# Patient Record
Sex: Female | Born: 1940 | Race: White | Hispanic: No | Smoking: Never smoker
Health system: Southern US, Community
[De-identification: ages and names within clinical notes are randomized; demographics above are authoritative.]

## PROBLEM LIST (undated history)

## (undated) DIAGNOSIS — K5900 Constipation, unspecified: Secondary | ICD-10-CM

## (undated) DIAGNOSIS — R319 Hematuria, unspecified: Secondary | ICD-10-CM

## (undated) DIAGNOSIS — C801 Malignant (primary) neoplasm, unspecified: Secondary | ICD-10-CM

## (undated) DIAGNOSIS — N390 Urinary tract infection, site not specified: Secondary | ICD-10-CM

## (undated) DIAGNOSIS — K317 Polyp of stomach and duodenum: Secondary | ICD-10-CM

## (undated) DIAGNOSIS — R51 Headache: Secondary | ICD-10-CM

## (undated) DIAGNOSIS — K219 Gastro-esophageal reflux disease without esophagitis: Secondary | ICD-10-CM

## (undated) DIAGNOSIS — I471 Supraventricular tachycardia, unspecified: Secondary | ICD-10-CM

## (undated) DIAGNOSIS — K3184 Gastroparesis: Secondary | ICD-10-CM

## (undated) DIAGNOSIS — E559 Vitamin D deficiency, unspecified: Secondary | ICD-10-CM

## (undated) DIAGNOSIS — F419 Anxiety disorder, unspecified: Secondary | ICD-10-CM

## (undated) DIAGNOSIS — I639 Cerebral infarction, unspecified: Secondary | ICD-10-CM

## (undated) DIAGNOSIS — R109 Unspecified abdominal pain: Secondary | ICD-10-CM

## (undated) DIAGNOSIS — D509 Iron deficiency anemia, unspecified: Secondary | ICD-10-CM

## (undated) DIAGNOSIS — E1169 Type 2 diabetes mellitus with other specified complication: Secondary | ICD-10-CM

## (undated) DIAGNOSIS — C50919 Malignant neoplasm of unspecified site of unspecified female breast: Secondary | ICD-10-CM

## (undated) DIAGNOSIS — I10 Essential (primary) hypertension: Secondary | ICD-10-CM

## (undated) DIAGNOSIS — E7800 Pure hypercholesterolemia, unspecified: Secondary | ICD-10-CM

## (undated) DIAGNOSIS — M26609 Unspecified temporomandibular joint disorder, unspecified side: Secondary | ICD-10-CM

## (undated) DIAGNOSIS — R112 Nausea with vomiting, unspecified: Secondary | ICD-10-CM

## (undated) DIAGNOSIS — R011 Cardiac murmur, unspecified: Secondary | ICD-10-CM

## (undated) DIAGNOSIS — M542 Cervicalgia: Secondary | ICD-10-CM

## (undated) DIAGNOSIS — K909 Intestinal malabsorption, unspecified: Secondary | ICD-10-CM

## (undated) DIAGNOSIS — Z9889 Other specified postprocedural states: Secondary | ICD-10-CM

## (undated) DIAGNOSIS — N189 Chronic kidney disease, unspecified: Secondary | ICD-10-CM

## (undated) DIAGNOSIS — Z0001 Encounter for general adult medical examination with abnormal findings: Secondary | ICD-10-CM

## (undated) DIAGNOSIS — D3A092 Benign carcinoid tumor of the stomach: Secondary | ICD-10-CM

## (undated) DIAGNOSIS — M7989 Other specified soft tissue disorders: Secondary | ICD-10-CM

## (undated) DIAGNOSIS — D649 Anemia, unspecified: Secondary | ICD-10-CM

## (undated) DIAGNOSIS — E119 Type 2 diabetes mellitus without complications: Secondary | ICD-10-CM

## (undated) DIAGNOSIS — G609 Hereditary and idiopathic neuropathy, unspecified: Secondary | ICD-10-CM

## (undated) DIAGNOSIS — E669 Obesity, unspecified: Secondary | ICD-10-CM

## (undated) DIAGNOSIS — E1142 Type 2 diabetes mellitus with diabetic polyneuropathy: Secondary | ICD-10-CM

## (undated) DIAGNOSIS — M1990 Unspecified osteoarthritis, unspecified site: Secondary | ICD-10-CM

## (undated) HISTORY — DX: Type 2 diabetes mellitus without complications: E11.9

## (undated) HISTORY — DX: Headache: R51

## (undated) HISTORY — DX: Encounter for general adult medical examination with abnormal findings: Z00.01

## (undated) HISTORY — DX: Pure hypercholesterolemia, unspecified: E78.00

## (undated) HISTORY — DX: Gastro-esophageal reflux disease without esophagitis: K21.9

## (undated) HISTORY — DX: Polyp of stomach and duodenum: K31.7

## (undated) HISTORY — DX: Vitamin D deficiency, unspecified: E55.9

## (undated) HISTORY — DX: Anxiety disorder, unspecified: F41.9

## (undated) HISTORY — DX: Supraventricular tachycardia, unspecified: I47.10

## (undated) HISTORY — DX: Unspecified temporomandibular joint disorder, unspecified side: M26.609

## (undated) HISTORY — DX: Obesity, unspecified: E66.9

## (undated) HISTORY — DX: Unspecified osteoarthritis, unspecified site: M19.90

## (undated) HISTORY — DX: Anemia, unspecified: D64.9

## (undated) HISTORY — PX: ESOPHAGOGASTRODUODENOSCOPY: SHX1529

## (undated) HISTORY — DX: Iron deficiency anemia, unspecified: D50.9

## (undated) HISTORY — DX: Cardiac murmur, unspecified: R01.1

## (undated) HISTORY — DX: Cervicalgia: M54.2

## (undated) HISTORY — PX: TONSILLECTOMY: SHX5217

## (undated) HISTORY — DX: Benign carcinoid tumor of the stomach: D3A.092

## (undated) HISTORY — DX: Constipation, unspecified: K59.00

## (undated) HISTORY — DX: Essential (primary) hypertension: I10

## (undated) HISTORY — DX: Supraventricular tachycardia: I47.1

## (undated) HISTORY — DX: Cerebral infarction, unspecified: I63.9

## (undated) HISTORY — DX: Hematuria, unspecified: R31.9

## (undated) HISTORY — DX: Gastroparesis: K31.84

## (undated) HISTORY — DX: Malignant neoplasm of unspecified site of unspecified female breast: C50.919

## (undated) HISTORY — PX: EYE SURGERY: SHX253

## (undated) HISTORY — DX: Urinary tract infection, site not specified: N39.0

## (undated) HISTORY — DX: Type 2 diabetes mellitus with diabetic polyneuropathy: E11.42

## (undated) HISTORY — DX: Unspecified abdominal pain: R10.9

## (undated) HISTORY — DX: Type 2 diabetes mellitus with other specified complication: E11.69

## (undated) HISTORY — DX: Hereditary and idiopathic neuropathy, unspecified: G60.9

## (undated) HISTORY — DX: Intestinal malabsorption, unspecified: K90.9

---

## 1991-02-07 HISTORY — PX: CHOLECYSTECTOMY: SHX55

## 2000-08-30 ENCOUNTER — Encounter: Payer: Self-pay | Admitting: Internal Medicine

## 2000-08-30 LAB — HM COLONOSCOPY

## 2000-08-31 ENCOUNTER — Encounter: Payer: Self-pay | Admitting: Internal Medicine

## 2003-09-03 ENCOUNTER — Encounter: Admission: RE | Admit: 2003-09-03 | Discharge: 2003-09-03 | Payer: Self-pay | Admitting: Obstetrics and Gynecology

## 2003-12-14 ENCOUNTER — Ambulatory Visit: Payer: Self-pay | Admitting: Internal Medicine

## 2004-01-28 ENCOUNTER — Ambulatory Visit: Payer: Self-pay | Admitting: Endocrinology

## 2004-02-11 ENCOUNTER — Encounter: Admission: RE | Admit: 2004-02-11 | Discharge: 2004-02-11 | Payer: Self-pay | Admitting: Obstetrics and Gynecology

## 2004-02-18 ENCOUNTER — Other Ambulatory Visit: Admission: RE | Admit: 2004-02-18 | Discharge: 2004-02-18 | Payer: Self-pay | Admitting: Obstetrics and Gynecology

## 2004-03-30 ENCOUNTER — Ambulatory Visit: Payer: Self-pay | Admitting: Internal Medicine

## 2004-04-14 ENCOUNTER — Ambulatory Visit: Payer: Self-pay | Admitting: Internal Medicine

## 2004-06-16 ENCOUNTER — Ambulatory Visit: Payer: Self-pay | Admitting: Endocrinology

## 2004-07-01 ENCOUNTER — Ambulatory Visit: Payer: Self-pay | Admitting: Internal Medicine

## 2004-07-21 ENCOUNTER — Ambulatory Visit: Payer: Self-pay | Admitting: Internal Medicine

## 2005-01-13 ENCOUNTER — Ambulatory Visit: Payer: Self-pay | Admitting: Endocrinology

## 2005-02-16 ENCOUNTER — Encounter: Admission: RE | Admit: 2005-02-16 | Discharge: 2005-02-16 | Payer: Self-pay | Admitting: Internal Medicine

## 2005-02-24 ENCOUNTER — Ambulatory Visit: Payer: Self-pay | Admitting: Endocrinology

## 2005-03-02 ENCOUNTER — Ambulatory Visit: Payer: Self-pay | Admitting: Endocrinology

## 2005-03-23 ENCOUNTER — Ambulatory Visit: Payer: Self-pay

## 2005-04-07 ENCOUNTER — Other Ambulatory Visit: Admission: RE | Admit: 2005-04-07 | Discharge: 2005-04-07 | Payer: Self-pay | Admitting: Obstetrics and Gynecology

## 2005-05-25 ENCOUNTER — Ambulatory Visit: Payer: Self-pay | Admitting: Endocrinology

## 2005-06-01 ENCOUNTER — Ambulatory Visit: Payer: Self-pay | Admitting: Endocrinology

## 2005-06-08 ENCOUNTER — Ambulatory Visit: Payer: Self-pay | Admitting: Internal Medicine

## 2005-10-10 ENCOUNTER — Ambulatory Visit: Payer: Self-pay | Admitting: Endocrinology

## 2005-10-16 ENCOUNTER — Encounter: Admission: RE | Admit: 2005-10-16 | Discharge: 2005-10-16 | Payer: Self-pay | Admitting: Endocrinology

## 2005-11-16 ENCOUNTER — Ambulatory Visit: Payer: Self-pay | Admitting: Endocrinology

## 2005-11-16 LAB — CONVERTED CEMR LAB
Cholesterol: 183 mg/dL (ref 0–200)
LDL Cholesterol: 110 mg/dL — ABNORMAL HIGH (ref 0–99)

## 2005-11-23 ENCOUNTER — Ambulatory Visit: Payer: Self-pay | Admitting: Endocrinology

## 2005-12-05 ENCOUNTER — Ambulatory Visit: Payer: Self-pay

## 2006-02-19 ENCOUNTER — Encounter: Admission: RE | Admit: 2006-02-19 | Discharge: 2006-02-19 | Payer: Self-pay | Admitting: Endocrinology

## 2006-02-23 ENCOUNTER — Ambulatory Visit: Payer: Self-pay | Admitting: Endocrinology

## 2006-02-23 LAB — CONVERTED CEMR LAB
Albumin: 3.6 g/dL (ref 3.5–5.2)
Alkaline Phosphatase: 60 units/L (ref 39–117)
Cholesterol: 180 mg/dL (ref 0–200)
Creatinine, Ser: 0.6 mg/dL (ref 0.4–1.2)
Crystals: NEGATIVE
Eosinophils Relative: 1.7 % (ref 0.0–5.0)
GFR calc non Af Amer: 107 mL/min
Glucose, Bld: 134 mg/dL — ABNORMAL HIGH (ref 70–99)
HCT: 39.3 % (ref 36.0–46.0)
Hemoglobin: 12.7 g/dL (ref 12.0–15.0)
Ketones, ur: NEGATIVE mg/dL
LDL Cholesterol: 98 mg/dL (ref 0–99)
Lymphocytes Relative: 32.2 % (ref 12.0–46.0)
MCV: 83.7 fL (ref 78.0–100.0)
Monocytes Absolute: 0.3 10*3/uL (ref 0.2–0.7)
Mucus, UA: NEGATIVE
Neutrophils Relative %: 61.1 % (ref 43.0–77.0)
Total Protein: 6.6 g/dL (ref 6.0–8.3)
Urine Glucose: NEGATIVE mg/dL
VLDL: 18 mg/dL (ref 0–40)
WBC: 6.7 10*3/uL (ref 4.5–10.5)
pH: 8 (ref 5.0–8.0)

## 2006-03-02 ENCOUNTER — Ambulatory Visit: Payer: Self-pay | Admitting: Endocrinology

## 2006-03-28 ENCOUNTER — Ambulatory Visit: Payer: Self-pay | Admitting: Endocrinology

## 2006-03-30 ENCOUNTER — Encounter
Admission: RE | Admit: 2006-03-30 | Discharge: 2006-06-28 | Payer: Self-pay | Admitting: Physical Medicine & Rehabilitation

## 2006-03-30 ENCOUNTER — Ambulatory Visit: Payer: Self-pay | Admitting: Physical Medicine & Rehabilitation

## 2006-04-10 ENCOUNTER — Encounter
Admission: RE | Admit: 2006-04-10 | Discharge: 2006-06-28 | Payer: Self-pay | Admitting: Physical Medicine & Rehabilitation

## 2006-05-11 ENCOUNTER — Ambulatory Visit: Payer: Self-pay | Admitting: Endocrinology

## 2006-05-11 LAB — CONVERTED CEMR LAB
ALT: 32 units/L (ref 0–40)
AST: 28 units/L (ref 0–37)
Alkaline Phosphatase: 55 units/L (ref 39–117)
Direct LDL: 195.3 mg/dL
Hgb A1c MFr Bld: 6.5 % — ABNORMAL HIGH (ref 4.6–6.0)
Total CHOL/HDL Ratio: 4.4
Total Protein: 6.9 g/dL (ref 6.0–8.3)

## 2006-05-24 ENCOUNTER — Ambulatory Visit: Payer: Self-pay | Admitting: Endocrinology

## 2006-07-18 ENCOUNTER — Encounter: Admission: RE | Admit: 2006-07-18 | Discharge: 2006-07-18 | Payer: Self-pay | Admitting: Orthopedic Surgery

## 2006-08-20 ENCOUNTER — Ambulatory Visit: Payer: Self-pay | Admitting: Endocrinology

## 2006-08-20 LAB — CONVERTED CEMR LAB
Albumin: 3.5 g/dL (ref 3.5–5.2)
Alkaline Phosphatase: 56 units/L (ref 39–117)
Bilirubin, Direct: 0.2 mg/dL (ref 0.0–0.3)
HDL: 63.8 mg/dL (ref >39.0)
Hgb A1c MFr Bld: 6.5 % — ABNORMAL HIGH (ref 4.6–6.0)
Total CHOL/HDL Ratio: 2.9
Total Protein: 6.6 g/dL (ref 6.0–8.3)
VLDL: 21 mg/dL (ref 0–40)

## 2006-08-27 ENCOUNTER — Ambulatory Visit: Payer: Self-pay | Admitting: Endocrinology

## 2006-09-05 ENCOUNTER — Encounter: Payer: Self-pay | Admitting: Endocrinology

## 2006-09-05 DIAGNOSIS — E1169 Type 2 diabetes mellitus with other specified complication: Secondary | ICD-10-CM

## 2006-09-05 DIAGNOSIS — I10 Essential (primary) hypertension: Secondary | ICD-10-CM | POA: Insufficient documentation

## 2006-09-05 DIAGNOSIS — E669 Obesity, unspecified: Secondary | ICD-10-CM | POA: Insufficient documentation

## 2006-09-05 DIAGNOSIS — E119 Type 2 diabetes mellitus without complications: Secondary | ICD-10-CM | POA: Insufficient documentation

## 2006-09-05 HISTORY — DX: Type 2 diabetes mellitus with other specified complication: E66.9

## 2006-09-05 HISTORY — DX: Type 2 diabetes mellitus with other specified complication: E11.69

## 2006-11-13 ENCOUNTER — Ambulatory Visit: Payer: Self-pay | Admitting: Endocrinology

## 2006-11-13 LAB — CONVERTED CEMR LAB: Hgb A1c MFr Bld: 6.5 % — ABNORMAL HIGH (ref 4.6–6.0)

## 2006-11-19 ENCOUNTER — Ambulatory Visit: Payer: Self-pay | Admitting: Endocrinology

## 2006-11-26 ENCOUNTER — Telehealth (INDEPENDENT_AMBULATORY_CARE_PROVIDER_SITE_OTHER): Payer: Self-pay | Admitting: *Deleted

## 2006-12-05 ENCOUNTER — Telehealth (INDEPENDENT_AMBULATORY_CARE_PROVIDER_SITE_OTHER): Payer: Self-pay | Admitting: *Deleted

## 2007-03-04 ENCOUNTER — Ambulatory Visit: Payer: Self-pay | Admitting: Endocrinology

## 2007-03-04 LAB — CONVERTED CEMR LAB
AST: 25 units/L (ref 0–37)
Bilirubin, Direct: 0.2 mg/dL (ref 0.0–0.3)
CO2: 30 meq/L (ref 19–32)
Calcium: 9.6 mg/dL (ref 8.4–10.5)
Chloride: 100 meq/L (ref 96–112)
Creatinine,U: 119.1 mg/dL
Eosinophils Absolute: 0.1 10*3/uL (ref 0.0–0.6)
Eosinophils Relative: 1.3 % (ref 0.0–5.0)
GFR calc Af Amer: 129 mL/min
GFR calc non Af Amer: 107 mL/min
Glucose, Bld: 126 mg/dL — ABNORMAL HIGH (ref 70–99)
HCT: 36.7 % (ref 36.0–46.0)
HDL: 60.3 mg/dL (ref >39.0)
Hemoglobin: 12.2 g/dL (ref 12.0–15.0)
LDL Cholesterol: 122 mg/dL — ABNORMAL HIGH (ref 0–99)
Lymphocytes Relative: 17.7 % (ref 12.0–46.0)
MCV: 80.8 fL (ref 78.0–100.0)
Monocytes Absolute: 0.8 10*3/uL — ABNORMAL HIGH (ref 0.2–0.7)
Monocytes Relative: 7 % (ref 3.0–11.0)
Neutrophils Relative %: 73.7 % (ref 43.0–77.0)
Platelets: 225 10*3/uL (ref 150–400)
RBC: 4.54 M/uL (ref 3.87–5.11)
Sodium: 138 meq/L (ref 135–145)
Total Bilirubin: 1.2 mg/dL (ref 0.3–1.2)
Total Protein: 6.6 g/dL (ref 6.0–8.3)
Triglycerides: 68 mg/dL (ref 0–149)
VLDL: 14 mg/dL (ref 0–40)
WBC: 11.3 10*3/uL — ABNORMAL HIGH (ref 4.5–10.5)

## 2007-03-07 ENCOUNTER — Encounter: Admission: RE | Admit: 2007-03-07 | Discharge: 2007-03-07 | Payer: Self-pay | Admitting: Obstetrics and Gynecology

## 2007-03-13 ENCOUNTER — Encounter: Admission: RE | Admit: 2007-03-13 | Discharge: 2007-03-13 | Payer: Self-pay | Admitting: Obstetrics and Gynecology

## 2007-03-13 ENCOUNTER — Ambulatory Visit: Payer: Self-pay | Admitting: Endocrinology

## 2007-04-01 ENCOUNTER — Encounter: Payer: Self-pay | Admitting: Endocrinology

## 2007-04-16 ENCOUNTER — Ambulatory Visit: Payer: Self-pay | Admitting: Internal Medicine

## 2007-04-24 ENCOUNTER — Encounter: Payer: Self-pay | Admitting: Endocrinology

## 2007-04-24 ENCOUNTER — Telehealth (INDEPENDENT_AMBULATORY_CARE_PROVIDER_SITE_OTHER): Payer: Self-pay | Admitting: *Deleted

## 2007-05-03 ENCOUNTER — Telehealth (INDEPENDENT_AMBULATORY_CARE_PROVIDER_SITE_OTHER): Payer: Self-pay | Admitting: *Deleted

## 2007-06-11 ENCOUNTER — Ambulatory Visit: Payer: Self-pay | Admitting: Endocrinology

## 2007-06-11 DIAGNOSIS — E7800 Pure hypercholesterolemia, unspecified: Secondary | ICD-10-CM

## 2007-06-11 DIAGNOSIS — R519 Headache, unspecified: Secondary | ICD-10-CM | POA: Insufficient documentation

## 2007-06-11 DIAGNOSIS — R51 Headache: Secondary | ICD-10-CM

## 2007-06-12 LAB — CONVERTED CEMR LAB
ALT: 26 units/L (ref 0–35)
AST: 35 units/L (ref 0–37)
Albumin: 3.7 g/dL (ref 3.5–5.2)
Alkaline Phosphatase: 58 units/L (ref 39–117)
Cholesterol: 184 mg/dL (ref 0–200)
Total Bilirubin: 1.1 mg/dL (ref 0.3–1.2)

## 2007-10-28 ENCOUNTER — Telehealth (INDEPENDENT_AMBULATORY_CARE_PROVIDER_SITE_OTHER): Payer: Self-pay | Admitting: *Deleted

## 2007-11-04 ENCOUNTER — Telehealth (INDEPENDENT_AMBULATORY_CARE_PROVIDER_SITE_OTHER): Payer: Self-pay | Admitting: *Deleted

## 2007-11-19 ENCOUNTER — Ambulatory Visit: Payer: Self-pay | Admitting: Endocrinology

## 2007-12-10 ENCOUNTER — Ambulatory Visit: Payer: Self-pay | Admitting: Endocrinology

## 2007-12-10 DIAGNOSIS — R110 Nausea: Secondary | ICD-10-CM | POA: Insufficient documentation

## 2007-12-13 LAB — CONVERTED CEMR LAB
Albumin: 3.9 g/dL (ref 3.5–5.2)
Amylase: 122 units/L (ref 27–131)
Basophils Absolute: 0 10*3/uL (ref 0.0–0.1)
Bilirubin, Direct: 0.2 mg/dL (ref 0.0–0.3)
CO2: 28 meq/L (ref 19–32)
Chloride: 102 meq/L (ref 96–112)
Creatinine, Ser: 0.6 mg/dL (ref 0.4–1.2)
GFR calc Af Amer: 129 mL/min
GFR calc non Af Amer: 106 mL/min
Glucose, Bld: 78 mg/dL (ref 70–99)
Hemoglobin: 11.7 g/dL — ABNORMAL LOW (ref 12.0–15.0)
Lymphocytes Relative: 34.6 % (ref 12.0–46.0)
MCHC: 32.8 g/dL (ref 30.0–36.0)
MCV: 79.7 fL (ref 78.0–100.0)
Monocytes Relative: 7.8 % (ref 3.0–12.0)
Platelets: 242 10*3/uL (ref 150–400)
Potassium: 3.7 meq/L (ref 3.5–5.1)
Sodium: 139 meq/L (ref 135–145)
Total Bilirubin: 1.1 mg/dL (ref 0.3–1.2)
Total Protein: 7.2 g/dL (ref 6.0–8.3)

## 2007-12-16 ENCOUNTER — Ambulatory Visit: Payer: Self-pay | Admitting: Endocrinology

## 2007-12-16 DIAGNOSIS — D649 Anemia, unspecified: Secondary | ICD-10-CM

## 2007-12-16 DIAGNOSIS — M79609 Pain in unspecified limb: Secondary | ICD-10-CM

## 2007-12-16 LAB — CONVERTED CEMR LAB
Eosinophils Relative: 1.6 % (ref 0.0–5.0)
Folate: >20 ng/mL
Hemoglobin: 11.9 g/dL — ABNORMAL LOW (ref 12.0–15.0)
Lymphocytes Relative: 40.4 % (ref 12.0–46.0)
MCV: 77.8 fL — ABNORMAL LOW (ref 78.0–100.0)
Monocytes Absolute: 0.3 10*3/uL (ref 0.1–1.0)
Neutrophils Relative %: 53.5 % (ref 43.0–77.0)
RDW: 13.8 % (ref 11.5–14.6)
Saturation Ratios: 4 % — ABNORMAL LOW (ref 20.0–50.0)
Transferrin: 379 mg/dL — ABNORMAL HIGH (ref >=212.0)

## 2007-12-17 ENCOUNTER — Encounter: Payer: Self-pay | Admitting: Endocrinology

## 2007-12-18 ENCOUNTER — Ambulatory Visit: Payer: Self-pay | Admitting: Internal Medicine

## 2007-12-18 DIAGNOSIS — D509 Iron deficiency anemia, unspecified: Secondary | ICD-10-CM

## 2007-12-18 DIAGNOSIS — K3184 Gastroparesis: Secondary | ICD-10-CM

## 2007-12-19 ENCOUNTER — Telehealth: Payer: Self-pay | Admitting: Internal Medicine

## 2007-12-20 ENCOUNTER — Ambulatory Visit: Payer: Self-pay | Admitting: Internal Medicine

## 2007-12-24 ENCOUNTER — Encounter: Payer: Self-pay | Admitting: Endocrinology

## 2008-03-09 ENCOUNTER — Ambulatory Visit: Payer: Self-pay | Admitting: Endocrinology

## 2008-03-09 LAB — CONVERTED CEMR LAB
Basophils Absolute: 0.1 10*3/uL (ref 0.0–0.1)
Eosinophils Absolute: 0.1 10*3/uL (ref 0.0–0.7)
Hemoglobin: 12.1 g/dL (ref 12.0–15.0)
Hgb A1c MFr Bld: 7 % — ABNORMAL HIGH (ref 4.6–6.0)
Iron: 28 ug/dL — ABNORMAL LOW (ref 42–145)
Lymphocytes Relative: 30.3 % (ref 12.0–46.0)
MCHC: 33.2 g/dL (ref 30.0–36.0)
Neutro Abs: 4.7 10*3/uL (ref 1.4–7.7)
Platelets: 271 10*3/uL (ref 150–400)
RDW: 14.1 % (ref 11.5–14.6)
WBC: 7.9 10*3/uL (ref 4.5–10.5)

## 2008-03-16 ENCOUNTER — Encounter: Admission: RE | Admit: 2008-03-16 | Discharge: 2008-03-16 | Payer: Self-pay | Admitting: Obstetrics and Gynecology

## 2008-05-12 ENCOUNTER — Encounter: Payer: Self-pay | Admitting: Endocrinology

## 2008-05-18 ENCOUNTER — Encounter: Payer: Self-pay | Admitting: Cardiology

## 2008-05-18 ENCOUNTER — Ambulatory Visit: Payer: Self-pay | Admitting: Cardiology

## 2008-05-18 DIAGNOSIS — R079 Chest pain, unspecified: Secondary | ICD-10-CM

## 2008-05-22 ENCOUNTER — Telehealth: Payer: Self-pay | Admitting: Endocrinology

## 2008-06-08 ENCOUNTER — Ambulatory Visit: Payer: Self-pay | Admitting: Endocrinology

## 2008-06-08 DIAGNOSIS — Z780 Asymptomatic menopausal state: Secondary | ICD-10-CM | POA: Insufficient documentation

## 2008-06-08 DIAGNOSIS — M722 Plantar fascial fibromatosis: Secondary | ICD-10-CM | POA: Insufficient documentation

## 2008-06-09 LAB — CONVERTED CEMR LAB: Sed Rate: 19 mm/hr (ref 0–22)

## 2008-06-11 ENCOUNTER — Telehealth (INDEPENDENT_AMBULATORY_CARE_PROVIDER_SITE_OTHER): Payer: Self-pay | Admitting: *Deleted

## 2008-06-14 LAB — CONVERTED CEMR LAB
ALT: 31 units/L (ref 0–35)
Albumin: 3.8 g/dL (ref 3.5–5.2)
BUN: 15 mg/dL (ref 6–23)
Basophils Relative: 1.3 % (ref 0.0–3.0)
Bilirubin Urine: NEGATIVE
Creatinine,U: 27.2 mg/dL
Eosinophils Absolute: 0.1 10*3/uL (ref 0.0–0.7)
Eosinophils Relative: 1.6 % (ref 0.0–5.0)
GFR calc non Af Amer: 105.92 mL/min (ref >60)
HCT: 34.1 % — ABNORMAL LOW (ref 36.0–46.0)
HDL: 64.4 mg/dL (ref >39.00)
Hemoglobin, Urine: NEGATIVE
Hemoglobin: 11.4 g/dL — ABNORMAL LOW (ref 12.0–15.0)
Iron: 26 ug/dL — ABNORMAL LOW (ref 42–145)
Leukocytes, UA: NEGATIVE
Lymphs Abs: 2.6 10*3/uL (ref 0.7–4.0)
MCHC: 33.4 g/dL (ref 30.0–36.0)
Microalb, Ur: 0.2 mg/dL (ref 0.0–1.9)
Monocytes Absolute: 0.7 10*3/uL (ref 0.1–1.0)
Monocytes Relative: 9.9 % (ref 3.0–12.0)
Platelets: 224 10*3/uL (ref 150.0–400.0)
Potassium: 4.4 meq/L (ref 3.5–5.1)
RBC: 4.43 M/uL (ref 3.87–5.11)
RDW: 14.7 % — ABNORMAL HIGH (ref 11.5–14.6)
Specific Gravity, Urine: <=1.005 (ref 1.000–1.030)
TSH: 1.8 microintl units/mL (ref 0.35–5.50)
Total Protein, Urine: NEGATIVE mg/dL
Total Protein: 7.2 g/dL (ref 6.0–8.3)
Transferrin: 382.1 mg/dL — ABNORMAL HIGH (ref 212.0–360.0)
Urine Glucose: NEGATIVE mg/dL
VLDL: 26.8 mg/dL (ref 0.0–40.0)

## 2008-06-17 ENCOUNTER — Telehealth: Payer: Self-pay | Admitting: Endocrinology

## 2008-06-17 ENCOUNTER — Telehealth (INDEPENDENT_AMBULATORY_CARE_PROVIDER_SITE_OTHER): Payer: Self-pay | Admitting: *Deleted

## 2008-06-18 ENCOUNTER — Encounter (INDEPENDENT_AMBULATORY_CARE_PROVIDER_SITE_OTHER): Payer: Self-pay | Admitting: *Deleted

## 2008-09-10 ENCOUNTER — Encounter: Payer: Self-pay | Admitting: Endocrinology

## 2008-09-14 ENCOUNTER — Ambulatory Visit: Payer: Self-pay | Admitting: Endocrinology

## 2008-09-14 DIAGNOSIS — R609 Edema, unspecified: Secondary | ICD-10-CM

## 2008-09-14 LAB — CONVERTED CEMR LAB
Basophils Relative: 7 % — ABNORMAL HIGH (ref 0.0–3.0)
Eosinophils Relative: 2.2 % (ref 0.0–5.0)
Lymphocytes Relative: 33.8 % (ref 12.0–46.0)
MCHC: 33.3 g/dL (ref 30.0–36.0)
Monocytes Relative: 9.5 % (ref 3.0–12.0)
Neutro Abs: 3.8 10*3/uL (ref 1.4–7.7)
Neutrophils Relative %: 47.5 % (ref 43.0–77.0)
Pro B Natriuretic peptide (BNP): 34 pg/mL (ref 0.0–100.0)
RBC: 4.32 M/uL (ref 3.87–5.11)
Transferrin: 340.9 mg/dL (ref 212.0–360.0)

## 2008-10-05 ENCOUNTER — Telehealth (INDEPENDENT_AMBULATORY_CARE_PROVIDER_SITE_OTHER): Payer: Self-pay | Admitting: *Deleted

## 2008-10-07 ENCOUNTER — Encounter: Payer: Self-pay | Admitting: Endocrinology

## 2008-11-04 ENCOUNTER — Ambulatory Visit: Payer: Self-pay | Admitting: Endocrinology

## 2008-12-07 ENCOUNTER — Telehealth: Payer: Self-pay | Admitting: Endocrinology

## 2008-12-09 ENCOUNTER — Telehealth: Payer: Self-pay | Admitting: Endocrinology

## 2008-12-14 ENCOUNTER — Ambulatory Visit: Payer: Self-pay | Admitting: Endocrinology

## 2008-12-14 LAB — CONVERTED CEMR LAB
Eosinophils Absolute: 0.1 10*3/uL (ref 0.0–0.7)
Eosinophils Relative: 1.7 % (ref 0.0–5.0)
Folate: >20 ng/mL
HCT: 37.2 % (ref 36.0–46.0)
Lymphocytes Relative: 35.5 % (ref 12.0–46.0)
Lymphs Abs: 3 10*3/uL (ref 0.7–4.0)
MCHC: 34.2 g/dL (ref 30.0–36.0)
MCV: 82.5 fL (ref 78.0–100.0)
Monocytes Absolute: 0.6 10*3/uL (ref 0.1–1.0)
Monocytes Relative: 7.5 % (ref 3.0–12.0)
Neutro Abs: 4.7 10*3/uL (ref 1.4–7.7)
Neutrophils Relative %: 54.7 % (ref 43.0–77.0)
RBC: 4.5 M/uL (ref 3.87–5.11)
RDW: 13.6 % (ref 11.5–14.6)
Transferrin: 371.5 mg/dL — ABNORMAL HIGH (ref 212.0–360.0)

## 2008-12-25 ENCOUNTER — Telehealth (INDEPENDENT_AMBULATORY_CARE_PROVIDER_SITE_OTHER): Payer: Self-pay | Admitting: *Deleted

## 2009-01-08 ENCOUNTER — Telehealth: Payer: Self-pay | Admitting: Endocrinology

## 2009-01-10 ENCOUNTER — Encounter: Payer: Self-pay | Admitting: Cardiology

## 2009-01-10 ENCOUNTER — Emergency Department (HOSPITAL_COMMUNITY): Admission: EM | Admit: 2009-01-10 | Discharge: 2009-01-11 | Payer: Self-pay | Admitting: Emergency Medicine

## 2009-01-10 ENCOUNTER — Encounter: Payer: Self-pay | Admitting: Endocrinology

## 2009-01-11 ENCOUNTER — Telehealth: Payer: Self-pay | Admitting: Cardiology

## 2009-01-12 ENCOUNTER — Ambulatory Visit: Payer: Self-pay | Admitting: Endocrinology

## 2009-01-12 ENCOUNTER — Telehealth (INDEPENDENT_AMBULATORY_CARE_PROVIDER_SITE_OTHER): Payer: Self-pay | Admitting: *Deleted

## 2009-01-12 DIAGNOSIS — I471 Supraventricular tachycardia: Secondary | ICD-10-CM | POA: Insufficient documentation

## 2009-01-21 ENCOUNTER — Telehealth: Payer: Self-pay | Admitting: Endocrinology

## 2009-02-23 ENCOUNTER — Telehealth (INDEPENDENT_AMBULATORY_CARE_PROVIDER_SITE_OTHER): Payer: Self-pay | Admitting: *Deleted

## 2009-02-25 ENCOUNTER — Encounter (HOSPITAL_COMMUNITY): Admission: RE | Admit: 2009-02-25 | Discharge: 2009-05-14 | Payer: Self-pay | Admitting: Endocrinology

## 2009-02-25 ENCOUNTER — Ambulatory Visit: Payer: Self-pay | Admitting: Cardiovascular Disease

## 2009-02-25 ENCOUNTER — Ambulatory Visit: Payer: Self-pay

## 2009-03-02 ENCOUNTER — Ambulatory Visit: Payer: Self-pay | Admitting: Cardiology

## 2009-03-02 DIAGNOSIS — R011 Cardiac murmur, unspecified: Secondary | ICD-10-CM

## 2009-03-09 ENCOUNTER — Encounter: Payer: Self-pay | Admitting: Endocrinology

## 2009-03-15 ENCOUNTER — Encounter: Payer: Self-pay | Admitting: Endocrinology

## 2009-03-17 ENCOUNTER — Ambulatory Visit: Payer: Self-pay | Admitting: Endocrinology

## 2009-03-17 DIAGNOSIS — M2550 Pain in unspecified joint: Secondary | ICD-10-CM

## 2009-03-17 DIAGNOSIS — M25552 Pain in left hip: Secondary | ICD-10-CM | POA: Insufficient documentation

## 2009-03-17 LAB — CONVERTED CEMR LAB
Hgb A1c MFr Bld: 6.8 % — ABNORMAL HIGH (ref 4.6–6.5)
Rhuematoid fact SerPl-aCnc: <20 intl units/mL (ref 0.0–20.0)
Uric Acid, Serum: 4.4 mg/dL (ref 2.4–7.0)

## 2009-03-18 ENCOUNTER — Ambulatory Visit (HOSPITAL_COMMUNITY): Admission: RE | Admit: 2009-03-18 | Discharge: 2009-03-18 | Payer: Self-pay | Admitting: Cardiology

## 2009-03-18 ENCOUNTER — Ambulatory Visit: Payer: Self-pay | Admitting: Cardiology

## 2009-03-18 ENCOUNTER — Encounter: Payer: Self-pay | Admitting: Cardiology

## 2009-03-18 ENCOUNTER — Ambulatory Visit: Payer: Self-pay

## 2009-03-24 ENCOUNTER — Encounter: Admission: RE | Admit: 2009-03-24 | Discharge: 2009-03-24 | Payer: Self-pay | Admitting: Obstetrics and Gynecology

## 2009-05-20 ENCOUNTER — Encounter: Payer: Self-pay | Admitting: Endocrinology

## 2009-06-28 ENCOUNTER — Ambulatory Visit: Payer: Self-pay | Admitting: Endocrinology

## 2009-06-28 LAB — CONVERTED CEMR LAB
AST: 31 units/L (ref 0–37)
Alkaline Phosphatase: 57 units/L (ref 39–117)
Basophils Absolute: 0 10*3/uL (ref 0.0–0.1)
Basophils Relative: 0.5 % (ref 0.0–3.0)
Chloride: 96 meq/L (ref 96–112)
Cholesterol: 173 mg/dL (ref 0–200)
Creatinine,U: 37.1 mg/dL
Eosinophils Absolute: 0.1 10*3/uL (ref 0.0–0.7)
Eosinophils Relative: 1.1 % (ref 0.0–5.0)
Glucose, Bld: 89 mg/dL (ref 70–99)
Hgb A1c MFr Bld: 6.6 % — ABNORMAL HIGH (ref 4.6–6.5)
Ketones, ur: NEGATIVE mg/dL
Lymphocytes Relative: 34.3 % (ref 12.0–46.0)
MCV: 82.5 fL (ref 78.0–100.0)
Microalb Creat Ratio: 0.8 mg/g (ref 0.0–30.0)
Neutrophils Relative %: 54.9 % (ref 43.0–77.0)
Nitrite: NEGATIVE
Potassium: 3.8 meq/L (ref 3.5–5.1)
RBC: 4.24 M/uL (ref 3.87–5.11)
Saturation Ratios: 9.1 % — ABNORMAL LOW (ref 20.0–50.0)
Specific Gravity, Urine: 1.01 (ref 1.000–1.030)
TSH: 1.13 microintl units/mL (ref 0.35–5.50)
Total Protein, Urine: NEGATIVE mg/dL
Total Protein: 6.8 g/dL (ref 6.0–8.3)
Triglycerides: 98 mg/dL (ref 0.0–149.0)
Urine Glucose: NEGATIVE mg/dL

## 2009-09-01 ENCOUNTER — Encounter: Payer: Self-pay | Admitting: Cardiology

## 2009-09-16 ENCOUNTER — Ambulatory Visit: Payer: Self-pay | Admitting: Endocrinology

## 2009-09-16 LAB — CONVERTED CEMR LAB
ALT: 35 units/L (ref 0–35)
AST: 37 units/L (ref 0–37)
Basophils Absolute: 0 10*3/uL (ref 0.0–0.1)
Calcium: 9.5 mg/dL (ref 8.4–10.5)
Creatinine, Ser: 0.6 mg/dL (ref 0.4–1.2)
Glucose, Urine, Semiquant: NEGATIVE
Ketones, urine, test strip: NEGATIVE
MCV: 83.7 fL (ref 78.0–100.0)
Monocytes Absolute: 0.6 10*3/uL (ref 0.1–1.0)
Monocytes Relative: 8.8 % (ref 3.0–12.0)
Neutrophils Relative %: 57.9 % (ref 43.0–77.0)
Platelets: 223 10*3/uL (ref 150.0–400.0)
Protein, U semiquant: NEGATIVE
Sodium: 135 meq/L (ref 135–145)
Specific Gravity, Urine: 1.005
Total Bilirubin: 0.8 mg/dL (ref 0.3–1.2)
Transferrin: 335.7 mg/dL (ref 212.0–360.0)
WBC: 6.3 10*3/uL (ref 4.5–10.5)

## 2009-09-17 ENCOUNTER — Ambulatory Visit: Payer: Self-pay | Admitting: Cardiology

## 2009-09-22 ENCOUNTER — Encounter: Admission: RE | Admit: 2009-09-22 | Discharge: 2009-09-22 | Payer: Self-pay | Admitting: Endocrinology

## 2009-10-04 ENCOUNTER — Encounter: Payer: Self-pay | Admitting: Endocrinology

## 2009-11-10 ENCOUNTER — Ambulatory Visit: Payer: Self-pay | Admitting: Endocrinology

## 2009-11-25 ENCOUNTER — Telehealth: Payer: Self-pay | Admitting: Endocrinology

## 2009-11-26 ENCOUNTER — Telehealth: Payer: Self-pay | Admitting: Endocrinology

## 2009-11-29 ENCOUNTER — Telehealth: Payer: Self-pay | Admitting: Endocrinology

## 2009-12-09 ENCOUNTER — Encounter: Payer: Self-pay | Admitting: Endocrinology

## 2009-12-09 ENCOUNTER — Telehealth (INDEPENDENT_AMBULATORY_CARE_PROVIDER_SITE_OTHER): Payer: Self-pay | Admitting: *Deleted

## 2009-12-10 ENCOUNTER — Telehealth: Payer: Self-pay | Admitting: Endocrinology

## 2009-12-16 ENCOUNTER — Ambulatory Visit: Payer: Self-pay | Admitting: Endocrinology

## 2009-12-16 LAB — CONVERTED CEMR LAB: Hgb A1c MFr Bld: 6.9 % — ABNORMAL HIGH (ref 4.6–6.5)

## 2009-12-18 ENCOUNTER — Encounter: Payer: Self-pay | Admitting: Endocrinology

## 2010-01-10 ENCOUNTER — Ambulatory Visit: Payer: Self-pay | Admitting: Endocrinology

## 2010-02-14 ENCOUNTER — Encounter (INDEPENDENT_AMBULATORY_CARE_PROVIDER_SITE_OTHER): Payer: Self-pay | Admitting: *Deleted

## 2010-02-26 ENCOUNTER — Other Ambulatory Visit: Payer: Self-pay | Admitting: Obstetrics and Gynecology

## 2010-02-26 DIAGNOSIS — Z1239 Encounter for other screening for malignant neoplasm of breast: Secondary | ICD-10-CM

## 2010-03-02 ENCOUNTER — Encounter: Payer: Self-pay | Admitting: Endocrinology

## 2010-03-08 ENCOUNTER — Other Ambulatory Visit: Payer: Self-pay | Admitting: Endocrinology

## 2010-03-08 ENCOUNTER — Ambulatory Visit
Admission: RE | Admit: 2010-03-08 | Discharge: 2010-03-08 | Payer: Self-pay | Source: Home / Self Care | Attending: Endocrinology | Admitting: Endocrinology

## 2010-03-08 DIAGNOSIS — R1013 Epigastric pain: Secondary | ICD-10-CM

## 2010-03-08 DIAGNOSIS — K3189 Other diseases of stomach and duodenum: Secondary | ICD-10-CM | POA: Insufficient documentation

## 2010-03-08 LAB — HEPATIC FUNCTION PANEL
AST: 27 U/L (ref 0–37)
Alkaline Phosphatase: 62 U/L (ref 39–117)
Bilirubin, Direct: 0.1 mg/dL (ref 0.0–0.3)
Total Bilirubin: 0.6 mg/dL (ref 0.3–1.2)

## 2010-03-08 LAB — LIPID PANEL
Cholesterol: 150 mg/dL (ref 0–200)
HDL: 57.4 mg/dL (ref >39.00)
LDL Cholesterol: 72 mg/dL (ref 0–99)
Total CHOL/HDL Ratio: 3
Triglycerides: 102 mg/dL (ref 0.0–149.0)
VLDL: 20.4 mg/dL (ref 0.0–40.0)

## 2010-03-08 LAB — CBC WITH DIFFERENTIAL/PLATELET
Basophils Absolute: 0 10*3/uL (ref 0.0–0.1)
Eosinophils Relative: 0.7 % (ref 0.0–5.0)
HCT: 34.3 % — ABNORMAL LOW (ref 36.0–46.0)
Hemoglobin: 11.6 g/dL — ABNORMAL LOW (ref 12.0–15.0)
Lymphocytes Relative: 31.1 % (ref 12.0–46.0)
Lymphs Abs: 2.2 10*3/uL (ref 0.7–4.0)
MCV: 81.6 fl (ref 78.0–100.0)
Monocytes Absolute: 0.7 10*3/uL (ref 0.1–1.0)
Neutrophils Relative %: 57.6 % (ref 43.0–77.0)
Platelets: 215 10*3/uL (ref 150.0–400.0)
RBC: 4.21 Mil/uL (ref 3.87–5.11)
RDW: 14.2 % (ref 11.5–14.6)
WBC: 7 10*3/uL (ref 4.5–10.5)

## 2010-03-08 LAB — HEMOGLOBIN A1C: Hgb A1c MFr Bld: 6.7 % — ABNORMAL HIGH (ref 4.6–6.5)

## 2010-03-09 ENCOUNTER — Telehealth: Payer: Self-pay | Admitting: Endocrinology

## 2010-03-09 NOTE — Miscellaneous (Signed)
Clinical Lists Changes  Observations: Added new observation of ECHOINTERP:  Left ventricle: The cavity size was normal. Wall thickness was       normal. Systolic function was normal. The estimated ejection       fraction was in the range of 60% to 65%. Wall motion was normal;       there were no regional wall motion abnormalities. Doppler       parameters are consistent with abnormal left ventricular       relaxation (grade 1 diastolic dysfunction).     - Aortic valve: Sclerosis without stenosis. Mean gradient: 9mm Hg       (S).     - Mitral valve: Trivial regurgitation.     - Left atrium: The atrium was mildly dilated.     - Right ventricle: The cavity size was normal. Systolic function was       normal.     - Pulmonary arteries: PA peak pressure: 34mm Hg (S).     - Inferior vena cava: The vessel was normal in size; the       respirophasic diameter changes were in the normal range (= 50%);       findings are consistent with normal central venous pressure.     Impressions:            - Normal LV size and systolic function, EF 60-65%. Murmur may be due       to aortic sclerosis. No significant valvular abnormality. (03/18/2009 15:54) Added new observation of NUCLEAR NOS: Exercise Capacity: Lexiscan BP Response: Normal blood pressure response. Clinical Symptoms: No chest pain ECG Impression: No significant ST segment change suggestive of ischemia. Overall Impression: Normal stress nuclear study. Overall Impression Comments: normal  (02/25/2009 15:54)      Echocardiogram  Procedure date:  03/18/2009  Findings:       Left ventricle: The cavity size was normal. Wall thickness was       normal. Systolic function was normal. The estimated ejection       fraction was in the range of 60% to 65%. Wall motion was normal;       there were no regional wall motion abnormalities. Doppler       parameters are consistent with abnormal left ventricular       relaxation (grade 1 diastolic  dysfunction).     - Aortic valve: Sclerosis without stenosis. Mean gradient: 9mm Hg       (S).     - Mitral valve: Trivial regurgitation.     - Left atrium: The atrium was mildly dilated.     - Right ventricle: The cavity size was normal. Systolic function was       normal.     - Pulmonary arteries: PA peak pressure: 34mm Hg (S).     - Inferior vena cava: The vessel was normal in size; the       respirophasic diameter changes were in the normal range (= 50%);       findings are consistent with normal central venous pressure.     Impressions:            - Normal LV size and systolic function, EF 60-65%. Murmur may be due       to aortic sclerosis. No significant valvular abnormality.  Nuclear Study  Procedure date:  02/25/2009  Findings:      Exercise Capacity: Lexiscan BP Response: Normal blood pressure response. Clinical Symptoms: No chest pain ECG Impression: No significant ST  segment change suggestive of ischemia. Overall Impression: Normal stress nuclear study. Overall Impression Comments: normal

## 2010-03-09 NOTE — Assessment & Plan Note (Signed)
Summary: per pt 3 mth fu---stc   Vital Signs:  Patient profile:   70 year old female Height:      62 inches (157.48 cm) Weight:      177.25 pounds (80.57 kg) BMI:     32.54 O2 Sat:      97 % on Room air Temp:     98.7 degrees F (37.06 degrees C) oral Pulse rate:   79 / minute BP sitting:   124 / 72  (left arm) Cuff size:   large  Vitals Entered By: Brenton Grills CMA Duncan Dull) (December 16, 2009 1:15 PM)  O2 Flow:  Room air CC: Follow-up visit/pt is no longer taking Alprazolam or Ultram and is not taking Losartan Postassim-HCTZ/aj Is Patient Diabetic? Yes   Primary Provider:  Erick Alley, MD  CC:  Follow-up visit/pt is no longer taking Alprazolam or Ultram and is not taking Losartan Postassim-HCTZ/aj.  History of Present Illness: pt takes and tolerates diovan-hct.  she does not want to take losartan-hct.  i have reviewed the prior auth form with her.  ins will not pay for the diovan-hct.    Current Medications (verified): 1)  Foltx 2.5-25-2 Mg  Tabs (Fa-Pyridoxine-Cyancobalamin) .... Take 1 By Mouth Qd 2)  Multivitamins   Tabs (Multiple Vitamin) .... Take 1 By Mouth Qd 3)  Reglan 5 Mg Tabs (Metoclopramide Hcl) .... Qac and Qhs 4)  Nexium 40 Mg  Cpdr (Esomeprazole Magnesium) .... Qd 5)  Ascensia Contour Test  Strp (Glucose Blood) .... Qid, and Lancets 250.00 6)  Lipitor 80 Mg Tabs (Atorvastatin Calcium) .... 1/2 Qd 7)  Metformin Hcl 500 Mg Xr24h-Tab (Metformin Hcl) .... 2-Bid 8)  Asprin 81mg  .... One Daily 9)  Vitamin E .... Daily 10)  Vision Formula .... Daily 11)  Celebrex 200 Mg Caps (Celecoxib) .Marland Kitchen.. 1 Daily 12)  Alprazolam 0.5 Mg Tabs (Alprazolam) .Marland Kitchen.. 1 Three Times A Day As Needed Anxiety 13)  Ultram 50 Mg Tabs (Tramadol Hcl) .Marland Kitchen.. 1 Q4h As Needed Headache 14)  Losartan Potassium-Hctz 100-25 Mg Tabs (Losartan Potassium-Hctz) .Marland Kitchen.. 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    Physical Exam  General:  normal appearance.   Extremities:  no edema   Impression & Recommendations:  Problem # 1:  HYPERTENSION (ICD-401.9) well-controlled, but ins will not pay for this med.  still, at pt's request, i sent pa form  Medications Added to Medication List This Visit: 1)  Diovan Hct 320-25 Mg Tabs (Valsartan-hydrochlorothiazide) .Marland Kitchen.. 1 tab once daily  Other Orders: TLB-A1C / Hgb A1C (Glycohemoglobin) (83036-A1C) Est. Patient Level III (16109)  Patient Instructions: 1)  here are several months' of samples of diovan-hct.  it goes generic sometime next year.   2)  please schedula a wellness visit.     Orders Added: 1)  TLB-A1C / Hgb A1C (Glycohemoglobin) [83036-A1C] 2)  Est. Patient Level III [60454]

## 2010-03-09 NOTE — Letter (Signed)
Summary: CMN for Glucometer Supplies/[facility]  CMN for Glucometer Supplies/[facility]   Imported By: Sherian Rein 10/08/2009 07:55:50  _____________________________________________________________________  External Attachment:    Type:   Image     Comment:   External Document

## 2010-03-09 NOTE — Letter (Signed)
Summary: Request Cholesterol Results/[facility]  Request Cholesterol Results/[facility]   Imported By: Sherian Rein 03/17/2009 10:16:28  _____________________________________________________________________  External Attachment:    Type:   Image     Comment:   External Document

## 2010-03-09 NOTE — Assessment & Plan Note (Signed)
Summary: 6 mon rov svt, murmur  pfh,rn      Allergies Added: NKDA  Visit Type:  Follow-up Primary Kilynn Fitzsimmons:  Erick Alley, MD  CC:  SVT.  History of Present Illness: The patient presents for followup of known coronary disease. Since I last saw her she has had no new chest discomfort, neck or arm discomfort. She has had no palpitations, presyncope or syncope. Her echocardiogram demonstrated well-preserved ejection fraction with very mild aortic stenosis. She is no longer working because her husband requires total care. She is fatigued because of this. She does all the activities around the house including vacuuming without bringing on any acute symptoms.  Current Medications (verified): 1)  Foltx 2.5-25-2 Mg  Tabs (Fa-Pyridoxine-Cyancobalamin) .... Take 1 By Mouth Qd 2)  Multivitamins   Tabs (Multiple Vitamin) .... Take 1 By Mouth Qd 3)  Reglan 5 Mg Tabs (Metoclopramide Hcl) .... Qac and Qhs 4)  Nexium 40 Mg  Cpdr (Esomeprazole Magnesium) .... Qd 5)  Ascensia Contour Test  Strp (Glucose Blood) .... Qid, and Lancets 250.00 6)  Lipitor 80 Mg Tabs (Atorvastatin Calcium) .... 1/2 Qd 7)  Metformin Hcl 500 Mg Xr24h-Tab (Metformin Hcl) .... 2-Bid 8)  Asprin 81mg  .... One Daily 9)  Vitamin E .... Daily 10)  Vision Formula .... Daily 11)  Celebrex 200 Mg Caps (Celecoxib) .Marland Kitchen.. 1 Daily 12)  Alprazolam 0.5 Mg Tabs (Alprazolam) .Marland Kitchen.. 1 Three Times A Day As Needed Anxiety 13)  Ultram 50 Mg Tabs (Tramadol Hcl) .Marland Kitchen.. 1 Q4h As Needed Headache 14)  Diovan Hct 320-25 Mg Tabs (Valsartan-Hydrochlorothiazide) .Marland Kitchen.. 1 Qd 15)  Augmentin 500-125 Mg Tabs (Amoxicillin-Pot Clavulanate) .Marland Kitchen.. 1 Tab Three Times A Day  Allergies (verified): No Known Drug Allergies  Past History:  Past Medical History: Reviewed history from 05/18/2008 and no changes required. 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 Lower arterial (03/23/2005) DEXA (06/2005) EGD (08/30/2000) Colonoscopy 08/30/00 Cholecystectomy 1993  Review of Systems       As stated in the HPI and negative for all other systems.   Vital Signs:  Patient profile:   70 year old female Height:      62 inches Weight:      175 pounds BMI:     32.12 Pulse rate:   74 / minute Resp:     18 per minute BP sitting:   141 / 76  (right arm)  Vitals Entered By: Marrion Coy, CNA (September 17, 2009 11:50 AM)  Physical Exam  General:  Well developed, well nourished, in no acute distress. Head:  normocephalic and atraumatic Neck:  Neck supple, no JVD. No masses, thyromegaly or abnormal cervical nodes. Chest Wall:  no deformities or breast masses noted Lungs:  Clear bilaterally to auscultation and percussion. Abdomen:  Bowel sounds positive; abdomen soft and non-tender without masses, organomegaly, or hernias noted. No hepatosplenomegaly. Msk:  Back normal, normal gait. Muscle strength and tone normal. Extremities:  No clubbing or cyanosis. Neurologic:  Alert and oriented x 3. Skin:  Intact without lesions or rashes. Cervical Nodes:  no significant adenopathy Psych:  depressed affect.     Detailed Cardiovascular Exam  Neck    Carotids: Carotids full and equal bilaterally without  bruits.      Neck Veins: Normal, no JVD.    Heart    Inspection: no deformities or lifts noted.      Palpation: normal PMI with no thrills palpable.      Auscultation: S1 and S2 within normal limits, no S3, no S4, no clicks, no rubs, 2/6 apical systolic murmur radiating at the aortic outflow tract, no diastolic murmurs.  Vascular    Abdominal Aorta: no  palpable masses, pulsations, or audible bruits.      Femoral Pulses: normal femoral pulses bilaterally.      Pedal Pulses: normal pedal pulses bilaterally.      Radial Pulses: normal radial pulses bilaterally.      Peripheral Circulation: no clubbing, cyanosis, or edema noted with normal capillary refill.     EKG  Procedure date:  09/17/2009  Findings:      Sinus rhythm, rate 74, axis within normal limits, intervals within normal limits, no acute ST-T wave changes  Impression & Recommendations:  Problem # 1:  SYSTOLIC MURMUR (ZOX-096.0) She has some mild aortic sclerosis with minimal gradient. This can be followed clinically.  Problem # 2:  CHEST PAIN (ICD-786.50) She has had no new chest pain. No further cardiovascular testing is suggested.  Problem # 3:  SUPRAVENTRICULAR TACHYCARDIA (ICD-427.89) She has had no recurrence of this. No further therapy is planned. She will let us know if she has any further paroxysms.

## 2010-03-09 NOTE — Assessment & Plan Note (Signed)
Summary: rov    Visit Type:  Follow-up Primary Provider:  Erick Alley, MD  CC:  palpitations.  History of Present Illness: The patient presents for evaluation of tachycardia. She was in the hospital on December 5 in the emergency room after developing a racing heart at work. She felt cold and sweaty. She presented to the emergency room where she had a narrow complex tachycardia at 149. It's a reentrant tachycardia. This broke with adenosine. In retrospect she's had some infrequent tachypalpitations similar to this but very short lived. She never had a sustained episode. She's had none since then. She did have a stress test following this. 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 She says she otherwise is doing relatively well. She works at a [employer]. I don't think she exercises routinely. With her daily activities she does not report chest discomfort, neck or arm discomfort. She doesn't have any significant shortness of breath, PND or orthopnea. She describes some atypical dyspnea at night.  Problems Prior to Update: 1)  Systolic Murmur  (ICD-785.2) 2)  Supraventricular Tachycardia  (ICD-427.89) 3)  Screening For Endocrine, Nutritional, Metabolic Disorder  (ICD-V77.99) 4)  Edema  (ICD-782.3) 5)  Climacteric State, Female  (ICD-V49.81) 6)  Plantar Fasciitis  (ICD-728.71) 7)  Chest Pain  (ICD-786.50) 8)  Gastroparesis  (ICD-536.3) 9)  Anemia, Iron Deficiency  (ICD-280.9) 10)  Leg Pain, Right  (ICD-729.5) 11)  Unspecified Anemia  (ICD-285.9) 12)  Nausea  (ICD-787.02) 13)  Hypercholesterolemia  (ICD-272.0) 14)  Antihyperlipidemic Use, Long Term  (ICD-V58.69) 15)  Headache  (ICD-784.0) 16)  Routine General Medical Exam@[facility]  (ICD-V70.0) 17)  Hypertension  (ICD-401.9) 18)  Gerd  (ICD-530.81) 19)  Diabetes Mellitus, Type II  (ICD-250.00)  Current Medications (verified): 1)  Foltx 2.5-25-2 Mg  Tabs  (Fa-Pyridoxine-Cyancobalamin) .... Take 1 By Mouth Qd 2)  Multivitamins   Tabs (Multiple Vitamin) .... Take 1 By Mouth Qd 3)  Reglan 5 Mg Tabs (Metoclopramide Hcl) .... Qac and Qhs 4)  Nexium 40 Mg  Cpdr (Esomeprazole Magnesium) .... Qd 5)  Ascensia Contour Test  Strp (Glucose Blood) .... Qid, and Lancets 250.00 6)  Lipitor 80 Mg Tabs (Atorvastatin Calcium) .... 1/2 Qd 7)  Metformin Hcl 500 Mg Xr24h-Tab (Metformin Hcl) .... 2-Bid 8)  Asprin 81mg  .... One Daily 9)  Vitamin E .... Daily 10)  Vision Formula .... Daily 11)  Celebrex 200 Mg Caps (Celecoxib) .Marland Kitchen.. 1 Daily 12)  Alprazolam 0.5 Mg Tabs (Alprazolam) .Marland Kitchen.. 1 Three Times A Day As Needed Anxiety 13)  Ultram 50 Mg Tabs (Tramadol Hcl) .Marland Kitchen.. 1 Q4h As Needed Headache 14)  Diovan Hct 320-25 Mg Tabs (Valsartan-Hydrochlorothiazide) .Marland Kitchen.. 1 Qd  Allergies: 1)  ! * Asprin  Past History:  Past Medical History: Reviewed history from 05/18/2008 and no changes required. 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   Vital Signs:  Patient profile:   70 year old female Height:      62 inches Weight:  181 pounds BMI:     33.22 Pulse rate:   75 / minute BP sitting:   146 / 75  (left arm) Cuff size:   large  Vitals Entered By: Oswald Hillock (March 02, 2009 2:39 PM)  Physical Exam  General:  Well developed, well nourished, in no acute distress. Head:  normocephalic and atraumatic Eyes:  PERRLA/EOM  intact; conjunctiva and lids normal. Mouth:  Teeth, gums and palate normal. Oral mucosa normal. Neck:  Neck supple, no JVD. No masses, thyromegaly or abnormal cervical nodes. Chest Wall:  no deformities or breast masses noted Lungs:  Clear bilaterally to auscultation and percussion. Abdomen:  Bowel sounds positive; abdomen soft and non-tender without masses, organomegaly, or hernias noted. No hepatosplenomegaly. Msk:  Back normal, normal gait. Muscle strength and tone normal. Extremities:  No clubbing or cyanosis. Neurologic:  Alert and oriented x 3. Skin:  Intact without lesions or rashes. Cervical Nodes:  no significant adenopathy Axillary Nodes:  no significant adenopathy Inguinal Nodes:  no significant adenopathy Psych:  Normal affect.   Detailed Cardiovascular Exam  Neck    Carotids: Carotids full and equal bilaterally without bruits.      Neck Veins: Normal, no JVD.    Heart    Inspection: no deformities or lifts noted.      Palpation: normal PMI with no thrills palpable.      Auscultation: S1 and S2 within normal limits, no S3, no S4, no clicks, no rubs, 2/6 apical systolic murmur radiating at the aortic outflow tract, no diastolic murmurs.  Vascular    Abdominal Aorta: no palpable masses, pulsations, or audible bruits.      Femoral Pulses: normal femoral pulses bilaterally.      Pedal Pulses: normal pedal pulses bilaterally.      Radial Pulses: normal radial pulses bilaterally.      Peripheral Circulation: no clubbing, cyanosis, or edema noted with normal capillary refill.     EKG  Procedure date:  01/10/2009  Findings:      supraventricular tachycardia, rate 49, subtle RSR prime V1, no acute ST-T wave changes.  Impression & Recommendations:  Problem # 1:  SUPRAVENTRICULAR TACHYCARDIA (ICD-427.89) The the patient has a supraventricular tachycardia. This was her first presentation. At this point there is no indication for ablation. If this recurs I would most  likely change her medications to include an AV nodal blocking agent. She will let me know if she has recurrent up the patient. I did teach her some vagal maneuvers. Orders: Echocardiogram (Echo)  Problem # 2:  SYSTOLIC MURMUR (ZOX-096.2) The patient has a slight systolic murmur radiating at the aortic outflow tract. She does have some occasional dyspnea at night. I will check an echocardiogram. I suspect aortic sclerosis. Her updated medication list for this problem includes:    Diovan Hct 320-25 Mg Tabs (Valsartan-hydrochlorothiazide) .Marland Kitchen... 1 qd  Problem # 3:  HYPERTENSION (ICD-401.9) Her blood pressure is okay. She will continue the meds as listed. Orders: Echocardiogram (Echo)  Patient Instructions: 1)  Your physician recommends that you schedule a follow-up appointment in: 6 months with DR Kimberlyann Hollar 2)  Your physician recommends that you continue on your current medications as directed. Please refer to the Current Medication list given to you today. 3)  Your physician has requested that you have an echocardiogram.  Echocardiography is a painless test that uses sound waves to create images of your heart. It provides your doctor with information about the size and shape of your heart and how  well your heart's chambers and valves are working.  This procedure takes approximately one hour. There are no restrictions for this procedure.

## 2010-03-09 NOTE — Progress Notes (Signed)
Summary: CBG testing strips  Phone Note Call from Patient Call back at Home Phone [phone]   Caller: Patient Summary of Call: pt left vm requesting more test strips than once daily. How often should she be testing CBG's? and what time of day?  If she should be testing more than once daily can we send in new rx for more strips to [facility]? Initial call taken by: Lanier Prude, [facility]),  November 26, 2009 4:34 PM  Follow-up for Phone Call        once daily is plenty for her situation Follow-up by: Minus Breeding MD,  November 26, 2009 5:02 PM  Additional Follow-up for Phone Call Additional follow up Details #1::        pt's husband informed Additional Follow-up by: Lanier Prude, [facility]),  November 26, 2009 5:04 PM

## 2010-03-09 NOTE — Miscellaneous (Signed)
  Medications Added LOSARTAN POTASSIUM-HCTZ 100-25 MG TABS (LOSARTAN POTASSIUM-HCTZ) 1 tab once daily       Clinical Lists Changes  Medications: Removed medication of DIOVAN HCT 320-25 MG TABS (VALSARTAN-HYDROCHLOROTHIAZIDE) 1 qd Added new medication of LOSARTAN POTASSIUM-HCTZ 100-25 MG TABS (LOSARTAN POTASSIUM-HCTZ) 1 tab once daily - Signed Rx of LOSARTAN POTASSIUM-HCTZ 100-25 MG TABS (LOSARTAN POTASSIUM-HCTZ) 1 tab once daily;  #30 x 11;  Signed;  Entered by: Minus Breeding MD;  Authorized by: Minus Breeding MD;  Method used: Electronically to [facility] [phone] [place], [place], [place]  [address], Ph: [phone], Fax: [phone]    Prescriptions: LOSARTAN POTASSIUM-HCTZ 100-25 MG TABS (LOSARTAN POTASSIUM-HCTZ) 1 tab once daily  #30 x 11   Entered and Authorized by:   Minus Breeding MD   Signed by:   Minus Breeding MD on 12/09/2009   Method used:   Electronically to        [facility] [address] (retail)       [phone]       [place], [place]  [address]       Ph: [phone]       Fax: [phone]   RxID:   [id number]

## 2010-03-09 NOTE — Medication Information (Signed)
Summary: Approved/[facility]  Approved/[facility]   Imported By: Lester [place] 12/23/2009 07:25:25  _____________________________________________________________________  External Attachment:    Type:   Image     Comment:   External Document

## 2010-03-09 NOTE — Progress Notes (Signed)
Summary: Test Strips?  Phone Note Call from Patient Call back at [facility] Phone [phone]   Caller: Patient Summary of Call: Pt called stating that she has previously been checking her CBGs qid per Rx. Pt is requesting to have this changed on DM supplier form. Pt also wanted MD to be informed that she does not have Medicare so she can test more than once daily. Please advise. Initial call taken by: Margaret Pyle, CMA,  November 29, 2009 10:08 AM  Follow-up for Phone Call        given that your blood sugar is well-controlled on just metformin, i can't justify to the insurance company any more than once daily Follow-up by: Minus Breeding MD,  November 29, 2009 12:49 PM  Additional Follow-up for Phone Call Additional follow up Details #1::        left message on machine for pt informing her of above and adviing her to call back if she had any further questions or concerns. Additional Follow-up by: Margaret Pyle, CMA,  November 29, 2009 3:59 PM

## 2010-03-09 NOTE — Letter (Signed)
Summary: [facility]   Imported By: Sherian Rein 05/24/2009 11:24:44  _____________________________________________________________________  External Attachment:    Type:   Image     Comment:   External Document

## 2010-03-09 NOTE — Assessment & Plan Note (Signed)
Summary: r/s from 5/19 per cardiology   Vital Signs:  Patient profile:   70 year old female Height:      62 inches (157.48 cm) Weight:      177.50 pounds (80.68 kg) O2 Sat:      98 % on Room air Temp:     98.4 degrees F (36.89 degrees C) oral Pulse rate:   77 / minute BP sitting:   130 / 82  (left arm) Cuff size:   large  Vitals Entered By: Josph Macho RMA (Jun 28, 2009 9:19 AM)  O2 Flow:  Room air CC: Follow-up visit/ CF Is Patient Diabetic? Yes   Primary Provider:  Erick Alley, MD  CC:  Follow-up visit/ CF.  History of Present Illness: fe-deficiency anemia:  she intermittently takes fe tabs.  no brbpr. dm:  no cbg record, but states cbg's are well-controlled. pt states few weeks of a slight "sensation" at the back of the throat, and associated rhinorrhea.  Current Medications (verified): 1)  Foltx 2.5-25-2 Mg  Tabs (Fa-Pyridoxine-Cyancobalamin) .... Take 1 By Mouth Qd 2)  Multivitamins   Tabs (Multiple Vitamin) .... Take 1 By Mouth Qd 3)  Reglan 5 Mg Tabs (Metoclopramide Hcl) .... Qac and Qhs 4)  Nexium 40 Mg  Cpdr (Esomeprazole Magnesium) .... Qd 5)  Ascensia Contour Test  Strp (Glucose Blood) .... Qid, and Lancets 250.00 6)  Lipitor 80 Mg Tabs (Atorvastatin Calcium) .... 1/2 Qd 7)  Metformin Hcl 500 Mg Xr24h-Tab (Metformin Hcl) .... 2-Bid 8)  Asprin 81mg  .... One Daily 9)  Vitamin E .... Daily 10)  Vision Formula .... Daily 11)  Celebrex 200 Mg Caps (Celecoxib) .Marland Kitchen.. 1 Daily 12)  Alprazolam 0.5 Mg Tabs (Alprazolam) .Marland Kitchen.. 1 Three Times A Day As Needed Anxiety 13)  Ultram 50 Mg Tabs (Tramadol Hcl) .Marland Kitchen.. 1 Q4h As Needed Headache 14)  Diovan Hct 320-25 Mg Tabs (Valsartan-Hydrochlorothiazide) .Marland Kitchen.. 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         no earache  Physical Exam  General:  normal appearance.   Head:  head: no deformity eyes: no periorbital swelling, no proptosis external nose and ears are normal mouth: no lesion seen Ears:  TM's intact and clear with normal canals with grossly normal hearing.   Additional Exam:  LDL Cholesterol           95 mg/dL   Hemoglobin           [L]  11.8 g/dL                   16.1-09.6 Hematocrit           [L]  34.9 %    Hemoglobin A1C       [H]  6.6 %     Impression & Recommendations:  Problem # 1:  ANEMIA, IRON DEFICIENCY (ICD-280.9) needs increased rx  Problem # 2:  DIABETES MELLITUS, TYPE II (ICD-250.00) well-controlled  Problem # 3:  neck sxs uncertain etiology  Problem # 4:  HYPERCHOLESTEROLEMIA (ICD-272.0) needs increased rx  Medications Added to Medication List This Visit: 1)  Azithromycin 500 Mg Tabs (Azithromycin) .Marland KitchenMarland KitchenMarland Kitchen 1  once daily  Other Orders: TLB-Lipid Panel (80061-LIPID) TLB-BMP (Basic Metabolic Panel-BMET) (80048-METABOL) TLB-CBC Platelet - w/Differential (85025-CBCD) TLB-Hepatic/Liver Function Pnl (80076-HEPATIC) TLB-TSH (Thyroid Stimulating Hormone) (84443-TSH) TLB-A1C / Hgb A1C (Glycohemoglobin) (83036-A1C) TLB-Microalbumin/Creat Ratio, Urine (82043-MALB) TLB-Udip w/ Micro (81001-URINE) TLB-IBC Pnl (Iron/FE;Transferrin) (83550-IBC) Est. Patient Level IV (03474)  Patient Instructions: 1)  tests are being ordered for you today.  a few days after the test(s), please call [phone] to hear your test results. 2)  pending the test results, please continue the same medications for now. 3)  return soon for a physical. 4)  trial of allegra 180 mg once daily. and azithromycin 500 mg once daily. 5)  (update: i left  message on phone-tree:  options are changing lipitor to crestor, or adding colestid.  also, you should take fe 1 two times a day). Prescriptions: METFORMIN HCL 500 MG XR24H-TAB (METFORMIN HCL) 2-bid  #360 x 1   Entered and Authorized by:   Minus Breeding MD   Signed by:   Minus Breeding MD on 06/28/2009   Method used:   Electronically to        [facility]* (mail-order)       [address] [place], [place]  [address]       Ph: [phone]       Fax: [phone]   RxID:   [id number] AZITHROMYCIN 500 MG TABS (AZITHROMYCIN) 1 once daily  #6 x 0   Entered and Authorized by:   Minus Breeding MD   Signed by:   Minus Breeding MD on 06/28/2009   Method used:   Electronically to        [facility] [address] (retail)       [phone]       [place], [place]  [address]       Ph: [phone]       Fax: [phone]   RxID:   [id number]

## 2010-03-09 NOTE — Assessment & Plan Note (Signed)
Summary: STOMACH PAIN X 2 DYS---STC   Vital Signs:  Patient profile:   70 year old female Height:      62 inches (157.48 cm) Weight:      176 pounds (80 kg) BMI:     32.31 O2 Sat:      95 % on Room air Temp:     98.4 degrees F (36.89 degrees C) oral Pulse rate:   71 / minute BP sitting:   122 / 70  (left arm) Cuff size:   large  Vitals Entered By: Brenton Grills MA (September 16, 2009 10:58 AM)  O2 Flow:  Room air CC: Stomach pain x 4 days/pt is no longer taking Azithromycin/aj Is Patient Diabetic? Yes   Primary Litsy Epting:  Erick Alley, MD  CC:  Stomach pain x 4 days/pt is no longer taking Azithromycin/aj.  History of Present Illness: pt states 3 days of moderate pain at the suprapubic area.  no assoc n/v.  the pain is improved over the past day or so.  she says abx helped in the past--dx was diverticulitis. she takes fe 1/day, and lipitor 40 mg once daily.  Current Medications (verified): 1)  Foltx 2.5-25-2 Mg  Tabs (Fa-Pyridoxine-Cyancobalamin) .... Take 1 By Mouth Qd 2)  Multivitamins   Tabs (Multiple Vitamin) .... Take 1 By Mouth Qd 3)  Reglan 5 Mg Tabs (Metoclopramide Hcl) .... Qac and Qhs 4)  Nexium 40 Mg  Cpdr (Esomeprazole Magnesium) .... Qd 5)  Ascensia Contour Test  Strp (Glucose Blood) .... Qid, and Lancets 250.00 6)  Lipitor 80 Mg Tabs (Atorvastatin Calcium) .... 1/2 Qd 7)  Metformin Hcl 500 Mg Xr24h-Tab (Metformin Hcl) .... 2-Bid 8)  Asprin 81mg  .... One Daily 9)  Vitamin E .... Daily 10)  Vision Formula .... Daily 11)  Celebrex 200 Mg Caps (Celecoxib) .Marland Kitchen.. 1 Daily 12)  Alprazolam 0.5 Mg Tabs (Alprazolam) .Marland Kitchen.. 1 Three Times A Day As Needed Anxiety 13)  Ultram 50 Mg Tabs (Tramadol Hcl) .Marland Kitchen.. 1 Q4h As Needed Headache 14)  Diovan Hct 320-25 Mg Tabs (Valsartan-Hydrochlorothiazide) .Marland Kitchen.. 1 Qd 15)  Azithromycin 500 Mg Tabs (Azithromycin) .Marland Kitchen.. 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    Physical Exam  General:  normal appearance.   Abdomen:  abdomen is soft, nontender.  no hepatosplenomegaly.   not distended.  no hernia  Additional Exam:  Hemoglobin                12.3 g/dL                   11.9-14.7   Hematocrit                36.8 %       Impression & Recommendations:  Problem # 1:  ABDOMINAL PAIN OTHER SPECIFIED SITE (ICD-789.09) uncertain etiology ? diverticulitis  Problem # 2:  HYPERCHOLESTEROLEMIA (ICD-272.0) pt declines changes in cholesterol medication regimen, so not rechecked today  Problem # 3:  ANEMIA, IRON DEFICIENCY (ICD-280.9) Assessment: Improved  Medications Added to Medication List This Visit: 1)  Augmentin 500-125 Mg Tabs (Amoxicillin-pot  clavulanate) .Marland Kitchen.. 1 tab three times a day  Other Orders: Radiology Referral (Radiology) TLB-CBC Platelet - w/Differential (85025-CBCD) TLB-Hepatic/Liver Function Pnl (80076-HEPATIC) TLB-IBC Pnl (Iron/FE;Transferrin) (83550-IBC) TLB-A1C / Hgb A1C (Glycohemoglobin) (83036-A1C) TLB-Amylase (82150-AMYL) TLB-BMP (Basic Metabolic Panel-BMET) (80048-METABOL) Est. Patient Level IV (04540)  Patient Instructions: 1)  blood tests and ultrasound are being ordered for you today.  a few days after the test(s), please call [phone] to hear your test results. 2)  trial of augmentin 500 mg three times a day 3)  call next week if not better, to consider ct 4)  (update: i left message on phone-tree:  rx as we discussed) Prescriptions: AUGMENTIN 500-125 MG TABS  (AMOXICILLIN-POT CLAVULANATE) 1 tab three times a day  #21 x 0   Entered and Authorized by:   Minus Breeding MD   Signed by:   Minus Breeding MD on 09/16/2009   Method used:   Electronically to        [facility] [address] (retail)       [phone]       [place], [place]  [address]       Ph: [phone]       Fax: [phone]   RxID:   [id number]     Laboratory Results   Urine Tests   Date/Time Reported: 09/16/2009 11:33am  Routine Urinalysis   Color: yellow Appearance: Clear Glucose: negative   (Normal Range: Negative) Bilirubin: negative   (Normal Range: Negative) Ketone: negative   (Normal Range: Negative) Spec. Gravity: 1.005   (Normal Range: 1.003-1.035) Blood: trace-intact   (Normal Range: Negative) pH: 6.0   (Normal Range: 5.0-8.0) Protein: negative   (Normal Range: Negative) Urobilinogen: 0.2   (Normal Range: 0-1) Nitrite: negative   (Normal Range: Negative) Leukocyte Esterace: negative   (Normal Range: Negative)

## 2010-03-09 NOTE — Progress Notes (Signed)
Summary: rx refill req  Phone Note Refill Request Message from:  Fax from Pharmacy on December 10, 2009 8:24 AM  Refills Requested: Medication #1:  METFORMIN HCL 500 MG XR24H-TAB 2-bid   Dosage confirmed as above?Dosage Confirmed  Method Requested: Electronic Next Appointment Scheduled: 12/16/2009 Initial call taken by: Brenton Grills CMA Duncan Dull),  December 10, 2009 8:24 AM    Prescriptions: METFORMIN HCL 500 MG XR24H-TAB (METFORMIN HCL) 2-bid  #360 x 1   Entered by:   Brenton Grills CMA (AAMA)   Authorized by:   Minus Breeding MD   Signed by:   Brenton Grills CMA (AAMA) on 12/10/2009   Method used:   Electronically to        [facility]* (mail-order)       [address] [place], [place]  [address]       Ph: [phone]       Fax: [phone]   RxID:   [id number]

## 2010-03-09 NOTE — Assessment & Plan Note (Signed)
Summary: flu shot/cd   Nurse Visit   Allergies: No Known Drug Allergies  Orders Added: 1)  Admin 1st Vaccine [90471] 2)  Flu Vaccine 33yrs + [[address]]       Flu Vaccine Consent Questions     Do you have a history of severe allergic reactions to this vaccine? no    Any prior history of allergic reactions to egg and/or gelatin? no    Do you have a sensitivity to the preservative Thimersol? no    Do you have a past history of Guillan-Barre Syndrome? no    Do you currently have an acute febrile illness? no    Have you ever had a severe reaction to latex? no    Vaccine information given and explained to patient? yes    Are you currently pregnant? no    Lot Number:[id number]   Exp Date:08/06/2010   Site Given  Left Deltoid IM

## 2010-03-09 NOTE — Assessment & Plan Note (Signed)
Summary: YEARLY FU/NWS   Vital Signs:  Patient profile:   70 year old female Height:      62 inches (157.48 cm) Weight:      175.25 pounds (79.66 kg) BMI:     32.17 O2 Sat:      98 % on Room air Temp:     98.0 degrees F (36.67 degrees C) oral Pulse rate:   73 / minute Pulse rhythm:   regular BP sitting:   102 / 60  (left arm) Cuff size:   large  Vitals Entered By: Brenton Grills CMA Duncan Dull) (January 10, 2010 2:47 PM)  O2 Flow:  Room air CC: Yearly F/U/aj Is Patient Diabetic? Yes   Primary Provider:  Erick Alley, MD  CC:  Yearly F/U/aj.  History of Present Illness: here for regular wellness examination.  she's feeling pretty well in general, and does not drink or smoke.  Current Medications (verified): 1)  Foltx 2.5-25-2 Mg  Tabs (Fa-Pyridoxine-Cyancobalamin) .... Take 1 By Mouth Qd 2)  Multivitamins   Tabs (Multiple Vitamin) .... Take 1 By Mouth Qd 3)  Reglan 5 Mg Tabs (Metoclopramide Hcl) .... Qac and Qhs 4)  Nexium 40 Mg  Cpdr (Esomeprazole Magnesium) .... Qd 5)  Ascensia Contour Test  Strp (Glucose Blood) .... Qid, and Lancets 250.00 6)  Lipitor 80 Mg Tabs (Atorvastatin Calcium) .... 1/2 Qd 7)  Metformin Hcl 500 Mg Xr24h-Tab (Metformin Hcl) .... 2-Bid 8)  Asprin 81mg  .... One Daily 9)  Vitamin E .... Daily 10)  Vision Formula .... Daily 11)  Celebrex 200 Mg Caps (Celecoxib) .Marland Kitchen.. 1 Daily 12)  Diovan Hct 320-25 Mg Tabs (Valsartan-Hydrochlorothiazide) .Marland Kitchen.. 1 Tab Once Daily  Allergies (verified): No Known Drug Allergies  Family History: Reviewed history from 05/18/2008 and no changes required. No cancer Family History of Diabetes: Mother Family History of Heart Disease: Brother (died MI age 76) & Sister (died MI age 20) Stroke : Father (died age 72)  Social History: Reviewed history from 05/15/2008 and no changes required. 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  Alcohol Use - no Daily Caffeine Use-1 Illicit Drug Use - no  Review of Systems  The  patient denies fever, weight loss, weight gain, vision loss, decreased hearing, chest pain, dyspnea on exertion, prolonged cough, abdominal pain, melena, hematochezia, severe indigestion/heartburn, hematuria, suspicious skin lesions, and depression.    Physical Exam  General:  normal appearance.   Neck:  Supple without thyroid enlargement or tenderness.  Chest Wall:  sees gyn  Lungs:  Clear to auscultation bilaterally. Normal respiratory effort.  Heart:  Regular rate and rhythm without murmurs or gallops noted. Normal S1,S2.   Abdomen:  abdomen is soft, nontender.  no hepatosplenomegaly.   not distended.  no hernia  Rectal:  sees gyn  Genitalia:  sees gyn  Msk:  muscle bulk and strength are grossly normal.  no obvious joint swelling.  gait is normal and steady  Pulses:  dorsalis pedis intact bilat.  no carotid bruit  Extremities:  no deformity.  no ulcer on the feet.  feet are of normal color and temp.  no edema  Neurologic:  sensation is intact to touch on the feet Skin:  normal texture and temp.  no rash.  not diaphoretic  Cervical Nodes:  No significant adenopathy.  Psych:  Alert and cooperative; normal mood and affect; normal attention span and concentration.   Additional Exam:  SEPARATE EVALUATION FOLLOWS--EACH PROBLEM HERE IS NEW, NOT RESPONDING TO TREATMENT, OR POSES  SIGNIFICANT RISK TO THE PATIENT'S HEALTH: HISTORY OF THE PRESENT ILLNESS: pt states 1 year of pain at the left tmj area, left submandibular area, and the entire left side of the head.  and assoc numbness.  it is worse with sleeping on the area.  chart says i eval pt for these sxs in mid-2009. PAST MEDICAL HISTORY reviewed and up to date today REVIEW OF SYSTEMS: denies syncope PHYSICAL EXAMINATION: head: no deformity eyes: no periorbital swelling, no proptosis external nose and ears are normal mouth: no lesion seen ears: eac's and tm's are normal cn 2-12 grossly intact.   readily moves all 4's.     IMPRESSION: facial pain, uncertain etiology.  persistent PLAN: see instruction sheet    Impression & Recommendations:  Problem # 1:  ROUTINE GENERAL MEDICAL EXAM@HEALTH  CARE FACL (ICD-V70.0)  Medications Added to Medication List This Visit: 1)  Metformin Hcl 500 Mg Xr24h-tab (Metformin hcl) .... 2-bid  Other Orders: TLB-IBC Pnl (Iron/FE;Transferrin) (83550-IBC) TLB-CBC Platelet - w/Differential (85025-CBCD) TLB-Lipid Panel (80061-LIPID) TLB-Hepatic/Liver Function Pnl (80076-HEPATIC) Neurology Referral (Neuro) Est. Patient Level III (27253) Est. Patient 65& > (66440)  Preventive Care Screening  Mammogram:    Date:  04/06/2009    Results:  normal      gyn is dr Edward Jolly   Patient Instructions: 1)  blood tests are being ordered for you today.  please call [phone] to hear your test results. 2)  please consider the addition of januvia, and call if you wish to add it.   3)  refer to a headache specialist.  you will be called with a day and time for an appointment.   4)  please consider these measures for your health:  minimize alcohol.  do not use tobacco products.  have a colonoscopy at least every 10 years from age 63.  keep firearms safely stored.  always use seat belts.  have working smoke alarms in your home.  see an eye doctor and dentist regularly.  never drive under the influence of alcohol or drugs (including prescription drugs).  those with fair skin should take precautions against the sun. 5)  please let me know what your wishes would be, if artificial life support measures should become necessary.  it is critically important to prevent falling down (keep floor areas well-lit, dry, and free of loose objects). 6)  please call when you want to schedule a bone-density test. 7)  (update: we discussed code status.  pt requests full code, but would not want to be started or maintained on artificial life-support measures if there was not a reasonable chance of  recovery)   Orders Added: 1)  TLB-IBC Pnl (Iron/FE;Transferrin) [83550-IBC] 2)  TLB-CBC Platelet - w/Differential [85025-CBCD] 3)  TLB-Lipid Panel [80061-LIPID] 4)  TLB-Hepatic/Liver Function Pnl [80076-HEPATIC] 5)  Neurology Referral [Neuro] 6)  Est. Patient Level III [56387] 7)  Est. Patient 65& > [56433]    Preventive Care Screening  Mammogram:    Date:  04/06/2009    Results:  normal     Preventive Care Screening  Mammogram:    Date:  04/06/2009    Results:  normal      gyn is dr Edward Jolly

## 2010-03-09 NOTE — Assessment & Plan Note (Signed)
Summary: 4 MO ROV /NWS #   PT WANTED TO COME SAME DAY AS HUSBAND/NWS   Vital Signs:  Patient profile:   70 year old female Height:      62 inches (157.48 cm) Weight:      182.13 pounds (82.79 kg) O2 Sat:      97 % on Room air Temp:     97.0 degrees F (36.11 degrees C) oral Pulse rate:   78 / minute BP sitting:   128 / 70  (left arm) Cuff size:   large  Vitals Entered By: Josph Macho CMA (March 17, 2009 10:38 AM)  O2 Flow:  Room air CC: 4 month follow up/ CF Is Patient Diabetic? Yes   Primary Provider:  Erick Alley, MD  CC:  4 month follow up/ CF.  History of Present Illness: no cbg record, but states cbg's are well-controlled.   pt states few mos of moderate diffuse arthralgias, worst at the elbows.  no associated numbnss.  Current Medications (verified): 1)  Foltx 2.5-25-2 Mg  Tabs (Fa-Pyridoxine-Cyancobalamin) .... Take 1 By Mouth Qd 2)  Multivitamins   Tabs (Multiple Vitamin) .... Take 1 By Mouth Qd 3)  Reglan 5 Mg Tabs (Metoclopramide Hcl) .... Qac and Qhs 4)  Nexium 40 Mg  Cpdr (Esomeprazole Magnesium) .... Qd 5)  Ascensia Contour Test  Strp (Glucose Blood) .... Qid, and Lancets 250.00 6)  Lipitor 80 Mg Tabs (Atorvastatin Calcium) .... 1/2 Qd 7)  Metformin Hcl 500 Mg Xr24h-Tab (Metformin Hcl) .... 2-Bid 8)  Asprin 81mg  .... One Daily 9)  Vitamin E .... Daily 10)  Vision Formula .... Daily 11)  Celebrex 200 Mg Caps (Celecoxib) .Marland Kitchen.. 1 Daily 12)  Alprazolam 0.5 Mg Tabs (Alprazolam) .Marland Kitchen.. 1 Three Times A Day As Needed Anxiety 13)  Ultram 50 Mg Tabs (Tramadol Hcl) .Marland Kitchen.. 1 Q4h As Needed Headache 14)  Diovan Hct 320-25 Mg Tabs (Valsartan-Hydrochlorothiazide) .Marland Kitchen.. 1 Qd  Allergies (verified): 1)  ! * Asprin  Review of Systems       The patient complains of weight gain.    Physical Exam  General:  normal appearance.   Msk:  elbows are normal Extremities:  no edema Additional Exam:  Hemoglobin A1C       [H]  6.8 %                       4.6-6.5 Sed Rate                   20 mm/hr                    0-22 Rheumatiod Factor         <20.0 IU/ml                 0.0-20.0 Uric Acid                 4.4 mg/dL     Impression & Recommendations:  Problem # 1:  DIABETES MELLITUS, TYPE II (ICD-250.00) well-controlled  Problem # 2:  ARTHRALGIA (ICD-719.40) uncertain etiology  Other Orders: TLB-A1C / Hgb A1C (Glycohemoglobin) (83036-A1C) TLB-Sedimentation Rate (ESR) (85652-ESR) TLB-Rheumatoid Factor (RA) ([address]-UE) TLB-Uric Acid, Blood (84550-URIC) Est. Patient Level III (45409)  Patient Instructions: 1)  tests are being ordered for you today.  a few days after the test(s), please call [phone] to hear your test results. 2)  pending the test results, please continue the same medications for now. 3)  return 3-4 months for a physical. 4)  (update: i left message on phone-tree:  rx as we discussed) Prescriptions: REGLAN 5 MG TABS (METOCLOPRAMIDE HCL) qac and qhs  #360 x 3   Entered and Authorized by:   Minus Breeding MD   Signed by:   Minus Breeding MD on 03/17/2009   Method used:   Electronically to        [facility]* (mail-order)       [address] [place], [place]  [address]       Ph: [phone]       Fax: [phone]   RxID:   [id number]

## 2010-03-09 NOTE — Letter (Signed)
Summary: Lipid Letter  [facility]  [address] [place], [place] [address]   Phone: [phone]  Fax: [phone]    03/15/2009  [facility] [address] [place], [place]  [address]  Dear [name]:  We have carefully reviewed your last lipid profile from 06/08/2008 and the results are noted below with a summary of recommendations for lipid management.    Cholesterol:       175       HDL "good" Cholesterol:   96.29       LDL "bad" Cholesterol:   84       Triglycerides:       134.0        please continue the same amount of lipitor    TLC Diet (Therapeutic Lifestyle Change): Saturated Fats & Transfatty acids should be kept < 7% of total calories ***Reduce Saturated Fats Polyunstaurated Fat can be up to 10% of total calories Monounsaturated Fat Fat can be up to 20% of total calories Total Fat should be no greater than 25-35% of total calories Carbohydrates should be 50-60% of total calories Protein should be approximately 15% of total calories Fiber should be at least 20-30 grams a day ***Increased fiber may help lower LDL Total Cholesterol should be < 200mg /day Consider adding plant stanol/sterols to diet (example: Benacol spread) ***A higher intake of unsaturated fat may reduce Triglycerides and Increase HDL    Adjunctive Measures (may lower LIPIDS and reduce risk of Heart Attack) include: Aerobic Exercise (20-30 minutes 3-4 times a week) Limit Alcohol Consumption Weight Reduction Aspirin 75-81 mg a day by mouth (if not allergic or contraindicated) Dietary Fiber 20-30 grams a day by mouth     Current Medications: 1)    Foltx 2.5-25-2 Mg  Tabs (Fa-pyridoxine-cyancobalamin) .... Take 1 by mouth qd 2)    Multivitamins   Tabs (Multiple vitamin) .... Take 1 by mouth qd 3)    Reglan 5 Mg Tabs (Metoclopramide hcl) .... Qac and qhs 4)    Nexium 40 Mg  Cpdr (Esomeprazole magnesium) .... Qd 5)    Ascensia Contour Test  Strp (Glucose blood) ....  Qid, and lancets 250.00 6)    Lipitor 80 Mg Tabs (Atorvastatin calcium) .... 1/2 qd 7)    Metformin Hcl 500 Mg Xr24h-tab (Metformin hcl) .... 2-bid 8)    Asprin 81mg   .... One daily 9)    Vitamin E  .... Daily 10)    Vision Formula  .... Daily 11)    Celebrex 200 Mg Caps (Celecoxib) .Marland Kitchen.. 1 daily 12)    Alprazolam 0.5 Mg Tabs (Alprazolam) .Marland Kitchen.. 1 three times a day as needed anxiety 13)    Ultram 50 Mg Tabs (Tramadol hcl) .Marland Kitchen.. 1 q4h as needed headache 14)    Diovan Hct 320-25 Mg Tabs (Valsartan-hydrochlorothiazide) .Marland Kitchen.. 1 qd  If you have any questions, please call. We appreciate being able to work with you.   Sincerely,    [facility] Minus Breeding MD  Appended Document: Lipid Letter Mailed to [name] at fax# [phone].

## 2010-03-09 NOTE — Progress Notes (Signed)
Summary: rx refill req  Phone Note Refill Request Message from:  Fax from Pharmacy on November 25, 2009 3:20 PM  Refills Requested: Medication #1:  LIPITOR 80 MG TABS 1/2 qd   Dosage confirmed as above?Dosage Confirmed  Method Requested: Fax to Mail Away Pharmacy Initial call taken by: Brenton Grills MA,  November 25, 2009 3:21 PM    Prescriptions: LIPITOR 80 MG TABS (ATORVASTATIN CALCIUM) 1/2 qd  #45 x 3   Entered by:   Brenton Grills MA   Authorized by:   Minus Breeding MD   Signed by:   Brenton Grills MA on 11/25/2009   Method used:   Faxed to ...       [facility] ([facility]       [address] [place], [place]  [address]       Ph: [phone]       Fax: [phone]   RxID:   [id number]

## 2010-03-09 NOTE — Progress Notes (Signed)
Summary: Nuclear Pre-Procedure  Phone Note Outgoing Call   Summary of Call: Reviewed information on Myoview Information Sheet (see scanned document for further details).  Spoke with patient, and written instructions were given to patient per P.Edwards RN     Nuclear Med Background Indications for Stress Test: Evaluation for Ischemia   History: Myocardial Perfusion Study  History Comments: 10/07 MPS EF 82% low risk mild inferolateral thinning (-) ischemia HX: SVT  Symptoms: Diaphoresis, Nausea, Palpitations    Nuclear Pre-Procedure Cardiac Risk Factors: Family History - CAD, Hypertension, Lipids, NIDDM Height (in): 62  Nuclear Med Study Referring MD:  S.Ellison

## 2010-03-09 NOTE — Assessment & Plan Note (Signed)
Summary: adenosine/dx:palps/wt:177.5/ins:uhc/dr ellison  Nuclear Med Background Indications for Stress Test: Evaluation for Ischemia   History: Myocardial Perfusion Study  History Comments: 10/07 ZOX:WRUE infero-lateral thinning, no ischemia, EF=82%; h/o SVT  Symptoms: Chest Tightness, Diaphoresis, DOE, Fatigue, Nausea, Palpitations, Rapid HR  Symptoms Comments: Last episode of CP:10 days ago.   Nuclear Pre-Procedure Cardiac Risk Factors: Family History - CAD, Hypertension, Lipids, NIDDM, Obesity Caffeine/Decaff Intake: None NPO After: 10:30 PM Lungs: Clear IV 0.9% NS with Angio Cath: 22g     IV Site: (R) Hand IV Started by: Stanton Kidney EMT-P Chest Size (in) 40     Cup Size D     Height (in): 62 Weight (lb): 179 BMI: 32.86 Tech Comments: 7:00 am CBG=124  Nuclear Med Study 1 or 2 day study:  1 day     Stress Test Type:  Eugenie Birks Reading MD:  Charlton Haws, MD     Referring MD:  Romero Belling, MD Resting Radionuclide:  Technetium 58m Tetrofosmin     Resting Radionuclide Dose:  10.5 mCi  Stress Radionuclide:  Technetium 53m Tetrofosmin     Stress Radionuclide Dose:  33.0 mCi   Stress Protocol   Lexiscan: 0.4 mg   Stress Test Technologist:  Rea [employer] CMA-N     Nuclear Technologist:  Harlow Asa CNMT  Rest Procedure  Myocardial perfusion imaging was performed at rest 45 minutes following the intravenous administration of Myoview Technetium 9m Tetrofosmin.  Stress Procedure  The patient received IV Lexiscan 0.4 mg over 15-seconds.  Myoview injected at 30-seconds.  There were no significant changes with Lexiscan.  She did c/o chest heaviness.  Quantitative spect images were obtained after a 45 minute delay.  QPS Raw Data Images:  Normal; no motion artifact; normal heart/lung ratio. Stress Images:  NI: Uniform and normal uptake of tracer in all myocardial segments. Rest Images:  Normal homogeneous uptake in all areas of the myocardium. Subtraction (SDS):   Normal Transient Ischemic Dilatation:  .90  (Normal <1.22)  Lung/Heart Ratio:  .27  (Normal <0.45)  Quantitative Gated Spect Images QGS EDV:  49 ml QGS ESV:  9 ml QGS EF:  82 % QGS cine images:  normal  Findings Normal nuclear study      Overall Impression  Exercise Capacity: Lexiscan BP Response: Normal blood pressure response. Clinical Symptoms: No chest pain ECG Impression: No significant ST segment change suggestive of ischemia. Overall Impression: Normal stress nuclear study. Overall Impression Comments: normal  Appended Document: adenosine/dx:palps/wt:177.5/ins:uhc/dr ellison please leave message on phone tree--normal  Appended Document: adenosine/dx:palps/wt:177.5/ins:uhc/dr ellison Message left on pt

## 2010-03-09 NOTE — Progress Notes (Signed)
Summary: PA-Diovan  ---- Converted from flag ---- ---- 12/08/2009 1:12 PM, Minus Breeding MD wrote: hyzaar ------------------------------  please call patient.  insurance won't pay for diovan-hct, so i have changed to generic og hyzaar. Phone Note Call from Patient Call back at Home Phone [phone]   Caller: Patient Summary of Call: Pt called stating that she contacted her Insurance Co and was advised that PA needs to be done and her meds will be approved. Pt is requesting PA [phone] Initial call taken by: Margaret Pyle, CMA,  December 09, 2009 10:57 AM  Follow-up for Phone Call        i am happy to do the form, but there is a charge for the form, and it won't be approved unless she has failed generic hyzaar.  even if it was, the copay wouldl be higher.   Follow-up by: Minus Breeding MD,  December 09, 2009 12:08 PM  Additional Follow-up for Phone Call Additional follow up Details #1::        PA-Diovan HCT 320-25mg , Called [facility] @ [phone], awaiting form. Dagoberto Reef  December 14, 2009 3:45 PM  PA faxed to [facility] @ [phone], awaiting approval. Dagoberto Reef  December 17, 2009 3:42 PM  PA Diovan approved 12/18/2009-12/19/2011, pt aware.  Additional Follow-up by: Dagoberto Reef,  December 20, 2009 8:39 AM      pt informed. pt is upset because she is unsure that new medication is going to work for her and doesn't want to switch to an new BP medication. pt would like to discuss when she comes in for her appointment next week.  Brenton Grills CMA Duncan Dull)  December 09, 2009 8:51 AM   we'll discuss then, but please note that pharmacy will not refill diovan-hct any more, so please make sure you have enough to last until your aqppointment.

## 2010-03-10 NOTE — Letter (Signed)
Summary: [name] Consult Scheduled Letter  [facility]  [address] [place], [place] [address]   Phone: [phone]  Fax: [phone]      02/14/2010 MRN: [id number]  [name] [address] [place], [place]  [address]    Dear [name],      We have scheduled an appointment for you.  At the recommendation of Dr.Ellison, we have scheduled you a consult with Dr Marjory Lies on 04/08/10 at 11:30am.  Their phone number is [phone].  If this appointment day and time is not convenient for you, please feel free to call the office of the doctor you are being referred to at the number listed above and reschedule the appointment.     [facility] [address] [place], [place] [address]   Please arrive 30 minutes prior to appointment.    Thank you,  Patient Care Coordinator [facility]

## 2010-03-11 ENCOUNTER — Telehealth: Payer: Self-pay | Admitting: Endocrinology

## 2010-03-14 ENCOUNTER — Encounter (INDEPENDENT_AMBULATORY_CARE_PROVIDER_SITE_OTHER): Payer: Self-pay | Admitting: *Deleted

## 2010-03-15 ENCOUNTER — Telehealth: Payer: Self-pay | Admitting: Internal Medicine

## 2010-03-15 ENCOUNTER — Telehealth (INDEPENDENT_AMBULATORY_CARE_PROVIDER_SITE_OTHER): Payer: Self-pay | Admitting: *Deleted

## 2010-03-16 NOTE — Progress Notes (Signed)
Summary: referral  Phone Note Call from Patient Call back at Home Phone [phone]   Caller: Patient Summary of Call: Pt called requesting referral to GI specialist Initial call taken by: Margaret Pyle, CMA,  March 11, 2010 2:57 PM  Follow-up for Phone Call        done Follow-up by: Minus Breeding MD,  March 11, 2010 3:43 PM  Additional Follow-up for Phone Call Additional follow up Details #1::        Pt informed Additional Follow-up by: Margaret Pyle, CMA,  March 11, 2010 4:45 PM

## 2010-03-16 NOTE — Letter (Signed)
Summary: CMN for Diabetic Supplies / [facility]  CMN for Diabetic Supplies / [facility]   Imported By: Lennie Odor 03/08/2010 14:25:41  _____________________________________________________________________  External Attachment:    Type:   Image     Comment:   External Document

## 2010-03-16 NOTE — Progress Notes (Signed)
  Phone Note Call from Patient Call back at [facility] [name] Phone [phone]   Caller: Patient Summary of Call: Pt called requesting Rx for nausea. Pt also wants to know if MD wanted her start Iron supplement? Initial call taken by: Margaret Pyle, CMA,  March 09, 2010 9:38 AM  Follow-up for Phone Call        i sent rx for ondansetron to [facility], but it is cheapest at [facility]. i strongly advise upper endoscopy. take iron (non-prescrtion), 1 tab two times a day Follow-up by: Minus Breeding MD,  March 09, 2010 9:45 AM  Additional Follow-up for Phone Call Additional follow up Details #1::        Pt advised of above and will call back to have EGD referral done once she can make time and arrage help for her husband Additional Follow-up by: Margaret Pyle, CMA,  March 09, 2010 10:12 AM    New/Updated Medications: ONDANSETRON HCL 4 MG TABS (ONDANSETRON HCL) 1 every 4 hrs as needed for nausea Prescriptions: ONDANSETRON HCL 4 MG TABS (ONDANSETRON HCL) 1 every 4 hrs as needed for nausea  #30 x 1   Entered and Authorized by:   Minus Breeding MD   Signed by:   Minus Breeding MD on 03/09/2010   Method used:   Electronically to        [facility] [address] (retail)       [phone]       [place], [place]  [address]       Ph: [phone]       Fax: [phone]   RxID:   [id number]

## 2010-03-18 ENCOUNTER — Encounter: Payer: Self-pay | Admitting: Internal Medicine

## 2010-03-18 ENCOUNTER — Ambulatory Visit (INDEPENDENT_AMBULATORY_CARE_PROVIDER_SITE_OTHER): Payer: Medicare Other | Admitting: Internal Medicine

## 2010-03-18 DIAGNOSIS — R1013 Epigastric pain: Secondary | ICD-10-CM

## 2010-03-18 DIAGNOSIS — D131 Benign neoplasm of stomach: Secondary | ICD-10-CM | POA: Insufficient documentation

## 2010-03-18 DIAGNOSIS — R634 Abnormal weight loss: Secondary | ICD-10-CM | POA: Insufficient documentation

## 2010-03-18 DIAGNOSIS — R109 Unspecified abdominal pain: Secondary | ICD-10-CM | POA: Insufficient documentation

## 2010-03-18 DIAGNOSIS — R110 Nausea: Secondary | ICD-10-CM

## 2010-03-18 HISTORY — DX: Unspecified abdominal pain: R10.9

## 2010-03-24 NOTE — Assessment & Plan Note (Signed)
Summary: abd pain/cd   Vital Signs:  Patient profile:   70 year old female Height:      62 inches (157.48 cm) Weight:      173 pounds (78.64 kg) BMI:     31.76 O2 Sat:      99 % on Room air Temp:     98.0 degrees F (36.67 degrees C) oral Pulse rate:   80 / minute Pulse rhythm:   regular BP sitting:   104 / 70  (left arm) Cuff size:   large  Vitals Entered By: Brenton Grills CMA (AAMA) (March 08, 2010 2:04 PM)  O2 Flow:  Room air CC: Abdominal Pain, nausea x 1 week/aj Is Patient Diabetic? Yes   Primary Provider:  Erick Alley, MD  CC:  Abdominal Pain and nausea x 1 week/aj.  History of Present Illness: pt states 10 days of mild epigastric pain, and assoc heartburn.  she is feeling better today.   she says she was rx'ed for h pylori approx 10 years ago.  reglan helps.   Current Medications (verified): 1)  Foltx 2.5-25-2 Mg  Tabs (Fa-Pyridoxine-Cyancobalamin) .... Take 1 By Mouth Qd 2)  Multivitamins   Tabs (Multiple Vitamin) .... Take 1 By Mouth Qd 3)  Reglan 5 Mg Tabs (Metoclopramide Hcl) .... Qac and Qhs 4)  Nexium 40 Mg  Cpdr (Esomeprazole Magnesium) .... Qd 5)  Ascensia Contour Test  Strp (Glucose Blood) .... Qid, and Lancets 250.00 6)  Lipitor 80 Mg Tabs (Atorvastatin Calcium) .... 1/2 Qd 7)  Metformin Hcl 500 Mg Xr24h-Tab (Metformin Hcl) .... 2-Bid 8)  Asprin 81mg  .... One Daily 9)  Vitamin E .... Daily 10)  Vision Formula .... Daily 11)  Celebrex 200 Mg Caps (Celecoxib) .Marland Kitchen.. 1 Daily 12)  Diovan Hct 320-25 Mg Tabs (Valsartan-Hydrochlorothiazide) .Marland Kitchen.. 1 Tab Once Daily  Allergies (verified): No Known Drug Allergies  Past History:  Past Medical History: Last updated: 05/18/2008 Hematuria,w/u NEG Spinal OA Diverticulosis GASTROPARESIS (ICD-536.3) ANEMIA, IRON DEFICIENCY (ICD-280.9) LEG PAIN, RIGHT (ICD-729.5) NAUSEA (ICD-787.02) HYPERCHOLESTEROLEMIA (ICD-272.0) ANTIHYPERLIPIDEMIC USE, LONG TERM (ICD-V58.69) x 17 years HEADACHE (ICD-784.0) HYPERTENSION  x  20 years GERD (ICD-530.81) DIABETES MELLITUS, TYPE II (ICD-250.00) x 10 years    Review of Systems  The patient denies chest pain, hematochezia, hematuria, and fever.         denies n/v/d.  Physical Exam  General:  normal appearance.   Abdomen:  abdomen is soft, nontender.  no hepatosplenomegaly.   not distended.  no hernia  Additional Exam:  Hemoglobin           [L]  11.6 g/dL                   16.1-09.6   Hematocrit           [L]  34.3 %                      36.0-46.0   Iron Saturation      [L]  7.0 %                       20.0-50.0   Hemoglobin A1C       [H]  6.7 %                       4.6-6.5   H Pylori                  Positive  Negative   Amylase                   83 U/L       Impression & Recommendations:  Problem # 1:  ABDOMINAL PAIN OTHER SPECIFIED SITE (ICD-789.09) uncertain etiology  Problem # 2:  ANEMIA, IRON DEFICIENCY (ICD-280.9) needs increased rx  Problem # 3:  DIABETES MELLITUS, TYPE II (ICD-250.00) well-controlled  Other Orders: TLB-Hepatic/Liver Function Pnl (80076-HEPATIC) TLB-Lipid Panel (80061-LIPID) TLB-CBC Platelet - w/Differential (85025-CBCD) TLB-IBC Pnl (Iron/FE;Transferrin) (83550-IBC) TLB-A1C / Hgb A1C (Glycohemoglobin) (83036-A1C) TLB-H. Pylori Abs(Helicobacter Pylori) (86677-HELICO) TLB-Amylase (82150-AMYL) Est. Patient Level IV (01027)  Patient Instructions: 1)  blood tests are being ordered for you today.  please call [phone] to hear your test results. 2)  call in a few days if symptoms persist, to consider seeing a specialist. 3)  you should try to minimize reglan. 4)  (update: i left message on phone-tree:  you should have egd.  take fe 1/day)   Orders Added: 1)  TLB-Hepatic/Liver Function Pnl [80076-HEPATIC] 2)  TLB-Lipid Panel [80061-LIPID] 3)  TLB-CBC Platelet - w/Differential [85025-CBCD] 4)  TLB-IBC Pnl (Iron/FE;Transferrin) [83550-IBC] 5)  TLB-A1C / Hgb A1C (Glycohemoglobin) [83036-A1C] 6)  TLB-H.  Pylori Abs(Helicobacter Pylori) [86677-HELICO] 7)  TLB-Amylase [82150-AMYL] 8)  Est. Patient Level IV [03474]

## 2010-03-24 NOTE — Progress Notes (Signed)
Summary: referral  Phone Note Call from Patient Call back at Home Phone [phone]   Caller: Patient Summary of Call: Pt called requesting SAE request an earlier GI appt on her behalf, per pt abdominal pain and discomfort has been increasing. Initial call taken by: Margaret Pyle, CMA,  March 15, 2010 4:26 PM  Follow-up for Phone Call        to pcc Follow-up by: Minus Breeding MD,  March 15, 2010 4:37 PM  Additional Follow-up for Phone Call Additional follow up Details #1::        See phone note dated 03/15/10 from GI dept. Additional Follow-up by: Dagoberto Reef,  March 16, 2010 1:22 PM

## 2010-03-24 NOTE — Letter (Signed)
Summary: [facility] Consult Scheduled Letter  [facility]  [address] [place], [place] [address]   Phone: [phone]  Fax: [phone]      03/14/2010 MRN: [id number]  LATERRIA LASOTA [address] [place], [place]  [address]    Dear Ms. Hoel,      We have scheduled an appointment for you.  At the recommendation of Dr.Ellison, we have scheduled you a consult with Dr Leone Payor on 04/18/10 at 9:45am.  Their phone number is [phone].  If this appointment day and time is not convenient for you, please feel free to call the office of the doctor you are being referred to at the number listed above and reschedule the appointment.    [facility] [address] [place], [place] [address] *Gastroenterology Dept.3rd Floor*    Please give 24hr notice if you need to cancel/reschedule to avoid a $50.00 fee.Also bring insurance card and any co-pay due at time of visit.    Thank you,  Patient Care Coordinator [facility]

## 2010-03-24 NOTE — Letter (Signed)
Summary: EGD Instructions  [facility]  [address] [place], [place] [address]   Phone: [phone]  Fax: [phone]       [name]    04/06/1940    MRN: [id number]       Procedure Day Dorna Bloom: Wednesday 04/20/10     Arrival Time:  12:30 pm     Procedure Time: 1:30 pm     Location of Procedure:                    _x _ [facility] (4th Floor)  PREPARATION FOR ENDOSCOPY   On 04/20/10 THE DAY OF THE PROCEDURE:  1.   No solid foods, milk or milk products are allowed after midnight the night before your procedure.  2.   Do not drink anything colored red or purple.  Avoid juices with pulp.  No orange juice.  3.  You may drink clear liquids until 11:30 am, which is 2 hours before your procedure.                                                                                                CLEAR LIQUIDS INCLUDE: Water Jello Ice Popsicles Tea (sugar ok, no milk/cream) Powdered fruit flavored drinks Coffee (sugar ok, no milk/cream) Gatorade Juice: apple, white grape, white cranberry  Lemonade Clear bullion, consomm, broth Carbonated beverages (any kind) Strained chicken noodle soup Hard Candy   MEDICATION INSTRUCTIONS  Unless otherwise instructed, you should take regular prescription medications with a small sip of water as early as possible the morning of your procedure.  Diabetic patients - see separate instructions.                 OTHER INSTRUCTIONS  You will need a responsible adult at least 69 years of age to accompany you and drive you home.   This person must remain in the waiting room during your procedure.  Wear loose fitting clothing that is easily removed.  Leave jewelry and other valuables at home.  However, you may wish to bring a book to read or an iPod/MP3 player to listen to music as you wait for your procedure to start.  Remove all body piercing jewelry and leave at home.  Total time from sign-in until  discharge is approximately 2-3 hours.  You should go home directly after your procedure and rest.  You can resume normal activities the day after your procedure.  The day of your procedure you should not:   Drive   Make legal decisions   Operate machinery   Drink alcohol   Return to work  You will receive specific instructions about eating, activities and medications before you leave.    The above instructions have been reviewed and explained to me by  Lamona Curl CMA Duncan Dull)  March 18, 2010 9:29 AM     I fully understand and can verbalize these instructions _____________________________ Date 03/18/10

## 2010-03-24 NOTE — Progress Notes (Signed)
Summary: switch triage   Phone Note Call from Patient Call back at Home Phone [phone]   Caller: Patient Call For: Dr Leone Payor Reason for Call: Talk to Nurse Summary of Call: Patient wants to switch from Dr Leone Payor to any other doctor because she did not like her visit the last time she was in and she states that he was "cool about it". but she wants to be seen asap for severe abd pain. Initial call taken by: Tawni Levy,  March 15, 2010 4:23 PM  Follow-up for Phone Call        Left message for patient to call back Darcey Nora RN, [facility]  March 16, 2010 10:40 AM  Patient does not really want to switch she just wants an earlier appointment with any doctor before 04/18/10.  Patient is offered an appointment for 02/15/10 this Friday at 8:30, patient doesn't want to come for an earlier appointment.  She is insistent that I give her an appointment with any doctor earlier than 04/18/10, but not ealry in the am.  i have advised the patient that she can come to the appointment this Friday, or I will be happy to place her on the canceleation for Dr Leone Payor and maybe someone will cancel between now and 04/18/10.  She has decided to take the appointment for Friday am. Follow-up by: Darcey Nora RN, CGRN,  March 16, 2010 12:04 PM

## 2010-03-24 NOTE — Assessment & Plan Note (Signed)
Summary: Abdominal Pain/Sheri    History of Present Illness Visit Type: Follow-up Visit Primary GI MD: Stan Head MD [facility] Primary Provider: Erick Alley, MD Requesting Provider: Romero Belling, MD Chief Complaint: Pt c/o abdominal pain, states she is much better now History of Present Illness:   70 yo woman from [place] ([place]) describing heaviness in epigastric and nausea. Scratchy sensation in her chest and throat area. After a week of symptoms, she went to Dr. Everardo All. (notes reviewed) She stopped eating oranges, tomatoes and drinking coffee and is much better. She had been drinking "strong coffee" 3+ times a day. This began after eating chicken nuggets from a restaurant. She saw Dr. Everardo All 1/31and had + H. pylori test serology. She was treated for that in the past (10 years ago). She stopped metaclopramide but had heaviness in stomach and nausea. She has been eating less since she started with these symptoms. She quit working when husband became very ill - at [employer]. He had had colonic obstruction and cardiac complications after surgery. She is now helping to care for him.   GI Review of Systems    Reports abdominal pain, acid reflux, belching, bloating, and  nausea.     Location of  Abdominal pain: epigastric area.    Denies chest pain, dysphagia with liquids, dysphagia with solids, heartburn, loss of appetite, vomiting, vomiting blood, weight loss, and  weight gain.        Denies anal fissure, black tarry stools, change in bowel habit, constipation, diarrhea, diverticulosis, fecal incontinence, heme positive stool, hemorrhoids, irritable bowel syndrome, jaundice, light color stool, liver problems, rectal bleeding, and  rectal pain.    Current Medications (verified): 1)  Foltx 2.5-25-2 Mg  Tabs (Fa-Pyridoxine-Cyancobalamin) .... Take 1 By Mouth Qd 2)  Multivitamins   Tabs (Multiple Vitamin) .... Take 1 By Mouth Qd 3)  Reglan 5 Mg Tabs (Metoclopramide Hcl) .... Qac  and Qhs 4)  Nexium 40 Mg  Cpdr (Esomeprazole Magnesium) .... Qd 5)  Ascensia Contour Test  Strp (Glucose Blood) .... Qid, and Lancets 250.00 6)  Lipitor 80 Mg Tabs (Atorvastatin Calcium) .... 1/2 Qd 7)  Metformin Hcl 500 Mg Xr24h-Tab (Metformin Hcl) .... 2-Bid 8)  Asprin 81mg  .... One Daily 9)  Vitamin E .... Daily 10)  Vision Formula .... Daily 11)  Celebrex 200 Mg Caps (Celecoxib) .Marland Kitchen.. 1 Daily 12)  Diovan Hct 320-25 Mg Tabs (Valsartan-Hydrochlorothiazide) .Marland Kitchen.. 1 Tab Once Daily 13)  Ondansetron Hcl 4 Mg Tabs (Ondansetron Hcl) .Marland Kitchen.. 1 Every 4 Hrs As Needed For Nausea  Allergies (verified): No Known Drug Allergies  Past History:  Family History: Last updated: 05/18/2008 No cancer Family History of Diabetes: Mother Family History of Heart Disease: Brother (died MI age 44) & Sister (died MI age 20) Stroke : Father (died age 63)  Social History: Last updated: 05/15/2008 Married  Works Engineering geologist Patient has never smoked.  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  All other ROS negative except as per HPI.   Vital Signs:  Patient profile:   70 year old female Height:      62 inches Weight:      174 pounds BMI:     31.94 BSA:     1.80 Pulse rate:   80 / minute Pulse rhythm:   regular BP sitting:   118 / 64  (left arm)  Vitals  Entered By: Merri Ray CMA Duncan Dull) (March 18, 2010 8:48 AM)  Physical Exam  General:  Well developed, well nourished, no acute distress. Eyes:  anicteric Mouth:  No deformity or lesions, dentition normal. Neck:  Supple; no masses or thyromegaly. Lungs:  Clear throughout to auscultation. Heart:  Regular rate and rhythm; no murmurs, rubs,  or bruits. Abdomen:  Soft, nontender and nondistended. No masses, hepatosplenomegaly. Normal bowel sounds. No splash. Extremities:  No clubbing, cyanosis, edema or deformities noted. Neurologic:  Alert and  oriented x4; no signs of tardive dyskinesia Cervical Nodes:  No significant cervical or supraclavicular adenopathy.  Psych:  Alert and cooperative. Normal mood and affect.   Impression & Recommendations:  Problem # 1:  EPIGASTRIC PAIN (ICD-789.06) Assessment Deteriorated  Seems related to her diet and she is better though had been ok for # of years. Suspect combination of gastroparesis flare, GERD. She has had some weight los that may be intentional but seems to have started before she changed eating habits. She is also under stress since Christmastime and husbands poor health, quitting work. Will go ahead with an EGD and reassess the polyps, look for gastritis. If active gastritis would treat for H. pylori though + serology may not mean an active infection. Metaclopramide does seem to help her quite a bit and no side effects seen. we idscussed possibility of domperidone pending EGD.  Risks, benefits,and indications of endoscopic procedure(s) were reviewed with the patient and all questions answered.  Orders: EGD (EGD)  Problem # 2:  WEIGHT LOSS (ICD-783.21) Assessment: New Probably multifactorial also. It is mild but parrt of overall situation - await EGD  Problem # 3:  NAUSEA (ICD-787.02) Assessment: Deteriorated likely multifactorial - GERD/Gastroparesis Orders: EGD (EGD)  Problem # 4:  GASTRIC POLYPS (ICD-211.1) Assessment:  Unchanged 2002 diagnosis of benign fundic gland polyps - will reassess at EGD  Patient Instructions: 1)  You have been scheduled for an endoscopy. Please follow written prep instructions that were given to you today at your visit.  2)  If you need to reschedule your appointment, as of now, we have 03/30/10 @ 10 or 11:30 am opening or Thursday 04/28/10 morning appointments available at this time. 3)  The medication list was reviewed and reconciled.  All changed / newly prescribed medications were explained.  A complete medication list was provided to the patient / caregiver.  Appended Document: Abdominal Pain/Sheri   Impression & Recommendations:  Problem # 1:  ANEMIA, IRON DEFICIENCY (ICD-280.9) Assessment Comment Only We did discuss having a colonoscopy./ She declined at this time, preferring to schedule EGD only.

## 2010-03-24 NOTE — Letter (Signed)
Summary: Diabetic Instructions  [facility]  [address] [place], [place] [address]   Phone: [phone]  Fax: [phone]    KLARISSA MCILVAIN 1940-12-09 MRN: [id number]   _ x _   ORAL DIABETIC MEDICATION INSTRUCTIONS           Metformin  The day before your procedure:   Take your diabetic pill as you do normally  The day of your procedure:   Do not take your diabetic pill    We will check your blood sugar levels during the admission process and again in Recovery before discharging you home  ________________________________________________________________________

## 2010-03-28 ENCOUNTER — Ambulatory Visit
Admission: RE | Admit: 2010-03-28 | Discharge: 2010-03-28 | Disposition: A | Discharge status: Home / Self Care | Payer: Medicare Other | Source: Ambulatory Visit | Attending: Obstetrics and Gynecology | Admitting: Obstetrics and Gynecology

## 2010-03-28 DIAGNOSIS — Z1239 Encounter for other screening for malignant neoplasm of breast: Secondary | ICD-10-CM

## 2010-04-18 ENCOUNTER — Telehealth: Payer: Self-pay | Admitting: Internal Medicine

## 2010-04-18 ENCOUNTER — Ambulatory Visit: Payer: Self-pay | Admitting: Internal Medicine

## 2010-04-18 ENCOUNTER — Telehealth: Payer: Self-pay | Admitting: Cardiology

## 2010-04-20 ENCOUNTER — Other Ambulatory Visit: Payer: Medicare Other | Admitting: Internal Medicine

## 2010-04-26 ENCOUNTER — Telehealth: Payer: Self-pay | Admitting: Endocrinology

## 2010-04-26 NOTE — Progress Notes (Signed)
Summary: Canceled Endo   Phone Note Call from Patient   Caller: Patient Call For: Dr. Leone Payor Summary of Call: Pt. canceled her Endo for 04-20-10 b/c her husband is real sick and does not have a driver and had an episode with her heart yesterday and going to make appt. w/cardiologist. Would you like pt. charged the cancelation fee? Initial call taken by: Karna Christmas,  April 18, 2010 10:03 AM  Follow-up for Phone Call        no Follow-up by: Iva Boop MD, Clementeen Graham,  April 18, 2010 12:45 PM

## 2010-05-05 NOTE — Progress Notes (Signed)
Summary: rapid heart beat    needs appt    lm to cb   Phone Note Call from Patient Call back at Home Phone [phone]   Reason for Call: Talk to Nurse Summary of Call: pt states she having rapid heart beat this weekend. pt would like to talk to a nurse. Initial call taken by: Roe Coombs,  April 18, 2010 10:39 AM  Follow-up for Phone Call        left message for pt to call back 11:29 am 04/18/2010 Avie Arenas, RN  YESTERDAY HAD FAST HEART BEAT - LASTED FOR A WHILE (FOR ABOUT 5) BUT STOPPED ON IT'S OWN.  STARTED WHILE SHE WAS SITTING STILL  BP WAS OK.  NO DIZZINESS BUT GOT VERY TIRED.  Has not happened again.  This is the first re-occurance she has had since 01/2009. Follow-up by: Charolotte Capuchin, RN,  April 18, 2010 11:51 AM  Additional Follow-up for Phone Call Additional follow up Details #1::        Schedule follow up to discuss.  Additional Follow-up by: Rollene Rotunda, MD, [facility],  April 18, 2010 2:58 PM    Additional Follow-up for Phone Call Additional follow up Details #2::    called pt to follow up on how she is feeling.  She is not at home, per husband, at this time and call back later.  Avie Arenas, RN 3:41 pm  Additional Follow-up for Phone Call Additional follow up Details #3:: Details for Additional Follow-up Action Taken: We will schedule a follow up. Additional Follow-up by: Rollene Rotunda, MD, [facility],  April 26, 2010 5:45 PM   Appended Document: rapid heart beat    needs appt    lm to cb pt was offered an appt with Dr Antoine Poche to discuss palps however she stated she couldn't come.  She will call back to reschedule

## 2010-05-05 NOTE — Progress Notes (Signed)
Summary: Pharmacy change  Phone Note Call from Patient Call back at Home Phone [phone]   Caller: Patient Summary of Call: Pt called requesting Rx be sent to [facility] Initial call taken by: Margaret Pyle, CMA,  April 26, 2010 8:38 AM    Prescriptions: DIOVAN HCT 320-25 MG TABS (VALSARTAN-HYDROCHLOROTHIAZIDE) 1 tab once daily  #90 x 1   Entered by:   Margaret Pyle, CMA   Authorized by:   Minus Breeding MD   Signed by:   Margaret Pyle, CMA on 04/26/2010   Method used:   Electronically to        [facility]* (mail-order)       [address]       [place], [place]  [address]       Ph: [phone]       Fax: [phone]   RxID:   [id number] METFORMIN HCL 500 MG XR24H-TAB (METFORMIN HCL) 2-bid  #360 x 1   Entered by:   Margaret Pyle, CMA   Authorized by:   Minus Breeding MD   Signed by:   Margaret Pyle, CMA on 04/26/2010   Method used:   Electronically to        [facility]* (mail-order)       [address] [place]  [address]       Ph: [phone]       Fax: [phone]   RxID:   [id number] LIPITOR 80 MG TABS (ATORVASTATIN CALCIUM) 1/2 qd  #45 x 1   Entered by:   Margaret Pyle, CMA   Authorized by:   Minus Breeding MD   Signed by:   Margaret Pyle, CMA on 04/26/2010   Method used:   Electronically to        [facility]* (mail-order)       [address] [place]  [address]       Ph: [phone]       Fax: [phone]   RxID:   [id number] NEXIUM 40 MG  CPDR (ESOMEPRAZOLE MAGNESIUM) qd  #90 x 1   Entered by:   Margaret Pyle, CMA   Authorized by:   Minus Breeding MD   Signed by:   Margaret Pyle, CMA on 04/26/2010   Method used:   Electronically to        [facility]* (mail-order)       [address]       [place],   [address]       Ph: [phone]     Fax: [phone]   RxID:   [id number] REGLAN 5 MG TABS (METOCLOPRAMIDE HCL) qac and qhs  #180 x 1   Entered by:   Margaret Pyle, CMA   Authorized by:   Minus Breeding MD   Signed by:   Margaret Pyle, CMA on 04/26/2010   Method used:   Electronically to        [facility]* (mail-order)       [address] [place] [address]  [address]       Ph: [phone]       Fax: [phone]   RxID:   [id number]

## 2010-05-10 LAB — BASIC METABOLIC PANEL
BUN: 18 mg/dL (ref 6–23)
CO2: 23 mEq/L (ref 19–32)
Calcium: 9 mg/dL (ref 8.4–10.5)
Creatinine, Ser: 0.81 mg/dL (ref 0.4–1.2)
GFR calc non Af Amer: >60 mL/min (ref >60)
Glucose, Bld: 190 mg/dL — ABNORMAL HIGH (ref 70–99)
Sodium: 136 mEq/L (ref 135–145)

## 2010-05-10 LAB — DIFFERENTIAL
Basophils Absolute: 0.1 10*3/uL (ref 0.0–0.1)
Basophils Relative: 1 % (ref 0–1)
Lymphocytes Relative: 14 % (ref 12–46)
Neutro Abs: 11.9 10*3/uL — ABNORMAL HIGH (ref 1.7–7.7)
Neutrophils Relative %: 80 % — ABNORMAL HIGH (ref 43–77)

## 2010-05-10 LAB — POCT CARDIAC MARKERS
CKMB, poc: 14.5 ng/mL (ref 1.0–8.0)
CKMB, poc: 2.9 ng/mL (ref 1.0–8.0)
CKMB, poc: 3.7 ng/mL (ref 1.0–8.0)
Myoglobin, poc: 77.7 ng/mL (ref 12–200)
Troponin i, poc: <0.05 ng/mL (ref 0.00–0.09)
Troponin i, poc: <0.05 ng/mL (ref 0.00–0.09)

## 2010-05-10 LAB — CBC
MCHC: 33.3 g/dL (ref 30.0–36.0)
Platelets: 226 10*3/uL (ref 150–400)
RDW: 14.3 % (ref 11.5–15.5)

## 2010-05-30 ENCOUNTER — Ambulatory Visit (INDEPENDENT_AMBULATORY_CARE_PROVIDER_SITE_OTHER): Payer: Medicare Other | Admitting: Internal Medicine

## 2010-05-30 ENCOUNTER — Encounter: Payer: Self-pay | Admitting: Internal Medicine

## 2010-05-30 VITALS — BP 120/62 | HR 75 | Temp 98.6°F | Ht 62.0 in | Wt 176.4 lb

## 2010-05-30 DIAGNOSIS — R1032 Left lower quadrant pain: Secondary | ICD-10-CM

## 2010-05-30 MED ORDER — CIPROFLOXACIN HCL 500 MG PO TABS: 500.0000 mg | ORAL_TABLET | Freq: Two times a day (BID) | ORAL | Status: AC

## 2010-05-30 MED ORDER — METRONIDAZOLE 500 MG PO TABS: 500.0000 mg | ORAL_TABLET | Freq: Three times a day (TID) | ORAL | Status: AC

## 2010-05-30 MED ORDER — TRAMADOL HCL 50 MG PO TABS: 50.0000 mg | ORAL_TABLET | Freq: Four times a day (QID) | ORAL | Status: AC | PRN

## 2010-05-30 NOTE — Patient Instructions (Signed)
It was good to see you today. Cipro AND Flagyl antibiotics for your abdominal pain, suspected diverticulitis - also tramadol for pain when/if tylenol ineffective -  Your prescription(s) have been submitted to your pharmacy. Please take as directed and contact our office if you believe you are having problem(s) with the medication(s). Please schedule followup i\\with Dr. Everardo All in next 3-4 weeks, call sooner if problems.

## 2010-05-30 NOTE — Progress Notes (Signed)
  Subjective:     [name] is a 70 y.o. female who presents for evaluation of abdominal pain. Onset was 10 days ago. Symptoms have been gradually worsening. The pain is described as aching, cramping and pressure-like, and is 6/10 in intensity. Pain is located in the LLQ and suprapubic region without radiation.  Aggravating factors: recumbency.  Alleviating factors: acetaminophen. Associated symptoms: myalgias and nausea. The patient denies constipation, diarrhea, dysuria, fever, hematochezia, hematuria and melena.  The patient's history has been marked as reviewed and updated as appropriate.  Review of Systems Respiratory: negative Cardiovascular: negative Genitourinary:negative except for HPI listed above.     Objective:    BP 120/62  Pulse 75  Temp(Src) 98.6 F (37 C) (Oral)  Ht 5\' 2"  (1.575 m)  Wt 176 lb 6.4 oz (80.015 kg)  BMI 32.26 kg/m2  SpO2 97% General appearance: alert and mild distress Lungs: clear to auscultation bilaterally Heart: regular rate and rhythm Abdomen: abnormal findings:  distended, hypoactive bowel sounds and tenderness to paplation in LLQ.  no rebound or gaurding.    Lab Results  Component Value Date   WBC 7.0 03/08/2010   HGB 11.6* 03/08/2010   HCT 34.3* 03/08/2010   PLT 215.0 03/08/2010   CHOL 150 03/08/2010   TRIG 102.0 03/08/2010   HDL 57.40 03/08/2010   LDLDIRECT 195.3 05/11/2006   ALT 23 03/08/2010   AST 27 03/08/2010   NA 135 09/16/2009   K 3.8 09/16/2009   CL 96 09/16/2009   CREATININE 0.6 09/16/2009   BUN 13 09/16/2009   CO2 26 09/16/2009   TSH 1.13 06/28/2009   HGBA1C 6.7* 03/08/2010   MICROALBUR 0.3 06/28/2009    Assessment:    Abdominal pain, likely secondary to diverticulitis (hx same) .    Plan:  LLQ pain, probable diverticulitis - cipro+flagyl, cont tyelnol and tramadol if needed - follow up GU as planned (prior eval for Upper GI discomfort reviewed) Medications and prior labs reviewed today.   The diagnosis was discussed with  the patient and evaluation and treatment plans outlined. Adhere to simple, bland diet. Follow up as needed.

## 2010-06-02 ENCOUNTER — Other Ambulatory Visit: Payer: Self-pay | Admitting: Endocrinology

## 2010-06-02 MED ORDER — ONDANSETRON HCL 4 MG PO TABS: 4.0000 mg | ORAL_TABLET | ORAL | Status: DC | PRN

## 2010-06-02 NOTE — Telephone Encounter (Signed)
Pt is requesting a rx for her nausea sxs be sent to [facility]. (Per pt she is still having sxs since last week's OV with VAL)

## 2010-06-02 NOTE — Telephone Encounter (Signed)
i did prescribe it--ondansetron.  Please have it filled.

## 2010-06-06 ENCOUNTER — Telehealth: Payer: Self-pay

## 2010-06-06 NOTE — Telephone Encounter (Signed)
[facility] Triage Call Report Triage Record Num: [id number] Operator: Migdalia Dk Patient Name: [facility] Call Date & Time: 06/04/2010 9:25:35PM Patient Phone: [phone] PCP: Romero Belling Patient Gender: Female PCP Fax : [phone] Patient DOB: 24-Sep-1940 Practice Name: [facility] Reason for Call: Pt. states seen in office on 05/31/2010, dx'd with Diverticulitis and put on Cipro and Metronidazole. Started with nausea on 06/03/2010, "I called the office and they gave me Zofran, but its not helping." FSBS 117. Homecare advice given per Diabetes:Gastrointestinal Problems Guideline. Protocol(s) Used: Diabetes: Gastrointestinal Problems Protocol(s) Used: Nausea or Vomiting Recommended Outcome per Protocol: See Provider within 24 hours Reason for Outcome: Known diabetic Symptoms began after beginning new prescription or non-prescription medication(s) or therapy prescribed by provider Care Advice: Follow the usual meal plan if possible. Drink extra non-caloric fluids. If unable to eat at all, drink regular soft drinks and juices so that 50 grams of carbohydrates are taken in every 3 to 4 hours. ~ Medication Advice: - Discontinue all nonprescription and alternative medications, especially stimulants, until evaluated by provider. - Take prescribed medications as directed, following label instructions for the medication. - Do not change medications or dosing regimen until provider is consulted. - Know possible side effects of medication and what to do if they occur. - Tell provider all prescription, nonprescription or alternative medications that you take ~ Diarrheal Care: - Drink 2-3 quarts (2-3 liters) per day of low sugar content fluids, including over the counter oral hydration solution, unless directed otherwise by provider. - If accompanied by vomiting, take the fluids in frequent small sips or suck on ice chips. - Eat easily digested foods (such as bananas, rice,  applesauce, toast, cooked cereals, soup, crackers, baked or boiled potato, or baked chicken or [place] without skin). - Do not eat high fiber, high fat, high sugar content foods, or highly seasoned foods. - Do not drink caffeinated or alcoholic beverages. - Avoid milk and milk products while having symptoms. As symptoms improve, gradually add back to diet. - Application of A&D ointment or witch hazel medicated pads may help anal irritation. - Antidiarrheal medications are usually unnecessary. If symptoms are severe, consider nonprescription antidiarrheal and anti-motility drugs as directed by label or a provider. Do not take if have high fever or bloody diarrhea. If pregnant, do not take any medications not approved by your provider. - Consult your provider for advice regarding continuing prescription medication. ~ 06/04/2010 9:35:23PM Page 1 of 1 [email or web address]

## 2010-06-09 ENCOUNTER — Encounter: Payer: Self-pay | Admitting: Endocrinology

## 2010-06-09 ENCOUNTER — Other Ambulatory Visit (INDEPENDENT_AMBULATORY_CARE_PROVIDER_SITE_OTHER): Payer: Medicare Other

## 2010-06-09 ENCOUNTER — Ambulatory Visit (INDEPENDENT_AMBULATORY_CARE_PROVIDER_SITE_OTHER): Payer: Medicare Other | Admitting: Endocrinology

## 2010-06-09 DIAGNOSIS — E119 Type 2 diabetes mellitus without complications: Secondary | ICD-10-CM

## 2010-06-09 DIAGNOSIS — R109 Unspecified abdominal pain: Secondary | ICD-10-CM | POA: Insufficient documentation

## 2010-06-09 LAB — URINALYSIS, ROUTINE W REFLEX MICROSCOPIC
Bilirubin Urine: NEGATIVE
Ketones, ur: NEGATIVE
Total Protein, Urine: NEGATIVE
Urine Glucose: NEGATIVE
pH: 6 (ref 5.0–8.0)

## 2010-06-09 LAB — CBC WITH DIFFERENTIAL/PLATELET
Basophils Absolute: 0 10*3/uL (ref 0.0–0.1)
Eosinophils Relative: 0.6 % (ref 0.0–5.0)
HCT: 34.7 % — ABNORMAL LOW (ref 36.0–46.0)
Lymphs Abs: 2 10*3/uL (ref 0.7–4.0)
MCV: 81.6 fl (ref 78.0–100.0)
Monocytes Absolute: 0.7 10*3/uL (ref 0.1–1.0)
Neutrophils Relative %: 47.1 % (ref 43.0–77.0)
Platelets: 240 10*3/uL (ref 150.0–400.0)
RDW: 13.8 % (ref 11.5–14.6)
WBC: 5.2 10*3/uL (ref 4.5–10.5)

## 2010-06-09 LAB — MICROALBUMIN / CREATININE URINE RATIO
Creatinine,U: 44.1 mg/dL
Microalb, Ur: 0.3 mg/dL (ref 0.0–1.9)

## 2010-06-09 MED ORDER — CILIDINIUM-CHLORDIAZEPOXIDE 2.5-5 MG PO CAPS: 1.0000 | ORAL_CAPSULE | Freq: Three times a day (TID) | ORAL | Status: DC | PRN

## 2010-06-09 NOTE — Progress Notes (Signed)
Subjective:    [name], female    DOB: 02/23/1940, 70 y.o.   MRN: [id number]  HPI Pt states 1 month of moderate pain at the llq abdomen/left pelvis/left hip.  Little improvement with abx.  No assoc brbpr. Past Medical History  Diagnosis Date  . Gastroparesis   . Iron deficiency anemia, unspecified   . Pure hypercholesterolemia   . Encounter for long-term (current) use of other medications   . Headache   . Unspecified essential hypertension   . Esophageal reflux   . Type II or unspecified type diabetes mellitus without mention of complication, not stated as uncontrolled   . Hematuria     w/u negative  . Arthritis     Spinal Osteoarthritis  . Diverticulosis   . Gastric polyp     Fundic Gland    Past Surgical History  Procedure Date  . Cholecystectomy 1993  . Tonsillectomy     History   Social History  . Marital Status: Married    Spouse Name: N/A    Number of Children: N/A  . Years of Education: N/A   Occupational History  . Not on file.   Social History Main Topics  . Smoking status: Never Smoker   . Smokeless tobacco: Not on file   Comment: Daily Caffeine Use -1  . Alcohol Use: No  . Drug Use: No  . Sexually Active:    Other Topics Concern  . Not on file   Social History Narrative  . No narrative on file    Current Outpatient Prescriptions on File Prior to Visit  Medication Sig Dispense Refill  . aspirin 81 MG tablet Take 81 mg by mouth daily.        Marland Kitchen atorvastatin (LIPITOR) 80 MG tablet Take 40 mg by mouth daily.        . celecoxib (CELEBREX) 200 MG capsule Take 200 mg by mouth daily.        Marland Kitchen esomeprazole (NEXIUM) 40 MG capsule Take 40 mg by mouth daily.        . folic acid-pyridoxine-cyancobalamin (FOLTX) 2.5-25-2 MG TABS Take 1 tablet by mouth daily.        . Glucose Blood (ASCENSIA CONTOUR TEST VI) 1 each by In Vitro route 4 (four) times daily.        . metFORMIN (GLUCOPHAGE-XR) 500 MG 24 hr tablet Take 1,000 mg by mouth 2  (two) times daily.        . metoCLOPramide (REGLAN) 5 MG tablet Take 5 mg by mouth 2 (two) times daily. Before breakfast and at bedtime       . Multiple Vitamin (MULTIVITAMIN) tablet Take 1 tablet by mouth daily.        . Multiple Vitamins-Minerals (VISION FORMULA PO) Take by mouth.        . ondansetron (ZOFRAN) 4 MG tablet Take 1 tablet (4 mg total) by mouth every 4 (four) hours as needed.  30 tablet  1  . traMADol (ULTRAM) 50 MG tablet Take 1 tablet (50 mg total) by mouth every 6 (six) hours as needed for pain.  20 tablet  0  . valsartan-hydrochlorothiazide (DIOVAN-HCT) 320-25 MG per tablet Take 1 tablet by mouth daily.        . vitamin E 100 UNIT capsule Take by mouth daily.          No Known Allergies  Family History  Problem Relation Age of Onset  . Diabetes Mother   . Stroke Father   .  Heart disease Sister   . Heart disease Brother     BP 120/68  Pulse 73  Temp(Src) 98.4 F (36.9 C) (Oral)  Ht 5\' 2"  (1.575 m)  Wt 171 lb 9.6 oz (77.837 kg)  BMI 31.39 kg/m2  SpO2 98%    Review of Systems Denies fever and hematuria.    Objective:   Physical Exam GENERAL: no distress ABDOMEN: abdomen is soft, nontender.  no hepatosplenomegaly.   not distended.  no hernia Msk:  Left hip is nontender.     Lab Results  Component Value Date   WBC 5.2 06/09/2010   HGB 11.8* 06/09/2010   HCT 34.7* 06/09/2010   PLT 240.0 06/09/2010   CHOL 150 03/08/2010   TRIG 102.0 03/08/2010   HDL 57.40 03/08/2010   LDLDIRECT 195.3 05/11/2006   ALT 23 03/08/2010   AST 27 03/08/2010   NA 135 09/16/2009   K 3.8 09/16/2009   CL 96 09/16/2009   CREATININE 0.6 09/16/2009   BUN 13 09/16/2009   CO2 26 09/16/2009   TSH 1.13 06/28/2009   HGBA1C 6.6* 06/09/2010   MICROALBUR 0.3 06/09/2010      Assessment & Plan:  Abdominal pain, new.  uncertain etiology Anemia, mild, unchanged Dm, well-controlled

## 2010-06-09 NOTE — Patient Instructions (Addendum)
blood tests are being ordered for you today.  please call [phone] to hear your test results.  You will be prompted to enter the 9-digit "MRN" number that appears at the top left of this page, followed by #.  Then you will hear the message. i've also requested for you a ct scan.  you will be called with a day and time for an appointment.  Take miralax, 2x a day, according to the label.   Here is a prescription for "librax," as needed for your symptoms. (update: i left message on phone-tree:  rx as we discussed)

## 2010-06-10 ENCOUNTER — Telehealth: Payer: Self-pay

## 2010-06-10 DIAGNOSIS — R109 Unspecified abdominal pain: Secondary | ICD-10-CM

## 2010-06-10 NOTE — Telephone Encounter (Signed)
please call patient: It is safe to hold the glucophage for a few days

## 2010-06-10 NOTE — Telephone Encounter (Signed)
Rose from CT also called stating that pt needs to have BUN and Creatinine drawn for CT scheduled Monday. Per the Lab, pt needs orders and they cannot to test with specimens drawn yesterday.

## 2010-06-10 NOTE — Telephone Encounter (Signed)
Pt called stating she is scheduled for CT scan Monday and was advised to HOLD Glucophage. Pt is unsure is she should hold medication prior to scan or for the entire day, please advise.

## 2010-06-10 NOTE — Telephone Encounter (Signed)
Please advise; does pt need to HOLD Glucophage for the entire day of scheduled CT scan or only morning dose prior to scan?

## 2010-06-10 NOTE — Telephone Encounter (Signed)
Per Rose from CT, pt has canceled CT scan because pt is unsure if she should hold Glucophage

## 2010-06-10 NOTE — Telephone Encounter (Signed)
i ordered

## 2010-06-13 ENCOUNTER — Other Ambulatory Visit: Payer: Medicare Other

## 2010-06-13 NOTE — Telephone Encounter (Signed)
Pt decided to cancel CT at this time. Will reschedule after appt with SAE

## 2010-06-20 ENCOUNTER — Ambulatory Visit: Payer: Medicare Other | Admitting: Endocrinology

## 2010-06-21 NOTE — Assessment & Plan Note (Signed)
[facility]                         GASTROENTEROLOGY OFFICE NOTE   Valerie, [name]                    MRN:          [id number]  DATE:04/16/2007                            DOB:          [birth year]    REQUESTING PHYSICIAN:  Gregary Signs A. Everardo All, MD   REASON FOR CONSULTATION:  Epigastric pan, reflux.   ASSESSMENT:  A 70 year old Asian woman who has:  1. Gastroesophageal reflux disease with hiatal hernia found at EGD      August 30, 2000 [facility]).  2. Gastroparesis.  3. Bloating and discomfort consistent with irritable bowel syndrome      versus diabetic gut problems.  4. 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   RECOMMENDATIONS/PLAN:  1. She should avoid greasy foods which tend to precipitate reflux.      She has regurgitation at night at times.  She should try to avoid      eating prior to bed.  She has been taking some Activia at bedtime      and yogurt.  2. Continue current medications to include Nexium and metoclopramide.      She has not suffered any obvious ill effects from metoclopramide.      She was warned about the possibility of neuropathy and abnormal      tongue and lip movements with tardive dyskinesia.  However, based      upon what I hear, it sounds like this has been an effect vie      medicine for her.  In fact, some of regurgitation episodes may be      related to noncompliance with the medication at bedtime.  3. Trial of Align daily for bloating and gaseousness.  4. Screening colonoscopy is indicated to be repeated in July of 2012.      Last one was August 30, 2000 in [place].  She had      diverticulosis, but no polyps.  5. Otherwise, return to see me as needed.   HISTORY:  69 year old Asian woman with problems as described  above.  Once a week or maybe once a month, it varies, she will  regurgitate at night which wakes her up.  Otherwise, she seems to do  well on the Nexium.  IF she eats fatty  foods, she may have some  epigastric discomfort and problems.  She has been on the metoclopramide  for six years after she underwent a gastric emptying study (she  described this perfectly) and said that it was abnormal.  I do not have  those records.  She does have some bloating and gaseousness with certain  foods, particularly fatty foods.  Her gallbladder is already gone.  These symptoms really are chronic and stable, and are not that often as  she is compliant and takes her medicine,so she tells me.  She wanted to  establish with me and see if there is any other change in therapy or any  need for other investigation at this time.   MEDICATIONS:  1. Diovan HCTZ 320/25 daily.  2. Nexium 40 mg daily.  3. Glucophage 500 mg 2  in the morning and 2 at bedtime.  4. Lipitor 40 mg daily.  5. Metoclopramide 5 mg supposed to be before meals and bedtime.  6. Foltx daily.  7. Vitamin E daily.  8. Aspirin 81 mg daily.  9. Citrucel daily.   There are no known dug allergies.   PAST MEDICAL HISTORY:  1. Hypertension.  2. Diabetes mellitus type 2.  3. Gastroesophageal reflux disease.  4. Gastric paresis.  5. Osteoarthritis.  6. Prior cholecystectomy 1993.  7. Prior tonsillectomy.  8. Diverticulosis.   FAMILY HISTORY:  Brothers and sisters have heart disease.  Diabetes in  her mother.  No colon cancer.   SOCIAL HISTORY:  She is married, lives with her spouse.  She is employed  in [employer].  There is no alcohol, tobacco or drugs.   REVIEW OF SYSTEMS:  Eyeglasses, osteoarthritis, excessive thirst, sore  throat a month ago, fibrocystic breast disease with occasional lumps  associated with that, occasional muscle pain.  All other systems are  negative.   PHYSICAL EXAMINATION:  Reveals a well developed, well nourished, obese  Asian woman.  Height 5 feet 2 inches, weight 181.  Blood pressure  130/72, pulse 78.  EYES:  Anicteric.  NECK:  Supple, no mass.  CHEST:  Clear.  HEART:  S1, S2.  No  rubs or gallops.  ABDOMEN:  Soft, nontender without organomegaly or mass.  There is no  percussion splash.  EXTREMITIES:  Free of edema.  SKIN:  No acute rash.  PSYCH:  She is alert and oriented x3.  Appropriate affect.  NEURO:  Cranial nerves II-XII intact.  Grossly nonfocal.   I have reviewed the endoscopy notes she has brought with her.  I have  also reviewed the biopsy reports form her fundic gland polyps.  I  appreciate the opportunity to care for this patient.     Iva Boop, MD,FACG  Electronically Signed    CEG/MedQ  DD: 04/16/2007  DT: 04/16/2007  Job #: [id number]   cc:   Gregary Signs A. Everardo All, MD

## 2010-06-23 ENCOUNTER — Ambulatory Visit (INDEPENDENT_AMBULATORY_CARE_PROVIDER_SITE_OTHER): Payer: Medicare Other | Admitting: Endocrinology

## 2010-06-23 ENCOUNTER — Encounter: Payer: Self-pay | Admitting: Endocrinology

## 2010-06-23 VITALS — BP 118/68 | HR 76 | Temp 98.0°F | Ht 62.0 in | Wt 172.0 lb

## 2010-06-23 DIAGNOSIS — D509 Iron deficiency anemia, unspecified: Secondary | ICD-10-CM

## 2010-06-23 NOTE — Patient Instructions (Addendum)
You can hold-off on the ct scan for now.    Take miralax, 2x a day, according to the label.   continue "librax," as needed for your symptoms. Please make a follow-up appointment in 3 months. (update: i left message on phone-tree:  rx as we discussed)

## 2010-06-23 NOTE — Progress Notes (Signed)
Subjective:    [name], female    DOB: Oct 10, 1940, 70 y.o.   MRN: [id number]  HPI The state of at least three ongoing medical problems is addressed today: abd pain is almost completely resolved. She takes fe 1/day.  Denies brbpr. She c/o chronic constipation. She did not take the miralax. Past Medical History  Diagnosis Date  . Gastroparesis   . Iron deficiency anemia, unspecified   . Pure hypercholesterolemia   . Encounter for long-term (current) use of other medications   . Headache   . Unspecified essential hypertension   . Esophageal reflux   . Type II or unspecified type diabetes mellitus without mention of complication, not stated as uncontrolled   . Hematuria     w/u negative  . Arthritis     Spinal Osteoarthritis  . Diverticulosis   . Gastric polyp     Fundic Gland    Past Surgical History  Procedure Date  . Cholecystectomy 1993  . Tonsillectomy     History   Social History  . Marital Status: Married    Spouse Name: N/A    Number of Children: N/A  . Years of Education: N/A   Occupational History  . Not on file.   Social History Main Topics  . Smoking status: Never Smoker   . Smokeless tobacco: Not on file   Comment: Daily Caffeine Use -1  . Alcohol Use: No  . Drug Use: No  . Sexually Active:    Other Topics Concern  . Not on file   Social History Narrative  . No narrative on file    Current Outpatient Prescriptions on File Prior to Visit  Medication Sig Dispense Refill  . aspirin 81 MG tablet Take 81 mg by mouth daily.        Marland Kitchen atorvastatin (LIPITOR) 80 MG tablet Take 40 mg by mouth daily.        . celecoxib (CELEBREX) 200 MG capsule Take 200 mg by mouth daily.        Marland Kitchen esomeprazole (NEXIUM) 40 MG capsule Take 40 mg by mouth daily.        . folic acid-pyridoxine-cyancobalamin (FOLTX) 2.5-25-2 MG TABS Take 1 tablet by mouth daily.        . Glucose Blood (ASCENSIA CONTOUR TEST VI) 1 each by In Vitro route 4 (four) times  daily.        . metFORMIN (GLUCOPHAGE-XR) 500 MG 24 hr tablet Take 1,000 mg by mouth 2 (two) times daily.        . metoCLOPramide (REGLAN) 5 MG tablet Take 5 mg by mouth 2 (two) times daily. Before breakfast and at bedtime       . Multiple Vitamin (MULTIVITAMIN) tablet Take 1 tablet by mouth daily.        . Multiple Vitamins-Minerals (VISION FORMULA PO) Take by mouth.        . ondansetron (ZOFRAN) 4 MG tablet Take 1 tablet (4 mg total) by mouth every 4 (four) hours as needed.  30 tablet  1  . valsartan-hydrochlorothiazide (DIOVAN-HCT) 320-25 MG per tablet Take 1 tablet by mouth daily.        . vitamin E 100 UNIT capsule Take by mouth daily.        . clidinium-chlordiazePOXIDE (LIBRAX) 2.5-5 MG per capsule Take 1 capsule by mouth 3 (three) times daily as needed. For pain  60 capsule  1  . traMADol (ULTRAM) 50 MG tablet Take 1 tablet (50 mg total) by  mouth every 6 (six) hours as needed for pain.  20 tablet  0    No Known Allergies  Family History  Problem Relation Age of Onset  . Diabetes Mother   . Stroke Father   . Heart disease Sister   . Heart disease Brother     BP 118/68  Pulse 76  Temp(Src) 98 F (36.7 C) (Oral)  Ht 5\' 2"  (1.575 m)  Wt 172 lb (78.019 kg)  BMI 31.46 kg/m2  SpO2 97%    Review of Systems Denies fever and dysuria.    Objective:   Physical Exam GENERAL: no distress ABDOMEN: abdomen is soft, nontender.  no hepatosplenomegaly.   not distended.  no hernia    Lab Results  Component Value Date   WBC 5.2 06/09/2010   HGB 11.8* 06/09/2010   HCT 34.7* 06/09/2010   PLT 240.0 06/09/2010   CHOL 150 03/08/2010   TRIG 102.0 03/08/2010   HDL 57.40 03/08/2010   LDLDIRECT 195.3 05/11/2006   ALT 23 03/08/2010   AST 27 03/08/2010   NA 135 09/16/2009   K 3.8 09/16/2009   CL 96 09/16/2009   CREATININE 0.6 09/16/2009   BUN 13 09/16/2009   CO2 26 09/16/2009   TSH 1.13 06/28/2009   HGBA1C 6.6* 06/09/2010   MICROALBUR 0.3 06/09/2010     Assessment & Plan:  abd pain, uncertain  etiology, improved. fe-deficiency anemia, improved Constipation, needs increased rx

## 2010-07-15 ENCOUNTER — Other Ambulatory Visit: Payer: Self-pay | Admitting: *Deleted

## 2010-07-15 ENCOUNTER — Encounter: Payer: Self-pay | Admitting: Internal Medicine

## 2010-07-15 ENCOUNTER — Ambulatory Visit (INDEPENDENT_AMBULATORY_CARE_PROVIDER_SITE_OTHER): Payer: Medicare Other | Admitting: *Deleted

## 2010-07-15 ENCOUNTER — Telehealth: Payer: Self-pay | Admitting: Family Medicine

## 2010-07-15 ENCOUNTER — Other Ambulatory Visit: Payer: Self-pay | Admitting: Internal Medicine

## 2010-07-15 ENCOUNTER — Ambulatory Visit (INDEPENDENT_AMBULATORY_CARE_PROVIDER_SITE_OTHER): Payer: Medicare Other | Admitting: Internal Medicine

## 2010-07-15 VITALS — BP 130/62 | HR 78 | Temp 98.2°F | Ht 62.0 in | Wt 173.5 lb

## 2010-07-15 DIAGNOSIS — H6592 Unspecified nonsuppurative otitis media, left ear: Secondary | ICD-10-CM | POA: Insufficient documentation

## 2010-07-15 DIAGNOSIS — I10 Essential (primary) hypertension: Secondary | ICD-10-CM

## 2010-07-15 DIAGNOSIS — Z0000 Encounter for general adult medical examination without abnormal findings: Secondary | ICD-10-CM | POA: Insufficient documentation

## 2010-07-15 DIAGNOSIS — M79605 Pain in left leg: Secondary | ICD-10-CM

## 2010-07-15 DIAGNOSIS — M79609 Pain in unspecified limb: Secondary | ICD-10-CM

## 2010-07-15 DIAGNOSIS — H6590 Unspecified nonsuppurative otitis media, unspecified ear: Secondary | ICD-10-CM

## 2010-07-15 MED ORDER — TRIAMCINOLONE ACETONIDE 0.1 % EX CREA: TOPICAL_CREAM | Freq: Two times a day (BID) | CUTANEOUS | Status: DC

## 2010-07-15 NOTE — Assessment & Plan Note (Addendum)
With ? superfic phlebitis post left knee and lateral leg;  Will ask for doppler u/s;  To cont the asa for now;  Has no rash but will also try triam cream for the left lateral thigh area of concern that has so much itching; would also consider vein clinic if eval and tx not helpful

## 2010-07-15 NOTE — Progress Notes (Signed)
Subjective:    [name], female    DOB: Apr 05, 1940, 70 y.o.   MRN: [id number]  HPI  Here with acute onset mild to mod 2-3 days ST, HA, left ear pain, fever, general weakness and malaise, but Pt denies chest pain, increased sob or doe, wheezing, orthopnea, PND, increased LE swelling, palpitations, dizziness or syncope. Pt denies new neurological symptoms such as new headache, or facial or extremity weakness or numbness   Pt denies polydipsia, polyuria.  Also with c/o 1 wk mild to mod left lateral thigh itch, numbness and discomfort for approx 1 wk.  Denies LBP, pelvic or groin pain, radicular pain or other RLE or LLE pain.  No specific rash but has numerous varicosities about the area she indicates, not clear if worse recently.  Takes celebrex prn only and not taken lately so not clear if helping.  Has ultram but not using that lately as well.  No recent med changes or other OTC meds.  No hive like rash, Pt denies chest pain, increased sob or doe, wheezing, orthopnea, PND, increased LE swelling, palpitations, dizziness or syncope.  Pt denies polydipsia, polyuria, or low sugar symptoms such as weakness or confusion improved with po intake.  Pt states overall good compliance with meds, trying to follow lower cholesterol, diabetic diet, wt overall stable but little exercise however.    Past Medical History  Diagnosis Date  . Gastroparesis   . Iron deficiency anemia, unspecified   . Pure hypercholesterolemia   . Encounter for long-term (current) use of other medications   . Headache   . Unspecified essential hypertension   . Esophageal reflux   . Type II or unspecified type diabetes mellitus without mention of complication, not stated as uncontrolled   . Hematuria     w/u negative  . Arthritis     Spinal Osteoarthritis  . Diverticulosis   . Gastric polyp     Fundic Gland   Past Surgical History  Procedure Date  . Cholecystectomy 1993  . Tonsillectomy     reports that she  has never smoked. She does not have any smokeless tobacco history on file. She reports that she does not drink alcohol or use illicit drugs. family history includes Diabetes in her mother; Heart disease in her brother and sister; and Stroke in her father. No Known Allergies Current Outpatient Prescriptions on File Prior to Visit  Medication Sig Dispense Refill  . aspirin 81 MG tablet Take 81 mg by mouth daily.        Marland Kitchen atorvastatin (LIPITOR) 80 MG tablet Take 40 mg by mouth daily.        . celecoxib (CELEBREX) 200 MG capsule Take 200 mg by mouth daily.        . clidinium-chlordiazePOXIDE (LIBRAX) 2.5-5 MG per capsule Take 1 capsule by mouth 3 (three) times daily as needed. For pain  60 capsule  1  . esomeprazole (NEXIUM) 40 MG capsule Take 40 mg by mouth daily.        . folic acid-pyridoxine-cyancobalamin (FOLTX) 2.5-25-2 MG TABS Take 1 tablet by mouth daily.        . Glucose Blood (ASCENSIA CONTOUR TEST VI) 1 each by In Vitro route 4 (four) times daily.        . metFORMIN (GLUCOPHAGE-XR) 500 MG 24 hr tablet Take 1,000 mg by mouth 2 (two) times daily.        . metoCLOPramide (REGLAN) 5 MG tablet Take 5 mg by mouth 2 (two)  times daily. Before breakfast and at bedtime       . Multiple Vitamin (MULTIVITAMIN) tablet Take 1 tablet by mouth daily.        . Multiple Vitamins-Minerals (VISION FORMULA PO) Take by mouth.        . ondansetron (ZOFRAN) 4 MG tablet Take 1 tablet (4 mg total) by mouth every 4 (four) hours as needed.  30 tablet  1  . traMADol (ULTRAM) 50 MG tablet Take 1 tablet (50 mg total) by mouth every 6 (six) hours as needed for pain.  20 tablet  0  . valsartan-hydrochlorothiazide (DIOVAN-HCT) 320-25 MG per tablet Take 1 tablet by mouth daily.        . vitamin E 100 UNIT capsule Take by mouth daily.         Review of Systems All otherwise neg per pt     Objective:   Physical Exam BP 130/62  Pulse 78  Temp(Src) 98.2 F (36.8 C) (Oral)  Ht 5\' 2"  (1.575 m)  Wt 173 lb 8 oz (78.699  kg)  BMI 31.73 kg/m2  SpO2 96% Physical Exam  VS noted, mild ill Constitutional: Pt appears well-developed and well-nourished.  HENT: Head: Normocephalic.  Right Ear: External ear normal.  Left Ear: External ear normal.  Bilat tm's mild erythema, left with mild effusion.  Sinus nontender.  Pharynx mild erythema Eyes: Conjunctivae and EOM are normal. Pupils are equal, round, and reactive to light.  Neck: Normal range of motion. Neck supple.  Cardiovascular: Normal rate and regular rhythm.   Pulmonary/Chest: Effort normal and breath sounds normal.  Abd:  Soft, NT, non-distended, + BS No spine tender Neurological: Pt is alert. No cranial nerve deficit . LE motor/sens intact Skin: Skin is warm. No erythema. No rash, but numerous varicosities to left lateral thigh without swelling, cords, or tender; some tender noted post left knee as well without definite cord, swelling, redness Psychiatric: Pt behavior is normal. Thought content normal.         Assessment & Plan:

## 2010-07-15 NOTE — Patient Instructions (Signed)
Take all new medications as prescribed - the steroid cream for the leg You can also take Mucinex and allegra for the left ear fullness and discomfort You will be contacted regarding the referral for: left leg doppler u/s to see if you have blood clot in the back of the knee and side of the leg Continue all other medications as before, including the aspirin for now

## 2010-07-15 NOTE — Telephone Encounter (Signed)
Vascular lab called--- pt neg for DVT

## 2010-07-17 ENCOUNTER — Encounter: Payer: Self-pay | Admitting: Internal Medicine

## 2010-07-17 NOTE — Assessment & Plan Note (Signed)
Mild, for mucinex otc prn and allegra prn as well

## 2010-07-17 NOTE — Assessment & Plan Note (Signed)
stable overall by hx and exam, most recent data reviewed with pt, and pt to continue medical treatment as before  BP Readings from Last 3 Encounters:  07/15/10 130/62  06/23/10 118/68  06/09/10 120/68

## 2010-07-19 ENCOUNTER — Encounter: Payer: Self-pay | Admitting: Internal Medicine

## 2010-07-19 NOTE — Progress Notes (Signed)
Quick Note:  Voice message left on PhoneTree system - lab is negative, normal or otherwise stable, pt to continue same tx ______ 

## 2010-07-25 ENCOUNTER — Other Ambulatory Visit (INDEPENDENT_AMBULATORY_CARE_PROVIDER_SITE_OTHER): Payer: Medicare Other

## 2010-07-25 ENCOUNTER — Ambulatory Visit (INDEPENDENT_AMBULATORY_CARE_PROVIDER_SITE_OTHER): Payer: Medicare Other | Admitting: Endocrinology

## 2010-07-25 DIAGNOSIS — D509 Iron deficiency anemia, unspecified: Secondary | ICD-10-CM

## 2010-07-25 DIAGNOSIS — R109 Unspecified abdominal pain: Secondary | ICD-10-CM

## 2010-07-25 LAB — IBC PANEL
Iron: 66 ug/dL (ref 42–145)
Transferrin: 375.2 mg/dL — ABNORMAL HIGH (ref 212.0–360.0)

## 2010-07-25 LAB — CBC WITH DIFFERENTIAL/PLATELET
Basophils Absolute: 0.1 10*3/uL (ref 0.0–0.1)
Eosinophils Relative: 0.5 % (ref 0.0–5.0)
HCT: 35.7 % — ABNORMAL LOW (ref 36.0–46.0)
Lymphs Abs: 2.3 10*3/uL (ref 0.7–4.0)
MCV: 81.4 fl (ref 78.0–100.0)
Monocytes Absolute: 0.7 10*3/uL (ref 0.1–1.0)
Monocytes Relative: 9.7 % (ref 3.0–12.0)
Neutrophils Relative %: 56.8 % (ref 43.0–77.0)
Platelets: 232 10*3/uL (ref 150.0–400.0)
RDW: 13.5 % (ref 11.5–14.6)
WBC: 7.2 10*3/uL (ref 4.5–10.5)

## 2010-07-25 LAB — BASIC METABOLIC PANEL
BUN: 13 mg/dL (ref 6–23)
Creatinine, Ser: 0.5 mg/dL (ref 0.4–1.2)
GFR: 146.7 mL/min (ref >60.00)
Glucose, Bld: 92 mg/dL (ref 70–99)

## 2010-07-25 MED ORDER — CILIDINIUM-CHLORDIAZEPOXIDE 2.5-5 MG PO CAPS: 1.0000 | ORAL_CAPSULE | Freq: Three times a day (TID) | ORAL | Status: DC | PRN

## 2010-07-25 MED ORDER — VALSARTAN-HYDROCHLOROTHIAZIDE 320-12.5 MG PO TABS: 1.0000 | ORAL_TABLET | Freq: Every day | ORAL | Status: DC

## 2010-07-25 NOTE — Progress Notes (Signed)
Subjective:    [name], female    DOB: Jan 27, 1941, 70 y.o.   MRN: [id number]  HPI Pt states 1 month of constant moderate pain at the periumbilical area, and at the pelvis.  No assoc n/v. Constipation persists. She takes fe tabs only intermittently. Past Medical History  Diagnosis Date  . Gastroparesis   . Iron deficiency anemia, unspecified   . Pure hypercholesterolemia   . Encounter for long-term (current) use of other medications   . Headache   . Unspecified essential hypertension   . Esophageal reflux   . Type II or unspecified type diabetes mellitus without mention of complication, not stated as uncontrolled   . Hematuria     w/u negative  . Arthritis     Spinal Osteoarthritis  . Diverticulosis   . Gastric polyp     Fundic Gland    Past Surgical History  Procedure Date  . Cholecystectomy 1993  . Tonsillectomy     History   Social History  . Marital Status: Married    Spouse Name: N/A    Number of Children: N/A  . Years of Education: N/A   Occupational History  . Not on file.   Social History Main Topics  . Smoking status: Never Smoker   . Smokeless tobacco: Not on file   Comment: Daily Caffeine Use -1  . Alcohol Use: No  . Drug Use: No  . Sexually Active:    Other Topics Concern  . Not on file   Social History Narrative  . No narrative on file    Current Outpatient Prescriptions on File Prior to Visit  Medication Sig Dispense Refill  . aspirin 81 MG tablet Take 81 mg by mouth daily.        Marland Kitchen atorvastatin (LIPITOR) 80 MG tablet Take 40 mg by mouth daily.        . celecoxib (CELEBREX) 200 MG capsule Take 200 mg by mouth daily.        . clidinium-chlordiazePOXIDE (LIBRAX) 2.5-5 MG per capsule Take 1 capsule by mouth 3 (three) times daily as needed. For pain  60 capsule  1  . esomeprazole (NEXIUM) 40 MG capsule Take 40 mg by mouth daily.        . folic acid-pyridoxine-cyancobalamin (FOLTX) 2.5-25-2 MG TABS Take 1 tablet by  mouth daily.        . Glucose Blood (ASCENSIA CONTOUR TEST VI) 1 each by In Vitro route 4 (four) times daily.        . metFORMIN (GLUCOPHAGE-XR) 500 MG 24 hr tablet Take 1,000 mg by mouth 2 (two) times daily.        . metoCLOPramide (REGLAN) 5 MG tablet Take 5 mg by mouth 2 (two) times daily. Before breakfast and at bedtime       . Multiple Vitamin (MULTIVITAMIN) tablet Take 1 tablet by mouth daily.        . Multiple Vitamins-Minerals (VISION FORMULA PO) Take by mouth.        . ondansetron (ZOFRAN) 4 MG tablet Take 1 tablet (4 mg total) by mouth every 4 (four) hours as needed.  30 tablet  1  . traMADol (ULTRAM) 50 MG tablet Take 1 tablet (50 mg total) by mouth every 6 (six) hours as needed for pain.  20 tablet  0  . triamcinolone (KENALOG) 0.1 % cream Apply topically 2 (two) times daily.  30 g  0  . valsartan-hydrochlorothiazide (DIOVAN-HCT) 320-25 MG per tablet Take 1 tablet by  mouth daily.        . vitamin E 100 UNIT capsule Take by mouth daily.          No Known Allergies  Family History  Problem Relation Age of Onset  . Diabetes Mother   . Stroke Father   . Heart disease Sister   . Heart disease Brother     BP 122/66  Pulse 72  Temp(Src) 97.5 F (36.4 C) (Oral)  Ht 5\' 2"  (1.575 m)  Wt 171 lb 1.9 oz (77.62 kg)  BMI 31.30 kg/m2  SpO2 93% Review of Systems Denies fever and diarrhea.    Objective:   Physical Exam GENERAL: no distress ABDOMEN: soft, nontender.  no hepatosplenomegaly.   not distended.  no hernia    Labs: hyponatremia is noted  Assessment & Plan:  recurrent abd pain, uncertain etiology Constipation, ? Related to pain fe-deficiency anemia, therapy limited by noncompliance.  i'll do the best i can. Hyponatremia, new

## 2010-07-25 NOTE — Patient Instructions (Addendum)
blood tests are being ordered for you today.  please call [phone] to hear your test results.  You will be prompted to enter the 9-digit "MRN" number that appears at the top left of this page, followed by #.  Then you will hear the message. Let's check the ct scan.  you will be called with a day and time for an appointment. Try "miralax" 2x a day, to see if this helps your symptoms. (update: i left message on phone-tree:  Change diovan-hct to 320/12.5, 1/qd.  Ret 30d).

## 2010-07-26 ENCOUNTER — Other Ambulatory Visit: Payer: Self-pay

## 2010-07-26 MED ORDER — VALSARTAN-HYDROCHLOROTHIAZIDE 320-12.5 MG PO TABS: 1.0000 | ORAL_TABLET | Freq: Every day | ORAL | Status: DC

## 2010-07-29 ENCOUNTER — Telehealth: Payer: Self-pay | Admitting: Cardiology

## 2010-07-29 NOTE — Telephone Encounter (Signed)
Pt aware ok to decrease dose.

## 2010-07-29 NOTE — Telephone Encounter (Signed)
Ok to reduce dose ?

## 2010-07-29 NOTE — Telephone Encounter (Signed)
Per pt call Dr. Everardo All wants to change b/p meds and pt wants to talk to Dr. Antoine Poche about if first

## 2010-07-29 NOTE — Telephone Encounter (Signed)
Dr Everardo All decreased Diovan/HCT from 320/25 to 320/12.5 and they want Dr Hochrein's approval before they make the change.  Will discuss with him and call wife back with instructions

## 2010-08-09 ENCOUNTER — Other Ambulatory Visit: Payer: Self-pay | Admitting: Interventional Radiology

## 2010-08-09 DIAGNOSIS — I83819 Varicose veins of unspecified lower extremities with pain: Secondary | ICD-10-CM

## 2010-08-18 ENCOUNTER — Ambulatory Visit (INDEPENDENT_AMBULATORY_CARE_PROVIDER_SITE_OTHER)
Admission: RE | Admit: 2010-08-18 | Discharge: 2010-08-18 | Disposition: A | Discharge status: Home / Self Care | Payer: Medicare Other | Source: Ambulatory Visit | Attending: Endocrinology | Admitting: Endocrinology

## 2010-08-18 DIAGNOSIS — R109 Unspecified abdominal pain: Secondary | ICD-10-CM

## 2010-08-18 MED ORDER — IOHEXOL 300 MG/ML  SOLN
80.0000 mL | Freq: Once | INTRAMUSCULAR | Status: AC | PRN
  Administered 2010-08-18: 80 mL via INTRAVENOUS

## 2010-08-19 ENCOUNTER — Telehealth: Payer: Self-pay | Admitting: Endocrinology

## 2010-08-19 DIAGNOSIS — R634 Abnormal weight loss: Secondary | ICD-10-CM

## 2010-08-19 NOTE — Telephone Encounter (Signed)
i left message on phone tree.  Ref back to dr Leone Payor, for abnormal ct.

## 2010-08-22 ENCOUNTER — Telehealth: Payer: Self-pay | Admitting: *Deleted

## 2010-08-22 ENCOUNTER — Other Ambulatory Visit: Payer: Self-pay | Admitting: Endocrinology

## 2010-08-22 NOTE — Telephone Encounter (Signed)
Please resume metformin

## 2010-08-22 NOTE — Telephone Encounter (Signed)
Pt states that the message concerning her CT was cot off and she did not get the full message & is concerned. Phoned Pt and informed her that note states that she will be seeing Dr Leone Payor in GI and that she has OV appointment tomorrow at 2:30pm that she needs to keep to discuss any concerns w/Dr Everardo All. [medication was another concern/glucophage-paperwork instructs her not to restart until seen by PCP].

## 2010-08-23 ENCOUNTER — Encounter: Payer: Self-pay | Admitting: Endocrinology

## 2010-08-23 ENCOUNTER — Ambulatory Visit (INDEPENDENT_AMBULATORY_CARE_PROVIDER_SITE_OTHER): Payer: Medicare Other | Admitting: Endocrinology

## 2010-08-23 ENCOUNTER — Other Ambulatory Visit (INDEPENDENT_AMBULATORY_CARE_PROVIDER_SITE_OTHER): Payer: Medicare Other

## 2010-08-23 DIAGNOSIS — E871 Hypo-osmolality and hyponatremia: Secondary | ICD-10-CM

## 2010-08-23 DIAGNOSIS — D509 Iron deficiency anemia, unspecified: Secondary | ICD-10-CM

## 2010-08-23 DIAGNOSIS — E119 Type 2 diabetes mellitus without complications: Secondary | ICD-10-CM

## 2010-08-23 LAB — CBC WITH DIFFERENTIAL/PLATELET
Basophils Relative: 0.8 % (ref 0.0–3.0)
Eosinophils Relative: 1.1 % (ref 0.0–5.0)
HCT: 34 % — ABNORMAL LOW (ref 36.0–46.0)
Hemoglobin: 11.5 g/dL — ABNORMAL LOW (ref 12.0–15.0)
Lymphs Abs: 2.4 10*3/uL (ref 0.7–4.0)
MCV: 80.9 fl (ref 78.0–100.0)
Monocytes Absolute: 0.7 10*3/uL (ref 0.1–1.0)
Monocytes Relative: 10.2 % (ref 3.0–12.0)
Neutro Abs: 3.3 10*3/uL (ref 1.4–7.7)
RBC: 4.2 Mil/uL (ref 3.87–5.11)
WBC: 6.5 10*3/uL (ref 4.5–10.5)

## 2010-08-23 LAB — BASIC METABOLIC PANEL
Chloride: 100 mEq/L (ref 96–112)
GFR: 126.94 mL/min (ref >60.00)
Potassium: 3.7 mEq/L (ref 3.5–5.1)
Sodium: 136 mEq/L (ref 135–145)

## 2010-08-23 NOTE — Patient Instructions (Addendum)
blood tests are being ordered for you today.  please call [phone] to hear your test results.  You will be prompted to enter the 9-digit "MRN" number that appears at the top left of this page, followed by #.  Then you will hear the message. pending the test results, please continue the same medications for now Please keep your appointment with dr Leone Payor. Please make a follow-up appointment in 3 months. (update: i left message on phone-tree:  Same rx, including fe tabs.  Hyponatremia is resolved).

## 2010-08-23 NOTE — Telephone Encounter (Signed)
Pt informed of MD's advisement. 

## 2010-08-23 NOTE — Progress Notes (Signed)
Subjective:    [name], female    DOB: 01-Dec-1940, 70 y.o.   MRN: [id number]  HPI Pt has no improvement in her abd pain.  It has been present x 1 month, and is mild.  It is improved recently.  No assoc vomiting, but she has some nausea.  No weight change She takes fe 2/d She now takes the reduced dosage of diovan-hct.  Past Medical History  Diagnosis Date  . Gastroparesis   . Iron deficiency anemia, unspecified   . Pure hypercholesterolemia   . Encounter for long-term (current) use of other medications   . Headache   . Unspecified essential hypertension   . Esophageal reflux   . Type II or unspecified type diabetes mellitus without mention of complication, not stated as uncontrolled   . Hematuria     w/u negative  . Arthritis     Spinal Osteoarthritis  . Diverticulosis   . Gastric polyp     Fundic Gland    Past Surgical History  Procedure Date  . Cholecystectomy 1993  . Tonsillectomy     History   Social History  . Marital Status: Married    Spouse Name: N/A    Number of Children: N/A  . Years of Education: N/A   Occupational History  . Not on file.   Social History Main Topics  . Smoking status: Never Smoker   . Smokeless tobacco: Not on file   Comment: Daily Caffeine Use -1  . Alcohol Use: No  . Drug Use: No  . Sexually Active:    Other Topics Concern  . Not on file   Social History Narrative  . No narrative on file    Current Outpatient Prescriptions on File Prior to Visit  Medication Sig Dispense Refill  . aspirin 81 MG tablet Take 81 mg by mouth daily.        Marland Kitchen atorvastatin (LIPITOR) 80 MG tablet Take 40 mg by mouth daily.        . celecoxib (CELEBREX) 200 MG capsule Take 200 mg by mouth daily.        . clidinium-chlordiazePOXIDE (LIBRAX) 2.5-5 MG per capsule Take 1 capsule by mouth 3 (three) times daily as needed. For pain  60 capsule  1  . esomeprazole (NEXIUM) 40 MG capsule Take 40 mg by mouth daily.        . FOLBIC  2.5-25-2 MG TABS TAKE 1 TABLET BY MOUTH EVERY DAY  90 tablet  2  . Glucose Blood (ASCENSIA CONTOUR TEST VI) 1 each by In Vitro route 4 (four) times daily.        . metFORMIN (GLUCOPHAGE-XR) 500 MG 24 hr tablet Take 1,000 mg by mouth 2 (two) times daily.        . metoCLOPramide (REGLAN) 5 MG tablet Take 5 mg by mouth 2 (two) times daily. Before breakfast and at bedtime       . Multiple Vitamin (MULTIVITAMIN) tablet Take 1 tablet by mouth daily.        . Multiple Vitamins-Minerals (VISION FORMULA PO) Take by mouth.        . triamcinolone (KENALOG) 0.1 % cream Apply topically 2 (two) times daily.  30 g  0  . valsartan-hydrochlorothiazide (DIOVAN-HCT) 320-12.5 MG per tablet Take 1 tablet by mouth daily.  90 tablet  3  . vitamin E 100 UNIT capsule Take by mouth daily.        . ondansetron (ZOFRAN) 4 MG tablet Take 1 tablet (  4 mg total) by mouth every 4 (four) hours as needed.  30 tablet  1  . traMADol (ULTRAM) 50 MG tablet Take 1 tablet (50 mg total) by mouth every 6 (six) hours as needed for pain.  20 tablet  0    No Known Allergies  Family History  Problem Relation Age of Onset  . Diabetes Mother   . Stroke Father   . Heart disease Sister   . Heart disease Brother     BP 138/78  Pulse 75  Temp(Src) 97.3 F (36.3 C) (Oral)  Ht 5\' 2"  (1.575 m)  Wt 174 lb (78.926 kg)  BMI 31.83 kg/m2  SpO2 97%  Review of Systems Denies brbpr and headache.      Objective:   Physical Exam GENERAL: no distress ABDOMEN: abdomen is soft, nontender.  no hepatosplenomegaly.   not distended.  no hernia Ext: trace bilat leg edema    (i reviewed bmet, cbc, and fe)  Assessment & Plan:  Abnormal cr--? Related to abd pain Hyponatremia, resolved htn is well-controlled on lower dosage of diovan-hct

## 2010-08-24 ENCOUNTER — Telehealth: Payer: Self-pay | Admitting: *Deleted

## 2010-08-24 ENCOUNTER — Encounter: Payer: Self-pay | Admitting: Internal Medicine

## 2010-08-24 DIAGNOSIS — R933 Abnormal findings on diagnostic imaging of other parts of digestive tract: Secondary | ICD-10-CM | POA: Insufficient documentation

## 2010-08-24 LAB — IBC PANEL: Iron: 45 ug/dL (ref 42–145)

## 2010-08-24 NOTE — Telephone Encounter (Signed)
Pt requesting Lab results.

## 2010-08-24 NOTE — Telephone Encounter (Signed)
Scheduled patient on 08/29/10 with 2:30 PM arrival for 3:30 PM procedure and pre visit on 08/25/10 at 1:00 PM.

## 2010-08-24 NOTE — Telephone Encounter (Signed)
Results are not available

## 2010-08-24 NOTE — Telephone Encounter (Signed)
Message copied by Daphine Deutscher on Wed Aug 24, 2010  8:20 AM ------      Message from: Stan Head E      Created: Wed Aug 24, 2010  7:37 AM       She needs an EGD ([facility]) this week or next week re weight loss and abnormal CT (see abd/pelvic CT)      Was supposed to do in Spring but  Did not            Thanks                        ----- Message -----         From: Minus Breeding, MD         Sent: 08/23/2010   2:54 PM           To: Iva Boop, MD            Baldo Ash            This pt has f/u appt to see you 09/29/10.  Based on the ct result, would she need another upper endoscopy?  Should she try to get in sooner?            sean

## 2010-08-24 NOTE — Telephone Encounter (Signed)
Spoke with patient and she wants to call me back today and schedule later today.

## 2010-08-25 ENCOUNTER — Ambulatory Visit (AMBULATORY_SURGERY_CENTER): Payer: Medicare Other | Admitting: *Deleted

## 2010-08-25 VITALS — Ht 63.0 in | Wt 174.0 lb

## 2010-08-25 DIAGNOSIS — R634 Abnormal weight loss: Secondary | ICD-10-CM

## 2010-08-25 DIAGNOSIS — R933 Abnormal findings on diagnostic imaging of other parts of digestive tract: Secondary | ICD-10-CM

## 2010-08-26 ENCOUNTER — Encounter: Payer: Self-pay | Admitting: Cardiology

## 2010-08-26 NOTE — Telephone Encounter (Signed)
Left message with spouse for pt to callback office

## 2010-08-26 NOTE — Telephone Encounter (Signed)
Unable to leave message for pt (line busy) per Addendum on 7/18 OV-results available on phone tree

## 2010-08-29 ENCOUNTER — Encounter: Payer: Self-pay | Admitting: Internal Medicine

## 2010-08-29 ENCOUNTER — Ambulatory Visit (AMBULATORY_SURGERY_CENTER): Payer: Medicare Other | Admitting: Internal Medicine

## 2010-08-29 VITALS — BP 160/84 | HR 85 | Temp 99.3°F | Resp 18 | Ht 62.0 in | Wt 172.0 lb

## 2010-08-29 DIAGNOSIS — D131 Benign neoplasm of stomach: Secondary | ICD-10-CM

## 2010-08-29 DIAGNOSIS — R109 Unspecified abdominal pain: Secondary | ICD-10-CM

## 2010-08-29 DIAGNOSIS — D378 Neoplasm of uncertain behavior of other specified digestive organs: Secondary | ICD-10-CM

## 2010-08-29 DIAGNOSIS — D371 Neoplasm of uncertain behavior of stomach: Secondary | ICD-10-CM

## 2010-08-29 DIAGNOSIS — D375 Neoplasm of uncertain behavior of rectum: Secondary | ICD-10-CM

## 2010-08-29 DIAGNOSIS — R634 Abnormal weight loss: Secondary | ICD-10-CM

## 2010-08-29 DIAGNOSIS — R933 Abnormal findings on diagnostic imaging of other parts of digestive tract: Secondary | ICD-10-CM

## 2010-08-29 MED ORDER — SODIUM CHLORIDE 0.9 % IV SOLN: 500.0000 mL | INTRAVENOUS | Status: DC

## 2010-08-29 NOTE — Patient Instructions (Addendum)
The stomach has some thickening of the lining or mucosa in association with polyps. I took biopsies and will let you know the results, likely by next week. We will call you from my office. Iva Boop, MD, [facility]  Please review all papers given to you by the recovery room nurse.  If you have any problems after discharge please call [phone]. One of our nurses will call you in the am to see how you are doing and answer any questions you may have.  Thank you

## 2010-08-30 ENCOUNTER — Telehealth: Payer: Self-pay | Admitting: *Deleted

## 2010-08-30 NOTE — Telephone Encounter (Signed)

## 2010-09-01 ENCOUNTER — Encounter: Payer: Self-pay | Admitting: Endocrinology

## 2010-09-01 ENCOUNTER — Ambulatory Visit (INDEPENDENT_AMBULATORY_CARE_PROVIDER_SITE_OTHER): Payer: Medicare Other | Admitting: Endocrinology

## 2010-09-01 DIAGNOSIS — Z0000 Encounter for general adult medical examination without abnormal findings: Secondary | ICD-10-CM | POA: Insufficient documentation

## 2010-09-01 NOTE — Progress Notes (Signed)
Subjective:    [name], female    DOB: January 19, 1941, 70 y.o.   MRN: [id number]  HPI Subjective:   Patient here for Medicare annual wellness visit and management of other chronic and acute problems.     Risk factors: advanced age    Roster of Physicians Providing Medical Care to Patient: Gi: gessner Opthal: digby Cardiol: hochrein Gyn: silva   Activities of Daily Living: In your present state of health, do you have any difficulty performing the following activities?:  Preparing food and eating?: No  Bathing yourself: No  Getting dressed: No  Using the toilet:No  Moving around from place to place: No  In the past year have you fallen or had a near fall?: No    Home Safety: Has smoke detector and wears seat belts. No firearms. No excess sun exposure.  Diet and Exercise  Current exercise habits:  Pt says good Dietary issues discussed: pt says diet is "ok"   Depression Screen  Q1: Over the past two weeks, have you felt down, depressed or hopeless? no  Q2: Over the past two weeks, have you felt little interest or pleasure in doing things? no   The following portions of the patient's history were reviewed and updated as appropriate: allergies, current medications, past family history, past medical history, past social history, past surgical history and problem list.  Past Medical History  Diagnosis Date  . Gastroparesis   . Iron deficiency anemia, unspecified   . Pure hypercholesterolemia   . Encounter for long-term (current) use of other medications   . Headache   . Unspecified essential hypertension   . Esophageal reflux   . Type II or unspecified type diabetes mellitus without mention of complication, not stated as uncontrolled   . Hematuria     w/u negative  . Arthritis     Spinal Osteoarthritis  . Diverticulosis   . Gastric polyp     Fundic Gland  . Anxiety   . Cataract   . Heart murmur   . Stroke     tia    Past Surgical History    Procedure Date  . Cholecystectomy 1993  . Tonsillectomy   . Colonoscopy 2002  . Esophagogastroduodenoscopy 08/29/2010    History   Social History  . Marital Status: Married    Spouse Name: N/A    Number of Children: N/A  . Years of Education: N/A   Occupational History  . Not on file.   Social History Main Topics  . Smoking status: Never Smoker   . Smokeless tobacco: Never Used   Comment: Daily Caffeine Use -1  . Alcohol Use: No  . Drug Use: No  . Sexually Active: Not on file   Other Topics Concern  . Not on file   Social History Narrative  . No narrative on file    Current Outpatient Prescriptions on File Prior to Visit  Medication Sig Dispense Refill  . aspirin 81 MG tablet Take 81 mg by mouth daily.        Marland Kitchen atorvastatin (LIPITOR) 80 MG tablet Take 40 mg by mouth daily.        . clidinium-chlordiazePOXIDE (LIBRAX) 2.5-5 MG per capsule Take 1 capsule by mouth 3 (three) times daily as needed. For pain  60 capsule  1  . esomeprazole (NEXIUM) 40 MG capsule Take 40 mg by mouth daily.        . Ferrous Sulfate (IRON) 28 MG TABS Take by mouth daily.        Marland Kitchen  FOLBIC 2.5-25-2 MG TABS TAKE 1 TABLET BY MOUTH EVERY DAY  90 tablet  2  . Glucose Blood (ASCENSIA CONTOUR TEST VI) 1 each by In Vitro route 4 (four) times daily.        . metFORMIN (GLUCOPHAGE-XR) 500 MG 24 hr tablet Take 1,000 mg by mouth 2 (two) times daily.        . metoCLOPramide (REGLAN) 5 MG tablet Take 5 mg by mouth 2 (two) times daily. Before breakfast and at bedtime       . Multiple Vitamin (MULTIVITAMIN) tablet Take 1 tablet by mouth daily.        . Multiple Vitamins-Minerals (VISION FORMULA PO) Take by mouth.        . valsartan-hydrochlorothiazide (DIOVAN-HCT) 320-12.5 MG per tablet Take 1 tablet by mouth daily.  90 tablet  3  . vitamin E 100 UNIT capsule Take by mouth daily.        . ondansetron (ZOFRAN) 4 MG tablet Take 1 tablet (4 mg total) by mouth every 4 (four) hours as needed.  30 tablet  1  .  traMADol (ULTRAM) 50 MG tablet Take 1 tablet (50 mg total) by mouth every 6 (six) hours as needed for pain.  20 tablet  0   Current Facility-Administered Medications on File Prior to Visit  Medication Dose Route Frequency Provider Last Rate Last Dose  . 0.9 %  sodium chloride infusion  500 mL Intravenous Continuous Iva Boop, MD      . 0.9 %  sodium chloride infusion  500 mL Intravenous Continuous Iva Boop, MD        No Known Allergies  Family History  Problem Relation Age of Onset  . Diabetes Mother   . Stroke Father   . Heart disease Sister   . Heart disease Brother     BP 118/74  Pulse 75  Temp(Src) 97.8 F (36.6 C) (Oral)  Ht 5\' 2"  (1.575 m)  Wt 172 lb 6.4 oz (78.2 kg)  BMI 31.53 kg/m2  SpO2 96%   Review of Systems  Denies hearing loss, and visual loss Objective:   Vision:  Sees opthalmologist Hearing: grossly normal Body mass index:  See vs page Msk: pt easily and quickly performs "get-up-and-go" from a sitting position Cognitive Impairment Assessment: cognition, memory and judgment appear normal.  remembers 3/3 at 5 minutes.  excellent recall.  can easily read and write a sentence.  alert and oriented x 3   Assessment:   Medicare wellness utd on preventive parameters    Plan:   During the course of the visit the patient was educated and counseled about appropriate screening and preventive services including:        Fall prevention   Screening mammography--pt gets at gyn dr. Alton [place] densitometry screening  Diabetes screening   Nutrition counseling  Vaccines / LABS Zostavax is advised  Patient Instructions (the written plan) was given to the patient.         Review of Systems     Objective:   Physical Exam        Assessment & Plan:

## 2010-09-01 NOTE — Patient Instructions (Addendum)
please consider these measures for your health:  minimize alcohol.  do not use tobacco products.  have a colonoscopy at least every 10 years from age 69.  Women should have an annual mammogram from age 25.  keep firearms safely stored.  always use seat belts.  have working smoke alarms in your home.  see an eye doctor and dentist regularly.  never drive under the influence of alcohol or drugs (including prescription drugs).  those with fair skin should take precautions against the sun.  Let me know if you wish to have a "bone-density" test, to check for osteoporosis. please let me know what your wishes would be, if artificial life support measures should become necessary.  it is critically important to prevent falling down (keep floor areas well-lit, dry, and free of loose objects) You should have a vaccine against shingles (a painful rash which results from the  chickenpox infection which most people had many years ago).  This vaccine reduces, but does not totally eliminate the risk of shingles.  Because this is a medicare part d benefit, there are 3 ways yo can get it:  You can go to a pharmacy and get the injection (I can give you a prescription), or I can give you a prescription to have filled at a pharmacy, and bring back here for [place] to give.  The other option is that you can pay up-front for it, and we'll give you a form to make a claim for reimbursement from your medicare part d carrier. (update; we discussed code status.  pt requests full code, but would not want to be started or maintained on artificial life-support measures if there was not a reasonable chance of recovery)

## 2010-09-02 ENCOUNTER — Emergency Department (HOSPITAL_COMMUNITY)
Admission: EM | Admit: 2010-09-02 | Discharge: 2010-09-02 | Disposition: A | Discharge status: Home / Self Care | Payer: Medicare Other | Attending: Emergency Medicine | Admitting: Emergency Medicine

## 2010-09-02 ENCOUNTER — Emergency Department (HOSPITAL_COMMUNITY): Payer: Medicare Other

## 2010-09-02 ENCOUNTER — Telehealth: Payer: Self-pay | Admitting: Cardiology

## 2010-09-02 DIAGNOSIS — Z79899 Other long term (current) drug therapy: Secondary | ICD-10-CM | POA: Insufficient documentation

## 2010-09-02 DIAGNOSIS — R079 Chest pain, unspecified: Secondary | ICD-10-CM | POA: Insufficient documentation

## 2010-09-02 DIAGNOSIS — R6883 Chills (without fever): Secondary | ICD-10-CM | POA: Insufficient documentation

## 2010-09-02 DIAGNOSIS — K219 Gastro-esophageal reflux disease without esophagitis: Secondary | ICD-10-CM | POA: Insufficient documentation

## 2010-09-02 DIAGNOSIS — R0989 Other specified symptoms and signs involving the circulatory and respiratory systems: Secondary | ICD-10-CM | POA: Insufficient documentation

## 2010-09-02 DIAGNOSIS — R61 Generalized hyperhidrosis: Secondary | ICD-10-CM | POA: Insufficient documentation

## 2010-09-02 DIAGNOSIS — E119 Type 2 diabetes mellitus without complications: Secondary | ICD-10-CM | POA: Insufficient documentation

## 2010-09-02 DIAGNOSIS — I498 Other specified cardiac arrhythmias: Secondary | ICD-10-CM | POA: Insufficient documentation

## 2010-09-02 DIAGNOSIS — R0609 Other forms of dyspnea: Secondary | ICD-10-CM | POA: Insufficient documentation

## 2010-09-02 DIAGNOSIS — I10 Essential (primary) hypertension: Secondary | ICD-10-CM | POA: Insufficient documentation

## 2010-09-02 DIAGNOSIS — Z7982 Long term (current) use of aspirin: Secondary | ICD-10-CM | POA: Insufficient documentation

## 2010-09-02 DIAGNOSIS — E785 Hyperlipidemia, unspecified: Secondary | ICD-10-CM | POA: Insufficient documentation

## 2010-09-02 DIAGNOSIS — R002 Palpitations: Secondary | ICD-10-CM | POA: Insufficient documentation

## 2010-09-02 LAB — DIFFERENTIAL
Basophils Absolute: 0 10*3/uL (ref 0.0–0.1)
Lymphocytes Relative: 24 % (ref 12–46)
Lymphs Abs: 2.1 10*3/uL (ref 0.7–4.0)
Monocytes Absolute: 0.9 10*3/uL (ref 0.1–1.0)
Neutro Abs: 5.7 10*3/uL (ref 1.7–7.7)

## 2010-09-02 LAB — CK TOTAL AND CKMB (NOT AT ARMC)
CK, MB: 5.1 ng/mL — ABNORMAL HIGH (ref 0.3–4.0)
CK, MB: 4.8 ng/mL — ABNORMAL HIGH (ref 0.3–4.0)
Relative Index: 3.1 — ABNORMAL HIGH (ref 0.0–2.5)
Total CK: 148 U/L (ref 7–177)

## 2010-09-02 LAB — CBC
HCT: 33.6 % — ABNORMAL LOW (ref 36.0–46.0)
Hemoglobin: 11.5 g/dL — ABNORMAL LOW (ref 12.0–15.0)
MCV: 77.4 fL — ABNORMAL LOW (ref 78.0–100.0)
WBC: 8.8 10*3/uL (ref 4.0–10.5)

## 2010-09-02 LAB — TROPONIN I: Troponin I: <0.3 ng/mL (ref <0.30)

## 2010-09-02 LAB — BASIC METABOLIC PANEL
Chloride: 95 mEq/L — ABNORMAL LOW (ref 96–112)
GFR calc Af Amer: >60 mL/min (ref >60)
GFR calc non Af Amer: >60 mL/min (ref >60)
Potassium: 3.6 mEq/L (ref 3.5–5.1)
Sodium: 133 mEq/L — ABNORMAL LOW (ref 135–145)

## 2010-09-02 LAB — GLUCOSE, CAPILLARY: Glucose-Capillary: 108 mg/dL — ABNORMAL HIGH (ref 70–99)

## 2010-09-02 NOTE — Telephone Encounter (Signed)
Per pt sister call, pt is having very irregular HR and high BP. Pt is being transported to the emergency room. Pt sister wants to know if Dr. Gala Romney can come see her in ER.

## 2010-09-02 NOTE — Telephone Encounter (Signed)
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

## 2010-09-05 ENCOUNTER — Telehealth: Payer: Self-pay | Admitting: Cardiology

## 2010-09-05 NOTE — Telephone Encounter (Signed)
Pt states she was in the ED and was instructed to see Dr Antoine Poche as soon as possible.  i explained to pt that he is not in the office for the next two weeks.  She was given an appointment for Friday with Lilian Coma.  Pt instructed to call back before if necessary.

## 2010-09-05 NOTE — Telephone Encounter (Signed)
Per pt call, pt was in ER for irregular heart beat last Friday July 27, and pt was instructed to see Dr. Antoine Poche ASAP. Pt has appt with Dr. Antoine Poche on Aug 23rd but wants to see MD sooner following ER visit. Pt was informed that Dr. Antoine Poche didn't have any availability for a sooner appt and offered to schedule pt appt with Tereso Newcomer. Pt insisted on contacting Dr. Jenene Slicker nurse to figure out a time pt can see MD. Please return call to pt to discuss/advise.

## 2010-09-06 ENCOUNTER — Ambulatory Visit
Admission: RE | Admit: 2010-09-06 | Discharge: 2010-09-06 | Disposition: A | Discharge status: Home / Self Care | Payer: Medicare Other | Source: Ambulatory Visit | Attending: Interventional Radiology | Admitting: Interventional Radiology

## 2010-09-06 VITALS — BP 125/67 | HR 78 | Temp 97.9°F | Resp 16 | Ht 62.0 in | Wt 170.0 lb

## 2010-09-06 DIAGNOSIS — I83819 Varicose veins of unspecified lower extremities with pain: Secondary | ICD-10-CM

## 2010-09-07 ENCOUNTER — Telehealth: Payer: Self-pay | Admitting: Internal Medicine

## 2010-09-07 NOTE — Telephone Encounter (Signed)
Called the pathology department  to see why 08/29/10 pathology report hasn't come back yet. It's waiting to be signed off by Dr. Gerre Pebbles. Patient was informed and told that we will let her know when we get results and Dr. Leone Payor review them. Patient question is there anything she need to do different or any new medications because she is still having some discomfort.

## 2010-09-07 NOTE — Progress Notes (Signed)
C/o bilateral leg discomfort, especially Left popliteal.      Bilateral LE telangiectases noted.  No visible varicosities palpated or noted.    Bilateral LE edema, Left greater than Right.    Pt has not been wearing support hose or graduated compression garment.

## 2010-09-09 ENCOUNTER — Encounter: Payer: Self-pay | Admitting: Physician Assistant

## 2010-09-09 ENCOUNTER — Ambulatory Visit (INDEPENDENT_AMBULATORY_CARE_PROVIDER_SITE_OTHER): Payer: Medicare Other | Admitting: Physician Assistant

## 2010-09-09 VITALS — BP 138/70 | HR 76 | Resp 18 | Ht 62.0 in | Wt 171.0 lb

## 2010-09-09 DIAGNOSIS — R011 Cardiac murmur, unspecified: Secondary | ICD-10-CM

## 2010-09-09 DIAGNOSIS — R079 Chest pain, unspecified: Secondary | ICD-10-CM

## 2010-09-09 DIAGNOSIS — E7800 Pure hypercholesterolemia, unspecified: Secondary | ICD-10-CM

## 2010-09-09 DIAGNOSIS — I10 Essential (primary) hypertension: Secondary | ICD-10-CM

## 2010-09-09 DIAGNOSIS — I498 Other specified cardiac arrhythmias: Secondary | ICD-10-CM

## 2010-09-09 DIAGNOSIS — R000 Tachycardia, unspecified: Secondary | ICD-10-CM

## 2010-09-09 MED ORDER — METOPROLOL TARTRATE 25 MG PO TABS: 25.0000 mg | ORAL_TABLET | Freq: Two times a day (BID) | ORAL | Status: DC

## 2010-09-09 NOTE — Assessment & Plan Note (Signed)
She has typical and atypical features.  Proceed with Lexiscan Myoview.  Of note, she cannot walk on a treadmill due to back pain.

## 2010-09-09 NOTE — Progress Notes (Signed)
History of Present Illness: Primary Cardiologist:  Dr. 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 female presents for follow up from the ED.  She has a history of diabetes, hypertension, hyperlipidemia and GERD.  She recently had an EGD with gastric polyps.  She is awaiting biopsy results.  She does have a history of SVT.  She was treated with adenosine in the emergency room in 2011.  She has a history of heart murmur.  Echocardiogram 2/11: EF 60-65%, mild LAE, grade 1 diastolic dysfunction, aortic valve sclerosis, mean gradient 9 mm of mercury, PASP 34.  Myoview 1/11: EF 82%, no ischemia.  She presented to the emergency room via EMS on 7/28.  She developed tachycardia palpitations and an EKG en route demonstrated SVT with heart rates from 130 to 150.  I reviewed the ECG available in her chart.  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  By her description, she received adenosine x1.  This broke her rhythm.  Her EKG in the emergency room demonstrated normal sinus rhythm.  Troponins were negative x 3.  Labs: Na 133, K 3.6, creat 0.57, Hgb 11.5.  CXR was non-acute.  This was her second significant episode of SVT.  She occasionally has palpitations that last just a few seconds.  She has not had to perform Valsalva maneuvers.  She has occasional chest pain.  She is the primary caregiver for her husband who is ill.  She notes occasional chest pressure.  However, she denies exertional chest pain.  She does feel somewhat short of breath at times.  She denies orthopnea, PND or significant edema.  She occasionally gets lightheaded.  She denies syncope.  Past Medical History  Diagnosis Date  . Gastroparesis   . Iron deficiency anemia, unspecified   . Pure hypercholesterolemia   . Headache   . Unspecified essential hypertension   . Esophageal reflux   . Type II or unspecified type diabetes mellitus without mention of complication, not stated as  uncontrolled   . Hematuria     w/u negative  . Arthritis     Spinal Osteoarthritis  . Diverticulosis   . Gastric polyp     Fundic Gland  . Anxiety   . Cataract   . Heart murmur     Echocardiogram 2/11: EF 60-65%, mild LAE, grade 1 diastolic dysfunction, aortic valve sclerosis, mean gradient 9 mm of mercury, PASP 34  . Stroke     tia  . Chest pain     Myoview 1/11: EF 82%, no ischemia.  Marland Kitchen PSVT (paroxysmal supraventricular tachycardia)     Current Outpatient Prescriptions  Medication Sig Dispense Refill  . aspirin 81 MG tablet Take 81 mg by mouth daily.        Marland Kitchen atorvastatin (LIPITOR) 80 MG tablet Take 40 mg by mouth daily.        . clidinium-chlordiazePOXIDE (LIBRAX) 2.5-5 MG per capsule Take 1 capsule by mouth 3 (three) times daily as needed. For pain  60 capsule  1  . esomeprazole (NEXIUM) 40 MG capsule Take 40 mg by mouth daily.        . Ferrous Sulfate (IRON) 28 MG TABS Take by mouth daily.        . FOLBIC 2.5-25-2 MG TABS TAKE 1 TABLET BY MOUTH EVERY DAY  90 tablet  2  . Glucose Blood (ASCENSIA CONTOUR TEST VI) 1 each by In Vitro route 4 (four) times daily.        Marland Kitchen  metFORMIN (GLUCOPHAGE-XR) 500 MG 24 hr tablet Take 1,000 mg by mouth 2 (two) times daily.        . metoCLOPramide (REGLAN) 5 MG tablet Take 5 mg by mouth 2 (two) times daily. Before breakfast and at bedtime       . Multiple Vitamin (MULTIVITAMIN) tablet Take 1 tablet by mouth daily.        . Multiple Vitamins-Minerals (VISION FORMULA PO) Take by mouth.        . ondansetron (ZOFRAN) 4 MG tablet Take 1 tablet (4 mg total) by mouth every 4 (four) hours as needed.  30 tablet  1  . traMADol (ULTRAM) 50 MG tablet Take 1 tablet (50 mg total) by mouth every 6 (six) hours as needed for pain.  20 tablet  0  . valsartan-hydrochlorothiazide (DIOVAN-HCT) 320-12.5 MG per tablet Take 1 tablet by mouth daily.  90 tablet  3  . vitamin E 100 UNIT capsule Take by mouth daily.        . metoprolol tartrate (LOPRESSOR) 25 MG tablet Take  1 tablet (25 mg total) by mouth 2 (two) times daily.  60 tablet  11   Current Facility-Administered Medications  Medication Dose Route Frequency Provider Last Rate Last Dose  . DISCONTD: 0.9 %  sodium chloride infusion  500 mL Intravenous Continuous Iva Boop, MD      . DISCONTD: 0.9 %  sodium chloride infusion  500 mL Intravenous Continuous Iva Boop, MD        Allergies: No Known Allergies  Social history:  Nonsmoker  Family history:  Significant for CAD  ROS:  Please see the history of present illness.  All other systems reviewed and negative.   Vital Signs: BP 138/70  Pulse 76  Resp 18  Ht 5\' 2"  (1.575 m)  Wt 171 lb (77.565 kg)  BMI 31.28 kg/m2  PHYSICAL EXAM: Well nourished, well developed, in no acute distress HEENT: normal Neck: no JVD Vascular: No carotid bruits Endocrine: No thyromegaly Cardiac:  normal S1, S2; RRR; 1-2/6 systolic murmur At the right upper sternal border Lungs:  clear to auscultation bilaterally, no wheezing, rhonchi or rales Abd: soft, nontender, no hepatomegaly Ext: no edema Skin: warm and dry Neuro:  CNs 2-12 intact, no focal abnormalities noted Psych: Normal affect  EKG:  Sinus rhythm, heart rate 76, normal axis, poor R-wave progression, no ischemic changes  ASSESSMENT AND PLAN:

## 2010-09-09 NOTE — Assessment & Plan Note (Signed)
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  Her aortic valve gradient was 9 mm of mercury.  She feels as though she has been able to hear her heartbeat recently.  She does not have carotid bruits on exam.  Repeat echocardiogram.

## 2010-09-09 NOTE — Assessment & Plan Note (Signed)
Controlled.  Continue current therapy.  

## 2010-09-09 NOTE — Patient Instructions (Addendum)
Your physician recommends that you schedule a follow-up appointment in: Keep scheduled appointment with Dr. Antoine Poche.  You have been referred to Electrophysiology.  Your physician has requested that you have a lexiscan myoview. For further information please visit [email or web address] Please follow instruction sheet, as given.  Your physician has requested that you have an echocardiogram. Echocardiography is a painless test that uses sound waves to create images of your heart. It provides your doctor with information about the size and shape of your heart and how well your heart's chambers and valves are working. This procedure takes approximately one hour. There are no restrictions for this procedure.    Your physician has recommended you make the following change in your medication: Start metoprolol tartrate 25 mg by mouth twice daily.

## 2010-09-09 NOTE — Assessment & Plan Note (Signed)
This is her second recurrence requiring a visit to the emergency room and administration of adenosine.  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  She does not want these to recur.  I will place her on metoprolol tartrate 25 mg twice a day.  I will check a TSH.  I will refer her to electrophysiology to discuss the possibility of radiofrequency catheter ablation.  She can keep her followup with Dr. Antoine Poche later this month as previously scheduled.

## 2010-09-09 NOTE — Assessment & Plan Note (Signed)
Managed by PCP

## 2010-09-12 ENCOUNTER — Telehealth: Payer: Self-pay

## 2010-09-12 NOTE — Telephone Encounter (Signed)
Message copied by Michele Mcalpine on Mon Sep 12, 2010  2:41 PM ------      Message from: Stan Head E      Created: Mon Sep 12, 2010  1:42 PM       1) Office - I explained results to her and that she needs a repeat EGD with biopsies - she can do this 0900 8/8 so please arrange for her to do so by calling her today and making the appt      2) [facility] - no recall and no letter

## 2010-09-12 NOTE — Progress Notes (Signed)
Quick Note:  1) Office - I explained results to her and that she needs a repeat EGD with biopsies - she can do this 0900 8/8 so please arrange for her to do so by calling her today and making the appt 2) [facility] - no recall and no letter ______

## 2010-09-12 NOTE — Telephone Encounter (Signed)
Pt scheduled in the [facility] for 09/15/10@9 :30am. Pt aware of appt date and time and knows to arrive at 8:30am. Pt instructed on prep for EGD, pt knows to follow the same instructions as last procedure.

## 2010-09-14 ENCOUNTER — Ambulatory Visit (INDEPENDENT_AMBULATORY_CARE_PROVIDER_SITE_OTHER): Payer: Medicare Other | Admitting: Internal Medicine

## 2010-09-14 ENCOUNTER — Encounter: Payer: Self-pay | Admitting: Internal Medicine

## 2010-09-14 VITALS — BP 112/62 | HR 74 | Temp 98.3°F | Ht 62.0 in | Wt 173.0 lb

## 2010-09-14 DIAGNOSIS — M5431 Sciatica, right side: Secondary | ICD-10-CM

## 2010-09-14 DIAGNOSIS — M5430 Sciatica, unspecified side: Secondary | ICD-10-CM

## 2010-09-14 MED ORDER — METHOCARBAMOL 500 MG PO TABS: 500.0000 mg | ORAL_TABLET | Freq: Three times a day (TID) | ORAL | Status: AC | PRN

## 2010-09-14 MED ORDER — METHYLPREDNISOLONE ACETATE 80 MG/ML IJ SUSP
80.0000 mg | Freq: Once | INTRAMUSCULAR | Status: AC
  Administered 2010-09-14: 80 mg via INTRAMUSCULAR

## 2010-09-14 NOTE — Patient Instructions (Signed)
It was good to see you today. Your leg pain is called "sciatica" and is coming from your back Medrol (steroid) shot given today for the inflammation and pain Also use tramadol or robaxin to help with pain if needed - Your prescription(s) have been submitted to your pharmacy. Please take as directed and contact our office if you believe you are having problem(s) with the medication(s). we'll make referral to physical therapy. Our office will contact you regarding appointment(s) once made.

## 2010-09-14 NOTE — Progress Notes (Signed)
Subjective:    [name], female    DOB: 1940/09/18, 70 y.o.   MRN: [id number]  HPI  complains of RLE pain Radiates from buttock to thigh and into calf Onset pain 3 days ago Sudden onset, unchanged in intensity since that time symptoms worst with sitting or lying on right side - better to stand or walk Denies lifting or precipitating injury Hx same 2007 with "bulging discs" - requests PT as this helped her previously  Past Medical History  Diagnosis Date  . Gastroparesis   . Iron deficiency anemia, unspecified   . Pure hypercholesterolemia   . Headache   . Unspecified essential hypertension   . Esophageal reflux   . Type II or unspecified type diabetes mellitus without mention of complication, not stated as uncontrolled   . Arthritis     Spinal Osteoarthritis  . Diverticulosis   . Gastric polyp     Fundic Gland  . Anxiety   . Cataract   . Heart murmur     Echocardiogram 2/11: EF 60-65%, mild LAE, grade 1 diastolic dysfunction, aortic valve sclerosis, mean gradient 9 mm of mercury, PASP 34  . Stroke     tia  . Chest pain     Myoview 1/11: EF 82%, no ischemia.  Marland Kitchen PSVT (paroxysmal supraventricular tachycardia)     Review of Systems  Constitutional: Negative for fever.  Musculoskeletal: Negative for back pain and joint swelling.       Objective:   Physical Exam BP 112/62  Pulse 74  Temp(Src) 98.3 F (36.8 C) (Oral)  Ht 5\' 2"  (1.575 m)  Wt 173 lb (78.472 kg)  BMI 31.64 kg/m2  SpO2 96%  Constitutional: She appears well-developed and well-nourished. Moderately uncomfortable but no distress. Cardiovascular: Normal rate, regular rhythm and normal heart sounds.  No murmur heard. No BLE edema. Pulmonary/Chest: Effort normal and breath sounds normal. No respiratory distress. She has no wheezes.  Musculoskeletal: Back: full range of motion of thoracic and lumbar spine. Non tender to palpation. Positive R straight leg raise. DTR's are symmetrically  intact. Sensation min diminished in L4 dermatomes of the lRLE. Full strength to manual muscle testing- able to heel toe walk without difficulty and ambulates with a antalgic gait. Neurological: She is alert and oriented to person, place, and time. No cranial nerve deficit. Coordination normal.  Skin: Skin is warm and dry. No rash noted. No erythema.  Psychiatric: She has a normal mood and affect. Her behavior is normal. Judgment and thought content normal.   Lab Results  Component Value Date   WBC 8.8 09/02/2010   HGB 11.5* 09/02/2010   HCT 33.6* 09/02/2010   PLT 225 09/02/2010   CHOL 150 03/08/2010   TRIG 102.0 03/08/2010   HDL 57.40 03/08/2010   LDLDIRECT 195.3 05/11/2006   ALT 23 03/08/2010   AST 27 03/08/2010   NA 133* 09/02/2010   K 3.6 09/02/2010   CL 95* 09/02/2010   CREATININE 0.57 09/02/2010   BUN 15 09/02/2010   CO2 24 09/02/2010   TSH 0.40 09/09/2010   HGBA1C 6.6* 06/09/2010   MICROALBUR 0.3 06/09/2010       Assessment & Plan:  r sciatica, suspect disc bulge vs "rupture" - hx same 2007 (MRI L spine reviewed) -  offered pred pak, pt declines due to EGD plans in AM - therefore IM medrol given for antiinflam relief -  also muscle relaxer and tramadol as needed and refer to PT (helped in 2007) If unimproved  or worse, pt will follow up to consider repeat imaging - but declines due to other plans for ongoing GI and cards procedures at this time

## 2010-09-15 ENCOUNTER — Ambulatory Visit (AMBULATORY_SURGERY_CENTER): Payer: Medicare Other | Admitting: Internal Medicine

## 2010-09-15 ENCOUNTER — Encounter: Payer: Self-pay | Admitting: Internal Medicine

## 2010-09-15 VITALS — BP 173/97 | HR 77 | Temp 98.8°F | Resp 18 | Ht 62.0 in | Wt 170.0 lb

## 2010-09-15 DIAGNOSIS — R933 Abnormal findings on diagnostic imaging of other parts of digestive tract: Secondary | ICD-10-CM

## 2010-09-15 DIAGNOSIS — D3A8 Other benign neuroendocrine tumors: Secondary | ICD-10-CM | POA: Insufficient documentation

## 2010-09-15 DIAGNOSIS — R634 Abnormal weight loss: Secondary | ICD-10-CM

## 2010-09-15 DIAGNOSIS — D131 Benign neoplasm of stomach: Secondary | ICD-10-CM

## 2010-09-15 DIAGNOSIS — D3A00 Benign carcinoid tumor of unspecified site: Secondary | ICD-10-CM

## 2010-09-15 LAB — GLUCOSE, CAPILLARY: Glucose-Capillary: 117 mg/dL — ABNORMAL HIGH (ref 70–99)

## 2010-09-15 MED ORDER — SODIUM CHLORIDE 0.9 % IV SOLN: 500.0000 mL | INTRAVENOUS | Status: DC

## 2010-09-15 NOTE — Patient Instructions (Addendum)
The endoscopy showed multiple polyps again. I am trying to sort out if the neuroendocrine tumor was a small tumor that was removed or more diffuse in the stomach. Will call you when the results return. Should be by early next week (I am out of town next week but will try to check on it and notify you).   YOU WILL RECEIVE A CALL OR LETTER FROM DR. Laurens Matheny AS TO RESULTS OF PATHOLOGY ON BIOPSIES TAKEN TODAY.  FOLLOW DISCHARGE INSTRUCTIONS GIVEN TO YOU (BLUE & GREEN SHEETS)

## 2010-09-16 ENCOUNTER — Ambulatory Visit (HOSPITAL_COMMUNITY): Payer: Medicare Other | Attending: Endocrinology | Admitting: Radiology

## 2010-09-16 ENCOUNTER — Telehealth: Payer: Self-pay

## 2010-09-16 ENCOUNTER — Telehealth: Payer: Self-pay | Admitting: *Deleted

## 2010-09-16 ENCOUNTER — Other Ambulatory Visit (HOSPITAL_COMMUNITY): Payer: Medicare Other | Admitting: Radiology

## 2010-09-16 DIAGNOSIS — I471 Supraventricular tachycardia, unspecified: Secondary | ICD-10-CM | POA: Insufficient documentation

## 2010-09-16 DIAGNOSIS — R011 Cardiac murmur, unspecified: Secondary | ICD-10-CM | POA: Insufficient documentation

## 2010-09-16 DIAGNOSIS — E669 Obesity, unspecified: Secondary | ICD-10-CM | POA: Insufficient documentation

## 2010-09-16 DIAGNOSIS — Z8673 Personal history of transient ischemic attack (TIA), and cerebral infarction without residual deficits: Secondary | ICD-10-CM | POA: Insufficient documentation

## 2010-09-16 DIAGNOSIS — I079 Rheumatic tricuspid valve disease, unspecified: Secondary | ICD-10-CM | POA: Insufficient documentation

## 2010-09-16 DIAGNOSIS — I10 Essential (primary) hypertension: Secondary | ICD-10-CM | POA: Insufficient documentation

## 2010-09-16 DIAGNOSIS — I059 Rheumatic mitral valve disease, unspecified: Secondary | ICD-10-CM | POA: Insufficient documentation

## 2010-09-16 DIAGNOSIS — E119 Type 2 diabetes mellitus without complications: Secondary | ICD-10-CM | POA: Insufficient documentation

## 2010-09-16 DIAGNOSIS — C269 Malignant neoplasm of ill-defined sites within the digestive system: Secondary | ICD-10-CM | POA: Insufficient documentation

## 2010-09-16 DIAGNOSIS — E785 Hyperlipidemia, unspecified: Secondary | ICD-10-CM | POA: Insufficient documentation

## 2010-09-16 DIAGNOSIS — I0989 Other specified rheumatic heart diseases: Secondary | ICD-10-CM | POA: Insufficient documentation

## 2010-09-16 NOTE — Telephone Encounter (Signed)
Left message on machine for pt to return my call  

## 2010-09-16 NOTE — Telephone Encounter (Signed)
i am unaware of any cancer. If sxs persist, we can do mri of the spine

## 2010-09-16 NOTE — Telephone Encounter (Signed)
Pt called stating she has not had any improvement in pain since OV with VAL 08/08, pt also states she has been diagnosed with "some kind of cancer: per GI procedure visit 08/09. Pt is requesting advisement from MD on both issues.

## 2010-09-16 NOTE — Telephone Encounter (Signed)
Called and pt not home. Was told to call back later by her husband.

## 2010-09-16 NOTE — Telephone Encounter (Signed)
Message copied by Michele Mcalpine on Fri Sep 16, 2010  3:53 PM ------      Message from: Iva Boop      Created: Fri Sep 16, 2010  3:31 PM       1) No recall or letter      2) Call her from office and explain no tumor in any of these biopsies - good news -             I need to review this with pathologist when I return from vacation and will then contact her again about any next possible tests or MD visits

## 2010-09-16 NOTE — Progress Notes (Signed)
Quick Note:  1) No recall or letter 2) Call her from office and explain no tumor in any of these biopsies - good news -   I need to review this with pathologist when I return from vacation and will then contact her again about any next possible tests or MD visits ______

## 2010-09-16 NOTE — Telephone Encounter (Signed)

## 2010-09-17 ENCOUNTER — Encounter: Payer: Self-pay | Admitting: Internal Medicine

## 2010-09-17 NOTE — Telephone Encounter (Signed)
This encounter was created in error - please disregard.

## 2010-09-19 ENCOUNTER — Telehealth: Payer: Self-pay | Admitting: Internal Medicine

## 2010-09-19 ENCOUNTER — Telehealth: Payer: Self-pay | Admitting: *Deleted

## 2010-09-19 NOTE — Telephone Encounter (Signed)
Spoke with pt and she is aware.

## 2010-09-19 NOTE — Telephone Encounter (Signed)
Left message on machine for pt to return my call  

## 2010-09-19 NOTE — Telephone Encounter (Signed)
Pt aware of echo results today. Danielle Rankin

## 2010-09-19 NOTE — Telephone Encounter (Signed)
Questions answered about path report she and her husband aware that we will contact her once Dr Leone Payor can further rewview.

## 2010-09-19 NOTE — Telephone Encounter (Signed)
Phone is busy I will continue to try and reach the patient  

## 2010-09-20 ENCOUNTER — Other Ambulatory Visit (HOSPITAL_COMMUNITY): Payer: Medicare Other | Admitting: Radiology

## 2010-09-20 NOTE — Telephone Encounter (Signed)
Spoke with pt and was advised that she was contacted by [facility] and told that tumor found is not cancerous. Pt also advised that PT referral is awaiting approval from Medicare and once received appt will be scheduled.

## 2010-09-22 ENCOUNTER — Telehealth: Payer: Self-pay | Admitting: Internal Medicine

## 2010-09-22 ENCOUNTER — Other Ambulatory Visit (HOSPITAL_COMMUNITY): Payer: Medicare Other | Admitting: Radiology

## 2010-09-22 NOTE — Telephone Encounter (Signed)
I explained to the patient's sister that Dr Leone Payor needs to speak with the pathologist prior to seeing her or calling her.  She is to keep the appt for 09/30/10 unless Dr Leone Payor calls her next week with an alternative plan.

## 2010-09-23 ENCOUNTER — Telehealth: Payer: Self-pay | Admitting: Cardiology

## 2010-09-23 NOTE — Telephone Encounter (Signed)
Pt was given Metoprolol by Lorin Picket and her leg has been hurting ever since.  Muscle pain, numbness.  Could this be a side effect of this medication?  Please call patient.

## 2010-09-23 NOTE — Telephone Encounter (Signed)
Attempted to call pt twice now and the line is busy.  Will continue to try to contact her.

## 2010-09-26 ENCOUNTER — Telehealth: Payer: Self-pay | Admitting: Gastroenterology

## 2010-09-26 ENCOUNTER — Telehealth: Payer: Self-pay | Admitting: Internal Medicine

## 2010-09-26 NOTE — Telephone Encounter (Signed)
Spoke with pt who states she stopped the metoprolol for 2 days but didn't see any improvement of muscle aches. She has since re-started it.  She will follow up with Dr Antoine Poche as scheduled.

## 2010-09-26 NOTE — Telephone Encounter (Signed)
Pt's family called.  I explained biopsy result (no cancer, no carcinoid).  They have questions about plan from here which I could not answer, I will pass this on to Dr. Leone Payor.

## 2010-09-27 NOTE — Telephone Encounter (Signed)
I called her and re-explained that I need to review with pathologists and will discuss more at 8/23 REV

## 2010-09-28 NOTE — Telephone Encounter (Signed)
The patient has an appointment 09/29/10 Dr. Leone Payor will discuss results at that time.

## 2010-09-29 ENCOUNTER — Ambulatory Visit (INDEPENDENT_AMBULATORY_CARE_PROVIDER_SITE_OTHER): Payer: Medicare Other | Admitting: Internal Medicine

## 2010-09-29 ENCOUNTER — Ambulatory Visit (INDEPENDENT_AMBULATORY_CARE_PROVIDER_SITE_OTHER): Payer: Medicare Other | Admitting: Cardiology

## 2010-09-29 ENCOUNTER — Encounter: Payer: Self-pay | Admitting: Cardiology

## 2010-09-29 ENCOUNTER — Encounter: Payer: Self-pay | Admitting: Internal Medicine

## 2010-09-29 ENCOUNTER — Other Ambulatory Visit (INDEPENDENT_AMBULATORY_CARE_PROVIDER_SITE_OTHER): Payer: Medicare Other

## 2010-09-29 DIAGNOSIS — D509 Iron deficiency anemia, unspecified: Secondary | ICD-10-CM

## 2010-09-29 DIAGNOSIS — D3A092 Benign carcinoid tumor of the stomach: Secondary | ICD-10-CM

## 2010-09-29 DIAGNOSIS — Z86012 Personal history of benign carcinoid tumor: Secondary | ICD-10-CM | POA: Insufficient documentation

## 2010-09-29 DIAGNOSIS — K219 Gastro-esophageal reflux disease without esophagitis: Secondary | ICD-10-CM

## 2010-09-29 DIAGNOSIS — I10 Essential (primary) hypertension: Secondary | ICD-10-CM

## 2010-09-29 DIAGNOSIS — E7800 Pure hypercholesterolemia, unspecified: Secondary | ICD-10-CM

## 2010-09-29 DIAGNOSIS — R079 Chest pain, unspecified: Secondary | ICD-10-CM

## 2010-09-29 DIAGNOSIS — I498 Other specified cardiac arrhythmias: Secondary | ICD-10-CM

## 2010-09-29 DIAGNOSIS — D131 Benign neoplasm of stomach: Secondary | ICD-10-CM

## 2010-09-29 DIAGNOSIS — R634 Abnormal weight loss: Secondary | ICD-10-CM

## 2010-09-29 LAB — CBC WITH DIFFERENTIAL/PLATELET
Basophils Relative: 1.5 % (ref 0.0–3.0)
Eosinophils Absolute: 0 10*3/uL (ref 0.0–0.7)
Lymphocytes Relative: 21 % (ref 12.0–46.0)
MCHC: 33.3 g/dL (ref 30.0–36.0)
MCV: 81.8 fl (ref 78.0–100.0)
Monocytes Absolute: 0.7 10*3/uL (ref 0.1–1.0)
Neutrophils Relative %: 69.5 % (ref 43.0–77.0)
Platelets: 225 10*3/uL (ref 150.0–400.0)
RBC: 4.32 Mil/uL (ref 3.87–5.11)
WBC: 9.1 10*3/uL (ref 4.5–10.5)

## 2010-09-29 LAB — FERRITIN: Ferritin: 4.9 ng/mL — ABNORMAL LOW (ref 10.0–291.0)

## 2010-09-29 NOTE — Assessment & Plan Note (Signed)
Stable With gastroparesis and GERD stay on PPI for now

## 2010-09-29 NOTE — Progress Notes (Signed)
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 I spent 15-20 minutes going over always issues including or benign fundic gland polyps and how I thought that there was an isolated small carcinoid tumor less than 1 cm that was removed at her July EGD. She has no specific complaints today, she is wondering about switching to a generic PPI. If she does not take a PPI she will have heartburn problems. She does have GERD and gastroparesis.

## 2010-09-29 NOTE — Assessment & Plan Note (Signed)
I have asked the patient to keep her appointment with Dr. Ladona Ridgel. No change in therapy is indicated. If she remains bradycardic that might be discontinued beta blocker.

## 2010-09-29 NOTE — Assessment & Plan Note (Addendum)
Check ferritin and cbc, question a component of chronic disease anemia  She is due for a repeat screening colonoscopy having last had one in 2002. Would probably still treated as a screening given the chronicity of this anemia.

## 2010-09-29 NOTE — Assessment & Plan Note (Addendum)
Monitor mild weight loss. Question if it's related to some underlying problem but do not think it's related to the carcinoid tumor that I believe is completely removed. The weight repeat endoscopic evaluation as well.

## 2010-09-29 NOTE — Progress Notes (Signed)
HPI The patient presents for evaluation of SVT.  Since she was last seen she has been on the beta blocker.  She has had no SVT.  She does report some slow heart rates and dizziness (no clear association) but no syncope or presyncope.  He has had no new shortness of breath, PND or orthopnea. Her echocardiogram demonstrated some aortic valve calcification but was otherwise unremarkable. Because she was not having any further chest discomfort she canceled a nuclear study. She denies chest pressure, neck or arm discomfort. She is fatigued and under stress caring for her husband.  No Known Allergies  Current Outpatient Prescriptions  Medication Sig Dispense Refill  . aspirin 81 MG tablet Take 81 mg by mouth daily.        Marland Kitchen atorvastatin (LIPITOR) 80 MG tablet Take 40 mg by mouth daily.        . cholecalciferol (VITAMIN D) 1000 UNITS tablet Take 1,000 Units by mouth daily.        . clidinium-chlordiazePOXIDE (LIBRAX) 2.5-5 MG per capsule Take 1 capsule by mouth 3 (three) times daily as needed. For pain  60 capsule  1  . esomeprazole (NEXIUM) 40 MG capsule Take 40 mg by mouth daily.        . Ferrous Sulfate (IRON) 28 MG TABS Take by mouth daily.        . FOLBIC 2.5-25-2 MG TABS TAKE 1 TABLET BY MOUTH EVERY DAY  90 tablet  2  . Glucose Blood (ASCENSIA CONTOUR TEST VI) 1 each by In Vitro route 4 (four) times daily.        . metFORMIN (GLUCOPHAGE-XR) 500 MG 24 hr tablet Take 1,000 mg by mouth 2 (two) times daily.        . metoCLOPramide (REGLAN) 5 MG tablet Take 5 mg by mouth 2 (two) times daily. Before breakfast and at bedtime       . metoprolol tartrate (LOPRESSOR) 25 MG tablet Take 1 tablet (25 mg total) by mouth 2 (two) times daily.  60 tablet  11  . Multiple Vitamin (MULTIVITAMIN) tablet Take 1 tablet by mouth daily.        . Multiple Vitamins-Minerals (VISION FORMULA PO) Take by mouth.        . ondansetron (ZOFRAN) 4 MG tablet Take 1 tablet (4 mg total) by mouth every 4 (four) hours as needed.  30  tablet  1  . traMADol (ULTRAM) 50 MG tablet Take 1 tablet (50 mg total) by mouth every 6 (six) hours as needed for pain.  20 tablet  0  . valsartan-hydrochlorothiazide (DIOVAN-HCT) 320-12.5 MG per tablet Take 1 tablet by mouth daily.  90 tablet  3  . vitamin E 100 UNIT capsule Take by mouth daily.          Past Medical History  Diagnosis Date  . Gastroparesis   . Iron deficiency anemia, unspecified   . Pure hypercholesterolemia   . Headache   . Unspecified essential hypertension   . Esophageal reflux   . Type II or unspecified type diabetes mellitus without mention of complication, not stated as uncontrolled   . Arthritis     Spinal Osteoarthritis  . Diverticulosis 08/30/2000    Colonoscopy   . Gastric polyp     Fundic Gland  . Anxiety   . Cataract   . Heart murmur     Echocardiogram 2/11: EF 60-65%, mild LAE, grade 1 diastolic dysfunction, aortic valve sclerosis, mean gradient 9 mm of mercury, PASP 34  .  Stroke     tia  . Chest pain     Myoview 1/11: EF 82%, no ischemia.  Marland Kitchen PSVT (paroxysmal supraventricular tachycardia)   . Carcinoid tumor of stomach     Past Surgical History  Procedure Date  . Cholecystectomy 1993  . Tonsillectomy   . Colonoscopy 2002  . Esophagogastroduodenoscopy 08/29/2010; 09/15/2010    7/12: Neuroendocrine tumor    ROS: PHYSICAL EXAM BP 130/68  Pulse 60  Resp 16  Ht 5\' 2"  (1.575 m)  Wt 167 lb (75.751 kg)  BMI 30.54 kg/m2 GENERAL:  Well appearing HEENT:  Pupils equal round and reactive, fundi not visualized, oral mucosa unremarkable NECK:  No jugular venous distention, waveform within normal limits, carotid upstroke brisk and symmetric, no bruits, no thyromegaly LYMPHATICS:  No cervical, inguinal adenopathy LUNGS:  Clear to auscultation bilaterally BACK:  No CVA tenderness CHEST:  Unremarkable HEART:  PMI not displaced or sustained,S1 and S2 within normal limits, no S3, no S4, no clicks, no rubs, slight apical systolic early peaking  murmur ABD:  Flat, positive bowel sounds normal in frequency in pitch, no bruits, no rebound, no guarding, no midline pulsatile mass, no hepatomegaly, no splenomegaly EXT:  2 plus pulses throughout, no edema, no cyanosis no clubbing SKIN:  No rashes no nodules NEURO:  Cranial nerves II through XII grossly intact, motor grossly intact throughout PSYCH:  Cognitively intact, oriented to person place and time  ASSESSMENT AND PLAN

## 2010-09-29 NOTE — Patient Instructions (Signed)
Please go to the basement upon leaving today to have your labs done. We will contact you with results, assessments and plans.

## 2010-09-29 NOTE — Assessment & Plan Note (Signed)
I explained I thought this may be completely removed. For completeness we will have her undergo an upper endoscopy with endoscopic ultrasound where Dr. Christella Hartigan will assess the thickening of the gastric wall, as well as biopsy of the stomach again. Anticipate doing this in September.

## 2010-09-29 NOTE — Assessment & Plan Note (Signed)
Explained that these are benign and no ablation or resection indicated

## 2010-09-29 NOTE — Assessment & Plan Note (Signed)
She is no longer having chest pain. She canceled the nuclear study and I don't think he needs to be rescheduled at this point.

## 2010-09-29 NOTE — Patient Instructions (Signed)
Follow up in 6 months with Dr Hochrein.  You will receive a letter in the mail 2 months before you are due.  Please call us when you receive this letter to schedule your follow up appointment.   The current medical regimen is effective;  continue present plan and medications.  

## 2010-09-29 NOTE — Assessment & Plan Note (Signed)
The blood pressure is at target. No change in medications is indicated. We will continue with therapeutic lifestyle changes (TLC).  

## 2010-09-30 ENCOUNTER — Other Ambulatory Visit (INDEPENDENT_AMBULATORY_CARE_PROVIDER_SITE_OTHER): Payer: Medicare Other

## 2010-09-30 ENCOUNTER — Ambulatory Visit (INDEPENDENT_AMBULATORY_CARE_PROVIDER_SITE_OTHER): Payer: Medicare Other | Admitting: Endocrinology

## 2010-09-30 ENCOUNTER — Encounter: Payer: Self-pay | Admitting: Endocrinology

## 2010-09-30 DIAGNOSIS — M791 Myalgia, unspecified site: Secondary | ICD-10-CM | POA: Insufficient documentation

## 2010-09-30 DIAGNOSIS — E119 Type 2 diabetes mellitus without complications: Secondary | ICD-10-CM

## 2010-09-30 DIAGNOSIS — IMO0001 Reserved for inherently not codable concepts without codable children: Secondary | ICD-10-CM

## 2010-09-30 DIAGNOSIS — J309 Allergic rhinitis, unspecified: Secondary | ICD-10-CM

## 2010-09-30 LAB — CK: Total CK: 198 U/L — ABNORMAL HIGH (ref 7–177)

## 2010-09-30 LAB — HEMOGLOBIN A1C: Hgb A1c MFr Bld: 6.6 % — ABNORMAL HIGH (ref 4.6–6.5)

## 2010-09-30 MED ORDER — VALSARTAN-HYDROCHLOROTHIAZIDE 160-12.5 MG PO TABS: 1.0000 | ORAL_TABLET | Freq: Every day | ORAL | Status: DC

## 2010-09-30 NOTE — Progress Notes (Signed)
Subjective:    [name], female    DOB: 1940/08/22, 70 y.o.   MRN: [id number]  HPI Pt states of moderate weakness of the entire lower extremities, and assoc pain.  She says this is different from the sxs for which she saw dr Felicity Coyer a few week ago.  sxs are worse with exertion.   She now takes just 40 mg of lipitor. She has intermittent cbg's as low as 75, and wants to know if she can reduce the metformin Past Medical History  Diagnosis Date  . Gastroparesis   . Iron deficiency anemia, unspecified   . Pure hypercholesterolemia   . Headache   . Unspecified essential hypertension   . Esophageal reflux   . Type II or unspecified type diabetes mellitus without mention of complication, not stated as uncontrolled   . Arthritis     Spinal Osteoarthritis  . Diverticulosis 08/30/2000    Colonoscopy   . Gastric polyp     Fundic Gland  . Anxiety   . Cataract   . Heart murmur     Echocardiogram 2/11: EF 60-65%, mild LAE, grade 1 diastolic dysfunction, aortic valve sclerosis, mean gradient 9 mm of mercury, PASP 34  . Stroke     tia  . Chest pain     Myoview 1/11: EF 82%, no ischemia.  Marland Kitchen PSVT (paroxysmal supraventricular tachycardia)   . Carcinoid tumor of stomach     Past Surgical History  Procedure Date  . Cholecystectomy 1993  . Tonsillectomy   . Colonoscopy 2002  . Esophagogastroduodenoscopy 08/29/2010; 09/15/2010    7/12: Neuroendocrine tumor    History   Social History  . Marital Status: Married    Spouse Name: N/A    Number of Children: N/A  . Years of Education: N/A   Occupational History  . Retail    Social History Main Topics  . Smoking status: Never Smoker   . Smokeless tobacco: Never Used   Comment: Daily Caffeine Use -1  . Alcohol Use: No  . Drug Use: No  . Sexually Active: Not on file   Other Topics Concern  . Not on file   Social History Narrative   One caffeine drink daily     Current Outpatient Prescriptions on File Prior to  Visit  Medication Sig Dispense Refill  . aspirin 81 MG tablet Take 81 mg by mouth daily.        Marland Kitchen atorvastatin (LIPITOR) 80 MG tablet Take 40 mg by mouth daily.        . cholecalciferol (VITAMIN D) 1000 UNITS tablet Take 1,000 Units by mouth daily.        . clidinium-chlordiazePOXIDE (LIBRAX) 2.5-5 MG per capsule Take 1 capsule by mouth 3 (three) times daily as needed. For pain  60 capsule  1  . esomeprazole (NEXIUM) 40 MG capsule Take 40 mg by mouth daily.        . Ferrous Sulfate (IRON) 28 MG TABS Take by mouth daily.        . FOLBIC 2.5-25-2 MG TABS TAKE 1 TABLET BY MOUTH EVERY DAY  90 tablet  2  . Glucose Blood (ASCENSIA CONTOUR TEST VI) 1 each by In Vitro route 4 (four) times daily.        . metFORMIN (GLUCOPHAGE-XR) 500 MG 24 hr tablet Take 1,000 mg by mouth 2 (two) times daily.        . metoCLOPramide (REGLAN) 5 MG tablet Take 5 mg by mouth 2 (two) times  daily. Before breakfast and at bedtime       . metoprolol tartrate (LOPRESSOR) 25 MG tablet Take 1 tablet (25 mg total) by mouth 2 (two) times daily.  60 tablet  11  . Multiple Vitamin (MULTIVITAMIN) tablet Take 1 tablet by mouth daily.        . Multiple Vitamins-Minerals (VISION FORMULA PO) Take by mouth.        . ondansetron (ZOFRAN) 4 MG tablet Take 1 tablet (4 mg total) by mouth every 4 (four) hours as needed.  30 tablet  1  . traMADol (ULTRAM) 50 MG tablet Take 1 tablet (50 mg total) by mouth every 6 (six) hours as needed for pain.  20 tablet  0  . vitamin E 100 UNIT capsule Take by mouth daily.          No Known Allergies  Family History  Problem Relation Age of Onset  . Diabetes Mother   . Stroke Father   . Heart disease Sister   . Heart disease Brother   . Colon cancer Neg Hx     BP 108/52  Pulse 73  Temp(Src) 98 F (36.7 C) (Oral)  Ht 5\' 2"  (1.575 m)  Wt 168 lb 6.4 oz (76.386 kg)  BMI 30.80 kg/m2  SpO2 98%      Review of Systems  Constitutional: Negative for fever.  HENT:       Chronic nasal congestion    Eyes: Positive for pain.  Respiratory: Negative for shortness of breath.   Musculoskeletal: Positive for arthralgias.  Neurological: Negative for syncope.  Psychiatric/Behavioral: Negative for sleep disturbance.   Denies urinary or fecal retention.  Denies rash.      Objective:   Physical Exam GENERAL: no distress. head: no deformity eyes: no periorbital swelling, no proptosis external nose and ears are normal mouth: no lesion seen Both eac's and tm's are normal Thighs:  nontender. Gait: normal and steady. Legs: normal strength. Pulses: dorsalis pedis intact bilat.   Feet: no deformity.  no ulcer on the feet.  feet are of normal color and temp.  no edema. Neuro: sensation is intact to touch on the feet.   (i reviewed pncv and emg from 2009) Lab Results  Component Value Date   CHOL 150 03/08/2010   CHOL 173 06/28/2009   CHOL 175 06/08/2008   Lab Results  Component Value Date   HDL 57.40 03/08/2010   HDL 57.84 06/28/2009   HDL 69.62 06/08/2008   Lab Results  Component Value Date   LDLCALC 72 03/08/2010   LDLCALC 95 06/28/2009   LDLCALC 84 06/08/2008   Lab Results  Component Value Date   TRIG 102.0 03/08/2010   TRIG 98.0 06/28/2009   TRIG 134.0 06/08/2008   Lab Results  Component Value Date   CHOLHDL 3 03/08/2010   CHOLHDL 3 06/28/2009   CHOLHDL 3 06/08/2008   Lab Results  Component Value Date   LDLDIRECT 195.3 05/11/2006      Assessment & Plan:  Dm, well-controlled Leg weakness, uncertain etiology Dyslipidemia, well-controlled on lipitor.  It is unlikely that lipitor is causing her sxs, even if they resolve off of it. Allergic rhinitis, new Htn, slightly overcontrolled.

## 2010-09-30 NOTE — Patient Instructions (Addendum)
Try not taking the lipitor for 1 week.   Reduce diovan-hct to 160/12.5, 1 daily. Please make a follow-up appointment in 1 month. Here are some samples of "nasonex."  Take 2 sprays daily, on a trial basis.  It might take a week to work. blood tests are being requested for you today.  please call [phone] to hear your test results.  You will be prompted to enter the 9-digit "MRN" number that appears at the top left of this page, followed by #.  Then you will hear the message. (update: i left message on phone-tree:  Try changing the time of day you take the metformin, to minimize cbg's in the70's)

## 2010-09-30 NOTE — Progress Notes (Signed)
Quick Note:  Ferritin is low - I think she should do her colonoscopy now - next with me and then will get EUS with Dr. Christella Hartigan depending upon what the colonoscopy shows If she wants to discuss with me will be next week before I can call her ______

## 2010-10-01 DIAGNOSIS — J309 Allergic rhinitis, unspecified: Secondary | ICD-10-CM | POA: Insufficient documentation

## 2010-10-03 ENCOUNTER — Telehealth: Payer: Self-pay | Admitting: Internal Medicine

## 2010-10-03 NOTE — Telephone Encounter (Signed)
Patient is scheduled for colon 11/11/10 8:30 and previsit scheduled for 11/02/10 10:30

## 2010-10-05 ENCOUNTER — Telehealth: Payer: Self-pay | Admitting: Internal Medicine

## 2010-10-05 NOTE — Telephone Encounter (Signed)
Patient wants to have her procedure earlier than 11/11/10, but does not want a pm appt.  I have advised her to call and check for cancellations for morning appts.  She is offered multiple pm appts that she refuses.  She will call back to check for cancellations.

## 2010-10-07 ENCOUNTER — Telehealth: Payer: Self-pay

## 2010-10-07 NOTE — Telephone Encounter (Signed)
Pt called stating she has had some improvement in muscle pain since discontinuing Lipitor but she has also started her physical therapy exercises. Pt is requesting MD advisement, should she re-start Lipitor?

## 2010-10-07 NOTE — Telephone Encounter (Signed)
Please give it another 1-2 weeks off the lipitor

## 2010-10-11 ENCOUNTER — Telehealth: Payer: Self-pay | Admitting: *Deleted

## 2010-10-11 NOTE — Telephone Encounter (Signed)
Pt advised and understood. 

## 2010-10-11 NOTE — Telephone Encounter (Signed)
Pt is requesting callback regarding order for diabetic supplies from [facility], left message for pt to callback office. Called earlier, pt's line was busy.

## 2010-10-12 NOTE — Telephone Encounter (Signed)
Pt states that she no longer wants to use [facility] for her diabetic supplies and that she now wants to use [facility] for her testing supplies. Pt informed order from [facility] was received and faxed to [facility] today.

## 2010-10-13 ENCOUNTER — Telehealth: Payer: Self-pay | Admitting: Internal Medicine

## 2010-10-13 NOTE — Telephone Encounter (Signed)
I have left a message for the patient's sister.  I did also speak with the patient .  She wants to move colon up, I have advised her that right now there are no earlier appts.  She will continue to call and check for cancelations

## 2010-10-14 ENCOUNTER — Encounter: Payer: Self-pay | Admitting: Internal Medicine

## 2010-10-14 ENCOUNTER — Ambulatory Visit (INDEPENDENT_AMBULATORY_CARE_PROVIDER_SITE_OTHER): Payer: Medicare Other | Admitting: Internal Medicine

## 2010-10-14 DIAGNOSIS — I10 Essential (primary) hypertension: Secondary | ICD-10-CM

## 2010-10-14 DIAGNOSIS — I498 Other specified cardiac arrhythmias: Secondary | ICD-10-CM

## 2010-10-14 NOTE — Telephone Encounter (Signed)
Patient called again about moving up her colon.  I have offered her appts next week in the afternoon, and the hospital the week of 9/17.  She declines all of these and wants to keep her appt for 11/11/10

## 2010-10-14 NOTE — Progress Notes (Signed)
HPI [name] is referred today for evaluation of SVT. The patient is a very pleasant 70 year old woman who has a history of tachypalpitations dating back over a year ago. She has had 3 episodes of SVT. 2 have required emergency room visits and treatment with adenosine. These episodes start and stop suddenly. They were associated with chest pressure, shortness of breath, weakness, and dizziness. She has not had frank syncope. The patient has had documented SVT at rates of over 150 beats per minute. Review of her EKG demonstrates a short RP tachycardia. No Known Allergies   Current Outpatient Prescriptions  Medication Sig Dispense Refill  . aspirin 81 MG tablet Take 81 mg by mouth daily.        Marland Kitchen atorvastatin (LIPITOR) 80 MG tablet Take 40 mg by mouth daily.        . cholecalciferol (VITAMIN D) 1000 UNITS tablet Take 1,000 Units by mouth daily.        . clidinium-chlordiazePOXIDE (LIBRAX) 2.5-5 MG per capsule Take 1 capsule by mouth 3 (three) times daily as needed. For pain  60 capsule  1  . esomeprazole (NEXIUM) 40 MG capsule Take 40 mg by mouth daily.        . Ferrous Sulfate (IRON) 28 MG TABS Take by mouth daily.        . FOLBIC 2.5-25-2 MG TABS TAKE 1 TABLET BY MOUTH EVERY DAY  90 tablet  2  . Glucose Blood (ASCENSIA CONTOUR TEST VI) 1 each by In Vitro route 4 (four) times daily.        . metFORMIN (GLUCOPHAGE-XR) 500 MG 24 hr tablet Take 1,000 mg by mouth 2 (two) times daily.        . metoCLOPramide (REGLAN) 5 MG tablet Take 5 mg by mouth 2 (two) times daily. Before breakfast and at bedtime       . metoprolol tartrate (LOPRESSOR) 25 MG tablet Take 1 tablet (25 mg total) by mouth 2 (two) times daily.  60 tablet  11  . Multiple Vitamin (MULTIVITAMIN) tablet Take 1 tablet by mouth daily.        . Multiple Vitamins-Minerals (VISION FORMULA PO) Take by mouth.        . ondansetron (ZOFRAN) 4 MG tablet Take 1 tablet (4 mg total) by mouth every 4 (four) hours as needed.  30 tablet  1  . traMADol  (ULTRAM) 50 MG tablet Take 1 tablet (50 mg total) by mouth every 6 (six) hours as needed for pain.  20 tablet  0  . valsartan-hydrochlorothiazide (DIOVAN-HCT) 320-12.5 MG per tablet Take 1 tablet by mouth daily.        . vitamin E 100 UNIT capsule Take by mouth daily.           Past Medical History  Diagnosis Date  . Gastroparesis   . Iron deficiency anemia, unspecified   . Pure hypercholesterolemia   . Headache   . Unspecified essential hypertension   . Esophageal reflux   . Type II or unspecified type diabetes mellitus without mention of complication, not stated as uncontrolled   . Arthritis     Spinal Osteoarthritis  . Diverticulosis 08/30/2000    Colonoscopy   . Gastric polyp     Fundic Gland  . Anxiety   . Cataract   . Heart murmur     Echocardiogram 2/11: EF 60-65%, mild LAE, grade 1 diastolic dysfunction, aortic valve sclerosis, mean gradient 9 mm of mercury, PASP 34  . Stroke  tia  . Chest pain     Myoview 1/11: EF 82%, no ischemia.  Marland Kitchen PSVT (paroxysmal supraventricular tachycardia)   . Carcinoid tumor of stomach     ROS:   All systems reviewed and negative except as noted in the HPI.   Past Surgical History  Procedure Date  . Cholecystectomy 1993  . Tonsillectomy   . Colonoscopy 2002  . Esophagogastroduodenoscopy 08/29/2010; 09/15/2010    7/12: Neuroendocrine tumor     Family History  Problem Relation Age of Onset  . Diabetes Mother   . Stroke Father   . Heart disease Sister   . Heart disease Brother   . Colon cancer Neg Hx      History   Social History  . Marital Status: Married    Spouse Name: N/A    Number of Children: N/A  . Years of Education: N/A   Occupational History  . Retail    Social History Main Topics  . Smoking status: Never Smoker   . Smokeless tobacco: Never Used   Comment: Daily Caffeine Use -1  . Alcohol Use: No  . Drug Use: No  . Sexually Active: Not on file   Other Topics Concern  . Not on file   Social History  Narrative   One caffeine drink daily      BP 125/68  Pulse 71  Ht 5\' 4"  (1.626 m)  Wt 168 lb (76.204 kg)  BMI 28.84 kg/m2  Physical Exam:  Well appearing NAD HEENT: Unremarkable Neck:  No JVD, no thyromegally Lymphatics:  No adenopathy Back:  No CVA tenderness Lungs:  Clear with no wheezes, rales, or rhonchi. HEART:  Regular rate rhythm, no murmurs, no rubs, no clicks Abd:  soft, positive bowel sounds, no organomegally, no rebound, no guarding Ext:  2 plus pulses, no edema, no cyanosis, no clubbing Skin:  No rashes no nodules Neuro:  CN II through XII intact, motor grossly intact  EKG Normal sinus rhythm. No ventricular preexcitation.  Assess/Plan:

## 2010-10-14 NOTE — Assessment & Plan Note (Signed)
I have discussed with him the options with the patient. The risk, goals, benefits, and expectations of catheter ablation of the patient's SVT have been discussed in detail. The patient is considering her options and will call us if she decides to proceed with catheter ablation.

## 2010-10-14 NOTE — Assessment & Plan Note (Signed)
Her blood pressure is well controlled. She will continue her current medical therapy. She will maintain a low-sodium diet. 

## 2010-10-31 ENCOUNTER — Encounter: Payer: Self-pay | Admitting: Endocrinology

## 2010-10-31 ENCOUNTER — Ambulatory Visit (INDEPENDENT_AMBULATORY_CARE_PROVIDER_SITE_OTHER): Payer: Medicare Other | Admitting: Endocrinology

## 2010-10-31 VITALS — BP 112/68 | HR 61 | Temp 97.7°F | Ht 64.0 in | Wt 171.0 lb

## 2010-10-31 DIAGNOSIS — I10 Essential (primary) hypertension: Secondary | ICD-10-CM

## 2010-10-31 DIAGNOSIS — Z23 Encounter for immunization: Secondary | ICD-10-CM

## 2010-10-31 NOTE — Progress Notes (Signed)
Subjective:    [name], female    DOB: 1940-12-30, 70 y.o.   MRN: [id number]  HPI The state of at least three ongoing medical problems is addressed today: Myalgias: pt says much less since off the lipitor.   Htn: She still takes the diovan-hct 320/12.5.  She says bp at home is sometimes over 140 systolic.  Allergic rhinitis: Pt says the nasonex helps.  Past Medical History  Diagnosis Date  . Gastroparesis   . Iron deficiency anemia, unspecified   . Pure hypercholesterolemia   . Headache   . Unspecified essential hypertension   . Esophageal reflux   . Type II or unspecified type diabetes mellitus without mention of complication, not stated as uncontrolled   . Arthritis     Spinal Osteoarthritis  . Diverticulosis 08/30/2000    Colonoscopy   . Gastric polyp     Fundic Gland  . Anxiety   . Cataract   . Heart murmur     Echocardiogram 2/11: EF 60-65%, mild LAE, grade 1 diastolic dysfunction, aortic valve sclerosis, mean gradient 9 mm of mercury, PASP 34  . Stroke     tia  . Chest pain     Myoview 1/11: EF 82%, no ischemia.  Marland Kitchen PSVT (paroxysmal supraventricular tachycardia)   . Carcinoid tumor of stomach     Past Surgical History  Procedure Date  . Cholecystectomy 1993  . Tonsillectomy   . Colonoscopy 2002  . Esophagogastroduodenoscopy 08/29/2010; 09/15/2010    7/12: Neuroendocrine tumor    History   Social History  . Marital Status: Married    Spouse Name: N/A    Number of Children: N/A  . Years of Education: N/A   Occupational History  . Retail    Social History Main Topics  . Smoking status: Never Smoker   . Smokeless tobacco: Never Used   Comment: Daily Caffeine Use -1  . Alcohol Use: No  . Drug Use: No  . Sexually Active: Not on file   Other Topics Concern  . Not on file   Social History Narrative   One caffeine drink daily     Current Outpatient Prescriptions on File Prior to Visit  Medication Sig Dispense Refill  . aspirin  81 MG tablet Take 81 mg by mouth daily.        . cholecalciferol (VITAMIN D) 1000 UNITS tablet Take 1,000 Units by mouth daily.        . clidinium-chlordiazePOXIDE (LIBRAX) 2.5-5 MG per capsule Take 1 capsule by mouth 3 (three) times daily as needed. For pain  60 capsule  1  . esomeprazole (NEXIUM) 40 MG capsule Take 40 mg by mouth daily.        . Ferrous Sulfate (IRON) 28 MG TABS Take by mouth daily.        . FOLBIC 2.5-25-2 MG TABS TAKE 1 TABLET BY MOUTH EVERY DAY  90 tablet  2  . Glucose Blood (ASCENSIA CONTOUR TEST VI) 1 each by In Vitro route 4 (four) times daily.        . metFORMIN (GLUCOPHAGE-XR) 500 MG 24 hr tablet Take 1,000 mg by mouth 2 (two) times daily.        . metoCLOPramide (REGLAN) 5 MG tablet Take 5 mg by mouth 2 (two) times daily. Before breakfast and at bedtime       . metoprolol tartrate (LOPRESSOR) 25 MG tablet Take 1 tablet (25 mg total) by mouth 2 (two) times daily.  60 tablet  11  . Multiple Vitamin (MULTIVITAMIN) tablet Take 1 tablet by mouth daily.        . Multiple Vitamins-Minerals (VISION FORMULA PO) Take by mouth.        . ondansetron (ZOFRAN) 4 MG tablet Take 1 tablet (4 mg total) by mouth every 4 (four) hours as needed.  30 tablet  1  . traMADol (ULTRAM) 50 MG tablet Take 1 tablet (50 mg total) by mouth every 6 (six) hours as needed for pain.  20 tablet  0  . valsartan-hydrochlorothiazide (DIOVAN-HCT) 320-12.5 MG per tablet Take 1 tablet by mouth daily.        . vitamin E 100 UNIT capsule Take by mouth daily.          No Known Allergies  Family History  Problem Relation Age of Onset  . Diabetes Mother   . Stroke Father   . Heart disease Sister   . Heart disease Brother   . Colon cancer Neg Hx     BP 112/68  Pulse 61  Temp(Src) 97.7 F (36.5 C) (Oral)  Ht 5\' 4"  (1.626 m)  Wt 171 lb (77.565 kg)  BMI 29.35 kg/m2  SpO2 98%    Review of Systems Denies chest pain and sob    Objective:   Physical Exam VITAL SIGNS:  See vs page GENERAL: no  distress LUNGS:  Clear to auscultation HEART: Regular rate and rhythm without murmurs noted. Normal S1,S2.   Pulses: no carotid bruit       Assessment & Plan:  Dyslipidemia, therapy is limited by perceived drug intolerance Allergic rhinitis, well-controlled Htn, overcontrolled

## 2010-10-31 NOTE — Patient Instructions (Addendum)
Please re-try the lipitor at 20 mg daily.   continue diovan-hct to 320/12.5, 1 daily. blood tests are being requested for you today.  please call [phone] to hear your test results.  You will be prompted to enter the 9-digit "MRN" number that appears at the top left of this page, followed by #.  Then you will hear the message. Let me know if yo want me to send a generic nasal spray like the nasonex to the pharmacy for you.   Please make a follow-up appointment in 4 months. If your leg pain cms back, please call, and i would be happy to prescribe you a pain medication.

## 2010-11-02 ENCOUNTER — Ambulatory Visit (AMBULATORY_SURGERY_CENTER): Payer: Medicare Other | Admitting: *Deleted

## 2010-11-02 VITALS — Ht 62.0 in | Wt 171.0 lb

## 2010-11-02 DIAGNOSIS — Z1211 Encounter for screening for malignant neoplasm of colon: Secondary | ICD-10-CM

## 2010-11-02 MED ORDER — PEG-KCL-NACL-NASULF-NA ASC-C 100 G PO SOLR: ORAL | Status: DC

## 2010-11-07 ENCOUNTER — Other Ambulatory Visit: Payer: Self-pay | Admitting: *Deleted

## 2010-11-07 ENCOUNTER — Telehealth: Payer: Self-pay | Admitting: Internal Medicine

## 2010-11-07 DIAGNOSIS — I10 Essential (primary) hypertension: Secondary | ICD-10-CM

## 2010-11-07 MED ORDER — METFORMIN HCL ER 500 MG PO TB24: 1000.0000 mg | ORAL_TABLET | Freq: Two times a day (BID) | ORAL | Status: DC

## 2010-11-07 NOTE — Telephone Encounter (Signed)
Patient's sister is very worried for her.  She reports that she is thinking of canceling the procedure Friday because of potential complications.  I tried to assure the sister that this is a safe routine procedure but could not guarantee no complications.  She thinks it would help if Dr Leone Payor could call the patient to reassure her.

## 2010-11-07 NOTE — Telephone Encounter (Signed)
R'cd fax from [facility] for refill of Metformin  Last OV-10/31/2010  Last filled-07/26/2010

## 2010-11-07 NOTE — Telephone Encounter (Signed)
Patient would like to talk to you about the procedure on Friday.

## 2010-11-08 NOTE — Telephone Encounter (Signed)
I called the patient and we reviewed the procedure and plans to be as safe as possible.

## 2010-11-09 ENCOUNTER — Telehealth: Payer: Self-pay | Admitting: Radiology

## 2010-11-09 ENCOUNTER — Other Ambulatory Visit: Payer: Self-pay | Admitting: *Deleted

## 2010-11-09 DIAGNOSIS — R000 Tachycardia, unspecified: Secondary | ICD-10-CM

## 2010-11-09 MED ORDER — METOPROLOL TARTRATE 25 MG PO TABS: 25.0000 mg | ORAL_TABLET | Freq: Two times a day (BID) | ORAL | Status: DC

## 2010-11-09 NOTE — Telephone Encounter (Signed)
Pt states that she does NOT want to schedule 3 mo f/u Conservative Rx of Varicose Veins at present.  States that she will call as needed for future follow ups.

## 2010-11-10 ENCOUNTER — Other Ambulatory Visit: Payer: Self-pay

## 2010-11-11 ENCOUNTER — Ambulatory Visit (AMBULATORY_SURGERY_CENTER): Payer: Medicare Other | Admitting: Internal Medicine

## 2010-11-11 ENCOUNTER — Encounter: Payer: Self-pay | Admitting: Internal Medicine

## 2010-11-11 VITALS — BP 155/76 | HR 69 | Temp 99.0°F | Resp 17 | Ht 62.0 in | Wt 175.0 lb

## 2010-11-11 DIAGNOSIS — K5730 Diverticulosis of large intestine without perforation or abscess without bleeding: Secondary | ICD-10-CM

## 2010-11-11 DIAGNOSIS — Z1211 Encounter for screening for malignant neoplasm of colon: Secondary | ICD-10-CM

## 2010-11-11 HISTORY — PX: COLONOSCOPY: SHX174

## 2010-11-11 LAB — GLUCOSE, CAPILLARY
Glucose-Capillary: 134 mg/dL — ABNORMAL HIGH (ref 70–99)
Glucose-Capillary: 157 mg/dL — ABNORMAL HIGH (ref 70–99)

## 2010-11-11 MED ORDER — SODIUM CHLORIDE 0.9 % IV SOLN: 500.0000 mL | INTRAVENOUS | Status: DC

## 2010-11-11 NOTE — Patient Instructions (Signed)
The colonoscopy was ok. There is diverticulosis (you have had this a long time) but no polyps or cancer were seen. You still need to have an endoscopic ultrasound of your stomach and we will arrange this. We will call you about this in the next 1-2 weeks. Iva Boop, MD, [facility]

## 2010-11-14 ENCOUNTER — Telehealth: Payer: Self-pay | Admitting: *Deleted

## 2010-11-14 NOTE — Telephone Encounter (Signed)

## 2010-11-21 ENCOUNTER — Telehealth: Payer: Self-pay | Admitting: Internal Medicine

## 2010-11-21 NOTE — Telephone Encounter (Signed)
Patient advised that Dr. Christella Hartigan has her info and is working on scheduling EUS.  She is advised that she will be contacted once it has been scheduled

## 2010-11-22 ENCOUNTER — Telehealth: Payer: Self-pay

## 2010-11-22 DIAGNOSIS — R933 Abnormal findings on diagnostic imaging of other parts of digestive tract: Secondary | ICD-10-CM

## 2010-11-22 DIAGNOSIS — D3A00 Benign carcinoid tumor of unspecified site: Secondary | ICD-10-CM

## 2010-11-22 NOTE — Telephone Encounter (Signed)
Dr Christella Hartigan you have NO propofol spots on Thursday's as far as our schedule goes.  Do you want to schedule on another day of the week or during your hospital week?

## 2010-11-22 NOTE — Telephone Encounter (Signed)
Message copied by Donata Duff on Tue Nov 22, 2010  3:17 PM ------      Message from: Rachael Fee      Created: Mon Nov 21, 2010 12:06 PM      Regarding: RE: needs EUS       Charlynn Salih,      She needs upper EUS, radial +/- linear, + propofol, next available [facility] EUS day for abnormal stomach, h/o carcinoid            thanks                  ----- Message -----         From: Iva Boop, MD         Sent: 11/16/2010   3:51 PM           To: Rob Bunting, MD      Subject: needs EUS                                                This lady still needs an EUS - she is very nervous about a gastric carcinoid that may be removed, also had a thickened stomach wall on CT though EGD unrevealinh.            Colonoscpy ok. We were doing that first.            You will probably remember her but let me know if ?'s

## 2010-11-23 NOTE — Telephone Encounter (Signed)
Lets try her without propofol, next available EUS Thursday at [facility].  thanks

## 2010-11-24 NOTE — Telephone Encounter (Signed)
Pt needs to be instructed and meds reviewed 

## 2010-11-24 NOTE — Telephone Encounter (Signed)
Addended by: Donata Duff on: 11/24/2010 08:58 AM   Modules accepted: Orders

## 2010-11-24 NOTE — Telephone Encounter (Signed)
Pt has been instructed and meds reviewed 

## 2010-11-24 NOTE — Telephone Encounter (Signed)
Pt has been scheduled for EUS 12/15/10

## 2010-11-28 ENCOUNTER — Telehealth: Payer: Self-pay | Admitting: Gastroenterology

## 2010-11-28 NOTE — Telephone Encounter (Signed)
Left message with family member to return call if she still has questions

## 2010-11-30 NOTE — Telephone Encounter (Signed)
Pt had question regarding her BP med I advised her to take her BP med before 10 am she thanked me for helping and will call with further questions  or  concerns

## 2010-12-08 ENCOUNTER — Other Ambulatory Visit: Payer: Self-pay | Admitting: Interventional Radiology

## 2010-12-08 DIAGNOSIS — I83812 Varicose veins of left lower extremities with pain: Secondary | ICD-10-CM

## 2010-12-14 ENCOUNTER — Encounter: Payer: Self-pay | Admitting: Gastroenterology

## 2010-12-15 ENCOUNTER — Ambulatory Visit (HOSPITAL_COMMUNITY)
Admission: RE | Admit: 2010-12-15 | Discharge: 2010-12-15 | Disposition: A | Discharge status: Home / Self Care | Payer: Medicare Other | Source: Ambulatory Visit | Attending: Gastroenterology | Admitting: Gastroenterology

## 2010-12-15 ENCOUNTER — Encounter (HOSPITAL_COMMUNITY)
Admission: RE | Disposition: A | Discharge status: Home / Self Care | Payer: Self-pay | Source: Ambulatory Visit | Attending: Gastroenterology

## 2010-12-15 ENCOUNTER — Encounter (HOSPITAL_COMMUNITY): Payer: Self-pay

## 2010-12-15 DIAGNOSIS — R933 Abnormal findings on diagnostic imaging of other parts of digestive tract: Secondary | ICD-10-CM

## 2010-12-15 DIAGNOSIS — I10 Essential (primary) hypertension: Secondary | ICD-10-CM | POA: Insufficient documentation

## 2010-12-15 DIAGNOSIS — I471 Supraventricular tachycardia, unspecified: Secondary | ICD-10-CM | POA: Insufficient documentation

## 2010-12-15 DIAGNOSIS — D3A092 Benign carcinoid tumor of the stomach: Secondary | ICD-10-CM | POA: Insufficient documentation

## 2010-12-15 DIAGNOSIS — E7800 Pure hypercholesterolemia, unspecified: Secondary | ICD-10-CM | POA: Insufficient documentation

## 2010-12-15 DIAGNOSIS — D131 Benign neoplasm of stomach: Secondary | ICD-10-CM | POA: Insufficient documentation

## 2010-12-15 DIAGNOSIS — E119 Type 2 diabetes mellitus without complications: Secondary | ICD-10-CM | POA: Insufficient documentation

## 2010-12-15 DIAGNOSIS — K219 Gastro-esophageal reflux disease without esophagitis: Secondary | ICD-10-CM | POA: Insufficient documentation

## 2010-12-15 DIAGNOSIS — Z8673 Personal history of transient ischemic attack (TIA), and cerebral infarction without residual deficits: Secondary | ICD-10-CM | POA: Insufficient documentation

## 2010-12-15 DIAGNOSIS — D509 Iron deficiency anemia, unspecified: Secondary | ICD-10-CM | POA: Insufficient documentation

## 2010-12-15 HISTORY — PX: EUS: SHX5427

## 2010-12-15 SURGERY — UPPER ENDOSCOPIC ULTRASOUND (EUS) LINEAR
Anesthesia: Moderate Sedation

## 2010-12-15 MED ORDER — BUTAMBEN-TETRACAINE-BENZOCAINE 2-2-14 % EX AERO
INHALATION_SPRAY | CUTANEOUS | Status: DC | PRN
  Administered 2010-12-15: 2 via TOPICAL

## 2010-12-15 MED ORDER — DIPHENHYDRAMINE HCL 50 MG/ML IJ SOLN
INTRAMUSCULAR | Status: AC
  Filled 2010-12-15: qty 1

## 2010-12-15 MED ORDER — MIDAZOLAM HCL 10 MG/2ML IJ SOLN
INTRAMUSCULAR | Status: AC
  Filled 2010-12-15: qty 4

## 2010-12-15 MED ORDER — FENTANYL CITRATE 0.05 MG/ML IJ SOLN
INTRAMUSCULAR | Status: AC
  Filled 2010-12-15: qty 4

## 2010-12-15 MED ORDER — FENTANYL CITRATE 0.05 MG/ML IJ SOLN
INTRAMUSCULAR | Status: DC | PRN
  Administered 2010-12-15 (×4): 25 ug via INTRAVENOUS

## 2010-12-15 MED ORDER — MIDAZOLAM HCL 10 MG/2ML IJ SOLN
INTRAMUSCULAR | Status: DC | PRN
  Administered 2010-12-15 (×4): 2 mg via INTRAVENOUS

## 2010-12-15 MED ORDER — SODIUM CHLORIDE 0.9 % IV SOLN
Freq: Once | INTRAVENOUS | Status: AC
  Administered 2010-12-15 (×2): via INTRAVENOUS

## 2010-12-15 NOTE — H&P (Signed)
  HPI: This is a woman with abdnormal stomach, history of carcinoid    Past Medical History  Diagnosis Date  . Gastroparesis   . Iron deficiency anemia, unspecified   . Pure hypercholesterolemia   . Headache   . Unspecified essential hypertension   . Esophageal reflux   . Type II or unspecified type diabetes mellitus without mention of complication, not stated as uncontrolled   . Arthritis     Spinal Osteoarthritis  . Diverticulosis 08/30/2000    Colonoscopy   . Gastric polyp     Fundic Gland  . Anxiety   . Cataract   . Heart murmur     Echocardiogram 2/11: EF 60-65%, mild LAE, grade 1 diastolic dysfunction, aortic valve sclerosis, mean gradient 9 mm of mercury, PASP 34  . Stroke     tia  . Chest pain     Myoview 1/11: EF 82%, no ischemia.  Marland Kitchen PSVT (paroxysmal supraventricular tachycardia)   . Carcinoid tumor of stomach     Past Surgical History  Procedure Date  . Cholecystectomy 1993  . Tonsillectomy   . Colonoscopy 2002, 2012    diverticulosis  . Esophagogastroduodenoscopy 08/29/2010; 09/15/2010    7/12: Neuroendocrine tumor    Current Facility-Administered Medications  Medication Dose Route Frequency Provider Last Rate Last Dose  . 0.9 %  sodium chloride infusion   Intravenous Once Rob Bunting, MD 20 mL/hr at 12/15/10 1409      Allergies as of 11/24/2010  . (No Known Allergies)    Family History  Problem Relation Age of Onset  . Diabetes Mother   . Stroke Father   . Heart disease Sister   . Heart disease Brother   . Colon cancer Neg Hx     History   Social History  . Marital Status: Married    Spouse Name: N/A    Number of Children: N/A  . Years of Education: N/A   Occupational History  . Retail    Social History Main Topics  . Smoking status: Never Smoker   . Smokeless tobacco: Never Used   Comment: Daily Caffeine Use -1  . Alcohol Use: No  . Drug Use: No  . Sexually Active: Not on file   Other Topics Concern  . Not on file   Social  History Narrative   One caffeine drink daily       Physical Exam: BP 174/88  Pulse 72  Temp(Src) 98.1 F (36.7 C) (Oral)  Resp 21  Ht 5\' 1"  (1.549 m)  Wt 77.111 kg (170 lb)  BMI 32.12 kg/m2  SpO2 97% Constitutional: generally well-appearing Psychiatric: alert and oriented x3 Abdomen: soft, nontender, nondistended, no obvious ascites, no peritoneal signs, normal bowel sounds     Assessment and plan: 70 y.o. female with abnormal stomach  EGD EUS today

## 2010-12-16 ENCOUNTER — Encounter (HOSPITAL_COMMUNITY): Payer: Self-pay

## 2010-12-19 ENCOUNTER — Telehealth: Payer: Self-pay | Admitting: *Deleted

## 2010-12-19 NOTE — Telephone Encounter (Signed)
Pt states that she received generic Diovan from her mail order company when she renew her last refill. Pt wants to know if the generic Diovan will be as effective as brand name Diovan and if not she wants a new rx sent in for brand name only Diovan.

## 2010-12-19 NOTE — Telephone Encounter (Signed)
Error

## 2010-12-19 NOTE — Telephone Encounter (Signed)
Left message with pt's spouse for pt to callback office

## 2010-12-19 NOTE — Telephone Encounter (Signed)
Yes, generic is fine.

## 2010-12-20 NOTE — Telephone Encounter (Signed)
Pt advised of MD recommendation

## 2010-12-30 ENCOUNTER — Encounter (HOSPITAL_COMMUNITY): Payer: Self-pay | Admitting: Gastroenterology

## 2011-01-06 ENCOUNTER — Encounter: Payer: Self-pay | Admitting: Internal Medicine

## 2011-01-06 ENCOUNTER — Ambulatory Visit (INDEPENDENT_AMBULATORY_CARE_PROVIDER_SITE_OTHER): Payer: Medicare Other | Admitting: Internal Medicine

## 2011-01-06 DIAGNOSIS — K219 Gastro-esophageal reflux disease without esophagitis: Secondary | ICD-10-CM

## 2011-01-06 DIAGNOSIS — K3184 Gastroparesis: Secondary | ICD-10-CM

## 2011-01-06 DIAGNOSIS — D509 Iron deficiency anemia, unspecified: Secondary | ICD-10-CM

## 2011-01-06 DIAGNOSIS — D3A092 Benign carcinoid tumor of the stomach: Secondary | ICD-10-CM

## 2011-01-06 DIAGNOSIS — R933 Abnormal findings on diagnostic imaging of other parts of digestive tract: Secondary | ICD-10-CM

## 2011-01-06 MED ORDER — ESOMEPRAZOLE MAGNESIUM 40 MG PO CPDR: 40.0000 mg | DELAYED_RELEASE_CAPSULE | Freq: Every day | ORAL | Status: DC

## 2011-01-06 NOTE — Patient Instructions (Signed)
You have been given samples of Nexium today. Gastroparesis Diet for delayed stomach emptying given to you to follow, please follow step 3. We have put in a colon recall for April 2013 you will hear from [place] around that time. Return to see Dr. Leone Payor as needed.

## 2011-01-06 NOTE — Assessment & Plan Note (Addendum)
I suspect this was isolated and completely removed. At this point we'll plan for a followup upper endoscopy in April 2013. She was reassured today. There are no systemic symptoms reported. Endoscopic ultrasound in November 2012 was unrevealing as well.

## 2011-01-06 NOTE — Assessment & Plan Note (Addendum)
Step 3 gastroparesis diet will be used and low-dose twice daily metaclopramide - no side effects noticed today. Reviewed risks of neuropathy and tardive dyskinesia.

## 2011-01-06 NOTE — Assessment & Plan Note (Addendum)
Nexium provides relief. Given that she also has gastric parasites, I think it's appropriate to continue this medication for symptom control. Her fundic gland polyps may be related to this. It is possible the small carcinoid was related to acid suppression as well. However, I think the benefits outweigh the risks of this medication at this time. I explained that to her.

## 2011-01-07 ENCOUNTER — Other Ambulatory Visit: Payer: Self-pay | Admitting: Internal Medicine

## 2011-01-07 ENCOUNTER — Encounter: Payer: Self-pay | Admitting: Internal Medicine

## 2011-01-07 NOTE — Assessment & Plan Note (Signed)
I explained to her that we did not find any significant pathology to correlate with this CT finding.

## 2011-01-07 NOTE — Assessment & Plan Note (Signed)
Hemoglobin has been improving on iron. We will call her to have a repeat CBC checked. Will continue iron for now. Presumably she's had a nutritional iron deficiency.

## 2011-01-07 NOTE — Progress Notes (Signed)
Addended by: Iva Boop on: 01/07/2011 10:13 AM   Modules accepted: Orders

## 2011-01-07 NOTE — Progress Notes (Signed)
  Subjective:    [name], female    DOB: 1940-04-13, 70 y.o.   MRN: [id number]  HPI The patient returns to room followup and review findings. She had a very small carcinoid tumor, less than a centimeter removed from her stomach in July. Subsequently she had followup EGD, endoscopic ultrasound. She also had a colonoscopy to evaluate deficiency anemia. We reviewed all that and how I think that the carcinoid tumor was isolated and has been removed.  She still has occasional bloating and discomfort and some heartburn but in general Nexium and metoclopramide as prescribed on her medication list are controlling her symptoms. She does not have symptoms of neuropathy or tardive dyskinesia related to metoclopramide as far as I can tell from talking to her.   Review of Systems She remained somewhat anxious over her husband's frail health.    Objective:   Physical Exam Well-developed elderly middle [place] woman in no acute distress No signs of tardive dyskinesia observed       Assessment & Plan:

## 2011-01-09 ENCOUNTER — Telehealth: Payer: Self-pay | Admitting: Gastroenterology

## 2011-01-09 NOTE — Telephone Encounter (Signed)
Patient informed of Dr. Marvell Fuller orders.

## 2011-01-09 NOTE — Telephone Encounter (Signed)
Message copied by Bernita Buffy on Mon Jan 09, 2011  3:22 PM ------      Message from: Iva Boop      Created: Sat Jan 07, 2011 10:11 AM      Regarding: Needs labs, please call       I placed orders for CBC and ferritin, please n come the week of December 3 to recheck her anemia. I forgot to do this when she was here. We will call her with results.

## 2011-01-13 ENCOUNTER — Other Ambulatory Visit (INDEPENDENT_AMBULATORY_CARE_PROVIDER_SITE_OTHER): Payer: Medicare Other

## 2011-01-13 DIAGNOSIS — D509 Iron deficiency anemia, unspecified: Secondary | ICD-10-CM

## 2011-01-13 LAB — CBC WITH DIFFERENTIAL/PLATELET
Basophils Absolute: 0.1 10*3/uL (ref 0.0–0.1)
Basophils Relative: 0.6 % (ref 0.0–3.0)
Eosinophils Absolute: 0.1 10*3/uL (ref 0.0–0.7)
Lymphocytes Relative: 25.5 % (ref 12.0–46.0)
MCHC: 33.8 g/dL (ref 30.0–36.0)
MCV: 82.8 fl (ref 78.0–100.0)
Monocytes Absolute: 0.7 10*3/uL (ref 0.1–1.0)
Neutrophils Relative %: 65.9 % (ref 43.0–77.0)
Platelets: 250 10*3/uL (ref 150.0–400.0)
RBC: 4.25 Mil/uL (ref 3.87–5.11)
RDW: 13.8 % (ref 11.5–14.6)

## 2011-01-15 NOTE — Progress Notes (Signed)
Quick Note:  Her Hgb is slightly low - almost normal  however iron remains low (ferritin shows this)  She needs to increase her ferrous sulfate to three times a day and follow-up on the anemia through PCP in 2-3 months  Please let her know and cc her PCP with the results and the information above ______

## 2011-01-16 ENCOUNTER — Emergency Department (HOSPITAL_COMMUNITY): Payer: Medicare Other

## 2011-01-16 ENCOUNTER — Telehealth: Payer: Self-pay | Admitting: Internal Medicine

## 2011-01-16 ENCOUNTER — Encounter (HOSPITAL_COMMUNITY): Payer: Self-pay | Admitting: *Deleted

## 2011-01-16 ENCOUNTER — Emergency Department (HOSPITAL_COMMUNITY)
Admission: EM | Admit: 2011-01-16 | Discharge: 2011-01-17 | Disposition: A | Discharge status: Home / Self Care | Payer: Medicare Other | Attending: Emergency Medicine | Admitting: Emergency Medicine

## 2011-01-16 ENCOUNTER — Other Ambulatory Visit: Payer: Self-pay

## 2011-01-16 DIAGNOSIS — M129 Arthropathy, unspecified: Secondary | ICD-10-CM | POA: Insufficient documentation

## 2011-01-16 DIAGNOSIS — R202 Paresthesia of skin: Secondary | ICD-10-CM

## 2011-01-16 DIAGNOSIS — E119 Type 2 diabetes mellitus without complications: Secondary | ICD-10-CM | POA: Insufficient documentation

## 2011-01-16 DIAGNOSIS — Z79899 Other long term (current) drug therapy: Secondary | ICD-10-CM | POA: Insufficient documentation

## 2011-01-16 DIAGNOSIS — R42 Dizziness and giddiness: Secondary | ICD-10-CM | POA: Insufficient documentation

## 2011-01-16 DIAGNOSIS — R209 Unspecified disturbances of skin sensation: Secondary | ICD-10-CM | POA: Insufficient documentation

## 2011-01-16 DIAGNOSIS — Z7982 Long term (current) use of aspirin: Secondary | ICD-10-CM | POA: Insufficient documentation

## 2011-01-16 DIAGNOSIS — I10 Essential (primary) hypertension: Secondary | ICD-10-CM | POA: Insufficient documentation

## 2011-01-16 DIAGNOSIS — E7800 Pure hypercholesterolemia, unspecified: Secondary | ICD-10-CM | POA: Insufficient documentation

## 2011-01-16 DIAGNOSIS — Z8673 Personal history of transient ischemic attack (TIA), and cerebral infarction without residual deficits: Secondary | ICD-10-CM | POA: Insufficient documentation

## 2011-01-16 DIAGNOSIS — G9389 Other specified disorders of brain: Secondary | ICD-10-CM | POA: Insufficient documentation

## 2011-01-16 DIAGNOSIS — K219 Gastro-esophageal reflux disease without esophagitis: Secondary | ICD-10-CM | POA: Insufficient documentation

## 2011-01-16 LAB — CBC
Hemoglobin: 12.3 g/dL (ref 12.0–15.0)
MCH: 27.3 pg (ref 26.0–34.0)
RBC: 4.51 MIL/uL (ref 3.87–5.11)

## 2011-01-16 LAB — COMPREHENSIVE METABOLIC PANEL
Alkaline Phosphatase: 81 U/L (ref 39–117)
BUN: 21 mg/dL (ref 6–23)
CO2: 25 mEq/L (ref 19–32)
Chloride: 95 mEq/L — ABNORMAL LOW (ref 96–112)
GFR calc Af Amer: >90 mL/min (ref >90)
Glucose, Bld: 119 mg/dL — ABNORMAL HIGH (ref 70–99)
Potassium: 3.6 mEq/L (ref 3.5–5.1)
Total Bilirubin: 0.4 mg/dL (ref 0.3–1.2)

## 2011-01-16 LAB — CK TOTAL AND CKMB (NOT AT ARMC): Total CK: 131 U/L (ref 7–177)

## 2011-01-16 LAB — POCT I-STAT, CHEM 8
BUN: 24 mg/dL — ABNORMAL HIGH (ref 6–23)
Calcium, Ion: 1.16 mmol/L (ref 1.12–1.32)
Chloride: 97 mEq/L (ref 96–112)
Creatinine, Ser: 0.7 mg/dL (ref 0.50–1.10)
TCO2: 27 mmol/L (ref 0–100)

## 2011-01-16 LAB — DIFFERENTIAL
Lymphocytes Relative: 43 % (ref 12–46)
Lymphs Abs: 4.8 10*3/uL — ABNORMAL HIGH (ref 0.7–4.0)
Monocytes Relative: 10 % (ref 3–12)
Neutro Abs: 5.1 10*3/uL (ref 1.7–7.7)
Neutrophils Relative %: 45 % (ref 43–77)

## 2011-01-16 LAB — PROTIME-INR: INR: 0.88 (ref 0.00–1.49)

## 2011-01-16 LAB — APTT: aPTT: 27 seconds (ref 24–37)

## 2011-01-16 MED ORDER — SODIUM CHLORIDE 0.9 % IV SOLN
INTRAVENOUS | Status: DC
  Administered 2011-01-16: 23:00:00 via INTRAVENOUS

## 2011-01-16 MED ORDER — LORAZEPAM 2 MG/ML IJ SOLN
1.0000 mg | Freq: Once | INTRAMUSCULAR | Status: AC
  Administered 2011-01-16: 1 mg via INTRAVENOUS
  Filled 2011-01-16: qty 1

## 2011-01-16 MED ORDER — LORAZEPAM 2 MG/ML IJ SOLN
INTRAMUSCULAR | Status: AC
  Filled 2011-01-16: qty 1

## 2011-01-16 MED ORDER — LORAZEPAM 2 MG/ML IJ SOLN
1.0000 mg | Freq: Once | INTRAMUSCULAR | Status: AC
  Administered 2011-01-16: 1 mg via INTRAVENOUS

## 2011-01-16 NOTE — ED Notes (Signed)
CBG by EMS was 121

## 2011-01-16 NOTE — ED Notes (Signed)
Patient back from MRI.

## 2011-01-16 NOTE — Telephone Encounter (Signed)
Patient advised of the results and to increase her iron as ordered.

## 2011-01-16 NOTE — ED Notes (Signed)
Per EMS:  Pt c/o numbness in left side x 2 days, also c/o tingling in left arm and face since 1700 tonight.  No slurred speech, no drift/weakness.  Gait normal, able to walk.

## 2011-01-16 NOTE — ED Provider Notes (Signed)
History     CSN: [id number] Arrival date & time: 01/16/2011  8:48 PM   First MD Initiated Contact with Patient 01/16/11 2103      Chief Complaint  Patient presents with  . Code Stroke    (Consider location/radiation/quality/duration/timing/severity/associated sxs/prior treatment) HPI Pt developed numbness in her left arm.  That has been ongoing for 4 days.  Today she felt a pinching in her left arm and left face.  No trouble with speech, balance or coordination although she did feel a little dizzy.  Pt  Has been walking without trouble.  Patient was initially brought in as a code stroke. The stroke nurse evaluated the patient and discussed the case with Dr. Pearlean Brownie.  The code stroke was canceled by him as her symptoms have been going on for several days.  No chest pain or headache. Pt has not noticed anything that makes it better or worse.   Past Medical History  Diagnosis Date  . Gastroparesis   . Iron deficiency anemia, unspecified   . Pure hypercholesterolemia   . Headache   . Unspecified essential hypertension   . Esophageal reflux   . Type II or unspecified type diabetes mellitus without mention of complication, not stated as uncontrolled   . Arthritis     Spinal Osteoarthritis  . Diverticulosis 08/30/2000    Colonoscopy   . Gastric polyp     Fundic Gland  . Anxiety   . Cataract   . Heart murmur     Echocardiogram 2/11: EF 60-65%, mild LAE, grade 1 diastolic dysfunction, aortic valve sclerosis, mean gradient 9 mm of mercury, PASP 34  . Stroke     tia  . Chest pain     Myoview 1/11: EF 82%, no ischemia.  Marland Kitchen PSVT (paroxysmal supraventricular tachycardia)   . Carcinoid tumor of stomach     Past Surgical History  Procedure Date  . Cholecystectomy 1993  . Tonsillectomy   . Colonoscopy 11/11/2010    diverticulosis  . Esophagogastroduodenoscopy 08/29/2010; 09/15/2010    Carcinoid tumor less than 1 cm in July 2012 not seen in August 2012 , gastritis, fundic gland polyps  .  Eus 12/15/2010    Procedure: UPPER ENDOSCOPIC ULTRASOUND (EUS) LINEAR;  Surgeon: Rob Bunting, MD;  Location: [facility];  Service: Endoscopy;  Laterality: N/A;    Family History  Problem Relation Age of Onset  . Diabetes Mother   . Stroke Father     deceased age 75  . Heart disease Sister     deceased MI age 53  . Heart disease Brother     deceased MI age 55  . Colon cancer Neg Hx     History  Substance Use Topics  . Smoking status: Never Smoker   . Smokeless tobacco: Never Used   Comment: Daily Caffeine Use -1  . Alcohol Use: No    OB History    Grav Para Term Preterm Abortions TAB SAB Ect Mult Living                  Review of Systems  All other systems reviewed and are negative.    Allergies  Review of patient's allergies indicates no known allergies.  Home Medications   Current Outpatient Rx  Name Route Sig Dispense Refill  . ASPIRIN 81 MG PO TABS Oral Take 81 mg by mouth daily.      . ATORVASTATIN CALCIUM 20 MG PO TABS Oral Take 20 mg by mouth daily.      Marland Kitchen  VITAMIN D 1000 UNITS PO TABS Oral Take 1,000 Units by mouth daily.      Marland Kitchen CLINDINIUM-CHLORDIAZEPOXIDE 2.5-5 MG PO CAPS Oral Take 1 capsule by mouth 3 (three) times daily as needed. For pain 60 capsule 1  . ESOMEPRAZOLE MAGNESIUM 40 MG PO CPDR Oral Take 1 capsule (40 mg total) by mouth daily. 15 capsule 0    Samples given to patient    Lot#  [id number]          ...  . IRON 28 MG PO TABS Oral Take by mouth daily.      Marland Kitchen FLUTICASONE PROPIONATE 50 MCG/ACT NA SUSP Nasal Place 2 sprays into the nose daily.      . FOLBIC 2.5-25-2 MG PO TABS  TAKE 1 TABLET BY MOUTH EVERY DAY 90 tablet 2    PATIENT IS OUT  . ASCENSIA CONTOUR TEST VI In Vitro 1 each by In Vitro route 4 (four) times daily.      Marland Kitchen METFORMIN HCL ER 500 MG PO TB24 Oral Take 2 tablets (1,000 mg total) by mouth 2 (two) times daily. 360 tablet 3  . METOCLOPRAMIDE HCL 5 MG PO TABS Oral Take 5 mg by mouth 2 (two) times daily. Before breakfast and at bedtime      . METOPROLOL TARTRATE 25 MG PO TABS Oral Take 1 tablet (25 mg total) by mouth 2 (two) times daily. 180 tablet 2  . ONE-DAILY MULTI VITAMINS PO TABS Oral Take 1 tablet by mouth daily.      Marland Kitchen VISION FORMULA PO Oral Take by mouth.      . ONDANSETRON HCL 4 MG PO TABS Oral Take 1 tablet (4 mg total) by mouth every 4 (four) hours as needed. 30 tablet 1  . TRAMADOL HCL 50 MG PO TABS Oral Take 1 tablet (50 mg total) by mouth every 6 (six) hours as needed for pain. 20 tablet 0  . VALSARTAN-HYDROCHLOROTHIAZIDE 320-12.5 MG PO TABS Oral Take 1 tablet by mouth daily.      Marland Kitchen VITAMIN E 100 UNITS PO CAPS Oral Take by mouth daily.        BP 166/82  Temp 98.6 F (37 C)  Resp 16  SpO2 99%  Physical Exam  Nursing note and vitals reviewed. Constitutional: She is oriented to person, place, and time. She appears well-developed and well-nourished. No distress.  HENT:  Head: Normocephalic and atraumatic.  Right Ear: External ear normal.  Left Ear: External ear normal.  Mouth/Throat: Oropharynx is clear and moist.  Eyes: Conjunctivae are normal. Right eye exhibits no discharge. Left eye exhibits no discharge. No scleral icterus.  Neck: Neck supple. No tracheal deviation present.  Cardiovascular: Normal rate, regular rhythm and intact distal pulses.   Pulmonary/Chest: Effort normal and breath sounds normal. No stridor. No respiratory distress. She has no wheezes. She has no rales.  Abdominal: Soft. Bowel sounds are normal. She exhibits no distension. There is no tenderness. There is no rebound and no guarding.  Musculoskeletal: She exhibits no edema and no tenderness.  Neurological: She is alert and oriented to person, place, and time. She has normal strength. No cranial nerve deficit ( no gross defecits noted) or sensory deficit. She exhibits normal muscle tone. She displays no seizure activity. Coordination normal.       No pronator drift bilateral upper extrem, able to hold both legs off bed for 5  seconds, sensation intact in all extremities, no visual field cuts, no left or right sided neglect  Skin:  Skin is warm and dry. No rash noted.  Psychiatric: She has a normal mood and affect.    ED Course  Procedures (including critical care time)  Date: 01/16/2011  Rate: 79  Rhythm: normal sinus rhythm  QRS Axis: normal  Intervals: normal  ST/T Wave abnormalities: normal  Conduction Disutrbances:none  Narrative Interpretation: Early precordial RS transition, consider anteroseptal infarct  Old EKG Reviewed: unchanged    Labs Reviewed  I-STAT, CHEM 8  PROTIME-INR  APTT  CBC  DIFFERENTIAL  COMPREHENSIVE METABOLIC PANEL  CK TOTAL AND CKMB  TROPONIN I  POCT CBG MONITORING   Ct Head Wo Contrast  01/16/2011  *RADIOLOGY REPORT*  Clinical Data: Code stroke.  Left-sided facial neck and arm numbness.  CT HEAD WITHOUT CONTRAST  Technique:  Contiguous axial images were obtained from the base of the skull through the vertex without contrast.  Comparison: None.  Findings: Diffuse cerebral atrophy.  Normal sized ventricles. Negative for hemorrhage, mass effect, mass lesion, or evidence of acute cortically based infarction.  The visualized paranasal sinuses, mastoid air cells, and middle ears are clear.  Nasal septal deviation to the left.  The skull is intact.  IMPRESSION: 1.  No acute intracranial abnormality identified.   2. Cerebral atrophy.  Original Report Authenticated By: Britta Mccreedy, M.D.     Diagnosis: Paresthesia   MDM  10:24 PM patient was given 1 mg of Ativan to facilitate the MRI as she is claustrophobic.  This was ineffective and patient required another dose.  The preliminary reading of the MRI by the radiologist is not showing any evidence of stroke or vascular stenosis. There is mention of possible pseudotumor cerebri versus normal variant. Patient is not having any difficulty with visual symptoms or headache. I suspect that this is more of a normal variant. The patient's  symptoms have been ongoing for days. This is not consistent with a TIA. As there is no evidence of stroke on MRI, she may have some radiculopathy. At this point the patient is stable for discharge as there does not appear to be any evidence of an acute emergency medical condition. I recommend she followup with her primary care Dr. if the symptoms persist       Celene Kras, MD 01/16/11 2350

## 2011-01-19 ENCOUNTER — Ambulatory Visit (INDEPENDENT_AMBULATORY_CARE_PROVIDER_SITE_OTHER): Payer: Medicare Other | Admitting: Endocrinology

## 2011-01-19 ENCOUNTER — Encounter: Payer: Self-pay | Admitting: Endocrinology

## 2011-01-19 VITALS — BP 136/70 | HR 77 | Temp 98.1°F | Ht 64.0 in | Wt 175.8 lb

## 2011-01-19 DIAGNOSIS — J069 Acute upper respiratory infection, unspecified: Secondary | ICD-10-CM

## 2011-01-19 DIAGNOSIS — R9089 Other abnormal findings on diagnostic imaging of central nervous system: Secondary | ICD-10-CM | POA: Insufficient documentation

## 2011-01-19 MED ORDER — POTASSIUM CHLORIDE CRYS ER 10 MEQ PO TBCR: 10.0000 meq | EXTENDED_RELEASE_TABLET | Freq: Two times a day (BID) | ORAL | Status: DC

## 2011-01-19 MED ORDER — LEVOFLOXACIN 250 MG PO TABS: 250.0000 mg | ORAL_TABLET | Freq: Every day | ORAL | Status: DC

## 2011-01-19 MED ORDER — LEVOFLOXACIN 250 MG PO TABS: 250.0000 mg | ORAL_TABLET | Freq: Every day | ORAL | Status: AC

## 2011-01-19 NOTE — Patient Instructions (Addendum)
Loratadine-d (non-prescription) will help your congestion. i have sent 2 prescriptions to your pharmacy:  1 for an antibiotic, and 1 for a potassium pill.

## 2011-01-19 NOTE — Progress Notes (Signed)
Subjective:    [name], female    DOB: [birth year], 70 y.o.   MRN: [id number]  HPI Pt was seen in er 3 days ago for .  She has few days of left ear "blocked."  She has assoc nasal congestion.   Past Medical History  Diagnosis Date  . Gastroparesis   . Iron deficiency anemia, unspecified   . Pure hypercholesterolemia   . Headache   . Unspecified essential hypertension   . Esophageal reflux   . Type II or unspecified type diabetes mellitus without mention of complication, not stated as uncontrolled   . Arthritis     Spinal Osteoarthritis  . Diverticulosis 08/30/2000    Colonoscopy   . Gastric polyp     Fundic Gland  . Anxiety   . Cataract   . Heart murmur     Echocardiogram 2/11: EF 60-65%, mild LAE, grade 1 diastolic dysfunction, aortic valve sclerosis, mean gradient 9 mm of mercury, PASP 34  . Stroke     tia  . Chest pain     Myoview 1/11: EF 82%, no ischemia.  Marland Kitchen PSVT (paroxysmal supraventricular tachycardia)   . Carcinoid tumor of stomach     Past Surgical History  Procedure Date  . Cholecystectomy 1993  . Tonsillectomy   . Colonoscopy 11/11/2010    diverticulosis  . Esophagogastroduodenoscopy 08/29/2010; 09/15/2010    Carcinoid tumor less than 1 cm in July 2012 not seen in August 2012 , gastritis, fundic gland polyps  . Eus 12/15/2010    Procedure: UPPER ENDOSCOPIC ULTRASOUND (EUS) LINEAR;  Surgeon: Rob Bunting, MD;  Location: [facility];  Service: Endoscopy;  Laterality: N/A;    History   Social History  . Marital Status: Married    Spouse Name: N/A    Number of Children: 0  . Years of Education: N/A   Occupational History  . Retail    Social History Main Topics  . Smoking status: Never Smoker   . Smokeless tobacco: Never Used   Comment: Daily Caffeine Use -1  . Alcohol Use: No  . Drug Use: No  . Sexually Active: Not on file   Other Topics Concern  . Not on file   Social History Narrative   One caffeine drink daily      Current Outpatient Prescriptions on File Prior to Visit  Medication Sig Dispense Refill  . aspirin 81 MG tablet Take 81 mg by mouth daily.        Marland Kitchen atorvastatin (LIPITOR) 20 MG tablet Take 20 mg by mouth at bedtime.       . cholecalciferol (VITAMIN D) 1000 UNITS tablet Take 1,000 Units by mouth daily.        . clidinium-chlordiazePOXIDE (LIBRAX) 2.5-5 MG per capsule Take 1 capsule by mouth 3 (three) times daily as needed. For pain  60 capsule  1  . esomeprazole (NEXIUM) 40 MG capsule Take 1 capsule (40 mg total) by mouth daily.  15 capsule  0  . Ferrous Sulfate (IRON) 28 MG TABS Take by mouth daily.        . fluticasone (FLONASE) 50 MCG/ACT nasal spray Place 2 sprays into the nose daily.        . FOLBIC 2.5-25-2 MG TABS TAKE 1 TABLET BY MOUTH EVERY DAY  90 tablet  2  . metFORMIN (GLUCOPHAGE-XR) 500 MG 24 hr tablet Take 2 tablets (1,000 mg total) by mouth 2 (two) times daily.  360 tablet  3  . metoprolol tartrate (LOPRESSOR)  25 MG tablet Take 1 tablet (25 mg total) by mouth 2 (two) times daily.  180 tablet  2  . Multiple Vitamin (MULTIVITAMIN) tablet Take 1 tablet by mouth daily.        . Multiple Vitamins-Minerals (VISION FORMULA PO) Take by mouth.        . ondansetron (ZOFRAN) 4 MG tablet Take 4 mg by mouth every 4 (four) hours as needed. For nausea        . traMADol (ULTRAM) 50 MG tablet Take 1 tablet (50 mg total) by mouth every 6 (six) hours as needed for pain.  20 tablet  0  . valsartan-hydrochlorothiazide (DIOVAN-HCT) 320-12.5 MG per tablet Take 1 tablet by mouth daily.        . vitamin E 100 UNIT capsule Take by mouth daily.          No Known Allergies  Family History  Problem Relation Age of Onset  . Diabetes Mother   . Stroke Father     deceased age 41  . Heart disease Sister     deceased MI age 86  . Heart disease Brother     deceased MI age 62  . Colon cancer Neg Hx     BP 136/70  Pulse 77  Temp(Src) 98.1 F (36.7 C) (Oral)  Ht 5\' 4"  (1.626 m)  Wt 175 lb 12.8  oz (79.742 kg)  BMI 30.18 kg/m2  SpO2 97%  Review of Systems She still has acral tingling.  No fever.      Objective:   Physical Exam VITAL SIGNS:  See vs page GENERAL: no distress Both tm's have fluid, and slight erythema.    (i reviewed mri result, and labs form er)    Assessment & Plan:  URI, persistent

## 2011-01-20 ENCOUNTER — Encounter: Payer: Self-pay | Admitting: Neurology

## 2011-01-23 ENCOUNTER — Telehealth: Payer: Self-pay

## 2011-01-23 MED ORDER — POTASSIUM CHLORIDE CRYS ER 10 MEQ PO TBCR: 10.0000 meq | EXTENDED_RELEASE_TABLET | Freq: Two times a day (BID) | ORAL | Status: DC

## 2011-01-23 NOTE — Telephone Encounter (Signed)
Pt called requesting Rx refill.

## 2011-02-03 ENCOUNTER — Telehealth: Payer: Self-pay | Admitting: *Deleted

## 2011-02-03 ENCOUNTER — Other Ambulatory Visit (INDEPENDENT_AMBULATORY_CARE_PROVIDER_SITE_OTHER): Payer: Medicare Other

## 2011-02-03 DIAGNOSIS — E119 Type 2 diabetes mellitus without complications: Secondary | ICD-10-CM

## 2011-02-03 DIAGNOSIS — D509 Iron deficiency anemia, unspecified: Secondary | ICD-10-CM

## 2011-02-03 DIAGNOSIS — M531 Cervicobrachial syndrome: Secondary | ICD-10-CM

## 2011-02-03 DIAGNOSIS — I10 Essential (primary) hypertension: Secondary | ICD-10-CM

## 2011-02-03 DIAGNOSIS — G459 Transient cerebral ischemic attack, unspecified: Secondary | ICD-10-CM

## 2011-02-03 DIAGNOSIS — Z79899 Other long term (current) drug therapy: Secondary | ICD-10-CM

## 2011-02-03 DIAGNOSIS — E785 Hyperlipidemia, unspecified: Secondary | ICD-10-CM

## 2011-02-03 DIAGNOSIS — M5481 Occipital neuralgia: Secondary | ICD-10-CM

## 2011-02-03 LAB — LIPID PANEL: VLDL: 20.8 mg/dL (ref 0.0–40.0)

## 2011-02-03 NOTE — Telephone Encounter (Signed)
Pt brought in orders from Dr. Imagene Gurney office from [facility] to have drawn. Lab orders placed into Epic and will be faxed to Dr. Imagene Gurney office once resulted.

## 2011-02-06 ENCOUNTER — Other Ambulatory Visit: Payer: Self-pay | Admitting: *Deleted

## 2011-02-06 DIAGNOSIS — R279 Unspecified lack of coordination: Secondary | ICD-10-CM

## 2011-02-07 ENCOUNTER — Other Ambulatory Visit: Payer: Self-pay | Admitting: Endocrinology

## 2011-02-07 MED ORDER — LEVOTHYROXINE SODIUM 100 MCG PO TABS: 100.0000 ug | ORAL_TABLET | Freq: Every day | ORAL | Status: DC

## 2011-02-08 ENCOUNTER — Other Ambulatory Visit: Payer: Self-pay | Admitting: *Deleted

## 2011-02-08 DIAGNOSIS — R279 Unspecified lack of coordination: Secondary | ICD-10-CM

## 2011-02-10 ENCOUNTER — Ambulatory Visit (INDEPENDENT_AMBULATORY_CARE_PROVIDER_SITE_OTHER): Payer: Medicare Other | Admitting: Endocrinology

## 2011-02-10 ENCOUNTER — Encounter: Payer: Self-pay | Admitting: Endocrinology

## 2011-02-10 VITALS — BP 124/68 | HR 71 | Temp 98.6°F | Ht 62.0 in | Wt 174.0 lb

## 2011-02-10 DIAGNOSIS — G459 Transient cerebral ischemic attack, unspecified: Secondary | ICD-10-CM

## 2011-02-10 DIAGNOSIS — I10 Essential (primary) hypertension: Secondary | ICD-10-CM

## 2011-02-10 DIAGNOSIS — E7800 Pure hypercholesterolemia, unspecified: Secondary | ICD-10-CM | POA: Diagnosis not present

## 2011-02-10 NOTE — Patient Instructions (Addendum)
Here are some samples of "crestor" 10 mg.  Take 1 daily. Go to lab in 1 month to recheck cholesterol.  Then please call [phone] to hear your test results.  You will be prompted to enter the 9-digit "MRN" number that appears at the top left of this page, followed by #.  Then you will hear the message. Please continue the same blood pressure medication.  Have removed "levothyroxine" from your medication list.

## 2011-02-10 NOTE — Progress Notes (Signed)
Subjective:    [name], female    DOB: 1940/11/13, 71 y.o.   MRN: [id number]  HPI Pt takes and tolertates lipitor well.   bp was slightly high at dr love's office.  Pt says she has never had a thyroid problem, but synthroid is on med list.  She says she does not take this.   Past Medical History  Diagnosis Date  . Gastroparesis   . Iron deficiency anemia, unspecified   . Pure hypercholesterolemia   . Headache   . Unspecified essential hypertension   . Esophageal reflux   . Type II or unspecified type diabetes mellitus without mention of complication, not stated as uncontrolled   . Arthritis     Spinal Osteoarthritis  . Diverticulosis 08/30/2000    Colonoscopy   . Gastric polyp     Fundic Gland  . Anxiety   . Cataract   . Heart murmur     Echocardiogram 2/11: EF 60-65%, mild LAE, grade 1 diastolic dysfunction, aortic valve sclerosis, mean gradient 9 mm of mercury, PASP 34  . Stroke     tia  . Chest pain     Myoview 1/11: EF 82%, no ischemia.  Marland Kitchen PSVT (paroxysmal supraventricular tachycardia)   . Carcinoid tumor of stomach     Past Surgical History  Procedure Date  . Cholecystectomy 1993  . Tonsillectomy   . Colonoscopy 11/11/2010    diverticulosis  . Esophagogastroduodenoscopy 08/29/2010; 09/15/2010    Carcinoid tumor less than 1 cm in July 2012 not seen in August 2012 , gastritis, fundic gland polyps  . Eus 12/15/2010    Procedure: UPPER ENDOSCOPIC ULTRASOUND (EUS) LINEAR;  Surgeon: Rob Bunting, MD;  Location: [facility];  Service: Endoscopy;  Laterality: N/A;    History   Social History  . Marital Status: Married    Spouse Name: N/A    Number of Children: 0  . Years of Education: N/A   Occupational History  . Retail    Social History Main Topics  . Smoking status: Never Smoker   . Smokeless tobacco: Never Used   Comment: Daily Caffeine Use -1  . Alcohol Use: No  . Drug Use: No  . Sexually Active: Not on file   Other Topics  Concern  . Not on file   Social History Narrative   One caffeine drink daily     Current Outpatient Prescriptions on File Prior to Visit  Medication Sig Dispense Refill  . aspirin 81 MG tablet Take 81 mg by mouth daily.        . cholecalciferol (VITAMIN D) 1000 UNITS tablet Take 1,000 Units by mouth daily.        . clidinium-chlordiazePOXIDE (LIBRAX) 2.5-5 MG per capsule Take 1 capsule by mouth 3 (three) times daily as needed. For pain  60 capsule  1  . esomeprazole (NEXIUM) 40 MG capsule Take 1 capsule (40 mg total) by mouth daily.  15 capsule  0  . Ferrous Sulfate (IRON) 28 MG TABS Take by mouth daily.        . fluticasone (FLONASE) 50 MCG/ACT nasal spray Place 2 sprays into the nose daily.        . FOLBIC 2.5-25-2 MG TABS TAKE 1 TABLET BY MOUTH EVERY DAY  90 tablet  2  . metFORMIN (GLUCOPHAGE-XR) 500 MG 24 hr tablet Take 2 tablets (1,000 mg total) by mouth 2 (two) times daily.  360 tablet  3  . metoprolol tartrate (LOPRESSOR) 25 MG tablet Take  1 tablet (25 mg total) by mouth 2 (two) times daily.  180 tablet  2  . Multiple Vitamin (MULTIVITAMIN) tablet Take 1 tablet by mouth daily.        . Multiple Vitamins-Minerals (VISION FORMULA PO) Take by mouth.        . ondansetron (ZOFRAN) 4 MG tablet Take 4 mg by mouth every 4 (four) hours as needed. For nausea        . potassium chloride (K-DUR,KLOR-CON) 10 MEQ tablet Take 1 tablet (10 mEq total) by mouth 2 (two) times daily.  180 tablet  3  . traMADol (ULTRAM) 50 MG tablet Take 1 tablet (50 mg total) by mouth every 6 (six) hours as needed for pain.  20 tablet  0  . valsartan-hydrochlorothiazide (DIOVAN-HCT) 320-12.5 MG per tablet Take 1 tablet by mouth daily.        . vitamin E 100 UNIT capsule Take by mouth daily.          No Known Allergies  Family History  Problem Relation Age of Onset  . Diabetes Mother   . Stroke Father     deceased age 67  . Heart disease Sister     deceased MI age 38  . Heart disease Brother     deceased MI  age 71  . Colon cancer Neg Hx    BP 124/68  Pulse 71  Temp(Src) 98.6 F (37 C) (Oral)  Ht 5\' 2"  (1.575 m)  Wt 174 lb (78.926 kg)  BMI 31.83 kg/m2  SpO2 97%  Review of Systems Denies headache    Objective:   Physical Exam VITAL SIGNS:  See vs page GENERAL: no distress PSYCH: Alert and oriented x 3.  Does not appear anxious nor depressed.   Lab Results  Component Value Date   CHOL 185 02/03/2011   HDL 61.50 02/03/2011   LDLCALC 103* 02/03/2011   LDLDIRECT 195.3 05/11/2006   TRIG 104.0 02/03/2011   CHOLHDL 3 02/03/2011       Assessment & Plan:  Dyslipidemia, needs increased rx HTN, well-controlled

## 2011-02-15 ENCOUNTER — Telehealth: Payer: Self-pay

## 2011-02-15 NOTE — Telephone Encounter (Signed)
Pt called stating that at last OV she was told that one of her medications was for thyroid. Pt is upset that she was not inform of a need to take medication for her thyroid. I asked pt the name of medication in question but she was not able to recall but insists that this was discussed at time of last office visit.

## 2011-02-16 DIAGNOSIS — I10 Essential (primary) hypertension: Secondary | ICD-10-CM | POA: Diagnosis not present

## 2011-02-16 DIAGNOSIS — E119 Type 2 diabetes mellitus without complications: Secondary | ICD-10-CM | POA: Diagnosis not present

## 2011-02-16 DIAGNOSIS — G459 Transient cerebral ischemic attack, unspecified: Secondary | ICD-10-CM | POA: Diagnosis not present

## 2011-02-16 DIAGNOSIS — R279 Unspecified lack of coordination: Secondary | ICD-10-CM | POA: Diagnosis not present

## 2011-02-16 DIAGNOSIS — M531 Cervicobrachial syndrome: Secondary | ICD-10-CM | POA: Diagnosis not present

## 2011-02-16 NOTE — Telephone Encounter (Signed)
Pt informed medication removed from med list at OV on Friday.

## 2011-02-16 NOTE — Telephone Encounter (Signed)
i took it off med list when she was here

## 2011-02-21 ENCOUNTER — Other Ambulatory Visit: Payer: Self-pay | Admitting: Obstetrics and Gynecology

## 2011-02-21 DIAGNOSIS — Z1231 Encounter for screening mammogram for malignant neoplasm of breast: Secondary | ICD-10-CM

## 2011-02-27 ENCOUNTER — Telehealth: Payer: Self-pay

## 2011-02-27 NOTE — Telephone Encounter (Signed)
Pt called stating that she is experiencing abd pain that she believes is because of Crestor. Pt is requesting MD advisement on this possible side effect?

## 2011-02-27 NOTE — Telephone Encounter (Signed)
Extremely unlikely to be related, but ok to try off crestor on a trial basis

## 2011-02-27 NOTE — Telephone Encounter (Signed)
Pt advised and will stop medication for 1 week, monitor sxs and report back to MD.  Pt understood and agrees.

## 2011-03-03 ENCOUNTER — Ambulatory Visit: Payer: Medicare Other | Admitting: Neurology

## 2011-03-06 ENCOUNTER — Telehealth: Payer: Self-pay

## 2011-03-06 NOTE — Telephone Encounter (Signed)
Called pt at home #, no answer/number busy.

## 2011-03-06 NOTE — Telephone Encounter (Signed)
Because it is usually a coincidence, you should re-try it

## 2011-03-06 NOTE — Telephone Encounter (Signed)
Pt called stating that since holding Crestor she no longer has abd pain. Pt is requesting advisement from MD.

## 2011-03-07 NOTE — Telephone Encounter (Signed)
Pt informed of MD's advisement. Pt verbalized understanding.  

## 2011-03-14 ENCOUNTER — Ambulatory Visit (INDEPENDENT_AMBULATORY_CARE_PROVIDER_SITE_OTHER): Payer: Medicare Other | Admitting: Cardiology

## 2011-03-14 ENCOUNTER — Encounter: Payer: Self-pay | Admitting: Cardiology

## 2011-03-14 DIAGNOSIS — R079 Chest pain, unspecified: Secondary | ICD-10-CM | POA: Diagnosis not present

## 2011-03-14 DIAGNOSIS — I10 Essential (primary) hypertension: Secondary | ICD-10-CM | POA: Diagnosis not present

## 2011-03-14 MED ORDER — VALSARTAN-HYDROCHLOROTHIAZIDE 320-25 MG PO TABS: 1.0000 | ORAL_TABLET | Freq: Every day | ORAL | Status: DC

## 2011-03-14 NOTE — Assessment & Plan Note (Signed)
This will be followed closely.

## 2011-03-14 NOTE — Progress Notes (Signed)
HPI The patient presents for evaluation of SVT.  Since she was last seen she had an appt with Dr. Ladona Ridgel.  They discussed SVT ablation.  She did not want to have this at this time.  She also was in the emergency room for evaluation of numbness on her left side. There was an extensive workup. She was also referred to Dr. Sandria Manly. There is a question of TIAs or occipital neuralgia. It was noted that her blood pressure has been running somewhat high. She's also had issues with her lipids. She's been intolerant of Lipitor with muscle aches. Her Crestor but had some stomach discomfort. This was held for a while and since restarted. This is being followed by Dr. Everardo All.  There has been some chest discomfort. This is sporadic and midsternal. It is not different than pain she had in 2011 she had her last stress perfusion study. She does have fatigue.  She has anxiety and stress related to caring for her ailing husband.  She does not e axercise. a little   No Known Allergies  Current Outpatient Prescriptions  Medication Sig Dispense Refill  . aspirin 81 MG tablet Take 81 mg by mouth daily.        . cholecalciferol (VITAMIN D) 1000 UNITS tablet Take 1,000 Units by mouth daily.        . clidinium-chlordiazePOXIDE (LIBRAX) 2.5-5 MG per capsule Take 1 capsule by mouth 3 (three) times daily as needed. For pain  60 capsule  1  . esomeprazole (NEXIUM) 40 MG capsule Take 1 capsule (40 mg total) by mouth daily.  15 capsule  0  . Ferrous Sulfate (IRON) 28 MG TABS Take by mouth daily.        . fluticasone (FLONASE) 50 MCG/ACT nasal spray Place 2 sprays into the nose daily.        . FOLBIC 2.5-25-2 MG TABS TAKE 1 TABLET BY MOUTH EVERY DAY  90 tablet  2  . metFORMIN (GLUCOPHAGE-XR) 500 MG 24 hr tablet Take 2 tablets (1,000 mg total) by mouth 2 (two) times daily.  360 tablet  3  . metoprolol tartrate (LOPRESSOR) 25 MG tablet Take 1 tablet (25 mg total) by mouth 2 (two) times daily.  180 tablet  2  . Multiple Vitamin  (MULTIVITAMIN) tablet Take 1 tablet by mouth daily.        . Multiple Vitamins-Minerals (VISION FORMULA PO) Take by mouth.        . ondansetron (ZOFRAN) 4 MG tablet Take 4 mg by mouth every 4 (four) hours as needed. For nausea        . potassium chloride (K-DUR,KLOR-CON) 10 MEQ tablet Take 1 tablet (10 mEq total) by mouth 2 (two) times daily.  180 tablet  3  . rosuvastatin (CRESTOR) 10 MG tablet Take 10 mg by mouth daily.        . traMADol (ULTRAM) 50 MG tablet Take 1 tablet (50 mg total) by mouth every 6 (six) hours as needed for pain.  20 tablet  0  . valsartan-hydrochlorothiazide (DIOVAN-HCT) 320-12.5 MG per tablet Take 1 tablet by mouth daily.        . vitamin E 100 UNIT capsule Take by mouth daily.          Past Medical History  Diagnosis Date  . Gastroparesis   . Iron deficiency anemia, unspecified   . Pure hypercholesterolemia   . Headache   . Unspecified essential hypertension   . Esophageal reflux   . Type II  or unspecified type diabetes mellitus without mention of complication, not stated as uncontrolled   . Arthritis     Spinal Osteoarthritis  . Diverticulosis 08/30/2000    Colonoscopy   . Gastric polyp     Fundic Gland  . Anxiety   . Cataract   . Heart murmur     Echocardiogram 2/11: EF 60-65%, mild LAE, grade 1 diastolic dysfunction, aortic valve sclerosis, mean gradient 9 mm of mercury, PASP 34  . Stroke     tia  . Chest pain     Myoview 1/11: EF 82%, no ischemia.  Marland Kitchen PSVT (paroxysmal supraventricular tachycardia)   . Carcinoid tumor of stomach     Past Surgical History  Procedure Date  . Cholecystectomy 1993  . Tonsillectomy   . Colonoscopy 11/11/2010    diverticulosis  . Esophagogastroduodenoscopy 08/29/2010; 09/15/2010    Carcinoid tumor less than 1 cm in July 2012 not seen in August 2012 , gastritis, fundic gland polyps  . Eus 12/15/2010    Procedure: UPPER ENDOSCOPIC ULTRASOUND (EUS) LINEAR;  Surgeon: Rob Bunting, MD;  Location: [facility];  Service:  Endoscopy;  Laterality: N/A;    ROS:  As stated in the HPI and negative for all other systems.  PHYSICAL EXAM BP 160/70  Pulse 74  Ht 5' (1.524 m)  Wt 175 lb (79.379 kg)  BMI 34.18 kg/m2 GENERAL:  Well appearing HEENT:  Pupils equal round and reactive, fundi not visualized, oral mucosa unremarkable NECK:  No jugular venous distention, waveform within normal limits, carotid upstroke brisk and symmetric, no bruits, no thyromegaly LYMPHATICS:  No cervical, inguinal adenopathy LUNGS:  Clear to auscultation bilaterally BACK:  No CVA tenderness CHEST:  Unremarkable HEART:  PMI not displaced or sustained,S1 and S2 within normal limits, no S3, no S4, no clicks, no rubs, slight apical systolic early peaking murmur ABD:  Flat, positive bowel sounds normal in frequency in pitch, no bruits, no rebound, no guarding, no midline pulsatile mass, no hepatomegaly, no splenomegaly EXT:  2 plus pulses throughout, no edema, no cyanosis no clubbing SKIN:  No rashes no nodules NEURO:  Cranial nerves II through XII grossly intact, motor grossly intact throughout PSYCH:  Cognitively intact, oriented to person place and time  ASSESSMENT AND PLAN

## 2011-03-14 NOTE — Assessment & Plan Note (Signed)
This has been evaluated with an echocardiogram in August of last year. No significant valvular abnormalities are found. It's a mildly elevated pulmonary pressures. No further evaluation is warranted.

## 2011-03-14 NOTE — Assessment & Plan Note (Signed)
Her blood pressure is not at target. I will increase her Diovan HCT to 300/25. I do note that she had problems with hyponatremia in the past is why the dose was reduced to 300/12.5 previously. However, I reviewed labs and her sodium has been fine. I will check a basic metabolic profile one week. He will keep a blood pressure diary. Of note we have discussed an exercise regimen and weight loss for added control her blood pressure.

## 2011-03-14 NOTE — Assessment & Plan Note (Signed)
This is atypical and not significantly changed since 2011 when she had a negative stress test. No further cardiovascular testing is indicated at this point.

## 2011-03-14 NOTE — Patient Instructions (Signed)
Please have blood work in 1 week  Please increase your Diovan to 300/25 mg once daily Continue all other medications as listed  Follow up with Dr Antoine Poche in 3 months

## 2011-03-21 ENCOUNTER — Encounter: Payer: Self-pay | Admitting: Internal Medicine

## 2011-03-21 ENCOUNTER — Other Ambulatory Visit: Payer: Medicare Other

## 2011-03-28 ENCOUNTER — Other Ambulatory Visit (INDEPENDENT_AMBULATORY_CARE_PROVIDER_SITE_OTHER): Payer: Medicare Other

## 2011-03-28 DIAGNOSIS — R079 Chest pain, unspecified: Secondary | ICD-10-CM

## 2011-03-28 DIAGNOSIS — I10 Essential (primary) hypertension: Secondary | ICD-10-CM | POA: Diagnosis not present

## 2011-03-28 LAB — BASIC METABOLIC PANEL
CO2: 28 mEq/L (ref 19–32)
GFR: 94.11 mL/min (ref >60.00)
Glucose, Bld: 157 mg/dL — ABNORMAL HIGH (ref 70–99)
Potassium: 4.2 mEq/L (ref 3.5–5.1)
Sodium: 136 mEq/L (ref 135–145)

## 2011-03-30 ENCOUNTER — Telehealth: Payer: Self-pay | Admitting: Cardiology

## 2011-03-30 ENCOUNTER — Ambulatory Visit
Admission: RE | Admit: 2011-03-30 | Discharge: 2011-03-30 | Disposition: A | Discharge status: Home / Self Care | Payer: Medicare Other | Source: Ambulatory Visit | Attending: Obstetrics and Gynecology | Admitting: Obstetrics and Gynecology

## 2011-03-30 DIAGNOSIS — Z1231 Encounter for screening mammogram for malignant neoplasm of breast: Secondary | ICD-10-CM

## 2011-03-30 NOTE — Telephone Encounter (Signed)
New problem:  Patient calling regarding test results.  

## 2011-03-30 NOTE — Telephone Encounter (Signed)
Patient aware of BMET  lab results, she verbalized understanding.

## 2011-04-10 ENCOUNTER — Telehealth: Payer: Self-pay | Admitting: Cardiology

## 2011-04-10 NOTE — Telephone Encounter (Signed)
Reviewed results with pt again who states understanding

## 2011-04-10 NOTE — Telephone Encounter (Signed)
Pt calling for lab results.

## 2011-04-13 ENCOUNTER — Other Ambulatory Visit (INDEPENDENT_AMBULATORY_CARE_PROVIDER_SITE_OTHER): Payer: Medicare Other

## 2011-04-13 DIAGNOSIS — E7800 Pure hypercholesterolemia, unspecified: Secondary | ICD-10-CM | POA: Diagnosis not present

## 2011-04-13 LAB — LIPID PANEL
Cholesterol: 206 mg/dL — ABNORMAL HIGH (ref 0–200)
HDL: 69.9 mg/dL (ref >39.00)
Triglycerides: 101 mg/dL (ref 0.0–149.0)
VLDL: 20.2 mg/dL (ref 0.0–40.0)

## 2011-04-14 ENCOUNTER — Telehealth: Payer: Self-pay

## 2011-04-14 NOTE — Telephone Encounter (Signed)
Pt informed of MD's advisement. Pt wants Liver Function Test checked in 3 months to see if cholesterol medication is affecting her liver.

## 2011-04-14 NOTE — Telephone Encounter (Signed)
F/u ov august.  We'll recheck lipids and a1c then

## 2011-04-14 NOTE — Telephone Encounter (Signed)
Pt informed. She wants to know when she needs to come back to have Lipids checked again.

## 2011-04-14 NOTE — Telephone Encounter (Signed)
Please continue the same 

## 2011-04-14 NOTE — Telephone Encounter (Signed)
Pt informed of MD's advisement. 

## 2011-04-14 NOTE — Telephone Encounter (Signed)
Pt called stating she has already re-started Crestor at 10 mg for the last 5 weeks. Pt is requesting MD advise on lab results, should she wait a little longer for full effects of medication or does her dosage need to be increased, please advise.

## 2011-04-14 NOTE — Telephone Encounter (Signed)
Good news.  Liver probs are so rare that blood tests are no longer recommended.

## 2011-04-14 NOTE — Telephone Encounter (Signed)
Please try increasing to 10 mg.  i think your symptoms were a coincidence

## 2011-04-14 NOTE — Telephone Encounter (Signed)
Pt has been taking 10mg  of Crestor for 5 weeks-please advise

## 2011-04-24 ENCOUNTER — Other Ambulatory Visit: Payer: Self-pay | Admitting: Internal Medicine

## 2011-04-24 ENCOUNTER — Other Ambulatory Visit: Payer: Self-pay

## 2011-04-24 MED ORDER — ESOMEPRAZOLE MAGNESIUM 40 MG PO CPDR: 40.0000 mg | DELAYED_RELEASE_CAPSULE | Freq: Every day | ORAL | Status: DC

## 2011-04-24 NOTE — Telephone Encounter (Signed)
A user error has taken place: encounter opened in error, closed for administrative reasons.

## 2011-04-24 NOTE — Telephone Encounter (Signed)
Pt's request for 3 month's supply of Nexium sent to her mail order pharmacy.

## 2011-05-01 ENCOUNTER — Ambulatory Visit (AMBULATORY_SURGERY_CENTER): Payer: Medicare Other | Admitting: *Deleted

## 2011-05-01 VITALS — Ht 60.0 in | Wt 173.0 lb

## 2011-05-01 DIAGNOSIS — D3A092 Benign carcinoid tumor of the stomach: Secondary | ICD-10-CM

## 2011-05-01 NOTE — Progress Notes (Signed)
Instructed pt to take her Lopressor the morning of procedure.

## 2011-05-08 DIAGNOSIS — H00029 Hordeolum internum unspecified eye, unspecified eyelid: Secondary | ICD-10-CM | POA: Diagnosis not present

## 2011-05-12 ENCOUNTER — Telehealth: Payer: Self-pay | Admitting: Cardiology

## 2011-05-12 NOTE — Telephone Encounter (Signed)
Spoke with pt, she got scared last night because her bp was 190/90. She reports she got dizzy when she stood up.  This am her bp was 187/77, she was scared again and took 4 baby aspirin. Her bp is now down to 156/80. She does not have dizziness now. She was told she could take an extra metoprolol if her bp was elevated as long as her heart rate was above 60. Pt voiced understanding. She reports her bp is running generally high with readings 177/80, 171/89 and a rare 121/74. She does not check it daily. She will bring her bp cuff to have it checked. Will forward for dr hochrein review

## 2011-05-12 NOTE — Telephone Encounter (Signed)
Pt took four baby asa , due to BP being too high, 180/90 this am, feels dizzy,  pls advise [phone]

## 2011-05-14 NOTE — Telephone Encounter (Signed)
I agree with the PRN beta blocker and keep a BP diary.

## 2011-05-16 ENCOUNTER — Ambulatory Visit (AMBULATORY_SURGERY_CENTER): Payer: Medicare Other | Admitting: Internal Medicine

## 2011-05-16 ENCOUNTER — Encounter: Payer: Self-pay | Admitting: Internal Medicine

## 2011-05-16 VITALS — BP 146/91 | HR 72 | Temp 96.5°F | Resp 18 | Ht 60.0 in | Wt 173.0 lb

## 2011-05-16 DIAGNOSIS — D3A092 Benign carcinoid tumor of the stomach: Secondary | ICD-10-CM | POA: Diagnosis not present

## 2011-05-16 DIAGNOSIS — D131 Benign neoplasm of stomach: Secondary | ICD-10-CM

## 2011-05-16 DIAGNOSIS — K317 Polyp of stomach and duodenum: Secondary | ICD-10-CM

## 2011-05-16 HISTORY — PX: ESOPHAGOGASTRODUODENOSCOPY: SHX1529

## 2011-05-16 LAB — GLUCOSE, CAPILLARY: Glucose-Capillary: 117 mg/dL — ABNORMAL HIGH (ref 70–99)

## 2011-05-16 MED ORDER — SODIUM CHLORIDE 0.9 % IV SOLN: 500.0000 mL | INTRAVENOUS | Status: DC

## 2011-05-16 NOTE — Patient Instructions (Addendum)
There were numerous stomach polyps seen again. They are small and none looked like neuroendocrine tumors or  cancer but I took many biopsies to sample and be sure things are ok. I will let you know the results of biopsies and plans. Valerie Boop, MD, FACG   YOU HAD AN ENDOSCOPIC PROCEDURE TODAY AT THE [facility]: Refer to the procedure report that was given to you for any specific questions about what was found during the examination.  If the procedure report does not answer your questions, please call your gastroenterologist to clarify.  If you requested that your care partner not be given the details of your procedure findings, then the procedure report has been included in a sealed envelope for you to review at your convenience later.  YOU SHOULD EXPECT: Some feelings of bloating in the abdomen. Passage of more gas than usual.  Walking can help get rid of the air that was put into your GI tract during the procedure and reduce the bloating. If you had a lower endoscopy (such as a colonoscopy or flexible sigmoidoscopy) you may notice spotting of blood in your stool or on the toilet paper. If you underwent a bowel prep for your procedure, then you may not have a normal bowel movement for a few days.  DIET: Your first meal following the procedure should be a light meal and then it is ok to progress to your normal diet.  A half-sandwich or bowl of soup is an example of a good first meal.  Heavy or fried foods are harder to digest and may make you feel nauseous or bloated.  Likewise meals heavy in dairy and vegetables can cause extra gas to form and this can also increase the bloating.  Drink plenty of fluids but you should avoid alcoholic beverages for 24 hours.  ACTIVITY: Your care partner should take you home directly after the procedure.  You should plan to take it easy, moving slowly for the rest of the day.  You can resume normal activity the day after the procedure however you should  NOT DRIVE or use heavy machinery for 24 hours (because of the sedation medicines used during the test).    SYMPTOMS TO REPORT IMMEDIATELY: A gastroenterologist can be reached at any hour.  During normal business hours, 8:30 AM to 5:00 PM Monday through Friday, call [phone].  After hours and on weekends, please call the GI answering service at [phone] who will take a message and have the physician on call contact you.   Following lower endoscopy (colonoscopy or flexible sigmoidoscopy):  Excessive amounts of blood in the stool  Significant tenderness or worsening of abdominal pains  Swelling of the abdomen that is new, acute  Fever of 100F or higher  Following upper endoscopy (EGD)  Vomiting of blood or coffee ground material  New chest pain or pain under the shoulder blades  Painful or persistently difficult swallowing  New shortness of breath  Fever of 100F or higher  Black, tarry-looking stools  FOLLOW UP: If any biopsies were taken you will be contacted by phone or by letter within the next 1-3 weeks.  Call your gastroenterologist if you have not heard about the biopsies in 3 weeks.  Our staff will call the home number listed on your records the next business day following your procedure to check on you and address any questions or concerns that you may have at that time regarding the information given to you following your  procedure. This is a courtesy call and so if there is no answer at the home number and we have not heard from you through the emergency physician on call, we will assume that you have returned to your regular daily activities without incident.  SIGNATURES/CONFIDENTIALITY: You and/or your care partner have signed paperwork which will be entered into your electronic medical record.  These signatures attest to the fact that that the information above on your After Visit Summary has been reviewed and is understood.  Full responsibility of the confidentiality  of this discharge information lies with you and/or your care-partner.

## 2011-05-16 NOTE — Progress Notes (Signed)
Patient did not experience any of the following events: a burn prior to discharge; a fall within the facility; wrong site/side/patient/procedure/implant event; or a hospital transfer or hospital admission upon discharge from the facility. (G8907) Patient did not have preoperative order for IV antibiotic SSI prophylaxis. (G8918)  

## 2011-05-16 NOTE — Op Note (Signed)
[facility] [address] [place], [place]  [address]  ENDOSCOPY PROCEDURE REPORT  PATIENT:  [name], [name]  MR#:  [id number] BIRTHDATE:  06/06/1940, 71 yrs. 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  The LB [id number] [id number] endoscope was introduced through the mouth and advanced to the second portion of the duodenum, without limitations.  The instrument was slowly withdrawn as the mucosa was fully examined. <<PROCEDUREIMAGES>>  There were multiple (TNTC) polyps identified. in the body and fundus of the stomach. Numerous subcentimeter fleshy polyps seen. Multiple biopsies taken to sample. All polyps seen look like fundic gland polyps.  Otherwise the examination was normal. Retroflexed views revealed Retroflexion exam demonstrated findings as previously described.    The scope was then withdrawn from the patient and the procedure completed.  COMPLICATIONS:  None  ENDOSCOPIC IMPRESSION: 1) Polyps, multiple in the body and fundus of the stomach - multiple biopsies taken 2) Otherwise normal examination  REPEAT EXAM:  In for EGD, pending biopsy results.  Iva Boop, MD, [facility]  CC:  The Patient  n. eSIGNED:   Iva Boop at 05/16/2011 10:27 AM  [place], [place], [phone]

## 2011-05-17 ENCOUNTER — Telehealth: Payer: Self-pay | Admitting: *Deleted

## 2011-05-17 NOTE — Telephone Encounter (Signed)
  Follow up Call-  Call back number 05/16/2011 11/11/2010 09/15/2010 08/29/2010  Post procedure Call Back phone  # [phone]  Permission to leave phone message Yes - - -     Patient questions:  Do you have a fever, pain , or abdominal swelling? no Pain Score  0 *  Have you tolerated food without any problems? yes  Have you been able to return to your normal activities? yes  Do you have any questions about your discharge instructions: Diet   no Medications  no Follow up visit  no  Do you have questions or concerns about your Care? no  Actions: * If pain score is 4 or above: No action needed, pain <4.

## 2011-05-22 ENCOUNTER — Encounter: Payer: Self-pay | Admitting: Internal Medicine

## 2011-05-22 NOTE — Progress Notes (Signed)
Quick Note:  Fundic gland polyps - no neuroendocrine tumors  Repeat EGD about 05/2012 ______

## 2011-05-23 ENCOUNTER — Telehealth: Payer: Self-pay | Admitting: Internal Medicine

## 2011-05-23 DIAGNOSIS — E119 Type 2 diabetes mellitus without complications: Secondary | ICD-10-CM | POA: Diagnosis not present

## 2011-05-23 NOTE — Telephone Encounter (Signed)
Patient advised of results.  She is advised that a letter will be mailed to her also

## 2011-05-25 ENCOUNTER — Other Ambulatory Visit: Payer: Self-pay | Admitting: Endocrinology

## 2011-05-25 DIAGNOSIS — M722 Plantar fascial fibromatosis: Secondary | ICD-10-CM | POA: Diagnosis not present

## 2011-05-25 DIAGNOSIS — E119 Type 2 diabetes mellitus without complications: Secondary | ICD-10-CM | POA: Diagnosis not present

## 2011-05-26 ENCOUNTER — Encounter: Payer: Self-pay | Admitting: Internal Medicine

## 2011-05-26 ENCOUNTER — Other Ambulatory Visit (INDEPENDENT_AMBULATORY_CARE_PROVIDER_SITE_OTHER): Payer: Medicare Other

## 2011-05-26 ENCOUNTER — Ambulatory Visit (INDEPENDENT_AMBULATORY_CARE_PROVIDER_SITE_OTHER): Payer: Medicare Other | Admitting: Internal Medicine

## 2011-05-26 VITALS — BP 110/66 | HR 68 | Ht 60.0 in | Wt 173.6 lb

## 2011-05-26 DIAGNOSIS — D509 Iron deficiency anemia, unspecified: Secondary | ICD-10-CM

## 2011-05-26 DIAGNOSIS — D131 Benign neoplasm of stomach: Secondary | ICD-10-CM | POA: Diagnosis not present

## 2011-05-26 DIAGNOSIS — D3A092 Benign carcinoid tumor of the stomach: Secondary | ICD-10-CM

## 2011-05-26 DIAGNOSIS — K3184 Gastroparesis: Secondary | ICD-10-CM | POA: Diagnosis not present

## 2011-05-26 LAB — CBC WITH DIFFERENTIAL/PLATELET
Basophils Relative: 0.6 % (ref 0.0–3.0)
Eosinophils Absolute: 0.1 10*3/uL (ref 0.0–0.7)
HCT: 35.7 % — ABNORMAL LOW (ref 36.0–46.0)
Hemoglobin: 11.9 g/dL — ABNORMAL LOW (ref 12.0–15.0)
Lymphocytes Relative: 33.9 % (ref 12.0–46.0)
Lymphs Abs: 2.4 10*3/uL (ref 0.7–4.0)
MCHC: 33.4 g/dL (ref 30.0–36.0)
MCV: 81.9 fl (ref 78.0–100.0)
Neutro Abs: 4 10*3/uL (ref 1.4–7.7)
RBC: 4.36 Mil/uL (ref 3.87–5.11)

## 2011-05-26 NOTE — Progress Notes (Signed)
Quick Note:  Hgb slightly low and iron still low Needs to keep taking her iron and regularly (has been skipping)  Have her get a TTG Ab and IgA level to check for celiac (gluten) problems - i told her did not think she had that but reasonable to test for that as I think about it more ______

## 2011-05-26 NOTE — Patient Instructions (Signed)
Your physician has requested that you go to the basement for the following lab work before leaving today:  CBC, Ferritin  

## 2011-05-26 NOTE — Progress Notes (Signed)
[name], female   DOB: 11-30-1940, 71 y.o.   MRN: [id number]   Patient return to discuss the results of her recent endoscopy. She had a neuroendocrine tumor presumed to be 8 carcinoid tumor found an incidental fashion at endoscopy last year. 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 Her most recent EGD in the past one to 2 weeks demonstrated fundic gland polyps but no other pathology and no neuroendocrine tumor seen.  She has some intermittent abdominal pain at times. She changed to white red after reviewing some gastroparesis diet instructions and said she has felt better in that she has not been using her metoclopramide in the last 2 weeks and has had no significant early satiety or vomiting. From time to time she will be very bloated and feel full in her abdomen. There is occasional constipation.  Medications, allergies, past medical history, past surgical history, family history and social history are reviewed and updated in the EMR.   Assessment and plan:  1. Benign neoplasm of stomach   She has fundic gland polyps but no persistence of neuroendocrine (carcinoid) tumors as best we can tell. I reassured her as best I could and we will plan for a surveillance endoscopy in approximately 1 year.   2. Iron deficiency anemia, unspecified   This needs followup with CBC and ferritin she has remained on iron for the most part, ferrous sulfate may be causing some of her abdominal cramps or discomfort.   3. Gastroparesis   Improved, currently off metoclopramide which she has taken for a number of years without difficulty for documented gastroparesis.    We'll call the lab results and otherwise anticipate followup as needed.

## 2011-05-29 ENCOUNTER — Other Ambulatory Visit: Payer: Self-pay

## 2011-05-29 DIAGNOSIS — E538 Deficiency of other specified B group vitamins: Secondary | ICD-10-CM

## 2011-06-06 ENCOUNTER — Other Ambulatory Visit (INDEPENDENT_AMBULATORY_CARE_PROVIDER_SITE_OTHER): Payer: Medicare Other

## 2011-06-06 DIAGNOSIS — E538 Deficiency of other specified B group vitamins: Secondary | ICD-10-CM | POA: Diagnosis not present

## 2011-06-06 LAB — IGA: IgA: 228 mg/dL (ref 68–378)

## 2011-06-08 ENCOUNTER — Other Ambulatory Visit: Payer: Self-pay

## 2011-06-08 ENCOUNTER — Encounter: Payer: Self-pay | Admitting: Internal Medicine

## 2011-06-08 DIAGNOSIS — D649 Anemia, unspecified: Secondary | ICD-10-CM

## 2011-06-08 NOTE — Progress Notes (Signed)
Quick Note:  Let her know test for celiac (gluten allergy) is negative. Stay on iron supplement every day  Do CBC and ferritin in 6 months re: 280.9 ______

## 2011-06-13 ENCOUNTER — Ambulatory Visit: Payer: Medicare Other | Admitting: Cardiology

## 2011-06-15 ENCOUNTER — Encounter: Payer: Self-pay | Admitting: Cardiology

## 2011-06-15 ENCOUNTER — Ambulatory Visit (INDEPENDENT_AMBULATORY_CARE_PROVIDER_SITE_OTHER): Payer: Medicare Other | Admitting: Cardiology

## 2011-06-15 VITALS — BP 180/80 | HR 76 | Ht 60.0 in | Wt 175.8 lb

## 2011-06-15 DIAGNOSIS — I471 Supraventricular tachycardia: Secondary | ICD-10-CM

## 2011-06-15 DIAGNOSIS — I498 Other specified cardiac arrhythmias: Secondary | ICD-10-CM

## 2011-06-15 NOTE — Progress Notes (Signed)
HPI The patient presents for evaluation of hypertension.  She brings a blood pressure diary.  Her AM blood pressures are typically elevated in the 160 - 180 range.  The PM readings are less than 140.  I reviewed these data today.  She has had no further tachypalpitations which was her previous SVT.  She denies chest pressure, neck or arm discomfort. She's having no new shortness of breath, PND or orthopnea. She's had no weight gain and a mild lower extremity edema. He still has considerable stress caring for her husband.  No Known Allergies  Current Outpatient Prescriptions  Medication Sig Dispense Refill  . aspirin 81 MG tablet Take 81 mg by mouth daily.        . cholecalciferol (VITAMIN D) 1000 UNITS tablet Take 1,000 Units by mouth daily.        Marland Kitchen esomeprazole (NEXIUM) 40 MG capsule Take 1 capsule (40 mg total) by mouth daily.  90 capsule  3  . Ferrous Sulfate (IRON) 28 MG TABS Take by mouth daily.        . fluticasone (FLONASE) 50 MCG/ACT nasal spray Place 2 sprays into the nose daily.        . FOLBIC 2.5-25-2 MG TABS TAKE 1 TABLET BY MOUTH EVERY DAY  90 tablet  3  . metFORMIN (GLUCOPHAGE-XR) 500 MG 24 hr tablet Take 2 tablets (1,000 mg total) by mouth 2 (two) times daily.  360 tablet  3  . metoCLOPramide (REGLAN) 5 MG tablet Take 1 tablet by mouth as needed. Takes at least twice a day      . metoprolol tartrate (LOPRESSOR) 25 MG tablet Take 1 tablet (25 mg total) by mouth 2 (two) times daily.  180 tablet  2  . Multiple Vitamin (MULTIVITAMIN) tablet Take 1 tablet by mouth daily.        . Multiple Vitamins-Minerals (VISION FORMULA PO) Take by mouth.        . ondansetron (ZOFRAN) 4 MG tablet Take 4 mg by mouth every 4 (four) hours as needed. For nausea        . potassium chloride (K-DUR,KLOR-CON) 10 MEQ tablet Take 1 tablet (10 mEq total) by mouth 2 (two) times daily.  180 tablet  3  . rosuvastatin (CRESTOR) 10 MG tablet Take 10 mg by mouth daily.        . valsartan-hydrochlorothiazide  (DIOVAN-HCT) 320-25 MG per tablet Take 1 tablet by mouth daily.  90 tablet  11  . vitamin E 100 UNIT capsule Take by mouth daily.          Past Medical History  Diagnosis Date  . Gastroparesis   . Iron deficiency anemia, unspecified   . Pure hypercholesterolemia   . Headache   . Unspecified essential hypertension   . Esophageal reflux   . Type II or unspecified type diabetes mellitus without mention of complication, not stated as uncontrolled   . Arthritis     Spinal Osteoarthritis  . Diverticulosis 08/30/2000    Colonoscopy   . Gastric polyp     Fundic Gland  . Anxiety   . Cataract   . Heart murmur     Echocardiogram 2/11: EF 60-65%, mild LAE, grade 1 diastolic dysfunction, aortic valve sclerosis, mean gradient 9 mm of mercury, PASP 34  . Stroke     tia  . Chest pain     Myoview 1/11: EF 82%, no ischemia.  Marland Kitchen PSVT (paroxysmal supraventricular tachycardia)   . Carcinoid tumor of stomach  Past Surgical History  Procedure Date  . Cholecystectomy 1993  . Tonsillectomy   . Colonoscopy 11/11/2010    diverticulosis  . Esophagogastroduodenoscopy 08/29/2010; 09/15/2010    Carcinoid tumor less than 1 cm in July 2012 not seen in August 2012 , gastritis, fundic gland polyps  . Eus 12/15/2010    Procedure: UPPER ENDOSCOPIC ULTRASOUND (EUS) LINEAR;  Surgeon: Rob Bunting, MD;  Location: [facility];  Service: Endoscopy;  Laterality: N/A;  . Esophagogastroduodenoscopy 05/16/2011    ROS:  As stated in the HPI and negative for all other systems.  PHYSICAL EXAM BP 180/80  Pulse 76  Ht 5' (1.524 m)  Wt 175 lb 12.8 oz (79.742 kg)  BMI 34.33 kg/m2 GENERAL:  Well appearing HEENT:  Pupils equal round and reactive, fundi not visualized, oral mucosa unremarkable NECK:  No jugular venous distention, waveform within normal limits, carotid upstroke brisk and symmetric, no bruits, no thyromegaly LYMPHATICS:  No cervical, inguinal adenopathy LUNGS:  Clear to auscultation bilaterally BACK:  No  CVA tenderness CHEST:  Unremarkable HEART:  PMI not displaced or sustained,S1 and S2 within normal limits, no S3, no S4, no clicks, no rubs, slight apical systolic early peaking murmur ABD:  Flat, positive bowel sounds normal in frequency in pitch, no bruits, no rebound, no guarding, no midline pulsatile mass, no hepatomegaly, no splenomegaly EXT:  2 plus pulses throughout, no edema, no cyanosis no clubbing  EKG:  Sinus rhythm, rate 76, axis within normal limits, intervals within normal limits, no acute ST-T wave changes.  06/15/2011  ASSESSMENT AND PLAN

## 2011-06-15 NOTE — Patient Instructions (Signed)
Please take Valsartan in the evening. Continue all other medications as listed  Please follow up in 6 weeks to follow up your blood pressure

## 2011-06-15 NOTE — Assessment & Plan Note (Signed)
This is atypical and not significantly changed since 2011 when she had a negative stress test. No further cardiovascular testing is indicated at this point.

## 2011-06-15 NOTE — Assessment & Plan Note (Signed)
This is no longer bothering her. He did not want to consider ablation. She will continue the meds as listed and let me know if these return.

## 2011-06-15 NOTE — Assessment & Plan Note (Signed)
We discussed when necessary dosing of her metoprolol for elevated blood pressures. I would also like to switch her ARB dosing  to the PM since her blood pressures are elevated typically in the a.m.  She will continue to keep her blood pressure diary. I have also asked for 5 pounds of weight loss and increased exercise.

## 2011-06-26 DIAGNOSIS — N760 Acute vaginitis: Secondary | ICD-10-CM | POA: Diagnosis not present

## 2011-06-26 DIAGNOSIS — N952 Postmenopausal atrophic vaginitis: Secondary | ICD-10-CM | POA: Diagnosis not present

## 2011-07-06 ENCOUNTER — Other Ambulatory Visit: Payer: Self-pay

## 2011-07-06 MED ORDER — ROSUVASTATIN CALCIUM 10 MG PO TABS: 10.0000 mg | ORAL_TABLET | Freq: Every day | ORAL | Status: DC

## 2011-07-12 DIAGNOSIS — D235 Other benign neoplasm of skin of trunk: Secondary | ICD-10-CM | POA: Diagnosis not present

## 2011-07-12 DIAGNOSIS — L259 Unspecified contact dermatitis, unspecified cause: Secondary | ICD-10-CM | POA: Diagnosis not present

## 2011-07-12 DIAGNOSIS — I029 Rheumatic chorea without heart involvement: Secondary | ICD-10-CM | POA: Diagnosis not present

## 2011-07-12 DIAGNOSIS — Z85828 Personal history of other malignant neoplasm of skin: Secondary | ICD-10-CM | POA: Diagnosis not present

## 2011-07-12 DIAGNOSIS — L820 Inflamed seborrheic keratosis: Secondary | ICD-10-CM | POA: Diagnosis not present

## 2011-07-18 DIAGNOSIS — N952 Postmenopausal atrophic vaginitis: Secondary | ICD-10-CM | POA: Diagnosis not present

## 2011-07-19 ENCOUNTER — Telehealth: Payer: Self-pay | Admitting: Cardiology

## 2011-07-19 NOTE — Telephone Encounter (Signed)
Pt calling re metoprolol

## 2011-07-19 NOTE — Telephone Encounter (Signed)
Pt callingabout her husbands medication and needing samples because [facility] doesn't have any and she only has enough for one of two days.  Explained to wife that this medication is generic and therefore there are no samples.  Will send a Rx to a local pharmacy until [facility] can get the 90 day supply for pt.  Wife is in agreement.

## 2011-07-21 ENCOUNTER — Ambulatory Visit: Payer: Medicare Other | Admitting: Endocrinology

## 2011-07-21 ENCOUNTER — Other Ambulatory Visit: Payer: Self-pay | Admitting: Obstetrics and Gynecology

## 2011-07-21 DIAGNOSIS — D4959 Neoplasm of unspecified behavior of other genitourinary organ: Secondary | ICD-10-CM | POA: Diagnosis not present

## 2011-07-21 DIAGNOSIS — L819 Disorder of pigmentation, unspecified: Secondary | ICD-10-CM | POA: Diagnosis not present

## 2011-07-21 DIAGNOSIS — N760 Acute vaginitis: Secondary | ICD-10-CM | POA: Diagnosis not present

## 2011-07-27 ENCOUNTER — Encounter: Payer: Self-pay | Admitting: Physician Assistant

## 2011-07-27 ENCOUNTER — Ambulatory Visit (INDEPENDENT_AMBULATORY_CARE_PROVIDER_SITE_OTHER): Payer: Medicare Other | Admitting: Physician Assistant

## 2011-07-27 VITALS — BP 130/68 | HR 72 | Ht 62.0 in | Wt 173.0 lb

## 2011-07-27 DIAGNOSIS — I10 Essential (primary) hypertension: Secondary | ICD-10-CM | POA: Diagnosis not present

## 2011-07-27 MED ORDER — VALSARTAN-HYDROCHLOROTHIAZIDE 160-12.5 MG PO TABS: 1.0000 | ORAL_TABLET | Freq: Two times a day (BID) | ORAL | Status: DC

## 2011-07-27 NOTE — Progress Notes (Signed)
[address] [place], [place]  [address] Phone: [phone] Fax:  [phone]  Date:  07/27/2011   Name:  [name]   DOB:  26-Jul-1940   MRN:  [id number]  PCP:  Romero Belling, MD  Primary Cardiologist:  Dr. Rollene Rotunda  Primary Electrophysiologist:  None    History of Present Illness: [name] is a 71 y.o. female who returns for follow up on HTN.  She has a history of DM2, HTN, HL and GERD, gastric polyps by EGD, chest pain.  She aslo has a history of SVT.  She was treated with adenosine in the emergency room in 2011.  Myoview 1/11: EF 82%, no ischemia.   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  Saw Dr. Lewayne Bunting in 10/2010 and was offered RFCA for her SVT.  She deferred proceeding at that time.  Echo 8/12: EF 60-65%, grade 1 diast dysfxn, PASP 31.    Last seen by Dr. Rollene Rotunda 5/9 for f/u on HTN.  There were fluctuations in her BP readings.  Dr. Antoine Poche asked her to take her Valsartan in the evenings and to follow up today.   She brings in a list of her blood pressures over the last several weeks.  Her morning blood pressure is improved.  Her evening blood pressure, after taking her blood pressure medicine is typically lower.  The highest she has attained is 190/90.  This is basically an outlier.  Otherwise her blood pressures have ranged 110/57 up to 158/77.  She denies chest pain, shortness of breath, syncope.  She denies orthopnea PND.  She denies significant pedal edema.  She does note lightheadedness with standing.  Wt Readings from Last 3 Encounters:  07/27/11 173 lb (78.472 kg)  06/15/11 175 lb 12.8 oz (79.742 kg)  05/26/11 173 lb 9.6 oz (78.744 kg)     Potassium  Date/Time Value Range Status  03/28/2011  2:50 PM 4.2  3.5 - 5.1 mEq/L Final     Creatinine, Ser  Date/Time Value Range Status  03/28/2011  2:50 PM 0.7  0.4 - 1.2 mg/dL Final     ALT    Date/Time Value Range Status  01/16/2011  9:04 PM 26  0 - 35 U/L Final     TSH  Date/Time Value Range Status  09/09/2010 12:28 PM 0.40  0.35 - 5.50 uIU/mL Final     Hemoglobin  Date/Time Value Range Status  05/26/2011 12:15 PM 11.9* 12.0 - 15.0 g/dL Final    Past Medical History  Diagnosis Date  . Gastroparesis   . Iron deficiency anemia, unspecified   . Pure hypercholesterolemia   . Headache   . Uncontrolled hypertension as indication for native nephrectomy   . Esophageal reflux   . Type II or unspecified type diabetes mellitus without mention of complication, not stated as uncontrolled   . Arthritis     Spinal Osteoarthritis  . Diverticulosis 08/30/2000    Colonoscopy   . Gastric polyp     Fundic Gland  . Anxiety   . Cataract   . Heart murmur     Echocardiogram 2/11: EF 60-65%, mild LAE, grade 1 diastolic dysfunction, aortic valve sclerosis, mean gradient 9 mm of mercury, PASP 34  . Stroke     tia  . Chest pain     Myoview 1/11: EF 82%, no ischemia.  Marland Kitchen PSVT (paroxysmal supraventricular tachycardia)   .  Carcinoid tumor of stomach     Current Outpatient Prescriptions  Medication Sig Dispense Refill  . aspirin 81 MG tablet Take 81 mg by mouth daily.        . cholecalciferol (VITAMIN D) 1000 UNITS tablet Take 1,000 Units by mouth daily.        Marland Kitchen esomeprazole (NEXIUM) 40 MG capsule Take 1 capsule (40 mg total) by mouth daily.  90 capsule  3  . Ferrous Sulfate (IRON) 28 MG TABS Take by mouth daily.        . fluticasone (FLONASE) 50 MCG/ACT nasal spray Place 2 sprays into the nose daily.        . FOLBIC 2.5-25-2 MG TABS TAKE 1 TABLET BY MOUTH EVERY DAY  90 tablet  3  . metFORMIN (GLUCOPHAGE-XR) 500 MG 24 hr tablet Take 2 tablets (1,000 mg total) by mouth 2 (two) times daily.  360 tablet  3  . metoCLOPramide (REGLAN) 5 MG tablet Take 1 tablet by mouth as needed. Takes at least twice a day      . metoprolol tartrate (LOPRESSOR) 25 MG tablet Take 1 tablet (25 mg total) by  mouth 2 (two) times daily.  180 tablet  2  . Multiple Vitamin (MULTIVITAMIN) tablet Take 1 tablet by mouth daily.        . Multiple Vitamins-Minerals (VISION FORMULA PO) Take by mouth.        . ondansetron (ZOFRAN) 4 MG tablet Take 4 mg by mouth every 4 (four) hours as needed. For nausea        . potassium chloride (K-DUR,KLOR-CON) 10 MEQ tablet Take 1 tablet (10 mEq total) by mouth 2 (two) times daily.  180 tablet  3  . rosuvastatin (CRESTOR) 10 MG tablet Take 1 tablet (10 mg total) by mouth daily.  90 tablet  1  . valsartan-hydrochlorothiazide (DIOVAN-HCT) 320-25 MG per tablet Take 1 tablet by mouth daily.  90 tablet  11  . vitamin E 100 UNIT capsule Take by mouth daily.          Allergies: No Known Allergies  History  Substance Use Topics  . Smoking status: Never Smoker   . Smokeless tobacco: Never Used   Comment: Daily Caffeine Use -1  . Alcohol Use: No     ROS:  Please see the history of present illness.    All other systems reviewed and negative.   PHYSICAL EXAM: VS:  BP 130/68  Pulse 72  Ht 5\' 2"  (1.575 m)  Wt 173 lb (78.472 kg)  BMI 31.64 kg/m2  Filed Vitals:   07/27/11 1419 07/27/11 1420 07/27/11 1421 07/27/11 1423  BP: 134/61 122/71 122/65 130/68  Pulse: 75 74 74 72  Height:      Weight:         Well nourished, well developed, in no acute distress HEENT: normal Neck: no JVD Cardiac:  normal S1, S2; RRR; 2/6 systolic murmur RUSB Lungs:  clear to auscultation bilaterally, no wheezing, rhonchi or rales Abd: soft, nontender, no hepatomegaly Ext: no edema Skin: warm and dry Neuro:  CNs 2-12 intact, no focal abnormalities noted  EKG:  Sinus rhythm, heart rate 76, normal axis, no acute changes   ASSESSMENT AND PLAN:  1.  Hypertension She has orthostatic blood pressure drop.  Initial blood pressure lying which is not noted above is 154/74.  It drops to 122/71 with standing.  No significant heart rate increase.  She is symptomatic with this.  Long discussion  re: options.  I  think her best option is to wear compression stockings.  I will give her a prescription.  I also think she may have better luck with avoiding fluctuations in her blood pressure by changing her Diovan to 160/12.5 mg q.12 hours.  The basic metabolic panel today.  Follow up with either Dr. Antoine Poche or me in 4-6 weeks.  2.  Supraventricular Tachycardia  No apparent recurrence.  3.  Orthostatic Hypotension Med changes as noted. Could consider Florinef or Pyridostigmine.  But at this point, I don't think that is necessary. Check bmet. Recent Hgb checked and stable. We discussed proper hygiene going from lying to standing.   Signed, Tereso Newcomer, PA-C  2:01 PM 07/27/2011

## 2011-07-27 NOTE — Patient Instructions (Addendum)
Your physician has recommended you make the following change in your medication: DECREASE DIOVAN/HCT TO 160/12.5 MG 1 TABLET EVERY 12 HOURS, NEW PRESCRIPTION WAS SENT TO [facility]   YOU HAVE BEEN ASKED TO GET SOME COMPRESSION STOCKINGS 30-40 mmHg THIGH HIGH  Your physician recommends that you return for lab work in: TODAY BMET

## 2011-07-28 ENCOUNTER — Telehealth: Payer: Self-pay | Admitting: *Deleted

## 2011-07-28 ENCOUNTER — Telehealth: Payer: Self-pay | Admitting: Cardiology

## 2011-07-28 DIAGNOSIS — I10 Essential (primary) hypertension: Secondary | ICD-10-CM

## 2011-07-28 LAB — BASIC METABOLIC PANEL
CO2: 25 mEq/L (ref 19–32)
Chloride: 92 mEq/L — ABNORMAL LOW (ref 96–112)
Creatinine, Ser: 0.7 mg/dL (ref 0.4–1.2)
Glucose, Bld: 133 mg/dL — ABNORMAL HIGH (ref 70–99)

## 2011-07-28 NOTE — Telephone Encounter (Signed)
Pt saw [name], [name] yesterday at 2 pm No results back at this time.

## 2011-07-28 NOTE — Telephone Encounter (Signed)
Message copied by Tarri Fuller on Fri Jul 28, 2011  2:23 PM ------      Message from: [place], [place] T      Created: Fri Jul 28, 2011  1:55 PM       Na is a little low       This is not that bad but would recheck it.      Repeat BMET in one week.      Tereso Newcomer, PA-C  1:54 PM 07/28/2011

## 2011-07-28 NOTE — Telephone Encounter (Signed)
pt notified of lab rsults and will come in 09/03/11 for repeat bmet, no changess were made today, pt gave verbal understanding

## 2011-07-28 NOTE — Telephone Encounter (Signed)
New Problem: ° ° ° °Patient called in wanting to know the results of her latest lab work.  Please call back. °

## 2011-07-28 NOTE — Telephone Encounter (Signed)
Danielle Rankin called pt and will call pt back with instructions

## 2011-07-28 NOTE — Telephone Encounter (Signed)
Fu call °Patient returning your call °

## 2011-07-31 ENCOUNTER — Telehealth: Payer: Self-pay | Admitting: Cardiology

## 2011-07-31 DIAGNOSIS — R000 Tachycardia, unspecified: Secondary | ICD-10-CM

## 2011-07-31 MED ORDER — METOPROLOL TARTRATE 25 MG PO TABS: 25.0000 mg | ORAL_TABLET | Freq: Two times a day (BID) | ORAL | Status: DC

## 2011-07-31 NOTE — Telephone Encounter (Signed)
New Problem:    Patient called in needing a refill of her metoprolol tartrate (LOPRESSOR) 25 MG tablet sent [facility] to [facility] [phone]).

## 2011-07-31 NOTE — Telephone Encounter (Signed)
..   Requested Prescriptions   Signed Prescriptions Disp Refills  . metoprolol tartrate (LOPRESSOR) 25 MG tablet 180 tablet 2    Sig: Take 1 tablet (25 mg total) by mouth 2 (two) times daily.    Authorizing Provider: Rollene Rotunda    Ordering User: Christella Hartigan, Taylan Mayhan Judie Petit

## 2011-08-04 ENCOUNTER — Ambulatory Visit (INDEPENDENT_AMBULATORY_CARE_PROVIDER_SITE_OTHER): Payer: Medicare Other | Admitting: *Deleted

## 2011-08-04 DIAGNOSIS — I10 Essential (primary) hypertension: Secondary | ICD-10-CM

## 2011-08-04 DIAGNOSIS — I498 Other specified cardiac arrhythmias: Secondary | ICD-10-CM

## 2011-08-04 DIAGNOSIS — E7800 Pure hypercholesterolemia, unspecified: Secondary | ICD-10-CM

## 2011-08-05 LAB — BASIC METABOLIC PANEL
BUN: 22 mg/dL (ref 6–23)
Chloride: 92 mEq/L — ABNORMAL LOW (ref 96–112)
Glucose, Bld: 128 mg/dL — ABNORMAL HIGH (ref 70–99)
Potassium: 3.9 mEq/L (ref 3.5–5.3)

## 2011-08-07 ENCOUNTER — Telehealth: Payer: Self-pay | Admitting: Cardiology

## 2011-08-07 DIAGNOSIS — Z79899 Other long term (current) drug therapy: Secondary | ICD-10-CM

## 2011-08-07 DIAGNOSIS — IMO0001 Reserved for inherently not codable concepts without codable children: Secondary | ICD-10-CM

## 2011-08-07 NOTE — Telephone Encounter (Signed)
Message copied by Burnell Blanks on Mon Aug 07, 2011 10:23 AM ------      Message from: Rollene Rotunda      Created: Sun Aug 06, 2011  4:30 PM       Given the low Na.  I would like to change to Diovan only stop the HCTZ.  Repeat BMET in two weeks.

## 2011-08-07 NOTE — Telephone Encounter (Signed)
New problem:  Patient calling regarding test results. C/O B/p on yesterday 94/ 49.  Today  140/65 this am.

## 2011-08-07 NOTE — Telephone Encounter (Signed)
Called patient to advise.  Patient stated she has lots of several different Diovan/HCT medications and was just hoping to cut the 320/12.5 mg to 1/2 twice a day.  Explained to patient her sodium had been low for a while but I would discuss with Bing Neighbors. PA.  Discussed and he feels she needs to be off diuretic altogether for now.  Advised patient. Samples of plain Diovan 160 mg #21 lot #[id number] exp 3/14 gathered for patient.  No new Rx called in, patient will call back to see if we have anymore samples.

## 2011-08-07 NOTE — Telephone Encounter (Signed)
Message copied by Burnell Blanks on Mon Aug 07, 2011 10:24 AM ------      Message from: Rollene Rotunda      Created: Sun Aug 06, 2011  4:30 PM       Given the low Na.  I would like to change to Diovan only stop the HCTZ.  Repeat BMET in two weeks.

## 2011-08-11 ENCOUNTER — Telehealth: Payer: Self-pay | Admitting: Cardiology

## 2011-08-11 NOTE — Telephone Encounter (Signed)
Per call her b/p medication was changed and she doesn't think it is working because her b/p was 176/82 this morning and she doesn't feel good

## 2011-08-11 NOTE — Telephone Encounter (Signed)
Pt calling stating we have changed her BP med and she doesn't think it's working--pt saw s weaver 08/07/11 and he put her on plain diovan--no diuretic--spoke with dr hochrein and he states pt needs to stop worrying about BP--give med a chance to work--also advised pt to stay on present med regime for 2 weeks and if she's still having problems with hypertension to call back--pt agrees

## 2011-08-14 ENCOUNTER — Telehealth: Payer: Self-pay | Admitting: Cardiology

## 2011-08-14 ENCOUNTER — Encounter: Payer: Self-pay | Admitting: Internal Medicine

## 2011-08-14 ENCOUNTER — Ambulatory Visit (INDEPENDENT_AMBULATORY_CARE_PROVIDER_SITE_OTHER): Payer: Medicare Other | Admitting: Internal Medicine

## 2011-08-14 VITALS — BP 134/72 | HR 68 | Ht 62.0 in | Wt 176.0 lb

## 2011-08-14 DIAGNOSIS — K3184 Gastroparesis: Secondary | ICD-10-CM | POA: Diagnosis not present

## 2011-08-14 DIAGNOSIS — K589 Irritable bowel syndrome without diarrhea: Secondary | ICD-10-CM

## 2011-08-14 MED ORDER — DICYCLOMINE HCL 10 MG PO CAPS: 10.0000 mg | ORAL_CAPSULE | Freq: Four times a day (QID) | ORAL | Status: DC | PRN

## 2011-08-14 MED ORDER — AMLODIPINE BESYLATE 5 MG PO TABS: 5.0000 mg | ORAL_TABLET | Freq: Every day | ORAL | Status: DC

## 2011-08-14 NOTE — Progress Notes (Signed)
  Subjective:    [name], female    DOB: Nov 25, 1940, 71 y.o.   MRN: [id number]  HPI The patient is a delightful elderly middle [place] woman who returns for followup of abdominal pain. She had a spell of 2-3 days of upper abdominal pain with pressure and cramps. Similar to the past problems. She wonders why she has this. She has not had nausea vomiting problems. She did not have any oral tongue or lips swelling with this. She is better now. In fact no dominant pain for several days. She thinks that when she gets constipated it may be worse but there is no clear obvious trigger.  Medications, allergies, past medical history, past surgical history, family history and social history are reviewed and updated in the EMR.  Review of Systems She's had some difficulty with blood pressure control her medications are being changed, the diuretic portion was removed from the eye Ativan and she is now having amlodipine.    Objective:   Physical Exam Well Developed well-nourished elderly woman in no acute distress Abdomen is soft and nontender       Assessment & Plan:   1. IBS (irritable bowel syndrome)   2. Gastroparesis    I think her main problem is functional GI disturbance with IBS and functional dyspepsia. Her gastroparesis may have been a part of this. We reviewed the need to avoid too much roughage, I'm going to prescribe dicyclomine 10 mg one to 2 every 6 hours as needed. She is 3, I wonder about potential side effects which she was quite distressed by the abdominal pain so her to try this for symptomatic relief. She relates that many years ago she was able to take Librax with good relief. When I reviewed that that was a benzodiazepine and anti-spasmodic she did not want to try that again, concerned that she may become dependent on a benzodiazepine.  I will see her back as needed. She was reassured as best I could. We reviewed previous CT scan fact look at the images to  explain the anatomy of the abdominal cavity and the organs involved. She has had multiple endoscopies, an endoscopic ultrasound as well as a CT of the abdomen and pelvis in the last year. She had a thickwalled stomach on CT but no clear Baldo Ash it on EGD or endoscopic ultrasound in the past. He also had a colonoscopy as workup for iron deficiency anemia including celiac studies. She is due for followup CBC and ferritin in November.

## 2011-08-14 NOTE — Telephone Encounter (Signed)
Patient called stated diovan/hct was changed to plain diovan on 08/07/11.States ever since her B/P has been elevated 185/85 and has not been less than 160 .States she is worried.States she is tired and dizzy.Spoke to [facility] PA and he advised to continue diovan 160 mg twice daily and start Norvasc 5 mg every morning.Patient advised to keep appointment with Lorin Picket 08/24/11 and call back sooner if needed.

## 2011-08-14 NOTE — Patient Instructions (Addendum)
We have sent the following medications to your pharmacy for you to pick up at your convenience: Generic Bentyl  You have orders in the computer to come for labs in November 2013 for a CBC, Ferritin level.  The lab is open 7:30am - 5:00pm, no appointment is needed.

## 2011-08-14 NOTE — Telephone Encounter (Signed)
Please return call to patient [phone] regarding medication questions.

## 2011-08-17 ENCOUNTER — Telehealth: Payer: Self-pay | Admitting: Cardiology

## 2011-08-17 MED ORDER — VALSARTAN 160 MG PO TABS: 160.0000 mg | ORAL_TABLET | Freq: Two times a day (BID) | ORAL | Status: DC

## 2011-08-17 NOTE — Telephone Encounter (Signed)
N/A at pt number.  LM to call back.

## 2011-08-17 NOTE — Telephone Encounter (Signed)
New msg Pt said her BP is high 183/81 and her BP is not working. She wanted to talk to a nurse

## 2011-08-17 NOTE — Telephone Encounter (Signed)
Pt was notified and agrees. 

## 2011-08-17 NOTE — Telephone Encounter (Signed)
Take 2nd Diovan 160 mg now instead of later tonight. Tomorrow, take Diovan 320 mg in AM and Take the Norvasc 5 mg around 12 pm. Call us back tomorrow with update on BP. If she is having severe headache, mental status changes or neurologic deficits, she needs to go to the ED now. Tereso Newcomer, PA-C  2:11 PM 08/17/2011

## 2011-08-17 NOTE — Telephone Encounter (Signed)
Pt complaining of an elevated bp since her medication was changed.  She states she is currently taking Diovan 160mg  bid, Metoprolol 25mg  bid and Norvasc 5mg  qd.  She states the lowest it has been is 145/70 yesterday.  Today it is 173/75 and 168/75 after taking her medications.  She states her head is "not clear and has a lot of pressure on the side of her head".

## 2011-08-17 NOTE — Telephone Encounter (Signed)
I agree

## 2011-08-18 ENCOUNTER — Telehealth: Payer: Self-pay | Admitting: Cardiology

## 2011-08-18 MED ORDER — VALSARTAN 320 MG PO TABS: 320.0000 mg | ORAL_TABLET | Freq: Every day | ORAL | Status: DC

## 2011-08-18 NOTE — Telephone Encounter (Signed)
Pt updated and told to continue as directed, monitor bp/p and call with further questions, pt agreed to plan.

## 2011-08-18 NOTE — Telephone Encounter (Signed)
Pt took diovan 320 mg this am along with metoprolol 25mg   Then at noon took norvasc 5 mg at noon, she will then take metoprolol 25 mg this evening. Pt states she feels much better. Tereso Newcomer PA  informed and stated to continue to take meds this way,. Pt informed.  Morning BP prior to meds 177/83 p 72 Hour after morn meds 135/72 p 70 Took noon meds bp post 130/65 p 70

## 2011-08-18 NOTE — Telephone Encounter (Signed)
Pt had some b/p issues and they made some changes to her medication and was told to call today so she needs a call back

## 2011-08-24 ENCOUNTER — Encounter: Payer: Self-pay | Admitting: Cardiology

## 2011-08-24 ENCOUNTER — Other Ambulatory Visit: Payer: Medicare Other

## 2011-08-24 ENCOUNTER — Ambulatory Visit: Payer: Medicare Other | Admitting: Physician Assistant

## 2011-08-24 ENCOUNTER — Ambulatory Visit (INDEPENDENT_AMBULATORY_CARE_PROVIDER_SITE_OTHER): Payer: Medicare Other | Admitting: Cardiology

## 2011-08-24 VITALS — BP 171/79 | HR 83 | Ht 60.0 in | Wt 175.0 lb

## 2011-08-24 DIAGNOSIS — R079 Chest pain, unspecified: Secondary | ICD-10-CM

## 2011-08-24 DIAGNOSIS — I10 Essential (primary) hypertension: Secondary | ICD-10-CM | POA: Diagnosis not present

## 2011-08-24 DIAGNOSIS — I498 Other specified cardiac arrhythmias: Secondary | ICD-10-CM

## 2011-08-24 DIAGNOSIS — E7800 Pure hypercholesterolemia, unspecified: Secondary | ICD-10-CM

## 2011-08-24 MED ORDER — AMLODIPINE BESYLATE 2.5 MG PO TABS: 2.5000 mg | ORAL_TABLET | Freq: Every day | ORAL | Status: DC

## 2011-08-24 MED ORDER — VALSARTAN-HYDROCHLOROTHIAZIDE 160-12.5 MG PO TABS: 2.0000 | ORAL_TABLET | Freq: Every day | ORAL | Status: DC

## 2011-08-24 NOTE — Patient Instructions (Addendum)
Please increase your Norvasc by 2.5 mg at bedtime. Continue all other medications as listed  Please return fasting early next week for blood work (Lipid, hepatic and basic metabolic panel)  Follow up in 2 months with Dr Antoine Poche.

## 2011-08-24 NOTE — Progress Notes (Signed)
HPI The patient presents for followup of difficult to control hypertension. Recently her blood pressure has been elevated and she's not been having any of the orthostatic symptoms or low blood pressures that she had previously. She notices a trend where her blood pressure is highest in the morning and comes down slightly by noon it is still elevated through the evening. She's had a little burning chest discomfort occasionally. She denies any new shortness of breath, PND or orthopnea. She denies any weight gain or edema. She's had no further tachypalpitations such as she had in the past with supraventricular tachycardia. Of note she is taking 320 of her Diovan which is double what she was on previously without HCTZ.   No Known Allergies  Current Outpatient Prescriptions  Medication Sig Dispense Refill  . amLODipine (NORVASC) 5 MG tablet Take 1 tablet (5 mg total) by mouth daily.  30 tablet  6  . aspirin 81 MG tablet Take 81 mg by mouth daily.        . cholecalciferol (VITAMIN D) 1000 UNITS tablet Take 1,000 Units by mouth daily.        Marland Kitchen dicyclomine (BENTYL) 10 MG capsule Take 1 capsule (10 mg total) by mouth every 6 (six) hours as needed (abdominal pain- may take 2 capsules if 1 ineffective).  60 capsule  0  . esomeprazole (NEXIUM) 40 MG capsule Take 1 capsule (40 mg total) by mouth daily.  90 capsule  3  . ESTRACE VAGINAL 0.1 MG/GM vaginal cream       . Ferrous Sulfate (IRON) 28 MG TABS Take by mouth daily.        . fluticasone (FLONASE) 50 MCG/ACT nasal spray Place 2 sprays into the nose daily.        . FOLBIC 2.5-25-2 MG TABS TAKE 1 TABLET BY MOUTH EVERY DAY  90 tablet  3  . metFORMIN (GLUCOPHAGE-XR) 500 MG 24 hr tablet Take 2 tablets (1,000 mg total) by mouth 2 (two) times daily.  360 tablet  3  . metoCLOPramide (REGLAN) 5 MG tablet Take 1 tablet by mouth as needed. Takes at least twice a day      . metoprolol tartrate (LOPRESSOR) 25 MG tablet Take 1 tablet (25 mg total) by mouth 2 (two)  times daily.  180 tablet  2  . Multiple Vitamin (MULTIVITAMIN) tablet Take 1 tablet by mouth daily.        . Multiple Vitamins-Minerals (VISION FORMULA PO) Take by mouth.        . ondansetron (ZOFRAN) 4 MG tablet Take 4 mg by mouth every 4 (four) hours as needed. For nausea        . potassium chloride (K-DUR,KLOR-CON) 10 MEQ tablet Take 1 tablet (10 mEq total) by mouth 2 (two) times daily.  180 tablet  3  . rosuvastatin (CRESTOR) 10 MG tablet Take 1 tablet (10 mg total) by mouth daily.  90 tablet  1  . valsartan-hydrochlorothiazide (DIOVAN-HCT) 160-12.5 MG per tablet       . vitamin E 100 UNIT capsule Take by mouth daily.          Past Medical History  Diagnosis Date  . Gastroparesis   . Iron deficiency anemia, unspecified   . Pure hypercholesterolemia   . Headache   . Unspecified essential hypertension   . Esophageal reflux   . Type II or unspecified type diabetes mellitus without mention of complication, not stated as uncontrolled   . Arthritis     Spinal Osteoarthritis  .  Diverticulosis 08/30/2000    Colonoscopy   . Gastric polyp     Fundic Gland  . Anxiety   . Cataract   . Heart murmur     Echocardiogram 2/11: EF 60-65%, mild LAE, grade 1 diastolic dysfunction, aortic valve sclerosis, mean gradient 9 mm of mercury, PASP 34  . Stroke     tia  . Chest pain     Myoview 1/11: EF 82%, no ischemia.  Marland Kitchen PSVT (paroxysmal supraventricular tachycardia)   . Carcinoid tumor of stomach     Past Surgical History  Procedure Date  . Cholecystectomy 1993  . Tonsillectomy   . Colonoscopy 11/11/2010    diverticulosis  . Esophagogastroduodenoscopy 08/29/2010; 09/15/2010    Carcinoid tumor less than 1 cm in July 2012 not seen in August 2012 , gastritis, fundic gland polyps  . Eus 12/15/2010    Procedure: UPPER ENDOSCOPIC ULTRASOUND (EUS) LINEAR;  Surgeon: Rob Bunting, MD;  Location: [facility];  Service: Endoscopy;  Laterality: N/A;  . Esophagogastroduodenoscopy 05/16/2011    ROS:  As  stated in the HPI and negative for all other systems.  PHYSICAL EXAM BP 171/79  Pulse 83  Ht 5' (1.524 m)  Wt 175 lb (79.379 kg)  BMI 34.18 kg/m2  SpO2 97% GENERAL:  Well appearing HEENT:  Pupils equal round and reactive, fundi not visualized, oral mucosa unremarkable NECK:  No jugular venous distention, waveform within normal limits, carotid upstroke brisk and symmetric, no bruits, no thyromegaly LYMPHATICS:  No cervical, inguinal adenopathy LUNGS:  Clear to auscultation bilaterally BACK:  No CVA tenderness CHEST:  Unremarkable HEART:  PMI not displaced or sustained,S1 and S2 within normal limits, no S3, no S4, no clicks, no rubs, no murmurs ABD:  Flat, positive bowel sounds normal in frequency in pitch, no bruits, no rebound, no guarding, no midline pulsatile mass, no hepatomegaly, no splenomegaly EXT:  2 plus pulses throughout, no edema, no cyanosis no clubbing SKIN:  No rashes no nodules NEURO:  Cranial nerves II through XII grossly intact, motor grossly intact throughout PSYCH:  Cognitively intact, oriented to person place and time   ASSESSMENT AND PLAN  Hypertension - At this point I will increase her Norvasc to 7-1/2 mg and have her start taking it at night. She will continue to check her blood pressure diary.  Supraventricular Tachycardia - She's had no symptomatic dysrhythmias. No change in therapy or further evaluation is warranted.  Orthostatic Hypotension - She's no longer orthostatic. Med changes are as above.  Dyslipidemia -  I reviewed her lipids done this spring. She was put on Crestor at that time in response to an elevated LDL. She would like to have this repeated and I will bring her back fasting.  Chest pain -  This is atypical. I reviewed a stress perfusion study 2011. There was no evidence of ischemia or infarct. No further workup is suggested.

## 2011-08-30 ENCOUNTER — Telehealth: Payer: Self-pay | Admitting: Internal Medicine

## 2011-08-30 NOTE — Telephone Encounter (Signed)
Patient will try OTC Prilosec 20 mg 2 tabs daily.

## 2011-08-30 NOTE — Telephone Encounter (Signed)
Patient wants to know if Dr. Leone Payor thinks it would be okay to take Prilosec instead of Nexium because it is cheaper. She wants his opinion before changing.

## 2011-08-30 NOTE — Telephone Encounter (Signed)
She can try that but needs to take 2 Prilosec 20 mg tabs to try to = 1 40 mg Nexium- if OTC  I can prescribe generic omeprazole 40 mg if she wants

## 2011-08-31 ENCOUNTER — Other Ambulatory Visit (INDEPENDENT_AMBULATORY_CARE_PROVIDER_SITE_OTHER): Payer: Medicare Other

## 2011-08-31 DIAGNOSIS — I498 Other specified cardiac arrhythmias: Secondary | ICD-10-CM

## 2011-08-31 DIAGNOSIS — E7800 Pure hypercholesterolemia, unspecified: Secondary | ICD-10-CM

## 2011-08-31 DIAGNOSIS — I10 Essential (primary) hypertension: Secondary | ICD-10-CM

## 2011-08-31 LAB — BASIC METABOLIC PANEL
CO2: 29 mEq/L (ref 19–32)
Calcium: 9.7 mg/dL (ref 8.4–10.5)
Creatinine, Ser: 0.6 mg/dL (ref 0.4–1.2)
GFR: 109.1 mL/min (ref >60.00)
Sodium: 139 mEq/L (ref 135–145)

## 2011-08-31 LAB — LIPID PANEL
HDL: 70.6 mg/dL (ref >39.00)
LDL Cholesterol: 86 mg/dL (ref 0–99)
Total CHOL/HDL Ratio: 2
Triglycerides: 99 mg/dL (ref 0.0–149.0)
VLDL: 19.8 mg/dL (ref 0.0–40.0)

## 2011-08-31 LAB — HEPATIC FUNCTION PANEL
Alkaline Phosphatase: 52 U/L (ref 39–117)
Bilirubin, Direct: 0.1 mg/dL (ref 0.0–0.3)
Total Bilirubin: 0.9 mg/dL (ref 0.3–1.2)
Total Protein: 7.2 g/dL (ref 6.0–8.3)

## 2011-09-01 ENCOUNTER — Telehealth: Payer: Self-pay | Admitting: Cardiology

## 2011-09-01 NOTE — Telephone Encounter (Signed)
Pt was given her lab results 

## 2011-09-01 NOTE — Telephone Encounter (Signed)
New Problem:    Patient called wanting to know the results of her latest blood work.  Please call back.

## 2011-09-05 NOTE — Telephone Encounter (Signed)
F/u   Patient calling for lab results again, also has questions about BP, she can be reached at hm#

## 2011-09-05 NOTE — Telephone Encounter (Signed)
NA X several rings continue to attempt to contact.

## 2011-09-06 ENCOUNTER — Encounter: Payer: Self-pay | Admitting: *Deleted

## 2011-09-06 NOTE — Telephone Encounter (Signed)
Spoke with pt - reviewed her lab results with her again.  She understands that her numbers look great and I don't anticipate any changes in her medications based on these results.  She is aware her NA is back to normal since the diuretic has been stopped.  Reviewed with her when to take her amlodipine as she was concerned that her BP was too low to take it at night.  Twice her BP has been around 111/55.  She has not had any s/s related BP at this level.  She is instructed to take the pm dose as ordered unless she has any dizziness.  She states understanding.

## 2011-09-06 NOTE — Telephone Encounter (Signed)
Pt wants to know what the provider thinks about her blood work because when nurse gave her blood results she was told provider had not looked at it yet and she wants to know what he thinks and she is having an issue with her b/p being low and she wants to discuss this

## 2011-09-12 ENCOUNTER — Telehealth: Payer: Self-pay | Admitting: Cardiology

## 2011-09-12 NOTE — Telephone Encounter (Signed)
Pt aware to continue her current medications as ordered.

## 2011-09-12 NOTE — Telephone Encounter (Signed)
Continue current medications. 

## 2011-09-12 NOTE — Telephone Encounter (Signed)
Pt is concerned that her BP remains elevated in the AM.  She reports that her systolic is 151-155 in the am and is better in the pm.  She would like to know if she should remain on her current dose of amlodipine before she refills it.  She is aware Dr Antoine Poche in working in another office today.  I will send this information to him for review and orders

## 2011-09-12 NOTE — Telephone Encounter (Signed)
Pt taking BP med, pt on amlodipine and norvasc, does she needs to continue according to last weeks blood test, she needs refill if to continue, pls call [phone] ok to leave message, BP in the am is in the 150's

## 2011-10-11 DIAGNOSIS — H01029 Squamous blepharitis unspecified eye, unspecified eyelid: Secondary | ICD-10-CM | POA: Diagnosis not present

## 2011-10-20 ENCOUNTER — Encounter: Payer: Self-pay | Admitting: Endocrinology

## 2011-10-20 ENCOUNTER — Ambulatory Visit (INDEPENDENT_AMBULATORY_CARE_PROVIDER_SITE_OTHER)
Admission: RE | Admit: 2011-10-20 | Discharge: 2011-10-20 | Disposition: A | Discharge status: Home / Self Care | Payer: Medicare Other | Source: Ambulatory Visit | Attending: Endocrinology | Admitting: Endocrinology

## 2011-10-20 ENCOUNTER — Ambulatory Visit (INDEPENDENT_AMBULATORY_CARE_PROVIDER_SITE_OTHER): Payer: Medicare Other | Admitting: Endocrinology

## 2011-10-20 ENCOUNTER — Other Ambulatory Visit (INDEPENDENT_AMBULATORY_CARE_PROVIDER_SITE_OTHER): Payer: Medicare Other

## 2011-10-20 VITALS — BP 118/72 | HR 76 | Temp 98.0°F | Ht 60.0 in | Wt 174.0 lb

## 2011-10-20 DIAGNOSIS — E7800 Pure hypercholesterolemia, unspecified: Secondary | ICD-10-CM

## 2011-10-20 DIAGNOSIS — M2550 Pain in unspecified joint: Secondary | ICD-10-CM

## 2011-10-20 DIAGNOSIS — D509 Iron deficiency anemia, unspecified: Secondary | ICD-10-CM

## 2011-10-20 DIAGNOSIS — M79609 Pain in unspecified limb: Secondary | ICD-10-CM | POA: Diagnosis not present

## 2011-10-20 DIAGNOSIS — E119 Type 2 diabetes mellitus without complications: Secondary | ICD-10-CM | POA: Diagnosis not present

## 2011-10-20 DIAGNOSIS — Z79899 Other long term (current) drug therapy: Secondary | ICD-10-CM

## 2011-10-20 DIAGNOSIS — I10 Essential (primary) hypertension: Secondary | ICD-10-CM | POA: Diagnosis not present

## 2011-10-20 DIAGNOSIS — M79641 Pain in right hand: Secondary | ICD-10-CM

## 2011-10-20 DIAGNOSIS — M79642 Pain in left hand: Secondary | ICD-10-CM | POA: Insufficient documentation

## 2011-10-20 LAB — CBC WITH DIFFERENTIAL/PLATELET
Basophils Absolute: 0 10*3/uL (ref 0.0–0.1)
Eosinophils Relative: 0.4 % (ref 0.0–5.0)
HCT: 37.3 % (ref 36.0–46.0)
Lymphs Abs: 2.1 10*3/uL (ref 0.7–4.0)
MCV: 84.3 fl (ref 78.0–100.0)
Monocytes Absolute: 0.6 10*3/uL (ref 0.1–1.0)
Neutrophils Relative %: 68.7 % (ref 43.0–77.0)
Platelets: 224 10*3/uL (ref 150.0–400.0)
RDW: 14 % (ref 11.5–14.6)
WBC: 8.9 10*3/uL (ref 4.5–10.5)

## 2011-10-20 LAB — BASIC METABOLIC PANEL
CO2: 24 mEq/L (ref 19–32)
Calcium: 9.5 mg/dL (ref 8.4–10.5)
Glucose, Bld: 101 mg/dL — ABNORMAL HIGH (ref 70–99)
Potassium: 4 mEq/L (ref 3.5–5.1)
Sodium: 137 mEq/L (ref 135–145)

## 2011-10-20 LAB — LIPID PANEL
HDL: 61 mg/dL (ref >39.00)
Total CHOL/HDL Ratio: 3
Triglycerides: 138 mg/dL (ref 0.0–149.0)

## 2011-10-20 LAB — URINALYSIS, ROUTINE W REFLEX MICROSCOPIC
Bilirubin Urine: NEGATIVE
Leukocytes, UA: NEGATIVE
Nitrite: NEGATIVE
Total Protein, Urine: NEGATIVE
Urobilinogen, UA: 0.2 (ref 0.0–1.0)

## 2011-10-20 LAB — HEPATIC FUNCTION PANEL
AST: 32 U/L (ref 0–37)
Albumin: 4.2 g/dL (ref 3.5–5.2)
Alkaline Phosphatase: 62 U/L (ref 39–117)
Total Protein: 7.6 g/dL (ref 6.0–8.3)

## 2011-10-20 LAB — TSH: TSH: 0.9 u[IU]/mL (ref 0.35–5.50)

## 2011-10-20 LAB — IBC PANEL
Iron: 43 ug/dL (ref 42–145)
Saturation Ratios: 9.2 % — ABNORMAL LOW (ref 20.0–50.0)

## 2011-10-20 NOTE — Progress Notes (Signed)
Subjective:    [name], female    DOB: 01/07/1941, 71 y.o.   MRN: [id number]  HPI Pt states few weeks of moderate pain at both hands and wrists, and assoc pain at both hips.  She is unable to further localize the pain.   Pt says cbg at lunch is sometimes as low as 65. Past Medical History  Diagnosis Date  . Gastroparesis   . Iron deficiency anemia, unspecified   . Pure hypercholesterolemia   . Headache   . Unspecified essential hypertension   . Esophageal reflux   . Type II or unspecified type diabetes mellitus without mention of complication, not stated as uncontrolled   . Arthritis     Spinal Osteoarthritis  . Diverticulosis 08/30/2000    Colonoscopy   . Gastric polyp     Fundic Gland  . Anxiety   . Cataract   . Heart murmur     Echocardiogram 2/11: EF 60-65%, mild LAE, grade 1 diastolic dysfunction, aortic valve sclerosis, mean gradient 9 mm of mercury, PASP 34  . Stroke     tia  . Chest pain     Myoview 1/11: EF 82%, no ischemia.  Marland Kitchen PSVT (paroxysmal supraventricular tachycardia)   . Carcinoid tumor of stomach     Past Surgical History  Procedure Date  . Cholecystectomy 1993  . Tonsillectomy   . Colonoscopy 11/11/2010    diverticulosis  . Esophagogastroduodenoscopy 08/29/2010; 09/15/2010    Carcinoid tumor less than 1 cm in July 2012 not seen in August 2012 , gastritis, fundic gland polyps  . Eus 12/15/2010    Procedure: UPPER ENDOSCOPIC ULTRASOUND (EUS) LINEAR;  Surgeon: Rob Bunting, MD;  Location: [facility];  Service: Endoscopy;  Laterality: N/A;  . Esophagogastroduodenoscopy 05/16/2011    History   Social History  . Marital Status: Married    Spouse Name: N/A    Number of Children: 0  . Years of Education: N/A   Occupational History  . Retail    Social History Main Topics  . Smoking status: Never Smoker   . Smokeless tobacco: Never Used   Comment: Daily Caffeine Use -1  . Alcohol Use: No  . Drug Use: No  . Sexually Active: Not  on file   Other Topics Concern  . Not on file   Social History Narrative   One caffeine drink daily     Current Outpatient Prescriptions on File Prior to Visit  Medication Sig Dispense Refill  . amLODipine (NORVASC) 2.5 MG tablet Take 1 tablet (2.5 mg total) by mouth at bedtime.  30 tablet  11  . amLODipine (NORVASC) 5 MG tablet Take 1 tablet (5 mg total) by mouth daily.  30 tablet  6  . aspirin 81 MG tablet Take 81 mg by mouth daily.        . cholecalciferol (VITAMIN D) 1000 UNITS tablet Take 1,000 Units by mouth daily.        Marland Kitchen dicyclomine (BENTYL) 10 MG capsule Take 1 capsule (10 mg total) by mouth every 6 (six) hours as needed (abdominal pain- may take 2 capsules if 1 ineffective).  60 capsule  0  . esomeprazole (NEXIUM) 40 MG capsule Take 1 capsule (40 mg total) by mouth daily.  90 capsule  3  . ESTRACE VAGINAL 0.1 MG/GM vaginal cream       . Ferrous Sulfate (IRON) 28 MG TABS Take by mouth daily.        . fluticasone (FLONASE) 50 MCG/ACT nasal  spray Place 2 sprays into the nose daily.        . FOLBIC 2.5-25-2 MG TABS TAKE 1 TABLET BY MOUTH EVERY DAY  90 tablet  3  . metFORMIN (GLUCOPHAGE-XR) 500 MG 24 hr tablet Take 2 tablets (1,000 mg total) by mouth 2 (two) times daily.  360 tablet  3  . metoCLOPramide (REGLAN) 5 MG tablet Take 1 tablet by mouth as needed. Takes at least twice a day      . metoprolol tartrate (LOPRESSOR) 25 MG tablet Take 1 tablet (25 mg total) by mouth 2 (two) times daily.  180 tablet  2  . Multiple Vitamin (MULTIVITAMIN) tablet Take 1 tablet by mouth daily.        . Multiple Vitamins-Minerals (VISION FORMULA PO) Take by mouth.        . ondansetron (ZOFRAN) 4 MG tablet Take 4 mg by mouth every 4 (four) hours as needed. For nausea        . potassium chloride (K-DUR,KLOR-CON) 10 MEQ tablet Take 1 tablet (10 mEq total) by mouth 2 (two) times daily.  180 tablet  3  . rosuvastatin (CRESTOR) 10 MG tablet Take 1 tablet (10 mg total) by mouth daily.  90 tablet  1  .  vitamin E 100 UNIT capsule Take by mouth daily.          No Known Allergies  Family History  Problem Relation Age of Onset  . Diabetes Mother   . Stroke Father     deceased age 53  . Heart disease Sister     deceased MI age 56  . Heart disease Brother     deceased MI age 41  . Colon cancer Neg Hx     BP 118/72  Pulse 76  Temp 98 F (36.7 C) (Oral)  Ht 5' (1.524 m)  Wt 174 lb (78.926 kg)  BMI 33.98 kg/m2  SpO2 97%  Review of Systems Denies rash and fever    Objective:   Physical Exam VITAL SIGNS:  See vs page GENERAL: no distress Hands: normal, except for slight oa changes  Lab Results  Component Value Date   WBC 8.9 10/20/2011   HGB 12.2 10/20/2011   HCT 37.3 10/20/2011   PLT 224.0 10/20/2011   GLUCOSE 101* 10/20/2011   CHOL 180 10/20/2011   TRIG 138.0 10/20/2011   HDL 61.00 10/20/2011   LDLDIRECT 123.7 04/13/2011   LDLCALC 91 10/20/2011   ALT 28 10/20/2011   AST 32 10/20/2011   NA 137 10/20/2011   K 4.0 10/20/2011   CL 102 10/20/2011   CREATININE 0.6 10/20/2011   BUN 20 10/20/2011   CO2 24 10/20/2011   TSH 0.90 10/20/2011   INR 0.88 01/16/2011   HGBA1C 6.4 10/20/2011   MICROALBUR 0.4 10/20/2011  (i also reviewed x-ray results)    Assessment & Plan:  Hand pain, new, uncertain etiology DM, well-controlled HTN, well-controlled

## 2011-10-20 NOTE — Patient Instructions (Signed)
blood tests and x-rays are being requested for you today.  You will receive a letter with results. Refer to a hand specialist.  you will receive a phone call, about a day and time for an appointment.

## 2011-10-21 ENCOUNTER — Encounter: Payer: Self-pay | Admitting: Endocrinology

## 2011-10-21 LAB — RHEUMATOID FACTOR: Rhuematoid fact SerPl-aCnc: <10 IU/mL (ref <=14)

## 2011-10-23 ENCOUNTER — Telehealth: Payer: Self-pay | Admitting: *Deleted

## 2011-10-23 MED ORDER — METFORMIN HCL ER 500 MG PO TB24: 1000.0000 mg | ORAL_TABLET | Freq: Two times a day (BID) | ORAL | Status: DC

## 2011-10-23 NOTE — Telephone Encounter (Signed)
Called pt to inform of lab results, pt informed (letter also mailed to pt). Rx for Metformin also sent to mail order pharmacy per pt's request.

## 2011-10-25 ENCOUNTER — Ambulatory Visit (INDEPENDENT_AMBULATORY_CARE_PROVIDER_SITE_OTHER): Payer: Medicare Other | Admitting: Cardiology

## 2011-10-25 ENCOUNTER — Encounter: Payer: Self-pay | Admitting: Cardiology

## 2011-10-25 VITALS — BP 145/70 | HR 70 | Ht 62.0 in | Wt 172.8 lb

## 2011-10-25 DIAGNOSIS — I10 Essential (primary) hypertension: Secondary | ICD-10-CM | POA: Diagnosis not present

## 2011-10-25 NOTE — Patient Instructions (Addendum)
The current medical regimen is effective;  continue present plan and medications.  Follow up in 6 months with Dr Hochrein.  You will receive a letter in the mail 2 months before you are due.  Please call us when you receive this letter to schedule your follow up appointment.  

## 2011-10-25 NOTE — Progress Notes (Signed)
HPI The patient presents for followup of difficult to control hypertension. At the last visit I increased her Norvasc. She hasn't really been keeping her blood pressure but the few readings she brings today demonstrate that it may be slightly high in the morning, normalizes during the rest of the day and is sometimes slightly lower in the evening. She's not having any presyncope or syncope. She's not having any chest discomfort, neck or arm discomfort. She is unfortunately under great stress taking care of her husband is almost total care. She's not having any shortness of breath, PND or orthopnea.   No Known Allergies  Current Outpatient Prescriptions  Medication Sig Dispense Refill  . amLODipine (NORVASC) 2.5 MG tablet Take 1 tablet (2.5 mg total) by mouth at bedtime.  30 tablet  11  . amLODipine (NORVASC) 5 MG tablet Take 1 tablet (5 mg total) by mouth daily.  30 tablet  6  . aspirin 81 MG tablet Take 81 mg by mouth daily.        . cholecalciferol (VITAMIN D) 1000 UNITS tablet Take 1,000 Units by mouth daily.        Marland Kitchen dicyclomine (BENTYL) 10 MG capsule Take 1 capsule (10 mg total) by mouth every 6 (six) hours as needed (abdominal pain- may take 2 capsules if 1 ineffective).  60 capsule  0  . esomeprazole (NEXIUM) 40 MG capsule Take 1 capsule (40 mg total) by mouth daily.  90 capsule  3  . ESTRACE VAGINAL 0.1 MG/GM vaginal cream       . Ferrous Sulfate (IRON) 28 MG TABS Take by mouth daily.        . FOLBIC 2.5-25-2 MG TABS TAKE 1 TABLET BY MOUTH EVERY DAY  90 tablet  3  . metFORMIN (GLUCOPHAGE-XR) 500 MG 24 hr tablet Take 2 tablets (1,000 mg total) by mouth 2 (two) times daily.  360 tablet  3  . metoCLOPramide (REGLAN) 5 MG tablet Take 1 tablet by mouth as needed. Takes at least twice a day      . metoprolol tartrate (LOPRESSOR) 25 MG tablet Take 1 tablet (25 mg total) by mouth 2 (two) times daily.  180 tablet  2  . Multiple Vitamin (MULTIVITAMIN) tablet Take 1 tablet by mouth daily.          . Multiple Vitamins-Minerals (VISION FORMULA PO) Take by mouth.        . ondansetron (ZOFRAN) 4 MG tablet Take 4 mg by mouth every 4 (four) hours as needed. For nausea        . potassium chloride (K-DUR,KLOR-CON) 10 MEQ tablet Take 1 tablet (10 mEq total) by mouth 2 (two) times daily.  180 tablet  3  . rosuvastatin (CRESTOR) 10 MG tablet Take 1 tablet (10 mg total) by mouth daily.  90 tablet  1  . valsartan (DIOVAN) 320 MG tablet Take 320 mg by mouth daily.      . vitamin E 100 UNIT capsule Take by mouth daily.          Past Medical History  Diagnosis Date  . Gastroparesis   . Iron deficiency anemia, unspecified   . Pure hypercholesterolemia   . Headache   . Unspecified essential hypertension   . Esophageal reflux   . Type II or unspecified type diabetes mellitus without mention of complication, not stated as uncontrolled   . Arthritis     Spinal Osteoarthritis  . Diverticulosis 08/30/2000    Colonoscopy   . Gastric polyp  Fundic Gland  . Anxiety   . Cataract   . Heart murmur     Echocardiogram 2/11: EF 60-65%, mild LAE, grade 1 diastolic dysfunction, aortic valve sclerosis, mean gradient 9 mm of mercury, PASP 34  . Stroke     tia  . Chest pain     Myoview 1/11: EF 82%, no ischemia.  Marland Kitchen PSVT (paroxysmal supraventricular tachycardia)   . Carcinoid tumor of stomach     Past Surgical History  Procedure Date  . Cholecystectomy 1993  . Tonsillectomy   . Colonoscopy 11/11/2010    diverticulosis  . Esophagogastroduodenoscopy 08/29/2010; 09/15/2010    Carcinoid tumor less than 1 cm in July 2012 not seen in August 2012 , gastritis, fundic gland polyps  . Eus 12/15/2010    Procedure: UPPER ENDOSCOPIC ULTRASOUND (EUS) LINEAR;  Surgeon: Rob Bunting, MD;  Location: [facility];  Service: Endoscopy;  Laterality: N/A;  . Esophagogastroduodenoscopy 05/16/2011    ROS:  As stated in the HPI and negative for all other systems.  PHYSICAL EXAM BP 145/70  Pulse 70  Ht 5\' 2"  (1.575 m)   Wt 172 lb 12.8 oz (78.382 kg)  BMI 31.61 kg/m2 GENERAL:  Well appearing HEENT:  Pupils equal round and reactive, fundi not visualized, oral mucosa unremarkable NECK:  No jugular venous distention, waveform within normal limits, carotid upstroke brisk and symmetric, no bruits, no thyromegaly LYMPHATICS:  No cervical, inguinal adenopathy LUNGS:  Clear to auscultation bilaterally BACK:  No CVA tenderness CHEST:  Unremarkable HEART:  PMI not displaced or sustained,S1 and S2 within normal limits, no S3, no S4, no clicks, no rubs, no murmurs ABD:  Flat, positive bowel sounds normal in frequency in pitch, no bruits, no rebound, no guarding, no midline pulsatile mass, no hepatomegaly, no splenomegaly EXT:  2 plus pulses throughout, no edema, no cyanosis no clubbing   ASSESSMENT AND PLAN  Hypertension - She only came with a BP readings today.  It is still somewhat elevated in the AM but low in the evening.  I have elected not to change her BP meds at this time particularly in light of her previous orthostasis.    Supraventricular Tachycardia - She's had no symptomatic dysrhythmias. No change in therapy or further evaluation is warranted.  Dyslipidemia -  She's having trouble for Crestor. She was switched from Lipitor previously because of muscle aches but she's not sure this made a big difference. Her labs were actually slightly better on Lipitor. She would like to switch back and when she runs out of her current samples and the meds she ordered I would be happy to do this. She will [place] when she runs out.  Chest pain -  This is atypical. I reviewed a stress perfusion study 2011. There was no evidence of ischemia or infarct. No further workup is suggested.

## 2011-10-27 ENCOUNTER — Telehealth: Payer: Self-pay | Admitting: Cardiology

## 2011-10-27 MED ORDER — AMLODIPINE BESYLATE 5 MG PO TABS: 5.0000 mg | ORAL_TABLET | Freq: Every day | ORAL | Status: DC

## 2011-10-27 MED ORDER — VALSARTAN 320 MG PO TABS: 320.0000 mg | ORAL_TABLET | Freq: Every day | ORAL | Status: DC

## 2011-10-27 MED ORDER — AMLODIPINE BESYLATE 2.5 MG PO TABS: 2.5000 mg | ORAL_TABLET | Freq: Every day | ORAL | Status: DC

## 2011-10-27 NOTE — Telephone Encounter (Signed)
Pt needed RX sent to [facility] for amlodipine and diovan.  This was completed.

## 2011-10-27 NOTE — Telephone Encounter (Signed)
Pt calling re BP meds, may need refill but needs to talk to nurse first

## 2011-10-30 DIAGNOSIS — M545 Low back pain: Secondary | ICD-10-CM | POA: Diagnosis not present

## 2011-10-30 DIAGNOSIS — M25549 Pain in joints of unspecified hand: Secondary | ICD-10-CM | POA: Diagnosis not present

## 2011-11-14 ENCOUNTER — Telehealth: Payer: Self-pay | Admitting: Endocrinology

## 2011-11-14 NOTE — Telephone Encounter (Signed)
Faxed 3 pages from [facility]. To Dr. Romero Belling for review on 11-14-11 ym

## 2011-11-28 ENCOUNTER — Ambulatory Visit (INDEPENDENT_AMBULATORY_CARE_PROVIDER_SITE_OTHER): Payer: Medicare Other | Admitting: Endocrinology

## 2011-11-28 DIAGNOSIS — Z23 Encounter for immunization: Secondary | ICD-10-CM

## 2011-12-14 ENCOUNTER — Telehealth: Payer: Self-pay

## 2011-12-14 NOTE — Telephone Encounter (Signed)
Message copied by Annett Fabian on Thu Dec 14, 2011 11:36 AM ------      Message from: Stan Head E      Created: Thu Dec 14, 2011 11:21 AM       These are ok - looks like iron is a little low still so stay on iron and follow-up PCP on that      ----- Message -----         From: Rossie Muskrat, RN,CGRN         Sent: 12/13/2011   4:57 PM           To: Iva Boop, MD            Dr. Leone Payor you wanted her to have a CBC and a ferritin around now.  She recently did have labs with Dr. Everardo All from September.  No ferritin, but her Hgb was normal.  Her iron saturation was low.  Will these labs do or should I have her come in?  You ordered this from the labs 06/06/11      ----- Message -----         From: Rossie Muskrat, RN,CGRN         Sent: 12/09/2011           To: Rossie Muskrat, [facility]            Needs labs see note from 06/08/11

## 2011-12-14 NOTE — Telephone Encounter (Signed)
Patient advised that it was not necessary to come for scheduled labs.  She is aware to remain on her iron and follow up with Dr. Everardo All.

## 2012-01-19 ENCOUNTER — Telehealth: Payer: Self-pay | Admitting: Cardiology

## 2012-01-19 NOTE — Telephone Encounter (Signed)
Pt states this AM her BP was 180/? This afternoon it was 140/75  She is concerned that it is related to the PCN she was put on for the infection in her tooth.  Explained the BP could possibly be elevated because of the pain from the infection but not the PCN.  She will continue to monitor her BP.  She is taking her medications as listed.  She will call back if further problems

## 2012-01-19 NOTE — Telephone Encounter (Signed)
New problem:   Has an infection in the tooth. Start medication penicillin . C/O Blood pressure  range 160-180. Up/ down.  Please advise

## 2012-01-22 ENCOUNTER — Encounter: Payer: Self-pay | Admitting: Endocrinology

## 2012-01-22 ENCOUNTER — Ambulatory Visit (INDEPENDENT_AMBULATORY_CARE_PROVIDER_SITE_OTHER): Payer: Medicare Other | Admitting: Endocrinology

## 2012-01-22 VITALS — BP 136/68 | HR 75 | Temp 97.8°F | Wt 173.0 lb

## 2012-01-22 DIAGNOSIS — R51 Headache: Secondary | ICD-10-CM

## 2012-01-22 MED ORDER — HYDROCODONE-ACETAMINOPHEN 10-325 MG PO TABS: ORAL_TABLET | ORAL | Status: DC

## 2012-01-22 NOTE — Patient Instructions (Addendum)
Here is a prescription, for a pain medication.

## 2012-01-22 NOTE — Progress Notes (Signed)
Subjective:    [name], female    DOB: 17-Dec-1940, 71 y.o.   MRN: [id number]  HPI Pt states 1 week of moderate pain at the left temporal area, and assoc numbness.  She saw a dentist, and was rx'ed PCN. The pain is unchanged so far.  It is unaffected by opening and closing the jaw.  No assoc visual loss.  She says she saw dr love for this in the past, and no cause was found.   Past Medical History  Diagnosis Date  . Gastroparesis   . Iron deficiency anemia, unspecified   . Pure hypercholesterolemia   . Headache   . Unspecified essential hypertension   . Esophageal reflux   . Type II or unspecified type diabetes mellitus without mention of complication, not stated as uncontrolled   . Arthritis     Spinal Osteoarthritis  . Diverticulosis 08/30/2000    Colonoscopy   . Gastric polyp     Fundic Gland  . Anxiety   . Cataract   . Heart murmur     Echocardiogram 2/11: EF 60-65%, mild LAE, grade 1 diastolic dysfunction, aortic valve sclerosis, mean gradient 9 mm of mercury, PASP 34  . Stroke     tia  . Chest pain     Myoview 1/11: EF 82%, no ischemia.  Marland Kitchen PSVT (paroxysmal supraventricular tachycardia)   . Carcinoid tumor of stomach     Past Surgical History  Procedure Date  . Cholecystectomy 1993  . Tonsillectomy   . Colonoscopy 11/11/2010    diverticulosis  . Esophagogastroduodenoscopy 08/29/2010; 09/15/2010    Carcinoid tumor less than 1 cm in July 2012 not seen in August 2012 , gastritis, fundic gland polyps  . Eus 12/15/2010    Procedure: UPPER ENDOSCOPIC ULTRASOUND (EUS) LINEAR;  Surgeon: Rob Bunting, MD;  Location: [facility];  Service: Endoscopy;  Laterality: N/A;  . Esophagogastroduodenoscopy 05/16/2011    History   Social History  . Marital Status: Married    Spouse Name: N/A    Number of Children: 0  . Years of Education: N/A   Occupational History  . Retail    Social History Main Topics  . Smoking status: Never Smoker   . Smokeless  tobacco: Never Used     Comment: Daily Caffeine Use -1  . Alcohol Use: No  . Drug Use: No  . Sexually Active: Not on file   Other Topics Concern  . Not on file   Social History Narrative   One caffeine drink daily     Current Outpatient Prescriptions on File Prior to Visit  Medication Sig Dispense Refill  . amLODipine (NORVASC) 2.5 MG tablet Take 1 tablet (2.5 mg total) by mouth at bedtime.  90 tablet  3  . amLODipine (NORVASC) 5 MG tablet Take 1 tablet (5 mg total) by mouth daily.  90 tablet  3  . aspirin 81 MG tablet Take 81 mg by mouth daily.        . cholecalciferol (VITAMIN D) 1000 UNITS tablet Take 1,000 Units by mouth daily.        Marland Kitchen dicyclomine (BENTYL) 10 MG capsule Take 1 capsule (10 mg total) by mouth every 6 (six) hours as needed (abdominal pain- may take 2 capsules if 1 ineffective).  60 capsule  0  . esomeprazole (NEXIUM) 40 MG capsule Take 1 capsule (40 mg total) by mouth daily.  90 capsule  3  . ESTRACE VAGINAL 0.1 MG/GM vaginal cream       .  Ferrous Sulfate (IRON) 28 MG TABS Take by mouth daily.        . FOLBIC 2.5-25-2 MG TABS TAKE 1 TABLET BY MOUTH EVERY DAY  90 tablet  3  . metFORMIN (GLUCOPHAGE-XR) 500 MG 24 hr tablet Take 2 tablets (1,000 mg total) by mouth 2 (two) times daily.  360 tablet  3  . metoCLOPramide (REGLAN) 5 MG tablet Take 1 tablet by mouth as needed. Takes at least twice a day      . metoprolol tartrate (LOPRESSOR) 25 MG tablet Take 1 tablet (25 mg total) by mouth 2 (two) times daily.  180 tablet  2  . Multiple Vitamin (MULTIVITAMIN) tablet Take 1 tablet by mouth daily.        . Multiple Vitamins-Minerals (VISION FORMULA PO) Take by mouth.        . ondansetron (ZOFRAN) 4 MG tablet Take 4 mg by mouth every 4 (four) hours as needed. For nausea        . potassium chloride (K-DUR,KLOR-CON) 10 MEQ tablet Take 1 tablet (10 mEq total) by mouth 2 (two) times daily.  180 tablet  3  . rosuvastatin (CRESTOR) 10 MG tablet Take 1 tablet (10 mg total) by mouth  daily.  90 tablet  1  . valsartan (DIOVAN) 320 MG tablet Take 1 tablet (320 mg total) by mouth daily.  90 tablet  3  . vitamin E 100 UNIT capsule Take by mouth daily.          Allergies  Allergen Reactions  . Tramadol     Dizziness     Family History  Problem Relation Age of Onset  . Diabetes Mother   . Stroke Father     deceased age 96  . Heart disease Sister     deceased MI age 39  . Heart disease Brother     deceased MI age 82  . Colon cancer Neg Hx     BP 136/68  Pulse 75  Temp 97.8 F (36.6 C) (Oral)  Wt 173 lb (78.472 kg)  SpO2 97%    Review of Systems Denies fever, but she has left otalgia.    Objective:   Physical Exam VITAL SIGNS:  See vs page GENERAL: no distress head: no deformity eyes: no periorbital swelling, no proptosis external nose and ears are normal mouth: no lesion seen Both eac's and tm's are normal Neck: supple.   Gait: normal and steady.    CT 1 year ago (for same sxs) was normal    Assessment & Plan:  Headache, recurrent, uncertain etiology

## 2012-01-28 DIAGNOSIS — R519 Headache, unspecified: Secondary | ICD-10-CM | POA: Insufficient documentation

## 2012-01-28 DIAGNOSIS — R51 Headache: Secondary | ICD-10-CM | POA: Insufficient documentation

## 2012-02-26 ENCOUNTER — Telehealth: Payer: Self-pay | Admitting: Cardiology

## 2012-02-26 ENCOUNTER — Other Ambulatory Visit: Payer: Self-pay | Admitting: Endocrinology

## 2012-02-26 DIAGNOSIS — Z1231 Encounter for screening mammogram for malignant neoplasm of breast: Secondary | ICD-10-CM

## 2012-02-26 NOTE — Telephone Encounter (Signed)
Spoke with pt. Pt states on Saturday she had episode of heart rate around 98 and this is fast for her. BP was very high --?198. Pt put her face in cold water and the symptoms resolved. Pt states the last time this happened was over a year ago. Pt states she feels fine now. She is asking if Dr Antoine Poche has any new recommendations as a result of this episode of fast heart rate on Saturday.

## 2012-02-26 NOTE — Telephone Encounter (Signed)
No new suggestions  

## 2012-02-26 NOTE — Telephone Encounter (Signed)
Follow-up:    Patient called in because she still has not hear anything form Pam.  Please call back.

## 2012-02-26 NOTE — Telephone Encounter (Signed)
New Problem:    Patient called in wanting to speak with you because she had some irregular heartbeats.  Please call back.

## 2012-02-28 ENCOUNTER — Ambulatory Visit: Payer: Medicare Other | Admitting: Endocrinology

## 2012-03-06 ENCOUNTER — Ambulatory Visit: Payer: Medicare Other | Admitting: Endocrinology

## 2012-03-11 ENCOUNTER — Encounter: Payer: Self-pay | Admitting: Endocrinology

## 2012-03-11 ENCOUNTER — Ambulatory Visit (INDEPENDENT_AMBULATORY_CARE_PROVIDER_SITE_OTHER): Payer: Medicare Other | Admitting: Endocrinology

## 2012-03-11 VITALS — BP 122/70 | HR 79 | Wt 173.0 lb

## 2012-03-11 DIAGNOSIS — E559 Vitamin D deficiency, unspecified: Secondary | ICD-10-CM | POA: Diagnosis not present

## 2012-03-11 DIAGNOSIS — E119 Type 2 diabetes mellitus without complications: Secondary | ICD-10-CM | POA: Diagnosis not present

## 2012-03-11 DIAGNOSIS — M79642 Pain in left hand: Secondary | ICD-10-CM

## 2012-03-11 DIAGNOSIS — M79609 Pain in unspecified limb: Secondary | ICD-10-CM | POA: Diagnosis not present

## 2012-03-11 LAB — HEMOGLOBIN A1C: Hgb A1c MFr Bld: 6.5 % (ref 4.6–6.5)

## 2012-03-11 NOTE — Progress Notes (Signed)
Subjective:    [name], female    DOB: 11-Mar-1940, 72 y.o.   MRN: [id number]  HPI The state of at least three ongoing medical problems is addressed today, with interval history of each noted here: bilat hand arthralgias persist, but she does not want to take narcotics.   Pt returns for f/u of type 2 DM (dx'ed 2004).  she says cbg in am is sometimes as low as 60. Pt says she has h/o vit-d deficiency.  She denies muscle cramps. Past Medical History  Diagnosis Date  . Gastroparesis   . Iron deficiency anemia, unspecified   . Pure hypercholesterolemia   . Headache   . Unspecified essential hypertension   . Esophageal reflux   . Type II or unspecified type diabetes mellitus without mention of complication, not stated as uncontrolled   . Arthritis     Spinal Osteoarthritis  . Diverticulosis 08/30/2000    Colonoscopy   . Gastric polyp     Fundic Gland  . Anxiety   . Cataract   . Heart murmur     Echocardiogram 2/11: EF 60-65%, mild LAE, grade 1 diastolic dysfunction, aortic valve sclerosis, mean gradient 9 mm of mercury, PASP 34  . Stroke     tia  . Chest pain     Myoview 1/11: EF 82%, no ischemia.  Marland Kitchen PSVT (paroxysmal supraventricular tachycardia)   . Carcinoid tumor of stomach     Past Surgical History  Procedure Date  . Cholecystectomy 1993  . Tonsillectomy   . Colonoscopy 11/11/2010    diverticulosis  . Esophagogastroduodenoscopy 08/29/2010; 09/15/2010    Carcinoid tumor less than 1 cm in July 2012 not seen in August 2012 , gastritis, fundic gland polyps  . Eus 12/15/2010    Procedure: UPPER ENDOSCOPIC ULTRASOUND (EUS) LINEAR;  Surgeon: Rob Bunting, MD;  Location: [facility];  Service: Endoscopy;  Laterality: N/A;  . Esophagogastroduodenoscopy 05/16/2011    History   Social History  . Marital Status: Married    Spouse Name: N/A    Number of Children: 0  . Years of Education: N/A   Occupational History  . Retail    Social History Main Topics   . Smoking status: Never Smoker   . Smokeless tobacco: Never Used     Comment: Daily Caffeine Use -1  . Alcohol Use: No  . Drug Use: No  . Sexually Active: Not on file   Other Topics Concern  . Not on file   Social History Narrative   One caffeine drink daily     Current Outpatient Prescriptions on File Prior to Visit  Medication Sig Dispense Refill  . amLODipine (NORVASC) 2.5 MG tablet Take 1 tablet (2.5 mg total) by mouth at bedtime.  90 tablet  3  . amLODipine (NORVASC) 5 MG tablet Take 1 tablet (5 mg total) by mouth daily.  90 tablet  3  . aspirin 81 MG tablet Take 81 mg by mouth daily.        . cholecalciferol (VITAMIN D) 1000 UNITS tablet Take 1,000 Units by mouth daily.        Marland Kitchen dicyclomine (BENTYL) 10 MG capsule Take 1 capsule (10 mg total) by mouth every 6 (six) hours as needed (abdominal pain- may take 2 capsules if 1 ineffective).  60 capsule  0  . esomeprazole (NEXIUM) 40 MG capsule Take 1 capsule (40 mg total) by mouth daily.  90 capsule  3  . ESTRACE VAGINAL 0.1 MG/GM vaginal cream       .  Ferrous Sulfate (IRON) 28 MG TABS Take by mouth daily.        . FOLBIC 2.5-25-2 MG TABS TAKE 1 TABLET BY MOUTH EVERY DAY  90 tablet  3  . HYDROcodone-acetaminophen (NORCO) 10-325 MG per tablet 1/2 or 1 pill every 4 hours as needed for pain or headache  50 tablet  1  . metFORMIN (GLUCOPHAGE-XR) 500 MG 24 hr tablet Take 1,000 mg by mouth daily with breakfast.      . metoCLOPramide (REGLAN) 5 MG tablet Take 1 tablet by mouth as needed. Takes at least twice a day      . metoprolol tartrate (LOPRESSOR) 25 MG tablet Take 1 tablet (25 mg total) by mouth 2 (two) times daily.  180 tablet  2  . Multiple Vitamin (MULTIVITAMIN) tablet Take 1 tablet by mouth daily.        . Multiple Vitamins-Minerals (VISION FORMULA PO) Take by mouth.        . ondansetron (ZOFRAN) 4 MG tablet Take 4 mg by mouth every 4 (four) hours as needed. For nausea        . potassium chloride (K-DUR,KLOR-CON) 10 MEQ tablet  Take 1 tablet (10 mEq total) by mouth 2 (two) times daily.  180 tablet  3  . rosuvastatin (CRESTOR) 10 MG tablet Take 1 tablet (10 mg total) by mouth daily.  90 tablet  1  . valsartan (DIOVAN) 320 MG tablet Take 1 tablet (320 mg total) by mouth daily.  90 tablet  3  . vitamin E 100 UNIT capsule Take by mouth daily.          Allergies  Allergen Reactions  . Tramadol     Dizziness     Family History  Problem Relation Age of Onset  . Diabetes Mother   . Stroke Father     deceased age 17  . Heart disease Sister     deceased MI age 75  . Heart disease Brother     deceased MI age 76  . Colon cancer Neg Hx     BP 122/70  Pulse 79  Wt 173 lb (78.472 kg)  SpO2 97%  Review of Systems Denies weight change and numbness.      Objective:   Physical Exam Pulses: dorsalis pedis intact bilat.   Feet: no deformity.  no ulcer on the feet.  feet are of normal color and temp.  no edema.   Neuro: sensation is intact to touch on the feet.  Lab Results  Component Value Date   HGBA1C 6.5 03/11/2012      Assessment & Plan:  DM: well-controlled Arthralgias, well-controlled H/o vit-d deficiency, ? persistent

## 2012-03-11 NOTE — Patient Instructions (Addendum)
Decrease metformin to 1000 mg daily. Try putting aspercream on your hands. blood tests are being requested for you today.  We'll contact you with results. Please come back for a "medicare wellness" appointment in 6 months.

## 2012-03-12 LAB — VITAMIN D 25 HYDROXY (VIT D DEFICIENCY, FRACTURES): Vit D, 25-Hydroxy: 57 ng/mL (ref 30–89)

## 2012-03-22 ENCOUNTER — Ambulatory Visit: Payer: Medicare Other

## 2012-03-26 ENCOUNTER — Other Ambulatory Visit: Payer: Self-pay | Admitting: *Deleted

## 2012-03-26 MED ORDER — METOCLOPRAMIDE HCL 5 MG PO TABS: ORAL_TABLET | ORAL | Status: DC

## 2012-04-03 ENCOUNTER — Other Ambulatory Visit: Payer: Self-pay

## 2012-04-03 MED ORDER — METOCLOPRAMIDE HCL 5 MG PO TABS: ORAL_TABLET | ORAL | Status: DC

## 2012-04-09 ENCOUNTER — Ambulatory Visit: Payer: Medicare Other

## 2012-04-10 ENCOUNTER — Ambulatory Visit
Admission: RE | Admit: 2012-04-10 | Discharge: 2012-04-10 | Disposition: A | Discharge status: Home / Self Care | Payer: Medicare Other | Source: Ambulatory Visit | Attending: Endocrinology | Admitting: Endocrinology

## 2012-04-10 DIAGNOSIS — Z1231 Encounter for screening mammogram for malignant neoplasm of breast: Secondary | ICD-10-CM | POA: Diagnosis not present

## 2012-04-11 DIAGNOSIS — H1130 Conjunctival hemorrhage, unspecified eye: Secondary | ICD-10-CM | POA: Diagnosis not present

## 2012-04-12 ENCOUNTER — Ambulatory Visit: Payer: Medicare Other | Admitting: Cardiology

## 2012-04-18 ENCOUNTER — Ambulatory Visit: Payer: Medicare Other

## 2012-04-22 ENCOUNTER — Encounter: Payer: Self-pay | Admitting: Internal Medicine

## 2012-04-24 ENCOUNTER — Other Ambulatory Visit: Payer: Self-pay

## 2012-04-24 DIAGNOSIS — R000 Tachycardia, unspecified: Secondary | ICD-10-CM

## 2012-04-24 MED ORDER — METOPROLOL TARTRATE 25 MG PO TABS: 25.0000 mg | ORAL_TABLET | Freq: Two times a day (BID) | ORAL | Status: DC

## 2012-05-13 ENCOUNTER — Encounter: Payer: Self-pay | Admitting: Cardiology

## 2012-05-13 ENCOUNTER — Ambulatory Visit (INDEPENDENT_AMBULATORY_CARE_PROVIDER_SITE_OTHER): Payer: Medicare Other | Admitting: Cardiology

## 2012-05-13 VITALS — BP 132/70 | HR 80 | Ht 62.0 in | Wt 174.0 lb

## 2012-05-13 DIAGNOSIS — I10 Essential (primary) hypertension: Secondary | ICD-10-CM

## 2012-05-13 DIAGNOSIS — E7800 Pure hypercholesterolemia, unspecified: Secondary | ICD-10-CM | POA: Diagnosis not present

## 2012-05-13 DIAGNOSIS — I498 Other specified cardiac arrhythmias: Secondary | ICD-10-CM

## 2012-05-13 NOTE — Progress Notes (Signed)
HPI The patient presents for followup of difficult to control hypertension. At the last visit I made no change in her meds. Since that time she has done relatively well. She did have one episode of sustained tachypalpitations. She put her face in a bucket of ice water and this result. This was on January 20. She's not having any presyncope or syncope. She's not having any chest discomfort, neck or arm discomfort. She is unfortunately under great stress taking care of her husband is almost total care. She's not having any shortness of breath, PND or orthopnea.  She has had no weight gain. She does have some chronic lower extremity edema.    Allergies  Allergen Reactions  . Tramadol     Dizziness     Current Outpatient Prescriptions  Medication Sig Dispense Refill  . amLODipine (NORVASC) 2.5 MG tablet Take 1 tablet (2.5 mg total) by mouth at bedtime.  90 tablet  3  . amLODipine (NORVASC) 5 MG tablet Take 1 tablet (5 mg total) by mouth daily.  90 tablet  3  . aspirin 81 MG tablet Take 81 mg by mouth daily.        . cholecalciferol (VITAMIN D) 1000 UNITS tablet Take 1,000 Units by mouth daily.        Marland Kitchen dicyclomine (BENTYL) 10 MG capsule Take 1 capsule (10 mg total) by mouth every 6 (six) hours as needed (abdominal pain- may take 2 capsules if 1 ineffective).  60 capsule  0  . esomeprazole (NEXIUM) 40 MG capsule Take 1 capsule (40 mg total) by mouth daily.  90 capsule  3  . ESTRACE VAGINAL 0.1 MG/GM vaginal cream Place vaginally as needed.       . Ferrous Sulfate (IRON) 28 MG TABS Take by mouth daily.        . FOLBIC 2.5-25-2 MG TABS TAKE 1 TABLET BY MOUTH EVERY DAY  90 tablet  3  . metoCLOPramide (REGLAN) 5 MG tablet Take one tablet by mouth before each meal and every night at bedtrime  120 tablet  3  . metoprolol tartrate (LOPRESSOR) 25 MG tablet Take 1 tablet (25 mg total) by mouth 2 (two) times daily.  180 tablet  2  . Multiple Vitamin (MULTIVITAMIN) tablet Take 1 tablet by mouth daily.         . Multiple Vitamins-Minerals (VISION FORMULA PO) Take by mouth.        . ondansetron (ZOFRAN) 4 MG tablet Take 4 mg by mouth every 4 (four) hours as needed. For nausea        . potassium chloride (K-DUR,KLOR-CON) 10 MEQ tablet Take 1 tablet (10 mEq total) by mouth 2 (two) times daily.  180 tablet  3  . rosuvastatin (CRESTOR) 10 MG tablet Take 1 tablet (10 mg total) by mouth daily.  90 tablet  1  . valsartan (DIOVAN) 320 MG tablet Take 1 tablet (320 mg total) by mouth daily.  90 tablet  3  . vitamin E 100 UNIT capsule Take by mouth daily.        . metFORMIN (GLUCOPHAGE-XR) 500 MG 24 hr tablet Take 1,000 mg by mouth 2 (two) times daily.        No current facility-administered medications for this visit.    Past Medical History  Diagnosis Date  . Gastroparesis   . Iron deficiency anemia, unspecified   . Pure hypercholesterolemia   . Headache   . Unspecified essential hypertension   . Esophageal reflux   .  Type II or unspecified type diabetes mellitus without mention of complication, not stated as uncontrolled   . Arthritis     Spinal Osteoarthritis  . Diverticulosis 08/30/2000    Colonoscopy   . Gastric polyp     Fundic Gland  . Anxiety   . Cataract   . Heart murmur     Echocardiogram 2/11: EF 60-65%, mild LAE, grade 1 diastolic dysfunction, aortic valve sclerosis, mean gradient 9 mm of mercury, PASP 34  . Stroke     tia  . Chest pain     Myoview 1/11: EF 82%, no ischemia.  Marland Kitchen PSVT (paroxysmal supraventricular tachycardia)   . Carcinoid tumor of stomach     Past Surgical History  Procedure Laterality Date  . Cholecystectomy  1993  . Tonsillectomy    . Colonoscopy  11/11/2010    diverticulosis  . Esophagogastroduodenoscopy  08/29/2010; 09/15/2010    Carcinoid tumor less than 1 cm in July 2012 not seen in August 2012 , gastritis, fundic gland polyps  . Eus  12/15/2010    Procedure: UPPER ENDOSCOPIC ULTRASOUND (EUS) LINEAR;  Surgeon: Rob Bunting, MD;  Location: [facility];   Service: Endoscopy;  Laterality: N/A;  . Esophagogastroduodenoscopy  05/16/2011    ROS:  As stated in the HPI and negative for all other systems.  PHYSICAL EXAM BP 132/70  Pulse 80  Ht 5\' 2"  (1.575 m)  Wt 174 lb (78.926 kg)  BMI 31.82 kg/m2 GENERAL:  Well appearing HEENT:  Pupils equal round and reactive, fundi not visualized, oral mucosa unremarkable NECK:  No jugular venous distention, waveform within normal limits, carotid upstroke brisk and symmetric, no bruits, no thyromegaly LYMPHATICS:  No cervical, inguinal adenopathy LUNGS:  Clear to auscultation bilaterally BACK:  No CVA tenderness CHEST:  Unremarkable HEART:  PMI not displaced or sustained,S1 and S2 within normal limits, no S3, no S4, no clicks, no rubs, slight systolic murmur peaking and heard best at the right upper sternal border, no diastolicmurmurs ABD:  Flat, positive bowel sounds normal in frequency in pitch, no bruits, no rebound, no guarding, no midline pulsatile mass, no hepatomegaly, no splenomegaly EXT:  2 plus pulses throughout, mild bilateral ankle edema, no cyanosis no clubbing   ASSESSMENT AND PLAN  Hypertension - Her blood pressure is improved. I reviewed her blood pressure diary again today. No change in therapy is indicated.  Supraventricular Tachycardia - She has had only one episode of tachycardia arrhythmias. There is a possibility that this is anxiety as well but I suspect it is her SVT. This broke with a vagal maneuver. No change in therapy is indicated.  Dyslipidemia -  She actually says she's tolerating Crestor which she thought she was having trouble with before. She will continue the meds as listed.  Chest pain -  She has had no new symptoms.  No further testing is indicated.

## 2012-05-13 NOTE — Patient Instructions (Addendum)
The current medical regimen is effective;  continue present plan and medications.  Please return for a fasting lipid profile.  Follow up in 6 months with Dr Antoine Poche.  You will receive a letter in the mail 2 months before you are due.  Please call us when you receive this letter to schedule your follow up appointment.

## 2012-05-14 ENCOUNTER — Telehealth: Payer: Self-pay

## 2012-05-14 ENCOUNTER — Other Ambulatory Visit: Payer: Self-pay | Admitting: Endocrinology

## 2012-05-14 MED ORDER — METFORMIN HCL ER 500 MG PO TB24: 1000.0000 mg | ORAL_TABLET | Freq: Every day | ORAL | Status: DC

## 2012-05-14 NOTE — Telephone Encounter (Signed)
100 mg qd.  i have update med list

## 2012-05-14 NOTE — Telephone Encounter (Signed)
Pt would like to know what is the dose she should be taking of the metformin?

## 2012-05-14 NOTE — Telephone Encounter (Signed)
Left message on vmail.

## 2012-05-21 ENCOUNTER — Other Ambulatory Visit: Payer: Self-pay | Admitting: *Deleted

## 2012-05-21 MED ORDER — FA-PYRIDOXINE-CYANOCOBALAMIN 2.5-25-2 MG PO TABS: 1.0000 | ORAL_TABLET | Freq: Every day | ORAL | Status: DC

## 2012-05-22 ENCOUNTER — Other Ambulatory Visit: Payer: Self-pay | Admitting: *Deleted

## 2012-05-22 MED ORDER — FA-PYRIDOXINE-CYANOCOBALAMIN 2.5-25-2 MG PO TABS: 1.0000 | ORAL_TABLET | Freq: Every day | ORAL | Status: DC

## 2012-05-22 NOTE — Telephone Encounter (Signed)
Rx sent to [name]. Resending to [facility] in [place].

## 2012-05-28 DIAGNOSIS — E119 Type 2 diabetes mellitus without complications: Secondary | ICD-10-CM | POA: Diagnosis not present

## 2012-05-31 ENCOUNTER — Ambulatory Visit (AMBULATORY_SURGERY_CENTER): Payer: Medicare Other

## 2012-05-31 VITALS — Ht 62.0 in | Wt 176.0 lb

## 2012-05-31 DIAGNOSIS — D131 Benign neoplasm of stomach: Secondary | ICD-10-CM

## 2012-06-03 ENCOUNTER — Ambulatory Visit (INDEPENDENT_AMBULATORY_CARE_PROVIDER_SITE_OTHER): Payer: Medicare Other | Admitting: Endocrinology

## 2012-06-03 ENCOUNTER — Ambulatory Visit
Admission: RE | Admit: 2012-06-03 | Discharge: 2012-06-03 | Disposition: A | Discharge status: Home / Self Care | Payer: Medicare Other | Source: Ambulatory Visit | Attending: Endocrinology | Admitting: Endocrinology

## 2012-06-03 ENCOUNTER — Encounter: Payer: Self-pay | Admitting: Endocrinology

## 2012-06-03 VITALS — BP 140/78 | HR 80 | Temp 97.6°F | Wt 175.0 lb

## 2012-06-03 DIAGNOSIS — M545 Low back pain: Secondary | ICD-10-CM

## 2012-06-03 DIAGNOSIS — M47817 Spondylosis without myelopathy or radiculopathy, lumbosacral region: Secondary | ICD-10-CM | POA: Diagnosis not present

## 2012-06-03 DIAGNOSIS — E119 Type 2 diabetes mellitus without complications: Secondary | ICD-10-CM

## 2012-06-03 LAB — URINALYSIS, ROUTINE W REFLEX MICROSCOPIC
Bilirubin Urine: NEGATIVE
Ketones, ur: NEGATIVE
Total Protein, Urine: NEGATIVE
pH: 5.5 (ref 5.0–8.0)

## 2012-06-03 NOTE — Patient Instructions (Addendum)
A urine test and x-rays are being requested for you today.  We'll contact you with results.   I hope you feel better soon.  If you don't feel better by next week, please call back.

## 2012-06-03 NOTE — Progress Notes (Signed)
Subjective:    [name], female    DOB: 12-02-1940, 72 y.o.   MRN: [id number]  HPI Pt states 1 week of moderate pain at the left lumbar paravertebral area, but no assoc hematuria.  Past Medical History  Diagnosis Date  . Gastroparesis   . Iron deficiency anemia, unspecified   . Pure hypercholesterolemia   . Headache   . Unspecified essential hypertension   . Esophageal reflux   . Type II or unspecified type diabetes mellitus without mention of complication, not stated as uncontrolled   . Arthritis     Spinal Osteoarthritis  . Diverticulosis 08/30/2000    Colonoscopy   . Gastric polyp     Fundic Gland  . Anxiety   . Cataract   . Heart murmur     Echocardiogram 2/11: EF 60-65%, mild LAE, grade 1 diastolic dysfunction, aortic valve sclerosis, mean gradient 9 mm of mercury, PASP 34  . Stroke     tia  . Chest pain     Myoview 1/11: EF 82%, no ischemia.  Marland Kitchen PSVT (paroxysmal supraventricular tachycardia)   . Carcinoid tumor of stomach     Past Surgical History  Procedure Laterality Date  . Cholecystectomy  1993  . Tonsillectomy    . Colonoscopy  11/11/2010    diverticulosis  . Esophagogastroduodenoscopy  08/29/2010; 09/15/2010    Carcinoid tumor less than 1 cm in July 2012 not seen in August 2012 , gastritis, fundic gland polyps  . Eus  12/15/2010    Procedure: UPPER ENDOSCOPIC ULTRASOUND (EUS) LINEAR;  Surgeon: Rob Bunting, MD;  Location: [facility];  Service: Endoscopy;  Laterality: N/A;  . Esophagogastroduodenoscopy  05/16/2011    History   Social History  . Marital Status: Married    Spouse Name: N/A    Number of Children: 0  . Years of Education: N/A   Occupational History  . Retail    Social History Main Topics  . Smoking status: Never Smoker   . Smokeless tobacco: Never Used     Comment: Daily Caffeine Use -1  . Alcohol Use: No  . Drug Use: No  . Sexually Active: Not on file   Other Topics Concern  . Not on file   Social History  Narrative   One caffeine drink daily     Current Outpatient Prescriptions on File Prior to Visit  Medication Sig Dispense Refill  . amLODipine (NORVASC) 2.5 MG tablet Take 1 tablet (2.5 mg total) by mouth at bedtime.  90 tablet  3  . amLODipine (NORVASC) 5 MG tablet Take 1 tablet (5 mg total) by mouth daily.  90 tablet  3  . aspirin 81 MG tablet Take 81 mg by mouth daily.        . cholecalciferol (VITAMIN D) 1000 UNITS tablet Take 1,000 Units by mouth daily.        Marland Kitchen dicyclomine (BENTYL) 10 MG capsule Take 1 capsule (10 mg total) by mouth every 6 (six) hours as needed (abdominal pain- may take 2 capsules if 1 ineffective).  60 capsule  0  . esomeprazole (NEXIUM) 40 MG capsule Take 1 capsule (40 mg total) by mouth daily.  90 capsule  3  . ESTRACE VAGINAL 0.1 MG/GM vaginal cream Place vaginally as needed.       . Ferrous Sulfate (IRON) 28 MG TABS Take by mouth daily.        . folic acid-pyridoxine-cyancobalamin (FOLBIC) 2.5-25-2 MG TABS Take 1 tablet by mouth daily.  90  tablet  3  . HYDROcodone-acetaminophen (NORCO) 10-325 MG per tablet       . metFORMIN (GLUCOPHAGE-XR) 500 MG 24 hr tablet Take 2 tablets (1,000 mg total) by mouth daily.  30 tablet  11  . metoCLOPramide (REGLAN) 5 MG tablet Take one tablet by mouth before each meal and every night at bedtrime  120 tablet  3  . metoprolol tartrate (LOPRESSOR) 25 MG tablet Take 1 tablet (25 mg total) by mouth 2 (two) times daily.  180 tablet  2  . Multiple Vitamin (MULTIVITAMIN) tablet Take 1 tablet by mouth daily.        . Multiple Vitamins-Minerals (VISION FORMULA PO) Take by mouth.        . ondansetron (ZOFRAN) 4 MG tablet Take 4 mg by mouth every 4 (four) hours as needed. For nausea        . rosuvastatin (CRESTOR) 10 MG tablet Take 1 tablet (10 mg total) by mouth daily.  90 tablet  1  . valsartan (DIOVAN) 320 MG tablet Take 1 tablet (320 mg total) by mouth daily.  90 tablet  3  . vitamin E 100 UNIT capsule Take by mouth daily.        .  potassium chloride (K-DUR,KLOR-CON) 10 MEQ tablet Take 1 tablet (10 mEq total) by mouth 2 (two) times daily.  180 tablet  3   No current facility-administered medications on file prior to visit.    Allergies  Allergen Reactions  . Tramadol     Dizziness    Family History  Problem Relation Age of Onset  . Diabetes Mother   . Stroke Father     deceased age 28  . Heart disease Sister     deceased MI age 24  . Heart disease Brother     deceased MI age 6  . Colon cancer Neg Hx    BP 140/78  Pulse 80  Temp(Src) 97.6 F (36.4 C) (Oral)  Wt 175 lb (79.379 kg)  BMI 32 kg/m2  SpO2 94%  Review of Systems denies dysuria and urinary retention.      Objective:   Physical Exam VITAL SIGNS:  See vs page GENERAL: no distress Lower back: nontender.      Assessment & Plan:  Flank pain, new, uncertain etiology.  Pt can continue same pain medication.

## 2012-06-07 ENCOUNTER — Other Ambulatory Visit (INDEPENDENT_AMBULATORY_CARE_PROVIDER_SITE_OTHER): Payer: Medicare Other

## 2012-06-07 DIAGNOSIS — Z79899 Other long term (current) drug therapy: Secondary | ICD-10-CM

## 2012-06-07 DIAGNOSIS — Z789 Other specified health status: Secondary | ICD-10-CM

## 2012-06-07 DIAGNOSIS — E7800 Pure hypercholesterolemia, unspecified: Secondary | ICD-10-CM | POA: Diagnosis not present

## 2012-06-07 DIAGNOSIS — IMO0001 Reserved for inherently not codable concepts without codable children: Secondary | ICD-10-CM

## 2012-06-10 LAB — LIPID PANEL
HDL: 67 mg/dL (ref >39.00)
LDL Cholesterol: 98 mg/dL (ref 0–99)
Total CHOL/HDL Ratio: 3
Triglycerides: 147 mg/dL (ref 0.0–149.0)
VLDL: 29.4 mg/dL (ref 0.0–40.0)

## 2012-06-10 LAB — BASIC METABOLIC PANEL
CO2: 27 mEq/L (ref 19–32)
Chloride: 103 mEq/L (ref 96–112)
Glucose, Bld: 135 mg/dL — ABNORMAL HIGH (ref 70–99)
Sodium: 137 mEq/L (ref 135–145)

## 2012-06-11 ENCOUNTER — Telehealth: Payer: Self-pay | Admitting: Cardiology

## 2012-06-11 NOTE — Telephone Encounter (Signed)
New problem   Pt want to know the results of blood work. Please call pt.

## 2012-06-11 NOTE — Telephone Encounter (Signed)
Dr Antoine Poche to review results and I will call pt then

## 2012-06-12 NOTE — Telephone Encounter (Signed)
Reviewed results of lab work with pt in great detail  She is aware that Dr Antoine Poche has not reviewed but that I will call her back if he wants to make any changes.

## 2012-06-14 ENCOUNTER — Encounter: Payer: Self-pay | Admitting: Internal Medicine

## 2012-06-14 ENCOUNTER — Ambulatory Visit (AMBULATORY_SURGERY_CENTER): Payer: Medicare Other | Admitting: Internal Medicine

## 2012-06-14 ENCOUNTER — Other Ambulatory Visit: Payer: Self-pay | Admitting: Internal Medicine

## 2012-06-14 VITALS — BP 141/75 | HR 72 | Temp 98.3°F | Resp 29 | Ht 62.0 in | Wt 176.0 lb

## 2012-06-14 DIAGNOSIS — D131 Benign neoplasm of stomach: Secondary | ICD-10-CM | POA: Diagnosis not present

## 2012-06-14 DIAGNOSIS — F411 Generalized anxiety disorder: Secondary | ICD-10-CM | POA: Diagnosis not present

## 2012-06-14 DIAGNOSIS — D3A092 Benign carcinoid tumor of the stomach: Secondary | ICD-10-CM

## 2012-06-14 DIAGNOSIS — D490 Neoplasm of unspecified behavior of digestive system: Secondary | ICD-10-CM | POA: Diagnosis not present

## 2012-06-14 HISTORY — PX: ESOPHAGOGASTRODUODENOSCOPY: SHX1529

## 2012-06-14 LAB — GLUCOSE, CAPILLARY: Glucose-Capillary: 138 mg/dL — ABNORMAL HIGH (ref 70–99)

## 2012-06-14 MED ORDER — SODIUM CHLORIDE 0.9 % IV SOLN: 500.0000 mL | INTRAVENOUS | Status: DC

## 2012-06-14 NOTE — Progress Notes (Signed)
Patient did not experience any of the following events: a burn prior to discharge; a fall within the facility; wrong site/side/patient/procedure/implant event; or a hospital transfer or hospital admission upon discharge from the facility. (G8907) Patient did not have preoperative order for IV antibiotic SSI prophylaxis. (G8918)  

## 2012-06-14 NOTE — Op Note (Signed)
[facility] [address] [place], [address]   ENDOSCOPY PROCEDURE REPORT  PATIENT: [name], [name]  MR#: [id number] BIRTHDATE: 10/06/1940 , 72  yrs. old GENDER: Female ENDOSCOPIST: Iva Boop, MD, [facility] PROCEDURE DATE:  06/14/2012 PROCEDURE:  EGD w/ biopsy ASA CLASS:     Class II INDICATIONS:  follow-up of neuroendocrine tumor of stomach, gastric polyps (fundic gland). MEDICATIONS: Propofol (Diprivan) 160 mg IV, MAC sedation, administered by CRNA, and These medications were titrated to patient response per physician's verbal order TOPICAL ANESTHETIC: none  DESCRIPTION OF PROCEDURE: After the risks benefits and alternatives of the procedure were thoroughly explained, informed consent was obtained.  The LB [id number] endoscope was introduced through the mouth and advanced to the second portion of the duodenum. Without limitations. Some photos (esophagus, gastric retroflexion, antrum ) were not captured due to image manager issue. The instrument was slowly withdrawn as the mucosa was fully examined.        STOMACH: An innumerable number of polypoid shaped and smooth sessile polyps measuring 2-7 mm in size were found in the cardia, gastric fundus, and gastric body.  Multiple biopsies was performed using cold forceps.  Sample sent for histology.  The remainder of the upper endoscopy exam was otherwise normal. Retroflexed views revealed polyps as already described.     The scope was then withdrawn from the patient and the procedure completed.  COMPLICATIONS: There were no complications. ENDOSCOPIC IMPRESSION: 1.   Innumerable number of sessile polyps measuring 2-7 mm in size were found in the cardia, gastric fundus, and gastric body These look like previous fundic gland polyps, no other abnormalites nnoted 2.   The remainder of the upper endoscopy exam was otherwise normal  RECOMMENDATIONS: Await pathology results will discuss at 5/21  visit    eSigned:  Iva Boop, MD, [facility] 06/14/2012 10:13 AM   CC:The Patient

## 2012-06-14 NOTE — Patient Instructions (Addendum)
I saw benign fundic gland polyps like last year but nothing suspicious. I took multiple biopsies and will review the results with you on 06/26/12 appointment.  I appreciate the opportunity to care for you.  Iva Boop, MD, FACG   YOU HAD AN ENDOSCOPIC PROCEDURE TODAY AT THE [facility]: Refer to the procedure report that was given to you for any specific questions about what was found during the examination.  If the procedure report does not answer your questions, please call your gastroenterologist to clarify.  If you requested that your care partner not be given the details of your procedure findings, then the procedure report has been included in a sealed envelope for you to review at your convenience later.  YOU SHOULD EXPECT: Some feelings of bloating in the abdomen. Passage of more gas than usual.  Walking can help get rid of the air that was put into your GI tract during the procedure and reduce the bloating. If you had a lower endoscopy (such as a colonoscopy or flexible sigmoidoscopy) you may notice spotting of blood in your stool or on the toilet paper. If you underwent a bowel prep for your procedure, then you may not have a normal bowel movement for a few days.  DIET: Your first meal following the procedure should be a light meal and then it is ok to progress to your normal diet.  A half-sandwich or bowl of soup is an example of a good first meal.  Heavy or fried foods are harder to digest and may make you feel nauseous or bloated.  Likewise meals heavy in dairy and vegetables can cause extra gas to form and this can also increase the bloating.  Drink plenty of fluids but you should avoid alcoholic beverages for 24 hours.     ACTIVITY: Your care partner should take you home directly after the procedure.  You should plan to take it easy, moving slowly for the rest of the day.  You can resume normal activity the day after the procedure however you should NOT DRIVE or use  heavy machinery for 24 hours (because of the sedation medicines used during the test).    SYMPTOMS TO REPORT IMMEDIATELY: A gastroenterologist can be reached at any hour.  During normal business hours, 8:30 AM to 5:00 PM Monday through Friday, call [phone].  After hours and on weekends, please call the GI answering service at [phone] who will take a message and have the physician on call contact you.    Following upper endoscopy (EGD)  Vomiting of blood or coffee ground material  New chest pain or pain under the shoulder blades  Painful or persistently difficult swallowing  New shortness of breath  Fever of 100F or higher  Black, tarry-looking stools  FOLLOW UP: If any biopsies were taken you will be contacted by phone or by letter within the next 1-3 weeks.  Call your gastroenterologist if you have not heard about the biopsies in 3 weeks.  Our staff will call the home number listed on your records the next business day following your procedure to check on you and address any questions or concerns that you may have at that time regarding the information given to you following your procedure. This is a courtesy call and so if there is no answer at the home number and we have not heard from you through the emergency physician on call, we will assume that you have returned to your regular daily activities  without incident.    SIGNATURES/CONFIDENTIALITY: You and/or your care partner have signed paperwork which will be entered into your electronic medical record.  These signatures attest to the fact that that the information above on your After Visit Summary has been reviewed and is understood.  Full responsibility of the confidentiality of this discharge information lies with you and/or your care-partner.

## 2012-06-14 NOTE — Progress Notes (Signed)
Called to room to assist during endoscopic procedure.  Patient ID and intended procedure confirmed with present staff. Received instructions for my participation in the procedure from the performing physician. ewm 

## 2012-06-14 NOTE — Progress Notes (Signed)
Pt states, "I think my allergies are acting up.  My throat and nose hurt."  Told pt to mention this to Dr. Leone Payor before procedure

## 2012-06-14 NOTE — Progress Notes (Signed)
A/ox3, pleased with MAC, report to Jane RN 

## 2012-06-17 ENCOUNTER — Telehealth: Payer: Self-pay | Admitting: *Deleted

## 2012-06-17 ENCOUNTER — Telehealth: Payer: Self-pay | Admitting: Cardiology

## 2012-06-17 NOTE — Telephone Encounter (Signed)
Spoke with patient who is concerned about bilateral ankle edema x several weeks.  She states Dr. Antoine Poche saw this at last appointment in April but did not make any medication changes.  Patient wants to know if she needs a diuretic.  I am routing to Dr. Antoine Poche for advice.

## 2012-06-17 NOTE — Telephone Encounter (Signed)
New problem    C/O both foot are swollen.

## 2012-06-17 NOTE — Telephone Encounter (Signed)
  Follow up Call-  Call back number 06/14/2012 05/16/2011 11/11/2010 09/15/2010 08/29/2010  Post procedure Call Back phone  # [phone]  Permission to leave phone message Yes Yes - - -     Patient questions:  Do you have a fever, pain , or abdominal swelling? no Pain Score  0 *  Have you tolerated food without any problems? yes  Have you been able to return to your normal activities? yes  Do you have any questions about your discharge instructions: Diet   no Medications  no Follow up visit  no  Do you have questions or concerns about your Care? no  Actions: * If pain score is 4 or above: No action needed, pain <4.

## 2012-06-18 NOTE — Telephone Encounter (Signed)
Per pt - bilateral edema in feet has been increasingly worse and she is not able to wear shoes by the end of the day.  She is reporting that she keeps her feet elevated as much as possible during the day.  States she was on Diovan/HTCZ but it was stopped because her NA was low.  Aware I will forward information to Dr Antoine Poche for review and recommendations.

## 2012-06-19 NOTE — Telephone Encounter (Signed)
Left pt a message to call back. 

## 2012-06-19 NOTE — Telephone Encounter (Signed)
Follow-up:    Patient called in returning a call.  Please call back.

## 2012-06-19 NOTE — Progress Notes (Signed)
Quick Note:  Please let her know polyps are ok - no cancer We will discuss more at rev ______

## 2012-06-19 NOTE — Telephone Encounter (Signed)
This might be related to the Norvasc at 7.5.  We have to cut it back to 5 and see how the swelling is and how her BP is.

## 2012-06-20 ENCOUNTER — Telehealth: Payer: Self-pay | Admitting: *Deleted

## 2012-06-20 NOTE — Telephone Encounter (Signed)
Pt aware of orders and will decrease Norvasc to 5 mg a day.  She will call back if swelling doesn't improve and if her BP goes up.

## 2012-06-20 NOTE — Telephone Encounter (Signed)
Patient called back and I advised her that her colon polyps were not cancerous and Dr. Leone Payor will discuss more at the follow up office visit. I gave patient her appointment date and time. Patient verbalized understanding.

## 2012-06-26 ENCOUNTER — Encounter: Payer: Self-pay | Admitting: Internal Medicine

## 2012-06-26 ENCOUNTER — Ambulatory Visit (INDEPENDENT_AMBULATORY_CARE_PROVIDER_SITE_OTHER): Payer: Medicare Other | Admitting: Internal Medicine

## 2012-06-26 VITALS — BP 108/72 | HR 60 | Ht 60.0 in | Wt 176.4 lb

## 2012-06-26 DIAGNOSIS — D3A092 Benign carcinoid tumor of the stomach: Secondary | ICD-10-CM

## 2012-06-26 DIAGNOSIS — K3184 Gastroparesis: Secondary | ICD-10-CM | POA: Diagnosis not present

## 2012-06-26 DIAGNOSIS — D131 Benign neoplasm of stomach: Secondary | ICD-10-CM

## 2012-06-26 NOTE — Patient Instructions (Addendum)
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  You do still have benign fundic gland polyps that are not typically pre-cancerous.  We will check on the polyps again in 1 year, with another endoscopy.  Today we are giving you information to read over for the domperidone.  Let us know if you want to use this.  [facility] compounds it and mails it to you.  Their phone # is [phone] if you would like to check prices. Use the generic Reglan for now.  Follow up with [place] in 6 months.  I appreciate the opportunity to care for you.   Dr. Stan Head

## 2012-06-26 NOTE — Progress Notes (Signed)
  Subjective:    [name], female    DOB: 06/12/1940, 72 y.o.   MRN: [id number]  HPI Bloated and upper pain at times - metaclopramide treats this but she is using prn because of fears of side effects Reviewed pathology from EGD - fundic gland polyps only  Medications, allergies, past medical history, past surgical history, family history and social history are reviewed and updated in the EMR.  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 metaclopramide vs domperidone - have suggested she try domperidone - she will think about it and we will see what cost is. 2. Reassurance 3. Copies of EGD and pathology to patient.

## 2012-07-12 ENCOUNTER — Encounter (HOSPITAL_COMMUNITY): Payer: Self-pay | Admitting: *Deleted

## 2012-07-12 ENCOUNTER — Emergency Department (HOSPITAL_COMMUNITY)
Admission: EM | Admit: 2012-07-12 | Discharge: 2012-07-13 | Disposition: A | Discharge status: Home / Self Care | Payer: Medicare Other | Attending: Emergency Medicine | Admitting: Emergency Medicine

## 2012-07-12 DIAGNOSIS — Z87898 Personal history of other specified conditions: Secondary | ICD-10-CM | POA: Diagnosis not present

## 2012-07-12 DIAGNOSIS — Z8719 Personal history of other diseases of the digestive system: Secondary | ICD-10-CM | POA: Diagnosis not present

## 2012-07-12 DIAGNOSIS — Z79899 Other long term (current) drug therapy: Secondary | ICD-10-CM | POA: Diagnosis not present

## 2012-07-12 DIAGNOSIS — R0602 Shortness of breath: Secondary | ICD-10-CM | POA: Insufficient documentation

## 2012-07-12 DIAGNOSIS — Z85028 Personal history of other malignant neoplasm of stomach: Secondary | ICD-10-CM | POA: Insufficient documentation

## 2012-07-12 DIAGNOSIS — I498 Other specified cardiac arrhythmias: Secondary | ICD-10-CM | POA: Diagnosis not present

## 2012-07-12 DIAGNOSIS — E119 Type 2 diabetes mellitus without complications: Secondary | ICD-10-CM | POA: Diagnosis not present

## 2012-07-12 DIAGNOSIS — F430 Acute stress reaction: Secondary | ICD-10-CM | POA: Diagnosis not present

## 2012-07-12 DIAGNOSIS — K219 Gastro-esophageal reflux disease without esophagitis: Secondary | ICD-10-CM | POA: Insufficient documentation

## 2012-07-12 DIAGNOSIS — E860 Dehydration: Secondary | ICD-10-CM | POA: Diagnosis not present

## 2012-07-12 DIAGNOSIS — Z862 Personal history of diseases of the blood and blood-forming organs and certain disorders involving the immune mechanism: Secondary | ICD-10-CM | POA: Diagnosis not present

## 2012-07-12 DIAGNOSIS — Z8679 Personal history of other diseases of the circulatory system: Secondary | ICD-10-CM | POA: Diagnosis not present

## 2012-07-12 DIAGNOSIS — Z8739 Personal history of other diseases of the musculoskeletal system and connective tissue: Secondary | ICD-10-CM | POA: Insufficient documentation

## 2012-07-12 DIAGNOSIS — Z7982 Long term (current) use of aspirin: Secondary | ICD-10-CM | POA: Diagnosis not present

## 2012-07-12 DIAGNOSIS — Z8659 Personal history of other mental and behavioral disorders: Secondary | ICD-10-CM | POA: Insufficient documentation

## 2012-07-12 DIAGNOSIS — I471 Supraventricular tachycardia, unspecified: Secondary | ICD-10-CM | POA: Insufficient documentation

## 2012-07-12 DIAGNOSIS — R000 Tachycardia, unspecified: Secondary | ICD-10-CM | POA: Insufficient documentation

## 2012-07-12 DIAGNOSIS — F439 Reaction to severe stress, unspecified: Secondary | ICD-10-CM

## 2012-07-12 DIAGNOSIS — Z8673 Personal history of transient ischemic attack (TIA), and cerebral infarction without residual deficits: Secondary | ICD-10-CM | POA: Diagnosis not present

## 2012-07-12 DIAGNOSIS — E7800 Pure hypercholesterolemia, unspecified: Secondary | ICD-10-CM | POA: Diagnosis not present

## 2012-07-12 DIAGNOSIS — I499 Cardiac arrhythmia, unspecified: Secondary | ICD-10-CM | POA: Diagnosis not present

## 2012-07-12 NOTE — ED Notes (Signed)
Pt states at 9pm tonight pt took her blood pressure at home with a reading of >200 SBP, HR >140.

## 2012-07-12 NOTE — ED Notes (Addendum)
Per EMS: pt to ED with c/o hypertension and palpatations. Pt took lopressor x3, last dose 2245. Upon EMS arrival pt's HR was sinus tach at 130. EMS gave 400 mL of NS, tried vagal maneuvers without relief. Pt denies shortness of breath, chest pain. A&Ox4, respirations equal and unlabored, skin warm and dry.

## 2012-07-13 ENCOUNTER — Emergency Department (HOSPITAL_COMMUNITY): Payer: Medicare Other

## 2012-07-13 DIAGNOSIS — R000 Tachycardia, unspecified: Secondary | ICD-10-CM | POA: Diagnosis not present

## 2012-07-13 LAB — POCT I-STAT, CHEM 8
BUN: 15 mg/dL (ref 6–23)
Calcium, Ion: 1.19 mmol/L (ref 1.13–1.30)
Chloride: 109 mEq/L (ref 96–112)
Creatinine, Ser: 0.6 mg/dL (ref 0.50–1.10)
Sodium: 141 mEq/L (ref 135–145)
TCO2: 23 mmol/L (ref 0–100)

## 2012-07-13 LAB — POCT I-STAT TROPONIN I

## 2012-07-13 MED ORDER — LACTATED RINGERS IV BOLUS (SEPSIS)
1000.0000 mL | Freq: Once | INTRAVENOUS | Status: AC
  Administered 2012-07-13: 1000 mL via INTRAVENOUS

## 2012-07-13 NOTE — ED Provider Notes (Addendum)
History     CSN: [id number]  Arrival date & time 07/12/12  2340   First MD Initiated Contact with Patient 07/13/12 0002      Chief Complaint  Patient presents with  . Hypertension  . Tachycardia   HPI [name] is a 72 y.o. female has been diagnosed with paroxysmal supraventricular tachycardia and sees Dr. Antoine Poche presents with rapid heartbeat earlier. This rapid heartbeat was associated with some mild shortness of breath, sensation of palpitations, no chest pain, no dizziness, no syncope area the patient has not been ill recently, denies any fevers, chills, productive cough, hemoptysis. No history of venous thromboembolic disease. Patient has been eating and sleeping very poorly, she's not been maintaining good by mouth hydration, her husband is very sick and in the hospital, she has been sleeping in the hospital with him.   Heart rate started going quicker about 9:00 this evening, she took an extra 25 mg metoprolol, and her symptoms began resolving after that. She said her blood pressure was elevated as well as her heart rate, but that has been resolving.  No headaches, blurred vision, double vision, numbness, tingling, dysarthria, or weakness and no chest pain.    Past Medical History  Diagnosis Date  . Gastroparesis   . Iron deficiency anemia, unspecified   . Pure hypercholesterolemia   . Headache(784.0)   . Unspecified essential hypertension   . Esophageal reflux   . Type II or unspecified type diabetes mellitus without mention of complication, not stated as uncontrolled   . Arthritis     Spinal Osteoarthritis  . Diverticulosis 08/30/2000    Colonoscopy   . Gastric polyp     Fundic Gland  . Anxiety   . Cataract   . Heart murmur     Echocardiogram 2/11: EF 60-65%, mild LAE, grade 1 diastolic dysfunction, aortic valve sclerosis, mean gradient 9 mm of mercury, PASP 34  . Stroke     tia  . Chest pain     Myoview 1/11: EF 82%, no ischemia.  Marland Kitchen PSVT (paroxysmal  supraventricular tachycardia)   . Carcinoid tumor of stomach     Past Surgical History  Procedure Laterality Date  . Cholecystectomy  1993  . Tonsillectomy    . Colonoscopy  11/11/2010    diverticulosis  . Esophagogastroduodenoscopy  08/29/2010; 09/15/2010    Carcinoid tumor less than 1 cm in July 2012 not seen in August 2012 , gastritis, fundic gland polyps  . Eus  12/15/2010    Procedure: UPPER ENDOSCOPIC ULTRASOUND (EUS) LINEAR;  Surgeon: Rob Bunting, MD;  Location: [facility];  Service: Endoscopy;  Laterality: N/A;  . Esophagogastroduodenoscopy  05/16/2011  . Esophagogastroduodenoscopy  06/14/2012    Family History  Problem Relation Age of Onset  . Diabetes Mother   . Stroke Father     deceased age 56  . Heart disease Sister     deceased MI age 35  . Heart disease Brother     deceased MI age 84  . Colon cancer Neg Hx   . Esophageal cancer Neg Hx   . Stomach cancer Neg Hx   . Rectal cancer Neg Hx     History  Substance Use Topics  . Smoking status: Never Smoker   . Smokeless tobacco: Never Used     Comment: Daily Caffeine Use -1  . Alcohol Use: No    OB History   Grav Para Term Preterm Abortions TAB SAB Ect Mult Living  Review of Systems At least 10pt or greater review of systems completed and are negative except where specified in the HPI.  Allergies  Tramadol  Home Medications   Current Outpatient Rx  Name  Route  Sig  Dispense  Refill  . amLODipine (NORVASC) 2.5 MG tablet   Oral   Take 1 tablet (2.5 mg total) by mouth at bedtime.   90 tablet   3     Needs total of 7.5 mg every day   . amLODipine (NORVASC) 5 MG tablet   Oral   Take 1 tablet (5 mg total) by mouth daily.   90 tablet   3     Needs a total of 7.5 mg a day   . aspirin 81 MG tablet   Oral   Take 81 mg by mouth daily.           . cholecalciferol (VITAMIN D) 1000 UNITS tablet   Oral   Take 1,000 Units by mouth daily.           Marland Kitchen dicyclomine (BENTYL) 10 MG  capsule   Oral   Take 1 capsule (10 mg total) by mouth every 6 (six) hours as needed (abdominal pain- may take 2 capsules if 1 ineffective).   60 capsule   0   . esomeprazole (NEXIUM) 40 MG capsule   Oral   Take 1 capsule (40 mg total) by mouth daily.   90 capsule   3     Samples given to patient    Lot#  [id number]          ...   . ESTRACE VAGINAL 0.1 MG/GM vaginal cream   Vaginal   Place vaginally as needed.          . Ferrous Sulfate (IRON) 28 MG TABS   Oral   Take by mouth daily.           . folic acid-pyridoxine-cyancobalamin (FOLBIC) 2.5-25-2 MG TABS   Oral   Take 1 tablet by mouth daily.   90 tablet   3   . HYDROcodone-acetaminophen (NORCO) 10-325 MG per tablet               . metFORMIN (GLUCOPHAGE-XR) 500 MG 24 hr tablet   Oral   Take 2 tablets (1,000 mg total) by mouth daily.   30 tablet   11   . metoCLOPramide (REGLAN) 5 MG tablet      Take one tablet by mouth before each meal and every night at bedtrime   120 tablet   3   . metoprolol tartrate (LOPRESSOR) 25 MG tablet   Oral   Take 1 tablet (25 mg total) by mouth 2 (two) times daily.   180 tablet   2   . Multiple Vitamin (MULTIVITAMIN) tablet   Oral   Take 1 tablet by mouth daily.           . Multiple Vitamins-Minerals (VISION FORMULA PO)   Oral   Take by mouth.           . ondansetron (ZOFRAN) 4 MG tablet   Oral   Take 4 mg by mouth every 4 (four) hours as needed. For nausea           . EXPIRED: potassium chloride (K-DUR,KLOR-CON) 10 MEQ tablet   Oral   Take 1 tablet (10 mEq total) by mouth 2 (two) times daily.   180 tablet   3   . rosuvastatin (CRESTOR) 10 MG tablet   Oral  Take 1 tablet (10 mg total) by mouth daily.   90 tablet   1   . valsartan (DIOVAN) 320 MG tablet   Oral   Take 1 tablet (320 mg total) by mouth daily.   90 tablet   3   . vitamin E 100 UNIT capsule   Oral   Take by mouth daily.             BP 145/101  Pulse 112  Temp(Src) 98.4 F  (36.9 C) (Oral)  Resp 22  SpO2 99%  Physical Exam  Nursing notes reviewed.  Electronic medical record reviewed. VITAL SIGNS:   Filed Vitals:   07/13/12 0130 07/13/12 0145 07/13/12 0200 07/13/12 0230  BP: 125/74 128/73 135/79 127/71  Pulse: 72 72 72 70  Temp:      TempSrc:      Resp: 22 18 17 20   SpO2: 98% 98% 96% 96%   CONSTITUTIONAL: Awake, oriented, appears non-toxic HENT: Atraumatic, normocephalic, oral mucosa pink and moist, airway patent. Nares patent without drainage. External ears normal. EYES: Conjunctiva clear, EOMI, PERRLA NECK: Trachea midline, non-tender, supple CARDIOVASCULAR: Tachycardic, Normal rhythm, No murmurs, rubs, gallops PULMONARY/CHEST: Clear to auscultation, no rhonchi, wheezes, or rales. Symmetrical breath sounds. Non-tender. ABDOMINAL: Non-distended, obese, soft, non-tender - no rebound or guarding.  BS normal. NEUROLOGIC: Non-focal, moving all four extremities, no gross sensory or motor deficits. EXTREMITIES: No clubbing, cyanosis, or edema SKIN: Warm, Dry, No erythema, No rash  ED Course  Procedures (including critical care time)  Date: 07/13/2012  Rate: 74  Rhythm: normal sinus rhythm  QRS Axis: normal  Intervals: normal  ST/T Wave abnormalities: normal  Conduction Disutrbances: none  Narrative Interpretation: No significant change from prior EKG June of 2013, nonischemic EKG  Labs Reviewed  POCT I-STAT, CHEM 8 - Abnormal; Notable for the following:    Glucose, Bld 133 (*)    Hemoglobin 11.9 (*)    HCT 35.0 (*)    All other components within normal limits  MAGNESIUM  POCT I-STAT TROPONIN I   Dg Chest Port 1 View  07/13/2012   *RADIOLOGY REPORT*  Clinical Data: Tachycardia.  PORTABLE CHEST - 1 VIEW  Comparison: Chest radiograph performed 09/02/2010  Findings: The lungs are well-aerated and clear.  There is no evidence of focal opacification, pleural effusion or pneumothorax.  The cardiomediastinal silhouette is normal in size; calcification  is noted in the aortic arch.  No acute osseous abnormalities are seen.  IMPRESSION: No acute cardiopulmonary process seen.   Original Report Authenticated By: Tonia Ghent, M.D.     1. PSVT (paroxysmal supraventricular tachycardia)   2. Stress at home   3. Mild dehydration       MDM  Patient arrives with a history of PSVT, sitting heart rate was going in the 140s with an elevated blood pressure earlier this evening, she took the metoprolol which seemed to help her heart rate. On physical exam the patient's heart rate shows a sinus tachycardia about 110-115. Based on her history, I think she is mildly dehydrated likely from stress, and not looking after herself as she is very concerned about her husband who is hospitalized and very ill.  Labs are unremarkable, troponin is negative, magnesium is normal, chest x-ray is within normal limits. EKG is unchanged from one year prior. I do not think this is acute coronary syndrome, and she is likely mildly dehydrated after a liter and a half of intravenous fluids, patient's heart rate-Returned to the normal range, in the  70s and 80s. Blood pressure also normalized. I've encouraged her to maintain her fluid intake, if her heart rate does spike up again, she can still take an extra metoprolol. She'll followup with PCP - I. think most of this is stress reaction, and dehydration  I explained the diagnosis of dehydration and PSVT and have given explicit precautions to return to the ER including sustained rapid heart rate, dizziness, syncope, chest pain or any other new or worsening symptoms. The patient understands and accepts the medical plan as it's been dictated and I have answered their questions. Discharge instructions concerning home care and prescriptions have been given.  The patient is STABLE and is discharged to home in good condition.         Jones Skene, MD 07/13/12 0559  Jones Skene, MD 07/13/12 1610

## 2012-07-25 ENCOUNTER — Other Ambulatory Visit: Payer: Self-pay

## 2012-07-25 MED ORDER — POTASSIUM CHLORIDE CRYS ER 10 MEQ PO TBCR: 10.0000 meq | EXTENDED_RELEASE_TABLET | Freq: Two times a day (BID) | ORAL | Status: DC

## 2012-07-26 ENCOUNTER — Telehealth: Payer: Self-pay | Admitting: Endocrinology

## 2012-07-26 MED ORDER — POTASSIUM CHLORIDE CRYS ER 10 MEQ PO TBCR: 10.0000 meq | EXTENDED_RELEASE_TABLET | Freq: Two times a day (BID) | ORAL | Status: DC

## 2012-07-26 NOTE — Telephone Encounter (Signed)
Patient is checking to see why Potassium Rx wasn't filled. Please contact patient.

## 2012-09-23 ENCOUNTER — Encounter: Payer: Self-pay | Admitting: Endocrinology

## 2012-09-23 ENCOUNTER — Telehealth: Payer: Self-pay | Admitting: Cardiology

## 2012-09-23 ENCOUNTER — Ambulatory Visit (INDEPENDENT_AMBULATORY_CARE_PROVIDER_SITE_OTHER): Payer: Medicare Other | Admitting: Endocrinology

## 2012-09-23 ENCOUNTER — Telehealth: Payer: Self-pay

## 2012-09-23 VITALS — BP 126/76 | HR 75 | Ht 61.0 in | Wt 175.0 lb

## 2012-09-23 DIAGNOSIS — M2550 Pain in unspecified joint: Secondary | ICD-10-CM | POA: Diagnosis not present

## 2012-09-23 DIAGNOSIS — H3400 Transient retinal artery occlusion, unspecified eye: Secondary | ICD-10-CM | POA: Diagnosis not present

## 2012-09-23 DIAGNOSIS — E119 Type 2 diabetes mellitus without complications: Secondary | ICD-10-CM | POA: Diagnosis not present

## 2012-09-23 DIAGNOSIS — G453 Amaurosis fugax: Secondary | ICD-10-CM

## 2012-09-23 LAB — BASIC METABOLIC PANEL
BUN: 19 mg/dL (ref 6–23)
Chloride: 100 mEq/L (ref 96–112)
GFR: 78.43 mL/min (ref >60.00)
Potassium: 4.1 mEq/L (ref 3.5–5.1)
Sodium: 132 mEq/L — ABNORMAL LOW (ref 135–145)

## 2012-09-23 NOTE — Progress Notes (Signed)
Subjective:    [name], female    DOB: 27-Jun-1940, 72 y.o.   MRN: [id number]  HPI 3 days ago, pt had episode of loss of vision from the left eye, x 15 minutes.  She noted associated elevation of her BP.  She also has slight numbness of the left hand.   Pt says she saw dr love 2 years ago for similar sxs.  She says she was told it was "mini-stroke."   Past Medical History  Diagnosis Date  . Gastroparesis   . Iron deficiency anemia, unspecified   . Pure hypercholesterolemia   . Headache(784.0)   . Unspecified essential hypertension   . Esophageal reflux   . Type II or unspecified type diabetes mellitus without mention of complication, not stated as uncontrolled   . Arthritis     Spinal Osteoarthritis  . Diverticulosis 08/30/2000    Colonoscopy   . Gastric polyp     Fundic Gland  . Anxiety   . Cataract   . Heart murmur     Echocardiogram 2/11: EF 60-65%, mild LAE, grade 1 diastolic dysfunction, aortic valve sclerosis, mean gradient 9 mm of mercury, PASP 34  . Stroke     tia  . Chest pain     Myoview 1/11: EF 82%, no ischemia.  Marland Kitchen PSVT (paroxysmal supraventricular tachycardia)   . Carcinoid tumor of stomach     Past Surgical History  Procedure Laterality Date  . Cholecystectomy  1993  . Tonsillectomy    . Colonoscopy  11/11/2010    diverticulosis  . Esophagogastroduodenoscopy  08/29/2010; 09/15/2010    Carcinoid tumor less than 1 cm in July 2012 not seen in August 2012 , gastritis, fundic gland polyps  . Eus  12/15/2010    Procedure: UPPER ENDOSCOPIC ULTRASOUND (EUS) LINEAR;  Surgeon: Rob Bunting, MD;  Location: [facility];  Service: Endoscopy;  Laterality: N/A;  . Esophagogastroduodenoscopy  05/16/2011  . Esophagogastroduodenoscopy  06/14/2012    History   Social History  . Marital Status: Married    Spouse Name: N/A    Number of Children: 0  . Years of Education: N/A   Occupational History  . Retail    Social History Main Topics  . Smoking  status: Never Smoker   . Smokeless tobacco: Never Used     Comment: Daily Caffeine Use -1  . Alcohol Use: No  . Drug Use: No  . Sexual Activity: Not on file   Other Topics Concern  . Not on file   Social History Narrative   One caffeine drink daily     Current Outpatient Prescriptions on File Prior to Visit  Medication Sig Dispense Refill  . amLODipine (NORVASC) 5 MG tablet Take 5 mg by mouth daily.      Marland Kitchen aspirin 81 MG tablet Take 81 mg by mouth daily.        . cholecalciferol (VITAMIN D) 1000 UNITS tablet Take 1,000 Units by mouth daily.        Marland Kitchen esomeprazole (NEXIUM) 40 MG capsule Take 1 capsule (40 mg total) by mouth daily.  90 capsule  3  . ESTRACE VAGINAL 0.1 MG/GM vaginal cream Place vaginally as needed.       . Ferrous Sulfate (IRON) 28 MG TABS Take by mouth daily.        . folic acid-pyridoxine-cyancobalamin (FOLBIC) 2.5-25-2 MG TABS Take 1 tablet by mouth daily.  90 tablet  3  . metoCLOPramide (REGLAN) 5 MG tablet Take 5 mg by  mouth 4 (four) times daily. Take one tablet by mouth before each meal and every night at bedtrime      . metoprolol tartrate (LOPRESSOR) 25 MG tablet Take 25 mg by mouth 2 (two) times daily.      . Multiple Vitamin (MULTIVITAMIN) tablet Take 1 tablet by mouth daily.        . ondansetron (ZOFRAN) 4 MG tablet Take 4 mg by mouth every 4 (four) hours as needed. For nausea        . potassium chloride (K-DUR,KLOR-CON) 10 MEQ tablet Take 1 tablet (10 mEq total) by mouth 2 (two) times daily.  180 tablet  3  . rosuvastatin (CRESTOR) 10 MG tablet Take 10 mg by mouth daily.      . valsartan (DIOVAN) 320 MG tablet Take 1 tablet (320 mg total) by mouth daily.  90 tablet  3  . vitamin E 100 UNIT capsule Take 100 Units by mouth daily.       Marland Kitchen dicyclomine (BENTYL) 10 MG capsule Take 10 mg by mouth every 6 (six) hours as needed (abdominal pain- may take 2 capsules if 1 ineffective).       No current facility-administered medications on file prior to visit.    Allergies  Allergen Reactions  . Tramadol     Dizziness     Family History  Problem Relation Age of Onset  . Diabetes Mother   . Stroke Father     deceased age 27  . Heart disease Sister     deceased MI age 21  . Heart disease Brother     deceased MI age 57  . Colon cancer Neg Hx   . Esophageal cancer Neg Hx   . Stomach cancer Neg Hx   . Rectal cancer Neg Hx    BP 126/76  Pulse 75  Ht 5\' 1"  (1.549 m)  Wt 175 lb (79.379 kg)  BMI 33.08 kg/m2  SpO2 98%  Review of Systems Denies LOC or headache.  She has nausea but no vomiting.      Objective:   Physical Exam VITAL SIGNS:  See vs page.   GENERAL: no distress. head: no deformity eyes: no periorbital swelling, no proptosis external nose and ears are normal mouth: no lesion seen Optic fundi are normal CN 2-12 intact bilaterally LUE: sensation is intact to touch Gait: normal and steady.   Lab Results  Component Value Date   HGBA1C 7.0* 09/23/2012      Assessment & Plan:  DM: Needs increased rx, if it can be done with a regimen that avoids or minimizes hypoglycemia. Numbness, recurrent, uncertain etiology Visual loss, recurrent, ? tia

## 2012-09-23 NOTE — Patient Instructions (Addendum)
blood tests are being requested for you today.  We'll contact you with results.  Refer to a neurology specialist.  you will receive a phone call, about a day and time for an appointment. Let's also check an MRI.  you will receive a phone call, about a day and time for an appointment.

## 2012-09-23 NOTE — Telephone Encounter (Addendum)
Patient states that yesterday she had sx of high BP 170/90, numbness of left hand, weakness overall, unable to see out of left eye for 15 minutes. States symptoms slowly resolved. Hx of TIA. Currently on ASA and Norvasc and Lopressor. Requesting to be seen for sx from yesterday's event. Next scheduled appt with Dr. Antoine Poche is in October.   Provided education regarding above possible stroke/TIA/neurological events and the need for emergent care when she experiences the above sx. States she does not want to go to ED. Encouraged her to call her PCP immediately to get advisement regarding followup care after yesterday's event and continued need to go to ED for emergent assessment/treatment. Patient did agree to call her PCP, Dr. Revonda Standard, immediately.  I will call patient back midday to ascertain the plan of care/treatment/recommendations by Dr. Revonda Standard. Again, provided support and advisement to complete the above plan of action to call her PCP immediately, or go to ED immediately.  Called back at 11:55am on 09/23/12. She is going to be seen by her PCP today at 1:00pm. Encouraged her to please keep her appt with Dr. Revonda Standard and to keep [place] informed of any pertinent information.

## 2012-09-23 NOTE — Telephone Encounter (Signed)
Pt BP high, 170/90 yesterday, couldn't see out of left eye for about 15 min, weak now, numbness in left hand, BP today ok,  wants to be seen today [phone]  husband has appt at [address] today for coumadin wants to come then

## 2012-09-23 NOTE — Telephone Encounter (Signed)
Pt left vociemail stating she would like to have an open mri or get a rx to relax her, she is claustrophobic [phone]

## 2012-09-24 ENCOUNTER — Other Ambulatory Visit: Payer: Self-pay

## 2012-09-24 MED ORDER — TRIAZOLAM 0.125 MG PO TABS: 0.1250 mg | ORAL_TABLET | Freq: Once | ORAL | Status: DC

## 2012-09-24 NOTE — Telephone Encounter (Signed)
i printed rx Take 1 hr prior to procedure

## 2012-09-24 NOTE — Telephone Encounter (Signed)
Pt advised.

## 2012-09-25 ENCOUNTER — Other Ambulatory Visit: Payer: Self-pay | Admitting: *Deleted

## 2012-09-25 MED ORDER — METFORMIN HCL ER 500 MG PO TB24: 1000.0000 mg | ORAL_TABLET | Freq: Two times a day (BID) | ORAL | Status: DC

## 2012-09-30 ENCOUNTER — Ambulatory Visit
Admission: RE | Admit: 2012-09-30 | Discharge: 2012-09-30 | Disposition: A | Discharge status: Home / Self Care | Payer: Medicare Other | Source: Ambulatory Visit | Attending: Endocrinology | Admitting: Endocrinology

## 2012-09-30 DIAGNOSIS — R51 Headache: Secondary | ICD-10-CM | POA: Diagnosis not present

## 2012-09-30 DIAGNOSIS — H53129 Transient visual loss, unspecified eye: Secondary | ICD-10-CM | POA: Diagnosis not present

## 2012-09-30 MED ORDER — GADOBENATE DIMEGLUMINE 529 MG/ML IV SOLN
15.0000 mL | Freq: Once | INTRAVENOUS | Status: AC | PRN
  Administered 2012-09-30: 15 mL via INTRAVENOUS

## 2012-10-09 ENCOUNTER — Other Ambulatory Visit: Payer: Self-pay | Admitting: Cardiology

## 2012-10-16 ENCOUNTER — Ambulatory Visit (INDEPENDENT_AMBULATORY_CARE_PROVIDER_SITE_OTHER): Payer: Medicare Other | Admitting: Diagnostic Neuroimaging

## 2012-10-16 ENCOUNTER — Encounter: Payer: Self-pay | Admitting: Diagnostic Neuroimaging

## 2012-10-16 VITALS — BP 141/70 | HR 68 | Temp 98.6°F | Ht 61.0 in | Wt 174.0 lb

## 2012-10-16 DIAGNOSIS — H3400 Transient retinal artery occlusion, unspecified eye: Secondary | ICD-10-CM

## 2012-10-16 DIAGNOSIS — G459 Transient cerebral ischemic attack, unspecified: Secondary | ICD-10-CM | POA: Diagnosis not present

## 2012-10-16 DIAGNOSIS — G453 Amaurosis fugax: Secondary | ICD-10-CM | POA: Insufficient documentation

## 2012-10-16 MED ORDER — CLOPIDOGREL BISULFATE 75 MG PO TABS: 75.0000 mg | ORAL_TABLET | Freq: Every day | ORAL | Status: DC

## 2012-10-16 NOTE — Progress Notes (Signed)
[facility]  PATIENT: [name] DOB: 1940/04/14  REFERRING CLINICIAN: Everardo All HISTORY FROM: patient REASON FOR VISIT: new consult   HISTORICAL  CHIEF COMPLAINT:  Chief Complaint  Patient presents with  . NP    Amaurosis fugax    rm 6    HISTORY OF PRESENT ILLNESS:   72 year old right-handed female with hypertension, diabetes, hypercholesterolemia, here for valuation of transient visual loss in left eye.  Approximately 2 weeks ago, patient had been watching television when all of a sudden everything went out of focus. She then noticed alternating white and black sensation from her left eye. This lasted for approximately 15 minutes. She says she cannot see any objects out of her left eye when she covered her right eye with her hand. She says that she remembers a "white" vision rather than fully black. She had some dizziness at that time. She checked her blood pressure which was elevated systolic blood pressure greater than 170. She also noted some left eye pain and left arm weakness during this event. Within 15 minutes symptoms had resolved. No headache during this event.  Patient has had prior episodes suspicious for TIA in the past in 2012 and 2013.  Patient sees cardiology for evaluation and management of supraventricular tachycardia episode.  REVIEW OF SYSTEMS: Full 14 system review of systems performed and notable only for fatigue murmur swelling of legs loss of vision diarrhea joint pain aching muscle runny nose numbness weakness dizziness.  ALLERGIES: Allergies  Allergen Reactions  . Tramadol     Dizziness     HOME MEDICATIONS:  Outpatient Prescriptions Prior to Visit  Medication Sig Dispense Refill  . amLODipine (NORVASC) 5 MG tablet Take one tablet by mouth daily.  90 tablet  0  . cholecalciferol (VITAMIN D) 1000 UNITS tablet Take 1,000 Units by mouth daily.        Marland Kitchen esomeprazole (NEXIUM) 40 MG capsule Take 1 capsule (40 mg total) by  mouth daily.  90 capsule  3  . ESTRACE VAGINAL 0.1 MG/GM vaginal cream Place vaginally as needed.       . Ferrous Sulfate (IRON) 28 MG TABS Take by mouth daily.        . folic acid-pyridoxine-cyancobalamin (FOLBIC) 2.5-25-2 MG TABS Take 1 tablet by mouth daily.  90 tablet  3  . metFORMIN (GLUCOPHAGE-XR) 500 MG 24 hr tablet Take 2 tablets (1,000 mg total) by mouth 2 (two) times daily.  180 tablet  1  . metoCLOPramide (REGLAN) 5 MG tablet Take 5 mg by mouth 4 (four) times daily. Take one tablet by mouth before each meal and every night at bedtrime      . metoprolol tartrate (LOPRESSOR) 25 MG tablet Take 25 mg by mouth 2 (two) times daily.      . Multiple Vitamin (MULTIVITAMIN) tablet Take 1 tablet by mouth daily.        . ondansetron (ZOFRAN) 4 MG tablet Take 4 mg by mouth every 4 (four) hours as needed. For nausea        . potassium chloride (K-DUR,KLOR-CON) 10 MEQ tablet Take 1 tablet (10 mEq total) by mouth 2 (two) times daily.  180 tablet  3  . rosuvastatin (CRESTOR) 10 MG tablet Take 10 mg by mouth daily.      . triazolam (HALCION) 0.125 MG tablet Take 1 tablet (0.125 mg total) by mouth once.  1 tablet  0  . valsartan (DIOVAN) 320 MG tablet Take 1 tablet (320 mg total) by mouth  daily.  90 tablet  3  . vitamin E 100 UNIT capsule Take 100 Units by mouth daily.       Marland Kitchen aspirin 81 MG tablet Take 81 mg by mouth daily.        Marland Kitchen dicyclomine (BENTYL) 10 MG capsule Take 10 mg by mouth every 6 (six) hours as needed (abdominal pain- may take 2 capsules if 1 ineffective).       No facility-administered medications prior to visit.    PAST MEDICAL HISTORY: Past Medical History  Diagnosis Date  . Gastroparesis   . Iron deficiency anemia, unspecified   . Pure hypercholesterolemia   . Headache(784.0)   . Unspecified essential hypertension   . Esophageal reflux   . Type II or unspecified type diabetes mellitus without mention of complication, not stated as uncontrolled   . Arthritis     Spinal  Osteoarthritis  . Diverticulosis 08/30/2000    Colonoscopy   . Gastric polyp     Fundic Gland  . Anxiety   . Cataract   . Heart murmur     Echocardiogram 2/11: EF 60-65%, mild LAE, grade 1 diastolic dysfunction, aortic valve sclerosis, mean gradient 9 mm of mercury, PASP 34  . Stroke     tia  . Chest pain     Myoview 1/11: EF 82%, no ischemia.  Marland Kitchen PSVT (paroxysmal supraventricular tachycardia)   . Carcinoid tumor of stomach     PAST SURGICAL HISTORY: Past Surgical History  Procedure Laterality Date  . Cholecystectomy  1993  . Tonsillectomy    . Colonoscopy  11/11/2010    diverticulosis  . Esophagogastroduodenoscopy  08/29/2010; 09/15/2010    Carcinoid tumor less than 1 cm in July 2012 not seen in August 2012 , gastritis, fundic gland polyps  . Eus  12/15/2010    Procedure: UPPER ENDOSCOPIC ULTRASOUND (EUS) LINEAR;  Surgeon: Rob Bunting, MD;  Location: [facility];  Service: Endoscopy;  Laterality: N/A;  . Esophagogastroduodenoscopy  05/16/2011  . Esophagogastroduodenoscopy  06/14/2012    FAMILY HISTORY: Family History  Problem Relation Age of Onset  . Diabetes Mother   . Stroke Father     deceased age 18  . Heart disease Sister     deceased MI age 21  . Heart disease Brother     deceased MI age 77  . Colon cancer Neg Hx   . Esophageal cancer Neg Hx   . Stomach cancer Neg Hx   . Rectal cancer Neg Hx     SOCIAL HISTORY:  History   Social History  . Marital Status: Married    Spouse Name: N/A    Number of Children: 0  . Years of Education: N/A   Occupational History  . Retail    Social History Main Topics  . Smoking status: Never Smoker   . Smokeless tobacco: Never Used     Comment: Daily Caffeine Use -1  . Alcohol Use: No  . Drug Use: No  . Sexual Activity: Not on file   Other Topics Concern  . Not on file   Social History Narrative   One caffeine drink daily      PHYSICAL EXAM  Filed Vitals:   10/16/12 1052  BP: 141/70  Pulse: 68  Temp: 98.6  F (37 C)  TempSrc: Oral  Height: 5\' 1"  (1.549 m)  Weight: 174 lb (78.926 kg)    Not recorded    Body mass index is 32.89 kg/(m^2).  GENERAL EXAM: Patient is in no distress  CARDIOVASCULAR: Regular rate and rhythm, no murmurs, no carotid bruits  NEUROLOGIC: MENTAL STATUS: awake, alert, language fluent, comprehension intact, naming intact CRANIAL NERVE: no papilledema on fundoscopic exam, pupils equal and reactive to light, visual fields full to confrontation, extraocular muscles intact, no nystagmus, facial sensation and strength symmetric, uvula midline, shoulder shrug symmetric, tongue midline. MOTOR: normal bulk and tone, full strength in the BUE, BLE SENSORY: normal and symmetric to light touch, pinprick, temperature, vibration COORDINATION: finger-nose-finger, fine finger movements normal REFLEXES: deep tendon reflexes present and symmetric GAIT/STATION: narrow based gait; able to walk on toes, heels and tandem; romberg is negative   DIAGNOSTIC DATA (LABS, IMAGING, TESTING) - I reviewed patient records, labs, notes, testing and imaging myself where available.  Lab Results  Component Value Date   WBC 8.9 10/20/2011   HGB 11.9* 07/13/2012   HCT 35.0* 07/13/2012   MCV 84.3 10/20/2011   PLT 224.0 10/20/2011      Component Value Date/Time   NA 132* 09/23/2012 1344   K 4.1 09/23/2012 1344   CL 100 09/23/2012 1344   CO2 23 09/23/2012 1344   GLUCOSE 124* 09/23/2012 1344   BUN 19 09/23/2012 1344   CREATININE 0.8 09/23/2012 1344   CREATININE 0.83 08/04/2011 1545   CALCIUM 9.4 09/23/2012 1344   PROT 7.6 10/20/2011 1434   ALBUMIN 4.2 10/20/2011 1434   AST 32 10/20/2011 1434   ALT 28 10/20/2011 1434   ALKPHOS 62 10/20/2011 1434   BILITOT 0.8 10/20/2011 1434   GFRNONAA >90 01/16/2011 2104   GFRAA >90 01/16/2011 2104   Lab Results  Component Value Date   CHOL 194 06/07/2012   HDL 67.00 06/07/2012   LDLCALC 98 06/07/2012   LDLDIRECT 123.7 04/13/2011   TRIG 147.0 06/07/2012   CHOLHDL 3 06/07/2012     Lab Results  Component Value Date   HGBA1C 7.0* 09/23/2012   Lab Results  Component Value Date   VITAMINB12 >1500 pg/mL* 12/14/2008   Lab Results  Component Value Date   TSH 0.90 10/20/2011    09/30/12 MRI brain -  1. No acute intracranial abnormality or significant interval  change.  2. 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 This is likely related to chronic atrophy  or congenital anomaly.   ASSESSMENT AND PLAN  72 y.o. year old female here with transient left eye visual abnormality lasting for 15 minutes. May represent TIA. We'll pursue further workup.   PLAN: - carotid u/s - TTE and cardiac monitoring (to be arranged through Dr. Jenene Slicker office) - change aspirin to plavix 75mg  daily - check esr and crp (eval for temporal arteritis)   Orders Placed This Encounter  Procedures  . US Carotid Duplex Bilateral  . Sedimentation Rate  . C-reactive Protein  . Ambulatory referral to Cardiology  . 2D Echocardiogram with contrast    Meds ordered this encounter  Medications  . clopidogrel (PLAVIX) 75 MG tablet    Sig: Take 1 tablet (75 mg total) by mouth daily.    Dispense:  90 tablet    Refill:  4    Return in about 3 months (around 01/15/2013) for with Heide Guile or Penumalli.    Suanne Marker, MD 10/16/2012, 12:10 PM Certified in Neurology, Neurophysiology and Neuroimaging  [facility] [address], [address] [place], [place] [address] [phone]

## 2012-10-16 NOTE — Patient Instructions (Signed)

## 2012-10-21 DIAGNOSIS — E119 Type 2 diabetes mellitus without complications: Secondary | ICD-10-CM | POA: Diagnosis not present

## 2012-10-21 DIAGNOSIS — H2510 Age-related nuclear cataract, unspecified eye: Secondary | ICD-10-CM | POA: Diagnosis not present

## 2012-10-22 ENCOUNTER — Telehealth: Payer: Self-pay | Admitting: Neurology

## 2012-10-22 NOTE — Telephone Encounter (Signed)
I spoke with Alma Downs, PA at [facility] In regards to patient.  She was there for an eye exam yesterday and he said the patient was confused about the carotid dopplers and lab work and asked that I call the patient to let her know where they are scheduled.  I sent a copy of Dr. Richrd Humbles office visit note to his office and he will fax a copy of their note to [place].  I left message for patient to call about getting labs at our office and carotid doppler.  Spoke to [place] and patient called her to let her know that she would be having the dopplers done at [place] and not at our office.  Left message for patient that she can come any time to have her labs drawn here, because the orders are in  system.                                                                                                                                                                                                                                                                                                                                                                                                                                                                                                                                                                                                                                                                                                                                                                                                                                                                                                                                                                                                                                                                                                                                                                                                                                                                                                                                                                                                                                                                                                                                                                                                                                                                                                                                                                                                                                                                                                                                                                                                                                                                                                                                                                                                                                                                                                                                                                                                                                                                                                                                                                                                                                                                                                                                                                                                                                                                                                                                                                                                                                                                                                                                                                                                                                                                                                                                                                                                                                                                                                                                                                                                                                                                                                                                                                                                                                                                                                                                                                                                                                                                                                                                                                                                                                                                                                                                                                                                                                                                                                                                                                                                                                                                                                                                                                                                                                                                                                                                                                                                                                                                                                                                                                                                                                                                                                                                                                                                                                                                               Marland Kitchen  [id number]                                                                                                                                                                                                                                                                                                                                                                                                                                                                                                                                                                                                                               0000000000000000000000000000000000000000000000000000000000000000000000000000000000000000000000000000000000000000000000000000000000000000000000000000000000000000000000000000000000000000000000000000000000000000000000000000000000000000000000000000000000000000000000000000000000000000000000000000000000000000000000000000000000000000000000000000000000000000000000000000000000000000000000000000000000000000000000000000000000000000[id number]...................................000000000000000000000000000000000000000000000000000000000000000000000000000000000000000000000000000000000000000000000000000000000000000000000000000000000000000000000000000000000000000000000000000000000000000000000000000000000000000000000000000000000000000000000000000000000000000000000000000000000000000000000000000000000000000000000000000000000000000000000000000000000000000000000000000000000000000000000000000000000000000000000000000000000000000000000000000000000000000000000000000000000000000000000000000000000000000000000000000000000000000000000000000000000000000000000000000000000000000000000000000000000000000000000000000000000000000000000000000000000000000000000000000000000000000000000000000000000000000000000000000000000000000000000000000000000000000000000000000000000000000000000000000000000000000000000000000000000000000000000000000000000000000000000000000000000000000000000000000000000000000000000000000000000000000000000000000000000000000000000000000000000000000000000000000000000000000000000000000000000000000000000000000000000000000000000000000000000000000000000000000000000000000000000000000000000000000000000000000000000000000000000000000000000000000000000000000000000000000000000000000000000000000000000000000000000000000000000000000000000000000000000000000000000000000000000000000000000000000000000000000000000000000 00000000000000000000000000000000000000000000000000000000000000000000000000000000000000000000000000000000000000000000000000000000000000000000000000000000000000000000000000000000000000000000000000000000000000000000000000000000000000000000000000000000000000000000000000000000000000000000000000000000000000000000000000000000000000000000000000000000000000000000000000000000000000000000000000000000000000000000000000000000000000000000000000000000000000000000000000000000000000000000000000000000000000000000000000000000000000000000000000000000000000000000000000000000000000000000000000000000000000000000000000000000000000000000000000000000000000000000000000000000000000000000000000000000000000000000000000000000000000000000000000000000000000000000000000000000000000000000000000000000000000000000000000000000000000000000000000000000000000000000000000000000000000000000000000000000000000000000000000000000000000000000000000000000000000000000000000000000000000000000000000000000000000000000000000000000000000000000000000000000000000000000000000000000000000000000000000000000000000000000000000000000000000000000000000000000000000000000000000000000000000000000000000000000000000000000000000000000000000000000000000000000[id number] 

## 2012-10-24 ENCOUNTER — Telehealth: Payer: Self-pay | Admitting: *Deleted

## 2012-10-24 DIAGNOSIS — G459 Transient cerebral ischemic attack, unspecified: Secondary | ICD-10-CM

## 2012-10-24 DIAGNOSIS — H3400 Transient retinal artery occlusion, unspecified eye: Secondary | ICD-10-CM

## 2012-10-24 NOTE — Telephone Encounter (Signed)
Have been unable to contact pt at this point however I will continue to attempt to reach her.  Looks like she needs carotids, an echo and 21 day event for atrial fib She will need a 21 day event monitor. The patients symptoms necessitate an event monitor. The symptoms are too infrequent to be identified on a holter monitor. -----    Message ----- From: Suanne Marker, MD Sent: 10/16/2012 12:11 PM To: Romero Belling, MD, Rollene Rotunda, MD Thank you for referring this interesting and pleasant patient. I have included my office note and please feel free to contact me with any questions or concerns. Sincerely, Suanne Marker, MD 10/16/2012, 12:11 PM Certified in Neurology, Neurophysiology and Neuroimaging [facility] [address] [place], [place] [address] [phone]

## 2012-10-25 ENCOUNTER — Other Ambulatory Visit: Payer: Self-pay

## 2012-10-25 ENCOUNTER — Other Ambulatory Visit: Payer: Self-pay | Admitting: *Deleted

## 2012-10-25 ENCOUNTER — Telehealth: Payer: Self-pay | Admitting: *Deleted

## 2012-10-25 ENCOUNTER — Telehealth: Payer: Self-pay | Admitting: Diagnostic Neuroimaging

## 2012-10-25 ENCOUNTER — Ambulatory Visit (HOSPITAL_COMMUNITY): Payer: Medicare Other | Attending: Diagnostic Neuroimaging

## 2012-10-25 ENCOUNTER — Encounter (INDEPENDENT_AMBULATORY_CARE_PROVIDER_SITE_OTHER): Payer: Medicare Other

## 2012-10-25 DIAGNOSIS — I079 Rheumatic tricuspid valve disease, unspecified: Secondary | ICD-10-CM | POA: Insufficient documentation

## 2012-10-25 DIAGNOSIS — I379 Nonrheumatic pulmonary valve disorder, unspecified: Secondary | ICD-10-CM | POA: Diagnosis not present

## 2012-10-25 DIAGNOSIS — R42 Dizziness and giddiness: Secondary | ICD-10-CM | POA: Diagnosis not present

## 2012-10-25 DIAGNOSIS — I059 Rheumatic mitral valve disease, unspecified: Secondary | ICD-10-CM | POA: Insufficient documentation

## 2012-10-25 DIAGNOSIS — G459 Transient cerebral ischemic attack, unspecified: Secondary | ICD-10-CM

## 2012-10-25 DIAGNOSIS — I359 Nonrheumatic aortic valve disorder, unspecified: Secondary | ICD-10-CM | POA: Diagnosis not present

## 2012-10-25 DIAGNOSIS — H3400 Transient retinal artery occlusion, unspecified eye: Secondary | ICD-10-CM

## 2012-10-25 DIAGNOSIS — G453 Amaurosis fugax: Secondary | ICD-10-CM

## 2012-10-25 NOTE — Telephone Encounter (Signed)
Spoke with pt and informed her that it will not be necessary to fast before her labs.

## 2012-10-25 NOTE — Progress Notes (Signed)
Echocardiogram performed by Matt LeBeau  

## 2012-10-25 NOTE — Telephone Encounter (Signed)
Pt aware and is scheduled for echo today at 11:30 am.  An order will be placed for the carotid and 21 day event monitor.  Pt will stop at check out when she has completed the echo to schedule the other testing.

## 2012-10-25 NOTE — Telephone Encounter (Signed)
I left message with patient to call our office about getting her labs drawn and about carotid doppler.

## 2012-10-25 NOTE — Telephone Encounter (Signed)
21 day event monitor placed on Pt 10/25/12 TK 

## 2012-10-28 ENCOUNTER — Other Ambulatory Visit (INDEPENDENT_AMBULATORY_CARE_PROVIDER_SITE_OTHER): Payer: Self-pay

## 2012-10-28 DIAGNOSIS — G459 Transient cerebral ischemic attack, unspecified: Secondary | ICD-10-CM | POA: Diagnosis not present

## 2012-10-28 DIAGNOSIS — Z0289 Encounter for other administrative examinations: Secondary | ICD-10-CM

## 2012-10-28 DIAGNOSIS — H3400 Transient retinal artery occlusion, unspecified eye: Secondary | ICD-10-CM | POA: Diagnosis not present

## 2012-10-29 ENCOUNTER — Encounter (INDEPENDENT_AMBULATORY_CARE_PROVIDER_SITE_OTHER): Payer: Medicare Other

## 2012-10-29 DIAGNOSIS — G459 Transient cerebral ischemic attack, unspecified: Secondary | ICD-10-CM

## 2012-10-29 DIAGNOSIS — H53129 Transient visual loss, unspecified eye: Secondary | ICD-10-CM

## 2012-10-29 DIAGNOSIS — H3400 Transient retinal artery occlusion, unspecified eye: Secondary | ICD-10-CM

## 2012-10-29 DIAGNOSIS — I6529 Occlusion and stenosis of unspecified carotid artery: Secondary | ICD-10-CM

## 2012-10-29 LAB — SEDIMENTATION RATE: Sed Rate: 31 mm/hr (ref 0–40)

## 2012-10-31 ENCOUNTER — Telehealth: Payer: Self-pay | Admitting: Diagnostic Neuroimaging

## 2012-11-01 DIAGNOSIS — H3400 Transient retinal artery occlusion, unspecified eye: Secondary | ICD-10-CM | POA: Diagnosis not present

## 2012-11-01 DIAGNOSIS — H53129 Transient visual loss, unspecified eye: Secondary | ICD-10-CM | POA: Diagnosis not present

## 2012-11-01 NOTE — Telephone Encounter (Signed)
Patient requesting lab results

## 2012-11-04 ENCOUNTER — Other Ambulatory Visit: Payer: Self-pay | Admitting: Cardiology

## 2012-11-04 ENCOUNTER — Telehealth: Payer: Self-pay | Admitting: *Deleted

## 2012-11-04 NOTE — Telephone Encounter (Signed)
Spoke to patient and relayed normal labs.

## 2012-11-04 NOTE — Telephone Encounter (Signed)
Please call with Lab results.

## 2012-11-07 ENCOUNTER — Ambulatory Visit (INDEPENDENT_AMBULATORY_CARE_PROVIDER_SITE_OTHER): Payer: Medicare Other | Admitting: Cardiology

## 2012-11-07 ENCOUNTER — Encounter: Payer: Self-pay | Admitting: Cardiology

## 2012-11-07 VITALS — BP 116/68 | HR 71 | Ht 61.0 in | Wt 175.0 lb

## 2012-11-07 DIAGNOSIS — G459 Transient cerebral ischemic attack, unspecified: Secondary | ICD-10-CM

## 2012-11-07 DIAGNOSIS — R079 Chest pain, unspecified: Secondary | ICD-10-CM | POA: Diagnosis not present

## 2012-11-07 DIAGNOSIS — I10 Essential (primary) hypertension: Secondary | ICD-10-CM | POA: Diagnosis not present

## 2012-11-07 NOTE — Patient Instructions (Addendum)
The current medical regimen is effective;  continue present plan and medications.  Follow up in 6 months with Dr Hochrein.  You will receive a letter in the mail 2 months before you are due.  Please call us when you receive this letter to schedule your follow up appointment.  

## 2012-11-07 NOTE — Progress Notes (Signed)
HPI The patient presents for followup of difficult to control hypertension. Since I last saw her she has had a complete neurologic workup for an episode of transient visual disturbance in her left eye. I have reviewed these records. MRI demonstrated no acute findings. Echo was essentially unremarkable. She had some minimal carotid plaquing. She's currently wearing an event monitor. She has been switched from aspirin to Plavix. She denies any chest pressure, neck or arm discomfort. She's not noticed any PND or orthopnea. She has some rare palpitations. These are not associated with any other symptoms. She's had no further visual disturbance.she says her blood pressure has actually been well controlled. She's had none of the events that was her previous SVT.   Allergies  Allergen Reactions  . Tramadol     Dizziness     Current Outpatient Prescriptions  Medication Sig Dispense Refill  . amLODipine (NORVASC) 5 MG tablet Take one tablet by mouth daily.  90 tablet  0  . cholecalciferol (VITAMIN D) 1000 UNITS tablet Take 1,000 Units by mouth daily.        . clopidogrel (PLAVIX) 75 MG tablet Take 1 tablet (75 mg total) by mouth daily.  90 tablet  4  . DIOVAN 320 MG tablet Take 1 tablet by mouth  daily  90 tablet  0  . esomeprazole (NEXIUM) 40 MG capsule Take 1 capsule (40 mg total) by mouth daily.  90 capsule  3  . ESTRACE VAGINAL 0.1 MG/GM vaginal cream Place vaginally as needed.       . Ferrous Sulfate (IRON) 28 MG TABS Take by mouth daily.        . folic acid-pyridoxine-cyancobalamin (FOLBIC) 2.5-25-2 MG TABS Take 1 tablet by mouth daily.  90 tablet  3  . metFORMIN (GLUCOPHAGE-XR) 500 MG 24 hr tablet Take 2 tablets (1,000 mg total) by mouth 2 (two) times daily.  180 tablet  1  . metoCLOPramide (REGLAN) 5 MG tablet Take 5 mg by mouth 4 (four) times daily. Take one tablet by mouth before each meal and every night at bedtrime      . metoprolol tartrate (LOPRESSOR) 25 MG tablet Take 25 mg by mouth 2  (two) times daily.      . Multiple Vitamin (MULTIVITAMIN) tablet Take 1 tablet by mouth daily.        . ondansetron (ZOFRAN) 4 MG tablet Take 4 mg by mouth every 4 (four) hours as needed. For nausea        . potassium chloride (K-DUR,KLOR-CON) 10 MEQ tablet Take 1 tablet (10 mEq total) by mouth 2 (two) times daily.  180 tablet  3  . rosuvastatin (CRESTOR) 10 MG tablet Take 10 mg by mouth daily.      . triazolam (HALCION) 0.125 MG tablet Take 1 tablet (0.125 mg total) by mouth once.  1 tablet  0  . vitamin E 100 UNIT capsule Take 100 Units by mouth daily.        No current facility-administered medications for this visit.    Past Medical History  Diagnosis Date  . Gastroparesis   . Iron deficiency anemia, unspecified   . Pure hypercholesterolemia   . Headache(784.0)   . Unspecified essential hypertension   . Esophageal reflux   . Type II or unspecified type diabetes mellitus without mention of complication, not stated as uncontrolled   . Arthritis     Spinal Osteoarthritis  . Diverticulosis 08/30/2000    Colonoscopy   . Gastric polyp  Fundic Gland  . Anxiety   . Cataract   . Heart murmur     Echocardiogram 2/11: EF 60-65%, mild LAE, grade 1 diastolic dysfunction, aortic valve sclerosis, mean gradient 9 mm of mercury, PASP 34  . Stroke     tia  . Chest pain     Myoview 1/11: EF 82%, no ischemia.  Marland Kitchen PSVT (paroxysmal supraventricular tachycardia)   . Carcinoid tumor of stomach     Past Surgical History  Procedure Laterality Date  . Cholecystectomy  1993  . Tonsillectomy    . Colonoscopy  11/11/2010    diverticulosis  . Esophagogastroduodenoscopy  08/29/2010; 09/15/2010    Carcinoid tumor less than 1 cm in July 2012 not seen in August 2012 , gastritis, fundic gland polyps  . Eus  12/15/2010    Procedure: UPPER ENDOSCOPIC ULTRASOUND (EUS) LINEAR;  Surgeon: Rob Bunting, MD;  Location: [facility];  Service: Endoscopy;  Laterality: N/A;  . Esophagogastroduodenoscopy  05/16/2011   . Esophagogastroduodenoscopy  06/14/2012    ROS:  As stated in the HPI and negative for all other systems.  PHYSICAL EXAM BP 116/68  Pulse 71  Ht 5\' 1"  (1.549 m)  Wt 175 lb (79.379 kg)  BMI 33.08 kg/m2 GENERAL:  Well appearing HEENT:  Pupils equal round and reactive, fundi not visualized, oral mucosa unremarkable NECK:  No jugular venous distention, waveform within normal limits, carotid upstroke brisk and symmetric, no bruits, no thyromegaly LYMPHATICS:  No cervical, inguinal adenopathy LUNGS:  Clear to auscultation bilaterally BACK:  No CVA tenderness CHEST:  Unremarkable HEART:  PMI not displaced or sustained,S1 and S2 within normal limits, no S3, no S4, no clicks, no rubs, slight systolic murmur peaking and heard best at the right upper sternal border, no diastolicmurmurs ABD:  Flat, positive bowel sounds normal in frequency in pitch, no bruits, no rebound, no guarding, no midline pulsatile mass, no hepatomegaly, no splenomegaly EXT:  2 plus pulses throughout, mild bilateral ankle edema, no cyanosis no clubbing  EKG:  Sinus rhythm, rate 71, axis within normal limits, intervals within normal limits, no acute ST-T wave changes. 11/07/2012  ASSESSMENT AND PLAN  Hypertension - Her blood pressure is improved. We try a lower dose of Norvasc at one point in time because of some mild edema. However, her blood pressure didn't tolerate this. She will remain on the meds as listed.  Supraventricular Tachycardia - She has had no further events consistent with this and will continue the event monitor for the minor palpitations she is describing.  Dyslipidemia -  She will remain on the meds as listed.  Chest pain -  She has had no new symptoms.  No further testing is indicated.   Carotid stenosis - She has some mild plaque and will be checked again in one year.

## 2012-11-12 ENCOUNTER — Telehealth: Payer: Self-pay | Admitting: Internal Medicine

## 2012-11-12 NOTE — Telephone Encounter (Signed)
Ok - I have no objections

## 2012-11-12 NOTE — Telephone Encounter (Signed)
Patient advised.

## 2012-11-12 NOTE — Telephone Encounter (Signed)
Patient wants you to know that she has been started on plavix for a presumed TIA.  She wanted to make sure that Dr. Leone Payor was aware and he was ok with her taking it.

## 2012-11-20 ENCOUNTER — Telehealth: Payer: Self-pay

## 2012-11-20 NOTE — Telephone Encounter (Signed)
called patient about montior results

## 2012-11-25 ENCOUNTER — Other Ambulatory Visit: Payer: Self-pay

## 2012-11-25 DIAGNOSIS — E7800 Pure hypercholesterolemia, unspecified: Secondary | ICD-10-CM

## 2012-11-25 MED ORDER — ESOMEPRAZOLE MAGNESIUM 40 MG PO CPDR: 40.0000 mg | DELAYED_RELEASE_CAPSULE | Freq: Every day | ORAL | Status: DC

## 2012-11-25 MED ORDER — ROSUVASTATIN CALCIUM 10 MG PO TABS: 10.0000 mg | ORAL_TABLET | Freq: Every day | ORAL | Status: DC

## 2012-11-25 NOTE — Telephone Encounter (Signed)
Refilled Crestor to [facility]

## 2012-11-26 ENCOUNTER — Other Ambulatory Visit: Payer: Self-pay | Admitting: *Deleted

## 2012-11-26 DIAGNOSIS — E7800 Pure hypercholesterolemia, unspecified: Secondary | ICD-10-CM

## 2012-11-26 MED ORDER — ROSUVASTATIN CALCIUM 10 MG PO TABS: 10.0000 mg | ORAL_TABLET | Freq: Every day | ORAL | Status: DC

## 2012-11-27 ENCOUNTER — Telehealth: Payer: Self-pay | Admitting: *Deleted

## 2012-11-27 NOTE — Telephone Encounter (Signed)
Thank you for your assistance.  I will look forward to seeing the patient as soon as possible.

## 2012-11-27 NOTE — Telephone Encounter (Signed)
Call to patient and advised that she see PCP for discomfort until we can get her records for MD review.  Certainly do not want patient to have to wait while she is experiencing bloating and discomfort.  Patient states she signed for records to be sent to [place] yesterday and she is agreeable to call PCP.     If agree, please sign and close encounter. Thank you.

## 2012-11-29 ENCOUNTER — Encounter: Payer: Self-pay | Admitting: Cardiology

## 2012-11-29 ENCOUNTER — Encounter: Payer: Self-pay | Admitting: Endocrinology

## 2012-11-29 ENCOUNTER — Ambulatory Visit (INDEPENDENT_AMBULATORY_CARE_PROVIDER_SITE_OTHER): Payer: Medicare Other | Admitting: Endocrinology

## 2012-11-29 VITALS — BP 140/80 | HR 76 | Temp 97.8°F | Wt 176.0 lb

## 2012-11-29 DIAGNOSIS — E119 Type 2 diabetes mellitus without complications: Secondary | ICD-10-CM

## 2012-11-29 DIAGNOSIS — M79641 Pain in right hand: Secondary | ICD-10-CM

## 2012-11-29 DIAGNOSIS — M79609 Pain in unspecified limb: Secondary | ICD-10-CM | POA: Diagnosis not present

## 2012-11-29 LAB — HEMOGLOBIN A1C: Hgb A1c MFr Bld: 6.8 % — ABNORMAL HIGH (ref 4.6–6.5)

## 2012-11-29 MED ORDER — PANTOPRAZOLE SODIUM 40 MG PO TBEC: 40.0000 mg | DELAYED_RELEASE_TABLET | Freq: Every day | ORAL | Status: DC

## 2012-11-29 NOTE — Progress Notes (Signed)
Subjective:    [name], female    DOB: [birth year], 72 y.o.   MRN: [id number]  HPI The state of at least three ongoing medical problems is addressed today, with interval history of each noted here: Pt returns for f/u of type 2 DM (dx'ed 2004; complicated by TIA).   No further episodes of TIA on plavix.  Pt is concerned about drug-drug interaction between plavix and nexium. Past Medical History  Diagnosis Date  . Gastroparesis   . Iron deficiency anemia, unspecified   . Pure hypercholesterolemia   . Headache(784.0)   . Unspecified essential hypertension   . Esophageal reflux   . Type II or unspecified type diabetes mellitus without mention of complication, not stated as uncontrolled   . Arthritis     Spinal Osteoarthritis  . Diverticulosis 08/30/2000    Colonoscopy   . Gastric polyp     Fundic Gland  . Anxiety   . Cataract   . Heart murmur     Echocardiogram 2/11: EF 60-65%, mild LAE, grade 1 diastolic dysfunction, aortic valve sclerosis, mean gradient 9 mm of mercury, PASP 34  . Stroke     tia  . Chest pain     Myoview 1/11: EF 82%, no ischemia.  Marland Kitchen PSVT (paroxysmal supraventricular tachycardia)   . Carcinoid tumor of stomach     Past Surgical History  Procedure Laterality Date  . Cholecystectomy  1993  . Tonsillectomy    . Colonoscopy  11/11/2010    diverticulosis  . Esophagogastroduodenoscopy  08/29/2010; 09/15/2010    Carcinoid tumor less than 1 cm in July 2012 not seen in August 2012 , gastritis, fundic gland polyps  . Eus  12/15/2010    Procedure: UPPER ENDOSCOPIC ULTRASOUND (EUS) LINEAR;  Surgeon: Rob Bunting, MD;  Location: [facility];  Service: Endoscopy;  Laterality: N/A;  . Esophagogastroduodenoscopy  05/16/2011  . Esophagogastroduodenoscopy  06/14/2012    History   Social History  . Marital Status: Married    Spouse Name: N/A    Number of Children: 0  . Years of Education: N/A   Occupational History  . Retail    Social History  Main Topics  . Smoking status: Never Smoker   . Smokeless tobacco: Never Used     Comment: Daily Caffeine Use -1  . Alcohol Use: No  . Drug Use: No  . Sexual Activity: Not on file   Other Topics Concern  . Not on file   Social History Narrative   One caffeine drink daily     Current Outpatient Prescriptions on File Prior to Visit  Medication Sig Dispense Refill  . amLODipine (NORVASC) 5 MG tablet Take one tablet by mouth daily.  90 tablet  0  . cholecalciferol (VITAMIN D) 1000 UNITS tablet Take 1,000 Units by mouth daily.        . clopidogrel (PLAVIX) 75 MG tablet Take 1 tablet (75 mg total) by mouth daily.  90 tablet  4  . DIOVAN 320 MG tablet Take 1 tablet by mouth  daily  90 tablet  0  . ESTRACE VAGINAL 0.1 MG/GM vaginal cream Place vaginally as needed.       . Ferrous Sulfate (IRON) 28 MG TABS Take by mouth daily.        . folic acid-pyridoxine-cyancobalamin (FOLBIC) 2.5-25-2 MG TABS Take 1 tablet by mouth daily.  90 tablet  3  . metFORMIN (GLUCOPHAGE-XR) 500 MG 24 hr tablet Take 2 tablets (1,000 mg total) by mouth 2 (  two) times daily.  180 tablet  1  . metoCLOPramide (REGLAN) 5 MG tablet Take 5 mg by mouth 4 (four) times daily. Take one tablet by mouth before each meal and every night at bedtrime      . metoprolol tartrate (LOPRESSOR) 25 MG tablet Take 25 mg by mouth 2 (two) times daily.      . Multiple Vitamin (MULTIVITAMIN) tablet Take 1 tablet by mouth daily.        . ondansetron (ZOFRAN) 4 MG tablet Take 4 mg by mouth every 4 (four) hours as needed. For nausea        . potassium chloride (K-DUR,KLOR-CON) 10 MEQ tablet Take 1 tablet (10 mEq total) by mouth 2 (two) times daily.  180 tablet  3  . rosuvastatin (CRESTOR) 10 MG tablet Take 1 tablet (10 mg total) by mouth daily.  30 tablet  11  . triazolam (HALCION) 0.125 MG tablet Take 1 tablet (0.125 mg total) by mouth once.  1 tablet  0  . vitamin E 100 UNIT capsule Take 100 Units by mouth daily.        No current  facility-administered medications on file prior to visit.    Allergies  Allergen Reactions  . Tramadol     Dizziness     Family History  Problem Relation Age of Onset  . Diabetes Mother   . Stroke Father     deceased age 3  . Heart disease Sister     deceased MI age 6  . Heart disease Brother     deceased MI age 60  . Colon cancer Neg Hx   . Esophageal cancer Neg Hx   . Stomach cancer Neg Hx   . Rectal cancer Neg Hx     BP 140/80  Pulse 76  Temp(Src) 97.8 F (36.6 C)  Wt 176 lb (79.833 kg)  BMI 33.27 kg/m2  SpO2 98%  Review of Systems She has slight sore throat since she stopped nexium.  Right middle finger pain persists    Objective:   Physical Exam head: no deformity eyes: no periorbital swelling, no proptosis external nose and ears are normal mouth: no lesion seen Right middle finger: slight swelling.  Lab Results  Component Value Date   HGBA1C 6.8* 11/29/2012      Assessment & Plan:   Persistent finger pain: Refer hand specialist. GERD: possibility of drug-drug interaction between nexium and plavix Sore throat: i advised pt to see if the PPI helps.

## 2012-11-29 NOTE — Patient Instructions (Addendum)
Please come back for a "medicare wellness" appointment in 3 months. i have sent a prescription to your pharmacy, to change the nexium to a different pill.   blood tests are being requested for you today.  We'll contact you with results.

## 2012-12-02 ENCOUNTER — Telehealth: Payer: Self-pay | Admitting: Internal Medicine

## 2012-12-02 NOTE — Telephone Encounter (Signed)
I have spoken to patient. It appears that Dr Everardo All has already given her a prescription for Protonix to take in place of Nexium. I have advised her that it will be fine for her to take the Protonix in place of Nexium. I have also advised that she is due for an office visit in November 2014 (as per Dr Marvell Fuller last office note in 06/2012). Patient has scheduled an appointment with Dr Leone Payor for December 2014.

## 2012-12-02 NOTE — Progress Notes (Signed)
Quick Note:  Called and spoke with pt and pt is aware. ______ 

## 2012-12-03 DIAGNOSIS — H2510 Age-related nuclear cataract, unspecified eye: Secondary | ICD-10-CM | POA: Diagnosis not present

## 2012-12-03 DIAGNOSIS — E119 Type 2 diabetes mellitus without complications: Secondary | ICD-10-CM | POA: Diagnosis not present

## 2012-12-03 NOTE — Telephone Encounter (Signed)
Patient now scheduled for 12-25-12.

## 2012-12-11 DIAGNOSIS — H00029 Hordeolum internum unspecified eye, unspecified eyelid: Secondary | ICD-10-CM | POA: Diagnosis not present

## 2012-12-13 NOTE — Telephone Encounter (Signed)
ESR, CRP normal. -VRP

## 2012-12-17 DIAGNOSIS — H00029 Hordeolum internum unspecified eye, unspecified eyelid: Secondary | ICD-10-CM | POA: Diagnosis not present

## 2012-12-19 NOTE — Telephone Encounter (Signed)
I called patient 3 times. No answer. -VRP

## 2012-12-25 ENCOUNTER — Encounter: Payer: Self-pay | Admitting: Obstetrics and Gynecology

## 2012-12-25 ENCOUNTER — Ambulatory Visit (INDEPENDENT_AMBULATORY_CARE_PROVIDER_SITE_OTHER): Payer: Medicare Other | Admitting: Obstetrics and Gynecology

## 2012-12-25 VITALS — BP 120/72 | HR 70 | Ht 61.5 in | Wt 175.0 lb

## 2012-12-25 DIAGNOSIS — R319 Hematuria, unspecified: Secondary | ICD-10-CM

## 2012-12-25 DIAGNOSIS — R1032 Left lower quadrant pain: Secondary | ICD-10-CM

## 2012-12-25 DIAGNOSIS — N952 Postmenopausal atrophic vaginitis: Secondary | ICD-10-CM

## 2012-12-25 DIAGNOSIS — M899 Disorder of bone, unspecified: Secondary | ICD-10-CM | POA: Diagnosis not present

## 2012-12-25 DIAGNOSIS — R141 Gas pain: Secondary | ICD-10-CM

## 2012-12-25 DIAGNOSIS — Z1239 Encounter for other screening for malignant neoplasm of breast: Secondary | ICD-10-CM

## 2012-12-25 DIAGNOSIS — R140 Abdominal distension (gaseous): Secondary | ICD-10-CM

## 2012-12-25 DIAGNOSIS — M8580 Other specified disorders of bone density and structure, unspecified site: Secondary | ICD-10-CM

## 2012-12-25 LAB — POCT URINALYSIS DIPSTICK
Bilirubin, UA: NEGATIVE
Glucose, UA: NEGATIVE
Ketones, UA: NEGATIVE
Leukocytes, UA: NEGATIVE
Nitrite, UA: NEGATIVE
Protein, UA: NEGATIVE
pH, UA: 5

## 2012-12-25 NOTE — Patient Instructions (Signed)
Try over the counter hydrocortisone cream for external burning and itching.  Try the vaginal vitamin E suppositories for vaginal dryness symptoms.  You can but this on [email or web address].  Please discuss vaginal estrogen therapy with your neurologist to determine if it is safe to resume this treatment.  I would like to have you stop this until that time.

## 2012-12-25 NOTE — Progress Notes (Signed)
[name], female   DOB: Oct 07, 1940, 72 y.o.   MRN: [id number] GYNECOLOGY PROBLEM VISIT  PCP:   Romero Belling, MD  Referring provider:   HPI: 72 y.o.   Married  Caucasian  female   G0P0 with Patient's last menstrual period was 02/07/1992.   here for   Vaginal/vulvar irritation and abdominal bloating.  Uses vaginal estrogen to treat dryness.  The cream causes little bit or burning but it does help with dryness.  No vaginal itching or odor.    Notes abdominal bloating for the last 5 - 6 months.  Also has left inguinal/pelvic pain. Had a CT of abdomen and pelvis two years ago and diverticulosis was noted.  Pelvic ultrasound in 2011 - Uterus with 1.7 cm posterior midbody fibroid and endometrium showing a 3 x 3 x 5 mm cyst (not considered significant.)  Normal ovaries. History of IBS, gastric polyp and carcinoid of stomach, and gastroparesis.  Has diarrhea and constipation.   Patient had a TIA two months ago.  Placed on Plavix but has stopped this.  Taking an aspirin instead. She needs to see her PCP again, Dr. Everardo All.  Had a brain MRI and this showed no acute changes.  Patient treated for diabetes.  Last HgbA1C is 7.0.  Wants to have a bone density. States she has a history of osteopenia. Last bone density was several years.   Urine - trace of RBCs (reports urinary urgency).  GYNECOLOGIC HISTORY: Patient's last menstrual period was 02/07/1992. Sexually active:  no Partner preference: female Contraception:   postmenopausal Menopausal hormone therapy: no DES exposure:  no  Blood transfusions:   no Sexually transmitted diseases:   no GYN Procedures:  Laparoscopy 1993--benign(incidentally had gallbladder removed) Mammogram:    04-10-12 [facility]             Pap:   07-13-10 wnl History of abnormal pap smear:  15-20 years ago had 1 abnormal pap smear.  No colposcopy or treatment.  Repeat pap smear was normal.   OB History   Grav Para Term Preterm  Abortions TAB SAB Ect Mult Living   0                  Family History  Problem Relation Age of Onset  . Diabetes Mother   . Stroke Father     deceased age 48  . Heart disease Sister     deceased MI age 69  . Heart disease Brother     deceased MI age 21  . Colon cancer Neg Hx   . Esophageal cancer Neg Hx   . Stomach cancer Neg Hx   . Rectal cancer Neg Hx     Patient Active Problem List   Diagnosis Date Noted  . Amaurosis fugax of left eye 10/16/2012  . Amaurosis fugax 09/23/2012  . Low back pain 06/03/2012  . Vitamin D deficiency 03/11/2012  . Headache 01/28/2012  . Bilateral hand pain 10/20/2011  . Encounter for long-term (current) use of other medications 10/20/2011  . IBS (irritable bowel syndrome) 08/14/2011  . TIA (transient ischemic attack) 02/10/2011  . Abnormal brain MRI 01/19/2011  . Allergic rhinitis 10/01/2010  . Myalgia 09/30/2010  . Carcinoid tumor of stomach 09/29/2010  . Left leg pain 07/15/2010  . FUNDIC GLAND POLYPS OF STOMACH 03/18/2010  . EPIGASTRIC PAIN 03/18/2010  . ARTHRALGIA 03/17/2009  . SYSTOLIC MURMUR 03/02/2009  . SUPRAVENTRICULAR TACHYCARDIA 01/12/2009  . PLANTAR FASCIITIS 06/08/2008  . CHEST PAIN 05/18/2008  .  ANEMIA, IRON DEFICIENCY 12/18/2007  . Gastroparesis 12/18/2007  . HYPERCHOLESTEROLEMIA 06/11/2007  . DIABETES MELLITUS, TYPE II 09/05/2006  . HYPERTENSION 09/05/2006  . GERD 09/05/2006    Past Medical History  Diagnosis Date  . Gastroparesis   . Iron deficiency anemia, unspecified   . Pure hypercholesterolemia   . Headache(784.0)   . Unspecified essential hypertension   . Esophageal reflux   . Type II or unspecified type diabetes mellitus without mention of complication, not stated as uncontrolled   . Arthritis     Spinal Osteoarthritis  . Diverticulosis 08/30/2000    Colonoscopy   . Gastric polyp     Fundic Gland  . Anxiety   . Cataract   . Heart murmur     Echocardiogram 2/11: EF 60-65%, mild LAE, grade 1  diastolic dysfunction, aortic valve sclerosis, mean gradient 9 mm of mercury, PASP 34  . Stroke     tia  . Chest pain     Myoview 1/11: EF 82%, no ischemia.  Marland Kitchen PSVT (paroxysmal supraventricular tachycardia)   . Carcinoid tumor of stomach     Past Surgical History  Procedure Laterality Date  . Cholecystectomy  1993  . Tonsillectomy    . Colonoscopy  11/11/2010    diverticulosis  . Esophagogastroduodenoscopy  08/29/2010; 09/15/2010    Carcinoid tumor less than 1 cm in July 2012 not seen in August 2012 , gastritis, fundic gland polyps  . Eus  12/15/2010    Procedure: UPPER ENDOSCOPIC ULTRASOUND (EUS) LINEAR;  Surgeon: Rob Bunting, MD;  Location: [facility];  Service: Endoscopy;  Laterality: N/A;  . Esophagogastroduodenoscopy  05/16/2011  . Esophagogastroduodenoscopy  06/14/2012    ALLERGIES: Tramadol  Current Outpatient Prescriptions  Medication Sig Dispense Refill  . amLODipine (NORVASC) 5 MG tablet Take one tablet by mouth daily.  90 tablet  0  . AZASITE 1 % ophthalmic solution Place 1 drop into the right eye daily.      . cholecalciferol (VITAMIN D) 1000 UNITS tablet Take 1,000 Units by mouth daily.        Marland Kitchen DIOVAN 320 MG tablet Take 1 tablet by mouth  daily  90 tablet  0  . ESTRACE VAGINAL 0.1 MG/GM vaginal cream Place vaginally as needed.       . Ferrous Sulfate (IRON) 28 MG TABS Take by mouth daily.        . folic acid-pyridoxine-cyancobalamin (FOLBIC) 2.5-25-2 MG TABS Take 1 tablet by mouth daily.  90 tablet  3  . metFORMIN (GLUCOPHAGE-XR) 500 MG 24 hr tablet Take 2 tablets (1,000 mg total) by mouth 2 (two) times daily.  180 tablet  1  . metoCLOPramide (REGLAN) 5 MG tablet Take 5 mg by mouth 4 (four) times daily. Take one tablet by mouth before each meal and every night at bedtrime      . metoprolol tartrate (LOPRESSOR) 25 MG tablet Take 25 mg by mouth 2 (two) times daily.      . Multiple Vitamin (MULTIVITAMIN) tablet Take 1 tablet by mouth daily.        Marland Kitchen NEXIUM 40 MG capsule  Take 1 capsule by mouth daily.      . ondansetron (ZOFRAN) 4 MG tablet Take 4 mg by mouth every 4 (four) hours as needed. For nausea        . potassium chloride (K-DUR,KLOR-CON) 10 MEQ tablet Take 1 tablet (10 mEq total) by mouth 2 (two) times daily.  180 tablet  3  . rosuvastatin (CRESTOR) 10 MG tablet Take  1 tablet (10 mg total) by mouth daily.  30 tablet  11  . clopidogrel (PLAVIX) 75 MG tablet Take 1 tablet (75 mg total) by mouth daily.  90 tablet  4   No current facility-administered medications for this visit.     ROS:  Pertinent items are noted in HPI.  SOCIAL HISTORY:   Married.  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    PHYSICAL EXAMINATION:    BP 120/72  Pulse 70  Ht 5' 1.5" (1.562 m)  Wt 175 lb (79.379 kg)  BMI 32.53 kg/m2  LMP 02/07/1992   Wt Readings from Last 3 Encounters:  12/25/12 175 lb (79.379 kg)  11/29/12 176 lb (79.833 kg)  11/07/12 175 lb (79.379 kg)     Ht Readings from Last 3 Encounters:  12/25/12 5' 1.5" (1.562 m)  11/07/12 5\' 1"  (1.549 m)  10/16/12 5\' 1"  (1.549 m)    General appearance: alert, cooperative and appears stated age   Abdomen: soft, non-tender; no masses,  no organomegaly Neurologic: Grossly normal  Pelvic: External genitalia:  no lesions.  Pigmentation of the labia minora medially.               Urethra:  normal appearing urethra with no masses, tenderness or lesions              Bartholins and Skenes: normal                 Vagina: normal appearing vagina with normal color and discharge, no lesions              Cervix: normal appearance                  Bimanual Exam:  Uterus:  uterus is normal size, shape, consistency and nontender                                      Adnexa: normal adnexa in size, nontender and no masses                                        Wet prep - ph 5.5.  Negative for clue cells, yeast, and trichomonas.   ASSESSMENT  Abdominal bloating. LLQ pain. Mild microscopic hematuria.  Atrophic  vaginitis. Vulvitis. History of recent TIA.  Osteopenia.   PLAN Return for pelvic ultrasound. I recommend stopping vagina estrogen therapy until neurology has an opportunity to make a recommendation if patient may continue or not. Vaginal vitamin E suppositories for vaginal hydration.  OK to use hydrocortisone cream to vulva to treat irritation symptoms.  Urine culture.  [facility] and [facility].   40 minutes face to face time of which over 50% was spent in counseling.   An After Visit Summary was printed and given to the patient.

## 2012-12-26 ENCOUNTER — Encounter: Payer: Self-pay | Admitting: Obstetrics and Gynecology

## 2012-12-26 ENCOUNTER — Ambulatory Visit (INDEPENDENT_AMBULATORY_CARE_PROVIDER_SITE_OTHER): Payer: Medicare Other | Admitting: Obstetrics and Gynecology

## 2012-12-26 ENCOUNTER — Ambulatory Visit (INDEPENDENT_AMBULATORY_CARE_PROVIDER_SITE_OTHER): Payer: Medicare Other

## 2012-12-26 VITALS — BP 120/70 | HR 70 | Ht 61.5 in | Wt 175.0 lb

## 2012-12-26 DIAGNOSIS — R141 Gas pain: Secondary | ICD-10-CM

## 2012-12-26 DIAGNOSIS — R140 Abdominal distension (gaseous): Secondary | ICD-10-CM

## 2012-12-26 DIAGNOSIS — R9389 Abnormal findings on diagnostic imaging of other specified body structures: Secondary | ICD-10-CM

## 2012-12-26 DIAGNOSIS — R1032 Left lower quadrant pain: Secondary | ICD-10-CM

## 2012-12-26 DIAGNOSIS — N9489 Other specified conditions associated with female genital organs and menstrual cycle: Secondary | ICD-10-CM | POA: Diagnosis not present

## 2012-12-26 LAB — URINE CULTURE
Colony Count: NO GROWTH
Organism ID, Bacteria: NO GROWTH

## 2012-12-26 NOTE — Progress Notes (Signed)
Subjective  Patient is here for pelvic ultrasound for bloating and LLQ pain.  Has multiple GI issues including a diagnosis of gastric carcinoid, gastric polyp, and IBS.   Objective  See ultrasound below.  Thickening of endometrium at fundus 8 x 5 mm. Small intramural fibroid 10 mm.  Normal ovaries.     Assessment  Abdominal bloating.  LLQ apin. I suspect a GI cause. Endometrial thickening.  Possible polyp.  Plan  Return for endometrial biopsy.  I discussed risks and benefits of procedure.  Patient understands that this may result in further procedures such as a hysteroscopy with dilation and curettage for final removal of the specimen.  She understands the need for histologic diagnosis to rule out the possibility of malignancy.  She will follow up with GI regarding her abdominal symptoms.

## 2012-12-26 NOTE — Patient Instructions (Signed)
Please eat a meal and take extra strength Tylenol 30 minutes before your endometrial biopsy.   Endometrial Biopsy Endometrial biopsy is a procedure in which a tissue sample is taken from inside the uterus. The tissue sample is then looked at under a microscope to see if the tissue is normal or abnormal. The endometrium is the lining of the uterus. This procedure helps determine where you are in your menstrual cycle and how hormone levels are affecting the lining of the uterus. This procedure may also be used to evaluate uterine bleeding or to diagnose endometrial cancer, tuberculosis, polyps, or inflammatory conditions.  LET [facility] CARE PROVIDER KNOW ABOUT:  Any allergies you have.  All medicines you are taking, including vitamins, herbs, eye drops, creams, and over-the-counter medicines.  Previous problems you or members of your family have had with the use of anesthetics.  Any blood disorders you have.  Previous surgeries you have had.  Medical conditions you have.  Possibility of pregnancy. RISKS AND COMPLICATIONS Generally, this is a safe procedure. However, as with any procedure, complications can occur. Possible complications include:  Bleeding.  Pelvic infection.  Puncture of the uterine wall with the biopsy device (rare). BEFORE THE PROCEDURE   Keep a record of your menstrual cycles as directed by your health care provider. You may need to schedule your procedure for a specific time in your cycle.  You may want to bring a sanitary pad to wear home after the procedure.  Arrange for someone to drive you home after the procedure if you will be given a medicine to help you relax (sedative). PROCEDURE   You may be given a sedative to relax you.  You will lie on an exam table with your feet and legs supported as in a pelvic exam.  Your health care provider will insert an instrument (speculum) into your vagina to see your cervix.  Your cervix will be cleansed with an  antiseptic solution. A medicine (local anesthetic) will be used to numb the cervix.  A forceps instrument (tenaculum) will be used to hold your cervix steady for the biopsy.  A thin, rodlike instrument (uterine sound) will be inserted through your cervix to determine the length of your uterus and the location where the biopsy sample will be removed.  A thin, flexible tube (catheter) will be inserted through your cervix and into the uterus. The catheter is used to collect the biopsy sample from your endometrial tissue.  The catheter and speculum will then be removed, and the tissue sample will be sent to a lab for examination. AFTER THE PROCEDURE  You will rest in a recovery area until you are ready to go home.  You may have mild cramping and a small amount of vaginal bleeding for a few days after the procedure. This is normal.  Make sure you find out how to get your test results. Document Released: 05/26/2004 Document Revised: 09/25/2012 Document Reviewed: 07/10/2012 [facility] Patient Information 2014 [place], [place].

## 2013-01-06 ENCOUNTER — Telehealth: Payer: Self-pay | Admitting: Obstetrics and Gynecology

## 2013-01-06 NOTE — Telephone Encounter (Signed)
Spoke with pt about scheduling endometrial biopsy 01-08-13 at 11:30 with BS. Pt does not have her calendar with her, but she will call back if it does not work.

## 2013-01-06 NOTE — Telephone Encounter (Signed)
Patient calling to check on status of scheduling a biopsy with Dr. Edward Jolly.

## 2013-01-07 NOTE — Telephone Encounter (Signed)
LMTCB to discuss insurance benefits for Endo bx.

## 2013-01-08 ENCOUNTER — Ambulatory Visit: Payer: Medicare Other | Admitting: Obstetrics and Gynecology

## 2013-01-08 ENCOUNTER — Ambulatory Visit (INDEPENDENT_AMBULATORY_CARE_PROVIDER_SITE_OTHER): Payer: Medicare Other | Admitting: Obstetrics and Gynecology

## 2013-01-08 ENCOUNTER — Encounter: Payer: Self-pay | Admitting: Obstetrics and Gynecology

## 2013-01-08 VITALS — BP 122/70 | HR 72 | Resp 16 | Wt 177.0 lb

## 2013-01-08 DIAGNOSIS — R9389 Abnormal findings on diagnostic imaging of other specified body structures: Secondary | ICD-10-CM

## 2013-01-08 DIAGNOSIS — N9489 Other specified conditions associated with female genital organs and menstrual cycle: Secondary | ICD-10-CM

## 2013-01-08 NOTE — Patient Instructions (Addendum)
We will call you to schedule the hysteroscopy with dilation and curettage as we discussed.   Please review your pamphlets to review these concepts.

## 2013-01-08 NOTE — Progress Notes (Signed)
[name], female   DOB: Jun 29, 1940, 72 y.o.   MRN: [id number]  Subjective  G0P0 Caucasian female Patient presents for an endometrial biopsy for thickened endometrium 8 x 5 mm, noted at uterine fundus with pelvic ultrasound ordered for abdominal bloating and LLQ pain.   Patient feeling anxious waiting in exam room for visit today.  Took Tylenol in preparation for visit.   Objective  Procedure - endometrial biopsy attempt Consent performed. Pederson speculum placed in vagina.  Cervix small and high in vaginal vault.  Patient stated she did not feel well, so speculum removed and procedure abandoned.  BP repeat 152/78   Pulse 80 Cor S1S2 RRR.  No murmur. Patient given cold Diet Coke to drink. Patient states she then felt fine.   Assessment  Endometrial thickening.  I suspect a polyp. Nulliparous cervix. Intolerant of office examination. History of recent TIA.  Stopped Plavix.   Plan  Proceed with hysteroscopic polypectomy, dilation and curettage. I discussed benefits and risks which include but are not limited to bleeding, infection, damage to surrounding organs, uterine perforation, need for further treatment - medical or surgical following surgery, fluid overload causing hyponatremia or pulmonary edema.   She wishes to proceed.  Will proceed with precert and scheduling.  I recommend medical clearance by patient's internist.

## 2013-01-09 ENCOUNTER — Other Ambulatory Visit (INDEPENDENT_AMBULATORY_CARE_PROVIDER_SITE_OTHER): Payer: Medicare Other

## 2013-01-09 ENCOUNTER — Ambulatory Visit (INDEPENDENT_AMBULATORY_CARE_PROVIDER_SITE_OTHER): Payer: Medicare Other | Admitting: Internal Medicine

## 2013-01-09 ENCOUNTER — Encounter: Payer: Self-pay | Admitting: Internal Medicine

## 2013-01-09 VITALS — BP 120/68 | HR 68 | Ht 61.0 in | Wt 176.6 lb

## 2013-01-09 DIAGNOSIS — Z23 Encounter for immunization: Secondary | ICD-10-CM

## 2013-01-09 DIAGNOSIS — K3184 Gastroparesis: Secondary | ICD-10-CM | POA: Diagnosis not present

## 2013-01-09 DIAGNOSIS — K589 Irritable bowel syndrome without diarrhea: Secondary | ICD-10-CM | POA: Diagnosis not present

## 2013-01-09 DIAGNOSIS — E871 Hypo-osmolality and hyponatremia: Secondary | ICD-10-CM | POA: Diagnosis not present

## 2013-01-09 DIAGNOSIS — K219 Gastro-esophageal reflux disease without esophagitis: Secondary | ICD-10-CM | POA: Diagnosis not present

## 2013-01-09 LAB — COMPREHENSIVE METABOLIC PANEL
ALT: 33 U/L (ref 0–35)
AST: 29 U/L (ref 0–37)
Albumin: 4 g/dL (ref 3.5–5.2)
Alkaline Phosphatase: 67 U/L (ref 39–117)
BUN: 16 mg/dL (ref 6–23)
Creatinine, Ser: 0.7 mg/dL (ref 0.4–1.2)
GFR: 93.63 mL/min (ref >60.00)
Potassium: 4.2 mEq/L (ref 3.5–5.1)
Sodium: 137 mEq/L (ref 135–145)
Total Protein: 7.6 g/dL (ref 6.0–8.3)

## 2013-01-09 MED ORDER — DICYCLOMINE HCL 10 MG PO CAPS: 10.0000 mg | ORAL_CAPSULE | Freq: Four times a day (QID) | ORAL | Status: DC | PRN

## 2013-01-09 MED ORDER — ESOMEPRAZOLE MAGNESIUM 40 MG PO CPDR: 40.0000 mg | DELAYED_RELEASE_CAPSULE | Freq: Every day | ORAL | Status: DC

## 2013-01-09 NOTE — Assessment & Plan Note (Addendum)
Continue PPI. The possibility of interaction between clopidogrel and esomeprazole is noted but it's not conclusive and she does better on esomeprazole so we will continue that. She is not currently taking her clopidogrel anyway and is changed back to the aspirin a day.

## 2013-01-09 NOTE — Patient Instructions (Addendum)
Today you have been given written rx for Nexium to use when needed.  We will meet again in spring 2015 when your due for your recall.  Today you have been given a flu vaccine.  Your physician has requested that you go to the basement for the following lab work before leaving today: CMET   I appreciate the opportunity to care for you.

## 2013-01-09 NOTE — Assessment & Plan Note (Signed)
Sodium was 132 in August. We'll recheck that as part of a metabolic panel today.

## 2013-01-09 NOTE — Assessment & Plan Note (Signed)
I think she's doing fairly well. She uses metoclopramide 5 mg twice a day she generally okay. Though there are still risks of side effects I reassured her in that I thought a low dose side and she is doing well without any signs or tardive dyskinesia or neuropathy. She will continue that. I had previously recommended a trial of domperidone but she does not want to do that.

## 2013-01-09 NOTE — Assessment & Plan Note (Signed)
This seems stable overall. Dicyclomine does provide relief when she gets crampy abdominal pain. She uses that rarely intermittently and will continue.

## 2013-01-09 NOTE — Progress Notes (Signed)
         Subjective:    [name], female    DOB: [birth year], 72 y.o.   MRN: [id number]  HPI The patient is here for followup of gastroparesis, GERD and IBS. 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 She is complaining of intermittent abdominal bloating and pain. She also has loose stools. Since I last saw her she was told to stop taking Nexium because of potential interaction with clopidogrel. She started pantoprazole but that really did not relieve her heartburn symptoms. She went back on Nexium, and is taking to over-the-counter Nexium daily with generally good relief. She notices that metoclopramide does help her with her bloating and gastroparesis symptoms but she remains concerned about the possibility of side effects including tardive dyskinesia, though she has not had any of those. He continues to worry about possible side effects of medications. She is concerned about the development of cancer still. Wt Readings from Last 3 Encounters:  01/09/13 176 lb 9.6 oz (80.105 kg)  01/08/13 177 lb (80.287 kg)  12/26/12 175 lb (79.379 kg)  Medications, allergies, past medical history, past surgical history, family history and social history are reviewed and updated in the EMR. Review of Systems She had a possible TIA in the late summer as well and was started on clopidogrel. She said it made her mouth feel funny etc. so she stopped taking the culprit ago she has neurology followup soon. Her husband has been getting daily antibiotic injections. He remains chronically ill.    Objective:   Physical Exam Well-developed elderly woman in no acute distress Abdomen is soft, bowel sounds are present she is mildly tender in the epigastrium and she tends to be, there is no succussion splash.    Assessment & Plan:   1. IBS (irritable bowel syndrome)   2. Gastroparesis   3. GERD (gastroesophageal reflux disease)   4. Hyponatremia   5. Need for  prophylactic vaccination and inoculation against influenza

## 2013-01-10 NOTE — Progress Notes (Signed)
Quick Note:  Let her know labs are fine - no liver problems and sodium back to normal ______

## 2013-01-13 ENCOUNTER — Other Ambulatory Visit: Payer: Self-pay

## 2013-01-13 MED ORDER — AMLODIPINE BESYLATE 5 MG PO TABS: ORAL_TABLET | ORAL | Status: DC

## 2013-01-15 ENCOUNTER — Encounter: Payer: Self-pay | Admitting: Nurse Practitioner

## 2013-01-15 ENCOUNTER — Ambulatory Visit (INDEPENDENT_AMBULATORY_CARE_PROVIDER_SITE_OTHER): Payer: Medicare Other | Admitting: Nurse Practitioner

## 2013-01-15 VITALS — BP 118/68 | HR 63 | Ht 60.0 in | Wt 176.0 lb

## 2013-01-15 DIAGNOSIS — H3400 Transient retinal artery occlusion, unspecified eye: Secondary | ICD-10-CM

## 2013-01-15 DIAGNOSIS — G459 Transient cerebral ischemic attack, unspecified: Secondary | ICD-10-CM

## 2013-01-15 DIAGNOSIS — G453 Amaurosis fugax: Secondary | ICD-10-CM

## 2013-01-15 NOTE — Progress Notes (Signed)
PATIENT: [name] DOB: 01-20-1941   REASON FOR VISIT: follow up for Amaurosis fugax HISTORY FROM: patient  HISTORY OF PRESENT ILLNESS: 72 year old right-handed female with hypertension, diabetes, hypercholesterolemia, here for valuation of transient visual loss in left eye.  10/16/12 (VP):  Approximately 2 weeks ago, patient had been watching television when all of a sudden everything went out of focus. She then noticed alternating white and black sensation from her left eye. This lasted for approximately 15 minutes. She says she cannot see any objects out of her left eye when she covered her right eye with her hand. She says that she remembers a "white" vision rather than fully black. She had some dizziness at that time. She checked her blood pressure which was elevated systolic blood pressure greater than 170. She also noted some left eye pain and left arm weakness during this event. Within 15 minutes symptoms had resolved. No headache during this event. Patient has had prior episodes suspicious for TIA in the past in 2012 and 2013. Patient sees cardiology for evaluation and management of supraventricular tachycardia episode.   UPDATE 01/15/13 (LL):  Since last visit, patient has seen cardiology; cardiac event monitor did not show any arrhythmias.  Her carotid doppler was negative for significant stenosis. Her CRP and ESR were normal.  MRI brain shows no acute intracranial abnormality or significant interval change.  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 This is likely related to chronic atrophy or congenital anomaly. She has had no further vision problems or stroke/TIA symptoms.  She was switched to Plavix at her last visit here but has since switched back to aspirin on her own due to a sore feeling in her throat while taking plavix.  She states her tongue felt thick and the neck (points to cervical lymph nodes) hurt.  She has history of gerd  and carcinoid tumor of the stomach.   REVIEW OF SYSTEMS: Full 14 system review of systems performed and notable only for murmur swelling of legs eye pain, light sensitivity, eye discharge, flushing, ringing in ears, swollen abdomen, joint pain, numbness, headache, dizziness.  ALLERGIES: Allergies  Allergen Reactions  . Tramadol     Dizziness     HOME MEDICATIONS: Outpatient Prescriptions Prior to Visit  Medication Sig Dispense Refill  . amLODipine (NORVASC) 5 MG tablet Take one tablet by mouth daily.  90 tablet  2  . AZASITE 1 % ophthalmic solution Place 1 drop into the right eye daily.      . cholecalciferol (VITAMIN D) 1000 UNITS tablet Take 1,000 Units by mouth daily.        . clopidogrel (PLAVIX) 75 MG tablet Take 1 tablet (75 mg total) by mouth daily.  90 tablet  4  . dicyclomine (BENTYL) 10 MG capsule Take 1 capsule (10 mg total) by mouth every 6 (six) hours as needed (abdominal pain- may take 2 capsules if 1 ineffective).  60 capsule  0  . DIOVAN 320 MG tablet Take 1 tablet by mouth  daily  90 tablet  0  . esomeprazole (NEXIUM) 40 MG capsule Take 1 capsule (40 mg total) by mouth daily before breakfast.  90 capsule  3  . ESTRACE VAGINAL 0.1 MG/GM vaginal cream Place vaginally as needed.       . Ferrous Sulfate (IRON) 28 MG TABS Take by mouth daily.        . folic acid-pyridoxine-cyancobalamin (FOLBIC) 2.5-25-2 MG TABS Take 1 tablet by mouth daily.  90 tablet  3  . metFORMIN (GLUCOPHAGE-XR) 500 MG 24 hr tablet Take 2 tablets (1,000 mg total) by mouth 2 (two) times daily.  180 tablet  1  . metoCLOPramide (REGLAN) 5 MG tablet Take 5 mg by mouth 4 (four) times daily. Take one tablet by mouth before each meal and every night at bedtrime      . metoprolol tartrate (LOPRESSOR) 25 MG tablet Take 25 mg by mouth 2 (two) times daily.      . Multiple Vitamin (MULTIVITAMIN) tablet Take 1 tablet by mouth daily.        . ondansetron (ZOFRAN) 4 MG tablet Take 4 mg by mouth every 4 (four) hours as  needed. For nausea        . potassium chloride (K-DUR,KLOR-CON) 10 MEQ tablet Take 1 tablet (10 mEq total) by mouth 2 (two) times daily.  180 tablet  3  . rosuvastatin (CRESTOR) 10 MG tablet Take 1 tablet (10 mg total) by mouth daily.  30 tablet  11   No facility-administered medications prior to visit.    PAST MEDICAL HISTORY: Past Medical History  Diagnosis Date  . Gastroparesis   . Iron deficiency anemia, unspecified   . Pure hypercholesterolemia   . Headache(784.0)   . Unspecified essential hypertension   . Esophageal reflux   . Type II or unspecified type diabetes mellitus without mention of complication, not stated as uncontrolled   . Arthritis     Spinal Osteoarthritis  . Diverticulosis 08/30/2000    Colonoscopy   . Gastric polyp     Fundic Gland  . Anxiety   . Cataract   . Heart murmur     Echocardiogram 2/11: EF 60-65%, mild LAE, grade 1 diastolic dysfunction, aortic valve sclerosis, mean gradient 9 mm of mercury, PASP 34  . Stroke     tia  . Chest pain     Myoview 1/11: EF 82%, no ischemia.  Marland Kitchen PSVT (paroxysmal supraventricular tachycardia)   . Carcinoid tumor of stomach     PAST SURGICAL HISTORY: Past Surgical History  Procedure Laterality Date  . Cholecystectomy  1993  . Tonsillectomy    . Colonoscopy  11/11/2010    diverticulosis  . Esophagogastroduodenoscopy  08/29/2010; 09/15/2010    Carcinoid tumor less than 1 cm in July 2012 not seen in August 2012 , gastritis, fundic gland polyps  . Eus  12/15/2010    Procedure: UPPER ENDOSCOPIC ULTRASOUND (EUS) LINEAR;  Surgeon: Rob Bunting, MD;  Location: [facility];  Service: Endoscopy;  Laterality: N/A;  . Esophagogastroduodenoscopy  05/16/2011  . Esophagogastroduodenoscopy  06/14/2012    FAMILY HISTORY: Family History  Problem Relation Age of Onset  . Diabetes Mother   . Stroke Father     deceased age 72  . Heart disease Sister     deceased MI age 16  . Heart disease Brother     deceased MI age 43  . Colon  cancer Neg Hx   . Esophageal cancer Neg Hx   . Stomach cancer Neg Hx   . Rectal cancer Neg Hx     SOCIAL HISTORY: History   Social History  . Marital Status: Married    Spouse Name: N/A    Number of Children: 0  . Years of Education: N/A   Occupational History  . Retail    Social History Main Topics  . Smoking status: Never Smoker   . Smokeless tobacco: Never Used     Comment: Daily Caffeine Use -1  .  Alcohol Use: No  . Drug Use: No  . Sexual Activity: No   Other Topics Concern  . Not on file   Social History Narrative   One caffeine drink daily      PHYSICAL EXAM  There were no vitals filed for this visit. There is no weight on file to calculate BMI.  Generalized: Well developed, in no acute distress  Head: normocephalic and atraumatic. Oropharynx benign  Neck: Supple, no carotid bruits  Cardiac: Regular rate rhythm, grade 2/6 systolic murmur  Musculoskeletal: No deformity   Neurological examination  Mentation: Alert oriented to time, place, history taking. Follows all commands speech and language fluent Cranial nerve II-XII: .Pupils were equal round reactive to light extraocular movements were full, visual field were full on confrontational test. Facial sensation and strength were normal. hearing was intact to finger rubbing bilaterally. Uvula tongue midline. head turning and shoulder shrug and were normal and symmetric.Tongue protrusion into cheek strength was normal. Motor: normal bulk and tone, full strength in the BUE, BLE, fine finger movements normal, no pronator drift. No focal weakness Sensory: normal and symmetric to light touch Coordination: finger-nose-finger, heel-to-shin bilaterally, no dysmetria Reflexes:  Deep tendon reflexes in the upper and lower extremities are present and symmetric.  Gait and Station: Rising up from seated position without assistance, normal stance, without trunk ataxia, moderate stride, good arm swing, smooth turning, able to  perform tiptoe, and heel walking without difficulty.   DIAGNOSTIC DATA (LABS, IMAGING, TESTING) - I reviewed patient records, labs, notes, testing and imaging myself where available.  Lab Results  Component Value Date   WBC 8.9 10/20/2011   HGB 11.9* 07/13/2012   HCT 35.0* 07/13/2012   MCV 84.3 10/20/2011   PLT 224.0 10/20/2011      Component Value Date/Time   NA 137 01/09/2013 1249   K 4.2 01/09/2013 1249   CL 102 01/09/2013 1249   CO2 26 01/09/2013 1249   GLUCOSE 88 01/09/2013 1249   BUN 16 01/09/2013 1249   CREATININE 0.7 01/09/2013 1249   CREATININE 0.83 08/04/2011 1545   CALCIUM 9.6 01/09/2013 1249   PROT 7.6 01/09/2013 1249   ALBUMIN 4.0 01/09/2013 1249   AST 29 01/09/2013 1249   ALT 33 01/09/2013 1249   ALKPHOS 67 01/09/2013 1249   BILITOT 0.7 01/09/2013 1249   GFRNONAA >90 01/16/2011 2104   GFRAA >90 01/16/2011 2104   Lab Results  Component Value Date   CHOL 194 06/07/2012   HDL 67.00 06/07/2012   LDLCALC 98 06/07/2012   LDLDIRECT 123.7 04/13/2011   TRIG 147.0 06/07/2012   CHOLHDL 3 06/07/2012   Lab Results  Component Value Date   HGBA1C 6.8* 11/29/2012   Lab Results  Component Value Date   VITAMINB12 >1500 pg/mL* 12/14/2008   Lab Results  Component Value Date   TSH 0.90 10/20/2011   Lab Results  Component Value Date   ESRSEDRATE 31 10/28/2012   ASSESSMENT AND PLAN 73 y.o. year old female here with transient left eye visual abnormality lasting for 15 minutes. May represent TIA. She has vascular risk factors of family history of heart disease and stroke; hypertension; hyperlipidemia; and diabetes.  She had questionable side effects to Plavix, recommended re-trial.    PLAN: Re-try Plavix 75 mg orally every day  for secondary stroke prevention.  Instructed patient as with any medication, if you have severe allergic reaction and have trouble breathing, call 911. If you cannot tolerate the Plavix, take aspirin 325 mg daily (coated) instead (  if ok with Dr. Leone Payor); otherwise baby aspirin  is next best option. Maintain strict control of hypertension with blood pressure goal below 130/90, diabetes with hemoglobin A1c goal below 6.5% and lipids with LDL cholesterol goal below 70 mg/dL.  Followup in the future in 6 month. Visit time 35 min. With over 50% time spent educating patient on TIA, stroke, risk factors, and prevention.  Ronal Fear, MSN, NP-C 01/15/2013, 11:11 AM [facility] [address] [place], [place] [address] [phone]  Note: This document was prepared with digital dictation and possible smart phrase technology. Any transcriptional errors that result from this process are unintentional.

## 2013-01-20 NOTE — Telephone Encounter (Signed)
Patient was wondering what they were doing to do about procedure in hospital said she hasnt heard anything from anything about it.

## 2013-01-21 DIAGNOSIS — H01029 Squamous blepharitis unspecified eye, unspecified eyelid: Secondary | ICD-10-CM | POA: Diagnosis not present

## 2013-01-21 DIAGNOSIS — H2510 Age-related nuclear cataract, unspecified eye: Secondary | ICD-10-CM | POA: Diagnosis not present

## 2013-01-21 NOTE — Telephone Encounter (Signed)
Call to patient. Advised of surgery date 02-25-13 at 1030 at [facility]. Surgery instructions reviewed and mailed. Pre-op appointment scheduled for 02-13-13. Despite that she states she has several questions, she declined earlier appt for preop.  She asked if this procedure was "necessary" and I advised that with mass in the uterus and lining being thick, we need to remove this tissue to make sure there is no abnormal cells, including cancer.    Patient is diabetic on oral meds. Reports a history of hypoglycemia when NPO for endoscopy. Advised will check with anesthesia about this.  She reports she is has been given  Plavix but is not taking it.  Advised not to start it now since would have to discontinue it prior to surgery and usually preferred not to start and stop this medication without direct mgmt from PCP.  Any additional instruction?

## 2013-01-28 NOTE — Telephone Encounter (Signed)
Call to patient, female states she is not available. LM to call at her convenience.

## 2013-01-28 NOTE — Telephone Encounter (Signed)
Ms. [name] needs medical clearance for her surgery due to her multiple medical issues.  I do not have a report to date that this was done yet.  She is the patient I wanted to have stop the Plavix prior to her procedure.  (Her neurologist prescribed this to her.)

## 2013-02-03 ENCOUNTER — Telehealth: Payer: Self-pay | Admitting: *Deleted

## 2013-02-03 NOTE — Telephone Encounter (Signed)
Patient returned my call.  Advised that Dr Edward Jolly will need letter for PCP clearing her for surgery. Patient states PCP does not know "all about her" states another Dr takes care of her heart and blood pressure. Advised this is fine to have ?cardiology give clearance but we will need medical clearance prior to surgery on 02-25-13.  I confirmed with patient she is still NOT taking Plavix. Instructed her to call her preferred physician and see if they can provide letter and she may need to be seen by them before they will give this to her.  Routing to provider for final review. Patient agreeable to disposition. Will close encounter

## 2013-02-04 ENCOUNTER — Telehealth: Payer: Self-pay | Admitting: Cardiology

## 2013-02-04 NOTE — Telephone Encounter (Signed)
Left message for pt to return the call to discuss her needs

## 2013-02-04 NOTE — Telephone Encounter (Signed)
Pt has question about meds,and also pt is getting procedure done and was told to stop some medicines for two weeks please advise.

## 2013-02-04 NOTE — Telephone Encounter (Signed)
New message     Returned Pam's call

## 2013-02-04 NOTE — Telephone Encounter (Signed)
Per pt - she was asked not to take ASA 2 weeks prior to her procedure to remove polyps from her uterus.  This is scheduled for 1/20 and she is to stop taking ASA 02/11/2013.  Needs to know if this is OK.  Of note - pt states she has not taken Plavix for "some time" because it was making her sick to her stomach.    Also the insurance company will no longer cover Diovan and she needs to know what medication to take in place of it.  She can not afford to pay for Diovan as it is over $300/month.  She is aware I will forward this information to Dr Antoine Poche and call her back with recommendations.

## 2013-02-04 NOTE — Telephone Encounter (Signed)
See next phone note.

## 2013-02-06 ENCOUNTER — Other Ambulatory Visit: Payer: Self-pay | Admitting: Endocrinology

## 2013-02-06 ENCOUNTER — Other Ambulatory Visit: Payer: Self-pay | Admitting: Cardiology

## 2013-02-07 ENCOUNTER — Other Ambulatory Visit: Payer: Self-pay | Admitting: *Deleted

## 2013-02-07 MED ORDER — METFORMIN HCL ER 500 MG PO TB24: 1000.0000 mg | ORAL_TABLET | Freq: Two times a day (BID) | ORAL | Status: DC

## 2013-02-10 NOTE — Telephone Encounter (Signed)
She can hold the ASA and we can stop the diovan and start Losartan 50 mg bid po.  Disp number 60 with 11 refills.

## 2013-02-11 ENCOUNTER — Telehealth: Payer: Self-pay | Admitting: Cardiology

## 2013-02-11 NOTE — Telephone Encounter (Signed)
New message      Pt need to stop diovan 320 / aspirin for 2 weeks for a procedure. Will this be ok

## 2013-02-11 NOTE — Telephone Encounter (Signed)
Pt needs 2 things: 1) Pt needs PA for Diovan, insurance does not want to pay for Diovan but per pt this was the only med found to date to decrease her bp. Please forward to refills if you would like the PA process started. 2) Pt also needs the ok to hold asa for 2 weeks for removal of uterine polyps.  Please advise. Pt aware dr/nurse back tomorrow.

## 2013-02-12 NOTE — Telephone Encounter (Signed)
Pt needs PA for Diovan.   She has tried many other medications in the past to control her HTN but had a lot of trouble with them all.  She is aware we will try to get it approved.  She is also aware OK to hold ASA as needed for surgery.

## 2013-02-12 NOTE — Telephone Encounter (Signed)
OK to try to get her Diovan since she does not want to try losartan.

## 2013-02-13 ENCOUNTER — Other Ambulatory Visit (HOSPITAL_COMMUNITY): Payer: Medicare Other

## 2013-02-13 ENCOUNTER — Encounter: Payer: Self-pay | Admitting: Obstetrics and Gynecology

## 2013-02-13 ENCOUNTER — Ambulatory Visit (INDEPENDENT_AMBULATORY_CARE_PROVIDER_SITE_OTHER): Payer: Medicare Other | Admitting: Obstetrics and Gynecology

## 2013-02-13 VITALS — BP 122/60 | HR 64 | Ht 60.0 in | Wt 178.0 lb

## 2013-02-13 DIAGNOSIS — R9389 Abnormal findings on diagnostic imaging of other specified body structures: Secondary | ICD-10-CM

## 2013-02-13 NOTE — Telephone Encounter (Signed)
PA sent to [employer] for diovan

## 2013-02-13 NOTE — Progress Notes (Signed)
[name], female   DOB: 1940/04/03, 73 y.o.   MRN: [id number] GYNECOLOGY PROBLEM VISIT  PCP:    Renato Shin, MD  Referring provider:   HPI: 73 y.o.   Married  Caucasian  female   G0P0 with Patient's last menstrual period was 02/07/1992.   here for  Discuss surgery.  Thickened endometrium 5 x 8 mm on pelvic ultrasound ordered for abdominal bloating and left lower quadrant pain.  1 cm intramural fibroid noted and ovaries normal. Unable to do office endometrial biopsy due to patient intolerance of procedure and nulliparous cervix.  Patient has a history of an endometrial biopsy in [place] over 10 years ago prior to starting on HRT.  Not currently on HRT.   Not taking Plavix.   GYNECOLOGIC HISTORY: Patient's last menstrual period was 02/07/1992. Sexually active:  no Partner preference: female Contraception:   postmenopausal Menopausal hormone therapy: no DES exposure:   no Blood transfusions: no   Sexually transmitted diseases:   no GYN Procedures:  Laparoscopy 1993--(incidentally had gallbladder removed) Mammogram:   04-10-12 wnl:[facility]            Pap:   07-13-10 wnl History of abnormal pap smear:  15-20 years ago had 1 abnormal pap.  No colposcopy and no treatment.  Repeat pap normal.   OB History   Grav Para Term Preterm Abortions TAB SAB Ect Mult Living   0                  Family History  Problem Relation Age of Onset  . Diabetes Mother   . Stroke Father     deceased age 60  . Heart disease Sister     deceased MI age 6  . Heart disease Brother     deceased MI age 45  . Colon cancer Neg Hx   . Esophageal cancer Neg Hx   . Stomach cancer Neg Hx   . Rectal cancer Neg Hx     Patient Active Problem List   Diagnosis Date Noted  . Hyponatremia 01/09/2013  . GERD (gastroesophageal reflux disease) 01/09/2013  . Amaurosis fugax of left eye 10/16/2012  . Amaurosis fugax 09/23/2012  . Low back pain 06/03/2012  . Vitamin D deficiency  03/11/2012  . Bilateral hand pain 10/20/2011  . Encounter for long-term (current) use of other medications 10/20/2011  . IBS (irritable bowel syndrome) 08/14/2011  . TIA (transient ischemic attack) 02/10/2011  . Abnormal brain MRI 01/19/2011  . Allergic rhinitis 10/01/2010  . Myalgia 09/30/2010  . 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 FUNDIC GLAND POLYPS OF STOMACH 03/18/2010  . EPIGASTRIC PAIN chronic 03/18/2010  . ARTHRALGIA 03/17/2009  . SYSTOLIC MURMUR 50/56/9794  . SUPRAVENTRICULAR TACHYCARDIA 01/12/2009  . PLANTAR FASCIITIS 06/08/2008  . CHEST PAIN 05/18/2008  . Gastroparesis 12/18/2007  . HYPERCHOLESTEROLEMIA 06/11/2007  . DIABETES MELLITUS, TYPE II 09/05/2006  . HYPERTENSION 09/05/2006    Past Medical History  Diagnosis Date  . Gastroparesis   . Iron deficiency anemia, unspecified   . Pure hypercholesterolemia   . Headache(784.0)   . Unspecified essential hypertension   . Esophageal reflux   . Type II or unspecified type diabetes mellitus without mention of complication, not stated as uncontrolled   . Arthritis     Spinal Osteoarthritis  . Diverticulosis 08/30/2000    Colonoscopy   . Gastric polyp     Fundic Gland  . Anxiety   . Cataract   . Heart  murmur     Echocardiogram 2/11: EF 60-65%, mild LAE, grade 1 diastolic dysfunction, aortic valve sclerosis, mean gradient 9 mm of mercury, PASP 34  . Stroke     tia  . Chest pain     Myoview 1/11: EF 82%, no ischemia.  Marland Kitchen PSVT (paroxysmal supraventricular tachycardia)   . Carcinoid tumor of stomach     Past Surgical History  Procedure Laterality Date  . Cholecystectomy  1993  . Tonsillectomy    . Colonoscopy  11/11/2010    diverticulosis  . Esophagogastroduodenoscopy  08/29/2010; 09/15/2010    Carcinoid tumor less than 1 cm in July 2012 not seen in August 2012 , gastritis, fundic gland polyps  . Eus  12/15/2010    Procedure: UPPER ENDOSCOPIC ULTRASOUND (EUS) LINEAR;  Surgeon: Owens Loffler, MD;   Location: [facility];  Service: Endoscopy;  Laterality: N/A;  . Esophagogastroduodenoscopy  05/16/2011  . Esophagogastroduodenoscopy  06/14/2012    ALLERGIES: Tramadol  Current Outpatient Prescriptions  Medication Sig Dispense Refill  . amLODipine (NORVASC) 5 MG tablet Take one tablet by mouth daily.  90 tablet  2  . AZASITE 1 % ophthalmic solution Place 1 drop into the right eye daily.      . cholecalciferol (VITAMIN D) 1000 UNITS tablet Take 1,000 Units by mouth daily.        Marland Kitchen dicyclomine (BENTYL) 10 MG capsule Take 1 capsule (10 mg total) by mouth every 6 (six) hours as needed (abdominal pain- may take 2 capsules if 1 ineffective).  60 capsule  0  . DIOVAN 320 MG tablet Take 1 tablet by mouth  daily  90 tablet  0  . esomeprazole (NEXIUM) 40 MG capsule Take 1 capsule (40 mg total) by mouth daily before breakfast.  90 capsule  3  . folic acid-pyridoxine-cyancobalamin (FOLBIC) 2.5-25-2 MG TABS Take 1 tablet by mouth daily.  90 tablet  3  . metFORMIN (GLUCOPHAGE-XR) 500 MG 24 hr tablet Take 2 tablets (1,000 mg total) by mouth 2 (two) times daily.  180 tablet  1  . metoCLOPramide (REGLAN) 5 MG tablet Take 5 mg by mouth 4 (four) times daily. Take one tablet by mouth before each meal and every night at bedtrime      . metoprolol tartrate (LOPRESSOR) 25 MG tablet Take 25 mg by mouth 2 (two) times daily.      . Multiple Vitamin (MULTIVITAMIN) tablet Take 1 tablet by mouth daily.        . ondansetron (ZOFRAN) 4 MG tablet Take 4 mg by mouth every 4 (four) hours as needed. For nausea        . potassium chloride (K-DUR,KLOR-CON) 10 MEQ tablet Take 1 tablet (10 mEq total) by mouth 2 (two) times daily.  180 tablet  3  . rosuvastatin (CRESTOR) 10 MG tablet Take 1 tablet (10 mg total) by mouth daily.  30 tablet  11  . clopidogrel (PLAVIX) 75 MG tablet Take 1 tablet (75 mg total) by mouth daily.  90 tablet  4  . Ferrous Sulfate (IRON) 28 MG TABS Take by mouth daily.        . metoprolol tartrate (LOPRESSOR)  25 MG tablet Take 1 tablet (25 mg total) by mouth 2 (two) times  daily.  180 tablet  0   No current facility-administered medications for this visit.     ROS:  Pertinent items are noted in HPI.  SOCIAL HISTORY:  Married.  Cares for her husband.   PHYSICAL EXAMINATION:    BP  122/60  Pulse 64  Ht 5' (1.524 m)  Wt 178 lb (80.74 kg)  BMI 34.76 kg/m2  LMP 02/07/1992   Wt Readings from Last 3 Encounters:  02/13/13 178 lb (80.74 kg)  01/15/13 176 lb (79.833 kg)  01/09/13 176 lb 9.6 oz (80.105 kg)     Ht Readings from Last 3 Encounters:  02/13/13 5' (1.524 m)  01/15/13 5' (1.524 m)  01/09/13 5\' 1"  (1.549 m)    General appearance: alert, cooperative and appears stated age Head: Normocephalic, without obvious abnormality, atraumatic Neck: no adenopathy, supple, symmetrical, trachea midline and thyroid not enlarged, symmetric, no tenderness/mass/nodules Lungs: clear to auscultation bilaterally Breasts: Inspection negative, No nipple retraction or dimpling, No nipple discharge or bleeding, No axillary or supraclavicular adenopathy, Normal to palpation without dominant masses Heart: regular rate and rhythm Abdomen: soft, non-tender; no masses,  no organomegaly Extremities: extremities normal, atraumatic, no cyanosis or edema Skin: Skin color, texture, turgor normal. No rashes or lesions Lymph nodes: Cervical, supraclavicular, and axillary nodes normal. No abnormal inguinal nodes palpated Neurologic: Grossly normal  Pelvic: External genitalia:  no lesions              Urethra:  normal appearing urethra with no masses, tenderness or lesions              Bartholins and Skenes: normal                 Vagina: normal appearing vagina with normal color and discharge, no lesions              Cervix: normal appearance                 Bimanual Exam:  Uterus:  uterus is normal size, shape, consistency and nontender                                      Adnexa: normal adnexa in size, nontender  and no masses                                      Rectovaginal: Confirms                                      Anus:  normal sphincter tone, no lesions  ASSESSMENT  Thickened endometrium. Unable to do office sampling.  PLAN  Proceed with hysteroscopy with anticipated polypectomy and dilation and curettage.  Risks, benefits, and alternatives discussed with the patient who wishes to proceed. No NSAIDs, or ASA for 10 days prior to procedure.  Our office will contact Dr. Percival Spanish to see if patient needs cardiac clearance appointment prior to her surgery. Patient will contact the hospital to schedule her preop visit there with anesthesia and nurse.  50 minutes face to face time of which over 50% was spent in counseling.    An After Visit Summary was printed and given to the patient.

## 2013-02-13 NOTE — Patient Instructions (Signed)
I will ask the office staff to contact you about billing issues related to the surgery. I will have our clinical nurse supervisor contact Dr. Rosezella Florida office to understand if he needs to do a cardiac clearance appointment prior to your surgery.

## 2013-02-13 NOTE — Telephone Encounter (Signed)
Pt aware OK to hold ASA Sent phone note to [place] Sox for PA on Diovan.

## 2013-02-14 ENCOUNTER — Telehealth: Payer: Self-pay | Admitting: Cardiology

## 2013-02-14 ENCOUNTER — Encounter (HOSPITAL_COMMUNITY): Payer: Self-pay

## 2013-02-14 ENCOUNTER — Telehealth: Payer: Self-pay | Admitting: *Deleted

## 2013-02-14 NOTE — Telephone Encounter (Signed)
Thank you for contacting Dr. Rosezella Florida office.

## 2013-02-14 NOTE — Telephone Encounter (Signed)
Call to Dr Hochrein's office requesting info on surgical clearance for patient.  Per Alyse Low, patient was just seen in December and she will send message to MD to see if patient is cleared without office visit. Info on procedure, date and our office fax number given.     Just an update.

## 2013-02-14 NOTE — Telephone Encounter (Signed)
Message copied by Jaymes Graff on Fri Feb 14, 2013  4:17 PM ------      Message from: [place], [place]: Thu Feb 13, 2013  5:30 PM      Regarding: Please call Dr. Vickki Muff office to see if patient needs a preop        Gay Filler,            Patient has not seen her PCP or Cardiology in preparation for surgery.  Will you please contact Dr. Rosezella [place] office to see if he needs to see her for cardiac clearance for surgery for her hysteroscopy with polypectomy and dilation and curettage.            Thanks.       ------

## 2013-02-14 NOTE — Telephone Encounter (Signed)
New message  Dr. Josefa Half would like surgical clearance on patient for Hysteroscopy and DNC. Please fax approval [phone] Attention Gay Filler.

## 2013-02-19 ENCOUNTER — Telehealth: Payer: Self-pay | Admitting: Cardiology

## 2013-02-19 NOTE — Telephone Encounter (Signed)
Returned call to Gay Filler at [employer].She stated patient is scheduled for a [facility] Tuesday 02/25/13.Stated patient never started plavix, needs surgical clearance from Dr.Hochrein.Message sent to [place].

## 2013-02-19 NOTE — Telephone Encounter (Signed)
The patient has no high risk findings.   She has a high functional level.  Therefore, based on ACC/AHA guidelines, the patient would be at acceptable risk for the planned procedure without further cardiovascular testing.

## 2013-02-19 NOTE — Telephone Encounter (Signed)
Malachy Mood called back to confirm info on surgery case and date scheduled. Advised her that patient told me she had never started the Plavix.  Malachy Mood will send message to Dr Percival Spanish and let us know when she receives response.

## 2013-02-19 NOTE — Telephone Encounter (Signed)
New message  Patient is having a DNC and the provider needs medical clearance [phone]. Patient states she is no longer taking blood thinner.

## 2013-02-19 NOTE — Telephone Encounter (Signed)
No response received. Call back to [facility]  At Dr Hochrein's office.  MD is out of the office this entire week, she will forward message to the nurse.

## 2013-02-20 ENCOUNTER — Other Ambulatory Visit: Payer: Self-pay

## 2013-02-20 MED ORDER — VALSARTAN 320 MG PO TABS: ORAL_TABLET | ORAL | Status: DC

## 2013-02-20 NOTE — Telephone Encounter (Signed)
Surgical release received via fax from Dr Percival Spanish. Sent to your office.   Anything else needed before surgery?

## 2013-02-20 NOTE — Telephone Encounter (Signed)
Last office note & Dr. Percival Spanish message of surgical clearance faxed to Dr. Quincy Simmonds office. Horton Chin RN

## 2013-02-20 NOTE — Telephone Encounter (Signed)
Clearance faxed to [facility] as requested

## 2013-02-20 NOTE — Telephone Encounter (Signed)
PAT appointment is scheduled for 02-21-13 per appointment not in EPIC.  Encounter closed.

## 2013-02-20 NOTE — Telephone Encounter (Signed)
Patient just needs her preop with anesthesia before the day of surgery if this is not already scheduled or done.

## 2013-02-21 ENCOUNTER — Encounter (HOSPITAL_COMMUNITY)
Admission: RE | Admit: 2013-02-21 | Discharge: 2013-02-21 | Disposition: A | Discharge status: Home / Self Care | Payer: Medicare Other | Source: Ambulatory Visit | Attending: Obstetrics and Gynecology | Admitting: Obstetrics and Gynecology

## 2013-02-21 ENCOUNTER — Encounter (HOSPITAL_COMMUNITY): Payer: Self-pay

## 2013-02-21 DIAGNOSIS — Z01812 Encounter for preprocedural laboratory examination: Secondary | ICD-10-CM | POA: Insufficient documentation

## 2013-02-21 DIAGNOSIS — Z01811 Encounter for preprocedural respiratory examination: Secondary | ICD-10-CM | POA: Insufficient documentation

## 2013-02-21 HISTORY — DX: Other specified soft tissue disorders: M79.89

## 2013-02-21 HISTORY — DX: Nausea with vomiting, unspecified: Z98.890

## 2013-02-21 HISTORY — DX: Chronic kidney disease, unspecified: N18.9

## 2013-02-21 HISTORY — DX: Malignant (primary) neoplasm, unspecified: C80.1

## 2013-02-21 HISTORY — DX: Nausea with vomiting, unspecified: R11.2

## 2013-02-21 HISTORY — DX: Other specified postprocedural states: R11.2

## 2013-02-21 LAB — CBC
HCT: 35.3 % — ABNORMAL LOW (ref 36.0–46.0)
Hemoglobin: 11.5 g/dL — ABNORMAL LOW (ref 12.0–15.0)
MCH: 25.6 pg — ABNORMAL LOW (ref 26.0–34.0)
MCHC: 32.6 g/dL (ref 30.0–36.0)
MCV: 78.6 fL (ref 78.0–100.0)
PLATELETS: 226 10*3/uL (ref 150–400)
RBC: 4.49 MIL/uL (ref 3.87–5.11)
RDW: 13.7 % (ref 11.5–15.5)
WBC: 6.7 10*3/uL (ref 4.0–10.5)

## 2013-02-21 LAB — BASIC METABOLIC PANEL
BUN: 17 mg/dL (ref 6–23)
CO2: 25 mEq/L (ref 19–32)
Calcium: 10 mg/dL (ref 8.4–10.5)
Chloride: 101 mEq/L (ref 96–112)
Creatinine, Ser: 0.61 mg/dL (ref 0.50–1.10)
GFR, EST NON AFRICAN AMERICAN: 89 mL/min — AB (ref >90)
Glucose, Bld: 105 mg/dL — ABNORMAL HIGH (ref 70–99)
Potassium: 4.4 mEq/L (ref 3.7–5.3)
Sodium: 139 mEq/L (ref 137–147)

## 2013-02-21 NOTE — Patient Instructions (Addendum)
Your procedure is scheduled on: Tuesday, February 25, 2013  Enter through the [employer] [facility] at: [place] up the phone at the desk and dial [phone].  Call this number if you have problems the morning of surgery: [phone].  Remember: Do NOT eat food: AFTER MIDNIGHT MONDAY Do NOT drink clear liquids after: AFTER MIDNIGHT MONDAY Take these medicines the morning of surgery with a SIP OF WATER:  NEXIUM, METOPROLOL, DIOVAN *DO NOT TAKE METFORMIN AFTER SUNDAY NIGHT  Do NOT wear jewelry (body piercing), make-up, or nail polish. Do NOT wear lotions, powders, or perfumes.  You may wear deoderant. Do NOT shave for 48 hours prior to surgery. Do NOT bring valuables to the hospital. Contacts, dentures, or bridgework may not be worn into surgery.  Have a responsible adult drive you home and stay with you for 24 hours after your procedure

## 2013-02-24 NOTE — H&P (Signed)
Brook E Amundson de Berton Lan, MD at 02/13/2013  3:56 PM      Status: Signed            [name], female   DOB: March 08, 1940, 73 y.o.   MRN: [id number] GYNECOLOGY PROBLEM VISIT  PCP:    Renato Shin, MD  Referring provider:   HPI: 73 y.o.   Married  Caucasian  female    G0P0 with Patient's last menstrual period was 02/07/1992.    here for  Discuss surgery.   Thickened endometrium 5 x 8 mm on pelvic ultrasound ordered for abdominal bloating and left lower quadrant pain.   1 cm intramural fibroid noted and ovaries normal. Unable to do office endometrial biopsy due to patient intolerance of procedure and nulliparous cervix.  Patient has a history of an endometrial biopsy in [place] over 10 years ago prior to starting on HRT.   Not currently on HRT.   Not taking Plavix.   GYNECOLOGIC HISTORY: Patient's last menstrual period was 02/07/1992. Sexually active:  no Partner preference: female Contraception:   postmenopausal Menopausal hormone therapy: no DES exposure:   no Blood transfusions: no    Sexually transmitted diseases:   no GYN Procedures:  Laparoscopy 1993--(incidentally had gallbladder removed) Mammogram:   04-10-12 wnl:[facility]             Pap:   07-13-10 wnl History of abnormal pap smear:  15-20 years ago had 1 abnormal pap.  No colposcopy and no treatment.  Repeat pap normal.    OB History     Grav  Para  Term  Preterm  Abortions  TAB  SAB  Ect  Mult  Living     0                               Family History   Problem  Relation  Age of Onset   .  Diabetes  Mother     .  Stroke  Father         deceased age 57   .  Heart disease  Sister         deceased MI age 4   .  Heart disease  Brother         deceased MI age 67   .  Colon cancer  Neg Hx     .  Esophageal cancer  Neg Hx     .  Stomach cancer  Neg Hx     .  Rectal cancer  Neg Hx         Patient Active Problem List     Diagnosis  Date Noted   .  Hyponatremia  01/09/2013   .   GERD (gastroesophageal reflux disease)  01/09/2013   .  Amaurosis fugax of left eye  10/16/2012   .  Amaurosis fugax  09/23/2012   .  Low back pain  06/03/2012   .  Vitamin D deficiency  03/11/2012   .  Bilateral hand pain  10/20/2011   .  Encounter for long-term (current) use of other medications  10/20/2011   .  IBS (irritable bowel syndrome)  08/14/2011   .  TIA (transient ischemic attack)  02/10/2011   .  Abnormal brain MRI  01/19/2011   .  Allergic rhinitis  10/01/2010   .  Myalgia  09/30/2010   .  Carcinoid tumor of stomach- history  of  09/29/2010   .  FUNDIC GLAND POLYPS OF STOMACH  03/18/2010   .  EPIGASTRIC PAIN chronic  03/18/2010   .  ARTHRALGIA  03/17/2009   .  SYSTOLIC MURMUR  [id number]   .  SUPRAVENTRICULAR TACHYCARDIA  01/12/2009   .  PLANTAR FASCIITIS  06/08/2008   .  CHEST PAIN  05/18/2008   .  Gastroparesis  12/18/2007   .  HYPERCHOLESTEROLEMIA  06/11/2007   .  DIABETES MELLITUS, TYPE II  09/05/2006   .  HYPERTENSION  09/05/2006       Past Medical History   Diagnosis  Date   .  Gastroparesis     .  Iron deficiency anemia, unspecified     .  Pure hypercholesterolemia     .  Headache(784.0)     .  Unspecified essential hypertension     .  Esophageal reflux     .  Type II or unspecified type diabetes mellitus without mention of complication, not stated as uncontrolled     .  Arthritis         Spinal Osteoarthritis   .  Diverticulosis  08/30/2000       Colonoscopy    .  Gastric polyp         Fundic Gland   .  Anxiety     .  Cataract     .  Heart murmur         Echocardiogram 2/11: EF 60-65%, mild LAE, grade 1 diastolic dysfunction, aortic valve sclerosis, mean gradient 9 mm of mercury, PASP 34   .  Stroke         tia   .  Chest pain         Myoview 1/11: EF 82%, no ischemia.   Marland Kitchen  PSVT (paroxysmal supraventricular tachycardia)     .  Carcinoid tumor of stomach         Past Surgical History   Procedure  Laterality  Date   .  Cholecystectomy    1993    .  Tonsillectomy       .  Colonoscopy    11/11/2010       diverticulosis   .  Esophagogastroduodenoscopy    08/29/2010; 09/15/2010       Carcinoid tumor less than 1 cm in July 2012 not seen in August 2012 , gastritis, fundic gland polyps   .  Eus    12/15/2010       Procedure: UPPER ENDOSCOPIC ULTRASOUND (EUS) LINEAR;  Surgeon: Owens Loffler, MD;  Location: [facility];  Service: Endoscopy;  Laterality: N/A;   .  Esophagogastroduodenoscopy    05/16/2011   .  Esophagogastroduodenoscopy    06/14/2012     ALLERGIES: Tramadol    Current Outpatient Prescriptions   Medication  Sig  Dispense  Refill   .  amLODipine (NORVASC) 5 MG tablet  Take one tablet by mouth daily.   90 tablet   2   .  AZASITE 1 % ophthalmic solution  Place 1 drop into the right eye daily.         .  cholecalciferol (VITAMIN D) 1000 UNITS tablet  Take 1,000 Units by mouth daily.           Marland Kitchen  dicyclomine (BENTYL) 10 MG capsule  Take 1 capsule (10 mg total) by mouth every 6 (six) hours as needed (abdominal pain- may take 2 capsules if 1 ineffective).   60 capsule   0   .  DIOVAN 320 MG tablet  Take 1 tablet by mouth  daily   90 tablet   0   .  esomeprazole (NEXIUM) 40 MG capsule  Take 1 capsule (40 mg total) by mouth daily before breakfast.   90 capsule   3   .  folic acid-pyridoxine-cyancobalamin (FOLBIC) 2.5-25-2 MG TABS  Take 1 tablet by mouth daily.   90 tablet   3   .  metFORMIN (GLUCOPHAGE-XR) 500 MG 24 hr tablet  Take 2 tablets (1,000 mg total) by mouth 2 (two) times daily.   180 tablet   1   .  metoCLOPramide (REGLAN) 5 MG tablet  Take 5 mg by mouth 4 (four) times daily. Take one tablet by mouth before each meal and every night at bedtrime         .  metoprolol tartrate (LOPRESSOR) 25 MG tablet  Take 25 mg by mouth 2 (two) times daily.         .  Multiple Vitamin (MULTIVITAMIN) tablet  Take 1 tablet by mouth daily.           .  ondansetron (ZOFRAN) 4 MG tablet  Take 4 mg by mouth every 4 (four) hours as needed. For nausea            .  potassium chloride (K-DUR,KLOR-CON) 10 MEQ tablet  Take 1 tablet (10 mEq total) by mouth 2 (two) times daily.   180 tablet   3   .  rosuvastatin (CRESTOR) 10 MG tablet  Take 1 tablet (10 mg total) by mouth daily.   30 tablet   11   .  clopidogrel (PLAVIX) 75 MG tablet  Take 1 tablet (75 mg total) by mouth daily.   90 tablet   4   .  Ferrous Sulfate (IRON) 28 MG TABS  Take by mouth daily.           .  metoprolol tartrate (LOPRESSOR) 25 MG tablet  Take 1 tablet (25 mg total) by mouth 2 (two) times  daily.   180 tablet   0      No current facility-administered medications for this visit.      ROS:  Pertinent items are noted in HPI.  SOCIAL HISTORY:  Married.  Cares for her husband.   PHYSICAL EXAMINATION:    BP 122/60  Pulse 64  Ht 5' (1.524 m)  Wt 178 lb (80.74 kg)  BMI 34.76 kg/m2  LMP 02/07/1992    Wt Readings from Last 3 Encounters:   02/13/13  178 lb (80.74 kg)   01/15/13  176 lb (79.833 kg)   01/09/13  176 lb 9.6 oz (80.105 kg)       Ht Readings from Last 3 Encounters:   02/13/13  5' (1.524 m)   01/15/13  5' (1.524 m)   01/09/13  5\' 1"  (1.549 m)     General appearance: alert, cooperative and appears stated age Head: Normocephalic, without obvious abnormality, atraumatic Neck: no adenopathy, supple, symmetrical, trachea midline and thyroid not enlarged, symmetric, no tenderness/mass/nodules Lungs: clear to auscultation bilaterally Breasts: Inspection negative, No nipple retraction or dimpling, No nipple discharge or bleeding, No axillary or supraclavicular adenopathy, Normal to palpation without dominant masses Heart: regular rate and rhythm Abdomen: soft, non-tender; no masses,  no organomegaly Extremities: extremities normal, atraumatic, no cyanosis or edema Skin: Skin color, texture, turgor normal. No rashes or lesions Lymph nodes: Cervical, supraclavicular, and axillary nodes normal. No abnormal inguinal nodes palpated Neurologic: Grossly normal  Pelvic:  External genitalia:  no lesions              Urethra:  normal appearing urethra with no masses, tenderness or lesions              Bartholins and Skenes: normal                  Vagina: normal appearing vagina with normal color and discharge, no lesions              Cervix: normal appearance                  Bimanual Exam:  Uterus:  uterus is normal size, shape, consistency and nontender                                      Adnexa: normal adnexa in size, nontender and no masses                                      Rectovaginal: Confirms                                      Anus:  normal sphincter tone, no lesions  ASSESSMENT  Thickened endometrium. Unable to do office sampling.  PLAN  Proceed with hysteroscopy with anticipated polypectomy and dilation and curettage.  Risks, benefits, and alternatives discussed with the patient who wishes to proceed. No NSAIDs, or ASA for 10 days prior to procedure.   Our office will contact Dr. Percival Spanish to see if patient needs cardiac clearance appointment prior to her surgery. Patient will contact the hospital to schedule her preop visit there with anesthesia and nurse.  50 minutes face to face time of which over 50% was spent in counseling.      An After Visit Summary was printed and given to the patient.

## 2013-02-25 ENCOUNTER — Ambulatory Visit (HOSPITAL_COMMUNITY): Payer: Medicare Other | Admitting: Anesthesiology

## 2013-02-25 ENCOUNTER — Encounter (HOSPITAL_COMMUNITY): Payer: Medicare Other | Admitting: Anesthesiology

## 2013-02-25 ENCOUNTER — Ambulatory Visit (HOSPITAL_COMMUNITY)
Admission: RE | Admit: 2013-02-25 | Discharge: 2013-02-26 | Disposition: A | Discharge status: Home / Self Care | Payer: Medicare Other | Source: Ambulatory Visit | Attending: Obstetrics and Gynecology | Admitting: Obstetrics and Gynecology

## 2013-02-25 ENCOUNTER — Encounter (HOSPITAL_COMMUNITY)
Admission: RE | Disposition: A | Discharge status: Home / Self Care | Payer: Self-pay | Source: Ambulatory Visit | Attending: Obstetrics and Gynecology

## 2013-02-25 ENCOUNTER — Encounter (HOSPITAL_COMMUNITY): Payer: Self-pay | Admitting: Anesthesiology

## 2013-02-25 DIAGNOSIS — R011 Cardiac murmur, unspecified: Secondary | ICD-10-CM | POA: Insufficient documentation

## 2013-02-25 DIAGNOSIS — R0789 Other chest pain: Secondary | ICD-10-CM | POA: Insufficient documentation

## 2013-02-25 DIAGNOSIS — N8500 Endometrial hyperplasia, unspecified: Secondary | ICD-10-CM | POA: Diagnosis not present

## 2013-02-25 DIAGNOSIS — K219 Gastro-esophageal reflux disease without esophagitis: Secondary | ICD-10-CM | POA: Diagnosis not present

## 2013-02-25 DIAGNOSIS — D649 Anemia, unspecified: Secondary | ICD-10-CM | POA: Insufficient documentation

## 2013-02-25 DIAGNOSIS — IMO0002 Reserved for concepts with insufficient information to code with codable children: Secondary | ICD-10-CM | POA: Insufficient documentation

## 2013-02-25 DIAGNOSIS — D251 Intramural leiomyoma of uterus: Secondary | ICD-10-CM | POA: Diagnosis not present

## 2013-02-25 DIAGNOSIS — I472 Ventricular tachycardia, unspecified: Secondary | ICD-10-CM | POA: Insufficient documentation

## 2013-02-25 DIAGNOSIS — Y9210 Unspecified residential institution as the place of occurrence of the external cause: Secondary | ICD-10-CM | POA: Insufficient documentation

## 2013-02-25 DIAGNOSIS — I10 Essential (primary) hypertension: Secondary | ICD-10-CM | POA: Diagnosis not present

## 2013-02-25 DIAGNOSIS — N859 Noninflammatory disorder of uterus, unspecified: Secondary | ICD-10-CM | POA: Diagnosis not present

## 2013-02-25 DIAGNOSIS — E119 Type 2 diabetes mellitus without complications: Secondary | ICD-10-CM | POA: Diagnosis not present

## 2013-02-25 DIAGNOSIS — D509 Iron deficiency anemia, unspecified: Secondary | ICD-10-CM | POA: Diagnosis not present

## 2013-02-25 DIAGNOSIS — S3760XA Unspecified injury of uterus, initial encounter: Secondary | ICD-10-CM | POA: Insufficient documentation

## 2013-02-25 DIAGNOSIS — N189 Chronic kidney disease, unspecified: Secondary | ICD-10-CM | POA: Diagnosis not present

## 2013-02-25 DIAGNOSIS — I4729 Other ventricular tachycardia: Secondary | ICD-10-CM | POA: Diagnosis not present

## 2013-02-25 DIAGNOSIS — E669 Obesity, unspecified: Secondary | ICD-10-CM | POA: Insufficient documentation

## 2013-02-25 DIAGNOSIS — N289 Disorder of kidney and ureter, unspecified: Secondary | ICD-10-CM | POA: Diagnosis not present

## 2013-02-25 DIAGNOSIS — N840 Polyp of corpus uteri: Secondary | ICD-10-CM | POA: Insufficient documentation

## 2013-02-25 DIAGNOSIS — N9489 Other specified conditions associated with female genital organs and menstrual cycle: Secondary | ICD-10-CM

## 2013-02-25 DIAGNOSIS — J9819 Other pulmonary collapse: Secondary | ICD-10-CM | POA: Diagnosis not present

## 2013-02-25 DIAGNOSIS — R9389 Abnormal findings on diagnostic imaging of other specified body structures: Secondary | ICD-10-CM | POA: Diagnosis not present

## 2013-02-25 DIAGNOSIS — Z9889 Other specified postprocedural states: Secondary | ICD-10-CM

## 2013-02-25 HISTORY — PX: LAPAROSCOPY: SHX197

## 2013-02-25 HISTORY — PX: DILATATION & CURRETTAGE/HYSTEROSCOPY WITH RESECTOCOPE: SHX5572

## 2013-02-25 LAB — BASIC METABOLIC PANEL
BUN: 15 mg/dL (ref 6–23)
CALCIUM: 9.2 mg/dL (ref 8.4–10.5)
CO2: 27 meq/L (ref 19–32)
CREATININE: 0.64 mg/dL (ref 0.50–1.10)
Chloride: 99 mEq/L (ref 96–112)
GFR calc Af Amer: >90 mL/min (ref >90)
GFR calc non Af Amer: 88 mL/min — ABNORMAL LOW (ref >90)
GLUCOSE: 185 mg/dL — AB (ref 70–99)
Potassium: 4.3 mEq/L (ref 3.7–5.3)
Sodium: 138 mEq/L (ref 137–147)

## 2013-02-25 LAB — GLUCOSE, CAPILLARY
Glucose-Capillary: 146 mg/dL — ABNORMAL HIGH (ref 70–99)
Glucose-Capillary: 131 mg/dL — ABNORMAL HIGH (ref 70–99)

## 2013-02-25 LAB — PREPARE RBC (CROSSMATCH)

## 2013-02-25 LAB — CBC
HEMATOCRIT: 32.7 % — AB (ref 36.0–46.0)
Hemoglobin: 10.5 g/dL — ABNORMAL LOW (ref 12.0–15.0)
MCH: 25.3 pg — ABNORMAL LOW (ref 26.0–34.0)
MCHC: 32.1 g/dL (ref 30.0–36.0)
MCV: 78.8 fL (ref 78.0–100.0)
Platelets: 190 10*3/uL (ref 150–400)
RBC: 4.15 MIL/uL (ref 3.87–5.11)
RDW: 13.8 % (ref 11.5–15.5)
WBC: 9.3 10*3/uL (ref 4.0–10.5)

## 2013-02-25 LAB — ABO/RH: ABO/RH(D): B POS

## 2013-02-25 SURGERY — DILATATION & CURETTAGE/HYSTEROSCOPY WITH RESECTOCOPE
Anesthesia: Monitor Anesthesia Care | Site: Uterus

## 2013-02-25 MED ORDER — MENTHOL 3 MG MT LOZG: 1.0000 | LOZENGE | OROMUCOSAL | Status: DC | PRN

## 2013-02-25 MED ORDER — HEPARIN SODIUM (PORCINE) 5000 UNIT/ML IJ SOLN
INTRAMUSCULAR | Status: AC
  Filled 2013-02-25: qty 1

## 2013-02-25 MED ORDER — LIDOCAINE HCL (CARDIAC) 20 MG/ML IV SOLN
INTRAVENOUS | Status: AC
  Filled 2013-02-25: qty 5

## 2013-02-25 MED ORDER — DIPHENHYDRAMINE HCL 50 MG/ML IJ SOLN
INTRAMUSCULAR | Status: DC | PRN
  Administered 2013-02-25 (×2): 12.5 mg via INTRAVENOUS

## 2013-02-25 MED ORDER — GLYCINE 1.5 % IR SOLN
Status: DC | PRN
  Administered 2013-02-25: 3000 mL

## 2013-02-25 MED ORDER — METOPROLOL TARTRATE 25 MG PO TABS
25.0000 mg | ORAL_TABLET | Freq: Two times a day (BID) | ORAL | Status: DC
  Administered 2013-02-25: 25 mg via ORAL
  Filled 2013-02-25 (×2): qty 1

## 2013-02-25 MED ORDER — METOCLOPRAMIDE HCL 5 MG/ML IJ SOLN
10.0000 mg | Freq: Once | INTRAMUSCULAR | Status: DC | PRN
Start: 2013-02-25 — End: 2013-02-25

## 2013-02-25 MED ORDER — KETOROLAC TROMETHAMINE 30 MG/ML IJ SOLN
INTRAMUSCULAR | Status: AC
  Filled 2013-02-25: qty 1

## 2013-02-25 MED ORDER — METOCLOPRAMIDE HCL 5 MG PO TABS
5.0000 mg | ORAL_TABLET | Freq: Three times a day (TID) | ORAL | Status: DC
  Administered 2013-02-26: 10 mg via ORAL
  Filled 2013-02-25 (×4): qty 1

## 2013-02-25 MED ORDER — FENTANYL CITRATE 0.05 MG/ML IJ SOLN
INTRAMUSCULAR | Status: DC | PRN
  Administered 2013-02-25 (×4): 25 ug via INTRAVENOUS

## 2013-02-25 MED ORDER — FENTANYL CITRATE 0.05 MG/ML IJ SOLN
INTRAMUSCULAR | Status: AC
  Administered 2013-02-25: 25 ug via INTRAVENOUS
  Filled 2013-02-25: qty 2

## 2013-02-25 MED ORDER — NEOSTIGMINE METHYLSULFATE 1 MG/ML IJ SOLN
INTRAMUSCULAR | Status: AC
  Filled 2013-02-25: qty 1

## 2013-02-25 MED ORDER — FA-PYRIDOXINE-CYANOCOBALAMIN 2.5-25-2 MG PO TABS
1.0000 | ORAL_TABLET | Freq: Every day | ORAL | Status: DC
  Filled 2013-02-25 (×2): qty 1

## 2013-02-25 MED ORDER — DEXTROSE 5 % IV SOLN
2.0000 g | Freq: Two times a day (BID) | INTRAVENOUS | Status: DC
  Administered 2013-02-25: 2 g via INTRAVENOUS
  Filled 2013-02-25: qty 2

## 2013-02-25 MED ORDER — LACTATED RINGERS IV SOLN: INTRAVENOUS | Status: DC

## 2013-02-25 MED ORDER — ONDANSETRON HCL 4 MG/2ML IJ SOLN
INTRAMUSCULAR | Status: AC
  Filled 2013-02-25: qty 2

## 2013-02-25 MED ORDER — PROPOFOL 10 MG/ML IV EMUL
INTRAVENOUS | Status: AC
  Filled 2013-02-25: qty 20

## 2013-02-25 MED ORDER — PHENYLEPHRINE 40 MCG/ML (10ML) SYRINGE FOR IV PUSH (FOR BLOOD PRESSURE SUPPORT)
PREFILLED_SYRINGE | INTRAVENOUS | Status: AC
  Filled 2013-02-25: qty 5

## 2013-02-25 MED ORDER — PROPOFOL 10 MG/ML IV EMUL
INTRAVENOUS | Status: DC | PRN
  Administered 2013-02-25: 30 mg via INTRAVENOUS
  Administered 2013-02-25: 5 mg via INTRAVENOUS
  Administered 2013-02-25: 30 mg via INTRAVENOUS
  Administered 2013-02-25: 5 mg via INTRAVENOUS
  Administered 2013-02-25: 150 mg via INTRAVENOUS
  Administered 2013-02-25 (×2): 10 mg via INTRAVENOUS
  Administered 2013-02-25: 110 mg via INTRAVENOUS
  Administered 2013-02-25: 30 mg via INTRAVENOUS
  Administered 2013-02-25: 20 mg via INTRAVENOUS

## 2013-02-25 MED ORDER — NEOSTIGMINE METHYLSULFATE 1 MG/ML IJ SOLN
INTRAMUSCULAR | Status: DC | PRN
  Administered 2013-02-25: 4 mg via INTRAVENOUS

## 2013-02-25 MED ORDER — ONDANSETRON HCL 4 MG/2ML IJ SOLN: 4.0000 mg | Freq: Four times a day (QID) | INTRAMUSCULAR | Status: DC | PRN

## 2013-02-25 MED ORDER — LACTATED RINGERS IV SOLN
INTRAVENOUS | Status: DC | PRN
  Administered 2013-02-25: 11:00:00 via INTRAVENOUS

## 2013-02-25 MED ORDER — LIDOCAINE HCL (CARDIAC) 20 MG/ML IV SOLN
INTRAVENOUS | Status: DC | PRN
  Administered 2013-02-25: 20 mg via INTRAVENOUS
  Administered 2013-02-25: 30 mg via INTRAVENOUS

## 2013-02-25 MED ORDER — DEXTROSE 5 % IV SOLN
2.0000 g | Freq: Two times a day (BID) | INTRAVENOUS | Status: AC
  Administered 2013-02-26: 2 g via INTRAVENOUS
  Filled 2013-02-25: qty 2

## 2013-02-25 MED ORDER — PANTOPRAZOLE SODIUM 40 MG PO TBEC
40.0000 mg | DELAYED_RELEASE_TABLET | Freq: Every day | ORAL | Status: DC
  Filled 2013-02-25: qty 1

## 2013-02-25 MED ORDER — HYDROMORPHONE HCL PF 1 MG/ML IJ SOLN: 0.5000 mg | INTRAMUSCULAR | Status: DC | PRN

## 2013-02-25 MED ORDER — FENTANYL CITRATE 0.05 MG/ML IJ SOLN
INTRAMUSCULAR | Status: AC
  Filled 2013-02-25: qty 2

## 2013-02-25 MED ORDER — GLYCOPYRROLATE 0.2 MG/ML IJ SOLN
INTRAMUSCULAR | Status: DC | PRN
  Administered 2013-02-25: 0.6 mg via INTRAVENOUS

## 2013-02-25 MED ORDER — ACETAMINOPHEN 500 MG PO TABS
1000.0000 mg | ORAL_TABLET | Freq: Four times a day (QID) | ORAL | Status: DC | PRN
  Administered 2013-02-25: 1000 mg via ORAL
  Filled 2013-02-25: qty 2

## 2013-02-25 MED ORDER — PHENYLEPHRINE HCL 10 MG/ML IJ SOLN
INTRAMUSCULAR | Status: DC | PRN
  Administered 2013-02-25: 40 ug via INTRAVENOUS
  Administered 2013-02-25: 30 ug via INTRAVENOUS
  Administered 2013-02-25 (×2): 40 ug via INTRAVENOUS
  Administered 2013-02-25: 50 ug via INTRAVENOUS

## 2013-02-25 MED ORDER — FENTANYL CITRATE 0.05 MG/ML IJ SOLN
25.0000 ug | INTRAMUSCULAR | Status: DC | PRN
  Administered 2013-02-25 (×5): 25 ug via INTRAVENOUS

## 2013-02-25 MED ORDER — ONDANSETRON HCL 4 MG/2ML IJ SOLN
INTRAMUSCULAR | Status: DC | PRN
  Administered 2013-02-25: 4 mg via INTRAVENOUS

## 2013-02-25 MED ORDER — MIDAZOLAM HCL 2 MG/2ML IJ SOLN
INTRAMUSCULAR | Status: AC
  Filled 2013-02-25: qty 2

## 2013-02-25 MED ORDER — TEMAZEPAM 15 MG PO CAPS: 15.0000 mg | ORAL_CAPSULE | Freq: Every evening | ORAL | Status: DC | PRN

## 2013-02-25 MED ORDER — ONDANSETRON HCL 4 MG PO TABS: 4.0000 mg | ORAL_TABLET | ORAL | Status: DC | PRN

## 2013-02-25 MED ORDER — GLYCOPYRROLATE 0.2 MG/ML IJ SOLN
INTRAMUSCULAR | Status: AC
  Filled 2013-02-25: qty 3

## 2013-02-25 MED ORDER — LACTATED RINGERS IV SOLN
INTRAVENOUS | Status: DC
  Administered 2013-02-25 (×2): via INTRAVENOUS

## 2013-02-25 MED ORDER — BUPIVACAINE HCL (PF) 0.25 % IJ SOLN
INTRAMUSCULAR | Status: AC
  Filled 2013-02-25: qty 30

## 2013-02-25 MED ORDER — MEPERIDINE HCL 25 MG/ML IJ SOLN: 6.2500 mg | INTRAMUSCULAR | Status: DC | PRN

## 2013-02-25 MED ORDER — AMLODIPINE BESYLATE 5 MG PO TABS
5.0000 mg | ORAL_TABLET | Freq: Every day | ORAL | Status: DC
  Filled 2013-02-25 (×2): qty 1

## 2013-02-25 MED ORDER — ROCURONIUM BROMIDE 100 MG/10ML IV SOLN
INTRAVENOUS | Status: DC | PRN
  Administered 2013-02-25: 10 mg via INTRAVENOUS
  Administered 2013-02-25: 30 mg via INTRAVENOUS

## 2013-02-25 MED ORDER — PHENYLEPHRINE 40 MCG/ML (10ML) SYRINGE FOR IV PUSH (FOR BLOOD PRESSURE SUPPORT)
PREFILLED_SYRINGE | INTRAVENOUS | Status: AC
Start: 2013-02-25 — End: 2013-02-25
  Filled 2013-02-25: qty 5

## 2013-02-25 MED ORDER — LIDOCAINE HCL 1 % IJ SOLN
INTRAMUSCULAR | Status: DC | PRN
  Administered 2013-02-25: 10 mL

## 2013-02-25 MED ORDER — ROCURONIUM BROMIDE 100 MG/10ML IV SOLN
INTRAVENOUS | Status: AC
  Filled 2013-02-25: qty 1

## 2013-02-25 MED ORDER — LACTATED RINGERS IV SOLN
INTRAVENOUS | Status: DC
  Administered 2013-02-25 – 2013-02-26 (×3): via INTRAVENOUS

## 2013-02-25 MED ORDER — LIDOCAINE HCL 1 % IJ SOLN
INTRAMUSCULAR | Status: AC
  Filled 2013-02-25: qty 20

## 2013-02-25 MED ORDER — IBUPROFEN 600 MG PO TABS: 600.0000 mg | ORAL_TABLET | Freq: Four times a day (QID) | ORAL | Status: DC | PRN

## 2013-02-25 MED ORDER — ONDANSETRON HCL 4 MG PO TABS: 4.0000 mg | ORAL_TABLET | Freq: Four times a day (QID) | ORAL | Status: DC | PRN

## 2013-02-25 SURGICAL SUPPLY — 44 items
ADH SKN CLS APL DERMABOND .7 (GAUZE/BANDAGES/DRESSINGS)
APL SKNCLS STERI-STRIP NONHPOA (GAUZE/BANDAGES/DRESSINGS)
BENZOIN TINCTURE PRP APPL 2/3 (GAUZE/BANDAGES/DRESSINGS) IMPLANT
CABLE HIGH FREQUENCY MONO STRZ (ELECTRODE) IMPLANT
CANISTER SUCT 3000ML (MISCELLANEOUS) ×8 IMPLANT
CATH ROBINSON RED A/P 16FR (CATHETERS) ×4 IMPLANT
CLOSURE WOUND 1/4 X3 (GAUZE/BANDAGES/DRESSINGS)
CLOTH BEACON ORANGE TIMEOUT ST (SAFETY) ×8 IMPLANT
CONTAINER PREFILL 10% NBF 60ML (FORM) ×8 IMPLANT
DERMABOND ADVANCED (GAUZE/BANDAGES/DRESSINGS)
DERMABOND ADVANCED .7 DNX12 (GAUZE/BANDAGES/DRESSINGS) IMPLANT
DRSG TELFA 3X8 NADH (GAUZE/BANDAGES/DRESSINGS) ×4 IMPLANT
ELECT REM PT RETURN 9FT ADLT (ELECTROSURGICAL) ×4
ELECTRODE REM PT RTRN 9FT ADLT (ELECTROSURGICAL) IMPLANT
GLOVE BIO SURGEON STRL SZ 6.5 (GLOVE) ×9 IMPLANT
GLOVE BIO SURGEONS STRL SZ 6.5 (GLOVE) ×3
GLOVE BIOGEL PI IND STRL 7.0 (GLOVE) ×2 IMPLANT
GLOVE BIOGEL PI INDICATOR 7.0 (GLOVE) ×2
GOWN PREVENTION PLUS LG XLONG (DISPOSABLE) ×8 IMPLANT
GOWN STRL REIN XL XLG (GOWN DISPOSABLE) ×8 IMPLANT
LOOP ANGLED CUTTING 22FR (CUTTING LOOP) ×2 IMPLANT
NDL SPNL 20GX3.5 QUINCKE YW (NEEDLE) IMPLANT
NEEDLE SPNL 20GX3.5 QUINCKE YW (NEEDLE) ×4 IMPLANT
NS IRRIG 1000ML POUR BTL (IV SOLUTION) ×4 IMPLANT
PACK HYSTEROSCOPY LF (CUSTOM PROCEDURE TRAY) ×4 IMPLANT
PACK LAPAROSCOPY BASIN (CUSTOM PROCEDURE TRAY) ×4 IMPLANT
PAD DRESSING TELFA 3X8 NADH (GAUZE/BANDAGES/DRESSINGS) ×2 IMPLANT
PAD OB MATERNITY 4.3X12.25 (PERSONAL CARE ITEMS) ×4 IMPLANT
PROTECTOR NERVE ULNAR (MISCELLANEOUS) ×4 IMPLANT
SCISSORS LAP 5X35 DISP (ENDOMECHANICALS) IMPLANT
SEALER TISSUE G2 CVD JAW 35 (ENDOMECHANICALS) IMPLANT
SEALER TISSUE G2 CVD JAW 45CM (ENDOMECHANICALS)
SET IRRIG TUBING LAPAROSCOPIC (IRRIGATION / IRRIGATOR) IMPLANT
STRIP CLOSURE SKIN 1/4X3 (GAUZE/BANDAGES/DRESSINGS) IMPLANT
SUT PLAIN 3 0 FS 2 27 (SUTURE) ×4 IMPLANT
SUT VICRYL 0 UR6 27IN ABS (SUTURE) ×2 IMPLANT
SUT VICRYL 4-0 PS2 18IN ABS (SUTURE) ×2 IMPLANT
TOWEL OR 17X24 6PK STRL BLUE (TOWEL DISPOSABLE) ×16 IMPLANT
TRAY FOLEY CATH 14FR (SET/KITS/TRAYS/PACK) ×4 IMPLANT
TROCAR XCEL DIL TIP R 11M (ENDOMECHANICALS) IMPLANT
TROCAR XCEL NON-BLD 5MMX100MML (ENDOMECHANICALS) IMPLANT
TROCAR XCEL OPT SLVE 5M 100M (ENDOMECHANICALS) IMPLANT
WARMER LAPAROSCOPE (MISCELLANEOUS) ×4 IMPLANT
WATER STERILE IRR 1000ML POUR (IV SOLUTION) ×6 IMPLANT

## 2013-02-25 NOTE — Anesthesia Preprocedure Evaluation (Signed)
Anesthesia Evaluation  Patient identified by MRN, date of birth, ID band Patient awake    Reviewed: Allergy & Precautions, H&P , NPO status , Patient's Chart, lab work & pertinent test results  History of Anesthesia Complications (+) PONV and history of anesthetic complications  Airway Mallampati: III TM Distance: >3 FB Neck ROM: Full    Dental  (+) Partial Upper   Pulmonary  breath sounds clear to auscultation  Pulmonary exam normal       Cardiovascular hypertension, Pt. on medications and Pt. on home beta blockers + dysrhythmias Supra Ventricular Tachycardia + Valvular Problems/Murmurs Rhythm:Regular Rate:Normal     Neuro/Psych  Headaches, Anxiety TIACVA, No Residual Symptoms    GI/Hepatic Neg liver ROS, GERD-  Medicated and Controlled,Hx/o Carcinoid tumor of Stomach   Endo/Other  diabetes, Well Controlled, Type 2, Oral Hypoglycemic Agents  Renal/GU Renal disease  negative genitourinary   Musculoskeletal  (+) Arthritis -,   Abdominal (+) + obese,   Peds  Hematology  (+) anemia ,   Anesthesia Other Findings   Reproductive/Obstetrics Endometrial polyp Thickened endometrium                           Anesthesia Physical Anesthesia Plan  ASA: III  Anesthesia Plan: MAC   Post-op Pain Management:    Induction: Intravenous  Airway Management Planned: Natural Airway and Simple Face Mask  Additional Equipment:   Intra-op Plan:   Post-operative Plan:   Informed Consent: I have reviewed the patients History and Physical, chart, labs and discussed the procedure including the risks, benefits and alternatives for the proposed anesthesia with the patient or authorized representative who has indicated his/her understanding and acceptance.     Plan Discussed with: Anesthesiologist, CRNA and Surgeon  Anesthesia Plan Comments:         Anesthesia Quick Evaluation

## 2013-02-25 NOTE — Transfer of Care (Signed)
Immediate Anesthesia Transfer of Care Note  Patient: [name]  Procedure(s) Performed: Procedure(s): Attempted hysteroscopy with uterine perforation (N/A) Cystoscopy and laparoscopy with fulguration of uterine serosa (N/A)  Patient Location: PACU  Anesthesia Type:General  Level of Consciousness: awake, sedated, patient cooperative and lethargic  Airway & Oxygen Therapy: Patient Spontanous Breathing and Patient connected to nasal cannula oxygen  Post-op Assessment: Report given to PACU RN  Post vital signs: Reviewed and stable  Complications: No apparent anesthesia complications

## 2013-02-25 NOTE — Anesthesia Postprocedure Evaluation (Signed)
  Anesthesia Post-op Note  Patient: [name]  Procedure(s) Performed: Procedure(s): Attempted hysteroscopy with uterine perforation (N/A) Cystoscopy and laparoscopy with fulguration of uterine serosa (N/A) Patient is awake and responsive. Pain and nausea are reasonably well controlled. Vital signs are stable and clinically acceptable. Repeat labs reported to be fine. Oxygen saturation is clinically acceptable. There are no apparent anesthetic complications at this time. Patient is ready for discharge.

## 2013-02-25 NOTE — Brief Op Note (Signed)
02/25/2013  11:50 AM  PATIENT:  [name]  73 y.o. female  PRE-OPERATIVE DIAGNOSIS:  1.  Endometrial mass/endometrial thickening  POST-OPERATIVE DIAGNOSIS:   1.  Endometrial mass/endometrial thickening 2.  Uterine fundal perforation.   PROCEDURE:  Procedure(s): Attempted hysteroscopy with uterine perforation (N/A) Cystoscopy and laparoscopy with fulguration of uterine serosa (N/A)  SURGEON:  Surgeon(s) and Role:    * [place], MD - Primary  PHYSICIAN ASSISTANT:   ASSISTANTS:     ANESTHESIA:   local, general and  LMA  EBL:  Total I/O In: 1800 [I.V.:1800] Out: 180 [Urine:130; Blood:50]  BLOOD ADMINISTERED:none  DRAINS: Urinary Catheter (Foley)   LOCAL MEDICATIONS USED:  LIDOCAINE   SPECIMEN:  No Specimen  DISPOSITION OF SPECIMEN:  N/A  COUNTS:  YES  TOURNIQUET:  * No tourniquets in log *  DICTATION: .Other Dictation: Dictation Number    PLAN OF CARE: Admit for overnight observation  PATIENT DISPOSITION:  PACU - hemodynamically stable.   Delay start of Pharmacological VTE agent (>24hrs) due to surgical blood loss or risk of bleeding: not applicable

## 2013-02-25 NOTE — Anesthesia Postprocedure Evaluation (Signed)
Anesthesia Post Note  Patient: [name]  Procedure(s) Performed: Procedure(s): Attempted hysteroscopy with uterine perforation (N/A) Cystoscopy and laparoscopy with fulguration of uterine serosa (N/A)  Anesthesia type: General  Patient location: [facility]  Post pain: Pain level controlled  Post assessment: Post-op Vital signs reviewed  Last Vitals: BP 127/55  Pulse 78  Temp(Src) 36.9 C (Oral)  Resp 16  Ht 5' (1.524 m)  Wt 176 lb (79.833 kg)  BMI 34.37 kg/m2  SpO2 99%  LMP 02/07/1992  Post vital signs: Reviewed  Level of consciousness: awake  Complications: No apparent anesthesia complications

## 2013-02-25 NOTE — Progress Notes (Signed)
Patient was approved for brand name diovan 320  mg until 12.31.15  ID# [id number]    NDC# [id number]

## 2013-02-25 NOTE — OR Nursing (Signed)
Called to waiting room and updated sister-in-law that all is well and that Dr. Quincy Simmonds would be going out to speak with her shortly

## 2013-02-25 NOTE — Progress Notes (Signed)
Update note  Patient OK'd from Dr. Percival Spanish for surgery. Capillary glucose today 146.  OK to proceed.

## 2013-02-25 NOTE — Progress Notes (Signed)
Day of Surgery Procedure(s) (LRB): Attempted hysteroscopy with uterine perforation (N/A) Cystoscopy and laparoscopy with fulguration of uterine serosa (N/A)  Subjective: Patient reports not hungry. Taking liquids by mouth. No ambulation yet.   Objective: I have reviewed patient's vital signs, intake and output, labs and  FSBG.. T 97.1   BP  140/63    P 81    RR  16 I/O - 2075/1355 cc - clear yellow urine.  FSBG now - 130s Serum labs in PACU -WBC 9.3, Hgb 10.5, Na 138, K 4.3, BUN 15, Cr 0.64, glucose 185.  General: alert and cooperative Resp: clear to auscultation bilaterally Cardio: regular rate and rhythm, S1, S2 normal, no murmur, click, rub or gallop GI: soft, non-tender; bowel sounds normal; no masses,  no organomegaly and  Incisions - intact with dermabond.  Right lower quadrant skin with erythema and skin abrasion. Extremities:  PAS on.  DPs 2+ bilaterally.  Vaginal Bleeding: none  Assessment: s/p Procedure(s): Attempted hysteroscopy with uterine perforation (N/A) Cystoscopy and laparoscopy with fulguration of uterine serosa (N/A): stable I suspect a tape reaction to the skin from the surgical LAVH drape.   Plan: Advance diet Encourage ambulation Continue foley due to  post op monitoring. Tylenol or Dilaudid tonight for pain depending on the patient's level of discomfort.  Cefotetan 2 gm IV due tonight as prophylaxis. FSBG q 6 hours.  No Metformin unless taking po well. CBC in am.  I discussed in detail the surgical findings, the uterine perforation, and the inability to get inside the uterine cavity to perform the endometrial biopsy/removal of the suspected polyp, and the need for the laparoscopy with fulguration of the uterine serosa.  I have invited and answered all questions. Patient will continue in hospital monitoring tonight with planned discharge tomorrow morning.  LOS: 0 days    Amundson de Berton Lan 02/25/2013, 6:51 PM

## 2013-02-26 ENCOUNTER — Encounter (HOSPITAL_COMMUNITY): Payer: Self-pay | Admitting: Obstetrics and Gynecology

## 2013-02-26 ENCOUNTER — Telehealth: Payer: Self-pay | Admitting: Obstetrics and Gynecology

## 2013-02-26 LAB — CBC
HCT: 29.1 % — ABNORMAL LOW (ref 36.0–46.0)
HEMOGLOBIN: 9.5 g/dL — AB (ref 12.0–15.0)
MCH: 25.6 pg — ABNORMAL LOW (ref 26.0–34.0)
MCHC: 32.6 g/dL (ref 30.0–36.0)
MCV: 78.4 fL (ref 78.0–100.0)
Platelets: 183 10*3/uL (ref 150–400)
RBC: 3.71 MIL/uL — ABNORMAL LOW (ref 3.87–5.11)
RDW: 13.9 % (ref 11.5–15.5)
WBC: 7.8 10*3/uL (ref 4.0–10.5)

## 2013-02-26 LAB — GLUCOSE, CAPILLARY
GLUCOSE-CAPILLARY: 196 mg/dL — AB (ref 70–99)
Glucose-Capillary: 115 mg/dL — ABNORMAL HIGH (ref 70–99)

## 2013-02-26 MED ORDER — METFORMIN HCL ER 500 MG PO TB24
1000.0000 mg | ORAL_TABLET | Freq: Two times a day (BID) | ORAL | Status: DC
  Administered 2013-02-26: 1000 mg via ORAL
  Filled 2013-02-26: qty 2

## 2013-02-26 MED ORDER — CLOPIDOGREL BISULFATE 75 MG PO TABS: 75.0000 mg | ORAL_TABLET | Freq: Every day | ORAL | Status: DC

## 2013-02-26 MED ORDER — ACETAMINOPHEN 500 MG PO TABS: 1000.0000 mg | ORAL_TABLET | Freq: Four times a day (QID) | ORAL | Status: DC | PRN

## 2013-02-26 MED ORDER — MENTHOL 3 MG MT LOZG: 1.0000 | LOZENGE | OROMUCOSAL | Status: DC | PRN

## 2013-02-26 NOTE — Progress Notes (Signed)
Pt is discharged in the care of [facility] Denies any pain or discomfort. Discharged instructions with Rx were given to pt with good comprensions Questions were asked and answered.Pt is aware of appointment on Friday.

## 2013-02-26 NOTE — Progress Notes (Signed)
1 Day Post-Op Procedure(s) (LRB): Attempted hysteroscopy with uterine perforation (N/A) Cystoscopy and laparoscopy with fulguration of uterine serosa (N/A)  Subjective: Patient reports tolerating PO, + flatus and no problems voiding.   Some chest discomfort with deep inspiration.  Gas pain.  Objective: I have reviewed patient's vital signs, intake and output and labs.  T 98.2   BP  135/61   P 66   RR 16 I/O - 3980/2755 cc Hgb 9.5  General: alert and cooperative Resp:  rales in bases bilaterally. Cardio: regular rate and rhythm, S1, S2 normal, no murmur, click, rub or gallop GI: soft, non-tender; bowel sounds normal; no masses,  no organomegaly, incision: clean, dry and intact and  Erythema like bruises x above pubic hair line. Extremities:  PAS on.  DP 2+ bilaterally.  Vaginal Bleeding: none  Assessment: s/p Procedure(s): Attempted hysteroscopy with uterine perforation (N/A) Cystoscopy and laparoscopy with fulguration of uterine serosa (N/A): progressing well Atelectasis on examination. Anemia likely due to fluid shifting and IVF.  No signs of intraperitoneal bleeding clinically.   Plan: Encourage ambulation Discharge home  Encourage incentive spirometry. I have again reviewed the perforation of the uterus and the inability to do the endometrial resection of the mass/suspected polyp. I have reviewed precautions and instructions with patient.  Tylenol for pain.  Declines other Rx. Resume usual medications. Will follow up in two days for office visit and Hgb recheck, sooner as needed.   LOS: 1 day    Amundson de Berton Lan 02/26/2013, 9:49 AM

## 2013-02-26 NOTE — Telephone Encounter (Signed)
Pt says she was in the hospital and Dr Quincy Simmonds would like to see her Friday.

## 2013-02-26 NOTE — Op Note (Signed)
[name], [name] NO.:  [id number]  MEDICAL RECORD NO.:  [id number]  LOCATION:  0626                          FACILITY:  [facility]  PHYSICIAN:  Lenard Galloway, M.D.   DATE OF BIRTH:  24-Nov-1940  DATE OF PROCEDURE:  02/25/2013 DATE OF DISCHARGE:                              OPERATIVE REPORT   PREOPERATIVE DIAGNOSIS:  Endometrial thickening, suspected endometrial polyp.  POSTOPERATIVE DIAGNOSES: 1. Endometrial thickening, suspected endometrial polyp. 2. Uterine fundal perforation.  PROCEDURE:  Examination under anesthesia, attempted hysteroscopy with fundal uterine perforation, cystoscopy, laparoscopy with fulguration of the uterine fundal serosa.  SURGEON:  Lenard Galloway, M.D.  ANESTHESIA:  Paracervical block with 1% lidocaine, IV sedation, which transitioned into LMA and then ultimately general endotracheal anesthesia.  IV FLUIDS:  1800 mL Ringer's lactate.  GLYCINE DEFICIT:  500 ml.  EBL:  50 mL.  URINE OUTPUT:  130 mL.  COMPLICATIONS:  Uterine perforation.  INDICATIONS FOR THE PROCEDURE:  The patient is a 73 year old, gravida 0, para 80 female who presented with left lower quadrant pain.  The patient had an office ultrasound documenting a thickened endometrium with an area of the uterine fundus which was suspicious for an endometrial polyp.  The patient was also noted to have an 1 cm intramural fibroid. Her ovaries were unremarkable.  An attempt was made to perform an office endometrial biopsy for diagnosis of the endometrial mass, however, this was poorly tolerated and the procedure was abandoned in the office. Recommendation was made to proceed with a hysteroscopy with dilation and curettage and expected polypectomy.  Risks, benefits, and alternatives were reviewed with the patient who wished to proceed.  FINDINGS:  Examination under anesthesia revealed a small mid position mobile uterus.  No adnexal masses were appreciated.  The cervix  was noted to be nulliparous.  After the uterine cervix was dilated and the small diagnostic hysteroscope was placed, no recognizable uterine landmarks were appreciated.  The glycine deficit was 500 cc, and the uterine perforation was suspected.  As the patient underwent laparoscopy, a uterine serosal perforation on the patient's left-hand side was confirmed.  This was not bleeding.  The patient did have a pelvis consistent with adhesive disease around the right adnexal region.  She had areas over the anterior cul-de-sac and peritoneum which looked like potential old endometriosis.  The right ovary and fallopian tube were adherent to the right pelvic sidewall. The left fallopian tube and ovary were noted to be free.  In the upper abdomen, the liver appeared to be unremarkable.  The bowel over the rectal region appeared to be also unremarkable.  Cystoscopy demonstrated the bladder and urethra to be intact.  The bladder was visualized throughout 360 degrees including the bladder dome and trigone.  The ureters were noted to be patent bilaterally.  SPECIMENS:  None.  DESCRIPTION OF PROCEDURE:  The patient was reidentified in the preoperative hold area.  She was brought to the operating room after receiving PAS stockings for DVT prophylaxis.  The patient was placed in the supine position on the operating room table.  IV sedation was performed and the patient was placed in the dorsal lithotomy position.  The patient proceeded to have a  LMA Anesthetic.  The vagina and perineum were sterilely prepped and the bladder was catheterized of urine.  She was sterilely draped.  The speculum was placed inside the vagina and a single-tooth tenaculum was placed over the anterior cervical lip.  A paracervical block was performed with a total of 10 mL, 1% lidocaine plain.  The cervix was noted to be nulliparous and stenotic and the small Hegar dilators were used to begin dilation of the uterus.  After the  uterus was dilated to a #21 Pratt dilator, the uterus was sounded at this time.  The sounding length measured 7 cm. The hysteroscope was then inserted into the uterine cavity under the continuous infusion of glycine solution.  The uterine perforation was confirmed at this time.  It was not possible with the diagnostic hysteroscopy to obtain entry into the cervical canal which was noted to be to the right of the uterine perforation.  There was no active bleeding noted.  The glycine deficit at this time was 500 mL.  The vaginal instruments were removed.  Cystoscopy was performed at this time to rule out a bladder perforation and the findings are as noted above.  The bladder and urethra appeared to be normal.  The anesthesia team at this time noted that the patient was becoming hypotensive and it was unclear if this was due to anesthetic or if it was due to potential intraperitoneal bleeding. Her hypotension was treated by with the anesthesia team.  The patient was otherwise noted to be stable at this time.  The anesthesiologist was present, and I asked if she was stable so that I could briefly inform the family of the uterine perforation and plan to proceed with a laparoscopy.  I was told yes.  I stepped out of the OR briefly while the team converted to laparoscopy.   The patient was re-prepped and redraped at this time.  A sponge on a ring forceps was placed inside the vagina.  An 1 cm umbilical incision was created with a scalpel.  A Veress needle was inserted into the peritoneal cavity and the saline drop test was performed.  The fluid flowed freely.  A pneumoperitoneum was then achieved with CO2 gas.  A Veress needle was removed and a 10-mm trocar was placed inside the peritoneal cavity without difficulty.  The laparoscope confirmed proper placement.  The patient was placed in Trendelenburg position at this time.  The glycine fluid was noted.  A 5 mm left lower quadrant incision  was created with a scalpel and a 5-mm trocar was placed under visualization of the laparoscope.  The glycine fluid was suctioned out with the Nezhat.  The uterine fundus was visualized and the perforation was noted.  There was no significant bleeding from this area although there was a very small hematoma noted in the serosa and myometrium immediately around the perforation site.  A complete inspection of the pelvic and abdominal organs was performed and the findings are as noted above.  An additional 5 mm right lower quadrant incision was created with a scalpel and a 5-mm trocar was placed under visualization of the laparoscope.  The bowel was held carefully out of the way of the uterine fundus and the serosa was then cauterized with Kleppinger forceps.  The patient was taken out of Trendelenburg and placed into reverse Trendelenburg in order to have access to the remaining glycine solution. This was suctioned from inside the peritoneal cavity.  At this point, the patient was placed back  in Trendelenburg.  The uterine perforation site was hemostatic, and the lower abdominal trocars were removed under visualization of the laparoscope.  There was hemostasis along the abdominal wall.  The laparoscope was then removed and the remaining pneumoperitoneum was released.  The patient received manual breaths.  The umbilical trocar was then removed.  The umbilical incision was closed along the fascia with a figure-of- eight suture of 0 Vicryl.  All of the skin incisions were closed with subcuticular suture of 4-0 Vicryl.  The incisions were then covered with Dermabond.  This concluded the patient's procedure.  All needle, instrument, and sponge counts were correct.  The patient is escorted to the recovery room in stable and awake condition.  Lenard Galloway, M.D.     BES/MEDQ  D:  02/25/2013  T:  02/26/2013  Job:  [id number]

## 2013-02-26 NOTE — Telephone Encounter (Signed)
Spoke with pt's sister who is with pt taking care of her. Sister reports pt saw Dr. Quincy Simmonds at the hospital and she wanted to see pt here in the office on Friday. Scheduled OV 02-28-13 at 11 am.

## 2013-02-27 LAB — TYPE AND SCREEN
ABO/RH(D): B POS
ANTIBODY SCREEN: NEGATIVE
UNIT DIVISION: 0
Unit division: 0
Unit division: 0
Unit division: 0

## 2013-02-27 NOTE — Telephone Encounter (Signed)
Routing to Dr. Quincy Simmonds,  Patient has follow up appointment on 02/28/13.  Okay to restart taking asa or discuss at appointment?

## 2013-02-27 NOTE — Telephone Encounter (Signed)
Pt notified that it was fine to restart aspirin. Verbalized understanding.

## 2013-02-27 NOTE — Telephone Encounter (Signed)
OK to restart aspirin.

## 2013-02-27 NOTE — Telephone Encounter (Signed)
Patient is asking if she can start taking Asprin again?

## 2013-02-27 NOTE — Discharge Summary (Signed)
Physician Discharge Summary  Patient ID: PAYSEN GOZA MRN: [id number] DOB/AGE: [birth year] 73 y.o.  Admit date: 02/25/2013 Discharge date: 02/26/13  Admission Diagnoses:  Endometrial thickening, suspected polyp  Discharge Diagnoses:  1. Endometrial thickening, suspected polyp 2. Status post attempted hysteroscopy with uterine perforation, cystoscopy, and laparoscopic fulguration of uterine serosal perforation site.   Active Problems:   Status post laparoscopy   Discharged Condition: good  Hospital Course:  The patient was admitted to the hospital on 02/25/13 for a hysteroscopy with anticipated polypectomy and dilation and curettage due to an endometrial mass noted on office ultrasound.  The patient presented with LLQ pain and the ultrasound documented a mass and suspected polyp.  Patient was intolerant of attempted endometrial biopsy in the office and plan made to proceed in the operating room.  In the operating room, cervix was noted to be nulliparous and appeared to dilate initially.  The hysteroscopy revealed suspected perforation.  The correct endometrial canal could not be navigated even under hysteroscopic direct visualization.  There was no active bleeding, but the anesthesia team reported hypotension that needed to be treated.  A decision was made to proceed with a diagnostic laparoscopy with possible coagulation of the uterine perforation site.  The patient was prepared preop for this possible complication of a perforation and the need for laparoscopy.  Cystoscopy was first performed which was normal.  Laparoscopy was also performed which confirmed the perforation.  There was a small hematoma on the surface of the uterus at the perforation site and very minimal bleeding.  The site was fulgurated.  The patient tolerated the procedure well.  She was admitted post op for monitoring overnight.  She did well and remained clinically stable during this time.  Her vital signs were stable  and she had excellent urine output.  She demonstrated little to no vaginal bleeding.   Her hemoglobin at discharge was noted to be 9.5, and this was attributed to hemodilution.  She had atelectasis on exam and required instruction in use and encouragement for ambulation.  She had no sign of hyponatremia. Her diet was advanced to normal.    The patient was educated in detail about the surgical complication and the need for the cystoscopy and laparoscopy, and the final plan for further care will be outlined in an office visit.   Consults: None  Significant Diagnostic Studies: labs and surgery as above.  Treatments: surgery:  Status post attempted hysteroscopy with uterine perforation, cystoscopy, and laparoscopic fulguration of uterine serosal perforation site.   Discharge Exam: Blood pressure 127/50, pulse 79, temperature 98.1 F (36.7 C), temperature source Oral, resp. rate 18, height 5' (1.524 m), weight 176 lb (79.833 kg), last menstrual period 02/07/1992, SpO2 96.00%. General appearance: alert Resp:  Rales in bases.  Poor use of incentive spirometer. Cardio:  S1S2 RRR. GI: soft, non-tender; bowel sounds normal; no masses,  no organomegaly Incision/Wound:  Incisions clean, dry, intact.   Disposition: 01-Home or Self Care Instructions and precautions reviewed with the patient.  Will come to the office on 02/28/13 for a recheck and Hgb, sooner as needed.   Discharge Orders   Future Appointments Provider Department Dept Phone   02/28/2013 11:00 AM [place], MD [place] [phone]   03/07/2013 2:15 PM Renato Shin, MD [facility] [phone]   03/10/2013 11:00 AM [place], MD [place] [phone]   04/11/2013 1:30 PM [facility] - [id number] [place] [phone]   Please arrive 15 minutes prior to your appointment time. Any medications can be taken  as usual.   04/11/2013 2:00 PM [facility] [phone]   Please wear two piece clothing and wear no powder or deodorant. Please arrive 15 minutes early prior to your appointment time.   07/16/2013 2:30 PM Penni Bombard, MD [facility] [phone]   Future Orders Complete By Expires   Call MD for:  difficulty breathing, headache or visual disturbances  As directed    Call MD for:  extreme fatigue  As directed    Call MD for:  hives  As directed    Call MD for:  persistant dizziness or light-headedness  As directed    Call MD for:  persistant nausea and vomiting  As directed    Call MD for:  redness, tenderness, or signs of infection (pain, swelling, redness, odor or green/yellow discharge around incision site)  As directed    Call MD for:  severe uncontrolled pain  As directed    Call MD for:  temperature >100.4  As directed    Diet - low sodium heart healthy  As directed    Diet Carb Modified  As directed    Discharge wound care:  As directed    Comments:     Use antibacterial soap and water.   Increase activity slowly  As directed    No dressing needed  As directed        Medication List         acetaminophen 500 MG tablet  Commonly known as:  TYLENOL  Take 2 tablets (1,000 mg total) by mouth every 6 (six) hours as needed for moderate pain.     amLODipine 5 MG tablet  Commonly known as:  NORVASC  Take one tablet by mouth daily.     cholecalciferol 1000 UNITS tablet  Commonly known as:  VITAMIN D  Take 1,000 Units by mouth daily.     clopidogrel 75 MG tablet  Commonly known as:  PLAVIX  Take 1 tablet (75 mg total) by mouth daily. Begin in one week.     dicyclomine 10 MG capsule  Commonly known as:  BENTYL  Take 1 capsule (10 mg total) by mouth every 6 (six) hours as needed (abdominal pain- may take 2 capsules if 1 ineffective).     esomeprazole 40 MG capsule  Commonly known as:  NEXIUM  Take 1 capsule (40 mg  total) by mouth daily before breakfast.     folic acid-pyridoxine-cyancobalamin 2.5-25-2 MG Tabs  Commonly known as:  FOLBIC  Take 1 tablet by mouth daily.     Iron 28 MG Tabs  Take 1 tablet by mouth daily as needed (doesn't take everyday due to constipation).     menthol-cetylpyridinium 3 MG lozenge  Commonly known as:  CEPACOL  Take 1 lozenge (3 mg total) by mouth every 2 (two) hours as needed for sore throat.     metFORMIN 500 MG 24 hr tablet  Commonly known as:  GLUCOPHAGE-XR  Take 2 tablets (1,000 mg total) by mouth 2 (two) times daily.     metoCLOPramide 5 MG tablet  Commonly known as:  REGLAN  Take 5 mg by mouth 4 (four) times daily. Take one tablet by mouth before each meal and every night at bedtrime     metoprolol tartrate 25 MG tablet  Commonly known as:  LOPRESSOR  Take  25 mg by mouth 2 (two) times daily.     multivitamin tablet  Take 1 tablet by mouth daily.     ondansetron 4 MG tablet  Commonly known as:  ZOFRAN  - Take 4 mg by mouth every 4 (four) hours as needed. For nausea  -      potassium chloride 10 MEQ tablet  Commonly known as:  K-DUR,KLOR-CON  Take 1 tablet (10 mEq total) by mouth 2 (two) times daily.     rosuvastatin 10 MG tablet  Commonly known as:  CRESTOR  Take 1 tablet (10 mg total) by mouth daily.     SYSTANE BALANCE OP  Apply 1 drop to eye 2 (two) times daily.     valsartan 320 MG tablet  Commonly known as:  DIOVAN  Take 1 tablet by mouth  daily         Signed: Tacy Learn 02/27/2013, 8:02 AM

## 2013-02-28 ENCOUNTER — Encounter: Payer: Self-pay | Admitting: Obstetrics and Gynecology

## 2013-02-28 ENCOUNTER — Ambulatory Visit (INDEPENDENT_AMBULATORY_CARE_PROVIDER_SITE_OTHER): Payer: Medicare Other | Admitting: Obstetrics and Gynecology

## 2013-02-28 VITALS — BP 147/82 | HR 64 | Resp 14 | Wt 179.0 lb

## 2013-02-28 DIAGNOSIS — D649 Anemia, unspecified: Secondary | ICD-10-CM

## 2013-02-28 DIAGNOSIS — N9489 Other specified conditions associated with female genital organs and menstrual cycle: Secondary | ICD-10-CM

## 2013-02-28 LAB — HEMOGLOBIN, FINGERSTICK: Hemoglobin, fingerstick: 10.1 g/dL — ABNORMAL LOW (ref 12.0–16.0)

## 2013-02-28 MED ORDER — CIPROFLOXACIN HCL 500 MG PO TABS: 500.0000 mg | ORAL_TABLET | Freq: Two times a day (BID) | ORAL | Status: DC

## 2013-02-28 NOTE — Progress Notes (Signed)
[name], female   DOB: Sep 01, 1940, 73 y.o.   MRN: [id number]  Subjective  Patient is here for a recheck following surgery. Went to OR for hysteroscopy with anticipated polypectomy, dilation and curettage for an endometrial mass noted on ultrasound ordered for LLQ pa Had an attempted hysteroscopy with uterine perforation, cystoscopy, and diagnostic laparoscopy with fulguration of uterine serosa at [place] on 02/25/13. Patient stayed overnight for observation.  Received prophylactic antibiotics - Cefotetan 2 gm IV q 12 hours x 2 doses.  Discharge hemoglobin was 9.5.  Patient feeling disappointed about the surgery and the inability to make a diagnosis and removal of the endometrial mass. Some abdominal incisional pain. Taking tylenol.  Does not need anything stronger. Denies fever. No vaginal bleeding.  Notes bruising.  Takes ASA 81 mg daily.   Wants to avoid further procedures or surgeries.   Objective  T 98.8  Gen - NAD. Abdomen - ecchymoses at incision sites and along lower abdomen when LAVH drape had contact with skin.  10 x 7 cm patch of erythema starting 3 cm below the umbilical incision, warm to the touch.  Nontender. Pelvic exam - Normal external genitalia and urethra. Pediatric speculum used and mild amount of old blood noted in vault. No lesions of the cervix.  Tenaculum site noted. Uterus small and nontender. No adnexal masses or tenderness.   Hgb - 10.1 today  Assessment  Stable post op. Hgb improved. Reaction to adhesive of surgery drape. Bruising.  On ASA. Possible early cellulitis of abdomen.  Plan  Start ciprofloxacin 500 mg po bid for 7 days. See Epic.  (Patient declines Keflex.  States it causes GI problems.) I recommend patient stop the ASA for the next 2  - 3 days to allow her platelets to function while she in in the early post op phase.  Follow up in about 10 days. Call for fever, increasing redness of the  skin, or any other concern.  I will refer patient to Dr. Fermin Schwab and his team due to my inability to provide the patient with a pathologic diagnosis and her desire to avoid hysterectomy or further surgery at this time.   After visit summary to patient.

## 2013-02-28 NOTE — Patient Instructions (Signed)
Please take the ciprofloxacin twice a day for 7 days.

## 2013-03-03 ENCOUNTER — Telehealth: Payer: Self-pay | Admitting: Obstetrics and Gynecology

## 2013-03-03 NOTE — Telephone Encounter (Signed)
Advised patient of appointment w/ Dr Alycia Rossetti 02.04.2015 @ 0945//ssf

## 2013-03-07 ENCOUNTER — Ambulatory Visit: Payer: Medicare Other | Admitting: Endocrinology

## 2013-03-10 ENCOUNTER — Encounter: Payer: Self-pay | Admitting: Obstetrics and Gynecology

## 2013-03-10 ENCOUNTER — Telehealth: Payer: Self-pay | Admitting: Cardiology

## 2013-03-10 ENCOUNTER — Ambulatory Visit (INDEPENDENT_AMBULATORY_CARE_PROVIDER_SITE_OTHER): Payer: Medicare Other | Admitting: Obstetrics and Gynecology

## 2013-03-10 VITALS — BP 139/74 | HR 73 | Resp 16 | Wt 177.0 lb

## 2013-03-10 DIAGNOSIS — Z9889 Other specified postprocedural states: Secondary | ICD-10-CM

## 2013-03-10 NOTE — Telephone Encounter (Deleted)
Error

## 2013-03-10 NOTE — Patient Instructions (Signed)
Please keep your appointment with Dr Alycia Rossetti for March 12, 2013 at the [place] attached to [facility].

## 2013-03-10 NOTE — Progress Notes (Signed)
[name], female   DOB: Jul 02, 1940, 73 y.o.   MRN: [id number]  Subjective  Patient is here for a recheck following an attempted hysteroscopy with dilation and curettage, abandoned due to uterine perforation and inability to negotiate a nulliparous and stenotic endometrial canal. Patient had a laparoscopy with coagulation of the uterine serosa.  Patient did not tolerate a speculum examination in the office on the day of ultrasound that showed an endometrial mass, which is what lead to the recommendation for the hysteroscopy surgery.   Patient is disappointed with inability to have tissue diagnosis and remove endometrial mass.  Wants to avoid future surgery.  Took Ciprofloxacin antibiotics for post op cellulitis. Declined Keflex.  She has resumed her daily aspirin.   Objective  Abdomen - incisions intact.  No erythema of skin.  Minimal ecchymoses.  Assessment  Status post uterine perforation during dilation for attempted hysteroscopic procedure, resulting in laparoscopy with coagulation of uterine serosa.   Inability to obtain tissue diagnosis for endometrial mass.   Nulliparous and stenotic cervix. 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 Cellulitis resolved.  Plan  Patient has a consultation with Dr. Alycia Rossetti of [facility] on 03/12/13 regarding inability to sample mass and patient's declining future surgical care.  I have again reviewed the patient's clinical course which lead to surgery.  I have reviewed the surgical complication and the need for laparoscopy.  I have reviewed that I was unable to provide a diagnosis for the endometrial thickening/mass.     Patient understands the importance of keeping her appointment with Dr. Alycia Rossetti.  She will proceed with this.

## 2013-03-11 ENCOUNTER — Telehealth: Payer: Self-pay | Admitting: *Deleted

## 2013-03-11 NOTE — Telephone Encounter (Signed)
Patient called for a refill of amlodipine 2.5mg  to take along with the 5mg . It looks like her dose was decreased to 5mg  daily last year. She wanted me to check with you to be sure that this was correct as she doesn't remember being told this and she has been taking 7.5mg  daily continuously. Please advise on the correct dose for the patient. Thanks, MI

## 2013-03-12 ENCOUNTER — Encounter: Payer: Self-pay | Admitting: Gynecologic Oncology

## 2013-03-12 ENCOUNTER — Ambulatory Visit: Payer: Medicare Other | Attending: Gynecologic Oncology | Admitting: Gynecologic Oncology

## 2013-03-12 VITALS — BP 152/59 | HR 70 | Temp 98.7°F | Resp 16 | Ht 60.39 in | Wt 174.0 lb

## 2013-03-12 DIAGNOSIS — Z8673 Personal history of transient ischemic attack (TIA), and cerebral infarction without residual deficits: Secondary | ICD-10-CM | POA: Insufficient documentation

## 2013-03-12 DIAGNOSIS — Z79899 Other long term (current) drug therapy: Secondary | ICD-10-CM | POA: Insufficient documentation

## 2013-03-12 DIAGNOSIS — N952 Postmenopausal atrophic vaginitis: Secondary | ICD-10-CM | POA: Insufficient documentation

## 2013-03-12 DIAGNOSIS — Z7902 Long term (current) use of antithrombotics/antiplatelets: Secondary | ICD-10-CM | POA: Insufficient documentation

## 2013-03-12 DIAGNOSIS — D251 Intramural leiomyoma of uterus: Secondary | ICD-10-CM | POA: Diagnosis not present

## 2013-03-12 DIAGNOSIS — R1900 Intra-abdominal and pelvic swelling, mass and lump, unspecified site: Secondary | ICD-10-CM | POA: Diagnosis not present

## 2013-03-12 DIAGNOSIS — I10 Essential (primary) hypertension: Secondary | ICD-10-CM | POA: Diagnosis not present

## 2013-03-12 DIAGNOSIS — K219 Gastro-esophageal reflux disease without esophagitis: Secondary | ICD-10-CM | POA: Diagnosis not present

## 2013-03-12 DIAGNOSIS — E7800 Pure hypercholesterolemia, unspecified: Secondary | ICD-10-CM | POA: Diagnosis not present

## 2013-03-12 DIAGNOSIS — N9489 Other specified conditions associated with female genital organs and menstrual cycle: Secondary | ICD-10-CM | POA: Insufficient documentation

## 2013-03-12 NOTE — Patient Instructions (Signed)
We would recommend a repeat ultrasound with Dr. Quincy Simmonds in 6 months. We will be happy to review is at that time if indicated. Please contact us or Dr. Quincy Simmonds if you have any changes in your status including pain, bleeding, vaginal discharge.

## 2013-03-12 NOTE — Telephone Encounter (Signed)
She should be on 5mg daily 

## 2013-03-12 NOTE — Progress Notes (Signed)
Consult Note: Gyn-Onc  [name] 73 y.o. female  CC:  Chief Complaint  Patient presents with  . Endometrial Mass    New Consult     HPI: Patient is seen today in consultation at the request of Dr. Quincy Simmonds for an endometrial mass.  Patient is a 73 year old gravida 0 who's been menopausal since about the age of 71. She was on hormone replacement therapy for about 3 years. She never experienced any bleeding or discharge. She went to Dr. Quincy Simmonds complaining of some heaviness in the pelvis. She also has a history of diverticulosis and irritable bowel syndrome with pain. On ultrasound was performed 12/26/2012. Should a thickening of the endometrium at the fundus measuring 8 x 5 mm. There is a small intramural fibroid measured at 1 cm. The ovaries were unremarkable. An office endometrial biopsy was attempted but was failed second the patient tolerance. After discussion in consultation she was consented for a hysteroscopy D&C. The patient underwent an examination anesthesia, attempted hysteroscopy with perforation, cystoscopy and laparoscopy with fulguration of the uterine fundal serosa on February 25, 2013.  Dr. Quincy Simmonds was not able to enter the endometrial cavity to evaluate with a polyp. The procedure was aborted at the time of the perforation. She is referred to [place] regarding management recommendations for the small endometrial mass.   The patient states that the symptoms she had been to see Dr. Quincy Simmonds for are not gone. They are intermittent and she feels that they're related to her bowels. She has been using estrogen in the vagina and she states that it "does not feel good". She states that she had ultrasound approximately 2 years ago that showed a fibroid, but did not show the small endometrial mass and she was concerned that the vaginal estrogen she was using caused the mass. She's no used longer using the vaginal estrogen as she had a "mini stroke".  Review of Systems  Constitutional:  Denies  fever. Skin: No rash, sores, jaundice, itching, or dryness.  Cardiovascular: No chest pain, shortness of breath, or edema  Pulmonary: No cough or wheeze.  Gastro Intestinal: Reporting intermittent lower abdominal soreness.  +nausea she states is due to her acid reflux and her diabetes. She takes Reglan for this when necessary which helps. She denies vomiting, constipation, or diarrhea at this time. No bright red blood per rectum or change in bowel movement.  Genitourinary: No frequency, urgency, or dysuria.  Denies vaginal bleeding and discharge.  Psychology: No changes  Current Meds:  Outpatient Encounter Prescriptions as of 03/12/2013  Medication Sig  . acetaminophen (TYLENOL) 500 MG tablet Take 2 tablets (1,000 mg total) by mouth every 6 (six) hours as needed for moderate pain.  Marland Kitchen amLODipine (NORVASC) 5 MG tablet Take one tablet by mouth daily.  . cholecalciferol (VITAMIN D) 1000 UNITS tablet Take 1,000 Units by mouth daily.    . clopidogrel (PLAVIX) 75 MG tablet Take 1 tablet (75 mg total) by mouth daily. Begin in one week.  . dicyclomine (BENTYL) 10 MG capsule Take 1 capsule (10 mg total) by mouth every 6 (six) hours as needed (abdominal pain- may take 2 capsules if 1 ineffective).  Marland Kitchen esomeprazole (NEXIUM) 40 MG capsule Take 1 capsule (40 mg total) by mouth daily before breakfast.  . Ferrous Sulfate (IRON) 28 MG TABS Take 1 tablet by mouth daily as needed (doesn't take everyday due to constipation).   . folic acid-pyridoxine-cyancobalamin (FOLBIC) 2.5-25-2 MG TABS Take 1 tablet by mouth daily.  Marland Kitchen menthol-cetylpyridinium (  CEPACOL) 3 MG lozenge Take 1 lozenge (3 mg total) by mouth every 2 (two) hours as needed for sore throat.  . metFORMIN (GLUCOPHAGE-XR) 500 MG 24 hr tablet Take 2 tablets (1,000 mg total) by mouth 2 (two) times daily.  . metoCLOPramide (REGLAN) 5 MG tablet Take 5 mg by mouth 4 (four) times daily. Take one tablet by mouth before each meal and every night at bedtrime  .  metoprolol tartrate (LOPRESSOR) 25 MG tablet Take 25 mg by mouth 2 (two) times daily.  . ondansetron (ZOFRAN) 4 MG tablet Take 4 mg by mouth every 4 (four) hours as needed. For nausea    . pantoprazole (PROTONIX) 40 MG tablet   . potassium chloride (K-DUR,KLOR-CON) 10 MEQ tablet Take 1 tablet (10 mEq total) by mouth 2 (two) times daily.  Marland Kitchen Propylene Glycol (SYSTANE BALANCE OP) Apply 1 drop to eye 2 (two) times daily.  . rosuvastatin (CRESTOR) 10 MG tablet Take 1 tablet (10 mg total) by mouth daily.  . valsartan (DIOVAN) 320 MG tablet Take 1 tablet by mouth  daily  . AZASITE 1 % ophthalmic solution   . Multiple Vitamin (MULTIVITAMIN) tablet Take 1 tablet by mouth daily.      Allergy:  Allergies  Allergen Reactions  . Tramadol     Dizziness     Social Hx:   History   Social History  . Marital Status: Married    Spouse Name: N/A    Number of Children: 0  . Years of Education: college   Occupational History  . Retail    Social History Main Topics  . Smoking status: Never Smoker   . Smokeless tobacco: Never Used     Comment: Daily Caffeine Use -1  . Alcohol Use: No  . Drug Use: No  . Sexual Activity: No   Other Topics Concern  . Not on file   Social History Narrative   Patient is married ([name]) and lives with her husband.   Patient does not have any children.   Patient is right-handed.   Patient has a BA degree.   One caffeine drink daily     Past Surgical Hx:  Past Surgical History  Procedure Laterality Date  . Cholecystectomy  1993  . Tonsillectomy    . Colonoscopy  11/11/2010    diverticulosis  . Esophagogastroduodenoscopy  08/29/2010; 09/15/2010    Carcinoid tumor less than 1 cm in July 2012 not seen in August 2012 , gastritis, fundic gland polyps  . Eus  12/15/2010    Procedure: UPPER ENDOSCOPIC ULTRASOUND (EUS) LINEAR;  Surgeon: Owens Loffler, MD;  Location: [facility];  Service: Endoscopy;  Laterality: N/A;  . Esophagogastroduodenoscopy  05/16/2011  .  Esophagogastroduodenoscopy  06/14/2012  . Dilatation & currettage/hysteroscopy with resectocope N/A 02/25/2013    Procedure: Attempted hysteroscopy with uterine perforation;  Surgeon: Jamey Reas de Berton Lan, MD;  Location: [facility];  Service: Gynecology;  Laterality: N/A;  . Laparoscopy N/A 02/25/2013    Procedure: Cystoscopy and laparoscopy with fulguration of uterine serosa;  Surgeon: Jamey Reas de Berton Lan, MD;  Location: [facility];  Service: Gynecology;  Laterality: N/A;    Past Medical Hx:  Past Medical History  Diagnosis Date  . Gastroparesis   . Iron deficiency anemia, unspecified   . Pure hypercholesterolemia   . Headache(784.0)   . Unspecified essential hypertension   . Esophageal reflux   . Type II or unspecified type diabetes mellitus without mention of complication, not stated  as uncontrolled   . Arthritis     Spinal Osteoarthritis  . Diverticulosis 08/30/2000    Colonoscopy   . Gastric polyp     Fundic Gland  . Anxiety   . Cataract   . Heart murmur     Echocardiogram 2/11: EF 60-65%, mild LAE, grade 1 diastolic dysfunction, aortic valve sclerosis, mean gradient 9 mm of mercury, PASP 34  . Chest pain     Myoview 1/11: EF 82%, no ischemia.  . Carcinoid tumor of stomach   . PSVT (paroxysmal supraventricular tachycardia)   . Stroke     tia, 2014  . Leg swelling     bilateral  . Chronic kidney disease     Left kidney smaller than right kidney  . Cancer   . PONV (postoperative nausea and vomiting)     pt states only needs small amount of anesthesia    Oncology Hx:   No history exists.    Family Hx:  Family History  Problem Relation Age of Onset  . Diabetes Mother   . Stroke Father     deceased age 105  . Heart disease Sister     deceased MI age 26  . Heart disease Brother     deceased MI age 30  . Colon cancer Neg Hx   . Esophageal cancer Neg Hx   . Stomach cancer Neg Hx   . Rectal cancer Neg Hx     Vitals:  Blood pressure 152/59,  pulse 70, temperature 98.7 F (37.1 C), temperature source Oral, resp. rate 16, height 5' 0.39" (1.534 m), weight 174 lb (78.926 kg), last menstrual period 02/07/1992.  Physical Exam:  Well-nourished well-developed female in no acute distress.  Neck: Supple, no lymphadenopathy, no thyromegaly.  Lungs: Clear to auscultation bilaterally.  Cardiovascular: Regular rate rhythm.  Abdomen: Soft, nontender, nondistended. There are no palpable masses or hepatosplenomegaly. There are well-healed laparoscopic incisions with some healing ecchymoses.  Groins: No lymphadenopathy.  Extremities: 1+ nonpitting edema equal bilaterally.  Pelvic: Normal external female genitalia. Vagina is atrophic the cervix is visualized. There's no visible lesions. Bimanual exam is limited by patient's voluntary guarding but the uterus appears to be of normal size shape and consistency.  Assessment/Plan: 73 year old with a by 5 mm endometrial mass that may be an endometrial polyp. The patient is asymptomatic.An attempt was made for removal with hysteroscopy D&C but this was not successful secondary to a uterine perforation. In the absence of any family history for gynecologic cancer and no symptoms I believe that this can be followed conservatively. The patient states that she is relieved as she was not going to undergo any other procedures at this time. I would recommend repeat ultrasound in 6 months. Precautions were given to the patient if she experiences any vaginal bleeding, discharge or increased pain, she is to notify us or Dr. Quincy Simmonds as soon as possible. She knows that we will be happy to review the ultrasound should Dr. Quincy Simmonds wish [place] to do so.  Karrington Studnicka A., MD 03/12/2013, 10:37 AM

## 2013-03-13 ENCOUNTER — Telehealth: Payer: Self-pay | Admitting: Obstetrics and Gynecology

## 2013-03-13 DIAGNOSIS — N9489 Other specified conditions associated with female genital organs and menstrual cycle: Secondary | ICD-10-CM

## 2013-03-13 NOTE — Telephone Encounter (Signed)
Phone call to patient in follow up to [place] consultation.  Subjective  Some lower abdomen discomfort. None yesterday. No dysuria. Last bowel movement.   Objective  Consult letter from Dr. Alycia Rossetti recommending patient have an ultrasound in 6 months for re-evaluation of endometrial mass.  Assessment  Lower abdominal discomfort after pelvic exam yesterday.  Plan  Try Tylenol for pain and return if increases. Pelvic ultrasound in 6 months in the office here.  Our office will call her to schedule.  Call for postmenopausal bleeding.  Patient satisfied with the plan and with her [place] consultation.

## 2013-03-13 NOTE — Telephone Encounter (Signed)
Patient aware.

## 2013-03-13 NOTE — Telephone Encounter (Signed)
Telephoned patient/ advised of $0 patient liability for 6 month f/u PUS/ scheduled PUS/ advised patient of cancellation policy and cancellation fee//ssf

## 2013-03-18 ENCOUNTER — Ambulatory Visit (INDEPENDENT_AMBULATORY_CARE_PROVIDER_SITE_OTHER): Payer: Medicare Other | Admitting: Endocrinology

## 2013-03-18 ENCOUNTER — Encounter: Payer: Self-pay | Admitting: Endocrinology

## 2013-03-18 VITALS — BP 118/60 | HR 69 | Temp 98.2°F | Ht 60.0 in | Wt 173.0 lb

## 2013-03-18 DIAGNOSIS — Z1239 Encounter for other screening for malignant neoplasm of breast: Secondary | ICD-10-CM

## 2013-03-18 DIAGNOSIS — E119 Type 2 diabetes mellitus without complications: Secondary | ICD-10-CM

## 2013-03-18 DIAGNOSIS — I10 Essential (primary) hypertension: Secondary | ICD-10-CM

## 2013-03-18 DIAGNOSIS — E7800 Pure hypercholesterolemia, unspecified: Secondary | ICD-10-CM | POA: Diagnosis not present

## 2013-03-18 DIAGNOSIS — E559 Vitamin D deficiency, unspecified: Secondary | ICD-10-CM | POA: Diagnosis not present

## 2013-03-18 DIAGNOSIS — M8580 Other specified disorders of bone density and structure, unspecified site: Secondary | ICD-10-CM

## 2013-03-18 DIAGNOSIS — M949 Disorder of cartilage, unspecified: Secondary | ICD-10-CM

## 2013-03-18 DIAGNOSIS — Z79899 Other long term (current) drug therapy: Secondary | ICD-10-CM

## 2013-03-18 DIAGNOSIS — R42 Dizziness and giddiness: Secondary | ICD-10-CM

## 2013-03-18 DIAGNOSIS — M899 Disorder of bone, unspecified: Secondary | ICD-10-CM

## 2013-03-18 LAB — CBC WITH DIFFERENTIAL/PLATELET
BASOS ABS: 0 10*3/uL (ref 0.0–0.1)
Basophils Relative: 0 % (ref 0–1)
EOS ABS: 0.1 10*3/uL (ref 0.0–0.7)
Eosinophils Relative: 1 % (ref 0–5)
HCT: 34 % — ABNORMAL LOW (ref 36.0–46.0)
Hemoglobin: 11.1 g/dL — ABNORMAL LOW (ref 12.0–15.0)
Lymphocytes Relative: 42 % (ref 12–46)
Lymphs Abs: 3 10*3/uL (ref 0.7–4.0)
MCH: 25.2 pg — AB (ref 26.0–34.0)
MCHC: 32.6 g/dL (ref 30.0–36.0)
MCV: 77.1 fL — ABNORMAL LOW (ref 78.0–100.0)
Monocytes Absolute: 0.7 10*3/uL (ref 0.1–1.0)
Monocytes Relative: 9 % (ref 3–12)
Neutro Abs: 3.4 10*3/uL (ref 1.7–7.7)
Neutrophils Relative %: 48 % (ref 43–77)
Platelets: 250 10*3/uL (ref 150–400)
RBC: 4.41 MIL/uL (ref 3.87–5.11)
RDW: 14.5 % (ref 11.5–15.5)
WBC: 7.2 10*3/uL (ref 4.0–10.5)

## 2013-03-18 LAB — BASIC METABOLIC PANEL
BUN: 17 mg/dL (ref 6–23)
CO2: 27 mEq/L (ref 19–32)
Calcium: 10.2 mg/dL (ref 8.4–10.5)
Chloride: 102 mEq/L (ref 96–112)
Creat: 0.67 mg/dL (ref 0.50–1.10)
GLUCOSE: 83 mg/dL (ref 70–99)
POTASSIUM: 4.5 meq/L (ref 3.5–5.3)
Sodium: 138 mEq/L (ref 135–145)

## 2013-03-18 LAB — LIPID PANEL
CHOL/HDL RATIO: 2.6 ratio
Cholesterol: 184 mg/dL (ref 0–200)
HDL: 72 mg/dL (ref >39)
LDL Cholesterol: 90 mg/dL (ref 0–99)
Triglycerides: 111 mg/dL (ref <150)
VLDL: 22 mg/dL (ref 0–40)

## 2013-03-18 LAB — HEPATIC FUNCTION PANEL
ALT: 22 U/L (ref 0–35)
AST: 24 U/L (ref 0–37)
Albumin: 4.3 g/dL (ref 3.5–5.2)
Alkaline Phosphatase: 68 U/L (ref 39–117)
BILIRUBIN INDIRECT: 0.4 mg/dL (ref 0.2–1.2)
Bilirubin, Direct: 0.1 mg/dL (ref 0.0–0.3)
Total Bilirubin: 0.5 mg/dL (ref 0.2–1.2)
Total Protein: 7.2 g/dL (ref 6.0–8.3)

## 2013-03-18 LAB — HEMOGLOBIN A1C
HEMOGLOBIN A1C: 6.7 % — AB (ref <5.7)
Mean Plasma Glucose: 146 mg/dL — ABNORMAL HIGH (ref <117)

## 2013-03-18 NOTE — Progress Notes (Signed)
Subjective:    [name], female    DOB: 11-27-1940, 73 y.o.   MRN: [id number]  HPI Pt returns for f/u of type 2 DM (dx'ed 2004; she has mild if any neuropathy of the lower extremities, but she has assoc TIA; she has never been on insulin; she takes metformin only).  pt states she feels well in general.  Denies chest pain and sob. Past Medical History  Diagnosis Date  . Gastroparesis   . Iron deficiency anemia, unspecified   . Pure hypercholesterolemia   . Headache(784.0)   . Unspecified essential hypertension   . Esophageal reflux   . Type II or unspecified type diabetes mellitus without mention of complication, not stated as uncontrolled   . Arthritis     Spinal Osteoarthritis  . Diverticulosis 08/30/2000    Colonoscopy   . Gastric polyp     Fundic Gland  . Anxiety   . Cataract   . Heart murmur     Echocardiogram 2/11: EF 60-65%, mild LAE, grade 1 diastolic dysfunction, aortic valve sclerosis, mean gradient 9 mm of mercury, PASP 34  . Chest pain     Myoview 1/11: EF 82%, no ischemia.  . Carcinoid tumor of stomach   . PSVT (paroxysmal supraventricular tachycardia)   . Stroke     tia, 2014  . Leg swelling     bilateral  . Chronic kidney disease     Left kidney smaller than right kidney  . Cancer   . PONV (postoperative nausea and vomiting)     pt states only needs small amount of anesthesia    Past Surgical History  Procedure Laterality Date  . Cholecystectomy  1993  . Tonsillectomy    . Colonoscopy  11/11/2010    diverticulosis  . Esophagogastroduodenoscopy  08/29/2010; 09/15/2010    Carcinoid tumor less than 1 cm in July 2012 not seen in August 2012 , gastritis, fundic gland polyps  . Eus  12/15/2010    Procedure: UPPER ENDOSCOPIC ULTRASOUND (EUS) LINEAR;  Surgeon: Owens Loffler, MD;  Location: [facility];  Service: Endoscopy;  Laterality: N/A;  . Esophagogastroduodenoscopy  05/16/2011  . Esophagogastroduodenoscopy  06/14/2012  . Dilatation &  currettage/hysteroscopy with resectocope N/A 02/25/2013    Procedure: Attempted hysteroscopy with uterine perforation;  Surgeon: Jamey Reas de Berton Lan, MD;  Location: [facility];  Service: Gynecology;  Laterality: N/A;  . Laparoscopy N/A 02/25/2013    Procedure: Cystoscopy and laparoscopy with fulguration of uterine serosa;  Surgeon: Jamey Reas de Berton Lan, MD;  Location: [facility];  Service: Gynecology;  Laterality: N/A;    History   Social History  . Marital Status: Married    Spouse Name: N/A    Number of Children: 0  . Years of Education: college   Occupational History  . Retail    Social History Main Topics  . Smoking status: Never Smoker   . Smokeless tobacco: Never Used     Comment: Daily Caffeine Use -1  . Alcohol Use: No  . Drug Use: No  . Sexual Activity: No   Other Topics Concern  . Not on file   Social History Narrative   Patient is married ([name]) and lives with her husband.   Patient does not have any children.   Patient is right-handed.   Patient has a BA degree.   One caffeine drink daily     Current Outpatient Prescriptions on File Prior to Visit  Medication Sig Dispense  Refill  . acetaminophen (TYLENOL) 500 MG tablet Take 2 tablets (1,000 mg total) by mouth every 6 (six) hours as needed for moderate pain.  30 tablet  0  . amLODipine (NORVASC) 5 MG tablet Take one tablet by mouth daily.  90 tablet  2  . AZASITE 1 % ophthalmic solution       . cholecalciferol (VITAMIN D) 1000 UNITS tablet Take 1,000 Units by mouth daily.        . clopidogrel (PLAVIX) 75 MG tablet Take 1 tablet (75 mg total) by mouth daily. Begin in one week.  90 tablet  4  . dicyclomine (BENTYL) 10 MG capsule Take 1 capsule (10 mg total) by mouth every 6 (six) hours as needed (abdominal pain- may take 2 capsules if 1 ineffective).  60 capsule  0  . esomeprazole (NEXIUM) 40 MG capsule Take 1 capsule (40 mg total) by mouth daily before breakfast.  90 capsule  3  . Ferrous  Sulfate (IRON) 28 MG TABS Take 1 tablet by mouth daily as needed (doesn't take everyday due to constipation).       . folic acid-pyridoxine-cyancobalamin (FOLBIC) 2.5-25-2 MG TABS Take 1 tablet by mouth daily.  90 tablet  3  . menthol-cetylpyridinium (CEPACOL) 3 MG lozenge Take 1 lozenge (3 mg total) by mouth every 2 (two) hours as needed for sore throat.  100 tablet  12  . metFORMIN (GLUCOPHAGE-XR) 500 MG 24 hr tablet Take 2 tablets (1,000 mg total) by mouth 2 (two) times daily.  180 tablet  1  . metoCLOPramide (REGLAN) 5 MG tablet Take 5 mg by mouth 4 (four) times daily. Take one tablet by mouth before each meal and every night at bedtrime      . metoprolol tartrate (LOPRESSOR) 25 MG tablet Take 25 mg by mouth 2 (two) times daily.      . Multiple Vitamin (MULTIVITAMIN) tablet Take 1 tablet by mouth daily.        . ondansetron (ZOFRAN) 4 MG tablet Take 4 mg by mouth every 4 (four) hours as needed. For nausea        . pantoprazole (PROTONIX) 40 MG tablet       . potassium chloride (K-DUR,KLOR-CON) 10 MEQ tablet Take 1 tablet (10 mEq total) by mouth 2 (two) times daily.  180 tablet  3  . Propylene Glycol (SYSTANE BALANCE OP) Apply 1 drop to eye 2 (two) times daily.      . rosuvastatin (CRESTOR) 10 MG tablet Take 1 tablet (10 mg total) by mouth daily.  30 tablet  11  . valsartan (DIOVAN) 320 MG tablet Take 1 tablet by mouth  daily  90 tablet  3   No current facility-administered medications on file prior to visit.    Allergies  Allergen Reactions  . Tramadol     Dizziness     Family History  Problem Relation Age of Onset  . Diabetes Mother   . Stroke Father     deceased age 61  . Heart disease Sister     deceased MI age 64  . Heart disease Brother     deceased MI age 34  . Colon cancer Neg Hx   . Esophageal cancer Neg Hx   . Stomach cancer Neg Hx   . Rectal cancer Neg Hx    BP 118/60  Pulse 69  Temp(Src) 98.2 F (36.8 C) (Oral)  Ht 5' (1.524 m)  Wt 173 lb (78.472 kg)  BMI  33.79 kg/m2  SpO2  98%  LMP 02/07/1992  Review of Systems Denies weight change and n/v    Objective:   Physical Exam VITAL SIGNS:  See vs page GENERAL: no distress  Lab Results  Component Value Date   WBC 7.2 03/18/2013   HGB 11.1* 03/18/2013   HCT 34.0* 03/18/2013   MCV 77.1* 03/18/2013   PLT 250 03/18/2013   Lab Results  Component Value Date   HGBA1C 6.7* 03/18/2013      Assessment & Plan:  Postoperative anemia: improved DM: well-controlled Dyslipidemia: well-controlled

## 2013-03-18 NOTE — Patient Instructions (Signed)
blood tests are being requested for you today.  We'll contact you with results. Please come back for a "medicare wellness" appointment in 3 months. check your blood sugar once a day.  vary the time of day when you check, between before the 3 meals, and at bedtime.  also check if you have symptoms of your blood sugar being too high or too low.  please keep a record of the readings and bring it to your next appointment here.  You can write it on any piece of paper.  please call us sooner if your blood sugar goes below 70, or if you have a lot of readings over 200.

## 2013-03-19 LAB — URINALYSIS, ROUTINE W REFLEX MICROSCOPIC
Bilirubin Urine: NEGATIVE
Glucose, UA: NEGATIVE mg/dL
Ketones, ur: NEGATIVE mg/dL
Nitrite: NEGATIVE
Protein, ur: NEGATIVE mg/dL
Specific Gravity, Urine: 1.011 (ref 1.005–1.030)
Urobilinogen, UA: 0.2 mg/dL (ref 0.0–1.0)
pH: 5 (ref 5.0–8.0)

## 2013-03-19 LAB — MICROALBUMIN / CREATININE URINE RATIO
Creatinine, Urine: 42.4 mg/dL
MICROALB UR: 1.01 mg/dL (ref 0.00–1.89)
Microalb Creat Ratio: 23.8 mg/g (ref 0.0–30.0)

## 2013-03-19 LAB — URINALYSIS, MICROSCOPIC ONLY
BACTERIA UA: NONE SEEN
CASTS: NONE SEEN
Crystals: NONE SEEN
SQUAMOUS EPITHELIAL / LPF: NONE SEEN

## 2013-03-19 LAB — TSH: TSH: 1.05 u[IU]/mL (ref 0.350–4.500)

## 2013-04-11 ENCOUNTER — Ambulatory Visit
Admission: RE | Admit: 2013-04-11 | Discharge: 2013-04-11 | Disposition: A | Discharge status: Home / Self Care | Payer: Medicare Other | Source: Ambulatory Visit | Attending: Obstetrics and Gynecology | Admitting: Obstetrics and Gynecology

## 2013-04-11 DIAGNOSIS — Z1239 Encounter for other screening for malignant neoplasm of breast: Secondary | ICD-10-CM

## 2013-04-11 DIAGNOSIS — M8580 Other specified disorders of bone density and structure, unspecified site: Secondary | ICD-10-CM

## 2013-04-11 DIAGNOSIS — Z780 Asymptomatic menopausal state: Secondary | ICD-10-CM | POA: Diagnosis not present

## 2013-04-11 DIAGNOSIS — Z1231 Encounter for screening mammogram for malignant neoplasm of breast: Secondary | ICD-10-CM | POA: Diagnosis not present

## 2013-04-15 ENCOUNTER — Encounter: Payer: Self-pay | Admitting: Internal Medicine

## 2013-04-15 ENCOUNTER — Ambulatory Visit: Payer: Medicare Other | Admitting: Endocrinology

## 2013-04-27 ENCOUNTER — Other Ambulatory Visit: Payer: Self-pay | Admitting: Cardiology

## 2013-05-16 ENCOUNTER — Other Ambulatory Visit: Payer: Self-pay | Admitting: *Deleted

## 2013-05-16 MED ORDER — FA-PYRIDOXINE-CYANOCOBALAMIN 2.5-25-2 MG PO TABS: 1.0000 | ORAL_TABLET | Freq: Every day | ORAL | Status: DC

## 2013-05-19 DIAGNOSIS — I10 Essential (primary) hypertension: Secondary | ICD-10-CM | POA: Diagnosis not present

## 2013-05-19 DIAGNOSIS — M79609 Pain in unspecified limb: Secondary | ICD-10-CM | POA: Diagnosis not present

## 2013-05-19 DIAGNOSIS — H6690 Otitis media, unspecified, unspecified ear: Secondary | ICD-10-CM | POA: Diagnosis not present

## 2013-05-19 DIAGNOSIS — Z8673 Personal history of transient ischemic attack (TIA), and cerebral infarction without residual deficits: Secondary | ICD-10-CM | POA: Diagnosis not present

## 2013-05-19 DIAGNOSIS — E669 Obesity, unspecified: Secondary | ICD-10-CM | POA: Diagnosis not present

## 2013-05-19 DIAGNOSIS — M1990 Unspecified osteoarthritis, unspecified site: Secondary | ICD-10-CM | POA: Diagnosis not present

## 2013-05-19 DIAGNOSIS — E119 Type 2 diabetes mellitus without complications: Secondary | ICD-10-CM | POA: Diagnosis not present

## 2013-05-19 DIAGNOSIS — E785 Hyperlipidemia, unspecified: Secondary | ICD-10-CM | POA: Diagnosis not present

## 2013-05-22 ENCOUNTER — Encounter: Payer: Self-pay | Admitting: Cardiology

## 2013-05-22 ENCOUNTER — Ambulatory Visit (INDEPENDENT_AMBULATORY_CARE_PROVIDER_SITE_OTHER): Payer: Medicare Other | Admitting: Cardiology

## 2013-05-22 VITALS — BP 130/70 | HR 76 | Ht 60.0 in | Wt 178.0 lb

## 2013-05-22 DIAGNOSIS — G459 Transient cerebral ischemic attack, unspecified: Secondary | ICD-10-CM

## 2013-05-22 DIAGNOSIS — I498 Other specified cardiac arrhythmias: Secondary | ICD-10-CM | POA: Diagnosis not present

## 2013-05-22 DIAGNOSIS — R011 Cardiac murmur, unspecified: Secondary | ICD-10-CM

## 2013-05-22 DIAGNOSIS — H3400 Transient retinal artery occlusion, unspecified eye: Secondary | ICD-10-CM

## 2013-05-22 DIAGNOSIS — I10 Essential (primary) hypertension: Secondary | ICD-10-CM | POA: Diagnosis not present

## 2013-05-22 DIAGNOSIS — G453 Amaurosis fugax: Secondary | ICD-10-CM

## 2013-05-22 MED ORDER — LOSARTAN POTASSIUM 50 MG PO TABS: 50.0000 mg | ORAL_TABLET | Freq: Two times a day (BID) | ORAL | Status: DC

## 2013-05-22 NOTE — Patient Instructions (Signed)
Please start Losartan 50 mg one twice a day once you run out of your Diovan Continue all other medications as listed.  Follow up in 6 months with Dr Percival Spanish.  You will receive a letter in the mail 2 months before you are due.  Please call us when you receive this letter to schedule your follow up appointment.

## 2013-05-22 NOTE — Progress Notes (Signed)
HPI The patient presents for followup of difficult to control hypertension.  At the last visit I reduced her Norvasc because of leg edema. Her blood pressure has been well controlled. Her lower externally swelling is improved. She's had no further visual disturbances for which she had a significant workup recently. She's had no further palpitations consistent with SVT. She has had no chest pressure, neck or arm discomfort. She's had no weight gain. She's had no new shortness of breath, PND or orthopnea. She denies any chest discomfort.   Allergies  Allergen Reactions  . Tramadol     Dizziness     Current Outpatient Prescriptions  Medication Sig Dispense Refill  . acetaminophen (TYLENOL) 500 MG tablet Take 2 tablets (1,000 mg total) by mouth every 6 (six) hours as needed for moderate pain.  30 tablet  0  . amLODipine (NORVASC) 5 MG tablet Take one tablet by mouth daily.  90 tablet  2  . aspirin 81 MG tablet Take 81 mg by mouth daily.      Marland Kitchen azithromycin (ZITHROMAX) 250 MG tablet For 5 days      . cholecalciferol (VITAMIN D) 1000 UNITS tablet Take 1,000 Units by mouth daily.        Marland Kitchen dicyclomine (BENTYL) 10 MG capsule Take 1 capsule (10 mg total) by mouth every 6 (six) hours as needed (abdominal pain- may take 2 capsules if 1 ineffective).  60 capsule  0  . esomeprazole (NEXIUM) 40 MG capsule Take 1 capsule (40 mg total) by mouth daily before breakfast.  90 capsule  3  . Ferrous Sulfate (IRON) 28 MG TABS Take 1 tablet by mouth daily as needed (doesn't take everyday due to constipation).       . folic acid-pyridoxine-cyancobalamin (FOLBIC) 2.5-25-2 MG TABS Take 1 tablet by mouth daily.  90 tablet  1  . metFORMIN (GLUCOPHAGE-XR) 500 MG 24 hr tablet Take 2 tablets (1,000 mg total) by mouth 2 (two) times daily.  180 tablet  1  . metoCLOPramide (REGLAN) 5 MG tablet Take 5 mg by mouth 4 (four) times daily. Take one tablet by mouth before each meal and every night at bedtrime      . metoprolol  tartrate (LOPRESSOR) 25 MG tablet Take 1 tablet (25 mg total) by mouth 2 (two) times   daily.  60 tablet  6  . Multiple Vitamin (MULTIVITAMIN) tablet Take 1 tablet by mouth daily.        . ondansetron (ZOFRAN) 4 MG tablet Take 4 mg by mouth every 4 (four) hours as needed. For nausea        . potassium chloride (K-DUR,KLOR-CON) 10 MEQ tablet Take 1 tablet (10 mEq total) by mouth 2 (two) times daily.  180 tablet  3  . Propylene Glycol (SYSTANE BALANCE OP) Apply 1 drop to eye 2 (two) times daily.      . rosuvastatin (CRESTOR) 10 MG tablet Take 1 tablet (10 mg total) by mouth daily.  30 tablet  11  . valsartan (DIOVAN) 320 MG tablet Take 1 tablet by mouth  daily  90 tablet  3   No current facility-administered medications for this visit.    Past Medical History  Diagnosis Date  . Gastroparesis   . Iron deficiency anemia, unspecified   . Pure hypercholesterolemia   . Headache(784.0)   . Unspecified essential hypertension   . Esophageal reflux   . Type II or unspecified type diabetes mellitus without mention of complication, not stated as uncontrolled   .  Arthritis     Spinal Osteoarthritis  . Diverticulosis 08/30/2000    Colonoscopy   . Gastric polyp     Fundic Gland  . Anxiety   . Cataract   . Heart murmur     Echocardiogram 2/11: EF 60-65%, mild LAE, grade 1 diastolic dysfunction, aortic valve sclerosis, mean gradient 9 mm of mercury, PASP 34  . Chest pain     Myoview 1/11: EF 82%, no ischemia.  . Carcinoid tumor of stomach   . PSVT (paroxysmal supraventricular tachycardia)   . Stroke     tia, 2014  . Leg swelling     bilateral  . Chronic kidney disease     Left kidney smaller than right kidney  . Cancer   . PONV (postoperative nausea and vomiting)     pt states only needs small amount of anesthesia    Past Surgical History  Procedure Laterality Date  . Cholecystectomy  1993  . Tonsillectomy    . Colonoscopy  11/11/2010    diverticulosis  . Esophagogastroduodenoscopy   08/29/2010; 09/15/2010    Carcinoid tumor less than 1 cm in July 2012 not seen in August 2012 , gastritis, fundic gland polyps  . Eus  12/15/2010    Procedure: UPPER ENDOSCOPIC ULTRASOUND (EUS) LINEAR;  Surgeon: Owens Loffler, MD;  Location: [facility];  Service: Endoscopy;  Laterality: N/A;  . Esophagogastroduodenoscopy  05/16/2011  . Esophagogastroduodenoscopy  06/14/2012  . Dilatation & currettage/hysteroscopy with resectocope N/A 02/25/2013    Procedure: Attempted hysteroscopy with uterine perforation;  Surgeon: Jamey Reas de Berton Lan, MD;  Location: [facility];  Service: Gynecology;  Laterality: N/A;  . Laparoscopy N/A 02/25/2013    Procedure: Cystoscopy and laparoscopy with fulguration of uterine serosa;  Surgeon: Jamey Reas de Berton Lan, MD;  Location: [facility];  Service: Gynecology;  Laterality: N/A;    ROS:  As stated in the HPI and negative for all other systems.  PHYSICAL EXAM BP 130/70  Pulse 76  Ht 5' (1.524 m)  Wt 178 lb (80.74 kg)  BMI 34.76 kg/m2  LMP 02/07/1992 GENERAL:  Well appearing HEENT:  Pupils equal round and reactive, fundi not visualized, oral mucosa unremarkable NECK:  No jugular venous distention, waveform within normal limits, carotid upstroke brisk and symmetric, no bruits, no thyromegaly LYMPHATICS:  No cervical, inguinal adenopathy LUNGS:  Clear to auscultation bilaterally BACK:  No CVA tenderness CHEST:  Unremarkable HEART:  PMI not displaced or sustained,S1 and S2 within normal limits, no S3, no S4, no clicks, no rubs, slight systolic murmur peaking and heard best at the right upper sternal border, no diastolicmurmurs ABD:  Flat, positive bowel sounds normal in frequency in pitch, no bruits, no rebound, no guarding, no midline pulsatile mass, no hepatomegaly, no splenomegaly EXT:  2 plus pulses throughout, mild bilateral ankle edema, no cyanosis no clubbing  EKG:  Sinus rhythm, rate 76, axis within normal limits, intervals within normal  limits, no acute ST-T wave changes.  Low voltage precordial lead. 05/22/2013  ASSESSMENT AND PLAN  Hypertension - She cannot afford the Diovan anymore and would like to switch and so I will switch her to Cozaar 50 mg twice a day. She will start this when she runs out of her current Diovan. She will keep an eye on her blood pressure and let us know if this is making her target.  Supraventricular Tachycardia - This is not particularly bothersome at this point. No further workup is suggested.  Dyslipidemia -  She will remain on the meds as listed.  Chest pain -  She has had no new symptoms.  No further testing is indicated.   Carotid stenosis - She has some mild plaque and will be checked again in September  Murmur - This is mild aortic stenosis. We will follow this clinically.

## 2013-05-27 DIAGNOSIS — E119 Type 2 diabetes mellitus without complications: Secondary | ICD-10-CM | POA: Diagnosis not present

## 2013-05-28 ENCOUNTER — Ambulatory Visit (INDEPENDENT_AMBULATORY_CARE_PROVIDER_SITE_OTHER): Payer: Medicare Other | Admitting: Nurse Practitioner

## 2013-05-28 ENCOUNTER — Ambulatory Visit (AMBULATORY_SURGERY_CENTER): Payer: Self-pay | Admitting: *Deleted

## 2013-05-28 ENCOUNTER — Encounter: Payer: Self-pay | Admitting: Nurse Practitioner

## 2013-05-28 VITALS — BP 120/60 | HR 78 | Ht 61.0 in | Wt 177.0 lb

## 2013-05-28 VITALS — Ht 61.0 in | Wt 177.0 lb

## 2013-05-28 DIAGNOSIS — D3A092 Benign carcinoid tumor of the stomach: Secondary | ICD-10-CM

## 2013-05-28 DIAGNOSIS — R109 Unspecified abdominal pain: Secondary | ICD-10-CM | POA: Diagnosis not present

## 2013-05-28 MED ORDER — DICYCLOMINE HCL 10 MG PO CAPS: 10.0000 mg | ORAL_CAPSULE | Freq: Four times a day (QID) | ORAL | Status: DC | PRN

## 2013-05-28 NOTE — Progress Notes (Signed)
     History of Present Illness:   This is a 73 year old female known to Dr. Carlean Purl.  She has a history of GERD and IBS. In 2012 and EGD revealed multiple gastric polyps, one of which was a carcinoid. EUS was negative. Subsequent EGDs have shown multiple gastric polyps (path c/w fundic gland polyps). Patient is due for surveillance EGD and was actually in our Pre-Visit Nurse's office today making preparations for the procedure when she complained of abdominal pain and was sent down to be seen in clinic  Patient describes chronic, intermittent, non-radiating mid upper abdominal pain and fullness. She feels like everything is "pushed up" into upper abdomen. No significant nausea. Pain Two days ago pain was constant, yesterday and today more intermittent. Patient is out of Dicylomine. She isn't taking Reglan on regular basis, scared of potential side effects. BMs are at baseline ( loose).   Current Medications, Allergies, Past Medical History, Past Surgical History, Family History and Social History were reviewed in [employer] record.  Physical Exam: General: Pleasant, well developed , female in no acute distress Head: Normocephalic and atraumatic Eyes:  sclerae anicteric, conjunctiva pink  Ears: Normal auditory acuity Lungs: Clear throughout to auscultation Heart: Regular rate and rhythm Abdomen: Soft, non distended, non-tender. No masses, no hepatomegaly. Normal bowel sounds Musculoskeletal: Symmetrical with no gross deformities  Extremities: No edema  Neurological: Alert oriented x 4, grossly nonfocal Psychological:  Alert and cooperative. Normal mood and affect  Assessment and Recommendations:  44.  73 year old female with chronic intermittent mid upper abdominal pain  / fullness felt to be related to IBS. Bentyl and defecation help the pain but she has been out of Bentyl. Patient has gastroparesis but not taking Reglan on a regular basis.  Patient is well known to  Dr. Carlean Purl who described these same symptoms at previous visits.  Since Bentyl helps I will refill it for her. We discussed Reglan. Rather than skipping doses here and there she will take it twice daily before heaviest meal.   2. History of gastric carcinoid tumor 2012. Subsequent EUS in 2012 was normal. She is already scheduled for surveillance EGD.

## 2013-05-28 NOTE — Progress Notes (Signed)
No egg or soy allergy. No anesthesia problems.  No home O2.  No diet meds.  

## 2013-05-28 NOTE — Patient Instructions (Signed)
We have sent the following medications to your pharmacy for you to pick up at your convenience: Bentyl Reglan 5 mg, please take twice daily before your heaviest meals

## 2013-05-29 NOTE — Progress Notes (Signed)
Agree with Ms. Guenther's assessment and plan. Giliana Vantil E. Unita Detamore, MD, FACG   

## 2013-06-06 NOTE — Addendum Note (Signed)
Addended by: Lowry Ram on: 06/06/2013 03:23 PM   Modules accepted: Level of Service

## 2013-06-11 ENCOUNTER — Telehealth: Payer: Self-pay | Admitting: *Deleted

## 2013-06-11 ENCOUNTER — Encounter: Payer: Medicare Other | Admitting: Internal Medicine

## 2013-06-11 MED ORDER — ALPRAZOLAM 0.25 MG PO TABS: 0.2500 mg | ORAL_TABLET | Freq: Three times a day (TID) | ORAL | Status: DC | PRN

## 2013-06-11 NOTE — Telephone Encounter (Signed)
[facility], [place] [address] Triage Cc: Ellwood Dense, RN            This patients husband is actively dying. Can we call her in some Xanax. 0.25 mg tid prn. Disp number 30 no refills. PO. Thanks.     Left message for pt that I called [facility] where she usually gets medications filled.  Called RX as ordered.  Requested they call back with any questions or concerns.

## 2013-06-12 ENCOUNTER — Telehealth: Payer: Self-pay

## 2013-06-12 NOTE — Telephone Encounter (Signed)
Message copied by [place], Jaylenn Baiza E on Thu Jun 12, 2013 12:07 PM ------      Message from: Silvano Rusk E      Created: Thu Jun 12, 2013  9:30 AM      Regarding: appointment ?       She cancelled 5/6 EGD      Has appointment to follow-up after procedure - next week      ? If she still needs that appt or needs to schedule both            Please check with her ------

## 2013-06-12 NOTE — Telephone Encounter (Signed)
Left message for patient to call me back to discuss appointments.

## 2013-06-13 NOTE — Telephone Encounter (Signed)
Left message on her mobile # to call me on Monday to discuss r/s 'ing EGD and f/u appointments.

## 2013-06-15 ENCOUNTER — Other Ambulatory Visit: Payer: Self-pay | Admitting: Cardiology

## 2013-06-15 DIAGNOSIS — I10 Essential (primary) hypertension: Secondary | ICD-10-CM

## 2013-06-15 MED ORDER — VALSARTAN 320 MG PO TABS: 320.0000 mg | ORAL_TABLET | Freq: Every day | ORAL | Status: DC

## 2013-06-15 NOTE — Progress Notes (Signed)
Ms. Abeyta called answering service to report she "was out of her BP meds." Dr. Percival Spanish recently changed Diovan to Cozaar due to cost. However, Ms. Chovanec never started Cozaar and now has decided she wants to remain on Diovan even though it is more expensive. I discontinued Cozaar and sent new prescription for Diovan 320 mg [facility] [place].

## 2013-06-16 NOTE — Telephone Encounter (Signed)
Patient called in and cancelled upcoming appointment with the schedulers, didn't r/s EGD.

## 2013-06-18 ENCOUNTER — Ambulatory Visit: Payer: Medicare Other | Admitting: Internal Medicine

## 2013-06-24 ENCOUNTER — Telehealth: Payer: Self-pay | Admitting: Internal Medicine

## 2013-06-24 NOTE — Telephone Encounter (Signed)
Patient is rescheduled for 08/13/13. She is concerned about how far out the procedure is scheduled.  She does not want an pm appt and "recently buried my husband" she feels the 5/29 opening is too close.  She is placed on the cancellation list.

## 2013-07-01 DIAGNOSIS — E119 Type 2 diabetes mellitus without complications: Secondary | ICD-10-CM | POA: Diagnosis not present

## 2013-07-10 DIAGNOSIS — H9209 Otalgia, unspecified ear: Secondary | ICD-10-CM | POA: Diagnosis not present

## 2013-07-14 ENCOUNTER — Telehealth: Payer: Self-pay | Admitting: Internal Medicine

## 2013-07-14 NOTE — Telephone Encounter (Signed)
Patient wants to set up a follow up visit after her procedure .  She is set up for 09/10/13

## 2013-07-16 ENCOUNTER — Ambulatory Visit (INDEPENDENT_AMBULATORY_CARE_PROVIDER_SITE_OTHER): Payer: Medicare Other | Admitting: Diagnostic Neuroimaging

## 2013-07-16 ENCOUNTER — Encounter (INDEPENDENT_AMBULATORY_CARE_PROVIDER_SITE_OTHER): Payer: Self-pay

## 2013-07-16 ENCOUNTER — Encounter: Payer: Self-pay | Admitting: Diagnostic Neuroimaging

## 2013-07-16 VITALS — BP 124/69 | HR 68 | Temp 98.2°F | Ht 60.0 in | Wt 172.0 lb

## 2013-07-16 DIAGNOSIS — R51 Headache: Secondary | ICD-10-CM

## 2013-07-16 DIAGNOSIS — M542 Cervicalgia: Secondary | ICD-10-CM

## 2013-07-16 NOTE — Patient Instructions (Signed)
Try physical therapy

## 2013-07-16 NOTE — Progress Notes (Signed)
PATIENT: [name] DOB: 1940-04-15   REASON FOR VISIT: follow up HISTORY FROM: patient  HISTORY OF PRESENT ILLNESS:  UPDATE 07/16/13 (VRP): Since last visit, no further visual events. Unfortunately, her husband passed away 1 month ago, and she is continuing to grieve and struggle with stress. In last 2-3 weeks, she has had left neck pain, left ear pain, left temple pain, left arm pain/numbness, that is intermittent. Trouble laying on her left side.   UPDATE 01/15/13 (LL):  Since last visit, patient has seen cardiology; cardiac event monitor did not show any arrhythmias.  Her carotid doppler was negative for significant stenosis. Her CRP and ESR were normal.  MRI brain shows no acute intracranial abnormality or significant interval change.  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 This is likely related to chronic atrophy or congenital anomaly. She has had no further vision problems or stroke/TIA symptoms.  She was switched to Plavix at her last visit here but has since switched back to aspirin on her own due to a sore feeling in her throat while taking plavix.  She states her tongue felt thick and the neck (points to cervical lymph nodes) hurt.  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  10/16/12 (VP):  Approximately 2 weeks ago, patient had been watching television when all of a sudden everything went out of focus. She then noticed alternating white and black sensation from her left eye. This lasted for approximately 15 minutes. She says she cannot see any objects out of her left eye when she covered her right eye with her hand. She says that she remembers a "white" vision rather than fully black. She had some dizziness at that time. She checked her blood pressure which was elevated systolic blood pressure greater than 170. She also noted some left eye pain and left arm weakness during this event. Within 15 minutes symptoms had  resolved. No headache during this event. Patient has had prior episodes suspicious for TIA in the past in 2012 and 2013. Patient sees cardiology for evaluation and management of supraventricular tachycardia episode.   REVIEW OF SYSTEMS: Full 14 system review of systems performed and notable only for murmur swelling of legs eye pain, light sensitivity, eye discharge, flushing, ringing in ears, joint pain, numbness, headache, dizziness.  ALLERGIES: Allergies  Allergen Reactions  . Tramadol     Dizziness     HOME MEDICATIONS: Outpatient Prescriptions Prior to Visit  Medication Sig Dispense Refill  . acetaminophen (TYLENOL) 500 MG tablet Take 2 tablets (1,000 mg total) by mouth every 6 (six) hours as needed for moderate pain.  30 tablet  0  . ALPRAZolam (XANAX) 0.25 MG tablet Take 1 tablet (0.25 mg total) by mouth 3 (three) times daily as needed for anxiety.  30 tablet  0  . amLODipine (NORVASC) 5 MG tablet Take one tablet by mouth daily.  90 tablet  2  . aspirin 81 MG tablet Take 81 mg by mouth daily.      . cholecalciferol (VITAMIN D) 1000 UNITS tablet Take 1,000 Units by mouth daily.        Marland Kitchen dicyclomine (BENTYL) 10 MG capsule Take 1 capsule (10 mg total) by mouth every 6 (six) hours as needed (abdominal pain- may take 2 capsules if 1 ineffective).  60 capsule  1  . esomeprazole (NEXIUM) 40 MG capsule Take 1 capsule (40 mg total) by mouth daily before breakfast.  90 capsule  3  .  Ferrous Sulfate (IRON) 28 MG TABS Take 1 tablet by mouth daily as needed (doesn't take everyday due to constipation).       . folic acid-pyridoxine-cyancobalamin (FOLBIC) 2.5-25-2 MG TABS Take 1 tablet by mouth daily.  90 tablet  1  . metFORMIN (GLUCOPHAGE-XR) 500 MG 24 hr tablet Take 2 tablets (1,000 mg total) by mouth 2 (two) times daily.  180 tablet  1  . metoprolol tartrate (LOPRESSOR) 25 MG tablet Take 1 tablet (25 mg total) by mouth 2 (two) times   daily.  60 tablet  6  . Multiple Vitamin (MULTIVITAMIN) tablet  Take 1 tablet by mouth daily.        . ondansetron (ZOFRAN) 4 MG tablet Take 4 mg by mouth every 4 (four) hours as needed. For nausea        . potassium chloride (K-DUR,KLOR-CON) 10 MEQ tablet Take 1 tablet (10 mEq total) by mouth 2 (two) times daily.  180 tablet  3  . Propylene Glycol (SYSTANE BALANCE OP) Apply 1 drop to eye 2 (two) times daily.      . rosuvastatin (CRESTOR) 10 MG tablet Take 1 tablet (10 mg total) by mouth daily.  30 tablet  11  . valsartan (DIOVAN) 320 MG tablet Take 1 tablet (320 mg total) by mouth daily.  30 tablet  4   No facility-administered medications prior to visit.    PAST MEDICAL HISTORY: Past Medical History  Diagnosis Date  . Gastroparesis   . Iron deficiency anemia, unspecified   . Pure hypercholesterolemia   . Headache(784.0)   . Unspecified essential hypertension   . Esophageal reflux   . Type II or unspecified type diabetes mellitus without mention of complication, not stated as uncontrolled   . Arthritis     Spinal Osteoarthritis  . Diverticulosis 08/30/2000    Colonoscopy   . Gastric polyp     Fundic Gland  . Anxiety   . Cataract   . Heart murmur     Echocardiogram 2/11: EF 60-65%, mild LAE, grade 1 diastolic dysfunction, aortic valve sclerosis, mean gradient 9 mm of mercury, PASP 34  . Chest pain     Myoview 1/11: EF 82%, no ischemia.  . Carcinoid tumor of stomach   . PSVT (paroxysmal supraventricular tachycardia)   . Leg swelling     bilateral  . Chronic kidney disease     Left kidney smaller than right kidney  . Cancer   . PONV (postoperative nausea and vomiting)     pt states only needs small amount of anesthesia  . Stroke     tia, 2014    PAST SURGICAL HISTORY: Past Surgical History  Procedure Laterality Date  . Cholecystectomy  1993  . Tonsillectomy    . Colonoscopy  11/11/2010    diverticulosis  . Esophagogastroduodenoscopy  08/29/2010; 09/15/2010    Carcinoid tumor less than 1 cm in July 2012 not seen in August 2012 ,  gastritis, fundic gland polyps  . Eus  12/15/2010    Procedure: UPPER ENDOSCOPIC ULTRASOUND (EUS) LINEAR;  Surgeon: Owens Loffler, MD;  Location: [facility];  Service: Endoscopy;  Laterality: N/A;  . Esophagogastroduodenoscopy  05/16/2011  . Esophagogastroduodenoscopy  06/14/2012  . Dilatation & currettage/hysteroscopy with resectocope N/A 02/25/2013    Procedure: Attempted hysteroscopy with uterine perforation;  Surgeon: Jamey Reas de Berton Lan, MD;  Location: [facility];  Service: Gynecology;  Laterality: N/A;  . Laparoscopy N/A 02/25/2013    Procedure: Cystoscopy and laparoscopy with fulguration of  uterine serosa;  Surgeon: Jamey Reas de Berton Lan, MD;  Location: [facility];  Service: Gynecology;  Laterality: N/A;    FAMILY HISTORY: Family History  Problem Relation Age of Onset  . Diabetes Mother   . Stroke Father     deceased age 39  . Heart disease Sister     deceased MI age 15  . Heart disease Brother     deceased MI age 52  . Colon cancer Neg Hx   . Esophageal cancer Neg Hx   . Stomach cancer Neg Hx   . Rectal cancer Neg Hx     SOCIAL HISTORY: History   Social History  . Marital Status: Married    Spouse Name: N/A    Number of Children: 0  . Years of Education: college   Occupational History  . Retail    Social History Main Topics  . Smoking status: Never Smoker   . Smokeless tobacco: Never Used     Comment: Daily Caffeine Use -1  . Alcohol Use: No  . Drug Use: No  . Sexual Activity: No   Other Topics Concern  . Not on file   Social History Narrative   Patient is married ([name]) and lives with her husband.   Patient does not have any children.   Patient is right-handed.   Patient has a BA degree.   One caffeine drink daily      PHYSICAL EXAM  Filed Vitals:   07/16/13 1447  BP: 124/69  Pulse: 68  Temp: 98.2 F (36.8 C)  TempSrc: Oral  Height: 5' (1.524 m)  Weight: 172 lb (78.019 kg)   Body mass index is 33.59  kg/(m^2).  GENERAL EXAM: Patient is in no distress; well developed, nourished and groomed; neck is supple  CARDIOVASCULAR: Regular rate and rhythm, MILD SYSTOLIC MURMUR, no carotid bruits  NEUROLOGIC: MENTAL STATUS: awake, alert, language fluent, comprehension intact, naming intact, fund of knowledge appropriate; SOFT SPOKEN.  CRANIAL NERVE: no papilledema on fundoscopic exam, pupils equal and reactive to light, visual fields full to confrontation, extraocular muscles intact, no nystagmus, facial sensation and strength symmetric, hearing intact, palate elevates symmetrically, uvula midline, shoulder shrug symmetric, tongue midline. MOTOR: normal bulk and tone, full strength in the BUE, BLE SENSORY: normal and symmetric to light touch, temperature, vibration  COORDINATION: finger-nose-finger, fine finger movements normal REFLEXES: deep tendon reflexes present and symmetric GAIT/STATION: narrow based gait; able to walk on toes, heels and tandem; romberg is negative   DIAGNOSTIC DATA (LABS, IMAGING, TESTING) - I reviewed patient records, labs, notes, testing and imaging myself where available.  Lab Results  Component Value Date   WBC 7.2 03/18/2013   HGB 11.1* 03/18/2013   HCT 34.0* 03/18/2013   MCV 77.1* 03/18/2013   PLT 250 03/18/2013      Component Value Date/Time   NA 138 03/18/2013 1539   K 4.5 03/18/2013 1539   CL 102 03/18/2013 1539   CO2 27 03/18/2013 1539   GLUCOSE 83 03/18/2013 1539   BUN 17 03/18/2013 1539   CREATININE 0.67 03/18/2013 1539   CREATININE 0.64 02/25/2013 1230   CALCIUM 10.2 03/18/2013 1539   PROT 7.2 03/18/2013 1539   ALBUMIN 4.3 03/18/2013 1539   AST 24 03/18/2013 1539   ALT 22 03/18/2013 1539   ALKPHOS 68 03/18/2013 1539   BILITOT 0.5 03/18/2013 1539   GFRNONAA 88* 02/25/2013 1230   GFRAA >90 02/25/2013 1230   Lab Results  Component Value Date   CHOL  184 03/18/2013   HDL 72 03/18/2013   LDLCALC 90 03/18/2013   LDLDIRECT 123.7 04/13/2011   TRIG 111 03/18/2013    CHOLHDL 2.6 03/18/2013   Lab Results  Component Value Date   HGBA1C 6.7* 03/18/2013   Lab Results  Component Value Date   VITAMINB12 >1500 pg/mL* 12/14/2008   Lab Results  Component Value Date   TSH 1.050 03/18/2013   Lab Results  Component Value Date   ESRSEDRATE 31 10/28/2012    10/01/12 MRI brain 1. No acute intracranial abnormality or significant interval change.  2. 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 This is likely related to chronic atrophy or congenital anomaly.  10/31/12 carotid u/s - normal  10/25/12 TTE - 55-60%; no source of emboli    ASSESSMENT AND PLAN  73 y.o. year old female here with left face, neck, arm pain. May be migraine phenomenon vs cervical radiculopathy. Will try conservative therapy.   PLAN: - PT evaluation for neck pain; consider massage / relaxation techniques as well - tylenol prn neck pain   Return if symptoms worsen or fail to improve.   Penni Bombard, MD 0/93/2671, 2:45 PM Certified in Neurology, Neurophysiology and Neuroimaging  [facility] [address], [place], [place] [address] [phone]

## 2013-08-07 ENCOUNTER — Ambulatory Visit (INDEPENDENT_AMBULATORY_CARE_PROVIDER_SITE_OTHER): Payer: Medicare Other | Admitting: Obstetrics and Gynecology

## 2013-08-07 ENCOUNTER — Ambulatory Visit (INDEPENDENT_AMBULATORY_CARE_PROVIDER_SITE_OTHER): Payer: Medicare Other

## 2013-08-07 ENCOUNTER — Encounter: Payer: Self-pay | Admitting: Obstetrics and Gynecology

## 2013-08-07 VITALS — BP 138/82 | HR 66 | Resp 16 | Ht 60.0 in | Wt 173.0 lb

## 2013-08-07 DIAGNOSIS — D259 Leiomyoma of uterus, unspecified: Secondary | ICD-10-CM

## 2013-08-07 DIAGNOSIS — R9389 Abnormal findings on diagnostic imaging of other specified body structures: Secondary | ICD-10-CM | POA: Diagnosis not present

## 2013-08-07 DIAGNOSIS — G459 Transient cerebral ischemic attack, unspecified: Secondary | ICD-10-CM

## 2013-08-07 DIAGNOSIS — N9489 Other specified conditions associated with female genital organs and menstrual cycle: Secondary | ICD-10-CM

## 2013-08-07 DIAGNOSIS — Z634 Disappearance and death of family member: Secondary | ICD-10-CM | POA: Diagnosis not present

## 2013-08-07 NOTE — Progress Notes (Signed)
GYNECOLOGY  VISIT   HPI: 73 y.o.   Married  Caucasian  female   G0P0 with Patient's last menstrual period was 02/07/1992.   here for recheck of endometrium.  Her ultrasound in the office on 12/26/12 showed thickening of endometrium at fundus 8 x 5 mm. Small intramural fibroid 10 mm. Normal ovaries. No free fluid.   Had attempted hysteroscopy with dilation and curettage for removal of expected polyp but had uterine perforation and inability to do endometrial sampling. Stenotic nulliparous cervix.  Had laparoscopy with cautery of uterine serosa at that time.  Had GYN Onc consultation who recommended repeating U/S in 6 months.   Patient had not had PM bleeding.  Not taking any hormonal therapy.   Husband passed away 2 months ago.   Patient states she is very alone.  Feels very sad.   Not really able to concentrate much.   GYNECOLOGIC HISTORY: Patient's last menstrual period was 02/07/1992.          OB History   Grav Para Term Preterm Abortions TAB SAB Ect Mult Living   0                  Patient Active Problem List   Diagnosis Date Noted  . Abdominal pain, unspecified site 05/28/2013  . Status post laparoscopy 02/25/2013  . Hyponatremia 01/09/2013  . GERD (gastroesophageal reflux disease) 01/09/2013  . Amaurosis fugax of left eye 10/16/2012  . Amaurosis fugax 09/23/2012  . Low back pain 06/03/2012  . Vitamin D deficiency 03/11/2012  . Bilateral hand pain 10/20/2011  . Encounter for long-term (current) use of other medications 10/20/2011  . IBS (irritable bowel syndrome) 08/14/2011  . TIA (transient ischemic attack) 02/10/2011  . Abnormal brain MRI 01/19/2011  . Allergic rhinitis 10/01/2010  . Myalgia 09/30/2010  . 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 FUNDIC GLAND POLYPS OF STOMACH 03/18/2010  . EPIGASTRIC PAIN chronic 03/18/2010  . ARTHRALGIA 03/17/2009  . SYSTOLIC MURMUR 87/86/7672  . SUPRAVENTRICULAR TACHYCARDIA 01/12/2009  . PLANTAR FASCIITIS  06/08/2008  . CHEST PAIN 05/18/2008  . Gastroparesis 12/18/2007  . HYPERCHOLESTEROLEMIA 06/11/2007  . DIABETES MELLITUS, TYPE II 09/05/2006  . HYPERTENSION 09/05/2006    Past Medical History  Diagnosis Date  . Gastroparesis   . Iron deficiency anemia, unspecified   . Pure hypercholesterolemia   . Headache(784.0)   . Unspecified essential hypertension   . Esophageal reflux   . Type II or unspecified type diabetes mellitus without mention of complication, not stated as uncontrolled   . Arthritis     Spinal Osteoarthritis  . Diverticulosis 08/30/2000    Colonoscopy   . Gastric polyp     Fundic Gland  . Anxiety   . Cataract   . Heart murmur     Echocardiogram 2/11: EF 60-65%, mild LAE, grade 1 diastolic dysfunction, aortic valve sclerosis, mean gradient 9 mm of mercury, PASP 34  . Chest pain     Myoview 1/11: EF 82%, no ischemia.  . Carcinoid tumor of stomach   . PSVT (paroxysmal supraventricular tachycardia)   . Leg swelling     bilateral  . Chronic kidney disease     Left kidney smaller than right kidney  . Cancer   . PONV (postoperative nausea and vomiting)     pt states only needs small amount of anesthesia  . Stroke     tia, 2014    Past Surgical History  Procedure Laterality Date  . Cholecystectomy  1993  .  Tonsillectomy    . Colonoscopy  11/11/2010    diverticulosis  . Esophagogastroduodenoscopy  08/29/2010; 09/15/2010    Carcinoid tumor less than 1 cm in July 2012 not seen in August 2012 , gastritis, fundic gland polyps  . Eus  12/15/2010    Procedure: UPPER ENDOSCOPIC ULTRASOUND (EUS) LINEAR;  Surgeon: Owens Loffler, MD;  Location: [facility];  Service: Endoscopy;  Laterality: N/A;  . Esophagogastroduodenoscopy  05/16/2011  . Esophagogastroduodenoscopy  06/14/2012  . Dilatation & currettage/hysteroscopy with resectocope N/A 02/25/2013    Procedure: Attempted hysteroscopy with uterine perforation;  Surgeon: Jamey Reas de Berton Lan, MD;  Location: [facility];   Service: Gynecology;  Laterality: N/A;  . Laparoscopy N/A 02/25/2013    Procedure: Cystoscopy and laparoscopy with fulguration of uterine serosa;  Surgeon: Jamey Reas de Berton Lan, MD;  Location: [facility];  Service: Gynecology;  Laterality: N/A;    Current Outpatient Prescriptions  Medication Sig Dispense Refill  . acetaminophen (TYLENOL) 500 MG tablet Take 2 tablets (1,000 mg total) by mouth every 6 (six) hours as needed for moderate pain.  30 tablet  0  . ALPRAZolam (XANAX) 0.25 MG tablet Take 1 tablet (0.25 mg total) by mouth 3 (three) times daily as needed for anxiety.  30 tablet  0  . amLODipine (NORVASC) 5 MG tablet Take one tablet by mouth daily.  90 tablet  2  . aspirin 81 MG tablet Take 81 mg by mouth daily.      . cholecalciferol (VITAMIN D) 1000 UNITS tablet Take 1,000 Units by mouth daily.        Marland Kitchen dicyclomine (BENTYL) 10 MG capsule Take 1 capsule (10 mg total) by mouth every 6 (six) hours as needed (abdominal pain- may take 2 capsules if 1 ineffective).  60 capsule  1  . esomeprazole (NEXIUM) 40 MG capsule Take 1 capsule (40 mg total) by mouth daily before breakfast.  90 capsule  3  . folic acid-pyridoxine-cyancobalamin (FOLBIC) 2.5-25-2 MG TABS Take 1 tablet by mouth daily.  90 tablet  1  . metFORMIN (GLUCOPHAGE-XR) 500 MG 24 hr tablet Take 2 tablets (1,000 mg total) by mouth 2 (two) times daily.  180 tablet  1  . metoCLOPramide (REGLAN) 5 MG tablet Take 5 mg by mouth 2 (two) times daily.      . metoprolol tartrate (LOPRESSOR) 25 MG tablet Take 1 tablet (25 mg total) by mouth 2 (two) times   daily.  60 tablet  6  . Multiple Vitamin (MULTIVITAMIN) tablet Take 1 tablet by mouth daily.        . ondansetron (ZOFRAN) 4 MG tablet Take 4 mg by mouth every 4 (four) hours as needed. For nausea        . potassium chloride (K-DUR,KLOR-CON) 10 MEQ tablet Take 1 tablet (10 mEq total) by mouth 2 (two) times daily.  180 tablet  3  . Propylene Glycol (SYSTANE BALANCE OP) Apply 1 drop  to eye 2 (two) times daily.      . rosuvastatin (CRESTOR) 10 MG tablet Take 1 tablet (10 mg total) by mouth daily.  30 tablet  11  . valsartan (DIOVAN) 320 MG tablet Take 1 tablet (320 mg total) by mouth daily.  30 tablet  4   No current facility-administered medications for this visit.     ALLERGIES: Tramadol  Family History  Problem Relation Age of Onset  . Diabetes Mother   . Stroke Father     deceased age 16  .  Heart disease Sister     deceased MI age 53  . Heart disease Brother     deceased MI age 46  . Colon cancer Neg Hx   . Esophageal cancer Neg Hx   . Stomach cancer Neg Hx   . Rectal cancer Neg Hx     History   Social History  . Marital Status: Married    Spouse Name: N/A    Number of Children: 0  . Years of Education: college   Occupational History  . Retail    Social History Main Topics  . Smoking status: Never Smoker   . Smokeless tobacco: Never Used     Comment: Daily Caffeine Use -1  . Alcohol Use: No  . Drug Use: No  . Sexual Activity: No   Other Topics Concern  . Not on file   Social History Narrative   Patient is married ([name]) and lives with her husband.   Patient does not have any children.   Patient is right-handed.   Patient has a BA degree.   One caffeine drink daily     ROS:  Pertinent items are noted in HPI.  PHYSICAL EXAMINATION:    BP 138/82  Pulse 66  Resp 16  Ht 5' (1.524 m)  Wt 173 lb (78.472 kg)  BMI 33.79 kg/m2  LMP 02/07/1992     General appearance: alert, cooperative and appears stated age.  Appears sad and is tearful.   Ultrasound -   Uterus with fibroid 1.4 cm and EMS 7.68 mm.  Normal ovaries.  No free fluid.    ASSESSMENT  Thickened endometrium 7.68 mm. Essentially unchanged.  I believe that there is a polyp present in the endometrial cavity.  This has not been successfully biopsied in office or hospital setting.  Uterine fibroid 1.4 cm. Very slightly larger.  PLAN  Repeat pelvic ultrasound in 6  months again.  Will sent note to Dr. Alycia Rossetti for review.  Bereavement support given.  I recommend bereavement counseling at the [facility].  Patient took their phone number and will consider calling.  I also discussed some reading material for when she is ready.    An After Visit Summary was printed and given to the patient.  __25____ minutes face to face time of which over 50% was spent in counseling.

## 2013-08-13 ENCOUNTER — Encounter: Payer: Medicare Other | Admitting: Internal Medicine

## 2013-08-13 ENCOUNTER — Encounter: Payer: Self-pay | Admitting: Internal Medicine

## 2013-08-13 ENCOUNTER — Ambulatory Visit (AMBULATORY_SURGERY_CENTER): Payer: Medicare Other | Admitting: Internal Medicine

## 2013-08-13 VITALS — BP 146/78 | HR 70 | Temp 97.6°F | Resp 19 | Ht 61.0 in | Wt 177.0 lb

## 2013-08-13 DIAGNOSIS — E119 Type 2 diabetes mellitus without complications: Secondary | ICD-10-CM | POA: Diagnosis not present

## 2013-08-13 DIAGNOSIS — D3A092 Benign carcinoid tumor of the stomach: Secondary | ICD-10-CM | POA: Diagnosis not present

## 2013-08-13 DIAGNOSIS — D131 Benign neoplasm of stomach: Secondary | ICD-10-CM

## 2013-08-13 DIAGNOSIS — C169 Malignant neoplasm of stomach, unspecified: Secondary | ICD-10-CM | POA: Diagnosis not present

## 2013-08-13 LAB — GLUCOSE, CAPILLARY
GLUCOSE-CAPILLARY: 119 mg/dL — AB (ref 70–99)
Glucose-Capillary: 111 mg/dL — ABNORMAL HIGH (ref 70–99)

## 2013-08-13 MED ORDER — SODIUM CHLORIDE 0.9 % IV SOLN: 500.0000 mL | INTRAVENOUS | Status: DC

## 2013-08-13 NOTE — Progress Notes (Signed)
A/ox3, pleased with MAC, report to RN 

## 2013-08-13 NOTE — Patient Instructions (Addendum)
The stomach still has polyps - I took biopsies but did not see anything concerning. I will let you know and see you August 5.  I appreciate the opportunity to care for you. Gatha Mayer, MD, FACG  YOU HAD AN ENDOSCOPIC PROCEDURE TODAY AT [facility]: Refer to the procedure report that was given to you for any specific questions about what was found during the examination.  If the procedure report does not answer your questions, please call your gastroenterologist to clarify.  If you requested that your care partner not be given the details of your procedure findings, then the procedure report has been included in a sealed envelope for you to review at your convenience later.  YOU SHOULD EXPECT: Some feelings of bloating in the abdomen. Passage of more gas than usual.  Walking can help get rid of the air that was put into your GI tract during the procedure and reduce the bloating. If you had a lower endoscopy (such as a colonoscopy or flexible sigmoidoscopy) you may notice spotting of blood in your stool or on the toilet paper. If you underwent a bowel prep for your procedure, then you may not have a normal bowel movement for a few days.  DIET: Your first meal following the procedure should be a light meal and then it is ok to progress to your normal diet.  A half-sandwich or bowl of soup is an example of a good first meal.  Heavy or fried foods are harder to digest and may make you feel nauseous or bloated.  Likewise meals heavy in dairy and vegetables can cause extra gas to form and this can also increase the bloating.  Drink plenty of fluids but you should avoid alcoholic beverages for 24 hours.  ACTIVITY: Your care partner should take you home directly after the procedure.  You should plan to take it easy, moving slowly for the rest of the day.  You can resume normal activity the day after the procedure however you should NOT DRIVE or use heavy machinery for 24 hours (because of  the sedation medicines used during the test).    SYMPTOMS TO REPORT IMMEDIATELY: A gastroenterologist can be reached at any hour.  During normal business hours, 8:30 AM to 5:00 PM Monday through Friday, call [phone].  After hours and on weekends, please call the GI answering service at [phone] who will take a message and have the physician on call contact you.   Following lower endoscopy (colonoscopy or flexible sigmoidoscopy):  Excessive amounts of blood in the stool  Significant tenderness or worsening of abdominal pains  Swelling of the abdomen that is new, acute  Fever of 100F or higher  Following upper endoscopy (EGD)  Vomiting of blood or coffee ground material  New chest pain or pain under the shoulder blades  Painful or persistently difficult swallowing  New shortness of breath  Fever of 100F or higher  Black, tarry-looking stools  FOLLOW UP: If any biopsies were taken you will be contacted by phone or by letter within the next 1-3 weeks.  Call your gastroenterologist if you have not heard about the biopsies in 3 weeks.  Our staff will call the home number listed on your records the next business day following your procedure to check on you and address any questions or concerns that you may have at that time regarding the information given to you following your procedure. This is a courtesy call and so if there  is no answer at the home number and we have not heard from you through the emergency physician on call, we will assume that you have returned to your regular daily activities without incident.  SIGNATURES/CONFIDENTIALITY: You and/or your care partner have signed paperwork which will be entered into your electronic medical record.  These signatures attest to the fact that that the information above on your After Visit Summary has been reviewed and is understood.  Full responsibility of the confidentiality of this discharge information lies with you and/or your  care-partner.

## 2013-08-13 NOTE — Op Note (Signed)
[facility]  [address] [place], [id number]   ENDOSCOPY PROCEDURE REPORT  PATIENT: [name], [name]  MR#: [id number] BIRTHDATE: [birth year] , 73  yrs. old GENDER: Female ENDOSCOPIST: Gatha Mayer, MD, [facility] PROCEDURE DATE:  08/13/2013 PROCEDURE:  EGD w/ biopsy ASA CLASS:     Class III INDICATIONS:  Surveillance.  after removal of gastric carcinoid va endoscopy MEDICATIONS: Propofol (Diprivan) 160 mg IV, MAC sedation, administered by CRNA, and These medications were titrated to patient response per physician's verbal order TOPICAL ANESTHETIC: none  DESCRIPTION OF PROCEDURE: After the risks benefits and alternatives of the procedure were thoroughly explained, informed consent was obtained.  The [facility] [id number] endoscope was introduced through the mouth and advanced to the second portion of the duodenum. Without limitations.  The instrument was slowly withdrawn as the mucosa was fully examined.    STOMACH: An innumerable number of polypoid shaped and smooth sessile polyps measuring 2-8 mm in size were found in the cardia, gastric fundus, and gastric body.  Multiple random biopsies was performed using cold forceps.  Sample sent for histology.  The remainder of the upper endoscopy exam was otherwise normal. Retroflexed views revealed as above.     The scope was then withdrawn from the patient and the procedure completed.  COMPLICATIONS: There were no complications. ENDOSCOPIC IMPRESSION: 1.   Innumerable number of sessile polyps measuring 2-8 mm in size were found in the cardia, gastric fundus, and gastric body - all look like konwn fundic gland polyps 2.   The remainder of the upper endoscopy exam was otherwise normal  RECOMMENDATIONS: 1.  Await biopsy results 2.  Will notify and review in person at office visit 09/10/2013   eSigned:  Gatha Mayer, MD, [facility] 08/13/2013 10:53 AM   CC:The Patient  and Rodena Medin, MD

## 2013-08-13 NOTE — Progress Notes (Signed)
Please inform patient that Dr. Alycia Rossetti has reviewed the patient ultrasound and agrees that a repeat ultrasound in 6 months is acceptable.  If patient would like to have intervention for the probable polyp, meaning biopsy or removal, I will send her back to Dr. Alycia Rossetti for an attempted biopsy or hysteroscopy.

## 2013-08-13 NOTE — Progress Notes (Signed)
Called to room to assist during endoscopic procedure.  Patient ID and intended procedure confirmed with present staff. Received instructions for my participation in the procedure from the performing physician.  

## 2013-08-14 ENCOUNTER — Telehealth: Payer: Self-pay

## 2013-08-14 ENCOUNTER — Telehealth: Payer: Self-pay | Admitting: *Deleted

## 2013-08-14 NOTE — Telephone Encounter (Signed)
  Follow up Call-  Call back number 08/13/2013 06/14/2012 05/16/2011  Post procedure Call Back phone  # [phone]  Permission to leave phone message Yes Yes Yes     Patient questions:  Message left to call us if necessary.

## 2013-08-14 NOTE — Telephone Encounter (Signed)
Message copied by Jasmine Awe on Thu Aug 14, 2013  8:48 AM ------      Message from: [place], BROOK E      Created: Wed Aug 13, 2013 10:22 PM                   ----- Message -----         From: Lucita Lora. Alycia Rossetti, MD         Sent: 08/11/2013   3:44 PM           To: Brook E Amundson de Berton Lan, MD            Dear Dietrich Pates      It sounds like things are unchanged and she has not had any further bleeding. If her feelings about not wanting intervention have not changed, I would recommend continued follow up as you are doing and only intervene if increased bleeding, worsening ultrasound, etc.            Thanks      Imagene Gurney      ----- Message -----         From: Jamey Reas de Berton Lan, MD         Sent: 08/07/2013   5:21 PM           To: Lucita Lora. Alycia Rossetti, MD            Please review ultrasound and make any recommendations you may have.             Thanks,            Josefa Half, MD       ------

## 2013-08-14 NOTE — Telephone Encounter (Signed)
Message copied by [place], Augustine Leverette E on Thu Aug 14, 2013  8:50 AM ------      Message from: Silvano Rusk E      Created: Wed Aug 13, 2013  1:50 PM      Regarding: FW: Burtis Junes  - accept a patient       Let her know that Dr. Frederik Pear office will call her at some point to set up a visit      ----- Message -----         From: [name], MD         Sent: 08/13/2013  11:46 AM           To: Gatha Mayer, MD      Subject: RE: Burtis Junes  - accept a patient                            I would be happy to see her. I am heading to [place] this weekend but will have my crew reach out to her to figure out when she needs to be seen unless you think she would prefer to reach out herself.            Erline Levine      ----- Message -----         From: Gatha Mayer, MD         Sent: 08/13/2013  11:01 AM           To: [name], MD      Subject: Burtis Junes  - accept a patient                                Would you be able to add this lady to your panel?            She is a very nice lady - previosuly saw Loanne Drilling - went to [facility] but wants to come back to [facility].            Lives in [place].            Thanks for considering.                  Glendell Docker             ------

## 2013-08-14 NOTE — Telephone Encounter (Signed)
FYI

## 2013-08-14 NOTE — Telephone Encounter (Signed)
Left message to call [place] at [phone].   Brook E Amundson de Berton Lan, MD at 08/13/2013 10:20 PM     Status: Signed        Please inform patient that Dr. Alycia Rossetti has reviewed the patient ultrasound and agrees that a repeat ultrasound in 6 months is acceptable.  If patient would like to have intervention for the probable polyp, meaning biopsy or removal, I will send her back to Dr. Alycia Rossetti for an attempted biopsy or hysteroscopy.

## 2013-08-14 NOTE — Telephone Encounter (Signed)
Spoke with patient. Advised of message as seen below from [place]. Patient would like to have repeat ultrasound in six months unless symptoms worsen. Ultrasound scheduled for 02/12/14 at 11:30am with a 12:00pm consult with Dr.Silva. Patient agreeable to date and time.  Routing to provider for final review. Patient agreeable to disposition. Will close encounter

## 2013-08-14 NOTE — Telephone Encounter (Signed)
Appointment scheduled for 09/11/13 at 7:15am.

## 2013-08-14 NOTE — Telephone Encounter (Signed)
Message copied by Varney Daily on Thu Aug 14, 2013  8:44 AM ------      Message from: Penni Homans A      Created: Wed Aug 13, 2013 11:52 AM      Regarding: FW: Burtis Junes  - accept a patient       This patient needs a PMD, I have agreed to see her. Unless I tell you otherwise please reach out to her in the next couple of weeks and offer her an appt, probably should let the gals up front know as well. Am willing to come in early one day when I get back if that is helpful. Just not my first day back obviously. Thanks.       ----- Message -----         From: Gatha Mayer, MD         Sent: 08/13/2013  11:01 AM           To: [name], MD      Subject: Burtis Junes  - accept a patient                                Would you be able to add this lady to your panel?            She is a very nice lady - previosuly saw Loanne Drilling - went to [facility] but wants to come back to [facility].            Lives in [place].            Thanks for considering.                  Glendell Docker       ------

## 2013-08-14 NOTE — Telephone Encounter (Signed)
Spoke with patient and informed her to expect a call from Dr Frederik Pear office.

## 2013-08-18 ENCOUNTER — Encounter: Payer: Self-pay | Admitting: Internal Medicine

## 2013-08-18 NOTE — Progress Notes (Signed)
Quick Note:  Fundic gland polyps Consider repeat EGD 2016 ______

## 2013-09-01 ENCOUNTER — Telehealth: Payer: Self-pay | Admitting: Cardiology

## 2013-09-01 NOTE — Telephone Encounter (Signed)
New message  Pt called states that the Crestor and Diovan is to expensive. Requests an alternative. Please assist

## 2013-09-02 NOTE — Telephone Encounter (Signed)
Pt called again today

## 2013-09-02 NOTE — Telephone Encounter (Signed)
Forward to DR HOCHREIN/ [facility]  WILL DEFER

## 2013-09-10 ENCOUNTER — Ambulatory Visit (INDEPENDENT_AMBULATORY_CARE_PROVIDER_SITE_OTHER): Payer: Medicare Other | Admitting: Internal Medicine

## 2013-09-10 ENCOUNTER — Encounter: Payer: Self-pay | Admitting: Internal Medicine

## 2013-09-10 VITALS — BP 140/80 | Ht 61.0 in | Wt 174.4 lb

## 2013-09-10 DIAGNOSIS — K589 Irritable bowel syndrome without diarrhea: Secondary | ICD-10-CM | POA: Diagnosis not present

## 2013-09-10 DIAGNOSIS — D3A092 Benign carcinoid tumor of the stomach: Secondary | ICD-10-CM | POA: Diagnosis not present

## 2013-09-10 DIAGNOSIS — G459 Transient cerebral ischemic attack, unspecified: Secondary | ICD-10-CM

## 2013-09-10 DIAGNOSIS — D131 Benign neoplasm of stomach: Secondary | ICD-10-CM | POA: Diagnosis not present

## 2013-09-10 DIAGNOSIS — K3184 Gastroparesis: Secondary | ICD-10-CM

## 2013-09-10 MED ORDER — DICYCLOMINE HCL 10 MG PO CAPS: 10.0000 mg | ORAL_CAPSULE | Freq: Four times a day (QID) | ORAL | Status: DC | PRN

## 2013-09-10 NOTE — Progress Notes (Signed)
   Subjective:    [name], female    DOB: [birth year], 73 y.o.   MRN: [id number]  HPI  Having AM upper abdominal pain relieved by defecation. Gas and loose stools - on metformin 1000 mg bid Wants to use omeprazole - has some at home, cheaper than Nexium and in donut hole  Medications, allergies, past medical history, past surgical history, family history and social history are reviewed and updated in the EMR.   Review of Systems srtill grieving after husband's death    Objective:   Physical Exam WDWN NAD abd soft and non-tender, BS + no splash     Assessment & Plan:  IBS (irritable bowel syndrome) Refill dicyclomine and take every night and prn   Gastroparesis Stable No signs tardive dyskinesia  FUNDIC GLAND POLYPS OF STOMACH Persist No neuroendocrine tumors seen/found Consider repeat EGD 1 year  Carcinoid tumor of stomach- history of Consider EGD 1 year   She will try omeprazole 40 mg (has some of husbands) will call for rx prn  RTC 6 months

## 2013-09-10 NOTE — Assessment & Plan Note (Signed)
Stable No signs tardive dyskinesia

## 2013-09-10 NOTE — Assessment & Plan Note (Signed)
Persist No neuroendocrine tumors seen/found Consider repeat EGD 1 year

## 2013-09-10 NOTE — Assessment & Plan Note (Signed)
Consider EGD 1 year

## 2013-09-10 NOTE — Assessment & Plan Note (Signed)
Refill dicyclomine and take every night and prn

## 2013-09-10 NOTE — Patient Instructions (Addendum)
Today you have been given a printed rx for dicyclomine to take to the pharmacy.  TAKE ONE AT BEDTIME DAILY AND OTHER WISE AS NEEDED.   Let us know if the omeprazole is helping and if you need an rx.  Today you have been given a gastroparesis diet handout to read and follow step 3.   Follow up with [place] in 6 months.  We will send you a reminder.   I appreciate the opportunity to care for you.

## 2013-09-11 ENCOUNTER — Ambulatory Visit (INDEPENDENT_AMBULATORY_CARE_PROVIDER_SITE_OTHER): Payer: Medicare Other | Admitting: Family Medicine

## 2013-09-11 ENCOUNTER — Encounter: Payer: Self-pay | Admitting: Family Medicine

## 2013-09-11 VITALS — BP 126/60 | HR 63 | Temp 98.7°F | Ht 61.0 in | Wt 174.1 lb

## 2013-09-11 DIAGNOSIS — G459 Transient cerebral ischemic attack, unspecified: Secondary | ICD-10-CM | POA: Diagnosis not present

## 2013-09-11 DIAGNOSIS — E119 Type 2 diabetes mellitus without complications: Secondary | ICD-10-CM

## 2013-09-11 DIAGNOSIS — E7800 Pure hypercholesterolemia, unspecified: Secondary | ICD-10-CM | POA: Diagnosis not present

## 2013-09-11 DIAGNOSIS — Z23 Encounter for immunization: Secondary | ICD-10-CM

## 2013-09-11 DIAGNOSIS — D131 Benign neoplasm of stomach: Secondary | ICD-10-CM | POA: Diagnosis not present

## 2013-09-11 DIAGNOSIS — I10 Essential (primary) hypertension: Secondary | ICD-10-CM

## 2013-09-11 DIAGNOSIS — E876 Hypokalemia: Secondary | ICD-10-CM

## 2013-09-11 DIAGNOSIS — I498 Other specified cardiac arrhythmias: Secondary | ICD-10-CM

## 2013-09-11 DIAGNOSIS — M545 Low back pain, unspecified: Secondary | ICD-10-CM

## 2013-09-11 DIAGNOSIS — M722 Plantar fascial fibromatosis: Secondary | ICD-10-CM

## 2013-09-11 DIAGNOSIS — R6889 Other general symptoms and signs: Secondary | ICD-10-CM

## 2013-09-11 DIAGNOSIS — Z0000 Encounter for general adult medical examination without abnormal findings: Secondary | ICD-10-CM

## 2013-09-11 DIAGNOSIS — D509 Iron deficiency anemia, unspecified: Secondary | ICD-10-CM | POA: Insufficient documentation

## 2013-09-11 DIAGNOSIS — Z0001 Encounter for general adult medical examination with abnormal findings: Secondary | ICD-10-CM

## 2013-09-11 DIAGNOSIS — K219 Gastro-esophageal reflux disease without esophagitis: Secondary | ICD-10-CM

## 2013-09-11 MED ORDER — POTASSIUM CHLORIDE CRYS ER 10 MEQ PO TBCR: 10.0000 meq | EXTENDED_RELEASE_TABLET | Freq: Every day | ORAL | Status: DC

## 2013-09-11 NOTE — Assessment & Plan Note (Signed)
Tolerating statin, encouraged heart healthy diet, avoid trans fats, minimize simple carbs and saturated fats. Increase exercise as tolerated 

## 2013-09-11 NOTE — Assessment & Plan Note (Addendum)
minimize simple carbs. Increase exercise as tolerated. Continue current meds. Request records from previous endocrinologist. Repeat hgba1c prior to next visit

## 2013-09-11 NOTE — Assessment & Plan Note (Signed)
Follows with Dr Carlean Purl Excised, followed with endoscopy every 6 months

## 2013-09-11 NOTE — Progress Notes (Signed)
Pre visit review using our clinic review tool, if applicable. No additional management support is needed unless otherwise documented below in the visit note. 

## 2013-09-11 NOTE — Assessment & Plan Note (Signed)
Left foot worse  Dr Doreen Beam podiatry, doing well at present

## 2013-09-11 NOTE — Patient Instructions (Addendum)
Start a probiotic such as Digestive Advantage or [place] Try Sal[place] patches for back pain Try Zyrtec/Cetirizine 10 mg daily for allergies    Basic Carbohydrate Counting for Diabetes Mellitus Carbohydrate counting is a method for keeping track of the amount of carbohydrates you eat. Eating carbohydrates naturally increases the level of sugar (glucose) in your blood, so it is important for you to know the amount that is okay for you to have in every meal. Carbohydrate counting helps keep the level of glucose in your blood within normal limits. The amount of carbohydrates allowed is different for every person. A dietitian can help you calculate the amount that is right for you. Once you know the amount of carbohydrates you can have, you can count the carbohydrates in the foods you want to eat. Carbohydrates are found in the following foods:  Grains, such as breads and cereals.  Dried beans and soy products.  Starchy vegetables, such as potatoes, peas, and corn.  Fruit and fruit juices.  Milk and yogurt.  Sweets and snack foods, such as cake, cookies, candy, chips, soft drinks, and fruit drinks. CARBOHYDRATE COUNTING There are two ways to count the carbohydrates in your food. You can use either of the methods or a combination of both. Reading the "Nutrition Facts" on [place] The "Nutrition Facts" is an area that is included on the labels of almost all packaged food and beverages in the [place]. It includes the serving size of that food or beverage and information about the nutrients in each serving of the food, including the grams (g) of carbohydrate per serving.  Decide the number of servings of this food or beverage that you will be able to eat or drink. Multiply that number of servings by the number of grams of carbohydrate that is listed on the label for that serving. The total will be the amount of carbohydrates you will be having when you eat or drink this food  or beverage. Learning Standard Serving Sizes of Food When you eat food that is not packaged or does not include "Nutrition Facts" on the label, you need to measure the servings in order to count the amount of carbohydrates.A serving of most carbohydrate-rich foods contains about 15 g of carbohydrates. The following list includes serving sizes of carbohydrate-rich foods that provide 15 g ofcarbohydrate per serving:   1 slice of bread (1 oz) or 1 six-inch tortilla.    of a hamburger bun or English muffin.  4-6 crackers.   cup unsweetened dry cereal.    cup hot cereal.   cup rice or pasta.    cup mashed potatoes or  of a large baked potato.  1 cup fresh fruit or one small piece of fruit.    cup canned or frozen fruit or fruit juice.  1 cup milk.   cup plain fat-free yogurt or yogurt sweetened with artificial sweeteners.   cup cooked dried beans or starchy vegetable, such as peas, corn, or potatoes.  Decide the number of standard-size servings that you will eat. Multiply that number of servings by 15 (the grams of carbohydrates in that serving). For example, if you eat 2 cups of strawberries, you will have eaten 2 servings and 30 g of carbohydrates (2 servings x 15 g = 30 g). For foods such as soups and casseroles, in which more than one food is mixed in, you will need to count the carbohydrates in each food that is included. EXAMPLE OF CARBOHYDRATE COUNTING  Sample Dinner  3 oz chicken breast.   cup of brown rice.   cup of corn.  1 cup milk.   1 cup strawberries with sugar-free whipped topping.  Carbohydrate Calculation Step 1: Identify the foods that contain carbohydrates:   Rice.   Corn.   Milk.   Strawberries. Step 2:Calculate the number of servings eaten of each:   2 servings of rice.   1 serving of corn.   1 serving of milk.   1 serving of strawberries. Step 3: Multiply each of those number of servings by 15 g:   2 servings of  rice x 15 g = 30 g.   1 serving of corn x 15 g = 15 g.   1 serving of milk x 15 g = 15 g.   1 serving of strawberries x 15 g = 15 g. Step 4: Add together all of the amounts to find the total grams of carbohydrates eaten: 30 g + 15 g + 15 g + 15 g = 75 g. Document Released: 01/23/2005 Document Revised: 06/09/2013 Document Reviewed: 12/20/2012 [facility] Patient Information 2015 [place], [place]. This information is not intended to replace advice given to you by your health care provider. Make sure you discuss any questions you have with your health care provider.

## 2013-09-13 NOTE — Telephone Encounter (Signed)
Can you take a look at this pt. And suggest something in place of the Diovan and Crestor thanks

## 2013-09-14 ENCOUNTER — Encounter: Payer: Self-pay | Admitting: Family Medicine

## 2013-09-14 DIAGNOSIS — Z0001 Encounter for general adult medical examination with abnormal findings: Secondary | ICD-10-CM | POA: Insufficient documentation

## 2013-09-14 HISTORY — DX: Encounter for general adult medical examination with abnormal findings: Z00.01

## 2013-09-14 NOTE — Assessment & Plan Note (Signed)
RRR today 

## 2013-09-14 NOTE — Progress Notes (Signed)
[name], female   DOB: 12-21-1940, 73 y.o.   MRN: [id number] [name] [id number] 03-Aug-1940 09/14/2013      Progress Note-Follow Up  Subjective  Chief Complaint  Chief Complaint  Patient presents with  . Establish Care    new patient  . Injections    prevnar    HPI  Patient is a 73 year old female in today for routine medical care. She needs to establish care. Her husband recently died and she is struggling with grief. She is eating and functioning but is very sadness. No suicidal ideation but is struggling with anhedonia and agitation at times. Denies CP/palp/SOB/HA/congestion/fevers/GI or GU c/o. Taking meds as prescribed  Past Medical History  Diagnosis Date  . Gastroparesis   . Pure hypercholesterolemia   . Headache(784.0)   . Unspecified essential hypertension   . Esophageal reflux   . Type II or unspecified type diabetes mellitus without mention of complication, not stated as uncontrolled   . Arthritis     Spinal Osteoarthritis  . Diverticulosis 08/30/2000    Colonoscopy   . Gastric polyp     Fundic Gland  . Anxiety   . Cataract   . Heart murmur     Echocardiogram 2/11: EF 60-65%, mild LAE, grade 1 diastolic dysfunction, aortic valve sclerosis, mean gradient 9 mm of mercury, PASP 34  . Chest pain     Myoview 1/11: EF 82%, no ischemia.  . Carcinoid tumor of stomach   . PSVT (paroxysmal supraventricular tachycardia)   . Leg swelling     bilateral  . Chronic kidney disease     Left kidney smaller than right kidney  . Cancer   . PONV (postoperative nausea and vomiting)     pt states only needs small amount of anesthesia  . Stroke     tia, 2014  . Iron deficiency anemia, unspecified   . Encounter for preventative adult health care exam with abnormal findings 09/14/2013    Past Surgical History  Procedure Laterality Date  . Cholecystectomy  1993  . Tonsillectomy    . Colonoscopy  11/11/2010    diverticulosis  .  Esophagogastroduodenoscopy  08/29/2010; 09/15/2010    Carcinoid tumor less than 1 cm in July 2012 not seen in August 2012 , gastritis, fundic gland polyps  . Eus  12/15/2010    Procedure: UPPER ENDOSCOPIC ULTRASOUND (EUS) LINEAR;  Surgeon: Owens Loffler, MD;  Location: [facility];  Service: Endoscopy;  Laterality: N/A;  . Esophagogastroduodenoscopy  05/16/2011  . Esophagogastroduodenoscopy  06/14/2012  . Dilatation & currettage/hysteroscopy with resectocope N/A 02/25/2013    Procedure: Attempted hysteroscopy with uterine perforation;  Surgeon: Jamey Reas de Berton Lan, MD;  Location: [facility];  Service: Gynecology;  Laterality: N/A;  . Laparoscopy N/A 02/25/2013    Procedure: Cystoscopy and laparoscopy with fulguration of uterine serosa;  Surgeon: Jamey Reas de Berton Lan, MD;  Location: [facility];  Service: Gynecology;  Laterality: N/A;    Family History  Problem Relation Age of Onset  . Diabetes Mother   . Stroke Father     deceased age 67  . Heart disease Sister     deceased MI age 49  . Heart disease Brother     deceased MI age 86  . Colon cancer Neg Hx   . Esophageal cancer Neg Hx   . Stomach cancer Neg Hx   . Rectal cancer Neg Hx   . Diabetes Maternal Grandmother   .  Hypertension Paternal Grandmother   . Diabetes Sister   . Heart disease Sister   . Hypertension Sister   . Hyperlipidemia Sister   . Diabetes Sister   . Heart disease Sister   . Hypertension Sister   . Hyperlipidemia Sister   . Diabetes Brother   . Heart disease Brother   . Hypertension Brother   . Hyperlipidemia Brother     History   Social History  . Marital Status: Married    Spouse Name: N/A    Number of Children: 0  . Years of Education: college   Occupational History  . Retail    Social History Main Topics  . Smoking status: Never Smoker   . Smokeless tobacco: Never Used     Comment: Daily Caffeine Use -1  . Alcohol Use: No  . Drug Use: No  . Sexual Activity: No      Comment: lives alone, no dietary restrictions except avoid fresh veg, fruit, whole grains   Other Topics Concern  . Not on file   Social History Narrative   Patient is married ([name]) and lives with her husband.   Patient does not have any children.   Patient is right-handed.   Patient has a BA degree.   One caffeine drink daily     Current Outpatient Prescriptions on File Prior to Visit  Medication Sig Dispense Refill  . acetaminophen (TYLENOL) 500 MG tablet Take 2 tablets (1,000 mg total) by mouth every 6 (six) hours as needed for moderate pain.  30 tablet  0  . ALPRAZolam (XANAX) 0.25 MG tablet Take 1 tablet (0.25 mg total) by mouth 3 (three) times daily as needed for anxiety.  30 tablet  0  . amLODipine (NORVASC) 5 MG tablet Take one tablet by mouth daily.  90 tablet  2  . aspirin 81 MG tablet Take 81 mg by mouth daily.      . cholecalciferol (VITAMIN D) 1000 UNITS tablet Take 1,000 Units by mouth daily.        Marland Kitchen dicyclomine (BENTYL) 10 MG capsule Take 1 capsule (10 mg total) by mouth every 6 (six) hours as needed (abdominal pain- may take 2 capsules if 1 ineffective).  90 capsule  5  . esomeprazole (NEXIUM) 40 MG capsule Take 1 capsule (40 mg total) by mouth daily before breakfast.  90 capsule  3  . folic acid-pyridoxine-cyancobalamin (FOLBIC) 2.5-25-2 MG TABS Take 1 tablet by mouth daily.  90 tablet  1  . metFORMIN (GLUCOPHAGE-XR) 500 MG 24 hr tablet Take 2 tablets (1,000 mg total) by mouth 2 (two) times daily.  180 tablet  1  . metoCLOPramide (REGLAN) 5 MG tablet Take 5 mg by mouth 2 (two) times daily.      . metoprolol tartrate (LOPRESSOR) 25 MG tablet Take 1 tablet (25 mg total) by mouth 2 (two) times   daily.  60 tablet  6  . Multiple Vitamin (MULTIVITAMIN) tablet Take 1 tablet by mouth daily.        . ondansetron (ZOFRAN) 4 MG tablet Take 4 mg by mouth every 4 (four) hours as needed. For nausea        . Propylene Glycol (SYSTANE BALANCE OP) Apply 1 drop to eye 2 (two) times  daily.      . rosuvastatin (CRESTOR) 10 MG tablet Take 1 tablet (10 mg total) by mouth daily.  30 tablet  11  . valsartan (DIOVAN) 320 MG tablet Take 1 tablet (320 mg total) by mouth daily.  30 tablet  4   No current facility-administered medications on file prior to visit.    Allergies  Allergen Reactions  . Tramadol     Dizziness     Review of Systems  Review of Systems  Constitutional: Negative for fever and malaise/fatigue.  HENT: Negative for congestion.   Eyes: Negative for discharge.  Respiratory: Negative for shortness of breath.   Cardiovascular: Negative for chest pain, palpitations and leg swelling.  Gastrointestinal: Negative for nausea, abdominal pain and diarrhea.  Genitourinary: Negative for dysuria.  Musculoskeletal: Negative for falls.  Skin: Negative for rash.  Neurological: Negative for loss of consciousness and headaches.  Endo/Heme/Allergies: Negative for polydipsia.  Psychiatric/Behavioral: Positive for depression. Negative for suicidal ideas. The patient is nervous/anxious. The patient does not have insomnia.   All other systems reviewed and are negative.   Objective  BP 126/60  Pulse 63  Temp(Src) 98.7 F (37.1 C) (Oral)  Ht 5\' 1"  (1.549 m)  Wt 174 lb 1.9 oz (78.98 kg)  BMI 32.92 kg/m2  SpO2 98%  LMP 02/07/1992  Physical Exam  Physical Exam  Constitutional: She is oriented to person, place, and time and well-developed, well-nourished, and in no distress. No distress.  HENT:  Head: Normocephalic and atraumatic.  Eyes: Conjunctivae are normal.  Neck: Neck supple. No thyromegaly present.  Cardiovascular: Normal rate, regular rhythm and normal heart sounds.   No murmur heard. Pulmonary/Chest: Effort normal and breath sounds normal. She has no wheezes.  Abdominal: She exhibits no distension and no mass.  Musculoskeletal: She exhibits no edema.  Lymphadenopathy:    She has no cervical adenopathy.  Neurological: She is alert and oriented to  person, place, and time.  Skin: Skin is warm and dry. No rash noted. She is not diaphoretic.  Psychiatric: Memory, affect and judgment normal.    Lab Results  Component Value Date   TSH 1.050 03/18/2013   Lab Results  Component Value Date   WBC 7.2 03/18/2013   HGB 11.1* 03/18/2013   HCT 34.0* 03/18/2013   MCV 77.1* 03/18/2013   PLT 250 03/18/2013   Lab Results  Component Value Date   CREATININE 0.67 03/18/2013   BUN 17 03/18/2013   NA 138 03/18/2013   K 4.5 03/18/2013   CL 102 03/18/2013   CO2 27 03/18/2013   Lab Results  Component Value Date   ALT 22 03/18/2013   AST 24 03/18/2013   ALKPHOS 68 03/18/2013   BILITOT 0.5 03/18/2013   Lab Results  Component Value Date   CHOL 184 03/18/2013   Lab Results  Component Value Date   HDL 72 03/18/2013   Lab Results  Component Value Date   LDLCALC 90 03/18/2013   Lab Results  Component Value Date   TRIG 111 03/18/2013   Lab Results  Component Value Date   CHOLHDL 2.6 03/18/2013     Assessment & Plan  DIABETES MELLITUS, TYPE II minimize simple carbs. Increase exercise as tolerated. Continue current meds. Request records from previous endocrinologist. Repeat hgba1c prior to next visit  HYPERCHOLESTEROLEMIA Tolerating statin, encouraged heart healthy diet, avoid trans fats, minimize simple carbs and saturated fats. Increase exercise as tolerated  FUNDIC GLAND POLYPS OF STOMACH Follows with Dr Carlean Purl Excised, followed with endoscopy every 6 months  PLANTAR FASCIITIS Left foot worse  Dr Doreen Beam podiatry, doing well at present  HYPERTENSION Well controlled, no changes to meds. Encouraged heart healthy diet such as the DASH diet and exercise as tolerated.   GERD (  gastroesophageal reflux disease) Avoid offending foods, start probiotics. Do not eat large meals in late evening and consider raising head of bed.   SUPRAVENTRICULAR TACHYCARDIA RRR today  Encounter for preventative adult health care exam with abnormal  findings Given Prevnar

## 2013-09-14 NOTE — Assessment & Plan Note (Signed)
Given Prevnar

## 2013-09-14 NOTE — Assessment & Plan Note (Signed)
Well controlled, no changes to meds. Encouraged heart healthy diet such as the DASH diet and exercise as tolerated.  °

## 2013-09-14 NOTE — Assessment & Plan Note (Signed)
Avoid offending foods, start probiotics. Do not eat large meals in late evening and consider raising head of bed.  

## 2013-09-15 NOTE — Telephone Encounter (Signed)
Pt. Stated she already started taking generic diovan and now wants to stay on crestor

## 2013-09-15 NOTE — Telephone Encounter (Signed)
[name] - in his last office note Dr. Percival Spanish suggested losartan 50mg  bid to replace Diovan.  Can try atorvastatin 20 to replace Crestor 10.

## 2013-09-25 ENCOUNTER — Encounter: Payer: Self-pay | Admitting: Physician Assistant

## 2013-09-25 ENCOUNTER — Ambulatory Visit (HOSPITAL_BASED_OUTPATIENT_CLINIC_OR_DEPARTMENT_OTHER)
Admission: RE | Admit: 2013-09-25 | Discharge: 2013-09-25 | Disposition: A | Discharge status: Home / Self Care | Payer: Medicare Other | Source: Ambulatory Visit | Attending: Physician Assistant | Admitting: Physician Assistant

## 2013-09-25 ENCOUNTER — Ambulatory Visit (INDEPENDENT_AMBULATORY_CARE_PROVIDER_SITE_OTHER): Payer: Medicare Other | Admitting: Physician Assistant

## 2013-09-25 VITALS — BP 128/76 | HR 65 | Temp 97.9°F | Resp 16 | Ht 61.0 in | Wt 176.8 lb

## 2013-09-25 DIAGNOSIS — M546 Pain in thoracic spine: Secondary | ICD-10-CM

## 2013-09-25 DIAGNOSIS — R079 Chest pain, unspecified: Secondary | ICD-10-CM | POA: Diagnosis not present

## 2013-09-25 DIAGNOSIS — IMO0002 Reserved for concepts with insufficient information to code with codable children: Secondary | ICD-10-CM | POA: Diagnosis not present

## 2013-09-25 DIAGNOSIS — S298XXA Other specified injuries of thorax, initial encounter: Secondary | ICD-10-CM | POA: Diagnosis not present

## 2013-09-25 NOTE — Patient Instructions (Signed)
Please go downstairs for imaging.  I will call you with your results.  Please take Tylenol every 6 hours for pain.  Apply topical Aspercreme to area of pain.  Avoid heavy lifting. Please call or return to clinic if symptoms are not improving.

## 2013-09-25 NOTE — Progress Notes (Signed)
Pre visit review using our clinic review tool, if applicable. No additional management support is needed unless otherwise documented below in the visit note/SLS  

## 2013-10-06 DIAGNOSIS — M546 Pain in thoracic spine: Secondary | ICD-10-CM | POA: Insufficient documentation

## 2013-10-06 NOTE — Assessment & Plan Note (Signed)
Will obtain x-ray of thoracic spine as well as Left-sided ribs.  Alternate tylenol and ibuprofen. Heating pad.  Topical Aspercreme.  If fracture present will refer to Orthopedic Surgery.

## 2013-10-06 NOTE — Progress Notes (Signed)
Patient presents to clinic today c/o cervical and thoracic back pain and tenderness after a shelf fell on her back on 09/11/13.  Endorses initial bruising that has since resolved.  Denies radiation of pain elsewhere.  Denies decreased ROM.  Past Medical History  Diagnosis Date  . Gastroparesis   . Pure hypercholesterolemia   . Headache(784.0)   . Unspecified essential hypertension   . Esophageal reflux   . Type II or unspecified type diabetes mellitus without mention of complication, not stated as uncontrolled   . Arthritis     Spinal Osteoarthritis  . Diverticulosis 08/30/2000    Colonoscopy   . Gastric polyp     Fundic Gland  . Anxiety   . Cataract   . Heart murmur     Echocardiogram 2/11: EF 60-65%, mild LAE, grade 1 diastolic dysfunction, aortic valve sclerosis, mean gradient 9 mm of mercury, PASP 34  . Chest pain     Myoview 1/11: EF 82%, no ischemia.  . Carcinoid tumor of stomach   . PSVT (paroxysmal supraventricular tachycardia)   . Leg swelling     bilateral  . Chronic kidney disease     Left kidney smaller than right kidney  . Cancer   . PONV (postoperative nausea and vomiting)     pt states only needs small amount of anesthesia  . Stroke     tia, 2014  . Iron deficiency anemia, unspecified   . Encounter for preventative adult health care exam with abnormal findings 09/14/2013    Current Outpatient Prescriptions on File Prior to Visit  Medication Sig Dispense Refill  . acetaminophen (TYLENOL) 500 MG tablet Take 2 tablets (1,000 mg total) by mouth every 6 (six) hours as needed for moderate pain.  30 tablet  0  . ALPRAZolam (XANAX) 0.25 MG tablet Take 1 tablet (0.25 mg total) by mouth 3 (three) times daily as needed for anxiety.  30 tablet  0  . amLODipine (NORVASC) 5 MG tablet Take one tablet by mouth daily.  90 tablet  2  . aspirin 81 MG tablet Take 81 mg by mouth daily.      . cholecalciferol (VITAMIN D) 1000 UNITS tablet Take 1,000 Units by mouth daily.        Marland Kitchen  dicyclomine (BENTYL) 10 MG capsule Take 1 capsule (10 mg total) by mouth every 6 (six) hours as needed (abdominal pain- may take 2 capsules if 1 ineffective).  90 capsule  5  . esomeprazole (NEXIUM) 40 MG capsule Take 1 capsule (40 mg total) by mouth daily before breakfast.  90 capsule  3  . folic acid-pyridoxine-cyancobalamin (FOLBIC) 2.5-25-2 MG TABS Take 1 tablet by mouth daily.  90 tablet  1  . metFORMIN (GLUCOPHAGE-XR) 500 MG 24 hr tablet Take 2 tablets (1,000 mg total) by mouth 2 (two) times daily.  180 tablet  1  . metoCLOPramide (REGLAN) 5 MG tablet Take 5 mg by mouth 2 (two) times daily.      . metoprolol tartrate (LOPRESSOR) 25 MG tablet Take 1 tablet (25 mg total) by mouth 2 (two) times   daily.  60 tablet  6  . Multiple Vitamin (MULTIVITAMIN) tablet Take 1 tablet by mouth daily.        . ondansetron (ZOFRAN) 4 MG tablet Take 4 mg by mouth every 4 (four) hours as needed. For nausea        . potassium chloride (K-DUR,KLOR-CON) 10 MEQ tablet Take 1 tablet (10 mEq total) by mouth daily.  90 tablet  3  . Propylene Glycol (SYSTANE BALANCE OP) Apply 1 drop to eye 2 (two) times daily.      . rosuvastatin (CRESTOR) 10 MG tablet Take 1 tablet (10 mg total) by mouth daily.  30 tablet  11  . valsartan (DIOVAN) 320 MG tablet Take 1 tablet (320 mg total) by mouth daily.  30 tablet  4   No current facility-administered medications on file prior to visit.    Allergies  Allergen Reactions  . Tramadol     Dizziness     Family History  Problem Relation Age of Onset  . Diabetes Mother   . Stroke Father     deceased age 18  . Heart disease Sister     deceased MI age 97  . Heart disease Brother     deceased MI age 36  . Colon cancer Neg Hx   . Esophageal cancer Neg Hx   . Stomach cancer Neg Hx   . Rectal cancer Neg Hx   . Diabetes Maternal Grandmother   . Hypertension Paternal Grandmother   . Diabetes Sister   . Heart disease Sister   . Hypertension Sister   . Hyperlipidemia Sister    . Diabetes Sister   . Heart disease Sister   . Hypertension Sister   . Hyperlipidemia Sister   . Diabetes Brother   . Heart disease Brother   . Hypertension Brother   . Hyperlipidemia Brother     History   Social History  . Marital Status: Married    Spouse Name: N/A    Number of Children: 0  . Years of Education: college   Occupational History  . Retail    Social History Main Topics  . Smoking status: Never Smoker   . Smokeless tobacco: Never Used     Comment: Daily Caffeine Use -1  . Alcohol Use: No  . Drug Use: No  . Sexual Activity: No     Comment: lives alone, no dietary restrictions except avoid fresh veg, fruit, whole grains   Other Topics Concern  . None   Social History Narrative   Patient is married Tourist information centre manager) and lives with her husband.   Patient does not have any children.   Patient is right-handed.   Patient has a BA degree.   One caffeine drink daily     Review of Systems - See HPI.  All other ROS are negative.  BP 128/76  Pulse 65  Temp(Src) 97.9 F (36.6 C) (Oral)  Resp 16  Ht 5\' 1"  (1.549 m)  Wt 176 lb 12 oz (80.173 kg)  BMI 33.41 kg/m2  SpO2 99%  LMP 02/07/1992  Physical Exam  Vitals reviewed. Constitutional: She is oriented to person, place, and time and well-developed, well-nourished, and in no distress.  HENT:  Head: Normocephalic and atraumatic.  Eyes: Conjunctivae are normal.  Cardiovascular: Normal rate, regular rhythm, normal heart sounds and intact distal pulses.   Pulmonary/Chest: Effort normal and breath sounds normal. No respiratory distress. She has no wheezes. She has no rales.  Musculoskeletal:       Cervical back: She exhibits tenderness, bony tenderness and pain. She exhibits normal range of motion, no swelling, no edema and no spasm.       Thoracic back: She exhibits tenderness and pain. She exhibits normal range of motion, no bony tenderness and no swelling.       Lumbar back: Normal.  Neurological: She is alert and  oriented to person, place, and time.  Skin: Skin  is warm and dry. No rash noted.  Psychiatric: Affect normal.   Recent Results (from the past 2160 hour(s))  GLUCOSE, CAPILLARY     Status: Abnormal   Collection Time    08/13/13  9:51 AM      Result Value Ref Range   Glucose-Capillary 111 (*) 70 - 99 mg/dL  GLUCOSE, CAPILLARY     Status: Abnormal   Collection Time    08/13/13 10:53 AM      Result Value Ref Range   Glucose-Capillary 119 (*) 70 - 99 mg/dL    Assessment/Plan: Left-sided thoracic back pain Will obtain x-ray of thoracic spine as well as Left-sided ribs.  Alternate tylenol and ibuprofen. Heating pad.  Topical Aspercreme.  If fracture present will refer to Orthopedic Surgery.

## 2013-10-17 ENCOUNTER — Other Ambulatory Visit (INDEPENDENT_AMBULATORY_CARE_PROVIDER_SITE_OTHER): Payer: Medicare Other

## 2013-10-17 DIAGNOSIS — I10 Essential (primary) hypertension: Secondary | ICD-10-CM | POA: Diagnosis not present

## 2013-10-17 DIAGNOSIS — E1110 Type 2 diabetes mellitus with ketoacidosis without coma: Secondary | ICD-10-CM

## 2013-10-17 DIAGNOSIS — Z79899 Other long term (current) drug therapy: Secondary | ICD-10-CM | POA: Diagnosis not present

## 2013-10-17 DIAGNOSIS — E1310 Other specified diabetes mellitus with ketoacidosis without coma: Secondary | ICD-10-CM | POA: Diagnosis not present

## 2013-10-17 LAB — HEPATIC FUNCTION PANEL
ALBUMIN: 3.9 g/dL (ref 3.5–5.2)
ALT: 27 U/L (ref 0–35)
AST: 32 U/L (ref 0–37)
Alkaline Phosphatase: 61 U/L (ref 39–117)
Bilirubin, Direct: 0 mg/dL (ref 0.0–0.3)
Total Bilirubin: 0.5 mg/dL (ref 0.2–1.2)
Total Protein: 7.6 g/dL (ref 6.0–8.3)

## 2013-10-17 LAB — CBC WITH DIFFERENTIAL/PLATELET
Basophils Absolute: 0 10*3/uL (ref 0.0–0.1)
Basophils Relative: 0.6 % (ref 0.0–3.0)
EOS ABS: 0.1 10*3/uL (ref 0.0–0.7)
Eosinophils Relative: 1.3 % (ref 0.0–5.0)
HCT: 34.6 % — ABNORMAL LOW (ref 36.0–46.0)
Hemoglobin: 11.3 g/dL — ABNORMAL LOW (ref 12.0–15.0)
Lymphocytes Relative: 36.2 % (ref 12.0–46.0)
Lymphs Abs: 2.5 10*3/uL (ref 0.7–4.0)
MCHC: 32.6 g/dL (ref 30.0–36.0)
MCV: 76.9 fl — ABNORMAL LOW (ref 78.0–100.0)
MONO ABS: 0.5 10*3/uL (ref 0.1–1.0)
Monocytes Relative: 7.3 % (ref 3.0–12.0)
NEUTROS PCT: 54.6 % (ref 43.0–77.0)
Neutro Abs: 3.7 10*3/uL (ref 1.4–7.7)
PLATELETS: 262 10*3/uL (ref 150.0–400.0)
RBC: 4.5 Mil/uL (ref 3.87–5.11)
RDW: 15.6 % — ABNORMAL HIGH (ref 11.5–15.5)
WBC: 6.8 10*3/uL (ref 4.0–10.5)

## 2013-10-17 LAB — LIPID PANEL
CHOLESTEROL: 195 mg/dL (ref 0–200)
HDL: 74.1 mg/dL (ref >39.00)
LDL Cholesterol: 95 mg/dL (ref 0–99)
NonHDL: 120.9
Total CHOL/HDL Ratio: 3
Triglycerides: 129 mg/dL (ref 0.0–149.0)
VLDL: 25.8 mg/dL (ref 0.0–40.0)

## 2013-10-17 LAB — RENAL FUNCTION PANEL
Albumin: 3.9 g/dL (ref 3.5–5.2)
BUN: 14 mg/dL (ref 6–23)
CALCIUM: 9.4 mg/dL (ref 8.4–10.5)
CO2: 25 mEq/L (ref 19–32)
CREATININE: 0.7 mg/dL (ref 0.4–1.2)
Chloride: 106 mEq/L (ref 96–112)
GFR: 87.29 mL/min (ref >60.00)
Glucose, Bld: 118 mg/dL — ABNORMAL HIGH (ref 70–99)
Phosphorus: 3.2 mg/dL (ref 2.3–4.6)
Potassium: 4.1 mEq/L (ref 3.5–5.1)
Sodium: 140 mEq/L (ref 135–145)

## 2013-10-17 LAB — HEMOGLOBIN A1C: HEMOGLOBIN A1C: 6.9 % — AB (ref 4.6–6.5)

## 2013-10-17 LAB — TSH: TSH: 0.79 u[IU]/mL (ref 0.35–4.50)

## 2013-10-20 ENCOUNTER — Ambulatory Visit (INDEPENDENT_AMBULATORY_CARE_PROVIDER_SITE_OTHER): Payer: Medicare Other | Admitting: Family Medicine

## 2013-10-20 ENCOUNTER — Ambulatory Visit (HOSPITAL_BASED_OUTPATIENT_CLINIC_OR_DEPARTMENT_OTHER)
Admission: RE | Admit: 2013-10-20 | Discharge: 2013-10-20 | Disposition: A | Discharge status: Home / Self Care | Payer: Medicare Other | Source: Ambulatory Visit | Attending: Family Medicine | Admitting: Family Medicine

## 2013-10-20 ENCOUNTER — Encounter: Payer: Self-pay | Admitting: Family Medicine

## 2013-10-20 VITALS — BP 124/64 | HR 71 | Temp 97.9°F | Ht 61.0 in | Wt 176.4 lb

## 2013-10-20 DIAGNOSIS — M545 Low back pain, unspecified: Secondary | ICD-10-CM

## 2013-10-20 DIAGNOSIS — I498 Other specified cardiac arrhythmias: Secondary | ICD-10-CM

## 2013-10-20 DIAGNOSIS — D509 Iron deficiency anemia, unspecified: Secondary | ICD-10-CM

## 2013-10-20 DIAGNOSIS — E119 Type 2 diabetes mellitus without complications: Secondary | ICD-10-CM

## 2013-10-20 DIAGNOSIS — K589 Irritable bowel syndrome without diarrhea: Secondary | ICD-10-CM

## 2013-10-20 DIAGNOSIS — I6529 Occlusion and stenosis of unspecified carotid artery: Secondary | ICD-10-CM

## 2013-10-20 DIAGNOSIS — I10 Essential (primary) hypertension: Secondary | ICD-10-CM

## 2013-10-20 DIAGNOSIS — M542 Cervicalgia: Secondary | ICD-10-CM | POA: Insufficient documentation

## 2013-10-20 DIAGNOSIS — G609 Hereditary and idiopathic neuropathy, unspecified: Secondary | ICD-10-CM

## 2013-10-20 DIAGNOSIS — Z23 Encounter for immunization: Secondary | ICD-10-CM

## 2013-10-20 DIAGNOSIS — M79609 Pain in unspecified limb: Secondary | ICD-10-CM | POA: Diagnosis not present

## 2013-10-20 NOTE — Assessment & Plan Note (Signed)
RRR today 

## 2013-10-20 NOTE — Progress Notes (Signed)
Pre visit review using our clinic review tool, if applicable. No additional management support is needed unless otherwise documented below in the visit note. 

## 2013-10-20 NOTE — Assessment & Plan Note (Signed)
hgba1c acceptable, minimize simple carbs. Increase exercise as tolerated. Continue current meds 

## 2013-10-20 NOTE — Assessment & Plan Note (Signed)
Encouraged increased iron in diet and continue to monitor

## 2013-10-20 NOTE — Assessment & Plan Note (Signed)
Improved on recheck, Well controlled, no changes to meds. Encouraged heart healthy diet such as the DASH diet and exercise as tolerated.  

## 2013-10-20 NOTE — Patient Instructions (Signed)
[place] patches for back  Back Pain, Adult Low back pain is very common. About 1 in 5 people have back pain.The cause of low back pain is rarely dangerous. The pain often gets better over time.About half of people with a sudden onset of back pain feel better in just 2 weeks. About 8 in 10 people feel better by 6 weeks.  CAUSES Some common causes of back pain include:  Strain of the muscles or ligaments supporting the spine.  Wear and tear (degeneration) of the spinal discs.  Arthritis.  Direct injury to the back. DIAGNOSIS Most of the time, the direct cause of low back pain is not known.However, back pain can be treated effectively even when the exact cause of the pain is unknown.Answering your caregiver's questions about your overall health and symptoms is one of the most accurate ways to make sure the cause of your pain is not dangerous. If your caregiver needs more information, he or she may order lab work or imaging tests (X-rays or MRIs).However, even if imaging tests show changes in your back, this usually does not require surgery. HOME CARE INSTRUCTIONS For many people, back pain returns.Since low back pain is rarely dangerous, it is often a condition that people can learn to [facility] their own.   Remain active. It is stressful on the back to sit or stand in one place. Do not sit, drive, or stand in one place for more than 30 minutes at a time. Take short walks on level surfaces as soon as pain allows.Try to increase the length of time you walk each day.  Do not stay in bed.Resting more than 1 or 2 days can delay your recovery.  Do not avoid exercise or work.Your body is made to move.It is not dangerous to be active, even though your back may hurt.Your back will likely heal faster if you return to being active before your pain is gone.  Pay attention to your body when you bend and lift. Many people have less discomfortwhen lifting if they bend their knees, keep the  load close to their bodies,and avoid twisting. Often, the most comfortable positions are those that put less stress on your recovering back.  Find a comfortable position to sleep. Use a firm mattress and lie on your side with your knees slightly bent. If you lie on your back, put a pillow under your knees.  Only take over-the-counter or prescription medicines as directed by your caregiver. Over-the-counter medicines to reduce pain and inflammation are often the most helpful.Your caregiver may prescribe muscle relaxant drugs.These medicines help dull your pain so you can more quickly return to your normal activities and healthy exercise.  Put ice on the injured area.  Put ice in a plastic bag.  Place a towel between your skin and the bag.  Leave the ice on for 15-20 minutes, 03-04 times a day for the first 2 to 3 days. After that, ice and heat may be alternated to reduce pain and spasms.  Ask your caregiver about trying back exercises and gentle massage. This may be of some benefit.  Avoid feeling anxious or stressed.Stress increases muscle tension and can worsen back pain.It is important to recognize when you are anxious or stressed and learn ways to manage it.Exercise is a great option. SEEK MEDICAL CARE IF:  You have pain that is not relieved with rest or medicine.  You have pain that does not improve in 1 week.  You have new symptoms.  You  are generally not feeling well. SEEK IMMEDIATE MEDICAL CARE IF:   You have pain that radiates from your back into your legs.  You develop new bowel or bladder control problems.  You have unusual weakness or numbness in your arms or legs.  You develop nausea or vomiting.  You develop abdominal pain.  You feel faint. Document Released: 01/23/2005 Document Revised: 07/25/2011 Document Reviewed: 05/27/2013 [facility] Patient Information 2015 [place], [place]. This information is not intended to replace advice given to you by your health  care provider. Make sure you discuss any questions you have with your health care provider.

## 2013-10-23 ENCOUNTER — Telehealth: Payer: Self-pay | Admitting: *Deleted

## 2013-10-23 NOTE — Telephone Encounter (Signed)
Received [employer] paperwork via fax from [facility].   LMOM on (10/22/13) @ (5:00pm @ [phone]) asking the pt to RTC regarding form.//AB/CMA

## 2013-10-23 NOTE — Telephone Encounter (Signed)
Pt called back.  [phone].

## 2013-10-24 ENCOUNTER — Other Ambulatory Visit (HOSPITAL_COMMUNITY): Payer: Self-pay | Admitting: *Deleted

## 2013-10-24 DIAGNOSIS — I6529 Occlusion and stenosis of unspecified carotid artery: Secondary | ICD-10-CM

## 2013-10-24 NOTE — Telephone Encounter (Signed)
Patient returning call [phone]

## 2013-10-28 DIAGNOSIS — E119 Type 2 diabetes mellitus without complications: Secondary | ICD-10-CM | POA: Diagnosis not present

## 2013-10-28 DIAGNOSIS — G609 Hereditary and idiopathic neuropathy, unspecified: Secondary | ICD-10-CM | POA: Diagnosis not present

## 2013-10-28 DIAGNOSIS — M7989 Other specified soft tissue disorders: Secondary | ICD-10-CM | POA: Diagnosis not present

## 2013-10-29 ENCOUNTER — Ambulatory Visit: Payer: Medicare Other | Admitting: Physical Therapy

## 2013-10-29 ENCOUNTER — Encounter: Payer: Self-pay | Admitting: Family Medicine

## 2013-10-29 DIAGNOSIS — G609 Hereditary and idiopathic neuropathy, unspecified: Secondary | ICD-10-CM

## 2013-10-29 DIAGNOSIS — E1142 Type 2 diabetes mellitus with diabetic polyneuropathy: Secondary | ICD-10-CM

## 2013-10-29 DIAGNOSIS — M542 Cervicalgia: Secondary | ICD-10-CM | POA: Insufficient documentation

## 2013-10-29 HISTORY — DX: Type 2 diabetes mellitus with diabetic polyneuropathy: E11.42

## 2013-10-29 HISTORY — DX: Hereditary and idiopathic neuropathy, unspecified: G60.9

## 2013-10-29 NOTE — Assessment & Plan Note (Signed)
Has been diabetic roughly 15 years and notes increased sensation of needles in legs, discussed options including meds but she declines at this time.

## 2013-10-29 NOTE — Progress Notes (Signed)
[name], female   DOB: 1940-12-15, 73 y.o.   MRN: [id number] DISHA COTTAM [id number] February 09, 1940 10/29/2013      Progress Note-Follow Up  Subjective  Chief Complaint  Chief Complaint  Patient presents with  . Follow-up    6 week  . Injections    flu    HPI  Patient is a 73 year old female in today for routine medical care. She is generally doing well she does knowledge increased tingling and needle sensation in her legs. No recent injury or fall she does continue to struggle with low back pain as well as thoracic back pain. Has more pain on the left than the right. No recent illness. No polyuria or polydipsia. Denies CP/palp/SOB/HA/congestion/fevers/GI or GU c/o. Taking meds as prescribed  Past Medical History  Diagnosis Date  . Gastroparesis   . Pure hypercholesterolemia   . Headache(784.0)   . Unspecified essential hypertension   . Esophageal reflux   . Type II or unspecified type diabetes mellitus without mention of complication, not stated as uncontrolled   . Arthritis     Spinal Osteoarthritis  . Diverticulosis 08/30/2000    Colonoscopy   . Gastric polyp     Fundic Gland  . Anxiety   . Cataract   . Heart murmur     Echocardiogram 2/11: EF 60-65%, mild LAE, grade 1 diastolic dysfunction, aortic valve sclerosis, mean gradient 9 mm of mercury, PASP 34  . Chest pain     Myoview 1/11: EF 82%, no ischemia.  . Carcinoid tumor of stomach   . PSVT (paroxysmal supraventricular tachycardia)   . Leg swelling     bilateral  . Chronic kidney disease     Left kidney smaller than right kidney  . Cancer   . PONV (postoperative nausea and vomiting)     pt states only needs small amount of anesthesia  . Stroke     tia, 2014  . Iron deficiency anemia, unspecified   . Encounter for preventative adult health care exam with abnormal findings 09/14/2013  . Unspecified hereditary and idiopathic peripheral neuropathy 10/29/2013  . Diabetic peripheral  neuropathy 10/29/2013    Past Surgical History  Procedure Laterality Date  . Cholecystectomy  1993  . Tonsillectomy    . Colonoscopy  11/11/2010    diverticulosis  . Esophagogastroduodenoscopy  08/29/2010; 09/15/2010    Carcinoid tumor less than 1 cm in July 2012 not seen in August 2012 , gastritis, fundic gland polyps  . Eus  12/15/2010    Procedure: UPPER ENDOSCOPIC ULTRASOUND (EUS) LINEAR;  Surgeon: Owens Loffler, MD;  Location: [facility];  Service: Endoscopy;  Laterality: N/A;  . Esophagogastroduodenoscopy  05/16/2011  . Esophagogastroduodenoscopy  06/14/2012  . Dilatation & currettage/hysteroscopy with resectocope N/A 02/25/2013    Procedure: Attempted hysteroscopy with uterine perforation;  Surgeon: Jamey Reas de Berton Lan, MD;  Location: [facility];  Service: Gynecology;  Laterality: N/A;  . Laparoscopy N/A 02/25/2013    Procedure: Cystoscopy and laparoscopy with fulguration of uterine serosa;  Surgeon: Jamey Reas de Berton Lan, MD;  Location: [facility];  Service: Gynecology;  Laterality: N/A;    Family History  Problem Relation Age of Onset  . Diabetes Mother   . Stroke Father     deceased age 2  . Heart disease Sister     deceased MI age 9  . Heart disease Brother     deceased MI age 58  . Colon cancer Neg Hx   .  Esophageal cancer Neg Hx   . Stomach cancer Neg Hx   . Rectal cancer Neg Hx   . Diabetes Maternal Grandmother   . Hypertension Paternal Grandmother   . Diabetes Sister   . Heart disease Sister   . Hypertension Sister   . Hyperlipidemia Sister   . Diabetes Sister   . Heart disease Sister   . Hypertension Sister   . Hyperlipidemia Sister   . Diabetes Brother   . Heart disease Brother   . Hypertension Brother   . Hyperlipidemia Brother     History   Social History  . Marital Status: Married    Spouse Name: N/A    Number of Children: 0  . Years of Education: college   Occupational History  . Retail    Social History Main Topics  .  Smoking status: Never Smoker   . Smokeless tobacco: Never Used     Comment: Daily Caffeine Use -1  . Alcohol Use: No  . Drug Use: No  . Sexual Activity: No     Comment: lives alone, no dietary restrictions except avoid fresh veg, fruit, whole grains   Other Topics Concern  . Not on file   Social History Narrative   Patient is married ([name]) and lives with her husband.   Patient does not have any children.   Patient is right-handed.   Patient has a BA degree.   One caffeine drink daily     Current Outpatient Prescriptions on File Prior to Visit  Medication Sig Dispense Refill  . acetaminophen (TYLENOL) 500 MG tablet Take 2 tablets (1,000 mg total) by mouth every 6 (six) hours as needed for moderate pain.  30 tablet  0  . ALPRAZolam (XANAX) 0.25 MG tablet Take 1 tablet (0.25 mg total) by mouth 3 (three) times daily as needed for anxiety.  30 tablet  0  . amLODipine (NORVASC) 5 MG tablet Take one tablet by mouth daily.  90 tablet  2  . aspirin 81 MG tablet Take 81 mg by mouth daily.      . cholecalciferol (VITAMIN D) 1000 UNITS tablet Take 1,000 Units by mouth daily.        Marland Kitchen dicyclomine (BENTYL) 10 MG capsule Take 1 capsule (10 mg total) by mouth every 6 (six) hours as needed (abdominal pain- may take 2 capsules if 1 ineffective).  90 capsule  5  . esomeprazole (NEXIUM) 40 MG capsule Take 1 capsule (40 mg total) by mouth daily before breakfast.  90 capsule  3  . folic acid-pyridoxine-cyancobalamin (FOLBIC) 2.5-25-2 MG TABS Take 1 tablet by mouth daily.  90 tablet  1  . metFORMIN (GLUCOPHAGE-XR) 500 MG 24 hr tablet Take 2 tablets (1,000 mg total) by mouth 2 (two) times daily.  180 tablet  1  . metoCLOPramide (REGLAN) 5 MG tablet Take 5 mg by mouth 2 (two) times daily.      . metoprolol tartrate (LOPRESSOR) 25 MG tablet Take 1 tablet (25 mg total) by mouth 2 (two) times   daily.  60 tablet  6  . Multiple Vitamin (MULTIVITAMIN) tablet Take 1 tablet by mouth daily.        . ondansetron  (ZOFRAN) 4 MG tablet Take 4 mg by mouth every 4 (four) hours as needed. For nausea        . potassium chloride (K-DUR,KLOR-CON) 10 MEQ tablet Take 1 tablet (10 mEq total) by mouth daily.  90 tablet  3  . Propylene Glycol (SYSTANE BALANCE OP) Apply  1 drop to eye 2 (two) times daily.      . rosuvastatin (CRESTOR) 10 MG tablet Take 1 tablet (10 mg total) by mouth daily.  30 tablet  11  . valsartan (DIOVAN) 320 MG tablet Take 1 tablet (320 mg total) by mouth daily.  30 tablet  4   No current facility-administered medications on file prior to visit.    Allergies  Allergen Reactions  . Tramadol     Dizziness     Review of Systems  Review of Systems  Constitutional: Negative for fever and malaise/fatigue.  HENT: Negative for congestion.   Eyes: Negative for discharge.  Respiratory: Negative for shortness of breath.   Cardiovascular: Negative for chest pain, palpitations and leg swelling.  Gastrointestinal: Positive for diarrhea and constipation. Negative for nausea and abdominal pain.  Genitourinary: Negative for dysuria.  Musculoskeletal: Negative for falls.  Skin: Negative for rash.  Neurological: Positive for tingling. Negative for loss of consciousness and headaches.  Endo/Heme/Allergies: Negative for polydipsia.  Psychiatric/Behavioral: Negative for depression and suicidal ideas. The patient is not nervous/anxious and does not have insomnia.     Objective  BP 124/64  Pulse 71  Temp(Src) 97.9 F (36.6 C) (Oral)  Ht 5\' 1"  (1.549 m)  Wt 176 lb 6.4 oz (80.015 kg)  BMI 33.35 kg/m2  SpO2 100%  LMP 02/07/1992  Physical Exam  Physical Exam  Constitutional: She is oriented to person, place, and time and well-developed, well-nourished, and in no distress. No distress.  HENT:  Head: Normocephalic and atraumatic.  Eyes: Conjunctivae are normal.  Neck: Neck supple. No thyromegaly present.  Cardiovascular: Normal rate, regular rhythm and normal heart sounds.   No murmur  heard. Pulmonary/Chest: Effort normal and breath sounds normal. She has no wheezes.  Abdominal: She exhibits no distension and no mass.  Musculoskeletal: She exhibits no edema.  Lymphadenopathy:    She has no cervical adenopathy.  Neurological: She is alert and oriented to person, place, and time.  Skin: Skin is warm and dry. No rash noted. She is not diaphoretic.  Psychiatric: Memory, affect and judgment normal.    Lab Results  Component Value Date   TSH 0.79 10/17/2013   Lab Results  Component Value Date   WBC 6.8 10/17/2013   HGB 11.3* 10/17/2013   HCT 34.6* 10/17/2013   MCV 76.9* 10/17/2013   PLT 262.0 10/17/2013   Lab Results  Component Value Date   CREATININE 0.7 10/17/2013   BUN 14 10/17/2013   NA 140 10/17/2013   K 4.1 10/17/2013   CL 106 10/17/2013   CO2 25 10/17/2013   Lab Results  Component Value Date   ALT 27 10/17/2013   AST 32 10/17/2013   ALKPHOS 61 10/17/2013   BILITOT 0.5 10/17/2013   Lab Results  Component Value Date   CHOL 195 10/17/2013   Lab Results  Component Value Date   HDL 74.10 10/17/2013   Lab Results  Component Value Date   LDLCALC 95 10/17/2013   Lab Results  Component Value Date   TRIG 129.0 10/17/2013   Lab Results  Component Value Date   CHOLHDL 3 10/17/2013     Assessment & Plan  DIABETES MELLITUS, TYPE II hgba1c acceptable, minimize simple carbs. Increase exercise as tolerated. Continue current meds  HYPERTENSION Improved on recheck, Well controlled, no changes to meds. Encouraged heart healthy diet such as the DASH diet and exercise as tolerated.   Iron deficiency anemia, unspecified Encouraged increased iron in diet and continue  to monitor  SUPRAVENTRICULAR TACHYCARDIA RRR today  IBS (irritable bowel syndrome) Restart Metamucil and increase hydration.  Diabetic peripheral neuropathy Has been diabetic roughly 15 years and notes increased sensation of needles in legs, discussed options including meds but she declines at this  time.  Low back pain Referred for physical therapy which has helped her in the past. May try moist heat and topical treatments prn

## 2013-10-29 NOTE — Assessment & Plan Note (Signed)
Referred for physical therapy which has helped her in the past. May try moist heat and topical treatments prn

## 2013-10-29 NOTE — Assessment & Plan Note (Signed)
Restart Metamucil and increase hydration.

## 2013-10-30 ENCOUNTER — Other Ambulatory Visit: Payer: Self-pay | Admitting: *Deleted

## 2013-10-30 ENCOUNTER — Telehealth: Payer: Self-pay | Admitting: *Deleted

## 2013-10-30 MED ORDER — VALSARTAN 320 MG PO TABS: 320.0000 mg | ORAL_TABLET | Freq: Every day | ORAL | Status: DC

## 2013-10-30 NOTE — Telephone Encounter (Signed)
Refill

## 2013-11-03 ENCOUNTER — Ambulatory Visit (HOSPITAL_COMMUNITY): Payer: Medicare Other | Attending: Internal Medicine | Admitting: Cardiology

## 2013-11-03 DIAGNOSIS — E785 Hyperlipidemia, unspecified: Secondary | ICD-10-CM | POA: Diagnosis not present

## 2013-11-03 DIAGNOSIS — Z8673 Personal history of transient ischemic attack (TIA), and cerebral infarction without residual deficits: Secondary | ICD-10-CM | POA: Insufficient documentation

## 2013-11-03 DIAGNOSIS — E119 Type 2 diabetes mellitus without complications: Secondary | ICD-10-CM | POA: Diagnosis not present

## 2013-11-03 DIAGNOSIS — I6529 Occlusion and stenosis of unspecified carotid artery: Secondary | ICD-10-CM | POA: Insufficient documentation

## 2013-11-03 DIAGNOSIS — I10 Essential (primary) hypertension: Secondary | ICD-10-CM | POA: Insufficient documentation

## 2013-11-03 NOTE — Progress Notes (Signed)
Carotid duplex performed 

## 2013-11-04 ENCOUNTER — Ambulatory Visit: Payer: Medicare Other | Attending: Family Medicine | Admitting: Physical Therapy

## 2013-11-04 DIAGNOSIS — M545 Low back pain, unspecified: Secondary | ICD-10-CM | POA: Diagnosis not present

## 2013-11-04 DIAGNOSIS — M542 Cervicalgia: Secondary | ICD-10-CM | POA: Diagnosis not present

## 2013-11-04 DIAGNOSIS — IMO0001 Reserved for inherently not codable concepts without codable children: Secondary | ICD-10-CM | POA: Insufficient documentation

## 2013-11-05 ENCOUNTER — Other Ambulatory Visit: Payer: Self-pay | Admitting: Cardiology

## 2013-11-06 ENCOUNTER — Other Ambulatory Visit: Payer: Self-pay | Admitting: *Deleted

## 2013-11-06 MED ORDER — METOPROLOL TARTRATE 25 MG PO TABS: ORAL_TABLET | ORAL | Status: DC

## 2013-11-06 NOTE — Telephone Encounter (Signed)
Spoke with the pt and informed her that we received laboratory/information request paperwork from [facility] stating that she was participating in [place].  They were requesting her health results.  Pt stated that she was not aware of this and she's refusing it.  Informed the pt that I will discard the forms.  Pt agreed.//AB/CMA

## 2013-11-10 ENCOUNTER — Other Ambulatory Visit: Payer: Self-pay | Admitting: *Deleted

## 2013-11-10 DIAGNOSIS — R0989 Other specified symptoms and signs involving the circulatory and respiratory systems: Secondary | ICD-10-CM

## 2013-11-11 ENCOUNTER — Ambulatory Visit: Payer: Medicare Other | Attending: Family Medicine | Admitting: Rehabilitation

## 2013-11-11 DIAGNOSIS — Z5189 Encounter for other specified aftercare: Secondary | ICD-10-CM | POA: Diagnosis not present

## 2013-11-11 DIAGNOSIS — M542 Cervicalgia: Secondary | ICD-10-CM | POA: Diagnosis not present

## 2013-11-11 DIAGNOSIS — M545 Low back pain: Secondary | ICD-10-CM | POA: Diagnosis not present

## 2013-11-12 ENCOUNTER — Encounter (HOSPITAL_COMMUNITY): Payer: Self-pay | Admitting: Emergency Medicine

## 2013-11-12 ENCOUNTER — Ambulatory Visit: Payer: Medicare Other | Admitting: Physical Therapy

## 2013-11-12 ENCOUNTER — Encounter: Payer: Self-pay | Admitting: Family Medicine

## 2013-11-12 ENCOUNTER — Emergency Department (HOSPITAL_COMMUNITY): Payer: Medicare Other

## 2013-11-12 ENCOUNTER — Observation Stay (HOSPITAL_COMMUNITY)
Admission: EM | Admit: 2013-11-12 | Discharge: 2013-11-13 | Disposition: A | Discharge status: Home / Self Care | Payer: Medicare Other | Attending: Internal Medicine | Admitting: Internal Medicine

## 2013-11-12 DIAGNOSIS — I499 Cardiac arrhythmia, unspecified: Secondary | ICD-10-CM | POA: Diagnosis not present

## 2013-11-12 DIAGNOSIS — I471 Supraventricular tachycardia, unspecified: Secondary | ICD-10-CM

## 2013-11-12 DIAGNOSIS — G629 Polyneuropathy, unspecified: Secondary | ICD-10-CM | POA: Diagnosis not present

## 2013-11-12 DIAGNOSIS — K219 Gastro-esophageal reflux disease without esophagitis: Secondary | ICD-10-CM | POA: Insufficient documentation

## 2013-11-12 DIAGNOSIS — K317 Polyp of stomach and duodenum: Secondary | ICD-10-CM | POA: Insufficient documentation

## 2013-11-12 DIAGNOSIS — R748 Abnormal levels of other serum enzymes: Secondary | ICD-10-CM | POA: Diagnosis not present

## 2013-11-12 DIAGNOSIS — N189 Chronic kidney disease, unspecified: Secondary | ICD-10-CM | POA: Insufficient documentation

## 2013-11-12 DIAGNOSIS — R079 Chest pain, unspecified: Secondary | ICD-10-CM | POA: Diagnosis not present

## 2013-11-12 DIAGNOSIS — R011 Cardiac murmur, unspecified: Secondary | ICD-10-CM | POA: Diagnosis not present

## 2013-11-12 DIAGNOSIS — R109 Unspecified abdominal pain: Secondary | ICD-10-CM | POA: Diagnosis not present

## 2013-11-12 DIAGNOSIS — K2100 Gastro-esophageal reflux disease with esophagitis, without bleeding: Secondary | ICD-10-CM

## 2013-11-12 DIAGNOSIS — Z8673 Personal history of transient ischemic attack (TIA), and cerebral infarction without residual deficits: Secondary | ICD-10-CM | POA: Diagnosis not present

## 2013-11-12 DIAGNOSIS — K3184 Gastroparesis: Secondary | ICD-10-CM | POA: Diagnosis not present

## 2013-11-12 DIAGNOSIS — E1169 Type 2 diabetes mellitus with other specified complication: Secondary | ICD-10-CM

## 2013-11-12 DIAGNOSIS — E1121 Type 2 diabetes mellitus with diabetic nephropathy: Secondary | ICD-10-CM

## 2013-11-12 DIAGNOSIS — G458 Other transient cerebral ischemic attacks and related syndromes: Secondary | ICD-10-CM

## 2013-11-12 DIAGNOSIS — R001 Bradycardia, unspecified: Secondary | ICD-10-CM | POA: Diagnosis not present

## 2013-11-12 DIAGNOSIS — R7989 Other specified abnormal findings of blood chemistry: Secondary | ICD-10-CM | POA: Diagnosis not present

## 2013-11-12 DIAGNOSIS — K5790 Diverticulosis of intestine, part unspecified, without perforation or abscess without bleeding: Secondary | ICD-10-CM | POA: Diagnosis not present

## 2013-11-12 DIAGNOSIS — R202 Paresthesia of skin: Secondary | ICD-10-CM | POA: Diagnosis not present

## 2013-11-12 DIAGNOSIS — I129 Hypertensive chronic kidney disease with stage 1 through stage 4 chronic kidney disease, or unspecified chronic kidney disease: Secondary | ICD-10-CM | POA: Diagnosis not present

## 2013-11-12 DIAGNOSIS — R002 Palpitations: Principal | ICD-10-CM

## 2013-11-12 DIAGNOSIS — Z79899 Other long term (current) drug therapy: Secondary | ICD-10-CM | POA: Insufficient documentation

## 2013-11-12 DIAGNOSIS — G609 Hereditary and idiopathic neuropathy, unspecified: Secondary | ICD-10-CM | POA: Insufficient documentation

## 2013-11-12 DIAGNOSIS — I10 Essential (primary) hypertension: Secondary | ICD-10-CM | POA: Diagnosis present

## 2013-11-12 DIAGNOSIS — E1342 Other specified diabetes mellitus with diabetic polyneuropathy: Secondary | ICD-10-CM | POA: Insufficient documentation

## 2013-11-12 DIAGNOSIS — E119 Type 2 diabetes mellitus without complications: Secondary | ICD-10-CM

## 2013-11-12 DIAGNOSIS — R51 Headache: Secondary | ICD-10-CM | POA: Insufficient documentation

## 2013-11-12 DIAGNOSIS — E780 Pure hypercholesterolemia: Secondary | ICD-10-CM | POA: Diagnosis not present

## 2013-11-12 DIAGNOSIS — F419 Anxiety disorder, unspecified: Secondary | ICD-10-CM | POA: Diagnosis not present

## 2013-11-12 DIAGNOSIS — R000 Tachycardia, unspecified: Secondary | ICD-10-CM | POA: Diagnosis not present

## 2013-11-12 DIAGNOSIS — Z5189 Encounter for other specified aftercare: Secondary | ICD-10-CM | POA: Diagnosis not present

## 2013-11-12 DIAGNOSIS — H269 Unspecified cataract: Secondary | ICD-10-CM | POA: Insufficient documentation

## 2013-11-12 DIAGNOSIS — M479 Spondylosis, unspecified: Secondary | ICD-10-CM | POA: Insufficient documentation

## 2013-11-12 DIAGNOSIS — E669 Obesity, unspecified: Secondary | ICD-10-CM

## 2013-11-12 DIAGNOSIS — R778 Other specified abnormalities of plasma proteins: Secondary | ICD-10-CM

## 2013-11-12 DIAGNOSIS — Z7982 Long term (current) use of aspirin: Secondary | ICD-10-CM | POA: Diagnosis not present

## 2013-11-12 DIAGNOSIS — D509 Iron deficiency anemia, unspecified: Secondary | ICD-10-CM | POA: Diagnosis not present

## 2013-11-12 DIAGNOSIS — R0602 Shortness of breath: Secondary | ICD-10-CM | POA: Diagnosis not present

## 2013-11-12 LAB — CBC WITH DIFFERENTIAL/PLATELET
BASOS ABS: 0.1 10*3/uL (ref 0.0–0.1)
BASOS PCT: 1 % (ref 0–1)
Eosinophils Absolute: 0.1 10*3/uL (ref 0.0–0.7)
Eosinophils Relative: 1 % (ref 0–5)
HCT: 33 % — ABNORMAL LOW (ref 36.0–46.0)
HEMOGLOBIN: 10.9 g/dL — AB (ref 12.0–15.0)
Lymphocytes Relative: 34 % (ref 12–46)
Lymphs Abs: 2.6 10*3/uL (ref 0.7–4.0)
MCH: 25.2 pg — ABNORMAL LOW (ref 26.0–34.0)
MCHC: 33 g/dL (ref 30.0–36.0)
MCV: 76.2 fL — ABNORMAL LOW (ref 78.0–100.0)
Monocytes Absolute: 0.8 10*3/uL (ref 0.1–1.0)
Monocytes Relative: 11 % (ref 3–12)
NEUTROS ABS: 4.1 10*3/uL (ref 1.7–7.7)
Neutrophils Relative %: 53 % (ref 43–77)
PLATELETS: 232 10*3/uL (ref 150–400)
RBC: 4.33 MIL/uL (ref 3.87–5.11)
RDW: 14.9 % (ref 11.5–15.5)
WBC: 7.6 10*3/uL (ref 4.0–10.5)

## 2013-11-12 LAB — URINALYSIS, ROUTINE W REFLEX MICROSCOPIC
Bilirubin Urine: NEGATIVE
GLUCOSE, UA: NEGATIVE mg/dL
Ketones, ur: NEGATIVE mg/dL
Nitrite: NEGATIVE
Protein, ur: NEGATIVE mg/dL
SPECIFIC GRAVITY, URINE: 1.004 — AB (ref 1.005–1.030)
UROBILINOGEN UA: 0.2 mg/dL (ref 0.0–1.0)
pH: 6.5 (ref 5.0–8.0)

## 2013-11-12 LAB — URINE MICROSCOPIC-ADD ON

## 2013-11-12 LAB — COMPREHENSIVE METABOLIC PANEL
ALBUMIN: 3.5 g/dL (ref 3.5–5.2)
ALK PHOS: 73 U/L (ref 39–117)
ALT: 23 U/L (ref 0–35)
AST: 25 U/L (ref 0–37)
Anion gap: 16 — ABNORMAL HIGH (ref 5–15)
BUN: 15 mg/dL (ref 6–23)
CHLORIDE: 99 meq/L (ref 96–112)
CO2: 24 mEq/L (ref 19–32)
Calcium: 9.6 mg/dL (ref 8.4–10.5)
Creatinine, Ser: 0.57 mg/dL (ref 0.50–1.10)
GFR calc Af Amer: >90 mL/min (ref >90)
GFR calc non Af Amer: >90 mL/min (ref >90)
Glucose, Bld: 138 mg/dL — ABNORMAL HIGH (ref 70–99)
POTASSIUM: 4 meq/L (ref 3.7–5.3)
SODIUM: 139 meq/L (ref 137–147)
Total Bilirubin: 0.2 mg/dL — ABNORMAL LOW (ref 0.3–1.2)
Total Protein: 7.6 g/dL (ref 6.0–8.3)

## 2013-11-12 LAB — MAGNESIUM: Magnesium: 1.8 mg/dL (ref 1.5–2.5)

## 2013-11-12 LAB — LIPASE, BLOOD: Lipase: 43 U/L (ref 11–59)

## 2013-11-12 LAB — TROPONIN I: Troponin I: <0.3 ng/mL (ref <0.30)

## 2013-11-12 MED ORDER — FENTANYL CITRATE 0.05 MG/ML IJ SOLN
50.0000 ug | Freq: Once | INTRAMUSCULAR | Status: DC
  Filled 2013-11-12: qty 2

## 2013-11-12 MED ORDER — ACETAMINOPHEN 325 MG PO TABS
650.0000 mg | ORAL_TABLET | Freq: Once | ORAL | Status: AC
  Administered 2013-11-12: 650 mg via ORAL
  Filled 2013-11-12: qty 2

## 2013-11-12 MED ORDER — SODIUM CHLORIDE 0.9 % IV BOLUS (SEPSIS)
1000.0000 mL | Freq: Once | INTRAVENOUS | Status: AC
  Administered 2013-11-12: 1000 mL via INTRAVENOUS

## 2013-11-12 NOTE — ED Notes (Signed)
Pt stated she used the bathroom while she was getting scans done and doesn't have to go right now. RN notified

## 2013-11-12 NOTE — ED Provider Notes (Signed)
CSN: [id number]     Arrival date & time 11/12/13  2027 History   First MD Initiated Contact with Patient 11/12/13 2033     Chief Complaint  Patient presents with  . Tachycardia  . Abdominal Pain     (Consider location/radiation/quality/duration/timing/severity/associated sxs/prior Treatment) HPI Comments: Patient is a 73 yo F PMHx significant for SVT, DM, HTN, HLD, CKD presenting to the ED for an episode of tachycardia that occurred earlier this evening. Patient states that she felt her heart recently extremely fast, that felt like her SVT. She states she attempted multiple vagal maneuvers and took her metoprolol, which resolved her symptoms by the time EMS arrived. Patient is complaining of some mild chest discomfort and a generalized headache. She reports that these are getting better with time. Patient had recent carotid artery studies done last month with 1-39% bilateral ICA stenosis, was seen by Dr. Percival Spanish and her cardiologist within the last 6 months, has a followup appointment this week with him. Her last echocardiogram was 1 year ago. Patient also states that she awoke yesterday morning with left-sided arm and leg numbness that has resolved. Patient has a history of a TIA in the past.  Patient is a 73 y.o. female presenting with abdominal pain.  Abdominal Pain Associated symptoms: chest pain     Past Medical History  Diagnosis Date  . Gastroparesis   . Pure hypercholesterolemia   . Headache(784.0)   . Unspecified essential hypertension   . Esophageal reflux   . Type II or unspecified type diabetes mellitus without mention of complication, not stated as uncontrolled   . Arthritis     Spinal Osteoarthritis  . Diverticulosis 08/30/2000    Colonoscopy   . Gastric polyp     Fundic Gland  . Anxiety   . Cataract   . Heart murmur     Echocardiogram 2/11: EF 60-65%, mild LAE, grade 1 diastolic dysfunction, aortic valve sclerosis, mean gradient 9 mm of mercury, PASP 34  . Chest  pain     Myoview 1/11: EF 82%, no ischemia.  . Carcinoid tumor of stomach   . PSVT (paroxysmal supraventricular tachycardia)   . Leg swelling     bilateral  . Chronic kidney disease     Left kidney smaller than right kidney  . Cancer   . PONV (postoperative nausea and vomiting)     pt states only needs small amount of anesthesia  . Stroke     tia, 2014  . Iron deficiency anemia, unspecified   . Encounter for preventative adult health care exam with abnormal findings 09/14/2013  . Unspecified hereditary and idiopathic peripheral neuropathy 10/29/2013  . Diabetic peripheral neuropathy 10/29/2013   Past Surgical History  Procedure Laterality Date  . Cholecystectomy  1993  . Tonsillectomy    . Colonoscopy  11/11/2010    diverticulosis  . Esophagogastroduodenoscopy  08/29/2010; 09/15/2010    Carcinoid tumor less than 1 cm in July 2012 not seen in August 2012 , gastritis, fundic gland polyps  . Eus  12/15/2010    Procedure: UPPER ENDOSCOPIC ULTRASOUND (EUS) LINEAR;  Surgeon: Owens Loffler, MD;  Location: [facility];  Service: Endoscopy;  Laterality: N/A;  . Esophagogastroduodenoscopy  05/16/2011  . Esophagogastroduodenoscopy  06/14/2012  . Dilatation & currettage/hysteroscopy with resectocope N/A 02/25/2013    Procedure: Attempted hysteroscopy with uterine perforation;  Surgeon: Jamey Reas de Berton Lan, MD;  Location: [facility];  Service: Gynecology;  Laterality: N/A;  . Laparoscopy N/A 02/25/2013  Procedure: Cystoscopy and laparoscopy with fulguration of uterine serosa;  Surgeon: Jamey Reas de Berton Lan, MD;  Location: [facility];  Service: Gynecology;  Laterality: N/A;   Family History  Problem Relation Age of Onset  . Diabetes Mother   . Stroke Father     deceased age 5  . Heart disease Sister     deceased MI age 47  . Heart disease Brother     deceased MI age 35  . Colon cancer Neg Hx   . Esophageal cancer Neg Hx   . Stomach cancer Neg Hx   . Rectal cancer Neg Hx    . Diabetes Maternal Grandmother   . Hypertension Paternal Grandmother   . Diabetes Sister   . Heart disease Sister   . Hypertension Sister   . Hyperlipidemia Sister   . Diabetes Sister   . Heart disease Sister   . Hypertension Sister   . Hyperlipidemia Sister   . Diabetes Brother   . Heart disease Brother   . Hypertension Brother   . Hyperlipidemia Brother    History  Substance Use Topics  . Smoking status: Never Smoker   . Smokeless tobacco: Never Used     Comment: Daily Caffeine Use -1  . Alcohol Use: No   OB History   Grav Para Term Preterm Abortions TAB SAB Ect Mult Living   0              Review of Systems  Eyes: Negative for visual disturbance.  Cardiovascular: Positive for chest pain and palpitations.  Gastrointestinal: Positive for abdominal pain.  Neurological: Positive for headaches. Negative for syncope.  All other systems reviewed and are negative.     Allergies  Tramadol  Home Medications   Prior to Admission medications   Medication Sig Start Date End Date Taking? Authorizing Provider  acetaminophen (TYLENOL) 500 MG tablet Take 2 tablets (1,000 mg total) by mouth every 6 (six) hours as needed for moderate pain. 02/26/13  Yes [place] Berton Lan, MD  ALPRAZolam Duanne Moron) 0.25 MG tablet Take 1 tablet (0.25 mg total) by mouth 3 (three) times daily as needed for anxiety. 06/11/13  Yes Minus Breeding, MD  amLODipine (NORVASC) 5 MG tablet Take 5 mg by mouth at bedtime.   Yes Historical Provider, MD  aspirin 81 MG tablet Take 81 mg by mouth daily.   Yes Historical Provider, MD  cholecalciferol (VITAMIN D) 1000 UNITS tablet Take 1,000 Units by mouth daily.     Yes Historical Provider, MD  dicyclomine (BENTYL) 10 MG capsule Take 1 capsule (10 mg total) by mouth every 6 (six) hours as needed (abdominal pain- may take 2 capsules if 1 ineffective). 09/10/13 09/10/14 Yes Gatha Mayer, MD  esomeprazole (NEXIUM) 40 MG capsule Take 1 capsule (40 mg total)  by mouth daily before breakfast. 01/09/13  Yes Gatha Mayer, MD  folic acid-pyridoxine-cyancobalamin (FOLBIC) 2.5-25-2 MG TABS Take 1 tablet by mouth daily. 05/16/13  Yes Renato Shin, MD  metFORMIN (GLUCOPHAGE-XR) 500 MG 24 hr tablet Take 2 tablets (1,000 mg total) by mouth 2 (two) times daily. 02/07/13  Yes Renato Shin, MD  metoCLOPramide (REGLAN) 5 MG tablet Take 5 mg by mouth 2 (two) times daily. 05/22/13  Yes Historical Provider, MD  metoprolol tartrate (LOPRESSOR) 25 MG tablet Take 1 tablet (25 mg total) by mouth 2 (two) times   daily. 11/06/13  Yes Minus Breeding, MD  Multiple Vitamin (MULTIVITAMIN) tablet Take 1 tablet by mouth  daily.     Yes Historical Provider, MD  ondansetron (ZOFRAN) 4 MG tablet Take 4 mg by mouth every 4 (four) hours as needed. For nausea   06/02/10  Yes Renato Shin, MD  potassium chloride (K-DUR,KLOR-CON) 10 MEQ tablet Take 1 tablet (10 mEq total) by mouth daily. 09/11/13 12/31/14 Yes [name], MD  Propylene Glycol (SYSTANE BALANCE OP) Apply 1 drop to eye 2 (two) times daily.   Yes Historical Provider, MD  rosuvastatin (CRESTOR) 10 MG tablet Take 1 tablet (10 mg total) by mouth daily. 11/26/12  Yes Elayne Snare, MD  valsartan (DIOVAN) 320 MG tablet Take 1 tablet (320 mg total) by mouth daily. 10/30/13  Yes Minus Breeding, MD   BP 128/57  Pulse 71  Temp(Src) 98.4 F (36.9 C) (Oral)  Resp 18  SpO2 99%  LMP 02/07/1992 Physical Exam  Nursing note and vitals reviewed. Constitutional: She is oriented to person, place, and time. She appears well-developed and well-nourished. No distress.  HENT:  Head: Normocephalic and atraumatic.  Right Ear: External ear normal.  Left Ear: External ear normal.  Nose: Nose normal.  Mouth/Throat: Oropharynx is clear and moist. No oropharyngeal exudate.  Eyes: Conjunctivae and EOM are normal. Pupils are equal, round, and reactive to light.  Neck: Normal range of motion. Neck supple.  Cardiovascular: Normal rate, regular rhythm,  normal heart sounds and intact distal pulses.   Pulmonary/Chest: Effort normal and breath sounds normal. No respiratory distress.  Abdominal: Soft. Bowel sounds are normal. There is no tenderness.  Musculoskeletal: Normal range of motion. She exhibits no edema.  Neurological: She is alert and oriented to person, place, and time. She has normal strength. No cranial nerve deficit. Gait normal. GCS eye subscore is 4. GCS verbal subscore is 5. GCS motor subscore is 6.  Sensation grossly intact.  No pronator drift.  Bilateral heel-knee-shin intact.  Skin: Skin is warm and dry. She is not diaphoretic.    ED Course  Procedures (including critical care time) Medications  sodium chloride 0.9 % bolus 1,000 mL (1,000 mLs Intravenous New Bag/Given 11/12/13 2201)  acetaminophen (TYLENOL) tablet 650 mg (650 mg Oral Given 11/12/13 2252)    Labs Review Labs Reviewed  CBC WITH DIFFERENTIAL - Abnormal; Notable for the following:    Hemoglobin 10.9 (*)    HCT 33.0 (*)    MCV 76.2 (*)    MCH 25.2 (*)    All other components within normal limits  COMPREHENSIVE METABOLIC PANEL - Abnormal; Notable for the following:    Glucose, Bld 138 (*)    Total Bilirubin 0.2 (*)    Anion gap 16 (*)    All other components within normal limits  URINALYSIS, ROUTINE W REFLEX MICROSCOPIC - Abnormal; Notable for the following:    Color, Urine STRAW (*)    Specific Gravity, Urine 1.004 (*)    Hgb urine dipstick TRACE (*)    Leukocytes, UA TRACE (*)    All other components within normal limits  URINE CULTURE  LIPASE, BLOOD  TROPONIN I  MAGNESIUM  URINE MICROSCOPIC-ADD ON  TROPONIN I    Imaging Review Dg Chest 2 View  11/12/2013   CLINICAL DATA:  Acute onset of diffuse chest pain and shortness breath. Diffuse left-sided numbness. Current history of hypertension and diabetes.  EXAM: CHEST  2 VIEW  COMPARISON:  Chest radiograph performed 07/13/2012  FINDINGS: The lungs are well-aerated and clear. There is no evidence  of focal opacification, pleural effusion or pneumothorax.  The heart  is normal in size; the mediastinal contour is within normal limits. No acute osseous abnormalities are seen. Clips are noted within the right upper quadrant, reflecting prior cholecystectomy.  IMPRESSION: No acute cardiopulmonary process seen.   Electronically Signed   By: Garald Balding M.D.   On: 11/12/2013 22:41   Ct Head Wo Contrast  11/12/2013   CLINICAL DATA:  Acute onset of paresthesias. Supraventricular tachycardia earlier tonight. Acute onset of headache. Initial encounter.  EXAM: CT HEAD WITHOUT CONTRAST  TECHNIQUE: Contiguous axial images were obtained from the base of the skull through the vertex without intravenous contrast.  COMPARISON:  CT of the head, and MRI of the brain performed 01/16/2011  FINDINGS: There is no evidence of acute infarction, mass lesion, or intra- or extra-axial hemorrhage on CT.  Prominence of the sulci reflects mild cortical volume loss. 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  The posterior fossa, including the cerebellum, brainstem and fourth ventricle, is within normal limits. The third and lateral ventricles, and basal ganglia are unremarkable in appearance. The cerebral hemispheres are symmetric in appearance, with normal gray-white differentiation. No mass effect or midline shift is seen.  There is no evidence of fracture; visualized osseous structures are unremarkable in appearance. The orbits are within normal limits. The paranasal sinuses and mastoid air cells are well-aerated. No significant soft tissue abnormalities are seen.  IMPRESSION: 1. No acute intracranial pathology seen on CT. 2. Mild cortical volume loss noted.   Electronically Signed   By: Garald Balding M.D.   On: 11/12/2013 22:23     EKG Interpretation   Date/Time:  Wednesday November 12 2013 20:35:03 EDT Ventricular Rate:  82 PR Interval:  203 QRS Duration: 69 QT Interval:  354 QTC Calculation: 413 R Axis:   24 Text  Interpretation:  Sinus rhythm Low voltage, precordial leads Abnormal  R-wave progression, early transition No significant change was found  Confirmed by Wyvonnia Dusky  MD, STEPHEN [phone]) on 11/12/2013 8:50:55 PM       Discussed patient with Dr. Armida Sans, given negative CT scan, resolved symptoms with no neurofocal deficits on examination, with negative carotid duplex w/in last month recommends patient can be re-evaluated outpatient for any medication changes.   MDM   Final diagnoses:  None    Filed Vitals:   11/13/13 0003  BP: 128/57  Pulse: 71  Temp:   Resp: 18   Afebrile, NAD, non-toxic appearing, AAOx4.   I have reviewed nursing notes, vital signs, and all appropriate lab and imaging results for this patient.  No neurofocal deficits. Lungs clear to auscultation bilaterally. Regular rate and rhythm intact distal pulses exam. No SVT on EKG. Patient maintained normal sinus rhythm throughout emergency department stay. Headache and chest pain resolved with Tylenol. Will have patient followup as outpatient with her PCP and cardiologist regarding her possible TIA as advised by Dr. Armida Sans. Patient is agreeable to outpatient follow up. CP is likely d/t SVT episode. Plan to delta trop patient with planned discharge home. Dr. Wyvonnia Dusky will follow second troponin.  Patient d/w with Dr. Wyvonnia Dusky, agrees with plan.     [place], PA-C 11/13/13 0038

## 2013-11-12 NOTE — ED Notes (Signed)
ED PA at bedside

## 2013-11-12 NOTE — ED Notes (Signed)
Pt presents from home via [facility] with c/o tachycardia (120-130bpm), EMS noted patient to be in SVT and had patient perform vagal maneuvers. Pt converted to 83bpm and NSR at that time. the patient reports epigastric/upper abdominal discomfort and headache.  Pt is A&Ox4 and in NAD at this time.

## 2013-11-13 DIAGNOSIS — R778 Other specified abnormalities of plasma proteins: Secondary | ICD-10-CM | POA: Diagnosis present

## 2013-11-13 DIAGNOSIS — G458 Other transient cerebral ischemic attacks and related syndromes: Secondary | ICD-10-CM

## 2013-11-13 DIAGNOSIS — I10 Essential (primary) hypertension: Secondary | ICD-10-CM | POA: Diagnosis not present

## 2013-11-13 DIAGNOSIS — I471 Supraventricular tachycardia: Secondary | ICD-10-CM | POA: Diagnosis not present

## 2013-11-13 DIAGNOSIS — R002 Palpitations: Principal | ICD-10-CM

## 2013-11-13 DIAGNOSIS — K3184 Gastroparesis: Secondary | ICD-10-CM | POA: Diagnosis not present

## 2013-11-13 DIAGNOSIS — E1121 Type 2 diabetes mellitus with diabetic nephropathy: Secondary | ICD-10-CM

## 2013-11-13 DIAGNOSIS — R079 Chest pain, unspecified: Secondary | ICD-10-CM

## 2013-11-13 DIAGNOSIS — K210 Gastro-esophageal reflux disease with esophagitis: Secondary | ICD-10-CM | POA: Diagnosis not present

## 2013-11-13 DIAGNOSIS — R7989 Other specified abnormal findings of blood chemistry: Secondary | ICD-10-CM | POA: Diagnosis present

## 2013-11-13 LAB — BASIC METABOLIC PANEL
ANION GAP: 16 — AB (ref 5–15)
BUN: 12 mg/dL (ref 6–23)
CHLORIDE: 103 meq/L (ref 96–112)
CO2: 21 meq/L (ref 19–32)
Calcium: 9.2 mg/dL (ref 8.4–10.5)
Creatinine, Ser: 0.62 mg/dL (ref 0.50–1.10)
GFR calc non Af Amer: 88 mL/min — ABNORMAL LOW (ref >90)
Glucose, Bld: 162 mg/dL — ABNORMAL HIGH (ref 70–99)
POTASSIUM: 3.9 meq/L (ref 3.7–5.3)
Sodium: 140 mEq/L (ref 137–147)

## 2013-11-13 LAB — CBG MONITORING, ED: Glucose-Capillary: 98 mg/dL (ref 70–99)

## 2013-11-13 LAB — TROPONIN I
TROPONIN I: 0.39 ng/mL — AB (ref <0.30)
Troponin I: <0.3 ng/mL

## 2013-11-13 LAB — GLUCOSE, CAPILLARY
Glucose-Capillary: 161 mg/dL — ABNORMAL HIGH (ref 70–99)
Glucose-Capillary: 108 mg/dL — ABNORMAL HIGH (ref 70–99)
Glucose-Capillary: 152 mg/dL — ABNORMAL HIGH (ref 70–99)

## 2013-11-13 MED ORDER — POTASSIUM CHLORIDE CRYS ER 10 MEQ PO TBCR
10.0000 meq | EXTENDED_RELEASE_TABLET | Freq: Every day | ORAL | Status: DC
  Administered 2013-11-13: 10 meq via ORAL
  Filled 2013-11-13: qty 1

## 2013-11-13 MED ORDER — POLYVINYL ALCOHOL 1.4 % OP SOLN
1.0000 [drp] | Freq: Every day | OPHTHALMIC | Status: DC
  Filled 2013-11-13: qty 15

## 2013-11-13 MED ORDER — ROSUVASTATIN CALCIUM 10 MG PO TABS
10.0000 mg | ORAL_TABLET | Freq: Every day | ORAL | Status: DC
  Filled 2013-11-13: qty 1

## 2013-11-13 MED ORDER — METOCLOPRAMIDE HCL 5 MG PO TABS
5.0000 mg | ORAL_TABLET | Freq: Two times a day (BID) | ORAL | Status: DC
  Filled 2013-11-13 (×3): qty 1

## 2013-11-13 MED ORDER — METFORMIN HCL ER 500 MG PO TB24
1000.0000 mg | ORAL_TABLET | Freq: Two times a day (BID) | ORAL | Status: DC
  Administered 2013-11-13: 1000 mg via ORAL
  Filled 2013-11-13 (×3): qty 2

## 2013-11-13 MED ORDER — DICYCLOMINE HCL 10 MG PO CAPS
10.0000 mg | ORAL_CAPSULE | Freq: Four times a day (QID) | ORAL | Status: DC | PRN
  Filled 2013-11-13: qty 2

## 2013-11-13 MED ORDER — PANTOPRAZOLE SODIUM 40 MG PO TBEC
40.0000 mg | DELAYED_RELEASE_TABLET | Freq: Every day | ORAL | Status: DC
Start: 2013-11-13 — End: 2013-11-13
  Administered 2013-11-13: 40 mg via ORAL
  Filled 2013-11-13: qty 1

## 2013-11-13 MED ORDER — SODIUM CHLORIDE 0.9 % IJ SOLN
3.0000 mL | Freq: Two times a day (BID) | INTRAMUSCULAR | Status: DC
  Administered 2013-11-13 (×2): 3 mL via INTRAVENOUS

## 2013-11-13 MED ORDER — ALPRAZOLAM 0.25 MG PO TABS: 0.2500 mg | ORAL_TABLET | Freq: Three times a day (TID) | ORAL | Status: DC | PRN

## 2013-11-13 MED ORDER — METOPROLOL TARTRATE 25 MG PO TABS
25.0000 mg | ORAL_TABLET | Freq: Two times a day (BID) | ORAL | Status: DC
  Filled 2013-11-13 (×2): qty 1

## 2013-11-13 MED ORDER — ASPIRIN EC 81 MG PO TBEC
81.0000 mg | DELAYED_RELEASE_TABLET | Freq: Every day | ORAL | Status: DC
  Administered 2013-11-13: 81 mg via ORAL
  Filled 2013-11-13: qty 1

## 2013-11-13 MED ORDER — IRBESARTAN 300 MG PO TABS
300.0000 mg | ORAL_TABLET | Freq: Every day | ORAL | Status: DC
Start: 2013-11-13 — End: 2013-11-13
  Administered 2013-11-13: 300 mg via ORAL
  Filled 2013-11-13: qty 1

## 2013-11-13 MED ORDER — VITAMIN D3 25 MCG (1000 UNIT) PO TABS
1000.0000 [IU] | ORAL_TABLET | Freq: Every day | ORAL | Status: DC
  Administered 2013-11-13: 1000 [IU] via ORAL
  Filled 2013-11-13: qty 1

## 2013-11-13 MED ORDER — METOPROLOL TARTRATE 25 MG PO TABS: ORAL_TABLET | ORAL | Status: DC

## 2013-11-13 MED ORDER — FA-PYRIDOXINE-CYANOCOBALAMIN 2.5-25-2 MG PO TABS
1.0000 | ORAL_TABLET | Freq: Every day | ORAL | Status: DC
  Administered 2013-11-13: 1 via ORAL
  Filled 2013-11-13: qty 1

## 2013-11-13 MED ORDER — METOPROLOL TARTRATE 50 MG PO TABS
50.0000 mg | ORAL_TABLET | Freq: Two times a day (BID) | ORAL | Status: DC
  Administered 2013-11-13: 50 mg via ORAL
  Filled 2013-11-13 (×2): qty 1

## 2013-11-13 MED ORDER — ACETAMINOPHEN 325 MG PO TABS: 650.0000 mg | ORAL_TABLET | Freq: Four times a day (QID) | ORAL | Status: DC | PRN

## 2013-11-13 MED ORDER — ASPIRIN 81 MG PO CHEW
324.0000 mg | CHEWABLE_TABLET | Freq: Once | ORAL | Status: DC
  Filled 2013-11-13: qty 4

## 2013-11-13 MED ORDER — AMLODIPINE BESYLATE 5 MG PO TABS: 5.0000 mg | ORAL_TABLET | Freq: Once | ORAL | Status: DC

## 2013-11-13 MED ORDER — HEPARIN SODIUM (PORCINE) 5000 UNIT/ML IJ SOLN
5000.0000 [IU] | Freq: Three times a day (TID) | INTRAMUSCULAR | Status: DC
  Filled 2013-11-13 (×3): qty 1

## 2013-11-13 MED ORDER — ADULT MULTIVITAMIN W/MINERALS CH
1.0000 | ORAL_TABLET | Freq: Every day | ORAL | Status: DC
  Administered 2013-11-13: 1 via ORAL
  Filled 2013-11-13: qty 1

## 2013-11-13 MED ORDER — AMLODIPINE BESYLATE 5 MG PO TABS
5.0000 mg | ORAL_TABLET | Freq: Every day | ORAL | Status: DC
  Filled 2013-11-13: qty 1

## 2013-11-13 NOTE — Discharge Instructions (Addendum)
Palpitations Follow up with your heart doctor as scheduled. Return to the ED if you develop new or worsening symptoms. A palpitation is the feeling that your heartbeat is irregular or is faster than normal. It may feel like your heart is fluttering or skipping a beat. Palpitations are usually not a serious problem. However, in some cases, you may need further medical evaluation. CAUSES  Palpitations can be caused by:  Smoking.  Caffeine or other stimulants, such as diet pills or energy drinks.  Alcohol.  Stress and anxiety.  Strenuous physical activity.  Fatigue.  Certain medicines.  Heart disease, especially if you have a history of irregular heart rhythms (arrhythmias), such as atrial fibrillation, atrial flutter, or supraventricular tachycardia.  An improperly working pacemaker or defibrillator. DIAGNOSIS  To find the cause of your palpitations, your health care provider will take your medical history and perform a physical exam. Your health care provider may also have you take a test called an ambulatory electrocardiogram (ECG). An ECG records your heartbeat patterns over a 24-hour period. You may also have other tests, such as:  Transthoracic echocardiogram (TTE). During echocardiography, sound waves are used to evaluate how blood flows through your heart.  Transesophageal echocardiogram (TEE).  Cardiac monitoring. This allows your health care provider to monitor your heart rate and rhythm in real time.  Holter monitor. This is a portable device that records your heartbeat and can help diagnose heart arrhythmias. It allows your health care provider to track your heart activity for several days, if needed.  Stress tests by exercise or by giving medicine that makes the heart beat faster. TREATMENT  Treatment of palpitations depends on the cause of your symptoms and can vary greatly. Most cases of palpitations do not require any treatment other than time, relaxation, and  monitoring your symptoms. Other causes, such as atrial fibrillation, atrial flutter, or supraventricular tachycardia, usually require further treatment. HOME CARE INSTRUCTIONS   Avoid:  Caffeinated coffee, tea, soft drinks, diet pills, and energy drinks.  Chocolate.  Alcohol.  Stop smoking if you smoke.  Reduce your stress and anxiety. Things that can help you relax include:  A method of controlling things in your body, such as your heartbeats, with your mind (biofeedback).  Yoga.  Meditation.  Physical activity such as swimming, jogging, or walking.  Get plenty of rest and sleep. SEEK MEDICAL CARE IF:   You continue to have a fast or irregular heartbeat beyond 24 hours.  Your palpitations occur more often. SEEK IMMEDIATE MEDICAL CARE IF:  You have chest pain or shortness of breath.  You have a severe headache.  You feel dizzy or you faint. MAKE SURE YOU:  Understand these instructions.  Will watch your condition.  Will get help right away if you are not doing well or get worse. Document Released: 01/21/2000 Document Revised: 01/28/2013 Document Reviewed: 03/24/2011 [facility] Patient Information 2015 [place], [place]. This information is not intended to replace advice given to you by your health care provider. Make sure you discuss any questions you have with your health care provider.  Diabetes and Exercise Exercising regularly is important. It is not just about losing weight. It has many health benefits, such as:  Improving your overall fitness, flexibility, and endurance.  Increasing your bone density.  Helping with weight control.  Decreasing your body fat.  Increasing your muscle strength.  Reducing stress and tension.  Improving your overall health. People with diabetes who exercise gain additional benefits because exercise:  Reduces appetite.  Improves  the body's use of blood sugar (glucose).  Helps lower or control blood glucose.  Decreases  blood pressure.  Helps control blood lipids (such as cholesterol and triglycerides).  Improves the body's use of the hormone insulin by:  Increasing the body's insulin sensitivity.  Reducing the body's insulin needs.  Decreases the risk for heart disease because exercising:  Lowers cholesterol and triglycerides levels.  Increases the levels of good cholesterol (such as high-density lipoproteins [HDL]) in the body.  Lowers blood glucose levels. YOUR ACTIVITY PLAN  Choose an activity that you enjoy and set realistic goals. Your health care provider or diabetes educator can help you make an activity plan that works for you. Exercise regularly as directed by your health care provider. This includes:  Performing resistance training twice a week such as push-ups, sit-ups, lifting weights, or using resistance bands.  Performing 150 minutes of cardio exercises each week such as walking, running, or playing sports.  Staying active and spending no more than 90 minutes at one time being inactive. Even short bursts of exercise are good for you. Three 10-minute sessions spread throughout the day are just as beneficial as a single 30-minute session. Some exercise ideas include:  Taking the dog for a walk.  Taking the stairs instead of the elevator.  Dancing to your favorite song.  Doing an exercise video.  Doing your favorite exercise with a friend. RECOMMENDATIONS FOR EXERCISING WITH TYPE 1 OR TYPE 2 DIABETES   Check your blood glucose before exercising. If blood glucose levels are greater than 240 mg/dL, check for urine ketones. Do not exercise if ketones are present.  Avoid injecting insulin into areas of the body that are going to be exercised. For example, avoid injecting insulin into:  The arms when playing tennis.  The legs when jogging.  Keep a record of:  Food intake before and after you exercise.  Expected peak times of insulin action.  Blood glucose levels before and  after you exercise.  The type and amount of exercise you have done.  Review your records with your health care provider. Your health care provider will help you to develop guidelines for adjusting food intake and insulin amounts before and after exercising.  If you take insulin or oral hypoglycemic agents, watch for signs and symptoms of hypoglycemia. They include:  Dizziness.  Shaking.  Sweating.  Chills.  Confusion.  Drink plenty of water while you exercise to prevent dehydration or heat stroke. Body water is lost during exercise and must be replaced.  Talk to your health care provider before starting an exercise program to make sure it is safe for you. Remember, almost any type of activity is better than none. Document Released: 04/15/2003 Document Revised: 06/09/2013 Document Reviewed: 07/02/2012 [facility] Patient Information 2015 [place], [place]. This information is not intended to replace advice given to you by your health care provider. Make sure you discuss any questions you have with your health care provider.

## 2013-11-13 NOTE — ED Notes (Signed)
Attempted report X1

## 2013-11-13 NOTE — ED Notes (Signed)
Attempted report X2 

## 2013-11-13 NOTE — H&P (Signed)
[facility] History and Physical  [name] WNI:[id number] DOB: [birth year] DOA: 11/12/2013  Referring physician: ED physician PCP: Penni Homans, MD  Specialists:   Chief Complaint: Palpitation, and epigastric and chest pain  HPI: [name] is a 73 y.o. female with PMHx significant for SVT, DM-II, HTN, HLD, TIA, gastroparesis, GERD, who presents with palpitation, and epigastric and chest pain.  Patient states that she started having palpitation at about 7:00 PM.  She felt her heart beats extremely fast, which was like her previous SVT. She called EMS, and at the same time, she attempted vagal maneuvers by putting ice on head and face, and took her ASA and metoprolol, which resolved her symptoms by the time EMS arrived. Patient is also complaining of mild chest pain and epigastric pain. She does not have fever, chills, cough, shortness of breath. No nausea, vomiting or diarrhea.  Patient also reports that she had numbness over her left arm and leg two days ago, which resolved spontaneously in the next day. She did not have weakness in her extremities. She had dizziness yesterday. Of note, patient had recent carotid artery studies done last month with 1-39% bilateral ICA stenosis.  The patient was found to have a first degree AV block on EKG, and elevated troponin 0.39. She is admitted to inpatient for further evaluation and treatment.   Review of Systems: As presented in the history of presenting illness, rest negative.  Where does patient live? Lives alone at home  Can patient participate in ADLs? Yes  Allergy:  Allergies  Allergen Reactions  . Tramadol     Dizziness     Past Medical History  Diagnosis Date  . Gastroparesis   . Pure hypercholesterolemia   . Headache(784.0)   . Unspecified essential hypertension   . Esophageal reflux   . Type II or unspecified type diabetes mellitus without mention of complication, not stated as uncontrolled   .  Arthritis     Spinal Osteoarthritis  . Diverticulosis 08/30/2000    Colonoscopy   . Gastric polyp     Fundic Gland  . Anxiety   . Cataract   . Heart murmur     Echocardiogram 2/11: EF 60-65%, mild LAE, grade 1 diastolic dysfunction, aortic valve sclerosis, mean gradient 9 mm of mercury, PASP 34  . Chest pain     Myoview 1/11: EF 82%, no ischemia.  . Carcinoid tumor of stomach   . PSVT (paroxysmal supraventricular tachycardia)   . Leg swelling     bilateral  . Chronic kidney disease     Left kidney smaller than right kidney  . Cancer   . PONV (postoperative nausea and vomiting)     pt states only needs small amount of anesthesia  . Stroke     tia, 2014  . Iron deficiency anemia, unspecified   . Encounter for preventative adult health care exam with abnormal findings 09/14/2013  . Unspecified hereditary and idiopathic peripheral neuropathy 10/29/2013  . Diabetic peripheral neuropathy 10/29/2013    Past Surgical History  Procedure Laterality Date  . Cholecystectomy  1993  . Tonsillectomy    . Colonoscopy  11/11/2010    diverticulosis  . Esophagogastroduodenoscopy  08/29/2010; 09/15/2010    Carcinoid tumor less than 1 cm in July 2012 not seen in August 2012 , gastritis, fundic gland polyps  . Eus  12/15/2010    Procedure: UPPER ENDOSCOPIC ULTRASOUND (EUS) LINEAR;  Surgeon: Owens Loffler, MD;  Location: [facility];  Service: Endoscopy;  Laterality: N/A;  .  Esophagogastroduodenoscopy  05/16/2011  . Esophagogastroduodenoscopy  06/14/2012  . Dilatation & currettage/hysteroscopy with resectocope N/A 02/25/2013    Procedure: Attempted hysteroscopy with uterine perforation;  Surgeon: Jamey Reas de Berton Lan, MD;  Location: [facility];  Service: Gynecology;  Laterality: N/A;  . Laparoscopy N/A 02/25/2013    Procedure: Cystoscopy and laparoscopy with fulguration of uterine serosa;  Surgeon: Jamey Reas de Berton Lan, MD;  Location: [facility];  Service: Gynecology;  Laterality: N/A;     Social History:  reports that she has never smoked. She has never used smokeless tobacco. She reports that she does not drink alcohol or use illicit drugs.  Family History:  Family History  Problem Relation Age of Onset  . Diabetes Mother   . Stroke Father     deceased age 17  . Heart disease Sister     deceased MI age 58  . Heart disease Brother     deceased MI age 77  . Colon cancer Neg Hx   . Esophageal cancer Neg Hx   . Stomach cancer Neg Hx   . Rectal cancer Neg Hx   . Diabetes Maternal Grandmother   . Hypertension Paternal Grandmother   . Diabetes Sister   . Heart disease Sister   . Hypertension Sister   . Hyperlipidemia Sister   . Diabetes Sister   . Heart disease Sister   . Hypertension Sister   . Hyperlipidemia Sister   . Diabetes Brother   . Heart disease Brother   . Hypertension Brother   . Hyperlipidemia Brother      Prior to Admission medications   Medication Sig Start Date End Date Taking? Authorizing Provider  acetaminophen (TYLENOL) 500 MG tablet Take 2 tablets (1,000 mg total) by mouth every 6 (six) hours as needed for moderate pain. 02/26/13  Yes Vinton Berton Lan, MD  ALPRAZolam Duanne Moron) 0.25 MG tablet Take 1 tablet (0.25 mg total) by mouth 3 (three) times daily as needed for anxiety. 06/11/13  Yes Minus Breeding, MD  amLODipine (NORVASC) 5 MG tablet Take 5 mg by mouth at bedtime.   Yes Historical Provider, MD  aspirin 81 MG tablet Take 81 mg by mouth daily.   Yes Historical Provider, MD  cholecalciferol (VITAMIN D) 1000 UNITS tablet Take 1,000 Units by mouth daily.     Yes Historical Provider, MD  dicyclomine (BENTYL) 10 MG capsule Take 1 capsule (10 mg total) by mouth every 6 (six) hours as needed (abdominal pain- may take 2 capsules if 1 ineffective). 09/10/13 09/10/14 Yes Gatha Mayer, MD  esomeprazole (NEXIUM) 40 MG capsule Take 1 capsule (40 mg total) by mouth daily before breakfast. 01/09/13  Yes Gatha Mayer, MD  folic  acid-pyridoxine-cyancobalamin (FOLBIC) 2.5-25-2 MG TABS Take 1 tablet by mouth daily. 05/16/13  Yes Renato Shin, MD  metFORMIN (GLUCOPHAGE-XR) 500 MG 24 hr tablet Take 2 tablets (1,000 mg total) by mouth 2 (two) times daily. 02/07/13  Yes Renato Shin, MD  metoCLOPramide (REGLAN) 5 MG tablet Take 5 mg by mouth 2 (two) times daily. 05/22/13  Yes Historical Provider, MD  metoprolol tartrate (LOPRESSOR) 25 MG tablet Take 1 tablet (25 mg total) by mouth 2 (two) times   daily. 11/06/13  Yes Minus Breeding, MD  Multiple Vitamin (MULTIVITAMIN) tablet Take 1 tablet by mouth daily.     Yes Historical Provider, MD  ondansetron (ZOFRAN) 4 MG tablet Take 4 mg by mouth every 4 (four) hours as needed. For  nausea   06/02/10  Yes Renato Shin, MD  potassium chloride (K-DUR,KLOR-CON) 10 MEQ tablet Take 1 tablet (10 mEq total) by mouth daily. 09/11/13 12/31/14 Yes [name], MD  Propylene Glycol (SYSTANE BALANCE OP) Apply 1 drop to eye 2 (two) times daily.   Yes Historical Provider, MD  rosuvastatin (CRESTOR) 10 MG tablet Take 1 tablet (10 mg total) by mouth daily. 11/26/12  Yes Elayne Snare, MD  valsartan (DIOVAN) 320 MG tablet Take 1 tablet (320 mg total) by mouth daily. 10/30/13  Yes Minus Breeding, MD    Physical Exam: Filed Vitals:   11/13/13 0003 11/13/13 0100 11/13/13 0144 11/13/13 0200  BP: 128/57 113/55 111/61 126/76  Pulse: 71 68 72 70  Temp:      TempSrc:      Resp: 18  20 18   SpO2: 99% 97% 100% 99%   General: Not in acute distress HEENT:       Eyes: PERRL, EOMI, no scleral icterus       ENT: No discharge from the ears and nose, no pharynx injection, no tonsillar enlargement.        Neck: No JVD, no bruit, no mass felt. Cardiac: S1/S2, RRR, No murmurs, gallops or rubs Pulm: Good air movement bilaterally. Clear to auscultation bilaterally. No rales, wheezing, rhonchi or rubs. Abd: Soft, nondistended, nontender, no rebound pain, no organomegaly, BS present Ext: No edema. 2+DP/PT pulse  bilaterally Musculoskeletal: No joint deformities, erythema, or stiffness, ROM full Skin: No rashes.  Neuro: Alert and oriented X3, cranial nerves II-XII grossly intact, muscle strength 5/5 in all extremeties, sensation to light touch intact. Brachial reflex 2+ bilaterally. Knee reflex 1+ bilaterally. Negative Babinski's sign. Normal finger to nose test. Psych: Patient is not psychotic, no suicidal or hemocidal ideation.  Labs on Admission:  Basic Metabolic Panel:  Recent Labs Lab 11/12/13 2150  NA 139  K 4.0  CL 99  CO2 24  GLUCOSE 138*  BUN 15  CREATININE 0.57  CALCIUM 9.6  MG 1.8   Liver Function Tests:  Recent Labs Lab 11/12/13 2150  AST 25  ALT 23  ALKPHOS 73  BILITOT 0.2*  PROT 7.6  ALBUMIN 3.5    Recent Labs Lab 11/12/13 2150  LIPASE 43   No results found for this basename: AMMONIA,  in the last 168 hours CBC:  Recent Labs Lab 11/12/13 2150  WBC 7.6  NEUTROABS 4.1  HGB 10.9*  HCT 33.0*  MCV 76.2*  PLT 232   Cardiac Enzymes:  Recent Labs Lab 11/12/13 2150 11/13/13  TROPONINI <0.30 0.39*    BNP (last 3 results) No results found for this basename: PROBNP,  in the last 8760 hours CBG:  Recent Labs Lab 11/13/13 0150  GLUCAP 98    Radiological Exams on Admission: Dg Chest 2 View  11/12/2013   CLINICAL DATA:  Acute onset of diffuse chest pain and shortness breath. Diffuse left-sided numbness. Current history of hypertension and diabetes.  EXAM: CHEST  2 VIEW  COMPARISON:  Chest radiograph performed 07/13/2012  FINDINGS: The lungs are well-aerated and clear. There is no evidence of focal opacification, pleural effusion or pneumothorax.  The heart is normal in size; the mediastinal contour is within normal limits. No acute osseous abnormalities are seen. Clips are noted within the right upper quadrant, reflecting prior cholecystectomy.  IMPRESSION: No acute cardiopulmonary process seen.   Electronically Signed   By: Garald Balding M.D.   On:  11/12/2013 22:41   Ct Head Wo Contrast  11/12/2013   CLINICAL DATA:  Acute onset of paresthesias. Supraventricular tachycardia earlier tonight. Acute onset of headache. Initial encounter.  EXAM: CT HEAD WITHOUT CONTRAST  TECHNIQUE: Contiguous axial images were obtained from the base of the skull through the vertex without intravenous contrast.  COMPARISON:  CT of the head, and MRI of the brain performed 01/16/2011  FINDINGS: There is no evidence of acute infarction, mass lesion, or intra- or extra-axial hemorrhage on CT.  Prominence of the sulci reflects mild cortical volume loss. 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  The posterior fossa, including the cerebellum, brainstem and fourth ventricle, is within normal limits. The third and lateral ventricles, and basal ganglia are unremarkable in appearance. The cerebral hemispheres are symmetric in appearance, with normal gray-white differentiation. No mass effect or midline shift is seen.  There is no evidence of fracture; visualized osseous structures are unremarkable in appearance. The orbits are within normal limits. The paranasal sinuses and mastoid air cells are well-aerated. No significant soft tissue abnormalities are seen.  IMPRESSION: 1. No acute intracranial pathology seen on CT. 2. Mild cortical volume loss noted.   Electronically Signed   By: Garald Balding M.D.   On: 11/12/2013 22:23    EKG: Independently reviewed. First   Assessment/Plan Principal Problem:   Chest pain Active Problems:   DM (diabetes mellitus), type 2   Essential hypertension   Gastroparesis   EPIGASTRIC PAIN chronic   TIA (transient ischemic attack)   Elevated troponin   SVT (supraventricular tachycardia)   1. Tachycardia: patient has hx of SVT which is likely reoccurred. The triggering factor is not clear, but it is important to rule out ACS given her atypical chest pain. She has elevated trop 0.39. ED discussed with Dr.McLean who thought it was most likely due  to demanding ischemia secondary to tachycardia, and suggested to continue to cycle trop. - will admit to tele bed - cycle CE q6 x3 and repeat her EKG in the am  - Nitroglycerin, aspirin, Statin, BB - repeat 2D echo  2. Hx of TIA:  patient could have another TIA episode 2 days ago. ED discussed with the Dr. Aram Beecham, who felt that there was no need to do further workup, since patient was already on statin and aspirin - will continue ASA and statin  3. HTN: bp is well controlled. - Continue home medications: Amlodipine, metoprolol, Irbesartan  4. DM-II: last A1c was 6.9 on 10/17/13 - will continue home metformin  5. GERD: continue PPI   DVT ppx: SQ Heparin   Code Status: Full code Family Communication: None at bed side.   Disposition Plan: Admit to inpatient  Ivor Costa [facility] Pager [phone]  If 7PM-7AM, please contact night-coverage [email or web address] Password [email or web address] 11/13/2013, 2:27 AM

## 2013-11-13 NOTE — ED Provider Notes (Signed)
Medical screening examination/treatment/procedure(s) were conducted as a shared visit with non-physician practitioner(s) and myself.  I personally evaluated the patient during the encounter.  Episode of palpitations associated with epigastric pain and nausea. Last one hour. Now resolved. History of SVT in the past. Improved with taking metoprolol and putting ice on face. EMS rhythm strip does not shows junctional tachycardia with ST depressions anteriorly. Feels back to baseline now. Normal sinus rhythm. Second troponin 0.39. Likely leak from tachyarrhythmia. Discussed with Dr. Aundra Dubin. He feels the patient's risk factors she should be admitted for further trending of her troponin especially in setting of paresthesias that she had yesterday. ASA given.  No heparin at this time ACS seems less likely.    EKG Interpretation   Date/Time:  Wednesday November 12 2013 20:35:03 EDT Ventricular Rate:  82 PR Interval:  203 QRS Duration: 69 QT Interval:  354 QTC Calculation: 413 R Axis:   24 Text Interpretation:  Sinus rhythm Low voltage, precordial leads Abnormal  R-wave progression, early transition No significant change was found  Confirmed by Wyvonnia Dusky  MD, Maecy Podgurski [phone]) on 11/12/2013 8:50:55 PM        Ezequiel Essex, MD 11/13/13 0947

## 2013-11-13 NOTE — Progress Notes (Signed)
Pt ready for discharge. Pt and family member present for instructions. New Prescription given, and questions answered. Pt Stable. All belongings with pt.  Etta Quill, RN

## 2013-11-13 NOTE — Discharge Summary (Signed)
Physician Discharge Summary  [name] XTK:[id number] DOB: January 06, 1941 DOA: 11/12/2013  PCP: Penni Homans, MD  Admit date: 11/12/2013 Discharge date: 11/13/2013  Time spent: 25 minutes  Recommendations for Outpatient Follow-up:  Home with outpt follow up with cardiologist on 11/18/2013  Discharge Diagnoses:  Principal Problem:   SVT (supraventricular tachycardia)   Active Problems:   DM (diabetes mellitus), type 2   Essential hypertension   Gastroparesis   Elevated troponin   Chest pain   Discharge Condition: fair  Diet recommendation: cardiac  Filed Weights   11/13/13 0303 11/13/13 9924  Weight: 79.878 kg (176 lb 1.6 oz) 79.334 kg (174 lb 14.4 oz)    History of present illness:  73 y.o. female with PMHx significant for SVT, DM-II, HTN, HLD, TIA, gastroparesis, GERD, who presents with palpitations,  epigastric and chest pain.  Patient states that she started having palpitation at about 7:00 PM. She felt her symptoms to be like her previous SVTs. She called EMS, and at the same time, she attempted vagal maneuvers by putting ice on her head and face, and took her ASA and metoprolol, which resolved her symptoms by the time EMS arrived. Patient also complained of mild chest pain and epigastric pain. She does not have fever, chills, cough, shortness of breath. No nausea, vomiting or diarrhea.  Patient also reports that she had numbness over her left arm and leg two days ago, which resolved spontaneously  the next day.  Of note, patient had recent carotid artery studies done last month with 1-39% bilateral ICA stenosis.  The patient was found to have a first degree AV block on EKG, and elevated troponin 0.39. She was admitted to Telemetry on observation   Hospital Course:  Chest pain with tachycardia Likely symptoms of her underlying SVTs. No clear triggering factor. Patient noted to have 1 set of elevated troponin of 0.39. Seen by cardiology and thinks this is demand  ischemia. Subsequent troponin and EKG have been unremarkable. Patient has been stable on telemetry without further symptoms. -Cardiology recommends increasing her metoprolol dose to 50 mg twice daily. Do not recommend further Cardiac intervention. Patient had a normal 2-D echo 1 year back and does not need to be repeated at this time. -She has a followup appointment with her cardiologist Dr Darral Dash in 5 days.  History of TIA Patient reportedly had another TIA episode 2 days back. ED physician discussed with the neurohospitalist Dr Aram Beecham Who recommended no further workup him to continue aspirin and statin  Hypertension Blood pressure well controlled. Continue amlodipine, metoprolol and irbesartan  Type 2 diabetes mellitus Continue metformin.  GERD Continue PPI  Procedures:  None  Consultations:  Cardiology  Discharge Exam: Filed Vitals:   11/13/13 0954  BP: 128/70  Pulse: 70  Temp:   Resp:     General: Elderly female in no acute distress HEENT: Moist oral mucosa Chest: Clear to auscultation bilaterally CVS: Normal S1 and S2, no murmurs rub or gallop Abdomen: Soft, nontender, non distended,  Extremities: Warm, no edema and  Discharge Instructions You were cared for by a hospitalist during your hospital stay. If you have any questions about your discharge medications or the care you received while you were in the hospital after you are discharged, you can call the unit and asked to speak with the hospitalist on call if the hospitalist that took care of you is not available. Once you are discharged, your primary care physician will handle any further medical issues. Please note that  NO REFILLS for any discharge medications will be authorized once you are discharged, as it is imperative that you return to your primary care physician (or establish a relationship with a primary care physician if you do not have one) for your aftercare needs so that they can reassess your need  for medications and monitor your lab values.   Current Discharge Medication List    CONTINUE these medications which have CHANGED   Details  metoprolol tartrate (LOPRESSOR) 25 MG tablet Take 2 tablet (50 mg total) by mouth 2 (two) times   daily. Qty: 180 tablet, Refills: 0      CONTINUE these medications which have NOT CHANGED   Details  acetaminophen (TYLENOL) 500 MG tablet Take 2 tablets (1,000 mg total) by mouth every 6 (six) hours as needed for moderate pain. Qty: 30 tablet, Refills: 0    ALPRAZolam (XANAX) 0.25 MG tablet Take 1 tablet (0.25 mg total) by mouth 3 (three) times daily as needed for anxiety. Qty: 30 tablet, Refills: 0    amLODipine (NORVASC) 5 MG tablet Take 5 mg by mouth at bedtime.    aspirin 81 MG tablet Take 81 mg by mouth daily.    cholecalciferol (VITAMIN D) 1000 UNITS tablet Take 1,000 Units by mouth daily.      dicyclomine (BENTYL) 10 MG capsule Take 1 capsule (10 mg total) by mouth every 6 (six) hours as needed (abdominal pain- may take 2 capsules if 1 ineffective). Qty: 90 capsule, Refills: 5    esomeprazole (NEXIUM) 40 MG capsule Take 1 capsule (40 mg total) by mouth daily before breakfast. Qty: 90 capsule, Refills: 3    folic acid-pyridoxine-cyancobalamin (FOLBIC) 2.5-25-2 MG TABS Take 1 tablet by mouth daily. Qty: 90 tablet, Refills: 1    metFORMIN (GLUCOPHAGE-XR) 500 MG 24 hr tablet Take 2 tablets (1,000 mg total) by mouth 2 (two) times daily. Qty: 180 tablet, Refills: 1    metoCLOPramide (REGLAN) 5 MG tablet Take 5 mg by mouth 2 (two) times daily.    Multiple Vitamin (MULTIVITAMIN) tablet Take 1 tablet by mouth daily.      ondansetron (ZOFRAN) 4 MG tablet Take 4 mg by mouth every 4 (four) hours as needed. For nausea      potassium chloride (K-DUR,KLOR-CON) 10 MEQ tablet Take 1 tablet (10 mEq total) by mouth daily. Qty: 90 tablet, Refills: 3   Associated Diagnoses: Hypokalemia    Propylene Glycol (SYSTANE BALANCE OP) Apply 1 drop to eye 2  (two) times daily.    rosuvastatin (CRESTOR) 10 MG tablet Take 1 tablet (10 mg total) by mouth daily. Qty: 30 tablet, Refills: 11   Associated Diagnoses: Pure hypercholesterolemia    valsartan (DIOVAN) 320 MG tablet Take 1 tablet (320 mg total) by mouth daily. Qty: 30 tablet, Refills: 0       Allergies  Allergen Reactions  . Tramadol     Dizziness    Follow-up Information   Follow up with Penni Homans, MD. Schedule an appointment as soon as possible for a visit in 2 days. (As needed)    Specialty:  Family Medicine   Contact information:   [address] [place] [address] [phone]       Follow up with Minus Breeding, MD On 11/18/2013. (1:45 pm)    Specialty:  Cardiology   Contact information:   [address] [place] [address] [phone]        The results of significant diagnostics from this hospitalization (  including imaging, microbiology, ancillary and laboratory) are listed below for reference.    Significant Diagnostic Studies: Dg Chest 2 View  11/12/2013   CLINICAL DATA:  Acute onset of diffuse chest pain and shortness breath. Diffuse left-sided numbness. Current history of hypertension and diabetes.  EXAM: CHEST  2 VIEW  COMPARISON:  Chest radiograph performed 07/13/2012  FINDINGS: The lungs are well-aerated and clear. There is no evidence of focal opacification, pleural effusion or pneumothorax.  The heart is normal in size; the mediastinal contour is within normal limits. No acute osseous abnormalities are seen. Clips are noted within the right upper quadrant, reflecting prior cholecystectomy.  IMPRESSION: No acute cardiopulmonary process seen.   Electronically Signed   By: Garald Balding M.D.   On: 11/12/2013 22:41   Dg Cervical Spine Complete  10/20/2013   CLINICAL DATA:  Left neck pain radiating down the left arm.  EXAM: CERVICAL SPINE  4+ VIEWS  COMPARISON:  None.  FINDINGS: No fracture. 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 Remaining disc spaces are well preserved. No spondylolisthesis.  Facet and uncovertebral spurring on the left causes moderate to severe neural foraminal narrowing at C3-C4-C4-C5. Remaining neural foramina are well preserved.  Soft tissues are unremarkable.  IMPRESSION: 1. No fracture or acute finding. 2. There is significant neural foraminal narrowing on the left at C3-C4 and C4-C5, moderate to severe. This may lead to impingement of the exiting C3 or C4 or both nerve roots.   Electronically Signed   By: Lajean Manes M.D.   On: 10/20/2013 15:48   Ct Head Wo Contrast  11/12/2013   CLINICAL DATA:  Acute onset of paresthesias. Supraventricular tachycardia earlier tonight. Acute onset of headache. Initial encounter.  EXAM: CT HEAD WITHOUT CONTRAST  TECHNIQUE: Contiguous axial images were obtained from the base of the skull through the vertex without intravenous contrast.  COMPARISON:  CT of the head, and MRI of the brain performed 01/16/2011  FINDINGS: There is no evidence of acute infarction, mass lesion, or intra- or extra-axial hemorrhage on CT.  Prominence of the sulci reflects mild cortical volume loss. 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  The posterior fossa, including the cerebellum, brainstem and fourth ventricle, is within normal limits. The third and lateral ventricles, and basal ganglia are unremarkable in appearance. The cerebral hemispheres are symmetric in appearance, with normal gray-white differentiation. No mass effect or midline shift is seen.  There is no evidence of fracture; visualized osseous structures are unremarkable in appearance. The orbits are within normal limits. The paranasal sinuses and mastoid air cells are well-aerated. No significant soft tissue abnormalities are seen.  IMPRESSION: 1. No acute intracranial pathology seen on CT. 2. Mild cortical volume loss noted.   Electronically Signed   By: Garald Balding M.D.   On: 11/12/2013 22:23     Microbiology: No results found for this or any previous visit (from the past 240 hour(s)).   Labs: Basic Metabolic Panel:  Recent Labs Lab 11/12/13 2150 11/13/13 0250  NA 139 140  K 4.0 3.9  CL 99 103  CO2 24 21  GLUCOSE 138* 162*  BUN 15 12  CREATININE 0.57 0.62  CALCIUM 9.6 9.2  MG 1.8  --    Liver Function Tests:  Recent Labs Lab 11/12/13 2150  AST 25  ALT 23  ALKPHOS 73  BILITOT 0.2*  PROT 7.6  ALBUMIN 3.5    Recent Labs Lab 11/12/13 2150  LIPASE 43  No results found for this basename: AMMONIA,  in the last 168 hours CBC:  Recent Labs Lab 11/12/13 2150  WBC 7.6  NEUTROABS 4.1  HGB 10.9*  HCT 33.0*  MCV 76.2*  PLT 232   Cardiac Enzymes:  Recent Labs Lab 11/12/13 2150 11/13/13 11/13/13 0250 11/13/13 1050  TROPONINI <0.30 0.39* <0.30 <0.30   BNP: BNP (last 3 results) No results found for this basename: PROBNP,  in the last 8760 hours CBG:  Recent Labs Lab 11/13/13 0150 11/13/13 0329 11/13/13 0812 11/13/13 1130  GLUCAP 98 161* 108* 152*       Signed:  Najwa Spillane  [facility] 11/13/2013, 1:10 PM

## 2013-11-13 NOTE — Progress Notes (Signed)
&  50vyear old female received from [facility] ED with dx of chest pain . Pt denied chest pain or any c/o upon arrival to unit via stretcher. Assisted to bed and cardiac monitor applied per order. Initial assessment ,done orders noted . Call light within pt reach to call for assist as needed. Will continue to monitor.

## 2013-11-13 NOTE — Progress Notes (Signed)
UR completed 

## 2013-11-13 NOTE — Consult Note (Signed)
CONSULT NOTE  Date: 11/13/2013               Patient Name:  [name] MRN: [id number]  DOB: 10/12/1940 Age / Sex: 73 y.o., female        PCP: Penni Homans Primary Cardiologist: Hochrein            Referring Physician: Dhungel              Reason for Consult: CP, SVT            History of Present Illness: Patient is a 73 y.o. female with a PMHx of supraventricular tachycardia, type 2 diabetes mellitus, hypertension, hyperlipidemia, TIA, gastroesophageal reflux disease, who was admitted to [facility] on 11/12/2013 for evaluation of increased palpitations and chest pain.  The patient started having palpitations around 7 PM last night. She has a history of supraventricular tachycardia. EMS was called..   The patient apparently converted to sinus rhythm after vagal maneuvers.  She did not require adenosine.  Her initial troponin was 0.39. Subsequent troponin is less than 0.30 .  She feels much better today. She d enied any recurrent episodes of chest discomfort.   she has already eaten breakfast.   Medications: Outpatient medications: Prescriptions prior to admission  Medication Sig Dispense Refill  . acetaminophen (TYLENOL) 500 MG tablet Take 2 tablets (1,000 mg total) by mouth every 6 (six) hours as needed for moderate pain.  30 tablet  0  . ALPRAZolam (XANAX) 0.25 MG tablet Take 1 tablet (0.25 mg total) by mouth 3 (three) times daily as needed for anxiety.  30 tablet  0  . amLODipine (NORVASC) 5 MG tablet Take 5 mg by mouth at bedtime.      Marland Kitchen aspirin 81 MG tablet Take 81 mg by mouth daily.      . cholecalciferol (VITAMIN D) 1000 UNITS tablet Take 1,000 Units by mouth daily.        Marland Kitchen dicyclomine (BENTYL) 10 MG capsule Take 1 capsule (10 mg total) by mouth every 6 (six) hours as needed (abdominal pain- may take 2 capsules if 1 ineffective).  90 capsule  5  . esomeprazole (NEXIUM) 40 MG capsule Take 1 capsule (40 mg total) by mouth daily before breakfast.  90 capsule  3    . folic acid-pyridoxine-cyancobalamin (FOLBIC) 2.5-25-2 MG TABS Take 1 tablet by mouth daily.  90 tablet  1  . metFORMIN (GLUCOPHAGE-XR) 500 MG 24 hr tablet Take 2 tablets (1,000 mg total) by mouth 2 (two) times daily.  180 tablet  1  . metoCLOPramide (REGLAN) 5 MG tablet Take 5 mg by mouth 2 (two) times daily.      . metoprolol tartrate (LOPRESSOR) 25 MG tablet Take 1 tablet (25 mg total) by mouth 2 (two) times   daily.  180 tablet  0  . Multiple Vitamin (MULTIVITAMIN) tablet Take 1 tablet by mouth daily.        . ondansetron (ZOFRAN) 4 MG tablet Take 4 mg by mouth every 4 (four) hours as needed. For nausea        . potassium chloride (K-DUR,KLOR-CON) 10 MEQ tablet Take 1 tablet (10 mEq total) by mouth daily.  90 tablet  3  . Propylene Glycol (SYSTANE BALANCE OP) Apply 1 drop to eye 2 (two) times daily.      . rosuvastatin (CRESTOR) 10 MG tablet Take 1 tablet (10 mg total) by mouth daily.  30 tablet  11  . valsartan (DIOVAN) 320  MG tablet Take 1 tablet (320 mg total) by mouth daily.  30 tablet  0    Current medications: Current Facility-Administered Medications  Medication Dose Route Frequency Provider Last Rate Last Dose  . acetaminophen (TYLENOL) tablet 650 mg  650 mg Oral Q6H PRN Ivor Costa, MD      . ALPRAZolam Duanne Moron) tablet 0.25 mg  0.25 mg Oral TID PRN Ivor Costa, MD      . amLODipine (NORVASC) tablet 5 mg  5 mg Oral QHS Ivor Costa, MD      . aspirin EC tablet 81 mg  81 mg Oral Daily Ivor Costa, MD      . cholecalciferol (VITAMIN D) tablet 1,000 Units  1,000 Units Oral Daily Ivor Costa, MD      . dicyclomine (BENTYL) capsule 10-20 mg  10-20 mg Oral Q6H PRN Ivor Costa, MD      . folic acid-pyridoxine-cyancobalamin (FOLTX) 2.5-25-2 MG per tablet 1 tablet  1 tablet Oral Daily Ivor Costa, MD      . heparin injection 5,000 Units  5,000 Units Subcutaneous 3 times per day Ivor Costa, MD      . irbesartan (AVAPRO) tablet 300 mg  300 mg Oral Daily Ivor Costa, MD      . metFORMIN (GLUCOPHAGE-XR) 24  hr tablet 1,000 mg  1,000 mg Oral BID WC Ivor Costa, MD      . metoCLOPramide (REGLAN) tablet 5 mg  5 mg Oral BID WC Ivor Costa, MD      . metoprolol tartrate (LOPRESSOR) tablet 25 mg  25 mg Oral BID Ivor Costa, MD      . multivitamin with minerals tablet 1 tablet  1 tablet Oral Daily Ivor Costa, MD      . pantoprazole (PROTONIX) EC tablet 40 mg  40 mg Oral Daily Ivor Costa, MD      . polyvinyl alcohol (LIQUIFILM TEARS) 1.4 % ophthalmic solution 1 drop  1 drop Both Eyes Q breakfast Ivor Costa, MD      . potassium chloride (K-DUR,KLOR-CON) CR tablet 10 mEq  10 mEq Oral Daily Ivor Costa, MD      . rosuvastatin (CRESTOR) tablet 10 mg  10 mg Oral Daily Ivor Costa, MD      . sodium chloride 0.9 % injection 3 mL  3 mL Intravenous Q12H Ivor Costa, MD   3 mL at 11/13/13 0263     Allergies  Allergen Reactions  . Tramadol     Dizziness      Past Medical History  Diagnosis Date  . Gastroparesis   . Pure hypercholesterolemia   . Headache(784.0)   . Unspecified essential hypertension   . Esophageal reflux   . Type II or unspecified type diabetes mellitus without mention of complication, not stated as uncontrolled   . Arthritis     Spinal Osteoarthritis  . Diverticulosis 08/30/2000    Colonoscopy   . Gastric polyp     Fundic Gland  . Anxiety   . Cataract   . Heart murmur     Echocardiogram 2/11: EF 60-65%, mild LAE, grade 1 diastolic dysfunction, aortic valve sclerosis, mean gradient 9 mm of mercury, PASP 34  . Chest pain     Myoview 1/11: EF 82%, no ischemia.  . Carcinoid tumor of stomach   . PSVT (paroxysmal supraventricular tachycardia)   . Leg swelling     bilateral  . Chronic kidney disease     Left kidney smaller than right kidney  . Cancer   .  PONV (postoperative nausea and vomiting)     pt states only needs small amount of anesthesia  . Stroke     tia, 2014  . Iron deficiency anemia, unspecified   . Encounter for preventative adult health care exam with abnormal findings 09/14/2013   . Unspecified hereditary and idiopathic peripheral neuropathy 10/29/2013  . Diabetic peripheral neuropathy 10/29/2013    Past Surgical History  Procedure Laterality Date  . Cholecystectomy  1993  . Tonsillectomy    . Colonoscopy  11/11/2010    diverticulosis  . Esophagogastroduodenoscopy  08/29/2010; 09/15/2010    Carcinoid tumor less than 1 cm in July 2012 not seen in August 2012 , gastritis, fundic gland polyps  . Eus  12/15/2010    Procedure: UPPER ENDOSCOPIC ULTRASOUND (EUS) LINEAR;  Surgeon: Owens Loffler, MD;  Location: [facility];  Service: Endoscopy;  Laterality: N/A;  . Esophagogastroduodenoscopy  05/16/2011  . Esophagogastroduodenoscopy  06/14/2012  . Dilatation & currettage/hysteroscopy with resectocope N/A 02/25/2013    Procedure: Attempted hysteroscopy with uterine perforation;  Surgeon: Jamey Reas de Berton Lan, MD;  Location: [facility];  Service: Gynecology;  Laterality: N/A;  . Laparoscopy N/A 02/25/2013    Procedure: Cystoscopy and laparoscopy with fulguration of uterine serosa;  Surgeon: Jamey Reas de Berton Lan, MD;  Location: [facility];  Service: Gynecology;  Laterality: N/A;    Family History  Problem Relation Age of Onset  . Diabetes Mother   . Stroke Father     deceased age 73  . Heart disease Sister     deceased MI age 47  . Heart disease Brother     deceased MI age 59  . Colon cancer Neg Hx   . Esophageal cancer Neg Hx   . Stomach cancer Neg Hx   . Rectal cancer Neg Hx   . Diabetes Maternal Grandmother   . Hypertension Paternal Grandmother   . Diabetes Sister   . Heart disease Sister   . Hypertension Sister   . Hyperlipidemia Sister   . Diabetes Sister   . Heart disease Sister   . Hypertension Sister   . Hyperlipidemia Sister   . Diabetes Brother   . Heart disease Brother   . Hypertension Brother   . Hyperlipidemia Brother     Social History:  reports that she has never smoked. She has never used smokeless tobacco. She reports that  she does not drink alcohol or use illicit drugs.   Review of Systems: Constitutional:  denies fever, chills, diaphoresis, appetite change and fatigue.  HEENT: denies photophobia, eye pain, redness, hearing loss, ear pain, congestion, sore throat, rhinorrhea, sneezing, neck pain, neck stiffness and tinnitus.  Respiratory: denies SOB, DOE, cough, chest tightness, and wheezing.  Cardiovascular: admits to chest pain and palpitations / tachycardia  - denies leg swelling.  Gastrointestinal: denies nausea, vomiting, abdominal pain, diarrhea, constipation, blood in stool.  Genitourinary: denies dysuria, urgency, frequency, hematuria, flank pain and difficulty urinating.  Musculoskeletal: denies  myalgias, back pain, joint swelling, arthralgias and gait problem.   Skin: denies pallor, rash and wound.  Neurological: denies dizziness, seizures, syncope, weakness, light-headedness, numbness and headaches.   Hematological: denies adenopathy, easy bruising, personal or family bleeding history.  Psychiatric/ Behavioral: denies suicidal ideation, mood changes, confusion, nervousness, sleep disturbance and agitation.    Physical Exam: BP 138/63  Pulse 69  Temp(Src) 98.1 F (36.7 C) (Oral)  Resp 18  Ht 5\' 1"  (1.549 m)  Wt 174 lb 14.4 oz (79.334 kg)  BMI  33.06 kg/m2  SpO2 97%  LMP 02/07/1992  Wt Readings from Last 3 Encounters:  11/13/13 174 lb 14.4 oz (79.334 kg)  10/20/13 176 lb 6.4 oz (80.015 kg)  09/25/13 176 lb 12 oz (80.173 kg)    General: Vital signs reviewed and noted. Well-developed, well-nourished, in no acute distress; alert,   Head: Normocephalic, atraumatic, sclera anicteric,   Neck: Supple. Negative for carotid bruits. No JVD   Lungs:  Clear bilaterally, no  wheezes, rales, or rhonchi. Breathing is normal   Heart: RRR with S1 S2. No murmurs, rubs, or gallops   Abdomen:  Soft, non-tender, non-distended with normoactive bowel sounds. No hepatomegaly. No rebound/guarding. No obvious  abdominal masses   MSK: Strength and the appear normal for age.   Extremities: No clubbing or cyanosis. No edema.  Distal pedal pulses are 2+ and equal  Neurologic: Alert and oriented X 3. Moves all extremities spontaneously.  Psych: Responds to questions appropriately with a normal affect.     Lab results: Basic Metabolic Panel:  Recent Labs Lab 11/12/13 2150 11/13/13 0250  NA 139 140  K 4.0 3.9  CL 99 103  CO2 24 21  GLUCOSE 138* 162*  BUN 15 12  CREATININE 0.57 0.62  CALCIUM 9.6 9.2  MG 1.8  --     Liver Function Tests:  Recent Labs Lab 11/12/13 2150  AST 25  ALT 23  ALKPHOS 73  BILITOT 0.2*  PROT 7.6  ALBUMIN 3.5    Recent Labs Lab 11/12/13 2150  LIPASE 43   No results found for this basename: AMMONIA,  in the last 168 hours  CBC:  Recent Labs Lab 11/12/13 2150  WBC 7.6  NEUTROABS 4.1  HGB 10.9*  HCT 33.0*  MCV 76.2*  PLT 232    Cardiac Enzymes:  Recent Labs Lab 11/12/13 2150 11/13/13 11/13/13 0250  TROPONINI <0.30 0.39* <0.30    BNP: No components found with this basename: POCBNP,   CBG:  Recent Labs Lab 11/13/13 0150 11/13/13 0329 11/13/13 0812  GLUCAP 98 161* 108*    Coagulation Studies: No results found for this basename: LABPROT, INR,  in the last 72 hours   Other results: EKG normal sinus rhythm. She has no ST or T wave changes.  Imaging: Dg Chest 2 View  11/12/2013   CLINICAL DATA:  Acute onset of diffuse chest pain and shortness breath. Diffuse left-sided numbness. Current history of hypertension and diabetes.  EXAM: CHEST  2 VIEW  COMPARISON:  Chest radiograph performed 07/13/2012  FINDINGS: The lungs are well-aerated and clear. There is no evidence of focal opacification, pleural effusion or pneumothorax.  The heart is normal in size; the mediastinal contour is within normal limits. No acute osseous abnormalities are seen. Clips are noted within the right upper quadrant, reflecting prior cholecystectomy.   IMPRESSION: No acute cardiopulmonary process seen.   Electronically Signed   By: Garald Balding M.D.   On: 11/12/2013 22:41   Ct Head Wo Contrast  11/12/2013   CLINICAL DATA:  Acute onset of paresthesias. Supraventricular tachycardia earlier tonight. Acute onset of headache. Initial encounter.  EXAM: CT HEAD WITHOUT CONTRAST  TECHNIQUE: Contiguous axial images were obtained from the base of the skull through the vertex without intravenous contrast.  COMPARISON:  CT of the head, and MRI of the brain performed 01/16/2011  FINDINGS: There is no evidence of acute infarction, mass lesion, or intra- or extra-axial hemorrhage on CT.  Prominence of the sulci reflects mild cortical volume loss. A  large empty sella is incidentally noted.  The posterior fossa, including the cerebellum, brainstem and fourth ventricle, is within normal limits. The third and lateral ventricles, and basal ganglia are unremarkable in appearance. The cerebral hemispheres are symmetric in appearance, with normal gray-white differentiation. No mass effect or midline shift is seen.  There is no evidence of fracture; visualized osseous structures are unremarkable in appearance. The orbits are within normal limits. The paranasal sinuses and mastoid air cells are well-aerated. No significant soft tissue abnormalities are seen.  IMPRESSION: 1. No acute intracranial pathology seen on CT. 2. Mild cortical volume loss noted.   Electronically Signed   By: Garald Balding M.D.   On: 11/12/2013 22:23       Assessment & Plan:  1. Chest pain: The patient presents with chest pain that occurred during an episode of supraventricular tachycardia. She's had similar episodes in the past. She's reportedly had stress Myoview studies in the past which did not show any ischemia.  I was unable to find the reports in the computer.   The nature of her chest pain sounds like this was due to demand ischemia. The fact that her to other levels have decreased suggests  that this was not an acute plaque rupture.  She already has an appointment with Dr. Percival Spanish next Tuesday. We can consider a myoivew as OP if she has any recurrent CP.  I've advised her to walk around the unit today. If she is able to ambulate without significant difficulties or tachycardia then she should be be discharged either later today or perhaps tomorrow. We will increase her metoprolol to 50 mg twice a day.   She had an echo last year that revealed normal left ventricular systolic function with an ejection fraction of 55-60%. She is trivial aortic insufficiency, mild mitral regurgitation. I do not think that she needs another echocardiogram today. I have canceled the study that was ordered.  2 . History of TIA: Patient has had a fairly thorough workup. Her carotid arteries have mild plaque. Her echo was unremarkable.    Thayer Headings, Brooke Bonito., MD, [facility] 11/13/2013, 8:46 AM Office - [phone] Pager [phone]

## 2013-11-14 ENCOUNTER — Telehealth: Payer: Self-pay

## 2013-11-14 NOTE — Telephone Encounter (Signed)
Admit date: 11/12/2013  Discharge date: 11/13/2013  Reason for admission:  SVT (supraventricular tachycardia)  Recommendations for Outpatient Follow-up:  Home with outpt follow up with cardiologist on 11/18/2013  Transition Care Management Follow-up Telephone Call  How have you been since you were released from the hospital?  Pt states she feels tired and has numbness in her left hand. Symptoms are not severe. Denies chest pain or palpitations.     Do you understand why you were in the hospital? yes   Do you understand the discharge instructions? yes  Items Reviewed:  Medications reviewed: yes  Allergies reviewed: yes  Dietary changes reviewed: yes, cardiac diet  Referrals reviewed: yes, cardiologist   Functional Questionnaire:   Activities of Daily Living (ADLs):   She states they are independent in the following: ambulation, bathing and hygiene, feeding, continence, grooming, toileting and dressing States they require assistance with the following: none   Any transportation issues/concerns?:  no   Any patient concerns? no   Confirmed importance and date/time of follow-up visits scheduled: yes   Confirmed with patient if condition begins to worsen call PCP or go to the ER.  Patient was given the Call-a-Nurse line [phone]: yes  Hospital follow up with Elyn Aquas,  PA-C on 11/19/13 @ 11:30 am.

## 2013-11-15 LAB — URINE CULTURE

## 2013-11-18 ENCOUNTER — Ambulatory Visit: Payer: Medicare Other | Admitting: Rehabilitation

## 2013-11-18 ENCOUNTER — Encounter: Payer: Self-pay | Admitting: Cardiology

## 2013-11-18 ENCOUNTER — Ambulatory Visit (INDEPENDENT_AMBULATORY_CARE_PROVIDER_SITE_OTHER): Payer: Medicare Other | Admitting: Cardiology

## 2013-11-18 VITALS — BP 140/80 | HR 64 | Ht 61.0 in | Wt 177.0 lb

## 2013-11-18 DIAGNOSIS — I471 Supraventricular tachycardia: Secondary | ICD-10-CM | POA: Diagnosis not present

## 2013-11-18 DIAGNOSIS — G459 Transient cerebral ischemic attack, unspecified: Secondary | ICD-10-CM

## 2013-11-18 DIAGNOSIS — I6529 Occlusion and stenosis of unspecified carotid artery: Secondary | ICD-10-CM | POA: Diagnosis not present

## 2013-11-18 DIAGNOSIS — R079 Chest pain, unspecified: Secondary | ICD-10-CM | POA: Diagnosis not present

## 2013-11-18 DIAGNOSIS — I10 Essential (primary) hypertension: Secondary | ICD-10-CM

## 2013-11-18 NOTE — Progress Notes (Signed)
HPI The patient presents for followup of after a hospitalization recently for SVT.   Of note she did have a mildly elevated troponin. However, it was suggested that she did not need further cardiac evaluation. She did have her dose of beta blocker increased.  I have reviewed the hospital records.  Since going home she has had no further episodes.  The patient denies any new symptoms such as chest discomfort, neck or arm discomfort. There has been no new shortness of breath, PND or orthopnea. There have been no reported palpitations, presyncope or syncope.  She is very distraught over the death of her husband earlier this year.   Allergies  Allergen Reactions  . Tramadol     Dizziness     Current Outpatient Prescriptions  Medication Sig Dispense Refill  . acetaminophen (TYLENOL) 500 MG tablet Take 2 tablets (1,000 mg total) by mouth every 6 (six) hours as needed for moderate pain.  30 tablet  0  . ALPRAZolam (XANAX) 0.25 MG tablet Take 1 tablet (0.25 mg total) by mouth 3 (three) times daily as needed for anxiety.  30 tablet  0  . amLODipine (NORVASC) 5 MG tablet Take 5 mg by mouth at bedtime.      Marland Kitchen aspirin 81 MG tablet Take 81 mg by mouth daily.      . calcium-vitamin D (OSCAL WITH D) 500-200 MG-UNIT per tablet Take 1 tablet by mouth.      . cholecalciferol (VITAMIN D) 1000 UNITS tablet Take 1,000 Units by mouth daily.        Marland Kitchen dicyclomine (BENTYL) 10 MG capsule Take 1 capsule (10 mg total) by mouth every 6 (six) hours as needed (abdominal pain- may take 2 capsules if 1 ineffective).  90 capsule  5  . esomeprazole (NEXIUM) 40 MG capsule Take 1 capsule (40 mg total) by mouth daily before breakfast.  90 capsule  3  . folic acid-pyridoxine-cyancobalamin (FOLBIC) 2.5-25-2 MG TABS Take 1 tablet by mouth daily.  90 tablet  1  . metFORMIN (GLUCOPHAGE-XR) 500 MG 24 hr tablet Take 2 tablets (1,000 mg total) by mouth 2 (two) times daily.  180 tablet  1  . metoCLOPramide (REGLAN) 5 MG tablet Take 5 mg  by mouth 2 (two) times daily.      . metoprolol tartrate (LOPRESSOR) 25 MG tablet Take 2 tablet (50 mg total) by mouth 2 (two) times   daily.  180 tablet  0  . Multiple Vitamin (MULTIVITAMIN) tablet Take 1 tablet by mouth daily.        . ondansetron (ZOFRAN) 4 MG tablet Take 4 mg by mouth every 4 (four) hours as needed. For nausea        . potassium chloride (K-DUR,KLOR-CON) 10 MEQ tablet Take 1 tablet (10 mEq total) by mouth daily.  90 tablet  3  . Propylene Glycol (SYSTANE BALANCE OP) Apply 1 drop to eye 2 (two) times daily.      . rosuvastatin (CRESTOR) 10 MG tablet Take 1 tablet (10 mg total) by mouth daily.  30 tablet  11  . valsartan (DIOVAN) 320 MG tablet Take 1 tablet (320 mg total) by mouth daily.  30 tablet  0   No current facility-administered medications for this visit.    Past Medical History  Diagnosis Date  . Gastroparesis   . Pure hypercholesterolemia   . Headache(784.0)   . Unspecified essential hypertension   . Esophageal reflux   . Type II or unspecified type diabetes mellitus without  mention of complication, not stated as uncontrolled   . Arthritis     Spinal Osteoarthritis  . Diverticulosis 08/30/2000    Colonoscopy   . Gastric polyp     Fundic Gland  . Anxiety   . Cataract   . Heart murmur     Echocardiogram 2/11: EF 60-65%, mild LAE, grade 1 diastolic dysfunction, aortic valve sclerosis, mean gradient 9 mm of mercury, PASP 34  . Chest pain     Myoview 1/11: EF 82%, no ischemia.  . Carcinoid tumor of stomach   . PSVT (paroxysmal supraventricular tachycardia)   . Leg swelling     bilateral  . Chronic kidney disease     Left kidney smaller than right kidney  . Cancer   . PONV (postoperative nausea and vomiting)     pt states only needs small amount of anesthesia  . Stroke     tia, 2014  . Iron deficiency anemia, unspecified   . Encounter for preventative adult health care exam with abnormal findings 09/14/2013  . Unspecified hereditary and idiopathic  peripheral neuropathy 10/29/2013  . Diabetic peripheral neuropathy 10/29/2013    Past Surgical History  Procedure Laterality Date  . Cholecystectomy  1993  . Tonsillectomy    . Colonoscopy  11/11/2010    diverticulosis  . Esophagogastroduodenoscopy  08/29/2010; 09/15/2010    Carcinoid tumor less than 1 cm in July 2012 not seen in August 2012 , gastritis, fundic gland polyps  . Eus  12/15/2010    Procedure: UPPER ENDOSCOPIC ULTRASOUND (EUS) LINEAR;  Surgeon: Owens Loffler, MD;  Location: [facility];  Service: Endoscopy;  Laterality: N/A;  . Esophagogastroduodenoscopy  05/16/2011  . Esophagogastroduodenoscopy  06/14/2012  . Dilatation & currettage/hysteroscopy with resectocope N/A 02/25/2013    Procedure: Attempted hysteroscopy with uterine perforation;  Surgeon: Jamey Reas de Berton Lan, MD;  Location: [facility];  Service: Gynecology;  Laterality: N/A;  . Laparoscopy N/A 02/25/2013    Procedure: Cystoscopy and laparoscopy with fulguration of uterine serosa;  Surgeon: Jamey Reas de Berton Lan, MD;  Location: [facility];  Service: Gynecology;  Laterality: N/A;    ROS:  As stated in the HPI and negative for all other systems.  PHYSICAL EXAM BP 140/80  Pulse 64  Ht 5\' 1"  (1.549 m)  Wt 177 lb (80.287 kg)  BMI 33.46 kg/m2  LMP 02/07/1992 GENERAL:  Well appearing NECK:  No jugular venous distention, waveform within normal limits, carotid upstroke brisk and symmetric, no bruits, no thyromegaly LUNGS:  Clear to auscultation bilaterally BACK:  No CVA tenderness CHEST:  Unremarkable HEART:  PMI not displaced or sustained,S1 and S2 within normal limits, no S3, no S4, no clicks, no rubs, slight systolic murmur peaking and heard best at the right upper sternal border, no diastolicmurmurs ABD:  Flat, positive bowel sounds normal in frequency in pitch, no bruits, no rebound, no guarding, no midline pulsatile mass, no hepatomegaly, no splenomegaly EXT:  2 plus pulses throughout, mild  bilateral ankle edema, no cyanosis no clubbing PSYCH:  Tearful  EKG:  Sinus rhythm, rate 64, axis within normal limits, intervals within normal limits, no acute ST-T wave changes. 11/18/2013   ASSESSMENT AND PLAN  Supraventricular Tachycardia - She will continue the higher dose of beta blocker. If she has any recurrent dysrhythmias she understands that she needs to come back to the emergency room at which point we might consider ablation.  Elevated troponin - This was felt to be secondary to demand ischemia.  However, since he had chest discomfort I will go ahead and order a stress test. She said she would not be able to walk on a treadmill. Therefore, she will have a [employer]    Hypertension - The blood pressure is at target. No change in medications is indicated. We will continue with therapeutic lifestyle changes (TLC).  Dyslipidemia -  She will remain on the meds as listed.   Carotid stenosis - She has some mild plaque and will be checked again in September of 2016.  Murmur - This is mild aortic stenosis. We will follow this clinically.

## 2013-11-18 NOTE — Patient Instructions (Signed)
Your physician recommends that you schedule a follow-up appointment in: 6 months with Dr. Hochrein  We are ordering a stress test  

## 2013-11-18 NOTE — Telephone Encounter (Signed)
error 

## 2013-11-19 ENCOUNTER — Ambulatory Visit (INDEPENDENT_AMBULATORY_CARE_PROVIDER_SITE_OTHER): Payer: Medicare Other | Admitting: Physician Assistant

## 2013-11-19 ENCOUNTER — Encounter: Payer: Self-pay | Admitting: Physician Assistant

## 2013-11-19 VITALS — BP 125/68 | HR 59 | Temp 98.1°F | Resp 16 | Ht 61.0 in | Wt 174.5 lb

## 2013-11-19 DIAGNOSIS — G2581 Restless legs syndrome: Secondary | ICD-10-CM

## 2013-11-19 DIAGNOSIS — I471 Supraventricular tachycardia: Secondary | ICD-10-CM

## 2013-11-19 DIAGNOSIS — R5383 Other fatigue: Secondary | ICD-10-CM | POA: Diagnosis not present

## 2013-11-19 DIAGNOSIS — N3000 Acute cystitis without hematuria: Secondary | ICD-10-CM | POA: Diagnosis not present

## 2013-11-19 LAB — VITAMIN D 25 HYDROXY (VIT D DEFICIENCY, FRACTURES): VITD: 61.52 ng/mL (ref 30.00–100.00)

## 2013-11-19 LAB — FERRITIN: Ferritin: 4.7 ng/mL — ABNORMAL LOW (ref 10.0–291.0)

## 2013-11-19 MED ORDER — AMPICILLIN 250 MG PO CAPS: 250.0000 mg | ORAL_CAPSULE | Freq: Four times a day (QID) | ORAL | Status: DC

## 2013-11-19 NOTE — Patient Instructions (Signed)
Please take Ampicillin 4 times daily as directed with food.  Increase fluid intake.  Take a daily probiotic and AZO.  Rest.  Follow-up in 1 week.  Call if you develop worsening symptoms, fever, nausea or vomiting.  Please go to the lab for blood work to check your Vitamin D and Iron levels.  I will call you with your results.  Please follow-up with Cardiology as directed.  I hope you feel better!

## 2013-11-19 NOTE — Progress Notes (Signed)
Pre visit review using our clinic review tool, if applicable. No additional management support is needed unless otherwise documented below in the visit note/SLS  

## 2013-11-19 NOTE — Progress Notes (Signed)
Patient presents to clinic today for hospital follow-up of SVT.  Patient has already had follow-up with Cardiology who has increased her Metoprolol to 50 mg BID and set patient up for a Lexiscan Myoview. Patient denies recurrence of symptoms.  Is taking medications as directed. BP normotensive in clinic.  Patient with urine culture obtained in hospital that recently resulted + for UTI resistant to multiple antibiotics.  PCP attempted to reach patient for treatment without success.  Patient endorses suprapubic pressure and pain associated with urinary urgency and frequency.  Denies fever, chills, nausea/vomiting or flank pain.  Patient endorses crawling and uncomfortable sensation in legs bilaterally that is worse at night.  Denies history of known RLS. Does endorse prior history of iron deficiency.  Past Medical History  Diagnosis Date  . Gastroparesis   . Pure hypercholesterolemia   . Headache(784.0)   . Unspecified essential hypertension   . Esophageal reflux   . Type II or unspecified type diabetes mellitus without mention of complication, not stated as uncontrolled   . Arthritis     Spinal Osteoarthritis  . Diverticulosis 08/30/2000    Colonoscopy   . Gastric polyp     Fundic Gland  . Anxiety   . Cataract   . Heart murmur     Echocardiogram 2/11: EF 60-65%, mild LAE, grade 1 diastolic dysfunction, aortic valve sclerosis, mean gradient 9 mm of mercury, PASP 34  . Chest pain     Myoview 1/11: EF 82%, no ischemia.  . Carcinoid tumor of stomach   . PSVT (paroxysmal supraventricular tachycardia)   . Leg swelling     bilateral  . Chronic kidney disease     Left kidney smaller than right kidney  . Cancer   . PONV (postoperative nausea and vomiting)     pt states only needs small amount of anesthesia  . Stroke     tia, 2014  . Iron deficiency anemia, unspecified   . Encounter for preventative adult health care exam with abnormal findings 09/14/2013  . Unspecified hereditary and  idiopathic peripheral neuropathy 10/29/2013  . Diabetic peripheral neuropathy 10/29/2013    Current Outpatient Prescriptions on File Prior to Visit  Medication Sig Dispense Refill  . acetaminophen (TYLENOL) 500 MG tablet Take 2 tablets (1,000 mg total) by mouth every 6 (six) hours as needed for moderate pain.  30 tablet  0  . ALPRAZolam (XANAX) 0.25 MG tablet Take 1 tablet (0.25 mg total) by mouth 3 (three) times daily as needed for anxiety.  30 tablet  0  . amLODipine (NORVASC) 5 MG tablet Take 5 mg by mouth at bedtime.      Marland Kitchen aspirin 81 MG tablet Take 81 mg by mouth daily.      . calcium-vitamin D (OSCAL WITH D) 500-200 MG-UNIT per tablet Take 1 tablet by mouth.      . cholecalciferol (VITAMIN D) 1000 UNITS tablet Take 1,000 Units by mouth daily.        Marland Kitchen dicyclomine (BENTYL) 10 MG capsule Take 1 capsule (10 mg total) by mouth every 6 (six) hours as needed (abdominal pain- may take 2 capsules if 1 ineffective).  90 capsule  5  . esomeprazole (NEXIUM) 40 MG capsule Take 1 capsule (40 mg total) by mouth daily before breakfast.  90 capsule  3  . folic acid-pyridoxine-cyancobalamin (FOLBIC) 2.5-25-2 MG TABS Take 1 tablet by mouth daily.  90 tablet  1  . metFORMIN (GLUCOPHAGE-XR) 500 MG 24 hr tablet Take 2 tablets (1,000  mg total) by mouth 2 (two) times daily.  180 tablet  1  . metoCLOPramide (REGLAN) 5 MG tablet Take 5 mg by mouth 2 (two) times daily.      . metoprolol tartrate (LOPRESSOR) 25 MG tablet Take 2 tablet (50 mg total) by mouth 2 (two) times   daily.  180 tablet  0  . Multiple Vitamin (MULTIVITAMIN) tablet Take 1 tablet by mouth daily.        . ondansetron (ZOFRAN) 4 MG tablet Take 4 mg by mouth every 4 (four) hours as needed. For nausea        . potassium chloride (K-DUR,KLOR-CON) 10 MEQ tablet Take 1 tablet (10 mEq total) by mouth daily.  90 tablet  3  . Propylene Glycol (SYSTANE BALANCE OP) Apply 1 drop to eye 2 (two) times daily.      . rosuvastatin (CRESTOR) 10 MG tablet Take 1  tablet (10 mg total) by mouth daily.  30 tablet  11  . valsartan (DIOVAN) 320 MG tablet Take 1 tablet (320 mg total) by mouth daily.  30 tablet  0   No current facility-administered medications on file prior to visit.    Allergies  Allergen Reactions  . Tramadol     Dizziness     Family History  Problem Relation Age of Onset  . Diabetes Mother   . Stroke Father     deceased age 51  . Heart disease Sister     deceased MI age 82  . Heart disease Brother     deceased MI age 37  . Colon cancer Neg Hx   . Esophageal cancer Neg Hx   . Stomach cancer Neg Hx   . Rectal cancer Neg Hx   . Diabetes Maternal Grandmother   . Hypertension Paternal Grandmother   . Diabetes Sister   . Heart disease Sister   . Hypertension Sister   . Hyperlipidemia Sister   . Diabetes Sister   . Heart disease Sister   . Hypertension Sister   . Hyperlipidemia Sister   . Diabetes Brother   . Heart disease Brother   . Hypertension Brother   . Hyperlipidemia Brother     History   Social History  . Marital Status: Married    Spouse Name: N/A    Number of Children: 0  . Years of Education: college   Occupational History  . Retail    Social History Main Topics  . Smoking status: Never Smoker   . Smokeless tobacco: Never Used     Comment: Daily Caffeine Use -1  . Alcohol Use: No  . Drug Use: No  . Sexual Activity: No     Comment: lives alone, no dietary restrictions except avoid fresh veg, fruit, whole grains   Other Topics Concern  . None   Social History Narrative   Patient is married Tourist information centre manager) and lives with her husband.   Patient does not have any children.   Patient is right-handed.   Patient has a BA degree.   One caffeine drink daily     Review of Systems - See HPI.  All other ROS are negative.  BP 125/68  Pulse 59  Temp(Src) 98.1 F (36.7 C) (Oral)  Resp 16  Ht _0  (1.549 m)  Wt 174 lb 8 oz (79.153 kg)  BMI 32.99 kg/m2  SpO2 99%  LMP 02/07/1992  Physical Exam    Vitals reviewed. Constitutional: She is oriented to person, place, and time and well-developed, well-nourished, and in  no distress.  HENT:  Head: Normocephalic and atraumatic.  Eyes: Conjunctivae are normal.  Neck: Neck supple. No thyromegaly present.  Cardiovascular: Normal rate, regular rhythm, normal heart sounds and intact distal pulses.   Pulmonary/Chest: Effort normal and breath sounds normal. No respiratory distress. She has no wheezes. She has no rales. She exhibits no tenderness.  Abdominal: Soft. Bowel sounds are normal. She exhibits no distension and no mass. There is no rebound and no guarding.  + Suprapubic tenderness with palpation.  Negative CVA tenderness.  Lymphadenopathy:    She has no cervical adenopathy.  Neurological: She is alert and oriented to person, place, and time.  Skin: Skin is warm and dry. No rash noted.  Psychiatric: Affect normal.    Recent Results (from the past 2160 hour(s))  LIPID PANEL     Status: None   Collection Time    10/17/13 11:34 AM      Result Value Ref Range   Cholesterol 195  0 - 200 mg/dL   Comment: ATP III Classification       Desirable:  < 200 mg/dL               Borderline High:  200 - 239 mg/dL          High:  > = 240 mg/dL   Triglycerides 129.0  0.0 - 149.0 mg/dL   Comment: Normal:  <150 mg/dLBorderline High:  150 - 199 mg/dL   HDL 74.10  >39.00 mg/dL   VLDL 25.8  0.0 - 40.0 mg/dL   LDL Cholesterol 95  0 - 99 mg/dL   Total CHOL/HDL Ratio 3     Comment:                Men          Women1/2 Average Risk     3.4          3.3Average Risk          5.0          4.42X Average Risk          9.6          7.13X Average Risk          15.0          11.0                       NonHDL 120.90     Comment: NOTE:  Non-HDL goal should be 30 mg/dL higher than patient's LDL goal (i.e. LDL goal of < 70 mg/dL, would have non-HDL goal of < 100 mg/dL)  RENAL FUNCTION PANEL     Status: Abnormal   Collection Time    10/17/13 11:34 AM      Result Value  Ref Range   Sodium 140  135 - 145 mEq/L   Potassium 4.1  3.5 - 5.1 mEq/L   Chloride 106  96 - 112 mEq/L   CO2 25  19 - 32 mEq/L   Calcium 9.4  8.4 - 10.5 mg/dL   Albumin 3.9  3.5 - 5.2 g/dL   BUN 14  6 - 23 mg/dL   Creatinine, Ser 0.7  0.4 - 1.2 mg/dL   Glucose, Bld 118 (*) 70 - 99 mg/dL   Phosphorus 3.2  2.3 - 4.6 mg/dL   GFR 87.29  >60.00 mL/min  CBC WITH DIFFERENTIAL     Status: Abnormal   Collection Time    10/17/13 11:34 AM  Result Value Ref Range   WBC 6.8  4.0 - 10.5 K/uL   RBC 4.50  3.87 - 5.11 Mil/uL   Hemoglobin 11.3 (*) 12.0 - 15.0 g/dL   HCT 34.6 (*) 36.0 - 46.0 %   MCV 76.9 (*) 78.0 - 100.0 fl   MCHC 32.6  30.0 - 36.0 g/dL   RDW 15.6 (*) 11.5 - 15.5 %   Platelets 262.0  150.0 - 400.0 K/uL   Neutrophils Relative % 54.6  43.0 - 77.0 %   Lymphocytes Relative 36.2  12.0 - 46.0 %   Monocytes Relative 7.3  3.0 - 12.0 %   Eosinophils Relative 1.3  0.0 - 5.0 %   Basophils Relative 0.6  0.0 - 3.0 %   Neutro Abs 3.7  1.4 - 7.7 K/uL   Lymphs Abs 2.5  0.7 - 4.0 K/uL   Monocytes Absolute 0.5  0.1 - 1.0 K/uL   Eosinophils Absolute 0.1  0.0 - 0.7 K/uL   Basophils Absolute 0.0  0.0 - 0.1 K/uL  TSH     Status: None   Collection Time    10/17/13 11:34 AM      Result Value Ref Range   TSH 0.79  0.35 - 4.50 uIU/mL  HEPATIC FUNCTION PANEL     Status: None   Collection Time    10/17/13 11:34 AM      Result Value Ref Range   Total Bilirubin 0.5  0.2 - 1.2 mg/dL   Bilirubin, Direct 0.0  0.0 - 0.3 mg/dL   Alkaline Phosphatase 61  39 - 117 U/L   AST 32  0 - 37 U/L   ALT 27  0 - 35 U/L   Total Protein 7.6  6.0 - 8.3 g/dL   Albumin 3.9  3.5 - 5.2 g/dL  HEMOGLOBIN A1C     Status: Abnormal   Collection Time    10/17/13 11:34 AM      Result Value Ref Range   Hemoglobin A1C 6.9 (*) 4.6 - 6.5 %   Comment: Glycemic Control Guidelines for People with Diabetes:Non Diabetic:  <6%Goal of Therapy: <7%Additional Action Suggested:  >8%   CBC WITH DIFFERENTIAL     Status: Abnormal    Collection Time    11/12/13  9:50 PM      Result Value Ref Range   WBC 7.6  4.0 - 10.5 K/uL   RBC 4.33  3.87 - 5.11 MIL/uL   Hemoglobin 10.9 (*) 12.0 - 15.0 g/dL   HCT 33.0 (*) 36.0 - 46.0 %   MCV 76.2 (*) 78.0 - 100.0 fL   MCH 25.2 (*) 26.0 - 34.0 pg   MCHC 33.0  30.0 - 36.0 g/dL   RDW 14.9  11.5 - 15.5 %   Platelets 232  150 - 400 K/uL   Neutrophils Relative % 53  43 - 77 %   Neutro Abs 4.1  1.7 - 7.7 K/uL   Lymphocytes Relative 34  12 - 46 %   Lymphs Abs 2.6  0.7 - 4.0 K/uL   Monocytes Relative 11  3 - 12 %   Monocytes Absolute 0.8  0.1 - 1.0 K/uL   Eosinophils Relative 1  0 - 5 %   Eosinophils Absolute 0.1  0.0 - 0.7 K/uL   Basophils Relative 1  0 - 1 %   Basophils Absolute 0.1  0.0 - 0.1 K/uL  COMPREHENSIVE METABOLIC PANEL     Status: Abnormal   Collection Time    11/12/13  9:50 PM      Result Value Ref Range   Sodium 139  137 - 147 mEq/L   Potassium 4.0  3.7 - 5.3 mEq/L   Chloride 99  96 - 112 mEq/L   CO2 24  19 - 32 mEq/L   Glucose, Bld 138 (*) 70 - 99 mg/dL   BUN 15  6 - 23 mg/dL   Creatinine, Ser 0.57  0.50 - 1.10 mg/dL   Calcium 9.6  8.4 - 10.5 mg/dL   Total Protein 7.6  6.0 - 8.3 g/dL   Albumin 3.5  3.5 - 5.2 g/dL   AST 25  0 - 37 U/L   ALT 23  0 - 35 U/L   Alkaline Phosphatase 73  39 - 117 U/L   Total Bilirubin 0.2 (*) 0.3 - 1.2 mg/dL   GFR calc non Af Amer >90  >90 mL/min   GFR calc Af Amer >90  >90 mL/min   Comment: (NOTE)     The eGFR has been calculated using the CKD EPI equation.     This calculation has not been validated in all clinical situations.     eGFR's persistently <90 mL/min signify possible Chronic Kidney     Disease.   Anion gap 16 (*) 5 - 15  LIPASE, BLOOD     Status: None   Collection Time    11/12/13  9:50 PM      Result Value Ref Range   Lipase 43  11 - 59 U/L  TROPONIN I     Status: None   Collection Time    11/12/13  9:50 PM      Result Value Ref Range   Troponin I <0.30  <0.30 ng/mL   Comment:            Due to the release  kinetics of cTnI,     a negative result within the first hours     of the onset of symptoms does not rule out     myocardial infarction with certainty.     If myocardial infarction is still suspected,     repeat the test at appropriate intervals.  MAGNESIUM     Status: None   Collection Time    11/12/13  9:50 PM      Result Value Ref Range   Magnesium 1.8  1.5 - 2.5 mg/dL  URINALYSIS, ROUTINE W REFLEX MICROSCOPIC     Status: Abnormal   Collection Time    11/12/13 11:33 PM      Result Value Ref Range   Color, Urine STRAW (*) YELLOW   APPearance CLEAR  CLEAR   Specific Gravity, Urine 1.004 (*) 1.005 - 1.030   pH 6.5  5.0 - 8.0   Glucose, UA NEGATIVE  NEGATIVE mg/dL   Hgb urine dipstick TRACE (*) NEGATIVE   Bilirubin Urine NEGATIVE  NEGATIVE   Ketones, ur NEGATIVE  NEGATIVE mg/dL   Protein, ur NEGATIVE  NEGATIVE mg/dL   Urobilinogen, UA 0.2  0.0 - 1.0 mg/dL   Nitrite NEGATIVE  NEGATIVE   Leukocytes, UA TRACE (*) NEGATIVE  URINE CULTURE     Status: None   Collection Time    11/12/13 11:33 PM      Result Value Ref Range   Specimen Description URINE, RANDOM     Special Requests NONE     Culture  Setup Time       Value: 11/13/2013 04:08     Performed at [facility]  Colony Count       Value: 20,OOO COLONIES/ML     Performed at [facility]   Culture       Value: PROTEUS MIRABILIS     Performed at [facility]   Report Status 11/15/2013 FINAL     Organism ID, Bacteria PROTEUS MIRABILIS    URINE MICROSCOPIC-ADD ON     Status: None   Collection Time    11/12/13 11:33 PM      Result Value Ref Range   WBC, UA 3-6  <3 WBC/hpf   RBC / HPF 0-2  <3 RBC/hpf   Bacteria, UA RARE  RARE  TROPONIN I     Status: Abnormal   Collection Time    11/13/13 12:00 AM      Result Value Ref Range   Troponin I 0.39 (*) <0.30 ng/mL   Comment:            Due to the release kinetics of cTnI,     a negative result within the first hours     of the onset of symptoms does  not rule out     myocardial infarction with certainty.     If myocardial infarction is still suspected,     repeat the test at appropriate intervals.     CRITICAL RESULT CALLED TO, READ BACK BY AND VERIFIED WITH:     Jack Quarto [id number] WILDERK  CBG MONITORING, ED     Status: None   Collection Time    11/13/13  1:50 AM      Result Value Ref Range   Glucose-Capillary 98  70 - 99 mg/dL  TROPONIN I     Status: None   Collection Time    11/13/13  2:50 AM      Result Value Ref Range   Troponin I <0.30  <0.30 ng/mL   Comment:            Due to the release kinetics of cTnI,     a negative result within the first hours     of the onset of symptoms does not rule out     myocardial infarction with certainty.     If myocardial infarction is still suspected,     repeat the test at appropriate intervals.  BASIC METABOLIC PANEL     Status: Abnormal   Collection Time    11/13/13  2:50 AM      Result Value Ref Range   Sodium 140  137 - 147 mEq/L   Potassium 3.9  3.7 - 5.3 mEq/L   Chloride 103  96 - 112 mEq/L   CO2 21  19 - 32 mEq/L   Glucose, Bld 162 (*) 70 - 99 mg/dL   BUN 12  6 - 23 mg/dL   Creatinine, Ser 0.62  0.50 - 1.10 mg/dL   Calcium 9.2  8.4 - 10.5 mg/dL   GFR calc non Af Amer 88 (*) >90 mL/min   GFR calc Af Amer >90  >90 mL/min   Comment: (NOTE)     The eGFR has been calculated using the CKD EPI equation.     This calculation has not been validated in all clinical situations.     eGFR's persistently <90 mL/min signify possible Chronic Kidney     Disease.   Anion gap 16 (*) 5 - 15  GLUCOSE, CAPILLARY     Status: Abnormal   Collection Time    11/13/13  3:29 AM  Result Value Ref Range   Glucose-Capillary 161 (*) 70 - 99 mg/dL  GLUCOSE, CAPILLARY     Status: Abnormal   Collection Time    11/13/13  8:12 AM      Result Value Ref Range   Glucose-Capillary 108 (*) 70 - 99 mg/dL   Comment 1 Documented in Chart     Comment 2 Notify RN    TROPONIN I     Status: None    Collection Time    11/13/13 10:50 AM      Result Value Ref Range   Troponin I <0.30  <0.30 ng/mL   Comment:            Due to the release kinetics of cTnI,     a negative result within the first hours     of the onset of symptoms does not rule out     myocardial infarction with certainty.     If myocardial infarction is still suspected,     repeat the test at appropriate intervals.  GLUCOSE, CAPILLARY     Status: Abnormal   Collection Time    11/13/13 11:30 AM      Result Value Ref Range   Glucose-Capillary 152 (*) 70 - 99 mg/dL   Comment 1 Documented in Chart     Comment 2 Notify RN    VITAMIN D 25 HYDROXY     Status: None   Collection Time    11/19/13 12:23 PM      Result Value Ref Range   VITD 61.52  30.00 - 100.00 ng/mL  FERRITIN     Status: Abnormal   Collection Time    11/19/13 12:23 PM      Result Value Ref Range   Ferritin 4.7 (*) 10.0 - 291.0 ng/mL    Assessment/Plan: Paroxysmal supraventricular tachycardia Patient with recent Cardiology evaluation.  Continue current dose of Metoprolol.  Reiterated to patient that she has upcoming [employer].  Acute cystitis without hematuria Resistant to several antibiotic regimens.  250 mg IM Ceftriaxone injection given by nursing staff.  Patient started on oral course of Ampicillin.  Increase fluid intake.  Rest.  Probiotic and Cranberry supplement.  Alarm signs/symptoms discussed with patient.  Follow-up next week with myself or PCP for re-evaluation and repeat UA/Culture.  RLS (restless legs syndrome) Will check iron level to assess for deficiency as cause of symptoms. Medication regimen will be selected based on results.

## 2013-11-20 ENCOUNTER — Telehealth: Payer: Self-pay | Admitting: Physician Assistant

## 2013-11-20 DIAGNOSIS — G2581 Restless legs syndrome: Secondary | ICD-10-CM

## 2013-11-20 DIAGNOSIS — E611 Iron deficiency: Secondary | ICD-10-CM

## 2013-11-20 MED ORDER — FERROUS SULFATE 325 (65 FE) MG PO TABS: 325.0000 mg | ORAL_TABLET | Freq: Two times a day (BID) | ORAL | Status: DC

## 2013-11-20 NOTE — Telephone Encounter (Signed)
Labs reveal iron deficiency which could very well be cause of her leg symptoms.  I think she has mild restless leg syndrome due to the iron deficiency.  I have called in an iron supplement to her pharmacy to take as directed to help with this.  Most common side effect is constipation -- stay well hydrated and take a fiber supplement to prevent this.  I will see her next week at follow-up.

## 2013-11-21 MED ORDER — LIDOCAINE HCL 1 % IJ SOLN
250.0000 mg | Freq: Once | INTRAMUSCULAR | Status: AC
  Administered 2013-11-19: 250 mg via INTRAMUSCULAR

## 2013-11-21 NOTE — Telephone Encounter (Signed)
Patient requesting lab results. Patient states that she is still in pain and the medicine is not working. Best # [phone]

## 2013-11-21 NOTE — Telephone Encounter (Signed)
Patient informed, understood & agreed/SLS  

## 2013-11-23 DIAGNOSIS — G2581 Restless legs syndrome: Secondary | ICD-10-CM | POA: Insufficient documentation

## 2013-11-23 DIAGNOSIS — N3000 Acute cystitis without hematuria: Secondary | ICD-10-CM | POA: Insufficient documentation

## 2013-11-23 NOTE — Assessment & Plan Note (Signed)
Will check iron level to assess for deficiency as cause of symptoms. Medication regimen will be selected based on results.

## 2013-11-23 NOTE — Assessment & Plan Note (Signed)
Patient with recent Cardiology evaluation.  Continue current dose of Metoprolol.  Reiterated to patient that she has upcoming [employer].

## 2013-11-23 NOTE — Assessment & Plan Note (Signed)
Resistant to several antibiotic regimens.  250 mg IM Ceftriaxone injection given by nursing staff.  Patient started on oral course of Ampicillin.  Increase fluid intake.  Rest.  Probiotic and Cranberry supplement.  Alarm signs/symptoms discussed with patient.  Follow-up next week with myself or PCP for re-evaluation and repeat UA/Culture.

## 2013-11-24 ENCOUNTER — Ambulatory Visit: Payer: Medicare Other | Admitting: Rehabilitation

## 2013-11-25 DIAGNOSIS — H2513 Age-related nuclear cataract, bilateral: Secondary | ICD-10-CM | POA: Diagnosis not present

## 2013-11-26 ENCOUNTER — Encounter: Payer: Self-pay | Admitting: Physician Assistant

## 2013-11-26 ENCOUNTER — Ambulatory Visit (INDEPENDENT_AMBULATORY_CARE_PROVIDER_SITE_OTHER): Payer: Medicare Other | Admitting: Physician Assistant

## 2013-11-26 ENCOUNTER — Telehealth (HOSPITAL_COMMUNITY): Payer: Self-pay

## 2013-11-26 VITALS — BP 130/65 | HR 59 | Temp 98.3°F | Resp 16 | Ht 61.0 in | Wt 176.5 lb

## 2013-11-26 DIAGNOSIS — I6529 Occlusion and stenosis of unspecified carotid artery: Secondary | ICD-10-CM

## 2013-11-26 DIAGNOSIS — N3090 Cystitis, unspecified without hematuria: Secondary | ICD-10-CM

## 2013-11-26 LAB — POCT URINALYSIS DIPSTICK
Bilirubin, UA: NEGATIVE
Glucose, UA: NEGATIVE
Ketones, UA: NEGATIVE
LEUKOCYTES UA: NEGATIVE
Nitrite, UA: NEGATIVE
PH UA: 5
PROTEIN UA: NEGATIVE
Spec Grav, UA: <=1.005
Urobilinogen, UA: 0.2

## 2013-11-26 LAB — URINALYSIS, MICROSCOPIC ONLY

## 2013-11-26 NOTE — Patient Instructions (Addendum)
I am sending your urine for further testing to ensure resolution of urine infection.  I am glad you are feeling better. Please take your iron supplement -- try once daily -- to help with iron deficiency and fatigue.  The dizziness you noted seems to be stemming from fluid behind your left ear.  Use Flonase nasal spray daily to help open your eustachian tubes and drain fluid. Follow-up will be based on your results.

## 2013-11-26 NOTE — Telephone Encounter (Signed)
Encounter complete. 

## 2013-11-26 NOTE — Addendum Note (Signed)
Addended by: Peggyann Shoals on: 11/26/2013 01:31 PM   Modules accepted: Orders

## 2013-11-26 NOTE — Assessment & Plan Note (Signed)
Urine dip with trace blood.  Will send for micro and culture. Complete antibiotic course.  Follow-up with Dr. Charlett Blake as scheduled.

## 2013-11-26 NOTE — Progress Notes (Signed)
Pre visit review using our clinic review tool, if applicable. No additional management support is needed unless otherwise documented below in the visit note/SLS  

## 2013-11-26 NOTE — Progress Notes (Signed)
Patient presents to clinic today for 1 week follow-up of UTI. Patient states urinary urgency, frequency, LBP and suprapubic pain have all resolved with antibiotic.  Denies new symptoms. Still having some mild fatigue.  Was diagnosed with iron deficiency at last visit.  Has not started supplementation yet.   Past Medical History  Diagnosis Date  . Gastroparesis   . Pure hypercholesterolemia   . Headache(784.0)   . Unspecified essential hypertension   . Esophageal reflux   . Type II or unspecified type diabetes mellitus without mention of complication, not stated as uncontrolled   . Arthritis     Spinal Osteoarthritis  . Diverticulosis 08/30/2000    Colonoscopy   . Gastric polyp     Fundic Gland  . Anxiety   . Cataract   . Heart murmur     Echocardiogram 2/11: EF 60-65%, mild LAE, grade 1 diastolic dysfunction, aortic valve sclerosis, mean gradient 9 mm of mercury, PASP 34  . Chest pain     Myoview 1/11: EF 82%, no ischemia.  . Carcinoid tumor of stomach   . PSVT (paroxysmal supraventricular tachycardia)   . Leg swelling     bilateral  . Chronic kidney disease     Left kidney smaller than right kidney  . Cancer   . PONV (postoperative nausea and vomiting)     pt states only needs small amount of anesthesia  . Stroke     tia, 2014  . Iron deficiency anemia, unspecified   . Encounter for preventative adult health care exam with abnormal findings 09/14/2013  . Unspecified hereditary and idiopathic peripheral neuropathy 10/29/2013  . Diabetic peripheral neuropathy 10/29/2013    Current Outpatient Prescriptions on File Prior to Visit  Medication Sig Dispense Refill  . acetaminophen (TYLENOL) 500 MG tablet Take 2 tablets (1,000 mg total) by mouth every 6 (six) hours as needed for moderate pain.  30 tablet  0  . ALPRAZolam (XANAX) 0.25 MG tablet Take 1 tablet (0.25 mg total) by mouth 3 (three) times daily as needed for anxiety.  30 tablet  0  . amLODipine (NORVASC) 5 MG tablet Take 5  mg by mouth at bedtime.      Marland Kitchen aspirin 81 MG tablet Take 81 mg by mouth daily.      . calcium-vitamin D (OSCAL WITH D) 500-200 MG-UNIT per tablet Take 1 tablet by mouth.      . cholecalciferol (VITAMIN D) 1000 UNITS tablet Take 1,000 Units by mouth daily.        Marland Kitchen dicyclomine (BENTYL) 10 MG capsule Take 1 capsule (10 mg total) by mouth every 6 (six) hours as needed (abdominal pain- may take 2 capsules if 1 ineffective).  90 capsule  5  . esomeprazole (NEXIUM) 40 MG capsule Take 1 capsule (40 mg total) by mouth daily before breakfast.  90 capsule  3  . ferrous sulfate 325 (65 FE) MG tablet Take 1 tablet (325 mg total) by mouth 2 (two) times daily with a meal.  60 tablet  3  . folic acid-pyridoxine-cyancobalamin (FOLBIC) 2.5-25-2 MG TABS Take 1 tablet by mouth daily.  90 tablet  1  . metFORMIN (GLUCOPHAGE-XR) 500 MG 24 hr tablet Take 2 tablets (1,000 mg total) by mouth 2 (two) times daily.  180 tablet  1  . metoCLOPramide (REGLAN) 5 MG tablet Take 5 mg by mouth 2 (two) times daily.      . metoprolol tartrate (LOPRESSOR) 25 MG tablet Take 2 tablet (50 mg total) by mouth  2 (two) times   daily.  180 tablet  0  . Multiple Vitamin (MULTIVITAMIN) tablet Take 1 tablet by mouth daily.        . ondansetron (ZOFRAN) 4 MG tablet Take 4 mg by mouth every 4 (four) hours as needed. For nausea        . potassium chloride (K-DUR,KLOR-CON) 10 MEQ tablet Take 1 tablet (10 mEq total) by mouth daily.  90 tablet  3  . Propylene Glycol (SYSTANE BALANCE OP) Apply 1 drop to eye 2 (two) times daily.      . rosuvastatin (CRESTOR) 10 MG tablet Take 1 tablet (10 mg total) by mouth daily.  30 tablet  11  . valsartan (DIOVAN) 320 MG tablet Take 1 tablet (320 mg total) by mouth daily.  30 tablet  0   No current facility-administered medications on file prior to visit.    Allergies  Allergen Reactions  . Tramadol     Dizziness     Family History  Problem Relation Age of Onset  . Diabetes Mother   . Stroke Father      deceased age 80  . Heart disease Sister     deceased MI age 88  . Heart disease Brother     deceased MI age 92  . Colon cancer Neg Hx   . Esophageal cancer Neg Hx   . Stomach cancer Neg Hx   . Rectal cancer Neg Hx   . Diabetes Maternal Grandmother   . Hypertension Paternal Grandmother   . Diabetes Sister   . Heart disease Sister   . Hypertension Sister   . Hyperlipidemia Sister   . Diabetes Sister   . Heart disease Sister   . Hypertension Sister   . Hyperlipidemia Sister   . Diabetes Brother   . Heart disease Brother   . Hypertension Brother   . Hyperlipidemia Brother     History   Social History  . Marital Status: Married    Spouse Name: N/A    Number of Children: 0  . Years of Education: college   Occupational History  . Retail    Social History Main Topics  . Smoking status: Never Smoker   . Smokeless tobacco: Never Used     Comment: Daily Caffeine Use -1  . Alcohol Use: No  . Drug Use: No  . Sexual Activity: No     Comment: lives alone, no dietary restrictions except avoid fresh veg, fruit, whole grains   Other Topics Concern  . None   Social History Narrative   Patient is married Tourist information centre manager) and lives with her husband.   Patient does not have any children.   Patient is right-handed.   Patient has a BA degree.   One caffeine drink daily    Review of Systems - See HPI.  All other ROS are negative.  BP 130/65  Pulse 59  Temp(Src) 98.3 F (36.8 C) (Oral)  Resp 16  Ht $R'5\' 1"'Ji$  (1.549 m)  Wt 176 lb 8 oz (80.06 kg)  BMI 33.37 kg/m2  SpO2 100%  LMP 02/07/1992  Physical Exam  Vitals reviewed. Constitutional: She is oriented to person, place, and time and well-developed, well-nourished, and in no distress.  HENT:  Head: Normocephalic and atraumatic.  Eyes: Conjunctivae are normal. Pupils are equal, round, and reactive to light.  Neck: Neck supple.  Cardiovascular: Normal rate, regular rhythm, normal heart sounds and intact distal pulses.     Pulmonary/Chest: Effort normal and breath sounds normal. No respiratory  distress. She has no wheezes. She has no rales. She exhibits no tenderness.  Abdominal: Soft. Bowel sounds are normal. She exhibits no distension. There is no tenderness.  Negative CVA tenderness.  Lymphadenopathy:    She has no cervical adenopathy.  Neurological: She is alert and oriented to person, place, and time.  Skin: Skin is warm and dry. No rash noted.  Psychiatric: Affect normal.    Recent Results (from the past 2160 hour(s))  LIPID PANEL     Status: None   Collection Time    10/17/13 11:34 AM      Result Value Ref Range   Cholesterol 195  0 - 200 mg/dL   Comment: ATP III Classification       Desirable:  < 200 mg/dL               Borderline High:  200 - 239 mg/dL          High:  > = 240 mg/dL   Triglycerides 129.0  0.0 - 149.0 mg/dL   Comment: Normal:  <150 mg/dLBorderline High:  150 - 199 mg/dL   HDL 74.10  >39.00 mg/dL   VLDL 25.8  0.0 - 40.0 mg/dL   LDL Cholesterol 95  0 - 99 mg/dL   Total CHOL/HDL Ratio 3     Comment:                Men          Women1/2 Average Risk     3.4          3.3Average Risk          5.0          4.42X Average Risk          9.6          7.13X Average Risk          15.0          11.0                       NonHDL 120.90     Comment: NOTE:  Non-HDL goal should be 30 mg/dL higher than patient's LDL goal (i.e. LDL goal of < 70 mg/dL, would have non-HDL goal of < 100 mg/dL)  RENAL FUNCTION PANEL     Status: Abnormal   Collection Time    10/17/13 11:34 AM      Result Value Ref Range   Sodium 140  135 - 145 mEq/L   Potassium 4.1  3.5 - 5.1 mEq/L   Chloride 106  96 - 112 mEq/L   CO2 25  19 - 32 mEq/L   Calcium 9.4  8.4 - 10.5 mg/dL   Albumin 3.9  3.5 - 5.2 g/dL   BUN 14  6 - 23 mg/dL   Creatinine, Ser 0.7  0.4 - 1.2 mg/dL   Glucose, Bld 118 (*) 70 - 99 mg/dL   Phosphorus 3.2  2.3 - 4.6 mg/dL   GFR 87.29  >60.00 mL/min  CBC WITH DIFFERENTIAL     Status: Abnormal   Collection  Time    10/17/13 11:34 AM      Result Value Ref Range   WBC 6.8  4.0 - 10.5 K/uL   RBC 4.50  3.87 - 5.11 Mil/uL   Hemoglobin 11.3 (*) 12.0 - 15.0 g/dL   HCT 34.6 (*) 36.0 - 46.0 %   MCV 76.9 (*) 78.0 - 100.0 fl   MCHC 32.6  30.0 - 36.0 g/dL   RDW 15.6 (*) 11.5 - 15.5 %   Platelets 262.0  150.0 - 400.0 K/uL   Neutrophils Relative % 54.6  43.0 - 77.0 %   Lymphocytes Relative 36.2  12.0 - 46.0 %   Monocytes Relative 7.3  3.0 - 12.0 %   Eosinophils Relative 1.3  0.0 - 5.0 %   Basophils Relative 0.6  0.0 - 3.0 %   Neutro Abs 3.7  1.4 - 7.7 K/uL   Lymphs Abs 2.5  0.7 - 4.0 K/uL   Monocytes Absolute 0.5  0.1 - 1.0 K/uL   Eosinophils Absolute 0.1  0.0 - 0.7 K/uL   Basophils Absolute 0.0  0.0 - 0.1 K/uL  TSH     Status: None   Collection Time    10/17/13 11:34 AM      Result Value Ref Range   TSH 0.79  0.35 - 4.50 uIU/mL  HEPATIC FUNCTION PANEL     Status: None   Collection Time    10/17/13 11:34 AM      Result Value Ref Range   Total Bilirubin 0.5  0.2 - 1.2 mg/dL   Bilirubin, Direct 0.0  0.0 - 0.3 mg/dL   Alkaline Phosphatase 61  39 - 117 U/L   AST 32  0 - 37 U/L   ALT 27  0 - 35 U/L   Total Protein 7.6  6.0 - 8.3 g/dL   Albumin 3.9  3.5 - 5.2 g/dL  HEMOGLOBIN A1C     Status: Abnormal   Collection Time    10/17/13 11:34 AM      Result Value Ref Range   Hemoglobin A1C 6.9 (*) 4.6 - 6.5 %   Comment: Glycemic Control Guidelines for People with Diabetes:Non Diabetic:  <6%Goal of Therapy: <7%Additional Action Suggested:  >8%   CBC WITH DIFFERENTIAL     Status: Abnormal   Collection Time    11/12/13  9:50 PM      Result Value Ref Range   WBC 7.6  4.0 - 10.5 K/uL   RBC 4.33  3.87 - 5.11 MIL/uL   Hemoglobin 10.9 (*) 12.0 - 15.0 g/dL   HCT 33.0 (*) 36.0 - 46.0 %   MCV 76.2 (*) 78.0 - 100.0 fL   MCH 25.2 (*) 26.0 - 34.0 pg   MCHC 33.0  30.0 - 36.0 g/dL   RDW 14.9  11.5 - 15.5 %   Platelets 232  150 - 400 K/uL   Neutrophils Relative % 53  43 - 77 %   Neutro Abs 4.1  1.7 - 7.7  K/uL   Lymphocytes Relative 34  12 - 46 %   Lymphs Abs 2.6  0.7 - 4.0 K/uL   Monocytes Relative 11  3 - 12 %   Monocytes Absolute 0.8  0.1 - 1.0 K/uL   Eosinophils Relative 1  0 - 5 %   Eosinophils Absolute 0.1  0.0 - 0.7 K/uL   Basophils Relative 1  0 - 1 %   Basophils Absolute 0.1  0.0 - 0.1 K/uL  COMPREHENSIVE METABOLIC PANEL     Status: Abnormal   Collection Time    11/12/13  9:50 PM      Result Value Ref Range   Sodium 139  137 - 147 mEq/L   Potassium 4.0  3.7 - 5.3 mEq/L   Chloride 99  96 - 112 mEq/L   CO2 24  19 - 32 mEq/L   Glucose, Bld 138 (*) 70 -  99 mg/dL   BUN 15  6 - 23 mg/dL   Creatinine, Ser 0.57  0.50 - 1.10 mg/dL   Calcium 9.6  8.4 - 10.5 mg/dL   Total Protein 7.6  6.0 - 8.3 g/dL   Albumin 3.5  3.5 - 5.2 g/dL   AST 25  0 - 37 U/L   ALT 23  0 - 35 U/L   Alkaline Phosphatase 73  39 - 117 U/L   Total Bilirubin 0.2 (*) 0.3 - 1.2 mg/dL   GFR calc non Af Amer >90  >90 mL/min   GFR calc Af Amer >90  >90 mL/min   Comment: (NOTE)     The eGFR has been calculated using the CKD EPI equation.     This calculation has not been validated in all clinical situations.     eGFR's persistently <90 mL/min signify possible Chronic Kidney     Disease.   Anion gap 16 (*) 5 - 15  LIPASE, BLOOD     Status: None   Collection Time    11/12/13  9:50 PM      Result Value Ref Range   Lipase 43  11 - 59 U/L  TROPONIN I     Status: None   Collection Time    11/12/13  9:50 PM      Result Value Ref Range   Troponin I <0.30  <0.30 ng/mL   Comment:            Due to the release kinetics of cTnI,     a negative result within the first hours     of the onset of symptoms does not rule out     myocardial infarction with certainty.     If myocardial infarction is still suspected,     repeat the test at appropriate intervals.  MAGNESIUM     Status: None   Collection Time    11/12/13  9:50 PM      Result Value Ref Range   Magnesium 1.8  1.5 - 2.5 mg/dL  URINALYSIS, ROUTINE W REFLEX  MICROSCOPIC     Status: Abnormal   Collection Time    11/12/13 11:33 PM      Result Value Ref Range   Color, Urine STRAW (*) YELLOW   APPearance CLEAR  CLEAR   Specific Gravity, Urine 1.004 (*) 1.005 - 1.030   pH 6.5  5.0 - 8.0   Glucose, UA NEGATIVE  NEGATIVE mg/dL   Hgb urine dipstick TRACE (*) NEGATIVE   Bilirubin Urine NEGATIVE  NEGATIVE   Ketones, ur NEGATIVE  NEGATIVE mg/dL   Protein, ur NEGATIVE  NEGATIVE mg/dL   Urobilinogen, UA 0.2  0.0 - 1.0 mg/dL   Nitrite NEGATIVE  NEGATIVE   Leukocytes, UA TRACE (*) NEGATIVE  URINE CULTURE     Status: None   Collection Time    11/12/13 11:33 PM      Result Value Ref Range   Specimen Description URINE, RANDOM     Special Requests NONE     Culture  Setup Time       Value: 11/13/2013 04:08     Performed at [facility]       Value: 20,OOO COLONIES/ML     Performed at [facility]   Culture       Value: PROTEUS MIRABILIS     Performed at [facility]   Report Status 11/15/2013 FINAL     Organism ID, Bacteria PROTEUS  MIRABILIS    URINE MICROSCOPIC-ADD ON     Status: None   Collection Time    11/12/13 11:33 PM      Result Value Ref Range   WBC, UA 3-6  <3 WBC/hpf   RBC / HPF 0-2  <3 RBC/hpf   Bacteria, UA RARE  RARE  TROPONIN I     Status: Abnormal   Collection Time    11/13/13 12:00 AM      Result Value Ref Range   Troponin I 0.39 (*) <0.30 ng/mL   Comment:            Due to the release kinetics of cTnI,     a negative result within the first hours     of the onset of symptoms does not rule out     myocardial infarction with certainty.     If myocardial infarction is still suspected,     repeat the test at appropriate intervals.     CRITICAL RESULT CALLED TO, READ BACK BY AND VERIFIED WITH:     Jack Quarto [phone] WILDERK  CBG MONITORING, ED     Status: None   Collection Time    11/13/13  1:50 AM      Result Value Ref Range   Glucose-Capillary 98  70 - 99 mg/dL  TROPONIN I      Status: None   Collection Time    11/13/13  2:50 AM      Result Value Ref Range   Troponin I <0.30  <0.30 ng/mL   Comment:            Due to the release kinetics of cTnI,     a negative result within the first hours     of the onset of symptoms does not rule out     myocardial infarction with certainty.     If myocardial infarction is still suspected,     repeat the test at appropriate intervals.  BASIC METABOLIC PANEL     Status: Abnormal   Collection Time    11/13/13  2:50 AM      Result Value Ref Range   Sodium 140  137 - 147 mEq/L   Potassium 3.9  3.7 - 5.3 mEq/L   Chloride 103  96 - 112 mEq/L   CO2 21  19 - 32 mEq/L   Glucose, Bld 162 (*) 70 - 99 mg/dL   BUN 12  6 - 23 mg/dL   Creatinine, Ser 0.62  0.50 - 1.10 mg/dL   Calcium 9.2  8.4 - 10.5 mg/dL   GFR calc non Af Amer 88 (*) >90 mL/min   GFR calc Af Amer >90  >90 mL/min   Comment: (NOTE)     The eGFR has been calculated using the CKD EPI equation.     This calculation has not been validated in all clinical situations.     eGFR's persistently <90 mL/min signify possible Chronic Kidney     Disease.   Anion gap 16 (*) 5 - 15  GLUCOSE, CAPILLARY     Status: Abnormal   Collection Time    11/13/13  3:29 AM      Result Value Ref Range   Glucose-Capillary 161 (*) 70 - 99 mg/dL  GLUCOSE, CAPILLARY     Status: Abnormal   Collection Time    11/13/13  8:12 AM      Result Value Ref Range   Glucose-Capillary 108 (*) 70 - 99 mg/dL   Comment  1 Documented in Chart     Comment 2 Notify RN    TROPONIN I     Status: None   Collection Time    11/13/13 10:50 AM      Result Value Ref Range   Troponin I <0.30  <0.30 ng/mL   Comment:            Due to the release kinetics of cTnI,     a negative result within the first hours     of the onset of symptoms does not rule out     myocardial infarction with certainty.     If myocardial infarction is still suspected,     repeat the test at appropriate intervals.  GLUCOSE, CAPILLARY      Status: Abnormal   Collection Time    11/13/13 11:30 AM      Result Value Ref Range   Glucose-Capillary 152 (*) 70 - 99 mg/dL   Comment 1 Documented in Chart     Comment 2 Notify RN    VITAMIN D 25 HYDROXY     Status: None   Collection Time    11/19/13 12:23 PM      Result Value Ref Range   VITD 61.52  30.00 - 100.00 ng/mL  FERRITIN     Status: Abnormal   Collection Time    11/19/13 12:23 PM      Result Value Ref Range   Ferritin 4.7 (*) 10.0 - 291.0 ng/mL   Assessment/Plan: Cystitis Urine dip with trace blood.  Will send for micro and culture. Complete antibiotic course.  Follow-up with Dr. Charlett Blake as scheduled.

## 2013-11-27 ENCOUNTER — Other Ambulatory Visit: Payer: Self-pay | Admitting: Cardiology

## 2013-11-27 ENCOUNTER — Ambulatory Visit: Payer: Medicare Other

## 2013-11-27 LAB — CULTURE, URINE COMPREHENSIVE
Colony Count: NO GROWTH
Organism ID, Bacteria: NO GROWTH

## 2013-12-01 ENCOUNTER — Ambulatory Visit: Payer: Medicare Other | Admitting: Rehabilitation

## 2013-12-01 DIAGNOSIS — Z5189 Encounter for other specified aftercare: Secondary | ICD-10-CM | POA: Diagnosis not present

## 2013-12-02 ENCOUNTER — Encounter (HOSPITAL_COMMUNITY): Payer: Medicare Other

## 2013-12-03 LAB — HM DIABETES EYE EXAM

## 2013-12-04 ENCOUNTER — Ambulatory Visit: Payer: Medicare Other

## 2013-12-04 DIAGNOSIS — Z5189 Encounter for other specified aftercare: Secondary | ICD-10-CM | POA: Diagnosis not present

## 2013-12-08 ENCOUNTER — Ambulatory Visit: Payer: Medicare Other | Attending: Family Medicine | Admitting: Rehabilitation

## 2013-12-08 DIAGNOSIS — Z5189 Encounter for other specified aftercare: Secondary | ICD-10-CM | POA: Insufficient documentation

## 2013-12-08 DIAGNOSIS — M545 Low back pain: Secondary | ICD-10-CM | POA: Insufficient documentation

## 2013-12-08 DIAGNOSIS — M542 Cervicalgia: Secondary | ICD-10-CM | POA: Diagnosis not present

## 2013-12-10 ENCOUNTER — Ambulatory Visit: Payer: Medicare Other | Admitting: Rehabilitation

## 2013-12-11 ENCOUNTER — Encounter: Payer: Self-pay | Admitting: Internal Medicine

## 2013-12-11 ENCOUNTER — Encounter (HOSPITAL_COMMUNITY): Payer: Medicare Other

## 2013-12-11 ENCOUNTER — Ambulatory Visit (INDEPENDENT_AMBULATORY_CARE_PROVIDER_SITE_OTHER): Payer: Medicare Other | Admitting: Internal Medicine

## 2013-12-11 ENCOUNTER — Ambulatory Visit: Payer: Medicare Other

## 2013-12-11 VITALS — BP 126/60 | HR 68 | Ht 61.0 in | Wt 175.1 lb

## 2013-12-11 DIAGNOSIS — K589 Irritable bowel syndrome without diarrhea: Secondary | ICD-10-CM

## 2013-12-11 DIAGNOSIS — K219 Gastro-esophageal reflux disease without esophagitis: Secondary | ICD-10-CM

## 2013-12-11 DIAGNOSIS — I6529 Occlusion and stenosis of unspecified carotid artery: Secondary | ICD-10-CM | POA: Diagnosis not present

## 2013-12-11 MED ORDER — RIFAXIMIN 550 MG PO TABS: 550.0000 mg | ORAL_TABLET | Freq: Three times a day (TID) | ORAL | Status: DC

## 2013-12-11 NOTE — Assessment & Plan Note (Signed)
I believe this is the cause of her symptoms. She is to continue to use the dicyclomine. 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 She will follow-up as needed.  15 minutes time spent with patient over half of which was in counseling and coordination of care.

## 2013-12-11 NOTE — Assessment & Plan Note (Signed)
Overall stable. Continue current care.

## 2013-12-11 NOTE — Progress Notes (Signed)
   Subjective:    [name], female    DOB: 1940-03-11, 73 y.o.   MRN: [id number]  HPI The patient is here for follow-up. 1-2 days ago she developed fairly intense suprapubic pain that radiated upward into the periumbilical area. She used her dicyclomine with relief and feels much better today. No bleeding. No fever. No nausea or vomiting. She was treated for a UTI couple of weeks ago and says that is cleared and the symptoms are not like that. She denies urinary symptoms now.  Bowel habits are the same as they've been though she has diarrhea about 3 days out of the week and says it lasts until noon and keeps her in the house. She does not have constipation or missed days of stool except rarely.  Medications, allergies, past medical history, past surgical history, family history and social history are reviewed and updated in the EMR.  Review of Systems As above, remains anxious    Objective:   Physical Exam She is a well-developed well-nourished woman in no acute distress. Eyes are anicteric Abdomen is soft bowel sounds present minimally tender in the suprapubic area no organomegaly or mass Appropriate mood and affect    Assessment & Plan:  IBS (irritable bowel syndrome) I believe this is the cause of her symptoms. She is to continue to use the dicyclomine. 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 She will follow-up as needed.  15 minutes time spent with patient over half of which was in counseling and coordination of care.  GERD (gastroesophageal reflux disease) Overall stable. Continue current care.

## 2013-12-11 NOTE — Patient Instructions (Signed)
Today you have been given samples of xifaxan.  Take one tablet three times a day for 2 weeks.   Follow up with [place] as needed.    I appreciate the opportunity to care for you.

## 2013-12-12 DIAGNOSIS — I471 Supraventricular tachycardia: Secondary | ICD-10-CM | POA: Diagnosis not present

## 2013-12-12 DIAGNOSIS — R5383 Other fatigue: Secondary | ICD-10-CM | POA: Diagnosis not present

## 2013-12-12 DIAGNOSIS — G2581 Restless legs syndrome: Secondary | ICD-10-CM

## 2013-12-12 DIAGNOSIS — N3000 Acute cystitis without hematuria: Secondary | ICD-10-CM | POA: Diagnosis not present

## 2013-12-15 ENCOUNTER — Ambulatory Visit: Payer: Medicare Other | Admitting: Rehabilitation

## 2013-12-15 ENCOUNTER — Other Ambulatory Visit: Payer: Self-pay

## 2013-12-15 MED ORDER — VALSARTAN 320 MG PO TABS: ORAL_TABLET | ORAL | Status: DC

## 2013-12-15 MED ORDER — METOPROLOL TARTRATE 50 MG PO TABS: ORAL_TABLET | ORAL | Status: DC

## 2013-12-18 ENCOUNTER — Ambulatory Visit: Payer: Medicare Other

## 2013-12-22 ENCOUNTER — Other Ambulatory Visit: Payer: Self-pay | Admitting: *Deleted

## 2013-12-22 MED ORDER — METOPROLOL TARTRATE 50 MG PO TABS: ORAL_TABLET | ORAL | Status: DC

## 2013-12-23 ENCOUNTER — Other Ambulatory Visit: Payer: Self-pay | Admitting: *Deleted

## 2013-12-23 ENCOUNTER — Other Ambulatory Visit: Payer: Self-pay

## 2013-12-23 MED ORDER — METOPROLOL TARTRATE 50 MG PO TABS: ORAL_TABLET | ORAL | Status: DC

## 2013-12-31 ENCOUNTER — Ambulatory Visit (INDEPENDENT_AMBULATORY_CARE_PROVIDER_SITE_OTHER): Payer: Medicare Other | Admitting: Medical

## 2013-12-31 ENCOUNTER — Encounter: Payer: Self-pay | Admitting: Medical

## 2013-12-31 ENCOUNTER — Ambulatory Visit: Payer: Medicare Other | Admitting: Physician Assistant

## 2013-12-31 VITALS — BP 146/77 | HR 68 | Temp 98.3°F | Ht 61.0 in | Wt 177.0 lb

## 2013-12-31 DIAGNOSIS — R82998 Other abnormal findings in urine: Secondary | ICD-10-CM

## 2013-12-31 DIAGNOSIS — I6529 Occlusion and stenosis of unspecified carotid artery: Secondary | ICD-10-CM | POA: Diagnosis not present

## 2013-12-31 DIAGNOSIS — R1030 Lower abdominal pain, unspecified: Secondary | ICD-10-CM

## 2013-12-31 DIAGNOSIS — N3001 Acute cystitis with hematuria: Secondary | ICD-10-CM

## 2013-12-31 DIAGNOSIS — N390 Urinary tract infection, site not specified: Secondary | ICD-10-CM | POA: Diagnosis not present

## 2013-12-31 LAB — POCT URINALYSIS DIPSTICK
Bilirubin, UA: NEGATIVE
GLUCOSE UA: NEGATIVE
Ketones, UA: NEGATIVE
NITRITE UA: NEGATIVE
Protein, UA: NEGATIVE
Spec Grav, UA: <=1.005
UROBILINOGEN UA: 0.2
pH, UA: 6

## 2013-12-31 MED ORDER — CIPROFLOXACIN HCL 250 MG PO TABS: 250.0000 mg | ORAL_TABLET | Freq: Two times a day (BID) | ORAL | Status: DC

## 2013-12-31 MED ORDER — PHENAZOPYRIDINE HCL 200 MG PO TABS: 200.0000 mg | ORAL_TABLET | Freq: Three times a day (TID) | ORAL | Status: DC | PRN

## 2013-12-31 NOTE — Progress Notes (Signed)
Pre visit review using our clinic review tool, if applicable. No additional management support is needed unless otherwise documented below in the visit note. 

## 2013-12-31 NOTE — Progress Notes (Signed)
Subjective:    [name], female    DOB: August 23, 1940, 73 y.o.   MRN: [id number]  HPI   Pt in with some pain over her suprapbic region. Pain present for 2 days. Pt stats some pain when she urinated. Described a little burning sensation.  Pt does state at October had uti. Low count bacteria. 20,000. Pt took antibiotic and she felt better.  Pt does not get infection frequently. Only symptoms last couple of month.   No fever, no nausea and no vomiting. Past Medical History  Diagnosis Date  . Gastroparesis   . Pure hypercholesterolemia   . Headache(784.0)   . Unspecified essential hypertension   . Esophageal reflux   . Type II or unspecified type diabetes mellitus without mention of complication, not stated as uncontrolled   . Arthritis     Spinal Osteoarthritis  . Diverticulosis 08/30/2000    Colonoscopy   . Gastric polyp     Fundic Gland  . Anxiety   . Cataract   . Heart murmur     Echocardiogram 2/11: EF 60-65%, mild LAE, grade 1 diastolic dysfunction, aortic valve sclerosis, mean gradient 9 mm of mercury, PASP 34  . Chest pain     Myoview 1/11: EF 82%, no ischemia.  . Carcinoid tumor of stomach   . PSVT (paroxysmal supraventricular tachycardia)   . Leg swelling     bilateral  . Chronic kidney disease     Left kidney smaller than right kidney  . Cancer   . PONV (postoperative nausea and vomiting)     pt states only needs small amount of anesthesia  . Stroke     tia, 2014  . Iron deficiency anemia, unspecified   . Encounter for preventative adult health care exam with abnormal findings 09/14/2013  . Unspecified hereditary and idiopathic peripheral neuropathy 10/29/2013  . Diabetic peripheral neuropathy 10/29/2013    History   Social History  . Marital Status: Married    Spouse Name: N/A    Number of Children: 0  . Years of Education: college   Occupational History  . Retail    Social History Main Topics  . Smoking status: Never Smoker   .  Smokeless tobacco: Never Used     Comment: Daily Caffeine Use -1  . Alcohol Use: No  . Drug Use: No  . Sexual Activity: No     Comment: lives alone, no dietary restrictions except avoid fresh veg, fruit, whole grains   Other Topics Concern  . Not on file   Social History Narrative   Patient is married ([name]) and lives with her husband.   Patient does not have any children.   Patient is right-handed.   Patient has a BA degree.   One caffeine drink daily     Past Surgical History  Procedure Laterality Date  . Cholecystectomy  1993  . Tonsillectomy    . Colonoscopy  11/11/2010    diverticulosis  . Esophagogastroduodenoscopy  08/29/2010; 09/15/2010    Carcinoid tumor less than 1 cm in July 2012 not seen in August 2012 , gastritis, fundic gland polyps  . Eus  12/15/2010    Procedure: UPPER ENDOSCOPIC ULTRASOUND (EUS) LINEAR;  Surgeon: Owens Loffler, MD;  Location: [facility];  Service: Endoscopy;  Laterality: N/A;  . Esophagogastroduodenoscopy  05/16/2011  . Esophagogastroduodenoscopy  06/14/2012  . Dilatation & currettage/hysteroscopy with resectocope N/A 02/25/2013    Procedure: Attempted hysteroscopy with uterine perforation;  Surgeon: Jamey Reas de Abigail Butts  Ebbie Latus, MD;  Location: [facility];  Service: Gynecology;  Laterality: N/A;  . Laparoscopy N/A 02/25/2013    Procedure: Cystoscopy and laparoscopy with fulguration of uterine serosa;  Surgeon: Jamey Reas de Berton Lan, MD;  Location: [facility];  Service: Gynecology;  Laterality: N/A;    Family History  Problem Relation Age of Onset  . Diabetes Mother   . Stroke Father     deceased age 55  . Heart disease Sister     deceased MI age 35  . Heart disease Brother     deceased MI age 2  . Colon cancer Neg Hx   . Esophageal cancer Neg Hx   . Stomach cancer Neg Hx   . Rectal cancer Neg Hx   . Diabetes Maternal Grandmother   . Hypertension Paternal Grandmother   . Diabetes Sister   . Heart disease Sister   .  Hypertension Sister   . Hyperlipidemia Sister   . Diabetes Sister   . Heart disease Sister   . Hypertension Sister   . Hyperlipidemia Sister   . Diabetes Brother   . Heart disease Brother   . Hypertension Brother   . Hyperlipidemia Brother     Allergies  Allergen Reactions  . Tramadol     Dizziness     Current Outpatient Prescriptions on File Prior to Visit  Medication Sig Dispense Refill  . acetaminophen (TYLENOL) 500 MG tablet Take 2 tablets (1,000 mg total) by mouth every 6 (six) hours as needed for moderate pain. 30 tablet 0  . ALPRAZolam (XANAX) 0.25 MG tablet Take 1 tablet (0.25 mg total) by mouth 3 (three) times daily as needed for anxiety. 30 tablet 0  . amLODipine (NORVASC) 5 MG tablet Take 5 mg by mouth at bedtime.    Marland Kitchen aspirin 81 MG tablet Take 81 mg by mouth daily.    . calcium-vitamin D (OSCAL WITH D) 500-200 MG-UNIT per tablet Take 1 tablet by mouth.    . cholecalciferol (VITAMIN D) 1000 UNITS tablet Take 1,000 Units by mouth daily.      Marland Kitchen dicyclomine (BENTYL) 10 MG capsule Take 1 capsule (10 mg total) by mouth every 6 (six) hours as needed (abdominal pain- may take 2 capsules if 1 ineffective). 90 capsule 5  . esomeprazole (NEXIUM) 40 MG capsule Take 1 capsule (40 mg total) by mouth daily before breakfast. 90 capsule 3  . ferrous sulfate 325 (65 FE) MG tablet Take 1 tablet (325 mg total) by mouth 2 (two) times daily with a meal. 60 tablet 3  . folic acid-pyridoxine-cyancobalamin (FOLBIC) 2.5-25-2 MG TABS Take 1 tablet by mouth daily. 90 tablet 1  . metFORMIN (GLUCOPHAGE-XR) 500 MG 24 hr tablet Take 2 tablets (1,000 mg total) by mouth 2 (two) times daily. 180 tablet 1  . metoCLOPramide (REGLAN) 5 MG tablet Take 5 mg by mouth 2 (two) times daily.    . metoprolol (LOPRESSOR) 50 MG tablet Take 1 tablet (50 mg total) by mouth 2 (two) times   daily. 180 tablet 3  . Multiple Vitamin (MULTIVITAMIN) tablet Take 1 tablet by mouth daily.      . ondansetron (ZOFRAN) 4 MG tablet  Take 4 mg by mouth every 4 (four) hours as needed. For nausea      . potassium chloride (K-DUR,KLOR-CON) 10 MEQ tablet Take 1 tablet (10 mEq total) by mouth daily. 90 tablet 3  . Propylene Glycol (SYSTANE BALANCE OP) Apply 1 drop to eye 2 (two) times daily.    Marland Kitchen  rifaximin (XIFAXAN) 550 MG TABS tablet Take 1 tablet (550 mg total) by mouth 3 (three) times daily. 42 tablet 0  . rosuvastatin (CRESTOR) 10 MG tablet Take 1 tablet (10 mg total) by mouth daily. 30 tablet 11  . valsartan (DIOVAN) 320 MG tablet TAKE 1 TABLET (320 MG TOTAL) BY MOUTH DAILY. 90 tablet 1   No current facility-administered medications on file prior to visit.    BP 146/77 mmHg  Pulse 68  Temp(Src) 98.3 F (36.8 C) (Oral)  Ht 5\' 1"  (1.549 m)  Wt 177 lb (80.287 kg)  BMI 33.46 kg/m2  SpO2 100%  LMP 02/07/1992        Review of Systems  Constitutional: Negative for fever, chills and fatigue.  HENT: Negative for congestion, ear discharge, ear pain, nosebleeds, postnasal drip, rhinorrhea, sinus pressure, sore throat and trouble swallowing.   Respiratory: Negative for cough, chest tightness, shortness of breath and wheezing.   Cardiovascular: Negative for chest pain and palpitations.  Gastrointestinal: Negative for nausea, vomiting, abdominal pain, diarrhea and constipation.  Genitourinary: Positive for dysuria and frequency. Negative for flank pain.       Suprapubic pressure.  Musculoskeletal: Negative for back pain.  Neurological: Negative for dizziness, tremors, seizures, syncope, weakness, light-headedness, numbness and headaches.  Hematological: Negative for adenopathy. Does not bruise/bleed easily.  Psychiatric/Behavioral: Negative for suicidal ideas, behavioral problems and dysphoric mood. The patient is not nervous/anxious.        Objective:   Physical Exam   General  Mental Status- Alert. Orientation- Orientation x 4.   Skin General:- Normal. Moisture- Dry. Temperature- Warm.  HEENT Head-  normal.  Neck Neck- Supple.  Heart Ausculation-RRR  Lungs Ausculation- Clear, even, unlabored bilaterlly.    Abdomen Palpation/Percussion: Palpation and Percussion of the abdomen reveal- only faint minimal mid suprapubic Tendenessr, No Rebound tenderness, No Rigidity(guarding), No Palpable abdominal masses and No jar tenderness. No suprapubic tenderness. Liver:-Normal. Spleen:- Normal. Other Characteristics- No Costovertebral angle tenderness- Left or Costovertebral angle tenderness- Right.  Auscultation: Auscultation of the abdomen reveals- Bowel Sounds normal.         Assessment & Plan:

## 2013-12-31 NOTE — Assessment & Plan Note (Signed)
Patient does  appear to have likely uti. I am going prescribe cipro antibiotic and give your pyridium for the bladder pain.  We will send out urine for culture.   If your symptoms worsen or change the UC or ED evaluation.

## 2013-12-31 NOTE — Patient Instructions (Signed)
You do appear to have likely uti. I am going prescribe cipro antibiotic and give your pyridium for the bladder pain.  We will send out urine for culture.   If your symptoms worsen or change the UC or ED evaluation.  Follow up in 7 days or as needed.

## 2014-01-02 LAB — URINE CULTURE

## 2014-01-03 ENCOUNTER — Telehealth: Payer: Self-pay | Admitting: Medical

## 2014-01-03 DIAGNOSIS — R300 Dysuria: Secondary | ICD-10-CM

## 2014-01-06 NOTE — Telephone Encounter (Signed)
Pt wants referral asap to [facility]. I put that referral in. Will notify referra staff.

## 2014-01-07 ENCOUNTER — Encounter: Payer: Self-pay | Admitting: Medical

## 2014-01-07 ENCOUNTER — Ambulatory Visit (INDEPENDENT_AMBULATORY_CARE_PROVIDER_SITE_OTHER): Payer: Medicare Other | Admitting: Medical

## 2014-01-07 VITALS — BP 147/79 | HR 65 | Temp 98.6°F | Ht 61.0 in | Wt 178.6 lb

## 2014-01-07 DIAGNOSIS — R1030 Lower abdominal pain, unspecified: Secondary | ICD-10-CM | POA: Diagnosis not present

## 2014-01-07 DIAGNOSIS — R300 Dysuria: Secondary | ICD-10-CM

## 2014-01-07 DIAGNOSIS — J029 Acute pharyngitis, unspecified: Secondary | ICD-10-CM | POA: Diagnosis not present

## 2014-01-07 DIAGNOSIS — I6529 Occlusion and stenosis of unspecified carotid artery: Secondary | ICD-10-CM | POA: Diagnosis not present

## 2014-01-07 DIAGNOSIS — R109 Unspecified abdominal pain: Secondary | ICD-10-CM | POA: Diagnosis not present

## 2014-01-07 LAB — POCT RAPID STREP A (OFFICE): Rapid Strep A Screen: NEGATIVE

## 2014-01-07 LAB — POCT URINALYSIS DIPSTICK
Bilirubin, UA: NEGATIVE
Glucose, UA: NEGATIVE
Ketones, UA: NEGATIVE
Nitrite, UA: NEGATIVE
PH UA: 5
Spec Grav, UA: <=1.005
UROBILINOGEN UA: 0.2

## 2014-01-07 MED ORDER — AMOXICILLIN-POT CLAVULANATE 875-125 MG PO TABS: 1.0000 | ORAL_TABLET | Freq: Two times a day (BID) | ORAL | Status: DC

## 2014-01-07 NOTE — Progress Notes (Signed)
Subjective:    [name], female    DOB: [birth year], 73 y.o.   MRN: [id number]  HPI  Pt in today reporting urinary symptoms. Pt states she feel little bit better with the antibiotic I gave her. I only gave her 3 days of antibiotic cipro. She grew out multiple morphocytes types.  Pt states she does not want any more antibiotics. But symptoms below.  Dysuria- some still and after she urinates.  Frequent urination-2-3 times at night. Hesitancy-some Suprapubic pressure-still suprapubic pressure. Fever-no. chills-no Nausea-no. Vomiting-no CVA pain-no History of UTI-yes.  Gross hematuria-no.  Also two days mild st and mild cough. No  Other uri symptoms.  Past Medical History  Diagnosis Date  . Gastroparesis   . Pure hypercholesterolemia   . Headache(784.0)   . Unspecified essential hypertension   . Esophageal reflux   . Type II or unspecified type diabetes mellitus without mention of complication, not stated as uncontrolled   . Arthritis     Spinal Osteoarthritis  . Diverticulosis 08/30/2000    Colonoscopy   . Gastric polyp     Fundic Gland  . Anxiety   . Cataract   . Heart murmur     Echocardiogram 2/11: EF 60-65%, mild LAE, grade 1 diastolic dysfunction, aortic valve sclerosis, mean gradient 9 mm of mercury, PASP 34  . Chest pain     Myoview 1/11: EF 82%, no ischemia.  . Carcinoid tumor of stomach   . PSVT (paroxysmal supraventricular tachycardia)   . Leg swelling     bilateral  . Chronic kidney disease     Left kidney smaller than right kidney  . Cancer   . PONV (postoperative nausea and vomiting)     pt states only needs small amount of anesthesia  . Stroke     tia, 2014  . Iron deficiency anemia, unspecified   . Encounter for preventative adult health care exam with abnormal findings 09/14/2013  . Unspecified hereditary and idiopathic peripheral neuropathy 10/29/2013  . Diabetic peripheral neuropathy 10/29/2013    History   Social  History  . Marital Status: Married    Spouse Name: N/A    Number of Children: 0  . Years of Education: college   Occupational History  . Retail    Social History Main Topics  . Smoking status: Never Smoker   . Smokeless tobacco: Never Used     Comment: Daily Caffeine Use -1  . Alcohol Use: No  . Drug Use: No  . Sexual Activity: No     Comment: lives alone, no dietary restrictions except avoid fresh veg, fruit, whole grains   Other Topics Concern  . Not on file   Social History Narrative   Patient is married ([name]) and lives with her husband.   Patient does not have any children.   Patient is right-handed.   Patient has a BA degree.   One caffeine drink daily     Past Surgical History  Procedure Laterality Date  . Cholecystectomy  1993  . Tonsillectomy    . Colonoscopy  11/11/2010    diverticulosis  . Esophagogastroduodenoscopy  08/29/2010; 09/15/2010    Carcinoid tumor less than 1 cm in July 2012 not seen in August 2012 , gastritis, fundic gland polyps  . Eus  12/15/2010    Procedure: UPPER ENDOSCOPIC ULTRASOUND (EUS) LINEAR;  Surgeon: Owens Loffler, MD;  Location: [facility];  Service: Endoscopy;  Laterality: N/A;  . Esophagogastroduodenoscopy  05/16/2011  . Esophagogastroduodenoscopy  06/14/2012  .  Dilatation & currettage/hysteroscopy with resectocope N/A 02/25/2013    Procedure: Attempted hysteroscopy with uterine perforation;  Surgeon: Jamey Reas de Berton Lan, MD;  Location: [facility];  Service: Gynecology;  Laterality: N/A;  . Laparoscopy N/A 02/25/2013    Procedure: Cystoscopy and laparoscopy with fulguration of uterine serosa;  Surgeon: Jamey Reas de Berton Lan, MD;  Location: [facility];  Service: Gynecology;  Laterality: N/A;    Family History  Problem Relation Age of Onset  . Diabetes Mother   . Stroke Father     deceased age 50  . Heart disease Sister     deceased MI age 54  . Heart disease Brother     deceased MI age 53  . Colon cancer Neg  Hx   . Esophageal cancer Neg Hx   . Stomach cancer Neg Hx   . Rectal cancer Neg Hx   . Diabetes Maternal Grandmother   . Hypertension Paternal Grandmother   . Diabetes Sister   . Heart disease Sister   . Hypertension Sister   . Hyperlipidemia Sister   . Diabetes Sister   . Heart disease Sister   . Hypertension Sister   . Hyperlipidemia Sister   . Diabetes Brother   . Heart disease Brother   . Hypertension Brother   . Hyperlipidemia Brother     Allergies  Allergen Reactions  . Tramadol     Dizziness     Current Outpatient Prescriptions on File Prior to Visit  Medication Sig Dispense Refill  . acetaminophen (TYLENOL) 500 MG tablet Take 2 tablets (1,000 mg total) by mouth every 6 (six) hours as needed for moderate pain. 30 tablet 0  . ALPRAZolam (XANAX) 0.25 MG tablet Take 1 tablet (0.25 mg total) by mouth 3 (three) times daily as needed for anxiety. 30 tablet 0  . amLODipine (NORVASC) 5 MG tablet Take 5 mg by mouth at bedtime.    Marland Kitchen aspirin 81 MG tablet Take 81 mg by mouth daily.    . calcium-vitamin D (OSCAL WITH D) 500-200 MG-UNIT per tablet Take 1 tablet by mouth.    . cholecalciferol (VITAMIN D) 1000 UNITS tablet Take 1,000 Units by mouth daily.      . ciprofloxacin (CIPRO) 250 MG tablet Take 1 tablet (250 mg total) by mouth 2 (two) times daily. 6 tablet 0  . dicyclomine (BENTYL) 10 MG capsule Take 1 capsule (10 mg total) by mouth every 6 (six) hours as needed (abdominal pain- may take 2 capsules if 1 ineffective). 90 capsule 5  . esomeprazole (NEXIUM) 40 MG capsule Take 1 capsule (40 mg total) by mouth daily before breakfast. 90 capsule 3  . ferrous sulfate 325 (65 FE) MG tablet Take 1 tablet (325 mg total) by mouth 2 (two) times daily with a meal. 60 tablet 3  . folic acid-pyridoxine-cyancobalamin (FOLBIC) 2.5-25-2 MG TABS Take 1 tablet by mouth daily. 90 tablet 1  . metFORMIN (GLUCOPHAGE-XR) 500 MG 24 hr tablet Take 2 tablets (1,000 mg total) by mouth 2 (two) times daily.  180 tablet 1  . metoCLOPramide (REGLAN) 5 MG tablet Take 5 mg by mouth 2 (two) times daily.    . metoprolol (LOPRESSOR) 50 MG tablet Take 1 tablet (50 mg total) by mouth 2 (two) times   daily. 180 tablet 3  . Multiple Vitamin (MULTIVITAMIN) tablet Take 1 tablet by mouth daily.      . ondansetron (ZOFRAN) 4 MG tablet Take 4 mg by mouth every 4 (four) hours  as needed. For nausea      . phenazopyridine (PYRIDIUM) 200 MG tablet Take 1 tablet (200 mg total) by mouth 3 (three) times daily as needed for pain. 6 tablet 0  . potassium chloride (K-DUR,KLOR-CON) 10 MEQ tablet Take 1 tablet (10 mEq total) by mouth daily. 90 tablet 3  . Propylene Glycol (SYSTANE BALANCE OP) Apply 1 drop to eye 2 (two) times daily.    . rifaximin (XIFAXAN) 550 MG TABS tablet Take 1 tablet (550 mg total) by mouth 3 (three) times daily. 42 tablet 0  . rosuvastatin (CRESTOR) 10 MG tablet Take 1 tablet (10 mg total) by mouth daily. 30 tablet 11  . valsartan (DIOVAN) 320 MG tablet TAKE 1 TABLET (320 MG TOTAL) BY MOUTH DAILY. 90 tablet 1   No current facility-administered medications on file prior to visit.    BP 147/79 mmHg  Pulse 65  Temp(Src) 98.6 F (37 C) (Oral)  Ht 5\' 1"  (1.549 m)  Wt 178 lb 9.6 oz (81.012 kg)  BMI 33.76 kg/m2  SpO2 100%  LMP 02/07/1992    Review of Systems  Constitutional: Negative for fever, chills and fatigue.  HENT: Positive for sore throat.   Respiratory: Positive for cough. Negative for choking and wheezing.   Cardiovascular: Negative for chest pain and palpitations.  Gastrointestinal: Negative.   Genitourinary: Positive for dysuria and frequency. Negative for urgency, hematuria, flank pain, vaginal pain and pelvic pain.  Musculoskeletal: Negative for back pain.  Hematological: Negative for adenopathy. Does not bruise/bleed easily.       Objective:   Physical Exam   General  Mental Status- Alert. Orientation- Orientation x 4.   Skin General:- Normal. Moisture- Dry. Temperature-  Warm.  HEENT Head- normal. Nose-normal Ears- normal Throat- mild red. No tonsillar hypertrophy. Nose- no sinus pressure.  Neck Neck- Supple.No lymphadenopathy.   Heart Ausculation-RRR  Lungs Ausculation- Clear, even, unlabored bilaterlly.    Abdomen Palpation/Percussion: Palpation and Percussion of the abdomen reveal- Tender, No Rebound tenderness, No Rigidity(guarding), No Palpable abdominal masses and No jar tenderness. Faint  suprapubic tenderness. Liver:-Normal. Spleen:- Normal. Other Characteristics- very faint Costovertebral angle tenderness- Left or Costovertebral angle tenderness- Right.  Auscultation: Auscultation of the abdomen reveals- Bowel Sounds normal.        Assessment & Plan:

## 2014-01-07 NOTE — Progress Notes (Signed)
Pre visit review using our clinic review tool, if applicable. No additional management support is needed unless otherwise documented below in the visit note. 

## 2014-01-07 NOTE — Assessment & Plan Note (Signed)
Possible  urinary tract infection despite treatment last week. Will send out culture again and hopefully done clean catch.  I am prescribing antibiotic for the probable infection. Hydrate well. During the interim if your signs and symptoms worsen rather than improving please notify us. We will notify your when the culture results are back.  Will try to expedite referral to urologist. Send message to referral staff.

## 2014-01-07 NOTE — Patient Instructions (Addendum)
Your appear to have possible  urinary tract infection despite treatment last week. Will send out culture again and hopefully done clean catch.  I am prescribing antibiotic for the probable infection. Hydrate well. During the interim if your signs and symptoms worsen rather than improving please notify us. We will notify your when the culture results are back.   Follow up in 7 days or as needed.   The antibiotic I wrote you today is one that can cover throat infections.  I will put in referral to urologist also since by history you may have interstitial cystitis.

## 2014-01-07 NOTE — Assessment & Plan Note (Signed)
Rapid strep negative. Likely viral.

## 2014-01-08 ENCOUNTER — Encounter (HOSPITAL_COMMUNITY): Payer: Medicare Other

## 2014-01-09 ENCOUNTER — Telehealth: Payer: Self-pay | Admitting: Family Medicine

## 2014-01-09 LAB — URINE CULTURE
Colony Count: NO GROWTH
Organism ID, Bacteria: NO GROWTH

## 2014-01-09 NOTE — Telephone Encounter (Signed)
Caller name: [name] Relation to pt: self Call back number:  [phone]  Pharmacy:  Reason for call:   Patient requesting culture results. [name] for this

## 2014-01-12 ENCOUNTER — Encounter (HOSPITAL_COMMUNITY): Payer: Self-pay | Admitting: Obstetrics and Gynecology

## 2014-01-12 ENCOUNTER — Other Ambulatory Visit: Payer: Self-pay

## 2014-01-12 MED ORDER — ESOMEPRAZOLE MAGNESIUM 40 MG PO CPDR: 40.0000 mg | DELAYED_RELEASE_CAPSULE | Freq: Every day | ORAL | Status: DC

## 2014-01-13 NOTE — Telephone Encounter (Signed)
Spoke with patient regarding results and referral to urology.

## 2014-01-14 ENCOUNTER — Other Ambulatory Visit: Payer: Self-pay | Admitting: Cardiology

## 2014-01-14 DIAGNOSIS — E7800 Pure hypercholesterolemia, unspecified: Secondary | ICD-10-CM

## 2014-01-14 MED ORDER — ROSUVASTATIN CALCIUM 10 MG PO TABS: 10.0000 mg | ORAL_TABLET | Freq: Every day | ORAL | Status: DC

## 2014-01-14 NOTE — Telephone Encounter (Signed)
Pt need a new prescription for her Crestor. Please call or send to [facility].

## 2014-01-16 ENCOUNTER — Telehealth (HOSPITAL_COMMUNITY): Payer: Self-pay

## 2014-01-16 ENCOUNTER — Other Ambulatory Visit: Payer: Self-pay | Admitting: *Deleted

## 2014-01-16 DIAGNOSIS — E7800 Pure hypercholesterolemia, unspecified: Secondary | ICD-10-CM

## 2014-01-16 MED ORDER — ROSUVASTATIN CALCIUM 10 MG PO TABS: 10.0000 mg | ORAL_TABLET | Freq: Every day | ORAL | Status: DC

## 2014-01-16 NOTE — Telephone Encounter (Signed)
Encounter complete. 

## 2014-01-19 ENCOUNTER — Telehealth: Payer: Self-pay | Admitting: Obstetrics and Gynecology

## 2014-01-19 ENCOUNTER — Ambulatory Visit (INDEPENDENT_AMBULATORY_CARE_PROVIDER_SITE_OTHER): Payer: Medicare Other | Admitting: Obstetrics and Gynecology

## 2014-01-19 ENCOUNTER — Encounter: Payer: Self-pay | Admitting: Obstetrics and Gynecology

## 2014-01-19 ENCOUNTER — Telehealth: Payer: Self-pay | Admitting: Cardiology

## 2014-01-19 VITALS — BP 124/70 | HR 70 | Ht 60.0 in | Wt 176.0 lb

## 2014-01-19 DIAGNOSIS — N644 Mastodynia: Secondary | ICD-10-CM

## 2014-01-19 DIAGNOSIS — I6529 Occlusion and stenosis of unspecified carotid artery: Secondary | ICD-10-CM

## 2014-01-19 DIAGNOSIS — E7800 Pure hypercholesterolemia, unspecified: Secondary | ICD-10-CM

## 2014-01-19 DIAGNOSIS — R319 Hematuria, unspecified: Secondary | ICD-10-CM

## 2014-01-19 DIAGNOSIS — N390 Urinary tract infection, site not specified: Secondary | ICD-10-CM

## 2014-01-19 LAB — POCT URINALYSIS DIPSTICK
Bilirubin, UA: NEGATIVE
GLUCOSE UA: NEGATIVE
KETONES UA: NEGATIVE
Leukocytes, UA: NEGATIVE
Nitrite, UA: NEGATIVE
Protein, UA: NEGATIVE
UROBILINOGEN UA: NEGATIVE
pH, UA: 5

## 2014-01-19 MED ORDER — ROSUVASTATIN CALCIUM 10 MG PO TABS: 10.0000 mg | ORAL_TABLET | Freq: Every day | ORAL | Status: DC

## 2014-01-19 NOTE — Telephone Encounter (Signed)
Samples provided and left at front desk. Patient aware.

## 2014-01-19 NOTE — Progress Notes (Signed)
[name], female   DOB: 01/06/1941, 73 y.o.   MRN: [id number] GYNECOLOGY VISIT  PCP:  Vivien Rossetti, MD  Referring provider:   HPI: 73 y.o.   Married  Caucasian  female   G0P0 with Patient's last menstrual period was 02/07/1992.   here for right breast pain x3 days.   Painful to the touch.  No rash on skin.  No breast trauma.  3D mammogram normal in March 2015.  BI-RADS1.  History of calcifications.   Took Rifaximin for IBS. Had a bladder infection and was treated with ampillin and infection cleared.  Had continue discomfort and was treated with a second antibiotic. Had another culture again and the final result was negative on 01/07/14.   Has a stress test scheduled this week.  Had an episode of SVT.   In January is due for pelvic ultrasound to follow thickening of endometrium which is being followed conservatively.  Had a uterine perforation during an attempted dilation and curettage. Procedure was aborted due to inability to get inside the endometrial canal.  Patient has had GYN ONC consultation who is recommending periodic ultrasound and also re-evaluation if any postmenopausal bleeding were to occur.   Urine Dip: 1+ RBC's  GYNECOLOGIC HISTORY: Patient's last menstrual period was 02/07/1992. Sexually active:  no Partner preference: female Contraception:postmenopausal   Menopausal hormone therapy: no DES exposure:   no Blood transfusions: no   Sexually transmitted diseases:   no GYN procedures and prior surgeries: Laparoscopy 1993 (incidentally had gallbladder removed) Last mammogram: 04-11-13 heterogeneously dense/nl:[facility]                Last pap and high risk HPV testing:    History of abnormal pap smear:  07-13-10 wnl   OB History    Gravida Para Term Preterm AB TAB SAB Ectopic Multiple Living   0                LIFESTYLE: Exercise:               Tobacco: no Alcohol:   no Drug use:  no  Patient Active Problem List   Diagnosis  Date Noted  . Dysuria 01/07/2014  . Acute pharyngitis 01/07/2014  . UTI (urinary tract infection) 12/31/2013  . Cystitis 11/26/2013  . Acute cystitis without hematuria 11/23/2013  . RLS (restless legs syndrome) 11/23/2013  . Elevated troponin 11/13/2013  . Chest pain 11/13/2013  . Diabetic peripheral neuropathy 10/29/2013  . Neck pain on left side 10/29/2013  . Left-sided thoracic back pain 10/06/2013  . Encounter for preventative adult health care exam with abnormal findings 09/14/2013  . Iron deficiency anemia, unspecified   . Abdominal pain, unspecified site 05/28/2013  . Status post laparoscopy 02/25/2013  . GERD (gastroesophageal reflux disease) 01/09/2013  . Amaurosis fugax of left eye 10/16/2012  . Low back pain 06/03/2012  . Vitamin D deficiency 03/11/2012  . Bilateral hand pain 10/20/2011  . Encounter for long-term (current) use of other medications 10/20/2011  . IBS (irritable bowel syndrome) 08/14/2011  . TIA (transient ischemic attack) 02/10/2011  . Abnormal brain MRI 01/19/2011  . Allergic rhinitis 10/01/2010  . Myalgia 09/30/2010  . 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 FUNDIC GLAND POLYPS OF STOMACH 03/18/2010  . EPIGASTRIC PAIN chronic 03/18/2010  . ARTHRALGIA 03/17/2009  . SYSTOLIC MURMUR 44/31/5400  . Paroxysmal supraventricular tachycardia 01/12/2009  . PLANTAR FASCIITIS 06/08/2008  . CHEST PAIN 05/18/2008  . Gastroparesis 12/18/2007  . HYPERCHOLESTEROLEMIA  06/11/2007  . DM (diabetes mellitus), type 2 09/05/2006  . Essential hypertension 09/05/2006    Past Medical History  Diagnosis Date  . Gastroparesis   . Pure hypercholesterolemia   . Headache(784.0)   . Unspecified essential hypertension   . Esophageal reflux   . Type II or unspecified type diabetes mellitus without mention of complication, not stated as uncontrolled   . Arthritis     Spinal Osteoarthritis  . Diverticulosis 08/30/2000    Colonoscopy   . Gastric polyp      Fundic Gland  . Anxiety   . Cataract   . Heart murmur     Echocardiogram 2/11: EF 60-65%, mild LAE, grade 1 diastolic dysfunction, aortic valve sclerosis, mean gradient 9 mm of mercury, PASP 34  . Chest pain     Myoview 1/11: EF 82%, no ischemia.  . Carcinoid tumor of stomach   . PSVT (paroxysmal supraventricular tachycardia)   . Leg swelling     bilateral  . Chronic kidney disease     Left kidney smaller than right kidney  . Cancer   . PONV (postoperative nausea and vomiting)     pt states only needs small amount of anesthesia  . Stroke     tia, 2014  . Iron deficiency anemia, unspecified   . Encounter for preventative adult health care exam with abnormal findings 09/14/2013  . Unspecified hereditary and idiopathic peripheral neuropathy 10/29/2013  . Diabetic peripheral neuropathy 10/29/2013    Past Surgical History  Procedure Laterality Date  . Cholecystectomy  1993  . Tonsillectomy    . Colonoscopy  11/11/2010    diverticulosis  . Esophagogastroduodenoscopy  08/29/2010; 09/15/2010    Carcinoid tumor less than 1 cm in July 2012 not seen in August 2012 , gastritis, fundic gland polyps  . Eus  12/15/2010    Procedure: UPPER ENDOSCOPIC ULTRASOUND (EUS) LINEAR;  Surgeon: Owens Loffler, MD;  Location: [facility];  Service: Endoscopy;  Laterality: N/A;  . Esophagogastroduodenoscopy  05/16/2011  . Esophagogastroduodenoscopy  06/14/2012  . Dilatation & currettage/hysteroscopy with resectocope N/A 02/25/2013    Procedure: Attempted hysteroscopy with uterine perforation;  Surgeon: Jamey Reas de Berton Lan, MD;  Location: [facility];  Service: Gynecology;  Laterality: N/A;  . Laparoscopy N/A 02/25/2013    Procedure: Cystoscopy and laparoscopy with fulguration of uterine serosa;  Surgeon: Jamey Reas de Berton Lan, MD;  Location: [facility];  Service: Gynecology;  Laterality: N/A;    Current Outpatient Prescriptions  Medication Sig Dispense Refill  . acetaminophen (TYLENOL) 500 MG  tablet Take 2 tablets (1,000 mg total) by mouth every 6 (six) hours as needed for moderate pain. 30 tablet 0  . ALPRAZolam (XANAX) 0.25 MG tablet Take 1 tablet (0.25 mg total) by mouth 3 (three) times daily as needed for anxiety. 30 tablet 0  . amLODipine (NORVASC) 5 MG tablet Take 5 mg by mouth at bedtime.    Marland Kitchen aspirin 81 MG tablet Take 81 mg by mouth daily.    . calcium-vitamin D (OSCAL WITH D) 500-200 MG-UNIT per tablet Take 1 tablet by mouth.    . cholecalciferol (VITAMIN D) 1000 UNITS tablet Take 1,000 Units by mouth daily.      Marland Kitchen dicyclomine (BENTYL) 10 MG capsule Take 1 capsule (10 mg total) by mouth every 6 (six) hours as needed (abdominal pain- may take 2 capsules if 1 ineffective). 90 capsule 5  . esomeprazole (NEXIUM) 40 MG capsule Take 1 capsule (40 mg total) by mouth  daily before breakfast. 90 capsule 3  . ferrous sulfate 325 (65 FE) MG tablet Take 1 tablet (325 mg total) by mouth 2 (two) times daily with a meal. 60 tablet 3  . folic acid-pyridoxine-cyancobalamin (FOLBIC) 2.5-25-2 MG TABS Take 1 tablet by mouth daily. 90 tablet 1  . metFORMIN (GLUCOPHAGE-XR) 500 MG 24 hr tablet Take 2 tablets (1,000 mg total) by mouth 2 (two) times daily. 180 tablet 1  . metoCLOPramide (REGLAN) 5 MG tablet Take 5 mg by mouth 2 (two) times daily.    . metoprolol (LOPRESSOR) 50 MG tablet Take 1 tablet (50 mg total) by mouth 2 (two) times   daily. 180 tablet 3  . metoprolol (LOPRESSOR) 50 MG tablet Take 50 mg by mouth 2 (two) times daily.    . Multiple Vitamin (MULTIVITAMIN) tablet Take 1 tablet by mouth daily.      . ondansetron (ZOFRAN) 4 MG tablet Take 4 mg by mouth every 4 (four) hours as needed. For nausea      . potassium chloride (K-DUR,KLOR-CON) 10 MEQ tablet Take 1 tablet (10 mEq total) by mouth daily. 90 tablet 3  . rosuvastatin (CRESTOR) 10 MG tablet Take 1 tablet (10 mg total) by mouth daily. 28 tablet 0  . valsartan (DIOVAN) 320 MG tablet TAKE 1 TABLET (320 MG TOTAL) BY MOUTH DAILY. 90  tablet 1   No current facility-administered medications for this visit.     ALLERGIES: Tramadol  Family History  Problem Relation Age of Onset  . Diabetes Mother   . Stroke Father     deceased age 34  . Heart disease Sister     deceased MI age 11  . Heart disease Brother     deceased MI age 27  . Colon cancer Neg Hx   . Esophageal cancer Neg Hx   . Stomach cancer Neg Hx   . Rectal cancer Neg Hx   . Diabetes Maternal Grandmother   . Hypertension Paternal Grandmother   . Diabetes Sister   . Heart disease Sister   . Hypertension Sister   . Hyperlipidemia Sister   . Diabetes Sister   . Heart disease Sister   . Hypertension Sister   . Hyperlipidemia Sister   . Diabetes Brother   . Heart disease Brother   . Hypertension Brother   . Hyperlipidemia Brother     History   Social History  . Marital Status: Married    Spouse Name: N/A    Number of Children: 0  . Years of Education: college   Occupational History  . Retail    Social History Main Topics  . Smoking status: Never Smoker   . Smokeless tobacco: Never Used     Comment: Daily Caffeine Use -1  . Alcohol Use: No  . Drug Use: No  . Sexual Activity: No     Comment: lives alone, no dietary restrictions except avoid fresh veg, fruit, whole grains   Other Topics Concern  . Not on file   Social History Narrative   Patient is married ([name]) and lives with her husband.   Patient does not have any children.   Patient is right-handed.   Patient has a BA degree.   One caffeine drink daily     ROS:  Pertinent items are noted in HPI.  PHYSICAL EXAMINATION:    BP 124/70 mmHg  Pulse 70  Ht 5' (1.524 m)  Wt 176 lb (79.833 kg)  BMI 34.37 kg/m2  LMP 02/07/1992   Wt Readings  from Last 3 Encounters:  01/19/14 176 lb (79.833 kg)  01/07/14 178 lb 9.6 oz (81.012 kg)  12/31/13 177 lb (80.287 kg)     Ht Readings from Last 3 Encounters:  01/19/14 5' (1.524 m)  01/07/14 5\' 1"  (1.549 m)  12/31/13 5\' 1"  (1.549 m)     General appearance: alert, cooperative and appears stated age   Breasts: Inspection negative, No nipple retraction or dimpling, No nipple discharge or bleeding, No axillary or supraclavicular adenopathy, Normal to palpation without dominant masses   ASSESSMENT  Bilateral mastalgia.  Hematuria.  History of thickened endometrium and inability to do EMB or dilation and curettage.   PLAN  Proceed with bilateral diagnostic mammogram and breast ultrasound.  Urine micro and culture.  Due for repeat office pelvic ultrasound in January.   An After Visit Summary was printed and given to the patient.  25 minutes face to face time of which over 50% was spent in counseling.

## 2014-01-19 NOTE — Progress Notes (Signed)
Scheduled patient for bilateral diagnostic mammogram and ultrasound at the [facility] while patient in office. Appointment scheduled for tomorrow at 12:45pm. Patient is agreeable to date and time.

## 2014-01-19 NOTE — Telephone Encounter (Signed)
Patient is having pain and burning in breast. Patient would like an appointment today. Patient last seen 03/10/13.

## 2014-01-19 NOTE — Telephone Encounter (Signed)
Spoke with patient. Patient states that she is having "burning and pain in breast." States burning and pain started in both breasts but is now predominately in the right breast. Denies any swelling or redness in breast. "It is just really painful. Just painful to touch." Advised patient will need to be seen for evaluation. Offered patient appointment today with Milford Cage, FNP but patient declines. "Can't you just talk to [place]. I am sure she will see me." Advised patient Dr.Silva is performing surgery the first part of the morning today. Advised will speak with Lamont Snowball, Nurse manager and return call with appointment options with Dr.Silva. "How long will it be?" Advised I will go speak with her right after phone call and return call as soon as possible.

## 2014-01-19 NOTE — Telephone Encounter (Signed)
Pt called in wanting to know if she could get some samples of Crestor 10mg  when she comes in for her stress test on 12/16. Please call  Thanks

## 2014-01-19 NOTE — Telephone Encounter (Signed)
Spoke with patient. Advised patient can work her in today at [facility] with Dr.Silva (time per Gay Filler). Patient is agreeable to date and time. Appointment scheduled.  Routing to provider for final review. Patient agreeable to disposition. Will close encounter

## 2014-01-20 ENCOUNTER — Ambulatory Visit
Admission: RE | Admit: 2014-01-20 | Discharge: 2014-01-20 | Disposition: A | Discharge status: Home / Self Care | Payer: Medicare Other | Source: Ambulatory Visit | Attending: Obstetrics and Gynecology | Admitting: Obstetrics and Gynecology

## 2014-01-20 DIAGNOSIS — N644 Mastodynia: Secondary | ICD-10-CM

## 2014-01-20 LAB — URINALYSIS, MICROSCOPIC ONLY
Bacteria, UA: NONE SEEN
CRYSTALS: NONE SEEN
Casts: NONE SEEN
Squamous Epithelial / LPF: NONE SEEN

## 2014-01-21 ENCOUNTER — Ambulatory Visit (HOSPITAL_COMMUNITY)
Admission: RE | Admit: 2014-01-21 | Discharge: 2014-01-21 | Disposition: A | Discharge status: Home / Self Care | Payer: Medicare Other | Source: Ambulatory Visit | Attending: Internal Medicine | Admitting: Internal Medicine

## 2014-01-21 DIAGNOSIS — R42 Dizziness and giddiness: Secondary | ICD-10-CM | POA: Insufficient documentation

## 2014-01-21 DIAGNOSIS — I10 Essential (primary) hypertension: Secondary | ICD-10-CM | POA: Diagnosis not present

## 2014-01-21 DIAGNOSIS — I471 Supraventricular tachycardia: Secondary | ICD-10-CM | POA: Insufficient documentation

## 2014-01-21 DIAGNOSIS — Z6833 Body mass index (BMI) 33.0-33.9, adult: Secondary | ICD-10-CM | POA: Insufficient documentation

## 2014-01-21 DIAGNOSIS — R079 Chest pain, unspecified: Secondary | ICD-10-CM | POA: Diagnosis not present

## 2014-01-21 DIAGNOSIS — E119 Type 2 diabetes mellitus without complications: Secondary | ICD-10-CM | POA: Insufficient documentation

## 2014-01-21 DIAGNOSIS — R5383 Other fatigue: Secondary | ICD-10-CM | POA: Diagnosis not present

## 2014-01-21 DIAGNOSIS — Z8249 Family history of ischemic heart disease and other diseases of the circulatory system: Secondary | ICD-10-CM | POA: Insufficient documentation

## 2014-01-21 DIAGNOSIS — G459 Transient cerebral ischemic attack, unspecified: Secondary | ICD-10-CM | POA: Insufficient documentation

## 2014-01-21 DIAGNOSIS — Z8673 Personal history of transient ischemic attack (TIA), and cerebral infarction without residual deficits: Secondary | ICD-10-CM | POA: Insufficient documentation

## 2014-01-21 DIAGNOSIS — R002 Palpitations: Secondary | ICD-10-CM | POA: Insufficient documentation

## 2014-01-21 LAB — URINE CULTURE

## 2014-01-21 MED ORDER — TECHNETIUM TC 99M SESTAMIBI GENERIC - CARDIOLITE
30.6000 | Freq: Once | INTRAVENOUS | Status: AC | PRN
  Administered 2014-01-21: 31 via INTRAVENOUS

## 2014-01-21 MED ORDER — REGADENOSON 0.4 MG/5ML IV SOLN
0.4000 mg | Freq: Once | INTRAVENOUS | Status: AC
  Administered 2014-01-21: 0.4 mg via INTRAVENOUS

## 2014-01-21 MED ORDER — AMINOPHYLLINE 25 MG/ML IV SOLN
150.0000 mg | Freq: Once | INTRAVENOUS | Status: AC
  Administered 2014-01-21: 150 mg via INTRAVENOUS

## 2014-01-21 MED ORDER — TECHNETIUM TC 99M SESTAMIBI GENERIC - CARDIOLITE
10.9000 | Freq: Once | INTRAVENOUS | Status: AC | PRN
  Administered 2014-01-21: 10.9 via INTRAVENOUS

## 2014-01-21 NOTE — Procedures (Addendum)
[place] [address] [place] [address] [phone]  Cardiology Nuclear Med Study  [name] is a 73 y.o. female     MRN : [id number]     DOB: [birth year]  Procedure Date: 01/21/2014  Nuclear Med Background Indication for Stress Test:  [facility] History:  PSVT;Last NUC MPI on 02/2009-nonischemic;EF=82%;ECHO on 10/15/2012-LVEF=60% Cardiac Risk Factors: Family History - CAD, Hypertension, Lipids, NIDDM and TIA  Symptoms:  Chest Pain, Dizziness, Fatigue, Light-Headedness and Palpitations   Nuclear Pre-Procedure Caffeine/Decaff Intake:  9:00pm NPO After: 7:00am   IV Site: R Forearm  IV 0.9% NS with Angio Cath:  22g  Chest Size (in):  n/a IV Started by: Rolene Course, RN  Height: 5\' 1"  (1.549 m)  Cup Size: D  BMI:  Body mass index is 33.46 kg/(m^2). Weight:  177 lb (80.287 kg)   Tech Comments:  n/a    Nuclear Med Study 1 or 2 day study: 1 day  Stress Test Type:  [place] Provider:  Minus Breeding, MD   Resting Radionuclide: Technetium 34m Sestamibi  Resting Radionuclide Dose: 10.9 mCi   Stress Radionuclide:  Technetium 12m Sestamibi  Stress Radionuclide Dose: 30.6 mCi           Stress Protocol Rest HR: 57 Stress HR:92  Rest BP: 142/73 Stress BP: 167/72  Exercise Time (min): n/a METS: n/a          Dose of Adenosine (mg):  n/a Dose of Lexiscan: 0.4 mg  Dose of Atropine (mg): n/a Dose of Dobutamine: n/a mcg/kg/min (at max HR)  Stress Test Technologist: Mellody Memos, CCT Nuclear Technologist: Imagene Riches, CNMT   Rest Procedure:  Myocardial perfusion imaging was performed at rest 45 minutes following the intravenous administration of Technetium 64m Sestamibi. Stress Procedure:  The patient received IV Lexiscan 0.4 mg over 15-seconds.  Technetium 53m Sestamibi injected at 30-seconds.  Patient experienced shortness of breath, shoulder pain, chest tightness, stomach pain and a  burning sensation in both ears. She was administered 150 mg of Aminophylline IV. There were no significant changes with Lexiscan.  Quantitative spect images were obtained after a 45 minute delay.  Transient Ischemic Dilatation (Normal <1.22):  N/A  QGS EDV:  64 ml QGS ESV:  15 ml LV Ejection Fraction: 77%  Rest ECG: NSR - Normal EKG  Stress ECG: No significant change from baseline ECG  QPS Raw Data Images:  significant bowel attenuation artifact Stress Images:  There is decreased uptake in the lateral wall. Rest Images:  There is decreased uptake in the lateral wall. Subtraction (SDS):  No evidence of ischemia.  Impression Exercise Capacity:  Lexiscan with no exercise. BP Response:  Normal blood pressure response. Clinical Symptoms:  Mild chest pain/dyspnea. ECG Impression:  No significant ECG changes with Lexiscan. Comparison with Prior Nuclear Study: No significant change from previous study  Overall Impression:  Low risk stress nuclear study with significant inferloateral bowel attenuation artifact. No reversible ischemia - the inferior wall is obscured by high-signal bowel in the rest images, but the stress images show normal perfusion..  LV Wall Motion:  NL LV Function; NL Wall Motion; EF 77%  Pixie Casino, MD, [facility] Board Certified in Nuclear Cardiology Attending Cardiologist [place] C, MD  01/21/2014 1:13 PM

## 2014-01-26 ENCOUNTER — Telehealth: Payer: Self-pay | Admitting: Emergency Medicine

## 2014-01-26 ENCOUNTER — Other Ambulatory Visit (INDEPENDENT_AMBULATORY_CARE_PROVIDER_SITE_OTHER): Payer: Medicare Other

## 2014-01-26 DIAGNOSIS — E559 Vitamin D deficiency, unspecified: Secondary | ICD-10-CM | POA: Diagnosis not present

## 2014-01-26 DIAGNOSIS — E785 Hyperlipidemia, unspecified: Secondary | ICD-10-CM

## 2014-01-26 DIAGNOSIS — Z0000 Encounter for general adult medical examination without abnormal findings: Secondary | ICD-10-CM

## 2014-01-26 DIAGNOSIS — E119 Type 2 diabetes mellitus without complications: Secondary | ICD-10-CM

## 2014-01-26 DIAGNOSIS — E780 Pure hypercholesterolemia: Secondary | ICD-10-CM

## 2014-01-26 LAB — LIPID PANEL
CHOL/HDL RATIO: 3
Cholesterol: 204 mg/dL — ABNORMAL HIGH (ref 0–200)
HDL: 68.9 mg/dL (ref >39.00)
LDL CALC: 114 mg/dL — AB (ref 0–99)
NONHDL: 135.1
Triglycerides: 107 mg/dL (ref 0.0–149.0)
VLDL: 21.4 mg/dL (ref 0.0–40.0)

## 2014-01-26 LAB — CBC
HCT: 35.2 % — ABNORMAL LOW (ref 36.0–46.0)
Hemoglobin: 11.2 g/dL — ABNORMAL LOW (ref 12.0–15.0)
MCHC: 31.7 g/dL (ref 30.0–36.0)
MCV: 77.1 fl — ABNORMAL LOW (ref 78.0–100.0)
Platelets: 267 10*3/uL (ref 150.0–400.0)
RBC: 4.57 Mil/uL (ref 3.87–5.11)
RDW: 14.8 % (ref 11.5–15.5)
WBC: 6.7 10*3/uL (ref 4.0–10.5)

## 2014-01-26 LAB — RENAL FUNCTION PANEL
Albumin: 4.3 g/dL (ref 3.5–5.2)
BUN: 15 mg/dL (ref 6–23)
CO2: 23 mEq/L (ref 19–32)
CREATININE: 0.6 mg/dL (ref 0.4–1.2)
Calcium: 9.8 mg/dL (ref 8.4–10.5)
Chloride: 99 mEq/L (ref 96–112)
GFR: 106.25 mL/min (ref >60.00)
GLUCOSE: 122 mg/dL — AB (ref 70–99)
Phosphorus: 3.5 mg/dL (ref 2.3–4.6)
Potassium: 4 mEq/L (ref 3.5–5.1)
SODIUM: 132 meq/L — AB (ref 135–145)

## 2014-01-26 LAB — HEMOGLOBIN A1C: Hgb A1c MFr Bld: 7.1 % — ABNORMAL HIGH (ref 4.6–6.5)

## 2014-01-26 LAB — HEPATIC FUNCTION PANEL
ALBUMIN: 4.3 g/dL (ref 3.5–5.2)
ALT: 28 U/L (ref 0–35)
AST: 31 U/L (ref 0–37)
Alkaline Phosphatase: 61 U/L (ref 39–117)
BILIRUBIN DIRECT: 0.1 mg/dL (ref 0.0–0.3)
Total Bilirubin: 0.7 mg/dL (ref 0.2–1.2)
Total Protein: 7.7 g/dL (ref 6.0–8.3)

## 2014-01-26 NOTE — Telephone Encounter (Signed)
-----   Message from [place], MD sent at 01/22/2014  6:10 AM EST ----- Regarding: RE: Mammogram hold  Ok to remove from mammogram hold.   Thank you.  ----- Message -----    From: Karen Chafe, RN    Sent: 01/21/2014   3:21 PM      To: Brook E Amundson de Berton Lan, MD Subject: Mammogram hold                                 Dr. Quincy Simmonds,  Okay to remove from hold?

## 2014-01-27 ENCOUNTER — Telehealth: Payer: Self-pay | Admitting: Family Medicine

## 2014-01-27 LAB — VITAMIN D 25 HYDROXY (VIT D DEFICIENCY, FRACTURES): VITD: 89.77 ng/mL (ref 30.00–100.00)

## 2014-01-27 LAB — TSH: TSH: 1.63 u[IU]/mL (ref 0.35–4.50)

## 2014-01-27 NOTE — Telephone Encounter (Signed)
Caller name: Jilene, Spohr N Relation to pt: self  Call back number: [phone]   Reason for call:  Pt inquiring about lab results taken 01/26/14

## 2014-01-27 NOTE — Telephone Encounter (Signed)
Advised patient no results yet.

## 2014-02-02 ENCOUNTER — Encounter: Payer: Self-pay | Admitting: Family Medicine

## 2014-02-02 ENCOUNTER — Ambulatory Visit (INDEPENDENT_AMBULATORY_CARE_PROVIDER_SITE_OTHER): Payer: Medicare Other | Admitting: Family Medicine

## 2014-02-02 VITALS — BP 122/72 | HR 65 | Temp 97.4°F | Ht 61.0 in | Wt 175.4 lb

## 2014-02-02 DIAGNOSIS — E119 Type 2 diabetes mellitus without complications: Secondary | ICD-10-CM

## 2014-02-02 DIAGNOSIS — G2581 Restless legs syndrome: Secondary | ICD-10-CM | POA: Diagnosis not present

## 2014-02-02 DIAGNOSIS — I471 Supraventricular tachycardia, unspecified: Secondary | ICD-10-CM

## 2014-02-02 DIAGNOSIS — K589 Irritable bowel syndrome without diarrhea: Secondary | ICD-10-CM

## 2014-02-02 DIAGNOSIS — I6529 Occlusion and stenosis of unspecified carotid artery: Secondary | ICD-10-CM

## 2014-02-02 DIAGNOSIS — E669 Obesity, unspecified: Secondary | ICD-10-CM

## 2014-02-02 DIAGNOSIS — K219 Gastro-esophageal reflux disease without esophagitis: Secondary | ICD-10-CM | POA: Diagnosis not present

## 2014-02-02 DIAGNOSIS — I10 Essential (primary) hypertension: Secondary | ICD-10-CM | POA: Diagnosis not present

## 2014-02-02 DIAGNOSIS — E1169 Type 2 diabetes mellitus with other specified complication: Secondary | ICD-10-CM

## 2014-02-02 DIAGNOSIS — E871 Hypo-osmolality and hyponatremia: Secondary | ICD-10-CM

## 2014-02-02 NOTE — Patient Instructions (Signed)
Restart a fiber supplement such as [employer]age or [facility] Colon Health   Irritable Bowel Syndrome Irritable bowel syndrome (IBS) is caused by a disturbance of normal bowel function and is a common digestive disorder. You may also hear this condition called spastic colon, mucous colitis, and irritable colon. There is no cure for IBS. However, symptoms often gradually improve or disappear with a good diet, stress management, and medicine. This condition usually appears in late adolescence or early adulthood. Women develop it twice as often as men. CAUSES  After food has been digested and absorbed in the small intestine, waste material is moved into the large intestine, or colon. In the colon, water and salts are absorbed from the undigested products coming from the small intestine. The remaining residue, or fecal material, is held for elimination. Under normal circumstances, gentle, rhythmic contractions of the bowel walls push the fecal material along the colon toward the rectum. In IBS, however, these contractions are irregular and poorly coordinated. The fecal material is either retained too long, resulting in constipation, or expelled too soon, producing diarrhea. SIGNS AND SYMPTOMS  The most common symptom of IBS is abdominal pain. It is often in the lower left side of the abdomen, but it may occur anywhere in the abdomen. The pain comes from spasms of the bowel muscles happening too much and from the buildup of gas and fecal material in the colon. This pain:  Can range from sharp abdominal cramps to a dull, continuous ache.  Often worsens soon after eating.  Is often relieved by having a bowel movement or passing gas. Abdominal pain is usually accompanied by constipation, but it may also produce diarrhea. The diarrhea often occurs right after a meal or upon waking up in the morning. The stools are often soft, watery, and flecked with mucus. Other symptoms of IBS  include:  Bloating.  Loss of appetite.  Heartburn.  Backache.  Dull pain in the arms or shoulders.  Nausea.  Burping.  Vomiting.  Gas. IBS may also cause symptoms that are unrelated to the digestive system, such as:  Fatigue.  Headaches.  Anxiety.  Shortness of breath.  Trouble concentrating.  Dizziness. These symptoms tend to come and go. DIAGNOSIS  The symptoms of IBS may seem like symptoms of other, more serious digestive disorders. Your health care provider may want to perform tests to exclude these disorders.  TREATMENT Many medicines are available to help correct bowel function or relieve bowel spasms and abdominal pain. Among the medicines available are:  Laxatives for severe constipation and to help restore normal bowel habits.  Specific antidiarrheal medicines to treat severe or lasting diarrhea.  Antispasmodic agents to relieve intestinal cramps. Your health care provider may also decide to treat you with a mild tranquilizer or sedative during unusually stressful periods in your life. Your health care provider may also prescribe antidepressant medicine. The use of this medicine has been shown to reduce pain and other symptoms of IBS. Remember that if any medicine is prescribed for you, you should take it exactly as directed. Make sure your health care provider knows how well it worked for you. HOME CARE INSTRUCTIONS   Take all medicines as directed by your health care provider.  Avoid foods that are high in fat or oils, such as heavy cream, butter, frankfurters, sausage, and other fatty meats.  Avoid foods that make you go to the bathroom, such as fruit, fruit juice, and dairy products.  Cut out carbonated drinks, chewing gum, and "  gassy" foods such as beans and cabbage. This may help relieve bloating and burping.  Eat foods with bran, and drink plenty of liquids with the bran foods. This helps relieve constipation.  Keep track of what foods seem to  bring on your symptoms.  Avoid emotionally charged situations or circumstances that produce anxiety.  Start or continue exercising.  Get plenty of rest and sleep. Document Released: 01/23/2005 Document Revised: 01/28/2013 Document Reviewed: 09/13/2007 [facility] Patient Information 2015 [place], [place]. This information is not intended to replace advice given to you by your health care provider. Make sure you discuss any questions you have with your health care provider.

## 2014-02-02 NOTE — Assessment & Plan Note (Signed)
Well controlled, no changes to meds. Encouraged heart healthy diet such as the DASH diet and exercise as tolerated.  °

## 2014-02-02 NOTE — Assessment & Plan Note (Signed)
Poor sleep recently due to grief. Encouraged to try Melatonin and try a support group for her grief

## 2014-02-02 NOTE — Assessment & Plan Note (Signed)
hgba1c acceptable, minimize simple carbs. Increase exercise as tolerated. Continue current meds 

## 2014-02-02 NOTE — Progress Notes (Signed)
Pre visit review using our clinic review tool, if applicable. No additional management support is needed unless otherwise documented below in the visit note. 

## 2014-02-02 NOTE — Progress Notes (Signed)
[name]  [id number] 1940-10-30 02/02/2014      Progress Note-Follow Up  Subjective  Chief Complaint  Chief Complaint  Patient presents with  . Follow-up    3 mos    HPI  Patient is a 73 y.o. female in today for routine medical care. Patient in today with persistent numerous complaints. She continues to struggle with lower abdominal and intermittent right lower quadrant pain. She has an ultrasound scheduled on January 7 if her OB/GYN. Her bowels are normally and she recently had a urinalysis at her GYN that was negative. She denies dysuria. No fevers or chills, anorexia or nausea. Recent stress testing was unremarkable. No other recent illness but she does continue to struggle with severe grief reaction status post the death of her husband. Declines medications for depression and overall feels she is getting through but acknowledges anhedonia. No suicidal ideation. Denies palp/SOB/HA/congestion/fevers or GU c/o. Taking meds as prescribed  Past Medical History  Diagnosis Date  . Gastroparesis   . Pure hypercholesterolemia   . Headache(784.0)   . Unspecified essential hypertension   . Esophageal reflux   . Type II or unspecified type diabetes mellitus without mention of complication, not stated as uncontrolled   . Arthritis     Spinal Osteoarthritis  . Diverticulosis 08/30/2000    Colonoscopy   . Gastric polyp     Fundic Gland  . Anxiety   . Cataract   . Heart murmur     Echocardiogram 2/11: EF 60-65%, mild LAE, grade 1 diastolic dysfunction, aortic valve sclerosis, mean gradient 9 mm of mercury, PASP 34  . Chest pain     Myoview 1/11: EF 82%, no ischemia.  . Carcinoid tumor of stomach   . PSVT (paroxysmal supraventricular tachycardia)   . Leg swelling     bilateral  . Chronic kidney disease     Left kidney smaller than right kidney  . Cancer   . PONV (postoperative nausea and vomiting)     pt states only needs small amount of anesthesia  . Stroke     tia,  2014  . Iron deficiency anemia, unspecified   . Encounter for preventative adult health care exam with abnormal findings 09/14/2013  . Unspecified hereditary and idiopathic peripheral neuropathy 10/29/2013  . Diabetic peripheral neuropathy 10/29/2013  . Diabetes mellitus type 2 in obese 09/05/2006    Qualifier: Diagnosis of  By: Larose Kells      Past Surgical History  Procedure Laterality Date  . Cholecystectomy  1993  . Tonsillectomy    . Colonoscopy  11/11/2010    diverticulosis  . Esophagogastroduodenoscopy  08/29/2010; 09/15/2010    Carcinoid tumor less than 1 cm in July 2012 not seen in August 2012 , gastritis, fundic gland polyps  . Eus  12/15/2010    Procedure: UPPER ENDOSCOPIC ULTRASOUND (EUS) LINEAR;  Surgeon: Owens Loffler, MD;  Location: [facility];  Service: Endoscopy;  Laterality: N/A;  . Esophagogastroduodenoscopy  05/16/2011  . Esophagogastroduodenoscopy  06/14/2012  . Dilatation & currettage/hysteroscopy with resectocope N/A 02/25/2013    Procedure: Attempted hysteroscopy with uterine perforation;  Surgeon: Jamey Reas de Berton Lan, MD;  Location: [facility];  Service: Gynecology;  Laterality: N/A;  . Laparoscopy N/A 02/25/2013    Procedure: Cystoscopy and laparoscopy with fulguration of uterine serosa;  Surgeon: Jamey Reas de Berton Lan, MD;  Location: [facility];  Service: Gynecology;  Laterality: N/A;    Family History  Problem Relation Age of Onset  .  Diabetes Mother   . Stroke Father     deceased age 86  . Heart disease Sister     deceased MI age 46  . Heart disease Brother     deceased MI age 68  . Colon cancer Neg Hx   . Esophageal cancer Neg Hx   . Stomach cancer Neg Hx   . Rectal cancer Neg Hx   . Diabetes Maternal Grandmother   . Hypertension Paternal Grandmother   . Diabetes Sister   . Heart disease Sister   . Hypertension Sister   . Hyperlipidemia Sister   . Diabetes Sister   . Heart disease Sister   . Hypertension Sister   .  Hyperlipidemia Sister   . Diabetes Brother   . Heart disease Brother   . Hypertension Brother   . Hyperlipidemia Brother     History   Social History  . Marital Status: Married    Spouse Name: N/A    Number of Children: 0  . Years of Education: college   Occupational History  . Retail    Social History Main Topics  . Smoking status: Never Smoker   . Smokeless tobacco: Never Used     Comment: Daily Caffeine Use -1  . Alcohol Use: No  . Drug Use: No  . Sexual Activity: No     Comment: lives alone, no dietary restrictions except avoid fresh veg, fruit, whole grains   Other Topics Concern  . Not on file   Social History Narrative   Patient is married ([name]) and lives with her husband.   Patient does not have any children.   Patient is right-handed.   Patient has a BA degree.   One caffeine drink daily     Current Outpatient Prescriptions on File Prior to Visit  Medication Sig Dispense Refill  . acetaminophen (TYLENOL) 500 MG tablet Take 2 tablets (1,000 mg total) by mouth every 6 (six) hours as needed for moderate pain. 30 tablet 0  . ALPRAZolam (XANAX) 0.25 MG tablet Take 1 tablet (0.25 mg total) by mouth 3 (three) times daily as needed for anxiety. 30 tablet 0  . amLODipine (NORVASC) 5 MG tablet Take 5 mg by mouth at bedtime.    Marland Kitchen aspirin 81 MG tablet Take 81 mg by mouth daily.    . calcium-vitamin D (OSCAL WITH D) 500-200 MG-UNIT per tablet Take 1 tablet by mouth.    . cholecalciferol (VITAMIN D) 1000 UNITS tablet Take 1,000 Units by mouth daily.      Marland Kitchen dicyclomine (BENTYL) 10 MG capsule Take 1 capsule (10 mg total) by mouth every 6 (six) hours as needed (abdominal pain- may take 2 capsules if 1 ineffective). 90 capsule 5  . esomeprazole (NEXIUM) 40 MG capsule Take 1 capsule (40 mg total) by mouth daily before breakfast. 90 capsule 3  . folic acid-pyridoxine-cyancobalamin (FOLBIC) 2.5-25-2 MG TABS Take 1 tablet by mouth daily. 90 tablet 1  . metFORMIN (GLUCOPHAGE-XR)  500 MG 24 hr tablet Take 2 tablets (1,000 mg total) by mouth 2 (two) times daily. 180 tablet 1  . metoCLOPramide (REGLAN) 5 MG tablet Take 5 mg by mouth 2 (two) times daily.    . metoprolol (LOPRESSOR) 50 MG tablet Take 1 tablet (50 mg total) by mouth 2 (two) times   daily. 180 tablet 3  . Multiple Vitamin (MULTIVITAMIN) tablet Take 1 tablet by mouth daily.      . ondansetron (ZOFRAN) 4 MG tablet Take 4 mg by mouth every 4 (  four) hours as needed. For nausea      . potassium chloride (K-DUR,KLOR-CON) 10 MEQ tablet Take 1 tablet (10 mEq total) by mouth daily. 90 tablet 3  . rosuvastatin (CRESTOR) 10 MG tablet Take 1 tablet (10 mg total) by mouth daily. 28 tablet 0  . valsartan (DIOVAN) 320 MG tablet TAKE 1 TABLET (320 MG TOTAL) BY MOUTH DAILY. 90 tablet 1  . ferrous sulfate 325 (65 FE) MG tablet Take 1 tablet (325 mg total) by mouth 2 (two) times daily with a meal. (Patient not taking: Reported on 02/02/2014) 60 tablet 3   No current facility-administered medications on file prior to visit.    Allergies  Allergen Reactions  . Tramadol     Dizziness     Review of Systems  Review of Systems  Constitutional: Positive for malaise/fatigue. Negative for fever.  HENT: Negative for congestion.   Eyes: Negative for discharge.  Respiratory: Negative for shortness of breath.   Cardiovascular: Positive for leg swelling. Negative for chest pain and palpitations.  Gastrointestinal: Positive for abdominal pain. Negative for nausea and diarrhea.  Genitourinary: Negative for dysuria.  Musculoskeletal: Negative for falls.  Skin: Negative for rash.  Neurological: Negative for loss of consciousness and headaches.  Endo/Heme/Allergies: Negative for polydipsia.  Psychiatric/Behavioral: Positive for depression. Negative for suicidal ideas. The patient has insomnia. The patient is not nervous/anxious.     Objective  BP 122/72 mmHg  Pulse 65  Temp(Src) 97.4 F (36.3 C) (Oral)  Ht 5\' 1"  (1.549 m)   Wt 175 lb 6.4 oz (79.561 kg)  BMI 33.16 kg/m2  SpO2 100%  LMP 02/07/1992  Physical Exam  Physical Exam  Constitutional: She is oriented to person, place, and time and well-developed, well-nourished, and in no distress. No distress.  HENT:  Head: Normocephalic and atraumatic.  Eyes: Conjunctivae are normal.  Neck: Neck supple. No thyromegaly present.  Cardiovascular: Normal rate, regular rhythm and normal heart sounds.   No murmur heard. Pulmonary/Chest: Effort normal and breath sounds normal. She has no wheezes.  Abdominal: She exhibits no distension and no mass.  Musculoskeletal: She exhibits no edema.  Lymphadenopathy:    She has no cervical adenopathy.  Neurological: She is alert and oriented to person, place, and time.  Skin: Skin is warm and dry. No rash noted. She is not diaphoretic.  Psychiatric: Memory, affect and judgment normal.    Lab Results  Component Value Date   TSH 1.63 01/26/2014   Lab Results  Component Value Date   WBC 6.7 01/26/2014   HGB 11.2* 01/26/2014   HCT 35.2* 01/26/2014   MCV 77.1* 01/26/2014   PLT 267.0 01/26/2014   Lab Results  Component Value Date   CREATININE 0.6 01/26/2014   BUN 15 01/26/2014   NA 132* 01/26/2014   K 4.0 01/26/2014   CL 99 01/26/2014   CO2 23 01/26/2014   Lab Results  Component Value Date   ALT 28 01/26/2014   AST 31 01/26/2014   ALKPHOS 61 01/26/2014   BILITOT 0.7 01/26/2014   Lab Results  Component Value Date   CHOL 204* 01/26/2014   Lab Results  Component Value Date   HDL 68.90 01/26/2014   Lab Results  Component Value Date   LDLCALC 114* 01/26/2014   Lab Results  Component Value Date   TRIG 107.0 01/26/2014   Lab Results  Component Value Date   CHOLHDL 3 01/26/2014     Assessment & Plan    Essential hypertension Well controlled, no  changes to meds. Encouraged heart healthy diet such as the DASH diet and exercise as tolerated.   Paroxysmal supraventricular tachycardia rrr  today  GERD (gastroesophageal reflux disease) Avoid offending foods, start probiotics. Do not eat large meals in late evening and consider raising head of bed.   Diabetes mellitus type 2 in obese [id number] acceptable, minimize simple carbs. Increase exercise as tolerated. Continue current meds  RLS (restless legs syndrome) Poor sleep recently due to grief. Encouraged to try Melatonin and try a support group for her grief  IBS (irritable bowel syndrome) C/o intermittent lower abdominal pain and right sided pain. Bentyl gives some relief. Discussed need for probiotics and fiber supplements. Has Ultrasound scheduled with GYN soon if pain persists and no cuase found she will let us know and we will consider CT scan

## 2014-02-02 NOTE — Assessment & Plan Note (Signed)
Avoid offending foods, start probiotics. Do not eat large meals in late evening and consider raising head of bed.  

## 2014-02-02 NOTE — Assessment & Plan Note (Signed)
rrr today 

## 2014-02-03 LAB — RENAL FUNCTION PANEL
ALBUMIN: 4.1 g/dL (ref 3.5–5.2)
BUN: 15 mg/dL (ref 6–23)
CO2: 24 meq/L (ref 19–32)
Calcium: 9.5 mg/dL (ref 8.4–10.5)
Chloride: 97 mEq/L (ref 96–112)
Creatinine, Ser: 0.7 mg/dL (ref 0.4–1.2)
GFR: 88.68 mL/min (ref >60.00)
Glucose, Bld: 106 mg/dL — ABNORMAL HIGH (ref 70–99)
PHOSPHORUS: 3.4 mg/dL (ref 2.3–4.6)
POTASSIUM: 4.9 meq/L (ref 3.5–5.1)
Sodium: 130 mEq/L — ABNORMAL LOW (ref 135–145)

## 2014-02-03 NOTE — Assessment & Plan Note (Signed)
C/o intermittent lower abdominal pain and right sided pain. Bentyl gives some relief. Discussed need for probiotics and fiber supplements. Has Ultrasound scheduled with GYN soon if pain persists and no cuase found she will let us know and we will consider CT scan

## 2014-02-05 ENCOUNTER — Telehealth: Payer: Self-pay | Admitting: Family Medicine

## 2014-02-05 NOTE — Telephone Encounter (Signed)
Caller name: Kirley, Jakyrah N Relation to pt: self  Call back number:[phone] Pharmacy:[facility] [address] [place], [place] [address] [phone]    Reason for call:  Pt checking the status of lab results advise pt as of 01/27/14 results were not in yet. Pt requesting a refill valsartan (DIOVAN) 320 MG tablet after the new year.

## 2014-02-09 ENCOUNTER — Other Ambulatory Visit: Payer: Self-pay | Admitting: Cardiology

## 2014-02-10 NOTE — Telephone Encounter (Signed)
LMOM @ 6:58pm @ [phone]) asking the pt to RTC regarding note below.//AB/CMA

## 2014-02-12 ENCOUNTER — Encounter: Payer: Self-pay | Admitting: Obstetrics and Gynecology

## 2014-02-12 ENCOUNTER — Ambulatory Visit (INDEPENDENT_AMBULATORY_CARE_PROVIDER_SITE_OTHER): Payer: Medicare Other | Admitting: Obstetrics and Gynecology

## 2014-02-12 ENCOUNTER — Ambulatory Visit (INDEPENDENT_AMBULATORY_CARE_PROVIDER_SITE_OTHER): Payer: Medicare Other

## 2014-02-12 VITALS — BP 130/70 | Resp 18 | Ht 61.0 in | Wt 176.0 lb

## 2014-02-12 DIAGNOSIS — D259 Leiomyoma of uterus, unspecified: Secondary | ICD-10-CM

## 2014-02-12 DIAGNOSIS — R938 Abnormal findings on diagnostic imaging of other specified body structures: Secondary | ICD-10-CM

## 2014-02-12 DIAGNOSIS — R9389 Abnormal findings on diagnostic imaging of other specified body structures: Secondary | ICD-10-CM

## 2014-02-12 NOTE — Progress Notes (Signed)
Subjective  Patient here for follow up ultrasound recommended by GYN ON[facility] following failed dilation and curettage with uterine serosal perforation on 02/25/13.  Present today with suprapubic discomfort.  History of diverticulosis, gastropathy, carcinoid.  Denies dysuria.  Not sexually active.  Recently widowed.  Denies vaginal bleeding or spotting.   Objective  Pelvic ultrasound - images and report reviewed with patient.   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  EMS 6.34 mm.  Cannot rule out polyp.  (No change from previous showing EMS 7.68 on 08/20/13 ultrasound.) Ovaries not seen.   Patient tender with transvaginal scan and exam ws technically difficult due to this.  Increased bowel pattern noted.      Assessment  Thickening of endometrium unchanged. Probably polyp.  Unable to sample in office due to intolerance of exams. Status post uterine perforation in the OR with laparoscopy to coagulate the uterine serosa.  No postmenopausal bleeding.  Suprapubic discomfort.   Plan  Return for annual exam in 11 months.  Will plan to do pelvic ultrasound yearly.  Patient will call for any bleeding.  If suprapubic pain persists or increases, will need evaluation.  I would suggest with her PCP or GI.  25 minutes face to face time of which over 50% was spent in counseling.   After visit summary to patient.

## 2014-02-16 NOTE — Telephone Encounter (Signed)
LMOM @ 3:53pm @ [phone]) asking the pt to RTC regarding note below.//AB/CMA

## 2014-02-18 NOTE — Telephone Encounter (Signed)
Patient called stating frustration that her blood pressure medications (Valsartan) had taken so long to be refilled.  She stated that the request began before the new year, but was told to wait until 2016 and when she tried back the office was closed.  She stated she "had to fight for her medication" and get the pharmacy to address it "by begging."  She stated that she had still not heard the results of her labs and inquired about her sodium level.  Patient stated that she wanted to file a complaint so that "this doesn't happen again."

## 2014-02-18 NOTE — Telephone Encounter (Signed)
Patient states that she was already helped.

## 2014-02-26 ENCOUNTER — Telehealth: Payer: Self-pay | Admitting: *Deleted

## 2014-02-26 DIAGNOSIS — E871 Hypo-osmolality and hyponatremia: Secondary | ICD-10-CM

## 2014-02-26 NOTE — Telephone Encounter (Signed)
-----   Message from [name], MD sent at 02/03/2014 12:43 PM EST ----- She needs a renal panel and appt for this in 1 month for hyponatremia, I have already sent her a mychart message. Dr b

## 2014-02-26 NOTE — Telephone Encounter (Signed)
Called and spoke with the pt and informed her of the note below.  Pt verbalized understanding.  Lab appt scheduled for (Thurs.03-05-14 @ 8:30am), and future lab ordered and sent.//AB/CMA

## 2014-03-02 ENCOUNTER — Telehealth: Payer: Self-pay | Admitting: Cardiology

## 2014-03-02 MED ORDER — VALSARTAN 320 MG PO TABS: ORAL_TABLET | ORAL | Status: DC

## 2014-03-02 NOTE — Telephone Encounter (Signed)
Refill submitted to patient's preferred pharmacy. Informed patient. Pt voiced understanding, no other stated concerns at this time.  

## 2014-03-02 NOTE — Telephone Encounter (Signed)
°  1. Which medications need to be refilled? Valsartan 325  2. Which pharmacy is medication to be sent to?she now wants it sent to [facility]  3. Do they need a 30 day or 90 day supply? 90  4. Would they like a call back once the medication has been sent to the pharmacy? yes

## 2014-03-05 ENCOUNTER — Other Ambulatory Visit (INDEPENDENT_AMBULATORY_CARE_PROVIDER_SITE_OTHER): Payer: Medicare Other

## 2014-03-05 DIAGNOSIS — E871 Hypo-osmolality and hyponatremia: Secondary | ICD-10-CM

## 2014-03-05 LAB — RENAL FUNCTION PANEL
ALBUMIN: 4.2 g/dL (ref 3.5–5.2)
BUN: 18 mg/dL (ref 6–23)
CHLORIDE: 100 meq/L (ref 96–112)
CO2: 27 mEq/L (ref 19–32)
CREATININE: 0.71 mg/dL (ref 0.40–1.20)
Calcium: 9.8 mg/dL (ref 8.4–10.5)
GFR: 85.78 mL/min (ref >60.00)
GLUCOSE: 123 mg/dL — AB (ref 70–99)
POTASSIUM: 4.4 meq/L (ref 3.5–5.1)
Phosphorus: 3.5 mg/dL (ref 2.3–4.6)
Sodium: 136 mEq/L (ref 135–145)

## 2014-03-10 ENCOUNTER — Ambulatory Visit (INDEPENDENT_AMBULATORY_CARE_PROVIDER_SITE_OTHER): Payer: Medicare Other | Admitting: Physician Assistant

## 2014-03-10 ENCOUNTER — Encounter: Payer: Self-pay | Admitting: Physician Assistant

## 2014-03-10 VITALS — BP 123/69 | HR 64 | Temp 97.9°F | Resp 16 | Ht 61.0 in | Wt 175.0 lb

## 2014-03-10 DIAGNOSIS — M545 Low back pain: Secondary | ICD-10-CM | POA: Diagnosis not present

## 2014-03-10 LAB — POCT URINALYSIS DIPSTICK
Bilirubin, UA: NEGATIVE
Glucose, UA: NEGATIVE
KETONES UA: NEGATIVE
Leukocytes, UA: NEGATIVE
Nitrite, UA: NEGATIVE
PH UA: 6
Protein, UA: NEGATIVE
RBC UA: NEGATIVE
Spec Grav, UA: 1.02
Urobilinogen, UA: 0.2

## 2014-03-10 NOTE — Progress Notes (Signed)
Pre visit review using our clinic review tool, if applicable. No additional management support is needed unless otherwise documented below in the visit note/SLS  

## 2014-03-10 NOTE — Assessment & Plan Note (Signed)
MSK in nature. Topical Aspercreme.  Encouraged 2 500 mg Tylenol every 8 hours over the next few days.  Avoid heavy lifting or overexertion. Urine dip unremarkable.  Follow-up if symptoms do not continue to improve.

## 2014-03-10 NOTE — Patient Instructions (Signed)
Your symptoms seem muscular in nature. Please take 2 tylenol every 6 hours over the next couple of days.  Apply topical Aspercreme to the affected area.  Avoid heavy lifting or overexertion.  Please call or return to clinic if symptoms are not improving.

## 2014-03-10 NOTE — Progress Notes (Signed)
Patient presents to clinic today c/o left-sided low back pain over the past week that is worse with ROM.  Denies trauma or heavy lifting.  States symptoms are improved today. Is concerned due to history of UTI. Denies urinary urgency, frequency, dysuria or hematuria.  Denies flank pain, fever, chills, nausea or vomiting.  Recent BMP reveals good renal function.  Past Medical History  Diagnosis Date  . Gastroparesis   . Pure hypercholesterolemia   . Headache(784.0)   . Unspecified essential hypertension   . Esophageal reflux   . Type II or unspecified type diabetes mellitus without mention of complication, not stated as uncontrolled   . Arthritis     Spinal Osteoarthritis  . Diverticulosis 08/30/2000    Colonoscopy   . Gastric polyp     Fundic Gland  . Anxiety   . Cataract   . Heart murmur     Echocardiogram 2/11: EF 60-65%, mild LAE, grade 1 diastolic dysfunction, aortic valve sclerosis, mean gradient 9 mm of mercury, PASP 34  . Chest pain     Myoview 1/11: EF 82%, no ischemia.  . Carcinoid tumor of stomach   . PSVT (paroxysmal supraventricular tachycardia)   . Leg swelling     bilateral  . Chronic kidney disease     Left kidney smaller than right kidney  . Cancer   . PONV (postoperative nausea and vomiting)     pt states only needs small amount of anesthesia  . Stroke     tia, 2014  . Iron deficiency anemia, unspecified   . Encounter for preventative adult health care exam with abnormal findings 09/14/2013  . Unspecified hereditary and idiopathic peripheral neuropathy 10/29/2013  . Diabetic peripheral neuropathy 10/29/2013  . Diabetes mellitus type 2 in obese 09/05/2006    Qualifier: Diagnosis of  By: Larose Kells      Current Outpatient Prescriptions on File Prior to Visit  Medication Sig Dispense Refill  . acetaminophen (TYLENOL) 500 MG tablet Take 2 tablets (1,000 mg total) by mouth every 6 (six) hours as needed for moderate pain. 30 tablet 0  . amLODipine (NORVASC) 5  MG tablet Take 1 tablet by mouth  daily 90 tablet 0  . aspirin 81 MG tablet Take 81 mg by mouth daily.    . calcium-vitamin D (OSCAL WITH D) 500-200 MG-UNIT per tablet Take 1 tablet by mouth.    . cholecalciferol (VITAMIN D) 1000 UNITS tablet Take 1,000 Units by mouth daily.      Marland Kitchen dicyclomine (BENTYL) 10 MG capsule Take 1 capsule (10 mg total) by mouth every 6 (six) hours as needed (abdominal pain- may take 2 capsules if 1 ineffective). 90 capsule 5  . esomeprazole (NEXIUM) 40 MG capsule Take 1 capsule (40 mg total) by mouth daily before breakfast. 90 capsule 3  . ferrous sulfate 325 (65 FE) MG tablet Take 1 tablet (325 mg total) by mouth 2 (two) times daily with a meal. 60 tablet 3  . folic acid-pyridoxine-cyancobalamin (FOLBIC) 2.5-25-2 MG TABS Take 1 tablet by mouth daily. 90 tablet 1  . metFORMIN (GLUCOPHAGE-XR) 500 MG 24 hr tablet Take 2 tablets (1,000 mg total) by mouth 2 (two) times daily. 180 tablet 1  . metoCLOPramide (REGLAN) 5 MG tablet Take 5 mg by mouth 2 (two) times daily.    . metoprolol (LOPRESSOR) 50 MG tablet Take 1 tablet (50 mg total) by mouth 2 (two) times   daily. 180 tablet 3  . Multiple Vitamin (MULTIVITAMIN) tablet Take 1  tablet by mouth daily.      . ondansetron (ZOFRAN) 4 MG tablet Take 4 mg by mouth every 4 (four) hours as needed. For nausea      . potassium chloride (K-DUR,KLOR-CON) 10 MEQ tablet Take 1 tablet (10 mEq total) by mouth daily. 90 tablet 3  . rosuvastatin (CRESTOR) 10 MG tablet Take 1 tablet (10 mg total) by mouth daily. 28 tablet 0  . valsartan (DIOVAN) 320 MG tablet TAKE 1 TABLET (320 MG TOTAL) BY MOUTH DAILY. 90 tablet 1   No current facility-administered medications on file prior to visit.    Allergies  Allergen Reactions  . Tramadol     Dizziness     Family History  Problem Relation Age of Onset  . Diabetes Mother   . Stroke Father     deceased age 28  . Heart disease Sister     deceased MI age 10  . Heart disease Brother      deceased MI age 55  . Colon cancer Neg Hx   . Esophageal cancer Neg Hx   . Stomach cancer Neg Hx   . Rectal cancer Neg Hx   . Diabetes Maternal Grandmother   . Hypertension Paternal Grandmother   . Diabetes Sister   . Heart disease Sister   . Hypertension Sister   . Hyperlipidemia Sister   . Diabetes Sister   . Heart disease Sister   . Hypertension Sister   . Hyperlipidemia Sister   . Diabetes Brother   . Heart disease Brother   . Hypertension Brother   . Hyperlipidemia Brother     History   Social History  . Marital Status: Married    Spouse Name: N/A    Number of Children: 0  . Years of Education: college   Occupational History  . Retail    Social History Main Topics  . Smoking status: Never Smoker   . Smokeless tobacco: Never Used     Comment: Daily Caffeine Use -1  . Alcohol Use: No  . Drug Use: No  . Sexual Activity: No     Comment: lives alone, no dietary restrictions except avoid fresh veg, fruit, whole grains   Other Topics Concern  . None   Social History Narrative   Patient is married Tourist information centre manager) and lives with her husband.   Patient does not have any children.   Patient is right-handed.   Patient has a BA degree.   One caffeine drink daily    Review of Systems - See HPI.  All other ROS are negative.  BP 123/69 mmHg  Pulse 64  Temp(Src) 97.9 F (36.6 C) (Oral)  Resp 16  Ht 5\' 1"  (1.549 m)  Wt 175 lb (79.379 kg)  BMI 33.08 kg/m2  SpO2 100%  LMP 02/07/1992  Physical Exam  Constitutional: She is oriented to person, place, and time and well-developed, well-nourished, and in no distress.  HENT:  Head: Normocephalic and atraumatic.  Eyes: Conjunctivae are normal.  Cardiovascular: Normal rate, regular rhythm, normal heart sounds and intact distal pulses.   Pulmonary/Chest: Effort normal and breath sounds normal. No respiratory distress. She has no wheezes. She has no rales. She exhibits no tenderness.  Abdominal: Soft. Bowel sounds are normal. She  exhibits no distension and no mass. There is no tenderness. There is no rebound and no guarding.  Musculoskeletal:       Back:  Neurological: She is alert and oriented to person, place, and time.  Skin: Skin is warm and  dry. No rash noted.  Psychiatric: Affect normal.  Vitals reviewed.   Recent Results (from the past 2160 hour(s))  Urine Culture     Status: None   Collection Time: 12/31/13  4:55 PM  Result Value Ref Range   Colony Count 50,000 COLONIES/ML    Organism ID, Bacteria Multiple bacterial morphotypes present, none    Organism ID, Bacteria predominant. Suggest appropriate recollection if     Organism ID, Bacteria clinically indicated.   POCT Urinalysis Dipstick     Status: Abnormal   Collection Time: 12/31/13  5:00 PM  Result Value Ref Range   Color, UA Pale yellow    Clarity, UA Clear    Glucose, UA Neg    Bilirubin, UA Neg    Ketones, UA Neg    Spec Grav, UA <=1.005    Blood, UA Small    pH, UA 6.0    Protein, UA Neg    Urobilinogen, UA 0.2    Nitrite, UA Neg    Leukocytes, UA moderate (2+)   POCT Urinalysis Dipstick     Status: Abnormal   Collection Time: 01/07/14  3:35 PM  Result Value Ref Range   Color, UA Pale yellow    Clarity, UA Clear    Glucose, UA Neg    Bilirubin, UA Neg    Ketones, UA Neg    Spec Grav, UA <=1.005    Blood, UA Small    pH, UA 5.0    Protein, UA Trace    Urobilinogen, UA 0.2    Nitrite, UA Neg    Leukocytes, UA Trace   POCT rapid strep A     Status: Normal   Collection Time: 01/07/14  3:35 PM  Result Value Ref Range   Rapid Strep A Screen Negative Negative  Urine Culture     Status: None   Collection Time: 01/07/14  3:45 PM  Result Value Ref Range   Colony Count NO GROWTH    Organism ID, Bacteria NO GROWTH   POCT Urinalysis Dipstick     Status: Abnormal   Collection Time: 01/19/14  4:35 PM  Result Value Ref Range   Color, UA yellow    Clarity, UA cloudy    Glucose, UA n    Bilirubin, UA n    Ketones, UA n    Spec  Grav, UA     Blood, UA 1+    pH, UA 5.0    Protein, UA n    Urobilinogen, UA negative    Nitrite, UA n    Leukocytes, UA Negative   Urine Culture     Status: None   Collection Time: 01/19/14  4:38 PM  Result Value Ref Range   Colony Count 9,000 COLONIES/ML    Organism ID, Bacteria Insignificant Growth   Urine Microscopic Only     Status: None   Collection Time: 01/19/14  4:38 PM  Result Value Ref Range   Squamous Epithelial / LPF NONE SEEN RARE   Crystals NONE SEEN NONE SEEN   Casts NONE SEEN NONE SEEN   WBC, UA 0-2 <3 WBC/hpf   RBC / HPF 0-2 <3 RBC/hpf   Bacteria, UA NONE SEEN RARE  Lipid panel     Status: Abnormal   Collection Time: 01/26/14  8:23 AM  Result Value Ref Range   Cholesterol 204 (H) 0 - 200 mg/dL    Comment: ATP III Classification       Desirable:  < 200 mg/dL  Borderline High:  200 - 239 mg/dL          High:  > = 240 mg/dL   Triglycerides 107.0 0.0 - 149.0 mg/dL    Comment: Normal:  <150 mg/dLBorderline High:  150 - 199 mg/dL   HDL 68.90 >39.00 mg/dL   VLDL 21.4 0.0 - 40.0 mg/dL   LDL Cholesterol 114 (H) 0 - 99 mg/dL   Total CHOL/HDL Ratio 3     Comment:                Men          Women1/2 Average Risk     3.4          3.3Average Risk          5.0          4.42X Average Risk          9.6          7.13X Average Risk          15.0          11.0                       NonHDL 135.10     Comment: NOTE:  Non-HDL goal should be 30 mg/dL higher than patient's LDL goal (i.e. LDL goal of < 70 mg/dL, would have non-HDL goal of < 100 mg/dL)  Renal Function Panel     Status: Abnormal   Collection Time: 01/26/14  8:23 AM  Result Value Ref Range   Sodium 132 (L) 135 - 145 mEq/L   Potassium 4.0 3.5 - 5.1 mEq/L   Chloride 99 96 - 112 mEq/L   CO2 23 19 - 32 mEq/L   Calcium 9.8 8.4 - 10.5 mg/dL   Albumin 4.3 3.5 - 5.2 g/dL   BUN 15 6 - 23 mg/dL   Creatinine, Ser 0.6 0.4 - 1.2 mg/dL   Glucose, Bld 122 (H) 70 - 99 mg/dL   Phosphorus 3.5 2.3 - 4.6 mg/dL   GFR  106.25 >60.00 mL/min  CBC     Status: Abnormal   Collection Time: 01/26/14  8:23 AM  Result Value Ref Range   WBC 6.7 4.0 - 10.5 K/uL   RBC 4.57 3.87 - 5.11 Mil/uL   Platelets 267.0 150.0 - 400.0 K/uL   Hemoglobin 11.2 (L) 12.0 - 15.0 g/dL   HCT 35.2 (L) 36.0 - 46.0 %   MCV 77.1 (L) 78.0 - 100.0 fl   MCHC 31.7 30.0 - 36.0 g/dL   RDW 14.8 11.5 - 15.5 %  TSH     Status: None   Collection Time: 01/26/14  8:23 AM  Result Value Ref Range   TSH 1.63 0.35 - 4.50 uIU/mL  Hepatic function panel     Status: None   Collection Time: 01/26/14  8:23 AM  Result Value Ref Range   Total Bilirubin 0.7 0.2 - 1.2 mg/dL   Bilirubin, Direct 0.1 0.0 - 0.3 mg/dL   Alkaline Phosphatase 61 39 - 117 U/L   AST 31 0 - 37 U/L   ALT 28 0 - 35 U/L   Total Protein 7.7 6.0 - 8.3 g/dL   Albumin 4.3 3.5 - 5.2 g/dL  Hemoglobin A1c     Status: Abnormal   Collection Time: 01/26/14  8:23 AM  Result Value Ref Range   Hgb A1c MFr Bld 7.1 (H) 4.6 - 6.5 %    Comment: Glycemic Control Guidelines for  People with Diabetes:Non Diabetic:  <6%Goal of Therapy: <7%Additional Action Suggested:  >8%   Vitamin D (25 hydroxy)     Status: None   Collection Time: 01/26/14  8:23 AM  Result Value Ref Range   VITD 89.77 30.00 - 100.00 ng/mL  Renal function panel     Status: Abnormal   Collection Time: 02/02/14  2:49 PM  Result Value Ref Range   Sodium 130 (L) 135 - 145 mEq/L   Potassium 4.9 3.5 - 5.1 mEq/L   Chloride 97 96 - 112 mEq/L   CO2 24 19 - 32 mEq/L   Calcium 9.5 8.4 - 10.5 mg/dL   Albumin 4.1 3.5 - 5.2 g/dL   BUN 15 6 - 23 mg/dL   Creatinine, Ser 0.7 0.4 - 1.2 mg/dL   Glucose, Bld 106 (H) 70 - 99 mg/dL   Phosphorus 3.4 2.3 - 4.6 mg/dL   GFR 88.68 >60.00 mL/min  Renal Function Panel     Status: Abnormal   Collection Time: 03/05/14  8:36 AM  Result Value Ref Range   Sodium 136 135 - 145 mEq/L   Potassium 4.4 3.5 - 5.1 mEq/L   Chloride 100 96 - 112 mEq/L   CO2 27 19 - 32 mEq/L   Calcium 9.8 8.4 - 10.5 mg/dL    Albumin 4.2 3.5 - 5.2 g/dL   BUN 18 6 - 23 mg/dL   Creatinine, Ser 0.71 0.40 - 1.20 mg/dL   Glucose, Bld 123 (H) 70 - 99 mg/dL   Phosphorus 3.5 2.3 - 4.6 mg/dL   GFR 85.78 >60.00 mL/min  POCT urinalysis dipstick     Status: Normal   Collection Time: 03/10/14 12:07 PM  Result Value Ref Range   Color, UA yellow    Clarity, UA clear    Glucose, UA neg    Bilirubin, UA neg    Ketones, UA neg    Spec Grav, UA 1.020    Blood, UA neg    pH, UA 6.0    Protein, UA neg    Urobilinogen, UA 0.2    Nitrite, UA neg    Leukocytes, UA Negative    Assessment/Plan: Low back pain MSK in nature. Topical Aspercreme.  Encouraged 2 500 mg Tylenol every 8 hours over the next few days.  Avoid heavy lifting or overexertion. Urine dip unremarkable.  Follow-up if symptoms do not continue to improve.

## 2014-03-11 ENCOUNTER — Telehealth: Payer: Self-pay | Admitting: Internal Medicine

## 2014-03-11 NOTE — Telephone Encounter (Signed)
I have left a message for the patient with Dr. Celesta Aver recommendations.  She is asked to try it for a few weeks to a month if it does not help then we can send in the name brand

## 2014-03-11 NOTE — Telephone Encounter (Signed)
I recommend she try it

## 2014-03-11 NOTE — Telephone Encounter (Signed)
Yours thoughts Sir?

## 2014-04-02 ENCOUNTER — Ambulatory Visit (INDEPENDENT_AMBULATORY_CARE_PROVIDER_SITE_OTHER): Payer: Medicare Other | Admitting: Internal Medicine

## 2014-04-02 ENCOUNTER — Encounter: Payer: Self-pay | Admitting: Internal Medicine

## 2014-04-02 VITALS — BP 110/60 | HR 68 | Ht 60.5 in | Wt 175.4 lb

## 2014-04-02 DIAGNOSIS — K3184 Gastroparesis: Secondary | ICD-10-CM | POA: Diagnosis not present

## 2014-04-02 DIAGNOSIS — R1013 Epigastric pain: Secondary | ICD-10-CM

## 2014-04-02 DIAGNOSIS — K589 Irritable bowel syndrome without diarrhea: Secondary | ICD-10-CM

## 2014-04-02 MED ORDER — RIFAXIMIN 550 MG PO TABS
550.0000 mg | ORAL_TABLET | Freq: Three times a day (TID) | ORAL | Status: DC
Start: 2014-04-02 — End: 2014-05-11

## 2014-04-02 NOTE — Assessment & Plan Note (Addendum)
Xifaxan - repeat dose

## 2014-04-02 NOTE — Patient Instructions (Addendum)
   Take Reglan (metaclopramide) before breakfast and supper No salads Follow the gastroparesis diet Take the Xifaxan, rx sent to pharmacy.  Will see you in about 6 weeks, appointment April 14th at 10:45Am   I appreciate the opportunity to care for you. Silvano Rusk, M.D., [facility]

## 2014-04-02 NOTE — Assessment & Plan Note (Signed)
Go back on metaclopramide Step 3 diet

## 2014-04-02 NOTE — Assessment & Plan Note (Signed)
Reglan ? Duloxetine

## 2014-04-04 ENCOUNTER — Encounter: Payer: Self-pay | Admitting: Internal Medicine

## 2014-04-04 NOTE — Progress Notes (Signed)
   Subjective:    [name], female    DOB: 27-Sep-1940, 74 y.o.   MRN: [id number] Cc: abdominal pain, bloating HPI The patient is c/o of increasing upper abdominal pain and bloating. Some nausea. Quite bothersome and often post-prandial. Similar to problems she has had x years. Not using metaclopramide daily because of concerns about side effects. She has been on generic Nexium x 1 week and thinks it may not suppress acid as well.   She was treated with Xifaxan in past and had 1 month of relief (diarrhea, bloating).  Post-prandial diarrhea is also back.   Medications, allergies, past medical history, past surgical history, family history and social history are reviewed and updated in the EMR. Review of Systems As above, + anxiety    Objective:   Physical Exam BP 110/60 mmHg  Pulse 68  Ht 5' 0.5" (1.537 m)  Wt 175 lb 6 oz (79.55 kg)  BMI 33.67 kg/m2  LMP 02/07/1992 Elderly NAD No signs of tardive dyskinesia    Assessment & Plan:  Gastroparesis Go back on metaclopramide Step 3 diet   EPIGASTRIC PAIN chronic Reglan ? Duloxetine    IBS (irritable bowel syndrome) Xifaxan - repeat dose     15 minutes time spent with patient > half in counseling coordination of care

## 2014-04-06 ENCOUNTER — Telehealth: Payer: Self-pay | Admitting: Internal Medicine

## 2014-04-06 NOTE — Telephone Encounter (Signed)
xifaxan is $1100 for rx.  She is advised that Dr. Carlean Purl may not answer until next week when he returns.  She states "I am ok, not having any pain".  Dr. Carlean Purl please advise an alternative

## 2014-04-07 NOTE — Telephone Encounter (Signed)
This was for diarrhea - let's watch her and no Rx and regroup at scheduled REV

## 2014-04-07 NOTE — Telephone Encounter (Signed)
I left a message for the patient with the response.  She is asked to call back for worsening symptoms or concerns prior to her office visit on 05/21/14

## 2014-04-15 DIAGNOSIS — H2513 Age-related nuclear cataract, bilateral: Secondary | ICD-10-CM | POA: Diagnosis not present

## 2014-04-30 DIAGNOSIS — H259 Unspecified age-related cataract: Secondary | ICD-10-CM | POA: Diagnosis not present

## 2014-05-01 ENCOUNTER — Other Ambulatory Visit: Payer: Self-pay | Admitting: Cardiology

## 2014-05-04 ENCOUNTER — Other Ambulatory Visit (INDEPENDENT_AMBULATORY_CARE_PROVIDER_SITE_OTHER): Payer: Medicare Other

## 2014-05-04 ENCOUNTER — Other Ambulatory Visit: Payer: Self-pay

## 2014-05-04 DIAGNOSIS — I10 Essential (primary) hypertension: Secondary | ICD-10-CM | POA: Diagnosis not present

## 2014-05-04 DIAGNOSIS — E780 Pure hypercholesterolemia: Secondary | ICD-10-CM | POA: Diagnosis not present

## 2014-05-04 DIAGNOSIS — G2581 Restless legs syndrome: Secondary | ICD-10-CM | POA: Diagnosis not present

## 2014-05-04 DIAGNOSIS — K219 Gastro-esophageal reflux disease without esophagitis: Secondary | ICD-10-CM | POA: Diagnosis not present

## 2014-05-04 DIAGNOSIS — Z79899 Other long term (current) drug therapy: Secondary | ICD-10-CM | POA: Diagnosis not present

## 2014-05-04 DIAGNOSIS — E871 Hypo-osmolality and hyponatremia: Secondary | ICD-10-CM

## 2014-05-04 DIAGNOSIS — E1169 Type 2 diabetes mellitus with other specified complication: Secondary | ICD-10-CM

## 2014-05-04 DIAGNOSIS — E669 Obesity, unspecified: Secondary | ICD-10-CM

## 2014-05-04 DIAGNOSIS — I471 Supraventricular tachycardia: Secondary | ICD-10-CM | POA: Diagnosis not present

## 2014-05-04 LAB — COMPLETE METABOLIC PANEL WITH GFR
ALK PHOS: 54 U/L (ref 39–117)
ALT: 20 U/L (ref 0–35)
AST: 22 U/L (ref 0–37)
Albumin: 4.2 g/dL (ref 3.5–5.2)
BUN: 16 mg/dL (ref 6–23)
CHLORIDE: 102 meq/L (ref 96–112)
CO2: 24 mEq/L (ref 19–32)
CREATININE: 0.71 mg/dL (ref 0.50–1.10)
Calcium: 9.3 mg/dL (ref 8.4–10.5)
GFR, EST NON AFRICAN AMERICAN: 85 mL/min
GFR, Est African American: >89 mL/min
Glucose, Bld: 96 mg/dL (ref 70–99)
POTASSIUM: 4 meq/L (ref 3.5–5.3)
Sodium: 138 mEq/L (ref 135–145)
Total Bilirubin: 0.6 mg/dL (ref 0.2–1.2)
Total Protein: 7 g/dL (ref 6.0–8.3)

## 2014-05-04 LAB — CBC
HCT: 32.2 % — ABNORMAL LOW (ref 36.0–46.0)
Hemoglobin: 10.6 g/dL — ABNORMAL LOW (ref 12.0–15.0)
MCHC: 32.8 g/dL (ref 30.0–36.0)
MCV: 75.9 fl — AB (ref 78.0–100.0)
Platelets: 248 10*3/uL (ref 150.0–400.0)
RBC: 4.24 Mil/uL (ref 3.87–5.11)
RDW: 16.1 % — AB (ref 11.5–15.5)
WBC: 7.1 10*3/uL (ref 4.0–10.5)

## 2014-05-04 LAB — LIPID PANEL
CHOL/HDL RATIO: 3
Cholesterol: 199 mg/dL (ref 0–200)
HDL: 69 mg/dL (ref >39.00)
LDL CALC: 110 mg/dL — AB (ref 0–99)
NonHDL: 130
Triglycerides: 100 mg/dL (ref 0.0–149.0)
VLDL: 20 mg/dL (ref 0.0–40.0)

## 2014-05-04 LAB — TSH: TSH: 1.87 u[IU]/mL (ref 0.35–4.50)

## 2014-05-04 LAB — HEMOGLOBIN A1C: HEMOGLOBIN A1C: 6.7 % — AB (ref 4.6–6.5)

## 2014-05-04 MED ORDER — METOCLOPRAMIDE HCL 5 MG PO TABS: 5.0000 mg | ORAL_TABLET | Freq: Two times a day (BID) | ORAL | Status: DC

## 2014-05-07 ENCOUNTER — Other Ambulatory Visit: Payer: Self-pay | Admitting: Family Medicine

## 2014-05-07 NOTE — Telephone Encounter (Signed)
This pt no longer seeing Dr. Loanne Drilling.

## 2014-05-07 NOTE — Telephone Encounter (Signed)
Are you ok with prescribing this medication for this patient.  Previously had been filled by Dr. Renato Shin and I sent to his LPN to fill, but she states she no longer see Dr. Loanne Drilling.  Advise on Refill.

## 2014-05-11 ENCOUNTER — Ambulatory Visit (INDEPENDENT_AMBULATORY_CARE_PROVIDER_SITE_OTHER): Payer: Medicare Other | Admitting: Family Medicine

## 2014-05-11 ENCOUNTER — Encounter: Payer: Self-pay | Admitting: Family Medicine

## 2014-05-11 VITALS — BP 136/74 | HR 67 | Temp 97.7°F | Resp 16 | Ht 60.0 in | Wt 174.0 lb

## 2014-05-11 DIAGNOSIS — E119 Type 2 diabetes mellitus without complications: Secondary | ICD-10-CM | POA: Diagnosis not present

## 2014-05-11 DIAGNOSIS — E782 Mixed hyperlipidemia: Secondary | ICD-10-CM

## 2014-05-11 DIAGNOSIS — D649 Anemia, unspecified: Secondary | ICD-10-CM

## 2014-05-11 DIAGNOSIS — E7800 Pure hypercholesterolemia, unspecified: Secondary | ICD-10-CM

## 2014-05-11 DIAGNOSIS — E669 Obesity, unspecified: Secondary | ICD-10-CM

## 2014-05-11 DIAGNOSIS — I10 Essential (primary) hypertension: Secondary | ICD-10-CM | POA: Diagnosis not present

## 2014-05-11 DIAGNOSIS — E1169 Type 2 diabetes mellitus with other specified complication: Secondary | ICD-10-CM

## 2014-05-11 DIAGNOSIS — G453 Amaurosis fugax: Secondary | ICD-10-CM

## 2014-05-11 DIAGNOSIS — K219 Gastro-esophageal reflux disease without esophagitis: Secondary | ICD-10-CM

## 2014-05-11 NOTE — Patient Instructions (Signed)
Drop the Crestor to 1/2 tab 3 x  A week multivitamin with iron    Cholesterol Cholesterol is a white, waxy, fat-like substance needed by your body in small amounts. The liver makes all the cholesterol you need. Cholesterol is carried from the liver by the blood through the blood vessels. Deposits of cholesterol (plaque) may build up on blood vessel walls. These make the arteries narrower and stiffer. Cholesterol plaques increase the risk for heart attack and stroke.  You cannot feel your cholesterol level even if it is very high. The only way to know it is high is with a blood test. Once you know your cholesterol levels, you should keep a record of the test results. Work with your health care provider to keep your levels in the desired range.  WHAT DO THE RESULTS MEAN?  Total cholesterol is a rough measure of all the cholesterol in your blood.   LDL is the so-called bad cholesterol. This is the type that deposits cholesterol in the walls of the arteries. You want this level to be low.   HDL is the good cholesterol because it cleans the arteries and carries the LDL away. You want this level to be high.  Triglycerides are fat that the body can either burn for energy or store. High levels are closely linked to heart disease.  WHAT ARE THE DESIRED LEVELS OF CHOLESTEROL?  Total cholesterol below 200.   LDL below 100 for people at risk, below 70 for those at very high risk.   HDL above 50 is good, above 60 is best.   Triglycerides below 150.  HOW CAN I LOWER MY CHOLESTEROL?  Diet. Follow your diet programs as directed by your health care provider.   Choose fish or white meat chicken and [place], roasted or baked. Limit fatty cuts of red meat, fried foods, and processed meats, such as sausage and lunch meats.   Eat lots of fresh fruits and vegetables.  Choose whole grains, beans, pasta, potatoes, and cereals.   Use only small amounts of olive, corn, or canola oils.   Avoid  butter, mayonnaise, shortening, or palm kernel oils.  Avoid foods with trans fats.   Drink skim or nonfat milk and eat low-fat or nonfat yogurt and cheeses. Avoid whole milk, cream, ice cream, egg yolks, and full-fat cheeses.   Healthy desserts include angel food cake, ginger snaps, animal crackers, hard candy, popsicles, and low-fat or nonfat frozen yogurt. Avoid pastries, cakes, pies, and cookies.   Exercise. Follow your exercise programs as directed by your health care provider.   A regular program helps decrease LDL and raise HDL.   A regular program helps with weight control.   Do things that increase your activity level like gardening, walking, or taking the stairs. Ask your health care provider about how you can be more active in your daily life.   Medicine. Take medicine only as directed by your health care provider.   Medicine may be prescribed by your health care provider to help lower cholesterol and decrease the risk for heart disease.   If you have several risk factors, you may need medicine even if your levels are normal. Document Released: 10/18/2000 Document Revised: 06/09/2013 Document Reviewed: 11/06/2012 [facility] Patient Information 2015 [place], [place]. This information is not intended to replace advice given to you by your health care provider. Make sure you discuss any questions you have with your health care provider.

## 2014-05-13 DIAGNOSIS — H2511 Age-related nuclear cataract, right eye: Secondary | ICD-10-CM | POA: Diagnosis not present

## 2014-05-13 DIAGNOSIS — H25811 Combined forms of age-related cataract, right eye: Secondary | ICD-10-CM | POA: Diagnosis not present

## 2014-05-17 ENCOUNTER — Encounter: Payer: Self-pay | Admitting: Family Medicine

## 2014-05-17 NOTE — Progress Notes (Signed)
[name]  [id number] 12/31/1940 05/17/2014      Progress Note-Follow Up  Subjective  Chief Complaint  Chief Complaint  Patient presents with  . Follow-up    Also fatigue    HPI  Patient is a 74 y.o. female in today for routine medical care. Patient is in today for follow-up. Frustrated with her persistent fatigue. No recent illness. Is following with gastroenterology and has recently had medication started for gastroparesis without significant improvement. Continues to have nausea but no real vomiting. Bowels are moving forward. No bloody or tarry stool. Is following closely with opthamology for her visual concerns. Denies CP/palp/SOB/HA/congestion/fevers/GI or GU c/o. Taking meds as prescribed  Past Medical History  Diagnosis Date  . Gastroparesis   . Pure hypercholesterolemia   . Headache(784.0)   . Unspecified essential hypertension   . Esophageal reflux   . Type II or unspecified type diabetes mellitus without mention of complication, not stated as uncontrolled   . Arthritis     Spinal Osteoarthritis  . Diverticulosis 08/30/2000    Colonoscopy   . Gastric polyp     Fundic Gland  . Anxiety   . Cataract   . Heart murmur     Echocardiogram 2/11: EF 60-65%, mild LAE, grade 1 diastolic dysfunction, aortic valve sclerosis, mean gradient 9 mm of mercury, PASP 34  . Chest pain     Myoview 1/11: EF 82%, no ischemia.  . Carcinoid tumor of stomach   . PSVT (paroxysmal supraventricular tachycardia)   . Leg swelling     bilateral  . Chronic kidney disease     Left kidney smaller than right kidney  . Cancer   . PONV (postoperative nausea and vomiting)     pt states only needs small amount of anesthesia  . Stroke     tia, 2014  . Iron deficiency anemia, unspecified   . Encounter for preventative adult health care exam with abnormal findings 09/14/2013  . Unspecified hereditary and idiopathic peripheral neuropathy 10/29/2013  . Diabetic peripheral neuropathy  10/29/2013  . Diabetes mellitus type 2 in obese 09/05/2006    Qualifier: Diagnosis of  By: Larose Kells      Past Surgical History  Procedure Laterality Date  . Cholecystectomy  1993  . Tonsillectomy    . Colonoscopy  11/11/2010    diverticulosis  . Esophagogastroduodenoscopy  08/29/2010; 09/15/2010    Carcinoid tumor less than 1 cm in July 2012 not seen in August 2012 , gastritis, fundic gland polyps  . Eus  12/15/2010    Procedure: UPPER ENDOSCOPIC ULTRASOUND (EUS) LINEAR;  Surgeon: Owens Loffler, MD;  Location: [facility];  Service: Endoscopy;  Laterality: N/A;  . Esophagogastroduodenoscopy  05/16/2011  . Esophagogastroduodenoscopy  06/14/2012  . Dilatation & currettage/hysteroscopy with resectocope N/A 02/25/2013    Procedure: Attempted hysteroscopy with uterine perforation;  Surgeon: Jamey Reas de Berton Lan, MD;  Location: [facility];  Service: Gynecology;  Laterality: N/A;  . Laparoscopy N/A 02/25/2013    Procedure: Cystoscopy and laparoscopy with fulguration of uterine serosa;  Surgeon: Jamey Reas de Berton Lan, MD;  Location: [facility];  Service: Gynecology;  Laterality: N/A;    Family History  Problem Relation Age of Onset  . Diabetes Mother   . Stroke Father     deceased age 73  . Heart disease Sister     deceased MI age 104  . Heart disease Brother     deceased MI age 64  . Colon  cancer Neg Hx   . Esophageal cancer Neg Hx   . Stomach cancer Neg Hx   . Rectal cancer Neg Hx   . Diabetes Maternal Grandmother   . Hypertension Paternal Grandmother   . Diabetes Sister   . Heart disease Sister   . Hypertension Sister   . Hyperlipidemia Sister   . Diabetes Sister   . Heart disease Sister   . Hypertension Sister   . Hyperlipidemia Sister   . Diabetes Brother   . Heart disease Brother   . Hypertension Brother   . Hyperlipidemia Brother     History   Social History  . Marital Status: Married    Spouse Name: N/A  . Number of Children: 0  . Years of  Education: college   Occupational History  . Retail    Social History Main Topics  . Smoking status: Never Smoker   . Smokeless tobacco: Never Used     Comment: Daily Caffeine Use -1  . Alcohol Use: No  . Drug Use: No  . Sexual Activity: No     Comment: lives alone, no dietary restrictions except avoid fresh veg, fruit, whole grains   Other Topics Concern  . Not on file   Social History Narrative   Patient is married ([name]) and lives with her husband.   Patient does not have any children.   Patient is right-handed.   Patient has a BA degree.   One caffeine drink daily     Current Outpatient Prescriptions on File Prior to Visit  Medication Sig Dispense Refill  . acetaminophen (TYLENOL) 500 MG tablet Take 2 tablets (1,000 mg total) by mouth every 6 (six) hours as needed for moderate pain. 30 tablet 0  . amLODipine (NORVASC) 5 MG tablet Take 1 tablet by mouth  daily 90 tablet 0  . aspirin 81 MG tablet Take 81 mg by mouth daily.    . calcium-vitamin D (OSCAL WITH D) 500-200 MG-UNIT per tablet Take 1 tablet by mouth.    . cholecalciferol (VITAMIN D) 1000 UNITS tablet Take 1,000 Units by mouth daily.      Marland Kitchen dicyclomine (BENTYL) 10 MG capsule Take 1 capsule (10 mg total) by mouth every 6 (six) hours as needed (abdominal pain- may take 2 capsules if 1 ineffective). 90 capsule 5  . esomeprazole (NEXIUM) 40 MG capsule Take 1 capsule (40 mg total) by mouth daily before breakfast. 90 capsule 3  . FOLBIC 2.5-25-2 MG TABS TAKE ONE TABLET BY MOUTH EVERY DAY 90 tablet 3  . metFORMIN (GLUCOPHAGE-XR) 500 MG 24 hr tablet Take 2 tablets (1,000 mg total) by mouth 2 (two) times daily. 180 tablet 1  . metoCLOPramide (REGLAN) 5 MG tablet Take 1 tablet (5 mg total) by mouth 2 (two) times daily. 180 tablet 1  . metoprolol (LOPRESSOR) 50 MG tablet Take 1 tablet (50 mg total) by mouth 2 (two) times   daily. 180 tablet 3  . Multiple Vitamin (MULTIVITAMIN) tablet Take 1 tablet by mouth daily.      .  ondansetron (ZOFRAN) 4 MG tablet Take 4 mg by mouth every 4 (four) hours as needed. For nausea      . potassium chloride (K-DUR,KLOR-CON) 10 MEQ tablet Take 1 tablet (10 mEq total) by mouth daily. 90 tablet 3  . rosuvastatin (CRESTOR) 10 MG tablet Take 1 tablet (10 mg total) by mouth daily. 28 tablet 0  . valsartan (DIOVAN) 320 MG tablet TAKE 1 TABLET (320 MG TOTAL) BY MOUTH DAILY. [place]  tablet 1   No current facility-administered medications on file prior to visit.    Allergies  Allergen Reactions  . Tramadol     Dizziness     Review of Systems  Review of Systems  Constitutional: Positive for malaise/fatigue. Negative for fever.  HENT: Negative for congestion.   Eyes: Negative for discharge.  Respiratory: Negative for shortness of breath.   Cardiovascular: Negative for chest pain, palpitations and leg swelling.  Gastrointestinal: Positive for heartburn, nausea and abdominal pain. Negative for diarrhea.  Genitourinary: Negative for dysuria.  Musculoskeletal: Negative for falls.  Skin: Negative for rash.  Neurological: Negative for loss of consciousness and headaches.  Endo/Heme/Allergies: Negative for polydipsia.  Psychiatric/Behavioral: Negative for depression and suicidal ideas. The patient is not nervous/anxious and does not have insomnia.     Objective  BP 136/74 mmHg  Pulse 67  Temp(Src) 97.7 F (36.5 C) (Oral)  Resp 16  Ht 5' (1.524 m)  Wt 174 lb (78.926 kg)  BMI 33.98 kg/m2  SpO2 98%  LMP 02/07/1992  Physical Exam  Physical Exam  Constitutional: She is oriented to person, place, and time and well-developed, well-nourished, and in no distress. No distress.  HENT:  Head: Normocephalic and atraumatic.  Eyes: Conjunctivae are normal.  Neck: Neck supple. No thyromegaly present.  Cardiovascular: Normal rate, regular rhythm and normal heart sounds.   No murmur heard. Pulmonary/Chest: Effort normal and breath sounds normal. She has no wheezes.  Abdominal: She  exhibits no distension and no mass.  Musculoskeletal: She exhibits no edema.  Lymphadenopathy:    She has no cervical adenopathy.  Neurological: She is alert and oriented to person, place, and time.  Skin: Skin is warm and dry. No rash noted. She is not diaphoretic.  Psychiatric: Memory, affect and judgment normal.    Lab Results  Component Value Date   TSH 1.87 05/04/2014   Lab Results  Component Value Date   WBC 7.1 05/04/2014   HGB 10.6* 05/04/2014   HCT 32.2* 05/04/2014   MCV 75.9* 05/04/2014   PLT 248.0 05/04/2014   Lab Results  Component Value Date   CREATININE 0.71 05/04/2014   BUN 16 05/04/2014   NA 138 05/04/2014   K 4.0 05/04/2014   CL 102 05/04/2014   CO2 24 05/04/2014   Lab Results  Component Value Date   ALT 20 05/04/2014   AST 22 05/04/2014   ALKPHOS 54 05/04/2014   BILITOT 0.6 05/04/2014   Lab Results  Component Value Date   CHOL 199 05/04/2014   Lab Results  Component Value Date   HDL 69.00 05/04/2014   Lab Results  Component Value Date   LDLCALC 110* 05/04/2014   Lab Results  Component Value Date   TRIG 100.0 05/04/2014   Lab Results  Component Value Date   CHOLHDL 3 05/04/2014     Assessment & Plan  Essential hypertension Well controlled, no changes to meds. Encouraged heart healthy diet such as the DASH diet and exercise as tolerated.    GERD (gastroesophageal reflux disease) Avoid offending foods, start probiotics. Do not eat large meals in late evening and consider raising head of bed. Gastroparesis continues to contribute. Encouraged small, frequent, bland meals and followu p with gastroenterology.   HYPERCHOLESTEROLEMIA Tolerating statin, encouraged heart healthy diet, avoid trans fats, minimize simple carbs and saturated fats. Increase exercise as tolerated   Diabetes mellitus type 2 in obese [id number] acceptable, minimize simple carbs. Increase exercise as tolerated. Continue current meds   Amaurosis fugax  of left  eye Is following with opthamology has appt later this month, doing better.

## 2014-05-17 NOTE — Assessment & Plan Note (Signed)
Is following with opthamology has appt later this month, doing better.

## 2014-05-17 NOTE — Assessment & Plan Note (Signed)
Tolerating statin, encouraged heart healthy diet, avoid trans fats, minimize simple carbs and saturated fats. Increase exercise as tolerated 

## 2014-05-17 NOTE — Assessment & Plan Note (Signed)
Well controlled, no changes to meds. Encouraged heart healthy diet such as the DASH diet and exercise as tolerated.  °

## 2014-05-17 NOTE — Assessment & Plan Note (Signed)
hgba1c acceptable, minimize simple carbs. Increase exercise as tolerated. Continue current meds 

## 2014-05-17 NOTE — Assessment & Plan Note (Signed)
Avoid offending foods, start probiotics. Do not eat large meals in late evening and consider raising head of bed. Gastroparesis continues to contribute. Encouraged small, frequent, bland meals and followu p with gastroenterology.

## 2014-05-18 ENCOUNTER — Other Ambulatory Visit: Payer: Self-pay | Admitting: Family Medicine

## 2014-05-18 MED ORDER — METFORMIN HCL ER 500 MG PO TB24: 1000.0000 mg | ORAL_TABLET | Freq: Two times a day (BID) | ORAL | Status: DC

## 2014-05-20 DIAGNOSIS — H2512 Age-related nuclear cataract, left eye: Secondary | ICD-10-CM | POA: Diagnosis not present

## 2014-05-21 ENCOUNTER — Other Ambulatory Visit: Payer: Self-pay | Admitting: Family Medicine

## 2014-05-21 ENCOUNTER — Encounter: Payer: Self-pay | Admitting: Internal Medicine

## 2014-05-21 ENCOUNTER — Ambulatory Visit (INDEPENDENT_AMBULATORY_CARE_PROVIDER_SITE_OTHER): Payer: Medicare Other | Admitting: Internal Medicine

## 2014-05-21 VITALS — BP 134/66 | HR 68 | Ht 60.5 in | Wt 173.0 lb

## 2014-05-21 DIAGNOSIS — D509 Iron deficiency anemia, unspecified: Secondary | ICD-10-CM | POA: Diagnosis not present

## 2014-05-21 DIAGNOSIS — D649 Anemia, unspecified: Secondary | ICD-10-CM

## 2014-05-21 DIAGNOSIS — R1013 Epigastric pain: Secondary | ICD-10-CM | POA: Diagnosis not present

## 2014-05-21 DIAGNOSIS — K3184 Gastroparesis: Secondary | ICD-10-CM | POA: Diagnosis not present

## 2014-05-21 DIAGNOSIS — K219 Gastro-esophageal reflux disease without esophagitis: Secondary | ICD-10-CM | POA: Diagnosis not present

## 2014-05-21 NOTE — Progress Notes (Signed)
   Subjective:    [name], female    DOB: [birth year], 74 y.o.   MRN: [id number] Cc: epigastric pain, gastroparesis HPI The patient reports she is doing very well without pain and only occasional heartburn. She has been following a step 3 gastroparesis diet and went back on metoclopramide 5 mg twice a day. She does think that generic Nexium is not as effective as the name brand but cannot afford the name brand so she just has occasional heartburn for which she has taken some over-the-counter omeprazole. She is anemic, her iron was low last fall, she denies any rectal bleeding. She is intolerant of oral iron she says. She says Dr. Charlett Blake think she might need iron shots. She has a fecal occult blood test she is supposed to do.  Review of Systems As above, she is not doing any vigorous exercising but says she is stretching    Objective:   Physical Exam BP 134/66 mmHg  Pulse 68  Ht 5' 0.5" (1.537 m)  Wt 173 lb (78.472 kg)  BMI 33.22 kg/m2  LMP 02/07/1992 Elderly middle [place] woman in no acute distress     Assessment & Plan:  Gastroparesis Symptoms are significantly improved on metoclopramide 5 mg twice a day and a step 3 gastroparesis diet. This is as good as I've seen her in years I think. She may try to add in some other foods that she has not eaten in small amounts to see if she can improve quality of life without causing gastrointestinal upset. Since she is on I diet rigorously now she may be able to reduce the amount of metoclopramide and she will try. I will see her back in 6 months routinely. I have not noticed any signs of tardive dyskinesia from her metoclopramide.   GERD (gastroesophageal reflux disease) She will use when necessary antacids since the generic Nexium doesn't work as well as name brand.   Iron deficiency anemia She is not having any overt GI bleeding, I agree with an iFOBT test. Last colonoscopy 2012 was negative and multiple EGDs have not  shown any reason for blood loss. Her ferritin was low last fall. She could need another colonoscopy if her Hemoccult test is positive. I agree with what sounds like has been suggested that she may need IV ferriheme or other iron injection, perhaps seeing hematology to do that. I tried to allay her fears about that. She has not been able to tolerate oral iron due to abdominal discomfort and she may not absorb well on chronic acid suppression.     EPIGASTRIC PAIN chronic Much better on gastroparesis diet. As good is she's been in a long time.    I appreciate the opportunity to care for this patient. CC: Penni Homans, MD

## 2014-05-21 NOTE — Assessment & Plan Note (Signed)
She will use when necessary antacids since the generic Nexium doesn't work as well as name brand.

## 2014-05-21 NOTE — Patient Instructions (Signed)
   1) You may try to introduce other foods in small amounts to see if you can tolerate them but in general stay on the special gastroparesis diet. 2) I think you will need the iron shots - I will communicate with Dr. Charlett Blake 3) Call in August to get an appointment to see me in October 4) You may see if you are ok with less metaclopramide 5) Antacids as needed are ok for heartburn  I appreciate the opportunity to care for you. Gatha Mayer, MD, [facility]

## 2014-05-21 NOTE — Assessment & Plan Note (Signed)
She is not having any overt GI bleeding, I agree with an iFOBT test. Last colonoscopy 2012 was negative and multiple EGDs have not shown any reason for blood loss. Her ferritin was low last fall. She could need another colonoscopy if her Hemoccult test is positive. I agree with what sounds like has been suggested that she may need IV ferriheme or other iron injection, perhaps seeing hematology to do that. I tried to allay her fears about that. She has not been able to tolerate oral iron due to abdominal discomfort and she may not absorb well on chronic acid suppression.

## 2014-05-21 NOTE — Assessment & Plan Note (Signed)
Much better on gastroparesis diet. As good is she's been in a long time.

## 2014-05-21 NOTE — Assessment & Plan Note (Signed)
Symptoms are significantly improved on metoclopramide 5 mg twice a day and a step 3 gastroparesis diet. This is as good as I've seen her in years I think. She may try to add in some other foods that she has not eaten in small amounts to see if she can improve quality of life without causing gastrointestinal upset. Since she is on I diet rigorously now she may be able to reduce the amount of metoclopramide and she will try. I will see her back in 6 months routinely. I have not noticed any signs of tardive dyskinesia from her metoclopramide.

## 2014-05-22 ENCOUNTER — Encounter: Payer: Self-pay | Admitting: Cardiology

## 2014-05-22 ENCOUNTER — Ambulatory Visit (INDEPENDENT_AMBULATORY_CARE_PROVIDER_SITE_OTHER): Payer: Medicare Other | Admitting: Cardiology

## 2014-05-22 VITALS — BP 126/66 | HR 67 | Ht 61.0 in | Wt 171.8 lb

## 2014-05-22 DIAGNOSIS — I471 Supraventricular tachycardia: Secondary | ICD-10-CM | POA: Diagnosis not present

## 2014-05-22 NOTE — Patient Instructions (Signed)
Your physician wants you to follow-up in: 1 Year. You will receive a reminder letter in the mail two months in advance. If you don't receive a letter, please call our office to schedule the follow-up appointment.  

## 2014-05-22 NOTE — Progress Notes (Signed)
HPI The patient presents for followup of SVT.   Since I last saw her she has had rare palpitations but none in the rectum problem brought her to the hospital.  She is not having any overt cardiovascular symptoms. The patient denies any new symptoms such as chest discomfort, neck or arm discomfort. There has been no new shortness of breath, PND or orthopnea. There have been no reported palpitations, presyncope or syncope.  She does still have sadness and isn't exercising as much probably because of this is related to the death of her husband last year.  Allergies  Allergen Reactions  . Tramadol     Dizziness     Current Outpatient Prescriptions  Medication Sig Dispense Refill  . acetaminophen (TYLENOL) 500 MG tablet Take 2 tablets (1,000 mg total) by mouth every 6 (six) hours as needed for moderate pain. 30 tablet 0  . amLODipine (NORVASC) 5 MG tablet Take 1 tablet by mouth  daily 90 tablet 0  . aspirin 81 MG tablet Take 81 mg by mouth daily.    . calcium-vitamin D (OSCAL WITH D) 500-200 MG-UNIT per tablet Take 1 tablet by mouth.    . cholecalciferol (VITAMIN D) 1000 UNITS tablet Take 1,000 Units by mouth daily.      Marland Kitchen dicyclomine (BENTYL) 10 MG capsule Take 1 capsule (10 mg total) by mouth every 6 (six) hours as needed (abdominal pain- may take 2 capsules if 1 ineffective). 90 capsule 5  . DUREZOL 0.05 % EMUL Place 1 drop into the right eye daily.    Marland Kitchen esomeprazole (NEXIUM) 40 MG capsule Take 1 capsule (40 mg total) by mouth daily before breakfast. 90 capsule 3  . FOLBIC 2.5-25-2 MG TABS TAKE ONE TABLET BY MOUTH EVERY DAY 90 tablet 3  . metFORMIN (GLUCOPHAGE-XR) 500 MG 24 hr tablet Take 2 tablets (1,000 mg total) by mouth 2 (two) times daily. 180 tablet 1  . metoCLOPramide (REGLAN) 5 MG tablet Take 1 tablet (5 mg total) by mouth 2 (two) times daily. 180 tablet 1  . metoprolol (LOPRESSOR) 50 MG tablet Take 1 tablet (50 mg total) by mouth 2 (two) times   daily. 180 tablet 3  . Multiple  Vitamin (MULTIVITAMIN) tablet Take 1 tablet by mouth daily.      . ondansetron (ZOFRAN) 4 MG tablet Take 4 mg by mouth every 4 (four) hours as needed. For nausea      . potassium chloride (K-DUR,KLOR-CON) 10 MEQ tablet Take 1 tablet (10 mEq total) by mouth daily. 90 tablet 3  . rosuvastatin (CRESTOR) 10 MG tablet Take 1 tablet (10 mg total) by mouth daily. 28 tablet 0  . valsartan (DIOVAN) 320 MG tablet TAKE 1 TABLET (320 MG TOTAL) BY MOUTH DAILY. 90 tablet 1   No current facility-administered medications for this visit.    Past Medical History  Diagnosis Date  . Gastroparesis   . Pure hypercholesterolemia   . Headache(784.0)   . Unspecified essential hypertension   . Esophageal reflux   . Type II or unspecified type diabetes mellitus without mention of complication, not stated as uncontrolled   . Arthritis     Spinal Osteoarthritis  . Diverticulosis 08/30/2000    Colonoscopy   . Gastric polyp     Fundic Gland  . Anxiety   . Cataract   . Heart murmur     Echocardiogram 2/11: EF 60-65%, mild LAE, grade 1 diastolic dysfunction, aortic valve sclerosis, mean gradient 9 mm of mercury, PASP 34  .  Chest pain     Myoview 1/11: EF 82%, no ischemia.  . Carcinoid tumor of stomach   . PSVT (paroxysmal supraventricular tachycardia)   . Leg swelling     bilateral  . Chronic kidney disease     Left kidney smaller than right kidney  . Cancer   . PONV (postoperative nausea and vomiting)     pt states only needs small amount of anesthesia  . Stroke     tia, 2014  . Iron deficiency anemia, unspecified   . Encounter for preventative adult health care exam with abnormal findings 09/14/2013  . Unspecified hereditary and idiopathic peripheral neuropathy 10/29/2013  . Diabetic peripheral neuropathy 10/29/2013  . Diabetes mellitus type 2 in obese 09/05/2006    Qualifier: Diagnosis of  By: Larose Kells      Past Surgical History  Procedure Laterality Date  . Cholecystectomy  1993  .  Tonsillectomy    . Colonoscopy  11/11/2010    diverticulosis  . Esophagogastroduodenoscopy  08/29/2010; 09/15/2010    Carcinoid tumor less than 1 cm in July 2012 not seen in August 2012 , gastritis, fundic gland polyps  . Eus  12/15/2010    Procedure: UPPER ENDOSCOPIC ULTRASOUND (EUS) LINEAR;  Surgeon: Owens Loffler, MD;  Location: [facility];  Service: Endoscopy;  Laterality: N/A;  . Esophagogastroduodenoscopy  05/16/2011  . Esophagogastroduodenoscopy  06/14/2012  . Dilatation & currettage/hysteroscopy with resectocope N/A 02/25/2013    Procedure: Attempted hysteroscopy with uterine perforation;  Surgeon: Jamey Reas de Berton Lan, MD;  Location: [facility];  Service: Gynecology;  Laterality: N/A;  . Laparoscopy N/A 02/25/2013    Procedure: Cystoscopy and laparoscopy with fulguration of uterine serosa;  Surgeon: Jamey Reas de Berton Lan, MD;  Location: [facility];  Service: Gynecology;  Laterality: N/A;    ROS:  As stated in the HPI and negative for all other systems.  PHYSICAL EXAM BP 126/66 mmHg  Pulse 67  Ht 5\' 1"  (1.549 m)  Wt 171 lb 12.8 oz (77.928 kg)  BMI 32.48 kg/m2  LMP 02/07/1992 GENERAL:  Well appearing NECK:  No jugular venous distention, waveform within normal limits, carotid upstroke brisk and symmetric, no bruits, no thyromegaly LUNGS:  Clear to auscultation bilaterally BACK:  No CVA tenderness CHEST:  Unremarkable HEART:  PMI not displaced or sustained,S1 and S2 within normal limits, no S3, no S4, no clicks, no rubs, slight systolic murmur peaking and heard best at the right upper sternal border, no diastolicmurmurs ABD:  Flat, positive bowel sounds normal in frequency in pitch, no bruits, no rebound, no guarding, no midline pulsatile mass, no hepatomegaly, no splenomegaly EXT:  2 plus pulses throughout, mild bilateral ankle edema, no cyanosis no clubbing PSYCH:  Tearful  EKG:  Sinus rhythm, rate 67, axis within normal limits, intervals within normal limits,  no acute ST-T wave changes. 05/22/2014   ASSESSMENT AND PLAN  Supraventricular Tachycardia - She's had no further sustained dysrhythmias. No change in therapy is indicated.  Elevated troponin - She had a negative Lexiscan Myoview at the end of last year and no symptoms. No further evaluation is warranted.    Hypertension - The blood pressure is at target. No change in medications is indicated. We will continue with therapeutic lifestyle changes (TLC).  Dyslipidemia -  She will remain on the meds as listed.  Her LDL was 110 and HDL 69 in March.  Carotid stenosis - She has some mild plaque and will be checked again in  September of 2016.  Murmur - This is mild aortic stenosis. We will follow this clinically.

## 2014-05-26 ENCOUNTER — Other Ambulatory Visit: Payer: Medicare Other

## 2014-05-27 ENCOUNTER — Other Ambulatory Visit: Payer: Medicare Other

## 2014-05-27 DIAGNOSIS — H25812 Combined forms of age-related cataract, left eye: Secondary | ICD-10-CM | POA: Diagnosis not present

## 2014-05-27 DIAGNOSIS — H2512 Age-related nuclear cataract, left eye: Secondary | ICD-10-CM | POA: Diagnosis not present

## 2014-05-27 LAB — FECAL OCCULT BLOOD, IMMUNOCHEMICAL: FECAL OCCULT BLD: NEGATIVE

## 2014-05-27 NOTE — Addendum Note (Signed)
Addended by: Modena Morrow D on: 05/27/2014 10:43 AM   Modules accepted: Orders

## 2014-06-01 ENCOUNTER — Ambulatory Visit: Payer: Medicare Other | Admitting: Cardiology

## 2014-06-05 ENCOUNTER — Telehealth: Payer: Self-pay | Admitting: Hematology & Oncology

## 2014-06-05 NOTE — Telephone Encounter (Addendum)
I spoke w NEW PATIENT today to remind them of their appointment with Dr. Marin Olp. Also, advised them to bring all medication bottles and insurance card information.  However, don't think she is bringing med bottles.

## 2014-06-08 ENCOUNTER — Ambulatory Visit (HOSPITAL_BASED_OUTPATIENT_CLINIC_OR_DEPARTMENT_OTHER): Payer: Medicare Other | Admitting: Hematology & Oncology

## 2014-06-08 ENCOUNTER — Ambulatory Visit: Payer: Medicare Other

## 2014-06-08 ENCOUNTER — Other Ambulatory Visit: Payer: Self-pay | Admitting: Family

## 2014-06-08 ENCOUNTER — Other Ambulatory Visit (HOSPITAL_BASED_OUTPATIENT_CLINIC_OR_DEPARTMENT_OTHER): Payer: Medicare Other

## 2014-06-08 ENCOUNTER — Encounter: Payer: Self-pay | Admitting: Hematology & Oncology

## 2014-06-08 VITALS — BP 135/61 | HR 67 | Temp 98.2°F | Resp 14 | Ht 67.0 in | Wt 174.0 lb

## 2014-06-08 DIAGNOSIS — D649 Anemia, unspecified: Secondary | ICD-10-CM

## 2014-06-08 DIAGNOSIS — D538 Other specified nutritional anemias: Secondary | ICD-10-CM

## 2014-06-08 DIAGNOSIS — K909 Intestinal malabsorption, unspecified: Secondary | ICD-10-CM

## 2014-06-08 HISTORY — DX: Anemia, unspecified: D64.9

## 2014-06-08 LAB — CBC WITH DIFFERENTIAL (CANCER CENTER ONLY)
BASO#: 0.1 10*3/uL (ref 0.0–0.2)
BASO%: 0.7 % (ref 0.0–2.0)
EOS%: 1.2 % (ref 0.0–7.0)
Eosinophils Absolute: 0.1 10*3/uL (ref 0.0–0.5)
HCT: 30.6 % — ABNORMAL LOW (ref 34.8–46.6)
HGB: 9.8 g/dL — ABNORMAL LOW (ref 11.6–15.9)
LYMPH#: 3.3 10*3/uL (ref 0.9–3.3)
LYMPH%: 40.4 % (ref 14.0–48.0)
MCH: 24.9 pg — AB (ref 26.0–34.0)
MCHC: 32 g/dL (ref 32.0–36.0)
MCV: 78 fL — AB (ref 81–101)
MONO#: 0.7 10*3/uL (ref 0.1–0.9)
MONO%: 8.3 % (ref 0.0–13.0)
NEUT%: 49.4 % (ref 39.6–80.0)
NEUTROS ABS: 4 10*3/uL (ref 1.5–6.5)
Platelets: 273 10*3/uL (ref 145–400)
RBC: 3.93 10*6/uL (ref 3.70–5.32)
RDW: 14.4 % (ref 11.1–15.7)
WBC: 8.2 10*3/uL (ref 3.9–10.0)

## 2014-06-08 LAB — CHCC SATELLITE - SMEAR

## 2014-06-08 NOTE — Progress Notes (Signed)
Referral MD  Reason for Referral: Normochromic and normocytic anemia   Chief Complaint  Patient presents with  . NEW PATIENT  : My blood count is low.  HPI: [name] is a very nice 74 year old [place] female. She was born in [place]. She is Nurse, learning disability. She has been in the [place] for 40 years.  She is followed by Dr. Charlett Blake. She has noticed that Ms. Breth has been anemic. She has done a fairly extensive workup.  She has noted that her anemia has been mild. She has had a slight microcytic index. She does not have any obvious renal insufficiency. Her protein has been okay.  She has been tested for GI bleeding. She has had no blood in the stool.  She apparently has been having issues with gastro-paresis. Her diet has been changed.  She said that she had a endoscopy a few years ago. She's been getting her mammograms routinely.  She has had no weight loss or weight gain. She does not smoke. She does not drink. There is no obvious occupational exposures.  About 7 months ago, she was found have a hemoglobin of 11.3 and hematocrit 34.6. Her MCV was 77.  In April, her white 6, 7.1. Hemoglobin 10.6. Hematocrit 32.2. Platelet count was not done. MCV was 76.  Again, there his been no bleeding. She is not a vegetarian  There is no history of anemia in the family.    Past Medical History  Diagnosis Date  . Gastroparesis   . Pure hypercholesterolemia   . Headache(784.0)   . Unspecified essential hypertension   . Esophageal reflux   . Type II or unspecified type diabetes mellitus without mention of complication, not stated as uncontrolled   . Arthritis     Spinal Osteoarthritis  . Diverticulosis 08/30/2000    Colonoscopy   . Gastric polyp     Fundic Gland  . Anxiety   . Cataract   . Heart murmur     Echocardiogram 2/11: EF 60-65%, mild LAE, grade 1 diastolic dysfunction, aortic valve sclerosis, mean gradient 9 mm of mercury, PASP 34  . Chest pain     Myoview 12/15 no  ischemia.  . Carcinoid tumor of stomach   . PSVT (paroxysmal supraventricular tachycardia)   . Leg swelling     bilateral  . Chronic kidney disease     Left kidney smaller than right kidney  . Cancer   . PONV (postoperative nausea and vomiting)     pt states only needs small amount of anesthesia  . Stroke     tia, 2014  . Iron deficiency anemia, unspecified   . Encounter for preventative adult health care exam with abnormal findings 09/14/2013  . Unspecified hereditary and idiopathic peripheral neuropathy 10/29/2013  . Diabetic peripheral neuropathy 10/29/2013  . Diabetes mellitus type 2 in obese 09/05/2006    Qualifier: Diagnosis of  By: Reatha Armour, Lucy    :  Past Surgical History  Procedure Laterality Date  . Cholecystectomy  1993  . Tonsillectomy    . Colonoscopy  11/11/2010    diverticulosis  . Esophagogastroduodenoscopy  08/29/2010; 09/15/2010    Carcinoid tumor less than 1 cm in July 2012 not seen in August 2012 , gastritis, fundic gland polyps  . Eus  12/15/2010    Procedure: UPPER ENDOSCOPIC ULTRASOUND (EUS) LINEAR;  Surgeon: Owens Loffler, MD;  Location: [facility];  Service: Endoscopy;  Laterality: N/A;  . Esophagogastroduodenoscopy  05/16/2011  . Esophagogastroduodenoscopy  06/14/2012  . Dilatation &  currettage/hysteroscopy with resectocope N/A 02/25/2013    Procedure: Attempted hysteroscopy with uterine perforation;  Surgeon: Jamey Reas de Berton Lan, MD;  Location: [facility];  Service: Gynecology;  Laterality: N/A;  . Laparoscopy N/A 02/25/2013    Procedure: Cystoscopy and laparoscopy with fulguration of uterine serosa;  Surgeon: Jamey Reas de Berton Lan, MD;  Location: [facility];  Service: Gynecology;  Laterality: N/A;  :   Current outpatient prescriptions:  .  acetaminophen (TYLENOL) 500 MG tablet, Take 2 tablets (1,000 mg total) by mouth every 6 (six) hours as needed for moderate pain., Disp: 30 tablet, Rfl: 0 .  amLODipine (NORVASC) 5 MG tablet, Take 1  tablet by mouth  daily, Disp: 90 tablet, Rfl: 0 .  aspirin 81 MG tablet, Take 81 mg by mouth daily., Disp: , Rfl:  .  calcium-vitamin D (OSCAL WITH D) 500-200 MG-UNIT per tablet, Take 1 tablet by mouth., Disp: , Rfl:  .  cholecalciferol (VITAMIN D) 1000 UNITS tablet, Take 1,000 Units by mouth daily.  , Disp: , Rfl:  .  dicyclomine (BENTYL) 10 MG capsule, Take 1 capsule (10 mg total) by mouth every 6 (six) hours as needed (abdominal pain- may take 2 capsules if 1 ineffective)., Disp: 90 capsule, Rfl: 5 .  DUREZOL 0.05 % EMUL, Place 1 drop into the right eye daily., Disp: , Rfl:  .  esomeprazole (NEXIUM) 40 MG capsule, Take 1 capsule (40 mg total) by mouth daily before breakfast., Disp: 90 capsule, Rfl: 3 .  FOLBIC 2.5-25-2 MG TABS, TAKE ONE TABLET BY MOUTH EVERY DAY, Disp: 90 tablet, Rfl: 3 .  metFORMIN (GLUCOPHAGE-XR) 500 MG 24 hr tablet, Take 2 tablets (1,000 mg total) by mouth 2 (two) times daily., Disp: 180 tablet, Rfl: 1 .  metoCLOPramide (REGLAN) 5 MG tablet, Take 1 tablet (5 mg total) by mouth 2 (two) times daily., Disp: 180 tablet, Rfl: 1 .  metoprolol (LOPRESSOR) 50 MG tablet, Take 1 tablet (50 mg total) by mouth 2 (two) times   daily., Disp: 180 tablet, Rfl: 3 .  Multiple Vitamin (MULTIVITAMIN) tablet, Take 1 tablet by mouth daily.  , Disp: , Rfl:  .  ondansetron (ZOFRAN) 4 MG tablet, Take 4 mg by mouth every 4 (four) hours as needed. For nausea  , Disp: , Rfl:  .  potassium chloride (K-DUR,KLOR-CON) 10 MEQ tablet, Take 1 tablet (10 mEq total) by mouth daily., Disp: 90 tablet, Rfl: 3 .  rosuvastatin (CRESTOR) 10 MG tablet, Take 1 tablet (10 mg total) by mouth daily., Disp: 28 tablet, Rfl: 0 .  valsartan (DIOVAN) 320 MG tablet, TAKE 1 TABLET (320 MG TOTAL) BY MOUTH DAILY., Disp: 90 tablet, Rfl: 1:  :  Allergies  Allergen Reactions  . Tramadol     Dizziness   :  Family History  Problem Relation Age of Onset  . Diabetes Mother   . Stroke Father     deceased age 39  . Heart  disease Sister     deceased MI age 36  . Heart disease Brother     deceased MI age 43  . Colon cancer Neg Hx   . Esophageal cancer Neg Hx   . Stomach cancer Neg Hx   . Rectal cancer Neg Hx   . Diabetes Maternal Grandmother   . Hypertension Paternal Grandmother   . Diabetes Sister   . Heart disease Sister   . Hypertension Sister   . Hyperlipidemia Sister   . Diabetes Sister   .  Heart disease Sister   . Hypertension Sister   . Hyperlipidemia Sister   . Diabetes Brother   . Heart disease Brother   . Hypertension Brother   . Hyperlipidemia Brother   :  History   Social History  . Marital Status: Married    Spouse Name: N/A  . Number of Children: 0  . Years of Education: college   Occupational History  . Retail    Social History Main Topics  . Smoking status: Never Smoker   . Smokeless tobacco: Never Used     Comment: Never used tobacco  . Alcohol Use: No  . Drug Use: No  . Sexual Activity: No     Comment: lives alone, no dietary restrictions except avoid fresh veg, fruit, whole grains   Other Topics Concern  . Not on file   Social History Narrative   Patient is married ([name]) and lives with her husband.   Patient does not have any children.   Patient is right-handed.   Patient has a BA degree.   One caffeine drink daily   :  Pertinent items are noted in HPI.  Exam: @IPVITALS @  well-developed and well-nourished female in no obvious distress. Vital signs are temperature of 98.2. Pulse 60. Blood pressure 135 or 61. Weight is 174 pounds. Head and neck exam shows no ocular or oral lesions. There are no palpable cervical or supraclavicular lymph nodes. Lungs are clear bilaterally. Cardiac exam regular rate and rhythm with no murmurs, rubs or bruits. Abdomen is soft. She has good bowel sounds. There is no fluid wave. There is no palpable liver or spleen tip. Back exam shows no kyphosis or osteoporotic changes. There is no tenderness over the spine, ribs or hips.  Extremities shows no clubbing, cyanosis or edema. Skin exam shows no rashes, ecchymoses or petechia. Neurological exam is nonfocal.  Recent Labs  06/08/14 1506  WBC 8.2  HGB 9.8*  HCT 30.6*  PLT 273   No results for input(s): NA, K, CL, CO2, GLUCOSE, BUN, CREATININE, CALCIUM in the last 72 hours.  Blood smear review:  slightly microcytic red blood cells. I see no target cells. There are no nucleated red blood cells. There are no schistocytes or spherocytes. I see no rouleau formation. She has no inclusion bodies. White cells are normal in morphology maturation. There is no immature myeloid or lymphoid forms. She has no atypical lymphocytes. She has no hypersegmented polys. Platelets appeared adequate in number and size.  PaNone    Assessment and Plan:  74 year old female with mild anemia.  I would have to suspect that she is either iron deficient or erythropoietin deficient area did even though she does not have renal insufficiency, I would have to believe that she has a low erythropoietin level. She did tell me that one of her kidneys is small in size and does not work as well.  I do not suspect any hematologic malignancy. I don't suspect myelodysplasia although at 74 years old, this is a possibility.  I think a bone marrow test would be a last resort option.  I don't think we need any x-rays or scans.  I spent about an hour with her. I will get back with her and try to take care of a lot of this over the phone.  Answered all of her questions. I tried to reassure her.

## 2014-06-09 LAB — FERRITIN CHCC: Ferritin: 5 ng/ml — ABNORMAL LOW (ref 9–269)

## 2014-06-09 LAB — IRON AND TIBC CHCC
%SAT: 4 % — AB (ref 21–57)
IRON: 17 ug/dL — AB (ref 41–142)
TIBC: 493 ug/dL — ABNORMAL HIGH (ref 236–444)
UIBC: 475 ug/dL — AB (ref 120–384)

## 2014-06-10 ENCOUNTER — Other Ambulatory Visit: Payer: Medicare Other

## 2014-06-10 ENCOUNTER — Encounter: Payer: Self-pay | Admitting: Hematology & Oncology

## 2014-06-10 DIAGNOSIS — K909 Intestinal malabsorption, unspecified: Secondary | ICD-10-CM

## 2014-06-10 HISTORY — DX: Intestinal malabsorption, unspecified: K90.9

## 2014-06-10 LAB — HEMOGLOBINOPATHY EVALUATION
HEMOGLOBIN OTHER: 0 %
HGB A: 97.9 % — AB (ref 96.8–97.8)
HGB F QUANT: 0 % (ref 0.0–2.0)
HGB S QUANTITAION: 0 %
Hgb A2 Quant: 2.1 % — ABNORMAL LOW (ref 2.2–3.2)

## 2014-06-10 LAB — ERYTHROPOIETIN: Erythropoietin: 37.9 m[IU]/mL — ABNORMAL HIGH (ref 2.6–18.5)

## 2014-06-10 NOTE — Addendum Note (Signed)
Addended by: Burney Gauze R on: 06/10/2014 05:32 PM   Modules accepted: Orders

## 2014-06-17 ENCOUNTER — Ambulatory Visit (HOSPITAL_BASED_OUTPATIENT_CLINIC_OR_DEPARTMENT_OTHER): Payer: Medicare Other

## 2014-06-17 VITALS — BP 138/57 | HR 60 | Temp 98.1°F | Resp 18 | Wt 176.0 lb

## 2014-06-17 DIAGNOSIS — K909 Intestinal malabsorption, unspecified: Secondary | ICD-10-CM

## 2014-06-17 DIAGNOSIS — D649 Anemia, unspecified: Secondary | ICD-10-CM | POA: Diagnosis present

## 2014-06-17 DIAGNOSIS — D538 Other specified nutritional anemias: Secondary | ICD-10-CM

## 2014-06-17 MED ORDER — SODIUM CHLORIDE 0.9 % IV SOLN
INTRAVENOUS | Status: DC
  Administered 2014-06-17: 15:00:00 via INTRAVENOUS

## 2014-06-17 MED ORDER — FERUMOXYTOL INJECTION 510 MG/17 ML
510.0000 mg | Freq: Once | INTRAVENOUS | Status: AC
  Administered 2014-06-17: 510 mg via INTRAVENOUS
  Filled 2014-06-17: qty 17

## 2014-06-17 NOTE — Patient Instructions (Signed)

## 2014-06-25 ENCOUNTER — Ambulatory Visit (HOSPITAL_BASED_OUTPATIENT_CLINIC_OR_DEPARTMENT_OTHER): Payer: Medicare Other

## 2014-06-25 VITALS — BP 140/57 | HR 65 | Temp 98.2°F | Resp 18

## 2014-06-25 DIAGNOSIS — D509 Iron deficiency anemia, unspecified: Secondary | ICD-10-CM

## 2014-06-25 DIAGNOSIS — K909 Intestinal malabsorption, unspecified: Secondary | ICD-10-CM

## 2014-06-25 MED ORDER — SODIUM CHLORIDE 0.9 % IV SOLN
Freq: Once | INTRAVENOUS | Status: AC
  Administered 2014-06-25: 14:00:00 via INTRAVENOUS

## 2014-06-25 MED ORDER — SODIUM CHLORIDE 0.9 % IV SOLN
510.0000 mg | Freq: Once | INTRAVENOUS | Status: AC
  Administered 2014-06-25: 510 mg via INTRAVENOUS
  Filled 2014-06-25: qty 17

## 2014-06-25 NOTE — Patient Instructions (Signed)

## 2014-07-19 ENCOUNTER — Other Ambulatory Visit: Payer: Self-pay | Admitting: Cardiology

## 2014-07-23 ENCOUNTER — Telehealth: Payer: Self-pay | Admitting: *Deleted

## 2014-07-23 ENCOUNTER — Encounter: Payer: Self-pay | Admitting: Hematology & Oncology

## 2014-07-23 ENCOUNTER — Ambulatory Visit (HOSPITAL_BASED_OUTPATIENT_CLINIC_OR_DEPARTMENT_OTHER): Payer: Medicare Other | Admitting: Hematology & Oncology

## 2014-07-23 ENCOUNTER — Other Ambulatory Visit (HOSPITAL_BASED_OUTPATIENT_CLINIC_OR_DEPARTMENT_OTHER): Payer: Medicare Other

## 2014-07-23 VITALS — BP 130/53 | HR 64 | Temp 97.7°F | Resp 14 | Ht 67.0 in | Wt 174.0 lb

## 2014-07-23 DIAGNOSIS — D509 Iron deficiency anemia, unspecified: Secondary | ICD-10-CM | POA: Diagnosis not present

## 2014-07-23 DIAGNOSIS — D538 Other specified nutritional anemias: Secondary | ICD-10-CM

## 2014-07-23 DIAGNOSIS — K909 Intestinal malabsorption, unspecified: Secondary | ICD-10-CM

## 2014-07-23 LAB — CBC WITH DIFFERENTIAL (CANCER CENTER ONLY)
BASO#: 0 10*3/uL (ref 0.0–0.2)
BASO%: 0.4 % (ref 0.0–2.0)
EOS ABS: 0.1 10*3/uL (ref 0.0–0.5)
EOS%: 1.1 % (ref 0.0–7.0)
HEMATOCRIT: 36.6 % (ref 34.8–46.6)
HGB: 12.3 g/dL (ref 11.6–15.9)
LYMPH#: 2.3 10*3/uL (ref 0.9–3.3)
LYMPH%: 30.2 % (ref 14.0–48.0)
MCH: 27.6 pg (ref 26.0–34.0)
MCHC: 33.6 g/dL (ref 32.0–36.0)
MCV: 82 fL (ref 81–101)
MONO#: 0.7 10*3/uL (ref 0.1–0.9)
MONO%: 9.5 % (ref 0.0–13.0)
NEUT#: 4.5 10*3/uL (ref 1.5–6.5)
NEUT%: 58.8 % (ref 39.6–80.0)
Platelets: 235 10*3/uL (ref 145–400)
RBC: 4.45 10*6/uL (ref 3.70–5.32)
RDW: 19.2 % — AB (ref 11.1–15.7)
WBC: 7.6 10*3/uL (ref 3.9–10.0)

## 2014-07-23 LAB — RETICULOCYTES (CHCC)
ABS RETIC: 63 10*3/uL (ref 19.0–186.0)
RBC.: 4.5 MIL/uL (ref 3.87–5.11)
Retic Ct Pct: 1.4 % (ref 0.4–2.3)

## 2014-07-23 LAB — IRON AND TIBC CHCC
%SAT: 27 % (ref 21–57)
IRON: 86 ug/dL (ref 41–142)
TIBC: 318 ug/dL (ref 236–444)
UIBC: 231 ug/dL (ref 120–384)

## 2014-07-23 LAB — CHCC SATELLITE - SMEAR

## 2014-07-23 LAB — FERRITIN CHCC: FERRITIN: 250 ng/mL (ref 9–269)

## 2014-07-23 NOTE — Progress Notes (Signed)
Hematology and Oncology Follow Up Visit  [name] [id number] 1940/08/08 74 y.o. 07/23/2014   Principle Diagnosis:   Iron deficiency anemia secondary to malabsorption  Current Therapy:    IV iron-patient received 2 doses back in May 2016     Interim History:  [name] is back for follow-up. We first saw her back in early May. At that point time, she was clearly iron deficient. Her ferritin was only 5 and her iron saturation was 4%. We gave her 2 doses of Feraheme. This worked incredibly well. She feels a whole lot better. She has more energy. She does not have muscle cramps.  More first saw her, we did a hemoglobin electrophoresis on her. She has a normal hemoglobin pattern.  We also did an erythropoietin level on her. This showed an erythropoietin level of 38.  She's not noticed any bleeding.  Her appetite has been good. Past she's not noted any problems with fever, sweats or chills.  Overall, her performance status is ECOG 1.  Medications:  Current outpatient prescriptions:  .  acetaminophen (TYLENOL) 500 MG tablet, Take 2 tablets (1,000 mg total) by mouth every 6 (six) hours as needed for moderate pain., Disp: 30 tablet, Rfl: 0 .  amLODipine (NORVASC) 5 MG tablet, Take 1 tablet by mouth  daily, Disp: 90 tablet, Rfl: 0 .  aspirin 81 MG tablet, Take 81 mg by mouth daily., Disp: , Rfl:  .  calcium-vitamin D (OSCAL WITH D) 500-200 MG-UNIT per tablet, Take 1 tablet by mouth., Disp: , Rfl:  .  cholecalciferol (VITAMIN D) 1000 UNITS tablet, Take 1,000 Units by mouth daily.  , Disp: , Rfl:  .  dicyclomine (BENTYL) 10 MG capsule, Take 1 capsule (10 mg total) by mouth every 6 (six) hours as needed (abdominal pain- may take 2 capsules if 1 ineffective)., Disp: 90 capsule, Rfl: 5 .  FOLBIC 2.5-25-2 MG TABS, TAKE ONE TABLET BY MOUTH EVERY DAY, Disp: 90 tablet, Rfl: 3 .  metFORMIN (GLUCOPHAGE-XR) 500 MG 24 hr tablet, Take 2 tablets (1,000 mg total) by mouth 2 (two) times  daily., Disp: 180 tablet, Rfl: 1 .  metoCLOPramide (REGLAN) 5 MG tablet, Take 1 tablet (5 mg total) by mouth 2 (two) times daily., Disp: 180 tablet, Rfl: 1 .  metoprolol (LOPRESSOR) 50 MG tablet, Take 1 tablet (50 mg total) by mouth 2 (two) times   daily., Disp: 180 tablet, Rfl: 3 .  Multiple Vitamin (MULTIVITAMIN) tablet, Take 1 tablet by mouth daily.  , Disp: , Rfl:  .  ondansetron (ZOFRAN) 4 MG tablet, Take 4 mg by mouth every 4 (four) hours as needed. For nausea  , Disp: , Rfl:  .  potassium chloride (K-DUR,KLOR-CON) 10 MEQ tablet, Take 1 tablet (10 mEq total) by mouth daily., Disp: 90 tablet, Rfl: 3 .  rosuvastatin (CRESTOR) 10 MG tablet, Take 1 tablet (10 mg total) by mouth daily., Disp: 28 tablet, Rfl: 0 .  valsartan (DIOVAN) 320 MG tablet, TAKE 1 TABLET (320 MG TOTAL) BY MOUTH DAILY., Disp: 90 tablet, Rfl: 1 .  esomeprazole (NEXIUM) 40 MG capsule, Take 1 capsule (40 mg total) by mouth daily before breakfast., Disp: 90 capsule, Rfl: 3  Allergies:  Allergies  Allergen Reactions  . Tramadol     Dizziness     Past Medical History, Surgical history, Social history, and Family History were reviewed and updated.  Review of Systems: As above  Physical Exam:  height is 5\' 7"  (1.702 m) and weight is 174  lb (78.926 kg). Her oral temperature is 97.7 F (36.5 C). Her blood pressure is 130/53 and her pulse is 64. Her respiration is 14.   Wt Readings from Last 3 Encounters:  07/23/14 174 lb (78.926 kg)  06/17/14 176 lb (79.833 kg)  06/08/14 174 lb (78.926 kg)     Well-developed well-nourished white female in no obvious distress. Head and neck exam shows no ocular or oral lesions. There are no palpable cervical or supraclavicular lymph nodes. Lungs are clear. Cardiac exam regular rate and rhythm with no murmurs, rubs or bruits. Abdomen is soft. She has good bowel sounds. There is no fluid wave. There is no palpable liver or spleen tip. Back exam shows no tenderness over the spine, ribs or  hips. Extremities shows no clubbing, cyanosis or edema. Skin exam shows no rashes, ecchymosis or petechia. Neurological exam shows no focal neurological deficits.  Lab Results  Component Value Date   WBC 7.6 07/23/2014   HGB 12.3 07/23/2014   HCT 36.6 07/23/2014   MCV 82 07/23/2014   PLT 235 07/23/2014     Chemistry      Component Value Date/Time   NA 138 05/04/2014 0829   K 4.0 05/04/2014 0829   CL 102 05/04/2014 0829   CO2 24 05/04/2014 0829   BUN 16 05/04/2014 0829   CREATININE 0.71 05/04/2014 0829   CREATININE 0.71 03/05/2014 0836      Component Value Date/Time   CALCIUM 9.3 05/04/2014 0829   ALKPHOS 54 05/04/2014 0829   AST 22 05/04/2014 0829   ALT 20 05/04/2014 0829   BILITOT 0.6 05/04/2014 0829         Impression and Plan: [name] is 74 year old white female. She has iron deficiency. I suspect that this is malabsorption. She's had stools tested which were negative for blood.  I think at this point, we can get her back in 3 months. It is possible that she may need iron in the future. She tolerated the IV iron well so if we had to, we can certainly do another dose for her.   Volanda Napoleon, MD 6/16/201611:37 AM

## 2014-07-23 NOTE — Telephone Encounter (Addendum)
Message left on home phone answering machine  ----- Message from Volanda Napoleon, MD sent at 07/23/2014  2:03 PM EDT ----- Please call and tell her that the iron level is much better. She does not need any extra iron right now. thanks

## 2014-08-03 ENCOUNTER — Other Ambulatory Visit: Payer: Self-pay

## 2014-08-11 ENCOUNTER — Other Ambulatory Visit (INDEPENDENT_AMBULATORY_CARE_PROVIDER_SITE_OTHER): Payer: Medicare Other

## 2014-08-11 DIAGNOSIS — I10 Essential (primary) hypertension: Secondary | ICD-10-CM

## 2014-08-11 DIAGNOSIS — E669 Obesity, unspecified: Secondary | ICD-10-CM

## 2014-08-11 DIAGNOSIS — E1169 Type 2 diabetes mellitus with other specified complication: Secondary | ICD-10-CM

## 2014-08-11 DIAGNOSIS — E782 Mixed hyperlipidemia: Secondary | ICD-10-CM

## 2014-08-11 DIAGNOSIS — D649 Anemia, unspecified: Secondary | ICD-10-CM

## 2014-08-11 DIAGNOSIS — E119 Type 2 diabetes mellitus without complications: Secondary | ICD-10-CM | POA: Diagnosis not present

## 2014-08-11 LAB — COMPREHENSIVE METABOLIC PANEL
ALBUMIN: 4 g/dL (ref 3.5–5.2)
ALK PHOS: 52 U/L (ref 39–117)
ALT: 28 U/L (ref 0–35)
AST: 22 U/L (ref 0–37)
BUN: 12 mg/dL (ref 6–23)
CALCIUM: 9.5 mg/dL (ref 8.4–10.5)
CHLORIDE: 100 meq/L (ref 96–112)
CO2: 25 mEq/L (ref 19–32)
Creatinine, Ser: 0.63 mg/dL (ref 0.40–1.20)
GFR: 98.35 mL/min (ref >60.00)
GLUCOSE: 105 mg/dL — AB (ref 70–99)
Potassium: 4 mEq/L (ref 3.5–5.1)
Sodium: 135 mEq/L (ref 135–145)
Total Bilirubin: 0.6 mg/dL (ref 0.2–1.2)
Total Protein: 7.1 g/dL (ref 6.0–8.3)

## 2014-08-11 LAB — CBC
HCT: 38.2 % (ref 36.0–46.0)
HEMOGLOBIN: 12.6 g/dL (ref 12.0–15.0)
MCHC: 33 g/dL (ref 30.0–36.0)
MCV: 83.7 fl (ref 78.0–100.0)
PLATELETS: 200 10*3/uL (ref 150.0–400.0)
RBC: 4.57 Mil/uL (ref 3.87–5.11)
RDW: 20.8 % — ABNORMAL HIGH (ref 11.5–15.5)
WBC: 6.4 10*3/uL (ref 4.0–10.5)

## 2014-08-11 LAB — LIPID PANEL
Cholesterol: 191 mg/dL (ref 0–200)
HDL: 59.5 mg/dL (ref >39.00)
LDL CALC: 109 mg/dL — AB (ref 0–99)
NONHDL: 131.5
Total CHOL/HDL Ratio: 3
Triglycerides: 115 mg/dL (ref 0.0–149.0)
VLDL: 23 mg/dL (ref 0.0–40.0)

## 2014-08-11 LAB — TSH: TSH: 1.54 u[IU]/mL (ref 0.35–4.50)

## 2014-08-11 LAB — HEMOGLOBIN A1C: HEMOGLOBIN A1C: 5.8 % (ref 4.6–6.5)

## 2014-08-11 NOTE — Addendum Note (Signed)
Addended by: Peggyann Shoals on: 08/11/2014 07:59 AM   Modules accepted: Orders

## 2014-08-14 ENCOUNTER — Encounter: Payer: Self-pay | Admitting: Family Medicine

## 2014-08-14 ENCOUNTER — Ambulatory Visit (INDEPENDENT_AMBULATORY_CARE_PROVIDER_SITE_OTHER): Payer: Medicare Other | Admitting: Family Medicine

## 2014-08-14 VITALS — BP 120/70 | HR 63 | Temp 98.1°F | Ht 61.0 in | Wt 175.2 lb

## 2014-08-14 DIAGNOSIS — E7800 Pure hypercholesterolemia, unspecified: Secondary | ICD-10-CM

## 2014-08-14 DIAGNOSIS — I471 Supraventricular tachycardia: Secondary | ICD-10-CM

## 2014-08-14 DIAGNOSIS — K219 Gastro-esophageal reflux disease without esophagitis: Secondary | ICD-10-CM | POA: Diagnosis not present

## 2014-08-14 DIAGNOSIS — E119 Type 2 diabetes mellitus without complications: Secondary | ICD-10-CM

## 2014-08-14 DIAGNOSIS — M26609 Unspecified temporomandibular joint disorder, unspecified side: Secondary | ICD-10-CM

## 2014-08-14 DIAGNOSIS — M2660 Temporomandibular joint disorder, unspecified: Secondary | ICD-10-CM

## 2014-08-14 DIAGNOSIS — G459 Transient cerebral ischemic attack, unspecified: Secondary | ICD-10-CM

## 2014-08-14 DIAGNOSIS — E1169 Type 2 diabetes mellitus with other specified complication: Secondary | ICD-10-CM

## 2014-08-14 DIAGNOSIS — E559 Vitamin D deficiency, unspecified: Secondary | ICD-10-CM

## 2014-08-14 DIAGNOSIS — R202 Paresthesia of skin: Secondary | ICD-10-CM | POA: Diagnosis not present

## 2014-08-14 DIAGNOSIS — R200 Anesthesia of skin: Secondary | ICD-10-CM

## 2014-08-14 DIAGNOSIS — I10 Essential (primary) hypertension: Secondary | ICD-10-CM | POA: Diagnosis not present

## 2014-08-14 DIAGNOSIS — E669 Obesity, unspecified: Secondary | ICD-10-CM

## 2014-08-14 DIAGNOSIS — K3184 Gastroparesis: Secondary | ICD-10-CM

## 2014-08-14 LAB — SEDIMENTATION RATE: Sed Rate: 18 mm/hr (ref 0–22)

## 2014-08-14 MED ORDER — ROSUVASTATIN CALCIUM 10 MG PO TABS: 5.0000 mg | ORAL_TABLET | Freq: Every day | ORAL | Status: DC

## 2014-08-14 NOTE — Assessment & Plan Note (Signed)
Will check level with next blood draw 

## 2014-08-14 NOTE — Assessment & Plan Note (Addendum)
Unclear history, notes her first episode occurred in her 55s with some weakness/numbness in left hand/arm and left eye blindness. This resolved spontaneously since then she has had a couple of recurrences but they had also resolved although with further discussion she notes numbness and weakness intermittently in left arm and leg. She notes this has worsened recently and she is noting worsening burning, pressure in left forehead and left cheek. Xray showed significant cervical spinal stenosis but she declines MRI today. CT of head negative for intracranial pathology in October of 2015. Suspect symptoms due to Trigeminal Neuralgia and spinal stenosis but will refer to neurology for further consideration at this time.

## 2014-08-14 NOTE — Patient Instructions (Signed)

## 2014-08-14 NOTE — Progress Notes (Signed)
[name]  [id number] Jan 08, 1941 08/14/2014      Progress Note-Follow Up  Subjective  Chief Complaint  Chief Complaint  Patient presents with  . Follow-up    HPI  Patient is a 74 y.o. female in today for routine medical care. Patient is in today for follow-up and offers persistent complaints. She notes symptoms are similar to today stating back to age 44. She speaks for a long time about intermittent left-sided weakness and left-sided facial pain and neck pain. There is no pain that is different. She does note some numbness in her ear hurts. 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 She reports at times the pain is bad enough to keep her from sleeping. Back when she was 51 she did have the first episode and it did include some transient blindness but that has not recurred. She's also complaining of some neck pain. Denies any traumas or falls. No polyuria or polydipsia. Denies CP/palp/SOB/HA/congestion/fevers/GI or GU c/o. Taking meds as prescribed  Past Medical History  Diagnosis Date  . Gastroparesis   . Pure hypercholesterolemia   . Headache(784.0)   . Unspecified essential hypertension   . Esophageal reflux   . Type II or unspecified type diabetes mellitus without mention of complication, not stated as uncontrolled   . Arthritis     Spinal Osteoarthritis  . Diverticulosis 08/30/2000    Colonoscopy   . Gastric polyp     Fundic Gland  . Anxiety   . Cataract   . Heart murmur     Echocardiogram 2/11: EF 60-65%, mild LAE, grade 1 diastolic dysfunction, aortic valve sclerosis, mean gradient 9 mm of mercury, PASP 34  . Chest pain     Myoview 12/15 no ischemia.  . Carcinoid tumor of stomach   . PSVT (paroxysmal supraventricular tachycardia)   . Leg swelling     bilateral  . Chronic kidney disease     Left kidney smaller than right kidney  . Cancer   . PONV (postoperative nausea and vomiting)     pt states only needs small amount of anesthesia  . Stroke     tia, 2014  . Iron  deficiency anemia, unspecified   . Encounter for preventative adult health care exam with abnormal findings 09/14/2013  . Unspecified hereditary and idiopathic peripheral neuropathy 10/29/2013  . Diabetic peripheral neuropathy 10/29/2013  . Diabetes mellitus type 2 in obese 09/05/2006    Qualifier: Diagnosis of  By: Marca Ancona RMA, Lucy    . Anemia 06/08/2014  . 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 Cholecystectomy  1993  . Tonsillectomy    . Colonoscopy  11/11/2010    diverticulosis  . Esophagogastroduodenoscopy  08/29/2010; 09/15/2010    Carcinoid tumor less than 1 cm in July 2012 not seen in August 2012 , gastritis, fundic gland polyps  . Eus  12/15/2010    Procedure: UPPER ENDOSCOPIC ULTRASOUND (EUS) LINEAR;  Surgeon: Owens Loffler, MD;  Location: [facility];  Service: Endoscopy;  Laterality: N/A;  . Esophagogastroduodenoscopy  05/16/2011  . Esophagogastroduodenoscopy  06/14/2012  . Dilatation & currettage/hysteroscopy with resectocope N/A 02/25/2013    Procedure: Attempted hysteroscopy with uterine perforation;  Surgeon: Jamey Reas de Berton Lan, MD;  Location: [facility];  Service: Gynecology;  Laterality: N/A;  . Laparoscopy N/A 02/25/2013    Procedure: Cystoscopy and laparoscopy with fulguration of uterine serosa;  Surgeon: Jamey Reas de Berton Lan, MD;  Location: [facility];  Service: Gynecology;  Laterality: N/A;    Family History  Problem Relation Age of Onset  . Diabetes Mother   . Stroke Father     deceased age 70  . Heart disease Sister     deceased MI age 70  . Heart disease Brother     deceased MI age 73  . Colon cancer Neg Hx   . Esophageal cancer Neg Hx   . Stomach cancer Neg Hx   . Rectal cancer Neg Hx   . Diabetes Maternal Grandmother   . Hypertension Paternal Grandmother   . Diabetes Sister   . Heart disease Sister   . Hypertension Sister   . Hyperlipidemia Sister   . Diabetes Sister   . Heart disease Sister    . Hypertension Sister   . Hyperlipidemia Sister   . Diabetes Brother   . Heart disease Brother   . Hypertension Brother   . Hyperlipidemia Brother     History   Social History  . Marital Status: Married    Spouse Name: N/A  . Number of Children: 0  . Years of Education: college   Occupational History  . Retail    Social History Main Topics  . Smoking status: Never Smoker   . Smokeless tobacco: Never Used     Comment: Never used tobacco  . Alcohol Use: No  . Drug Use: No  . Sexual Activity: No     Comment: lives alone, no dietary restrictions except avoid fresh veg, fruit, whole grains   Other Topics Concern  . Not on file   Social History Narrative   Patient is married ([name]) and lives with her husband.   Patient does not have any children.   Patient is right-handed.   Patient has a BA degree.   One caffeine drink daily     Current Outpatient Prescriptions on File Prior to Visit  Medication Sig Dispense Refill  . acetaminophen (TYLENOL) 500 MG tablet Take 2 tablets (1,000 mg total) by mouth every 6 (six) hours as needed for moderate pain. 30 tablet 0  . amLODipine (NORVASC) 5 MG tablet Take 1 tablet by mouth  daily 90 tablet 0  . aspirin 81 MG tablet Take 81 mg by mouth daily.    . calcium-vitamin D (OSCAL WITH D) 500-200 MG-UNIT per tablet Take 1 tablet by mouth.    . cholecalciferol (VITAMIN D) 1000 UNITS tablet Take 1,000 Units by mouth daily.      Marland Kitchen dicyclomine (BENTYL) 10 MG capsule Take 1 capsule (10 mg total) by mouth every 6 (six) hours as needed (abdominal pain- may take 2 capsules if 1 ineffective). 90 capsule 5  . esomeprazole (NEXIUM) 40 MG capsule Take 1 capsule (40 mg total) by mouth daily before breakfast. 90 capsule 3  . FOLBIC 2.5-25-2 MG TABS TAKE ONE TABLET BY MOUTH EVERY DAY 90 tablet 3  . metFORMIN (GLUCOPHAGE-XR) 500 MG 24 hr tablet Take 2 tablets (1,000 mg total) by mouth 2 (two) times daily. 180 tablet 1  . metoCLOPramide (REGLAN) 5 MG  tablet Take 1 tablet (5 mg total) by mouth 2 (two) times daily. 180 tablet 1  . metoprolol (LOPRESSOR) 50 MG tablet Take 1 tablet (50 mg total) by mouth 2 (two) times   daily. 180 tablet 3  . Multiple Vitamin (MULTIVITAMIN) tablet Take 1 tablet by mouth daily.      . ondansetron (ZOFRAN) 4 MG tablet Take 4 mg by mouth every 4 (four) hours as needed. For nausea      .  potassium chloride (K-DUR,KLOR-CON) 10 MEQ tablet Take 1 tablet (10 mEq total) by mouth daily. 90 tablet 3  . rosuvastatin (CRESTOR) 10 MG tablet Take 1 tablet (10 mg total) by mouth daily. 28 tablet 0  . valsartan (DIOVAN) 320 MG tablet TAKE 1 TABLET (320 MG TOTAL) BY MOUTH DAILY. 90 tablet 1   No current facility-administered medications on file prior to visit.    Allergies  Allergen Reactions  . Tramadol     Dizziness     Review of Systems  Review of Systems  Constitutional: Positive for malaise/fatigue. Negative for fever.  HENT: Negative for congestion.   Eyes: Negative for discharge.  Respiratory: Negative for shortness of breath.   Cardiovascular: Negative for chest pain, palpitations and leg swelling.  Gastrointestinal: Negative for nausea, abdominal pain and diarrhea.  Genitourinary: Negative for dysuria.  Musculoskeletal: Positive for joint pain and neck pain. Negative for falls.  Skin: Negative for rash.  Neurological: Positive for tingling, sensory change and headaches. Negative for loss of consciousness.  Endo/Heme/Allergies: Negative for polydipsia.  Psychiatric/Behavioral: Negative for depression and suicidal ideas. The patient is not nervous/anxious and does not have insomnia.     Objective  BP 120/70 mmHg  Pulse 63  Temp(Src) 98.1 F (36.7 C) (Oral)  Ht 5\' 1"  (1.549 m)  Wt 175 lb 4 oz (79.493 kg)  BMI 33.13 kg/m2  SpO2 97%  LMP 02/07/1992  Physical Exam  Physical Exam  Constitutional: She is oriented to person, place, and time and well-developed, well-nourished, and in no distress. No  distress.  HENT:  Head: Normocephalic and atraumatic.  Eyes: Conjunctivae are normal.  Neck: Neck supple. No thyromegaly present.  Cardiovascular: Normal rate, regular rhythm and normal heart sounds.   Pulmonary/Chest: Effort normal and breath sounds normal. She has no wheezes.  Abdominal: She exhibits no distension and no mass.  Musculoskeletal: She exhibits no edema.  Lymphadenopathy:    She has no cervical adenopathy.  Neurological: She is alert and oriented to person, place, and time.  Skin: Skin is warm and dry. No rash noted. She is not diaphoretic.  Psychiatric: Memory, affect and judgment normal.    Lab Results  Component Value Date   TSH 1.54 08/11/2014   Lab Results  Component Value Date   WBC 6.4 08/11/2014   HGB 12.6 08/11/2014   HCT 38.2 08/11/2014   MCV 83.7 08/11/2014   PLT 200.0 08/11/2014   Lab Results  Component Value Date   CREATININE 0.63 08/11/2014   BUN 12 08/11/2014   NA 135 08/11/2014   K 4.0 08/11/2014   CL 100 08/11/2014   CO2 25 08/11/2014   Lab Results  Component Value Date   ALT 28 08/11/2014   AST 22 08/11/2014   ALKPHOS 52 08/11/2014   BILITOT 0.6 08/11/2014   Lab Results  Component Value Date   CHOL 191 08/11/2014   Lab Results  Component Value Date   HDL 59.50 08/11/2014   Lab Results  Component Value Date   LDLCALC 109* 08/11/2014   Lab Results  Component Value Date   TRIG 115.0 08/11/2014   Lab Results  Component Value Date   CHOLHDL 3 08/11/2014     Assessment & Plan   Essential hypertension Well controlled, no changes to meds. Encouraged heart healthy diet such as the DASH diet and exercise as tolerated.   GERD (gastroesophageal reflux disease) Avoid offending foods, take probiotics. Do not eat large meals in late evening and consider raising head of bed.  Continue Nexium as directed  HYPERCHOLESTEROLEMIA Tolerating statin, encouraged heart healthy diet, avoid trans fats, minimize simple carbs and  saturated fats. Increase exercise as tolerated  Diabetes mellitus type 2 in obese [id number] acceptable, minimize simple carbs. Increase exercise as tolerated. Continue current meds  Gastroparesis Patient describes some intermittent LLQ pain that is relieved with BM and partially relieved with Bentyl use, can consider Hyoscyamine in future if symptoms worsen.  TIA (transient ischemic attack) Unclear history, notes her first episode occurred in her 30s with some weakness/numbness in left hand/arm and left eye blindness. This resolved spontaneously since then she has had a couple of recurrences but they had also resolved although with further discussion she notes numbness and weakness intermittently in left arm and leg. She notes this has worsened recently and she is noting worsening burning, pressure in left forehead and left cheek. Xray showed significant cervical spinal stenosis but she declines MRI today. CT of head negative for intracranial pathology in October of 2015. Suspect symptoms due to Trigeminal Neuralgia and spinal stenosis but will refer to neurology for further consideration at this time.  Vitamin D deficiency Will check level with next blood draw  Paroxysmal supraventricular tachycardia RRR today

## 2014-08-14 NOTE — Assessment & Plan Note (Signed)
Well controlled, no changes to meds. Encouraged heart healthy diet such as the DASH diet and exercise as tolerated.  °

## 2014-08-14 NOTE — Progress Notes (Signed)
Pre visit review using our clinic review tool, if applicable. No additional management support is needed unless otherwise documented below in the visit note. 

## 2014-08-14 NOTE — Assessment & Plan Note (Addendum)
Avoid offending foods, take probiotics. Do not eat large meals in late evening and consider raising head of bed. Continue Nexium as directed

## 2014-08-14 NOTE — Assessment & Plan Note (Signed)
Tolerating statin, encouraged heart healthy diet, avoid trans fats, minimize simple carbs and saturated fats. Increase exercise as tolerated 

## 2014-08-14 NOTE — Assessment & Plan Note (Signed)
hgba1c acceptable, minimize simple carbs. Increase exercise as tolerated. Continue current meds 

## 2014-08-14 NOTE — Assessment & Plan Note (Signed)
Patient describes some intermittent LLQ pain that is relieved with BM and partially relieved with Bentyl use, can consider Hyoscyamine in future if symptoms worsen.

## 2014-08-18 ENCOUNTER — Telehealth: Payer: Self-pay | Admitting: Family Medicine

## 2014-08-18 DIAGNOSIS — H04123 Dry eye syndrome of bilateral lacrimal glands: Secondary | ICD-10-CM | POA: Diagnosis not present

## 2014-08-18 NOTE — Telephone Encounter (Signed)
I will try [facility]

## 2014-08-18 NOTE — Telephone Encounter (Signed)
Caller name: Puchalski, Krithika N  Relation to pt: self  Call back number: [phone]    Reason for call:  Pt states she can not wait until 10/06/14 for neurology appointment due to pain. Pt states is there another doctor office that can see her sooner. Please advise

## 2014-08-19 ENCOUNTER — Other Ambulatory Visit: Payer: Self-pay | Admitting: Cardiology

## 2014-08-23 ENCOUNTER — Encounter: Payer: Self-pay | Admitting: Family Medicine

## 2014-08-23 DIAGNOSIS — M26609 Unspecified temporomandibular joint disorder, unspecified side: Secondary | ICD-10-CM

## 2014-08-23 HISTORY — DX: Unspecified temporomandibular joint disorder, unspecified side: M26.609

## 2014-08-23 NOTE — Assessment & Plan Note (Signed)
RRR today 

## 2014-08-24 ENCOUNTER — Other Ambulatory Visit: Payer: Self-pay | Admitting: Family Medicine

## 2014-08-24 ENCOUNTER — Telehealth: Payer: Self-pay | Admitting: Family Medicine

## 2014-08-24 NOTE — Telephone Encounter (Signed)
Patient Name: [name] DOB: [birth year] Initial Comment Caller states she has left side numbness, has been referred to neurology but unable to get appt, xfer from office for triage Nurse Assessment Nurse: Ronnald Ramp, RN, Miranda Date/Time (Eastern Time): 08/24/2014 11:31:58 AM Confirm and document reason for call. If symptomatic, describe symptoms. ---Caller states she is having pain in the left side of her neck, head, upper back, face, and arm. Has the patient traveled out of the country within the last 30 days? ---Not Applicable Does the patient require triage? ---Yes Related visit to physician within the last 2 weeks? ---No Does the PT have any chronic conditions? (i.e. diabetes, asthma, etc.) ---Yes List chronic conditions. ---High Cholesterol, Diabetes, GI, HTN, Guidelines Guideline Title Affirmed Question Affirmed Notes Neck Pain or Stiffness Numbness in an arm or hand (i.e., loss of sensation) Final Disposition User See Physician within 24 Hours Ronnald Ramp, RN, Miranda Comments No appt available with Dr. Charlett Blake within 24 hours. Appt scheduled for tomorrow 7/19 at 1030 with Elyn Aquas. Caller states she was seen by [facility] in Dec 2015, suggested she contact them to see if they can get her in sooner than [facility] since she is already a current pt. Referrals REFERRED TO PCP OFFICE Disagree/Comply: Comply

## 2014-08-24 NOTE — Telephone Encounter (Signed)
FYI please see below-  (Patient reported these symptoms on 08/14/14 in office visit.)

## 2014-08-25 ENCOUNTER — Ambulatory Visit: Payer: Medicare Other | Admitting: Physician Assistant

## 2014-08-25 NOTE — Telephone Encounter (Signed)
Pt has appt with Dr Leta Baptist on 09/02/14

## 2014-09-02 ENCOUNTER — Encounter: Payer: Self-pay | Admitting: Diagnostic Neuroimaging

## 2014-09-02 ENCOUNTER — Ambulatory Visit (INDEPENDENT_AMBULATORY_CARE_PROVIDER_SITE_OTHER): Payer: Medicare Other | Admitting: Diagnostic Neuroimaging

## 2014-09-02 VITALS — BP 144/72 | HR 65 | Ht 61.0 in | Wt 176.0 lb

## 2014-09-02 DIAGNOSIS — M5481 Occipital neuralgia: Secondary | ICD-10-CM

## 2014-09-02 DIAGNOSIS — M5010 Cervical disc disorder with radiculopathy, unspecified cervical region: Secondary | ICD-10-CM

## 2014-09-02 DIAGNOSIS — M542 Cervicalgia: Secondary | ICD-10-CM

## 2014-09-02 NOTE — Progress Notes (Signed)
PATIENT: [name] DOB: February 26, 1940   REASON FOR VISIT: follow up HISTORY FROM: patient  HISTORY OF PRESENT ILLNESS:  UPDATE 09/02/14: Since last visit, was stable, until last few months with increasing left sided symptoms. Stress/emotional issues are better, but still present. Left face, arm and leg are affected.   UPDATE 07/16/13 (VRP): Since last visit, no further visual events. Unfortunately, her husband passed away 1 month ago, and she is continuing to grieve and struggle with stress. In last 2-3 weeks, she has had left neck pain, left ear pain, left temple pain, left arm pain/numbness, that is intermittent. Trouble laying on her left side.   UPDATE 01/15/13 (LL):  Since last visit, patient has seen cardiology; cardiac event monitor did not show any arrhythmias.  Her carotid doppler was negative for significant stenosis. Her CRP and ESR were normal.  MRI brain shows no acute intracranial abnormality or significant interval change.  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 This is likely related to chronic atrophy or congenital anomaly. She has had no further vision problems or stroke/TIA symptoms.  She was switched to Plavix at her last visit here but has since switched back to aspirin on her own due to a sore feeling in her throat while taking plavix.  She states her tongue felt thick and the neck (points to cervical lymph nodes) hurt.  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  10/16/12 (VP):  Approximately 2 weeks ago, patient had been watching television when all of a sudden everything went out of focus. She then noticed alternating white and black sensation from her left eye. This lasted for approximately 15 minutes. She says she cannot see any objects out of her left eye when she covered her right eye with her hand. She says that she remembers a "white" vision rather than fully black. She had some dizziness at that  time. She checked her blood pressure which was elevated systolic blood pressure greater than 170. She also noted some left eye pain and left arm weakness during this event. Within 15 minutes symptoms had resolved. No headache during this event. Patient has had prior episodes suspicious for TIA in the past in 2012 and 2013. Patient sees cardiology for evaluation and management of supraventricular tachycardia episode.   REVIEW OF SYSTEMS: Full 14 system review of systems performed and notable only for ear pain runny nose abd pain diarrhea leg swelling palpitations murmur back pain aching muscles cramps neck pain neck stiffness dizziness numbness weakness.  ALLERGIES: Allergies  Allergen Reactions  . Tramadol     Dizziness     HOME MEDICATIONS: Outpatient Prescriptions Prior to Visit  Medication Sig Dispense Refill  . acetaminophen (TYLENOL) 500 MG tablet Take 2 tablets (1,000 mg total) by mouth every 6 (six) hours as needed for moderate pain. 30 tablet 0  . amLODipine (NORVASC) 5 MG tablet Take 1 tablet by mouth  daily 90 tablet 0  . aspirin 81 MG tablet Take 81 mg by mouth daily.    . calcium-vitamin D (OSCAL WITH D) 500-200 MG-UNIT per tablet Take 1 tablet by mouth.    . cholecalciferol (VITAMIN D) 1000 UNITS tablet Take 1,000 Units by mouth daily.      Marland Kitchen dicyclomine (BENTYL) 10 MG capsule Take 1 capsule (10 mg total) by mouth every 6 (six) hours as needed (abdominal pain- may take 2 capsules if 1 ineffective). 90 capsule 5  . esomeprazole (NEXIUM) 40 MG  capsule Take 1 capsule (40 mg total) by mouth daily before breakfast. 90 capsule 3  . FOLBIC 2.5-25-2 MG TABS TAKE ONE TABLET BY MOUTH EVERY DAY 90 tablet 3  . metFORMIN (GLUCOPHAGE-XR) 500 MG 24 hr tablet Take 2 tablets (1,000 mg total) by mouth 2 (two) times daily. 180 tablet 1  . metoCLOPramide (REGLAN) 5 MG tablet Take 1 tablet (5 mg total) by mouth 2 (two) times daily. 180 tablet 1  . metoprolol (LOPRESSOR) 50 MG tablet Take 1 tablet  (50 mg total) by mouth 2 (two) times   daily. 180 tablet 3  . Multiple Vitamin (MULTIVITAMIN) tablet Take 1 tablet by mouth daily.      . ondansetron (ZOFRAN) 4 MG tablet Take 4 mg by mouth every 4 (four) hours as needed. For nausea      . potassium chloride (K-DUR,KLOR-CON) 10 MEQ tablet Take 1 tablet by mouth  daily 60 tablet 2  . rosuvastatin (CRESTOR) 10 MG tablet Take 0.5-1 tablets (5-10 mg total) by mouth daily. 1 WHOLE TABLET 4 DAYS A WEEK AND I/2 TABLET 3 DAYS A WEEK 45 tablet 0  . valsartan (DIOVAN) 320 MG tablet Take 1 tablet by mouth  daily 90 tablet 2   No facility-administered medications prior to visit.    PAST MEDICAL HISTORY: Past Medical History  Diagnosis Date  . Gastroparesis   . Pure hypercholesterolemia   . Headache(784.0)   . Unspecified essential hypertension   . Esophageal reflux   . Type II or unspecified type diabetes mellitus without mention of complication, not stated as uncontrolled   . Arthritis     Spinal Osteoarthritis  . Diverticulosis 08/30/2000    Colonoscopy   . Gastric polyp     Fundic Gland  . Anxiety   . Cataract   . Heart murmur     Echocardiogram 2/11: EF 60-65%, mild LAE, grade 1 diastolic dysfunction, aortic valve sclerosis, mean gradient 9 mm of mercury, PASP 34  . Chest pain     Myoview 12/15 no ischemia.  . Carcinoid tumor of stomach   . PSVT (paroxysmal supraventricular tachycardia)   . Leg swelling     bilateral  . Chronic kidney disease     Left kidney smaller than right kidney  . Cancer   . PONV (postoperative nausea and vomiting)     pt states only needs small amount of anesthesia  . Stroke     tia, 2014  . Iron deficiency anemia, unspecified   . Encounter for preventative adult health care exam with abnormal findings 09/14/2013  . Unspecified hereditary and idiopathic peripheral neuropathy 10/29/2013  . Diabetic peripheral neuropathy 10/29/2013  . Diabetes mellitus type 2 in obese 09/05/2006    Qualifier: Diagnosis of  By:  Marca Ancona RMA, Lucy    . Anemia 06/08/2014  . 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 Cholecystectomy  1993  . Tonsillectomy    . Colonoscopy  11/11/2010    diverticulosis  . Esophagogastroduodenoscopy  08/29/2010; 09/15/2010    Carcinoid tumor less than 1 cm in July 2012 not seen in August 2012 , gastritis, fundic gland polyps  . Eus  12/15/2010    Procedure: UPPER ENDOSCOPIC ULTRASOUND (EUS) LINEAR;  Surgeon: Owens Loffler, MD;  Location: [facility];  Service: Endoscopy;  Laterality: N/A;  . Esophagogastroduodenoscopy  05/16/2011  . Esophagogastroduodenoscopy  06/14/2012  . Dilatation & currettage/hysteroscopy with resectocope N/A 02/25/2013  Procedure: Attempted hysteroscopy with uterine perforation;  Surgeon: Jamey Reas de Berton Lan, MD;  Location: [facility];  Service: Gynecology;  Laterality: N/A;  . Laparoscopy N/A 02/25/2013    Procedure: Cystoscopy and laparoscopy with fulguration of uterine serosa;  Surgeon: Jamey Reas de Berton Lan, MD;  Location: [facility];  Service: Gynecology;  Laterality: N/A;    FAMILY HISTORY: Family History  Problem Relation Age of Onset  . Diabetes Mother   . Stroke Father     deceased age 4  . Heart disease Sister     deceased MI age 98  . Heart disease Brother     deceased MI age 56  . Colon cancer Neg Hx   . Esophageal cancer Neg Hx   . Stomach cancer Neg Hx   . Rectal cancer Neg Hx   . Diabetes Maternal Grandmother   . Hypertension Paternal Grandmother   . Diabetes Sister   . Heart disease Sister   . Hypertension Sister   . Hyperlipidemia Sister   . Diabetes Sister   . Heart disease Sister   . Hypertension Sister   . Hyperlipidemia Sister   . Diabetes Brother   . Heart disease Brother   . Hypertension Brother   . Hyperlipidemia Brother     SOCIAL HISTORY: History   Social History  . Marital Status: Married    Spouse Name:  N/A  . Number of Children: 0  . Years of Education: college   Occupational History  . Retail    Social History Main Topics  . Smoking status: Never Smoker   . Smokeless tobacco: Never Used     Comment: Never used tobacco  . Alcohol Use: No  . Drug Use: No  . Sexual Activity: No     Comment: lives alone, no dietary restrictions except avoid fresh veg, fruit, whole grains   Other Topics Concern  . Not on file   Social History Narrative   Patient is married ([name]) and lives with her husband.   Patient does not have any children.   Patient is right-handed.   Patient has a BA degree.   One caffeine drink daily      PHYSICAL EXAM  Filed Vitals:   09/02/14 1311  BP: 144/72  Pulse: 65  Height: 5' 1"  (1.549 m)  Weight: 176 lb (79.833 kg)   Body mass index is 33.27 kg/(m^2).  GENERAL EXAM: Patient is in no distress; well developed, nourished and groomed; neck is supple  CARDIOVASCULAR: Regular rate and rhythm, MILD SYSTOLIC MURMUR, no carotid bruits  NEUROLOGIC: MENTAL STATUS: awake, alert, language fluent, comprehension intact, naming intact, fund of knowledge appropriate; SOFT SPOKEN.  CRANIAL NERVE: pupils equal and reactive to light, visual fields full to confrontation, extraocular muscles intact, no nystagmus, facial sensation and strength symmetric, hearing intact, palate elevates symmetrically, uvula midline, shoulder shrug symmetric, tongue midline. MOTOR: normal bulk and tone, full strength in the BUE, BLE SENSORY: normal and symmetric to light touch  COORDINATION: finger-nose-finger, fine finger movements normal REFLEXES: deep tendon reflexes present and symmetric GAIT/STATION: narrow based gait   DIAGNOSTIC DATA (LABS, IMAGING, TESTING) - I reviewed patient records, labs, notes, testing and imaging myself where available.  Lab Results  Component Value Date   WBC 6.4 08/11/2014   HGB 12.6 08/11/2014   HCT 38.2 08/11/2014   MCV 83.7 08/11/2014   PLT  200.0 08/11/2014      Component Value Date/Time   NA 135 08/11/2014 0758  K 4.0 08/11/2014 0758   CL 100 08/11/2014 0758   CO2 25 08/11/2014 0758   GLUCOSE 105* 08/11/2014 0758   BUN 12 08/11/2014 0758   CREATININE 0.63 08/11/2014 0758   CREATININE 0.71 05/04/2014 0829   CALCIUM 9.5 08/11/2014 0758   PROT 7.1 08/11/2014 0758   ALBUMIN 4.0 08/11/2014 0758   AST 22 08/11/2014 0758   ALT 28 08/11/2014 0758   ALKPHOS 52 08/11/2014 0758   BILITOT 0.6 08/11/2014 0758   GFRNONAA 85 05/04/2014 0829   GFRNONAA 88* 11/13/2013 0250   GFRAA >89 05/04/2014 0829   GFRAA >90 11/13/2013 0250   Lab Results  Component Value Date   CHOL 191 08/11/2014   HDL 59.50 08/11/2014   LDLCALC 109* 08/11/2014   LDLDIRECT 123.7 04/13/2011   TRIG 115.0 08/11/2014   CHOLHDL 3 08/11/2014   Lab Results  Component Value Date   HGBA1C 5.8 08/11/2014   Lab Results  Component Value Date   VITAMINB12 >1500 pg/mL* 12/14/2008   Lab Results  Component Value Date   TSH 1.54 08/11/2014   Lab Results  Component Value Date   ESRSEDRATE 18 08/14/2014    10/01/12 MRI brain 1. No acute intracranial abnormality or significant interval change.  2. 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 This is likely related to chronic atrophy or congenital anomaly.  10/31/12 carotid u/s - normal  10/25/12 TTE - 55-60%; no source of emboli  11/12/13 CT head  1. No acute intracranial pathology seen on CT. 2. Mild cortical volume loss noted.   ASSESSMENT AND PLAN  74 y.o. year old female here with left face, neck, arm pain since 2015. May be migraine phenomenon vs cervical radiculopathy. Will try conservative therapy.   PLAN: - PT evaluation for neck pain --> patient requests [facility] location - consider massage / relaxation techniques as well - tylenol prn neck pain - consider stress mgmt techniques (psychology, exercise, new activities, volunteer work) - may  consider amitriptyline 47m qhs or gabapentin 3062mqhs in future if not improved  Orders Placed This Encounter  Procedures  . Ambulatory referral to Physical Therapy   Return in about 3 months (around 12/03/2014).    VIPenni BombardMD 08/09/08/30132:1:43M Certified in Neurology, Neurophysiology and Neuroimaging  [facility] [address]Su[place]svillerMay Creek[place] [phone]

## 2014-09-02 NOTE — Patient Instructions (Signed)
Try physical therapy

## 2014-09-08 ENCOUNTER — Telehealth: Payer: Self-pay | Admitting: Diagnostic Neuroimaging

## 2014-09-08 ENCOUNTER — Telehealth: Payer: Self-pay | Admitting: *Deleted

## 2014-09-08 ENCOUNTER — Encounter: Payer: Self-pay | Admitting: Internal Medicine

## 2014-09-08 NOTE — Telephone Encounter (Signed)
Referral redirected to [facility] in [facility]. Confirmed with staff at that facility. She stated she would call patient to set up PT.

## 2014-09-08 NOTE — Telephone Encounter (Signed)
Pt called and would like to know if her PT order could be sent to the [facility] facility in [facility]. Please call and advise  [phone]

## 2014-09-08 NOTE — Telephone Encounter (Signed)
Patient cancelled new patient appointment for 8/30 referring provider notified

## 2014-09-14 ENCOUNTER — Ambulatory Visit: Payer: Medicare Other | Attending: Diagnostic Neuroimaging | Admitting: Physical Therapy

## 2014-09-14 DIAGNOSIS — G8929 Other chronic pain: Secondary | ICD-10-CM | POA: Diagnosis not present

## 2014-09-14 DIAGNOSIS — M5010 Cervical disc disorder with radiculopathy, unspecified cervical region: Secondary | ICD-10-CM | POA: Insufficient documentation

## 2014-09-14 DIAGNOSIS — M542 Cervicalgia: Secondary | ICD-10-CM | POA: Diagnosis not present

## 2014-09-14 NOTE — Therapy (Signed)
[facility] [address]  [place], [place], [address] Phone: [phone]   Fax:  [phone]  Physical Therapy Evaluation  Patient Details  Name: [name] MRN: [id number] Date of Birth: [birth year] Referring Provider:  Penni Bombard, MD  Encounter Date: 09/14/2014      PT End of Session - 09/14/14 [place]    Visit Number 1   Number of Visits 16   Date for PT Re-Evaluation 11/09/14   PT Start Time 1404   PT Stop Time 1448   PT Time Calculation (min) 44 min   Activity Tolerance Patient limited by pain   Behavior During Therapy [facility] for tasks assessed/performed      Past Medical History  Diagnosis Date  . Gastroparesis   . Pure hypercholesterolemia   . Headache(784.0)   . Unspecified essential hypertension   . Esophageal reflux   . Type II or unspecified type diabetes mellitus without mention of complication, not stated as uncontrolled   . Arthritis     Spinal Osteoarthritis  . Diverticulosis 08/30/2000    Colonoscopy   . Gastric polyp     Fundic Gland  . Anxiety   . Cataract   . Heart murmur     Echocardiogram 2/11: EF 60-65%, mild LAE, grade 1 diastolic dysfunction, aortic valve sclerosis, mean gradient 9 mm of mercury, PASP 34  . Chest pain     Myoview 12/15 no ischemia.  . Carcinoid tumor of stomach   . PSVT (paroxysmal supraventricular tachycardia)   . Leg swelling     bilateral  . Chronic kidney disease     Left kidney smaller than right kidney  . Cancer   . PONV (postoperative nausea and vomiting)     pt states only needs small amount of anesthesia  . Stroke     tia, 2014  . Iron deficiency anemia, unspecified   . Encounter for preventative adult health care exam with abnormal findings 09/14/2013  . Unspecified hereditary and idiopathic peripheral neuropathy 10/29/2013  . Diabetic peripheral neuropathy 10/29/2013  . Diabetes mellitus type 2 in obese 09/05/2006    Qualifier:  Diagnosis of  By: Marca Ancona RMA, Lucy    . Anemia 06/08/2014  . 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 Cholecystectomy  1993  . Tonsillectomy    . Colonoscopy  11/11/2010    diverticulosis  . Esophagogastroduodenoscopy  08/29/2010; 09/15/2010    Carcinoid tumor less than 1 cm in July 2012 not seen in August 2012 , gastritis, fundic gland polyps  . Eus  12/15/2010    Procedure: UPPER ENDOSCOPIC ULTRASOUND (EUS) LINEAR;  Surgeon: Owens Loffler, MD;  Location: [facility];  Service: Endoscopy;  Laterality: N/A;  . Esophagogastroduodenoscopy  05/16/2011  . Esophagogastroduodenoscopy  06/14/2012  . Dilatation & currettage/hysteroscopy with resectocope N/A 02/25/2013    Procedure: Attempted hysteroscopy with uterine perforation;  Surgeon: Jamey Reas de Berton Lan, MD;  Location: [facility];  Service: Gynecology;  Laterality: N/A;  . Laparoscopy N/A 02/25/2013    Procedure: Cystoscopy and laparoscopy with fulguration of uterine serosa;  Surgeon: Jamey Reas de Berton Lan, MD;  Location: [facility];  Service: Gynecology;  Laterality: N/A;    There were no vitals filed for this visit.  Visit Diagnosis:  Neck pain of over 3 months duration  Neck pain on left side  Cervical disc disorder with radiculopathy of  cervical region      Subjective Assessment - 09/14/14 1407    Subjective Patient states she went to the doctor with pain in her head, neck and left arm and he referred her to PT. Pain is pretty much constant with occasional radicular nubness and tingling down left arm. Pain prevents her from sleeping on either side (sleeps in adjustable bed with HOB elevated and 1 pillow), looking and reaching overhead. Also c/o dizziness with changing positions too quickly, especially from lying to sitting and sitting to standing.   Limitations Sitting   How long can you sit comfortably? 15 minutes   Diagnostic tests Cervical x-ray  (10/20/13) - significant neural foraminal narrowing on the left at C3-C4 and C4-C5, moderate to severe. This may lead to impingement of the exiting C3 or C4 or both nerve roots.   Patient Stated Goals "To be pain free"   Currently in Pain? Yes   Pain Score 7    Pain Location Neck   Pain Orientation Left;Lateral   Pain Descriptors / Indicators Stabbing;Sharp   Pain Radiating Towards Radiation with numbness and tingling all the way to left hand   Pain Onset More than a month ago  >1 year   Pain Frequency Constant   Aggravating Factors  Sitting for a long time, cervical flexion & extension, sitting at computer   Pain Relieving Factors "Standing is better than sitting", Tylenol (minimal relief), warm shower, rest/sleep   Effect of Pain on Daily Activities Pain limits ability to complete housekeeping, use computer, watch TV   Multiple Pain Sites No            [facility] PT Assessment - 09/14/14 1420    Assessment   Medical Diagnosis Neck pain, cervical disc disorder with radiculopathy   Onset Date/Surgical Date --  > 1 yr ago   Prior Therapy none   Prior Function   Level of Independence Independent   Observation/Other Assessments   Focus on Therapeutic Outcomes (FOTO)  38% (62% limited); predicted 56% (44% limited)   Posture/Postural Control   Posture/Postural Control Postural limitations   Postural Limitations Rounded Shoulders;Forward head;Increased thoracic kyphosis   Posture Comments unable toto tolerate cervical retraction, passively or actively   ROM / Strength   AROM / PROM / Strength AROM;Strength   AROM   Overall AROM Comments Bilateral shoulder ROM symmetrical with flexion and abduction limited to ~130 dg; pain limiting all cervical ROM   AROM Assessment Site Cervical   Cervical Flexion 28   Cervical Extension 4   Cervical - Right Side Bend 18   Cervical - Left Side Bend 21   Cervical - Right Rotation 40   Cervical - Left Rotation 38   Strength   Overall Strength  Comments Pain with all resisted movements on left   Strength Assessment Site Shoulder   Right/Left Shoulder Right;Left   Right Shoulder Flexion 4/5   Right Shoulder ABduction 4/5   Right Shoulder Internal Rotation 4/5   Right Shoulder External Rotation 4/5   Left Shoulder Flexion 4-/5   Left Shoulder ABduction 4-/5   Left Shoulder Internal Rotation 4-/5   Left Shoulder External Rotation 3+/5   Flexibility   Soft Tissue Assessment /Muscle Length --   Palpation   Palpation comment ttp in bilateral suboccipitals, left lateral c-spine, upper trap                    PT Short Term Goals - 09/14/14 1855    PT SHORT  TERM GOAL #1   Title Independent with initial HEP (10/12/14)   Time 4   Period Weeks   Status New           PT Long Term Goals - October 12, 2014 1855    PT LONG TERM GOAL #1   Title Independent with advanced HEP as indicated (11/09/14)   Time 4   Period Weeks   Status New   PT LONG TERM GOAL #2   Title Patient will demonstrate increased cervical extension ROM by 20 degrees, and remaining movements by 5 degrees to allow for functional ROM of neck (11/09/14)   Baseline Ext 4 dg; Flex 28 dg; SB R 18 dg, L21 dg; Rot R 40, L 38   Time 8   Period Weeks   Status New   PT LONG TERM GOAL #3   Title Patient will tolerate sitting for 30 minutes or greater without increased pain while watching TV or working on computer (11/09/14)   Time 8   Period Weeks   Status New   PT LONG TERM GOAL #4   Title Patient will demonstrate or at least verbalize awareness of neutral neck and shoulder posture (11/09/14)   Time 8   Period Weeks   Status New               Plan - 10/12/14 1832    Clinical Impression Statement Patient presents to [facility] with greater than 1 year h/o neck pain with intermittent radicular numbness and tingling down left UE. Pain is nearly constant and significantly limits cervical ROM in all directions. Patient demonstrates forward head posture with rounded  shoulders and some thoraric kyphosis from which patient is unable to achieve neutral spinal posture. Bilateral shoulder ROM is symmetrical but limted to ~130 of elevation in flexion and abduction. Left shoulder is mildly weaker than right, primarily secondary to guarding from pain in neck. Pain prevents patient from sitting comfortably to work on computer or watch TV and looking and/or reaching overhead. Patient does report some dizziness at times with rapid change in postition, with presentation at this time appearing to be orthostatic in nature, but will monitor for signs of BPPV.   Pt will benefit from skilled therapeutic intervention in order to improve on the following deficits Pain;Postural dysfunction;Impaired flexibility;Decreased range of motion;Impaired sensation;Increased muscle spasms;Impaired UE functional use;Impaired perceived functional ability;Decreased activity tolerance;Hypomobility;Dizziness   Rehab Potential Fair   PT Frequency 2x / week   PT Duration 8 weeks   PT Treatment/Interventions Therapeutic exercise;Therapeutic activities;Neuromuscular re-education;Moist Heat;Ultrasound;Electrical Stimulation;Manual techniques;Traction;Passive range of motion;Vestibular;Canalith Repostioning;Patient/family education   PT Next Visit Plan HEP instruction, manual therapy including STM as tolerated, postural training, cervical & UE exercises, ?traction, modalities PRN   Consulted and Agree with Plan of Care Patient          G-Codes - 10-12-2014 1914    Functional Assessment Tool Used FOTO = 38% (62% limitation)   Functional Limitation Changing and maintaining body position   Changing and Maintaining Body Position Current Status (P3825) At least 60 percent but less than 80 percent impaired, limited or restricted   Changing and Maintaining Body Position Goal Status (K5397) At least 40 percent but less than 60 percent impaired, limited or restricted  predicted FOTO = 56% (44% limitation)        Problem List Patient Active Problem List   Diagnosis Date Noted  . 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 Anemia 06/08/2014  . Dysuria 01/07/2014  . RLS (  restless legs syndrome) 11/23/2013  . Elevated troponin 11/13/2013  . Chest pain 11/13/2013  . Diabetic peripheral neuropathy 10/29/2013  . Left-sided thoracic back pain 10/06/2013  . Encounter for preventative adult health care exam with abnormal findings 09/14/2013  . Iron deficiency anemia   . Status post laparoscopy 02/25/2013  . GERD (gastroesophageal reflux disease) 01/09/2013  . Amaurosis fugax of left eye 10/16/2012  . Low back pain 06/03/2012  . Vitamin D deficiency 03/11/2012  . Bilateral hand pain 10/20/2011  . Encounter for long-term (current) use of other medications 10/20/2011  . IBS (irritable bowel syndrome) 08/14/2011  . TIA (transient ischemic attack) 02/10/2011  . Abnormal brain MRI 01/19/2011  . Allergic rhinitis 10/01/2010  . 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 FUNDIC GLAND POLYPS OF STOMACH 03/18/2010  . EPIGASTRIC PAIN chronic 03/18/2010  . ARTHRALGIA 03/17/2009  . SYSTOLIC MURMUR 79/72/8206  . Paroxysmal supraventricular tachycardia 01/12/2009  . PLANTAR FASCIITIS 06/08/2008  . CHEST PAIN 05/18/2008  . Gastroparesis 12/18/2007  . HYPERCHOLESTEROLEMIA 06/11/2007  . Diabetes mellitus type 2 in obese 09/05/2006  . Essential hypertension 09/05/2006    Percival Spanish, PT, MPT 09/14/2014, 7:22 PM  [facility] [address]  [place], [place], [address] Phone: [phone]   Fax:  [phone]

## 2014-09-16 ENCOUNTER — Ambulatory Visit: Payer: Medicare Other | Admitting: Physical Therapy

## 2014-09-16 DIAGNOSIS — M5010 Cervical disc disorder with radiculopathy, unspecified cervical region: Secondary | ICD-10-CM

## 2014-09-16 DIAGNOSIS — G8929 Other chronic pain: Secondary | ICD-10-CM | POA: Diagnosis not present

## 2014-09-16 DIAGNOSIS — M542 Cervicalgia: Secondary | ICD-10-CM | POA: Diagnosis not present

## 2014-09-16 NOTE — Therapy (Signed)
[facility] [address]  [place], [place], [address] Phone: [phone]   Fax:  [phone]  Physical Therapy Treatment  Patient Details  Name: [name] MRN: [id number] Date of Birth: Apr 08, 1940 Referring Provider:  Penni Bombard, MD  Encounter Date: 09/16/2014      PT End of Session - 09/16/14 1143    Visit Number 2   Number of Visits 16   Date for PT Re-Evaluation 11/09/14   PT Start Time 1106   PT Stop Time 1145   PT Time Calculation (min) 39 min   Activity Tolerance Patient tolerated treatment well;Patient limited by pain   Behavior During Therapy [facility] for tasks assessed/performed      Past Medical History  Diagnosis Date  . Gastroparesis   . Pure hypercholesterolemia   . Headache(784.0)   . Unspecified essential hypertension   . Esophageal reflux   . Type II or unspecified type diabetes mellitus without mention of complication, not stated as uncontrolled   . Arthritis     Spinal Osteoarthritis  . Diverticulosis 08/30/2000    Colonoscopy   . Gastric polyp     Fundic Gland  . Anxiety   . Cataract   . Heart murmur     Echocardiogram 2/11: EF 60-65%, mild LAE, grade 1 diastolic dysfunction, aortic valve sclerosis, mean gradient 9 mm of mercury, PASP 34  . Chest pain     Myoview 12/15 no ischemia.  . Carcinoid tumor of stomach   . PSVT (paroxysmal supraventricular tachycardia)   . Leg swelling     bilateral  . Chronic kidney disease     Left kidney smaller than right kidney  . Cancer   . PONV (postoperative nausea and vomiting)     pt states only needs small amount of anesthesia  . Stroke     tia, 2014  . Iron deficiency anemia, unspecified   . Encounter for preventative adult health care exam with abnormal findings 09/14/2013  . Unspecified hereditary and idiopathic peripheral neuropathy 10/29/2013  . Diabetic peripheral neuropathy 10/29/2013  . Diabetes mellitus type 2 in obese  09/05/2006    Qualifier: Diagnosis of  By: Marca Ancona RMA, Lucy    . Anemia 06/08/2014  . 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 Cholecystectomy  1993  . Tonsillectomy    . Colonoscopy  11/11/2010    diverticulosis  . Esophagogastroduodenoscopy  08/29/2010; 09/15/2010    Carcinoid tumor less than 1 cm in July 2012 not seen in August 2012 , gastritis, fundic gland polyps  . Eus  12/15/2010    Procedure: UPPER ENDOSCOPIC ULTRASOUND (EUS) LINEAR;  Surgeon: Owens Loffler, MD;  Location: [facility];  Service: Endoscopy;  Laterality: N/A;  . Esophagogastroduodenoscopy  05/16/2011  . Esophagogastroduodenoscopy  06/14/2012  . Dilatation & currettage/hysteroscopy with resectocope N/A 02/25/2013    Procedure: Attempted hysteroscopy with uterine perforation;  Surgeon: Jamey Reas de Berton Lan, MD;  Location: [facility];  Service: Gynecology;  Laterality: N/A;  . Laparoscopy N/A 02/25/2013    Procedure: Cystoscopy and laparoscopy with fulguration of uterine serosa;  Surgeon: Jamey Reas de Berton Lan, MD;  Location: [facility];  Service: Gynecology;  Laterality: N/A;    There were no vitals filed for this visit.  Visit Diagnosis:  Neck pain of over 3 months duration  Neck pain on left side  Cervical disc disorder  with radiculopathy of cervical region      Subjective Assessment - 09/16/14 1113    Subjective Patient reports increased pain/soreness after initial assessment. Pain about the same as normal today but with numbness down left arm to fingertips. Reports doing a lot of reading with book in lap casuing increased neck flexion.   Currently in Pain? Yes   Pain Score 7    Pain Location Neck   Pain Orientation Left;Lateral   Pain Descriptors / Indicators Sharp   Pain Radiating Towards Numbness radiating to fingertips of left hand                   [facility] Adult PT Treatment/Exercise - 09/16/14 1125     Exercises   Exercises Neck   Neck Exercises: Seated   Shoulder Rolls Backwards;10 reps   Other Seated Exercise Seated row/scapular retraction x10   Neck Exercises: Supine   Neck Retraction 10 reps;3 secs   Shoulder Flexion Both;10 reps   Shoulder Flexion Weights (lbs) 0   Shoulder Flexion Limitations pullover without weights   Shoulder ABduction Both;10 reps   Shoulder Abduction Weights (lbs) 0   Shoulder Abduction Limitations Horizonal Abduction   Other Supine Exercise Bilateral shoulder ER x10   Modalities   Modalities Moist Heat   Moist Heat Therapy   Number Minutes Moist Heat 10 Minutes   Moist Heat Location Cervical   Manual Therapy   Manual Therapy Soft tissue mobilization   Soft tissue mobilization Gentle SOC and upper trap   Neck Exercises: Stretches   Chest Stretch 60 seconds;2 reps   Chest Stretch Limitations supine                PT Education - 09/16/14 1123    Education provided Yes   Education Details Improved posture and positioning of book for reading   Person(s) Educated Patient   Methods Explanation;Demonstration   Comprehension Verbalized understanding          PT Short Term Goals - 09/14/14 1855    PT SHORT TERM GOAL #1   Title Independent with initial HEP (10/12/14)   Time 4   Period Weeks   Status New           PT Long Term Goals - 09/14/14 1855    PT LONG TERM GOAL #1   Title Independent with advanced HEP as indicated (11/09/14)   Time 4   Period Weeks   Status New   PT LONG TERM GOAL #2   Title Patient will demonstrate increased cervical extension ROM by 20 degrees, and remaining movements by 5 degrees to allow for functional ROM of neck (11/09/14)   Baseline Ext 4 dg; Flex 28 dg; SB R 18 dg, L21 dg; Rot R 40, L 38   Time 8   Period Weeks   Status New   PT LONG TERM GOAL #3   Title Patient will tolerate sitting for 30 minutes or greater without increased pain while watching TV or working on computer (11/09/14)   Time 8    Period Weeks   Status New   PT LONG TERM GOAL #4   Title Patient will demonstrate or at least verbalize awareness of neutral neck and shoulder posture (11/09/14)   Time 8   Period Weeks   Status New               Plan - 09/16/14 1144    Clinical Impression Statement Patient presents with head held foward in flexion with  significant guarding and c/o numbness radiating to left fingertips. Admits to spending a lot of time reading with book in lap contributing to forward flexed posture. Treatment focused on postural education and encouraging neck and shoulder retraction for improved posture. By end of visit patient able to hold up more upright with decreased radicular symptoms. HEP initiation deferred pending evaluation of delayed reaction to today's treatment, as patient c/o increased pain after initial assessment without treatment.Will plan to initiate HEP at next visit, pending patient reponse.   PT Next Visit Plan HEP instruction, manual therapy including STM as tolerated, postural training, cervical & UE exercises, ?traction, modalities PRN   Consulted and Agree with Plan of Care Patient        Problem List Patient Active Problem List   Diagnosis Date Noted  . 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 Anemia 06/08/2014  . Dysuria 01/07/2014  . RLS (restless legs syndrome) 11/23/2013  . Elevated troponin 11/13/2013  . Chest pain 11/13/2013  . Diabetic peripheral neuropathy 10/29/2013  . Left-sided thoracic back pain 10/06/2013  . Encounter for preventative adult health care exam with abnormal findings 09/14/2013  . Iron deficiency anemia   . Status post laparoscopy 02/25/2013  . GERD (gastroesophageal reflux disease) 01/09/2013  . Amaurosis fugax of left eye 10/16/2012  . Low back pain 06/03/2012  . Vitamin D deficiency 03/11/2012  . Bilateral hand pain 10/20/2011  . Encounter for long-term (current) use of other medications 10/20/2011  . IBS (irritable  bowel syndrome) 08/14/2011  . TIA (transient ischemic attack) 02/10/2011  . Abnormal brain MRI 01/19/2011  . Allergic rhinitis 10/01/2010  . 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 FUNDIC GLAND POLYPS OF STOMACH 03/18/2010  . EPIGASTRIC PAIN chronic 03/18/2010  . ARTHRALGIA 03/17/2009  . SYSTOLIC MURMUR 11/27/1171  . Paroxysmal supraventricular tachycardia 01/12/2009  . PLANTAR FASCIITIS 06/08/2008  . CHEST PAIN 05/18/2008  . Gastroparesis 12/18/2007  . HYPERCHOLESTEROLEMIA 06/11/2007  . Diabetes mellitus type 2 in obese 09/05/2006  . Essential hypertension 09/05/2006    Percival Spanish, PT, MPT 09/16/2014, 12:44 PM  [facility] [address]  [place], [place], [address] Phone: [phone]   Fax:  [phone]

## 2014-09-22 ENCOUNTER — Ambulatory Visit: Payer: Medicare Other | Admitting: Physical Therapy

## 2014-09-22 ENCOUNTER — Telehealth: Payer: Self-pay | Admitting: Family Medicine

## 2014-09-22 DIAGNOSIS — G8929 Other chronic pain: Secondary | ICD-10-CM | POA: Diagnosis not present

## 2014-09-22 DIAGNOSIS — M5010 Cervical disc disorder with radiculopathy, unspecified cervical region: Secondary | ICD-10-CM

## 2014-09-22 DIAGNOSIS — M542 Cervicalgia: Secondary | ICD-10-CM

## 2014-09-22 MED ORDER — GLUCOSE BLOOD VI STRP: ORAL_STRIP | Status: DC

## 2014-09-22 NOTE — Telephone Encounter (Signed)
Patient called back and requested test strips be sent to [facility]

## 2014-09-22 NOTE — Telephone Encounter (Signed)
Caller name: [name]. Relation to pt: self Call back number: [phone] Pharmacy:  Reason for call: Pt came in office since she was at Physical therapy and pt states is needing Test strip for diabetic machine. Pt states does not remember the name of brand, that she will call to give information of brand and who to sent it too or if we have any questions to please call her. Please advise.

## 2014-09-22 NOTE — Therapy (Signed)
[facility] [address]  [place], [place], [address] Phone: [phone]   Fax:  [phone]  Physical Therapy Treatment  Patient Details  Name: [name] MRN: [id number] Date of Birth: 1940/11/20 Referring Provider:  Penni Bombard, MD  Encounter Date: 09/22/2014      PT End of Session - 09/22/14 1414    Visit Number 3   Number of Visits 16   Date for PT Re-Evaluation 11/09/14   PT Start Time 7782   PT Stop Time 1401   PT Time Calculation (min) 46 min   Activity Tolerance Patient tolerated treatment well;Patient limited by pain   Behavior During Therapy [facility] for tasks assessed/performed      Past Medical History  Diagnosis Date  . Gastroparesis   . Pure hypercholesterolemia   . Headache(784.0)   . Unspecified essential hypertension   . Esophageal reflux   . Type II or unspecified type diabetes mellitus without mention of complication, not stated as uncontrolled   . Arthritis     Spinal Osteoarthritis  . Diverticulosis 08/30/2000    Colonoscopy   . Gastric polyp     Fundic Gland  . Anxiety   . Cataract   . Heart murmur     Echocardiogram 2/11: EF 60-65%, mild LAE, grade 1 diastolic dysfunction, aortic valve sclerosis, mean gradient 9 mm of mercury, PASP 34  . Chest pain     Myoview 12/15 no ischemia.  . Carcinoid tumor of stomach   . PSVT (paroxysmal supraventricular tachycardia)   . Leg swelling     bilateral  . Chronic kidney disease     Left kidney smaller than right kidney  . Cancer   . PONV (postoperative nausea and vomiting)     pt states only needs small amount of anesthesia  . Stroke     tia, 2014  . Iron deficiency anemia, unspecified   . Encounter for preventative adult health care exam with abnormal findings 09/14/2013  . Unspecified hereditary and idiopathic peripheral neuropathy 10/29/2013  . Diabetic peripheral neuropathy 10/29/2013  . Diabetes mellitus type 2 in obese  09/05/2006    Qualifier: Diagnosis of  By: Marca Ancona RMA, Lucy    . Anemia 06/08/2014  . 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 Cholecystectomy  1993  . Tonsillectomy    . Colonoscopy  11/11/2010    diverticulosis  . Esophagogastroduodenoscopy  08/29/2010; 09/15/2010    Carcinoid tumor less than 1 cm in July 2012 not seen in August 2012 , gastritis, fundic gland polyps  . Eus  12/15/2010    Procedure: UPPER ENDOSCOPIC ULTRASOUND (EUS) LINEAR;  Surgeon: Owens Loffler, MD;  Location: [facility];  Service: Endoscopy;  Laterality: N/A;  . Esophagogastroduodenoscopy  05/16/2011  . Esophagogastroduodenoscopy  06/14/2012  . Dilatation & currettage/hysteroscopy with resectocope N/A 02/25/2013    Procedure: Attempted hysteroscopy with uterine perforation;  Surgeon: Jamey Reas de Berton Lan, MD;  Location: [facility];  Service: Gynecology;  Laterality: N/A;  . Laparoscopy N/A 02/25/2013    Procedure: Cystoscopy and laparoscopy with fulguration of uterine serosa;  Surgeon: Jamey Reas de Berton Lan, MD;  Location: [facility];  Service: Gynecology;  Laterality: N/A;    There were no vitals filed for this visit.  Visit Diagnosis:  Neck pain of over 3 months duration  Neck pain on left side  Cervical disc disorder  with radiculopathy of cervical region      Subjective Assessment - 09/22/14 1320    Subjective Patient reports sore for the first day after last visit, but then fine. Patient denies radicular numbness/tingling today.   Currently in Pain? Yes   Pain Score 7   avg 5-7/10   Pain Location Neck   Pain Orientation Mid   Pain Descriptors / Indicators Aching                 [facility] Adult PT Treatment/Exercise - 09/22/14 1315    Exercises   Exercises Neck   Neck Exercises: Machines for Strengthening   UBE (Upper Arm Bike) lvl 1 fwd/back 1' each   Neck Exercises: Theraband   Rows 10 reps  yellow TB    Rows Limitations Standing   Shoulder External Rotation 10 reps  yellow TB   Shoulder External Rotation Limitations Hooklying on towel roll   Horizontal ABduction 10 reps  yellow TB   Horizontal ABduction Limitations Hooklying on towel roll   Neck Exercises: Seated   Neck Retraction 10 reps;3 secs   Shoulder Rolls Forwards;Backwards;10 reps   Shoulder Rolls Limitations increased pain with backward roll   Other Seated Exercise Seated row/scapular retraction x10   Other Seated Exercise --   Neck Exercises: Supine   Shoulder Flexion Both;10 reps;Weights   Shoulder Flexion Weights (lbs) 2   Shoulder Flexion Limitations Hooklying on towel pullover   Shoulder ABduction --   Shoulder Abduction Limitations --   Other Supine Exercise --   Other Supine Exercise Chest stretch with towel roll along spine 2x1'   Neck Exercises: Stretches   Upper Trapezius Stretch 20 seconds;2 reps   Upper Trapezius Stretch Limitations seated without added resistance (no pull with hand)   Chest Stretch 60 seconds;2 reps   Chest Stretch Limitations supine on towel roll                PT Education - 09/22/14 1414    Education Details Initial HEP   Person(s) Educated Patient   Methods Explanation;Demonstration;Handout   Comprehension Verbalized understanding;Returned demonstration;Need further instruction          PT Short Term Goals - 09/22/14 1423    PT SHORT TERM GOAL #1   Title Independent with initial HEP (10/12/14)   Status On-going           PT Long Term Goals - 09/22/14 1423    PT LONG TERM GOAL #1   Title Independent with advanced HEP as indicated (11/09/14)   Status On-going   PT LONG TERM GOAL #2   Title Patient will demonstrate increased cervical extension ROM by 20 degrees, and remaining movements by 5 degrees to allow for functional ROM of neck (11/09/14)   Status On-going   PT LONG TERM GOAL #3   Title Patient will tolerate sitting for 30 minutes or greater without increased  pain while watching TV or working on computer (11/09/14)   Status On-going   PT LONG TERM GOAL #4   Title Patient will demonstrate or at least verbalize awareness of neutral neck and shoulder posture (11/09/14)   Status On-going               Plan - 09/22/14 1415    Clinical Impression Statement Improved head/neck posture noted today with decreased flexion although still somewhat forward. Pain intensity essentially unchanged but no c/o radicular symptoms today. Attempted positional stretching with hooklying on 1/2 foam roll but patient unable to tolerate therefore  downgraded to towel roll along spine. Patient tolerating addition of slight resistance/weights with exercises today and able to create an initial HEP.   PT Next Visit Plan HEP review, manual therapy including STM as tolerated, postural training, cervical & UE stretches/exercises, ?traction, modalities PRN   Consulted and Agree with Plan of Care Patient        Problem List Patient Active Problem List   Diagnosis Date Noted  . 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 Anemia 06/08/2014  . Dysuria 01/07/2014  . RLS (restless legs syndrome) 11/23/2013  . Elevated troponin 11/13/2013  . Chest pain 11/13/2013  . Diabetic peripheral neuropathy 10/29/2013  . Left-sided thoracic back pain 10/06/2013  . Encounter for preventative adult health care exam with abnormal findings 09/14/2013  . Iron deficiency anemia   . Status post laparoscopy 02/25/2013  . GERD (gastroesophageal reflux disease) 01/09/2013  . Amaurosis fugax of left eye 10/16/2012  . Low back pain 06/03/2012  . Vitamin D deficiency 03/11/2012  . Bilateral hand pain 10/20/2011  . Encounter for long-term (current) use of other medications 10/20/2011  . IBS (irritable bowel syndrome) 08/14/2011  . TIA (transient ischemic attack) 02/10/2011  . Abnormal brain MRI 01/19/2011  . Allergic rhinitis 10/01/2010  . Carcinoid tumor of stomach- history of  09/29/2010  . FUNDIC GLAND POLYPS OF STOMACH 03/18/2010  . EPIGASTRIC PAIN chronic 03/18/2010  . ARTHRALGIA 03/17/2009  . SYSTOLIC MURMUR 00/86/7619  . Paroxysmal supraventricular tachycardia 01/12/2009  . PLANTAR FASCIITIS 06/08/2008  . CHEST PAIN 05/18/2008  . Gastroparesis 12/18/2007  . HYPERCHOLESTEROLEMIA 06/11/2007  . Diabetes mellitus type 2 in obese 09/05/2006  . Essential hypertension 09/05/2006    Percival Spanish, PT, MPT 09/22/2014, 2:27 PM  [facility] [address]  [place], [place], [address] Phone: [phone]   Fax:  [phone]

## 2014-09-22 NOTE — Telephone Encounter (Signed)
Called left msg. To call back 

## 2014-09-24 ENCOUNTER — Ambulatory Visit: Payer: Medicare Other | Admitting: Physical Therapy

## 2014-09-24 DIAGNOSIS — G8929 Other chronic pain: Principal | ICD-10-CM

## 2014-09-24 DIAGNOSIS — M542 Cervicalgia: Secondary | ICD-10-CM | POA: Diagnosis not present

## 2014-09-24 DIAGNOSIS — M5010 Cervical disc disorder with radiculopathy, unspecified cervical region: Secondary | ICD-10-CM

## 2014-09-24 NOTE — Therapy (Signed)
[facility] [address]  [place], [place], [address] Phone: [phone]   Fax:  [phone]  Physical Therapy Treatment  Patient Details  Name: [name] MRN: [id number] Date of Birth: [birth year] Referring Provider:  Penni Bombard, MD  Encounter Date: 09/24/2014      PT End of Session - 09/24/14 1141    Visit Number 4   Number of Visits 16   Date for PT Re-Evaluation 11/09/14   PT Start Time 1107  Patient arrived late   PT Stop Time 1140   PT Time Calculation (min) 33 min   Activity Tolerance Patient limited by pain   Behavior During Therapy [facility] for tasks assessed/performed      Past Medical History  Diagnosis Date  . Gastroparesis   . Pure hypercholesterolemia   . Headache(784.0)   . Unspecified essential hypertension   . Esophageal reflux   . Type II or unspecified type diabetes mellitus without mention of complication, not stated as uncontrolled   . Arthritis     Spinal Osteoarthritis  . Diverticulosis 08/30/2000    Colonoscopy   . Gastric polyp     Fundic Gland  . Anxiety   . Cataract   . Heart murmur     Echocardiogram 2/11: EF 60-65%, mild LAE, grade 1 diastolic dysfunction, aortic valve sclerosis, mean gradient 9 mm of mercury, PASP 34  . Chest pain     Myoview 12/15 no ischemia.  . Carcinoid tumor of stomach   . PSVT (paroxysmal supraventricular tachycardia)   . Leg swelling     bilateral  . Chronic kidney disease     Left kidney smaller than right kidney  . Cancer   . PONV (postoperative nausea and vomiting)     pt states only needs small amount of anesthesia  . Stroke     tia, 2014  . Iron deficiency anemia, unspecified   . Encounter for preventative adult health care exam with abnormal findings 09/14/2013  . Unspecified hereditary and idiopathic peripheral neuropathy 10/29/2013  . Diabetic peripheral neuropathy 10/29/2013  . Diabetes mellitus type 2 in obese 09/05/2006     Qualifier: Diagnosis of  By: Marca Ancona RMA, Lucy    . Anemia 06/08/2014  . 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 Cholecystectomy  1993  . Tonsillectomy    . Colonoscopy  11/11/2010    diverticulosis  . Esophagogastroduodenoscopy  08/29/2010; 09/15/2010    Carcinoid tumor less than 1 cm in July 2012 not seen in August 2012 , gastritis, fundic gland polyps  . Eus  12/15/2010    Procedure: UPPER ENDOSCOPIC ULTRASOUND (EUS) LINEAR;  Surgeon: Owens Loffler, MD;  Location: [facility];  Service: Endoscopy;  Laterality: N/A;  . Esophagogastroduodenoscopy  05/16/2011  . Esophagogastroduodenoscopy  06/14/2012  . Dilatation & currettage/hysteroscopy with resectocope N/A 02/25/2013    Procedure: Attempted hysteroscopy with uterine perforation;  Surgeon: Jamey Reas de Berton Lan, MD;  Location: [facility];  Service: Gynecology;  Laterality: N/A;  . Laparoscopy N/A 02/25/2013    Procedure: Cystoscopy and laparoscopy with fulguration of uterine serosa;  Surgeon: Jamey Reas de Berton Lan, MD;  Location: [facility];  Service: Gynecology;  Laterality: N/A;    There were no vitals filed for this visit.  Visit Diagnosis:  Neck pain of over 3 months duration  Neck pain on left side  Cervical disc  disorder with radiculopathy of cervical region      Subjective Assessment - 09/24/14 1110    Subjective Patient reports very sore at base of neck after last treatment visit, but better yesterday. Reporting intermittent numbness/tingling down left arm to fingertips. States has not attempted HEP yet.   Currently in Pain? Yes   Pain Score 6    Pain Location Neck   Pain Orientation Mid   Pain Descriptors / Indicators Stabbing   Pain Radiating Towards Intermittent numbess/tingling down left UE to fingertips                   [facility] Adult PT Treatment/Exercise - 09/24/14 1106    Exercises   Exercises Neck   Neck  Exercises: Theraband   Rows 10 reps  yellow TB   Rows Limitations Standing   Neck Exercises: Seated   Neck Retraction 10 reps;3 secs   Shoulder Rolls Forwards;Backwards;10 reps   Other Seated Exercise Seated row/scapular retraction x10   Neck Exercises: Supine   Shoulder Flexion Both;10 reps;Weights   Shoulder Flexion Weights (lbs) 2   Shoulder Flexion Limitations Hooklying on towel pullover   Shoulder ABduction Both;10 reps   Shoulder Abduction Weights (lbs) 0   Shoulder Abduction Limitations Horizontal Abduction - performed w/o TB secondary to increased pain afrer last visit   Other Supine Exercise Bilateral shoulder ER x10 - performed w/o TB secondary to increased pain after last visit   Other Supine Exercise --   Manual Therapy   Manual Therapy Soft tissue mobilization;Manual Traction   Soft tissue mobilization Gentle SOC and upper trap   Manual Traction Gentle cervical distraction   Neck Exercises: Stretches   Corner Stretch 10 seconds;3 reps   Chest Stretch 60 seconds;2 reps   Chest Stretch Limitations supine on towel roll                  PT Short Term Goals - 09/22/14 1423    PT SHORT TERM GOAL #1   Title Independent with initial HEP (10/12/14)   Status On-going           PT Long Term Goals - 09/22/14 1423    PT LONG TERM GOAL #1   Title Independent with advanced HEP as indicated (11/09/14)   Status On-going   PT LONG TERM GOAL #2   Title Patient will demonstrate increased cervical extension ROM by 20 degrees, and remaining movements by 5 degrees to allow for functional ROM of neck (11/09/14)   Status On-going   PT LONG TERM GOAL #3   Title Patient will tolerate sitting for 30 minutes or greater without increased pain while watching TV or working on computer (11/09/14)   Status On-going   PT LONG TERM GOAL #4   Title Patient will demonstrate or at least verbalize awareness of neutral neck and shoulder posture (11/09/14)   Status On-going                Plan - 09/24/14 1149    Clinical Impression Statement Patent with increased forward flexed head posture again today and more guarded during exercises and manual therapy despite reports of less pain. No pregression of exercises today and reduced therapy intensity accordingly today, foregoing TB with supine horiz abduction and ER.   PT Next Visit Plan HEP review, manual therapy including STM as tolerated, postural training, cervical & UE stretches/exercises, ?traction (manual and progress to mechanical if able), modalities PRN   Consulted and Agree with Plan of Care Patient  Problem List Patient Active Problem List   Diagnosis Date Noted  . 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 Anemia 06/08/2014  . Dysuria 01/07/2014  . RLS (restless legs syndrome) 11/23/2013  . Elevated troponin 11/13/2013  . Chest pain 11/13/2013  . Diabetic peripheral neuropathy 10/29/2013  . Left-sided thoracic back pain 10/06/2013  . Encounter for preventative adult health care exam with abnormal findings 09/14/2013  . Iron deficiency anemia   . Status post laparoscopy 02/25/2013  . GERD (gastroesophageal reflux disease) 01/09/2013  . Amaurosis fugax of left eye 10/16/2012  . Low back pain 06/03/2012  . Vitamin D deficiency 03/11/2012  . Bilateral hand pain 10/20/2011  . Encounter for long-term (current) use of other medications 10/20/2011  . IBS (irritable bowel syndrome) 08/14/2011  . TIA (transient ischemic attack) 02/10/2011  . Abnormal brain MRI 01/19/2011  . Allergic rhinitis 10/01/2010  . 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 FUNDIC GLAND POLYPS OF STOMACH 03/18/2010  . EPIGASTRIC PAIN chronic 03/18/2010  . ARTHRALGIA 03/17/2009  . SYSTOLIC MURMUR 38/32/9191  . Paroxysmal supraventricular tachycardia 01/12/2009  . PLANTAR FASCIITIS 06/08/2008  . CHEST PAIN 05/18/2008  . Gastroparesis 12/18/2007  . HYPERCHOLESTEROLEMIA 06/11/2007  .  Diabetes mellitus type 2 in obese 09/05/2006  . Essential hypertension 09/05/2006    Percival Spanish, PT, MPT 09/24/2014, 11:55 AM  [facility] [address]  [place], [place], [address] Phone: [phone]   Fax:  [phone]

## 2014-09-25 NOTE — Telephone Encounter (Signed)
Pt states the correct fax number is [phone] and their phone number is [phone]

## 2014-09-25 NOTE — Telephone Encounter (Signed)
Pt upset because [facility] told her they have not received RX for test strips. Please resend asap. Pt is out of strips!!!

## 2014-09-28 ENCOUNTER — Other Ambulatory Visit: Payer: Self-pay | Admitting: Cardiology

## 2014-09-28 ENCOUNTER — Ambulatory Visit: Payer: Medicare Other | Admitting: Physical Therapy

## 2014-09-28 DIAGNOSIS — M542 Cervicalgia: Secondary | ICD-10-CM | POA: Diagnosis not present

## 2014-09-28 DIAGNOSIS — I6523 Occlusion and stenosis of bilateral carotid arteries: Secondary | ICD-10-CM

## 2014-09-28 DIAGNOSIS — G8929 Other chronic pain: Secondary | ICD-10-CM | POA: Diagnosis not present

## 2014-09-28 DIAGNOSIS — M5010 Cervical disc disorder with radiculopathy, unspecified cervical region: Secondary | ICD-10-CM

## 2014-09-28 MED ORDER — GLUCOSE BLOOD VI STRP: ORAL_STRIP | Status: DC

## 2014-09-28 NOTE — Therapy (Signed)
[facility] [address]  [place], [place], [address] Phone: [phone]   Fax:  [phone]  Physical Therapy Treatment  Patient Details  Name: [name] MRN: [id number] Date of Birth: 1940-04-12 Referring Provider:  Penni Bombard, MD  Encounter Date: 09/28/2014      PT End of Session - 09/28/14 1201    Visit Number 5   Number of Visits 16   Date for PT Re-Evaluation 11/09/14   PT Start Time 1104   PT Stop Time 1144   PT Time Calculation (min) 40 min   Activity Tolerance Patient tolerated treatment well   Behavior During Therapy [facility] for tasks assessed/performed      Past Medical History  Diagnosis Date  . Gastroparesis   . Pure hypercholesterolemia   . Headache(784.0)   . Unspecified essential hypertension   . Esophageal reflux   . Type II or unspecified type diabetes mellitus without mention of complication, not stated as uncontrolled   . Arthritis     Spinal Osteoarthritis  . Diverticulosis 08/30/2000    Colonoscopy   . Gastric polyp     Fundic Gland  . Anxiety   . Cataract   . Heart murmur     Echocardiogram 2/11: EF 60-65%, mild LAE, grade 1 diastolic dysfunction, aortic valve sclerosis, mean gradient 9 mm of mercury, PASP 34  . Chest pain     Myoview 12/15 no ischemia.  . Carcinoid tumor of stomach   . PSVT (paroxysmal supraventricular tachycardia)   . Leg swelling     bilateral  . Chronic kidney disease     Left kidney smaller than right kidney  . Cancer   . PONV (postoperative nausea and vomiting)     pt states only needs small amount of anesthesia  . Stroke     tia, 2014  . Iron deficiency anemia, unspecified   . Encounter for preventative adult health care exam with abnormal findings 09/14/2013  . Unspecified hereditary and idiopathic peripheral neuropathy 10/29/2013  . Diabetic peripheral neuropathy 10/29/2013  . Diabetes mellitus type 2 in obese 09/05/2006    Qualifier:  Diagnosis of  By: Marca Ancona RMA, Lucy    . Anemia 06/08/2014  . 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 Cholecystectomy  1993  . Tonsillectomy    . Colonoscopy  11/11/2010    diverticulosis  . Esophagogastroduodenoscopy  08/29/2010; 09/15/2010    Carcinoid tumor less than 1 cm in July 2012 not seen in August 2012 , gastritis, fundic gland polyps  . Eus  12/15/2010    Procedure: UPPER ENDOSCOPIC ULTRASOUND (EUS) LINEAR;  Surgeon: Owens Loffler, MD;  Location: [facility];  Service: Endoscopy;  Laterality: N/A;  . Esophagogastroduodenoscopy  05/16/2011  . Esophagogastroduodenoscopy  06/14/2012  . Dilatation & currettage/hysteroscopy with resectocope N/A 02/25/2013    Procedure: Attempted hysteroscopy with uterine perforation;  Surgeon: Jamey Reas de Berton Lan, MD;  Location: [facility];  Service: Gynecology;  Laterality: N/A;  . Laparoscopy N/A 02/25/2013    Procedure: Cystoscopy and laparoscopy with fulguration of uterine serosa;  Surgeon: Jamey Reas de Berton Lan, MD;  Location: [facility];  Service: Gynecology;  Laterality: N/A;    There were no vitals filed for this visit.  Visit Diagnosis:  Neck pain of over 3 months duration  Neck pain on left side  Cervical disc disorder with radiculopathy of  cervical region      Subjective Assessment - 09/28/14 1107    Subjective Patient reports pain has been less over past few days.   Pain Score 6   Avg 6/10   Pain Location Neck   Pain Orientation Lower;Medial   Pain Descriptors / Indicators Stabbing            [facility] PT Assessment - 09/28/14 1104    Assessment   Next MD Visit ~3 mo   ROM / Strength   AROM / PROM / Strength AROM   AROM   AROM Assessment Site Cervical   Cervical Flexion 41   Cervical Extension 28   Cervical - Right Side Bend 28   Cervical - Left Side Bend 28   Cervical - Right Rotation 39   Cervical - Left Rotation 36                  [facility] Adult PT Treatment/Exercise - 09/28/14 1104    Exercises   Exercises Neck   Neck Exercises: Machines for Strengthening   UBE (Upper Arm Bike) lvl 1 fwd/back 1.5' each   Neck Exercises: Theraband   Shoulder Extension 15 reps  Yellow TB   Shoulder Extension Limitations Standing   Rows 10 reps  yellow TB   Rows Limitations Standing   Shoulder External Rotation 10 reps  yellow TB   Shoulder External Rotation Limitations Hooklying on towel roll   Horizontal ABduction 10 reps  yellow TB   Horizontal ABduction Limitations Hooklying on towel roll   Neck Exercises: Seated   Neck Retraction 10 reps;3 secs   Neck Exercises: Supine   X to V 10 reps   X to V Limitations Hooklying on towel roll   Shoulder Flexion Both;10 reps;Weights   Shoulder Flexion Weights (lbs) 2   Shoulder Flexion Limitations Hooklying on towel; pullover   Shoulder ABduction Both;10 reps   Shoulder Abduction Weights (lbs) 1   Shoulder Abduction Limitations Hooklying on towel roll   Other Supine Exercise --   Manual Therapy   Manual Therapy Soft tissue mobilization;Manual Traction;Joint mobilization   Joint Mobilization Caudal glide 1st rib   Soft tissue mobilization Gentle SOC, levator, upper trap & SCM   Manual Traction Gentle cervical distraction   Neck Exercises: Stretches   Upper Trapezius Stretch 20 seconds;3 reps   Upper Trapezius Stretch Limitations manually   Levator Stretch 20 seconds;3 reps   Levator Stretch Limitations manually   Chest Stretch 60 seconds;2 reps   Chest Stretch Limitations supine on towel roll                  PT Short Term Goals - 09/28/14 1205    PT SHORT TERM GOAL #1   Title Independent with initial HEP (10/12/14)   Status On-going           PT Long Term Goals - 09/28/14 1205    PT LONG TERM GOAL #1   Title Independent with advanced HEP as indicated (11/09/14)   Status On-going   PT LONG TERM GOAL #2   Title Patient will  demonstrate increased cervical extension ROM by 20 degrees, and remaining movements by 5 degrees to allow for functional ROM of neck (11/09/14)   Status On-going   PT LONG TERM GOAL #3   Title Patient will tolerate sitting for 30 minutes or greater without increased pain while watching TV or working on computer (11/09/14)   Status On-going   PT LONG TERM GOAL #4  Title Patient will demonstrate or at least verbalize awareness of neutral neck and shoulder posture (11/09/14)   Status On-going               Plan - 09/28/14 1201    Clinical Impression Statement Patient reporting less pain and demonstrating less pain behaviors, however no change in pain rating on pain scale. Cervical ROM improving in all planes except limited change in R/L rotation. Improving posture with increasing tolerance for extension in supine with decreasing need for pillows. Resumed resistance with exercises with slight progression of exercises with good patient tolerance.   PT Next Visit Plan manual therapy including STM as tolerated, postural training, cervical & UE stretches/exercises, ?traction (manual and progress to mechanical if able), modalities PRN   Consulted and Agree with Plan of Care Patient        Problem List Patient Active Problem List   Diagnosis Date Noted  . 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 Anemia 06/08/2014  . Dysuria 01/07/2014  . RLS (restless legs syndrome) 11/23/2013  . Elevated troponin 11/13/2013  . Chest pain 11/13/2013  . Diabetic peripheral neuropathy 10/29/2013  . Left-sided thoracic back pain 10/06/2013  . Encounter for preventative adult health care exam with abnormal findings 09/14/2013  . Iron deficiency anemia   . Status post laparoscopy 02/25/2013  . GERD (gastroesophageal reflux disease) 01/09/2013  . Amaurosis fugax of left eye 10/16/2012  . Low back pain 06/03/2012  . Vitamin D deficiency 03/11/2012  . Bilateral hand pain 10/20/2011  .  Encounter for long-term (current) use of other medications 10/20/2011  . IBS (irritable bowel syndrome) 08/14/2011  . TIA (transient ischemic attack) 02/10/2011  . Abnormal brain MRI 01/19/2011  . Allergic rhinitis 10/01/2010  . 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 FUNDIC GLAND POLYPS OF STOMACH 03/18/2010  . EPIGASTRIC PAIN chronic 03/18/2010  . ARTHRALGIA 03/17/2009  . SYSTOLIC MURMUR 88/28/0034  . Paroxysmal supraventricular tachycardia 01/12/2009  . PLANTAR FASCIITIS 06/08/2008  . CHEST PAIN 05/18/2008  . Gastroparesis 12/18/2007  . HYPERCHOLESTEROLEMIA 06/11/2007  . Diabetes mellitus type 2 in obese 09/05/2006  . Essential hypertension 09/05/2006    Percival Spanish, PT, MPT 09/28/2014, 12:09 PM  [facility] [address]  [place], [place], [address] Phone: [phone]   Fax:  [phone]

## 2014-09-28 NOTE — Telephone Encounter (Signed)
Printed prescription and is on counter for signature by PCP.  Once signed will fax to number below.  Called the patient to inform will send in to fax number given.

## 2014-09-28 NOTE — Addendum Note (Signed)
Addended by: Sharon Seller B on: 09/28/2014 08:28 AM   Modules accepted: Orders

## 2014-09-29 NOTE — Telephone Encounter (Signed)
Patient called this morning stating that [facility] did not receive the refill faxed in yesterday.  I did inform her I would call [facility] and verbally give refill. I then called [facility] at [phone] and they cannot take orders/refills over the phone, must be faxed or electronically sent.  They also informed it takes 24 hours to process and had not received the fax in their system at that time. I did refax hardcopy to number customer service representative gave me [phone] and also refaxed at other number [phone].  Have faxed prescription a total of 3 times and received confirmation faxes that fax received all 3 times.  I then called the patient back to inform her of total process and that it does take a total of 24 hours after fax was sent from start to finish before they see the order in their system and to call them later today and that hopefully the order will be there if not to call us back.    Fax from 09/28/14 received on their end at 9:09am, 09/29/14 fax received on their end at 10:31AM, 09/29/14 fax (to alternative fax number) received on their end at 10:37am.

## 2014-09-30 NOTE — Telephone Encounter (Signed)
Please resend orders to Ed at [facility] to [phone] to a paper fax machine.

## 2014-10-01 ENCOUNTER — Ambulatory Visit: Payer: Medicare Other | Admitting: Physical Therapy

## 2014-10-01 DIAGNOSIS — G8929 Other chronic pain: Secondary | ICD-10-CM | POA: Diagnosis not present

## 2014-10-01 DIAGNOSIS — M542 Cervicalgia: Secondary | ICD-10-CM

## 2014-10-01 DIAGNOSIS — M5010 Cervical disc disorder with radiculopathy, unspecified cervical region: Secondary | ICD-10-CM | POA: Diagnosis not present

## 2014-10-01 NOTE — Therapy (Signed)
[facility] [address]  [place], [place], [address] Phone: [phone]   Fax:  [phone]  Physical Therapy Treatment  Patient Details  Name: [name] MRN: [id number] Date of Birth: 08-15-1940 Referring Provider:  Penni Bombard, MD  Encounter Date: 10/01/2014      PT End of Session - 10/01/14 1152    Visit Number 6   Number of Visits 16   Date for PT Re-Evaluation 11/09/14   PT Start Time 1105   PT Stop Time 1145   PT Time Calculation (min) 40 min   Activity Tolerance Patient tolerated treatment well;Patient limited by pain   Behavior During Therapy [facility] for tasks assessed/performed      Past Medical History  Diagnosis Date  . Gastroparesis   . Pure hypercholesterolemia   . Headache(784.0)   . Unspecified essential hypertension   . Esophageal reflux   . Type II or unspecified type diabetes mellitus without mention of complication, not stated as uncontrolled   . Arthritis     Spinal Osteoarthritis  . Diverticulosis 08/30/2000    Colonoscopy   . Gastric polyp     Fundic Gland  . Anxiety   . Cataract   . Heart murmur     Echocardiogram 2/11: EF 60-65%, mild LAE, grade 1 diastolic dysfunction, aortic valve sclerosis, mean gradient 9 mm of mercury, PASP 34  . Chest pain     Myoview 12/15 no ischemia.  . Carcinoid tumor of stomach   . PSVT (paroxysmal supraventricular tachycardia)   . Leg swelling     bilateral  . Chronic kidney disease     Left kidney smaller than right kidney  . Cancer   . PONV (postoperative nausea and vomiting)     pt states only needs small amount of anesthesia  . Stroke     tia, 2014  . Iron deficiency anemia, unspecified   . Encounter for preventative adult health care exam with abnormal findings 09/14/2013  . Unspecified hereditary and idiopathic peripheral neuropathy 10/29/2013  . Diabetic peripheral neuropathy 10/29/2013  . Diabetes mellitus type 2 in obese  09/05/2006    Qualifier: Diagnosis of  By: Marca Ancona RMA, Lucy    . Anemia 06/08/2014  . 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 Cholecystectomy  1993  . Tonsillectomy    . Colonoscopy  11/11/2010    diverticulosis  . Esophagogastroduodenoscopy  08/29/2010; 09/15/2010    Carcinoid tumor less than 1 cm in July 2012 not seen in August 2012 , gastritis, fundic gland polyps  . Eus  12/15/2010    Procedure: UPPER ENDOSCOPIC ULTRASOUND (EUS) LINEAR;  Surgeon: Owens Loffler, MD;  Location: [facility];  Service: Endoscopy;  Laterality: N/A;  . Esophagogastroduodenoscopy  05/16/2011  . Esophagogastroduodenoscopy  06/14/2012  . Dilatation & currettage/hysteroscopy with resectocope N/A 02/25/2013    Procedure: Attempted hysteroscopy with uterine perforation;  Surgeon: Jamey Reas de Berton Lan, MD;  Location: [name];  Service: Gynecology;  Laterality: N/A;  . Laparoscopy N/A 02/25/2013    Procedure: Cystoscopy and laparoscopy with fulguration of uterine serosa;  Surgeon: Jamey Reas de Berton Lan, MD;  Location: [facility];  Service: Gynecology;  Laterality: N/A;    There were no vitals filed for this visit.  Visit Diagnosis:  Neck pain of over 3 months duration  Neck pain on left side  Cervical disc disorder  with radiculopathy of cervical region      Subjective Assessment - 10/01/14 1124    Subjective Patient reporting increased pain without known trigger today. Does not think increased pain related to last therapy visit. Reports less numbess/tingling in arm recently.   Currently in Pain? Yes   Pain Score 8    Pain Location Neck  and thoracic spine   Pain Orientation Medial                [facility] Adult PT Treatment/Exercise - 10/01/14 1105    Exercises   Exercises Neck   Neck Exercises: Theraband   Shoulder External Rotation 10 reps  yellow TB   Shoulder External Rotation Limitations Hooklying on towel  roll   Horizontal ABduction 10 reps  yellow TB   Horizontal ABduction Limitations Hooklying on towel roll   Neck Exercises: Supine   Shoulder Flexion Both;10 reps;Weights   Shoulder Flexion Weights (lbs) 2   Shoulder Flexion Limitations Hooklying on towel; pullover   Shoulder ABduction Both;10 reps   Shoulder Abduction Weights (lbs) 1   Shoulder Abduction Limitations Alternating, hooklying on towel roll   Manual Therapy   Manual Therapy Soft tissue mobilization   Soft tissue mobilization Gentle SOC, levator, upper trap & SCM in supine; upper/mid trap and paraspinals in sitting leaning on hi/low table   Manual Traction Gentle cervical distraction   Neck Exercises: Stretches   Upper Trapezius Stretch 20 seconds;3 reps   Upper Trapezius Stretch Limitations manually   Levator Stretch 20 seconds;3 reps   Levator Stretch Limitations manually   Corner Stretch 20 seconds;3 reps   Chest Stretch 60 seconds;2 reps   Chest Stretch Limitations supine on towel roll                  PT Short Term Goals - 09/28/14 1205    PT SHORT TERM GOAL #1   Title Independent with initial HEP (10/12/14)   Status On-going           PT Long Term Goals - 09/28/14 1205    PT LONG TERM GOAL #1   Title Independent with advanced HEP as indicated (11/09/14)   Status On-going   PT LONG TERM GOAL #2   Title Patient will demonstrate increased cervical extension ROM by 20 degrees, and remaining movements by 5 degrees to allow for functional ROM of neck (11/09/14)   Status On-going   PT LONG TERM GOAL #3   Title Patient will tolerate sitting for 30 minutes or greater without increased pain while watching TV or working on computer (11/09/14)   Status On-going   PT LONG TERM GOAL #4   Title Patient will demonstrate or at least verbalize awareness of neutral neck and shoulder posture (11/09/14)   Status On-going               Plan - 10/01/14 1153    Clinical Impression Statement Pain intensity  increased today with complaints of pain al the way down her spine without known trigger, but patient does not feel it was related to last treatment visit. Increased emphasis on STM today secondary to increased pain with patient reporting decreased pain afterward, although noted muscle tension not terribly increased. Improvement noted with decrease in frequency and intensity of radicular numbness and tingling.   PT Next Visit Plan manual therapy including STM as tolerated, postural training, cervical & UE stretches/exercises with addition of isometrics, ?traction (manual and progress to mechanical if able), modalities PRN   Consulted and  Agree with Plan of Care Patient        Problem List Patient Active Problem List   Diagnosis Date Noted  . 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 Anemia 06/08/2014  . Dysuria 01/07/2014  . RLS (restless legs syndrome) 11/23/2013  . Elevated troponin 11/13/2013  . Chest pain 11/13/2013  . Diabetic peripheral neuropathy 10/29/2013  . Left-sided thoracic back pain 10/06/2013  . Encounter for preventative adult health care exam with abnormal findings 09/14/2013  . Iron deficiency anemia   . Status post laparoscopy 02/25/2013  . GERD (gastroesophageal reflux disease) 01/09/2013  . Amaurosis fugax of left eye 10/16/2012  . Low back pain 06/03/2012  . Vitamin D deficiency 03/11/2012  . Bilateral hand pain 10/20/2011  . Encounter for long-term (current) use of other medications 10/20/2011  . IBS (irritable bowel syndrome) 08/14/2011  . TIA (transient ischemic attack) 02/10/2011  . Abnormal brain MRI 01/19/2011  . Allergic rhinitis 10/01/2010  . 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 FUNDIC GLAND POLYPS OF STOMACH 03/18/2010  . EPIGASTRIC PAIN chronic 03/18/2010  . ARTHRALGIA 03/17/2009  . SYSTOLIC MURMUR 18/59/0931  . Paroxysmal supraventricular tachycardia 01/12/2009  . PLANTAR FASCIITIS 06/08/2008  . CHEST PAIN  05/18/2008  . Gastroparesis 12/18/2007  . HYPERCHOLESTEROLEMIA 06/11/2007  . Diabetes mellitus type 2 in obese 09/05/2006  . Essential hypertension 09/05/2006    Percival Spanish, PT, MPT 10/01/2014, 12:14 PM  [facility] [address]  [place], [place], [address] Phone: [phone]   Fax:  [phone]

## 2014-10-01 NOTE — Telephone Encounter (Signed)
Faxed prescription for test strips to [facility] at [phone] at 7:29 AM on 10/01/14.  Received confirmation fax received.

## 2014-10-02 ENCOUNTER — Ambulatory Visit (HOSPITAL_COMMUNITY)
Admission: RE | Admit: 2014-10-02 | Discharge: 2014-10-02 | Disposition: A | Discharge status: Home / Self Care | Payer: Medicare Other | Source: Ambulatory Visit | Attending: Cardiology | Admitting: Cardiology

## 2014-10-02 DIAGNOSIS — I6523 Occlusion and stenosis of bilateral carotid arteries: Secondary | ICD-10-CM

## 2014-10-06 ENCOUNTER — Telehealth: Payer: Self-pay | Admitting: *Deleted

## 2014-10-06 ENCOUNTER — Ambulatory Visit: Payer: Medicare Other | Admitting: Neurology

## 2014-10-06 NOTE — Telephone Encounter (Signed)
Dr Carlean Purl: pt is scheduled for EGD Monday 10/26/14.  Last OV 05/21/14 says that you will see her back in office in 6 months and consider need for EGD.  Do not see current recall assessment for EGD.  Does pt need OV instead of EGD or okay for direct EGD now?  Thanks, Juliann Pulse

## 2014-10-06 NOTE — Telephone Encounter (Signed)
OK to do it  Recall is in my stack and not signed off yet

## 2014-10-06 NOTE — Telephone Encounter (Signed)
noted 

## 2014-10-07 ENCOUNTER — Ambulatory Visit: Payer: Medicare Other | Admitting: Physical Therapy

## 2014-10-07 DIAGNOSIS — G8929 Other chronic pain: Principal | ICD-10-CM

## 2014-10-07 DIAGNOSIS — M542 Cervicalgia: Secondary | ICD-10-CM

## 2014-10-07 DIAGNOSIS — M5010 Cervical disc disorder with radiculopathy, unspecified cervical region: Secondary | ICD-10-CM | POA: Diagnosis not present

## 2014-10-07 NOTE — Therapy (Signed)
[facility] [address]  [place], [place], [address] Phone: [phone]   Fax:  [phone]  Physical Therapy Treatment  Patient Details  Name: [name] MRN: [id number] Date of Birth: 1940-02-26 Referring Provider:  Penni Bombard, MD  Encounter Date: 10/07/2014      PT End of Session - 10/07/14 1157    Visit Number 7   Number of Visits 16   Date for PT Re-Evaluation 11/09/14   PT Start Time 1150   PT Stop Time 1229   PT Time Calculation (min) 39 min   Activity Tolerance Patient tolerated treatment well   Behavior During Therapy [facility] for tasks assessed/performed      Past Medical History  Diagnosis Date  . Gastroparesis   . Pure hypercholesterolemia   . Headache(784.0)   . Unspecified essential hypertension   . Esophageal reflux   . Type II or unspecified type diabetes mellitus without mention of complication, not stated as uncontrolled   . Arthritis     Spinal Osteoarthritis  . Diverticulosis 08/30/2000    Colonoscopy   . Gastric polyp     Fundic Gland  . Anxiety   . Cataract   . Heart murmur     Echocardiogram 2/11: EF 60-65%, mild LAE, grade 1 diastolic dysfunction, aortic valve sclerosis, mean gradient 9 mm of mercury, PASP 34  . Chest pain     Myoview 12/15 no ischemia.  . Carcinoid tumor of stomach   . PSVT (paroxysmal supraventricular tachycardia)   . Leg swelling     bilateral  . Chronic kidney disease     Left kidney smaller than right kidney  . Cancer   . PONV (postoperative nausea and vomiting)     pt states only needs small amount of anesthesia  . Stroke     tia, 2014  . Iron deficiency anemia, unspecified   . Encounter for preventative adult health care exam with abnormal findings 09/14/2013  . Unspecified hereditary and idiopathic peripheral neuropathy 10/29/2013  . Diabetic peripheral neuropathy 10/29/2013  . Diabetes mellitus type 2 in obese 09/05/2006    Qualifier:  Diagnosis of  By: Marca Ancona RMA, Lucy    . Anemia 06/08/2014  . 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 Cholecystectomy  1993  . Tonsillectomy    . Colonoscopy  11/11/2010    diverticulosis  . Esophagogastroduodenoscopy  08/29/2010; 09/15/2010    Carcinoid tumor less than 1 cm in July 2012 not seen in August 2012 , gastritis, fundic gland polyps  . Eus  12/15/2010    Procedure: UPPER ENDOSCOPIC ULTRASOUND (EUS) LINEAR;  Surgeon: Owens Loffler, MD;  Location: [facility];  Service: Endoscopy;  Laterality: N/A;  . Esophagogastroduodenoscopy  05/16/2011  . Esophagogastroduodenoscopy  06/14/2012  . Dilatation & currettage/hysteroscopy with resectocope N/A 02/25/2013    Procedure: Attempted hysteroscopy with uterine perforation;  Surgeon: Jamey Reas de Berton Lan, MD;  Location: [facility];  Service: Gynecology;  Laterality: N/A;  . Laparoscopy N/A 02/25/2013    Procedure: Cystoscopy and laparoscopy with fulguration of uterine serosa;  Surgeon: Jamey Reas de Berton Lan, MD;  Location: [facility];  Service: Gynecology;  Laterality: N/A;    There were no vitals filed for this visit.  Visit Diagnosis:  Neck pain of over 3 months duration  Neck pain on left side  Cervical disc disorder with radiculopathy of  cervical region      Subjective Assessment - 10/07/14 1152    Subjective Patient reports pain was very bad yesterday (9/10) most likely secondary to positioning necessary to complete carotid doppler study, but better today.   Currently in Pain? Yes   Pain Score 5    Pain Location Neck                 [facility] Adult PT Treatment/Exercise - 10/07/14 1150    Exercises   Exercises Neck   Neck Exercises: Machines for Strengthening   Cybex Row Low Row 10# x10   Neck Exercises: Theraband   Shoulder Extension 10 reps;Red   Shoulder Extension Limitations Standing   Rows 10 reps;Red   Rows Limitations Standing    Shoulder External Rotation 10 reps;Red  yellow TB   Shoulder External Rotation Limitations Hooklying on 1/2 foam roll padded with folded towel   Horizontal ABduction 10 reps;Red   Horizontal ABduction Limitations Hooklying on 1/2 foam roll padded with folded towel   Neck Exercises: Supine   Neck Retraction 10 reps;3 secs   X to V 10 reps   X to V Limitations Hooklying on 1/2 foam roll padded with folded towel   Shoulder Flexion Both;10 reps;Weights  2 sets - 2# x10, 3# x10   Shoulder Flexion Weights (lbs) 2,3   Shoulder Flexion Limitations Hooklying on 1/2 foam roll padded with folded towel; pullover   Shoulder ABduction Both;10 reps   Shoulder Abduction Weights (lbs) 1   Shoulder Abduction Limitations Alternating, hooklying on 1/2 foam roll padded with folded towel   Other Supine Exercise Cervical isometrics - L/R SB & L/R Rot, 5x5" each   Manual Therapy   Manual Therapy Soft tissue mobilization;Manual Traction   Soft tissue mobilization Gentle SOC, levator, upper trap & SCM in supine; upper/mid trap and paraspinals in sitting leaning on hi/low table   Manual Traction Gentle cervical distraction   Neck Exercises: Stretches   Upper Trapezius Stretch 20 seconds;3 reps   Upper Trapezius Stretch Limitations manually   Levator Stretch 20 seconds;3 reps   Levator Stretch Limitations manually   Corner Stretch 20 seconds;3 reps   Chest Stretch 60 seconds   Chest Stretch Limitations supine on 1/2 foam roll padded with folded towel                  PT Short Term Goals - 10/07/14 1234    PT SHORT TERM GOAL #1   Title Independent with initial HEP (10/12/14)   Status Achieved           PT Long Term Goals - 10/07/14 1235    PT LONG TERM GOAL #1   Title Independent with advanced HEP as indicated (11/09/14)   Status On-going   PT LONG TERM GOAL #2   Title Patient will demonstrate increased cervical extension ROM by 20 degrees, and remaining movements by 5 degrees to allow  for functional ROM of neck (11/09/14)   Status On-going   PT LONG TERM GOAL #3   Title Patient will tolerate sitting for 30 minutes or greater without increased pain while watching TV or working on computer (11/09/14)   Status On-going   PT LONG TERM GOAL #4   Title Patient will demonstrate or at least verbalize awareness of neutral neck and shoulder posture (11/09/14)   Status On-going               Plan - 10/07/14 1230    Clinical Impression Statement Patient's  pain reports overall improving excluding isolated incidences such as yesterday after positioning during carotid dopplers. Able to progress to hooklying on 1/2 foam roll (with folded towel for padding) during supine exercises. Also tolerating progression of resistance (TB & weights) during exercises along with addition of cerivcal isometrics. Patient c/o increased pain in left levator during PROM and stretching, but otherwise no c/o pain during exercises or manual therapy.        Problem List Patient Active Problem List   Diagnosis Date Noted  . 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 Anemia 06/08/2014  . Dysuria 01/07/2014  . RLS (restless legs syndrome) 11/23/2013  . Elevated troponin 11/13/2013  . Chest pain 11/13/2013  . Diabetic peripheral neuropathy 10/29/2013  . Left-sided thoracic back pain 10/06/2013  . Encounter for preventative adult health care exam with abnormal findings 09/14/2013  . Iron deficiency anemia   . Status post laparoscopy 02/25/2013  . GERD (gastroesophageal reflux disease) 01/09/2013  . Amaurosis fugax of left eye 10/16/2012  . Low back pain 06/03/2012  . Vitamin D deficiency 03/11/2012  . Bilateral hand pain 10/20/2011  . Encounter for long-term (current) use of other medications 10/20/2011  . IBS (irritable bowel syndrome) 08/14/2011  . TIA (transient ischemic attack) 02/10/2011  . Abnormal brain MRI 01/19/2011  . Allergic rhinitis 10/01/2010  . Carcinoid tumor of  stomach- history of 09/29/2010  . FUNDIC GLAND POLYPS OF STOMACH 03/18/2010  . EPIGASTRIC PAIN chronic 03/18/2010  . ARTHRALGIA 03/17/2009  . SYSTOLIC MURMUR 89/38/1017  . Paroxysmal supraventricular tachycardia 01/12/2009  . PLANTAR FASCIITIS 06/08/2008  . CHEST PAIN 05/18/2008  . Gastroparesis 12/18/2007  . HYPERCHOLESTEROLEMIA 06/11/2007  . Diabetes mellitus type 2 in obese 09/05/2006  . Essential hypertension 09/05/2006    Percival Spanish, PT, MPT 10/07/2014, 12:37 PM  [facility] [address]  [place], [place], [address] Phone: [phone]   Fax:  [phone]

## 2014-10-08 ENCOUNTER — Other Ambulatory Visit: Payer: Self-pay | Admitting: Family Medicine

## 2014-10-08 ENCOUNTER — Ambulatory Visit (AMBULATORY_SURGERY_CENTER): Payer: Self-pay | Admitting: *Deleted

## 2014-10-08 VITALS — Ht 61.0 in | Wt 176.0 lb

## 2014-10-08 DIAGNOSIS — D3A092 Benign carcinoid tumor of the stomach: Secondary | ICD-10-CM

## 2014-10-08 DIAGNOSIS — D131 Benign neoplasm of stomach: Secondary | ICD-10-CM

## 2014-10-08 MED ORDER — LANCETS MISC: Status: DC

## 2014-10-08 NOTE — Progress Notes (Signed)
Denies allergies to eggs or soy products. Denies complications with sedation or anesthesia. Denies O2 use. Denies use of diet or weight loss medications.    

## 2014-10-08 NOTE — Telephone Encounter (Signed)
Printed and faxed lancets to [phone] as requested by patient.

## 2014-10-09 ENCOUNTER — Ambulatory Visit: Payer: Medicare Other | Attending: Diagnostic Neuroimaging | Admitting: Physical Therapy

## 2014-10-09 DIAGNOSIS — M5010 Cervical disc disorder with radiculopathy, unspecified cervical region: Secondary | ICD-10-CM | POA: Diagnosis not present

## 2014-10-09 DIAGNOSIS — G8929 Other chronic pain: Secondary | ICD-10-CM | POA: Insufficient documentation

## 2014-10-09 DIAGNOSIS — M542 Cervicalgia: Secondary | ICD-10-CM

## 2014-10-09 NOTE — Therapy (Signed)
[facility] [address]  [place], [place], [address] Phone: [phone]   Fax:  [phone]  Physical Therapy Treatment  Patient Details  Name: [name] MRN: [id number] Date of Birth: 1940/11/28 Referring Provider:  Penni Bombard, MD  Encounter Date: 10/09/2014      PT End of Session - 10/09/14 1108    Visit Number 8   Number of Visits 16   Date for PT Re-Evaluation 11/09/14   PT Start Time 1102   PT Stop Time 1142   PT Time Calculation (min) 40 min   Activity Tolerance Patient tolerated treatment well   Behavior During Therapy [facility] for tasks assessed/performed      Past Medical History  Diagnosis Date  . Gastroparesis   . Pure hypercholesterolemia   . Headache(784.0)   . Unspecified essential hypertension   . Esophageal reflux   . Type II or unspecified type diabetes mellitus without mention of complication, not stated as uncontrolled   . Arthritis     Spinal Osteoarthritis  . Diverticulosis 08/30/2000    Colonoscopy   . Gastric polyp     Fundic Gland  . Anxiety   . Cataract   . Heart murmur     Echocardiogram 2/11: EF 60-65%, mild LAE, grade 1 diastolic dysfunction, aortic valve sclerosis, mean gradient 9 mm of mercury, PASP 34  . Chest pain     Myoview 12/15 no ischemia.  . Carcinoid tumor of stomach   . PSVT (paroxysmal supraventricular tachycardia)   . Leg swelling     bilateral  . Chronic kidney disease     Left kidney smaller than right kidney  . Cancer   . PONV (postoperative nausea and vomiting)     pt states only needs small amount of anesthesia  . Stroke     tia, 2014  . Iron deficiency anemia, unspecified   . Encounter for preventative adult health care exam with abnormal findings 09/14/2013  . Unspecified hereditary and idiopathic peripheral neuropathy 10/29/2013  . Diabetic peripheral neuropathy 10/29/2013  . Diabetes mellitus type 2 in obese 09/05/2006    Qualifier:  Diagnosis of  By: Marca Ancona RMA, Lucy    . Anemia 06/08/2014  . 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 Cholecystectomy  1993  . Tonsillectomy    . Colonoscopy  11/11/2010    diverticulosis  . Esophagogastroduodenoscopy  08/29/2010; 09/15/2010    Carcinoid tumor less than 1 cm in July 2012 not seen in August 2012 , gastritis, fundic gland polyps  . Eus  12/15/2010    Procedure: UPPER ENDOSCOPIC ULTRASOUND (EUS) LINEAR;  Surgeon: Owens Loffler, MD;  Location: [facility];  Service: Endoscopy;  Laterality: N/A;  . Esophagogastroduodenoscopy  05/16/2011  . Esophagogastroduodenoscopy  06/14/2012  . Dilatation & currettage/hysteroscopy with resectocope N/A 02/25/2013    Procedure: Attempted hysteroscopy with uterine perforation;  Surgeon: Jamey Reas de Berton Lan, MD;  Location: [facility];  Service: Gynecology;  Laterality: N/A;  . Laparoscopy N/A 02/25/2013    Procedure: Cystoscopy and laparoscopy with fulguration of uterine serosa;  Surgeon: Jamey Reas de Berton Lan, MD;  Location: [facility];  Service: Gynecology;  Laterality: N/A;    There were no vitals filed for this visit.  Visit Diagnosis:  Neck pain of over 3 months duration  Neck pain on left side  Cervical disc disorder with radiculopathy of  cervical region      Subjective Assessment - 10/09/14 1106    Subjective Patient presents looking more relaxed, less tense with decreased pain reports. Patient states she slept better last night.   Currently in Pain? Yes   Pain Score 4    Pain Location Neck   Pain Orientation Medial            [facility] PT Assessment - 10/09/14 1102    ROM / Strength   AROM / PROM / Strength AROM   AROM   Overall AROM Comments Bilateral shoulder ROM symmetrical with flexion and abduction limited to ~140 dg; pain limiting all cervical ROM in left SB and left rotation   AROM Assessment Site Cervical   Cervical Flexion 46    Cervical Extension 40   Cervical - Right Side Bend 32   Cervical - Left Side Bend 24   Cervical - Right Rotation 61   Cervical - Left Rotation 55                     [facility] Adult PT Treatment/Exercise - 10/09/14 1102    Exercises   Exercises Neck   Neck Exercises: Theraband   Shoulder External Rotation 10 reps;Red   Shoulder External Rotation Limitations Hooklying on 1/2 foam roll padded with folded towel   Horizontal ABduction 10 reps;Red   Horizontal ABduction Limitations Hooklying on 1/2 foam roll padded with folded towel   Neck Exercises: Supine   Neck Retraction 10 reps;3 secs  2 sets   X to V 10 reps   X to V Limitations Hooklying on 1/2 foam roll padded with folded towel   Shoulder Flexion Both;10 reps;Weights  2 sets   Shoulder Flexion Weights (lbs) 3   Shoulder Flexion Limitations Hooklying on 1/2 foam roll padded with folded towel; pullover   Shoulder ABduction Both;10 reps   Shoulder Abduction Weights (lbs) 2   Shoulder Abduction Limitations Alternating, hooklying on 1/2 foam roll padded with folded towel   Other Supine Exercise Cervical isometrics - L/R SB & L/R Rot, 5x5" each   Manual Therapy   Manual Therapy Soft tissue mobilization;Passive ROM;Manual Traction   Soft tissue mobilization Gentle SOC, levator, upper trap & SCM in supine; upper/mid trap and paraspinals in sitting leaning on hi/low table   Passive ROM Cervical ROM in all planes to patient tolerance   Manual Traction Gentle cervical distraction   Neck Exercises: Stretches   Upper Trapezius Stretch 20 seconds;3 reps  bilateral   Upper Trapezius Stretch Limitations manually   Levator Stretch 20 seconds;3 reps  bilateral   Levator Stretch Limitations manually   Chest Stretch --  2 min   Chest Stretch Limitations supine on 1/2 foam roll padded with folded towel                  PT Short Term Goals - 10/09/14 1153    PT SHORT TERM GOAL #1   Title Independent with initial HEP  (10/12/14)   Status Achieved           PT Long Term Goals - 10/09/14 1153    PT LONG TERM GOAL #1   Title Independent with advanced HEP as indicated (11/09/14)   Status On-going   PT LONG TERM GOAL #2   Title Patient will demonstrate increased cervical extension ROM by 20 degrees, and remaining movements by 5 degrees to allow for functional ROM of neck (11/09/14)   Status On-going   PT LONG TERM  GOAL #3   Title Patient will tolerate sitting for 30 minutes or greater without increased pain while watching TV or working on computer (11/09/14)   Status On-going   PT LONG TERM GOAL #4   Title Patient will demonstrate or at least verbalize awareness of neutral neck and shoulder posture (11/09/14)   Status On-going               Plan - 10/09/14 1153    Clinical Impression Statement Patient presents with decreased pain behaviors, improved posture and cheerier disposition. Patient reporting decreasing pain level and improved sleep. Cervical ROM continues to improve in all planes but more restricted on left with pain more localized to left levator area. Patient responding well to additional of positional stretching on 1/2 foam roll and manual therapy.   PT Next Visit Plan manual therapy including STM as tolerated, postural training, cervical & UE stretches/exercises with addition of isometrics, ?traction (manual and progress to mechanical if able), modalities PRN   Consulted and Agree with Plan of Care Patient        Problem List Patient Active Problem List   Diagnosis Date Noted  . 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 Anemia 06/08/2014  . Dysuria 01/07/2014  . RLS (restless legs syndrome) 11/23/2013  . Elevated troponin 11/13/2013  . Chest pain 11/13/2013  . Diabetic peripheral neuropathy 10/29/2013  . Left-sided thoracic back pain 10/06/2013  . Encounter for preventative adult health care exam with abnormal findings 09/14/2013  . Iron deficiency anemia    . Status post laparoscopy 02/25/2013  . GERD (gastroesophageal reflux disease) 01/09/2013  . Amaurosis fugax of left eye 10/16/2012  . Low back pain 06/03/2012  . Vitamin D deficiency 03/11/2012  . Bilateral hand pain 10/20/2011  . Encounter for long-term (current) use of other medications 10/20/2011  . IBS (irritable bowel syndrome) 08/14/2011  . TIA (transient ischemic attack) 02/10/2011  . Abnormal brain MRI 01/19/2011  . Allergic rhinitis 10/01/2010  . 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 FUNDIC GLAND POLYPS OF STOMACH 03/18/2010  . EPIGASTRIC PAIN chronic 03/18/2010  . ARTHRALGIA 03/17/2009  . SYSTOLIC MURMUR 09/81/1914  . Paroxysmal supraventricular tachycardia 01/12/2009  . PLANTAR FASCIITIS 06/08/2008  . CHEST PAIN 05/18/2008  . Gastroparesis 12/18/2007  . HYPERCHOLESTEROLEMIA 06/11/2007  . Diabetes mellitus type 2 in obese 09/05/2006  . Essential hypertension 09/05/2006    Percival Spanish, PT, MPT 10/09/2014, 11:58 AM  [facility] [address]  [place], [place], [address] Phone: [phone]   Fax:  [phone]

## 2014-10-14 ENCOUNTER — Ambulatory Visit: Payer: Medicare Other | Admitting: Physical Therapy

## 2014-10-14 DIAGNOSIS — G8929 Other chronic pain: Principal | ICD-10-CM

## 2014-10-14 DIAGNOSIS — M5010 Cervical disc disorder with radiculopathy, unspecified cervical region: Secondary | ICD-10-CM | POA: Diagnosis not present

## 2014-10-14 DIAGNOSIS — M542 Cervicalgia: Secondary | ICD-10-CM

## 2014-10-14 NOTE — Therapy (Signed)
[facility] [address]  [place], [place], [address] Phone: [phone]   Fax:  [phone]  Physical Therapy Treatment  Patient Details  Name: [name] MRN: [id number] Date of Birth: [birth year] Referring Provider:  Penni Bombard, MD  Encounter Date: 10/14/2014      PT End of Session - 10/14/14 1200    Visit Number 9   Number of Visits 16   Date for PT Re-Evaluation 11/09/14   PT Start Time 1151  Pt arrived late   PT Stop Time 1240   PT Time Calculation (min) 49 min   Activity Tolerance Patient tolerated treatment well   Behavior During Therapy [facility] for tasks assessed/performed      Past Medical History  Diagnosis Date  . Gastroparesis   . Pure hypercholesterolemia   . Headache(784.0)   . Unspecified essential hypertension   . Esophageal reflux   . Type II or unspecified type diabetes mellitus without mention of complication, not stated as uncontrolled   . Arthritis     Spinal Osteoarthritis  . Diverticulosis 08/30/2000    Colonoscopy   . Gastric polyp     Fundic Gland  . Anxiety   . Cataract   . Heart murmur     Echocardiogram 2/11: EF 60-65%, mild LAE, grade 1 diastolic dysfunction, aortic valve sclerosis, mean gradient 9 mm of mercury, PASP 34  . Chest pain     Myoview 12/15 no ischemia.  . Carcinoid tumor of stomach   . PSVT (paroxysmal supraventricular tachycardia)   . Leg swelling     bilateral  . Chronic kidney disease     Left kidney smaller than right kidney  . Cancer   . PONV (postoperative nausea and vomiting)     pt states only needs small amount of anesthesia  . Stroke     tia, 2014  . Iron deficiency anemia, unspecified   . Encounter for preventative adult health care exam with abnormal findings 09/14/2013  . Unspecified hereditary and idiopathic peripheral neuropathy 10/29/2013  . Diabetic peripheral neuropathy 10/29/2013  . Diabetes mellitus type 2 in obese  09/05/2006    Qualifier: Diagnosis of  By: Marca Ancona RMA, Lucy    . Anemia 06/08/2014  . 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 Cholecystectomy  1993  . Tonsillectomy    . Colonoscopy  11/11/2010    diverticulosis  . Esophagogastroduodenoscopy  08/29/2010; 09/15/2010    Carcinoid tumor less than 1 cm in July 2012 not seen in August 2012 , gastritis, fundic gland polyps  . Eus  12/15/2010    Procedure: UPPER ENDOSCOPIC ULTRASOUND (EUS) LINEAR;  Surgeon: Owens Loffler, MD;  Location: [facility];  Service: Endoscopy;  Laterality: N/A;  . Esophagogastroduodenoscopy  05/16/2011  . Esophagogastroduodenoscopy  06/14/2012  . Dilatation & currettage/hysteroscopy with resectocope N/A 02/25/2013    Procedure: Attempted hysteroscopy with uterine perforation;  Surgeon: Jamey Reas de Berton Lan, MD;  Location: [facility];  Service: Gynecology;  Laterality: N/A;  . Laparoscopy N/A 02/25/2013    Procedure: Cystoscopy and laparoscopy with fulguration of uterine serosa;  Surgeon: Jamey Reas de Berton Lan, MD;  Location: [facility];  Service: Gynecology;  Laterality: N/A;    There were no vitals filed for this visit.  Visit Diagnosis:  Neck pain of over 3 months duration  Neck pain on left side  Cervical disc  disorder with radiculopathy of cervical region      Subjective Assessment - 10/14/14 1154    Subjective Patient stating "not so good today". Reports pain increased while watching TV last night but unable to identify preciptating event.   Pain Score 6    Pain Location Neck   Pain Orientation Lower;Mid                         [facility] Adult PT Treatment/Exercise - 10/14/14 1151    Exercises   Exercises Neck   Neck Exercises: Theraband   Shoulder External Rotation 10 reps;Red   Shoulder External Rotation Limitations Hooklying on 1/2 foam roll padded with folded towel   Horizontal ABduction 10 reps;Red    Horizontal ABduction Limitations Hooklying on 1/2 foam roll padded with folded towel   Neck Exercises: Supine   Cervical Isometrics Right lateral flexion;Left lateral flexion;Right rotation;Left rotation;5 secs;5 reps   Neck Retraction 10 reps;3 secs  2 sets   X to V 10 reps;Weight   X to V Weights (lbs) 1   X to V Limitations Hooklying on 1/2 foam roll padded with folded towel   Shoulder Flexion Both;10 reps;Weights  2 sets   Shoulder Flexion Weights (lbs) 3   Shoulder Flexion Limitations Hooklying on 1/2 foam roll padded with folded towel; pullover   Shoulder ABduction Both;10 reps   Shoulder Abduction Weights (lbs) 2   Shoulder Abduction Limitations Alternating, hooklying on 1/2 foam roll padded with folded towel   Manual Therapy   Manual Therapy Soft tissue mobilization;Passive ROM;Manual Traction   Soft tissue mobilization Gentle SOC, levator, upper trap & SCM in supine; upper/mid trap and paraspinals in sitting leaning on hi/low table   Passive ROM Cervical ROM in all planes to patient tolerance   Manual Traction Gentle cervical distraction   Neck Exercises: Stretches   Upper Trapezius Stretch 20 seconds;3 reps  bilateral   Upper Trapezius Stretch Limitations manually + instructions for HEP stretch   Levator Stretch 20 seconds;3 reps  bilateral   Levator Stretch Limitations manually + instructions for HEP stretch   Chest Stretch --  2 min   Chest Stretch Limitations hooklying on 1/2 foam roll padded with folded towel                PT Education - 10/14/14 1244    Education Details Seated upper trap and levator stretches   Person(s) Educated Patient   Methods Explanation;Demonstration   Comprehension Verbalized understanding;Returned demonstration;Need further instruction          PT Short Term Goals - 10/09/14 1153    PT SHORT TERM GOAL #1   Title Independent with initial HEP (10/12/14)   Status Achieved           PT Long Term Goals - 10/09/14 1153     PT LONG TERM GOAL #1   Title Independent with advanced HEP as indicated (11/09/14)   Status On-going   PT LONG TERM GOAL #2   Title Patient will demonstrate increased cervical extension ROM by 20 degrees, and remaining movements by 5 degrees to allow for functional ROM of neck (11/09/14)   Status On-going   PT LONG TERM GOAL #3   Title Patient will tolerate sitting for 30 minutes or greater without increased pain while watching TV or working on computer (11/09/14)   Status On-going   PT LONG TERM GOAL #4   Title Patient will demonstrate or at least verbalize awareness of  neutral neck and shoulder posture (11/09/14)   Status On-going               Plan - 10/14/14 1245    Clinical Impression Statement Patient with improving tolerance for positional stretching lying on 1/2 foam roll, with patient noting improved spinal posture after stretch. Provided instruction in upper trap and levator stretches for HEP to promote increased ROM and release tension for decreased pain. Given continued fluctuation in pain levels, discussed estim/TENS as option for pain management with patient stating her husband had a TENS unit at home that she could use. She will look for the TENS unit at home and bring it with to next therapy session for trial/training.   PT Next Visit Plan manual therapy including STM as tolerated, postural training, cervical & UE stretches/exercises, cervical isometrics/progression of strengthening as tolerated, trial of estim/TENS, modalities PRN   Consulted and Agree with Plan of Care Patient        Problem List Patient Active Problem List   Diagnosis Date Noted  . 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 Anemia 06/08/2014  . Dysuria 01/07/2014  . RLS (restless legs syndrome) 11/23/2013  . Elevated troponin 11/13/2013  . Chest pain 11/13/2013  . Diabetic peripheral neuropathy 10/29/2013  . Left-sided thoracic back pain 10/06/2013  . Encounter for  preventative adult health care exam with abnormal findings 09/14/2013  . Iron deficiency anemia   . Status post laparoscopy 02/25/2013  . GERD (gastroesophageal reflux disease) 01/09/2013  . Amaurosis fugax of left eye 10/16/2012  . Low back pain 06/03/2012  . Vitamin D deficiency 03/11/2012  . Bilateral hand pain 10/20/2011  . Encounter for long-term (current) use of other medications 10/20/2011  . IBS (irritable bowel syndrome) 08/14/2011  . TIA (transient ischemic attack) 02/10/2011  . Abnormal brain MRI 01/19/2011  . Allergic rhinitis 10/01/2010  . 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 FUNDIC GLAND POLYPS OF STOMACH 03/18/2010  . EPIGASTRIC PAIN chronic 03/18/2010  . ARTHRALGIA 03/17/2009  . SYSTOLIC MURMUR 74/01/8785  . Paroxysmal supraventricular tachycardia 01/12/2009  . PLANTAR FASCIITIS 06/08/2008  . CHEST PAIN 05/18/2008  . Gastroparesis 12/18/2007  . HYPERCHOLESTEROLEMIA 06/11/2007  . Diabetes mellitus type 2 in obese 09/05/2006  . Essential hypertension 09/05/2006    Percival Spanish, PT, MPT 10/14/2014, 12:53 PM  [facility] [address]  [place], [place], [address] Phone: [phone]   Fax:  [phone]

## 2014-10-16 ENCOUNTER — Ambulatory Visit: Payer: Medicare Other | Admitting: Physical Therapy

## 2014-10-16 DIAGNOSIS — M5010 Cervical disc disorder with radiculopathy, unspecified cervical region: Secondary | ICD-10-CM

## 2014-10-16 DIAGNOSIS — M542 Cervicalgia: Secondary | ICD-10-CM | POA: Diagnosis not present

## 2014-10-16 DIAGNOSIS — G8929 Other chronic pain: Principal | ICD-10-CM

## 2014-10-16 NOTE — Therapy (Signed)
[facility] [address]  [place], [place], [address] Phone: [phone]   Fax:  [phone]  Physical Therapy Treatment  Patient Details  Name: [name] MRN: [id number] Date of Birth: [birth year] Referring Provider:  Penni Bombard, MD  Encounter Date: 10/16/2014      PT End of Session - 10/16/14 1114    Visit Number 10   Number of Visits 16   Date for PT Re-Evaluation 11/09/14   PT Start Time 1106  Pt arrived late   PT Stop Time 1208   PT Time Calculation (min) 62 min   Activity Tolerance Patient tolerated treatment well   Behavior During Therapy [facility] for tasks assessed/performed      Past Medical History  Diagnosis Date  . Gastroparesis   . Pure hypercholesterolemia   . Headache(784.0)   . Unspecified essential hypertension   . Esophageal reflux   . Type II or unspecified type diabetes mellitus without mention of complication, not stated as uncontrolled   . Arthritis     Spinal Osteoarthritis  . Diverticulosis 08/30/2000    Colonoscopy   . Gastric polyp     Fundic Gland  . Anxiety   . Cataract   . Heart murmur     Echocardiogram 2/11: EF 60-65%, mild LAE, grade 1 diastolic dysfunction, aortic valve sclerosis, mean gradient 9 mm of mercury, PASP 34  . Chest pain     Myoview 12/15 no ischemia.  . Carcinoid tumor of stomach   . PSVT (paroxysmal supraventricular tachycardia)   . Leg swelling     bilateral  . Chronic kidney disease     Left kidney smaller than right kidney  . Cancer   . PONV (postoperative nausea and vomiting)     pt states only needs small amount of anesthesia  . Stroke     tia, 2014  . Iron deficiency anemia, unspecified   . Encounter for preventative adult health care exam with abnormal findings 09/14/2013  . Unspecified hereditary and idiopathic peripheral neuropathy 10/29/2013  . Diabetic peripheral neuropathy 10/29/2013  . Diabetes mellitus type 2 in obese  09/05/2006    Qualifier: Diagnosis of  By: Marca Ancona RMA, Lucy    . Anemia 06/08/2014  . 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 Cholecystectomy  1993  . Tonsillectomy    . Colonoscopy  11/11/2010    diverticulosis  . Esophagogastroduodenoscopy  08/29/2010; 09/15/2010    Carcinoid tumor less than 1 cm in July 2012 not seen in August 2012 , gastritis, fundic gland polyps  . Eus  12/15/2010    Procedure: UPPER ENDOSCOPIC ULTRASOUND (EUS) LINEAR;  Surgeon: Owens Loffler, MD;  Location: [facility];  Service: Endoscopy;  Laterality: N/A;  . Esophagogastroduodenoscopy  05/16/2011  . Esophagogastroduodenoscopy  06/14/2012  . Dilatation & currettage/hysteroscopy with resectocope N/A 02/25/2013    Procedure: Attempted hysteroscopy with uterine perforation;  Surgeon: Jamey Reas de Berton Lan, MD;  Location: [facility];  Service: Gynecology;  Laterality: N/A;  . Laparoscopy N/A 02/25/2013    Procedure: Cystoscopy and laparoscopy with fulguration of uterine serosa;  Surgeon: Jamey Reas de Berton Lan, MD;  Location: [facility];  Service: Gynecology;  Laterality: N/A;    There were no vitals filed for this visit.  Visit Diagnosis:  Neck pain of over 3 months duration  Neck pain on left side  Cervical disc  disorder with radiculopathy of cervical region      Subjective Assessment - 10/16/14 1113    Subjective Patient hurting again today. States tried new stretches/exercises yesterday but still has pain.   Currently in Pain? Yes   Pain Score 6    Pain Location Neck   Pain Orientation Lower;Mid            [facility] PT Assessment - 10/16/14 0001    Observation/Other Assessments   Focus on Therapeutic Outcomes (FOTO)  55% (45% limitation)   ROM / Strength   AROM / PROM / Strength AROM   AROM   AROM Assessment Site Cervical   Cervical Flexion 50   Cervical Extension 38   Cervical - Right Side Bend 30   Cervical - Left  Side Bend 28   Cervical - Right Rotation 64   Cervical - Left Rotation 62                     [facility] Adult PT Treatment/Exercise - 10/16/14 1106    Exercises   Exercises Neck   Neck Exercises: Theraband   Shoulder External Rotation 15 reps;Red   Shoulder External Rotation Limitations Hooklying on 1/2 foam roll   Horizontal ABduction 15 reps;Red   Horizontal ABduction Limitations Hooklying on 1/2 foam roll   Neck Exercises: Supine   Cervical Isometrics Right lateral flexion;Left lateral flexion;Right rotation;Left rotation;5 secs;5 reps   Neck Retraction 10 reps;3 secs  2 sets   Shoulder Flexion Both;10 reps;Weights  2 sets   Shoulder Flexion Weights (lbs) 3   Shoulder Flexion Limitations Hooklying on 1/2 foam roll; pullover   Shoulder ABduction Both;10 reps   Shoulder Abduction Weights (lbs) 2   Shoulder Abduction Limitations Alternating, hooklying on 1/2 foam roll   Other Supine Exercise Serratus punch 1# x10, bilateral    Modalities   Modalities Electrical Stimulation   Electrical Stimulation   Electrical Stimulation Location Lower cervical/upper trap   Electrical Stimulation Action TENS   Electrical Stimulation Parameters Protocol #7 - Modulated   Electrical Stimulation Goals Pain   Manual Therapy   Manual Therapy Soft tissue mobilization;Passive ROM;Manual Traction   Soft tissue mobilization Gentle SOC, levator, upper trap & SCM in supine; upper/mid trap and paraspinals in sitting leaning on hi/low table   Neck Exercises: Stretches   Upper Trapezius Stretch 20 seconds;3 reps  bilateral   Upper Trapezius Stretch Limitations manually   Levator Stretch 20 seconds;3 reps  bilateral   Levator Stretch Limitations manually   Chest Stretch --  2 min   Chest Stretch Limitations hooklying on 1/2 foam roll padded with folded towel                PT Education - 10/16/14 1230    Education Details TENS unit - precautions, instructions for use   Person(s)  Educated Patient   Methods Explanation;Demonstration   Comprehension Verbalized understanding;Returned demonstration          PT Short Term Goals - 10/09/14 1153    PT SHORT TERM GOAL #1   Title Independent with initial HEP (10/12/14)   Status Achieved           PT Long Term Goals - 10/16/14 1232    PT LONG TERM GOAL #1   Title Independent with advanced HEP as indicated (11/09/14)   Status On-going   PT LONG TERM GOAL #2   Title Patient will demonstrate increased cervical extension ROM by 20 degrees, and remaining movements by 5  degrees to allow for functional ROM of neck (11/09/14)   Status On-going   PT LONG TERM GOAL #3   Title Patient will tolerate sitting for 30 minutes or greater without increased pain while watching TV or working on computer (11/09/14)   Status On-going   PT LONG TERM GOAL #4   Title Patient will demonstrate or at least verbalize awareness of neutral neck and shoulder posture (11/09/14)   Status On-going               Plan - 11/09/14 1221    Clinical Impression Statement Patient demonstrating significant improvement in cervical ROM since evaluation with improving function as reported by improvement in the FOTO score from 62% limitation to 45% limitation. Pain overall improved but with slight increase this past week, therefore discussed option of use of TENS unit for pain control. Patient brought husband's TENS unit to therapy today but neglected to charge the batteries, therefore limited opportunity to trial TENS unit during therapy. After charging battery during therapy session, provided instruction to patient in operation of Empi TENS unit, including electrode appliccation, powering unit on, and ajustment of intensity; with PT setting treatment protocol. Patient also instructed in precautions associated with TENS use. Patient to trial TENS for short periods (~20 min intervals) and bring unit with her to next therapy visit for adjustment and further review  as needed.   PT Next Visit Plan Review of TENS use, manual therapy including STM as tolerated, postural training, cervical & UE stretches/exercises, cervical isometrics/progression of strengthening as tolerated, modalities PRN   Consulted and Agree with Plan of Care Patient          G-Codes - 11/09/2014 1233    Functional Assessment Tool Used FOTO = 55% (45% limitation)   Functional Limitation Changing and maintaining body position   Changing and Maintaining Body Position Current Status (W2585) At least 40 percent but less than 60 percent impaired, limited or restricted   Changing and Maintaining Body Position Goal Status (I7782) At least 40 percent but less than 60 percent impaired, limited or restricted      Problem List Patient Active Problem List   Diagnosis Date Noted  . 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 Anemia 06/08/2014  . Dysuria 01/07/2014  . RLS (restless legs syndrome) 11/23/2013  . Elevated troponin 11/13/2013  . Chest pain 11/13/2013  . Diabetic peripheral neuropathy 10/29/2013  . Left-sided thoracic back pain 10/06/2013  . Encounter for preventative adult health care exam with abnormal findings 09/14/2013  . Iron deficiency anemia   . Status post laparoscopy 02/25/2013  . GERD (gastroesophageal reflux disease) 01/09/2013  . Amaurosis fugax of left eye 10/16/2012  . Low back pain 06/03/2012  . Vitamin D deficiency 03/11/2012  . Bilateral hand pain 10/20/2011  . Encounter for long-term (current) use of other medications 10/20/2011  . IBS (irritable bowel syndrome) 08/14/2011  . TIA (transient ischemic attack) 02/10/2011  . Abnormal brain MRI 01/19/2011  . Allergic rhinitis 10/01/2010  . 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 FUNDIC GLAND POLYPS OF STOMACH 03/18/2010  . EPIGASTRIC PAIN chronic 03/18/2010  . ARTHRALGIA 03/17/2009  . SYSTOLIC MURMUR 42/35/3614  . Paroxysmal supraventricular tachycardia 01/12/2009  . PLANTAR  FASCIITIS 06/08/2008  . CHEST PAIN 05/18/2008  . Gastroparesis 12/18/2007  . HYPERCHOLESTEROLEMIA 06/11/2007  . Diabetes mellitus type 2 in obese 09/05/2006  . Essential hypertension 09/05/2006    Percival Spanish, PT, MPT November 09, 2014, 12:41 PM  [facility] [address]  [place], [place], [address] Phone: [phone]   Fax:  [phone]

## 2014-10-21 ENCOUNTER — Ambulatory Visit: Payer: Medicare Other | Admitting: Physical Therapy

## 2014-10-22 ENCOUNTER — Encounter: Payer: Self-pay | Admitting: Hematology & Oncology

## 2014-10-22 ENCOUNTER — Ambulatory Visit (HOSPITAL_BASED_OUTPATIENT_CLINIC_OR_DEPARTMENT_OTHER): Payer: Medicare Other | Admitting: Hematology & Oncology

## 2014-10-22 ENCOUNTER — Other Ambulatory Visit (HOSPITAL_BASED_OUTPATIENT_CLINIC_OR_DEPARTMENT_OTHER): Payer: Medicare Other

## 2014-10-22 VITALS — BP 149/67 | HR 63 | Temp 97.9°F | Resp 16 | Ht 61.0 in | Wt 176.0 lb

## 2014-10-22 DIAGNOSIS — K909 Intestinal malabsorption, unspecified: Secondary | ICD-10-CM

## 2014-10-22 DIAGNOSIS — D509 Iron deficiency anemia, unspecified: Secondary | ICD-10-CM

## 2014-10-22 DIAGNOSIS — K904 Malabsorption due to intolerance, not elsewhere classified: Secondary | ICD-10-CM | POA: Diagnosis not present

## 2014-10-22 LAB — CBC WITH DIFFERENTIAL (CANCER CENTER ONLY)
BASO#: 0 10*3/uL (ref 0.0–0.2)
BASO%: 0.5 % (ref 0.0–2.0)
EOS%: 0.9 % (ref 0.0–7.0)
Eosinophils Absolute: 0.1 10*3/uL (ref 0.0–0.5)
HEMATOCRIT: 37.6 % (ref 34.8–46.6)
HEMOGLOBIN: 12.8 g/dL (ref 11.6–15.9)
LYMPH#: 2.9 10*3/uL (ref 0.9–3.3)
LYMPH%: 34.4 % (ref 14.0–48.0)
MCH: 29.8 pg (ref 26.0–34.0)
MCHC: 34 g/dL (ref 32.0–36.0)
MCV: 87 fL (ref 81–101)
MONO#: 0.8 10*3/uL (ref 0.1–0.9)
MONO%: 8.8 % (ref 0.0–13.0)
NEUT%: 55.4 % (ref 39.6–80.0)
NEUTROS ABS: 4.7 10*3/uL (ref 1.5–6.5)
Platelets: 222 10*3/uL (ref 145–400)
RBC: 4.3 10*6/uL (ref 3.70–5.32)
RDW: 12 % (ref 11.1–15.7)
WBC: 8.5 10*3/uL (ref 3.9–10.0)

## 2014-10-22 LAB — RETICULOCYTES (CHCC)
ABS RETIC: 56.3 10*3/uL (ref 19.0–186.0)
RBC.: 4.33 MIL/uL (ref 3.87–5.11)
RETIC CT PCT: 1.3 % (ref 0.4–2.3)

## 2014-10-22 LAB — CHCC SATELLITE - SMEAR

## 2014-10-22 NOTE — Progress Notes (Signed)
Patient states PCP will give flu vaccine

## 2014-10-22 NOTE — Progress Notes (Signed)
Hematology and Oncology Follow Up Visit  [name] [id number] Dec 02, 1940 74 y.o. 10/22/2014   Principle Diagnosis:   Iron deficiency anemia secondary to malabsorption  Current Therapy:    IV iron-patient received 2 doses back in May 2016     Interim History:  [name] is back for follow-up. She feels better. Back in June we last saw her, her ferritin was up to 250 with an iron saturation of 27%.  She does take quite a few medications. She is diabetic.  She does take Nexium. I told her to make sure she stops taking oral iron.  She's had no melena or bright red blood per rectum.  She's had no cough or shortness of breath.  She's had no rashes.  She's had no change in bowel or bladder habits.  She'll be going over to [place] in the spring. She is not sure exactly when. I want to make sure that we continue to follow her along so that her blood will be as it is possible for she has make the long journey back to [place].   Overall, her performance status is ECOG 1.  Medications:  Current outpatient prescriptions:  .  acetaminophen (TYLENOL) 500 MG tablet, Take 2 tablets (1,000 mg total) by mouth every 6 (six) hours as needed for moderate pain., Disp: 30 tablet, Rfl: 0 .  amLODipine (NORVASC) 5 MG tablet, Take 1 tablet by mouth  daily, Disp: 90 tablet, Rfl: 0 .  aspirin 81 MG tablet, Take 81 mg by mouth daily., Disp: , Rfl:  .  calcium-vitamin D (OSCAL WITH D) 500-200 MG-UNIT per tablet, Take 1 tablet by mouth., Disp: , Rfl:  .  cholecalciferol (VITAMIN D) 1000 UNITS tablet, Take 1,000 Units by mouth daily.  , Disp: , Rfl:  .  dicyclomine (BENTYL) 10 MG capsule, Take 10 mg by mouth 4 (four) times daily -  before meals and at bedtime., Disp: , Rfl:  .  esomeprazole (NEXIUM) 40 MG capsule, Take 1 capsule (40 mg total) by mouth daily before breakfast., Disp: 90 capsule, Rfl: 3 .  FOLBIC 2.5-25-2 MG TABS, TAKE ONE TABLET BY MOUTH EVERY DAY, Disp: 90 tablet, Rfl: 3 .   glucose blood test strip, Use as directed once daily to check blood sugar.  DX E11.9, Disp: 100 each, Rfl: 3 .  Lancets MISC, Use as instructed once daily to check blood sugar. DX E11.9, Disp: 200 each, Rfl: 2 .  metFORMIN (GLUCOPHAGE-XR) 500 MG 24 hr tablet, Take 2 tablets (1,000 mg total) by mouth 2 (two) times daily., Disp: 180 tablet, Rfl: 1 .  metoCLOPramide (REGLAN) 5 MG tablet, Take 1 tablet (5 mg total) by mouth 2 (two) times daily., Disp: 180 tablet, Rfl: 1 .  metoprolol (LOPRESSOR) 50 MG tablet, Take 1 tablet (50 mg total) by mouth 2 (two) times   daily., Disp: 180 tablet, Rfl: 3 .  Multiple Vitamin (MULTIVITAMIN) tablet, Take 1 tablet by mouth daily.  , Disp: , Rfl:  .  ondansetron (ZOFRAN) 4 MG tablet, Take 4 mg by mouth every 4 (four) hours as needed. For nausea  , Disp: , Rfl:  .  potassium chloride (K-DUR,KLOR-CON) 10 MEQ tablet, Take 1 tablet by mouth  daily, Disp: 60 tablet, Rfl: 2 .  rosuvastatin (CRESTOR) 10 MG tablet, Take 0.5-1 tablets (5-10 mg total) by mouth daily. 1 WHOLE TABLET 4 DAYS A WEEK AND I/2 TABLET 3 DAYS A WEEK, Disp: 45 tablet, Rfl: 0 .  valsartan (DIOVAN) 320 MG  tablet, Take 1 tablet by mouth  daily, Disp: 90 tablet, Rfl: 2 .  vitamin E 400 UNIT capsule, Take 400 Units by mouth daily., Disp: , Rfl:   Allergies:  Allergies  Allergen Reactions  . Tramadol     Dizziness     Past Medical History, Surgical history, Social history, and Family History were reviewed and updated.  Review of Systems: As above  Physical Exam:  height is 5\' 1"  (1.549 m) and weight is 176 lb (79.833 kg). Her oral temperature is 97.9 F (36.6 C). Her blood pressure is 149/67 and her pulse is 63. Her respiration is 16.   Wt Readings from Last 3 Encounters:  10/22/14 176 lb (79.833 kg)  10/08/14 176 lb (79.833 kg)  09/02/14 176 lb (79.833 kg)     Well-developed well-nourished white female in no obvious distress. Head and neck exam shows no ocular or oral lesions. There are no  palpable cervical or supraclavicular lymph nodes. Lungs are clear. Cardiac exam regular rate and rhythm with no murmurs, rubs or bruits. Abdomen is soft. She has good bowel sounds. There is no fluid wave. There is no palpable liver or spleen tip. Back exam shows no tenderness over the spine, ribs or hips. Extremities shows no clubbing, cyanosis or edema. Skin exam shows no rashes, ecchymosis or petechia. Neurological exam shows no focal neurological deficits.  Lab Results  Component Value Date   WBC 8.5 10/22/2014   HGB 12.8 10/22/2014   HCT 37.6 10/22/2014   MCV 87 10/22/2014   PLT 222 10/22/2014     Chemistry      Component Value Date/Time   NA 135 08/11/2014 0758   K 4.0 08/11/2014 0758   CL 100 08/11/2014 0758   CO2 25 08/11/2014 0758   BUN 12 08/11/2014 0758   CREATININE 0.63 08/11/2014 0758   CREATININE 0.71 05/04/2014 0829      Component Value Date/Time   CALCIUM 9.5 08/11/2014 0758   ALKPHOS 52 08/11/2014 0758   AST 22 08/11/2014 0758   ALT 28 08/11/2014 0758   BILITOT 0.6 08/11/2014 0758         Impression and Plan: [name] is 74 year old white female. She has iron deficiency. I suspect that this is malabsorption. She's had stools tested which were negative for blood.  I think at this point, we can get her back in 3 months. It is possible that she may need iron in the future.   She tolerated the IV iron well so if we had to, we can certainly do another dose for her.   Volanda Napoleon, MD 9/15/20162:26 PM

## 2014-10-23 LAB — IRON AND TIBC CHCC
%SAT: 29 % (ref 21–57)
Iron: 87 ug/dL (ref 41–142)
TIBC: 303 ug/dL (ref 236–444)
UIBC: 216 ug/dL (ref 120–384)

## 2014-10-23 LAB — FERRITIN CHCC: FERRITIN: 123 ng/mL (ref 9–269)

## 2014-10-26 ENCOUNTER — Encounter: Payer: Self-pay | Admitting: Internal Medicine

## 2014-10-26 ENCOUNTER — Ambulatory Visit (AMBULATORY_SURGERY_CENTER): Payer: Medicare Other | Admitting: Internal Medicine

## 2014-10-26 VITALS — BP 135/80 | HR 65 | Temp 99.1°F | Resp 20 | Ht 61.0 in | Wt 176.0 lb

## 2014-10-26 DIAGNOSIS — K2970 Gastritis, unspecified, without bleeding: Secondary | ICD-10-CM | POA: Diagnosis not present

## 2014-10-26 DIAGNOSIS — E119 Type 2 diabetes mellitus without complications: Secondary | ICD-10-CM | POA: Diagnosis not present

## 2014-10-26 DIAGNOSIS — K3184 Gastroparesis: Secondary | ICD-10-CM | POA: Diagnosis not present

## 2014-10-26 DIAGNOSIS — D131 Benign neoplasm of stomach: Secondary | ICD-10-CM

## 2014-10-26 DIAGNOSIS — K2950 Unspecified chronic gastritis without bleeding: Secondary | ICD-10-CM | POA: Diagnosis not present

## 2014-10-26 DIAGNOSIS — I10 Essential (primary) hypertension: Secondary | ICD-10-CM | POA: Diagnosis not present

## 2014-10-26 DIAGNOSIS — K317 Polyp of stomach and duodenum: Secondary | ICD-10-CM | POA: Diagnosis not present

## 2014-10-26 LAB — GLUCOSE, CAPILLARY
GLUCOSE-CAPILLARY: 115 mg/dL — AB (ref 65–99)
Glucose-Capillary: 94 mg/dL (ref 65–99)

## 2014-10-26 MED ORDER — SODIUM CHLORIDE 0.9 % IV SOLN: 500.0000 mL | INTRAVENOUS | Status: DC

## 2014-10-26 NOTE — Patient Instructions (Addendum)
   I took biopsies of the polyps again and will let you know.  I appreciate the opportunity to care for you. Gatha Mayer, MD, FACG  YOU HAD AN ENDOSCOPIC PROCEDURE TODAY AT [facility]:   Refer to the procedure report that was given to you for any specific questions about what was found during the examination.  If the procedure report does not answer your questions, please call your gastroenterologist to clarify.  If you requested that your care partner not be given the details of your procedure findings, then the procedure report has been included in a sealed envelope for you to review at your convenience later.  YOU SHOULD EXPECT: Some feelings of bloating in the abdomen. Passage of more gas than usual.  Walking can help get rid of the air that was put into your GI tract during the procedure and reduce the bloating.  Please Note:  You might notice some irritation and congestion in your nose or some drainage.  This is from the oxygen used during your procedure.  There is no need for concern and it should clear up in a day or so.  SYMPTOMS TO REPORT IMMEDIATELY:   Following upper endoscopy (EGD)  Vomiting of blood or coffee ground material  New chest pain or pain under the shoulder blades  Painful or persistently difficult swallowing  New shortness of breath  Fever of 100F or higher  Black, tarry-looking stools  For urgent or emergent issues, a gastroenterologist can be reached at any hour by calling [phone].   DIET: Your first meal following the procedure should be a small meal and then it is ok to progress to your normal diet. Heavy or fried foods are harder to digest and may make you feel nauseous or bloated.  Likewise, meals heavy in dairy and vegetables can increase bloating.  Drink plenty of fluids but you should avoid alcoholic beverages for 24 hours.  ACTIVITY:  You should plan to take it easy for the rest of today and you should NOT DRIVE or use  heavy machinery until tomorrow (because of the sedation medicines used during the test).    FOLLOW UP: Our staff will call the number listed on your records the next business day following your procedure to check on you and address any questions or concerns that you may have regarding the information given to you following your procedure. If we do not reach you, we will leave a message.  However, if you are feeling well and you are not experiencing any problems, there is no need to return our call.  We will assume that you have returned to your regular daily activities without incident.  If any biopsies were taken you will be contacted by phone or by letter within the next 1-3 weeks.  Please call us at [phone] if you have not heard about the biopsies in 3 weeks.    SIGNATURES/CONFIDENTIALITY: You and/or your care partner have signed paperwork which will be entered into your electronic medical record.  These signatures attest to the fact that that the information above on your After Visit Summary has been reviewed and is understood.  Full responsibility of the confidentiality of this discharge information lies with you and/or your care-partner.

## 2014-10-26 NOTE — Progress Notes (Signed)
Called to room to assist during endoscopic procedure.  Patient ID and intended procedure confirmed with present staff. Received instructions for my participation in the procedure from the performing physician.  

## 2014-10-26 NOTE — Progress Notes (Signed)
Transferred to recovery room. A/O x3, pleased with MAC.  VSS.  Report to Suzanne, RN. 

## 2014-10-26 NOTE — Op Note (Signed)
[facility]  [address] [place], [id number]   ENDOSCOPY PROCEDURE REPORT  PATIENT: Valerie, [name]  MR#: [id number] BIRTHDATE: [birth year] , 74  yrs. old GENDER: female ENDOSCOPIST: Gatha Mayer, MD, [facility] PROCEDURE DATE:  10/26/2014 PROCEDURE:  EGD w/ biopsy ASA CLASS:     Class II INDICATIONS:  surveillance and gastric carcinoid in past and fundic gland polyps. MEDICATIONS: Propofol 200 mg IV and Monitored anesthesia care TOPICAL ANESTHETIC: none  DESCRIPTION OF PROCEDURE: After the risks benefits and alternatives of the procedure were thoroughly explained, informed consent was obtained.  The LB [id number] endoscope was introduced through the mouth and advanced to the second portion of the duodenum , Without limitations.  The instrument was slowly withdrawn as the mucosa was fully examined.    1) Numerous diminutive proximal gastric polyps as before.  Nothing suspicious.  representative biopsies taken and biopsies of largest polyp seen (5 mm) placed in container 2 2) Non-erosive antral gastriti as before 3) Otherwise normal.  Retroflexed views revealed as previously described.     The scope was then withdrawn from the patient and the procedure completed.  COMPLICATIONS: There were no immediate complications.  ENDOSCOPIC IMPRESSION: 1) Numerous diminutive proximal gastric polyps as before.  Nothing suspicious.  representative biopsies taken and biopsies of largest polyp seen (5 mm) placed in container 2 2) Non-erosive antral gastriti as before 3) Otherwise normal  RECOMMENDATIONS: Office will call with results  and plans   eSigned:  Gatha Mayer, MD, [facility] 10/26/2014 10:38 AM    CC:The Patient

## 2014-10-27 ENCOUNTER — Telehealth: Payer: Self-pay | Admitting: *Deleted

## 2014-10-27 ENCOUNTER — Ambulatory Visit: Payer: Medicare Other | Admitting: Physical Therapy

## 2014-10-27 DIAGNOSIS — M542 Cervicalgia: Secondary | ICD-10-CM | POA: Diagnosis not present

## 2014-10-27 DIAGNOSIS — M5010 Cervical disc disorder with radiculopathy, unspecified cervical region: Secondary | ICD-10-CM | POA: Diagnosis not present

## 2014-10-27 DIAGNOSIS — G8929 Other chronic pain: Secondary | ICD-10-CM | POA: Diagnosis not present

## 2014-10-27 NOTE — Therapy (Signed)
[facility] [address]  [place], [place], [address] Phone: [phone]   Fax:  [phone]  Physical Therapy Treatment  Patient Details  Name: [name] MRN: [id number] Date of Birth: [birth year] Referring Krystyna Cleckley:  Penni Bombard, MD  Encounter Date: 10/27/2014      PT End of Session - 10/27/14 1322    Visit Number 11   Number of Visits 16   Date for PT Re-Evaluation 11/09/14   PT Start Time 1316   PT Stop Time 1402   PT Time Calculation (min) 46 min   Activity Tolerance Patient tolerated treatment well   Behavior During Therapy [facility] for tasks assessed/performed      Past Medical History  Diagnosis Date  . Gastroparesis   . Pure hypercholesterolemia   . Headache(784.0)   . Unspecified essential hypertension   . Esophageal reflux   . Type II or unspecified type diabetes mellitus without mention of complication, not stated as uncontrolled   . Arthritis     Spinal Osteoarthritis  . Diverticulosis 08/30/2000    Colonoscopy   . Gastric polyp     Fundic Gland  . Anxiety   . Cataract   . Heart murmur     Echocardiogram 2/11: EF 60-65%, mild LAE, grade 1 diastolic dysfunction, aortic valve sclerosis, mean gradient 9 mm of mercury, PASP 34  . Chest pain     Myoview 12/15 no ischemia.  . Carcinoid tumor of stomach   . PSVT (paroxysmal supraventricular tachycardia)   . Leg swelling     bilateral  . Chronic kidney disease     Left kidney smaller than right kidney  . Cancer   . PONV (postoperative nausea and vomiting)     pt states only needs small amount of anesthesia  . Stroke     tia, 2014  . Iron deficiency anemia, unspecified   . Encounter for preventative adult health care exam with abnormal findings 09/14/2013  . Unspecified hereditary and idiopathic peripheral neuropathy 10/29/2013  . Diabetic peripheral neuropathy 10/29/2013  . Diabetes mellitus type 2 in obese 09/05/2006   Qualifier: Diagnosis of  By: Marca Ancona RMA, Lucy    . Anemia 06/08/2014  . 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 Cholecystectomy  1993  . Tonsillectomy    . Colonoscopy  11/11/2010    diverticulosis  . Esophagogastroduodenoscopy  08/29/2010; 09/15/2010    Carcinoid tumor less than 1 cm in July 2012 not seen in August 2012 , gastritis, fundic gland polyps  . Eus  12/15/2010    Procedure: UPPER ENDOSCOPIC ULTRASOUND (EUS) LINEAR;  Surgeon: Owens Loffler, MD;  Location: [facility];  Service: Endoscopy;  Laterality: N/A;  . Esophagogastroduodenoscopy  05/16/2011  . Esophagogastroduodenoscopy  06/14/2012  . Dilatation & currettage/hysteroscopy with resectocope N/A 02/25/2013    Procedure: Attempted hysteroscopy with uterine perforation;  Surgeon: Jamey Reas de Berton Lan, MD;  Location: [facility];  Service: Gynecology;  Laterality: N/A;  . Laparoscopy N/A 02/25/2013    Procedure: Cystoscopy and laparoscopy with fulguration of uterine serosa;  Surgeon: Jamey Reas de Berton Lan, MD;  Location: [facility];  Service: Gynecology;  Laterality: N/A;    There were no vitals filed for this visit.  Visit Diagnosis:  Neck pain of over 3 months duration  Neck pain on left side  Cervical disc disorder with radiculopathy of cervical region  Subjective Assessment - 10/27/14 1320    Subjective Patient reporting tired from GI procedure yesterday. Has not used TENS since last visit stating it made her very sore that night. States pain today is much better than before.   Currently in Pain? Yes   Pain Score 6    Pain Location Neck                         [facility] Adult PT Treatment/Exercise - 10/27/14 1316    Exercises   Exercises Neck   Neck Exercises: Theraband   Shoulder External Rotation 15 reps;Green   Shoulder External Rotation Limitations Hooklying on 1/2 foam roll   Horizontal ABduction 15  reps;Green   Horizontal ABduction Limitations Hooklying on 1/2 foam roll   Neck Exercises: Supine   Cervical Isometrics Right lateral flexion;Left lateral flexion;Right rotation;Left rotation;5 secs;5 reps   Neck Retraction 15 reps;3 secs  2 sets   Shoulder Flexion Both;10 reps;Weights  2 sets   Shoulder Flexion Weights (lbs) 4   Shoulder Flexion Limitations Hooklying on 1/2 foam roll; pullover   Shoulder ABduction Both;10 reps   Shoulder Abduction Weights (lbs) 2   Shoulder Abduction Limitations Alternating, hookyling on 1/2 foam roll   Other Supine Exercise Serratus punch 2# x10, bilateral    Manual Therapy   Manual Therapy Soft tissue mobilization;Passive ROM;Manual Traction   Soft tissue mobilization Gentle SOC, levator, upper trap & SCM in supine; upper/mid trap and paraspinals in sitting leaning on hi/low table   Passive ROM Cervical ROM in all planes to patient tolerance   Neck Exercises: Stretches   Upper Trapezius Stretch 20 seconds;3 reps  bilateral   Upper Trapezius Stretch Limitations manually   Levator Stretch 20 seconds;3 reps  bilateral   Levator Stretch Limitations manually   Chest Stretch --  3 min   Chest Stretch Limitations hooklying on 1/2 foam roll                  PT Short Term Goals - 10/09/14 1153    PT SHORT TERM GOAL #1   Title Independent with initial HEP (10/12/14)   Status Achieved           PT Long Term Goals - 10/27/14 1416    PT LONG TERM GOAL #1   Title Independent with advanced HEP as indicated (11/09/14)   Status On-going   PT LONG TERM GOAL #2   Title Patient will demonstrate increased cervical extension ROM by 20 degrees, and remaining movements by 5 degrees to allow for functional ROM of neck (11/09/14)   Status On-going   PT LONG TERM GOAL #3   Title Patient will tolerate sitting for 30 minutes or greater without increased pain while watching TV or working on computer (11/09/14)   Status On-going   PT LONG TERM GOAL #4    Title Patient will demonstrate or at least verbalize awareness of neutral neck and shoulder posture (11/09/14)   Status On-going               Plan - 10/27/14 1411    Clinical Impression Statement Patient did not like TENS and has not used it since last PT visit. Patient slower to accomodate to positional stretch on 1/2 foam roll today but eventually able to tolerate position with only slim pillow under head. Reinforced neutral spine posture with shoulders back and ears over shoulders with need for carryover into daily activities such as reading watching TV. Will  attempt to progress to exercises in prone/POE at next visit.   PT Next Visit Plan Manual therapy including STM as tolerated, postural training, cervical & UE stretches/exercises, cervical isometrics/progression of strengthening as tolerated (attempt prone/POE), modalities PRN   Consulted and Agree with Plan of Care Patient        Problem List Patient Active Problem List   Diagnosis Date Noted  . 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 Anemia 06/08/2014  . Dysuria 01/07/2014  . RLS (restless legs syndrome) 11/23/2013  . Elevated troponin 11/13/2013  . Chest pain 11/13/2013  . Diabetic peripheral neuropathy 10/29/2013  . Left-sided thoracic back pain 10/06/2013  . Encounter for preventative adult health care exam with abnormal findings 09/14/2013  . Iron deficiency anemia   . Status post laparoscopy 02/25/2013  . GERD (gastroesophageal reflux disease) 01/09/2013  . Amaurosis fugax of left eye 10/16/2012  . Low back pain 06/03/2012  . Vitamin D deficiency 03/11/2012  . Bilateral hand pain 10/20/2011  . Encounter for long-term (current) use of other medications 10/20/2011  . IBS (irritable bowel syndrome) 08/14/2011  . TIA (transient ischemic attack) 02/10/2011  . Abnormal brain MRI 01/19/2011  . Allergic rhinitis 10/01/2010  . 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 FUNDIC GLAND  POLYPS OF STOMACH 03/18/2010  . EPIGASTRIC PAIN chronic 03/18/2010  . ARTHRALGIA 03/17/2009  . SYSTOLIC MURMUR 16/02/930  . Paroxysmal supraventricular tachycardia 01/12/2009  . PLANTAR FASCIITIS 06/08/2008  . CHEST PAIN 05/18/2008  . Gastroparesis 12/18/2007  . HYPERCHOLESTEROLEMIA 06/11/2007  . Diabetes mellitus type 2 in obese 09/05/2006  . Essential hypertension 09/05/2006    Percival Spanish, PT, MPT 10/27/2014, 2:20 PM  [facility] [address]  [place], [place], [address] Phone: [phone]   Fax:  [phone]

## 2014-10-27 NOTE — Telephone Encounter (Signed)
  Follow up Call-  Call back number 10/26/2014 08/13/2013 06/14/2012  Post procedure Call Back phone  # [phone]  Permission to leave phone message Yes Yes Yes     Patient questions:  Do you have a fever, pain , or abdominal swelling? No. Pain Score  0 *  Have you tolerated food without any problems? Yes.    Have you been able to return to your normal activities? No.  Do you have any questions about your discharge instructions: Diet   No. Medications  No. Follow up visit  No.  Do you have questions or concerns about your Care? No.  Actions: * If pain score is 4 or above: No action needed, pain <4.

## 2014-10-28 ENCOUNTER — Other Ambulatory Visit: Payer: Self-pay | Admitting: Cardiology

## 2014-10-28 ENCOUNTER — Other Ambulatory Visit: Payer: Self-pay | Admitting: Family Medicine

## 2014-10-29 ENCOUNTER — Telehealth: Payer: Self-pay | Admitting: Internal Medicine

## 2014-10-29 NOTE — Telephone Encounter (Signed)
Patient notified that the pathology was negative for h pylori and that I will call her once Dr. Carlean Purl has reviewed and has a plan

## 2014-10-30 ENCOUNTER — Ambulatory Visit: Payer: Medicare Other | Admitting: Physical Therapy

## 2014-10-30 DIAGNOSIS — G8929 Other chronic pain: Secondary | ICD-10-CM | POA: Diagnosis not present

## 2014-10-30 DIAGNOSIS — M5010 Cervical disc disorder with radiculopathy, unspecified cervical region: Secondary | ICD-10-CM | POA: Diagnosis not present

## 2014-10-30 DIAGNOSIS — M542 Cervicalgia: Secondary | ICD-10-CM

## 2014-10-30 NOTE — Therapy (Signed)
[facility] [address]  [place], [place], [address] Phone: [phone]   Fax:  [phone]  Physical Therapy Treatment  Patient Details  Name: [name] MRN: [id number] Date of Birth: 04-11-1940 Referring Provider:  Penni Bombard, MD  Encounter Date: 10/30/2014      PT End of Session - 10/30/14 1032    Visit Number 12   Number of Visits 16   Date for PT Re-Evaluation 11/09/14   PT Start Time 1019   PT Stop Time 1101   PT Time Calculation (min) 42 min   Activity Tolerance Patient tolerated treatment well   Behavior During Therapy [facility] for tasks assessed/performed      Past Medical History  Diagnosis Date  . Gastroparesis   . Pure hypercholesterolemia   . Headache(784.0)   . Unspecified essential hypertension   . Esophageal reflux   . Type II or unspecified type diabetes mellitus without mention of complication, not stated as uncontrolled   . Arthritis     Spinal Osteoarthritis  . Diverticulosis 08/30/2000    Colonoscopy   . Gastric polyp     Fundic Gland  . Anxiety   . Cataract   . Heart murmur     Echocardiogram 2/11: EF 60-65%, mild LAE, grade 1 diastolic dysfunction, aortic valve sclerosis, mean gradient 9 mm of mercury, PASP 34  . Chest pain     Myoview 12/15 no ischemia.  . Carcinoid tumor of stomach   . PSVT (paroxysmal supraventricular tachycardia)   . Leg swelling     bilateral  . Chronic kidney disease     Left kidney smaller than right kidney  . Cancer   . PONV (postoperative nausea and vomiting)     pt states only needs small amount of anesthesia  . Stroke     tia, 2014  . Iron deficiency anemia, unspecified   . Encounter for preventative adult health care exam with abnormal findings 09/14/2013  . Unspecified hereditary and idiopathic peripheral neuropathy 10/29/2013  . Diabetic peripheral neuropathy 10/29/2013  . Diabetes mellitus type 2 in obese 09/05/2006   Qualifier: Diagnosis of  By: Marca Ancona RMA, Lucy    . Anemia 06/08/2014  . 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 Cholecystectomy  1993  . Tonsillectomy    . Colonoscopy  11/11/2010    diverticulosis  . Esophagogastroduodenoscopy  08/29/2010; 09/15/2010    Carcinoid tumor less than 1 cm in July 2012 not seen in August 2012 , gastritis, fundic gland polyps  . Eus  12/15/2010    Procedure: UPPER ENDOSCOPIC ULTRASOUND (EUS) LINEAR;  Surgeon: Owens Loffler, MD;  Location: [facility];  Service: Endoscopy;  Laterality: N/A;  . Esophagogastroduodenoscopy  05/16/2011  . Esophagogastroduodenoscopy  06/14/2012  . Dilatation & currettage/hysteroscopy with resectocope N/A 02/25/2013    Procedure: Attempted hysteroscopy with uterine perforation;  Surgeon: Jamey Reas de Berton Lan, MD;  Location: [facility];  Service: Gynecology;  Laterality: N/A;  . Laparoscopy N/A 02/25/2013    Procedure: Cystoscopy and laparoscopy with fulguration of uterine serosa;  Surgeon: Jamey Reas de Berton Lan, MD;  Location: [facility];  Service: Gynecology;  Laterality: N/A;    There were no vitals filed for this visit.  Visit Diagnosis:  Neck pain of over 3 months duration  Neck pain on left side  Cervical disc disorder with radiculopathy of cervical region  Subjective Assessment - 10/30/14 1023    Subjective Patient complaining of increased abdominal discomfort today and asked to therapy without laying down today. States neck pain is less constant and more intermittent these days.   Currently in Pain? Yes   Pain Score 5    Pain Location Neck            [facility] PT Assessment - 10/30/14 1019    ROM / Strength   AROM / PROM / Strength AROM   AROM   Overall AROM Comments Bilateral shoulder ROM symmetrical and WFL with flexion limited to ~145 dg; pain on right side of neck with cervical ROM   AROM Assessment Site Cervical   Cervical  Flexion 52   Cervical Extension 38   Cervical - Right Side Bend 32   Cervical - Left Side Bend 33   Cervical - Right Rotation 62   Cervical - Left Rotation 63   Strength   Overall Strength Comments No pain with MMT   Strength Assessment Site Shoulder   Right/Left Shoulder Right;Left   Right Shoulder Flexion 4/5   Right Shoulder ABduction 4/5   Right Shoulder Internal Rotation 4/5   Right Shoulder External Rotation 4/5   Left Shoulder Flexion 4/5   Left Shoulder ABduction 4/5   Left Shoulder Internal Rotation 4/5   Left Shoulder External Rotation 4/5                     [facility] Adult PT Treatment/Exercise - 10/30/14 1019    Exercises   Exercises Neck   Neck Exercises: Theraband   Rows 10 reps;Red   Rows Limitations Seated   Shoulder External Rotation 10 reps;Green   Shoulder External Rotation Limitations Standing against 1/2 foam roll on wall   Horizontal ABduction 15 reps;Green   Horizontal ABduction Limitations Standing against 1/2 foam roll on wall   Neck Exercises: Standing   Wall Push Ups 10 reps   Upper Extremity D2 Flexion;10 reps;Theraband  bilateral   Theraband Level (UE D2) Level 1 (Yellow)   Other Standing Exercises Pball flexion roll up wall with eyes following ball for increased cervical extension x10   Neck Exercises: Seated   Neck Retraction 10 reps;3 secs   Cervical Rotation Both;10 reps   Other Seated Exercise Bilateral diagnonal x10   Other Seated Exercise Single UE crossbody row with green TB x10, bilateral                   PT Short Term Goals - 10/09/14 1153    PT SHORT TERM GOAL #1   Title Independent with initial HEP (10/12/14)   Status Achieved           PT Long Term Goals - 10/30/14 1109    PT LONG TERM GOAL #1   Title Independent with advanced HEP as indicated (11/09/14)   Status On-going   PT LONG TERM GOAL #2   Title Patient will demonstrate increased cervical extension ROM by 20 degrees, and remaining movements  by 5 degrees to allow for functional ROM of neck (11/09/14)   Status Achieved   PT LONG TERM GOAL #3   Title Patient will tolerate sitting for 30 minutes or greater without increased pain while watching TV or working on computer (11/09/14)   Status On-going   PT LONG TERM GOAL #4   Title Patient will demonstrate or at least verbalize awareness of neutral neck and shoulder posture (11/09/14)   Status On-going  Plan - 10/30/14 1205    Clinical Impression Statement Supine exercises deferred today at patient's request secondary to c/o abdominal discomfort, therefore exercises progressed to seated and standing exercises with good patient tolerance although repeated cues necessary to maintain good upright neck and shoulder posture. Cervical ROM essentially unchanged today but improved UE ROM without pain and left shoulder strength now symmetrical to right without pain on application of resistance.   PT Next Visit Plan Manual therapy including STM as tolerated, postural training, cervical & UE stretches/exercises, cervical isometrics/progression of strengthening as tolerated (continue upright exercises and attempt prone/POE), modalities PRN   Consulted and Agree with Plan of Care Patient        Problem List Patient Active Problem List   Diagnosis Date Noted  . 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 Anemia 06/08/2014  . Dysuria 01/07/2014  . RLS (restless legs syndrome) 11/23/2013  . Elevated troponin 11/13/2013  . Chest pain 11/13/2013  . Diabetic peripheral neuropathy 10/29/2013  . Left-sided thoracic back pain 10/06/2013  . Encounter for preventative adult health care exam with abnormal findings 09/14/2013  . Iron deficiency anemia   . Status post laparoscopy 02/25/2013  . GERD (gastroesophageal reflux disease) 01/09/2013  . Amaurosis fugax of left eye 10/16/2012  . Low back pain 06/03/2012  . Vitamin D deficiency 03/11/2012  . Bilateral hand  pain 10/20/2011  . Encounter for long-term (current) use of other medications 10/20/2011  . IBS (irritable bowel syndrome) 08/14/2011  . TIA (transient ischemic attack) 02/10/2011  . Abnormal brain MRI 01/19/2011  . Allergic rhinitis 10/01/2010  . 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 FUNDIC GLAND POLYPS OF STOMACH 03/18/2010  . EPIGASTRIC PAIN chronic 03/18/2010  . ARTHRALGIA 03/17/2009  . SYSTOLIC MURMUR 62/95/2841  . Paroxysmal supraventricular tachycardia 01/12/2009  . PLANTAR FASCIITIS 06/08/2008  . CHEST PAIN 05/18/2008  . Gastroparesis 12/18/2007  . HYPERCHOLESTEROLEMIA 06/11/2007  . Diabetes mellitus type 2 in obese 09/05/2006  . Essential hypertension 09/05/2006    Percival Spanish, PT, MPT 10/30/2014, 12:12 PM  [facility] [address]  [place], [place], [address] Phone: [phone]   Fax:  [phone]

## 2014-11-03 ENCOUNTER — Encounter: Payer: Self-pay | Admitting: Internal Medicine

## 2014-11-03 ENCOUNTER — Ambulatory Visit: Payer: Medicare Other | Admitting: Physical Therapy

## 2014-11-03 DIAGNOSIS — M542 Cervicalgia: Secondary | ICD-10-CM

## 2014-11-03 DIAGNOSIS — M5010 Cervical disc disorder with radiculopathy, unspecified cervical region: Secondary | ICD-10-CM

## 2014-11-03 DIAGNOSIS — G8929 Other chronic pain: Secondary | ICD-10-CM | POA: Diagnosis not present

## 2014-11-03 NOTE — Therapy (Signed)
[facility] [address]  [place], [place], [address] Phone: [phone]   Fax:  [phone]  Physical Therapy Treatment  Patient Details  Name: [name] MRN: [id number] Date of Birth: Jul 19, 1940 Referring Provider:  Penni Bombard, MD  Encounter Date: 11/03/2014      PT End of Session - 11/03/14 1358    Visit Number 13   Number of Visits 16   Date for PT Re-Evaluation 11/09/14   PT Start Time 1700   PT Stop Time 1445   PT Time Calculation (min) 51 min   Activity Tolerance Patient tolerated treatment well   Behavior During Therapy [facility] for tasks assessed/performed      Past Medical History  Diagnosis Date  . Gastroparesis   . Pure hypercholesterolemia   . Headache(784.0)   . Unspecified essential hypertension   . Esophageal reflux   . Type II or unspecified type diabetes mellitus without mention of complication, not stated as uncontrolled   . Arthritis     Spinal Osteoarthritis  . Diverticulosis 08/30/2000    Colonoscopy   . Gastric polyp     Fundic Gland  . Anxiety   . Cataract   . Heart murmur     Echocardiogram 2/11: EF 60-65%, mild LAE, grade 1 diastolic dysfunction, aortic valve sclerosis, mean gradient 9 mm of mercury, PASP 34  . Chest pain     Myoview 12/15 no ischemia.  . Carcinoid tumor of stomach   . PSVT (paroxysmal supraventricular tachycardia)   . Leg swelling     bilateral  . Chronic kidney disease     Left kidney smaller than right kidney  . Cancer   . PONV (postoperative nausea and vomiting)     pt states only needs small amount of anesthesia  . Stroke     tia, 2014  . Iron deficiency anemia, unspecified   . Encounter for preventative adult health care exam with abnormal findings 09/14/2013  . Unspecified hereditary and idiopathic peripheral neuropathy 10/29/2013  . Diabetic peripheral neuropathy 10/29/2013  . Diabetes mellitus type 2 in obese 09/05/2006   Qualifier: Diagnosis of  By: Marca Ancona RMA, Lucy    . Anemia 06/08/2014  . 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 Cholecystectomy  1993  . Tonsillectomy    . Colonoscopy  11/11/2010    diverticulosis  . Esophagogastroduodenoscopy  08/29/2010; 09/15/2010    Carcinoid tumor less than 1 cm in July 2012 not seen in August 2012 , gastritis, fundic gland polyps  . Eus  12/15/2010    Procedure: UPPER ENDOSCOPIC ULTRASOUND (EUS) LINEAR;  Surgeon: Owens Loffler, MD;  Location: [facility];  Service: Endoscopy;  Laterality: N/A;  . Esophagogastroduodenoscopy  05/16/2011  . Esophagogastroduodenoscopy  06/14/2012  . Dilatation & currettage/hysteroscopy with resectocope N/A 02/25/2013    Procedure: Attempted hysteroscopy with uterine perforation;  Surgeon: Jamey Reas de Berton Lan, MD;  Location: [facility];  Service: Gynecology;  Laterality: N/A;  . Laparoscopy N/A 02/25/2013    Procedure: Cystoscopy and laparoscopy with fulguration of uterine serosa;  Surgeon: Jamey Reas de Berton Lan, MD;  Location: [facility];  Service: Gynecology;  Laterality: N/A;    There were no vitals filed for this visit.  Visit Diagnosis:  Neck pain of over 3 months duration  Neck pain on left side  Cervical disc disorder with radiculopathy of cervical region  Subjective Assessment - 11/03/14 1356    Subjective Patient reporting no problems after transition to more upright exercises at last visit, with pain decreased today.   Currently in Pain? Yes   Pain Score 4    Pain Location Neck                         [facility] Adult PT Treatment/Exercise - 11/03/14 1354    Exercises   Exercises Neck   Neck Exercises: Machines for Strengthening   UBE (Upper Arm Bike) lvl 1 fwd/back 1' each   Cybex Row Low Row 15# x10   Neck Exercises: Theraband   Shoulder Extension 15 reps;Green   Shoulder Extension Limitations Standing   Rows  15 reps;Green   Rows Limitations Standing   Shoulder External Rotation 15 reps;Green   Shoulder External Rotation Limitations Standing against 1/2 foam roll on wall   Horizontal ABduction 15 reps;Green   Horizontal ABduction Limitations Standing against 1/2 foam roll on wall   Neck Exercises: Standing   Wall Push Ups 10 reps   Upper Extremity D2 Flexion;10 reps;Theraband  bilateral   Theraband Level (UE D2) Level 1 (Yellow)   Other Standing Exercises Pball flexion roll up wall with eyes following ball for increased cervical extension x10   Neck Exercises: Seated   Other Seated Exercise Single UE crossbody row in standing with green TB x10, bilateral    Neck Exercises: Prone   Other Prone Exercise POE nek diagonals x10 bilateral   Other Prone Exercise Fwd lean on red Pball - UE "T" x10   Neck Exercises: Stretches   Warehouse manager 20 seconds;3 reps                PT Education - 11/03/14 1506    Education provided Yes   Education Details Updated HEP - Standing TB exercises   Person(s) Educated Patient   Methods Explanation;Demonstration;Handout   Comprehension Verbalized understanding;Returned demonstration;Need further instruction          PT Short Term Goals - 10/09/14 1153    PT SHORT TERM GOAL #1   Title Independent with initial HEP (10/12/14)   Status Achieved           PT Long Term Goals - 10/30/14 1109    PT LONG TERM GOAL #1   Title Independent with advanced HEP as indicated (11/09/14)   Status On-going   PT LONG TERM GOAL #2   Title Patient will demonstrate increased cervical extension ROM by 20 degrees, and remaining movements by 5 degrees to allow for functional ROM of neck (11/09/14)   Status Achieved   PT LONG TERM GOAL #3   Title Patient will tolerate sitting for 30 minutes or greater without increased pain while watching TV or working on computer (11/09/14)   Status On-going   PT LONG TERM GOAL #4   Title Patient will demonstrate or at least  verbalize awareness of neutral neck and shoulder posture (11/09/14)   Status On-going               Plan - 11/03/14 1507    Clinical Impression Statement Patient tolerating progression to standing/upright therapy program last visit and today with no new c/o or increase in pain. Updated HEP to reflect exercise progression. Patient with improved awareness of posture when performing exercises, but still reverts to forward flexed posture between exercises. Nearing end of initial cert period, therefore discussed readiness for discharge vs need for recert with patient,  with final decision to be made at next visit.   PT Next Visit Plan recert vs discharge with HEP   Consulted and Agree with Plan of Care Patient        Problem List Patient Active Problem List   Diagnosis Date Noted  . 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 Anemia 06/08/2014  . Dysuria 01/07/2014  . RLS (restless legs syndrome) 11/23/2013  . Elevated troponin 11/13/2013  . Chest pain 11/13/2013  . Diabetic peripheral neuropathy 10/29/2013  . Left-sided thoracic back pain 10/06/2013  . Encounter for preventative adult health care exam with abnormal findings 09/14/2013  . Iron deficiency anemia   . Status post laparoscopy 02/25/2013  . GERD (gastroesophageal reflux disease) 01/09/2013  . Amaurosis fugax of left eye 10/16/2012  . Low back pain 06/03/2012  . Vitamin D deficiency 03/11/2012  . Bilateral hand pain 10/20/2011  . Encounter for long-term (current) use of other medications 10/20/2011  . IBS (irritable bowel syndrome) 08/14/2011  . TIA (transient ischemic attack) 02/10/2011  . Abnormal brain MRI 01/19/2011  . Allergic rhinitis 10/01/2010  . 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 FUNDIC GLAND POLYPS OF STOMACH 03/18/2010  . EPIGASTRIC PAIN chronic 03/18/2010  . ARTHRALGIA 03/17/2009  . SYSTOLIC MURMUR 79/15/0569  . Paroxysmal supraventricular tachycardia 01/12/2009  .  PLANTAR FASCIITIS 06/08/2008  . CHEST PAIN 05/18/2008  . Gastroparesis 12/18/2007  . HYPERCHOLESTEROLEMIA 06/11/2007  . Diabetes mellitus type 2 in obese 09/05/2006  . Essential hypertension 09/05/2006    Percival Spanish, PT, MPT 11/03/2014, 3:12 PM  [facility] [address]  [place], [place], [address] Phone: [phone]   Fax:  [phone]

## 2014-11-03 NOTE — Progress Notes (Signed)
Quick Note:  Hyperplastic gastric polyp Benign mucosa Recall egd 2017 ______

## 2014-11-05 ENCOUNTER — Ambulatory Visit: Payer: Medicare Other | Admitting: Physical Therapy

## 2014-11-05 DIAGNOSIS — M542 Cervicalgia: Secondary | ICD-10-CM | POA: Diagnosis not present

## 2014-11-05 DIAGNOSIS — G8929 Other chronic pain: Principal | ICD-10-CM

## 2014-11-05 DIAGNOSIS — M5010 Cervical disc disorder with radiculopathy, unspecified cervical region: Secondary | ICD-10-CM | POA: Diagnosis not present

## 2014-11-05 NOTE — Therapy (Addendum)
[facility] [address]  [place], [place], [address] Phone: [phone]   Fax:  [phone]  Physical Therapy Treatment  Patient Details  Name: [name] MRN: [id number] Date of Birth: September 03, 1940 Referring Provider:  Penni Bombard, MD  Encounter Date: 11/05/2014      PT End of Session - 11/05/14 1324    Visit Number 14   Number of Visits 16   Date for PT Re-Evaluation 11/09/14   PT Start Time 1319   PT Stop Time 1358   PT Time Calculation (min) 39 min   Activity Tolerance Patient tolerated treatment well   Behavior During Therapy [facility] for tasks assessed/performed      Past Medical History  Diagnosis Date  . Gastroparesis   . Pure hypercholesterolemia   . Headache(784.0)   . Unspecified essential hypertension   . Esophageal reflux   . Type II or unspecified type diabetes mellitus without mention of complication, not stated as uncontrolled   . Arthritis     Spinal Osteoarthritis  . Diverticulosis 08/30/2000    Colonoscopy   . Gastric polyp     Fundic Gland  . Anxiety   . Cataract   . Heart murmur     Echocardiogram 2/11: EF 60-65%, mild LAE, grade 1 diastolic dysfunction, aortic valve sclerosis, mean gradient 9 mm of mercury, PASP 34  . Chest pain     Myoview 12/15 no ischemia.  . Carcinoid tumor of stomach   . PSVT (paroxysmal supraventricular tachycardia)   . Leg swelling     bilateral  . Chronic kidney disease     Left kidney smaller than right kidney  . Cancer   . PONV (postoperative nausea and vomiting)     pt states only needs small amount of anesthesia  . Stroke     tia, 2014  . Iron deficiency anemia, unspecified   . Encounter for preventative adult health care exam with abnormal findings 09/14/2013  . Unspecified hereditary and idiopathic peripheral neuropathy 10/29/2013  . Diabetic peripheral neuropathy 10/29/2013  . Diabetes mellitus type 2 in obese 09/05/2006   Qualifier: Diagnosis of  By: Marca Ancona RMA, Lucy    . Anemia 06/08/2014  . 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 Cholecystectomy  1993  . Tonsillectomy    . Colonoscopy  11/11/2010    diverticulosis  . Esophagogastroduodenoscopy  08/29/2010; 09/15/2010    Carcinoid tumor less than 1 cm in July 2012 not seen in August 2012 , gastritis, fundic gland polyps  . Eus  12/15/2010    Procedure: UPPER ENDOSCOPIC ULTRASOUND (EUS) LINEAR;  Surgeon: Owens Loffler, MD;  Location: [facility];  Service: Endoscopy;  Laterality: N/A;  . Esophagogastroduodenoscopy  05/16/2011  . Esophagogastroduodenoscopy  06/14/2012  . Dilatation & currettage/hysteroscopy with resectocope N/A 02/25/2013    Procedure: Attempted hysteroscopy with uterine perforation;  Surgeon: Jamey Reas de Berton Lan, MD;  Location: [facility];  Service: Gynecology;  Laterality: N/A;  . Laparoscopy N/A 02/25/2013    Procedure: Cystoscopy and laparoscopy with fulguration of uterine serosa;  Surgeon: Jamey Reas de Berton Lan, MD;  Location: [facility];  Service: Gynecology;  Laterality: N/A;    There were no vitals filed for this visit.  Visit Diagnosis:  Neck pain of over 3 months duration  Neck pain on left side      Subjective Assessment - 11/05/14  1322    Subjective Patient reports pain "very low" today. Patient reports doing new HEP yesterday without problems.   Currently in Pain? Yes   Pain Score 4    Pain Location Neck            [facility] PT Assessment - 11/05/14 1319    Observation/Other Assessments   Focus on Therapeutic Outcomes (FOTO)  63% (37% limitation)   ROM / Strength   AROM / PROM / Strength AROM;Strength   AROM   AROM Assessment Site Cervical   Cervical Flexion 52   Cervical Extension 39   Cervical - Right Side Bend 34   Cervical - Left Side Bend 34   Cervical - Right Rotation 70   Cervical - Left Rotation 67   Strength    Overall Strength Comments No pain with MMT   Strength Assessment Site Shoulder   Right/Left Shoulder Right;Left   Right Shoulder Flexion 4/5   Right Shoulder ABduction 4/5   Right Shoulder Internal Rotation 4/5   Right Shoulder External Rotation 4/5   Left Shoulder Flexion 4/5   Left Shoulder ABduction 4/5   Left Shoulder Internal Rotation 4/5   Left Shoulder External Rotation 4/5                     [facility] Adult PT Treatment/Exercise - 11/05/14 1319    Exercises   Exercises Neck   Neck Exercises: Machines for Strengthening   UBE (Upper Arm Bike) lvl 1 fwd/back 2' each   Neck Exercises: Theraband   Shoulder Extension 15 reps;Green   Shoulder Extension Limitations Standing   Rows 15 reps;Green   Rows Limitations Standing   Shoulder External Rotation 15 reps;Green   Shoulder External Rotation Limitations Standing against 1/2 foam roll on wall   Horizontal ABduction 15 reps;Green   Horizontal ABduction Limitations Seated   Neck Exercises: Standing   Upper Extremity D2 Flexion;10 reps;Theraband  bilateral   Theraband Level (UE D2) Level 1 (Yellow)                  PT Short Term Goals - 10/09/14 1153    PT SHORT TERM GOAL #1   Title Independent with initial HEP (10/12/14)   Status Achieved           PT Long Term Goals - 11/05/14 1327    PT LONG TERM GOAL #1   Title Independent with advanced HEP as indicated (11/09/14)   Status Achieved   PT LONG TERM GOAL #2   Title Patient will demonstrate increased cervical extension ROM by 20 degrees, and remaining movements by 5 degrees to allow for functional ROM of neck (11/09/14)   Status Achieved   PT LONG TERM GOAL #3   Title Patient will tolerate sitting for 30 minutes or greater without increased pain while watching TV or working on computer (11/09/14)   Status Achieved   PT LONG TERM GOAL #4   Title Patient will demonstrate or at least verbalize awareness of neutral neck and shoulder posture (11/09/14)    Status Achieved               Plan - 11/05/14 1643    Clinical Impression Statement Patient has demonstrated good progress with PT. Cervical ROM essentially WFL with remaining deficits appearing to be fixed limitations due to fixed forward flexed cervical posture. Bilateral UE ROM WFL and strength grossly 4/5 with no pain during movement or when resistance applied. Patient pleased with progress with therapy  with patient reporting recent pain has been "very low" despite average pain scale ratings of 4/10 with minimal pain during normal daily activities such as watching TV or driving. Patient demonstrating good awareness of neutral neck and shoulder posture within available range, but reports still having to correct self frequently throughout the day. Patient is independent with HEP and aware of need to continue postural exercises and stretches to prevent recurrence of symptoms. Patient concerned about relapse without regular PT visits, therefore will place patient on hold x 30 days, with patient to call for appt if problems arise. If no need to return after 30 days, will proceed with discharge from PT.   PT Next Visit Plan Hold x 30 days   Consulted and Agree with Plan of Care Patient          G-Codes - 11/19/2014 1658    Functional Assessment Tool Used FOTO = 63% (37% limitation)   Functional Limitation Changing and maintaining body position   Changing and Maintaining Body Position Current Status (Q2229) At least 20 percent but less than 40 percent impaired, limited or restricted   Changing and Maintaining Body Position Goal Status (N9892) At least 40 percent but less than 60 percent impaired, limited or restricted   Changing and Maintaining Body Position Discharge Status (J1941) At least 20 percent but less than 40 percent impaired, limited or restricted      Problem List Patient Active Problem List   Diagnosis Date Noted  . 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  Anemia 06/08/2014  . Dysuria 01/07/2014  . RLS (restless legs syndrome) 11/23/2013  . Elevated troponin 11/13/2013  . Chest pain 11/13/2013  . Diabetic peripheral neuropathy 10/29/2013  . Left-sided thoracic back pain 10/06/2013  . Encounter for preventative adult health care exam with abnormal findings 09/14/2013  . Iron deficiency anemia   . Status post laparoscopy 02/25/2013  . GERD (gastroesophageal reflux disease) 01/09/2013  . Amaurosis fugax of left eye 10/16/2012  . Low back pain 06/03/2012  . Vitamin D deficiency 03/11/2012  . Bilateral hand pain 10/20/2011  . Encounter for long-term (current) use of other medications 10/20/2011  . IBS (irritable bowel syndrome) 08/14/2011  . TIA (transient ischemic attack) 02/10/2011  . Abnormal brain MRI 01/19/2011  . Allergic rhinitis 10/01/2010  . 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 FUNDIC GLAND POLYPS OF STOMACH 03/18/2010  . EPIGASTRIC PAIN chronic 03/18/2010  . ARTHRALGIA 03/17/2009  . SYSTOLIC MURMUR 74/09/1446  . Paroxysmal supraventricular tachycardia 01/12/2009  . PLANTAR FASCIITIS 06/08/2008  . CHEST PAIN 05/18/2008  . Gastroparesis 12/18/2007  . HYPERCHOLESTEROLEMIA 06/11/2007  . Diabetes mellitus type 2 in obese 09/05/2006  . Essential hypertension 09/05/2006    Percival Spanish, PT, MPT 11-19-2014, 5:01 PM  [facility] [address]  [place], [place], [address] Phone: [phone]   Fax:  [phone]     PHYSICAL THERAPY DISCHARGE SUMMARY  Visits from Start of Care: 14  Current functional level related to goals / functional outcomes:  As of last visit, patient had demonstrated good progress with PT, with all PT goals met. Cervical ROM was essentially [facility] with remaining deficits appearing to be fixed limitations due to fixed forward flexed cervical posture. Bilateral UE ROM WFL and strength grossly 4/5 with no pain during movement or  when resistance applied. Patient was pleased with progress with therapy with patient reporting recent pain had been "very low", despite average  pain scale ratings of 4/10, with minimal pain during normal daily activities such as watching TV or driving. Patient demonstrating good awareness of neutral neck and shoulder posture within available range, but reports still having to correct self frequently throughout the day. Patient was independent with HEP and aware of need to continue postural exercises and stretches to prevent recurrence of symptoms. Patient was concerned about relapse without regular PT visits, therefore was placed on hold x 30 days, with patient to call for appt if problems arise. No further appointments scheduled within the last 30 days, will proceed with discharge from PT for this episode.   Remaining deficits:  Fixed postural deformity, mild pain in neck   Education / Equipment:  HEP Plan: Patient agrees to discharge.  Patient goals were met. Patient is being discharged due to meeting the stated rehab goals.  ?????       Percival Spanish, PT, MPT 12/07/2014, 2:40 PM  [facility] [address]  [place], [place], [address] Phone: [phone]   Fax:  [phone]

## 2014-11-06 ENCOUNTER — Other Ambulatory Visit (INDEPENDENT_AMBULATORY_CARE_PROVIDER_SITE_OTHER): Payer: Medicare Other

## 2014-11-06 DIAGNOSIS — E559 Vitamin D deficiency, unspecified: Secondary | ICD-10-CM

## 2014-11-06 DIAGNOSIS — E119 Type 2 diabetes mellitus without complications: Secondary | ICD-10-CM

## 2014-11-06 DIAGNOSIS — E7800 Pure hypercholesterolemia, unspecified: Secondary | ICD-10-CM

## 2014-11-06 DIAGNOSIS — I10 Essential (primary) hypertension: Secondary | ICD-10-CM | POA: Diagnosis not present

## 2014-11-06 LAB — LIPID PANEL
CHOL/HDL RATIO: 3
CHOLESTEROL: 218 mg/dL — AB (ref 0–200)
HDL: 66.8 mg/dL (ref >39.00)
LDL CALC: 119 mg/dL — AB (ref 0–99)
NonHDL: 150.87
Triglycerides: 157 mg/dL — ABNORMAL HIGH (ref 0.0–149.0)
VLDL: 31.4 mg/dL (ref 0.0–40.0)

## 2014-11-06 LAB — CBC
HCT: 41.4 % (ref 36.0–46.0)
Hemoglobin: 13.8 g/dL (ref 12.0–15.0)
MCHC: 33.3 g/dL (ref 30.0–36.0)
MCV: 87.6 fl (ref 78.0–100.0)
Platelets: 216 10*3/uL (ref 150.0–400.0)
RBC: 4.72 Mil/uL (ref 3.87–5.11)
RDW: 12.4 % (ref 11.5–15.5)
WBC: 7.2 10*3/uL (ref 4.0–10.5)

## 2014-11-06 LAB — HEMOGLOBIN A1C: Hgb A1c MFr Bld: 6.2 % (ref 4.6–6.5)

## 2014-11-06 LAB — COMPREHENSIVE METABOLIC PANEL
ALBUMIN: 4.5 g/dL (ref 3.5–5.2)
ALT: 25 U/L (ref 0–35)
AST: 23 U/L (ref 0–37)
Alkaline Phosphatase: 55 U/L (ref 39–117)
BUN: 12 mg/dL (ref 6–23)
CHLORIDE: 99 meq/L (ref 96–112)
CO2: 28 meq/L (ref 19–32)
CREATININE: 0.71 mg/dL (ref 0.40–1.20)
Calcium: 10.1 mg/dL (ref 8.4–10.5)
GFR: 85.62 mL/min (ref >60.00)
Glucose, Bld: 126 mg/dL — ABNORMAL HIGH (ref 70–99)
Potassium: 3.9 mEq/L (ref 3.5–5.1)
SODIUM: 139 meq/L (ref 135–145)
Total Bilirubin: 0.7 mg/dL (ref 0.2–1.2)
Total Protein: 7.8 g/dL (ref 6.0–8.3)

## 2014-11-06 LAB — TSH: TSH: 1.34 u[IU]/mL (ref 0.35–4.50)

## 2014-11-06 LAB — VITAMIN D 25 HYDROXY (VIT D DEFICIENCY, FRACTURES): VITD: 58.12 ng/mL (ref 30.00–100.00)

## 2014-11-09 ENCOUNTER — Telehealth: Payer: Self-pay | Admitting: Family Medicine

## 2014-11-09 NOTE — Telephone Encounter (Signed)
Pt is waiting for lab results and said they aren't on my chart yet. She is asking for a call with results. Best # [phone].

## 2014-11-09 NOTE — Telephone Encounter (Signed)
Patient informed of results.  

## 2014-11-13 ENCOUNTER — Encounter: Payer: Self-pay | Admitting: Family Medicine

## 2014-11-13 ENCOUNTER — Ambulatory Visit (INDEPENDENT_AMBULATORY_CARE_PROVIDER_SITE_OTHER): Payer: Medicare Other | Admitting: Family Medicine

## 2014-11-13 VITALS — BP 128/72 | HR 66 | Temp 98.5°F | Ht 61.0 in | Wt 177.0 lb

## 2014-11-13 DIAGNOSIS — E669 Obesity, unspecified: Secondary | ICD-10-CM

## 2014-11-13 DIAGNOSIS — R8299 Other abnormal findings in urine: Secondary | ICD-10-CM | POA: Diagnosis not present

## 2014-11-13 DIAGNOSIS — E119 Type 2 diabetes mellitus without complications: Secondary | ICD-10-CM

## 2014-11-13 DIAGNOSIS — I6523 Occlusion and stenosis of bilateral carotid arteries: Secondary | ICD-10-CM

## 2014-11-13 DIAGNOSIS — E1169 Type 2 diabetes mellitus with other specified complication: Secondary | ICD-10-CM

## 2014-11-13 DIAGNOSIS — R8290 Unspecified abnormal findings in urine: Secondary | ICD-10-CM

## 2014-11-13 DIAGNOSIS — E559 Vitamin D deficiency, unspecified: Secondary | ICD-10-CM

## 2014-11-13 DIAGNOSIS — E7800 Pure hypercholesterolemia, unspecified: Secondary | ICD-10-CM

## 2014-11-13 DIAGNOSIS — I10 Essential (primary) hypertension: Secondary | ICD-10-CM | POA: Diagnosis not present

## 2014-11-13 DIAGNOSIS — K219 Gastro-esophageal reflux disease without esophagitis: Secondary | ICD-10-CM

## 2014-11-13 DIAGNOSIS — Z23 Encounter for immunization: Secondary | ICD-10-CM | POA: Diagnosis not present

## 2014-11-13 DIAGNOSIS — E785 Hyperlipidemia, unspecified: Secondary | ICD-10-CM

## 2014-11-13 DIAGNOSIS — D509 Iron deficiency anemia, unspecified: Secondary | ICD-10-CM | POA: Diagnosis not present

## 2014-11-13 LAB — MICROALBUMIN / CREATININE URINE RATIO
CREATININE, U: 55.2 mg/dL
MICROALB/CREAT RATIO: 2.9 mg/g (ref 0.0–30.0)
Microalb, Ur: 1.6 mg/dL (ref 0.0–1.9)

## 2014-11-13 LAB — POCT URINALYSIS DIPSTICK
BILIRUBIN UA: NEGATIVE
GLUCOSE UA: NEGATIVE
Ketones, UA: NEGATIVE
Leukocytes, UA: NEGATIVE
Nitrite, UA: NEGATIVE
PH UA: 6
Protein, UA: NEGATIVE
SPEC GRAV UA: 1.02
Urobilinogen, UA: 0.2

## 2014-11-13 LAB — GLUCOSE, POCT (MANUAL RESULT ENTRY): POC Glucose: 138 mg/dl — AB (ref 70–99)

## 2014-11-13 MED ORDER — ROSUVASTATIN CALCIUM 10 MG PO TABS: 10.0000 mg | ORAL_TABLET | Freq: Every day | ORAL | Status: DC

## 2014-11-13 MED ORDER — METFORMIN HCL ER 500 MG PO TB24: 500.0000 mg | ORAL_TABLET | Freq: Three times a day (TID) | ORAL | Status: DC

## 2014-11-13 MED ORDER — ZOSTER VACCINE LIVE 19400 UNT/0.65ML ~~LOC~~ SOLR: 0.6500 mL | Freq: Once | SUBCUTANEOUS | Status: DC

## 2014-11-13 NOTE — Progress Notes (Signed)
[name], female   DOB: 1940-02-23, 74 y.o.   MRN: [id number]   Subjective:    [name], female    DOB: Jan 20, 1941, 74 y.o.   MRN: [id number]  Chief Complaint  Patient presents with  . Follow-up    HPI Patient is in today for follow-up. She is generally doing well but she is noting some trouble with nasal congestion and pressure and left ear over the last few weeks. No fevers or chills. No sore throat or headache. Continues to struggle with some mild constipation and slow transit. NO bloody or tarry stool. Denies CP/palp/SOB/HA/fevers or GU c/o. Taking meds as prescribed  Past Medical History  Diagnosis Date  . Gastroparesis   . Pure hypercholesterolemia   . Headache(784.0)   . Unspecified essential hypertension   . Esophageal reflux   . Type II or unspecified type diabetes mellitus without mention of complication, not stated as uncontrolled   . Arthritis     Spinal Osteoarthritis  . Diverticulosis 08/30/2000    Colonoscopy   . Gastric polyp     Fundic Gland  . Anxiety   . Cataract   . Heart murmur     Echocardiogram 2/11: EF 60-65%, mild LAE, grade 1 diastolic dysfunction, aortic valve sclerosis, mean gradient 9 mm of mercury, PASP 34  . Chest pain     Myoview 12/15 no ischemia.  . Carcinoid tumor of stomach   . PSVT (paroxysmal supraventricular tachycardia) ([place])   . Leg swelling     bilateral  . Chronic kidney disease     Left kidney smaller than right kidney  . Cancer ([place])   . PONV (postoperative nausea and vomiting)     pt states only needs small amount of anesthesia  . Stroke ([place])     tia, 2014  . Iron deficiency anemia, unspecified   . Encounter for preventative adult health care exam with abnormal findings 09/14/2013  . Unspecified hereditary and idiopathic peripheral neuropathy 10/29/2013  . Diabetic peripheral neuropathy ([place]) 10/29/2013  . Diabetes mellitus type 2 in obese ([place]) 09/05/2006    Qualifier: Diagnosis of   By: Marca Ancona RMA, Lucy    . Anemia 06/08/2014  . 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 Cholecystectomy  1993  . Tonsillectomy    . Colonoscopy  11/11/2010    diverticulosis  . Esophagogastroduodenoscopy  08/29/2010; 09/15/2010    Carcinoid tumor less than 1 cm in July 2012 not seen in August 2012 , gastritis, fundic gland polyps  . Eus  12/15/2010    Procedure: UPPER ENDOSCOPIC ULTRASOUND (EUS) LINEAR;  Surgeon: Owens Loffler, MD;  Location: [facility];  Service: Endoscopy;  Laterality: N/A;  . Esophagogastroduodenoscopy  05/16/2011  . Esophagogastroduodenoscopy  06/14/2012  . Dilatation & currettage/hysteroscopy with resectocope N/A 02/25/2013    Procedure: Attempted hysteroscopy with uterine perforation;  Surgeon: Jamey Reas de Berton Lan, MD;  Location: [facility];  Service: Gynecology;  Laterality: N/A;  . Laparoscopy N/A 02/25/2013    Procedure: Cystoscopy and laparoscopy with fulguration of uterine serosa;  Surgeon: Jamey Reas de Berton Lan, MD;  Location: [facility];  Service: Gynecology;  Laterality: N/A;    Family History  Problem Relation Age of Onset  . Diabetes Mother   . Stroke Father     deceased age 61  . Heart disease Sister     deceased MI age 63  .  Heart disease Brother     deceased MI age 20  . Colon cancer Neg Hx   . Esophageal cancer Neg Hx   . Stomach cancer Neg Hx   . Rectal cancer Neg Hx   . Diabetes Maternal Grandmother   . Hypertension Paternal Grandmother   . Diabetes Sister   . Heart disease Sister   . Hypertension Sister   . Hyperlipidemia Sister   . Diabetes Sister   . Heart disease Sister   . Hypertension Sister   . Hyperlipidemia Sister   . Diabetes Brother   . Heart disease Brother   . Hypertension Brother   . Hyperlipidemia Brother     Social History   Social History  . Marital Status: Married    Spouse Name: N/A  . Number of Children: 0  . Years of  Education: college   Occupational History  . Retail    Social History Main Topics  . Smoking status: Never Smoker   . Smokeless tobacco: Never Used     Comment: Never used tobacco  . Alcohol Use: No  . Drug Use: No  . Sexual Activity: No     Comment: lives alone, no dietary restrictions except avoid fresh veg, fruit, whole grains   Other Topics Concern  . Not on file   Social History Narrative   Patient was married ([name]) - widow   Patient does not have any children.   Patient is right-handed.   Patient has a BA degree.   One caffeine drink daily     Outpatient Prescriptions Prior to Visit  Medication Sig Dispense Refill  . acetaminophen (TYLENOL) 500 MG tablet Take 2 tablets (1,000 mg total) by mouth every 6 (six) hours as needed for moderate pain. 30 tablet 0  . amLODipine (NORVASC) 5 MG tablet Take 1 tablet by mouth  daily 90 tablet 1  . aspirin 81 MG tablet Take 81 mg by mouth daily.    . calcium-vitamin D (OSCAL WITH D) 500-200 MG-UNIT per tablet Take 1 tablet by mouth.    . cholecalciferol (VITAMIN D) 1000 UNITS tablet Take 1,000 Units by mouth daily.      Marland Kitchen dicyclomine (BENTYL) 10 MG capsule Take 10 mg by mouth 4 (four) times daily -  before meals and at bedtime.    Marland Kitchen esomeprazole (NEXIUM) 40 MG capsule Take 1 capsule (40 mg total) by mouth daily before breakfast. 90 capsule 3  . FOLBIC 2.5-25-2 MG TABS TAKE ONE TABLET BY MOUTH EVERY DAY 90 tablet 3  . glucose blood test strip Use as directed once daily to check blood sugar.  DX E11.9 100 each 3  . Lancets MISC Use as instructed once daily to check blood sugar. DX E11.9 200 each 2  . metFORMIN (GLUCOPHAGE-XR) 500 MG 24 hr tablet Take 2 tablets by mouth two times daily 360 tablet 1  . metoCLOPramide (REGLAN) 5 MG tablet Take 1 tablet (5 mg total) by mouth 2 (two) times daily. 180 tablet 1  . metoprolol (LOPRESSOR) 50 MG tablet Take 1 tablet (50 mg total) by mouth 2 (two) times   daily. 180 tablet 3  . Multiple Vitamin  (MULTIVITAMIN) tablet Take 1 tablet by mouth daily.      . ondansetron (ZOFRAN) 4 MG tablet Take 4 mg by mouth every 4 (four) hours as needed. For nausea      . potassium chloride (K-DUR,KLOR-CON) 10 MEQ tablet Take 1 tablet by mouth  daily 60 tablet 2  . rosuvastatin (  CRESTOR) 10 MG tablet Take 0.5-1 tablets (5-10 mg total) by mouth daily. 1 WHOLE TABLET 4 DAYS A WEEK AND I/2 TABLET 3 DAYS A WEEK 45 tablet 0  . valsartan (DIOVAN) 320 MG tablet Take 1 tablet by mouth  daily 90 tablet 2  . vitamin E 400 UNIT capsule Take 400 Units by mouth daily.    . metFORMIN (GLUCOPHAGE-XR) 500 MG 24 hr tablet Take 2 tablets (1,000 mg total) by mouth 2 (two) times daily. 180 tablet 1  . potassium chloride (K-DUR,KLOR-CON) 10 MEQ tablet Take 1 tablet (10 mEq total) by mouth daily. 90 tablet 3   No facility-administered medications prior to visit.    Allergies  Allergen Reactions  . Tramadol     Dizziness     Review of Systems  Constitutional: Negative for fever and malaise/fatigue.  HENT: Positive for congestion.   Eyes: Negative for discharge.  Respiratory: Negative for shortness of breath.   Cardiovascular: Negative for chest pain, palpitations and leg swelling.  Gastrointestinal: Negative for nausea and abdominal pain.  Genitourinary: Negative for dysuria.  Musculoskeletal: Negative for falls.  Skin: Negative for rash.  Neurological: Negative for loss of consciousness and headaches.  Endo/Heme/Allergies: Negative for environmental allergies.  Psychiatric/Behavioral: Negative for depression. The patient is not nervous/anxious.        Objective:    Physical Exam  Constitutional: She is oriented to person, place, and time. She appears well-developed and well-nourished. No distress.  HENT:  Head: Normocephalic and atraumatic.  Nose: Nose normal.  Eyes: Right eye exhibits no discharge. Left eye exhibits no discharge.  Neck: Normal range of motion. Neck supple.  Cardiovascular: Normal rate  and regular rhythm.   No murmur heard. Pulmonary/Chest: Effort normal and breath sounds normal.  Abdominal: Soft. Bowel sounds are normal. There is no tenderness.  Musculoskeletal: She exhibits no edema.  Neurological: She is alert and oriented to person, place, and time.  Skin: Skin is warm and dry.  Psychiatric: She has a normal mood and affect.  Nursing note and vitals reviewed.   BP 128/72 mmHg  Pulse 66  Temp(Src) 98.5 F (36.9 C) (Oral)  Ht 5\' 1"  (1.549 m)  Wt 177 lb (80.287 kg)  BMI 33.46 kg/m2  SpO2 95%  LMP 02/07/1992 Wt Readings from Last 3 Encounters:  11/13/14 177 lb (80.287 kg)  10/26/14 176 lb (79.833 kg)  10/22/14 176 lb (79.833 kg)     Lab Results  Component Value Date   WBC 7.2 11/06/2014   HGB 13.8 11/06/2014   HCT 41.4 11/06/2014   PLT 216.0 11/06/2014   GLUCOSE 126* 11/06/2014   CHOL 218* 11/06/2014   TRIG 157.0* 11/06/2014   HDL 66.80 11/06/2014   LDLDIRECT 123.7 04/13/2011   LDLCALC 119* 11/06/2014   ALT 25 11/06/2014   AST 23 11/06/2014   NA 139 11/06/2014   K 3.9 11/06/2014   CL 99 11/06/2014   CREATININE 0.71 11/06/2014   BUN 12 11/06/2014   CO2 28 11/06/2014   TSH 1.34 11/06/2014   INR 0.88 01/16/2011   HGBA1C 6.2 11/06/2014   MICROALBUR 1.01 03/18/2013    Lab Results  Component Value Date   TSH 1.34 11/06/2014   Lab Results  Component Value Date   WBC 7.2 11/06/2014   HGB 13.8 11/06/2014   HCT 41.4 11/06/2014   MCV 87.6 11/06/2014   PLT 216.0 11/06/2014   Lab Results  Component Value Date   NA 139 11/06/2014   K 3.9 11/06/2014  CO2 28 11/06/2014   GLUCOSE 126* 11/06/2014   BUN 12 11/06/2014   CREATININE 0.71 11/06/2014   BILITOT 0.7 11/06/2014   ALKPHOS 55 11/06/2014   AST 23 11/06/2014   ALT 25 11/06/2014   PROT 7.8 11/06/2014   ALBUMIN 4.5 11/06/2014   CALCIUM 10.1 11/06/2014   ANIONGAP 16* 11/13/2013   GFR 85.62 11/06/2014   Lab Results  Component Value Date   CHOL 218* 11/06/2014   Lab Results    Component Value Date   HDL 66.80 11/06/2014   Lab Results  Component Value Date   LDLCALC 119* 11/06/2014   Lab Results  Component Value Date   TRIG 157.0* 11/06/2014   Lab Results  Component Value Date   CHOLHDL 3 11/06/2014   Lab Results  Component Value Date   HGBA1C 6.2 11/06/2014       Assessment & Plan:   Problem List Items Addressed This Visit    None    Visit Diagnoses    Encounter for immunization    -  Primary       I am having Ms. Bouyer maintain her multivitamin, cholecalciferol, ondansetron, acetaminophen, aspirin, calcium-vitamin D, metoprolol, esomeprazole, metoCLOPramide, FOLBIC, rosuvastatin, valsartan, potassium chloride, glucose blood, vitamin E, dicyclomine, Lancets, amLODipine, and metFORMIN.  No orders of the defined types were placed in this encounter.     Elizabeth Sauer, LPN

## 2014-11-13 NOTE — Patient Instructions (Addendum)
Flonase (fluticasone) 2 sprays each side of the nose in the morning and normal saline rinse at night.  JOBST compression stockings found in stores  Basic Carbohydrate Counting for Diabetes Mellitus Carbohydrate counting is a method for keeping track of the amount of carbohydrates you eat. Eating carbohydrates naturally increases the level of sugar (glucose) in your blood, so it is important for you to know the amount that is okay for you to have in every meal. Carbohydrate counting helps keep the level of glucose in your blood within normal limits. The amount of carbohydrates allowed is different for every person. A dietitian can help you calculate the amount that is right for you. Once you know the amount of carbohydrates you can have, you can count the carbohydrates in the foods you want to eat. Carbohydrates are found in the following foods:  Grains, such as breads and cereals.  Dried beans and soy products.  Starchy vegetables, such as potatoes, peas, and corn.  Fruit and fruit juices.  Milk and yogurt.  Sweets and snack foods, such as cake, cookies, candy, chips, soft drinks, and fruit drinks. CARBOHYDRATE COUNTING There are two ways to count the carbohydrates in your food. You can use either of the methods or a combination of both. Reading the "Nutrition Facts" on [place] The "Nutrition Facts" is an area that is included on the labels of almost all packaged food and beverages in the [place]. It includes the serving size of that food or beverage and information about the nutrients in each serving of the food, including the grams (g) of carbohydrate per serving.  Decide the number of servings of this food or beverage that you will be able to eat or drink. Multiply that number of servings by the number of grams of carbohydrate that is listed on the label for that serving. The total will be the amount of carbohydrates you will be having when you eat or drink this food or  beverage. Learning Standard Serving Sizes of Food When you eat food that is not packaged or does not include "Nutrition Facts" on the label, you need to measure the servings in order to count the amount of carbohydrates.A serving of most carbohydrate-rich foods contains about 15 g of carbohydrates. The following list includes serving sizes of carbohydrate-rich foods that provide 15 g ofcarbohydrate per serving:   1 slice of bread (1 oz) or 1 six-inch tortilla.    of a hamburger bun or English muffin.  4-6 crackers.   cup unsweetened dry cereal.    cup hot cereal.   cup rice or pasta.    cup mashed potatoes or  of a large baked potato.  1 cup fresh fruit or one small piece of fruit.    cup canned or frozen fruit or fruit juice.  1 cup milk.   cup plain fat-free yogurt or yogurt sweetened with artificial sweeteners.   cup cooked dried beans or starchy vegetable, such as peas, corn, or potatoes.  Decide the number of standard-size servings that you will eat. Multiply that number of servings by 15 (the grams of carbohydrates in that serving). For example, if you eat 2 cups of strawberries, you will have eaten 2 servings and 30 g of carbohydrates (2 servings x 15 g = 30 g). For foods such as soups and casseroles, in which more than one food is mixed in, you will need to count the carbohydrates in each food that is included. EXAMPLE OF CARBOHYDRATE COUNTING Sample Dinner  3 oz chicken breast.   cup of brown rice.   cup of corn.  1 cup milk.   1 cup strawberries with sugar-free whipped topping.  Carbohydrate Calculation Step 1: Identify the foods that contain carbohydrates:   Rice.   Corn.   Milk.   Strawberries. Step 2:Calculate the number of servings eaten of each:   2 servings of rice.   1 serving of corn.   1 serving of milk.   1 serving of strawberries. Step 3: Multiply each of those number of servings by 15 g:   2 servings of  rice x 15 g = 30 g.   1 serving of corn x 15 g = 15 g.   1 serving of milk x 15 g = 15 g.   1 serving of strawberries x 15 g = 15 g. Step 4: Add together all of the amounts to find the total grams of carbohydrates eaten: 30 g + 15 g + 15 g + 15 g = 75 g.   This information is not intended to replace advice given to you by your health care provider. Make sure you discuss any questions you have with your health care provider.   Document Released: 01/23/2005 Document Revised: 02/13/2014 Document Reviewed: 12/20/2012 Elsevier Interactive Patient Education [employer].

## 2014-11-13 NOTE — Progress Notes (Signed)
Pre visit review using our clinic review tool, if applicable. No additional management support is needed unless otherwise documented below in the visit note. 

## 2014-11-14 LAB — URINE CULTURE: Colony Count: 5000

## 2014-11-21 DIAGNOSIS — R8290 Unspecified abnormal findings in urine: Secondary | ICD-10-CM | POA: Insufficient documentation

## 2014-11-21 NOTE — Assessment & Plan Note (Signed)
Well controlled, no changes to meds. Encouraged heart healthy diet such as the DASH diet and exercise as tolerated.  °

## 2014-11-21 NOTE — Assessment & Plan Note (Signed)
Well treated. No change in supplements

## 2014-11-21 NOTE — Assessment & Plan Note (Signed)
Encouraged heart healthy diet, increase exercise, avoid trans fats, consider a krill oil cap daily 

## 2014-11-21 NOTE — Assessment & Plan Note (Signed)
Avoid offending foods, start probiotics. Do not eat large meals in late evening and consider raising head of bed.  

## 2014-11-21 NOTE — Assessment & Plan Note (Signed)
hgba1c acceptable, minimize simple carbs. Increase exercise as tolerated. Continue current meds 

## 2014-12-07 ENCOUNTER — Ambulatory Visit: Payer: Medicare Other | Admitting: Diagnostic Neuroimaging

## 2014-12-10 ENCOUNTER — Telehealth: Payer: Self-pay | Admitting: Internal Medicine

## 2014-12-10 ENCOUNTER — Ambulatory Visit (INDEPENDENT_AMBULATORY_CARE_PROVIDER_SITE_OTHER): Payer: Medicare Other | Admitting: Physician Assistant

## 2014-12-10 ENCOUNTER — Encounter: Payer: Self-pay | Admitting: Physician Assistant

## 2014-12-10 ENCOUNTER — Other Ambulatory Visit: Payer: Self-pay | Admitting: Cardiology

## 2014-12-10 VITALS — BP 114/60 | HR 60 | Wt 173.4 lb

## 2014-12-10 DIAGNOSIS — K529 Noninfective gastroenteritis and colitis, unspecified: Secondary | ICD-10-CM | POA: Diagnosis not present

## 2014-12-10 DIAGNOSIS — K5732 Diverticulitis of large intestine without perforation or abscess without bleeding: Secondary | ICD-10-CM

## 2014-12-10 DIAGNOSIS — I6523 Occlusion and stenosis of bilateral carotid arteries: Secondary | ICD-10-CM | POA: Diagnosis not present

## 2014-12-10 DIAGNOSIS — K589 Irritable bowel syndrome without diarrhea: Secondary | ICD-10-CM

## 2014-12-10 MED ORDER — DICYCLOMINE HCL 10 MG PO CAPS: 10.0000 mg | ORAL_CAPSULE | Freq: Four times a day (QID) | ORAL | Status: DC | PRN

## 2014-12-10 MED ORDER — CIPROFLOXACIN HCL 500 MG PO TABS: ORAL_TABLET | ORAL | Status: DC

## 2014-12-10 MED ORDER — ESOMEPRAZOLE MAGNESIUM 40 MG PO CPDR: 40.0000 mg | DELAYED_RELEASE_CAPSULE | Freq: Every day | ORAL | Status: DC

## 2014-12-10 NOTE — Telephone Encounter (Signed)
Patient reports LLQ pain for 2 days.  Pain is now radiating toward her umbilicus.  She does have a history of diverticulosis.  She will come in today and see Amy Esterwood PA at 3:00

## 2014-12-10 NOTE — Patient Instructions (Signed)
We sent prescriptions to [facility], [employer]. 1. Cipro Antibiotic 2. Bentyl ( dicyclomine )

## 2014-12-10 NOTE — Progress Notes (Signed)
[name], female   DOB: [birth year], 74 y.o.   MRN: [id number]   Subjective:    [name], female    DOB: 11-Aug-1940, 74 y.o.   MRN: [id number]  HPI  [name] is a pleasant 74 year old female known to Dr. Carlean Purl who comes in today with complaints of 3-4 day history of left lower quadrant pain. She last had colonoscopy in October 2012 showing severe pandiverticulosis, no polyps. Was done in September 2016 with finding of multiple diminutive gastric polyps and antral gastritis. Biopsy shows hyperplastic gastric polyps and mild chronic active gastritis. Patient says she has problems with diarrhea almost every morning after she takes her medications. She says this usually stops by lunchtime and she hasn't bothered the rest of the day. She had a bad episode of diarrhea 3-4 days ago and then started hurting in her left lower quadrant. This is becoming rather constant in the left lower quadrant radiating across her lower abdomen. She has not noticed any change with by mouth intake or bowel movements no increase with movement. She has no urinary symptoms. Denies any fever or chills no nausea or vomiting no says she's afraid to eat. She has not had a bowel movement today. She is chronically on dicyclomine 3-4 times daily as needed and also takes metoclopramide twice daily for history of gastroparesis. She is on Glucophage for her diabetes 1500 mg a day.  Review of Systems Pertinent positive and negative review of systems were noted in the above HPI section.  All other review of systems was otherwise negative.  Outpatient Encounter Prescriptions as of 12/10/2014  Medication Sig  . acetaminophen (TYLENOL) 500 MG tablet Take 2 tablets (1,000 mg total) by mouth every 6 (six) hours as needed for moderate pain.  Marland Kitchen amLODipine (NORVASC) 5 MG tablet Take 1 tablet by mouth  daily  . aspirin 81 MG tablet Take 81 mg by mouth daily.  . calcium-vitamin D (OSCAL WITH D) 500-200  MG-UNIT per tablet Take 1 tablet by mouth.  . cholecalciferol (VITAMIN D) 1000 UNITS tablet Take 1,000 Units by mouth daily.    Marland Kitchen dicyclomine (BENTYL) 10 MG capsule Take 1 capsule (10 mg total) by mouth 4 (four) times daily as needed.  Marland Kitchen esomeprazole (NEXIUM) 40 MG capsule Take 1 capsule (40 mg total) by mouth daily before breakfast.  . FOLBIC 2.5-25-2 MG TABS TAKE ONE TABLET BY MOUTH EVERY DAY  . glucose blood test strip Use as directed once daily to check blood sugar.  DX E11.9  . Lancets MISC Use as instructed once daily to check blood sugar. DX E11.9  . metFORMIN (GLUCOPHAGE-XR) 500 MG 24 hr tablet Take 1 tablet (500 mg total) by mouth 3 (three) times daily with meals.  . metoCLOPramide (REGLAN) 5 MG tablet Take 1 tablet (5 mg total) by mouth 2 (two) times daily.  . metoprolol (LOPRESSOR) 50 MG tablet Take 1 tablet by mouth two  times daily  . Multiple Vitamin (MULTIVITAMIN) tablet Take 1 tablet by mouth daily.    . ondansetron (ZOFRAN) 4 MG tablet Take 4 mg by mouth every 4 (four) hours as needed. For nausea    . potassium chloride (K-DUR,KLOR-CON) 10 MEQ tablet Take 1 tablet by mouth  daily  . rosuvastatin (CRESTOR) 10 MG tablet Take 1 tablet (10 mg total) by mouth daily.  . valsartan (DIOVAN) 320 MG tablet Take 1 tablet by mouth  daily  . vitamin E 400 UNIT capsule Take 400 Units by  mouth daily.  Marland Kitchen zoster vaccine live, PF, (ZOSTAVAX) 87681 UNT/0.65ML injection Inject 19,400 Units into the skin once.  . [DISCONTINUED] dicyclomine (BENTYL) 10 MG capsule Take 10 mg by mouth 4 (four) times daily as needed.  . [DISCONTINUED] dicyclomine (BENTYL) 10 MG capsule Take 1 capsule (10 mg total) by mouth 4 (four) times daily as needed.  . [DISCONTINUED] esomeprazole (NEXIUM) 40 MG capsule Take 1 capsule (40 mg total) by mouth daily before breakfast.  . ciprofloxacin (CIPRO) 500 MG tablet Take 1 tab twice daily with food.  . [DISCONTINUED] ciprofloxacin (CIPRO) 500 MG tablet Take 1 tab twice daily  with food.  . [DISCONTINUED] metoprolol (LOPRESSOR) 50 MG tablet Take 1 tablet (50 mg total) by mouth 2 (two) times   daily.   No facility-administered encounter medications on file as of 12/10/2014.   Allergies  Allergen Reactions  . Tramadol     Dizziness    Patient Active Problem List   Diagnosis Date Noted  . Abnormal urine 11/21/2014  . 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 Anemia 06/08/2014  . Dysuria 01/07/2014  . RLS (restless legs syndrome) 11/23/2013  . Elevated troponin 11/13/2013  . Chest pain 11/13/2013  . Diabetic peripheral neuropathy ([place]) 10/29/2013  . Left-sided thoracic back pain 10/06/2013  . Encounter for preventative adult health care exam with abnormal findings 09/14/2013  . Iron deficiency anemia   . Status post laparoscopy 02/25/2013  . GERD (gastroesophageal reflux disease) 01/09/2013  . Amaurosis fugax of left eye 10/16/2012  . Low back pain 06/03/2012  . Vitamin D deficiency 03/11/2012  . Bilateral hand pain 10/20/2011  . Encounter for long-term (current) use of other medications 10/20/2011  . IBS (irritable bowel syndrome) 08/14/2011  . TIA (transient ischemic attack) 02/10/2011  . Abnormal brain MRI 01/19/2011  . Allergic rhinitis 10/01/2010  . 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 FUNDIC GLAND POLYPS OF STOMACH 03/18/2010  . EPIGASTRIC PAIN chronic 03/18/2010  . ARTHRALGIA 03/17/2009  . SYSTOLIC MURMUR 15/72/6203  . Paroxysmal supraventricular tachycardia ([place]) 01/12/2009  . PLANTAR FASCIITIS 06/08/2008  . CHEST PAIN 05/18/2008  . Gastroparesis 12/18/2007  . HYPERCHOLESTEROLEMIA 06/11/2007  . Diabetes mellitus type 2 in obese ([place]) 09/05/2006  . Essential hypertension 09/05/2006   Social History   Social History  . Marital Status: Married    Spouse Name: N/A  . Number of Children: 0  . Years of Education: college   Occupational History  . Retail    Social History Main Topics  . Smoking  status: Never Smoker   . Smokeless tobacco: Never Used     Comment: Never used tobacco  . Alcohol Use: No  . Drug Use: No  . Sexual Activity: No     Comment: lives alone, no dietary restrictions except avoid fresh veg, fruit, whole grains   Other Topics Concern  . Not on file   Social History Narrative   Patient was married ([name]) - widow   Patient does not have any children.   Patient is right-handed.   Patient has a BA degree.   One caffeine drink daily     Ms. Abernethy's family history includes Diabetes in her brother, maternal grandmother, mother, sister, and sister; Heart disease in her brother, brother, sister, sister, and sister; Hyperlipidemia in her brother, sister, and sister; Hypertension in her brother, paternal grandmother, sister, and sister; Stroke in her father. There is no history of Colon cancer, Esophageal cancer, Stomach cancer, or Rectal cancer.  Objective:    Filed Vitals:   12/10/14 1507  BP: 114/60  Pulse: 60    Physical Exam  well-developed older female in no acute distress, pleasant blood pressure 114/60 pulse 60 weight 173. HEENT nontraumatic normocephalic EOMI PERRLA sclera anicteric, Cardiovascular ;regular rate and rhythm with S1-S2 no murmur or gallop, Pulmonary; clear bilaterally, Abdomen; soft, bowel sounds are present, she is tender in the left lower quadrant and suprapubic area there is no guarding or rebound no palpable mass or hepatosplenomegaly up rectal exam not done, Extremities ;no clubbing cyanosis or edema skin warm and dry, Neuropsych; mood and affect appropriate       Assessment & Plan:   #1 74 yo female with 4 day hx of LLQ pain in pt with known severe pan diverticulosis- suspect acute diverticulitis.  #2 IBS-chronic diarrhea- suspect metformin contributing #3 hx gastroparesis #4 gastric polyps- hyperplastic #5 GERD #6 hx of gastric carcinoid  Plan; refill bentyl 10 mg -take one every am then up to 3 x daily prn  Start  Cipro 500 mg po BID x 10 days with food  Soft diet,gradually advance   Pt has upcoming follow up with Dr Carlean Purl  next week   Amy Genia Harold PA-C 12/10/2014   Cc: [name], MD

## 2014-12-14 ENCOUNTER — Encounter: Payer: Self-pay | Admitting: Internal Medicine

## 2014-12-14 ENCOUNTER — Ambulatory Visit (INDEPENDENT_AMBULATORY_CARE_PROVIDER_SITE_OTHER): Payer: Medicare Other | Admitting: Internal Medicine

## 2014-12-14 VITALS — BP 119/62 | HR 68 | Ht 61.0 in | Wt 174.6 lb

## 2014-12-14 DIAGNOSIS — I6523 Occlusion and stenosis of bilateral carotid arteries: Secondary | ICD-10-CM | POA: Diagnosis not present

## 2014-12-14 DIAGNOSIS — K3184 Gastroparesis: Secondary | ICD-10-CM | POA: Diagnosis not present

## 2014-12-14 DIAGNOSIS — K5732 Diverticulitis of large intestine without perforation or abscess without bleeding: Secondary | ICD-10-CM

## 2014-12-14 NOTE — Patient Instructions (Signed)
We are glad your doing better.  Dr Carlean Purl would like to see you in April 2017, call us in February for that appointment.   Stay on your current diet.     I appreciate the opportunity to care for you. Silvano Rusk, MD, [facility]

## 2014-12-14 NOTE — Progress Notes (Signed)
   Subjective:    [name], female    DOB: August 31, 1940, 74 y.o.   MRN: [id number] Cc: f/u diverticulitis  HPI  Seen last week w/ LLA and suprapubic pain and thought to have diverticulitis. Tx cipro and metronidazole and is improving - much less tender and painful. Still using metaclopramide w/o problems. Recent EGD - fundic gland polyps but no carcinoid (had in past) Medications, allergies, past medical history, past surgical history, family history and social history are reviewed and updated in the EMR.  Review of Systems Some fatigue    Objective:   Physical Exam BP 119/62 mmHg  Pulse 68  Ht 5\' 1"  (1.549 m)  Wt 174 lb 9.6 oz (79.198 kg)  BMI 33.01 kg/m2  LMP 02/07/1992 Abd is soft, nontender BS+ No tardive dyskinesia      Assessment & Plan:  Gastroparesis  Diverticulitis of colon   Stay on gastroparesis diet Finish abx See me 6 month routine - call back sooner prn  15 minutes time spent with patient > half in counseling coordination of care

## 2014-12-15 ENCOUNTER — Encounter: Payer: Self-pay | Admitting: Internal Medicine

## 2014-12-15 ENCOUNTER — Telehealth: Payer: Self-pay | Admitting: Internal Medicine

## 2014-12-15 NOTE — Telephone Encounter (Signed)
Patient has congestion and a sore throat since being at the office yesterday.  She states that the pharmacy hand out given to her about the cipro lists sore throat as a side effect.  She wants to know if she should continue the cipro?

## 2014-12-15 NOTE — Telephone Encounter (Signed)
Patient notified She will call back for any GI concerns

## 2014-12-15 NOTE — Telephone Encounter (Signed)
I do not think the cipro is causing that - a lot of viral things going on Finish the cipro call back if other ?'s or changes but see PCP if URI sx help needed further

## 2014-12-16 NOTE — Progress Notes (Signed)
Agree with Ms. Esterwood's assessment and plan. Carl E. Gessner, MD, FACG   

## 2014-12-17 ENCOUNTER — Telehealth: Payer: Self-pay | Admitting: Family Medicine

## 2014-12-17 MED ORDER — BENZONATATE 100 MG PO CAPS: 100.0000 mg | ORAL_CAPSULE | Freq: Three times a day (TID) | ORAL | Status: DC | PRN

## 2014-12-17 NOTE — Telephone Encounter (Signed)
Notified pt of below and that Shirlean Mylar will f/u tomorrow morning.

## 2014-12-17 NOTE — Telephone Encounter (Signed)
Would not add another antibiotic, this might be a viral illness and will just take time. She should take plain Mucinex bid, increase fluid intake, rest and start some zinc like Coldeeze. She can have some Tessalon perles, 100 mg caps 1 cap po tid prn cough, disp #30

## 2014-12-17 NOTE — Telephone Encounter (Signed)
Patient was seen by Dr. Carlean Purl last week and still on antibiotic. Patient is Coughing, sore throat and congestion in her throat/head.  She is concerned it may go to her chest.  She is on Cipro from Dr. Carlean Purl and does not know if another antibiotic can be added onto what she is already taking. She has no fever.  Advise please if OV needed or suggestions for the cough/rx ??

## 2014-12-17 NOTE — Telephone Encounter (Signed)
Called the patient on both cell/home number left message to call back. Did go ahead and send in the tessalon perles to her local [facility].

## 2014-12-18 NOTE — Telephone Encounter (Signed)
Called the patient to confirm message was received from yesterday of PCP instructions.  The patient stated she did receive and would agree to all instructions.

## 2014-12-22 ENCOUNTER — Other Ambulatory Visit: Payer: Self-pay

## 2014-12-22 DIAGNOSIS — Z1231 Encounter for screening mammogram for malignant neoplasm of breast: Secondary | ICD-10-CM

## 2014-12-23 ENCOUNTER — Telehealth: Payer: Self-pay | Admitting: Obstetrics and Gynecology

## 2014-12-23 DIAGNOSIS — R9389 Abnormal findings on diagnostic imaging of other specified body structures: Secondary | ICD-10-CM

## 2014-12-23 NOTE — Telephone Encounter (Signed)
Spoke with patient. Patient states that she is due for a follow up ultrasound in January. "It hurts a lot when have it and a lot after. I am not having any bloating or pain. I want to know if I need this or if I can do something else. It hurts so much." Patient had last PUS on 02/12/2014. Recommended yearly ultrasound for thickened endometrium. Advised of importance of follow up yearly through PUS. Advised I will speak with Dr.Silva and return call with further recommendations. Patient is agreeable.

## 2014-12-23 NOTE — Telephone Encounter (Signed)
Spoke with patient. Advised of message as seen below from [place]. Patient is agreeable and verbalizes understanding. Patient would like to know the cost for the ultrasound appointment. Advised I will have our billing department check her benefits and return call with this information. Orders placed. Patient is agreeable and verbalizes understanding.  Cc: Theresia Lo  Routing to [employer] as Juluis Rainier.

## 2014-12-23 NOTE — Telephone Encounter (Signed)
Thank you for the update!

## 2014-12-23 NOTE — Telephone Encounter (Signed)
Patient calling has a question about her ultrasound. She would not tell me what kind of question it was, she wanted to speak with Dr. Elza Rafter nurse. Best # to reach: [phone]

## 2014-12-23 NOTE — Telephone Encounter (Signed)
I would recommend she have the ultrasound.  We can start with the abdominal ultrasound and then proceed to the vaginal ultrasound at the same time, but only if it is not possible to see the lining of the uterus well with the abdominal imaging.  If this ultrasound is stable, we can decide if we need to continue them.  I do recommend at least yearly well woman visits, however.

## 2014-12-28 NOTE — Telephone Encounter (Signed)
Call to patient to review benefit precert process. Patients insurance will renew in January 2017 which will alter benefits. Left voicemail to return call to discuss.

## 2014-12-29 NOTE — Telephone Encounter (Signed)
Patient is returning a call to Becky. °

## 2014-12-29 NOTE — Telephone Encounter (Signed)
Spoke with patient regarding benefits for ultrasound. Patient understands we will contact insurance in January for most up to date benefits for ultrasound. Patient became confused thinking her ultrasound appointment was scheduled 01/20/15. Reviewed appointment scheduled for 01/20/15 is her annual exam with Dr Quincy Simmonds. Patient disagreed stating Dr Quincy Simmonds is doing both. Reviewed that we generally do not perform both during the same visit and our office does not perform ultrasounds on Wednesdays (her appointment date is a Wednesday). Patient still disagreeable about this information stating Dr Quincy Simmonds has done it before. Re-stated that we do not have an ultrasound technician in our office on the day she is scheduled and the recommendation within previous messages notes ultrasound in January 2017. Offered return call from nurse to discuss. Patient agreeable.

## 2014-12-30 NOTE — Telephone Encounter (Signed)
Spoke with patient. Advised patient she has an annual exam appointment scheduled for 01/20/2015 with Dr.Silva. Will need to keep this appointment. Is due to have PUS in January of 2017. This will need to be performed in a separate appointment to ensure enough time for both appointments. Patient is agreeable. Will keep aex appointment as scheduled. Declines to schedule ultrasound at this time. Will schedule at her aex.  Routing to provider for final review. Patient agreeable to disposition. Will close encounter.

## 2015-01-12 DIAGNOSIS — H35353 Cystoid macular degeneration, bilateral: Secondary | ICD-10-CM | POA: Diagnosis not present

## 2015-01-12 DIAGNOSIS — H524 Presbyopia: Secondary | ICD-10-CM | POA: Diagnosis not present

## 2015-01-12 DIAGNOSIS — H26493 Other secondary cataract, bilateral: Secondary | ICD-10-CM | POA: Diagnosis not present

## 2015-01-12 DIAGNOSIS — H04123 Dry eye syndrome of bilateral lacrimal glands: Secondary | ICD-10-CM | POA: Diagnosis not present

## 2015-01-15 ENCOUNTER — Other Ambulatory Visit: Payer: Self-pay | Admitting: Cardiology

## 2015-01-19 DIAGNOSIS — H26491 Other secondary cataract, right eye: Secondary | ICD-10-CM | POA: Diagnosis not present

## 2015-01-20 ENCOUNTER — Ambulatory Visit: Payer: Medicare Other | Admitting: Obstetrics and Gynecology

## 2015-01-22 ENCOUNTER — Other Ambulatory Visit (HOSPITAL_BASED_OUTPATIENT_CLINIC_OR_DEPARTMENT_OTHER): Payer: Medicare Other

## 2015-01-22 ENCOUNTER — Ambulatory Visit (HOSPITAL_BASED_OUTPATIENT_CLINIC_OR_DEPARTMENT_OTHER): Payer: Medicare Other | Admitting: Hematology & Oncology

## 2015-01-22 ENCOUNTER — Telehealth: Payer: Self-pay | Admitting: Emergency Medicine

## 2015-01-22 ENCOUNTER — Telehealth: Payer: Self-pay | Admitting: Obstetrics and Gynecology

## 2015-01-22 ENCOUNTER — Encounter: Payer: Self-pay | Admitting: Hematology & Oncology

## 2015-01-22 VITALS — BP 159/69 | HR 63 | Temp 97.4°F | Wt 173.8 lb

## 2015-01-22 DIAGNOSIS — D508 Other iron deficiency anemias: Secondary | ICD-10-CM | POA: Diagnosis not present

## 2015-01-22 DIAGNOSIS — K909 Intestinal malabsorption, unspecified: Secondary | ICD-10-CM

## 2015-01-22 DIAGNOSIS — D509 Iron deficiency anemia, unspecified: Secondary | ICD-10-CM

## 2015-01-22 DIAGNOSIS — K9089 Other intestinal malabsorption: Secondary | ICD-10-CM | POA: Diagnosis not present

## 2015-01-22 LAB — RETICULOCYTES
ABS Retic: 39.8 10*3/uL (ref 19.0–186.0)
RBC.: 4.42 MIL/uL (ref 3.87–5.11)
Retic Ct Pct: 0.9 % (ref 0.4–2.3)

## 2015-01-22 LAB — CBC WITH DIFFERENTIAL (CANCER CENTER ONLY)
BASO#: 0 10*3/uL (ref 0.0–0.2)
BASO%: 0.4 % (ref 0.0–2.0)
EOS ABS: 0.1 10*3/uL (ref 0.0–0.5)
EOS%: 0.9 % (ref 0.0–7.0)
HCT: 39 % (ref 34.8–46.6)
HGB: 13.3 g/dL (ref 11.6–15.9)
LYMPH#: 2.8 10*3/uL (ref 0.9–3.3)
LYMPH%: 30.1 % (ref 14.0–48.0)
MCH: 29.3 pg (ref 26.0–34.0)
MCHC: 34.1 g/dL (ref 32.0–36.0)
MCV: 86 fL (ref 81–101)
MONO#: 0.6 10*3/uL (ref 0.1–0.9)
MONO%: 6.7 % (ref 0.0–13.0)
NEUT#: 5.8 10*3/uL (ref 1.5–6.5)
NEUT%: 61.9 % (ref 39.6–80.0)
PLATELETS: 232 10*3/uL (ref 145–400)
RBC: 4.54 10*6/uL (ref 3.70–5.32)
RDW: 12.2 % (ref 11.1–15.7)
WBC: 9.4 10*3/uL (ref 3.9–10.0)

## 2015-01-22 NOTE — Telephone Encounter (Signed)
Incoming call from patient. Spoke with front office supervisor, Leeroy Bock, she requested consult regarding her endoscopy results.  Advised if patient would like consult, okay to schedule.  Patient then declined to schedule a consult. Stated she had a personal problem she wanted to speak with Dr. Quincy Simmonds about and no one else, then declined any appointment offered.   Update to Dr. Quincy Simmonds.  Will close encounter.

## 2015-01-22 NOTE — Progress Notes (Signed)
Hematology and Oncology Follow Up Visit  [name] Redfield:[id number] 1940-03-10 74 y.o. 01/22/2015   Principle Diagnosis:   Iron deficiency anemia secondary to malabsorption  Current Therapy:    IV iron-patient received 2 doses back in May 2016     Interim History:  [name] is back for follow-up. She feels better. She has not had any iron now for about 7 months. She is responded very well to the iron that she got back in May.  Her blood sugars are doing okay. She said her last hemoglobin A1c was 6.2.   She's had no obvious bleeding issues. Is no change in bowel or bladder habits.   Her appetite has been good. She's had no dysphagia or odynophagia.   . In any process with leg swelling. She's had no joint issues.   Overall, her performance status is ECOG 1.  Medications:  Current outpatient prescriptions:  .  acetaminophen (TYLENOL) 500 MG tablet, Take 2 tablets (1,000 mg total) by mouth every 6 (six) hours as needed for moderate pain., Disp: 30 tablet, Rfl: 0 .  amLODipine (NORVASC) 5 MG tablet, Take 1 tablet by mouth  daily, Disp: 90 tablet, Rfl: 1 .  aspirin 81 MG tablet, Take 81 mg by mouth daily., Disp: , Rfl:  .  calcium-vitamin D (OSCAL WITH D) 500-200 MG-UNIT per tablet, Take 1 tablet by mouth., Disp: , Rfl:  .  cholecalciferol (VITAMIN D) 1000 UNITS tablet, Take 1,000 Units by mouth daily.  , Disp: , Rfl:  .  dicyclomine (BENTYL) 10 MG capsule, Take 1 capsule (10 mg total) by mouth 4 (four) times daily as needed., Disp: 120 capsule, Rfl: 6 .  esomeprazole (NEXIUM) 40 MG capsule, Take 1 capsule (40 mg total) by mouth daily before breakfast., Disp: 30 capsule, Rfl: 11 .  FOLBIC 2.5-25-2 MG TABS, TAKE ONE TABLET BY MOUTH EVERY DAY, Disp: 90 tablet, Rfl: 3 .  glucose blood test strip, Use as directed once daily to check blood sugar.  DX E11.9, Disp: 100 each, Rfl: 3 .  Lancets MISC, Use as instructed once daily to check blood sugar. DX E11.9, Disp: 200 each, Rfl: 2 .   metFORMIN (GLUCOPHAGE-XR) 500 MG 24 hr tablet, Take 1 tablet (500 mg total) by mouth 3 (three) times daily with meals., Disp: 270 tablet, Rfl: 1 .  metoCLOPramide (REGLAN) 5 MG tablet, Take 1 tablet (5 mg total) by mouth 2 (two) times daily., Disp: 180 tablet, Rfl: 1 .  metoprolol (LOPRESSOR) 50 MG tablet, Take 1 tablet by mouth two  times daily, Disp: 180 tablet, Rfl: 1 .  Multiple Vitamin (MULTIVITAMIN) tablet, Take 1 tablet by mouth daily.  , Disp: , Rfl:  .  ondansetron (ZOFRAN) 4 MG tablet, Take 4 mg by mouth every 4 (four) hours as needed. For nausea  , Disp: , Rfl:  .  potassium chloride (K-DUR,KLOR-CON) 10 MEQ tablet, Take 1 tablet by mouth  daily, Disp: 60 tablet, Rfl: 2 .  rosuvastatin (CRESTOR) 10 MG tablet, Take 1 tablet (10 mg total) by mouth daily., Disp: 90 tablet, Rfl: 1 .  valsartan (DIOVAN) 320 MG tablet, Take 1 tablet by mouth  daily, Disp: 90 tablet, Rfl: 2 .  vitamin E 400 UNIT capsule, Take 400 Units by mouth daily., Disp: , Rfl:  .  zoster vaccine live, PF, (ZOSTAVAX) 29562 UNT/0.65ML injection, Inject 19,400 Units into the skin once., Disp: 1 each, Rfl: 0  Allergies:  Allergies  Allergen Reactions  . Tramadol  Dizziness     Past Medical History, Surgical history, Social history, and Family History were reviewed and updated.  Review of Systems: As above  Physical Exam:  weight is 173 lb 12.8 oz (78.835 kg). Her oral temperature is 97.4 F (36.3 C). Her blood pressure is 159/69 and her pulse is 63.   Wt Readings from Last 3 Encounters:  01/22/15 173 lb 12.8 oz (78.835 kg)  12/14/14 174 lb 9.6 oz (79.198 kg)  12/10/14 173 lb 6 oz (78.642 kg)     Well-developed well-nourished white female in no obvious distress. Head and neck exam shows no ocular or oral lesions. There are no palpable cervical or supraclavicular lymph nodes. Lungs are clear. Cardiac exam regular rate and rhythm with no murmurs, rubs or bruits. Abdomen is soft. She has good bowel sounds.  There is no fluid wave. There is no palpable liver or spleen tip. Back exam shows no tenderness over the spine, ribs or hips. Extremities shows no clubbing, cyanosis or edema. Skin exam shows no rashes, ecchymosis or petechia. Neurological exam shows no focal neurological deficits.  Lab Results  Component Value Date   WBC 9.4 01/22/2015   HGB 13.3 01/22/2015   HCT 39.0 01/22/2015   MCV 86 01/22/2015   PLT 232 01/22/2015     Chemistry      Component Value Date/Time   NA 139 11/06/2014 0847   K 3.9 11/06/2014 0847   CL 99 11/06/2014 0847   CO2 28 11/06/2014 0847   BUN 12 11/06/2014 0847   CREATININE 0.71 11/06/2014 0847   CREATININE 0.71 05/04/2014 0829      Component Value Date/Time   CALCIUM 10.1 11/06/2014 0847   ALKPHOS 55 11/06/2014 0847   AST 23 11/06/2014 0847   ALT 25 11/06/2014 0847   BILITOT 0.7 11/06/2014 0847         Impression and Plan: [name] is 74 year old white female. She has iron deficiency. I suspect that this is malabsorption. She's had stools tested which were negative for blood.  I think at this point, we can have her come back as needed. Her blood cues getting better. We gave her the iron. This really has helped.  I told her that she can always call us to let us know if she is have her blood checked. If so, we can definitely get her back.  She is getting excellent care from Dr. Randel Pigg, so I do still think that we would be adding much to her medical care right now.  As all his, we had good fellowship. She has a very strong faith.Volanda Napoleon, MD 12/16/20162:23 PM. He is also for

## 2015-01-22 NOTE — Telephone Encounter (Signed)
Phone call returned to patient per her request.  I called her cell phone which identified her on her voice mail.   I left a message that I called and asked her to return the call when the office reopens on Monday morning.

## 2015-01-25 ENCOUNTER — Telehealth: Payer: Self-pay | Admitting: *Deleted

## 2015-01-25 ENCOUNTER — Other Ambulatory Visit: Payer: Self-pay | Admitting: *Deleted

## 2015-01-25 ENCOUNTER — Telehealth: Payer: Self-pay | Admitting: Family Medicine

## 2015-01-25 DIAGNOSIS — D538 Other specified nutritional anemias: Secondary | ICD-10-CM

## 2015-01-25 LAB — IRON AND TIBC
%SAT: 17 % — ABNORMAL LOW (ref 21–57)
IRON: 56 ug/dL (ref 41–142)
TIBC: 330 ug/dL (ref 236–444)
UIBC: 274 ug/dL (ref 120–384)

## 2015-01-25 LAB — FERRITIN: FERRITIN: 142 ng/mL (ref 9–269)

## 2015-01-25 NOTE — Telephone Encounter (Signed)
Phone call returned to the patient.   States she was in the office for an appointment but she cancelled. Insurance issues.  Asking questions about well women visit versus problem visits.   Has mammogram tomorrow.   I told patient that she may stop pap smears if she chooses.   I recommended she have breast and pelvic exam at least every 2 years, Medicare guideline.  I recommended she have her follow up pelvic ultrasound and be seen for her yearly evaluation of the endometrium which was not possible to sample in the OR due to cervical stenosis.  Also needs evaluation if develops any postmenopausal bleeding.  These were recommended by GYN ONC.  Patient states she will consider the information she had received today.

## 2015-01-25 NOTE — Telephone Encounter (Signed)
Filled out as much as possible and forwarded to Dr. Charlett Blake. JG//CMA

## 2015-01-25 NOTE — Telephone Encounter (Addendum)
Patient aware of results. Appointment made.  ----- Message from Volanda Napoleon, MD sent at 01/25/2015 12:33 PM EST ----- Call - iron level is actually lower.  She needs 1 dose of feraheme!!!  Please set up!  pete

## 2015-01-25 NOTE — Telephone Encounter (Signed)
Caller name: Wilburn Cornelia with [employer] Can be reached: did not want to give me phone # or fax #  Reason for call: states she is checking if we've received RX for "additional supplies" in beyond test strips and lancets for pt. I advised I see nothing noted. She will refax today to [phone].

## 2015-01-26 ENCOUNTER — Ambulatory Visit
Admission: RE | Admit: 2015-01-26 | Discharge: 2015-01-26 | Disposition: A | Discharge status: Home / Self Care | Payer: Medicare Other | Source: Ambulatory Visit

## 2015-01-26 DIAGNOSIS — Z1231 Encounter for screening mammogram for malignant neoplasm of breast: Secondary | ICD-10-CM | POA: Diagnosis not present

## 2015-02-11 ENCOUNTER — Other Ambulatory Visit (INDEPENDENT_AMBULATORY_CARE_PROVIDER_SITE_OTHER): Payer: Medicare Other

## 2015-02-11 ENCOUNTER — Ambulatory Visit (HOSPITAL_BASED_OUTPATIENT_CLINIC_OR_DEPARTMENT_OTHER): Payer: Medicare Other

## 2015-02-11 VITALS — BP 149/63 | HR 65 | Temp 97.6°F | Resp 18

## 2015-02-11 DIAGNOSIS — D509 Iron deficiency anemia, unspecified: Secondary | ICD-10-CM

## 2015-02-11 DIAGNOSIS — K9089 Other intestinal malabsorption: Secondary | ICD-10-CM

## 2015-02-11 DIAGNOSIS — K909 Intestinal malabsorption, unspecified: Secondary | ICD-10-CM

## 2015-02-11 DIAGNOSIS — E785 Hyperlipidemia, unspecified: Secondary | ICD-10-CM | POA: Diagnosis not present

## 2015-02-11 DIAGNOSIS — I10 Essential (primary) hypertension: Secondary | ICD-10-CM

## 2015-02-11 DIAGNOSIS — E119 Type 2 diabetes mellitus without complications: Secondary | ICD-10-CM

## 2015-02-11 DIAGNOSIS — E7800 Pure hypercholesterolemia, unspecified: Secondary | ICD-10-CM

## 2015-02-11 DIAGNOSIS — E669 Obesity, unspecified: Secondary | ICD-10-CM

## 2015-02-11 DIAGNOSIS — D508 Other iron deficiency anemias: Secondary | ICD-10-CM

## 2015-02-11 DIAGNOSIS — E1169 Type 2 diabetes mellitus with other specified complication: Secondary | ICD-10-CM

## 2015-02-11 LAB — CBC
HEMATOCRIT: 39.5 % (ref 36.0–46.0)
HEMOGLOBIN: 12.9 g/dL (ref 12.0–15.0)
MCHC: 32.8 g/dL (ref 30.0–36.0)
MCV: 86.9 fl (ref 78.0–100.0)
Platelets: 235 10*3/uL (ref 150.0–400.0)
RBC: 4.54 Mil/uL (ref 3.87–5.11)
RDW: 12.5 % (ref 11.5–15.5)
WBC: 7.3 10*3/uL (ref 4.0–10.5)

## 2015-02-11 LAB — LIPID PANEL
CHOLESTEROL: 242 mg/dL — AB (ref 0–200)
HDL: 69.1 mg/dL (ref >39.00)
LDL CALC: 141 mg/dL — AB (ref 0–99)
NonHDL: 172.79
TRIGLYCERIDES: 159 mg/dL — AB (ref 0.0–149.0)
Total CHOL/HDL Ratio: 4
VLDL: 31.8 mg/dL (ref 0.0–40.0)

## 2015-02-11 LAB — COMPREHENSIVE METABOLIC PANEL
ALBUMIN: 4.1 g/dL (ref 3.5–5.2)
ALT: 37 U/L — ABNORMAL HIGH (ref 0–35)
AST: 29 U/L (ref 0–37)
Alkaline Phosphatase: 55 U/L (ref 39–117)
BUN: 13 mg/dL (ref 6–23)
CHLORIDE: 102 meq/L (ref 96–112)
CO2: 26 mEq/L (ref 19–32)
Calcium: 9.7 mg/dL (ref 8.4–10.5)
Creatinine, Ser: 0.61 mg/dL (ref 0.40–1.20)
GFR: 101.94 mL/min (ref >60.00)
Glucose, Bld: 133 mg/dL — ABNORMAL HIGH (ref 70–99)
POTASSIUM: 3.8 meq/L (ref 3.5–5.1)
SODIUM: 138 meq/L (ref 135–145)
Total Bilirubin: 0.6 mg/dL (ref 0.2–1.2)
Total Protein: 7.1 g/dL (ref 6.0–8.3)

## 2015-02-11 LAB — TSH: TSH: 2.32 u[IU]/mL (ref 0.35–4.50)

## 2015-02-11 LAB — HEMOGLOBIN A1C: Hgb A1c MFr Bld: 6.3 % (ref 4.6–6.5)

## 2015-02-11 MED ORDER — FERUMOXYTOL INJECTION 510 MG/17 ML
510.0000 mg | Freq: Once | INTRAVENOUS | Status: AC
  Administered 2015-02-11: 510 mg via INTRAVENOUS
  Filled 2015-02-11: qty 17

## 2015-02-11 MED ORDER — SODIUM CHLORIDE 0.9 % IV SOLN
Freq: Once | INTRAVENOUS | Status: AC
  Administered 2015-02-11: 10:00:00 via INTRAVENOUS

## 2015-02-11 NOTE — Patient Instructions (Signed)

## 2015-02-18 ENCOUNTER — Encounter: Payer: Self-pay | Admitting: Family Medicine

## 2015-02-18 ENCOUNTER — Ambulatory Visit: Payer: Medicare Other | Admitting: Obstetrics and Gynecology

## 2015-02-18 ENCOUNTER — Ambulatory Visit (INDEPENDENT_AMBULATORY_CARE_PROVIDER_SITE_OTHER): Payer: Medicare Other | Admitting: Family Medicine

## 2015-02-18 VITALS — BP 120/72 | HR 69 | Temp 98.2°F | Ht 61.0 in | Wt 174.5 lb

## 2015-02-18 DIAGNOSIS — E669 Obesity, unspecified: Secondary | ICD-10-CM

## 2015-02-18 DIAGNOSIS — E7800 Pure hypercholesterolemia, unspecified: Secondary | ICD-10-CM

## 2015-02-18 DIAGNOSIS — H9202 Otalgia, left ear: Secondary | ICD-10-CM | POA: Diagnosis not present

## 2015-02-18 DIAGNOSIS — K219 Gastro-esophageal reflux disease without esophagitis: Secondary | ICD-10-CM

## 2015-02-18 DIAGNOSIS — E559 Vitamin D deficiency, unspecified: Secondary | ICD-10-CM

## 2015-02-18 DIAGNOSIS — I10 Essential (primary) hypertension: Secondary | ICD-10-CM

## 2015-02-18 DIAGNOSIS — E119 Type 2 diabetes mellitus without complications: Secondary | ICD-10-CM

## 2015-02-18 DIAGNOSIS — E1169 Type 2 diabetes mellitus with other specified complication: Secondary | ICD-10-CM

## 2015-02-18 MED ORDER — GLUCOSE BLOOD VI STRP: ORAL_STRIP | Status: DC

## 2015-02-18 NOTE — Progress Notes (Signed)
[name], female   DOB: 09/27/1940, 75 y.o.   MRN: [id number]   Subjective:    [name], female    DOB: 01-05-1941, 75 y.o.   MRN: [id number]  Chief Complaint  Patient presents with  . Follow-up    HPI Patient is in today for follow-up. She had been doing fairly well but recently has developed some intermittent left ear pain and some tinnitus. No fevers or chills. No congestion or chest pain. No other new or acute complaints.  Denies CP/palp/SOB/HA/congestion/fevers/GI or GU c/o. Taking meds as prescribed  Past Medical History  Diagnosis Date  . Gastroparesis   . Pure hypercholesterolemia   . Headache(784.0)   . Unspecified essential hypertension   . Esophageal reflux   . Type II or unspecified type diabetes mellitus without mention of complication, not stated as uncontrolled   . Arthritis     Spinal Osteoarthritis  . Diverticulosis 08/30/2000    Colonoscopy   . Gastric polyp     Fundic Gland  . Anxiety   . Cataract   . Heart murmur     Echocardiogram 2/11: EF 60-65%, mild LAE, grade 1 diastolic dysfunction, aortic valve sclerosis, mean gradient 9 mm of mercury, PASP 34  . Chest pain     Myoview 12/15 no ischemia.  . Carcinoid tumor of stomach   . PSVT (paroxysmal supraventricular tachycardia) ([place] [address])   . Leg swelling     bilateral  . Chronic kidney disease     Left kidney smaller than right kidney  . Cancer ([place])   . PONV (postoperative nausea and vomiting)     pt states only needs small amount of anesthesia  . Stroke ([place])     tia, 2014  . Iron deficiency anemia, unspecified   . Encounter for preventative adult health care exam with abnormal findings 09/14/2013  . Unspecified hereditary and idiopathic peripheral neuropathy 10/29/2013  . Diabetic peripheral neuropathy ([place]) 10/29/2013  . Diabetes mellitus type 2 in obese ([place]) 09/05/2006    Qualifier: Diagnosis of  By: Marca Ancona RMA, Lucy    . Anemia 06/08/2014  . 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 Cholecystectomy  1993  . Tonsillectomy    . Colonoscopy  11/11/2010    diverticulosis  . Esophagogastroduodenoscopy  08/29/2010; 09/15/2010    Carcinoid tumor less than 1 cm in July 2012 not seen in August 2012 , gastritis, fundic gland polyps  . Eus  12/15/2010    Procedure: UPPER ENDOSCOPIC ULTRASOUND (EUS) LINEAR;  Surgeon: Owens Loffler, MD;  Location: [facility];  Service: Endoscopy;  Laterality: N/A;  . Esophagogastroduodenoscopy  05/16/2011  . Esophagogastroduodenoscopy  06/14/2012  . Dilatation & currettage/hysteroscopy with resectocope N/A 02/25/2013    Procedure: Attempted hysteroscopy with uterine perforation;  Surgeon: Jamey Reas de Berton Lan, MD;  Location: [facility];  Service: Gynecology;  Laterality: N/A;  . Laparoscopy N/A 02/25/2013    Procedure: Cystoscopy and laparoscopy with fulguration of uterine serosa;  Surgeon: Jamey Reas de Berton Lan, MD;  Location: [facility];  Service: Gynecology;  Laterality: N/A;  . Eye surgery Bilateral     Bi lateral cateracts and bi lateral laser    Family History  Problem Relation Age of Onset  . Diabetes Mother   . Stroke Father     deceased age 22  . Heart disease Sister     deceased MI  age 105  . Heart disease Brother     deceased MI age 56  . Colon cancer Neg Hx   . Esophageal cancer Neg Hx   . Stomach cancer Neg Hx   . Rectal cancer Neg Hx   . Diabetes Maternal Grandmother   . Hypertension Paternal Grandmother   . Diabetes Sister   . Heart disease Sister   . Hypertension Sister   . Hyperlipidemia Sister   . Diabetes Sister   . Heart disease Sister   . Hypertension Sister   . Hyperlipidemia Sister   . Diabetes Brother   . Heart disease Brother   . Hypertension Brother   . Hyperlipidemia Brother     Social History   Social History  . Marital Status: Married    Spouse Name: N/A  . Number of Children: 0  . Years  of Education: college   Occupational History  . Retail    Social History Main Topics  . Smoking status: Never Smoker   . Smokeless tobacco: Never Used     Comment: Never used tobacco  . Alcohol Use: No  . Drug Use: No  . Sexual Activity: No     Comment: lives alone, no dietary restrictions except avoid fresh veg, fruit, whole grains   Other Topics Concern  . Not on file   Social History Narrative   Patient was married ([name]) - widow   Patient does not have any children.   Patient is right-handed.   Patient has a BA degree.   One caffeine drink daily     Outpatient Prescriptions Prior to Visit  Medication Sig Dispense Refill  . acetaminophen (TYLENOL) 500 MG tablet Take 2 tablets (1,000 mg total) by mouth every 6 (six) hours as needed for moderate pain. 30 tablet 0  . amLODipine (NORVASC) 5 MG tablet Take 1 tablet by mouth  daily 90 tablet 1  . aspirin 81 MG tablet Take 81 mg by mouth daily.    . calcium-vitamin D (OSCAL WITH D) 500-200 MG-UNIT per tablet Take 1 tablet by mouth.    . cholecalciferol (VITAMIN D) 1000 UNITS tablet Take 1,000 Units by mouth daily.      Marland Kitchen dicyclomine (BENTYL) 10 MG capsule Take 1 capsule (10 mg total) by mouth 4 (four) times daily as needed. 120 capsule 6  . esomeprazole (NEXIUM) 40 MG capsule Take 1 capsule (40 mg total) by mouth daily before breakfast. 30 capsule 11  . FOLBIC 2.5-25-2 MG TABS TAKE ONE TABLET BY MOUTH EVERY DAY 90 tablet 3  . Lancets MISC Use as instructed once daily to check blood sugar. DX E11.9 (Patient taking differently: Use as instructed twice daily to check blood sugar. DX E11.9) 200 each 2  . metFORMIN (GLUCOPHAGE-XR) 500 MG 24 hr tablet Take 1 tablet (500 mg total) by mouth 3 (three) times daily with meals. 270 tablet 1  . Multiple Vitamin (MULTIVITAMIN) tablet Take 1 tablet by mouth daily.      . ondansetron (ZOFRAN) 4 MG tablet Take 4 mg by mouth every 4 (four) hours as needed. For nausea      . potassium chloride  (K-DUR,KLOR-CON) 10 MEQ tablet Take 1 tablet by mouth  daily 60 tablet 2  . rosuvastatin (CRESTOR) 10 MG tablet Take 1 tablet (10 mg total) by mouth daily. 90 tablet 1  . vitamin E 400 UNIT capsule Take 400 Units by mouth daily.    Marland Kitchen zoster vaccine live, PF, (ZOSTAVAX) 82956 UNT/0.65ML injection Inject 19,400 Units  into the skin once. 1 each 0  . glucose blood test strip Use as directed once daily to check blood sugar.  DX E11.9 (Patient taking differently: as needed (Take once daily and as needed). Use as directed twice daily to check blood sugar.  DX E11.9) 100 each 3  . metoCLOPramide (REGLAN) 5 MG tablet Take 1 tablet (5 mg total) by mouth 2 (two) times daily. 180 tablet 1  . metoprolol (LOPRESSOR) 50 MG tablet Take 1 tablet by mouth two  times daily 180 tablet 1  . valsartan (DIOVAN) 320 MG tablet Take 1 tablet by mouth  daily 90 tablet 2   No facility-administered medications prior to visit.    Allergies  Allergen Reactions  . Tramadol     Dizziness     Review of Systems  Constitutional: Negative for fever and malaise/fatigue.  HENT: Positive for ear pain and tinnitus. Negative for congestion.   Eyes: Negative for discharge.  Respiratory: Negative for shortness of breath.   Cardiovascular: Negative for chest pain, palpitations and leg swelling.  Gastrointestinal: Negative for nausea and abdominal pain.  Genitourinary: Negative for dysuria.  Musculoskeletal: Negative for falls.  Skin: Negative for rash.  Neurological: Negative for loss of consciousness and headaches.  Endo/Heme/Allergies: Negative for environmental allergies.  Psychiatric/Behavioral: Negative for depression. The patient is not nervous/anxious.        Objective:    Physical Exam  Constitutional: She is oriented to person, place, and time. She appears well-developed and well-nourished. No distress.  HENT:  Head: Normocephalic and atraumatic.  Nose: Nose normal.  Eyes: Right eye exhibits no discharge. Left  eye exhibits no discharge.  Neck: Normal range of motion. Neck supple.  Cardiovascular: Normal rate and regular rhythm.   No murmur heard. Pulmonary/Chest: Effort normal and breath sounds normal.  Abdominal: Soft. Bowel sounds are normal. There is no tenderness.  Musculoskeletal: She exhibits no edema.  Neurological: She is alert and oriented to person, place, and time.  Skin: Skin is warm and dry.  Psychiatric: She has a normal mood and affect.  Nursing note and vitals reviewed.   BP 120/72 mmHg  Pulse 69  Temp(Src) 98.2 F (36.8 C) (Oral)  Ht 5\' 1"  (1.549 m)  Wt 174 lb 8 oz (79.153 kg)  BMI 32.99 kg/m2  SpO2 96%  LMP 02/07/1992 Wt Readings from Last 3 Encounters:  02/18/15 174 lb 8 oz (79.153 kg)  01/22/15 173 lb 12.8 oz (78.835 kg)  12/14/14 174 lb 9.6 oz (79.198 kg)     Lab Results  Component Value Date   WBC 7.3 02/11/2015   HGB 12.9 02/11/2015   HCT 39.5 02/11/2015   PLT 235.0 02/11/2015   GLUCOSE 133* 02/11/2015   CHOL 242* 02/11/2015   TRIG 159.0* 02/11/2015   HDL 69.10 02/11/2015   LDLDIRECT 123.7 04/13/2011   LDLCALC 141* 02/11/2015   ALT 37* 02/11/2015   AST 29 02/11/2015   NA 138 02/11/2015   K 3.8 02/11/2015   CL 102 02/11/2015   CREATININE 0.61 02/11/2015   BUN 13 02/11/2015   CO2 26 02/11/2015   TSH 2.32 02/11/2015   INR 0.88 01/16/2011   HGBA1C 6.3 02/11/2015   MICROALBUR 1.6 11/13/2014    Lab Results  Component Value Date   TSH 2.32 02/11/2015   Lab Results  Component Value Date   WBC 7.3 02/11/2015   HGB 12.9 02/11/2015   HCT 39.5 02/11/2015   MCV 86.9 02/11/2015   PLT 235.0 02/11/2015   Lab  Results  Component Value Date   NA 138 02/11/2015   K 3.8 02/11/2015   CO2 26 02/11/2015   GLUCOSE 133* 02/11/2015   BUN 13 02/11/2015   CREATININE 0.61 02/11/2015   BILITOT 0.6 02/11/2015   ALKPHOS 55 02/11/2015   AST 29 02/11/2015   ALT 37* 02/11/2015   PROT 7.1 02/11/2015   ALBUMIN 4.1 02/11/2015   CALCIUM 9.7 02/11/2015    ANIONGAP 16* 11/13/2013   GFR 101.94 02/11/2015   Lab Results  Component Value Date   CHOL 242* 02/11/2015   Lab Results  Component Value Date   HDL 69.10 02/11/2015   Lab Results  Component Value Date   LDLCALC 141* 02/11/2015   Lab Results  Component Value Date   TRIG 159.0* 02/11/2015   Lab Results  Component Value Date   CHOLHDL 4 02/11/2015   Lab Results  Component Value Date   HGBA1C 6.3 02/11/2015       Assessment & Plan:   Problem List Items Addressed This Visit    Diabetes mellitus type 2 in obese ([place])    hgba1c acceptable, minimize simple carbs. Increase exercise as tolerated. Continue current meds      Relevant Orders   Hemoglobin A1c   Ear pain    Left with some tinnitus. No sign of infection, patient requests referral to ENT for evalluaiton this is placed.      Relevant Orders   Ambulatory referral to ENT   Essential hypertension    Well controlled, no changes to meds. Encouraged heart healthy diet such as the DASH diet and exercise as tolerated.       Relevant Orders   Lipid panel   CBC   Comprehensive metabolic panel   VITAMIN D 25 Hydroxy (Vit-D Deficiency, Fractures)   TSH   GERD (gastroesophageal reflux disease)    Avoid offending foods, start probiotics. Do not eat large meals in late evening and consider raising head of bed.       HYPERCHOLESTEROLEMIA    Tolerating statin, encouraged heart healthy diet, avoid trans fats, minimize simple carbs and saturated fats. Increase exercise as tolerated      Relevant Orders   Lipid panel   Vitamin D deficiency - Primary    Continue supplements.daily      Relevant Orders   VITAMIN D 25 Hydroxy (Vit-D Deficiency, Fractures)      I have changed Ms. Whitcher's glucose blood. I am also having her maintain her multivitamin, cholecalciferol, ondansetron, acetaminophen, aspirin, calcium-vitamin D, FOLBIC, potassium chloride, vitamin E, Lancets, metFORMIN, rosuvastatin, zoster vaccine live  (PF), esomeprazole, dicyclomine, and amLODipine.  Meds ordered this encounter  Medications  . glucose blood test strip    Sig: Take once daily and as needed.  DX E11.9    Dispense:  100 each    Refill:  3     Willette Alma, MD

## 2015-02-18 NOTE — Progress Notes (Signed)
Pre visit review using our clinic review tool, if applicable. No additional management support is needed unless otherwise documented below in the visit note. 

## 2015-02-18 NOTE — Assessment & Plan Note (Signed)
Well controlled, no changes to meds. Encouraged heart healthy diet such as the DASH diet and exercise as tolerated.  °

## 2015-02-18 NOTE — Patient Instructions (Signed)
Tinnitus  Tinnitus refers to hearing a sound when there is no actual source for that sound. This is often described as ringing in the ears. However, people with this condition may hear a variety of noises. A person may hear the sound in one ear or in both ears.   The sounds of tinnitus can be soft, loud, or somewhere in between. Tinnitus can last for a few seconds or can be constant for days. It may go away without treatment and come back at various times. When tinnitus is constant or happens often, it can lead to other problems, such as trouble sleeping and trouble concentrating.  Almost everyone experiences tinnitus at some point. Tinnitus that is long-lasting (chronic) or comes back often is a problem that may require medical attention.   CAUSES   The cause of tinnitus is often not known. In some cases, it can result from other problems or conditions, including:   · Exposure to loud noises from machinery, music, or other sources.  · Hearing loss.  · Ear or sinus infections.  · Earwax buildup.  · A foreign object in the ear.  · Use of certain medicines.  · Use of alcohol and caffeine.  · High blood pressure.  · Heart diseases.  · Anemia.  · Allergies.  · Meniere disease.  · Thyroid problems.  · Tumors.  · An enlarged part of a weakened blood vessel (aneurysm).  SYMPTOMS  The main symptom of tinnitus is hearing a sound when there is no source for that sound. It may sound like:   · Buzzing.  · Roaring.  · Ringing.  · Blowing air, similar to the sound heard when you listen to a seashell.  · Hissing.  · Whistling.  · Sizzling.  · Humming.  · Running water.  · A sustained musical note.  DIAGNOSIS   Tinnitus is diagnosed based on your symptoms. Your health care provider will do a physical exam. A comprehensive hearing exam (audiologic exam) will be done if your tinnitus:   · Affects only one ear (unilateral).  · Causes hearing difficulties.  · Lasts 6 months or longer.  You may also need to see a health care provider  who specializes in hearing disorders (audiologist). You may be asked to complete a questionnaire to determine the severity of your tinnitus. Tests may be done to help determine the cause and to rule out other conditions. These can include:  · Imaging studies of your head and brain, such as:    A CT scan.    An MRI.  · An imaging study of your blood vessels (angiogram).  TREATMENT   Treating an underlying medical condition can sometimes make tinnitus go away. If your tinnitus continues, other treatments may include:  · Medicines, such as certain antidepressants or sleeping aids.  · Sound generators to mask the tinnitus. These include:  ¨ Tabletop sound machines that play relaxing sounds to help you fall asleep.  ¨ Wearable devices that fit in your ear and play sounds or music.  ¨ A small device that uses headphones to deliver a signal embedded in music (acoustic neural stimulation). In time, this may change the pathways of your brain and make you less sensitive to tinnitus. This device is used for very severe cases when no other treatment is working.  · Therapy and counseling to help you manage the stress of living with tinnitus.  · Using hearing aids or cochlear implants, if your tinnitus is related to hearing   loss.  HOME CARE INSTRUCTIONS  · When possible, avoid being in loud places and being exposed to loud sounds.  · Wear hearing protection, such as earplugs, when you are exposed to loud noises.  · Do not take stimulants, such as nicotine, alcohol, or caffeine.  · Practice techniques for reducing stress, such as meditation, yoga, or deep breathing.  · Use a white noise machine, a humidifier, or other devices to mask the sound of tinnitus.  · Sleep with your head slightly raised. This may reduce the impact of tinnitus.  · Try to get plenty of rest each night.  SEEK MEDICAL CARE IF:  · You have tinnitus in just one ear.  · Your tinnitus continues for 3 weeks or longer without stopping.  · Home care measures are not  helping.  · You have tinnitus after a head injury.  · You have tinnitus along with any of the following:    Dizziness.    Loss of balance.    Nausea and vomiting.     This information is not intended to replace advice given to you by your health care provider. Make sure you discuss any questions you have with your health care provider.     Document Released: 01/23/2005 Document Revised: 02/13/2014 Document Reviewed: 06/25/2013  Elsevier Interactive Patient Education ©2016 [employer]

## 2015-02-18 NOTE — Assessment & Plan Note (Signed)
Continue supplements.daily

## 2015-02-20 ENCOUNTER — Other Ambulatory Visit: Payer: Self-pay | Admitting: Cardiology

## 2015-02-20 ENCOUNTER — Other Ambulatory Visit: Payer: Self-pay | Admitting: Internal Medicine

## 2015-02-22 NOTE — Telephone Encounter (Signed)
Refill x6

## 2015-02-22 NOTE — Telephone Encounter (Signed)
Ok to refill Sir, up to date on her appointments.

## 2015-02-22 NOTE — Telephone Encounter (Signed)
Rx request sent to pharmacy.  

## 2015-02-26 ENCOUNTER — Telehealth: Payer: Self-pay | Admitting: Obstetrics and Gynecology

## 2015-02-26 NOTE — Telephone Encounter (Signed)
Called patient to discuss benefits for a procedure. Left Voicemail requesting a call. °

## 2015-02-28 DIAGNOSIS — H9209 Otalgia, unspecified ear: Secondary | ICD-10-CM | POA: Insufficient documentation

## 2015-02-28 NOTE — Assessment & Plan Note (Signed)
hgba1c acceptable, minimize simple carbs. Increase exercise as tolerated. Continue current meds 

## 2015-02-28 NOTE — Assessment & Plan Note (Signed)
Avoid offending foods, start probiotics. Do not eat large meals in late evening and consider raising head of bed.  

## 2015-02-28 NOTE — Assessment & Plan Note (Signed)
Tolerating statin, encouraged heart healthy diet, avoid trans fats, minimize simple carbs and saturated fats. Increase exercise as tolerated 

## 2015-02-28 NOTE — Assessment & Plan Note (Signed)
Left with some tinnitus. No sign of infection, patient requests referral to ENT for evalluaiton this is placed.

## 2015-03-01 NOTE — Telephone Encounter (Signed)
I spoke with patient regarding scheduling an ultrasound. Patient wants to know if there is another test that can be done to evaluate her condition, instead of an ultrasound.

## 2015-03-02 DIAGNOSIS — M26602 Left temporomandibular joint disorder, unspecified: Secondary | ICD-10-CM | POA: Diagnosis not present

## 2015-03-02 DIAGNOSIS — H903 Sensorineural hearing loss, bilateral: Secondary | ICD-10-CM | POA: Diagnosis not present

## 2015-03-02 DIAGNOSIS — H9202 Otalgia, left ear: Secondary | ICD-10-CM | POA: Diagnosis not present

## 2015-03-02 DIAGNOSIS — M542 Cervicalgia: Secondary | ICD-10-CM | POA: Diagnosis not present

## 2015-03-02 NOTE — Telephone Encounter (Signed)
Message left to return call to Jillian Pianka at [phone].    

## 2015-03-03 NOTE — Telephone Encounter (Signed)
Patient returning call.

## 2015-03-04 NOTE — Telephone Encounter (Signed)
The ultrasound can be attempted abdominally.   We may not be able to achieve the same information from this approach as with the vaginal approach. Other imaging tests like CT scan will not be able to evaluate the lining of the uterus well.  The other important way to follow her condition is to report any vaginal bleeding to me.

## 2015-03-04 NOTE — Telephone Encounter (Signed)
Spoke with patient regarding ultrasound.  She is advised that plan for ultrasound this year was to attempt abdominal until first and attempt transvaginal ultrasound only if necessary.  Patient states that she experienced abdominal pain for one week last year after transvaginal ultrasound and does not want to plan for that again. She is willing to try abdominal ultrasound only.  Patient requests to know if there is any other way to plan procedure. Advised will review with Dr. Quincy Simmonds and return call. Patient agreeable.

## 2015-03-08 NOTE — Telephone Encounter (Signed)
Call to patient.  She is given message from Dr. Quincy Simmonds.  She is agreeable to schedule ultrasound for abdominal ultrasound.  Patient is scheduled for 03/11/15 at 1100 for ultrasound then consult with Dr. Quincy Simmonds.

## 2015-03-08 NOTE — Telephone Encounter (Signed)
Call to patient. Busy signal only.

## 2015-03-09 ENCOUNTER — Other Ambulatory Visit: Payer: Self-pay

## 2015-03-09 DIAGNOSIS — D538 Other specified nutritional anemias: Secondary | ICD-10-CM

## 2015-03-10 ENCOUNTER — Other Ambulatory Visit: Payer: Medicare Other

## 2015-03-10 ENCOUNTER — Encounter: Payer: Self-pay | Admitting: Hematology & Oncology

## 2015-03-10 ENCOUNTER — Ambulatory Visit (HOSPITAL_BASED_OUTPATIENT_CLINIC_OR_DEPARTMENT_OTHER): Payer: Medicare Other | Admitting: Hematology & Oncology

## 2015-03-10 ENCOUNTER — Telehealth: Payer: Self-pay | Admitting: *Deleted

## 2015-03-10 ENCOUNTER — Ambulatory Visit: Payer: Medicare Other | Admitting: Family

## 2015-03-10 ENCOUNTER — Other Ambulatory Visit (HOSPITAL_BASED_OUTPATIENT_CLINIC_OR_DEPARTMENT_OTHER): Payer: Medicare Other

## 2015-03-10 VITALS — BP 120/57 | HR 63 | Temp 98.5°F | Resp 14 | Ht 61.0 in | Wt 175.0 lb

## 2015-03-10 DIAGNOSIS — D509 Iron deficiency anemia, unspecified: Secondary | ICD-10-CM

## 2015-03-10 DIAGNOSIS — D538 Other specified nutritional anemias: Secondary | ICD-10-CM

## 2015-03-10 DIAGNOSIS — K909 Intestinal malabsorption, unspecified: Secondary | ICD-10-CM

## 2015-03-10 LAB — CBC WITH DIFFERENTIAL (CANCER CENTER ONLY)
BASO#: 0 10*3/uL (ref 0.0–0.2)
BASO%: 0.5 % (ref 0.0–2.0)
EOS%: 1.6 % (ref 0.0–7.0)
Eosinophils Absolute: 0.1 10*3/uL (ref 0.0–0.5)
HCT: 38.1 % (ref 34.8–46.6)
HGB: 13 g/dL (ref 11.6–15.9)
LYMPH#: 2.5 10*3/uL (ref 0.9–3.3)
LYMPH%: 32.4 % (ref 14.0–48.0)
MCH: 29.3 pg (ref 26.0–34.0)
MCHC: 34.1 g/dL (ref 32.0–36.0)
MCV: 86 fL (ref 81–101)
MONO#: 0.7 10*3/uL (ref 0.1–0.9)
MONO%: 8.7 % (ref 0.0–13.0)
NEUT#: 4.3 10*3/uL (ref 1.5–6.5)
NEUT%: 56.8 % (ref 39.6–80.0)
PLATELETS: 197 10*3/uL (ref 145–400)
RBC: 4.44 10*6/uL (ref 3.70–5.32)
RDW: 12.4 % (ref 11.1–15.7)
WBC: 7.6 10*3/uL (ref 3.9–10.0)

## 2015-03-10 LAB — IRON AND TIBC
%SAT: 33 % (ref 21–57)
IRON: 94 ug/dL (ref 41–142)
TIBC: 284 ug/dL (ref 236–444)
UIBC: 190 ug/dL (ref 120–384)

## 2015-03-10 LAB — FERRITIN: FERRITIN: 342 ng/mL — AB (ref 9–269)

## 2015-03-10 NOTE — Progress Notes (Signed)
Hematology and Oncology Follow Up Visit  DEEDEE COVIN [id number] 1940-03-27 75 y.o. 03/10/2015   Principle Diagnosis:   Iron deficiency anemia secondary to malabsorption  Current Therapy:    IV iron-patient received 1 dose back in January 2017     Interim History:  Ms. Latterell is back for follow-up. She is somewhat worried over the fact that she needs iron every few months. I told her that because of her Nexium and because her diabetes, she just does not absorb iron well. As such, she will need iron on occasion.  We last gave her iron in early January. Her ferritin was 140 but her iron saturation is only 17%. She has some symptoms of iron deficiency. She has some mouth soreness. She had decreased taste for food.  She's had no problems sleeping. She's had no arthralgias or myalgias.    Overall, her performance status is ECOG 1.  Medications:  Current outpatient prescriptions:  .  acetaminophen (TYLENOL) 500 MG tablet, Take 2 tablets (1,000 mg total) by mouth every 6 (six) hours as needed for moderate pain., Disp: 30 tablet, Rfl: 0 .  amLODipine (NORVASC) 5 MG tablet, Take 1 tablet by mouth  daily, Disp: 90 tablet, Rfl: 1 .  aspirin 81 MG tablet, Take 81 mg by mouth daily., Disp: , Rfl:  .  calcium-vitamin D (OSCAL WITH D) 500-200 MG-UNIT per tablet, Take 1 tablet by mouth., Disp: , Rfl:  .  cholecalciferol (VITAMIN D) 1000 UNITS tablet, Take 1,000 Units by mouth daily.  , Disp: , Rfl:  .  dicyclomine (BENTYL) 10 MG capsule, Take 1 capsule (10 mg total) by mouth 4 (four) times daily as needed., Disp: 120 capsule, Rfl: 6 .  DIOVAN 320 MG tablet, Take 1 tablet by mouth  daily, Disp: 90 tablet, Rfl: 1 .  esomeprazole (NEXIUM) 40 MG capsule, Take 1 capsule (40 mg total) by mouth daily before breakfast., Disp: 30 capsule, Rfl: 11 .  FOLBIC 2.5-25-2 MG TABS, TAKE ONE TABLET BY MOUTH EVERY DAY, Disp: 90 tablet, Rfl: 3 .  glucose blood test strip, Take once daily and as needed.  DX  E11.9, Disp: 100 each, Rfl: 3 .  Lancets MISC, Use as instructed once daily to check blood sugar. DX E11.9 (Patient taking differently: Use as instructed twice daily to check blood sugar. DX E11.9), Disp: 200 each, Rfl: 2 .  metFORMIN (GLUCOPHAGE-XR) 500 MG 24 hr tablet, Take 1 tablet (500 mg total) by mouth 3 (three) times daily with meals., Disp: 270 tablet, Rfl: 1 .  metoCLOPramide (REGLAN) 5 MG tablet, Take 1 tablet by mouth two  times daily, Disp: 180 tablet, Rfl: 1 .  metoprolol (LOPRESSOR) 50 MG tablet, Take 1 tablet by mouth two  times daily, Disp: 180 tablet, Rfl: 1 .  Multiple Vitamin (MULTIVITAMIN) tablet, Take 1 tablet by mouth daily.  , Disp: , Rfl:  .  ondansetron (ZOFRAN) 4 MG tablet, Take 4 mg by mouth every 4 (four) hours as needed. For nausea  , Disp: , Rfl:  .  potassium chloride (K-DUR,KLOR-CON) 10 MEQ tablet, Take 1 tablet by mouth  daily, Disp: 60 tablet, Rfl: 2 .  rosuvastatin (CRESTOR) 10 MG tablet, Take 1 tablet (10 mg total) by mouth daily., Disp: 90 tablet, Rfl: 1 .  vitamin E 400 UNIT capsule, Take 400 Units by mouth daily., Disp: , Rfl:  .  zoster vaccine live, PF, (ZOSTAVAX) 60454 UNT/0.65ML injection, Inject 19,400 Units into the skin once., Disp: 1 each, Rfl:  0  Allergies:  Allergies  Allergen Reactions  . Tramadol     Dizziness     Past Medical History, Surgical history, Social history, and Family History were reviewed and updated.  Review of Systems: As above  Physical Exam:  height is 5\' 1"  (1.549 m) and weight is 175 lb (79.379 kg). Her oral temperature is 98.5 F (36.9 C). Her blood pressure is 120/57 and her pulse is 63. Her respiration is 14.   Wt Readings from Last 3 Encounters:  03/10/15 175 lb (79.379 kg)  02/18/15 174 lb 8 oz (79.153 kg)  01/22/15 173 lb 12.8 oz (78.835 kg)     Well-developed well-nourished white female in no obvious distress. Head and neck exam shows no ocular or oral lesions. There are no palpable cervical or  supraclavicular lymph nodes. Lungs are clear. Cardiac exam regular rate and rhythm with no murmurs, rubs or bruits. Abdomen is soft. She has good bowel sounds. There is no fluid wave. There is no palpable liver or spleen tip. Back exam shows no tenderness over the spine, ribs or hips. Extremities shows no clubbing, cyanosis or edema. Skin exam shows no rashes, ecchymosis or petechia. Neurological exam shows no focal neurological deficits.  Lab Results  Component Value Date   WBC 7.6 03/10/2015   HGB 13.0 03/10/2015   HCT 38.1 03/10/2015   MCV 86 03/10/2015   PLT 197 03/10/2015     Chemistry      Component Value Date/Time   NA 138 02/11/2015 0911   K 3.8 02/11/2015 0911   CL 102 02/11/2015 0911   CO2 26 02/11/2015 0911   BUN 13 02/11/2015 0911   CREATININE 0.61 02/11/2015 0911   CREATININE 0.71 05/04/2014 0829      Component Value Date/Time   CALCIUM 9.7 02/11/2015 0911   ALKPHOS 55 02/11/2015 0911   AST 29 02/11/2015 0911   ALT 37* 02/11/2015 0911   BILITOT 0.6 02/11/2015 0911         Impression and Plan: Ms. Rosero is 75 year old white female. She has iron deficiency. I suspect that this is malabsorption. She's had stools tested which were negative for blood.  I think at this point, we can have her come back in 4 months. Other she wants to make sure that we see her and check her iron stores before they get low. I'm sure that because of the diabetes and also because of the Nexium that she takes, her iron this is not can be absorbed through her intestines.  She has a very strong faith. I am very much impressed by this.    Volanda Napoleon, MD 2/1/201711:36 AM.

## 2015-03-10 NOTE — Telephone Encounter (Addendum)
Detailed message left on home voice mail  ----- Message from Volanda Napoleon, MD sent at 03/10/2015  1:19 PM EST ----- Call - Iron levels are excellent!!!  [name]

## 2015-03-11 ENCOUNTER — Ambulatory Visit (INDEPENDENT_AMBULATORY_CARE_PROVIDER_SITE_OTHER): Payer: Medicare Other | Admitting: Obstetrics and Gynecology

## 2015-03-11 ENCOUNTER — Encounter: Payer: Self-pay | Admitting: Obstetrics and Gynecology

## 2015-03-11 ENCOUNTER — Ambulatory Visit (INDEPENDENT_AMBULATORY_CARE_PROVIDER_SITE_OTHER): Payer: Medicare Other

## 2015-03-11 VITALS — BP 138/62 | HR 66 | Resp 14 | Ht 61.0 in | Wt 176.0 lb

## 2015-03-11 DIAGNOSIS — R938 Abnormal findings on diagnostic imaging of other specified body structures: Secondary | ICD-10-CM | POA: Diagnosis not present

## 2015-03-11 DIAGNOSIS — R9389 Abnormal findings on diagnostic imaging of other specified body structures: Secondary | ICD-10-CM

## 2015-03-11 DIAGNOSIS — D259 Leiomyoma of uterus, unspecified: Secondary | ICD-10-CM

## 2015-03-11 LAB — RETICULOCYTES: RETICULOCYTE COUNT: 1.4 % (ref 0.6–2.6)

## 2015-03-11 NOTE — Progress Notes (Signed)
Subjective  75 y.o. G0P0 female here for pelvic ultrasound for  Evaluation of the uterine lining showing mild thickening at the fundal region with possible cystic change.  No prior endometrial sampling.  Had uterine perforation with surgical attempted to do dilation and curettage on 02/25/13. Patient had laparoscopy with coagulation of perforation site. Entry into the endometrial cavity for sampling was not possible.  Had follow up with GYN ONC following this and observational management with a follow up ultrasound 6 months later was recommended along with vigilance to report any postmenopausal bleeding. Patient states no bleeding or spotting ever.   Pelvic ultrasound 02/12/14: Uterus with 13 x 12 mm fibroid.  EMS 6.34 mm. Cannot rule out polyp. (No change from previous showing EMS 7.68 on 08/20/13 ultrasound.) Ovaries not seen.  Patient tender with transvaginal scan and exam ws technically difficult due to this.  Increased bowel pattern noted.   Has had ultrasounds dating to 09/22/09 ordered by PCP for pain which have documented this small focus of the endometrium.  No real change over time.   Objective  Pelvic ultrasound images and report reviewed with patient.  Uterus - 13 x 10 mm fibroid. EMS -  4.87 mm. Ovaries - normal. Free fluid - no     Assessment  Endometrial thickening. Stable.  Asymptomatic.  Probably has a small polyp.  Stable uterine fibroid.  Plan  Discussion of polyps and fibroids.  Discussion of the importance of evaluating any postmenopausal bleeding or spotting.  She indicates understanding.  At this point, yearly ultrasound is not really necessary unless the patient develops pain or bleeding.  I do recommend yearly well women visits either at this office or with the patient's PCP.   _25______ minutes face to face time of which over 50% was spent in counseling.   After visit summary to patient.

## 2015-03-12 IMAGING — CR DG CHEST 2V
2 series · 2 of 2 positions shown · non-contrast
Comparison: Chest radiograph performed 07/13/2012

CLINICAL DATA: Acute onset of diffuse chest pain and shortness
breath. Diffuse left-sided numbness. Current history of hypertension
and diabetes.

EXAM:
CHEST  2 VIEW

[w chest pa]
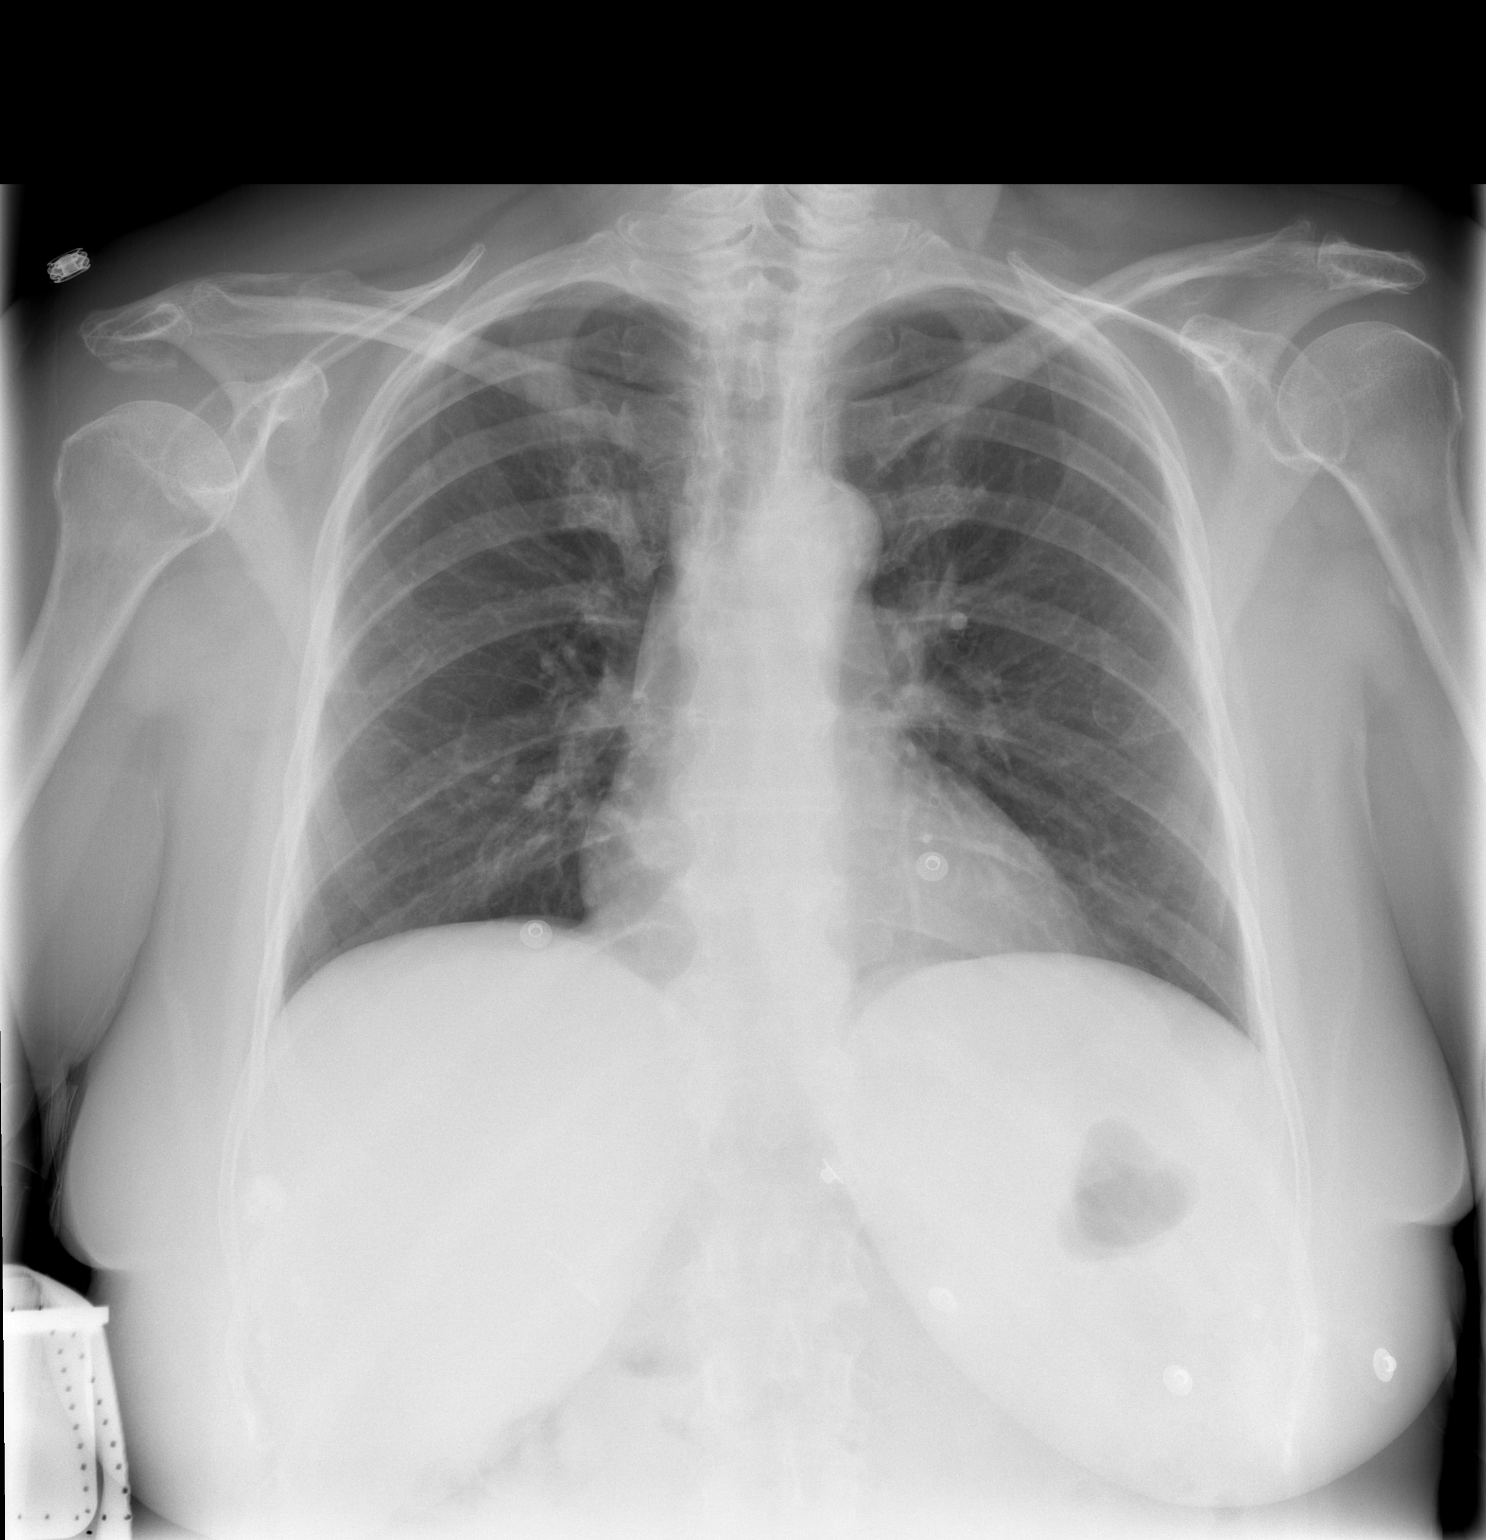

[w chest lat]
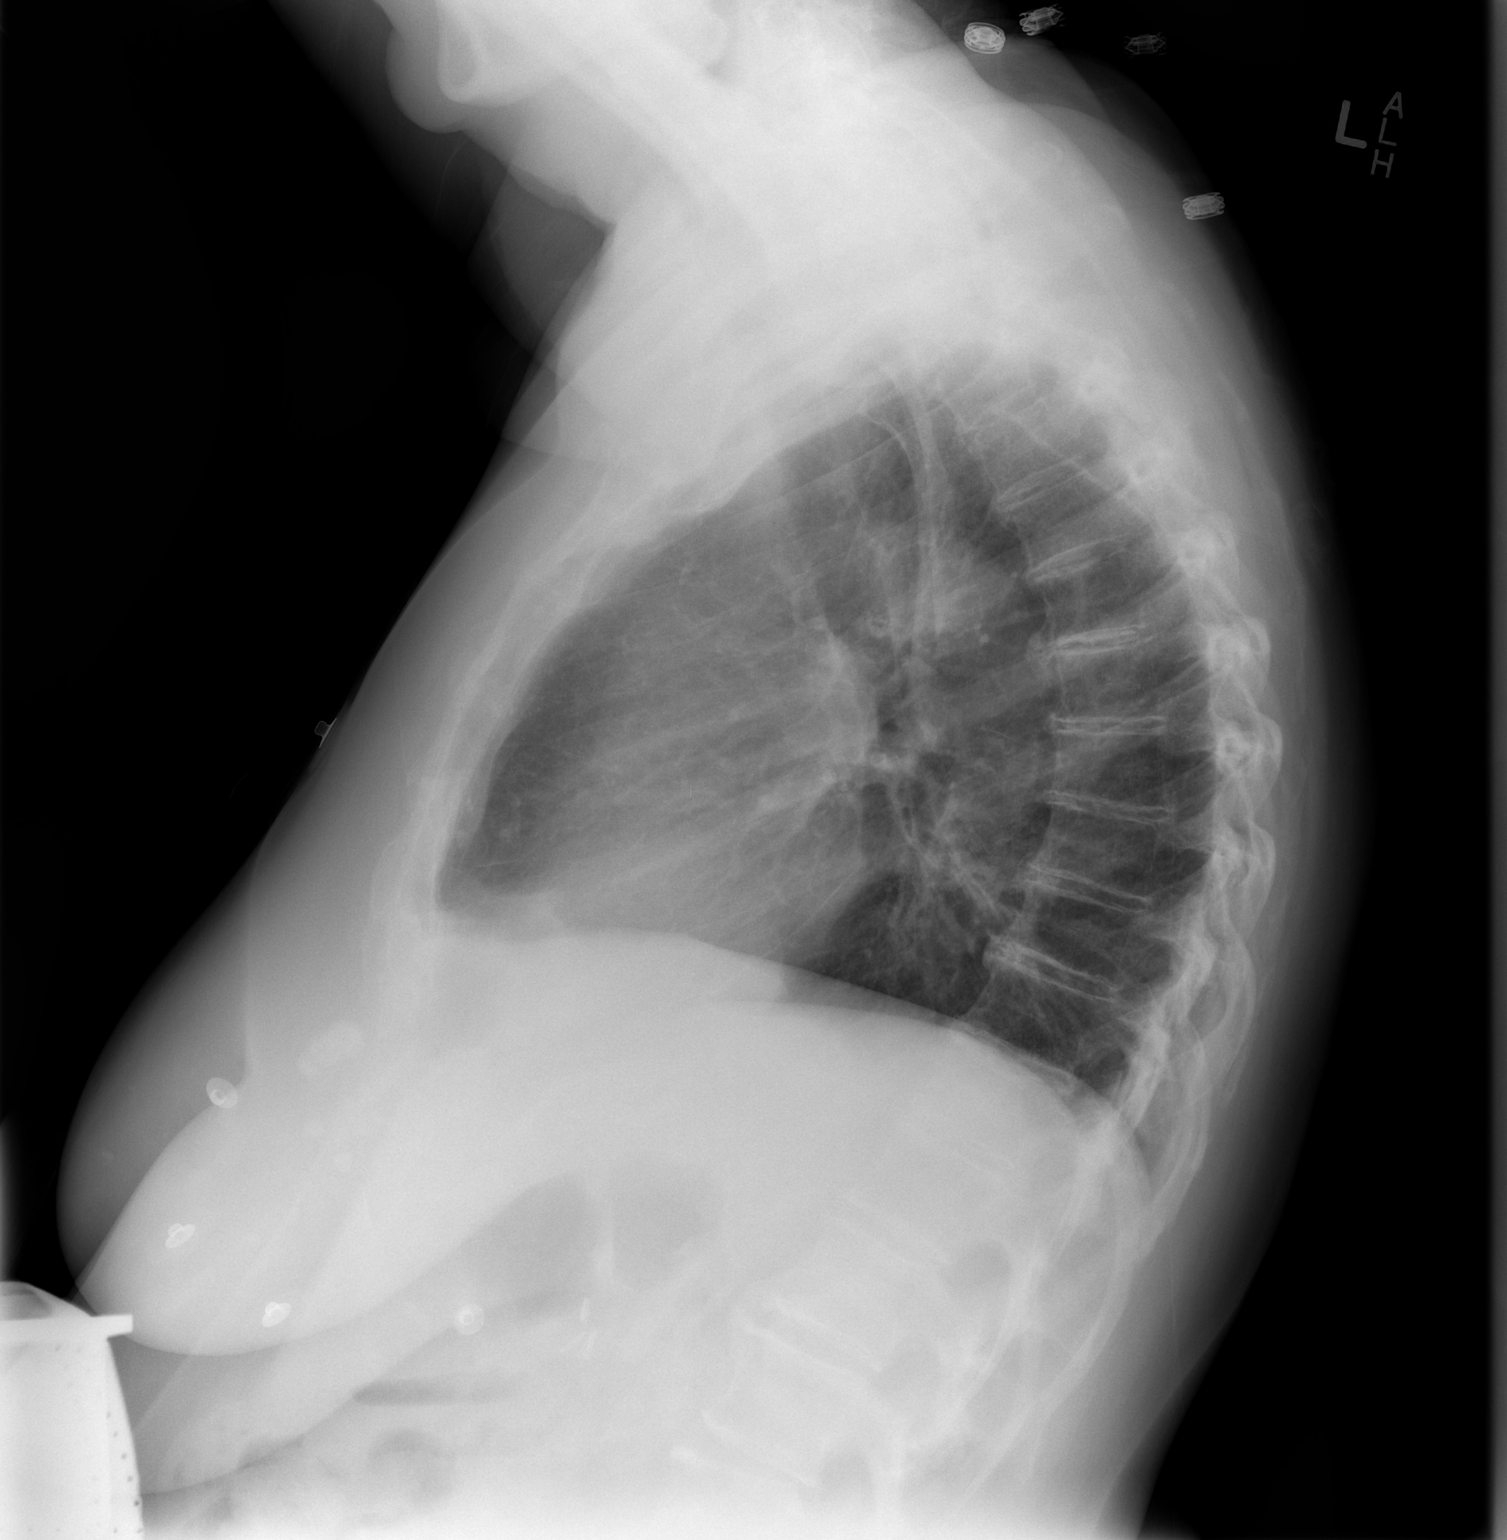

[2 of 2 positions shown; findings below may reference images not displayed]

FINDINGS: The lungs are well-aerated and clear. There is no evidence of focal
opacification, pleural effusion or pneumothorax.

The heart is normal in size; the mediastinal contour is within
normal limits. No acute osseous abnormalities are seen. Clips are
noted within the right upper quadrant, reflecting prior
cholecystectomy.
IMPRESSION: No acute cardiopulmonary process seen.

## 2015-03-12 IMAGING — CT CT HEAD W/O CM
1 series · 15 of 30 positions shown, 19 images · non-contrast
Comparison: CT of the head, and MRI of the brain performed
01/16/2011

CLINICAL DATA: Acute onset of paresthesias. Supraventricular
tachycardia earlier tonight. Acute onset of headache. Initial
encounter.

EXAM:
CT HEAD WITHOUT CONTRAST
TECHNIQUE: Contiguous axial images were obtained from the base of the skull
through the vertex without intravenous contrast.

[Series 2: head 5.0 h30s · axial · 0.40mm/px · z∈[-147,+3]mm · 15 of 34 slices shown, 19 images]
[im 2/34  brain]
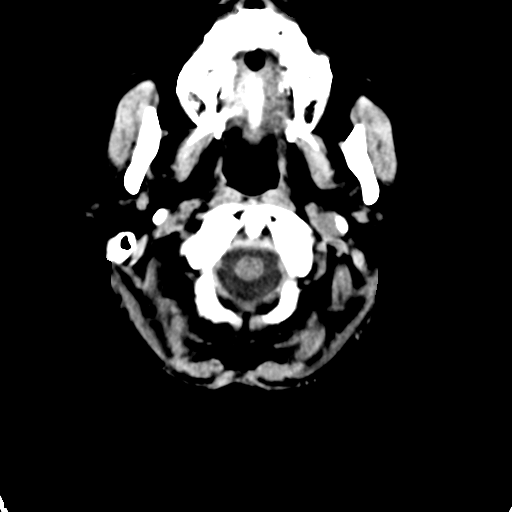
[im 2/34  bone]
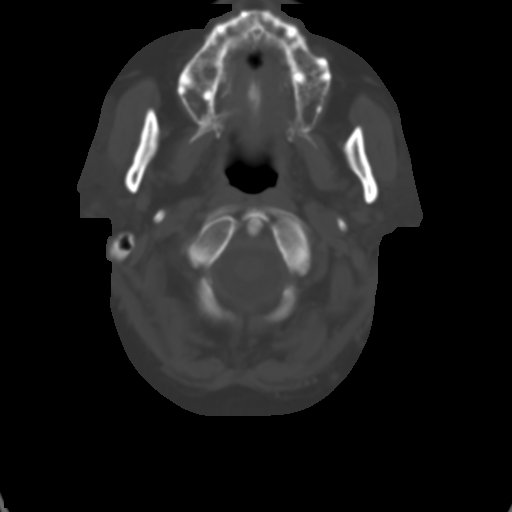
[im 4/34  brain]
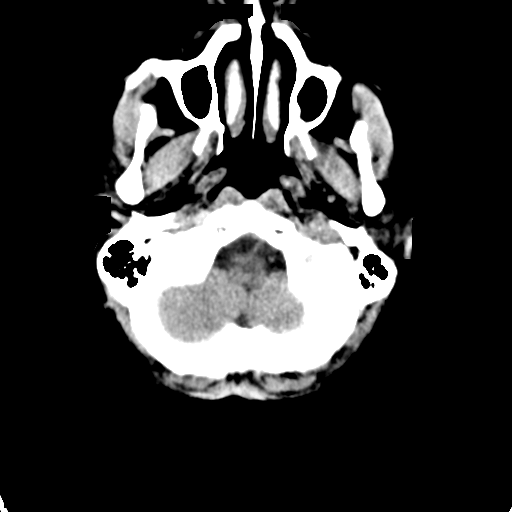
[im 6/34  brain]
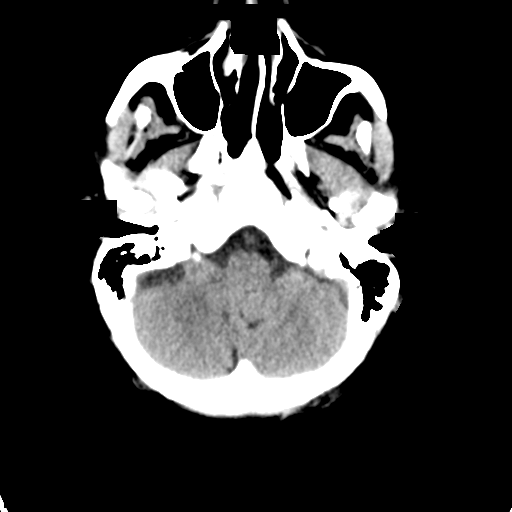
[im 8/34  brain]
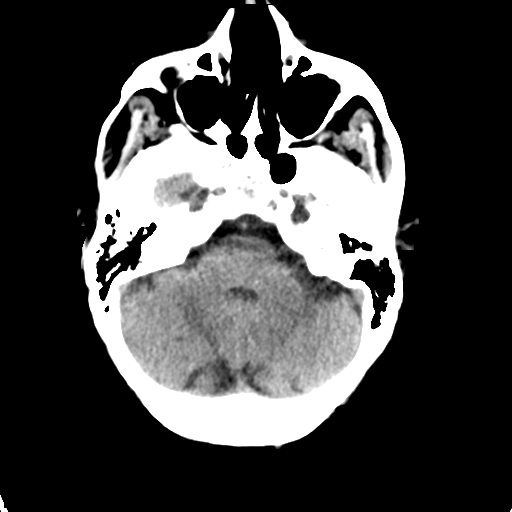
[im 11/34  brain]
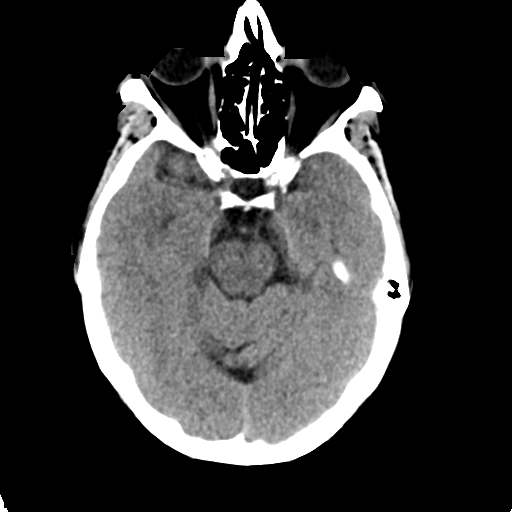
[im 11/34  bone]
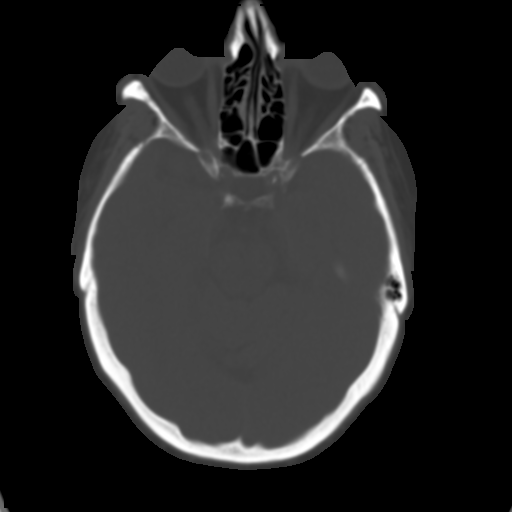
[im 13/34  brain]
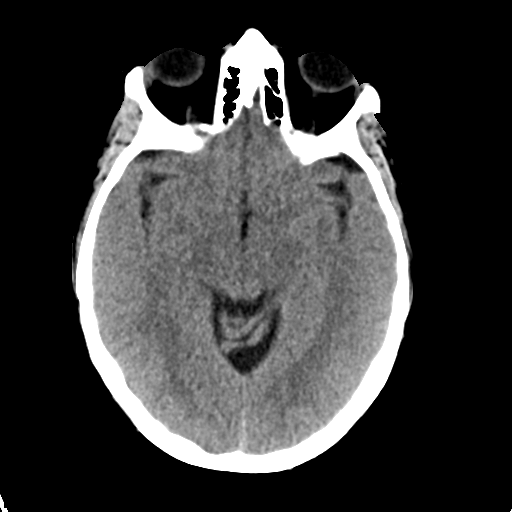
[im 15/34  brain]
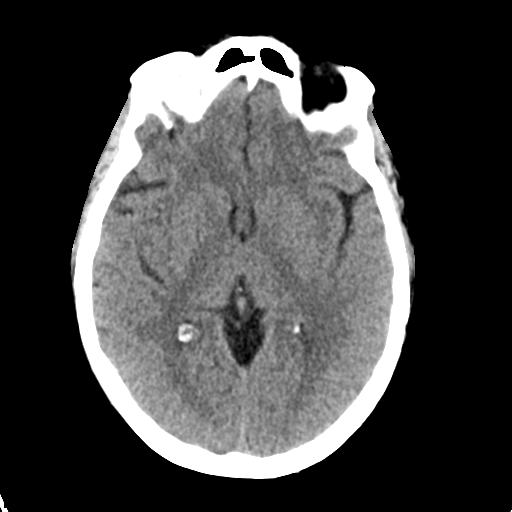
[im 18/34  brain]
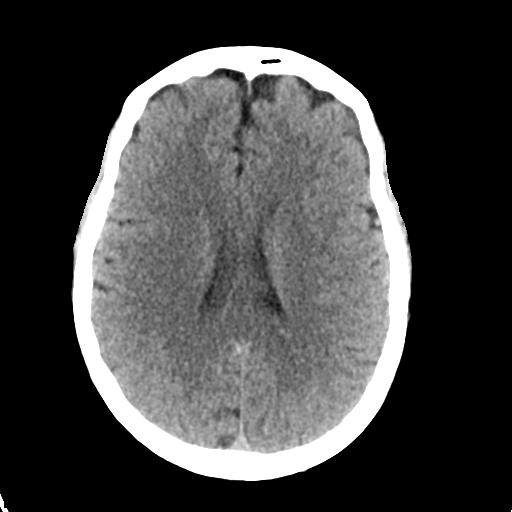
[im 19/34  brain]
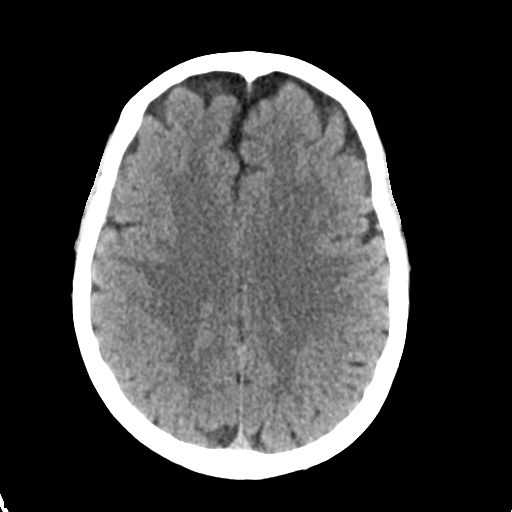
[im 19/34  bone]
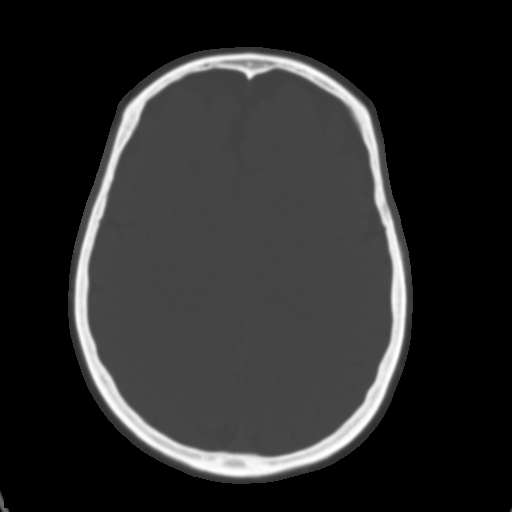
[im 21/34  brain]
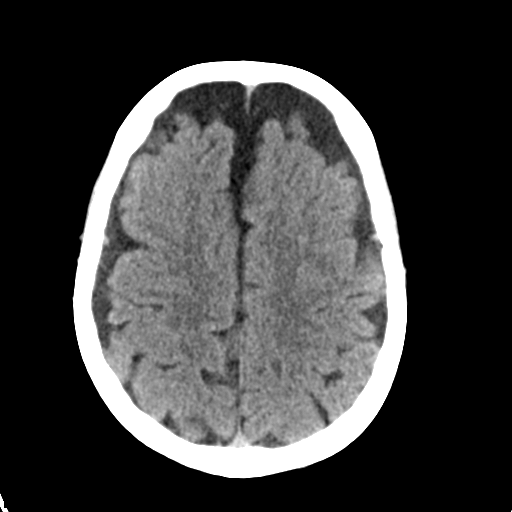
[im 23/34  brain]
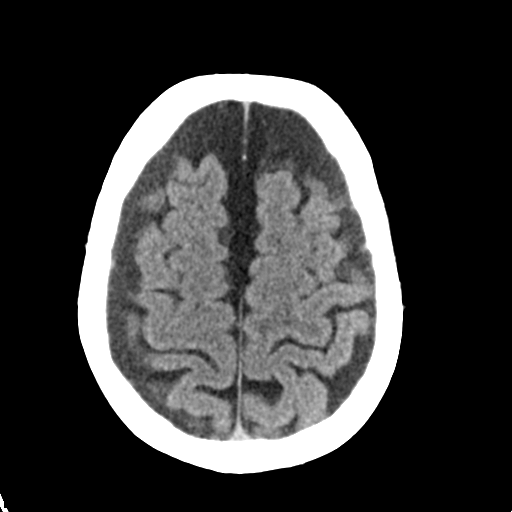
[im 26/34  brain]
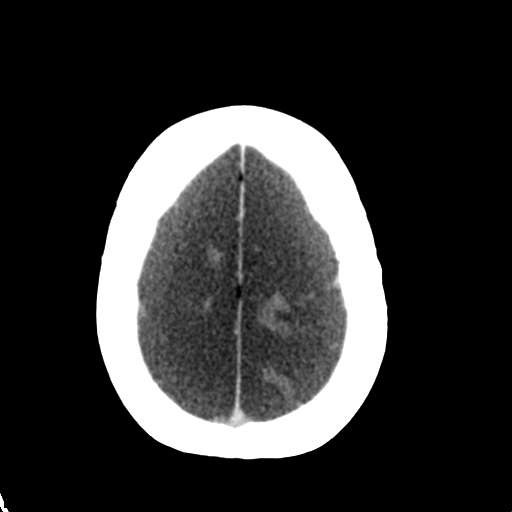
[im 28/34  brain]
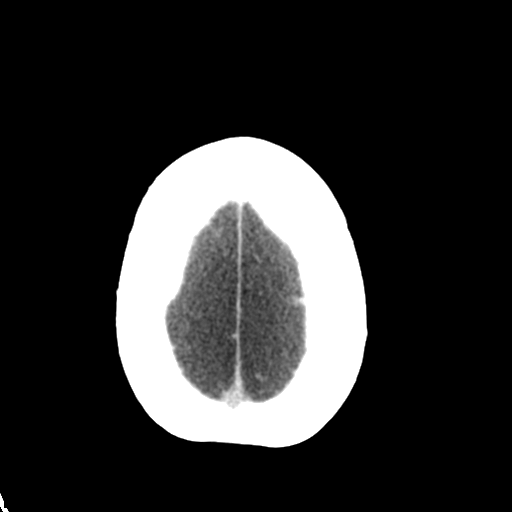
[im 28/34  bone]
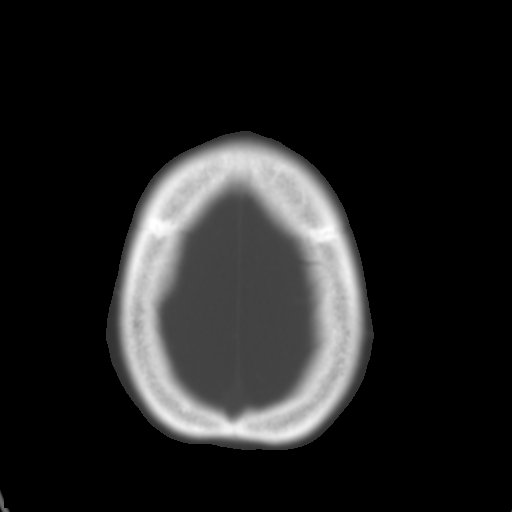
[im 30/34  brain]
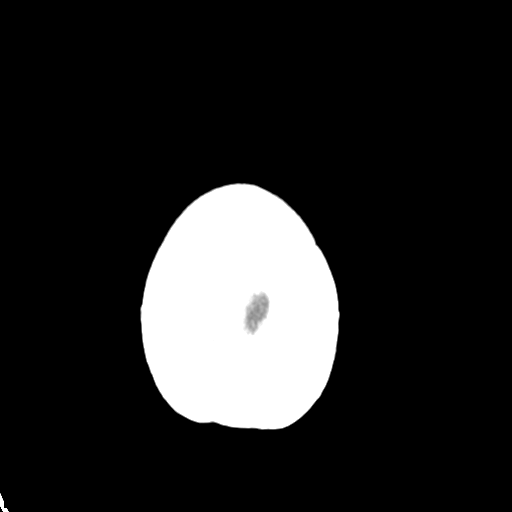
[im 32/34  brain]
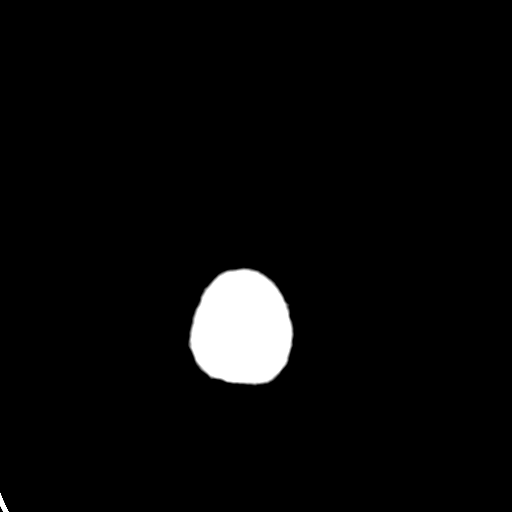

[15 of 30 positions shown; findings below may reference images not displayed]

FINDINGS: There is no evidence of acute infarction, mass lesion, or intra- or
extra-axial hemorrhage on CT.

Prominence of the sulci reflects mild cortical volume loss. A large
empty sella is incidentally noted.

The posterior fossa, including the cerebellum, brainstem and fourth
ventricle, is within normal limits. The third and lateral
ventricles, and basal ganglia are unremarkable in appearance. The
cerebral hemispheres are symmetric in appearance, with normal
gray-white differentiation. No mass effect or midline shift is seen.

There is no evidence of fracture; visualized osseous structures are
unremarkable in appearance. The orbits are within normal limits. The
paranasal sinuses and mastoid air cells are well-aerated. No
significant soft tissue abnormalities are seen.
IMPRESSION: 1. No acute intracranial pathology seen on CT.
2. Mild cortical volume loss noted.

## 2015-03-16 ENCOUNTER — Telehealth: Payer: Self-pay | Admitting: Family Medicine

## 2015-03-16 NOTE — Telephone Encounter (Signed)
Patient [facility] medicare wellness 03/30/2015 at 1pm

## 2015-03-16 NOTE — Telephone Encounter (Signed)
Patient scheduled medicare wellness Monday 03/22/15 at 1pm

## 2015-03-22 ENCOUNTER — Ambulatory Visit: Payer: Medicare Other

## 2015-03-30 ENCOUNTER — Ambulatory Visit: Payer: Medicare Other

## 2015-03-30 ENCOUNTER — Telehealth: Payer: Self-pay | Admitting: *Deleted

## 2015-03-30 NOTE — Telephone Encounter (Signed)
Received diabetic testing form from [facility]; forwarded to provider/SLS 02/21

## 2015-04-05 ENCOUNTER — Ambulatory Visit (INDEPENDENT_AMBULATORY_CARE_PROVIDER_SITE_OTHER): Payer: Medicare Other | Admitting: Physician Assistant

## 2015-04-05 ENCOUNTER — Encounter: Payer: Self-pay | Admitting: Physician Assistant

## 2015-04-05 VITALS — BP 133/60 | HR 66 | Temp 98.5°F | Ht 61.0 in | Wt 174.2 lb

## 2015-04-05 DIAGNOSIS — R109 Unspecified abdominal pain: Secondary | ICD-10-CM

## 2015-04-05 NOTE — Progress Notes (Signed)
Pre visit review using our clinic review tool, if applicable. No additional management support is needed unless otherwise documented below in the visit note. 

## 2015-04-05 NOTE — Assessment & Plan Note (Signed)
Suspect strain of oblique muscle groups. Reproduced with ROM and palpation. No trauma or bony tenderness noted so do not feel imaging is warranted. Patient allergic to Tramadol and cannot tolerate oral NSAID. Recommend ES Tylenol, topical Aspercreme. Rest and heat to the area. Follow-up if not resolving.

## 2015-04-05 NOTE — Patient Instructions (Signed)
Please take Tylenol as directed -- no more than 2 tablets every 6-8 hours. Apply topical Aspercreme to the area of concern. Apply heating pad for 10 minutes -- 2-3 times per day. Avoid heavy lifting. Follow-up if symptoms are not improving within 48 hours.  Please keep your phone on. I will set you back up with your Dermatologist, Dr. Melina Copa.

## 2015-04-05 NOTE — Progress Notes (Signed)
Patient presents to clinic today c/o 4-5 days of R side pain, radiating into R upper back. Pain is worse with with laying on affected side of ROM of R shoulder. Denies trauma or injury.  Endorses a pulling pain. Denies nausea or vomiting. Denies fever, chills.  Past Medical History  Diagnosis Date  . Gastroparesis   . Pure hypercholesterolemia   . Headache(784.0)   . Unspecified essential hypertension   . Esophageal reflux   . Type II or unspecified type diabetes mellitus without mention of complication, not stated as uncontrolled   . Arthritis     Spinal Osteoarthritis  . Diverticulosis 08/30/2000    Colonoscopy   . Gastric polyp     Fundic Gland  . Anxiety   . Cataract   . Heart murmur     Echocardiogram 2/11: EF 60-65%, mild LAE, grade 1 diastolic dysfunction, aortic valve sclerosis, mean gradient 9 mm of mercury, PASP 34  . Chest pain     Myoview 12/15 no ischemia.  . Carcinoid tumor of stomach   . PSVT (paroxysmal supraventricular tachycardia) ([place])   . Leg swelling     bilateral  . Chronic kidney disease     Left kidney smaller than right kidney  . Cancer ([place])   . PONV (postoperative nausea and vomiting)     pt states only needs small amount of anesthesia  . Stroke ([place])     tia, 2014  . Iron deficiency anemia, unspecified   . Encounter for preventative adult health care exam with abnormal findings 09/14/2013  . Unspecified hereditary and idiopathic peripheral neuropathy 10/29/2013  . Diabetic peripheral neuropathy ([place]) 10/29/2013  . Diabetes mellitus type 2 in obese ([place]) 09/05/2006    Qualifier: Diagnosis of  By: Marca Ancona RMA, Lucy    . Anemia 06/08/2014  . 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 acetaminophen (TYLENOL) 500 MG tablet Take 2 tablets (1,000 mg total) by mouth every 6 (six) hours as needed for moderate pain. 30 tablet 0  . amLODipine (NORVASC) 5 MG  tablet Take 1 tablet by mouth  daily 90 tablet 1  . aspirin 81 MG tablet Take 81 mg by mouth daily.    . calcium-vitamin D (OSCAL WITH D) 500-200 MG-UNIT per tablet Take 1 tablet by mouth.    . cholecalciferol (VITAMIN D) 1000 UNITS tablet Take 1,000 Units by mouth daily.      Marland Kitchen dicyclomine (BENTYL) 10 MG capsule Take 1 capsule (10 mg total) by mouth 4 (four) times daily as needed. 120 capsule 6  . DIOVAN 320 MG tablet Take 1 tablet by mouth  daily 90 tablet 1  . esomeprazole (NEXIUM) 40 MG capsule Take 1 capsule (40 mg total) by mouth daily before breakfast. 30 capsule 11  . FOLBIC 2.5-25-2 MG TABS TAKE ONE TABLET BY MOUTH EVERY DAY 90 tablet 3  . glucose blood test strip Take once daily and as needed.  DX E11.9 100 each 3  . Lancets MISC Use as instructed once daily to check blood sugar. DX E11.9 (Patient taking differently: Use as instructed twice daily to check blood sugar. DX E11.9) 200 each 2  . metFORMIN (GLUCOPHAGE-XR) 500 MG 24 hr tablet Take 1 tablet (500 mg total) by mouth 3 (three) times daily with meals. 270 tablet 1  . metoCLOPramide (REGLAN) 5 MG tablet Take 1 tablet by mouth two  times daily  180 tablet 1  . metoprolol (LOPRESSOR) 50 MG tablet Take 1 tablet by mouth two  times daily 180 tablet 1  . Multiple Vitamin (MULTIVITAMIN) tablet Take 1 tablet by mouth daily.      . ondansetron (ZOFRAN) 4 MG tablet Take 4 mg by mouth every 4 (four) hours as needed. For nausea      . potassium chloride (K-DUR,KLOR-CON) 10 MEQ tablet Take 1 tablet by mouth  daily 60 tablet 2  . rosuvastatin (CRESTOR) 10 MG tablet Take 1 tablet (10 mg total) by mouth daily. 90 tablet 1  . vitamin E 400 UNIT capsule Take 400 Units by mouth daily.    Marland Kitchen zoster vaccine live, PF, (ZOSTAVAX) 16109 UNT/0.65ML injection Inject 19,400 Units into the skin once. 1 each 0   No current facility-administered medications on file prior to visit.    Allergies  Allergen Reactions  . Tramadol     Dizziness     Family  History  Problem Relation Age of Onset  . Diabetes Mother   . Stroke Father     deceased age 38  . Heart disease Sister     deceased MI age 45  . Heart disease Brother     deceased MI age 49  . Colon cancer Neg Hx   . Esophageal cancer Neg Hx   . Stomach cancer Neg Hx   . Rectal cancer Neg Hx   . Diabetes Maternal Grandmother   . Hypertension Paternal Grandmother   . Diabetes Sister   . Heart disease Sister   . Hypertension Sister   . Hyperlipidemia Sister   . Diabetes Sister   . Heart disease Sister   . Hypertension Sister   . Hyperlipidemia Sister   . Diabetes Brother   . Heart disease Brother   . Hypertension Brother   . Hyperlipidemia Brother     Social History   Social History  . Marital Status: Married    Spouse Name: N/A  . Number of Children: 0  . Years of Education: college   Occupational History  . Retail    Social History Main Topics  . Smoking status: Never Smoker   . Smokeless tobacco: Never Used     Comment: Never used tobacco  . Alcohol Use: No  . Drug Use: No  . Sexual Activity: No     Comment: lives alone, no dietary restrictions except avoid fresh veg, fruit, whole grains   Other Topics Concern  . 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   Patient is right-handed.   Patient has a BA degree.   One caffeine drink daily    Review of Systems - See HPI.  All other ROS are negative.  BP 133/60 mmHg  Pulse 66  Temp(Src) 98.5 F (36.9 C) (Oral)  Ht 5\' 1"  (1.549 m)  Wt 174 lb 3.2 oz (79.017 kg)  BMI 32.93 kg/m2  SpO2 100%  LMP 02/07/1992  Physical Exam  Constitutional: She is oriented to person, place, and time and well-developed, well-nourished, and in no distress.  HENT:  Head: Normocephalic and atraumatic.  Eyes: Conjunctivae are normal.  Cardiovascular: Normal rate, regular rhythm, normal heart sounds and intact distal pulses.   Pulmonary/Chest: Effort normal  and breath sounds normal. No respiratory distress. She has no wheezes. She has no rales. She exhibits no tenderness.    Neurological: She is alert and oriented to person, place, and  time.  Vitals reviewed.   Recent Results (from the past 2160 hour(s))  CBC with Differential [facility])     Status: None   Collection Time: 01/22/15  1:38 PM  Result Value Ref Range   WBC 9.4 3.9 - 10.0 10e3/uL   RBC 4.54 3.70 - 5.32 10e6/uL   HGB 13.3 11.6 - 15.9 g/dL   HCT 39.0 34.8 - 46.6 %   MCV 86 81 - 101 fL   MCH 29.3 26.0 - 34.0 pg   MCHC 34.1 32.0 - 36.0 g/dL   RDW 12.2 11.1 - 15.7 %   Platelets 232 145 - 400 10e3/uL   NEUT# 5.8 1.5 - 6.5 10e3/uL   LYMPH# 2.8 0.9 - 3.3 10e3/uL   MONO# 0.6 0.1 - 0.9 10e3/uL   Eosinophils Absolute 0.1 0.0 - 0.5 10e3/uL   BASO# 0.0 0.0 - 0.2 10e3/uL   NEUT% 61.9 39.6 - 80.0 %   LYMPH% 30.1 14.0 - 48.0 %   MONO% 6.7 0.0 - 13.0 %   EOS% 0.9 0.0 - 7.0 %   BASO% 0.4 0.0 - 2.0 %  Reticulocytes     Status: None   Collection Time: 01/22/15  1:39 PM  Result Value Ref Range   Retic Ct Pct 0.9 0.4 - 2.3 %   RBC. 4.42 3.87 - 5.11 MIL/uL   ABS Retic 39.8 19.0 - 186.0 K/uL  Ferritin     Status: None   Collection Time: 01/22/15  1:39 PM  Result Value Ref Range   Ferritin 142 9 - 269 ng/ml  Iron and TIBC     Status: Abnormal   Collection Time: 01/22/15  1:39 PM  Result Value Ref Range   Iron 56 41 - 142 ug/dL   TIBC 330 236 - 444 ug/dL   UIBC 274 120 - 384 ug/dL   %SAT 17 (L) 21 - 57 %  CBC     Status: None   Collection Time: 02/11/15  9:11 AM  Result Value Ref Range   WBC 7.3 4.0 - 10.5 K/uL   RBC 4.54 3.87 - 5.11 Mil/uL   Platelets 235.0 150.0 - 400.0 K/uL   Hemoglobin 12.9 12.0 - 15.0 g/dL   HCT 39.5 36.0 - 46.0 %   MCV 86.9 78.0 - 100.0 fl   MCHC 32.8 30.0 - 36.0 g/dL   RDW 12.5 11.5 - 15.5 %  Comprehensive metabolic panel     Status: Abnormal   Collection Time: 02/11/15  9:11 AM  Result Value Ref Range   Sodium 138 135 - 145 mEq/L    Potassium 3.8 3.5 - 5.1 mEq/L   Chloride 102 96 - 112 mEq/L   CO2 26 19 - 32 mEq/L   Glucose, Bld 133 (H) 70 - 99 mg/dL   BUN 13 6 - 23 mg/dL   Creatinine, Ser 0.61 0.40 - 1.20 mg/dL   Total Bilirubin 0.6 0.2 - 1.2 mg/dL   Alkaline Phosphatase 55 39 - 117 U/L   AST 29 0 - 37 U/L   ALT 37 (H) 0 - 35 U/L   Total Protein 7.1 6.0 - 8.3 g/dL   Albumin 4.1 3.5 - 5.2 g/dL   Calcium 9.7 8.4 - 10.5 mg/dL   GFR 101.94 >60.00 mL/min  Lipid panel     Status: Abnormal   Collection Time: 02/11/15  9:11 AM  Result Value Ref Range   Cholesterol 242 (H) 0 - 200 mg/dL    Comment: ATP III Classification  Desirable:  < 200 mg/dL               Borderline High:  200 - 239 mg/dL          High:  > = 240 mg/dL   Triglycerides 159.0 (H) 0.0 - 149.0 mg/dL    Comment: Normal:  <150 mg/dLBorderline High:  150 - 199 mg/dL   HDL 69.10 >39.00 mg/dL   VLDL 31.8 0.0 - 40.0 mg/dL   LDL Cholesterol 141 (H) 0 - 99 mg/dL   Total CHOL/HDL Ratio 4     Comment:                Men          Women1/2 Average Risk     3.4          3.3Average Risk          5.0          4.42X Average Risk          9.6          7.13X Average Risk          15.0          11.0                       NonHDL 172.79     Comment: NOTE:  Non-HDL goal should be 30 mg/dL higher than patient's LDL goal (i.e. LDL goal of < 70 mg/dL, would have non-HDL goal of < 100 mg/dL)  TSH     Status: None   Collection Time: 02/11/15  9:11 AM  Result Value Ref Range   TSH 2.32 0.35 - 4.50 uIU/mL  Hemoglobin A1c     Status: None   Collection Time: 02/11/15  9:11 AM  Result Value Ref Range   Hgb A1c MFr Bld 6.3 4.6 - 6.5 %    Comment: Glycemic Control Guidelines for People with Diabetes:Non Diabetic:  <6%Goal of Therapy: <7%Additional Action Suggested:  >8%   Reticulocytes     Status: None   Collection Time: 03/10/15 10:32 AM  Result Value Ref Range   Reticulocyte Count 1.4 0.6 - 2.6 %  Ferritin     Status: Abnormal   Collection Time: 03/10/15 10:32 AM  Result  Value Ref Range   Ferritin 342 (H) 9 - 269 ng/ml  Iron and TIBC     Status: None   Collection Time: 03/10/15 10:32 AM  Result Value Ref Range   Iron 94 41 - 142 ug/dL   TIBC 284 236 - 444 ug/dL   UIBC 190 120 - 384 ug/dL   %SAT 33 21 - 57 %  CBC with Differential     Status: None   Collection Time: 03/10/15 10:32 AM  Result Value Ref Range   WBC 7.6 3.9 - 10.0 10e3/uL   RBC 4.44 3.70 - 5.32 10e6/uL   HGB 13.0 11.6 - 15.9 g/dL   HCT 38.1 34.8 - 46.6 %   MCV 86 81 - 101 fL   MCH 29.3 26.0 - 34.0 pg   MCHC 34.1 32.0 - 36.0 g/dL   RDW 12.4 11.1 - 15.7 %   Platelets 197 145 - 400 10e3/uL   NEUT# 4.3 1.5 - 6.5 10e3/uL   LYMPH# 2.5 0.9 - 3.3 10e3/uL   MONO# 0.7 0.1 - 0.9 10e3/uL   Eosinophils Absolute 0.1 0.0 - 0.5 10e3/uL   BASO# 0.0 0.0 - 0.2 10e3/uL   NEUT% 56.8  39.6 - 80.0 %   LYMPH% 32.4 14.0 - 48.0 %   MONO% 8.7 0.0 - 13.0 %   EOS% 1.6 0.0 - 7.0 %   BASO% 0.5 0.0 - 2.0 %    Assessment/Plan: Side pain Suspect strain of oblique muscle groups. Reproduced with ROM and palpation. No trauma or bony tenderness noted so do not feel imaging is warranted. Patient allergic to Tramadol and cannot tolerate oral NSAID. Recommend ES Tylenol, topical Aspercreme. Rest and heat to the area. Follow-up if not resolving.

## 2015-04-21 ENCOUNTER — Telehealth: Payer: Self-pay | Admitting: Family Medicine

## 2015-04-21 NOTE — Telephone Encounter (Signed)
Called and spoke with the [name] and informed her per Dr. Birdie Riddle that she will need to come back in to be reassessed.  Informed the [name] that Einar Pheasant is out of the office and will not return until Monday.  [name] verbalized understanding and agreed to schedule an appt to be rechecked.  Also informed the [name] that when she comes back in we can place the order for the referral to Dermatology.//AB/CMA

## 2015-04-21 NOTE — Telephone Encounter (Signed)
Pt called in stating that she was told to call if her side pain continued and an xray or other testing could be ordered. Pt is still having same issues as 04/05/15. Pt also states that a dermatology referral was supposed to be put in for her. I do not see one in the system so she has requested one.

## 2015-04-22 ENCOUNTER — Ambulatory Visit (INDEPENDENT_AMBULATORY_CARE_PROVIDER_SITE_OTHER): Payer: Medicare Other | Admitting: Family Medicine

## 2015-04-22 ENCOUNTER — Encounter: Payer: Self-pay | Admitting: Family Medicine

## 2015-04-22 ENCOUNTER — Ambulatory Visit (HOSPITAL_BASED_OUTPATIENT_CLINIC_OR_DEPARTMENT_OTHER)
Admission: RE | Admit: 2015-04-22 | Discharge: 2015-04-22 | Disposition: A | Discharge status: Home / Self Care | Payer: Medicare Other | Source: Ambulatory Visit | Attending: Family Medicine | Admitting: Family Medicine

## 2015-04-22 VITALS — BP 138/82 | HR 65 | Temp 98.0°F | Ht 61.0 in | Wt 174.1 lb

## 2015-04-22 DIAGNOSIS — E559 Vitamin D deficiency, unspecified: Secondary | ICD-10-CM

## 2015-04-22 DIAGNOSIS — I10 Essential (primary) hypertension: Secondary | ICD-10-CM

## 2015-04-22 DIAGNOSIS — K219 Gastro-esophageal reflux disease without esophagitis: Secondary | ICD-10-CM | POA: Diagnosis not present

## 2015-04-22 DIAGNOSIS — K589 Irritable bowel syndrome without diarrhea: Secondary | ICD-10-CM

## 2015-04-22 DIAGNOSIS — K909 Intestinal malabsorption, unspecified: Secondary | ICD-10-CM | POA: Diagnosis not present

## 2015-04-22 DIAGNOSIS — R109 Unspecified abdominal pain: Secondary | ICD-10-CM

## 2015-04-22 DIAGNOSIS — M47892 Other spondylosis, cervical region: Secondary | ICD-10-CM | POA: Diagnosis not present

## 2015-04-22 DIAGNOSIS — M546 Pain in thoracic spine: Secondary | ICD-10-CM | POA: Diagnosis not present

## 2015-04-22 DIAGNOSIS — K3184 Gastroparesis: Secondary | ICD-10-CM | POA: Diagnosis not present

## 2015-04-22 DIAGNOSIS — M545 Low back pain: Secondary | ICD-10-CM | POA: Diagnosis not present

## 2015-04-22 DIAGNOSIS — M542 Cervicalgia: Secondary | ICD-10-CM

## 2015-04-22 DIAGNOSIS — M47812 Spondylosis without myelopathy or radiculopathy, cervical region: Secondary | ICD-10-CM | POA: Diagnosis not present

## 2015-04-22 DIAGNOSIS — E7800 Pure hypercholesterolemia, unspecified: Secondary | ICD-10-CM

## 2015-04-22 HISTORY — DX: Cervicalgia: M54.2

## 2015-04-22 LAB — COMPREHENSIVE METABOLIC PANEL
ALK PHOS: 57 U/L (ref 39–117)
ALT: 27 U/L (ref 0–35)
AST: 24 U/L (ref 0–37)
Albumin: 4.3 g/dL (ref 3.5–5.2)
BILIRUBIN TOTAL: 0.6 mg/dL (ref 0.2–1.2)
BUN: 18 mg/dL (ref 6–23)
CO2: 26 mEq/L (ref 19–32)
CREATININE: 0.74 mg/dL (ref 0.40–1.20)
Calcium: 10 mg/dL (ref 8.4–10.5)
Chloride: 100 mEq/L (ref 96–112)
GFR: 81.53 mL/min (ref >60.00)
GLUCOSE: 182 mg/dL — AB (ref 70–99)
Potassium: 3.5 mEq/L (ref 3.5–5.1)
SODIUM: 135 meq/L (ref 135–145)
TOTAL PROTEIN: 7.4 g/dL (ref 6.0–8.3)

## 2015-04-22 NOTE — Assessment & Plan Note (Signed)
Well controlled, no changes to meds. Encouraged heart healthy diet such as the DASH diet and exercise as tolerated.  °

## 2015-04-22 NOTE — Assessment & Plan Note (Signed)
Avoid offending foods, start probiotics. Do not eat large meals in late evening and consider raising head of bed. Does note some nausea

## 2015-04-22 NOTE — Assessment & Plan Note (Signed)
With history of gastroparesis, reflux, gastric cancer will setup Ct to rule out an internal cause and treat MS with PT referral and topical care.

## 2015-04-22 NOTE — Progress Notes (Signed)
Pre visit review using our clinic review tool, if applicable. No additional management support is needed unless otherwise documented below in the visit note. 

## 2015-04-22 NOTE — Progress Notes (Signed)
Subjective:    [name], female    DOB: Mar 02, 1940, 75 y.o.   MRN: [id number]  Chief Complaint  Patient presents with  . Flank Pain  . Back Pain    HPI Patient is in today for radiating back pain. She denies any recent fall or injury but is complaining of right sided chest wall pain radiating from thoracic spine around to sternum worse with raising arms. Trying to sleep on that side and with certain movements. No rash. No SOB or other acute concern. No change in quality with eating. 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 No fevers, chills, anorexia. No change in bowel habits or bloody stool. Also notes ongoing ear pain, neck pain and low back pain.   Past Medical History  Diagnosis Date  . Gastroparesis   . Pure hypercholesterolemia   . Headache(784.0)   . Unspecified essential hypertension   . Esophageal reflux   . Type II or unspecified type diabetes mellitus without mention of complication, not stated as uncontrolled   . Arthritis     Spinal Osteoarthritis  . Diverticulosis 08/30/2000    Colonoscopy   . Gastric polyp     Fundic Gland  . Anxiety   . Cataract   . Heart murmur     Echocardiogram 2/11: EF 60-65%, mild LAE, grade 1 diastolic dysfunction, aortic valve sclerosis, mean gradient 9 mm of mercury, PASP 34  . Chest pain     Myoview 12/15 no ischemia.  . Carcinoid tumor of stomach   . PSVT (paroxysmal supraventricular tachycardia) ([place])   . Leg swelling     bilateral  . Chronic kidney disease     Left kidney smaller than right kidney  . Cancer ([place])   . PONV (postoperative nausea and vomiting)     pt states only needs small amount of anesthesia  . Stroke ([place])     tia, 2014  . Iron deficiency anemia, unspecified   . Encounter for preventative adult health care exam with abnormal findings 09/14/2013  . Unspecified hereditary and idiopathic peripheral neuropathy 10/29/2013  . Diabetic  peripheral neuropathy ([place]) 10/29/2013  . Diabetes mellitus type 2 in obese ([place]) 09/05/2006    Qualifier: Diagnosis of  By: Marca Ancona RMA, Lucy    . Anemia 06/08/2014  . 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 Neck pain 04/22/2015    Past Surgical History  Procedure Laterality Date  . Cholecystectomy  1993  . Tonsillectomy    . Colonoscopy  11/11/2010    diverticulosis  . Esophagogastroduodenoscopy  08/29/2010; 09/15/2010    Carcinoid tumor less than 1 cm in July 2012 not seen in August 2012 , gastritis, fundic gland polyps  . Eus  12/15/2010    Procedure: UPPER ENDOSCOPIC ULTRASOUND (EUS) LINEAR;  Surgeon: Owens Loffler, MD;  Location: [facility];  Service: Endoscopy;  Laterality: N/A;  . Esophagogastroduodenoscopy  05/16/2011  . Esophagogastroduodenoscopy  06/14/2012  . Dilatation & currettage/hysteroscopy with resectocope N/A 02/25/2013    Procedure: Attempted hysteroscopy with uterine perforation;  Surgeon: Jamey Reas de Berton Lan, MD;  Location: [facility];  Service: Gynecology;  Laterality: N/A;  . Laparoscopy N/A 02/25/2013    Procedure: Cystoscopy and laparoscopy with fulguration of uterine serosa;  Surgeon: Jamey Reas de Berton Lan, MD;  Location: [facility];  Service: Gynecology;  Laterality: N/A;  . Eye surgery Bilateral     Bi  lateral cateracts and bi lateral laser    Family History  Problem Relation Age of Onset  . Diabetes Mother   . Stroke Father     deceased age 53  . Heart disease Sister     deceased MI age 58  . Heart disease Brother     deceased MI age 39  . Colon cancer Neg Hx   . Esophageal cancer Neg Hx   . Stomach cancer Neg Hx   . Rectal cancer Neg Hx   . Diabetes Maternal Grandmother   . Hypertension Paternal Grandmother   . Diabetes Sister   . Heart disease Sister   . Hypertension Sister   . Hyperlipidemia Sister   . Diabetes Sister   . Heart disease Sister   . Hypertension Sister   . Hyperlipidemia Sister   . Diabetes  Brother   . Heart disease Brother   . Hypertension Brother   . Hyperlipidemia Brother     Social History   Social History  . Marital Status: Married    Spouse Name: N/A  . Number of Children: 0  . Years of Education: college   Occupational History  . Retail    Social History Main Topics  . Smoking status: Never Smoker   . Smokeless tobacco: Never Used     Comment: Never used tobacco  . Alcohol Use: No  . Drug Use: No  . Sexual Activity: No     Comment: lives alone, no dietary restrictions except avoid fresh veg, fruit, whole grains   Other Topics Concern  . Not on file   Social History Narrative   Patient was married ([name]) - widow   Patient does not have any children.   Patient is right-handed.   Patient has a BA degree.   One caffeine drink daily     Outpatient Prescriptions Prior to Visit  Medication Sig Dispense Refill  . acetaminophen (TYLENOL) 500 MG tablet Take 2 tablets (1,000 mg total) by mouth every 6 (six) hours as needed for moderate pain. 30 tablet 0  . amLODipine (NORVASC) 5 MG tablet Take 1 tablet by mouth  daily 90 tablet 1  . aspirin 81 MG tablet Take 81 mg by mouth daily.    . calcium-vitamin D (OSCAL WITH D) 500-200 MG-UNIT per tablet Take 1 tablet by mouth.    . cholecalciferol (VITAMIN D) 1000 UNITS tablet Take 1,000 Units by mouth daily.      Marland Kitchen dicyclomine (BENTYL) 10 MG capsule Take 1 capsule (10 mg total) by mouth 4 (four) times daily as needed. 120 capsule 6  . DIOVAN 320 MG tablet Take 1 tablet by mouth  daily 90 tablet 1  . esomeprazole (NEXIUM) 40 MG capsule Take 1 capsule (40 mg total) by mouth daily before breakfast. 30 capsule 11  . FOLBIC 2.5-25-2 MG TABS TAKE ONE TABLET BY MOUTH EVERY DAY 90 tablet 3  . glucose blood test strip Take once daily and as needed.  DX E11.9 100 each 3  . Lancets MISC Use as instructed once daily to check blood sugar. DX E11.9 (Patient taking differently: Use as instructed twice daily to check blood sugar.  DX E11.9) 200 each 2  . metFORMIN (GLUCOPHAGE-XR) 500 MG 24 hr tablet Take 1 tablet (500 mg total) by mouth 3 (three) times daily with meals. 270 tablet 1  . metoCLOPramide (REGLAN) 5 MG tablet Take 1 tablet by mouth two  times daily 180 tablet 1  . metoprolol (LOPRESSOR) 50 MG tablet Take  1 tablet by mouth two  times daily 180 tablet 1  . Multiple Vitamin (MULTIVITAMIN) tablet Take 1 tablet by mouth daily.      . ondansetron (ZOFRAN) 4 MG tablet Take 4 mg by mouth every 4 (four) hours as needed. For nausea      . potassium chloride (K-DUR,KLOR-CON) 10 MEQ tablet Take 1 tablet by mouth  daily 60 tablet 2  . rosuvastatin (CRESTOR) 10 MG tablet Take 1 tablet (10 mg total) by mouth daily. 90 tablet 1  . vitamin E 400 UNIT capsule Take 400 Units by mouth daily.    Marland Kitchen zoster vaccine live, PF, (ZOSTAVAX) 60454 UNT/0.65ML injection Inject 19,400 Units into the skin once. 1 each 0   No facility-administered medications prior to visit.    Allergies  Allergen Reactions  . Tramadol     Dizziness     Review of Systems  Constitutional: Positive for malaise/fatigue. Negative for fever.  HENT: Negative for congestion.   Eyes: Negative for blurred vision.  Respiratory: Negative for shortness of breath.   Cardiovascular: Negative for chest pain, palpitations and leg swelling.  Gastrointestinal: Positive for heartburn, nausea and abdominal pain. Negative for vomiting, diarrhea, constipation, blood in stool and melena.  Genitourinary: Negative for dysuria and frequency.  Musculoskeletal: Positive for myalgias, back pain and neck pain. Negative for falls.  Skin: Negative for rash.  Neurological: Negative for dizziness, loss of consciousness and headaches.  Endo/Heme/Allergies: Negative for environmental allergies.  Psychiatric/Behavioral: Negative for depression. The patient is nervous/anxious.        Objective:    Physical Exam  Constitutional: She is oriented to person, place, and time. She  appears well-developed and well-nourished. No distress.  HENT:  Head: Normocephalic and atraumatic.  Nose: Nose normal.  Eyes: Right eye exhibits no discharge. Left eye exhibits no discharge.  Neck: Normal range of motion. Neck supple.  Cardiovascular: Normal rate and regular rhythm.   No murmur heard. Pulmonary/Chest: Effort normal and breath sounds normal.  Abdominal: Soft. Bowel sounds are normal. There is no tenderness.  Musculoskeletal: She exhibits tenderness. She exhibits no edema.  With palpation, around right flank from spine, lower ribs.  Neurological: She is alert and oriented to person, place, and time.  Skin: Skin is warm and dry.  Psychiatric: She has a normal mood and affect.  Nursing note and vitals reviewed.   BP 138/82 mmHg  Pulse 65  Temp(Src) 98 F (36.7 C) (Oral)  Ht 5\' 1"  (1.549 m)  Wt 174 lb 2 oz (78.983 kg)  BMI 32.92 kg/m2  SpO2 100%  LMP 02/07/1992 Wt Readings from Last 3 Encounters:  04/22/15 174 lb 2 oz (78.983 kg)  04/05/15 174 lb 3.2 oz (79.017 kg)  03/11/15 176 lb (79.833 kg)     Lab Results  Component Value Date   WBC 7.6 03/10/2015   HGB 13.0 03/10/2015   HCT 38.1 03/10/2015   PLT 197 03/10/2015   GLUCOSE 182* 04/22/2015   CHOL 242* 02/11/2015   TRIG 159.0* 02/11/2015   HDL 69.10 02/11/2015   LDLDIRECT 123.7 04/13/2011   LDLCALC 141* 02/11/2015   ALT 27 04/22/2015   AST 24 04/22/2015   NA 135 04/22/2015   K 3.5 04/22/2015   CL 100 04/22/2015   CREATININE 0.74 04/22/2015   BUN 18 04/22/2015   CO2 26 04/22/2015   TSH 2.32 02/11/2015   INR 0.88 01/16/2011   HGBA1C 6.3 02/11/2015   MICROALBUR 1.6 11/13/2014    Lab Results  Component Value  Date   TSH 2.32 02/11/2015   Lab Results  Component Value Date   WBC 7.6 03/10/2015   HGB 13.0 03/10/2015   HCT 38.1 03/10/2015   MCV 86 03/10/2015   PLT 197 03/10/2015   Lab Results  Component Value Date   NA 135 04/22/2015   K 3.5 04/22/2015   CO2 26 04/22/2015   GLUCOSE 182*  04/22/2015   BUN 18 04/22/2015   CREATININE 0.74 04/22/2015   BILITOT 0.6 04/22/2015   ALKPHOS 57 04/22/2015   AST 24 04/22/2015   ALT 27 04/22/2015   PROT 7.4 04/22/2015   ALBUMIN 4.3 04/22/2015   CALCIUM 10.0 04/22/2015   ANIONGAP 16* 11/13/2013   GFR 81.53 04/22/2015   Lab Results  Component Value Date   CHOL 242* 02/11/2015   Lab Results  Component Value Date   HDL 69.10 02/11/2015   Lab Results  Component Value Date   LDLCALC 141* 02/11/2015   Lab Results  Component Value Date   TRIG 159.0* 02/11/2015   Lab Results  Component Value Date   CHOLHDL 4 02/11/2015   Lab Results  Component Value Date   HGBA1C 6.3 02/11/2015       Assessment & Plan:   Problem List Items Addressed This Visit    Essential hypertension    Well controlled, no changes to meds. Encouraged heart healthy diet such as the DASH diet and exercise as tolerated.       Relevant Orders   CT Abdomen Pelvis W Contrast   Comprehensive metabolic panel (Completed)   Ambulatory referral to Physical Therapy   DG Cervical Spine 2 or 3 views (Completed)   DG Thoracic Spine 2 View (Completed)   Gastroparesis   Relevant Orders   CT Abdomen Pelvis W Contrast   Comprehensive metabolic panel (Completed)   Ambulatory referral to Physical Therapy   DG Cervical Spine 2 or 3 views (Completed)   DG Thoracic Spine 2 View (Completed)   GERD (gastroesophageal reflux disease)    Avoid offending foods, start probiotics. Do not eat large meals in late evening and consider raising head of bed. Does note some nausea      Relevant Orders   CT Abdomen Pelvis W Contrast   Comprehensive metabolic panel (Completed)   Ambulatory referral to Physical Therapy   DG Cervical Spine 2 or 3 views (Completed)   DG Thoracic Spine 2 View (Completed)   HYPERCHOLESTEROLEMIA    Encouraged heart healthy diet, increase exercise, avoid trans fats, consider a krill oil cap daily      IBS (irritable bowel syndrome)   Relevant  Orders   CT Abdomen Pelvis W Contrast   Comprehensive metabolic panel (Completed)   Ambulatory referral to Physical Therapy   DG Cervical Spine 2 or 3 views (Completed)   DG Thoracic Spine 2 View (Completed)   Iron malabsorption (Chronic)   Relevant Orders   CT Abdomen Pelvis W Contrast   Comprehensive metabolic panel (Completed)   Ambulatory referral to Physical Therapy   DG Cervical Spine 2 or 3 views (Completed)   DG Thoracic Spine 2 View (Completed)   Left-sided thoracic back pain   Relevant Orders   CT Abdomen Pelvis W Contrast   Comprehensive metabolic panel (Completed)   Ambulatory referral to Physical Therapy   DG Cervical Spine 2 or 3 views (Completed)   DG Thoracic Spine 2 View (Completed)   Low back pain   Relevant Orders   CT Abdomen Pelvis W Contrast   Comprehensive  metabolic panel (Completed)   Ambulatory referral to Physical Therapy   DG Cervical Spine 2 or 3 views (Completed)   DG Thoracic Spine 2 View (Completed)   Neck pain    Xray. Encouraged moist heat and gentle stretching as tolerated. May try NSAIDs and prescription meds as directed and report if symptoms worsen or seek immediate care, lidocaine patches      Side pain    With history of gastroparesis, reflux, gastric cancer will setup Ct to rule out an internal cause and treat MS with PT referral and topical care.      Vitamin D deficiency - Primary   Relevant Orders   CT Abdomen Pelvis W Contrast   Comprehensive metabolic panel (Completed)   Ambulatory referral to Physical Therapy   DG Cervical Spine 2 or 3 views (Completed)   DG Thoracic Spine 2 View (Completed)      I am having Ms. Schaner maintain her multivitamin, cholecalciferol, ondansetron, acetaminophen, aspirin, calcium-vitamin D, FOLBIC, potassium chloride, vitamin E, Lancets, metFORMIN, rosuvastatin, zoster vaccine live (PF), esomeprazole, dicyclomine, amLODipine, glucose blood, metoprolol, DIOVAN, and metoCLOPramide.  No orders of  the defined types were placed in this encounter.     Penni Homans, MD

## 2015-04-22 NOTE — Assessment & Plan Note (Signed)
Encouraged heart healthy diet, increase exercise, avoid trans fats, consider a krill oil cap daily 

## 2015-04-22 NOTE — Assessment & Plan Note (Signed)
Xray. Encouraged moist heat and gentle stretching as tolerated. May try NSAIDs and prescription meds as directed and report if symptoms worsen or seek immediate care, lidocaine patches

## 2015-04-22 NOTE — Patient Instructions (Addendum)
Salonpas with lidocaine and Aspercreme will help with back and muscle pain.  Apply wet wash cloth the apply a heating pad for about 15 mins.   Back Pain, Adult Back pain is very common in adults.The cause of back pain is rarely dangerous and the pain often gets better over time.The cause of your back pain may not be known. Some common causes of back pain include:  Strain of the muscles or ligaments supporting the spine.  Wear and tear (degeneration) of the spinal disks.  Arthritis.  Direct injury to the back. For many people, back pain may return. Since back pain is rarely dangerous, most people can learn to manage this condition on their own. HOME CARE INSTRUCTIONS Watch your back pain for any changes. The following actions may help to lessen any discomfort you are feeling:  Remain active. It is stressful on your back to sit or stand in one place for long periods of time. Do not sit, drive, or stand in one place for more than 30 minutes at a time. Take short walks on even surfaces as soon as you are able.Try to increase the length of time you walk each day.  Exercise regularly as directed by your health care provider. Exercise helps your back heal faster. It also helps avoid future injury by keeping your muscles strong and flexible.  Do not stay in bed.Resting more than 1-2 days can delay your recovery.  Pay attention to your body when you bend and lift. The most comfortable positions are those that put less stress on your recovering back. Always use proper lifting techniques, including:  Bending your knees.  Keeping the load close to your body.  Avoiding twisting.  Find a comfortable position to sleep. Use a firm mattress and lie on your side with your knees slightly bent. If you lie on your back, put a pillow under your knees.  Avoid feeling anxious or stressed.Stress increases muscle tension and can worsen back pain.It is important to recognize when you are anxious or  stressed and learn ways to manage it, such as with exercise.  Take medicines only as directed by your health care provider. Over-the-counter medicines to reduce pain and inflammation are often the most helpful.Your health care provider may prescribe muscle relaxant drugs.These medicines help dull your pain so you can more quickly return to your normal activities and healthy exercise.  Apply ice to the injured area:  Put ice in a plastic bag.  Place a towel between your skin and the bag.  Leave the ice on for 20 minutes, 2-3 times a day for the first 2-3 days. After that, ice and heat may be alternated to reduce pain and spasms.  Maintain a healthy weight. Excess weight puts extra stress on your back and makes it difficult to maintain good posture. SEEK MEDICAL CARE IF:  You have pain that is not relieved with rest or medicine.  You have increasing pain going down into the legs or buttocks.  You have pain that does not improve in one week.  You have night pain.  You lose weight.  You have a fever or chills. SEEK IMMEDIATE MEDICAL CARE IF:   You develop new bowel or bladder control problems.  You have unusual weakness or numbness in your arms or legs.  You develop nausea or vomiting.  You develop abdominal pain.  You feel faint.   This information is not intended to replace advice given to you by your health care provider. Make sure  you discuss any questions you have with your health care provider.   Document Released: 01/23/2005 Document Revised: 02/13/2014 Document Reviewed: 05/27/2013 Elsevier Interactive Patient Education [employer].

## 2015-04-23 ENCOUNTER — Other Ambulatory Visit: Payer: Self-pay | Admitting: Family Medicine

## 2015-04-23 ENCOUNTER — Ambulatory Visit (HOSPITAL_BASED_OUTPATIENT_CLINIC_OR_DEPARTMENT_OTHER)
Admission: RE | Admit: 2015-04-23 | Discharge: 2015-04-23 | Disposition: A | Discharge status: Home / Self Care | Payer: Medicare Other | Source: Ambulatory Visit | Attending: Family Medicine | Admitting: Family Medicine

## 2015-04-23 ENCOUNTER — Telehealth: Payer: Self-pay | Admitting: Family Medicine

## 2015-04-23 ENCOUNTER — Encounter (HOSPITAL_BASED_OUTPATIENT_CLINIC_OR_DEPARTMENT_OTHER): Payer: Self-pay

## 2015-04-23 DIAGNOSIS — M546 Pain in thoracic spine: Secondary | ICD-10-CM

## 2015-04-23 DIAGNOSIS — I10 Essential (primary) hypertension: Secondary | ICD-10-CM | POA: Diagnosis not present

## 2015-04-23 DIAGNOSIS — K909 Intestinal malabsorption, unspecified: Secondary | ICD-10-CM

## 2015-04-23 DIAGNOSIS — K219 Gastro-esophageal reflux disease without esophagitis: Secondary | ICD-10-CM | POA: Diagnosis not present

## 2015-04-23 DIAGNOSIS — K5730 Diverticulosis of large intestine without perforation or abscess without bleeding: Secondary | ICD-10-CM | POA: Insufficient documentation

## 2015-04-23 DIAGNOSIS — M545 Low back pain: Secondary | ICD-10-CM | POA: Diagnosis not present

## 2015-04-23 DIAGNOSIS — M542 Cervicalgia: Secondary | ICD-10-CM

## 2015-04-23 DIAGNOSIS — K589 Irritable bowel syndrome without diarrhea: Secondary | ICD-10-CM

## 2015-04-23 DIAGNOSIS — N289 Disorder of kidney and ureter, unspecified: Secondary | ICD-10-CM | POA: Insufficient documentation

## 2015-04-23 DIAGNOSIS — K3184 Gastroparesis: Secondary | ICD-10-CM

## 2015-04-23 DIAGNOSIS — E559 Vitamin D deficiency, unspecified: Secondary | ICD-10-CM

## 2015-04-23 MED ORDER — IOHEXOL 300 MG/ML  SOLN
100.0000 mL | Freq: Once | INTRAMUSCULAR | Status: AC | PRN
  Administered 2015-04-23: 100 mL via INTRAVENOUS

## 2015-04-23 NOTE — Telephone Encounter (Signed)
The patient had a CT scan today and wanted to know if the contrast she drank could cause diarrhea?

## 2015-04-25 NOTE — Telephone Encounter (Signed)
Yes the contrast can cause diarrhea temporarily

## 2015-04-26 ENCOUNTER — Telehealth: Payer: Self-pay | Admitting: Family Medicine

## 2015-04-26 ENCOUNTER — Telehealth: Payer: Self-pay | Admitting: Internal Medicine

## 2015-04-26 NOTE — Telephone Encounter (Signed)
Patient informed. 

## 2015-04-26 NOTE — Telephone Encounter (Signed)
Spoke to the patient and she is scheduled to see Dr. Charlett Blake on 05/25/2015.  That was all she wanted was to confirm she had scheduled an appointment, but not until next month as nothing available until then

## 2015-04-26 NOTE — Telephone Encounter (Signed)
Received call from [facility] to schedule pt an appt to discuss results. Pt couldn't decide on time to come in based on PCP scheduled. Pt says that she need a call back from [place] again.

## 2015-04-26 NOTE — Telephone Encounter (Signed)
Patient had a CT for abdominal pain with Dr. Charlett Blake. The stomach was abnormal on CT.  Dr. Charlett Blake recommended a referral back to GI for EGD.  Patient would like Dr. Carlean Purl to please review the CT scan and advise if referral is necessary.  She is aware that Dr. Carlean Purl is out of the office this week and that we will get back to her next week.

## 2015-04-27 ENCOUNTER — Ambulatory Visit: Payer: Medicare Other | Admitting: Family Medicine

## 2015-04-27 NOTE — Telephone Encounter (Signed)
Patient notified. Recall placed 

## 2015-04-27 NOTE — Telephone Encounter (Signed)
I have reviewed things and do not think she needs an EGD now - changes are chronic. Let's place an EGD recall for September (1 year after last)

## 2015-04-28 ENCOUNTER — Other Ambulatory Visit: Payer: Self-pay | Admitting: Family Medicine

## 2015-04-29 ENCOUNTER — Telehealth: Payer: Self-pay | Admitting: Cardiology

## 2015-04-29 NOTE — Telephone Encounter (Signed)
Pt called in stating she had a CT done for her PCP Vivien Rossetti and her doctor told her that there was some calcification of the aorta. She felt this is something Dr. Percival Spanish needed to be aware of   Thanks

## 2015-04-29 NOTE — Telephone Encounter (Signed)
We can discuss this at her next appt.  Please call her to let her know.

## 2015-04-29 NOTE — Telephone Encounter (Signed)
Will make dr hochrein aware. 

## 2015-04-29 NOTE — Telephone Encounter (Signed)
Spoke with pt, aware of dr hochrein's recommendations. ?

## 2015-05-02 ENCOUNTER — Other Ambulatory Visit: Payer: Self-pay | Admitting: Family Medicine

## 2015-05-04 ENCOUNTER — Ambulatory Visit: Payer: Medicare Other | Admitting: Physical Therapy

## 2015-05-04 ENCOUNTER — Telehealth: Payer: Self-pay | Admitting: Family Medicine

## 2015-05-04 NOTE — Telephone Encounter (Signed)
Caller name: Self  Can be reached: [phone]   Reason for call: Please call patient about appointments that have been scheduled for her and she does not understand why

## 2015-05-04 NOTE — Telephone Encounter (Signed)
Answered the patients question regarding 2 PT referral were entered.  The patient was scheduled at 2 locations.  She needed explained why it was done twice.  I informed per [facility] 2 orders were placed by mistake.  Patient verbally understood and agreed to go to PT at [facility].

## 2015-05-12 ENCOUNTER — Ambulatory Visit: Payer: Medicare Other | Attending: Family Medicine | Admitting: Physical Therapy

## 2015-05-12 DIAGNOSIS — R262 Difficulty in walking, not elsewhere classified: Secondary | ICD-10-CM | POA: Diagnosis not present

## 2015-05-12 DIAGNOSIS — M5441 Lumbago with sciatica, right side: Secondary | ICD-10-CM

## 2015-05-12 DIAGNOSIS — M542 Cervicalgia: Secondary | ICD-10-CM | POA: Insufficient documentation

## 2015-05-12 NOTE — Therapy (Addendum)
[facility] [address]  [place], [place], [address] Phone: [phone]   Fax:  [phone]  Physical Therapy Evaluation  Patient Details  Name: [name] MRN: [id number] Date of Birth: October 21, 1949 Referring Provider: [name], MD  Encounter Date: 05/12/2015      PT End of Session - 05/12/15 1316    Visit Number 1   Number of Visits 16   Date for PT Re-Evaluation 07/07/15   PT Start Time 1021   PT Stop Time 1112   PT Time Calculation (min) 51 min   Activity Tolerance Patient limited by pain   Behavior During Therapy [facility] for tasks assessed/performed      Past Medical History  Diagnosis Date  . Gastroparesis   . Pure hypercholesterolemia   . Headache(784.0)   . Unspecified essential hypertension   . Esophageal reflux   . Type II or unspecified type diabetes mellitus without mention of complication, not stated as uncontrolled   . Arthritis     Spinal Osteoarthritis  . Diverticulosis 08/30/2000    Colonoscopy   . Gastric polyp     Fundic Gland  . Anxiety   . Cataract   . Heart murmur     Echocardiogram 2/11: EF 60-65%, mild LAE, grade 1 diastolic dysfunction, aortic valve sclerosis, mean gradient 9 mm of mercury, PASP 34  . Chest pain     Myoview 12/15 no ischemia.  . Carcinoid tumor of stomach   . PSVT (paroxysmal supraventricular tachycardia) ([place])   . Leg swelling     bilateral  . Chronic kidney disease     Left kidney smaller than right kidney  . Cancer ([place])   . PONV (postoperative nausea and vomiting)     pt states only needs small amount of anesthesia  . Stroke ([place])     tia, 2014  . Iron deficiency anemia, unspecified   . Encounter for preventative adult health care exam with abnormal findings 09/14/2013  . Unspecified hereditary and idiopathic peripheral neuropathy 10/29/2013  . Diabetic peripheral neuropathy ([place]) 10/29/2013  . Diabetes mellitus type 2 in obese ([place]) 09/05/2006     Qualifier: Diagnosis of  By: Marca Ancona RMA, Lucy    . Anemia 06/08/2014  . 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 Neck pain 04/22/2015    Past Surgical History  Procedure Laterality Date  . Cholecystectomy  1993  . Tonsillectomy    . Colonoscopy  11/11/2010    diverticulosis  . Esophagogastroduodenoscopy  08/29/2010; 09/15/2010    Carcinoid tumor less than 1 cm in July 2012 not seen in August 2012 , gastritis, fundic gland polyps  . Eus  12/15/2010    Procedure: UPPER ENDOSCOPIC ULTRASOUND (EUS) LINEAR;  Surgeon: Owens Loffler, MD;  Location: [facility];  Service: Endoscopy;  Laterality: N/A;  . Esophagogastroduodenoscopy  05/16/2011  . Esophagogastroduodenoscopy  06/14/2012  . Dilatation & currettage/hysteroscopy with resectocope N/A 02/25/2013    Procedure: Attempted hysteroscopy with uterine perforation;  Surgeon: Jamey Reas de Berton Lan, MD;  Location: [facility];  Service: Gynecology;  Laterality: N/A;  . Laparoscopy N/A 02/25/2013    Procedure: Cystoscopy and laparoscopy with fulguration of uterine serosa;  Surgeon: Jamey Reas de Berton Lan, MD;  Location: [facility];  Service: Gynecology;  Laterality: N/A;  . Eye surgery Bilateral     Bi lateral cateracts and bi lateral laser    There were no vitals filed for this  visit.  Visit Diagnosis:  Bilateral low back pain with right-sided sciatica  Difficulty in walking, not elsewhere classified      Subjective Assessment - 05/12/15 1025    Subjective Pt reporting new onset of LBP a few weeks ago with pain originating in R side and then settling into low back, primarily on R side with radicular pain/numbness/burning down R leg to foot. Pain prevents pt from sleeping on her R side as she normally would. Pain worst in the morning upon rising from bed.    Pertinent History h/o LBP in 2014 but pt with poor recall of details; PT for neck pain 09/14/2014-11/05/2014   Limitations Sitting;Standing;Walking   How long  can you sit comfortably? 20 minutes   How long can you stand comfortably? pt uncertain (states standing better than sitting, but lying down is best)   How long can you walk comfortably? pt uncertain   Diagnostic tests No recent lumbar studies but X-ray from 06/03/12: Alignment is anatomic.  Vertebral body height is maintained.  Mild endplate degenerative changes are seen in the lower thoracic spine and throughout the lumbar spine, with exception of L2-3.  Mild loss of disc space height at L1-2 and L3-4.  Facet hypertrophy L5-S1.  No definite pars defects. Mild multilevel spondylosis.   Patient Stated Goals "Make the pain better"   Currently in Pain? Yes   Pain Score 10-Worst pain ever  Least 5/10, Avg 8/10, Worst 10/10   Pain Location Back   Pain Orientation Mid;Lower;Right   Pain Descriptors / Indicators Burning   Pain Type Acute pain   Pain Radiating Towards numbness/burning down R leg to foot   Pain Onset 1 to 4 weeks ago   Pain Frequency Constant   Aggravating Factors  Sleeping on R side   Pain Relieving Factors heating pad, Tylenol   Effect of Pain on Daily Activities Unable to bathe/shower at times due to pain, Unable to bend to pick up objects from floor, Unable to perform household chores such as cleaning or vacuuming; Pain prevents pt from sleeping on R side as she would normally do so            [facility] PT Assessment - 05/12/15 1021    Assessment   Medical Diagnosis Low back pain with sciatica   Referring Provider [name], MD   Onset Date/Surgical Date 04/22/15   Next MD Visit 05/25/15   Prior Therapy PT for neck pain 09/14/2014-11/05/2014   Balance Screen   Has the patient fallen in the past 6 months No   Has the patient had a decrease in activity level because of a fear of falling?  Yes   Is the patient reluctant to leave their home because of a fear of falling?  No   Home Environment   Living Environment Private residence   Living Arrangements Alone   Type of [place] Access Level entry   Home Layout One level   Prior Function   Level of Independence Independent   Vocation Retired   Catering manager, reading, traveling   Observation/Other Assessments   Focus on Therapeutic Outcomes (FOTO)  Lumbar Spine - 45% (55% limitation); Predicted 61% (39% limitation)   Posture/Postural Control   Posture/Postural Control Postural limitations   Postural Limitations Forward head;Rounded Shoulders;Increased thoracic kyphosis;Decreased lumbar lordosis   ROM / Strength   AROM / PROM / Strength AROM;Strength   AROM   Overall AROM Comments pain with all lumbar ROM  AROM Assessment Site Lumbar   Lumbar Flexion 25%   Lumbar Extension 10%   Lumbar - Right Side Bend 20%   Lumbar - Left Side Bend 20%   Lumbar - Right Rotation 20%   Lumbar - Left Rotation 30%   Flexibility   Soft Tissue Assessment /Muscle Length yes  assessment limited by pain and pt guarding   Hamstrings moderately tight bilaterally, R>L, with pain on R   Piriformis moderately tight bilaterally, R>L, with pain on R   Palpation   Palpation comment ttp over R low back and anterior/lateral/posterior R hip   Special Tests    Special Tests Lumbar   Lumbar Tests FABER test;Straight Leg Raise   FABER test   findings Positive   Side Right   Straight Leg Raise   Findings Positive   Side  Right                             PT Short Term Goals - 05/12/15 1410    PT SHORT TERM GOAL #1   Title Independent with initial HEP by 06/09/15   Status New           PT Long Term Goals - 05/12/15 1411    PT LONG TERM GOAL #1   Title Independent with advanced HEP as indicated by 07/07/15   Status New   PT LONG TERM GOAL #2   Title Pt will demonstrate lumbar ROM 50% of normal or greater w/o increased pain by 07/07/15   Status New   PT LONG TERM GOAL #3   Title Pt will tolerate sitting for 30 minutes or greater without increased low back or leg pain while watching TV or  working on computer by 07/07/15   Status New   PT LONG TERM GOAL #4   Title Pt will report ability to perform normal ADL's and light household chores without increased low back or leg pain by 07/07/15   Status New               Plan - 05/12/15 1348    Clinical Impression Statement Pt is a 75 y/o female who presents to OP PT with chief complaint of bilateral (R>L) low back pain with R-side radicular pain/numbness/burning down R LE to foot. Pt states pain currently 10/10 but demonstrates no signs of acute distress due to pain. Pain does limit pt's tolerance for assessment with lumbar ROM severaly limited in all planes to 10-30% of normal motion, moderate to signficant tghtness in hamstrings and piriformis bilaterally R>L with guarding noted with all muscle length assessment. Unable to test LE strength due to pain. Pt reports pain interferes with all aspects of daily life including sleeping; sitting,standing and walking tolerance; ADL's including bathing and dressing; and household chores. Due to high pain report and limited tolerance for assessment, attempted to address pain with estim and moist heat but pt only wanting to try moist heat therefore moist heat applied to low back and R hip in L sidelying x 10 minutes.                        Pt will benefit from skilled therapeutic intervention in order to improve on the following deficits Pain;Impaired flexibility;Decreased range of motion;Increased muscle spasms;Decreased strength;Difficulty walking;Decreased activity tolerance;Postural dysfunction;Impaired perceived functional ability;Improper body mechanics   Rehab Potential Good   Clinical Impairments Affecting Rehab Potential Age, medically complex PMH, chronic pain  PT Frequency 2x / week   PT Duration 8 weeks   PT Treatment/Interventions Patient/family education;Manual techniques;Passive range of motion;Taping;Therapeutic exercise;Therapeutic activities;Functional mobility training;Gait  training;Neuromuscular re-education;Moist Heat;Electrical Stimulation;Cryotherapy;Traction   PT Next Visit Plan Manual therapy and modalities PRN to manage pain, Initiate basic lumbar flexibility and stabilization exercises as tolerance and create HEP as appropriate   Consulted and Agree with Plan of Care Patient        G-Codes - 2015-06-03 1115   Functional Assessment Tool Used Lumbar Spine FOTO = 45% (55% limitation)   Functional Limitation Changing and maintaining body position   Changing and Maintaining Body Position Current Status [id number]) At least 40 percent but less than 60 percent impaired, limited or restricted   Changing and Maintaining Body Position Goal Status [id number]) At least 20 percent but less than 40 percent impaired, limited or restricted            Problem List Patient Active Problem List   Diagnosis Date Noted  . Neck pain 04/22/2015  . Side pain 04/05/2015  . Ear pain 02/28/2015  . Abnormal urine 11/21/2014  . 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 Anemia 06/08/2014  . Dysuria 01/07/2014  . RLS (restless legs syndrome) 11/23/2013  . Elevated troponin 11/13/2013  . Chest pain 11/13/2013  . Diabetic peripheral neuropathy ([place]) 10/29/2013  . Left-sided thoracic back pain 10/06/2013  . Encounter for preventative adult health care exam with abnormal findings 09/14/2013  . Iron deficiency anemia   . Status post laparoscopy 02/25/2013  . GERD (gastroesophageal reflux disease) 01/09/2013  . Amaurosis fugax of left eye 10/16/2012  . Low back pain 06/03/2012  . Vitamin D deficiency 03/11/2012  . Bilateral hand pain 10/20/2011  . Encounter for long-term (current) use of other medications 10/20/2011  . IBS (irritable bowel syndrome) 08/14/2011  . TIA (transient ischemic attack) 02/10/2011  . Abnormal brain MRI 01/19/2011  . Allergic rhinitis 10/01/2010  . 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 FUNDIC GLAND POLYPS OF  STOMACH 03/18/2010  . EPIGASTRIC PAIN chronic 03/18/2010  . ARTHRALGIA 03/17/2009  . SYSTOLIC MURMUR [id number]  . Paroxysmal supraventricular tachycardia ([place]) 01/12/2009  . PLANTAR FASCIITIS 06/08/2008  . CHEST PAIN 05/18/2008  . Gastroparesis 12/18/2007  . HYPERCHOLESTEROLEMIA 06/11/2007  . Diabetes mellitus type 2 in obese ([place]) 09/05/2006  . Essential hypertension 09/05/2006    Percival Spanish, PT, MPT Jun 03, 2015, 2:22 PM  [facility] [address]  [place], [place], [address] Phone: [phone]   Fax:  [phone]  Name: JACALYN SOULSBY MRN: [id number] Date of Birth: 07-08-1940   Percival Spanish, PT, MPT 05/28/2015, 1:39 PM  [facility] [address]  [place], [place], [address] Phone: [phone]   Fax:  [phone]

## 2015-05-14 ENCOUNTER — Ambulatory Visit (INDEPENDENT_AMBULATORY_CARE_PROVIDER_SITE_OTHER): Payer: Medicare Other | Admitting: Cardiology

## 2015-05-14 ENCOUNTER — Encounter: Payer: Self-pay | Admitting: Cardiology

## 2015-05-14 VITALS — BP 152/88 | HR 70 | Ht 61.0 in | Wt 173.0 lb

## 2015-05-14 DIAGNOSIS — R079 Chest pain, unspecified: Secondary | ICD-10-CM | POA: Diagnosis not present

## 2015-05-14 NOTE — Patient Instructions (Signed)
Your physician wants you to follow-up in: 6 Months You will receive a reminder letter in the mail two months in advance. If you don't receive a letter, please call our office to schedule the follow-up appointment.  

## 2015-05-14 NOTE — Progress Notes (Signed)
HPI The patient presents for followup of SVT.   Since I last saw her she has had rare palpitations but none of the problems that  brought her to the hospital.   The patient denies any new symptoms such as chest discomfort, neck or arm discomfort. There has been no new shortness of breath, PND or orthopnea. There have been no reported palpitations, presyncope or syncope.  She is still not exercising.   Of note she did have a CT recently ans was noted to have some aortic calcification.     Allergies  Allergen Reactions  . Tramadol     Dizziness     Current Outpatient Prescriptions  Medication Sig Dispense Refill  . acetaminophen (TYLENOL) 500 MG tablet Take 2 tablets (1,000 mg total) by mouth every 6 (six) hours as needed for moderate pain. 30 tablet 0  . amLODipine (NORVASC) 5 MG tablet Take 1 tablet by mouth  daily 90 tablet 1  . aspirin 81 MG tablet Take 81 mg by mouth daily.    . calcium-vitamin D (OSCAL WITH D) 500-200 MG-UNIT per tablet Take 1 tablet by mouth.    . cholecalciferol (VITAMIN D) 1000 UNITS tablet Take 1,000 Units by mouth daily.      Marland Kitchen dicyclomine (BENTYL) 10 MG capsule Take 1 capsule (10 mg total) by mouth 4 (four) times daily as needed. 120 capsule 6  . DIOVAN 320 MG tablet Take 1 tablet by mouth  daily 90 tablet 1  . esomeprazole (NEXIUM) 40 MG capsule Take 1 capsule (40 mg total) by mouth daily before breakfast. 30 capsule 11  . FOLBIC 2.5-25-2 MG TABS tablet TAKE 1 TABLET BY MOUTH EVERY DAY 90 tablet 3  . glucose blood test strip Take once daily and as needed.  DX E11.9 100 each 3  . Lancets MISC Use as instructed once daily to check blood sugar. DX E11.9 (Patient taking differently: Use as instructed twice daily to check blood sugar. DX E11.9) 200 each 2  . metFORMIN (GLUCOPHAGE-XR) 500 MG 24 hr tablet Take 1 tablet by mouth 3  times daily with meals 270 tablet 1  . metoCLOPramide (REGLAN) 5 MG tablet Take 1 tablet by mouth two  times daily 180 tablet 1  .  metoprolol (LOPRESSOR) 50 MG tablet Take 1 tablet by mouth two  times daily 180 tablet 1  . Multiple Vitamin (MULTIVITAMIN) tablet Take 1 tablet by mouth daily.      . ondansetron (ZOFRAN) 4 MG tablet Take 4 mg by mouth every 4 (four) hours as needed. For nausea      . potassium chloride (K-DUR,KLOR-CON) 10 MEQ tablet Take 1 tablet by mouth  daily 60 tablet 2  . rosuvastatin (CRESTOR) 10 MG tablet Take 1 tablet (10 mg total) by mouth daily. 90 tablet 1  . vitamin E 400 UNIT capsule Take 400 Units by mouth daily.    Marland Kitchen zoster vaccine live, PF, (ZOSTAVAX) 09811 UNT/0.65ML injection Inject 19,400 Units into the skin once. 1 each 0   No current facility-administered medications for this visit.    Past Medical History  Diagnosis Date  . Gastroparesis   . Pure hypercholesterolemia   . Headache(784.0)   . Unspecified essential hypertension   . Esophageal reflux   . Type II or unspecified type diabetes mellitus without mention of complication, not stated as uncontrolled   . Arthritis     Spinal Osteoarthritis  . Diverticulosis 08/30/2000    Colonoscopy   . Gastric polyp  Fundic Gland  . Anxiety   . Cataract   . Heart murmur     Echocardiogram 2/11: EF 60-65%, mild LAE, grade 1 diastolic dysfunction, aortic valve sclerosis, mean gradient 9 mm of mercury, PASP 34  . Chest pain     Myoview 12/15 no ischemia.  . Carcinoid tumor of stomach   . PSVT (paroxysmal supraventricular tachycardia) ([place])   . Leg swelling     bilateral  . Chronic kidney disease     Left kidney smaller than right kidney  . Cancer ([place])   . PONV (postoperative nausea and vomiting)     pt states only needs small amount of anesthesia  . Stroke ([place])     tia, 2014  . Iron deficiency anemia, unspecified   . Encounter for preventative adult health care exam with abnormal findings 09/14/2013  . Unspecified hereditary and idiopathic peripheral neuropathy 10/29/2013  . Diabetic peripheral neuropathy ([place]) 10/29/2013  .  Diabetes mellitus type 2 in obese ([place]) 09/05/2006    Qualifier: Diagnosis of  By: Marca Ancona RMA, Lucy    . Anemia 06/08/2014  . 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 Neck pain 04/22/2015    Past Surgical History  Procedure Laterality Date  . Cholecystectomy  1993  . Tonsillectomy    . Colonoscopy  11/11/2010    diverticulosis  . Esophagogastroduodenoscopy  08/29/2010; 09/15/2010    Carcinoid tumor less than 1 cm in July 2012 not seen in August 2012 , gastritis, fundic gland polyps  . Eus  12/15/2010    Procedure: UPPER ENDOSCOPIC ULTRASOUND (EUS) LINEAR;  Surgeon: Owens Loffler, MD;  Location: [facility];  Service: Endoscopy;  Laterality: N/A;  . Esophagogastroduodenoscopy  05/16/2011  . Esophagogastroduodenoscopy  06/14/2012  . Dilatation & currettage/hysteroscopy with resectocope N/A 02/25/2013    Procedure: Attempted hysteroscopy with uterine perforation;  Surgeon: Jamey Reas de Berton Lan, MD;  Location: [facility];  Service: Gynecology;  Laterality: N/A;  . Laparoscopy N/A 02/25/2013    Procedure: Cystoscopy and laparoscopy with fulguration of uterine serosa;  Surgeon: Jamey Reas de Berton Lan, MD;  Location: [facility];  Service: Gynecology;  Laterality: N/A;  . Eye surgery Bilateral     Bi lateral cateracts and bi lateral laser    ROS:  As stated in the HPI and negative for all other systems.  PHYSICAL EXAM BP 152/88 mmHg  Pulse 70  Ht 5\' 1"  (1.549 m)  Wt 173 lb (78.472 kg)  BMI 32.70 kg/m2  SpO2 97%  LMP 02/07/1992 GENERAL:  Well appearing NECK:  No jugular venous distention, waveform within normal limits, carotid upstroke brisk and symmetric, no bruits, no thyromegaly LUNGS:  Clear to auscultation bilaterally BACK:  No CVA tenderness CHEST:  Unremarkable HEART:  PMI not displaced or sustained,S1 and S2 within normal limits, no S3, no S4, no clicks, no rubs, slight systolic murmur peaking and heard best at the right upper sternal border, no  diastolicmurmurs ABD:  Flat, positive bowel sounds normal in frequency in pitch, no bruits, no rebound, no guarding, no midline pulsatile mass, no hepatomegaly, no splenomegaly EXT:  2 plus pulses throughout, mild bilateral ankle edema, no cyanosis no clubbing PSYCH:  Tearful  EKG:  Sinus rhythm, rate 70, axis within normal limits, intervals within normal limits, no acute ST-T wave changes. 05/14/2015   ASSESSMENT AND PLAN  Supraventricular Tachycardia - She's had no further sustained dysrhythmias. No change in therapy is indicated.  Hypertension - The blood pressure  is not at target.  However, she does not want to take more medications.  I told her the only way she would stay off of more medications if she exercised and she promises me she will try to do this.  Dyslipidemia -  She does not want to take more medications.    We had a long discussion about diet and gave her specific instructions on how to change this and she can have a repeat lipid later this year. Hopefully she will comply. Lab Results  Component Value Date   CHOL 242* 02/11/2015   TRIG 159.0* 02/11/2015   HDL 69.10 02/11/2015   LDLCALC 141* 02/11/2015   LDLDIRECT 123.7 04/13/2011    Carotid stenosis - She has some mild plaque in September of 2016.  No follow up is indicated.   Murmur - This is mild aortic stenosis. We will follow this clinically.

## 2015-05-18 ENCOUNTER — Ambulatory Visit: Payer: Medicare Other | Admitting: Physical Therapy

## 2015-05-18 DIAGNOSIS — M5441 Lumbago with sciatica, right side: Secondary | ICD-10-CM | POA: Diagnosis not present

## 2015-05-18 DIAGNOSIS — M542 Cervicalgia: Secondary | ICD-10-CM | POA: Diagnosis not present

## 2015-05-18 DIAGNOSIS — R262 Difficulty in walking, not elsewhere classified: Secondary | ICD-10-CM | POA: Diagnosis not present

## 2015-05-18 NOTE — Therapy (Addendum)
[facility] [address]  [place], [place], [address] Phone: [phone]   Fax:  [phone]  Physical Therapy Treatment  Patient Details  Name: [name] MRN: [id number] Date of Birth: 05/18/1940 Referring Provider: [name], MD  Encounter Date: 05/18/2015      PT End of Session - 05/18/15 1023    Visit Number 2   Number of Visits 16   Date for PT Re-Evaluation 07/07/15   PT Start Time 1019   PT Stop Time 1111   PT Time Calculation (min) 52 min   Activity Tolerance Patient tolerated treatment well;Patient limited by pain   Behavior During Therapy [facility] for tasks assessed/performed      Past Medical History  Diagnosis Date  . Gastroparesis   . Pure hypercholesterolemia   . Headache(784.0)   . Unspecified essential hypertension   . Esophageal reflux   . Type II or unspecified type diabetes mellitus without mention of complication, not stated as uncontrolled   . Arthritis     Spinal Osteoarthritis  . Diverticulosis 08/30/2000    Colonoscopy   . Gastric polyp     Fundic Gland  . Anxiety   . Cataract   . Heart murmur     Echocardiogram 2/11: EF 60-65%, mild LAE, grade 1 diastolic dysfunction, aortic valve sclerosis, mean gradient 9 mm of mercury, PASP 34  . Chest pain     Myoview 12/15 no ischemia.  . Carcinoid tumor of stomach   . PSVT (paroxysmal supraventricular tachycardia) ([place])   . Leg swelling     bilateral  . Chronic kidney disease     Left kidney smaller than right kidney  . Cancer ([place])   . PONV (postoperative nausea and vomiting)     pt states only needs small amount of anesthesia  . Stroke ([place])     tia, 2014  . Iron deficiency anemia, unspecified   . Encounter for preventative adult health care exam with abnormal findings 09/14/2013  . Unspecified hereditary and idiopathic peripheral neuropathy 10/29/2013  . Diabetic peripheral neuropathy ([place]) 10/29/2013  . Diabetes  mellitus type 2 in obese ([place]) 09/05/2006    Qualifier: Diagnosis of  By: Marca Ancona RMA, Lucy    . Anemia 06/08/2014  . 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 Neck pain 04/22/2015    Past Surgical History  Procedure Laterality Date  . Cholecystectomy  1993  . Tonsillectomy    . Colonoscopy  11/11/2010    diverticulosis  . Esophagogastroduodenoscopy  08/29/2010; 09/15/2010    Carcinoid tumor less than 1 cm in July 2012 not seen in August 2012 , gastritis, fundic gland polyps  . Eus  12/15/2010    Procedure: UPPER ENDOSCOPIC ULTRASOUND (EUS) LINEAR;  Surgeon: Owens Loffler, MD;  Location: [facility];  Service: Endoscopy;  Laterality: N/A;  . Esophagogastroduodenoscopy  05/16/2011  . Esophagogastroduodenoscopy  06/14/2012  . Dilatation & currettage/hysteroscopy with resectocope N/A 02/25/2013    Procedure: Attempted hysteroscopy with uterine perforation;  Surgeon: Jamey Reas de Berton Lan, MD;  Location: [facility];  Service: Gynecology;  Laterality: N/A;  . Laparoscopy N/A 02/25/2013    Procedure: Cystoscopy and laparoscopy with fulguration of uterine serosa;  Surgeon: Jamey Reas de Berton Lan, MD;  Location: [facility];  Service: Gynecology;  Laterality: N/A;  . Eye surgery Bilateral     Bi lateral cateracts and bi lateral laser    There were no vitals  filed for this visit.      Subjective Assessment - 05/18/15 1020    Currently in Pain? Yes   Pain Score 9    Pain Location Back   Pain Orientation Lower   Pain Descriptors / Indicators Pins and needles   Pain Radiating Towards into both hips          Today's Treatment  TherEx NuStep - lvl 3 x 3' Hooklying Abdominal bracing 10x3" Hooklying Pelvic tilt 10x3" DKTC with feet on peanut ball 10x3" SKTC stretch 2x20" LTR 5x5" Manual B Hamstring stretch 2x30" Bridge 2x5x3" TrA + Hooklying Hip ABD/ER fall-out x5 TrA + Hooklying March x5   Manual  Manual traction 4x30"  Modalities Moist heat pack to  R Low back & Hip in L sidelying x10'         PT Education - 05/18/15 1100    Education provided Yes   Education Details Initial HEP   Person(s) Educated Patient   Methods Explanation;Demonstration;Handout;Tactile cues;Verbal cues   Comprehension Verbalized understanding;Returned demonstration;Verbal cues required;Tactile cues required;Need further instruction          PT Short Term Goals - 05/18/15 1214    PT SHORT TERM GOAL #1   Title Independent with initial HEP by 06/09/15   Status On-going           PT Long Term Goals - 05/18/15 1214    PT LONG TERM GOAL #1   Title Independent with advanced HEP as indicated by 07/07/15   Status On-going   PT LONG TERM GOAL #2   Title Pt will demonstrate lumbar ROM 50% of normal or greater w/o increased pain by 07/07/15   Status On-going   PT LONG TERM GOAL #3   Title Pt will tolerate sitting for 30 minutes or greater without increased low back or leg pain while watching TV or working on computer by 07/07/15   Status On-going   PT LONG TERM GOAL #4   Title Pt will report ability to perform normal ADL's and light household chores without increased low back or leg pain by 07/07/15   Status On-going               Plan - 05/18/15 1207    Clinical Impression Statement Pt continues to report 9-10/10 pain but no signs of acute distress due to pain. Initated basic lumbar flexibility and stabilization exercises with flexion bias and created initial HEP. Pt able to complete all exercises without increased pain reported. Provided gentle manual traction with patient unable to tell if pain/symptoms lessened or worsened with attempt. Will consider trial of static lumbar traction in hooklying with neutral pull at low load on next visit if pt willing. Pt noting benefit from moist heat at end of last session and requested to end session with heat today, but remians reluctant to try estim.   PT Treatment/Interventions Patient/family education;Manual  techniques;Passive range of motion;Taping;Therapeutic exercise;Therapeutic activities;Functional mobility training;Gait training;Neuromuscular re-education;Moist Heat;Electrical Stimulation;Cryotherapy;Traction   PT Next Visit Plan Assess response to HEP and review as needed, Manual therapy and modalities PRN to manage pain, Progress lumbar flexibility and stabilization exercises as tolerated and update HEP as appropriate, ?Mechanical traction - Static L-spine, Hooklying supine with neutral pull, Low load (30-40#)   Consulted and Agree with Plan of Care Patient      Patient will benefit from skilled therapeutic intervention in order to improve the following deficits and impairments:  Pain, Impaired flexibility, Decreased range of motion, Increased muscle spasms, Decreased strength, Difficulty  walking, Decreased activity tolerance, Postural dysfunction, Impaired perceived functional ability, Improper body mechanics  Visit Diagnosis: Bilateral low back pain with right-sided sciatica  Difficulty in walking, not elsewhere classified     Problem List Patient Active Problem List   Diagnosis Date Noted  . Neck pain 04/22/2015  . Side pain 04/05/2015  . Ear pain 02/28/2015  . Abnormal urine 11/21/2014  . 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 Anemia 06/08/2014  . Dysuria 01/07/2014  . RLS (restless legs syndrome) 11/23/2013  . Elevated troponin 11/13/2013  . Chest pain 11/13/2013  . Diabetic peripheral neuropathy ([place]) 10/29/2013  . Left-sided thoracic back pain 10/06/2013  . Encounter for preventative adult health care exam with abnormal findings 09/14/2013  . Iron deficiency anemia   . Status post laparoscopy 02/25/2013  . GERD (gastroesophageal reflux disease) 01/09/2013  . Amaurosis fugax of left eye 10/16/2012  . Low back pain 06/03/2012  . Vitamin D deficiency 03/11/2012  . Bilateral hand pain 10/20/2011  . Encounter for long-term (current) use of other  medications 10/20/2011  . IBS (irritable bowel syndrome) 08/14/2011  . TIA (transient ischemic attack) 02/10/2011  . Abnormal brain MRI 01/19/2011  . Allergic rhinitis 10/01/2010  . 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 FUNDIC GLAND POLYPS OF STOMACH 03/18/2010  . EPIGASTRIC PAIN chronic 03/18/2010  . ARTHRALGIA 03/17/2009  . SYSTOLIC MURMUR [id number]  . Paroxysmal supraventricular tachycardia ([place]) 01/12/2009  . PLANTAR FASCIITIS 06/08/2008  . CHEST PAIN 05/18/2008  . Gastroparesis 12/18/2007  . HYPERCHOLESTEROLEMIA 06/11/2007  . Diabetes mellitus type 2 in obese ([place]) 09/05/2006  . Essential hypertension 09/05/2006    Percival Spanish, PT, MPT 05/18/2015, 8:48 PM  [facility] [address]  [place], [place], [address] Phone: [phone]   Fax:  [phone]  Name: VYSHNAVI CIFELLI MRN: [place]:[id number] Date of Birth: October 21, 1940

## 2015-05-19 ENCOUNTER — Other Ambulatory Visit (INDEPENDENT_AMBULATORY_CARE_PROVIDER_SITE_OTHER): Payer: Medicare Other

## 2015-05-19 ENCOUNTER — Ambulatory Visit: Payer: Medicare Other

## 2015-05-19 DIAGNOSIS — I10 Essential (primary) hypertension: Secondary | ICD-10-CM | POA: Diagnosis not present

## 2015-05-19 DIAGNOSIS — E7800 Pure hypercholesterolemia, unspecified: Secondary | ICD-10-CM

## 2015-05-19 DIAGNOSIS — E1169 Type 2 diabetes mellitus with other specified complication: Secondary | ICD-10-CM

## 2015-05-19 DIAGNOSIS — E119 Type 2 diabetes mellitus without complications: Secondary | ICD-10-CM

## 2015-05-19 DIAGNOSIS — E559 Vitamin D deficiency, unspecified: Secondary | ICD-10-CM | POA: Diagnosis not present

## 2015-05-19 DIAGNOSIS — E669 Obesity, unspecified: Secondary | ICD-10-CM | POA: Diagnosis not present

## 2015-05-19 LAB — HEMOGLOBIN A1C: Hgb A1c MFr Bld: 6.4 % (ref 4.6–6.5)

## 2015-05-19 LAB — CBC
HCT: 37.8 % (ref 36.0–46.0)
Hemoglobin: 12.9 g/dL (ref 12.0–15.0)
MCHC: 34.3 g/dL (ref 30.0–36.0)
MCV: 86.3 fl (ref 78.0–100.0)
PLATELETS: 209 10*3/uL (ref 150.0–400.0)
RBC: 4.38 Mil/uL (ref 3.87–5.11)
RDW: 12.7 % (ref 11.5–15.5)
WBC: 7.1 10*3/uL (ref 4.0–10.5)

## 2015-05-19 LAB — LIPID PANEL
CHOL/HDL RATIO: 3
Cholesterol: 203 mg/dL — ABNORMAL HIGH (ref 0–200)
HDL: 64.5 mg/dL (ref >39.00)
LDL CALC: 109 mg/dL — AB (ref 0–99)
NonHDL: 138.01
Triglycerides: 143 mg/dL (ref 0.0–149.0)
VLDL: 28.6 mg/dL (ref 0.0–40.0)

## 2015-05-19 LAB — COMPREHENSIVE METABOLIC PANEL
ALT: 27 U/L (ref 0–35)
AST: 21 U/L (ref 0–37)
Albumin: 4.2 g/dL (ref 3.5–5.2)
Alkaline Phosphatase: 53 U/L (ref 39–117)
BILIRUBIN TOTAL: 0.7 mg/dL (ref 0.2–1.2)
BUN: 12 mg/dL (ref 6–23)
CALCIUM: 10 mg/dL (ref 8.4–10.5)
CHLORIDE: 100 meq/L (ref 96–112)
CO2: 28 meq/L (ref 19–32)
CREATININE: 0.65 mg/dL (ref 0.40–1.20)
GFR: 94.67 mL/min (ref >60.00)
Glucose, Bld: 121 mg/dL — ABNORMAL HIGH (ref 70–99)
Potassium: 3.9 mEq/L (ref 3.5–5.1)
Sodium: 136 mEq/L (ref 135–145)
Total Protein: 7.2 g/dL (ref 6.0–8.3)

## 2015-05-19 LAB — TSH: TSH: 1.24 u[IU]/mL (ref 0.35–4.50)

## 2015-05-19 LAB — VITAMIN D 25 HYDROXY (VIT D DEFICIENCY, FRACTURES): VITD: 56.22 ng/mL (ref 30.00–100.00)

## 2015-05-25 ENCOUNTER — Ambulatory Visit (INDEPENDENT_AMBULATORY_CARE_PROVIDER_SITE_OTHER): Payer: Medicare Other | Admitting: Family Medicine

## 2015-05-25 ENCOUNTER — Other Ambulatory Visit: Payer: Self-pay | Admitting: Physician Assistant

## 2015-05-25 ENCOUNTER — Encounter: Payer: Self-pay | Admitting: Family Medicine

## 2015-05-25 VITALS — BP 124/72 | HR 69 | Temp 98.3°F | Ht 61.0 in | Wt 177.0 lb

## 2015-05-25 DIAGNOSIS — E669 Obesity, unspecified: Secondary | ICD-10-CM

## 2015-05-25 DIAGNOSIS — M542 Cervicalgia: Secondary | ICD-10-CM

## 2015-05-25 DIAGNOSIS — I700 Atherosclerosis of aorta: Secondary | ICD-10-CM

## 2015-05-25 DIAGNOSIS — R1010 Upper abdominal pain, unspecified: Secondary | ICD-10-CM | POA: Diagnosis not present

## 2015-05-25 DIAGNOSIS — I471 Supraventricular tachycardia, unspecified: Secondary | ICD-10-CM

## 2015-05-25 DIAGNOSIS — E119 Type 2 diabetes mellitus without complications: Secondary | ICD-10-CM

## 2015-05-25 DIAGNOSIS — E559 Vitamin D deficiency, unspecified: Secondary | ICD-10-CM

## 2015-05-25 DIAGNOSIS — E1169 Type 2 diabetes mellitus with other specified complication: Secondary | ICD-10-CM

## 2015-05-25 DIAGNOSIS — E782 Mixed hyperlipidemia: Secondary | ICD-10-CM

## 2015-05-25 DIAGNOSIS — R1013 Epigastric pain: Secondary | ICD-10-CM

## 2015-05-25 DIAGNOSIS — D509 Iron deficiency anemia, unspecified: Secondary | ICD-10-CM

## 2015-05-25 DIAGNOSIS — K219 Gastro-esophageal reflux disease without esophagitis: Secondary | ICD-10-CM

## 2015-05-25 DIAGNOSIS — I10 Essential (primary) hypertension: Secondary | ICD-10-CM

## 2015-05-25 DIAGNOSIS — D229 Melanocytic nevi, unspecified: Secondary | ICD-10-CM

## 2015-05-25 DIAGNOSIS — E1142 Type 2 diabetes mellitus with diabetic polyneuropathy: Secondary | ICD-10-CM

## 2015-05-25 DIAGNOSIS — E7800 Pure hypercholesterolemia, unspecified: Secondary | ICD-10-CM

## 2015-05-25 MED ORDER — ROSUVASTATIN CALCIUM 10 MG PO TABS: 5.0000 mg | ORAL_TABLET | Freq: Every day | ORAL | Status: DC

## 2015-05-25 MED ORDER — ALPRAZOLAM 0.25 MG PO TABS: 0.2500 mg | ORAL_TABLET | Freq: Two times a day (BID) | ORAL | Status: DC | PRN

## 2015-05-25 NOTE — Progress Notes (Signed)
Subjective:    [name], female    DOB: 1940-04-03, 75 y.o.   MRN: [id number]  Chief Complaint  Patient presents with  . Follow-up    HPI Patient is in today for follow up. She is noting persistent abdominal pain most notably in epigastrium. No nausea, vomiting, fevers, chills. She struggles with anhedonia but denies suicidal ideation. Has also noted b/l hip pain and leg pain. No falls or trauma. Denies CP/palp/SOB/HA/congestion/fevers/GI or GU c/o. Taking meds as prescribed  Past Medical History  Diagnosis Date  . Gastroparesis   . Pure hypercholesterolemia   . Headache(784.0)   . Unspecified essential hypertension   . Esophageal reflux   . Type II or unspecified type diabetes mellitus without mention of complication, not stated as uncontrolled   . Arthritis     Spinal Osteoarthritis  . Diverticulosis 08/30/2000    Colonoscopy   . Gastric polyp     Fundic Gland  . Anxiety   . Cataract   . Heart murmur     Echocardiogram 2/11: EF 60-65%, mild LAE, grade 1 diastolic dysfunction, aortic valve sclerosis, mean gradient 9 mm of mercury, PASP 34  . Chest pain     Myoview 12/15 no ischemia.  . Carcinoid tumor of stomach   . PSVT (paroxysmal supraventricular tachycardia) ([name])   . Leg swelling     bilateral  . Chronic kidney disease     Left kidney smaller than right kidney  . Cancer ([name])   . PONV (postoperative nausea and vomiting)     pt states only needs small amount of anesthesia  . Stroke ([name])     tia, 2014  . Iron deficiency anemia, unspecified   . Encounter for preventative adult health care exam with abnormal findings 09/14/2013  . Unspecified hereditary and idiopathic peripheral neuropathy 10/29/2013  . Diabetic peripheral neuropathy ([name]) 10/29/2013  . Diabetes mellitus type 2 in obese ([name]) 09/05/2006    Qualifier: Diagnosis of  By: Marca Ancona RMA, Lucy    . Anemia 06/08/2014  . 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 Neck pain  04/22/2015    Past Surgical History  Procedure Laterality Date  . Cholecystectomy  1993  . Tonsillectomy    . Colonoscopy  11/11/2010    diverticulosis  . Esophagogastroduodenoscopy  08/29/2010; 09/15/2010    Carcinoid tumor less than 1 cm in July 2012 not seen in August 2012 , gastritis, fundic gland polyps  . Eus  12/15/2010    Procedure: UPPER ENDOSCOPIC ULTRASOUND (EUS) LINEAR;  Surgeon: Owens Loffler, MD;  [facility];  Service: Endoscopy;  Laterality: N/A;  . Esophagogastroduodenoscopy  05/16/2011  . Esophagogastroduodenoscopy  06/14/2012  . Dilatation & currettage/hysteroscopy with resectocope N/A 02/25/2013    Procedure: Attempted hysteroscopy with uterine perforation;  Surgeon: Jamey Reas de Berton Lan, MD;  Location: [facility];  Service: Gynecology;  Laterality: N/A;  . Laparoscopy N/A 02/25/2013    Procedure: Cystoscopy and laparoscopy with fulguration of uterine serosa;  Surgeon: Jamey Reas de Berton Lan, MD;  Location: [facility];  Service: Gynecology;  Laterality: N/A;  . Eye surgery Bilateral     Bi lateral cateracts and bi lateral laser    Family History  Problem Relation Age of Onset  . Diabetes Mother   . Stroke Father     deceased age 80  . Heart disease Sister     deceased MI age 66  . Heart disease Brother  deceased MI age 53  . Colon cancer Neg Hx   . Esophageal cancer Neg Hx   . Stomach cancer Neg Hx   . Rectal cancer Neg Hx   . Diabetes Maternal Grandmother   . Hypertension Paternal Grandmother   . Diabetes Sister   . Heart disease Sister   . Hypertension Sister   . Hyperlipidemia Sister   . Diabetes Sister   . Heart disease Sister   . Hypertension Sister   . Hyperlipidemia Sister   . Diabetes Brother   . Heart disease Brother   . Hypertension Brother   . Hyperlipidemia Brother     Social History   Social History  . Marital Status: Married    Spouse Name: N/A  . Number of Children: 0  . Years of Education: college    Occupational History  . Retail    Social History Main Topics  . Smoking status: Never Smoker   . Smokeless tobacco: Never Used     Comment: Never used tobacco  . Alcohol Use: No  . Drug Use: No  . Sexual Activity: No     Comment: lives alone, no dietary restrictions except avoid fresh veg, fruit, whole grains   Other Topics Concern  . Not on file   Social History Narrative   Patient was married ([name]) - widow   Patient does not have any children.   Patient is right-handed.   Patient has a BA degree.   One caffeine drink daily     Outpatient Prescriptions Prior to Visit  Medication Sig Dispense Refill  . acetaminophen (TYLENOL) 500 MG tablet Take 2 tablets (1,000 mg total) by mouth every 6 (six) hours as needed for moderate pain. 30 tablet 0  . amLODipine (NORVASC) 5 MG tablet Take 1 tablet by mouth  daily 90 tablet 1  . aspirin 81 MG tablet Take 81 mg by mouth daily.    . calcium-vitamin D (OSCAL WITH D) 500-200 MG-UNIT per tablet Take 1 tablet by mouth.    . cholecalciferol (VITAMIN D) 1000 UNITS tablet Take 1,000 Units by mouth daily.      Marland Kitchen dicyclomine (BENTYL) 10 MG capsule Take 1 capsule (10 mg total) by mouth 4 (four) times daily as needed. 120 capsule 6  . DIOVAN 320 MG tablet Take 1 tablet by mouth  daily 90 tablet 1  . esomeprazole (NEXIUM) 40 MG capsule Take 1 capsule (40 mg total) by mouth daily before breakfast. 30 capsule 11  . FOLBIC 2.5-25-2 MG TABS tablet TAKE 1 TABLET BY MOUTH EVERY DAY 90 tablet 3  . glucose blood test strip Take once daily and as needed.  DX E11.9 100 each 3  . Lancets MISC Use as instructed once daily to check blood sugar. DX E11.9 (Patient taking differently: Use as instructed twice daily to check blood sugar. DX E11.9) 200 each 2  . metFORMIN (GLUCOPHAGE-XR) 500 MG 24 hr tablet Take 1 tablet by mouth 3  times daily with meals 270 tablet 1  . metoCLOPramide (REGLAN) 5 MG tablet Take 1 tablet by mouth two  times daily 180 tablet 1  .  metoprolol (LOPRESSOR) 50 MG tablet Take 1 tablet by mouth two  times daily 180 tablet 1  . Multiple Vitamin (MULTIVITAMIN) tablet Take 1 tablet by mouth daily.      . ondansetron (ZOFRAN) 4 MG tablet Take 4 mg by mouth every 4 (four) hours as needed. For nausea      . potassium chloride (K-DUR,KLOR-CON) 10  MEQ tablet Take 1 tablet by mouth  daily 60 tablet 2  . vitamin E 400 UNIT capsule Take 400 Units by mouth daily.    Marland Kitchen zoster vaccine live, PF, (ZOSTAVAX) 60454 UNT/0.65ML injection Inject 19,400 Units into the skin once. 1 each 0  . rosuvastatin (CRESTOR) 10 MG tablet Take 1 tablet (10 mg total) by mouth daily. 90 tablet 1   No facility-administered medications prior to visit.    Allergies  Allergen Reactions  . Tramadol     Dizziness     Review of Systems  Constitutional: Negative for fever and malaise/fatigue.  HENT: Negative for congestion.   Eyes: Negative for blurred vision.  Respiratory: Negative for shortness of breath.   Cardiovascular: Negative for chest pain, palpitations and leg swelling.  Gastrointestinal: Positive for abdominal pain. Negative for nausea and blood in stool.  Genitourinary: Negative for dysuria and frequency.  Musculoskeletal: Positive for back pain and joint pain. Negative for falls.  Skin: Negative for rash.  Neurological: Negative for dizziness, loss of consciousness and headaches.  Endo/Heme/Allergies: Negative for environmental allergies.  Psychiatric/Behavioral: Positive for depression. The patient is nervous/anxious.        Objective:    Physical Exam  Constitutional: She is oriented to person, place, and time. She appears well-developed and well-nourished. No distress.  HENT:  Head: Normocephalic and atraumatic.  Nose: Nose normal.  Eyes: Right eye exhibits no discharge. Left eye exhibits no discharge.  Neck: Normal range of motion. Neck supple.  Cardiovascular: Normal rate and regular rhythm.   Pulmonary/Chest: Effort normal and  breath sounds normal.  Abdominal: Soft. Bowel sounds are normal. There is no tenderness.  Musculoskeletal: She exhibits no edema.  Neurological: She is alert and oriented to person, place, and time.  Skin: Skin is warm and dry.  Psychiatric: She has a normal mood and affect.  Nursing note and vitals reviewed.   BP 124/72 mmHg  Pulse 69  Temp(Src) 98.3 F (36.8 C) (Oral)  Ht 5\' 1"  (1.549 m)  Wt 177 lb (80.287 kg)  BMI 33.46 kg/m2  SpO2 96%  LMP 02/07/1992 Wt Readings from Last 3 Encounters:  05/25/15 177 lb (80.287 kg)  05/14/15 173 lb (78.472 kg)  04/22/15 174 lb 2 oz (78.983 kg)     Lab Results  Component Value Date   WBC 7.1 05/19/2015   HGB 12.9 05/19/2015   HCT 37.8 05/19/2015   PLT 209.0 05/19/2015   GLUCOSE 121* 05/19/2015   CHOL 203* 05/19/2015   TRIG 143.0 05/19/2015   HDL 64.50 05/19/2015   LDLDIRECT 123.7 04/13/2011   LDLCALC 109* 05/19/2015   ALT 27 05/19/2015   AST 21 05/19/2015   NA 136 05/19/2015   K 3.9 05/19/2015   CL 100 05/19/2015   CREATININE 0.65 05/19/2015   BUN 12 05/19/2015   CO2 28 05/19/2015   TSH 1.24 05/19/2015   INR 0.88 01/16/2011   HGBA1C 6.4 05/19/2015   MICROALBUR 1.6 11/13/2014    Lab Results  Component Value Date   TSH 1.24 05/19/2015   Lab Results  Component Value Date   WBC 7.1 05/19/2015   HGB 12.9 05/19/2015   HCT 37.8 05/19/2015   MCV 86.3 05/19/2015   PLT 209.0 05/19/2015   Lab Results  Component Value Date   NA 136 05/19/2015   K 3.9 05/19/2015   CO2 28 05/19/2015   GLUCOSE 121* 05/19/2015   BUN 12 05/19/2015   CREATININE 0.65 05/19/2015   BILITOT 0.7 05/19/2015   ALKPHOS  53 05/19/2015   AST 21 05/19/2015   ALT 27 05/19/2015   PROT 7.2 05/19/2015   ALBUMIN 4.2 05/19/2015   CALCIUM 10.0 05/19/2015   ANIONGAP 16* 11/13/2013   GFR 94.67 05/19/2015   Lab Results  Component Value Date   CHOL 203* 05/19/2015   Lab Results  Component Value Date   HDL 64.50 05/19/2015   Lab Results  Component  Value Date   LDLCALC 109* 05/19/2015   Lab Results  Component Value Date   TRIG 143.0 05/19/2015   Lab Results  Component Value Date   CHOLHDL 3 05/19/2015   Lab Results  Component Value Date   HGBA1C 6.4 05/19/2015       Assessment & Plan:   Problem List Items Addressed This Visit    Diabetes mellitus type 2 in obese ([place])    hgba1c acceptable, minimize simple carbs. Increase exercise as tolerated. Continue current meds      Relevant Medications   rosuvastatin (CRESTOR) 10 MG tablet   Other Relevant Orders   US Aorta (Completed)   CBC   Comprehensive metabolic panel   TSH   Lipid panel   Vitamin D 1,25 dihydroxy   Hemoglobin A1c   Diabetic peripheral neuropathy (HCC)   Relevant Medications   rosuvastatin (CRESTOR) 10 MG tablet   ALPRAZolam (XANAX) 0.25 MG tablet   Other Relevant Orders   US Aorta (Completed)   CBC   Comprehensive metabolic panel   TSH   Lipid panel   Vitamin D 1,25 dihydroxy   Hemoglobin A1c   EPIGASTRIC PAIN chronic   Relevant Orders   US Aorta (Completed)   CBC   Comprehensive metabolic panel   TSH   Lipid panel   Vitamin D 1,25 dihydroxy   Hemoglobin A1c   Essential hypertension    Some ankle edema will have her try to change Amlodipine 5 mg to 1/2 tab po bid and maintain other meds, BP well controlled today      Relevant Medications   rosuvastatin (CRESTOR) 10 MG tablet   Other Relevant Orders   US Aorta (Completed)   CBC   Comprehensive metabolic panel   TSH   Lipid panel   Vitamin D 1,25 dihydroxy   Hemoglobin A1c   GERD (gastroesophageal reflux disease)    Avoid offending foods, start probiotics. Do not eat large meals in late evening and consider raising head of bed.       Relevant Orders   US Aorta (Completed)   CBC   Comprehensive metabolic panel   TSH   Lipid panel   Vitamin D 1,25 dihydroxy   Hemoglobin A1c   HYPERCHOLESTEROLEMIA   Relevant Medications   rosuvastatin (CRESTOR) 10 MG tablet   Other  Relevant Orders   CBC   Comprehensive metabolic panel   TSH   Lipid panel   Vitamin D 1,25 dihydroxy   Hemoglobin A1c   Iron deficiency anemia   Relevant Orders   US Aorta (Completed)   CBC   Comprehensive metabolic panel   TSH   Lipid panel   Vitamin D 1,25 dihydroxy   Hemoglobin A1c   Neck pain - Primary   Relevant Orders   Ambulatory referral to Physical Therapy   US Aorta (Completed)   CBC   Comprehensive metabolic panel   TSH   Lipid panel   Vitamin D 1,25 dihydroxy   Hemoglobin A1c   Pain of upper abdomen    Aortic Ultrasound only shows atherosclerosis. Patient to report  any further or worsening symptoms. Avoid offending foods      Relevant Orders   US Aorta (Completed)   CBC   Comprehensive metabolic panel   TSH   Lipid panel   Vitamin D 1,25 dihydroxy   Hemoglobin A1c   Paroxysmal supraventricular tachycardia (HCC)   Relevant Medications   rosuvastatin (CRESTOR) 10 MG tablet   Other Relevant Orders   US Aorta (Completed)   CBC   Comprehensive metabolic panel   TSH   Lipid panel   Vitamin D 1,25 dihydroxy   Hemoglobin A1c   Vitamin D deficiency    Continue supplementation      Relevant Orders   CBC   Comprehensive metabolic panel   TSH   Lipid panel   Vitamin D 1,25 dihydroxy   Hemoglobin A1c    Other Visit Diagnoses    Aortic atherosclerosis (HCC)        Relevant Medications    rosuvastatin (CRESTOR) 10 MG tablet    Other Relevant Orders    US Aorta (Completed)    CBC    Comprehensive metabolic panel    TSH    Lipid panel    Vitamin D 1,25 dihydroxy    Hemoglobin A1c    Hyperlipidemia, mixed        Relevant Medications    rosuvastatin (CRESTOR) 10 MG tablet    Other Relevant Orders    US Aorta (Completed)    CBC    Comprehensive metabolic panel    TSH    Lipid panel    Vitamin D 1,25 dihydroxy    Hemoglobin A1c       I have changed Ms. Arca's rosuvastatin. I am also having her start on ALPRAZolam. Additionally, I am  having her maintain her multivitamin, cholecalciferol, ondansetron, acetaminophen, aspirin, calcium-vitamin D, potassium chloride, vitamin E, Lancets, zoster vaccine live (PF), esomeprazole, dicyclomine, amLODipine, glucose blood, metoprolol, DIOVAN, metoCLOPramide, FOLBIC, and metFORMIN.  Meds ordered this encounter  Medications  . rosuvastatin (CRESTOR) 10 MG tablet    Sig: Take 0.5 tablets (5 mg total) by mouth daily.    Dispense:  45 tablet    Refill:  1    [id number] Exp: 09/2016  . ALPRAZolam (XANAX) 0.25 MG tablet    Sig: Take 1 tablet (0.25 mg total) by mouth 2 (two) times daily as needed for anxiety.    Dispense:  30 tablet    Refill:  1     Penni Homans, MD

## 2015-05-25 NOTE — Patient Instructions (Addendum)
Drop Crestor to 1/2 tab po daily with Co Q 10 caps daily NOW Probiotic Vitamin. Available at [employer].    Cholesterol Cholesterol is a white, waxy, fat-like substance needed by your body in small amounts. The liver makes all the cholesterol you need. Cholesterol is carried from the liver by the blood through the blood vessels. Deposits of cholesterol (plaque) may build up on blood vessel walls. These make the arteries narrower and stiffer. Cholesterol plaques increase the risk for heart attack and stroke.  You cannot feel your cholesterol level even if it is very high. The only way to know it is high is with a blood test. Once you know your cholesterol levels, you should keep a record of the test results. Work with your health care provider to keep your levels in the desired range.  WHAT DO THE RESULTS MEAN?  Total cholesterol is a rough measure of all the cholesterol in your blood.   LDL is the so-called bad cholesterol. This is the type that deposits cholesterol in the walls of the arteries. You want this level to be low.   HDL is the good cholesterol because it cleans the arteries and carries the LDL away. You want this level to be high.  Triglycerides are fat that the body can either burn for energy or store. High levels are closely linked to heart disease.  WHAT ARE THE DESIRED LEVELS OF CHOLESTEROL?  Total cholesterol below 200.   LDL below 100 for people at risk, below 70 for those at very high risk.   HDL above 50 is good, above 60 is best.   Triglycerides below 150.  HOW CAN I LOWER MY CHOLESTEROL?  Diet. Follow your diet programs as directed by your health care provider.   Choose fish or white meat chicken and [place], roasted or baked. Limit fatty cuts of red meat, fried foods, and processed meats, such as sausage and lunch meats.   Eat lots of fresh fruits and vegetables.  Choose whole grains, beans, pasta, potatoes, and cereals.   Use only small  amounts of olive, corn, or canola oils.   Avoid butter, mayonnaise, shortening, or palm kernel oils.  Avoid foods with trans fats.   Drink skim or nonfat milk and eat low-fat or nonfat yogurt and cheeses. Avoid whole milk, cream, ice cream, egg yolks, and full-fat cheeses.   Healthy desserts include angel food cake, ginger snaps, animal crackers, hard candy, popsicles, and low-fat or nonfat frozen yogurt. Avoid pastries, cakes, pies, and cookies.   Exercise. Follow your exercise programs as directed by your health care provider.   A regular program helps decrease LDL and raise HDL.   A regular program helps with weight control.   Do things that increase your activity level like gardening, walking, or taking the stairs. Ask your health care provider about how you can be more active in your daily life.   Medicine. Take medicine only as directed by your health care provider.   Medicine may be prescribed by your health care provider to help lower cholesterol and decrease the risk for heart disease.   If you have several risk factors, you may need medicine even if your levels are normal.   This information is not intended to replace advice given to you by your health care provider. Make sure you discuss any questions you have with your health care provider.   Document Released: 10/18/2000 Document Revised: 02/13/2014 Document Reviewed: 11/06/2012 Elsevier Interactive Patient Education [employer].

## 2015-05-25 NOTE — Assessment & Plan Note (Signed)
Some ankle edema will have her try to change Amlodipine 5 mg to 1/2 tab po bid and maintain other meds, BP well controlled today

## 2015-05-25 NOTE — Progress Notes (Signed)
Pre visit review using our clinic review tool, if applicable. No additional management support is needed unless otherwise documented below in the visit note. 

## 2015-05-26 ENCOUNTER — Ambulatory Visit: Payer: Medicare Other | Admitting: Physical Therapy

## 2015-05-26 DIAGNOSIS — R262 Difficulty in walking, not elsewhere classified: Secondary | ICD-10-CM | POA: Diagnosis not present

## 2015-05-26 DIAGNOSIS — M542 Cervicalgia: Secondary | ICD-10-CM | POA: Diagnosis not present

## 2015-05-26 DIAGNOSIS — M5441 Lumbago with sciatica, right side: Secondary | ICD-10-CM | POA: Diagnosis not present

## 2015-05-26 NOTE — Therapy (Signed)
[facility] [address]  [place], [place], [address] Phone: [phone]   Fax:  [phone]  Physical Therapy Treatment  Patient Details  Name: [name] MRN: [id number] Date of Birth: 01/31/1941 Referring Provider: [name], MD  Encounter Date: 05/26/2015      PT End of Session - 05/26/15 1120    Visit Number 3   Number of Visits 16   Date for PT Re-Evaluation 07/07/15   PT Start Time 1108  Pt arrived late   PT Stop Time 1157   PT Time Calculation (min) 49 min   Activity Tolerance Patient tolerated treatment well;Patient limited by pain   Behavior During Therapy [facility] for tasks assessed/performed      Past Medical History  Diagnosis Date  . Gastroparesis   . Pure hypercholesterolemia   . Headache(784.0)   . Unspecified essential hypertension   . Esophageal reflux   . Type II or unspecified type diabetes mellitus without mention of complication, not stated as uncontrolled   . Arthritis     Spinal Osteoarthritis  . Diverticulosis 08/30/2000    Colonoscopy   . Gastric polyp     Fundic Gland  . Anxiety   . Cataract   . Heart murmur     Echocardiogram 2/11: EF 60-65%, mild LAE, grade 1 diastolic dysfunction, aortic valve sclerosis, mean gradient 9 mm of mercury, PASP 34  . Chest pain     Myoview 12/15 no ischemia.  . Carcinoid tumor of stomach   . PSVT (paroxysmal supraventricular tachycardia) ([place])   . Leg swelling     bilateral  . Chronic kidney disease     Left kidney smaller than right kidney  . Cancer ([place])   . PONV (postoperative nausea and vomiting)     pt states only needs small amount of anesthesia  . Stroke ([place])     tia, 2014  . Iron deficiency anemia, unspecified   . Encounter for preventative adult health care exam with abnormal findings 09/14/2013  . Unspecified hereditary and idiopathic peripheral neuropathy 10/29/2013  . Diabetic peripheral neuropathy ([place]) 10/29/2013   . Diabetes mellitus type 2 in obese ([place]) 09/05/2006    Qualifier: Diagnosis of  By: Marca Ancona RMA, Lucy    . Anemia 06/08/2014  . 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 Neck pain 04/22/2015    Past Surgical History  Procedure Laterality Date  . Cholecystectomy  1993  . Tonsillectomy    . Colonoscopy  11/11/2010    diverticulosis  . Esophagogastroduodenoscopy  08/29/2010; 09/15/2010    Carcinoid tumor less than 1 cm in July 2012 not seen in August 2012 , gastritis, fundic gland polyps  . Eus  12/15/2010    Procedure: UPPER ENDOSCOPIC ULTRASOUND (EUS) LINEAR;  Surgeon: Owens Loffler, MD;  Location: [facility];  Service: Endoscopy;  Laterality: N/A;  . Esophagogastroduodenoscopy  05/16/2011  . Esophagogastroduodenoscopy  06/14/2012  . Dilatation & currettage/hysteroscopy with resectocope N/A 02/25/2013    Procedure: Attempted hysteroscopy with uterine perforation;  Surgeon: Jamey Reas de Berton Lan, MD;  Location: [facility];  Service: Gynecology;  Laterality: N/A;  . Laparoscopy N/A 02/25/2013    Procedure: Cystoscopy and laparoscopy with fulguration of uterine serosa;  Surgeon: Jamey Reas de Berton Lan, MD;  Location: [facility];  Service: Gynecology;  Laterality: N/A;  . Eye surgery Bilateral     Bi lateral cateracts and bi lateral laser  There were no vitals filed for this visit.      Subjective Assessment - 05/26/15 1113    Subjective Pt states she told her PCP MD about her neck bothering her again and referral received to add neck to current POC. Reports increased pain in hips after last visit making walking difficult for 2 days - pt attributes this to manual traction.   Currently in Pain? Yes   Pain Score 8    Pain Location Back   Pain Orientation Lower   Pain Descriptors / Indicators Sharp   Multiple Pain Sites Yes   Pain Score 8  Least 0/10, Avg 8/10, Worst 10/10   Pain Location Neck   Pain Orientation Mid;Lateral;Left   Pain Descriptors /  Indicators Discomfort   Pain Type Acute pain;Chronic pain  Acute on chronic   Pain Onset More than a month ago  3-4 mos ago   Pain Frequency Intermittent   Aggravating Factors  Reading   Pain Relieving Factors Previous HEP exercises   Effect of Pain on Daily Activities Limits ability to sleep           Today's Treatment  Manual  B Hamstring, SKTC & piriformis stretches 2x30"  TherEx Hooklying Abdominal bracing 10x3" Hooklying Pelvic tilt 10x3" TrA + Hooklying Hip ABD/ER fall-out x10 TrA + Hooklying March x10 LTR 10x5" DKTC with feet on peanut ball 2x10x3" Bridge with feet on peanut ball 10x3"  Modalities Moist heat pack to R Low back & Hip in L sidelying x10'           PT Short Term Goals - 05/26/15 1355    PT SHORT TERM GOAL #1   Title Independent with initial HEP by 06/09/15   Status On-going           PT Long Term Goals - 05/26/15 1355    PT LONG TERM GOAL #1   Title Independent with advanced HEP as indicated by 07/07/15   Status On-going   PT LONG TERM GOAL #2   Title Pt will demonstrate lumbar ROM 50% of normal or greater w/o increased pain by 07/07/15   Status On-going   PT LONG TERM GOAL #3   Title Pt will tolerate sitting for 30 minutes or greater without increased low back or leg pain while watching TV or working on computer by 07/07/15   Status On-going   PT LONG TERM GOAL #4   Title Pt will report ability to perform normal ADL's and light household chores without increased low back or leg pain by 07/07/15   Status On-going               Plan - 05/26/15 1207    Clinical Impression Statement Pt noting increased pain in hips after last session which she attributes to manual traction attempted at last visit, therefore traction (manual or mechanical) not attemtped today. Pt only tolerating limited progression of reps with exercises today secondary to limited compliance with HEP (ony attempted 1-2 times since last PT visit a week ago).  Discussed new referral for neck and reviewed postural education from last therapy episode as first part of reducing neck pain. Pt reports she is still completing neck HEP but question compliance with frequency given admission of limited compliance with LBP HEP. DIscussed addition of neck focus to POC and potential overlap of some exercises/activites in benefitting both areas.   PT Treatment/Interventions Patient/family education;Manual techniques;Passive range of motion;Taping;Therapeutic exercise;Therapeutic activities;Functional mobility training;Gait training;Neuromuscular re-education;Moist Heat;Electrical Stimulation;Cryotherapy;Traction   PT Next  Visit Plan Assess neck baseline and establish goals as indicated; Manual therapy and modalities PRN to manage pain, Progress lumbar flexibility and stabilization exercises as tolerated and update HEP as appropriate, ?Mechanical traction - Static L-spine, Hooklying supine with neutral pull, Low load (30-40#)   Consulted and Agree with Plan of Care Patient      Patient will benefit from skilled therapeutic intervention in order to improve the following deficits and impairments:  Pain, Impaired flexibility, Decreased range of motion, Increased muscle spasms, Decreased strength, Difficulty walking, Decreased activity tolerance, Postural dysfunction, Impaired perceived functional ability, Improper body mechanics  Visit Diagnosis: Bilateral low back pain with right-sided sciatica  Difficulty in walking, not elsewhere classified     Problem List Patient Active Problem List   Diagnosis Date Noted  . Neck pain 04/22/2015  . Side pain 04/05/2015  . Ear pain 02/28/2015  . Abnormal urine 11/21/2014  . 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 Anemia 06/08/2014  . RLS (restless legs syndrome) 11/23/2013  . Elevated troponin 11/13/2013  . Diabetic peripheral neuropathy ([place]) 10/29/2013  . Left-sided thoracic back pain 10/06/2013  .  Encounter for preventative adult health care exam with abnormal findings 09/14/2013  . Iron deficiency anemia   . Status post laparoscopy 02/25/2013  . GERD (gastroesophageal reflux disease) 01/09/2013  . Amaurosis fugax of left eye 10/16/2012  . Low back pain 06/03/2012  . Vitamin D deficiency 03/11/2012  . Bilateral hand pain 10/20/2011  . Encounter for long-term (current) use of other medications 10/20/2011  . IBS (irritable bowel syndrome) 08/14/2011  . TIA (transient ischemic attack) 02/10/2011  . Abnormal brain MRI 01/19/2011  . Allergic rhinitis 10/01/2010  . 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 FUNDIC GLAND POLYPS OF STOMACH 03/18/2010  . EPIGASTRIC PAIN chronic 03/18/2010  . ARTHRALGIA 03/17/2009  . SYSTOLIC MURMUR [id number]  . Paroxysmal supraventricular tachycardia ([place]) 01/12/2009  . PLANTAR FASCIITIS 06/08/2008  . CHEST PAIN 05/18/2008  . Gastroparesis 12/18/2007  . HYPERCHOLESTEROLEMIA 06/11/2007  . Diabetes mellitus type 2 in obese ([place]) 09/05/2006  . Essential hypertension 09/05/2006    Percival Spanish, PT, MPT 05/26/2015, 1:57 PM  [facility] [address]  [place], [place], [address] Phone: [phone]   Fax:  [phone]  Name: TYEASHA KUENY MRN: [id number] Date of Birth: 05-16-1940

## 2015-05-28 ENCOUNTER — Ambulatory Visit: Payer: Medicare Other | Admitting: Physical Therapy

## 2015-05-28 DIAGNOSIS — M542 Cervicalgia: Secondary | ICD-10-CM

## 2015-05-28 DIAGNOSIS — M5441 Lumbago with sciatica, right side: Secondary | ICD-10-CM

## 2015-05-28 DIAGNOSIS — R262 Difficulty in walking, not elsewhere classified: Secondary | ICD-10-CM | POA: Diagnosis not present

## 2015-05-28 NOTE — Therapy (Addendum)
[facility] [address]  [place], [place], [address] Phone: [phone]   Fax:  [phone]  Physical Therapy Treatment  Patient Details  Name: [name] MRN: [id number] Date of Birth: 19-Apr-1940 Referring Provider: [name], MD  Encounter Date: 05/28/2015      PT End of Session - 05/28/15 1103    Visit Number 4   Number of Visits 16   Date for PT Re-Evaluation 07/07/15   PT Start Time [id number]   PT Stop Time 1103   PT Time Calculation (min) 45 min   Activity Tolerance Patient tolerated treatment well   Behavior During Therapy [facility] for tasks assessed/performed      Past Medical History  Diagnosis Date  . Gastroparesis   . Pure hypercholesterolemia   . Headache(784.0)   . Unspecified essential hypertension   . Esophageal reflux   . Type II or unspecified type diabetes mellitus without mention of complication, not stated as uncontrolled   . Arthritis     Spinal Osteoarthritis  . Diverticulosis 08/30/2000    Colonoscopy   . Gastric polyp     Fundic Gland  . Anxiety   . Cataract   . Heart murmur     Echocardiogram 2/11: EF 60-65%, mild LAE, grade 1 diastolic dysfunction, aortic valve sclerosis, mean gradient 9 mm of mercury, PASP 34  . Chest pain     Myoview 12/15 no ischemia.  . Carcinoid tumor of stomach   . PSVT (paroxysmal supraventricular tachycardia) ([place])   . Leg swelling     bilateral  . Chronic kidney disease     Left kidney smaller than right kidney  . Cancer ([place])   . PONV (postoperative nausea and vomiting)     pt states only needs small amount of anesthesia  . Stroke ([place])     tia, 2014  . Iron deficiency anemia, unspecified   . Encounter for preventative adult health care exam with abnormal findings 09/14/2013  . Unspecified hereditary and idiopathic peripheral neuropathy 10/29/2013  . Diabetic peripheral neuropathy ([place]) 10/29/2013  . Diabetes mellitus type 2 in obese ([place])  09/05/2006    Qualifier: Diagnosis of  By: Marca Ancona RMA, Lucy    . Anemia 06/08/2014  . 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 Neck pain 04/22/2015    Past Surgical History  Procedure Laterality Date  . Cholecystectomy  1993  . Tonsillectomy    . Colonoscopy  11/11/2010    diverticulosis  . Esophagogastroduodenoscopy  08/29/2010; 09/15/2010    Carcinoid tumor less than 1 cm in July 2012 not seen in August 2012 , gastritis, fundic gland polyps  . Eus  12/15/2010    Procedure: UPPER ENDOSCOPIC ULTRASOUND (EUS) LINEAR;  Surgeon: Owens Loffler, MD;  [facility]: [facility];  Service: Endoscopy;  Laterality: N/A;  . Esophagogastroduodenoscopy  05/16/2011  . Esophagogastroduodenoscopy  06/14/2012  . Dilatation & currettage/hysteroscopy with resectocope N/A 02/25/2013    Procedure: Attempted hysteroscopy with uterine perforation;  Surgeon: Jamey Reas de Berton Lan, MD;  Location: [facility];  Service: Gynecology;  Laterality: N/A;  . Laparoscopy N/A 02/25/2013    Procedure: Cystoscopy and laparoscopy with fulguration of uterine serosa;  Surgeon: Jamey Reas de Berton Lan, MD;  Location: [facility];  Service: Gynecology;  Laterality: N/A;  . Eye surgery Bilateral     Bi lateral cateracts and bi lateral laser    There were no vitals filed for this  visit.      Subjective Assessment - 05/28/15 1056    Subjective Pt reporting neck pain more bothersome than LBP today. States she is trying to be more aware of her posture but still finds herself slumping forward when not thinking about it. Admits to limited compliance with HEP - "I'm lazy."   Currently in Pain? Yes   Pain Score 7    Pain Location Back   Pain Orientation Lower   Pain Descriptors / Indicators Sharp   Pain Score 8   Pain Location Neck   Pain Orientation Mid;Lateral;Left;Lower   Pain Descriptors / Indicators Sharp   Pain Type Acute pain;Chronic pain  Acute exacerbation of chronic pain   Pain Onset More than a  month ago  3-4 mos ago   Pain Frequency Intermittent   Aggravating Factors  Reading   Pain Relieving Factors Previous HEP exercises (but admits to limited compliance)   Effect of Pain on Daily Activities Limits ability to sleep            [facility] PT Assessment - 05/28/15 1018    Assessment   Medical Diagnosis Low back pain with sciatica; Neck pain   Onset Date/Surgical Date 04/22/15   Next MD Visit 08/26/15   Prior Therapy PT for neck pain 09/14/2014-11/05/2014   Prior Function   Level of Independence Independent   Vocation Retired   Catering manager, reading, traveling   ROM / Strength   AROM / PROM / Strength AROM;Strength   AROM   Overall AROM Comments B shoulder ROM WFL except slightly limited in flexion   AROM Assessment Site Cervical;Shoulder   Right/Left Shoulder Right;Left   Right Shoulder Flexion 124 Degrees   Left Shoulder Extension 127 Degrees   Cervical Flexion 34   Cervical Extension 33   Cervical - Right Side Bend 20   Cervical - Left Side Bend 18   Cervical - Right Rotation 52   Cervical - Left Rotation 39   Strength   Overall Strength Comments B Shoulder grossly 4/5         Today's Treatment  Cervical spine assessment (Re-Eval)  Postural education  TherEx Seated chin tuck x10 B Seated UT stretch with hand grip on edge of seat 2x20" Doorway Pec stretch 2x20" B Shoulder Rows with red TB 10x3" B Scapular Retraction + Shoulder Extension to neutral with red TB 10x3" Hooklying:   Chin tuck with thin folded pillow under head x10   B Shoulder Horiz ABD + Scap retratction with red TB 10x3"   B Shoulder ER with red TB 10x3"           PT Education - 05/28/15 1241    Education Details Cervical HEP; Pt instructed to complete cervical & lumbar HEP's on alternating days   Person(s) Educated Patient   Methods Explanation;Demonstration;Handout;Tactile cues;Verbal cues   Comprehension Verbalized understanding;Returned demonstration;Verbal cues  required;Tactile cues required;Need further instruction          PT Short Term Goals - 05/26/15 1355    PT SHORT TERM GOAL #1   Title Independent with initial HEP by 06/09/15   Status On-going           PT Long Term Goals - 05/28/15 1306    PT LONG TERM GOAL #1   Title Independent with advanced HEP as indicated by 07/07/15   Status On-going   PT LONG TERM GOAL #2   Title Pt will demonstrate lumbar ROM 50% of normal or greater w/o increased pain by  07/07/15   Status On-going   PT LONG TERM GOAL #3   Title Pt will tolerate sitting for 30 minutes or greater without increased neck, low back or leg pain while watching TV or working on computer by 07/07/15   Status Revised   PT LONG TERM GOAL #4   Title Pt will report ability to perform normal ADL's and light household chores without increased low back or leg pain by 07/07/15   Status On-going   PT LONG TERM GOAL #5   Title Pt will demonstrate increased cervical ROM in all planes by at least 10 degrees (5 degrees for extension) to allow for functional ROM of neck by 07/07/15   Status New   Additional Long Term Goals   Additional Long Term Goals Yes   PT LONG TERM GOAL #6   Title Pt will consistently demonstrate awareness of neutral neck and shoulder posture by 07/07/15    Status New               Plan - 05/28/15 1242    Clinical Impression Statement Cervical assessment completed with pt demonstrating decreased ROM in all planes as compared to D/C from PT last year (10/2014): Cervical Flexion 34 (was 52), Extension 33 (39), Side Bend R 20 (34) / L 18 (34), Rotation R 52 (70) / L 39 (67). B shoulder ROM grossly WFL other slight limitation in flexion (R 124, L 127) and strength grossly 4/5 (equivalent to status at discharge from PT last year). Pt reports neck pain mostly in mid/lower left side of neck with increased ttp and tightness in L upper trap. Provided pt with updated version of prior cervical HEP and instructed pt to alternate  days between cervical & lumbar HEP's. Also emphasized need for good compliance with HEP's as double treatment focus for neck and low back will limit time during therapy visits for each problem.   PT Treatment/Interventions Patient/family education;Manual techniques;Passive range of motion;Taping;Therapeutic exercise;Therapeutic activities;Functional mobility training;Gait training;Neuromuscular re-education;Moist Heat;Electrical Stimulation;Cryotherapy;Traction;ADLs/Self Care Home Management   PT Next Visit Plan Review new cervical HEP; Manual therapy and modalities PRN to manage pain; Postural training; Progress cervical & lumbar flexibility and stabilization exercises as tolerated and update HEPs as appropriate   Consulted and Agree with Plan of Care Patient      Patient will benefit from skilled therapeutic intervention in order to improve the following deficits and impairments:  Pain, Impaired flexibility, Decreased range of motion, Increased muscle spasms, Decreased strength, Difficulty walking, Decreased activity tolerance, Postural dysfunction, Impaired perceived functional ability, Improper body mechanics  Visit Diagnosis: Bilateral low back pain with right-sided sciatica  Difficulty in walking, not elsewhere classified  Cervicalgia     Problem List Patient Active Problem List   Diagnosis Date Noted  . Neck pain 04/22/2015  . Side pain 04/05/2015  . Ear pain 02/28/2015  . Abnormal urine 11/21/2014  . 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 Anemia 06/08/2014  . RLS (restless legs syndrome) 11/23/2013  . Elevated troponin 11/13/2013  . Diabetic peripheral neuropathy ([place]) 10/29/2013  . Left-sided thoracic back pain 10/06/2013  . Encounter for preventative adult health care exam with abnormal findings 09/14/2013  . Iron deficiency anemia   . Status post laparoscopy 02/25/2013  . GERD (gastroesophageal reflux disease) 01/09/2013  . Amaurosis fugax of left  eye 10/16/2012  . Low back pain 06/03/2012  . Vitamin D deficiency 03/11/2012  . Bilateral hand pain 10/20/2011  . Encounter for long-term (current) use of  other medications 10/20/2011  . IBS (irritable bowel syndrome) 08/14/2011  . TIA (transient ischemic attack) 02/10/2011  . Abnormal brain MRI 01/19/2011  . Allergic rhinitis 10/01/2010  . 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 FUNDIC GLAND POLYPS OF STOMACH 03/18/2010  . EPIGASTRIC PAIN chronic 03/18/2010  . ARTHRALGIA 03/17/2009  . SYSTOLIC MURMUR [id number]  . Paroxysmal supraventricular tachycardia ([place]) 01/12/2009  . PLANTAR FASCIITIS 06/08/2008  . CHEST PAIN 05/18/2008  . Gastroparesis 12/18/2007  . HYPERCHOLESTEROLEMIA 06/11/2007  . Diabetes mellitus type 2 in obese ([place]) 09/05/2006  . Essential hypertension 09/05/2006    Percival Spanish, PT, MPT Jun 23, 2015, 1:30 PM  [facility] [address]  [place], [place], [address] Phone: [phone]   Fax:  [phone]  Name: ALLY HEMPHILL MRN: [id number] Date of Birth: 07-18-1940      G-Codes - Jun 23, 2015 1241   Functional Assessment Tool Used Clinical judgement for addition of cervicalgia diagnosis to POC (LBP will remain primary diagnosis)   Functional Limitation Other Primary PT   Other Primary PT Current Status [phone]) At least 40 percent but less than 60 percent impaired, limited or restricted   Other Primary PT Goal Status [phone]) At least 40 percent but less than 60 percent impaired, limited or restricted   Other Primary PT Discharge Status [phone]) At least 40 percent but less than 60 percent impaired, limited or restricted               Percival Spanish, PT, MPT 07/09/2015, 10:13 AM  [facility] [address]  [place], [place], [address] Phone: [phone]   Fax:  [phone]

## 2015-05-31 ENCOUNTER — Ambulatory Visit: Payer: Medicare Other | Admitting: Physical Therapy

## 2015-05-31 DIAGNOSIS — R262 Difficulty in walking, not elsewhere classified: Secondary | ICD-10-CM | POA: Diagnosis not present

## 2015-05-31 DIAGNOSIS — M542 Cervicalgia: Secondary | ICD-10-CM

## 2015-05-31 DIAGNOSIS — M5441 Lumbago with sciatica, right side: Secondary | ICD-10-CM

## 2015-05-31 NOTE — Therapy (Signed)
[facility] [address]  [place], [place], [address] Phone: [phone]   Fax:  [phone]  Physical Therapy Treatment  Patient Details  Name: [name] MRN: [id number] Date of Birth: 1940-11-29 Referring Provider: [name], MD  Encounter Date: 05/31/2015      PT End of Session - 05/31/15 1325    Visit Number 5   Number of Visits 16   Date for PT Re-Evaluation 07/07/15   PT Start Time [id number]   PT Stop Time 1409   PT Time Calculation (min) 54 min   Activity Tolerance Patient tolerated treatment well   Behavior During Therapy [facility] for tasks assessed/performed      Past Medical History  Diagnosis Date  . Gastroparesis   . Pure hypercholesterolemia   . Headache(784.0)   . Unspecified essential hypertension   . Esophageal reflux   . Type II or unspecified type diabetes mellitus without mention of complication, not stated as uncontrolled   . Arthritis     Spinal Osteoarthritis  . Diverticulosis 08/30/2000    Colonoscopy   . Gastric polyp     Fundic Gland  . Anxiety   . Cataract   . Heart murmur     Echocardiogram 2/11: EF 60-65%, mild LAE, grade 1 diastolic dysfunction, aortic valve sclerosis, mean gradient 9 mm of mercury, PASP 34  . Chest pain     Myoview 12/15 no ischemia.  . Carcinoid tumor of stomach   . PSVT (paroxysmal supraventricular tachycardia) ([place])   . Leg swelling     bilateral  . Chronic kidney disease     Left kidney smaller than right kidney  . Cancer ([place])   . PONV (postoperative nausea and vomiting)     pt states only needs small amount of anesthesia  . Stroke ([place])     tia, 2014  . Iron deficiency anemia, unspecified   . Encounter for preventative adult health care exam with abnormal findings 09/14/2013  . Unspecified hereditary and idiopathic peripheral neuropathy 10/29/2013  . Diabetic peripheral neuropathy ([place]) 10/29/2013  . Diabetes mellitus type 2 in obese ([place])  09/05/2006    Qualifier: Diagnosis of  By: Marca Ancona RMA, Lucy    . Anemia 06/08/2014  . 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 Neck pain 04/22/2015    Past Surgical History  Procedure Laterality Date  . Cholecystectomy  1993  . Tonsillectomy    . Colonoscopy  11/11/2010    diverticulosis  . Esophagogastroduodenoscopy  08/29/2010; 09/15/2010    Carcinoid tumor less than 1 cm in July 2012 not seen in August 2012 , gastritis, fundic gland polyps  . Eus  12/15/2010    Procedure: UPPER ENDOSCOPIC ULTRASOUND (EUS) LINEAR;  Surgeon: Owens Loffler, MD;  Location: [facility];  Service: Endoscopy;  Laterality: N/A;  . Esophagogastroduodenoscopy  05/16/2011  . Esophagogastroduodenoscopy  06/14/2012  . Dilatation & currettage/hysteroscopy with resectocope N/A 02/25/2013    Procedure: Attempted hysteroscopy with uterine perforation;  Surgeon: Jamey Reas de Berton Lan, MD;  Location: [facility];  Service: Gynecology;  Laterality: N/A;  . Laparoscopy N/A 02/25/2013    Procedure: Cystoscopy and laparoscopy with fulguration of uterine serosa;  Surgeon: Jamey Reas de Berton Lan, MD;  Location: [facility];  Service: Gynecology;  Laterality: N/A;  . Eye surgery Bilateral     Bi lateral cateracts and bi lateral laser    There were no vitals filed for this  visit.      Subjective Assessment - 05/31/15 1322    Subjective Pt states low back feels lousy today but neck only "so-so". "Everything (knees, shoulders, back and neck) because of the (rainy) weather".   Currently in Pain? Yes   Pain Score 9    Pain Location Back   Pain Orientation Lower   Pain Descriptors / Indicators Sharp   Pain Score 8   Pain Location Neck   Pain Orientation Left;Lower;Lateral   Pain Descriptors / Indicators Sharp          Today's Treatment  TherEx NuStep - lvl 4 x 5'  Manual  B Hamstring, SKTC, and Figure 4 & KTOS piriformis stretches 2x30"  TherEx LTR 10x5" DKTC with feet on peanut ball  2x10x3" Bridge with feet on peanut ball 10x3" Hooklying Abdominal bracing + Pelvic tilt 10x3" TrA + Dead bug x10 TrA + Hooklying Alternating Hip ABD/ER with green TB x10 TrA + Hooklying March with green TB x10 BATCA Low Row (w/o anterior chest pad) 15# 2x10  Modalities Moist heat pack to R Low back & Hip in L sidelying x10'          PT Short Term Goals - 05/31/15 1401    PT SHORT TERM GOAL #1   Title Independent with initial HEP by 06/09/15   Status On-going           PT Long Term Goals - 05/31/15 1401    PT LONG TERM GOAL #1   Title Independent with advanced HEP as indicated by 07/07/15   Status On-going   PT LONG TERM GOAL #2   Title Pt will demonstrate lumbar ROM 50% of normal or greater w/o increased pain by 07/07/15   Status On-going   PT LONG TERM GOAL #3   Title Pt will tolerate sitting for 30 minutes or greater without increased neck, low back or leg pain while watching TV or working on computer by 07/07/15   Status Revised   PT LONG TERM GOAL #4   Title Pt will report ability to perform normal ADL's and light household chores without increased low back or leg pain by 07/07/15   Status On-going   PT LONG TERM GOAL #5   Title Pt will demonstrate increased cervical ROM in all planes by at least 10 degrees (5 degrees for extension) to allow for functional ROM of neck by 07/07/15   Status On-going   PT LONG TERM GOAL #6   Title Pt will consistently demonstrate awareness of neutral neck and shoulder posture by 07/07/15    Status On-going               Plan - 05/31/15 1339    Clinical Impression Statement Pt reporting low back pain worse than neck pain today, therefore concentrated focus on low back program today. Able to progress complexity and resistance with lumbar stabilization exercises without increased pain.   PT Treatment/Interventions Patient/family education;Manual techniques;Passive range of motion;Taping;Therapeutic exercise;Therapeutic  activities;Functional mobility training;Gait training;Neuromuscular re-education;Moist Heat;Electrical Stimulation;Cryotherapy;Traction;ADLs/Self Care Home Management   PT Next Visit Plan Review new cervical HEP; Manual therapy and modalities PRN to manage pain; Postural training; Progress cervical & lumbar flexibility and stabilization exercises as tolerated and update HEPs as appropriate   Consulted and Agree with Plan of Care Patient      Patient will benefit from skilled therapeutic intervention in order to improve the following deficits and impairments:  Pain, Impaired flexibility, Decreased range of motion, Increased muscle spasms, Decreased strength,  Difficulty walking, Decreased activity tolerance, Postural dysfunction, Impaired perceived functional ability, Improper body mechanics  Visit Diagnosis: Bilateral low back pain with right-sided sciatica  Difficulty in walking, not elsewhere classified  Cervicalgia     Problem List Patient Active Problem List   Diagnosis Date Noted  . Neck pain 04/22/2015  . Side pain 04/05/2015  . Ear pain 02/28/2015  . Abnormal urine 11/21/2014  . 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 Anemia 06/08/2014  . RLS (restless legs syndrome) 11/23/2013  . Elevated troponin 11/13/2013  . Diabetic peripheral neuropathy ([place]) 10/29/2013  . Left-sided thoracic back pain 10/06/2013  . Encounter for preventative adult health care exam with abnormal findings 09/14/2013  . Iron deficiency anemia   . Status post laparoscopy 02/25/2013  . GERD (gastroesophageal reflux disease) 01/09/2013  . Amaurosis fugax of left eye 10/16/2012  . Low back pain 06/03/2012  . Vitamin D deficiency 03/11/2012  . Bilateral hand pain 10/20/2011  . Encounter for long-term (current) use of other medications 10/20/2011  . IBS (irritable bowel syndrome) 08/14/2011  . TIA (transient ischemic attack) 02/10/2011  . Abnormal brain MRI 01/19/2011  . Allergic  rhinitis 10/01/2010  . 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 FUNDIC GLAND POLYPS OF STOMACH 03/18/2010  . EPIGASTRIC PAIN chronic 03/18/2010  . ARTHRALGIA 03/17/2009  . SYSTOLIC MURMUR [id number]  . Paroxysmal supraventricular tachycardia ([place]) 01/12/2009  . PLANTAR FASCIITIS 06/08/2008  . CHEST PAIN 05/18/2008  . Gastroparesis 12/18/2007  . HYPERCHOLESTEROLEMIA 06/11/2007  . Diabetes mellitus type 2 in obese ([place]) 09/05/2006  . Essential hypertension 09/05/2006    Percival Spanish, PT, MPT 05/31/2015, 2:03 PM  [facility] [address]  [place], [place], [address] Phone: [phone]   Fax:  [phone]  Name: FLORINDA JULSON MRN: [id number] Date of Birth: 25-Aug-1940

## 2015-06-03 ENCOUNTER — Ambulatory Visit (HOSPITAL_BASED_OUTPATIENT_CLINIC_OR_DEPARTMENT_OTHER)
Admission: RE | Admit: 2015-06-03 | Discharge: 2015-06-03 | Disposition: A | Discharge status: Home / Self Care | Payer: Medicare Other | Source: Ambulatory Visit | Attending: Family Medicine | Admitting: Family Medicine

## 2015-06-03 ENCOUNTER — Ambulatory Visit: Payer: Medicare Other | Admitting: Physical Therapy

## 2015-06-03 DIAGNOSIS — M542 Cervicalgia: Secondary | ICD-10-CM | POA: Diagnosis not present

## 2015-06-03 DIAGNOSIS — I471 Supraventricular tachycardia, unspecified: Secondary | ICD-10-CM

## 2015-06-03 DIAGNOSIS — R1013 Epigastric pain: Secondary | ICD-10-CM

## 2015-06-03 DIAGNOSIS — R262 Difficulty in walking, not elsewhere classified: Secondary | ICD-10-CM

## 2015-06-03 DIAGNOSIS — E1169 Type 2 diabetes mellitus with other specified complication: Secondary | ICD-10-CM

## 2015-06-03 DIAGNOSIS — M5441 Lumbago with sciatica, right side: Secondary | ICD-10-CM

## 2015-06-03 DIAGNOSIS — R1010 Upper abdominal pain, unspecified: Secondary | ICD-10-CM

## 2015-06-03 DIAGNOSIS — D509 Iron deficiency anemia, unspecified: Secondary | ICD-10-CM

## 2015-06-03 DIAGNOSIS — I7090 Unspecified atherosclerosis: Secondary | ICD-10-CM | POA: Insufficient documentation

## 2015-06-03 DIAGNOSIS — E669 Obesity, unspecified: Secondary | ICD-10-CM | POA: Diagnosis not present

## 2015-06-03 DIAGNOSIS — I2510 Atherosclerotic heart disease of native coronary artery without angina pectoris: Secondary | ICD-10-CM | POA: Diagnosis not present

## 2015-06-03 DIAGNOSIS — K219 Gastro-esophageal reflux disease without esophagitis: Secondary | ICD-10-CM | POA: Diagnosis not present

## 2015-06-03 DIAGNOSIS — I10 Essential (primary) hypertension: Secondary | ICD-10-CM | POA: Diagnosis not present

## 2015-06-03 DIAGNOSIS — E782 Mixed hyperlipidemia: Secondary | ICD-10-CM | POA: Diagnosis not present

## 2015-06-03 DIAGNOSIS — E1142 Type 2 diabetes mellitus with diabetic polyneuropathy: Secondary | ICD-10-CM | POA: Diagnosis not present

## 2015-06-03 DIAGNOSIS — I700 Atherosclerosis of aorta: Secondary | ICD-10-CM

## 2015-06-03 NOTE — Therapy (Signed)
[facility] [address]  [place], [place], [address] Phone: [phone]   Fax:  [phone]  Physical Therapy Treatment  Patient Details  Name: BRYAN MOLDENHAUER MRN: [id number] Date of Birth: 1940/08/20 Referring Provider: [name], MD  Encounter Date: 06/03/2015      PT End of Session - 06/03/15 1322    Visit Number 6   Number of Visits 16   Date for PT Re-Evaluation 07/07/15   PT Start Time [id number]   PT Stop Time 1400   PT Time Calculation (min) 45 min   Activity Tolerance Patient tolerated treatment well   Behavior During Therapy [facility] for tasks assessed/performed      Past Medical History  Diagnosis Date  . Gastroparesis   . Pure hypercholesterolemia   . Headache(784.0)   . Unspecified essential hypertension   . Esophageal reflux   . Type II or unspecified type diabetes mellitus without mention of complication, not stated as uncontrolled   . Arthritis     Spinal Osteoarthritis  . Diverticulosis 08/30/2000    Colonoscopy   . Gastric polyp     Fundic Gland  . Anxiety   . Cataract   . Heart murmur     Echocardiogram 2/11: EF 60-65%, mild LAE, grade 1 diastolic dysfunction, aortic valve sclerosis, mean gradient 9 mm of mercury, PASP 34  . Chest pain     Myoview 12/15 no ischemia.  . Carcinoid tumor of stomach   . PSVT (paroxysmal supraventricular tachycardia) ([place])   . Leg swelling     bilateral  . Chronic kidney disease     Left kidney smaller than right kidney  . Cancer ([place])   . PONV (postoperative nausea and vomiting)     pt states only needs small amount of anesthesia  . Stroke ([place])     tia, 2014  . Iron deficiency anemia, unspecified   . Encounter for preventative adult health care exam with abnormal findings 09/14/2013  . Unspecified hereditary and idiopathic peripheral neuropathy 10/29/2013  . Diabetic peripheral neuropathy ([place]) 10/29/2013  . Diabetes mellitus type 2 in obese ([place])  09/05/2006    Qualifier: Diagnosis of  By: Marca Ancona RMA, Lucy    . Anemia 06/08/2014  . 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 Neck pain 04/22/2015    Past Surgical History  Procedure Laterality Date  . Cholecystectomy  1993  . Tonsillectomy    . Colonoscopy  11/11/2010    diverticulosis  . Esophagogastroduodenoscopy  08/29/2010; 09/15/2010    Carcinoid tumor less than 1 cm in July 2012 not seen in August 2012 , gastritis, fundic gland polyps  . Eus  12/15/2010    Procedure: UPPER ENDOSCOPIC ULTRASOUND (EUS) LINEAR;  Surgeon: Owens Loffler, MD;  Location: [facility];  Service: Endoscopy;  Laterality: N/A;  . Esophagogastroduodenoscopy  05/16/2011  . Esophagogastroduodenoscopy  06/14/2012  . Dilatation & currettage/hysteroscopy with resectocope N/A 02/25/2013    Procedure: Attempted hysteroscopy with uterine perforation;  Surgeon: Jamey Reas de Berton Lan, MD;  Location: [facility];  Service: Gynecology;  Laterality: N/A;  . Laparoscopy N/A 02/25/2013    Procedure: Cystoscopy and laparoscopy with fulguration of uterine serosa;  Surgeon: Jamey Reas de Berton Lan, MD;  Location: [facility];  Service: Gynecology;  Laterality: N/A;  . Eye surgery Bilateral     Bi lateral cateracts and bi lateral laser    There were no vitals filed for this  visit.      Subjective Assessment - 06/03/15 1319    Subjective Pt states pain in her low back was bad yesterday, mostly on the right with right sided sciatica "all the way down my leg".   Currently in Pain? Yes   Pain Score 7    Pain Location Back   Pain Orientation Lower   Pain Score 8   Pain Location Neck   Pain Orientation Left;Lower;Posterior;Lateral          Today's Treatment  TherEx NuStep - lvl 4 x 5'  Manual  B Hamstring, SKTC, and Figure 4 & KTOS piriformis stretches 2x30"  TherEx DKTC with feet on peanut ball 10x3" Bridge with feet on peanut ball 10x3" LTR 10x5" Hooklying Abdominal bracing + Pelvic  tilt 10x3" TrA + Hooklying Shoulder flexion pullover with 3# 10x3" TrA + Dead bug x10 TrA + Hooklying Alternating Hip ABD/ER with green TB x10 TrA + Hooklying B Shoulder Horizontal ABD with green TB x10 TrA + Hooklying March with green TB x10 TrA + Hooklying Alternating Shoulder Flexion & Extension with red TB x10 BATCA Low Row (w/o anterior chest pad) 15# 2x10          PT Education - 06/03/15 1611    Education provided Yes   Education Details Green TB provided for lumbar HEP; pt to continue with red TB for cervical exercises   Person(s) Educated Patient   Methods Explanation;Demonstration   Comprehension Verbalized understanding;Returned demonstration          PT Short Term Goals - 05/31/15 1401    PT SHORT TERM GOAL #1   Title Independent with initial HEP by 06/09/15   Status On-going           PT Long Term Goals - 05/31/15 1401    PT LONG TERM GOAL #1   Title Independent with advanced HEP as indicated by 07/07/15   Status On-going   PT LONG TERM GOAL #2   Title Pt will demonstrate lumbar ROM 50% of normal or greater w/o increased pain by 07/07/15   Status On-going   PT LONG TERM GOAL #3   Title Pt will tolerate sitting for 30 minutes or greater without increased neck, low back or leg pain while watching TV or working on computer by 07/07/15   Status Revised   PT LONG TERM GOAL #4   Title Pt will report ability to perform normal ADL's and light household chores without increased low back or leg pain by 07/07/15   Status On-going   PT LONG TERM GOAL #5   Title Pt will demonstrate increased cervical ROM in all planes by at least 10 degrees (5 degrees for extension) to allow for functional ROM of neck by 07/07/15   Status On-going   PT LONG TERM GOAL #6   Title Pt will consistently demonstrate awareness of neutral neck and shoulder posture by 07/07/15    Status On-going               Plan - 06/03/15 1607    Clinical Impression Statement Pt reporting worsening  of her low back pain with R sided radiculopathy but noted some improvement after completing her HEP. Continues to report high levels of pain in bothe neck and low back, although pain behavior doe not often correlate with ratings. Attempted to address both neck and low back pain with therapeutic exercises today, with pt able to tolerate some progression of exercises and resistance today.   PT Treatment/Interventions Patient/family  education;Manual techniques;Passive range of motion;Taping;Therapeutic exercise;Therapeutic activities;Functional mobility training;Gait training;Neuromuscular re-education;Moist Heat;Electrical Stimulation;Cryotherapy;Traction;ADLs/Self Care Home Management   PT Next Visit Plan Manual therapy and modalities PRN to manage pain; Postural training; Progress cervical & lumbar flexibility and stabilization exercises as tolerated and update HEPs as appropriate   Consulted and Agree with Plan of Care Patient      Patient will benefit from skilled therapeutic intervention in order to improve the following deficits and impairments:  Pain, Impaired flexibility, Decreased range of motion, Increased muscle spasms, Decreased strength, Difficulty walking, Decreased activity tolerance, Postural dysfunction, Impaired perceived functional ability, Improper body mechanics  Visit Diagnosis: Bilateral low back pain with right-sided sciatica  Difficulty in walking, not elsewhere classified  Cervicalgia     Problem List Patient Active Problem List   Diagnosis Date Noted  . Neck pain 04/22/2015  . Side pain 04/05/2015  . Ear pain 02/28/2015  . Abnormal urine 11/21/2014  . 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 Anemia 06/08/2014  . RLS (restless legs syndrome) 11/23/2013  . Elevated troponin 11/13/2013  . Diabetic peripheral neuropathy ([place]) 10/29/2013  . Left-sided thoracic back pain 10/06/2013  . Encounter for preventative adult health care exam with abnormal  findings 09/14/2013  . Iron deficiency anemia   . Status post laparoscopy 02/25/2013  . GERD (gastroesophageal reflux disease) 01/09/2013  . Amaurosis fugax of left eye 10/16/2012  . Low back pain 06/03/2012  . Vitamin D deficiency 03/11/2012  . Bilateral hand pain 10/20/2011  . Encounter for long-term (current) use of other medications 10/20/2011  . IBS (irritable bowel syndrome) 08/14/2011  . TIA (transient ischemic attack) 02/10/2011  . Abnormal brain MRI 01/19/2011  . Allergic rhinitis 10/01/2010  . 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 FUNDIC GLAND POLYPS OF STOMACH 03/18/2010  . EPIGASTRIC PAIN chronic 03/18/2010  . ARTHRALGIA 03/17/2009  . SYSTOLIC MURMUR [id number]  . Paroxysmal supraventricular tachycardia ([place]) 01/12/2009  . PLANTAR FASCIITIS 06/08/2008  . CHEST PAIN 05/18/2008  . Gastroparesis 12/18/2007  . HYPERCHOLESTEROLEMIA 06/11/2007  . Diabetes mellitus type 2 in obese ([place]) 09/05/2006  . Essential hypertension 09/05/2006    Percival Spanish, PT, MPT 06/03/2015, 4:14 PM  [facility] [address]  [place], [place], [address] Phone: [phone]   Fax:  [phone]  Name: ANGELUS CONCA MRN: [id number] Date of Birth: November 15, 1940

## 2015-06-06 DIAGNOSIS — R1010 Upper abdominal pain, unspecified: Secondary | ICD-10-CM | POA: Insufficient documentation

## 2015-06-06 NOTE — Assessment & Plan Note (Signed)
hgba1c acceptable, minimize simple carbs. Increase exercise as tolerated. Continue current meds 

## 2015-06-06 NOTE — Assessment & Plan Note (Signed)
Aortic Ultrasound only shows atherosclerosis. Patient to report any further or worsening symptoms. Avoid offending foods

## 2015-06-06 NOTE — Assessment & Plan Note (Signed)
Avoid offending foods, start probiotics. Do not eat large meals in late evening and consider raising head of bed.  

## 2015-06-06 NOTE — Assessment & Plan Note (Signed)
Continue supplementation  ?

## 2015-06-07 ENCOUNTER — Ambulatory Visit: Payer: Medicare Other | Attending: Family Medicine | Admitting: Physical Therapy

## 2015-06-07 DIAGNOSIS — M542 Cervicalgia: Secondary | ICD-10-CM | POA: Insufficient documentation

## 2015-06-07 DIAGNOSIS — M5441 Lumbago with sciatica, right side: Secondary | ICD-10-CM | POA: Insufficient documentation

## 2015-06-07 DIAGNOSIS — R262 Difficulty in walking, not elsewhere classified: Secondary | ICD-10-CM | POA: Insufficient documentation

## 2015-06-07 NOTE — Therapy (Signed)
[facility] [address]  [place], [place], [address] Phone: [phone]   Fax:  [phone]  Physical Therapy Treatment  Patient Details  Name: [name] MRN: [id number] Date of Birth: 09/20/1940 Referring Provider: [name], MD  Encounter Date: 06/07/2015      PT End of Session - 06/07/15 1321    Visit Number 7   Number of Visits 16   Date for PT Re-Evaluation 07/07/15   PT Start Time [id number]   PT Stop Time 1401   PT Time Calculation (min) 46 min   Activity Tolerance Patient tolerated treatment well   Behavior During Therapy [facility] for tasks assessed/performed      Past Medical History  Diagnosis Date  . Gastroparesis   . Pure hypercholesterolemia   . Headache(784.0)   . Unspecified essential hypertension   . Esophageal reflux   . Type II or unspecified type diabetes mellitus without mention of complication, not stated as uncontrolled   . Arthritis     Spinal Osteoarthritis  . Diverticulosis 08/30/2000    Colonoscopy   . Gastric polyp     Fundic Gland  . Anxiety   . Cataract   . Heart murmur     Echocardiogram 2/11: EF 60-65%, mild LAE, grade 1 diastolic dysfunction, aortic valve sclerosis, mean gradient 9 mm of mercury, PASP 34  . Chest pain     Myoview 12/15 no ischemia.  . Carcinoid tumor of stomach   . PSVT (paroxysmal supraventricular tachycardia) ([place])   . Leg swelling     bilateral  . Chronic kidney disease     Left kidney smaller than right kidney  . Cancer ([place])   . PONV (postoperative nausea and vomiting)     pt states only needs small amount of anesthesia  . Stroke ([place])     tia, 2014  . Iron deficiency anemia, unspecified   . Encounter for preventative adult health care exam with abnormal findings 09/14/2013  . Unspecified hereditary and idiopathic peripheral neuropathy 10/29/2013  . Diabetic peripheral neuropathy ([place]) 10/29/2013  . Diabetes mellitus type 2 in obese ([place])  09/05/2006    Qualifier: Diagnosis of  By: Marca Ancona RMA, Lucy    . Anemia 06/08/2014  . 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 Neck pain 04/22/2015    Past Surgical History  Procedure Laterality Date  . Cholecystectomy  1993  . Tonsillectomy    . Colonoscopy  11/11/2010    diverticulosis  . Esophagogastroduodenoscopy  08/29/2010; 09/15/2010    Carcinoid tumor less than 1 cm in July 2012 not seen in August 2012 , gastritis, fundic gland polyps  . Eus  12/15/2010    Procedure: UPPER ENDOSCOPIC ULTRASOUND (EUS) LINEAR;  Surgeon: Owens Loffler, MD;  Location: [facility];  Service: Endoscopy;  Laterality: N/A;  . Esophagogastroduodenoscopy  05/16/2011  . Esophagogastroduodenoscopy  06/14/2012  . Dilatation & currettage/hysteroscopy with resectocope N/A 02/25/2013    Procedure: Attempted hysteroscopy with uterine perforation;  Surgeon: Jamey Reas de Berton Lan, MD;  Location: [facility];  Service: Gynecology;  Laterality: N/A;  . Laparoscopy N/A 02/25/2013    Procedure: Cystoscopy and laparoscopy with fulguration of uterine serosa;  Surgeon: Jamey Reas de Berton Lan, MD;  Location: [facility];  Service: Gynecology;  Laterality: N/A;  . Eye surgery Bilateral     Bi lateral cateracts and bi lateral laser    There were no vitals filed for this  visit.      Subjective Assessment - 06/07/15 1321    Subjective Pt noting increased soreness, especially in buttocks, after last visit. Today, neck pain is worse.   Patient Stated Goals "Make the pain better"   Currently in Pain? Yes   Pain Score 7    Pain Location Back   Pain Orientation Lower;Right   Pain Descriptors / Indicators Sharp   Pain Score 8   Pain Location Neck   Pain Orientation Left;Lateral   Pain Descriptors / Indicators Sharp         Today's Treatment  TherEx NuStep - lvl 4 x 5'  Manual  B UT & levator stretches SOC STM to B cervical paraspinals, UT & levator  TherEx Hooklying on pink pool  noodle:   Chest stretch x1'   Chin tuck with thin folded pillow under head x10   B Shoulder Horiz ABD + Scap retratction with red TB 15x3"  B Shoulder ER with red TB 15x3"   B Shoulder Flexion pullover with 4# x10   B Alternating Shoulder Flexion & Extension with red TB x10   B Shoulder protraction/Serratus punch 2# x10 each B Shoulder Rows with red TB 15x3" B Scapular Retraction + Shoulder Extension to neutral with red TB 15x3" B Seated UT stretch with hand grip on edge of seat 3x20" Doorway Pec stretch 3x20"          PT Short Term Goals - 06/07/15 1406    PT SHORT TERM GOAL #1   Title Independent with initial HEP by 06/09/15   Status Achieved           PT Long Term Goals - 06/07/15 1405    PT LONG TERM GOAL #1   Title Independent with advanced HEP as indicated by 07/07/15   Status On-going   PT LONG TERM GOAL #2   Title Pt will demonstrate lumbar ROM 50% of normal or greater w/o increased pain by 07/07/15   Status On-going   PT LONG TERM GOAL #3   Title Pt will tolerate sitting for 30 minutes or greater without increased neck, low back or leg pain while watching TV or working on computer by 07/07/15   Status On-going   PT LONG TERM GOAL #4   Title Pt will report ability to perform normal ADL's and light household chores without increased low back or leg pain by 07/07/15   Status On-going   PT LONG TERM GOAL #5   Title Pt will demonstrate increased cervical ROM in all planes by at least 10 degrees (5 degrees for extension) to allow for functional ROM of neck by 07/07/15   Status On-going   PT LONG TERM GOAL #6   Title Pt will consistently demonstrate awareness of neutral neck and shoulder posture by 07/07/15    Status On-going               Plan - 06/07/15 1403    Clinical Impression Statement Pt c/o increased neck pain today, therefore focused on cervical spine today with STM, stretching and therapeutic exercises. Pt with limited tolerance for positioning on pool  noodle but did demonstrate improved posture after positional chest stretch over noodle.   PT Treatment/Interventions Patient/family education;Manual techniques;Passive range of motion;Taping;Therapeutic exercise;Therapeutic activities;Functional mobility training;Gait training;Neuromuscular re-education;Moist Heat;Electrical Stimulation;Cryotherapy;Traction;ADLs/Self Care Home Management   PT Next Visit Plan Manual therapy and modalities PRN to manage pain; Postural training; Progress cervical & lumbar flexibility and stabilization exercises as tolerated and update HEPs as appropriate  Consulted and Agree with Plan of Care Patient      Patient will benefit from skilled therapeutic intervention in order to improve the following deficits and impairments:  Pain, Impaired flexibility, Decreased range of motion, Increased muscle spasms, Decreased strength, Difficulty walking, Decreased activity tolerance, Postural dysfunction, Impaired perceived functional ability, Improper body mechanics  Visit Diagnosis: Bilateral low back pain with right-sided sciatica  Difficulty in walking, not elsewhere classified  Cervicalgia     Problem List Patient Active Problem List   Diagnosis Date Noted  . Pain of upper abdomen 06/06/2015  . Neck pain 04/22/2015  . Side pain 04/05/2015  . Ear pain 02/28/2015  . Abnormal urine 11/21/2014  . 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 Anemia 06/08/2014  . RLS (restless legs syndrome) 11/23/2013  . Diabetic peripheral neuropathy ([place]) 10/29/2013  . Left-sided thoracic back pain 10/06/2013  . Encounter for preventative adult health care exam with abnormal findings 09/14/2013  . Iron deficiency anemia   . Status post laparoscopy 02/25/2013  . GERD (gastroesophageal reflux disease) 01/09/2013  . Amaurosis fugax of left eye 10/16/2012  . Low back pain 06/03/2012  . Vitamin D deficiency 03/11/2012  . Bilateral hand pain 10/20/2011  . Encounter  for long-term (current) use of other medications 10/20/2011  . IBS (irritable bowel syndrome) 08/14/2011  . TIA (transient ischemic attack) 02/10/2011  . Abnormal brain MRI 01/19/2011  . Allergic rhinitis 10/01/2010  . 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 FUNDIC GLAND POLYPS OF STOMACH 03/18/2010  . EPIGASTRIC PAIN chronic 03/18/2010  . ARTHRALGIA 03/17/2009  . SYSTOLIC MURMUR [id number]  . Paroxysmal supraventricular tachycardia ([place]) 01/12/2009  . PLANTAR FASCIITIS 06/08/2008  . CHEST PAIN 05/18/2008  . Gastroparesis 12/18/2007  . HYPERCHOLESTEROLEMIA 06/11/2007  . Diabetes mellitus type 2 in obese ([place]) 09/05/2006  . Essential hypertension 09/05/2006    Percival Spanish, PT, MPT 06/07/2015, 2:13 PM  [facility] [address]  [place], [place], [address] Phone: [phone]   Fax:  [phone]  Name: CARLYLE SIMEON MRN: [id number] Date of Birth: 01-19-1941

## 2015-06-10 ENCOUNTER — Ambulatory Visit: Payer: Medicare Other | Admitting: Physical Therapy

## 2015-06-10 DIAGNOSIS — M542 Cervicalgia: Secondary | ICD-10-CM

## 2015-06-10 DIAGNOSIS — R262 Difficulty in walking, not elsewhere classified: Secondary | ICD-10-CM

## 2015-06-10 DIAGNOSIS — M5441 Lumbago with sciatica, right side: Secondary | ICD-10-CM

## 2015-06-10 NOTE — Therapy (Signed)
[facility] [address]  [place], [place], [address] Phone: [phone]   Fax:  [phone]  Physical Therapy Treatment  Patient Details  Name: [name] MRN: [id number] Date of Birth: December 04, 1940 Referring Provider: [name], MD  Encounter Date: 06/10/2015      PT End of Session - 06/10/15 1340    Visit Number 8   Number of Visits 16   Date for PT Re-Evaluation 07/07/15   PT Start Time 1320   PT Stop Time 1411   PT Time Calculation (min) 51 min   Activity Tolerance Patient tolerated treatment well   Behavior During Therapy [facility] for tasks assessed/performed      Past Medical History  Diagnosis Date  . Gastroparesis   . Pure hypercholesterolemia   . Headache(784.0)   . Unspecified essential hypertension   . Esophageal reflux   . Type II or unspecified type diabetes mellitus without mention of complication, not stated as uncontrolled   . Arthritis     Spinal Osteoarthritis  . Diverticulosis 08/30/2000    Colonoscopy   . Gastric polyp     Fundic Gland  . Anxiety   . Cataract   . Heart murmur     Echocardiogram 2/11: EF 60-65%, mild LAE, grade 1 diastolic dysfunction, aortic valve sclerosis, mean gradient 9 mm of mercury, PASP 34  . Chest pain     Myoview 12/15 no ischemia.  . Carcinoid tumor of stomach   . PSVT (paroxysmal supraventricular tachycardia) ([place])   . Leg swelling     bilateral  . Chronic kidney disease     Left kidney smaller than right kidney  . Cancer ([place])   . PONV (postoperative nausea and vomiting)     pt states only needs small amount of anesthesia  . Stroke ([place])     tia, 2014  . Iron deficiency anemia, unspecified   . Encounter for preventative adult health care exam with abnormal findings 09/14/2013  . Unspecified hereditary and idiopathic peripheral neuropathy 10/29/2013  . Diabetic peripheral neuropathy ([place]) 10/29/2013  . Diabetes mellitus type 2 in obese ([place])  09/05/2006    Qualifier: Diagnosis of  By: Marca Ancona RMA, Lucy    . Anemia 06/08/2014  . 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 Neck pain 04/22/2015    Past Surgical History  Procedure Laterality Date  . Cholecystectomy  1993  . Tonsillectomy    . Colonoscopy  11/11/2010    diverticulosis  . Esophagogastroduodenoscopy  08/29/2010; 09/15/2010    Carcinoid tumor less than 1 cm in July 2012 not seen in August 2012 , gastritis, fundic gland polyps  . Eus  12/15/2010    Procedure: UPPER ENDOSCOPIC ULTRASOUND (EUS) LINEAR;  Surgeon: Owens Loffler, MD;  Location: [facility];  Service: Endoscopy;  Laterality: N/A;  . Esophagogastroduodenoscopy  05/16/2011  . Esophagogastroduodenoscopy  06/14/2012  . Dilatation & currettage/hysteroscopy with resectocope N/A 02/25/2013    Procedure: Attempted hysteroscopy with uterine perforation;  Surgeon: Jamey Reas de Berton Lan, MD;  Location: [facility];  Service: Gynecology;  Laterality: N/A;  . Laparoscopy N/A 02/25/2013    Procedure: Cystoscopy and laparoscopy with fulguration of uterine serosa;  Surgeon: Jamey Reas de Berton Lan, MD;  Location: [facility];  Service: Gynecology;  Laterality: N/A;  . Eye surgery Bilateral     Bi lateral cateracts and bi lateral laser    There were no vitals filed for this  visit.      Subjective Assessment - 06/10/15 1326    Subjective Pt reports she woke today with stabbing pain in R low back with sciatic pain and numbess down R LE to ~knee. States back pan is so bad that se doesn't even notice the neck today. Pt has not tried sleeping with pillow between legs when on her side as recommended.   Currently in Pain? Yes   Pain Score 10-Worst pain ever   Pain Location Back   Pain Orientation Lower;Right   Pain Descriptors / Indicators Stabbing   Pain Radiating Towards R lateral leg to knee           Today's Treatment  TherEx UBE lvl 1.0 Fwd/back x1' each Seated prayer stretch with green (65  cm) Pball - L/C/R 2x20"  Manual  B Hamstring, SKTC, and Figure 4 & KTOS piriformis stretches 2x30" STM/DTM to pt tolerance to R pirifomis and glut in L sidelying  Instruction in piriformis stretches for HEP in supine & sitting, along with self piriformis release using tennis ball  Modalities Moist heat pack to R Low back & Hip in L sidelying x10'          PT Education - 06/10/15 1408    Education provided Yes   Education Details Piriformis stretches   Person(s) Educated Patient   Methods Explanation;Demonstration;Handout   Comprehension Verbalized understanding;Returned demonstration          PT Short Term Goals - 06/07/15 1406    PT SHORT TERM GOAL #1   Title Independent with initial HEP by 06/09/15   Status Achieved           PT Long Term Goals - 06/07/15 1405    PT LONG TERM GOAL #1   Title Independent with advanced HEP as indicated by 07/07/15   Status On-going   PT LONG TERM GOAL #2   Title Pt will demonstrate lumbar ROM 50% of normal or greater w/o increased pain by 07/07/15   Status On-going   PT LONG TERM GOAL #3   Title Pt will tolerate sitting for 30 minutes or greater without increased neck, low back or leg pain while watching TV or working on computer by 07/07/15   Status On-going   PT LONG TERM GOAL #4   Title Pt will report ability to perform normal ADL's and light household chores without increased low back or leg pain by 07/07/15   Status On-going   PT LONG TERM GOAL #5   Title Pt will demonstrate increased cervical ROM in all planes by at least 10 degrees (5 degrees for extension) to allow for functional ROM of neck by 07/07/15   Status On-going   PT LONG TERM GOAL #6   Title Pt will consistently demonstrate awareness of neutral neck and shoulder posture by 07/07/15    Status On-going               Plan - 06/10/15 1409    Clinical Impression Statement Pt presents to PT with c/o 10/10 R sided stabbing LBP with worsening of R sciatica.  Assessment revealed significant ttp and increased muscle tension in R piriformis, therefore targeted STM and stretching to piriformis and gluteals followed by moist heat to promote continued muscle relaxation. PT wanted to try estim in conjunction with moist heat to assist with pain control but pt remains reluctant to allow PT to try estim due previous lack of response to estim when receiving therapy for her neck during another therapy  episode. Taping also deferred as pt reports sensitive skin with h/o adverse reaction to medical adhesives from cardiac electrodes. Pt reports pain much better after treatment today, but may consider [place] if pain flares up again.   PT Treatment/Interventions Patient/family education;Manual techniques;Passive range of motion;Taping;Therapeutic exercise;Therapeutic activities;Functional mobility training;Gait training;Neuromuscular re-education;Moist Heat;Electrical Stimulation;Cryotherapy;Traction;ADLs/Self Care Home Management   PT Next Visit Plan Manual therapy and modalities PRN to manage pain; Postural training; Progress cervical & lumbar flexibility and stabilization exercises as tolerated and update HEPs as appropriate   Consulted and Agree with Plan of Care Patient      Patient will benefit from skilled therapeutic intervention in order to improve the following deficits and impairments:  Pain, Impaired flexibility, Decreased range of motion, Increased muscle spasms, Decreased strength, Difficulty walking, Decreased activity tolerance, Postural dysfunction, Impaired perceived functional ability, Improper body mechanics  Visit Diagnosis: Bilateral low back pain with right-sided sciatica  Difficulty in walking, not elsewhere classified  Cervicalgia     Problem List Patient Active Problem List   Diagnosis Date Noted  . Pain of upper abdomen 06/06/2015  . Neck pain 04/22/2015  . Side pain 04/05/2015  . Ear pain 02/28/2015  . Abnormal urine 11/21/2014  . 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 Anemia 06/08/2014  . RLS (restless legs syndrome) 11/23/2013  . Diabetic peripheral neuropathy ([place]) 10/29/2013  . Left-sided thoracic back pain 10/06/2013  . Encounter for preventative adult health care exam with abnormal findings 09/14/2013  . Iron deficiency anemia   . Status post laparoscopy 02/25/2013  . GERD (gastroesophageal reflux disease) 01/09/2013  . Amaurosis fugax of left eye 10/16/2012  . Low back pain 06/03/2012  . Vitamin D deficiency 03/11/2012  . Bilateral hand pain 10/20/2011  . Encounter for long-term (current) use of other medications 10/20/2011  . IBS (irritable bowel syndrome) 08/14/2011  . TIA (transient ischemic attack) 02/10/2011  . Abnormal brain MRI 01/19/2011  . Allergic rhinitis 10/01/2010  . 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 FUNDIC GLAND POLYPS OF STOMACH 03/18/2010  . EPIGASTRIC PAIN chronic 03/18/2010  . ARTHRALGIA 03/17/2009  . SYSTOLIC MURMUR [id number]  . Paroxysmal supraventricular tachycardia ([place]) 01/12/2009  . PLANTAR FASCIITIS 06/08/2008  . CHEST PAIN 05/18/2008  . Gastroparesis 12/18/2007  . HYPERCHOLESTEROLEMIA 06/11/2007  . Diabetes mellitus type 2 in obese ([place]) 09/05/2006  . Essential hypertension 09/05/2006    Percival Spanish, PT, MPT 06/10/2015, 5:33 PM  [facility] [address]  [place], [place], [address] Phone: [phone]   Fax:  [phone]  Name: TREMAINE DAMA MRN: [id number] Date of Birth: November 14, 1940

## 2015-06-15 ENCOUNTER — Ambulatory Visit: Payer: Medicare Other | Admitting: Physical Therapy

## 2015-06-15 DIAGNOSIS — R262 Difficulty in walking, not elsewhere classified: Secondary | ICD-10-CM | POA: Diagnosis not present

## 2015-06-15 DIAGNOSIS — M5441 Lumbago with sciatica, right side: Secondary | ICD-10-CM

## 2015-06-15 DIAGNOSIS — M542 Cervicalgia: Secondary | ICD-10-CM | POA: Diagnosis not present

## 2015-06-15 NOTE — Therapy (Signed)
[facility] [address]  [place], [place], [address] Phone: [phone]   Fax:  [phone]  Physical Therapy Treatment  Patient Details  Name: [name] MRN: [id number] Date of Birth: 30-Apr-1940 Referring Provider: [name], MD  Encounter Date: 06/15/2015      PT End of Session - 06/15/15 1318    Visit Number 9   Number of Visits 16   Date for PT Re-Evaluation 07/07/15   PT Start Time [id number]   PT Stop Time 1358   PT Time Calculation (min) 43 min   Activity Tolerance Patient tolerated treatment well   Behavior During Therapy [facility] for tasks assessed/performed      Past Medical History  Diagnosis Date  . Gastroparesis   . Pure hypercholesterolemia   . Headache(784.0)   . Unspecified essential hypertension   . Esophageal reflux   . Type II or unspecified type diabetes mellitus without mention of complication, not stated as uncontrolled   . Arthritis     Spinal Osteoarthritis  . Diverticulosis 08/30/2000    Colonoscopy   . Gastric polyp     Fundic Gland  . Anxiety   . Cataract   . Heart murmur     Echocardiogram 2/11: EF 60-65%, mild LAE, grade 1 diastolic dysfunction, aortic valve sclerosis, mean gradient 9 mm of mercury, PASP 34  . Chest pain     Myoview 12/15 no ischemia.  . Carcinoid tumor of stomach   . PSVT (paroxysmal supraventricular tachycardia) ([place])   . Leg swelling     bilateral  . Chronic kidney disease     Left kidney smaller than right kidney  . Cancer ([place])   . PONV (postoperative nausea and vomiting)     pt states only needs small amount of anesthesia  . Stroke ([place])     tia, 2014  . Iron deficiency anemia, unspecified   . Encounter for preventative adult health care exam with abnormal findings 09/14/2013  . Unspecified hereditary and idiopathic peripheral neuropathy 10/29/2013  . Diabetic peripheral neuropathy ([place]) 10/29/2013  . Diabetes mellitus type 2 in obese ([place])  09/05/2006    Qualifier: Diagnosis of  By: Marca Ancona RMA, Lucy    . Anemia 06/08/2014  . 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 Neck pain 04/22/2015    Past Surgical History  Procedure Laterality Date  . Cholecystectomy  1993  . Tonsillectomy    . Colonoscopy  11/11/2010    diverticulosis  . Esophagogastroduodenoscopy  08/29/2010; 09/15/2010    Carcinoid tumor less than 1 cm in July 2012 not seen in August 2012 , gastritis, fundic gland polyps  . Eus  12/15/2010    Procedure: UPPER ENDOSCOPIC ULTRASOUND (EUS) LINEAR;  Surgeon: Owens Loffler, MD;  Location: [facility];  Service: Endoscopy;  Laterality: N/A;  . Esophagogastroduodenoscopy  05/16/2011  . Esophagogastroduodenoscopy  06/14/2012  . Dilatation & currettage/hysteroscopy with resectocope N/A 02/25/2013    Procedure: Attempted hysteroscopy with uterine perforation;  Surgeon: Jamey Reas de Berton Lan, MD;  Location: [facility];  Service: Gynecology;  Laterality: N/A;  . Laparoscopy N/A 02/25/2013    Procedure: Cystoscopy and laparoscopy with fulguration of uterine serosa;  Surgeon: Jamey Reas de Berton Lan, MD;  Location: [facility];  Service: Gynecology;  Laterality: N/A;  . Eye surgery Bilateral     Bi lateral cateracts and bi lateral laser    There were no vitals filed for this  visit.      Subjective Assessment - 06/15/15 1319    Subjective Pt reports back pain "comes and goes" but the neck pain seems to be there all the time. States pain better today than last visit, in part she thinks because she has been doing the exercises "a lot"; completing some of both neck and back each day, especially the stretches. Has not purchased a ball for the self-piriformis release but states she has been to use a rolled ball of yarn with some success.   Patient Stated Goals "Make the pain better"   Currently in Pain? Yes   Pain Score 8    Pain Location Back   Pain Orientation Lower;Right   Pain Score 8   Pain Location  Neck   Pain Orientation Left;Lateral          Today's Treatment  TherEx NuStep - lvl 4 x 5'  Manual  B Hamstring, SKTC, and Figure 4 & KTOS piriformis stretches 2x30"  TherEx DKTC with feet on peanut ball 15x3" Bridge with feet on peanut ball 10x3" LTR 12x5" Hooklying Abdominal bracing + Pelvic tilt 15x3" TrA + Hooklying Alternating Hip ABD/ER with blue TB x12 TrA + Hooklying March with blue TB x12 B Sidelying Hip ABD/ER clam with green TB x10 TrA + Dead bug x10          PT Short Term Goals - 06/07/15 1406    PT SHORT TERM GOAL #1   Title Independent with initial HEP by 06/09/15   Status Achieved           PT Long Term Goals - 06/07/15 1405    PT LONG TERM GOAL #1   Title Independent with advanced HEP as indicated by 07/07/15   Status On-going   PT LONG TERM GOAL #2   Title Pt will demonstrate lumbar ROM 50% of normal or greater w/o increased pain by 07/07/15   Status On-going   PT LONG TERM GOAL #3   Title Pt will tolerate sitting for 30 minutes or greater without increased neck, low back or leg pain while watching TV or working on computer by 07/07/15   Status On-going   PT LONG TERM GOAL #4   Title Pt will report ability to perform normal ADL's and light household chores without increased low back or leg pain by 07/07/15   Status On-going   PT LONG TERM GOAL #5   Title Pt will demonstrate increased cervical ROM in all planes by at least 10 degrees (5 degrees for extension) to allow for functional ROM of neck by 07/07/15   Status On-going   PT LONG TERM GOAL #6   Title Pt will consistently demonstrate awareness of neutral neck and shoulder posture by 07/07/15    Status On-going               Plan - 06/15/15 1335    Clinical Impression Statement Pt reporting subjective improvment in overall pain (depsite pain ratings typically averaging 8/10 or greater), stating back pain now only intermittent with neck pain remaining more constant; although still  wanting to focus on low back pain over neck today. Able to tolerate progression of resistance and reps with most exercises. Given pt scheduled to return again tomorrow for 10th visit, will plan to reassess cervical and lumbar flexibility and then focus remainder of session on cervical region unless symptoms indicate otherwise.   PT Treatment/Interventions Patient/family education;Manual techniques;Passive range of motion;Taping;Therapeutic exercise;Therapeutic activities;Functional mobility training;Gait training;Neuromuscular re-education;Moist Heat;Electrical Stimulation;Cryotherapy;Traction;ADLs/Self  Care Home Management   PT Next Visit Plan 10th visit FOTO & G-code; Assess cervical & lumbar flexibility; Manual therapy and modalities PRN to manage pain; Postural training; Progress cervical & lumbar flexibility and stabilization exercises as tolerated and update HEPs as appropriate   Consulted and Agree with Plan of Care Patient      Patient will benefit from skilled therapeutic intervention in order to improve the following deficits and impairments:  Pain, Impaired flexibility, Decreased range of motion, Increased muscle spasms, Decreased strength, Difficulty walking, Decreased activity tolerance, Postural dysfunction, Impaired perceived functional ability, Improper body mechanics  Visit Diagnosis: Bilateral low back pain with right-sided sciatica  Difficulty in walking, not elsewhere classified  Cervicalgia     Problem List Patient Active Problem List   Diagnosis Date Noted  . Pain of upper abdomen 06/06/2015  . Neck pain 04/22/2015  . Side pain 04/05/2015  . Ear pain 02/28/2015  . Abnormal urine 11/21/2014  . 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 Anemia 06/08/2014  . RLS (restless legs syndrome) 11/23/2013  . Diabetic peripheral neuropathy ([place]) 10/29/2013  . Left-sided thoracic back pain 10/06/2013  . Encounter for preventative adult health care exam with  abnormal findings 09/14/2013  . Iron deficiency anemia   . Status post laparoscopy 02/25/2013  . GERD (gastroesophageal reflux disease) 01/09/2013  . Amaurosis fugax of left eye 10/16/2012  . Low back pain 06/03/2012  . Vitamin D deficiency 03/11/2012  . Bilateral hand pain 10/20/2011  . Encounter for long-term (current) use of other medications 10/20/2011  . IBS (irritable bowel syndrome) 08/14/2011  . TIA (transient ischemic attack) 02/10/2011  . Abnormal brain MRI 01/19/2011  . Allergic rhinitis 10/01/2010  . 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 FUNDIC GLAND POLYPS OF STOMACH 03/18/2010  . EPIGASTRIC PAIN chronic 03/18/2010  . ARTHRALGIA 03/17/2009  . SYSTOLIC MURMUR [id number]  . Paroxysmal supraventricular tachycardia ([place]) 01/12/2009  . PLANTAR FASCIITIS 06/08/2008  . CHEST PAIN 05/18/2008  . Gastroparesis 12/18/2007  . HYPERCHOLESTEROLEMIA 06/11/2007  . Diabetes mellitus type 2 in obese ([place]) 09/05/2006  . Essential hypertension 09/05/2006    Percival Spanish, PT, MPT 06/15/2015, 1:59 PM  [facility] [address]  [place], [place], [address] Phone: [phone]   Fax:  [phone]  Name: MAYCI KAMINSKI MRN: [id number] Date of Birth: May 13, 1940

## 2015-06-16 ENCOUNTER — Ambulatory Visit: Payer: Medicare Other | Admitting: Physical Therapy

## 2015-06-16 DIAGNOSIS — M542 Cervicalgia: Secondary | ICD-10-CM | POA: Diagnosis not present

## 2015-06-16 DIAGNOSIS — R262 Difficulty in walking, not elsewhere classified: Secondary | ICD-10-CM | POA: Diagnosis not present

## 2015-06-16 DIAGNOSIS — M5441 Lumbago with sciatica, right side: Secondary | ICD-10-CM | POA: Diagnosis not present

## 2015-06-16 NOTE — Therapy (Signed)
[facility] [address]  [place], [place], [address] Phone: [phone]   Fax:  [phone]  Physical Therapy Treatment  Patient Details  Name: [name] MRN: [id number] Date of Birth: Jan 08, 1941 Referring Provider: [name], MD  Encounter Date: 06/16/2015      PT End of Session - 06/16/15 1155    Visit Number 10   Number of Visits 16   Date for PT Re-Evaluation 07/07/15   PT Start Time 1150   PT Stop Time 1237   PT Time Calculation (min) 47 min   Activity Tolerance Patient tolerated treatment well   Behavior During Therapy [facility] for tasks assessed/performed      Past Medical History  Diagnosis Date  . Gastroparesis   . Pure hypercholesterolemia   . Headache(784.0)   . Unspecified essential hypertension   . Esophageal reflux   . Type II or unspecified type diabetes mellitus without mention of complication, not stated as uncontrolled   . Arthritis     Spinal Osteoarthritis  . Diverticulosis 08/30/2000    Colonoscopy   . Gastric polyp     Fundic Gland  . Anxiety   . Cataract   . Heart murmur     Echocardiogram 2/11: EF 60-65%, mild LAE, grade 1 diastolic dysfunction, aortic valve sclerosis, mean gradient 9 mm of mercury, PASP 34  . Chest pain     Myoview 12/15 no ischemia.  . Carcinoid tumor of stomach   . PSVT (paroxysmal supraventricular tachycardia) ([place])   . Leg swelling     bilateral  . Chronic kidney disease     Left kidney smaller than right kidney  . Cancer ([place])   . PONV (postoperative nausea and vomiting)     pt states only needs small amount of anesthesia  . Stroke ([place])     tia, 2014  . Iron deficiency anemia, unspecified   . Encounter for preventative adult health care exam with abnormal findings 09/14/2013  . Unspecified hereditary and idiopathic peripheral neuropathy 10/29/2013  . Diabetic peripheral neuropathy ([place]) 10/29/2013  . Diabetes mellitus type 2 in obese ([place])  09/05/2006    Qualifier: Diagnosis of  By: Marca Ancona RMA, Lucy    . Anemia 06/08/2014  . 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 Neck pain 04/22/2015    Past Surgical History  Procedure Laterality Date  . Cholecystectomy  1993  . Tonsillectomy    . Colonoscopy  11/11/2010    diverticulosis  . Esophagogastroduodenoscopy  08/29/2010; 09/15/2010    Carcinoid tumor less than 1 cm in July 2012 not seen in August 2012 , gastritis, fundic gland polyps  . Eus  12/15/2010    Procedure: UPPER ENDOSCOPIC ULTRASOUND (EUS) LINEAR;  Surgeon: Owens Loffler, MD;  [facility]: [facility];  Service: Endoscopy;  Laterality: N/A;  . Esophagogastroduodenoscopy  05/16/2011  . Esophagogastroduodenoscopy  06/14/2012  . Dilatation & currettage/hysteroscopy with resectocope N/A 02/25/2013    Procedure: Attempted hysteroscopy with uterine perforation;  Surgeon: Jamey Reas de Berton Lan, MD;  Location: [facility];  Service: Gynecology;  Laterality: N/A;  . Laparoscopy N/A 02/25/2013    Procedure: Cystoscopy and laparoscopy with fulguration of uterine serosa;  Surgeon: Jamey Reas de Berton Lan, MD;  Location: [facility];  Service: Gynecology;  Laterality: N/A;  . Eye surgery Bilateral     Bi lateral cateracts and bi lateral laser    There were no vitals filed for this  visit.      Subjective Assessment - 06/16/15 1152    Subjective Pt states low back pain is better than yesterday but neck is unchanged.   How long can you sit comfortably? 1 hr   Patient Stated Goals "Make the pain better"   Currently in Pain? Yes   Pain Score 7    Pain Location Back   Pain Orientation Lower   Pain Descriptors / Indicators Stabbing   Pain Frequency Intermittent   Pain Score 8   Pain Location Neck   Pain Orientation Left;Lateral   Pain Descriptors / Indicators Sharp   Pain Frequency Constant            [facility] PT Assessment - 06/16/15 1150    Assessment   Medical Diagnosis Low back pain with sciatica;  Neck pain   Onset Date/Surgical Date 04/22/15   Next MD Visit 08/26/15   Observation/Other Assessments   Focus on Therapeutic Outcomes (FOTO)  Lumbar Spine - 52% (48% limitation)   ROM / Strength   AROM / PROM / Strength AROM   AROM   AROM Assessment Site Cervical;Shoulder;Lumbar   Right Shoulder Flexion 141 Degrees   Left Shoulder Extension 129 Degrees   Cervical Flexion 50   Cervical Extension 32   Cervical - Right Side Bend 31   Cervical - Left Side Bend 30   Cervical - Right Rotation 61   Cervical - Left Rotation 58   Lumbar Flexion 50% (hands to top of knees)   Lumbar Extension 40%   Lumbar - Right Side Bend 50% (hand to 2" above knee)   Lumbar - Left Side Bend 50% (hand to 2" above)   Lumbar - Right Rotation 50%   Lumbar - Left Rotation 75%         Today's Treatment  TherEx NuStep - lvl 4 x 7'  ROM check - Cervical, shoulder & lumbar Goal Assessment  Review of postural education  Manual  B UT & levator stretches, emphasis on L SOC STM to B cervical paraspinals, L UT & levator L 1st & 2nd posterior rib mobs  TherEx Seated L Levator scapulae stretch 2x30" Seated B Rhomboid/Middle trap stretch 2x30" B Shoulder Rows with red TB 15x3" B Scapular Retraction + Shoulder Extension to neutral with red TB 15x3"           PT Education - 06/16/15 1244    Education provided Yes   Education Details Review progress to date; Levator scapulae & rhomboid/MT stretches   Person(s) Educated Patient   Methods Explanation;Demonstration;Handout   Comprehension Verbalized understanding;Returned demonstration;Need further instruction          PT Short Term Goals - 06/07/15 1406    PT SHORT TERM GOAL #1   Title Independent with initial HEP by 06/09/15   Status Achieved           PT Long Term Goals - 06/16/15 1209    PT LONG TERM GOAL #1   Title Independent with advanced HEP as indicated by 07/07/15   Status Partially Met  Met for current HEP. Additonal  cervical/upper back stretches added today.   PT LONG TERM GOAL #2   Title Pt will demonstrate lumbar ROM 50% of normal or greater w/o increased pain by 07/07/15   Status Partially Met  Met except extension only 40%   PT LONG TERM GOAL #3   Title Pt will tolerate sitting for 30 minutes or greater without increased neck, low back or leg pain while  watching TV or working on computer by 07/07/15   Status Achieved   PT LONG TERM GOAL #4   Title Pt will report ability to perform normal ADL's and light household chores without increased low back or leg pain by 07/07/15   Status Partially Met  Able to complete ADL's w/o increased pain but still limited with household chores   PT LONG TERM GOAL #5   Title Pt will demonstrate increased cervical ROM in all planes by at least 10 degrees (5 degrees for extension) to allow for functional ROM of neck by 07/07/15   Status Partially Met  Met except extension & R rotation (w/in 1 dg of goal)   PT LONG TERM GOAL #6   Title Pt will consistently demonstrate awareness of neutral neck and shoulder posture by 07/07/15    Status Partially Met  Reports increasing awareness but still requires frequent self-correction               Plan - 07/08/15 1247    Clinical Impression Statement Pt demonstrating good functional progress with PT with improving flexibility and ROM for both cervical and lumbar spine. Pt continues to consistenty report pain levels >/= 7/10 although states low back pain now more intermittent than constant and reporting decreased pain interference in daily activities such as ADL's and light household chores. L lateral neck pain still prevents pt from sleeping on L side and limits heavy household chores such as vacuuming. Overall pt progressing well with POC with all goals at least partially met at present. Given focus on low back yesterday and higher pain reports for neck, therapy focus today concentrated on cervical spine.   PT  Treatment/Interventions Patient/family education;Manual techniques;Passive range of motion;Taping;Therapeutic exercise;Therapeutic activities;Functional mobility training;Gait training;Neuromuscular re-education;Moist Heat;Electrical Stimulation;Cryotherapy;Traction;ADLs/Self Care Home Management   PT Next Visit Plan Manual therapy and modalities PRN to manage pain; Postural training; Progress cervical & lumbar flexibility and stabilization exercises as tolerated and update HEPs as appropriate   Consulted and Agree with Plan of Care Patient      Patient will benefit from skilled therapeutic intervention in order to improve the following deficits and impairments:  Pain, Impaired flexibility, Decreased range of motion, Increased muscle spasms, Decreased strength, Difficulty walking, Decreased activity tolerance, Postural dysfunction, Impaired perceived functional ability, Improper body mechanics  Visit Diagnosis: Bilateral low back pain with right-sided sciatica  Difficulty in walking, not elsewhere classified  Cervicalgia       G-Codes - July 08, 2015 1247    Functional Assessment Tool Used Lumbar Spine FOTO = 52% (48% limitation)   Functional Limitation Changing and maintaining body position   Changing and Maintaining Body Position Current Status (Z6109) At least 40 percent but less than 60 percent impaired, limited or restricted   Changing and Maintaining Body Position Goal Status (U0454) At least 20 percent but less than 40 percent impaired, limited or restricted      Problem List Patient Active Problem List   Diagnosis Date Noted  . Pain of upper abdomen 06/06/2015  . Neck pain 04/22/2015  . Side pain 04/05/2015  . Ear pain 02/28/2015  . Abnormal urine 11/21/2014  . 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 Anemia 06/08/2014  . RLS (restless legs syndrome) 11/23/2013  . Diabetic peripheral neuropathy ([place]) 10/29/2013  . Left-sided thoracic back pain 10/06/2013   . Encounter for preventative adult health care exam with abnormal findings 09/14/2013  . Iron deficiency anemia   . Status post laparoscopy 02/25/2013  . GERD (  gastroesophageal reflux disease) 01/09/2013  . Amaurosis fugax of left eye 10/16/2012  . Low back pain 06/03/2012  . Vitamin D deficiency 03/11/2012  . Bilateral hand pain 10/20/2011  . Encounter for long-term (current) use of other medications 10/20/2011  . IBS (irritable bowel syndrome) 08/14/2011  . TIA (transient ischemic attack) 02/10/2011  . Abnormal brain MRI 01/19/2011  . Allergic rhinitis 10/01/2010  . 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 FUNDIC GLAND POLYPS OF STOMACH 03/18/2010  . EPIGASTRIC PAIN chronic 03/18/2010  . ARTHRALGIA 03/17/2009  . SYSTOLIC MURMUR 29/47/6546  . Paroxysmal supraventricular tachycardia ([place]) 01/12/2009  . PLANTAR FASCIITIS 06/08/2008  . CHEST PAIN 05/18/2008  . Gastroparesis 12/18/2007  . HYPERCHOLESTEROLEMIA 06/11/2007  . Diabetes mellitus type 2 in obese ([place]) 09/05/2006  . Essential hypertension 09/05/2006    Percival Spanish, PT, MPT 06/16/2015, 12:59 PM  [facility] [address]  [place], [place], [address] Phone: [phone]   Fax:  [phone]  Name: [name] MRN: [id number] Date of Birth: May 18, 1940

## 2015-06-17 ENCOUNTER — Ambulatory Visit: Payer: Medicare Other | Admitting: Physical Therapy

## 2015-06-21 ENCOUNTER — Ambulatory Visit: Payer: Medicare Other | Admitting: Physical Therapy

## 2015-06-21 DIAGNOSIS — M542 Cervicalgia: Secondary | ICD-10-CM

## 2015-06-21 DIAGNOSIS — M5441 Lumbago with sciatica, right side: Secondary | ICD-10-CM | POA: Diagnosis not present

## 2015-06-21 DIAGNOSIS — R262 Difficulty in walking, not elsewhere classified: Secondary | ICD-10-CM

## 2015-06-21 NOTE — Therapy (Signed)
[facility] [address]  [place], [place], [address] Phone: [phone]   Fax:  [phone]  Physical Therapy Treatment  Patient Details  Name: [name] MRN: [id number] Date of Birth: 1940/03/18 Referring Provider: [name], MD  Encounter Date: 06/21/2015      PT End of Session - 06/21/15 1320    Visit Number 11   Number of Visits 16   Date for PT Re-Evaluation 07/07/15   PT Start Time 1443   PT Stop Time 1401   PT Time Calculation (min) 46 min   Activity Tolerance Patient tolerated treatment well   Behavior During Therapy [facility] for tasks assessed/performed      Past Medical History  Diagnosis Date  . Gastroparesis   . Pure hypercholesterolemia   . Headache(784.0)   . Unspecified essential hypertension   . Esophageal reflux   . Type II or unspecified type diabetes mellitus without mention of complication, not stated as uncontrolled   . Arthritis     Spinal Osteoarthritis  . Diverticulosis 08/30/2000    Colonoscopy   . Gastric polyp     Fundic Gland  . Anxiety   . Cataract   . Heart murmur     Echocardiogram 2/11: EF 60-65%, mild LAE, grade 1 diastolic dysfunction, aortic valve sclerosis, mean gradient 9 mm of mercury, PASP 34  . Chest pain     Myoview 12/15 no ischemia.  . Carcinoid tumor of stomach   . PSVT (paroxysmal supraventricular tachycardia) ([place])   . Leg swelling     bilateral  . Chronic kidney disease     Left kidney smaller than right kidney  . Cancer ([place])   . PONV (postoperative nausea and vomiting)     pt states only needs small amount of anesthesia  . Stroke ([place])     tia, 2014  . Iron deficiency anemia, unspecified   . Encounter for preventative adult health care exam with abnormal findings 09/14/2013  . Unspecified hereditary and idiopathic peripheral neuropathy 10/29/2013  . Diabetic peripheral neuropathy ([place]) 10/29/2013  . Diabetes mellitus type 2 in obese ([place])  09/05/2006    Qualifier: Diagnosis of  By: Marca Ancona RMA, Lucy    . Anemia 06/08/2014  . 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 Neck pain 04/22/2015    Past Surgical History  Procedure Laterality Date  . Cholecystectomy  1993  . Tonsillectomy    . Colonoscopy  11/11/2010    diverticulosis  . Esophagogastroduodenoscopy  08/29/2010; 09/15/2010    Carcinoid tumor less than 1 cm in July 2012 not seen in August 2012 , gastritis, fundic gland polyps  . Eus  12/15/2010    Procedure: UPPER ENDOSCOPIC ULTRASOUND (EUS) LINEAR;  Surgeon: Owens Loffler, MD;  Location: [facility];  Service: Endoscopy;  Laterality: N/A;  . Esophagogastroduodenoscopy  05/16/2011  . Esophagogastroduodenoscopy  06/14/2012  . Dilatation & currettage/hysteroscopy with resectocope N/A 02/25/2013    Procedure: Attempted hysteroscopy with uterine perforation;  Surgeon: Jamey Reas de Berton Lan, MD;  Location: [facility];  Service: Gynecology;  Laterality: N/A;  . Laparoscopy N/A 02/25/2013    Procedure: Cystoscopy and laparoscopy with fulguration of uterine serosa;  Surgeon: Jamey Reas de Berton Lan, MD;  Location: [facility];  Service: Gynecology;  Laterality: N/A;  . Eye surgery Bilateral     Bi lateral cateracts and bi lateral laser    There were no vitals filed for this  visit.      Subjective Assessment - 06/21/15 1319    Pain Score 7    Pain Location Back   Pain Orientation Lower;Right   Pain Score 7   Pain Location Neck   Pain Orientation Left;Lateral            Today's Treatment  TherEx UBE - lvl 1.5 Fwd/Back x90" each Seated Prayer stretch with green (65 cm) Pball L/C/R 2x20" each  Manual  B Hamstring, SKTC, and Figure 4 & KTOS piriformis stretches 2x30"  TherEx DKTC with feet on peanut ball 15x3" Bridge with feet on peanut ball 15x3" Hooklying Abdominal bracing + Pelvic tilt 15x3" TrA + LTR with lower legs on orange (55 cm) Pball 10x5" TrA + Dead bug x12 TrA +  Hooklying Alternating Hip ABD/ER with blue TB x12 TrA + Hooklying March with blue TB x12 B Sidelying Hip ABD/ER clam with blue TB x10  Manual  STM/R piriformis release in L sidelying            PT Short Term Goals - 06/07/15 1406    PT SHORT TERM GOAL #1   Title Independent with initial HEP by 06/09/15   Status Achieved           PT Long Term Goals - 06/16/15 1209    PT LONG TERM GOAL #1   Title Independent with advanced HEP as indicated by 07/07/15   Status Partially Met  Met for current HEP. Additonal cervical/upper back stretches added today.   PT LONG TERM GOAL #2   Title Pt will demonstrate lumbar ROM 50% of normal or greater w/o increased pain by 07/07/15   Status Partially Met  Met except extension only 40%   PT LONG TERM GOAL #3   Title Pt will tolerate sitting for 30 minutes or greater without increased neck, low back or leg pain while watching TV or working on computer by 07/07/15   Status Achieved   PT LONG TERM GOAL #4   Title Pt will report ability to perform normal ADL's and light household chores without increased low back or leg pain by 07/07/15   Status Partially Met  Able to complete ADL's w/o increased pain but still limited with household chores   PT LONG TERM GOAL #5   Title Pt will demonstrate increased cervical ROM in all planes by at least 10 degrees (5 degrees for extension) to allow for functional ROM of neck by 07/07/15   Status Partially Met  Met except extension & R rotation (w/in 1 dg of goal)   PT LONG TERM GOAL #6   Title Pt will consistently demonstrate awareness of neutral neck and shoulder posture by 07/07/15    Status Partially Met  Reports increasing awareness but still requires frequent self-correction               Plan - 06/21/15 1349    Clinical Impression Statement Therapeutic exercises targeted low back today at pt's request with progression of reps/resistance/complexity as able. Pt reports LBP remains localized to R low  back/SIJ/piriformis and reposnds somewhat to [facility] but pt remains resistant to use of [place] or [place] modalities.   PT Treatment/Interventions Patient/family education;Manual techniques;Passive range of motion;Taping;Therapeutic exercise;Therapeutic activities;Functional mobility training;Gait training;Neuromuscular re-education;Moist Heat;Electrical Stimulation;Cryotherapy;Traction;ADLs/Self Care Home Management   PT Next Visit Plan Manual therapy and modalities PRN to manage pain; Postural training; Progress cervical & lumbar flexibility and stabilization exercises as tolerated and update HEPs as appropriate   Consulted and Agree with Plan  of Care Patient      Patient will benefit from skilled therapeutic intervention in order to improve the following deficits and impairments:  Pain, Impaired flexibility, Decreased range of motion, Increased muscle spasms, Decreased strength, Difficulty walking, Decreased activity tolerance, Postural dysfunction, Impaired perceived functional ability, Improper body mechanics  Visit Diagnosis: Bilateral low back pain with right-sided sciatica  Difficulty in walking, not elsewhere classified  Cervicalgia     Problem List Patient Active Problem List   Diagnosis Date Noted  . Pain of upper abdomen 06/06/2015  . Neck pain 04/22/2015  . Side pain 04/05/2015  . Ear pain 02/28/2015  . Abnormal urine 11/21/2014  . 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 Anemia 06/08/2014  . RLS (restless legs syndrome) 11/23/2013  . Diabetic peripheral neuropathy ([place]) 10/29/2013  . Left-sided thoracic back pain 10/06/2013  . Encounter for preventative adult health care exam with abnormal findings 09/14/2013  . Iron deficiency anemia   . Status post laparoscopy 02/25/2013  . GERD (gastroesophageal reflux disease) 01/09/2013  . Amaurosis fugax of left eye 10/16/2012  . Low back pain 06/03/2012  . Vitamin D deficiency 03/11/2012  . Bilateral hand pain  10/20/2011  . Encounter for long-term (current) use of other medications 10/20/2011  . IBS (irritable bowel syndrome) 08/14/2011  . TIA (transient ischemic attack) 02/10/2011  . Abnormal brain MRI 01/19/2011  . Allergic rhinitis 10/01/2010  . 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 FUNDIC GLAND POLYPS OF STOMACH 03/18/2010  . EPIGASTRIC PAIN chronic 03/18/2010  . ARTHRALGIA 03/17/2009  . SYSTOLIC MURMUR 07/86/7544  . Paroxysmal supraventricular tachycardia ([place]) 01/12/2009  . PLANTAR FASCIITIS 06/08/2008  . CHEST PAIN 05/18/2008  . Gastroparesis 12/18/2007  . HYPERCHOLESTEROLEMIA 06/11/2007  . Diabetes mellitus type 2 in obese ([place]) 09/05/2006  . Essential hypertension 09/05/2006    Percival Spanish, PT, MPT 06/21/2015, 2:06 PM  [facility] [address]  [place], [place], [address] Phone: [phone]   Fax:  [phone]  Name: [name] MRN: [id number] Date of Birth: 1941-01-24

## 2015-06-24 ENCOUNTER — Ambulatory Visit: Payer: Medicare Other | Admitting: Physical Therapy

## 2015-06-29 ENCOUNTER — Ambulatory Visit: Payer: Medicare Other | Admitting: Physical Therapy

## 2015-07-01 ENCOUNTER — Ambulatory Visit: Payer: Medicare Other | Admitting: Physical Therapy

## 2015-07-01 DIAGNOSIS — R262 Difficulty in walking, not elsewhere classified: Secondary | ICD-10-CM

## 2015-07-01 DIAGNOSIS — M5441 Lumbago with sciatica, right side: Secondary | ICD-10-CM | POA: Diagnosis not present

## 2015-07-01 DIAGNOSIS — M542 Cervicalgia: Secondary | ICD-10-CM | POA: Diagnosis not present

## 2015-07-01 NOTE — Therapy (Signed)
[facility] [address]  [place], [place], [address] Phone: [phone]   Fax:  [phone]  Physical Therapy Treatment  Patient Details  Name: [name] MRN: [id number] Date of Birth: [birth year] Referring Provider: [name], MD  Encounter Date: 07/01/2015      PT End of Session - 07/01/15 1333    Visit Number 12   Number of Visits 16   Date for PT Re-Evaluation 07/07/15   PT Start Time 8101   PT Stop Time 1420   PT Time Calculation (min) 62 min   Activity Tolerance Patient tolerated treatment well   Behavior During Therapy [facility] for tasks assessed/performed      Past Medical History  Diagnosis Date  . Gastroparesis   . Pure hypercholesterolemia   . Headache(784.0)   . Unspecified essential hypertension   . Esophageal reflux   . Type II or unspecified type diabetes mellitus without mention of complication, not stated as uncontrolled   . Arthritis     Spinal Osteoarthritis  . Diverticulosis 08/30/2000    Colonoscopy   . Gastric polyp     Fundic Gland  . Anxiety   . Cataract   . Heart murmur     Echocardiogram 2/11: EF 60-65%, mild LAE, grade 1 diastolic dysfunction, aortic valve sclerosis, mean gradient 9 mm of mercury, PASP 34  . Chest pain     Myoview 12/15 no ischemia.  . Carcinoid tumor of stomach   . PSVT (paroxysmal supraventricular tachycardia) ([place])   . Leg swelling     bilateral  . Chronic kidney disease     Left kidney smaller than right kidney  . Cancer ([place])   . PONV (postoperative nausea and vomiting)     pt states only needs small amount of anesthesia  . Stroke ([place])     tia, 2014  . Iron deficiency anemia, unspecified   . Encounter for preventative adult health care exam with abnormal findings 09/14/2013  . Unspecified hereditary and idiopathic peripheral neuropathy 10/29/2013  . Diabetic peripheral neuropathy ([place]) 10/29/2013  . Diabetes mellitus type 2 in obese ([place])  09/05/2006    Qualifier: Diagnosis of  By: Marca Ancona RMA, Lucy    . Anemia 06/08/2014  . 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 Neck pain 04/22/2015    Past Surgical History  Procedure Laterality Date  . Cholecystectomy  1993  . Tonsillectomy    . Colonoscopy  11/11/2010    diverticulosis  . Esophagogastroduodenoscopy  08/29/2010; 09/15/2010    Carcinoid tumor less than 1 cm in July 2012 not seen in August 2012 , gastritis, fundic gland polyps  . Eus  12/15/2010    Procedure: UPPER ENDOSCOPIC ULTRASOUND (EUS) LINEAR;  Surgeon: Owens Loffler, MD;  [facility]: [facility];  Service: Endoscopy;  Laterality: N/A;  . Esophagogastroduodenoscopy  05/16/2011  . Esophagogastroduodenoscopy  06/14/2012  . Dilatation & currettage/hysteroscopy with resectocope N/A 02/25/2013    Procedure: Attempted hysteroscopy with uterine perforation;  Surgeon: Jamey Reas de Berton Lan, MD;  Location: [facility];  Service: Gynecology;  Laterality: N/A;  . Laparoscopy N/A 02/25/2013    Procedure: Cystoscopy and laparoscopy with fulguration of uterine serosa;  Surgeon: Jamey Reas de Berton Lan, MD;  Location: [facility];  Service: Gynecology;  Laterality: N/A;  . Eye surgery Bilateral     Bi lateral cateracts and bi lateral laser    There were no vitals filed for this  visit.      Subjective Assessment - 07/01/15 1323    Subjective Pt reports neck pain less today and feels like she is doing well with her HEP. Back pain is worse, concentrated over tailbone and with R-sided sciatica all the way down to foot.   Currently in Pain? Yes   Pain Score 9    Pain Score 6          Today's Treatment  TherEx UBE - lvl 1.5 Fwd/Back x90" each  Manual  B Hamstring, SKTC, and Figure 4 & KTOS piriformis stretches 2x30" B mod thomas hip flexor stretch x1' STM/stretching to B iliopsoas   TherEx DKTC with feet on peanut ball 15x3" Hooklying Abdominal bracing + Pelvic tilt 15x3" TrA + Hooklying  March with blue TB x12 TrA + Hooklying Alternating Hip ABD/ER with blue TB x12  Mechanical Traction Hooklying L-spine with neutral pull - 50#/25#, 60"/20", 8'          PT Short Term Goals - 06/07/15 1406    PT SHORT TERM GOAL #1   Title Independent with initial HEP by 06/09/15   Status Achieved           PT Long Term Goals - 07/01/15 1936    PT LONG TERM GOAL #1   Title Independent with advanced HEP as indicated by 07/07/15   Status Partially Met  Met for current HEP. Additonal cervical/upper back stretches added today.   PT LONG TERM GOAL #2   Title Pt will demonstrate lumbar ROM 50% of normal or greater w/o increased pain by 07/07/15   Status Partially Met  Met except extension only 40%   PT LONG TERM GOAL #3   Title Pt will tolerate sitting for 30 minutes or greater without increased neck, low back or leg pain while watching TV or working on computer by 07/07/15   Status Achieved   PT LONG TERM GOAL #4   Title Pt will report ability to perform normal ADL's and light household chores without increased low back or leg pain by 07/07/15   Status Partially Met  Able to complete ADL's w/o increased pain but still limited with household chores   PT LONG TERM GOAL #5   Title Pt will demonstrate increased cervical ROM in all planes by at least 10 degrees (5 degrees for extension) to allow for functional ROM of neck by 07/07/15   Status Partially Met  Met except extension & R rotation (w/in 1 dg of goal)   PT LONG TERM GOAL #6   Title Pt will consistently demonstrate awareness of neutral neck and shoulder posture by 07/07/15    Status Partially Met  Reports increasing awareness but still requires frequent self-correction               Plan - 07/01/15 1924    Clinical Impression Statement Pt missed last 2 visits due to illness and emergency home repair and returns to therapy today with 9/10 LBP with R sided sciatica down to her foot. Pt also noting anterior groin/pelvic pain  bilaterally with significant tenderness/tightness noted in B iliopsoas in addition to continued overall tightness in HS, glutes and piriformis, therefore focused on stretching and manual therapy/STM to improve flexibilty and reduce pain with pt reporting benefit. Pt remains resistant to trial of modalities for pain management but able to encourage pt to allow trial of mechanical traction with light pull in effort to attempt to reduce some of sciatic symptoms. Pt reporting neck pain less today  and feels that HEP is helping.   PT Treatment/Interventions Patient/family education;Manual techniques;Passive range of motion;Taping;Therapeutic exercise;Therapeutic activities;Functional mobility training;Gait training;Neuromuscular re-education;Moist Heat;Electrical Stimulation;Cryotherapy;Traction;ADLs/Self Care Home Management   PT Next Visit Plan Assess response to mechanical traction; Manual therapy and modalities PRN +/- mechanical traction if benefit noted to manage pain; Postural training; Progress cervical & lumbar flexibility and stabilization exercises as tolerated and update HEPs as appropriate   Consulted and Agree with Plan of Care Patient      Patient will benefit from skilled therapeutic intervention in order to improve the following deficits and impairments:  Pain, Impaired flexibility, Decreased range of motion, Increased muscle spasms, Decreased strength, Difficulty walking, Decreased activity tolerance, Postural dysfunction, Impaired perceived functional ability, Improper body mechanics  Visit Diagnosis: Bilateral low back pain with right-sided sciatica  Difficulty in walking, not elsewhere classified  Cervicalgia     Problem List Patient Active Problem List   Diagnosis Date Noted  . Pain of upper abdomen 06/06/2015  . Neck pain 04/22/2015  . Side pain 04/05/2015  . Ear pain 02/28/2015  . Abnormal urine 11/21/2014  . 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  Anemia 06/08/2014  . RLS (restless legs syndrome) 11/23/2013  . Diabetic peripheral neuropathy ([place]) 10/29/2013  . Left-sided thoracic back pain 10/06/2013  . Encounter for preventative adult health care exam with abnormal findings 09/14/2013  . Iron deficiency anemia   . Status post laparoscopy 02/25/2013  . GERD (gastroesophageal reflux disease) 01/09/2013  . Amaurosis fugax of left eye 10/16/2012  . Low back pain 06/03/2012  . Vitamin D deficiency 03/11/2012  . Bilateral hand pain 10/20/2011  . Encounter for long-term (current) use of other medications 10/20/2011  . IBS (irritable bowel syndrome) 08/14/2011  . TIA (transient ischemic attack) 02/10/2011  . Abnormal brain MRI 01/19/2011  . Allergic rhinitis 10/01/2010  . 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 FUNDIC GLAND POLYPS OF STOMACH 03/18/2010  . EPIGASTRIC PAIN chronic 03/18/2010  . ARTHRALGIA 03/17/2009  . SYSTOLIC MURMUR 19/47/1252  . Paroxysmal supraventricular tachycardia ([place]) 01/12/2009  . PLANTAR FASCIITIS 06/08/2008  . CHEST PAIN 05/18/2008  . Gastroparesis 12/18/2007  . HYPERCHOLESTEROLEMIA 06/11/2007  . Diabetes mellitus type 2 in obese ([place]) 09/05/2006  . Essential hypertension 09/05/2006    Percival Spanish, PT, MPT 07/01/2015, 7:39 PM  [facility] [address]  [place], [place], [address] Phone: [phone]   Fax:  [phone]  Name: ERNESHA RAMONE MRN: [id number] Date of Birth: [birth year]

## 2015-07-06 ENCOUNTER — Ambulatory Visit: Payer: Medicare Other | Admitting: Physical Therapy

## 2015-07-06 DIAGNOSIS — M5441 Lumbago with sciatica, right side: Secondary | ICD-10-CM | POA: Diagnosis not present

## 2015-07-06 DIAGNOSIS — M542 Cervicalgia: Secondary | ICD-10-CM

## 2015-07-06 DIAGNOSIS — R262 Difficulty in walking, not elsewhere classified: Secondary | ICD-10-CM

## 2015-07-06 NOTE — Therapy (Signed)
[facility] [address]  [place], [place], [address] Phone: [phone]   Fax:  [phone]  Physical Therapy Treatment  Patient Details  Name: [name] MRN: [id number] Date of Birth: [birth year] Referring Provider: [name], MD  Encounter Date: 07/06/2015      PT End of Session - 07/06/15 1320    Visit Number 13   Number of Visits 16   Date for PT Re-Evaluation 07/07/15   PT Start Time 5498   PT Stop Time 1401   PT Time Calculation (min) 44 min   Activity Tolerance Patient tolerated treatment well;Other (comment)  Unable to tolerate mechanical traction today due to anxiety   Behavior During Therapy [facility] for tasks assessed/performed;Anxious      Past Medical History  Diagnosis Date  . Gastroparesis   . Pure hypercholesterolemia   . Headache(784.0)   . Unspecified essential hypertension   . Esophageal reflux   . Type II or unspecified type diabetes mellitus without mention of complication, not stated as uncontrolled   . Arthritis     Spinal Osteoarthritis  . Diverticulosis 08/30/2000    Colonoscopy   . Gastric polyp     Fundic Gland  . Anxiety   . Cataract   . Heart murmur     Echocardiogram 2/11: EF 60-65%, mild LAE, grade 1 diastolic dysfunction, aortic valve sclerosis, mean gradient 9 mm of mercury, PASP 34  . Chest pain     Myoview 12/15 no ischemia.  . Carcinoid tumor of stomach   . PSVT (paroxysmal supraventricular tachycardia) ([place])   . Leg swelling     bilateral  . Chronic kidney disease     Left kidney smaller than right kidney  . Cancer ([place])   . PONV (postoperative nausea and vomiting)     pt states only needs small amount of anesthesia  . Stroke ([place])     tia, 2014  . Iron deficiency anemia, unspecified   . Encounter for preventative adult health care exam with abnormal findings 09/14/2013  . Unspecified hereditary and idiopathic peripheral neuropathy 10/29/2013  .  Diabetic peripheral neuropathy ([place]) 10/29/2013  . Diabetes mellitus type 2 in obese ([place]) 09/05/2006    Qualifier: Diagnosis of  By: Marca Ancona RMA, Lucy    . Anemia 06/08/2014  . 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 Neck pain 04/22/2015    Past Surgical History  Procedure Laterality Date  . Cholecystectomy  1993  . Tonsillectomy    . Colonoscopy  11/11/2010    diverticulosis  . Esophagogastroduodenoscopy  08/29/2010; 09/15/2010    Carcinoid tumor less than 1 cm in July 2012 not seen in August 2012 , gastritis, fundic gland polyps  . Eus  12/15/2010    Procedure: UPPER ENDOSCOPIC ULTRASOUND (EUS) LINEAR;  Surgeon: Owens Loffler, MD;  Location: [facility];  Service: Endoscopy;  Laterality: N/A;  . Esophagogastroduodenoscopy  05/16/2011  . Esophagogastroduodenoscopy  06/14/2012  . Dilatation & currettage/hysteroscopy with resectocope N/A 02/25/2013    Procedure: Attempted hysteroscopy with uterine perforation;  Surgeon: Jamey Reas de Berton Lan, MD;  Location: [facility];  Service: Gynecology;  Laterality: N/A;  . Laparoscopy N/A 02/25/2013    Procedure: Cystoscopy and laparoscopy with fulguration of uterine serosa;  Surgeon: Jamey Reas de Berton Lan, MD;  Location: [facility];  Service: Gynecology;  Laterality: N/A;  . Eye surgery Bilateral     Bi lateral cateracts and bi lateral  laser    There were no vitals filed for this visit.      Subjective Assessment - 07/06/15 1322    Subjective Pt reports pain is better overall today, both in the neck and back. Feels like (lumbar) traction from last visit helped. Pt would like to make up 2 missed visits.   Patient Stated Goals "Make the pain better"   Currently in Pain? Yes   Pain Score --  7-8/10   Pain Location Back   Pain Orientation Lower;Right   Pain Descriptors / Indicators Aching   Pain Radiating Towards R buttock   Pain Score --  7-8/10   Pain Location Neck   Pain Orientation Left;Lateral;Lower   Pain  Descriptors / Indicators Throbbing           Today's Treatment  TherEx NuStep - lvl 4 x 5'  Manual  B Hamstring, SKTC, and Figure 4 & KTOS piriformis stretches 2x30" B sidelying hip flexor and RF stretches x1' STM/stretching to B iliopsoas   TherEx LTR 10x5" DKTC with feet on peanut ball 15x3" Hooklying Abdominal bracing + Pelvic tilt 15x3" TrA + Dead bug x12 TrA + Hooklying 05/24/2022 with blue TB x12 TrA + Hooklying Alternating Hip ABD/ER with blue TB x12  Mechanical Traction Hooklying L-spine with neutral pull - Pt set up for 50#/25#, 60"/20", 10'; but pt asked not to start traction due to anxiety.            PT Short Term Goals - 06/07/15 1406    PT SHORT TERM GOAL #1   Title Independent with initial HEP by 06/09/15   Status Achieved           PT Long Term Goals - 07/01/15 1936    PT LONG TERM GOAL #1   Title Independent with advanced HEP as indicated by 07/07/15   Status Partially Met  Met for current HEP. Additonal cervical/upper back stretches added today.   PT LONG TERM GOAL #2   Title Pt will demonstrate lumbar ROM 50% of normal or greater w/o increased pain by 07/07/15   Status Partially Met  Met except extension only 40%   PT LONG TERM GOAL #3   Title Pt will tolerate sitting for 30 minutes or greater without increased neck, low back or leg pain while watching TV or working on computer by 07/07/15   Status Achieved   PT LONG TERM GOAL #4   Title Pt will report ability to perform normal ADL's and light household chores without increased low back or leg pain by 07/07/15   Status Partially Met  Able to complete ADL's w/o increased pain but still limited with household chores   PT LONG TERM GOAL #5   Title Pt will demonstrate increased cervical ROM in all planes by at least 10 degrees (5 degrees for extension) to allow for functional ROM of neck by 07/07/15   Status Partially Met  Met except extension & R rotation (w/in 1 dg of goal)   PT LONG TERM GOAL  #6   Title Pt will consistently demonstrate awareness of neutral neck and shoulder posture by 07/07/15    Status Partially Met  Reports increasing awareness but still requires frequent self-correction               Plan - 07/06/15 1405    Clinical Impression Statement Pt reporting pain "much better" after traction last visit but still rating both neck and low back pain at 7-8/10. Planned to try traction again  today and pt in agreement, but she became too anxious once set-up in traction harness and unable to proceed with treatment. Pt wanting to make up for 2 missed visits but uncertain if she wants to continue beyond that, and will let PT know at recert tomorrow.   PT Treatment/Interventions Patient/family education;Manual techniques;Passive range of motion;Taping;Therapeutic exercise;Therapeutic activities;Functional mobility training;Gait training;Neuromuscular re-education;Moist Heat;Electrical Stimulation;Cryotherapy;Traction;ADLs/Self Care Home Management   PT Next Visit Plan Manual therapy and modalities PRN +/- mechanical traction if benefit noted to manage pain; Postural training; Progress cervical & lumbar flexibility and stabilization exercises as tolerated and update HEPs as appropriate   Consulted and Agree with Plan of Care Patient      Patient will benefit from skilled therapeutic intervention in order to improve the following deficits and impairments:  Pain, Impaired flexibility, Decreased range of motion, Increased muscle spasms, Decreased strength, Difficulty walking, Decreased activity tolerance, Postural dysfunction, Impaired perceived functional ability, Improper body mechanics  Visit Diagnosis: Bilateral low back pain with right-sided sciatica  Difficulty in walking, not elsewhere classified  Cervicalgia     Problem List Patient Active Problem List   Diagnosis Date Noted  . Pain of upper abdomen 06/06/2015  . Neck pain 04/22/2015  . Side pain 04/05/2015  .  Ear pain 02/28/2015  . Abnormal urine 11/21/2014  . 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 Anemia 06/08/2014  . RLS (restless legs syndrome) 11/23/2013  . Diabetic peripheral neuropathy ([place]) 10/29/2013  . Left-sided thoracic back pain 10/06/2013  . Encounter for preventative adult health care exam with abnormal findings 09/14/2013  . Iron deficiency anemia   . Status post laparoscopy 02/25/2013  . GERD (gastroesophageal reflux disease) 01/09/2013  . Amaurosis fugax of left eye 10/16/2012  . Low back pain 06/03/2012  . Vitamin D deficiency 03/11/2012  . Bilateral hand pain 10/20/2011  . Encounter for long-term (current) use of other medications 10/20/2011  . IBS (irritable bowel syndrome) 08/14/2011  . TIA (transient ischemic attack) 02/10/2011  . Abnormal brain MRI 01/19/2011  . Allergic rhinitis 10/01/2010  . 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 FUNDIC GLAND POLYPS OF STOMACH 03/18/2010  . EPIGASTRIC PAIN chronic 03/18/2010  . ARTHRALGIA 03/17/2009  . SYSTOLIC MURMUR 01/29/8249  . Paroxysmal supraventricular tachycardia ([place]) 01/12/2009  . PLANTAR FASCIITIS 06/08/2008  . CHEST PAIN 05/18/2008  . Gastroparesis 12/18/2007  . HYPERCHOLESTEROLEMIA 06/11/2007  . Diabetes mellitus type 2 in obese ([place]) 09/05/2006  . Essential hypertension 09/05/2006    Percival Spanish, PT, MPT 07/06/2015, 2:10 PM  [facility] [address]  [place], [place], [address] Phone: [phone]   Fax:  [phone]  Name: DEMETRI KERMAN MRN: [id number] Date of Birth: [birth year]

## 2015-07-07 ENCOUNTER — Ambulatory Visit: Payer: Medicare Other | Admitting: Physical Therapy

## 2015-07-07 DIAGNOSIS — R262 Difficulty in walking, not elsewhere classified: Secondary | ICD-10-CM

## 2015-07-07 DIAGNOSIS — M5441 Lumbago with sciatica, right side: Secondary | ICD-10-CM

## 2015-07-07 DIAGNOSIS — M542 Cervicalgia: Secondary | ICD-10-CM

## 2015-07-07 NOTE — Therapy (Signed)
[facility] [address]  [place], [place], [address] Phone: [phone]   Fax:  [phone]  Physical Therapy Treatment  Patient Details  Name: [name] MRN: [id number] Date of Birth: 11-23-1940 Referring Provider: [name], MD  Encounter Date: 07/07/2015      PT End of Session - 07/07/15 1200    Visit Number 14   Number of Visits 22   Date for PT Re-Evaluation 09/03/15   PT Start Time 1151   PT Stop Time 1230   PT Time Calculation (min) 39 min   Activity Tolerance Patient tolerated treatment well   Behavior During Therapy [facility] for tasks assessed/performed      Past Medical History  Diagnosis Date  . Gastroparesis   . Pure hypercholesterolemia   . Headache(784.0)   . Unspecified essential hypertension   . Esophageal reflux   . Type II or unspecified type diabetes mellitus without mention of complication, not stated as uncontrolled   . Arthritis     Spinal Osteoarthritis  . Diverticulosis 08/30/2000    Colonoscopy   . Gastric polyp     Fundic Gland  . Anxiety   . Cataract   . Heart murmur     Echocardiogram 2/11: EF 60-65%, mild LAE, grade 1 diastolic dysfunction, aortic valve sclerosis, mean gradient 9 mm of mercury, PASP 34  . Chest pain     Myoview 12/15 no ischemia.  . Carcinoid tumor of stomach   . PSVT (paroxysmal supraventricular tachycardia) ([place])   . Leg swelling     bilateral  . Chronic kidney disease     Left kidney smaller than right kidney  . Cancer ([place])   . PONV (postoperative nausea and vomiting)     pt states only needs small amount of anesthesia  . Stroke ([place])     tia, 2014  . Iron deficiency anemia, unspecified   . Encounter for preventative adult health care exam with abnormal findings 09/14/2013  . Unspecified hereditary and idiopathic peripheral neuropathy 10/29/2013  . Diabetic peripheral neuropathy ([place]) 10/29/2013  . Diabetes mellitus type 2 in obese ([place])  09/05/2006    Qualifier: Diagnosis of  By: Marca Ancona RMA, Lucy    . Anemia 06/08/2014  . 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 Neck pain 04/22/2015    Past Surgical History  Procedure Laterality Date  . Cholecystectomy  1993  . Tonsillectomy    . Colonoscopy  11/11/2010    diverticulosis  . Esophagogastroduodenoscopy  08/29/2010; 09/15/2010    Carcinoid tumor less than 1 cm in July 2012 not seen in August 2012 , gastritis, fundic gland polyps  . Eus  12/15/2010    Procedure: UPPER ENDOSCOPIC ULTRASOUND (EUS) LINEAR;  Surgeon: Owens Loffler, MD;  [facility]: [facility];  Service: Endoscopy;  Laterality: N/A;  . Esophagogastroduodenoscopy  05/16/2011  . Esophagogastroduodenoscopy  06/14/2012  . Dilatation & currettage/hysteroscopy with resectocope N/A 02/25/2013    Procedure: Attempted hysteroscopy with uterine perforation;  Surgeon: Jamey Reas de Berton Lan, MD;  Location: [facility];  Service: Gynecology;  Laterality: N/A;  . Laparoscopy N/A 02/25/2013    Procedure: Cystoscopy and laparoscopy with fulguration of uterine serosa;  Surgeon: Jamey Reas de Berton Lan, MD;  Location: [facility];  Service: Gynecology;  Laterality: N/A;  . Eye surgery Bilateral     Bi lateral cateracts and bi lateral laser    There were no vitals filed for this  visit.      Subjective Assessment - 07/07/15 1158    Currently in Pain? Yes   Pain Score 8    Pain Location Back   Pain Orientation Lower;Right   Pain Descriptors / Indicators Aching   Pain Radiating Towards R buttock   Pain Score 8   Pain Location Neck   Pain Orientation Left;Lateral;Lower   Pain Descriptors / Indicators Discomfort            [facility] PT Assessment - 07/07/15 1151    Assessment   Medical Diagnosis Low back pain with sciatica; Neck pain   Referring Provider [name], MD   Onset Date/Surgical Date 04/22/15   Next MD Visit 08/26/15   ROM / Strength   AROM / PROM / Strength AROM;Strength   AROM    AROM Assessment Site Cervical;Shoulder;Lumbar   Right Shoulder Flexion 141 Degrees   Left Shoulder Flexion 142 Degrees   Cervical Flexion 52   Cervical Extension 40   Cervical - Right Side Bend 30   Cervical - Left Side Bend 30   Cervical - Right Rotation 62   Cervical - Left Rotation 60   Lumbar Flexion 75% (hands to mid shins)    Lumbar Extension 50%   Lumbar - Right Side Bend 50% (hand to 1" above knee)   Lumbar - Left Side Bend 50% (hand to 1" above knee)   Lumbar - Right Rotation 60%   Lumbar - Left Rotation 80%   Strength   Overall Strength Comments B Shoulder grossly 4+/5 except ER 4/5 (no pain)   Strength Assessment Site Shoulder;Hip   Right/Left Hip Right;Left   Right Hip Flexion 4-/5   Right Hip Extension 4-/5   Right Hip ABduction 4+/5   Right Hip ADduction 4/5   Left Hip Flexion 4-/5   Left Hip Extension 4-/5   Left Hip ABduction 4+/5   Left Hip ADduction 4/5           Today's Treatment  TherEx NuStep - lvl 4 x 5'  ROM & MMT assessment as above  Goal assessment  Recert  Discussion with pt regarding plan for remaining visits and transition to HEP with probable 30 day hold           PT Short Term Goals - 06/07/15 1406    PT SHORT TERM GOAL #1   Title Independent with initial HEP by 06/09/15   Status Achieved           PT Long Term Goals - 07/07/15 1219    PT LONG TERM GOAL #1   Title Independent with advanced HEP as indicated by 08/13/15   Status Partially Met  Met for current HEP. Final review/update pending.   PT LONG TERM GOAL #2   Title Pt will demonstrate lumbar ROM 50% of normal or greater w/o increased pain by 07/07/15   Status Achieved   PT LONG TERM GOAL #3   Title Pt will tolerate sitting for 30 minutes or greater without increased neck, low back or leg pain while watching TV or working on computer by 07/07/15   Status Achieved   PT LONG TERM GOAL #4   Title Pt will report ability to perform normal ADL's and light household  chores without increased low back or leg pain by 08/13/15   Status Partially Met  Able to complete ADL's w/o increased pain but still limited with household chores such as vacuuming & dusting low surfaces   PT LONG TERM GOAL #  5   Title Pt will demonstrate increased cervical ROM in all planes by at least 10 degrees (5 degrees for extension) to allow for functional ROM of neck by 07/07/15   Status Achieved   PT LONG TERM GOAL #6   Title Pt will consistently demonstrate awareness of neutral neck and shoulder posture by 08/13/15    Status Partially Met  Reports increasing awareness but still requires self-correction 20% of time               Plan - 07/07/15 1243    Clinical Impression Statement Pt feeling "much improved" since start of therapy but continues to rate pain typically 7/10 or higher (8/10 in both neck and low back today). Pt has demonstrated significant improvement in cervical, lumbar and B shoulder ROM with most motions currently [facility] and no pain reported during ROM assessment. Pt with improving postural awareness and routinely presents to PT with impored posture but reports continued need for self-correction ~20% of time at home, especially when sitting at computer or watching TV. Pt reporting no pain interference with ADL's and most household chores other than vacuuming and dusting. LTG's for cervical & lumbar ROM along with sitting tolernace; while remining goals at least partially met. Pt would like to make up for 2 missed visits to allow for review/update of neck and low back HEP's and then would like to try going on hold for PT for 30 days to see how she fares on her own with a home program, therefore recommend recert to cover missed visits and possible extension of POC pending response to transition to HEP. Pt again deferring mechancial lumbar traction today due to anxiety despite reported positive response to initial treatment.   PT Frequency --  1-2x/wk per pt preference   PT  Duration --  up to 4 wks   PT Treatment/Interventions Patient/family education;Manual techniques;Passive range of motion;Taping;Therapeutic exercise;Therapeutic activities;Functional mobility training;Gait training;Neuromuscular re-education;Moist Heat;Electrical Stimulation;Cryotherapy;Traction;ADLs/Self Care Home Management   PT Next Visit Plan Review postural training/awareness; Review/update cervical & lumbar flexibility and stabilization HEPs as appropriate; Manual therapy and modalities PRN +/- mechanical traction if benefit noted to manage pain   Consulted and Agree with Plan of Care Patient      Patient will benefit from skilled therapeutic intervention in order to improve the following deficits and impairments:  Pain, Impaired flexibility, Decreased range of motion, Increased muscle spasms, Decreased strength, Difficulty walking, Decreased activity tolerance, Postural dysfunction, Impaired perceived functional ability, Improper body mechanics  Visit Diagnosis: Bilateral low back pain with right-sided sciatica  Difficulty in walking, not elsewhere classified  Cervicalgia     Problem List Patient Active Problem List   Diagnosis Date Noted  . Pain of upper abdomen 06/06/2015  . Neck pain 04/22/2015  . Side pain 04/05/2015  . Ear pain 02/28/2015  . Abnormal urine 11/21/2014  . 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 Anemia 06/08/2014  . RLS (restless legs syndrome) 11/23/2013  . Diabetic peripheral neuropathy ([place]) 10/29/2013  . Left-sided thoracic back pain 10/06/2013  . Encounter for preventative adult health care exam with abnormal findings 09/14/2013  . Iron deficiency anemia   . Status post laparoscopy 02/25/2013  . GERD (gastroesophageal reflux disease) 01/09/2013  . Amaurosis fugax of left eye 10/16/2012  . Low back pain 06/03/2012  . Vitamin D deficiency 03/11/2012  . Bilateral hand pain 10/20/2011  . Encounter for long-term (current) use of  other medications 10/20/2011  . IBS (irritable bowel syndrome) 08/14/2011  .  TIA (transient ischemic attack) 02/10/2011  . Abnormal brain MRI 01/19/2011  . Allergic rhinitis 10/01/2010  . 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 FUNDIC GLAND POLYPS OF STOMACH 03/18/2010  . EPIGASTRIC PAIN chronic 03/18/2010  . ARTHRALGIA 03/17/2009  . SYSTOLIC MURMUR 41/96/2229  . Paroxysmal supraventricular tachycardia ([place]) 01/12/2009  . PLANTAR FASCIITIS 06/08/2008  . CHEST PAIN 05/18/2008  . Gastroparesis 12/18/2007  . HYPERCHOLESTEROLEMIA 06/11/2007  . Diabetes mellitus type 2 in obese ([place]) 09/05/2006  . Essential hypertension 09/05/2006    Percival Spanish, PT, MPT  07/07/2015, 1:11 PM  [facility] [address]  [place], [place], [address] Phone: [phone]   Fax:  [phone]  Name: [name] MRN: [id number] Date of Birth: [birth year]

## 2015-07-08 ENCOUNTER — Other Ambulatory Visit (HOSPITAL_BASED_OUTPATIENT_CLINIC_OR_DEPARTMENT_OTHER): Payer: Medicare Other

## 2015-07-08 ENCOUNTER — Encounter: Payer: Self-pay | Admitting: Hematology & Oncology

## 2015-07-08 ENCOUNTER — Ambulatory Visit (HOSPITAL_BASED_OUTPATIENT_CLINIC_OR_DEPARTMENT_OTHER): Payer: Medicare Other | Admitting: Hematology & Oncology

## 2015-07-08 VITALS — BP 131/63 | HR 65 | Temp 97.8°F | Resp 16 | Ht 61.0 in | Wt 175.0 lb

## 2015-07-08 DIAGNOSIS — K909 Intestinal malabsorption, unspecified: Secondary | ICD-10-CM

## 2015-07-08 DIAGNOSIS — D509 Iron deficiency anemia, unspecified: Secondary | ICD-10-CM

## 2015-07-08 LAB — CBC WITH DIFFERENTIAL (CANCER CENTER ONLY)
BASO#: 0 10*3/uL (ref 0.0–0.2)
BASO%: 0.4 % (ref 0.0–2.0)
EOS ABS: 0.1 10*3/uL (ref 0.0–0.5)
EOS%: 1.3 % (ref 0.0–7.0)
HEMATOCRIT: 37.1 % (ref 34.8–46.6)
HGB: 13 g/dL (ref 11.6–15.9)
LYMPH#: 2.4 10*3/uL (ref 0.9–3.3)
LYMPH%: 33.1 % (ref 14.0–48.0)
MCH: 30 pg (ref 26.0–34.0)
MCHC: 35 g/dL (ref 32.0–36.0)
MCV: 86 fL (ref 81–101)
MONO#: 0.6 10*3/uL (ref 0.1–0.9)
MONO%: 8 % (ref 0.0–13.0)
NEUT#: 4.1 10*3/uL (ref 1.5–6.5)
NEUT%: 57.2 % (ref 39.6–80.0)
Platelets: 234 10*3/uL (ref 145–400)
RBC: 4.33 10*6/uL (ref 3.70–5.32)
RDW: 11.6 % (ref 11.1–15.7)
WBC: 7.1 10*3/uL (ref 3.9–10.0)

## 2015-07-08 LAB — CHCC SATELLITE - SMEAR

## 2015-07-08 LAB — COMPREHENSIVE METABOLIC PANEL
ALT: 30 U/L (ref 0–55)
AST: 24 U/L (ref 5–34)
Albumin: 3.9 g/dL (ref 3.5–5.0)
Alkaline Phosphatase: 62 U/L (ref 40–150)
Anion Gap: 8 mEq/L (ref 3–11)
BUN: 15.5 mg/dL (ref 7.0–26.0)
CO2: 25 mEq/L (ref 22–29)
Calcium: 10 mg/dL (ref 8.4–10.4)
Chloride: 101 mEq/L (ref 98–109)
Creatinine: 0.8 mg/dL (ref 0.6–1.1)
EGFR: 75 mL/min/{1.73_m2} — ABNORMAL LOW (ref >90)
Glucose: 120 mg/dl (ref 70–140)
Potassium: 4.4 mEq/L (ref 3.5–5.1)
Sodium: 134 mEq/L — ABNORMAL LOW (ref 136–145)
Total Bilirubin: 0.64 mg/dL (ref 0.20–1.20)
Total Protein: 7.4 g/dL (ref 6.4–8.3)

## 2015-07-08 NOTE — Progress Notes (Signed)
Hematology and Oncology Follow Up Visit  [name] Sunshine:[id number] 1940-08-12 75 y.o. 07/08/2015   Principle Diagnosis:   Iron deficiency anemia secondary to malabsorption  Current Therapy:    IV iron-patient received 1 dose back in January 2017     Interim History:  [name] is back for follow-up. She is  looking fairly good. She has had no problems since we last saw her. She's had no increase in fatigue or weakness. Her blood sugars have been doing pretty well. She's had her last hemoglobin A1c was 6.4.   She's had no obvious bleeding or bruising.   She's had no rashes. She's had no leg swelling. There's not been any change in medications.   She has had no weight loss or weight gain.   We saw her back in her work, her ferritin was 342 and iron saturation of 33%. For her, that is excellent. .    Overall, her performance status is ECOG 1.  Medications:  Current outpatient prescriptions:  .  acetaminophen (TYLENOL) 500 MG tablet, Take 2 tablets (1,000 mg total) by mouth every 6 (six) hours as needed for moderate pain., Disp: 30 tablet, Rfl: 0 .  ALPRAZolam (XANAX) 0.25 MG tablet, Take 1 tablet (0.25 mg total) by mouth 2 (two) times daily as needed for anxiety., Disp: 30 tablet, Rfl: 1 .  amLODipine (NORVASC) 5 MG tablet, Take 1 tablet by mouth  daily, Disp: 90 tablet, Rfl: 1 .  aspirin 81 MG tablet, Take 81 mg by mouth daily., Disp: , Rfl:  .  calcium-vitamin D (OSCAL WITH D) 500-200 MG-UNIT per tablet, Take 1 tablet by mouth., Disp: , Rfl:  .  cholecalciferol (VITAMIN D) 1000 UNITS tablet, Take 1,000 Units by mouth daily.  , Disp: , Rfl:  .  dicyclomine (BENTYL) 10 MG capsule, Take 1 capsule (10 mg total) by mouth 4 (four) times daily as needed., Disp: 120 capsule, Rfl: 6 .  DIOVAN 320 MG tablet, Take 1 tablet by mouth  daily, Disp: 90 tablet, Rfl: 1 .  esomeprazole (NEXIUM) 40 MG capsule, Take 1 capsule (40 mg total) by mouth daily before breakfast., Disp: 30 capsule,  Rfl: 11 .  FOLBIC 2.5-25-2 MG TABS tablet, TAKE 1 TABLET BY MOUTH EVERY DAY, Disp: 90 tablet, Rfl: 3 .  glucose blood test strip, Take once daily and as needed.  DX E11.9, Disp: 100 each, Rfl: 3 .  Lancets MISC, Use as instructed once daily to check blood sugar. DX E11.9 (Patient taking differently: Use as instructed twice daily to check blood sugar. DX E11.9), Disp: 200 each, Rfl: 2 .  metFORMIN (GLUCOPHAGE-XR) 500 MG 24 hr tablet, Take 1 tablet by mouth 3  times daily with meals, Disp: 270 tablet, Rfl: 1 .  metoCLOPramide (REGLAN) 5 MG tablet, Take 1 tablet by mouth two  times daily, Disp: 180 tablet, Rfl: 1 .  metoprolol (LOPRESSOR) 50 MG tablet, Take 1 tablet by mouth two  times daily, Disp: 180 tablet, Rfl: 1 .  Multiple Vitamin (MULTIVITAMIN) tablet, Take 1 tablet by mouth daily.  , Disp: , Rfl:  .  ondansetron (ZOFRAN) 4 MG tablet, Take 4 mg by mouth every 4 (four) hours as needed. For nausea  , Disp: , Rfl:  .  potassium chloride (K-DUR,KLOR-CON) 10 MEQ tablet, Take 1 tablet by mouth  daily, Disp: 60 tablet, Rfl: 2 .  rosuvastatin (CRESTOR) 10 MG tablet, Take 0.5 tablets (5 mg total) by mouth daily., Disp: 45 tablet, Rfl: 1 .  vitamin E 400 UNIT capsule, Take 400 Units by mouth daily., Disp: , Rfl:  .  zoster vaccine live, PF, (ZOSTAVAX) 29562 UNT/0.65ML injection, Inject 19,400 Units into the skin once., Disp: 1 each, Rfl: 0  Allergies:  Allergies  Allergen Reactions  . Tramadol     Dizziness     Past Medical History, Surgical history, Social history, and Family History were reviewed and updated.  Review of Systems: As above  Physical Exam:  height is 5\' 1"  (1.549 m) and weight is 175 lb (79.379 kg). Her oral temperature is 97.8 F (36.6 C). Her blood pressure is 131/63 and her pulse is 65. Her respiration is 16.   Wt Readings from Last 3 Encounters:  07/08/15 175 lb (79.379 kg)  05/25/15 177 lb (80.287 kg)  05/14/15 173 lb (78.472 kg)     Well-developed well-nourished  white female in no obvious distress. Head and neck exam shows no ocular or oral lesions. There are no palpable cervical or supraclavicular lymph nodes. Lungs are clear. Cardiac exam regular rate and rhythm with no murmurs, rubs or bruits. Abdomen is soft. She has good bowel sounds. There is no fluid wave. There is no palpable liver or spleen tip. Back exam shows no tenderness over the spine, ribs or hips. Extremities shows no clubbing, cyanosis or edema. Skin exam shows no rashes, ecchymosis or petechia. Neurological exam shows no focal neurological deficits.  Lab Results  Component Value Date   WBC 7.1 07/08/2015   HGB 13.0 07/08/2015   HCT 37.1 07/08/2015   MCV 86 07/08/2015   PLT 234 07/08/2015     Chemistry      Component Value Date/Time   NA 136 05/19/2015 0903   K 3.9 05/19/2015 0903   CL 100 05/19/2015 0903   CO2 28 05/19/2015 0903   BUN 12 05/19/2015 0903   CREATININE 0.65 05/19/2015 0903   CREATININE 0.71 05/04/2014 0829      Component Value Date/Time   CALCIUM 10.0 05/19/2015 0903   ALKPHOS 53 05/19/2015 0903   AST 21 05/19/2015 0903   ALT 27 05/19/2015 0903   BILITOT 0.7 05/19/2015 0903         Impression and Plan: [name] is 75 year old white female. She has iron deficiency. I suspect that this is secondary to malabsorption. She's had stools tested which were negative for blood. I think because of her diabetes, she pod is not absorb all that well.  I looked at her blood and the microscope. It looked pretty good. I do not see much in way of hypochromic or microcytic red blood cells. Her MCV was stable. We will plan to get her back in another 4 months.   She has a very strong faith. I am very much impressed by this.    Volanda Napoleon, MD 6/1/20171:07 PM.

## 2015-07-09 LAB — FERRITIN: FERRITIN: 228 ng/mL (ref 9–269)

## 2015-07-09 LAB — RETICULOCYTES: Reticulocyte Count: 1.2 % (ref 0.6–2.6)

## 2015-07-12 ENCOUNTER — Telehealth: Payer: Self-pay | Admitting: *Deleted

## 2015-07-12 NOTE — Telephone Encounter (Addendum)
Patient aware of results and next appointment date/time.  ----- Message from Volanda Napoleon, MD sent at 07/09/2015  5:05 PM EDT ----- Call - iron level is still good!!!  pete

## 2015-07-13 ENCOUNTER — Ambulatory Visit: Payer: Medicare Other | Attending: Family Medicine | Admitting: Physical Therapy

## 2015-07-13 DIAGNOSIS — M5441 Lumbago with sciatica, right side: Secondary | ICD-10-CM | POA: Insufficient documentation

## 2015-07-13 DIAGNOSIS — R262 Difficulty in walking, not elsewhere classified: Secondary | ICD-10-CM | POA: Diagnosis not present

## 2015-07-13 DIAGNOSIS — M542 Cervicalgia: Secondary | ICD-10-CM | POA: Insufficient documentation

## 2015-07-13 NOTE — Therapy (Signed)
[facility] [address]  [place], [place], [address] Phone: [phone]   Fax:  [phone]  Physical Therapy Treatment  Patient Details  Name: [name] MRN: [id number] Date of Birth: 02/07/1940 Referring Provider: [name], MD  Encounter Date: 07/13/2015      PT End of Session - 07/13/15 1547    Visit Number 15   Number of Visits 22   Date for PT Re-Evaluation 09/03/15   PT Start Time 85  Pt arrived late   PT Stop Time 1616   PT Time Calculation (min) 36 min   Activity Tolerance Patient tolerated treatment well   Behavior During Therapy [facility] for tasks assessed/performed      Past Medical History  Diagnosis Date  . Gastroparesis   . Pure hypercholesterolemia   . Headache(784.0)   . Unspecified essential hypertension   . Esophageal reflux   . Type II or unspecified type diabetes mellitus without mention of complication, not stated as uncontrolled   . Arthritis     Spinal Osteoarthritis  . Diverticulosis 08/30/2000    Colonoscopy   . Gastric polyp     Fundic Gland  . Anxiety   . Cataract   . Heart murmur     Echocardiogram 2/11: EF 60-65%, mild LAE, grade 1 diastolic dysfunction, aortic valve sclerosis, mean gradient 9 mm of mercury, PASP 34  . Chest pain     Myoview 12/15 no ischemia.  . Carcinoid tumor of stomach   . PSVT (paroxysmal supraventricular tachycardia) ([place])   . Leg swelling     bilateral  . Chronic kidney disease     Left kidney smaller than right kidney  . Cancer ([place])   . PONV (postoperative nausea and vomiting)     pt states only needs small amount of anesthesia  . Stroke ([place])     tia, 2014  . Iron deficiency anemia, unspecified   . Encounter for preventative adult health care exam with abnormal findings 09/14/2013  . Unspecified hereditary and idiopathic peripheral neuropathy 10/29/2013  . Diabetic peripheral neuropathy ([place]) 10/29/2013  . Diabetes mellitus type  2 in obese ([place]) 09/05/2006    Qualifier: Diagnosis of  By: Marca Ancona RMA, Lucy    . Anemia 06/08/2014  . 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 Neck pain 04/22/2015    Past Surgical History  Procedure Laterality Date  . Cholecystectomy  1993  . Tonsillectomy    . Colonoscopy  11/11/2010    diverticulosis  . Esophagogastroduodenoscopy  08/29/2010; 09/15/2010    Carcinoid tumor less than 1 cm in July 2012 not seen in August 2012 , gastritis, fundic gland polyps  . Eus  12/15/2010    Procedure: UPPER ENDOSCOPIC ULTRASOUND (EUS) LINEAR;  Surgeon: Owens Loffler, MD;  Location: [facility];  Service: Endoscopy;  Laterality: N/A;  . Esophagogastroduodenoscopy  05/16/2011  . Esophagogastroduodenoscopy  06/14/2012  . Dilatation & currettage/hysteroscopy with resectocope N/A 02/25/2013    Procedure: Attempted hysteroscopy with uterine perforation;  Surgeon: Jamey Reas de Berton Lan, MD;  Location: [facility];  Service: Gynecology;  Laterality: N/A;  . Laparoscopy N/A 02/25/2013    Procedure: Cystoscopy and laparoscopy with fulguration of uterine serosa;  Surgeon: Jamey Reas de Berton Lan, MD;  Location: [facility];  Service: Gynecology;  Laterality: N/A;  . Eye surgery Bilateral     Bi lateral cateracts and bi lateral laser    There were no  vitals filed for this visit.      Subjective Assessment - 07/13/15 1542    Subjective Pt states "pain is much better today, only about a 6/10 ... that's good isn't it?"   Currently in Pain? Yes   Pain Score 6    Pain Location Back   Pain Score 6   Pain Location Neck   Pain Orientation Left;Lateral;Lower           Today's Treatment  TherEx Seated chin tuck x10 B Seated UT stretch with hand grip on edge of seat 2x30" B Seated L Levator scapulae stretch 2x30" B Seated B Rhomboid/Middle trap stretch 2x30" Doorway Pec stretch 2x20" B Shoulder Rows with green TB 10x3" B Scapular Retraction + Shoulder Extension to neutral  with green TB 10x3" Hooklying on pink pool noodle:  Chest/pec stretch x2'   Chin tuck with thin folded pillow under head x10  B Shoulder Horiz ABD + Scap retratction with green TB 10x3"  B Shoulder ER with green TB 10x3"           PT Education - 07/13/15 1608    Education provided Yes   Education Details Green TB provided for cervical HEP   Person(s) Educated Patient   Methods Explanation;Demonstration   Comprehension Verbalized understanding;Returned demonstration          PT Short Term Goals - 06/07/15 1406    PT SHORT TERM GOAL #1   Title Independent with initial HEP by 06/09/15   Status Achieved           PT Long Term Goals - 07/07/15 1219    PT LONG TERM GOAL #1   Title Independent with advanced HEP as indicated by 08/13/15   Status Partially Met  Met for current HEP. Final review/update pending.   PT LONG TERM GOAL #2   Title Pt will demonstrate lumbar ROM 50% of normal or greater w/o increased pain by 07/07/15   Status Achieved   PT LONG TERM GOAL #3   Title Pt will tolerate sitting for 30 minutes or greater without increased neck, low back or leg pain while watching TV or working on computer by 07/07/15   Status Achieved   PT LONG TERM GOAL #4   Title Pt will report ability to perform normal ADL's and light household chores without increased low back or leg pain by 08/13/15   Status Partially Met  Able to complete ADL's w/o increased pain but still limited with household chores such as vacuuming & dusting low surfaces   PT LONG TERM GOAL #5   Title Pt will demonstrate increased cervical ROM in all planes by at least 10 degrees (5 degrees for extension) to allow for functional ROM of neck by 07/07/15   Status Achieved   PT LONG TERM GOAL #6   Title Pt will consistently demonstrate awareness of neutral neck and shoulder posture by 08/13/15    Status Partially Met  Reports increasing awareness but still requires self-correction 20% of time                Plan - 07/13/15 1609    Clinical Impression Statement Pt presents to therapy in good spirits noting good improvement in both her neck and low back pain, although continues to rate both at 6/10. Pt demonstrating improved neck and shoulder posture and reports more consistent awareness to the point that she is coaching her sister to correct her posture. Reviewed cervical HEP with pt able to demonstrate all exercises appropriately  and able to tolerate progression of TB resistance to green. Wil plan to review lumbar HEP at next visit tomorrow and if no concerns identified, will proceed with 30 day hold as previously discussed with pt.   PT Treatment/Interventions Patient/family education;Manual techniques;Passive range of motion;Taping;Therapeutic exercise;Therapeutic activities;Functional mobility training;Gait training;Neuromuscular re-education;Moist Heat;Electrical Stimulation;Cryotherapy;Traction;ADLs/Self Care Home Management   PT Next Visit Plan Review/update lumbar flexibility and stabilization HEP as appropriate; Manual therapy and modalities PRN +/- mechanical traction if benefit noted to manage pain   Consulted and Agree with Plan of Care Patient      Patient will benefit from skilled therapeutic intervention in order to improve the following deficits and impairments:  Pain, Impaired flexibility, Decreased range of motion, Increased muscle spasms, Decreased strength, Difficulty walking, Decreased activity tolerance, Postural dysfunction, Impaired perceived functional ability, Improper body mechanics  Visit Diagnosis: Bilateral low back pain with right-sided sciatica  Difficulty in walking, not elsewhere classified  Cervicalgia     Problem List Patient Active Problem List   Diagnosis Date Noted  . Pain of upper abdomen 06/06/2015  . Neck pain 04/22/2015  . Side pain 04/05/2015  . Ear pain 02/28/2015  . Abnormal urine 11/21/2014  . 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 Anemia 06/08/2014  . RLS (restless legs syndrome) 11/23/2013  . Diabetic peripheral neuropathy ([place]) 10/29/2013  . Left-sided thoracic back pain 10/06/2013  . Encounter for preventative adult health care exam with abnormal findings 09/14/2013  . Iron deficiency anemia   . Status post laparoscopy 02/25/2013  . GERD (gastroesophageal reflux disease) 01/09/2013  . Amaurosis fugax of left eye 10/16/2012  . Low back pain 06/03/2012  . Vitamin D deficiency 03/11/2012  . Bilateral hand pain 10/20/2011  . Encounter for long-term (current) use of other medications 10/20/2011  . IBS (irritable bowel syndrome) 08/14/2011  . TIA (transient ischemic attack) 02/10/2011  . Abnormal brain MRI 01/19/2011  . Allergic rhinitis 10/01/2010  . 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 FUNDIC GLAND POLYPS OF STOMACH 03/18/2010  . EPIGASTRIC PAIN chronic 03/18/2010  . ARTHRALGIA 03/17/2009  . SYSTOLIC MURMUR 68/37/2902  . Paroxysmal supraventricular tachycardia ([place]) 01/12/2009  . PLANTAR FASCIITIS 06/08/2008  . CHEST PAIN 05/18/2008  . Gastroparesis 12/18/2007  . HYPERCHOLESTEROLEMIA 06/11/2007  . Diabetes mellitus type 2 in obese ([place]) 09/05/2006  . Essential hypertension 09/05/2006    Percival Spanish, PT, MPT 07/13/2015, 6:43 PM  [facility] [address]  [place], [place], [address] Phone: [phone]   Fax:  [phone]  Name: [name] MRN: [id number] Date of Birth: January 04, 1941

## 2015-07-14 ENCOUNTER — Ambulatory Visit: Payer: Medicare Other | Admitting: Physical Therapy

## 2015-07-14 DIAGNOSIS — M542 Cervicalgia: Secondary | ICD-10-CM

## 2015-07-14 DIAGNOSIS — R262 Difficulty in walking, not elsewhere classified: Secondary | ICD-10-CM

## 2015-07-14 DIAGNOSIS — M5441 Lumbago with sciatica, right side: Secondary | ICD-10-CM

## 2015-07-14 NOTE — Therapy (Addendum)
[facility] [address]  [place], [place], [address] Phone: [phone]   Fax:  [phone]  Physical Therapy Treatment  Patient Details  Name: [name] MRN: [id number] Date of Birth: 17-May-1940 Referring Provider: [name], MD  Encounter Date: 07/14/2015      PT End of Session - 07/14/15 1111    Visit Number 16   Number of Visits 22   Date for PT Re-Evaluation 09/03/15   PT Start Time 1101   PT Stop Time 1145   PT Time Calculation (min) 44 min   Activity Tolerance Patient tolerated treatment well   Behavior During Therapy [facility] for tasks assessed/performed      Past Medical History  Diagnosis Date  . Gastroparesis   . Pure hypercholesterolemia   . Headache(784.0)   . Unspecified essential hypertension   . Esophageal reflux   . Type II or unspecified type diabetes mellitus without mention of complication, not stated as uncontrolled   . Arthritis     Spinal Osteoarthritis  . Diverticulosis 08/30/2000    Colonoscopy   . Gastric polyp     Fundic Gland  . Anxiety   . Cataract   . Heart murmur     Echocardiogram 2/11: EF 60-65%, mild LAE, grade 1 diastolic dysfunction, aortic valve sclerosis, mean gradient 9 mm of mercury, PASP 34  . Chest pain     Myoview 12/15 no ischemia.  . Carcinoid tumor of stomach   . PSVT (paroxysmal supraventricular tachycardia) ([place])   . Leg swelling     bilateral  . Chronic kidney disease     Left kidney smaller than right kidney  . Cancer ([place])   . PONV (postoperative nausea and vomiting)     pt states only needs small amount of anesthesia  . Stroke ([place])     tia, 2014  . Iron deficiency anemia, unspecified   . Encounter for preventative adult health care exam with abnormal findings 09/14/2013  . Unspecified hereditary and idiopathic peripheral neuropathy 10/29/2013  . Diabetic peripheral neuropathy ([place]) 10/29/2013  . Diabetes mellitus type 2 in obese ([place])  09/05/2006    Qualifier: Diagnosis of  By: Marca Ancona RMA, Lucy    . Anemia 06/08/2014  . 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 Neck pain 04/22/2015    Past Surgical History  Procedure Laterality Date  . Cholecystectomy  1993  . Tonsillectomy    . Colonoscopy  11/11/2010    diverticulosis  . Esophagogastroduodenoscopy  08/29/2010; 09/15/2010    Carcinoid tumor less than 1 cm in July 2012 not seen in August 2012 , gastritis, fundic gland polyps  . Eus  12/15/2010    Procedure: UPPER ENDOSCOPIC ULTRASOUND (EUS) LINEAR;  Surgeon: Owens Loffler, MD;  Location: [facility];  Service: Endoscopy;  Laterality: N/A;  . Esophagogastroduodenoscopy  05/16/2011  . Esophagogastroduodenoscopy  06/14/2012  . Dilatation & currettage/hysteroscopy with resectocope N/A 02/25/2013    Procedure: Attempted hysteroscopy with uterine perforation;  Surgeon: Jamey Reas de Berton Lan, MD;  Location: [facility];  Service: Gynecology;  Laterality: N/A;  . Laparoscopy N/A 02/25/2013    Procedure: Cystoscopy and laparoscopy with fulguration of uterine serosa;  Surgeon: Jamey Reas de Berton Lan, MD;  Location: [facility];  Service: Gynecology;  Laterality: N/A;  . Eye surgery Bilateral     Bi lateral cateracts and bi lateral laser    There were no vitals filed for this  visit.      Subjective Assessment - 07/14/15 1101    Subjective Pt states "pain is much better today, only about a 6/10 ... that's good isn't it?"   Currently in Pain? Yes   Pain Score 6    Pain Location Back   Pain Score 6   Pain Location Neck            [facility] PT Assessment - 07/14/15 1101    Assessment   Medical Diagnosis Low back pain with sciatica; Neck pain   Referring Provider [name], MD   Onset Date/Surgical Date 04/22/15   Next MD Visit 08/26/15   Observation/Other Assessments   Focus on Therapeutic Outcomes (FOTO)  Lumbar Spine - 57% (43% limitation)   AROM   AROM Assessment Site  Cervical;Shoulder;Lumbar   Right Shoulder Flexion 141 Degrees   Left Shoulder Flexion 142 Degrees   Cervical Flexion 53   Cervical Extension 42   Cervical - Right Side Bend 32   Cervical - Left Side Bend 31   Cervical - Right Rotation 62   Cervical - Left Rotation 60   Lumbar Flexion 80% (hands to lower shins)    Lumbar Extension 50%   Lumbar - Right Side Bend 75%   Lumbar - Left Side Bend 75%   Lumbar - Right Rotation 70%   Lumbar - Left Rotation 80%   Strength   Overall Strength Comments B Shoulder grossly 4+/5 except ER 4/5 (no pain)   Strength Assessment Site Hip   Right Hip Flexion 4/5   Right Hip Extension 4-/5   Right Hip ABduction 4+/5   Right Hip ADduction 4/5   Left Hip Flexion 4/5   Left Hip Extension 4-/5   Left Hip ABduction 4+/5   Left Hip ADduction 4/5         Today's Treatment  TherEx NuStep - lvl 4 x 5' B SKTC stretch 3x20" B Hamstring stretch with strap 3x20" B KTOS piriformis stretch 3x20" LTR 10x5"  Bridge 10x3" TrA + Dead bug x12 TrA + Hooklying Alternating Hip ABD/ER with blue TB x12 TrA + Hooklying March with blue TB x12 DKTC 10x3"          PT Education - 07/14/15 1154    Education provided Yes   Education Details Reviewed and updated lumbar HEP with blue TB provided for home use   Person(s) Educated Patient   Methods Explanation;Demonstration;Handout   Comprehension Verbalized understanding;Returned demonstration          PT Short Term Goals - 06/07/15 1406    PT SHORT TERM GOAL #1   Title Independent with initial HEP by 06/09/15   Status Achieved           PT Long Term Goals - 07/14/15 1211    PT LONG TERM GOAL #1   Title Independent with advanced HEP as indicated by 08/13/15   Status Achieved   PT LONG TERM GOAL #2   Title Pt will demonstrate lumbar ROM 50% of normal or greater w/o increased pain by 07/07/15   Status Achieved   PT LONG TERM GOAL #3   Title Pt will tolerate sitting for 30 minutes or greater without  increased neck, low back or leg pain while watching TV or working on computer by 07/07/15   Status Achieved   PT LONG TERM GOAL #4   Title Pt will report ability to perform normal ADL's and light household chores without increased low back or leg pain by 08/13/15  Status Achieved   PT LONG TERM GOAL #5   Title Pt will demonstrate increased cervical ROM in all planes by at least 10 degrees (5 degrees for extension) to allow for functional ROM of neck by 07/07/15   Status Achieved   PT LONG TERM GOAL #6   Title Pt will consistently demonstrate awareness of neutral neck and shoulder posture by 08/13/15    Status Achieved               Plan - 07-31-2015 1203    Clinical Impression Statement Pt very pleased with progress and notes overall improvement in both neck and low back pain despite continued 6/10 pain ratings for both (pain ratings have often not seemed to correlate with pain behavior and/or pt functional reports). Pt able to demonstrate functional ROM for both cervical and lumbar spine without pain reported during ROM assessment. Pt denies any concerns for yesterday's review of cervical HEP, therefore proceeded with review and update of lumbar HEP today, with pt able to perform all exercises appropriately. All goals currently met and pt feels ready to attempt transition to HEP, but would like to remain on hold for 30 days in the vent of issues with HEP and need to return for further training. If no need to return within 30 days, will proceed with discharge.   PT Treatment/Interventions Patient/family education;Manual techniques;Passive range of motion;Taping;Therapeutic exercise;Therapeutic activities;Functional mobility training;Gait training;Neuromuscular re-education;Moist Heat;Electrical Stimulation;Cryotherapy;Traction;ADLs/Self Care Home Management   PT Next Visit Plan 30 day hold   Consulted and Agree with Plan of Care Patient      Patient will benefit from skilled therapeutic  intervention in order to improve the following deficits and impairments:  Pain, Impaired flexibility, Decreased range of motion, Increased muscle spasms, Decreased strength, Difficulty walking, Decreased activity tolerance, Postural dysfunction, Impaired perceived functional ability, Improper body mechanics  Visit Diagnosis: Bilateral low back pain with right-sided sciatica  Difficulty in walking, not elsewhere classified  Cervicalgia       G-Codes - July 31, 2015 1213    Functional Assessment Tool Used Lumbar Spine FOTO = 57% (43% limitation)   Functional Limitation Changing and maintaining body position   Changing and Maintaining Body Position Goal Status (G1829) At least 20 percent but less than 40 percent impaired, limited or restricted   Changing and Maintaining Body Position Discharge Status (H3716) At least 40 percent but less than 60 percent impaired, limited or restricted      Problem List Patient Active Problem List   Diagnosis Date Noted  . Pain of upper abdomen 06/06/2015  . Neck pain 04/22/2015  . Side pain 04/05/2015  . Ear pain 02/28/2015  . Abnormal urine 11/21/2014  . 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 Anemia 06/08/2014  . RLS (restless legs syndrome) 11/23/2013  . Diabetic peripheral neuropathy ([place]) 10/29/2013  . Left-sided thoracic back pain 10/06/2013  . Encounter for preventative adult health care exam with abnormal findings 09/14/2013  . Iron deficiency anemia   . Status post laparoscopy 02/25/2013  . GERD (gastroesophageal reflux disease) 01/09/2013  . Amaurosis fugax of left eye 10/16/2012  . Low back pain 06/03/2012  . Vitamin D deficiency 03/11/2012  . Bilateral hand pain 10/20/2011  . Encounter for long-term (current) use of other medications 10/20/2011  . IBS (irritable bowel syndrome) 08/14/2011  . TIA (transient ischemic attack) 02/10/2011  . Abnormal brain MRI 01/19/2011  . Allergic rhinitis 10/01/2010  . Carcinoid  tumor of stomach- history of 09/29/2010  . FUNDIC  GLAND POLYPS OF STOMACH 03/18/2010  . EPIGASTRIC PAIN chronic 03/18/2010  . ARTHRALGIA 03/17/2009  . SYSTOLIC MURMUR 17/51/0258  . Paroxysmal supraventricular tachycardia ([place]) 01/12/2009  . PLANTAR FASCIITIS 06/08/2008  . CHEST PAIN 05/18/2008  . Gastroparesis 12/18/2007  . HYPERCHOLESTEROLEMIA 06/11/2007  . Diabetes mellitus type 2 in obese ([place]) 09/05/2006  . Essential hypertension 09/05/2006    Percival Spanish, PT, MPT 07/14/2015, 12:18 PM  [facility] [address]  [place], [place], [address] Phone: [phone]   Fax:  [phone]  Name: LORILYNN LEHR MRN: [id number] Date of Birth: [birth year]   PHYSICAL THERAPY DISCHARGE SUMMARY  Visits from Start of Care: 16  Current functional level related to goals / functional outcomes: Some improvement - see above note for discharge status and progress toward goals   Remaining deficits: Continued pain as noted above    Education / Equipment: HEP   Plan: Patient agrees to discharge.  Patient goals were partially met. Patient is being discharged due to not returning since the last visit.  ?????     Celyn P. Helene Kelp PT, MPH 08/12/2015 11:43 AM

## 2015-07-16 DIAGNOSIS — E119 Type 2 diabetes mellitus without complications: Secondary | ICD-10-CM | POA: Diagnosis not present

## 2015-07-16 DIAGNOSIS — M25571 Pain in right ankle and joints of right foot: Secondary | ICD-10-CM | POA: Diagnosis not present

## 2015-07-22 ENCOUNTER — Other Ambulatory Visit: Payer: Self-pay | Admitting: Cardiology

## 2015-07-22 ENCOUNTER — Other Ambulatory Visit: Payer: Self-pay | Admitting: Family Medicine

## 2015-07-23 NOTE — Telephone Encounter (Signed)
Rx(s) sent to pharmacy electronically.  

## 2015-07-30 ENCOUNTER — Telehealth: Payer: Self-pay

## 2015-07-30 NOTE — Telephone Encounter (Signed)
Called patient regarding billing concern Patient has received EOB from medicare stating they will not cover her Ultra Sound from 06-28-15 due to it not being coded correctly. I told patient that nothing is pending in the system to wait until she received a medical bill from [facility] because if the insurance tell billing it is coded incorrectly they will resubmit it with another code. Patient voiced understanding and I give her my direct dial.

## 2015-08-16 ENCOUNTER — Telehealth: Payer: Self-pay | Admitting: Cardiology

## 2015-08-16 ENCOUNTER — Telehealth: Payer: Self-pay | Admitting: *Deleted

## 2015-08-16 NOTE — Telephone Encounter (Signed)
Returned call to patient. She said she will wait for her appt with PCP next week and will call if she has any other problems. She is aware to go to ER if this happens again.

## 2015-08-16 NOTE — Telephone Encounter (Signed)
She would need to come into see a APP.  If she can be worked in to see Lurena Joiner or Suanne Marker before her PCP appt we can do this.  Otherwise she should follow up with scheduled PCP appt.  She was advised to go to the ED but refused.

## 2015-08-16 NOTE — Telephone Encounter (Signed)
[employer] note received via fax  Call:   Date: 08/14/15 Time: 1026   Caller: Self Return number: [phone]  Nurse: Haroldine Laws, RN  Chief Complaint: Heart palpitations or irregular heartbeat  Reason for call: Caller states this morning her heart started beating really fast..blood pressure is 194/119 and pulse is 147, took 4 baby aspirin. Requesting to speak to the doctor. She re-checked BP and pulse while on the phone with nurse at 1010 am and BP is 156/86 with pulse of 64.   Related visit to physician within the last 2 weeks: No   Guideline: High Blood Pressure; Systolic BP >= OR Diastolic >= [id number] AND cardiac or neurologic symptoms (e.g., chest pain, difficulty breathing, unsteady gait, blurred vision)  Disposition: Go to ED Now  --  Called pt for follow-up. She did not go to ED as advised. States she has had episodes like this in the past and when she goes to ED she keep her overnight for testing, then tell her she's fine and send her home to rest. Pt does have h/o /paroxysmal SVT. She was told in the past she could take an extra tablet of metoprolol for rapid HR/palpitations, so she did that on Saturday. She has no chest pain, SOB, dizziness, weakness/numbness of extremities. Her BP at time of call is 173/79, P 60. She reports her BP tends to run high like this in the morning, and she does sometimes experience HA when it is high. She has taken metoprolol and valsartan this morning, takes amlodipine in the evenings. All BP medications prescribed by Dr. Percival Spanish. She has f/u scheduled w/ Dr. Charlett Blake 08/26/15, no sooner appts available. She called cards/Dr. Hochrein's office this morning and has not received return call. She agrees to ED eval if symptoms worsen or alarm symptoms. Will forward to PCP and Dr. Percival Spanish for [facility].  BP Readings from Last 3 Encounters:  07/08/15 131/63  05/25/15 124/72  05/14/15 152/88

## 2015-08-16 NOTE — Telephone Encounter (Signed)
[name] is calling because her heart rate is up to 137 .Marland Kitchen Please call   Thanks

## 2015-08-16 NOTE — Telephone Encounter (Signed)
Returned call to patient. She said she had an incident on Saturday where her heartrate went up to 137. She felt like her heart was pounding, felt dizzy and tired. Her BP during that time was 198/130. This occurred for about 30 minutes. She put her head in ice, bared down and took an extra dose of metoprolol. She said after that she felt better.  This has not occurred since then. Today her BP is 161/79, HR 59. She says she is feeling fine today. She says sometimes she feels palpitations but they only last a few seconds. Advised patient to continue taking medications as prescribed and will forward to Dr Percival Spanish for further advice if needed. Patient wanted to let Dr Percival Spanish know. Patient aware that if this happens and her heartrate does not go down to call 911.

## 2015-08-19 ENCOUNTER — Ambulatory Visit: Payer: Medicare Other | Admitting: Family Medicine

## 2015-08-24 ENCOUNTER — Other Ambulatory Visit (INDEPENDENT_AMBULATORY_CARE_PROVIDER_SITE_OTHER): Payer: Medicare Other

## 2015-08-24 DIAGNOSIS — I471 Supraventricular tachycardia: Secondary | ICD-10-CM

## 2015-08-24 DIAGNOSIS — K219 Gastro-esophageal reflux disease without esophagitis: Secondary | ICD-10-CM

## 2015-08-24 DIAGNOSIS — E1142 Type 2 diabetes mellitus with diabetic polyneuropathy: Secondary | ICD-10-CM | POA: Diagnosis not present

## 2015-08-24 DIAGNOSIS — D509 Iron deficiency anemia, unspecified: Secondary | ICD-10-CM

## 2015-08-24 DIAGNOSIS — E119 Type 2 diabetes mellitus without complications: Secondary | ICD-10-CM | POA: Diagnosis not present

## 2015-08-24 DIAGNOSIS — E782 Mixed hyperlipidemia: Secondary | ICD-10-CM | POA: Diagnosis not present

## 2015-08-24 DIAGNOSIS — I10 Essential (primary) hypertension: Secondary | ICD-10-CM | POA: Diagnosis not present

## 2015-08-24 DIAGNOSIS — E559 Vitamin D deficiency, unspecified: Secondary | ICD-10-CM

## 2015-08-24 DIAGNOSIS — E1169 Type 2 diabetes mellitus with other specified complication: Secondary | ICD-10-CM

## 2015-08-24 DIAGNOSIS — R1013 Epigastric pain: Secondary | ICD-10-CM

## 2015-08-24 DIAGNOSIS — R1010 Upper abdominal pain, unspecified: Secondary | ICD-10-CM

## 2015-08-24 DIAGNOSIS — E669 Obesity, unspecified: Secondary | ICD-10-CM | POA: Diagnosis not present

## 2015-08-24 DIAGNOSIS — E7800 Pure hypercholesterolemia, unspecified: Secondary | ICD-10-CM

## 2015-08-24 DIAGNOSIS — I700 Atherosclerosis of aorta: Secondary | ICD-10-CM | POA: Diagnosis not present

## 2015-08-24 DIAGNOSIS — M542 Cervicalgia: Secondary | ICD-10-CM | POA: Diagnosis not present

## 2015-08-24 LAB — HEMOGLOBIN A1C: HEMOGLOBIN A1C: 6.3 % (ref 4.6–6.5)

## 2015-08-24 LAB — COMPREHENSIVE METABOLIC PANEL
ALK PHOS: 53 U/L (ref 39–117)
ALT: 23 U/L (ref 0–35)
AST: 21 U/L (ref 0–37)
Albumin: 4.1 g/dL (ref 3.5–5.2)
BILIRUBIN TOTAL: 0.7 mg/dL (ref 0.2–1.2)
BUN: 13 mg/dL (ref 6–23)
CO2: 27 meq/L (ref 19–32)
CREATININE: 0.65 mg/dL (ref 0.40–1.20)
Calcium: 9.3 mg/dL (ref 8.4–10.5)
Chloride: 100 mEq/L (ref 96–112)
GFR: 94.6 mL/min (ref >60.00)
GLUCOSE: 115 mg/dL — AB (ref 70–99)
Potassium: 4 mEq/L (ref 3.5–5.1)
Sodium: 135 mEq/L (ref 135–145)
TOTAL PROTEIN: 7.1 g/dL (ref 6.0–8.3)

## 2015-08-24 LAB — LIPID PANEL
CHOL/HDL RATIO: 3
Cholesterol: 193 mg/dL (ref 0–200)
HDL: 61.5 mg/dL (ref >39.00)
LDL CALC: 108 mg/dL — AB (ref 0–99)
NONHDL: 131.77
Triglycerides: 117 mg/dL (ref 0.0–149.0)
VLDL: 23.4 mg/dL (ref 0.0–40.0)

## 2015-08-24 LAB — CBC
HCT: 37.5 % (ref 36.0–46.0)
HEMOGLOBIN: 12.8 g/dL (ref 12.0–15.0)
MCHC: 34.1 g/dL (ref 30.0–36.0)
MCV: 85.2 fl (ref 78.0–100.0)
Platelets: 215 10*3/uL (ref 150.0–400.0)
RBC: 4.4 Mil/uL (ref 3.87–5.11)
RDW: 12.2 % (ref 11.5–15.5)
WBC: 6.6 10*3/uL (ref 4.0–10.5)

## 2015-08-24 LAB — TSH: TSH: 1.97 u[IU]/mL (ref 0.35–4.50)

## 2015-08-26 ENCOUNTER — Ambulatory Visit (INDEPENDENT_AMBULATORY_CARE_PROVIDER_SITE_OTHER): Payer: Medicare Other | Admitting: Family Medicine

## 2015-08-26 ENCOUNTER — Encounter: Payer: Self-pay | Admitting: Family Medicine

## 2015-08-26 DIAGNOSIS — E7800 Pure hypercholesterolemia, unspecified: Secondary | ICD-10-CM

## 2015-08-26 DIAGNOSIS — E119 Type 2 diabetes mellitus without complications: Secondary | ICD-10-CM

## 2015-08-26 DIAGNOSIS — E1169 Type 2 diabetes mellitus with other specified complication: Secondary | ICD-10-CM

## 2015-08-26 DIAGNOSIS — I10 Essential (primary) hypertension: Secondary | ICD-10-CM

## 2015-08-26 DIAGNOSIS — H9202 Otalgia, left ear: Secondary | ICD-10-CM | POA: Diagnosis not present

## 2015-08-26 DIAGNOSIS — E669 Obesity, unspecified: Secondary | ICD-10-CM

## 2015-08-26 MED ORDER — NEOMYCIN-POLYMYXIN-HC 3.5-10000-1 OT SOLN: 3.0000 [drp] | Freq: Three times a day (TID) | OTIC | Status: DC

## 2015-08-26 MED ORDER — RANITIDINE HCL 300 MG PO TABS: 300.0000 mg | ORAL_TABLET | Freq: Every day | ORAL | Status: DC

## 2015-08-26 NOTE — Patient Instructions (Addendum)
Try moist heat and aspercreme to neck twice daily Can move appointment out 6 more weeks if you are feeling well  Otitis Externa Otitis externa is a bacterial or fungal infection of the outer ear canal. This is the area from the eardrum to the outside of the ear. Otitis externa is sometimes called "swimmer's ear." CAUSES  Possible causes of infection include:  Swimming in dirty water.  Moisture remaining in the ear after swimming or bathing.  Mild injury (trauma) to the ear.  Objects stuck in the ear (foreign body).  Cuts or scrapes (abrasions) on the outside of the ear. SIGNS AND SYMPTOMS  The first symptom of infection is often itching in the ear canal. Later signs and symptoms may include swelling and redness of the ear canal, ear pain, and yellowish-white fluid (pus) coming from the ear. The ear pain may be worse when pulling on the earlobe. DIAGNOSIS  Your health care provider will perform a physical exam. A sample of fluid may be taken from the ear and examined for bacteria or fungi. TREATMENT  Antibiotic ear drops are often given for 10 to 14 days. Treatment may also include pain medicine or corticosteroids to reduce itching and swelling. HOME CARE INSTRUCTIONS   Apply antibiotic ear drops to the ear canal as prescribed by your health care provider.  Take medicines only as directed by your health care provider.  If you have diabetes, follow any additional treatment instructions from your health care provider.  Keep all follow-up visits as directed by your health care provider. PREVENTION   Keep your ear dry. Use the corner of a towel to absorb water out of the ear canal after swimming or bathing.  Avoid scratching or putting objects inside your ear. This can damage the ear canal or remove the protective wax that lines the canal. This makes it easier for bacteria and fungi to grow.  Avoid swimming in lakes, polluted water, or poorly chlorinated pools.  You may use ear  drops made of rubbing alcohol and vinegar after swimming. Combine equal parts of white vinegar and alcohol in a bottle. Put 3 or 4 drops into each ear after swimming. SEEK MEDICAL CARE IF:   You have a fever.  Your ear is still red, swollen, painful, or draining pus after 3 days.  Your redness, swelling, or pain gets worse.  You have a severe headache.  You have redness, swelling, pain, or tenderness in the area behind your ear. MAKE SURE YOU:   Understand these instructions.  Will watch your condition.  Will get help right away if you are not doing well or get worse.   This information is not intended to replace advice given to you by your health care provider. Make sure you discuss any questions you have with your health care provider.   Document Released: 01/23/2005 Document Revised: 02/13/2014 Document Reviewed: 02/09/2011 Elsevier Interactive Patient Education [employer].

## 2015-08-27 LAB — VITAMIN D 1,25 DIHYDROXY
Vitamin D 1, 25 (OH)2 Total: 37 pg/mL (ref 18–72)
Vitamin D2 1, 25 (OH)2: <8 pg/mL
Vitamin D3 1, 25 (OH)2: 37 pg/mL

## 2015-08-31 ENCOUNTER — Encounter: Payer: Self-pay | Admitting: Internal Medicine

## 2015-09-15 NOTE — Progress Notes (Signed)
[name], female   DOB: 06-Dec-1940, 75 y.o.   MRN: [id number]   Subjective:    [name], female    DOB: 11/02/1940, 75 y.o.   MRN: [id number]  Chief Complaint  Patient presents with  . Ear Pain    HPI Patient is in today for follow up and complains of left ear pain. Is also noting stiffness and discomfort in left neck. No falls or injury. Notes blood sugar has been up and down. No polyuria or polydipsia. Has also noted some episodes of feeling lightheaded and even sslight spinning sensations which have resolved spontaneously and then recurred over past week or so. Denies CP/palp/SOB/HA/congestion/fevers/GI or GU c/o. Taking meds as prescribed  Past Medical History:  Diagnosis Date  . Anemia 06/08/2014  . Anxiety   . Arthritis    Spinal Osteoarthritis  . Cancer ([place])   . Carcinoid tumor of stomach   . Cataract   . Chest pain    Myoview 12/15 no ischemia.  . Chronic kidney disease    Left kidney smaller than right kidney  . Diabetes mellitus type 2 in obese ([place]) 09/05/2006   Qualifier: Diagnosis of  By: Marca Ancona RMA, Lucy    . Diabetic peripheral neuropathy ([place]) 10/29/2013  . Diverticulosis 08/30/2000   Colonoscopy   . Encounter for preventative adult health care exam with abnormal findings 09/14/2013  . Esophageal reflux   . Gastric polyp    Fundic Gland  . Gastroparesis   . Headache(784.0)   . Heart murmur    Echocardiogram 2/11: EF 60-65%, mild LAE, grade 1 diastolic dysfunction, aortic valve sclerosis, mean gradient 9 mm of mercury, PASP 34  . Iron deficiency anemia, unspecified   . 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 Leg swelling    bilateral  . Neck pain 04/22/2015  . PONV (postoperative nausea and vomiting)    pt states only needs small amount of anesthesia  . PSVT (paroxysmal supraventricular tachycardia) ([place])   . Pure hypercholesterolemia   . Stroke ([place])    tia, 2014  . 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 Type II or unspecified type  diabetes mellitus without mention of complication, not stated as uncontrolled   . Unspecified essential hypertension   . Unspecified hereditary and idiopathic peripheral neuropathy 10/29/2013    Past Surgical History:  Procedure Laterality Date  . CHOLECYSTECTOMY  1993  . COLONOSCOPY  11/11/2010   diverticulosis  . DILATATION & CURRETTAGE/HYSTEROSCOPY WITH RESECTOCOPE N/A 02/25/2013   Procedure: Attempted hysteroscopy with uterine perforation;  Surgeon: Jamey Reas de Berton Lan, MD;  Location: [facility];  Service: Gynecology;  Laterality: N/A;  . ESOPHAGOGASTRODUODENOSCOPY  08/29/2010; 09/15/2010   Carcinoid tumor less than 1 cm in July 2012 not seen in August 2012 , gastritis, fundic gland polyps  . ESOPHAGOGASTRODUODENOSCOPY  05/16/2011  . ESOPHAGOGASTRODUODENOSCOPY  06/14/2012  . EUS  12/15/2010   Procedure: UPPER ENDOSCOPIC ULTRASOUND (EUS) LINEAR;  Surgeon: Owens Loffler, MD;  Location: [facility];  Service: Endoscopy;  Laterality: N/A;  . EYE SURGERY Bilateral    Bi lateral cateracts and bi lateral laser  . LAPAROSCOPY N/A 02/25/2013   Procedure: Cystoscopy and laparoscopy with fulguration of uterine serosa;  Surgeon: Jamey Reas de Berton Lan, MD;  Location: [facility];  Service: Gynecology;  Laterality: N/A;  . TONSILLECTOMY      Family History  Problem Relation Age of Onset  . Diabetes Mother   . Stroke Father     deceased  age 13  . Heart disease Sister     deceased MI age 18  . Heart disease Brother     deceased MI age 79  . Colon cancer Neg Hx   . Esophageal cancer Neg Hx   . Stomach cancer Neg Hx   . Rectal cancer Neg Hx   . Diabetes Maternal Grandmother   . Hypertension Paternal Grandmother   . Diabetes Sister   . Heart disease Sister   . Hypertension Sister   . Hyperlipidemia Sister   . Diabetes Sister   . Heart disease Sister   . Hypertension Sister   . Hyperlipidemia Sister   . Diabetes Brother   . Heart disease Brother   . Hypertension Brother    . Hyperlipidemia Brother     Social History   Social History  . Marital status: Married    Spouse name: N/A  . Number of children: 0  . Years of education: college   Occupational History  . Retail    Social History Main Topics  . Smoking status: Never Smoker  . Smokeless tobacco: Never Used     Comment: Never used tobacco  . Alcohol use No  . Drug use: No  . Sexual activity: No     Comment: lives alone, no dietary restrictions except avoid fresh veg, fruit, whole grains   Other Topics Concern  . Not on file   Social History Narrative   Patient was married ([name]) - widow   Patient does not have any children.   Patient is right-handed.   Patient has a BA degree.   One caffeine drink daily     Outpatient Medications Prior to Visit  Medication Sig Dispense Refill  . acetaminophen (TYLENOL) 500 MG tablet Take 2 tablets (1,000 mg total) by mouth every 6 (six) hours as needed for moderate pain. 30 tablet 0  . ALPRAZolam (XANAX) 0.25 MG tablet Take 1 tablet (0.25 mg total) by mouth 2 (two) times daily as needed for anxiety. 30 tablet 1  . amLODipine (NORVASC) 5 MG tablet Take 1 tablet by mouth  daily 90 tablet 3  . aspirin 81 MG tablet Take 81 mg by mouth daily.    . calcium-vitamin D (OSCAL WITH D) 500-200 MG-UNIT per tablet Take 1 tablet by mouth.    . cholecalciferol (VITAMIN D) 1000 UNITS tablet Take 1,000 Units by mouth daily.      Marland Kitchen dicyclomine (BENTYL) 10 MG capsule Take 1 capsule (10 mg total) by mouth 4 (four) times daily as needed. 120 capsule 6  . esomeprazole (NEXIUM) 40 MG capsule Take 1 capsule (40 mg total) by mouth daily before breakfast. 30 capsule 11  . FOLBIC 2.5-25-2 MG TABS tablet TAKE 1 TABLET BY MOUTH EVERY DAY 90 tablet 3  . glucose blood test strip Take once daily and as needed.  DX E11.9 100 each 3  . Lancets MISC Use as instructed once daily to check blood sugar. DX E11.9 (Patient taking differently: Use as instructed twice daily to check blood  sugar. DX E11.9) 200 each 2  . metFORMIN (GLUCOPHAGE-XR) 500 MG 24 hr tablet Take 1 tablet by mouth 3  times daily with meals 270 tablet 1  . metoCLOPramide (REGLAN) 5 MG tablet Take 1 tablet by mouth two  times daily 180 tablet 1  . metoprolol (LOPRESSOR) 50 MG tablet Take 1 tablet by mouth two  times daily 180 tablet 3  . Multiple Vitamin (MULTIVITAMIN) tablet Take 1 tablet by mouth daily.      Marland Kitchen  ondansetron (ZOFRAN) 4 MG tablet Take 4 mg by mouth every 4 (four) hours as needed. For nausea      . potassium chloride (K-DUR,KLOR-CON) 10 MEQ tablet Take 1 tablet by mouth  daily 90 tablet 0  . rosuvastatin (CRESTOR) 10 MG tablet Take 0.5 tablets (5 mg total) by mouth daily. 45 tablet 1  . valsartan (DIOVAN) 320 MG tablet Take 1 tablet by mouth  daily 90 tablet 3  . vitamin E 400 UNIT capsule Take 400 Units by mouth daily.    Marland Kitchen zoster vaccine live, PF, (ZOSTAVAX) 97026 UNT/0.65ML injection Inject 19,400 Units into the skin once. 1 each 0   No facility-administered medications prior to visit.     Allergies  Allergen Reactions  . Tramadol     Dizziness     Review of Systems  Constitutional: Negative for fever and malaise/fatigue.  HENT: Positive for ear pain. Negative for congestion and ear discharge.   Eyes: Negative for blurred vision.  Respiratory: Negative for shortness of breath.   Cardiovascular: Negative for chest pain, palpitations and leg swelling.  Gastrointestinal: Negative for abdominal pain, blood in stool and nausea.  Genitourinary: Negative for dysuria and frequency.  Musculoskeletal: Positive for neck pain. Negative for falls.  Skin: Negative for rash.  Neurological: Positive for dizziness. Negative for loss of consciousness and headaches.  Endo/Heme/Allergies: Negative for environmental allergies.  Psychiatric/Behavioral: Negative for depression. The patient is not nervous/anxious.        Objective:    Physical Exam  Constitutional: She is oriented to person,  place, and time. She appears well-developed and well-nourished. No distress.  HENT:  Head: Normocephalic and atraumatic.  Nose: Nose normal.  Left external canal and TM mildly erythematous. No skin break down  Eyes: Right eye exhibits no discharge. Left eye exhibits no discharge.  Neck: Normal range of motion. Neck supple.  Cardiovascular: Normal rate and regular rhythm.   No murmur heard. Pulmonary/Chest: Effort normal and breath sounds normal.  Abdominal: Soft. Bowel sounds are normal. There is no tenderness.  Musculoskeletal: She exhibits no edema.  Neurological: She is alert and oriented to person, place, and time.  Skin: Skin is warm and dry.  Psychiatric: She has a normal mood and affect.  Nursing note and vitals reviewed.   BP 122/82   Pulse 79   Temp 98.3 F (36.8 C) (Oral)   Ht _0  (1.549 m)   Wt 172 lb 4 oz (78.1 kg)   LMP 02/07/1992   SpO2 96%   BMI 32.55 kg/m  Wt Readings from Last 3 Encounters:  08/26/15 172 lb 4 oz (78.1 kg)  07/08/15 175 lb (79.4 kg)  05/25/15 177 lb (80.3 kg)     Lab Results  Component Value Date   WBC 6.6 08/24/2015   HGB 12.8 08/24/2015   HCT 37.5 08/24/2015   PLT 215.0 08/24/2015   GLUCOSE 115 (H) 08/24/2015   CHOL 193 08/24/2015   TRIG 117.0 08/24/2015   HDL 61.50 08/24/2015   LDLDIRECT 123.7 04/13/2011   LDLCALC 108 (H) 08/24/2015   ALT 23 08/24/2015   AST 21 08/24/2015   NA 135 08/24/2015   K 4.0 08/24/2015   CL 100 08/24/2015   CREATININE 0.65 08/24/2015   BUN 13 08/24/2015   CO2 27 08/24/2015   TSH 1.97 08/24/2015   INR 0.88 01/16/2011   HGBA1C 6.3 08/24/2015   MICROALBUR 1.6 11/13/2014    Lab Results  Component Value Date   TSH 1.97 08/24/2015  Lab Results  Component Value Date   WBC 6.6 08/24/2015   HGB 12.8 08/24/2015   HCT 37.5 08/24/2015   MCV 85.2 08/24/2015   PLT 215.0 08/24/2015   Lab Results  Component Value Date   NA 135 08/24/2015   K 4.0 08/24/2015   CHLORIDE 101 07/08/2015   CO2 27  08/24/2015   GLUCOSE 115 (H) 08/24/2015   BUN 13 08/24/2015   CREATININE 0.65 08/24/2015   BILITOT 0.7 08/24/2015   ALKPHOS 53 08/24/2015   AST 21 08/24/2015   ALT 23 08/24/2015   PROT 7.1 08/24/2015   ALBUMIN 4.1 08/24/2015   CALCIUM 9.3 08/24/2015   ANIONGAP 8 07/08/2015   EGFR 75 (L) 07/08/2015   GFR 94.60 08/24/2015   Lab Results  Component Value Date   CHOL 193 08/24/2015   Lab Results  Component Value Date   HDL 61.50 08/24/2015   Lab Results  Component Value Date   LDLCALC 108 (H) 08/24/2015   Lab Results  Component Value Date   TRIG 117.0 08/24/2015   Lab Results  Component Value Date   CHOLHDL 3 08/24/2015   Lab Results  Component Value Date   HGBA1C 6.3 08/24/2015       Assessment & Plan:   Problem List Items Addressed This Visit    Diabetes mellitus type 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 Increase exercise as tolerated. Continue current meds      HYPERCHOLESTEROLEMIA    Encouraged heart healthy diet, increase exercise, avoid trans fats, consider a krill oil cap daily      Essential hypertension    Well controlled, no changes to meds. Encouraged heart healthy diet such as the DASH diet and exercise as tolerated.       Ear pain    Left otitis externa. Started on ear drops and let us know if no improvement       Other Visit Diagnoses   None.     I am having Ms. Amadi start on ranitidine and neomycin-polymyxin-hydrocortisone. I am also having her maintain her multivitamin, cholecalciferol, ondansetron, acetaminophen, aspirin, calcium-vitamin D, vitamin E, Lancets, zoster vaccine live (PF), esomeprazole, dicyclomine, glucose blood, metoCLOPramide, FOLBIC, metFORMIN, rosuvastatin, ALPRAZolam, valsartan, potassium chloride, metoprolol, and amLODipine.  Meds ordered this encounter  Medications  . ranitidine (ZANTAC) 300 MG tablet    Sig: Take 1 tablet (300 mg total) by mouth at bedtime.    Dispense:  30 tablet     Refill:  5  . neomycin-polymyxin-hydrocortisone (CORTISPORIN) otic solution    Sig: Place 3 drops into both ears 3 (three) times daily.    Dispense:  10 mL    Refill:  0     Penni Homans, MD

## 2015-09-15 NOTE — Assessment & Plan Note (Signed)
Encouraged heart healthy diet, increase exercise, avoid trans fats, consider a krill oil cap daily 

## 2015-09-15 NOTE — Assessment & Plan Note (Signed)
Left otitis externa. Started on ear drops and let us know if no improvement

## 2015-09-15 NOTE — Assessment & Plan Note (Signed)
hgba1c acceptable, minimize simple carbs. Increase exercise as tolerated. Continue current meds 

## 2015-09-15 NOTE — Assessment & Plan Note (Signed)
Well controlled, no changes to meds. Encouraged heart healthy diet such as the DASH diet and exercise as tolerated.  °

## 2015-10-08 ENCOUNTER — Ambulatory Visit: Payer: Medicare Other | Admitting: Family Medicine

## 2015-10-08 ENCOUNTER — Telehealth: Payer: Self-pay | Admitting: Family Medicine

## 2015-10-08 NOTE — Telephone Encounter (Signed)
Still too early for her 3 month surveillance labs ie vit D etc. Last drawn on 7/18 so cannot draw til 10/18. See if there is something new she wants checked

## 2015-10-08 NOTE — Telephone Encounter (Signed)
Called left msg. To call back 

## 2015-10-08 NOTE — Telephone Encounter (Signed)
Relation to [id number] Call back number: [phone]   Reason for call:  Patient [facility] 10/08/15 to 11/11/2015 requesting pre visit labs. Please advise

## 2015-10-12 NOTE — Telephone Encounter (Signed)
Called left message to call back 

## 2015-10-13 NOTE — Telephone Encounter (Signed)
Called the patient left message, unable to speak to the patient.

## 2015-11-08 ENCOUNTER — Encounter: Payer: Self-pay | Admitting: Hematology & Oncology

## 2015-11-08 ENCOUNTER — Ambulatory Visit (HOSPITAL_BASED_OUTPATIENT_CLINIC_OR_DEPARTMENT_OTHER): Payer: Medicare Other | Admitting: Hematology & Oncology

## 2015-11-08 ENCOUNTER — Other Ambulatory Visit (HOSPITAL_BASED_OUTPATIENT_CLINIC_OR_DEPARTMENT_OTHER): Payer: Medicare Other

## 2015-11-08 VITALS — BP 132/58 | HR 67 | Temp 98.0°F | Resp 16 | Ht 61.0 in | Wt 173.8 lb

## 2015-11-08 DIAGNOSIS — D509 Iron deficiency anemia, unspecified: Secondary | ICD-10-CM

## 2015-11-08 DIAGNOSIS — K909 Intestinal malabsorption, unspecified: Secondary | ICD-10-CM

## 2015-11-08 DIAGNOSIS — E119 Type 2 diabetes mellitus without complications: Secondary | ICD-10-CM | POA: Diagnosis not present

## 2015-11-08 DIAGNOSIS — D508 Other iron deficiency anemias: Secondary | ICD-10-CM

## 2015-11-08 LAB — CBC WITH DIFFERENTIAL (CANCER CENTER ONLY)
BASO#: 0 10*3/uL (ref 0.0–0.2)
BASO%: 0.5 % (ref 0.0–2.0)
EOS ABS: 0.1 10*3/uL (ref 0.0–0.5)
EOS%: 0.8 % (ref 0.0–7.0)
HCT: 36.9 % (ref 34.8–46.6)
HEMOGLOBIN: 12.8 g/dL (ref 11.6–15.9)
LYMPH#: 2.4 10*3/uL (ref 0.9–3.3)
LYMPH%: 28 % (ref 14.0–48.0)
MCH: 29.6 pg (ref 26.0–34.0)
MCHC: 34.7 g/dL (ref 32.0–36.0)
MCV: 85 fL (ref 81–101)
MONO#: 0.7 10*3/uL (ref 0.1–0.9)
MONO%: 7.9 % (ref 0.0–13.0)
NEUT#: 5.4 10*3/uL (ref 1.5–6.5)
NEUT%: 62.8 % (ref 39.6–80.0)
PLATELETS: 228 10*3/uL (ref 145–400)
RBC: 4.33 10*6/uL (ref 3.70–5.32)
RDW: 12.2 % (ref 11.1–15.7)
WBC: 8.6 10*3/uL (ref 3.9–10.0)

## 2015-11-08 LAB — CMP (CANCER CENTER ONLY)
ALBUMIN: 3.9 g/dL (ref 3.3–5.5)
ALT(SGPT): 30 U/L (ref 10–47)
AST: 26 U/L (ref 11–38)
Alkaline Phosphatase: 63 U/L (ref 26–84)
BUN, Bld: 17 mg/dL (ref 7–22)
CHLORIDE: 100 meq/L (ref 98–108)
CO2: 23 mEq/L (ref 18–33)
Calcium: 9.8 mg/dL (ref 8.0–10.3)
Creat: 0.8 mg/dl (ref 0.6–1.2)
Glucose, Bld: 153 mg/dL — ABNORMAL HIGH (ref 73–118)
POTASSIUM: 4.3 meq/L (ref 3.3–4.7)
Sodium: 133 mEq/L (ref 128–145)
TOTAL PROTEIN: 7.4 g/dL (ref 6.4–8.1)
Total Bilirubin: 1 mg/dl (ref 0.20–1.60)

## 2015-11-08 NOTE — Progress Notes (Signed)
Hematology and Oncology Follow Up Visit  [name] [id number] [birth year] 75 y.o. 11/08/2015   Principle Diagnosis:   Iron deficiency anemia secondary to malabsorption  Current Therapy:    IV iron-patient received 1 dose back in January 2017     Interim History:  [name] is back for follow-up. She is  looking fairly good. She has had no problems since we last saw her. She's had no increase in fatigue or weakness. Her blood sugars have been doing pretty well. She's had her last hemoglobin A1c was 6.4.   She's had no obvious bleeding or bruising.   She's had no rashes. She's had no leg swelling. There's not been any change in medications.   She has had no weight loss or weight gain.   We last saw her in June, her ferritin was 228.  Overall, her performance status is ECOG 1.  Medications:  Current Outpatient Prescriptions:  .  acetaminophen (TYLENOL) 500 MG tablet, Take 2 tablets (1,000 mg total) by mouth every 6 (six) hours as needed for moderate pain., Disp: 30 tablet, Rfl: 0 .  ALPRAZolam (XANAX) 0.25 MG tablet, Take 1 tablet (0.25 mg total) by mouth 2 (two) times daily as needed for anxiety., Disp: 30 tablet, Rfl: 1 .  amLODipine (NORVASC) 5 MG tablet, Take 1 tablet by mouth  daily, Disp: 90 tablet, Rfl: 3 .  aspirin 81 MG tablet, Take 81 mg by mouth daily., Disp: , Rfl:  .  calcium-vitamin D (OSCAL WITH D) 500-200 MG-UNIT per tablet, Take 1 tablet by mouth., Disp: , Rfl:  .  cholecalciferol (VITAMIN D) 1000 UNITS tablet, Take 1,000 Units by mouth daily.  , Disp: , Rfl:  .  dicyclomine (BENTYL) 10 MG capsule, Take 1 capsule (10 mg total) by mouth 4 (four) times daily as needed., Disp: 120 capsule, Rfl: 6 .  esomeprazole (NEXIUM) 40 MG capsule, Take 1 capsule (40 mg total) by mouth daily before breakfast., Disp: 30 capsule, Rfl: 11 .  FOLBIC 2.5-25-2 MG TABS tablet, TAKE 1 TABLET BY MOUTH EVERY DAY, Disp: 90 tablet, Rfl: 3 .  glucose blood test strip, Take once  daily and as needed.  DX E11.9, Disp: 100 each, Rfl: 3 .  Lancets MISC, Use as instructed once daily to check blood sugar. DX E11.9 (Patient taking differently: Use as instructed twice daily to check blood sugar. DX E11.9), Disp: 200 each, Rfl: 2 .  metFORMIN (GLUCOPHAGE-XR) 500 MG 24 hr tablet, Take 1 tablet by mouth 3  times daily with meals, Disp: 270 tablet, Rfl: 1 .  metoCLOPramide (REGLAN) 5 MG tablet, Take 1 tablet by mouth two  times daily, Disp: 180 tablet, Rfl: 1 .  metoprolol (LOPRESSOR) 50 MG tablet, Take 1 tablet by mouth two  times daily, Disp: 180 tablet, Rfl: 3 .  Multiple Vitamin (MULTIVITAMIN) tablet, Take 1 tablet by mouth daily.  , Disp: , Rfl:  .  neomycin-polymyxin-hydrocortisone (CORTISPORIN) otic solution, Place 3 drops into both ears 3 (three) times daily., Disp: 10 mL, Rfl: 0 .  ondansetron (ZOFRAN) 4 MG tablet, Take 4 mg by mouth every 4 (four) hours as needed. For nausea  , Disp: , Rfl:  .  potassium chloride (K-DUR,KLOR-CON) 10 MEQ tablet, Take 1 tablet by mouth  daily, Disp: 90 tablet, Rfl: 0 .  ranitidine (ZANTAC) 300 MG tablet, Take 1 tablet (300 mg total) by mouth at bedtime., Disp: 30 tablet, Rfl: 5 .  rosuvastatin (CRESTOR) 10 MG tablet, Take 0.5 tablets (5  mg total) by mouth daily., Disp: 45 tablet, Rfl: 1 .  valsartan (DIOVAN) 320 MG tablet, Take 1 tablet by mouth  daily, Disp: 90 tablet, Rfl: 3 .  vitamin E 400 UNIT capsule, Take 400 Units by mouth daily., Disp: , Rfl:  .  zoster vaccine live, PF, (ZOSTAVAX) 16109 UNT/0.65ML injection, Inject 19,400 Units into the skin once., Disp: 1 each, Rfl: 0  Allergies:  Allergies  Allergen Reactions  . Tramadol     Dizziness     Past Medical History, Surgical history, Social history, and Family History were reviewed and updated.  Review of Systems: As above  Physical Exam:  height is 5\' 1"  (1.549 m) and weight is 173 lb 12.8 oz (78.8 kg). Her oral temperature is 98 F (36.7 C). Her blood pressure is 132/58  (abnormal) and her pulse is 67. Her respiration is 16.   Wt Readings from Last 3 Encounters:  11/08/15 173 lb 12.8 oz (78.8 kg)  08/26/15 172 lb 4 oz (78.1 kg)  07/08/15 175 lb (79.4 kg)     Well-developed well-nourished white female in no obvious distress. Head and neck exam shows no ocular or oral lesions. There are no palpable cervical or supraclavicular lymph nodes. Lungs are clear. Cardiac exam regular rate and rhythm with no murmurs, rubs or bruits. Abdomen is soft. She has good bowel sounds. There is no fluid wave. There is no palpable liver or spleen tip. Back exam shows no tenderness over the spine, ribs or hips. Extremities shows no clubbing, cyanosis or edema. Skin exam shows no rashes, ecchymosis or petechia. Neurological exam shows no focal neurological deficits.  Lab Results  Component Value Date   WBC 8.6 11/08/2015   HGB 12.8 11/08/2015   HCT 36.9 11/08/2015   MCV 85 11/08/2015   PLT 228 11/08/2015     Chemistry      Component Value Date/Time   NA 133 11/08/2015 1409   NA 134 (L) 07/08/2015 1149   K 4.3 11/08/2015 1409   K 4.4 07/08/2015 1149   CL 100 11/08/2015 1409   CO2 23 11/08/2015 1409   CO2 25 07/08/2015 1149   BUN 17 11/08/2015 1409   BUN 15.5 07/08/2015 1149   CREATININE 0.8 11/08/2015 1409   CREATININE 0.8 07/08/2015 1149      Component Value Date/Time   CALCIUM 9.8 11/08/2015 1409   CALCIUM 10.0 07/08/2015 1149   ALKPHOS 63 11/08/2015 1409   ALKPHOS 62 07/08/2015 1149   AST 26 11/08/2015 1409   AST 24 07/08/2015 1149   ALT 30 11/08/2015 1409   ALT 30 07/08/2015 1149   BILITOT 1.00 11/08/2015 1409   BILITOT 0.64 07/08/2015 1149         Impression and Plan: [name] is 75 year old white female. She has iron deficiency. I suspect that this is secondary to malabsorption. She's had stools tested which were negative for blood. I think because of her diabetes, she Probably is not absorbing all that well.  I looked at her blood and the  microscope. It looked pretty good. I do not see much in way of hypochromic or microcytic red blood cells. Her MCV was stable.  We will plan to get her back in another 6 months.   She has a very strong faith. I am very much impressed by this.    Volanda Napoleon, MD 10/2/20173:44 PM.

## 2015-11-09 LAB — FERRITIN: Ferritin: 195 ng/ml (ref 9–269)

## 2015-11-09 LAB — IRON AND TIBC
%SAT: 19 % — AB (ref 21–57)
Iron: 63 ug/dL (ref 41–142)
TIBC: 338 ug/dL (ref 236–444)
UIBC: 275 ug/dL (ref 120–384)

## 2015-11-09 LAB — RETICULOCYTES: Reticulocyte Count: 1.3 % (ref 0.6–2.6)

## 2015-11-10 ENCOUNTER — Other Ambulatory Visit: Payer: Self-pay | Admitting: *Deleted

## 2015-11-10 ENCOUNTER — Telehealth: Payer: Self-pay | Admitting: *Deleted

## 2015-11-10 ENCOUNTER — Inpatient Hospital Stay (HOSPITAL_COMMUNITY): Admission: RE | Admit: 2015-11-10 | Payer: Medicare Other | Source: Ambulatory Visit

## 2015-11-10 DIAGNOSIS — K909 Intestinal malabsorption, unspecified: Secondary | ICD-10-CM

## 2015-11-10 NOTE — Telephone Encounter (Signed)
-----   Message from Volanda Napoleon, MD sent at 11/09/2015  1:33 PM EDT ----- Call - your iron is dropping!!  One dose of Feraheme will help for the next 4 months.  pete

## 2015-11-11 ENCOUNTER — Ambulatory Visit (INDEPENDENT_AMBULATORY_CARE_PROVIDER_SITE_OTHER): Payer: Medicare Other | Admitting: Family Medicine

## 2015-11-11 ENCOUNTER — Encounter: Payer: Self-pay | Admitting: Family Medicine

## 2015-11-11 VITALS — BP 132/68 | HR 66 | Temp 97.6°F | Wt 174.8 lb

## 2015-11-11 DIAGNOSIS — R109 Unspecified abdominal pain: Secondary | ICD-10-CM

## 2015-11-11 DIAGNOSIS — D508 Other iron deficiency anemias: Secondary | ICD-10-CM

## 2015-11-11 DIAGNOSIS — Z23 Encounter for immunization: Secondary | ICD-10-CM

## 2015-11-11 DIAGNOSIS — E559 Vitamin D deficiency, unspecified: Secondary | ICD-10-CM | POA: Diagnosis not present

## 2015-11-11 DIAGNOSIS — K219 Gastro-esophageal reflux disease without esophagitis: Secondary | ICD-10-CM

## 2015-11-11 DIAGNOSIS — E1169 Type 2 diabetes mellitus with other specified complication: Secondary | ICD-10-CM

## 2015-11-11 DIAGNOSIS — E669 Obesity, unspecified: Secondary | ICD-10-CM

## 2015-11-11 DIAGNOSIS — E7800 Pure hypercholesterolemia, unspecified: Secondary | ICD-10-CM

## 2015-11-11 DIAGNOSIS — I10 Essential (primary) hypertension: Secondary | ICD-10-CM

## 2015-11-11 NOTE — Patient Instructions (Signed)
Encouraged moist heat and gentle stretching as tolerated. May try NSAIDs and prescription meds as directed and report if symptoms worsen or seek immediate care Try pain patches such as Salon Pas, ASpercreme and [employer] they have new ones with Lidocaine.   Back Pain, Adult Back pain is very common in adults.The cause of back pain is rarely dangerous and the pain often gets better over time.The cause of your back pain may not be known. Some common causes of back pain include:  Strain of the muscles or ligaments supporting the spine.  Wear and tear (degeneration) of the spinal disks.  Arthritis.  Direct injury to the back. For many people, back pain may return. Since back pain is rarely dangerous, most people can learn to manage this condition on their own. HOME CARE INSTRUCTIONS Watch your back pain for any changes. The following actions may help to lessen any discomfort you are feeling:  Remain active. It is stressful on your back to sit or stand in one place for long periods of time. Do not sit, drive, or stand in one place for more than 30 minutes at a time. Take short walks on even surfaces as soon as you are able.Try to increase the length of time you walk each day.  Exercise regularly as directed by your health care provider. Exercise helps your back heal faster. It also helps avoid future injury by keeping your muscles strong and flexible.  Do not stay in bed.Resting more than 1-2 days can delay your recovery.  Pay attention to your body when you bend and lift. The most comfortable positions are those that put less stress on your recovering back. Always use proper lifting techniques, including:  Bending your knees.  Keeping the load close to your body.  Avoiding twisting.  Find a comfortable position to sleep. Use a firm mattress and lie on your side with your knees slightly bent. If you lie on your back, put a pillow under your knees.  Avoid feeling anxious or  stressed.Stress increases muscle tension and can worsen back pain.It is important to recognize when you are anxious or stressed and learn ways to manage it, such as with exercise.  Take medicines only as directed by your health care provider. Over-the-counter medicines to reduce pain and inflammation are often the most helpful.Your health care provider may prescribe muscle relaxant drugs.These medicines help dull your pain so you can more quickly return to your normal activities and healthy exercise.  Apply ice to the injured area:  Put ice in a plastic bag.  Place a towel between your skin and the bag.  Leave the ice on for 20 minutes, 2-3 times a day for the first 2-3 days. After that, ice and heat may be alternated to reduce pain and spasms.  Maintain a healthy weight. Excess weight puts extra stress on your back and makes it difficult to maintain good posture. SEEK MEDICAL CARE IF:  You have pain that is not relieved with rest or medicine.  You have increasing pain going down into the legs or buttocks.  You have pain that does not improve in one week.  You have night pain.  You lose weight.  You have a fever or chills. SEEK IMMEDIATE MEDICAL CARE IF:   You develop new bowel or bladder control problems.  You have unusual weakness or numbness in your arms or legs.  You develop nausea or vomiting.  You develop abdominal pain.  You feel faint.   This information  is not intended to replace advice given to you by your health care provider. Make sure you discuss any questions you have with your health care provider.   Document Released: 01/23/2005 Document Revised: 02/13/2014 Document Reviewed: 05/27/2013 Elsevier Interactive Patient Education 2016 [employer].

## 2015-11-11 NOTE — Progress Notes (Signed)
Pre visit review using our clinic review tool, if applicable. No additional management support is needed unless otherwise documented below in the visit note. 

## 2015-11-12 LAB — URINALYSIS, ROUTINE W REFLEX MICROSCOPIC
BILIRUBIN URINE: NEGATIVE
Ketones, ur: NEGATIVE
LEUKOCYTES UA: NEGATIVE
NITRITE: NEGATIVE
Specific Gravity, Urine: <=1.005 — AB (ref 1.000–1.030)
TOTAL PROTEIN, URINE-UPE24: NEGATIVE
Urine Glucose: NEGATIVE
Urobilinogen, UA: 0.2 (ref 0.0–1.0)
WBC UA: NONE SEEN (ref 0–?)
pH: 5.5 (ref 5.0–8.0)

## 2015-11-12 LAB — VITAMIN D 25 HYDROXY (VIT D DEFICIENCY, FRACTURES): VITD: 55.82 ng/mL (ref 30.00–100.00)

## 2015-11-13 LAB — URINE CULTURE

## 2015-11-13 NOTE — Assessment & Plan Note (Signed)
Well controlled, no changes to meds. Encouraged heart healthy diet such as the DASH diet and exercise as tolerated.  °

## 2015-11-13 NOTE — Assessment & Plan Note (Signed)
hgba1c acceptable, minimize simple carbs. Increase exercise as tolerated. Continue current meds 

## 2015-11-13 NOTE — Assessment & Plan Note (Signed)
Avoid offending foods, start probiotics. Do not eat large meals in late evening and consider raising head of bed.  

## 2015-11-13 NOTE — Assessment & Plan Note (Signed)
Following with hematology.

## 2015-11-13 NOTE — Progress Notes (Signed)
[name], female   DOB: [birth year], 75 y.o.   MRN: [id number]   Subjective:    [name], female    DOB: March 17, 1940, 75 y.o.   MRN: [id number]  Chief Complaint  Patient presents with  . Follow-up    HPI Patient is in today for follow up. She is feeling better today. No recent illness or hospitalization. Notes an increase in blood sugar into the 200s. Denies dietary changes. No polydipsia. Does endorse some urinary frequency. Denies CP/palp/SOB/HA/congestion/fevers/GI c/o. Taking meds as prescribed  Past Medical History:  Diagnosis Date  . Anemia 06/08/2014  . Anxiety   . Arthritis    Spinal Osteoarthritis  . Cancer ([place])   . Carcinoid tumor of stomach   . Cataract   . Chest pain    Myoview 12/15 no ischemia.  . Chronic kidney disease    Left kidney smaller than right kidney  . Diabetes mellitus type 2 in obese ([place]) 09/05/2006   Qualifier: Diagnosis of  By: Marca Ancona RMA, Lucy    . Diabetic peripheral neuropathy ([place]) 10/29/2013  . Diverticulosis 08/30/2000   Colonoscopy   . Encounter for preventative adult health care exam with abnormal findings 09/14/2013  . Esophageal reflux   . Gastric polyp    Fundic Gland  . Gastroparesis   . Headache(784.0)   . Heart murmur    Echocardiogram 2/11: EF 60-65%, mild LAE, grade 1 diastolic dysfunction, aortic valve sclerosis, mean gradient 9 mm of mercury, PASP 34  . Iron deficiency anemia, unspecified   . 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 Leg swelling    bilateral  . Neck pain 04/22/2015  . PONV (postoperative nausea and vomiting)    pt states only needs small amount of anesthesia  . PSVT (paroxysmal supraventricular tachycardia) ([place])   . Pure hypercholesterolemia   . Stroke ([place])    tia, 2014  . 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 Type II or unspecified type diabetes mellitus without mention of complication, not stated as uncontrolled   . Unspecified essential hypertension   . Unspecified hereditary and  idiopathic peripheral neuropathy 10/29/2013    Past Surgical History:  Procedure Laterality Date  . CHOLECYSTECTOMY  1993  . COLONOSCOPY  11/11/2010   diverticulosis  . DILATATION & CURRETTAGE/HYSTEROSCOPY WITH RESECTOCOPE N/A 02/25/2013   Procedure: Attempted hysteroscopy with uterine perforation;  Surgeon: Jamey Reas de Berton Lan, MD;  Location: [facility];  Service: Gynecology;  Laterality: N/A;  . ESOPHAGOGASTRODUODENOSCOPY  08/29/2010; 09/15/2010   Carcinoid tumor less than 1 cm in July 2012 not seen in August 2012 , gastritis, fundic gland polyps  . ESOPHAGOGASTRODUODENOSCOPY  05/16/2011  . ESOPHAGOGASTRODUODENOSCOPY  06/14/2012  . EUS  12/15/2010   Procedure: UPPER ENDOSCOPIC ULTRASOUND (EUS) LINEAR;  Surgeon: Owens Loffler, MD;  Location: [facility];  Service: Endoscopy;  Laterality: N/A;  . EYE SURGERY Bilateral    Bi lateral cateracts and bi lateral laser  . LAPAROSCOPY N/A 02/25/2013   Procedure: Cystoscopy and laparoscopy with fulguration of uterine serosa;  Surgeon: Jamey Reas de Berton Lan, MD;  Location: [facility];  Service: Gynecology;  Laterality: N/A;  . TONSILLECTOMY      Family History  Problem Relation Age of Onset  . Diabetes Mother   . Stroke Father     deceased age 68  . Heart disease Sister     deceased MI age 39  . Heart disease Brother     deceased MI age 1  .  Diabetes Maternal Grandmother   . Hypertension Paternal Grandmother   . Diabetes Sister   . Heart disease Sister   . Hypertension Sister   . Hyperlipidemia Sister   . Diabetes Sister   . Heart disease Sister   . Hypertension Sister   . Hyperlipidemia Sister   . Diabetes Brother   . Heart disease Brother   . Hypertension Brother   . Hyperlipidemia Brother   . Colon cancer Neg Hx   . Esophageal cancer Neg Hx   . Stomach cancer Neg Hx   . Rectal cancer Neg Hx     Social History   Social History  . Marital status: Married    Spouse name: N/A  . Number of children: 0  .  Years of education: college   Occupational History  . Retail    Social History Main Topics  . Smoking status: Never Smoker  . Smokeless tobacco: Never Used     Comment: Never used tobacco  . Alcohol use No  . Drug use: No  . Sexual activity: No     Comment: lives alone, no dietary restrictions except avoid fresh veg, fruit, whole grains   Other Topics Concern  . Not on file   Social History Narrative   Patient was married ([name]) - widow   Patient does not have any children.   Patient is right-handed.   Patient has a BA degree.   One caffeine drink daily     Outpatient Medications Prior to Visit  Medication Sig Dispense Refill  . acetaminophen (TYLENOL) 500 MG tablet Take 2 tablets (1,000 mg total) by mouth every 6 (six) hours as needed for moderate pain. 30 tablet 0  . ALPRAZolam (XANAX) 0.25 MG tablet Take 1 tablet (0.25 mg total) by mouth 2 (two) times daily as needed for anxiety. 30 tablet 1  . amLODipine (NORVASC) 5 MG tablet Take 1 tablet by mouth  daily 90 tablet 3  . aspirin 81 MG tablet Take 81 mg by mouth daily.    . calcium-vitamin D (OSCAL WITH D) 500-200 MG-UNIT per tablet Take 1 tablet by mouth.    . cholecalciferol (VITAMIN D) 1000 UNITS tablet Take 1,000 Units by mouth daily.      Marland Kitchen dicyclomine (BENTYL) 10 MG capsule Take 1 capsule (10 mg total) by mouth 4 (four) times daily as needed. 120 capsule 6  . esomeprazole (NEXIUM) 40 MG capsule Take 1 capsule (40 mg total) by mouth daily before breakfast. 30 capsule 11  . FOLBIC 2.5-25-2 MG TABS tablet TAKE 1 TABLET BY MOUTH EVERY DAY 90 tablet 3  . glucose blood test strip Take once daily and as needed.  DX E11.9 100 each 3  . Lancets MISC Use as instructed once daily to check blood sugar. DX E11.9 (Patient taking differently: Use as instructed twice daily to check blood sugar. DX E11.9) 200 each 2  . metFORMIN (GLUCOPHAGE-XR) 500 MG 24 hr tablet Take 1 tablet by mouth 3  times daily with meals 270 tablet 1  .  metoCLOPramide (REGLAN) 5 MG tablet Take 1 tablet by mouth two  times daily 180 tablet 1  . metoprolol (LOPRESSOR) 50 MG tablet Take 1 tablet by mouth two  times daily 180 tablet 3  . Multiple Vitamin (MULTIVITAMIN) tablet Take 1 tablet by mouth daily.      Marland Kitchen neomycin-polymyxin-hydrocortisone (CORTISPORIN) otic solution Place 3 drops into both ears 3 (three) times daily. 10 mL 0  . ondansetron (ZOFRAN) 4 MG tablet Take  4 mg by mouth every 4 (four) hours as needed. For nausea      . potassium chloride (K-DUR,KLOR-CON) 10 MEQ tablet Take 1 tablet by mouth  daily 90 tablet 0  . ranitidine (ZANTAC) 300 MG tablet Take 1 tablet (300 mg total) by mouth at bedtime. 30 tablet 5  . rosuvastatin (CRESTOR) 10 MG tablet Take 0.5 tablets (5 mg total) by mouth daily. 45 tablet 1  . valsartan (DIOVAN) 320 MG tablet Take 1 tablet by mouth  daily 90 tablet 3  . vitamin E 400 UNIT capsule Take 400 Units by mouth daily.    Marland Kitchen zoster vaccine live, PF, (ZOSTAVAX) 17001 UNT/0.65ML injection Inject 19,400 Units into the skin once. 1 each 0   No facility-administered medications prior to visit.     Allergies  Allergen Reactions  . Tramadol     Dizziness     Review of Systems  Constitutional: Negative for fever and malaise/fatigue.  HENT: Negative for congestion.   Eyes: Negative for blurred vision.  Respiratory: Negative for shortness of breath.   Cardiovascular: Negative for chest pain, palpitations and leg swelling.  Gastrointestinal: Negative for abdominal pain, blood in stool and nausea.  Genitourinary: Negative for dysuria and frequency.  Musculoskeletal: Negative for falls.  Skin: Negative for rash.  Neurological: Negative for dizziness, loss of consciousness and headaches.  Endo/Heme/Allergies: Negative for environmental allergies.  Psychiatric/Behavioral: Negative for depression. The patient is not nervous/anxious.        Objective:    Physical Exam  Constitutional: She is oriented to person,  place, and time. She appears well-developed and well-nourished. No distress.  HENT:  Head: Normocephalic and atraumatic.  Nose: Nose normal.  Eyes: Right eye exhibits no discharge. Left eye exhibits no discharge.  Neck: Normal range of motion. Neck supple.  Cardiovascular: Normal rate and regular rhythm.   No murmur heard. Pulmonary/Chest: Effort normal and breath sounds normal.  Abdominal: Soft. Bowel sounds are normal. There is no tenderness.  Musculoskeletal: She exhibits no edema.  Neurological: She is alert and oriented to person, place, and time.  Skin: Skin is warm and dry.  Psychiatric: She has a normal mood and affect.  Nursing note and vitals reviewed.   BP 132/68 (BP Location: Left Arm, Patient Position: Sitting, Cuff Size: Normal)   Pulse 66   Temp 97.6 F (36.4 C) (Oral)   Wt 174 lb 12.8 oz (79.3 kg)   LMP 02/07/1992   SpO2 98%   BMI 33.03 kg/m  Wt Readings from Last 3 Encounters:  11/11/15 174 lb 12.8 oz (79.3 kg)  11/08/15 173 lb 12.8 oz (78.8 kg)  08/26/15 172 lb 4 oz (78.1 kg)     Lab Results  Component Value Date   WBC 8.6 11/08/2015   HGB 12.8 11/08/2015   HCT 36.9 11/08/2015   PLT 228 11/08/2015   GLUCOSE 153 (H) 11/08/2015   CHOL 193 08/24/2015   TRIG 117.0 08/24/2015   HDL 61.50 08/24/2015   LDLDIRECT 123.7 04/13/2011   LDLCALC 108 (H) 08/24/2015   ALT 30 11/08/2015   AST 26 11/08/2015   NA 133 11/08/2015   K 4.3 11/08/2015   CL 100 11/08/2015   CREATININE 0.8 11/08/2015   BUN 17 11/08/2015   CO2 23 11/08/2015   TSH 1.97 08/24/2015   INR 0.88 01/16/2011   HGBA1C 6.3 08/24/2015   MICROALBUR 1.6 11/13/2014    Lab Results  Component Value Date   TSH 1.97 08/24/2015   Lab Results  Component  Value Date   WBC 8.6 11/08/2015   HGB 12.8 11/08/2015   HCT 36.9 11/08/2015   MCV 85 11/08/2015   PLT 228 11/08/2015   Lab Results  Component Value Date   NA 133 11/08/2015   K 4.3 11/08/2015   CHLORIDE 101 07/08/2015   CO2 23 11/08/2015    GLUCOSE 153 (H) 11/08/2015   BUN 17 11/08/2015   CREATININE 0.8 11/08/2015   BILITOT 1.00 11/08/2015   ALKPHOS 63 11/08/2015   AST 26 11/08/2015   ALT 30 11/08/2015   PROT 7.4 11/08/2015   ALBUMIN 3.9 11/08/2015   CALCIUM 9.8 11/08/2015   ANIONGAP 8 07/08/2015   EGFR 75 (L) 07/08/2015   GFR 94.60 08/24/2015   Lab Results  Component Value Date   CHOL 193 08/24/2015   Lab Results  Component Value Date   HDL 61.50 08/24/2015   Lab Results  Component Value Date   LDLCALC 108 (H) 08/24/2015   Lab Results  Component Value Date   TRIG 117.0 08/24/2015   Lab Results  Component Value Date   CHOLHDL 3 08/24/2015   Lab Results  Component Value Date   HGBA1C 6.3 08/24/2015       Assessment & Plan:   Problem List Items Addressed This Visit    Diabetes mellitus type 2 in obese ([place])    hgba1c acceptable, minimize simple carbs. Increase exercise as tolerated. Continue current meds      Relevant Orders   Hemoglobin A1c   Comprehensive metabolic panel   HYPERCHOLESTEROLEMIA   Relevant Orders   Lipid panel   Essential hypertension    Well controlled, no changes to meds. Encouraged heart healthy diet such as the DASH diet and exercise as tolerated.       Relevant Orders   TSH   Comprehensive metabolic panel   Vitamin D deficiency   Relevant Orders   Vitamin D (25 hydroxy) (Completed)   GERD (gastroesophageal reflux disease)    Avoid offending foods, start probiotics. Do not eat large meals in late evening and consider raising head of bed.       Iron deficiency anemia    Following with hematology      Relevant Orders   CBC    Other Visit Diagnoses    Abdominal pain, unspecified abdominal location    -  Primary   Relevant Orders   Urinalysis   Urine culture (Completed)   Encounter for immunization       Relevant Orders   Flu vaccine HIGH DOSE PF (Completed)   Urinalysis   Urine culture (Completed)      I am having Ms. Michalski maintain her  multivitamin, cholecalciferol, ondansetron, acetaminophen, aspirin, calcium-vitamin D, vitamin E, Lancets, zoster vaccine live (PF), esomeprazole, dicyclomine, glucose blood, metoCLOPramide, FOLBIC, metFORMIN, rosuvastatin, ALPRAZolam, valsartan, potassium chloride, metoprolol, amLODipine, ranitidine, and neomycin-polymyxin-hydrocortisone.  No orders of the defined types were placed in this encounter.    Penni Homans, MD

## 2015-11-15 ENCOUNTER — Ambulatory Visit (AMBULATORY_SURGERY_CENTER): Payer: Self-pay

## 2015-11-15 VITALS — Ht 61.0 in | Wt 174.8 lb

## 2015-11-15 DIAGNOSIS — K317 Polyp of stomach and duodenum: Secondary | ICD-10-CM

## 2015-11-15 NOTE — Progress Notes (Signed)
No allergies to eggs or soy No past problems with anesthesia No diet meds No home oxygen  Declined emmi; internet" not working now, something wrong with my computer"

## 2015-11-16 ENCOUNTER — Ambulatory Visit (HOSPITAL_BASED_OUTPATIENT_CLINIC_OR_DEPARTMENT_OTHER): Payer: Medicare Other

## 2015-11-16 DIAGNOSIS — K909 Intestinal malabsorption, unspecified: Secondary | ICD-10-CM

## 2015-11-16 DIAGNOSIS — D509 Iron deficiency anemia, unspecified: Secondary | ICD-10-CM

## 2015-11-16 MED ORDER — SODIUM CHLORIDE 0.9 % IV SOLN
Freq: Once | INTRAVENOUS | Status: AC
  Administered 2015-11-16: 14:00:00 via INTRAVENOUS

## 2015-11-16 MED ORDER — SODIUM CHLORIDE 0.9 % IV SOLN
510.0000 mg | Freq: Once | INTRAVENOUS | Status: AC
  Administered 2015-11-16: 510 mg via INTRAVENOUS
  Filled 2015-11-16: qty 17

## 2015-11-16 NOTE — Patient Instructions (Signed)

## 2015-11-22 NOTE — Progress Notes (Signed)
HPI The patient presents for followup of SVT.   Since I last saw her she has had had one episode of tachycardia palpitations and July. She called our office. Reported the rate was in the 150s and her blood pressure was markedly elevated. She did do some vagal maneuvers and things slowly resolved. She was instructed to go to the emergency room but she didn't because she was having no further symptoms. She's had none of these since then. She does say she feels her heart beating hard in her chest but she's not describing it as fast or irregular. She's not had any presyncope or syncope. She's had no chest pressure, neck or arm discomfort. Unfortunately she still seems to be quite sad since the death of her husband. She is doing only a little walking.     Allergies  Allergen Reactions  . Tramadol     Dizziness     Current Outpatient Prescriptions  Medication Sig Dispense Refill  . acetaminophen (TYLENOL) 500 MG tablet Take 2 tablets (1,000 mg total) by mouth every 6 (six) hours as needed for moderate pain. 30 tablet 0  . ALPRAZolam (XANAX) 0.25 MG tablet Take 1 tablet (0.25 mg total) by mouth 2 (two) times daily as needed for anxiety. 30 tablet 1  . amLODipine (NORVASC) 5 MG tablet Take 1 tablet by mouth  daily 90 tablet 3  . aspirin 81 MG tablet Take 81 mg by mouth daily.    . calcium-vitamin D (OSCAL WITH D) 500-200 MG-UNIT per tablet Take 1 tablet by mouth.    . cholecalciferol (VITAMIN D) 1000 UNITS tablet Take 1,000 Units by mouth daily.      Marland Kitchen dicyclomine (BENTYL) 10 MG capsule Take 1 capsule (10 mg total) by mouth 4 (four) times daily as needed. 120 capsule 6  . esomeprazole (NEXIUM) 40 MG capsule Take 1 capsule (40 mg total) by mouth daily before breakfast. 30 capsule 11  . FOLBIC 2.5-25-2 MG TABS tablet TAKE 1 TABLET BY MOUTH EVERY DAY 90 tablet 3  . glucose blood test strip Take once daily and as needed.  DX E11.9 100 each 3  . Lancets MISC Use as instructed once daily to check blood  sugar. DX E11.9 (Patient taking differently: Use as instructed twice daily to check blood sugar. DX E11.9) 200 each 2  . metFORMIN (GLUCOPHAGE-XR) 500 MG 24 hr tablet Take 1 tablet by mouth 3  times daily with meals 270 tablet 1  . metoCLOPramide (REGLAN) 5 MG tablet Take 1 tablet by mouth two  times daily 180 tablet 1  . metoprolol (LOPRESSOR) 50 MG tablet Take 75 mg in the morning and 50 mg in the evening 225 tablet 3  . Multiple Vitamin (MULTIVITAMIN) tablet Take 1 tablet by mouth daily.      . ondansetron (ZOFRAN) 4 MG tablet Take 4 mg by mouth every 4 (four) hours as needed. For nausea      . potassium chloride (K-DUR,KLOR-CON) 10 MEQ tablet Take 1 tablet by mouth  daily 90 tablet 0  . rosuvastatin (CRESTOR) 10 MG tablet Take 0.5 tablets (5 mg total) by mouth daily. 45 tablet 1  . valsartan (DIOVAN) 320 MG tablet Take 1 tablet by mouth  daily 90 tablet 3  . vitamin E 400 UNIT capsule Take 400 Units by mouth daily.    Marland Kitchen zoster vaccine live, PF, (ZOSTAVAX) 16109 UNT/0.65ML injection Inject 19,400 Units into the skin once. 1 each 0   No current facility-administered medications for  this visit.     Past Medical History:  Diagnosis Date  . Anemia 06/08/2014  . Anxiety   . Arthritis    Spinal Osteoarthritis  . Cancer ([place])   . Carcinoid tumor of stomach   . Cataract   . Chest pain    Myoview 12/15 no ischemia.  . Chronic kidney disease    Left kidney smaller than right kidney  . Diabetes mellitus type 2 in obese ([place]) 09/05/2006   Qualifier: Diagnosis of  By: Marca Ancona RMA, Lucy    . Diabetic peripheral neuropathy ([place]) 10/29/2013  . Diverticulosis 08/30/2000   Colonoscopy   . Encounter for preventative adult health care exam with abnormal findings 09/14/2013  . Esophageal reflux   . Gastric polyp    Fundic Gland  . Gastroparesis   . Headache(784.0)   . Heart murmur    Echocardiogram 2/11: EF 60-65%, mild LAE, grade 1 diastolic dysfunction, aortic valve sclerosis, mean gradient 9 mm of  mercury, PASP 34  . Iron deficiency anemia, unspecified   . 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 Leg swelling    bilateral  . Neck pain 04/22/2015  . PONV (postoperative nausea and vomiting)    pt states only needs small amount of anesthesia  . PSVT (paroxysmal supraventricular tachycardia) ([place])   . Pure hypercholesterolemia   . Stroke ([place])    tia, 2014  . 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 Type II or unspecified type diabetes mellitus without mention of complication, not stated as uncontrolled   . Unspecified essential hypertension   . Unspecified hereditary and idiopathic peripheral neuropathy 10/29/2013    Past Surgical History:  Procedure Laterality Date  . CHOLECYSTECTOMY  1993  . COLONOSCOPY  11/11/2010   diverticulosis  . DILATATION & CURRETTAGE/HYSTEROSCOPY WITH RESECTOCOPE N/A 02/25/2013   Procedure: Attempted hysteroscopy with uterine perforation;  Surgeon: Jamey Reas de Berton Lan, MD;  Location: [facility];  Service: Gynecology;  Laterality: N/A;  . ESOPHAGOGASTRODUODENOSCOPY  08/29/2010; 09/15/2010   Carcinoid tumor less than 1 cm in July 2012 not seen in August 2012 , gastritis, fundic gland polyps  . ESOPHAGOGASTRODUODENOSCOPY  05/16/2011  . ESOPHAGOGASTRODUODENOSCOPY  06/14/2012  . EUS  12/15/2010   Procedure: UPPER ENDOSCOPIC ULTRASOUND (EUS) LINEAR;  Surgeon: Owens Loffler, MD;  Location: [facility];  Service: Endoscopy;  Laterality: N/A;  . EYE SURGERY Bilateral    Bi lateral cateracts and bi lateral laser  . LAPAROSCOPY N/A 02/25/2013   Procedure: Cystoscopy and laparoscopy with fulguration of uterine serosa;  Surgeon: Jamey Reas de Berton Lan, MD;  Location: [facility];  Service: Gynecology;  Laterality: N/A;  . TONSILLECTOMY      ROS:  As stated in the HPI and negative for all other systems.  PHYSICAL EXAM BP 136/74 (BP Location: Left Arm, Patient Position: Sitting, Cuff Size: Normal)   Pulse 70   Ht 5\' 1"  (1.549 m)   Wt 174 lb 4 oz (79 kg)   LMP 02/07/1992    SpO2 98%   BMI 32.92 kg/m  GENERAL:  Well appearing NECK:  No jugular venous distention, waveform within normal limits, carotid upstroke brisk and symmetric, no bruits, no thyromegaly LUNGS:  Clear to auscultation bilaterally BACK:  No CVA tenderness CHEST:  Unremarkable HEART:  PMI not displaced or sustained,S1 and S2 within normal limits, no S3, no S4, no clicks, no rubs, slight systolic murmur peaking and heard best at the right upper sternal border, no diastolicmurmurs ABD:  Flat, positive bowel sounds normal in  frequency in pitch, no bruits, no rebound, no guarding, no midline pulsatile mass, no hepatomegaly, no splenomegaly EXT:  2 plus pulses throughout, mild bilateral ankle edema, no cyanosis no clubbing   EKG:  Sinus rhythm, rate 69, axis within normal limits, intervals within normal limits, no acute ST-T wave changes. 11/24/2015   ASSESSMENT AND PLAN  Supraventricular Tachycardia - She's had one episode that did break with maneuvers. I am going to increase her metoprolol by adding another half tablets to her morning dose. She'll let me know she has any increasing symptoms.  Hypertension - The blood pressure is at target. No change in medications is indicated. We will continue with therapeutic lifestyle changes (TLC).  Dyslipidemia -  She's had her dose of Crestor reduced and has a good cholesterol profile. She does have some coronary calcium so I think that less than 100 is at target. We Lab Results  Component Value Date   CHOL 193 08/24/2015   TRIG 117.0 08/24/2015   HDL 61.50 08/24/2015   LDLCALC 108 (H) 08/24/2015   LDLDIRECT 123.7 04/13/2011    Carotid stenosis - She has some mild plaque in September of 2016.  No follow up is indicated.   Murmur - This is mild aortic stenosis. We will follow this clinically.

## 2015-11-23 ENCOUNTER — Encounter: Payer: Self-pay | Admitting: Cardiology

## 2015-11-23 ENCOUNTER — Ambulatory Visit (INDEPENDENT_AMBULATORY_CARE_PROVIDER_SITE_OTHER): Payer: Medicare Other | Admitting: Cardiology

## 2015-11-23 VITALS — BP 136/74 | HR 70 | Ht 61.0 in | Wt 174.2 lb

## 2015-11-23 DIAGNOSIS — I471 Supraventricular tachycardia: Secondary | ICD-10-CM | POA: Diagnosis not present

## 2015-11-23 MED ORDER — METOPROLOL TARTRATE 50 MG PO TABS: ORAL_TABLET | ORAL | 3 refills | Status: DC

## 2015-11-23 NOTE — Patient Instructions (Signed)
Medication Instructions:  INCREASE- Metoprolol 1 1/2 in the morning and 1 tablets in the evening  Labwork: None Ordered  Testing/Procedures: None Ordered  Follow-Up: Your physician wants you to follow-up in: 6 Months. You will receive a reminder letter in the mail two months in advance. If you don't receive a letter, please call our office to schedule the follow-up appointment.   Any Other Special Instructions Will Be Listed Below (If Applicable).   If you need a refill on your cardiac medications before your next appointment, please call your pharmacy.

## 2015-11-24 ENCOUNTER — Encounter: Payer: Self-pay | Admitting: Cardiology

## 2015-11-25 ENCOUNTER — Other Ambulatory Visit (INDEPENDENT_AMBULATORY_CARE_PROVIDER_SITE_OTHER): Payer: Medicare Other

## 2015-11-25 DIAGNOSIS — D508 Other iron deficiency anemias: Secondary | ICD-10-CM

## 2015-11-25 DIAGNOSIS — E7800 Pure hypercholesterolemia, unspecified: Secondary | ICD-10-CM | POA: Diagnosis not present

## 2015-11-25 DIAGNOSIS — I10 Essential (primary) hypertension: Secondary | ICD-10-CM

## 2015-11-25 DIAGNOSIS — E1169 Type 2 diabetes mellitus with other specified complication: Secondary | ICD-10-CM | POA: Diagnosis not present

## 2015-11-25 DIAGNOSIS — E669 Obesity, unspecified: Secondary | ICD-10-CM | POA: Diagnosis not present

## 2015-11-25 LAB — LIPID PANEL
CHOL/HDL RATIO: 3
CHOLESTEROL: 191 mg/dL (ref 0–200)
HDL: 63.2 mg/dL (ref >39.00)
LDL CALC: 101 mg/dL — AB (ref 0–99)
NONHDL: 128.17
Triglycerides: 134 mg/dL (ref 0.0–149.0)
VLDL: 26.8 mg/dL (ref 0.0–40.0)

## 2015-11-25 LAB — CBC
HEMATOCRIT: 38.1 % (ref 36.0–46.0)
HEMOGLOBIN: 13 g/dL (ref 12.0–15.0)
MCHC: 34.2 g/dL (ref 30.0–36.0)
MCV: 84.8 fl (ref 78.0–100.0)
PLATELETS: 224 10*3/uL (ref 150.0–400.0)
RBC: 4.49 Mil/uL (ref 3.87–5.11)
RDW: 12.9 % (ref 11.5–15.5)
WBC: 8 10*3/uL (ref 4.0–10.5)

## 2015-11-25 LAB — COMPREHENSIVE METABOLIC PANEL
ALBUMIN: 4.4 g/dL (ref 3.5–5.2)
ALK PHOS: 62 U/L (ref 39–117)
ALT: 34 U/L (ref 0–35)
AST: 28 U/L (ref 0–37)
BUN: 23 mg/dL (ref 6–23)
CALCIUM: 10 mg/dL (ref 8.4–10.5)
CHLORIDE: 100 meq/L (ref 96–112)
CO2: 28 mEq/L (ref 19–32)
Creatinine, Ser: 0.7 mg/dL (ref 0.40–1.20)
GFR: 86.79 mL/min (ref >60.00)
Glucose, Bld: 120 mg/dL — ABNORMAL HIGH (ref 70–99)
POTASSIUM: 4.2 meq/L (ref 3.5–5.1)
SODIUM: 135 meq/L (ref 135–145)
Total Bilirubin: 0.4 mg/dL (ref 0.2–1.2)
Total Protein: 7.5 g/dL (ref 6.0–8.3)

## 2015-11-25 LAB — HEMOGLOBIN A1C: Hgb A1c MFr Bld: 6.1 % (ref 4.6–6.5)

## 2015-11-25 LAB — TSH: TSH: 1.9 u[IU]/mL (ref 0.35–4.50)

## 2015-11-29 ENCOUNTER — Ambulatory Visit (AMBULATORY_SURGERY_CENTER): Payer: Medicare Other | Admitting: Internal Medicine

## 2015-11-29 ENCOUNTER — Encounter: Payer: Self-pay | Admitting: Internal Medicine

## 2015-11-29 VITALS — BP 144/79 | HR 68 | Temp 98.2°F | Resp 23 | Ht 61.0 in | Wt 174.0 lb

## 2015-11-29 DIAGNOSIS — K317 Polyp of stomach and duodenum: Secondary | ICD-10-CM | POA: Diagnosis not present

## 2015-11-29 LAB — GLUCOSE, CAPILLARY
GLUCOSE-CAPILLARY: 136 mg/dL — AB (ref 65–99)
Glucose-Capillary: 134 mg/dL — ABNORMAL HIGH (ref 65–99)

## 2015-11-29 MED ORDER — SODIUM CHLORIDE 0.9 % IV SOLN: 500.0000 mL | INTRAVENOUS | Status: DC

## 2015-11-29 NOTE — Progress Notes (Addendum)
Pt continues to feel better in recover, she has not burped or passes gas, pt rates pain 3/10 now,pt has hx of unknown origin of chest pain, has had cardiac workup with no dx, pt is in sinus rhythm, no changes in ekg,pt discharged home, pt verbalized understanding of s/s of complications-adm

## 2015-11-29 NOTE — Patient Instructions (Addendum)
   I removed one polyp today - looks benign. I will let you know pathology.  I appreciate the opportunity to care for you. Gatha Mayer, MD, FACG  YOU HAD AN ENDOSCOPIC PROCEDURE TODAY AT [facility]:   Refer to the procedure report that was given to you for any specific questions about what was found during the examination.  If the procedure report does not answer your questions, please call your gastroenterologist to clarify.  If you requested that your care partner not be given the details of your procedure findings, then the procedure report has been included in a sealed envelope for you to review at your convenience later.  YOU SHOULD EXPECT: Some feelings of bloating in the abdomen. Passage of more gas than usual.  Walking can help get rid of the air that was put into your GI tract during the procedure and reduce the bloating. If you had a lower endoscopy (such as a colonoscopy or flexible sigmoidoscopy) you may notice spotting of blood in your stool or on the toilet paper. If you underwent a bowel prep for your procedure, you may not have a normal bowel movement for a few days.  Please Note:  You might notice some irritation and congestion in your nose or some drainage.  This is from the oxygen used during your procedure.  There is no need for concern and it should clear up in a day or so.  SYMPTOMS TO REPORT IMMEDIATELY:    Following upper endoscopy (EGD)  Vomiting of blood or coffee ground material  New chest pain or pain under the shoulder blades  Painful or persistently difficult swallowing  New shortness of breath  Fever of 100F or higher  Black, tarry-looking stools  For urgent or emergent issues, a gastroenterologist can be reached at any hour by calling [phone].   DIET:  We do recommend a small meal at first, but then you may proceed to your regular diet.  Drink plenty of fluids but you should avoid alcoholic beverages for 24  hours.  ACTIVITY:  You should plan to take it easy for the rest of today and you should NOT DRIVE or use heavy machinery until tomorrow (because of the sedation medicines used during the test).    FOLLOW UP: Our staff will call the number listed on your records the next business day following your procedure to check on you and address any questions or concerns that you may have regarding the information given to you following your procedure. If we do not reach you, we will leave a message.  However, if you are feeling well and you are not experiencing any problems, there is no need to return our call.  We will assume that you have returned to your regular daily activities without incident.  If any biopsies were taken you will be contacted by phone or by letter within the next 1-3 weeks.  Please call us at [phone] if you have not heard about the biopsies in 3 weeks.    SIGNATURES/CONFIDENTIALITY: You and/or your care partner have signed paperwork which will be entered into your electronic medical record.  These signatures attest to the fact that that the information above on your After Visit Summary has been reviewed and is understood.  Full responsibility of the confidentiality of this discharge information lies with you and/or your care-partner.  Gastric polyp removed.   Wait pathology.

## 2015-11-29 NOTE — Op Note (Signed)
[place] Patient Name: [name] Procedure Date: 11/29/2015 10:09 AM MRN: [id number] Endoscopist: Gatha Mayer , MD Age: 75 Referring MD:  Date of Birth: August 03, 1940 Gender: Female Account #: [id number] Procedure:                Upper GI endoscopy Indications:              Gastric polyps, Follow-up of gastric polyps Medicines:                Propofol per Anesthesia, Monitored Anesthesia Care Procedure:                Pre-Anesthesia Assessment:                           - Prior to the procedure, a History and Physical                            was performed, and patient medications and                            allergies were reviewed. The patient's tolerance of                            previous anesthesia was also reviewed. The risks                            and benefits of the procedure and the sedation                            options and risks were discussed with the patient.                            All questions were answered, and informed consent                            was obtained. Prior Anticoagulants: The patient                            last took aspirin 1 day prior to the procedure. ASA                            Grade Assessment: II - A patient with mild systemic                            disease. After reviewing the risks and benefits,                            the patient was deemed in satisfactory condition to                            undergo the procedure.                           After obtaining informed consent, the endoscope was  passed under direct vision. Throughout the                            procedure, the patient's blood pressure, pulse, and                            oxygen saturations were monitored continuously. The                            Model GIF-[id number]) scope was introduced                            through the mouth, and advanced to the second part           of duodenum. The upper GI endoscopy was                            accomplished without difficulty. The patient                            tolerated the procedure well. Scope In: Scope Out: Findings:                 A single 6 mm semi-sessile polyp was found in the                            gastric body. The polyp was removed with a cold                            biopsy forceps. Resection and retrieval were                            complete. Verification of patient identification                            for the specimen was done. Estimated blood loss was                            minimal.                           Multiple diminutive sessile polyps were found in                            the gastric fundus and in the gastric body.                           Diffuse moderately erythematous mucosa without                            bleeding was found in the gastric antrum.                           The exam was otherwise without abnormality.  No other finidngs on retroflexion. Complications:            No immediate complications. Estimated Blood Loss:     Estimated blood loss was minimal. Impression:               - A single gastric polyp. Resected and retrieved.                           - Multiple gastric polyps.                           - Erythematous mucosa in the antrum.                           - The examination was otherwise normal.                           -                           NUMEROUS DIMINUTIVE POLYPS THAT ARE CONSISTENT WITH                            KNOWN FUNDIC GLAND POLYPS.                           ONE POLYP THAT WAS REMOVED SUSPECT HYPERPLASTIC OR                            OTHER Recommendation:           - Patient has a contact number available for                            emergencies. The signs and symptoms of potential                            delayed complications were discussed with the                             patient. Return to normal activities tomorrow.                            Written discharge instructions were provided to the                            patient.                           - Resume previous diet.                           - Continue present medications.                           - Await pathology results. Gatha Mayer, MD 11/29/2015 10:35:07 AM This report has been signed electronically.

## 2015-11-29 NOTE — Progress Notes (Signed)
Called to room to assist during endoscopic procedure.  Patient ID and intended procedure confirmed with present staff. Received instructions for my participation in the procedure from the performing physician.  

## 2015-11-29 NOTE — Progress Notes (Signed)
A/ox3 pleased with MAC, report to April RN 

## 2015-11-29 NOTE — Progress Notes (Addendum)
Pt c/o of chest pain and heaviness when getting to recovery pt rates pain 7/10, pt is in sinus rhythm, printed rhythm strip for chart,Dr notified, sat pt up and pt states pain is a little better 5/10 now, Dr assessed pt thinks it maybe air in stomach, Dr Jeannetta Nap pt is feeling better, no new orders-adm

## 2015-11-29 NOTE — Progress Notes (Signed)
Dental advisory given to patient 

## 2015-11-30 ENCOUNTER — Telehealth: Payer: Self-pay

## 2015-11-30 NOTE — Telephone Encounter (Signed)
  Follow up Call-  Call back number 11/29/2015 10/26/2014 08/13/2013  Post procedure Call Back phone  # [phone]  Permission to leave phone message Yes Yes Yes  Some recent data might be hidden     Patient questions:  Do you have a fever, pain , or abdominal swelling? No. Pain Score  0 *  Have you tolerated food without any problems? Yes.    Have you been able to return to your normal activities? Yes.    Do you have any questions about your discharge instructions: Diet   No. Medications  No. Follow up visit  No.  Do you have questions or concerns about your Care? No.  Actions: * If pain score is 4 or above: No action needed, pain <4.

## 2015-11-30 NOTE — Addendum Note (Signed)
Addended by: Leland Johns A on: 11/30/2015 04:50 PM   Modules accepted: Orders

## 2015-12-02 NOTE — Progress Notes (Signed)
Benign hyperplastic gastric polyp not a problem and it was removed Recall EGD 1 year  She is always anxious about the symptoms so please call from office and let her know. Follow-up in office as needed  [place] no letter

## 2015-12-16 ENCOUNTER — Telehealth: Payer: Self-pay | Admitting: Family Medicine

## 2015-12-16 NOTE — Telephone Encounter (Signed)
Her cholesterol is not bad but if she wants to increase to Crestor 10 mg tab, 1 tab po daily disp #30 with 5 rf or #90 with 1 rf

## 2015-12-16 NOTE — Telephone Encounter (Signed)
Relation to [id number] Call back [place]: [place], [place] [phone] (Phone) [phone] (Fax)     Reason for call:  Patient requesting a 90 day supply of rosuvastatin (CRESTOR) 10 MG tablet, patient states 5 MG is not working, please advise

## 2015-12-17 MED ORDER — ROSUVASTATIN CALCIUM 10 MG PO TABS: 10.0000 mg | ORAL_TABLET | Freq: Every day | ORAL | 1 refills | Status: DC

## 2015-12-23 ENCOUNTER — Other Ambulatory Visit: Payer: Self-pay | Admitting: Physician Assistant

## 2015-12-24 ENCOUNTER — Other Ambulatory Visit: Payer: Self-pay | Admitting: Family Medicine

## 2015-12-24 DIAGNOSIS — Z1231 Encounter for screening mammogram for malignant neoplasm of breast: Secondary | ICD-10-CM

## 2016-01-11 ENCOUNTER — Ambulatory Visit (INDEPENDENT_AMBULATORY_CARE_PROVIDER_SITE_OTHER): Payer: Medicare Other | Admitting: Family Medicine

## 2016-01-11 ENCOUNTER — Encounter: Payer: Self-pay | Admitting: Family Medicine

## 2016-01-11 VITALS — BP 120/78 | HR 65 | Temp 98.2°F | Ht 61.0 in | Wt 176.0 lb

## 2016-01-11 DIAGNOSIS — E1169 Type 2 diabetes mellitus with other specified complication: Secondary | ICD-10-CM

## 2016-01-11 DIAGNOSIS — E559 Vitamin D deficiency, unspecified: Secondary | ICD-10-CM | POA: Diagnosis not present

## 2016-01-11 DIAGNOSIS — I10 Essential (primary) hypertension: Secondary | ICD-10-CM | POA: Diagnosis not present

## 2016-01-11 DIAGNOSIS — M5441 Lumbago with sciatica, right side: Secondary | ICD-10-CM

## 2016-01-11 DIAGNOSIS — K219 Gastro-esophageal reflux disease without esophagitis: Secondary | ICD-10-CM | POA: Diagnosis not present

## 2016-01-11 DIAGNOSIS — N390 Urinary tract infection, site not specified: Secondary | ICD-10-CM | POA: Diagnosis not present

## 2016-01-11 DIAGNOSIS — K582 Mixed irritable bowel syndrome: Secondary | ICD-10-CM

## 2016-01-11 DIAGNOSIS — I471 Supraventricular tachycardia, unspecified: Secondary | ICD-10-CM

## 2016-01-11 DIAGNOSIS — E7800 Pure hypercholesterolemia, unspecified: Secondary | ICD-10-CM

## 2016-01-11 DIAGNOSIS — Z0000 Encounter for general adult medical examination without abnormal findings: Secondary | ICD-10-CM | POA: Diagnosis not present

## 2016-01-11 DIAGNOSIS — E669 Obesity, unspecified: Secondary | ICD-10-CM

## 2016-01-11 HISTORY — DX: Urinary tract infection, site not specified: N39.0

## 2016-01-11 MED ORDER — METFORMIN HCL ER 500 MG PO TB24: 500.0000 mg | ORAL_TABLET | Freq: Two times a day (BID) | ORAL | 1 refills | Status: DC

## 2016-01-11 NOTE — Assessment & Plan Note (Signed)
Taking Vitamin D supplements

## 2016-01-11 NOTE — Assessment & Plan Note (Signed)
Recheck urinalysis due to some abdominal and increased low back pain

## 2016-01-11 NOTE — Assessment & Plan Note (Signed)
RRR today, was having palpitations prior to increase of Metoprolol to 75 mg in am and still takes 50 mg in pm

## 2016-01-11 NOTE — Progress Notes (Signed)
Subjective:   [name] is a 75 y.o. female who presents for an Initial Medicare Annual Wellness Visit.  Review of Systems    No ROS.  Medicare Wellness Visit.   Cardiac Risk Factors include: advanced age (>57men, >60 women);diabetes mellitus;sedentary lifestyle;hypertension;obesity (BMI >30kg/m2) Sleep patterns: Sleeps about 7 hrs per night. Wakes 1 or 2 times per night to urinate. Usually feels rested when she wakes.  Home Safety/Smoke Alarms: Pt has security system and smoke detectors in home.   Living environment; residence and Firearm Safety: Pt lives alone in home. No firearms. States she feels safe. Seat Belt Safety/Bike Helmet: Wears seatbelt.   Counseling:   Eye Exam- Not on file. Spoke with Dr.Digby's office--pt has scheduled for 02-26-16. Dental- Follows with Dr.Jamal Odeh only as needed.  Female:   Pap-  Pt states last pap was a few yrs ago with Dr.Sylva Brook and she was told she no longer needs Paps.  Mammo-  Last 01/28/15. BI-RADS CATEGORY  1: Negative. Active order for MM screening. Pt states she has mammogram scheduled at the end of this month. Dexa scan- Last on 04/11/13: normal. Routine 2 yr screening recommended per report. Pt states she would like to discuss with Dr.Blyth on next f/u prior to scheduling. CCS- Last 08/30/2000 : Diverticulosis.      Objec.tive:    Today's Vitals   01/11/16 1310  BP: 120/78  Pulse: 65  Temp: 98.2 F (36.8 C)  TempSrc: Oral  SpO2: 97%  Weight: 176 lb (79.8 kg)  Height: 5\' 1"  (1.549 m)   Body mass index is 33.25 kg/m.   Current Medications (verified) Outpatient Encounter Prescriptions as of 01/11/2016  Medication Sig  . acetaminophen (TYLENOL) 500 MG tablet Take 2 tablets (1,000 mg total) by mouth every 6 (six) hours as needed for moderate pain.  Marland Kitchen ALPRAZolam (XANAX) 0.25 MG tablet Take 1 tablet (0.25 mg total) by mouth 2 (two) times daily as needed for anxiety.  Marland Kitchen amLODipine (NORVASC) 5 MG tablet Take 1 tablet  by mouth  daily  . aspirin 81 MG tablet Take 81 mg by mouth daily.  . calcium-vitamin D (OSCAL WITH D) 500-200 MG-UNIT per tablet Take 1 tablet by mouth.  . cholecalciferol (VITAMIN D) 1000 UNITS tablet Take 1,000 Units by mouth daily.    Marland Kitchen dicyclomine (BENTYL) 10 MG capsule Take 1 capsule (10 mg total) by mouth 4 (four) times daily as needed.  Marland Kitchen esomeprazole (NEXIUM) 40 MG capsule Take 1 capsule (40 mg total) by mouth daily before breakfast.  . FOLBIC 2.5-25-2 MG TABS tablet TAKE 1 TABLET BY MOUTH EVERY DAY  . glucose blood test strip Take once daily and as needed.  DX E11.9  . Lancets MISC Use as instructed once daily to check blood sugar. DX E11.9 (Patient taking differently: Use as instructed twice daily to check blood sugar. DX E11.9)  . metFORMIN (GLUCOPHAGE-XR) 500 MG 24 hr tablet Take 1 tablet (500 mg total) by mouth 2 (two) times daily.  . metoCLOPramide (REGLAN) 5 MG tablet Take 1 tablet by mouth two  times daily  . metoprolol (LOPRESSOR) 50 MG tablet Take 75 mg in the morning and 50 mg in the evening  . Multiple Vitamin (MULTIVITAMIN) tablet Take 1 tablet by mouth daily.    . ondansetron (ZOFRAN) 4 MG tablet Take 4 mg by mouth every 4 (four) hours as needed. For nausea    . potassium chloride (K-DUR,KLOR-CON) 10 MEQ tablet TAKE 1 TABLET BY MOUTH  DAILY  .  rosuvastatin (CRESTOR) 10 MG tablet Take 1 tablet (10 mg total) by mouth daily.  . valsartan (DIOVAN) 320 MG tablet Take 1 tablet by mouth  daily  . vitamin E 400 UNIT capsule Take 400 Units by mouth daily.  Marland Kitchen zoster vaccine live, PF, (ZOSTAVAX) 60454 UNT/0.65ML injection Inject 19,400 Units into the skin once.  . [DISCONTINUED] metFORMIN (GLUCOPHAGE-XR) 500 MG 24 hr tablet Take 1 tablet by mouth 3  times daily with meals   Facility-Administered Encounter Medications as of 01/11/2016  Medication  . 0.9 %  sodium chloride infusion    Allergies (verified) Tramadol   History: Past Medical History:  Diagnosis Date  . Anemia  06/08/2014  . Anxiety   . Arthritis    Spinal Osteoarthritis  . Cancer ([place])   . Carcinoid tumor of stomach   . Cataract   . Chest pain    Myoview 12/15 no ischemia.  . Chronic kidney disease    Left kidney smaller than right kidney  . Diabetes mellitus type 2 in obese ([place]) 09/05/2006   Qualifier: Diagnosis of  By: Marca Ancona RMA, Lucy    . Diabetic peripheral neuropathy ([place]) 10/29/2013  . Diverticulosis 08/30/2000   Colonoscopy   . Encounter for preventative adult health care exam with abnormal findings 09/14/2013  . Esophageal reflux   . Gastric polyp    Fundic Gland  . Gastroparesis   . Headache(784.0)   . Heart murmur    Echocardiogram 2/11: EF 60-65%, mild LAE, grade 1 diastolic dysfunction, aortic valve sclerosis, mean gradient 9 mm of mercury, PASP 34  . Iron deficiency anemia, unspecified   . 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 Leg swelling    bilateral  . Neck pain 04/22/2015  . PONV (postoperative nausea and vomiting)    pt states only needs small amount of anesthesia  . PSVT (paroxysmal supraventricular tachycardia) ([place])   . Pure hypercholesterolemia   . 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 Stroke ([place])    tia, 2014  . 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 Type II or unspecified type diabetes mellitus without mention of complication, not stated as uncontrolled   . Unspecified essential hypertension   . Unspecified hereditary and idiopathic peripheral neuropathy 10/29/2013   Past Surgical History:  Procedure Laterality Date  . CHOLECYSTECTOMY  1993  . COLONOSCOPY  11/11/2010   diverticulosis  . DILATATION & CURRETTAGE/HYSTEROSCOPY WITH RESECTOCOPE N/A 02/25/2013   Procedure: Attempted hysteroscopy with uterine perforation;  Surgeon: Jamey Reas de Berton Lan, MD;  Location: [facility];  Service: Gynecology;  Laterality: N/A;  . ESOPHAGOGASTRODUODENOSCOPY  08/29/2010; 09/15/2010   Carcinoid tumor less than 1 cm in July 2012 not seen in August 2012 , gastritis, fundic gland polyps  .  ESOPHAGOGASTRODUODENOSCOPY  05/16/2011  . ESOPHAGOGASTRODUODENOSCOPY  06/14/2012  . EUS  12/15/2010   Procedure: UPPER ENDOSCOPIC ULTRASOUND (EUS) LINEAR;  Surgeon: Owens Loffler, MD;  Location: [facility];  Service: Endoscopy;  Laterality: N/A;  . EYE SURGERY Bilateral    Bi lateral cateracts and bi lateral laser  . LAPAROSCOPY N/A 02/25/2013   Procedure: Cystoscopy and laparoscopy with fulguration of uterine serosa;  Surgeon: Jamey Reas de Berton Lan, MD;  Location: [facility];  Service: Gynecology;  Laterality: N/A;  . TONSILLECTOMY     Family History  Problem Relation Age of Onset  . Diabetes Mother   . Stroke Father     deceased age 38  . Heart disease Sister     deceased MI age 85  .  Heart disease Brother     deceased MI age 64  . Diabetes Maternal Grandmother   . Hypertension Paternal Grandmother   . Diabetes Sister   . Heart disease Sister   . Hypertension Sister   . Hyperlipidemia Sister   . Diabetes Sister   . Heart disease Sister   . Hypertension Sister   . Hyperlipidemia Sister   . Diabetes Brother   . Heart disease Brother   . Hypertension Brother   . Hyperlipidemia Brother   . Colon cancer Neg Hx   . Esophageal cancer Neg Hx   . Stomach cancer Neg Hx   . Rectal cancer Neg Hx    Social History   Occupational History  . Retail    Social History Main Topics  . Smoking status: Never Smoker  . Smokeless tobacco: Never Used     Comment: Never used tobacco  . Alcohol use No  . Drug use: No  . Sexual activity: No     Comment: lives alone, no dietary restrictions except avoid fresh veg, fruit, whole grains    Tobacco Counseling Counseling given: Yes   Activities of Daily Living In your present state of health, do you have any difficulty performing the following activities: 01/11/2016 04/05/2015  Hearing? N N  Vision? N N  Difficulty concentrating or making decisions? N N  Walking or climbing stairs? Y Y  Dressing or bathing? N N  Doing errands,  shopping? N N  Preparing Food and eating ? N -  Using the Toilet? N -  In the past six months, have you accidently leaked urine? N -  Do you have problems with loss of bowel control? N -  Managing your Medications? N -  Managing your Finances? N -  Housekeeping or managing your Housekeeping? N -  Some recent data might be hidden    Immunizations and Health Maintenance Immunization History  Administered Date(s) Administered  . Influenza Split 10/31/2010, 11/28/2011  . Influenza Whole 11/19/2007, 11/04/2008, 11/10/2009  . Influenza, High Dose Seasonal PF 11/11/2015  . Influenza,inj,Quad PF,36+ Mos 01/09/2013, 10/20/2013, 11/13/2014  . Pneumococcal Conjugate-13 09/11/2013  . Pneumococcal Polysaccharide-23 03/13/2007  . Td 03/02/2005   Health Maintenance Due  Topic Date Due  . ZOSTAVAX  03/31/2001  . OPHTHALMOLOGY EXAM  12/04/2014  . TETANUS/TDAP  03/03/2015  . FOOT EXAM  11/13/2015    Patient Care Team: [name], MD as PCP - General (Family Medicine) Gatha Mayer, MD as Consulting Physician (Gastroenterology) Lennon Alstrom, MD (Neurology) Minus Breeding, MD as Attending Physician (Cardiology)  Indicate any recent Medical Services you may have received from other than [facility] providers in the past year (date may be approximate).     Assessment:   This is a routine wellness examination for [name]. Physical assessment deferred to PCP.   Hearing/Vision screen  Hearing Screening   125Hz  250Hz  500Hz  1000Hz  2000Hz  3000Hz  4000Hz  6000Hz  8000Hz   Right ear:   Pass Pass Pass  Pass    Left ear:   Pass Pass Pass  Pass    Comments: Able to hear conversational tones w/o difficulty. No issues reported.     Visual Acuity Screening   Right eye Left eye Both eyes  Without correction: 20/20 20/20 20/20   With correction:       Dietary issues and exercise activities discussed:Diet (meal preparation, eat out, water intake, caffeinated beverages, dairy products, fruits and  vegetables):  24 HOUR RECALL Breakfast: Yogurt, egg white, toast coffee. Lunch: Left overs  Dinner: Meat and vegetables and bread. Pt states she drinks approx 7 glasses of water per day.  Current Exercise Habits: Home exercise routine, Type of exercise: stretching, Time (Minutes): 10, Frequency (Times/Week): 2, Weekly Exercise (Minutes/Week): 20, Intensity: Mild  Goals    . increase physical activity as tolerated      Depression Screen PHQ 2/9 Scores 01/11/2016 04/05/2015 02/18/2015 02/02/2014  PHQ - 2 Score 0 0 0 0  Exception Documentation - Patient refusal - -    Fall Risk Fall Risk  01/11/2016 11/08/2015 07/08/2015 04/05/2015 02/18/2015  Falls in the past year? No No No No No    Cognitive Function: MMSE - Mini Mental State Exam 01/11/2016  Orientation to time 5  Orientation to Place 5  Registration 3  Attention/ Calculation 5  Recall 0  Language- name 2 objects 2  Language- repeat 1  Language- follow 3 step command 3  Language- read & follow direction 1  Write a sentence 1  Copy design 1  Total score 27        Screening Tests Health Maintenance  Topic Date Due  . ZOSTAVAX  03/31/2001  . OPHTHALMOLOGY EXAM  12/04/2014  . TETANUS/TDAP  03/03/2015  . FOOT EXAM  11/13/2015  . HEMOGLOBIN A1C  05/25/2016  . MAMMOGRAM  01/25/2017  . COLONOSCOPY  11/10/2020  . INFLUENZA VACCINE  Completed  . DEXA SCAN  Completed  . PNA vac Low Risk Adult  Completed      Plan:  Follow up with Dr.Blyth in 2 months as directed. Review Advance Directive info provided. Notify us with any questions. Bring a copy of your advance directives to your next office visit. Continue to eat heart healthy diet (full of fruits, vegetables, whole grains, lean protein, water--limit salt, fat, and sugar intake) and increase physical activity as tolerated.   During the course of the visit, [name] was educated and counseled about the following appropriate screening and preventive services:   Vaccines to  include Pneumoccal, Influenza, Hepatitis B, Td, Zostavax, HCV  Cardiovascular disease screening  Colorectal cancer screening  Bone density screening  Diabetes screening  Glaucoma screening  Mammography/PAP  Nutrition counseling   Patient Instructions (the written plan) were given to the patient.    Shela Nevin, [place]   01/11/2016

## 2016-01-11 NOTE — Assessment & Plan Note (Signed)
hgba1c acceptable, minimize simple carbs. Increase exercise as tolerated. Continue current meds, tolerating Esomeprazole

## 2016-01-11 NOTE — Assessment & Plan Note (Signed)
Tolerating statin, encouraged heart healthy diet, avoid trans fats, minimize simple carbs and saturated fats. Increase exercise as tolerated 

## 2016-01-11 NOTE — Assessment & Plan Note (Signed)
Ongoing but worsening low back pain

## 2016-01-11 NOTE — Assessment & Plan Note (Signed)
Last hgba1c 6.1 so we will drop her Metformin dose to bid to see if it helps her diarrhea. She has very labile blood sugars down to 60s fasting and up to 260s if she eats too much. Increase blood sugar checks to tid and prn

## 2016-01-11 NOTE — Patient Instructions (Addendum)
Imodium as needed for diarrhea  Bland Diet Introduction A bland diet consists of foods that do not have a lot of fat or fiber. Foods without fat or fiber are easier for the body to digest. They are also less likely to irritate your mouth, throat, stomach, and other parts of your gastrointestinal tract. A bland diet is sometimes called a BRAT diet. What is my plan? Your health care provider or dietitian may recommend specific changes to your diet to prevent and treat your symptoms, such as:  Eating small meals often.  Cooking food until it is soft enough to chew easily.  Chewing your food well.  Drinking fluids slowly.  Not eating foods that are very spicy, sour, or fatty.  Not eating citrus fruits, such as oranges and grapefruit. What do I need to know about this diet?  Eat a variety of foods from the bland diet food list.  Do not follow a bland diet longer than you have to.  Ask your health care provider whether you should take vitamins. What foods can I eat? Grains  Hot cereals, such as cream of wheat. Bread, crackers, or tortillas made from refined white flour. Rice. Vegetables  Canned or cooked vegetables. Mashed or boiled potatoes. Fruits  Bananas. Applesauce. Other types of cooked or canned fruit with the skin and seeds removed, such as canned peaches or pears. Meats and Other Protein Sources  Scrambled eggs. Creamy peanut butter or other nut butters. Lean, well-cooked meats, such as chicken or fish. Tofu. Soups or broths. Dairy  Low-fat dairy products, such as milk, cottage cheese, or yogurt. Beverages  Water. Herbal tea. Apple juice. Sweets and Desserts  Pudding. Custard. Fruit gelatin. Ice cream. Fats and Oils  Mild salad dressings. Canola or olive oil. The items listed above may not be a complete list of allowed foods or beverages. Contact your dietitian for more options.  What foods are not recommended? Foods and ingredients that are often not recommended  include:  Spicy foods, such as hot sauce or salsa.  Fried foods.  Sour foods, such as pickled or fermented foods.  Raw vegetables or fruits, especially citrus or berries.  Caffeinated drinks.  Alcohol.  Strongly flavored seasonings or condiments. The items listed above may not be a complete list of foods and beverages that are not allowed. Contact your dietitian for more information.  This information is not intended to replace advice given to you by your health care provider. Make sure you discuss any questions you have with your health care provider. Document Released: 05/17/2015 Document Revised: 07/01/2015 Document Reviewed: 02/04/2014  2017 Elsevier Food Choices to Help Relieve Diarrhea, Adult When you have diarrhea, the foods you eat and your eating habits are very important. Choosing the right foods and drinks can help relieve diarrhea. Also, because diarrhea can last up to 7 days, you need to replace lost fluids and electrolytes (such as sodium, potassium, and chloride) in order to help prevent dehydration. What general guidelines do I need to follow?  Slowly drink 1 cup (8 oz) of fluid for each episode of diarrhea. If you are getting enough fluid, your urine will be clear or pale yellow.  Eat starchy foods. Some good choices include white rice, white toast, pasta, low-fiber cereal, baked potatoes (without the skin), saltine crackers, and bagels.  Avoid large servings of any cooked vegetables.  Limit fruit to two servings per day. A serving is  cup or 1 small piece.  Choose foods with less than 2  g of fiber per serving.  Limit fats to less than 8 tsp (38 g) per day.  Avoid fried foods.  Eat foods that have probiotics in them. Probiotics can be found in certain dairy products.  Avoid foods and beverages that may increase the speed at which food moves through the stomach and intestines (gastrointestinal tract). Things to avoid include:  High-fiber foods, such as dried  fruit, raw fruits and vegetables, nuts, seeds, and whole grain foods.  Spicy foods and high-fat foods.  Foods and beverages sweetened with high-fructose corn syrup, honey, or sugar alcohols such as xylitol, sorbitol, and mannitol. What foods are recommended? Grains  White rice. White, [place], or pita breads (fresh or toasted), including plain rolls, buns, or bagels. White pasta. Saltine, soda, or graham crackers. Pretzels. Low-fiber cereal. Cooked cereals made with water (such as cornmeal, farina, or cream cereals). Plain muffins. Matzo. Melba toast. Zwieback. Vegetables  Potatoes (without the skin). Strained tomato and vegetable juices. Most well-cooked and canned vegetables without seeds. Tender lettuce. Fruits  Cooked or canned applesauce, apricots, cherries, fruit cocktail, grapefruit, peaches, pears, or plums. Fresh bananas, apples without skin, cherries, grapes, cantaloupe, grapefruit, peaches, oranges, or plums. Meat and Other Protein Products  Baked or boiled chicken. Eggs. Tofu. Fish. Seafood. Smooth peanut butter. Ground or well-cooked tender beef, ham, veal, lamb, pork, or poultry. Dairy  Plain yogurt, kefir, and unsweetened liquid yogurt. Lactose-free milk, buttermilk, or soy milk. Plain hard cheese. Beverages  Sport drinks. Clear broths. Diluted fruit juices (except prune). Regular, caffeine-free sodas such as ginger ale. Water. Decaffeinated teas. Oral rehydration solutions. Sugar-free beverages not sweetened with sugar alcohols. Other  Bouillon, broth, or soups made from recommended foods. The items listed above may not be a complete list of recommended foods or beverages. Contact your dietitian for more options.  What foods are not recommended? Grains  Whole grain, whole wheat, bran, or rye breads, rolls, pastas, crackers, and cereals. Wild or brown rice. Cereals that contain more than 2 g of fiber per serving. Corn tortillas or taco shells. Cooked or dry oatmeal. Granola.  Popcorn. Vegetables  Raw vegetables. Cabbage, broccoli, Brussels sprouts, artichokes, baked beans, beet greens, corn, kale, legumes, peas, sweet potatoes, and yams. Potato skins. Cooked spinach and cabbage. Fruits  Dried fruit, including raisins and dates. Raw fruits. Stewed or dried prunes. Fresh apples with skin, apricots, mangoes, pears, raspberries, and strawberries. Meat and Other Protein Products  Chunky peanut butter. Nuts and seeds. Beans and lentils. Berniece Salines. Dairy  High-fat cheeses. Milk, chocolate milk, and beverages made with milk, such as milk shakes. Cream. Ice cream. Sweets and Desserts  Sweet rolls, doughnuts, and sweet breads. Pancakes and waffles. Fats and Oils  Butter. Cream sauces. Margarine. Salad oils. Plain salad dressings. Olives. Avocados. Beverages  Caffeinated beverages (such as coffee, tea, soda, or energy drinks). Alcoholic beverages. Fruit juices with pulp. Prune juice. Soft drinks sweetened with high-fructose corn syrup or sugar alcohols. Other  Coconut. Hot sauce. Chili powder. Mayonnaise. Gravy. Cream-based or milk-based soups. The items listed above may not be a complete list of foods and beverages to avoid. Contact your dietitian for more information.  What should I do if I become dehydrated? Diarrhea can sometimes lead to dehydration. Signs of dehydration include dark urine and dry mouth and skin. If you think you are dehydrated, you should rehydrate with an oral rehydration solution. These solutions can be purchased at pharmacies, retail stores, or online. Drink -1 cup (120-240 mL) of oral rehydration solution each time  you have an episode of diarrhea. If drinking this amount makes your diarrhea worse, try drinking smaller amounts more often. For example, drink 1-3 tsp (5-15 mL) every 5-10 minutes. A general rule for staying hydrated is to drink 1-2 L of fluid per day. Talk to your health care provider about the specific amount you should be drinking each  day. Drink enough fluids to keep your urine clear or pale yellow. This information is not intended to replace advice given to you by your health care provider. Make sure you discuss any questions you have with your health care provider. Document Released: 04/15/2003 Document Revised: 07/01/2015 Document Reviewed: 12/16/2012 Elsevier Interactive Patient Education  2017 [employer].

## 2016-01-11 NOTE — Progress Notes (Signed)
Pre visit review using our clinic review tool, if applicable. No additional management support is needed unless otherwise documented below in the visit note. 

## 2016-01-11 NOTE — Progress Notes (Signed)
---[name], female   DOB: [birth year], 75 y.o.   MRN: [id number]   Subjective:    [name], female    DOB: 24-Mar-1940, 75 y.o.   MRN: [id number]  Chief Complaint  Patient presents with  . Follow-up    HPI Patient is in today for follow up. She continues to struggle with back and neck pain most days but denies any recent falls or traumas. No radicular symptoms. Continues to struggle with IBS symptoms with intermittent abdominal cramping and diarrhea. No bloody or tarry stool. Denies CP/palp/SOB/HA/congestion/fevers or GU c/o. Taking meds as prescribed  Past Medical History:  Diagnosis Date  . Anemia 06/08/2014  . Anxiety   . Arthritis    Spinal Osteoarthritis  . Cancer ([place])   . Carcinoid tumor of stomach   . Cataract   . Chest pain    Myoview 12/15 no ischemia.  . Chronic kidney disease    Left kidney smaller than right kidney  . Diabetes mellitus type 2 in obese ([place]) 09/05/2006   Qualifier: Diagnosis of  By: Marca Ancona RMA, Lucy    . Diabetic peripheral neuropathy ([place]) 10/29/2013  . Diverticulosis 08/30/2000   Colonoscopy   . Encounter for preventative adult health care exam with abnormal findings 09/14/2013  . Esophageal reflux   . Gastric polyp    Fundic Gland  . Gastroparesis   . Headache(784.0)   . Heart murmur    Echocardiogram 2/11: EF 60-65%, mild LAE, grade 1 diastolic dysfunction, aortic valve sclerosis, mean gradient 9 mm of mercury, PASP 34  . Iron deficiency anemia, unspecified   . 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 Leg swelling    bilateral  . Neck pain 04/22/2015  . PONV (postoperative nausea and vomiting)    pt states only needs small amount of anesthesia  . PSVT (paroxysmal supraventricular tachycardia) ([place])   . Pure hypercholesterolemia   . 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 Stroke ([place])    tia, 2014  . 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 Type II or unspecified type diabetes mellitus without mention of complication, not stated as  uncontrolled   . Unspecified essential hypertension   . Unspecified hereditary and idiopathic peripheral neuropathy 10/29/2013    Past Surgical History:  Procedure Laterality Date  . CHOLECYSTECTOMY  1993  . COLONOSCOPY  11/11/2010   diverticulosis  . DILATATION & CURRETTAGE/HYSTEROSCOPY WITH RESECTOCOPE N/A 02/25/2013   Procedure: Attempted hysteroscopy with uterine perforation;  Surgeon: Jamey Reas de Berton Lan, MD;  Location: [facility];  Service: Gynecology;  Laterality: N/A;  . ESOPHAGOGASTRODUODENOSCOPY  08/29/2010; 09/15/2010   Carcinoid tumor less than 1 cm in July 2012 not seen in August 2012 , gastritis, fundic gland polyps  . ESOPHAGOGASTRODUODENOSCOPY  05/16/2011  . ESOPHAGOGASTRODUODENOSCOPY  06/14/2012  . EUS  12/15/2010   Procedure: UPPER ENDOSCOPIC ULTRASOUND (EUS) LINEAR;  Surgeon: Owens Loffler, MD;  Location: [facility];  Service: Endoscopy;  Laterality: N/A;  . EYE SURGERY Bilateral    Bi lateral cateracts and bi lateral laser  . LAPAROSCOPY N/A 02/25/2013   Procedure: Cystoscopy and laparoscopy with fulguration of uterine serosa;  Surgeon: Jamey Reas de Berton Lan, MD;  Location: [facility];  Service: Gynecology;  Laterality: N/A;  . TONSILLECTOMY      Family History  Problem Relation Age of Onset  . Diabetes Mother   . Stroke Father     deceased age 13  . Heart disease Sister     deceased MI age  82  . Heart disease Brother     deceased MI age 57  . Diabetes Maternal Grandmother   . Hypertension Paternal Grandmother   . Diabetes Sister   . Heart disease Sister   . Hypertension Sister   . Hyperlipidemia Sister   . Diabetes Sister   . Heart disease Sister   . Hypertension Sister   . Hyperlipidemia Sister   . Diabetes Brother   . Heart disease Brother   . Hypertension Brother   . Hyperlipidemia Brother   . Colon cancer Neg Hx   . Esophageal cancer Neg Hx   . Stomach cancer Neg Hx   . Rectal cancer Neg Hx     Social History   Social  History  . Marital status: Married    Spouse name: N/A  . Number of children: 0  . Years of education: college   Occupational History  . Retail    Social History Main Topics  . Smoking status: Never Smoker  . Smokeless tobacco: Never Used     Comment: Never used tobacco  . Alcohol use No  . Drug use: No  . Sexual activity: No     Comment: lives alone, no dietary restrictions except avoid fresh veg, fruit, whole grains   Other Topics Concern  . Not on file   Social History Narrative   Patient was married ([name]) - widow   Patient does not have any children.   Patient is right-handed.   Patient has a BA degree.   One caffeine drink daily     Outpatient Medications Prior to Visit  Medication Sig Dispense Refill  . acetaminophen (TYLENOL) 500 MG tablet Take 2 tablets (1,000 mg total) by mouth every 6 (six) hours as needed for moderate pain. 30 tablet 0  . ALPRAZolam (XANAX) 0.25 MG tablet Take 1 tablet (0.25 mg total) by mouth 2 (two) times daily as needed for anxiety. 30 tablet 1  . amLODipine (NORVASC) 5 MG tablet Take 1 tablet by mouth  daily 90 tablet 3  . aspirin 81 MG tablet Take 81 mg by mouth daily.    . calcium-vitamin D (OSCAL WITH D) 500-200 MG-UNIT per tablet Take 1 tablet by mouth.    . cholecalciferol (VITAMIN D) 1000 UNITS tablet Take 1,000 Units by mouth daily.      Marland Kitchen dicyclomine (BENTYL) 10 MG capsule Take 1 capsule (10 mg total) by mouth 4 (four) times daily as needed. 120 capsule 6  . esomeprazole (NEXIUM) 40 MG capsule Take 1 capsule (40 mg total) by mouth daily before breakfast. 30 capsule 11  . FOLBIC 2.5-25-2 MG TABS tablet TAKE 1 TABLET BY MOUTH EVERY DAY 90 tablet 3  . glucose blood test strip Take once daily and as needed.  DX E11.9 100 each 3  . Lancets MISC Use as instructed once daily to check blood sugar. DX E11.9 (Patient taking differently: Use as instructed twice daily to check blood sugar. DX E11.9) 200 each 2  . metoCLOPramide (REGLAN) 5 MG  tablet Take 1 tablet by mouth two  times daily 180 tablet 1  . metoprolol (LOPRESSOR) 50 MG tablet Take 75 mg in the morning and 50 mg in the evening 225 tablet 3  . Multiple Vitamin (MULTIVITAMIN) tablet Take 1 tablet by mouth daily.      . ondansetron (ZOFRAN) 4 MG tablet Take 4 mg by mouth every 4 (four) hours as needed. For nausea      . potassium chloride (K-DUR,KLOR-CON) 10 MEQ  tablet TAKE 1 TABLET BY MOUTH  DAILY 90 tablet 1  . rosuvastatin (CRESTOR) 10 MG tablet Take 1 tablet (10 mg total) by mouth daily. 90 tablet 1  . valsartan (DIOVAN) 320 MG tablet Take 1 tablet by mouth  daily 90 tablet 3  . vitamin E 400 UNIT capsule Take 400 Units by mouth daily.    Marland Kitchen zoster vaccine live, PF, (ZOSTAVAX) 30076 UNT/0.65ML injection Inject 19,400 Units into the skin once. 1 each 0  . metFORMIN (GLUCOPHAGE-XR) 500 MG 24 hr tablet Take 1 tablet by mouth 3  times daily with meals 270 tablet 1   Facility-Administered Medications Prior to Visit  Medication Dose Route Frequency Provider Last Rate Last Dose  . 0.9 %  sodium chloride infusion  500 mL Intravenous Continuous Gatha Mayer, MD        Allergies  Allergen Reactions  . Tramadol     Dizziness     Review of Systems  Constitutional: Positive for malaise/fatigue. Negative for fever.  HENT: Negative for congestion.   Eyes: Negative for blurred vision.  Respiratory: Negative for shortness of breath.   Cardiovascular: Negative for chest pain, palpitations and leg swelling.  Gastrointestinal: Positive for abdominal pain. Negative for blood in stool and nausea.  Genitourinary: Positive for frequency. Negative for dysuria.  Musculoskeletal: Positive for back pain and joint pain. Negative for falls.  Skin: Negative for rash.  Neurological: Negative for dizziness, loss of consciousness and headaches.  Endo/Heme/Allergies: Negative for environmental allergies.  Psychiatric/Behavioral: Positive for depression. The patient is not nervous/anxious.         Objective:    Physical Exam  Constitutional: She is oriented to person, place, and time. She appears well-developed and well-nourished. No distress.  HENT:  Head: Normocephalic and atraumatic.  Eyes: Conjunctivae are normal.  Neck: Neck supple. No thyromegaly present.  Cardiovascular: Normal rate, regular rhythm and normal heart sounds.   No murmur heard. Pulmonary/Chest: Effort normal and breath sounds normal. No respiratory distress.  Abdominal: Soft. Bowel sounds are normal. She exhibits no distension and no mass. There is no tenderness.  Musculoskeletal: She exhibits edema.  1+ b/l feet  Lymphadenopathy:    She has no cervical adenopathy.  Neurological: She is alert and oriented to person, place, and time.  Skin: Skin is warm and dry.  Psychiatric: She has a normal mood and affect. Her behavior is normal.    BP 120/78 (BP Location: Right Arm, Patient Position: Sitting, Cuff Size: Normal)   Pulse 65   Temp 98.2 F (36.8 C) (Oral)   Ht 5' 1"  (1.549 m)   Wt 176 lb (79.8 kg)   LMP 02/07/1992   SpO2 97%   BMI 33.25 kg/m  Wt Readings from Last 3 Encounters:  01/11/16 176 lb (79.8 kg)  11/29/15 174 lb (78.9 kg)  11/23/15 174 lb 4 oz (79 kg)     Lab Results  Component Value Date   WBC 8.0 11/25/2015   HGB 13.0 11/25/2015   HCT 38.1 11/25/2015   PLT 224.0 11/25/2015   GLUCOSE 120 (H) 11/25/2015   CHOL 191 11/25/2015   TRIG 134.0 11/25/2015   HDL 63.20 11/25/2015   LDLDIRECT 123.7 04/13/2011   LDLCALC 101 (H) 11/25/2015   ALT 34 11/25/2015   AST 28 11/25/2015   NA 135 11/25/2015   K 4.2 11/25/2015   CL 100 11/25/2015   CREATININE 0.70 11/25/2015   BUN 23 11/25/2015   CO2 28 11/25/2015   TSH 1.90 11/25/2015  INR 0.88 01/16/2011   HGBA1C 6.1 11/25/2015   MICROALBUR 1.6 11/13/2014    Lab Results  Component Value Date   TSH 1.90 11/25/2015   Lab Results  Component Value Date   WBC 8.0 11/25/2015   HGB 13.0 11/25/2015   HCT 38.1 11/25/2015    MCV 84.8 11/25/2015   PLT 224.0 11/25/2015   Lab Results  Component Value Date   NA 135 11/25/2015   K 4.2 11/25/2015   CHLORIDE 101 07/08/2015   CO2 28 11/25/2015   GLUCOSE 120 (H) 11/25/2015   BUN 23 11/25/2015   CREATININE 0.70 11/25/2015   BILITOT 0.4 11/25/2015   ALKPHOS 62 11/25/2015   AST 28 11/25/2015   ALT 34 11/25/2015   PROT 7.5 11/25/2015   ALBUMIN 4.4 11/25/2015   CALCIUM 10.0 11/25/2015   ANIONGAP 8 07/08/2015   EGFR 75 (L) 07/08/2015   GFR 86.79 11/25/2015   Lab Results  Component Value Date   CHOL 191 11/25/2015   Lab Results  Component Value Date   HDL 63.20 11/25/2015   Lab Results  Component Value Date   LDLCALC 101 (H) 11/25/2015   Lab Results  Component Value Date   TRIG 134.0 11/25/2015   Lab Results  Component Value Date   CHOLHDL 3 11/25/2015   Lab Results  Component Value Date   HGBA1C 6.1 11/25/2015       Assessment & Plan:   Problem List Items Addressed This Visit    Diabetes mellitus type 2 in obese ([place])    Last hgba1c 6.1 so we will drop her Metformin dose to bid to see if it helps her diarrhea. She has very labile blood sugars down to 60s fasting and up to 260s if she eats too much. Increase blood sugar checks to tid and prn      Relevant Medications   metFORMIN (GLUCOPHAGE-XR) 500 MG 24 hr tablet   Other Relevant Orders   Hemoglobin A1c   HYPERCHOLESTEROLEMIA    Tolerating statin, encouraged heart healthy diet, avoid trans fats, minimize simple carbs and saturated fats. Increase exercise as tolerated      Relevant Orders   Lipid panel   Essential hypertension    Well controlled, no changes to meds. Encouraged heart healthy diet such as the DASH diet and exercise as tolerated.       Relevant Orders   CBC   Comprehensive metabolic panel   TSH   Paroxysmal supraventricular tachycardia (H[facility])    RRR today, was having palpitations prior to increase of Metoprolol to 75 mg in am and still takes 50 mg in pm      IBS  (irritable bowel syndrome)    Has been seen by GI, she notes red meat, hi fiber, fatty foods, acidic foods causes flare. Encouraged to take a daily probiotic and avoid offending foods. May use Imodium prn with diarrhea when she has to go out      Vitamin D deficiency    Taking Vitamin D supplements      Relevant Orders   Vitamin D (25 hydroxy)   Low back pain    Ongoing but worsening low back pain      GERD (gastroesophageal reflux disease)    hgba1c acceptable, minimize simple carbs. Increase exercise as tolerated. Continue current meds, tolerating Esomeprazole      Recurrent UTI    Recheck urinalysis due to some abdominal and increased low back pain      Relevant Orders   Urinalysis  Urine culture      I have changed Ms. Crookshanks's metFORMIN. I am also having her maintain her multivitamin, cholecalciferol, ondansetron, acetaminophen, aspirin, calcium-vitamin D, vitamin E, Lancets, zoster vaccine live (PF), esomeprazole, dicyclomine, glucose blood, metoCLOPramide, FOLBIC, ALPRAZolam, valsartan, amLODipine, metoprolol, rosuvastatin, and potassium chloride. We will continue to administer sodium chloride.  Meds ordered this encounter  Medications  . metFORMIN (GLUCOPHAGE-XR) 500 MG 24 hr tablet    Sig: Take 1 tablet (500 mg total) by mouth 2 (two) times daily.    Dispense:  270 tablet    Refill:  1     Penni Homans, MD

## 2016-01-11 NOTE — Assessment & Plan Note (Signed)
Has been seen by GI, she notes red meat, hi fiber, fatty foods, acidic foods causes flare. Encouraged to take a daily probiotic and avoid offending foods. May use Imodium prn with diarrhea when she has to go out

## 2016-01-11 NOTE — Assessment & Plan Note (Signed)
Well controlled, no changes to meds. Encouraged heart healthy diet such as the DASH diet and exercise as tolerated.  °

## 2016-01-12 LAB — URINALYSIS, ROUTINE W REFLEX MICROSCOPIC
BILIRUBIN URINE: NEGATIVE
KETONES UR: NEGATIVE
Leukocytes, UA: NEGATIVE
NITRITE: NEGATIVE
PH: 5.5 (ref 5.0–8.0)
Specific Gravity, Urine: 1.01 (ref 1.000–1.030)
Total Protein, Urine: NEGATIVE
URINE GLUCOSE: NEGATIVE
UROBILINOGEN UA: 0.2 (ref 0.0–1.0)
WBC UA: NONE SEEN (ref 0–?)

## 2016-01-12 LAB — URINE CULTURE: Organism ID, Bacteria: NO GROWTH

## 2016-01-13 ENCOUNTER — Other Ambulatory Visit: Payer: Self-pay | Admitting: Internal Medicine

## 2016-01-13 ENCOUNTER — Telehealth: Payer: Self-pay | Admitting: Internal Medicine

## 2016-01-13 MED ORDER — DICYCLOMINE HCL 10 MG PO CAPS: 10.0000 mg | ORAL_CAPSULE | Freq: Four times a day (QID) | ORAL | 6 refills | Status: DC | PRN

## 2016-01-13 NOTE — Telephone Encounter (Signed)
Agree - pain not responding, fever, severe bowel habit changes, vomiting call back or go to ED

## 2016-01-13 NOTE — Telephone Encounter (Signed)
Refill x 6 mos 

## 2016-01-13 NOTE — Telephone Encounter (Signed)
May I refill Sir, thank you. 

## 2016-01-13 NOTE — Telephone Encounter (Signed)
Patient notified

## 2016-01-13 NOTE — Telephone Encounter (Signed)
Patient reports several days.  She has not been taking her dicyclomine, but she reports that it does help.  She is asked to continue her dicyclomine and that she can take it qid.  She is also advised that she should start on a low fiber bland diet.  Dr. Carlean Purl please advise if she needs any additional orders.

## 2016-01-27 ENCOUNTER — Other Ambulatory Visit: Payer: Self-pay | Admitting: Family Medicine

## 2016-02-03 ENCOUNTER — Ambulatory Visit: Payer: Medicare Other

## 2016-02-11 ENCOUNTER — Ambulatory Visit: Payer: Medicare Other

## 2016-02-15 ENCOUNTER — Other Ambulatory Visit: Payer: Self-pay | Admitting: Family Medicine

## 2016-02-15 DIAGNOSIS — Z1231 Encounter for screening mammogram for malignant neoplasm of breast: Secondary | ICD-10-CM

## 2016-02-17 ENCOUNTER — Other Ambulatory Visit: Payer: Self-pay | Admitting: Family Medicine

## 2016-02-17 ENCOUNTER — Ambulatory Visit (HOSPITAL_BASED_OUTPATIENT_CLINIC_OR_DEPARTMENT_OTHER)
Admission: RE | Admit: 2016-02-17 | Discharge: 2016-02-17 | Disposition: A | Discharge status: Home / Self Care | Payer: Medicare Other | Source: Ambulatory Visit | Attending: Family Medicine | Admitting: Family Medicine

## 2016-02-17 DIAGNOSIS — Z1231 Encounter for screening mammogram for malignant neoplasm of breast: Secondary | ICD-10-CM | POA: Diagnosis not present

## 2016-02-22 DIAGNOSIS — E119 Type 2 diabetes mellitus without complications: Secondary | ICD-10-CM | POA: Diagnosis not present

## 2016-02-22 DIAGNOSIS — H04123 Dry eye syndrome of bilateral lacrimal glands: Secondary | ICD-10-CM | POA: Diagnosis not present

## 2016-02-22 DIAGNOSIS — H524 Presbyopia: Secondary | ICD-10-CM | POA: Diagnosis not present

## 2016-03-03 ENCOUNTER — Telehealth: Payer: Self-pay | Admitting: Family Medicine

## 2016-03-03 NOTE — Telephone Encounter (Signed)
Called the pharmacy [facility] at [phone] to phone refill for strips in. They stated the strips will be sent in on the next business day. Patient is aware

## 2016-03-03 NOTE — Telephone Encounter (Signed)
Called the patient on both numbers left message to call back

## 2016-03-03 NOTE — Telephone Encounter (Signed)
Caller name: Joann N  Relation to pt: Self  Call back number: [phone] [facility]: [facility] [phone]  Reason for call: Pt called stating that Pharmacy mentioned they sent threw fax a request of glucose blood test strip (pt mentioned that pharmacy has request by fax twice). Pt is requesting refill for the test strip ASAP since pt is completely out of strips. Any question to call pt. Please advise ASAP.

## 2016-03-09 ENCOUNTER — Other Ambulatory Visit: Payer: Medicare Other

## 2016-03-09 NOTE — Telephone Encounter (Signed)
Patient called requesting to speak with [name] regarding her test strips. Please advise  Phone:[phone]

## 2016-03-10 ENCOUNTER — Other Ambulatory Visit (INDEPENDENT_AMBULATORY_CARE_PROVIDER_SITE_OTHER): Payer: Medicare Other

## 2016-03-10 DIAGNOSIS — E7800 Pure hypercholesterolemia, unspecified: Secondary | ICD-10-CM

## 2016-03-10 DIAGNOSIS — E1169 Type 2 diabetes mellitus with other specified complication: Secondary | ICD-10-CM

## 2016-03-10 DIAGNOSIS — E669 Obesity, unspecified: Secondary | ICD-10-CM

## 2016-03-10 DIAGNOSIS — E559 Vitamin D deficiency, unspecified: Secondary | ICD-10-CM | POA: Diagnosis not present

## 2016-03-10 DIAGNOSIS — I10 Essential (primary) hypertension: Secondary | ICD-10-CM | POA: Diagnosis not present

## 2016-03-10 LAB — COMPREHENSIVE METABOLIC PANEL
ALK PHOS: 54 U/L (ref 39–117)
ALT: 23 U/L (ref 0–35)
AST: 24 U/L (ref 0–37)
Albumin: 4.5 g/dL (ref 3.5–5.2)
BILIRUBIN TOTAL: 0.6 mg/dL (ref 0.2–1.2)
BUN: 20 mg/dL (ref 6–23)
CALCIUM: 10.1 mg/dL (ref 8.4–10.5)
CO2: 26 mEq/L (ref 19–32)
Chloride: 99 mEq/L (ref 96–112)
Creatinine, Ser: 0.69 mg/dL (ref 0.40–1.20)
GFR: 88.17 mL/min (ref >60.00)
Glucose, Bld: 141 mg/dL — ABNORMAL HIGH (ref 70–99)
POTASSIUM: 3.8 meq/L (ref 3.5–5.1)
Sodium: 135 mEq/L (ref 135–145)
TOTAL PROTEIN: 7.4 g/dL (ref 6.0–8.3)

## 2016-03-10 LAB — CBC
HEMATOCRIT: 38.9 % (ref 36.0–46.0)
HEMOGLOBIN: 13.4 g/dL (ref 12.0–15.0)
MCHC: 34.4 g/dL (ref 30.0–36.0)
MCV: 87.1 fl (ref 78.0–100.0)
PLATELETS: 210 10*3/uL (ref 150.0–400.0)
RBC: 4.46 Mil/uL (ref 3.87–5.11)
RDW: 12.5 % (ref 11.5–15.5)
WBC: 7.1 10*3/uL (ref 4.0–10.5)

## 2016-03-10 LAB — LIPID PANEL
Cholesterol: 203 mg/dL — ABNORMAL HIGH (ref 0–200)
HDL: 68.8 mg/dL (ref >39.00)
LDL Cholesterol: 114 mg/dL — ABNORMAL HIGH (ref 0–99)
NONHDL: 134.21
Total CHOL/HDL Ratio: 3
Triglycerides: 103 mg/dL (ref 0.0–149.0)
VLDL: 20.6 mg/dL (ref 0.0–40.0)

## 2016-03-10 LAB — TSH: TSH: 1.27 u[IU]/mL (ref 0.35–4.50)

## 2016-03-10 LAB — HEMOGLOBIN A1C: Hgb A1c MFr Bld: 6.2 % (ref 4.6–6.5)

## 2016-03-10 LAB — VITAMIN D 25 HYDROXY (VIT D DEFICIENCY, FRACTURES): VITD: 50.01 ng/mL (ref 30.00–100.00)

## 2016-03-10 NOTE — Telephone Encounter (Signed)
Called the patient and she stated [facility] sent her strips to test once a day and she actually test twice daily. I called [facility] to correct the amount she test, they corrected it and will call in within 24 hours and get more strips shipped to has.

## 2016-03-16 ENCOUNTER — Ambulatory Visit (INDEPENDENT_AMBULATORY_CARE_PROVIDER_SITE_OTHER): Payer: Medicare Other | Admitting: Family Medicine

## 2016-03-16 ENCOUNTER — Encounter: Payer: Self-pay | Admitting: Family Medicine

## 2016-03-16 DIAGNOSIS — I10 Essential (primary) hypertension: Secondary | ICD-10-CM | POA: Diagnosis not present

## 2016-03-16 DIAGNOSIS — R319 Hematuria, unspecified: Secondary | ICD-10-CM | POA: Diagnosis not present

## 2016-03-16 DIAGNOSIS — E559 Vitamin D deficiency, unspecified: Secondary | ICD-10-CM | POA: Diagnosis not present

## 2016-03-16 DIAGNOSIS — E1169 Type 2 diabetes mellitus with other specified complication: Secondary | ICD-10-CM

## 2016-03-16 DIAGNOSIS — E7800 Pure hypercholesterolemia, unspecified: Secondary | ICD-10-CM

## 2016-03-16 DIAGNOSIS — K582 Mixed irritable bowel syndrome: Secondary | ICD-10-CM

## 2016-03-16 DIAGNOSIS — E669 Obesity, unspecified: Secondary | ICD-10-CM

## 2016-03-16 HISTORY — DX: Hematuria, unspecified: R31.9

## 2016-03-16 MED ORDER — GLUCOSE BLOOD VI STRP: ORAL_STRIP | 3 refills | Status: DC

## 2016-03-16 MED ORDER — LANCETS MISC: 2 refills | Status: DC

## 2016-03-16 NOTE — Assessment & Plan Note (Signed)
Long standing and previous work up unremarkable. She agrees to try and find the records from her previous urologist

## 2016-03-16 NOTE — Patient Instructions (Addendum)
Over the counter CoQ-10 for cholesterol daily   Preventive Care 65 Years and Older, Female Preventive care refers to lifestyle choices and visits with your health care provider that can promote health and wellness. What does preventive care include?  A yearly physical exam. This is also called an annual well check.  Dental exams once or twice a year.  Routine eye exams. Ask your health care provider how often you should have your eyes checked.  Personal lifestyle choices, including:  Daily care of your teeth and gums.  Regular physical activity.  Eating a healthy diet.  Avoiding tobacco and drug use.  Limiting alcohol use.  Practicing safe sex.  Taking low-dose aspirin every day.  Taking vitamin and mineral supplements as recommended by your health care provider. What happens during an annual well check? The services and screenings done by your health care provider during your annual well check will depend on your age, overall health, lifestyle risk factors, and family history of disease. Counseling  Your health care provider may ask you questions about your:  Alcohol use.  Tobacco use.  Drug use.  Emotional well-being.  Home and relationship well-being.  Sexual activity.  Eating habits.  History of falls.  Memory and ability to understand (cognition).  Work and work Statistician.  Reproductive health. Screening  You may have the following tests or measurements:  Height, weight, and BMI.  Blood pressure.  Lipid and cholesterol levels. These may be checked every 5 years, or more frequently if you are over 28 years old.  Skin check.  Lung cancer screening. You may have this screening every year starting at age 33 if you have a 30-pack-year history of smoking and currently smoke or have quit within the past 15 years.  Fecal occult blood test (FOBT) of the stool. You may have this test every year starting at age 8.  Flexible sigmoidoscopy or  colonoscopy. You may have a sigmoidoscopy every 5 years or a colonoscopy every 10 years starting at age 44.  Hepatitis C blood test.  Hepatitis B blood test.  Sexually transmitted disease (STD) testing.  Diabetes screening. This is done by checking your blood sugar (glucose) after you have not eaten for a while (fasting). You may have this done every 1-3 years.  Bone density scan. This is done to screen for osteoporosis. You may have this done starting at age 15.  Mammogram. This may be done every 1-2 years. Talk to your health care provider about how often you should have regular mammograms. Talk with your health care provider about your test results, treatment options, and if necessary, the need for more tests. Vaccines  Your health care provider may recommend certain vaccines, such as:  Influenza vaccine. This is recommended every year.  Tetanus, diphtheria, and acellular pertussis (Tdap, Td) vaccine. You may need a Td booster every 10 years.  Varicella vaccine. You may need this if you have not been vaccinated.  Zoster vaccine. You may need this after age 17.  Measles, mumps, and rubella (MMR) vaccine. You may need at least one dose of MMR if you were born in 1957 or later. You may also need a second dose.  Pneumococcal 13-valent conjugate (PCV13) vaccine. One dose is recommended after age 56.  Pneumococcal polysaccharide (PPSV23) vaccine. One dose is recommended after age 79.  Meningococcal vaccine. You may need this if you have certain conditions.  Hepatitis A vaccine. You may need this if you have certain conditions or if you travel or  work in places where you may be exposed to hepatitis A.  Hepatitis B vaccine. You may need this if you have certain conditions or if you travel or work in places where you may be exposed to hepatitis B.  Haemophilus influenzae type b (Hib) vaccine. You may need this if you have certain conditions. Talk to your health care provider about  which screenings and vaccines you need and how often you need them. This information is not intended to replace advice given to you by your health care provider. Make sure you discuss any questions you have with your health care provider. Document Released: 02/19/2015 Document Revised: 10/13/2015 Document Reviewed: 11/24/2014 Elsevier Interactive Patient Education  2017 [employer].

## 2016-03-16 NOTE — Assessment & Plan Note (Signed)
hgba1c acceptable, minimize simple carbs. Increase exercise as tolerated. Continue current meds 

## 2016-03-16 NOTE — Assessment & Plan Note (Signed)
Encouraged heart healthy diet, increase   exercise, avoid trans fats, consider a krill oil cap daily. Will increase Crestor back up to 10 mg and add CoQ 10 daily

## 2016-03-16 NOTE — Assessment & Plan Note (Signed)
Continue daily supplement and recheck with next visit.

## 2016-03-16 NOTE — Assessment & Plan Note (Signed)
Well controlled, no changes to meds. Encouraged heart healthy diet such as the DASH diet and exercise as tolerated.  °

## 2016-03-16 NOTE — Progress Notes (Signed)
[name], female   DOB: 02/15/1940, 76 y.o.   MRN: [id number]   Subjective:    [name], female    DOB: 07/21/1940, 76 y.o.   MRN: [id number]  Chief Complaint  Patient presents with  . Follow-up  . Diabetes  . Hypertension  . Hyperlipidemia  I acted as a Education administrator for Dr. Charlett Blake. [name], RMA   Diabetes  She presents for her follow-up diabetic visit. She has type 2 diabetes mellitus. Her disease course has been stable. Pertinent negatives for hypoglycemia include no headaches. Pertinent negatives for diabetes include no blurred vision, no chest pain, no fatigue and no weight loss. Risk factors for coronary artery disease include hypertension. She is compliant with treatment all of the time. Her weight is stable.  Hypertension  This is a chronic problem. The current episode started more than 1 year ago. The problem is unchanged. Associated symptoms include malaise/fatigue. Pertinent negatives include no blurred vision, chest pain, headaches, palpitations or shortness of breath. Risk factors for coronary artery disease include diabetes mellitus.  Hyperlipidemia  This is a chronic problem. The problem is controlled. Recent lipid tests were reviewed and are variable. Associated symptoms include myalgias. Pertinent negatives include no chest pain or shortness of breath.    Patient is in today for 6 month follow up for hypertension, diabetes type 2, hyperlipidemia and other chronic medical concerns. She denies any acute febrile illness or recent hospitalizations. Notes some occasional diarrhea but no bloody or tarry stool. She does note a sense of burning at her mid abdomen. Denies CP/palp/SOB/HA/congestion/fevers/GI or GU c/o. Taking meds as prescribed  Past Medical History:  Diagnosis Date  . Anemia 06/08/2014  . Anxiety   . Arthritis    Spinal Osteoarthritis  . Cancer ([place])   . Carcinoid tumor of stomach   . Cataract   . Chest pain    Myoview  12/15 no ischemia.  . Chronic kidney disease    Left kidney smaller than right kidney  . Diabetes mellitus type 2 in obese ([place]) 09/05/2006   Qualifier: Diagnosis of  By: Marca Ancona RMA, Lucy    . Diabetic peripheral neuropathy ([place]) 10/29/2013  . Diverticulosis 08/30/2000   Colonoscopy   . Encounter for preventative adult health care exam with abnormal findings 09/14/2013  . Esophageal reflux   . Gastric polyp    Fundic Gland  . Gastroparesis   . Headache(784.0)   . Heart murmur    Echocardiogram 2/11: EF 60-65%, mild LAE, grade 1 diastolic dysfunction, aortic valve sclerosis, mean gradient 9 mm of mercury, PASP 34  . Hematuria 03/16/2016  . Iron deficiency anemia, unspecified   . 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 Leg swelling    bilateral  . Neck pain 04/22/2015  . PONV (postoperative nausea and vomiting)    pt states only needs small amount of anesthesia  . PSVT (paroxysmal supraventricular tachycardia) ([place])   . Pure hypercholesterolemia   . 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 Stroke ([place])    tia, 2014  . 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 Type II or unspecified type diabetes mellitus without mention of complication, not stated as uncontrolled   . Unspecified essential hypertension   . Unspecified hereditary and idiopathic peripheral neuropathy 10/29/2013    Past Surgical History:  Procedure Laterality Date  . CHOLECYSTECTOMY  1993  . COLONOSCOPY  11/11/2010   diverticulosis  . DILATATION & CURRETTAGE/HYSTEROSCOPY WITH RESECTOCOPE N/A 02/25/2013   Procedure: Attempted hysteroscopy with uterine perforation;  Surgeon:  Brook E Amundson de Berton Lan, MD;  Location: [facility];  Service: Gynecology;  Laterality: N/A;  . ESOPHAGOGASTRODUODENOSCOPY  08/29/2010; 09/15/2010   Carcinoid tumor less than 1 cm in July 2012 not seen in August 2012 , gastritis, fundic gland polyps  . ESOPHAGOGASTRODUODENOSCOPY  05/16/2011  . ESOPHAGOGASTRODUODENOSCOPY  06/14/2012  . EUS  12/15/2010   Procedure: UPPER ENDOSCOPIC ULTRASOUND  (EUS) LINEAR;  Surgeon: Owens Loffler, MD;  Location: [facility];  Service: Endoscopy;  Laterality: N/A;  . EYE SURGERY Bilateral    Bi lateral cateracts and bi lateral laser  . LAPAROSCOPY N/A 02/25/2013   Procedure: Cystoscopy and laparoscopy with fulguration of uterine serosa;  Surgeon: Jamey Reas de Berton Lan, MD;  Location: [facility];  Service: Gynecology;  Laterality: N/A;  . TONSILLECTOMY      Family History  Problem Relation Age of Onset  . Diabetes Mother   . Stroke Father     deceased age 54  . Heart disease Sister     deceased MI age 38  . Heart disease Brother     deceased MI age 58  . Diabetes Maternal Grandmother   . Hypertension Paternal Grandmother   . Diabetes Sister   . Heart disease Sister   . Hypertension Sister   . Hyperlipidemia Sister   . Diabetes Sister   . Heart disease Sister   . Hypertension Sister   . Hyperlipidemia Sister   . Diabetes Brother   . Heart disease Brother   . Hypertension Brother   . Hyperlipidemia Brother   . Colon cancer Neg Hx   . Esophageal cancer Neg Hx   . Stomach cancer Neg Hx   . Rectal cancer Neg Hx     Social History   Social History  . Marital status: Married    Spouse name: N/A  . Number of children: 0  . Years of education: college   Occupational History  . Retail    Social History Main Topics  . Smoking status: Never Smoker  . Smokeless tobacco: Never Used     Comment: Never used tobacco  . Alcohol use No  . Drug use: No  . Sexual activity: No     Comment: lives alone, no dietary restrictions except avoid fresh veg, fruit, whole grains   Other Topics Concern  . Not on file   Social History Narrative   Patient was married ([name]) - widow   Patient does not have any children.   Patient is right-handed.   Patient has a BA degree.   One caffeine drink daily     Outpatient Medications Prior to Visit  Medication Sig Dispense Refill  . acetaminophen (TYLENOL) 500 MG tablet Take 2 tablets  (1,000 mg total) by mouth every 6 (six) hours as needed for moderate pain. 30 tablet 0  . ALPRAZolam (XANAX) 0.25 MG tablet Take 1 tablet (0.25 mg total) by mouth 2 (two) times daily as needed for anxiety. 30 tablet 1  . amLODipine (NORVASC) 5 MG tablet Take 1 tablet by mouth  daily 90 tablet 3  . aspirin 81 MG tablet Take 81 mg by mouth daily.    . calcium-vitamin D (OSCAL WITH D) 500-200 MG-UNIT per tablet Take 1 tablet by mouth.    . cholecalciferol (VITAMIN D) 1000 UNITS tablet Take 1,000 Units by mouth daily.      Marland Kitchen dicyclomine (BENTYL) 10 MG capsule Take 1 capsule (10 mg total) by mouth 4 (four) times daily as needed. [place]  capsule 6  . esomeprazole (NEXIUM) 40 MG capsule Take 1 capsule (40 mg total) by mouth daily before breakfast. 30 capsule 11  . FOLBIC 2.5-25-2 MG TABS tablet TAKE 1 TABLET BY MOUTH EVERY DAY 90 tablet 3  . metFORMIN (GLUCOPHAGE-XR) 500 MG 24 hr tablet TAKE 1 TABLET BY MOUTH 3  TIMES DAILY WITH MEALS 270 tablet 1  . metoCLOPramide (REGLAN) 5 MG tablet TAKE 1 TABLET BY MOUTH TWO  TIMES DAILY 180 tablet 1  . metoprolol (LOPRESSOR) 50 MG tablet Take 75 mg in the morning and 50 mg in the evening 225 tablet 3  . Multiple Vitamin (MULTIVITAMIN) tablet Take 1 tablet by mouth daily.      . ondansetron (ZOFRAN) 4 MG tablet Take 4 mg by mouth every 4 (four) hours as needed. For nausea      . potassium chloride (K-DUR,KLOR-CON) 10 MEQ tablet TAKE 1 TABLET BY MOUTH  DAILY 90 tablet 1  . rosuvastatin (CRESTOR) 10 MG tablet Take 1 tablet (10 mg total) by mouth daily. 90 tablet 1  . valsartan (DIOVAN) 320 MG tablet Take 1 tablet by mouth  daily 90 tablet 3  . vitamin E 400 UNIT capsule Take 400 Units by mouth daily.    Marland Kitchen zoster vaccine live, PF, (ZOSTAVAX) 87564 UNT/0.65ML injection Inject 19,400 Units into the skin once. 1 each 0  . glucose blood test strip Take once daily and as needed.  DX E11.9 (Patient taking differently: Take twice daily and as needed.  DX E11.9) 100 each 3  .  Lancets MISC Use as instructed once daily to check blood sugar. DX E11.9 (Patient taking differently: Use as instructed twice daily to check blood sugar. DX E11.9) 200 each 2   Facility-Administered Medications Prior to Visit  Medication Dose Route Frequency Provider Last Rate Last Dose  . 0.9 %  sodium chloride infusion  500 mL Intravenous Continuous Gatha Mayer, MD        Allergies  Allergen Reactions  . Tramadol     Dizziness     Review of Systems  Constitutional: Positive for malaise/fatigue. Negative for fatigue, fever and weight loss.  HENT: Negative for congestion.   Eyes: Negative for blurred vision.  Respiratory: Negative for cough and shortness of breath.   Cardiovascular: Negative for chest pain, palpitations and leg swelling.  Gastrointestinal: Positive for abdominal pain and diarrhea. Negative for vomiting.  Genitourinary: Negative for frequency.  Musculoskeletal: Positive for myalgias. Negative for back pain.  Skin: Negative for rash.  Neurological: Negative for loss of consciousness and headaches.       Objective:    Physical Exam  Constitutional: She is oriented to person, place, and time. She appears well-developed and well-nourished. No distress.  HENT:  Head: Normocephalic and atraumatic.  Eyes: Conjunctivae are normal.  Neck: Normal range of motion. No thyromegaly present.  Cardiovascular: Normal rate and regular rhythm.   Pulmonary/Chest: Effort normal and breath sounds normal. She has no wheezes.  Abdominal: Soft. Bowel sounds are normal. There is no tenderness.  Musculoskeletal: Normal range of motion. She exhibits no edema or deformity.  Neurological: She is alert and oriented to person, place, and time.  Skin: Skin is warm and dry. She is not diaphoretic.  Psychiatric: She has a normal mood and affect.    BP 136/68 (BP Location: Left Arm, Patient Position: Sitting, Cuff Size: Normal)   Pulse 66   Temp 98.3 F (36.8 C) (Oral)   Wt 172 lb 6.4  oz (78.2  kg)   LMP 02/07/1992   SpO2 98%   BMI 32.57 kg/m  Wt Readings from Last 3 Encounters:  03/16/16 172 lb 6.4 oz (78.2 kg)  01/11/16 176 lb (79.8 kg)  11/29/15 174 lb (78.9 kg)     Lab Results  Component Value Date   WBC 7.1 03/10/2016   HGB 13.4 03/10/2016   HCT 38.9 03/10/2016   PLT 210.0 03/10/2016   GLUCOSE 141 (H) 03/10/2016   CHOL 203 (H) 03/10/2016   TRIG 103.0 03/10/2016   HDL 68.80 03/10/2016   LDLDIRECT 123.7 04/13/2011   LDLCALC 114 (H) 03/10/2016   ALT 23 03/10/2016   AST 24 03/10/2016   NA 135 03/10/2016   K 3.8 03/10/2016   CL 99 03/10/2016   CREATININE 0.69 03/10/2016   BUN 20 03/10/2016   CO2 26 03/10/2016   TSH 1.27 03/10/2016   INR 0.88 01/16/2011   HGBA1C 6.2 03/10/2016   MICROALBUR 1.6 11/13/2014    Lab Results  Component Value Date   TSH 1.27 03/10/2016   Lab Results  Component Value Date   WBC 7.1 03/10/2016   HGB 13.4 03/10/2016   HCT 38.9 03/10/2016   MCV 87.1 03/10/2016   PLT 210.0 03/10/2016   Lab Results  Component Value Date   NA 135 03/10/2016   K 3.8 03/10/2016   CHLORIDE 101 07/08/2015   CO2 26 03/10/2016   GLUCOSE 141 (H) 03/10/2016   BUN 20 03/10/2016   CREATININE 0.69 03/10/2016   BILITOT 0.6 03/10/2016   ALKPHOS 54 03/10/2016   AST 24 03/10/2016   ALT 23 03/10/2016   PROT 7.4 03/10/2016   ALBUMIN 4.5 03/10/2016   CALCIUM 10.1 03/10/2016   ANIONGAP 8 07/08/2015   EGFR 75 (L) 07/08/2015   GFR 88.17 03/10/2016   Lab Results  Component Value Date   CHOL 203 (H) 03/10/2016   Lab Results  Component Value Date   HDL 68.80 03/10/2016   Lab Results  Component Value Date   LDLCALC 114 (H) 03/10/2016   Lab Results  Component Value Date   TRIG 103.0 03/10/2016   Lab Results  Component Value Date   CHOLHDL 3 03/10/2016   Lab Results  Component Value Date   HGBA1C 6.2 03/10/2016       Assessment & Plan:   Problem List Items Addressed This Visit    Diabetes mellitus type 2 in obese ([place])     hgba1c acceptable, minimize simple carbs. Increase exercise as tolerated. Continue current meds      HYPERCHOLESTEROLEMIA    Encouraged heart healthy diet, increase   exercise, avoid trans fats, consider a krill oil cap daily. Will increase Crestor back up to 10 mg and add CoQ 10 daily      Relevant Orders   Lipid panel   Essential hypertension    Well controlled, no changes to meds. Encouraged heart healthy diet such as the DASH diet and exercise as tolerated.       Relevant Orders   CBC   Comprehensive metabolic panel   TSH   IBS (irritable bowel syndrome)    Well controlled most days, does still note some burning at umbilicus at times but no bloody or tarry stool encouraged to try to apply topical Vitamin E and Lidocaine gel prn      Vitamin D deficiency    Continue daily supplement and recheck with next visit.       Relevant Orders   VITAMIN D 25 Hydroxy (Vit-D Deficiency, Fractures)  Hematuria    Long standing and previous work up unremarkable. She agrees to try and find the records from her previous urologist          I have changed Ms. Bonczek's Lancets and glucose blood. I am also having her maintain her multivitamin, cholecalciferol, ondansetron, acetaminophen, aspirin, calcium-vitamin D, vitamin E, zoster vaccine live (PF), esomeprazole, FOLBIC, ALPRAZolam, valsartan, amLODipine, metoprolol, rosuvastatin, potassium chloride, metoCLOPramide, dicyclomine, and metFORMIN. We will continue to administer sodium chloride.  Meds ordered this encounter  Medications  . Lancets MISC    Sig: Use twice daily to check blood sugar. DX E11.9    Dispense:  200 each    Refill:  2  . glucose blood test strip    Sig: Check twice daily and as needed.  DX E11.9    Dispense:  100 each    Refill:  3    CMA served as scribe during this visit. History, Physical and Plan performed by medical provider. Documentation and orders reviewed and attested to.  Penni Homans, MD

## 2016-03-16 NOTE — Progress Notes (Signed)
Pre visit review using our clinic review tool, if applicable. No additional management support is needed unless otherwise documented below in the visit note. 

## 2016-03-16 NOTE — Assessment & Plan Note (Signed)
Well controlled most days, does still note some burning at umbilicus at times but no bloody or tarry stool encouraged to try to apply topical Vitamin E and Lidocaine gel prn

## 2016-03-21 NOTE — Telephone Encounter (Signed)
[place] [facility] called and a verbal order was put in for patient to test twice daily.  PC

## 2016-03-23 NOTE — Telephone Encounter (Signed)
Patient called stating that her test strips were sent to [facility] instead of [facility]. She states the prescription need to go to [facility]. Please advise  Phone: [phone]

## 2016-03-23 NOTE — Telephone Encounter (Signed)
Patient informed of all information regarding strips.

## 2016-03-23 NOTE — Telephone Encounter (Signed)
All future strips must be called into [facility].

## 2016-03-23 NOTE — Telephone Encounter (Signed)
I spoke to [facility] and they sent out the strips she should have received them on 03/09/2016. But the problem is that she then changed to testing twice daily and insurance just paid for once daily #90 days and they will not pay for twice daily.  She needs to contact [facility] herself around April 1 and request a refill and [facility] will then honor the refill for strips testing twice daily. Nothing else we can do at this time. Called the patient left a message to call back.

## 2016-04-20 ENCOUNTER — Other Ambulatory Visit: Payer: Self-pay | Admitting: Family Medicine

## 2016-04-24 ENCOUNTER — Other Ambulatory Visit: Payer: Self-pay | Admitting: Family Medicine

## 2016-04-24 ENCOUNTER — Telehealth: Payer: Self-pay | Admitting: Family Medicine

## 2016-04-24 DIAGNOSIS — M545 Low back pain: Secondary | ICD-10-CM

## 2016-04-24 DIAGNOSIS — M79606 Pain in leg, unspecified: Secondary | ICD-10-CM

## 2016-04-24 NOTE — Telephone Encounter (Signed)
Caller name: Relationship to patient: Self Can be reached: [phone]  Pharmacy:  Reason for call: Request referral for PT for her back and leg pain. States provider knows why she needs it

## 2016-04-25 ENCOUNTER — Ambulatory Visit (INDEPENDENT_AMBULATORY_CARE_PROVIDER_SITE_OTHER): Payer: Medicare Other | Admitting: Medical

## 2016-04-25 ENCOUNTER — Encounter: Payer: Self-pay | Admitting: Medical

## 2016-04-25 ENCOUNTER — Ambulatory Visit (HOSPITAL_BASED_OUTPATIENT_CLINIC_OR_DEPARTMENT_OTHER)
Admission: RE | Admit: 2016-04-25 | Discharge: 2016-04-25 | Disposition: A | Discharge status: Home / Self Care | Payer: Medicare Other | Source: Ambulatory Visit | Attending: Medical | Admitting: Medical

## 2016-04-25 VITALS — BP 143/66 | HR 64 | Temp 97.8°F | Resp 16 | Ht 61.0 in | Wt 173.4 lb

## 2016-04-25 DIAGNOSIS — M25551 Pain in right hip: Secondary | ICD-10-CM

## 2016-04-25 DIAGNOSIS — M5441 Lumbago with sciatica, right side: Secondary | ICD-10-CM | POA: Insufficient documentation

## 2016-04-25 DIAGNOSIS — M48061 Spinal stenosis, lumbar region without neurogenic claudication: Secondary | ICD-10-CM | POA: Insufficient documentation

## 2016-04-25 DIAGNOSIS — M47897 Other spondylosis, lumbosacral region: Secondary | ICD-10-CM | POA: Insufficient documentation

## 2016-04-25 DIAGNOSIS — M545 Low back pain: Secondary | ICD-10-CM | POA: Diagnosis not present

## 2016-04-25 DIAGNOSIS — I700 Atherosclerosis of aorta: Secondary | ICD-10-CM | POA: Diagnosis not present

## 2016-04-25 DIAGNOSIS — M47896 Other spondylosis, lumbar region: Secondary | ICD-10-CM | POA: Diagnosis not present

## 2016-04-25 MED ORDER — KETOROLAC TROMETHAMINE 60 MG/2ML IM SOLN
60.0000 mg | Freq: Once | INTRAMUSCULAR | Status: AC
  Administered 2016-04-25: 60 mg via INTRAMUSCULAR

## 2016-04-25 MED ORDER — TIZANIDINE HCL 4 MG PO CAPS: ORAL_CAPSULE | ORAL | 0 refills | Status: DC

## 2016-04-25 MED FILL — tiZANidine HCL 4 MG TABS: 4 | 7 days supply | Qty: 7 | Fill #0

## 2016-04-25 NOTE — Progress Notes (Signed)
Pre visit review using our clinic review tool, if applicable. No additional management support is needed unless otherwise documented below in the visit note/SLS  

## 2016-04-25 NOTE — Patient Instructions (Signed)
For your back pain, sciatica and rt hip pain we gave toradol 60 mg IM. Will avoid oral nsaids due to stomach history. Also avoid pain meds due to your concern on side effects. Will provide low dose xanaflex but only at night use(rx advisement).  Will get xray of lumbar spine and xray of rt hip.  Will refer to sports medicine and they will likely get you back in with PT  Follow up as regularly scheduled with pcp or with [place] as needed before or after sports med referral.

## 2016-04-25 NOTE — Progress Notes (Signed)
Subjective:    [name], female    DOB: 11/07/1940, 76 y.o.   MRN: [id number]  HPI  Pt in with some back pain. She has 2 weeks of some lower back pain with some pain radiating to rt buttox and down her legs. Pt has history of this pain in summer of 2017. She reports in past PT has helped.   Pt has history of multilevel spondylosis.   Pt dexascan in 2015 was normal. In 2007 pt states by mri she had bulging disc(l5-s1 level) and some l4-l5 narrowing of rt foramen. One specialist told her she might need surgery. She decided not to get.  Pt has been using topical topical asper cream and heating pad. Pt states pain will come down but even mid activity will increase pain.  Pt has good kidney function. Pt told she was advised to avoid or nsaids in the past.  No incontinence, no saddle anesthesia and no leg weakness.    Review of Systems  Constitutional: Negative for chills, fatigue and fever.  Respiratory: Negative for cough, chest tightness, shortness of breath and wheezing.   Cardiovascular: Negative for chest pain and palpitations.  Gastrointestinal: Negative for abdominal distention, abdominal pain, blood in stool, constipation, diarrhea, nausea and vomiting.  Genitourinary: Negative for dysuria, flank pain and frequency.  Musculoskeletal: Positive for back pain. Negative for joint swelling.       Hip pain.  Skin: Negative for rash.  Neurological: Negative for dizziness and headaches.       Some radiating features.  Hematological: Negative for adenopathy. Does not bruise/bleed easily.  Psychiatric/Behavioral: Negative for behavioral problems and confusion.   Past Medical History:  Diagnosis Date  . Anemia 06/08/2014  . Anxiety   . Arthritis    Spinal Osteoarthritis  . Cancer ([place])   . Carcinoid tumor of stomach   . Cataract   . Chest pain    Myoview 12/15 no ischemia.  . Chronic kidney disease    Left kidney smaller than right kidney  . Diabetes  mellitus type 2 in obese ([place]) 09/05/2006   Qualifier: Diagnosis of  By: Marca Ancona RMA, Lucy    . Diabetic peripheral neuropathy ([place]) 10/29/2013  . Diverticulosis 08/30/2000   Colonoscopy   . Encounter for preventative adult health care exam with abnormal findings 09/14/2013  . Esophageal reflux   . Gastric polyp    Fundic Gland  . Gastroparesis   . Headache(784.0)   . Heart murmur    Echocardiogram 2/11: EF 60-65%, mild LAE, grade 1 diastolic dysfunction, aortic valve sclerosis, mean gradient 9 mm of mercury, PASP 34  . Hematuria 03/16/2016  . Iron deficiency anemia, unspecified   . 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 Leg swelling    bilateral  . Neck pain 04/22/2015  . PONV (postoperative nausea and vomiting)    pt states only needs small amount of anesthesia  . PSVT (paroxysmal supraventricular tachycardia) ([place])   . Pure hypercholesterolemia   . 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 Stroke ([place])    tia, 2014  . 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 Type II or unspecified type diabetes mellitus without mention of complication, not stated as uncontrolled   . Unspecified essential hypertension   . Unspecified hereditary and idiopathic peripheral neuropathy 10/29/2013     Social History   Social History  . Marital status: Married    Spouse name: N/A  . Number of children: 0  . Years of education: college   Occupational History  .  Retail    Social History Main Topics  . Smoking status: Never Smoker  . Smokeless tobacco: Never Used     Comment: Never used tobacco  . Alcohol use No  . Drug use: No  . Sexual activity: No     Comment: lives alone, no dietary restrictions except avoid fresh veg, fruit, whole grains   Other Topics Concern  . Not on file   Social History Narrative   Patient was married ([name]) - widow   Patient does not have any children.   Patient is right-handed.   Patient has a BA degree.   One caffeine drink daily     Past Surgical History:  Procedure Laterality Date  .  CHOLECYSTECTOMY  1993  . COLONOSCOPY  11/11/2010   diverticulosis  . DILATATION & CURRETTAGE/HYSTEROSCOPY WITH RESECTOCOPE N/A 02/25/2013   Procedure: Attempted hysteroscopy with uterine perforation;  Surgeon: Jamey Reas de Berton Lan, MD;  Location: [facility];  Service: Gynecology;  Laterality: N/A;  . ESOPHAGOGASTRODUODENOSCOPY  08/29/2010; 09/15/2010   Carcinoid tumor less than 1 cm in July 2012 not seen in August 2012 , gastritis, fundic gland polyps  . ESOPHAGOGASTRODUODENOSCOPY  05/16/2011  . ESOPHAGOGASTRODUODENOSCOPY  06/14/2012  . EUS  12/15/2010   Procedure: UPPER ENDOSCOPIC ULTRASOUND (EUS) LINEAR;  Surgeon: Owens Loffler, MD;  Location: [facility];  Service: Endoscopy;  Laterality: N/A;  . EYE SURGERY Bilateral    Bi lateral cateracts and bi lateral laser  . LAPAROSCOPY N/A 02/25/2013   Procedure: Cystoscopy and laparoscopy with fulguration of uterine serosa;  Surgeon: Jamey Reas de Berton Lan, MD;  Location: [facility];  Service: Gynecology;  Laterality: N/A;  . TONSILLECTOMY      Family History  Problem Relation Age of Onset  . Diabetes Mother   . Stroke Father     deceased age 32  . Heart disease Sister     deceased MI age 60  . Heart disease Brother     deceased MI age 17  . Diabetes Maternal Grandmother   . Hypertension Paternal Grandmother   . Diabetes Sister   . Heart disease Sister   . Hypertension Sister   . Hyperlipidemia Sister   . Diabetes Sister   . Heart disease Sister   . Hypertension Sister   . Hyperlipidemia Sister   . Diabetes Brother   . Heart disease Brother   . Hypertension Brother   . Hyperlipidemia Brother   . Colon cancer Neg Hx   . Esophageal cancer Neg Hx   . Stomach cancer Neg Hx   . Rectal cancer Neg Hx     Allergies  Allergen Reactions  . Tramadol     Dizziness     Current Outpatient Prescriptions on File Prior to Visit  Medication Sig Dispense Refill  . acetaminophen (TYLENOL) 500 MG tablet Take 2 tablets (1,000  mg total) by mouth every 6 (six) hours as needed for moderate pain. 30 tablet 0  . ALPRAZolam (XANAX) 0.25 MG tablet Take 1 tablet (0.25 mg total) by mouth 2 (two) times daily as needed for anxiety. 30 tablet 1  . amLODipine (NORVASC) 5 MG tablet Take 1 tablet by mouth  daily 90 tablet 3  . aspirin 81 MG tablet Take 81 mg by mouth daily.    . cholecalciferol (VITAMIN D) 1000 UNITS tablet Take 1,000 Units by mouth daily.      Marland Kitchen dicyclomine (BENTYL) 10 MG capsule Take 1 capsule (10 mg total) by mouth 4 (four)  times daily as needed. 120 capsule 6  . esomeprazole (NEXIUM) 40 MG capsule Take 1 capsule (40 mg total) by mouth daily before breakfast. 30 capsule 11  . FOLBIC 2.5-25-2 MG TABS tablet TAKE 1 TABLET BY MOUTH EVERY DAY 90 tablet 3  . glucose blood test strip Check twice daily and as needed.  DX E11.9 100 each 3  . Lancets MISC Use twice daily to check blood sugar. DX E11.9 200 each 2  . metFORMIN (GLUCOPHAGE-XR) 500 MG 24 hr tablet TAKE 1 TABLET BY MOUTH 3  TIMES DAILY WITH MEALS 270 tablet 1  . metoCLOPramide (REGLAN) 5 MG tablet TAKE 1 TABLET BY MOUTH TWO  TIMES DAILY 180 tablet 1  . metoprolol (LOPRESSOR) 50 MG tablet Take 75 mg in the morning and 50 mg in the evening 225 tablet 3  . Multiple Vitamin (MULTIVITAMIN) tablet Take 1 tablet by mouth daily.      . ondansetron (ZOFRAN) 4 MG tablet Take 4 mg by mouth every 4 (four) hours as needed. For nausea      . potassium chloride (K-DUR,KLOR-CON) 10 MEQ tablet TAKE 1 TABLET BY MOUTH  DAILY 90 tablet 1  . rosuvastatin (CRESTOR) 10 MG tablet Take 1 tablet (10 mg total) by mouth daily. 90 tablet 1  . valsartan (DIOVAN) 320 MG tablet Take 1 tablet by mouth  daily 90 tablet 3  . vitamin E 400 UNIT capsule Take 400 Units by mouth daily.    Marland Kitchen zoster vaccine live, PF, (ZOSTAVAX) 79024 UNT/0.65ML injection Inject 19,400 Units into the skin once. 1 each 0   Current Facility-Administered Medications on File Prior to Visit  Medication Dose Route  Frequency Provider Last Rate Last Dose  . 0.9 %  sodium chloride infusion  500 mL Intravenous Continuous Gatha Mayer, MD        BP (!) 143/66 (BP Location: Left Arm, Patient Position: Sitting, Cuff Size: Large)   Pulse 64   Temp 97.8 F (36.6 C) (Oral)   Resp 16   Ht 5\' 1"  (1.549 m)   Wt 173 lb 6 oz (78.6 kg)   LMP 02/07/1992   SpO2 98%   BMI 32.76 kg/m       Objective:   Physical Exam   General Appearance- Not in acute distress.    Chest and Lung Exam Auscultation: Breath sounds:-Normal. Clear even and unlabored. Adventitious sounds:- No Adventitious sounds.  Cardiovascular Auscultation:Rythm - Regular, rate and rythm. Heart Sounds -Normal heart sounds.  Abdomen Inspection:-Inspection Normal.  Palpation/Perucssion: Palpation and Percussion of the abdomen reveal- Non Tender, No Rebound tenderness, No rigidity(Guarding) and No Palpable abdominal masses.  Liver:-Normal.  Spleen:- Normal.   Back Mid lumbar spine tenderness to palpation. RT si area pain on palpation. Pain on straight leg lift. Pain on lateral movements and flexion/extension of the spine.  Lower ext neurologic  L5-S1 sensation intact bilaterally. Normal patellar reflexes bilaterally. No foot drop bilaterally.   Rt hip- pain on range of motion and palpation.    Assessment & Plan:  For your back pain, sciatica and rt hip pain we gave toradol 60 mg IM. Will avoid oral nsaids due to stomach history. Also avoid pain meds due to your concern on side effects. Will provide low dose xanaflex but only at night use(rx advisement).  Will get xray of lumbar spine and xray of rt hip.  Will refer to sports medicine and they will likely get you back in with PT  Follow up as regularly scheduled with  pcp or with [place] as needed before or after sports med referral.  [name], PA-C

## 2016-04-25 NOTE — Telephone Encounter (Signed)
Will you get pt in with sports med tomorrow or thursday

## 2016-04-25 NOTE — Telephone Encounter (Signed)
Patient is scheduled for 04/27/16

## 2016-04-27 ENCOUNTER — Encounter: Payer: Self-pay | Admitting: Family Medicine

## 2016-04-27 ENCOUNTER — Ambulatory Visit (INDEPENDENT_AMBULATORY_CARE_PROVIDER_SITE_OTHER): Payer: Medicare Other | Admitting: Family Medicine

## 2016-04-27 DIAGNOSIS — M5441 Lumbago with sciatica, right side: Secondary | ICD-10-CM | POA: Diagnosis not present

## 2016-04-27 NOTE — Patient Instructions (Signed)
You have lumbar radiculopathy (a pinched nerve in your low back). Take tylenol for baseline pain relief (1-2 extra strength tabs 3x/day) A prednisone dose pack is the best option for immediate relief and may be prescribed - let me know if you're struggling and want to try this. Avoid ibuprofen and aleve. Stay as active as possible. Physical therapy has been shown to be helpful as well - start this. Strengthening of low back muscles, abdominal musculature are key for long term pain relief. If not improving, will consider further imaging (MRI). Follow up with me in 1 month.

## 2016-05-01 NOTE — Assessment & Plan Note (Signed)
She will start with physical therapy which helped previously when she had similar symptoms.  Tylenol if needed.  Reviewed topical medications.  Has zanaflex but making her sleepy so not using now.  Consider prednisone dose pack if struggling, MRI as well.  F/u in 1 month.

## 2016-05-01 NOTE — Progress Notes (Signed)
PCP and consultation requested by: Penni Homans, MD  Subjective:   HPI: Patient is a 76 y.o. female here for low back pain.  Patient denies known injury or trauma. She reports about 2 weeks ago she started to develop pain in low back radiating down right leg to the foot. Cannot sleep on right side. Associated numbness in same distribution. Pain level 8/10 and sharp. Worse in the morning. Tried aspercreme, heating pad. Toradol injection helped, zanaflex made her too sleepy. No skin changes. No bowel/bladder dysfunction.  Past Medical History:  Diagnosis Date  . Anemia 06/08/2014  . Anxiety   . Arthritis    Spinal Osteoarthritis  . Cancer ([place])   . Carcinoid tumor of stomach   . Cataract   . Chest pain    Myoview 12/15 no ischemia.  . Chronic kidney disease    Left kidney smaller than right kidney  . Diabetes mellitus type 2 in obese ([place]) 09/05/2006   Qualifier: Diagnosis of  By: Marca Ancona RMA, Lucy    . Diabetic peripheral neuropathy ([place]) 10/29/2013  . Diverticulosis 08/30/2000   Colonoscopy   . Encounter for preventative adult health care exam with abnormal findings 09/14/2013  . Esophageal reflux   . Gastric polyp    Fundic Gland  . Gastroparesis   . Headache(784.0)   . Heart murmur    Echocardiogram 2/11: EF 60-65%, mild LAE, grade 1 diastolic dysfunction, aortic valve sclerosis, mean gradient 9 mm of mercury, PASP 34  . Hematuria 03/16/2016  . Iron deficiency anemia, unspecified   . 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 Leg swelling    bilateral  . Neck pain 04/22/2015  . PONV (postoperative nausea and vomiting)    pt states only needs small amount of anesthesia  . PSVT (paroxysmal supraventricular tachycardia) ([place])   . Pure hypercholesterolemia   . 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 Stroke ([place])    tia, 2014  . 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 Type II or unspecified type diabetes mellitus without mention of complication, not stated as uncontrolled   . Unspecified essential hypertension    . Unspecified hereditary and idiopathic peripheral neuropathy 10/29/2013    Current Outpatient Prescriptions on File Prior to Visit  Medication Sig Dispense Refill  . acetaminophen (TYLENOL) 500 MG tablet Take 2 tablets (1,000 mg total) by mouth every 6 (six) hours as needed for moderate pain. 30 tablet 0  . ALPRAZolam (XANAX) 0.25 MG tablet Take 1 tablet (0.25 mg total) by mouth 2 (two) times daily as needed for anxiety. 30 tablet 1  . amLODipine (NORVASC) 5 MG tablet Take 1 tablet by mouth  daily 90 tablet 3  . aspirin 81 MG tablet Take 81 mg by mouth daily.    . cholecalciferol (VITAMIN D) 1000 UNITS tablet Take 1,000 Units by mouth daily.      Marland Kitchen dicyclomine (BENTYL) 10 MG capsule Take 1 capsule (10 mg total) by mouth 4 (four) times daily as needed. 120 capsule 6  . esomeprazole (NEXIUM) 40 MG capsule Take 1 capsule (40 mg total) by mouth daily before breakfast. 30 capsule 11  . FOLBIC 2.5-25-2 MG TABS tablet TAKE 1 TABLET BY MOUTH EVERY DAY 90 tablet 3  . glucose blood test strip Check twice daily and as needed.  DX E11.9 100 each 3  . Lancets MISC Use twice daily to check blood sugar. DX E11.9 200 each 2  . metFORMIN (GLUCOPHAGE-XR) 500 MG 24 hr tablet TAKE 1 TABLET BY MOUTH 3  TIMES DAILY WITH MEALS  270 tablet 1  . metoCLOPramide (REGLAN) 5 MG tablet TAKE 1 TABLET BY MOUTH TWO  TIMES DAILY 180 tablet 1  . metoprolol (LOPRESSOR) 50 MG tablet Take 75 mg in the morning and 50 mg in the evening 225 tablet 3  . Multiple Vitamin (MULTIVITAMIN) tablet Take 1 tablet by mouth daily.      . Multiple Vitamins-Calcium (VIACTIV MULTI-VITAMIN) CHEW Chew 2 each by mouth daily.    . ondansetron (ZOFRAN) 4 MG tablet Take 4 mg by mouth every 4 (four) hours as needed. For nausea      . potassium chloride (K-DUR,KLOR-CON) 10 MEQ tablet TAKE 1 TABLET BY MOUTH  DAILY 90 tablet 1  . rosuvastatin (CRESTOR) 10 MG tablet Take 1 tablet (10 mg total) by mouth daily. 90 tablet 1  . valsartan (DIOVAN) 320 MG tablet  Take 1 tablet by mouth  daily 90 tablet 3  . vitamin E 400 UNIT capsule Take 400 Units by mouth daily.    Marland Kitchen zoster vaccine live, PF, (ZOSTAVAX) 32992 UNT/0.65ML injection Inject 19,400 Units into the skin once. 1 each 0   Current Facility-Administered Medications on File Prior to Visit  Medication Dose Route Frequency Provider Last Rate Last Dose  . 0.9 %  sodium chloride infusion  500 mL Intravenous Continuous Gatha Mayer, MD        Past Surgical History:  Procedure Laterality Date  . CHOLECYSTECTOMY  1993  . COLONOSCOPY  11/11/2010   diverticulosis  . DILATATION & CURRETTAGE/HYSTEROSCOPY WITH RESECTOCOPE N/A 02/25/2013   Procedure: Attempted hysteroscopy with uterine perforation;  Surgeon: Jamey Reas de Berton Lan, MD;  Location: [facility];  Service: Gynecology;  Laterality: N/A;  . ESOPHAGOGASTRODUODENOSCOPY  08/29/2010; 09/15/2010   Carcinoid tumor less than 1 cm in July 2012 not seen in August 2012 , gastritis, fundic gland polyps  . ESOPHAGOGASTRODUODENOSCOPY  05/16/2011  . ESOPHAGOGASTRODUODENOSCOPY  06/14/2012  . EUS  12/15/2010   Procedure: UPPER ENDOSCOPIC ULTRASOUND (EUS) LINEAR;  Surgeon: Owens Loffler, MD;  Location: [facility];  Service: Endoscopy;  Laterality: N/A;  . EYE SURGERY Bilateral    Bi lateral cateracts and bi lateral laser  . LAPAROSCOPY N/A 02/25/2013   Procedure: Cystoscopy and laparoscopy with fulguration of uterine serosa;  Surgeon: Jamey Reas de Berton Lan, MD;  Location: [facility];  Service: Gynecology;  Laterality: N/A;  . TONSILLECTOMY      Allergies  Allergen Reactions  . Tramadol     Dizziness     Social History   Social History  . Marital status: Married    Spouse name: N/A  . Number of children: 0  . Years of education: college   Occupational History  . Retail    Social History Main Topics  . Smoking status: Never Smoker  . Smokeless tobacco: Never Used     Comment: Never used tobacco  . Alcohol use No  . Drug use:  No  . Sexual activity: No     Comment: lives alone, no dietary restrictions except avoid fresh veg, fruit, whole grains   Other Topics Concern  . Not on file   Social History Narrative   Patient was married ([name]) - widow   Patient does not have any children.   Patient is right-handed.   Patient has a BA degree.   One caffeine drink daily     Family History  Problem Relation Age of Onset  . Diabetes Mother   . Stroke Father  deceased age 70  . Heart disease Sister     deceased MI age 6  . Heart disease Brother     deceased MI age 71  . Diabetes Maternal Grandmother   . Hypertension Paternal Grandmother   . Diabetes Sister   . Heart disease Sister   . Hypertension Sister   . Hyperlipidemia Sister   . Diabetes Sister   . Heart disease Sister   . Hypertension Sister   . Hyperlipidemia Sister   . Diabetes Brother   . Heart disease Brother   . Hypertension Brother   . Hyperlipidemia Brother   . Colon cancer Neg Hx   . Esophageal cancer Neg Hx   . Stomach cancer Neg Hx   . Rectal cancer Neg Hx     BP (!) 143/74   Pulse 60   Ht 5\' 1"  (1.549 m)   Wt 172 lb (78 kg)   LMP 02/07/1992   BMI 32.50 kg/m   Review of Systems: See HPI above.     Objective:  Physical Exam:  Gen: NAD, comfortable in exam room  Back: No gross deformity, scoliosis. TTP mildly right lumbar paraspinal region.  No midline or bony TTP. FROM. Strength LEs 5/5 all muscle groups.   2+ MSRs in patellar and achilles tendons, equal bilaterally. Negative SLRs. Sensation intact to light touch bilaterally. Negative logroll bilateral hips Negative fabers and piriformis stretches..   Assessment & Plan:  1. Lumbar radiculopathy - She will start with physical therapy which helped previously when she had similar symptoms.  Tylenol if needed.  Reviewed topical medications.  Has zanaflex but making her sleepy so not using now.  Consider prednisone dose pack if struggling, MRI as well.  F/u in 1  month.

## 2016-05-03 ENCOUNTER — Ambulatory Visit: Payer: Medicare Other | Attending: Family Medicine | Admitting: Physical Therapy

## 2016-05-03 DIAGNOSIS — R262 Difficulty in walking, not elsewhere classified: Secondary | ICD-10-CM | POA: Insufficient documentation

## 2016-05-03 DIAGNOSIS — R293 Abnormal posture: Secondary | ICD-10-CM

## 2016-05-03 DIAGNOSIS — M5441 Lumbago with sciatica, right side: Secondary | ICD-10-CM | POA: Diagnosis not present

## 2016-05-03 NOTE — Patient Instructions (Signed)

## 2016-05-03 NOTE — Therapy (Addendum)
[facility] [address]  [place], [place], [address] Phone: [phone]   Fax:  [phone]  Physical Therapy Evaluation  Patient Details  Name: [name] MRN: [id number] Date of Birth: [birth year] Referring Provider: Penni Homans, MD  Encounter Date: 05/03/2016      PT End of Session - 05/03/16 0932    Visit Number 1   Number of Visits 12   Date for PT Re-Evaluation 06/16/16   Authorization Type AARP - VL: [place]   PT Start Time 0932   PT Stop Time 1022   PT Time Calculation (min) 50 min   Activity Tolerance Patient tolerated treatment well   Behavior During Therapy [facility] for tasks assessed/performed      Past Medical History:  Diagnosis Date  . Anemia 06/08/2014  . Anxiety   . Arthritis    Spinal Osteoarthritis  . Cancer ([place])   . Carcinoid tumor of stomach   . Cataract   . Chest pain    Myoview 12/15 no ischemia.  . Chronic kidney disease    Left kidney smaller than right kidney  . Diabetes mellitus type 2 in obese ([place]) 09/05/2006   Qualifier: Diagnosis of  By: Marca Ancona RMA, Lucy    . Diabetic peripheral neuropathy ([place]) 10/29/2013  . Diverticulosis 08/30/2000   Colonoscopy   . Encounter for preventative adult health care exam with abnormal findings 09/14/2013  . Esophageal reflux   . Gastric polyp    Fundic Gland  . Gastroparesis   . Headache(784.0)   . Heart murmur    Echocardiogram 2/11: EF 60-65%, mild LAE, grade 1 diastolic dysfunction, aortic valve sclerosis, mean gradient 9 mm of mercury, PASP 34  . Hematuria 03/16/2016  . Iron deficiency anemia, unspecified   . 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 Leg swelling    bilateral  . Neck pain 04/22/2015  . PONV (postoperative nausea and vomiting)    pt states only needs small amount of anesthesia  . PSVT (paroxysmal supraventricular tachycardia) ([place])   . Pure hypercholesterolemia   . 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 Stroke ([place])    tia, 2014  . 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 Type II or unspecified type diabetes mellitus without mention of complication, not stated as uncontrolled   . Unspecified essential hypertension   . Unspecified hereditary and idiopathic peripheral neuropathy 10/29/2013    Past Surgical History:  Procedure Laterality Date  . CHOLECYSTECTOMY  1993  . COLONOSCOPY  11/11/2010   diverticulosis  . DILATATION & CURRETTAGE/HYSTEROSCOPY WITH RESECTOCOPE N/A 02/25/2013   Procedure: Attempted hysteroscopy with uterine perforation;  Surgeon: Jamey Reas de Berton Lan, MD;  Location: [facility];  Service: Gynecology;  Laterality: N/A;  . ESOPHAGOGASTRODUODENOSCOPY  08/29/2010; 09/15/2010   Carcinoid tumor less than 1 cm in July 2012 not seen in August 2012 , gastritis, fundic gland polyps  . ESOPHAGOGASTRODUODENOSCOPY  05/16/2011  . ESOPHAGOGASTRODUODENOSCOPY  06/14/2012  . EUS  12/15/2010   Procedure: UPPER ENDOSCOPIC ULTRASOUND (EUS) LINEAR;  Surgeon: Owens Loffler, MD;  Location: [facility];  Service: Endoscopy;  Laterality: N/A;  . EYE SURGERY Bilateral    Bi lateral cateracts and bi lateral laser  . LAPAROSCOPY N/A 02/25/2013   Procedure: Cystoscopy and laparoscopy with fulguration of uterine serosa;  Surgeon: Jamey Reas de Berton Lan, MD;  Location: [facility];  Service: Gynecology;  Laterality: N/A;  . TONSILLECTOMY      There were no vitals filed for this  visit.       Subjective Assessment - 05/03/16 0938    Subjective Pt reports her back started hurting about 3 weeks ago, but now by the time she has been able to come to PT, the pain is lessening. Has been doing exercises from prior bout of PT at least 2x/wk since discharged from PT, but no sure why the pain has come back. States she had been doing well since last PT episode w/o pain.   Limitations Sitting;Standing;Walking   How long can you sit comfortably? 30 minutes   How long can you walk comfortably? 5-10 minutes, cannot walk longer than 15 minutes    Diagnostic tests 04/25/16 - Lumbar x-ray: L2-3, L3-4 and L4-5 minimal disc space narrowing; L4-5 and L5-S1 facet degenerative changes; Aortic atherosclerosis. Hip x-ray: No acute abnormality.   Patient Stated Goals To be able travel w/o limitation d/t pain. (planning trip to [facility])   Currently in Pain? Yes   Pain Score 8    Pain Location Back   Pain Orientation Lower;Right   Pain Descriptors / Indicators Burning;Aching   Pain Type Acute pain;Chronic pain   Pain Radiating Towards through R buttock and down posteriolateral R leg to calf   Pain Onset 1 to 4 weeks ago  ~3 weeks   Aggravating Factors  cold temp, worse in the morning, prolonged sitting, walking   Pain Relieving Factors Aspercreme   Effect of Pain on Daily Activities limits ability to clean, had to cut shopping trip short, limited walking tolerance            [facility] PT Assessment - 05/03/16 0932      Assessment   Medical Diagnosis Low back pain + R sciatica   Referring Provider Penni Homans, MD   Onset Date/Surgical Date --  ~ 3 weeks   Next MD Visit 06/15/16; 05/29/16 with Karlton Lemon, MD   Prior Therapy PT for neck & low back in 2017 & 2016     Balance Screen   Has the patient fallen in the past 6 months No   Has the patient had a decrease in activity level because of a fear of falling?  No   Is the patient reluctant to leave their home because of a fear of falling?  No     Home Environment   Living Environment Private residence   Living Arrangements Alone   Type of [place] Access Level entry   Home Layout One level     Prior Function   Level of [place] Retired   Leisure reading Dentist; HEP from prior PT episodes 2x/wk     Observation/Other Assessments   Focus on Therapeutic Outcomes (FOTO)  Lumbar spine - 30% (70% limitation); predicted 52% (48% limitation)     Posture/Postural Control   Posture/Postural Control Postural limitations   Postural  Limitations Forward head;Rounded Shoulders;Increased thoracic kyphosis;Decreased lumbar lordosis     ROM / Strength   AROM / PROM / Strength AROM;Strength     AROM   Overall AROM Comments pain with all motions   AROM Assessment Site Lumbar   Lumbar Flexion hands to distal knees   Lumbar Extension 25%   Lumbar - Right Side Bend hand to lateral hip   Lumbar - Left Side Bend hand to lateral hip   Lumbar - Right Rotation 25%   Lumbar - Left Rotation 50%     Strength   Strength Assessment Site Hip  Right/Left Hip Right;Left   Right Hip Flexion 4-/5   Right Hip Extension 3-/5   Right Hip External Rotation  3+/5   Right Hip Internal Rotation 4-/5   Right Hip ABduction 4-/5   Right Hip ADduction 3/5   Left Hip Flexion 4-/5   Left Hip Extension 3-/5   Left Hip External Rotation 3+/5   Left Hip Internal Rotation 4-/5   Left Hip ABduction 4-/5   Left Hip ADduction 3/5     Flexibility   Soft Tissue Assessment /Muscle Length yes  tight heelcord B   Hamstrings moderately tight B   Quadriceps moderately tight B   ITB mildly tight B   Piriformis mod/severe tight B     Palpation   Palpation comment ttp over B SIJ & piriformis R>L                   [facility] Adult PT Treatment/Exercise - 05/03/16 0932      Self-Care   Self-Care Posture   Posture Pt provided posture and body mechanics handout with empahsis on sleeping positions and bed mobility today. Pt to review remainder of handout at home with further education to be provided at next visit.     Modalities   Modalities Moist Heat  pt declined estim     Moist Heat Therapy   Number Minutes Moist Heat 10 Minutes   Moist Heat Location Lumbar Spine                PT Education - 05/03/16 1018    Education provided Yes   Education Details PT eval findings & POC; Posture & body mechanics - edcuation provided on proper sleeping positions and bed mobility today. Pt to review remainder of handout at home with further  education to be provided at next visit.   Person(s) Educated Patient   Methods Explanation;Demonstration;Handout   Comprehension Verbalized understanding;Need further instruction          PT Short Term Goals - 05/03/16 1400      PT SHORT TERM GOAL #1   Title Independent with initial HEP by 05/19/16   Status New           PT Long Term Goals - 05/03/16 1401      PT LONG TERM GOAL #1   Title Independent with advanced HEP as indicated by 06/16/16   Status New     PT LONG TERM GOAL #2   Title Pt will demonstrate lumbar ROM 50% of normal or greater w/o increased pain by 06/16/16   Status New     PT LONG TERM GOAL #3   Title Pt will report ability to perform normal ADL's and light household chores without increased low back or leg pain by 06/16/16   Status New     PT LONG TERM GOAL #4   Title Pt will be able to walk long enough to complete normal shopping trip w/o limitation due to LBP or radicular pain by 06/16/16   Status New               Plan - 05/03/16 1015    Clinical Impression Statement [name] is a 76 y/o female who presents to OP PT with new acute on chronic right sided LBP with new R LE radiculopathy originating ~3 weeks ago. Pt was previously seen by PT for LBP from 05/2015 - 07/2015 with good relief of symptoms and reports she has continued with HEP 2x/wk since discharge. Pt unable to identify triggering  event for current exacerbation of LBP, but has noted some improvement since onset of symptoms although currently rating pain at 8/10.  Pt leads mostly sedentary lifestyle spending most of her time reading or sitting at her computer. Assessment revealed limited lumbar ROM in all directions with pain during all motions. Pt demonstrates forward head and rounded shoulder posture with increased thoracic kyphosis and decreased lumbar lordosis. Proximal LE flexibility significantly restricted R>L with greatest restriction in hamstrings and piriformis. Positive SLR test  bilaterally, but more symptomatic on R. MMT reveals moderate LE weakness with greatest limitation in B hip extension. Pt will benefit from skilled PT to focus on postural and body mechanics education to reduce lumbar strain with daily activities, increasing LE flexibility, core/lumbar stabilization and LE strengthening, along with manual therapy and modalities PRN for pain management. May consider mechanical traction if radicular symptoms persist and/or dry needling for pain/increased muscle tension.   Rehab Potential Good   Clinical Impairments Affecting Rehab Potential extensive PMH including chronic back and neck pain, DM, HTN, h/o TIA, peripheral neuropathy (refer to problem list & history for full PMH)   PT Frequency 2x / week   PT Duration 6 weeks   PT Treatment/Interventions Patient/family education;Neuromuscular re-education;Therapeutic exercise;Therapeutic activities;Functional mobility training;Manual techniques;Dry needling;Taping;Electrical Stimulation;Moist Heat;Cryotherapy;Traction;Ultrasound;Iontophoresis 4mg /ml Dexamethasone;ADLs/Self Care Home Management   PT Next Visit Plan Review posture & body mechanics handout; Create initial HEP; Manual therapy & modalities PRN for pain   Consulted and Agree with Plan of Care Patient      Patient will benefit from skilled therapeutic intervention in order to improve the following deficits and impairments:  Pain, Increased muscle spasms, Impaired flexibility, Decreased range of motion, Decreased strength, Postural dysfunction, Improper body mechanics, Difficulty walking, Decreased activity tolerance  Visit Diagnosis: Bilateral low back pain with right-sided sciatica, unspecified chronicity - Plan: PT plan of care cert/re-cert  Difficulty in walking, not elsewhere classified - Plan: PT plan of care cert/re-cert  Abnormal posture - Plan: PT plan of care cert/re-cert      G-Codes - 56/38/75 1414    Functional Assessment Tool Used (Outpatient  Only) Lumbar spine FOTO = 30% (70% limitation)   Functional Limitation Mobility: Walking and moving around   Mobility: Walking and Moving Around Current Status (I4332) At least 60 percent but less than 80 percent impaired, limited or restricted   Mobility: Walking and Moving Around Goal Status (R5188) At least 40 percent but less than 60 percent impaired, limited or restricted       Problem List Patient Active Problem List   Diagnosis Date Noted  . Hematuria 03/16/2016  . 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 Pain of upper abdomen 06/06/2015  . Neck pain 04/22/2015  . Ear pain 02/28/2015  . Abnormal urine 11/21/2014  . 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 Anemia 06/08/2014  . RLS (restless legs syndrome) 11/23/2013  . Diabetic peripheral neuropathy ([place]) 10/29/2013  . Left-sided thoracic back pain 10/06/2013  . Encounter for preventative adult health care exam with abnormal findings 09/14/2013  . Iron deficiency anemia   . Status post laparoscopy 02/25/2013  . GERD (gastroesophageal reflux disease) 01/09/2013  . Amaurosis fugax of left eye 10/16/2012  . Low back pain 06/03/2012  . Vitamin D deficiency 03/11/2012  . Bilateral hand pain 10/20/2011  . Encounter for long-term (current) use of other medications 10/20/2011  . IBS (irritable bowel syndrome) 08/14/2011  . TIA (transient ischemic attack) 02/10/2011  . Abnormal brain MRI 01/19/2011  . Allergic rhinitis  10/01/2010  . 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 FUNDIC GLAND POLYPS OF STOMACH 03/18/2010  . EPIGASTRIC PAIN chronic 03/18/2010  . ARTHRALGIA 03/17/2009  . SYSTOLIC MURMUR 16/11/9602  . Paroxysmal supraventricular tachycardia ([place]) 01/12/2009  . PLANTAR FASCIITIS 06/08/2008  . CHEST PAIN 05/18/2008  . Gastroparesis 12/18/2007  . HYPERCHOLESTEROLEMIA 06/11/2007  . Diabetes mellitus type 2 in obese ([place]) 09/05/2006  . Essential hypertension 09/05/2006    Percival Spanish, PT,  MPT 05/03/2016, 2:22 PM  [facility] [address]  [place], [place], [address] Phone: [phone]   Fax:  [phone]  Name: [name] MRN: [id number] Date of Birth: 02-16-1940

## 2016-05-05 ENCOUNTER — Ambulatory Visit: Payer: Medicare Other | Admitting: Physical Therapy

## 2016-05-05 DIAGNOSIS — M5441 Lumbago with sciatica, right side: Secondary | ICD-10-CM | POA: Diagnosis not present

## 2016-05-05 DIAGNOSIS — R262 Difficulty in walking, not elsewhere classified: Secondary | ICD-10-CM

## 2016-05-05 DIAGNOSIS — R293 Abnormal posture: Secondary | ICD-10-CM

## 2016-05-05 NOTE — Therapy (Signed)
[facility] [address]  [place], [place], [address] Phone: [phone]   Fax:  [phone]  Physical Therapy Treatment  Patient Details  Name: [name] MRN: [id number] Date of Birth: [birth year] Referring Provider: Penni Homans, MD  Encounter Date: 05/05/2016      PT End of Session - 05/05/16 0932    Visit Number 2   Number of Visits 12   Date for PT Re-Evaluation 06/16/16   Authorization Type AARP - VL: [place]   PT Start Time 0932   PT Stop Time 1026   PT Time Calculation (min) 54 min   Activity Tolerance Patient tolerated treatment well   Behavior During Therapy [facility] for tasks assessed/performed      Past Medical History:  Diagnosis Date  . Anemia 06/08/2014  . Anxiety   . Arthritis    Spinal Osteoarthritis  . Cancer ([place])   . Carcinoid tumor of stomach   . Cataract   . Chest pain    Myoview 12/15 no ischemia.  . Chronic kidney disease    Left kidney smaller than right kidney  . Diabetes mellitus type 2 in obese ([place]) 09/05/2006   Qualifier: Diagnosis of  By: Marca Ancona RMA, Lucy    . Diabetic peripheral neuropathy ([place]) 10/29/2013  . Diverticulosis 08/30/2000   Colonoscopy   . Encounter for preventative adult health care exam with abnormal findings 09/14/2013  . Esophageal reflux   . Gastric polyp    Fundic Gland  . Gastroparesis   . Headache(784.0)   . Heart murmur    Echocardiogram 2/11: EF 60-65%, mild LAE, grade 1 diastolic dysfunction, aortic valve sclerosis, mean gradient 9 mm of mercury, PASP 34  . Hematuria 03/16/2016  . Iron deficiency anemia, unspecified   . 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 Leg swelling    bilateral  . Neck pain 04/22/2015  . PONV (postoperative nausea and vomiting)    pt states only needs small amount of anesthesia  . PSVT (paroxysmal supraventricular tachycardia) ([place])   . Pure hypercholesterolemia   . 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 Stroke ([place])    tia, 2014  . 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 Type II or unspecified type diabetes mellitus without mention of complication, not stated as uncontrolled   . Unspecified essential hypertension   . Unspecified hereditary and idiopathic peripheral neuropathy 10/29/2013    Past Surgical History:  Procedure Laterality Date  . CHOLECYSTECTOMY  1993  . COLONOSCOPY  11/11/2010   diverticulosis  . DILATATION & CURRETTAGE/HYSTEROSCOPY WITH RESECTOCOPE N/A 02/25/2013   Procedure: Attempted hysteroscopy with uterine perforation;  Surgeon: Jamey Reas de Berton Lan, MD;  Location: [facility];  Service: Gynecology;  Laterality: N/A;  . ESOPHAGOGASTRODUODENOSCOPY  08/29/2010; 09/15/2010   Carcinoid tumor less than 1 cm in July 2012 not seen in August 2012 , gastritis, fundic gland polyps  . ESOPHAGOGASTRODUODENOSCOPY  05/16/2011  . ESOPHAGOGASTRODUODENOSCOPY  06/14/2012  . EUS  12/15/2010   Procedure: UPPER ENDOSCOPIC ULTRASOUND (EUS) LINEAR;  Surgeon: Owens Loffler, MD;  Location: [facility];  Service: Endoscopy;  Laterality: N/A;  . EYE SURGERY Bilateral    Bi lateral cateracts and bi lateral laser  . LAPAROSCOPY N/A 02/25/2013   Procedure: Cystoscopy and laparoscopy with fulguration of uterine serosa;  Surgeon: Jamey Reas de Berton Lan, MD;  Location: [facility];  Service: Gynecology;  Laterality: N/A;  . TONSILLECTOMY      There were no vitals filed for this  visit.      Subjective Assessment - 05/05/16 0938    Patient Stated Goals To be able travel w/o limitation d/t pain. (planning trip to [facility])   Currently in Pain? Yes   Pain Score 8    Pain Location Back   Pain Orientation Lower;Medial                         [facility] Adult PT Treatment/Exercise - 05/05/16 0001      Exercises   Exercises Lumbar     Lumbar Exercises: Stretches   Passive Hamstring Stretch 30 seconds;2 reps   Passive Hamstring Stretch Limitations supine with strap   Single Knee to Chest Stretch 30  seconds;2 reps   Lower Trunk Rotation 5 reps;10 seconds   Pelvic Tilt Limitations 10x5"   Piriformis Stretch 30 seconds;4 reps   Piriformis Stretch Limitations 2 reps each - figure 4 & KTOS     Lumbar Exercises: Aerobic   Stationary Bike NuStep - lvl 3 x 6'     Lumbar Exercises: Supine   Ab Set 10 reps;5 seconds   AB Set Limitations + alt bent knee fall out   Bridge 10 reps;3 seconds   Bridge Limitations pt c/o increased discomfort after this exercise     Modalities   Modalities Moist Heat  pt declined estim     Moist Heat Therapy   Number Minutes Moist Heat 10 Minutes   Moist Heat Location Lumbar Spine  R sidelying with pillow btw knees d/t uncomfortable supine                PT Education - 05/05/16 1015    Education provided Yes   Education Details Initial HEP   Person(s) Educated Patient   Methods Explanation;Demonstration;Handout   Comprehension Verbalized understanding;Returned demonstration;Need further instruction          PT Short Term Goals - 05/05/16 0939      PT SHORT TERM GOAL #1   Title Independent with initial HEP by 05/19/16   Status On-going           PT Long Term Goals - 05/05/16 0939      PT LONG TERM GOAL #1   Title Independent with advanced HEP as indicated by 06/16/16   Status On-going     PT LONG TERM GOAL #2   Title Pt will demonstrate lumbar ROM 50% of normal or greater w/o increased pain by 06/16/16   Status On-going     PT LONG TERM GOAL #3   Title Pt will report ability to perform normal ADL's and light household chores without increased low back or leg pain by 06/16/16   Status On-going     PT LONG TERM GOAL #4   Title Pt will be able to walk long enough to complete normal shopping trip w/o limitation due to LBP or radicular pain by 06/16/16   Status On-going               Plan - 05/05/16 0940    Clinical Impression Statement Pt reporting no questions after reading posture and body mechanics education handout,  but will continue to incorporate postural awareness and body mechanics education into therapy sessions as indicated. Training provided in intial HEP focusing on stretches to improve flexibility and core activation in preparation for progression to strnegthening exercises with pt able to demonstrate all exercises appropriately, but will most likely require continued training to ensure consistent technique. Pt noting  mild increased pain/soreness by end of session but declined TENS/estim again today, requesting only moist heat pad.   Rehab Potential Good   Clinical Impairments Affecting Rehab Potential extensive PMH including chronic back and neck pain, DM, HTN, h/o TIA, peripheral neuropathy (refer to problem list & history for full PMH)   PT Treatment/Interventions Patient/family education;Neuromuscular re-education;Therapeutic exercise;Therapeutic activities;Functional mobility training;Manual techniques;Dry needling;Taping;Electrical Stimulation;Moist Heat;Cryotherapy;Traction;Ultrasound;Iontophoresis 4mg /ml Dexamethasone;ADLs/Self Care Home Management   PT Next Visit Plan Review initial HEP; Incorporate posture & body mechanics education into daily treatment sessions as indicated; Lumbar/proximal LE flexibility; Core strengthening;  Manual therapy & modalities PRN for pain   Consulted and Agree with Plan of Care Patient      Patient will benefit from skilled therapeutic intervention in order to improve the following deficits and impairments:  Pain, Increased muscle spasms, Impaired flexibility, Decreased range of motion, Decreased strength, Postural dysfunction, Improper body mechanics, Difficulty walking, Decreased activity tolerance  Visit Diagnosis: Bilateral low back pain with right-sided sciatica, unspecified chronicity  Difficulty in walking, not elsewhere classified  Abnormal posture     Problem List Patient Active Problem List   Diagnosis Date Noted  . Hematuria 03/16/2016  .  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 Pain of upper abdomen 06/06/2015  . Neck pain 04/22/2015  . Ear pain 02/28/2015  . Abnormal urine 11/21/2014  . 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 Anemia 06/08/2014  . RLS (restless legs syndrome) 11/23/2013  . Diabetic peripheral neuropathy ([place]) 10/29/2013  . Left-sided thoracic back pain 10/06/2013  . Encounter for preventative adult health care exam with abnormal findings 09/14/2013  . Iron deficiency anemia   . Status post laparoscopy 02/25/2013  . GERD (gastroesophageal reflux disease) 01/09/2013  . Amaurosis fugax of left eye 10/16/2012  . Low back pain 06/03/2012  . Vitamin D deficiency 03/11/2012  . Bilateral hand pain 10/20/2011  . Encounter for long-term (current) use of other medications 10/20/2011  . IBS (irritable bowel syndrome) 08/14/2011  . TIA (transient ischemic attack) 02/10/2011  . Abnormal brain MRI 01/19/2011  . Allergic rhinitis 10/01/2010  . 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 FUNDIC GLAND POLYPS OF STOMACH 03/18/2010  . EPIGASTRIC PAIN chronic 03/18/2010  . ARTHRALGIA 03/17/2009  . SYSTOLIC MURMUR 38/18/2993  . Paroxysmal supraventricular tachycardia ([place]) 01/12/2009  . PLANTAR FASCIITIS 06/08/2008  . CHEST PAIN 05/18/2008  . Gastroparesis 12/18/2007  . HYPERCHOLESTEROLEMIA 06/11/2007  . Diabetes mellitus type 2 in obese ([place]) 09/05/2006  . Essential hypertension 09/05/2006    Percival Spanish, PT, MPT 05/05/2016, 11:31 AM  [facility] [address]  [place], [place], [address] Phone: [phone]   Fax:  [phone]  Name: [name] MRN: [id number] Date of Birth: 03-13-1940

## 2016-05-08 ENCOUNTER — Ambulatory Visit (HOSPITAL_BASED_OUTPATIENT_CLINIC_OR_DEPARTMENT_OTHER): Payer: Medicare Other | Admitting: Hematology & Oncology

## 2016-05-08 ENCOUNTER — Other Ambulatory Visit (HOSPITAL_BASED_OUTPATIENT_CLINIC_OR_DEPARTMENT_OTHER): Payer: Medicare Other

## 2016-05-08 VITALS — BP 127/61 | HR 68 | Temp 98.2°F | Resp 18 | Wt 172.1 lb

## 2016-05-08 DIAGNOSIS — D508 Other iron deficiency anemias: Secondary | ICD-10-CM | POA: Diagnosis not present

## 2016-05-08 DIAGNOSIS — D500 Iron deficiency anemia secondary to blood loss (chronic): Secondary | ICD-10-CM

## 2016-05-08 DIAGNOSIS — D509 Iron deficiency anemia, unspecified: Secondary | ICD-10-CM

## 2016-05-08 DIAGNOSIS — K909 Intestinal malabsorption, unspecified: Secondary | ICD-10-CM

## 2016-05-08 LAB — CMP (CANCER CENTER ONLY)
ALK PHOS: 56 U/L (ref 26–84)
ALT: 29 U/L (ref 10–47)
AST: 28 U/L (ref 11–38)
Albumin: 3.7 g/dL (ref 3.3–5.5)
BUN: 16 mg/dL (ref 7–22)
CO2: 26 mEq/L (ref 18–33)
CREATININE: 0.9 mg/dL (ref 0.6–1.2)
Calcium: 9.4 mg/dL (ref 8.0–10.3)
Chloride: 102 mEq/L (ref 98–108)
GLUCOSE: 143 mg/dL — AB (ref 73–118)
Potassium: 4.2 mEq/L (ref 3.3–4.7)
SODIUM: 136 meq/L (ref 128–145)
Total Bilirubin: 0.9 mg/dl (ref 0.20–1.60)
Total Protein: 7.2 g/dL (ref 6.4–8.1)

## 2016-05-08 LAB — CBC WITH DIFFERENTIAL (CANCER CENTER ONLY)
BASO#: 0 10*3/uL (ref 0.0–0.2)
BASO%: 0.5 % (ref 0.0–2.0)
EOS%: 1.1 % (ref 0.0–7.0)
Eosinophils Absolute: 0.1 10*3/uL (ref 0.0–0.5)
HCT: 37.8 % (ref 34.8–46.6)
HEMOGLOBIN: 13 g/dL (ref 11.6–15.9)
LYMPH#: 2.2 10*3/uL (ref 0.9–3.3)
LYMPH%: 28.5 % (ref 14.0–48.0)
MCH: 29.9 pg (ref 26.0–34.0)
MCHC: 34.4 g/dL (ref 32.0–36.0)
MCV: 87 fL (ref 81–101)
MONO#: 0.8 10*3/uL (ref 0.1–0.9)
MONO%: 10.4 % (ref 0.0–13.0)
NEUT%: 59.5 % (ref 39.6–80.0)
NEUTROS ABS: 4.5 10*3/uL (ref 1.5–6.5)
Platelets: 227 10*3/uL (ref 145–400)
RBC: 4.35 10*6/uL (ref 3.70–5.32)
RDW: 12.1 % (ref 11.1–15.7)
WBC: 7.6 10*3/uL (ref 3.9–10.0)

## 2016-05-08 LAB — RETICULOCYTES: RETICULOCYTE COUNT: 1 % (ref 0.6–2.6)

## 2016-05-08 LAB — CHCC SATELLITE - SMEAR

## 2016-05-08 NOTE — Progress Notes (Signed)
Hematology and Oncology Follow Up Visit  [name] [id number] 1940/03/25 76 y.o. 05/08/2016   Principle Diagnosis:   Iron deficiency anemia secondary to malabsorption  Current Therapy:    IV iron-patient received a dose back in October 2017     Interim History:  [name] is back for follow-up. She is  looking fairly good. She has had no problems since we last saw her. We did go ahead and give her dose of IV iron back in October. Her iron studies at that time showed her ferritin V1 95 but an iron saturation low at 19%. Her serum iron was 63. We did go ahead and give her a dose.  She feels well. She will be going to [place] the end of May. She is not sure long she will be staying. Is benign years since she last went. Apparently, she has a sister who lives in [place].   She's had no increase in fatigue or weakness. Her blood sugars have been doing pretty well. She's had her last hemoglobin A1c was 6.4.   She's had no obvious bleeding or bruising.   She's had no rashes. She's had no leg swelling. There's not been any change in medications.   She has had no weight loss or weight gain.   Overall, her performance status is ECOG 1.  Medications:  Current Outpatient Prescriptions:  .  acetaminophen (TYLENOL) 500 MG tablet, Take 2 tablets (1,000 mg total) by mouth every 6 (six) hours as needed for moderate pain., Disp: 30 tablet, Rfl: 0 .  ALPRAZolam (XANAX) 0.25 MG tablet, Take 1 tablet (0.25 mg total) by mouth 2 (two) times daily as needed for anxiety., Disp: 30 tablet, Rfl: 1 .  amLODipine (NORVASC) 5 MG tablet, Take 1 tablet by mouth  daily, Disp: 90 tablet, Rfl: 3 .  aspirin 81 MG tablet, Take 81 mg by mouth daily., Disp: , Rfl:  .  cholecalciferol (VITAMIN D) 1000 UNITS tablet, Take 1,000 Units by mouth daily.  , Disp: , Rfl:  .  dicyclomine (BENTYL) 10 MG capsule, Take 1 capsule (10 mg total) by mouth 4 (four) times daily as needed., Disp: 120 capsule, Rfl: 6 .   esomeprazole (NEXIUM) 40 MG capsule, Take 1 capsule (40 mg total) by mouth daily before breakfast., Disp: 30 capsule, Rfl: 11 .  FOLBIC 2.5-25-2 MG TABS tablet, TAKE 1 TABLET BY MOUTH EVERY DAY, Disp: 90 tablet, Rfl: 3 .  glucose blood test strip, Check twice daily and as needed.  DX E11.9, Disp: 100 each, Rfl: 3 .  Lancets MISC, Use twice daily to check blood sugar. DX E11.9, Disp: 200 each, Rfl: 2 .  metFORMIN (GLUCOPHAGE-XR) 500 MG 24 hr tablet, TAKE 1 TABLET BY MOUTH 3  TIMES DAILY WITH MEALS, Disp: 270 tablet, Rfl: 1 .  metoCLOPramide (REGLAN) 5 MG tablet, TAKE 1 TABLET BY MOUTH TWO  TIMES DAILY, Disp: 180 tablet, Rfl: 1 .  metoprolol (LOPRESSOR) 50 MG tablet, Take 75 mg in the morning and 50 mg in the evening, Disp: 225 tablet, Rfl: 3 .  Multiple Vitamin (MULTIVITAMIN) tablet, Take 1 tablet by mouth daily.  , Disp: , Rfl:  .  Multiple Vitamins-Calcium (VIACTIV MULTI-VITAMIN) CHEW, Chew 2 each by mouth daily., Disp: , Rfl:  .  ondansetron (ZOFRAN) 4 MG tablet, Take 4 mg by mouth every 4 (four) hours as needed. For nausea  , Disp: , Rfl:  .  potassium chloride (K-DUR,KLOR-CON) 10 MEQ tablet, TAKE 1 TABLET BY MOUTH  DAILY, Disp: 90 tablet, Rfl: 1 .  rosuvastatin (CRESTOR) 10 MG tablet, Take 1 tablet (10 mg total) by mouth daily., Disp: 90 tablet, Rfl: 1 .  tiZANidine (ZANAFLEX) 4 MG tablet, , Disp: , Rfl: 0 .  valsartan (DIOVAN) 320 MG tablet, Take 1 tablet by mouth  daily, Disp: 90 tablet, Rfl: 3 .  vitamin E 400 UNIT capsule, Take 400 Units by mouth daily., Disp: , Rfl:  .  zoster vaccine live, PF, (ZOSTAVAX) 61950 UNT/0.65ML injection, Inject 19,400 Units into the skin once., Disp: 1 each, Rfl: 0  Current Facility-Administered Medications:  .  0.9 %  sodium chloride infusion, 500 mL, Intravenous, Continuous, Gatha Mayer, MD  Allergies:  Allergies  Allergen Reactions  . Tramadol     Dizziness     Past Medical History, Surgical history, Social history, and Family History were  reviewed and updated.  Review of Systems: As above  Physical Exam:  weight is 172 lb 1.9 oz (78.1 kg). Her oral temperature is 98.2 F (36.8 C). Her blood pressure is 127/61 and her pulse is 68. Her respiration is 18 and oxygen saturation is 98%.   Wt Readings from Last 3 Encounters:  05/08/16 172 lb 1.9 oz (78.1 kg)  04/27/16 172 lb (78 kg)  04/25/16 173 lb 6 oz (78.6 kg)     Well-developed well-nourished white female in no obvious distress. Head and neck exam shows no ocular or oral lesions. There are no palpable cervical or supraclavicular lymph nodes. Lungs are clear. Cardiac exam regular rate and rhythm with no murmurs, rubs or bruits. Abdomen is soft. She has good bowel sounds. There is no fluid wave. There is no palpable liver or spleen tip. Back exam shows no tenderness over the spine, ribs or hips. Extremities shows no clubbing, cyanosis or edema. Skin exam shows no rashes, ecchymosis or petechia. Neurological exam shows no focal neurological deficits.  Lab Results  Component Value Date   WBC 7.6 05/08/2016   HGB 13.0 05/08/2016   HCT 37.8 05/08/2016   MCV 87 05/08/2016   PLT 227 05/08/2016     Chemistry      Component Value Date/Time   NA 136 05/08/2016 1305   NA 134 (L) 07/08/2015 1149   K 4.2 05/08/2016 1305   K 4.4 07/08/2015 1149   CL 102 05/08/2016 1305   CO2 26 05/08/2016 1305   CO2 25 07/08/2015 1149   BUN 16 05/08/2016 1305   BUN 15.5 07/08/2015 1149   CREATININE 0.9 05/08/2016 1305   CREATININE 0.8 07/08/2015 1149      Component Value Date/Time   CALCIUM 9.4 05/08/2016 1305   CALCIUM 10.0 07/08/2015 1149   ALKPHOS 56 05/08/2016 1305   ALKPHOS 62 07/08/2015 1149   AST 28 05/08/2016 1305   AST 24 07/08/2015 1149   ALT 29 05/08/2016 1305   ALT 30 07/08/2015 1149   BILITOT 0.90 05/08/2016 1305   BILITOT 0.64 07/08/2015 1149         Impression and Plan: [name] is 76 year old white female. She has iron deficiency. I suspect that this is  secondary to malabsorption. She's had stools tested which were negative for blood. I think because of her diabetes, she Probably is not absorbing all that well.  I looked at her blood and the microscope. It looked pretty good. I do not see much in way of hypochromic or microcytic red blood cells. Her MCV was stable.  We will plan to get her back in  another 6 months.   I'm sure that she will have a great time overran [place]. She'll will be going the end of May.  She has a very strong faith. I am very much impressed by this.    Volanda Napoleon, MD 4/2/20182:13 PM.

## 2016-05-09 LAB — FERRITIN: Ferritin: 287 ng/ml — ABNORMAL HIGH (ref 9–269)

## 2016-05-09 LAB — IRON AND TIBC
%SAT: 27 % (ref 21–57)
Iron: 81 ug/dL (ref 41–142)
TIBC: 296 ug/dL (ref 236–444)
UIBC: 215 ug/dL (ref 120–384)

## 2016-05-10 ENCOUNTER — Ambulatory Visit: Payer: Medicare Other | Attending: Family Medicine | Admitting: Physical Therapy

## 2016-05-10 ENCOUNTER — Other Ambulatory Visit: Payer: Self-pay | Admitting: Family Medicine

## 2016-05-10 DIAGNOSIS — R293 Abnormal posture: Secondary | ICD-10-CM | POA: Diagnosis not present

## 2016-05-10 DIAGNOSIS — M5441 Lumbago with sciatica, right side: Secondary | ICD-10-CM | POA: Insufficient documentation

## 2016-05-10 DIAGNOSIS — R262 Difficulty in walking, not elsewhere classified: Secondary | ICD-10-CM | POA: Diagnosis not present

## 2016-05-10 NOTE — Therapy (Signed)
[facility] [address]  [place], [place], [address] Phone: [phone]   Fax:  [phone]  Physical Therapy Treatment  Patient Details  Name: [name] MRN: [id number] Date of Birth: [birth year] Referring Provider: Penni Homans, MD  Encounter Date: 05/10/2016      PT End of Session - 05/10/16 1402    Visit Number 3   Number of Visits 12   Date for PT Re-Evaluation 06/16/16   Authorization Type AARP - VL: [place]   PT Start Time 4193   PT Stop Time 1511   PT Time Calculation (min) 69 min   Activity Tolerance Patient tolerated treatment well   Behavior During Therapy [facility] for tasks assessed/performed      Past Medical History:  Diagnosis Date  . Anemia 06/08/2014  . Anxiety   . Arthritis    Spinal Osteoarthritis  . Cancer ([place])   . Carcinoid tumor of stomach   . Cataract   . Chest pain    Myoview 12/15 no ischemia.  . Chronic kidney disease    Left kidney smaller than right kidney  . Diabetes mellitus type 2 in obese ([place]) 09/05/2006   Qualifier: Diagnosis of  By: Marca Ancona RMA, Lucy    . Diabetic peripheral neuropathy ([place]) 10/29/2013  . Diverticulosis 08/30/2000   Colonoscopy   . Encounter for preventative adult health care exam with abnormal findings 09/14/2013  . Esophageal reflux   . Gastric polyp    Fundic Gland  . Gastroparesis   . Headache(784.0)   . Heart murmur    Echocardiogram 2/11: EF 60-65%, mild LAE, grade 1 diastolic dysfunction, aortic valve sclerosis, mean gradient 9 mm of mercury, PASP 34  . Hematuria 03/16/2016  . Iron deficiency anemia, unspecified   . 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 Leg swelling    bilateral  . Neck pain 04/22/2015  . PONV (postoperative nausea and vomiting)    pt states only needs small amount of anesthesia  . PSVT (paroxysmal supraventricular tachycardia) ([place])   . Pure hypercholesterolemia   . 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 Stroke ([place])    tia, 2014  . 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 Type II or unspecified type diabetes mellitus without mention of complication, not stated as uncontrolled   . Unspecified essential hypertension   . Unspecified hereditary and idiopathic peripheral neuropathy 10/29/2013    Past Surgical History:  Procedure Laterality Date  . CHOLECYSTECTOMY  1993  . COLONOSCOPY  11/11/2010   diverticulosis  . DILATATION & CURRETTAGE/HYSTEROSCOPY WITH RESECTOCOPE N/A 02/25/2013   Procedure: Attempted hysteroscopy with uterine perforation;  Surgeon: Jamey Reas de Berton Lan, MD;  Location: [facility];  Service: Gynecology;  Laterality: N/A;  . ESOPHAGOGASTRODUODENOSCOPY  08/29/2010; 09/15/2010   Carcinoid tumor less than 1 cm in July 2012 not seen in August 2012 , gastritis, fundic gland polyps  . ESOPHAGOGASTRODUODENOSCOPY  05/16/2011  . ESOPHAGOGASTRODUODENOSCOPY  06/14/2012  . EUS  12/15/2010   Procedure: UPPER ENDOSCOPIC ULTRASOUND (EUS) LINEAR;  Surgeon: Owens Loffler, MD;  Location: [facility];  Service: Endoscopy;  Laterality: N/A;  . EYE SURGERY Bilateral    Bi lateral cateracts and bi lateral laser  . LAPAROSCOPY N/A 02/25/2013   Procedure: Cystoscopy and laparoscopy with fulguration of uterine serosa;  Surgeon: Jamey Reas de Berton Lan, MD;  Location: [facility];  Service: Gynecology;  Laterality: N/A;  . TONSILLECTOMY      There were no vitals filed for this  visit.      Subjective Assessment - 05/10/16 1406    Patient Stated Goals To be able travel w/o limitation d/t pain. (planning trip to [facility])   Currently in Pain? Yes   Pain Score 7    Pain Location Back   Pain Orientation Lower;Medial   Pain Descriptors / Indicators Aching   Pain Type Acute pain;Chronic pain   Pain Radiating Towards not currently   Pain Frequency Constant                         [facility] Adult PT Treatment/Exercise - 05/10/16 1402      Self-Care   Self-Care Heat/Ice Application;Other Self-Care Comments    Other Self-Care Comments  Role of estim & use of home TENS unit     Lumbar Exercises: Stretches   Passive Hamstring Stretch 30 seconds;2 reps   Passive Hamstring Stretch Limitations supine with strap   Single Knee to Chest Stretch 30 seconds;2 reps   Double Knee to Chest Stretch 5 reps;10 seconds   Double Knee to Chest Stretch Limitations heels on orange Pball   Lower Trunk Rotation Limitations 10x5" with lower legs on orange Pball   Pelvic Tilt Limitations 10x5"   Piriformis Stretch 30 seconds;4 reps   Piriformis Stretch Limitations 2 reps each - figure 4 & KTOS     Lumbar Exercises: Aerobic   Stationary Bike NuStep - lvl 4 x 6'     Lumbar Exercises: Supine   Ab Set 10 reps;5 seconds   AB Set Limitations + alt bent knee fall out   Clam 10 reps;3 seconds   Clam Limitations hooklying alt unilateral hip ABD/ER with red TB   Bridge 5 reps;3 seconds   Bridge Limitations + hip ABD isometric; stopped d/t c/o lower throracic pain     Lumbar Exercises: Sidelying   Clam 5 reps;10 reps;3 seconds   Clam Limitations L - 5 reps (stopped d/t      Modalities   Modalities Electrical Stimulation;Moist Heat     Moist Heat Therapy   Number Minutes Moist Heat 12 Minutes   Moist Heat Location Lumbar Spine     Electrical Stimulation   Electrical Stimulation Location R SIJ/lumbar spine   Electrical Stimulation Action IFC   Electrical Stimulation Parameters 80-150 Hz, intensity to pt tol x12'   Electrical Stimulation Goals Pain                  PT Short Term Goals - 05/05/16 0939      PT SHORT TERM GOAL #1   Title Independent with initial HEP by 05/19/16   Status On-going           PT Long Term Goals - 05/05/16 0939      PT LONG TERM GOAL #1   Title Independent with advanced HEP as indicated by 06/16/16   Status On-going     PT LONG TERM GOAL #2   Title Pt will demonstrate lumbar ROM 50% of normal or greater w/o increased pain by 06/16/16   Status On-going     PT  LONG TERM GOAL #3   Title Pt will report ability to perform normal ADL's and light household chores without increased low back or leg pain by 06/16/16   Status On-going     PT LONG TERM GOAL #4   Title Pt will be able to walk long enough to complete normal shopping trip w/o limitation due to LBP or radicular  pain by 06/16/16   Status On-going               Plan - 05/10/16 1409    Clinical Impression Statement Pt reporting only completing HEP once since last visit 5 days ago. Upon HEP review pt able to demonstrate all exercises with only minimal cueing necessary. Pt tolerating progression of exercises but did c/o anterior hip pain with L sidelying clam and lower thoracic pain with bridges + hip ABD isometric therefore these exercises discontinued. Pt interested in trying estim today, therefore utilized IFC with moist heat with pt noting benefit afterward. Pt has home TENS unit that belonged to her husband - encouraged pt to bring unit with to next visit for instruction in home use.   Rehab Potential Good   Clinical Impairments Affecting Rehab Potential extensive PMH including chronic back and neck pain, DM, HTN, h/o TIA, peripheral neuropathy (refer to problem list & history for full PMH)   PT Treatment/Interventions Patient/family education;Neuromuscular re-education;Therapeutic exercise;Therapeutic activities;Functional mobility training;Manual techniques;Dry needling;Taping;Electrical Stimulation;Moist Heat;Cryotherapy;Traction;Ultrasound;Iontophoresis 4mg /ml Dexamethasone;ADLs/Self Care Home Management   PT Next Visit Plan Incorporate posture & body mechanics education into daily treatment sessions as indicated; Lumbar/proximal LE flexibility; Core strengthening;  Manual therapy & modalities PRN for pain; Instruction in home TENS unit per pt request   Consulted and Agree with Plan of Care Patient      Patient will benefit from skilled therapeutic intervention in order to improve the  following deficits and impairments:  Pain, Increased muscle spasms, Impaired flexibility, Decreased range of motion, Decreased strength, Postural dysfunction, Improper body mechanics, Difficulty walking, Decreased activity tolerance  Visit Diagnosis: Bilateral low back pain with right-sided sciatica, unspecified chronicity  Difficulty in walking, not elsewhere classified  Abnormal posture     Problem List Patient Active Problem List   Diagnosis Date Noted  . Hematuria 03/16/2016  . 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 Pain of upper abdomen 06/06/2015  . Neck pain 04/22/2015  . Ear pain 02/28/2015  . Abnormal urine 11/21/2014  . 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 Anemia 06/08/2014  . RLS (restless legs syndrome) 11/23/2013  . Diabetic peripheral neuropathy ([place]) 10/29/2013  . Left-sided thoracic back pain 10/06/2013  . Encounter for preventative adult health care exam with abnormal findings 09/14/2013  . Iron deficiency anemia   . Status post laparoscopy 02/25/2013  . GERD (gastroesophageal reflux disease) 01/09/2013  . Amaurosis fugax of left eye 10/16/2012  . Low back pain 06/03/2012  . Vitamin D deficiency 03/11/2012  . Bilateral hand pain 10/20/2011  . Encounter for long-term (current) use of other medications 10/20/2011  . IBS (irritable bowel syndrome) 08/14/2011  . TIA (transient ischemic attack) 02/10/2011  . Abnormal brain MRI 01/19/2011  . Allergic rhinitis 10/01/2010  . 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 FUNDIC GLAND POLYPS OF STOMACH 03/18/2010  . EPIGASTRIC PAIN chronic 03/18/2010  . ARTHRALGIA 03/17/2009  . SYSTOLIC MURMUR 25/85/2778  . Paroxysmal supraventricular tachycardia ([place]) 01/12/2009  . PLANTAR FASCIITIS 06/08/2008  . CHEST PAIN 05/18/2008  . Gastroparesis 12/18/2007  . HYPERCHOLESTEROLEMIA 06/11/2007  . Diabetes mellitus type 2 in obese ([place]) 09/05/2006  . Essential hypertension 09/05/2006    Percival Spanish,  PT, MPT 05/10/2016, 3:26 PM  [facility] [address]  [place], [place], [address] Phone: [phone]   Fax:  [phone]  Name: [name] MRN: [id number] Date of Birth: 29-Sep-1940

## 2016-05-11 ENCOUNTER — Ambulatory Visit: Payer: Medicare Other | Admitting: Physical Therapy

## 2016-05-11 DIAGNOSIS — M5441 Lumbago with sciatica, right side: Secondary | ICD-10-CM | POA: Diagnosis not present

## 2016-05-11 DIAGNOSIS — R293 Abnormal posture: Secondary | ICD-10-CM

## 2016-05-11 DIAGNOSIS — R262 Difficulty in walking, not elsewhere classified: Secondary | ICD-10-CM | POA: Diagnosis not present

## 2016-05-11 NOTE — Patient Instructions (Signed)
TENS stands for Transcutaneous Electrical Nerve Stimulation. In other words, electrical impulses are allowed to pass through the skin in order to excite a nerve.   Purpose and Use of TENS:  TENS is a method used to manage acute and chronic pain without the use of drugs. It has been effective in managing pain associated with surgery, sprains, strains, trauma, rheumatoid arthritis, and neuralgias. It is a non-addictive, low risk, and non-invasive technique used to control pain. It is not, by any means, a curative form of treatment.   How TENS Works:  Most TENS units are a small pocket-sized unit powered by one 9 volt battery. Attached to the outside of the unit are two lead wires where two pins and/or snaps connect on each wire. All units come with a set of four reusable pads or electrodes. These are placed on the skin surrounding the area involved. By inserting the leads into  the pads, the electricity can pass from the unit making the circuit complete.  As the intensity is turned up slowly, the electrical current enters the body from the electrodes through the skin to the surrounding nerve fibers. This triggers the release of hormones from within the body. These hormones contain pain relievers. By increasing the circulation of these hormones, the person's pain may be lessened. It is also believed that the electrical stimulation itself helps to block the pain messages being sent to the brain, thus also decreasing the body's perception of pain.   Hazards:  TENS units are NOT to be used by patients with PACEMAKERS, DEFIBRILLATORS, DIABETIC PUMPS, PREGNANT WOMEN, and patients with SEIZURE DISORDERS.  TENS units are NOT to be used over the heart, throat, brain, or spinal cord.  One of the major side effects from the TENS unit may be skin irritation. Some people may develop a rash if they are sensitive to the materials used in the electrodes or the connecting wires.   Wear the unit for up to 30 minutes at a  time.   Avoid overuse due the body getting used to the stem making it not as effective over time.    

## 2016-05-11 NOTE — Therapy (Signed)
[facility] [address]  [place], [place], [address] Phone: [phone]   Fax:  [phone]  Physical Therapy Treatment  Patient Details  Name: [name] MRN: [id number] Date of Birth: 1941-01-10 Referring Provider: Penni Homans, MD  Encounter Date: 05/11/2016      PT End of Session - 05/11/16 0927    Visit Number 4   Number of Visits 12   Date for PT Re-Evaluation 06/16/16   Authorization Type AARP - VL: [place]   PT Start Time 0927   PT Stop Time 1038   PT Time Calculation (min) 71 min   Activity Tolerance Patient tolerated treatment well   Behavior During Therapy [facility] for tasks assessed/performed      Past Medical History:  Diagnosis Date  . Anemia 06/08/2014  . Anxiety   . Arthritis    Spinal Osteoarthritis  . Cancer ([place])   . Carcinoid tumor of stomach   . Cataract   . Chest pain    Myoview 12/15 no ischemia.  . Chronic kidney disease    Left kidney smaller than right kidney  . Diabetes mellitus type 2 in obese ([place]) 09/05/2006   Qualifier: Diagnosis of  By: Marca Ancona RMA, Lucy    . Diabetic peripheral neuropathy ([place]) 10/29/2013  . Diverticulosis 08/30/2000   Colonoscopy   . Encounter for preventative adult health care exam with abnormal findings 09/14/2013  . Esophageal reflux   . Gastric polyp    Fundic Gland  . Gastroparesis   . Headache(784.0)   . Heart murmur    Echocardiogram 2/11: EF 60-65%, mild LAE, grade 1 diastolic dysfunction, aortic valve sclerosis, mean gradient 9 mm of mercury, PASP 34  . Hematuria 03/16/2016  . Iron deficiency anemia, unspecified   . 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 Leg swelling    bilateral  . Neck pain 04/22/2015  . PONV (postoperative nausea and vomiting)    pt states only needs small amount of anesthesia  . PSVT (paroxysmal supraventricular tachycardia) ([place])   . Pure hypercholesterolemia   . 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 Stroke ([place])    tia, 2014  . 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 Type II or unspecified type diabetes mellitus without mention of complication, not stated as uncontrolled   . Unspecified essential hypertension   . Unspecified hereditary and idiopathic peripheral neuropathy 10/29/2013    Past Surgical History:  Procedure Laterality Date  . CHOLECYSTECTOMY  1993  . COLONOSCOPY  11/11/2010   diverticulosis  . DILATATION & CURRETTAGE/HYSTEROSCOPY WITH RESECTOCOPE N/A 02/25/2013   Procedure: Attempted hysteroscopy with uterine perforation;  Surgeon: Jamey Reas de Berton Lan, MD;  Location: [facility];  Service: Gynecology;  Laterality: N/A;  . ESOPHAGOGASTRODUODENOSCOPY  08/29/2010; 09/15/2010   Carcinoid tumor less than 1 cm in July 2012 not seen in August 2012 , gastritis, fundic gland polyps  . ESOPHAGOGASTRODUODENOSCOPY  05/16/2011  . ESOPHAGOGASTRODUODENOSCOPY  06/14/2012  . EUS  12/15/2010   Procedure: UPPER ENDOSCOPIC ULTRASOUND (EUS) LINEAR;  Surgeon: Owens Loffler, MD;  Location: [facility];  Service: Endoscopy;  Laterality: N/A;  . EYE SURGERY Bilateral    Bi lateral cateracts and bi lateral laser  . LAPAROSCOPY N/A 02/25/2013   Procedure: Cystoscopy and laparoscopy with fulguration of uterine serosa;  Surgeon: Jamey Reas de Berton Lan, MD;  Location: [facility];  Service: Gynecology;  Laterality: N/A;  . TONSILLECTOMY      There were no vitals filed for this  visit.      Subjective Assessment - 05/11/16 0931    Subjective Pt reporting she felt better after yesterday's therapy session and liked the estim well enough that she brought her husband's old TENS unit with her to PT today to learn how to use it. Noting increased buttock soreness today.   Patient Stated Goals To be able travel w/o limitation d/t pain. (planning trip to [facility])   Currently in Pain? Yes   Pain Score 8    Pain Location Back  & buttock   Pain Orientation Right;Lower   Pain Descriptors / Indicators Aching                          [facility] Adult PT Treatment/Exercise - 05/11/16 0927      Self-Care   Other Self-Care Comments  Instructed pt in self-release for piriformis with ~4" ball in sitting     Lumbar Exercises: Stretches   Piriformis Stretch 30 seconds;4 reps   Piriformis Stretch Limitations 2 reps each - figure 4 & KTOS (manual after STM)     Lumbar Exercises: Aerobic   Stationary Bike NuStep - lvl 4 x 6'     Lumbar Exercises: Supine   Clam Limitations hooklying alt unilateral hip ABD/ER with red TB   Dead Bug 10 reps;3 seconds   Dead Bug Limitations + pelvic tilt     Modalities   Modalities Electrical Stimulation;Moist Heat     Moist Heat Therapy   Number Minutes Moist Heat 15 Minutes   Moist Heat Location Lumbar Spine     Electrical Stimulation   Electrical Stimulation Location R SIJ/piriformis   Electrical Stimulation Action IFC   Electrical Stimulation Parameters 80-150 Hz, intensity to pt tol x15'   Electrical Stimulation Goals Pain     Manual Therapy   Manual Therapy Soft tissue mobilization;Myofascial release   Manual therapy comments pt in L sidelying with R lower leg support on bolster   Soft tissue mobilization STM & DTM to R piriformis; STM/strumming to R ITB   Myofascial Release IASTM/ MFR to R piriformis with red med ball                PT Education - 05/11/16 1038    Education provided Yes   Education Details TENS info; self-release for R piriformis with ~4" ball   Person(s) Educated Patient   Methods Explanation;Demonstration;Handout   Comprehension Verbalized understanding          PT Short Term Goals - 05/05/16 0939      PT SHORT TERM GOAL #1   Title Independent with initial HEP by 05/19/16   Status On-going           PT Long Term Goals - 05/05/16 0939      PT LONG TERM GOAL #1   Title Independent with advanced HEP as indicated by 06/16/16   Status On-going     PT LONG TERM GOAL #2   Title Pt will demonstrate  lumbar ROM 50% of normal or greater w/o increased pain by 06/16/16   Status On-going     PT LONG TERM GOAL #3   Title Pt will report ability to perform normal ADL's and light household chores without increased low back or leg pain by 06/16/16   Status On-going     PT LONG TERM GOAL #4   Title Pt will be able to walk long enough to complete normal shopping trip w/o limitation due  to LBP or radicular pain by 06/16/16   Status On-going               Plan - 05/11/16 0935    Clinical Impression Statement Pt brought home TENS unit with her to PT today but failed to charge battery and unit not compatable with standard 9V battery therefore unable to provide instruction in use of unit today - pt to charge battery and bring unit with her to next visit. Pt reporting pain more localized to R buttock today with increased tension and ttp noted in R piriformis, which seemed to respond well to STM/MFR. Treatment concluded with estim and moist heat to same area to promote furhter muscle relaxation. Pt will continue to benefit from further skilled PT to decreased pain and muscle tightness while strengthening core and proximal LEs to reduce risk of recurrence.   Rehab Potential Good   Clinical Impairments Affecting Rehab Potential extensive PMH including chronic back and neck pain, DM, HTN, h/o TIA, peripheral neuropathy (refer to problem list & history for full PMH)   PT Treatment/Interventions Patient/family education;Neuromuscular re-education;Therapeutic exercise;Therapeutic activities;Functional mobility training;Manual techniques;Dry needling;Taping;Electrical Stimulation;Moist Heat;Cryotherapy;Traction;Ultrasound;Iontophoresis 4mg /ml Dexamethasone;ADLs/Self Care Home Management   PT Next Visit Plan Incorporate posture & body mechanics education into daily treatment sessions as indicated; Lumbar/proximal LE flexibility; Core strengthening;  Manual therapy & modalities PRN for pain; Instruction in home TENS  unit per pt request   Consulted and Agree with Plan of Care Patient      Patient will benefit from skilled therapeutic intervention in order to improve the following deficits and impairments:  Pain, Increased muscle spasms, Impaired flexibility, Decreased range of motion, Decreased strength, Postural dysfunction, Improper body mechanics, Difficulty walking, Decreased activity tolerance  Visit Diagnosis: Bilateral low back pain with right-sided sciatica, unspecified chronicity  Difficulty in walking, not elsewhere classified  Abnormal posture     Problem List Patient Active Problem List   Diagnosis Date Noted  . Hematuria 03/16/2016  . 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 Pain of upper abdomen 06/06/2015  . Neck pain 04/22/2015  . Ear pain 02/28/2015  . Abnormal urine 11/21/2014  . 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 Anemia 06/08/2014  . RLS (restless legs syndrome) 11/23/2013  . Diabetic peripheral neuropathy ([place]) 10/29/2013  . Left-sided thoracic back pain 10/06/2013  . Encounter for preventative adult health care exam with abnormal findings 09/14/2013  . Iron deficiency anemia   . Status post laparoscopy 02/25/2013  . GERD (gastroesophageal reflux disease) 01/09/2013  . Amaurosis fugax of left eye 10/16/2012  . Low back pain 06/03/2012  . Vitamin D deficiency 03/11/2012  . Bilateral hand pain 10/20/2011  . Encounter for long-term (current) use of other medications 10/20/2011  . IBS (irritable bowel syndrome) 08/14/2011  . TIA (transient ischemic attack) 02/10/2011  . Abnormal brain MRI 01/19/2011  . Allergic rhinitis 10/01/2010  . 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 FUNDIC GLAND POLYPS OF STOMACH 03/18/2010  . EPIGASTRIC PAIN chronic 03/18/2010  . ARTHRALGIA 03/17/2009  . SYSTOLIC MURMUR 30/16/0109  . Paroxysmal supraventricular tachycardia ([place]) 01/12/2009  . PLANTAR FASCIITIS 06/08/2008  . CHEST PAIN 05/18/2008  . Gastroparesis  12/18/2007  . HYPERCHOLESTEROLEMIA 06/11/2007  . Diabetes mellitus type 2 in obese ([place]) 09/05/2006  . Essential hypertension 09/05/2006    Percival Spanish, PT, MPT 05/11/2016, 10:50 AM  [facility] [place], [place], [address] Phone: [phone]  Fax:  [phone]  Name: [name] MRN: [id number] Date of Birth: [birth year]

## 2016-05-15 ENCOUNTER — Ambulatory Visit: Payer: Medicare Other | Admitting: Physical Therapy

## 2016-05-15 DIAGNOSIS — M5441 Lumbago with sciatica, right side: Secondary | ICD-10-CM

## 2016-05-15 DIAGNOSIS — R262 Difficulty in walking, not elsewhere classified: Secondary | ICD-10-CM | POA: Diagnosis not present

## 2016-05-15 DIAGNOSIS — R293 Abnormal posture: Secondary | ICD-10-CM | POA: Diagnosis not present

## 2016-05-15 NOTE — Therapy (Signed)
[facility] [address]  [place], [place], [address] Phone: [phone]   Fax:  [phone]  Physical Therapy Treatment  Patient Details  Name: [name] MRN: [id number] Date of Birth: 06/08/1940 Referring Provider: Penni Homans, MD  Encounter Date: 05/15/2016      PT End of Session - 05/15/16 1344    Visit Number 5   Number of Visits 12   Date for PT Re-Evaluation 06/16/16   Authorization Type AARP - VL: [place]   PT Start Time 1344   PT Stop Time 1454   PT Time Calculation (min) 70 min   Activity Tolerance Patient tolerated treatment well   Behavior During Therapy [facility] for tasks assessed/performed      Past Medical History:  Diagnosis Date  . Anemia 06/08/2014  . Anxiety   . Arthritis    Spinal Osteoarthritis  . Cancer ([place])   . Carcinoid tumor of stomach   . Cataract   . Chest pain    Myoview 12/15 no ischemia.  . Chronic kidney disease    Left kidney smaller than right kidney  . Diabetes mellitus type 2 in obese ([place]) 09/05/2006   Qualifier: Diagnosis of  By: Marca Ancona RMA, Lucy    . Diabetic peripheral neuropathy ([place]) 10/29/2013  . Diverticulosis 08/30/2000   Colonoscopy   . Encounter for preventative adult health care exam with abnormal findings 09/14/2013  . Esophageal reflux   . Gastric polyp    Fundic Gland  . Gastroparesis   . Headache(784.0)   . Heart murmur    Echocardiogram 2/11: EF 60-65%, mild LAE, grade 1 diastolic dysfunction, aortic valve sclerosis, mean gradient 9 mm of mercury, PASP 34  . Hematuria 03/16/2016  . Iron deficiency anemia, unspecified   . 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 Leg swelling    bilateral  . Neck pain 04/22/2015  . PONV (postoperative nausea and vomiting)    pt states only needs small amount of anesthesia  . PSVT (paroxysmal supraventricular tachycardia) ([place])   . Pure hypercholesterolemia   . 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 Stroke ([place])    tia, 2014  . 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 Type II or unspecified type diabetes mellitus without mention of complication, not stated as uncontrolled   . Unspecified essential hypertension   . Unspecified hereditary and idiopathic peripheral neuropathy 10/29/2013    Past Surgical History:  Procedure Laterality Date  . CHOLECYSTECTOMY  1993  . COLONOSCOPY  11/11/2010   diverticulosis  . DILATATION & CURRETTAGE/HYSTEROSCOPY WITH RESECTOCOPE N/A 02/25/2013   Procedure: Attempted hysteroscopy with uterine perforation;  Surgeon: Jamey Reas de Berton Lan, MD;  Location: [facility];  Service: Gynecology;  Laterality: N/A;  . ESOPHAGOGASTRODUODENOSCOPY  08/29/2010; 09/15/2010   Carcinoid tumor less than 1 cm in July 2012 not seen in August 2012 , gastritis, fundic gland polyps  . ESOPHAGOGASTRODUODENOSCOPY  05/16/2011  . ESOPHAGOGASTRODUODENOSCOPY  06/14/2012  . EUS  12/15/2010   Procedure: UPPER ENDOSCOPIC ULTRASOUND (EUS) LINEAR;  Surgeon: Owens Loffler, MD;  Location: [facility];  Service: Endoscopy;  Laterality: N/A;  . EYE SURGERY Bilateral    Bi lateral cateracts and bi lateral laser  . LAPAROSCOPY N/A 02/25/2013   Procedure: Cystoscopy and laparoscopy with fulguration of uterine serosa;  Surgeon: Jamey Reas de Berton Lan, MD;  Location: [facility];  Service: Gynecology;  Laterality: N/A;  . TONSILLECTOMY      There were no vitals filed for this  visit.      Subjective Assessment - 05/15/16 1441    Subjective Pt reports pain has been bad since Friday, hurting over her tailbone.   Patient Stated Goals To be able travel w/o limitation d/t pain. (planning trip to [facility])   Currently in Pain? Yes   Pain Score 10-Worst pain ever   Pain Location Sacrum   Pain Orientation Lower   Pain Descriptors / Indicators Sharp   Pain Type Acute pain;Chronic pain   Pain Frequency Constant   Aggravating Factors  sitting   Pain Relieving Factors nothing   Effect of Pain on Daily Activities unable to  sit comfortably or sleep on side                         [facility] Adult PT Treatment/Exercise - 05/15/16 1344      Self-Care   Self-Care Other Self-Care Comments;Heat/Ice Application   Other Self-Care Comments  Provided instruction in use of home TENS unit including electrode placement, unit adjustment and wearing schedule.     Modalities   Modalities Electrical Stimulation;Moist Heat     Moist Heat Therapy   Number Minutes Moist Heat 15 Minutes   Moist Heat Location Lumbar Spine  L sidelying with R LE supported on bolster     Electrical Stimulation   Electrical Stimulation Location B lumbosacral paraspinals & piriformii   Electrical Stimulation Action TENS   Electrical Stimulation Parameters Random modulation, intensity to pt tolerance x15'   Electrical Stimulation Goals Pain     Manual Therapy   Manual Therapy Joint mobilization;Soft tissue mobilization;Myofascial release;Muscle Energy Technique   Manual therapy comments pt in prone & L sidelying   Joint Mobilization sacral A/P mobs, gapping & compression   Soft tissue mobilization STM & DTM to B piriformis; STM/strumming to R ITB   Myofascial Release IASTM/ MFR to R piriformis with 4" ball   Muscle Energy Technique MET for apparent R posterior innominate rotation of scarum on pelvis, followed by pelvic shotgun                  PT Short Term Goals - 05/05/16 0939      PT SHORT TERM GOAL #1   Title Independent with initial HEP by 05/19/16   Status On-going           PT Long Term Goals - 05/05/16 0939      PT LONG TERM GOAL #1   Title Independent with advanced HEP as indicated by 06/16/16   Status On-going     PT LONG TERM GOAL #2   Title Pt will demonstrate lumbar ROM 50% of normal or greater w/o increased pain by 06/16/16   Status On-going     PT LONG TERM GOAL #3   Title Pt will report ability to perform normal ADL's and light household chores without increased low back or leg pain by  06/16/16   Status On-going     PT LONG TERM GOAL #4   Title Pt will be able to walk long enough to complete normal shopping trip w/o limitation due to LBP or radicular pain by 06/16/16   Status On-going               Plan - 05/15/16 1444    Clinical Impression Statement Pt arrived to therapy c/o increased pain up to 10/10 since Friday w/o known precipitating event. States pain is centralized over scarum/coccyx and makes sitting very uncomfortable. Assessment revealed  apparent R posterior innominate rotation of pelvis on sacrum, which was able to partially correct with MET. Remainder of treatement focused on manual therapy to promote improved sacral mobility and relieve increased muscle tension and pain, followed by training in use of home TENS unit. Pt reporting pain improved by end of session.   Rehab Potential Good   Clinical Impairments Affecting Rehab Potential extensive PMH including chronic back and neck pain, DM, HTN, h/o TIA, peripheral neuropathy (refer to problem list & history for full PMH)   PT Treatment/Interventions Patient/family education;Neuromuscular re-education;Therapeutic exercise;Therapeutic activities;Functional mobility training;Manual techniques;Dry needling;Taping;Electrical Stimulation;Moist Heat;Cryotherapy;Traction;Ultrasound;Iontophoresis 75m/ml Dexamethasone;ADLs/Self Care Home Management   PT Next Visit Plan Incorporate posture & body mechanics education into daily treatment sessions as indicated; Lumbar/proximal LE flexibility; Core strengthening;  Manual therapy & modalities PRN for pain; Instruction in home TENS unit per pt request   Consulted and Agree with Plan of Care Patient      Patient will benefit from skilled therapeutic intervention in order to improve the following deficits and impairments:  Pain, Increased muscle spasms, Impaired flexibility, Decreased range of motion, Decreased strength, Postural dysfunction, Improper body mechanics, Difficulty  walking, Decreased activity tolerance  Visit Diagnosis: Bilateral low back pain with right-sided sciatica, unspecified chronicity  Difficulty in walking, not elsewhere classified  Abnormal posture     Problem List Patient Active Problem List   Diagnosis Date Noted  . Hematuria 03/16/2016  . 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 Pain of upper abdomen 06/06/2015  . Neck pain 04/22/2015  . Ear pain 02/28/2015  . Abnormal urine 11/21/2014  . 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 Anemia 06/08/2014  . RLS (restless legs syndrome) 11/23/2013  . Diabetic peripheral neuropathy ([place] 10/29/2013  . Left-sided thoracic back pain 10/06/2013  . Encounter for preventative adult health care exam with abnormal findings 09/14/2013  . Iron deficiency anemia   . Status post laparoscopy 02/25/2013  . GERD (gastroesophageal reflux disease) 01/09/2013  . Amaurosis fugax of left eye 10/16/2012  . Low back pain 06/03/2012  . Vitamin D deficiency 03/11/2012  . Bilateral hand pain 10/20/2011  . Encounter for long-term (current) use of other medications 10/20/2011  . IBS (irritable bowel syndrome) 08/14/2011  . TIA (transient ischemic attack) 02/10/2011  . Abnormal brain MRI 01/19/2011  . Allergic rhinitis 10/01/2010  . 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 FUNDIC GLAND POLYPS OF STOMACH 03/18/2010  . EPIGASTRIC PAIN chronic 03/18/2010  . ARTHRALGIA 03/17/2009  . SYSTOLIC MURMUR 053/64/6803 . Paroxysmal supraventricular tachycardia ([place] 01/12/2009  . PLANTAR FASCIITIS 06/08/2008  . CHEST PAIN 05/18/2008  . Gastroparesis 12/18/2007  . HYPERCHOLESTEROLEMIA 06/11/2007  . Diabetes mellitus type 2 in obese ([place] 09/05/2006  . Essential hypertension 09/05/2006    JPercival Spanish PT, MPT 05/15/2016, 4:59 PM  [facility] [place] [address]Phone: [phone]  Fax:   [phone] Name: VDIMITRA WOODSTOCKMRN: [id number] of Birth: 223-Mar-1942

## 2016-05-17 ENCOUNTER — Ambulatory Visit: Payer: Medicare Other | Admitting: Physical Therapy

## 2016-05-17 VITALS — BP 142/78 | HR 64

## 2016-05-17 DIAGNOSIS — R293 Abnormal posture: Secondary | ICD-10-CM

## 2016-05-17 DIAGNOSIS — M5441 Lumbago with sciatica, right side: Secondary | ICD-10-CM

## 2016-05-17 DIAGNOSIS — R262 Difficulty in walking, not elsewhere classified: Secondary | ICD-10-CM

## 2016-05-17 NOTE — Therapy (Signed)
[facility] [address]  [place], [place], [address] Phone: [phone]   Fax:  [phone]  Physical Therapy Treatment  Patient Details  Name: [name] MRN: [id number] Date of Birth: 1940/11/28 Referring Provider: Penni Homans, MD  Encounter Date: 05/17/2016      PT End of Session - 05/17/16 1015    Visit Number 6   Number of Visits 12   Date for PT Re-Evaluation 06/16/16   Authorization Type AARP - VL: [place]   PT Start Time 1015   PT Stop Time 1120   PT Time Calculation (min) 65 min   Activity Tolerance Patient tolerated treatment well   Behavior During Therapy [facility] for tasks assessed/performed      Past Medical History:  Diagnosis Date  . Anemia 06/08/2014  . Anxiety   . Arthritis    Spinal Osteoarthritis  . Cancer ([place])   . Carcinoid tumor of stomach   . Cataract   . Chest pain    Myoview 12/15 no ischemia.  . Chronic kidney disease    Left kidney smaller than right kidney  . Diabetes mellitus type 2 in obese ([place]) 09/05/2006   Qualifier: Diagnosis of  By: Marca Ancona RMA, Lucy    . Diabetic peripheral neuropathy ([place]) 10/29/2013  . Diverticulosis 08/30/2000   Colonoscopy   . Encounter for preventative adult health care exam with abnormal findings 09/14/2013  . Esophageal reflux   . Gastric polyp    Fundic Gland  . Gastroparesis   . Headache(784.0)   . Heart murmur    Echocardiogram 2/11: EF 60-65%, mild LAE, grade 1 diastolic dysfunction, aortic valve sclerosis, mean gradient 9 mm of mercury, PASP 34  . Hematuria 03/16/2016  . Iron deficiency anemia, unspecified   . 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 Leg swelling    bilateral  . Neck pain 04/22/2015  . PONV (postoperative nausea and vomiting)    pt states only needs small amount of anesthesia  . PSVT (paroxysmal supraventricular tachycardia) ([place])   . Pure hypercholesterolemia   . 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 Stroke ([place])    tia, 2014  . 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 Type II or unspecified type diabetes mellitus without mention of complication, not stated as uncontrolled   . Unspecified essential hypertension   . Unspecified hereditary and idiopathic peripheral neuropathy 10/29/2013    Past Surgical History:  Procedure Laterality Date  . CHOLECYSTECTOMY  1993  . COLONOSCOPY  11/11/2010   diverticulosis  . DILATATION & CURRETTAGE/HYSTEROSCOPY WITH RESECTOCOPE N/A 02/25/2013   Procedure: Attempted hysteroscopy with uterine perforation;  Surgeon: Jamey Reas de Berton Lan, MD;  Location: [facility];  Service: Gynecology;  Laterality: N/A;  . ESOPHAGOGASTRODUODENOSCOPY  08/29/2010; 09/15/2010   Carcinoid tumor less than 1 cm in July 2012 not seen in August 2012 , gastritis, fundic gland polyps  . ESOPHAGOGASTRODUODENOSCOPY  05/16/2011  . ESOPHAGOGASTRODUODENOSCOPY  06/14/2012  . EUS  12/15/2010   Procedure: UPPER ENDOSCOPIC ULTRASOUND (EUS) LINEAR;  Surgeon: Owens Loffler, MD;  Location: [facility];  Service: Endoscopy;  Laterality: N/A;  . EYE SURGERY Bilateral    Bi lateral cateracts and bi lateral laser  . LAPAROSCOPY N/A 02/25/2013   Procedure: Cystoscopy and laparoscopy with fulguration of uterine serosa;  Surgeon: Jamey Reas de Berton Lan, MD;  Location: [facility];  Service: Gynecology;  Laterality: N/A;  . TONSILLECTOMY      Vitals:   05/17/16 1018  BP: Marland Kitchen)  142/78  Pulse: 64  SpO2: 97%        Subjective Assessment - 05/17/16 1018    Subjective Pt reports pain still bad over tailbone, but not as bad as last visit. States unable to use TENS unit at home as she could not figure out how to open it to turn it on.   Patient Stated Goals To be able travel w/o limitation d/t pain. (planning trip to [facility])   Currently in Pain? Yes   Pain Score --  8-9/10   Pain Location Sacrum   Pain Orientation Lower   Pain Descriptors / Indicators Sharp   Pain Type Acute pain;Chronic pain                          [facility] Adult PT Treatment/Exercise - 05/17/16 1015      Self-Care   Other Self-Care Comments  Reviewed instruction in use of home TENS unit, with PT verbally guiding pt in electrode placement, opening and powering on unit, intensity adjustment and wearing schedule.     Lumbar Exercises: Stretches   Pelvic Tilt Limitations 10x5"   Piriformis Stretch 30 seconds;4 reps   Piriformis Stretch Limitations 2 reps each - figure 4 & KTOS     Lumbar Exercises: Aerobic   Stationary Bike NuStep - lvl 4 x 6'     Lumbar Exercises: Supine   Clam 10 reps;3 seconds   Clam Limitations bilateral    Other Supine Lumbar Exercises B hip adduction isometric ball squeeze 10x3"     Modalities   Modalities Electrical Stimulation;Moist Heat     Moist Heat Therapy   Number Minutes Moist Heat 10 Minutes   Moist Heat Location Lumbar Spine  L sidelying with pillow btw knees     Electrical Stimulation   Electrical Stimulation Location B lumbosacral paraspinals & piriformii   Electrical Stimulation Action TENS   Electrical Stimulation Parameters Random modulation, intensity to pt tolerance x10'   Electrical Stimulation Goals Pain     Manual Therapy   Manual Therapy Soft tissue mobilization;Myofascial release;Joint mobilization   Manual therapy comments pt in prone   Joint Mobilization sacral A/P mobs, gapping & compression   Soft tissue mobilization STM & DTM to B piriformis; STM/strumming to R ITB                  PT Short Term Goals - 05/05/16 8413      PT SHORT TERM GOAL #1   Title Independent with initial HEP by 05/19/16   Status On-going           PT Long Term Goals - 05/05/16 0939      PT LONG TERM GOAL #1   Title Independent with advanced HEP as indicated by 06/16/16   Status On-going     PT LONG TERM GOAL #2   Title Pt will demonstrate lumbar ROM 50% of normal or greater w/o increased pain by 06/16/16   Status On-going     PT LONG TERM  GOAL #3   Title Pt will report ability to perform normal ADL's and light household chores without increased low back or leg pain by 06/16/16   Status On-going     PT LONG TERM GOAL #4   Title Pt will be able to walk long enough to complete normal shopping trip w/o limitation due to LBP or radicular pain by 06/16/16   Status On-going  Plan - 05/17/16 1120    Clinical Impression Statement Pt reporting pain better than last visit with less discomfort when she sits down but still rating pain at 8-9/10. Reassessment of SIJ revealed apparent neutral alignment but continued increased tension and very ttp over B piriformis, R>L, therefore continued manual therapy focus to this area. Reviewed instruction in TENS appilication and set-up with PT verbally guiding pt through electrode placement, opening and turning on unit and adjusting intensity. Increased pain intensity over past 2 visits has limited progression of exercises and postural training.   Rehab Potential Good   Clinical Impairments Affecting Rehab Potential extensive PMH including chronic back and neck pain, DM, HTN, h/o TIA, peripheral neuropathy (refer to problem list & history for full PMH)   PT Treatment/Interventions Patient/family education;Neuromuscular re-education;Therapeutic exercise;Therapeutic activities;Functional mobility training;Manual techniques;Dry needling;Taping;Electrical Stimulation;Moist Heat;Cryotherapy;Traction;Ultrasound;Iontophoresis 4mg /ml Dexamethasone;ADLs/Self Care Home Management   PT Next Visit Plan Incorporate posture & body mechanics education into daily treatment sessions as indicated; Lumbar/proximal LE flexibility; Core strengthening;  Manual therapy & modalities PRN for pain; Instruction in home TENS unit per pt request   Consulted and Agree with Plan of Care Patient      Patient will benefit from skilled therapeutic intervention in order to improve the following deficits and impairments:   Pain, Increased muscle spasms, Impaired flexibility, Decreased range of motion, Decreased strength, Postural dysfunction, Improper body mechanics, Difficulty walking, Decreased activity tolerance  Visit Diagnosis: Bilateral low back pain with right-sided sciatica, unspecified chronicity  Difficulty in walking, not elsewhere classified  Abnormal posture     Problem List Patient Active Problem List   Diagnosis Date Noted  . Hematuria 03/16/2016  . 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 Pain of upper abdomen 06/06/2015  . Neck pain 04/22/2015  . Ear pain 02/28/2015  . Abnormal urine 11/21/2014  . 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 Anemia 06/08/2014  . RLS (restless legs syndrome) 11/23/2013  . Diabetic peripheral neuropathy ([place]) 10/29/2013  . Left-sided thoracic back pain 10/06/2013  . Encounter for preventative adult health care exam with abnormal findings 09/14/2013  . Iron deficiency anemia   . Status post laparoscopy 02/25/2013  . GERD (gastroesophageal reflux disease) 01/09/2013  . Amaurosis fugax of left eye 10/16/2012  . Low back pain 06/03/2012  . Vitamin D deficiency 03/11/2012  . Bilateral hand pain 10/20/2011  . Encounter for long-term (current) use of other medications 10/20/2011  . IBS (irritable bowel syndrome) 08/14/2011  . TIA (transient ischemic attack) 02/10/2011  . Abnormal brain MRI 01/19/2011  . Allergic rhinitis 10/01/2010  . 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 FUNDIC GLAND POLYPS OF STOMACH 03/18/2010  . EPIGASTRIC PAIN chronic 03/18/2010  . ARTHRALGIA 03/17/2009  . SYSTOLIC MURMUR 84/13/2440  . Paroxysmal supraventricular tachycardia ([place]) 01/12/2009  . PLANTAR FASCIITIS 06/08/2008  . CHEST PAIN 05/18/2008  . Gastroparesis 12/18/2007  . HYPERCHOLESTEROLEMIA 06/11/2007  . Diabetes mellitus type 2 in obese ([place]) 09/05/2006  . Essential hypertension 09/05/2006    Percival Spanish, PT, MPT 05/17/2016, 12:36 PM  [facility] [address]  [place], [place], [address] Phone: [phone]   Fax:  [phone]  Name: BLAIRE PALOMINO MRN: [id number] Date of Birth: 05-21-1940

## 2016-05-21 NOTE — Progress Notes (Signed)
HPI The patient presents for followup of SVT.     Since I last saw her she has had some isolated palpitations but no sustained episodes.  She is limited by back pain and his having physical therapy for this. She's not had any presyncope or syncope. She's had no chest pressure, neck or arm discomfort. Unfortunately she limited in her activity because of the back.  She seems finally to be improving since the death of her husband a couple of years ago.     Allergies  Allergen Reactions  . Tramadol     Dizziness     Current Outpatient Prescriptions  Medication Sig Dispense Refill  . acetaminophen (TYLENOL) 500 MG tablet Take 2 tablets (1,000 mg total) by mouth every 6 (six) hours as needed for moderate pain. 30 tablet 0  . ALPRAZolam (XANAX) 0.25 MG tablet Take 1 tablet (0.25 mg total) by mouth 2 (two) times daily as needed for anxiety. 30 tablet 1  . amLODipine (NORVASC) 5 MG tablet Take 1 tablet by mouth  daily 90 tablet 3  . aspirin 81 MG tablet Take 81 mg by mouth daily.    . cholecalciferol (VITAMIN D) 1000 UNITS tablet Take 1,000 Units by mouth daily.      Marland Kitchen dicyclomine (BENTYL) 10 MG capsule Take 1 capsule (10 mg total) by mouth 4 (four) times daily as needed. 120 capsule 6  . esomeprazole (NEXIUM) 40 MG capsule Take 1 capsule (40 mg total) by mouth daily before breakfast. 30 capsule 11  . FOLBIC 2.5-25-2 MG TABS tablet TAKE 1 TABLET BY MOUTH EVERY DAY 90 tablet 3  . glucose blood test strip Check twice daily and as needed.  DX E11.9 100 each 3  . Lancets MISC Use twice daily to check blood sugar. DX E11.9 200 each 2  . metFORMIN (GLUCOPHAGE-XR) 500 MG 24 hr tablet TAKE 1 TABLET BY MOUTH 3  TIMES DAILY WITH MEALS 270 tablet 1  . metoCLOPramide (REGLAN) 5 MG tablet TAKE 1 TABLET BY MOUTH TWO  TIMES DAILY 180 tablet 1  . metoprolol (LOPRESSOR) 50 MG tablet Take 75 mg in the morning and 50 mg in the evening 225 tablet 3  . Multiple Vitamin (MULTIVITAMIN) tablet Take 1 tablet by mouth  daily.      . Multiple Vitamins-Calcium (VIACTIV MULTI-VITAMIN) CHEW Chew 2 each by mouth daily.    . ondansetron (ZOFRAN) 4 MG tablet Take 4 mg by mouth every 4 (four) hours as needed. For nausea      . potassium chloride (K-DUR,KLOR-CON) 10 MEQ tablet TAKE 1 TABLET BY MOUTH  DAILY 90 tablet 2  . rosuvastatin (CRESTOR) 10 MG tablet TAKE 1 TABLET BY MOUTH  DAILY 90 tablet 2  . tiZANidine (ZANAFLEX) 4 MG tablet   0  . valsartan (DIOVAN) 320 MG tablet Take 1 tablet by mouth  daily 90 tablet 3  . vitamin E 400 UNIT capsule Take 400 Units by mouth daily.    Marland Kitchen zoster vaccine live, PF, (ZOSTAVAX) 51884 UNT/0.65ML injection Inject 19,400 Units into the skin once. 1 each 0   Current Facility-Administered Medications  Medication Dose Route Frequency Provider Last Rate Last Dose  . 0.9 %  sodium chloride infusion  500 mL Intravenous Continuous Gatha Mayer, MD        Past Medical History:  Diagnosis Date  . Anemia 06/08/2014  . Anxiety   . Arthritis    Spinal Osteoarthritis  . Cancer ([place])   . Carcinoid tumor of  stomach   . Cataract   . Chest pain    Myoview 12/15 no ischemia.  . Chronic kidney disease    Left kidney smaller than right kidney  . Diabetes mellitus type 2 in obese ([place]) 09/05/2006   Qualifier: Diagnosis of  By: Marca Ancona RMA, Lucy    . Diabetic peripheral neuropathy ([place]) 10/29/2013  . Diverticulosis 08/30/2000   Colonoscopy   . Encounter for preventative adult health care exam with abnormal findings 09/14/2013  . Esophageal reflux   . Gastric polyp    Fundic Gland  . Gastroparesis   . Headache(784.0)   . Heart murmur    Echocardiogram 2/11: EF 60-65%, mild LAE, grade 1 diastolic dysfunction, aortic valve sclerosis, mean gradient 9 mm of mercury, PASP 34  . Hematuria 03/16/2016  . Iron deficiency anemia, unspecified   . 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 Leg swelling    bilateral  . Neck pain 04/22/2015  . PONV (postoperative nausea and vomiting)    pt states only needs small  amount of anesthesia  . PSVT (paroxysmal supraventricular tachycardia) ([place])   . Pure hypercholesterolemia   . 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 Stroke ([place])    tia, 2014  . 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 Type II or unspecified type diabetes mellitus without mention of complication, not stated as uncontrolled   . Unspecified essential hypertension   . Unspecified hereditary and idiopathic peripheral neuropathy 10/29/2013    Past Surgical History:  Procedure Laterality Date  . CHOLECYSTECTOMY  1993  . COLONOSCOPY  11/11/2010   diverticulosis  . DILATATION & CURRETTAGE/HYSTEROSCOPY WITH RESECTOCOPE N/A 02/25/2013   Procedure: Attempted hysteroscopy with uterine perforation;  Surgeon: Jamey Reas de Berton Lan, MD;  Location: [facility];  Service: Gynecology;  Laterality: N/A;  . ESOPHAGOGASTRODUODENOSCOPY  08/29/2010; 09/15/2010   Carcinoid tumor less than 1 cm in July 2012 not seen in August 2012 , gastritis, fundic gland polyps  . ESOPHAGOGASTRODUODENOSCOPY  05/16/2011  . ESOPHAGOGASTRODUODENOSCOPY  06/14/2012  . EUS  12/15/2010   Procedure: UPPER ENDOSCOPIC ULTRASOUND (EUS) LINEAR;  Surgeon: Owens Loffler, MD;  Location: [facility];  Service: Endoscopy;  Laterality: N/A;  . EYE SURGERY Bilateral    Bi lateral cateracts and bi lateral laser  . LAPAROSCOPY N/A 02/25/2013   Procedure: Cystoscopy and laparoscopy with fulguration of uterine serosa;  Surgeon: Jamey Reas de Berton Lan, MD;  Location: [facility];  Service: Gynecology;  Laterality: N/A;  . TONSILLECTOMY      ROS:   As stated in the HPI and negative for all other systems.  PHYSICAL EXAM BP (!) 172/78   Pulse 68   Ht 5\' 1"  (1.549 m)   Wt 169 lb (76.7 kg)   LMP 02/07/1992   BMI 31.93 kg/m  GENERAL:  Well appearing, and in no distress.  NECK:  No jugular venous distention, waveform within normal limits, carotid upstroke brisk and symmetric, no bruits, no thyromegaly LUNGS:  Clear to auscultation bilaterally BACK:  No CVA  tenderness CHEST:  Unremarkable HEART:  PMI not displaced or sustained,S1 and S2 within normal limits, no S3, no S4, no clicks, no rubs, slight systolic murmur peaking and heard best at the right upper sternal border, no diastolic murmurs.  Unchanged from previous exam.   ABD:  Flat, positive bowel sounds normal in frequency in pitch, no bruits, no rebound, no guarding, no midline pulsatile mass, no hepatomegaly, no splenomegaly EXT:  2 plus pulses throughout, mild bilateral ankle edema, no cyanosis no  clubbing    Lab Results  Component Value Date   HGBA1C 6.2 03/10/2016     ASSESSMENT AND PLAN  Supraventricular Tachycardia - She has some palpitations but no SVT.  No change in therapy is planned.   Hypertension -  The blood pressure is elevated in the office but she says that it is not elevated at home. No change in medications is indicated. We will continue with therapeutic lifestyle changes (TLC).  Dyslipidemia -  She was not at a target LDL and recently had the dose increased.  I will defer to Penni Homans, MD   Lab Results  Component Value Date   CHOL 203 (H) 03/10/2016   TRIG 103.0 03/10/2016   HDL 68.80 03/10/2016   LDLCALC 114 (H) 03/10/2016   LDLDIRECT 123.7 04/13/2011    Carotid stenosis - She has some mild plaque in September of 2016.  No follow up is indicated.   Murmur - This is mild aortic stenosis. We will follow this clinically.  Aortic atherosclerosis - I will pursue aggressive risk reduction.

## 2016-05-22 ENCOUNTER — Ambulatory Visit (INDEPENDENT_AMBULATORY_CARE_PROVIDER_SITE_OTHER): Payer: Medicare Other | Admitting: Cardiology

## 2016-05-22 VITALS — BP 172/78 | HR 68 | Ht 61.0 in | Wt 169.0 lb

## 2016-05-22 DIAGNOSIS — I700 Atherosclerosis of aorta: Secondary | ICD-10-CM

## 2016-05-22 DIAGNOSIS — I471 Supraventricular tachycardia: Secondary | ICD-10-CM

## 2016-05-22 DIAGNOSIS — I350 Nonrheumatic aortic (valve) stenosis: Secondary | ICD-10-CM

## 2016-05-22 NOTE — Patient Instructions (Signed)
Medication Instructions:  Continue current medications  Labwork: None Ordered  Testing/Procedures: None Ordered  Follow-Up: Your physician wants you to follow-up in: 6 Months. You will receive a reminder letter in the mail two months in advance. If you don't receive a letter, please call our office to schedule the follow-up appointment.   Any Other Special Instructions Will Be Listed Below (If Applicable).   If you need a refill on your cardiac medications before your next appointment, please call your pharmacy.   

## 2016-05-23 ENCOUNTER — Ambulatory Visit: Payer: Medicare Other | Admitting: Physical Therapy

## 2016-05-23 ENCOUNTER — Encounter: Payer: Self-pay | Admitting: Cardiology

## 2016-05-23 DIAGNOSIS — R262 Difficulty in walking, not elsewhere classified: Secondary | ICD-10-CM

## 2016-05-23 DIAGNOSIS — M5441 Lumbago with sciatica, right side: Secondary | ICD-10-CM

## 2016-05-23 DIAGNOSIS — I350 Nonrheumatic aortic (valve) stenosis: Secondary | ICD-10-CM | POA: Insufficient documentation

## 2016-05-23 DIAGNOSIS — R293 Abnormal posture: Secondary | ICD-10-CM | POA: Diagnosis not present

## 2016-05-23 DIAGNOSIS — I700 Atherosclerosis of aorta: Secondary | ICD-10-CM | POA: Insufficient documentation

## 2016-05-23 NOTE — Therapy (Signed)
[facility] [address]  [place], [place], [address] Phone: [phone]   Fax:  [phone]  Physical Therapy Treatment  Patient Details  Name: [name] MRN: [id number] Date of Birth: 06-07-1940 Referring Provider: Penni Homans, MD  Encounter Date: 05/23/2016      PT End of Session - 05/23/16 1408    Visit Number 7   Number of Visits 12   Date for PT Re-Evaluation 06/16/16   Authorization Type AARP - VL: [place]   PT Start Time 1408   PT Stop Time 1508   PT Time Calculation (min) 60 min   Activity Tolerance Patient tolerated treatment well   Behavior During Therapy [facility] for tasks assessed/performed      Past Medical History:  Diagnosis Date  . Anemia 06/08/2014  . Anxiety   . Arthritis    Spinal Osteoarthritis  . Cancer ([place])   . Carcinoid tumor of stomach   . Cataract   . Chest pain    Myoview 12/15 no ischemia.  . Chronic kidney disease    Left kidney smaller than right kidney  . Diabetes mellitus type 2 in obese ([place]) 09/05/2006   Qualifier: Diagnosis of  By: Marca Ancona RMA, Lucy    . Diabetic peripheral neuropathy ([place]) 10/29/2013  . Diverticulosis 08/30/2000   Colonoscopy   . Encounter for preventative adult health care exam with abnormal findings 09/14/2013  . Esophageal reflux   . Gastric polyp    Fundic Gland  . Gastroparesis   . Headache(784.0)   . Heart murmur    Echocardiogram 2/11: EF 60-65%, mild LAE, grade 1 diastolic dysfunction, aortic valve sclerosis, mean gradient 9 mm of mercury, PASP 34  . Hematuria 03/16/2016  . Iron deficiency anemia, unspecified   . 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 Leg swelling    bilateral  . Neck pain 04/22/2015  . PONV (postoperative nausea and vomiting)    pt states only needs small amount of anesthesia  . PSVT (paroxysmal supraventricular tachycardia) (Uncertain)   . Pure hypercholesterolemia   . 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 Stroke ([place])    tia, 2014  . 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 Type II or unspecified type diabetes mellitus without mention of complication, not stated as uncontrolled   . Unspecified essential hypertension   . Unspecified hereditary and idiopathic peripheral neuropathy 10/29/2013    Past Surgical History:  Procedure Laterality Date  . CHOLECYSTECTOMY  1993  . COLONOSCOPY  11/11/2010   diverticulosis  . DILATATION & CURRETTAGE/HYSTEROSCOPY WITH RESECTOCOPE N/A 02/25/2013   Procedure: Attempted hysteroscopy with uterine perforation;  Surgeon: Jamey Reas de Berton Lan, MD;  Location: [facility];  Service: Gynecology;  Laterality: N/A;  . ESOPHAGOGASTRODUODENOSCOPY  08/29/2010; 09/15/2010   Carcinoid tumor less than 1 cm in July 2012 not seen in August 2012 , gastritis, fundic gland polyps  . ESOPHAGOGASTRODUODENOSCOPY  05/16/2011  . ESOPHAGOGASTRODUODENOSCOPY  06/14/2012  . EUS  12/15/2010   Procedure: UPPER ENDOSCOPIC ULTRASOUND (EUS) LINEAR;  Surgeon: Owens Loffler, MD;  Location: [facility];  Service: Endoscopy;  Laterality: N/A;  . EYE SURGERY Bilateral    Bi lateral cateracts and bi lateral laser  . LAPAROSCOPY N/A 02/25/2013   Procedure: Cystoscopy and laparoscopy with fulguration of uterine serosa;  Surgeon: Jamey Reas de Berton Lan, MD;  Location: [facility];  Service: Gynecology;  Laterality: N/A;  . TONSILLECTOMY      There were no vitals filed for this  visit.      Subjective Assessment - 05/23/16 1411    Subjective Pt reports no further pain "in the middle" but still pain in buttock intermittently. States she has been able to better relieve her pain with her HEP stretches and heat/estim.   Patient Stated Goals To be able travel w/o limitation d/t pain. (planning trip to [facility])   Currently in Pain? Yes   Pain Score 7    Pain Location Buttocks   Pain Orientation Right   Pain Descriptors / Indicators Aching   Pain Type Acute pain;Chronic pain   Pain Frequency Intermittent                          [facility] Adult PT Treatment/Exercise - 05/23/16 1408      Lumbar Exercises: Aerobic   Stationary Bike NuStep - lvl 5 x 6'     Lumbar Exercises: Supine   Ab Set 10 reps;5 seconds   Clam 15 reps;3 seconds   Clam Limitations TrA + alt hip ABD/ER with red TB     Lumbar Exercises: Sidelying   Clam 10 reps;3 seconds  bilateral    Clam Limitations red TB     Knee/Hip Exercises: Standing   Hip Flexion Both;10 reps;Knee bent   Hip Abduction Both;10 reps;Knee straight   Hip Extension Both;10 reps;Knee straight     Modalities   Modalities Electrical Stimulation;Moist Heat     Moist Heat Therapy   Number Minutes Moist Heat 15 Minutes   Moist Heat Location Lumbar Spine  R buttock - L sidelying with R LE supported on bolster     Acupuncturist Stimulation Location R piriformis   Electrical Stimulation Action IFC   Electrical Stimulation Parameters 80-150 Hz, intensity to pt tol x15'   Electrical Stimulation Goals Pain     Manual Therapy   Manual Therapy Soft tissue mobilization;Myofascial release   Manual therapy comments pt in L sidelying with R lower leg support on bolster   Soft tissue mobilization STM & DTM to B piriformis; STM/strumming to R ITB   Myofascial Release IASTM/ MFR to R piriformis with 4" ball                  PT Short Term Goals - 05/05/16 0939      PT SHORT TERM GOAL #1   Title Independent with initial HEP by 05/19/16   Status On-going           PT Long Term Goals - 05/05/16 0939      PT LONG TERM GOAL #1   Title Independent with advanced HEP as indicated by 06/16/16   Status On-going     PT LONG TERM GOAL #2   Title Pt will demonstrate lumbar ROM 50% of normal or greater w/o increased pain by 06/16/16   Status On-going     PT LONG TERM GOAL #3   Title Pt will report ability to perform normal ADL's and light household chores without increased low back or leg pain by 06/16/16   Status  On-going     PT LONG TERM GOAL #4   Title Pt will be able to walk long enough to complete normal shopping trip w/o limitation due to LBP or radicular pain by 06/16/16   Status On-going               Plan - 05/23/16 1508    Clinical Impression Statement Pt reporting pain  lessening with sacral pain resolved and pain not more localized to R piriformis. Able to get some relief with self-release techniques with small ball, stretches, moist heat & TENS at home; but continued increased muscle tension noted here with some limited tolerance for R hip ER and/or ABD strengthening, although pt was able to tolerate progression to standing hip strengthening.   Rehab Potential Good   Clinical Impairments Affecting Rehab Potential extensive PMH including chronic back and neck pain, DM, HTN, h/o TIA, peripheral neuropathy (refer to problem list & history for full PMH)   PT Treatment/Interventions Patient/family education;Neuromuscular re-education;Therapeutic exercise;Therapeutic activities;Functional mobility training;Manual techniques;Dry needling;Taping;Electrical Stimulation;Moist Heat;Cryotherapy;Traction;Ultrasound;Iontophoresis 4mg /ml Dexamethasone;ADLs/Self Care Home Management   PT Next Visit Plan Incorporate posture & body mechanics education into daily treatment sessions as indicated; Lumbar/proximal LE flexibility; Core strengthening;  Manual therapy & modalities PRN for pain; Instruction in home TENS unit per pt request   Consulted and Agree with Plan of Care Patient      Patient will benefit from skilled therapeutic intervention in order to improve the following deficits and impairments:  Pain, Increased muscle spasms, Impaired flexibility, Decreased range of motion, Decreased strength, Postural dysfunction, Improper body mechanics, Difficulty walking, Decreased activity tolerance  Visit Diagnosis: Bilateral low back pain with right-sided sciatica, unspecified chronicity  Difficulty in  walking, not elsewhere classified  Abnormal posture     Problem List Patient Active Problem List   Diagnosis Date Noted  . Nonrheumatic aortic valve stenosis 05/23/2016  . Aortic atherosclerosis ([place]) 05/23/2016  . Hematuria 03/16/2016  . 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 Pain of upper abdomen 06/06/2015  . Neck pain 04/22/2015  . Ear pain 02/28/2015  . Abnormal urine 11/21/2014  . 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 Anemia 06/08/2014  . RLS (restless legs syndrome) 11/23/2013  . Diabetic peripheral neuropathy ([place]) 10/29/2013  . Left-sided thoracic back pain 10/06/2013  . Encounter for preventative adult health care exam with abnormal findings 09/14/2013  . Iron deficiency anemia   . Status post laparoscopy 02/25/2013  . GERD (gastroesophageal reflux disease) 01/09/2013  . Amaurosis fugax of left eye 10/16/2012  . Low back pain 06/03/2012  . Vitamin D deficiency 03/11/2012  . Bilateral hand pain 10/20/2011  . Encounter for long-term (current) use of other medications 10/20/2011  . IBS (irritable bowel syndrome) 08/14/2011  . TIA (transient ischemic attack) 02/10/2011  . Abnormal brain MRI 01/19/2011  . Allergic rhinitis 10/01/2010  . 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 FUNDIC GLAND POLYPS OF STOMACH 03/18/2010  . EPIGASTRIC PAIN chronic 03/18/2010  . ARTHRALGIA 03/17/2009  . SYSTOLIC MURMUR 73/53/2992  . Paroxysmal supraventricular tachycardia ([place]) 01/12/2009  . PLANTAR FASCIITIS 06/08/2008  . CHEST PAIN 05/18/2008  . Gastroparesis 12/18/2007  . HYPERCHOLESTEROLEMIA 06/11/2007  . Diabetes mellitus type 2 in obese ([place]) 09/05/2006  . Essential hypertension 09/05/2006    Percival Spanish, PT, MPT 05/23/2016, 4:46 PM  [facility] [address]  [place], [place], [address] Phone: [phone]   Fax:  [phone]  Name: [name] MRN: [id number] Date of  Birth: [birth year]

## 2016-05-25 ENCOUNTER — Ambulatory Visit: Payer: Medicare Other | Admitting: Physical Therapy

## 2016-05-25 DIAGNOSIS — R262 Difficulty in walking, not elsewhere classified: Secondary | ICD-10-CM | POA: Diagnosis not present

## 2016-05-25 DIAGNOSIS — R293 Abnormal posture: Secondary | ICD-10-CM

## 2016-05-25 DIAGNOSIS — M5441 Lumbago with sciatica, right side: Secondary | ICD-10-CM

## 2016-05-25 NOTE — Therapy (Signed)
[facility] [address]  [place], [place], [address] Phone: [phone]   Fax:  [phone]  Physical Therapy Treatment  Patient Details  Name: [name] MRN: [id number] Date of Birth: 1940/12/25 Referring Provider: Penni Homans, MD  Encounter Date: 05/25/2016      PT End of Session - 05/25/16 1315    Visit Number 8   Number of Visits 12   Date for PT Re-Evaluation 06/16/16   Authorization Type AARP - VL: [place]   PT Start Time 1315   PT Stop Time 1409   PT Time Calculation (min) 54 min   Activity Tolerance Patient tolerated treatment well   Behavior During Therapy [facility] for tasks assessed/performed      Past Medical History:  Diagnosis Date  . Anemia 06/08/2014  . Anxiety   . Arthritis    Spinal Osteoarthritis  . Cancer ([place])   . Carcinoid tumor of stomach   . Cataract   . Chest pain    Myoview 12/15 no ischemia.  . Chronic kidney disease    Left kidney smaller than right kidney  . Diabetes mellitus type 2 in obese ([place]) 09/05/2006   Qualifier: Diagnosis of  By: Marca Ancona RMA, Lucy    . Diabetic peripheral neuropathy ([place]) 10/29/2013  . Diverticulosis 08/30/2000   Colonoscopy   . Encounter for preventative adult health care exam with abnormal findings 09/14/2013  . Esophageal reflux   . Gastric polyp    Fundic Gland  . Gastroparesis   . Headache(784.0)   . Heart murmur    Echocardiogram 2/11: EF 60-65%, mild LAE, grade 1 diastolic dysfunction, aortic valve sclerosis, mean gradient 9 mm of mercury, PASP 34  . Hematuria 03/16/2016  . Iron deficiency anemia, unspecified   . 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 Leg swelling    bilateral  . Neck pain 04/22/2015  . PONV (postoperative nausea and vomiting)    pt states only needs small amount of anesthesia  . PSVT (paroxysmal supraventricular tachycardia) ([place])   . Pure hypercholesterolemia   . 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 Stroke ([place])    tia, 2014  . 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 Type II or unspecified type diabetes mellitus without mention of complication, not stated as uncontrolled   . Unspecified essential hypertension   . Unspecified hereditary and idiopathic peripheral neuropathy 10/29/2013    Past Surgical History:  Procedure Laterality Date  . CHOLECYSTECTOMY  1993  . COLONOSCOPY  11/11/2010   diverticulosis  . DILATATION & CURRETTAGE/HYSTEROSCOPY WITH RESECTOCOPE N/A 02/25/2013   Procedure: Attempted hysteroscopy with uterine perforation;  Surgeon: Jamey Reas de Berton Lan, MD;  Location: [facility];  Service: Gynecology;  Laterality: N/A;  . ESOPHAGOGASTRODUODENOSCOPY  08/29/2010; 09/15/2010   Carcinoid tumor less than 1 cm in July 2012 not seen in August 2012 , gastritis, fundic gland polyps  . ESOPHAGOGASTRODUODENOSCOPY  05/16/2011  . ESOPHAGOGASTRODUODENOSCOPY  06/14/2012  . EUS  12/15/2010   Procedure: UPPER ENDOSCOPIC ULTRASOUND (EUS) LINEAR;  Surgeon: Owens Loffler, MD;  Location: [facility];  Service: Endoscopy;  Laterality: N/A;  . EYE SURGERY Bilateral    Bi lateral cateracts and bi lateral laser  . LAPAROSCOPY N/A 02/25/2013   Procedure: Cystoscopy and laparoscopy with fulguration of uterine serosa;  Surgeon: Jamey Reas de Berton Lan, MD;  Location: [facility];  Service: Gynecology;  Laterality: N/A;  . TONSILLECTOMY      There were no vitals filed for this  visit.      Subjective Assessment - 05/25/16 1323    Subjective Pt states she is "feeling good today" and was better yesterday as well.   Patient Stated Goals To be able travel w/o limitation d/t pain. (planning trip to [facility])   Currently in Pain? Yes   Pain Score 5    Pain Location Buttocks   Pain Orientation Right   Pain Descriptors / Indicators Aching            [facility] PT Assessment - 05/25/16 1315      Assessment   Medical Diagnosis Low back pain + R sciatica                     [facility] Adult PT Treatment/Exercise  - 05/25/16 1315      Lumbar Exercises: Aerobic   Stationary Bike NuStep - lvl 5 x 7'     Lumbar Exercises: Standing   Heel Raises 10 reps   Heel Raises Limitations counter for support   Functional Squats 15 reps;3 seconds   Functional Squats Limitations counter squat   Row Both;15 reps;Theraband   Theraband Level (Row) Level 2 (Red)   Other Standing Lumbar Exercises B pallof press with red TB 10x3"     Knee/Hip Exercises: Standing   Hip Flexion Both;15 reps;Knee bent   Hip Flexion Limitations 1#, counter for support   Hip Abduction Both;15 reps;Knee straight   Abduction Limitations 1#, counter for support   Hip Extension Both;15 reps   Extension Limitations 1#, counter for support     Modalities   Modalities --  pt declined need for modalities today     Manual Therapy   Manual Therapy Soft tissue mobilization   Manual therapy comments pt sidelying   Soft tissue mobilization STM & DTM to B piriformis; STM/strumming to B ITB                PT Education - 05/25/16 1405    Education provided Yes   Education Details HEP update - standing hip & core strengthening   Person(s) Educated Patient   Methods Explanation;Demonstration;Handout   Comprehension Verbalized understanding;Returned demonstration;Need further instruction          PT Short Term Goals - 05/25/16 1405      PT SHORT TERM GOAL #1   Title Independent with initial HEP by 05/19/16   Status Achieved           PT Long Term Goals - 05/05/16 0939      PT LONG TERM GOAL #1   Title Independent with advanced HEP as indicated by 06/16/16   Status On-going     PT LONG TERM GOAL #2   Title Pt will demonstrate lumbar ROM 50% of normal or greater w/o increased pain by 06/16/16   Status On-going     PT LONG TERM GOAL #3   Title Pt will report ability to perform normal ADL's and light household chores without increased low back or leg pain by 06/16/16   Status On-going     PT LONG TERM GOAL #4   Title  Pt will be able to walk long enough to complete normal shopping trip w/o limitation due to LBP or radicular pain by 06/16/16   Status On-going               Plan - 05/25/16 1414    Clinical Impression Statement Pt noting benefit from PT with progressively lessening pain over last few visits  with pt reporting 2 good days in a row since last PT visit. Decreased pain allowing for shift from manual & modality focus to strengthening program. Pt continues to note some pain/tightness in B piriformis although intensity much less than a few visits ago and notes improvement in LE radiculopathy as well. HEP updated to include standing hip and core strengthening exercises.   Rehab Potential Good   Clinical Impairments Affecting Rehab Potential extensive PMH including chronic back and neck pain, DM, HTN, h/o TIA, peripheral neuropathy (refer to problem list & history for full PMH)   PT Treatment/Interventions Patient/family education;Neuromuscular re-education;Therapeutic exercise;Therapeutic activities;Functional mobility training;Manual techniques;Dry needling;Taping;Electrical Stimulation;Moist Heat;Cryotherapy;Traction;Ultrasound;Iontophoresis 4mg /ml Dexamethasone;ADLs/Self Care Home Management   PT Next Visit Plan Incorporate posture & body mechanics education into daily treatment sessions as indicated; Lumbar/proximal LE flexibility; Core strengthening;  Manual therapy & modalities PRN for pain; Instruction in home TENS unit per pt request   Consulted and Agree with Plan of Care Patient      Patient will benefit from skilled therapeutic intervention in order to improve the following deficits and impairments:  Pain, Increased muscle spasms, Impaired flexibility, Decreased range of motion, Decreased strength, Postural dysfunction, Improper body mechanics, Difficulty walking, Decreased activity tolerance  Visit Diagnosis: Bilateral low back pain with right-sided sciatica, unspecified  chronicity  Difficulty in walking, not elsewhere classified  Abnormal posture     Problem List Patient Active Problem List   Diagnosis Date Noted  . Nonrheumatic aortic valve stenosis 05/23/2016  . Aortic atherosclerosis ([place]) 05/23/2016  . Hematuria 03/16/2016  . 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 Pain of upper abdomen 06/06/2015  . Neck pain 04/22/2015  . Ear pain 02/28/2015  . Abnormal urine 11/21/2014  . 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 Anemia 06/08/2014  . RLS (restless legs syndrome) 11/23/2013  . Diabetic peripheral neuropathy ([place]) 10/29/2013  . Left-sided thoracic back pain 10/06/2013  . Encounter for preventative adult health care exam with abnormal findings 09/14/2013  . Iron deficiency anemia   . Status post laparoscopy 02/25/2013  . GERD (gastroesophageal reflux disease) 01/09/2013  . Amaurosis fugax of left eye 10/16/2012  . Low back pain 06/03/2012  . Vitamin D deficiency 03/11/2012  . Bilateral hand pain 10/20/2011  . Encounter for long-term (current) use of other medications 10/20/2011  . IBS (irritable bowel syndrome) 08/14/2011  . TIA (transient ischemic attack) 02/10/2011  . Abnormal brain MRI 01/19/2011  . Allergic rhinitis 10/01/2010  . 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 FUNDIC GLAND POLYPS OF STOMACH 03/18/2010  . EPIGASTRIC PAIN chronic 03/18/2010  . ARTHRALGIA 03/17/2009  . SYSTOLIC MURMUR 24/82/5003  . Paroxysmal supraventricular tachycardia ([place]) 01/12/2009  . PLANTAR FASCIITIS 06/08/2008  . CHEST PAIN 05/18/2008  . Gastroparesis 12/18/2007  . HYPERCHOLESTEROLEMIA 06/11/2007  . Diabetes mellitus type 2 in obese ([place]) 09/05/2006  . Essential hypertension 09/05/2006    Percival Spanish, PT, MPT 05/25/2016, 2:25 PM  [facility] [address]  [place], [place], [address] Phone: [phone]   Fax:  [phone]  Name: JAYDON SOROKA MRN: [id number] Date of Birth: 1940/12/04

## 2016-05-29 ENCOUNTER — Encounter: Payer: Self-pay | Admitting: Family Medicine

## 2016-05-29 ENCOUNTER — Ambulatory Visit (INDEPENDENT_AMBULATORY_CARE_PROVIDER_SITE_OTHER): Payer: Medicare Other | Admitting: Family Medicine

## 2016-05-29 DIAGNOSIS — M5441 Lumbago with sciatica, right side: Secondary | ICD-10-CM

## 2016-05-29 NOTE — Assessment & Plan Note (Signed)
Much improved with physical therapy, home exercises.  No longer taking medications except rare tylenol.  Elizebeth Koller out last couple visits of therapy, continue home exercises at least 6 more weeks.  Tylenol if needed.  f/u prn.

## 2016-05-29 NOTE — Patient Instructions (Signed)
Finish the next couple visits of therapy. Do home exercises 3 times a week at least though it is a good idea to do them daily. I'd wait another 6 weeks before taking the trip to [place]. Call me if you have any problems otherwise follow up as needed.

## 2016-05-29 NOTE — Progress Notes (Signed)
PCP and consultation requested by: Penni Homans, MD  Subjective:   HPI: Patient is a 76 y.o. female here for low back pain.  3/22: Patient denies known injury or trauma. She reports about 2 weeks ago she started to develop pain in low back radiating down right leg to the foot. Cannot sleep on right side. Associated numbness in same distribution. Pain level 8/10 and sharp. Worse in the morning. Tried aspercreme, heating pad. Toradol injection helped, zanaflex made her too sleepy. No skin changes. No bowel/bladder dysfunction.  4/23: Patient reports she's doing well. Pain level is down to 2/10 at most, dull right side. Has a couple more visits of PT left. Is doing home exercises. Rarely taking tylenol. Does use heating pad. Using a home stim unit as well. No skin changes, numbness. No bowel/bladder dysfunction.  Past Medical History:  Diagnosis Date  . Anemia 06/08/2014  . Anxiety   . Arthritis    Spinal Osteoarthritis  . Cancer ([place])   . Carcinoid tumor of stomach   . Cataract   . Chest pain    Myoview 12/15 no ischemia.  . Chronic kidney disease    Left kidney smaller than right kidney  . Diabetes mellitus type 2 in obese ([place]) 09/05/2006   Qualifier: Diagnosis of  By: Marca Ancona RMA, Lucy    . Diabetic peripheral neuropathy ([place]) 10/29/2013  . Diverticulosis 08/30/2000   Colonoscopy   . Encounter for preventative adult health care exam with abnormal findings 09/14/2013  . Esophageal reflux   . Gastric polyp    Fundic Gland  . Gastroparesis   . Headache(784.0)   . Heart murmur    Echocardiogram 2/11: EF 60-65%, mild LAE, grade 1 diastolic dysfunction, aortic valve sclerosis, mean gradient 9 mm of mercury, PASP 34  . Hematuria 03/16/2016  . Iron deficiency anemia, unspecified   . 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 Leg swelling    bilateral  . Neck pain 04/22/2015  . PONV (postoperative nausea and vomiting)    pt states only needs small amount of anesthesia  . PSVT  (paroxysmal supraventricular tachycardia) ([place])   . Pure hypercholesterolemia   . 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 Stroke ([place])    tia, 2014  . 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 Type II or unspecified type diabetes mellitus without mention of complication, not stated as uncontrolled   . Unspecified essential hypertension   . Unspecified hereditary and idiopathic peripheral neuropathy 10/29/2013    Current Outpatient Prescriptions on File Prior to Visit  Medication Sig Dispense Refill  . acetaminophen (TYLENOL) 500 MG tablet Take 2 tablets (1,000 mg total) by mouth every 6 (six) hours as needed for moderate pain. 30 tablet 0  . ALPRAZolam (XANAX) 0.25 MG tablet Take 1 tablet (0.25 mg total) by mouth 2 (two) times daily as needed for anxiety. 30 tablet 1  . amLODipine (NORVASC) 5 MG tablet Take 1 tablet by mouth  daily 90 tablet 3  . aspirin 81 MG tablet Take 81 mg by mouth daily.    . cholecalciferol (VITAMIN D) 1000 UNITS tablet Take 1,000 Units by mouth daily.      Marland Kitchen dicyclomine (BENTYL) 10 MG capsule Take 1 capsule (10 mg total) by mouth 4 (four) times daily as needed. 120 capsule 6  . esomeprazole (NEXIUM) 40 MG capsule Take 1 capsule (40 mg total) by mouth daily before breakfast. 30 capsule 11  . FOLBIC 2.5-25-2 MG TABS tablet TAKE 1 TABLET BY MOUTH EVERY DAY 90 tablet 3  .  glucose blood test strip Check twice daily and as needed.  DX E11.9 100 each 3  . Lancets MISC Use twice daily to check blood sugar. DX E11.9 200 each 2  . metFORMIN (GLUCOPHAGE-XR) 500 MG 24 hr tablet TAKE 1 TABLET BY MOUTH 3  TIMES DAILY WITH MEALS 270 tablet 1  . metoCLOPramide (REGLAN) 5 MG tablet TAKE 1 TABLET BY MOUTH TWO  TIMES DAILY 180 tablet 1  . metoprolol (LOPRESSOR) 50 MG tablet Take 75 mg in the morning and 50 mg in the evening 225 tablet 3  . Multiple Vitamin (MULTIVITAMIN) tablet Take 1 tablet by mouth daily.      . Multiple Vitamins-Calcium (VIACTIV MULTI-VITAMIN) CHEW Chew 2 each by mouth daily.    . ondansetron  (ZOFRAN) 4 MG tablet Take 4 mg by mouth every 4 (four) hours as needed. For nausea      . potassium chloride (K-DUR,KLOR-CON) 10 MEQ tablet TAKE 1 TABLET BY MOUTH  DAILY 90 tablet 2  . rosuvastatin (CRESTOR) 10 MG tablet TAKE 1 TABLET BY MOUTH  DAILY 90 tablet 2  . tiZANidine (ZANAFLEX) 4 MG tablet   0  . valsartan (DIOVAN) 320 MG tablet Take 1 tablet by mouth  daily 90 tablet 3  . vitamin E 400 UNIT capsule Take 400 Units by mouth daily.    Marland Kitchen zoster vaccine live, PF, (ZOSTAVAX) 93570 UNT/0.65ML injection Inject 19,400 Units into the skin once. 1 each 0   Current Facility-Administered Medications on File Prior to Visit  Medication Dose Route Frequency Provider Last Rate Last Dose  . 0.9 %  sodium chloride infusion  500 mL Intravenous Continuous Gatha Mayer, MD        Past Surgical History:  Procedure Laterality Date  . CHOLECYSTECTOMY  1993  . COLONOSCOPY  11/11/2010   diverticulosis  . DILATATION & CURRETTAGE/HYSTEROSCOPY WITH RESECTOCOPE N/A 02/25/2013   Procedure: Attempted hysteroscopy with uterine perforation;  Surgeon: Jamey Reas de Berton Lan, MD;  Location: [facility];  Service: Gynecology;  Laterality: N/A;  . ESOPHAGOGASTRODUODENOSCOPY  08/29/2010; 09/15/2010   Carcinoid tumor less than 1 cm in July 2012 not seen in August 2012 , gastritis, fundic gland polyps  . ESOPHAGOGASTRODUODENOSCOPY  05/16/2011  . ESOPHAGOGASTRODUODENOSCOPY  06/14/2012  . EUS  12/15/2010   Procedure: UPPER ENDOSCOPIC ULTRASOUND (EUS) LINEAR;  Surgeon: Owens Loffler, MD;  Location: [facility];  Service: Endoscopy;  Laterality: N/A;  . EYE SURGERY Bilateral    Bi lateral cateracts and bi lateral laser  . LAPAROSCOPY N/A 02/25/2013   Procedure: Cystoscopy and laparoscopy with fulguration of uterine serosa;  Surgeon: Jamey Reas de Berton Lan, MD;  Location: [facility];  Service: Gynecology;  Laterality: N/A;  . TONSILLECTOMY      Allergies  Allergen Reactions  . Tramadol     Dizziness      Social History   Social History  . Marital status: Married    Spouse name: N/A  . Number of children: 0  . Years of education: college   Occupational History  . Retail    Social History Main Topics  . Smoking status: Never Smoker  . Smokeless tobacco: Never Used     Comment: Never used tobacco  . Alcohol use No  . Drug use: No  . Sexual activity: No     Comment: lives alone, no dietary restrictions except avoid fresh veg, fruit, whole grains   Other Topics Concern  . Not on file   Social History  Narrative   Patient was married ([name]) - widow   Patient does not have any children.   Patient is right-handed.   Patient has a BA degree.   One caffeine drink daily     Family History  Problem Relation Age of Onset  . Diabetes Mother   . Stroke Father     deceased age 32  . Heart disease Sister     deceased MI age 60  . Heart disease Brother     deceased MI age 48  . Diabetes Maternal Grandmother   . Hypertension Paternal Grandmother   . Diabetes Sister   . Heart disease Sister   . Hypertension Sister   . Hyperlipidemia Sister   . Diabetes Sister   . Heart disease Sister   . Hypertension Sister   . Hyperlipidemia Sister   . Diabetes Brother   . Heart disease Brother   . Hypertension Brother   . Hyperlipidemia Brother   . Colon cancer Neg Hx   . Esophageal cancer Neg Hx   . Stomach cancer Neg Hx   . Rectal cancer Neg Hx     BP 140/76   Ht 5\' 1"  (1.549 m)   Wt 170 lb (77.1 kg)   LMP 02/07/1992   BMI 32.12 kg/m   Review of Systems: See HPI above.     Objective:  Physical Exam:  Gen: NAD, comfortable in exam room  Back: No gross deformity, scoliosis. TTP mildly right lumbar paraspinal region.  No midline or bony TTP. FROM. Strength LEs 5/5 all muscle groups.   2+ MSRs in patellar and achilles tendons, equal bilaterally. Negative SLRs. Sensation intact to light touch bilaterally. Negative logroll bilateral hips   Assessment & Plan:  1.  Lumbar radiculopathy - Much improved with physical therapy, home exercises.  No longer taking medications except rare tylenol.  Elizebeth Koller out last couple visits of therapy, continue home exercises at least 6 more weeks.  Tylenol if needed.  f/u prn.

## 2016-05-30 ENCOUNTER — Ambulatory Visit: Payer: Medicare Other | Admitting: Physical Therapy

## 2016-05-30 DIAGNOSIS — R262 Difficulty in walking, not elsewhere classified: Secondary | ICD-10-CM

## 2016-05-30 DIAGNOSIS — R293 Abnormal posture: Secondary | ICD-10-CM | POA: Diagnosis not present

## 2016-05-30 DIAGNOSIS — M5441 Lumbago with sciatica, right side: Secondary | ICD-10-CM

## 2016-05-31 NOTE — Therapy (Signed)
[facility] [address]  [place], [place], [address] Phone: [phone]   Fax:  [phone]  Physical Therapy Treatment  Patient Details  Name: [name] MRN: [id number] Date of Birth: [birth year] Referring Provider: Penni Homans, MD  Encounter Date: 05/30/2016      PT End of Session - 05/30/16 1354    Visit Number 9   Number of Visits 12   Date for PT Re-Evaluation 06/16/16   Authorization Type AARP - VL: [place]   PT Start Time 8099   PT Stop Time 1510   PT Time Calculation (min) 76 min   Activity Tolerance Patient tolerated treatment well   Behavior During Therapy [facility] for tasks assessed/performed      Past Medical History:  Diagnosis Date  . Anemia 06/08/2014  . Anxiety   . Arthritis    Spinal Osteoarthritis  . Cancer ([place])   . Carcinoid tumor of stomach   . Cataract   . Chest pain    Myoview 12/15 no ischemia.  . Chronic kidney disease    Left kidney smaller than right kidney  . Diabetes mellitus type 2 in obese ([place]) 09/05/2006   Qualifier: Diagnosis of  By: Marca Ancona RMA, Lucy    . Diabetic peripheral neuropathy ([place]) 10/29/2013  . Diverticulosis 08/30/2000   Colonoscopy   . Encounter for preventative adult health care exam with abnormal findings 09/14/2013  . Esophageal reflux   . Gastric polyp    Fundic Gland  . Gastroparesis   . Headache(784.0)   . Heart murmur    Echocardiogram 2/11: EF 60-65%, mild LAE, grade 1 diastolic dysfunction, aortic valve sclerosis, mean gradient 9 mm of mercury, PASP 34  . Hematuria 03/16/2016  . Iron deficiency anemia, unspecified   . 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 Leg swelling    bilateral  . Neck pain 04/22/2015  . PONV (postoperative nausea and vomiting)    pt states only needs small amount of anesthesia  . PSVT (paroxysmal supraventricular tachycardia) ([place])   . Pure hypercholesterolemia   . 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 Stroke ([place])    tia, 2014  . 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 Type II or unspecified type diabetes mellitus without mention of complication, not stated as uncontrolled   . Unspecified essential hypertension   . Unspecified hereditary and idiopathic peripheral neuropathy 10/29/2013    Past Surgical History:  Procedure Laterality Date  . CHOLECYSTECTOMY  1993  . COLONOSCOPY  11/11/2010   diverticulosis  . DILATATION & CURRETTAGE/HYSTEROSCOPY WITH RESECTOCOPE N/A 02/25/2013   Procedure: Attempted hysteroscopy with uterine perforation;  Surgeon: Jamey Reas de Berton Lan, MD;  Location: [facility];  Service: Gynecology;  Laterality: N/A;  . ESOPHAGOGASTRODUODENOSCOPY  08/29/2010; 09/15/2010   Carcinoid tumor less than 1 cm in July 2012 not seen in August 2012 , gastritis, fundic gland polyps  . ESOPHAGOGASTRODUODENOSCOPY  05/16/2011  . ESOPHAGOGASTRODUODENOSCOPY  06/14/2012  . EUS  12/15/2010   Procedure: UPPER ENDOSCOPIC ULTRASOUND (EUS) LINEAR;  Surgeon: Owens Loffler, MD;  Location: [facility];  Service: Endoscopy;  Laterality: N/A;  . EYE SURGERY Bilateral    Bi lateral cateracts and bi lateral laser  . LAPAROSCOPY N/A 02/25/2013   Procedure: Cystoscopy and laparoscopy with fulguration of uterine serosa;  Surgeon: Jamey Reas de Berton Lan, MD;  Location: [facility];  Service: Gynecology;  Laterality: N/A;  . TONSILLECTOMY      There were no vitals filed for this  visit.      Subjective Assessment - 05/30/16 1358    Subjective Pt states she was without pain yesterday, but noting mild pain in R lower back/upper buttock hip today.   Patient Stated Goals To be able travel w/o limitation d/t pain. (planning trip to [facility])   Currently in Pain? Yes   Pain Score 2    Pain Location Back   Pain Orientation Right;Lower   Pain Descriptors / Indicators Aching                         [facility] Adult PT Treatment/Exercise - 05/30/16 1354      Lumbar Exercises: Stretches   Single Knee to Chest  Stretch 30 seconds;2 reps   Single Knee to Chest Stretch Limitations pt noting better stretch with KTOS   Piriformis Stretch 30 seconds;4 reps   Piriformis Stretch Limitations 2 reps each - figure 4 & KTOS     Lumbar Exercises: Aerobic   Stationary Bike NuStep - lvl 5 x 6'     Lumbar Exercises: Standing   Functional Squats 15 reps;3 seconds   Functional Squats Limitations counter squat   Row Both;10 reps;Theraband  2 sets   Theraband Level (Row) Level 2 (Red)   Row Limitations staggered stance   Shoulder Extension Both;10 reps;Theraband   Theraband Level (Shoulder Extension) Level 2 (Red)   Shoulder Extension Limitations staggered stance   Other Standing Lumbar Exercises B pallof press with red TB 10x3"     Lumbar Exercises: Supine   Clam 15 reps;3 seconds   Clam Limitations TrA + alt hip ABD/ER with blue TB (blue utilized as this is what pt reported using at home)   Bridge 10 reps;3 seconds   Bridge Limitations +hip abduction isometric with blue TB     Lumbar Exercises: Sidelying   Clam 10 reps;3 seconds  bilateral    Clam Limitations green TB     Knee/Hip Exercises: Standing   Hip Flexion Both;15 reps;Knee straight;Stengthening   Hip Flexion Limitations looped red TB   Hip Abduction Both;15 reps;Knee straight;Stengthening   Abduction Limitations looped red TB   Hip Extension Both;15 reps;Knee straight;Stengthening   Extension Limitations looped red TB     Modalities   Modalities Electrical Stimulation;Moist Heat     Moist Heat Therapy   Number Minutes Moist Heat 15 Minutes   Moist Heat Location Lumbar Spine     Electrical Stimulation   Electrical Stimulation Location R piriformis   Electrical Stimulation Action IFC   Electrical Stimulation Parameters 80-150 Hz, intensity to pt tol x15'   Electrical Stimulation Goals Pain     Manual Therapy   Manual Therapy Soft tissue mobilization   Manual therapy comments pt sidelying   Soft tissue mobilization STM & DTM to R  piriformis                  PT Short Term Goals - 05/25/16 1405      PT SHORT TERM GOAL #1   Title Independent with initial HEP by 05/19/16   Status Achieved           PT Long Term Goals - 05/05/16 0939      PT LONG TERM GOAL #1   Title Independent with advanced HEP as indicated by 06/16/16   Status On-going     PT LONG TERM GOAL #2   Title Pt will demonstrate lumbar ROM 50% of normal or greater w/o increased pain  by 06/16/16   Status On-going     PT LONG TERM GOAL #3   Title Pt will report ability to perform normal ADL's and light household chores without increased low back or leg pain by 06/16/16   Status On-going     PT LONG TERM GOAL #4   Title Pt will be able to walk long enough to complete normal shopping trip w/o limitation due to LBP or radicular pain by 06/16/16   Status On-going               Plan - 05/30/16 1402    Clinical Impression Statement Pt reports MD told her she should be able to transition to HEP after remaining visits in current POC, therefore HEPs from initial to current reviewed with pt requiring minor clarifications for more recent additions, but otherwise demonstrating good recall of HEP. Pt was painfree yesterday but noting mild R low back buttock pain today. SIJ checked and found to be in neutral alignment, therefore continued manual therapy to R piriformis and concluded treatment with estim & moist heat. Anticipate discharge at next visit if no further issues identified.   Rehab Potential Good   Clinical Impairments Affecting Rehab Potential extensive PMH including chronic back and neck pain, DM, HTN, h/o TIA, peripheral neuropathy (refer to problem list & history for full PMH)   PT Treatment/Interventions Patient/family education;Neuromuscular re-education;Therapeutic exercise;Therapeutic activities;Functional mobility training;Manual techniques;Dry needling;Taping;Electrical Stimulation;Moist  Heat;Cryotherapy;Traction;Ultrasound;Iontophoresis 4mg /ml Dexamethasone;ADLs/Self Care Home Management   PT Next Visit Plan 10th visit FOTO & G-code, probable D/C   Consulted and Agree with Plan of Care Patient      Patient will benefit from skilled therapeutic intervention in order to improve the following deficits and impairments:  Pain, Increased muscle spasms, Impaired flexibility, Decreased range of motion, Decreased strength, Postural dysfunction, Improper body mechanics, Difficulty walking, Decreased activity tolerance  Visit Diagnosis: Bilateral low back pain with right-sided sciatica, unspecified chronicity  Difficulty in walking, not elsewhere classified  Abnormal posture     Problem List Patient Active Problem List   Diagnosis Date Noted  . Nonrheumatic aortic valve stenosis 05/23/2016  . Aortic atherosclerosis ([place]) 05/23/2016  . Hematuria 03/16/2016  . 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 Pain of upper abdomen 06/06/2015  . Neck pain 04/22/2015  . Ear pain 02/28/2015  . Abnormal urine 11/21/2014  . 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 Anemia 06/08/2014  . RLS (restless legs syndrome) 11/23/2013  . Diabetic peripheral neuropathy ([place]) 10/29/2013  . Left-sided thoracic back pain 10/06/2013  . Encounter for preventative adult health care exam with abnormal findings 09/14/2013  . Iron deficiency anemia   . Status post laparoscopy 02/25/2013  . GERD (gastroesophageal reflux disease) 01/09/2013  . Amaurosis fugax of left eye 10/16/2012  . Low back pain 06/03/2012  . Vitamin D deficiency 03/11/2012  . Bilateral hand pain 10/20/2011  . Encounter for long-term (current) use of other medications 10/20/2011  . IBS (irritable bowel syndrome) 08/14/2011  . TIA (transient ischemic attack) 02/10/2011  . Abnormal brain MRI 01/19/2011  . Allergic rhinitis 10/01/2010  . 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 FUNDIC GLAND POLYPS OF STOMACH  03/18/2010  . EPIGASTRIC PAIN chronic 03/18/2010  . ARTHRALGIA 03/17/2009  . SYSTOLIC MURMUR 23/55/7322  . Paroxysmal supraventricular tachycardia ([place]) 01/12/2009  . PLANTAR FASCIITIS 06/08/2008  . CHEST PAIN 05/18/2008  . Gastroparesis 12/18/2007  . HYPERCHOLESTEROLEMIA 06/11/2007  . Diabetes mellitus type 2 in obese ([place]) 09/05/2006  . Essential hypertension  09/05/2006    Percival Spanish, PT, MPT 05/31/2016, 9:12 AM  [facility] [address]  [place], [place], [address] Phone: [phone]   Fax:  [phone]  Name: IDALI LAFEVER MRN: [id number] Date of Birth: [birth year]

## 2016-06-01 ENCOUNTER — Ambulatory Visit: Payer: Medicare Other | Admitting: Physical Therapy

## 2016-06-01 DIAGNOSIS — R293 Abnormal posture: Secondary | ICD-10-CM

## 2016-06-01 DIAGNOSIS — R262 Difficulty in walking, not elsewhere classified: Secondary | ICD-10-CM

## 2016-06-01 DIAGNOSIS — M5441 Lumbago with sciatica, right side: Secondary | ICD-10-CM | POA: Diagnosis not present

## 2016-06-01 NOTE — Therapy (Addendum)
[facility] [address]  [place], [place], [address] Phone: [phone]   Fax:  [phone]  Physical Therapy Treatment  Patient Details  Name: [name] MRN: [id number] Date of Birth: September 13, 1940 Referring Provider: Penni Homans, MD  Encounter Date: 06/01/2016      PT End of Session - 06/01/16 1315    Visit Number 10   Number of Visits 12   Date for PT Re-Evaluation 06/16/16   Authorization Type AARP - VL: [place]   PT Start Time 1315   PT Stop Time 1354   PT Time Calculation (min) 39 min   Activity Tolerance Patient tolerated treatment well   Behavior During Therapy [facility] for tasks assessed/performed      Past Medical History:  Diagnosis Date  . Anemia 06/08/2014  . Anxiety   . Arthritis    Spinal Osteoarthritis  . Cancer ([place])   . Carcinoid tumor of stomach   . Cataract   . Chest pain    Myoview 12/15 no ischemia.  . Chronic kidney disease    Left kidney smaller than right kidney  . Diabetes mellitus type 2 in obese ([place]) 09/05/2006   Qualifier: Diagnosis of  By: Marca Ancona RMA, Lucy    . Diabetic peripheral neuropathy ([place]) 10/29/2013  . Diverticulosis 08/30/2000   Colonoscopy   . Encounter for preventative adult health care exam with abnormal findings 09/14/2013  . Esophageal reflux   . Gastric polyp    Fundic Gland  . Gastroparesis   . Headache(784.0)   . Heart murmur    Echocardiogram 2/11: EF 60-65%, mild LAE, grade 1 diastolic dysfunction, aortic valve sclerosis, mean gradient 9 mm of mercury, PASP 34  . Hematuria 03/16/2016  . Iron deficiency anemia, unspecified   . 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 Leg swelling    bilateral  . Neck pain 04/22/2015  . PONV (postoperative nausea and vomiting)    pt states only needs small amount of anesthesia  . PSVT (paroxysmal supraventricular tachycardia) ([place])   . Pure hypercholesterolemia   . 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 Stroke ([place])    tia, 2014  . 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 Type II or unspecified type diabetes mellitus without mention of complication, not stated as uncontrolled   . Unspecified essential hypertension   . Unspecified hereditary and idiopathic peripheral neuropathy 10/29/2013    Past Surgical History:  Procedure Laterality Date  . CHOLECYSTECTOMY  1993  . COLONOSCOPY  11/11/2010   diverticulosis  . DILATATION & CURRETTAGE/HYSTEROSCOPY WITH RESECTOCOPE N/A 02/25/2013   Procedure: Attempted hysteroscopy with uterine perforation;  Surgeon: Jamey Reas de Berton Lan, MD;  Location: [facility];  Service: Gynecology;  Laterality: N/A;  . ESOPHAGOGASTRODUODENOSCOPY  08/29/2010; 09/15/2010   Carcinoid tumor less than 1 cm in July 2012 not seen in August 2012 , gastritis, fundic gland polyps  . ESOPHAGOGASTRODUODENOSCOPY  05/16/2011  . ESOPHAGOGASTRODUODENOSCOPY  06/14/2012  . EUS  12/15/2010   Procedure: UPPER ENDOSCOPIC ULTRASOUND (EUS) LINEAR;  Surgeon: Owens Loffler, MD;  Location: [facility];  Service: Endoscopy;  Laterality: N/A;  . EYE SURGERY Bilateral    Bi lateral cateracts and bi lateral laser  . LAPAROSCOPY N/A 02/25/2013   Procedure: Cystoscopy and laparoscopy with fulguration of uterine serosa;  Surgeon: Jamey Reas de Berton Lan, MD;  Location: [facility];  Service: Gynecology;  Laterality: N/A;  . TONSILLECTOMY      There were no vitals filed for this  visit.      Subjective Assessment - 06/01/16 1320    Subjective Pt reports she tried cleaning and vacuuming for the first time since starting PT, and did note some discomfort while performing vacuuming but states this resolved when she finished. Otherwise has been able to do chores such as changing bed linens and washing laundry w/o increased pain.   Patient Stated Goals To be able travel w/o limitation d/t pain. (planning trip to [facility])   Currently in Pain? Yes   Pain Score --  1-2/10   Pain Location Back   Pain Orientation Lower;Right;Left    Pain Descriptors / Indicators Aching   Pain Type Acute pain;Chronic pain   Pain Radiating Towards none recently   Pain Frequency Intermittent   Effect of Pain on Daily Activities limited tolerance for household chores            [facility] PT Assessment - 06/01/16 1315      Assessment   Medical Diagnosis Low back pain + R sciatica   Referring Provider Penni Homans, MD   Next MD Visit 06/15/16     Observation/Other Assessments   Focus on Therapeutic Outcomes (FOTO)  Lumbar spine - 65% (35% limitation)     AROM   Lumbar Flexion hands to mid shin   Lumbar Extension 50%   Lumbar - Right Side Bend hand to 2" above knee   Lumbar - Left Side Bend hand to 3" above knee   Lumbar - Right Rotation 70%   Lumbar - Left Rotation 75%     Strength   Right Hip Flexion 4/5   Right Hip Extension 4-/5   Right Hip External Rotation  4/5   Right Hip Internal Rotation 4+/5   Right Hip ABduction 4+/5   Right Hip ADduction 4/5   Left Hip Flexion 4+/5   Left Hip Extension 4/5   Left Hip External Rotation 4-/5   Left Hip Internal Rotation 4+/5   Left Hip ABduction 4+/5   Left Hip ADduction 4/5                     [facility] Adult PT Treatment/Exercise - 06/01/16 0001      Lumbar Exercises: Standing   Row Both;10 reps;Theraband  2 sets   Theraband Level (Row) Level 2 (Red)   Row Limitations staggered stance   Shoulder Extension Both;10 reps;Theraband   Theraband Level (Shoulder Extension) Level 2 (Red)   Shoulder Extension Limitations staggered stance   Other Standing Lumbar Exercises B pallof press with red TB 10x3"                  PT Short Term Goals - 05/25/16 1405      PT SHORT TERM GOAL #1   Title Independent with initial HEP by 05/19/16   Status Achieved           PT Long Term Goals - 06/01/16 1324      PT LONG TERM GOAL #1   Title Independent with advanced HEP as indicated by 06/16/16   Status Achieved     PT LONG TERM GOAL #2   Title Pt will  demonstrate lumbar ROM 50% of normal or greater w/o increased pain by 06/16/16   Status Achieved     PT LONG TERM GOAL #3   Title Pt will report ability to perform normal ADL's and light household chores without increased low back or leg pain by 06/16/16   Status Achieved  only  noting increased pain when vacuuming     PT LONG TERM GOAL #4   Title Pt will be able to walk long enough to complete normal shopping trip w/o limitation due to LBP or radicular pain by 06/16/16   Status Achieved               Plan - 2016-06-21 1324    Clinical Impression Statement Pt has demonstrated excellent progress with PT with significant reduction in pain reports along with improved lumbar ROM and LE strength allowing for resumption of normal ADL's and light household chores. Pt is still noting some slight discomfort with heavy chores such as vacuuming, but able to verbalize good understanding of proper body mechanics for household chores. HEP and posture/body mechanics reviewed with pt demonstrating good understanding/recall. Pt ready to transition to HEP at this time. Will place pt on hold for 30 days in the event that further issues arise.   Rehab Potential Good   Clinical Impairments Affecting Rehab Potential extensive PMH including chronic back and neck pain, DM, HTN, h/o TIA, peripheral neuropathy (refer to problem list & history for full PMH)   PT Treatment/Interventions Patient/family education;Neuromuscular re-education;Therapeutic exercise;Therapeutic activities;Functional mobility training;Manual techniques;Dry needling;Taping;Electrical Stimulation;Moist Heat;Cryotherapy;Traction;Ultrasound;Iontophoresis 80m/ml Dexamethasone;ADLs/Self Care Home Management   PT Next Visit Plan 30 day hold   Consulted and Agree with Plan of Care Patient      Patient will benefit from skilled therapeutic intervention in order to improve the following deficits and impairments:  Pain, Increased muscle spasms, Impaired  flexibility, Decreased range of motion, Decreased strength, Postural dysfunction, Improper body mechanics, Difficulty walking, Decreased activity tolerance  Visit Diagnosis: Bilateral low back pain with right-sided sciatica, unspecified chronicity  Difficulty in walking, not elsewhere classified  Abnormal posture       G-Codes - 005/16/20181413    Functional Assessment Tool Used (Outpatient Only) Lumbar spine FOTO = 65% (35% limitation)   Functional Limitation Mobility: Walking and moving around   Mobility: Walking and Moving Around Goal Status [phone] At least 40 percent but less than 60 percent impaired, limited or restricted   Mobility: Walking and Moving Around Discharge Status [phone] At least 20 percent but less than 40 percent impaired, limited or restricted      Problem List Patient Active Problem List   Diagnosis Date Noted  . Nonrheumatic aortic valve stenosis 05/23/2016  . Aortic atherosclerosis ([place] 05/23/2016  . Hematuria 03/16/2016  . 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 Pain of upper abdomen 06/06/2015  . Neck pain 04/22/2015  . Ear pain 02/28/2015  . Abnormal urine 11/21/2014  . 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 Anemia 06/08/2014  . RLS (restless legs syndrome) 11/23/2013  . Diabetic peripheral neuropathy ([place] 10/29/2013  . Left-sided thoracic back pain 10/06/2013  . Encounter for preventative adult health care exam with abnormal findings 09/14/2013  . Iron deficiency anemia   . Status post laparoscopy 02/25/2013  . GERD (gastroesophageal reflux disease) 01/09/2013  . Amaurosis fugax of left eye 10/16/2012  . Low back pain 06/03/2012  . Vitamin D deficiency 03/11/2012  . Bilateral hand pain 10/20/2011  . Encounter for long-term (current) use of other medications 10/20/2011  . IBS (irritable bowel syndrome) 08/14/2011  . TIA (transient ischemic attack) 02/10/2011  . Abnormal brain MRI 01/19/2011  . Allergic rhinitis 10/01/2010  .  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 FUNDIC GLAND POLYPS OF STOMACH 03/18/2010  . EPIGASTRIC PAIN chronic 03/18/2010  . ARTHRALGIA 03/17/2009  .  SYSTOLIC MURMUR 52/77/8242  . Paroxysmal supraventricular tachycardia ([place]) 01/12/2009  . PLANTAR FASCIITIS 06/08/2008  . CHEST PAIN 05/18/2008  . Gastroparesis 12/18/2007  . HYPERCHOLESTEROLEMIA 06/11/2007  . Diabetes mellitus type 2 in obese ([place]) 09/05/2006  . Essential hypertension 09/05/2006    Percival Spanish, PT, MPT 06/01/2016, 2:16 PM  [facility] [address]  [place], [place], [address] Phone: [phone]   Fax:  [phone]  Name: [name] MRN: [id number] Date of Birth: [birth year]  PHYSICAL THERAPY DISCHARGE SUMMARY  Visits from Start of Care: 10  Current functional level related to goals / functional outcomes:   Refer to above clinical impression. Pt has not needed to return in >30 days, therefore will proceed with discharge from PT for this episode.   Remaining deficits:    As above.   Education / Equipment:   HEP, Training and development officer education  Plan: Patient agrees to discharge.  Patient goals were met. Patient is being discharged due to meeting the stated rehab goals.  ?????     Percival Spanish, PT, MPT 07/11/16, 8:18 AM  [facility] [address]  [place], [place], [address] Phone: [phone]   Fax:  [phone]

## 2016-06-08 ENCOUNTER — Telehealth: Payer: Self-pay | Admitting: Family Medicine

## 2016-06-08 ENCOUNTER — Other Ambulatory Visit: Payer: Self-pay | Admitting: Family Medicine

## 2016-06-08 NOTE — Telephone Encounter (Signed)
Ok to refill but make sure she has an appt in next 4-6 weeks and that she gets a Teacher, English as a foreign language

## 2016-06-08 NOTE — Telephone Encounter (Signed)
[place] callled to see why rx for   ALPRAZolam Duanne Moron) 0.25 MG tablet   Was denied   Call back  [phone]

## 2016-06-08 NOTE — Telephone Encounter (Signed)
Requesting:  Alprazolam Contract   none UDS   none Last OV     01/11/16 Last Refill     #30 with 1 refills on 05/25/2015  Please Advise

## 2016-06-09 ENCOUNTER — Other Ambulatory Visit (INDEPENDENT_AMBULATORY_CARE_PROVIDER_SITE_OTHER): Payer: Medicare Other

## 2016-06-09 DIAGNOSIS — E7800 Pure hypercholesterolemia, unspecified: Secondary | ICD-10-CM | POA: Diagnosis not present

## 2016-06-09 DIAGNOSIS — E559 Vitamin D deficiency, unspecified: Secondary | ICD-10-CM | POA: Diagnosis not present

## 2016-06-09 DIAGNOSIS — I10 Essential (primary) hypertension: Secondary | ICD-10-CM

## 2016-06-09 LAB — COMPREHENSIVE METABOLIC PANEL
ALT: 24 U/L (ref 0–35)
AST: 20 U/L (ref 0–37)
Albumin: 4.4 g/dL (ref 3.5–5.2)
Alkaline Phosphatase: 46 U/L (ref 39–117)
BILIRUBIN TOTAL: 0.6 mg/dL (ref 0.2–1.2)
BUN: 21 mg/dL (ref 6–23)
CHLORIDE: 102 meq/L (ref 96–112)
CO2: 25 meq/L (ref 19–32)
Calcium: 10 mg/dL (ref 8.4–10.5)
Creatinine, Ser: 0.7 mg/dL (ref 0.40–1.20)
GFR: 86.66 mL/min (ref >60.00)
Glucose, Bld: 126 mg/dL — ABNORMAL HIGH (ref 70–99)
Potassium: 4 mEq/L (ref 3.5–5.1)
Sodium: 136 mEq/L (ref 135–145)
Total Protein: 7.1 g/dL (ref 6.0–8.3)

## 2016-06-09 LAB — CBC
HCT: 39 % (ref 36.0–46.0)
HEMOGLOBIN: 13.5 g/dL (ref 12.0–15.0)
MCHC: 34.5 g/dL (ref 30.0–36.0)
MCV: 86.1 fl (ref 78.0–100.0)
Platelets: 221 10*3/uL (ref 150.0–400.0)
RBC: 4.53 Mil/uL (ref 3.87–5.11)
RDW: 12.4 % (ref 11.5–15.5)
WBC: 8.6 10*3/uL (ref 4.0–10.5)

## 2016-06-09 LAB — LIPID PANEL
CHOL/HDL RATIO: 2
CHOLESTEROL: 181 mg/dL (ref 0–200)
HDL: 72.6 mg/dL (ref >39.00)
LDL Cholesterol: 85 mg/dL (ref 0–99)
NonHDL: 108.6
TRIGLYCERIDES: 119 mg/dL (ref 0.0–149.0)
VLDL: 23.8 mg/dL (ref 0.0–40.0)

## 2016-06-09 LAB — TSH: TSH: 1.73 u[IU]/mL (ref 0.35–4.50)

## 2016-06-09 LAB — VITAMIN D 25 HYDROXY (VIT D DEFICIENCY, FRACTURES): VITD: 52.66 ng/mL (ref 30.00–100.00)

## 2016-06-09 MED ORDER — ALPRAZOLAM 0.25 MG PO TABS: 0.2500 mg | ORAL_TABLET | Freq: Two times a day (BID) | ORAL | 1 refills | Status: DC | PRN

## 2016-06-09 NOTE — Telephone Encounter (Signed)
Once PCP signed will fax hardcopy to [facility] [address] [place] 

## 2016-06-15 ENCOUNTER — Encounter: Payer: Self-pay | Admitting: Family Medicine

## 2016-06-15 ENCOUNTER — Ambulatory Visit (INDEPENDENT_AMBULATORY_CARE_PROVIDER_SITE_OTHER): Payer: Medicare Other | Admitting: Family Medicine

## 2016-06-15 VITALS — BP 125/77 | HR 68 | Temp 98.3°F | Resp 18 | Wt 170.8 lb

## 2016-06-15 DIAGNOSIS — E559 Vitamin D deficiency, unspecified: Secondary | ICD-10-CM | POA: Diagnosis not present

## 2016-06-15 DIAGNOSIS — D538 Other specified nutritional anemias: Secondary | ICD-10-CM

## 2016-06-15 DIAGNOSIS — E1169 Type 2 diabetes mellitus with other specified complication: Secondary | ICD-10-CM

## 2016-06-15 DIAGNOSIS — E782 Mixed hyperlipidemia: Secondary | ICD-10-CM | POA: Diagnosis not present

## 2016-06-15 DIAGNOSIS — E669 Obesity, unspecified: Secondary | ICD-10-CM | POA: Diagnosis not present

## 2016-06-15 DIAGNOSIS — I10 Essential (primary) hypertension: Secondary | ICD-10-CM | POA: Diagnosis not present

## 2016-06-15 DIAGNOSIS — E7800 Pure hypercholesterolemia, unspecified: Secondary | ICD-10-CM

## 2016-06-15 DIAGNOSIS — D500 Iron deficiency anemia secondary to blood loss (chronic): Secondary | ICD-10-CM

## 2016-06-15 NOTE — Progress Notes (Signed)
Subjective:  I acted as a Education administrator for Dr. Charlett Blake. [name], [place]   [name], female    DOB: May 01, 1940, 76 y.o.   MRN: [id number]  Chief Complaint  Patient presents with  . Follow-up    HPI  Patient is in today for 3 month follow up. Following up on DM, HTN, and other medical concerns. She is feeling well. No recent febrile illness or hospitalizations. Denies CP/palp/SOB/HA/congestion/fevers/GI or GU c/o. Taking meds as prescribed  Patient Care Team: [name], MD as PCP - General (Family Medicine) Gatha Mayer, MD as Consulting Physician (Gastroenterology) Love, Alyson Locket, MD (Neurology) Minus Breeding, MD as Attending Physician (Cardiology)   Past Medical History:  Diagnosis Date  . Anemia 06/08/2014  . Anxiety   . Arthritis    Spinal Osteoarthritis  . Cancer ([place])   . Carcinoid tumor of stomach   . Cataract   . Chest pain    Myoview 12/15 no ischemia.  . Chronic kidney disease    Left kidney smaller than right kidney  . Diabetes mellitus type 2 in obese ([place]) 09/05/2006   Qualifier: Diagnosis of  By: Marca Ancona RMA, Lucy    . Diabetic peripheral neuropathy ([place]) 10/29/2013  . Diverticulosis 08/30/2000   Colonoscopy   . Encounter for preventative adult health care exam with abnormal findings 09/14/2013  . Esophageal reflux   . Gastric polyp    Fundic Gland  . Gastroparesis   . Headache(784.0)   . Heart murmur    Echocardiogram 2/11: EF 60-65%, mild LAE, grade 1 diastolic dysfunction, aortic valve sclerosis, mean gradient 9 mm of mercury, PASP 34  . Hematuria 03/16/2016  . Iron deficiency anemia, unspecified   . 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 Leg swelling    bilateral  . Neck pain 04/22/2015  . PONV (postoperative nausea and vomiting)    pt states only needs small amount of anesthesia  . PSVT (paroxysmal supraventricular tachycardia) ([place])   . Pure hypercholesterolemia   . 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 Stroke ([place])    tia, 2014  . 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 Type II or unspecified type diabetes mellitus without mention of complication, not stated as uncontrolled   . Unspecified essential hypertension   . Unspecified hereditary and idiopathic peripheral neuropathy 10/29/2013    Past Surgical History:  Procedure Laterality Date  . CHOLECYSTECTOMY  1993  . COLONOSCOPY  11/11/2010   diverticulosis  . DILATATION & CURRETTAGE/HYSTEROSCOPY WITH RESECTOCOPE N/A 02/25/2013   Procedure: Attempted hysteroscopy with uterine perforation;  Surgeon: Jamey Reas de Berton Lan, MD;  Location: [facility];  Service: Gynecology;  Laterality: N/A;  . ESOPHAGOGASTRODUODENOSCOPY  08/29/2010; 09/15/2010   Carcinoid tumor less than 1 cm in July 2012 not seen in August 2012 , gastritis, fundic gland polyps  . ESOPHAGOGASTRODUODENOSCOPY  05/16/2011  . ESOPHAGOGASTRODUODENOSCOPY  06/14/2012  . EUS  12/15/2010   Procedure: UPPER ENDOSCOPIC ULTRASOUND (EUS) LINEAR;  Surgeon: Owens Loffler, MD;  Location: [facility];  Service: Endoscopy;  Laterality: N/A;  . EYE SURGERY Bilateral    Bi lateral cateracts and bi lateral laser  . LAPAROSCOPY N/A 02/25/2013   Procedure: Cystoscopy and laparoscopy with fulguration of uterine serosa;  Surgeon: Jamey Reas de Berton Lan, MD;  Location: [facility];  Service: Gynecology;  Laterality: N/A;  . TONSILLECTOMY      Family History  Problem Relation Age of Onset  . Diabetes Mother   . Stroke Father  deceased age 73  . Heart disease Sister        deceased MI age 66  . Heart disease Brother        deceased MI age 57  . Diabetes Maternal Grandmother   . Hypertension Paternal Grandmother   . Diabetes Sister   . Heart disease Sister   . Hypertension Sister   . Hyperlipidemia Sister   . Diabetes Sister   . Heart disease Sister   . Hypertension Sister   . Hyperlipidemia Sister   . Diabetes Brother   . Heart disease Brother   . Hypertension Brother   . Hyperlipidemia Brother   . Colon cancer Neg Hx   .  Esophageal cancer Neg Hx   . Stomach cancer Neg Hx   . Rectal cancer Neg Hx     Social History   Social History  . Marital status: Married    Spouse name: N/A  . Number of children: 0  . Years of education: college   Occupational History  . Retail    Social History Main Topics  . Smoking status: Never Smoker  . Smokeless tobacco: Never Used     Comment: Never used tobacco  . Alcohol use No  . Drug use: No  . Sexual activity: No     Comment: lives alone, no dietary restrictions except avoid fresh veg, fruit, whole grains   Other Topics Concern  . Not on file   Social History Narrative   Patient was married ([name]) - widow   Patient does not have any children.   Patient is right-handed.   Patient has a BA degree.   One caffeine drink daily     Outpatient Medications Prior to Visit  Medication Sig Dispense Refill  . acetaminophen (TYLENOL) 500 MG tablet Take 2 tablets (1,000 mg total) by mouth every 6 (six) hours as needed for moderate pain. 30 tablet 0  . ALPRAZolam (XANAX) 0.25 MG tablet Take 1 tablet (0.25 mg total) by mouth 2 (two) times daily as needed for anxiety. 30 tablet 1  . amLODipine (NORVASC) 5 MG tablet Take 1 tablet by mouth  daily 90 tablet 3  . aspirin 81 MG tablet Take 81 mg by mouth daily.    . cholecalciferol (VITAMIN D) 1000 UNITS tablet Take 1,000 Units by mouth daily.      Marland Kitchen dicyclomine (BENTYL) 10 MG capsule Take 1 capsule (10 mg total) by mouth 4 (four) times daily as needed. 120 capsule 6  . esomeprazole (NEXIUM) 40 MG capsule Take 1 capsule (40 mg total) by mouth daily before breakfast. 30 capsule 11  . FOLBIC 2.5-25-2 MG TABS tablet TAKE 1 TABLET BY MOUTH EVERY DAY 90 tablet 3  . glucose blood test strip Check twice daily and as needed.  DX E11.9 100 each 3  . Lancets MISC Use twice daily to check blood sugar. DX E11.9 200 each 2  . metFORMIN (GLUCOPHAGE-XR) 500 MG 24 hr tablet TAKE 1 TABLET BY MOUTH 3  TIMES DAILY WITH MEALS 270 tablet 1  .  metoCLOPramide (REGLAN) 5 MG tablet TAKE 1 TABLET BY MOUTH TWO  TIMES DAILY 180 tablet 1  . metoprolol (LOPRESSOR) 50 MG tablet Take 75 mg in the morning and 50 mg in the evening 225 tablet 3  . Multiple Vitamin (MULTIVITAMIN) tablet Take 1 tablet by mouth daily.      . Multiple Vitamins-Calcium (VIACTIV MULTI-VITAMIN) CHEW Chew 2 each by mouth daily.    . ondansetron (ZOFRAN) 4 MG  tablet Take 4 mg by mouth every 4 (four) hours as needed. For nausea      . potassium chloride (K-DUR,KLOR-CON) 10 MEQ tablet TAKE 1 TABLET BY MOUTH  DAILY 90 tablet 2  . rosuvastatin (CRESTOR) 10 MG tablet TAKE 1 TABLET BY MOUTH  DAILY 90 tablet 2  . valsartan (DIOVAN) 320 MG tablet Take 1 tablet by mouth  daily 90 tablet 3  . tiZANidine (ZANAFLEX) 4 MG tablet   0  . vitamin E 400 UNIT capsule Take 400 Units by mouth daily.    Marland Kitchen zoster vaccine live, PF, (ZOSTAVAX) 04888 UNT/0.65ML injection Inject 19,400 Units into the skin once. 1 each 0  . 0.9 %  sodium chloride infusion      No facility-administered medications prior to visit.     Allergies  Allergen Reactions  . Tramadol     Dizziness     Review of Systems  Constitutional: Negative for fever and malaise/fatigue.  HENT: Negative for congestion.   Eyes: Negative for blurred vision.  Respiratory: Negative for shortness of breath.   Cardiovascular: Negative for chest pain, palpitations and leg swelling.  Gastrointestinal: Negative for abdominal pain, blood in stool and nausea.  Genitourinary: Negative for dysuria and frequency.  Musculoskeletal: Negative for falls.  Skin: Negative for rash.  Neurological: Negative for dizziness, loss of consciousness and headaches.  Endo/Heme/Allergies: Negative for environmental allergies.  Psychiatric/Behavioral: Negative for depression and memory loss. The patient is nervous/anxious.        Objective:    Physical Exam  BP 125/77 (BP Location: Left Arm, Patient Position: Sitting, Cuff Size: Normal)   Pulse  68   Temp 98.3 F (36.8 C) (Oral)   Resp 18   Wt 170 lb 12.8 oz (77.5 kg)   LMP 02/07/1992   SpO2 99%   BMI 32.27 kg/m  Wt Readings from Last 3 Encounters:  06/15/16 170 lb 12.8 oz (77.5 kg)  05/29/16 170 lb (77.1 kg)  05/22/16 169 lb (76.7 kg)   BP Readings from Last 3 Encounters:  06/15/16 125/77  05/29/16 140/76  05/22/16 (!) 172/78     Immunization History  Administered Date(s) Administered  . Influenza Split 10/31/2010, 11/28/2011  . Influenza Whole 11/19/2007, 11/04/2008, 11/10/2009  . Influenza, High Dose Seasonal PF 11/11/2015  . Influenza,inj,Quad PF,36+ Mos 01/09/2013, 10/20/2013, 11/13/2014  . Pneumococcal Conjugate-13 09/11/2013  . Pneumococcal Polysaccharide-23 03/13/2007  . Td 03/02/2005    Health Maintenance  Topic Date Due  . TETANUS/TDAP  03/03/2015  . FOOT EXAM  11/13/2015  . INFLUENZA VACCINE  09/06/2016  . HEMOGLOBIN A1C  12/16/2016  . OPHTHALMOLOGY EXAM  02/06/2017  . COLONOSCOPY  11/10/2020  . DEXA SCAN  Completed  . PNA vac Low Risk Adult  Completed    Lab Results  Component Value Date   WBC 8.6 06/09/2016   HGB 13.5 06/09/2016   HCT 39.0 06/09/2016   PLT 221.0 06/09/2016   GLUCOSE 126 (H) 06/09/2016   CHOL 181 06/09/2016   TRIG 119.0 06/09/2016   HDL 72.60 06/09/2016   LDLDIRECT 123.7 04/13/2011   LDLCALC 85 06/09/2016   ALT 24 06/09/2016   AST 20 06/09/2016   NA 136 06/09/2016   K 4.0 06/09/2016   CL 102 06/09/2016   CREATININE 0.70 06/09/2016   BUN 21 06/09/2016   CO2 25 06/09/2016   TSH 1.73 06/09/2016   INR 0.88 01/16/2011   HGBA1C 6.4 06/15/2016   MICROALBUR 1.6 11/13/2014    Lab Results  Component Value Date  TSH 1.73 06/09/2016   Lab Results  Component Value Date   WBC 8.6 06/09/2016   HGB 13.5 06/09/2016   HCT 39.0 06/09/2016   MCV 86.1 06/09/2016   PLT 221.0 06/09/2016   Lab Results  Component Value Date   NA 136 06/09/2016   K 4.0 06/09/2016   CHLORIDE 101 07/08/2015   CO2 25 06/09/2016   GLUCOSE  126 (H) 06/09/2016   BUN 21 06/09/2016   CREATININE 0.70 06/09/2016   BILITOT 0.6 06/09/2016   ALKPHOS 46 06/09/2016   AST 20 06/09/2016   ALT 24 06/09/2016   PROT 7.1 06/09/2016   ALBUMIN 4.4 06/09/2016   CALCIUM 10.0 06/09/2016   ANIONGAP 8 07/08/2015   EGFR 75 (L) 07/08/2015   GFR 86.66 06/09/2016   Lab Results  Component Value Date   CHOL 181 06/09/2016   Lab Results  Component Value Date   HDL 72.60 06/09/2016   Lab Results  Component Value Date   LDLCALC 85 06/09/2016   Lab Results  Component Value Date   TRIG 119.0 06/09/2016   Lab Results  Component Value Date   CHOLHDL 2 06/09/2016   Lab Results  Component Value Date   HGBA1C 6.4 06/15/2016         Assessment & Plan:   Problem List Items Addressed This Visit    Anemia (Chronic)   Diabetes mellitus type 2 in obese (HCC)    hgba1c acceptable, minimize simple carbs. Increase exercise as tolerated. Continue current meds      Relevant Orders   Hemoglobin A1c (Completed)   Hemoglobin A1c   HYPERCHOLESTEROLEMIA    Tolerating statin, encouraged heart healthy diet, avoid trans fats, minimize simple carbs and saturated fats. Increase exercise as tolerated      Essential hypertension - Primary    Well controlled, no changes to meds. Encouraged heart healthy diet such as the DASH diet and exercise as tolerated.       Relevant Orders   CBC   Comprehensive metabolic panel   TSH   Vitamin D deficiency    Encouraged to take daily supplements and monitor      Relevant Orders   Vitamin D (25 hydroxy)   Iron deficiency anemia    Other Visit Diagnoses    Mixed hyperlipidemia       Relevant Orders   Lipid panel      I have discontinued Ms. Sedler's vitamin E, zoster vaccine live (PF), and tiZANidine. I am also having her maintain her multivitamin, cholecalciferol, ondansetron, acetaminophen, aspirin, esomeprazole, valsartan, amLODipine, metoprolol, metoCLOPramide, dicyclomine, metFORMIN, Lancets,  glucose blood, FOLBIC, VIACTIV MULTI-VITAMIN, potassium chloride, rosuvastatin, and ALPRAZolam. We will stop administering sodium chloride.  No orders of the defined types were placed in this encounter.   CMA served as Education administrator during this visit. History, Physical and Plan performed by medical provider. Documentation and orders reviewed and attested to.  Penni Homans, MD

## 2016-06-15 NOTE — Patient Instructions (Signed)
Ti?nh l???ng carbohydrate trong b?nh ti?u ???ng, Ng??i l?n Carbohydrate Counting for Diabetes Mellitus, Adult Ti?nh l???ng carbohydrate l m?t ph??ng php theo di l??ng carbohydrate m quy? vi? ?n. ?n carbohydrate theo cch t? nhin lm t?ng l??ng ???ng (glucose) trong mu. Tnh l??ng carbohydrate qu v? ?n gip duy tr l??ng glucose trong mu trong gi?i h?n bnh th??ng, qua ? gip qu v? ki?m sot b?nh ti?u ???ng (b?nh ti?u ???ng) c?a mnh. Bi?t ???c l??ng carbohydrate m qu v? c th? ?n m?t cch an ton trong m?i b?a ?n c vai tr quan tr?ng. L??ng carbohydrate ny khc nhau ? m?i ng??i. Chuyn gia v? dinh d??ng v ch? ?? ?n (chuyn gia dinh d??ng c gi?y php hnh ngh?) c th? gip qu v? l?p k? ho?ch cho b?a ?n v tnh l??ng carbohydrate qu v? nn ?n vo m?i b?a ?n chnh v b?a ?n nh?. Carbohydrate co? trong cc lo?i th?c ph?m sau ?y:  Ng? c?c, nh? bnh m v ha?t ng? c?c.  ??u kh v cc s?n ph?m ??u nnh.  Marlou Starks c tinh b?t nh? khoai ty, ??u H Lan v ng.  Tri cy v n??c tri cy.  S?a v s?a chua.  K?o v ?? ?n nhe?, ch?ng h?n nh? bnh ng?t, bnh quy, k?o, khoai ty chin v n??c ng?t. Ti tnh l??ng carbohydrate nh? th? no? C hai cch tnh l???ng carbohydrate trong th?c ?n. Quy? vi? c th? s? d?ng m?t trong hai ph??ng php ho?c k?t h?p c? hai. ??c "Thng tin dinh d???ng" trn th?c ph?m ?ng gi  B?ng "Thng tin dinh d??ng" c trn nhn c?a h?u h?t t?t c? ca?c loa?i th?c ph?m va? ?? u?ng ?o?ng go?i t?i [place]?, bao g?m:  Kh?u ph?n ?n.  Thng tin v? cc ch?t dinh d??ng trong m?i kh?u ph?n, g?m c? s? gam (g) carbohydrate trong m?i kh?u ph?n. ?? s? d?ng "Thng tin dinh d??ng":  Quy?t ??nh s? kh?u ph?n qu v? s? ?n.  Nhn s? kh?u ph?n v??i l??ng carbohydrate trong kh?u ph?n ?.  K?t qu? l t?ng l??ng carbohydrate m qu v? s? ?n. Ti?m hi?u v? kh?u ph?n ?n tiu chu?n c?a cc th?c ph?m khc  Khi ?n th?c ?n c carbohydrate khng ?ng gi ho?c khng co? "Thng tin  dinh d???ng" trn nhn, quy? vi? c?n ?o cc kh?u ph?n ?? ti?nh l??ng carbohydrate:  ?o l??ng th?c ?n qu v? s? ?n b?ng cn th?c ph?m ho?c c?c ?ong, n?u c?n.  Quy?t ??nh s? kh?u ph?n theo kch th??c tiu chu?n qu v? s? ?n.  Nhn s? kh?u ph?n ny v?i 15. H?u h?t cc th?c ph?m giu carbohydrate ??u ch?a kho?ng 15 g carbohydrate trong m?i kh?u ph?n.  V d?, n?u quy? vi? ?n 8 Aox? (170 g) du ty, quy? vi? s? ph?i ?n 2 kh?u ph?n v 30 g carbohydrate (2 kh?u ph?n x 15 g = 30 g).  ??i v?i nh??ng th?c ?n tr?n nhi?u lo?i th?c ph?m nh? sp v th?t h?m, quy? vi? s? ph?i tnh l???ng carbohydrate trong m?i lo?i th?c ph?m c trong ?o?Marland Kitchen D??i ?y l danh sch kh?u ph?n ?n tiu chu?n c?a nh?ng th?c ph?m ph? bi?n giu carbohydrate. M?i kh?u ph?n trong s? ny ??u c kho?ng 15 g carbohydrate:   bnh hamburger ho?c  bnh muffin cu?a Anh.   Aox? (15 mL) xi-r.   Aox? (14 g) th?ch.  1 lt bnh m.  1 bnh m ng (bnh tortilla) c kch th??c 6 inch.  3 Aox? (85 g) c?m ho?c m ?ng.  4 Aox? (113 g) ??u kh n?u chn.  4 Aox? (113 g) rau c nhi?u tinh b?t, ch?ng h?n nh? ??u H Lan, ng, ho?c khoai ty.  4 Aox? (113 g) ng? c?c nng.  4 Aox? (113 g) khoai ty nghi?n ho?c  c? khoai ty l?n n??ng.  4 Aox? (113 g) tri cy ?ng h?p ho?c ?ng l?nh.  4 Aox? (120 mL) n??c p tri cy.  4-6 bnh quy gin.  6 mi?ng th?t g.  6 Aox? (170 g) ng? c?c kh khng ???ng.  6 Aox? (170 g) s?a chua khng bo nguyn ch?t ho?c s?a chua co? thm ch?t lm ng?t nhn t?o.  8 Aox? (240 mL) s?a.  8 Aox? (170 g) tri cy t??i ho?c m?t mi?ng tri cy nh?.  24 Aox? (680 g) b?ng ng. V d? v? cch tnh carbohydrate B?a ?n m?u   3 Aox? (85 g) l???n g.  6 Aox? (170 g) g?o l?c.  4 Aox? (113 g) ng.  8 Aox? (240 mL) s?a.  8 Aox? (170 g) du ty ph?t kem t??i khng ???ng. Ti?nh l???ng carbohydrate  1. Xc ??nh cc th?c ph?m ch?a carbohydrate:  C?m.  Ng.  S?a.  Du ty. 2. Tnh s? kh?u ph?n ?n v?i  m?i th?c ph?m qu v? ?n:  2 kh?u ph?n c?m.  1 kh?u ph?n ng.  1 kh?u ph?n s?a.  1 kh?u ph?n du ty. 3. Nhn m?i s? kh?u ph?n ?o? v??i 15 g:  2 kh?u ph?n c?m x 15 g = 30 g.  1 kh?u ph?n ng x 15 g = 15 g.  1 kh?u ph?n s?a x 15 g = 15 g.  1 kh?u ph?n du ty x 15 g = 15 g. 4. C?ng t?t c? cc s? ? ?? tm ra t?ng s? gam carbohydrate ?n va?o:  30 g + 15 g + 15 g + 15 g = 75 g t?ng l??ng carbohydrate. Thng tin ny khng nh?m m?c ?ch thay th? cho l?i khuyn m chuyn gia ch?m [employer] s?c kh?e ni v?i qu v?. Hy b?o ??m qu v? ph?i th?o lu?n b?t k? v?n ?? g m qu v? c v?i chuyn gia ch?m [place] s?c kh?e c?a qu v?. Document Released: 05/17/2015 Document Revised: 01/11/2016 Document Reviewed: 07/07/2015 Elsevier Interactive Patient Education  2017 [employer].

## 2016-06-16 LAB — HEMOGLOBIN A1C: Hgb A1c MFr Bld: 6.4 % (ref 4.6–6.5)

## 2016-06-18 NOTE — Assessment & Plan Note (Signed)
Tolerating statin, encouraged heart healthy diet, avoid trans fats, minimize simple carbs and saturated fats. Increase exercise as tolerated 

## 2016-06-18 NOTE — Assessment & Plan Note (Signed)
hgba1c acceptable, minimize simple carbs. Increase exercise as tolerated. Continue current meds 

## 2016-06-18 NOTE — Assessment & Plan Note (Signed)
Encouraged to take daily supplements and monitor

## 2016-06-18 NOTE — Assessment & Plan Note (Signed)
Well controlled, no changes to meds. Encouraged heart healthy diet such as the DASH diet and exercise as tolerated.  °

## 2016-08-14 ENCOUNTER — Other Ambulatory Visit: Payer: Self-pay | Admitting: Cardiology

## 2016-08-14 ENCOUNTER — Other Ambulatory Visit: Payer: Self-pay | Admitting: Family Medicine

## 2016-09-11 ENCOUNTER — Telehealth: Payer: Self-pay | Admitting: Cardiology

## 2016-09-11 ENCOUNTER — Other Ambulatory Visit: Payer: Self-pay | Admitting: *Deleted

## 2016-09-11 MED ORDER — IRBESARTAN 300 MG PO TABS: 300.0000 mg | ORAL_TABLET | Freq: Every day | ORAL | 3 refills | Status: DC

## 2016-09-11 NOTE — Telephone Encounter (Signed)
Pt c/o medication issue:  1. Name of Medication: Valsartan   2. How are you currently taking this medication (dosage and times per day)?  320 mg/ Once a day (morning)   3. Are you having a reaction (difficulty breathing--STAT)? no  4. What is your medication issue? Being recalled

## 2016-09-11 NOTE — Telephone Encounter (Signed)
irbesartan (AVAPRO) 300 MG tablet 90 tablet 3 09/11/2016    Sig - Route: Take 1 tablet (300 mg total) by mouth daily. - Oral   Sent to pharmacy as: irbesartan (AVAPRO) 300 MG tablet   E-Prescribing Status: Receipt confirmed by pharmacy (09/11/2016 1:48 PM EDT)    Patient notified valsartan 320mg  QD changed to irbesartan 300mg  QD Advised to check BP every 2-3 days for about 2 weeks to ensure BP well controlled

## 2016-09-14 ENCOUNTER — Other Ambulatory Visit: Payer: Self-pay | Admitting: Pharmacist Clinician (PhC)/ Clinical Pharmacy Specialist

## 2016-09-14 MED ORDER — IRBESARTAN 300 MG PO TABS: 300.0000 mg | ORAL_TABLET | Freq: Every day | ORAL | 1 refills | Status: DC

## 2016-09-18 ENCOUNTER — Other Ambulatory Visit (INDEPENDENT_AMBULATORY_CARE_PROVIDER_SITE_OTHER): Payer: Medicare Other

## 2016-09-18 DIAGNOSIS — E1169 Type 2 diabetes mellitus with other specified complication: Secondary | ICD-10-CM | POA: Diagnosis not present

## 2016-09-18 DIAGNOSIS — E782 Mixed hyperlipidemia: Secondary | ICD-10-CM | POA: Diagnosis not present

## 2016-09-18 DIAGNOSIS — E669 Obesity, unspecified: Secondary | ICD-10-CM

## 2016-09-18 DIAGNOSIS — E559 Vitamin D deficiency, unspecified: Secondary | ICD-10-CM | POA: Diagnosis not present

## 2016-09-18 DIAGNOSIS — I10 Essential (primary) hypertension: Secondary | ICD-10-CM | POA: Diagnosis not present

## 2016-09-18 LAB — LIPID PANEL
CHOLESTEROL: 158 mg/dL (ref 0–200)
HDL: 56 mg/dL (ref >39.00)
LDL Cholesterol: 79 mg/dL (ref 0–99)
NonHDL: 101.57
TRIGLYCERIDES: 111 mg/dL (ref 0.0–149.0)
Total CHOL/HDL Ratio: 3
VLDL: 22.2 mg/dL (ref 0.0–40.0)

## 2016-09-18 LAB — COMPREHENSIVE METABOLIC PANEL
ALBUMIN: 4 g/dL (ref 3.5–5.2)
ALK PHOS: 53 U/L (ref 39–117)
ALT: 22 U/L (ref 0–35)
AST: 21 U/L (ref 0–37)
BUN: 16 mg/dL (ref 6–23)
CALCIUM: 9.9 mg/dL (ref 8.4–10.5)
CO2: 27 mEq/L (ref 19–32)
Chloride: 97 mEq/L (ref 96–112)
Creatinine, Ser: 0.73 mg/dL (ref 0.40–1.20)
GFR: 82.5 mL/min (ref >60.00)
Glucose, Bld: 118 mg/dL — ABNORMAL HIGH (ref 70–99)
POTASSIUM: 4 meq/L (ref 3.5–5.1)
SODIUM: 134 meq/L — AB (ref 135–145)
TOTAL PROTEIN: 6.5 g/dL (ref 6.0–8.3)
Total Bilirubin: 0.7 mg/dL (ref 0.2–1.2)

## 2016-09-18 LAB — CBC
HCT: 39.6 % (ref 36.0–46.0)
Hemoglobin: 13.2 g/dL (ref 12.0–15.0)
MCHC: 33.3 g/dL (ref 30.0–36.0)
MCV: 86.4 fl (ref 78.0–100.0)
Platelets: 223 10*3/uL (ref 150.0–400.0)
RBC: 4.58 Mil/uL (ref 3.87–5.11)
RDW: 12.8 % (ref 11.5–15.5)
WBC: 7.9 10*3/uL (ref 4.0–10.5)

## 2016-09-18 LAB — VITAMIN D 25 HYDROXY (VIT D DEFICIENCY, FRACTURES): VITD: 38.15 ng/mL (ref 30.00–100.00)

## 2016-09-18 LAB — TSH: TSH: 2.19 u[IU]/mL (ref 0.35–4.50)

## 2016-09-18 LAB — HEMOGLOBIN A1C: HEMOGLOBIN A1C: 6.4 % (ref 4.6–6.5)

## 2016-09-25 ENCOUNTER — Ambulatory Visit (INDEPENDENT_AMBULATORY_CARE_PROVIDER_SITE_OTHER): Payer: Medicare Other | Admitting: Family Medicine

## 2016-09-25 ENCOUNTER — Encounter: Payer: Self-pay | Admitting: Family Medicine

## 2016-09-25 VITALS — BP 154/61 | HR 66 | Temp 97.8°F | Ht 61.0 in | Wt 169.0 lb

## 2016-09-25 DIAGNOSIS — E7800 Pure hypercholesterolemia, unspecified: Secondary | ICD-10-CM | POA: Diagnosis not present

## 2016-09-25 DIAGNOSIS — E669 Obesity, unspecified: Secondary | ICD-10-CM

## 2016-09-25 DIAGNOSIS — E1169 Type 2 diabetes mellitus with other specified complication: Secondary | ICD-10-CM

## 2016-09-25 DIAGNOSIS — E559 Vitamin D deficiency, unspecified: Secondary | ICD-10-CM

## 2016-09-25 DIAGNOSIS — K219 Gastro-esophageal reflux disease without esophagitis: Secondary | ICD-10-CM

## 2016-09-25 DIAGNOSIS — I10 Essential (primary) hypertension: Secondary | ICD-10-CM | POA: Diagnosis not present

## 2016-09-25 DIAGNOSIS — D500 Iron deficiency anemia secondary to blood loss (chronic): Secondary | ICD-10-CM

## 2016-09-25 NOTE — Progress Notes (Signed)
Subjective:  I acted as a Education administrator for Dr. Charlett Blake. [name], [place]  [name], female    DOB: 15-Mar-1940, 76 y.o.   MRN: [id number]  Chief Complaint  Patient presents with  . Follow-up    Patient is here today for a 3 month F/U.  She states that she never started the BP med because she read the SE and is fearful and wanted to discuss it with you.    HPI  Patient is in today for a 3 month follow up. She feels well today. She hs returned home from a long overdue trip to [place] to visit family. She notes she had increased trouble with peripheral edema b/l lower extremities. Better now. No recent febrile illness. Denies CP/palp/SOB/HA/congestion/fevers/GI or GU c/o. Taking meds as prescribed  Patient Care Team: [name], MD as PCP - General (Family Medicine) Gatha Mayer, MD as Consulting Physician (Gastroenterology) Love, Alyson Locket, MD (Neurology) Minus Breeding, MD as Attending Physician (Cardiology)   Past Medical History:  Diagnosis Date  . Anemia 06/08/2014  . Anxiety   . Arthritis    Spinal Osteoarthritis  . Cancer ([place])   . Carcinoid tumor of stomach   . Cataract   . Chest pain    Myoview 12/15 no ischemia.  . Chronic kidney disease    Left kidney smaller than right kidney  . Diabetes mellitus type 2 in obese ([place]) 09/05/2006   Qualifier: Diagnosis of  By: Marca Ancona RMA, Lucy    . Diabetic peripheral neuropathy ([place]) 10/29/2013  . Diverticulosis 08/30/2000   Colonoscopy   . Encounter for preventative adult health care exam with abnormal findings 09/14/2013  . Esophageal reflux   . Gastric polyp    Fundic Gland  . Gastroparesis   . Headache(784.0)   . Heart murmur    Echocardiogram 2/11: EF 60-65%, mild LAE, grade 1 diastolic dysfunction, aortic valve sclerosis, mean gradient 9 mm of mercury, PASP 34  . Hematuria 03/16/2016  . Iron deficiency anemia, unspecified   . 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 Leg swelling    bilateral  . Neck pain 04/22/2015    . PONV (postoperative nausea and vomiting)    pt states only needs small amount of anesthesia  . PSVT (paroxysmal supraventricular tachycardia) ([place])   . Pure hypercholesterolemia   . 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 Stroke ([place])    tia, 2014  . 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 Type II or unspecified type diabetes mellitus without mention of complication, not stated as uncontrolled   . Unspecified essential hypertension   . Unspecified hereditary and idiopathic peripheral neuropathy 10/29/2013    Past Surgical History:  Procedure Laterality Date  . CHOLECYSTECTOMY  1993  . COLONOSCOPY  11/11/2010   diverticulosis  . DILATATION & CURRETTAGE/HYSTEROSCOPY WITH RESECTOCOPE N/A 02/25/2013   Procedure: Attempted hysteroscopy with uterine perforation;  Surgeon: Jamey Reas de Berton Lan, MD;  Location: [facility];  Service: Gynecology;  Laterality: N/A;  . ESOPHAGOGASTRODUODENOSCOPY  08/29/2010; 09/15/2010   Carcinoid tumor less than 1 cm in July 2012 not seen in August 2012 , gastritis, fundic gland polyps  . ESOPHAGOGASTRODUODENOSCOPY  05/16/2011  . ESOPHAGOGASTRODUODENOSCOPY  06/14/2012  . EUS  12/15/2010   Procedure: UPPER ENDOSCOPIC ULTRASOUND (EUS) LINEAR;  Surgeon: Owens Loffler, MD;  Location: [facility];  Service: Endoscopy;  Laterality: N/A;  . EYE SURGERY Bilateral    Bi lateral cateracts and bi lateral laser  . LAPAROSCOPY N/A 02/25/2013   Procedure: Cystoscopy  and laparoscopy with fulguration of uterine serosa;  Surgeon: Jamey Reas de Berton Lan, MD;  Location: [facility];  Service: Gynecology;  Laterality: N/A;  . TONSILLECTOMY      Family History  Problem Relation Age of Onset  . Diabetes Mother   . Stroke Father        deceased age 53  . Heart disease Sister        deceased MI age 74  . Heart disease Brother        deceased MI age 73  . Diabetes Maternal Grandmother   . Hypertension Paternal Grandmother   . Diabetes Sister   . Heart disease Sister   . Hypertension  Sister   . Hyperlipidemia Sister   . Diabetes Sister   . Heart disease Sister   . Hypertension Sister   . Hyperlipidemia Sister   . Diabetes Brother   . Heart disease Brother   . Hypertension Brother   . Hyperlipidemia Brother   . Colon cancer Neg Hx   . Esophageal cancer Neg Hx   . Stomach cancer Neg Hx   . Rectal cancer Neg Hx     Social History   Social History  . Marital status: Married    Spouse name: N/A  . Number of children: 0  . Years of education: college   Occupational History  . Retail    Social History Main Topics  . Smoking status: Never Smoker  . Smokeless tobacco: Never Used     Comment: Never used tobacco  . Alcohol use No  . Drug use: No  . Sexual activity: No     Comment: lives alone, no dietary restrictions except avoid fresh veg, fruit, whole grains   Other Topics Concern  . Not on file   Social History Narrative   Patient was married ([name]) - widow   Patient does not have any children.   Patient is right-handed.   Patient has a BA degree.   One caffeine drink daily     Outpatient Medications Prior to Visit  Medication Sig Dispense Refill  . acetaminophen (TYLENOL) 500 MG tablet Take 2 tablets (1,000 mg total) by mouth every 6 (six) hours as needed for moderate pain. 30 tablet 0  . ALPRAZolam (XANAX) 0.25 MG tablet Take 1 tablet (0.25 mg total) by mouth 2 (two) times daily as needed for anxiety. 30 tablet 1  . amLODipine (NORVASC) 5 MG tablet TAKE 1 TABLET BY MOUTH  DAILY 90 tablet 3  . aspirin 81 MG tablet Take 81 mg by mouth daily.    . cholecalciferol (VITAMIN D) 1000 UNITS tablet Take 1,000 Units by mouth daily.      Marland Kitchen dicyclomine (BENTYL) 10 MG capsule Take 1 capsule (10 mg total) by mouth 4 (four) times daily as needed. 120 capsule 6  . esomeprazole (NEXIUM) 40 MG capsule Take 1 capsule (40 mg total) by mouth daily before breakfast. 30 capsule 11  . FOLBIC 2.5-25-2 MG TABS tablet TAKE 1 TABLET BY MOUTH EVERY DAY 90 tablet 3  .  glucose blood test strip Check twice daily and as needed.  DX E11.9 100 each 3  . Lancets MISC Use twice daily to check blood sugar. DX E11.9 200 each 2  . metFORMIN (GLUCOPHAGE-XR) 500 MG 24 hr tablet TAKE 1 TABLET BY MOUTH 3  TIMES DAILY WITH MEALS 270 tablet 0  . metoCLOPramide (REGLAN) 5 MG tablet TAKE 1 TABLET BY MOUTH TWO  TIMES DAILY 180 tablet 1  .  metoprolol (LOPRESSOR) 50 MG tablet Take 75 mg in the morning and 50 mg in the evening 225 tablet 3  . Multiple Vitamin (MULTIVITAMIN) tablet Take 1 tablet by mouth daily.      . Multiple Vitamins-Calcium (VIACTIV MULTI-VITAMIN) CHEW Chew 2 each by mouth daily.    . ondansetron (ZOFRAN) 4 MG tablet Take 4 mg by mouth every 4 (four) hours as needed. For nausea      . potassium chloride (K-DUR,KLOR-CON) 10 MEQ tablet TAKE 1 TABLET BY MOUTH  DAILY 90 tablet 2  . rosuvastatin (CRESTOR) 10 MG tablet TAKE 1 TABLET BY MOUTH  DAILY 90 tablet 2  . irbesartan (AVAPRO) 300 MG tablet Take 1 tablet (300 mg total) by mouth daily. (Patient not taking: Reported on 09/25/2016) 90 tablet 3  . irbesartan (AVAPRO) 300 MG tablet Take 1 tablet (300 mg total) by mouth daily. 90 tablet 1   No facility-administered medications prior to visit.     Allergies  Allergen Reactions  . Tramadol     Dizziness     Review of Systems  Constitutional: Negative for fever and malaise/fatigue.  HENT: Negative for congestion.   Eyes: Negative for blurred vision.  Respiratory: Negative for cough and shortness of breath.   Cardiovascular: Negative for chest pain, palpitations and leg swelling.  Gastrointestinal: Negative for vomiting.  Musculoskeletal: Negative for back pain.  Skin: Negative for rash.  Neurological: Negative for loss of consciousness and headaches.       Objective:    Physical Exam  Constitutional: She is oriented to person, place, and time. She appears well-developed and well-nourished. No distress.  HENT:  Head: Normocephalic and atraumatic.    Eyes: Conjunctivae are normal.  Neck: Normal range of motion. No thyromegaly present.  Cardiovascular: Normal rate and regular rhythm.   Pulmonary/Chest: Effort normal and breath sounds normal. She has no wheezes.  Abdominal: Soft. Bowel sounds are normal. There is no tenderness.  Musculoskeletal: Normal range of motion. She exhibits no edema or deformity.  Neurological: She is alert and oriented to person, place, and time.  Skin: Skin is warm and dry. She is not diaphoretic.  Psychiatric: She has a normal mood and affect.    BP (!) 154/61 (BP Location: Left Arm, Patient Position: Sitting, Cuff Size: Normal)   Pulse 66   Temp 97.8 F (36.6 C) (Oral)   Ht 5' 1"  (1.549 m)   Wt 169 lb (76.7 kg)   LMP 02/07/1992   SpO2 99%   BMI 31.93 kg/m  Wt Readings from Last 3 Encounters:  09/25/16 169 lb (76.7 kg)  06/15/16 170 lb 12.8 oz (77.5 kg)  05/29/16 170 lb (77.1 kg)   BP Readings from Last 3 Encounters:  09/25/16 (!) 154/61  06/15/16 125/77  05/29/16 140/76     Immunization History  Administered Date(s) Administered  . Influenza Split 10/31/2010, 11/28/2011  . Influenza Whole 11/19/2007, 11/04/2008, 11/10/2009  . Influenza, High Dose Seasonal PF 11/11/2015  . Influenza,inj,Quad PF,6+ Mos 01/09/2013, 10/20/2013, 11/13/2014  . Pneumococcal Conjugate-13 09/11/2013  . Pneumococcal Polysaccharide-23 03/13/2007  . Td 03/02/2005    Health Maintenance  Topic Date Due  . TETANUS/TDAP  03/03/2015  . FOOT EXAM  11/13/2015  . INFLUENZA VACCINE  09/06/2016  . OPHTHALMOLOGY EXAM  02/06/2017  . HEMOGLOBIN A1C  03/21/2017  . COLONOSCOPY  11/10/2020  . DEXA SCAN  Completed  . PNA vac Low Risk Adult  Completed    Lab Results  Component Value Date   WBC 7.9  09/18/2016   HGB 13.2 09/18/2016   HCT 39.6 09/18/2016   PLT 223.0 09/18/2016   GLUCOSE 114 (H) 09/25/2016   CHOL 158 09/18/2016   TRIG 111.0 09/18/2016   HDL 56.00 09/18/2016   LDLDIRECT 123.7 04/13/2011   LDLCALC 79  09/18/2016   ALT 19 09/25/2016   AST 19 09/25/2016   NA 134 (L) 09/25/2016   K 4.2 09/25/2016   CL 99 09/25/2016   CREATININE 0.73 09/25/2016   BUN 17 09/25/2016   CO2 28 09/25/2016   TSH 2.19 09/18/2016   INR 0.88 01/16/2011   HGBA1C 6.4 09/18/2016   MICROALBUR 1.6 11/13/2014    Lab Results  Component Value Date   TSH 2.19 09/18/2016   Lab Results  Component Value Date   WBC 7.9 09/18/2016   HGB 13.2 09/18/2016   HCT 39.6 09/18/2016   MCV 86.4 09/18/2016   PLT 223.0 09/18/2016   Lab Results  Component Value Date   NA 134 (L) 09/25/2016   K 4.2 09/25/2016   CHLORIDE 101 07/08/2015   CO2 28 09/25/2016   GLUCOSE 114 (H) 09/25/2016   BUN 17 09/25/2016   CREATININE 0.73 09/25/2016   BILITOT 0.6 09/25/2016   ALKPHOS 55 09/25/2016   AST 19 09/25/2016   ALT 19 09/25/2016   PROT 7.3 09/25/2016   ALBUMIN 3.9 09/25/2016   CALCIUM 9.2 09/25/2016   ANIONGAP 8 07/08/2015   EGFR 75 (L) 07/08/2015   GFR 82.50 09/25/2016   Lab Results  Component Value Date   CHOL 158 09/18/2016   Lab Results  Component Value Date   HDL 56.00 09/18/2016   Lab Results  Component Value Date   LDLCALC 79 09/18/2016   Lab Results  Component Value Date   TRIG 111.0 09/18/2016   Lab Results  Component Value Date   CHOLHDL 3 09/18/2016   Lab Results  Component Value Date   HGBA1C 6.4 09/18/2016         Assessment & Plan:   Problem List Items Addressed This Visit    Diabetes mellitus type 2 in obese ([place])    hgba1c acceptable, minimize simple carbs. Increase exercise as tolerated.      HYPERCHOLESTEROLEMIA    Tolerating statin, encouraged heart healthy diet, avoid trans fats, minimize simple carbs and saturated fats. Increase exercise as tolerated      Vitamin D deficiency   Relevant Orders   VITAMIN D 25 Hydroxy (Vit-D Deficiency, Fractures) (Completed)   GERD (gastroesophageal reflux disease)    Avoid offending foods, start probiotics. Do not eat large meals in late  evening and consider raising head of bed.       Iron deficiency anemia    Increase leafy greens, consider increased lean red meat and using cast iron cookware. Continue to monitor, report any concerns       Other Visit Diagnoses    Hypertension, unspecified type    -  Primary   Relevant Orders   Comprehensive metabolic panel (Completed)   Magnesium (Completed)      I am having Ms. Mifsud maintain her multivitamin, cholecalciferol, ondansetron, acetaminophen, aspirin, esomeprazole, metoprolol tartrate, metoCLOPramide, dicyclomine, Lancets, glucose blood, FOLBIC, VIACTIV MULTI-VITAMIN, potassium chloride, rosuvastatin, ALPRAZolam, amLODipine, metFORMIN, and irbesartan.  No orders of the defined types were placed in this encounter.   CMA served as Education administrator during this visit. History, Physical and Plan performed by medical provider. Documentation and orders reviewed and attested to.  Penni Homans, MD

## 2016-09-25 NOTE — Patient Instructions (Addendum)
Can add Miralax and or Benefiber once or twice  Put feet above head for 15 minutes Minimize sodium Try the compression stockings.    Hypertension Hypertension, commonly called high blood pressure, is when the force of blood pumping through the arteries is too strong. The arteries are the blood vessels that carry blood from the heart throughout the body. Hypertension forces the heart to work harder to pump blood and may cause arteries to become narrow or stiff. Having untreated or uncontrolled hypertension can cause heart attacks, strokes, kidney disease, and other problems. A blood pressure reading consists of a higher number over a lower number. Ideally, your blood pressure should be below 120/80. The first ("top") number is called the systolic pressure. It is a measure of the pressure in your arteries as your heart beats. The second ("bottom") number is called the diastolic pressure. It is a measure of the pressure in your arteries as the heart relaxes. What are the causes? The cause of this condition is not known. What increases the risk? Some risk factors for high blood pressure are under your control. Others are not. Factors you can change  Smoking.  Having type 2 diabetes mellitus, high cholesterol, or both.  Not getting enough exercise or physical activity.  Being overweight.  Having too much fat, sugar, calories, or salt (sodium) in your diet.  Drinking too much alcohol. Factors that are difficult or impossible to change  Having chronic kidney disease.  Having a family history of high blood pressure.  Age. Risk increases with age.  Race. You may be at higher risk if you are African-American.  Gender. Men are at higher risk than women before age 61. After age 31, women are at higher risk than men.  Having obstructive sleep apnea.  Stress. What are the signs or symptoms? Extremely high blood pressure (hypertensive crisis) may  cause:  Headache.  Anxiety.  Shortness of breath.  Nosebleed.  Nausea and vomiting.  Severe chest pain.  Jerky movements you cannot control (seizures).  How is this diagnosed? This condition is diagnosed by measuring your blood pressure while you are seated, with your arm resting on a surface. The cuff of the blood pressure monitor will be placed directly against the skin of your upper arm at the level of your heart. It should be measured at least twice using the same arm. Certain conditions can cause a difference in blood pressure between your right and left arms. Certain factors can cause blood pressure readings to be lower or higher than normal (elevated) for a short period of time:  When your blood pressure is higher when you are in a health care provider's office than when you are at home, this is called white coat hypertension. Most people with this condition do not need medicines.  When your blood pressure is higher at home than when you are in a health care provider's office, this is called masked hypertension. Most people with this condition may need medicines to control blood pressure.  If you have a high blood pressure reading during one visit or you have normal blood pressure with other risk factors:  You may be asked to return on a different day to have your blood pressure checked again.  You may be asked to monitor your blood pressure at home for 1 week or longer.  If you are diagnosed with hypertension, you may have other blood or imaging tests to help your health care provider understand your overall risk for other conditions.  How is this treated? This condition is treated by making healthy lifestyle changes, such as eating healthy foods, exercising more, and reducing your alcohol intake. Your health care provider may prescribe medicine if lifestyle changes are not enough to get your blood pressure under control, and if:  Your systolic blood pressure is above  130.  Your diastolic blood pressure is above 80.  Your personal target blood pressure may vary depending on your medical conditions, your age, and other factors. Follow these instructions at home: Eating and drinking  Eat a diet that is high in fiber and potassium, and low in sodium, added sugar, and fat. An example eating plan is called the DASH (Dietary Approaches to Stop Hypertension) diet. To eat this way: ? Eat plenty of fresh fruits and vegetables. Try to fill half of your plate at each meal with fruits and vegetables. ? Eat whole grains, such as whole wheat pasta, brown rice, or whole grain bread. Fill about one quarter of your plate with whole grains. ? Eat or drink low-fat dairy products, such as skim milk or low-fat yogurt. ? Avoid fatty cuts of meat, processed or cured meats, and poultry with skin. Fill about one quarter of your plate with lean proteins, such as fish, chicken without skin, beans, eggs, and tofu. ? Avoid premade and processed foods. These tend to be higher in sodium, added sugar, and fat.  Reduce your daily sodium intake. Most people with hypertension should eat less than 1,500 mg of sodium a day.  Limit alcohol intake to no more than 1 drink a day for nonpregnant women and 2 drinks a day for men. One drink equals 12 oz of beer, 5 oz of wine, or 1 oz of hard liquor. Lifestyle  Work with your health care provider to maintain a healthy body weight or to lose weight. Ask what an ideal weight is for you.  Get at least 30 minutes of exercise that causes your heart to beat faster (aerobic exercise) most days of the week. Activities may include walking, swimming, or biking.  Include exercise to strengthen your muscles (resistance exercise), such as pilates or lifting weights, as part of your weekly exercise routine. Try to do these types of exercises for 30 minutes at least 3 days a week.  Do not use any products that contain nicotine or tobacco, such as cigarettes and  e-cigarettes. If you need help quitting, ask your health care provider.  Monitor your blood pressure at home as told by your health care provider.  Keep all follow-up visits as told by your health care provider. This is important. Medicines  Take over-the-counter and prescription medicines only as told by your health care provider. Follow directions carefully. Blood pressure medicines must be taken as prescribed.  Do not skip doses of blood pressure medicine. Doing this puts you at risk for problems and can make the medicine less effective.  Ask your health care provider about side effects or reactions to medicines that you should watch for. Contact a health care provider if:  You think you are having a reaction to a medicine you are taking.  You have headaches that keep coming back (recurring).  You feel dizzy.  You have swelling in your ankles.  You have trouble with your vision. Get help right away if:  You develop a severe headache or confusion.  You have unusual weakness or numbness.  You feel faint.  You have severe pain in your chest or abdomen.  You vomit repeatedly.  You have trouble breathing. Summary  Hypertension is when the force of blood pumping through your arteries is too strong. If this condition is not controlled, it may put you at risk for serious complications.  Your personal target blood pressure may vary depending on your medical conditions, your age, and other factors. For most people, a normal blood pressure is less than 120/80.  Hypertension is treated with lifestyle changes, medicines, or a combination of both. Lifestyle changes include weight loss, eating a healthy, low-sodium diet, exercising more, and limiting alcohol. This information is not intended to replace advice given to you by your health care provider. Make sure you discuss any questions you have with your health care provider. Document Released: 01/23/2005 Document Revised: 12/22/2015  Document Reviewed: 12/22/2015 Elsevier Interactive Patient Education  [employer].

## 2016-09-26 LAB — VITAMIN D 25 HYDROXY (VIT D DEFICIENCY, FRACTURES): VITD: 44.05 ng/mL (ref 30.00–100.00)

## 2016-09-26 LAB — COMPREHENSIVE METABOLIC PANEL
ALT: 19 U/L (ref 0–35)
AST: 19 U/L (ref 0–37)
Albumin: 3.9 g/dL (ref 3.5–5.2)
Alkaline Phosphatase: 55 U/L (ref 39–117)
BILIRUBIN TOTAL: 0.6 mg/dL (ref 0.2–1.2)
BUN: 17 mg/dL (ref 6–23)
CALCIUM: 9.2 mg/dL (ref 8.4–10.5)
CO2: 28 meq/L (ref 19–32)
CREATININE: 0.73 mg/dL (ref 0.40–1.20)
Chloride: 99 mEq/L (ref 96–112)
GFR: 82.5 mL/min (ref >60.00)
Glucose, Bld: 114 mg/dL — ABNORMAL HIGH (ref 70–99)
Potassium: 4.2 mEq/L (ref 3.5–5.1)
SODIUM: 134 meq/L — AB (ref 135–145)
Total Protein: 7.3 g/dL (ref 6.0–8.3)

## 2016-09-26 LAB — MAGNESIUM: Magnesium: 1.7 mg/dL (ref 1.5–2.5)

## 2016-09-27 NOTE — Assessment & Plan Note (Signed)
Increase leafy greens, consider increased lean red meat and using cast iron cookware. Continue to monitor, report any concerns 

## 2016-09-27 NOTE — Assessment & Plan Note (Signed)
Tolerating statin, encouraged heart healthy diet, avoid trans fats, minimize simple carbs and saturated fats. Increase exercise as tolerated 

## 2016-09-27 NOTE — Assessment & Plan Note (Signed)
hgba1c acceptable, minimize simple carbs. Increase exercise as tolerated.  

## 2016-09-27 NOTE — Assessment & Plan Note (Signed)
Avoid offending foods, start probiotics. Do not eat large meals in late evening and consider raising head of bed.  

## 2016-10-24 ENCOUNTER — Ambulatory Visit (INDEPENDENT_AMBULATORY_CARE_PROVIDER_SITE_OTHER): Payer: Medicare Other | Admitting: Family Medicine

## 2016-10-24 ENCOUNTER — Other Ambulatory Visit (INDEPENDENT_AMBULATORY_CARE_PROVIDER_SITE_OTHER): Payer: Medicare Other

## 2016-10-24 DIAGNOSIS — I10 Essential (primary) hypertension: Secondary | ICD-10-CM

## 2016-10-24 LAB — COMPREHENSIVE METABOLIC PANEL
ALT: 23 U/L (ref 0–35)
AST: 18 U/L (ref 0–37)
Albumin: 4.3 g/dL (ref 3.5–5.2)
Alkaline Phosphatase: 55 U/L (ref 39–117)
BUN: 19 mg/dL (ref 6–23)
CO2: 25 meq/L (ref 19–32)
Calcium: 10 mg/dL (ref 8.4–10.5)
Chloride: 97 mEq/L (ref 96–112)
Creatinine, Ser: 0.66 mg/dL (ref 0.40–1.20)
GFR: 92.65 mL/min (ref >60.00)
GLUCOSE: 122 mg/dL — AB (ref 70–99)
Potassium: 4 mEq/L (ref 3.5–5.1)
Sodium: 131 mEq/L — ABNORMAL LOW (ref 135–145)
TOTAL PROTEIN: 7.3 g/dL (ref 6.0–8.3)
Total Bilirubin: 0.8 mg/dL (ref 0.2–1.2)

## 2016-10-24 MED ORDER — IRBESARTAN 150 MG PO TABS: 150.0000 mg | ORAL_TABLET | Freq: Every day | ORAL | 0 refills | Status: DC

## 2016-10-24 MED FILL — IRBESARTAN 150 MG TABLET: 150 | 30 days supply | Qty: 30 | Fill #0

## 2016-10-24 NOTE — Progress Notes (Signed)
Pre visit review using our clinic tool,if applicable. No additional management support is needed unless otherwise documented below in the visit note.  Patient in today for BP check per order from Dr. Charlett Blake dated 09/25/16.  Patient states she feels a little lightheaded today with headaches since she started new BP medication. Patient states she would like to know if she needs labwork and an appointment to see provider sooner that she has scheduled now which is in October.    BP today 128/54 P=60  Per Dr. Charlett Blake patient to have labwork collected today as ordered. Decrease Ibersartan 300 mg to 150 mg daily. Increase fluid intake to stay well hydtrated Will see patient next motnh for follow up BP check.   Medication ordered from local pharmacy 30 day supply per patient.  Nurse blood pressure check note reviewed. Agree with documention and plan.

## 2016-11-13 ENCOUNTER — Ambulatory Visit (HOSPITAL_BASED_OUTPATIENT_CLINIC_OR_DEPARTMENT_OTHER): Payer: Medicare Other | Admitting: Hematology & Oncology

## 2016-11-13 ENCOUNTER — Other Ambulatory Visit (HOSPITAL_BASED_OUTPATIENT_CLINIC_OR_DEPARTMENT_OTHER): Payer: Medicare Other

## 2016-11-13 VITALS — BP 129/59 | HR 65 | Temp 97.8°F | Wt 168.4 lb

## 2016-11-13 DIAGNOSIS — D509 Iron deficiency anemia, unspecified: Secondary | ICD-10-CM | POA: Diagnosis not present

## 2016-11-13 DIAGNOSIS — K909 Intestinal malabsorption, unspecified: Secondary | ICD-10-CM

## 2016-11-13 DIAGNOSIS — D508 Other iron deficiency anemias: Secondary | ICD-10-CM

## 2016-11-13 DIAGNOSIS — D500 Iron deficiency anemia secondary to blood loss (chronic): Secondary | ICD-10-CM

## 2016-11-13 LAB — CBC WITH DIFFERENTIAL (CANCER CENTER ONLY)
BASO#: 0 10*3/uL (ref 0.0–0.2)
BASO%: 0.4 % (ref 0.0–2.0)
EOS ABS: 0.1 10*3/uL (ref 0.0–0.5)
EOS%: 0.9 % (ref 0.0–7.0)
HEMATOCRIT: 37.8 % (ref 34.8–46.6)
HEMOGLOBIN: 13 g/dL (ref 11.6–15.9)
LYMPH#: 2.1 10*3/uL (ref 0.9–3.3)
LYMPH%: 26.3 % (ref 14.0–48.0)
MCH: 29.3 pg (ref 26.0–34.0)
MCHC: 34.4 g/dL (ref 32.0–36.0)
MCV: 85 fL (ref 81–101)
MONO#: 0.8 10*3/uL (ref 0.1–0.9)
MONO%: 9.8 % (ref 0.0–13.0)
NEUT%: 62.6 % (ref 39.6–80.0)
NEUTROS ABS: 5 10*3/uL (ref 1.5–6.5)
Platelets: 215 10*3/uL (ref 145–400)
RBC: 4.43 10*6/uL (ref 3.70–5.32)
RDW: 12.5 % (ref 11.1–15.7)
WBC: 8 10*3/uL (ref 3.9–10.0)

## 2016-11-13 NOTE — Progress Notes (Signed)
Hematology and Oncology Follow Up Visit  [name] [id number] Sep 09, 1940 76 y.o. 11/13/2016   Principle Diagnosis:   Iron deficiency anemia secondary to malabsorption  Current Therapy:    IV iron-patient received a dose back in October 2017     Interim History:  [name] is back for follow-up.  She is doing quite well. She does feel a little bit tired. Patient a wonderful time in [place]. She and her husband went back in June. They were there for 45 days. She has family and he has family who live over there.  She was incredibly kind and probably back some stamps from [place], [place], and [place]. She also gave me a little refrigerator magnet made from [place] from [place].   Actually last saw her in April, her ferritin was 287 with an iron saturation of 27%.  She's had no bleeding. There's been no nausea or vomiting. She's had no rashes. She's had no cough. There's been no weight loss or weight gain.  Overall, her performance status is ECOG 1.  Medications:  Current Outpatient Prescriptions:  .  acetaminophen (TYLENOL) 500 MG tablet, Take 2 tablets (1,000 mg total) by mouth every 6 (six) hours as needed for moderate pain., Disp: 30 tablet, Rfl: 0 .  ALPRAZolam (XANAX) 0.25 MG tablet, Take 1 tablet (0.25 mg total) by mouth 2 (two) times daily as needed for anxiety., Disp: 30 tablet, Rfl: 1 .  amLODipine (NORVASC) 5 MG tablet, TAKE 1 TABLET BY MOUTH  DAILY, Disp: 90 tablet, Rfl: 3 .  aspirin 81 MG tablet, Take 81 mg by mouth daily., Disp: , Rfl:  .  cholecalciferol (VITAMIN D) 1000 UNITS tablet, Take 1,000 Units by mouth daily.  , Disp: , Rfl:  .  dicyclomine (BENTYL) 10 MG capsule, Take 1 capsule (10 mg total) by mouth 4 (four) times daily as needed., Disp: 120 capsule, Rfl: 6 .  esomeprazole (NEXIUM) 40 MG capsule, Take 1 capsule (40 mg total) by mouth daily before breakfast., Disp: 30 capsule, Rfl: 11 .  FOLBIC 2.5-25-2 MG TABS tablet, TAKE 1 TABLET BY MOUTH  EVERY DAY, Disp: 90 tablet, Rfl: 3 .  glucose blood test strip, Check twice daily and as needed.  DX E11.9, Disp: 100 each, Rfl: 3 .  irbesartan (AVAPRO) 150 MG tablet, Take 1 tablet (150 mg total) by mouth daily., Disp: 30 tablet, Rfl: 0 .  irbesartan (AVAPRO) 300 MG tablet, Take 1 tablet (300 mg total) by mouth daily., Disp: 90 tablet, Rfl: 3 .  Lancets MISC, Use twice daily to check blood sugar. DX E11.9, Disp: 200 each, Rfl: 2 .  metFORMIN (GLUCOPHAGE-XR) 500 MG 24 hr tablet, TAKE 1 TABLET BY MOUTH 3  TIMES DAILY WITH MEALS, Disp: 270 tablet, Rfl: 0 .  metoCLOPramide (REGLAN) 5 MG tablet, TAKE 1 TABLET BY MOUTH TWO  TIMES DAILY, Disp: 180 tablet, Rfl: 1 .  metoprolol (LOPRESSOR) 50 MG tablet, Take 75 mg in the morning and 50 mg in the evening, Disp: 225 tablet, Rfl: 3 .  Multiple Vitamin (MULTIVITAMIN) tablet, Take 1 tablet by mouth daily.  , Disp: , Rfl:  .  Multiple Vitamins-Calcium (VIACTIV MULTI-VITAMIN) CHEW, Chew 2 each by mouth daily., Disp: , Rfl:  .  ondansetron (ZOFRAN) 4 MG tablet, Take 4 mg by mouth every 4 (four) hours as needed. For nausea  , Disp: , Rfl:  .  potassium chloride (K-DUR,KLOR-CON) 10 MEQ tablet, TAKE 1 TABLET BY MOUTH  DAILY, Disp: 90 tablet, Rfl: 2 .  rosuvastatin (CRESTOR) 10 MG tablet, TAKE 1 TABLET BY MOUTH  DAILY, Disp: 90 tablet, Rfl: 2  Allergies:  Allergies  Allergen Reactions  . Tramadol     Dizziness     Past Medical History, Surgical history, Social history, and Family History were reviewed and updated.  Review of Systems:  As stated in the interim history  Physical Exam:  weight is 168 lb 6.4 oz (76.4 kg). Her oral temperature is 97.8 F (36.6 C). Her blood pressure is 129/59 (abnormal) and her pulse is 65.   Wt Readings from Last 3 Encounters:  11/13/16 168 lb 6.4 oz (76.4 kg)  09/25/16 169 lb (76.7 kg)  06/15/16 170 lb 12.8 oz (77.5 kg)      well-developed and well-nourished white female. Head and neck exam shows no ocular or oral  lesions. There are no palpable cervical or supraclavicular lymph nodes. Lungs are clear bilaterally. Cardiac exam regular rate and rhythm with no murmurs, rubs or bruits. Abdomen is soft. She has good bowel sounds. There is no fluid wave. There is no palpable liver or spleen tip. Back exam shows no tenderness over the spine, ribs or hips. Extremities shows no clubbing, cyanosis or edema. Skin exam shows no rashes, ecchymoses or petechia. Neurological exam shows no focal neurological deficits.  Lab Results  Component Value Date   WBC 8.0 11/13/2016   HGB 13.0 11/13/2016   HCT 37.8 11/13/2016   MCV 85 11/13/2016   PLT 215 11/13/2016     Chemistry      Component Value Date/Time   NA 131 (L) 10/24/2016 1051   NA 136 05/08/2016 1305   NA 134 (L) 07/08/2015 1149   K 4.0 10/24/2016 1051   K 4.2 05/08/2016 1305   K 4.4 07/08/2015 1149   CL 97 10/24/2016 1051   CL 102 05/08/2016 1305   CO2 25 10/24/2016 1051   CO2 26 05/08/2016 1305   CO2 25 07/08/2015 1149   BUN 19 10/24/2016 1051   BUN 16 05/08/2016 1305   BUN 15.5 07/08/2015 1149   CREATININE 0.66 10/24/2016 1051   CREATININE 0.9 05/08/2016 1305   CREATININE 0.8 07/08/2015 1149      Component Value Date/Time   CALCIUM 10.0 10/24/2016 1051   CALCIUM 9.4 05/08/2016 1305   CALCIUM 10.0 07/08/2015 1149   ALKPHOS 55 10/24/2016 1051   ALKPHOS 56 05/08/2016 1305   ALKPHOS 62 07/08/2015 1149   AST 18 10/24/2016 1051   AST 28 05/08/2016 1305   AST 24 07/08/2015 1149   ALT 23 10/24/2016 1051   ALT 29 05/08/2016 1305   ALT 30 07/08/2015 1149   BILITOT 0.8 10/24/2016 1051   BILITOT 0.90 05/08/2016 1305   BILITOT 0.64 07/08/2015 1149         Impression and Plan: [name] is 76 year old white female. She has iron deficiency. I suspect that this is secondary to malabsorption. She's had stools tested which were negative for blood. I think because of her diabetes, she probably is not absorbing all that well.   We will see what  her iron levels show. It is possible that they might be low. Again, with her diabetes, she probably does not absorb all that well.  As such, we will see her back in another 6 months. We will see what her iron studies show.    Volanda Napoleon, MD 10/8/20185:51 PM.

## 2016-11-14 ENCOUNTER — Telehealth: Payer: Self-pay | Admitting: *Deleted

## 2016-11-14 LAB — IRON AND TIBC
%SAT: 26 % (ref 21–57)
Iron: 81 ug/dL (ref 41–142)
TIBC: 310 ug/dL (ref 236–444)
UIBC: 229 ug/dL (ref 120–384)

## 2016-11-14 LAB — FERRITIN: Ferritin: 287 ng/ml — ABNORMAL HIGH (ref 9–269)

## 2016-11-14 NOTE — Telephone Encounter (Addendum)
Patient is aware of results  ----- Message from Volanda Napoleon, MD sent at 11/14/2016 11:05 AM EDT ----- Call - iron results are great!!  [name]

## 2016-11-17 ENCOUNTER — Other Ambulatory Visit: Payer: Self-pay | Admitting: Cardiology

## 2016-11-17 ENCOUNTER — Other Ambulatory Visit: Payer: Self-pay | Admitting: Family Medicine

## 2016-11-17 NOTE — Telephone Encounter (Signed)
REFILL 

## 2016-11-21 ENCOUNTER — Ambulatory Visit (INDEPENDENT_AMBULATORY_CARE_PROVIDER_SITE_OTHER): Payer: Medicare Other | Admitting: Family Medicine

## 2016-11-21 ENCOUNTER — Encounter: Payer: Self-pay | Admitting: Family Medicine

## 2016-11-21 VITALS — BP 132/60 | HR 64 | Temp 98.3°F | Resp 18 | Wt 171.0 lb

## 2016-11-21 DIAGNOSIS — R319 Hematuria, unspecified: Secondary | ICD-10-CM | POA: Diagnosis not present

## 2016-11-21 DIAGNOSIS — E7800 Pure hypercholesterolemia, unspecified: Secondary | ICD-10-CM | POA: Diagnosis not present

## 2016-11-21 DIAGNOSIS — K5900 Constipation, unspecified: Secondary | ICD-10-CM | POA: Diagnosis not present

## 2016-11-21 DIAGNOSIS — I10 Essential (primary) hypertension: Secondary | ICD-10-CM

## 2016-11-21 DIAGNOSIS — E669 Obesity, unspecified: Secondary | ICD-10-CM | POA: Diagnosis not present

## 2016-11-21 DIAGNOSIS — Z23 Encounter for immunization: Secondary | ICD-10-CM

## 2016-11-21 DIAGNOSIS — R109 Unspecified abdominal pain: Secondary | ICD-10-CM

## 2016-11-21 DIAGNOSIS — E559 Vitamin D deficiency, unspecified: Secondary | ICD-10-CM | POA: Diagnosis not present

## 2016-11-21 DIAGNOSIS — E1169 Type 2 diabetes mellitus with other specified complication: Secondary | ICD-10-CM | POA: Diagnosis not present

## 2016-11-21 DIAGNOSIS — E871 Hypo-osmolality and hyponatremia: Secondary | ICD-10-CM | POA: Diagnosis not present

## 2016-11-21 DIAGNOSIS — R42 Dizziness and giddiness: Secondary | ICD-10-CM

## 2016-11-21 HISTORY — DX: Constipation, unspecified: K59.00

## 2016-11-21 LAB — URINALYSIS
BILIRUBIN URINE: NEGATIVE
KETONES UR: NEGATIVE
LEUKOCYTES UA: NEGATIVE
Nitrite: NEGATIVE
Specific Gravity, Urine: <=1.005 — AB (ref 1.000–1.030)
Total Protein, Urine: NEGATIVE
UROBILINOGEN UA: 0.2 (ref 0.0–1.0)
Urine Glucose: NEGATIVE
pH: 5.5 (ref 5.0–8.0)

## 2016-11-21 LAB — CBC WITH DIFFERENTIAL/PLATELET
BASOS PCT: 0.9 % (ref 0.0–3.0)
Basophils Absolute: 0.1 10*3/uL (ref 0.0–0.1)
EOS ABS: 0.1 10*3/uL (ref 0.0–0.7)
Eosinophils Relative: 0.9 % (ref 0.0–5.0)
HEMATOCRIT: 39.3 % (ref 36.0–46.0)
Hemoglobin: 13.1 g/dL (ref 12.0–15.0)
LYMPHS PCT: 27.2 % (ref 12.0–46.0)
Lymphs Abs: 2 10*3/uL (ref 0.7–4.0)
MCHC: 33.3 g/dL (ref 30.0–36.0)
MCV: 86.9 fl (ref 78.0–100.0)
MONO ABS: 0.6 10*3/uL (ref 0.1–1.0)
Monocytes Relative: 8.8 % (ref 3.0–12.0)
NEUTROS ABS: 4.5 10*3/uL (ref 1.4–7.7)
Neutrophils Relative %: 62.2 % (ref 43.0–77.0)
PLATELETS: 206 10*3/uL (ref 150.0–400.0)
RBC: 4.53 Mil/uL (ref 3.87–5.11)
RDW: 13.4 % (ref 11.5–15.5)
WBC: 7.3 10*3/uL (ref 4.0–10.5)

## 2016-11-21 LAB — COMPREHENSIVE METABOLIC PANEL
ALT: 26 U/L (ref 0–35)
AST: 21 U/L (ref 0–37)
Albumin: 4.2 g/dL (ref 3.5–5.2)
Alkaline Phosphatase: 53 U/L (ref 39–117)
BUN: 19 mg/dL (ref 6–23)
CHLORIDE: 100 meq/L (ref 96–112)
CO2: 26 meq/L (ref 19–32)
CREATININE: 0.66 mg/dL (ref 0.40–1.20)
Calcium: 9.8 mg/dL (ref 8.4–10.5)
GFR: 92.63 mL/min (ref >60.00)
GLUCOSE: 106 mg/dL — AB (ref 70–99)
Potassium: 4 mEq/L (ref 3.5–5.1)
SODIUM: 135 meq/L (ref 135–145)
Total Bilirubin: 0.5 mg/dL (ref 0.2–1.2)
Total Protein: 7.2 g/dL (ref 6.0–8.3)

## 2016-11-21 MED ORDER — IRBESARTAN 150 MG PO TABS: 150.0000 mg | ORAL_TABLET | Freq: Every day | ORAL | 2 refills | Status: DC

## 2016-11-21 MED FILL — IRBESARTAN 150 MG TABLET: 150 | 30 days supply | Qty: 30 | Fill #0

## 2016-11-21 NOTE — Assessment & Plan Note (Signed)
Patient complaining of intermittent light headedness and dizziness for roughly 2 months. She denies it correlates with any med changes. Just an overall sense of malaise. Recheck cmp today and if worsens will need further work up

## 2016-11-21 NOTE — Assessment & Plan Note (Signed)
Check cmp today 

## 2016-11-21 NOTE — Patient Instructions (Signed)
Encouraged increased hydration and fiber in diet. Daily probiotics. If bowels not moving can use MOM 2 tbls po in 4 oz of warm prune juice by mouth every 2-3 days. If no results then repeat in 4 hours with  Dulcolax suppository pr, may repeat again in 4 more hours as needed. Seek care if symptoms worsen. Consider daily Miralax and/or Dulcolax if symptoms persist.   Once you get cleared out cleaned  Miralax and benefiber mixed together once or twice a day to see if that helps your constipation and abdominal pain and if it does not let me know so we can consider imaging

## 2016-11-21 NOTE — Assessment & Plan Note (Signed)
Noted, patient notes was worked up in [place] by a urologist. 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

## 2016-11-21 NOTE — Assessment & Plan Note (Signed)
Recent increase in RUQ abdominal intemrittent over past week. declines AXR but will call if worsens. Will work on getting bowels moving with Encouraged increased hydration and fiber in diet. Daily probiotics. If bowels not moving can use MOM 2 tbls po in 4 oz of warm prune juice by mouth every 2-3 days. If no results then repeat in 4 hours with  Dulcolax suppository pr, may repeat again in 4 more hours as needed. Seek care if symptoms worsen. Consider daily Miralax and/or Dulcolax if symptoms persist. Miralax and benefiber once and twice.

## 2016-11-21 NOTE — Assessment & Plan Note (Signed)
Check vitamin D with next blood.

## 2016-11-21 NOTE — Progress Notes (Signed)
Subjective:  I acted as a Education administrator for Dr. Charlett Blake. [name], [place]  [name], female    DOB: March 29, 1940, 76 y.o.   MRN: [id number]  No chief complaint on file.   HPI  Patient is in today for a 4 week follow up. She continues to note a general sense of malaise and weakness. Has a sense of headache and dizziness upon awakening most days. Has episodes of feeling light headed during most days. Headaches are most notable in am but she denies snoring or waking up choking. No recent fevers or chills. Had an episode or significant RUQ pain last week, has been having more rouble with constipation lately. Moving bowels every few days with small hard bowel movements. No bloody or tarry stool. No nausea or vomiting. Reglan helps the pain but then it returns. Denies CP/palp/SOB/HA/congestion/fevers or GU c/o. Taking meds as prescribed  Patient Care Team: [name], MD as PCP - General (Family Medicine) Gatha Mayer, MD as Consulting Physician (Gastroenterology) Love, Alyson Locket, MD (Neurology) Minus Breeding, MD as Attending Physician (Cardiology)   Past Medical History:  Diagnosis Date  . Abdominal pain in female 03/18/2010   Qualifier: Diagnosis of  By: Carlean Purl MD, Dimas Millin Anemia 06/08/2014  . Anxiety   . Arthritis    Spinal Osteoarthritis  . Cancer ([place])   . Carcinoid tumor of stomach   . Cataract   . Chest pain    Myoview 12/15 no ischemia.  . Chronic kidney disease    Left kidney smaller than right kidney  . Diabetes mellitus type 2 in obese ([place]) 09/05/2006   Qualifier: Diagnosis of  By: Marca Ancona RMA, Lucy    . Diabetic peripheral neuropathy ([place]) 10/29/2013  . Diverticulosis 08/30/2000   Colonoscopy   . Encounter for preventative adult health care exam with abnormal findings 09/14/2013  . Esophageal reflux   . Gastric polyp    Fundic Gland  . Gastroparesis   . Headache(784.0)   . Heart murmur    Echocardiogram 2/11: EF 60-65%, mild LAE, grade 1 diastolic  dysfunction, aortic valve sclerosis, mean gradient 9 mm of mercury, PASP 34  . Hematuria 03/16/2016  . Iron deficiency anemia, unspecified   . 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 Leg swelling    bilateral  . Neck pain 04/22/2015  . PONV (postoperative nausea and vomiting)    pt states only needs small amount of anesthesia  . PSVT (paroxysmal supraventricular tachycardia) ([place])   . Pure hypercholesterolemia   . 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 Stroke ([place])    tia, 2014  . 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 Type II or unspecified type diabetes mellitus without mention of complication, not stated as uncontrolled   . Unspecified essential hypertension   . Unspecified hereditary and idiopathic peripheral neuropathy 10/29/2013    Past Surgical History:  Procedure Laterality Date  . CHOLECYSTECTOMY  1993  . COLONOSCOPY  11/11/2010   diverticulosis  . DILATATION & CURRETTAGE/HYSTEROSCOPY WITH RESECTOCOPE N/A 02/25/2013   Procedure: Attempted hysteroscopy with uterine perforation;  Surgeon: Jamey Reas de Berton Lan, MD;  Location: [facility];  Service: Gynecology;  Laterality: N/A;  . ESOPHAGOGASTRODUODENOSCOPY  08/29/2010; 09/15/2010   Carcinoid tumor less than 1 cm in July 2012 not seen in August 2012 , gastritis, fundic gland polyps  . ESOPHAGOGASTRODUODENOSCOPY  05/16/2011  . ESOPHAGOGASTRODUODENOSCOPY  06/14/2012  . EUS  12/15/2010   Procedure: UPPER ENDOSCOPIC ULTRASOUND (EUS) LINEAR;  Surgeon: Owens Loffler,  MD;  Location: [facility];  Service: Endoscopy;  Laterality: N/A;  . EYE SURGERY Bilateral    Bi lateral cateracts and bi lateral laser  . LAPAROSCOPY N/A 02/25/2013   Procedure: Cystoscopy and laparoscopy with fulguration of uterine serosa;  Surgeon: Jamey Reas de Berton Lan, MD;  Location: [facility];  Service: Gynecology;  Laterality: N/A;  . TONSILLECTOMY      Family History  Problem Relation Age of Onset  . Diabetes Mother   . Stroke Father        deceased age 41  . Heart disease  Sister        deceased MI age 73  . Heart disease Brother        deceased MI age 21  . Diabetes Maternal Grandmother   . Hypertension Paternal Grandmother   . Diabetes Sister   . Heart disease Sister   . Hypertension Sister   . Hyperlipidemia Sister   . Diabetes Sister   . Heart disease Sister   . Hypertension Sister   . Hyperlipidemia Sister   . Diabetes Brother   . Heart disease Brother   . Hypertension Brother   . Hyperlipidemia Brother   . Colon cancer Neg Hx   . Esophageal cancer Neg Hx   . Stomach cancer Neg Hx   . Rectal cancer Neg Hx     Social History   Social History  . Marital status: Married    Spouse name: N/A  . Number of children: 0  . Years of education: college   Occupational History  . Retail    Social History Main Topics  . Smoking status: Never Smoker  . Smokeless tobacco: Never Used     Comment: Never used tobacco  . Alcohol use No  . Drug use: No  . Sexual activity: No     Comment: lives alone, no dietary restrictions except avoid fresh veg, fruit, whole grains   Other Topics Concern  . Not on file   Social History Narrative   Patient was married ([name]) - widow   Patient does not have any children.   Patient is right-handed.   Patient has a BA degree.   One caffeine drink daily     Outpatient Medications Prior to Visit  Medication Sig Dispense Refill  . acetaminophen (TYLENOL) 500 MG tablet Take 2 tablets (1,000 mg total) by mouth every 6 (six) hours as needed for moderate pain. 30 tablet 0  . ALPRAZolam (XANAX) 0.25 MG tablet Take 1 tablet (0.25 mg total) by mouth 2 (two) times daily as needed for anxiety. 30 tablet 1  . amLODipine (NORVASC) 5 MG tablet TAKE 1 TABLET BY MOUTH  DAILY 90 tablet 3  . aspirin 81 MG tablet Take 81 mg by mouth daily.    . cholecalciferol (VITAMIN D) 1000 UNITS tablet Take 1,000 Units by mouth daily.      Marland Kitchen dicyclomine (BENTYL) 10 MG capsule Take 1 capsule (10 mg total) by mouth 4 (four) times daily as  needed. 120 capsule 6  . esomeprazole (NEXIUM) 40 MG capsule Take 1 capsule (40 mg total) by mouth daily before breakfast. 30 capsule 11  . FOLBIC 2.5-25-2 MG TABS tablet TAKE 1 TABLET BY MOUTH EVERY DAY 90 tablet 3  . glucose blood test strip Check twice daily and as needed.  DX E11.9 100 each 3  . Lancets MISC Use twice daily to check blood sugar. DX E11.9 200 each 2  . metFORMIN (GLUCOPHAGE-XR) 500 MG 24  hr tablet TAKE 1 TABLET BY MOUTH 3  TIMES DAILY WITH MEALS 270 tablet 0  . metoCLOPramide (REGLAN) 5 MG tablet TAKE 1 TABLET BY MOUTH TWO  TIMES DAILY 180 tablet 1  . Multiple Vitamin (MULTIVITAMIN) tablet Take 1 tablet by mouth daily.      . Multiple Vitamins-Calcium (VIACTIV MULTI-VITAMIN) CHEW Chew 2 each by mouth daily.    . ondansetron (ZOFRAN) 4 MG tablet Take 4 mg by mouth every 4 (four) hours as needed. For nausea      . potassium chloride (K-DUR,KLOR-CON) 10 MEQ tablet TAKE 1 TABLET BY MOUTH  DAILY 90 tablet 2  . rosuvastatin (CRESTOR) 10 MG tablet TAKE 1 TABLET BY MOUTH  DAILY 90 tablet 2  . irbesartan (AVAPRO) 150 MG tablet Take 1 tablet (150 mg total) by mouth daily. 30 tablet 0  . irbesartan (AVAPRO) 300 MG tablet Take 1 tablet (300 mg total) by mouth daily. 90 tablet 3  . metoprolol tartrate (LOPRESSOR) 50 MG tablet TAKE 1 AND 1/2 TABLETS BY  MOUTH IN THE MORNING AND 1  TABLET BY MOUTH IN THE  EVENING 225 tablet 1   No facility-administered medications prior to visit.     Allergies  Allergen Reactions  . Tramadol     Dizziness     Review of Systems  Constitutional: Negative for fever and malaise/fatigue.  HENT: Negative for congestion.   Eyes: Negative for blurred vision.  Respiratory: Negative for cough and shortness of breath.   Cardiovascular: Negative for chest pain, palpitations and leg swelling.  Gastrointestinal: Positive for constipation. Negative for vomiting.  Musculoskeletal: Negative for back pain.  Skin: Negative for rash.  Neurological: Positive for  dizziness and headaches. Negative for loss of consciousness.       Objective:    Physical Exam  Constitutional: She is oriented to person, place, and time. She appears well-developed and well-nourished. No distress.  HENT:  Head: Normocephalic and atraumatic.  Eyes: Conjunctivae are normal.  Neck: Normal range of motion. No thyromegaly present.  Cardiovascular: Normal rate and regular rhythm.   Pulmonary/Chest: Effort normal and breath sounds normal. She has no wheezes.  Abdominal: Soft. Bowel sounds are normal. There is no tenderness.  Musculoskeletal: Normal range of motion. She exhibits no edema or deformity.  Neurological: She is alert and oriented to person, place, and time.  Skin: Skin is warm and dry. She is not diaphoretic.  Psychiatric: She has a normal mood and affect.    BP 132/60 (BP Location: Left Arm, Patient Position: Sitting, Cuff Size: Normal)   Pulse 64   Temp 98.3 F (36.8 C) (Oral)   Resp 18   Wt 171 lb (77.6 kg)   LMP 02/07/1992   SpO2 98%   BMI 32.31 kg/m  Wt Readings from Last 3 Encounters:  11/21/16 171 lb (77.6 kg)  11/13/16 168 lb 6.4 oz (76.4 kg)  09/25/16 169 lb (76.7 kg)   BP Readings from Last 3 Encounters:  11/21/16 132/60  11/13/16 (!) 129/59  09/25/16 (!) 154/61     Immunization History  Administered Date(s) Administered  . Influenza Split 10/31/2010, 11/28/2011  . Influenza Whole 11/19/2007, 11/04/2008, 11/10/2009  . Influenza, High Dose Seasonal PF 11/11/2015, 11/21/2016  . Influenza,inj,Quad PF,6+ Mos 01/09/2013, 10/20/2013, 11/13/2014  . Pneumococcal Conjugate-13 09/11/2013  . Pneumococcal Polysaccharide-23 03/13/2007  . Td 03/02/2005    Health Maintenance  Topic Date Due  . TETANUS/TDAP  03/03/2015  . FOOT EXAM  11/13/2015  . INFLUENZA VACCINE  09/06/2016  .  OPHTHALMOLOGY EXAM  02/06/2017  . HEMOGLOBIN A1C  03/21/2017  . COLONOSCOPY  11/10/2020  . DEXA SCAN  Completed  . PNA vac Low Risk Adult  Completed    Lab  Results  Component Value Date   WBC 8.0 11/13/2016   HGB 13.0 11/13/2016   HCT 37.8 11/13/2016   PLT 215 11/13/2016   GLUCOSE 122 (H) 10/24/2016   CHOL 158 09/18/2016   TRIG 111.0 09/18/2016   HDL 56.00 09/18/2016   LDLDIRECT 123.7 04/13/2011   LDLCALC 79 09/18/2016   ALT 23 10/24/2016   AST 18 10/24/2016   NA 131 (L) 10/24/2016   K 4.0 10/24/2016   CL 97 10/24/2016   CREATININE 0.66 10/24/2016   BUN 19 10/24/2016   CO2 25 10/24/2016   TSH 2.19 09/18/2016   INR 0.88 01/16/2011   HGBA1C 6.4 09/18/2016   MICROALBUR 1.6 11/13/2014    Lab Results  Component Value Date   TSH 2.19 09/18/2016   Lab Results  Component Value Date   WBC 8.0 11/13/2016   HGB 13.0 11/13/2016   HCT 37.8 11/13/2016   MCV 85 11/13/2016   PLT 215 11/13/2016   Lab Results  Component Value Date   NA 131 (L) 10/24/2016   K 4.0 10/24/2016   CHLORIDE 101 07/08/2015   CO2 25 10/24/2016   GLUCOSE 122 (H) 10/24/2016   BUN 19 10/24/2016   CREATININE 0.66 10/24/2016   BILITOT 0.8 10/24/2016   ALKPHOS 55 10/24/2016   AST 18 10/24/2016   ALT 23 10/24/2016   PROT 7.3 10/24/2016   ALBUMIN 4.3 10/24/2016   CALCIUM 10.0 10/24/2016   ANIONGAP 8 07/08/2015   EGFR 75 (L) 07/08/2015   GFR 92.65 10/24/2016   Lab Results  Component Value Date   CHOL 158 09/18/2016   Lab Results  Component Value Date   HDL 56.00 09/18/2016   Lab Results  Component Value Date   LDLCALC 79 09/18/2016   Lab Results  Component Value Date   TRIG 111.0 09/18/2016   Lab Results  Component Value Date   CHOLHDL 3 09/18/2016   Lab Results  Component Value Date   HGBA1C 6.4 09/18/2016         Assessment & Plan:   Problem List Items Addressed This Visit    Diabetes mellitus type 2 in obese (HCC)    hgba1c acceptable, minimize simple carbs. Increase exercise as tolerated. Continue current meds, sugar 105 today      Relevant Medications   irbesartan (AVAPRO) 150 MG tablet   Other Relevant Orders    Hemoglobin A1c   HYPERCHOLESTEROLEMIA    Encouraged heart healthy diet, increase exercise, avoid trans fats, consider a krill oil cap daily      Relevant Medications   metoprolol tartrate (LOPRESSOR) 50 MG tablet   irbesartan (AVAPRO) 150 MG tablet   Other Relevant Orders   Lipid panel   Essential hypertension    Improved on recheck but patient notes elevated in am upon arising to 150-160s over 90s. Will increase Metoprolol to 1.5 tabs twice daily for the week til she sees cardiology. Keep the Irbesatran to 150 mg daily and the Amlodipine 5 mg daily      Relevant Medications   metoprolol tartrate (LOPRESSOR) 50 MG tablet   irbesartan (AVAPRO) 150 MG tablet   Other Relevant Orders   Comprehensive metabolic panel   TSH   Abdominal pain in female    Recent increase in RUQ abdominal intemrittent over past week. declines  AXR but will call if worsens. Will work on getting bowels moving with Encouraged increased hydration and fiber in diet. Daily probiotics. If bowels not moving can use MOM 2 tbls po in 4 oz of warm prune juice by mouth every 2-3 days. If no results then repeat in 4 hours with  Dulcolax suppository pr, may repeat again in 4 more hours as needed. Seek care if symptoms worsen. Consider daily Miralax and/or Dulcolax if symptoms persist. Miralax and benefiber once and twice.       Relevant Orders   CBC with Differential/Platelet   CBC with Differential/Platelet   Vitamin D deficiency    Check vitamin D with next blood.      Relevant Orders   VITAMIN D 25 Hydroxy (Vit-D Deficiency, Fractures)   Hyponatremia    Check cmp today      Relevant Orders   Comprehensive metabolic panel   Hematuria    Noted, patient notes was worked up in [place] by a urologist. She reports being told her left kidney was small and not functioning fully and they decided that caused the hematuria      Relevant Orders   Urinalysis   Urine Culture   Dizzy spells    Patient complaining of  intermittent light headedness and dizziness for roughly 2 months. She denies it correlates with any med changes. Just an overall sense of malaise. Recheck cmp today and if worsens will need further work up       Other Visit Diagnoses    Needs flu shot    -  Primary   Relevant Orders   Flu vaccine HIGH DOSE PF (Fluzone High dose) (Completed)      I have changed Ms. Carbon's metoprolol tartrate. I am also having her maintain her multivitamin, cholecalciferol, ondansetron, acetaminophen, aspirin, esomeprazole, metoCLOPramide, dicyclomine, Lancets, glucose blood, FOLBIC, VIACTIV MULTI-VITAMIN, potassium chloride, rosuvastatin, ALPRAZolam, amLODipine, metFORMIN, and irbesartan.  Meds ordered this encounter  Medications  . metoprolol tartrate (LOPRESSOR) 50 MG tablet    Sig: Take 1.5 tablets (75 mg total) by mouth 2 (two) times daily.    Dispense:  225 tablet    Refill:  1  . irbesartan (AVAPRO) 150 MG tablet    Sig: Take 1 tablet (150 mg total) by mouth daily.    Dispense:  30 tablet    Refill:  2    CMA served as scribe during this visit. History, Physical and Plan performed by medical provider. Documentation and orders reviewed and attested to.  Penni Homans, MD

## 2016-11-21 NOTE — Assessment & Plan Note (Signed)
hgba1c acceptable, minimize simple carbs. Increase exercise as tolerated. Continue current meds, sugar 105 today

## 2016-11-21 NOTE — Assessment & Plan Note (Signed)
Encouraged heart healthy diet, increase exercise, avoid trans fats, consider a krill oil cap daily 

## 2016-11-21 NOTE — Assessment & Plan Note (Signed)
Encouraged increased hydration and fiber in diet. Daily probiotics. If bowels not moving can use MOM 2 tbls po in 4 oz of warm prune juice by mouth every 2-3 days. If no results then repeat in 4 hours with  Dulcolax suppository pr, may repeat again in 4 more hours as needed. Seek care if symptoms worsen. Consider daily Miralax and/or Dulcolax if symptoms persist. Try Miralax mixed with Benefiber together once or twice daily

## 2016-11-21 NOTE — Assessment & Plan Note (Signed)
Improved on recheck but patient notes elevated in am upon arising to 150-160s over 90s. Will increase Metoprolol to 1.5 tabs twice daily for the week til she sees cardiology. Keep the Irbesatran to 150 mg daily and the Amlodipine 5 mg daily

## 2016-11-22 LAB — URINE CULTURE
MICRO NUMBER:: 81152776
SPECIMEN QUALITY: ADEQUATE

## 2016-11-29 NOTE — Progress Notes (Deleted)
HPI The patient presents for followup of SVT.     Since I last saw her ***     she has had some isolated palpitations but no sustained episodes.  She is limited by back pain and his having physical therapy for this. She's not had any presyncope or syncope. She's had no chest pressure, neck or arm discomfort. Unfortunately she limited in her activity because of the back.  She seems finally to be improving since the death of her husband a couple of years ago.     Allergies  Allergen Reactions  . Tramadol     Dizziness     Current Outpatient Prescriptions  Medication Sig Dispense Refill  . acetaminophen (TYLENOL) 500 MG tablet Take 2 tablets (1,000 mg total) by mouth every 6 (six) hours as needed for moderate pain. 30 tablet 0  . ALPRAZolam (XANAX) 0.25 MG tablet Take 1 tablet (0.25 mg total) by mouth 2 (two) times daily as needed for anxiety. 30 tablet 1  . amLODipine (NORVASC) 5 MG tablet TAKE 1 TABLET BY MOUTH  DAILY 90 tablet 3  . aspirin 81 MG tablet Take 81 mg by mouth daily.    . cholecalciferol (VITAMIN D) 1000 UNITS tablet Take 1,000 Units by mouth daily.      Marland Kitchen dicyclomine (BENTYL) 10 MG capsule Take 1 capsule (10 mg total) by mouth 4 (four) times daily as needed. 120 capsule 6  . esomeprazole (NEXIUM) 40 MG capsule Take 1 capsule (40 mg total) by mouth daily before breakfast. 30 capsule 11  . FOLBIC 2.5-25-2 MG TABS tablet TAKE 1 TABLET BY MOUTH EVERY DAY 90 tablet 3  . glucose blood test strip Check twice daily and as needed.  DX E11.9 100 each 3  . irbesartan (AVAPRO) 150 MG tablet Take 1 tablet (150 mg total) by mouth daily. 30 tablet 2  . Lancets MISC Use twice daily to check blood sugar. DX E11.9 200 each 2  . metFORMIN (GLUCOPHAGE-XR) 500 MG 24 hr tablet TAKE 1 TABLET BY MOUTH 3  TIMES DAILY WITH MEALS 270 tablet 0  . metoCLOPramide (REGLAN) 5 MG tablet TAKE 1 TABLET BY MOUTH TWO  TIMES DAILY 180 tablet 1  . metoprolol tartrate (LOPRESSOR) 50 MG tablet Take 1.5 tablets  (75 mg total) by mouth 2 (two) times daily. 225 tablet 1  . Multiple Vitamin (MULTIVITAMIN) tablet Take 1 tablet by mouth daily.      . Multiple Vitamins-Calcium (VIACTIV MULTI-VITAMIN) CHEW Chew 2 each by mouth daily.    . ondansetron (ZOFRAN) 4 MG tablet Take 4 mg by mouth every 4 (four) hours as needed. For nausea      . potassium chloride (K-DUR,KLOR-CON) 10 MEQ tablet TAKE 1 TABLET BY MOUTH  DAILY 90 tablet 2  . rosuvastatin (CRESTOR) 10 MG tablet TAKE 1 TABLET BY MOUTH  DAILY 90 tablet 2   No current facility-administered medications for this visit.     Past Medical History:  Diagnosis Date  . Abdominal pain in female 03/18/2010   Qualifier: Diagnosis of  By: Carlean Purl MD, Dimas Millin Anemia 06/08/2014  . Anxiety   . Arthritis    Spinal Osteoarthritis  . Cancer ([place])   . Carcinoid tumor of stomach   . Cataract   . Chest pain    Myoview 12/15 no ischemia.  . Chronic kidney disease    Left kidney smaller than right kidney  . Constipation 11/21/2016  . Diabetes mellitus type 2 in obese (  [place]) 09/05/2006   Qualifier: Diagnosis of  By: Reatha Armour, Lucy    . Diabetic peripheral neuropathy ([place]) 10/29/2013  . Diverticulosis 08/30/2000   Colonoscopy   . Encounter for preventative adult health care exam with abnormal findings 09/14/2013  . Esophageal reflux   . Gastric polyp    Fundic Gland  . Gastroparesis   . Headache(784.0)   . Heart murmur    Echocardiogram 2/11: EF 60-65%, mild LAE, grade 1 diastolic dysfunction, aortic valve sclerosis, mean gradient 9 mm of mercury, PASP 34  . Hematuria 03/16/2016  . Iron deficiency anemia, unspecified   . 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 Leg swelling    bilateral  . Neck pain 04/22/2015  . PONV (postoperative nausea and vomiting)    pt states only needs small amount of anesthesia  . PSVT (paroxysmal supraventricular tachycardia) ([place])   . Pure hypercholesterolemia   . 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 Stroke ([place])    tia, 2014  . 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 Type II or unspecified type diabetes mellitus without mention of complication, not stated as uncontrolled   . Unspecified essential hypertension   . Unspecified hereditary and idiopathic peripheral neuropathy 10/29/2013    Past Surgical History:  Procedure Laterality Date  . CHOLECYSTECTOMY  1993  . COLONOSCOPY  11/11/2010   diverticulosis  . DILATATION & CURRETTAGE/HYSTEROSCOPY WITH RESECTOCOPE N/A 02/25/2013   Procedure: Attempted hysteroscopy with uterine perforation;  Surgeon: Jamey Reas de Berton Lan, MD;  Location: [facility];  Service: Gynecology;  Laterality: N/A;  . ESOPHAGOGASTRODUODENOSCOPY  08/29/2010; 09/15/2010   Carcinoid tumor less than 1 cm in July 2012 not seen in August 2012 , gastritis, fundic gland polyps  . ESOPHAGOGASTRODUODENOSCOPY  05/16/2011  . ESOPHAGOGASTRODUODENOSCOPY  06/14/2012  . EUS  12/15/2010   Procedure: UPPER ENDOSCOPIC ULTRASOUND (EUS) LINEAR;  Surgeon: Owens Loffler, MD;  Location: [facility];  Service: Endoscopy;  Laterality: N/A;  . EYE SURGERY Bilateral    Bi lateral cateracts and bi lateral laser  . LAPAROSCOPY N/A 02/25/2013   Procedure: Cystoscopy and laparoscopy with fulguration of uterine serosa;  Surgeon: Jamey Reas de Berton Lan, MD;  Location: [facility];  Service: Gynecology;  Laterality: N/A;  . TONSILLECTOMY      ROS:   ***  PHYSICAL EXAM LMP 02/07/1992   GENERAL:  Well appearing NECK:  No jugular venous distention, waveform within normal limits, carotid upstroke brisk and symmetric, no bruits, no thyromegaly LUNGS:  Clear to auscultation bilaterally CHEST:  Unremarkable HEART:  PMI not displaced or sustained,S1 and S2 within normal limits, no S3, no S4, no clicks, no rubs, *** murmurs ABD:  Flat, positive bowel sounds normal in frequency in pitch, no bruits, no rebound, no guarding, no midline pulsatile mass, no hepatomegaly, no splenomegaly EXT:  2 plus pulses throughout, no edema, no cyanosis no clubbing    GENERAL:  Well appearing, and in no distress.  NECK:  No jugular venous distention, waveform within normal limits, carotid upstroke brisk and symmetric, no bruits, no thyromegaly LUNGS:  Clear to auscultation bilaterally BACK:  No CVA tenderness CHEST:  Unremarkable HEART:  PMI not displaced or sustained,S1 and S2 within normal limits, no S3, no S4, no clicks, no rubs, slight systolic murmur peaking and heard best at the right upper sternal border, no diastolic murmurs.  Unchanged from previous exam.   ABD:  Flat, positive bowel sounds normal in frequency in pitch, no bruits, no rebound, no guarding, no midline pulsatile mass,  no hepatomegaly, no splenomegaly EXT:  2 plus pulses throughout, mild bilateral ankle edema, no cyanosis no clubbing  Lab Results  Component Value Date   CHOL 158 09/18/2016   TRIG 111.0 09/18/2016   HDL 56.00 09/18/2016   LDLCALC 79 09/18/2016   LDLDIRECT 123.7 04/13/2011     Lab Results  Component Value Date   HGBA1C 6.4 09/18/2016     ASSESSMENT AND PLAN  Supraventricular Tachycardia - ***  She has some palpitations but no SVT.  No change in therapy is planned.   Hypertension - ***  The blood pressure is elevated in the office but she says that it is not elevated at home. No change in medications is indicated. We will continue with therapeutic lifestyle changes (TLC).  Dyslipidemia -  ***  She was not at a target LDL and recently had the dose increased.  I will defer to [name], MD    Carotid stenosis - ***  She has some mild plaque in September of 2016.  No follow up is indicated.   Murmur - This is mild aortic stenosis. ***  We will follow this clinically.  Aortic atherosclerosis - ***  I will pursue aggressive risk reduction.

## 2016-11-30 ENCOUNTER — Ambulatory Visit: Payer: Medicare Other | Admitting: Cardiology

## 2016-11-30 ENCOUNTER — Encounter: Payer: Self-pay | Admitting: Cardiology

## 2016-11-30 ENCOUNTER — Encounter: Payer: Self-pay | Admitting: *Deleted

## 2016-11-30 ENCOUNTER — Ambulatory Visit (INDEPENDENT_AMBULATORY_CARE_PROVIDER_SITE_OTHER): Payer: Medicare Other | Admitting: Cardiology

## 2016-11-30 VITALS — BP 162/78 | HR 68 | Ht 61.0 in | Wt 169.4 lb

## 2016-11-30 DIAGNOSIS — I10 Essential (primary) hypertension: Secondary | ICD-10-CM

## 2016-11-30 DIAGNOSIS — I700 Atherosclerosis of aorta: Secondary | ICD-10-CM

## 2016-11-30 DIAGNOSIS — I471 Supraventricular tachycardia: Secondary | ICD-10-CM | POA: Diagnosis not present

## 2016-11-30 DIAGNOSIS — I350 Nonrheumatic aortic (valve) stenosis: Secondary | ICD-10-CM

## 2016-11-30 MED ORDER — METOPROLOL TARTRATE 50 MG PO TABS: 75.0000 mg | ORAL_TABLET | Freq: Two times a day (BID) | ORAL | 2 refills | Status: DC

## 2016-11-30 NOTE — Progress Notes (Signed)
HPI The patient presents for followup of SVT.     Since I last saw her she has done relatively well. Her blood pressures were running low and she was dizzy so her Avapro was reduced to 150 mg daily. Now her blood pressures are running higher with systolics in the morning typically in the 150s to 160s. It's a little bit lower in the afternoon. She gets occasionally dizzy but it's not necessarily related to her blood pressure. She has some rare palpitations that seem to go away with cough but she's had nothing sustained. She's not had any presyncope or syncope. She denies any chest pressure, neck or arm discomfort. She has some back problems and thus stretching but otherwise doesn't do any exercise. She's not having weight gain or edema.   Allergies  Allergen Reactions  . Tramadol     Dizziness     Current Outpatient Prescriptions  Medication Sig Dispense Refill  . acetaminophen (TYLENOL) 500 MG tablet Take 2 tablets (1,000 mg total) by mouth every 6 (six) hours as needed for moderate pain. 30 tablet 0  . ALPRAZolam (XANAX) 0.25 MG tablet Take 1 tablet (0.25 mg total) by mouth 2 (two) times daily as needed for anxiety. 30 tablet 1  . amLODipine (NORVASC) 5 MG tablet TAKE 1 TABLET BY MOUTH  DAILY 90 tablet 3  . aspirin 81 MG tablet Take 81 mg by mouth daily.    . cholecalciferol (VITAMIN D) 1000 UNITS tablet Take 1,000 Units by mouth daily.      Marland Kitchen dicyclomine (BENTYL) 10 MG capsule Take 1 capsule (10 mg total) by mouth 4 (four) times daily as needed. 120 capsule 6  . esomeprazole (NEXIUM) 40 MG capsule Take 1 capsule (40 mg total) by mouth daily before breakfast. 30 capsule 11  . FOLBIC 2.5-25-2 MG TABS tablet TAKE 1 TABLET BY MOUTH EVERY DAY 90 tablet 3  . glucose blood test strip Check twice daily and as needed.  DX E11.9 100 each 3  . irbesartan (AVAPRO) 150 MG tablet Take 1 tablet (150 mg total) by mouth daily. 30 tablet 2  . Lancets MISC Use twice daily to check blood sugar. DX E11.9  200 each 2  . metFORMIN (GLUCOPHAGE-XR) 500 MG 24 hr tablet TAKE 1 TABLET BY MOUTH 3  TIMES DAILY WITH MEALS 270 tablet 0  . metoCLOPramide (REGLAN) 5 MG tablet TAKE 1 TABLET BY MOUTH TWO  TIMES DAILY 180 tablet 1  . metoprolol tartrate (LOPRESSOR) 50 MG tablet Take 1.5 tablets (75 mg total) by mouth 2 (two) times daily. 225 tablet 2  . Multiple Vitamin (MULTIVITAMIN) tablet Take 1 tablet by mouth daily.      . Multiple Vitamins-Calcium (VIACTIV MULTI-VITAMIN) CHEW Chew 2 each by mouth daily.    . ondansetron (ZOFRAN) 4 MG tablet Take 4 mg by mouth every 4 (four) hours as needed. For nausea      . potassium chloride (K-DUR,KLOR-CON) 10 MEQ tablet TAKE 1 TABLET BY MOUTH  DAILY 90 tablet 2  . rosuvastatin (CRESTOR) 10 MG tablet TAKE 1 TABLET BY MOUTH  DAILY 90 tablet 2   No current facility-administered medications for this visit.     Past Medical History:  Diagnosis Date  . Abdominal pain in female 03/18/2010   Qualifier: Diagnosis of  By: Carlean Purl MD, Dimas Millin Anemia 06/08/2014  . Anxiety   . Arthritis    Spinal Osteoarthritis  . Cancer ([place])   . Carcinoid tumor  of stomach   . Cataract   . Chest pain    Myoview 12/15 no ischemia.  . Chronic kidney disease    Left kidney smaller than right kidney  . Constipation 11/21/2016  . Diabetes mellitus type 2 in obese ([place]) 09/05/2006   Qualifier: Diagnosis of  By: Marca Ancona RMA, Lucy    . Diabetic peripheral neuropathy ([place]) 10/29/2013  . Diverticulosis 08/30/2000   Colonoscopy   . Encounter for preventative adult health care exam with abnormal findings 09/14/2013  . Esophageal reflux   . Gastric polyp    Fundic Gland  . Gastroparesis   . Headache(784.0)   . Heart murmur    Echocardiogram 2/11: EF 60-65%, mild LAE, grade 1 diastolic dysfunction, aortic valve sclerosis, mean gradient 9 mm of mercury, PASP 34  . Hematuria 03/16/2016  . Iron deficiency anemia, unspecified   . 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 Leg swelling    bilateral  .  Neck pain 04/22/2015  . PONV (postoperative nausea and vomiting)    pt states only needs small amount of anesthesia  . PSVT (paroxysmal supraventricular tachycardia) ([place])   . Pure hypercholesterolemia   . 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 Stroke ([place])    tia, 2014  . 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 Type II or unspecified type diabetes mellitus without mention of complication, not stated as uncontrolled   . Unspecified essential hypertension   . Unspecified hereditary and idiopathic peripheral neuropathy 10/29/2013    Past Surgical History:  Procedure Laterality Date  . CHOLECYSTECTOMY  1993  . COLONOSCOPY  11/11/2010   diverticulosis  . DILATATION & CURRETTAGE/HYSTEROSCOPY WITH RESECTOCOPE N/A 02/25/2013   Procedure: Attempted hysteroscopy with uterine perforation;  Surgeon: Jamey Reas de Berton Lan, MD;  Location: [facility];  Service: Gynecology;  Laterality: N/A;  . ESOPHAGOGASTRODUODENOSCOPY  08/29/2010; 09/15/2010   Carcinoid tumor less than 1 cm in July 2012 not seen in August 2012 , gastritis, fundic gland polyps  . ESOPHAGOGASTRODUODENOSCOPY  05/16/2011  . ESOPHAGOGASTRODUODENOSCOPY  06/14/2012  . EUS  12/15/2010   Procedure: UPPER ENDOSCOPIC ULTRASOUND (EUS) LINEAR;  Surgeon: Owens Loffler, MD;  Location: [facility];  Service: Endoscopy;  Laterality: N/A;  . EYE SURGERY Bilateral    Bi lateral cateracts and bi lateral laser  . LAPAROSCOPY N/A 02/25/2013   Procedure: Cystoscopy and laparoscopy with fulguration of uterine serosa;  Surgeon: Jamey Reas de Berton Lan, MD;  Location: [facility];  Service: Gynecology;  Laterality: N/A;  . TONSILLECTOMY      ROS:   As stated in the HPI and negative for all other systems.  PHYSICAL EXAM BP (!) 162/78   Pulse 68   Ht 5\' 1"  (1.549 m)   Wt 169 lb 6.4 oz (76.8 kg)   LMP 02/07/1992   BMI 32.01 kg/m   GENERAL:  Well appearing NECK:  No jugular venous distention, waveform within normal limits, carotid upstroke brisk and symmetric, no  bruits, no thyromegaly LUNGS:  Clear to auscultation bilaterally CHEST:  Unremarkable HEART:  PMI not displaced or sustained,S1 and S2 within normal limits, no S3, no S4, no clicks, no rubs,  Brief apical systolic murmur nonradiating, no diastolic murmurs ABD:  Flat, positive bowel sounds normal in frequency in pitch, no bruits, no rebound, no guarding, no midline pulsatile mass, no hepatomegaly, no splenomegaly EXT:  2 plus pulses throughout, no edema, no cyanosis no clubbing   EKG:  Sinus rhythm, rate 65, axis within normal limits, intervals within normal limits, no  acute ST-T wave changes.   Lab Results  Component Value Date   CHOL 158 09/18/2016   TRIG 111.0 09/18/2016   HDL 56.00 09/18/2016   LDLCALC 79 09/18/2016   LDLDIRECT 123.7 04/13/2011     Lab Results  Component Value Date   HGBA1C 6.4 09/18/2016     ASSESSMENT AND PLAN  Supraventricular Tachycardia - She's not having any significant symptomatic runs of this. No change in therapy specifically for this is suggested.  Hypertension - Her blood pressure seems to be running high particularly in the morning. She'll increase her metoprolol by taking an extra one half tablet in the evening.  Dyslipidemia -  Her LDL was excellent as above. No change in therapy is suggested.  Carotid stenosis - She had mild plaque in the past.  No follow up studies are indicated at this time.   Murmur - This is mild aortic stenosis. I will follow this clinically.   We will follow this clinically.  Aortic atherosclerosis - She will continue with risk reduction.

## 2016-11-30 NOTE — Patient Instructions (Signed)
Medication Instructions:  INCREASE_ Metoprolol Tart 1 1/2 tablets(75 mg) twice a day  If you need a refill on your cardiac medications before your next appointment, please call your pharmacy.  Labwork: None Ordered   Testing/Procedures: None Ordered  Follow-Up: Your physician wants you to follow-up in: 6 Months. You should receive a reminder letter in the mail two months in advance. If you do not receive a letter, please call our office [phone].   Thank you for choosing [facility] at [facility]!!

## 2016-12-04 ENCOUNTER — Ambulatory Visit (INDEPENDENT_AMBULATORY_CARE_PROVIDER_SITE_OTHER): Payer: Medicare Other | Admitting: Internal Medicine

## 2016-12-04 ENCOUNTER — Encounter: Payer: Self-pay | Admitting: Internal Medicine

## 2016-12-04 VITALS — BP 140/68 | HR 72 | Ht 61.0 in | Wt 169.4 lb

## 2016-12-04 DIAGNOSIS — K3184 Gastroparesis: Secondary | ICD-10-CM

## 2016-12-04 DIAGNOSIS — K317 Polyp of stomach and duodenum: Secondary | ICD-10-CM | POA: Diagnosis not present

## 2016-12-04 DIAGNOSIS — R0781 Pleurodynia: Secondary | ICD-10-CM

## 2016-12-04 DIAGNOSIS — M4004 Postural kyphosis, thoracic region: Secondary | ICD-10-CM

## 2016-12-04 NOTE — Progress Notes (Signed)
[name] 76 y.o. 06-Jun-1940 [id number]  Assessment & Plan:   Encounter Diagnoses  Name Primary?  . Rib pain on right side Yes  . Postural kyphosis of thoracic region   . Gastric polyps   . Gastroparesis     Aspercreme for this rib pain  think her kyphosis is part of the problem and pushing her lower ribs down into her abdomen See Dr. Timothy Lasso again EGD -of numerous polyps and prior history of small carcinoid he will schedule at her convenience  I appreciate the opportunity to care for this patient. CC: [name], MD    Subjective:   Chief Complaint: Right upper quadrant pain  HPI The patient is complaining of pain in the right upper quadrant really the lower rib area.  Tender to touch bending, she can feel something poking her in her upper right abdomen when she bends.  No real change with eating.  No significant heartburn.  Bowel habits are not dissimilar.  She did have some vomiting issues in the last month or so when she uses her intermittent Reglan with relief with her known chronic recurrent gastroparesis. Allergies  Allergen Reactions  . Tramadol     Dizziness    Current Meds  Medication Sig  . acetaminophen (TYLENOL) 500 MG tablet Take 2 tablets (1,000 mg total) by mouth every 6 (six) hours as needed for moderate pain.  Marland Kitchen ALPRAZolam (XANAX) 0.25 MG tablet Take 1 tablet (0.25 mg total) by mouth 2 (two) times daily as needed for anxiety.  Marland Kitchen amLODipine (NORVASC) 5 MG tablet TAKE 1 TABLET BY MOUTH  DAILY  . aspirin 81 MG tablet Take 81 mg by mouth daily.  . cholecalciferol (VITAMIN D) 1000 UNITS tablet Take 1,000 Units by mouth daily.    Marland Kitchen dicyclomine (BENTYL) 10 MG capsule Take 1 capsule (10 mg total) by mouth 4 (four) times daily as needed.  Marland Kitchen esomeprazole (NEXIUM) 40 MG capsule Take 1 capsule (40 mg total) by mouth daily before breakfast.  . FOLBIC 2.5-25-2 MG TABS tablet TAKE 1 TABLET BY MOUTH EVERY DAY  . glucose blood test strip Check twice  daily and as needed.  DX E11.9  . irbesartan (AVAPRO) 150 MG tablet Take 1 tablet (150 mg total) by mouth daily.  . Lancets MISC Use twice daily to check blood sugar. DX E11.9  . metFORMIN (GLUCOPHAGE-XR) 500 MG 24 hr tablet TAKE 1 TABLET BY MOUTH 3  TIMES DAILY WITH MEALS  . metoCLOPramide (REGLAN) 5 MG tablet TAKE 1 TABLET BY MOUTH TWO  TIMES DAILY  . metoprolol tartrate (LOPRESSOR) 50 MG tablet Take 1.5 tablets (75 mg total) by mouth 2 (two) times daily.  . Multiple Vitamin (MULTIVITAMIN) tablet Take 1 tablet by mouth daily.    . Multiple Vitamins-Calcium (VIACTIV MULTI-VITAMIN) CHEW Chew 2 each by mouth daily.  . ondansetron (ZOFRAN) 4 MG tablet Take 4 mg by mouth every 4 (four) hours as needed. For nausea    . potassium chloride (K-DUR,KLOR-CON) 10 MEQ tablet TAKE 1 TABLET BY MOUTH  DAILY  . rosuvastatin (CRESTOR) 10 MG tablet TAKE 1 TABLET BY MOUTH  DAILY   Past Medical History:  Diagnosis Date  . Abdominal pain in female 03/18/2010   Qualifier: Diagnosis of  By: Carlean Purl MD, Dimas Millin Anemia 06/08/2014  . Anxiety   . Arthritis    Spinal Osteoarthritis  . Cancer ([place])   . Carcinoid tumor of stomach   . Cataract   .  Chest pain    Myoview 12/15 no ischemia.  . Chronic kidney disease    Left kidney smaller than right kidney  . Constipation 11/21/2016  . Diabetes mellitus type 2 in obese ([place]) 09/05/2006   Qualifier: Diagnosis of  By: Marca Ancona RMA, Lucy    . Diabetic peripheral neuropathy ([place]) 10/29/2013  . Diverticulosis 08/30/2000   Colonoscopy   . Encounter for preventative adult health care exam with abnormal findings 09/14/2013  . Esophageal reflux   . Gastric polyp    Fundic Gland  . Gastroparesis   . Headache(784.0)   . Heart murmur    Echocardiogram 2/11: EF 60-65%, mild LAE, grade 1 diastolic dysfunction, aortic valve sclerosis, mean gradient 9 mm of mercury, PASP 34  . Hematuria 03/16/2016  . Iron deficiency anemia, unspecified   . 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 Leg  swelling    bilateral  . Neck pain 04/22/2015  . PONV (postoperative nausea and vomiting)    pt states only needs small amount of anesthesia  . PSVT (paroxysmal supraventricular tachycardia) ([place])   . Pure hypercholesterolemia   . 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 Stroke ([place])    tia, 2014  . 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 Type II or unspecified type diabetes mellitus without mention of complication, not stated as uncontrolled   . Unspecified essential hypertension   . Unspecified hereditary and idiopathic peripheral neuropathy 10/29/2013   Past Surgical History:  Procedure Laterality Date  . CHOLECYSTECTOMY  1993  . COLONOSCOPY  11/11/2010   diverticulosis  . DILATATION & CURRETTAGE/HYSTEROSCOPY WITH RESECTOCOPE N/A 02/25/2013   Procedure: Attempted hysteroscopy with uterine perforation;  Surgeon: Jamey Reas de Berton Lan, MD;  Location: [facility];  Service: Gynecology;  Laterality: N/A;  . ESOPHAGOGASTRODUODENOSCOPY  08/29/2010; 09/15/2010   Carcinoid tumor less than 1 cm in July 2012 not seen in August 2012 , gastritis, fundic gland polyps  . ESOPHAGOGASTRODUODENOSCOPY  05/16/2011  . ESOPHAGOGASTRODUODENOSCOPY  06/14/2012  . EUS  12/15/2010   Procedure: UPPER ENDOSCOPIC ULTRASOUND (EUS) LINEAR;  Surgeon: Owens Loffler, MD;  Location: [facility];  Service: Endoscopy;  Laterality: N/A;  . EYE SURGERY Bilateral    Bi lateral cateracts and bi lateral laser  . LAPAROSCOPY N/A 02/25/2013   Procedure: Cystoscopy and laparoscopy with fulguration of uterine serosa;  Surgeon: Jamey Reas de Berton Lan, MD;  Location: [facility];  Service: Gynecology;  Laterality: N/A;  . TONSILLECTOMY     Social History   Social History  . Marital status: Married    Spouse name: N/A  . Number of children: 0  . Years of education: college   Occupational History  . Retail    Social History Main Topics  . Smoking status: Never Smoker  . Smokeless tobacco: Never Used     Comment: Never used tobacco  .  Alcohol use No  . Drug use: No  . Sexual activity: No     Comment: lives alone, no dietary restrictions except avoid fresh veg, fruit, whole grains   Other Topics Concern  . Not on file   Social History Narrative   Patient was married ([name]) - widow   Patient does not have any children.   Patient is right-handed.   Patient has a BA degree.   One caffeine drink daily    family history includes Diabetes in her brother, maternal grandmother, mother, sister, and sister; Heart disease in her brother, brother, sister, sister, and sister; Hyperlipidemia in her brother, sister, and sister;  Hypertension in her brother, paternal grandmother, sister, and sister; Stroke in her father.   Review of Systems   Objective:   Physical Exam BP 140/68   Pulse 72   Ht 5\' 1"  (1.549 m)   Wt 169 lb 6.4 oz (76.8 kg)   LMP 02/07/1992   BMI 32.01 kg/m  No acute distress Anicteric Is tender on right ant ribs Kyphotic xrays reviewed  No HSM, mass  is alert and oriented x3 .  Mood and affect

## 2016-12-04 NOTE — Patient Instructions (Signed)
It has been recommended to you by your physician that you have a(n) EGD completed. Per your request, we did not schedule the procedure(s) today. Please contact our office at [phone] should you decide to have the procedure completed.   Please follow up with Dr Barbaraann Barthel regarding your rib pain.   Continue using your Aspercreme.   I appreciate the opportunity to care for you. Silvano Rusk, MD, [facility]

## 2016-12-12 ENCOUNTER — Telehealth: Payer: Self-pay | Admitting: Family Medicine

## 2016-12-12 NOTE — Telephone Encounter (Signed)
Requesting refill on BP med irbesartan 150mg , [facility].

## 2016-12-12 NOTE — Telephone Encounter (Signed)
Also would like Dr Charlett Blake to look over Dr Carlean Purl notes and see what she thinks, call back [phone]

## 2016-12-14 ENCOUNTER — Telehealth: Payer: Self-pay | Admitting: Cardiology

## 2016-12-14 NOTE — Telephone Encounter (Signed)
Spoke with pt, suddenly this morning her heart rate elevated. She took 300 mg of avapro, extra 50 mg of metoprolol and chewed 4 baby aspirin. Currently she feels better but is very tired. Her bp now is 153/96 with pulse 62 bpm. This is the first time she has had this in one year. She is concerned and wanted to let dr hochrein know. Will forward for his review and advise.

## 2016-12-14 NOTE — Telephone Encounter (Signed)
New message  Pt c/o BP issue: STAT if pt c/o blurred vision, one-sided weakness or slurred speech  1. What are your last 5 BP readings? Per pt states he bp was high 194/113 hr 127  2. Are you having any other symptoms (ex. Dizziness, headache, blurred vision, passed out)?   3. What is your BP issue? pe rpt states she feels much better now. But would like to speak with RN to see if she would need to f/u. Please call back to discuss

## 2016-12-15 ENCOUNTER — Telehealth: Payer: Self-pay | Admitting: Cardiology

## 2016-12-15 MED ORDER — LOSARTAN POTASSIUM 50 MG PO TABS: 50.0000 mg | ORAL_TABLET | Freq: Two times a day (BID) | ORAL | 0 refills | Status: DC

## 2016-12-15 MED ORDER — IRBESARTAN 300 MG PO TABS: 300.0000 mg | ORAL_TABLET | Freq: Every day | ORAL | Status: DC

## 2016-12-15 MED FILL — LOSARTAN POTASSIUM 50 MG TA: 50 | 30 days supply | Qty: 60 | Fill #0

## 2016-12-15 NOTE — Telephone Encounter (Signed)
Follow up    Patient calling back regarding medication adjustment. Please call   Pt c/o BP issue: STAT if pt c/o blurred vision, one-sided weakness or slurred speech  1. What are your last 5 BP readings? 160/91  2. Are you having any other symptoms (ex. Dizziness, headache, blurred vision, passed out)? headache  3. What is your BP issue? Requesting medication adjustment clarification

## 2016-12-15 NOTE — Telephone Encounter (Signed)
Mrs. Roehrich is calling because her Primary care doctor told her that Iberstatin was being removed off the shelf and is suggesting Losartan . Want to know what does Dr.  Percival Spanish thinks and will he approved the losartan . Please call

## 2016-12-15 NOTE — Telephone Encounter (Signed)
Spoke with patient advised her on the recall of the medication and explained the switch to her, She stated she did not want to switch unless her cardiologist changed the medication.       Spoke with RN from the patients cardiologist she stated she will take care of the rx and the patient for [place].

## 2016-12-15 NOTE — Telephone Encounter (Signed)
Returned the call to the patient. She stated that she would like to switch to Losartan 50 mg bid due to the recent recall of Irbesartan. She has been instructed to keep track of her blood pressure as she had been doing and to call us if she continues to have a problem with her pressures being high. She verbalized her understanding.

## 2016-12-15 NOTE — Telephone Encounter (Signed)
Call returned to the patient. The patient has been having a hard time controlling her blood pressure (please see epic note). Dr. Percival Spanish had increased her irbesartan to 300 mg today.  The patient has called back concerned about the recall. She stated that her PCP wants her to switch to Losartan 100 mg. Will route to pharmd for their input.

## 2016-12-15 NOTE — Telephone Encounter (Signed)
Returned the call to the patient. She stated that her blood pressure has been increasing and she has started getting mild headaches.  This morning it was 160/91 and heart rate was 59. After the medications it was 175/80 and heart rate was 57. She currently takes Metoprolol 75 mg in the morning and evening. Irbesartan 150 mg in the morning and Amlodipine 5 mg in the evening.  She would like to increase her Irbesartan to 300 mg daily, as it was before. Per Dr. Percival Spanish, he advised that this was okay. The patient has been made aware and verbalized her understanding. She has been instructed to call back if her blood pressure does not decrease or if she has any further needs.

## 2016-12-15 NOTE — Addendum Note (Signed)
Addended by: Ricci Barker on: 12/15/2016 03:49 PM   Modules accepted: Orders

## 2016-12-15 NOTE — Telephone Encounter (Signed)
Losartan 50 mg daily. TY.

## 2016-12-15 NOTE — Telephone Encounter (Signed)
Only 2 manufacturers affceted by Irbesartan recall (so far) are: [employer] and [employer]  1. Okay to continue Irbesartan 300mg  for now if current supply not affected.  2. May change to olmesartan 40mg  daily or losartan 50mg  twice daily if unable to continue Irbesartan.  3. Patient will need BP monitoring during medication adjustments

## 2016-12-15 NOTE — Telephone Encounter (Signed)
DOD: [name]   Please advise on new BP medication due to recall

## 2017-01-01 ENCOUNTER — Ambulatory Visit (AMBULATORY_SURGERY_CENTER): Payer: Self-pay | Admitting: *Deleted

## 2017-01-01 ENCOUNTER — Other Ambulatory Visit: Payer: Self-pay

## 2017-01-01 VITALS — Ht 60.0 in | Wt 169.0 lb

## 2017-01-01 DIAGNOSIS — K317 Polyp of stomach and duodenum: Secondary | ICD-10-CM

## 2017-01-01 DIAGNOSIS — R0781 Pleurodynia: Secondary | ICD-10-CM

## 2017-01-01 NOTE — Progress Notes (Signed)
Patient denies any allergies to eggs or soy. Patient denies any problems with anesthesia/sedation. Patient denies any oxygen use at home. Patient denies taking any diet/weight loss medications or blood thinners. EMMI education assisgned to patient on EGD, this was explained and instructions given to patient. Pt states no medical or surgical changes since office visit.

## 2017-01-02 ENCOUNTER — Encounter: Payer: Self-pay | Admitting: Internal Medicine

## 2017-01-04 ENCOUNTER — Other Ambulatory Visit (INDEPENDENT_AMBULATORY_CARE_PROVIDER_SITE_OTHER): Payer: Medicare Other

## 2017-01-04 DIAGNOSIS — I10 Essential (primary) hypertension: Secondary | ICD-10-CM

## 2017-01-04 DIAGNOSIS — R109 Unspecified abdominal pain: Secondary | ICD-10-CM | POA: Diagnosis not present

## 2017-01-04 DIAGNOSIS — E669 Obesity, unspecified: Secondary | ICD-10-CM | POA: Diagnosis not present

## 2017-01-04 DIAGNOSIS — E7800 Pure hypercholesterolemia, unspecified: Secondary | ICD-10-CM | POA: Diagnosis not present

## 2017-01-04 DIAGNOSIS — E559 Vitamin D deficiency, unspecified: Secondary | ICD-10-CM | POA: Diagnosis not present

## 2017-01-04 DIAGNOSIS — E1169 Type 2 diabetes mellitus with other specified complication: Secondary | ICD-10-CM | POA: Diagnosis not present

## 2017-01-04 LAB — COMPREHENSIVE METABOLIC PANEL
ALT: 24 U/L (ref 0–35)
AST: 22 U/L (ref 0–37)
Albumin: 4.1 g/dL (ref 3.5–5.2)
Alkaline Phosphatase: 55 U/L (ref 39–117)
BILIRUBIN TOTAL: 0.9 mg/dL (ref 0.2–1.2)
BUN: 16 mg/dL (ref 6–23)
CO2: 26 meq/L (ref 19–32)
CREATININE: 0.7 mg/dL (ref 0.40–1.20)
Calcium: 9.8 mg/dL (ref 8.4–10.5)
Chloride: 101 mEq/L (ref 96–112)
GFR: 86.53 mL/min (ref >60.00)
GLUCOSE: 121 mg/dL — AB (ref 70–99)
Potassium: 4 mEq/L (ref 3.5–5.1)
SODIUM: 137 meq/L (ref 135–145)
Total Protein: 7.1 g/dL (ref 6.0–8.3)

## 2017-01-04 LAB — CBC WITH DIFFERENTIAL/PLATELET
BASOS ABS: 0.1 10*3/uL (ref 0.0–0.1)
Basophils Relative: 0.9 % (ref 0.0–3.0)
EOS ABS: 0.1 10*3/uL (ref 0.0–0.7)
Eosinophils Relative: 1.6 % (ref 0.0–5.0)
HCT: 39 % (ref 36.0–46.0)
Hemoglobin: 13 g/dL (ref 12.0–15.0)
LYMPHS ABS: 2.3 10*3/uL (ref 0.7–4.0)
Lymphocytes Relative: 33.4 % (ref 12.0–46.0)
MCHC: 33.4 g/dL (ref 30.0–36.0)
MCV: 86.5 fl (ref 78.0–100.0)
MONOS PCT: 8.7 % (ref 3.0–12.0)
Monocytes Absolute: 0.6 10*3/uL (ref 0.1–1.0)
NEUTROS ABS: 3.8 10*3/uL (ref 1.4–7.7)
NEUTROS PCT: 55.4 % (ref 43.0–77.0)
PLATELETS: 220 10*3/uL (ref 150.0–400.0)
RBC: 4.51 Mil/uL (ref 3.87–5.11)
RDW: 12.9 % (ref 11.5–15.5)
WBC: 6.8 10*3/uL (ref 4.0–10.5)

## 2017-01-04 LAB — LIPID PANEL
CHOL/HDL RATIO: 3
Cholesterol: 207 mg/dL — ABNORMAL HIGH (ref 0–200)
HDL: 59.7 mg/dL (ref >39.00)
LDL Cholesterol: 118 mg/dL — ABNORMAL HIGH (ref 0–99)
NONHDL: 147.12
Triglycerides: 146 mg/dL (ref 0.0–149.0)
VLDL: 29.2 mg/dL (ref 0.0–40.0)

## 2017-01-04 LAB — HEMOGLOBIN A1C: Hgb A1c MFr Bld: 6.3 % (ref 4.6–6.5)

## 2017-01-04 LAB — TSH: TSH: 2.47 u[IU]/mL (ref 0.35–4.50)

## 2017-01-04 LAB — VITAMIN D 25 HYDROXY (VIT D DEFICIENCY, FRACTURES): VITD: 41.55 ng/mL (ref 30.00–100.00)

## 2017-01-08 ENCOUNTER — Ambulatory Visit (INDEPENDENT_AMBULATORY_CARE_PROVIDER_SITE_OTHER): Payer: Medicare Other | Admitting: Family Medicine

## 2017-01-08 ENCOUNTER — Encounter: Payer: Self-pay | Admitting: Family Medicine

## 2017-01-08 ENCOUNTER — Other Ambulatory Visit: Payer: Self-pay | Admitting: Family Medicine

## 2017-01-08 DIAGNOSIS — R1010 Upper abdominal pain, unspecified: Secondary | ICD-10-CM

## 2017-01-08 DIAGNOSIS — Z1231 Encounter for screening mammogram for malignant neoplasm of breast: Secondary | ICD-10-CM

## 2017-01-08 DIAGNOSIS — E669 Obesity, unspecified: Secondary | ICD-10-CM

## 2017-01-08 DIAGNOSIS — I10 Essential (primary) hypertension: Secondary | ICD-10-CM | POA: Diagnosis not present

## 2017-01-08 DIAGNOSIS — E1169 Type 2 diabetes mellitus with other specified complication: Secondary | ICD-10-CM | POA: Diagnosis not present

## 2017-01-08 DIAGNOSIS — K5900 Constipation, unspecified: Secondary | ICD-10-CM | POA: Diagnosis not present

## 2017-01-08 DIAGNOSIS — E7800 Pure hypercholesterolemia, unspecified: Secondary | ICD-10-CM | POA: Diagnosis not present

## 2017-01-08 DIAGNOSIS — E559 Vitamin D deficiency, unspecified: Secondary | ICD-10-CM | POA: Diagnosis not present

## 2017-01-08 MED ORDER — LOSARTAN POTASSIUM 50 MG PO TABS: 50.0000 mg | ORAL_TABLET | Freq: Two times a day (BID) | ORAL | 0 refills | Status: DC

## 2017-01-08 MED FILL — LOSARTAN POTASSIUM 50 MG TA: 50 | 30 days supply | Qty: 60 | Fill #0

## 2017-01-08 NOTE — Progress Notes (Signed)
Subjective:  I acted as a Education administrator for Dr. Charlett Blake. [name], [place]  [name], female    DOB: 1940-10-28, 76 y.o.   MRN: [id number]  No chief complaint on file.   HPI  Patient is in today for a 3 month follow up and she continues to struggle with abdominal pain especially in the RUQ. Worse when she sits down. Better when she stands up. No correlation to eating or moving her bowels. No recent febrile illness or acute hospitalizaitons. Denies CP/palp/SOB/HA/congestion/fevers/GI or GU c/o. Taking meds as prescribed  Patient Care Team: Valerie Lukes, MD as PCP - General (Family Medicine) Valerie Mayer, MD as Consulting Physician (Gastroenterology) [name], Valerie Locket, MD (Neurology) Valerie Breeding, MD as Attending Physician (Cardiology)   Past Medical History:  Diagnosis Date  . Abdominal pain in female 03/18/2010   Qualifier: Diagnosis of  By: Valerie Purl MD, [name] [name] 06/08/2014  . Anxiety   . Arthritis    Spinal Osteoarthritis  . Cancer ([place])   . Carcinoid tumor of stomach   . Cataract   . Chest pain    Myoview 12/15 no ischemia.  . Chronic kidney disease    Left kidney smaller than right kidney  . Constipation 11/21/2016  . Diabetes mellitus type 2 in obese ([place]) 09/05/2006   Qualifier: Diagnosis of  By: [name], [name]    . Diabetic peripheral neuropathy ([place]) 10/29/2013  . Diverticulosis 08/30/2000   Colonoscopy   . Encounter for preventative adult health care exam with abnormal findings 09/14/2013  . Esophageal reflux   . Gastric polyp    Fundic Gland  . Gastroparesis   . Headache(784.0)   . Heart murmur    Echocardiogram 2/11: EF 60-65%, mild LAE, grade 1 diastolic dysfunction, aortic valve sclerosis, mean gradient 9 mm of mercury, PASP 34  . Hematuria 03/16/2016  . Iron deficiency [name], unspecified   . 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 Leg swelling    bilateral  . Neck pain 04/22/2015  . PONV (postoperative nausea and vomiting)    pt states  only needs small amount of anesthesia  . PSVT (paroxysmal supraventricular tachycardia) ([place])   . Pure hypercholesterolemia   . 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 Stroke ([place])    tia, 2014  . 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 Type II or unspecified type diabetes mellitus without mention of complication, not stated as uncontrolled   . Unspecified essential hypertension   . Unspecified hereditary and idiopathic peripheral neuropathy 10/29/2013    Past Surgical History:  Procedure Laterality Date  . CHOLECYSTECTOMY  1993  . COLONOSCOPY  11/11/2010   diverticulosis  . DILATATION & CURRETTAGE/HYSTEROSCOPY WITH RESECTOCOPE N/A 02/25/2013   Procedure: Attempted hysteroscopy with uterine perforation;  Surgeon: Valerie Reas de Berton Lan, MD;  Location: [facility];  Service: Gynecology;  Laterality: N/A;  . ESOPHAGOGASTRODUODENOSCOPY  08/29/2010; 09/15/2010   Carcinoid tumor less than 1 cm in July 2012 not seen in August 2012 , gastritis, fundic gland polyps  . ESOPHAGOGASTRODUODENOSCOPY  05/16/2011  . ESOPHAGOGASTRODUODENOSCOPY  06/14/2012  . EUS  12/15/2010   Procedure: UPPER ENDOSCOPIC ULTRASOUND (EUS) LINEAR;  Surgeon: Valerie Loffler, MD;  Location: [facility];  Service: Endoscopy;  Laterality: N/A;  . EYE SURGERY Bilateral    Bi lateral cateracts and bi lateral laser  . LAPAROSCOPY N/A 02/25/2013   Procedure: Cystoscopy and laparoscopy with fulguration of uterine serosa;  Surgeon: Valerie Reas de Berton Lan, MD;  Location: [facility];  Service: Gynecology;  Laterality: N/A;  . TONSILLECTOMY      Family History  Problem Relation Age of Onset  . Diabetes Mother   . Stroke Father        deceased age 75  . Heart disease Sister        deceased MI age 37  . Heart disease Brother        deceased MI age 27  . Diabetes Maternal Grandmother   . Hypertension Paternal Grandmother   . Diabetes Sister   . Heart disease Sister   . Hypertension Sister   . Hyperlipidemia Sister   . Diabetes Sister   .  Heart disease Sister   . Hypertension Sister   . Hyperlipidemia Sister   . Diabetes Brother   . Heart disease Brother   . Hypertension Brother   . Hyperlipidemia Brother   . Colon cancer Neg Hx   . Esophageal cancer Neg Hx   . Stomach cancer Neg Hx   . Rectal cancer Neg Hx     Social History   Socioeconomic History  . Marital status: Married    Spouse name: Not on file  . Number of children: 0  . Years of education: college  . Highest education level: Not on file  Social Needs  . Financial resource strain: Not on file  . Food insecurity - worry: Not on file  . Food insecurity - inability: Not on file  . Transportation needs - medical: Not on file  . Transportation needs - non-medical: Not on file  Occupational History  . Occupation: Retail  Tobacco Use  . Smoking status: Never Smoker  . Smokeless tobacco: Never Used  . Tobacco comment: Never used tobacco  Substance and Sexual Activity  . Alcohol use: No    Alcohol/week: 0.0 oz  . Drug use: No  . Sexual activity: No    Partners: Male    Birth control/protection: Post-menopausal    Comment: lives alone, no dietary restrictions except avoid fresh veg, fruit, whole grains  Other Topics Concern  . Not on file  Social History Narrative   Patient was married ([name]) - widow   Patient does not have any children.   Patient is right-handed.   Patient has a BA degree.   One caffeine drink daily     Outpatient Medications Prior to Visit  Medication Sig Dispense Refill  . acetaminophen (TYLENOL) 500 MG tablet Take 2 tablets (1,000 mg total) by mouth every 6 (six) hours as needed for moderate pain. 30 tablet 0  . ALPRAZolam (XANAX) 0.25 MG tablet Take 1 tablet (0.25 mg total) by mouth 2 (two) times daily as needed for anxiety. 30 tablet 1  . amLODipine (NORVASC) 5 MG tablet TAKE 1 TABLET BY MOUTH  DAILY 90 tablet 3  . aspirin 81 MG tablet Take 81 mg by mouth daily.    . cholecalciferol (VITAMIN D) 1000 UNITS tablet Take  1,000 Units by mouth daily.      Marland Kitchen dicyclomine (BENTYL) 10 MG capsule Take 1 capsule (10 mg total) by mouth 4 (four) times daily as needed. 120 capsule 6  . esomeprazole (NEXIUM) 40 MG capsule Take 1 capsule (40 mg total) by mouth daily before breakfast. 30 capsule 11  . FOLBIC 2.5-25-2 MG TABS tablet TAKE 1 TABLET BY MOUTH EVERY DAY 90 tablet 3  . glucose blood test strip Check twice daily and as needed.  DX E11.9 100 each 3  . Lancets MISC Use  twice daily to check blood sugar. DX E11.9 200 each 2  . metFORMIN (GLUCOPHAGE-XR) 500 MG 24 hr tablet TAKE 1 TABLET BY MOUTH 3  TIMES DAILY WITH MEALS 270 tablet 0  . metoCLOPramide (REGLAN) 5 MG tablet TAKE 1 TABLET BY MOUTH TWO  TIMES DAILY 180 tablet 1  . metoprolol tartrate (LOPRESSOR) 50 MG tablet Take 1.5 tablets (75 mg total) by mouth 2 (two) times daily. 225 tablet 2  . Multiple Vitamin (MULTIVITAMIN) tablet Take 1 tablet by mouth daily.      . Multiple Vitamins-Calcium (VIACTIV MULTI-VITAMIN) CHEW Chew 2 each by mouth daily.    . ondansetron (ZOFRAN) 4 MG tablet Take 4 mg by mouth every 4 (four) hours as needed. For nausea      . potassium chloride (K-DUR,KLOR-CON) 10 MEQ tablet TAKE 1 TABLET BY MOUTH  DAILY 90 tablet 2  . rosuvastatin (CRESTOR) 10 MG tablet TAKE 1 TABLET BY MOUTH  DAILY 90 tablet 2  . losartan (COZAAR) 50 MG tablet Take 1 tablet (50 mg total) 2 (two) times daily by mouth. 60 tablet 0   No facility-administered medications prior to visit.     Allergies  Allergen Reactions  . Tramadol Other (See Comments)    Dizziness     Review of Systems  Constitutional: Positive for malaise/fatigue. Negative for fever.  HENT: Negative for congestion.   Eyes: Negative for blurred vision.  Respiratory: Negative for shortness of breath.   Cardiovascular: Negative for chest pain, palpitations and leg swelling.  Gastrointestinal: Positive for abdominal pain and constipation. Negative for blood in stool, diarrhea and nausea.    Genitourinary: Negative for dysuria and frequency.  Musculoskeletal: Negative for falls.  Skin: Negative for rash.  Neurological: Negative for dizziness, loss of consciousness and headaches.  Endo/Heme/Allergies: Negative for environmental allergies.  Psychiatric/Behavioral: Negative for depression. The patient is not nervous/anxious.        Objective:    Physical Exam  Constitutional: She is oriented to person, place, and time. She appears well-developed and well-nourished. No distress.  HENT:  Head: Normocephalic and atraumatic.  Nose: Nose normal.  Eyes: Right eye exhibits no discharge. Left eye exhibits no discharge.  Neck: Normal range of motion. Neck supple.  Cardiovascular: Normal rate and regular rhythm.  No murmur heard. Pulmonary/Chest: Effort normal and breath sounds normal.  Abdominal: Soft. Bowel sounds are normal. There is no tenderness.  Musculoskeletal: She exhibits no edema.  Neurological: She is alert and oriented to person, place, and time.  Skin: Skin is warm and dry.  Psychiatric: She has a normal mood and affect.  Nursing note and vitals reviewed.   BP (!) 118/58 (BP Location: Left Arm, Patient Position: Sitting, Cuff Size: Normal)   Pulse 64   Temp 97.7 F (36.5 C) (Oral)   Resp 18   Wt 171 lb (77.6 kg)   LMP 02/07/1992   SpO2 98%   BMI 33.40 kg/m  Wt Readings from Last 3 Encounters:  01/08/17 171 lb (77.6 kg)  01/01/17 169 lb (76.7 kg)  12/04/16 169 lb 6.4 oz (76.8 kg)   BP Readings from Last 3 Encounters:  01/08/17 (!) 118/58  12/04/16 140/68  11/30/16 (!) 162/78     Immunization History  Administered Date(s) Administered  . Influenza Split 10/31/2010, 11/28/2011  . Influenza Whole 11/19/2007, 11/04/2008, 11/10/2009  . Influenza, High Dose Seasonal PF 11/11/2015, 11/21/2016  . Influenza,inj,Quad PF,6+ Mos 01/09/2013, 10/20/2013, 11/13/2014  . Pneumococcal Conjugate-13 09/11/2013  . Pneumococcal Polysaccharide-23 03/13/2007  . Td  03/02/2005    Health Maintenance  Topic Date Due  . Samul Dada  03/03/2015  . FOOT EXAM  11/13/2015  . OPHTHALMOLOGY EXAM  02/06/2017  . HEMOGLOBIN A1C  07/04/2017  . COLONOSCOPY  11/10/2020  . INFLUENZA VACCINE  Completed  . DEXA SCAN  Completed  . PNA vac Low Risk Adult  Completed    Lab Results  Component Value Date   WBC 6.8 01/04/2017   HGB 13.0 01/04/2017   HCT 39.0 01/04/2017   PLT 220.0 01/04/2017   GLUCOSE 121 (H) 01/04/2017   CHOL 207 (H) 01/04/2017   TRIG 146.0 01/04/2017   HDL 59.70 01/04/2017   LDLDIRECT 123.7 04/13/2011   LDLCALC 118 (H) 01/04/2017   ALT 24 01/04/2017   AST 22 01/04/2017   NA 137 01/04/2017   K 4.0 01/04/2017   CL 101 01/04/2017   CREATININE 0.70 01/04/2017   BUN 16 01/04/2017   CO2 26 01/04/2017   TSH 2.47 01/04/2017   INR 0.88 01/16/2011   HGBA1C 6.3 01/04/2017   MICROALBUR 1.6 11/13/2014    Lab Results  Component Value Date   TSH 2.47 01/04/2017   Lab Results  Component Value Date   WBC 6.8 01/04/2017   HGB 13.0 01/04/2017   HCT 39.0 01/04/2017   MCV 86.5 01/04/2017   PLT 220.0 01/04/2017   Lab Results  Component Value Date   NA 137 01/04/2017   K 4.0 01/04/2017   CHLORIDE 101 07/08/2015   CO2 26 01/04/2017   GLUCOSE 121 (H) 01/04/2017   BUN 16 01/04/2017   CREATININE 0.70 01/04/2017   BILITOT 0.9 01/04/2017   ALKPHOS 55 01/04/2017   AST 22 01/04/2017   ALT 24 01/04/2017   PROT 7.1 01/04/2017   ALBUMIN 4.1 01/04/2017   CALCIUM 9.8 01/04/2017   ANIONGAP 8 07/08/2015   EGFR 75 (L) 07/08/2015   GFR 86.53 01/04/2017   Lab Results  Component Value Date   CHOL 207 (H) 01/04/2017   Lab Results  Component Value Date   HDL 59.70 01/04/2017   Lab Results  Component Value Date   LDLCALC 118 (H) 01/04/2017   Lab Results  Component Value Date   TRIG 146.0 01/04/2017   Lab Results  Component Value Date   CHOLHDL 3 01/04/2017   Lab Results  Component Value Date   HGBA1C 6.3 01/04/2017          Assessment & Plan:   Problem List Items Addressed This Visit    Diabetes mellitus type 2 in obese ([place])    hgba1c acceptable, minimize simple carbs. Increase exercise as tolerated. Continue current meds      Relevant Medications   losartan (COZAAR) 50 MG tablet   Other Relevant Orders   Hemoglobin A1c   TSH   HYPERCHOLESTEROLEMIA    Tolerating statin, encouraged heart healthy diet, avoid trans fats, minimize simple carbs and saturated fats. Increase exercise as tolerated      Relevant Medications   losartan (COZAAR) 50 MG tablet   Other Relevant Orders   Lipid panel   Essential hypertension    Well controlled, no changes to meds. Encouraged heart healthy diet such as the DASH diet and exercise as tolerated.       Relevant Medications   losartan (COZAAR) 50 MG tablet   Other Relevant Orders   CBC   Comprehensive metabolic panel   TSH   Vitamin D deficiency    Encouraged daily supplements.       Relevant Orders  VITAMIN D 25 Hydroxy (Vit-D Deficiency, Fractures)   Pain of upper abdomen    RUQ patient has an upper endoscopy later this week. Pain is worse when sitting, better when standing. No change in bowels.       Constipation    Bowels moving better, encouraged to try Miralax and Benefiber together         I have changed Monick N. Graffius's losartan. I am also having her maintain her multivitamin, cholecalciferol, ondansetron, acetaminophen, aspirin, esomeprazole, metoCLOPramide, dicyclomine, Lancets, glucose blood, FOLBIC, VIACTIV MULTI-VITAMIN, potassium chloride, rosuvastatin, ALPRAZolam, amLODipine, metFORMIN, and metoprolol tartrate.  Meds ordered this encounter  Medications  . losartan (COZAAR) 50 MG tablet    Sig: Take 1 tablet (50 mg total) by mouth 2 (two) times daily.    Dispense:  60 tablet    Refill:  0    CMA served as scribe during this visit. History, Physical and Plan performed by medical provider. Documentation and orders reviewed  and attested to.  Penni Homans, MD

## 2017-01-08 NOTE — Assessment & Plan Note (Signed)
Bowels moving better, encouraged to try Miralax and Benefiber together

## 2017-01-08 NOTE — Patient Instructions (Signed)
Shingrix is the new shingles shot 2 shots over 2-6 months. Can get at pharmacy Hypertension Hypertension is another name for high blood pressure. High blood pressure forces your heart to work harder to pump blood. This can cause problems over time. There are two numbers in a blood pressure reading. There is a top number (systolic) over a bottom number (diastolic). It is best to have a blood pressure below 120/80. Healthy choices can help lower your blood pressure. You may need medicine to help lower your blood pressure if:  Your blood pressure cannot be lowered with healthy choices.  Your blood pressure is higher than 130/80.  Follow these instructions at home: Eating and drinking  If directed, follow the DASH eating plan. This diet includes: ? Filling half of your plate at each meal with fruits and vegetables. ? Filling one quarter of your plate at each meal with whole grains. Whole grains include whole wheat pasta, brown rice, and whole grain bread. ? Eating or drinking low-fat dairy products, such as skim milk or low-fat yogurt. ? Filling one quarter of your plate at each meal with low-fat (lean) proteins. Low-fat proteins include fish, skinless chicken, eggs, beans, and tofu. ? Avoiding fatty meat, cured and processed meat, or chicken with skin. ? Avoiding premade or processed food.  Eat less than 1,500 mg of salt (sodium) a day.  Limit alcohol use to no more than 1 drink a day for nonpregnant women and 2 drinks a day for men. One drink equals 12 oz of beer, 5 oz of wine, or 1 oz of hard liquor. Lifestyle  Work with your doctor to stay at a healthy weight or to lose weight. Ask your doctor what the best weight is for you.  Get at least 30 minutes of exercise that causes your heart to beat faster (aerobic exercise) most days of the week. This may include walking, swimming, or biking.  Get at least 30 minutes of exercise that strengthens your muscles (resistance exercise) at least 3  days a week. This may include lifting weights or pilates.  Do not use any products that contain nicotine or tobacco. This includes cigarettes and e-cigarettes. If you need help quitting, ask your doctor.  Check your blood pressure at home as told by your doctor.  Keep all follow-up visits as told by your doctor. This is important. Medicines  Take over-the-counter and prescription medicines only as told by your doctor. Follow directions carefully.  Do not skip doses of blood pressure medicine. The medicine does not work as well if you skip doses. Skipping doses also puts you at risk for problems.  Ask your doctor about side effects or reactions to medicines that you should watch for. Contact a doctor if:  You think you are having a reaction to the medicine you are taking.  You have headaches that keep coming back (recurring).  You feel dizzy.  You have swelling in your ankles.  You have trouble with your vision. Get help right away if:  You get a very bad headache.  You start to feel confused.  You feel weak or numb.  You feel faint.  You get very bad pain in your: ? Chest. ? Belly (abdomen).  You throw up (vomit) more than once.  You have trouble breathing. Summary  Hypertension is another name for high blood pressure.  Making healthy choices can help lower blood pressure. If your blood pressure cannot be controlled with healthy choices, you may need to take medicine.  This information is not intended to replace advice given to you by your health care provider. Make sure you discuss any questions you have with your health care provider. Document Released: 07/12/2007 Document Revised: 12/22/2015 Document Reviewed: 12/22/2015 Elsevier Interactive Patient Education  [employer].

## 2017-01-08 NOTE — Assessment & Plan Note (Signed)
RUQ patient has an upper endoscopy later this week. Pain is worse when sitting, better when standing. No change in bowels.

## 2017-01-08 NOTE — Assessment & Plan Note (Signed)
Well controlled, no changes to meds. Encouraged heart healthy diet such as the DASH diet and exercise as tolerated.  °

## 2017-01-08 NOTE — Assessment & Plan Note (Signed)
Tolerating statin, encouraged heart healthy diet, avoid trans fats, minimize simple carbs and saturated fats. Increase exercise as tolerated 

## 2017-01-08 NOTE — Assessment & Plan Note (Signed)
hgba1c acceptable, minimize simple carbs. Increase exercise as tolerated. Continue current meds 

## 2017-01-08 NOTE — Assessment & Plan Note (Signed)
Encouraged daily supplements 

## 2017-01-11 ENCOUNTER — Ambulatory Visit (AMBULATORY_SURGERY_CENTER): Payer: Medicare Other | Admitting: Internal Medicine

## 2017-01-11 ENCOUNTER — Other Ambulatory Visit: Payer: Self-pay

## 2017-01-11 ENCOUNTER — Encounter: Payer: Self-pay | Admitting: Internal Medicine

## 2017-01-11 VITALS — BP 146/64 | HR 63 | Temp 98.0°F | Resp 16 | Ht 60.0 in | Wt 169.0 lb

## 2017-01-11 DIAGNOSIS — K317 Polyp of stomach and duodenum: Secondary | ICD-10-CM

## 2017-01-11 DIAGNOSIS — Z8719 Personal history of other diseases of the digestive system: Secondary | ICD-10-CM | POA: Diagnosis present

## 2017-01-11 DIAGNOSIS — R1310 Dysphagia, unspecified: Secondary | ICD-10-CM | POA: Diagnosis not present

## 2017-01-11 MED ORDER — SODIUM CHLORIDE 0.9 % IV SOLN: 500.0000 mL | INTRAVENOUS | Status: DC

## 2017-01-11 NOTE — Progress Notes (Signed)
To PACU, VSS. Report to [email or web address] 

## 2017-01-11 NOTE — Op Note (Signed)
[facility] Patient Name: [name] Procedure Date: 01/11/2017 9:54 AM MRN: [id number] Endoscopist: Gatha Mayer , MD Age: 76 Referring MD:  Date of Birth: 1941-01-09 Gender: Female Account #: [id number] Procedure:                Upper GI endoscopy Indications:              Follow-up of gastric polyps and prior carcinoid Medicines:                Propofol per Anesthesia, Monitored Anesthesia Care Procedure:                Pre-Anesthesia Assessment:                           - Prior to the procedure, a History and Physical                            was performed, and patient medications and                            allergies were reviewed. The patient's tolerance of                            previous anesthesia was also reviewed. The risks                            and benefits of the procedure and the sedation                            options and risks were discussed with the patient.                            All questions were answered, and informed consent                            was obtained. Prior Anticoagulants: The patient has                            taken no previous anticoagulant or antiplatelet                            agents. ASA Grade Assessment: III - A patient with                            severe systemic disease. After reviewing the risks                            and benefits, the patient was deemed in                            satisfactory condition to undergo the procedure.                           After obtaining informed consent, the endoscope was  passed under direct vision. Throughout the                            procedure, the patient's blood pressure, pulse, and                            oxygen saturations were monitored continuously. The                            Endoscope was introduced through the mouth, and                            advanced to the second part of duodenum. The upper                          GI endoscopy was accomplished without difficulty.                            The patient tolerated the procedure well. Scope In: Scope Out: Findings:                 Multiple diminutive semi-sessile polyps were found                            in the gastric fundus and in the gastric body.                           The exam was otherwise without abnormality. (Patchy                            erythema in antrum)                           The cardia and gastric fundus were otherwisenormal                            on retroflexion. Complications:            No immediate complications. Estimated Blood Loss:     Estimated blood loss: none. Impression:               - Multiple gastric polyps. Consistent with known                            fundic gland polyps. Nothing suspicious seen today.                           - The examination was otherwise normal.                           - No specimens collected. Recommendation:           - Patient has a contact number available for                            emergencies. The signs and symptoms of potential  delayed complications were discussed with the                            patient. Return to normal activities tomorrow.                            Written discharge instructions were provided to the                            patient.                           - Resume previous diet.                           - Continue present medications.                           - Consider repeat EGD in 1 year Gatha Mayer, MD 01/11/2017 10:17:12 AM This report has been signed electronically.

## 2017-01-11 NOTE — Progress Notes (Signed)
Pt's states no medical or surgical changes since previsit or office visit. 

## 2017-01-11 NOTE — Patient Instructions (Addendum)
Nothing suspicious seen today.  We can talk about things again in 1 year.  I appreciate the opportunity to care for you. Gatha Mayer, MD, FACG  YOU HAD AN ENDOSCOPIC PROCEDURE TODAY AT [facility]:   Refer to the procedure report that was given to you for any specific questions about what was found during the examination.  If the procedure report does not answer your questions, please call your gastroenterologist to clarify.  If you requested that your care partner not be given the details of your procedure findings, then the procedure report has been included in a sealed envelope for you to review at your convenience later.  YOU SHOULD EXPECT: Some feelings of bloating in the abdomen. Passage of more gas than usual.  Walking can help get rid of the air that was put into your GI tract during the procedure and reduce the bloating. If you had a lower endoscopy (such as a colonoscopy or flexible sigmoidoscopy) you may notice spotting of blood in your stool or on the toilet paper. If you underwent a bowel prep for your procedure, you may not have a normal bowel movement for a few days.  Please Note:  You might notice some irritation and congestion in your nose or some drainage.  This is from the oxygen used during your procedure.  There is no need for concern and it should clear up in a day or so.  SYMPTOMS TO REPORT IMMEDIATELY:   Following lower endoscopy (colonoscopy or flexible sigmoidoscopy):  Excessive amounts of blood in the stool  Significant tenderness or worsening of abdominal pains  Swelling of the abdomen that is new, acute  Fever of 100F or higher   Following upper endoscopy (EGD)  Vomiting of blood or coffee ground material  New chest pain or pain under the shoulder blades  Painful or persistently difficult swallowing  New shortness of breath  Fever of 100F or higher  Black, tarry-looking stools  For urgent or emergent issues, a  gastroenterologist can be reached at any hour by calling [phone].   DIET:  We do recommend a small meal at first, but then you may proceed to your regular diet.  Drink plenty of fluids but you should avoid alcoholic beverages for 24 hours.  ACTIVITY:  You should plan to take it easy for the rest of today and you should NOT DRIVE or use heavy machinery until tomorrow (because of the sedation medicines used during the test).    FOLLOW UP: Our staff will call the number listed on your records the next business day following your procedure to check on you and address any questions or concerns that you may have regarding the information given to you following your procedure. If we do not reach you, we will leave a message.  However, if you are feeling well and you are not experiencing any problems, there is no need to return our call.  We will assume that you have returned to your regular daily activities without incident.  If any biopsies were taken you will be contacted by phone or by letter within the next 1-3 weeks.  Please call us at [phone] if you have not heard about the biopsies in 3 weeks.    SIGNATURES/CONFIDENTIALITY: You and/or your care partner have signed paperwork which will be entered into your electronic medical record.  These signatures attest to the fact that that the information above on your After Visit Summary has been reviewed and  is understood.  Full responsibility of the confidentiality of this discharge information lies with you and/or your care-partner.  Consider recall one year-2019

## 2017-01-12 ENCOUNTER — Telehealth: Payer: Self-pay | Admitting: *Deleted

## 2017-01-12 ENCOUNTER — Telehealth: Payer: Self-pay | Admitting: Family Medicine

## 2017-01-12 ENCOUNTER — Telehealth: Payer: Self-pay

## 2017-01-12 NOTE — Telephone Encounter (Signed)
  Follow up Call-  Call [name] number 01/11/2017 11/29/2015 10/26/2014  Post procedure Call [name] phone  # [phone]  Permission to leave phone message Yes Yes Yes  Some recent data might be hidden     Patient questions:  Do you have a fever, pain , or abdominal swelling? No. Pain Score  0 *  Have you tolerated food without any problems? Yes.    Have you been able to return to your normal activities? Yes.    Do you have any questions about your discharge instructions: Diet   No. Medications  No. Follow up visit  No.  Do you have questions or concerns about your Care? No.  Actions: * If pain score is 4 or above: No action needed, pain <4.

## 2017-01-12 NOTE — Telephone Encounter (Signed)
  Follow up Call-  Call back number 01/11/2017 11/29/2015 10/26/2014  Post procedure Call Back phone  # [phone]  Permission to leave phone message Yes Yes Yes  Some recent data might be hidden     Patient questions:  Message left to call us if necessary.

## 2017-01-12 NOTE — Telephone Encounter (Signed)
Copied from [place]. Topic: Quick Communication - Rx Refill/Question >> Jan 12, 2017 10:38 AM Arletha Grippe wrote: Has the patient contacted their pharmacy? Yes.     (Agent: If no, request that the patient contact the pharmacy for the refill.)   Preferred Pharmacy (with phone number or street name): [place], [place] [phone] (Phone) [phone] (Fax) pt needs renewal rx for test strips, pt is out. Pharmacy told pt to call. Pt number is [phone] (     Agent: Please be advised that RX refills may take up to 3 business days. We ask that you follow-up with your pharmacy.

## 2017-01-17 MED ORDER — GLUCOSE BLOOD VI STRP
ORAL_STRIP | 3 refills | Status: DC
Start: 2017-01-17 — End: 2017-01-18

## 2017-01-17 NOTE — Telephone Encounter (Signed)
Refill request for blood glucose test strips. / LOV 01/08/17 with Dr. Charlett Blake /  Disp Refills Start End   glucose blood test strip 100 each 3 01/17/2017    Sig: Check twice daily and as needed. DX E11.9   Sent to pharmacy as: glucose blood test strip   E-Prescribing Status: Receipt confirmed by pharmacy (01/17/2017 11:17 AM EST)

## 2017-01-18 ENCOUNTER — Other Ambulatory Visit: Payer: Self-pay | Admitting: Hematology

## 2017-01-18 MED ORDER — GLUCOSE BLOOD VI STRP: ORAL_STRIP | 3 refills | Status: DC

## 2017-01-18 NOTE — Addendum Note (Signed)
Addended by: Kendrick Ranch on: 01/18/2017 01:57 PM   Modules accepted: Orders

## 2017-01-18 NOTE — Telephone Encounter (Signed)
While trying to reorder glucose strips to new pharmacy, accidentally discontinued order.  Reordered and sent to correct pharmacy per patients request.

## 2017-01-18 NOTE — Telephone Encounter (Addendum)
Relation to pt: self Call back number: [phone] Pharmacy:  [facility] phone #[phone] fax #   Reason for call:  Patient would like test strips sent to, patient states she's completely out and would like Rx sent in today, please advise   *[facility] did confirm will take verbal order

## 2017-01-18 NOTE — Telephone Encounter (Signed)
Rx for glucose strips filled on yesterday and [facility] / Patient wanted prescriptions sent to [facility].  Agent transferred call to me with an representative from [facility] on the line. / Verbal order was left for the glucose test strips with this pharmacy   Disp Refills Start End   glucose blood test strip 100 each 3 01/17/2017      /

## 2017-02-09 ENCOUNTER — Telehealth: Payer: Self-pay | Admitting: Family Medicine

## 2017-02-09 NOTE — Telephone Encounter (Signed)
Copied from [place]. Topic: General - Other >> Feb 09, 2017 12:19 PM Darl Householder, RMA wrote: Reason for CRM: [facility] is calling requesting a prescription for Diabetic supplies, meter, lancets, and test strips, to be sent to [facility]

## 2017-02-12 ENCOUNTER — Other Ambulatory Visit: Payer: Self-pay | Admitting: Family Medicine

## 2017-02-13 ENCOUNTER — Telehealth: Payer: Self-pay | Admitting: Family Medicine

## 2017-02-13 DIAGNOSIS — E119 Type 2 diabetes mellitus without complications: Secondary | ICD-10-CM | POA: Diagnosis not present

## 2017-02-13 DIAGNOSIS — H04123 Dry eye syndrome of bilateral lacrimal glands: Secondary | ICD-10-CM | POA: Diagnosis not present

## 2017-02-13 NOTE — Telephone Encounter (Signed)
Please see request for refill on Losartan; Dr. Charlett Blake changed the dose at [facility] on 01/08/17, but only ordered enough for one month.  I am sending this to the office to determine if this should be refilled, or if pt. would need a recheck to determine if change in dose was effective.

## 2017-02-13 NOTE — Telephone Encounter (Signed)
Copied from [place] [phone]. Topic: Quick Communication - Rx Refill/Question >> Feb 13, 2017 10:27 AM Malena Catholic I, NT wrote: Has the patient contacted their pharmacy yes    (Agent: If no, request that the patient contact the pharmacy for the refill Cozaar 50 Mg   Preferred Pharmacy (with phone number or street name [facility] [phone]  Agent: Please be advised that RX refills may take up to 3 business days. We ask that you follow-up with your pharmacy.

## 2017-02-13 NOTE — Telephone Encounter (Signed)
Per Dr. lab notes she will recheck her in 3 months continue the medication ok to refill.

## 2017-02-14 MED FILL — LOSARTAN POTASSIUM 50 MG TA: 50 | 90 days supply | Qty: 180 | Fill #0

## 2017-02-16 NOTE — Telephone Encounter (Signed)
Medication sent into pharmacy  

## 2017-02-19 ENCOUNTER — Ambulatory Visit (HOSPITAL_BASED_OUTPATIENT_CLINIC_OR_DEPARTMENT_OTHER): Payer: Medicare Other

## 2017-02-21 ENCOUNTER — Ambulatory Visit (HOSPITAL_BASED_OUTPATIENT_CLINIC_OR_DEPARTMENT_OTHER)
Admission: RE | Admit: 2017-02-21 | Discharge: 2017-02-21 | Disposition: A | Discharge status: Home / Self Care | Payer: Medicare Other | Source: Ambulatory Visit | Attending: Family Medicine | Admitting: Family Medicine

## 2017-02-21 DIAGNOSIS — Z1231 Encounter for screening mammogram for malignant neoplasm of breast: Secondary | ICD-10-CM | POA: Diagnosis not present

## 2017-03-02 ENCOUNTER — Other Ambulatory Visit: Payer: Self-pay | Admitting: Family Medicine

## 2017-04-06 ENCOUNTER — Other Ambulatory Visit (INDEPENDENT_AMBULATORY_CARE_PROVIDER_SITE_OTHER): Payer: Medicare Other

## 2017-04-06 DIAGNOSIS — E559 Vitamin D deficiency, unspecified: Secondary | ICD-10-CM

## 2017-04-06 DIAGNOSIS — E7800 Pure hypercholesterolemia, unspecified: Secondary | ICD-10-CM | POA: Diagnosis not present

## 2017-04-06 DIAGNOSIS — I10 Essential (primary) hypertension: Secondary | ICD-10-CM | POA: Diagnosis not present

## 2017-04-06 DIAGNOSIS — E1169 Type 2 diabetes mellitus with other specified complication: Secondary | ICD-10-CM | POA: Diagnosis not present

## 2017-04-06 DIAGNOSIS — E669 Obesity, unspecified: Secondary | ICD-10-CM | POA: Diagnosis not present

## 2017-04-06 LAB — LIPID PANEL
CHOL/HDL RATIO: 3
Cholesterol: 186 mg/dL (ref 0–200)
HDL: 63.7 mg/dL (ref >39.00)
LDL CALC: 100 mg/dL — AB (ref 0–99)
NonHDL: 122.42
Triglycerides: 112 mg/dL (ref 0.0–149.0)
VLDL: 22.4 mg/dL (ref 0.0–40.0)

## 2017-04-06 LAB — HEMOGLOBIN A1C: Hgb A1c MFr Bld: 6.4 % (ref 4.6–6.5)

## 2017-04-06 LAB — COMPREHENSIVE METABOLIC PANEL
ALT: 25 U/L (ref 0–35)
AST: 22 U/L (ref 0–37)
Albumin: 3.9 g/dL (ref 3.5–5.2)
Alkaline Phosphatase: 51 U/L (ref 39–117)
BUN: 16 mg/dL (ref 6–23)
CHLORIDE: 98 meq/L (ref 96–112)
CO2: 26 meq/L (ref 19–32)
CREATININE: 0.65 mg/dL (ref 0.40–1.20)
Calcium: 10.1 mg/dL (ref 8.4–10.5)
GFR: 94.19 mL/min (ref >60.00)
Glucose, Bld: 120 mg/dL — ABNORMAL HIGH (ref 70–99)
POTASSIUM: 4 meq/L (ref 3.5–5.1)
SODIUM: 134 meq/L — AB (ref 135–145)
Total Bilirubin: 0.7 mg/dL (ref 0.2–1.2)
Total Protein: 7.2 g/dL (ref 6.0–8.3)

## 2017-04-06 LAB — CBC
HEMATOCRIT: 38.5 % (ref 36.0–46.0)
Hemoglobin: 13.1 g/dL (ref 12.0–15.0)
MCHC: 34.1 g/dL (ref 30.0–36.0)
MCV: 85 fl (ref 78.0–100.0)
Platelets: 223 10*3/uL (ref 150.0–400.0)
RBC: 4.53 Mil/uL (ref 3.87–5.11)
RDW: 12.5 % (ref 11.5–15.5)
WBC: 7.7 10*3/uL (ref 4.0–10.5)

## 2017-04-06 LAB — TSH: TSH: 1.98 u[IU]/mL (ref 0.35–4.50)

## 2017-04-06 LAB — VITAMIN D 25 HYDROXY (VIT D DEFICIENCY, FRACTURES): VITD: 38.71 ng/mL (ref 30.00–100.00)

## 2017-04-09 ENCOUNTER — Ambulatory Visit (INDEPENDENT_AMBULATORY_CARE_PROVIDER_SITE_OTHER): Payer: Medicare Other | Admitting: Family Medicine

## 2017-04-09 DIAGNOSIS — G459 Transient cerebral ischemic attack, unspecified: Secondary | ICD-10-CM

## 2017-04-09 DIAGNOSIS — N390 Urinary tract infection, site not specified: Secondary | ICD-10-CM | POA: Diagnosis not present

## 2017-04-09 DIAGNOSIS — E871 Hypo-osmolality and hyponatremia: Secondary | ICD-10-CM

## 2017-04-09 DIAGNOSIS — E1169 Type 2 diabetes mellitus with other specified complication: Secondary | ICD-10-CM

## 2017-04-09 DIAGNOSIS — I10 Essential (primary) hypertension: Secondary | ICD-10-CM | POA: Diagnosis not present

## 2017-04-09 DIAGNOSIS — E559 Vitamin D deficiency, unspecified: Secondary | ICD-10-CM | POA: Diagnosis not present

## 2017-04-09 DIAGNOSIS — E669 Obesity, unspecified: Secondary | ICD-10-CM

## 2017-04-09 DIAGNOSIS — E7800 Pure hypercholesterolemia, unspecified: Secondary | ICD-10-CM

## 2017-04-09 MED ORDER — GLUCOSE BLOOD VI STRP: ORAL_STRIP | 3 refills | Status: DC

## 2017-04-09 MED ORDER — LANCETS MISC: 2 refills | Status: DC

## 2017-04-09 NOTE — Assessment & Plan Note (Signed)
Recurrent urinary symptoms check urinalysis

## 2017-04-09 NOTE — Assessment & Plan Note (Signed)
Normal level but normal. Continue daily

## 2017-04-09 NOTE — Assessment & Plan Note (Signed)
10  Minutes of confusion and getting lost a about a month ago she declines referral at his time to neurology. She will let us know if she wants the appt.

## 2017-04-09 NOTE — Assessment & Plan Note (Signed)
hgba1c acceptable, minimize simple carbs. Increase exercise as tolerated. Continue current meds 

## 2017-04-09 NOTE — Progress Notes (Signed)
Subjective:  I acted as a Education administrator for Dr. Charlett Blake. [name], [place]  [name], female    DOB: [birth year], 77 y.o.   MRN: [id number]  No chief complaint on file.   HPI  Patient is in today for a 3 month follow up and she feels well today. She describes one episode of becoming disoriented while driving home from her sisters house and having trouble finding her way home. She denies any other symptoms she had not skipped a meal. She does acknowledge increased stress secondary to her brother in law's diagnosis of cancer. No headache or other neurologic complaints. Denies CP/palp/SOB/HA/congestion/fevers/GI or GU c/o. Taking meds as prescribed  Patient Care Team: [name], MD as PCP - General (Family Medicine) Gatha Mayer, MD as Consulting Physician (Gastroenterology) Love, Alyson Locket, MD (Neurology) Minus Breeding, MD as Attending Physician (Cardiology)   Past Medical History:  Diagnosis Date  . Abdominal pain in female 03/18/2010   Qualifier: Diagnosis of  By: Carlean Purl MD, Dimas Millin Anemia 06/08/2014  . Anxiety   . Arthritis    Spinal Osteoarthritis  . Cancer ([place])   . Carcinoid tumor of stomach   . Cataract   . Chest pain    Myoview 12/15 no ischemia.  . Chronic kidney disease    Left kidney smaller than right kidney  . Constipation 11/21/2016  . Diabetes mellitus type 2 in obese ([place]) 09/05/2006   Qualifier: Diagnosis of  By: Marca Ancona RMA, Lucy    . Diabetic peripheral neuropathy ([place]) 10/29/2013  . Diverticulosis 08/30/2000   Colonoscopy   . Encounter for preventative adult health care exam with abnormal findings 09/14/2013  . Esophageal reflux   . Gastric polyp    Fundic Gland  . Gastroparesis   . Headache(784.0)   . Heart murmur    Echocardiogram 2/11: EF 60-65%, mild LAE, grade 1 diastolic dysfunction, aortic valve sclerosis, mean gradient 9 mm of mercury, PASP 34  . Hematuria 03/16/2016  . Iron deficiency anemia, unspecified   . 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 Leg swelling    bilateral  . Neck pain 04/22/2015  . PONV (postoperative nausea and vomiting)    pt states only needs small amount of anesthesia  . PSVT (paroxysmal supraventricular tachycardia) ([place])   . Pure hypercholesterolemia   . 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 Stroke ([place])    tia, 2014  . 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 Type II or unspecified type diabetes mellitus without mention of complication, not stated as uncontrolled   . Unspecified essential hypertension   . Unspecified hereditary and idiopathic peripheral neuropathy 10/29/2013    Past Surgical History:  Procedure Laterality Date  . CHOLECYSTECTOMY  1993  . COLONOSCOPY  11/11/2010   diverticulosis  . DILATATION & CURRETTAGE/HYSTEROSCOPY WITH RESECTOCOPE N/A 02/25/2013   Procedure: Attempted hysteroscopy with uterine perforation;  Surgeon: Jamey Reas de Berton Lan, MD;  Location: [facility];  Service: Gynecology;  Laterality: N/A;  . ESOPHAGOGASTRODUODENOSCOPY  08/29/2010; 09/15/2010   Carcinoid tumor less than 1 cm in July 2012 not seen in August 2012 , gastritis, fundic gland polyps  . ESOPHAGOGASTRODUODENOSCOPY  05/16/2011  . ESOPHAGOGASTRODUODENOSCOPY  06/14/2012  . EUS  12/15/2010   Procedure: UPPER ENDOSCOPIC ULTRASOUND (EUS) LINEAR;  Surgeon: Owens Loffler, MD;  Location: [facility];  Service: Endoscopy;  Laterality: N/A;  . EYE SURGERY Bilateral    Bi lateral cateracts and bi lateral laser  . LAPAROSCOPY N/A 02/25/2013  Procedure: Cystoscopy and laparoscopy with fulguration of uterine serosa;  Surgeon: Jamey Reas de Berton Lan, MD;  Location: [facility];  Service: Gynecology;  Laterality: N/A;  . TONSILLECTOMY      Family History  Problem Relation Age of Onset  . Diabetes Mother   . Stroke Father        deceased age 10  . Heart disease Sister        deceased MI age 61  . Heart disease Brother        deceased MI age 35  . Diabetes Maternal Grandmother   . Hypertension Paternal  Grandmother   . Diabetes Sister   . Heart disease Sister   . Hypertension Sister   . Hyperlipidemia Sister   . Diabetes Sister   . Heart disease Sister   . Hypertension Sister   . Hyperlipidemia Sister   . Diabetes Brother   . Heart disease Brother   . Hypertension Brother   . Hyperlipidemia Brother   . Colon cancer Neg Hx   . Esophageal cancer Neg Hx   . Stomach cancer Neg Hx   . Rectal cancer Neg Hx     Social History   Socioeconomic History  . Marital status: Married    Spouse name: Not on file  . Number of children: 0  . Years of education: college  . Highest education level: Not on file  Social Needs  . Financial resource strain: Not on file  . Food insecurity - worry: Not on file  . Food insecurity - inability: Not on file  . Transportation needs - medical: Not on file  . Transportation needs - non-medical: Not on file  Occupational History  . Occupation: Retail  Tobacco Use  . Smoking status: Never Smoker  . Smokeless tobacco: Never Used  . Tobacco comment: Never used tobacco  Substance and Sexual Activity  . Alcohol use: No    Alcohol/week: 0.0 oz  . Drug use: No  . Sexual activity: No    Partners: Male    Birth control/protection: Post-menopausal    Comment: lives alone, no dietary restrictions except avoid fresh veg, fruit, whole grains  Other Topics Concern  . Not on file  Social History Narrative   Patient was married ([name]) - widow   Patient does not have any children.   Patient is right-handed.   Patient has a BA degree.   One caffeine drink daily     Outpatient Medications Prior to Visit  Medication Sig Dispense Refill  . ALPRAZolam (XANAX) 0.25 MG tablet Take 1 tablet (0.25 mg total) by mouth 2 (two) times daily as needed for anxiety. 30 tablet 1  . amLODipine (NORVASC) 5 MG tablet TAKE 1 TABLET BY MOUTH  DAILY 90 tablet 3  . aspirin 81 MG tablet Take 81 mg by mouth daily.    . cholecalciferol (VITAMIN D) 1000 UNITS tablet Take 1,000  Units by mouth daily.      Marland Kitchen dicyclomine (BENTYL) 10 MG capsule Take 1 capsule (10 mg total) by mouth 4 (four) times daily as needed. 120 capsule 6  . esomeprazole (NEXIUM) 40 MG capsule Take 1 capsule (40 mg total) by mouth daily before breakfast. 30 capsule 11  . FOLBIC 2.5-25-2 MG TABS tablet TAKE 1 TABLET BY MOUTH EVERY DAY 90 tablet 3  . losartan (COZAAR) 50 MG tablet Take 1 tablet (50 mg total) by mouth 2 (two) times daily. 180 tablet 0  . metFORMIN (GLUCOPHAGE-XR) 500 MG 24 hr  tablet TAKE 1 TABLET BY MOUTH 3  TIMES DAILY WITH MEALS 270 tablet 0  . metoCLOPramide (REGLAN) 5 MG tablet TAKE 1 TABLET BY MOUTH TWO  TIMES DAILY 180 tablet 1  . metoprolol tartrate (LOPRESSOR) 50 MG tablet Take 1.5 tablets (75 mg total) by mouth 2 (two) times daily. 225 tablet 2  . Multiple Vitamin (MULTIVITAMIN) tablet Take 1 tablet by mouth daily.      . Multiple Vitamins-Calcium (VIACTIV MULTI-VITAMIN) CHEW Chew 2 each by mouth daily.    . ondansetron (ZOFRAN) 4 MG tablet Take 4 mg by mouth every 4 (four) hours as needed. For nausea      . potassium chloride (K-DUR,KLOR-CON) 10 MEQ tablet TAKE 1 TABLET BY MOUTH  DAILY 90 tablet 2  . rosuvastatin (CRESTOR) 10 MG tablet TAKE 1 TABLET BY MOUTH  DAILY 90 tablet 2  . acetaminophen (TYLENOL) 500 MG tablet Take 2 tablets (1,000 mg total) by mouth every 6 (six) hours as needed for moderate pain. 30 tablet 0  . Lancets MISC Use twice daily to check blood sugar. DX E11.9 200 each 2  . glucose blood test strip Use as directed once daily to check blood sugar.  DX E11.9 100 each 3  . 0.9 %  sodium chloride infusion      No facility-administered medications prior to visit.     Allergies  Allergen Reactions  . Tramadol Other (See Comments)    Dizziness     Review of Systems  Constitutional: Negative for fever and malaise/fatigue.  HENT: Negative for congestion.   Eyes: Negative for blurred vision.  Respiratory: Negative for shortness of breath.   Cardiovascular:  Negative for chest pain, palpitations and leg swelling.  Gastrointestinal: Negative for abdominal pain, blood in stool and nausea.  Genitourinary: Negative for dysuria and frequency.  Musculoskeletal: Negative for falls.  Skin: Negative for rash.  Neurological: Negative for dizziness, loss of consciousness and headaches.  Endo/Heme/Allergies: Negative for environmental allergies.  Psychiatric/Behavioral: Positive for memory loss. Negative for depression. The patient is not nervous/anxious.        Objective:    Physical Exam  Constitutional: She is oriented to person, place, and time. She appears well-developed and well-nourished. No distress.  HENT:  Head: Normocephalic and atraumatic.  Nose: Nose normal.  Eyes: Right eye exhibits no discharge. Left eye exhibits no discharge.  Neck: Normal range of motion. Neck supple.  Cardiovascular: Normal rate and regular rhythm.  No murmur heard. Pulmonary/Chest: Effort normal and breath sounds normal.  Abdominal: Soft. Bowel sounds are normal. There is no tenderness.  Musculoskeletal: She exhibits no edema.  Neurological: She is alert and oriented to person, place, and time.  Skin: Skin is warm and dry.  Psychiatric: She has a normal mood and affect.  Nursing note and vitals reviewed.   BP 118/60 (BP Location: Left Arm, Patient Position: Sitting, Cuff Size: Normal)   Pulse 69   Temp 97.7 F (36.5 C) (Oral)   Resp 18   Wt 172 lb 6.4 oz (78.2 kg)   LMP 02/07/1992   SpO2 97%   BMI 33.67 kg/m  Wt Readings from Last 3 Encounters:  04/09/17 172 lb 6.4 oz (78.2 kg)  01/11/17 169 lb (76.7 kg)  01/08/17 171 lb (77.6 kg)   BP Readings from Last 3 Encounters:  04/09/17 118/60  01/11/17 (!) 146/64  01/08/17 (!) 118/58     Immunization History  Administered Date(s) Administered  . Influenza Split 10/31/2010, 11/28/2011  . Influenza Whole 11/19/2007,  11/04/2008, 11/10/2009  . Influenza, High Dose Seasonal PF 11/11/2015, 11/21/2016  .  Influenza,inj,Quad PF,6+ Mos 01/09/2013, 10/20/2013, 11/13/2014  . Pneumococcal Conjugate-13 09/11/2013  . Pneumococcal Polysaccharide-23 03/13/2007  . Td 03/02/2005    Health Maintenance  Topic Date Due  . TETANUS/TDAP  03/03/2015  . FOOT EXAM  11/13/2015  . OPHTHALMOLOGY EXAM  02/06/2017  . HEMOGLOBIN A1C  10/07/2017  . INFLUENZA VACCINE  Completed  . DEXA SCAN  Completed  . PNA vac Low Risk Adult  Completed    Lab Results  Component Value Date   WBC 7.7 04/06/2017   HGB 13.1 04/06/2017   HCT 38.5 04/06/2017   PLT 223.0 04/06/2017   GLUCOSE 120 (H) 04/06/2017   CHOL 186 04/06/2017   TRIG 112.0 04/06/2017   HDL 63.70 04/06/2017   LDLDIRECT 123.7 04/13/2011   LDLCALC 100 (H) 04/06/2017   ALT 25 04/06/2017   AST 22 04/06/2017   NA 134 (L) 04/06/2017   K 4.0 04/06/2017   CL 98 04/06/2017   CREATININE 0.65 04/06/2017   BUN 16 04/06/2017   CO2 26 04/06/2017   TSH 1.98 04/06/2017   INR 0.88 01/16/2011   HGBA1C 6.4 04/06/2017   MICROALBUR 1.6 11/13/2014    Lab Results  Component Value Date   TSH 1.98 04/06/2017   Lab Results  Component Value Date   WBC 7.7 04/06/2017   HGB 13.1 04/06/2017   HCT 38.5 04/06/2017   MCV 85.0 04/06/2017   PLT 223.0 04/06/2017   Lab Results  Component Value Date   NA 134 (L) 04/06/2017   K 4.0 04/06/2017   CHLORIDE 101 07/08/2015   CO2 26 04/06/2017   GLUCOSE 120 (H) 04/06/2017   BUN 16 04/06/2017   CREATININE 0.65 04/06/2017   BILITOT 0.7 04/06/2017   ALKPHOS 51 04/06/2017   AST 22 04/06/2017   ALT 25 04/06/2017   PROT 7.2 04/06/2017   ALBUMIN 3.9 04/06/2017   CALCIUM 10.1 04/06/2017   ANIONGAP 8 07/08/2015   EGFR 75 (L) 07/08/2015   GFR 94.19 04/06/2017   Lab Results  Component Value Date   CHOL 186 04/06/2017   Lab Results  Component Value Date   HDL 63.70 04/06/2017   Lab Results  Component Value Date   LDLCALC 100 (H) 04/06/2017   Lab Results  Component Value Date   TRIG 112.0 04/06/2017   Lab Results    Component Value Date   CHOLHDL 3 04/06/2017   Lab Results  Component Value Date   HGBA1C 6.4 04/06/2017         Assessment & Plan:   Problem List Items Addressed This Visit    Diabetes mellitus type 2 in obese ([place])    hgba1c acceptable, minimize simple carbs. Increase exercise as tolerated. Continue current meds      HYPERCHOLESTEROLEMIA    Tolerating statin, encouraged heart healthy diet, avoid trans fats, minimize simple carbs and saturated fats. Increase exercise as tolerated      Essential hypertension    Well controlled, no changes to meds. Encouraged heart healthy diet such as the DASH diet and exercise as tolerated.       TIA (transient ischemic attack)    10  Minutes of confusion and getting lost a about a month ago she declines referral at his time to neurology. She will let us know if she wants the appt.      Vitamin D deficiency    Normal level but normal. Continue daily  Hyponatremia    Very mild, asymptomatic continue to monitor fluid intake, carbs, lab work      Relevant Orders   Comprehensive metabolic panel   Recurrent UTI    Recurrent urinary symptoms check urinalysis         I have discontinued Raeli N. Ledyard's acetaminophen. I am also having her maintain her multivitamin, cholecalciferol, ondansetron, aspirin, esomeprazole, metoCLOPramide, dicyclomine, FOLBIC, VIACTIV MULTI-VITAMIN, ALPRAZolam, amLODipine, metoprolol tartrate, losartan, potassium chloride, metFORMIN, rosuvastatin, glucose blood, and Lancets. We will stop administering sodium chloride.  Meds ordered this encounter  Medications  . glucose blood test strip    Sig: Use as directed once daily to check blood sugar.  DX E11.9    Dispense:  100 each    Refill:  3  . Lancets MISC    Sig: Use twice daily to check blood sugar. DX E11.9    Dispense:  200 each    Refill:  2    CMA served as scribe during this visit. History, Physical and Plan performed by medical  provider. Documentation and orders reviewed and attested to.  Penni Homans, MD

## 2017-04-09 NOTE — Assessment & Plan Note (Signed)
Well controlled, no changes to meds. Encouraged heart healthy diet such as the DASH diet and exercise as tolerated.  °

## 2017-04-09 NOTE — Assessment & Plan Note (Signed)
Tolerating statin, encouraged heart healthy diet, avoid trans fats, minimize simple carbs and saturated fats. Increase exercise as tolerated 

## 2017-04-09 NOTE — Assessment & Plan Note (Signed)
Very mild, asymptomatic continue to monitor fluid intake, carbs, lab work

## 2017-04-09 NOTE — Patient Instructions (Signed)
Transient Ischemic Attack A transient ischemic attack (TIA) is a "warning stroke" that causes stroke-like symptoms that then go away quickly. The symptoms of a TIA come on suddenly, and they last less than 24 hours. Unlike a stroke, a TIA does not cause permanent damage to the brain. It is important to know the symptoms of a TIA and what to do. Seek medical care right away, even if your symptoms go away. Having a TIA is a sign that you are at higher risk for a permanent stroke. Lifestyle changes and medical treatments can help prevent a stroke. What are the causes? This condition is caused by a temporary blockage in an artery in the head or neck. The blockage does not allow the brain to get the blood supply it needs and can cause various symptoms. The blockage can be caused by:  Fatty buildup in an artery in the head or neck (atherosclerosis).  A blood clot.  Tearing of an artery (dissection).  Inflammation of an artery (vasculitis).  Sometimes the cause is not known. What increases the risk? Certain factors may make you more likely to develop this condition. Some of these factors are things that you can change, such as:  Obesity.  Using products that contain nicotine or tobacco, such as cigarettes and e-cigarettes.  Taking oral birth control, especially if you also use tobacco.  Lack of physical inactivity.  Excessive use of alcohol.  Use of drugs, especially cocaine and methamphetamine.  Other risk factors include:  High blood pressure (hypertension).  High cholesterol.  Diabetes mellitus.  Heart disease (coronary artery disease).  Atrial fibrillation.  Being African American or Hispanic.  Being over the age of 60.  Being female.  Family history of stroke.  Previous history of blood clots, stroke, TIA, or heart attack.  Sickle cell disease.  Being a woman with a history of preeclampsia.  Migraine headache.  Sleep apnea.  Chronic inflammatory diseases, such  as rheumatoid arthritis or lupus.  Blood clotting disorders (hypercoagulable state).  What are the signs or symptoms? Symptoms of this condition are the same as those of a stroke, but they are temporary. The symptoms develop suddenly, and they go away quickly, usually within minutes to hours. Symptoms may include sudden:  Weakness or numbness in your face, arm, or leg, especially on one side of your body.  Trouble walking or difficulty moving your arms or legs.  Trouble speaking, understanding speech, or both (aphasia).  Vision changes in one or both eyes. These include double vision, blurred vision, or loss of vision.  Dizziness.  Confusion.  Loss of balance or coordination.  Nausea and vomiting.  Severe headache with no known cause.  If possible, make note of the exact time that you last felt like your normal self and what time your symptoms started. Tell your health care provider. How is this diagnosed? This condition may be diagnosed based on:  Your symptoms and medical history.  A physical exam.  Imaging tests, usually a CT or MRI scan of the brain.  Blood tests.  You may also have other tests, including:  Electrocardiogram (ECG).  Echocardiogram.  Carotid ultrasound.  A scan of the brain circulation (CT angiogram or MRI angiogram).  Continuous heart monitoring.  How is this treated? The goal of treatment is to reduce the risk for a subsequent stroke. Treatment may include stroke prevention therapies such as:  Changes to diet or lifestyle to decrease your risk. Lifestyle changes may include exercising and stopping smoking.    Medicines to thin the blood (antiplatelets or anticoagulants).  Blood pressure medicines.  Medicines to reduce cholesterol.  Treating other health conditions, such as diabetes or atrial fibrillation.  If testing shows that you have narrowing in the arteries to your brain, your health care provider may recommend a procedure, such  as:  Carotid endarterectomy. This is a surgery to remove the blockage from your artery.  Carotid angioplasty and stenting. This is a procedure to open or widen an artery in the neck using a metal mesh tube (stent). The stent helps keep the artery open by supporting the artery walls.  Follow these instructions at home: Medicines  Take over-the-counter and prescription medicines only as told by your health care provider.  If you were told to take a medicine to thin your blood, such as aspirin or an anticoagulant, take it exactly as told by your health care provider. ? Taking too much blood-thinning medicine can cause bleeding. ? If you do not take enough blood-thinning medicine, you will not have the protection that you need against a stroke and other problems. Eating and drinking  Eat 5 or more servings of fruits and vegetables each day.  Follow instructions from your health care provider about diet. You may need to follow a certain nutrition plan to help manage risk factors for stroke, such as high blood pressure, high cholesterol, diabetes, or obesity. This may include: ? Eating a low-fat, low-salt diet. ? Including a lot of fiber in your diet. ? Limiting the amount of carbohydrates and sugar in your diet.  Limit alcohol intake to no more than 1 drink a day for nonpregnant women and 2 drinks a day for men. One drink equals 12 oz of beer, 5 oz of wine, or 1 oz of hard liquor. General instructions  Maintain a healthy weight.  Stay physically active. Try to get at least 30 minutes of exercise on most or all days.  Find out if you have sleep apnea, and seek treatment if needed.  Do not use any products that contain nicotine or tobacco, such as cigarettes and e-cigarettes. If you need help quitting, ask your health care provider.  Do not abuse drugs.  Keep all follow-up visits as told by your health care provider. This is important. Where to find more information:  American Stroke  Association: www.strokeassociation.org  National Stroke Association: www.stroke.org Get help right away if:  You have chest pain or an irregular heartbeat.  You have any symptoms of stroke. The acronym BEFAST is an easy way to remember the main warning signs of stroke. ? B = Balance problems. Signs include dizziness, sudden trouble walking, or loss of balance. ? E = Eye problems. This includes trouble seeing or a sudden change in vision. ? F = Face changes. This includes sudden weakness or numbness of the face, or the face or eyelid drooping to one side. ? A = Arm weakness or numbness. This happens suddenly and usually on one side of the body. ? S = Speech problems. This includes trouble speaking or trouble understanding speech. ? T = Time. Time to call 911 or seek emergency care. Do not wait to see if symptoms will go away. Make note of the time your symptoms started.  Other signs of stroke may include: ? A sudden, severe headache with no known cause. ? Nausea or vomiting. ? Seizure. These symptoms may represent a serious problem that is an emergency. Do not wait to see if the symptoms will go away. Get   medical help right away. Call your local emergency services (911 in the [place] Do not drive yourself to the hospital. Summary  A TIA happens when an artery in the head or neck is blocked, leading to stroke-like symptoms that then go away quickly. The blockage clears before there is any permanent brain damage. A TIA is a medical emergency and requires immediate medical attention.  Symptoms of this condition are the same as those of a stroke, but they are temporary. The symptoms usually develop suddenly, and they go away quickly, usually within minutes to hours.  Having a TIA means that you are at high risk of a stroke in the near future.  Treatment may include medicines to thin the blood as well as medicines, diet changes, and lifestyle changes to manage conditions that increase the risk  of another TIA or a stroke. This information is not intended to replace advice given to you by your health care provider. Make sure you discuss any questions you have with your health care provider. Document Released: 11/02/2004 Document Revised: 03/07/2016 Document Reviewed: 03/07/2016 Elsevier Interactive Patient Education  2018 Elsevier Inc.  

## 2017-04-10 LAB — COMPREHENSIVE METABOLIC PANEL
ALBUMIN: 4.1 g/dL (ref 3.5–5.2)
ALT: 24 U/L (ref 0–35)
AST: 26 U/L (ref 0–37)
Alkaline Phosphatase: 57 U/L (ref 39–117)
BUN: 26 mg/dL — ABNORMAL HIGH (ref 6–23)
CHLORIDE: 100 meq/L (ref 96–112)
CO2: 23 mEq/L (ref 19–32)
Calcium: 9.8 mg/dL (ref 8.4–10.5)
Creatinine, Ser: 0.76 mg/dL (ref 0.40–1.20)
GFR: 78.64 mL/min (ref >60.00)
Glucose, Bld: 132 mg/dL — ABNORMAL HIGH (ref 70–99)
Potassium: 3.8 mEq/L (ref 3.5–5.1)
Sodium: 132 mEq/L — ABNORMAL LOW (ref 135–145)
Total Bilirubin: 0.6 mg/dL (ref 0.2–1.2)
Total Protein: 7.1 g/dL (ref 6.0–8.3)

## 2017-04-11 ENCOUNTER — Other Ambulatory Visit: Payer: Self-pay | Admitting: Internal Medicine

## 2017-04-12 ENCOUNTER — Telehealth: Payer: Self-pay | Admitting: Family Medicine

## 2017-04-12 ENCOUNTER — Other Ambulatory Visit: Payer: Self-pay | Admitting: Family Medicine

## 2017-04-12 NOTE — Telephone Encounter (Signed)
Copied from [place]. Topic: Quick Communication - Rx Refill/Question >> Apr 12, 2017  9:57 AM Robina Ade, Helene Kelp D wrote: Medication: glucose blood test strip and Lancets MISC Has the patient contacted their pharmacy? Yes but pharmacy has questions on how many times patient is testing herself.  (Agent: If no, request that the patient contact the pharmacy for the refill.) Preferred Pharmacy (with phone number or street name): [facility]: Please be advised that RX refills may take up to 3 business days. We ask that you follow-up with your pharmacy.

## 2017-04-12 NOTE — Telephone Encounter (Signed)
Patient called, left VM that glucose test strips and lancets were ordered on 04/09/17 and sent to [facility].

## 2017-04-17 ENCOUNTER — Other Ambulatory Visit: Payer: Self-pay | Admitting: Family Medicine

## 2017-04-24 ENCOUNTER — Encounter: Payer: Self-pay | Admitting: *Deleted

## 2017-04-24 ENCOUNTER — Ambulatory Visit (INDEPENDENT_AMBULATORY_CARE_PROVIDER_SITE_OTHER): Payer: Medicare Other | Admitting: *Deleted

## 2017-04-24 VITALS — BP 120/58 | HR 62 | Ht 60.0 in | Wt 173.4 lb

## 2017-04-24 DIAGNOSIS — Z0000 Encounter for general adult medical examination without abnormal findings: Secondary | ICD-10-CM

## 2017-04-24 MED ORDER — LOSARTAN POTASSIUM 50 MG PO TABS: 50.0000 mg | ORAL_TABLET | Freq: Two times a day (BID) | ORAL | 0 refills | Status: DC

## 2017-04-24 MED FILL — LOSARTAN POTASSIUM 50 MG TA: 50 | 90 days supply | Qty: 180 | Fill #0

## 2017-04-24 NOTE — Progress Notes (Signed)
Subjective:   [name] is a 77 y.o. female who presents for Medicare Annual (Subsequent) preventive examination.  Review of Systems: No ROS.  Medicare Wellness Visit. Additional risk factors are reflected in the social history.  Cardiac Risk Factors include: advanced age (>83men, >52 women);diabetes mellitus;hypertension;obesity (BMI >30kg/m2);sedentary lifestyle Sleep patterns: Sleeps well per pt and naps as needed.  Home Safety/Smoke Alarms: Feels safe in home. Smoke alarms in place.  Living environment; residence and Firearm Safety: no stairs. Lives alone. Seat Belt Safety/Bike Helmet: Wears seat belt.   Female:   Pap- n/a   Mammo- last 02/21/17. BI-RADS CATEGORY  1: Negative.      Dexa scan- last 04/14/13 normal. Pt declines today        CCS- last 11/11/10. Recall 10 yrs  Objective:     Vitals: BP (!) 120/58 (BP Location: Left Arm, Patient Position: Sitting, Cuff Size: Normal)   Pulse 62   Ht 5' (1.524 m)   Wt 173 lb 6.4 oz (78.7 kg)   LMP 02/07/1992   SpO2 97%   BMI 33.86 kg/m   Body mass index is 33.86 kg/m.  Advanced Directives 04/24/2017 11/13/2016 05/08/2016 05/03/2016 01/11/2016 11/15/2015 11/08/2015  Does Patient Have a Medical Advance Directive? No No No No No No No  Would patient like information on creating a medical advance directive? No - Patient declined No - Patient declined - No - Patient declined Yes (MAU/Ambulatory/Procedural Areas - Information given) No - patient declined information No - patient declined information  Pre-existing out of facility DNR order (yellow form or pink MOST form) - - - - - - -    Tobacco Social History   Tobacco Use  Smoking Status Never Smoker  Smokeless Tobacco Never Used  Tobacco Comment   Never used tobacco     Counseling given: Not Answered Comment: Never used tobacco   Clinical Intake:  Pain : No/denies pain   Past Medical History:  Diagnosis Date  . Abdominal pain in female 03/18/2010   Qualifier:  Diagnosis of  By: Carlean Purl MD, Dimas Millin Anemia 06/08/2014  . Anxiety   . Arthritis    Spinal Osteoarthritis  . Cancer ([place])   . Carcinoid tumor of stomach   . Cataract   . Chest pain    Myoview 12/15 no ischemia.  . Chronic kidney disease    Left kidney smaller than right kidney  . Constipation 11/21/2016  . Diabetes mellitus type 2 in obese ([place]) 09/05/2006   Qualifier: Diagnosis of  By: Marca Ancona RMA, Lucy    . Diabetic peripheral neuropathy ([place]) 10/29/2013  . Diverticulosis 08/30/2000   Colonoscopy   . Encounter for preventative adult health care exam with abnormal findings 09/14/2013  . Esophageal reflux   . Gastric polyp    Fundic Gland  . Gastroparesis   . Headache(784.0)   . Heart murmur    Echocardiogram 2/11: EF 60-65%, mild LAE, grade 1 diastolic dysfunction, aortic valve sclerosis, mean gradient 9 mm of mercury, PASP 34  . Hematuria 03/16/2016  . Iron deficiency anemia, unspecified   . 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 Leg swelling    bilateral  . Neck pain 04/22/2015  . PONV (postoperative nausea and vomiting)    pt states only needs small amount of anesthesia  . PSVT (paroxysmal supraventricular tachycardia) ([place])   . Pure hypercholesterolemia   . 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 Stroke ([place])    tia, 2014  . 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  Type II or unspecified type diabetes mellitus without mention of complication, not stated as uncontrolled   . Unspecified essential hypertension   . Unspecified hereditary and idiopathic peripheral neuropathy 10/29/2013   Past Surgical History:  Procedure Laterality Date  . CHOLECYSTECTOMY  1993  . COLONOSCOPY  11/11/2010   diverticulosis  . DILATATION & CURRETTAGE/HYSTEROSCOPY WITH RESECTOCOPE N/A 02/25/2013   Procedure: Attempted hysteroscopy with uterine perforation;  Surgeon: Jamey Reas de Berton Lan, MD;  Location: [facility];  Service: Gynecology;  Laterality: N/A;  . ESOPHAGOGASTRODUODENOSCOPY  08/29/2010; 09/15/2010   Carcinoid  tumor less than 1 cm in July 2012 not seen in August 2012 , gastritis, fundic gland polyps  . ESOPHAGOGASTRODUODENOSCOPY  05/16/2011  . ESOPHAGOGASTRODUODENOSCOPY  06/14/2012  . EUS  12/15/2010   Procedure: UPPER ENDOSCOPIC ULTRASOUND (EUS) LINEAR;  Surgeon: Owens Loffler, MD;  Location: [facility];  Service: Endoscopy;  Laterality: N/A;  . EYE SURGERY Bilateral    Bi lateral cateracts and bi lateral laser  . LAPAROSCOPY N/A 02/25/2013   Procedure: Cystoscopy and laparoscopy with fulguration of uterine serosa;  Surgeon: Jamey Reas de Berton Lan, MD;  Location: [facility];  Service: Gynecology;  Laterality: N/A;  . TONSILLECTOMY     Family History  Problem Relation Age of Onset  . Diabetes Mother   . Stroke Father        deceased age 91  . Heart disease Sister        deceased MI age 37  . Heart disease Brother        deceased MI age 43  . Diabetes Maternal Grandmother   . Hypertension Paternal Grandmother   . Diabetes Sister   . Heart disease Sister   . Hypertension Sister   . Hyperlipidemia Sister   . Diabetes Sister   . Heart disease Sister   . Hypertension Sister   . Hyperlipidemia Sister   . Diabetes Brother   . Heart disease Brother   . Hypertension Brother   . Hyperlipidemia Brother   . Colon cancer Neg Hx   . Esophageal cancer Neg Hx   . Stomach cancer Neg Hx   . Rectal cancer Neg Hx    Social History   Socioeconomic History  . Marital status: Married    Spouse name: None  . Number of children: 0  . Years of education: college  . Highest education level: None  Social Needs  . Financial resource strain: None  . Food insecurity - worry: None  . Food insecurity - inability: None  . Transportation needs - medical: None  . Transportation needs - non-medical: None  Occupational History  . Occupation: Retail  Tobacco Use  . Smoking status: Never Smoker  . Smokeless tobacco: Never Used  . Tobacco comment: Never used tobacco  Substance and Sexual Activity    . Alcohol use: No    Alcohol/week: 0.0 oz  . Drug use: No  . Sexual activity: No    Partners: Male    Birth control/protection: Post-menopausal    Comment: lives alone, no dietary restrictions except avoid fresh veg, fruit, whole grains  Other Topics Concern  . 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   Patient is right-handed.   Patient has a BA degree.   One caffeine drink daily     Outpatient Encounter Medications as of 04/24/2017  Medication Sig  . amLODipine (NORVASC) 5 MG tablet TAKE  1 TABLET BY MOUTH  DAILY  . aspirin 81 MG tablet Take 81 mg by mouth daily.  . cholecalciferol (VITAMIN D) 1000 UNITS tablet Take 1,000 Units by mouth daily.    Marland Kitchen dicyclomine (BENTYL) 10 MG capsule Take 1 capsule (10 mg total) by mouth 4 (four) times daily as needed.  Marland Kitchen esomeprazole (NEXIUM) 40 MG capsule Take 1 capsule (40 mg total) by mouth daily before breakfast.  . FOLBIC 2.5-25-2 MG TABS tablet TAKE 1 TABLET BY MOUTH EVERY DAY  . glucose blood (ONE TOUCH ULTRA TEST) test strip USE 2 TIMES DAILY FOR DIABETIC TESTING  . losartan (COZAAR) 50 MG tablet Take 1 tablet (50 mg total) by mouth 2 (two) times daily.  . metFORMIN (GLUCOPHAGE-XR) 500 MG 24 hr tablet TAKE 1 TABLET BY MOUTH 3  TIMES DAILY WITH MEALS  . metoprolol tartrate (LOPRESSOR) 50 MG tablet Take 1.5 tablets (75 mg total) by mouth 2 (two) times daily.  Marland Kitchen PHARMACIST CHOICE LANCETS MISC USE 2 TIMES DAILY FOR DIABETIC TESTING  . potassium chloride (K-DUR,KLOR-CON) 10 MEQ tablet TAKE 1 TABLET BY MOUTH  DAILY  . rosuvastatin (CRESTOR) 10 MG tablet TAKE 1 TABLET BY MOUTH  DAILY  . ALPRAZolam (XANAX) 0.25 MG tablet Take 1 tablet (0.25 mg total) by mouth 2 (two) times daily as needed for anxiety. (Patient not taking: Reported on 04/24/2017)  . metoCLOPramide (REGLAN) 5 MG tablet TAKE 1 TABLET BY MOUTH TWO  TIMES DAILY (Patient not taking: Reported on 04/24/2017)  . Multiple  Vitamin (MULTIVITAMIN) tablet Take 1 tablet by mouth daily.    . Multiple Vitamins-Calcium (VIACTIV MULTI-VITAMIN) CHEW Chew 2 each by mouth daily.  . ondansetron (ZOFRAN) 4 MG tablet Take 4 mg by mouth every 4 (four) hours as needed. For nausea     No facility-administered encounter medications on file as of 04/24/2017.     Activities of Daily Living In your present state of health, do you have any difficulty performing the following activities: 04/24/2017  Hearing? N  Vision? N  Comment wears glasses for reading. Dr.Digby yealy.  Difficulty concentrating or making decisions? N  Walking or climbing stairs? Y  Dressing or bathing? N  Doing errands, shopping? N  Preparing Food and eating ? N  Using the Toilet? N  In the past six months, have you accidently leaked urine? N  Do you have problems with loss of bowel control? N  Managing your Medications? N  Managing your Finances? N  Housekeeping or managing your Housekeeping? N  Some recent data might be hidden    Patient Care Team: [name], MD as PCP - General (Family Medicine) Gatha Mayer, MD as Consulting Physician (Gastroenterology) Love, Alyson Locket, MD (Neurology) Minus Breeding, MD as Attending Physician (Cardiology)    Assessment:   This is a routine wellness examination for Carolan. Physical assessment deferred to PCP.   Exercise Activities and Dietary recommendations Current Exercise Habits: The patient does not participate in regular exercise at present, Exercise limited by: None identified Diet (meal preparation, eat out, water intake, caffeinated beverages, dairy products, fruits and vegetables): in general, a "healthy" diet  , well balanced, on average, 3 meals per day   Goals    . Weight (lb) < 160 lb (72.6 kg)     Continue eating healthy and begin exercising.        Fall Risk Fall Risk  04/24/2017 04/09/2017 01/11/2016 11/08/2015 07/08/2015  Falls in the past year? No No No No No  Depression  Screen PHQ 2/9 Scores 04/24/2017 04/09/2017 01/11/2016 04/05/2015  PHQ - 2 Score 0 0 0 0  Exception Documentation - - - Patient refusal     Cognitive Function MMSE - Mini Mental State Exam 04/24/2017 01/11/2016  Orientation to time 5 5  Orientation to Place 5 5  Registration 3 3  Attention/ Calculation 5 5  Recall 3 0  Language- name 2 objects 2 2  Language- repeat 1 1  Language- follow 3 step command 3 3  Language- read & follow direction 1 1  Write a sentence 1 1  Copy design 1 1  Total score 30 27        Immunization History  Administered Date(s) Administered  . Influenza Split 10/31/2010, 11/28/2011  . Influenza Whole 11/19/2007, 11/04/2008, 11/10/2009  . Influenza, High Dose Seasonal PF 11/11/2015, 11/21/2016  . Influenza,inj,Quad PF,6+ Mos 01/09/2013, 10/20/2013, 11/13/2014  . Pneumococcal Conjugate-13 09/11/2013  . Pneumococcal Polysaccharide-23 03/13/2007  . Td 03/02/2005    Screening Tests Health Maintenance  Topic Date Due  . TETANUS/TDAP  03/03/2015  . FOOT EXAM  11/13/2015  . OPHTHALMOLOGY EXAM  02/06/2017  . HEMOGLOBIN A1C  10/07/2017  . INFLUENZA VACCINE  Completed  . DEXA SCAN  Completed  . PNA vac Low Risk Adult  Completed      Plan:   Follow up with Dr.Copland as scheduled 07/12/17.   Continue to eat heart healthy diet (full of fruits, vegetables, whole grains, lean protein, water--limit salt, fat, and sugar intake) and increase physical activity as tolerated.  Continue doing brain stimulating activities (puzzles, reading, adult coloring books, staying active) to keep memory sharp.    I have personally reviewed and noted the following in the patient's chart:   . Medical and social history . Use of alcohol, tobacco or illicit drugs  . Current medications and supplements . Functional ability and status . Nutritional status . Physical activity . Advanced directives . List of other physicians . Hospitalizations, surgeries, and ER visits in previous  12 months . Vitals . Screenings to include cognitive, depression, and falls . Referrals and appointments  In addition, I have reviewed and discussed with patient certain preventive protocols, quality metrics, and best practice recommendations. A written personalized care plan for preventive services as well as general preventive health recommendations were provided to patient.     Shela Nevin, [place]  04/24/2017

## 2017-04-24 NOTE — Patient Instructions (Signed)
Follow up with Dr.Copland as scheduled 07/12/17.   Continue to eat heart healthy diet (full of fruits, vegetables, whole grains, lean protein, water--limit salt, fat, and sugar intake) and increase physical activity as tolerated.  Continue doing brain stimulating activities (puzzles, reading, adult coloring books, staying active) to keep memory sharp.    [name] , Thank you for taking time to come for your Medicare Wellness Visit. I appreciate your ongoing commitment to your health goals. Please review the following plan we discussed and let me know if I can assist you in the future.   These are the goals we discussed: Goals    . Weight (lb) < 160 lb (72.6 kg)     Continue eating healthy and begin exercising.        This is a list of the screening recommended for you and due dates:  Health Maintenance  Topic Date Due  . Tetanus Vaccine  03/03/2015  . Complete foot exam   11/13/2015  . Eye exam for diabetics  02/06/2017  . Hemoglobin A1C  10/07/2017  . Flu Shot  Completed  . DEXA scan (bone density measurement)  Completed  . Pneumonia vaccines  Completed     Health Maintenance for Postmenopausal Women Menopause is a normal process in which your reproductive ability comes to an end. This process happens gradually over a span of months to years, usually between the ages of 56 and 41. Menopause is complete when you have missed 12 consecutive menstrual periods. It is important to talk with your health care provider about some of the most common conditions that affect postmenopausal women, such as heart disease, cancer, and bone loss (osteoporosis). Adopting a healthy lifestyle and getting preventive care can help to promote your health and wellness. Those actions can also lower your chances of developing some of these common conditions. What should I know about menopause? During menopause, you may experience a number of symptoms, such as:  Moderate-to-severe hot flashes.  Night  sweats.  Decrease in sex drive.  Mood swings.  Headaches.  Tiredness.  Irritability.  Memory problems.  Insomnia.  Choosing to treat or not to treat menopausal changes is an individual decision that you make with your health care provider. What should I know about hormone replacement therapy and supplements? Hormone therapy products are effective for treating symptoms that are associated with menopause, such as hot flashes and night sweats. Hormone replacement carries certain risks, especially as you become older. If you are thinking about using estrogen or estrogen with progestin treatments, discuss the benefits and risks with your health care provider. What should I know about heart disease and stroke? Heart disease, heart attack, and stroke become more likely as you age. This may be due, in part, to the hormonal changes that your body experiences during menopause. These can affect how your body processes dietary fats, triglycerides, and cholesterol. Heart attack and stroke are both medical emergencies. There are many things that you can do to help prevent heart disease and stroke:  Have your blood pressure checked at least every 1-2 years. High blood pressure causes heart disease and increases the risk of stroke.  If you are 93-5 years old, ask your health care provider if you should take aspirin to prevent a heart attack or a stroke.  Do not use any tobacco products, including cigarettes, chewing tobacco, or electronic cigarettes. If you need help quitting, ask your health care provider.  It is important to eat a healthy diet and maintain  a healthy weight. ? Be sure to include plenty of vegetables, fruits, low-fat dairy products, and lean protein. ? Avoid eating foods that are high in solid fats, added sugars, or salt (sodium).  Get regular exercise. This is one of the most important things that you can do for your health. ? Try to exercise for at least 150 minutes each week.  The type of exercise that you do should increase your heart rate and make you sweat. This is known as moderate-intensity exercise. ? Try to do strengthening exercises at least twice each week. Do these in addition to the moderate-intensity exercise.  Know your numbers.Ask your health care provider to check your cholesterol and your blood glucose. Continue to have your blood tested as directed by your health care provider.  What should I know about cancer screening? There are several types of cancer. Take the following steps to reduce your risk and to catch any cancer development as early as possible. Breast Cancer  Practice breast self-awareness. ? This means understanding how your breasts normally appear and feel. ? It also means doing regular breast self-exams. Let your health care provider know about any changes, no matter how small.  If you are 87 or older, have a clinician do a breast exam (clinical breast exam or CBE) every year. Depending on your age, family history, and medical history, it may be recommended that you also have a yearly breast X-ray (mammogram).  If you have a family history of breast cancer, talk with your health care provider about genetic screening.  If you are at high risk for breast cancer, talk with your health care provider about having an MRI and a mammogram every year.  Breast cancer (BRCA) gene test is recommended for women who have family members with BRCA-related cancers. Results of the assessment will determine the need for genetic counseling and BRCA1 and for BRCA2 testing. BRCA-related cancers include these types: ? Breast. This occurs in males or females. ? Ovarian. ? Tubal. This may also be called fallopian tube cancer. ? Cancer of the abdominal or pelvic lining (peritoneal cancer). ? Prostate. ? Pancreatic.  Cervical, Uterine, and Ovarian Cancer Your health care provider may recommend that you be screened regularly for cancer of the pelvic  organs. These include your ovaries, uterus, and vagina. This screening involves a pelvic exam, which includes checking for microscopic changes to the surface of your cervix (Pap test).  For women ages 21-65, health care providers may recommend a pelvic exam and a Pap test every three years. For women ages 21-65, they may recommend the Pap test and pelvic exam, combined with testing for human papilloma virus (HPV), every five years. Some types of HPV increase your risk of cervical cancer. Testing for HPV may also be done on women of any age who have unclear Pap test results.  Other health care providers may not recommend any screening for nonpregnant women who are considered low risk for pelvic cancer and have no symptoms. Ask your health care provider if a screening pelvic exam is right for you.  If you have had past treatment for cervical cancer or a condition that could lead to cancer, you need Pap tests and screening for cancer for at least 20 years after your treatment. If Pap tests have been discontinued for you, your risk factors (such as having a new sexual partner) need to be reassessed to determine if you should start having screenings again. Some women have medical problems that increase the chance  of getting cervical cancer. In these cases, your health care provider may recommend that you have screening and Pap tests more often.  If you have a family history of uterine cancer or ovarian cancer, talk with your health care provider about genetic screening.  If you have vaginal bleeding after reaching menopause, tell your health care provider.  There are currently no reliable tests available to screen for ovarian cancer.  Lung Cancer Lung cancer screening is recommended for adults 76-76 years old who are at high risk for lung cancer because of a history of smoking. A yearly low-dose CT scan of the lungs is recommended if you:  Currently smoke.  Have a history of at least 30 pack-years of  smoking and you currently smoke or have quit within the past 15 years. A pack-year is smoking an average of one pack of cigarettes per day for one year.  Yearly screening should:  Continue until it has been 15 years since you quit.  Stop if you develop a health problem that would prevent you from having lung cancer treatment.  Colorectal Cancer  This type of cancer can be detected and can often be prevented.  Routine colorectal cancer screening usually begins at age 38 and continues through age 8.  If you have risk factors for colon cancer, your health care provider may recommend that you be screened at an earlier age.  If you have a family history of colorectal cancer, talk with your health care provider about genetic screening.  Your health care provider may also recommend using home test kits to check for hidden blood in your stool.  A small camera at the end of a tube can be used to examine your colon directly (sigmoidoscopy or colonoscopy). This is done to check for the earliest forms of colorectal cancer.  Direct examination of the colon should be repeated every 5-10 years until age 28. However, if early forms of precancerous polyps or small growths are found or if you have a family history or genetic risk for colorectal cancer, you may need to be screened more often.  Skin Cancer  Check your skin from head to toe regularly.  Monitor any moles. Be sure to tell your health care provider: ? About any new moles or changes in moles, especially if there is a change in a mole's shape or color. ? If you have a mole that is larger than the size of a pencil eraser.  If any of your family members has a history of skin cancer, especially at a young age, talk with your health care provider about genetic screening.  Always use sunscreen. Apply sunscreen liberally and repeatedly throughout the day.  Whenever you are outside, protect yourself by wearing long sleeves, pants, a wide-brimmed  hat, and sunglasses.  What should I know about osteoporosis? Osteoporosis is a condition in which bone destruction happens more quickly than new bone creation. After menopause, you may be at an increased risk for osteoporosis. To help prevent osteoporosis or the bone fractures that can happen because of osteoporosis, the following is recommended:  If you are 54-81 years old, get at least 1,000 mg of calcium and at least 600 mg of vitamin D per day.  If you are older than age 87 but younger than age 60, get at least 1,200 mg of calcium and at least 600 mg of vitamin D per day.  If you are older than age 52, get at least 1,200 mg of calcium and at least  800 mg of vitamin D per day.  Smoking and excessive alcohol intake increase the risk of osteoporosis. Eat foods that are rich in calcium and vitamin D, and do weight-bearing exercises several times each week as directed by your health care provider. What should I know about how menopause affects my mental health? Depression may occur at any age, but it is more common as you become older. Common symptoms of depression include:  Low or sad mood.  Changes in sleep patterns.  Changes in appetite or eating patterns.  Feeling an overall lack of motivation or enjoyment of activities that you previously enjoyed.  Frequent crying spells.  Talk with your health care provider if you think that you are experiencing depression. What should I know about immunizations? It is important that you get and maintain your immunizations. These include:  Tetanus, diphtheria, and pertussis (Tdap) booster vaccine.  Influenza every year before the flu season begins.  Pneumonia vaccine.  Shingles vaccine.  Your health care provider may also recommend other immunizations. This information is not intended to replace advice given to you by your health care provider. Make sure you discuss any questions you have with your health care provider. Document Released:  03/17/2005 Document Revised: 08/13/2015 Document Reviewed: 10/27/2014 Elsevier Interactive Patient Education  2018 [employer].

## 2017-05-14 ENCOUNTER — Other Ambulatory Visit: Payer: Self-pay

## 2017-05-14 ENCOUNTER — Inpatient Hospital Stay: Payer: Medicare Other | Attending: Hematology & Oncology | Admitting: Hematology & Oncology

## 2017-05-14 ENCOUNTER — Encounter: Payer: Self-pay | Admitting: Hematology & Oncology

## 2017-05-14 ENCOUNTER — Inpatient Hospital Stay: Payer: Medicare Other

## 2017-05-14 VITALS — BP 134/90 | HR 62 | Temp 98.3°F | Resp 17 | Wt 172.0 lb

## 2017-05-14 DIAGNOSIS — Z79899 Other long term (current) drug therapy: Secondary | ICD-10-CM | POA: Diagnosis not present

## 2017-05-14 DIAGNOSIS — K909 Intestinal malabsorption, unspecified: Secondary | ICD-10-CM | POA: Insufficient documentation

## 2017-05-14 DIAGNOSIS — E119 Type 2 diabetes mellitus without complications: Secondary | ICD-10-CM | POA: Diagnosis not present

## 2017-05-14 DIAGNOSIS — R5381 Other malaise: Secondary | ICD-10-CM | POA: Diagnosis not present

## 2017-05-14 DIAGNOSIS — D508 Other iron deficiency anemias: Secondary | ICD-10-CM

## 2017-05-14 DIAGNOSIS — R5383 Other fatigue: Secondary | ICD-10-CM | POA: Diagnosis not present

## 2017-05-14 DIAGNOSIS — R600 Localized edema: Secondary | ICD-10-CM | POA: Insufficient documentation

## 2017-05-14 DIAGNOSIS — E669 Obesity, unspecified: Secondary | ICD-10-CM

## 2017-05-14 DIAGNOSIS — E871 Hypo-osmolality and hyponatremia: Secondary | ICD-10-CM

## 2017-05-14 DIAGNOSIS — Z7982 Long term (current) use of aspirin: Secondary | ICD-10-CM | POA: Insufficient documentation

## 2017-05-14 DIAGNOSIS — E1169 Type 2 diabetes mellitus with other specified complication: Secondary | ICD-10-CM

## 2017-05-14 DIAGNOSIS — Z7984 Long term (current) use of oral hypoglycemic drugs: Secondary | ICD-10-CM | POA: Diagnosis not present

## 2017-05-14 DIAGNOSIS — D500 Iron deficiency anemia secondary to blood loss (chronic): Secondary | ICD-10-CM

## 2017-05-14 LAB — CMP (CANCER CENTER ONLY)
ALBUMIN: 3.9 g/dL (ref 3.5–5.0)
ALK PHOS: 62 U/L (ref 40–150)
ALT: 28 U/L (ref 0–55)
ANION GAP: 10 (ref 3–11)
AST: 25 U/L (ref 5–34)
BUN: 18 mg/dL (ref 7–26)
CALCIUM: 9.8 mg/dL (ref 8.4–10.4)
CO2: 24 mmol/L (ref 22–29)
Chloride: 100 mmol/L (ref 98–109)
Creatinine: 0.82 mg/dL (ref 0.60–1.10)
GFR, Est AFR Am: >60 mL/min (ref >60)
GFR, Estimated: >60 mL/min (ref >60)
GLUCOSE: 133 mg/dL (ref 70–140)
POTASSIUM: 4.6 mmol/L (ref 3.5–5.1)
SODIUM: 134 mmol/L — AB (ref 136–145)
Total Bilirubin: 0.7 mg/dL (ref 0.2–1.2)
Total Protein: 7.3 g/dL (ref 6.4–8.3)

## 2017-05-14 LAB — CBC WITH DIFFERENTIAL (CANCER CENTER ONLY)
BASOS ABS: 0 10*3/uL (ref 0.0–0.1)
Basophils Relative: 0 %
EOS ABS: 0.1 10*3/uL (ref 0.0–0.5)
EOS PCT: 1 %
HCT: 36.8 % (ref 34.8–46.6)
HEMOGLOBIN: 12.7 g/dL (ref 11.6–15.9)
LYMPHS ABS: 2.7 10*3/uL (ref 0.9–3.3)
Lymphocytes Relative: 33 %
MCH: 29.5 pg (ref 26.0–34.0)
MCHC: 34.5 g/dL (ref 32.0–36.0)
MCV: 85.6 fL (ref 81.0–101.0)
Monocytes Absolute: 0.8 10*3/uL (ref 0.1–0.9)
Monocytes Relative: 10 %
NEUTROS PCT: 56 %
Neutro Abs: 4.6 10*3/uL (ref 1.5–6.5)
PLATELETS: 215 10*3/uL (ref 145–400)
RBC: 4.3 MIL/uL (ref 3.70–5.32)
RDW: 12.3 % (ref 11.1–15.7)
WBC: 8.2 10*3/uL (ref 3.9–10.0)

## 2017-05-14 NOTE — Progress Notes (Signed)
Hematology and Oncology Follow Up Visit  [name] [id number] [birth year] 77 y.o. 05/14/2017   Principle Diagnosis:   Iron deficiency anemia secondary to malabsorption  Current Therapy:    IV iron-patient received a dose back in October 2017     Interim History:  [name] is back for follow-up.  She is feeling tired.  She does not have as much energy.  Is been a year and a half since we gave her any iron.  When we last saw her 6 months ago, her iron studies look pretty good.  Her ferritin was 287.  Her iron saturation was 26%.  She does have diabetes.  She says her last hemoglobin A1c was 6.4.  There is been no bleeding.  She is had no change in bowel or bladder habits.  6 weeks ago, she had her thyroid tested.  Her TSH was 2.  She has had no rashes.  She has had some chronic leg swelling.  Overall, her performance status is ECOG 1.   Medications:  Current Outpatient Medications:  .  ALPRAZolam (XANAX) 0.25 MG tablet, Take 1 tablet (0.25 mg total) by mouth 2 (two) times daily as needed for anxiety. (Patient not taking: Reported on 04/24/2017), Disp: 30 tablet, Rfl: 1 .  amLODipine (NORVASC) 5 MG tablet, TAKE 1 TABLET BY MOUTH  DAILY, Disp: 90 tablet, Rfl: 3 .  aspirin 81 MG tablet, Take 81 mg by mouth daily., Disp: , Rfl:  .  cholecalciferol (VITAMIN D) 1000 UNITS tablet, Take 1,000 Units by mouth daily.  , Disp: , Rfl:  .  dicyclomine (BENTYL) 10 MG capsule, Take 1 capsule (10 mg total) by mouth 4 (four) times daily as needed., Disp: 120 capsule, Rfl: 6 .  esomeprazole (NEXIUM) 40 MG capsule, Take 1 capsule (40 mg total) by mouth daily before breakfast., Disp: 30 capsule, Rfl: 11 .  FOLBIC 2.5-25-2 MG TABS tablet, TAKE 1 TABLET BY MOUTH EVERY DAY, Disp: 90 tablet, Rfl: 1 .  glucose blood (ONE TOUCH ULTRA TEST) test strip, USE 2 TIMES DAILY FOR DIABETIC TESTING, Disp: 100 each, Rfl: 2 .  losartan (COZAAR) 50 MG tablet, Take 1 tablet (50 mg total) by mouth 2 (two)  times daily., Disp: 180 tablet, Rfl: 0 .  metFORMIN (GLUCOPHAGE-XR) 500 MG 24 hr tablet, TAKE 1 TABLET BY MOUTH 3  TIMES DAILY WITH MEALS, Disp: 270 tablet, Rfl: 0 .  metoCLOPramide (REGLAN) 5 MG tablet, TAKE 1 TABLET BY MOUTH TWO  TIMES DAILY (Patient not taking: Reported on 04/24/2017), Disp: 180 tablet, Rfl: 1 .  metoprolol tartrate (LOPRESSOR) 50 MG tablet, Take 1.5 tablets (75 mg total) by mouth 2 (two) times daily., Disp: 225 tablet, Rfl: 2 .  Multiple Vitamin (MULTIVITAMIN) tablet, Take 1 tablet by mouth daily.  , Disp: , Rfl:  .  Multiple Vitamins-Calcium (VIACTIV MULTI-VITAMIN) CHEW, Chew 2 each by mouth daily., Disp: , Rfl:  .  ondansetron (ZOFRAN) 4 MG tablet, Take 4 mg by mouth every 4 (four) hours as needed. For nausea  , Disp: , Rfl:  .  PHARMACIST CHOICE LANCETS MISC, USE 2 TIMES DAILY FOR DIABETIC TESTING, Disp: 100 each, Rfl: 1 .  potassium chloride (K-DUR,KLOR-CON) 10 MEQ tablet, TAKE 1 TABLET BY MOUTH  DAILY, Disp: 90 tablet, Rfl: 2 .  rosuvastatin (CRESTOR) 10 MG tablet, TAKE 1 TABLET BY MOUTH  DAILY, Disp: 90 tablet, Rfl: 2  Allergies:  Allergies  Allergen Reactions  . Tramadol Other (See Comments)    Dizziness  Past Medical History, Surgical history, Social history, and Family History were reviewed and updated.  Review of Systems: Review of Systems  Constitutional: Positive for malaise/fatigue.  HENT: Negative.   Eyes: Negative.   Respiratory: Negative.   Cardiovascular: Negative.   Gastrointestinal: Negative.   Genitourinary: Negative.   Musculoskeletal: Negative.   Skin: Negative.   Neurological: Negative.   Endo/Heme/Allergies: Negative.   Psychiatric/Behavioral: Negative.      Physical Exam:  weight is 172 lb (78 kg). Her oral temperature is 98.3 F (36.8 C). Her blood pressure is 134/90 and her pulse is 62. Her respiration is 17 and oxygen saturation is 97%.   Wt Readings from Last 3 Encounters:  05/14/17 172 lb (78 kg)  04/24/17 173 lb 6.4 oz  (78.7 kg)  04/09/17 172 lb 6.4 oz (78.2 kg)      Physical Exam  Constitutional: She is oriented to person, place, and time.  HENT:  Head: Normocephalic and atraumatic.  Mouth/Throat: Oropharynx is clear and moist.  Eyes: Pupils are equal, round, and reactive to light. EOM are normal.  Neck: Normal range of motion.  Cardiovascular: Normal rate, regular rhythm and normal heart sounds.  Pulmonary/Chest: Effort normal and breath sounds normal.  Abdominal: Soft. Bowel sounds are normal.  Musculoskeletal: Normal range of motion. She exhibits no edema, tenderness or deformity.  Lymphadenopathy:    She has no cervical adenopathy.  Neurological: She is alert and oriented to person, place, and time.  Skin: Skin is warm and dry. No rash noted. No erythema.  Psychiatric: She has a normal mood and affect. Her behavior is normal. Judgment and thought content normal.  Vitals reviewed.    Lab Results  Component Value Date   WBC 8.2 05/14/2017   HGB 13.1 04/06/2017   HCT 36.8 05/14/2017   MCV 85.6 05/14/2017   PLT 215 05/14/2017     Chemistry      Component Value Date/Time   NA 132 (L) 04/09/2017 1439   NA 136 05/08/2016 1305   NA 134 (L) 07/08/2015 1149   K 3.8 04/09/2017 1439   K 4.2 05/08/2016 1305   K 4.4 07/08/2015 1149   CL 100 04/09/2017 1439   CL 102 05/08/2016 1305   CO2 23 04/09/2017 1439   CO2 26 05/08/2016 1305   CO2 25 07/08/2015 1149   BUN 26 (H) 04/09/2017 1439   BUN 16 05/08/2016 1305   BUN 15.5 07/08/2015 1149   CREATININE 0.76 04/09/2017 1439   CREATININE 0.9 05/08/2016 1305   CREATININE 0.8 07/08/2015 1149      Component Value Date/Time   CALCIUM 9.8 04/09/2017 1439   CALCIUM 9.4 05/08/2016 1305   CALCIUM 10.0 07/08/2015 1149   ALKPHOS 57 04/09/2017 1439   ALKPHOS 56 05/08/2016 1305   ALKPHOS 62 07/08/2015 1149   AST 26 04/09/2017 1439   AST 28 05/08/2016 1305   AST 24 07/08/2015 1149   ALT 24 04/09/2017 1439   ALT 29 05/08/2016 1305   ALT 30  07/08/2015 1149   BILITOT 0.6 04/09/2017 1439   BILITOT 0.90 05/08/2016 1305   BILITOT 0.64 07/08/2015 1149         Impression and Plan: [name] is 77 year old white female. She has iron deficiency. I suspect that this is secondary to malabsorption.   We will see what her iron levels are.  It is possible that her iron might be on the low side.  I will still plan to see her back in 6 months.  Volanda Napoleon, MD 4/8/20192:06 PM.

## 2017-05-15 LAB — IRON AND TIBC
Iron: 79 ug/dL (ref 41–142)
SATURATION RATIOS: 27 % (ref 21–57)
TIBC: 290 ug/dL (ref 236–444)
UIBC: 211 ug/dL

## 2017-05-15 LAB — FERRITIN: FERRITIN: 212 ng/mL (ref 9–269)

## 2017-05-16 ENCOUNTER — Encounter: Payer: Self-pay | Admitting: *Deleted

## 2017-05-23 ENCOUNTER — Ambulatory Visit (HOSPITAL_BASED_OUTPATIENT_CLINIC_OR_DEPARTMENT_OTHER)
Admission: RE | Admit: 2017-05-23 | Discharge: 2017-05-23 | Disposition: A | Discharge status: Home / Self Care | Payer: Medicare Other | Source: Ambulatory Visit | Attending: Medical | Admitting: Medical

## 2017-05-23 ENCOUNTER — Ambulatory Visit (INDEPENDENT_AMBULATORY_CARE_PROVIDER_SITE_OTHER): Payer: Medicare Other | Admitting: Medical

## 2017-05-23 ENCOUNTER — Encounter: Payer: Self-pay | Admitting: Medical

## 2017-05-23 ENCOUNTER — Telehealth: Payer: Self-pay | Admitting: Internal Medicine

## 2017-05-23 ENCOUNTER — Telehealth: Payer: Self-pay | Admitting: Medical

## 2017-05-23 VITALS — BP 140/54 | HR 57 | Temp 97.9°F | Resp 16 | Wt 172.4 lb

## 2017-05-23 DIAGNOSIS — R1032 Left lower quadrant pain: Secondary | ICD-10-CM | POA: Insufficient documentation

## 2017-05-23 DIAGNOSIS — R102 Pelvic and perineal pain: Secondary | ICD-10-CM | POA: Diagnosis not present

## 2017-05-23 DIAGNOSIS — K5900 Constipation, unspecified: Secondary | ICD-10-CM

## 2017-05-23 LAB — CBC WITH DIFFERENTIAL/PLATELET
BASOS PCT: 0.7 % (ref 0.0–3.0)
Basophils Absolute: 0 10*3/uL (ref 0.0–0.1)
Eosinophils Absolute: 0.1 10*3/uL (ref 0.0–0.7)
Eosinophils Relative: 0.9 % (ref 0.0–5.0)
HCT: 38.8 % (ref 36.0–46.0)
HEMOGLOBIN: 13.2 g/dL (ref 12.0–15.0)
Lymphocytes Relative: 30.1 % (ref 12.0–46.0)
Lymphs Abs: 1.8 10*3/uL (ref 0.7–4.0)
MCHC: 34 g/dL (ref 30.0–36.0)
MCV: 85.1 fl (ref 78.0–100.0)
MONO ABS: 0.5 10*3/uL (ref 0.1–1.0)
Monocytes Relative: 8.5 % (ref 3.0–12.0)
NEUTROS ABS: 3.6 10*3/uL (ref 1.4–7.7)
Neutrophils Relative %: 59.8 % (ref 43.0–77.0)
PLATELETS: 217 10*3/uL (ref 150.0–400.0)
RBC: 4.55 Mil/uL (ref 3.87–5.11)
RDW: 12.6 % (ref 11.5–15.5)
WBC: 6.1 10*3/uL (ref 4.0–10.5)

## 2017-05-23 LAB — COMPREHENSIVE METABOLIC PANEL
ALT: 33 U/L (ref 0–35)
AST: 24 U/L (ref 0–37)
Albumin: 4.3 g/dL (ref 3.5–5.2)
Alkaline Phosphatase: 58 U/L (ref 39–117)
BUN: 13 mg/dL (ref 6–23)
CHLORIDE: 97 meq/L (ref 96–112)
CO2: 25 meq/L (ref 19–32)
CREATININE: 0.71 mg/dL (ref 0.40–1.20)
Calcium: 9.9 mg/dL (ref 8.4–10.5)
GFR: 85.03 mL/min (ref >60.00)
GLUCOSE: 154 mg/dL — AB (ref 70–99)
Potassium: 4 mEq/L (ref 3.5–5.1)
SODIUM: 131 meq/L — AB (ref 135–145)
Total Bilirubin: 0.6 mg/dL (ref 0.2–1.2)
Total Protein: 7.2 g/dL (ref 6.0–8.3)

## 2017-05-23 LAB — POC URINALSYSI DIPSTICK (AUTOMATED)
BILIRUBIN UA: NEGATIVE
GLUCOSE UA: NEGATIVE
Ketones, UA: NEGATIVE
LEUKOCYTES UA: NEGATIVE
NITRITE UA: NEGATIVE
Protein, UA: NEGATIVE
RBC UA: NEGATIVE
Spec Grav, UA: 1.01 (ref 1.010–1.025)
Urobilinogen, UA: NEGATIVE E.U./dL — AB
pH, UA: 6 (ref 5.0–8.0)

## 2017-05-23 LAB — AMYLASE: Amylase: 58 U/L (ref 27–131)

## 2017-05-23 LAB — LIPASE: Lipase: 24 U/L (ref 11.0–59.0)

## 2017-05-23 MED ORDER — ONDANSETRON 8 MG PO TBDP: 8.0000 mg | ORAL_TABLET | Freq: Three times a day (TID) | ORAL | 0 refills | Status: DC | PRN

## 2017-05-23 MED FILL — ONDANSETRON ODT 8 MG TABLET: 8 | 6 days supply | Qty: 20 | Fill #0

## 2017-05-23 NOTE — Progress Notes (Signed)
Subjective:    [name], female    DOB: 12-29-1940, 77 y.o.   MRN: [id number]  HPI  Pt in for some lower abdomen region pain. Pt had some pain for about a week.  Pt states has known history of diverticulosis. Mild pain over past week. This morning when she woke up has nausea and she has some left lower abdomen pain.  Pt states last bowel movement yesterday. Pt does also have some ibs. She states she had hard stool yesterday and loose stool yesterday.  Pt had one event of diverticultis in the past. Only colonosocopy I see in epic is in 2002. Pt states last one done 10 years ago and was not told to repeat.  Pt has no fever and no vomiting.  Pt states she took bentyl yesterday but not today. She does not take this regularly.  She took nexium this morning and reglan. She does not take reglan every day.    Review of Systems  Constitutional: Negative for chills, fatigue and fever.  Respiratory: Negative for cough, chest tightness, wheezing and stridor.   Cardiovascular: Negative for chest pain and palpitations.  Gastrointestinal: Positive for abdominal pain and nausea. Negative for abdominal distention, blood in stool and rectal pain.       Describes both hard stool and diarrhea yesterday.  Musculoskeletal: Negative for back pain.  Skin: Negative for rash.  Neurological: Negative for dizziness, syncope, weakness and headaches.  Hematological: Negative for adenopathy. Does not bruise/bleed easily.  Psychiatric/Behavioral: Negative for behavioral problems and confusion.    Past Medical History:  Diagnosis Date  . Abdominal pain in female 03/18/2010   Qualifier: Diagnosis of  By: Carlean Purl MD, Dimas Millin Anemia 06/08/2014  . Anxiety   . Arthritis    Spinal Osteoarthritis  . Cancer ([place])   . Carcinoid tumor of stomach   . Cataract   . Chest pain    Myoview 12/15 no ischemia.  . Chronic kidney disease    Left kidney smaller than right kidney  .  Constipation 11/21/2016  . Diabetes mellitus type 2 in obese ([place]) 09/05/2006   Qualifier: Diagnosis of  By: Marca Ancona RMA, Lucy    . Diabetic peripheral neuropathy ([place]) 10/29/2013  . Diverticulosis 08/30/2000   Colonoscopy   . Encounter for preventative adult health care exam with abnormal findings 09/14/2013  . Esophageal reflux   . Gastric polyp    Fundic Gland  . Gastroparesis   . Headache(784.0)   . Heart murmur    Echocardiogram 2/11: EF 60-65%, mild LAE, grade 1 diastolic dysfunction, aortic valve sclerosis, mean gradient 9 mm of mercury, PASP 34  . Hematuria 03/16/2016  . Iron deficiency anemia, unspecified   . 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 Leg swelling    bilateral  . Neck pain 04/22/2015  . PONV (postoperative nausea and vomiting)    pt states only needs small amount of anesthesia  . PSVT (paroxysmal supraventricular tachycardia) ([place])   . Pure hypercholesterolemia   . 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 Stroke ([place])    tia, 2014  . 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 Type II or unspecified type diabetes mellitus without mention of complication, not stated as uncontrolled   . Unspecified essential hypertension   . Unspecified hereditary and idiopathic peripheral neuropathy 10/29/2013     Social History   Socioeconomic History  . Marital status: Married    Spouse name: Not on file  . Number of children: 0  .  Years of education: college  . Highest education level: Not on file  Occupational History  . Occupation: Lexicographer  . Financial resource strain: Not on file  . Food insecurity:    Worry: Not on file    Inability: Not on file  . Transportation needs:    Medical: Not on file    Non-medical: Not on file  Tobacco Use  . Smoking status: Never Smoker  . Smokeless tobacco: Never Used  . Tobacco comment: Never used tobacco  Substance and Sexual Activity  . Alcohol use: No    Alcohol/week: 0.0 oz  . Drug use: No  . Sexual activity: Never    Partners: Male    Birth  control/protection: Post-menopausal    Comment: lives alone, no dietary restrictions except avoid fresh veg, fruit, whole grains  Lifestyle  . Physical activity:    Days per week: Not on file    Minutes per session: Not on file  . Stress: Not on file  Relationships  . Social connections:    Talks on phone: Not on file    Gets together: Not on file    Attends religious service: Not on file    Active member of club or organization: Not on file    Attends meetings of clubs or organizations: Not on file    Relationship status: Not on file  . Intimate partner violence:    Fear of current or ex partner: Not on file    Emotionally abused: Not on file    Physically abused: Not on file    Forced sexual activity: Not on file  Other Topics Concern  . Not on file  Social History Narrative   Patient was married ([name]) - widow   Patient does not have any children.   Patient is right-handed.   Patient has a BA degree.   One caffeine drink daily     Past Surgical History:  Procedure Laterality Date  . CHOLECYSTECTOMY  1993  . COLONOSCOPY  11/11/2010   diverticulosis  . DILATATION & CURRETTAGE/HYSTEROSCOPY WITH RESECTOCOPE N/A 02/25/2013   Procedure: Attempted hysteroscopy with uterine perforation;  Surgeon: Jamey Reas de Berton Lan, MD;  Location: [facility];  Service: Gynecology;  Laterality: N/A;  . ESOPHAGOGASTRODUODENOSCOPY  08/29/2010; 09/15/2010   Carcinoid tumor less than 1 cm in July 2012 not seen in August 2012 , gastritis, fundic gland polyps  . ESOPHAGOGASTRODUODENOSCOPY  05/16/2011  . ESOPHAGOGASTRODUODENOSCOPY  06/14/2012  . EUS  12/15/2010   Procedure: UPPER ENDOSCOPIC ULTRASOUND (EUS) LINEAR;  Surgeon: Owens Loffler, MD;  Location: [facility];  Service: Endoscopy;  Laterality: N/A;  . EYE SURGERY Bilateral    Bi lateral cateracts and bi lateral laser  . LAPAROSCOPY N/A 02/25/2013   Procedure: Cystoscopy and laparoscopy with fulguration of uterine serosa;  Surgeon: Jamey Reas de Berton Lan, MD;  Location: [facility];  Service: Gynecology;  Laterality: N/A;  . TONSILLECTOMY      Family History  Problem Relation Age of Onset  . Diabetes Mother   . Stroke Father        deceased age 66  . Heart disease Sister        deceased MI age 82  . Heart disease Brother        deceased MI age 56  . Diabetes Maternal Grandmother   . Hypertension Paternal Grandmother   . Diabetes Sister   . Heart disease Sister   . Hypertension Sister   .  Hyperlipidemia Sister   . Diabetes Sister   . Heart disease Sister   . Hypertension Sister   . Hyperlipidemia Sister   . Diabetes Brother   . Heart disease Brother   . Hypertension Brother   . Hyperlipidemia Brother   . Colon cancer Neg Hx   . Esophageal cancer Neg Hx   . Stomach cancer Neg Hx   . Rectal cancer Neg Hx     Allergies  Allergen Reactions  . Tramadol Other (See Comments)    Dizziness     Current Outpatient Medications on File Prior to Visit  Medication Sig Dispense Refill  . ALPRAZolam (XANAX) 0.25 MG tablet Take 1 tablet (0.25 mg total) by mouth 2 (two) times daily as needed for anxiety. 30 tablet 1  . amLODipine (NORVASC) 5 MG tablet TAKE 1 TABLET BY MOUTH  DAILY 90 tablet 3  . aspirin 81 MG tablet Take 81 mg by mouth daily.    . cholecalciferol (VITAMIN D) 1000 UNITS tablet Take 1,000 Units by mouth daily.      Marland Kitchen dicyclomine (BENTYL) 10 MG capsule Take 1 capsule (10 mg total) by mouth 4 (four) times daily as needed. 120 capsule 6  . esomeprazole (NEXIUM) 40 MG capsule Take 1 capsule (40 mg total) by mouth daily before breakfast. 30 capsule 11  . FOLBIC 2.5-25-2 MG TABS tablet TAKE 1 TABLET BY MOUTH EVERY DAY 90 tablet 1  . glucose blood (ONE TOUCH ULTRA TEST) test strip USE 2 TIMES DAILY FOR DIABETIC TESTING 100 each 2  . losartan (COZAAR) 50 MG tablet Take 1 tablet (50 mg total) by mouth 2 (two) times daily. 180 tablet 0  . metFORMIN (GLUCOPHAGE-XR) 500 MG 24 hr tablet TAKE 1 TABLET BY MOUTH  3  TIMES DAILY WITH MEALS 270 tablet 0  . metoCLOPramide (REGLAN) 5 MG tablet TAKE 1 TABLET BY MOUTH TWO  TIMES DAILY 180 tablet 1  . metoprolol tartrate (LOPRESSOR) 50 MG tablet Take 1.5 tablets (75 mg total) by mouth 2 (two) times daily. 225 tablet 2  . Multiple Vitamin (MULTIVITAMIN) tablet Take 1 tablet by mouth daily.      . Multiple Vitamins-Calcium (VIACTIV MULTI-VITAMIN) CHEW Chew 2 each by mouth daily.    . ondansetron (ZOFRAN) 4 MG tablet Take 4 mg by mouth every 4 (four) hours as needed. For nausea      . PHARMACIST CHOICE LANCETS MISC USE 2 TIMES DAILY FOR DIABETIC TESTING 100 each 1  . potassium chloride (K-DUR,KLOR-CON) 10 MEQ tablet TAKE 1 TABLET BY MOUTH  DAILY 90 tablet 2  . rosuvastatin (CRESTOR) 10 MG tablet TAKE 1 TABLET BY MOUTH  DAILY 90 tablet 2   No current facility-administered medications on file prior to visit.     BP (!) 140/54   Pulse (!) 57   Temp 97.9 F (36.6 C) (Oral)   Resp 16   Wt 172 lb 6.4 oz (78.2 kg)   LMP 02/07/1992   SpO2 99%   BMI 33.67 kg/m       Objective:   Physical Exam   General Appearance- Not in acute distress.  HEENT Eyes- Scleraeral/Conjuntiva-bilat- Not Yellow. Mouth & Throat- Normal.  Chest and Lung Exam Auscultation: Breath sounds:-Normal. Adventitious sounds:- No Adventitious sounds.  Cardiovascular Auscultation:Rythm - Regular. Heart Sounds -Normal heart sounds.  Abdomen Inspection:-Inspection Normal.  Palpation/Perucssion: Palpation and Percussion of the abdomen reveal- mild mid suprapubic tenderness to palpation and mild left lower quadrant  Tenderness, No Rebound tenderness, No rigidity(Guarding) and No  Palpable abdominal masses.  Liver:-Normal.  Spleen:- Normal.   Back- no cva tenderness    Assessment & Plan:  For your recent moderate abdomen pain, I want to get CBC, CMP, amylase, lipase and x-ray of abdomen.  You have a history of IBS, occasional diverticulitis and abnormal bowel pattern  yesterday.(Description of hard stool with some loose stools).  I want to evaluate blood work to see if you have elevated infection fighting cells, elevated pancreas enzymes or electrolyte abnormality.  Will follow x-ray of abdomen to assess amount of stool present.  Your urine was clear.  I recommend that you continue with your medications prescribed by GI.  Continue Benadryl, Reglan and Nexium.  After labs and imaging tests reviewed, my decide to add on antibiotics for diverticulitis.  If significant amount of stool present in colon might recommend other medication as well.  If severe pain after hours despite above measures then recommend ED evaluation.  Also over the long holiday weekend recommend ED evaluation if symptoms worsen.  Will see how you do by tomorrow.  Might consider doing CT abdomen pelvis before the weekend as well.  Follow-up in 7 days or as needed  [employer], [employer]

## 2017-05-23 NOTE — Telephone Encounter (Signed)
See xray result note on abdomen xray done yesterday. Will you call and notify pt.

## 2017-05-23 NOTE — Patient Instructions (Addendum)
For your recent moderate abdomen pain, I want to get CBC, CMP, amylase, lipase and x-ray of abdomen.  You have a history of IBS, occasional diverticulitis and abnormal bowel pattern yesterday.(Description of hard stool with some loose stools).  I want to evaluate blood work to see if you have elevated infection fighting cells, elevated pancreas enzymes or electrolyte abnormality.  Will follow x-ray of abdomen to assess amount of stool present.  Your urine was clear.  I recommend that you continue with your medications prescribed by GI.  Continue Benadryl, Reglan and Nexium. rx zofran fiven for nausea.  After labs and imaging tests reviewed, my decide to add on antibiotics for diverticulitis.  If significant amount of stool present in colon might recommend other medication as well.  If severe pain after hours despite above measures then recommend ED evaluation.  Also over the long holiday weekend recommend ED evaluation if symptoms worsen.  Will see how you do by tomorrow.  Might consider doing CT abdomen pelvis before the weekend as well.  Follow-up in 7 days or as needed

## 2017-05-23 NOTE — Telephone Encounter (Signed)
Pt experiencing abdominal pain, thinks that it is her diverticulitis acting up. She wants to know what she can do.

## 2017-05-23 NOTE — Telephone Encounter (Signed)
Left message for patient to call back  

## 2017-05-23 NOTE — Addendum Note (Signed)
Addended by: Hinton Dyer on: 05/23/2017 01:05 PM   Modules accepted: Orders

## 2017-05-24 NOTE — Telephone Encounter (Signed)
I don't see my chart message in my box??

## 2017-05-24 NOTE — Telephone Encounter (Signed)
Pt reviewed results on mychart. 

## 2017-05-28 ENCOUNTER — Telehealth: Payer: Self-pay | Admitting: Family Medicine

## 2017-05-28 NOTE — Telephone Encounter (Signed)
Copied from [place] [id number]. Topic: Quick Communication - Rx Refill/Question >> May 28, 2017  1:46 PM Margot Ables wrote: Medication: [facility] has RX for lancets and test strips that differ. Please call or resend both with the same frequency of testing. Is it once per day or twice per day? Preferred Pharmacy (with phone number or street name): [facility]

## 2017-05-29 NOTE — Telephone Encounter (Signed)
No return call.  I will await a return call from her

## 2017-05-29 NOTE — Telephone Encounter (Signed)
Call placed to [place].  Spoke to [employer]; was advised they received clarification previously that the Test Strips are to be used bid.

## 2017-05-29 NOTE — Telephone Encounter (Signed)
[facility] called to get confirmation on how many times are the test stips are used, 1x / 2x daily, call [phone] to advise

## 2017-05-30 ENCOUNTER — Other Ambulatory Visit: Payer: Self-pay | Admitting: Cardiology

## 2017-05-30 ENCOUNTER — Other Ambulatory Visit: Payer: Self-pay | Admitting: Family Medicine

## 2017-06-14 NOTE — Progress Notes (Signed)
HPI The patient presents for followup of SVT.     Since I last saw her she called because she wanted to change from  Avapro to Cozaar because of ARB recalls.  She is very concerned because she had an episode of forgetting where she was 1 day when she was driving.  She did not have palpitations, presyncope or syncope.  She has not had any chest pressure.  She does get short of breath with activities.  She is not doing any exercising except for some occasional stretching.  She has some mild dizziness which she describes.  She has had some mild lower extremity swelling.  She is very anxious about her cardiac condition because her brother was diagnosed with heart disease    Allergies  Allergen Reactions  . Tramadol Other (See Comments)    Dizziness     Current Outpatient Medications  Medication Sig Dispense Refill  . ALPRAZolam (XANAX) 0.25 MG tablet Take 1 tablet (0.25 mg total) by mouth 2 (two) times daily as needed for anxiety. 30 tablet 1  . amLODipine (NORVASC) 5 MG tablet TAKE 1 TABLET BY MOUTH  DAILY 90 tablet 3  . aspirin 81 MG tablet Take 81 mg by mouth daily.    . cholecalciferol (VITAMIN D) 1000 UNITS tablet Take 1,000 Units by mouth daily.      Marland Kitchen dicyclomine (BENTYL) 10 MG capsule Take 1 capsule (10 mg total) by mouth 4 (four) times daily as needed. 120 capsule 6  . esomeprazole (NEXIUM) 40 MG capsule Take 1 capsule (40 mg total) by mouth daily before breakfast. 30 capsule 11  . FOLBIC 2.5-25-2 MG TABS tablet TAKE 1 TABLET BY MOUTH EVERY DAY 90 tablet 1  . glucose blood (ONE TOUCH ULTRA TEST) test strip USE 2 TIMES DAILY FOR DIABETIC TESTING 100 each 2  . losartan (COZAAR) 50 MG tablet Take 1 tablet (50 mg total) by mouth 2 (two) times daily. 180 tablet 0  . metFORMIN (GLUCOPHAGE-XR) 500 MG 24 hr tablet TAKE 1 TABLET BY MOUTH 3  TIMES DAILY WITH MEALS 270 tablet 0  . metoCLOPramide (REGLAN) 5 MG tablet TAKE 1 TABLET BY MOUTH TWO  TIMES DAILY 180 tablet 1  . metoprolol tartrate  (LOPRESSOR) 50 MG tablet TAKE 1 AND 1/2 TABLETS BY  MOUTH TWICE DAILY 270 tablet 0  . Multiple Vitamin (MULTIVITAMIN) tablet Take 1 tablet by mouth daily.      . Multiple Vitamins-Calcium (VIACTIV MULTI-VITAMIN) CHEW Chew 2 each by mouth daily.    . ondansetron (ZOFRAN ODT) 8 MG disintegrating tablet Take 1 tablet (8 mg total) by mouth every 8 (eight) hours as needed for nausea or vomiting. 20 tablet 0  . ondansetron (ZOFRAN) 4 MG tablet Take 4 mg by mouth every 4 (four) hours as needed. For nausea      . PHARMACIST CHOICE LANCETS MISC USE 2 TIMES DAILY FOR DIABETIC TESTING 100 each 1  . potassium chloride (K-DUR,KLOR-CON) 10 MEQ tablet TAKE 1 TABLET BY MOUTH  DAILY 90 tablet 2  . rosuvastatin (CRESTOR) 10 MG tablet TAKE 1 TABLET BY MOUTH  DAILY 90 tablet 2   No current facility-administered medications for this visit.     Past Medical History:  Diagnosis Date  . Abdominal pain in female 03/18/2010   Qualifier: Diagnosis of  By: Carlean Purl MD, Dimas Millin Anemia 06/08/2014  . Anxiety   . Arthritis    Spinal Osteoarthritis  . Cancer ([place])   .  Carcinoid tumor of stomach   . Cataract   . Chest pain    Myoview 12/15 no ischemia.  . Chronic kidney disease    Left kidney smaller than right kidney  . Constipation 11/21/2016  . Diabetes mellitus type 2 in obese ([place]) 09/05/2006   Qualifier: Diagnosis of  By: Marca Ancona RMA, Lucy    . Diabetic peripheral neuropathy ([place]) 10/29/2013  . Diverticulosis 08/30/2000   Colonoscopy   . Encounter for preventative adult health care exam with abnormal findings 09/14/2013  . Esophageal reflux   . Gastric polyp    Fundic Gland  . Gastroparesis   . Headache(784.0)   . Heart murmur    Echocardiogram 2/11: EF 60-65%, mild LAE, grade 1 diastolic dysfunction, aortic valve sclerosis, mean gradient 9 mm of mercury, PASP 34  . Hematuria 03/16/2016  . Iron deficiency anemia, unspecified   . 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 Leg swelling    bilateral  . Neck pain  04/22/2015  . PONV (postoperative nausea and vomiting)    pt states only needs small amount of anesthesia  . PSVT (paroxysmal supraventricular tachycardia) ([place])   . Pure hypercholesterolemia   . 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 Stroke ([place])    tia, 2014  . 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 Type II or unspecified type diabetes mellitus without mention of complication, not stated as uncontrolled   . Unspecified essential hypertension   . Unspecified hereditary and idiopathic peripheral neuropathy 10/29/2013    Past Surgical History:  Procedure Laterality Date  . CHOLECYSTECTOMY  1993  . COLONOSCOPY  11/11/2010   diverticulosis  . DILATATION & CURRETTAGE/HYSTEROSCOPY WITH RESECTOCOPE N/A 02/25/2013   Procedure: Attempted hysteroscopy with uterine perforation;  Surgeon: Jamey Reas de Berton Lan, MD;  Location: [facility];  Service: Gynecology;  Laterality: N/A;  . ESOPHAGOGASTRODUODENOSCOPY  08/29/2010; 09/15/2010   Carcinoid tumor less than 1 cm in July 2012 not seen in August 2012 , gastritis, fundic gland polyps  . ESOPHAGOGASTRODUODENOSCOPY  05/16/2011  . ESOPHAGOGASTRODUODENOSCOPY  06/14/2012  . EUS  12/15/2010   Procedure: UPPER ENDOSCOPIC ULTRASOUND (EUS) LINEAR;  Surgeon: Owens Loffler, MD;  Location: [facility];  Service: Endoscopy;  Laterality: N/A;  . EYE SURGERY Bilateral    Bi lateral cateracts and bi lateral laser  . LAPAROSCOPY N/A 02/25/2013   Procedure: Cystoscopy and laparoscopy with fulguration of uterine serosa;  Surgeon: Jamey Reas de Berton Lan, MD;  Location: [facility];  Service: Gynecology;  Laterality: N/A;  . TONSILLECTOMY      ROS:   As stated in the HPI and negative for all other systems.   PHYSICAL EXAM BP (!) 169/79   Pulse 64   Wt 173 lb 12.8 oz (78.8 kg)   LMP 02/07/1992   SpO2 98%   BMI 33.94 kg/m   GENERAL:  Well appearing NECK:  No jugular venous distention, waveform within normal limits, carotid upstroke brisk and symmetric, no bruits, no  thyromegaly LUNGS:  Clear to auscultation bilaterally CHEST:  Unremarkable HEART:  PMI not displaced or sustained,S1 and S2 within normal limits, no S3, no S4, no clicks, no rubs, no murmurs ABD:  Flat, positive bowel sounds normal in frequency in pitch, no bruits, no rebound, no guarding, no midline pulsatile mass, no hepatomegaly, no splenomegaly EXT:  2 plus pulses throughout, no edema, no cyanosis no clubbing   EKG:  Sinus rhythm, rate 64, axis within normal limits, intervals within normal limits, no acute ST-T wave changes.   Lab  Results  Component Value Date   CHOL 186 04/06/2017   TRIG 112.0 04/06/2017   HDL 63.70 04/06/2017   LDLCALC 100 (H) 04/06/2017   LDLDIRECT 123.7 04/13/2011     Lab Results  Component Value Date   HGBA1C 6.4 04/06/2017     ASSESSMENT AND PLAN  Supraventricular Tachycardia - She is had no further symptomatic supraventricular tachycardia.  No further testing is indicated.  Hypertension - At the last visit I increased her beta blocker. Her blood pressure runs OK at home by her report.  I will not change her meds at this time.    Dyslipidemia -  Her LDL was OK was as above.  No change in therapy.  Carotid stenosis - She had mild plaque in 2016.  We can repeat this study.    Murmur - This is mild aortic stenosis. I will follow this clinically with a repeat echo.   Aortic atherosclerosis - She will continue with risk reduction.

## 2017-06-15 ENCOUNTER — Encounter: Payer: Self-pay | Admitting: Cardiology

## 2017-06-15 ENCOUNTER — Ambulatory Visit: Payer: Medicare Other | Admitting: Cardiology

## 2017-06-15 VITALS — BP 169/79 | HR 64 | Wt 173.8 lb

## 2017-06-15 DIAGNOSIS — I10 Essential (primary) hypertension: Secondary | ICD-10-CM | POA: Diagnosis not present

## 2017-06-15 DIAGNOSIS — I350 Nonrheumatic aortic (valve) stenosis: Secondary | ICD-10-CM | POA: Diagnosis not present

## 2017-06-15 DIAGNOSIS — I6523 Occlusion and stenosis of bilateral carotid arteries: Secondary | ICD-10-CM | POA: Diagnosis not present

## 2017-06-15 NOTE — Patient Instructions (Signed)
Medication Instructions:  Continue current medications  If you need a refill on your cardiac medications before your next appointment, please call your pharmacy.  Labwork: None Ordered  Testing/Procedures: Your physician has requested that you have an echocardiogram. Echocardiography is a painless test that uses sound waves to create images of your heart. It provides your doctor with information about the size and shape of your heart and how well your heart's chambers and valves are working. This procedure takes approximately one hour. There are no restrictions for this procedure.  Your physician has requested that you have a carotid duplex. This test is an ultrasound of the carotid arteries in your neck. It looks at blood flow through these arteries that supply the brain with blood. Allow one hour for this exam. There are no restrictions or special instructions.    Follow-Up: Your physician wants you to follow-up in: 6 Months. You should receive a reminder letter in the mail two months in advance. If you do not receive a letter, please call our office [phone].      Thank you for choosing [facility]!!       

## 2017-07-05 ENCOUNTER — Other Ambulatory Visit: Payer: Self-pay | Admitting: Emergency Medicine

## 2017-07-05 DIAGNOSIS — I10 Essential (primary) hypertension: Secondary | ICD-10-CM

## 2017-07-05 DIAGNOSIS — E1169 Type 2 diabetes mellitus with other specified complication: Secondary | ICD-10-CM

## 2017-07-05 DIAGNOSIS — E559 Vitamin D deficiency, unspecified: Secondary | ICD-10-CM

## 2017-07-05 DIAGNOSIS — E7800 Pure hypercholesterolemia, unspecified: Secondary | ICD-10-CM

## 2017-07-05 DIAGNOSIS — E669 Obesity, unspecified: Principal | ICD-10-CM

## 2017-07-06 ENCOUNTER — Other Ambulatory Visit (INDEPENDENT_AMBULATORY_CARE_PROVIDER_SITE_OTHER): Payer: Medicare Other

## 2017-07-06 DIAGNOSIS — I10 Essential (primary) hypertension: Secondary | ICD-10-CM

## 2017-07-06 DIAGNOSIS — E1169 Type 2 diabetes mellitus with other specified complication: Secondary | ICD-10-CM

## 2017-07-06 DIAGNOSIS — E669 Obesity, unspecified: Secondary | ICD-10-CM | POA: Diagnosis not present

## 2017-07-06 DIAGNOSIS — E559 Vitamin D deficiency, unspecified: Secondary | ICD-10-CM

## 2017-07-06 DIAGNOSIS — E7800 Pure hypercholesterolemia, unspecified: Secondary | ICD-10-CM | POA: Diagnosis not present

## 2017-07-06 LAB — CBC
HEMATOCRIT: 38.7 % (ref 36.0–46.0)
Hemoglobin: 13.1 g/dL (ref 12.0–15.0)
MCHC: 33.9 g/dL (ref 30.0–36.0)
MCV: 85.2 fl (ref 78.0–100.0)
Platelets: 211 10*3/uL (ref 150.0–400.0)
RBC: 4.54 Mil/uL (ref 3.87–5.11)
RDW: 12.7 % (ref 11.5–15.5)
WBC: 6.9 10*3/uL (ref 4.0–10.5)

## 2017-07-06 LAB — COMPREHENSIVE METABOLIC PANEL
ALK PHOS: 56 U/L (ref 39–117)
ALT: 32 U/L (ref 0–35)
AST: 24 U/L (ref 0–37)
Albumin: 4.4 g/dL (ref 3.5–5.2)
BILIRUBIN TOTAL: 0.7 mg/dL (ref 0.2–1.2)
BUN: 15 mg/dL (ref 6–23)
CO2: 28 mEq/L (ref 19–32)
Calcium: 9.7 mg/dL (ref 8.4–10.5)
Chloride: 95 mEq/L — ABNORMAL LOW (ref 96–112)
Creatinine, Ser: 0.71 mg/dL (ref 0.40–1.20)
GFR: 85.01 mL/min (ref >60.00)
GLUCOSE: 128 mg/dL — AB (ref 70–99)
POTASSIUM: 4.2 meq/L (ref 3.5–5.1)
SODIUM: 132 meq/L — AB (ref 135–145)
TOTAL PROTEIN: 7.1 g/dL (ref 6.0–8.3)

## 2017-07-06 LAB — LIPID PANEL
CHOLESTEROL: 189 mg/dL (ref 0–200)
HDL: 63.4 mg/dL (ref >39.00)
LDL Cholesterol: 102 mg/dL — ABNORMAL HIGH (ref 0–99)
NonHDL: 125.92
Total CHOL/HDL Ratio: 3
Triglycerides: 119 mg/dL (ref 0.0–149.0)
VLDL: 23.8 mg/dL (ref 0.0–40.0)

## 2017-07-06 LAB — TSH: TSH: 1.72 u[IU]/mL (ref 0.35–4.50)

## 2017-07-06 LAB — VITAMIN D 25 HYDROXY (VIT D DEFICIENCY, FRACTURES): VITD: 46.9 ng/mL (ref 30.00–100.00)

## 2017-07-06 LAB — HEMOGLOBIN A1C: HEMOGLOBIN A1C: 6.3 % (ref 4.6–6.5)

## 2017-07-10 ENCOUNTER — Ambulatory Visit (HOSPITAL_BASED_OUTPATIENT_CLINIC_OR_DEPARTMENT_OTHER)
Admission: RE | Admit: 2017-07-10 | Discharge: 2017-07-10 | Disposition: A | Discharge status: Home / Self Care | Payer: Medicare Other | Source: Ambulatory Visit | Attending: Cardiology | Admitting: Cardiology

## 2017-07-10 DIAGNOSIS — G459 Transient cerebral ischemic attack, unspecified: Secondary | ICD-10-CM | POA: Insufficient documentation

## 2017-07-10 DIAGNOSIS — E119 Type 2 diabetes mellitus without complications: Secondary | ICD-10-CM | POA: Diagnosis not present

## 2017-07-10 DIAGNOSIS — R000 Tachycardia, unspecified: Secondary | ICD-10-CM | POA: Insufficient documentation

## 2017-07-10 DIAGNOSIS — I119 Hypertensive heart disease without heart failure: Secondary | ICD-10-CM | POA: Diagnosis not present

## 2017-07-10 DIAGNOSIS — E785 Hyperlipidemia, unspecified: Secondary | ICD-10-CM | POA: Insufficient documentation

## 2017-07-10 DIAGNOSIS — I10 Essential (primary) hypertension: Secondary | ICD-10-CM | POA: Diagnosis not present

## 2017-07-10 DIAGNOSIS — I350 Nonrheumatic aortic (valve) stenosis: Secondary | ICD-10-CM

## 2017-07-10 NOTE — Progress Notes (Signed)
  Echocardiogram 2D Echocardiogram has been performed.  [name] 07/10/2017, 1:37 PM

## 2017-07-12 ENCOUNTER — Encounter: Payer: Self-pay | Admitting: Family Medicine

## 2017-07-12 ENCOUNTER — Telehealth: Payer: Self-pay | Admitting: Cardiology

## 2017-07-12 ENCOUNTER — Ambulatory Visit (INDEPENDENT_AMBULATORY_CARE_PROVIDER_SITE_OTHER): Payer: Medicare Other | Admitting: Family Medicine

## 2017-07-12 DIAGNOSIS — R51 Headache: Secondary | ICD-10-CM

## 2017-07-12 DIAGNOSIS — M26609 Unspecified temporomandibular joint disorder, unspecified side: Secondary | ICD-10-CM

## 2017-07-12 DIAGNOSIS — R519 Headache, unspecified: Secondary | ICD-10-CM

## 2017-07-12 DIAGNOSIS — E559 Vitamin D deficiency, unspecified: Secondary | ICD-10-CM | POA: Diagnosis not present

## 2017-07-12 DIAGNOSIS — E7800 Pure hypercholesterolemia, unspecified: Secondary | ICD-10-CM

## 2017-07-12 DIAGNOSIS — E1142 Type 2 diabetes mellitus with diabetic polyneuropathy: Secondary | ICD-10-CM

## 2017-07-12 DIAGNOSIS — E669 Obesity, unspecified: Secondary | ICD-10-CM | POA: Diagnosis not present

## 2017-07-12 DIAGNOSIS — K582 Mixed irritable bowel syndrome: Secondary | ICD-10-CM

## 2017-07-12 DIAGNOSIS — E1169 Type 2 diabetes mellitus with other specified complication: Secondary | ICD-10-CM

## 2017-07-12 DIAGNOSIS — I10 Essential (primary) hypertension: Secondary | ICD-10-CM

## 2017-07-12 MED ORDER — GLUCOSE BLOOD VI STRP: ORAL_STRIP | 2 refills | Status: DC

## 2017-07-12 MED ORDER — LOSARTAN POTASSIUM 50 MG PO TABS: 50.0000 mg | ORAL_TABLET | Freq: Two times a day (BID) | ORAL | 0 refills | Status: DC

## 2017-07-12 MED FILL — LOSARTAN POTASSIUM 50 MG TA: 50 | 90 days supply | Qty: 180 | Fill #0

## 2017-07-12 NOTE — Assessment & Plan Note (Signed)
Well controlled, no changes to meds. Encouraged heart healthy diet such as the DASH diet and exercise as tolerated.  °

## 2017-07-12 NOTE — Assessment & Plan Note (Signed)
With some dizziness at times. Left sided headache off and on over several months. Has a hsitory of DDD in nect and TMJ and ear pressure. No tinnitis. No change in vision, no change in hearing. Check labs.

## 2017-07-12 NOTE — Assessment & Plan Note (Signed)
hgba1c acceptable, minimize simple carbs. Increase exercise as tolerated. Continue current meds. symptomas adequately managed.

## 2017-07-12 NOTE — Assessment & Plan Note (Signed)
Given and reviewed copy of ACP documents from [place] Secretary of State and encouraged to complete and return 

## 2017-07-12 NOTE — Assessment & Plan Note (Signed)
Encouraged heart healthy diet, increase exercise, avoid trans fats, consider a krill oil cap daily 

## 2017-07-12 NOTE — Telephone Encounter (Signed)
Notes recorded by Minus Breeding, MD on 07/11/2017 at 4:31 PM EDT No significant abnormalities. No change in therapy. Call Ms. Wrubel with the results and send results to [name], MD   Patient aware and verbalized understanding. Results sent to PCP

## 2017-07-12 NOTE — Patient Instructions (Signed)
Try Lidocaine gel by Aspercreme, Icy Hot or Salon Pas  Temporomandibular Joint Syndrome Temporomandibular joint (TMJ) syndrome is a condition that affects the joints between your jaw and your skull. The TMJs are located near your ears and allow your jaw to open and close. These joints and the nearby muscles are involved in all movements of the jaw. People with TMJ syndrome have pain in the area of these joints and muscles. Chewing, biting, or other movements of the jaw can be difficult or painful. TMJ syndrome can be caused by various things. In many cases, the condition is mild and goes away within a few weeks. For some people, the condition can become a long-term problem. What are the causes? Possible causes of TMJ syndrome include:  Grinding your teeth or clenching your jaw. Some people do this when they are under stress.  Arthritis.  Injury to the jaw.  Head or neck injury.  Teeth or dentures that are not aligned well.  In some cases, the cause of TMJ syndrome may not be known. What are the signs or symptoms? The most common symptom is an aching pain on the side of the head in the area of the TMJ. Other symptoms may include:  Pain when moving your jaw, such as when chewing or biting.  Being unable to open your jaw all the way.  Making a clicking sound when you open your mouth.  Headache.  Earache.  Neck or shoulder pain.  How is this diagnosed? Diagnosis can usually be made based on your symptoms, your medical history, and a physical exam. Your health care provider may check the range of motion of your jaw. Imaging tests, such as X-rays or an MRI, are sometimes done. You may need to see your dentist to determine if your teeth and jaw are lined up correctly. How is this treated? TMJ syndrome often goes away on its own. If treatment is needed, the options may include:  Eating soft foods and applying ice or heat.  Medicines to relieve pain or inflammation.  Medicines to  relax the muscles.  A splint, bite plate, or mouthpiece to prevent teeth grinding or jaw clenching.  Relaxation techniques or counseling to help reduce stress.  Transcutaneous electrical nerve stimulation (TENS). This helps to relieve pain by applying an electrical current through the skin.  Acupuncture. This is sometimes helpful to relieve pain.  Jaw surgery. This is rarely needed.  Follow these instructions at home:  Take medicines only as directed by your health care provider.  Eat a soft diet if you are having trouble chewing.  Apply ice to the painful area. ? Put ice in a plastic bag. ? Place a towel between your skin and the bag. ? Leave the ice on for 20 minutes, 2-3 times a day.  Apply a warm compress to the painful area as directed.  Massage your jaw area and perform any jaw stretching exercises as recommended by your health care provider.  If you were given a mouthpiece or bite plate, wear it as directed.  Avoid foods that require a lot of chewing. Do not chew gum.  Keep all follow-up visits as directed by your health care provider. This is important. Contact a health care provider if:  You are having trouble eating.  You have new or worsening symptoms. Get help right away if:  Your jaw locks open or closed. This information is not intended to replace advice given to you by your health care provider. Make sure you  discuss any questions you have with your health care provider. Document Released: 10/18/2000 Document Revised: 09/23/2015 Document Reviewed: 08/28/2013 Elsevier Interactive Patient Education  [employer].

## 2017-07-12 NOTE — Telephone Encounter (Signed)
New Message   Patient is calling in reference to her echocardiogram test results. Please call.

## 2017-07-12 NOTE — Progress Notes (Signed)
Subjective:  I acted as a Education administrator for Dr. Charlett [name]. [name], [place]  [name], female    DOB: 1940/05/30, 77 y.o.   MRN: [id number]  Chief Complaint  Patient presents with  . Follow-up    HPI  Patient is in today for a 3 month follow up and she is noting pain at her left TMJ joint. She has had a guard made by her dentist in the past but she feels she needs referral for a new one. No recent febrile illness or recent hospitalizations. No polyuria or polydipsia. Denies CP/palp/SOB/HA/congestion/fevers/GI or GU c/o. Taking meds as prescribed  Patient Care Team: Valerie Lukes, MD as PCP - General (Family Medicine) Valerie Mayer, MD as Consulting Physician (Gastroenterology) [name], Valerie Locket, MD (Neurology) Valerie Breeding, MD as Attending Physician (Cardiology)   Past Medical History:  Diagnosis Date  . Abdominal pain in female 03/18/2010   Qualifier: Diagnosis of  By: Valerie Purl MD, [name] Anemia 06/08/2014  . Anxiety   . Arthritis    Spinal Osteoarthritis  . Cancer ([place])   . Carcinoid tumor of stomach   . Cataract   . Chest pain    Myoview 12/15 no ischemia.  . Chronic kidney disease    Left kidney smaller than right kidney  . Constipation 11/21/2016  . Diabetes mellitus type 2 in obese ([place]) 09/05/2006   Qualifier: Diagnosis of  By: [name] RMA, [name]    . Diabetic peripheral neuropathy ([place]) 10/29/2013  . Diverticulosis 08/30/2000   Colonoscopy   . Encounter for preventative adult health care exam with abnormal findings 09/14/2013  . Esophageal reflux   . Gastric polyp    Fundic Gland  . Gastroparesis   . Headache(784.0)   . Heart murmur    Echocardiogram 2/11: EF 60-65%, mild LAE, grade 1 diastolic dysfunction, aortic valve sclerosis, mean gradient 9 mm of mercury, PASP 34  . Hematuria 03/16/2016  . Iron deficiency anemia, unspecified   . 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 Leg swelling    bilateral  . Neck pain 04/22/2015  . PONV (postoperative nausea  and vomiting)    pt states only needs small amount of anesthesia  . PSVT (paroxysmal supraventricular tachycardia) ([place])   . Pure hypercholesterolemia   . 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 Stroke ([place])    tia, 2014  . 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 Type II or unspecified type diabetes mellitus without mention of complication, not stated as uncontrolled   . Unspecified essential hypertension   . Unspecified hereditary and idiopathic peripheral neuropathy 10/29/2013    Past Surgical History:  Procedure Laterality Date  . CHOLECYSTECTOMY  1993  . COLONOSCOPY  11/11/2010   diverticulosis  . DILATATION & CURRETTAGE/HYSTEROSCOPY WITH RESECTOCOPE N/A 02/25/2013   Procedure: Attempted hysteroscopy with uterine perforation;  Surgeon: Valerie Reas de Berton Lan, MD;  Location: [facility];  Service: Gynecology;  Laterality: N/A;  . ESOPHAGOGASTRODUODENOSCOPY  08/29/2010; 09/15/2010   Carcinoid tumor less than 1 cm in July 2012 not seen in August 2012 , gastritis, fundic gland polyps  . ESOPHAGOGASTRODUODENOSCOPY  05/16/2011  . ESOPHAGOGASTRODUODENOSCOPY  06/14/2012  . EUS  12/15/2010   Procedure: UPPER ENDOSCOPIC ULTRASOUND (EUS) LINEAR;  Surgeon: Valerie Loffler, MD;  Location: [facility];  Service: Endoscopy;  Laterality: N/A;  . EYE SURGERY Bilateral    Bi lateral cateracts and bi lateral laser  . LAPAROSCOPY N/A 02/25/2013   Procedure: Cystoscopy and laparoscopy with fulguration of uterine serosa;  Surgeon: Valerie Reas de Berton Lan, MD;  Location: [facility];  Service: Gynecology;  Laterality: N/A;  . TONSILLECTOMY      Family History  Problem Relation Age of Onset  . Diabetes Mother   . Stroke Father        deceased age 73  . Heart disease Sister        deceased MI age 76  . Heart disease Brother        deceased MI age 71  . Diabetes Maternal Grandmother   . Hypertension Paternal Grandmother   . Diabetes Sister   . Heart disease Sister   . Hypertension Sister   . Hyperlipidemia Sister     . Diabetes Sister   . Heart disease Sister   . Hypertension Sister   . Hyperlipidemia Sister   . Diabetes Brother   . Heart disease Brother   . Hypertension Brother   . Hyperlipidemia Brother   . Colon cancer Neg Hx   . Esophageal cancer Neg Hx   . Stomach cancer Neg Hx   . Rectal cancer Neg Hx     Social History   Socioeconomic History  . Marital status: Married    Spouse name: Not on file  . Number of children: 0  . Years of education: college  . Highest education level: Not on file  Occupational History  . Occupation: Lexicographer  . Financial resource strain: Not on file  . Food insecurity:    Worry: Not on file    Inability: Not on file  . Transportation needs:    Medical: Not on file    Non-medical: Not on file  Tobacco Use  . Smoking status: Never Smoker  . Smokeless tobacco: Never Used  . Tobacco comment: Never used tobacco  Substance and Sexual Activity  . Alcohol use: No    Alcohol/week: 0.0 oz  . Drug use: No  . Sexual activity: Never    Partners: Male    Birth control/protection: Post-menopausal    Comment: lives alone, no dietary restrictions except avoid fresh veg, fruit, whole grains  Lifestyle  . Physical activity:    Days per week: Not on file    Minutes per session: Not on file  . Stress: Not on file  Relationships  . Social connections:    Talks on phone: Not on file    Gets together: Not on file    Attends religious service: Not on file    Active member of club or organization: Not on file    Attends meetings of clubs or organizations: Not on file    Relationship status: Not on file  . Intimate partner violence:    Fear of current or ex partner: Not on file    Emotionally abused: Not on file    Physically abused: Not on file    Forced sexual activity: Not on file  Other Topics Concern  . Not on file  Social History Narrative   Patient was married ([name]) - widow   Patient does not have any children.   Patient is  right-handed.   Patient has a BA degree.   One caffeine drink daily     Outpatient Medications Prior to Visit  Medication Sig Dispense Refill  . ALPRAZolam (XANAX) 0.25 MG tablet Take 1 tablet (0.25 mg total) by mouth 2 (two) times daily as needed for anxiety. 30 tablet 1  . amLODipine (NORVASC) 5 MG tablet TAKE 1 TABLET BY MOUTH  DAILY  90 tablet 3  . aspirin 81 MG tablet Take 81 mg by mouth daily.    . cholecalciferol (VITAMIN D) 1000 UNITS tablet Take 1,000 Units by mouth daily.      Marland Kitchen dicyclomine (BENTYL) 10 MG capsule Take 1 capsule (10 mg total) by mouth 4 (four) times daily as needed. 120 capsule 6  . esomeprazole (NEXIUM) 40 MG capsule Take 1 capsule (40 mg total) by mouth daily before breakfast. 30 capsule 11  . FOLBIC 2.5-25-2 MG TABS tablet TAKE 1 TABLET BY MOUTH EVERY DAY 90 tablet 1  . metFORMIN (GLUCOPHAGE-XR) 500 MG 24 hr tablet TAKE 1 TABLET BY MOUTH 3  TIMES DAILY WITH MEALS 270 tablet 0  . metoCLOPramide (REGLAN) 5 MG tablet TAKE 1 TABLET BY MOUTH TWO  TIMES DAILY 180 tablet 1  . metoprolol tartrate (LOPRESSOR) 50 MG tablet TAKE 1 AND 1/2 TABLETS BY  MOUTH TWICE DAILY 270 tablet 0  . Multiple Vitamin (MULTIVITAMIN) tablet Take 1 tablet by mouth daily.      . Multiple Vitamins-Calcium (VIACTIV MULTI-VITAMIN) CHEW Chew 2 each by mouth daily.    . ondansetron (ZOFRAN ODT) 8 MG disintegrating tablet Take 1 tablet (8 mg total) by mouth every 8 (eight) hours as needed for nausea or vomiting. 20 tablet 0  . ondansetron (ZOFRAN) 4 MG tablet Take 4 mg by mouth every 4 (four) hours as needed. For nausea      . PHARMACIST CHOICE LANCETS MISC USE 2 TIMES DAILY FOR DIABETIC TESTING 100 each 1  . potassium chloride (K-DUR,KLOR-CON) 10 MEQ tablet TAKE 1 TABLET BY MOUTH  DAILY 90 tablet 2  . rosuvastatin (CRESTOR) 10 MG tablet TAKE 1 TABLET BY MOUTH  DAILY 90 tablet 2  . glucose blood (ONE TOUCH ULTRA TEST) test strip USE 2 TIMES DAILY FOR DIABETIC TESTING 100 each 2  . losartan (COZAAR)  50 MG tablet Take 1 tablet (50 mg total) by mouth 2 (two) times daily. 180 tablet 0   No facility-administered medications prior to visit.     Allergies  Allergen Reactions  . Tramadol Other (See Comments)    Dizziness     Review of Systems  Constitutional: Negative for fever and malaise/fatigue.  HENT: Negative for congestion.   Eyes: Negative for blurred vision.  Respiratory: Negative for shortness of breath.   Cardiovascular: Negative for chest pain, palpitations and leg swelling.  Gastrointestinal: Negative for abdominal pain, blood in stool and nausea.  Genitourinary: Negative for dysuria and frequency.  Musculoskeletal: Negative for falls.  Skin: Negative for rash.  Neurological: Positive for headaches. Negative for dizziness, tingling, speech change, focal weakness and loss of consciousness.  Endo/Heme/Allergies: Negative for environmental allergies.  Psychiatric/Behavioral: Negative for depression. The patient is not nervous/anxious.        Objective:    Physical Exam  Constitutional: She is oriented to person, place, and time. She appears well-developed and well-nourished. No distress.  HENT:  Head: Normocephalic and atraumatic.  Nose: Nose normal.  Eyes: Right eye exhibits no discharge. Left eye exhibits no discharge.  Neck: Normal range of motion. Neck supple.  Cardiovascular: Normal rate and regular rhythm.  No murmur heard. Pulmonary/Chest: Effort normal and breath sounds normal.  Abdominal: Soft. Bowel sounds are normal. There is no tenderness.  Musculoskeletal: She exhibits no edema.  Neurological: She is alert and oriented to person, place, and time.  Skin: Skin is warm and dry.  Psychiatric: She has a normal mood and affect.  Nursing note and vitals reviewed.  BP (!) 118/52 (BP Location: Left Arm, Patient Position: Sitting, Cuff Size: Normal)   Pulse 62   Temp 98.1 F (36.7 C) (Oral)   Resp 18   Wt 173 lb 3.2 oz (78.6 kg)   LMP 02/07/1992    SpO2 97%   BMI 33.83 kg/m  Wt Readings from Last 3 Encounters:  07/12/17 173 lb 3.2 oz (78.6 kg)  06/15/17 173 lb 12.8 oz (78.8 kg)  05/23/17 172 lb 6.4 oz (78.2 kg)   BP Readings from Last 3 Encounters:  07/12/17 (!) 118/52  06/15/17 (!) 169/79  05/23/17 (!) 140/54     Immunization History  Administered Date(s) Administered  . Influenza Split 10/31/2010, 11/28/2011  . Influenza Whole 11/19/2007, 11/04/2008, 11/10/2009  . Influenza, High Dose Seasonal PF 11/11/2015, 11/21/2016  . Influenza,inj,Quad PF,6+ Mos 01/09/2013, 10/20/2013, 11/13/2014  . Pneumococcal Conjugate-13 09/11/2013  . Pneumococcal Polysaccharide-23 03/13/2007  . Td 03/02/2005    Health Maintenance  Topic Date Due  . TETANUS/TDAP  03/03/2015  . FOOT EXAM  11/13/2015  . OPHTHALMOLOGY EXAM  02/06/2017  . INFLUENZA VACCINE  09/06/2017  . HEMOGLOBIN A1C  01/05/2018  . DEXA SCAN  Completed  . PNA vac Low Risk Adult  Completed    Lab Results  Component Value Date   WBC 6.9 07/06/2017   HGB 13.1 07/06/2017   HCT 38.7 07/06/2017   PLT 211.0 07/06/2017   GLUCOSE 128 (H) 07/06/2017   CHOL 189 07/06/2017   TRIG 119.0 07/06/2017   HDL 63.40 07/06/2017   LDLDIRECT 123.7 04/13/2011   LDLCALC 102 (H) 07/06/2017   ALT 32 07/06/2017   AST 24 07/06/2017   NA 132 (L) 07/06/2017   K 4.2 07/06/2017   CL 95 (L) 07/06/2017   CREATININE 0.71 07/06/2017   BUN 15 07/06/2017   CO2 28 07/06/2017   TSH 1.72 07/06/2017   INR 0.88 01/16/2011   HGBA1C 6.3 07/06/2017   MICROALBUR 1.6 11/13/2014    Lab Results  Component Value Date   TSH 1.72 07/06/2017   Lab Results  Component Value Date   WBC 6.9 07/06/2017   HGB 13.1 07/06/2017   HCT 38.7 07/06/2017   MCV 85.2 07/06/2017   PLT 211.0 07/06/2017   Lab Results  Component Value Date   NA 132 (L) 07/06/2017   K 4.2 07/06/2017   CHLORIDE 101 07/08/2015   CO2 28 07/06/2017   GLUCOSE 128 (H) 07/06/2017   BUN 15 07/06/2017   CREATININE 0.71 07/06/2017    BILITOT 0.7 07/06/2017   ALKPHOS 56 07/06/2017   AST 24 07/06/2017   ALT 32 07/06/2017   PROT 7.1 07/06/2017   ALBUMIN 4.4 07/06/2017   CALCIUM 9.7 07/06/2017   ANIONGAP 10 05/14/2017   EGFR 75 (L) 07/08/2015   GFR 85.01 07/06/2017   Lab Results  Component Value Date   CHOL 189 07/06/2017   Lab Results  Component Value Date   HDL 63.40 07/06/2017   Lab Results  Component Value Date   LDLCALC 102 (H) 07/06/2017   Lab Results  Component Value Date   TRIG 119.0 07/06/2017   Lab Results  Component Value Date   CHOLHDL 3 07/06/2017   Lab Results  Component Value Date   HGBA1C 6.3 07/06/2017         Assessment & Plan:   Problem List Items Addressed This Visit    Diabetes mellitus type 2 in obese ([place]) (Chronic)    Given and reviewed copy of ACP documents from [employer] of  State and encouraged to complete and return      Relevant Medications   losartan (COZAAR) 50 MG tablet   Essential hypertension (Chronic)    Well controlled, no changes to meds. Encouraged heart healthy diet such as the DASH diet and exercise as tolerated.       Relevant Medications   losartan (COZAAR) 50 MG tablet   HYPERCHOLESTEROLEMIA    Encouraged heart healthy diet, increase exercise, avoid trans fats, consider a krill oil cap daily      Relevant Medications   losartan (COZAAR) 50 MG tablet   IBS (irritable bowel syndrome)    Continue probiotic and high fiber. iproved      Vitamin D deficiency    Taking a daily supplement      Diabetic peripheral neuropathy ([facility])    hgba1c acceptable, minimize simple carbs. Increase exercise as tolerated. Continue current meds. symptomas adequately managed.       Relevant Medications   losartan (COZAAR) 50 MG tablet   TMJ disease    Flared on left, painful to palpation. Use lidocaine gel bid and discuss new mouth guard with dentist. Check Sed rate and CRP       Headache    With some dizziness at times. Left sided headache off and on  over several months. Has a hsitory of DDD in nect and TMJ and ear pressure. No tinnitis. No change in vision, no change in hearing. Check labs.       Relevant Orders   Sedimentation rate   C-reactive protein      I am having Maryem N. Stallworth maintain her multivitamin, cholecalciferol, ondansetron, aspirin, esomeprazole, dicyclomine, VIACTIV MULTI-VITAMIN, ALPRAZolam, amLODipine, potassium chloride, rosuvastatin, metoCLOPramide, PHARMACIST CHOICE LANCETS, FOLBIC, ondansetron, metFORMIN, metoprolol tartrate, losartan, and glucose blood.  Meds ordered this encounter  Medications  . losartan (COZAAR) 50 MG tablet    Sig: Take 1 tablet (50 mg total) by mouth 2 (two) times daily.    Dispense:  180 tablet    Refill:  0  . glucose blood (ONE TOUCH ULTRA TEST) test strip    Sig: USE 2 TIMES DAILY FOR DIABETIC TESTING    Dispense:  700 each    Refill:  2    Directions for test strips and lancets are different    CMA served as scribe during this visit. History, Physical and Plan performed by medical provider. Documentation and orders reviewed and attested to.  Penni Homans, MD

## 2017-07-12 NOTE — Assessment & Plan Note (Signed)
Taking a daily supplement

## 2017-07-12 NOTE — Assessment & Plan Note (Signed)
Continue probiotic and high fiber. iproved

## 2017-07-12 NOTE — Assessment & Plan Note (Signed)
Flared on left, painful to palpation. Use lidocaine gel bid and discuss new mouth guard with dentist. Check Sed rate and CRP

## 2017-07-13 LAB — SEDIMENTATION RATE: Sed Rate: 24 mm/hr (ref 0–30)

## 2017-07-13 LAB — C-REACTIVE PROTEIN: CRP: 0.1 mg/dL — ABNORMAL LOW (ref 0.5–20.0)

## 2017-07-17 ENCOUNTER — Ambulatory Visit (HOSPITAL_BASED_OUTPATIENT_CLINIC_OR_DEPARTMENT_OTHER)
Admission: RE | Admit: 2017-07-17 | Discharge: 2017-07-17 | Disposition: A | Discharge status: Home / Self Care | Payer: Medicare Other | Source: Ambulatory Visit | Attending: Cardiology | Admitting: Cardiology

## 2017-07-17 DIAGNOSIS — I6523 Occlusion and stenosis of bilateral carotid arteries: Secondary | ICD-10-CM

## 2017-07-17 NOTE — Progress Notes (Signed)
Echocardiogram Complete carotid duplex has been performed.  [name] 07/17/2017, 1:56 PM

## 2017-07-18 ENCOUNTER — Other Ambulatory Visit: Payer: Self-pay | Admitting: Internal Medicine

## 2017-07-19 ENCOUNTER — Telehealth: Payer: Self-pay | Admitting: Cardiology

## 2017-07-19 NOTE — Telephone Encounter (Signed)
Patient made aware of results and verbalized understanding.  Notes recorded by Minus Breeding, MD on 07/18/2017 at 9:41 AM EDT No significant plaque. No need for follow up imaging or change in therapy at this point. Call Ms. Heggs with the results and send results to [name], MD

## 2017-07-19 NOTE — Telephone Encounter (Signed)
New message    Pt is calling asking for the results of her last test she had done.

## 2017-08-03 ENCOUNTER — Encounter: Payer: Self-pay | Admitting: Gastroenterology

## 2017-08-03 ENCOUNTER — Ambulatory Visit: Payer: Medicare Other | Admitting: Gastroenterology

## 2017-08-03 VITALS — BP 130/66 | HR 60 | Ht 60.0 in | Wt 173.4 lb

## 2017-08-03 DIAGNOSIS — R1032 Left lower quadrant pain: Secondary | ICD-10-CM | POA: Diagnosis not present

## 2017-08-03 MED ORDER — CIPROFLOXACIN HCL 500 MG PO TABS: 500.0000 mg | ORAL_TABLET | Freq: Two times a day (BID) | ORAL | 0 refills | Status: DC

## 2017-08-03 MED FILL — CIPROFLOXACIN HCL 500 MG TA: 500 | 10 days supply | Qty: 20 | Fill #0

## 2017-08-03 NOTE — Progress Notes (Addendum)
08/03/2017 [name] [id number] 1940-08-09   HISTORY OF PRESENT ILLNESS:  This is a 77 year old female who is a patient of Dr. Celesta Aver.  She presents to our office today with complaints of LLQ abdominal pain.  Has diverticulosis and has been treated for diverticulitis in the past (only treated with cipro last time although I am not sure why).  Anyway, pain has been present for several weeks.  Very sore and radiates into right pelvis/groin.  X-ray in April showed stool in the colon so she drank a bottle of mag citrate, which helped her move her bowels, but pain still there.  Gets a little better after BM but always still present.  No nausea, vomiting, fever.  Bowels typically alternate from loose stools to constipation.  Has dicyclomine, which she has been taking and it helps somewhat for a while.     Past Medical History:  Diagnosis Date  . Abdominal pain in female 03/18/2010   Qualifier: Diagnosis of  By: Carlean Purl MD, Dimas Millin Anemia 06/08/2014  . Anxiety   . Arthritis    Spinal Osteoarthritis  . Cancer ([place])   . Carcinoid tumor of stomach   . Cataract   . Chest pain    Myoview 12/15 no ischemia.  . Chronic kidney disease    Left kidney smaller than right kidney  . Constipation 11/21/2016  . Diabetes mellitus type 2 in obese ([place]) 09/05/2006   Qualifier: Diagnosis of  By: Marca Ancona RMA, Lucy    . Diabetic peripheral neuropathy ([place]) 10/29/2013  . Diverticulosis 08/30/2000   Colonoscopy   . Encounter for preventative adult health care exam with abnormal findings 09/14/2013  . Esophageal reflux   . Gastric polyp    Fundic Gland  . Gastroparesis   . Headache(784.0)   . Heart murmur    Echocardiogram 2/11: EF 60-65%, mild LAE, grade 1 diastolic dysfunction, aortic valve sclerosis, mean gradient 9 mm of mercury, PASP 34  . Hematuria 03/16/2016  . Iron deficiency anemia, unspecified   . 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 Leg swelling    bilateral  . Neck pain 04/22/2015  .  PONV (postoperative nausea and vomiting)    pt states only needs small amount of anesthesia  . PSVT (paroxysmal supraventricular tachycardia) ([place])   . Pure hypercholesterolemia   . 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 Stroke ([place])    tia, 2014  . 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 Type II or unspecified type diabetes mellitus without mention of complication, not stated as uncontrolled   . Unspecified essential hypertension   . Unspecified hereditary and idiopathic peripheral neuropathy 10/29/2013   Past Surgical History:  Procedure Laterality Date  . CHOLECYSTECTOMY  1993  . COLONOSCOPY  11/11/2010   diverticulosis  . DILATATION & CURRETTAGE/HYSTEROSCOPY WITH RESECTOCOPE N/A 02/25/2013   Procedure: Attempted hysteroscopy with uterine perforation;  Surgeon: Jamey Reas de Berton Lan, MD;  Location: [facility];  Service: Gynecology;  Laterality: N/A;  . ESOPHAGOGASTRODUODENOSCOPY  08/29/2010; 09/15/2010   Carcinoid tumor less than 1 cm in July 2012 not seen in August 2012 , gastritis, fundic gland polyps  . ESOPHAGOGASTRODUODENOSCOPY  05/16/2011  . ESOPHAGOGASTRODUODENOSCOPY  06/14/2012  . EUS  12/15/2010   Procedure: UPPER ENDOSCOPIC ULTRASOUND (EUS) LINEAR;  Surgeon: Owens Loffler, MD;  Location: [facility];  Service: Endoscopy;  Laterality: N/A;  . EYE SURGERY Bilateral    Bi lateral cateracts and bi lateral laser  . LAPAROSCOPY N/A 02/25/2013  Procedure: Cystoscopy and laparoscopy with fulguration of uterine serosa;  Surgeon: Jamey Reas de Berton Lan, MD;  Location: [facility];  Service: Gynecology;  Laterality: N/A;  . TONSILLECTOMY      reports that she has never smoked. She has never used smokeless tobacco. She reports that she does not drink alcohol or use drugs. family history includes Diabetes in her brother, maternal grandmother, mother, sister, and sister; Heart disease in her brother, brother, sister, sister, and sister; Hyperlipidemia in her brother, sister, and sister; Hypertension in  her brother, paternal grandmother, sister, and sister; Stroke in her father. Allergies  Allergen Reactions  . Tramadol Other (See Comments)    Dizziness       Outpatient Encounter Medications as of 08/03/2017  Medication Sig  . ALPRAZolam (XANAX) 0.25 MG tablet Take 1 tablet (0.25 mg total) by mouth 2 (two) times daily as needed for anxiety.  Marland Kitchen amLODipine (NORVASC) 5 MG tablet TAKE 1 TABLET BY MOUTH  DAILY  . aspirin 81 MG tablet Take 81 mg by mouth daily.  . cholecalciferol (VITAMIN D) 1000 UNITS tablet Take 1,000 Units by mouth daily.    Marland Kitchen dicyclomine (BENTYL) 10 MG capsule TAKE ONE CAPSULE BY MOUTH 4 TIMES DAILY AS NEEDED  . esomeprazole (NEXIUM) 40 MG capsule Take 1 capsule (40 mg total) by mouth daily before breakfast.  . FOLBIC 2.5-25-2 MG TABS tablet TAKE 1 TABLET BY MOUTH EVERY DAY  . glucose blood (ONE TOUCH ULTRA TEST) test strip USE 2 TIMES DAILY FOR DIABETIC TESTING  . losartan (COZAAR) 50 MG tablet Take 1 tablet (50 mg total) by mouth 2 (two) times daily.  . metFORMIN (GLUCOPHAGE-XR) 500 MG 24 hr tablet TAKE 1 TABLET BY MOUTH 3  TIMES DAILY WITH MEALS  . metoCLOPramide (REGLAN) 5 MG tablet TAKE 1 TABLET BY MOUTH TWO  TIMES DAILY  . metoprolol tartrate (LOPRESSOR) 50 MG tablet TAKE 1 AND 1/2 TABLETS BY  MOUTH TWICE DAILY  . Multiple Vitamin (MULTIVITAMIN) tablet Take 1 tablet by mouth daily.    . Multiple Vitamins-Calcium (VIACTIV MULTI-VITAMIN) CHEW Chew 2 each by mouth daily.  . ondansetron (ZOFRAN) 4 MG tablet Take 4 mg by mouth every 4 (four) hours as needed. For nausea    . PHARMACIST CHOICE LANCETS MISC USE 2 TIMES DAILY FOR DIABETIC TESTING  . potassium chloride (K-DUR,KLOR-CON) 10 MEQ tablet TAKE 1 TABLET BY MOUTH  DAILY  . rosuvastatin (CRESTOR) 10 MG tablet TAKE 1 TABLET BY MOUTH  DAILY  . [DISCONTINUED] ondansetron (ZOFRAN ODT) 8 MG disintegrating tablet Take 1 tablet (8 mg total) by mouth every 8 (eight) hours as needed for nausea or vomiting.   No  facility-administered encounter medications on file as of 08/03/2017.      REVIEW OF SYSTEMS  : All other systems reviewed and negative except where noted in the History of Present Illness.   PHYSICAL EXAM: BP 130/66   Pulse 60   Ht 5' (1.524 m)   Wt 173 lb 6 oz (78.6 kg)   LMP 02/07/1992   BMI 33.86 kg/m  General: Well developed female in no acute distress Head: Normocephalic and atraumatic Eyes:  Sclerae anicteric, conjunctiva pink. Ears: Normal auditory acuity Lungs: Clear throughout to auscultation; no increased WOB. Heart: Regular rate and rhythm; no M/R/G. Abdomen: Soft, non-distended.  BS present.  LLQ TTP. Musculoskeletal: Symmetrical with no gross deformities  Skin: No lesions on visible extremities Extremities: No edema  Neurological: Alert oriented x 4, grossly non-focal Psychological:  Alert and cooperative. Normal mood and affect  ASSESSMENT AND PLAN: *LLQ abdominal pain for several weeks:  Has diverticulosis and was treated for diverticulitis on at least one other occasion in the past.  Will treat with cipro 500 mg BID for 10 days.  She was not treated with flagyl last time (not sure why) so we will hold off with using that again this time as well.  Soft, low fiber diet while on medication and then following treatment and recovery needs high fiber diet.  Will start Benefiber powder twice daily (only uses it on occasion currently).  May need addition of Miralax in the future as well.  She is going to call back after completing treatment (sooner if symptoms worsen) to give an update.  If no better then needs CT scan abdomen and pelvis with contrast.   CC:  [name], MD  Agree with Ms. Alphia Kava management.  Gatha Mayer, MD, Marval Regal

## 2017-08-03 NOTE — Patient Instructions (Signed)
We have sent the following medications to your pharmacy for you to pick up at your convenience:  Cipro 500 mg twice a day for 10 days   Stay on a low fiber diet while on antibiotics.   Once done with antibiotics start a high fiber diet and Benefiber once daily  Please call the office with an update of your symptoms in the next one week and ask for Koren Shiver, Therapist, sports, BSN.

## 2017-08-06 ENCOUNTER — Other Ambulatory Visit: Payer: Self-pay | Admitting: Family Medicine

## 2017-08-06 ENCOUNTER — Ambulatory Visit: Payer: Medicare Other | Admitting: Medical

## 2017-08-06 NOTE — Telephone Encounter (Signed)
Copied from [place] [id number]. Topic: Quick Communication - Rx Refill/Question >> Aug 06, 2017  3:56 PM Synthia Innocent wrote: Medication: glucose blood (ONE TOUCH ULTRA TEST) test strip, & kit  Has the patient contacted their pharmacy? Yes.   (Agent: If no, request that the patient contact the pharmacy for the refill.) (Agent: If yes, when and what did the pharmacy advise?)  Preferred Pharmacy (with phone number or street name): [facility]  Agent: Please be advised that RX refills may take up to 3 business days. We ask that you follow-up with your pharmacy. Ref # [id number]

## 2017-08-07 ENCOUNTER — Other Ambulatory Visit: Payer: Self-pay

## 2017-08-07 ENCOUNTER — Encounter: Payer: Self-pay | Admitting: Family

## 2017-08-07 ENCOUNTER — Ambulatory Visit (INDEPENDENT_AMBULATORY_CARE_PROVIDER_SITE_OTHER): Payer: Medicare Other | Admitting: Family

## 2017-08-07 VITALS — BP 138/71 | HR 58 | Temp 98.4°F | Resp 16 | Ht 60.0 in | Wt 174.4 lb

## 2017-08-07 DIAGNOSIS — L989 Disorder of the skin and subcutaneous tissue, unspecified: Secondary | ICD-10-CM | POA: Diagnosis not present

## 2017-08-07 MED ORDER — BETAMETHASONE DIPROPIONATE 0.05 % EX CREA: TOPICAL_CREAM | Freq: Two times a day (BID) | CUTANEOUS | 0 refills | Status: DC

## 2017-08-07 MED ORDER — FREESTYLE SYSTEM KIT: 1.0000 | PACK | 0 refills | Status: DC | PRN

## 2017-08-07 MED FILL — BETAMETHASONE DP 0.05% CRM: 0.05 | 7 days supply | Qty: 15 | Fill #0

## 2017-08-07 NOTE — Progress Notes (Signed)
Subjective:    [name], female    DOB: April 06, 1940, 77 y.o.   MRN: [id number]  HPI  Patient is a 77 yr old female who presents today with chief complaint of irritated mole on her back. Reports that 4-5 days ago it began itching.    Review of Systems See HPI  Past Medical History:  Diagnosis Date  . Abdominal pain in female 03/18/2010   Qualifier: Diagnosis of  By: Carlean Purl MD, Dimas Millin Anemia 06/08/2014  . Anxiety   . Arthritis    Spinal Osteoarthritis  . Cancer ([place])   . Carcinoid tumor of stomach   . Cataract   . Chest pain    Myoview 12/15 no ischemia.  . Chronic kidney disease    Left kidney smaller than right kidney  . Constipation 11/21/2016  . Diabetes mellitus type 2 in obese ([place]) 09/05/2006   Qualifier: Diagnosis of  By: Marca Ancona RMA, Lucy    . Diabetic peripheral neuropathy ([place]) 10/29/2013  . Diverticulosis 08/30/2000   Colonoscopy   . Encounter for preventative adult health care exam with abnormal findings 09/14/2013  . Esophageal reflux   . Gastric polyp    Fundic Gland  . Gastroparesis   . Headache(784.0)   . Heart murmur    Echocardiogram 2/11: EF 60-65%, mild LAE, grade 1 diastolic dysfunction, aortic valve sclerosis, mean gradient 9 mm of mercury, PASP 34  . Hematuria 03/16/2016  . Iron deficiency anemia, unspecified   . 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 Leg swelling    bilateral  . Neck pain 04/22/2015  . PONV (postoperative nausea and vomiting)    pt states only needs small amount of anesthesia  . PSVT (paroxysmal supraventricular tachycardia) ([place])   . Pure hypercholesterolemia   . 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 Stroke ([place])    tia, 2014  . 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 Type II or unspecified type diabetes mellitus without mention of complication, not stated as uncontrolled   . Unspecified essential hypertension   . Unspecified hereditary and idiopathic peripheral neuropathy 10/29/2013     Social History   Socioeconomic History    . Marital status: Widowed    Spouse name: Not on file  . Number of children: 0  . Years of education: college  . Highest education level: Not on file  Occupational History  . Occupation: retired  Scientific laboratory technician  . Financial resource strain: Not on file  . Food insecurity:    Worry: Not on file    Inability: Not on file  . Transportation needs:    Medical: Not on file    Non-medical: Not on file  Tobacco Use  . Smoking status: Never Smoker  . Smokeless tobacco: Never Used  . Tobacco comment: Never used tobacco  Substance and Sexual Activity  . Alcohol use: No    Alcohol/week: 0.0 oz  . Drug use: No  . Sexual activity: Never    Partners: Male    Birth control/protection: Post-menopausal    Comment: lives alone, no dietary restrictions except avoid fresh veg, fruit, whole grains  Lifestyle  . Physical activity:    Days per week: Not on file    Minutes per session: Not on file  . Stress: Not on file  Relationships  . Social connections:    Talks on phone: Not on file    Gets together: Not on file    Attends religious service: Not on file    Active  member of club or organization: Not on file    Attends meetings of clubs or organizations: Not on file    Relationship status: Not on file  . Intimate partner violence:    Fear of current or ex partner: Not on file    Emotionally abused: Not on file    Physically abused: Not on file    Forced sexual activity: Not on file  Other Topics Concern  . Not on file  Social History Narrative   Patient was married ([name]) - widow   Patient does not have any children.   Patient is right-handed.   Patient has a BA degree.   One caffeine drink daily     Past Surgical History:  Procedure Laterality Date  . CHOLECYSTECTOMY  1993  . COLONOSCOPY  11/11/2010   diverticulosis  . DILATATION & CURRETTAGE/HYSTEROSCOPY WITH RESECTOCOPE N/A 02/25/2013   Procedure: Attempted hysteroscopy with uterine perforation;  Surgeon: Jamey Reas de  Berton Lan, MD;  Location: [facility];  Service: Gynecology;  Laterality: N/A;  . ESOPHAGOGASTRODUODENOSCOPY  08/29/2010; 09/15/2010   Carcinoid tumor less than 1 cm in July 2012 not seen in August 2012 , gastritis, fundic gland polyps  . ESOPHAGOGASTRODUODENOSCOPY  05/16/2011  . ESOPHAGOGASTRODUODENOSCOPY  06/14/2012  . EUS  12/15/2010   Procedure: UPPER ENDOSCOPIC ULTRASOUND (EUS) LINEAR;  Surgeon: Owens Loffler, MD;  Location: [facility];  Service: Endoscopy;  Laterality: N/A;  . EYE SURGERY Bilateral    Bi lateral cateracts and bi lateral laser  . LAPAROSCOPY N/A 02/25/2013   Procedure: Cystoscopy and laparoscopy with fulguration of uterine serosa;  Surgeon: Jamey Reas de Berton Lan, MD;  Location: [facility];  Service: Gynecology;  Laterality: N/A;  . TONSILLECTOMY      Family History  Problem Relation Age of Onset  . Diabetes Mother   . Stroke Father        deceased age 44  . Heart disease Sister        deceased MI age 26  . Heart disease Brother        deceased MI age 58  . Diabetes Maternal Grandmother   . Hypertension Paternal Grandmother   . Diabetes Sister   . Heart disease Sister   . Hypertension Sister   . Hyperlipidemia Sister   . Diabetes Sister   . Heart disease Sister   . Hypertension Sister   . Hyperlipidemia Sister   . Diabetes Brother   . Heart disease Brother   . Hypertension Brother   . Hyperlipidemia Brother   . Colon cancer Neg Hx   . Esophageal cancer Neg Hx   . Stomach cancer Neg Hx   . Rectal cancer Neg Hx     Allergies  Allergen Reactions  . Tramadol Other (See Comments)    Dizziness     Current Outpatient Medications on File Prior to Visit  Medication Sig Dispense Refill  . ALPRAZolam (XANAX) 0.25 MG tablet Take 1 tablet (0.25 mg total) by mouth 2 (two) times daily as needed for anxiety. 30 tablet 1  . amLODipine (NORVASC) 5 MG tablet TAKE 1 TABLET BY MOUTH  DAILY 90 tablet 3  . aspirin 81 MG tablet Take 81 mg by mouth daily.    .  cholecalciferol (VITAMIN D) 1000 UNITS tablet Take 1,000 Units by mouth daily.      . ciprofloxacin (CIPRO) 500 MG tablet Take 1 tablet (500 mg total) by mouth 2 (two) times daily. 20 tablet 0  . dicyclomine (  BENTYL) 10 MG capsule TAKE ONE CAPSULE BY MOUTH 4 TIMES DAILY AS NEEDED 120 capsule 1  . esomeprazole (NEXIUM) 40 MG capsule Take 1 capsule (40 mg total) by mouth daily before breakfast. 30 capsule 11  . FOLBIC 2.5-25-2 MG TABS tablet TAKE 1 TABLET BY MOUTH EVERY DAY 90 tablet 1  . glucose blood (ONE TOUCH ULTRA TEST) test strip USE 2 TIMES DAILY FOR DIABETIC TESTING 700 each 2  . losartan (COZAAR) 50 MG tablet Take 1 tablet (50 mg total) by mouth 2 (two) times daily. 180 tablet 0  . metFORMIN (GLUCOPHAGE-XR) 500 MG 24 hr tablet TAKE 1 TABLET BY MOUTH 3  TIMES DAILY WITH MEALS 270 tablet 0  . metoCLOPramide (REGLAN) 5 MG tablet TAKE 1 TABLET BY MOUTH TWO  TIMES DAILY 180 tablet 1  . metoprolol tartrate (LOPRESSOR) 50 MG tablet TAKE 1 AND 1/2 TABLETS BY  MOUTH TWICE DAILY 270 tablet 0  . Multiple Vitamin (MULTIVITAMIN) tablet Take 1 tablet by mouth daily.      . Multiple Vitamins-Calcium (VIACTIV MULTI-VITAMIN) CHEW Chew 2 each by mouth daily.    . ondansetron (ZOFRAN) 4 MG tablet Take 4 mg by mouth every 4 (four) hours as needed. For nausea      . PHARMACIST CHOICE LANCETS MISC USE 2 TIMES DAILY FOR DIABETIC TESTING 100 each 1  . potassium chloride (K-DUR,KLOR-CON) 10 MEQ tablet TAKE 1 TABLET BY MOUTH  DAILY 90 tablet 2  . rosuvastatin (CRESTOR) 10 MG tablet TAKE 1 TABLET BY MOUTH  DAILY 90 tablet 2   No current facility-administered medications on file prior to visit.     BP 138/71 (BP Location: Left Arm, Patient Position: Sitting, Cuff Size: Small)   Pulse (!) 58   Temp 98.4 F (36.9 C) (Oral)   Resp 16   Ht 5' (1.524 m)   Wt 174 lb 6.4 oz (79.1 kg)   LMP 02/07/1992   SpO2 100%   BMI 34.06 kg/m       Objective:   Physical Exam  Constitutional: She appears well-developed  and well-nourished. No distress.  Skin: Skin is warm and dry.  Raised benign appearing lesion noted on upper back.            Assessment & Plan:  Skin lesion- advises pt to apply betamethasone bid as needed for itching and to keep her upcoming appointment with dermatology for further evaluation.

## 2017-08-07 NOTE — Patient Instructions (Signed)
Apply betamethasone cream twice daily as needed for itching. Keep your upcoming appointment with dermatology.

## 2017-08-07 NOTE — Telephone Encounter (Signed)
LOV  08/07/17 Dr. Charlett Blake Test strips and kit

## 2017-08-07 NOTE — Telephone Encounter (Addendum)
Sent order for Glucometer and test strips to [facility].

## 2017-08-20 DIAGNOSIS — L820 Inflamed seborrheic keratosis: Secondary | ICD-10-CM | POA: Diagnosis not present

## 2017-08-20 DIAGNOSIS — L821 Other seborrheic keratosis: Secondary | ICD-10-CM | POA: Diagnosis not present

## 2017-08-30 ENCOUNTER — Other Ambulatory Visit: Payer: Self-pay | Admitting: Family Medicine

## 2017-08-30 ENCOUNTER — Other Ambulatory Visit: Payer: Self-pay | Admitting: Cardiology

## 2017-08-30 NOTE — Telephone Encounter (Signed)
Rx request sent to pharmacy.  

## 2017-09-11 ENCOUNTER — Telehealth: Payer: Self-pay | Admitting: Family Medicine

## 2017-09-11 MED ORDER — GLUCOSE BLOOD VI STRP: ORAL_STRIP | 2 refills | Status: DC

## 2017-09-11 MED ORDER — PHARMACIST CHOICE LANCETS MISC: 1 refills | Status: DC

## 2017-09-11 MED FILL — ONE TOUCH DELICA 33G LANCET: 50 days supply | Qty: 100 | Fill #0

## 2017-09-11 MED FILL — ONE TOUCH ULTRA TEST STRIPS: 75 days supply | Qty: 150 | Fill #0

## 2017-09-11 NOTE — Telephone Encounter (Signed)
Copied from CRM #[id number]. Topic: Quick Communication - Rx Refill/Question >> Sep 11, 2017  4:10 PM Harris, Brenda J wrote: Medication:  glucose blood (ONE TOUCH ULTRA TEST) test strip   glucose monitoring kit (FREESTYLE) monitoring kit  PHARMACIST CHOICE LANCETS MISC    Patient called to request a refill for the above medications.  CB# [phone]  Preferred Pharmacy (with phone number or street name): [facility] - [place], [place] - [address] [phone] (Phone) [phone] (Fax)      

## 2017-09-11 NOTE — Telephone Encounter (Signed)
Incoming call from patient requesting refill for Test strips and Lancets    LOV 6/ 6/19    Last refill: 07/12/17 on test strips                 : 04/13/17 on lancets  Pharmacy:  [facility]     PCP Dr. Willette Alma

## 2017-10-04 ENCOUNTER — Other Ambulatory Visit: Payer: Self-pay | Admitting: Emergency Medicine

## 2017-10-04 ENCOUNTER — Other Ambulatory Visit (INDEPENDENT_AMBULATORY_CARE_PROVIDER_SITE_OTHER): Payer: Medicare Other

## 2017-10-04 DIAGNOSIS — I10 Essential (primary) hypertension: Secondary | ICD-10-CM

## 2017-10-04 DIAGNOSIS — E669 Obesity, unspecified: Secondary | ICD-10-CM

## 2017-10-04 DIAGNOSIS — E559 Vitamin D deficiency, unspecified: Secondary | ICD-10-CM | POA: Diagnosis not present

## 2017-10-04 DIAGNOSIS — E7800 Pure hypercholesterolemia, unspecified: Secondary | ICD-10-CM | POA: Diagnosis not present

## 2017-10-04 DIAGNOSIS — E1169 Type 2 diabetes mellitus with other specified complication: Secondary | ICD-10-CM

## 2017-10-04 LAB — COMPREHENSIVE METABOLIC PANEL
ALT: 35 U/L (ref 0–35)
AST: 24 U/L (ref 0–37)
Albumin: 4.3 g/dL (ref 3.5–5.2)
Alkaline Phosphatase: 59 U/L (ref 39–117)
BUN: 19 mg/dL (ref 6–23)
CHLORIDE: 97 meq/L (ref 96–112)
CO2: 27 mEq/L (ref 19–32)
Calcium: 9.7 mg/dL (ref 8.4–10.5)
Creatinine, Ser: 0.7 mg/dL (ref 0.40–1.20)
GFR: 86.35 mL/min (ref >60.00)
GLUCOSE: 119 mg/dL — AB (ref 70–99)
POTASSIUM: 4.1 meq/L (ref 3.5–5.1)
SODIUM: 132 meq/L — AB (ref 135–145)
Total Bilirubin: 0.8 mg/dL (ref 0.2–1.2)
Total Protein: 7 g/dL (ref 6.0–8.3)

## 2017-10-04 LAB — HEMOGLOBIN A1C: Hgb A1c MFr Bld: 6.5 % (ref 4.6–6.5)

## 2017-10-04 LAB — LIPID PANEL
CHOL/HDL RATIO: 3
Cholesterol: 182 mg/dL (ref 0–200)
HDL: 67.8 mg/dL (ref >39.00)
LDL CALC: 93 mg/dL (ref 0–99)
NONHDL: 114.04
TRIGLYCERIDES: 104 mg/dL (ref 0.0–149.0)
VLDL: 20.8 mg/dL (ref 0.0–40.0)

## 2017-10-04 LAB — CBC
HEMATOCRIT: 37.7 % (ref 36.0–46.0)
Hemoglobin: 12.7 g/dL (ref 12.0–15.0)
MCHC: 33.7 g/dL (ref 30.0–36.0)
MCV: 85.3 fl (ref 78.0–100.0)
Platelets: 216 10*3/uL (ref 150.0–400.0)
RBC: 4.42 Mil/uL (ref 3.87–5.11)
RDW: 12.6 % (ref 11.5–15.5)
WBC: 6.8 10*3/uL (ref 4.0–10.5)

## 2017-10-04 LAB — VITAMIN D 25 HYDROXY (VIT D DEFICIENCY, FRACTURES): VITD: 48.69 ng/mL (ref 30.00–100.00)

## 2017-10-04 LAB — TSH: TSH: 2.28 u[IU]/mL (ref 0.35–4.50)

## 2017-10-04 NOTE — Progress Notes (Signed)
vitd

## 2017-10-11 ENCOUNTER — Encounter: Payer: Self-pay | Admitting: Family Medicine

## 2017-10-11 ENCOUNTER — Ambulatory Visit (INDEPENDENT_AMBULATORY_CARE_PROVIDER_SITE_OTHER): Payer: Medicare Other | Admitting: Family Medicine

## 2017-10-11 ENCOUNTER — Ambulatory Visit (HOSPITAL_BASED_OUTPATIENT_CLINIC_OR_DEPARTMENT_OTHER)
Admission: RE | Admit: 2017-10-11 | Discharge: 2017-10-11 | Disposition: A | Discharge status: Home / Self Care | Payer: Medicare Other | Source: Ambulatory Visit | Attending: Family Medicine | Admitting: Family Medicine

## 2017-10-11 VITALS — BP 140/70 | HR 61 | Temp 97.5°F | Resp 18 | Ht 60.0 in | Wt 173.6 lb

## 2017-10-11 DIAGNOSIS — K582 Mixed irritable bowel syndrome: Secondary | ICD-10-CM | POA: Diagnosis not present

## 2017-10-11 DIAGNOSIS — M542 Cervicalgia: Secondary | ICD-10-CM

## 2017-10-11 DIAGNOSIS — M5030 Other cervical disc degeneration, unspecified cervical region: Secondary | ICD-10-CM | POA: Diagnosis not present

## 2017-10-11 DIAGNOSIS — E559 Vitamin D deficiency, unspecified: Secondary | ICD-10-CM | POA: Diagnosis not present

## 2017-10-11 DIAGNOSIS — J329 Chronic sinusitis, unspecified: Secondary | ICD-10-CM | POA: Diagnosis not present

## 2017-10-11 DIAGNOSIS — M47812 Spondylosis without myelopathy or radiculopathy, cervical region: Secondary | ICD-10-CM | POA: Diagnosis not present

## 2017-10-11 MED ORDER — CEFDINIR 300 MG PO CAPS: 300.0000 mg | ORAL_CAPSULE | Freq: Two times a day (BID) | ORAL | 0 refills | Status: AC

## 2017-10-11 MED ORDER — LOSARTAN POTASSIUM 50 MG PO TABS: 50.0000 mg | ORAL_TABLET | Freq: Two times a day (BID) | ORAL | 1 refills | Status: DC

## 2017-10-11 MED ORDER — METHYLPREDNISOLONE 4 MG PO TABS: ORAL_TABLET | ORAL | 0 refills | Status: DC

## 2017-10-11 MED ORDER — ESOMEPRAZOLE MAGNESIUM 40 MG PO CPDR: 40.0000 mg | DELAYED_RELEASE_CAPSULE | Freq: Every day | ORAL | 6 refills | Status: DC

## 2017-10-11 MED FILL — METHYLPREDNISOLONE 4 MG TAB: 4 | 5 days supply | Qty: 15 | Fill #0

## 2017-10-11 MED FILL — CEFDINIR 300 MG CAPS: 300 | 10 days supply | Qty: 20 | Fill #0

## 2017-10-11 NOTE — Patient Instructions (Signed)
Check with insurance regarding whom in Dermatology they will let you see and let us know Carbohydrate Counting for Diabetes Mellitus, Adult Carbohydrate counting is a method for keeping track of how many carbohydrates you eat. Eating carbohydrates naturally increases the amount of sugar (glucose) in the blood. Counting how many carbohydrates you eat helps keep your blood glucose within normal limits, which helps you manage your diabetes (diabetes mellitus). It is important to know how many carbohydrates you can safely have in each meal. This is different for every person. A diet and nutrition specialist (registered dietitian) can help you make a meal plan and calculate how many carbohydrates you should have at each meal and snack. Carbohydrates are found in the following foods:  Grains, such as breads and cereals.  Dried beans and soy products.  Starchy vegetables, such as potatoes, peas, and corn.  Fruit and fruit juices.  Milk and yogurt.  Sweets and snack foods, such as cake, cookies, candy, chips, and soft drinks.  How do I count carbohydrates? There are two ways to count carbohydrates in food. You can use either of the methods or a combination of both. Reading "Nutrition Facts" on packaged food The "Nutrition Facts" list is included on the labels of almost all packaged foods and beverages in the [place] It includes:  The serving size.  Information about nutrients in each serving, including the grams (g) of carbohydrate per serving.  To use the "Nutrition Facts":  Decide how many servings you will have.  Multiply the number of servings by the number of carbohydrates per serving.  The resulting number is the total amount of carbohydrates that you will be having.  Learning standard serving sizes of other foods When you eat foods containing carbohydrates that are not packaged or do not include "Nutrition Facts" on the label, you need to measure the servings in order to count the  amount of carbohydrates:  Measure the foods that you will eat with a food scale or measuring cup, if needed.  Decide how many standard-size servings you will eat.  Multiply the number of servings by 15. Most carbohydrate-rich foods have about 15 g of carbohydrates per serving. ? For example, if you eat 8 oz (170 g) of strawberries, you will have eaten 2 servings and 30 g of carbohydrates (2 servings x 15 g = 30 g).  For foods that have more than one food mixed, such as soups and casseroles, you must count the carbohydrates in each food that is included.  The following list contains standard serving sizes of common carbohydrate-rich foods. Each of these servings has about 15 g of carbohydrates:   hamburger bun or  English muffin.   oz (15 mL) syrup.   oz (14 g) jelly.  1 slice of bread.  1 six-inch tortilla.  3 oz (85 g) cooked rice or pasta.  4 oz (113 g) cooked dried beans.  4 oz (113 g) starchy vegetable, such as peas, corn, or potatoes.  4 oz (113 g) hot cereal.  4 oz (113 g) mashed potatoes or  of a large baked potato.  4 oz (113 g) canned or frozen fruit.  4 oz (120 mL) fruit juice.  4-6 crackers.  6 chicken nuggets.  6 oz (170 g) unsweetened dry cereal.  6 oz (170 g) plain fat-free yogurt or yogurt sweetened with artificial sweeteners.  8 oz (240 mL) milk.  8 oz (170 g) fresh fruit or one small piece of fruit.  24 oz (680 g)  popped popcorn.  Example of carbohydrate counting Sample meal  3 oz (85 g) chicken breast.  6 oz (170 g) brown rice.  4 oz (113 g) corn.  8 oz (240 mL) milk.  8 oz (170 g) strawberries with sugar-free whipped topping. Carbohydrate calculation 1. Identify the foods that contain carbohydrates: ? Rice. ? Corn. ? Milk. ? Strawberries. 2. Calculate how many servings you have of each food: ? 2 servings rice. ? 1 serving corn. ? 1 serving milk. ? 1 serving strawberries. 3. Multiply each number of servings by 15 g: ? 2  servings rice x 15 g = 30 g. ? 1 serving corn x 15 g = 15 g. ? 1 serving milk x 15 g = 15 g. ? 1 serving strawberries x 15 g = 15 g. 4. Add together all of the amounts to find the total grams of carbohydrates eaten: ? 30 g + 15 g + 15 g + 15 g = 75 g of carbohydrates total. This information is not intended to replace advice given to you by your health care provider. Make sure you discuss any questions you have with your health care provider. Document Released: 01/23/2005 Document Revised: 08/13/2015 Document Reviewed: 07/07/2015 Elsevier Interactive Patient Education  [employer].

## 2017-10-11 NOTE — Progress Notes (Signed)
Subjective:  I acted as a Education administrator for Dr. Charlett Blake. [name], [place]  [name], female    DOB: Nov 18, 1940, 77 y.o.   MRN: [id number]  No chief complaint on file.   HPI  Patient is in today for 3 month follow up and she is not feeling well. He right 4th finger is painful. She stated having pain and swelling in                           Vfbo. No recently edlll. Increase leafy greens, consider increased lean red meat and using cast iron cookware. Continue to monitor, report any concerns She is sleepingmore but still tired. She denies any fall or injury.. Patient Care Team: [name], MD as PCP - General (Family Medicine) Gatha Mayer, MD as Consulting Physician (Gastroenterology) Love, Alyson Locket, MD (Neurology) Minus Breeding, MD as Attending Physician (Cardiology)   Past Medical History:  Diagnosis Date  . Abdominal pain in female 03/18/2010   Qualifier: Diagnosis of  By: Carlean Purl MD, Dimas Millin Anemia 06/08/2014  . Anxiety   . Arthritis    Spinal Osteoarthritis  . Cancer ([place])   . Carcinoid tumor of stomach   . Cataract   . Chest pain    Myoview 12/15 no ischemia.  . Chronic kidney disease    Left kidney smaller than right kidney  . Constipation 11/21/2016  . Diabetes mellitus type 2 in obese ([place]) 09/05/2006   Qualifier: Diagnosis of  By: Marca Ancona RMA, Lucy    . Diabetic peripheral neuropathy ([place]) 10/29/2013  . Diverticulosis 08/30/2000   Colonoscopy   . Encounter for preventative adult health care exam with abnormal findings 09/14/2013  . Esophageal reflux   . Gastric polyp    Fundic Gland  . Gastroparesis   . Headache(784.0)   . Heart murmur    Echocardiogram 2/11: EF 60-65%, mild LAE, grade 1 diastolic dysfunction, aortic valve sclerosis, mean gradient 9 mm of mercury, PASP 34  . Hematuria 03/16/2016  . Iron deficiency anemia, unspecified   . 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 Leg swelling    bilateral  . Neck pain 04/22/2015  . PONV (postoperative  nausea and vomiting)    pt states only needs small amount of anesthesia  . PSVT (paroxysmal supraventricular tachycardia) ([place])   . Pure hypercholesterolemia   . 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 Stroke ([place])    tia, 2014  . 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 Type II or unspecified type diabetes mellitus without mention of complication, not stated as uncontrolled   . Unspecified essential hypertension   . Unspecified hereditary and idiopathic peripheral neuropathy 10/29/2013    Past Surgical History:  Procedure Laterality Date  . CHOLECYSTECTOMY  1993  . COLONOSCOPY  11/11/2010   diverticulosis  . DILATATION & CURRETTAGE/HYSTEROSCOPY WITH RESECTOCOPE N/A 02/25/2013   Procedure: Attempted hysteroscopy with uterine perforation;  Surgeon: Jamey Reas de Berton Lan, MD;  Location: [facility];  Service: Gynecology;  Laterality: N/A;  . ESOPHAGOGASTRODUODENOSCOPY  08/29/2010; 09/15/2010   Carcinoid tumor less than 1 cm in July 2012 not seen in August 2012 , gastritis, fundic gland polyps  . ESOPHAGOGASTRODUODENOSCOPY  05/16/2011  . ESOPHAGOGASTRODUODENOSCOPY  06/14/2012  . EUS  12/15/2010   Procedure: UPPER ENDOSCOPIC ULTRASOUND (EUS) LINEAR;  Surgeon: Owens Loffler, MD;  Location: [facility];  Service: Endoscopy;  Laterality: N/A;  . EYE SURGERY Bilateral    Bi lateral  cateracts and bi lateral laser  . LAPAROSCOPY N/A 02/25/2013   Procedure: Cystoscopy and laparoscopy with fulguration of uterine serosa;  Surgeon: Jamey Reas de Berton Lan, MD;  Location: [facility];  Service: Gynecology;  Laterality: N/A;  . TONSILLECTOMY      Family History  Problem Relation Age of Onset  . Diabetes Mother   . Stroke Father        deceased age 8  . Heart disease Sister        deceased MI age 35  . Heart disease Brother        deceased MI age 69  . Diabetes Maternal Grandmother   . Hypertension Paternal Grandmother   . Diabetes Sister   . Heart disease Sister   . Hypertension Sister   . Hyperlipidemia  Sister   . Diabetes Sister   . Heart disease Sister   . Hypertension Sister   . Hyperlipidemia Sister   . Diabetes Brother   . Heart disease Brother   . Hypertension Brother   . Hyperlipidemia Brother   . Colon cancer Neg Hx   . Esophageal cancer Neg Hx   . Stomach cancer Neg Hx   . Rectal cancer Neg Hx     Social History   Socioeconomic History  . Marital status: Widowed    Spouse name: Not on file  . Number of children: 0  . Years of education: college  . Highest education level: Not on file  Occupational History  . Occupation: retired  Scientific laboratory technician  . Financial resource strain: Not on file  . Food insecurity:    Worry: Not on file    Inability: Not on file  . Transportation needs:    Medical: Not on file    Non-medical: Not on file  Tobacco Use  . Smoking status: Never Smoker  . Smokeless tobacco: Never Used  . Tobacco comment: Never used tobacco  Substance and Sexual Activity  . Alcohol use: No    Alcohol/week: 0.0 standard drinks  . Drug use: No  . Sexual activity: Never    Partners: Male    Birth control/protection: Post-menopausal    Comment: lives alone, no dietary restrictions except avoid fresh veg, fruit, whole grains  Lifestyle  . Physical activity:    Days per week: Not on file    Minutes per session: Not on file  . Stress: Not on file  Relationships  . Social connections:    Talks on phone: Not on file    Gets together: Not on file    Attends religious service: Not on file    Active member of club or organization: Not on file    Attends meetings of clubs or organizations: Not on file    Relationship status: Not on file  . Intimate partner violence:    Fear of current or ex partner: Not on file    Emotionally abused: Not on file    Physically abused: Not on file    Forced sexual activity: Not on file  Other Topics Concern  . Not on file  Social History Narrative   Patient was married ([name]) - widow   Patient does not have any children.     Patient is right-handed.   Patient has a BA degree.   One caffeine drink daily     Outpatient Medications Prior to Visit  Medication Sig Dispense Refill  . ALPRAZolam (XANAX) 0.25 MG tablet Take 1 tablet (0.25 mg total) by mouth 2 (two)  times daily as needed for anxiety. 30 tablet 1  . amLODipine (NORVASC) 5 MG tablet TAKE 1 TABLET BY MOUTH  DAILY 90 tablet 3  . aspirin 81 MG tablet Take 81 mg by mouth daily.    . betamethasone dipropionate (DIPROLENE) 0.05 % cream Apply topically 2 (two) times daily. 30 g 0  . cholecalciferol (VITAMIN D) 1000 UNITS tablet Take 1,000 Units by mouth daily.      . ciprofloxacin (CIPRO) 500 MG tablet Take 1 tablet (500 mg total) by mouth 2 (two) times daily. 20 tablet 0  . dicyclomine (BENTYL) 10 MG capsule TAKE ONE CAPSULE BY MOUTH 4 TIMES DAILY AS NEEDED 120 capsule 1  . FOLBIC 2.5-25-2 MG TABS tablet TAKE 1 TABLET BY MOUTH EVERY DAY 90 tablet 1  . glucose blood (ONE TOUCH ULTRA TEST) test strip USE 2 TIMES DAILY FOR DIABETIC TESTING 700 each 2  . glucose monitoring kit (FREESTYLE) monitoring kit 1 each by Does not apply route as needed for other. 1 each 0  . metFORMIN (GLUCOPHAGE-XR) 500 MG 24 hr tablet Take 1 tablet (500 mg total) by mouth 3 (three) times daily with meals. 270 tablet 1  . metoCLOPramide (REGLAN) 5 MG tablet TAKE 1 TABLET BY MOUTH TWO  TIMES DAILY 180 tablet 1  . metoprolol tartrate (LOPRESSOR) 50 MG tablet TAKE 1 AND 1/2 TABLETS BY  MOUTH TWICE DAILY 270 tablet 0  . Multiple Vitamins-Calcium (VIACTIV MULTI-VITAMIN) CHEW Chew 2 each by mouth daily.    . ondansetron (ZOFRAN) 4 MG tablet Take 4 mg by mouth every 4 (four) hours as needed. For nausea      . PHARMACIST CHOICE LANCETS MISC USE 2 TIMES DAILY FOR DIABETIC TESTING 100 each 1  . potassium chloride (K-DUR,KLOR-CON) 10 MEQ tablet TAKE 1 TABLET BY MOUTH  DAILY 90 tablet 2  . rosuvastatin (CRESTOR) 10 MG tablet TAKE 1 TABLET BY MOUTH  DAILY 90 tablet 2  . esomeprazole (NEXIUM) 40 MG  capsule Take 1 capsule (40 mg total) by mouth daily before breakfast. 30 capsule 11  . losartan (COZAAR) 50 MG tablet Take 1 tablet (50 mg total) by mouth 2 (two) times daily. 180 tablet 0  . Multiple Vitamin (MULTIVITAMIN) tablet Take 1 tablet by mouth daily.       No facility-administered medications prior to visit.     Allergies  Allergen Reactions  . Tramadol Other (See Comments)    Dizziness     Review of Systems  Constitutional: Negative for fever and malaise/fatigue.  HENT: Positive for congestion and ear pain. Negative for hearing loss and tinnitus.   Eyes: Negative for blurred vision.  Respiratory: Negative for shortness of breath.   Cardiovascular: Negative for chest pain, palpitations and leg swelling.  Gastrointestinal: Negative for abdominal pain, blood in stool and nausea.  Genitourinary: Negative for dysuria and frequency.  Musculoskeletal: Positive for neck pain. Negative for falls and joint pain.  Skin: Negative for rash.  Neurological: Positive for focal weakness. Negative for dizziness, loss of consciousness and headaches.  Endo/Heme/Allergies: Negative for environmental allergies.  Psychiatric/Behavioral: Negative for depression. The patient is not nervous/anxious.        Objective:    Physical Exam  Constitutional: She is oriented to person, place, and time. She appears well-developed and well-nourished. No distress.  HENT:  Head: Normocephalic and atraumatic.  Nose: Nose normal.  Eyes: Right eye exhibits no discharge. Left eye exhibits no discharge.  Neck: Normal range of motion. Neck supple.  Spasm and  pain over left SCM muscle  Cardiovascular: Normal rate and regular rhythm.  No murmur heard. Pulmonary/Chest: Effort normal and breath sounds normal.  Abdominal: Soft. Bowel sounds are normal. She exhibits no mass. There is no tenderness.  Musculoskeletal: She exhibits no edema.  Neurological: She is alert and oriented to person, place, and time.    Skin: Skin is warm and dry.  Psychiatric: She has a normal mood and affect.  Nursing note and vitals reviewed.   BP 140/70 (BP Location: Left Arm, Patient Position: Sitting, Cuff Size: Normal)   Pulse 61   Temp (!) 97.5 F (36.4 C) (Oral)   Resp 18   Ht 5' (1.524 m)   Wt 173 lb 9.6 oz (78.7 kg)   LMP 02/07/1992   SpO2 98%   BMI 33.90 kg/m  Wt Readings from Last 3 Encounters:  10/11/17 173 lb 9.6 oz (78.7 kg)  08/07/17 174 lb 6.4 oz (79.1 kg)  08/03/17 173 lb 6 oz (78.6 kg)   BP Readings from Last 3 Encounters:  10/11/17 140/70  08/07/17 138/71  08/03/17 130/66     Immunization History  Administered Date(s) Administered  . Influenza Split 10/31/2010, 11/28/2011  . Influenza Whole 11/19/2007, 11/04/2008, 11/10/2009  . Influenza, High Dose Seasonal PF 11/11/2015, 11/21/2016  . Influenza,inj,Quad PF,6+ Mos 01/09/2013, 10/20/2013, 11/13/2014  . Pneumococcal Conjugate-13 09/11/2013  . Pneumococcal Polysaccharide-23 03/13/2007  . Td 03/02/2005    Health Maintenance  Topic Date Due  . TETANUS/TDAP  03/03/2015  . FOOT EXAM  11/13/2015  . OPHTHALMOLOGY EXAM  02/06/2017  . INFLUENZA VACCINE  09/06/2017  . HEMOGLOBIN A1C  04/06/2018  . DEXA SCAN  Completed  . PNA vac Low Risk Adult  Completed    Lab Results  Component Value Date   WBC 6.8 10/04/2017   HGB 12.7 10/04/2017   HCT 37.7 10/04/2017   PLT 216.0 10/04/2017   GLUCOSE 119 (H) 10/04/2017   CHOL 182 10/04/2017   TRIG 104.0 10/04/2017   HDL 67.80 10/04/2017   LDLDIRECT 123.7 04/13/2011   LDLCALC 93 10/04/2017   ALT 35 10/04/2017   AST 24 10/04/2017   NA 132 (L) 10/04/2017   K 4.1 10/04/2017   CL 97 10/04/2017   CREATININE 0.70 10/04/2017   BUN 19 10/04/2017   CO2 27 10/04/2017   TSH 2.28 10/04/2017   INR 0.88 01/16/2011   HGBA1C 6.5 10/04/2017   MICROALBUR 1.6 11/13/2014    Lab Results  Component Value Date   TSH 2.28 10/04/2017   Lab Results  Component Value Date   WBC 6.8 10/04/2017   HGB  12.7 10/04/2017   HCT 37.7 10/04/2017   MCV 85.3 10/04/2017   PLT 216.0 10/04/2017   Lab Results  Component Value Date   NA 132 (L) 10/04/2017   K 4.1 10/04/2017   CHLORIDE 101 07/08/2015   CO2 27 10/04/2017   GLUCOSE 119 (H) 10/04/2017   BUN 19 10/04/2017   CREATININE 0.70 10/04/2017   BILITOT 0.8 10/04/2017   ALKPHOS 59 10/04/2017   AST 24 10/04/2017   ALT 35 10/04/2017   PROT 7.0 10/04/2017   ALBUMIN 4.3 10/04/2017   CALCIUM 9.7 10/04/2017   ANIONGAP 10 05/14/2017   EGFR 75 (L) 07/08/2015   GFR 86.35 10/04/2017   Lab Results  Component Value Date   CHOL 182 10/04/2017   Lab Results  Component Value Date   HDL 67.80 10/04/2017   Lab Results  Component Value Date   LDLCALC 93 10/04/2017  Lab Results  Component Value Date   TRIG 104.0 10/04/2017   Lab Results  Component Value Date   CHOLHDL 3 10/04/2017   Lab Results  Component Value Date   HGBA1C 6.5 10/04/2017         Assessment & Plan:   Problem List Items Addressed This Visit    IBS (irritable bowel syndrome)    Encouraged increased hydration and fiber in diet. Daily probiotics. If bowels not moving can use MOM 2 tbls po in 4 oz of warm prune juice by mouth every 2-3 days. If no results then repeat in 4 hours with  Dulcolax suppository pr, may repeat again in 4 more hours as needed. Seek care if symptoms worsen. Consider daily Miralax and/or Dulcolax if symptoms persist.       Relevant Medications   esomeprazole (NEXIUM) 40 MG capsule   Vitamin D deficiency    Supplement and monitor      Neck pain - Primary    Left side with paesthesias at times. Apply moist heat and apply topical treatments.       Relevant Orders   DG Cervical Spine Complete   Sinusitis    Encouraged increased rest and hydration, add probiotics, zinc such as Coldeze or Xicam. Treat fevers as needed. Antibiotic and Mucinex and steroids      Relevant Medications   cefdinir (OMNICEF) 300 MG capsule   methylPREDNISolone  (MEDROL) 4 MG tablet      I have discontinued Camielle N. Estorga's multivitamin. I am also having her start on cefdinir and methylPREDNISolone. Additionally, I am having her maintain her cholecalciferol, ondansetron, aspirin, VIACTIV MULTI-VITAMIN, ALPRAZolam, potassium chloride, rosuvastatin, metoCLOPramide, FOLBIC, dicyclomine, ciprofloxacin, betamethasone dipropionate, glucose monitoring kit, metFORMIN, metoprolol tartrate, amLODipine, glucose blood, PHARMACIST CHOICE LANCETS, esomeprazole, and losartan.  Meds ordered this encounter  Medications  . esomeprazole (NEXIUM) 40 MG capsule    Sig: Take 1 capsule (40 mg total) by mouth daily before breakfast.    Dispense:  90 capsule    Refill:  6  . cefdinir (OMNICEF) 300 MG capsule    Sig: Take 1 capsule (300 mg total) by mouth 2 (two) times daily for 10 days.    Dispense:  20 capsule    Refill:  0  . methylPREDNISolone (MEDROL) 4 MG tablet    Sig: 5 tab po qd X 1d then 4 tab po qd X 1d then 3 tab po qd X 1d then 2 tab po qd then 1 tab po qd    Dispense:  15 tablet    Refill:  0  . losartan (COZAAR) 50 MG tablet    Sig: Take 1 tablet (50 mg total) by mouth 2 (two) times daily.    Dispense:  180 tablet    Refill:  1    CMA served as scribe during this visit. History, Physical and Plan performed by medical provider. Documentation and orders reviewed and attested to.  Penni Homans, MD

## 2017-10-11 NOTE — Assessment & Plan Note (Signed)
Supplement and monitor 

## 2017-10-11 NOTE — Assessment & Plan Note (Signed)
Encouraged increased rest and hydration, add probiotics, zinc such as Coldeze or Xicam. Treat fevers as needed. Antibiotic and Mucinex and steroids

## 2017-10-11 NOTE — Assessment & Plan Note (Signed)
Encouraged increased hydration and fiber in diet. Daily probiotics. If bowels not moving can use MOM 2 tbls po in 4 oz of warm prune juice by mouth every 2-3 days. If no results then repeat in 4 hours with  Dulcolax suppository pr, may repeat again in 4 more hours as needed. Seek care if symptoms worsen. Consider daily Miralax and/or Dulcolax if symptoms persist.  

## 2017-10-11 NOTE — Assessment & Plan Note (Signed)
Left side with paesthesias at times. Apply moist heat and apply topical treatments.

## 2017-10-13 ENCOUNTER — Other Ambulatory Visit: Payer: Self-pay | Admitting: Family Medicine

## 2017-11-14 ENCOUNTER — Inpatient Hospital Stay: Payer: Medicare Other | Attending: Hematology & Oncology | Admitting: Hematology & Oncology

## 2017-11-14 ENCOUNTER — Inpatient Hospital Stay: Payer: Medicare Other

## 2017-11-14 ENCOUNTER — Other Ambulatory Visit: Payer: Self-pay

## 2017-11-14 ENCOUNTER — Encounter: Payer: Self-pay | Admitting: Hematology & Oncology

## 2017-11-14 VITALS — BP 136/48 | HR 59 | Temp 98.0°F | Resp 20 | Wt 172.4 lb

## 2017-11-14 DIAGNOSIS — K909 Intestinal malabsorption, unspecified: Secondary | ICD-10-CM | POA: Diagnosis not present

## 2017-11-14 DIAGNOSIS — Z7982 Long term (current) use of aspirin: Secondary | ICD-10-CM

## 2017-11-14 DIAGNOSIS — E119 Type 2 diabetes mellitus without complications: Secondary | ICD-10-CM

## 2017-11-14 DIAGNOSIS — R5381 Other malaise: Secondary | ICD-10-CM | POA: Diagnosis not present

## 2017-11-14 DIAGNOSIS — D500 Iron deficiency anemia secondary to blood loss (chronic): Secondary | ICD-10-CM

## 2017-11-14 DIAGNOSIS — R1013 Epigastric pain: Secondary | ICD-10-CM | POA: Diagnosis not present

## 2017-11-14 DIAGNOSIS — E1169 Type 2 diabetes mellitus with other specified complication: Secondary | ICD-10-CM

## 2017-11-14 DIAGNOSIS — Z7984 Long term (current) use of oral hypoglycemic drugs: Secondary | ICD-10-CM | POA: Insufficient documentation

## 2017-11-14 DIAGNOSIS — R5383 Other fatigue: Secondary | ICD-10-CM

## 2017-11-14 DIAGNOSIS — E871 Hypo-osmolality and hyponatremia: Secondary | ICD-10-CM

## 2017-11-14 DIAGNOSIS — Z79899 Other long term (current) drug therapy: Secondary | ICD-10-CM | POA: Diagnosis not present

## 2017-11-14 DIAGNOSIS — E669 Obesity, unspecified: Secondary | ICD-10-CM

## 2017-11-14 DIAGNOSIS — D508 Other iron deficiency anemias: Secondary | ICD-10-CM | POA: Diagnosis not present

## 2017-11-14 LAB — CBC WITH DIFFERENTIAL (CANCER CENTER ONLY)
ABS IMMATURE GRANULOCYTES: 0.02 10*3/uL (ref 0.00–0.07)
BASOS ABS: 0.1 10*3/uL (ref 0.0–0.1)
BASOS PCT: 1 %
Eosinophils Absolute: 0.1 10*3/uL (ref 0.0–0.5)
Eosinophils Relative: 1 %
HEMATOCRIT: 36.3 % (ref 36.0–46.0)
Hemoglobin: 11.8 g/dL — ABNORMAL LOW (ref 12.0–15.0)
IMMATURE GRANULOCYTES: 0 %
LYMPHS ABS: 2.4 10*3/uL (ref 0.7–4.0)
Lymphocytes Relative: 34 %
MCH: 28.1 pg (ref 26.0–34.0)
MCHC: 32.5 g/dL (ref 30.0–36.0)
MCV: 86.4 fL (ref 80.0–100.0)
MONOS PCT: 9 %
Monocytes Absolute: 0.6 10*3/uL (ref 0.1–1.0)
NEUTROS ABS: 3.8 10*3/uL (ref 1.7–7.7)
NEUTROS PCT: 55 %
NRBC: 0 % (ref 0.0–0.2)
PLATELETS: 239 10*3/uL (ref 150–400)
RBC: 4.2 MIL/uL (ref 3.87–5.11)
RDW: 12.1 % (ref 11.5–15.5)
WBC Count: 7 10*3/uL (ref 4.0–10.5)

## 2017-11-14 LAB — CMP (CANCER CENTER ONLY)
ALBUMIN: 4 g/dL (ref 3.5–5.0)
ALT: 32 U/L (ref 0–44)
ANION GAP: 11 (ref 5–15)
AST: 27 U/L (ref 15–41)
Alkaline Phosphatase: 61 U/L (ref 38–126)
BILIRUBIN TOTAL: 0.7 mg/dL (ref 0.3–1.2)
BUN: 15 mg/dL (ref 8–23)
CHLORIDE: 100 mmol/L (ref 98–111)
CO2: 25 mmol/L (ref 22–32)
Calcium: 9.8 mg/dL (ref 8.9–10.3)
Creatinine: 0.79 mg/dL (ref 0.44–1.00)
GFR, Est AFR Am: >60 mL/min (ref >60)
Glucose, Bld: 133 mg/dL — ABNORMAL HIGH (ref 70–99)
POTASSIUM: 4.4 mmol/L (ref 3.5–5.1)
Sodium: 136 mmol/L (ref 135–145)
TOTAL PROTEIN: 7.4 g/dL (ref 6.5–8.1)

## 2017-11-14 LAB — RETICULOCYTES
IMMATURE RETIC FRACT: 3.5 % (ref 2.3–15.9)
RBC.: 4.2 MIL/uL (ref 3.87–5.11)
RETIC COUNT ABSOLUTE: 64.7 10*3/uL (ref 19.0–186.0)
RETIC CT PCT: 1.5 % (ref 0.4–3.1)

## 2017-11-14 NOTE — Progress Notes (Signed)
Hematology and Oncology Follow Up Visit  [name] [id number] [birth year] 77 y.o. 11/14/2017   Principle Diagnosis:   Iron deficiency anemia secondary to malabsorption  Current Therapy:    IV iron-patient received a dose back in October 2017     Interim History:  [name] is back for follow-up.  She is feeling tired.  She does not have as much energy.  So far, her iron has not been much of an issue.  She has other health issues.  She has diabetes.  We will see what her iron levels show.  She does have some epigastric discomfort.  She is not sure when she has to see the gastroenterologist.  She has a history of I think gastric carcinoid.  She has had no fever.  There is been no melena or bright red blood per rectum.    Overall, her performance status is ECOG 1.   Medications:  Current Outpatient Medications:  .  ALPRAZolam (XANAX) 0.25 MG tablet, Take 1 tablet (0.25 mg total) by mouth 2 (two) times daily as needed for anxiety., Disp: 30 tablet, Rfl: 1 .  amLODipine (NORVASC) 5 MG tablet, TAKE 1 TABLET BY MOUTH  DAILY, Disp: 90 tablet, Rfl: 3 .  aspirin 81 MG tablet, Take 81 mg by mouth daily., Disp: , Rfl:  .  cholecalciferol (VITAMIN D) 1000 UNITS tablet, Take 1,000 Units by mouth daily.  , Disp: , Rfl:  .  dicyclomine (BENTYL) 10 MG capsule, TAKE ONE CAPSULE BY MOUTH 4 TIMES DAILY AS NEEDED, Disp: 120 capsule, Rfl: 1 .  esomeprazole (NEXIUM) 40 MG capsule, Take 1 capsule (40 mg total) by mouth daily before breakfast., Disp: 90 capsule, Rfl: 6 .  FOLBIC 2.5-25-2 MG TABS tablet, TAKE 1 TABLET BY MOUTH EVERY DAY, Disp: 90 tablet, Rfl: 1 .  glucose blood (ONE TOUCH ULTRA TEST) test strip, USE 2 TIMES DAILY FOR DIABETIC TESTING, Disp: 700 each, Rfl: 2 .  glucose monitoring kit (FREESTYLE) monitoring kit, 1 each by Does not apply route as needed for other., Disp: 1 each, Rfl: 0 .  losartan (COZAAR) 50 MG tablet, Take 1 tablet (50 mg total) by mouth 2 (two) times daily.,  Disp: 180 tablet, Rfl: 1 .  metFORMIN (GLUCOPHAGE-XR) 500 MG 24 hr tablet, Take 1 tablet (500 mg total) by mouth 3 (three) times daily with meals., Disp: 270 tablet, Rfl: 1 .  metoCLOPramide (REGLAN) 5 MG tablet, TAKE 1 TABLET BY MOUTH TWO  TIMES DAILY, Disp: 180 tablet, Rfl: 1 .  metoprolol tartrate (LOPRESSOR) 50 MG tablet, TAKE 1 AND 1/2 TABLETS BY  MOUTH TWICE DAILY, Disp: 270 tablet, Rfl: 0 .  Multiple Vitamins-Calcium (VIACTIV MULTI-VITAMIN) CHEW, Chew 2 each by mouth daily., Disp: , Rfl:  .  ondansetron (ZOFRAN) 4 MG tablet, Take 4 mg by mouth every 4 (four) hours as needed. For nausea  , Disp: , Rfl:  .  PHARMACIST CHOICE LANCETS MISC, USE 2 TIMES DAILY FOR DIABETIC TESTING, Disp: 100 each, Rfl: 1 .  potassium chloride (K-DUR,KLOR-CON) 10 MEQ tablet, TAKE 1 TABLET BY MOUTH  DAILY, Disp: 90 tablet, Rfl: 2 .  rosuvastatin (CRESTOR) 10 MG tablet, TAKE 1 TABLET BY MOUTH  DAILY, Disp: 90 tablet, Rfl: 2 .  betamethasone dipropionate (DIPROLENE) 0.05 % cream, Apply topically 2 (two) times daily., Disp: 30 g, Rfl: 0 .  ciprofloxacin (CIPRO) 500 MG tablet, Take 1 tablet (500 mg total) by mouth 2 (two) times daily., Disp: 20 tablet, Rfl: 0 .  methylPREDNISolone (  MEDROL) 4 MG tablet, 5 tab po qd X 1d then 4 tab po qd X 1d then 3 tab po qd X 1d then 2 tab po qd then 1 tab po qd, Disp: 15 tablet, Rfl: 0  Allergies:  Allergies  Allergen Reactions  . Tramadol Other (See Comments)    Dizziness     Past Medical History, Surgical history, Social history, and Family History were reviewed and updated.  Review of Systems: Review of Systems  Constitutional: Positive for malaise/fatigue.  HENT: Negative.   Eyes: Negative.   Respiratory: Negative.   Cardiovascular: Negative.   Gastrointestinal: Negative.   Genitourinary: Negative.   Musculoskeletal: Negative.   Skin: Negative.   Neurological: Negative.   Endo/Heme/Allergies: Negative.   Psychiatric/Behavioral: Negative.      Physical Exam:   weight is 172 lb 6.4 oz (78.2 kg). Her oral temperature is 98 F (36.7 C). Her blood pressure is 136/48 (abnormal) and her pulse is 59 (abnormal). Her respiration is 20 and oxygen saturation is 100%.   Wt Readings from Last 3 Encounters:  11/14/17 172 lb 6.4 oz (78.2 kg)  10/11/17 173 lb 9.6 oz (78.7 kg)  08/07/17 174 lb 6.4 oz (79.1 kg)      Physical Exam  Constitutional: She is oriented to person, place, and time.  HENT:  Head: Normocephalic and atraumatic.  Mouth/Throat: Oropharynx is clear and moist.  Eyes: Pupils are equal, round, and reactive to light. EOM are normal.  Neck: Normal range of motion.  Cardiovascular: Normal rate, regular rhythm and normal heart sounds.  Pulmonary/Chest: Effort normal and breath sounds normal.  Abdominal: Soft. Bowel sounds are normal.  Musculoskeletal: Normal range of motion. She exhibits no edema, tenderness or deformity.  Lymphadenopathy:    She has no cervical adenopathy.  Neurological: She is alert and oriented to person, place, and time.  Skin: Skin is warm and dry. No rash noted. No erythema.  Psychiatric: She has a normal mood and affect. Her behavior is normal. Judgment and thought content normal.  Vitals reviewed.    Lab Results  Component Value Date   WBC 7.0 11/14/2017   HGB 11.8 (L) 11/14/2017   HCT 36.3 11/14/2017   MCV 86.4 11/14/2017   PLT 239 11/14/2017     Chemistry      Component Value Date/Time   NA 132 (L) 10/04/2017 0853   NA 136 05/08/2016 1305   NA 134 (L) 07/08/2015 1149   K 4.1 10/04/2017 0853   K 4.2 05/08/2016 1305   K 4.4 07/08/2015 1149   CL 97 10/04/2017 0853   CL 102 05/08/2016 1305   CO2 27 10/04/2017 0853   CO2 26 05/08/2016 1305   CO2 25 07/08/2015 1149   BUN 19 10/04/2017 0853   BUN 16 05/08/2016 1305   BUN 15.5 07/08/2015 1149   CREATININE 0.70 10/04/2017 0853   CREATININE 0.82 05/14/2017 1325   CREATININE 0.9 05/08/2016 1305   CREATININE 0.8 07/08/2015 1149      Component Value  Date/Time   CALCIUM 9.7 10/04/2017 0853   CALCIUM 9.4 05/08/2016 1305   CALCIUM 10.0 07/08/2015 1149   ALKPHOS 59 10/04/2017 0853   ALKPHOS 56 05/08/2016 1305   ALKPHOS 62 07/08/2015 1149   AST 24 10/04/2017 0853   AST 25 05/14/2017 1325   AST 24 07/08/2015 1149   ALT 35 10/04/2017 0853   ALT 28 05/14/2017 1325   ALT 29 05/08/2016 1305   ALT 30 07/08/2015 1149   BILITOT 0.8 10/04/2017  0853   BILITOT 0.7 05/14/2017 1325   BILITOT 0.64 07/08/2015 1149         Impression and Plan: Ms. Sison is 77 year old white female. She has iron deficiency. I suspect that this is secondary to malabsorption.   We will see what her iron levels are.  It is possible that her iron might be on the low side.  I will still plan to see her back in 6 weeks.  I want to make sure that everything is doing okay before the holiday season.      Volanda Napoleon, MD 10/9/20192:47 PM.

## 2017-11-15 ENCOUNTER — Telehealth: Payer: Self-pay | Admitting: *Deleted

## 2017-11-15 LAB — FERRITIN: FERRITIN: 208 ng/mL (ref 11–307)

## 2017-11-15 LAB — IRON AND TIBC
Iron: 58 ug/dL (ref 41–142)
Saturation Ratios: 19 % — ABNORMAL LOW (ref 21–57)
TIBC: 304 ug/dL (ref 236–444)
UIBC: 245 ug/dL

## 2017-11-15 LAB — TSH: TSH: 1.764 u[IU]/mL (ref 0.308–3.960)

## 2017-11-15 NOTE — Addendum Note (Signed)
Addended by: Burney Gauze R on: 11/15/2017 10:25 AM   Modules accepted: Orders

## 2017-11-15 NOTE — Telephone Encounter (Addendum)
Patient is aware of results. Appointment made  ----- Message from Valerie Napoleon, MD sent at 11/15/2017 10:23 AM EDT ----- Call - iron is low!!!  Needs 1 dose of feraheme!!  Please set up for next week!!  Laurey Arrow

## 2017-11-21 ENCOUNTER — Inpatient Hospital Stay: Payer: Medicare Other

## 2017-11-21 VITALS — BP 172/71 | HR 59 | Temp 98.6°F | Resp 16

## 2017-11-21 DIAGNOSIS — R1013 Epigastric pain: Secondary | ICD-10-CM | POA: Diagnosis not present

## 2017-11-21 DIAGNOSIS — D500 Iron deficiency anemia secondary to blood loss (chronic): Secondary | ICD-10-CM

## 2017-11-21 DIAGNOSIS — D508 Other iron deficiency anemias: Secondary | ICD-10-CM | POA: Diagnosis not present

## 2017-11-21 DIAGNOSIS — Z7982 Long term (current) use of aspirin: Secondary | ICD-10-CM | POA: Diagnosis not present

## 2017-11-21 DIAGNOSIS — K909 Intestinal malabsorption, unspecified: Secondary | ICD-10-CM

## 2017-11-21 DIAGNOSIS — E119 Type 2 diabetes mellitus without complications: Secondary | ICD-10-CM | POA: Diagnosis not present

## 2017-11-21 DIAGNOSIS — R5381 Other malaise: Secondary | ICD-10-CM | POA: Diagnosis not present

## 2017-11-21 DIAGNOSIS — R5383 Other fatigue: Secondary | ICD-10-CM | POA: Diagnosis not present

## 2017-11-21 DIAGNOSIS — Z79899 Other long term (current) drug therapy: Secondary | ICD-10-CM | POA: Diagnosis not present

## 2017-11-21 DIAGNOSIS — Z7984 Long term (current) use of oral hypoglycemic drugs: Secondary | ICD-10-CM | POA: Diagnosis not present

## 2017-11-21 MED ORDER — FAMOTIDINE 20 MG PO TABS: 40.0000 mg | ORAL_TABLET | Freq: Every day | ORAL | Status: DC

## 2017-11-21 MED ORDER — SODIUM CHLORIDE 0.9 % IV SOLN
40.0000 mg | Freq: Once | INTRAVENOUS | Status: AC
  Administered 2017-11-21: 40 mg via INTRAVENOUS
  Filled 2017-11-21: qty 4

## 2017-11-21 MED ORDER — FAMOTIDINE IN NACL 20-0.9 MG/50ML-% IV SOLN
INTRAVENOUS | Status: AC
  Filled 2017-11-21: qty 100

## 2017-11-21 MED ORDER — SODIUM CHLORIDE 0.9 % IV SOLN
Freq: Once | INTRAVENOUS | Status: AC
  Administered 2017-11-21: 13:00:00 via INTRAVENOUS
  Filled 2017-11-21: qty 250

## 2017-11-21 MED ORDER — SODIUM CHLORIDE 0.9 % IV SOLN
510.0000 mg | Freq: Once | INTRAVENOUS | Status: AC
  Administered 2017-11-21: 510 mg via INTRAVENOUS
  Filled 2017-11-21: qty 17

## 2017-11-21 MED FILL — ONE TOUCH DELICA 33G LANCET: 50 days supply | Qty: 100 | Fill #1

## 2017-11-21 MED FILL — ONE TOUCH ULTRA TEST STRIPS: 75 days supply | Qty: 150 | Fill #1

## 2017-11-21 NOTE — Patient Instructions (Signed)

## 2017-11-27 ENCOUNTER — Ambulatory Visit (INDEPENDENT_AMBULATORY_CARE_PROVIDER_SITE_OTHER): Payer: Medicare Other | Admitting: Family Medicine

## 2017-11-27 VITALS — BP 130/60 | HR 60 | Temp 98.0°F | Resp 18 | Wt 171.6 lb

## 2017-11-27 DIAGNOSIS — R51 Headache: Secondary | ICD-10-CM | POA: Diagnosis not present

## 2017-11-27 DIAGNOSIS — E782 Mixed hyperlipidemia: Secondary | ICD-10-CM

## 2017-11-27 DIAGNOSIS — R519 Headache, unspecified: Secondary | ICD-10-CM

## 2017-11-27 DIAGNOSIS — I10 Essential (primary) hypertension: Secondary | ICD-10-CM | POA: Diagnosis not present

## 2017-11-27 DIAGNOSIS — E1169 Type 2 diabetes mellitus with other specified complication: Secondary | ICD-10-CM | POA: Diagnosis not present

## 2017-11-27 DIAGNOSIS — E871 Hypo-osmolality and hyponatremia: Secondary | ICD-10-CM

## 2017-11-27 DIAGNOSIS — E559 Vitamin D deficiency, unspecified: Secondary | ICD-10-CM | POA: Diagnosis not present

## 2017-11-27 DIAGNOSIS — E669 Obesity, unspecified: Secondary | ICD-10-CM

## 2017-11-27 DIAGNOSIS — E7800 Pure hypercholesterolemia, unspecified: Secondary | ICD-10-CM

## 2017-11-27 MED ORDER — HYDRALAZINE HCL 10 MG PO TABS: 10.0000 mg | ORAL_TABLET | Freq: Two times a day (BID) | ORAL | 3 refills | Status: DC

## 2017-11-27 MED FILL — hydrALAZINE HCL 10 MG TABS: 10 | 30 days supply | Qty: 60 | Fill #0

## 2017-11-27 NOTE — Patient Instructions (Addendum)

## 2017-11-29 ENCOUNTER — Other Ambulatory Visit: Payer: Self-pay | Admitting: Family Medicine

## 2017-11-29 ENCOUNTER — Ambulatory Visit (HOSPITAL_BASED_OUTPATIENT_CLINIC_OR_DEPARTMENT_OTHER)
Admission: RE | Admit: 2017-11-29 | Discharge: 2017-11-29 | Disposition: A | Discharge status: Home / Self Care | Payer: Medicare Other | Source: Ambulatory Visit | Attending: Family Medicine | Admitting: Family Medicine

## 2017-11-29 DIAGNOSIS — R51 Headache: Secondary | ICD-10-CM | POA: Diagnosis not present

## 2017-11-29 DIAGNOSIS — R200 Anesthesia of skin: Secondary | ICD-10-CM | POA: Diagnosis not present

## 2017-11-29 DIAGNOSIS — Z1231 Encounter for screening mammogram for malignant neoplasm of breast: Secondary | ICD-10-CM

## 2017-11-29 DIAGNOSIS — G319 Degenerative disease of nervous system, unspecified: Secondary | ICD-10-CM | POA: Diagnosis not present

## 2017-11-29 DIAGNOSIS — R519 Headache, unspecified: Secondary | ICD-10-CM

## 2017-12-02 ENCOUNTER — Other Ambulatory Visit: Payer: Self-pay | Admitting: Cardiology

## 2017-12-02 ENCOUNTER — Other Ambulatory Visit: Payer: Self-pay | Admitting: Family Medicine

## 2017-12-02 NOTE — Assessment & Plan Note (Signed)
Encouraged heart healthy diet, increase exercise, avoid trans fats, consider a krill oil cap daily 

## 2017-12-02 NOTE — Assessment & Plan Note (Signed)
Resolved on recheck 

## 2017-12-02 NOTE — Assessment & Plan Note (Signed)
hgba1c acceptable, minimize simple carbs. Increase exercise as tolerated. Continue current meds 

## 2017-12-02 NOTE — Progress Notes (Signed)
Subjective:    [name], female    DOB: April 20, 1940, 77 y.o.   MRN: [id number]  No chief complaint on file.   HPI Patient is in today for follow up and she is noting with her antibiotics her congestion and facial pressure did improve but not fully. She is concerned about her persistence dizziness and headache.  Has some pressure in her face still no other neurologic complaints no recent falls or injury.  No fevers.  She is very anxious about the disequilibrium however. Denies CP/palp/SOB/congestion/fevers/GI or GU c/o. Taking meds as prescribed  Past Medical History:  Diagnosis Date  . Abdominal pain in female 03/18/2010   Qualifier: Diagnosis of  By: Carlean Purl MD, Dimas Millin Anemia 06/08/2014  . Anxiety   . Arthritis    Spinal Osteoarthritis  . Cancer ([place])   . Carcinoid tumor of stomach   . Cataract   . Chest pain    Myoview 12/15 no ischemia.  . Chronic kidney disease    Left kidney smaller than right kidney  . Constipation 11/21/2016  . Diabetes mellitus type 2 in obese ([place]) 09/05/2006   Qualifier: Diagnosis of  By: Marca Ancona RMA, Lucy    . Diabetic peripheral neuropathy ([place]) 10/29/2013  . Diverticulosis 08/30/2000   Colonoscopy   . Encounter for preventative adult health care exam with abnormal findings 09/14/2013  . Esophageal reflux   . Gastric polyp    Fundic Gland  . Gastroparesis   . Headache(784.0)   . Heart murmur    Echocardiogram 2/11: EF 60-65%, mild LAE, grade 1 diastolic dysfunction, aortic valve sclerosis, mean gradient 9 mm of mercury, PASP 34  . Hematuria 03/16/2016  . Iron deficiency anemia, unspecified   . 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 Leg swelling    bilateral  . Neck pain 04/22/2015  . PONV (postoperative nausea and vomiting)    pt states only needs small amount of anesthesia  . PSVT (paroxysmal supraventricular tachycardia) ([place])   . Pure hypercholesterolemia   . 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 Stroke ([place])    tia, 2014  . 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 Type II or unspecified type diabetes mellitus without mention of complication, not stated as uncontrolled   . Unspecified essential hypertension   . Unspecified hereditary and idiopathic peripheral neuropathy 10/29/2013    Past Surgical History:  Procedure Laterality Date  . CHOLECYSTECTOMY  1993  . COLONOSCOPY  11/11/2010   diverticulosis  . DILATATION & CURRETTAGE/HYSTEROSCOPY WITH RESECTOCOPE N/A 02/25/2013   Procedure: Attempted hysteroscopy with uterine perforation;  Surgeon: Jamey Reas de Berton Lan, MD;  Location: [facility];  Service: Gynecology;  Laterality: N/A;  . ESOPHAGOGASTRODUODENOSCOPY  08/29/2010; 09/15/2010   Carcinoid tumor less than 1 cm in July 2012 not seen in August 2012 , gastritis, fundic gland polyps  . ESOPHAGOGASTRODUODENOSCOPY  05/16/2011  . ESOPHAGOGASTRODUODENOSCOPY  06/14/2012  . EUS  12/15/2010   Procedure: UPPER ENDOSCOPIC ULTRASOUND (EUS) LINEAR;  Surgeon: Owens Loffler, MD;  Location: [facility];  Service: Endoscopy;  Laterality: N/A;  . EYE SURGERY Bilateral    Bi lateral cateracts and bi lateral laser  . LAPAROSCOPY N/A 02/25/2013   Procedure: Cystoscopy and laparoscopy with fulguration of uterine serosa;  Surgeon: Jamey Reas de Berton Lan, MD;  Location: [facility];  Service: Gynecology;  Laterality: N/A;  . TONSILLECTOMY      Family History  Problem Relation Age of Onset  . Diabetes Mother   .  Stroke Father        deceased age 71  . Heart disease Sister        deceased MI age 66  . Heart disease Brother        deceased MI age 66  . Diabetes Maternal Grandmother   . Hypertension Paternal Grandmother   . Diabetes Sister   . Heart disease Sister   . Hypertension Sister   . Hyperlipidemia Sister   . Diabetes Sister   . Heart disease Sister   . Hypertension Sister   . Hyperlipidemia Sister   . Diabetes Brother   . Heart disease Brother   . Hypertension Brother   . Hyperlipidemia Brother   . Colon cancer Neg  Hx   . Esophageal cancer Neg Hx   . Stomach cancer Neg Hx   . Rectal cancer Neg Hx     Social History   Socioeconomic History  . Marital status: Widowed    Spouse name: Not on file  . Number of children: 0  . Years of education: college  . Highest education level: Not on file  Occupational History  . Occupation: retired  Scientific laboratory technician  . Financial resource strain: Not on file  . Food insecurity:    Worry: Not on file    Inability: Not on file  . Transportation needs:    Medical: Not on file    Non-medical: Not on file  Tobacco Use  . Smoking status: Never Smoker  . Smokeless tobacco: Never Used  . Tobacco comment: Never used tobacco  Substance and Sexual Activity  . Alcohol use: No    Alcohol/week: 0.0 standard drinks  . Drug use: No  . Sexual activity: Never    Partners: Male    Birth control/protection: Post-menopausal    Comment: lives alone, no dietary restrictions except avoid fresh veg, fruit, whole grains  Lifestyle  . Physical activity:    Days per week: Not on file    Minutes per session: Not on file  . Stress: Not on file  Relationships  . Social connections:    Talks on phone: Not on file    Gets together: Not on file    Attends religious service: Not on file    Active member of club or organization: Not on file    Attends meetings of clubs or organizations: Not on file    Relationship status: Not on file  . Intimate partner violence:    Fear of current or ex partner: Not on file    Emotionally abused: Not on file    Physically abused: Not on file    Forced sexual activity: Not on file  Other Topics Concern  . Not on file  Social History Narrative   Patient was married ([name]) - widow   Patient does not have any children.   Patient is right-handed.   Patient has a BA degree.   One caffeine drink daily     Outpatient Medications Prior to Visit  Medication Sig Dispense Refill  . ALPRAZolam (XANAX) 0.25 MG tablet Take 1 tablet (0.25 mg total)  by mouth 2 (two) times daily as needed for anxiety. 30 tablet 1  . amLODipine (NORVASC) 5 MG tablet TAKE 1 TABLET BY MOUTH  DAILY 90 tablet 3  . aspirin 81 MG tablet Take 81 mg by mouth daily.    . cholecalciferol (VITAMIN D) 1000 UNITS tablet Take 1,000 Units by mouth daily.      . ciprofloxacin (CIPRO) 500 MG tablet  Take 1 tablet (500 mg total) by mouth 2 (two) times daily. 20 tablet 0  . dicyclomine (BENTYL) 10 MG capsule TAKE ONE CAPSULE BY MOUTH 4 TIMES DAILY AS NEEDED 120 capsule 1  . esomeprazole (NEXIUM) 40 MG capsule Take 1 capsule (40 mg total) by mouth daily before breakfast. 90 capsule 6  . FOLBIC 2.5-25-2 MG TABS tablet TAKE 1 TABLET BY MOUTH EVERY DAY 90 tablet 1  . glucose blood (ONE TOUCH ULTRA TEST) test strip USE 2 TIMES DAILY FOR DIABETIC TESTING 700 each 2  . glucose monitoring kit (FREESTYLE) monitoring kit 1 each by Does not apply route as needed for other. 1 each 0  . losartan (COZAAR) 50 MG tablet Take 1 tablet (50 mg total) by mouth 2 (two) times daily. 180 tablet 1  . metFORMIN (GLUCOPHAGE-XR) 500 MG 24 hr tablet Take 1 tablet (500 mg total) by mouth 3 (three) times daily with meals. 270 tablet 1  . metoCLOPramide (REGLAN) 5 MG tablet TAKE 1 TABLET BY MOUTH TWO  TIMES DAILY 180 tablet 1  . metoprolol tartrate (LOPRESSOR) 50 MG tablet TAKE 1 AND 1/2 TABLETS BY  MOUTH TWICE DAILY 270 tablet 0  . Multiple Vitamins-Calcium (VIACTIV MULTI-VITAMIN) CHEW Chew 2 each by mouth daily.    . ondansetron (ZOFRAN) 4 MG tablet Take 4 mg by mouth every 4 (four) hours as needed. For nausea      . PHARMACIST CHOICE LANCETS MISC USE 2 TIMES DAILY FOR DIABETIC TESTING 100 each 1  . potassium chloride (K-DUR,KLOR-CON) 10 MEQ tablet TAKE 1 TABLET BY MOUTH  DAILY 90 tablet 2  . rosuvastatin (CRESTOR) 10 MG tablet TAKE 1 TABLET BY MOUTH  DAILY 90 tablet 2  . betamethasone dipropionate (DIPROLENE) 0.05 % cream Apply topically 2 (two) times daily. 30 g 0  . methylPREDNISolone (MEDROL) 4 MG  tablet 5 tab po qd X 1d then 4 tab po qd X 1d then 3 tab po qd X 1d then 2 tab po qd then 1 tab po qd 15 tablet 0   No facility-administered medications prior to visit.     Allergies  Allergen Reactions  . Tramadol Other (See Comments)    Dizziness     Review of Systems  Constitutional: Positive for malaise/fatigue. Negative for fever.  HENT: Positive for sinus pain. Negative for congestion, ear pain, hearing loss and sore throat.   Eyes: Negative for blurred vision.  Respiratory: Negative for shortness of breath.   Cardiovascular: Negative for chest pain, palpitations and leg swelling.  Gastrointestinal: Negative for abdominal pain, blood in stool and nausea.  Genitourinary: Negative for dysuria and frequency.  Musculoskeletal: Negative for falls.  Skin: Negative for rash.  Neurological: Positive for dizziness and headaches. Negative for loss of consciousness.  Endo/Heme/Allergies: Negative for environmental allergies.  Psychiatric/Behavioral: Negative for depression. The patient is nervous/anxious.        Objective:    Physical Exam  Constitutional: She is oriented to person, place, and time. She appears well-developed and well-nourished. No distress.  HENT:  Head: Normocephalic and atraumatic.  Nose: Nose normal.  Eyes: Right eye exhibits no discharge. Left eye exhibits no discharge.  Neck: Normal range of motion. Neck supple.  Cardiovascular: Normal rate and regular rhythm.  No murmur heard. Pulmonary/Chest: Effort normal and breath sounds normal.  Abdominal: Soft. Bowel sounds are normal. There is no tenderness.  Musculoskeletal: She exhibits no edema.  Neurological: She is alert and oriented to person, place, and time.  Skin: Skin is  warm and dry.  Psychiatric: She has a normal mood and affect.  Nursing note and vitals reviewed.   BP 130/60 (BP Location: Left Arm, Patient Position: Sitting, Cuff Size: Normal)   Pulse 60   Temp 98 F (36.7 C) (Oral)   Resp 18    Wt 171 lb 9.6 oz (77.8 kg)   LMP 02/07/1992   SpO2 98%   BMI 33.51 kg/m  Wt Readings from Last 3 Encounters:  11/27/17 171 lb 9.6 oz (77.8 kg)  11/14/17 172 lb 6.4 oz (78.2 kg)  10/11/17 173 lb 9.6 oz (78.7 kg)     Lab Results  Component Value Date   WBC 7.0 11/14/2017   HGB 11.8 (L) 11/14/2017   HCT 36.3 11/14/2017   PLT 239 11/14/2017   GLUCOSE 133 (H) 11/14/2017   CHOL 182 10/04/2017   TRIG 104.0 10/04/2017   HDL 67.80 10/04/2017   LDLDIRECT 123.7 04/13/2011   LDLCALC 93 10/04/2017   ALT 32 11/14/2017   AST 27 11/14/2017   NA 136 11/14/2017   K 4.4 11/14/2017   CL 100 11/14/2017   CREATININE 0.79 11/14/2017   BUN 15 11/14/2017   CO2 25 11/14/2017   TSH 1.764 11/14/2017   INR 0.88 01/16/2011   HGBA1C 6.5 10/04/2017   MICROALBUR 1.6 11/13/2014    Lab Results  Component Value Date   TSH 1.764 11/14/2017   Lab Results  Component Value Date   WBC 7.0 11/14/2017   HGB 11.8 (L) 11/14/2017   HCT 36.3 11/14/2017   MCV 86.4 11/14/2017   PLT 239 11/14/2017   Lab Results  Component Value Date   NA 136 11/14/2017   K 4.4 11/14/2017   CHLORIDE 101 07/08/2015   CO2 25 11/14/2017   GLUCOSE 133 (H) 11/14/2017   BUN 15 11/14/2017   CREATININE 0.79 11/14/2017   BILITOT 0.7 11/14/2017   ALKPHOS 61 11/14/2017   AST 27 11/14/2017   ALT 32 11/14/2017   PROT 7.4 11/14/2017   ALBUMIN 4.0 11/14/2017   CALCIUM 9.8 11/14/2017   ANIONGAP 11 11/14/2017   EGFR 75 (L) 07/08/2015   GFR 86.35 10/04/2017   Lab Results  Component Value Date   CHOL 182 10/04/2017   Lab Results  Component Value Date   HDL 67.80 10/04/2017   Lab Results  Component Value Date   LDLCALC 93 10/04/2017   Lab Results  Component Value Date   TRIG 104.0 10/04/2017   Lab Results  Component Value Date   CHOLHDL 3 10/04/2017   Lab Results  Component Value Date   HGBA1C 6.5 10/04/2017       Assessment & Plan:   Problem List Items Addressed This Visit    Diabetes mellitus type 2  in obese ([place]) - Primary (Chronic)    hgba1c acceptable, minimize simple carbs. Increase exercise as tolerated. Continue current meds      Relevant Orders   CBC   Comprehensive metabolic panel   TSH   Hemoglobin A1c   Essential hypertension (Chronic)    Well controlled, no changes to meds. Encouraged heart healthy diet such as the DASH diet and exercise as tolerated.       Relevant Medications   hydrALAZINE (APRESOLINE) 10 MG tablet   HYPERCHOLESTEROLEMIA    Encouraged heart healthy diet, increase exercise, avoid trans fats, consider a krill oil cap daily      Relevant Medications   hydrALAZINE (APRESOLINE) 10 MG tablet   Vitamin D deficiency    Supplement and  monitor      Relevant Orders   VITAMIN D 25 Hydroxy (Vit-D Deficiency, Fractures)   Hyponatremia    Resolved on recheck.       Headache    With intermittent dizziness and discomfort in face. CT scan of head is negative for any acute concerns      Relevant Orders   CT Head Wo Contrast (Completed)    Other Visit Diagnoses    Mixed hyperlipidemia       Relevant Medications   hydrALAZINE (APRESOLINE) 10 MG tablet   Other Relevant Orders   Lipid panel      I have discontinued Jacaria N. Canino's betamethasone dipropionate and methylPREDNISolone. I am also having her start on hydrALAZINE. Additionally, I am having her maintain her cholecalciferol, ondansetron, aspirin, VIACTIV MULTI-VITAMIN, ALPRAZolam, potassium chloride, rosuvastatin, metoCLOPramide, dicyclomine, ciprofloxacin, glucose monitoring kit, metFORMIN, metoprolol tartrate, amLODipine, glucose blood, PHARMACIST CHOICE LANCETS, esomeprazole, losartan, and FOLBIC.  Meds ordered this encounter  Medications  . hydrALAZINE (APRESOLINE) 10 MG tablet    Sig: Take 1 tablet (10 mg total) by mouth 2 (two) times daily.    Dispense:  60 tablet    Refill:  3     Penni Homans, MD

## 2017-12-02 NOTE — Assessment & Plan Note (Signed)
Supplement and monitor 

## 2017-12-02 NOTE — Assessment & Plan Note (Signed)
With intermittent dizziness and discomfort in face. CT scan of head is negative for any acute concerns

## 2017-12-02 NOTE — Assessment & Plan Note (Signed)
Well controlled, no changes to meds. Encouraged heart healthy diet such as the DASH diet and exercise as tolerated.  °

## 2017-12-07 ENCOUNTER — Encounter: Payer: Self-pay | Admitting: Family Medicine

## 2017-12-07 ENCOUNTER — Ambulatory Visit (INDEPENDENT_AMBULATORY_CARE_PROVIDER_SITE_OTHER): Payer: Medicare Other | Admitting: Family Medicine

## 2017-12-07 VITALS — BP 138/70 | HR 64 | Temp 98.2°F | Resp 16 | Ht 60.0 in | Wt 172.6 lb

## 2017-12-07 DIAGNOSIS — B370 Candidal stomatitis: Secondary | ICD-10-CM | POA: Diagnosis not present

## 2017-12-07 DIAGNOSIS — N760 Acute vaginitis: Secondary | ICD-10-CM | POA: Diagnosis not present

## 2017-12-07 MED ORDER — FLUCONAZOLE 150 MG PO TABS: ORAL_TABLET | ORAL | 0 refills | Status: DC

## 2017-12-07 MED FILL — FLUCONAZOLE 150 MG TABS: 150 | 4 days supply | Qty: 2 | Fill #0

## 2017-12-07 NOTE — Progress Notes (Signed)
[name], female    DOB: 1941-01-09  Age: 77 y.o. MRN: [id number]    Subjective:  Subjective  HPI [name] presents for possible thrush after taking pred and abx a few weeks ago.   She also has vaginal itching--- no d/c .  Her dentist gave her a mouthwash--- she does not know what it was .    It does seem to be helping  Review of Systems  Constitutional: Negative for appetite change, diaphoresis, fatigue and unexpected weight change.  HENT: Positive for mouth sores and sore throat.   Eyes: Negative for pain, redness and visual disturbance.  Respiratory: Negative for cough, chest tightness, shortness of breath and wheezing.   Cardiovascular: Negative for chest pain, palpitations and leg swelling.  Endocrine: Negative for cold intolerance, heat intolerance, polydipsia, polyphagia and polyuria.  Genitourinary: Negative for difficulty urinating, dysuria and frequency.  Neurological: Negative for dizziness, light-headedness, numbness and headaches.    History Past Medical History:  Diagnosis Date  . Abdominal pain in female 03/18/2010   Qualifier: Diagnosis of  By: Carlean Purl MD, Dimas Millin Anemia 06/08/2014  . Anxiety   . Arthritis    Spinal Osteoarthritis  . Cancer ([place])   . Carcinoid tumor of stomach   . Cataract   . Chest pain    Myoview 12/15 no ischemia.  . Chronic kidney disease    Left kidney smaller than right kidney  . Constipation 11/21/2016  . Diabetes mellitus type 2 in obese ([place]) 09/05/2006   Qualifier: Diagnosis of  By: Marca Ancona RMA, Lucy    . Diabetic peripheral neuropathy ([place]) 10/29/2013  . Diverticulosis 08/30/2000   Colonoscopy   . Encounter for preventative adult health care exam with abnormal findings 09/14/2013  . Esophageal reflux   . Gastric polyp    Fundic Gland  . Gastroparesis   . Headache(784.0)   . Heart murmur    Echocardiogram 2/11: EF 60-65%, mild LAE, grade 1 diastolic dysfunction, aortic valve sclerosis, mean  gradient 9 mm of mercury, PASP 34  . Hematuria 03/16/2016  . Iron deficiency anemia, unspecified   . 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 Leg swelling    bilateral  . Neck pain 04/22/2015  . PONV (postoperative nausea and vomiting)    pt states only needs small amount of anesthesia  . PSVT (paroxysmal supraventricular tachycardia) ([place])   . Pure hypercholesterolemia   . 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 Stroke ([place])    tia, 2014  . 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 Type II or unspecified type diabetes mellitus without mention of complication, not stated as uncontrolled   . Unspecified essential hypertension   . Unspecified hereditary and idiopathic peripheral neuropathy 10/29/2013    She has a past surgical history that includes Cholecystectomy (1993); Tonsillectomy; Colonoscopy (11/11/2010); Esophagogastroduodenoscopy (08/29/2010; 09/15/2010); EUS (12/15/2010); Esophagogastroduodenoscopy (05/16/2011); Esophagogastroduodenoscopy (06/14/2012); Dilatation & currettage/hysteroscopy with resectoscope (N/A, 02/25/2013); laparoscopy (N/A, 02/25/2013); and Eye surgery (Bilateral).   Her family history includes Diabetes in her brother, maternal grandmother, mother, sister, and sister; Heart disease in her brother, brother, sister, sister, and sister; Hyperlipidemia in her brother, sister, and sister; Hypertension in her brother, paternal grandmother, sister, and sister; Stroke in her father.She reports that she has never smoked. She has never used smokeless tobacco. She reports that she does not drink alcohol or use drugs.  Current Outpatient Medications on File Prior to Visit  Medication Sig Dispense Refill  . ALPRAZolam (XANAX) 0.25 MG tablet Take 1 tablet (0.25  mg total) by mouth 2 (two) times daily as needed for anxiety. 30 tablet 1  . amLODipine (NORVASC) 5 MG tablet TAKE 1 TABLET BY MOUTH  DAILY 90 tablet 3  . aspirin 81 MG tablet Take 81 mg by mouth daily.    . cholecalciferol (VITAMIN D) 1000 UNITS tablet Take 1,000 Units  by mouth daily.      . ciprofloxacin (CIPRO) 500 MG tablet Take 1 tablet (500 mg total) by mouth 2 (two) times daily. 20 tablet 0  . dicyclomine (BENTYL) 10 MG capsule TAKE ONE CAPSULE BY MOUTH 4 TIMES DAILY AS NEEDED 120 capsule 1  . esomeprazole (NEXIUM) 40 MG capsule Take 1 capsule (40 mg total) by mouth daily before breakfast. 90 capsule 6  . FOLBIC 2.5-25-2 MG TABS tablet TAKE 1 TABLET BY MOUTH EVERY DAY 90 tablet 1  . glucose blood (ONE TOUCH ULTRA TEST) test strip USE 2 TIMES DAILY FOR DIABETIC TESTING 700 each 2  . glucose monitoring kit (FREESTYLE) monitoring kit 1 each by Does not apply route as needed for other. 1 each 0  . hydrALAZINE (APRESOLINE) 10 MG tablet Take 1 tablet (10 mg total) by mouth 2 (two) times daily. 60 tablet 3  . losartan (COZAAR) 50 MG tablet Take 1 tablet (50 mg total) by mouth 2 (two) times daily. 180 tablet 1  . metFORMIN (GLUCOPHAGE-XR) 500 MG 24 hr tablet Take 1 tablet (500 mg total) by mouth 3 (three) times daily with meals. 270 tablet 1  . metoCLOPramide (REGLAN) 5 MG tablet TAKE 1 TABLET BY MOUTH TWO  TIMES DAILY 180 tablet 1  . metoprolol tartrate (LOPRESSOR) 50 MG tablet TAKE 1 AND 1/2 TABLETS BY  MOUTH TWICE DAILY 270 tablet 1  . Multiple Vitamins-Calcium (VIACTIV MULTI-VITAMIN) CHEW Chew 2 each by mouth daily.    . ondansetron (ZOFRAN) 4 MG tablet Take 4 mg by mouth every 4 (four) hours as needed. For nausea      . PHARMACIST CHOICE LANCETS MISC USE 2 TIMES DAILY FOR DIABETIC TESTING 100 each 1  . potassium chloride (K-DUR,KLOR-CON) 10 MEQ tablet TAKE 1 TABLET BY MOUTH  DAILY 90 tablet 2  . rosuvastatin (CRESTOR) 10 MG tablet TAKE 1 TABLET BY MOUTH  DAILY 90 tablet 2   No current facility-administered medications on file prior to visit.      Objective:  Objective  Physical Exam  HENT:  Mouth/Throat: Oral lesions present. Posterior oropharyngeal erythema present.    Nursing note and vitals reviewed.  BP 138/70   Pulse 64   Temp 98.2 F (36.8  C) (Oral)   Resp 16   Ht 5' (1.524 m)   Wt 172 lb 9.6 oz (78.3 kg)   LMP 02/07/1992   SpO2 97%   BMI 33.71 kg/m  Wt Readings from Last 3 Encounters:  12/07/17 172 lb 9.6 oz (78.3 kg)  11/27/17 171 lb 9.6 oz (77.8 kg)  11/14/17 172 lb 6.4 oz (78.2 kg)     Lab Results  Component Value Date   WBC 7.0 11/14/2017   HGB 11.8 (L) 11/14/2017   HCT 36.3 11/14/2017   PLT 239 11/14/2017   GLUCOSE 133 (H) 11/14/2017   CHOL 182 10/04/2017   TRIG 104.0 10/04/2017   HDL 67.80 10/04/2017   LDLDIRECT 123.7 04/13/2011   LDLCALC 93 10/04/2017   ALT 32 11/14/2017   AST 27 11/14/2017   NA 136 11/14/2017   K 4.4 11/14/2017   CL 100 11/14/2017   CREATININE 0.79 11/14/2017  BUN 15 11/14/2017   CO2 25 11/14/2017   TSH 1.764 11/14/2017   INR 0.88 01/16/2011   HGBA1C 6.5 10/04/2017   MICROALBUR 1.6 11/13/2014    Ct Head Wo Contrast  Result Date: 11/30/2017 CLINICAL DATA:  Left-sided facial and head numbness EXAM: CT HEAD WITHOUT CONTRAST TECHNIQUE: Contiguous axial images were obtained from the base of the skull through the vertex without intravenous contrast. COMPARISON:  11/12/2013 FINDINGS: Brain: No evidence of acute infarction, hemorrhage, extra-axial collection, ventriculomegaly, or mass effect. Prominence of CSF along the superior convexity similar in appearance to the prior exams. Generalized cerebral atrophy. Vascular: Cerebrovascular atherosclerotic calcifications are noted. Skull: Negative for fracture or focal lesion. Sinuses/Orbits: Visualized portions of the orbits are unremarkable. Visualized portions of the paranasal sinuses are unremarkable. Visualized portions of the mastoid air cells are unremarkable. Other: None. IMPRESSION: 1. No acute intracranial pathology. 2. Generalized cerebral atrophy. Electronically Signed   By: Kathreen Devoid   On: 11/30/2017 08:24     Assessment & Plan:  Plan  I am having [name] start on fluconazole. I am also having her maintain her  cholecalciferol, ondansetron, aspirin, VIACTIV MULTI-VITAMIN, ALPRAZolam, metoCLOPramide, dicyclomine, ciprofloxacin, glucose monitoring kit, metFORMIN, amLODipine, glucose blood, PHARMACIST CHOICE LANCETS, esomeprazole, losartan, FOLBIC, hydrALAZINE, rosuvastatin, metoprolol tartrate, and potassium chloride.  Meds ordered this encounter  Medications  . fluconazole (DIFLUCAN) 150 MG tablet    Sig: 1 po x1, may repeat in 3 days prn    Dispense:  2 tablet    Refill:  0    Problem List Items Addressed This Visit      Unprioritized   Oral thrush - Primary    Called [facility] -- pt was given magic mouthwash con't to use Diflucan sent as well rto prn       Relevant Medications   fluconazole (DIFLUCAN) 150 MG tablet    Other Visit Diagnoses    Acute vaginitis       Relevant Medications   fluconazole (DIFLUCAN) 150 MG tablet      Follow-up: Return if symptoms worsen or fail to improve.  Ann Held, DO

## 2017-12-07 NOTE — Assessment & Plan Note (Signed)
Called [facility] -- pt was given magic mouthwash con't to use Diflucan sent as well rto prn

## 2017-12-07 NOTE — Patient Instructions (Signed)
Oral Thrush, Adult Oral thrush, also called oral candidiasis, is a fungal infection that develops in the mouth and throat and on the tongue. It causes white patches to form on the mouth and tongue. Thrush is most common in older adults, but it can occur at any age. Many cases of thrush are mild, but this infection can also be serious. Thrush can be a repeated (recurrent) problem for certain people who have a weak body defense system (immune system). The weakness can be caused by chronic illnesses, or by taking medicines that limit the body's ability to fight infection. If a person has difficulty fighting infection, the fungus that causes thrush can spread through the body. This can cause life-threatening blood or organ infections. What are the causes? This condition is caused by a fungus (yeast) called Candida albicans.  This fungus is normally present in small amounts in the mouth and on other mucous membranes. It usually causes no harm.  If conditions are present that allow the fungus to grow without control, it invades surrounding tissues and becomes an infection.  Other Candida species can also lead to thrush (rare).  What increases the risk? This condition is more likely to develop in:  People with a weakened immune system.  Older adults.  People with HIV (human immunodeficiency virus).  People with diabetes.  People with dry mouth (xerostomia).  Pregnant women.  People with poor dental care, especially people who have false teeth.  People who use antibiotic medicines.  What are the signs or symptoms? Symptoms of this condition can vary from mild and moderate to severe and persistent. Symptoms may include:  A burning feeling in the mouth and throat. This can occur at the start of a thrush infection.  White patches that stick to the mouth and tongue. The tissue around the patches may be red, raw, and painful. If rubbed (during tooth brushing, for example), the patches and the  tissue of the mouth may bleed easily.  A bad taste in the mouth or difficulty tasting foods.  A cottony feeling in the mouth.  Pain during eating and swallowing.  Poor appetite.  Cracking at the corners of the mouth.  How is this diagnosed? This condition is diagnosed based on:  Physical exam. Your health care provider will look in your mouth.  Health history. Your health care provider will ask you questions about your health.  How is this treated? This condition is treated with medicines called antifungals, which prevent the growth of fungi. These medicines are either applied directly to the affected area (topical) or swallowed (oral). The treatment will depend on the severity of the condition. Mild thrush Mild cases of thrush may clear up with the use of an antifungal mouth rinse or lozenges. Treatment usually lasts about 14 days. Moderate to severe thrush  More severe thrush infections that have spread to the esophagus are treated with an oral antifungal medicine. A topical antifungal medicine may also be used.  For some severe infections, treatment may need to continue for more than 14 days.  Oral antifungal medicines are rarely used during pregnancy because they may be harmful to the unborn child. If you are pregnant, talk with your health care provider about options for treatment. Persistent or recurrent thrush For cases of thrush that do not go away or keep coming back:  Treatment may be needed twice as long as the symptoms last.  Treatment will include both oral and topical antifungal medicines.  People with a weakened immune   system can take an antifungal medicine on a continuous basis to prevent thrush infections.  It is important to treat conditions that make a person more likely to get thrush, such as diabetes or HIV. Follow these instructions at home: Medicines  Take over-the-counter and prescription medicines only as told by your health care provider.  Talk  with your health care provider about an over-the-counter medicine called gentian violet, which kills bacteria and fungi. Relieving soreness and discomfort To help reduce the discomfort of thrush:  Drink cold liquids such as water or iced tea.  Try flavored ice treats or frozen juices.  Eat foods that are easy to swallow, such as gelatin, ice cream, or custard.  Try drinking from a straw if the patches in your mouth are painful.  General instructions  Eat plain, unflavored yogurt as directed by your health care provider. Check the label to make sure the yogurt contains live cultures. This yogurt can help healthy bacteria to grow in the mouth and can stop the growth of the fungus that causes thrush.  If you wear dentures, remove the dentures before going to bed, brush them vigorously, and soak them in a cleaning solution as directed by your health care provider.  Rinse your mouth with a warm salt-water mixture several times a day. To make a salt-water mixture, completely dissolve 1/2-1 tsp of salt in 1 cup of warm water. Contact a health care provider if:  Your symptoms are getting worse or are not improving within 7 days of starting treatment.  You have symptoms of a spreading infection, such as white patches on the skin outside of the mouth. This information is not intended to replace advice given to you by your health care provider. Make sure you discuss any questions you have with your health care provider. Document Released: 10/19/2003 Document Revised: 10/18/2015 Document Reviewed: 10/18/2015 Elsevier Interactive Patient Education  2017 Elsevier Inc.  

## 2017-12-10 ENCOUNTER — Telehealth: Payer: Self-pay | Admitting: Family Medicine

## 2017-12-10 NOTE — Progress Notes (Signed)
HPI The patient presents for followup of SVT.   Since I last saw her since I last saw her she had no new complaints from a cardiac standpoint.  She had one episode probably a couple of months ago.  She has chronic fatigue.  She had an ear infection with thrush which she says interferes with her exercising.  She does not do any exercising but does some activities of daily living.  She is lonely.   Allergies  Allergen Reactions  . Tramadol Other (See Comments)    Dizziness     Current Outpatient Medications  Medication Sig Dispense Refill  . ALPRAZolam (XANAX) 0.25 MG tablet Take 1 tablet (0.25 mg total) by mouth 2 (two) times daily as needed for anxiety. 30 tablet 1  . amLODipine (NORVASC) 5 MG tablet TAKE 1 TABLET BY MOUTH  DAILY 90 tablet 3  . aspirin 81 MG tablet Take 81 mg by mouth daily.    . cholecalciferol (VITAMIN D) 1000 UNITS tablet Take 1,000 Units by mouth daily.      . ciprofloxacin (CIPRO) 500 MG tablet Take 1 tablet (500 mg total) by mouth 2 (two) times daily. 20 tablet 0  . dicyclomine (BENTYL) 10 MG capsule TAKE ONE CAPSULE BY MOUTH 4 TIMES DAILY AS NEEDED 120 capsule 1  . esomeprazole (NEXIUM) 40 MG capsule Take 1 capsule (40 mg total) by mouth daily before breakfast. 90 capsule 6  . fluconazole (DIFLUCAN) 150 MG tablet 1 po x1, may repeat in 3 days prn 2 tablet 0  . FOLBIC 2.5-25-2 MG TABS tablet TAKE 1 TABLET BY MOUTH EVERY DAY 90 tablet 1  . glucose blood (ONE TOUCH ULTRA TEST) test strip USE 2 TIMES DAILY FOR DIABETIC TESTING 700 each 2  . glucose monitoring kit (FREESTYLE) monitoring kit 1 each by Does not apply route as needed for other. 1 each 0  . hydrALAZINE (APRESOLINE) 10 MG tablet Take 1 tablet (10 mg total) by mouth 2 (two) times daily. 60 tablet 3  . losartan (COZAAR) 50 MG tablet Take 1 tablet (50 mg total) by mouth 2 (two) times daily. 180 tablet 1  . metFORMIN (GLUCOPHAGE-XR) 500 MG 24 hr tablet Take 1 tablet (500 mg total) by mouth 3 (three) times  daily with meals. 270 tablet 1  . metoCLOPramide (REGLAN) 5 MG tablet TAKE 1 TABLET BY MOUTH TWO  TIMES DAILY 180 tablet 1  . metoprolol tartrate (LOPRESSOR) 50 MG tablet TAKE 1 AND 1/2 TABLETS BY  MOUTH TWICE DAILY 270 tablet 1  . Multiple Vitamins-Calcium (VIACTIV MULTI-VITAMIN) CHEW Chew 2 each by mouth daily.    . ondansetron (ZOFRAN) 4 MG tablet Take 4 mg by mouth every 4 (four) hours as needed. For nausea      . PHARMACIST CHOICE LANCETS MISC USE 2 TIMES DAILY FOR DIABETIC TESTING 100 each 1  . potassium chloride (K-DUR,KLOR-CON) 10 MEQ tablet TAKE 1 TABLET BY MOUTH  DAILY 90 tablet 2  . rosuvastatin (CRESTOR) 10 MG tablet TAKE 1 TABLET BY MOUTH  DAILY 90 tablet 2   No current facility-administered medications for this visit.     Past Medical History:  Diagnosis Date  . Abdominal pain in female 03/18/2010   Qualifier: Diagnosis of  By: Carlean Purl MD, Dimas Millin Anemia 06/08/2014  . Anxiety   . Arthritis    Spinal Osteoarthritis  . Cancer ([place])   . Carcinoid tumor of stomach   . Cataract   . Chest  pain    Myoview 12/15 no ischemia.  . Chronic kidney disease    Left kidney smaller than right kidney  . Constipation 11/21/2016  . Diabetes mellitus type 2 in obese ([place]) 09/05/2006   Qualifier: Diagnosis of  By: Marca Ancona RMA, Lucy    . Diabetic peripheral neuropathy ([place]) 10/29/2013  . Diverticulosis 08/30/2000   Colonoscopy   . Encounter for preventative adult health care exam with abnormal findings 09/14/2013  . Esophageal reflux   . Gastric polyp    Fundic Gland  . Gastroparesis   . Headache(784.0)   . Heart murmur    Echocardiogram 2/11: EF 60-65%, mild LAE, grade 1 diastolic dysfunction, aortic valve sclerosis, mean gradient 9 mm of mercury, PASP 34  . Hematuria 03/16/2016  . Iron deficiency anemia, unspecified   . 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 Leg swelling    bilateral  . Neck pain 04/22/2015  . PONV (postoperative nausea and vomiting)    pt states only needs small amount  of anesthesia  . PSVT (paroxysmal supraventricular tachycardia) ([place])   . Pure hypercholesterolemia   . 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 Stroke ([place])    tia, 2014  . 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 Type II or unspecified type diabetes mellitus without mention of complication, not stated as uncontrolled   . Unspecified essential hypertension   . Unspecified hereditary and idiopathic peripheral neuropathy 10/29/2013    Past Surgical History:  Procedure Laterality Date  . CHOLECYSTECTOMY  1993  . COLONOSCOPY  11/11/2010   diverticulosis  . DILATATION & CURRETTAGE/HYSTEROSCOPY WITH RESECTOCOPE N/A 02/25/2013   Procedure: Attempted hysteroscopy with uterine perforation;  Surgeon: Jamey Reas de Berton Lan, MD;  Location: [facility];  Service: Gynecology;  Laterality: N/A;  . ESOPHAGOGASTRODUODENOSCOPY  08/29/2010; 09/15/2010   Carcinoid tumor less than 1 cm in July 2012 not seen in August 2012 , gastritis, fundic gland polyps  . ESOPHAGOGASTRODUODENOSCOPY  05/16/2011  . ESOPHAGOGASTRODUODENOSCOPY  06/14/2012  . EUS  12/15/2010   Procedure: UPPER ENDOSCOPIC ULTRASOUND (EUS) LINEAR;  Surgeon: Owens Loffler, MD;  Location: [facility];  Service: Endoscopy;  Laterality: N/A;  . EYE SURGERY Bilateral    Bi lateral cateracts and bi lateral laser  . LAPAROSCOPY N/A 02/25/2013   Procedure: Cystoscopy and laparoscopy with fulguration of uterine serosa;  Surgeon: Jamey Reas de Berton Lan, MD;  Location: [facility];  Service: Gynecology;  Laterality: N/A;  . TONSILLECTOMY      ROS:   As stated in the HPI and negative for all other systems.   PHYSICAL EXAM BP 138/86   Pulse 66   Ht 5' (1.524 m)   Wt 172 lb 3.2 oz (78.1 kg)   LMP 02/07/1992   SpO2 97%   BMI 33.63 kg/m   GENERAL:  Well appearing NECK:  No jugular venous distention, waveform within normal limits, carotid upstroke brisk and symmetric, no bruits, no thyromegaly LUNGS:  Clear to auscultation bilaterally CHEST:  Unremarkable HEART:   PMI not displaced or sustained,S1 and S2 within normal limits, no S3, no S4, no clicks, no rubs, 2 out of 6 apical soft systolic non-radiating short murmur, no diastolic murmurs ABD:  Flat, positive bowel sounds normal in frequency in pitch, no bruits, no rebound, no guarding, no midline pulsatile mass, no hepatomegaly, no splenomegaly EXT:  2 plus pulses throughout, no edema, no cyanosis no clubbing   EKG:  NA  Lab Results  Component Value Date   CHOL 182 10/04/2017   TRIG  104.0 10/04/2017   HDL 67.80 10/04/2017   LDLCALC 93 10/04/2017   LDLDIRECT 123.7 04/13/2011     Lab Results  Component Value Date   HGBA1C 6.5 10/04/2017     ASSESSMENT AND PLAN  Supraventricular Tachycardia - She is had no symptomatic recurrence of this recently.  No change in therapy.   Hypertension - Her blood pressure is mildly elevated but somewhat fluctuating.  She will keep a 2-week blood pressure diary and I will consider making other changes.  Dyslipidemia -  LDL was 93.  No change in therapy.  Carotid stenosis - She had mild plaque earlier this year.  No further imaging.  Murmur - This is mild aortic stenosis. I will follow this clinically.    Aortic atherosclerosis - She will continue with risk reduction.

## 2017-12-10 NOTE — Telephone Encounter (Signed)
Copied from [place] [id number]. Topic: General - Other >> Dec 10, 2017 11:04 AM Lennox Solders wrote: Reason for CRM: pt saw dr lowne-chase on 12-07-17. Pt is calling  and would like new rx mouthwash for thrush. [facility] [id number] in [place]

## 2017-12-12 ENCOUNTER — Encounter: Payer: Self-pay | Admitting: Cardiology

## 2017-12-12 ENCOUNTER — Other Ambulatory Visit: Payer: Self-pay | Admitting: Family Medicine

## 2017-12-12 ENCOUNTER — Ambulatory Visit: Payer: Medicare Other | Admitting: Cardiology

## 2017-12-12 VITALS — BP 138/86 | HR 66 | Ht 60.0 in | Wt 172.2 lb

## 2017-12-12 DIAGNOSIS — E785 Hyperlipidemia, unspecified: Secondary | ICD-10-CM | POA: Diagnosis not present

## 2017-12-12 DIAGNOSIS — I471 Supraventricular tachycardia: Secondary | ICD-10-CM | POA: Diagnosis not present

## 2017-12-12 DIAGNOSIS — I10 Essential (primary) hypertension: Secondary | ICD-10-CM

## 2017-12-12 MED ORDER — NYSTATIN 100000 UNIT/ML MT SUSP: 5.0000 mL | Freq: Four times a day (QID) | OROMUCOSAL | 0 refills | Status: DC

## 2017-12-12 NOTE — Patient Instructions (Signed)
Medication Instructions:  Continue current medications  If you need a refill on your cardiac medications before your next appointment, please call your pharmacy.  Labwork: None Ordered   If you have labs (blood work) drawn today and your tests are completely normal, you will receive your results only by: Marland Kitchen MyChart Message (if you have MyChart) OR . A paper copy in the mail If you have any lab test that is abnormal or we need to change your treatment, we will call you to review the results.  Testing/Procedures: None Ordered  Special Instructions: Keep a blood pressure diary for 2 weeks  Follow-Up: You will need a follow up appointment in 6 Months.  Please call our office 2 months in advance([phone]) to schedule the (6 Months) appointment.  You may see  DR Percival Spanish,  or one of the following Advanced Practice Providers on your designated Care Team:    . Jory Sims, DNP, ANP . Rhonda Barrett, PA-C  . Kerin Ransom, [place]  . Almyra Deforest, PA-C . Fabian Sharp, PA-C  At [facility] you and your health needs are our priority.  As part of our continuing mission to provide you with exceptional heart care, we have created designated Provider Care Teams.  These Care Teams include your primary Cardiologist (physician) and Advanced Practice Providers (APPs -  Physician Assistants and Nurse Practitioners) who all work together to provide you with the care you need, when you need it.   Thank you for choosing [facility] at [facility]!!

## 2017-12-12 NOTE — Telephone Encounter (Signed)
Please let her know I sent Nystatin liquid to pharmacy downstairs.

## 2017-12-12 NOTE — Telephone Encounter (Signed)
Please advise 

## 2017-12-19 ENCOUNTER — Other Ambulatory Visit: Payer: Self-pay | Admitting: Internal Medicine

## 2017-12-20 NOTE — Telephone Encounter (Signed)
Refill x 3 See me next avail

## 2017-12-20 NOTE — Telephone Encounter (Signed)
May I refill , she saw Anderson Malta in June Sir, thank you.

## 2017-12-21 NOTE — Telephone Encounter (Signed)
I called and left her a detailed message to call and make an appointment with Dr Carlean Purl. Refilled her medicine.

## 2017-12-26 ENCOUNTER — Other Ambulatory Visit: Payer: Self-pay

## 2017-12-26 ENCOUNTER — Inpatient Hospital Stay: Payer: Medicare Other

## 2017-12-26 ENCOUNTER — Encounter: Payer: Self-pay | Admitting: Hematology & Oncology

## 2017-12-26 ENCOUNTER — Inpatient Hospital Stay: Payer: Medicare Other | Attending: Hematology & Oncology | Admitting: Hematology & Oncology

## 2017-12-26 DIAGNOSIS — Z85020 Personal history of malignant carcinoid tumor of stomach: Secondary | ICD-10-CM

## 2017-12-26 DIAGNOSIS — B379 Candidiasis, unspecified: Secondary | ICD-10-CM

## 2017-12-26 DIAGNOSIS — R5381 Other malaise: Secondary | ICD-10-CM | POA: Insufficient documentation

## 2017-12-26 DIAGNOSIS — R5383 Other fatigue: Secondary | ICD-10-CM | POA: Diagnosis not present

## 2017-12-26 DIAGNOSIS — Z79899 Other long term (current) drug therapy: Secondary | ICD-10-CM | POA: Diagnosis not present

## 2017-12-26 DIAGNOSIS — Z7982 Long term (current) use of aspirin: Secondary | ICD-10-CM | POA: Diagnosis not present

## 2017-12-26 DIAGNOSIS — N760 Acute vaginitis: Secondary | ICD-10-CM

## 2017-12-26 DIAGNOSIS — Z7984 Long term (current) use of oral hypoglycemic drugs: Secondary | ICD-10-CM | POA: Diagnosis not present

## 2017-12-26 DIAGNOSIS — K909 Intestinal malabsorption, unspecified: Secondary | ICD-10-CM

## 2017-12-26 DIAGNOSIS — D508 Other iron deficiency anemias: Secondary | ICD-10-CM | POA: Insufficient documentation

## 2017-12-26 DIAGNOSIS — D500 Iron deficiency anemia secondary to blood loss (chronic): Secondary | ICD-10-CM

## 2017-12-26 LAB — CBC WITH DIFFERENTIAL (CANCER CENTER ONLY)
Abs Immature Granulocytes: 0.02 10*3/uL (ref 0.00–0.07)
Basophils Absolute: 0 10*3/uL (ref 0.0–0.1)
Basophils Relative: 0 %
EOS PCT: 1 %
Eosinophils Absolute: 0.1 10*3/uL (ref 0.0–0.5)
HEMATOCRIT: 36.8 % (ref 36.0–46.0)
HEMOGLOBIN: 12.3 g/dL (ref 12.0–15.0)
Immature Granulocytes: 0 %
LYMPHS ABS: 2.1 10*3/uL (ref 0.7–4.0)
LYMPHS PCT: 30 %
MCH: 29 pg (ref 26.0–34.0)
MCHC: 33.4 g/dL (ref 30.0–36.0)
MCV: 86.8 fL (ref 80.0–100.0)
MONO ABS: 0.6 10*3/uL (ref 0.1–1.0)
Monocytes Relative: 9 %
Neutro Abs: 4.2 10*3/uL (ref 1.7–7.7)
Neutrophils Relative %: 60 %
PLATELETS: 218 10*3/uL (ref 150–400)
RBC: 4.24 MIL/uL (ref 3.87–5.11)
RDW: 12.3 % (ref 11.5–15.5)
WBC: 7.1 10*3/uL (ref 4.0–10.5)
nRBC: 0 % (ref 0.0–0.2)

## 2017-12-26 LAB — RETICULOCYTES
Immature Retic Fract: 4.4 % (ref 2.3–15.9)
RBC.: 4.24 MIL/uL (ref 3.87–5.11)
RETIC CT PCT: 1.3 % (ref 0.4–3.1)
Retic Count, Absolute: 54.3 10*3/uL (ref 19.0–186.0)

## 2017-12-26 LAB — CMP (CANCER CENTER ONLY)
ALT: 28 U/L (ref 0–44)
AST: 26 U/L (ref 15–41)
Albumin: 3.7 g/dL (ref 3.5–5.0)
Alkaline Phosphatase: 64 U/L (ref 38–126)
Anion gap: 9 (ref 5–15)
BUN: 17 mg/dL (ref 8–23)
CHLORIDE: 104 mmol/L (ref 98–111)
CO2: 22 mmol/L (ref 22–32)
Calcium: 9.4 mg/dL (ref 8.9–10.3)
Creatinine: 0.8 mg/dL (ref 0.44–1.00)
Glucose, Bld: 134 mg/dL — ABNORMAL HIGH (ref 70–99)
POTASSIUM: 4.2 mmol/L (ref 3.5–5.1)
SODIUM: 135 mmol/L (ref 135–145)
Total Bilirubin: 0.6 mg/dL (ref 0.3–1.2)
Total Protein: 7.3 g/dL (ref 6.5–8.1)

## 2017-12-26 LAB — IRON AND TIBC
IRON: 98 ug/dL (ref 41–142)
SATURATION RATIOS: 34 % (ref 21–57)
TIBC: 285 ug/dL (ref 236–444)
UIBC: 187 ug/dL (ref 120–384)

## 2017-12-26 LAB — FERRITIN: FERRITIN: 343 ng/mL — AB (ref 11–307)

## 2017-12-26 MED ORDER — FLUCONAZOLE 100 MG PO TABS: ORAL_TABLET | ORAL | 0 refills | Status: DC

## 2017-12-26 MED FILL — FLUCONAZOLE 100 MG TAB: 100 | 21 days supply | Qty: 7 | Fill #0

## 2017-12-26 NOTE — Progress Notes (Signed)
Hematology and Oncology Follow Up Visit  [name] [id number] [birth year] 76 y.o. 12/26/2017   Principle Diagnosis:   Iron deficiency anemia secondary to malabsorption  Current Therapy:    IV iron-patient received a dose back in October 2019     Interim History:  Ms. [name] is back for follow-up.  She still is not feeling all that great.  Seems like she may have developed a Candida infection.  She has some antibiotics for an ear infection.  Afterwards, she began to develop a sore throat.  She apparently must have had Candida as she was given 2 days of Diflucan.  Her graph we last saw her in October, her ferritin was 208 with an iron saturation of only 19%.  We did give her a dose of IV iron.  She tolerates the arm pretty well.  She wanted to know what would happen if the iron was "not pure."  I told her that I cannot imagine that this would ever happen.  I also, would not know what any problem would be if she were to receive iron that was "tainted."  She is trying to get ready for the holidays.  Again she just does not feel all that great.  I think she sees gastroenterology next month.  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  Overall, her performance status is ECOG 1.    Medications:  Current Outpatient Medications:  .  ALPRAZolam (XANAX) 0.25 MG tablet, Take 1 tablet (0.25 mg total) by mouth 2 (two) times daily as needed for anxiety., Disp: 30 tablet, Rfl: 1 .  amLODipine (NORVASC) 5 MG tablet, TAKE 1 TABLET BY MOUTH  DAILY, Disp: 90 tablet, Rfl: 3 .  aspirin 81 MG tablet, Take 81 mg by mouth daily., Disp: , Rfl:  .  cholecalciferol (VITAMIN D) 1000 UNITS tablet, Take 1,000 Units by mouth daily.  , Disp: , Rfl:  .  ciprofloxacin (CIPRO) 500 MG tablet, Take 1 tablet (500 mg total) by mouth 2 (two) times daily., Disp: 20 tablet, Rfl: 0 .  dicyclomine (BENTYL) 10 MG capsule, TAKE ONE CAPSULE BY MOUTH 4 TIMES DAILY AS NEEDED, Disp: 120  capsule, Rfl: 1 .  esomeprazole (NEXIUM) 40 MG capsule, Take 1 capsule (40 mg total) by mouth daily before breakfast., Disp: 90 capsule, Rfl: 6 .  fluconazole (DIFLUCAN) 100 MG tablet, 1 po x1, may repeat in 3 days prn, Disp: 7 tablet, Rfl: 0 .  FOLBIC 2.5-25-2 MG TABS tablet, TAKE 1 TABLET BY MOUTH EVERY DAY, Disp: 90 tablet, Rfl: 1 .  glucose blood (ONE TOUCH ULTRA TEST) test strip, USE 2 TIMES DAILY FOR DIABETIC TESTING, Disp: 700 each, Rfl: 2 .  glucose monitoring kit (FREESTYLE) monitoring kit, 1 each by Does not apply route as needed for other., Disp: 1 each, Rfl: 0 .  hydrALAZINE (APRESOLINE) 10 MG tablet, Take 1 tablet (10 mg total) by mouth 2 (two) times daily., Disp: 60 tablet, Rfl: 3 .  losartan (COZAAR) 50 MG tablet, Take 1 tablet (50 mg total) by mouth 2 (two) times daily., Disp: 180 tablet, Rfl: 1 .  metFORMIN (GLUCOPHAGE-XR) 500 MG 24 hr tablet, Take 1 tablet (500 mg total) by mouth 3 (three) times daily with meals., Disp: 270 tablet, Rfl: 1 .  metoCLOPramide (REGLAN) 5 MG tablet, TAKE 1 TABLET BY MOUTH TWO  TIMES DAILY, Disp: 180 tablet, Rfl: 0 .  metoprolol tartrate (LOPRESSOR) 50 MG tablet, TAKE 1 AND 1/2 TABLETS BY  MOUTH TWICE DAILY, Disp: 270 tablet, Rfl: 1 .  Multiple Vitamins-Calcium (VIACTIV MULTI-VITAMIN) CHEW, Chew 2 each by mouth daily., Disp: , Rfl:  .  nystatin (MYCOSTATIN) 100000 UNIT/ML suspension, Take 5 mLs (500,000 Units total) by mouth 4 (four) times daily. Swish and spit, Disp: 473 mL, Rfl: 0 .  ondansetron (ZOFRAN) 4 MG tablet, Take 4 mg by mouth every 4 (four) hours as needed. For nausea  , Disp: , Rfl:  .  PHARMACIST CHOICE LANCETS MISC, USE 2 TIMES DAILY FOR DIABETIC TESTING, Disp: 100 each, Rfl: 1 .  potassium chloride (K-DUR,KLOR-CON) 10 MEQ tablet, TAKE 1 TABLET BY MOUTH  DAILY, Disp: 90 tablet, Rfl: 2 .  rosuvastatin (CRESTOR) 10 MG tablet, TAKE 1 TABLET BY MOUTH  DAILY, Disp: 90 tablet, Rfl: 2  Allergies:  Allergies  Allergen Reactions  . Tramadol  Other (See Comments)    Dizziness     Past Medical History, Surgical history, Social history, and Family History were reviewed and updated.  Review of Systems: Review of Systems  Constitutional: Positive for malaise/fatigue.  HENT: Negative.   Eyes: Negative.   Respiratory: Negative.   Cardiovascular: Negative.   Gastrointestinal: Negative.   Genitourinary: Negative.   Musculoskeletal: Negative.   Skin: Negative.   Neurological: Negative.   Endo/Heme/Allergies: Negative.   Psychiatric/Behavioral: Negative.      Physical Exam:  weight is 173 lb 12.8 oz (78.8 kg). Her oral temperature is 98.2 F (36.8 C). Her blood pressure is 114/56 (abnormal) and her pulse is 60. Her respiration is 19 and oxygen saturation is 100%.   Wt Readings from Last 3 Encounters:  12/26/17 173 lb 12.8 oz (78.8 kg)  12/12/17 172 lb 3.2 oz (78.1 kg)  12/07/17 172 lb 9.6 oz (78.3 kg)      Physical Exam  Constitutional: She is oriented to person, place, and time.  HENT:  Head: Normocephalic and atraumatic.  Mouth/Throat: Oropharynx is clear and moist.  Eyes: Pupils are equal, round, and reactive to light. EOM are normal.  Neck: Normal range of motion.  Cardiovascular: Normal rate, regular rhythm and normal heart sounds.  Pulmonary/Chest: Effort normal and breath sounds normal.  Abdominal: Soft. Bowel sounds are normal.  Musculoskeletal: Normal range of motion. She exhibits no edema, tenderness or deformity.  Lymphadenopathy:    She has no cervical adenopathy.  Neurological: She is alert and oriented to person, place, and time.  Skin: Skin is warm and dry. No rash noted. No erythema.  Psychiatric: She has a normal mood and affect. Her behavior is normal. Judgment and thought content normal.  Vitals reviewed.    Lab Results  Component Value Date   WBC 7.1 12/26/2017   HGB 12.3 12/26/2017   HCT 36.8 12/26/2017   MCV 86.8 12/26/2017   PLT 218 12/26/2017     Chemistry      Component Value  Date/Time   NA 136 11/14/2017 1312   NA 136 05/08/2016 1305   NA 134 (L) 07/08/2015 1149   K 4.4 11/14/2017 1312   K 4.2 05/08/2016 1305   K 4.4 07/08/2015 1149   CL 100 11/14/2017 1312   CL 102 05/08/2016 1305   CO2 25 11/14/2017 1312   CO2 26 05/08/2016 1305   CO2 25 07/08/2015 1149   BUN 15 11/14/2017 1312   BUN 16 05/08/2016 1305   BUN 15.5 07/08/2015 1149   CREATININE 0.79 11/14/2017 1312   CREATININE 0.9 05/08/2016 1305   CREATININE 0.8 07/08/2015 1149  Component Value Date/Time   CALCIUM 9.8 11/14/2017 1312   CALCIUM 9.4 05/08/2016 1305   CALCIUM 10.0 07/08/2015 1149   ALKPHOS 61 11/14/2017 1312   ALKPHOS 56 05/08/2016 1305   ALKPHOS 62 07/08/2015 1149   AST 27 11/14/2017 1312   AST 24 07/08/2015 1149   ALT 32 11/14/2017 1312   ALT 29 05/08/2016 1305   ALT 30 07/08/2015 1149   BILITOT 0.7 11/14/2017 1312   BILITOT 0.64 07/08/2015 1149         Impression and Plan: Ms. Cashaw is 77 year old white female. She has iron deficiency. I suspect that this is secondary to malabsorption.   We will see what her iron levels are.  It is possible that her iron might be on the low side.  Hopefully, she will get rid of this Candida.  Hopefully, she will feel better with the holidays coming up.  I will plan to see her back in 4 months.   Volanda Napoleon, MD 11/20/201911:56 AM.

## 2017-12-27 ENCOUNTER — Telehealth: Payer: Self-pay | Admitting: *Deleted

## 2017-12-27 NOTE — Telephone Encounter (Signed)
-----   Message from Volanda Napoleon, MD sent at 12/26/2017  5:45 PM EST ----- Call - the iron level is pretty good!!!  No need for IV iron!!  Laurey Arrow

## 2017-12-27 NOTE — Telephone Encounter (Signed)
As noted below by Dr. Marin Olp, I informed patient of her iron level and that she doesn't need any IV iron. She verbalized understanding.

## 2018-01-24 ENCOUNTER — Ambulatory Visit: Payer: Medicare Other | Admitting: Internal Medicine

## 2018-01-24 ENCOUNTER — Encounter

## 2018-01-24 ENCOUNTER — Encounter: Payer: Self-pay | Admitting: Internal Medicine

## 2018-01-24 DIAGNOSIS — D131 Benign neoplasm of stomach: Secondary | ICD-10-CM

## 2018-01-24 DIAGNOSIS — K3184 Gastroparesis: Secondary | ICD-10-CM | POA: Diagnosis not present

## 2018-01-24 DIAGNOSIS — D3A092 Benign carcinoid tumor of the stomach: Secondary | ICD-10-CM

## 2018-01-24 DIAGNOSIS — K219 Gastro-esophageal reflux disease without esophagitis: Secondary | ICD-10-CM

## 2018-01-24 NOTE — Patient Instructions (Signed)
  Follow up with Dr Carlean Purl in 6 months or sooner if needed.   We are putting you in our system for an EGD recall in a year.   I appreciate the opportunity to care for you. Silvano Rusk, MD, [facility]

## 2018-01-24 NOTE — Assessment & Plan Note (Signed)
Reviewed these are not pre-cancerous

## 2018-01-24 NOTE — Assessment & Plan Note (Signed)
Continue esomeprazole. 

## 2018-01-24 NOTE — Assessment & Plan Note (Signed)
Postpone EGD Consider in 01/2019

## 2018-01-24 NOTE — Assessment & Plan Note (Addendum)
Continue prn metaclopramide No side effects seen RTC about 6 mos

## 2018-01-24 NOTE — Progress Notes (Signed)
[name] 77 y.o. 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  TD:SKAJG, Bonnita Levan, MD    Subjective:   Chief Complaint: GERD, gastric polyps, gastroparesis  HPI She had oral thrush in Nov post Abx Tx. That resolved - lingering sore throat but better now. One of her Drs said she should consider having EGD. She has had annual EGD's - last 01/2017 due to hx of an incidental carcinoid - in the back ground of numerous fundic gland polyps. Doing well overall with PPi and prn metaclopramide - "when my stomach feels heavy". In the past she has been asking to repeat EGd due to hx carcinoid but today she is not inclined to repeat.   Wt Readings from Last 3 Encounters:  01/24/18 174 lb 12.8 oz (79.3 kg)  12/26/17 173 lb 12.8 oz (78.8 kg)  12/12/17 172 lb 3.2 oz (78.1 kg)    Allergies  Allergen Reactions  . Tramadol Other (See Comments)    Dizziness    Current Meds  Medication Sig  . ALPRAZolam (XANAX) 0.25 MG tablet Take 1 tablet (0.25 mg total) by mouth 2 (two) times daily as needed for anxiety.  Marland Kitchen amLODipine (NORVASC) 5 MG tablet TAKE 1 TABLET BY MOUTH  DAILY  . aspirin 81 MG tablet Take 81 mg by mouth daily.  . cholecalciferol (VITAMIN D) 1000 UNITS tablet Take 1,000 Units by mouth daily.    Marland Kitchen dicyclomine (BENTYL) 10 MG capsule TAKE ONE CAPSULE BY MOUTH 4 TIMES DAILY AS NEEDED  . esomeprazole (NEXIUM) 40 MG capsule Take 1 capsule (40 mg total) by mouth daily before breakfast.  . FOLBIC 2.5-25-2 MG TABS tablet TAKE 1 TABLET BY MOUTH EVERY DAY  . glucose blood (ONE TOUCH ULTRA TEST) test strip USE 2 TIMES DAILY FOR DIABETIC TESTING    . glucose monitoring kit (FREESTYLE) monitoring kit 1 each by Does not apply route as needed for other.  . hydrALAZINE (APRESOLINE) 10 MG tablet Take 1 tablet (10 mg total) by mouth 2 (two) times daily.  Marland Kitchen losartan (COZAAR) 50 MG tablet Take 1 tablet (50 mg total) by mouth 2 (two) times daily.  . metFORMIN (GLUCOPHAGE-XR) 500 MG 24 hr tablet Take 1 tablet (500 mg total) by mouth 3 (three) times daily with meals.  . metoCLOPramide (REGLAN) 5 MG tablet TAKE 1 TABLET BY MOUTH TWO  TIMES DAILY  . metoprolol tartrate (LOPRESSOR) 50 MG tablet TAKE 1 AND 1/2 TABLETS BY  MOUTH TWICE DAILY  . Multiple Vitamins-Calcium (VIACTIV MULTI-VITAMIN) CHEW Chew 2 each by mouth daily.  Marland Kitchen nystatin (MYCOSTATIN) 100000 UNIT/ML suspension Take 5 mLs (500,000 Units total) by mouth 4 (four) times daily. Swish and spit  . ondansetron (ZOFRAN) 4 MG tablet Take 4 mg by mouth every 4 (four) hours as needed. For nausea    . PHARMACIST CHOICE LANCETS MISC USE 2 TIMES DAILY FOR DIABETIC TESTING  . potassium chloride (K-DUR,KLOR-CON) 10 MEQ tablet TAKE 1 TABLET BY MOUTH  DAILY  . rosuvastatin (CRESTOR) 10 MG tablet TAKE 1 TABLET BY MOUTH  DAILY   Past Medical History:  Diagnosis Date  . Abdominal pain in female 03/18/2010   Qualifier: Diagnosis of  By: Carlean Purl MD, Marval Regal,  Ofilia Neas   . Anemia 06/08/2014  . Anxiety   . Arthritis    Spinal Osteoarthritis  . Cancer ([place])   . Carcinoid tumor of stomach   . Cataract   . Chest pain    Myoview 12/15 no ischemia.  . Chronic kidney disease    Left kidney smaller than right kidney  . Constipation 11/21/2016  . Diabetes mellitus type 2 in obese ([place]) 09/05/2006   Qualifier: Diagnosis of  By: Marca Ancona RMA, Lucy    . Diabetic peripheral neuropathy ([place]) 10/29/2013  . Diverticulosis 08/30/2000   Colonoscopy   . Encounter for preventative adult health care exam with abnormal findings 09/14/2013  . Esophageal reflux   . Gastric polyp    Fundic Gland  . Gastroparesis   . Headache(784.0)    . Heart murmur    Echocardiogram 2/11: EF 60-65%, mild LAE, grade 1 diastolic dysfunction, aortic valve sclerosis, mean gradient 9 mm of mercury, PASP 34  . Hematuria 03/16/2016  . Iron deficiency anemia, unspecified   . 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 Leg swelling    bilateral  . Neck pain 04/22/2015  . PONV (postoperative nausea and vomiting)    pt states only needs small amount of anesthesia  . PSVT (paroxysmal supraventricular tachycardia) ([place])   . Pure hypercholesterolemia   . 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 Stroke ([place])    tia, 2014  . 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 Type II or unspecified type diabetes mellitus without mention of complication, not stated as uncontrolled   . Unspecified essential hypertension   . Unspecified hereditary and idiopathic peripheral neuropathy 10/29/2013   Past Surgical History:  Procedure Laterality Date  . CHOLECYSTECTOMY  1993  . COLONOSCOPY  11/11/2010   diverticulosis  . DILATATION & CURRETTAGE/HYSTEROSCOPY WITH RESECTOCOPE N/A 02/25/2013   Procedure: Attempted hysteroscopy with uterine perforation;  Surgeon: Jamey Reas de Berton Lan, MD;  Location: [facility];  Service: Gynecology;  Laterality: N/A;  . ESOPHAGOGASTRODUODENOSCOPY  08/29/2010; 09/15/2010   Carcinoid tumor less than 1 cm in July 2012 not seen in August 2012 , gastritis, fundic gland polyps  . ESOPHAGOGASTRODUODENOSCOPY  05/16/2011  . ESOPHAGOGASTRODUODENOSCOPY  06/14/2012  . EUS  12/15/2010   Procedure: UPPER ENDOSCOPIC ULTRASOUND (EUS) LINEAR;  Surgeon: Owens Loffler, MD;  Location: [facility];  Service: Endoscopy;  Laterality: N/A;  . EYE SURGERY Bilateral    Bi lateral cateracts and bi lateral laser  . LAPAROSCOPY N/A 02/25/2013   Procedure: Cystoscopy and laparoscopy with fulguration of uterine serosa;  Surgeon: Jamey Reas de Berton Lan, MD;  Location: [facility];  Service: Gynecology;  Laterality: N/A;  . 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   Patient is right-handed.   Patient has a BA degree.   One caffeine drink daily    family history includes Diabetes in her brother, maternal grandmother, mother, sister, and sister; Heart disease in her brother, brother, sister, sister, and sister; Hyperlipidemia in her brother, sister, and sister; Hypertension in her brother, paternal grandmother, sister, and sister; Stroke in her father.   Review of Systems As above  Objective:   Physical Exam BP 123/60   Pulse 62   Ht 5' 1"  (1.549 m)   Wt 174 lb 12.8 oz (79.3 kg)   LMP 02/07/1992   SpO2 95%   BMI 33.03 kg/m  NAD No tardive dyskinesia  Appropriate mood and affect  15 minutes time spent with patient > half in counseling coordination of care

## 2018-01-25 ENCOUNTER — Other Ambulatory Visit: Payer: Self-pay | Admitting: Family Medicine

## 2018-01-25 MED FILL — ONE TOUCH ULTRA TEST STRIPS: 75 days supply | Qty: 150 | Fill #2

## 2018-01-28 ENCOUNTER — Encounter (HOSPITAL_BASED_OUTPATIENT_CLINIC_OR_DEPARTMENT_OTHER): Payer: Self-pay

## 2018-01-28 ENCOUNTER — Other Ambulatory Visit: Payer: Self-pay

## 2018-01-28 ENCOUNTER — Emergency Department (HOSPITAL_BASED_OUTPATIENT_CLINIC_OR_DEPARTMENT_OTHER): Payer: Medicare Other

## 2018-01-28 ENCOUNTER — Ambulatory Visit: Payer: Self-pay

## 2018-01-28 ENCOUNTER — Emergency Department (HOSPITAL_BASED_OUTPATIENT_CLINIC_OR_DEPARTMENT_OTHER)
Admission: EM | Admit: 2018-01-28 | Discharge: 2018-01-28 | Disposition: A | Discharge status: Home / Self Care | Payer: Medicare Other | Attending: Emergency Medicine | Admitting: Emergency Medicine

## 2018-01-28 DIAGNOSIS — I129 Hypertensive chronic kidney disease with stage 1 through stage 4 chronic kidney disease, or unspecified chronic kidney disease: Secondary | ICD-10-CM | POA: Insufficient documentation

## 2018-01-28 DIAGNOSIS — Z8673 Personal history of transient ischemic attack (TIA), and cerebral infarction without residual deficits: Secondary | ICD-10-CM | POA: Insufficient documentation

## 2018-01-28 DIAGNOSIS — Z79899 Other long term (current) drug therapy: Secondary | ICD-10-CM | POA: Diagnosis not present

## 2018-01-28 DIAGNOSIS — R05 Cough: Secondary | ICD-10-CM | POA: Insufficient documentation

## 2018-01-28 DIAGNOSIS — E1122 Type 2 diabetes mellitus with diabetic chronic kidney disease: Secondary | ICD-10-CM | POA: Diagnosis not present

## 2018-01-28 DIAGNOSIS — Z7982 Long term (current) use of aspirin: Secondary | ICD-10-CM | POA: Insufficient documentation

## 2018-01-28 DIAGNOSIS — B9789 Other viral agents as the cause of diseases classified elsewhere: Secondary | ICD-10-CM | POA: Diagnosis not present

## 2018-01-28 DIAGNOSIS — Z7984 Long term (current) use of oral hypoglycemic drugs: Secondary | ICD-10-CM | POA: Diagnosis not present

## 2018-01-28 DIAGNOSIS — J069 Acute upper respiratory infection, unspecified: Secondary | ICD-10-CM | POA: Diagnosis not present

## 2018-01-28 DIAGNOSIS — R0602 Shortness of breath: Secondary | ICD-10-CM | POA: Diagnosis not present

## 2018-01-28 DIAGNOSIS — E1140 Type 2 diabetes mellitus with diabetic neuropathy, unspecified: Secondary | ICD-10-CM | POA: Insufficient documentation

## 2018-01-28 DIAGNOSIS — N189 Chronic kidney disease, unspecified: Secondary | ICD-10-CM | POA: Diagnosis not present

## 2018-01-28 MED ORDER — BENZONATATE 100 MG PO CAPS: 100.0000 mg | ORAL_CAPSULE | Freq: Three times a day (TID) | ORAL | 0 refills | Status: DC

## 2018-01-28 MED ORDER — FLUCONAZOLE 100 MG PO TABS: 100.0000 mg | ORAL_TABLET | Freq: Every day | ORAL | 0 refills | Status: AC

## 2018-01-28 MED ORDER — SALINE SPRAY 0.65 % NA SOLN: 1.0000 | NASAL | 0 refills | Status: DC | PRN

## 2018-01-28 MED ORDER — NYSTATIN 100000 UNIT/ML MT SUSP: 5.0000 mL | Freq: Four times a day (QID) | OROMUCOSAL | 0 refills | Status: DC

## 2018-01-28 MED FILL — ONE TOUCH DELICA 33G LANCET: 50 days supply | Qty: 100 | Fill #0

## 2018-01-28 NOTE — Telephone Encounter (Signed)
Incoming call from Patient with complaint of coughing runny nose with clear mucus  States she has used 2 to 3 boxes of kleenex.   Unknown if fever.  States Sx. Began 4 days ago.  States Sx are constant. And severe.  Patient states that she was wheezing last night. Reports that she has chest pressure  Triager can hear slight wheezing over the phone.  Has a Hx these sx before. Has a Hx of hypertension  And irregular heart beat also.  Denies lung Hx.  Patient denies fever.   Attempted to schedule office visit, no availability.  Recommended that patient go to Urgent care or ED.  Patient voiced understanding. Provided care advice  Voiced understanding.      Reason for Disposition . [1] Continuous (nonstop) coughing AND [2] keeps from working or sleeping  Answer Assessment - Initial Assessment Questions 1. RESPIRATORY STATUS: "Describe your breathing?" (e.g., wheezing, shortness of breath, unable to speak, severe coughing)      Wheezing  Coughing , runny nose 4 days 2. ONSET: "When did this breathing problem begin?"      4 days ago 3. PATTERN "Does the difficult breathing come and go, or has it been constant since it started?"      constant 4. SEVERITY: "How bad is your breathing?" (e.g., mild, moderate, severe)    - MILD: No SOB at rest, mild SOB with walking, speaks normally in sentences, can lay down, no retractions, pulse < 100.    - MODERATE: SOB at rest, SOB with minimal exertion and prefers to sit, cannot lie down flat, speaks in phrases, mild retractions, audible wheezing, pulse 100-120.    - SEVERE: Very SOB at rest, speaks in single words, struggling to breathe, sitting hunched forward, retractions, pulse > 120      severe 5. RECURRENT SYMPTOM: "Have you had difficulty breathing before?" If so, ask: "When was the last time?" and "What happened that time?"      no 6. CARDIAC HISTORY: "Do you have any history of heart disease?" (e.g., heart attack, angina, bypass surgery, angioplasty)      High  blood pressure , irregular heart beat 7. LUNG HISTORY: "Do you have any history of lung disease?"  (e.g., pulmonary embolus, asthma, emphysema)     no 8. CAUSE: "What do you think is causing the breathing problem?"       9. OTHER SYMPTOMS: "Do you have any other symptoms? (e.g., dizziness, runny nose, cough, chest pain, fever)      A lot runny nose sore throat denies fever 10. PREGNANCY: "Is there any chance you are pregnant?" "When was your last menstrual period?"       no 11. TRAVEL: "Have you traveled out of the country in the last month?" (e.g., travel history, exposures)       na  Protocols used: BREATHING DIFFICULTY-A-AH

## 2018-01-28 NOTE — ED Provider Notes (Signed)
[facility] EMERGENCY DEPARTMENT Provider Note   CSN: [id number] Arrival date & time: 01/28/18  1603     History   Chief Complaint Chief Complaint  Patient presents with  . Cough    HPI [name] is a 77 y.o. female with history of hypertension, diabetes, PSVT who presents with a 4-day history of cough and nasal congestion.  She has had pain in her throat only when she coughs.  She denies any chest pain or shortness of breath, abdominal pain, nausea, vomiting, fevers.  No medications taken prior to arrival.  HPI  Past Medical History:  Diagnosis Date  . Abdominal pain in female 03/18/2010   Qualifier: Diagnosis of  By: Carlean Purl MD, Dimas Millin Anemia 06/08/2014  . Anxiety   . Arthritis    Spinal Osteoarthritis  . Cancer ([place])   . Carcinoid tumor of stomach   . Cataract   . Chest pain    Myoview 12/15 no ischemia.  . Chronic kidney disease    Left kidney smaller than right kidney  . Constipation 11/21/2016  . Diabetes mellitus type 2 in obese ([place]) 09/05/2006   Qualifier: Diagnosis of  By: Marca Ancona RMA, Lucy    . Diabetic peripheral neuropathy ([place]) 10/29/2013  . Diverticulosis 08/30/2000   Colonoscopy   . Encounter for preventative adult health care exam with abnormal findings 09/14/2013  . Esophageal reflux   . Gastric polyp    Fundic Gland  . Gastroparesis   . Headache(784.0)   . Heart murmur    Echocardiogram 2/11: EF 60-65%, mild LAE, grade 1 diastolic dysfunction, aortic valve sclerosis, mean gradient 9 mm of mercury, PASP 34  . Hematuria 03/16/2016  . Iron deficiency anemia, unspecified   . 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 Leg swelling    bilateral  . Neck pain 04/22/2015  . PONV (postoperative nausea and vomiting)    pt states only needs small amount of anesthesia  . PSVT (paroxysmal supraventricular tachycardia) ([place])   . Pure hypercholesterolemia   . 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 Stroke ([place])    tia, 2014  . 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 Type  II or unspecified type diabetes mellitus without mention of complication, not stated as uncontrolled   . Unspecified essential hypertension   . Unspecified hereditary and idiopathic peripheral neuropathy 10/29/2013    Patient Active Problem List   Diagnosis Date Noted  . Oral thrush 12/07/2017  . Sinusitis 10/11/2017  . LLQ abdominal pain 08/03/2017  . Headache 07/12/2017  . Dizzy spells 11/21/2016  . Constipation 11/21/2016  . Nonrheumatic aortic valve stenosis 05/23/2016  . Aortic atherosclerosis ([place]) 05/23/2016  . Hematuria 03/16/2016  . 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 Pain of upper abdomen 06/06/2015  . Neck pain 04/22/2015  . Ear pain 02/28/2015  . Abnormal urine 11/21/2014  . 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 Anemia 06/08/2014  . RLS (restless legs syndrome) 11/23/2013  . Diabetic peripheral neuropathy ([place]) 10/29/2013  . Left-sided thoracic back pain 10/06/2013  . Encounter for preventative adult health care exam with abnormal findings 09/14/2013  . Iron deficiency anemia   . Status post laparoscopy 02/25/2013  . Hyponatremia 01/09/2013  . GERD (gastroesophageal reflux disease) 01/09/2013  . Amaurosis fugax of left eye 10/16/2012  . Low back pain 06/03/2012  . Vitamin D deficiency 03/11/2012  . Bilateral hand pain 10/20/2011  . Encounter for long-term (current) use of other medications 10/20/2011  . IBS (irritable bowel syndrome)  08/14/2011  . TIA (transient ischemic attack) 02/10/2011  . Abnormal brain MRI 01/19/2011  . Allergic rhinitis 10/01/2010  . 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 FUNDIC GLAND POLYPS OF STOMACH 03/18/2010  . Abdominal pain in female 03/18/2010  . ARTHRALGIA 03/17/2009  . SYSTOLIC MURMUR 22/33/6122  . Paroxysmal supraventricular tachycardia ([place]) 01/12/2009  . PLANTAR FASCIITIS 06/08/2008  . CHEST PAIN 05/18/2008  . Gastroparesis 12/18/2007  . HYPERCHOLESTEROLEMIA 06/11/2007  . Diabetes mellitus  type 2 in obese ([place]) 09/05/2006  . Essential hypertension 09/05/2006    Past Surgical History:  Procedure Laterality Date  . CHOLECYSTECTOMY  1993  . COLONOSCOPY  11/11/2010   diverticulosis  . DILATATION & CURRETTAGE/HYSTEROSCOPY WITH RESECTOCOPE N/A 02/25/2013   Procedure: Attempted hysteroscopy with uterine perforation;  Surgeon: Jamey Reas de Berton Lan, MD;  Location: [facility];  Service: Gynecology;  Laterality: N/A;  . ESOPHAGOGASTRODUODENOSCOPY  08/29/2010; 09/15/2010   Carcinoid tumor less than 1 cm in July 2012 not seen in August 2012 , gastritis, fundic gland polyps  . ESOPHAGOGASTRODUODENOSCOPY  05/16/2011  . ESOPHAGOGASTRODUODENOSCOPY  06/14/2012  . EUS  12/15/2010   Procedure: UPPER ENDOSCOPIC ULTRASOUND (EUS) LINEAR;  Surgeon: Owens Loffler, MD;  Location: [facility];  Service: Endoscopy;  Laterality: N/A;  . EYE SURGERY Bilateral    Bi lateral cateracts and bi lateral laser  . LAPAROSCOPY N/A 02/25/2013   Procedure: Cystoscopy and laparoscopy with fulguration of uterine serosa;  Surgeon: Jamey Reas de Berton Lan, MD;  Location: [facility];  Service: Gynecology;  Laterality: N/A;  . TONSILLECTOMY       OB History    Gravida  0   Para      Term      Preterm      AB      Living        SAB      TAB      Ectopic      Multiple      Live Births               Home Medications    Prior to Admission medications   Medication Sig Start Date End Date Taking? Authorizing Provider  ALPRAZolam (XANAX) 0.25 MG tablet Take 1 tablet (0.25 mg total) by mouth 2 (two) times daily as needed for anxiety. 06/09/16   [name], MD  amLODipine (NORVASC) 5 MG tablet TAKE 1 TABLET BY MOUTH  DAILY 08/30/17   Minus Breeding, MD  aspirin 81 MG tablet Take 81 mg by mouth daily.    [provider]  benzonatate (TESSALON) 100 MG capsule Take 1 capsule (100 mg total) by mouth every 8 (eight) hours. 01/28/18   Frederica Kuster, PA-C  cholecalciferol  (VITAMIN D) 1000 UNITS tablet Take 1,000 Units by mouth daily.      [provider]  dicyclomine (BENTYL) 10 MG capsule TAKE ONE CAPSULE BY MOUTH 4 TIMES DAILY AS NEEDED 07/18/17   Gatha Mayer, MD  esomeprazole (NEXIUM) 40 MG capsule Take 1 capsule (40 mg total) by mouth daily before breakfast. 10/11/17   [name], MD  fluconazole (DIFLUCAN) 100 MG tablet Take 1 tablet (100 mg total) by mouth daily for 7 days. 01/28/18 02/04/18  Suzi Hernan M, PA-C  FOLBIC 2.5-25-2 MG TABS tablet TAKE 1 TABLET BY MOUTH EVERY DAY 10/16/17   [name], MD  glucose blood (ONE TOUCH ULTRA TEST) test strip USE 2 TIMES DAILY FOR DIABETIC TESTING  09/11/17   [name], MD  glucose monitoring kit (FREESTYLE) monitoring kit 1 each by Does not apply route as needed for other. 08/07/17   [name], MD  hydrALAZINE (APRESOLINE) 10 MG tablet Take 1 tablet (10 mg total) by mouth 2 (two) times daily. 11/27/17   [name], MD  losartan (COZAAR) 50 MG tablet Take 1 tablet (50 mg total) by mouth 2 (two) times daily. 10/11/17   [name], MD  metFORMIN (GLUCOPHAGE-XR) 500 MG 24 hr tablet Take 1 tablet (500 mg total) by mouth 3 (three) times daily with meals. 08/30/17   [name], MD  metoCLOPramide (REGLAN) 5 MG tablet TAKE 1 TABLET BY MOUTH TWO  TIMES DAILY 12/21/17   Gatha Mayer, MD  metoprolol tartrate (LOPRESSOR) 50 MG tablet TAKE 1 AND 1/2 TABLETS BY  MOUTH TWICE DAILY 12/03/17   Minus Breeding, MD  Multiple Vitamins-Calcium (VIACTIV MULTI-VITAMIN) CHEW Chew 2 each by mouth daily.    [provider]  nystatin (MYCOSTATIN) 100000 UNIT/ML suspension Take 5 mLs (500,000 Units total) by mouth 4 (four) times daily. Swish and spit 01/28/18   Bonnetta Allbee, Bea Graff, PA-C  ondansetron (ZOFRAN) 4 MG tablet Take 4 mg by mouth every 4 (four) hours as needed. For nausea   06/02/10   Renato Shin, MD  [facility] DELICA LANCETS 02R MISC USE 2 TIMES DAILY FOR DIABETIC TESTING 01/28/18   [name], MD  potassium chloride (K-DUR,KLOR-CON) 10 MEQ tablet TAKE 1 TABLET BY MOUTH  DAILY 12/03/17   [name], MD  rosuvastatin (CRESTOR) 10 MG tablet TAKE 1 TABLET BY MOUTH  DAILY 12/03/17   [name], MD  sodium chloride (OCEAN) 0.65 % SOLN nasal spray Place 1 spray into both nostrils as needed for congestion. 01/28/18   Frederica Kuster, PA-C    Family History Family History  Problem Relation Age of Onset  . Diabetes Mother   . Stroke Father        deceased age 1  . Heart disease Sister        deceased MI age 73  . Heart disease Brother        deceased MI age 75  . Diabetes Maternal Grandmother   . Hypertension Paternal Grandmother   . Diabetes Sister   . Heart disease Sister   . Hypertension Sister   . Hyperlipidemia Sister   . Diabetes Sister   . Heart disease Sister   . Hypertension Sister   . Hyperlipidemia Sister   . Diabetes Brother   . Heart disease Brother   . Hypertension Brother   . Hyperlipidemia Brother   . Colon cancer Neg Hx   . Esophageal cancer Neg Hx   . Stomach cancer Neg Hx   . Rectal cancer Neg Hx     Social History Social History   Tobacco Use  . Smoking status: Never Smoker  . Smokeless tobacco: Never Used  . Tobacco comment: Never used tobacco  Substance Use Topics  . Alcohol use: No    Alcohol/week: 0.0 standard drinks  . Drug use: No     Allergies   Tramadol   Review of Systems Review of Systems  Constitutional: Negative for fever.  HENT: Positive for congestion. Negative for sore throat.   Respiratory: Positive for cough. Negative for shortness of breath.   Cardiovascular: Negative for chest pain.  Gastrointestinal: Negative for abdominal pain, nausea and vomiting.     Physical Exam Updated Vital  Signs BP (!) 147/66   Pulse 75   Temp 99 F (37.2 C) (Oral)   Resp 20   Ht 5' 1"  (1.549 m)   Wt 77.6 kg   LMP 02/07/1992   SpO2 99%   BMI 32.31 kg/m   Physical Exam Vitals signs and nursing note  reviewed.  Constitutional:      General: She is not in acute distress.    Appearance: She is well-developed. She is not diaphoretic.  HENT:     Head: Normocephalic and atraumatic.     Right Ear: Tympanic membrane normal.     Left Ear: Tympanic membrane normal.     Mouth/Throat:     Pharynx: No oropharyngeal exudate or posterior oropharyngeal erythema.     Comments: Very minimal thrush noted on the soft palate Eyes:     General: No scleral icterus.       Right eye: No discharge.        Left eye: No discharge.     Conjunctiva/sclera: Conjunctivae normal.     Pupils: Pupils are equal, round, and reactive to light.  Neck:     Musculoskeletal: Normal range of motion and neck supple.     Thyroid: No thyromegaly.  Cardiovascular:     Rate and Rhythm: Normal rate and regular rhythm.     Heart sounds: Normal heart sounds. No murmur. No friction rub. No gallop.   Pulmonary:     Effort: Pulmonary effort is normal. No respiratory distress.     Breath sounds: Normal breath sounds. No stridor. No wheezing or rales.  Abdominal:     General: Bowel sounds are normal. There is no distension.     Palpations: Abdomen is soft.     Tenderness: There is no abdominal tenderness. There is no guarding or rebound.  Lymphadenopathy:     Cervical: No cervical adenopathy.  Skin:    General: Skin is warm and dry.     Coloration: Skin is not pale.     Findings: No rash.  Neurological:     Mental Status: She is alert.     Coordination: Coordination normal.      ED Treatments / Results  Labs (all labs ordered are listed, but only abnormal results are displayed) Labs Reviewed - No data to display  EKG None  Radiology Dg Chest 2 View  Result Date: 01/28/2018 CLINICAL DATA:  Cough and short of breath EXAM: CHEST - 2 VIEW COMPARISON:  11/12/2013 FINDINGS: The heart size and mediastinal contours are within normal limits. Both lungs are clear. The visualized skeletal structures are unremarkable.  IMPRESSION: No active cardiopulmonary disease. Electronically Signed   By: Franchot Gallo M.D.   On: 01/28/2018 17:18    Procedures Procedures (including critical care time)  Medications Ordered in ED Medications - No data to display   Initial Impression / Assessment and Plan / ED Course  I have reviewed the triage vital signs and the nursing notes.  Pertinent labs & imaging results that were available during my care of the patient were reviewed by me and considered in my medical decision making (see chart for details).     Pt symptoms consistent with URI. CXR negative for acute infiltrate. Pt will be discharged with symptomatic treatment, including Tessalon and Ocean nasal spray.  Patient also requesting repeat treatment for thrush that she required after antibiotics couple weeks ago.  There does seem to be very little residual thrush on the soft palate.  Diflucan and nystatin initiated, per patient  request.  Discussed return precautions.  Pt is hemodynamically stable & in NAD prior to discharge.  Patient also evaluated by my attending, Dr. Maryan Rued, who guided the patient's management and agrees with plan.   Final Clinical Impressions(s) / ED Diagnoses   Final diagnoses:  Viral URI with cough    ED Discharge Orders         Ordered    benzonatate (TESSALON) 100 MG capsule  Every 8 hours     01/28/18 1752    sodium chloride (OCEAN) 0.65 % SOLN nasal spray  As needed     01/28/18 1752    fluconazole (DIFLUCAN) 100 MG tablet  Daily     01/28/18 1757    nystatin (MYCOSTATIN) 100000 UNIT/ML suspension  4 times daily     01/28/18 1757           Frederica Kuster, PA-C 01/29/18 Roseanne Reno, MD 01/30/18 [phone]

## 2018-01-28 NOTE — ED Triage Notes (Signed)
Pt with flu like sx x 4 days-NAD-steady gait

## 2018-01-28 NOTE — ED Notes (Signed)
NAD at this time. Pt is stable and going home.  

## 2018-01-28 NOTE — Discharge Instructions (Addendum)
Take Tessalon every 8 hours as needed for cough.  Use nasal saline as needed for nasal congestion.  Resume taking nystatin and Diflucan as prescribed for your thrush.  Please follow-up with your doctor in 3 to 4 days for recheck.  Please return to the emergency department if you develop any new or worsening symptoms.

## 2018-01-29 MED FILL — BENZONATATE 100 MG CAP: 100 | 7 days supply | Qty: 21 | Fill #0

## 2018-01-29 MED FILL — DEEP SEA 0.65% NOSE SPRAY: 0.65 | 10 days supply | Qty: 44 | Fill #0

## 2018-01-29 MED FILL — NYSTATIN 100,000 UNITS/ML S: 100000 | 24 days supply | Qty: 473 | Fill #0

## 2018-01-29 MED FILL — FLUCONAZOLE 100 MG TAB: 100 | 7 days supply | Qty: 7 | Fill #0

## 2018-02-01 ENCOUNTER — Encounter (HOSPITAL_BASED_OUTPATIENT_CLINIC_OR_DEPARTMENT_OTHER): Payer: Self-pay | Admitting: Emergency Medicine

## 2018-02-01 ENCOUNTER — Emergency Department (HOSPITAL_BASED_OUTPATIENT_CLINIC_OR_DEPARTMENT_OTHER)
Admission: EM | Admit: 2018-02-01 | Discharge: 2018-02-01 | Disposition: A | Discharge status: Home / Self Care | Payer: Medicare Other | Attending: Emergency Medicine | Admitting: Emergency Medicine

## 2018-02-01 ENCOUNTER — Other Ambulatory Visit: Payer: Self-pay

## 2018-02-01 DIAGNOSIS — R062 Wheezing: Secondary | ICD-10-CM | POA: Diagnosis present

## 2018-02-01 DIAGNOSIS — Z79899 Other long term (current) drug therapy: Secondary | ICD-10-CM | POA: Diagnosis not present

## 2018-02-01 DIAGNOSIS — E119 Type 2 diabetes mellitus without complications: Secondary | ICD-10-CM | POA: Diagnosis not present

## 2018-02-01 DIAGNOSIS — J069 Acute upper respiratory infection, unspecified: Secondary | ICD-10-CM | POA: Insufficient documentation

## 2018-02-01 DIAGNOSIS — R05 Cough: Secondary | ICD-10-CM | POA: Diagnosis not present

## 2018-02-01 DIAGNOSIS — R059 Cough, unspecified: Secondary | ICD-10-CM

## 2018-02-01 NOTE — ED Provider Notes (Signed)
[facility] EMERGENCY DEPARTMENT Provider Note  CSN: [id number] Arrival date & time: 02/01/18 0458  Chief Complaint(s) URI  HPI [name] is a 77 y.o. female who was recently seen in the emergency department and diagnosed with a viral respiratory infection presents to the emergency department with continued symptoms.  Patient reports that she is been hearing wheezing sounds while she lies down.  This been ongoing for couple days but worse today.  She reports that since she was seen here 3 days ago her symptoms have improved except for the wheezing.  She denies any associated fevers or chills.  Denies any nausea or vomiting.  No chest pain.  Denies any other physical complaints.  HPI  Past Medical History Past Medical History:  Diagnosis Date  . Abdominal pain in female 03/18/2010   Qualifier: Diagnosis of  By: Carlean Purl MD, Dimas Millin Anemia 06/08/2014  . Anxiety   . Arthritis    Spinal Osteoarthritis  . Cancer ([place])   . Carcinoid tumor of stomach   . Cataract   . Chest pain    Myoview 12/15 no ischemia.  . Chronic kidney disease    Left kidney smaller than right kidney  . Constipation 11/21/2016  . Diabetes mellitus type 2 in obese ([place]) 09/05/2006   Qualifier: Diagnosis of  By: Marca Ancona RMA, Lucy    . Diabetic peripheral neuropathy ([place]) 10/29/2013  . Diverticulosis 08/30/2000   Colonoscopy   . Encounter for preventative adult health care exam with abnormal findings 09/14/2013  . Esophageal reflux   . Gastric polyp    Fundic Gland  . Gastroparesis   . Headache(784.0)   . Heart murmur    Echocardiogram 2/11: EF 60-65%, mild LAE, grade 1 diastolic dysfunction, aortic valve sclerosis, mean gradient 9 mm of mercury, PASP 34  . Hematuria 03/16/2016  . Iron deficiency anemia, unspecified   . 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 Leg swelling    bilateral  . Neck pain 04/22/2015  . PONV (postoperative nausea and vomiting)    pt states only needs small amount of  anesthesia  . PSVT (paroxysmal supraventricular tachycardia) ([place])   . Pure hypercholesterolemia   . 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 Stroke ([place])    tia, 2014  . 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 Type II or unspecified type diabetes mellitus without mention of complication, not stated as uncontrolled   . Unspecified essential hypertension   . Unspecified hereditary and idiopathic peripheral neuropathy 10/29/2013   Patient Active Problem List   Diagnosis Date Noted  . Oral thrush 12/07/2017  . Sinusitis 10/11/2017  . LLQ abdominal pain 08/03/2017  . Headache 07/12/2017  . Dizzy spells 11/21/2016  . Constipation 11/21/2016  . Nonrheumatic aortic valve stenosis 05/23/2016  . Aortic atherosclerosis ([place]) 05/23/2016  . Hematuria 03/16/2016  . 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 Pain of upper abdomen 06/06/2015  . Neck pain 04/22/2015  . Ear pain 02/28/2015  . Abnormal urine 11/21/2014  . 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 Anemia 06/08/2014  . RLS (restless legs syndrome) 11/23/2013  . Diabetic peripheral neuropathy ([place]) 10/29/2013  . Left-sided thoracic back pain 10/06/2013  . Encounter for preventative adult health care exam with abnormal findings 09/14/2013  . Iron deficiency anemia   . Status post laparoscopy 02/25/2013  . Hyponatremia 01/09/2013  . GERD (gastroesophageal reflux disease) 01/09/2013  . Amaurosis fugax of left eye 10/16/2012  . Low back pain 06/03/2012  . Vitamin D deficiency  03/11/2012  . Bilateral hand pain 10/20/2011  . Encounter for long-term (current) use of other medications 10/20/2011  . IBS (irritable bowel syndrome) 08/14/2011  . TIA (transient ischemic attack) 02/10/2011  . Abnormal brain MRI 01/19/2011  . Allergic rhinitis 10/01/2010  . 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 FUNDIC GLAND POLYPS OF STOMACH 03/18/2010  . Abdominal pain in female 03/18/2010  . ARTHRALGIA 03/17/2009  . SYSTOLIC MURMUR 80/04/4915  . Paroxysmal  supraventricular tachycardia ([place]) 01/12/2009  . PLANTAR FASCIITIS 06/08/2008  . CHEST PAIN 05/18/2008  . Gastroparesis 12/18/2007  . HYPERCHOLESTEROLEMIA 06/11/2007  . Diabetes mellitus type 2 in obese ([place]) 09/05/2006  . Essential hypertension 09/05/2006   Home Medication(s) Prior to Admission medications   Medication Sig Start Date End Date Taking? Authorizing Provider  ALPRAZolam (XANAX) 0.25 MG tablet Take 1 tablet (0.25 mg total) by mouth 2 (two) times daily as needed for anxiety. 06/09/16   [name], MD  amLODipine (NORVASC) 5 MG tablet TAKE 1 TABLET BY MOUTH  DAILY 08/30/17   Minus Breeding, MD  aspirin 81 MG tablet Take 81 mg by mouth daily.    [provider]  benzonatate (TESSALON) 100 MG capsule Take 1 capsule (100 mg total) by mouth every 8 (eight) hours. 01/28/18   Frederica Kuster, PA-C  cholecalciferol (VITAMIN D) 1000 UNITS tablet Take 1,000 Units by mouth daily.      [provider]  dicyclomine (BENTYL) 10 MG capsule TAKE ONE CAPSULE BY MOUTH 4 TIMES DAILY AS NEEDED 07/18/17   Gatha Mayer, MD  esomeprazole (NEXIUM) 40 MG capsule Take 1 capsule (40 mg total) by mouth daily before breakfast. 10/11/17   [name], MD  fluconazole (DIFLUCAN) 100 MG tablet Take 1 tablet (100 mg total) by mouth daily for 7 days. 01/28/18 02/04/18  Law, Bea Graff, PA-C  FOLBIC 2.5-25-2 MG TABS tablet TAKE 1 TABLET BY MOUTH EVERY DAY 10/16/17   [name], MD  glucose blood (ONE TOUCH ULTRA TEST) test strip USE 2 TIMES DAILY FOR DIABETIC TESTING 09/11/17   [name], MD  glucose monitoring kit (FREESTYLE) monitoring kit 1 each by Does not apply route as needed for other. 08/07/17   [name], MD  hydrALAZINE (APRESOLINE) 10 MG tablet Take 1 tablet (10 mg total) by mouth 2 (two) times daily. 11/27/17   [name], MD  losartan (COZAAR) 50 MG tablet Take 1 tablet (50 mg total) by mouth 2 (two) times daily. 10/11/17   [name], MD  metFORMIN  (GLUCOPHAGE-XR) 500 MG 24 hr tablet Take 1 tablet (500 mg total) by mouth 3 (three) times daily with meals. 08/30/17   [name], MD  metoCLOPramide (REGLAN) 5 MG tablet TAKE 1 TABLET BY MOUTH TWO  TIMES DAILY 12/21/17   Gatha Mayer, MD  metoprolol tartrate (LOPRESSOR) 50 MG tablet TAKE 1 AND 1/2 TABLETS BY  MOUTH TWICE DAILY 12/03/17   Minus Breeding, MD  Multiple Vitamins-Calcium (VIACTIV MULTI-VITAMIN) CHEW Chew 2 each by mouth daily.    [provider]  nystatin (MYCOSTATIN) 100000 UNIT/ML suspension Take 5 mLs (500,000 Units total) by mouth 4 (four) times daily. Swish and spit 01/28/18   Law, Bea Graff, PA-C  ondansetron (ZOFRAN) 4 MG tablet Take 4 mg by mouth every 4 (four) hours as needed. For nausea   06/02/10   Renato Shin, MD  [facility] DELICA LANCETS 91T MISC USE 2 TIMES DAILY FOR DIABETIC TESTING 01/28/18  [name], MD  potassium chloride (K-DUR,KLOR-CON) 10 MEQ tablet TAKE 1 TABLET BY MOUTH  DAILY 12/03/17   [name], MD  rosuvastatin (CRESTOR) 10 MG tablet TAKE 1 TABLET BY MOUTH  DAILY 12/03/17   [name], MD  sodium chloride (OCEAN) 0.65 % SOLN nasal spray Place 1 spray into both nostrils as needed for congestion. 01/28/18   Frederica Kuster, PA-C                                                                                                                                    Past Surgical History Past Surgical History:  Procedure Laterality Date  . CHOLECYSTECTOMY  1993  . COLONOSCOPY  11/11/2010   diverticulosis  . DILATATION & CURRETTAGE/HYSTEROSCOPY WITH RESECTOCOPE N/A 02/25/2013   Procedure: Attempted hysteroscopy with uterine perforation;  Surgeon: Jamey Reas de Berton Lan, MD;  Location: [facility];  Service: Gynecology;  Laterality: N/A;  . ESOPHAGOGASTRODUODENOSCOPY  08/29/2010; 09/15/2010   Carcinoid tumor less than 1 cm in July 2012 not seen in August 2012 , gastritis, fundic gland polyps  . ESOPHAGOGASTRODUODENOSCOPY   05/16/2011  . ESOPHAGOGASTRODUODENOSCOPY  06/14/2012  . EUS  12/15/2010   Procedure: UPPER ENDOSCOPIC ULTRASOUND (EUS) LINEAR;  Surgeon: Owens Loffler, MD;  Location: [facility];  Service: Endoscopy;  Laterality: N/A;  . EYE SURGERY Bilateral    Bi lateral cateracts and bi lateral laser  . LAPAROSCOPY N/A 02/25/2013   Procedure: Cystoscopy and laparoscopy with fulguration of uterine serosa;  Surgeon: Jamey Reas de Berton Lan, MD;  Location: [facility];  Service: Gynecology;  Laterality: N/A;  . TONSILLECTOMY     Family History Family History  Problem Relation Age of Onset  . Diabetes Mother   . Stroke Father        deceased age 28  . Heart disease Sister        deceased MI age 16  . Heart disease Brother        deceased MI age 61  . Diabetes Maternal Grandmother   . Hypertension Paternal Grandmother   . Diabetes Sister   . Heart disease Sister   . Hypertension Sister   . Hyperlipidemia Sister   . Diabetes Sister   . Heart disease Sister   . Hypertension Sister   . Hyperlipidemia Sister   . Diabetes Brother   . Heart disease Brother   . Hypertension Brother   . Hyperlipidemia Brother   . Colon cancer Neg Hx   . Esophageal cancer Neg Hx   . Stomach cancer Neg Hx   . Rectal cancer Neg Hx     Social History Social History   Tobacco Use  . Smoking status: Never Smoker  . Smokeless tobacco: Never Used  . Tobacco comment: Never used tobacco  Substance Use Topics  . Alcohol use: No    Alcohol/week: 0.0 standard drinks  . Drug use:  No   Allergies Tramadol  Review of Systems Review of Systems All other systems are reviewed and are negative for acute change except as noted in the HPI  Physical Exam Vital Signs  I have reviewed the triage vital signs BP (!) 172/72 (BP Location: Left Arm)   Pulse 75   Temp 97.8 F (36.6 C) (Oral)   Resp 18   Ht 5' 1"  (1.549 m)   Wt 77.5 kg   LMP 02/07/1992   SpO2 100%   BMI 32.28 kg/m   Physical Exam Vitals signs  reviewed.  Constitutional:      General: She is not in acute distress.    Appearance: She is well-developed. She is not diaphoretic.  HENT:     Head: Normocephalic and atraumatic.     Nose: Nose normal.  Eyes:     General: No scleral icterus.       Right eye: No discharge.        Left eye: No discharge.     Conjunctiva/sclera: Conjunctivae normal.     Pupils: Pupils are equal, round, and reactive to light.  Neck:     Musculoskeletal: Normal range of motion and neck supple.  Cardiovascular:     Rate and Rhythm: Normal rate and regular rhythm.     Heart sounds: No murmur. No friction rub. No gallop.   Pulmonary:     Effort: Pulmonary effort is normal. No respiratory distress.     Breath sounds: No stridor. Examination of the right-middle field reveals rhonchi. Examination of the left-middle field reveals rhonchi. Rhonchi (which cleared with coughing) present. No decreased breath sounds, wheezing or rales.  Abdominal:     General: There is no distension.     Palpations: Abdomen is soft.     Tenderness: There is no abdominal tenderness.  Musculoskeletal:        General: No tenderness.  Skin:    General: Skin is warm and dry.     Findings: No erythema or rash.  Neurological:     Mental Status: She is alert and oriented to person, place, and time.     ED Results and Treatments Labs (all labs ordered are listed, but only abnormal results are displayed) Labs Reviewed - No data to display                                                                                                                       EKG  EKG Interpretation  Date/Time:    Ventricular Rate:    PR Interval:    QRS Duration:   QT Interval:    QTC Calculation:   R Axis:     Text Interpretation:        Radiology No results found. Pertinent labs & imaging results that were available during my care of the patient were reviewed by me and considered in my medical decision making (see chart for  details).  Medications Ordered in ED Medications - No data to display  Procedures Procedures  (including critical care time)  Medical Decision Making / ED Course I have reviewed the nursing notes for this encounter and the patient's prior records (if available in EHR or on provided paperwork).    Patient presents with continued cough in the setting of viral respiratory infection.  On exam I noted mild deep rhonchi in bilateral fields which cleared with coughing.  No focal findings on exam concerning for pneumonia.  Recommended continued symptomatic management and PCP follow-up as needed.  The patient appears reasonably screened and/or stabilized for discharge and I doubt any other medical condition or other [facility] requiring further screening, evaluation, or treatment in the ED at this time prior to discharge.  The patient is safe for discharge with strict return precautions.   Final Clinical Impression(s) / ED Diagnoses Final diagnoses:  Cough    Disposition: Discharge  Condition: Good  I have discussed the results, Dx and Tx plan with the patient who expressed understanding and agree(s) with the plan. Discharge instructions discussed at great length. The patient was given strict return precautions who verbalized understanding of the instructions. No further questions at time of discharge.    ED Discharge Orders    None       Follow Up: [name], MD [place] [address] [phone]   in 5-7 days, If symptoms do not improve or  worsen     This chart was dictated using voice recognition software.  Despite best efforts to proofread,  errors can occur which can change the documentation meaning.   Fatima Blank, MD 02/01/18 [phone]

## 2018-02-01 NOTE — ED Triage Notes (Signed)
Pt c/o cough and congestion. Pt states she has been wheezing all night. Pt was seen here mon for same.

## 2018-02-01 NOTE — ED Notes (Signed)
Patient left at this time with all belongings. 

## 2018-02-19 ENCOUNTER — Other Ambulatory Visit: Payer: Self-pay | Admitting: Family Medicine

## 2018-02-19 DIAGNOSIS — H04123 Dry eye syndrome of bilateral lacrimal glands: Secondary | ICD-10-CM | POA: Diagnosis not present

## 2018-02-19 DIAGNOSIS — E119 Type 2 diabetes mellitus without complications: Secondary | ICD-10-CM | POA: Diagnosis not present

## 2018-02-21 ENCOUNTER — Ambulatory Visit (HOSPITAL_BASED_OUTPATIENT_CLINIC_OR_DEPARTMENT_OTHER)
Admission: RE | Admit: 2018-02-21 | Discharge: 2018-02-21 | Disposition: A | Discharge status: Home / Self Care | Payer: Medicare Other | Source: Ambulatory Visit | Attending: Family Medicine | Admitting: Family Medicine

## 2018-02-21 DIAGNOSIS — Z1231 Encounter for screening mammogram for malignant neoplasm of breast: Secondary | ICD-10-CM | POA: Insufficient documentation

## 2018-02-25 ENCOUNTER — Other Ambulatory Visit (INDEPENDENT_AMBULATORY_CARE_PROVIDER_SITE_OTHER): Payer: Medicare Other

## 2018-02-25 DIAGNOSIS — E782 Mixed hyperlipidemia: Secondary | ICD-10-CM | POA: Diagnosis not present

## 2018-02-25 DIAGNOSIS — E559 Vitamin D deficiency, unspecified: Secondary | ICD-10-CM | POA: Diagnosis not present

## 2018-02-25 DIAGNOSIS — E669 Obesity, unspecified: Secondary | ICD-10-CM

## 2018-02-25 DIAGNOSIS — E1169 Type 2 diabetes mellitus with other specified complication: Secondary | ICD-10-CM | POA: Diagnosis not present

## 2018-02-25 LAB — COMPREHENSIVE METABOLIC PANEL
ALBUMIN: 4.3 g/dL (ref 3.5–5.2)
ALT: 22 U/L (ref 0–35)
AST: 24 U/L (ref 0–37)
Alkaline Phosphatase: 54 U/L (ref 39–117)
BUN: 15 mg/dL (ref 6–23)
CALCIUM: 10.1 mg/dL (ref 8.4–10.5)
CHLORIDE: 101 meq/L (ref 96–112)
CO2: 27 meq/L (ref 19–32)
CREATININE: 0.7 mg/dL (ref 0.40–1.20)
GFR: 81.16 mL/min (ref >60.00)
Glucose, Bld: 124 mg/dL — ABNORMAL HIGH (ref 70–99)
POTASSIUM: 3.8 meq/L (ref 3.5–5.1)
SODIUM: 138 meq/L (ref 135–145)
Total Bilirubin: 0.7 mg/dL (ref 0.2–1.2)
Total Protein: 6.9 g/dL (ref 6.0–8.3)

## 2018-02-25 LAB — LIPID PANEL
CHOL/HDL RATIO: 3
CHOLESTEROL: 199 mg/dL (ref 0–200)
HDL: 67.7 mg/dL (ref >39.00)
LDL CALC: 111 mg/dL — AB (ref 0–99)
NonHDL: 131.43
TRIGLYCERIDES: 102 mg/dL (ref 0.0–149.0)
VLDL: 20.4 mg/dL (ref 0.0–40.0)

## 2018-02-25 LAB — CBC
HEMATOCRIT: 37.8 % (ref 36.0–46.0)
Hemoglobin: 12.9 g/dL (ref 12.0–15.0)
MCHC: 34.1 g/dL (ref 30.0–36.0)
MCV: 86.7 fl (ref 78.0–100.0)
Platelets: 216 10*3/uL (ref 150.0–400.0)
RBC: 4.36 Mil/uL (ref 3.87–5.11)
RDW: 12.9 % (ref 11.5–15.5)
WBC: 6.9 10*3/uL (ref 4.0–10.5)

## 2018-02-25 LAB — HEMOGLOBIN A1C: HEMOGLOBIN A1C: 6.4 % (ref 4.6–6.5)

## 2018-02-25 LAB — VITAMIN D 25 HYDROXY (VIT D DEFICIENCY, FRACTURES): VITD: 52.32 ng/mL (ref 30.00–100.00)

## 2018-02-25 LAB — TSH: TSH: 2.55 u[IU]/mL (ref 0.35–4.50)

## 2018-02-28 ENCOUNTER — Ambulatory Visit (INDEPENDENT_AMBULATORY_CARE_PROVIDER_SITE_OTHER): Payer: Medicare Other | Admitting: Family Medicine

## 2018-02-28 VITALS — BP 132/60 | HR 65 | Temp 98.2°F | Resp 18 | Wt 172.2 lb

## 2018-02-28 DIAGNOSIS — E559 Vitamin D deficiency, unspecified: Secondary | ICD-10-CM | POA: Diagnosis not present

## 2018-02-28 DIAGNOSIS — R51 Headache: Secondary | ICD-10-CM

## 2018-02-28 DIAGNOSIS — K3184 Gastroparesis: Secondary | ICD-10-CM

## 2018-02-28 DIAGNOSIS — E669 Obesity, unspecified: Secondary | ICD-10-CM

## 2018-02-28 DIAGNOSIS — R220 Localized swelling, mass and lump, head: Secondary | ICD-10-CM | POA: Diagnosis not present

## 2018-02-28 DIAGNOSIS — K1370 Unspecified lesions of oral mucosa: Secondary | ICD-10-CM

## 2018-02-28 DIAGNOSIS — E1169 Type 2 diabetes mellitus with other specified complication: Secondary | ICD-10-CM

## 2018-02-28 DIAGNOSIS — H9202 Otalgia, left ear: Secondary | ICD-10-CM | POA: Diagnosis not present

## 2018-02-28 DIAGNOSIS — Z79899 Other long term (current) drug therapy: Secondary | ICD-10-CM | POA: Diagnosis not present

## 2018-02-28 DIAGNOSIS — E7800 Pure hypercholesterolemia, unspecified: Secondary | ICD-10-CM

## 2018-02-28 DIAGNOSIS — R519 Headache, unspecified: Secondary | ICD-10-CM

## 2018-02-28 DIAGNOSIS — B370 Candidal stomatitis: Secondary | ICD-10-CM

## 2018-02-28 NOTE — Patient Instructions (Addendum)
Increase Benefiber to twice daily and add a daily probiotic such as Phillip's colon health or Culturelle dailythrush  Oral Thrush, Adult  Oral thrush, also called oral candidiasis, is a fungal infection that develops in the mouth and throat and on the tongue. It causes white patches to form on the mouth and tongue. [name] is most common in older adults, but it can occur at any age. Many cases of thrush are mild, but this infection can also be serious. [name] can be a repeated (recurrent) problem for certain people who have a weak body defense system (immune system). The weakness can be caused by chronic illnesses, or by taking medicines that limit the body's ability to fight infection. If a person has difficulty fighting infection, the fungus that causes thrush can spread through the body. This can cause life-threatening blood or organ infections. What are the causes? This condition is caused by a fungus (yeast) called Candida albicans.  This fungus is normally present in small amounts in the mouth and on other mucous membranes. It usually causes no harm.  If conditions are present that allow the fungus to grow without control, it invades surrounding tissues and becomes an infection.  Other Candida species can also lead to thrush (rare). What increases the risk? This condition is more likely to develop in:  People with a weakened immune system.  Older adults.  People with HIV (human immunodeficiency virus).  People with diabetes.  People with dry mouth (xerostomia).  Pregnant women.  People with poor dental care, especially people who have false teeth.  People who use antibiotic medicines. What are the signs or symptoms? Symptoms of this condition can vary from mild and moderate to severe and persistent. Symptoms may include:  A burning feeling in the mouth and throat. This can occur at the start of a thrush infection.  White patches that stick to the mouth and tongue. The tissue  around the patches may be red, raw, and painful. If rubbed (during tooth brushing, for example), the patches and the tissue of the mouth may bleed easily.  A bad taste in the mouth or difficulty tasting foods.  A cottony feeling in the mouth.  Pain during eating and swallowing.  Poor appetite.  Cracking at the corners of the mouth. How is this diagnosed? This condition is diagnosed based on:  Physical exam. Your health care provider will look in your mouth.  Health history. Your health care provider will ask you questions about your health. How is this treated? This condition is treated with medicines called antifungals, which prevent the growth of fungi. These medicines are either applied directly to the affected area (topical) or swallowed (oral). The treatment will depend on the severity of the condition. Mild thrush Mild cases of thrush may clear up with the use of an antifungal mouth rinse or lozenges. Treatment usually lasts about 14 days. Moderate to severe thrush  More severe thrush infections that have spread to the esophagus are treated with an oral antifungal medicine. A topical antifungal medicine may also be used.  For some severe infections, treatment may need to continue for more than 14 days.  Oral antifungal medicines are rarely used during pregnancy because they may be harmful to the unborn child. If you are pregnant, talk with your health care provider about options for treatment. Persistent or recurrent thrush For cases of thrush that do not go away or keep coming back:  Treatment may be needed twice as long as the symptoms last.  Treatment will include both oral and topical antifungal medicines.  People with a weakened immune system can take an antifungal medicine on a continuous basis to prevent thrush infections. It is important to treat conditions that make a person more likely to get thrush, such as diabetes or HIV. Follow these instructions at  home: Medicines  Take over-the-counter and prescription medicines only as told by your health care provider.  Talk with your health care provider about an over-the-counter medicine called gentian violet, which kills bacteria and fungi. Relieving soreness and discomfort To help reduce the discomfort of thrush:  Drink cold liquids such as water or iced tea.  Try flavored ice treats or frozen juices.  Eat foods that are easy to swallow, such as gelatin, ice cream, or custard.  Try drinking from a straw if the patches in your mouth are painful.  General instructions  Eat plain, unflavored yogurt as directed by your health care provider. Check the label to make sure the yogurt contains live cultures. This yogurt can help healthy bacteria to grow in the mouth and can stop the growth of the fungus that causes thrush.  If you wear dentures, remove the dentures before going to bed, brush them vigorously, and soak them in a cleaning solution as directed by your health care provider.  Rinse your mouth with a warm salt-water mixture several times a day. To make a salt-water mixture, completely dissolve 1/2-1 tsp of salt in 1 cup of warm water. Contact a health care provider if:  Your symptoms are getting worse or are not improving within 7 days of starting treatment.  You have symptoms of a spreading infection, such as white patches on the skin outside of the mouth. This information is not intended to replace advice given to you by your health care provider. Make sure you discuss any questions you have with your health care provider. Document Released: 10/19/2003 Document Revised: 10/18/2015 Document Reviewed: 10/18/2015 Elsevier Interactive Patient Education  [employer].

## 2018-03-03 NOTE — Assessment & Plan Note (Signed)
She has recently been treated for an URI but her left ear pain and congestion and dry, sore throat persist. On exam she has swelling anterior to the left tonsillar pillar. Will proceed with CT scan to further investigate.

## 2018-03-03 NOTE — Assessment & Plan Note (Signed)
Following with [facility] and better since starting Reglan

## 2018-03-03 NOTE — Assessment & Plan Note (Signed)
hgba1c acceptable, minimize simple carbs. Increase exercise as tolerated. Continue current meds 

## 2018-03-03 NOTE — Assessment & Plan Note (Signed)
Supplement and monitor 

## 2018-03-03 NOTE — Assessment & Plan Note (Signed)
Recently treated and feeling better.

## 2018-03-03 NOTE — Progress Notes (Signed)
Subjective:    [name], female    DOB: 01-01-1941, 78 y.o.   MRN: [id number]  No chief complaint on file.   HPI Patient is in today for follow-up.  She has been ill recently with respiratory symptoms, head congestion, malaise, sore throat.  She was treated and felt her respiratory symptoms were improving but then she developed thrush.  Her sore throat got worse headache got worse.  She was treated for that and she improved but now is noting right ear pain dry throat sore neck malaise and anxiety.  No recent hospitalization. Denies CP/palp/SOB/fevers/GI or GU c/o. Taking meds as prescribed  Past Medical History:  Diagnosis Date  . Abdominal pain in female 03/18/2010   Qualifier: Diagnosis of  By: Carlean Purl MD, Dimas Millin Anemia 06/08/2014  . Anxiety   . Arthritis    Spinal Osteoarthritis  . Cancer ([place])   . Carcinoid tumor of stomach   . Cataract   . Chest pain    Myoview 12/15 no ischemia.  . Chronic kidney disease    Left kidney smaller than right kidney  . Constipation 11/21/2016  . Diabetes mellitus type 2 in obese ([place]) 09/05/2006   Qualifier: Diagnosis of  By: Marca Ancona RMA, Lucy    . Diabetic peripheral neuropathy ([place]) 10/29/2013  . Diverticulosis 08/30/2000   Colonoscopy   . Encounter for preventative adult health care exam with abnormal findings 09/14/2013  . Esophageal reflux   . Gastric polyp    Fundic Gland  . Gastroparesis   . Headache(784.0)   . Heart murmur    Echocardiogram 2/11: EF 60-65%, mild LAE, grade 1 diastolic dysfunction, aortic valve sclerosis, mean gradient 9 mm of mercury, PASP 34  . Hematuria 03/16/2016  . Iron deficiency anemia, unspecified   . 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 Leg swelling    bilateral  . Neck pain 04/22/2015  . PONV (postoperative nausea and vomiting)    pt states only needs small amount of anesthesia  . PSVT (paroxysmal supraventricular tachycardia) ([place])   . Pure hypercholesterolemia   . Recurrent UTI  01/11/2016  . Stroke ([place])    tia, 2014  . 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 Type II or unspecified type diabetes mellitus without mention of complication, not stated as uncontrolled   . Unspecified essential hypertension   . Unspecified hereditary and idiopathic peripheral neuropathy 10/29/2013    Past Surgical History:  Procedure Laterality Date  . CHOLECYSTECTOMY  1993  . COLONOSCOPY  11/11/2010   diverticulosis  . DILATATION & CURRETTAGE/HYSTEROSCOPY WITH RESECTOCOPE N/A 02/25/2013   Procedure: Attempted hysteroscopy with uterine perforation;  Surgeon: Jamey Reas de Berton Lan, MD;  Location: [facility];  Service: Gynecology;  Laterality: N/A;  . ESOPHAGOGASTRODUODENOSCOPY  08/29/2010; 09/15/2010   Carcinoid tumor less than 1 cm in July 2012 not seen in August 2012 , gastritis, fundic gland polyps  . ESOPHAGOGASTRODUODENOSCOPY  05/16/2011  . ESOPHAGOGASTRODUODENOSCOPY  06/14/2012  . EUS  12/15/2010   Procedure: UPPER ENDOSCOPIC ULTRASOUND (EUS) LINEAR;  Surgeon: Owens Loffler, MD;  Location: [facility];  Service: Endoscopy;  Laterality: N/A;  . EYE SURGERY Bilateral    Bi lateral cateracts and bi lateral laser  . LAPAROSCOPY N/A 02/25/2013   Procedure: Cystoscopy and laparoscopy with fulguration of uterine serosa;  Surgeon: Jamey Reas de Berton Lan, MD;  Location: [facility];  Service: Gynecology;  Laterality: N/A;  . TONSILLECTOMY      Family History  Problem Relation Age of Onset  . Diabetes Mother   . Stroke Father        deceased age 78  . Heart disease Sister        deceased MI age 13  . Heart disease Brother        deceased MI age 51  . Diabetes Maternal Grandmother   . Hypertension Paternal Grandmother   . Diabetes Sister   . Heart disease Sister   . Hypertension Sister   . Hyperlipidemia Sister   . Diabetes Sister   . Heart disease Sister   . Hypertension Sister   . Hyperlipidemia Sister   . Diabetes Brother   . Heart disease Brother   . Hypertension Brother    . Hyperlipidemia Brother   . Colon cancer Neg Hx   . Esophageal cancer Neg Hx   . Stomach cancer Neg Hx   . Rectal cancer Neg Hx     Social History   Socioeconomic History  . Marital status: Widowed    Spouse name: Not on file  . Number of children: 0  . Years of education: college  . Highest education level: Not on file  Occupational History  . Occupation: retired  Scientific laboratory technician  . Financial resource strain: Not on file  . Food insecurity:    Worry: Not on file    Inability: Not on file  . Transportation needs:    Medical: Not on file    Non-medical: Not on file  Tobacco Use  . Smoking status: Never Smoker  . Smokeless tobacco: Never Used  . Tobacco comment: Never used tobacco  Substance and Sexual Activity  . Alcohol use: No    Alcohol/week: 0.0 standard drinks  . Drug use: No  . Sexual activity: Never    Partners: Male    Birth control/protection: Post-menopausal    Comment: lives alone, no dietary restrictions except avoid fresh veg, fruit, whole grains  Lifestyle  . Physical activity:    Days per week: Not on file    Minutes per session: Not on file  . Stress: Not on file  Relationships  . Social connections:    Talks on phone: Not on file    Gets together: Not on file    Attends religious service: Not on file    Active member of club or organization: Not on file    Attends meetings of clubs or organizations: Not on file    Relationship status: Not on file  . Intimate partner violence:    Fear of current or ex partner: Not on file    Emotionally abused: Not on file    Physically abused: Not on file    Forced sexual activity: Not on file  Other Topics Concern  . Not on file  Social History Narrative   Patient was married ([name]) - widow   Patient does not have any children.   Patient is right-handed.   Patient has a BA degree.   One caffeine drink daily     Outpatient Medications Prior to Visit  Medication Sig Dispense Refill  . amLODipine  (NORVASC) 5 MG tablet TAKE 1 TABLET BY MOUTH  DAILY 90 tablet 3  . aspirin 81 MG tablet Take 81 mg by mouth daily.    . cholecalciferol (VITAMIN D) 1000 UNITS tablet Take 1,000 Units by mouth daily.      Marland Kitchen dicyclomine (BENTYL) 10 MG capsule TAKE ONE CAPSULE BY MOUTH 4 TIMES DAILY AS NEEDED 120 capsule 1  .  esomeprazole (NEXIUM) 40 MG capsule Take 1 capsule (40 mg total) by mouth daily before breakfast. 90 capsule 6  . FOLBIC 2.5-25-2 MG TABS tablet TAKE 1 TABLET BY MOUTH EVERY DAY 90 tablet 1  . glucose blood (ONE TOUCH ULTRA TEST) test strip USE 2 TIMES DAILY FOR DIABETIC TESTING 700 each 2  . hydrALAZINE (APRESOLINE) 10 MG tablet Take 1 tablet (10 mg total) by mouth 2 (two) times daily. 60 tablet 3  . losartan (COZAAR) 50 MG tablet TAKE 1 TABLET BY MOUTH TWO  TIMES DAILY 180 tablet 1  . metFORMIN (GLUCOPHAGE-XR) 500 MG 24 hr tablet Take 1 tablet (500 mg total) by mouth 3 (three) times daily with meals. 270 tablet 1  . metoCLOPramide (REGLAN) 5 MG tablet TAKE 1 TABLET BY MOUTH TWO  TIMES DAILY 180 tablet 0  . metoprolol tartrate (LOPRESSOR) 50 MG tablet TAKE 1 AND 1/2 TABLETS BY  MOUTH TWICE DAILY 270 tablet 1  . Multiple Vitamins-Calcium (VIACTIV MULTI-VITAMIN) CHEW Chew 2 each by mouth daily.    . ondansetron (ZOFRAN) 4 MG tablet Take 4 mg by mouth every 4 (four) hours as needed. For nausea      . ONETOUCH DELICA LANCETS 66Z MISC USE 2 TIMES DAILY FOR DIABETIC TESTING 100 each 1  . potassium chloride (K-DUR,KLOR-CON) 10 MEQ tablet TAKE 1 TABLET BY MOUTH  DAILY 90 tablet 2  . rosuvastatin (CRESTOR) 10 MG tablet TAKE 1 TABLET BY MOUTH  DAILY 90 tablet 2  . sodium chloride (OCEAN) 0.65 % SOLN nasal spray Place 1 spray into both nostrils as needed for congestion. 15 mL 0  . ALPRAZolam (XANAX) 0.25 MG tablet Take 1 tablet (0.25 mg total) by mouth 2 (two) times daily as needed for anxiety. 30 tablet 1  . nystatin (MYCOSTATIN) 100000 UNIT/ML suspension Take 5 mLs (500,000 Units total) by mouth 4  (four) times daily. Swish and spit 473 mL 0  . benzonatate (TESSALON) 100 MG capsule Take 1 capsule (100 mg total) by mouth every 8 (eight) hours. 21 capsule 0  . glucose monitoring kit (FREESTYLE) monitoring kit 1 each by Does not apply route as needed for other. 1 each 0   No facility-administered medications prior to visit.     Allergies  Allergen Reactions  . Tramadol Other (See Comments)    Dizziness     Review of Systems  Constitutional: Positive for malaise/fatigue. Negative for fever.  HENT: Positive for congestion, ear pain, sinus pain and sore throat.   Eyes: Negative for blurred vision.  Respiratory: Negative for shortness of breath.   Cardiovascular: Negative for chest pain, palpitations and leg swelling.  Gastrointestinal: Negative for abdominal pain, blood in stool and nausea.  Genitourinary: Negative for dysuria and frequency.  Musculoskeletal: Negative for falls.  Skin: Negative for rash.  Neurological: Negative for dizziness, loss of consciousness and headaches.  Endo/Heme/Allergies: Negative for environmental allergies.  Psychiatric/Behavioral: Negative for depression. The patient is nervous/anxious.        Objective:    Physical Exam Vitals signs and nursing note reviewed.  Constitutional:      General: She is not in acute distress.    Appearance: She is well-developed.  HENT:     Head: Normocephalic and atraumatic.     Comments: L TM dull and mildly symptomatic. Swelling anterior to left tonsillar pillar.     Right Ear: Tympanic membrane and ear canal normal.     Left Ear: Ear canal normal.     Nose: Nose normal.  Eyes:     General:        Right eye: No discharge.        Left eye: No discharge.  Neck:     Musculoskeletal: Normal range of motion and neck supple.  Cardiovascular:     Rate and Rhythm: Normal rate and regular rhythm.     Heart sounds: No murmur.  Pulmonary:     Effort: Pulmonary effort is normal.     Breath sounds: Normal breath  sounds.  Abdominal:     General: Bowel sounds are normal.     Palpations: Abdomen is soft.     Tenderness: There is no abdominal tenderness.  Skin:    General: Skin is warm and dry.  Neurological:     Mental Status: She is alert and oriented to person, place, and time.     BP 132/60 (BP Location: Left Arm, Patient Position: Sitting, Cuff Size: Normal)   Pulse 65   Temp 98.2 F (36.8 C) (Oral)   Resp 18   Wt 172 lb 3.2 oz (78.1 kg)   LMP 02/07/1992   SpO2 98%   BMI 32.54 kg/m  Wt Readings from Last 3 Encounters:  02/28/18 172 lb 3.2 oz (78.1 kg)  02/01/18 170 lb 13.7 oz (77.5 kg)  01/28/18 171 lb (77.6 kg)     Lab Results  Component Value Date   WBC 6.9 02/25/2018   HGB 12.9 02/25/2018   HCT 37.8 02/25/2018   PLT 216.0 02/25/2018   GLUCOSE 124 (H) 02/25/2018   CHOL 199 02/25/2018   TRIG 102.0 02/25/2018   HDL 67.70 02/25/2018   LDLDIRECT 123.7 04/13/2011   LDLCALC 111 (H) 02/25/2018   ALT 22 02/25/2018   AST 24 02/25/2018   NA 138 02/25/2018   K 3.8 02/25/2018   CL 101 02/25/2018   CREATININE 0.70 02/25/2018   BUN 15 02/25/2018   CO2 27 02/25/2018   TSH 2.55 02/25/2018   INR 0.88 01/16/2011   HGBA1C 6.4 02/25/2018   MICROALBUR 1.6 11/13/2014    Lab Results  Component Value Date   TSH 2.55 02/25/2018   Lab Results  Component Value Date   WBC 6.9 02/25/2018   HGB 12.9 02/25/2018   HCT 37.8 02/25/2018   MCV 86.7 02/25/2018   PLT 216.0 02/25/2018   Lab Results  Component Value Date   NA 138 02/25/2018   K 3.8 02/25/2018   CHLORIDE 101 07/08/2015   CO2 27 02/25/2018   GLUCOSE 124 (H) 02/25/2018   BUN 15 02/25/2018   CREATININE 0.70 02/25/2018   BILITOT 0.7 02/25/2018   ALKPHOS 54 02/25/2018   AST 24 02/25/2018   ALT 22 02/25/2018   PROT 6.9 02/25/2018   ALBUMIN 4.3 02/25/2018   CALCIUM 10.1 02/25/2018   ANIONGAP 9 12/26/2017   EGFR 75 (L) 07/08/2015   GFR 81.16 02/25/2018   Lab Results  Component Value Date   CHOL 199 02/25/2018    Lab Results  Component Value Date   HDL 67.70 02/25/2018   Lab Results  Component Value Date   LDLCALC 111 (H) 02/25/2018   Lab Results  Component Value Date   TRIG 102.0 02/25/2018   Lab Results  Component Value Date   CHOLHDL 3 02/25/2018   Lab Results  Component Value Date   HGBA1C 6.4 02/25/2018       Assessment & Plan:   Problem List Items Addressed This Visit    Diabetes mellitus type 2 in obese ([place]) (Chronic)  hgba1c acceptable, minimize simple carbs. Increase exercise as tolerated. Continue current meds      HYPERCHOLESTEROLEMIA    Tolerating statin, encouraged heart healthy diet, avoid trans fats, minimize simple carbs and saturated fats. Increase exercise as tolerated      Gastroparesis    Following with [facility] and better since starting Reglan      Vitamin D deficiency    Supplement and monitor      Headache    She has recently been treated for an URI but her left ear pain and congestion and dry, sore throat persist. On exam she has swelling anterior to the left tonsillar pillar. Will proceed with CT scan to further investigate.       Oral thrush    Recently treated and feeling better.        Other Visit Diagnoses    High risk medication use    -  Primary   Localized swelling, mass, and lump of head       Relevant Orders   CT Maxillofacial W/Cm   Oral mucosal lesion       Relevant Orders   CT Maxillofacial W/Cm   Ear pain, left       Relevant Orders   CT Maxillofacial W/Cm      I have discontinued Lurae N. Hopes's ALPRAZolam, glucose monitoring kit, benzonatate, and nystatin. I am also having her maintain her cholecalciferol, ondansetron, aspirin, VIACTIV MULTI-VITAMIN, dicyclomine, metFORMIN, amLODipine, glucose blood, esomeprazole, FOLBIC, hydrALAZINE, rosuvastatin, metoprolol tartrate, potassium chloride, metoCLOPramide, ONETOUCH DELICA LANCETS 10Y, sodium chloride, and losartan.  No orders of the defined types were placed in  this encounter.    Penni Homans, MD

## 2018-03-03 NOTE — Assessment & Plan Note (Signed)
Tolerating statin, encouraged heart healthy diet, avoid trans fats, minimize simple carbs and saturated fats. Increase exercise as tolerated 

## 2018-03-13 ENCOUNTER — Ambulatory Visit (HOSPITAL_BASED_OUTPATIENT_CLINIC_OR_DEPARTMENT_OTHER): Admission: RE | Admit: 2018-03-13 | Payer: Medicare Other | Source: Ambulatory Visit

## 2018-03-20 ENCOUNTER — Encounter: Payer: Self-pay | Admitting: Medical

## 2018-03-20 ENCOUNTER — Ambulatory Visit (INDEPENDENT_AMBULATORY_CARE_PROVIDER_SITE_OTHER): Payer: Medicare Other | Admitting: Medical

## 2018-03-20 VITALS — BP 149/64 | HR 62 | Temp 98.2°F | Resp 16 | Ht 61.0 in | Wt 173.8 lb

## 2018-03-20 DIAGNOSIS — H9202 Otalgia, left ear: Secondary | ICD-10-CM

## 2018-03-20 DIAGNOSIS — R5383 Other fatigue: Secondary | ICD-10-CM

## 2018-03-20 DIAGNOSIS — K146 Glossodynia: Secondary | ICD-10-CM

## 2018-03-20 DIAGNOSIS — J3489 Other specified disorders of nose and nasal sinuses: Secondary | ICD-10-CM | POA: Diagnosis not present

## 2018-03-20 DIAGNOSIS — R591 Generalized enlarged lymph nodes: Secondary | ICD-10-CM

## 2018-03-20 LAB — FOLATE: Folate: >24 ng/mL (ref >5.9)

## 2018-03-20 LAB — CBC WITH DIFFERENTIAL/PLATELET
BASOS PCT: 0.7 % (ref 0.0–3.0)
Basophils Absolute: 0.1 10*3/uL (ref 0.0–0.1)
EOS PCT: 1.3 % (ref 0.0–5.0)
Eosinophils Absolute: 0.1 10*3/uL (ref 0.0–0.7)
HCT: 39.2 % (ref 36.0–46.0)
HEMOGLOBIN: 13.2 g/dL (ref 12.0–15.0)
LYMPHS ABS: 2.7 10*3/uL (ref 0.7–4.0)
Lymphocytes Relative: 27.7 % (ref 12.0–46.0)
MCHC: 33.7 g/dL (ref 30.0–36.0)
MCV: 87.6 fl (ref 78.0–100.0)
MONOS PCT: 8.9 % (ref 3.0–12.0)
Monocytes Absolute: 0.9 10*3/uL (ref 0.1–1.0)
Neutro Abs: 6 10*3/uL (ref 1.4–7.7)
Neutrophils Relative %: 61.4 % (ref 43.0–77.0)
Platelets: 216 10*3/uL (ref 150.0–400.0)
RBC: 4.47 Mil/uL (ref 3.87–5.11)
RDW: 13 % (ref 11.5–15.5)
WBC: 9.7 10*3/uL (ref 4.0–10.5)

## 2018-03-20 LAB — VITAMIN B12: Vitamin B-12: >1525 pg/mL — ABNORMAL HIGH (ref 211–911)

## 2018-03-20 MED ORDER — MAGIC MOUTHWASH: ORAL | 0 refills | Status: DC

## 2018-03-20 MED ORDER — FLUCONAZOLE 150 MG PO TABS: 150.0000 mg | ORAL_TABLET | Freq: Once | ORAL | 0 refills | Status: AC

## 2018-03-20 MED FILL — FLUCONAZOLE 150 MG TABS: 150 | 1 days supply | Qty: 1 | Fill #0

## 2018-03-20 MED FILL — CMPD MMW LID/MAALOX/DIPHEN: 10 days supply | Qty: 200 | Fill #0

## 2018-03-20 NOTE — Patient Instructions (Addendum)
You do have history of left ear region pain, burning tongue sensation, history of thrush and some sinus region pain.  Treatment with antibiotic and prednisone did not relieve symptoms but may have caused thrush.  By discussion with you and review of your chart it appears that your PCP had ordered a CT of your sinuses to determine if your symptoms are associated with potential sinus disease/infection.  I would recommend that you go downstairs today and see if you can get that scheduled.  Faint whitish coloration to left side bucca mucosa near her molars.  With your history of thrush in the past, I did prescribe you Diflucan 1 tablet for 1 day.  With your history of burning tongue sensation, I do want to expand work-up and get B12, B1 and folate lab.  Also prescribe you Magic mouthwash.  Rx advisement given.  Also with your described tenderness to left submandibular lymph node, I did place order to get a CBC.  Will send this note to your PCP to review and get opinion if she thinks ENT referral indicated as he did question possible ENT referral.  Follow-up 7 to 10 days or as needed.

## 2018-03-20 NOTE — Progress Notes (Signed)
Subjective:    [name], female    DOB: 09-05-1940, 78 y.o.   MRN: [id number]  HPI  Pt in for some left submandibular area mild tenderness. Pt states pain comes and goes.She gets some occasional ear pain in past. Pt in past was given antibiotic and steroid then she got thrush. Pt states maybe sept or October when she took antibiotic and steroid. Then she got treated thrush medication diflucan and nystatin.  She also mentions history of on an burning sensation to tongue intermittent since tx for thrush. But symptoms of tongue not as severe.  Pt had CT of sinus ordered by her pcp.   Pt does have some sharp pain that shoot down her face from her ears at times. Her left eye sometimes gets watery easily. Pt does not report any obvious sharp pain to her face with cold air. Then later she states does have sharp pain at time to side of face when exposed to cold air.     Review of Systems  Constitutional: Negative for chills, fatigue and fever.  HENT: Positive for ear pain, sinus pressure and sinus pain. Negative for congestion.        Tongue pain  Respiratory: Negative for cough, chest tightness, shortness of breath and wheezing.   Cardiovascular: Negative for chest pain and palpitations.  Gastrointestinal: Negative for abdominal pain.  Musculoskeletal: Negative for back pain.  Skin: Negative for wound.  Neurological: Negative for dizziness, speech difficulty and headaches.  Hematological: Negative for adenopathy. Does not bruise/bleed easily.  Psychiatric/Behavioral: Negative for sleep disturbance. The patient is not nervous/anxious.    Past Medical History:  Diagnosis Date  . Abdominal pain in female 03/18/2010   Qualifier: Diagnosis of  By: Carlean Purl MD, Dimas Millin Anemia 06/08/2014  . Anxiety   . Arthritis    Spinal Osteoarthritis  . Cancer ([place])   . Carcinoid tumor of stomach   . Cataract   . Chest pain    Myoview 12/15 no ischemia.  . Chronic kidney  disease    Left kidney smaller than right kidney  . Constipation 11/21/2016  . Diabetes mellitus type 2 in obese ([place]) 09/05/2006   Qualifier: Diagnosis of  By: Marca Ancona RMA, Lucy    . Diabetic peripheral neuropathy ([place]) 10/29/2013  . Diverticulosis 08/30/2000   Colonoscopy   . Encounter for preventative adult health care exam with abnormal findings 09/14/2013  . Esophageal reflux   . Gastric polyp    Fundic Gland  . Gastroparesis   . Headache(784.0)   . Heart murmur    Echocardiogram 2/11: EF 60-65%, mild LAE, grade 1 diastolic dysfunction, aortic valve sclerosis, mean gradient 9 mm of mercury, PASP 34  . Hematuria 03/16/2016  . Iron deficiency anemia, unspecified   . 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 Leg swelling    bilateral  . Neck pain 04/22/2015  . PONV (postoperative nausea and vomiting)    pt states only needs small amount of anesthesia  . PSVT (paroxysmal supraventricular tachycardia) ([place])   . Pure hypercholesterolemia   . 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 Stroke ([place])    tia, 2014  . 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 Type II or unspecified type diabetes mellitus without mention of complication, not stated as uncontrolled   . Unspecified essential hypertension   . Unspecified hereditary and idiopathic peripheral neuropathy 10/29/2013     Social History   Socioeconomic History  . Marital status: Widowed    Spouse name:  Not on file  . Number of children: 0  . Years of education: college  . Highest education level: Not on file  Occupational History  . Occupation: retired  Scientific laboratory technician  . Financial resource strain: Not on file  . Food insecurity:    Worry: Not on file    Inability: Not on file  . Transportation needs:    Medical: Not on file    Non-medical: Not on file  Tobacco Use  . Smoking status: Never Smoker  . Smokeless tobacco: Never Used  . Tobacco comment: Never used tobacco  Substance and Sexual Activity  . Alcohol use: No    Alcohol/week: 0.0 standard drinks  . Drug  use: No  . Sexual activity: Never    Partners: Male    Birth control/protection: Post-menopausal    Comment: lives alone, no dietary restrictions except avoid fresh veg, fruit, whole grains  Lifestyle  . Physical activity:    Days per week: Not on file    Minutes per session: Not on file  . Stress: Not on file  Relationships  . Social connections:    Talks on phone: Not on file    Gets together: Not on file    Attends religious service: Not on file    Active member of club or organization: Not on file    Attends meetings of clubs or organizations: Not on file    Relationship status: Not on file  . Intimate partner violence:    Fear of current or ex partner: Not on file    Emotionally abused: Not on file    Physically abused: Not on file    Forced sexual activity: Not on file  Other Topics Concern  . Not on file  Social History Narrative   Patient was married ([name]) - widow   Patient does not have any children.   Patient is right-handed.   Patient has a BA degree.   One caffeine drink daily     Past Surgical History:  Procedure Laterality Date  . CHOLECYSTECTOMY  1993  . COLONOSCOPY  11/11/2010   diverticulosis  . DILATATION & CURRETTAGE/HYSTEROSCOPY WITH RESECTOCOPE N/A 02/25/2013   Procedure: Attempted hysteroscopy with uterine perforation;  Surgeon: Jamey Reas de Berton Lan, MD;  Location: [facility];  Service: Gynecology;  Laterality: N/A;  . ESOPHAGOGASTRODUODENOSCOPY  08/29/2010; 09/15/2010   Carcinoid tumor less than 1 cm in July 2012 not seen in August 2012 , gastritis, fundic gland polyps  . ESOPHAGOGASTRODUODENOSCOPY  05/16/2011  . ESOPHAGOGASTRODUODENOSCOPY  06/14/2012  . EUS  12/15/2010   Procedure: UPPER ENDOSCOPIC ULTRASOUND (EUS) LINEAR;  Surgeon: Owens Loffler, MD;  Location: [facility];  Service: Endoscopy;  Laterality: N/A;  . EYE SURGERY Bilateral    Bi lateral cateracts and bi lateral laser  . LAPAROSCOPY N/A 02/25/2013   Procedure: Cystoscopy and  laparoscopy with fulguration of uterine serosa;  Surgeon: Jamey Reas de Berton Lan, MD;  Location: [facility];  Service: Gynecology;  Laterality: N/A;  . TONSILLECTOMY      Family History  Problem Relation Age of Onset  . Diabetes Mother   . Stroke Father        deceased age 49  . Heart disease Sister        deceased MI age 54  . Heart disease Brother        deceased MI age 71  . Diabetes Maternal Grandmother   . Hypertension Paternal Grandmother   . Diabetes Sister   .  Heart disease Sister   . Hypertension Sister   . Hyperlipidemia Sister   . Diabetes Sister   . Heart disease Sister   . Hypertension Sister   . Hyperlipidemia Sister   . Diabetes Brother   . Heart disease Brother   . Hypertension Brother   . Hyperlipidemia Brother   . Colon cancer Neg Hx   . Esophageal cancer Neg Hx   . Stomach cancer Neg Hx   . Rectal cancer Neg Hx     Allergies  Allergen Reactions  . Tramadol Other (See Comments)    Dizziness     Current Outpatient Medications on File Prior to Visit  Medication Sig Dispense Refill  . amLODipine (NORVASC) 5 MG tablet TAKE 1 TABLET BY MOUTH  DAILY 90 tablet 3  . aspirin 81 MG tablet Take 81 mg by mouth daily.    . cholecalciferol (VITAMIN D) 1000 UNITS tablet Take 1,000 Units by mouth daily.      Marland Kitchen dicyclomine (BENTYL) 10 MG capsule TAKE ONE CAPSULE BY MOUTH 4 TIMES DAILY AS NEEDED 120 capsule 1  . esomeprazole (NEXIUM) 40 MG capsule Take 1 capsule (40 mg total) by mouth daily before breakfast. 90 capsule 6  . FOLBIC 2.5-25-2 MG TABS tablet TAKE 1 TABLET BY MOUTH EVERY DAY 90 tablet 1  . glucose blood (ONE TOUCH ULTRA TEST) test strip USE 2 TIMES DAILY FOR DIABETIC TESTING 700 each 2  . hydrALAZINE (APRESOLINE) 10 MG tablet Take 1 tablet (10 mg total) by mouth 2 (two) times daily. 60 tablet 3  . losartan (COZAAR) 50 MG tablet TAKE 1 TABLET BY MOUTH TWO  TIMES DAILY 180 tablet 1  . metFORMIN (GLUCOPHAGE-XR) 500 MG 24 hr tablet Take 1 tablet  (500 mg total) by mouth 3 (three) times daily with meals. 270 tablet 1  . metoCLOPramide (REGLAN) 5 MG tablet TAKE 1 TABLET BY MOUTH TWO  TIMES DAILY 180 tablet 0  . metoprolol tartrate (LOPRESSOR) 50 MG tablet TAKE 1 AND 1/2 TABLETS BY  MOUTH TWICE DAILY 270 tablet 1  . Multiple Vitamins-Calcium (VIACTIV MULTI-VITAMIN) CHEW Chew 2 each by mouth daily.    . ondansetron (ZOFRAN) 4 MG tablet Take 4 mg by mouth every 4 (four) hours as needed. For nausea      . ONETOUCH DELICA LANCETS 82U MISC USE 2 TIMES DAILY FOR DIABETIC TESTING 100 each 1  . potassium chloride (K-DUR,KLOR-CON) 10 MEQ tablet TAKE 1 TABLET BY MOUTH  DAILY 90 tablet 2  . rosuvastatin (CRESTOR) 10 MG tablet TAKE 1 TABLET BY MOUTH  DAILY 90 tablet 2  . sodium chloride (OCEAN) 0.65 % SOLN nasal spray Place 1 spray into both nostrils as needed for congestion. 15 mL 0   No current facility-administered medications on file prior to visit.     BP (!) 149/64   Pulse 62   Temp 98.2 F (36.8 C) (Oral)   Resp 16   Ht 5\' 1"  (1.549 m)   Wt 173 lb 12.8 oz (78.8 kg)   LMP 02/07/1992   SpO2 100%   BMI 32.84 kg/m       Objective:   Physical Exam  General  Mental Status - Alert. General Appearance - Well groomed. Not in acute distress.  Skin Rashes- No Rashes.  HEENT Head- Normal. Ear Auditory Canal - Left- Normal. Right - Normal.Tympanic Membrane- Left- Normal. Right- Normal. Eye Sclera/Conjunctiva- Left- Normal. Right- Normal. Nose & Sinuses Nasal Mucosa- Left-  Boggy and Congested. Right-  Boggy and  Congested.Bilateral faint  maxillary and faint  frontal sinus pressure. Mouth & Throat Lips: Upper Lip- Normal: no dryness, cracking, pallor, cyanosis, or vesicular eruption. Lower Lip-Normal: no dryness, cracking, pallor, cyanosis or vesicular eruption. Buccal Mucosa- Bilateral- No Aphthous ulcers. Oropharynx- No Discharge or Erythema. Tonsils: Characteristics- Bilateral- No Erythema or Congestion. Size/Enlargement-  Bilateral- No enlargement. Discharge- bilateral-None. Slight faint white patch left side buccal mucos near back molar. No breakdown or erosion.   Neck Neck- Supple. No Masses.   Chest and Lung Exam Auscultation: Breath Sounds:-Clear even and unlabored.  Cardiovascular Auscultation:Rythm- Regular, rate and rhythm. Murmurs & Other Heart Sounds:Ausculatation of the heart reveal- No Murmurs.  Lymphatic Head & Neck General Head & Neck Lymphatics: Bilateral: Description- slight submandibular node tenderness.       Assessment & Plan:  You do have history of left ear region pain, burning tongue sensation, history of thrush and some sinus region pain.  Treatment with antibiotic and prednisone did not relieve symptoms but may have caused thrush  By discussion with you and review of your chart it appears that your PCP had ordered a CT of your sinuses to determine if your symptoms are associated with potential sinus disease/infection.  I would recommend that you go downstairs today and see if you  can get that scheduled.  Faint whitish coloration to left side bucca mucosa near her molars.  With your history of thrush in the past, I did prescribe you Diflucan 1 tablet for 1 day.  With your history of burning tongue sensation, I do want to expand work-up and get B12, B1 and folate lab.  Also prescribe you Magic mouthwash.  Rx advisement given.  Also with your described tenderness to left submandibular lymph node, I did place order to get a CBC.  Will send this note to your PCP to review and get opinion if she thinks ENT referral indicated as he did question possible ENT referral.  Follow-up 7 to 10 days or as needed.  40 minutes spent with patient today.  50% of time spent with patient counseling on potential differential diagnosis of her symptoms in September.  Towards the end additional time was also spent regarding potential trigeminal neuralgia as she did mention at the very end of the  exam that sometimes she does think cold air hurts the left side of her face.  So we will follow the above work-up and explained to patient might be giving a medication for nerve pain after review imaging and labs.  [name], PA-C

## 2018-03-22 ENCOUNTER — Ambulatory Visit (HOSPITAL_BASED_OUTPATIENT_CLINIC_OR_DEPARTMENT_OTHER)
Admission: RE | Admit: 2018-03-22 | Discharge: 2018-03-22 | Disposition: A | Discharge status: Home / Self Care | Payer: Medicare Other | Source: Ambulatory Visit | Attending: Family Medicine | Admitting: Family Medicine

## 2018-03-22 DIAGNOSIS — R220 Localized swelling, mass and lump, head: Secondary | ICD-10-CM | POA: Insufficient documentation

## 2018-03-22 DIAGNOSIS — H9202 Otalgia, left ear: Secondary | ICD-10-CM | POA: Insufficient documentation

## 2018-03-22 DIAGNOSIS — K1370 Unspecified lesions of oral mucosa: Secondary | ICD-10-CM | POA: Insufficient documentation

## 2018-03-22 MED ORDER — IOPAMIDOL (ISOVUE-300) INJECTION 61%
100.0000 mL | Freq: Once | INTRAVENOUS | Status: AC | PRN
  Administered 2018-03-22: 75 mL via INTRAVENOUS

## 2018-03-23 LAB — VITAMIN B1: Vitamin B1 (Thiamine): 10 nmol/L (ref 8–30)

## 2018-04-01 ENCOUNTER — Encounter: Payer: Self-pay | Admitting: Medical

## 2018-04-01 ENCOUNTER — Ambulatory Visit (INDEPENDENT_AMBULATORY_CARE_PROVIDER_SITE_OTHER): Payer: Medicare Other | Admitting: Medical

## 2018-04-01 VITALS — BP 134/48 | HR 61 | Temp 98.3°F | Resp 16 | Ht 61.0 in | Wt 172.0 lb

## 2018-04-01 DIAGNOSIS — R1032 Left lower quadrant pain: Secondary | ICD-10-CM | POA: Diagnosis not present

## 2018-04-01 DIAGNOSIS — R591 Generalized enlarged lymph nodes: Secondary | ICD-10-CM | POA: Diagnosis not present

## 2018-04-01 DIAGNOSIS — K5790 Diverticulosis of intestine, part unspecified, without perforation or abscess without bleeding: Secondary | ICD-10-CM | POA: Diagnosis not present

## 2018-04-01 DIAGNOSIS — R6884 Jaw pain: Secondary | ICD-10-CM

## 2018-04-01 DIAGNOSIS — G8929 Other chronic pain: Secondary | ICD-10-CM

## 2018-04-01 LAB — CBC WITH DIFFERENTIAL/PLATELET
BASOS ABS: 0.1 10*3/uL (ref 0.0–0.1)
Basophils Relative: 0.8 % (ref 0.0–3.0)
EOS PCT: 0.9 % (ref 0.0–5.0)
Eosinophils Absolute: 0.1 10*3/uL (ref 0.0–0.7)
HEMATOCRIT: 38.3 % (ref 36.0–46.0)
HEMOGLOBIN: 13 g/dL (ref 12.0–15.0)
LYMPHS ABS: 2.3 10*3/uL (ref 0.7–4.0)
LYMPHS PCT: 29.3 % (ref 12.0–46.0)
MCHC: 34.1 g/dL (ref 30.0–36.0)
MCV: 86.7 fl (ref 78.0–100.0)
MONOS PCT: 8.6 % (ref 3.0–12.0)
Monocytes Absolute: 0.7 10*3/uL (ref 0.1–1.0)
NEUTROS PCT: 60.4 % (ref 43.0–77.0)
Neutro Abs: 4.8 10*3/uL (ref 1.4–7.7)
Platelets: 221 10*3/uL (ref 150.0–400.0)
RBC: 4.41 Mil/uL (ref 3.87–5.11)
RDW: 12.4 % (ref 11.5–15.5)
WBC: 7.9 10*3/uL (ref 4.0–10.5)

## 2018-04-01 LAB — POC URINALSYSI DIPSTICK (AUTOMATED)
Bilirubin, UA: NEGATIVE
Blood, UA: NEGATIVE
Glucose, UA: NEGATIVE
Ketones, UA: NEGATIVE
Leukocytes, UA: NEGATIVE
Nitrite, UA: NEGATIVE
Protein, UA: NEGATIVE
Spec Grav, UA: 1.01 (ref 1.010–1.025)
Urobilinogen, UA: NEGATIVE E.U./dL — AB
pH, UA: 6 (ref 5.0–8.0)

## 2018-04-01 MED ORDER — FLUCONAZOLE 150 MG PO TABS: ORAL_TABLET | ORAL | 0 refills | Status: DC

## 2018-04-01 MED ORDER — CIPROFLOXACIN HCL 500 MG PO TABS: 500.0000 mg | ORAL_TABLET | Freq: Two times a day (BID) | ORAL | 0 refills | Status: DC

## 2018-04-01 MED ORDER — METRONIDAZOLE 500 MG PO TABS: 500.0000 mg | ORAL_TABLET | Freq: Three times a day (TID) | ORAL | 0 refills | Status: DC

## 2018-04-01 MED FILL — metroNIDAZOLE 500 MG TABS: 500 | 10 days supply | Qty: 30 | Fill #0

## 2018-04-01 MED FILL — CIPROFLOXACIN HCL 500 MG TA: 500 | 10 days supply | Qty: 20 | Fill #0

## 2018-04-01 MED FILL — FLUCONAZOLE 150 MG TABS: 150 | 10 days supply | Qty: 2 | Fill #0

## 2018-04-01 NOTE — Progress Notes (Signed)
Subjective:    [name], female    DOB: 02-20-1940, 78 y.o.   MRN: [id number]  HPI Pt in for recent left lower quadrant pain over past 3-4 days. Pain moderate recently. Pt has history of diverticulitis in the past. Last ct showed diverticulosis. Pain level was moderate to severe on weekend.  Pt has no fever or sweats this weekend. She felt chills this weekend.   Review of Systems  Constitutional: Positive for chills. Negative for fatigue and fever.  Respiratory: Negative for cough, chest tightness, wheezing and stridor.   Cardiovascular: Negative for chest pain and palpitations.  Gastrointestinal: Positive for abdominal pain. Negative for diarrhea, nausea and vomiting.  Musculoskeletal: Negative for back pain.  Skin: Negative for rash.  Neurological: Negative for dizziness, syncope, weakness, light-headedness and headaches.  Hematological: Negative for adenopathy. Does not bruise/bleed easily.  Psychiatric/Behavioral: Negative for behavioral problems and confusion.    Past Medical History:  Diagnosis Date  . Abdominal pain in female 03/18/2010   Qualifier: Diagnosis of  By: Carlean Purl MD, Dimas Millin Anemia 06/08/2014  . Anxiety   . Arthritis    Spinal Osteoarthritis  . Cancer ([place])   . Carcinoid tumor of stomach   . Cataract   . Chest pain    Myoview 12/15 no ischemia.  . Chronic kidney disease    Left kidney smaller than right kidney  . Constipation 11/21/2016  . Diabetes mellitus type 2 in obese ([place]) 09/05/2006   Qualifier: Diagnosis of  By: Marca Ancona RMA, Lucy    . Diabetic peripheral neuropathy ([place]) 10/29/2013  . Diverticulosis 08/30/2000   Colonoscopy   . Encounter for preventative adult health care exam with abnormal findings 09/14/2013  . Esophageal reflux   . Gastric polyp    Fundic Gland  . Gastroparesis   . Headache(784.0)   . Heart murmur    Echocardiogram 2/11: EF 60-65%, mild LAE, grade 1 diastolic dysfunction, aortic valve sclerosis,  mean gradient 9 mm of mercury, PASP 34  . Hematuria 03/16/2016  . Iron deficiency anemia, unspecified   . 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 Leg swelling    bilateral  . Neck pain 04/22/2015  . PONV (postoperative nausea and vomiting)    pt states only needs small amount of anesthesia  . PSVT (paroxysmal supraventricular tachycardia) ([place])   . Pure hypercholesterolemia   . 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 Stroke ([place])    tia, 2014  . 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 Type II or unspecified type diabetes mellitus without mention of complication, not stated as uncontrolled   . Unspecified essential hypertension   . Unspecified hereditary and idiopathic peripheral neuropathy 10/29/2013     Social History   Socioeconomic History  . Marital status: Widowed    Spouse name: Not on file  . Number of children: 0  . Years of education: college  . Highest education level: Not on file  Occupational History  . Occupation: retired  Scientific laboratory technician  . Financial resource strain: Not on file  . Food insecurity:    Worry: Not on file    Inability: Not on file  . Transportation needs:    Medical: Not on file    Non-medical: Not on file  Tobacco Use  . Smoking status: Never Smoker  . Smokeless tobacco: Never Used  . Tobacco comment: Never used tobacco  Substance and Sexual Activity  . Alcohol use: No    Alcohol/week: 0.0 standard drinks  . Drug use:  No  . Sexual activity: Never    Partners: Male    Birth control/protection: Post-menopausal    Comment: lives alone, no dietary restrictions except avoid fresh veg, fruit, whole grains  Lifestyle  . Physical activity:    Days per week: Not on file    Minutes per session: Not on file  . Stress: Not on file  Relationships  . Social connections:    Talks on phone: Not on file    Gets together: Not on file    Attends religious service: Not on file    Active member of club or organization: Not on file    Attends meetings of clubs or organizations: Not on file      Relationship status: Not on file  . Intimate partner violence:    Fear of current or ex partner: Not on file    Emotionally abused: Not on file    Physically abused: Not on file    Forced sexual activity: Not on file  Other Topics Concern  . Not on file  Social History Narrative   Patient was married ([name]) - widow   Patient does not have any children.   Patient is right-handed.   Patient has a BA degree.   One caffeine drink daily     Past Surgical History:  Procedure Laterality Date  . CHOLECYSTECTOMY  1993  . COLONOSCOPY  11/11/2010   diverticulosis  . DILATATION & CURRETTAGE/HYSTEROSCOPY WITH RESECTOCOPE N/A 02/25/2013   Procedure: Attempted hysteroscopy with uterine perforation;  Surgeon: Jamey Reas de Berton Lan, MD;  Location: [facility];  Service: Gynecology;  Laterality: N/A;  . ESOPHAGOGASTRODUODENOSCOPY  08/29/2010; 09/15/2010   Carcinoid tumor less than 1 cm in July 2012 not seen in August 2012 , gastritis, fundic gland polyps  . ESOPHAGOGASTRODUODENOSCOPY  05/16/2011  . ESOPHAGOGASTRODUODENOSCOPY  06/14/2012  . EUS  12/15/2010   Procedure: UPPER ENDOSCOPIC ULTRASOUND (EUS) LINEAR;  Surgeon: Owens Loffler, MD;  Location: [facility];  Service: Endoscopy;  Laterality: N/A;  . EYE SURGERY Bilateral    Bi lateral cateracts and bi lateral laser  . LAPAROSCOPY N/A 02/25/2013   Procedure: Cystoscopy and laparoscopy with fulguration of uterine serosa;  Surgeon: Jamey Reas de Berton Lan, MD;  Location: [facility];  Service: Gynecology;  Laterality: N/A;  . TONSILLECTOMY      Family History  Problem Relation Age of Onset  . Diabetes Mother   . Stroke Father        deceased age 29  . Heart disease Sister        deceased MI age 5  . Heart disease Brother        deceased MI age 101  . Diabetes Maternal Grandmother   . Hypertension Paternal Grandmother   . Diabetes Sister   . Heart disease Sister   . Hypertension Sister   . Hyperlipidemia Sister   .  Diabetes Sister   . Heart disease Sister   . Hypertension Sister   . Hyperlipidemia Sister   . Diabetes Brother   . Heart disease Brother   . Hypertension Brother   . Hyperlipidemia Brother   . Colon cancer Neg Hx   . Esophageal cancer Neg Hx   . Stomach cancer Neg Hx   . Rectal cancer Neg Hx     Allergies  Allergen Reactions  . Tramadol Other (See Comments)    Dizziness     Current Outpatient Medications on File Prior to Visit  Medication Sig Dispense Refill  .  amLODipine (NORVASC) 5 MG tablet TAKE 1 TABLET BY MOUTH  DAILY 90 tablet 3  . aspirin 81 MG tablet Take 81 mg by mouth daily.    . cholecalciferol (VITAMIN D) 1000 UNITS tablet Take 1,000 Units by mouth daily.      Marland Kitchen dicyclomine (BENTYL) 10 MG capsule TAKE ONE CAPSULE BY MOUTH 4 TIMES DAILY AS NEEDED 120 capsule 1  . esomeprazole (NEXIUM) 40 MG capsule Take 1 capsule (40 mg total) by mouth daily before breakfast. 90 capsule 6  . FOLBIC 2.5-25-2 MG TABS tablet TAKE 1 TABLET BY MOUTH EVERY DAY 90 tablet 1  . glucose blood (ONE TOUCH ULTRA TEST) test strip USE 2 TIMES DAILY FOR DIABETIC TESTING 700 each 2  . hydrALAZINE (APRESOLINE) 10 MG tablet Take 1 tablet (10 mg total) by mouth 2 (two) times daily. 60 tablet 3  . losartan (COZAAR) 50 MG tablet TAKE 1 TABLET BY MOUTH TWO  TIMES DAILY 180 tablet 1  . magic mouthwash SOLN 5 ml po qid swish and spit.(equal volumes of liquid benadryl, viscous lidocaine and maalox) 200 mL 0  . metFORMIN (GLUCOPHAGE-XR) 500 MG 24 hr tablet Take 1 tablet (500 mg total) by mouth 3 (three) times daily with meals. 270 tablet 1  . metoCLOPramide (REGLAN) 5 MG tablet TAKE 1 TABLET BY MOUTH TWO  TIMES DAILY 180 tablet 0  . metoprolol tartrate (LOPRESSOR) 50 MG tablet TAKE 1 AND 1/2 TABLETS BY  MOUTH TWICE DAILY 270 tablet 1  . Multiple Vitamins-Calcium (VIACTIV MULTI-VITAMIN) CHEW Chew 2 each by mouth daily.    . ondansetron (ZOFRAN) 4 MG tablet Take 4 mg by mouth every 4 (four) hours as needed. For  nausea      . ONETOUCH DELICA LANCETS 01U MISC USE 2 TIMES DAILY FOR DIABETIC TESTING 100 each 1  . potassium chloride (K-DUR,KLOR-CON) 10 MEQ tablet TAKE 1 TABLET BY MOUTH  DAILY 90 tablet 2  . rosuvastatin (CRESTOR) 10 MG tablet TAKE 1 TABLET BY MOUTH  DAILY 90 tablet 2  . sodium chloride (OCEAN) 0.65 % SOLN nasal spray Place 1 spray into both nostrils as needed for congestion. 15 mL 0   No current facility-administered medications on file prior to visit.     BP (!) 134/48   Pulse 61   Temp 98.3 F (36.8 C) (Oral)   Resp 16   Ht 5\' 1"  (1.549 m)   Wt 172 lb (78 kg)   LMP 02/07/1992   SpO2 100%   BMI 32.50 kg/m       Objective:   Physical Exam  General Appearance- Not in acute distress.  HEENT Eyes- Scleraeral/Conjuntiva-bilat- Not Yellow. Mouth & Throat- Normal.  Chest and Lung Exam Auscultation: Breath sounds:-Normal. Adventitious sounds:- No Adventitious sounds.  Cardiovascular Auscultation:Rythm - Regular. Heart Sounds -Normal heart sounds.  Abdomen Inspection:-Inspection Normal.  Palpation/Perucssion: Palpation and Percussion of the abdomen reveal- left lower quadrant Tenderness, No Rebound tenderness, No rigidity(Guarding) and No Palpable abdominal masses.  Liver:-Normal.  Spleen:- Normal.   Back- faint left cva tenderness.  Heent- similar as before. Submandibular node swollen. Mild tender.       Assessment & Plan:  With your recent 3 to 4 days of moderate left lower quadrant, I do think you might have recurrent of diverticulitis flare.  I am prescribing Cipro and Flagyl antibiotic.  Will get CBC today.  If your pain is not gradually improving then will need to get CT abdomen pelvis.  Give [place] an update by this Thursday.  If your pain worsens/changes dramatically then recommend ED evaluation.  I did go ahead and make referral to gastroenterologist as it appears you are likely due for repeat colonoscopy.  With your history of possible thrush and having  to be on antibiotics again, I did go ahead and make Diflucan tablets available.  Will take Diflucan 1 tablet on day 5 and second tablet on day 10.  With your history of left side general region pain and slightly swollen left submandibular node, I did go ahead and make referral to ENT.  I did specify on referral to send you to a different one that you saw in the past.   Follow-up in 10 to 14 days or as needed.  [name], PA-C

## 2018-04-01 NOTE — Patient Instructions (Addendum)
With your recent 3 to 4 days of moderate left lower quadrant, I do think you might have recurrent of diverticulitis flare.  I am prescribing Cipro and Flagyl antibiotic.  Will get CBC today.  If your pain is not gradually improving then will need to get CT abdomen pelvis.  Give [place] an update by this Thursday.  If your pain worsens/changes dramatically then recommend ED evaluation.  I did go ahead and make referral to gastroenterologist as it appears you are likely due for repeat colonoscopy.  With your history of possible thrush and having to be on antibiotics again, I did go ahead and make Diflucan tablets available.  Will take Diflucan 1 tablet on day 5 and second tablet on day 10.  With your history of left side general region pain and slightly swollen left submandibular node, I did go ahead and make referral to ENT.  I did specify on referral to send you to a different one that you saw in the past.   Follow-up in 10 to 14 days or as needed.

## 2018-04-04 ENCOUNTER — Telehealth: Payer: Self-pay | Admitting: Internal Medicine

## 2018-04-04 MED ORDER — AMOXICILLIN-POT CLAVULANATE 875-125 MG PO TABS: 1.0000 | ORAL_TABLET | Freq: Two times a day (BID) | ORAL | 0 refills | Status: DC

## 2018-04-04 NOTE — Telephone Encounter (Signed)
Please advise Sir? 

## 2018-04-04 NOTE — Telephone Encounter (Signed)
Pt was prescribed cipro and metronidazole from [facility], [place].  Pt stated that she is afraid to take these meds and that Dr. Carlean Purl "used to prescribe her something else that is one pill for infection."

## 2018-04-04 NOTE — Telephone Encounter (Signed)
Patient informed and confirmed pharmacy.

## 2018-04-04 NOTE — Telephone Encounter (Signed)
I reviewed her notes  Can use Augmentin 875 mg bid x 10 days no refill

## 2018-04-08 ENCOUNTER — Other Ambulatory Visit: Payer: Self-pay | Admitting: Family Medicine

## 2018-04-08 MED FILL — ONE TOUCH ULTRA TEST STRIPS: 75 days supply | Qty: 150 | Fill #3

## 2018-04-10 MED FILL — ONETOUCH DELICA PLUS LANCET: 50 days supply | Qty: 100 | Fill #1

## 2018-04-12 ENCOUNTER — Encounter: Payer: Self-pay | Admitting: Internal Medicine

## 2018-04-12 ENCOUNTER — Ambulatory Visit: Payer: Medicare Other | Admitting: Internal Medicine

## 2018-04-12 VITALS — BP 120/60 | HR 68 | Ht 60.5 in | Wt 172.0 lb

## 2018-04-12 DIAGNOSIS — K5732 Diverticulitis of large intestine without perforation or abscess without bleeding: Secondary | ICD-10-CM | POA: Diagnosis not present

## 2018-04-12 NOTE — Patient Instructions (Signed)
  We are giving you a low fiber diet to read and follow.   If your not any better in a week or so call us back.    I appreciate the opportunity to care for you. Silvano Rusk, MD, [facility]

## 2018-04-12 NOTE — Progress Notes (Signed)
[name] 78 y.o. 07/24/1940 [id number]  Assessment & Plan:   Encounter Diagnosis  Name Primary?  . Diverticulitis of colon Yes   Clinical scenario of signs/sxs and response to Tx consistent with improving acute diverticulitis.  She will complete Abx and if not all better 1-2 weeks let me know - consider CT vs additional Tx or both  Low fiber diet for a while  F/U PRN  [id number], Bonnita Levan, MD     Subjective:   Chief Complaint: diverticulitis  HPI Patient is here after PCP dx w/ diverticulitis - she called me after asking to take single antibiotic  like I had rxed in past rather than cipro and metronidazole as rxed by PCP. Swithch made. She is 75% better after starting tx 2/27. Bowels moving better. Still has some pain - some days worse some days better but trend towards better. Moves bowels qd now. Had some flank and even buttock or leg pain - no urinary sxs.  She has been treated for diverticulitis in past - at least a few times, last was 2019 by [place] - [name] - cipro monotx.  Colonoscopy 2012 sigmoid diverticulosis.  CT scans 2012 and 2017 did not show diverticulitis Allergies  Allergen Reactions  . Tramadol Other (See Comments)    Dizziness    Current Meds  Medication Sig  . amLODipine (NORVASC) 5 MG tablet TAKE 1 TABLET BY MOUTH  DAILY  . amoxicillin-clavulanate (AUGMENTIN) 875-125 MG tablet Take 1 tablet by mouth 2 (two) times daily.  Marland Kitchen aspirin 81 MG tablet Take 81 mg by mouth daily.  . cholecalciferol (VITAMIN D) 1000 UNITS tablet Take 1,000 Units by mouth daily.    Marland Kitchen dicyclomine (BENTYL) 10 MG capsule TAKE ONE CAPSULE BY MOUTH 4 TIMES DAILY AS NEEDED  . esomeprazole (NEXIUM) 40 MG capsule Take 1 capsule (40 mg total) by mouth daily before breakfast.  . fluconazole (DIFLUCAN) 150 MG tablet 1 tablet in 5 days and second tablet in 10 days  . FOLBIC 2.5-25-2 MG TABS tablet TAKE 1 TABLET BY MOUTH EVERY DAY  . glucose blood (ONE TOUCH ULTRA TEST)  test strip USE 2 TIMES DAILY FOR DIABETIC TESTING  . hydrALAZINE (APRESOLINE) 10 MG tablet Take 1 tablet (10 mg total) by mouth 2 (two) times daily.  Marland Kitchen losartan (COZAAR) 50 MG tablet TAKE 1 TABLET BY MOUTH TWO  TIMES DAILY  . magic mouthwash SOLN 5 ml po qid swish and spit.(equal volumes of liquid benadryl, viscous lidocaine and maalox)  . metFORMIN (GLUCOPHAGE-XR) 500 MG 24 hr tablet Take 1 tablet (500 mg total) by mouth 3 (three) times daily with meals.  . metoCLOPramide (REGLAN) 5 MG tablet TAKE 1 TABLET BY MOUTH TWO  TIMES DAILY  . metoprolol tartrate (LOPRESSOR) 50 MG tablet TAKE 1 AND 1/2 TABLETS BY  MOUTH TWICE DAILY  . Multiple Vitamins-Calcium (VIACTIV MULTI-VITAMIN) CHEW Chew 2 each by mouth daily.  . ondansetron (ZOFRAN) 4 MG tablet Take 4 mg by mouth every 4 (four) hours as needed. For nausea    . ONETOUCH DELICA LANCETS 23R MISC USE 2 TIMES DAILY FOR DIABETIC TESTING  . potassium chloride (K-DUR,KLOR-CON) 10 MEQ tablet TAKE 1 TABLET BY MOUTH  DAILY  . rosuvastatin (CRESTOR) 10 MG tablet TAKE 1 TABLET BY MOUTH  DAILY  . sodium chloride (OCEAN) 0.65 % SOLN nasal spray Place 1 spray into both nostrils as needed for congestion.   Past Medical History:  Diagnosis Date  . Abdominal pain in female 03/18/2010  Qualifier: Diagnosis of  By: Carlean Purl MD, Dimas Millin Anemia 06/08/2014  . Anxiety   . Arthritis    Spinal Osteoarthritis  . Cancer ([place])   . Carcinoid tumor of stomach   . Cataract   . Chest pain    Myoview 12/15 no ischemia.  . Chronic kidney disease    Left kidney smaller than right kidney  . Constipation 11/21/2016  . Diabetes mellitus type 2 in obese ([place]) 09/05/2006   Qualifier: Diagnosis of  By: Marca Ancona RMA, Lucy    . Diabetic peripheral neuropathy ([place]) 10/29/2013  . Diverticulosis 08/30/2000   Colonoscopy   . Encounter for preventative adult health care exam with abnormal findings 09/14/2013  . Esophageal reflux   . Gastric polyp    Fundic Gland  . Gastroparesis    . Headache(784.0)   . Heart murmur    Echocardiogram 2/11: EF 60-65%, mild LAE, grade 1 diastolic dysfunction, aortic valve sclerosis, mean gradient 9 mm of mercury, PASP 34  . Hematuria 03/16/2016  . Iron deficiency anemia, unspecified   . 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 Leg swelling    bilateral  . Neck pain 04/22/2015  . PONV (postoperative nausea and vomiting)    pt states only needs small amount of anesthesia  . PSVT (paroxysmal supraventricular tachycardia) ([place])   . Pure hypercholesterolemia   . 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 Stroke ([place])    tia, 2014  . 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 Type II or unspecified type diabetes mellitus without mention of complication, not stated as uncontrolled   . Unspecified essential hypertension   . Unspecified hereditary and idiopathic peripheral neuropathy 10/29/2013   Past Surgical History:  Procedure Laterality Date  . CHOLECYSTECTOMY  1993  . COLONOSCOPY  11/11/2010   diverticulosis  . DILATATION & CURRETTAGE/HYSTEROSCOPY WITH RESECTOCOPE N/A 02/25/2013   Procedure: Attempted hysteroscopy with uterine perforation;  Surgeon: Jamey Reas de Berton Lan, MD;  Location: [facility];  Service: Gynecology;  Laterality: N/A;  . ESOPHAGOGASTRODUODENOSCOPY  08/29/2010; 09/15/2010   Carcinoid tumor less than 1 cm in July 2012 not seen in August 2012 , gastritis, fundic gland polyps  . ESOPHAGOGASTRODUODENOSCOPY  05/16/2011  . ESOPHAGOGASTRODUODENOSCOPY  06/14/2012  . EUS  12/15/2010   Procedure: UPPER ENDOSCOPIC ULTRASOUND (EUS) LINEAR;  Surgeon: Owens Loffler, MD;  Location: [facility];  Service: Endoscopy;  Laterality: N/A;  . EYE SURGERY Bilateral    Bi lateral cateracts and bi lateral laser  . LAPAROSCOPY N/A 02/25/2013   Procedure: Cystoscopy and laparoscopy with fulguration of uterine serosa;  Surgeon: Jamey Reas de Berton Lan, MD;  Location: [facility];  Service: Gynecology;  Laterality: N/A;  . 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   Patient is right-handed.   Patient has a BA degree.   One caffeine drink daily    family history includes Diabetes in her brother, maternal grandmother, mother, sister, and sister; Heart disease in her brother, brother, sister, sister, and sister; Hyperlipidemia in her brother, sister, and sister; Hypertension in her brother, paternal grandmother, sister, and sister; Stroke in her father.   Review of Systems As above  Objective:   Physical Exam BP 120/60 (BP Location: Left Arm, Patient Position: Sitting, Cuff Size: Normal)   Pulse 68   Ht 5' 0.5" (1.537 m)   Wt 172 lb (78 kg)  LMP 02/07/1992   BMI 33.04 kg/m  NAD Abdomen - soft, mildly tender LLQ w/o mass or hernia and no rebound  15 minutes time spent with patient > half in counseling coordination of care

## 2018-04-23 DIAGNOSIS — R0989 Other specified symptoms and signs involving the circulatory and respiratory systems: Secondary | ICD-10-CM | POA: Insufficient documentation

## 2018-04-23 DIAGNOSIS — H9202 Otalgia, left ear: Secondary | ICD-10-CM | POA: Insufficient documentation

## 2018-04-23 DIAGNOSIS — R6889 Other general symptoms and signs: Secondary | ICD-10-CM | POA: Diagnosis not present

## 2018-04-24 ENCOUNTER — Ambulatory Visit: Payer: Medicare Other | Admitting: Family

## 2018-04-24 ENCOUNTER — Other Ambulatory Visit: Payer: Medicare Other

## 2018-04-29 ENCOUNTER — Telehealth: Payer: Self-pay | Admitting: Family Medicine

## 2018-04-29 NOTE — Telephone Encounter (Signed)
Copied from [facility] [id number]. Topic: General - Inquiry >> Apr 29, 2018 12:32 PM Gustavus Messing wrote: Reason for CRM: Patient called wanting to speak to Dr. Charlett Blake or her nurse because she had her B12 tested last time she was there and she would like the advice of the Dr. On what to do about it.

## 2018-04-30 ENCOUNTER — Ambulatory Visit: Payer: Medicare Other | Admitting: Family Medicine

## 2018-05-03 ENCOUNTER — Ambulatory Visit: Payer: Self-pay

## 2018-05-03 ENCOUNTER — Telehealth: Payer: Self-pay | Admitting: Family Medicine

## 2018-05-03 DIAGNOSIS — E559 Vitamin D deficiency, unspecified: Secondary | ICD-10-CM

## 2018-05-03 DIAGNOSIS — E669 Obesity, unspecified: Secondary | ICD-10-CM

## 2018-05-03 DIAGNOSIS — I10 Essential (primary) hypertension: Secondary | ICD-10-CM

## 2018-05-03 DIAGNOSIS — R748 Abnormal levels of other serum enzymes: Secondary | ICD-10-CM

## 2018-05-03 DIAGNOSIS — E1169 Type 2 diabetes mellitus with other specified complication: Secondary | ICD-10-CM

## 2018-05-03 DIAGNOSIS — E782 Mixed hyperlipidemia: Secondary | ICD-10-CM

## 2018-05-03 NOTE — Telephone Encounter (Signed)
Patient called and says the last time her vitamin B12 was tested, it was very high. She says she takes it every other day or every 2-3 days since February. She is asking what does Dr. Charlett Blake want her to do about the level being high and says that if she doesn't take it, he iron level with get low again. I advised I will send her concerns to Dr. Charlett Blake and someone will call back with her recommendation, patient verbalized understanding.

## 2018-05-03 NOTE — Telephone Encounter (Signed)
This encounter was created in error - please disregard.

## 2018-05-03 NOTE — Telephone Encounter (Signed)
I offered a virtual visit she declined she said she will wait to see you

## 2018-05-03 NOTE — Addendum Note (Signed)
Addended by: Matilde Sprang on: 05/03/2018 11:15 AM   Modules accepted: Level of Service, SmartSet

## 2018-05-03 NOTE — Telephone Encounter (Signed)
She has labs ordered. That is the only way I can give her advise. Have her come in and get that done then set her up for a virtual visit next week once it is done.

## 2018-05-03 NOTE — Telephone Encounter (Signed)
Labs placed she stated she wants to wait until may

## 2018-05-03 NOTE — Telephone Encounter (Signed)
Returned call to patient. Left VM to return call to office. 

## 2018-05-03 NOTE — Telephone Encounter (Signed)
Please advise 

## 2018-05-03 NOTE — Telephone Encounter (Signed)
Left message for patient to call the office back   Nurse triage may handle  

## 2018-05-03 NOTE — Addendum Note (Signed)
Addended by: Magdalene Molly A on: 05/03/2018 11:39 AM   Modules accepted: Orders

## 2018-05-07 ENCOUNTER — Ambulatory Visit: Payer: Medicare Other | Admitting: Family Medicine

## 2018-05-23 ENCOUNTER — Telehealth: Payer: Self-pay

## 2018-05-23 NOTE — Telephone Encounter (Signed)
Copied from [facility] [id number]. Topic: General - Inquiry >> May 23, 2018  2:17 PM Rainey Pines A wrote: Reason for CRM: Patient would like a callback from Dr. Charlett Blake or nurse in regards to lab appointment scheduled for 06/26/2018. Patient wants to know why there was an order for her to have labs done.

## 2018-05-23 NOTE — Telephone Encounter (Signed)
Returned patients call. Advised she had some labs that were abnormal and her provider wanted to have them re-drawn.

## 2018-05-24 ENCOUNTER — Other Ambulatory Visit: Payer: Self-pay | Admitting: Family Medicine

## 2018-05-30 ENCOUNTER — Other Ambulatory Visit: Payer: Self-pay | Admitting: Internal Medicine

## 2018-05-30 ENCOUNTER — Other Ambulatory Visit: Payer: Self-pay | Admitting: Cardiology

## 2018-05-30 NOTE — Telephone Encounter (Signed)
Lopressor 50 mg refilled. 

## 2018-06-07 ENCOUNTER — Telehealth: Payer: Self-pay

## 2018-06-07 NOTE — Telephone Encounter (Signed)
Virtual Visit Pre-Appointment Phone Call [facility] PHONE "(Name), I am calling you today to discuss your upcoming appointment. We are currently trying to limit exposure to the virus that causes COVID-19 by seeing patients at home rather than in the office."  1. "What is the BEST phone number to call the day of the visit?" - include this in appointment notes  2. "Do you have or have access to (through a family member/friend) a smartphone with video capability that we can use for your visit?" a. If yes - list this number in appt notes as "cell" (if different from BEST phone #) and list the appointment type as a VIDEO visit in appointment notes b. If no - list the appointment type as a PHONE visit in appointment notes  3. Confirm consent - "In the setting of the current Covid19 crisis, you are scheduled for a (phone or video) visit with your provider on (date) at (time).  Just as we do with many in-office visits, in order for you to participate in this visit, we must obtain consent.  If you'd like, I can send this to your mychart (if signed up) or email for you to review.  Otherwise, I can obtain your verbal consent now.  All virtual visits are billed to your insurance company just like a normal visit would be.  By agreeing to a virtual visit, we'd like you to understand that the technology does not allow for your provider to perform an examination, and thus may limit your provider's ability to fully assess your condition. If your provider identifies any concerns that need to be evaluated in person, we will make arrangements to do so.  Finally, though the technology is pretty good, we cannot assure that it will always work on either your or our end, and in the setting of a video visit, we may have to convert it to a phone-only visit.  In either situation, we cannot ensure that we have a secure connection.  Are you willing to proceed?" STAFF: Did the patient verbally acknowledge consent to  telehealth visit? Document YES/NO here:   4. Advise patient to be prepared - "Two hours prior to your appointment, go ahead and check your blood pressure, pulse, oxygen saturation, and your weight (if you have the equipment to check those) and write them all down. When your visit starts, your provider will ask you for this information. If you have an Apple Watch or Kardia device, please plan to have heart rate information ready on the day of your appointment. Please have a pen and paper handy nearby the day of the visit as well."  5. Give patient instructions for MyChart download to smartphone OR Doximity/Doxy.me as below if video visit (depending on what platform provider is using)  6. Inform patient they will receive a phone call 15 minutes prior to their appointment time (may be from unknown caller ID) so they should be prepared to answer    TELEPHONE CALL NOTE  [name] has been deemed a candidate for a follow-up tele-health visit to limit community exposure during the Covid-19 pandemic. I spoke with the patient via phone to ensure availability of phone/video source, confirm preferred email & phone number, and discuss instructions and expectations.  I reminded [name] to be prepared with any vital sign and/or heart rhythm information that could potentially be obtained via home monitoring, at the time of her visit. I reminded [name] to expect a phone  call prior to her visit.  Zebedee Iba, CMA 06/07/2018 4:10 PM   INSTRUCTIONS FOR DOWNLOADING THE MYCHART APP TO SMARTPHONE  - The patient must first make sure to have activated MyChart and know their login information - If Apple, go to [employer] and type in MyChart in the search bar and download the app. If Android, ask patient to go to [employer] and type in Ryan in the search bar and download the app. The app is free but as with any other app downloads, their phone may require them to verify saved  payment information or Apple/Android password.  - The patient will need to then log into the app with their MyChart username and password, and select [facility] as their healthcare provider to link the account. When it is time for your visit, go to the MyChart app, find appointments, and click Begin Video Visit. Be sure to Select Allow for your device to access the Microphone and Camera for your visit. You will then be connected, and your provider will be with you shortly.  **If they have any issues connecting, or need assistance please contact [facility] [phone])  **If using a computer, in order to ensure the best quality for their visit they will need to use either of the following Internet Browsers: [employer], or Google Chrome  IF USING DOXIMITY or DOXY.ME - The patient will receive a link just prior to their visit by text.     FULL LENGTH CONSENT FOR TELE-HEALTH VISIT   I hereby voluntarily request, consent and authorize [facility] and its employed or contracted physicians, physician assistants, nurse practitioners or other licensed health care professionals (the Practitioner), to provide me with telemedicine health care services (the "Services") as deemed necessary by the treating Practitioner. I acknowledge and consent to receive the Services by the Practitioner via telemedicine. I understand that the telemedicine visit will involve communicating with the Practitioner through live audiovisual communication technology and the disclosure of certain medical information by electronic transmission. I acknowledge that I have been given the opportunity to request an in-person assessment or other available alternative prior to the telemedicine visit and am voluntarily participating in the telemedicine visit.  I understand that I have the right to withhold or withdraw my consent to the use of telemedicine in the course of my care at any time, without affecting my  right to future care or treatment, and that the Practitioner or I may terminate the telemedicine visit at any time. I understand that I have the right to inspect all information obtained and/or recorded in the course of the telemedicine visit and may receive copies of available information for a reasonable fee.  I understand that some of the potential risks of receiving the Services via telemedicine include:  Marland Kitchen Delay or interruption in medical evaluation due to technological equipment failure or disruption; . Information transmitted may not be sufficient (e.g. poor resolution of images) to allow for appropriate medical decision making by the Practitioner; and/or  . In rare instances, security protocols could fail, causing a breach of personal health information.  Furthermore, I acknowledge that it is my responsibility to provide information about my medical history, conditions and care that is complete and accurate to the best of my ability. I acknowledge that Practitioner's advice, recommendations, and/or decision may be based on factors not within their control, such as incomplete or inaccurate data provided by me or distortions of diagnostic images or specimens that may result  from electronic transmissions. I understand that the practice of medicine is not an exact science and that Practitioner makes no warranties or guarantees regarding treatment outcomes. I acknowledge that I will receive a copy of this consent concurrently upon execution via email to the email address I last provided but may also request a printed copy by calling the office of [facility].    I understand that my insurance will be billed for this visit.   I have read or had this consent read to me. . I understand the contents of this consent, which adequately explains the benefits and risks of the Services being provided via telemedicine.  . I have been provided ample opportunity to ask questions regarding this consent and the  Services and have had my questions answered to my satisfaction. . I give my informed consent for the services to be provided through the use of telemedicine in my medical care  By participating in this telemedicine visit I agree to the above.

## 2018-06-10 ENCOUNTER — Telehealth: Payer: Self-pay | Admitting: Cardiology

## 2018-06-10 NOTE — Progress Notes (Signed)
Virtual Visit via Telephone Note   This visit type was conducted due to national recommendations for restrictions regarding the COVID-19 Pandemic (e.g. social distancing) in an effort to limit this patient's exposure and mitigate transmission in our community.  Due to her co-morbid illnesses, this patient is at least at moderate risk for complications without adequate follow up.  This format is felt to be most appropriate for this patient at this time.  The patient did not have access to video technology/had technical difficulties with video requiring transitioning to audio format only (telephone).  All issues noted in this document were discussed and addressed.  No physical exam could be performed with this format.  Please refer to the patient's chart for her  consent to telehealth for [facility].   Date:  06/11/2018   ID:  [name], DOB March 29, 1940, MRN [id number]  Patient Location: Home Provider Location: Home  PCP:  [name], MD  Cardiologist:  Minus Breeding, MD  Electrophysiologist:  None   Evaluation Performed:  Follow-Up Visit  Chief Complaint:  HTN   History of Present Illness:    [name] is a 78 y.o. female who presents for followup of SVT.   Since I last saw her since I last saw her she has done OK.  She has not had any palpitations suggestive of her previous SVT.  Her blood pressure runs high in the morning but it comes back down after she takes her medications. The patient denies any new symptoms such as chest discomfort, neck or arm discomfort. There has been no new shortness of breath, PND or orthopnea. There have been no reported palpitations, presyncope or syncope.  The patient does not have symptoms concerning for COVID-19 infection (fever, chills, cough, or new shortness of breath).    Past Medical History:  Diagnosis Date  . Abdominal pain in female 03/18/2010   Qualifier: Diagnosis of  By: Carlean Purl MD, Dimas Millin Anemia  06/08/2014  . Anxiety   . Arthritis    Spinal Osteoarthritis  . Cancer ([place])   . Carcinoid tumor of stomach   . Cataract   . Chest pain    Myoview 12/15 no ischemia.  . Chronic kidney disease    Left kidney smaller than right kidney  . Constipation 11/21/2016  . Diabetes mellitus type 2 in obese ([place]) 09/05/2006   Qualifier: Diagnosis of  By: Marca Ancona RMA, Lucy    . Diabetic peripheral neuropathy ([place]) 10/29/2013  . Encounter for preventative adult health care exam with abnormal findings 09/14/2013  . Esophageal reflux   . Gastric polyp    Fundic Gland  . Gastroparesis   . Headache(784.0)   . Heart murmur    Echocardiogram 2/11: EF 60-65%, mild LAE, grade 1 diastolic dysfunction, aortic valve sclerosis, mean gradient 9 mm of mercury, PASP 34  . Hematuria 03/16/2016  . Iron deficiency anemia, unspecified   . 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 Leg swelling    bilateral  . Neck pain 04/22/2015  . PONV (postoperative nausea and vomiting)    pt states only needs small amount of anesthesia  . PSVT (paroxysmal supraventricular tachycardia) ([place])   . Pure hypercholesterolemia   . 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 Stroke ([place])    tia, 2014  . 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 Type II or unspecified type diabetes mellitus without mention of complication, not stated as uncontrolled   . Unspecified essential hypertension   . Unspecified hereditary and idiopathic  peripheral neuropathy 10/29/2013   Past Surgical History:  Procedure Laterality Date  . CHOLECYSTECTOMY  1993  . COLONOSCOPY  11/11/2010   diverticulosis  . DILATATION & CURRETTAGE/HYSTEROSCOPY WITH RESECTOCOPE N/A 02/25/2013   Procedure: Attempted hysteroscopy with uterine perforation;  Surgeon: Jamey Reas de Berton Lan, MD;  Location: [facility];  Service: Gynecology;  Laterality: N/A;  . ESOPHAGOGASTRODUODENOSCOPY  08/29/2010; 09/15/2010   Carcinoid tumor less than 1 cm in July 2012 not seen in August 2012 , gastritis, fundic gland polyps  .  ESOPHAGOGASTRODUODENOSCOPY  05/16/2011  . ESOPHAGOGASTRODUODENOSCOPY  06/14/2012  . EUS  12/15/2010   Procedure: UPPER ENDOSCOPIC ULTRASOUND (EUS) LINEAR;  Surgeon: Owens Loffler, MD;  Location: [facility];  Service: Endoscopy;  Laterality: N/A;  . EYE SURGERY Bilateral    Bi lateral cateracts and bi lateral laser  . LAPAROSCOPY N/A 02/25/2013   Procedure: Cystoscopy and laparoscopy with fulguration of uterine serosa;  Surgeon: Jamey Reas de Berton Lan, MD;  Location: [facility];  Service: Gynecology;  Laterality: N/A;  . TONSILLECTOMY       Current Meds  Medication Sig  . amLODipine (NORVASC) 5 MG tablet TAKE 1 TABLET BY MOUTH  DAILY  . aspirin 81 MG tablet Take 81 mg by mouth daily.  . cholecalciferol (VITAMIN D) 1000 UNITS tablet Take 1,000 Units by mouth daily.    Marland Kitchen dicyclomine (BENTYL) 10 MG capsule TAKE ONE CAPSULE BY MOUTH 4 TIMES DAILY AS NEEDED  . esomeprazole (NEXIUM) 40 MG capsule Take 1 capsule (40 mg total) by mouth daily before breakfast.  . FOLBIC 2.5-25-2 MG TABS tablet TAKE 1 TABLET BY MOUTH EVERY DAY  . glucose blood (ONE TOUCH ULTRA TEST) test strip USE 2 TIMES DAILY FOR DIABETIC TESTING  . hydrALAZINE (APRESOLINE) 10 MG tablet Take 1 tablet (10 mg total) by mouth 2 (two) times daily.  . Lancets (ONETOUCH DELICA PLUS [id number]) MISC USE 2 TIMES DAILY FOR DIABETIC TESTING  . losartan (COZAAR) 50 MG tablet TAKE 1 TABLET BY MOUTH TWO  TIMES DAILY  . metFORMIN (GLUCOPHAGE-XR) 500 MG 24 hr tablet Take 1 tablet (500 mg total) by mouth 3 (three) times daily with meals.  . metoCLOPramide (REGLAN) 5 MG tablet TAKE 1 TABLET BY MOUTH TWO  TIMES DAILY  . metoprolol tartrate (LOPRESSOR) 50 MG tablet TAKE 1 AND 1/2 TABLETS BY  MOUTH TWICE DAILY  . ondansetron (ZOFRAN) 4 MG tablet Take 4 mg by mouth every 4 (four) hours as needed. For nausea    . potassium chloride (K-DUR,KLOR-CON) 10 MEQ tablet TAKE 1 TABLET BY MOUTH  DAILY  . rosuvastatin (CRESTOR) 10 MG tablet TAKE 1 TABLET BY  MOUTH  DAILY  . sodium chloride (OCEAN) 0.65 % SOLN nasal spray Place 1 spray into both nostrils as needed for congestion.  . [DISCONTINUED] amoxicillin-clavulanate (AUGMENTIN) 875-125 MG tablet Take 1 tablet by mouth 2 (two) times daily.  . [DISCONTINUED] Multiple Vitamins-Calcium (VIACTIV MULTI-VITAMIN) CHEW Chew 2 each by mouth daily.     Allergies:   Tramadol   Social History   Tobacco Use  . Smoking status: Never Smoker  . Smokeless tobacco: Never Used  . Tobacco comment: Never used tobacco  Substance Use Topics  . Alcohol use: No    Alcohol/week: 0.0 standard drinks  . Drug use: No     Family Hx: The patient's family history includes Diabetes in her brother, maternal grandmother, mother, sister, and sister; Heart disease in her brother, brother, sister, sister, and sister; Hyperlipidemia in  her brother, sister, and sister; Hypertension in her brother, paternal grandmother, sister, and sister; Stroke in her father. There is no history of Colon cancer, Esophageal cancer, Stomach cancer, or Rectal cancer.  ROS:   Please see the history of present illness.    As stated in the HPI and negative for all other systems.   Prior CV studies:   The following studies were reviewed today:  None  Labs/Other Tests and Data Reviewed:    EKG:  No ECG reviewed.  Recent Labs: 02/25/2018: ALT 22; BUN 15; Creatinine, Ser 0.70; Potassium 3.8; Sodium 138; TSH 2.55 04/01/2018: Hemoglobin 13.0; Platelets 221.0   Recent Lipid Panel Lab Results  Component Value Date/Time   CHOL 199 02/25/2018 09:04 AM   TRIG 102.0 02/25/2018 09:04 AM   TRIG 94 11/16/2005 09:05 AM   HDL 67.70 02/25/2018 09:04 AM   CHOLHDL 3 02/25/2018 09:04 AM   LDLCALC 111 (H) 02/25/2018 09:04 AM   LDLDIRECT 123.7 04/13/2011 08:48 AM    Wt Readings from Last 3 Encounters:  06/11/18 169 lb (76.7 kg)  04/12/18 172 lb (78 kg)  04/01/18 172 lb (78 kg)     Objective:    Vital Signs:  BP (!) 149/71   Pulse (!) 58    Ht 5\' 4"  (1.626 m)   Wt 169 lb (76.7 kg)   LMP 02/07/1992   BMI 29.01 kg/m    VITAL SIGNS:  reviewed  ASSESSMENT & PLAN:    SVT:    She has no recurrent symptoms.  No change in therapy is indicated.   Hypertension - Her blood pressure runs a little high in the morning but is well controlled rest of the day.  No change in therapy.  Dyslipidemia -  LDL was 111 most recently.  She does not want more aggressive therapy however.  Carotid stenosis - She has had only mild plaque.  No further testing.  Murmur - This is mild aortic stenosis and I will follow this clinically.   Aortic atherosclerosis - She will continue with risk reduction.  COVID-19 Education: The signs and symptoms of COVID-19 were discussed with the patient and how to seek care for testing (follow up with PCP or arrange E-visit).  The importance of social distancing was discussed today.  Time:   Today, I have spent 16 minutes with the patient with telehealth technology discussing the above problems.     Medication Adjustments/Labs and Tests Ordered: Current medicines are reviewed at length with the patient today.  Concerns regarding medicines are outlined above.   Tests Ordered: No orders of the defined types were placed in this encounter.   Medication Changes: No orders of the defined types were placed in this encounter.   Disposition:  Follow up follow up with me in one year  Signed, Minus Breeding, MD  06/11/2018 11:52 AM    [place]

## 2018-06-11 ENCOUNTER — Telehealth (INDEPENDENT_AMBULATORY_CARE_PROVIDER_SITE_OTHER): Payer: Medicare Other | Admitting: Cardiology

## 2018-06-11 ENCOUNTER — Encounter: Payer: Self-pay | Admitting: Cardiology

## 2018-06-11 VITALS — BP 149/71 | HR 58 | Ht 64.0 in | Wt 169.0 lb

## 2018-06-11 DIAGNOSIS — Z7189 Other specified counseling: Secondary | ICD-10-CM

## 2018-06-11 DIAGNOSIS — I471 Supraventricular tachycardia: Secondary | ICD-10-CM | POA: Diagnosis not present

## 2018-06-11 DIAGNOSIS — I10 Essential (primary) hypertension: Secondary | ICD-10-CM

## 2018-06-11 NOTE — Patient Instructions (Signed)
Medication Instructions:  Continue c If you need a refill on your cardiac medications before your next appointment, please call your pharmacy.  Labwork: None Ordered   Testing/Procedures: None Ordered  Follow-Up: You will need a follow up appointment in 1 Year.  Please call our office 2 months in advance to schedule this appointment.  You may see Minus Breeding, MD or one of the following Advanced Practice Providers on your designated Care Team:   Rosaria Ferries, PA-C . Jory Sims, DNP, ANP      At [facility], you and your health needs are our priority.  As part of our continuing mission to provide you with exceptional heart care, we have created designated Provider Care Teams.  These Care Teams include your primary Cardiologist (physician) and Advanced Practice Providers (APPs -  Physician Assistants and Nurse Practitioners) who all work together to provide you with the care you need, when you need it.  Thank you for choosing [facility] at [facility]!!

## 2018-06-12 ENCOUNTER — Telehealth: Payer: Medicare Other | Admitting: Cardiology

## 2018-06-13 ENCOUNTER — Ambulatory Visit: Payer: Medicare Other | Admitting: Cardiology

## 2018-06-25 MED FILL — ONE TOUCH ULTRA TEST STRIPS: 75 days supply | Qty: 150 | Fill #4

## 2018-06-26 ENCOUNTER — Other Ambulatory Visit: Payer: Self-pay

## 2018-06-26 ENCOUNTER — Other Ambulatory Visit (INDEPENDENT_AMBULATORY_CARE_PROVIDER_SITE_OTHER): Payer: Medicare Other

## 2018-06-26 DIAGNOSIS — E1169 Type 2 diabetes mellitus with other specified complication: Secondary | ICD-10-CM | POA: Diagnosis not present

## 2018-06-26 DIAGNOSIS — E669 Obesity, unspecified: Secondary | ICD-10-CM

## 2018-06-26 DIAGNOSIS — E782 Mixed hyperlipidemia: Secondary | ICD-10-CM

## 2018-06-26 DIAGNOSIS — I10 Essential (primary) hypertension: Secondary | ICD-10-CM

## 2018-06-26 DIAGNOSIS — R748 Abnormal levels of other serum enzymes: Secondary | ICD-10-CM | POA: Diagnosis not present

## 2018-06-26 LAB — LIPID PANEL
Cholesterol: 279 mg/dL — ABNORMAL HIGH (ref 0–200)
HDL: 75.8 mg/dL (ref >39.00)
LDL Cholesterol: 175 mg/dL — ABNORMAL HIGH (ref 0–99)
NonHDL: 203.37
Total CHOL/HDL Ratio: 4
Triglycerides: 144 mg/dL (ref 0.0–149.0)
VLDL: 28.8 mg/dL (ref 0.0–40.0)

## 2018-06-26 LAB — COMPREHENSIVE METABOLIC PANEL
ALT: 21 U/L (ref 0–35)
AST: 22 U/L (ref 0–37)
Albumin: 4.5 g/dL (ref 3.5–5.2)
Alkaline Phosphatase: 54 U/L (ref 39–117)
BUN: 16 mg/dL (ref 6–23)
CO2: 28 mEq/L (ref 19–32)
Calcium: 9.6 mg/dL (ref 8.4–10.5)
Chloride: 97 mEq/L (ref 96–112)
Creatinine, Ser: 0.67 mg/dL (ref 0.40–1.20)
GFR: 85.29 mL/min (ref >60.00)
Glucose, Bld: 109 mg/dL — ABNORMAL HIGH (ref 70–99)
Potassium: 3.8 mEq/L (ref 3.5–5.1)
Sodium: 136 mEq/L (ref 135–145)
Total Bilirubin: 0.9 mg/dL (ref 0.2–1.2)
Total Protein: 7.2 g/dL (ref 6.0–8.3)

## 2018-06-26 LAB — CBC
HCT: 40.3 % (ref 36.0–46.0)
Hemoglobin: 13.8 g/dL (ref 12.0–15.0)
MCHC: 34.3 g/dL (ref 30.0–36.0)
MCV: 87.1 fl (ref 78.0–100.0)
Platelets: 211 10*3/uL (ref 150.0–400.0)
RBC: 4.63 Mil/uL (ref 3.87–5.11)
RDW: 12.7 % (ref 11.5–15.5)
WBC: 8.3 10*3/uL (ref 4.0–10.5)

## 2018-06-26 LAB — VITAMIN B12: Vitamin B-12: 1314 pg/mL — ABNORMAL HIGH (ref 211–911)

## 2018-06-26 LAB — HEMOGLOBIN A1C: Hgb A1c MFr Bld: 6.5 % (ref 4.6–6.5)

## 2018-06-26 LAB — TSH: TSH: 2.17 u[IU]/mL (ref 0.35–4.50)

## 2018-07-08 ENCOUNTER — Ambulatory Visit (INDEPENDENT_AMBULATORY_CARE_PROVIDER_SITE_OTHER): Payer: Medicare Other | Admitting: Family Medicine

## 2018-07-08 ENCOUNTER — Other Ambulatory Visit: Payer: Self-pay

## 2018-07-08 VITALS — BP 145/68 | HR 58

## 2018-07-08 DIAGNOSIS — R748 Abnormal levels of other serum enzymes: Secondary | ICD-10-CM | POA: Diagnosis not present

## 2018-07-08 DIAGNOSIS — N390 Urinary tract infection, site not specified: Secondary | ICD-10-CM

## 2018-07-08 DIAGNOSIS — R109 Unspecified abdominal pain: Secondary | ICD-10-CM

## 2018-07-08 DIAGNOSIS — E559 Vitamin D deficiency, unspecified: Secondary | ICD-10-CM | POA: Diagnosis not present

## 2018-07-08 DIAGNOSIS — E1169 Type 2 diabetes mellitus with other specified complication: Secondary | ICD-10-CM | POA: Diagnosis not present

## 2018-07-08 DIAGNOSIS — E782 Mixed hyperlipidemia: Secondary | ICD-10-CM

## 2018-07-08 DIAGNOSIS — I10 Essential (primary) hypertension: Secondary | ICD-10-CM

## 2018-07-08 DIAGNOSIS — E7800 Pure hypercholesterolemia, unspecified: Secondary | ICD-10-CM | POA: Diagnosis not present

## 2018-07-08 DIAGNOSIS — R519 Headache, unspecified: Secondary | ICD-10-CM

## 2018-07-08 DIAGNOSIS — R51 Headache: Secondary | ICD-10-CM

## 2018-07-08 DIAGNOSIS — E669 Obesity, unspecified: Secondary | ICD-10-CM

## 2018-07-08 NOTE — Assessment & Plan Note (Addendum)
Supplement and monitor 

## 2018-07-08 NOTE — Progress Notes (Signed)
Virtual Visit via Telephone Note  I connected with [name] on 07/08/18 at  8:40 AM EDT by a phone enabled telemedicine application and verified that I am speaking with the correct person using two identifiers.  Location: Patient: home Provider: office   I discussed the limitations of evaluation and management by telemedicine and the availability of in person appointments. The patient expressed understanding and agreed to proceed. Magdalene Molly, CMA was able to get patient set up on phone visit after failing to get video set up    Subjective:    [name], female    DOB: 29-Oct-1940, 78 y.o.   MRN: [id number]  No chief complaint on file.   HPI Patient is in today for follow up on chronic medical concerns including hypertension, diabetes, reflux and more. She is managing to stay in during quarantine and her family is bringing her groceries. She admits to feeling some anxiety and loneliness but it is not overwhelming. No recent febrile illness or hospitalizations. She continues to note discomfort when she lies down at night on her right side. Her GI work up was negative but her pain persisted when she lied down. Pain is uppper abdomen/lower chest wall. Her bowels are moving well. No bloody or tarry stool. Appetite is good. Change of position resolves the pain. Denies palp/SOB/HA/congestion/fevers or GU c/o. Taking meds as prescribed  Past Medical History:  Diagnosis Date  . Abdominal pain in female 03/18/2010   Qualifier: Diagnosis of  By: Carlean Purl MD, Dimas Millin Anemia 06/08/2014  . Anxiety   . Arthritis    Spinal Osteoarthritis  . Cancer ([place])   . Carcinoid tumor of stomach   . Cataract   . Chest pain    Myoview 12/15 no ischemia.  . Chronic kidney disease    Left kidney smaller than right kidney  . Constipation 11/21/2016  . 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 Diabetic  peripheral neuropathy ([place]) 10/29/2013  . Encounter for preventative adult health care exam with abnormal findings 09/14/2013  . Esophageal reflux   . Gastric polyp    Fundic Gland  . Gastroparesis   . Headache(784.0)   . Heart murmur    Echocardiogram 2/11: EF 60-65%, mild LAE, grade 1 diastolic dysfunction, aortic valve sclerosis, mean gradient 9 mm of mercury, PASP 34  . Hematuria 03/16/2016  . Iron deficiency anemia, unspecified   . 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 Leg swelling    bilateral  . Neck pain 04/22/2015  . PONV (postoperative nausea and vomiting)    pt states only needs small amount of anesthesia  . PSVT (paroxysmal supraventricular tachycardia) ([place])   . Pure hypercholesterolemia   . 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 Stroke ([place])    tia, 2014  . 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 Type II or unspecified type diabetes mellitus without mention of complication, not stated as uncontrolled   . Unspecified essential hypertension   . Unspecified hereditary and idiopathic peripheral neuropathy 10/29/2013    Past Surgical History:  Procedure Laterality Date  . CHOLECYSTECTOMY  1993  . COLONOSCOPY  11/11/2010   diverticulosis  . DILATATION & CURRETTAGE/HYSTEROSCOPY WITH RESECTOCOPE N/A 02/25/2013   Procedure: Attempted hysteroscopy with uterine perforation;  Surgeon: Jamey Reas de Berton Lan, MD;  Location: [facility];  Service: Gynecology;  Laterality: N/A;  . ESOPHAGOGASTRODUODENOSCOPY  08/29/2010; 09/15/2010   Carcinoid  tumor less than 1 cm in July 2012 not seen in August 2012 , gastritis, fundic gland polyps  . ESOPHAGOGASTRODUODENOSCOPY  05/16/2011  . ESOPHAGOGASTRODUODENOSCOPY  06/14/2012  . EUS  12/15/2010   Procedure: UPPER ENDOSCOPIC ULTRASOUND (EUS) LINEAR;  Surgeon: Owens Loffler, MD;  Location: [facility];  Service: Endoscopy;  Laterality: N/A;  . EYE SURGERY Bilateral    Bi lateral cateracts and bi lateral laser  . LAPAROSCOPY N/A 02/25/2013   Procedure: Cystoscopy and laparoscopy  with fulguration of uterine serosa;  Surgeon: Jamey Reas de Berton Lan, MD;  Location: [facility];  Service: Gynecology;  Laterality: N/A;  . TONSILLECTOMY      Family History  Problem Relation Age of Onset  . Diabetes Mother   . Stroke Father        deceased age 77  . Heart disease Sister        deceased MI age 64  . Heart disease Brother        deceased MI age 31  . Diabetes Maternal Grandmother   . Hypertension Paternal Grandmother   . Diabetes Sister   . Heart disease Sister   . Hypertension Sister   . Hyperlipidemia Sister   . Diabetes Sister   . Heart disease Sister   . Hypertension Sister   . Hyperlipidemia Sister   . Diabetes Brother   . Heart disease Brother   . Hypertension Brother   . Hyperlipidemia Brother   . Colon cancer Neg Hx   . Esophageal cancer Neg Hx   . Stomach cancer Neg Hx   . Rectal cancer Neg Hx     Social History   Socioeconomic History  . Marital status: Widowed    Spouse name: Not on file  . Number of children: 0  . Years of education: college  . Highest education level: Not on file  Occupational History  . Occupation: retired  Scientific laboratory technician  . Financial resource strain: Not on file  . Food insecurity:    Worry: Not on file    Inability: Not on file  . Transportation needs:    Medical: Not on file    Non-medical: Not on file  Tobacco Use  . Smoking status: Never Smoker  . Smokeless tobacco: Never Used  . Tobacco comment: Never used tobacco  Substance and Sexual Activity  . Alcohol use: No    Alcohol/week: 0.0 standard drinks  . Drug use: No  . Sexual activity: Never    Partners: Male    Birth control/protection: Post-menopausal    Comment: lives alone, no dietary restrictions except avoid fresh veg, fruit, whole grains  Lifestyle  . Physical activity:    Days per week: Not on file    Minutes per session: Not on file  . Stress: Not on file  Relationships  . Social connections:    Talks on phone: Not on file     Gets together: Not on file    Attends religious service: Not on file    Active member of club or organization: Not on file    Attends meetings of clubs or organizations: Not on file    Relationship status: Not on file  . Intimate partner violence:    Fear of current or ex partner: Not on file    Emotionally abused: Not on file    Physically abused: Not on file    Forced sexual activity: Not on file  Other Topics Concern  . 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   Patient is right-handed.   Patient has a BA degree.   One caffeine drink daily     Outpatient Medications Prior to Visit  Medication Sig Dispense Refill  . amLODipine (NORVASC) 5 MG tablet TAKE 1 TABLET BY MOUTH  DAILY 90 tablet 3  . aspirin 81 MG tablet Take 81 mg by mouth daily.    . cholecalciferol (VITAMIN D) 1000 UNITS tablet Take 1,000 Units by mouth daily.      Marland Kitchen dicyclomine (BENTYL) 10 MG capsule TAKE ONE CAPSULE BY MOUTH 4 TIMES DAILY AS NEEDED 120 capsule 1  . esomeprazole (NEXIUM) 40 MG capsule Take 1 capsule (40 mg total) by mouth daily before breakfast. 90 capsule 6  . FOLBIC 2.5-25-2 MG TABS tablet TAKE 1 TABLET BY MOUTH EVERY DAY 90 tablet 1  . glucose blood (ONE TOUCH ULTRA TEST) test strip USE 2 TIMES DAILY FOR DIABETIC TESTING 700 each 2  . hydrALAZINE (APRESOLINE) 10 MG tablet Take 1 tablet (10 mg total) by mouth 2 (two) times daily. 60 tablet 3  . Lancets (ONETOUCH DELICA PLUS [id number]) MISC USE 2 TIMES DAILY FOR DIABETIC TESTING 100 each 1  . losartan (COZAAR) 50 MG tablet TAKE 1 TABLET BY MOUTH TWO  TIMES DAILY 180 tablet 1  . metFORMIN (GLUCOPHAGE-XR) 500 MG 24 hr tablet Take 1 tablet (500 mg total) by mouth 3 (three) times daily with meals. 270 tablet 1  . metoCLOPramide (REGLAN) 5 MG tablet TAKE 1 TABLET BY MOUTH TWO  TIMES DAILY 180 tablet 0  . metoprolol tartrate (LOPRESSOR) 50 MG tablet TAKE 1 AND 1/2 TABLETS BY  MOUTH  TWICE DAILY 270 tablet 1  . ondansetron (ZOFRAN) 4 MG tablet Take 4 mg by mouth every 4 (four) hours as needed. For nausea      . potassium chloride (K-DUR,KLOR-CON) 10 MEQ tablet TAKE 1 TABLET BY MOUTH  DAILY 90 tablet 2  . rosuvastatin (CRESTOR) 10 MG tablet TAKE 1 TABLET BY MOUTH  DAILY 90 tablet 2  . sodium chloride (OCEAN) 0.65 % SOLN nasal spray Place 1 spray into both nostrils as needed for congestion. 15 mL 0   No facility-administered medications prior to visit.     Allergies  Allergen Reactions  . Tramadol Other (See Comments)    Dizziness         Objective:    Physical Exam Unable to obtain via telephone  BP (!) 145/68   Pulse (!) 58   LMP 02/07/1992  Wt Readings from Last 3 Encounters:  06/11/18 169 lb (76.7 kg)  04/12/18 172 lb (78 kg)  04/01/18 172 lb (78 kg)    Diabetic Foot Exam - Simple   No data filed     Lab Results  Component Value Date   WBC 8.3 06/26/2018   HGB 13.8 06/26/2018   HCT 40.3 06/26/2018   PLT 211.0 06/26/2018   GLUCOSE 109 (H) 06/26/2018   CHOL 279 (H) 06/26/2018   TRIG 144.0 06/26/2018   HDL 75.80 06/26/2018   LDLDIRECT 123.7 04/13/2011   LDLCALC 175 (H) 06/26/2018   ALT 21 06/26/2018   AST 22 06/26/2018   NA 136 06/26/2018   K 3.8 06/26/2018   CL 97 06/26/2018   CREATININE 0.67 06/26/2018   BUN 16 06/26/2018   CO2 28 06/26/2018   TSH 2.17 06/26/2018   INR 0.88 01/16/2011   HGBA1C 6.5 06/26/2018   MICROALBUR 1.6 11/13/2014  Lab Results  Component Value Date   TSH 2.17 06/26/2018   Lab Results  Component Value Date   WBC 8.3 06/26/2018   HGB 13.8 06/26/2018   HCT 40.3 06/26/2018   MCV 87.1 06/26/2018   PLT 211.0 06/26/2018   Lab Results  Component Value Date   NA 136 06/26/2018   K 3.8 06/26/2018   CHLORIDE 101 07/08/2015   CO2 28 06/26/2018   GLUCOSE 109 (H) 06/26/2018   BUN 16 06/26/2018   CREATININE 0.67 06/26/2018   BILITOT 0.9 06/26/2018   ALKPHOS 54 06/26/2018   AST 22 06/26/2018   ALT 21  06/26/2018   PROT 7.2 06/26/2018   ALBUMIN 4.5 06/26/2018   CALCIUM 9.6 06/26/2018   ANIONGAP 9 12/26/2017   EGFR 75 (L) 07/08/2015   GFR 85.29 06/26/2018   Lab Results  Component Value Date   CHOL 279 (H) 06/26/2018   Lab Results  Component Value Date   HDL 75.80 06/26/2018   Lab Results  Component Value Date   LDLCALC 175 (H) 06/26/2018   Lab Results  Component Value Date   TRIG 144.0 06/26/2018   Lab Results  Component Value Date   CHOLHDL 4 06/26/2018   Lab Results  Component Value Date   HGBA1C 6.5 06/26/2018       Assessment & Plan:   Problem List Items Addressed This Visit    Diabetes mellitus type 2 in obese (H[facility]) (Chronic)    hgba1c acceptable, minimize simple carbs. Increase exercise as tolerated. Continue current meds      Relevant Orders   Hemoglobin A1c   Essential hypertension (Chronic)    no changes to meds. Encouraged heart healthy diet such as the DASH diet and exercise as tolerated. Encouraged to check vitals weekly and record for review      Relevant Orders   CBC   Comprehensive metabolic panel   TSH   HYPERCHOLESTEROLEMIA    Encouraged heart healthy diet, increase exercise, avoid trans fats, consider a krill oil cap daily      Abdominal pain in female    Worse when lying on right side at night OK most of the day, bowels moving well and no urinary symptoms. Appetite is good. Offered an ultrasound of abdomen but declined for now she will let us know fi she is willing to proceed.       Vitamin D deficiency    Supplement and monitor      Relevant Orders   VITAMIN D 25 Hydroxy (Vit-D Deficiency, Fractures)   Recurrent UTI    No recent symptoms      Headache    Gets a headache about weekly but resolves with out any intervention. Neck pain seems to contribute. Also unable to sleep on right side without getting uncomfortable       Other Visit Diagnoses    Elevated vitamin B12 level    -  Primary   Relevant Orders   Vitamin B12    Mixed hyperlipidemia       Relevant Orders   Lipid panel      I am having Alonda N. Jons maintain her cholecalciferol, ondansetron, aspirin, dicyclomine, metFORMIN, amLODipine, glucose blood, esomeprazole, hydrALAZINE, rosuvastatin, potassium chloride, sodium chloride, losartan, Folbic, OneTouch Delica Plus [id number], metoCLOPramide, and metoprolol tartrate.  No orders of the defined types were placed in this encounter.    I discussed the assessment and treatment plan with the patient. The patient was provided an opportunity to ask questions and all were  answered. The patient agreed with the plan and demonstrated an understanding of the instructions.   The patient was advised to call back or seek an in-person evaluation if the symptoms worsen or if the condition fails to improve as anticipated.  I provided 25 minutes of non-face-to-face time during this encounter.   Penni Homans, MD

## 2018-07-08 NOTE — Assessment & Plan Note (Signed)
Encouraged heart healthy diet, increase exercise, avoid trans fats, consider a krill oil cap daily 

## 2018-07-08 NOTE — Assessment & Plan Note (Signed)
No recent symptoms

## 2018-07-08 NOTE — Assessment & Plan Note (Addendum)
Worse when lying on right side at night OK most of the day, bowels moving well and no urinary symptoms. Appetite is good. Offered an ultrasound of abdomen but declined for now she will let us know fi she is willing to proceed.

## 2018-07-08 NOTE — Assessment & Plan Note (Signed)
hgba1c acceptable, minimize simple carbs. Increase exercise as tolerated. Continue current meds 

## 2018-07-08 NOTE — Assessment & Plan Note (Signed)
Gets a headache about weekly but resolves with out any intervention. Neck pain seems to contribute. Also unable to sleep on right side without getting uncomfortable

## 2018-07-08 NOTE — Assessment & Plan Note (Addendum)
no changes to meds. Encouraged heart healthy diet such as the DASH diet and exercise as tolerated. Encouraged to check vitals weekly and record for review

## 2018-07-31 ENCOUNTER — Other Ambulatory Visit: Payer: Self-pay | Admitting: Cardiology

## 2018-07-31 ENCOUNTER — Other Ambulatory Visit: Payer: Self-pay | Admitting: Family Medicine

## 2018-08-05 NOTE — Telephone Encounter (Signed)
Opened in error

## 2018-08-27 ENCOUNTER — Telehealth: Payer: Self-pay | Admitting: Family Medicine

## 2018-08-27 NOTE — Telephone Encounter (Signed)
OK to change her sig on her test strips to tid prn for labile blood sugars. Confirm if she wants 90 day supply with 1 rf or 30 day supply with 5

## 2018-08-27 NOTE — Telephone Encounter (Signed)
Pt would like a refill on her glucose blood (ONE TOUCH ULTRA TEST) test strip  But she would like to know if Dr. Charlett Blake can change Rx to 3x's daily instead of 2x's daily/ please advise and send to   [place], [place] - [address] [phone] (Phone) [phone] (Fax)

## 2018-08-27 NOTE — Telephone Encounter (Signed)
Please advise 

## 2018-08-28 MED ORDER — GLUCOSE BLOOD VI STRP: ORAL_STRIP | 2 refills | Status: DC

## 2018-08-28 MED FILL — ONE TOUCH ULTRA TEST STRIPS: 30 days supply | Qty: 100 | Fill #0

## 2018-08-28 NOTE — Telephone Encounter (Signed)
Sent medications in.

## 2018-08-30 MED FILL — ONETOUCH DELICA PLUS LANCET: 50 days supply | Qty: 100 | Fill #0

## 2018-09-04 ENCOUNTER — Telehealth: Payer: Self-pay

## 2018-09-04 MED ORDER — GLUCOSE BLOOD VI STRP: ORAL_STRIP | 12 refills | Status: DC

## 2018-09-04 MED ORDER — BLOOD GLUCOSE METER KIT: PACK | 0 refills | Status: DC

## 2018-09-04 MED ORDER — ONETOUCH ULTRASOFT LANCETS MISC: 12 refills | Status: DC

## 2018-09-04 NOTE — Telephone Encounter (Signed)
Copied from [place]. Topic: General - Other >> Aug 30, 2018 12:34 PM [name] wrote: Reason for CRM: Patient called to say that she is having [name] hard time getting the lancets to go with the devise that she have. She states that the pharmacy receives the Rx from Dr Charlett Blake but the lancets are wrong. She is asking for [name] new everything Glucometer and everything would like the One touch Delica. Or [name] lancing device that is one touch delicia. Please advise patient is almost out of lancets.   Updated patients meds and sent in new rx

## 2018-09-16 ENCOUNTER — Other Ambulatory Visit: Payer: Self-pay

## 2018-09-16 ENCOUNTER — Telehealth: Payer: Self-pay | Admitting: Cardiology

## 2018-09-16 NOTE — Telephone Encounter (Signed)
You can schedule her for follow up in a couple of months.  Agree with suggestions.

## 2018-09-16 NOTE — Telephone Encounter (Signed)
Spoke with pt, her bp yesterday was 220/97 but she reports not taking the hydralazine because she was told to take it if she needed it. Today her bp is 141/80, 142/80 and 147/71. Encouraged the patient to take her hydralazine 10 mg twice daily as prescribed. Patient voiced understanding, she wants dr hochrein to know about her bp and she is wondering why she is not seeing him for a year. Will forward to dr hochrein to review and advise

## 2018-09-16 NOTE — Telephone Encounter (Signed)
New Message   Pt c/o BP issue:  1. What are your last 5 BP readings? 220/97 2. Are you having any other symptoms (ex. Dizziness, headache, blurred vision, passed out) headache  3. What is your medication issue? No

## 2018-09-17 ENCOUNTER — Encounter: Payer: Self-pay | Admitting: Medical

## 2018-09-17 ENCOUNTER — Other Ambulatory Visit (INDEPENDENT_AMBULATORY_CARE_PROVIDER_SITE_OTHER): Payer: Medicare Other

## 2018-09-17 ENCOUNTER — Ambulatory Visit (INDEPENDENT_AMBULATORY_CARE_PROVIDER_SITE_OTHER): Payer: Medicare Other | Admitting: Medical

## 2018-09-17 VITALS — BP 139/62 | HR 68 | Temp 97.9°F | Resp 16 | Ht 64.0 in | Wt 170.4 lb

## 2018-09-17 DIAGNOSIS — R519 Headache, unspecified: Secondary | ICD-10-CM

## 2018-09-17 DIAGNOSIS — I10 Essential (primary) hypertension: Secondary | ICD-10-CM | POA: Diagnosis not present

## 2018-09-17 DIAGNOSIS — E782 Mixed hyperlipidemia: Secondary | ICD-10-CM

## 2018-09-17 DIAGNOSIS — H9202 Otalgia, left ear: Secondary | ICD-10-CM | POA: Diagnosis not present

## 2018-09-17 DIAGNOSIS — M792 Neuralgia and neuritis, unspecified: Secondary | ICD-10-CM

## 2018-09-17 DIAGNOSIS — E1169 Type 2 diabetes mellitus with other specified complication: Secondary | ICD-10-CM

## 2018-09-17 DIAGNOSIS — R748 Abnormal levels of other serum enzymes: Secondary | ICD-10-CM

## 2018-09-17 DIAGNOSIS — R51 Headache: Secondary | ICD-10-CM | POA: Diagnosis not present

## 2018-09-17 DIAGNOSIS — R591 Generalized enlarged lymph nodes: Secondary | ICD-10-CM | POA: Diagnosis not present

## 2018-09-17 DIAGNOSIS — E559 Vitamin D deficiency, unspecified: Secondary | ICD-10-CM

## 2018-09-17 DIAGNOSIS — E669 Obesity, unspecified: Secondary | ICD-10-CM | POA: Diagnosis not present

## 2018-09-17 LAB — CBC
HCT: 39.9 % (ref 36.0–46.0)
Hemoglobin: 13.5 g/dL (ref 12.0–15.0)
MCHC: 33.9 g/dL (ref 30.0–36.0)
MCV: 86.8 fl (ref 78.0–100.0)
Platelets: 226 10*3/uL (ref 150.0–400.0)
RBC: 4.6 Mil/uL (ref 3.87–5.11)
RDW: 12.4 % (ref 11.5–15.5)
WBC: 7.4 10*3/uL (ref 4.0–10.5)

## 2018-09-17 LAB — HEMOGLOBIN A1C: Hgb A1c MFr Bld: 6.4 % (ref 4.6–6.5)

## 2018-09-17 LAB — SEDIMENTATION RATE: Sed Rate: 23 mm/hr (ref 0–30)

## 2018-09-17 MED ORDER — ONETOUCH ULTRALINK W/DEVICE KIT: PACK | 0 refills | Status: DC

## 2018-09-17 MED ORDER — HYDRALAZINE HCL 10 MG PO TABS: 10.0000 mg | ORAL_TABLET | Freq: Two times a day (BID) | ORAL | 3 refills | Status: DC

## 2018-09-17 MED ORDER — GABAPENTIN 100 MG PO CAPS: 100.0000 mg | ORAL_CAPSULE | Freq: Three times a day (TID) | ORAL | 0 refills | Status: DC

## 2018-09-17 MED FILL — ONETOUCH ULTRA 2 W/DEVICE K: W/DEVICE | 30 days supply | Qty: 1 | Fill #0

## 2018-09-17 MED FILL — GABAPENTIN 100 MG CAPSULE: 100 | 30 days supply | Qty: 90 | Fill #0

## 2018-09-17 NOTE — Patient Instructions (Addendum)
For your left submandibular area discomfort will get [place] of your neck/lymph node area. Please go downstairs to get scheduled.  For possible trigeminal neuralgia will rx gabapentin. Rx states three times a day. But start out just one tab at night for one week. Then advance to twice a day 2nd week. Then 3rd week take three times a day(1 every 8 hours).  Will get sed rate due to occasional temporal area pain.  Please schedule with Dr. Charlett Blake for 2 week follow up.  Please get future labs your pcp placed with exception of lipid panel since not fasting.

## 2018-09-17 NOTE — Progress Notes (Signed)
Subjective:    [name], female    DOB: [birth year], 78 y.o.   MRN: [id number]  HPI  Pt has recent past 2 days of left side ear discomfort, left submandibular region irritation and some intermittent pain left side of face.  I have seen patient for this in the past before. Pt has seen by specialist in the past. Pt states ENT and throat MD told her nothing was wrong per pt.   Specialist note A & P.  Assessment and Plan Problem List   Other  Chronic throat clearing  Current Assessment & Plan  Concern over cervical adenopathy. She has chronic left ear discomfort associated with chronic throat awareness with throat clearing and sensation of swelling in her throat. She was treated with antibiotics and apparently got oral thrush after that. She was treated with that but continues with ear pain and throat symptoms. I've seen her in the past for suspected referred ear pain. She had a CT of her neck done relatively recently that is reviewed and shows no cervical adenopathy or masses. EXAMINATION shows normal external canals and tympanic membranes. Oral cavity oropharynx shows no concerning lesions of the mucosa. Indirect laryngoscopy with a good look shows no concerning lesions the hypopharynx or larynx. Neck shows no palpable adenopathy or masses. PLAN: Reassured her ear and throat are okay. Ear pain seems to be referred from musculoskeletal discomfort. We discussed a do ear self physical therapy website for her to refer to. Also discussed occult reflux contributing to her throat awareness symptoms. Currently she is using Nexium 20 mg a day. Will increase to 40 in the morning for the next month. She will give [place] a follow-up phone regarding her response to this therapy.  Pt does not think symptoms reflux related. She states took nexium and did not help when increaed to 40 mg.   Pt states she still has sharp pain and shooting pain at time around her face. Sensitive to cold  air. Example cold from refrigerator and sensitive to cold air from [facility] vents. Pt also mentions hx of temporal area pain as well.(Also see last note from visit with me)   Review of Systems  Constitutional: Negative for chills, fatigue and fever.  HENT: Positive for ear pain. Negative for congestion, hearing loss, nosebleeds, postnasal drip, rhinorrhea, sinus pressure, sinus pain, sneezing and sore throat.        Hx of thrush but don't see today on exam.  Respiratory: Negative for chest tightness, shortness of breath, wheezing and stridor.   Cardiovascular: Negative for chest pain and palpitations.  Gastrointestinal: Negative for abdominal pain.  Genitourinary: Negative for dysuria.  Musculoskeletal: Negative for back pain and neck pain.  Neurological: Negative for facial asymmetry.       Left side face pain. Sensitive to cold at times.   Hematological: Negative for adenopathy. Does not bruise/bleed easily.       Pt always feels as if she can feel left submandibular promientally.  Psychiatric/Behavioral: Negative for behavioral problems and confusion.    Past Medical History:  Diagnosis Date  . Abdominal pain in female 03/18/2010   Qualifier: Diagnosis of  By: Carlean Purl MD, Dimas Millin Anemia 06/08/2014  . Anxiety   . Arthritis    Spinal Osteoarthritis  . Cancer ([place])   . Carcinoid tumor of stomach   . Cataract   . Chest pain    Myoview 12/15 no ischemia.  . 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 Constipation 11/21/2016  . 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 Diabetic peripheral neuropathy ([place]) 10/29/2013  . Encounter for preventative adult health care exam with abnormal findings 09/14/2013  . Esophageal reflux   . Gastric polyp    Fundic Gland  . Gastroparesis   . Headache(784.0)   . Heart murmur    Echocardiogram 2/11: EF 60-65%, mild LAE, grade 1 diastolic dysfunction, aortic valve  sclerosis, mean gradient 9 mm of mercury, PASP 34  . Hematuria 03/16/2016  . Iron deficiency anemia, unspecified   . 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 Leg swelling    bilateral  . Neck pain 04/22/2015  . PONV (postoperative nausea and vomiting)    pt states only needs small amount of anesthesia  . PSVT (paroxysmal supraventricular tachycardia) ([place])   . Pure hypercholesterolemia   . 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 Stroke ([place])    tia, 2014  . 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 Type II or unspecified type diabetes mellitus without mention of complication, not stated as uncontrolled   . Unspecified essential hypertension   . Unspecified hereditary and idiopathic peripheral neuropathy 10/29/2013     Social History   Socioeconomic History  . Marital status: Widowed    Spouse name: Not on file  . Number of children: 0  . Years of education: college  . Highest education level: Not on file  Occupational History  . Occupation: retired  Scientific laboratory technician  . Financial resource strain: Not on file  . Food insecurity    Worry: Not on file    Inability: Not on file  . Transportation needs    Medical: Not on file    Non-medical: Not on file  Tobacco Use  . Smoking status: Never Smoker  . Smokeless tobacco: Never Used  . Tobacco comment: Never used tobacco  Substance and Sexual Activity  . Alcohol use: No    Alcohol/week: 0.0 standard drinks  . Drug use: No  . Sexual activity: Never    Partners: Male    Birth control/protection: Post-menopausal    Comment: lives alone, no dietary restrictions except avoid fresh veg, fruit, whole grains  Lifestyle  . Physical activity    Days per week: Not on file    Minutes per session: Not on file  . Stress: Not on file  Relationships  . Social Herbalist on phone: Not on file    Gets together: Not on file    Attends religious service: Not on file    Active member of club or organization: Not on file    Attends meetings of clubs or organizations: Not on  file    Relationship status: Not on file  . Intimate partner violence    Fear of current or ex partner: Not on file    Emotionally abused: Not on file    Physically abused: Not on file    Forced sexual activity: Not on file  Other Topics Concern  . Not on file  Social History Narrative   Patient was married ([name]) - widow   Patient does not have any children.   Patient is right-handed.   Patient has a BA degree.   One caffeine drink daily     Past Surgical History:  Procedure Laterality Date  . CHOLECYSTECTOMY  1993  . COLONOSCOPY  11/11/2010   diverticulosis  . DILATATION &  CURRETTAGE/HYSTEROSCOPY WITH RESECTOCOPE N/A 02/25/2013   Procedure: Attempted hysteroscopy with uterine perforation;  Surgeon: Jamey Reas de Berton Lan, MD;  Location: [facility];  Service: Gynecology;  Laterality: N/A;  . ESOPHAGOGASTRODUODENOSCOPY  08/29/2010; 09/15/2010   Carcinoid tumor less than 1 cm in July 2012 not seen in August 2012 , gastritis, fundic gland polyps  . ESOPHAGOGASTRODUODENOSCOPY  05/16/2011  . ESOPHAGOGASTRODUODENOSCOPY  06/14/2012  . EUS  12/15/2010   Procedure: UPPER ENDOSCOPIC ULTRASOUND (EUS) LINEAR;  Surgeon: Owens Loffler, MD;  Location: [facility];  Service: Endoscopy;  Laterality: N/A;  . EYE SURGERY Bilateral    Bi lateral cateracts and bi lateral laser  . LAPAROSCOPY N/A 02/25/2013   Procedure: Cystoscopy and laparoscopy with fulguration of uterine serosa;  Surgeon: Jamey Reas de Berton Lan, MD;  Location: [facility];  Service: Gynecology;  Laterality: N/A;  . TONSILLECTOMY      Family History  Problem Relation Age of Onset  . Diabetes Mother   . Stroke Father        deceased age 80  . Heart disease Sister        deceased MI age 26  . Heart disease Brother        deceased MI age 98  . Diabetes Maternal Grandmother   . Hypertension Paternal Grandmother   . Diabetes Sister   . Heart disease Sister   . Hypertension Sister   . Hyperlipidemia Sister   .  Diabetes Sister   . Heart disease Sister   . Hypertension Sister   . Hyperlipidemia Sister   . Diabetes Brother   . Heart disease Brother   . Hypertension Brother   . Hyperlipidemia Brother   . Colon cancer Neg Hx   . Esophageal cancer Neg Hx   . Stomach cancer Neg Hx   . Rectal cancer Neg Hx     Allergies  Allergen Reactions  . Tramadol Other (See Comments)    Dizziness     Current Outpatient Medications on File Prior to Visit  Medication Sig Dispense Refill  . amLODipine (NORVASC) 5 MG tablet TAKE 1 TABLET BY MOUTH  DAILY 90 tablet 3  . aspirin 81 MG tablet Take 81 mg by mouth daily.    . blood glucose meter kit and supplies Dispense based on patient and insurance preference.Check BS TID and PRN Dx:E119 1 each 0  . cholecalciferol (VITAMIN D) 1000 UNITS tablet Take 1,000 Units by mouth daily.      Marland Kitchen dicyclomine (BENTYL) 10 MG capsule TAKE ONE CAPSULE BY MOUTH 4 TIMES DAILY AS NEEDED 120 capsule 1  . esomeprazole (NEXIUM) 40 MG capsule Take 1 capsule (40 mg total) by mouth daily before breakfast. 90 capsule 6  . FOLBIC 2.5-25-2 MG TABS tablet TAKE 1 TABLET BY MOUTH EVERY DAY 90 tablet 1  . glucose blood test strip Use as instructedDispense based on patient and insurance preference.Check BS TID and PRN Dx:E119 100 each 12  . Lancets (ONETOUCH ULTRASOFT) lancets Use as instructedDispense based on patient and insurance preference.Check BS TID and PRN Dx:E119 100 each 12  . losartan (COZAAR) 50 MG tablet TAKE 1 TABLET BY MOUTH TWO  TIMES DAILY 180 tablet 1  . metFORMIN (GLUCOPHAGE-XR) 500 MG 24 hr tablet TAKE 1 TABLET BY MOUTH 3  TIMES DAILY WITH MEALS 270 tablet 1  . metoCLOPramide (REGLAN) 5 MG tablet TAKE 1 TABLET BY MOUTH TWO  TIMES DAILY 180 tablet 0  . metoprolol tartrate (LOPRESSOR) 50 MG tablet TAKE  1 AND 1/2 TABLETS BY  MOUTH TWICE DAILY 270 tablet 1  . ondansetron (ZOFRAN) 4 MG tablet Take 4 mg by mouth every 4 (four) hours as needed. For nausea      . potassium chloride  (K-DUR) 10 MEQ tablet TAKE 1 TABLET BY MOUTH  DAILY 90 tablet 2  . rosuvastatin (CRESTOR) 10 MG tablet TAKE 1 TABLET BY MOUTH  DAILY 90 tablet 2  . sodium chloride (OCEAN) 0.65 % SOLN nasal spray Place 1 spray into both nostrils as needed for congestion. 15 mL 0   No current facility-administered medications on file prior to visit.     BP 139/62   Pulse 68   Temp 97.9 F (36.6 C) (Oral)   Resp 16   Ht 5' 4"  (1.626 m)   Wt 170 lb 6.4 oz (77.3 kg)   LMP 02/07/1992   SpO2 99%   BMI 29.25 kg/m       Objective:   Physical Exam  General Mental Status- Alert. General Appearance- Not in acute distress.   Skin General: Color- Normal Color. Moisture- Normal Moisture.  Neck Carotid Arteries- Normal color. Moisture- Normal Moisture. No carotid bruits. No JVD.  Chest and Lung Exam Auscultation: Breath Sounds:-Normal.  Cardiovascular Auscultation:Rythm- Regular. Murmurs & Other Heart Sounds:Auscultation of the heart reveals- No Murmurs.  Abdomen Inspection:-Inspeection Normal. Palpation/Percussion:Note:No mass. Palpation and Percussion of the abdomen reveal- Non Tender, Non Distended + BS, no rebound or guarding.    Neurologic Cranial Nerve exam:- CN III-XII intact(No nystagmus), symmetric smile. Strength:- 5/5 equal and symmetric strength both upper and lower extremities.   HEENT Head- Normal. Ear Auditory Canal - Left- Normal. Right - Normal.Tympanic Membrane- Left- mild pink.. Right- Normal. Eye Sclera/Conjunctiva- Left- Normal. Right- Normal. Nose & Sinuses Nasal Mucosa- Left- not  Boggy and Congested. Right-  Not  Boggy and  Congested.Bilateral maxillary and frontal sinus pressure. Mouth & Throat Lips: Upper Lip- Normal: no dryness, cracking, pallor, cyanosis, or vesicular eruption. Lower Lip-Normal: no dryness, cracking, pallor, cyanosis or vesicular eruption. Buccal Mucosa- Bilateral- No Aphthous ulcers. Oropharynx- No Discharge or Erythema. Tonsils:  Characteristics- Bilateral- No Erythema or Congestion. Size/Enlargement- Bilateral- No enlargement. Discharge- bilateral-None.  Left submandibular node palpable but not abnormal on palpation.  Skin- no lesion son left side of face. Left temporal area. No dilated veins.       Assessment & Plan:  For your left submandibular area discomfort will get [place] of your neck/lymph node area. Please go downstairs to get scheduled.  For possible trigeminal neuralgia will rx gabapentin. Rx states tid. But start out just one tab at night for one week. Then advance to twice a day 2nd week. Then 3rd week take three times a day(1 every 8 hours).  Will get sed rate due to occasional temporal area pain.  Please schedule with Dr. Charlett Blake for 2 week follow up.  Please get future labs your pcp placed with exception of lipid panel since not fasting.  Also at end pt needs new glucometer. She needs one touch ultra 2 glucometer with supplies. Check sugars 3 times a day.   25 + minutes spent with pt today. Counseled on plan going forward [employer], [employer]

## 2018-09-17 NOTE — Addendum Note (Signed)
Addended by: Caffie Pinto on: 09/17/2018 03:07 PM   Modules accepted: Orders

## 2018-09-18 LAB — COMPREHENSIVE METABOLIC PANEL
ALT: 24 U/L (ref 0–35)
AST: 19 U/L (ref 0–37)
Albumin: 4.6 g/dL (ref 3.5–5.2)
Alkaline Phosphatase: 57 U/L (ref 39–117)
BUN: 18 mg/dL (ref 6–23)
CO2: 25 mEq/L (ref 19–32)
Calcium: 10.2 mg/dL (ref 8.4–10.5)
Chloride: 97 mEq/L (ref 96–112)
Creatinine, Ser: 0.69 mg/dL (ref 0.40–1.20)
GFR: 82.4 mL/min (ref >60.00)
Glucose, Bld: 156 mg/dL — ABNORMAL HIGH (ref 70–99)
Potassium: 4 mEq/L (ref 3.5–5.1)
Sodium: 133 mEq/L — ABNORMAL LOW (ref 135–145)
Total Bilirubin: 0.7 mg/dL (ref 0.2–1.2)
Total Protein: 7.5 g/dL (ref 6.0–8.3)

## 2018-09-18 LAB — VITAMIN D 25 HYDROXY (VIT D DEFICIENCY, FRACTURES): VITD: 48.66 ng/mL (ref 30.00–100.00)

## 2018-09-18 LAB — TSH: TSH: 1.66 u[IU]/mL (ref 0.35–4.50)

## 2018-09-18 LAB — VITAMIN B12: Vitamin B-12: 467 pg/mL (ref 211–911)

## 2018-09-18 NOTE — Telephone Encounter (Signed)
Spoke with pt, she has been taking the hydralazine as prescribed and her bp today was 136/71. Follow up scheduled in 2 months.

## 2018-09-19 ENCOUNTER — Other Ambulatory Visit: Payer: Self-pay | Admitting: *Deleted

## 2018-09-19 DIAGNOSIS — E871 Hypo-osmolality and hyponatremia: Secondary | ICD-10-CM

## 2018-09-20 MED FILL — ONE TOUCH ULTRA TEST STRIPS: 30 days supply | Qty: 100 | Fill #1

## 2018-09-24 ENCOUNTER — Other Ambulatory Visit: Payer: Self-pay

## 2018-09-24 NOTE — Patient Outreach (Signed)
[facility]) Care Management  09/24/2018  [name] 02/28/1940 [id number]    Medication Adherence call to [name] [facility] Identifiers Verify spoke with patient she is past due on Metformin Er 500 mg patient explain she is taking 1 tablet 3 times a day patient has medication at this time but ask if we can call [facility] an order this medication [facility] will mail it out with in 5-7 days.[name] is showing past due under [place].   [place] Management Direct Dial [phone]  Fax [phone] [email or web address]

## 2018-09-25 ENCOUNTER — Telehealth: Payer: Self-pay | Admitting: Internal Medicine

## 2018-09-25 ENCOUNTER — Telehealth: Payer: Self-pay

## 2018-09-25 MED ORDER — NEXIUM 40 MG PO CPDR: 40.0000 mg | DELAYED_RELEASE_CAPSULE | Freq: Every day | ORAL | 3 refills | Status: DC

## 2018-09-25 MED ORDER — DICYCLOMINE HCL 10 MG PO CAPS: ORAL_CAPSULE | ORAL | 1 refills | Status: DC

## 2018-09-25 MED FILL — DICYCLOMINE 10 MG CAPSULE: 10 | 30 days supply | Qty: 120 | Fill #0

## 2018-09-25 NOTE — Telephone Encounter (Signed)
Pt reported that she is experiencing severe abdominal pain.

## 2018-09-25 NOTE — Telephone Encounter (Signed)
Left message for patient to call back  

## 2018-09-25 NOTE — Telephone Encounter (Signed)
I have started a prior authorization for patient's generic Nexium thru [facility] phone # [phone], Dx -K21.9, one daily. Member ID# [id number]. PA # [id number]. She has tried and failed several of the alternatives. It has been sent for pharmacy review and we will await the outcome.

## 2018-09-25 NOTE — Telephone Encounter (Signed)
Patient reports GERD flare up for the last 3 days.  Patient instructed to maintain an anti-reflux diet. Advised to avoid caffeine, mint, citrus foods/juices, tomatoes,  chocolate, NSAIDS/ASA products.  Instructed not to eat within 2 hours of exercise or bed, multiple small meals are better than 3 large meals.  Need to take PPI 30 minutes prior to 1st meal of the day. She will add a second dose of nexium in the pm.  She is having some chest pains as well she feels are related to the GERD.  She is advised that she can try dicyclomine as well.  She is asked to call back in 1 week if no improvement.

## 2018-09-26 NOTE — Telephone Encounter (Signed)
We received a fax from [facility] that her generic Nexium doesn't need a prior authorization. I have faxed this info to the pharmacy.

## 2018-10-03 ENCOUNTER — Telehealth: Payer: Self-pay | Admitting: Family Medicine

## 2018-10-03 NOTE — Telephone Encounter (Signed)
Patient stated she had a comprehensive lab a couple of weeks ago and she don't want to have the same labs if she doesn't need them and her insurance company is not going to pay for it. Please advise.

## 2018-10-08 ENCOUNTER — Other Ambulatory Visit: Payer: Medicare Other

## 2018-10-11 ENCOUNTER — Ambulatory Visit (INDEPENDENT_AMBULATORY_CARE_PROVIDER_SITE_OTHER): Payer: Medicare Other | Admitting: Family Medicine

## 2018-10-11 ENCOUNTER — Other Ambulatory Visit: Payer: Self-pay

## 2018-10-11 DIAGNOSIS — E559 Vitamin D deficiency, unspecified: Secondary | ICD-10-CM

## 2018-10-11 DIAGNOSIS — E782 Mixed hyperlipidemia: Secondary | ICD-10-CM

## 2018-10-11 DIAGNOSIS — E669 Obesity, unspecified: Secondary | ICD-10-CM

## 2018-10-11 DIAGNOSIS — D538 Other specified nutritional anemias: Secondary | ICD-10-CM

## 2018-10-11 DIAGNOSIS — M79604 Pain in right leg: Secondary | ICD-10-CM | POA: Diagnosis not present

## 2018-10-11 DIAGNOSIS — F419 Anxiety disorder, unspecified: Secondary | ICD-10-CM

## 2018-10-11 DIAGNOSIS — M25561 Pain in right knee: Secondary | ICD-10-CM

## 2018-10-11 DIAGNOSIS — E1169 Type 2 diabetes mellitus with other specified complication: Secondary | ICD-10-CM | POA: Diagnosis not present

## 2018-10-11 DIAGNOSIS — E7800 Pure hypercholesterolemia, unspecified: Secondary | ICD-10-CM

## 2018-10-11 DIAGNOSIS — I10 Essential (primary) hypertension: Secondary | ICD-10-CM | POA: Diagnosis not present

## 2018-10-11 MED ORDER — FOLBIC 2.5-25-2 MG PO TABS: ORAL_TABLET | ORAL | 1 refills | Status: DC

## 2018-10-13 DIAGNOSIS — F419 Anxiety disorder, unspecified: Secondary | ICD-10-CM | POA: Insufficient documentation

## 2018-10-13 NOTE — Assessment & Plan Note (Signed)
Tolerating statin, encouraged heart healthy diet, avoid trans fats, minimize simple carbs and saturated fats. Increase exercise as tolerated 

## 2018-10-13 NOTE — Assessment & Plan Note (Signed)
hgba1c acceptable, minimize simple carbs. Increase exercise as tolerated. Continue current meds 

## 2018-10-13 NOTE — Assessment & Plan Note (Signed)
Noting increase pain and stiffness most notably in right knee. No fall or injury. Encouraged moist heat and gentle stretching as tolerated. May try NSAIDs and prescription meds as directed and report if symptoms worsen or seek immediate care

## 2018-10-13 NOTE — Assessment & Plan Note (Signed)
Resolved. She was started on [place] many years ago. Her Vitamin B12 was noted to be high so we stopped it. Now her B12 is normal off the Folbic but she feels she is more fatigued off of it so she will try starting an over the counter Vitamin B complex.

## 2018-10-13 NOTE — Assessment & Plan Note (Signed)
Supplement and monitor 

## 2018-10-13 NOTE — Progress Notes (Signed)
Virtual Visit via phone Note  I connected with [name] on 10/11/18 at 10:00 AM EDT by a phone enabled telemedicine application and verified that I am speaking with the correct person using two identifiers.  Location: Patient: home Provider: home   I discussed the limitations of evaluation and management by telemedicine and the availability of in person appointments. The patient expressed understanding and agreed to proceed. Magdalene Molly, CMA was able to get the patient set up on visit phone   Subjective:    [name], female    DOB: 05-07-1940, 78 y.o.   MRN: [id number]  No chief complaint on file.   HPI Patient is in today for follow upon chronic medical concerns including hypertension, hyperlipidemia and anemia. She stopped the [facility] after her last blood draw due to elevated Vitamin B12 but she feels more fatigued off of it. She endorses anxiety, lack of motivation due to pandemic, she is quarantining well but feeling isolated and alone. She has not seen her family much. Denies CP/palp/SOB/HA/congestion/fevers/GI or GU c/o. Taking meds as prescribed. She is noting increased right knee pain and stiffness especially after prolonged immobility. No injury or fall. No warmth or swelling endorsed.   Past Medical History:  Diagnosis Date  . Abdominal pain in female 03/18/2010   Qualifier: Diagnosis of  By: Carlean Purl MD, Dimas Millin Anemia 06/08/2014  . Anxiety   . Arthritis    Spinal Osteoarthritis  . Cancer ([place])   . Carcinoid tumor of stomach   . Cataract   . Chest pain    Myoview 12/15 no ischemia.  . Chronic kidney disease    Left kidney smaller than right kidney  . Constipation 11/21/2016  . Diabetes mellitus type 2 in obese ([place]) 09/05/2006   Qualifier: Diagnosis of  By: Marca Ancona RMA, Lucy    . Diabetic peripheral neuropathy ([place]) 10/29/2013  . Encounter for preventative adult health care exam with abnormal findings 09/14/2013  . Esophageal reflux    . Gastric polyp    Fundic Gland  . Gastroparesis   . Headache(784.0)   . Heart murmur    Echocardiogram 2/11: EF 60-65%, mild LAE, grade 1 diastolic dysfunction, aortic valve sclerosis, mean gradient 9 mm of mercury, PASP 34  . Hematuria 03/16/2016  . Iron deficiency anemia, unspecified   . 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 Leg swelling    bilateral  . Neck pain 04/22/2015  . PONV (postoperative nausea and vomiting)    pt states only needs small amount of anesthesia  . PSVT (paroxysmal supraventricular tachycardia) ([place])   . Pure hypercholesterolemia   . 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 Stroke ([place])    tia, 2014  . 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 Type II or unspecified type diabetes mellitus without mention of complication, not stated as uncontrolled   . Unspecified essential hypertension   . Unspecified hereditary and idiopathic peripheral neuropathy 10/29/2013    Past Surgical History:  Procedure Laterality Date  . CHOLECYSTECTOMY  1993  . COLONOSCOPY  11/11/2010   diverticulosis  . DILATATION & CURRETTAGE/HYSTEROSCOPY WITH RESECTOCOPE N/A 02/25/2013   Procedure: Attempted hysteroscopy with uterine perforation;  Surgeon: Jamey Reas de Berton Lan, MD;  Location: [facility];  Service: Gynecology;  Laterality: N/A;  . ESOPHAGOGASTRODUODENOSCOPY  08/29/2010; 09/15/2010   Carcinoid tumor less than 1 cm in July 2012 not seen in August 2012 , gastritis, fundic gland polyps  . ESOPHAGOGASTRODUODENOSCOPY  05/16/2011  . ESOPHAGOGASTRODUODENOSCOPY  06/14/2012  .  EUS  12/15/2010   Procedure: UPPER ENDOSCOPIC ULTRASOUND (EUS) LINEAR;  Surgeon: Owens Loffler, MD;  Location: [facility];  Service: Endoscopy;  Laterality: N/A;  . EYE SURGERY Bilateral    Bi lateral cateracts and bi lateral laser  . LAPAROSCOPY N/A 02/25/2013   Procedure: Cystoscopy and laparoscopy with fulguration of uterine serosa;  Surgeon: Jamey Reas de Berton Lan, MD;  Location: [facility];  Service: Gynecology;  Laterality: N/A;   . TONSILLECTOMY      Family History  Problem Relation Age of Onset  . Diabetes Mother   . Stroke Father        deceased age 44  . Heart disease Sister        deceased MI age 56  . Heart disease Brother        deceased MI age 48  . Diabetes Maternal Grandmother   . Hypertension Paternal Grandmother   . Diabetes Sister   . Heart disease Sister   . Hypertension Sister   . Hyperlipidemia Sister   . Diabetes Sister   . Heart disease Sister   . Hypertension Sister   . Hyperlipidemia Sister   . Diabetes Brother   . Heart disease Brother   . Hypertension Brother   . Hyperlipidemia Brother   . Colon cancer Neg Hx   . Esophageal cancer Neg Hx   . Stomach cancer Neg Hx   . Rectal cancer Neg Hx     Social History   Socioeconomic History  . Marital status: Widowed    Spouse name: Not on file  . Number of children: 0  . Years of education: college  . Highest education level: Not on file  Occupational History  . Occupation: retired  Scientific laboratory technician  . Financial resource strain: Not on file  . Food insecurity    Worry: Not on file    Inability: Not on file  . Transportation needs    Medical: Not on file    Non-medical: Not on file  Tobacco Use  . Smoking status: Never Smoker  . Smokeless tobacco: Never Used  . Tobacco comment: Never used tobacco  Substance and Sexual Activity  . Alcohol use: No    Alcohol/week: 0.0 standard drinks  . Drug use: No  . Sexual activity: Never    Partners: Male    Birth control/protection: Post-menopausal    Comment: lives alone, no dietary restrictions except avoid fresh veg, fruit, whole grains  Lifestyle  . Physical activity    Days per week: Not on file    Minutes per session: Not on file  . Stress: Not on file  Relationships  . Social Herbalist on phone: Not on file    Gets together: Not on file    Attends religious service: Not on file    Active member of club or organization: Not on file    Attends meetings of  clubs or organizations: Not on file    Relationship status: Not on file  . Intimate partner violence    Fear of current or ex partner: Not on file    Emotionally abused: Not on file    Physically abused: Not on file    Forced sexual activity: Not on file  Other Topics Concern  . Not on file  Social History Narrative   Patient was married ([name]) - widow   Patient does not have any children.   Patient is right-handed.   Patient has a BA degree.  One caffeine drink daily     Outpatient Medications Prior to Visit  Medication Sig Dispense Refill  . amLODipine (NORVASC) 5 MG tablet TAKE 1 TABLET BY MOUTH  DAILY 90 tablet 3  . aspirin 81 MG tablet Take 81 mg by mouth daily.    . blood glucose meter kit and supplies Dispense based on patient and insurance preference.Check BS TID and PRN Dx:E119 1 each 0  . Blood Glucose Monitoring Suppl (ONETOUCH ULTRALINK) w/Device KIT Use to check blood sugars BID E11.69 1 kit 0  . cholecalciferol (VITAMIN D) 1000 UNITS tablet Take 1,000 Units by mouth daily.      Marland Kitchen dicyclomine (BENTYL) 10 MG capsule TAKE ONE CAPSULE BY MOUTH 4 TIMES DAILY AS NEEDED 120 capsule 1  . esomeprazole (NEXIUM) 40 MG capsule Take 1 capsule (40 mg total) by mouth daily before breakfast. 90 capsule 6  . gabapentin (NEURONTIN) 100 MG capsule Take 1 capsule (100 mg total) by mouth 3 (three) times daily. 90 capsule 0  . glucose blood test strip Use as instructedDispense based on patient and insurance preference.Check BS TID and PRN Dx:E119 100 each 12  . hydrALAZINE (APRESOLINE) 10 MG tablet Take 1 tablet (10 mg total) by mouth 2 (two) times daily. 60 tablet 3  . Lancets (ONETOUCH ULTRASOFT) lancets Use as instructedDispense based on patient and insurance preference.Check BS TID and PRN Dx:E119 100 each 12  . losartan (COZAAR) 50 MG tablet TAKE 1 TABLET BY MOUTH TWO  TIMES DAILY 180 tablet 1  . metFORMIN (GLUCOPHAGE-XR) 500 MG 24 hr tablet TAKE 1 TABLET BY MOUTH 3  TIMES DAILY WITH  MEALS 270 tablet 1  . metoCLOPramide (REGLAN) 5 MG tablet TAKE 1 TABLET BY MOUTH TWO  TIMES DAILY 180 tablet 0  . metoprolol tartrate (LOPRESSOR) 50 MG tablet TAKE 1 AND 1/2 TABLETS BY  MOUTH TWICE DAILY 270 tablet 1  . NEXIUM 40 MG capsule Take 1 capsule (40 mg total) by mouth daily. 90 capsule 3  . ondansetron (ZOFRAN) 4 MG tablet Take 4 mg by mouth every 4 (four) hours as needed. For nausea      . potassium chloride (K-DUR) 10 MEQ tablet TAKE 1 TABLET BY MOUTH  DAILY 90 tablet 2  . rosuvastatin (CRESTOR) 10 MG tablet TAKE 1 TABLET BY MOUTH  DAILY 90 tablet 2  . sodium chloride (OCEAN) 0.65 % SOLN nasal spray Place 1 spray into both nostrils as needed for congestion. 15 mL 0  . FOLBIC 2.5-25-2 MG TABS tablet TAKE 1 TABLET BY MOUTH EVERY DAY 90 tablet 1   No facility-administered medications prior to visit.     Allergies  Allergen Reactions  . Tramadol Other (See Comments)    Dizziness     Review of Systems  Constitutional: Positive for malaise/fatigue. Negative for fever.  HENT: Negative for congestion.   Eyes: Negative for blurred vision.  Respiratory: Negative for shortness of breath.   Cardiovascular: Negative for chest pain, palpitations and leg swelling.  Gastrointestinal: Negative for abdominal pain, blood in stool and nausea.  Genitourinary: Negative for dysuria and frequency.  Musculoskeletal: Positive for joint pain. Negative for falls.  Skin: Negative for rash.  Neurological: Negative for dizziness, loss of consciousness and headaches.  Endo/Heme/Allergies: Negative for environmental allergies.  Psychiatric/Behavioral: Positive for depression. The patient is nervous/anxious.        Objective:    Physical Exam unable to obtain via phone  LMP 02/07/1992  Wt Readings from Last 3 Encounters:  09/17/18 170  lb 6.4 oz (77.3 kg)  06/11/18 169 lb (76.7 kg)  04/12/18 172 lb (78 kg)    Diabetic Foot Exam - Simple   No data filed     Lab Results  Component Value  Date   WBC 7.4 09/17/2018   HGB 13.5 09/17/2018   HCT 39.9 09/17/2018   PLT 226.0 09/17/2018   GLUCOSE 156 (H) 09/17/2018   CHOL 279 (H) 06/26/2018   TRIG 144.0 06/26/2018   HDL 75.80 06/26/2018   LDLDIRECT 123.7 04/13/2011   LDLCALC 175 (H) 06/26/2018   ALT 24 09/17/2018   AST 19 09/17/2018   NA 133 (L) 09/17/2018   K 4.0 09/17/2018   CL 97 09/17/2018   CREATININE 0.69 09/17/2018   BUN 18 09/17/2018   CO2 25 09/17/2018   TSH 1.66 09/17/2018   INR 0.88 01/16/2011   HGBA1C 6.4 09/17/2018   MICROALBUR 1.6 11/13/2014    Lab Results  Component Value Date   TSH 1.66 09/17/2018   Lab Results  Component Value Date   WBC 7.4 09/17/2018   HGB 13.5 09/17/2018   HCT 39.9 09/17/2018   MCV 86.8 09/17/2018   PLT 226.0 09/17/2018   Lab Results  Component Value Date   NA 133 (L) 09/17/2018   K 4.0 09/17/2018   CHLORIDE 101 07/08/2015   CO2 25 09/17/2018   GLUCOSE 156 (H) 09/17/2018   BUN 18 09/17/2018   CREATININE 0.69 09/17/2018   BILITOT 0.7 09/17/2018   ALKPHOS 57 09/17/2018   AST 19 09/17/2018   ALT 24 09/17/2018   PROT 7.5 09/17/2018   ALBUMIN 4.6 09/17/2018   CALCIUM 10.2 09/17/2018   ANIONGAP 9 12/26/2017   EGFR 75 (L) 07/08/2015   GFR 82.40 09/17/2018   Lab Results  Component Value Date   CHOL 279 (H) 06/26/2018   Lab Results  Component Value Date   HDL 75.80 06/26/2018   Lab Results  Component Value Date   LDLCALC 175 (H) 06/26/2018   Lab Results  Component Value Date   TRIG 144.0 06/26/2018   Lab Results  Component Value Date   CHOLHDL 4 06/26/2018   Lab Results  Component Value Date   HGBA1C 6.4 09/17/2018       Assessment & Plan:   Problem List Items Addressed This Visit    Diabetes mellitus type 2 in obese ([place]) (Chronic)    hgba1c acceptable, minimize simple carbs. Increase exercise as tolerated. Continue current meds      Essential hypertension (Chronic)    Monitor vitals weeklyno changes to meds. Encouraged heart healthy diet  such as the DASH diet and exercise as tolerated.       Relevant Orders   Comprehensive metabolic panel   Anemia (Chronic)    Resolved. She was started on [place] many years ago. Her Vitamin B12 was noted to be high so we stopped it. Now her B12 is normal off the Folbic but she feels she is more fatigued off of it so she will try starting an over the counter Vitamin B complex.       HYPERCHOLESTEROLEMIA    Tolerating statin, encouraged heart healthy diet, avoid trans fats, minimize simple carbs and saturated fats. Increase exercise as tolerated      Pain in joint    Noting increase pain and stiffness most notably in right knee. No fall or injury. Encouraged moist heat and gentle stretching as tolerated. May try NSAIDs and prescription meds as directed and report if symptoms worsen or seek  immediate care      Vitamin D deficiency    Supplement and monitor      Anxiety    She is very worried about the pandemic and is maintaining quarantine well. Has been avoiding even contact with family. She is encouraged to consider an outdoor visit with masking before bad weather hits and if she gets worse we may need to think about medication management. She is endorsing lack of motivation and increased fatigue       Other Visit Diagnoses    Right leg pain    -  Primary   Relevant Orders   US ARTERIAL LOWER EXTREMITY DUPLEX RIGHT(NON-ABI)   Mixed hyperlipidemia       Relevant Orders   Lipid panel      I have changed Braylon N. Raptis's Folbic. I am also having her maintain her cholecalciferol, ondansetron, aspirin, esomeprazole, sodium chloride, metoCLOPramide, metoprolol tartrate, potassium chloride, metFORMIN, rosuvastatin, amLODipine, losartan, glucose blood, blood glucose meter kit and supplies, onetouch ultrasoft, hydrALAZINE, gabapentin, OneTouch UltraLink, dicyclomine, and NexIUM.  Meds ordered this encounter  Medications  . folic acid-pyridoxine-cyancobalamin (FOLBIC) 2.5-25-2 MG  TABS tablet    Sig: Take 1 tab po M,W,F    Dispense:  90 tablet    Refill:  1     I discussed the assessment and treatment plan with the patient. The patient was provided an opportunity to ask questions and all were answered. The patient agreed with the plan and demonstrated an understanding of the instructions.   The patient was advised to call back or seek an in-person evaluation if the symptoms worsen or if the condition fails to improve as anticipated.  I provided 25 minutes of non-face-to-face time during this encounter.   Penni Homans, MD

## 2018-10-13 NOTE — Assessment & Plan Note (Addendum)
She is very worried about the pandemic and is maintaining quarantine well. Has been avoiding even contact with family. She is encouraged to consider an outdoor visit with masking before bad weather hits and if she gets worse we may need to think about medication management. She is endorsing lack of motivation and increased fatigue

## 2018-10-13 NOTE — Assessment & Plan Note (Signed)
Monitor vitals weekly. no changes to meds. Encouraged heart healthy diet such as the DASH diet and exercise as tolerated.  

## 2018-10-16 ENCOUNTER — Other Ambulatory Visit: Payer: Self-pay | Admitting: Family Medicine

## 2018-10-16 DIAGNOSIS — M79604 Pain in right leg: Secondary | ICD-10-CM

## 2018-10-16 MED FILL — ONE TOUCH ULTRA TEST STRIPS: 90 days supply | Qty: 300 | Fill #2

## 2018-10-16 MED FILL — ONETOUCH DELICA PLUS LANCET: 50 days supply | Qty: 100 | Fill #1

## 2018-10-16 NOTE — Addendum Note (Signed)
Addended by: Magdalene Molly A on: 10/16/2018 11:32 AM   Modules accepted: Orders

## 2018-10-25 ENCOUNTER — Ambulatory Visit: Payer: Medicare Other

## 2018-10-29 ENCOUNTER — Telehealth: Payer: Self-pay | Admitting: *Deleted

## 2018-10-29 ENCOUNTER — Other Ambulatory Visit: Payer: Self-pay

## 2018-10-29 ENCOUNTER — Ambulatory Visit (HOSPITAL_BASED_OUTPATIENT_CLINIC_OR_DEPARTMENT_OTHER)
Admission: RE | Admit: 2018-10-29 | Discharge: 2018-10-29 | Disposition: A | Discharge status: Home / Self Care | Payer: Medicare Other | Source: Ambulatory Visit | Attending: Family Medicine | Admitting: Family Medicine

## 2018-10-29 DIAGNOSIS — R600 Localized edema: Secondary | ICD-10-CM | POA: Diagnosis not present

## 2018-10-29 DIAGNOSIS — M79604 Pain in right leg: Secondary | ICD-10-CM | POA: Insufficient documentation

## 2018-10-29 NOTE — Telephone Encounter (Signed)
Copied from [place] [phone]. Topic: General - Other >> Oct 28, 2018  3:51 PM Mathis Bud wrote: Reason for CRM: Patient is requesting to get lab orders when she comes in on the 30th. Call back [phone]

## 2018-10-29 NOTE — Telephone Encounter (Signed)
Labs ordered.

## 2018-11-04 ENCOUNTER — Other Ambulatory Visit: Payer: Self-pay | Admitting: Family

## 2018-11-06 ENCOUNTER — Other Ambulatory Visit: Payer: Self-pay

## 2018-11-06 ENCOUNTER — Ambulatory Visit (INDEPENDENT_AMBULATORY_CARE_PROVIDER_SITE_OTHER): Payer: Medicare Other

## 2018-11-06 DIAGNOSIS — Z23 Encounter for immunization: Secondary | ICD-10-CM | POA: Diagnosis not present

## 2018-11-12 ENCOUNTER — Other Ambulatory Visit: Payer: Self-pay | Admitting: Cardiology

## 2018-11-12 DIAGNOSIS — I6529 Occlusion and stenosis of unspecified carotid artery: Secondary | ICD-10-CM | POA: Insufficient documentation

## 2018-11-12 NOTE — Progress Notes (Signed)
Cardiology Office Note   Date:  11/13/2018   ID:  [name], DOB 26-May-1940, MRN [id number]  PCP:  [name], MD  Cardiologist:   Minus Breeding, MD   Chief Complaint  Patient presents with  . Chest Pain      History of Present Illness: [name] is a 78 y.o. female who  presents for followup of SVT. No further heart rhythm disturbances since I saw her.  Her blood pressure has been elevated in the mornings but comes down the afternoons.  She states she had some right leg pain recently and last month had lower extremity venous Doppler that I reviewed.  There was no evidence of DVT.  She has chronic mild lower extremity swelling.  She is describing chest discomfort.  This is sporadic.  It is substernal.  She is very sedentary and does not bring on with the minimal activity that she does.  She does not quantify or qualify it well.  It seems to be moderate.  He comes and goes spontaneously.  He does not describe neck or arm discomfort.  She does not describe associated nausea vomiting or diaphoresis.  She has no new shortness of breath, PND or orthopnea.   Past Medical History:  Diagnosis Date  . Abdominal pain in female 03/18/2010   Qualifier: Diagnosis of  By: Carlean Purl MD, Dimas Millin Anemia 06/08/2014  . Anxiety   . Arthritis    Spinal Osteoarthritis  . Cancer ([place])   . Carcinoid tumor of stomach   . Cataract   . Chest pain    Myoview 12/15 no ischemia.  . Chronic kidney disease    Left kidney smaller than right kidney  . Constipation 11/21/2016  . Diabetes mellitus type 2 in obese ([place]) 09/05/2006   Qualifier: Diagnosis of  By: Marca Ancona RMA, Lucy    . Diabetic peripheral neuropathy ([place]) 10/29/2013  . Encounter for preventative adult health care exam with abnormal findings 09/14/2013  . Esophageal reflux   . Gastric polyp    Fundic Gland  . Gastroparesis   . Headache(784.0)   . Heart murmur    Echocardiogram 2/11: EF 60-65%, mild LAE, grade 1  diastolic dysfunction, aortic valve sclerosis, mean gradient 9 mm of mercury, PASP 34  . Hematuria 03/16/2016  . Iron deficiency anemia, unspecified   . 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 Leg swelling    bilateral  . Neck pain 04/22/2015  . PONV (postoperative nausea and vomiting)    pt states only needs small amount of anesthesia  . PSVT (paroxysmal supraventricular tachycardia) ([place])   . Pure hypercholesterolemia   . 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 Stroke ([place])    tia, 2014  . 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 Type II or unspecified type diabetes mellitus without mention of complication, not stated as uncontrolled   . Unspecified essential hypertension   . Unspecified hereditary and idiopathic peripheral neuropathy 10/29/2013    Past Surgical History:  Procedure Laterality Date  . CHOLECYSTECTOMY  1993  . COLONOSCOPY  11/11/2010   diverticulosis  . DILATATION & CURRETTAGE/HYSTEROSCOPY WITH RESECTOCOPE N/A 02/25/2013   Procedure: Attempted hysteroscopy with uterine perforation;  Surgeon: Jamey Reas de Berton Lan, MD;  Location: [facility];  Service: Gynecology;  Laterality: N/A;  . ESOPHAGOGASTRODUODENOSCOPY  08/29/2010; 09/15/2010   Carcinoid tumor less than 1 cm in July 2012 not seen in August 2012 , gastritis, fundic gland polyps  . ESOPHAGOGASTRODUODENOSCOPY  05/16/2011  .  ESOPHAGOGASTRODUODENOSCOPY  06/14/2012  . EUS  12/15/2010   Procedure: UPPER ENDOSCOPIC ULTRASOUND (EUS) LINEAR;  Surgeon: Owens Loffler, MD;  Location: [facility];  Service: Endoscopy;  Laterality: N/A;  . EYE SURGERY Bilateral    Bi lateral cateracts and bi lateral laser  . LAPAROSCOPY N/A 02/25/2013   Procedure: Cystoscopy and laparoscopy with fulguration of uterine serosa;  Surgeon: Jamey Reas de Berton Lan, MD;  Location: [facility];  Service: Gynecology;  Laterality: N/A;  . TONSILLECTOMY       Current Outpatient Medications  Medication Sig Dispense Refill  . amLODipine (NORVASC) 5 MG tablet TAKE 1 TABLET BY  MOUTH  DAILY 90 tablet 3  . aspirin 81 MG tablet Take 81 mg by mouth daily.    . blood glucose meter kit and supplies Dispense based on patient and insurance preference.Check BS TID and PRN Dx:E119 1 each 0  . Blood Glucose Monitoring Suppl (ONETOUCH ULTRALINK) w/Device KIT Use to check blood sugars BID E11.69 1 kit 0  . cholecalciferol (VITAMIN D) 1000 UNITS tablet Take 1,000 Units by mouth daily.      Marland Kitchen dicyclomine (BENTYL) 10 MG capsule TAKE ONE CAPSULE BY MOUTH 4 TIMES DAILY AS NEEDED 704 capsule 1  . folic acid-pyridoxine-cyancobalamin (FOLBIC) 2.5-25-2 MG TABS tablet Take 1 tab po M,W,F 90 tablet 1  . glucose blood test strip Use as instructedDispense based on patient and insurance preference.Check BS TID and PRN Dx:E119 100 each 12  . hydrALAZINE (APRESOLINE) 10 MG tablet Take 1 tablet (10 mg total) by mouth 2 (two) times daily. (Patient taking differently: Take 10 mg by mouth 2 (two) times daily as needed. ) 60 tablet 3  . Lancets (ONETOUCH ULTRASOFT) lancets Use as instructedDispense based on patient and insurance preference.Check BS TID and PRN Dx:E119 100 each 12  . losartan (COZAAR) 50 MG tablet TAKE 1 TABLET BY MOUTH TWO  TIMES DAILY 180 tablet 1  . metFORMIN (GLUCOPHAGE-XR) 500 MG 24 hr tablet TAKE 1 TABLET BY MOUTH 3  TIMES DAILY WITH MEALS 270 tablet 1  . metoCLOPramide (REGLAN) 5 MG tablet TAKE 1 TABLET BY MOUTH TWO  TIMES DAILY 180 tablet 0  . metoprolol tartrate (LOPRESSOR) 50 MG tablet TAKE 1 AND 1/2 TABLETS BY  MOUTH TWICE DAILY 270 tablet 3  . NEXIUM 40 MG capsule Take 1 capsule (40 mg total) by mouth daily. 90 capsule 3  . ondansetron (ZOFRAN) 4 MG tablet Take 4 mg by mouth every 4 (four) hours as needed. For nausea      . potassium chloride (K-DUR) 10 MEQ tablet TAKE 1 TABLET BY MOUTH  DAILY 90 tablet 2  . rosuvastatin (CRESTOR) 10 MG tablet TAKE 1 TABLET BY MOUTH  DAILY 90 tablet 2  . sodium chloride (OCEAN) 0.65 % SOLN nasal spray Place 1 spray into both nostrils as  needed for congestion. 15 mL 0   No current facility-administered medications for this visit.     Allergies:   Tramadol    ROS:  Please see the history of present illness.   Otherwise, review of systems are positive for none.   All other systems are reviewed and negative.    PHYSICAL EXAM: VS:  BP (!) 142/73   Pulse 66   Temp (!) 97 F (36.1 C)   Ht '5\' 4"'$  (1.626 m)   Wt 170 lb (77.1 kg)   LMP 02/07/1992   SpO2 98%   BMI 29.18 kg/m  , BMI Body mass index is 29.18  kg/m. GENERAL:  Well appearing NECK:  No jugular venous distention, waveform within normal limits, carotid upstroke brisk and symmetric, no bruits, no thyromegaly LUNGS:  Clear to auscultation bilaterally CHEST:  Unremarkable HEART:  PMI not displaced or sustained,S1 and S2 within normal limits, no S3, no S4, no clicks, no rubs, 2 out of 6 brief apical systolic murmur, no diastolic murmurs ABD:  Flat, positive bowel sounds normal in frequency in pitch, no bruits, no rebound, no guarding, no midline pulsatile mass, no hepatomegaly, no splenomegaly EXT:  2 plus pulses throughout, mild bilateral leg edema, no cyanosis no clubbing   EKG:  EKG is ordered today. The ekg ordered today demonstrates sinus rhythm, rate 66, axis within normal limits, intervals within normal limits, no acute ST-T wave changes.   Recent Labs: 09/17/2018: ALT 24; BUN 18; Creatinine, Ser 0.69; Hemoglobin 13.5; Platelets 226.0; Potassium 4.0; Sodium 133; TSH 1.66    Lipid Panel    Component Value Date/Time   CHOL 279 (H) 06/26/2018 0906   TRIG 144.0 06/26/2018 0906   TRIG 94 11/16/2005 0905   HDL 75.80 06/26/2018 0906   CHOLHDL 4 06/26/2018 0906   VLDL 28.8 06/26/2018 0906   LDLCALC 175 (H) 06/26/2018 0906   LDLDIRECT 123.7 04/13/2011 0848      Wt Readings from Last 3 Encounters:  11/13/18 170 lb (77.1 kg)  09/17/18 170 lb 6.4 oz (77.3 kg)  06/11/18 169 lb (76.7 kg)      Other studies Reviewed: Additional studies/ records that  were reviewed today include: Labs. Review of the above records demonstrates:  Please see elsewhere in the note.     ASSESSMENT AND PLAN:   SVT:    She is had no recurrence of this.  No change in therapy.   Hypertension- Her blood pressure is high in the morning.  It comes down to the 130s over 70s in the afternoon.  She is sensitive to medications.  I will not titrate further.  Dyslipidemia - Her LDL was up to 175 but she was not taking her Crestor because of some GI complaints.  She started taking this again.  She is getting get checked again tomorrow.  If her LDL is not less than 100 I would suggest that she take 20 mg of Crestor.   Carotid stenosis - This is mild.  No change in therapy.  Aortic stenosis- This is mild and I will follow this clinically.  Aortic atherosclerosis - We will continue with risk reduction as above.   Chest pain - I think is a low pretest probability of obstructive coronary disease but I am going to have her do a stress test.  She would be able walk on a treadmill.  Therefore, she will have a [employer].  Current medicines are reviewed at length with the patient today.  The patient does not have concerns regarding medicines.  The following changes have been made:  no change  Labs/ tests ordered today include:   Orders Placed This Encounter  Procedures  . MYOCARDIAL PERFUSION IMAGING  . EKG 12-Lead     Disposition:   FU with me in six months.     Signed, Minus Breeding, MD  11/13/2018 2:40 PM    [facility]

## 2018-11-13 ENCOUNTER — Other Ambulatory Visit: Payer: Self-pay

## 2018-11-13 ENCOUNTER — Ambulatory Visit: Payer: Medicare Other | Admitting: Cardiology

## 2018-11-13 ENCOUNTER — Encounter: Payer: Self-pay | Admitting: Cardiology

## 2018-11-13 VITALS — BP 142/73 | HR 66 | Temp 97.0°F | Ht 64.0 in | Wt 170.0 lb

## 2018-11-13 DIAGNOSIS — E785 Hyperlipidemia, unspecified: Secondary | ICD-10-CM | POA: Diagnosis not present

## 2018-11-13 DIAGNOSIS — I700 Atherosclerosis of aorta: Secondary | ICD-10-CM

## 2018-11-13 DIAGNOSIS — I6529 Occlusion and stenosis of unspecified carotid artery: Secondary | ICD-10-CM

## 2018-11-13 DIAGNOSIS — I471 Supraventricular tachycardia: Secondary | ICD-10-CM

## 2018-11-13 DIAGNOSIS — I10 Essential (primary) hypertension: Secondary | ICD-10-CM | POA: Diagnosis not present

## 2018-11-13 DIAGNOSIS — I409 Acute myocarditis, unspecified: Secondary | ICD-10-CM

## 2018-11-13 DIAGNOSIS — I350 Nonrheumatic aortic (valve) stenosis: Secondary | ICD-10-CM | POA: Diagnosis not present

## 2018-11-13 DIAGNOSIS — R072 Precordial pain: Secondary | ICD-10-CM

## 2018-11-13 NOTE — Patient Instructions (Signed)
Medication Instructions:  Your physician recommends that you continue on your current medications as directed. Please refer to the Current Medication list given to you today.  If you need a refill on your cardiac medications before your next appointment, please call your pharmacy.   Lab work: NONE   Testing/Procedures: Your physician has requested that you have a lexiscan myoview. For further information please visit [email or web address]. Please follow instruction sheet, as given.    Follow-Up: At [facility], you and your health needs are our priority.  As part of our continuing mission to provide you with exceptional heart care, we have created designated Provider Care Teams.  These Care Teams include your primary Cardiologist (physician) and Advanced Practice Providers (APPs -  Physician Assistants and Nurse Practitioners) who all work together to provide you with the care you need, when you need it. You will need a follow up appointment in 6 months.  Please call our office 2 months in advance to schedule this appointment.  You may see Minus Breeding, MD or one of the following Advanced Practice Providers on your designated Care Team:   Rosaria Ferries, PA-C Jory Sims, DNP, ANP

## 2018-11-15 ENCOUNTER — Other Ambulatory Visit: Payer: Self-pay

## 2018-11-15 ENCOUNTER — Other Ambulatory Visit (INDEPENDENT_AMBULATORY_CARE_PROVIDER_SITE_OTHER): Payer: Medicare Other

## 2018-11-15 ENCOUNTER — Telehealth (HOSPITAL_COMMUNITY): Payer: Self-pay

## 2018-11-15 DIAGNOSIS — E782 Mixed hyperlipidemia: Secondary | ICD-10-CM | POA: Diagnosis not present

## 2018-11-15 DIAGNOSIS — I10 Essential (primary) hypertension: Secondary | ICD-10-CM

## 2018-11-15 LAB — LIPID PANEL
Cholesterol: 184 mg/dL (ref 0–200)
HDL: 62.9 mg/dL (ref >39.00)
LDL Cholesterol: 93 mg/dL (ref 0–99)
NonHDL: 121.43
Total CHOL/HDL Ratio: 3
Triglycerides: 141 mg/dL (ref 0.0–149.0)
VLDL: 28.2 mg/dL (ref 0.0–40.0)

## 2018-11-15 LAB — COMPREHENSIVE METABOLIC PANEL
ALT: 24 U/L (ref 0–35)
AST: 18 U/L (ref 0–37)
Albumin: 4.4 g/dL (ref 3.5–5.2)
Alkaline Phosphatase: 52 U/L (ref 39–117)
BUN: 18 mg/dL (ref 6–23)
CO2: 27 mEq/L (ref 19–32)
Calcium: 10 mg/dL (ref 8.4–10.5)
Chloride: 102 mEq/L (ref 96–112)
Creatinine, Ser: 0.66 mg/dL (ref 0.40–1.20)
GFR: 86.7 mL/min (ref >60.00)
Glucose, Bld: 114 mg/dL — ABNORMAL HIGH (ref 70–99)
Potassium: 3.8 mEq/L (ref 3.5–5.1)
Sodium: 139 mEq/L (ref 135–145)
Total Bilirubin: 0.8 mg/dL (ref 0.2–1.2)
Total Protein: 7 g/dL (ref 6.0–8.3)

## 2018-11-15 NOTE — Telephone Encounter (Signed)
Encounter complete. 

## 2018-11-20 ENCOUNTER — Ambulatory Visit (HOSPITAL_COMMUNITY): Payer: Medicare Other | Attending: Cardiology

## 2018-11-20 ENCOUNTER — Telehealth: Payer: Self-pay | Admitting: Internal Medicine

## 2018-11-20 NOTE — Telephone Encounter (Signed)
Patient reports that she has abdominal pai .  Despite abdominal pain.  Pain is mostly on the right. She will increase nexium to BID for now.  She has tried dicyclomine today with very minimal relief.  Patient has appt with APP on 11/27/18 first opening.  Please advise.  Also c/o dysphagia and lump in her throat.  Please advise

## 2018-11-21 MED ORDER — SUCRALFATE 1 G PO TABS: 1.0000 g | ORAL_TABLET | Freq: Three times a day (TID) | ORAL | 0 refills | Status: DC

## 2018-11-21 MED FILL — SUCRALFATE 1 GM TABLET: 1 | 30 days supply | Qty: 120 | Fill #0

## 2018-11-21 NOTE — Telephone Encounter (Signed)
I spoke to the patient.  For a week or so she has had a pinching pain in the right upper quadrant and some nausea.  She has had some vague globus or swallowing issues, she says the ear nose and throat specialist told her earlier this year that was "coming from her stomach".  These pains do not remind her of what we thought was diverticulitis earlier in the year.  Her stomach feels tight at times though that is a chronic intermittent complaint for her.  She is moving her bowels and does not have any fever.  Not vomiting.  When she lies down the pain is worse and it is on the right side and she cannot lie on her right side.  It is difficult for me to tell over the phone but she had been exercising earlier in the month but has not done in a week since she has not felt well.  I am suspicious of musculoskeletal problems.  I have advised her to take ibuprofen 400 mg 3 times daily and I have prescribed Carafate and I told her she did not need to do Nexium twice daily as that did not help overnight.  She has appointment with Tye Savoy, NP for October 21.  If there is focal abdominal wall tenderness she might be a good candidate for Dr. Bryan Lemma to do abdominal wall injection on.

## 2018-11-25 ENCOUNTER — Encounter (HOSPITAL_COMMUNITY): Payer: Self-pay | Admitting: Cardiology

## 2018-11-26 ENCOUNTER — Other Ambulatory Visit: Payer: Self-pay | Admitting: Gastroenterology

## 2018-11-27 ENCOUNTER — Ambulatory Visit: Payer: Medicare Other | Admitting: Nurse Practitioner

## 2018-11-27 ENCOUNTER — Other Ambulatory Visit: Payer: Self-pay

## 2018-11-27 ENCOUNTER — Encounter: Payer: Self-pay | Admitting: Nurse Practitioner

## 2018-11-27 VITALS — BP 122/82 | HR 86 | Temp 98.4°F | Ht 64.0 in | Wt 172.6 lb

## 2018-11-27 DIAGNOSIS — R1010 Upper abdominal pain, unspecified: Secondary | ICD-10-CM | POA: Diagnosis not present

## 2018-11-27 DIAGNOSIS — K5900 Constipation, unspecified: Secondary | ICD-10-CM

## 2018-11-27 NOTE — Patient Instructions (Signed)
If you are age 77 or older, your body mass index should be between 23-30. Your Body mass index is 29.63 kg/m. If this is out of the aforementioned range listed, please consider follow up with your Primary Care Provider.  If you are age 64 or younger, your body mass index should be between 19-25. Your Body mass index is 29.63 kg/m. If this is out of the aformentioned range listed, please consider follow up with your Primary Care Provider.   Take 1 capful of Miralax every morning.  Continue Carafate (complete current bottle)  Take an hour before breakfast, an hour before lunch, and take an hour after taking bedtime medications.  Follow up as needed.  Thank you for choosing me and [facility].   Tye Savoy, NP

## 2018-11-29 ENCOUNTER — Encounter: Payer: Self-pay | Admitting: Nurse Practitioner

## 2018-11-29 NOTE — Progress Notes (Signed)
Chief Complaint:  Follow up abdominal pain     IMPRESSION and PLAN:    1. 78 yo female with diffuse upper abdominal discomfort, better with Advil and Carafate that we started her on following a phone call on 11/20/18. Non-tender on exam.  -Reassurance provided -Having difficulty separating carafate from all of her other medications. Staying awake until midnight ( 2 hours after bedtime anti-hypertension meds) just to take carafate two hours later. Since she is doing much better will decrease carafate to TID. Advised her to take am carafate one hour before morning meds, another dose an hour before lunch. Omit dose before evening meal and take bedtime dose an hour after nighttime meds. Complete the remainder of the prescription (total of one month) then can stop.   2. Constipation, Carafate likely contributing.  -reducing dose of carafate and she will be done with prescription in a couple of weeks -trial of daily Miralax  HPI:     Patient is a 78 yo female known to [place]. She has a history of recurrent diverticulitis, GERD, remote hx of benign carcinoid tumor of stomach. She is s/p remote cholecystectomy. She called the office 10/14 with complaints of RUQ pain and vague globus. We recommended ibuprofen for suspected musculoskeletal pain and carafate for upper abdominal pain. She is here for follow up. Of note, CMET on 11/15/18 was normal. Fundic gland polyps on last EGD in Dec 2018.   She describes diffuse upper abdominal pain, belt - like but predominantly in RUQ without radiation to back. Also was having increased belching. Her symptoms have greatly improved with carafate. She was having problems swallowing carafate but learned about dissolving in water and tolerating it fine now. The only issue is that she is on several meds and it is difficult to separate Carafate from other medications. She is having issues with constipation.    Review of systems:     No chest pain, no SOB, no  fevers, no urinary sx   Past Medical History:  Diagnosis Date  . Abdominal pain in female 03/18/2010   Qualifier: Diagnosis of  By: Carlean Purl MD, Dimas Millin Anemia 06/08/2014  . Anxiety   . Arthritis    Spinal Osteoarthritis  . Cancer ([place])   . Carcinoid tumor of stomach   . Cataract   . Chest pain    Myoview 12/15 no ischemia.  . Chronic kidney disease    Left kidney smaller than right kidney  . Constipation 11/21/2016  . Diabetes mellitus type 2 in obese ([place]) 09/05/2006   Qualifier: Diagnosis of  By: Marca Ancona RMA, Lucy    . Diabetic peripheral neuropathy ([place]) 10/29/2013  . Encounter for preventative adult health care exam with abnormal findings 09/14/2013  . Esophageal reflux   . Gastric polyp    Fundic Gland  . Gastroparesis   . Headache(784.0)   . Heart murmur    Echocardiogram 2/11: EF 60-65%, mild LAE, grade 1 diastolic dysfunction, aortic valve sclerosis, mean gradient 9 mm of mercury, PASP 34  . Hematuria 03/16/2016  . Iron deficiency anemia, unspecified   . 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 Leg swelling    bilateral  . Neck pain 04/22/2015  . PONV (postoperative nausea and vomiting)    pt states only needs small amount of anesthesia  . PSVT (paroxysmal supraventricular tachycardia) ([place])   . Pure hypercholesterolemia   . 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 Stroke ([place])    tia, 2014  .  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 Type II or unspecified type diabetes mellitus without mention of complication, not stated as uncontrolled   . Unspecified essential hypertension   . Unspecified hereditary and idiopathic peripheral neuropathy 10/29/2013    Patient's surgical history, family medical history, social history, medications and allergies were all reviewed in Epic   Serum creatinine: 0.66 mg/dL 11/15/18 0924 Estimated creatinine clearance: 59.6 mL/min  Current Outpatient Medications  Medication Sig Dispense Refill  . amLODipine (NORVASC) 5 MG tablet TAKE 1 TABLET BY MOUTH  DAILY 90 tablet 3   . aspirin 81 MG tablet Take 81 mg by mouth daily.    . blood glucose meter kit and supplies Dispense based on patient and insurance preference.Check BS TID and PRN Dx:E119 1 each 0  . Blood Glucose Monitoring Suppl (ONETOUCH ULTRALINK) w/Device KIT Use to check blood sugars BID E11.69 1 kit 0  . cholecalciferol (VITAMIN D) 1000 UNITS tablet Take 1,000 Units by mouth daily.      Marland Kitchen dicyclomine (BENTYL) 10 MG capsule TAKE ONE CAPSULE BY MOUTH 4 TIMES DAILY AS NEEDED 129 capsule 1  . folic acid-pyridoxine-cyancobalamin (FOLBIC) 2.5-25-2 MG TABS tablet Take 1 tab po M,W,F 90 tablet 1  . glucose blood test strip Use as instructedDispense based on patient and insurance preference.Check BS TID and PRN Dx:E119 100 each 12  . hydrALAZINE (APRESOLINE) 10 MG tablet Take 1 tablet (10 mg total) by mouth 2 (two) times daily. (Patient taking differently: Take 10 mg by mouth 2 (two) times daily as needed. ) 60 tablet 3  . Lancets (ONETOUCH ULTRASOFT) lancets Use as instructedDispense based on patient and insurance preference.Check BS TID and PRN Dx:E119 100 each 12  . losartan (COZAAR) 50 MG tablet TAKE 1 TABLET BY MOUTH TWO  TIMES DAILY 180 tablet 1  . metFORMIN (GLUCOPHAGE-XR) 500 MG 24 hr tablet TAKE 1 TABLET BY MOUTH 3  TIMES DAILY WITH MEALS 270 tablet 1  . metoCLOPramide (REGLAN) 5 MG tablet TAKE 1 TABLET BY MOUTH  TWICE DAILY 180 tablet 0  . metoprolol tartrate (LOPRESSOR) 50 MG tablet TAKE 1 AND 1/2 TABLETS BY  MOUTH TWICE DAILY 270 tablet 3  . NEXIUM 40 MG capsule Take 1 capsule (40 mg total) by mouth daily. 90 capsule 3  . ondansetron (ZOFRAN) 4 MG tablet Take 4 mg by mouth every 4 (four) hours as needed. For nausea      . potassium chloride (K-DUR) 10 MEQ tablet TAKE 1 TABLET BY MOUTH  DAILY 90 tablet 2  . rosuvastatin (CRESTOR) 10 MG tablet TAKE 1 TABLET BY MOUTH  DAILY 90 tablet 2  . sodium chloride (OCEAN) 0.65 % SOLN nasal spray Place 1 spray into both nostrils as needed for congestion. 15 mL 0  .  sucralfate (CARAFATE) 1 g tablet Take 1 tablet (1 g total) by mouth 4 (four) times daily -  with meals and at bedtime. 120 tablet 0   No current facility-administered medications for this visit.     Physical Exam:     BP 122/82 (BP Location: Left Arm, Patient Position: Sitting)   Pulse 86   Temp 98.4 F (36.9 C)   Ht 5' 4"  (1.626 m)   Wt 172 lb 9.6 oz (78.3 kg)   LMP 02/07/1992   SpO2 98%   BMI 29.63 kg/m   GENERAL:  Pleasant female in NAD PSYCH: : Cooperative, normal affect EENT:  conjunctiva pink, mucous membranes moist, neck supple without masses CARDIAC:  RRR,  no peripheral edema  PULM: Normal respiratory effort, lungs CTA bilaterally, no wheezing ABDOMEN:  Nondistended, soft, nontender. No obvious masses, no hepatomegaly,  normal bowel sounds SKIN:  turgor, no lesions seen Musculoskeletal:  Normal muscle tone, normal strength NEURO: Alert and oriented x 3, no focal neurologic deficits   Tye Savoy , NP 11/29/2018, 12:59 PM

## 2018-12-09 ENCOUNTER — Telehealth: Payer: Self-pay | Admitting: Internal Medicine

## 2018-12-10 NOTE — Telephone Encounter (Signed)
Patient wants to know if she can stop the Carafate. She does not think it helps though she states she does not have the burning or abdominal pain. No belching. Careful with her diet. Discussed that if she quits taking Carafate and her GI symptoms return, she should resume Carafate. She is also scheduled for nuclear cardiac imaging. Concerned if the Carafate will be okay to take. Encouraged to ask Cardiology for guidance on her medications in regards to her imaging.

## 2018-12-12 ENCOUNTER — Telehealth: Payer: Self-pay | Admitting: Cardiology

## 2018-12-12 NOTE — Telephone Encounter (Signed)
Should not interfere

## 2018-12-12 NOTE — Telephone Encounter (Signed)
Spoke to pt. Was concerned that the Carafate she is taking for her GI issues is going to react with her lexiscan on the 11/11. Pt stated she stopped taking the medication. Advised pt to talk to GI about the medication but will forward to Dr Percival Spanish for review.

## 2018-12-12 NOTE — Telephone Encounter (Signed)
New Message     Pt is calling and has questions about her medications    Please call

## 2018-12-18 ENCOUNTER — Encounter (HOSPITAL_COMMUNITY): Payer: Medicare Other

## 2018-12-24 ENCOUNTER — Telehealth (HOSPITAL_COMMUNITY): Payer: Self-pay

## 2018-12-24 ENCOUNTER — Encounter (HOSPITAL_COMMUNITY): Payer: Medicare Other

## 2018-12-24 NOTE — Telephone Encounter (Signed)
Encounter complete. 

## 2018-12-26 ENCOUNTER — Telehealth: Payer: Self-pay | Admitting: Family Medicine

## 2018-12-26 NOTE — Telephone Encounter (Signed)
Patient declined AWV at this time. SF °

## 2018-12-27 ENCOUNTER — Other Ambulatory Visit: Payer: Self-pay

## 2018-12-27 ENCOUNTER — Ambulatory Visit (HOSPITAL_COMMUNITY)
Admission: RE | Admit: 2018-12-27 | Discharge: 2018-12-27 | Disposition: A | Discharge status: Home / Self Care | Payer: Medicare Other | Source: Ambulatory Visit | Attending: Cardiology | Admitting: Cardiology

## 2018-12-27 DIAGNOSIS — I409 Acute myocarditis, unspecified: Secondary | ICD-10-CM | POA: Diagnosis not present

## 2018-12-27 DIAGNOSIS — R072 Precordial pain: Secondary | ICD-10-CM | POA: Insufficient documentation

## 2018-12-27 LAB — MYOCARDIAL PERFUSION IMAGING
LV dias vol: 62 mL (ref 46–106)
LV sys vol: 16 mL
Peak HR: 99 {beats}/min
Rest HR: 61 {beats}/min
SDS: 1
SRS: 1
SSS: 2
TID: 1.06

## 2018-12-27 MED ORDER — TECHNETIUM TC 99M TETROFOSMIN IV KIT
10.7000 | PACK | Freq: Once | INTRAVENOUS | Status: AC | PRN
  Administered 2018-12-27: 10.7 via INTRAVENOUS
  Filled 2018-12-27: qty 11

## 2018-12-27 MED ORDER — TECHNETIUM TC 99M TETROFOSMIN IV KIT
27.8000 | PACK | Freq: Once | INTRAVENOUS | Status: AC | PRN
  Administered 2018-12-27: 27.8 via INTRAVENOUS
  Filled 2018-12-27: qty 28

## 2018-12-27 MED ORDER — REGADENOSON 0.4 MG/5ML IV SOLN
0.4000 mg | Freq: Once | INTRAVENOUS | Status: AC
  Administered 2018-12-27: 0.4 mg via INTRAVENOUS

## 2019-01-08 MED FILL — ONE TOUCH ULTRA TEST STRIPS: 90 days supply | Qty: 300 | Fill #3

## 2019-01-08 MED FILL — ONE TOUCH ULTRASOFT LANCETS: 75 days supply | Qty: 300 | Fill #0

## 2019-01-09 DIAGNOSIS — H00011 Hordeolum externum right upper eyelid: Secondary | ICD-10-CM | POA: Diagnosis not present

## 2019-01-09 MED FILL — POLYMYXIN B/TMP EYE DROPS: 10000-0.1 | 50 days supply | Qty: 10 | Fill #0

## 2019-01-14 ENCOUNTER — Other Ambulatory Visit: Payer: Self-pay | Admitting: Family Medicine

## 2019-01-16 ENCOUNTER — Other Ambulatory Visit: Payer: Self-pay

## 2019-01-16 ENCOUNTER — Ambulatory Visit (INDEPENDENT_AMBULATORY_CARE_PROVIDER_SITE_OTHER): Payer: Medicare Other | Admitting: Family Medicine

## 2019-01-16 DIAGNOSIS — I10 Essential (primary) hypertension: Secondary | ICD-10-CM

## 2019-01-16 DIAGNOSIS — E559 Vitamin D deficiency, unspecified: Secondary | ICD-10-CM | POA: Diagnosis not present

## 2019-01-16 DIAGNOSIS — K219 Gastro-esophageal reflux disease without esophagitis: Secondary | ICD-10-CM

## 2019-01-16 DIAGNOSIS — N952 Postmenopausal atrophic vaginitis: Secondary | ICD-10-CM

## 2019-01-16 DIAGNOSIS — E7800 Pure hypercholesterolemia, unspecified: Secondary | ICD-10-CM

## 2019-01-16 DIAGNOSIS — E669 Obesity, unspecified: Secondary | ICD-10-CM

## 2019-01-16 DIAGNOSIS — E1169 Type 2 diabetes mellitus with other specified complication: Secondary | ICD-10-CM

## 2019-01-16 MED ORDER — BETAMETHASONE VALERATE 0.1 % EX OINT: TOPICAL_OINTMENT | CUTANEOUS | 1 refills | Status: DC

## 2019-01-16 MED FILL — BETAMETHASONE VALER 0.1% OI: 0.1 | 30 days supply | Qty: 45 | Fill #0

## 2019-01-20 DIAGNOSIS — N952 Postmenopausal atrophic vaginitis: Secondary | ICD-10-CM | POA: Insufficient documentation

## 2019-01-20 NOTE — Assessment & Plan Note (Signed)
Supplement and monitor 

## 2019-01-20 NOTE — Progress Notes (Signed)
Virtual Visit via phone Note  I connected with [name] on 01/16/19 at 10:20 AM EST by a phone enabled telemedicine application and verified that I am speaking with the correct person using two identifiers.  Location: Patient: home Provider: office   I discussed the limitations of evaluation and management by telemedicine and the availability of in person appointments. The patient expressed understanding and agreed to proceed. CMA was able to get the patient set up on a phone visit after being unable to set up a video visit   Subjective:    [name], female    DOB: 01/21/1941, 78 y.o.   MRN: [id number]  No chief complaint on file.   HPI Patient is in today for follow up on chronic medical concerns including hypertension, hyperlipidemia, and anemia. No recent febrile illness or hospitalizations. Is having trouble with vaginal irritation and splitting of mucosa at times. No abdominal or back pain. Denies CP/palp/SOB/HA/congestion/fevers/GI or GU c/o. Taking meds as prescribed. She is maintaining quarantine and trying to maintain a heart healthy diet.   Past Medical History:  Diagnosis Date  . Abdominal pain in female 03/18/2010   Qualifier: Diagnosis of  By: Carlean Purl MD, Dimas Millin Anemia 06/08/2014  . Anxiety   . Arthritis    Spinal Osteoarthritis  . Cancer ([place])   . Carcinoid tumor of stomach   . Cataract   . Chest pain    Myoview 12/15 no ischemia.  . Chronic kidney disease    Left kidney smaller than right kidney  . Constipation 11/21/2016  . Diabetes mellitus type 2 in obese ([place]) 09/05/2006   Qualifier: Diagnosis of  By: Marca Ancona RMA, Lucy    . Diabetic peripheral neuropathy ([place]) 10/29/2013  . Encounter for preventative adult health care exam with abnormal findings 09/14/2013  . Esophageal reflux   . Gastric polyp    Fundic Gland  . Gastroparesis   . Headache(784.0)   . Heart murmur    Echocardiogram 2/11: EF 60-65%, mild LAE, grade 1  diastolic dysfunction, aortic valve sclerosis, mean gradient 9 mm of mercury, PASP 34  . Hematuria 03/16/2016  . Iron deficiency anemia, unspecified   . 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 Leg swelling    bilateral  . Neck pain 04/22/2015  . PONV (postoperative nausea and vomiting)    pt states only needs small amount of anesthesia  . PSVT (paroxysmal supraventricular tachycardia) ([place])   . Pure hypercholesterolemia   . 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 Stroke ([place])    tia, 2014  . 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 Type II or unspecified type diabetes mellitus without mention of complication, not stated as uncontrolled   . Unspecified essential hypertension   . Unspecified hereditary and idiopathic peripheral neuropathy 10/29/2013    Past Surgical History:  Procedure Laterality Date  . CHOLECYSTECTOMY  1993  . COLONOSCOPY  11/11/2010   diverticulosis  . DILATATION & CURRETTAGE/HYSTEROSCOPY WITH RESECTOCOPE N/A 02/25/2013   Procedure: Attempted hysteroscopy with uterine perforation;  Surgeon: Jamey Reas de Berton Lan, MD;  Location: [facility];  Service: Gynecology;  Laterality: N/A;  . ESOPHAGOGASTRODUODENOSCOPY  08/29/2010; 09/15/2010   Carcinoid tumor less than 1 cm in July 2012 not seen in August 2012 , gastritis, fundic gland polyps  . ESOPHAGOGASTRODUODENOSCOPY  05/16/2011  . ESOPHAGOGASTRODUODENOSCOPY  06/14/2012  . EUS  12/15/2010   Procedure: UPPER ENDOSCOPIC ULTRASOUND (EUS) LINEAR;  Surgeon: Owens Loffler, MD;  Location: [facility];  Service: Endoscopy;  Laterality: N/A;  . EYE SURGERY Bilateral    Bi lateral cateracts and bi lateral laser  . LAPAROSCOPY N/A 02/25/2013   Procedure: Cystoscopy and laparoscopy with fulguration of uterine serosa;  Surgeon: Jamey Reas de Berton Lan, MD;  Location: [facility];  Service: Gynecology;  Laterality: N/A;  . TONSILLECTOMY      Family History  Problem Relation Age of Onset  . Diabetes Mother   . Stroke Father        deceased age 61  . Heart  disease Sister        deceased MI age 8  . Heart disease Brother        deceased MI age 38  . Diabetes Maternal Grandmother   . Hypertension Paternal Grandmother   . Diabetes Sister   . Heart disease Sister   . Hypertension Sister   . Hyperlipidemia Sister   . Diabetes Sister   . Heart disease Sister   . Hypertension Sister   . Hyperlipidemia Sister   . Diabetes Brother   . Heart disease Brother   . Hypertension Brother   . Hyperlipidemia Brother   . Colon cancer Neg Hx   . Esophageal cancer Neg Hx   . Stomach cancer Neg Hx   . Rectal cancer Neg Hx     Social History   Socioeconomic History  . Marital status: Widowed    Spouse name: Not on file  . Number of children: 0  . Years of education: college  . Highest education level: Not on file  Occupational History  . Occupation: retired  Tobacco Use  . Smoking status: Never Smoker  . Smokeless tobacco: Never Used  . Tobacco comment: Never used tobacco  Substance and Sexual Activity  . Alcohol use: No    Alcohol/week: 0.0 standard drinks  . Drug use: No  . Sexual activity: Never    Partners: Male    Birth control/protection: Post-menopausal    Comment: lives alone, no dietary restrictions except avoid fresh veg, fruit, whole grains  Other Topics Concern  . Not on file  Social History Narrative   Patient was married ([name]) - widow   Patient does not have any children.   Patient is right-handed.   Patient has a BA degree.   One caffeine drink daily    Social Determinants of Health   Financial Resource Strain:   . Difficulty of Paying Living Expenses: Not on file  Food Insecurity:   . Worried About Charity fundraiser in the Last Year: Not on file  . Ran Out of Food in the Last Year: Not on file  Transportation Needs:   . Lack of Transportation (Medical): Not on file  . Lack of Transportation (Non-Medical): Not on file  Physical Activity:   . Days of Exercise per Week: Not on file  . Minutes of Exercise  per Session: Not on file  Stress:   . Feeling of Stress : Not on file  Social Connections:   . Frequency of Communication with Friends and Family: Not on file  . Frequency of Social Gatherings with Friends and Family: Not on file  . Attends Religious Services: Not on file  . Active Member of Clubs or Organizations: Not on file  . Attends Archivist Meetings: Not on file  . Marital Status: Not on file  Intimate Partner Violence:   . Fear of Current or Ex-Partner: Not on file  . Emotionally Abused: Not on file  . Physically  Abused: Not on file  . Sexually Abused: Not on file    Outpatient Medications Prior to Visit  Medication Sig Dispense Refill  . amLODipine (NORVASC) 5 MG tablet TAKE 1 TABLET BY MOUTH  DAILY 90 tablet 3  . aspirin 81 MG tablet Take 81 mg by mouth daily.    . blood glucose meter kit and supplies Dispense based on patient and insurance preference.Check BS TID and PRN Dx:E119 1 each 0  . Blood Glucose Monitoring Suppl (ONETOUCH ULTRALINK) w/Device KIT Use to check blood sugars BID E11.69 1 kit 0  . cholecalciferol (VITAMIN D) 1000 UNITS tablet Take 1,000 Units by mouth daily.      Marland Kitchen dicyclomine (BENTYL) 10 MG capsule TAKE ONE CAPSULE BY MOUTH 4 TIMES DAILY AS NEEDED 616 capsule 1  . folic acid-pyridoxine-cyancobalamin (FOLBIC) 2.5-25-2 MG TABS tablet Take 1 tab po M,W,F 90 tablet 1  . glucose blood test strip Use as instructedDispense based on patient and insurance preference.Check BS TID and PRN Dx:E119 100 each 12  . hydrALAZINE (APRESOLINE) 10 MG tablet Take 1 tablet (10 mg total) by mouth 2 (two) times daily. (Patient taking differently: Take 10 mg by mouth 2 (two) times daily as needed. ) 60 tablet 3  . Lancets (ONETOUCH ULTRASOFT) lancets Use as instructedDispense based on patient and insurance preference.Check BS TID and PRN Dx:E119 100 each 12  . losartan (COZAAR) 50 MG tablet TAKE 1 TABLET BY MOUTH  TWICE DAILY 180 tablet 3  . metFORMIN (GLUCOPHAGE-XR)  500 MG 24 hr tablet TAKE 1 TABLET BY MOUTH 3  TIMES DAILY WITH MEALS 270 tablet 3  . metoCLOPramide (REGLAN) 5 MG tablet TAKE 1 TABLET BY MOUTH  TWICE DAILY 180 tablet 0  . metoprolol tartrate (LOPRESSOR) 50 MG tablet TAKE 1 AND 1/2 TABLETS BY  MOUTH TWICE DAILY 270 tablet 3  . NEXIUM 40 MG capsule Take 1 capsule (40 mg total) by mouth daily. 90 capsule 3  . ondansetron (ZOFRAN) 4 MG tablet Take 4 mg by mouth every 4 (four) hours as needed. For nausea      . potassium chloride (K-DUR) 10 MEQ tablet TAKE 1 TABLET BY MOUTH  DAILY 90 tablet 2  . rosuvastatin (CRESTOR) 10 MG tablet TAKE 1 TABLET BY MOUTH  DAILY 90 tablet 2  . sodium chloride (OCEAN) 0.65 % SOLN nasal spray Place 1 spray into both nostrils as needed for congestion. 15 mL 0  . sucralfate (CARAFATE) 1 g tablet Take 1 tablet (1 g total) by mouth 4 (four) times daily -  with meals and at bedtime. 120 tablet 0   No facility-administered medications prior to visit.    Allergies  Allergen Reactions  . Tramadol Other (See Comments)    Dizziness     Review of Systems  Constitutional: Negative for fever and malaise/fatigue.  HENT: Negative for congestion.   Eyes: Negative for blurred vision.  Respiratory: Negative for shortness of breath.   Cardiovascular: Negative for chest pain, palpitations and leg swelling.  Gastrointestinal: Negative for abdominal pain, blood in stool and nausea.  Genitourinary: Negative for dysuria and frequency.  Musculoskeletal: Negative for falls.  Skin: Negative for rash.  Neurological: Negative for dizziness, loss of consciousness and headaches.  Endo/Heme/Allergies: Negative for environmental allergies.  Psychiatric/Behavioral: Negative for depression. The patient is not nervous/anxious.        Objective:    Physical Exam unable to obtain via phone  BP (!) 150/75 (BP Location: Left Arm, Patient Position: Sitting, Cuff Size:  Normal)   Wt 167 lb (75.8 kg)   LMP 02/07/1992   BMI 28.67 kg/m  Wt  Readings from Last 3 Encounters:  01/16/19 167 lb (75.8 kg)  12/27/18 170 lb (77.1 kg)  11/27/18 172 lb 9.6 oz (78.3 kg)    Diabetic Foot Exam - Simple   No data filed     Lab Results  Component Value Date   WBC 7.4 09/17/2018   HGB 13.5 09/17/2018   HCT 39.9 09/17/2018   PLT 226.0 09/17/2018   GLUCOSE 114 (H) 11/15/2018   CHOL 184 11/15/2018   TRIG 141.0 11/15/2018   HDL 62.90 11/15/2018   LDLDIRECT 123.7 04/13/2011   LDLCALC 93 11/15/2018   ALT 24 11/15/2018   AST 18 11/15/2018   NA 139 11/15/2018   K 3.8 11/15/2018   CL 102 11/15/2018   CREATININE 0.66 11/15/2018   BUN 18 11/15/2018   CO2 27 11/15/2018   TSH 1.66 09/17/2018   INR 0.88 01/16/2011   HGBA1C 6.4 09/17/2018   MICROALBUR 1.6 11/13/2014    Lab Results  Component Value Date   TSH 1.66 09/17/2018   Lab Results  Component Value Date   WBC 7.4 09/17/2018   HGB 13.5 09/17/2018   HCT 39.9 09/17/2018   MCV 86.8 09/17/2018   PLT 226.0 09/17/2018   Lab Results  Component Value Date   NA 139 11/15/2018   K 3.8 11/15/2018   CHLORIDE 101 07/08/2015   CO2 27 11/15/2018   GLUCOSE 114 (H) 11/15/2018   BUN 18 11/15/2018   CREATININE 0.66 11/15/2018   BILITOT 0.8 11/15/2018   ALKPHOS 52 11/15/2018   AST 18 11/15/2018   ALT 24 11/15/2018   PROT 7.0 11/15/2018   ALBUMIN 4.4 11/15/2018   CALCIUM 10.0 11/15/2018   ANIONGAP 9 12/26/2017   EGFR 75 (L) 07/08/2015   GFR 86.70 11/15/2018   Lab Results  Component Value Date   CHOL 184 11/15/2018   Lab Results  Component Value Date   HDL 62.90 11/15/2018   Lab Results  Component Value Date   LDLCALC 93 11/15/2018   Lab Results  Component Value Date   TRIG 141.0 11/15/2018   Lab Results  Component Value Date   CHOLHDL 3 11/15/2018   Lab Results  Component Value Date   HGBA1C 6.4 09/17/2018       Assessment & Plan:   Problem List Items Addressed This Visit    Diabetes mellitus type 2 in obese ([place]) (Chronic)    hgba1c acceptable,  minimize simple carbs. Increase exercise as tolerated. Continue current meds      Essential hypertension (Chronic)    Patient will monitor and report any concernsno changes to meds. Encouraged heart healthy diet such as the DASH diet and exercise as tolerated.       HYPERCHOLESTEROLEMIA    Tolerating statin, encouraged heart healthy diet, avoid trans fats, minimize simple carbs and saturated fats. Increase exercise as tolerated      Vitamin D deficiency    Supplement and monitor      GERD (gastroesophageal reflux disease)    Avoid offending foods, start probiotics. Do not eat large meals in late evening and consider raising head of bed.       Atrophic vaginitis    Apply a small amount of Betamethasone qod prn         I am having [name] start on betamethasone valerate ointment. I am also having her maintain her cholecalciferol, ondansetron, aspirin, sodium  chloride, potassium chloride, rosuvastatin, amLODipine, glucose blood, blood glucose meter kit and supplies, onetouch ultrasoft, hydrALAZINE, OneTouch UltraLink, dicyclomine, NexIUM, Folbic, metoprolol tartrate, sucralfate, metoCLOPramide, losartan, and metFORMIN.  Meds ordered this encounter  Medications  . betamethasone valerate ointment (VALISONE) 0.1 %    Sig: Apply qod to affected area, small amount    Dispense:  45 g    Refill:  1   I discussed the assessment and treatment plan with the patient. The patient was provided an opportunity to ask questions and all were answered. The patient agreed with the plan and demonstrated an understanding of the instructions.   The patient was advised to call back or seek an in-person evaluation if the symptoms worsen or if the condition fails to improve as anticipated.  I provided 25 minutes of non-face-to-face time during this encounter.  Penni Homans, MD

## 2019-01-20 NOTE — Assessment & Plan Note (Signed)
Avoid offending foods, start probiotics. Do not eat large meals in late evening and consider raising head of bed.  

## 2019-01-20 NOTE — Assessment & Plan Note (Signed)
Apply a small amount of Betamethasone qod prn

## 2019-01-20 NOTE — Assessment & Plan Note (Signed)
hgba1c acceptable, minimize simple carbs. Increase exercise as tolerated. Continue current meds 

## 2019-01-20 NOTE — Assessment & Plan Note (Signed)
Patient will monitor and report any concernsno changes to meds. Encouraged heart healthy diet such as the DASH diet and exercise as tolerated.

## 2019-01-20 NOTE — Assessment & Plan Note (Signed)
Tolerating statin, encouraged heart healthy diet, avoid trans fats, minimize simple carbs and saturated fats. Increase exercise as tolerated 

## 2019-02-11 ENCOUNTER — Telehealth: Payer: Self-pay

## 2019-02-11 DIAGNOSIS — E782 Mixed hyperlipidemia: Secondary | ICD-10-CM

## 2019-02-11 DIAGNOSIS — E1169 Type 2 diabetes mellitus with other specified complication: Secondary | ICD-10-CM

## 2019-02-11 DIAGNOSIS — E7800 Pure hypercholesterolemia, unspecified: Secondary | ICD-10-CM

## 2019-02-11 DIAGNOSIS — E559 Vitamin D deficiency, unspecified: Secondary | ICD-10-CM

## 2019-02-11 DIAGNOSIS — I10 Essential (primary) hypertension: Secondary | ICD-10-CM

## 2019-02-11 NOTE — Telephone Encounter (Signed)
Lab orders placed please schedule lab appt  

## 2019-02-11 NOTE — Addendum Note (Signed)
Addended by: Magdalene Molly A on: 02/11/2019 04:34 PM   Modules accepted: Orders

## 2019-02-11 NOTE — Telephone Encounter (Signed)
Copied from [place] [id number]. Topic: Appointment Scheduling - Scheduling Inquiry for Clinic >> Feb 10, 2019 11:30 AM Nils Flack, Marland Kitchen wrote: Pt says that provider was supposed to put in lab orders but none have been entered yet. Please enter and call pt to schedule  Cb is [phone]

## 2019-02-18 ENCOUNTER — Other Ambulatory Visit: Payer: Self-pay

## 2019-02-18 ENCOUNTER — Other Ambulatory Visit (INDEPENDENT_AMBULATORY_CARE_PROVIDER_SITE_OTHER): Payer: Medicare Other

## 2019-02-18 DIAGNOSIS — I10 Essential (primary) hypertension: Secondary | ICD-10-CM

## 2019-02-18 DIAGNOSIS — E1169 Type 2 diabetes mellitus with other specified complication: Secondary | ICD-10-CM | POA: Diagnosis not present

## 2019-02-18 DIAGNOSIS — E559 Vitamin D deficiency, unspecified: Secondary | ICD-10-CM | POA: Diagnosis not present

## 2019-02-18 DIAGNOSIS — E782 Mixed hyperlipidemia: Secondary | ICD-10-CM

## 2019-02-18 DIAGNOSIS — E7800 Pure hypercholesterolemia, unspecified: Secondary | ICD-10-CM

## 2019-02-18 DIAGNOSIS — E669 Obesity, unspecified: Secondary | ICD-10-CM

## 2019-02-18 LAB — COMPREHENSIVE METABOLIC PANEL
ALT: 21 U/L (ref 0–35)
AST: 17 U/L (ref 0–37)
Albumin: 4.4 g/dL (ref 3.5–5.2)
Alkaline Phosphatase: 57 U/L (ref 39–117)
BUN: 16 mg/dL (ref 6–23)
CO2: 29 mEq/L (ref 19–32)
Calcium: 10.2 mg/dL (ref 8.4–10.5)
Chloride: 99 mEq/L (ref 96–112)
Creatinine, Ser: 0.71 mg/dL (ref 0.40–1.20)
GFR: 79.64 mL/min (ref >60.00)
Glucose, Bld: 128 mg/dL — ABNORMAL HIGH (ref 70–99)
Potassium: 4.2 mEq/L (ref 3.5–5.1)
Sodium: 137 mEq/L (ref 135–145)
Total Bilirubin: 0.7 mg/dL (ref 0.2–1.2)
Total Protein: 7 g/dL (ref 6.0–8.3)

## 2019-02-18 LAB — CBC
HCT: 38.3 % (ref 36.0–46.0)
Hemoglobin: 12.7 g/dL (ref 12.0–15.0)
MCHC: 33.1 g/dL (ref 30.0–36.0)
MCV: 87 fl (ref 78.0–100.0)
Platelets: 216 10*3/uL (ref 150.0–400.0)
RBC: 4.4 Mil/uL (ref 3.87–5.11)
RDW: 12.5 % (ref 11.5–15.5)
WBC: 7.2 10*3/uL (ref 4.0–10.5)

## 2019-02-18 LAB — VITAMIN B12: Vitamin B-12: 344 pg/mL (ref 211–911)

## 2019-02-18 LAB — HEMOGLOBIN A1C: Hgb A1c MFr Bld: 6.4 % (ref 4.6–6.5)

## 2019-02-18 LAB — TSH: TSH: 3.08 u[IU]/mL (ref 0.35–4.50)

## 2019-02-18 LAB — LIPID PANEL
Cholesterol: 185 mg/dL (ref 0–200)
HDL: 61.1 mg/dL (ref >39.00)
LDL Cholesterol: 93 mg/dL (ref 0–99)
NonHDL: 124.27
Total CHOL/HDL Ratio: 3
Triglycerides: 158 mg/dL — ABNORMAL HIGH (ref 0.0–149.0)
VLDL: 31.6 mg/dL (ref 0.0–40.0)

## 2019-02-18 LAB — VITAMIN D 25 HYDROXY (VIT D DEFICIENCY, FRACTURES): VITD: 42.47 ng/mL (ref 30.00–100.00)

## 2019-02-24 ENCOUNTER — Encounter: Payer: Self-pay | Admitting: Internal Medicine

## 2019-02-25 ENCOUNTER — Telehealth: Payer: Self-pay | Admitting: Internal Medicine

## 2019-02-25 NOTE — Telephone Encounter (Signed)
Called patient and left message on Vmail to call office and schedule OV to discuss a endoscopy per Dr. Celesta Aver recall assessment.

## 2019-02-28 DIAGNOSIS — E119 Type 2 diabetes mellitus without complications: Secondary | ICD-10-CM | POA: Diagnosis not present

## 2019-02-28 DIAGNOSIS — H35033 Hypertensive retinopathy, bilateral: Secondary | ICD-10-CM | POA: Diagnosis not present

## 2019-02-28 DIAGNOSIS — Z961 Presence of intraocular lens: Secondary | ICD-10-CM | POA: Diagnosis not present

## 2019-02-28 DIAGNOSIS — H10503 Unspecified blepharoconjunctivitis, bilateral: Secondary | ICD-10-CM | POA: Diagnosis not present

## 2019-02-28 DIAGNOSIS — H04123 Dry eye syndrome of bilateral lacrimal glands: Secondary | ICD-10-CM | POA: Diagnosis not present

## 2019-02-28 LAB — HM DIABETES EYE EXAM

## 2019-03-06 ENCOUNTER — Ambulatory Visit: Payer: Self-pay

## 2019-03-10 ENCOUNTER — Encounter: Payer: Self-pay | Admitting: Family Medicine

## 2019-03-11 ENCOUNTER — Other Ambulatory Visit: Payer: Self-pay

## 2019-03-11 ENCOUNTER — Encounter: Payer: Self-pay | Admitting: Family Medicine

## 2019-03-11 ENCOUNTER — Ambulatory Visit: Payer: Medicare Other | Admitting: Family Medicine

## 2019-03-14 ENCOUNTER — Ambulatory Visit: Payer: Medicare Other | Attending: Internal Medicine

## 2019-03-14 DIAGNOSIS — Z23 Encounter for immunization: Secondary | ICD-10-CM | POA: Insufficient documentation

## 2019-03-14 NOTE — Progress Notes (Signed)
   Covid-19 Vaccination Clinic  Name:  [name]    MRN: [id number] DOB: 1941-01-21  03/14/2019  [name] was observed post Covid-19 immunization for 15 minutes without incidence. She was provided with Vaccine Information Sheet and instruction to access the V-Safe system.   [name] was instructed to call 911 with any severe reactions post vaccine: Marland Kitchen Difficulty breathing  . Swelling of your face and throat  . A fast heartbeat  . A bad rash all over your body  . Dizziness and weakness    Immunizations Administered    Name Date Dose VIS Date Route   Pfizer COVID-19 Vaccine 03/14/2019  3:40 PM 0.3 mL 01/17/2019 Intramuscular   Manufacturer: [place]   Lot: [id number]   [place]: [id number]

## 2019-03-31 ENCOUNTER — Ambulatory Visit: Payer: Medicare Other | Admitting: Internal Medicine

## 2019-03-31 ENCOUNTER — Encounter: Payer: Self-pay | Admitting: Internal Medicine

## 2019-03-31 ENCOUNTER — Other Ambulatory Visit: Payer: Self-pay

## 2019-03-31 VITALS — BP 120/60 | HR 63 | Temp 98.4°F | Ht 61.0 in | Wt 168.0 lb

## 2019-03-31 DIAGNOSIS — K30 Functional dyspepsia: Secondary | ICD-10-CM

## 2019-03-31 DIAGNOSIS — K581 Irritable bowel syndrome with constipation: Secondary | ICD-10-CM | POA: Diagnosis not present

## 2019-03-31 DIAGNOSIS — K3184 Gastroparesis: Secondary | ICD-10-CM

## 2019-03-31 MED ORDER — DICYCLOMINE HCL 10 MG PO CAPS: ORAL_CAPSULE | ORAL | 1 refills | Status: DC

## 2019-03-31 NOTE — Progress Notes (Signed)
[name] 79 y.o. [birth year] [id number]  Assessment & Plan:   Encounter Diagnoses  Name Primary?  . Irritable bowel syndrome with constipation Yes  . Functional dyspepsia   . Gastroparesis     Instead of MiraLAX which caused more frequent defecation try 1 tablespoon of Benefiber at bedtime or in the evening  Hopefully by promoting better defecation she will feel better as today the main thing I heard was that when she defecates she has relief of these abdominal symptoms.  There is a background of significant functional GI disturbance.  She also has this history of carcinoid though it was an isolated 1 and we have never seen any others though she has many many fundic gland polyps.  Last EGD in 2018.  She used to push to get checked given the carcinoid but she is not aggressively asking for follow-up and I do not know that we need to do that there is no clear protocol to do that with what she has had.  She wants to put that on hold right now.  If her pains do worsen then we need to consider cross-sectional imaging with CT scanning.  She is told to call me back and asked to get a message to me and/or my nurse.  She felt like she had some difficulty communicating her needs the last time.  She will return in approximately 1 month for reassessment CC: Valerie Lukes, MD  Subjective:   Chief Complaint: Abdominal pain, history of carcinoid polyp of the stomach  HPI 79 year old woman with a history of gastroparesis fundic gland polyps, incidental carcinoid tumor of the stomach and chronic abdominal pain irritable bowel dyspeptic symptoms.  Also with a history of suspected diverticulitis.  She has been having a bandlike upper abdominal pain which is new which comes and goes, but she had called in and I recommended some Carafate for that which seemed to help a little bit but Carafate is difficult to take due to the scheduling issues and the size of the tablets.  She is wondering  if she should take more of it.  She is also complaining of a left lower quadrant pain and left groin pain.  These all get better when she defecates.  She is feeling better at this time.  She was using some MiraLAX but felt like she went too much and then felt sore.  So she has stopped that.  She does use Benefiber occasionally and she is drinking fennel seeds and also taking cumin.  dicyclomine helps her pain when taken.  Sometimes she will have urgent defecation but she does not have urinary leakage or problems or urinary urgency.   Wt Readings from Last 3 Encounters:  03/31/19 168 lb (76.2 kg)  01/16/19 167 lb (75.8 kg)  12/27/18 170 lb (77.1 kg)    Allergies  Allergen Reactions  . Tramadol Other (See Comments)    Dizziness    Current Meds  Medication Sig  . amLODipine (NORVASC) 5 MG tablet TAKE 1 TABLET BY MOUTH  DAILY  . aspirin 81 MG tablet Take 81 mg by mouth daily.  . betamethasone valerate ointment (VALISONE) 0.1 % Apply qod to affected area, small amount  . blood glucose meter kit and supplies Dispense based on patient and insurance preference.Check BS TID and PRN Dx:E119  . Blood Glucose Monitoring Suppl (ONETOUCH ULTRALINK) w/Device KIT Use to check blood sugars BID E11.69  . cholecalciferol (VITAMIN D) 1000 UNITS tablet Take 1,000 Units by mouth  daily.    . dicyclomine (BENTYL) 10 MG capsule TAKE ONE CAPSULE BY MOUTH 4 TIMES DAILY AS NEEDED  . folic acid-pyridoxine-cyancobalamin (FOLBIC) 2.5-25-2 MG TABS tablet Take 1 tab po M,W,F  . glucose blood test strip Use as instructedDispense based on patient and insurance preference.Check BS TID and PRN Dx:E119  . hydrALAZINE (APRESOLINE) 10 MG tablet Take 1 tablet (10 mg total) by mouth 2 (two) times daily. (Patient taking differently: Take 10 mg by mouth 2 (two) times daily as needed. )  . Lancets (ONETOUCH ULTRASOFT) lancets Use as instructedDispense based on patient and insurance preference.Check BS TID and PRN Dx:E119  .  losartan (COZAAR) 50 MG tablet TAKE 1 TABLET BY MOUTH  TWICE DAILY  . metFORMIN (GLUCOPHAGE-XR) 500 MG 24 hr tablet TAKE 1 TABLET BY MOUTH 3  TIMES DAILY WITH MEALS  . metoCLOPramide (REGLAN) 5 MG tablet TAKE 1 TABLET BY MOUTH  TWICE DAILY  . metoprolol tartrate (LOPRESSOR) 50 MG tablet TAKE 1 AND 1/2 TABLETS BY  MOUTH TWICE DAILY  . NEXIUM 40 MG capsule Take 1 capsule (40 mg total) by mouth daily.  . ondansetron (ZOFRAN) 4 MG tablet Take 4 mg by mouth every 4 (four) hours as needed. For nausea    . potassium chloride (K-DUR) 10 MEQ tablet TAKE 1 TABLET BY MOUTH  DAILY  . rosuvastatin (CRESTOR) 10 MG tablet TAKE 1 TABLET BY MOUTH  DAILY  . sodium chloride (OCEAN) 0.65 % SOLN nasal spray Place 1 spray into both nostrils as needed for congestion.   Past Medical History:  Diagnosis Date  . Abdominal pain in female 03/18/2010   Qualifier: Diagnosis of  By: Carlean Purl MD, Dimas Millin Anemia 06/08/2014  . Anxiety   . Arthritis    Spinal Osteoarthritis  . Cancer ([place])   . Carcinoid tumor of stomach   . Cataract   . Chest pain    Myoview 12/15 no ischemia.  . Chronic kidney disease    Left kidney smaller than right kidney  . Constipation 11/21/2016  . Diabetes mellitus type 2 in obese ([place]) 09/05/2006   Qualifier: Diagnosis of  By: Marca Ancona RMA, Lucy    . Diabetic peripheral neuropathy ([place]) 10/29/2013  . Encounter for preventative adult health care exam with abnormal findings 09/14/2013  . Esophageal reflux   . Gastric polyp    Fundic Gland  . Gastroparesis   . Headache(784.0)   . Heart murmur    Echocardiogram 2/11: EF 60-65%, mild LAE, grade 1 diastolic dysfunction, aortic valve sclerosis, mean gradient 9 mm of mercury, PASP 34  . Hematuria 03/16/2016  . Iron deficiency anemia, unspecified   . 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 Leg swelling    bilateral  . Neck pain 04/22/2015  . PONV (postoperative nausea and vomiting)    pt states only needs small amount of anesthesia  . PSVT (paroxysmal  supraventricular tachycardia) ([place])   . Pure hypercholesterolemia   . 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 Stroke ([place])    tia, 2014  . 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 Type II or unspecified type diabetes mellitus without mention of complication, not stated as uncontrolled   . Unspecified essential hypertension   . Unspecified hereditary and idiopathic peripheral neuropathy 10/29/2013   Past Surgical History:  Procedure Laterality Date  . CHOLECYSTECTOMY  1993  . COLONOSCOPY  11/11/2010   diverticulosis  . DILATATION & CURRETTAGE/HYSTEROSCOPY WITH RESECTOCOPE N/A 02/25/2013   Procedure: Attempted hysteroscopy with uterine perforation;  Surgeon: Everardo All  Amundson de Berton Lan, MD;  Location: [facility];  Service: Gynecology;  Laterality: N/A;  . ESOPHAGOGASTRODUODENOSCOPY  08/29/2010; 09/15/2010   Carcinoid tumor less than 1 cm in July 2012 not seen in August 2012 , gastritis, fundic gland polyps  . ESOPHAGOGASTRODUODENOSCOPY  05/16/2011  . ESOPHAGOGASTRODUODENOSCOPY  06/14/2012  . EUS  12/15/2010   Procedure: UPPER ENDOSCOPIC ULTRASOUND (EUS) LINEAR;  Surgeon: Owens Loffler, MD;  Location: [facility];  Service: Endoscopy;  Laterality: N/A;  . EYE SURGERY Bilateral    Bi lateral cateracts and bi lateral laser  . LAPAROSCOPY N/A 02/25/2013   Procedure: Cystoscopy and laparoscopy with fulguration of uterine serosa;  Surgeon: Jamey Reas de Berton Lan, MD;  Location: [facility];  Service: Gynecology;  Laterality: N/A;  . 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   Patient is right-handed.   Patient has a BA degree.   One caffeine drink daily    family history includes Diabetes in her brother, maternal grandmother, mother, sister, and sister; Heart disease in her brother, brother, sister, sister, and sister; Hyperlipidemia in her brother, sister, and sister; Hypertension in her brother, paternal grandmother,  sister, and sister; Stroke in her father.   Review of Systems  As above Objective:   Physical Exam BP 120/60   Pulse 63   Temp 98.4 F (36.9 C)   Ht 5' 1"  (1.549 m)   Wt 168 lb (76.2 kg)   LMP 02/07/1992   BMI 31.74 kg/m  Elderly [place] woman no acute distress Abdomen is soft nontender benign

## 2019-03-31 NOTE — Patient Instructions (Addendum)
Call us if you have problems again and get the message to Dr Celesta Aver RN Francetta Found. You may need a Cat Scan.     We have sent the following prescriptions to your mail in pharmacy: Generic Bentyl  If you have not heard from your mail in pharmacy within 1 week or if you have not received your medication in the mail, please contact us at [phone] so we may find out why.    Take one tablespoon of Benefiber nightly.   Follow up with Dr Carlean Purl in a month.    I appreciate the opportunity to care for you. Silvano Rusk, MD, [facility]

## 2019-04-08 MED FILL — BETAMETHASONE VALER 0.1% OI: 0.1 | 30 days supply | Qty: 45 | Fill #0

## 2019-04-08 MED FILL — ONE TOUCH ULTRA TEST STRIPS: 90 days supply | Qty: 300 | Fill #4

## 2019-04-08 MED FILL — ONE TOUCH ULTRASOFT LANCETS: 75 days supply | Qty: 300 | Fill #1

## 2019-04-09 ENCOUNTER — Ambulatory Visit: Payer: Medicare Other | Attending: Internal Medicine

## 2019-04-09 DIAGNOSIS — Z23 Encounter for immunization: Secondary | ICD-10-CM

## 2019-04-09 NOTE — Progress Notes (Signed)
   Covid-19 Vaccination Clinic  Name:  [name]    MRN: [id number] DOB: 1940-10-17  04/09/2019  [name] was observed post Covid-19 immunization for 15 minutes without incident. She was provided with Vaccine Information Sheet and instruction to access the V-Safe system.   [name] was instructed to call 911 with any severe reactions post vaccine: Marland Kitchen Difficulty breathing  . Swelling of face and throat  . A fast heartbeat  . A bad rash all over body  . Dizziness and weakness   Immunizations Administered    Name Date Dose VIS Date Route   Pfizer COVID-19 Vaccine 04/09/2019 10:07 AM 0.3 mL 01/17/2019 Intramuscular   Manufacturer: [place]   Lot: [id number]   [place]: [id number]

## 2019-04-18 ENCOUNTER — Other Ambulatory Visit: Payer: Self-pay

## 2019-04-18 ENCOUNTER — Ambulatory Visit (HOSPITAL_COMMUNITY): Payer: Medicare Other

## 2019-04-18 ENCOUNTER — Telehealth: Payer: Self-pay | Admitting: Internal Medicine

## 2019-04-18 ENCOUNTER — Telehealth: Payer: Self-pay | Admitting: Family Medicine

## 2019-04-18 DIAGNOSIS — R1032 Left lower quadrant pain: Secondary | ICD-10-CM

## 2019-04-18 NOTE — Telephone Encounter (Signed)
Caller: Destenee N.Lenz  Phone :[phone]    Patient states that she is to have a CT on Monday 04/21/2019 and she takes Metformin Please Advise if patient can take and have CT  Patient would also like Dr Charlett Blake to order labs for B12.  Pt would also like to move labs from [place] to [facility] if possible . Labs for BUN and Creatinine   Please advise

## 2019-04-18 NOTE — Telephone Encounter (Signed)
Please advise 

## 2019-04-18 NOTE — Telephone Encounter (Signed)
Pt aware of recommendation. She would like it at [place] but they have nothing available. Scan has to have PA before can be scheduled. Spoke with Es and she is working on [place].  Pt scheduled for CT scan of a/p at [facility] today stat. Called pt and she states she will not go to [facility]. [facility]. [facility] cannot do the stat lab. Called PCP office Dr. Charlett Blake and her office states they do not have any lab appts left today. Pt to come here for lab, ct scheduled 04/21/19@10am , pt to arrive there at 9:45am. Pt instructions left at front desk for pt to pick up.

## 2019-04-18 NOTE — Telephone Encounter (Signed)
I recommend she get a CT abd/pelvis with contrast to see if she has diverticulitis  If she does then would treat w/ Abx  Dx can be LLQ pain and tenderness

## 2019-04-18 NOTE — Telephone Encounter (Signed)
Pt states she is having pain in her LLQ. States the pain has been constant for the last 3 days. She reports she is takgint her bentyl and it helps a little and then the pain comes back when the pill wears off. Pt wants to know if Dr. Carlean Purl feels she need an antibiotic. Please advise.

## 2019-04-18 NOTE — Telephone Encounter (Signed)
[name]  You have been scheduled for a CT scan of the abdomen and pelvis at [facility] on 04/21/19 at 10am. You need to arrive there at 9:45am. Please have nothing to eat or drink after 6am, except drink bottle 1 of contrast at 8am, bottle 2 at 9am.

## 2019-04-20 NOTE — Telephone Encounter (Signed)
I was unaware she was having a CT scan. Probably too late to move her creatinine and bun testing but theoretically fine with me to move labs. She can have her B12 checked with next lab but if she does not have a diagnosis of low vit B 12 already sometimes insurance does not cover, just warn her

## 2019-04-21 ENCOUNTER — Ambulatory Visit (HOSPITAL_BASED_OUTPATIENT_CLINIC_OR_DEPARTMENT_OTHER): Payer: Medicare Other

## 2019-04-21 ENCOUNTER — Other Ambulatory Visit (INDEPENDENT_AMBULATORY_CARE_PROVIDER_SITE_OTHER): Payer: Medicare Other

## 2019-04-21 ENCOUNTER — Telehealth: Payer: Self-pay | Admitting: Internal Medicine

## 2019-04-21 DIAGNOSIS — R1032 Left lower quadrant pain: Secondary | ICD-10-CM

## 2019-04-21 LAB — BUN: BUN: 18 mg/dL (ref 6–23)

## 2019-04-21 LAB — CREATININE, SERUM: Creatinine, Ser: 0.7 mg/dL (ref 0.40–1.20)

## 2019-04-21 NOTE — Telephone Encounter (Signed)
Called patient left vmail for patient to call the office back Looks like she may have canceled her CT

## 2019-04-21 NOTE — Telephone Encounter (Signed)
Patient is calling- she states that she was asked to come get blood work done Friday but did not come. She wants to get the lab work done in the med center in [place]. I let her know she was suppose to come to our lab in the basement. She wants to speak with Dr. Marla Roe nurse.

## 2019-04-21 NOTE — Telephone Encounter (Signed)
The patient was told to come to our lab for her CT blood work. She will be able to retrieve the contrast. She was also told to call and reschedule her CT appointment prior to her appt with Dr. Carlean Purl so he would have those results. The patient had several questions, all questions answered. Contrast with directions left at the front desk.

## 2019-04-25 ENCOUNTER — Ambulatory Visit (HOSPITAL_BASED_OUTPATIENT_CLINIC_OR_DEPARTMENT_OTHER)
Admission: RE | Admit: 2019-04-25 | Discharge: 2019-04-25 | Disposition: A | Discharge status: Home / Self Care | Payer: Medicare Other | Source: Ambulatory Visit | Attending: Internal Medicine | Admitting: Internal Medicine

## 2019-04-25 ENCOUNTER — Other Ambulatory Visit: Payer: Self-pay

## 2019-04-25 DIAGNOSIS — R1032 Left lower quadrant pain: Secondary | ICD-10-CM | POA: Diagnosis not present

## 2019-04-25 DIAGNOSIS — R109 Unspecified abdominal pain: Secondary | ICD-10-CM | POA: Diagnosis not present

## 2019-04-25 MED ORDER — IOHEXOL 300 MG/ML  SOLN
100.0000 mL | Freq: Once | INTRAMUSCULAR | Status: AC | PRN
  Administered 2019-04-25: 100 mL via INTRAVENOUS

## 2019-04-30 ENCOUNTER — Encounter: Payer: Self-pay | Admitting: Internal Medicine

## 2019-04-30 ENCOUNTER — Other Ambulatory Visit: Payer: Self-pay | Admitting: Family Medicine

## 2019-04-30 ENCOUNTER — Ambulatory Visit: Payer: Medicare Other | Admitting: Internal Medicine

## 2019-04-30 VITALS — BP 154/64 | HR 76 | Temp 98.7°F | Ht 61.0 in | Wt 170.0 lb

## 2019-04-30 DIAGNOSIS — R1010 Upper abdominal pain, unspecified: Secondary | ICD-10-CM

## 2019-04-30 DIAGNOSIS — K5730 Diverticulosis of large intestine without perforation or abscess without bleeding: Secondary | ICD-10-CM | POA: Diagnosis not present

## 2019-04-30 DIAGNOSIS — K581 Irritable bowel syndrome with constipation: Secondary | ICD-10-CM | POA: Diagnosis not present

## 2019-04-30 NOTE — Progress Notes (Signed)
[name] 79 y.o. [birth year] [id number]  Assessment & Plan:   Encounter Diagnoses  Name Primary?  . Diverticulosis of colon without hemorrhage Yes  . Irritable bowel syndrome with constipation   . Pain of upper abdomen    I reviewed her CT scan images and explained how she has muscular hypertrophy and a narrowed left colon that I think is the source of much of her problems leading to constipation and probably some of her abdominal pains.  Maybe all of it.  She does feel better with defecation.  She is reassured that she does not have diverticulitis.  We will try to continue Benefiber 1 tablespoon nightly and add a half dose of MiraLAX daily titrating up or down based upon results, with the goal of having several bowel movements a week and not having her discomfort.  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  She has been having increasing problems with constipation and left lower quadrant pain when she was in in February she said MiraLAX gave her diarrhea so she stopped it and I put her on Benefiber 1 tablespoon nightly.  She is moving her bowels 2 or 3 times a week, she did try some MiraLAX again and it did help her move her bowels several hours later.  She wonders if adding that back and would make a difference.  She still has left lower quadrant and then bilateral upper abdominal pain which are better after defecation. Allergies  Allergen Reactions  . Tramadol Other (See Comments)    Dizziness    Current Meds  Medication Sig  . amLODipine (NORVASC) 5 MG tablet TAKE 1 TABLET BY MOUTH  DAILY  . aspirin 81 MG tablet Take 81 mg by mouth daily.  . betamethasone valerate ointment  (VALISONE) 0.1 % Apply qod to affected area, small amount  . blood glucose meter kit and supplies Dispense based on patient and insurance preference.Check BS TID and PRN Dx:E119  . Blood Glucose Monitoring Suppl (ONETOUCH ULTRALINK) w/Device KIT Use to check blood sugars BID E11.69  . cholecalciferol (VITAMIN D) 1000 UNITS tablet Take 1,000 Units by mouth daily.    Marland Kitchen dicyclomine (BENTYL) 10 MG capsule TAKE ONE CAPSULE BY MOUTH 4 TIMES DAILY AS NEEDED  . folic acid-pyridoxine-cyancobalamin (FOLBIC) 2.5-25-2 MG TABS tablet Take 1 tab po M,W,F  . glucose blood test strip Use as instructedDispense based on patient and insurance preference.Check BS TID and PRN Dx:E119  . hydrALAZINE (APRESOLINE) 10 MG tablet Take 1 tablet (10 mg total) by mouth 2 (two) times daily. (Patient taking differently: Take 10 mg by mouth 2 (two) times daily as needed. )  . Lancets (ONETOUCH ULTRASOFT) lancets Use as instructedDispense based on patient and insurance preference.Check BS TID and PRN Dx:E119  . losartan (COZAAR) 50 MG tablet TAKE 1 TABLET BY MOUTH  TWICE DAILY  . metFORMIN (GLUCOPHAGE-XR) 500 MG 24 hr tablet TAKE 1 TABLET BY MOUTH 3  TIMES DAILY WITH MEALS  . metoCLOPramide (REGLAN) 5 MG tablet TAKE 1 TABLET BY MOUTH  TWICE DAILY  . metoprolol tartrate (LOPRESSOR) 50 MG tablet TAKE 1 AND 1/2 TABLETS BY  MOUTH TWICE DAILY  . NEXIUM 40 MG capsule Take 1  capsule (40 mg total) by mouth daily.  . ondansetron (ZOFRAN) 4 MG tablet Take 4 mg by mouth every 4 (four) hours as needed. For nausea    . potassium chloride (K-DUR) 10 MEQ tablet TAKE 1 TABLET BY MOUTH  DAILY  . rosuvastatin (CRESTOR) 10 MG tablet TAKE 1 TABLET BY MOUTH  DAILY  . sodium chloride (OCEAN) 0.65 % SOLN nasal spray Place 1 spray into both nostrils as needed for congestion.   Past Medical History:  Diagnosis Date  . Abdominal pain in female 03/18/2010   Qualifier: Diagnosis of  By: Carlean Purl MD, Dimas Millin Anemia 06/08/2014  . Anxiety   .  Arthritis    Spinal Osteoarthritis  . Cancer ([place])   . Carcinoid tumor of stomach   . Cataract   . Chest pain    Myoview 12/15 no ischemia.  . Chronic kidney disease    Left kidney smaller than right kidney  . Constipation 11/21/2016  . Diabetes mellitus type 2 in obese ([place]) 09/05/2006   Qualifier: Diagnosis of  By: Marca Ancona RMA, Lucy    . Diabetic peripheral neuropathy ([place]) 10/29/2013  . Encounter for preventative adult health care exam with abnormal findings 09/14/2013  . Esophageal reflux   . Gastric polyp    Fundic Gland  . Gastroparesis   . Headache(784.0)   . Heart murmur    Echocardiogram 2/11: EF 60-65%, mild LAE, grade 1 diastolic dysfunction, aortic valve sclerosis, mean gradient 9 mm of mercury, PASP 34  . Hematuria 03/16/2016  . Iron deficiency anemia, unspecified   . 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 Leg swelling    bilateral  . Neck pain 04/22/2015  . PONV (postoperative nausea and vomiting)    pt states only needs small amount of anesthesia  . PSVT (paroxysmal supraventricular tachycardia) ([place])   . Pure hypercholesterolemia   . 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 Stroke ([place])    tia, 2014  . 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 Type II or unspecified type diabetes mellitus without mention of complication, not stated as uncontrolled   . Unspecified essential hypertension   . Unspecified hereditary and idiopathic peripheral neuropathy 10/29/2013   Past Surgical History:  Procedure Laterality Date  . CHOLECYSTECTOMY  1993  . COLONOSCOPY  11/11/2010   diverticulosis  . DILATATION & CURRETTAGE/HYSTEROSCOPY WITH RESECTOCOPE N/A 02/25/2013   Procedure: Attempted hysteroscopy with uterine perforation;  Surgeon: Jamey Reas de Berton Lan, MD;  Location: [facility];  Service: Gynecology;  Laterality: N/A;  . ESOPHAGOGASTRODUODENOSCOPY  08/29/2010; 09/15/2010   Carcinoid tumor less than 1 cm in July 2012 not seen in August 2012 , gastritis, fundic gland polyps  . ESOPHAGOGASTRODUODENOSCOPY   05/16/2011  . ESOPHAGOGASTRODUODENOSCOPY  06/14/2012  . EUS  12/15/2010   Procedure: UPPER ENDOSCOPIC ULTRASOUND (EUS) LINEAR;  Surgeon: Owens Loffler, MD;  Location: [facility];  Service: Endoscopy;  Laterality: N/A;  . EYE SURGERY Bilateral    Bi lateral cateracts and bi lateral laser  . LAPAROSCOPY N/A 02/25/2013   Procedure: Cystoscopy and laparoscopy with fulguration of uterine serosa;  Surgeon: Jamey Reas de Berton Lan, MD;  Location: [facility];  Service: Gynecology;  Laterality: N/A;  . 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   Patient is right-handed.   Patient has a BA degree.   One caffeine drink daily    family  history includes Diabetes in her brother, maternal grandmother, mother, sister, and sister; Heart disease in her brother, brother, sister, sister, and sister; Hyperlipidemia in her brother, sister, and sister; Hypertension in her brother, paternal grandmother, sister, and sister; Stroke in her father.   Review of Systems As per HPI  Objective:   Physical Exam BP (!) 154/64   Pulse 76   Temp 98.7 F (37.1 C)   Ht 5' 1"  (1.549 m)   Wt 170 lb (77.1 kg)   LMP 02/07/1992   BMI 32.12 kg/m   22 minutes total time

## 2019-04-30 NOTE — Patient Instructions (Signed)
  Please continue your Benefiber one tablespoon daily.   Take 1/2 dose of Miralax daily. You may need more or less, adjust the miralax so that you have enough bowel movements a week to feel comfortable.   Come back and see Dr Carlean Purl in 6 months or sooner if needed.    I appreciate the opportunity to care for you. Silvano Rusk, MD, [facility]

## 2019-05-01 ENCOUNTER — Other Ambulatory Visit (HOSPITAL_BASED_OUTPATIENT_CLINIC_OR_DEPARTMENT_OTHER): Payer: Self-pay | Admitting: Family Medicine

## 2019-05-01 DIAGNOSIS — Z1231 Encounter for screening mammogram for malignant neoplasm of breast: Secondary | ICD-10-CM

## 2019-05-07 ENCOUNTER — Ambulatory Visit (HOSPITAL_BASED_OUTPATIENT_CLINIC_OR_DEPARTMENT_OTHER): Payer: Medicare Other

## 2019-05-08 ENCOUNTER — Other Ambulatory Visit: Payer: Self-pay

## 2019-05-08 ENCOUNTER — Ambulatory Visit (HOSPITAL_BASED_OUTPATIENT_CLINIC_OR_DEPARTMENT_OTHER)
Admission: RE | Admit: 2019-05-08 | Discharge: 2019-05-08 | Disposition: A | Discharge status: Home / Self Care | Payer: Medicare Other | Source: Ambulatory Visit | Attending: Family Medicine | Admitting: Family Medicine

## 2019-05-08 DIAGNOSIS — Z1231 Encounter for screening mammogram for malignant neoplasm of breast: Secondary | ICD-10-CM | POA: Insufficient documentation

## 2019-05-15 DIAGNOSIS — Z7189 Other specified counseling: Secondary | ICD-10-CM | POA: Insufficient documentation

## 2019-05-15 NOTE — Progress Notes (Signed)
Cardiology Office Note   Date:  05/16/2019   ID:  [name], DOB 11-Apr-1940, MRN [id number]  PCP:  [name], MD  Cardiologist:   Minus Breeding, MD   Chief Complaint  Patient presents with  . Muscle Pain      History of Present Illness: [name] is a 79 y.o. female who  presents for followup of SVT. Since I last saw her she has had no new cardiovascular symptoms.  She does some shopping now that she is vaccinated.  She gets rare palpitations but did not sustain.  She denies any chest pressure, neck or arm discomfort.  She has no new shortness of breath, PND or orthopnea.  She has lots of muscle aches and pains.   Past Medical History:  Diagnosis Date  . Abdominal pain in female 03/18/2010   Qualifier: Diagnosis of  By: Valerie Purl MD, [name] Anemia 06/08/2014  . Anxiety   . Arthritis    Spinal Osteoarthritis  . Cancer ([place])   . Carcinoid tumor of stomach   . Cataract   . Chest pain    Myoview 12/15 no ischemia.  . Chronic kidney disease    Left kidney smaller than right kidney  . Constipation 11/21/2016  . Diabetes mellitus type 2 in obese ([place]) 09/05/2006   Qualifier: Diagnosis of  By: Marca [name] RMA, [name]    . Diabetic peripheral neuropathy ([place]) 10/29/2013  . Encounter for preventative adult health care exam with abnormal findings 09/14/2013  . Esophageal reflux   . Gastric polyp    Fundic Gland  . Gastroparesis   . Headache(784.0)   . Heart murmur    Echocardiogram 2/11: EF 60-65%, mild LAE, grade 1 diastolic dysfunction, aortic valve sclerosis, mean gradient 9 mm of mercury, PASP 34  . Hematuria 03/16/2016  . Iron deficiency anemia, unspecified   . 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 Leg swelling    bilateral  . Neck pain 04/22/2015  . PONV (postoperative nausea and vomiting)    pt states only needs small amount of anesthesia  . PSVT (paroxysmal supraventricular tachycardia) ([place])   . Pure hypercholesterolemia   . Recurrent UTI  01/11/2016  . Stroke ([place])    tia, 2014  . 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 Type II or unspecified type diabetes mellitus without mention of complication, not stated as uncontrolled   . Unspecified essential hypertension   . Unspecified hereditary and idiopathic peripheral neuropathy 10/29/2013    Past Surgical History:  Procedure Laterality Date  . CHOLECYSTECTOMY  1993  . COLONOSCOPY  11/11/2010   diverticulosis  . DILATATION & CURRETTAGE/HYSTEROSCOPY WITH RESECTOCOPE N/A 02/25/2013   Procedure: Attempted hysteroscopy with uterine perforation;  Surgeon: Jamey Reas de Berton Lan, MD;  Location: [facility];  Service: Gynecology;  Laterality: N/A;  . ESOPHAGOGASTRODUODENOSCOPY  08/29/2010; 09/15/2010   Carcinoid tumor less than 1 cm in July 2012 not seen in August 2012 , gastritis, fundic gland polyps  . ESOPHAGOGASTRODUODENOSCOPY  05/16/2011  . ESOPHAGOGASTRODUODENOSCOPY  06/14/2012  . EUS  12/15/2010   Procedure: UPPER ENDOSCOPIC ULTRASOUND (EUS) LINEAR;  Surgeon: Owens Loffler, MD;  Location: [facility];  Service: Endoscopy;  Laterality: N/A;  . EYE SURGERY Bilateral    Bi lateral cateracts and bi lateral laser  . LAPAROSCOPY N/A 02/25/2013   Procedure: Cystoscopy and laparoscopy with fulguration of uterine serosa;  Surgeon: Jamey Reas de Berton Lan, MD;  Location: [facility];  Service: Gynecology;  Laterality: N/A;  . TONSILLECTOMY       Current Outpatient Medications  Medication Sig Dispense Refill  . amLODipine (NORVASC) 5 MG tablet TAKE 1 TABLET BY MOUTH  DAILY 90 tablet 3  . aspirin 81 MG tablet Take 81 mg by mouth daily.    . betamethasone valerate ointment (VALISONE) 0.1 % Apply qod to affected area, small amount 45 g 1  . blood glucose meter kit and supplies Dispense based on patient and insurance preference.Check BS TID and PRN Dx:E119 1 each 0  . Blood Glucose Monitoring Suppl (ONETOUCH ULTRALINK) w/Device KIT Use to check blood sugars BID E11.69 1 kit 0  .  cholecalciferol (VITAMIN D) 1000 UNITS tablet Take 1,000 Units by mouth daily.      Marland Kitchen dicyclomine (BENTYL) 10 MG capsule TAKE ONE CAPSULE BY MOUTH 4 TIMES DAILY AS NEEDED 161 capsule 1  . folic acid-pyridoxine-cyancobalamin (FOLBIC) 2.5-25-2 MG TABS tablet Take 1 tab po M,W,F 90 tablet 1  . glucose blood test strip Use as instructedDispense based on patient and insurance preference.Check BS TID and PRN Dx:E119 100 each 12  . hydrALAZINE (APRESOLINE) 10 MG tablet Take 1 tablet (10 mg total) by mouth 2 (two) times daily. (Patient taking differently: Take 10 mg by mouth 2 (two) times daily as needed. ) 60 tablet 3  . Lancets (ONETOUCH ULTRASOFT) lancets Use as instructedDispense based on patient and insurance preference.Check BS TID and PRN Dx:E119 100 each 12  . losartan (COZAAR) 50 MG tablet TAKE 1 TABLET BY MOUTH  TWICE DAILY 180 tablet 3  . metFORMIN (GLUCOPHAGE-XR) 500 MG 24 hr tablet TAKE 1 TABLET BY MOUTH 3  TIMES DAILY WITH MEALS 270 tablet 3  . metoCLOPramide (REGLAN) 5 MG tablet TAKE 1 TABLET BY MOUTH  TWICE DAILY 180 tablet 0  . metoprolol tartrate (LOPRESSOR) 50 MG tablet TAKE 1 AND 1/2 TABLETS BY  MOUTH TWICE DAILY 270 tablet 3  . NEXIUM 40 MG capsule Take 1 capsule (40 mg total) by mouth daily. 90 capsule 3  . ondansetron (ZOFRAN) 4 MG tablet Take 4 mg by mouth every 4 (four) hours as needed. For nausea      . potassium chloride (K-DUR) 10 MEQ tablet TAKE 1 TABLET BY MOUTH  DAILY 90 tablet 2  . rosuvastatin (CRESTOR) 10 MG tablet TAKE 1 TABLET BY MOUTH  DAILY 90 tablet 3  . sodium chloride (OCEAN) 0.65 % SOLN nasal spray Place 1 spray into both nostrils as needed for congestion. 15 mL 0   No current facility-administered medications for this visit.    Allergies:   Tramadol    ROS:  Please see the history of present illness.   Otherwise, review of systems are positive for none.   All other systems are reviewed and negative.    PHYSICAL EXAM: VS:  BP (!) 160/68   Pulse 67   Temp  (!) 97.2 F (36.2 C)   Ht 5' 1"  (1.549 m)   Wt 170 lb 3.2 oz (77.2 kg)   LMP 02/07/1992   SpO2 98%   BMI 32.16 kg/m  , BMI Body mass index is 32.16 kg/m. GENERAL:  Well appearing NECK:  No jugular venous distention, waveform within normal limits, carotid upstroke brisk and symmetric, no bruits, no thyromegaly LUNGS:  Clear to auscultation bilaterally CHEST:  Unremarkable HEART:  PMI not displaced or sustained,S1 and S2 within normal limits, no S3, no S4, no clicks, no rubs, no murmurs ABD:  Flat, positive bowel sounds normal in  frequency in pitch, no bruits, no rebound, no guarding, no midline pulsatile mass, no hepatomegaly, no splenomegaly EXT:  2 plus pulses throughout, no edema, no cyanosis no clubbing    EKG:  EKG is  ordered today. The ekg ordered today demonstrates sinus rhythm, rate 67, axis within normal limits, intervals within normal limits, no acute ST-T wave changes.   Recent Labs: 02/18/2019: ALT 21; Hemoglobin 12.7; Platelets 216.0; Potassium 4.2; Sodium 137; TSH 3.08 04/21/2019: BUN 18; Creatinine, Ser 0.70    Lipid Panel    Component Value Date/Time   CHOL 185 02/18/2019 0907   TRIG 158.0 (H) 02/18/2019 0907   TRIG 94 11/16/2005 0905   HDL 61.10 02/18/2019 0907   CHOLHDL 3 02/18/2019 0907   VLDL 31.6 02/18/2019 0907   LDLCALC 93 02/18/2019 0907   LDLDIRECT 123.7 04/13/2011 0848      Wt Readings from Last 3 Encounters:  05/16/19 170 lb 3.2 oz (77.2 kg)  04/30/19 170 lb (77.1 kg)  03/31/19 168 lb (76.2 kg)      Other studies Reviewed: Additional studies/ records that were reviewed today include: Labs. Review of the above records demonstrates:  Please see elsewhere in the note.     ASSESSMENT AND PLAN:   SVT:    She has had no symptomatic recurrence of this.  No change in therapy.   Hypertension- Her blood pressure is mildly elevated in the morning only.  It goes down and it is always in the 120s in the afternoon.  In fact she thinks  sometimes it is a little low and she wants to cut down her medications.  I convinced her to at least continue to take which she is taking but she would not allow up titration.   Dyslipidemia - Her LDL is 93 with an HDL of 69.  No change in therapy.  Was up to 175 but she was not taking her Crestor because of some GI complaints.  She started taking this again.  She is getting get checked again tomorrow.  If her LDL is not less than 100 I would suggest that she take 20 mg of Crestor.   Carotid stenosis - This was mild in 2019.  No change in therapy.   Aortic stenosis- This is very mild in the past and not evident on the most recent echo.  No change in therapy.   Aortic atherosclerosis - She will continue with risk reduction as above.   Covid education - She has been vaccinated.  Current medicines are reviewed at length with the patient today.  The patient does not have concerns regarding medicines.  The following changes have been made:  no change  Labs/ tests ordered today include:   Orders Placed This Encounter  Procedures  . EKG 12-Lead     Disposition:   FU with me in six months.     Signed, Minus Breeding, MD  05/16/2019 3:39 PM    [facility]

## 2019-05-16 ENCOUNTER — Other Ambulatory Visit: Payer: Self-pay

## 2019-05-16 ENCOUNTER — Encounter: Payer: Self-pay | Admitting: Cardiology

## 2019-05-16 ENCOUNTER — Ambulatory Visit: Payer: Medicare Other | Admitting: Cardiology

## 2019-05-16 VITALS — BP 160/68 | HR 67 | Temp 97.2°F | Ht 61.0 in | Wt 170.2 lb

## 2019-05-16 DIAGNOSIS — I350 Nonrheumatic aortic (valve) stenosis: Secondary | ICD-10-CM | POA: Diagnosis not present

## 2019-05-16 DIAGNOSIS — I471 Supraventricular tachycardia: Secondary | ICD-10-CM

## 2019-05-16 DIAGNOSIS — I10 Essential (primary) hypertension: Secondary | ICD-10-CM | POA: Diagnosis not present

## 2019-05-16 DIAGNOSIS — I700 Atherosclerosis of aorta: Secondary | ICD-10-CM | POA: Diagnosis not present

## 2019-05-16 DIAGNOSIS — E785 Hyperlipidemia, unspecified: Secondary | ICD-10-CM | POA: Diagnosis not present

## 2019-05-16 DIAGNOSIS — Z7189 Other specified counseling: Secondary | ICD-10-CM

## 2019-05-16 DIAGNOSIS — R072 Precordial pain: Secondary | ICD-10-CM

## 2019-05-16 NOTE — Patient Instructions (Signed)
Medication Instructions:  °No changes °*If you need a refill on your cardiac medications before your next appointment, please call your pharmacy* ° °Lab Work: °None ordered this visit ° °Testing/Procedures: °None ordered this visit ° °Follow-Up: °At [facility], you and your health needs are our priority.  As part of our continuing mission to provide you with exceptional heart care, we have created designated Provider Care Teams.  These Care Teams include your primary Cardiologist (physician) and Advanced Practice Providers (APPs -  Physician Assistants and Nurse Practitioners) who all work together to provide you with the care you need, when you need it. ° °Your next appointment:   °6 month(s)  °You will receive a reminder letter in the mail two months in advance. If you don't receive a letter, please call our office to schedule the follow-up appointment. ° °The format for your next appointment:   °In Person ° °Provider:   °James Hochrein, MD ° °

## 2019-06-02 ENCOUNTER — Telehealth: Payer: Self-pay | Admitting: Family Medicine

## 2019-06-02 DIAGNOSIS — I10 Essential (primary) hypertension: Secondary | ICD-10-CM

## 2019-06-02 DIAGNOSIS — E1169 Type 2 diabetes mellitus with other specified complication: Secondary | ICD-10-CM

## 2019-06-02 DIAGNOSIS — E7800 Pure hypercholesterolemia, unspecified: Secondary | ICD-10-CM

## 2019-06-02 DIAGNOSIS — E669 Obesity, unspecified: Secondary | ICD-10-CM

## 2019-06-02 NOTE — Telephone Encounter (Signed)
Caller: H[place]  Call Back # [phone]  Requesting blood work order.

## 2019-06-02 NOTE — Telephone Encounter (Signed)
Try and find her an appt in May. Can be late on a Tuesday or Thursday. Can do labs at appointment or prior it is up to her. Order same set of labs as January

## 2019-06-02 NOTE — Telephone Encounter (Signed)
Spoke with patient and she stated that she usually sees you every 3 months and she stated that July (which is your next open in person appointment) is too far out.  She declined appointment for July until you advise on what she needs to do.

## 2019-06-02 NOTE — Telephone Encounter (Signed)
Patient was last seen in December and labs drawn in January.  No follow up appt made.  Patient now wants orders.  Do you want to order anything specific for her? Also when did you wanted to see her back?

## 2019-06-03 NOTE — Telephone Encounter (Signed)
Spoke with patient and appointment made for may and lab appointment made for week prior.

## 2019-06-13 ENCOUNTER — Other Ambulatory Visit (INDEPENDENT_AMBULATORY_CARE_PROVIDER_SITE_OTHER): Payer: Medicare Other

## 2019-06-13 ENCOUNTER — Other Ambulatory Visit: Payer: Self-pay

## 2019-06-13 DIAGNOSIS — E1169 Type 2 diabetes mellitus with other specified complication: Secondary | ICD-10-CM

## 2019-06-13 DIAGNOSIS — E669 Obesity, unspecified: Secondary | ICD-10-CM

## 2019-06-13 DIAGNOSIS — I10 Essential (primary) hypertension: Secondary | ICD-10-CM

## 2019-06-13 DIAGNOSIS — E7800 Pure hypercholesterolemia, unspecified: Secondary | ICD-10-CM | POA: Diagnosis not present

## 2019-06-13 LAB — CBC
HCT: 37.1 % (ref 36.0–46.0)
Hemoglobin: 12.7 g/dL (ref 12.0–15.0)
MCHC: 34.2 g/dL (ref 30.0–36.0)
MCV: 85.6 fl (ref 78.0–100.0)
Platelets: 208 10*3/uL (ref 150.0–400.0)
RBC: 4.33 Mil/uL (ref 3.87–5.11)
RDW: 12.8 % (ref 11.5–15.5)
WBC: 9.1 10*3/uL (ref 4.0–10.5)

## 2019-06-13 LAB — COMPREHENSIVE METABOLIC PANEL
ALT: 25 U/L (ref 0–35)
AST: 20 U/L (ref 0–37)
Albumin: 4.3 g/dL (ref 3.5–5.2)
Alkaline Phosphatase: 61 U/L (ref 39–117)
BUN: 25 mg/dL — ABNORMAL HIGH (ref 6–23)
CO2: 30 mEq/L (ref 19–32)
Calcium: 10 mg/dL (ref 8.4–10.5)
Chloride: 100 mEq/L (ref 96–112)
Creatinine, Ser: 0.85 mg/dL (ref 0.40–1.20)
GFR: 64.65 mL/min (ref >60.00)
Glucose, Bld: 116 mg/dL — ABNORMAL HIGH (ref 70–99)
Potassium: 4.3 mEq/L (ref 3.5–5.1)
Sodium: 136 mEq/L (ref 135–145)
Total Bilirubin: 0.7 mg/dL (ref 0.2–1.2)
Total Protein: 7 g/dL (ref 6.0–8.3)

## 2019-06-13 LAB — TSH: TSH: 1.85 u[IU]/mL (ref 0.35–4.50)

## 2019-06-13 LAB — LIPID PANEL
Cholesterol: 209 mg/dL — ABNORMAL HIGH (ref 0–200)
HDL: 61.9 mg/dL (ref >39.00)
LDL Cholesterol: 120 mg/dL — ABNORMAL HIGH (ref 0–99)
NonHDL: 146.94
Total CHOL/HDL Ratio: 3
Triglycerides: 136 mg/dL (ref 0.0–149.0)
VLDL: 27.2 mg/dL (ref 0.0–40.0)

## 2019-06-13 LAB — HEMOGLOBIN A1C: Hgb A1c MFr Bld: 6.5 % (ref 4.6–6.5)

## 2019-06-17 ENCOUNTER — Ambulatory Visit (INDEPENDENT_AMBULATORY_CARE_PROVIDER_SITE_OTHER): Payer: Medicare Other | Admitting: Family Medicine

## 2019-06-17 ENCOUNTER — Other Ambulatory Visit: Payer: Self-pay

## 2019-06-17 VITALS — BP 118/58 | HR 67 | Temp 97.2°F | Resp 12 | Ht 61.0 in | Wt 166.6 lb

## 2019-06-17 DIAGNOSIS — E559 Vitamin D deficiency, unspecified: Secondary | ICD-10-CM | POA: Diagnosis not present

## 2019-06-17 DIAGNOSIS — E538 Deficiency of other specified B group vitamins: Secondary | ICD-10-CM | POA: Diagnosis not present

## 2019-06-17 DIAGNOSIS — I6529 Occlusion and stenosis of unspecified carotid artery: Secondary | ICD-10-CM

## 2019-06-17 DIAGNOSIS — R42 Dizziness and giddiness: Secondary | ICD-10-CM

## 2019-06-17 DIAGNOSIS — N289 Disorder of kidney and ureter, unspecified: Secondary | ICD-10-CM | POA: Diagnosis not present

## 2019-06-17 DIAGNOSIS — N390 Urinary tract infection, site not specified: Secondary | ICD-10-CM | POA: Diagnosis not present

## 2019-06-17 MED ORDER — ROSUVASTATIN CALCIUM 10 MG PO TABS: ORAL_TABLET | ORAL | 1 refills | Status: DC

## 2019-06-17 MED ORDER — ONETOUCH ULTRA VI STRP: ORAL_STRIP | 1 refills | Status: DC

## 2019-06-17 NOTE — Assessment & Plan Note (Signed)
Labs reveal deficiency. Start on Vitamin D 50000 IU caps, 1 cap po weekly x 12 weeks. Disp #4 with 4 rf. Also take daily Vitamin D over the counter. If already taking a daily supplement increase by 1000 IU daily and if not start Vitamin D 2000 IU daily.  

## 2019-06-17 NOTE — Patient Instructions (Signed)
The mRNA technology has been in development for 20 years and we already had the Coronavirus family of viruses (which usually just cause the common cold) genetically mapped already which is why we were able to come up with viable vaccine candidates so quickly in stage 1, then stage 2 scientifically took the correct amount of time what we did to speed it up was just build the manufacturing platform at the same time we were running the experiments so if it worked we could produce faster. And stage 3 has now had many months and millions of people immunized and we are seeing the immunity hold for over 9 months now with sign of it dissipating and no significant numbers of adverse reactions.  During every flu season we see 2 anaphylactic reactions for every million shots given and we initially thought we would see 11 per million with the COVID vaccine but now we see only 2-3 with Moderna and 5 or so with [place] so compared to someone is dying every 20 minutes from [place] and more deadly and infectious strains are coming it is definitely best when weighing the risks and benefits to take the shots.  Another pooled analysis of the 5 most utilized vaccines in the world shows that after full immunization so far no one has died from [place].

## 2019-06-18 ENCOUNTER — Encounter: Payer: Self-pay | Admitting: Family Medicine

## 2019-06-18 DIAGNOSIS — N289 Disorder of kidney and ureter, unspecified: Secondary | ICD-10-CM

## 2019-06-18 HISTORY — DX: Disorder of kidney and ureter, unspecified: N28.9

## 2019-06-18 LAB — VITAMIN D 25 HYDROXY (VIT D DEFICIENCY, FRACTURES): VITD: 63.04 ng/mL (ref 30.00–100.00)

## 2019-06-18 LAB — VITAMIN B12: Vitamin B-12: 379 pg/mL (ref 211–911)

## 2019-06-18 NOTE — Assessment & Plan Note (Signed)
Check UA and culture 

## 2019-06-18 NOTE — Progress Notes (Signed)
Subjective:    [name], female    DOB: 04-11-1940, 79 y.o.   MRN: [id number]  Chief Complaint  Patient presents with  . Follow-up    HPI Patient is in today for follow up on chronic medical concerns. She is staying in and trying to maintain quarantine. Is seeing some family more now that they are vaccinated. No recent febrile illness or hospitalizations. Denies CP/palp/SOB/HA/congestion/fevers/GI or GU c/o. Taking meds as prescribed  Past Medical History:  Diagnosis Date  . Abdominal pain in female 03/18/2010   Qualifier: Diagnosis of  By: Carlean Purl MD, Dimas Millin Anemia 06/08/2014  . Anxiety   . Arthritis    Spinal Osteoarthritis  . Cancer ([place])   . Carcinoid tumor of stomach   . Cataract   . Chest pain    Myoview 12/15 no ischemia.  . Chronic kidney disease    Left kidney smaller than right kidney  . Constipation 11/21/2016  . Diabetes mellitus type 2 in obese ([place]) 09/05/2006   Qualifier: Diagnosis of  By: Marca Ancona RMA, Lucy    . Diabetic peripheral neuropathy ([place]) 10/29/2013  . Encounter for preventative adult health care exam with abnormal findings 09/14/2013  . Esophageal reflux   . Gastric polyp    Fundic Gland  . Gastroparesis   . Headache(784.0)   . Heart murmur    Echocardiogram 2/11: EF 60-65%, mild LAE, grade 1 diastolic dysfunction, aortic valve sclerosis, mean gradient 9 mm of mercury, PASP 34  . Hematuria 03/16/2016  . Iron deficiency anemia, unspecified   . 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 Leg swelling    bilateral  . Neck pain 04/22/2015  . PONV (postoperative nausea and vomiting)    pt states only needs small amount of anesthesia  . PSVT (paroxysmal supraventricular tachycardia) ([place])   . Pure hypercholesterolemia   . 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 Renal insufficiency 06/18/2019  . Stroke ([place])    tia, 2014  . 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 Type II or unspecified type diabetes mellitus without mention of complication, not stated as  uncontrolled   . Unspecified essential hypertension   . Unspecified hereditary and idiopathic peripheral neuropathy 10/29/2013    Past Surgical History:  Procedure Laterality Date  . CHOLECYSTECTOMY  1993  . COLONOSCOPY  11/11/2010   diverticulosis  . DILATATION & CURRETTAGE/HYSTEROSCOPY WITH RESECTOCOPE N/A 02/25/2013   Procedure: Attempted hysteroscopy with uterine perforation;  Surgeon: Jamey Reas de Berton Lan, MD;  Location: [facility];  Service: Gynecology;  Laterality: N/A;  . ESOPHAGOGASTRODUODENOSCOPY  08/29/2010; 09/15/2010   Carcinoid tumor less than 1 cm in July 2012 not seen in August 2012 , gastritis, fundic gland polyps  . ESOPHAGOGASTRODUODENOSCOPY  05/16/2011  . ESOPHAGOGASTRODUODENOSCOPY  06/14/2012  . EUS  12/15/2010   Procedure: UPPER ENDOSCOPIC ULTRASOUND (EUS) LINEAR;  Surgeon: Owens Loffler, MD;  Location: [facility];  Service: Endoscopy;  Laterality: N/A;  . EYE SURGERY Bilateral    Bi lateral cateracts and bi lateral laser  . LAPAROSCOPY N/A 02/25/2013   Procedure: Cystoscopy and laparoscopy with fulguration of uterine serosa;  Surgeon: Jamey Reas de Berton Lan, MD;  Location: [facility];  Service: Gynecology;  Laterality: N/A;  . TONSILLECTOMY      Family History  Problem Relation Age of Onset  . Diabetes Mother   . Stroke Father        deceased age 67  . Heart disease Sister  deceased MI age 61  . Heart disease Brother        deceased MI age 30  . Diabetes Maternal Grandmother   . Hypertension Paternal Grandmother   . Diabetes Sister   . Heart disease Sister   . Hypertension Sister   . Hyperlipidemia Sister   . Diabetes Sister   . Heart disease Sister   . Hypertension Sister   . Hyperlipidemia Sister   . Diabetes Brother   . Heart disease Brother   . Hypertension Brother   . Hyperlipidemia Brother   . Colon cancer Neg Hx   . Esophageal cancer Neg Hx   . Stomach cancer Neg Hx   . Rectal cancer Neg Hx     Social History    Socioeconomic History  . Marital status: Widowed    Spouse name: Not on file  . Number of children: 0  . Years of education: college  . Highest education level: Not on file  Occupational History  . Occupation: retired  Tobacco Use  . Smoking status: Never Smoker  . Smokeless tobacco: Never Used  . Tobacco comment: Never used tobacco  Substance and Sexual Activity  . Alcohol use: No    Alcohol/week: 0.0 standard drinks  . Drug use: No  . Sexual activity: Never    Partners: Male    Birth control/protection: Post-menopausal    Comment: lives alone, no dietary restrictions except avoid fresh veg, fruit, whole grains  Other Topics Concern  . Not on file  Social History Narrative   Patient was married ([name]) - widow   Patient does not have any children.   Patient is right-handed.   Patient has a BA degree.   One caffeine drink daily    Social Determinants of Health   Financial Resource Strain:   . Difficulty of Paying Living Expenses:   Food Insecurity:   . Worried About Charity fundraiser in the Last Year:   . Arboriculturist in the Last Year:   Transportation Needs:   . Film/video editor (Medical):   Marland Kitchen Lack of Transportation (Non-Medical):   Physical Activity:   . Days of Exercise per Week:   . Minutes of Exercise per Session:   Stress:   . Feeling of Stress :   Social Connections:   . Frequency of Communication with Friends and Family:   . Frequency of Social Gatherings with Friends and Family:   . Attends Religious Services:   . Active Member of Clubs or Organizations:   . Attends Archivist Meetings:   Marland Kitchen Marital Status:   Intimate Partner Violence:   . Fear of Current or Ex-Partner:   . Emotionally Abused:   Marland Kitchen Physically Abused:   . Sexually Abused:     Outpatient Medications Prior to Visit  Medication Sig Dispense Refill  . amLODipine (NORVASC) 5 MG tablet TAKE 1 TABLET BY MOUTH  DAILY 90 tablet 3  . aspirin 81 MG tablet Take 81 mg by  mouth daily.    . Blood Glucose Monitoring Suppl (ONETOUCH ULTRALINK) w/Device KIT Use to check blood sugars BID E11.69 1 kit 0  . cholecalciferol (VITAMIN D) 1000 UNITS tablet Take 1,000 Units by mouth daily.      Marland Kitchen dicyclomine (BENTYL) 10 MG capsule TAKE ONE CAPSULE BY MOUTH 4 TIMES DAILY AS NEEDED 120 capsule 1  . hydrALAZINE (APRESOLINE) 10 MG tablet Take 1 tablet (10 mg total) by mouth 2 (two) times daily. (Patient taking differently: Take  10 mg by mouth 2 (two) times daily as needed. ) 60 tablet 3  . Lancets (ONETOUCH ULTRASOFT) lancets Use as instructedDispense based on patient and insurance preference.Check BS TID and PRN Dx:E119 100 each 12  . losartan (COZAAR) 50 MG tablet TAKE 1 TABLET BY MOUTH  TWICE DAILY 180 tablet 3  . metFORMIN (GLUCOPHAGE-XR) 500 MG 24 hr tablet TAKE 1 TABLET BY MOUTH 3  TIMES DAILY WITH MEALS 270 tablet 3  . metoCLOPramide (REGLAN) 5 MG tablet TAKE 1 TABLET BY MOUTH  TWICE DAILY 180 tablet 0  . metoprolol tartrate (LOPRESSOR) 50 MG tablet TAKE 1 AND 1/2 TABLETS BY  MOUTH TWICE DAILY 270 tablet 3  . NEXIUM 40 MG capsule Take 1 capsule (40 mg total) by mouth daily. 90 capsule 3  . ondansetron (ZOFRAN) 4 MG tablet Take 4 mg by mouth every 4 (four) hours as needed. For nausea      . potassium chloride (K-DUR) 10 MEQ tablet TAKE 1 TABLET BY MOUTH  DAILY 90 tablet 2  . sodium chloride (OCEAN) 0.65 % SOLN nasal spray Place 1 spray into both nostrils as needed for congestion. 15 mL 0  . glucose blood test strip Use as instructedDispense based on patient and insurance preference.Check BS TID and PRN Dx:E119 100 each 12  . rosuvastatin (CRESTOR) 10 MG tablet TAKE 1 TABLET BY MOUTH  DAILY 90 tablet 3  . betamethasone valerate ointment (VALISONE) 0.1 % Apply qod to affected area, small amount (Patient not taking: Reported on 05/30/5359) 45 g 1  . folic acid-pyridoxine-cyancobalamin (FOLBIC) 2.5-25-2 MG TABS tablet Take 1 tab po M,W,F (Patient not taking: Reported on 06/17/2019)  90 tablet 1  . blood glucose meter kit and supplies Dispense based on patient and insurance preference.Check BS TID and PRN Dx:E119 1 each 0   No facility-administered medications prior to visit.    Allergies  Allergen Reactions  . Tramadol Other (See Comments)    Dizziness     Review of Systems  Constitutional: Negative for fever.  HENT: Negative for congestion.   Eyes: Negative for blurred vision.  Respiratory: Negative for cough.   Cardiovascular: Negative for chest pain and palpitations.  Gastrointestinal: Negative for vomiting.  Musculoskeletal: Negative for back pain.  Skin: Negative for rash.  Neurological: Negative for loss of consciousness and headaches.       Objective:    Physical Exam Vitals and nursing note reviewed.  Constitutional:      General: She is not in acute distress.    Appearance: She is well-developed.  HENT:     Head: Normocephalic and atraumatic.     Nose: Nose normal.  Eyes:     General:        Right eye: No discharge.        Left eye: No discharge.  Cardiovascular:     Rate and Rhythm: Normal rate and regular rhythm.     Heart sounds: No murmur.  Pulmonary:     Effort: Pulmonary effort is normal.     Breath sounds: Normal breath sounds.  Abdominal:     General: Bowel sounds are normal.     Palpations: Abdomen is soft.     Tenderness: There is no abdominal tenderness.  Musculoskeletal:     Cervical back: Normal range of motion and neck supple.  Skin:    General: Skin is warm and dry.  Neurological:     Mental Status: She is alert and oriented to person, place, and time.  BP (!) 118/58 (BP Location: Left Arm, Cuff Size: Large)   Pulse 67   Temp (!) 97.2 F (36.2 C) (Temporal)   Resp 12   Ht _0  (1.549 m)   Wt 166 lb 9.6 oz (75.6 kg)   LMP 02/07/1992   SpO2 98%   BMI 31.48 kg/m  Wt Readings from Last 3 Encounters:  06/17/19 166 lb 9.6 oz (75.6 kg)  05/16/19 170 lb 3.2 oz (77.2 kg)  04/30/19 170 lb (77.1 kg)     Diabetic Foot Exam - Simple   No data filed     Lab Results  Component Value Date   WBC 9.1 06/13/2019   HGB 12.7 06/13/2019   HCT 37.1 06/13/2019   PLT 208.0 06/13/2019   GLUCOSE 116 (H) 06/13/2019   CHOL 209 (H) 06/13/2019   TRIG 136.0 06/13/2019   HDL 61.90 06/13/2019   LDLDIRECT 123.7 04/13/2011   LDLCALC 120 (H) 06/13/2019   ALT 25 06/13/2019   AST 20 06/13/2019   NA 136 06/13/2019   K 4.3 06/13/2019   CL 100 06/13/2019   CREATININE 0.85 06/13/2019   BUN 25 (H) 06/13/2019   CO2 30 06/13/2019   TSH 1.85 06/13/2019   INR 0.88 01/16/2011   HGBA1C 6.5 06/13/2019   MICROALBUR 1.6 11/13/2014    Lab Results  Component Value Date   TSH 1.85 06/13/2019   Lab Results  Component Value Date   WBC 9.1 06/13/2019   HGB 12.7 06/13/2019   HCT 37.1 06/13/2019   MCV 85.6 06/13/2019   PLT 208.0 06/13/2019   Lab Results  Component Value Date   NA 136 06/13/2019   K 4.3 06/13/2019   CHLORIDE 101 07/08/2015   CO2 30 06/13/2019   GLUCOSE 116 (H) 06/13/2019   BUN 25 (H) 06/13/2019   CREATININE 0.85 06/13/2019   BILITOT 0.7 06/13/2019   ALKPHOS 61 06/13/2019   AST 20 06/13/2019   ALT 25 06/13/2019   PROT 7.0 06/13/2019   ALBUMIN 4.3 06/13/2019   CALCIUM 10.0 06/13/2019   ANIONGAP 9 12/26/2017   EGFR 75 (L) 07/08/2015   GFR 64.65 06/13/2019   Lab Results  Component Value Date   CHOL 209 (H) 06/13/2019   Lab Results  Component Value Date   HDL 61.90 06/13/2019   Lab Results  Component Value Date   LDLCALC 120 (H) 06/13/2019   Lab Results  Component Value Date   TRIG 136.0 06/13/2019   Lab Results  Component Value Date   CHOLHDL 3 06/13/2019   Lab Results  Component Value Date   HGBA1C 6.5 06/13/2019       Assessment & Plan:   Problem List Items Addressed This Visit    Vitamin D deficiency    Labs reveal deficiency. Start on Vitamin D 50000 IU caps, 1 cap po weekly x 12 weeks. Disp #4 with 4 rf. Also take daily Vitamin D over the counter. If  already taking a daily supplement increase by 1000 IU daily and if not start Vitamin D 2000 IU daily.       Relevant Orders   VITAMIN D 25 Hydroxy (Vit-D Deficiency, Fractures) (Completed)   Recurrent UTI    Check UA and culture      Dizzy spells    Encouraged to arise slowly and to always make sure to eat proteinin small frequent meals.       Stenosis of carotid artery    Repeat ultrasounds ordered      Relevant Medications  rosuvastatin (CRESTOR) 10 MG tablet   Renal insufficiency   Relevant Orders   VAS US RENAL ARTERY DUPLEX    Other Visit Diagnoses    Vitamin B12 deficiency    -  Primary   Relevant Orders   Vitamin B12 (Completed)      I have discontinued Alinna N. Rozeboom's glucose blood. I have also changed her rosuvastatin. Additionally, I am having her start on OneTouch Ultra. Lastly, I am having her maintain her cholecalciferol, ondansetron, aspirin, sodium chloride, potassium chloride, amLODipine, onetouch ultrasoft, hydrALAZINE, OneTouch UltraLink, NexIUM, Folbic, metoprolol tartrate, metoCLOPramide, losartan, metFORMIN, betamethasone valerate ointment, and dicyclomine.  Meds ordered this encounter  Medications  . glucose blood (ONETOUCH ULTRA) test strip    Sig: Check blood sugar 3 times a day or as needed.  Dx Code: E11.9    Dispense:  300 each    Refill:  1  . rosuvastatin (CRESTOR) 10 MG tablet    Sig: 10 mg po qhs except take 20 mg po qhs on Saturday and tuesday    Dispense:  120 tablet    Refill:  1    Requesting 1 year supply     Penni Homans, MD

## 2019-06-18 NOTE — Assessment & Plan Note (Signed)
Encouraged to arise slowly and to always make sure to eat proteinin small frequent meals.

## 2019-06-18 NOTE — Assessment & Plan Note (Signed)
Repeat ultrasounds ordered

## 2019-07-04 ENCOUNTER — Encounter (HOSPITAL_BASED_OUTPATIENT_CLINIC_OR_DEPARTMENT_OTHER): Payer: Medicare Other

## 2019-07-08 ENCOUNTER — Other Ambulatory Visit: Payer: Self-pay

## 2019-07-08 ENCOUNTER — Ambulatory Visit: Payer: Medicare Other | Admitting: Family Medicine

## 2019-07-08 ENCOUNTER — Encounter: Payer: Self-pay | Admitting: Family Medicine

## 2019-07-08 VITALS — BP 137/78 | HR 63 | Ht 61.0 in | Wt 168.0 lb

## 2019-07-08 DIAGNOSIS — M542 Cervicalgia: Secondary | ICD-10-CM

## 2019-07-08 NOTE — Progress Notes (Signed)
REEGHAN [name] - 79 y.o. female MRN [id number]  Date of birth: Jun 07, 1940  SUBJECTIVE:  Including CC & ROS.  Chief Complaint  Patient presents with  . Shoulder Pain    left x 1 week    [name] is a 79 y.o. female that is presenting with left-sided neck and shoulder pain.  The pain is a week old.  She denies any inciting event or trauma.  She does have a history of previous neck issues.  The pain seems originate from the trapezius with some radiation down to the shoulder and to the elbow.  No improvement with all modalities.  Pain is worse in the morning.  Independent review of the cervical spine x-ray from 2019 shows multiple level degenerative disc changes.   Review of Systems See HPI   HISTORY: Past Medical, Surgical, Social, and Family History Reviewed & Updated per EMR.   Pertinent Historical Findings include:  Past Medical History:  Diagnosis Date  . Abdominal pain in female 03/18/2010   Qualifier: Diagnosis of  By: Carlean Purl MD, Dimas Millin Anemia 06/08/2014  . Anxiety   . Arthritis    Spinal Osteoarthritis  . Cancer ([place])   . Carcinoid tumor of stomach   . Cataract   . Chest pain    Myoview 12/15 no ischemia.  . Chronic kidney disease    Left kidney smaller than right kidney  . Constipation 11/21/2016  . 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 Diabetic peripheral neuropathy ([place]) 10/29/2013  . Encounter for preventative adult health care exam with abnormal findings 09/14/2013  . Esophageal reflux   . Gastric polyp    Fundic Gland  . Gastroparesis   . Headache(784.0)   . Heart murmur    Echocardiogram 2/11: EF 60-65%, mild LAE, grade 1 diastolic dysfunction, aortic valve sclerosis, mean gradient 9 mm of mercury, PASP 34  . Hematuria 03/16/2016  . Iron deficiency anemia, unspecified   . 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 Leg swelling    bilateral  . Neck pain 04/22/2015  . PONV  (postoperative nausea and vomiting)    pt states only needs small amount of anesthesia  . PSVT (paroxysmal supraventricular tachycardia) ([place])   . Pure hypercholesterolemia   . 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 Renal insufficiency 06/18/2019  . Stroke ([place])    tia, 2014  . 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 Type II or unspecified type diabetes mellitus without mention of complication, not stated as uncontrolled   . Unspecified essential hypertension   . Unspecified hereditary and idiopathic peripheral neuropathy 10/29/2013    Past Surgical History:  Procedure Laterality Date  . CHOLECYSTECTOMY  1993  . COLONOSCOPY  11/11/2010   diverticulosis  . DILATATION & CURRETTAGE/HYSTEROSCOPY WITH RESECTOCOPE N/A 02/25/2013   Procedure: Attempted hysteroscopy with uterine perforation;  Surgeon: Jamey Reas de Berton Lan, MD;  Location: [facility];  Service: Gynecology;  Laterality: N/A;  . ESOPHAGOGASTRODUODENOSCOPY  08/29/2010; 09/15/2010   Carcinoid tumor less than 1 cm in July 2012 not seen in August 2012 , gastritis, fundic gland polyps  . ESOPHAGOGASTRODUODENOSCOPY  05/16/2011  . ESOPHAGOGASTRODUODENOSCOPY  06/14/2012  . EUS  12/15/2010   Procedure: UPPER ENDOSCOPIC ULTRASOUND (EUS) LINEAR;  Surgeon: Owens Loffler, MD;  Location: [facility];  Service: Endoscopy;  Laterality: N/A;  . EYE SURGERY Bilateral    Bi lateral cateracts and bi lateral laser  .  LAPAROSCOPY N/A 02/25/2013   Procedure: Cystoscopy and laparoscopy with fulguration of uterine serosa;  Surgeon: Jamey Reas de Berton Lan, MD;  Location: [facility];  Service: Gynecology;  Laterality: N/A;  . TONSILLECTOMY      Family History  Problem Relation Age of Onset  . Diabetes Mother   . Stroke Father        deceased age 42  . Heart disease Sister        deceased MI age 47  . Heart disease Brother        deceased MI age 25  . Diabetes Maternal Grandmother   . Hypertension Paternal Grandmother   . Diabetes Sister   . Heart disease  Sister   . Hypertension Sister   . Hyperlipidemia Sister   . Diabetes Sister   . Heart disease Sister   . Hypertension Sister   . Hyperlipidemia Sister   . Diabetes Brother   . Heart disease Brother   . Hypertension Brother   . Hyperlipidemia Brother   . Colon cancer Neg Hx   . Esophageal cancer Neg Hx   . Stomach cancer Neg Hx   . Rectal cancer Neg Hx     Social History   Socioeconomic History  . Marital status: Widowed    Spouse name: Not on file  . Number of children: 0  . Years of education: college  . Highest education level: Not on file  Occupational History  . Occupation: retired  Tobacco Use  . Smoking status: Never Smoker  . Smokeless tobacco: Never Used  . Tobacco comment: Never used tobacco  Substance and Sexual Activity  . Alcohol use: No    Alcohol/week: 0.0 standard drinks  . Drug use: No  . Sexual activity: Never    Partners: Male    Birth control/protection: Post-menopausal    Comment: lives alone, no dietary restrictions except avoid fresh veg, fruit, whole grains  Other Topics Concern  . Not on file  Social History Narrative   Patient was married ([name]) - widow   Patient does not have any children.   Patient is right-handed.   Patient has a BA degree.   One caffeine drink daily    Social Determinants of Health   Financial Resource Strain:   . Difficulty of Paying Living Expenses:   Food Insecurity:   . Worried About Charity fundraiser in the Last Year:   . Arboriculturist in the Last Year:   Transportation Needs:   . Film/video editor (Medical):   Marland Kitchen Lack of Transportation (Non-Medical):   Physical Activity:   . Days of Exercise per Week:   . Minutes of Exercise per Session:   Stress:   . Feeling of Stress :   Social Connections:   . Frequency of Communication with Friends and Family:   . Frequency of Social Gatherings with Friends and Family:   . Attends Religious Services:   . Active Member of Clubs or Organizations:   .  Attends Archivist Meetings:   Marland Kitchen Marital Status:   Intimate Partner Violence:   . Fear of Current or Ex-Partner:   . Emotionally Abused:   Marland Kitchen Physically Abused:   . Sexually Abused:      PHYSICAL EXAM:  VS: BP 137/78   Pulse 63   Ht 5\' 1"  (1.549 m)   Wt 168 lb (76.2 kg)   LMP 02/07/1992   BMI 31.74 kg/m  Physical Exam Gen: NAD, alert, cooperative  with exam, well-appearing MSK:  Neck: Sitting in a forward flexed position. Tenderness to palpation over the paraspinal cervical muscles. No tenderness palpation of the midline cervical spine. Normal strength resistance with shrug. Trigger points appreciated the trapezius. Left shoulder: Normal internal and external rotation. Normal external rotation. Normal external rotation and abduction. Normal empty can test. Normal speeds test. Neurovascularly intact     ASSESSMENT & PLAN:   Neck pain Pain seems to be more spasm related.  She may have a radicular component that she is feeling in the left shoulder.  Seems less likely for an impingement or shoulder source with her exam being reassuring.  Does have trigger points appreciated in the paraspinal cervical muscles and trapezius. -Counseled home exercise therapy and supportive care -Provided Pennsaid samples. -If no improvement can consider subacromial injection to differentiate from shoulder or neck.  Consider physical therapy.

## 2019-07-08 NOTE — Assessment & Plan Note (Signed)
Pain seems to be more spasm related.  She may have a radicular component that she is feeling in the left shoulder.  Seems less likely for an impingement or shoulder source with her exam being reassuring.  Does have trigger points appreciated in the paraspinal cervical muscles and trapezius. -Counseled home exercise therapy and supportive care -Provided Pennsaid samples. -If no improvement can consider subacromial injection to differentiate from shoulder or neck.  Consider physical therapy.

## 2019-07-08 NOTE — Patient Instructions (Signed)
Nice to meet you  Please try heat  Please try the exercises  Please try the pennsaid. Don't rub it in, just apply to the skin Please send me a message in MyChart with any questions or updates.  Please see me back in 4 weeks or sooner if needed.   --Dr. Raeford Razor

## 2019-07-08 NOTE — Progress Notes (Signed)
Medication Samples have been provided to the patient.  Drug name: Pennsaid       Strength: 2%        Qty: 2 Boxes LOT: [id number]  Exp.Date: 02/2020  Dosing instructions: Use a pea size amount and rub gently.  The patient has been instructed regarding the correct time, dose, and frequency of taking this medication, including desired effects and most common side effects.   Sherrie George, MA 3:12 PM 07/08/2019

## 2019-07-11 ENCOUNTER — Telehealth: Payer: Self-pay

## 2019-07-11 NOTE — Telephone Encounter (Signed)
Patient called in to speak with the nurse about some medication issues that she is having. Please call the patient at [phone]

## 2019-07-11 NOTE — Telephone Encounter (Signed)
Patient states that her insurance will not cover Crestor because of the way it is written.  She would like to see if this can be fixed.  Rx is written to take 10mg  daily except take 20mg  two days a week.

## 2019-07-13 NOTE — Telephone Encounter (Signed)
Please check with pharmacy and see what it is her insurance wants [place] to do with the prescription

## 2019-07-14 ENCOUNTER — Telehealth: Payer: Self-pay

## 2019-07-14 NOTE — Telephone Encounter (Signed)
PA initiated via Covermymeds; KEY: [id number]. Awaiting determination.

## 2019-07-14 NOTE — Telephone Encounter (Signed)
PA approved.  Request Reference Number: [id number]. CRESTOR TAB 10MG  is approved through 02/06/2020. Your patient may now fill this prescription and it will be covered.

## 2019-07-15 NOTE — Telephone Encounter (Signed)
Patient would like to know if she could stay on 10mg  daily and work on her diet and exercise.  She does not want her bones to hurt.

## 2019-07-15 NOTE — Telephone Encounter (Signed)
Ok then change her to 20 mg tabs, 1 tab po daily, disp #30 with 3 rf

## 2019-07-15 NOTE — Telephone Encounter (Signed)
Yes she can stay on the 10 for now and repeat labs in 3 months and then an appt after that to discuss options

## 2019-07-15 NOTE — Telephone Encounter (Signed)
Patient insurance will not pay for more than 1 tablet per day.

## 2019-07-16 MED ORDER — ROSUVASTATIN CALCIUM 10 MG PO TABS: 10.0000 mg | ORAL_TABLET | Freq: Every day | ORAL | 1 refills | Status: DC

## 2019-07-16 NOTE — Telephone Encounter (Signed)
Patient notified and rx sent. In.

## 2019-07-18 ENCOUNTER — Encounter (HOSPITAL_BASED_OUTPATIENT_CLINIC_OR_DEPARTMENT_OTHER): Payer: Medicare Other

## 2019-07-24 ENCOUNTER — Other Ambulatory Visit: Payer: Self-pay | Admitting: Family Medicine

## 2019-07-24 ENCOUNTER — Other Ambulatory Visit: Payer: Self-pay | Admitting: Cardiology

## 2019-08-05 ENCOUNTER — Ambulatory Visit: Payer: Medicare Other | Admitting: Family Medicine

## 2019-08-07 ENCOUNTER — Telehealth: Payer: Self-pay | Admitting: Family Medicine

## 2019-08-07 ENCOUNTER — Telehealth: Payer: Self-pay | Admitting: Internal Medicine

## 2019-08-07 NOTE — Telephone Encounter (Signed)
Patient will schedule at visit on 09/09/19.  Scheduling before will be too early.

## 2019-08-07 NOTE — Telephone Encounter (Signed)
Caller: Lynesha Call back phone number: [phone]  Patient would like to know when would she get blood work done   Please advise

## 2019-08-08 ENCOUNTER — Telehealth: Payer: Self-pay | Admitting: Medical

## 2019-08-08 ENCOUNTER — Ambulatory Visit (INDEPENDENT_AMBULATORY_CARE_PROVIDER_SITE_OTHER): Payer: Medicare Other | Admitting: Medical

## 2019-08-08 ENCOUNTER — Other Ambulatory Visit: Payer: Self-pay

## 2019-08-08 VITALS — BP 129/56 | HR 61 | Resp 18 | Ht 61.0 in | Wt 170.0 lb

## 2019-08-08 DIAGNOSIS — R1013 Epigastric pain: Secondary | ICD-10-CM | POA: Diagnosis not present

## 2019-08-08 DIAGNOSIS — R5383 Other fatigue: Secondary | ICD-10-CM | POA: Diagnosis not present

## 2019-08-08 DIAGNOSIS — K146 Glossodynia: Secondary | ICD-10-CM | POA: Diagnosis not present

## 2019-08-08 LAB — COMPREHENSIVE METABOLIC PANEL
ALT: 18 U/L (ref 0–35)
AST: 17 U/L (ref 0–37)
Albumin: 4.5 g/dL (ref 3.5–5.2)
Alkaline Phosphatase: 58 U/L (ref 39–117)
BUN: 18 mg/dL (ref 6–23)
CO2: 29 mEq/L (ref 19–32)
Calcium: 9.9 mg/dL (ref 8.4–10.5)
Chloride: 96 mEq/L (ref 96–112)
Creatinine, Ser: 0.77 mg/dL (ref 0.40–1.20)
GFR: 72.43 mL/min (ref >60.00)
Glucose, Bld: 95 mg/dL (ref 70–99)
Potassium: 4.5 mEq/L (ref 3.5–5.1)
Sodium: 132 mEq/L — ABNORMAL LOW (ref 135–145)
Total Bilirubin: 0.6 mg/dL (ref 0.2–1.2)
Total Protein: 7.1 g/dL (ref 6.0–8.3)

## 2019-08-08 LAB — CBC WITH DIFFERENTIAL/PLATELET
Basophils Absolute: 0.1 10*3/uL (ref 0.0–0.1)
Basophils Relative: 0.7 % (ref 0.0–3.0)
Eosinophils Absolute: 0.1 10*3/uL (ref 0.0–0.7)
Eosinophils Relative: 0.9 % (ref 0.0–5.0)
HCT: 37.1 % (ref 36.0–46.0)
Hemoglobin: 12.4 g/dL (ref 12.0–15.0)
Lymphocytes Relative: 28.7 % (ref 12.0–46.0)
Lymphs Abs: 2.4 10*3/uL (ref 0.7–4.0)
MCHC: 33.5 g/dL (ref 30.0–36.0)
MCV: 85.9 fl (ref 78.0–100.0)
Monocytes Absolute: 0.8 10*3/uL (ref 0.1–1.0)
Monocytes Relative: 10 % (ref 3.0–12.0)
Neutro Abs: 5 10*3/uL (ref 1.4–7.7)
Neutrophils Relative %: 59.7 % (ref 43.0–77.0)
Platelets: 205 10*3/uL (ref 150.0–400.0)
RBC: 4.32 Mil/uL (ref 3.87–5.11)
RDW: 12.8 % (ref 11.5–15.5)
WBC: 8.3 10*3/uL (ref 4.0–10.5)

## 2019-08-08 LAB — T4, FREE: Free T4: 0.97 ng/dL (ref 0.60–1.60)

## 2019-08-08 LAB — VITAMIN D 25 HYDROXY (VIT D DEFICIENCY, FRACTURES): VITD: 54.2 ng/mL (ref 30.00–100.00)

## 2019-08-08 LAB — IRON: Iron: 69 ug/dL (ref 42–145)

## 2019-08-08 LAB — VITAMIN B12: Vitamin B-12: 339 pg/mL (ref 211–911)

## 2019-08-08 LAB — LIPASE: Lipase: 30 U/L (ref 11.0–59.0)

## 2019-08-08 LAB — TSH: TSH: 1.44 u[IU]/mL (ref 0.35–4.50)

## 2019-08-08 MED ORDER — MAGIC MOUTHWASH W/LIDOCAINE: ORAL | 0 refills | Status: DC

## 2019-08-08 MED ORDER — FLUCONAZOLE 150 MG PO TABS: 150.0000 mg | ORAL_TABLET | Freq: Once | ORAL | 0 refills | Status: AC

## 2019-08-08 MED ORDER — FAMOTIDINE 20 MG PO TABS: 20.0000 mg | ORAL_TABLET | Freq: Every day | ORAL | 0 refills | Status: DC

## 2019-08-08 MED FILL — MAGIC MW BOP W/LIDO 1:1: 10 days supply | Qty: 200 | Fill #0

## 2019-08-08 NOTE — Progress Notes (Signed)
Subjective:    [name], female    DOB: January 27, 1941, 79 y.o.   MRN: [id number]  HPI  Pt in for evaluation of fatigue, sensation of burning mouth and recent epigastric pain. In past on review pt does have   Pt describes that she has been fatigue for long time. For months. Recently worse in morning. Hx of low iron in past. Also hx of b12 being low.   Pt does have hx of acid reflux. Also has gastroparesis. Pt is still on reglan. Uses it if needed. Also she is nexium. Pt occasionally uses tums.  Pain moderate. No vomiting. No blood in stools. No black stools.   Multiple gastric polyps. Consistent with known fundic gland polyps. Nothing suspicious seen today. - The examination was otherwise normal. - No specimens collected.  In the past I saw pt for burning mouth symptoms and referred to ENT. Pt does remember seeing ent but can't remember if they agreed. Burning mouth did resolve in past. Recently reoccuring.    Review of Systems  Constitutional: Positive for fatigue. Negative for chills and fever.  HENT:       Burning mouth. See hpi.   Respiratory: Negative for cough, chest tightness, shortness of breath and wheezing.   Cardiovascular: Negative for chest pain and palpitations.  Gastrointestinal: Positive for abdominal pain. Negative for constipation and diarrhea.  Genitourinary: Negative for dysuria.  Musculoskeletal: Negative for back pain and gait problem.  Neurological: Negative for dizziness, seizures, speech difficulty, weakness, light-headedness and headaches.  Hematological: Negative for adenopathy. Does not bruise/bleed easily.  Psychiatric/Behavioral: Negative for behavioral problems and confusion.   Past Medical History:  Diagnosis Date   Abdominal pain in female 03/18/2010   Qualifier: Diagnosis of  By: Carlean Purl MD, Dimas Millin    Anemia 06/08/2014   Anxiety    Arthritis    Spinal Osteoarthritis   Cancer ([place])    Carcinoid tumor of  stomach    Cataract    Chest pain    Myoview 12/15 no ischemia.   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 ([place]) 09/05/2006   Qualifier: Diagnosis of  By: Marca Ancona RMA, Lucy     Diabetic peripheral neuropathy ([place]) 10/29/2013   Encounter for preventative adult health care exam with abnormal findings 09/14/2013   Esophageal reflux    Gastric polyp    Fundic Gland   Gastroparesis    Headache(784.0)    Heart murmur    Echocardiogram 2/11: EF 60-65%, mild LAE, grade 1 diastolic dysfunction, aortic valve sclerosis, mean gradient 9 mm of mercury, PASP 34   Hematuria 03/16/2016   Iron deficiency anemia, unspecified    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   Leg swelling    bilateral   Neck pain 04/22/2015   PONV (postoperative nausea and vomiting)    pt states only needs small amount of anesthesia   PSVT (paroxysmal supraventricular tachycardia) ([place])    Pure hypercholesterolemia    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   Renal insufficiency 06/18/2019   Stroke ([place])    tia, 2014   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   Type II or unspecified type diabetes mellitus without mention of complication, not stated as uncontrolled    Unspecified essential hypertension    Unspecified hereditary and idiopathic peripheral neuropathy 10/29/2013     Social History   Socioeconomic History   Marital status: Widowed    Spouse name:  Not on file   Number of children: 0   Years of education: college   Highest education level: Not on file  Occupational History   Occupation: retired  Tobacco Use   Smoking status: Never Smoker   Smokeless tobacco: Never Used   Tobacco comment: Never used tobacco  Vaping Use   Vaping Use: Never used  Substance and Sexual Activity   Alcohol use: No    Alcohol/week: 0.0 standard drinks   Drug use: No   Sexual activity: Never    Partners: Male    Birth control/protection:  Post-menopausal    Comment: lives alone, no dietary restrictions except avoid fresh veg, fruit, whole grains  Other Topics Concern   Not on file  Social History Narrative   Patient was married ([name]) - widow   Patient does not have any children.   Patient is right-handed.   Patient has a BA degree.   One caffeine drink daily    Social Determinants of Health   Financial Resource Strain:    Difficulty of Paying Living Expenses:   Food Insecurity:    Worried About Charity fundraiser in the Last Year:    Arboriculturist in the Last Year:   Transportation Needs:    Film/video editor (Medical):    Lack of Transportation (Non-Medical):   Physical Activity:    Days of Exercise per Week:    Minutes of Exercise per Session:   Stress:    Feeling of Stress :   Social Connections:    Frequency of Communication with Friends and Family:    Frequency of Social Gatherings with Friends and Family:    Attends Religious Services:    Active Member of Clubs or Organizations:    Attends Archivist Meetings:    Marital Status:   Intimate Partner Violence:    Fear of Current or Ex-Partner:    Emotionally Abused:    Physically Abused:    Sexually Abused:     Past Surgical History:  Procedure Laterality Date   CHOLECYSTECTOMY  1993   COLONOSCOPY  11/11/2010   diverticulosis   DILATATION & CURRETTAGE/HYSTEROSCOPY WITH RESECTOCOPE N/A 02/25/2013   Procedure: Attempted hysteroscopy with uterine perforation;  Surgeon: Jamey Reas de Berton Lan, MD;  Location: [facility];  Service: Gynecology;  Laterality: N/A;   ESOPHAGOGASTRODUODENOSCOPY  08/29/2010; 09/15/2010   Carcinoid tumor less than 1 cm in July 2012 not seen in August 2012 , gastritis, fundic gland polyps   ESOPHAGOGASTRODUODENOSCOPY  05/16/2011   ESOPHAGOGASTRODUODENOSCOPY  06/14/2012   EUS  12/15/2010   Procedure: UPPER ENDOSCOPIC ULTRASOUND (EUS) LINEAR;  Surgeon: Owens Loffler, MD;  Location:  [facility];  Service: Endoscopy;  Laterality: N/A;   EYE SURGERY Bilateral    Bi lateral cateracts and bi lateral laser   LAPAROSCOPY N/A 02/25/2013   Procedure: Cystoscopy and laparoscopy with fulguration of uterine serosa;  Surgeon: Jamey Reas de Berton Lan, MD;  Location: [facility];  Service: Gynecology;  Laterality: N/A;   TONSILLECTOMY      Family History  Problem Relation Age of Onset   Diabetes Mother    Stroke Father        deceased age 58   Heart disease Sister        deceased MI age 76   Heart disease Brother        deceased MI age 72   Diabetes Maternal Grandmother    Hypertension Paternal Grandmother    Diabetes  Sister    Heart disease Sister    Hypertension Sister    Hyperlipidemia Sister    Diabetes Sister    Heart disease Sister    Hypertension Sister    Hyperlipidemia Sister    Diabetes Brother    Heart disease Brother    Hypertension Brother    Hyperlipidemia Brother    Colon cancer Neg Hx    Esophageal cancer Neg Hx    Stomach cancer Neg Hx    Rectal cancer Neg Hx     Allergies  Allergen Reactions   Tramadol Other (See Comments)    Dizziness     Current Outpatient Medications on File Prior to Visit  Medication Sig Dispense Refill   amLODipine (NORVASC) 5 MG tablet TAKE 1 TABLET BY MOUTH  DAILY 90 tablet 3   aspirin 81 MG tablet Take 81 mg by mouth daily.     Blood Glucose Monitoring Suppl (ONETOUCH ULTRALINK) w/Device KIT Use to check blood sugars BID E11.69 1 kit 0   cholecalciferol (VITAMIN D) 1000 UNITS tablet Take 1,000 Units by mouth daily.       dicyclomine (BENTYL) 10 MG capsule TAKE ONE CAPSULE BY MOUTH 4 TIMES DAILY AS NEEDED 161 capsule 1   folic acid-pyridoxine-cyancobalamin (FOLBIC) 2.5-25-2 MG TABS tablet Take 1 tab po M,W,F 90 tablet 1   glucose blood (ONETOUCH ULTRA) test strip Check blood sugar 3 times a day or as needed.  Dx Code: E11.9 300 each 1   hydrALAZINE (APRESOLINE) 10 MG tablet  Take 1 tablet (10 mg total) by mouth 2 (two) times daily. (Patient taking differently: Take 10 mg by mouth 2 (two) times daily as needed. ) 60 tablet 3   Lancets (ONETOUCH ULTRASOFT) lancets Use as instructedDispense based on patient and insurance preference.Check BS TID and PRN Dx:E119 100 each 12   losartan (COZAAR) 50 MG tablet TAKE 1 TABLET BY MOUTH  TWICE DAILY 180 tablet 3   metFORMIN (GLUCOPHAGE-XR) 500 MG 24 hr tablet TAKE 1 TABLET BY MOUTH 3  TIMES DAILY WITH MEALS 270 tablet 3   metoCLOPramide (REGLAN) 5 MG tablet TAKE 1 TABLET BY MOUTH  TWICE DAILY 180 tablet 0   metoprolol tartrate (LOPRESSOR) 50 MG tablet TAKE 1 AND 1/2 TABLETS BY  MOUTH TWICE DAILY 270 tablet 3   NEXIUM 40 MG capsule Take 1 capsule (40 mg total) by mouth daily. 90 capsule 3   ondansetron (ZOFRAN) 4 MG tablet Take 4 mg by mouth every 4 (four) hours as needed. For nausea       potassium chloride (KLOR-CON) 10 MEQ tablet TAKE 1 TABLET BY MOUTH  DAILY 90 tablet 3   rosuvastatin (CRESTOR) 10 MG tablet Take 1 tablet (10 mg total) by mouth daily. 90 tablet 1   sodium chloride (OCEAN) 0.65 % SOLN nasal spray Place 1 spray into both nostrils as needed for congestion. 15 mL 0   betamethasone valerate ointment (VALISONE) 0.1 % Apply qod to affected area, small amount (Patient not taking: Reported on 08/08/2019) 45 g 1   No current facility-administered medications on file prior to visit.    BP (!) 129/56 (BP Location: Left Arm, Patient Position: Sitting, Cuff Size: Large)    Pulse 61    Resp 18    Ht 5' 1" (1.549 m)    Wt 170 lb (77.1 kg)    LMP 02/07/1992    SpO2 98%    BMI 32.12 kg/m       Objective:   Physical Exam  General Mental Status- Alert. General Appearance- Not in acute distress.   Skin General: Color- Normal Color. Moisture- Normal Moisture.  Neck Carotid Arteries- Normal color. Moisture- Normal Moisture. No carotid bruits. No JVD.  Chest and Lung Exam Auscultation: Breath  Sounds:-Normal.  Cardiovascular Auscultation:Rythm- Regular. Murmurs & Other Heart Sounds:Auscultation of the heart reveals- No Murmurs.  Abdomen Inspection:-Inspeection Normal. Palpation/Percussion:Note:No mass. Palpation and Percussion of the abdomen reveal- faint epigastric Tender, Non Distended + BS, no rebound or guarding.    Neurologic Cranial Nerve exam:- CN III-XII intact(No nystagmus), symmetric smile. Strength:- 5/5 equal and symmetric strength both upper and lower extremities.  heent- no sinus pressure. Mouth- normal tongue. No lesions      Assessment & Plan:  For history of fatigue will get attached listed labs. If iron low will refer you back to Dr. Marin Olp since could not tolerate iron by mouth.  For burning mouth will get labs as well. Rx magic mouthwash pending study review.  For hx of epigstric pain continue nexium in morning and famotdine at night.  Please follow up with Dr. Carlean Purl. If your pain is not improving let me know. I can try to get you in sooner.  Follow up in  7-10 days or as needed  [name], PA-C   Time spent with patient today was  35  minutes which consisted of chart review, discussing diagnosis, work up, treatment and documentation.

## 2019-08-08 NOTE — Telephone Encounter (Signed)
Attempted to return call.  No answer/vm

## 2019-08-08 NOTE — Telephone Encounter (Signed)
Rx diflucan sent to pt pharmacy.

## 2019-08-08 NOTE — Patient Instructions (Addendum)
For history of fatigue will get attached listed labs. If iron low will refer you back to Dr. Marin Olp since could not tolerate iron by mouth.  For burning mouth will get labs as well. Rx magic mouthwash pending study review.  For hx of epigstric pain continue nexium in morning and famotdine at night.  Please follow up with Dr. Carlean Purl. If your pain is not improving let me know. I can try to get you in sooner.  Follow up in  7-10 days or as needed

## 2019-08-12 LAB — VITAMIN B1: Vitamin B1 (Thiamine): 13 nmol/L (ref 8–30)

## 2019-08-12 NOTE — Telephone Encounter (Signed)
Left message for patient to call back  

## 2019-08-13 ENCOUNTER — Telehealth: Payer: Self-pay | Admitting: Family Medicine

## 2019-08-13 NOTE — Telephone Encounter (Signed)
Caller: Oletha Call back phone number: [phone]  Would like to get lab results

## 2019-08-13 NOTE — Telephone Encounter (Signed)
[name], [name]  08/13/2019  8:27 AM EDT     Patient viewed labs via mychart 08/12/19   [name], PA-C  08/08/2019  8:39 PM EDT     So far all of your test came back showing mild low sodium. I don't think that is significant. Iron levels are normal. Other lab studies normal. I think it is worthwhile to try difllcan one tab for one dose as we discussed your history of thrush

## 2019-08-13 NOTE — Telephone Encounter (Signed)
No return call from the patient.  I will await a return call

## 2019-08-14 ENCOUNTER — Telehealth: Payer: Self-pay | Admitting: *Deleted

## 2019-08-14 NOTE — Telephone Encounter (Signed)
Patient given results below, she would like to think about the diflucan tablet.  If she decides to take it she will call us back.   So far all of your test came back showing mild low sodium. I don't think that is significant. Iron levels are normal. Other lab studies normal. I think it is worthwhile to try difllcan one tab for one dose as we discussed your history of thrush.  Diflucan 150mg  #1 take 1 tab as 1 dose.

## 2019-08-18 ENCOUNTER — Ambulatory Visit: Payer: Medicare Other | Admitting: Family

## 2019-08-18 ENCOUNTER — Other Ambulatory Visit: Payer: Medicare Other

## 2019-08-22 ENCOUNTER — Encounter: Payer: Self-pay | Admitting: *Deleted

## 2019-08-27 ENCOUNTER — Other Ambulatory Visit: Payer: Self-pay

## 2019-08-27 MED ORDER — DICYCLOMINE HCL 10 MG PO CAPS: ORAL_CAPSULE | ORAL | 1 refills | Status: DC

## 2019-08-27 NOTE — Telephone Encounter (Signed)
Dicyclomine refilled as requested. 

## 2019-09-02 ENCOUNTER — Other Ambulatory Visit: Payer: Self-pay | Admitting: *Deleted

## 2019-09-02 MED ORDER — ONETOUCH DELICA PLUS LANCET33G MISC: 1 refills | Status: DC

## 2019-09-03 ENCOUNTER — Other Ambulatory Visit: Payer: Self-pay | Admitting: Family Medicine

## 2019-09-03 NOTE — Telephone Encounter (Signed)
New message:   1.Medication Requested: Lancets (ONETOUCH DELICA PLUS [id number]) [place] 2. Pharmacy (Name, Street, [place]): [place], [place] [employer], [address] 3. On Med List: yes  4. Last Visit with PCP:   5. Next visit date with PCP:   Agent: Please be advised that RX refills may take up to 3 business days. We ask that you follow-up with your pharmacy.

## 2019-09-03 NOTE — Telephone Encounter (Signed)
Refills sent yesterday, 09/02/2019.

## 2019-09-09 ENCOUNTER — Ambulatory Visit: Payer: Medicare Other | Admitting: Family Medicine

## 2019-09-09 ENCOUNTER — Telehealth: Payer: Self-pay | Admitting: *Deleted

## 2019-09-09 ENCOUNTER — Other Ambulatory Visit: Payer: Self-pay

## 2019-09-09 ENCOUNTER — Telehealth (INDEPENDENT_AMBULATORY_CARE_PROVIDER_SITE_OTHER): Payer: Medicare Other | Admitting: Family Medicine

## 2019-09-09 DIAGNOSIS — E669 Obesity, unspecified: Secondary | ICD-10-CM

## 2019-09-09 DIAGNOSIS — I10 Essential (primary) hypertension: Secondary | ICD-10-CM

## 2019-09-09 DIAGNOSIS — D538 Other specified nutritional anemias: Secondary | ICD-10-CM

## 2019-09-09 DIAGNOSIS — E538 Deficiency of other specified B group vitamins: Secondary | ICD-10-CM

## 2019-09-09 DIAGNOSIS — E871 Hypo-osmolality and hyponatremia: Secondary | ICD-10-CM

## 2019-09-09 DIAGNOSIS — R5383 Other fatigue: Secondary | ICD-10-CM

## 2019-09-09 DIAGNOSIS — K219 Gastro-esophageal reflux disease without esophagitis: Secondary | ICD-10-CM

## 2019-09-09 DIAGNOSIS — N289 Disorder of kidney and ureter, unspecified: Secondary | ICD-10-CM | POA: Diagnosis not present

## 2019-09-09 DIAGNOSIS — E559 Vitamin D deficiency, unspecified: Secondary | ICD-10-CM | POA: Diagnosis not present

## 2019-09-09 DIAGNOSIS — E782 Mixed hyperlipidemia: Secondary | ICD-10-CM

## 2019-09-09 DIAGNOSIS — E1169 Type 2 diabetes mellitus with other specified complication: Secondary | ICD-10-CM | POA: Diagnosis not present

## 2019-09-09 DIAGNOSIS — R42 Dizziness and giddiness: Secondary | ICD-10-CM

## 2019-09-09 DIAGNOSIS — B370 Candidal stomatitis: Secondary | ICD-10-CM

## 2019-09-09 MED ORDER — FAMOTIDINE 20 MG PO TABS: 20.0000 mg | ORAL_TABLET | Freq: Every evening | ORAL | 0 refills | Status: DC | PRN

## 2019-09-09 NOTE — Assessment & Plan Note (Signed)
Avoid offending foods, start probiotics. Do not eat large meals in late evening and consider raising head of bed. She is feeling much better, is on the Nexium in the morning and is not taking the Famotidine and still feels well. Use Famotidine prn if symptoms return. Continue Nexium daily. She will follow up with gastroenterology if symptoms return.

## 2019-09-09 NOTE — Assessment & Plan Note (Signed)
Monitor and report any concerns, no changes to meds. Encouraged heart healthy diet such as the DASH diet and exercise as tolerated.  ?

## 2019-09-09 NOTE — Assessment & Plan Note (Signed)
hgba1c acceptable, minimize simple carbs. Increase exercise as tolerated. Continue current meds 

## 2019-09-09 NOTE — Telephone Encounter (Signed)
Patient would like to come in for labs before visit.  Is anything other than A1C, lipid, or CMP that you would like to add?

## 2019-09-09 NOTE — Assessment & Plan Note (Signed)
Increase leafy greens, consider increased lean red meat and using cast iron cookware. Continue to monitor, report any concerns, resolved on last check

## 2019-09-09 NOTE — Telephone Encounter (Signed)
Future orders placed 

## 2019-09-09 NOTE — Telephone Encounter (Signed)
Yes check those plus Vitamins B1, B6, B12, D, TSH.

## 2019-09-09 NOTE — Assessment & Plan Note (Signed)
Supplement and monitor 

## 2019-09-09 NOTE — Telephone Encounter (Signed)
Also cbc

## 2019-09-09 NOTE — Assessment & Plan Note (Signed)
Hydrate and monitor 

## 2019-09-09 NOTE — Assessment & Plan Note (Signed)
Still having episodes of feeling light headed and tired. Encouraged to hydrate well and eat Protein every 4-5 hours. Report worsening symptoms

## 2019-09-09 NOTE — Assessment & Plan Note (Signed)
Mild, asymptomatic. Drop water intake by one beverage a day from 7 glasses a day to 6 glasses.

## 2019-09-09 NOTE — Assessment & Plan Note (Signed)
Resolved with Magic Mouthwash she can restart Famotidine and Magic Mouthwash, she is encouraged to minimize carbohydrate intake

## 2019-09-09 NOTE — Progress Notes (Signed)
Virtual Visit via Phone Note  I connected with [name] on 09/09/19 at 11:00 AM EDT by a phone enabled telemedicine application and verified that I am speaking with the correct person using two identifiers.  Location: Patient: home, patient and provider are in visit Provider: home   I discussed the limitations of evaluation and management by telemedicine and the availability of in person appointments. The patient expressed understanding and agreed to proceed. Kem Boroughs, CMA was able to get the patient set up on a phone visit after being unable to set up a video visit   Subjective:    [name], female    DOB: [birth year], 79 y.o.   MRN: [id number]  Chief Complaint  Patient presents with  . Follow-up    HPI Patient is in today for follow up on chronic medical concerns. No recent febrile illness or hospitalizations. She is still struggling with fatigue and intermittent episodes of feeling light headed at times. No syncope or falls. Her reflux and epigastric pain are better. Her thrush has improved after Magic Mouthwash. Denies CP/palp/SOB/HA/congestion/fevers or GU c/o. Taking meds as prescribed  Past Medical History:  Diagnosis Date  . Abdominal pain in female 03/18/2010   Qualifier: Diagnosis of  By: Carlean Purl MD, Dimas Millin Anemia 06/08/2014  . Anxiety   . Arthritis    Spinal Osteoarthritis  . Cancer ([place])   . Carcinoid tumor of stomach   . Cataract   . Chest pain    Myoview 12/15 no ischemia.  . Chronic kidney disease    Left kidney smaller than right kidney  . Constipation 11/21/2016  . Diabetes mellitus type 2 in obese ([place]) 09/05/2006   Qualifier: Diagnosis of  By: Marca Ancona RMA, Lucy    . Diabetic peripheral neuropathy ([place]) 10/29/2013  . Encounter for preventative adult health care exam with abnormal findings 09/14/2013  . Esophageal reflux   . Gastric polyp    Fundic Gland  . Gastroparesis   . Headache(784.0)   . Heart murmur      Echocardiogram 2/11: EF 60-65%, mild LAE, grade 1 diastolic dysfunction, aortic valve sclerosis, mean gradient 9 mm of mercury, PASP 34  . Hematuria 03/16/2016  . Iron deficiency anemia, unspecified   . 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 Leg swelling    bilateral  . Neck pain 04/22/2015  . PONV (postoperative nausea and vomiting)    pt states only needs small amount of anesthesia  . PSVT (paroxysmal supraventricular tachycardia) ([place])   . Pure hypercholesterolemia   . 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 Renal insufficiency 06/18/2019  . Stroke ([place])    tia, 2014  . 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 Type II or unspecified type diabetes mellitus without mention of complication, not stated as uncontrolled   . Unspecified essential hypertension   . Unspecified hereditary and idiopathic peripheral neuropathy 10/29/2013    Past Surgical History:  Procedure Laterality Date  . CHOLECYSTECTOMY  1993  . COLONOSCOPY  11/11/2010   diverticulosis  . DILATATION & CURRETTAGE/HYSTEROSCOPY WITH RESECTOCOPE N/A 02/25/2013   Procedure: Attempted hysteroscopy with uterine perforation;  Surgeon: Jamey Reas de Berton Lan, MD;  Location: [facility];  Service: Gynecology;  Laterality: N/A;  . ESOPHAGOGASTRODUODENOSCOPY  08/29/2010; 09/15/2010   Carcinoid tumor less than 1 cm in July 2012 not seen in August 2012 , gastritis, fundic gland polyps  . ESOPHAGOGASTRODUODENOSCOPY  05/16/2011  . ESOPHAGOGASTRODUODENOSCOPY  06/14/2012  . EUS  12/15/2010   Procedure:  UPPER ENDOSCOPIC ULTRASOUND (EUS) LINEAR;  Surgeon: Owens Loffler, MD;  Location: [facility];  Service: Endoscopy;  Laterality: N/A;  . EYE SURGERY Bilateral    Bi lateral cateracts and bi lateral laser  . LAPAROSCOPY N/A 02/25/2013   Procedure: Cystoscopy and laparoscopy with fulguration of uterine serosa;  Surgeon: Jamey Reas de Berton Lan, MD;  Location: [facility];  Service: Gynecology;  Laterality: N/A;  . TONSILLECTOMY      Family History  Problem Relation  Age of Onset  . Diabetes Mother   . Stroke Father        deceased age 86  . Heart disease Sister        deceased MI age 33  . Heart disease Brother        deceased MI age 47  . Diabetes Maternal Grandmother   . Hypertension Paternal Grandmother   . Diabetes Sister   . Heart disease Sister   . Hypertension Sister   . Hyperlipidemia Sister   . Diabetes Sister   . Heart disease Sister   . Hypertension Sister   . Hyperlipidemia Sister   . Diabetes Brother   . Heart disease Brother   . Hypertension Brother   . Hyperlipidemia Brother   . Colon cancer Neg Hx   . Esophageal cancer Neg Hx   . Stomach cancer Neg Hx   . Rectal cancer Neg Hx     Social History   Socioeconomic History  . Marital status: Widowed    Spouse name: Not on file  . Number of children: 0  . Years of education: college  . Highest education level: Not on file  Occupational History  . Occupation: retired  Tobacco Use  . Smoking status: Never Smoker  . Smokeless tobacco: Never Used  . Tobacco comment: Never used tobacco  Vaping Use  . Vaping Use: Never used  Substance and Sexual Activity  . Alcohol use: No    Alcohol/week: 0.0 standard drinks  . Drug use: No  . Sexual activity: Never    Partners: Male    Birth control/protection: Post-menopausal    Comment: lives alone, no dietary restrictions except avoid fresh veg, fruit, whole grains  Other Topics Concern  . Not on file  Social History Narrative   Patient was married ([name]) - widow   Patient does not have any children.   Patient is right-handed.   Patient has a BA degree.   One caffeine drink daily    Social Determinants of Health   Financial Resource Strain:   . Difficulty of Paying Living Expenses:   Food Insecurity:   . Worried About Charity fundraiser in the Last Year:   . Arboriculturist in the Last Year:   Transportation Needs:   . Film/video editor (Medical):   Marland Kitchen Lack of Transportation (Non-Medical):   Physical Activity:    . Days of Exercise per Week:   . Minutes of Exercise per Session:   Stress:   . Feeling of Stress :   Social Connections:   . Frequency of Communication with Friends and Family:   . Frequency of Social Gatherings with Friends and Family:   . Attends Religious Services:   . Active Member of Clubs or Organizations:   . Attends Archivist Meetings:   Marland Kitchen Marital Status:   Intimate Partner Violence:   . Fear of Current or Ex-Partner:   . Emotionally Abused:   Marland Kitchen Physically Abused:   . Sexually  Abused:     Outpatient Medications Prior to Visit  Medication Sig Dispense Refill  . amLODipine (NORVASC) 5 MG tablet TAKE 1 TABLET BY MOUTH  DAILY 90 tablet 3  . aspirin 81 MG tablet Take 81 mg by mouth daily.    . betamethasone valerate ointment (VALISONE) 0.1 % Apply qod to affected area, small amount 45 g 1  . Blood Glucose Monitoring Suppl (ONETOUCH ULTRALINK) w/Device KIT Use to check blood sugars BID E11.69 1 kit 0  . cholecalciferol (VITAMIN D) 1000 UNITS tablet Take 1,000 Units by mouth daily.      Marland Kitchen dicyclomine (BENTYL) 10 MG capsule TAKE ONE CAPSULE BY MOUTH 4 TIMES DAILY AS NEEDED 099 capsule 1  . folic acid-pyridoxine-cyancobalamin (FOLBIC) 2.5-25-2 MG TABS tablet Take 1 tab po M,W,F 90 tablet 1  . glucose blood (ONETOUCH ULTRA) test strip Check blood sugar 3 times a day or as needed.  Dx Code: E11.9 300 each 1  . hydrALAZINE (APRESOLINE) 10 MG tablet Take 1 tablet (10 mg total) by mouth 2 (two) times daily. (Patient taking differently: Take 10 mg by mouth 2 (two) times daily as needed. ) 60 tablet 3  . Lancets (ONETOUCH DELICA PLUS [id number]) MISC Use as directed 3 times a day.  DX code: E11.69 300 each 1  . losartan (COZAAR) 50 MG tablet TAKE 1 TABLET BY MOUTH  TWICE DAILY 180 tablet 3  . magic mouthwash w/lidocaine SOLN 5 ml po qid swish and spit. 200 mL 0  . metFORMIN (GLUCOPHAGE-XR) 500 MG 24 hr tablet TAKE 1 TABLET BY MOUTH 3  TIMES DAILY WITH MEALS 270 tablet 3  .  metoCLOPramide (REGLAN) 5 MG tablet TAKE 1 TABLET BY MOUTH  TWICE DAILY 180 tablet 0  . metoprolol tartrate (LOPRESSOR) 50 MG tablet TAKE 1 AND 1/2 TABLETS BY  MOUTH TWICE DAILY 270 tablet 3  . NEXIUM 40 MG capsule Take 1 capsule (40 mg total) by mouth daily. 90 capsule 3  . ondansetron (ZOFRAN) 4 MG tablet Take 4 mg by mouth every 4 (four) hours as needed. For nausea      . potassium chloride (KLOR-CON) 10 MEQ tablet TAKE 1 TABLET BY MOUTH  DAILY 90 tablet 3  . rosuvastatin (CRESTOR) 10 MG tablet Take 1 tablet (10 mg total) by mouth daily. 90 tablet 1  . sodium chloride (OCEAN) 0.65 % SOLN nasal spray Place 1 spray into both nostrils as needed for congestion. 15 mL 0  . famotidine (PEPCID) 20 MG tablet Take 1 tablet (20 mg total) by mouth at bedtime. 30 tablet 0   No facility-administered medications prior to visit.    Allergies  Allergen Reactions  . Tramadol Other (See Comments)    Dizziness     Review of Systems  Constitutional: Positive for malaise/fatigue. Negative for fever.  HENT: Negative for congestion.   Eyes: Negative for blurred vision.  Respiratory: Negative for shortness of breath.   Cardiovascular: Negative for chest pain, palpitations and leg swelling.  Gastrointestinal: Positive for heartburn. Negative for abdominal pain, blood in stool and nausea.  Genitourinary: Negative for dysuria and frequency.  Musculoskeletal: Negative for falls.  Skin: Negative for rash.  Neurological: Positive for dizziness. Negative for loss of consciousness and headaches.  Endo/Heme/Allergies: Negative for environmental allergies.  Psychiatric/Behavioral: Negative for depression. The patient is not nervous/anxious.        Objective:    Physical Exam unable to obtain via phone visit  LMP 02/07/1992  Wt Readings from Last 3 Encounters:  07/28/19 165 lb (74.8 kg)  08/08/19 170 lb (77.1 kg)  07/08/19 168 lb (76.2 kg)    Diabetic Foot Exam - Simple   No data filed     Lab  Results  Component Value Date   WBC 8.3 08/08/2019   HGB 12.4 08/08/2019   HCT 37.1 08/08/2019   PLT 205.0 08/08/2019   GLUCOSE 95 08/08/2019   CHOL 209 (H) 06/13/2019   TRIG 136.0 06/13/2019   HDL 61.90 06/13/2019   LDLDIRECT 123.7 04/13/2011   LDLCALC 120 (H) 06/13/2019   ALT 18 08/08/2019   AST 17 08/08/2019   NA 132 (L) 08/08/2019   K 4.5 08/08/2019   CL 96 08/08/2019   CREATININE 0.77 08/08/2019   BUN 18 08/08/2019   CO2 29 08/08/2019   TSH 1.44 08/08/2019   INR 0.88 01/16/2011   HGBA1C 6.5 06/13/2019   MICROALBUR 1.6 11/13/2014    Lab Results  Component Value Date   TSH 1.44 08/08/2019   Lab Results  Component Value Date   WBC 8.3 08/08/2019   HGB 12.4 08/08/2019   HCT 37.1 08/08/2019   MCV 85.9 08/08/2019   PLT 205.0 08/08/2019   Lab Results  Component Value Date   NA 132 (L) 08/08/2019   K 4.5 08/08/2019   CHLORIDE 101 07/08/2015   CO2 29 08/08/2019   GLUCOSE 95 08/08/2019   BUN 18 08/08/2019   CREATININE 0.77 08/08/2019   BILITOT 0.6 08/08/2019   ALKPHOS 58 08/08/2019   AST 17 08/08/2019   ALT 18 08/08/2019   PROT 7.1 08/08/2019   ALBUMIN 4.5 08/08/2019   CALCIUM 9.9 08/08/2019   ANIONGAP 9 12/26/2017   EGFR 75 (L) 07/08/2015   GFR 72.43 08/08/2019   Lab Results  Component Value Date   CHOL 209 (H) 06/13/2019   Lab Results  Component Value Date   HDL 61.90 06/13/2019   Lab Results  Component Value Date   LDLCALC 120 (H) 06/13/2019   Lab Results  Component Value Date   TRIG 136.0 06/13/2019   Lab Results  Component Value Date   CHOLHDL 3 06/13/2019   Lab Results  Component Value Date   HGBA1C 6.5 06/13/2019       Assessment & Plan:   Problem List Items Addressed This Visit    Diabetes mellitus type 2 in obese ([place]) (Chronic)    hgba1c acceptable, minimize simple carbs. Increase exercise as tolerated. Continue current meds      Essential hypertension (Chronic)    Monitor and report any concerns, no changes to meds.  Encouraged heart healthy diet such as the DASH diet and exercise as tolerated.       Anemia (Chronic)    Increase leafy greens, consider increased lean red meat and using cast iron cookware. Continue to monitor, report any concerns, resolved on last check      Vitamin D deficiency    Supplement and monitor       Hyponatremia    Mild, asymptomatic. Drop water intake by one beverage a day from 7 glasses a day to 6 glasses.       GERD (gastroesophageal reflux disease)    Avoid offending foods, start probiotics. Do not eat large meals in late evening and consider raising head of bed. She is feeling much better, is on the Nexium in the morning and is not taking the Famotidine and still feels well. Use Famotidine prn if symptoms return. Continue Nexium daily. She will follow up with gastroenterology if symptoms  return.       Relevant Medications   famotidine (PEPCID) 20 MG tablet   Dizzy spells    Still having episodes of feeling light headed and tired. Encouraged to hydrate well and eat Protein every 4-5 hours. Report worsening symptoms      Thrush    Resolved with Magic Mouthwash she can restart Famotidine and Magic Mouthwash, she is encouraged to minimize carbohydrate intake       Renal insufficiency    Hydrate and monitor         I have changed Rayvon N. Katzman's famotidine. I am also having her maintain her cholecalciferol, ondansetron, aspirin, sodium chloride, hydrALAZINE, OneTouch UltraLink, NexIUM, Folbic, metoprolol tartrate, metoCLOPramide, losartan, metFORMIN, betamethasone valerate ointment, OneTouch Ultra, rosuvastatin, amLODipine, potassium chloride, magic mouthwash w/lidocaine, dicyclomine, and OneTouch Delica Plus [id number].  Meds ordered this encounter  Medications  . famotidine (PEPCID) 20 MG tablet    Sig: Take 1 tablet (20 mg total) by mouth at bedtime as needed for heartburn or indigestion.    Dispense:  30 tablet    Refill:  0     I discussed the  assessment and treatment plan with the patient. The patient was provided an opportunity to ask questions and all were answered. The patient agreed with the plan and demonstrated an understanding of the instructions.   The patient was advised to call back or seek an in-person evaluation if the symptoms worsen or if the condition fails to improve as anticipated.  I provided 25 minutes of non-face-to-face time during this encounter.   Penni Homans, MD

## 2019-09-09 NOTE — Addendum Note (Signed)
Addended by: Penni Homans A on: 09/09/2019 01:31 PM   Modules accepted: Level of Service

## 2019-10-28 ENCOUNTER — Other Ambulatory Visit: Payer: Self-pay | Admitting: Family Medicine

## 2019-11-11 NOTE — Addendum Note (Signed)
Addended by: Kelle Darting A on: 11/11/2019 11:17 AM   Modules accepted: Orders

## 2019-11-12 DIAGNOSIS — E785 Hyperlipidemia, unspecified: Secondary | ICD-10-CM | POA: Insufficient documentation

## 2019-11-12 NOTE — Progress Notes (Signed)
Cardiology Office Note   Date:  11/13/2019   ID:  [name], DOB 1940/07/09, MRN [id number]  PCP:  [name], MD  Cardiologist:   Minus Breeding, MD   Chief Complaint  Patient presents with  . Dizziness      History of Present Illness: [name] is a 79 y.o. female who  presents for followup of SVT. Since I last saw her she said she feels lightheaded at times.  She has not been taking her hydralazine at all.  She shows me some sporadic blood pressure readings with some readings in the 150s but she says it is typically in the 1 teens and she wants that reduce her medicines.  However, I do not have enough data.  She gets dizzy but she is not having presyncope or syncope.  She is not having any chest pressure, neck or arm discomfort.  She is having no new shortness of breath, PND or orthopnea.  Said no weight gain or edema.  Past Medical History:  Diagnosis Date  . Abdominal pain in female 03/18/2010   Qualifier: Diagnosis of  By: Carlean Purl MD, Dimas Millin Anemia 06/08/2014  . Anxiety   . Arthritis    Spinal Osteoarthritis  . Cancer ([place])   . Carcinoid tumor of stomach   . Cataract   . Chest pain    Myoview 12/15 no ischemia.  . Chronic kidney disease    Left kidney smaller than right kidney  . Constipation 11/21/2016  . Diabetes mellitus type 2 in obese ([place]) 09/05/2006   Qualifier: Diagnosis of  By: Marca Ancona RMA, Lucy    . Diabetic peripheral neuropathy ([place]) 10/29/2013  . Encounter for preventative adult health care exam with abnormal findings 09/14/2013  . Esophageal reflux   . Gastric polyp    Fundic Gland  . Gastroparesis   . Headache(784.0)   . Heart murmur    Echocardiogram 2/11: EF 60-65%, mild LAE, grade 1 diastolic dysfunction, aortic valve sclerosis, mean gradient 9 mm of mercury, PASP 34  . Hematuria 03/16/2016  . Iron deficiency anemia, unspecified   . 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 Leg swelling    bilateral  . Neck pain 04/22/2015   . PONV (postoperative nausea and vomiting)    pt states only needs small amount of anesthesia  . PSVT (paroxysmal supraventricular tachycardia) ([place])   . Pure hypercholesterolemia   . 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 Renal insufficiency 06/18/2019  . Stroke ([place])    tia, 2014  . 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 Type II or unspecified type diabetes mellitus without mention of complication, not stated as uncontrolled   . Unspecified essential hypertension   . Unspecified hereditary and idiopathic peripheral neuropathy 10/29/2013    Past Surgical History:  Procedure Laterality Date  . CHOLECYSTECTOMY  1993  . COLONOSCOPY  11/11/2010   diverticulosis  . DILATATION & CURRETTAGE/HYSTEROSCOPY WITH RESECTOCOPE N/A 02/25/2013   Procedure: Attempted hysteroscopy with uterine perforation;  Surgeon: Jamey Reas de Berton Lan, MD;  Location: [facility];  Service: Gynecology;  Laterality: N/A;  . ESOPHAGOGASTRODUODENOSCOPY  08/29/2010; 09/15/2010   Carcinoid tumor less than 1 cm in July 2012 not seen in August 2012 , gastritis, fundic gland polyps  . ESOPHAGOGASTRODUODENOSCOPY  05/16/2011  . ESOPHAGOGASTRODUODENOSCOPY  06/14/2012  . EUS  12/15/2010   Procedure: UPPER ENDOSCOPIC ULTRASOUND (EUS) LINEAR;  Surgeon: Owens Loffler, MD;  Location: [facility];  Service: Endoscopy;  Laterality: N/A;  .  EYE SURGERY Bilateral    Bi lateral cateracts and bi lateral laser  . LAPAROSCOPY N/A 02/25/2013   Procedure: Cystoscopy and laparoscopy with fulguration of uterine serosa;  Surgeon: Jamey Reas de Berton Lan, MD;  Location: [facility];  Service: Gynecology;  Laterality: N/A;  . TONSILLECTOMY       Current Outpatient Medications  Medication Sig Dispense Refill  . amLODipine (NORVASC) 5 MG tablet TAKE 1 TABLET BY MOUTH  DAILY 90 tablet 3  . aspirin 81 MG tablet Take 81 mg by mouth daily.    . betamethasone valerate ointment (VALISONE) 0.1 % Apply qod to affected area, small amount 45 g 1  . Blood Glucose  Monitoring Suppl (ONETOUCH ULTRALINK) w/Device KIT Use to check blood sugars BID E11.69 1 kit 0  . cholecalciferol (VITAMIN D) 1000 UNITS tablet Take 1,000 Units by mouth daily.      Marland Kitchen dicyclomine (BENTYL) 10 MG capsule TAKE ONE CAPSULE BY MOUTH 4 TIMES DAILY AS NEEDED 120 capsule 1  . famotidine (PEPCID) 20 MG tablet Take 1 tablet (20 mg total) by mouth at bedtime as needed for heartburn or indigestion. 30 tablet 0  . glucose blood (ONETOUCH ULTRA) test strip CHECK BLOOD SUGAR 3 TIMES  DAILY OR AS NEEDED. 300 strip 3  . hydrALAZINE (APRESOLINE) 10 MG tablet Take 10 mg by mouth as needed.    . Lancets (ONETOUCH DELICA PLUS [id number]) MISC Use as directed 3 times a day.  DX code: E11.69 300 each 1  . losartan (COZAAR) 50 MG tablet TAKE 1 TABLET BY MOUTH  TWICE DAILY 180 tablet 3  . metFORMIN (GLUCOPHAGE-XR) 500 MG 24 hr tablet TAKE 1 TABLET BY MOUTH 3  TIMES DAILY WITH MEALS 270 tablet 3  . metoCLOPramide (REGLAN) 5 MG tablet TAKE 1 TABLET BY MOUTH  TWICE DAILY 180 tablet 0  . metoprolol tartrate (LOPRESSOR) 50 MG tablet TAKE 1 AND 1/2 TABLETS BY  MOUTH TWICE DAILY 270 tablet 3  . NEXIUM 40 MG capsule Take 1 capsule (40 mg total) by mouth daily. 90 capsule 3  . ondansetron (ZOFRAN) 4 MG tablet Take 4 mg by mouth every 4 (four) hours as needed. For nausea      . potassium chloride (KLOR-CON) 10 MEQ tablet TAKE 1 TABLET BY MOUTH  DAILY 90 tablet 3  . rosuvastatin (CRESTOR) 10 MG tablet Take 1 tablet (10 mg total) by mouth daily. 90 tablet 1  . sodium chloride (OCEAN) 0.65 % SOLN nasal spray Place 1 spray into both nostrils as needed for congestion. 15 mL 0   No current facility-administered medications for this visit.    Allergies:   Tramadol    ROS:  Please see the history of present illness.   Otherwise, review of systems are positive for abdominal discomfort..   All other systems are reviewed and negative.    PHYSICAL EXAM: VS:  BP 138/64   Pulse 67   Ht $R'5\' 1"'Qc$  (1.549 m)   Wt 170 lb  (77.1 kg)   LMP 02/07/1992   SpO2 99%   BMI 32.12 kg/m  , BMI Body mass index is 32.12 kg/m. GENERAL:  Well appearing NECK:  No jugular venous distention, waveform within normal limits, carotid upstroke brisk and symmetric, no bruits, no thyromegaly LUNGS:  Clear to auscultation bilaterally CHEST:  Unremarkable HEART:  PMI not displaced or sustained,S1 and S2 within normal limits, no S3, no S4, no clicks, no rubs, no murmurs ABD:  Flat, positive bowel sounds normal  in frequency in pitch, no bruits, no rebound, no guarding, no midline pulsatile mass, no hepatomegaly, no splenomegaly EXT:  2 plus pulses throughout, no edema, no cyanosis no clubbing   EKG:  EKG is not ordered today.   Recent Labs: 08/08/2019: ALT 18; BUN 18; Creatinine, Ser 0.77; Hemoglobin 12.4; Platelets 205.0; Potassium 4.5; Sodium 132; TSH 1.44    Lipid Panel    Component Value Date/Time   CHOL 209 (H) 06/13/2019 0921   TRIG 136.0 06/13/2019 0921   TRIG 94 11/16/2005 0905   HDL 61.90 06/13/2019 0921   CHOLHDL 3 06/13/2019 0921   VLDL 27.2 06/13/2019 0921   LDLCALC 120 (H) 06/13/2019 0921   LDLDIRECT 123.7 04/13/2011 0848      Wt Readings from Last 3 Encounters:  11/13/19 170 lb (77.1 kg)  07/28/19 165 lb (74.8 kg)  08/08/19 170 lb (77.1 kg)      Other studies Reviewed: Additional studies/ records that were reviewed today include: None. Review of the above records demonstrates:  Please see elsewhere in the note.     ASSESSMENT AND PLAN:   SVT:    She is not had any new episodes of this.  No change in therapy.  Hypertension- Her blood pressure is labile.  I had her keep a 2-week blood pressure diary and I will try to reduce her medications if I can.   Dyslipidemia - Her LDL is elevated and blood was drawn today.  If she is not below 100 LDL I am going to increase her Crestor.   Carotid stenosis - This was mild in 2019.  No further imaging.   Aortic stenosis- This is very mild in the  past and not evident on the most recent echo in 2019.  No further imaging at this time.  Aortic atherosclerosis - She will continue with risk reduction.  She will continue with aggressive risk reduction as above.    Covid education - She has been vaccinated.  Current medicines are reviewed at length with the patient today.  The patient does not have concerns regarding medicines.  The following changes have been made:  no change  Labs/ tests ordered today include:   No orders of the defined types were placed in this encounter.    Disposition:   FU with me in six months.     Signed, Minus Breeding, MD  11/13/2019 2:27 PM    [facility]

## 2019-11-13 ENCOUNTER — Ambulatory Visit: Payer: Medicare Other | Admitting: Cardiology

## 2019-11-13 ENCOUNTER — Encounter: Payer: Self-pay | Admitting: Cardiology

## 2019-11-13 ENCOUNTER — Other Ambulatory Visit: Payer: Self-pay

## 2019-11-13 ENCOUNTER — Other Ambulatory Visit (INDEPENDENT_AMBULATORY_CARE_PROVIDER_SITE_OTHER): Payer: Medicare Other

## 2019-11-13 VITALS — BP 138/64 | HR 67 | Ht 61.0 in | Wt 170.0 lb

## 2019-11-13 DIAGNOSIS — E538 Deficiency of other specified B group vitamins: Secondary | ICD-10-CM

## 2019-11-13 DIAGNOSIS — E669 Obesity, unspecified: Secondary | ICD-10-CM

## 2019-11-13 DIAGNOSIS — I471 Supraventricular tachycardia: Secondary | ICD-10-CM | POA: Diagnosis not present

## 2019-11-13 DIAGNOSIS — E782 Mixed hyperlipidemia: Secondary | ICD-10-CM

## 2019-11-13 DIAGNOSIS — I10 Essential (primary) hypertension: Secondary | ICD-10-CM | POA: Diagnosis not present

## 2019-11-13 DIAGNOSIS — E785 Hyperlipidemia, unspecified: Secondary | ICD-10-CM | POA: Diagnosis not present

## 2019-11-13 DIAGNOSIS — E1169 Type 2 diabetes mellitus with other specified complication: Secondary | ICD-10-CM

## 2019-11-13 DIAGNOSIS — I6529 Occlusion and stenosis of unspecified carotid artery: Secondary | ICD-10-CM

## 2019-11-13 DIAGNOSIS — I700 Atherosclerosis of aorta: Secondary | ICD-10-CM

## 2019-11-13 DIAGNOSIS — R5383 Other fatigue: Secondary | ICD-10-CM | POA: Diagnosis not present

## 2019-11-13 NOTE — Patient Instructions (Addendum)
Medication Instructions:  Your physician recommends that you continue on your current medications as directed. Please refer to the Current Medication list given to you today.  *If you need a refill on your cardiac medications before your next appointment, please call your pharmacy*  Lab Work: NONE ordered at this time of appointment   If you have labs (blood work) drawn today and your tests are completely normal, you will receive your results only by: Marland Kitchen MyChart Message (if you have MyChart) OR . A paper copy in the mail If you have any lab test that is abnormal or we need to change your treatment, we will call you to review the results.  Testing/Procedures: NONE ordered at this time of appointment   Follow-Up: At [facility], you and your health needs are our priority.  As part of our continuing mission to provide you with exceptional heart care, we have created designated Provider Care Teams.  These Care Teams include your primary Cardiologist (physician) and Advanced Practice Providers (APPs -  Physician Assistants and Nurse Practitioners) who all work together to provide you with the care you need, when you need it.  Your next appointment:   6 month(s)  The format for your next appointment:   In Person  Provider:   Minus Breeding, MD   Other Instructions  Monitor your blood pressure 3 times a day and log it. After 2 weeks of BP readings drop off at the office.

## 2019-11-14 ENCOUNTER — Other Ambulatory Visit: Payer: Self-pay | Admitting: Internal Medicine

## 2019-11-14 ENCOUNTER — Telehealth: Payer: Self-pay | Admitting: Family Medicine

## 2019-11-14 ENCOUNTER — Other Ambulatory Visit: Payer: Self-pay | Admitting: Cardiology

## 2019-11-14 LAB — HEMOGLOBIN A1C
Hgb A1c MFr Bld: 6.4 % of total Hgb — ABNORMAL HIGH (ref <5.7)
Mean Plasma Glucose: 137 (calc)
eAG (mmol/L): 7.6 (calc)

## 2019-11-14 LAB — COMPREHENSIVE METABOLIC PANEL
AG Ratio: 1.6 (calc) (ref 1.0–2.5)
ALT: 21 U/L (ref 6–29)
AST: 19 U/L (ref 10–35)
Albumin: 4.4 g/dL (ref 3.6–5.1)
Alkaline phosphatase (APISO): 55 U/L (ref 37–153)
BUN: 19 mg/dL (ref 7–25)
CO2: 24 mmol/L (ref 20–32)
Calcium: 10 mg/dL (ref 8.6–10.4)
Chloride: 99 mmol/L (ref 98–110)
Creat: 0.78 mg/dL (ref 0.60–0.93)
Globulin: 2.7 g/dL (calc) (ref 1.9–3.7)
Glucose, Bld: 137 mg/dL — ABNORMAL HIGH (ref 65–99)
Potassium: 4.1 mmol/L (ref 3.5–5.3)
Sodium: 136 mmol/L (ref 135–146)
Total Bilirubin: 0.8 mg/dL (ref 0.2–1.2)
Total Protein: 7.1 g/dL (ref 6.1–8.1)

## 2019-11-14 LAB — VITAMIN B12: Vitamin B-12: 394 pg/mL (ref 200–1100)

## 2019-11-14 LAB — CBC
HCT: 38.1 % (ref 35.0–45.0)
Hemoglobin: 12.6 g/dL (ref 11.7–15.5)
MCH: 28.4 pg (ref 27.0–33.0)
MCHC: 33.1 g/dL (ref 32.0–36.0)
MCV: 86 fL (ref 80.0–100.0)
MPV: 10.4 fL (ref 7.5–12.5)
Platelets: 250 10*3/uL (ref 140–400)
RBC: 4.43 10*6/uL (ref 3.80–5.10)
RDW: 11.9 % (ref 11.0–15.0)
WBC: 7.8 10*3/uL (ref 3.8–10.8)

## 2019-11-14 LAB — LIPID PANEL
Cholesterol: 205 mg/dL — ABNORMAL HIGH (ref <200)
HDL: 63 mg/dL (ref >=50)
LDL Cholesterol (Calc): 116 mg/dL (calc) — ABNORMAL HIGH
Non-HDL Cholesterol (Calc): 142 mg/dL (calc) — ABNORMAL HIGH (ref <130)
Total CHOL/HDL Ratio: 3.3 (calc) (ref <5.0)
Triglycerides: 146 mg/dL (ref <150)

## 2019-11-14 LAB — TSH: TSH: 1.58 mIU/L (ref 0.40–4.50)

## 2019-11-14 NOTE — Telephone Encounter (Signed)
No new concerns. No changes. I think she has an appointment soon I think so we can discuss

## 2019-11-14 NOTE — Telephone Encounter (Signed)
May we refill Sir, thank you.

## 2019-11-14 NOTE — Telephone Encounter (Signed)
Patient states she would like lab results

## 2019-11-14 NOTE — Telephone Encounter (Signed)
Called pt given A1C HDL and LDL levels along with B12 going to specialty provider. willing to discuss at upcoming appointment

## 2019-11-14 NOTE — Telephone Encounter (Signed)
Lab results forwarded to pcp to advise on pt results

## 2019-11-14 NOTE — Telephone Encounter (Signed)
Rx has been sent to the pharmacy electronically. ° °

## 2019-11-18 ENCOUNTER — Ambulatory Visit (INDEPENDENT_AMBULATORY_CARE_PROVIDER_SITE_OTHER): Payer: Medicare Other | Admitting: Family Medicine

## 2019-11-18 ENCOUNTER — Encounter: Payer: Self-pay | Admitting: Family Medicine

## 2019-11-18 ENCOUNTER — Other Ambulatory Visit: Payer: Self-pay

## 2019-11-18 VITALS — BP 126/63 | HR 63 | Temp 97.7°F | Resp 12 | Ht 61.0 in | Wt 170.6 lb

## 2019-11-18 DIAGNOSIS — I10 Essential (primary) hypertension: Secondary | ICD-10-CM

## 2019-11-18 DIAGNOSIS — E2839 Other primary ovarian failure: Secondary | ICD-10-CM

## 2019-11-18 DIAGNOSIS — E785 Hyperlipidemia, unspecified: Secondary | ICD-10-CM

## 2019-11-18 DIAGNOSIS — Z780 Asymptomatic menopausal state: Secondary | ICD-10-CM | POA: Diagnosis not present

## 2019-11-18 DIAGNOSIS — E559 Vitamin D deficiency, unspecified: Secondary | ICD-10-CM

## 2019-11-18 DIAGNOSIS — Z23 Encounter for immunization: Secondary | ICD-10-CM

## 2019-11-18 DIAGNOSIS — E1169 Type 2 diabetes mellitus with other specified complication: Secondary | ICD-10-CM | POA: Diagnosis not present

## 2019-11-18 DIAGNOSIS — D538 Other specified nutritional anemias: Secondary | ICD-10-CM

## 2019-11-18 DIAGNOSIS — N289 Disorder of kidney and ureter, unspecified: Secondary | ICD-10-CM

## 2019-11-18 DIAGNOSIS — G6289 Other specified polyneuropathies: Secondary | ICD-10-CM

## 2019-11-18 DIAGNOSIS — K146 Glossodynia: Secondary | ICD-10-CM

## 2019-11-18 DIAGNOSIS — E669 Obesity, unspecified: Secondary | ICD-10-CM

## 2019-11-18 DIAGNOSIS — E7800 Pure hypercholesterolemia, unspecified: Secondary | ICD-10-CM

## 2019-11-18 MED ORDER — THIAMINE HCL 100 MG PO TABS: 100.0000 mg | ORAL_TABLET | Freq: Every day | ORAL | 1 refills | Status: DC

## 2019-11-18 MED ORDER — METFORMIN HCL ER 500 MG PO TB24
500.0000 mg | ORAL_TABLET | Freq: Two times a day (BID) | ORAL | 1 refills | Status: DC
Start: 2019-11-18 — End: 2020-03-25

## 2019-11-18 MED ORDER — ROSUVASTATIN CALCIUM 20 MG PO TABS: 20.0000 mg | ORAL_TABLET | Freq: Every day | ORAL | 1 refills | Status: DC

## 2019-11-18 NOTE — Patient Instructions (Addendum)
Magnesium glycinate 400 mg at bedtime  Mulltivitamin with minerals every day   Call [phone] to fix my chart

## 2019-11-18 NOTE — Progress Notes (Signed)
[id number]

## 2019-11-19 DIAGNOSIS — K146 Glossodynia: Secondary | ICD-10-CM | POA: Insufficient documentation

## 2019-11-19 DIAGNOSIS — G629 Polyneuropathy, unspecified: Secondary | ICD-10-CM | POA: Insufficient documentation

## 2019-11-19 LAB — VITAMIN B1: Vitamin B1 (Thiamine): 9 nmol/L (ref 8–30)

## 2019-11-19 LAB — VITAMIN B6: Vitamin B6: 14.9 ng/mL (ref 2.1–21.7)

## 2019-11-19 NOTE — Assessment & Plan Note (Signed)
Encouraged heart healthy diet, increase exercise, avoid trans fats, consider a krill oil cap daily 

## 2019-11-19 NOTE — Assessment & Plan Note (Signed)
Well controlled, no changes to meds. Encouraged heart healthy diet such as the DASH diet and exercise as tolerated.  °

## 2019-11-19 NOTE — Assessment & Plan Note (Signed)
And intermittent sores. No obvious cause with previous work up. She is encouraged to start a multivitamin with minerals daioy to see if she improves and check labs

## 2019-11-19 NOTE — Assessment & Plan Note (Signed)
With low thiamine levels so start Thiamine 100 mg daily

## 2019-11-19 NOTE — Progress Notes (Signed)
Subjective:    [name], female    DOB: 09-27-1940, 79 y.o.   MRN: [id number]  Chief Complaint  Patient presents with  . Follow-up    HPI Patient is in today for follow up on chronic medical concerns. No recent febrile illness or hospitalizations. No polyuria or polydipsia. She is trying to eat well and maintain a decent activity level. She still struggles with burning in mouth and intermittent oral sores. Notes some peripheral neuropathy or burning in feet as well. Denies CP/palp/SOB/HA/congestion/fevers/GI or GU c/o. Taking meds as prescribed  Past Medical History:  Diagnosis Date  . Abdominal pain in female 03/18/2010   Qualifier: Diagnosis of  By: Carlean Purl MD, Dimas Millin Anemia 06/08/2014  . Anxiety   . Arthritis    Spinal Osteoarthritis  . Cancer ([place])   . Carcinoid tumor of stomach   . Cataract   . Chest pain    Myoview 12/15 no ischemia.  . Chronic kidney disease    Left kidney smaller than right kidney  . Constipation 11/21/2016  . Diabetes mellitus type 2 in obese ([place]) 09/05/2006   Qualifier: Diagnosis of  By: Marca Ancona RMA, Lucy    . Diabetic peripheral neuropathy ([place]) 10/29/2013  . Encounter for preventative adult health care exam with abnormal findings 09/14/2013  . Esophageal reflux   . Gastric polyp    Fundic Gland  . Gastroparesis   . Headache(784.0)   . Heart murmur    Echocardiogram 2/11: EF 60-65%, mild LAE, grade 1 diastolic dysfunction, aortic valve sclerosis, mean gradient 9 mm of mercury, PASP 34  . Hematuria 03/16/2016  . Iron deficiency anemia, unspecified   . 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 Leg swelling    bilateral  . Neck pain 04/22/2015  . PONV (postoperative nausea and vomiting)    pt states only needs small amount of anesthesia  . PSVT (paroxysmal supraventricular tachycardia) ([place])   . Pure hypercholesterolemia   . 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 Renal insufficiency 06/18/2019  . Stroke ([place])    tia, 2014  . 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 Type II or unspecified type diabetes mellitus without mention of complication, not stated as uncontrolled   . Unspecified essential hypertension   . Unspecified hereditary and idiopathic peripheral neuropathy 10/29/2013    Past Surgical History:  Procedure Laterality Date  . CHOLECYSTECTOMY  1993  . COLONOSCOPY  11/11/2010   diverticulosis  . DILATATION & CURRETTAGE/HYSTEROSCOPY WITH RESECTOCOPE N/A 02/25/2013   Procedure: Attempted hysteroscopy with uterine perforation;  Surgeon: Jamey Reas de Berton Lan, MD;  Location: [facility];  Service: Gynecology;  Laterality: N/A;  . ESOPHAGOGASTRODUODENOSCOPY  08/29/2010; 09/15/2010   Carcinoid tumor less than 1 cm in July 2012 not seen in August 2012 , gastritis, fundic gland polyps  . ESOPHAGOGASTRODUODENOSCOPY  05/16/2011  . ESOPHAGOGASTRODUODENOSCOPY  06/14/2012  . EUS  12/15/2010   Procedure: UPPER ENDOSCOPIC ULTRASOUND (EUS) LINEAR;  Surgeon: Owens Loffler, MD;  Location: [facility];  Service: Endoscopy;  Laterality: N/A;  . EYE SURGERY Bilateral    Bi lateral cateracts and bi lateral laser  . LAPAROSCOPY N/A 02/25/2013   Procedure: Cystoscopy and laparoscopy with fulguration of uterine serosa;  Surgeon: Jamey Reas de Berton Lan, MD;  Location: [facility];  Service: Gynecology;  Laterality: N/A;  . TONSILLECTOMY      Family History  Problem Relation Age of Onset  . Diabetes Mother   . Stroke Father  deceased age 68  . Heart disease Sister        deceased MI age 59  . Heart disease Brother        deceased MI age 15  . Diabetes Maternal Grandmother   . Hypertension Paternal Grandmother   . Diabetes Sister   . Heart disease Sister   . Hypertension Sister   . Hyperlipidemia Sister   . Diabetes Sister   . Heart disease Sister   . Hypertension Sister   . Hyperlipidemia Sister   . Diabetes Brother   . Heart disease Brother   . Hypertension Brother   . Hyperlipidemia Brother   . Colon cancer Neg Hx   .  Esophageal cancer Neg Hx   . Stomach cancer Neg Hx   . Rectal cancer Neg Hx     Social History   Socioeconomic History  . Marital status: Widowed    Spouse name: Not on file  . Number of children: 0  . Years of education: college  . Highest education level: Not on file  Occupational History  . Occupation: retired  Tobacco Use  . Smoking status: Never Smoker  . Smokeless tobacco: Never Used  . Tobacco comment: Never used tobacco  Vaping Use  . Vaping Use: Never used  Substance and Sexual Activity  . Alcohol use: No    Alcohol/week: 0.0 standard drinks  . Drug use: No  . Sexual activity: Never    Partners: Male    Birth control/protection: Post-menopausal    Comment: lives alone, no dietary restrictions except avoid fresh veg, fruit, whole grains  Other Topics Concern  . Not on file  Social History Narrative   Patient was married ([name]) - widow   Patient does not have any children.   Patient is right-handed.   Patient has a BA degree.   One caffeine drink daily    Social Determinants of Health   Financial Resource Strain:   . Difficulty of Paying Living Expenses: Not on file  Food Insecurity:   . Worried About Charity fundraiser in the Last Year: Not on file  . Ran Out of Food in the Last Year: Not on file  Transportation Needs:   . Lack of Transportation (Medical): Not on file  . Lack of Transportation (Non-Medical): Not on file  Physical Activity:   . Days of Exercise per Week: Not on file  . Minutes of Exercise per Session: Not on file  Stress:   . Feeling of Stress : Not on file  Social Connections:   . Frequency of Communication with Friends and Family: Not on file  . Frequency of Social Gatherings with Friends and Family: Not on file  . Attends Religious Services: Not on file  . Active Member of Clubs or Organizations: Not on file  . Attends Archivist Meetings: Not on file  . Marital Status: Not on file  Intimate Partner Violence:   . Fear  of Current or Ex-Partner: Not on file  . Emotionally Abused: Not on file  . Physically Abused: Not on file  . Sexually Abused: Not on file    Outpatient Medications Prior to Visit  Medication Sig Dispense Refill  . amLODipine (NORVASC) 5 MG tablet TAKE 1 TABLET BY MOUTH  DAILY 90 tablet 3  . aspirin 81 MG tablet Take 81 mg by mouth daily.    . betamethasone valerate ointment (VALISONE) 0.1 % Apply qod to affected area, small amount 45 g 1  . Blood  Glucose Monitoring Suppl (ONETOUCH ULTRALINK) w/Device KIT Use to check blood sugars BID E11.69 1 kit 0  . cholecalciferol (VITAMIN D) 1000 UNITS tablet Take 1,000 Units by mouth daily.      Marland Kitchen dicyclomine (BENTYL) 10 MG capsule TAKE ONE CAPSULE BY MOUTH 4 TIMES DAILY AS NEEDED 120 capsule 1  . famotidine (PEPCID) 20 MG tablet Take 1 tablet (20 mg total) by mouth at bedtime as needed for heartburn or indigestion. 30 tablet 0  . glucose blood (ONETOUCH ULTRA) test strip CHECK BLOOD SUGAR 3 TIMES  DAILY OR AS NEEDED. 300 strip 3  . hydrALAZINE (APRESOLINE) 10 MG tablet Take 10 mg by mouth as needed.    . Lancets (ONETOUCH DELICA PLUS OEHOZY24M) MISC Use as directed 3 times a day.  DX code: E11.69 300 each 1  . losartan (COZAAR) 50 MG tablet TAKE 1 TABLET BY MOUTH  TWICE DAILY 180 tablet 3  . metoCLOPramide (REGLAN) 5 MG tablet TAKE 1 TABLET BY MOUTH  TWICE DAILY 180 tablet 0  . metoprolol tartrate (LOPRESSOR) 50 MG tablet TAKE 1 AND 1/2 TABLETS BY  MOUTH TWICE DAILY 270 tablet 3  . NEXIUM 40 MG capsule Take 1 capsule (40 mg total) by mouth daily. 90 capsule 3  . ondansetron (ZOFRAN) 4 MG tablet Take 4 mg by mouth every 4 (four) hours as needed. For nausea      . potassium chloride (KLOR-CON) 10 MEQ tablet TAKE 1 TABLET BY MOUTH  DAILY 90 tablet 3  . sodium chloride (OCEAN) 0.65 % SOLN nasal spray Place 1 spray into both nostrils as needed for congestion. 15 mL 0  . metFORMIN (GLUCOPHAGE-XR) 500 MG 24 hr tablet TAKE 1 TABLET BY MOUTH 3  TIMES DAILY  WITH MEALS 270 tablet 3  . rosuvastatin (CRESTOR) 10 MG tablet Take 1 tablet (10 mg total) by mouth daily. 90 tablet 1   No facility-administered medications prior to visit.    Allergies  Allergen Reactions  . Tramadol Other (See Comments)    Dizziness     Review of Systems  Constitutional: Negative for fever and malaise/fatigue.  HENT: Negative for congestion.   Eyes: Negative for blurred vision.  Respiratory: Negative for shortness of breath.   Cardiovascular: Negative for chest pain, palpitations and leg swelling.  Gastrointestinal: Negative for abdominal pain, blood in stool and nausea.  Genitourinary: Negative for dysuria and frequency.  Musculoskeletal: Negative for falls.  Skin: Negative for rash.  Neurological: Negative for dizziness, loss of consciousness and headaches.  Endo/Heme/Allergies: Negative for environmental allergies.  Psychiatric/Behavioral: Negative for depression. The patient is not nervous/anxious.        Objective:    Physical Exam Vitals and nursing note reviewed.  Constitutional:      General: She is not in acute distress.    Appearance: She is well-developed.  HENT:     Head: Normocephalic and atraumatic.     Nose: Nose normal.  Eyes:     General:        Right eye: No discharge.        Left eye: No discharge.  Cardiovascular:     Rate and Rhythm: Normal rate and regular rhythm.     Heart sounds: No murmur heard.   Pulmonary:     Effort: Pulmonary effort is normal.     Breath sounds: Normal breath sounds.  Abdominal:     General: Bowel sounds are normal.     Palpations: Abdomen is soft.     Tenderness: There  is no abdominal tenderness.  Musculoskeletal:     Cervical back: Normal range of motion and neck supple.  Skin:    General: Skin is warm and dry.  Neurological:     Mental Status: She is alert and oriented to person, place, and time.     BP 126/63 (BP Location: Left Arm, Patient Position: Sitting, Cuff Size: Small)   Pulse  63   Temp 97.7 F (36.5 C) (Oral)   Resp 12   Ht 5' 1"  (1.549 m)   Wt 170 lb 9.6 oz (77.4 kg)   LMP 02/07/1992   SpO2 99%   BMI 32.23 kg/m  Wt Readings from Last 3 Encounters:  11/18/19 170 lb 9.6 oz (77.4 kg)  11/13/19 170 lb (77.1 kg)  07/28/19 165 lb (74.8 kg)    Diabetic Foot Exam - Simple   No data filed     Lab Results  Component Value Date   WBC 7.8 11/13/2019   HGB 12.6 11/13/2019   HCT 38.1 11/13/2019   PLT 250 11/13/2019   GLUCOSE 137 (H) 11/13/2019   CHOL 205 (H) 11/13/2019   TRIG 146 11/13/2019   HDL 63 11/13/2019   LDLDIRECT 123.7 04/13/2011   LDLCALC 116 (H) 11/13/2019   ALT 21 11/13/2019   AST 19 11/13/2019   NA 136 11/13/2019   K 4.1 11/13/2019   CL 99 11/13/2019   CREATININE 0.78 11/13/2019   BUN 19 11/13/2019   CO2 24 11/13/2019   TSH 1.58 11/13/2019   INR 0.88 01/16/2011   HGBA1C 6.4 (H) 11/13/2019   MICROALBUR 1.6 11/13/2014    Lab Results  Component Value Date   TSH 1.58 11/13/2019   Lab Results  Component Value Date   WBC 7.8 11/13/2019   HGB 12.6 11/13/2019   HCT 38.1 11/13/2019   MCV 86.0 11/13/2019   PLT 250 11/13/2019   Lab Results  Component Value Date   NA 136 11/13/2019   K 4.1 11/13/2019   CHLORIDE 101 07/08/2015   CO2 24 11/13/2019   GLUCOSE 137 (H) 11/13/2019   BUN 19 11/13/2019   CREATININE 0.78 11/13/2019   BILITOT 0.8 11/13/2019   ALKPHOS 58 08/08/2019   AST 19 11/13/2019   ALT 21 11/13/2019   PROT 7.1 11/13/2019   ALBUMIN 4.5 08/08/2019   CALCIUM 10.0 11/13/2019   ANIONGAP 9 12/26/2017   EGFR 75 (L) 07/08/2015   GFR 72.43 08/08/2019   Lab Results  Component Value Date   CHOL 205 (H) 11/13/2019   Lab Results  Component Value Date   HDL 63 11/13/2019   Lab Results  Component Value Date   LDLCALC 116 (H) 11/13/2019   Lab Results  Component Value Date   TRIG 146 11/13/2019   Lab Results  Component Value Date   CHOLHDL 3.3 11/13/2019   Lab Results  Component Value Date   HGBA1C 6.4 (H)  11/13/2019       Assessment & Plan:   Problem List Items Addressed This Visit    Diabetes mellitus type 2 in obese (HCC) (Chronic)    hgba1c acceptable, minimize simple carbs. Increase exercise as tolerated. Continue current meds      Relevant Medications   metFORMIN (GLUCOPHAGE-XR) 500 MG 24 hr tablet   rosuvastatin (CRESTOR) 20 MG tablet   Other Relevant Orders   Hemoglobin A1c   Comprehensive metabolic panel   Essential hypertension (Chronic)    Well controlled, no changes to meds. Encouraged heart healthy diet such as the DASH  diet and exercise as tolerated.       Relevant Medications   rosuvastatin (CRESTOR) 20 MG tablet   Other Relevant Orders   TSH   Anemia (Chronic)   Relevant Orders   CBC   HYPERCHOLESTEROLEMIA    Encouraged heart healthy diet, increase exercise, avoid trans fats, consider a krill oil cap daily, tolerating Rosuvastatin, no change in dosing today, refill given 20 mg       Relevant Medications   rosuvastatin (CRESTOR) 20 MG tablet   Other Relevant Orders   Lipid panel   Vitamin D deficiency    Supplement and monitor      Relevant Orders   VITAMIN D 25 Hydroxy (Vit-D Deficiency, Fractures)   Renal insufficiency   Relevant Orders   Comprehensive metabolic panel   RESOLVED: Dyslipidemia    Encouraged heart healthy diet, increase exercise, avoid trans fats, consider a krill oil cap daily      Relevant Medications   rosuvastatin (CRESTOR) 20 MG tablet   Burning tongue syndrome    And intermittent sores. No obvious cause with previous work up. She is encouraged to start a multivitamin with minerals daioy to see if she improves and check labs      Peripheral neuropathy    With low thiamine levels so start Thiamine 100 mg daily      Relevant Orders   Vitamin B12   Vitamin B1    Other Visit Diagnoses    Influenza vaccine needed    -  Primary   Relevant Orders   Flu Vaccine QUAD High Dose(Fluad) (Completed)   Estrogen deficiency        Relevant Orders   DG Bone Density   Post-menopausal       Relevant Orders   DG Bone Density      I have discontinued Etoy N. Hillock's rosuvastatin. I have also changed her metFORMIN. Additionally, I am having her start on rosuvastatin and thiamine. Lastly, I am having her maintain her cholecalciferol, ondansetron, aspirin, sodium chloride, OneTouch UltraLink, NexIUM, losartan, betamethasone valerate ointment, amLODipine, potassium chloride, dicyclomine, OneTouch Delica Plus [id number], famotidine, OneTouch Ultra, hydrALAZINE, metoCLOPramide, and metoprolol tartrate.  Meds ordered this encounter  Medications  . metFORMIN (GLUCOPHAGE-XR) 500 MG 24 hr tablet    Sig: Take 1 tablet (500 mg total) by mouth in the morning and at bedtime.    Dispense:  180 tablet    Refill:  1    Requesting 1 year supply  . rosuvastatin (CRESTOR) 20 MG tablet    Sig: Take 1 tablet (20 mg total) by mouth daily.    Dispense:  90 tablet    Refill:  1  . thiamine 100 MG tablet    Sig: Take 1 tablet (100 mg total) by mouth daily.    Dispense:  90 tablet    Refill:  1     Penni Homans, MD

## 2019-11-19 NOTE — Assessment & Plan Note (Signed)
hgba1c acceptable, minimize simple carbs. Increase exercise as tolerated. Continue current meds 

## 2019-11-19 NOTE — Assessment & Plan Note (Signed)
Supplement and monitor 

## 2019-11-19 NOTE — Assessment & Plan Note (Signed)
Encouraged heart healthy diet, increase exercise, avoid trans fats, consider a krill oil cap daily, tolerating Rosuvastatin, no change in dosing today, refill given 20 mg

## 2019-11-25 ENCOUNTER — Ambulatory Visit: Payer: Medicare Other | Admitting: Internal Medicine

## 2019-11-25 ENCOUNTER — Encounter: Payer: Self-pay | Admitting: Internal Medicine

## 2019-11-25 VITALS — BP 122/60 | HR 64 | Ht 61.0 in | Wt 171.0 lb

## 2019-11-25 DIAGNOSIS — K3184 Gastroparesis: Secondary | ICD-10-CM

## 2019-11-25 DIAGNOSIS — K30 Functional dyspepsia: Secondary | ICD-10-CM | POA: Diagnosis not present

## 2019-11-25 DIAGNOSIS — D131 Benign neoplasm of stomach: Secondary | ICD-10-CM | POA: Diagnosis not present

## 2019-11-25 DIAGNOSIS — K5730 Diverticulosis of large intestine without perforation or abscess without bleeding: Secondary | ICD-10-CM

## 2019-11-25 DIAGNOSIS — K581 Irritable bowel syndrome with constipation: Secondary | ICD-10-CM | POA: Diagnosis not present

## 2019-11-25 DIAGNOSIS — K146 Glossodynia: Secondary | ICD-10-CM

## 2019-11-25 NOTE — Patient Instructions (Signed)
We are putting you in our system for a EGD/colon recall for January 2022. We will reach out to you then.   Due to recent changes in healthcare laws, you may see the results of your imaging and laboratory studies on MyChart before your provider has had a chance to review them.  We understand that in some cases there may be results that are confusing or concerning to you. Not all laboratory results come back in the same time frame and the provider may be waiting for multiple results in order to interpret others.  Please give [place] 48 hours in order for your provider to thoroughly review all the results before contacting the office for clarification of your results.    I appreciate the opportunity to care for you. Silvano Rusk, MD, [facility]

## 2019-11-25 NOTE — Progress Notes (Signed)
[name] 79 y.o. May 16, 1940 [id number]  Assessment & Plan:   Encounter Diagnoses  Name Primary?  . Diverticulosis of colon without hemorrhage Yes  . Irritable bowel syndrome with constipation   . Functional dyspepsia   . Gastroparesis   . FUNDIC GLAND POLYPS OF STOMACH   . Burning tongue syndrome     She seems stable overall.  I do not think burning tongue syndrome is related to any of her GI problems though LPR with GERD could do that I do not think that is the case in her.  This is usually some sort of neuropathic condition though vitamin deficiency as detected by Dr. Randel Pigg could play a role in it makes sense to supplement.  I do not think she needs to stop her PPI though we discussed how she might taper that I have recommended she stay on it.  She should still be able to absorb thiamine and magnesium supplements.  Dicyclomine as needed works for her left lower quadrant pain that I think is symptomatic diverticulosis.  I explained that nuts are not precluded in diverticulosis as we once thought.  She wants to consider repeating a colonoscopy and upper endoscopy.  We did not decide that today we will revisit that early next year with a January recall.  Continue Reglan for gastroparesis no signs of toxicity she is on a very low dose  Subjective:   Chief Complaint: Follow-up of dyspepsia and IBS gastric polyps  HPI The patient is a 80 year old [place] woman with a history of IBS functional dyspepsia and gastroparesis in the setting of fundic gland polyposis and a history of an isolated gastric carcinoid here for follow-up.  She also has symptomatic diverticulosis.  She continues with intermittent left lower quadrant pain in the setting of chronic severe sigmoid diverticulosis.  Dicyclomine relieves it.  She reports that her stomach is "sometimes good and sometimes not".  Stable pattern of intermittent dyspepsia.  She tried to come off Nexium because she read that it  stopped all of her acid production and that is why she cannot absorb B complex vitamins and magnesium.  She has been having problems with burning tongue syndrome and Dr. Josepha Pigg found a low thiamine level and is recommended a magnesium supplement multivitamins and thiamine.  She only recently started that as she had a recent visit there.  She denies tongue ulcers.  She wonders or thinks that this is related to "my stomach".  She saw an ENT physician who said it was from her stomach.  I think she means from her reflux when questioned more closely.  Wt Readings from Last 3 Encounters:  11/25/19 171 lb (77.6 kg)  11/18/19 170 lb 9.6 oz (77.4 kg)  11/13/19 170 lb (77.1 kg)   Weights earlier this year and last year are very similar  Last EGD with fundic gland polyps 2018 Last colonoscopy 2012 no polyps but severe diverticulosis throughout the colon  Allergies  Allergen Reactions  . Tramadol Other (See Comments)    Dizziness    Current Meds  Medication Sig  . amLODipine (NORVASC) 5 MG tablet TAKE 1 TABLET BY MOUTH  DAILY  . aspirin 81 MG tablet Take 81 mg by mouth daily.  . betamethasone valerate ointment (VALISONE) 0.1 % Apply qod to affected area, small amount  . Blood Glucose Monitoring Suppl (ONETOUCH ULTRALINK) w/Device KIT Use to check blood sugars BID E11.69  . cholecalciferol (VITAMIN D) 1000 UNITS tablet Take 1,000 Units by mouth daily.    Marland Kitchen  dicyclomine (BENTYL) 10 MG capsule TAKE ONE CAPSULE BY MOUTH 4 TIMES DAILY AS NEEDED  . glucose blood (ONETOUCH ULTRA) test strip CHECK BLOOD SUGAR 3 TIMES  DAILY OR AS NEEDED.  . hydrALAZINE (APRESOLINE) 10 MG tablet Take 10 mg by mouth as needed.  . Lancets (ONETOUCH DELICA PLUS [id number]) MISC Use as directed 3 times a day.  DX code: E11.69  . losartan (COZAAR) 50 MG tablet TAKE 1 TABLET BY MOUTH  TWICE DAILY  . metFORMIN (GLUCOPHAGE-XR) 500 MG 24 hr tablet Take 1 tablet (500 mg total) by mouth in the morning and at bedtime.  . metoCLOPramide  (REGLAN) 5 MG tablet TAKE 1 TABLET BY MOUTH  TWICE DAILY  . metoprolol tartrate (LOPRESSOR) 50 MG tablet TAKE 1 AND 1/2 TABLETS BY  MOUTH TWICE DAILY  . NEXIUM 40 MG capsule Take 1 capsule (40 mg total) by mouth daily.  . ondansetron (ZOFRAN) 4 MG tablet Take 4 mg by mouth every 4 (four) hours as needed. For nausea    . potassium chloride (KLOR-CON) 10 MEQ tablet TAKE 1 TABLET BY MOUTH  DAILY  . rosuvastatin (CRESTOR) 20 MG tablet Take 1 tablet (20 mg total) by mouth daily.  . sodium chloride (OCEAN) 0.65 % SOLN nasal spray Place 1 spray into both nostrils as needed for congestion.  . thiamine 100 MG tablet Take 1 tablet (100 mg total) by mouth daily.   Past Medical History:  Diagnosis Date  . Abdominal pain in female 03/18/2010   Qualifier: Diagnosis of  By: Carlean Purl MD, Dimas Millin Anemia 06/08/2014  . Anxiety   . Arthritis    Spinal Osteoarthritis  . Cancer ([place])   . Carcinoid tumor of stomach   . Cataract   . Chest pain    Myoview 12/15 no ischemia.  . Chronic kidney disease    Left kidney smaller than right kidney  . Constipation 11/21/2016  . Diabetes mellitus type 2 in obese ([place]) 09/05/2006   Qualifier: Diagnosis of  By: Marca Ancona RMA, Lucy    . Diabetic peripheral neuropathy ([place]) 10/29/2013  . Encounter for preventative adult health care exam with abnormal findings 09/14/2013  . Esophageal reflux   . Gastric polyp    Fundic Gland  . Gastroparesis   . Headache(784.0)   . Heart murmur    Echocardiogram 2/11: EF 60-65%, mild LAE, grade 1 diastolic dysfunction, aortic valve sclerosis, mean gradient 9 mm of mercury, PASP 34  . Hematuria 03/16/2016  . Iron deficiency anemia, unspecified   . 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 Leg swelling    bilateral  . Neck pain 04/22/2015  . PONV (postoperative nausea and vomiting)    pt states only needs small amount of anesthesia  . PSVT (paroxysmal supraventricular tachycardia) ([place])   . Pure hypercholesterolemia   . 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    . Renal insufficiency 06/18/2019  . Stroke ([place])    tia, 2014  . 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 Type II or unspecified type diabetes mellitus without mention of complication, not stated as uncontrolled   . Unspecified essential hypertension   . Unspecified hereditary and idiopathic peripheral neuropathy 10/29/2013   Past Surgical History:  Procedure Laterality Date  . CHOLECYSTECTOMY  1993  . COLONOSCOPY  11/11/2010   diverticulosis  . DILATATION & CURRETTAGE/HYSTEROSCOPY WITH RESECTOCOPE N/A 02/25/2013   Procedure: Attempted hysteroscopy with uterine perforation;  Surgeon: Jamey Reas de Berton Lan, MD;  Location: [facility];  Service: Gynecology;  Laterality: N/A;  .  ESOPHAGOGASTRODUODENOSCOPY  08/29/2010; 09/15/2010   Carcinoid tumor less than 1 cm in July 2012 not seen in August 2012 , gastritis, fundic gland polyps  . ESOPHAGOGASTRODUODENOSCOPY  05/16/2011  . ESOPHAGOGASTRODUODENOSCOPY  06/14/2012  . EUS  12/15/2010   Procedure: UPPER ENDOSCOPIC ULTRASOUND (EUS) LINEAR;  Surgeon: Owens Loffler, MD;  Location: [facility];  Service: Endoscopy;  Laterality: N/A;  . EYE SURGERY Bilateral    Bi lateral cateracts and bi lateral laser  . LAPAROSCOPY N/A 02/25/2013   Procedure: Cystoscopy and laparoscopy with fulguration of uterine serosa;  Surgeon: Jamey Reas de Berton Lan, MD;  Location: [facility];  Service: Gynecology;  Laterality: N/A;  . 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   Patient is right-handed.   Patient has a BA degree.   One caffeine drink daily    family history includes Diabetes in her brother, maternal grandmother, mother, sister, and sister; Heart disease in her brother, brother, sister, sister, and sister; Hyperlipidemia in her brother, sister, and sister; Hypertension in her brother, paternal grandmother, sister, and sister; Stroke in her father.   Review of  Systems As per HPI  Objective:   Physical Exam BP 122/60   Pulse 64   Ht 5' 1" (1.549 m)   Wt 171 lb (77.6 kg)   LMP 02/07/1992   SpO2 98%   BMI 32.31 kg/m  Elderly Caucasian woman no acute distress Tongue has a tan coat but no ulcers in the oral cavity or on the tongue No signs of tardive dyskinesia

## 2019-12-01 ENCOUNTER — Other Ambulatory Visit (HOSPITAL_BASED_OUTPATIENT_CLINIC_OR_DEPARTMENT_OTHER): Payer: Medicare Other

## 2019-12-11 ENCOUNTER — Telehealth: Payer: Self-pay | Admitting: Cardiology

## 2019-12-11 NOTE — Telephone Encounter (Signed)
Patient would like to talk with Dr. Percival Spanish or his nurse. Please call back

## 2019-12-11 NOTE — Telephone Encounter (Signed)
Yes.  She can stop.

## 2019-12-11 NOTE — Telephone Encounter (Signed)
Returned call to pt she states that she saw on the news that "most doctors" are recommending that pt's stop their ASA 81mg . She is wondering if she should stop. She states that "some people in her family" they have stopped. Please advise

## 2019-12-12 NOTE — Telephone Encounter (Signed)
Pt informed of providers result & recommendations. Pt verbalized understanding. No further questions . ? ?

## 2019-12-31 ENCOUNTER — Telehealth: Payer: Self-pay

## 2019-12-31 NOTE — Telephone Encounter (Signed)
Returned pts call and she wished to r/s her cancelled 08/18/19 appts///// AOM

## 2020-01-06 ENCOUNTER — Ambulatory Visit: Payer: Medicare Other | Admitting: Medical

## 2020-01-08 ENCOUNTER — Telehealth: Payer: Self-pay | Admitting: Family Medicine

## 2020-01-08 ENCOUNTER — Other Ambulatory Visit: Payer: Self-pay | Admitting: Family Medicine

## 2020-01-08 NOTE — Progress Notes (Signed)
  Chronic Care Management   Note  01/08/2020 Name: [name] MRN: [id number] DOB: 1940/07/02  [name] is a 79 y.o. year old female who is a primary care patient of [name], MD. I reached out to [employer] by phone today in response to a referral sent by [name] PCP, [name], MD.   [name] was given information about Chronic Care Management services today including:  1. CCM service includes personalized support from designated clinical staff supervised by her physician, including individualized plan of care and coordination with other care providers 2. 24/7 contact phone numbers for assistance for urgent and routine care needs. 3. Service will only be billed when office clinical staff spend 20 minutes or more in a month to coordinate care. 4. Only one practitioner may furnish and bill the service in a calendar month. 5. The patient may stop CCM services at any time (effective at the end of the month) by phone call to the office staff.   Patient agreed to services and verbal consent obtained.   Follow up plan:   Carley Perdue [employer] Scheduler

## 2020-01-08 NOTE — Progress Notes (Signed)
  Chronic Care Management   Outreach Note  01/08/2020 Name: [name] MRN: [id number] DOB: 1941-01-25  Referred by: [name], MD Reason for referral : No chief complaint on file.   An unsuccessful telephone outreach was attempted today. The patient was referred to the pharmacist for assistance with care management and care coordination.   Follow Up Plan:   Carley Perdue [employer] Scheduler

## 2020-01-15 ENCOUNTER — Other Ambulatory Visit: Payer: Self-pay | Admitting: Family

## 2020-01-15 DIAGNOSIS — D508 Other iron deficiency anemias: Secondary | ICD-10-CM

## 2020-01-15 DIAGNOSIS — K909 Intestinal malabsorption, unspecified: Secondary | ICD-10-CM

## 2020-01-16 ENCOUNTER — Inpatient Hospital Stay (HOSPITAL_BASED_OUTPATIENT_CLINIC_OR_DEPARTMENT_OTHER): Payer: Medicare Other | Admitting: Family

## 2020-01-16 ENCOUNTER — Inpatient Hospital Stay: Payer: Medicare Other | Attending: Hematology & Oncology

## 2020-01-16 ENCOUNTER — Other Ambulatory Visit: Payer: Self-pay

## 2020-01-16 ENCOUNTER — Encounter: Payer: Self-pay | Admitting: Family

## 2020-01-16 VITALS — BP 126/58 | HR 64 | Temp 97.8°F | Resp 18 | Ht 65.0 in | Wt 169.1 lb

## 2020-01-16 DIAGNOSIS — K909 Intestinal malabsorption, unspecified: Secondary | ICD-10-CM

## 2020-01-16 DIAGNOSIS — D508 Other iron deficiency anemias: Secondary | ICD-10-CM

## 2020-01-16 DIAGNOSIS — Z79899 Other long term (current) drug therapy: Secondary | ICD-10-CM | POA: Insufficient documentation

## 2020-01-16 LAB — CBC WITH DIFFERENTIAL (CANCER CENTER ONLY)
Abs Immature Granulocytes: 0.09 10*3/uL — ABNORMAL HIGH (ref 0.00–0.07)
Basophils Absolute: 0.1 10*3/uL (ref 0.0–0.1)
Basophils Relative: 1 %
Eosinophils Absolute: 0.1 10*3/uL (ref 0.0–0.5)
Eosinophils Relative: 1 %
HCT: 37.5 % (ref 36.0–46.0)
Hemoglobin: 12.7 g/dL (ref 12.0–15.0)
Immature Granulocytes: 1 %
Lymphocytes Relative: 31 %
Lymphs Abs: 2.5 10*3/uL (ref 0.7–4.0)
MCH: 28.6 pg (ref 26.0–34.0)
MCHC: 33.9 g/dL (ref 30.0–36.0)
MCV: 84.5 fL (ref 80.0–100.0)
Monocytes Absolute: 0.7 10*3/uL (ref 0.1–1.0)
Monocytes Relative: 9 %
Neutro Abs: 4.6 10*3/uL (ref 1.7–7.7)
Neutrophils Relative %: 57 %
Platelet Count: 224 10*3/uL (ref 150–400)
RBC: 4.44 MIL/uL (ref 3.87–5.11)
RDW: 11.8 % (ref 11.5–15.5)
WBC Count: 8 10*3/uL (ref 4.0–10.5)
nRBC: 0 % (ref 0.0–0.2)

## 2020-01-16 LAB — RETICULOCYTES
Immature Retic Fract: 4.6 % (ref 2.3–15.9)
RBC.: 4.43 MIL/uL (ref 3.87–5.11)
Retic Count, Absolute: 73.1 10*3/uL (ref 19.0–186.0)
Retic Ct Pct: 1.7 % (ref 0.4–3.1)

## 2020-01-16 LAB — CMP (CANCER CENTER ONLY)
ALT: 22 U/L (ref 0–44)
AST: 19 U/L (ref 15–41)
Albumin: 4.7 g/dL (ref 3.5–5.0)
Alkaline Phosphatase: 55 U/L (ref 38–126)
Anion gap: 10 (ref 5–15)
BUN: 17 mg/dL (ref 8–23)
CO2: 24 mmol/L (ref 22–32)
Calcium: 10 mg/dL (ref 8.9–10.3)
Chloride: 93 mmol/L — ABNORMAL LOW (ref 98–111)
Creatinine: 0.8 mg/dL (ref 0.44–1.00)
GFR, Estimated: >60 mL/min (ref >60)
Glucose, Bld: 141 mg/dL — ABNORMAL HIGH (ref 70–99)
Potassium: 4.2 mmol/L (ref 3.5–5.1)
Sodium: 127 mmol/L — ABNORMAL LOW (ref 135–145)
Total Bilirubin: 0.6 mg/dL (ref 0.3–1.2)
Total Protein: 7.5 g/dL (ref 6.5–8.1)

## 2020-01-16 NOTE — Progress Notes (Signed)
Hematology and Oncology Follow Up Visit  [name] [id number] 14-Feb-1940 79 y.o. 01/16/2020   Principle Diagnosis:  Iron deficiency anemia secondary to malabsorption  Current Therapy:   IV iron as indicated    Interim History:  [name] is here today to re-establish care with our office. She has been feeling very fatigued, no energy and noted numbness and tingling around her tongue.  Her PCP has done lab work up including vitamin B 1, B 6, B 12 and TSH. B 1 was on the low end of normal so she has started taking a supplement.  Her sodium is low at 127 and chloride 93. We discussed this as the potential cause of her fatigue. I suggested she try drinking sugar free Gatoratde or Powerade. She verbalized understanding and will try doing half water and half sports drink.  No changes in speech or problems swallowing.  She has occasional episodes of dizziness.  No fever, chills, cough, rash, SOB, chest pain, palpitations, abdominal pain or changes in bowel or bladder habits.  She has occasional episodes of nausea not related to what she ate.  She has history of IBS that fluctuates between constipation and diarrhea. She notes occasional abdominal pain with this.  She has not noted any blood loss. No abnormal bruising, no petechiae.  She has peripheral neuropathy in her feet which is unchanged.  She notes stiffness in her legs that comes and goes.  No falls or syncope.  She states that she has some swelling in her feet at times due to Amlodipine.  She states that she has a good appetite and her weight is stable a 169 lbs.   ECOG Performance Status: 1 - Symptomatic but completely ambulatory  Medications:  Allergies as of 01/16/2020      Reactions   Tramadol Other (See Comments)   Dizziness      Medication List       Accurate as of January 16, 2020  3:08 PM. If you have any questions, ask your nurse or doctor.        amLODipine 5 MG tablet Commonly known as:  NORVASC TAKE 1 TABLET BY MOUTH  DAILY   betamethasone valerate ointment 0.1 % Commonly known as: VALISONE Apply qod to affected area, small amount   cholecalciferol 1000 units tablet Commonly known as: VITAMIN D Take 1,000 Units by mouth daily.   dicyclomine 10 MG capsule Commonly known as: BENTYL TAKE ONE CAPSULE BY MOUTH 4 TIMES DAILY AS NEEDED   famotidine 20 MG tablet Commonly known as: PEPCID Take 1 tablet (20 mg total) by mouth at bedtime as needed for heartburn or indigestion.   hydrALAZINE 10 MG tablet Commonly known as: APRESOLINE Take 10 mg by mouth as needed.   losartan 50 MG tablet Commonly known as: COZAAR TAKE 1 TABLET BY MOUTH  TWICE DAILY   metFORMIN 500 MG 24 hr tablet Commonly known as: GLUCOPHAGE-XR Take 1 tablet (500 mg total) by mouth in the morning and at bedtime.   metoCLOPramide 5 MG tablet Commonly known as: REGLAN TAKE 1 TABLET BY MOUTH  TWICE DAILY   metoprolol tartrate 50 MG tablet Commonly known as: LOPRESSOR TAKE 1 AND 1/2 TABLETS BY  MOUTH TWICE DAILY   NexIUM 40 MG capsule Generic drug: esomeprazole Take 1 capsule (40 mg total) by mouth daily.   ondansetron 4 MG tablet Commonly known as: ZOFRAN Take 4 mg by mouth every 4 (four) hours as needed. For nausea   OneTouch Delica Plus [id number]  Misc Use as directed 3 times a day.  DX code: E11.69   OneTouch Ultra test strip Generic drug: glucose blood CHECK BLOOD SUGAR 3 TIMES  DAILY OR AS NEEDED.   OneTouch UltraLink w/Device Kit Use to check blood sugars BID E11.69   potassium chloride 10 MEQ tablet Commonly known as: KLOR-CON TAKE 1 TABLET BY MOUTH  DAILY   rosuvastatin 20 MG tablet Commonly known as: Crestor Take 1 tablet (20 mg total) by mouth daily.   sodium chloride 0.65 % Soln nasal spray Commonly known as: OCEAN Place 1 spray into both nostrils as needed for congestion.   thiamine 100 MG tablet Take 1 tablet (100 mg total) by mouth daily.       Allergies:   Allergies  Allergen Reactions  . Tramadol Other (See Comments)    Dizziness     Past Medical History, Surgical history, Social history, and Family History were reviewed and updated.  Review of Systems: All other 10 point review of systems is negative.   Physical Exam:  vitals were not taken for this visit.   Wt Readings from Last 3 Encounters:  11/25/19 171 lb (77.6 kg)  11/18/19 170 lb 9.6 oz (77.4 kg)  11/13/19 170 lb (77.1 kg)    Ocular: Sclerae unicteric, pupils equal, round and reactive to light Ear-nose-throat: Oropharynx clear, dentition fair Lymphatic: No cervical or supraclavicular adenopathy Lungs no rales or rhonchi, good excursion bilaterally Heart regular rate and rhythm, no murmur appreciated Abd soft, nontender, positive bowel sounds MSK no focal spinal tenderness, no joint edema Neuro: non-focal, well-oriented, appropriate affect Breasts: Deferred   Lab Results  Component Value Date   WBC 8.0 01/16/2020   HGB 12.7 01/16/2020   HCT 37.5 01/16/2020   MCV 84.5 01/16/2020   PLT 224 01/16/2020   Lab Results  Component Value Date   FERRITIN 343 (H) 12/26/2017   IRON 69 08/08/2019   TIBC 285 12/26/2017   UIBC 187 12/26/2017   IRONPCTSAT 34 12/26/2017   Lab Results  Component Value Date   RETICCTPCT 1.7 01/16/2020   RBC 4.44 01/16/2020   RBC 4.43 01/16/2020   RETICCTABS 39.8 01/22/2015   No results found for: [facility] Lab Results  Component Value Date   IGA 228 06/06/2011   No results found for: Kathrynn Ducking, MSPIKE, SPEI   Chemistry      Component Value Date/Time   NA 136 11/13/2019 0906   NA 136 05/08/2016 1305   NA 134 (L) 07/08/2015 1149   K 4.1 11/13/2019 0906   K 4.2 05/08/2016 1305   K 4.4 07/08/2015 1149   CL 99 11/13/2019 0906   CL 102 05/08/2016 1305   CO2 24 11/13/2019 0906   CO2 26 05/08/2016 1305   CO2 25 07/08/2015 1149   BUN 19 11/13/2019 0906    BUN 16 05/08/2016 1305   BUN 15.5 07/08/2015 1149   CREATININE 0.78 11/13/2019 0906   CREATININE 0.8 07/08/2015 1149      Component Value Date/Time   CALCIUM 10.0 11/13/2019 0906   CALCIUM 9.4 05/08/2016 1305   CALCIUM 10.0 07/08/2015 1149   ALKPHOS 58 08/08/2019 1157   ALKPHOS 56 05/08/2016 1305   ALKPHOS 62 07/08/2015 1149   AST 19 11/13/2019 0906   AST 26 12/26/2017 1000   AST 24 07/08/2015 1149   ALT 21 11/13/2019 0906   ALT 28 12/26/2017 1000   ALT 29 05/08/2016 1305   ALT 30 07/08/2015 1149  BILITOT 0.8 11/13/2019 0906   BILITOT 0.6 12/26/2017 1000   BILITOT 0.64 07/08/2015 1149       Impression and Plan: [name] is a very pleasant 79 yo female with history of iron deficiency anemia secondary to malabsorption. She is having fatigue and numbness and tingling in her tongue and around her mouth. As discussed above she will try increasing her hydration with water and a sugar free sports drink daily.  Iron studies are pending. We will replace if needed.  Follow-up in 2 months.  She can contact our office with any questions or concerns.   Laverna Peace, NP 12/10/20213:08 PM

## 2020-01-19 ENCOUNTER — Telehealth: Payer: Self-pay | Admitting: *Deleted

## 2020-01-19 ENCOUNTER — Other Ambulatory Visit: Payer: Self-pay | Admitting: Family

## 2020-01-19 ENCOUNTER — Telehealth: Payer: Self-pay | Admitting: Family

## 2020-01-19 LAB — IRON AND TIBC
Iron: 60 ug/dL (ref 41–142)
Saturation Ratios: 17 % — ABNORMAL LOW (ref 21–57)
TIBC: 354 ug/dL (ref 236–444)
UIBC: 294 ug/dL (ref 120–384)

## 2020-01-19 LAB — FERRITIN: Ferritin: 191 ng/mL (ref 11–307)

## 2020-01-19 NOTE — Telephone Encounter (Signed)
Called and advised patient of appointments and she would like to speak with nurse regarding why she needs these appointments. IB message send to charge nurse to call patient per 12/13 sch msg

## 2020-01-19 NOTE — Telephone Encounter (Signed)
I called patient to inform her that she needed three doses of IV iron per Sarah [place],NP. Patient wants to think about it, and she will call the office back if she wants to get iron. I reviewed her labs with her. She verbalized understanding of needing IV iron.

## 2020-01-20 ENCOUNTER — Telehealth: Payer: Self-pay

## 2020-01-20 NOTE — Telephone Encounter (Signed)
Pt returned the call regarding her iron tx and she is aware of her appts starting next week..... AOM

## 2020-01-26 ENCOUNTER — Other Ambulatory Visit: Payer: Self-pay

## 2020-01-26 ENCOUNTER — Inpatient Hospital Stay: Payer: Medicare Other

## 2020-01-26 VITALS — BP 128/55 | HR 65 | Temp 98.2°F | Resp 18

## 2020-01-26 DIAGNOSIS — K909 Intestinal malabsorption, unspecified: Secondary | ICD-10-CM | POA: Diagnosis not present

## 2020-01-26 DIAGNOSIS — D500 Iron deficiency anemia secondary to blood loss (chronic): Secondary | ICD-10-CM

## 2020-01-26 DIAGNOSIS — D508 Other iron deficiency anemias: Secondary | ICD-10-CM | POA: Diagnosis not present

## 2020-01-26 DIAGNOSIS — Z79899 Other long term (current) drug therapy: Secondary | ICD-10-CM | POA: Diagnosis not present

## 2020-01-26 MED ORDER — SODIUM CHLORIDE 0.9 % IV SOLN
Freq: Once | INTRAVENOUS | Status: AC
  Filled 2020-01-26: qty 250

## 2020-01-26 MED ORDER — SODIUM CHLORIDE 0.9 % IV SOLN
200.0000 mg | Freq: Once | INTRAVENOUS | Status: AC
  Administered 2020-01-26: 14:00:00 200 mg via INTRAVENOUS
  Filled 2020-01-26: qty 200

## 2020-01-26 NOTE — Patient Instructions (Addendum)

## 2020-01-29 ENCOUNTER — Other Ambulatory Visit: Payer: Self-pay

## 2020-01-29 ENCOUNTER — Inpatient Hospital Stay: Payer: Medicare Other

## 2020-01-29 VITALS — BP 141/51 | HR 64 | Temp 97.8°F | Resp 18

## 2020-01-29 DIAGNOSIS — D500 Iron deficiency anemia secondary to blood loss (chronic): Secondary | ICD-10-CM

## 2020-01-29 DIAGNOSIS — Z79899 Other long term (current) drug therapy: Secondary | ICD-10-CM | POA: Diagnosis not present

## 2020-01-29 DIAGNOSIS — K909 Intestinal malabsorption, unspecified: Secondary | ICD-10-CM

## 2020-01-29 DIAGNOSIS — D508 Other iron deficiency anemias: Secondary | ICD-10-CM | POA: Diagnosis not present

## 2020-01-29 MED ORDER — SODIUM CHLORIDE 0.9 % IV SOLN
200.0000 mg | Freq: Once | INTRAVENOUS | Status: AC
  Administered 2020-01-29: 14:00:00 200 mg via INTRAVENOUS
  Filled 2020-01-29: qty 200

## 2020-01-29 MED ORDER — SODIUM CHLORIDE 0.9 % IV SOLN
Freq: Once | INTRAVENOUS | Status: AC
  Filled 2020-01-29: qty 250

## 2020-01-29 NOTE — Patient Instructions (Signed)
Iron Sucrose injection (Venofer)  What is this medicine? IRON SUCROSE (AHY ern SOO krohs) is an iron complex. Iron is used to make healthy red blood cells, which carry oxygen and nutrients throughout the body. This medicine is used to treat iron deficiency anemia in people with chronic kidney disease. This medicine may be used for other purposes; ask your health care provider or pharmacist if you have questions. COMMON BRAND NAME(S): Venofer What should I tell my health care provider before I take this medicine? They need to know if you have any of these conditions:  anemia not caused by low iron levels  heart disease  high levels of iron in the blood  kidney disease  liver disease  an unusual or allergic reaction to iron, other medicines, foods, dyes, or preservatives  pregnant or trying to get pregnant  breast-feeding How should I use this medicine? This medicine is for infusion into a vein. It is given by a health care professional in a hospital or clinic setting. Talk to your pediatrician regarding the use of this medicine in children. While this drug may be prescribed for children as young as 2 years for selected conditions, precautions do apply. Overdosage: If you think you have taken too much of this medicine contact a poison control center or emergency room at once. NOTE: This medicine is only for you. Do not share this medicine with others. What if I miss a dose? It is important not to miss your dose. Call your doctor or health care professional if you are unable to keep an appointment. What may interact with this medicine? Do not take this medicine with any of the following medications:  deferoxamine  dimercaprol  other iron products This medicine may also interact with the following medications:  chloramphenicol  deferasirox This list may not describe all possible interactions. Give your health care provider a list of all the medicines, herbs, non-prescription  drugs, or dietary supplements you use. Also tell them if you smoke, drink alcohol, or use illegal drugs. Some items may interact with your medicine. What should I watch for while using this medicine? Visit your doctor or healthcare professional regularly. Tell your doctor or healthcare professional if your symptoms do not start to get better or if they get worse. You may need blood work done while you are taking this medicine. You may need to follow a special diet. Talk to your doctor. Foods that contain iron include: whole grains/cereals, dried fruits, beans, or peas, leafy green vegetables, and organ meats (liver, kidney). What side effects may I notice from receiving this medicine? Side effects that you should report to your doctor or health care professional as soon as possible:  allergic reactions like skin rash, itching or hives, swelling of the face, lips, or tongue  breathing problems  changes in blood pressure  cough  fast, irregular heartbeat  feeling faint or lightheaded, falls  fever or chills  flushing, sweating, or hot feelings  joint or muscle aches/pains  seizures  swelling of the ankles or feet  unusually weak or tired Side effects that usually do not require medical attention (report to your doctor or health care professional if they continue or are bothersome):  diarrhea  feeling achy  headache  irritation at site where injected  nausea, vomiting  stomach upset  tiredness This list may not describe all possible side effects. Call your doctor for medical advice about side effects. You may report side effects to FDA at 1-800-FDA-1088. Where should   I keep my medicine? This drug is given in a hospital or clinic and will not be stored at home. NOTE: This sheet is a summary. It may not cover all possible information. If you have questions about this medicine, talk to your doctor, pharmacist, or health care provider.  2020 Elsevier/Gold Standard  (2010-11-03 17:14:35)  

## 2020-02-02 ENCOUNTER — Inpatient Hospital Stay: Payer: Medicare Other

## 2020-02-02 ENCOUNTER — Other Ambulatory Visit: Payer: Self-pay

## 2020-02-02 VITALS — BP 125/45 | HR 59 | Temp 97.4°F | Resp 16

## 2020-02-02 DIAGNOSIS — D508 Other iron deficiency anemias: Secondary | ICD-10-CM | POA: Diagnosis not present

## 2020-02-02 DIAGNOSIS — Z79899 Other long term (current) drug therapy: Secondary | ICD-10-CM | POA: Diagnosis not present

## 2020-02-02 DIAGNOSIS — K909 Intestinal malabsorption, unspecified: Secondary | ICD-10-CM

## 2020-02-02 DIAGNOSIS — D500 Iron deficiency anemia secondary to blood loss (chronic): Secondary | ICD-10-CM

## 2020-02-02 MED ORDER — SODIUM CHLORIDE 0.9 % IV SOLN
Freq: Once | INTRAVENOUS | Status: AC
  Filled 2020-02-02: qty 250

## 2020-02-02 MED ORDER — SODIUM CHLORIDE 0.9 % IV SOLN
200.0000 mg | Freq: Once | INTRAVENOUS | Status: AC
  Administered 2020-02-02: 14:00:00 200 mg via INTRAVENOUS
  Filled 2020-02-02: qty 200

## 2020-02-02 NOTE — Progress Notes (Signed)
Pt discharged in no apparent distress. Pt left ambulatory without assistance. Pt aware of discharge instructions and verbalized understanding and had no further questions.  

## 2020-02-02 NOTE — Patient Instructions (Addendum)

## 2020-02-03 ENCOUNTER — Other Ambulatory Visit: Payer: Self-pay | Admitting: Internal Medicine

## 2020-02-18 ENCOUNTER — Other Ambulatory Visit: Payer: Medicare Other

## 2020-02-19 ENCOUNTER — Telehealth: Payer: Self-pay | Admitting: Family Medicine

## 2020-02-19 NOTE — Progress Notes (Signed)
  Chronic Care Management   Outreach Note  02/19/2020 Name: SHUNTE SENSENEY MRN: [id number] DOB: December 04, 1940  Referred by: [name], MD Reason for referral : No chief complaint on file.   An unsuccessful telephone outreach was attempted today. The patient was referred to the pharmacist for assistance with care management and care coordination.   Follow Up Plan:   Carley Perdue [employer] Scheduler

## 2020-03-08 ENCOUNTER — Telehealth: Payer: Self-pay

## 2020-03-08 NOTE — Telephone Encounter (Signed)
Called pt to r/s her 03/12/20 appts to 03/19/20 due to sarah being out of the office   Rahim Astorga

## 2020-03-09 ENCOUNTER — Telehealth: Payer: Self-pay | Admitting: Family Medicine

## 2020-03-09 NOTE — Progress Notes (Signed)
  Chronic Care Management   Outreach Note  03/09/2020 Name: MAKYNA NIEHOFF MRN: [id number] DOB: 1940/10/09  Referred by: [name], MD Reason for referral : No chief complaint on file.   A second unsuccessful telephone outreach was attempted today. The patient was referred to pharmacist for assistance with care management and care coordination.  Follow Up Plan:   Carley Perdue [employer] Scheduler

## 2020-03-12 ENCOUNTER — Inpatient Hospital Stay: Payer: Medicare Other | Admitting: Family

## 2020-03-12 ENCOUNTER — Inpatient Hospital Stay: Payer: Medicare Other

## 2020-03-17 ENCOUNTER — Telehealth: Payer: Self-pay | Admitting: Family Medicine

## 2020-03-17 NOTE — Progress Notes (Signed)
  Chronic Care Management   Outreach Note  03/17/2020 Name: [name] MRN: [id number] DOB: 1940/05/19  Referred by: [name], MD Reason for referral : No chief complaint on file.   Third unsuccessful telephone outreach was attempted today. The patient was referred to the pharmacist for assistance with care management and care coordination.   Follow Up Plan:   Carley Perdue [employer] Scheduler

## 2020-03-19 ENCOUNTER — Inpatient Hospital Stay: Payer: Medicare Other | Attending: Hematology & Oncology

## 2020-03-19 ENCOUNTER — Other Ambulatory Visit: Payer: Self-pay

## 2020-03-19 ENCOUNTER — Encounter: Payer: Self-pay | Admitting: Family

## 2020-03-19 ENCOUNTER — Inpatient Hospital Stay (HOSPITAL_BASED_OUTPATIENT_CLINIC_OR_DEPARTMENT_OTHER): Payer: Medicare Other | Admitting: Family

## 2020-03-19 VITALS — BP 112/51 | HR 65 | Temp 97.9°F | Resp 18 | Ht 61.0 in | Wt 169.0 lb

## 2020-03-19 DIAGNOSIS — K580 Irritable bowel syndrome with diarrhea: Secondary | ICD-10-CM | POA: Diagnosis not present

## 2020-03-19 DIAGNOSIS — D508 Other iron deficiency anemias: Secondary | ICD-10-CM

## 2020-03-19 DIAGNOSIS — K909 Intestinal malabsorption, unspecified: Secondary | ICD-10-CM | POA: Diagnosis not present

## 2020-03-19 DIAGNOSIS — Z79899 Other long term (current) drug therapy: Secondary | ICD-10-CM | POA: Diagnosis not present

## 2020-03-19 DIAGNOSIS — D500 Iron deficiency anemia secondary to blood loss (chronic): Secondary | ICD-10-CM

## 2020-03-19 LAB — CBC WITH DIFFERENTIAL (CANCER CENTER ONLY)
Abs Immature Granulocytes: 0.02 10*3/uL (ref 0.00–0.07)
Basophils Absolute: 0.1 10*3/uL (ref 0.0–0.1)
Basophils Relative: 1 %
Eosinophils Absolute: 0.1 10*3/uL (ref 0.0–0.5)
Eosinophils Relative: 1 %
HCT: 37.4 % (ref 36.0–46.0)
Hemoglobin: 12.7 g/dL (ref 12.0–15.0)
Immature Granulocytes: 0 %
Lymphocytes Relative: 31 %
Lymphs Abs: 2.9 10*3/uL (ref 0.7–4.0)
MCH: 29.3 pg (ref 26.0–34.0)
MCHC: 34 g/dL (ref 30.0–36.0)
MCV: 86.2 fL (ref 80.0–100.0)
Monocytes Absolute: 0.8 10*3/uL (ref 0.1–1.0)
Monocytes Relative: 9 %
Neutro Abs: 5.7 10*3/uL (ref 1.7–7.7)
Neutrophils Relative %: 58 %
Platelet Count: 203 10*3/uL (ref 150–400)
RBC: 4.34 MIL/uL (ref 3.87–5.11)
RDW: 11.9 % (ref 11.5–15.5)
WBC Count: 9.6 10*3/uL (ref 4.0–10.5)
nRBC: 0 % (ref 0.0–0.2)

## 2020-03-19 LAB — CMP (CANCER CENTER ONLY)
ALT: 18 U/L (ref 0–44)
AST: 14 U/L — ABNORMAL LOW (ref 15–41)
Albumin: 4.5 g/dL (ref 3.5–5.0)
Alkaline Phosphatase: 56 U/L (ref 38–126)
Anion gap: 8 (ref 5–15)
BUN: 19 mg/dL (ref 8–23)
CO2: 27 mmol/L (ref 22–32)
Calcium: 10 mg/dL (ref 8.9–10.3)
Chloride: 96 mmol/L — ABNORMAL LOW (ref 98–111)
Creatinine: 0.81 mg/dL (ref 0.44–1.00)
GFR, Estimated: >60 mL/min (ref >60)
Glucose, Bld: 128 mg/dL — ABNORMAL HIGH (ref 70–99)
Potassium: 4.1 mmol/L (ref 3.5–5.1)
Sodium: 131 mmol/L — ABNORMAL LOW (ref 135–145)
Total Bilirubin: 0.8 mg/dL (ref 0.3–1.2)
Total Protein: 6.9 g/dL (ref 6.5–8.1)

## 2020-03-19 LAB — RETICULOCYTES
Immature Retic Fract: 4.7 % (ref 2.3–15.9)
RBC.: 4.35 MIL/uL (ref 3.87–5.11)
Retic Count, Absolute: 67.4 10*3/uL (ref 19.0–186.0)
Retic Ct Pct: 1.6 % (ref 0.4–3.1)

## 2020-03-19 NOTE — Progress Notes (Signed)
Hematology and Oncology Follow Up Visit  ITALIA WOLFERT [id number] 1940-06-22 80 y.o. 03/19/2020   Principle Diagnosis:  Iron deficiency anemia secondary to malabsorption  Current Therapy:        IV iron as indicated    Interim History:  Ms. Kercher is here today for follow-up. She is feeling much better since receiving the IV iron. The numbness and tingling has resolved and her energy is improving.  She still has some fatigue at times. She states that sometimes she does not sleep well.  No blood loss noted. No bruising or petechiae.  She has had no fever, chills, n/v, cough, rash, SOB, chest pain, palpitations, abdominal pain or changes in bowel or bladder habits.  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  She has occasional episodes of dizziness which she attributes to her blood sugars or blood pressure.  She has occasional puffiness in her feet and ankles by the end of the day and resolves over night.  She has pain in the left side of her head at times and states that she has been told to use heat and take Tylenol as needed which does help some. She will follow-up with PCP if this persists or worsens.  She has maintained a good appetite and is doing her best to stay well hydrated. Her weight is stable at 163 lbs.    ECOG Performance Status: 1 - Symptomatic but completely ambulatory  Medications:  Allergies as of 03/19/2020      Reactions   Tramadol Other (See Comments)   Dizziness      Medication List       Accurate as of March 19, 2020  1:34 PM. If you have any questions, ask your nurse or doctor.        amLODipine 5 MG tablet Commonly known as: NORVASC TAKE 1 TABLET BY MOUTH  DAILY   betamethasone valerate ointment 0.1 % Commonly known as: VALISONE Apply qod to affected area, small amount   cholecalciferol 1000 units tablet Commonly known as: VITAMIN D Take 1,000 Units by mouth daily.   dicyclomine 10 MG  capsule Commonly known as: BENTYL TAKE ONE CAPSULE BY MOUTH 4 TIMES DAILY AS NEEDED   famotidine 20 MG tablet Commonly known as: PEPCID Take 1 tablet (20 mg total) by mouth at bedtime as needed for heartburn or indigestion.   hydrALAZINE 10 MG tablet Commonly known as: APRESOLINE Take 10 mg by mouth as needed.   losartan 50 MG tablet Commonly known as: COZAAR TAKE 1 TABLET BY MOUTH  TWICE DAILY   metFORMIN 500 MG 24 hr tablet Commonly known as: GLUCOPHAGE-XR Take 1 tablet (500 mg total) by mouth in the morning and at bedtime.   metoCLOPramide 5 MG tablet Commonly known as: REGLAN TAKE 1 TABLET BY MOUTH  TWICE DAILY   metoprolol tartrate 50 MG tablet Commonly known as: LOPRESSOR TAKE 1 AND 1/2 TABLETS BY  MOUTH TWICE DAILY   NexIUM 40 MG capsule Generic drug: esomeprazole Take 1 capsule (40 mg total) by mouth daily.   ondansetron 4 MG tablet Commonly known as: ZOFRAN Take 4 mg by mouth every 4 (four) hours as needed. For nausea   OneTouch Delica Plus [id number] Misc Use as directed 3 times a day.  DX code: E11.69   OneTouch Ultra test strip Generic drug: glucose blood CHECK BLOOD SUGAR 3 TIMES  DAILY OR AS NEEDED.   OneTouch UltraLink w/Device Kit Use to check blood  sugars BID E11.69   potassium chloride 10 MEQ tablet Commonly known as: KLOR-CON TAKE 1 TABLET BY MOUTH  DAILY   rosuvastatin 20 MG tablet Commonly known as: Crestor Take 1 tablet (20 mg total) by mouth daily.   sodium chloride 0.65 % Soln nasal spray Commonly known as: OCEAN Place 1 spray into both nostrils as needed for congestion.   thiamine 100 MG tablet Take 1 tablet (100 mg total) by mouth daily.       Allergies:  Allergies  Allergen Reactions  . Tramadol Other (See Comments)    Dizziness     Past Medical History, Surgical history, Social history, and Family History were reviewed and updated.  Review of Systems: All other 10 point review of systems is negative.   Physical  Exam:  vitals were not taken for this visit.   Wt Readings from Last 3 Encounters:  01/16/20 169 lb 1.9 oz (76.7 kg)  11/25/19 171 lb (77.6 kg)  11/18/19 170 lb 9.6 oz (77.4 kg)    Ocular: Sclerae unicteric, pupils equal, round and reactive to light Ear-nose-throat: Oropharynx clear, dentition fair Lymphatic: No cervical or supraclavicular adenopathy Lungs no rales or rhonchi, good excursion bilaterally Heart regular rate and rhythm, no murmur appreciated Abd soft, nontender, positive bowel sounds MSK no focal spinal tenderness, no joint edema Neuro: non-focal, well-oriented, appropriate affect Breasts: Deferred   Lab Results  Component Value Date   WBC 9.6 03/19/2020   HGB 12.7 03/19/2020   HCT 37.4 03/19/2020   MCV 86.2 03/19/2020   PLT 203 03/19/2020   Lab Results  Component Value Date   FERRITIN 191 01/16/2020   IRON 60 01/16/2020   TIBC 354 01/16/2020   UIBC 294 01/16/2020   IRONPCTSAT 17 (L) 01/16/2020   Lab Results  Component Value Date   RETICCTPCT 1.6 03/19/2020   RBC 4.34 03/19/2020   RBC 4.35 03/19/2020   RETICCTABS 39.8 01/22/2015   No results found for: Nils Pyle [facility] Lab Results  Component Value Date   IGA 228 06/06/2011   No results found for: Kathrynn Ducking, MSPIKE, SPEI   Chemistry      Component Value Date/Time   NA 127 (L) 01/16/2020 1444   NA 136 05/08/2016 1305   NA 134 (L) 07/08/2015 1149   K 4.2 01/16/2020 1444   K 4.2 05/08/2016 1305   K 4.4 07/08/2015 1149   CL 93 (L) 01/16/2020 1444   CL 102 05/08/2016 1305   CO2 24 01/16/2020 1444   CO2 26 05/08/2016 1305   CO2 25 07/08/2015 1149   BUN 17 01/16/2020 1444   BUN 16 05/08/2016 1305   BUN 15.5 07/08/2015 1149   CREATININE 0.80 01/16/2020 1444   CREATININE 0.78 11/13/2019 0906   CREATININE 0.8 07/08/2015 1149      Component Value Date/Time   CALCIUM 10.0 01/16/2020 1444   CALCIUM 9.4 05/08/2016 1305    CALCIUM 10.0 07/08/2015 1149   ALKPHOS 55 01/16/2020 1444   ALKPHOS 56 05/08/2016 1305   ALKPHOS 62 07/08/2015 1149   AST 19 01/16/2020 1444   AST 24 07/08/2015 1149   ALT 22 01/16/2020 1444   ALT 29 05/08/2016 1305   ALT 30 07/08/2015 1149   BILITOT 0.6 01/16/2020 1444   BILITOT 0.64 07/08/2015 1149       Impression and Plan: Ms. Vanderzanden is a very pleasant 80 yo female with history of iron deficiency anemia secondary to malabsorption. She seems to have responded  nicely to the IV iron.  Iron studies are pending. We will replace if needed.  Follow-up in 3 months.  She can contact our office with any questions or concerns.   Laverna Peace, NP 2/11/20221:34 PM

## 2020-03-19 NOTE — Telephone Encounter (Signed)
Called patient and lvm of upcoming appts. - mail calendar - view mychart

## 2020-03-22 ENCOUNTER — Other Ambulatory Visit (HOSPITAL_BASED_OUTPATIENT_CLINIC_OR_DEPARTMENT_OTHER): Payer: Medicare Other

## 2020-03-22 ENCOUNTER — Other Ambulatory Visit: Payer: Medicare Other

## 2020-03-22 ENCOUNTER — Ambulatory Visit (HOSPITAL_BASED_OUTPATIENT_CLINIC_OR_DEPARTMENT_OTHER): Payer: Medicare Other

## 2020-03-22 LAB — IRON AND TIBC
Iron: 50 ug/dL (ref 41–142)
Saturation Ratios: 19 % — ABNORMAL LOW (ref 21–57)
TIBC: 271 ug/dL (ref 236–444)
UIBC: 221 ug/dL (ref 120–384)

## 2020-03-22 LAB — FERRITIN: Ferritin: 379 ng/mL — ABNORMAL HIGH (ref 11–307)

## 2020-03-25 ENCOUNTER — Ambulatory Visit (INDEPENDENT_AMBULATORY_CARE_PROVIDER_SITE_OTHER): Payer: Medicare Other | Admitting: Family Medicine

## 2020-03-25 ENCOUNTER — Telehealth: Payer: Self-pay

## 2020-03-25 ENCOUNTER — Other Ambulatory Visit: Payer: Self-pay

## 2020-03-25 ENCOUNTER — Encounter: Payer: Self-pay | Admitting: Family Medicine

## 2020-03-25 ENCOUNTER — Other Ambulatory Visit: Payer: Self-pay | Admitting: Family Medicine

## 2020-03-25 ENCOUNTER — Ambulatory Visit: Payer: Medicare Other

## 2020-03-25 VITALS — BP 126/68 | HR 65 | Temp 98.4°F | Resp 16 | Wt 169.0 lb

## 2020-03-25 DIAGNOSIS — M549 Dorsalgia, unspecified: Secondary | ICD-10-CM

## 2020-03-25 DIAGNOSIS — E669 Obesity, unspecified: Secondary | ICD-10-CM | POA: Diagnosis not present

## 2020-03-25 DIAGNOSIS — Z7289 Other problems related to lifestyle: Secondary | ICD-10-CM

## 2020-03-25 DIAGNOSIS — I10 Essential (primary) hypertension: Secondary | ICD-10-CM

## 2020-03-25 DIAGNOSIS — Z1239 Encounter for other screening for malignant neoplasm of breast: Secondary | ICD-10-CM

## 2020-03-25 DIAGNOSIS — D538 Other specified nutritional anemias: Secondary | ICD-10-CM

## 2020-03-25 DIAGNOSIS — E559 Vitamin D deficiency, unspecified: Secondary | ICD-10-CM | POA: Diagnosis not present

## 2020-03-25 DIAGNOSIS — H5712 Ocular pain, left eye: Secondary | ICD-10-CM | POA: Diagnosis not present

## 2020-03-25 DIAGNOSIS — E7800 Pure hypercholesterolemia, unspecified: Secondary | ICD-10-CM | POA: Diagnosis not present

## 2020-03-25 DIAGNOSIS — R109 Unspecified abdominal pain: Secondary | ICD-10-CM | POA: Diagnosis not present

## 2020-03-25 DIAGNOSIS — G8929 Other chronic pain: Secondary | ICD-10-CM

## 2020-03-25 DIAGNOSIS — N289 Disorder of kidney and ureter, unspecified: Secondary | ICD-10-CM

## 2020-03-25 DIAGNOSIS — R351 Nocturia: Secondary | ICD-10-CM

## 2020-03-25 DIAGNOSIS — E1169 Type 2 diabetes mellitus with other specified complication: Secondary | ICD-10-CM

## 2020-03-25 DIAGNOSIS — R519 Headache, unspecified: Secondary | ICD-10-CM

## 2020-03-25 DIAGNOSIS — K909 Intestinal malabsorption, unspecified: Secondary | ICD-10-CM

## 2020-03-25 LAB — COMPREHENSIVE METABOLIC PANEL
ALT: 19 U/L (ref 0–35)
AST: 18 U/L (ref 0–37)
Albumin: 4.5 g/dL (ref 3.5–5.2)
Alkaline Phosphatase: 53 U/L (ref 39–117)
BUN: 21 mg/dL (ref 6–23)
CO2: 30 mEq/L (ref 19–32)
Calcium: 10.4 mg/dL (ref 8.4–10.5)
Chloride: 96 mEq/L (ref 96–112)
Creatinine, Ser: 0.78 mg/dL (ref 0.40–1.20)
GFR: 72.46 mL/min (ref >60.00)
Glucose, Bld: 111 mg/dL — ABNORMAL HIGH (ref 70–99)
Potassium: 4.6 mEq/L (ref 3.5–5.1)
Sodium: 132 mEq/L — ABNORMAL LOW (ref 135–145)
Total Bilirubin: 0.6 mg/dL (ref 0.2–1.2)
Total Protein: 7.2 g/dL (ref 6.0–8.3)

## 2020-03-25 LAB — LIPID PANEL
Cholesterol: 185 mg/dL (ref 0–200)
HDL: 61.7 mg/dL (ref >39.00)
NonHDL: 123.03
Total CHOL/HDL Ratio: 3
Triglycerides: 207 mg/dL — ABNORMAL HIGH (ref 0.0–149.0)
VLDL: 41.4 mg/dL — ABNORMAL HIGH (ref 0.0–40.0)

## 2020-03-25 LAB — HEMOGLOBIN A1C: Hgb A1c MFr Bld: 6.4 % (ref 4.6–6.5)

## 2020-03-25 LAB — URINALYSIS
Bilirubin Urine: NEGATIVE
Ketones, ur: NEGATIVE
Leukocytes,Ua: NEGATIVE
Nitrite: NEGATIVE
Specific Gravity, Urine: <=1.005 — AB (ref 1.000–1.030)
Total Protein, Urine: NEGATIVE
Urine Glucose: NEGATIVE
Urobilinogen, UA: 0.2 (ref 0.0–1.0)
pH: 7 (ref 5.0–8.0)

## 2020-03-25 LAB — SEDIMENTATION RATE: Sed Rate: 12 mm/hr (ref 0–30)

## 2020-03-25 LAB — TSH: TSH: 1.87 u[IU]/mL (ref 0.35–4.50)

## 2020-03-25 LAB — LDL CHOLESTEROL, DIRECT: Direct LDL: 95 mg/dL

## 2020-03-25 MED ORDER — METFORMIN HCL ER 500 MG PO TB24: 500.0000 mg | ORAL_TABLET | Freq: Every day | ORAL | 1 refills | Status: DC

## 2020-03-25 MED ORDER — ESOMEPRAZOLE MAGNESIUM 20 MG PO CPDR: 20.0000 mg | DELAYED_RELEASE_CAPSULE | Freq: Every day | ORAL | Status: DC

## 2020-03-25 NOTE — Telephone Encounter (Signed)
Scheduling attempted to all patient to set up iron, patient declined wanting to get iron infusions. As she wanted to know what her results are. Called patient and left message that her lab results did indicate she needed iron infusions to call back and schedule at her convienence.

## 2020-03-25 NOTE — Patient Instructions (Addendum)
Keep your eye appointment   Trigeminal Neuralgia  Trigeminal neuralgia is a nerve disorder that causes severe pain on one side of the face. The pain may last from a few seconds to several minutes. The pain is usually only on one side of the face. Symptoms may occur for days, weeks, or months and then go away for months or years. The pain may return and be worse than before. What are the causes? This condition is caused by damage or pressure to a nerve in the head that is called the trigeminal nerve. An attack can be triggered by:  Talking.  Chewing.  Putting on makeup.  Washing your face.  Shaving your face.  Brushing your teeth.  Touching your face. What increases the risk? You are more likely to develop this condition if you:  Are 22 years of age or older.  Are female. What are the signs or symptoms? The main symptom of this condition is severe pain in the:  Jaw.  Lips.  Eyes.  Nose.  Scalp.  Forehead.  Face. The pain may be:  Intense.  Stabbing.  Electric.  Shock-like. How is this diagnosed? This condition is diagnosed with a physical exam. A CT scan or an MRI may be done to rule out other conditions that can cause facial pain. How is this treated? This condition may be treated with:  Avoiding the things that trigger your symptoms.  Taking prescription medicines (anticonvulsants).  Having surgery. This may be done in severe cases if other medical treatment does not provide relief.  Having procedures such as ablation, thermal, or radiation therapy. It may take up to one month for treatment to start relieving the pain. Follow these instructions at home: Managing pain  Learn as much as you can about how to manage your pain. Ask your health care provider if a pain specialist would be helpful.  Consider talking with a mental health care provider (psychologist) about how to cope with the pain.  Consider joining a pain support group. General  instructions  Take over-the-counter and prescription medicines only as told by your health care provider.  Avoid the things that trigger your symptoms. It may help to: ? Chew on the unaffected side of your mouth. ? Avoid touching your face. ? Avoid blasts of hot or cold air.  Follow your treatment plan as told by your health care provider. This may include: ? Cognitive or behavioral therapy. ? Gentle, regular exercise. ? Meditation or yoga. ? Aromatherapy.  Keep all follow-up visits as told by your health care provider. You may need to be monitored closely to make sure treatment is working well for you. Where to find more information  F[employer]: fpa-support.org Contact a health care provider if:  Your medicine is not helping your symptoms.  You have side effects from the medicine used for treatment.  You develop new, unexplained symptoms, such as: ? Double vision. ? Facial weakness. ? Facial numbness. ? Changes in hearing or balance.  You feel depressed. Get help right away if:  Your pain is severe and is not getting better.  You develop suicidal thoughts. If you ever feel like you may hurt yourself or others, or have thoughts about taking your own life, get help right away. You can go to your nearest emergency department or call:  Your local emergency services (911 in the [place]  A suicide crisis helpline, such as the [place] at (760)154-5026. This is open 24 hours a day.  Summary  Trigeminal neuralgia is a nerve disorder that causes severe pain on one side of the face. The pain may last from a few seconds to several minutes.  This condition is caused by damage or pressure to a nerve in the head that is called the trigeminal nerve.  Treatment may include avoiding the things that trigger your symptoms, taking medicines, or having surgery or procedures. It may take up to one month for treatment to start relieving the  pain.  Avoid the things that trigger your symptoms.  Keep all follow-up visits as told by your health care provider. You may need to be monitored closely to make sure treatment is working well for you. This information is not intended to replace advice given to you by your health care provider. Make sure you discuss any questions you have with your health care provider. Document Revised: 12/10/2017 Document Reviewed: 12/10/2017 Elsevier Patient Education  2021 [employer].

## 2020-03-26 ENCOUNTER — Telehealth: Payer: Self-pay

## 2020-03-26 ENCOUNTER — Telehealth: Payer: Self-pay | Admitting: *Deleted

## 2020-03-26 LAB — URINE CULTURE
MICRO NUMBER:: 11546956
Result:: NO GROWTH
SPECIMEN QUALITY:: ADEQUATE

## 2020-03-26 LAB — HEPATITIS C ANTIBODY
Hepatitis C Ab: NONREACTIVE
SIGNAL TO CUT-OFF: 0 (ref <1.00)

## 2020-03-26 NOTE — Telephone Encounter (Signed)
Patient called inquiring about her labwork and if she needs iron.  Her values indicate a need for iron per Laverna Peace NP.  Sarah sent scheduling message for 3 doses of iron.  PAtient aware and will await the call

## 2020-03-26 NOTE — Telephone Encounter (Signed)
S/w pt per inbasket/sch message and she is aware of her iron appts.  Pt req to start week of 2/28.     [name]

## 2020-03-28 DIAGNOSIS — G8929 Other chronic pain: Secondary | ICD-10-CM | POA: Insufficient documentation

## 2020-03-28 DIAGNOSIS — R519 Headache, unspecified: Secondary | ICD-10-CM | POA: Insufficient documentation

## 2020-03-28 DIAGNOSIS — R351 Nocturia: Secondary | ICD-10-CM | POA: Insufficient documentation

## 2020-03-28 DIAGNOSIS — M549 Dorsalgia, unspecified: Secondary | ICD-10-CM | POA: Insufficient documentation

## 2020-03-28 NOTE — Assessment & Plan Note (Signed)
Encouraged moist heat and gentle stretching as tolerated. May try NSAIDs and prescription meds as directed and report if symptoms worsen or seek immediate care. Check xrays

## 2020-03-28 NOTE — Progress Notes (Signed)
Subjective:    [name], female    DOB: Dec 24, 1940, 80 y.o.   MRN: [id number]  Chief Complaint  Patient presents with  . Follow-up    HPI Patient is in today for follow up on chronic medical concerns. No recent febrile illness but she is not feeling well. She has numerous chronic concerns at this time. She notes fatigue, anorexia, left sided low back pain, left sided headache and left eye pressure. No photophobia, recent fall or trauma. She is very frustrated with feeling badly for several months now. She is questioning if she should cut back on her Metformin and Nexium. Her sugars have been well controlled as has her reflux. Denies CP/palp/SOB/HA/congestion/fevers/GI or GU c/o. Taking meds as prescribed   Past Medical History:  Diagnosis Date  . Abdominal pain in female 03/18/2010   Qualifier: Diagnosis of  By: Carlean Purl MD, Dimas Millin Anemia 06/08/2014  . Anxiety   . Arthritis    Spinal Osteoarthritis  . Cancer ([place])   . Carcinoid tumor of stomach   . Cataract   . Chest pain    Myoview 12/15 no ischemia.  . Chronic kidney disease    Left kidney smaller than right kidney  . Constipation 11/21/2016  . Diabetes mellitus type 2 in obese ([place]) 09/05/2006   Qualifier: Diagnosis of  By: Marca Ancona RMA, Lucy    . Diabetic peripheral neuropathy ([place]) 10/29/2013  . Encounter for preventative adult health care exam with abnormal findings 09/14/2013  . Esophageal reflux   . Gastric polyp    Fundic Gland  . Gastroparesis   . Headache(784.0)   . Heart murmur    Echocardiogram 2/11: EF 60-65%, mild LAE, grade 1 diastolic dysfunction, aortic valve sclerosis, mean gradient 9 mm of mercury, PASP 34  . Hematuria 03/16/2016  . Iron deficiency anemia, unspecified   . 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 Leg swelling    bilateral  . Neck pain 04/22/2015  . PONV (postoperative nausea and vomiting)    pt states only needs small amount of anesthesia  . PSVT (paroxysmal  supraventricular tachycardia) ([place])   . Pure hypercholesterolemia   . 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 Renal insufficiency 06/18/2019  . Stroke ([place])    tia, 2014  . 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 Type II or unspecified type diabetes mellitus without mention of complication, not stated as uncontrolled   . Unspecified essential hypertension   . Unspecified hereditary and idiopathic peripheral neuropathy 10/29/2013    Past Surgical History:  Procedure Laterality Date  . CHOLECYSTECTOMY  1993  . COLONOSCOPY  11/11/2010   diverticulosis  . DILATATION & CURRETTAGE/HYSTEROSCOPY WITH RESECTOCOPE N/A 02/25/2013   Procedure: Attempted hysteroscopy with uterine perforation;  Surgeon: Jamey Reas de Berton Lan, MD;  Location: [facility];  Service: Gynecology;  Laterality: N/A;  . ESOPHAGOGASTRODUODENOSCOPY  08/29/2010; 09/15/2010   Carcinoid tumor less than 1 cm in July 2012 not seen in August 2012 , gastritis, fundic gland polyps  . ESOPHAGOGASTRODUODENOSCOPY  05/16/2011  . ESOPHAGOGASTRODUODENOSCOPY  06/14/2012  . EUS  12/15/2010   Procedure: UPPER ENDOSCOPIC ULTRASOUND (EUS) LINEAR;  Surgeon: Owens Loffler, MD;  Location: [facility];  Service: Endoscopy;  Laterality: N/A;  . EYE SURGERY Bilateral    Bi lateral cateracts and bi lateral laser  . LAPAROSCOPY N/A 02/25/2013   Procedure: Cystoscopy and laparoscopy with fulguration of uterine serosa;  Surgeon: Jamey Reas de Berton Lan, MD;  Location: [facility];  Service: Gynecology;  Laterality: N/A;  . TONSILLECTOMY      Family History  Problem Relation Age of Onset  . Diabetes Mother   . Stroke Father        deceased age 79  . Heart disease Sister        deceased MI age 43  . Heart disease Brother        deceased MI age 62  . Diabetes Maternal Grandmother   . Hypertension Paternal Grandmother   . Diabetes Sister   . Heart disease Sister   . Hypertension Sister   . Hyperlipidemia Sister   . Diabetes Sister   . Heart disease Sister   .  Hypertension Sister   . Hyperlipidemia Sister   . Diabetes Brother   . Heart disease Brother   . Hypertension Brother   . Hyperlipidemia Brother   . Colon cancer Neg Hx   . Esophageal cancer Neg Hx   . Stomach cancer Neg Hx   . Rectal cancer Neg Hx     Social History   Socioeconomic History  . Marital status: Widowed    Spouse name: Not on file  . Number of children: 0  . Years of education: college  . Highest education level: Not on file  Occupational History  . Occupation: retired  Tobacco Use  . Smoking status: Never Smoker  . Smokeless tobacco: Never Used  . Tobacco comment: Never used tobacco  Vaping Use  . Vaping Use: Never used  Substance and Sexual Activity  . Alcohol use: No    Alcohol/week: 0.0 standard drinks  . Drug use: No  . Sexual activity: Never    Partners: Male    Birth control/protection: Post-menopausal    Comment: lives alone, no dietary restrictions except avoid fresh veg, fruit, whole grains  Other Topics Concern  . Not on file  Social History Narrative   Patient was married ([name]) - widow   Patient does not have any children.   Patient is right-handed.   Patient has a BA degree.   One caffeine drink daily    Social Determinants of Health   Financial Resource Strain: Not on file  Food Insecurity: Not on file  Transportation Needs: Not on file  Physical Activity: Not on file  Stress: Not on file  Social Connections: Not on file  Intimate Partner Violence: Not on file    Outpatient Medications Prior to Visit  Medication Sig Dispense Refill  . amLODipine (NORVASC) 5 MG tablet TAKE 1 TABLET BY MOUTH  DAILY 90 tablet 3  . betamethasone valerate ointment (VALISONE) 0.1 % Apply qod to affected area, small amount 45 g 1  . Blood Glucose Monitoring Suppl (ONETOUCH ULTRALINK) w/Device KIT Use to check blood sugars BID E11.69 1 kit 0  . cholecalciferol (VITAMIN D) 1000 UNITS tablet Take 1,000 Units by mouth daily.    Marland Kitchen dicyclomine (BENTYL) 10  MG capsule TAKE ONE CAPSULE BY MOUTH 4 TIMES DAILY AS NEEDED 120 capsule 1  . glucose blood (ONETOUCH ULTRA) test strip CHECK BLOOD SUGAR 3 TIMES  DAILY OR AS NEEDED. 300 strip 3  . hydrALAZINE (APRESOLINE) 10 MG tablet Take 10 mg by mouth as needed.    . Lancets (ONETOUCH DELICA PLUS [id number]) MISC Use as directed 3 times a day.  DX code: E11.69 300 each 1  . losartan (COZAAR) 50 MG tablet TAKE 1 TABLET BY MOUTH  TWICE DAILY 180 tablet 3  . metoCLOPramide (REGLAN) 5 MG tablet TAKE 1 TABLET  BY MOUTH  TWICE DAILY 180 tablet 0  . metoprolol tartrate (LOPRESSOR) 50 MG tablet TAKE 1 AND 1/2 TABLETS BY  MOUTH TWICE DAILY 270 tablet 3  . ondansetron (ZOFRAN) 4 MG tablet Take 4 mg by mouth every 4 (four) hours as needed. For nausea    . potassium chloride (KLOR-CON) 10 MEQ tablet TAKE 1 TABLET BY MOUTH  DAILY 90 tablet 3  . rosuvastatin (CRESTOR) 20 MG tablet Take 1 tablet (20 mg total) by mouth daily. 90 tablet 1  . thiamine 100 MG tablet Take 1 tablet (100 mg total) by mouth daily. 90 tablet 1  . famotidine (PEPCID) 20 MG tablet Take 1 tablet (20 mg total) by mouth at bedtime as needed for heartburn or indigestion. 30 tablet 0  . metFORMIN (GLUCOPHAGE-XR) 500 MG 24 hr tablet Take 1 tablet (500 mg total) by mouth in the morning and at bedtime. 180 tablet 1  . NEXIUM 40 MG capsule Take 1 capsule (40 mg total) by mouth daily. 90 capsule 3  . sodium chloride (OCEAN) 0.65 % SOLN nasal spray Place 1 spray into both nostrils as needed for congestion. 15 mL 0   No facility-administered medications prior to visit.    Allergies  Allergen Reactions  . Tramadol Other (See Comments)    Dizziness     Review of Systems  Constitutional: Positive for malaise/fatigue. Negative for fever.  HENT: Negative for congestion.   Eyes: Positive for pain. Negative for blurred vision, double vision, photophobia, discharge and redness.  Respiratory: Negative for shortness of breath.   Cardiovascular: Negative for chest  pain, palpitations and leg swelling.  Gastrointestinal: Negative for abdominal pain, blood in stool and nausea.  Genitourinary: Negative for dysuria and frequency.  Musculoskeletal: Positive for back pain and joint pain. Negative for falls.  Skin: Negative for rash.  Neurological: Positive for headaches. Negative for dizziness and loss of consciousness.  Endo/Heme/Allergies: Negative for environmental allergies.  Psychiatric/Behavioral: Positive for depression. The patient is nervous/anxious.        Objective:    Physical Exam Vitals and nursing note reviewed.  Constitutional:      General: She is not in acute distress.    Appearance: She is well-developed and well-nourished.  HENT:     Head: Normocephalic and atraumatic.     Nose: Nose normal.  Eyes:     General:        Right eye: No discharge.        Left eye: No discharge.  Cardiovascular:     Rate and Rhythm: Normal rate and regular rhythm.     Heart sounds: No murmur heard.   Pulmonary:     Effort: Pulmonary effort is normal.     Breath sounds: Normal breath sounds.  Abdominal:     General: Bowel sounds are normal.     Palpations: Abdomen is soft.     Tenderness: There is no abdominal tenderness.  Musculoskeletal:        General: No edema.     Cervical back: Normal range of motion and neck supple.  Skin:    General: Skin is warm and dry.  Neurological:     Mental Status: She is alert and oriented to person, place, and time.  Psychiatric:        Mood and Affect: Mood and affect normal.     BP 126/68   Pulse 65   Temp 98.4 F (36.9 C)   Resp 16   Wt 169 lb (76.7 kg)  LMP 02/07/1992   SpO2 99%   BMI 31.93 kg/m  Wt Readings from Last 3 Encounters:  03/25/20 169 lb (76.7 kg)  03/19/20 169 lb (76.7 kg)  01/16/20 169 lb 1.9 oz (76.7 kg)    Diabetic Foot Exam - Simple   No data filed    Lab Results  Component Value Date   WBC 9.6 03/19/2020   HGB 12.7 03/19/2020   HCT 37.4 03/19/2020   PLT 203  03/19/2020   GLUCOSE 111 (H) 03/25/2020   CHOL 185 03/25/2020   TRIG 207.0 (H) 03/25/2020   HDL 61.70 03/25/2020   LDLDIRECT 95.0 03/25/2020   LDLCALC 116 (H) 11/13/2019   ALT 19 03/25/2020   AST 18 03/25/2020   NA 132 (L) 03/25/2020   K 4.6 03/25/2020   CL 96 03/25/2020   CREATININE 0.78 03/25/2020   BUN 21 03/25/2020   CO2 30 03/25/2020   TSH 1.87 03/25/2020   INR 0.88 01/16/2011   HGBA1C 6.4 03/25/2020   MICROALBUR 1.6 11/13/2014    Lab Results  Component Value Date   TSH 1.87 03/25/2020   Lab Results  Component Value Date   WBC 9.6 03/19/2020   HGB 12.7 03/19/2020   HCT 37.4 03/19/2020   MCV 86.2 03/19/2020   PLT 203 03/19/2020   Lab Results  Component Value Date   NA 132 (L) 03/25/2020   K 4.6 03/25/2020   CHLORIDE 101 07/08/2015   CO2 30 03/25/2020   GLUCOSE 111 (H) 03/25/2020   BUN 21 03/25/2020   CREATININE 0.78 03/25/2020   BILITOT 0.6 03/25/2020   ALKPHOS 53 03/25/2020   AST 18 03/25/2020   ALT 19 03/25/2020   PROT 7.2 03/25/2020   ALBUMIN 4.5 03/25/2020   CALCIUM 10.4 03/25/2020   ANIONGAP 8 03/19/2020   EGFR 75 (L) 07/08/2015   GFR 72.46 03/25/2020   Lab Results  Component Value Date   CHOL 185 03/25/2020   Lab Results  Component Value Date   HDL 61.70 03/25/2020   Lab Results  Component Value Date   LDLCALC 116 (H) 11/13/2019   Lab Results  Component Value Date   TRIG 207.0 (H) 03/25/2020   Lab Results  Component Value Date   CHOLHDL 3 03/25/2020   Lab Results  Component Value Date   HGBA1C 6.4 03/25/2020       Assessment & Plan:   Problem List Items Addressed This Visit    Diabetes mellitus type 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 Increase exercise as tolerated. Continue current meds      Relevant Medications   metFORMIN (GLUCOPHAGE-XR) 500 MG 24 hr tablet   Other Relevant Orders   Hemoglobin A1c (Completed)   Hemoglobin A1c   TSH   Essential hypertension (Chronic)    Well  controlled, no changes to meds. Encouraged heart healthy diet such as the DASH diet and exercise as tolerated.       Relevant Orders   Comprehensive metabolic panel (Completed)   TSH (Completed)   Anemia - Primary (Chronic)   Relevant Orders   CBC with Differential/Platelet   Iron malabsorption (Chronic)    Will drop the strength of her Nexium and Metformin for now and monitor symptoms      HYPERCHOLESTEROLEMIA    Tolerating statin, encouraged heart healthy diet, avoid trans fats, minimize simple carbs and saturated fats. Increase exercise as tolerated      Relevant Orders   Lipid panel (Completed)   CBC  with Differential/Platelet   Comprehensive metabolic panel   TSH   Lipid panel   Vitamin D deficiency    Supplement and monitor      Renal insufficiency    Hydrate and monitor      Chronic left-sided back pain    Encouraged moist heat and gentle stretching as tolerated. May try NSAIDs and prescription meds as directed and report if symptoms worsen or seek immediate care. Check xrays      Relevant Orders   DG Lumbar Spine 2-3 Views   DG Thoracic Spine 2 View   Nocturia    Check UA      Left-sided headache    Encouraged increased hydration, 64 ounces of clear fluids daily. Minimize alcohol and caffeine. Eat small frequent meals with lean proteins and complex carbs. Avoid high and low blood sugars. Get adequate sleep, 7-8 hours a night. Needs exercise daily preferably in the morning. She has an MRI brain ordered for near future with [facility]. She will see opthamology as well.        Other Visit Diagnoses    Other problems related to lifestyle       Relevant Orders   Hepatitis C Antibody (Completed)   Breast cancer screening, high risk patient       Relevant Orders   MM 3D SCREEN BREAST BILATERAL   Flank pain       Chronic flank pain       Relevant Orders   Urinalysis (Completed)   Urine Culture (Completed)      I have discontinued Keni N. Wiley's sodium  chloride, NexIUM, and famotidine. I have also changed her metFORMIN. Additionally, I am having her start on esomeprazole. Lastly, I am having her maintain her cholecalciferol, ondansetron, OneTouch UltraLink, betamethasone valerate ointment, amLODipine, potassium chloride, dicyclomine, OneTouch Delica Plus [id number], OneTouch Ultra, hydrALAZINE, metoprolol tartrate, rosuvastatin, thiamine, losartan, and metoCLOPramide.  Meds ordered this encounter  Medications  . metFORMIN (GLUCOPHAGE-XR) 500 MG 24 hr tablet    Sig: Take 1 tablet (500 mg total) by mouth daily.    Dispense:  180 tablet    Refill:  1    Requesting 1 year supply  . esomeprazole (NEXIUM) 20 MG capsule    Sig: Take 1 capsule (20 mg total) by mouth daily at 12 noon.     Penni Homans, MD

## 2020-03-28 NOTE — Assessment & Plan Note (Signed)
Will drop the strength of her Nexium and Metformin for now and monitor symptoms

## 2020-03-28 NOTE — Assessment & Plan Note (Signed)
Supplement and monitor 

## 2020-03-28 NOTE — Assessment & Plan Note (Signed)
Hydrate and monitor 

## 2020-03-28 NOTE — Assessment & Plan Note (Signed)
Check UA 

## 2020-03-28 NOTE — Assessment & Plan Note (Signed)
Encouraged increased hydration, 64 ounces of clear fluids daily. Minimize alcohol and caffeine. Eat small frequent meals with lean proteins and complex carbs. Avoid high and low blood sugars. Get adequate sleep, 7-8 hours a night. Needs exercise daily preferably in the morning. She has an MRI brain ordered for near future with [facility]. She will see opthamology as well.

## 2020-03-28 NOTE — Assessment & Plan Note (Signed)
hgba1c acceptable, minimize simple carbs. Increase exercise as tolerated. Continue current meds 

## 2020-03-28 NOTE — Assessment & Plan Note (Signed)
Well controlled, no changes to meds. Encouraged heart healthy diet such as the DASH diet and exercise as tolerated.  °

## 2020-03-28 NOTE — Assessment & Plan Note (Signed)
Tolerating statin, encouraged heart healthy diet, avoid trans fats, minimize simple carbs and saturated fats. Increase exercise as tolerated 

## 2020-03-29 DIAGNOSIS — H5712 Ocular pain, left eye: Secondary | ICD-10-CM | POA: Diagnosis not present

## 2020-03-29 DIAGNOSIS — E119 Type 2 diabetes mellitus without complications: Secondary | ICD-10-CM | POA: Diagnosis not present

## 2020-03-29 DIAGNOSIS — H04123 Dry eye syndrome of bilateral lacrimal glands: Secondary | ICD-10-CM | POA: Diagnosis not present

## 2020-03-29 DIAGNOSIS — H35033 Hypertensive retinopathy, bilateral: Secondary | ICD-10-CM | POA: Diagnosis not present

## 2020-04-03 ENCOUNTER — Other Ambulatory Visit: Payer: Self-pay | Admitting: Family Medicine

## 2020-04-05 ENCOUNTER — Other Ambulatory Visit: Payer: Self-pay

## 2020-04-05 ENCOUNTER — Inpatient Hospital Stay: Payer: Medicare Other

## 2020-04-05 VITALS — BP 128/65 | HR 53 | Temp 97.8°F | Resp 17

## 2020-04-05 DIAGNOSIS — K580 Irritable bowel syndrome with diarrhea: Secondary | ICD-10-CM | POA: Diagnosis not present

## 2020-04-05 DIAGNOSIS — Z79899 Other long term (current) drug therapy: Secondary | ICD-10-CM | POA: Diagnosis not present

## 2020-04-05 DIAGNOSIS — D500 Iron deficiency anemia secondary to blood loss (chronic): Secondary | ICD-10-CM

## 2020-04-05 DIAGNOSIS — K909 Intestinal malabsorption, unspecified: Secondary | ICD-10-CM

## 2020-04-05 DIAGNOSIS — D508 Other iron deficiency anemias: Secondary | ICD-10-CM | POA: Diagnosis not present

## 2020-04-05 MED ORDER — SODIUM CHLORIDE 0.9 % IV SOLN
Freq: Once | INTRAVENOUS | Status: AC
  Filled 2020-04-05: qty 250

## 2020-04-05 MED ORDER — SODIUM CHLORIDE 0.9 % IV SOLN
200.0000 mg | Freq: Once | INTRAVENOUS | Status: AC
  Administered 2020-04-05: 200 mg via INTRAVENOUS
  Filled 2020-04-05: qty 200

## 2020-04-05 NOTE — Patient Instructions (Signed)

## 2020-04-12 ENCOUNTER — Ambulatory Visit: Payer: Medicare Other

## 2020-04-12 ENCOUNTER — Inpatient Hospital Stay: Payer: Medicare Other | Attending: Hematology & Oncology

## 2020-04-12 ENCOUNTER — Other Ambulatory Visit: Payer: Self-pay

## 2020-04-12 VITALS — BP 122/60 | HR 59 | Temp 98.0°F | Resp 16

## 2020-04-12 DIAGNOSIS — D500 Iron deficiency anemia secondary to blood loss (chronic): Secondary | ICD-10-CM

## 2020-04-12 DIAGNOSIS — D508 Other iron deficiency anemias: Secondary | ICD-10-CM | POA: Insufficient documentation

## 2020-04-12 DIAGNOSIS — K909 Intestinal malabsorption, unspecified: Secondary | ICD-10-CM | POA: Insufficient documentation

## 2020-04-12 MED ORDER — SODIUM CHLORIDE 0.9 % IV SOLN
200.0000 mg | Freq: Once | INTRAVENOUS | Status: AC
  Administered 2020-04-12: 200 mg via INTRAVENOUS
  Filled 2020-04-12: qty 200

## 2020-04-12 MED ORDER — SODIUM CHLORIDE 0.9 % IV SOLN
Freq: Once | INTRAVENOUS | Status: AC
  Filled 2020-04-12: qty 250

## 2020-04-12 NOTE — Patient Instructions (Signed)

## 2020-04-19 ENCOUNTER — Inpatient Hospital Stay: Payer: Medicare Other

## 2020-04-19 ENCOUNTER — Other Ambulatory Visit: Payer: Self-pay

## 2020-04-19 ENCOUNTER — Ambulatory Visit: Payer: Medicare Other

## 2020-04-19 VITALS — BP 148/63 | HR 63 | Temp 97.9°F | Resp 17

## 2020-04-19 DIAGNOSIS — D508 Other iron deficiency anemias: Secondary | ICD-10-CM | POA: Diagnosis not present

## 2020-04-19 DIAGNOSIS — K909 Intestinal malabsorption, unspecified: Secondary | ICD-10-CM | POA: Diagnosis not present

## 2020-04-19 DIAGNOSIS — D500 Iron deficiency anemia secondary to blood loss (chronic): Secondary | ICD-10-CM

## 2020-04-19 MED ORDER — SODIUM CHLORIDE 0.9 % IV SOLN
1000.0000 mL | Freq: Once | INTRAVENOUS | Status: AC
  Administered 2020-04-19: 250 mL via INTRAVENOUS
  Filled 2020-04-19: qty 1000

## 2020-04-19 MED ORDER — SODIUM CHLORIDE 0.45 % IV SOLN
INTRAVENOUS | Status: DC
  Filled 2020-04-19: qty 1000

## 2020-04-19 MED ORDER — SODIUM CHLORIDE 0.9 % IV SOLN
200.0000 mg | Freq: Once | INTRAVENOUS | Status: AC
  Administered 2020-04-19: 200 mg via INTRAVENOUS
  Filled 2020-04-19: qty 200

## 2020-04-19 NOTE — Patient Instructions (Signed)

## 2020-04-26 ENCOUNTER — Encounter (HOSPITAL_BASED_OUTPATIENT_CLINIC_OR_DEPARTMENT_OTHER): Payer: Self-pay

## 2020-04-26 ENCOUNTER — Ambulatory Visit (HOSPITAL_BASED_OUTPATIENT_CLINIC_OR_DEPARTMENT_OTHER)
Admission: RE | Admit: 2020-04-26 | Discharge: 2020-04-26 | Disposition: A | Discharge status: Home / Self Care | Payer: Medicare Other | Source: Ambulatory Visit | Attending: Family Medicine | Admitting: Family Medicine

## 2020-04-26 ENCOUNTER — Ambulatory Visit (HOSPITAL_BASED_OUTPATIENT_CLINIC_OR_DEPARTMENT_OTHER): Payer: Medicare Other

## 2020-04-26 ENCOUNTER — Other Ambulatory Visit: Payer: Self-pay

## 2020-04-26 DIAGNOSIS — Z0389 Encounter for observation for other suspected diseases and conditions ruled out: Secondary | ICD-10-CM | POA: Diagnosis not present

## 2020-04-26 DIAGNOSIS — E2839 Other primary ovarian failure: Secondary | ICD-10-CM | POA: Insufficient documentation

## 2020-04-26 DIAGNOSIS — Z1239 Encounter for other screening for malignant neoplasm of breast: Secondary | ICD-10-CM | POA: Diagnosis not present

## 2020-04-26 DIAGNOSIS — Z780 Asymptomatic menopausal state: Secondary | ICD-10-CM | POA: Insufficient documentation

## 2020-04-26 DIAGNOSIS — Z13820 Encounter for screening for osteoporosis: Secondary | ICD-10-CM | POA: Diagnosis not present

## 2020-04-27 ENCOUNTER — Telehealth: Payer: Self-pay | Admitting: *Deleted

## 2020-04-27 NOTE — Telephone Encounter (Signed)
Patient notified of bone density results.

## 2020-05-10 ENCOUNTER — Ambulatory Visit (HOSPITAL_BASED_OUTPATIENT_CLINIC_OR_DEPARTMENT_OTHER): Payer: Medicare Other

## 2020-05-10 ENCOUNTER — Ambulatory Visit (HOSPITAL_BASED_OUTPATIENT_CLINIC_OR_DEPARTMENT_OTHER)
Admission: RE | Admit: 2020-05-10 | Discharge: 2020-05-10 | Disposition: A | Discharge status: Home / Self Care | Payer: Medicare Other | Source: Ambulatory Visit | Attending: Family Medicine | Admitting: Family Medicine

## 2020-05-10 ENCOUNTER — Other Ambulatory Visit: Payer: Self-pay

## 2020-05-10 DIAGNOSIS — Z1231 Encounter for screening mammogram for malignant neoplasm of breast: Secondary | ICD-10-CM | POA: Diagnosis not present

## 2020-05-10 DIAGNOSIS — Z1239 Encounter for other screening for malignant neoplasm of breast: Secondary | ICD-10-CM | POA: Diagnosis present

## 2020-05-12 NOTE — Progress Notes (Signed)
Cardiology Office Note   Date:  05/13/2020   ID:  [name], DOB 1940-10-18, MRN [id number]  PCP:  [name], MD  Cardiologist:   Minus Breeding, MD   Chief Complaint  Patient presents with  . SVT      History of Present Illness: [name] is a 80 y.o. female who  presents for followup of SVT. Since I last saw her since I last saw her she has been getting iron infusions.  She feels probably a little better with this.  She has a little more energy.  She says she is trying to eat better.  She is doing a little bit of exercising at home.  She has had some head pain.  She points to her left temple.  She has had an evaluation including a sedimentation rate that was not elevated.  There has been a suggestion that she should get a CT.  She is not describing new chest pressure though she gets some fleeting chest discomfort.  She has occasional palpitations but it is not like the supraventricular tachycardia that she had before.  She is not having any presyncope or syncope.  She is having no weight gain or edema.   Past Medical History:  Diagnosis Date  . Abdominal pain in female 03/18/2010   Qualifier: Diagnosis of  By: Carlean Purl MD, Dimas Millin Anemia 06/08/2014  . Anxiety   . Arthritis    Spinal Osteoarthritis  . Cancer ([place])   . Carcinoid tumor of stomach   . Cataract   . Chest pain    Myoview 12/15 no ischemia.  . Chronic kidney disease    Left kidney smaller than right kidney  . Constipation 11/21/2016  . Diabetes mellitus type 2 in obese ([place]) 09/05/2006   Qualifier: Diagnosis of  By: Marca Ancona RMA, Lucy    . Diabetic peripheral neuropathy ([place]) 10/29/2013  . Encounter for preventative adult health care exam with abnormal findings 09/14/2013  . Esophageal reflux   . Gastric polyp    Fundic Gland  . Gastroparesis   . Headache(784.0)   . Heart murmur    Echocardiogram 2/11: EF 60-65%, mild LAE, grade 1 diastolic dysfunction, aortic valve sclerosis,  mean gradient 9 mm of mercury, PASP 34  . Hematuria 03/16/2016  . Iron deficiency anemia, unspecified   . 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 Leg swelling    bilateral  . Neck pain 04/22/2015  . PONV (postoperative nausea and vomiting)    pt states only needs small amount of anesthesia  . PSVT (paroxysmal supraventricular tachycardia) ([place])   . Pure hypercholesterolemia   . 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 Renal insufficiency 06/18/2019  . Stroke ([place])    tia, 2014  . 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 Type II or unspecified type diabetes mellitus without mention of complication, not stated as uncontrolled   . Unspecified essential hypertension   . Unspecified hereditary and idiopathic peripheral neuropathy 10/29/2013    Past Surgical History:  Procedure Laterality Date  . CHOLECYSTECTOMY  1993  . COLONOSCOPY  11/11/2010   diverticulosis  . DILATATION & CURRETTAGE/HYSTEROSCOPY WITH RESECTOCOPE N/A 02/25/2013   Procedure: Attempted hysteroscopy with uterine perforation;  Surgeon: Jamey Reas de Berton Lan, MD;  Location: [facility];  Service: Gynecology;  Laterality: N/A;  . ESOPHAGOGASTRODUODENOSCOPY  08/29/2010; 09/15/2010   Carcinoid tumor less than 1 cm in July 2012 not seen in August 2012 , gastritis, fundic gland polyps  .  ESOPHAGOGASTRODUODENOSCOPY  05/16/2011  . ESOPHAGOGASTRODUODENOSCOPY  06/14/2012  . EUS  12/15/2010   Procedure: UPPER ENDOSCOPIC ULTRASOUND (EUS) LINEAR;  Surgeon: Owens Loffler, MD;  Location: [facility];  Service: Endoscopy;  Laterality: N/A;  . EYE SURGERY Bilateral    Bi lateral cateracts and bi lateral laser  . LAPAROSCOPY N/A 02/25/2013   Procedure: Cystoscopy and laparoscopy with fulguration of uterine serosa;  Surgeon: Jamey Reas de Berton Lan, MD;  Location: [facility];  Service: Gynecology;  Laterality: N/A;  . TONSILLECTOMY       Current Outpatient Medications  Medication Sig Dispense Refill  . amLODipine (NORVASC) 5 MG tablet TAKE 1 TABLET BY MOUTH  DAILY  90 tablet 3  . betamethasone valerate ointment (VALISONE) 0.1 % Apply qod to affected area, small amount 45 g 1  . Blood Glucose Monitoring Suppl (ONETOUCH ULTRALINK) w/Device KIT Use to check blood sugars BID E11.69 1 kit 0  . cholecalciferol (VITAMIN D) 1000 UNITS tablet Take 1,000 Units by mouth daily.    Marland Kitchen dicyclomine (BENTYL) 10 MG capsule TAKE ONE CAPSULE BY MOUTH 4 TIMES DAILY AS NEEDED 120 capsule 1  . esomeprazole (NEXIUM) 20 MG capsule Take 1 capsule (20 mg total) by mouth daily at 12 noon.    Marland Kitchen glucose blood (ONETOUCH ULTRA) test strip CHECK BLOOD SUGAR 3 TIMES  DAILY OR AS NEEDED. 300 strip 3  . hydrALAZINE (APRESOLINE) 10 MG tablet Take 10 mg by mouth as needed.    . Lancets (ONETOUCH DELICA PLUS [id number]) MISC Use as directed 3 times a day.  DX code: E11.69 300 each 1  . losartan (COZAAR) 50 MG tablet TAKE 1 TABLET BY MOUTH  TWICE DAILY 180 tablet 3  . metFORMIN (GLUCOPHAGE-XR) 500 MG 24 hr tablet Take 1 tablet (500 mg total) by mouth daily. 180 tablet 1  . metoCLOPramide (REGLAN) 5 MG tablet TAKE 1 TABLET BY MOUTH  TWICE DAILY 180 tablet 0  . metoprolol tartrate (LOPRESSOR) 50 MG tablet TAKE 1 AND 1/2 TABLETS BY  MOUTH TWICE DAILY 270 tablet 3  . ondansetron (ZOFRAN) 4 MG tablet Take 4 mg by mouth every 4 (four) hours as needed. For nausea    . potassium chloride (KLOR-CON) 10 MEQ tablet TAKE 1 TABLET BY MOUTH  DAILY 90 tablet 3  . rosuvastatin (CRESTOR) 20 MG tablet TAKE 1 TABLET BY MOUTH  DAILY 90 tablet 3   No current facility-administered medications for this visit.    Allergies:   Tramadol    ROS:  Please see the history of present illness.   Otherwise, review of systems are positive for none.   All other systems are reviewed and negative.    PHYSICAL EXAM: VS:  BP (!) 144/66 (BP Location: Left Arm, Patient Position: Sitting, Cuff Size: Normal)   Pulse 66   Ht _0  (1.549 m)   Wt 168 lb (76.2 kg)   LMP 02/07/1992   SpO2 97%   BMI 31.74 kg/m  , BMI Body mass  index is 31.74 kg/m. GENERAL:  Well appearing NECK:  No jugular venous distention, waveform within normal limits, carotid upstroke brisk and symmetric, no bruits, no thyromegaly LUNGS:  Clear to auscultation bilaterally CHEST:  Unremarkable HEART:  PMI not displaced or sustained,S1 and S2 within normal limits, no S3, no S4, no clicks, no rubs, soft brief apical systolic murmur, no diastolic murmurs ABD:  Flat, positive bowel sounds normal in frequency in pitch, no bruits, no rebound, no guarding, no midline pulsatile mass,  no hepatomegaly, no splenomegaly EXT:  2 plus pulses throughout, no edema, no cyanosis no clubbing   EKG:  EKG is  ordered today. Sinus rhythm, rate 68, axis within normal limits, intervals within normal limits, no acute ST-T wave changes.  Recent Labs: 03/19/2020: Hemoglobin 12.7; Platelet Count 203 03/25/2020: ALT 19; BUN 21; Creatinine, Ser 0.78; Potassium 4.6; Sodium 132; TSH 1.87    Lipid Panel    Component Value Date/Time   CHOL 185 03/25/2020 1149   TRIG 207.0 (H) 03/25/2020 1149   TRIG 94 11/16/2005 0905   HDL 61.70 03/25/2020 1149   CHOLHDL 3 03/25/2020 1149   VLDL 41.4 (H) 03/25/2020 1149   LDLCALC 116 (H) 11/13/2019 0906   LDLDIRECT 95.0 03/25/2020 1149      Wt Readings from Last 3 Encounters:  05/13/20 168 lb (76.2 kg)  03/25/20 169 lb (76.7 kg)  03/19/20 169 lb (76.7 kg)      Other studies Reviewed: Additional studies/ records that were reviewed today include: Labs. Review of the above records demonstrates:  Please see elsewhere in the note.     ASSESSMENT AND PLAN:   SVT:    She is not having any symptomatic paroxysms.  No change in therapy.   Hypertension- Her blood pressure is slightly labile.  She does not really want me to increase the medications.  It is elevated more in the morning comes down in the evening and afternoons typically.  No change in therapy.   Dyslipidemia - Her LDL is 95.  She is on higher dose statin.   This is much better than it used to be.  In the distant past her LDL was 195.  I congratulated her and encouraged more good diet changes.   Carotid stenosis - This was mild in 2019.  I will follow up with repeat Doppler  Aortic stenosis- This is very mild in the past and not evident on the most recent echo in 2019.   Aortic atherosclerosis - We are managing this with aggressive risk reduction.    Current medicines are reviewed at length with the patient today.  The patient does not have concerns regarding medicines.  The following changes have been made:   None  Labs/ tests ordered today include:   Orders Placed This Encounter  Procedures  . EKG 12-Lead  . VAS US CAROTID     Disposition:   FU with me 6  months.     Signed, Minus Breeding, MD  05/13/2020 3:16 PM    [facility]

## 2020-05-13 ENCOUNTER — Other Ambulatory Visit: Payer: Self-pay

## 2020-05-13 ENCOUNTER — Ambulatory Visit: Payer: Medicare Other | Admitting: Cardiology

## 2020-05-13 ENCOUNTER — Encounter: Payer: Self-pay | Admitting: Cardiology

## 2020-05-13 VITALS — BP 144/66 | HR 66 | Ht 61.0 in | Wt 168.0 lb

## 2020-05-13 DIAGNOSIS — I6529 Occlusion and stenosis of unspecified carotid artery: Secondary | ICD-10-CM | POA: Diagnosis not present

## 2020-05-13 DIAGNOSIS — I10 Essential (primary) hypertension: Secondary | ICD-10-CM | POA: Diagnosis not present

## 2020-05-13 DIAGNOSIS — I471 Supraventricular tachycardia: Secondary | ICD-10-CM | POA: Diagnosis not present

## 2020-05-13 DIAGNOSIS — E785 Hyperlipidemia, unspecified: Secondary | ICD-10-CM

## 2020-05-13 DIAGNOSIS — I350 Nonrheumatic aortic (valve) stenosis: Secondary | ICD-10-CM

## 2020-05-13 NOTE — Patient Instructions (Signed)
Medication Instructions:  Continue current medication  *If you need a refill on your cardiac medications before your next appointment, please call your pharmacy*   Lab Work: None Ordered   Testing/Procedures: Your physician has requested that you have a carotid duplex. This test is an ultrasound of the carotid arteries in your neck. It looks at blood flow through these arteries that supply the brain with blood. Allow one hour for this exam. There are no restrictions or special instructions.  Follow-Up: At [facility], you and your health needs are our priority.  As part of our continuing mission to provide you with exceptional heart care, we have created designated Provider Care Teams.  These Care Teams include your primary Cardiologist (physician) and Advanced Practice Providers (APPs -  Physician Assistants and Nurse Practitioners) who all work together to provide you with the care you need, when you need it.  We recommend signing up for the patient portal called "MyChart".  Sign up information is provided on this After Visit Summary.  MyChart is used to connect with patients for Virtual Visits (Telemedicine).  Patients are able to view lab/test results, encounter notes, upcoming appointments, etc.  Non-urgent messages can be sent to your provider as well.   To learn more about what you can do with MyChart, go to [email or web address].    Your next appointment:   6 month(s)  The format for your next appointment:   In Person  Provider:   You may see Minus Breeding, MD or one of the following Advanced Practice Providers on your designated Care Team:    Rosaria Ferries, PA-C  Jory Sims, DNP, ANP

## 2020-05-14 ENCOUNTER — Telehealth: Payer: Self-pay | Admitting: *Deleted

## 2020-05-14 DIAGNOSIS — Z961 Presence of intraocular lens: Secondary | ICD-10-CM | POA: Diagnosis not present

## 2020-05-14 DIAGNOSIS — E119 Type 2 diabetes mellitus without complications: Secondary | ICD-10-CM | POA: Diagnosis not present

## 2020-05-14 DIAGNOSIS — H5712 Ocular pain, left eye: Secondary | ICD-10-CM | POA: Diagnosis not present

## 2020-05-14 DIAGNOSIS — H04123 Dry eye syndrome of bilateral lacrimal glands: Secondary | ICD-10-CM | POA: Diagnosis not present

## 2020-05-14 NOTE — Telephone Encounter (Signed)
Patient called and wanted mammogram results.  Advised they were normal and that she would be getting a letter.

## 2020-05-25 ENCOUNTER — Ambulatory Visit (HOSPITAL_BASED_OUTPATIENT_CLINIC_OR_DEPARTMENT_OTHER)
Admission: RE | Admit: 2020-05-25 | Discharge: 2020-05-25 | Disposition: A | Discharge status: Home / Self Care | Payer: Medicare Other | Source: Ambulatory Visit | Attending: Family Medicine | Admitting: Family Medicine

## 2020-05-25 ENCOUNTER — Other Ambulatory Visit: Payer: Self-pay

## 2020-05-25 DIAGNOSIS — M549 Dorsalgia, unspecified: Secondary | ICD-10-CM | POA: Diagnosis not present

## 2020-05-25 DIAGNOSIS — R42 Dizziness and giddiness: Secondary | ICD-10-CM | POA: Diagnosis not present

## 2020-05-25 DIAGNOSIS — M545 Low back pain, unspecified: Secondary | ICD-10-CM | POA: Diagnosis not present

## 2020-05-25 DIAGNOSIS — G8929 Other chronic pain: Secondary | ICD-10-CM

## 2020-05-25 DIAGNOSIS — R519 Headache, unspecified: Secondary | ICD-10-CM | POA: Diagnosis not present

## 2020-05-25 DIAGNOSIS — M546 Pain in thoracic spine: Secondary | ICD-10-CM | POA: Diagnosis not present

## 2020-05-25 NOTE — Progress Notes (Deleted)
[place] at [facility] [address] [place], [place] [address] [phone]  Date:  05/27/2020   Name:  [name]   DOB:  20-Jul-1940   MRN:  [id number]  PCP:  [name], MD    Chief Complaint: No chief complaint on file.   History of Present Illness:  [name] is a 80 y.o. very pleasant female patient who presents with the following:  Primary patient of my partner Dr.Blyth, here today with concern of pain in her head  History of hypertension, IBS/GERD/gastroparesis, diabetes, carcinoid tumor of stomach, renal insufficiency, hyperlipidemia, TIA, carotid stenosis, SVT, iron deficiency I have not seen her myself previously Most recent visit with cardiology, Dr. Percival Spanish earlier this month:  SVT: She is not having any symptomatic paroxysms.  No change in therapy.  Hypertension- Her blood pressure is slightly labile.  She does not really want me to increase the medications.  It is elevated more in the morning comes down in the evening and afternoons typically.  No change in therapy.  Dyslipidemia - Her LDL is 95.  She is on higher dose statin.  This is much better than it used to be.  In the distant past her LDL was 195.  I congratulated her and encouraged more good diet changes.  Carotid stenosis - This was mild in 2019.  I will follow up with repeat Doppler Aortic stenosis- This is very mild in the past and not evident on the most recent echo in 2019.  Aortic atherosclerosis - We are managing this with aggressive risk reduction Patient Active Problem List   Diagnosis Date Noted  . Chronic left-sided back pain 03/28/2020  . Nocturia 03/28/2020  . Left-sided headache 03/28/2020  . 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  . Peripheral neuropathy 11/19/2019  . Renal insufficiency 06/18/2019  . Educated about COVID-19 virus infection 05/15/2019  . Atrophic vaginitis 01/20/2019  . Stenosis of carotid  artery 11/12/2018  . Anxiety 10/13/2018  . Thrush 12/07/2017  . Sinusitis 10/11/2017  . Headache 07/12/2017  . Dizzy spells 11/21/2016  . Constipation 11/21/2016  . Nonrheumatic aortic valve stenosis 05/23/2016  . Aortic atherosclerosis ([place]) 05/23/2016  . Hematuria 03/16/2016  . 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 Pain of upper abdomen 06/06/2015  . Neck pain 04/22/2015  . Ear pain 02/28/2015  . Abnormal urine 11/21/2014  . 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 Anemia 06/08/2014  . RLS (restless legs syndrome) 11/23/2013  . Diabetic peripheral neuropathy ([place]) 10/29/2013  . Left-sided thoracic back pain 10/06/2013  . Encounter for preventative adult health care exam with abnormal findings 09/14/2013  . Iron deficiency anemia   . Status post laparoscopy 02/25/2013  . Hyponatremia 01/09/2013  . GERD (gastroesophageal reflux disease) 01/09/2013  . Amaurosis fugax of left eye 10/16/2012  . Low back pain 06/03/2012  . Vitamin D deficiency 03/11/2012  . Bilateral hand pain 10/20/2011  . Encounter for long-term (current) use of other medications 10/20/2011  . IBS (irritable bowel syndrome) 08/14/2011  . TIA (transient ischemic attack) 02/10/2011  . Abnormal brain MRI 01/19/2011  . Allergic rhinitis 10/01/2010  . 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 FUNDIC GLAND POLYPS OF STOMACH 03/18/2010  . Abdominal pain in female 03/18/2010  . Pain in joint 03/17/2009  . SYSTOLIC MURMUR 25/06/3974  . Paroxysmal supraventricular tachycardia ([place]) 01/12/2009  . PLANTAR FASCIITIS 06/08/2008  . CHEST PAIN 05/18/2008  . Gastroparesis 12/18/2007  .  HYPERCHOLESTEROLEMIA 06/11/2007  . Diabetes mellitus type 2 in obese ([place]) 09/05/2006  . Essential hypertension 09/05/2006    Past Medical History:  Diagnosis Date  . Abdominal pain in female 03/18/2010   Qualifier: Diagnosis of  By: Carlean Purl MD, Dimas Millin Anemia 06/08/2014  . Anxiety   . Arthritis    Spinal  Osteoarthritis  . Cancer ([place])   . Carcinoid tumor of stomach   . Cataract   . Chest pain    Myoview 12/15 no ischemia.  . Chronic kidney disease    Left kidney smaller than right kidney  . Constipation 11/21/2016  . Diabetes mellitus type 2 in obese ([place]) 09/05/2006   Qualifier: Diagnosis of  By: Marca Ancona RMA, Lucy    . Diabetic peripheral neuropathy ([place]) 10/29/2013  . Encounter for preventative adult health care exam with abnormal findings 09/14/2013  . Esophageal reflux   . Gastric polyp    Fundic Gland  . Gastroparesis   . Headache(784.0)   . Heart murmur    Echocardiogram 2/11: EF 60-65%, mild LAE, grade 1 diastolic dysfunction, aortic valve sclerosis, mean gradient 9 mm of mercury, PASP 34  . Hematuria 03/16/2016  . Iron deficiency anemia, unspecified   . 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 Leg swelling    bilateral  . Neck pain 04/22/2015  . PONV (postoperative nausea and vomiting)    pt states only needs small amount of anesthesia  . PSVT (paroxysmal supraventricular tachycardia) ([place])   . Pure hypercholesterolemia   . 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 Renal insufficiency 06/18/2019  . Stroke ([place])    tia, 2014  . 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 Type II or unspecified type diabetes mellitus without mention of complication, not stated as uncontrolled   . Unspecified essential hypertension   . Unspecified hereditary and idiopathic peripheral neuropathy 10/29/2013    Past Surgical History:  Procedure Laterality Date  . CHOLECYSTECTOMY  1993  . COLONOSCOPY  11/11/2010   diverticulosis  . DILATATION & CURRETTAGE/HYSTEROSCOPY WITH RESECTOCOPE N/A 02/25/2013   Procedure: Attempted hysteroscopy with uterine perforation;  Surgeon: Jamey Reas de Berton Lan, MD;  Location: [facility];  Service: Gynecology;  Laterality: N/A;  . ESOPHAGOGASTRODUODENOSCOPY  08/29/2010; 09/15/2010   Carcinoid tumor less than 1 cm in July 2012 not seen in August 2012 , gastritis, fundic gland polyps  .  ESOPHAGOGASTRODUODENOSCOPY  05/16/2011  . ESOPHAGOGASTRODUODENOSCOPY  06/14/2012  . EUS  12/15/2010   Procedure: UPPER ENDOSCOPIC ULTRASOUND (EUS) LINEAR;  Surgeon: Owens Loffler, MD;  Location: [facility];  Service: Endoscopy;  Laterality: N/A;  . EYE SURGERY Bilateral    Bi lateral cateracts and bi lateral laser  . LAPAROSCOPY N/A 02/25/2013   Procedure: Cystoscopy and laparoscopy with fulguration of uterine serosa;  Surgeon: Jamey Reas de Berton Lan, MD;  Location: [facility];  Service: Gynecology;  Laterality: N/A;  . TONSILLECTOMY      Social History   Tobacco Use  . Smoking status: Never Smoker  . Smokeless tobacco: Never Used  . Tobacco comment: Never used tobacco  Vaping Use  . Vaping Use: Never used  Substance Use Topics  . Alcohol use: No    Alcohol/week: 0.0 standard drinks  . Drug use: No    Family History  Problem Relation Age of Onset  . Diabetes Mother   . Stroke Father        deceased age 66  . Heart disease Sister        deceased  MI age 33  . Heart disease Brother        deceased MI age 21  . Diabetes Maternal Grandmother   . Hypertension Paternal Grandmother   . Diabetes Sister   . Heart disease Sister   . Hypertension Sister   . Hyperlipidemia Sister   . Diabetes Sister   . Heart disease Sister   . Hypertension Sister   . Hyperlipidemia Sister   . Diabetes Brother   . Heart disease Brother   . Hypertension Brother   . Hyperlipidemia Brother   . Colon cancer Neg Hx   . Esophageal cancer Neg Hx   . Stomach cancer Neg Hx   . Rectal cancer Neg Hx     Allergies  Allergen Reactions  . Tramadol Other (See Comments)    Dizziness     Medication list has been reviewed and updated.  Current Outpatient Medications on File Prior to Visit  Medication Sig Dispense Refill  . amLODipine (NORVASC) 5 MG tablet TAKE 1 TABLET BY MOUTH  DAILY 90 tablet 3  . betamethasone valerate ointment (VALISONE) 0.1 % Apply qod to affected area, small amount 45 g  1  . Blood Glucose Monitoring Suppl (ONETOUCH ULTRALINK) w/Device KIT Use to check blood sugars BID E11.69 1 kit 0  . cholecalciferol (VITAMIN D) 1000 UNITS tablet Take 1,000 Units by mouth daily.    Marland Kitchen dicyclomine (BENTYL) 10 MG capsule TAKE ONE CAPSULE BY MOUTH 4 TIMES DAILY AS NEEDED 120 capsule 1  . esomeprazole (NEXIUM) 20 MG capsule Take 1 capsule (20 mg total) by mouth daily at 12 noon.    Marland Kitchen glucose blood (ONETOUCH ULTRA) test strip CHECK BLOOD SUGAR 3 TIMES  DAILY OR AS NEEDED. 300 strip 3  . hydrALAZINE (APRESOLINE) 10 MG tablet Take 10 mg by mouth as needed.    . Lancets (ONETOUCH DELICA PLUS [id number]) MISC Use as directed 3 times a day.  DX code: E11.69 300 each 1  . losartan (COZAAR) 50 MG tablet TAKE 1 TABLET BY MOUTH  TWICE DAILY 180 tablet 3  . metFORMIN (GLUCOPHAGE-XR) 500 MG 24 hr tablet Take 1 tablet (500 mg total) by mouth daily. 180 tablet 1  . metoCLOPramide (REGLAN) 5 MG tablet TAKE 1 TABLET BY MOUTH  TWICE DAILY 180 tablet 0  . metoprolol tartrate (LOPRESSOR) 50 MG tablet TAKE 1 AND 1/2 TABLETS BY  MOUTH TWICE DAILY 270 tablet 3  . ondansetron (ZOFRAN) 4 MG tablet Take 4 mg by mouth every 4 (four) hours as needed. For nausea    . potassium chloride (KLOR-CON) 10 MEQ tablet TAKE 1 TABLET BY MOUTH  DAILY 90 tablet 3  . rosuvastatin (CRESTOR) 20 MG tablet TAKE 1 TABLET BY MOUTH  DAILY 90 tablet 3   No current facility-administered medications on file prior to visit.    Review of Systems:  As per HPI- otherwise negative.   Physical Examination: There were no vitals filed for this visit. There were no vitals filed for this visit. There is no height or weight on file to calculate BMI. Ideal Body Weight:    GEN: no acute distress. HEENT: Atraumatic, Normocephalic.  Ears and Nose: No external deformity. CV: RRR, No M/G/R. No JVD. No thrill. No extra heart sounds. PULM: CTA B, no wheezes, crackles, rhonchi. No retractions. No resp. distress. No accessory muscle  use. ABD: S, NT, ND, +BS. No rebound. No HSM. EXTR: No c/c/e PSYCH: Normally interactive. Conversant.    Assessment and Plan: *** This visit occurred  during the SARS-CoV-2 public health emergency.  Safety protocols were in place, including screening questions prior to the visit, additional usage of staff PPE, and extensive cleaning of exam room while observing appropriate contact time as indicated for disinfecting solutions.    Signed Lamar Blinks, MD

## 2020-05-26 ENCOUNTER — Ambulatory Visit: Payer: Medicare Other | Admitting: Internal Medicine

## 2020-05-26 ENCOUNTER — Encounter: Payer: Self-pay | Admitting: Internal Medicine

## 2020-05-26 VITALS — BP 110/60 | HR 62 | Ht 61.0 in | Wt 167.0 lb

## 2020-05-26 DIAGNOSIS — D509 Iron deficiency anemia, unspecified: Secondary | ICD-10-CM

## 2020-05-26 DIAGNOSIS — R1010 Upper abdominal pain, unspecified: Secondary | ICD-10-CM

## 2020-05-26 DIAGNOSIS — K30 Functional dyspepsia: Secondary | ICD-10-CM | POA: Diagnosis not present

## 2020-05-26 DIAGNOSIS — K3184 Gastroparesis: Secondary | ICD-10-CM | POA: Diagnosis not present

## 2020-05-26 DIAGNOSIS — K581 Irritable bowel syndrome with constipation: Secondary | ICD-10-CM

## 2020-05-26 MED ORDER — NEXIUM 40 MG PO CPDR: 40.0000 mg | DELAYED_RELEASE_CAPSULE | Freq: Every day | ORAL | 3 refills | Status: DC

## 2020-05-26 NOTE — Patient Instructions (Signed)
You have been given a testing kit to check for small intestine bacterial overgrowth (SIBO) which is completed by a company named [facility]. Make sure to return your test in the mail using the return mailing label given to you along with the kit. Your demographic and insurance information have already been sent to the company and they should be in contact with you over the next week regarding this test. [facility] will collect an upfront charge of $99.74 for commercial insurance plans and $209.74 is you are paying cash. Make sure to discuss with [facility] PRIOR to having the test if they have gotten informatoin from your insurance company as to how much your testing will cost out of pocket, if any. Please keep in mind that you will be getting a call from phone number [phone] or a similar number. If you do not hear from them within this time frame, please call our office at [phone].   I appreciate the opportunity to care for you. Silvano Rusk, MD, [facility]

## 2020-05-26 NOTE — Addendum Note (Signed)
Addended by: [place], Jenin Birdsall E on: 05/26/2020 03:55 PM   Modules accepted: Orders

## 2020-05-26 NOTE — Progress Notes (Signed)
[name] 80 y.o. February 24, 1940 [id number]  Assessment & Plan:   Encounter Diagnoses  Name Primary?  Marland Kitchen Upper abdominal pain Yes  . Functional dyspepsia   . Gastroparesis   . Irritable bowel syndrome with constipation   . Iron deficiency anemia, unspecified iron deficiency anemia type    I think she could have small intestinal bacterial overgrowth.  We will test for that with a lactulose hydrogen breath test.  We showed her the video and reviewed the technique.  Once I see those results we will make treatment recommendations to determine follow-up.  Though I agree she may have malabsorption of iron related to chronic acid suppression she has not had a colonoscopy since 2012 and I think an EGD and colonoscopy are reasonable to assess her.  She is more open to an EGD than a colonoscopy.  We will revisit that pending the SIBO test.  I appreciate the opportunity to care for this patient. CC: [name], MD    Subjective:   Chief Complaint: Upper abdominal pain follow-up of dyspepsia polyps  HPI The patient is an elderly [place] woman with a history of IBS, carcinoid tumor of the stomach, gastric polyps mostly fundic gland polyps, gastroparesis diverticulosis and hemorrhage.  Presents saying several weeks ago she had a lot of upper abdominal pain and wants to know is that her stomach reset her colon.  She also says she has been reading about SIBO and wonders if she could have that.  She has been getting iron infusions through Dr. Marin Olp and Laverna Peace regarding iron deficiency which was thought probably due to malabsorption.  She has not had bleeding problems.  She feels okay today.  She still wonders about what she has.  She has been changing her eating and reducing sugars, she has reduced bread and other items as well and raw vegetables trying to reduce SIBO issues.  She has been drinking warm water every morning and at night she uses a combination of fennel,  chamomile tea [place] and mint and says she is move her bowels pretty regularly and is pleased overall with that.  She has not been vomiting that I know of.   Wt Readings from Last 3 Encounters:  05/26/20 167 lb (75.8 kg)  05/13/20 168 lb (76.2 kg)  03/25/20 169 lb (76.7 kg)   Social history, her sister is in [place] for the winter and will be home for another month or so so she would not have a driver for procedures for a while. Allergies  Allergen Reactions  . Tramadol Other (See Comments)    Dizziness    Current Meds  Medication Sig  . amLODipine (NORVASC) 5 MG tablet TAKE 1 TABLET BY MOUTH  DAILY  . betamethasone valerate ointment (VALISONE) 0.1 % Apply qod to affected area, small amount  . Blood Glucose Monitoring Suppl (ONETOUCH ULTRALINK) w/Device KIT Use to check blood sugars BID E11.69  . cholecalciferol (VITAMIN D) 1000 UNITS tablet Take 1,000 Units by mouth daily.  Marland Kitchen dicyclomine (BENTYL) 10 MG capsule TAKE ONE CAPSULE BY MOUTH 4 TIMES DAILY AS NEEDED  . esomeprazole (NEXIUM) 20 MG capsule Take 1 capsule (20 mg total) by mouth daily at 12 noon.  Marland Kitchen glucose blood (ONETOUCH ULTRA) test strip CHECK BLOOD SUGAR 3 TIMES  DAILY OR AS NEEDED.  . hydrALAZINE (APRESOLINE) 10 MG tablet Take 10 mg by mouth as needed.  . Lancets (ONETOUCH DELICA PLUS [id number]) MISC Use as directed 3 times a day.  DX code: E11.69  . losartan (COZAAR) 50 MG tablet TAKE 1 TABLET BY MOUTH  TWICE DAILY  . metFORMIN (GLUCOPHAGE-XR) 500 MG 24 hr tablet Take 1 tablet (500 mg total) by mouth daily.  . metoCLOPramide (REGLAN) 5 MG tablet TAKE 1 TABLET BY MOUTH  TWICE DAILY  . metoprolol tartrate (LOPRESSOR) 50 MG tablet TAKE 1 AND 1/2 TABLETS BY  MOUTH TWICE DAILY  . ondansetron (ZOFRAN) 4 MG tablet Take 4 mg by mouth every 4 (four) hours as needed. For nausea  . potassium chloride (KLOR-CON) 10 MEQ tablet TAKE 1 TABLET BY MOUTH  DAILY  . rosuvastatin (CRESTOR) 20 MG tablet TAKE 1 TABLET BY MOUTH  DAILY   Past  Medical History:  Diagnosis Date  . Abdominal pain in female 03/18/2010   Qualifier: Diagnosis of  By: Carlean Purl MD, Dimas Millin Anemia 06/08/2014  . Anxiety   . Arthritis    Spinal Osteoarthritis  . Cancer ([place])   . Carcinoid tumor of stomach   . Cataract   . Chest pain    Myoview 12/15 no ischemia.  . Chronic kidney disease    Left kidney smaller than right kidney  . Constipation 11/21/2016  . Diabetes mellitus type 2 in obese ([place]) 09/05/2006   Qualifier: Diagnosis of  By: Marca Ancona RMA, Lucy    . Diabetic peripheral neuropathy ([place]) 10/29/2013  . Encounter for preventative adult health care exam with abnormal findings 09/14/2013  . Esophageal reflux   . Gastric polyp    Fundic Gland  . Gastroparesis   . Headache(784.0)   . Heart murmur    Echocardiogram 2/11: EF 60-65%, mild LAE, grade 1 diastolic dysfunction, aortic valve sclerosis, mean gradient 9 mm of mercury, PASP 34  . Hematuria 03/16/2016  . Iron deficiency anemia, unspecified   . 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 Leg swelling    bilateral  . Neck pain 04/22/2015  . PONV (postoperative nausea and vomiting)    pt states only needs small amount of anesthesia  . PSVT (paroxysmal supraventricular tachycardia) ([place])   . Pure hypercholesterolemia   . 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 Renal insufficiency 06/18/2019  . Stroke ([place])    tia, 2014  . 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 Type II or unspecified type diabetes mellitus without mention of complication, not stated as uncontrolled   . Unspecified essential hypertension   . Unspecified hereditary and idiopathic peripheral neuropathy 10/29/2013   Past Surgical History:  Procedure Laterality Date  . CHOLECYSTECTOMY  1993  . COLONOSCOPY  11/11/2010   diverticulosis  . DILATATION & CURRETTAGE/HYSTEROSCOPY WITH RESECTOCOPE N/A 02/25/2013   Procedure: Attempted hysteroscopy with uterine perforation;  Surgeon: Jamey Reas de Berton Lan, MD;  Location: [facility];  Service: Gynecology;   Laterality: N/A;  . ESOPHAGOGASTRODUODENOSCOPY  08/29/2010; 09/15/2010   Carcinoid tumor less than 1 cm in July 2012 not seen in August 2012 , gastritis, fundic gland polyps  . ESOPHAGOGASTRODUODENOSCOPY  05/16/2011  . ESOPHAGOGASTRODUODENOSCOPY  06/14/2012  . EUS  12/15/2010   Procedure: UPPER ENDOSCOPIC ULTRASOUND (EUS) LINEAR;  Surgeon: Owens Loffler, MD;  Location: [facility];  Service: Endoscopy;  Laterality: N/A;  . EYE SURGERY Bilateral    Bi lateral cateracts and bi lateral laser  . LAPAROSCOPY N/A 02/25/2013   Procedure: Cystoscopy and laparoscopy with fulguration of uterine serosa;  Surgeon: Jamey Reas de Berton Lan, MD;  Location: [facility];  Service: Gynecology;  Laterality: N/A;  . 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   Patient is right-handed.   Patient has a BA degree.   One caffeine drink daily    family history includes Diabetes in her brother, maternal grandmother, mother, sister, and sister; Heart disease in her brother, brother, sister, sister, and sister; Hyperlipidemia in her brother, sister, and sister; Hypertension in her brother, paternal grandmother, sister, and sister; Stroke in her father.   Review of Systems  As above Objective:   Physical Exam BP 110/60   Pulse 62   Ht _0  (1.549 m)   Wt 167 lb (75.8 kg)   LMP 02/07/1992   SpO2 99%   BMI 31.55 kg/m  Well-developed elderly [place] woman in no acute distress Abdomen is soft mildly tender in the epigastrium as it usually is without organomegaly or mass.  Bowel sounds are present.  Data reviewed see HPI  33 minutes total time spent with the patient in this visit before during and after

## 2020-05-27 ENCOUNTER — Ambulatory Visit: Payer: Medicare Other | Admitting: Family Medicine

## 2020-05-27 ENCOUNTER — Encounter: Payer: Self-pay | Admitting: Neurology

## 2020-05-28 ENCOUNTER — Ambulatory Visit (HOSPITAL_COMMUNITY)
Admission: RE | Admit: 2020-05-28 | Discharge: 2020-05-28 | Disposition: A | Discharge status: Home / Self Care | Payer: Medicare Other | Source: Ambulatory Visit | Attending: Cardiovascular Disease | Admitting: Cardiovascular Disease

## 2020-05-28 ENCOUNTER — Other Ambulatory Visit: Payer: Self-pay

## 2020-05-28 DIAGNOSIS — Z8673 Personal history of transient ischemic attack (TIA), and cerebral infarction without residual deficits: Secondary | ICD-10-CM | POA: Diagnosis not present

## 2020-05-28 DIAGNOSIS — I6529 Occlusion and stenosis of unspecified carotid artery: Secondary | ICD-10-CM | POA: Diagnosis not present

## 2020-06-16 ENCOUNTER — Inpatient Hospital Stay (HOSPITAL_BASED_OUTPATIENT_CLINIC_OR_DEPARTMENT_OTHER): Payer: Medicare Other | Admitting: Family

## 2020-06-16 ENCOUNTER — Inpatient Hospital Stay: Payer: Medicare Other | Attending: Hematology & Oncology

## 2020-06-16 ENCOUNTER — Encounter: Payer: Self-pay | Admitting: Family

## 2020-06-16 VITALS — BP 129/52 | HR 64 | Resp 19 | Ht 61.0 in | Wt 167.1 lb

## 2020-06-16 DIAGNOSIS — D508 Other iron deficiency anemias: Secondary | ICD-10-CM

## 2020-06-16 DIAGNOSIS — G629 Polyneuropathy, unspecified: Secondary | ICD-10-CM | POA: Diagnosis not present

## 2020-06-16 DIAGNOSIS — D500 Iron deficiency anemia secondary to blood loss (chronic): Secondary | ICD-10-CM | POA: Diagnosis not present

## 2020-06-16 DIAGNOSIS — Z79899 Other long term (current) drug therapy: Secondary | ICD-10-CM | POA: Insufficient documentation

## 2020-06-16 DIAGNOSIS — K589 Irritable bowel syndrome without diarrhea: Secondary | ICD-10-CM | POA: Insufficient documentation

## 2020-06-16 DIAGNOSIS — K909 Intestinal malabsorption, unspecified: Secondary | ICD-10-CM | POA: Diagnosis not present

## 2020-06-16 LAB — CMP (CANCER CENTER ONLY)
ALT: 19 U/L (ref 0–44)
AST: 19 U/L (ref 15–41)
Albumin: 4.4 g/dL (ref 3.5–5.0)
Alkaline Phosphatase: 54 U/L (ref 38–126)
Anion gap: 9 (ref 5–15)
BUN: 19 mg/dL (ref 8–23)
CO2: 25 mmol/L (ref 22–32)
Calcium: 10.3 mg/dL (ref 8.9–10.3)
Chloride: 98 mmol/L (ref 98–111)
Creatinine: 0.76 mg/dL (ref 0.44–1.00)
GFR, Estimated: >60 mL/min (ref >60)
Glucose, Bld: 143 mg/dL — ABNORMAL HIGH (ref 70–99)
Potassium: 4.7 mmol/L (ref 3.5–5.1)
Sodium: 132 mmol/L — ABNORMAL LOW (ref 135–145)
Total Bilirubin: 0.5 mg/dL (ref 0.3–1.2)
Total Protein: 7 g/dL (ref 6.5–8.1)

## 2020-06-16 LAB — CBC WITH DIFFERENTIAL (CANCER CENTER ONLY)
Abs Immature Granulocytes: 0.02 10*3/uL (ref 0.00–0.07)
Basophils Absolute: 0.1 10*3/uL (ref 0.0–0.1)
Basophils Relative: 1 %
Eosinophils Absolute: 0.1 10*3/uL (ref 0.0–0.5)
Eosinophils Relative: 1 %
HCT: 36.6 % (ref 36.0–46.0)
Hemoglobin: 12.7 g/dL (ref 12.0–15.0)
Immature Granulocytes: 0 %
Lymphocytes Relative: 34 %
Lymphs Abs: 2.8 10*3/uL (ref 0.7–4.0)
MCH: 30 pg (ref 26.0–34.0)
MCHC: 34.7 g/dL (ref 30.0–36.0)
MCV: 86.5 fL (ref 80.0–100.0)
Monocytes Absolute: 0.7 10*3/uL (ref 0.1–1.0)
Monocytes Relative: 8 %
Neutro Abs: 4.7 10*3/uL (ref 1.7–7.7)
Neutrophils Relative %: 56 %
Platelet Count: 215 10*3/uL (ref 150–400)
RBC: 4.23 MIL/uL (ref 3.87–5.11)
RDW: 11.9 % (ref 11.5–15.5)
WBC Count: 8.3 10*3/uL (ref 4.0–10.5)
nRBC: 0 % (ref 0.0–0.2)

## 2020-06-16 LAB — RETICULOCYTES
Immature Retic Fract: 4.5 % (ref 2.3–15.9)
RBC.: 4.24 MIL/uL (ref 3.87–5.11)
Retic Count, Absolute: 67.8 10*3/uL (ref 19.0–186.0)
Retic Ct Pct: 1.6 % (ref 0.4–3.1)

## 2020-06-16 NOTE — Progress Notes (Signed)
Hematology and Oncology Follow Up Visit  [name] [id number] 1941/01/01 80 y.o. 06/16/2020   Principle Diagnosis:  Iron deficiency anemia secondary to malabsorption  Current Therapy: IV iron as indicated   Interim History:  [name] is here today for follow-up. She is having fatigue at times and occasional dizziness which she attributes to being a side effect from her daily medications.  No fever, chills, n/v, cough, rash, SOB, chest pain, palpitations, abdominal pain or changes in bowel or bladder habits.  She has history of GERD as well as IBS and sees GI [name]. She is due for endoscopy and colonoscopy this year and waiting to schedule.  No blood loss noted. No bruising or petechiae.  No swelling, tenderness in her extremities.  The neuropathy in her hands and feet is stable/unchanged.  No falls or syncope.  She has maintained a good appetite and is staying well hydrated. Her weight is stable.  Her Hgb A1c in February was 6.4.   ECOG Performance Status: 1 - Symptomatic but completely ambulatory  Medications:  Allergies as of 06/16/2020      Reactions   Tramadol Other (See Comments)   Dizziness      Medication List       Accurate as of Jun 16, 2020  1:37 PM. If you have any questions, ask your nurse or doctor.        amLODipine 5 MG tablet Commonly known as: NORVASC TAKE 1 TABLET BY MOUTH  DAILY   betamethasone valerate ointment 0.1 % Commonly known as: VALISONE Apply qod to affected area, small amount   cholecalciferol 1000 units tablet Commonly known as: VITAMIN D Take 1,000 Units by mouth daily.   dicyclomine 10 MG capsule Commonly known as: BENTYL TAKE ONE CAPSULE BY MOUTH 4 TIMES DAILY AS NEEDED   hydrALAZINE 10 MG tablet Commonly known as: APRESOLINE Take 10 mg by mouth as needed.   losartan 50 MG tablet Commonly known as: COZAAR TAKE 1 TABLET BY MOUTH  TWICE DAILY   metFORMIN 500 MG 24 hr tablet Commonly known as:  GLUCOPHAGE-XR Take 1 tablet (500 mg total) by mouth daily.   metoCLOPramide 5 MG tablet Commonly known as: REGLAN TAKE 1 TABLET BY MOUTH  TWICE DAILY   metoprolol tartrate 50 MG tablet Commonly known as: LOPRESSOR TAKE 1 AND 1/2 TABLETS BY  MOUTH TWICE DAILY   NexIUM 40 MG capsule Generic drug: esomeprazole Take 1 capsule (40 mg total) by mouth daily before breakfast.   ondansetron 4 MG tablet Commonly known as: ZOFRAN Take 4 mg by mouth every 4 (four) hours as needed. For nausea   OneTouch Delica Plus [id number] Misc Use as directed 3 times a day.  DX code: E11.69   OneTouch Ultra test strip Generic drug: glucose blood CHECK BLOOD SUGAR 3 TIMES  DAILY OR AS NEEDED.   OneTouch UltraLink w/Device Kit Use to check blood sugars BID E11.69   potassium chloride 10 MEQ tablet Commonly known as: KLOR-CON TAKE 1 TABLET BY MOUTH  DAILY   rosuvastatin 20 MG tablet Commonly known as: CRESTOR TAKE 1 TABLET BY MOUTH  DAILY       Allergies:  Allergies  Allergen Reactions  . Tramadol Other (See Comments)    Dizziness     Past Medical History, Surgical history, Social history, and Family History were reviewed and updated.  Review of Systems: All other 10 point review of systems is negative.   Physical Exam:  vitals were not taken for this  visit.   Wt Readings from Last 3 Encounters:  05/26/20 167 lb (75.8 kg)  05/13/20 168 lb (76.2 kg)  03/25/20 169 lb (76.7 kg)    Ocular: Sclerae unicteric, pupils equal, round and reactive to light Ear-nose-throat: Oropharynx clear, dentition fair Lymphatic: No cervical or supraclavicular adenopathy Lungs no rales or rhonchi, good excursion bilaterally Heart regular rate and rhythm, no murmur appreciated Abd soft, nontender, positive bowel sounds MSK no focal spinal tenderness, no joint edema Neuro: non-focal, well-oriented, appropriate affect Breasts: Deferred   Lab Results  Component Value Date   WBC 8.3 06/16/2020   HGB  12.7 06/16/2020   HCT 36.6 06/16/2020   MCV 86.5 06/16/2020   PLT 215 06/16/2020   Lab Results  Component Value Date   FERRITIN 379 (H) 03/19/2020   IRON 50 03/19/2020   TIBC 271 03/19/2020   UIBC 221 03/19/2020   IRONPCTSAT 19 (L) 03/19/2020   Lab Results  Component Value Date   RETICCTPCT 1.6 06/16/2020   RBC 4.23 06/16/2020   RETICCTABS 39.8 01/22/2015   No results found for: Nils Pyle [facility] Lab Results  Component Value Date   IGA 228 06/06/2011   No results found for: Odetta Pink, SPEI   Chemistry      Component Value Date/Time   NA 132 (L) 03/25/2020 1149   NA 136 05/08/2016 1305   NA 134 (L) 07/08/2015 1149   K 4.6 03/25/2020 1149   K 4.2 05/08/2016 1305   K 4.4 07/08/2015 1149   CL 96 03/25/2020 1149   CL 102 05/08/2016 1305   CO2 30 03/25/2020 1149   CO2 26 05/08/2016 1305   CO2 25 07/08/2015 1149   BUN 21 03/25/2020 1149   BUN 16 05/08/2016 1305   BUN 15.5 07/08/2015 1149   CREATININE 0.78 03/25/2020 1149   CREATININE 0.81 03/19/2020 1310   CREATININE 0.78 11/13/2019 0906   CREATININE 0.8 07/08/2015 1149      Component Value Date/Time   CALCIUM 10.4 03/25/2020 1149   CALCIUM 9.4 05/08/2016 1305   CALCIUM 10.0 07/08/2015 1149   ALKPHOS 53 03/25/2020 1149   ALKPHOS 56 05/08/2016 1305   ALKPHOS 62 07/08/2015 1149   AST 18 03/25/2020 1149   AST 14 (L) 03/19/2020 1310   AST 24 07/08/2015 1149   ALT 19 03/25/2020 1149   ALT 18 03/19/2020 1310   ALT 29 05/08/2016 1305   ALT 30 07/08/2015 1149   BILITOT 0.6 03/25/2020 1149   BILITOT 0.8 03/19/2020 1310   BILITOT 0.64 07/08/2015 1149       Impression and Plan: [name] is a very pleasant 80 yo female with history of iron deficiency anemia secondary to malabsorption. Iron studies are pending. We will replace if needed.  Follow-up in 3 months.  She can contact our office with any questions or concerns.   Valerie Peace, NP 5/11/20221:37 PM

## 2020-06-17 LAB — IRON AND TIBC
Iron: 67 ug/dL (ref 41–142)
Saturation Ratios: 22 % (ref 21–57)
TIBC: 308 ug/dL (ref 236–444)
UIBC: 240 ug/dL (ref 120–384)

## 2020-06-17 LAB — FERRITIN: Ferritin: 736 ng/mL — ABNORMAL HIGH (ref 11–307)

## 2020-06-21 ENCOUNTER — Telehealth: Payer: Self-pay | Admitting: *Deleted

## 2020-06-21 NOTE — Telephone Encounter (Signed)
Patient is stable and will wait till tomorrow.  Advised to go to ER if she has any worsening symptoms.

## 2020-06-21 NOTE — Telephone Encounter (Signed)
Patient has an appointment with you tomorrow, but do you want her to call her neurologist?

## 2020-06-21 NOTE — Telephone Encounter (Signed)
If her symptoms are stable, we can talk tomorrow. If they are worsening she may even have to go to the ER. She has  not seen neurology yet so I do not believe she needs to call them yet.

## 2020-06-21 NOTE — Telephone Encounter (Signed)
Who Is Calling Patient / Member / Family / Caregiver Call Type Triage / Clinical Relationship To Patient Self Return Phone Number [phone] (Primary) Chief Complaint NUMBNESS/TINGLING- sudden on one side of the body or face Reason for Call Symptomatic / Request for Health Information Initial Comment Caller states she has Sx of numbness on side of face and left hand. Translation No Nurse Assessment Nurse: Zorita Pang, RN, Deborah Date/Time (Eastern Time): 06/18/2020 10:33:12 AM Confirm and document reason for call. If symptomatic, describe symptoms. ---Caller states that she has facial numbness and her left hand that has been going on for a while. Has had CT and has scheduled to see a neurologist and not until end of June. States that she was told that the left side of her brain is not working right. She doesn't know what is going on in her head.

## 2020-06-22 ENCOUNTER — Other Ambulatory Visit: Payer: Self-pay

## 2020-06-22 ENCOUNTER — Other Ambulatory Visit: Payer: Self-pay | Admitting: Family Medicine

## 2020-06-22 ENCOUNTER — Telehealth: Payer: Self-pay | Admitting: Internal Medicine

## 2020-06-22 ENCOUNTER — Encounter: Payer: Self-pay | Admitting: Family Medicine

## 2020-06-22 ENCOUNTER — Ambulatory Visit (INDEPENDENT_AMBULATORY_CARE_PROVIDER_SITE_OTHER): Payer: Medicare Other | Admitting: Family Medicine

## 2020-06-22 ENCOUNTER — Other Ambulatory Visit: Payer: Self-pay | Admitting: Cardiology

## 2020-06-22 VITALS — BP 114/60 | HR 62 | Temp 98.1°F | Resp 16 | Wt 165.6 lb

## 2020-06-22 DIAGNOSIS — R519 Headache, unspecified: Secondary | ICD-10-CM

## 2020-06-22 DIAGNOSIS — N289 Disorder of kidney and ureter, unspecified: Secondary | ICD-10-CM

## 2020-06-22 DIAGNOSIS — E669 Obesity, unspecified: Secondary | ICD-10-CM

## 2020-06-22 DIAGNOSIS — E1169 Type 2 diabetes mellitus with other specified complication: Secondary | ICD-10-CM | POA: Diagnosis not present

## 2020-06-22 DIAGNOSIS — H9202 Otalgia, left ear: Secondary | ICD-10-CM

## 2020-06-22 DIAGNOSIS — E559 Vitamin D deficiency, unspecified: Secondary | ICD-10-CM

## 2020-06-22 DIAGNOSIS — I10 Essential (primary) hypertension: Secondary | ICD-10-CM

## 2020-06-22 DIAGNOSIS — E7800 Pure hypercholesterolemia, unspecified: Secondary | ICD-10-CM | POA: Diagnosis not present

## 2020-06-22 DIAGNOSIS — R700 Elevated erythrocyte sedimentation rate: Secondary | ICD-10-CM

## 2020-06-22 LAB — CBC WITH DIFFERENTIAL/PLATELET
Basophils Absolute: 0.1 10*3/uL (ref 0.0–0.1)
Basophils Relative: 1.2 % (ref 0.0–3.0)
Eosinophils Absolute: 0.1 10*3/uL (ref 0.0–0.7)
Eosinophils Relative: 0.9 % (ref 0.0–5.0)
HCT: 39 % (ref 36.0–46.0)
Hemoglobin: 13.1 g/dL (ref 12.0–15.0)
Lymphocytes Relative: 28 % (ref 12.0–46.0)
Lymphs Abs: 2.1 10*3/uL (ref 0.7–4.0)
MCHC: 33.6 g/dL (ref 30.0–36.0)
MCV: 88.1 fl (ref 78.0–100.0)
Monocytes Absolute: 0.7 10*3/uL (ref 0.1–1.0)
Monocytes Relative: 9.7 % (ref 3.0–12.0)
Neutro Abs: 4.6 10*3/uL (ref 1.4–7.7)
Neutrophils Relative %: 60.2 % (ref 43.0–77.0)
Platelets: 212 10*3/uL (ref 150.0–400.0)
RBC: 4.43 Mil/uL (ref 3.87–5.11)
RDW: 12.7 % (ref 11.5–15.5)
WBC: 7.6 10*3/uL (ref 4.0–10.5)

## 2020-06-22 LAB — HIGH SENSITIVITY CRP: CRP, High Sensitivity: 4.32 mg/L (ref 0.000–5.000)

## 2020-06-22 LAB — SEDIMENTATION RATE: Sed Rate: 39 mm/hr — ABNORMAL HIGH (ref 0–30)

## 2020-06-22 MED ORDER — PREDNISONE 20 MG PO TABS: ORAL_TABLET | ORAL | 0 refills | Status: DC

## 2020-06-22 NOTE — Assessment & Plan Note (Signed)
Supplement and monitor 

## 2020-06-22 NOTE — Assessment & Plan Note (Signed)
And tinnitus in left ear

## 2020-06-22 NOTE — Assessment & Plan Note (Signed)
Pain reports persistent pain above, behind, lateral to and below left eye. She has been evaluated by opthamology and they report her eye appears ok. Her sed rate has gone up today so we have to rule out temporal arteritis. She is referred to general surgery and she is started on a prednisone taper

## 2020-06-22 NOTE — Progress Notes (Signed)
[name], female    DOB: 1940/09/10  Age: 80 y.o. MRN: [id number]    Subjective:  Subjective  HPI [name] presents for office visit today for numbness of the left side of her face and her right arm and follow up on left eye pain. She reports that the numbness was initially waxing and waning then started to become more prominent and she states that she notices it more often at the moment. She denies any chest pain, SOB, fever, abdominal pain, cough, chills, sore throat, dysuria, urinary incontinence, back pain, HA, or N/VD. She reports that she is experiencing left ear pain and tinnitus that she states cannot pinpoint to either the left or right ear. She reports that her left eye pain is getting more persistent.   Review of Systems  Constitutional: Negative for chills, fatigue and fever.  HENT: Positive for ear pain (left) and tinnitus. Negative for congestion, rhinorrhea, sinus pressure, sinus pain and sore throat.   Eyes: Positive for pain (left). Negative for visual disturbance.  Respiratory: Negative for cough and shortness of breath.   Cardiovascular: Negative for chest pain, palpitations and leg swelling.  Gastrointestinal: Negative for abdominal pain, blood in stool, diarrhea, nausea and vomiting.  Genitourinary: Negative for decreased urine volume, flank pain, frequency, vaginal bleeding and vaginal discharge.  Musculoskeletal: Negative for back pain.  Neurological: Positive for light-headedness. Negative for headaches.    History Past Medical History:  Diagnosis Date  . Abdominal pain in female 03/18/2010   Qualifier: Diagnosis of  By: Carlean Purl MD, Dimas Millin Anemia 06/08/2014  . Anxiety   . Arthritis    Spinal Osteoarthritis  . Cancer ([place])   . Carcinoid tumor of stomach   . Cataract   . Chest pain    Myoview 12/15 no ischemia.  . Chronic kidney disease    Left kidney smaller than right kidney  . Constipation 11/21/2016  . Diabetes  mellitus type 2 in obese ([place]) 09/05/2006   Qualifier: Diagnosis of  By: Marca Ancona RMA, Lucy    . Diabetic peripheral neuropathy ([place]) 10/29/2013  . Encounter for preventative adult health care exam with abnormal findings 09/14/2013  . Esophageal reflux   . Gastric polyp    Fundic Gland  . Gastroparesis   . Headache(784.0)   . Heart murmur    Echocardiogram 2/11: EF 60-65%, mild LAE, grade 1 diastolic dysfunction, aortic valve sclerosis, mean gradient 9 mm of mercury, PASP 34  . Hematuria 03/16/2016  . Iron deficiency anemia, unspecified   . 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 Leg swelling    bilateral  . Neck pain 04/22/2015  . PONV (postoperative nausea and vomiting)    pt states only needs small amount of anesthesia  . PSVT (paroxysmal supraventricular tachycardia) ([place])   . Pure hypercholesterolemia   . 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 Renal insufficiency 06/18/2019  . Stroke ([place])    tia, 2014  . 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 Type II or unspecified type diabetes mellitus without mention of complication, not stated as uncontrolled   . Unspecified essential hypertension   . Unspecified hereditary and idiopathic peripheral neuropathy 10/29/2013    She has a past surgical history that includes Cholecystectomy (1993); Tonsillectomy; Colonoscopy (11/11/2010); Esophagogastroduodenoscopy (08/29/2010; 09/15/2010); EUS (12/15/2010); Esophagogastroduodenoscopy (05/16/2011); Esophagogastroduodenoscopy (06/14/2012); Dilatation & currettage/hysteroscopy with resectoscope (N/A, 02/25/2013); laparoscopy (N/A, 02/25/2013); and Eye surgery (Bilateral).   Her family history includes Diabetes in her brother, maternal grandmother, mother, sister, and sister; Heart disease  in her brother, brother, sister, sister, and sister; Hyperlipidemia in her brother, sister, and sister; Hypertension in her brother, paternal grandmother, sister, and sister; Stroke in her father.She reports that she has never smoked. She has never used smokeless  tobacco. She reports that she does not drink alcohol and does not use drugs.  Current Outpatient Medications on File Prior to Visit  Medication Sig Dispense Refill  . amLODipine (NORVASC) 5 MG tablet TAKE 1 TABLET BY MOUTH  DAILY 90 tablet 3  . betamethasone valerate ointment (VALISONE) 0.1 % Apply qod to affected area, small amount 45 g 1  . Blood Glucose Monitoring Suppl (ONETOUCH ULTRALINK) w/Device KIT Use to check blood sugars BID E11.69 1 kit 0  . cholecalciferol (VITAMIN D) 1000 UNITS tablet Take 1,000 Units by mouth daily.    Marland Kitchen dicyclomine (BENTYL) 10 MG capsule TAKE ONE CAPSULE BY MOUTH 4 TIMES DAILY AS NEEDED 120 capsule 1  . glucose blood (ONETOUCH ULTRA) test strip CHECK BLOOD SUGAR 3 TIMES  DAILY OR AS NEEDED. 300 strip 3  . hydrALAZINE (APRESOLINE) 10 MG tablet Take 10 mg by mouth as needed.    . Lancets (ONETOUCH DELICA PLUS JOINOM76H) MISC Use as directed 3 times a day.  DX code: E11.69 300 each 1  . losartan (COZAAR) 50 MG tablet TAKE 1 TABLET BY MOUTH  TWICE DAILY 180 tablet 3  . metFORMIN (GLUCOPHAGE-XR) 500 MG 24 hr tablet Take 1 tablet (500 mg total) by mouth daily. 180 tablet 1  . metoCLOPramide (REGLAN) 5 MG tablet TAKE 1 TABLET BY MOUTH  TWICE DAILY 180 tablet 0  . metoprolol tartrate (LOPRESSOR) 50 MG tablet TAKE 1 AND 1/2 TABLETS BY  MOUTH TWICE DAILY 270 tablet 3  . NEXIUM 40 MG capsule Take 1 capsule (40 mg total) by mouth daily before breakfast. 90 capsule 3  . ondansetron (ZOFRAN) 4 MG tablet Take 4 mg by mouth every 4 (four) hours as needed. For nausea    . potassium chloride (KLOR-CON) 10 MEQ tablet TAKE 1 TABLET BY MOUTH  DAILY 90 tablet 3  . rosuvastatin (CRESTOR) 20 MG tablet TAKE 1 TABLET BY MOUTH  DAILY 90 tablet 3   No current facility-administered medications on file prior to visit.     Objective:  Objective  Physical Exam Constitutional:      General: She is not in acute distress.    Appearance: Normal appearance. She is not ill-appearing or  toxic-appearing.  HENT:     Head: Normocephalic and atraumatic.     Right Ear: Tympanic membrane, ear canal and external ear normal.     Left Ear: Tympanic membrane, ear canal and external ear normal.     Nose: No congestion or rhinorrhea.  Eyes:     General:        Right eye: No discharge.        Left eye: No discharge.     Extraocular Movements: Extraocular movements intact.     Right eye: No nystagmus.     Left eye: No nystagmus.     Pupils: Pupils are equal, round, and reactive to light.  Cardiovascular:     Rate and Rhythm: Normal rate and regular rhythm.     Pulses: Normal pulses.     Heart sounds: Normal heart sounds. No murmur heard.   Pulmonary:     Effort: Pulmonary effort is normal. No respiratory distress.     Breath sounds: Normal breath sounds. No wheezing, rhonchi or rales.  Abdominal:  General: Bowel sounds are normal.     Palpations: Abdomen is soft. There is no mass.     Tenderness: There is no abdominal tenderness. There is no guarding.     Hernia: No hernia is present.  Musculoskeletal:        General: Normal range of motion.     Cervical back: Normal range of motion and neck supple.  Skin:    General: Skin is warm and dry.  Neurological:     Mental Status: She is alert and oriented to person, place, and time.  Psychiatric:        Behavior: Behavior normal.    BP 114/60   Pulse 62   Temp 98.1 F (36.7 C)   Resp 16   Wt 165 lb 9.6 oz (75.1 kg)   LMP 02/07/1992   SpO2 99%   BMI 31.29 kg/m  Wt Readings from Last 3 Encounters:  06/22/20 165 lb 9.6 oz (75.1 kg)  06/16/20 167 lb 1.9 oz (75.8 kg)  05/26/20 167 lb (75.8 kg)     Lab Results  Component Value Date   WBC 7.6 06/22/2020   HGB 13.1 06/22/2020   HCT 39.0 06/22/2020   PLT 212.0 06/22/2020   GLUCOSE 143 (H) 06/16/2020   CHOL 185 03/25/2020   TRIG 207.0 (H) 03/25/2020   HDL 61.70 03/25/2020   LDLDIRECT 95.0 03/25/2020   LDLCALC 116 (H) 11/13/2019   ALT 19 06/16/2020   AST 19  06/16/2020   NA 132 (L) 06/16/2020   K 4.7 06/16/2020   CL 98 06/16/2020   CREATININE 0.76 06/16/2020   BUN 19 06/16/2020   CO2 25 06/16/2020   TSH 1.87 03/25/2020   INR 0.88 01/16/2011   HGBA1C 6.4 03/25/2020   MICROALBUR 1.6 11/13/2014    VAS US CAROTID  Result Date: 05/28/2020 Carotid Arterial Duplex Study Patient Name:  JAMILLE FISHER  Date of Exam:   05/28/2020 Medical Rec #: [id number]            Accession #:    [id number] Date of Birth: September 21, 1940            Patient Gender: F Patient Age:   40Y Exam Location:  [facility] Procedure:      VAS US CAROTID Referring Phys: [id number] JAMES HOCHREIN --------------------------------------------------------------------------------  Indications:       Patient has had left temporal pain with tingling around her                    eye. She denies any other cerebrovascular symptoms today. Risk Factors:      Hypertension, hyperlipidemia, Diabetes, no history of                    smoking. Other Factors:     History of TIA. Comparison Study:  Prior carotid duplex on 07/17/2017 showed highest velocities                    in right mid ICA 66/17 cm/s and left mid ICA 75/24 cm/s. Performing Technologist: Salvadore Dom RVT, RDCS (AE), RDMS  Examination Guidelines: A complete evaluation includes B-mode imaging, spectral Doppler, color Doppler, and power Doppler as needed of all accessible portions of each vessel. Bilateral testing is considered an integral part of a complete examination. Limited examinations for reoccurring indications may be performed as noted.  Right Carotid Findings: +----------+--------+--------+--------+------------------+------------------+           PSV cm/sEDV cm/sStenosisPlaque DescriptionComments           +----------+--------+--------+--------+------------------+------------------+  CCA Prox  34      4                                                    +----------+--------+--------+--------+------------------+------------------+  CCA Distal46      8                                 intimal thickening +----------+--------+--------+--------+------------------+------------------+ ICA Prox  51      13                                                   +----------+--------+--------+--------+------------------+------------------+ ICA Mid   58      18                                                   +----------+--------+--------+--------+------------------+------------------+ ICA Distal73      20                                                   +----------+--------+--------+--------+------------------+------------------+ ECA       65      8                                                    +----------+--------+--------+--------+------------------+------------------+ +----------+--------+-------+----------------+-------------------+           PSV cm/sEDV cmsDescribe        Arm Pressure (mmHG) +----------+--------+-------+----------------+-------------------+ [id number]             Multiphasic, [id number]                 +----------+--------+-------+----------------+-------------------+ +---------+--------+--+--------+--+---------+ VertebralPSV cm/s52EDV cm/s13Antegrade +---------+--------+--+--------+--+---------+  Left Carotid Findings: +----------+--------+--------+--------+------------------+------------------+           PSV cm/sEDV cm/sStenosisPlaque DescriptionComments           +----------+--------+--------+--------+------------------+------------------+ CCA Prox  68      12                                                   +----------+--------+--------+--------+------------------+------------------+ CCA Distal46      9                                 intimal thickening +----------+--------+--------+--------+------------------+------------------+ ICA Prox  40      11              focal and calcific                    +----------+--------+--------+--------+------------------+------------------+ ICA Mid  63      18                                tortuous           +----------+--------+--------+--------+------------------+------------------+ ICA Distal58      16                                                   +----------+--------+--------+--------+------------------+------------------+ ECA       74      9                                                    +----------+--------+--------+--------+------------------+------------------+ +----------+--------+--------+----------------+-------------------+           PSV cm/sEDV cm/sDescribe        Arm Pressure (mmHG) +----------+--------+--------+----------------+-------------------+ [id number]              Multiphasic, [id number]                 +----------+--------+--------+----------------+-------------------+ +---------+--------+--+--------+-+---------+ VertebralPSV cm/s38EDV cm/s7Antegrade +---------+--------+--+--------+-+---------+   Summary: Right Carotid: The extracranial vessels were near-normal with only minimal wall                thickening or plaque. Left Carotid: The extracranial vessels were near-normal with only minimal wall               thickening or plaque. Vertebrals:  Bilateral vertebral arteries demonstrate antegrade flow. Subclavians: Normal flow hemodynamics were seen in bilateral subclavian              arteries. *See table(s) above for measurements and observations.  Electronically signed by Quay Burow MD on 05/28/2020 at 5:13:46 PM.    Final      Assessment & Plan:  Plan    No orders of the defined types were placed in this encounter.   Problem List Items Addressed This Visit    Diabetes mellitus type 2 in obese (HCC) (Chronic)    hgba1c acceptable, minimize simple carbs. Increase exercise as tolerated. Continue current meds      Essential hypertension (Chronic)    Well controlled, no changes to meds.  Encouraged heart healthy diet such as the DASH diet and exercise as tolerated.       HYPERCHOLESTEROLEMIA    Tolerating statin, encouraged heart healthy diet, avoid trans fats, minimize simple carbs and saturated fats. Increase exercise as tolerated      Vitamin D deficiency    Supplement and monitor      Ear pain    And tinnitus in left ear      Headache - Primary    Pain reports persistent pain above, behind, lateral to and below left eye. She has been evaluated by opthamology and they report her eye appears ok. Her sed rate has gone up today so we have to rule out temporal arteritis. She is referred to general surgery and she is started on a prednisone taper      Relevant Orders   MR Brain W Wo Contrast   Ambulatory referral to Neurology   Sedimentation rate (Completed)   CBC w/Diff (Completed)   CRP High sensitivity (Completed)  Renal insufficiency    Supplement and monitor         Follow-up: No follow-ups on file.   I,David Hanna,acting as a scribe for Penni Homans, MD.,have documented all relevant documentation on the behalf of Penni Homans, MD,as directed by  Penni Homans, MD while in the presence of Penni Homans, MD.  I, [name], MD personally performed the services described in this documentation. All medical record entries made by the scribe were at my direction and in my presence. I have reviewed the chart and agree that the record reflects my personal performance and is accurate and complete

## 2020-06-22 NOTE — Assessment & Plan Note (Signed)
Tolerating statin, encouraged heart healthy diet, avoid trans fats, minimize simple carbs and saturated fats. Increase exercise as tolerated 

## 2020-06-22 NOTE — Assessment & Plan Note (Signed)
hgba1c acceptable, minimize simple carbs. Increase exercise as tolerated. Continue current meds 

## 2020-06-22 NOTE — Telephone Encounter (Signed)
Patient notified she can return the SIBO test kit at her convenience.

## 2020-06-22 NOTE — Patient Instructions (Signed)
Headache, Adult A tension headache is a feeling of pain, pressure, or aching over the front and sides of the head. The pain can be dull, or it can feel tight. There are two types of tension headache:  Episodic tension headache. This is when the headaches happen fewer than 15 days a month.  Chronic tension headache. This is when the headaches happen more than 15 days a month during a 63-month period. A tension headache can last from 30 minutes to several days. It is the most common kind of headache. Tension headaches are not normally associated with nausea or vomiting, and they do not get worse with physical activity. What are the causes? The exact cause of this condition is not known. Tension headaches are often triggered by stress, anxiety, or depression. Other triggers may include:  Alcohol.  Too much caffeine or caffeine withdrawal.  Respiratory infections, such as colds, flu, or sinus infections.  Dental problems or teeth clenching.  Fatigue.  Holding your head and neck in the same position for a long period of time, such as while using a computer.  Smoking.  Arthritis of the neck. What are the signs or symptoms? Symptoms of this condition include:  A feeling of pressure or tightness around the head.  Dull, aching head pain.  Pain over the front and sides of the head.  Tenderness in the muscles of the head, neck, and shoulders. How is this diagnosed? This condition may be diagnosed based on your symptoms, your medical history, and a physical exam. If your symptoms are severe or unusual, you may have imaging tests, such as a CT scan or an MRI of your head. Your vision may also be checked. How is this treated? This condition may be treated with lifestyle changes and with medicines that help relieve symptoms. Follow these instructions at home: Managing pain  Take over-the-counter and prescription medicines only as told by your health care provider.  When you have a  headache, lie down in a dark, quiet room.  If directed, put ice on your head and neck. To do this: ? Put ice in a plastic bag. ? Place a towel between your skin and the bag. ? Leave the ice on for 20 minutes, 2-3 times a day. ? Remove the ice if your skin turns bright red. This is very important. If you cannot feel pain, heat, or cold, you have a greater risk of damage to the area.  If directed, apply heat to the back of your neck as often as told by your health care provider. Use the heat source that your health care provider recommends, such as a moist heat pack or a heating pad. ? Place a towel between your skin and the heat source. ? Leave the heat on for 20-30 minutes. ? Remove the heat if your skin turns bright red. This is especially important if you are unable to feel pain, heat, or cold. You have a greater risk of getting burned. Eating and drinking  Eat meals on a regular schedule.  If you drink alcohol: ? Limit how much you have to:  0-1 drink a day for women who are not pregnant.  0-2 drinks a day for men. ? Know how much alcohol is in your drink. In the [place] one drink equals one 12 oz bottle of beer (355 mL), one 5 oz glass of wine (148 mL), or one 1 oz glass of hard liquor (44 mL).  Drink enough fluid to keep your urine pale  yellow.  Decrease your caffeine intake, or stop using caffeine. Lifestyle  Get 7-9 hours of sleep each night, or get the amount of sleep recommended by your health care provider.  At bedtime, remove computers, phones, and tablets from your room.  Find ways to manage your stress. This may include: ? Exercise. ? Deep breathing exercises. ? Yoga. ? Listening to music. ? Positive mental imagery.  Try to sit up straight and avoid tensing your muscles.  Do not use any products that contain nicotine or tobacco. These include cigarettes, chewing tobacco, and vaping devices, such as e-cigarettes. If you need help quitting, ask your health care  provider. General instructions  Avoid any headache triggers. Keep a journal to help find out what may trigger your headaches. For example, write down: ? What you eat and drink. ? How much sleep you get. ? Any change to your diet or medicines.  Keep all follow-up visits. This is important.   Contact a health care provider if:  Your headache does not get better.  Your headache comes back.  You are sensitive to sounds, light, or smells because of a headache.  You have nausea or you vomit.  Your stomach hurts. Get help right away if:  You suddenly develop a severe headache, along with any of the following: ? A stiff neck. ? Nausea and vomiting. ? Confusion. ? Weakness in one part or one side of your body. ? Double vision or loss of vision. ? Shortness of breath. ? Rash. ? Unusual sleepiness. ? Fever or chills. ? Trouble speaking. ? Pain in your eye or ear. ? Trouble walking or balancing. ? Feeling faint or passing out. Summary  A tension headache is a feeling of pain, pressure, or aching over the front and sides of the head.  A tension headache can last from 30 minutes to several days. It is the most common kind of headache.  This condition may be diagnosed based on your symptoms, your medical history, and a physical exam.  This condition may be treated with lifestyle changes and with medicines that help relieve symptoms. This information is not intended to replace advice given to you by your health care provider. Make sure you discuss any questions you have with your health care provider. Document Revised: 10/23/2019 Document Reviewed: 10/23/2019 Elsevier Patient Education  2021 [employer].

## 2020-06-22 NOTE — Telephone Encounter (Signed)
Patient does wish to proceed with breath test at this time. She feels better with eliminating sugar and starches from her diet

## 2020-06-22 NOTE — Assessment & Plan Note (Signed)
Well controlled, no changes to meds. Encouraged heart healthy diet such as the DASH diet and exercise as tolerated.  °

## 2020-06-28 ENCOUNTER — Other Ambulatory Visit (INDEPENDENT_AMBULATORY_CARE_PROVIDER_SITE_OTHER): Payer: Medicare Other

## 2020-06-28 ENCOUNTER — Other Ambulatory Visit: Payer: Self-pay

## 2020-06-28 ENCOUNTER — Other Ambulatory Visit: Payer: Self-pay | Admitting: *Deleted

## 2020-06-28 DIAGNOSIS — G6289 Other specified polyneuropathies: Secondary | ICD-10-CM

## 2020-06-28 DIAGNOSIS — E559 Vitamin D deficiency, unspecified: Secondary | ICD-10-CM

## 2020-06-28 DIAGNOSIS — E669 Obesity, unspecified: Secondary | ICD-10-CM | POA: Diagnosis not present

## 2020-06-28 DIAGNOSIS — E7800 Pure hypercholesterolemia, unspecified: Secondary | ICD-10-CM

## 2020-06-28 DIAGNOSIS — E1169 Type 2 diabetes mellitus with other specified complication: Secondary | ICD-10-CM | POA: Diagnosis not present

## 2020-06-28 DIAGNOSIS — D538 Other specified nutritional anemias: Secondary | ICD-10-CM | POA: Diagnosis not present

## 2020-06-28 LAB — COMPREHENSIVE METABOLIC PANEL
ALT: 30 U/L (ref 0–35)
AST: 19 U/L (ref 0–37)
Albumin: 4.8 g/dL (ref 3.5–5.2)
Alkaline Phosphatase: 50 U/L (ref 39–117)
BUN: 22 mg/dL (ref 6–23)
CO2: 27 mEq/L (ref 19–32)
Calcium: 10.3 mg/dL (ref 8.4–10.5)
Chloride: 90 mEq/L — ABNORMAL LOW (ref 96–112)
Creatinine, Ser: 0.79 mg/dL (ref 0.40–1.20)
GFR: 71.23 mL/min (ref >60.00)
Glucose, Bld: 110 mg/dL — ABNORMAL HIGH (ref 70–99)
Potassium: 4 mEq/L (ref 3.5–5.1)
Sodium: 129 mEq/L — ABNORMAL LOW (ref 135–145)
Total Bilirubin: 0.9 mg/dL (ref 0.2–1.2)
Total Protein: 7.7 g/dL (ref 6.0–8.3)

## 2020-06-28 LAB — LIPID PANEL
Cholesterol: 213 mg/dL — ABNORMAL HIGH (ref 0–200)
HDL: 78.2 mg/dL (ref >39.00)
LDL Cholesterol: 109 mg/dL — ABNORMAL HIGH (ref 0–99)
NonHDL: 135.2
Total CHOL/HDL Ratio: 3
Triglycerides: 129 mg/dL (ref 0.0–149.0)
VLDL: 25.8 mg/dL (ref 0.0–40.0)

## 2020-06-28 LAB — CBC WITH DIFFERENTIAL/PLATELET
Basophils Absolute: 0 10*3/uL (ref 0.0–0.1)
Basophils Relative: 0.4 % (ref 0.0–3.0)
Eosinophils Absolute: 0 10*3/uL (ref 0.0–0.7)
Eosinophils Relative: 0.1 % (ref 0.0–5.0)
HCT: 39 % (ref 36.0–46.0)
Hemoglobin: 13.5 g/dL (ref 12.0–15.0)
Lymphocytes Relative: 38.2 % (ref 12.0–46.0)
Lymphs Abs: 5.4 10*3/uL — ABNORMAL HIGH (ref 0.7–4.0)
MCHC: 34.6 g/dL (ref 30.0–36.0)
MCV: 85.8 fl (ref 78.0–100.0)
Monocytes Absolute: 1.2 10*3/uL — ABNORMAL HIGH (ref 0.1–1.0)
Monocytes Relative: 8.4 % (ref 3.0–12.0)
Neutro Abs: 7.5 10*3/uL (ref 1.4–7.7)
Neutrophils Relative %: 52.9 % (ref 43.0–77.0)
Platelets: 287 10*3/uL (ref 150.0–400.0)
RBC: 4.54 Mil/uL (ref 3.87–5.11)
RDW: 12.5 % (ref 11.5–15.5)
WBC: 14.1 10*3/uL — ABNORMAL HIGH (ref 4.0–10.5)

## 2020-06-28 LAB — HEMOGLOBIN A1C: Hgb A1c MFr Bld: 6.6 % — ABNORMAL HIGH (ref 4.6–6.5)

## 2020-06-28 LAB — VITAMIN B12: Vitamin B-12: 240 pg/mL (ref 211–911)

## 2020-06-28 LAB — TSH: TSH: 1.71 u[IU]/mL (ref 0.35–4.50)

## 2020-06-28 LAB — VITAMIN D 25 HYDROXY (VIT D DEFICIENCY, FRACTURES): VITD: 48.29 ng/mL (ref 30.00–100.00)

## 2020-06-28 NOTE — Progress Notes (Signed)
06/28/20  Pt here for lab visit only. Released future orders with expected date 06/28/20 for: cbc, cmp, lipid, tsh, hgb a1c.  Pt asked if we were doing a vitamin b1 today.  Upon further chart review I see future orders from October with expected date 02/18/20. I cancelled the duplicate orders from that date and released a B1, B12 and Vitamin D to be done today as well.

## 2020-06-28 NOTE — Addendum Note (Signed)
Addended by: Kelle Darting A on: 06/28/2020 11:00 AM   Modules accepted: Orders

## 2020-07-01 LAB — VITAMIN B1: Vitamin B1 (Thiamine): 7 nmol/L — ABNORMAL LOW (ref 8–30)

## 2020-07-06 ENCOUNTER — Ambulatory Visit (INDEPENDENT_AMBULATORY_CARE_PROVIDER_SITE_OTHER): Payer: Medicare Other | Admitting: Family Medicine

## 2020-07-06 ENCOUNTER — Other Ambulatory Visit: Payer: Self-pay

## 2020-07-06 VITALS — BP 128/60 | HR 89 | Temp 98.0°F | Resp 12 | Ht 61.0 in | Wt 164.4 lb

## 2020-07-06 DIAGNOSIS — R700 Elevated erythrocyte sedimentation rate: Secondary | ICD-10-CM

## 2020-07-06 DIAGNOSIS — D538 Other specified nutritional anemias: Secondary | ICD-10-CM | POA: Diagnosis not present

## 2020-07-06 DIAGNOSIS — E1169 Type 2 diabetes mellitus with other specified complication: Secondary | ICD-10-CM

## 2020-07-06 DIAGNOSIS — E559 Vitamin D deficiency, unspecified: Secondary | ICD-10-CM

## 2020-07-06 DIAGNOSIS — N289 Disorder of kidney and ureter, unspecified: Secondary | ICD-10-CM

## 2020-07-06 DIAGNOSIS — D72829 Elevated white blood cell count, unspecified: Secondary | ICD-10-CM | POA: Diagnosis not present

## 2020-07-06 DIAGNOSIS — I10 Essential (primary) hypertension: Secondary | ICD-10-CM

## 2020-07-06 DIAGNOSIS — R519 Headache, unspecified: Secondary | ICD-10-CM | POA: Diagnosis not present

## 2020-07-06 DIAGNOSIS — E669 Obesity, unspecified: Secondary | ICD-10-CM

## 2020-07-06 MED ORDER — METFORMIN HCL ER 500 MG PO TB24: 1000.0000 mg | ORAL_TABLET | Freq: Two times a day (BID) | ORAL | 1 refills | Status: DC | PRN

## 2020-07-06 MED ORDER — ALPRAZOLAM 0.25 MG PO TABS: 0.2500 mg | ORAL_TABLET | Freq: Once | ORAL | 0 refills | Status: DC | PRN

## 2020-07-06 NOTE — Progress Notes (Signed)
[name], female    DOB: [birth year]  Age: 80 y.o. MRN: [id number]    Subjective:  Subjective  HPI [name] presents for office visit today for follow up on inflammation and numbness on the left side of her head. She reports that she is not feeling her best today and her symptoms of inflammation and numbness on her left side are not better. She denies any chest pain, SOB, fever, abdominal pain, cough, chills, sore throat, dysuria, urinary incontinence, back pain, or N/VD. She reports feeling left foot wet and burning sensation. She reports that her glucose levels within the last month ranged from the lowest 90 to her highest being 300.  Review of Systems  Constitutional: Positive for chills and diaphoresis. Negative for fatigue and fever.  HENT: Positive for ear pain (left). Negative for congestion, rhinorrhea, sinus pressure, sinus pain and sore throat.   Eyes: Positive for pain (left).  Respiratory: Negative for cough and shortness of breath.   Cardiovascular: Negative for chest pain, palpitations and leg swelling.  Gastrointestinal: Negative for abdominal pain, blood in stool, diarrhea, nausea and vomiting.  Genitourinary: Positive for dysuria. Negative for decreased urine volume, flank pain, frequency, vaginal bleeding and vaginal discharge.  Musculoskeletal: Negative for back pain.  Neurological: Negative for headaches.    History Past Medical History:  Diagnosis Date  . Abdominal pain in female 03/18/2010   Qualifier: Diagnosis of  By: Carlean Purl MD, Dimas Millin Anemia 06/08/2014  . Anxiety   . Arthritis    Spinal Osteoarthritis  . Cancer ([place])   . Carcinoid tumor of stomach   . Cataract   . Chest pain    Myoview 12/15 no ischemia.  . Chronic kidney disease    Left kidney smaller than right kidney  . Constipation 11/21/2016  . Diabetes mellitus type 2 in obese ([place]) 09/05/2006   Qualifier: Diagnosis of  By: Marca Ancona RMA, Lucy    . Diabetic  peripheral neuropathy ([place]) 10/29/2013  . Encounter for preventative adult health care exam with abnormal findings 09/14/2013  . Esophageal reflux   . Gastric polyp    Fundic Gland  . Gastroparesis   . Headache(784.0)   . Heart murmur    Echocardiogram 2/11: EF 60-65%, mild LAE, grade 1 diastolic dysfunction, aortic valve sclerosis, mean gradient 9 mm of mercury, PASP 34  . Hematuria 03/16/2016  . Iron deficiency anemia, unspecified   . 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 Leg swelling    bilateral  . Neck pain 04/22/2015  . PONV (postoperative nausea and vomiting)    pt states only needs small amount of anesthesia  . PSVT (paroxysmal supraventricular tachycardia) ([place])   . Pure hypercholesterolemia   . 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 Renal insufficiency 06/18/2019  . Stroke ([place])    tia, 2014  . 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 Type II or unspecified type diabetes mellitus without mention of complication, not stated as uncontrolled   . Unspecified essential hypertension   . Unspecified hereditary and idiopathic peripheral neuropathy 10/29/2013    She has a past surgical history that includes Cholecystectomy (1993); Tonsillectomy; Colonoscopy (11/11/2010); Esophagogastroduodenoscopy (08/29/2010; 09/15/2010); EUS (12/15/2010); Esophagogastroduodenoscopy (05/16/2011); Esophagogastroduodenoscopy (06/14/2012); Dilatation & currettage/hysteroscopy with resectoscope (N/A, 02/25/2013); laparoscopy (N/A, 02/25/2013); and Eye surgery (Bilateral).   Her family history includes Diabetes in her brother, maternal grandmother, mother, sister, and sister; Heart disease in her brother, brother, sister, sister, and sister; Hyperlipidemia in her brother, sister, and sister; Hypertension in her brother,  paternal grandmother, sister, and sister; Stroke in her father.She reports that she has never smoked. She has never used smokeless tobacco. She reports that she does not drink alcohol and does not use drugs.  Current Outpatient Medications  on File Prior to Visit  Medication Sig Dispense Refill  . amLODipine (NORVASC) 5 MG tablet TAKE 1 TABLET BY MOUTH  DAILY 90 tablet 3  . betamethasone valerate ointment (VALISONE) 0.1 % Apply qod to affected area, small amount 45 g 1  . Blood Glucose Monitoring Suppl (ONETOUCH ULTRALINK) w/Device KIT Use to check blood sugars BID E11.69 1 kit 0  . cholecalciferol (VITAMIN D) 1000 UNITS tablet Take 1,000 Units by mouth daily.    Marland Kitchen dicyclomine (BENTYL) 10 MG capsule TAKE ONE CAPSULE BY MOUTH 4 TIMES DAILY AS NEEDED 120 capsule 1  . glucose blood (ONETOUCH ULTRA) test strip CHECK BLOOD SUGAR 3 TIMES  DAILY OR AS NEEDED. 300 strip 3  . hydrALAZINE (APRESOLINE) 10 MG tablet Take 10 mg by mouth as needed.    . Lancets (ONETOUCH DELICA PLUS [id number]) MISC Use as directed 3 times a day.  DX code: E11.69 300 each 1  . losartan (COZAAR) 50 MG tablet TAKE 1 TABLET BY MOUTH  TWICE DAILY 180 tablet 3  . metoCLOPramide (REGLAN) 5 MG tablet TAKE 1 TABLET BY MOUTH  TWICE DAILY 180 tablet 0  . metoprolol tartrate (LOPRESSOR) 50 MG tablet TAKE 1 AND 1/2 TABLETS BY  MOUTH TWICE DAILY 270 tablet 3  . NEXIUM 40 MG capsule Take 1 capsule (40 mg total) by mouth daily before breakfast. 90 capsule 3  . ondansetron (ZOFRAN) 4 MG tablet Take 4 mg by mouth every 4 (four) hours as needed. For nausea    . potassium chloride (KLOR-CON) 10 MEQ tablet TAKE 1 TABLET BY MOUTH  DAILY 90 tablet 3  . predniSONE (DELTASONE) 20 MG tablet 40 mg po daily x 5 days then 30 mg daily x 5 days then 20 mg daily x 5 days then 1/2 tab po daily x 5 days 25 tablet 0  . rosuvastatin (CRESTOR) 20 MG tablet TAKE 1 TABLET BY MOUTH  DAILY 90 tablet 3   No current facility-administered medications on file prior to visit.     Objective:  Objective  Physical Exam Constitutional:      General: She is not in acute distress.    Appearance: Normal appearance. She is not ill-appearing or toxic-appearing.  HENT:     Head: Normocephalic and atraumatic.      Right Ear: Tympanic membrane, ear canal and external ear normal.     Left Ear: Tympanic membrane, ear canal and external ear normal.     Nose: No congestion or rhinorrhea.  Eyes:     Extraocular Movements: Extraocular movements intact.     Pupils: Pupils are equal, round, and reactive to light.  Cardiovascular:     Rate and Rhythm: Normal rate and regular rhythm.     Pulses: Normal pulses.     Heart sounds: Normal heart sounds. No murmur heard.   Pulmonary:     Effort: Pulmonary effort is normal. No respiratory distress.     Breath sounds: Normal breath sounds. No wheezing, rhonchi or rales.  Abdominal:     General: Bowel sounds are normal.     Palpations: Abdomen is soft. There is no mass.     Tenderness: There is no abdominal tenderness. There is no guarding.     Hernia: No hernia is present.  Musculoskeletal:  General: Normal range of motion.     Cervical back: Normal range of motion and neck supple.  Skin:    General: Skin is warm and dry.  Neurological:     Mental Status: She is alert and oriented to person, place, and time.  Psychiatric:        Behavior: Behavior normal.    BP 128/60 (BP Location: Left Arm, Cuff Size: Normal)   Pulse 89   Temp 98 F (36.7 C) (Oral)   Resp 12   Ht 5' 1"  (1.549 m)   Wt 164 lb 6.4 oz (74.6 kg)   LMP 02/07/1992   SpO2 99%   BMI 31.06 kg/m  Wt Readings from Last 3 Encounters:  07/06/20 164 lb 6.4 oz (74.6 kg)  06/22/20 165 lb 9.6 oz (75.1 kg)  06/16/20 167 lb 1.9 oz (75.8 kg)     Lab Results  Component Value Date   WBC 14.1 (H) 06/28/2020   HGB 13.5 06/28/2020   HCT 39.0 06/28/2020   PLT 287.0 06/28/2020   GLUCOSE 110 (H) 06/28/2020   CHOL 213 (H) 06/28/2020   TRIG 129.0 06/28/2020   HDL 78.20 06/28/2020   LDLDIRECT 95.0 03/25/2020   LDLCALC 109 (H) 06/28/2020   ALT 30 06/28/2020   AST 19 06/28/2020   NA 129 (L) 06/28/2020   K 4.0 06/28/2020   CL 90 (L) 06/28/2020   CREATININE 0.79 06/28/2020   BUN 22  06/28/2020   CO2 27 06/28/2020   TSH 1.71 06/28/2020   INR 0.88 01/16/2011   HGBA1C 6.6 (H) 06/28/2020   MICROALBUR 1.6 11/13/2014    VAS US CAROTID  Result Date: 05/28/2020 Carotid Arterial Duplex Study Patient Name:  [name]  Date of Exam:   05/28/2020 Medical Rec #: [id number]            Accession #:    [id number] Date of Birth: Nov 01, 1940            Patient Gender: F Patient Age:   32Y Exam Location:  [facility] Procedure:      VAS US CAROTID Referring Phys: [id number] JAMES HOCHREIN --------------------------------------------------------------------------------  Indications:       Patient has had left temporal pain with tingling around her                    eye. She denies any other cerebrovascular symptoms today. Risk Factors:      Hypertension, hyperlipidemia, Diabetes, no history of                    smoking. Other Factors:     History of TIA. Comparison Study:  Prior carotid duplex on 07/17/2017 showed highest velocities                    in right mid ICA 66/17 cm/s and left mid ICA 75/24 cm/s. Performing Technologist: Salvadore Dom RVT, RDCS (AE), RDMS  Examination Guidelines: A complete evaluation includes B-mode imaging, spectral Doppler, color Doppler, and power Doppler as needed of all accessible portions of each vessel. Bilateral testing is considered an integral part of a complete examination. Limited examinations for reoccurring indications may be performed as noted.  Right Carotid Findings: +----------+--------+--------+--------+------------------+------------------+           PSV cm/sEDV cm/sStenosisPlaque DescriptionComments           +----------+--------+--------+--------+------------------+------------------+ CCA Prox  34      4                                                    +----------+--------+--------+--------+------------------+------------------+  CCA Distal46      8                                 intimal thickening  +----------+--------+--------+--------+------------------+------------------+ ICA Prox  51      13                                                   +----------+--------+--------+--------+------------------+------------------+ ICA Mid   58      18                                                   +----------+--------+--------+--------+------------------+------------------+ ICA Distal73      20                                                   +----------+--------+--------+--------+------------------+------------------+ ECA       65      8                                                    +----------+--------+--------+--------+------------------+------------------+ +----------+--------+-------+----------------+-------------------+           PSV cm/sEDV cmsDescribe        Arm Pressure (mmHG) +----------+--------+-------+----------------+-------------------+ [id number]             Multiphasic, [id number]                 +----------+--------+-------+----------------+-------------------+ +---------+--------+--+--------+--+---------+ VertebralPSV cm/s52EDV cm/s13Antegrade +---------+--------+--+--------+--+---------+  Left Carotid Findings: +----------+--------+--------+--------+------------------+------------------+           PSV cm/sEDV cm/sStenosisPlaque DescriptionComments           +----------+--------+--------+--------+------------------+------------------+ CCA Prox  68      12                                                   +----------+--------+--------+--------+------------------+------------------+ CCA Distal46      9                                 intimal thickening +----------+--------+--------+--------+------------------+------------------+ ICA Prox  40      11              focal and calcific                   +----------+--------+--------+--------+------------------+------------------+ ICA Mid   63      18                                 tortuous           +----------+--------+--------+--------+------------------+------------------+ ICA Distal58      16                                                   +----------+--------+--------+--------+------------------+------------------+  ECA       74      9                                                    +----------+--------+--------+--------+------------------+------------------+ +----------+--------+--------+----------------+-------------------+           PSV cm/sEDV cm/sDescribe        Arm Pressure (mmHG) +----------+--------+--------+----------------+-------------------+ [id number]              Multiphasic, [id number]                 +----------+--------+--------+----------------+-------------------+ +---------+--------+--+--------+-+---------+ VertebralPSV cm/s38EDV cm/s7Antegrade +---------+--------+--+--------+-+---------+   Summary: Right Carotid: The extracranial vessels were near-normal with only minimal wall                thickening or plaque. Left Carotid: The extracranial vessels were near-normal with only minimal wall               thickening or plaque. Vertebrals:  Bilateral vertebral arteries demonstrate antegrade flow. Subclavians: Normal flow hemodynamics were seen in bilateral subclavian              arteries. *See table(s) above for measurements and observations.  Electronically signed by Quay Burow MD on 05/28/2020 at 5:13:46 PM.    Final      Assessment & Plan:  Plan    Meds ordered this encounter  Medications  . metFORMIN (GLUCOPHAGE-XR) 500 MG 24 hr tablet    Sig: Take 2 tablets (1,000 mg total) by mouth 2 (two) times daily as needed.    Dispense:  180 tablet    Refill:  1    Requesting 1 year supply  . ALPRAZolam (XANAX) 0.25 MG tablet    Sig: Take 1-3 tablets (0.25-0.75 mg total) by mouth once as needed for up to 1 dose for anxiety (MRI).    Dispense:  6 tablet    Refill:  0    Problem List Items Addressed This  Visit    Diabetes mellitus type 2 in obese ([facility]) (Chronic)    hgba1c acceptable, minimize simple carbs. Increase exercise as tolerated. Continue current meds. She notes a spike in sugar in evening to 250 to 300 and the 120s in evening. 300 is the highest and it came down. Lowest number this month was 90      Relevant Medications   metFORMIN (GLUCOPHAGE-XR) 500 MG 24 hr tablet   Essential hypertension - Primary (Chronic)    Well controlled, no changes to meds. Encouraged heart healthy diet such as the DASH diet and exercise as tolerated.       Relevant Orders   CBC w/Diff   Comprehensive metabolic panel   Sedimentation rate   Anemia (Chronic)   Vitamin D deficiency    Supplement and monitor      Headache    Notes facial pain, burning on left side of face over temple, forehead, maxillary region. Unclear etiology. She has been referred to general surgery for temporal biopsy due to elevation of sed rate. Have ordered reorder of sed rate today and she is tolerating her steroids. She has also been referred to neurology for further evaluation and an MRI of brain is pending      Renal insufficiency    Hydrate and monitor       Other Visit Diagnoses    Elevated sed rate  Relevant Orders   Sedimentation rate   Folate   Leukocytosis, unspecified type       Relevant Orders   CBC w/Diff   Folate      Follow-up: in roughly 6 weeks   I,David Hanna,acting as a scribe for Penni Homans, MD.,have documented all relevant documentation on the behalf of Penni Homans, MD,as directed by  Penni Homans, MD while in the presence of Penni Homans, MD.  I, [name], MD personally performed the services described in this documentation. All medical record entries made by the scribe were at my direction and in my presence. I have reviewed the chart and agree that the record reflects my personal performance and is accurate and complete

## 2020-07-06 NOTE — Assessment & Plan Note (Signed)
hgba1c acceptable, minimize simple carbs. Increase exercise as tolerated. Continue current meds. She notes a spike in sugar in evening to 250 to 300 and the 120s in evening. 300 is the highest and it came down. Lowest number this month was 90

## 2020-07-06 NOTE — Patient Instructions (Signed)
Call [facility] and make an appointment for a biopsy Temporal Arteritis  Temporal arteritis is a condition that causes arteries to become inflamed. It usually affects arteries in your head and face, but arteries in any part of the body can become inflamed. The condition is also called giant cell arteritis.  Temporal arteritis can cause serious problems such as blindness. Early treatment can help prevent these problems. What are the causes? The cause of this condition is not known. What increases the risk? The following factors may make you more likely to develop this condition:  Being older than 50.  Being a woman.  Being Caucasian.  Being of [place], [place], [place], [place] or [place] ancestry.  Having a family history of the condition.  Having a certain condition that causes muscle pain and stiffness (polymyalgia rheumatica, PMR). What are the signs or symptoms? Some people with temporal arteritis have just one symptom, while others have several symptoms. Most symptoms are related to the head and face. These may include:  Headache.  Hard or swollen temples. This is common. Your temples are the areas on either side of your forehead. If your temples are swollen, it may hurt to touch them.  Pain when combing your hair or when laying your head down.  Pain in the jaw when chewing.  Pain in the throat or tongue.  Problems with your vision, such as sudden loss of vision in one eye, or seeing double. Other symptoms may include:  Fever.  Tiredness (fatigue).  A dry cough.  Pain in the hips and shoulders.  Pain in the arms during exercise.  Depression.  Weight loss. How is this diagnosed? This condition may be diagnosed based on:  Your symptoms.  Your medical history.  A physical exam.  Tests, including: ? Blood tests. ? A test in which a tissue sample is removed from an artery so it can be examined (biopsy). ? Imaging tests, such as an  ultrasound or MRI. How is this treated? This condition may be treated with:  A type of medicine to reduce inflammation (corticosteroid).  Medicines to weaken your immune system (immunosuppressants).  Other medicines to treat vision problems. You will need to see your health care provider while you are being treated. The medicines used to treat this condition can increase your risk of problems such as bone loss and diabetes. During follow-up visits, your health care provider will check for problems by:  Doing blood tests and bone density tests.  Checking your blood pressure and blood sugar. Follow these instructions at home: Medicines  Take over-the-counter and prescription medicines only as told by your health care provider.  Take any vitamins or supplements recommended by your health care provider. These may include vitamin D and calcium, which help keep your bones from becoming weak. Eating and drinking  Eat a heart-healthy diet. This may include: ? Eating high-fiber foods, such as fresh fruits and vegetables, whole grains, and beans. ? Eating heart-healthy fats (omega-3 fats), such as fish, flaxseed, and flaxseed oil. ? Limiting foods that are high in saturated fat and cholesterol, such as processed and fried foods, fatty meat, and full-fat dairy. ? Limiting how much salt (sodium) you eat.  Include calcium and vitamin D in your diet. Good sources of calcium and vitamin D include: ? Low-fat dairy products such as milk, yogurt, and cheese. ? Certain fish, such as fresh or canned salmon, tuna, and sardines. ? Products that have calcium and vitamin D added to them (fortified  products), such as fortified cereals or juice.   General instructions  Exercise. Talk with your health care provider about what exercises are okay for you. Exercises that increase your heart rate (aerobic exercise), such as walking, are often recommended. Aerobic exercise helps control your blood pressure and  prevent bone loss.  Stay up to date on all vaccines as directed by your health care provider.  Keep all follow-up visits as told by your health care provider. This is important. Contact a health care provider if:  Your symptoms get worse.  You develop signs of infection, such as fever, swelling, redness, warmth, and tenderness. Get help right away if:  You lose your vision.  Your pain does not go away, even after you take medicine.  You have chest pain.  You have trouble breathing.  One side of your face or body suddenly becomes weak or numb. These symptoms may represent a serious problem that is an emergency. Do not wait to see if the symptoms will go away. Get medical help right away. Call your local emergency services (911 in the [place] Do not drive yourself to the hospital. Summary  Temporal arteritis is a condition that causes arteries to become inflamed. It usually affects arteries in your head and face.  This condition can cause serious problems, such as blindness. Treatment can help prevent these problems.  Symptoms may include hard or tender temples, pain in your jaw when chewing, problems with your vision, or pain in your hips and shoulders.  Take over-the-counter and prescription medicines as told by your health care provider. This information is not intended to replace advice given to you by your health care provider. Make sure you discuss any questions you have with your health care provider. Document Revised: 03/08/2017 Document Reviewed: 03/06/2017 Elsevier Patient Education  2021 [employer].

## 2020-07-07 ENCOUNTER — Telehealth: Payer: Self-pay | Admitting: Family Medicine

## 2020-07-07 ENCOUNTER — Other Ambulatory Visit: Payer: Self-pay | Admitting: Family Medicine

## 2020-07-07 LAB — CBC WITH DIFFERENTIAL/PLATELET
Basophils Absolute: 0 10*3/uL (ref 0.0–0.1)
Basophils Relative: 0.2 % (ref 0.0–3.0)
Eosinophils Absolute: 0.1 10*3/uL (ref 0.0–0.7)
Eosinophils Relative: 0.5 % (ref 0.0–5.0)
HCT: 39 % (ref 36.0–46.0)
Hemoglobin: 13.4 g/dL (ref 12.0–15.0)
Lymphocytes Relative: 29 % (ref 12.0–46.0)
Lymphs Abs: 4.8 10*3/uL — ABNORMAL HIGH (ref 0.7–4.0)
MCHC: 34.2 g/dL (ref 30.0–36.0)
MCV: 87 fl (ref 78.0–100.0)
Monocytes Absolute: 1.6 10*3/uL — ABNORMAL HIGH (ref 0.1–1.0)
Monocytes Relative: 9.7 % (ref 3.0–12.0)
Neutro Abs: 9.9 10*3/uL — ABNORMAL HIGH (ref 1.4–7.7)
Neutrophils Relative %: 60.6 % (ref 43.0–77.0)
Platelets: 259 10*3/uL (ref 150.0–400.0)
RBC: 4.48 Mil/uL (ref 3.87–5.11)
RDW: 12.9 % (ref 11.5–15.5)
WBC: 16.4 10*3/uL — ABNORMAL HIGH (ref 4.0–10.5)

## 2020-07-07 LAB — FOLATE: Folate: 9.9 ng/mL (ref >5.9)

## 2020-07-07 MED ORDER — CEFDINIR 300 MG PO CAPS: 300.0000 mg | ORAL_CAPSULE | Freq: Two times a day (BID) | ORAL | 0 refills | Status: AC

## 2020-07-07 NOTE — Telephone Encounter (Signed)
Done

## 2020-07-07 NOTE — Telephone Encounter (Signed)
Patient states she wants her MRI to go to [place]. They have bigger machines

## 2020-07-07 NOTE — Assessment & Plan Note (Signed)
Notes facial pain, burning on left side of face over temple, forehead, maxillary region. Unclear etiology. She has been referred to general surgery for temporal biopsy due to elevation of sed rate. Have ordered reorder of sed rate today and she is tolerating her steroids. She has also been referred to neurology for further evaluation and an MRI of brain is pending

## 2020-07-07 NOTE — Telephone Encounter (Signed)
See below

## 2020-07-07 NOTE — Assessment & Plan Note (Signed)
Well controlled, no changes to meds. Encouraged heart healthy diet such as the DASH diet and exercise as tolerated.  °

## 2020-07-07 NOTE — Assessment & Plan Note (Signed)
Supplement and monitor 

## 2020-07-07 NOTE — Assessment & Plan Note (Signed)
Hydrate and monitor 

## 2020-07-09 ENCOUNTER — Telehealth: Payer: Self-pay | Admitting: Internal Medicine

## 2020-07-09 NOTE — Telephone Encounter (Signed)
Inbound call from pt stating that she is having some pain and would like some advice on how to manage. Please give her a call. Thank you

## 2020-07-09 NOTE — Telephone Encounter (Signed)
Patient declined to perform the SIBO breath test.  Do you have additional recommendations for her abdominal pain?

## 2020-07-13 ENCOUNTER — Other Ambulatory Visit: Payer: Self-pay

## 2020-07-13 ENCOUNTER — Other Ambulatory Visit (INDEPENDENT_AMBULATORY_CARE_PROVIDER_SITE_OTHER): Payer: Medicare Other

## 2020-07-13 DIAGNOSIS — R700 Elevated erythrocyte sedimentation rate: Secondary | ICD-10-CM

## 2020-07-13 DIAGNOSIS — D72829 Elevated white blood cell count, unspecified: Secondary | ICD-10-CM | POA: Diagnosis not present

## 2020-07-13 DIAGNOSIS — I10 Essential (primary) hypertension: Secondary | ICD-10-CM

## 2020-07-14 LAB — COMPREHENSIVE METABOLIC PANEL
ALT: 35 U/L (ref 0–35)
AST: 20 U/L (ref 0–37)
Albumin: 4.5 g/dL (ref 3.5–5.2)
Alkaline Phosphatase: 46 U/L (ref 39–117)
BUN: 26 mg/dL — ABNORMAL HIGH (ref 6–23)
CO2: 26 mEq/L (ref 19–32)
Calcium: 9.6 mg/dL (ref 8.4–10.5)
Chloride: 89 mEq/L — ABNORMAL LOW (ref 96–112)
Creatinine, Ser: 0.83 mg/dL (ref 0.40–1.20)
GFR: 67.11 mL/min (ref >60.00)
Glucose, Bld: 115 mg/dL — ABNORMAL HIGH (ref 70–99)
Potassium: 4.8 mEq/L (ref 3.5–5.1)
Sodium: 125 mEq/L — ABNORMAL LOW (ref 135–145)
Total Bilirubin: 1 mg/dL (ref 0.2–1.2)
Total Protein: 6.9 g/dL (ref 6.0–8.3)

## 2020-07-14 LAB — CBC WITH DIFFERENTIAL/PLATELET
Basophils Absolute: 0.1 10*3/uL (ref 0.0–0.1)
Basophils Relative: 0.7 % (ref 0.0–3.0)
Eosinophils Absolute: 0.1 10*3/uL (ref 0.0–0.7)
Eosinophils Relative: 0.4 % (ref 0.0–5.0)
HCT: 37.8 % (ref 36.0–46.0)
Hemoglobin: 13.1 g/dL (ref 12.0–15.0)
Lymphocytes Relative: 31.2 % (ref 12.0–46.0)
Lymphs Abs: 3.8 10*3/uL (ref 0.7–4.0)
MCHC: 34.7 g/dL (ref 30.0–36.0)
MCV: 86.9 fl (ref 78.0–100.0)
Monocytes Absolute: 1.1 10*3/uL — ABNORMAL HIGH (ref 0.1–1.0)
Monocytes Relative: 9 % (ref 3.0–12.0)
Neutro Abs: 7.1 10*3/uL (ref 1.4–7.7)
Neutrophils Relative %: 58.7 % (ref 43.0–77.0)
Platelets: 197 10*3/uL (ref 150.0–400.0)
RBC: 4.36 Mil/uL (ref 3.87–5.11)
RDW: 12.7 % (ref 11.5–15.5)
WBC: 12.1 10*3/uL — ABNORMAL HIGH (ref 4.0–10.5)

## 2020-07-14 LAB — SEDIMENTATION RATE: Sed Rate: 11 mm/hr (ref 0–30)

## 2020-07-15 ENCOUNTER — Other Ambulatory Visit: Payer: Self-pay

## 2020-07-15 DIAGNOSIS — E870 Hyperosmolality and hypernatremia: Secondary | ICD-10-CM

## 2020-07-15 MED ORDER — FUROSEMIDE 20 MG PO TABS: 20.0000 mg | ORAL_TABLET | Freq: Every day | ORAL | 3 refills | Status: DC

## 2020-07-15 NOTE — Telephone Encounter (Signed)
I can call her next week - not sure what day

## 2020-07-15 NOTE — Telephone Encounter (Signed)
Left a message for the patient that Dr. Carlean Purl will call her one day next week

## 2020-07-16 DIAGNOSIS — Z961 Presence of intraocular lens: Secondary | ICD-10-CM | POA: Diagnosis not present

## 2020-07-16 DIAGNOSIS — H5712 Ocular pain, left eye: Secondary | ICD-10-CM | POA: Diagnosis not present

## 2020-07-16 DIAGNOSIS — H04123 Dry eye syndrome of bilateral lacrimal glands: Secondary | ICD-10-CM | POA: Diagnosis not present

## 2020-07-19 NOTE — Telephone Encounter (Signed)
I called her  She is wondering if the inflammation they found in her body (elevated sed rate mild leukocytosis) is coming from her gastrointestinal tract  She has a lot going on she may have temporal arteritis/PMR if she is hyponatremic and she is getting an MRI of the brain and is going to neurology and also there are plans for possible temporal artery biopsy  She has chronic GI disturbances outlined in other notes we will sit tight and wait and see what the rest of this work-up tells [place] before going forward  She also has thiamine deficiency and was started on supplementation, her B12 is normal but low normal and I would give some consideration to treating that as well

## 2020-07-20 ENCOUNTER — Encounter: Payer: Self-pay | Admitting: Family

## 2020-07-20 ENCOUNTER — Ambulatory Visit
Admission: RE | Admit: 2020-07-20 | Discharge: 2020-07-20 | Disposition: A | Discharge status: Home / Self Care | Payer: Medicare Other | Source: Ambulatory Visit | Attending: Family Medicine | Admitting: Family Medicine

## 2020-07-20 DIAGNOSIS — I639 Cerebral infarction, unspecified: Secondary | ICD-10-CM | POA: Diagnosis not present

## 2020-07-20 DIAGNOSIS — R519 Headache, unspecified: Secondary | ICD-10-CM

## 2020-07-20 MED ORDER — GADOBENATE DIMEGLUMINE 529 MG/ML IV SOLN
15.0000 mL | Freq: Once | INTRAVENOUS | Status: AC | PRN
  Administered 2020-07-20: 15 mL via INTRAVENOUS

## 2020-07-21 ENCOUNTER — Ambulatory Visit (HOSPITAL_COMMUNITY): Payer: Medicare Other

## 2020-07-21 ENCOUNTER — Telehealth: Payer: Self-pay | Admitting: Cardiology

## 2020-07-21 NOTE — Telephone Encounter (Signed)
pt is wanting Dr. Percival Spanish to know that an MRI was done yesterday determining that she had a stroke and fluid on her brain, she has stopped taking baby asprin per Dr. Cherlyn Cushing orders and would like to know if she could start back.

## 2020-07-21 NOTE — Telephone Encounter (Signed)
Returned call to patient-patient states she had MRI yesterday and this showed a stroke.  She states she use to be on aspirin 81 mg daily but was told she could stop.  She is wondering if she should restart this.   Advised would send message to Dr. Percival Spanish but may defer decision to neurologist who she is seeing 6/28.     Patient verbalized understanding.

## 2020-07-22 NOTE — Telephone Encounter (Signed)
Left message for patient with Dr Hochrein's recommendations.   

## 2020-07-27 ENCOUNTER — Telehealth: Payer: Self-pay | Admitting: Family Medicine

## 2020-07-27 ENCOUNTER — Other Ambulatory Visit: Payer: Self-pay

## 2020-07-27 ENCOUNTER — Telehealth: Payer: Self-pay | Admitting: *Deleted

## 2020-07-27 MED ORDER — ONETOUCH ULTRALINK W/DEVICE KIT: PACK | 0 refills | Status: DC

## 2020-07-27 NOTE — Telephone Encounter (Signed)
Pt states she contacted [facility] mail delivery and they informed her they would request the rx for her 1 touch Ultra 2 meter. Pt is trying to get confirmation if the request was sent, pt states she has no way to check her sugar. Please advise

## 2020-07-27 NOTE — Telephone Encounter (Signed)
Called and spoke to patient about rescheduling appointment from 09/17/20 to 09/21/20 - patient confirmed

## 2020-07-27 NOTE — Telephone Encounter (Signed)
Meter was reorder and sent to [facility]

## 2020-07-29 ENCOUNTER — Other Ambulatory Visit (INDEPENDENT_AMBULATORY_CARE_PROVIDER_SITE_OTHER): Payer: Medicare Other

## 2020-07-29 ENCOUNTER — Other Ambulatory Visit: Payer: Self-pay

## 2020-07-29 DIAGNOSIS — E870 Hyperosmolality and hypernatremia: Secondary | ICD-10-CM

## 2020-07-29 LAB — COMPREHENSIVE METABOLIC PANEL
ALT: 29 U/L (ref 0–35)
AST: 23 U/L (ref 0–37)
Albumin: 4.6 g/dL (ref 3.5–5.2)
Alkaline Phosphatase: 59 U/L (ref 39–117)
BUN: 19 mg/dL (ref 6–23)
CO2: 28 mEq/L (ref 19–32)
Calcium: 10 mg/dL (ref 8.4–10.5)
Chloride: 94 mEq/L — ABNORMAL LOW (ref 96–112)
Creatinine, Ser: 0.75 mg/dL (ref 0.40–1.20)
GFR: 75.77 mL/min (ref >60.00)
Glucose, Bld: 139 mg/dL — ABNORMAL HIGH (ref 70–99)
Potassium: 4.2 mEq/L (ref 3.5–5.1)
Sodium: 132 mEq/L — ABNORMAL LOW (ref 135–145)
Total Bilirubin: 0.8 mg/dL (ref 0.2–1.2)
Total Protein: 7 g/dL (ref 6.0–8.3)

## 2020-07-30 ENCOUNTER — Ambulatory Visit: Payer: Medicare Other | Admitting: Family

## 2020-08-02 NOTE — Progress Notes (Signed)
NEUROLOGY CONSULTATION NOTE  CARNESHIA RAKER MRN: [id number] DOB: [birth year]  Referring provider: Penni Homans, MD Primary care provider: Penni Homans, MD  Reason for consult:  headache  Assessment/Plan:    Paroxysmal left sided headache - given the borderline elevated sed rate with normal CRP, temporal arteritis felt less likely.  It would not explain the facial numbness and burning.  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 Left arm and leg numbness and burning - unclear etiology.  She has history of neck and back pain.  MRI of brain shows no explanation for these symptoms.  To further evaluate a cohesive diagnosis to explain both symptoms, will check MRI of cervical spine to evaluate for myelopathy.  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 MRI findings are incidental.  I do not suspect she is experiencing symptomatic increased intracranial pressure as these are stable findings going back to MRIs from 2012.  Eye exam reportedly negative for evidence of papilledema.  Also, unusual age for onset of idiopathic intracranial hypertension.   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  Again incidental findings. Left aural fullness and tinnitus  1.To treat neuropathic pain (face and arm), start gabapentin 113m titrating to 3077mat bedtime 2. Check MRI of cervical spine without contrast.  If unremarkable, would pursue NCV-EMG of left upper and lower extremities. 3. Given evidence of cerebrovascular disease on imaging, would recommend starting ASA 8166maily. 4.  Regarding left ear pressure and tinnitus, PCP may want to consider ENT referral. 5.  Follow up 6 months.  Further recommendations pending results.     Subjective:  [name] is a 80 81ar old right-handed female with DM II with  polyneuropathy, and history of carcinoid tumor of stomach who presents for headache.  She is accompanied by her sister.  History supplemented by referring provider's notes.  She reports paroxysmal shooting pain in the left temple traveling to the left eye with periorbital numbness.  Started in 2021 but worse since April 2022.  In addition, she reports aural fullness and tinnitus in the left ear, like insects or bells.  The periorbital numbness spread to involve left sided head and facial numbness and burning as well as numbness and burning in the left arm and leg.    Sed rate in May was mildly elevated at 39 with normal CRP of 4.320.  She was started on prednisone for referred for possible giant cell arteritis, but this was cancelled as repeat sed rate in June was 11.  Other labs included TSH 1.71, Hgb A1c 6.6, B12 240 and B1 7.  Pain has improved since prednisone taper.  The paroxysmal pain is less frequent, now 2 to 3 times a day as opposed to throughout the day.  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  She has history of neck pain which was aggravated after her MRI.  She notes pain radiating down the neck into the left shoulder.  She also reports history of low back pain and sciatica but the current left leg numbness is new.  She has had multiple exams by ophthalmology, which have been unremarkable.       PAST MEDICAL HISTORY: Past Medical History:  Diagnosis Date   Abdominal pain in female 03/18/2010   Qualifier: Diagnosis of  By: Carlean Purl MD, Dimas Millin    Anemia 06/08/2014   Anxiety    Arthritis    Spinal Osteoarthritis   Cancer ([place])    Carcinoid tumor of stomach    Cataract    Chest pain    Myoview 12/15 no ischemia.   Chronic kidney disease    Left kidney smaller  than right kidney   Constipation 11/21/2016   Diabetes mellitus type 2 in obese ([place]) 09/05/2006   Qualifier: Diagnosis of  By: Marca Ancona RMA, Lucy     Diabetic peripheral neuropathy ([place]) 10/29/2013   Encounter for preventative adult health care exam with abnormal findings 09/14/2013   Esophageal reflux    Gastric polyp    Fundic Gland   Gastroparesis    Headache(784.0)    Heart murmur    Echocardiogram 2/11: EF 60-65%, mild LAE, grade 1 diastolic dysfunction, aortic valve sclerosis, mean gradient 9 mm of mercury, PASP 34   Hematuria 03/16/2016   Iron deficiency anemia, unspecified    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   Leg swelling    bilateral   Neck pain 04/22/2015   PONV (postoperative nausea and vomiting)    pt states only needs small amount of anesthesia   PSVT (paroxysmal supraventricular tachycardia) ([place])    Pure hypercholesterolemia    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   Renal insufficiency 06/18/2019   Stroke ([place])    tia, 2014   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   Type II or unspecified type diabetes mellitus without mention of complication, not stated as uncontrolled    Unspecified essential hypertension    Unspecified hereditary and idiopathic peripheral neuropathy 10/29/2013    PAST SURGICAL HISTORY: Past Surgical History:  Procedure Laterality Date   CHOLECYSTECTOMY  1993   COLONOSCOPY  11/11/2010   diverticulosis   DILATATION & CURRETTAGE/HYSTEROSCOPY WITH RESECTOCOPE N/A 02/25/2013   Procedure: Attempted hysteroscopy with uterine perforation;  Surgeon: Jamey Reas de Berton Lan, MD;  Location: [facility];  Service: Gynecology;  Laterality: N/A;   ESOPHAGOGASTRODUODENOSCOPY  08/29/2010; 09/15/2010   Carcinoid tumor less than 1 cm in July 2012 not seen in August 2012 , gastritis, fundic gland polyps   ESOPHAGOGASTRODUODENOSCOPY  05/16/2011   ESOPHAGOGASTRODUODENOSCOPY  06/14/2012   EUS  12/15/2010   Procedure: UPPER ENDOSCOPIC ULTRASOUND (EUS) LINEAR;  Surgeon: Owens Loffler, MD;  Location: [facility];  Service: Endoscopy;  Laterality: N/A;   EYE SURGERY Bilateral    Bi lateral cateracts and bi lateral laser   LAPAROSCOPY N/A 02/25/2013   Procedure: Cystoscopy and laparoscopy with fulguration of uterine serosa;  Surgeon: Jamey Reas de Berton Lan, MD;  Location: [facility];  Service: Gynecology;  Laterality: N/A;   TONSILLECTOMY      MEDICATIONS: Current Outpatient Medications on File Prior to Visit  Medication Sig Dispense Refill   ALPRAZolam (XANAX) 0.25 MG tablet Take 1-3 tablets (0.25-0.75 mg total) by mouth once as needed for up to 1 dose for anxiety (MRI). 6 tablet 0   amLODipine (NORVASC) 5 MG tablet TAKE 1 TABLET BY MOUTH  DAILY 90 tablet 3   betamethasone valerate ointment (VALISONE) 0.1 % Apply qod to affected area, small amount 45 g 1   Blood Glucose Monitoring Suppl (ONETOUCH ULTRALINK) w/Device KIT Use to check blood sugars BID E11.69 1 kit 0   cholecalciferol (VITAMIN D) 1000 UNITS tablet Take 1,000 Units by mouth daily.     dicyclomine (BENTYL) 10  MG capsule TAKE ONE CAPSULE BY MOUTH 4 TIMES DAILY AS NEEDED 120 capsule 1   furosemide (LASIX) 20 MG tablet Take 1 tablet (20 mg total) by mouth daily. 30 tablet 3   glucose blood (ONETOUCH ULTRA) test strip CHECK BLOOD SUGAR 3 TIMES  DAILY OR AS NEEDED. 300 strip 3   hydrALAZINE (APRESOLINE) 10 MG tablet Take 10 mg by mouth as needed.     Lancets (ONETOUCH DELICA PLUS KZSWFU93A) MISC Use as directed 3 times a day.  DX code: E11.69 300 each 1   losartan (COZAAR) 50 MG tablet TAKE 1 TABLET BY MOUTH  TWICE DAILY 180 tablet 3   metFORMIN (GLUCOPHAGE-XR) 500 MG 24 hr tablet Take 2 tablets (1,000 mg total) by mouth 2 (two) times daily as needed. 180 tablet 1   metoCLOPramide (REGLAN) 5 MG tablet TAKE 1 TABLET BY MOUTH  TWICE DAILY 180 tablet 0   metoprolol tartrate (LOPRESSOR) 50 MG tablet TAKE 1 AND 1/2 TABLETS BY  MOUTH TWICE DAILY 270 tablet 3   NEXIUM 40 MG capsule Take 1 capsule (40 mg total) by mouth daily before  breakfast. 90 capsule 3   ondansetron (ZOFRAN) 4 MG tablet Take 4 mg by mouth every 4 (four) hours as needed. For nausea     potassium chloride (KLOR-CON) 10 MEQ tablet TAKE 1 TABLET BY MOUTH  DAILY 90 tablet 3   predniSONE (DELTASONE) 20 MG tablet 40 mg po daily x 5 days then 30 mg daily x 5 days then 20 mg daily x 5 days then 1/2 tab po daily x 5 days 25 tablet 0   rosuvastatin (CRESTOR) 20 MG tablet TAKE 1 TABLET BY MOUTH  DAILY 90 tablet 3   No current facility-administered medications on file prior to visit.    ALLERGIES: Allergies  Allergen Reactions   Tramadol Other (See Comments)    Dizziness     FAMILY HISTORY: Family History  Problem Relation Age of Onset   Diabetes Mother    Stroke Father        deceased age 90   Heart disease Sister        deceased MI age 67   Heart disease Brother        deceased MI age 29   Diabetes Maternal Grandmother    Hypertension Paternal Grandmother    Diabetes Sister    Heart disease Sister    Hypertension Sister    Hyperlipidemia Sister    Diabetes Sister    Heart disease Sister    Hypertension Sister    Hyperlipidemia Sister    Diabetes Brother    Heart disease Brother    Hypertension Brother    Hyperlipidemia Brother    Colon cancer Neg Hx    Esophageal cancer Neg Hx    Stomach cancer Neg Hx    Rectal cancer Neg Hx     Objective:  Blood pressure (!) 146/77, pulse 64, height 5' 1"  (1.549 m), weight 164 lb 3.2 oz (74.5 kg), last menstrual period 02/07/1992, SpO2 98 %. General: No acute distress.  Patient appears well-groomed.   Head:  Normocephalic/atraumatic Eyes:  fundi examined but not visualized Neck: supple, no paraspinal tenderness, full range of motion Back: No paraspinal tenderness Heart: regular rate and rhythm Lungs: Clear to auscultation bilaterally. Vascular: No carotid bruits. Neurological Exam: Mental status: alert and oriented to person, place, and time, recent and remote memory intact, fund of knowledge  intact, attention and concentration intact, speech fluent and not dysarthric, language intact.  Cranial nerves: CN I: not tested CN II: pupils equal, round and reactive to light, visual fields intact CN III, IV, VI:  full range of motion, no nystagmus, no ptosis CN V: facial sensation intact. CN VII: upper and lower face symmetric CN VIII: hearing intact CN IX, X: gag intact, uvula midline CN XI: sternocleidomastoid and trapezius muscles intact CN XII: tongue midline Bulk & Tone: normal, no fasciculations. Motor:  muscle strength 5/5 throughout Sensation:  Pinprick, temperature and vibratory sensation intact. Deep Tendon Reflexes:  2+ throughout,  toes downgoing.   Finger to nose testing:  Without dysmetria.   Heel to shin:  Without dysmetria.   Gait:  Normal station and stride.  Romberg negative.    Thank you for allowing me to take part in the care of this patient.  Metta Clines, DO  CC: Penni Homans, MD

## 2020-08-03 ENCOUNTER — Emergency Department (HOSPITAL_BASED_OUTPATIENT_CLINIC_OR_DEPARTMENT_OTHER)
Admission: EM | Admit: 2020-08-03 | Discharge: 2020-08-03 | Disposition: A | Discharge status: Home / Self Care | Payer: Medicare Other | Attending: Emergency Medicine | Admitting: Emergency Medicine

## 2020-08-03 ENCOUNTER — Encounter (HOSPITAL_BASED_OUTPATIENT_CLINIC_OR_DEPARTMENT_OTHER): Payer: Self-pay | Admitting: Emergency Medicine

## 2020-08-03 ENCOUNTER — Encounter: Payer: Self-pay | Admitting: Neurology

## 2020-08-03 ENCOUNTER — Other Ambulatory Visit: Payer: Self-pay

## 2020-08-03 ENCOUNTER — Ambulatory Visit: Payer: Medicare Other | Admitting: Neurology

## 2020-08-03 VITALS — BP 146/77 | HR 64 | Ht 61.0 in | Wt 164.2 lb

## 2020-08-03 DIAGNOSIS — E1142 Type 2 diabetes mellitus with diabetic polyneuropathy: Secondary | ICD-10-CM | POA: Insufficient documentation

## 2020-08-03 DIAGNOSIS — R208 Other disturbances of skin sensation: Secondary | ICD-10-CM | POA: Diagnosis not present

## 2020-08-03 DIAGNOSIS — I129 Hypertensive chronic kidney disease with stage 1 through stage 4 chronic kidney disease, or unspecified chronic kidney disease: Secondary | ICD-10-CM | POA: Diagnosis not present

## 2020-08-03 DIAGNOSIS — G500 Trigeminal neuralgia: Secondary | ICD-10-CM | POA: Diagnosis not present

## 2020-08-03 DIAGNOSIS — M25512 Pain in left shoulder: Secondary | ICD-10-CM | POA: Insufficient documentation

## 2020-08-03 DIAGNOSIS — G8929 Other chronic pain: Secondary | ICD-10-CM | POA: Insufficient documentation

## 2020-08-03 DIAGNOSIS — M542 Cervicalgia: Secondary | ICD-10-CM | POA: Insufficient documentation

## 2020-08-03 DIAGNOSIS — N189 Chronic kidney disease, unspecified: Secondary | ICD-10-CM | POA: Diagnosis not present

## 2020-08-03 DIAGNOSIS — E1122 Type 2 diabetes mellitus with diabetic chronic kidney disease: Secondary | ICD-10-CM | POA: Diagnosis not present

## 2020-08-03 DIAGNOSIS — H9312 Tinnitus, left ear: Secondary | ICD-10-CM

## 2020-08-03 DIAGNOSIS — Z79899 Other long term (current) drug therapy: Secondary | ICD-10-CM | POA: Diagnosis not present

## 2020-08-03 DIAGNOSIS — R29898 Other symptoms and signs involving the musculoskeletal system: Secondary | ICD-10-CM | POA: Diagnosis not present

## 2020-08-03 DIAGNOSIS — Z85020 Personal history of malignant carcinoid tumor of stomach: Secondary | ICD-10-CM | POA: Insufficient documentation

## 2020-08-03 DIAGNOSIS — I10 Essential (primary) hypertension: Secondary | ICD-10-CM | POA: Diagnosis not present

## 2020-08-03 DIAGNOSIS — M25519 Pain in unspecified shoulder: Secondary | ICD-10-CM | POA: Diagnosis not present

## 2020-08-03 DIAGNOSIS — M5412 Radiculopathy, cervical region: Secondary | ICD-10-CM

## 2020-08-03 DIAGNOSIS — R42 Dizziness and giddiness: Secondary | ICD-10-CM | POA: Insufficient documentation

## 2020-08-03 DIAGNOSIS — R200 Anesthesia of skin: Secondary | ICD-10-CM | POA: Diagnosis not present

## 2020-08-03 DIAGNOSIS — Z7984 Long term (current) use of oral hypoglycemic drugs: Secondary | ICD-10-CM | POA: Insufficient documentation

## 2020-08-03 MED ORDER — OXYCODONE HCL 5 MG PO TABS: 5.0000 mg | ORAL_TABLET | Freq: Three times a day (TID) | ORAL | 0 refills | Status: AC | PRN

## 2020-08-03 MED ORDER — ALPRAZOLAM 0.25 MG PO TABS: 0.2500 mg | ORAL_TABLET | Freq: Once | ORAL | 0 refills | Status: DC | PRN

## 2020-08-03 MED ORDER — ACETAMINOPHEN 500 MG PO TABS
1000.0000 mg | ORAL_TABLET | Freq: Once | ORAL | Status: AC
  Administered 2020-08-03: 1000 mg via ORAL
  Filled 2020-08-03: qty 2

## 2020-08-03 MED ORDER — GABAPENTIN 100 MG PO CAPS: ORAL_CAPSULE | ORAL | 0 refills | Status: DC

## 2020-08-03 MED ORDER — ACETAMINOPHEN 325 MG PO TABS: 650.0000 mg | ORAL_TABLET | Freq: Four times a day (QID) | ORAL | 0 refills | Status: AC | PRN

## 2020-08-03 NOTE — ED Triage Notes (Signed)
Arrives via EMS for exacerbation on chronic left shoulder/neck pain. Supposed to see neurology today for same and get MRI, called 911 instead.

## 2020-08-03 NOTE — ED Notes (Signed)
Declined to sign MSE, states "I wish I had stayed home."

## 2020-08-03 NOTE — ED Provider Notes (Signed)
[facility] EMERGENCY DEPARTMENT Provider Note   CSN: [id number] Arrival date & time: 08/03/20  2505     History Chief Complaint  Patient presents with   Shoulder Pain    [name] is a 80 y.o. female with history of diabetes, chronic kidney disease, chronic neck and shoulder pain, presented ED with pain in her left shoulder.  She says this been going on for months.  She came into the ED because "I cannot take it anymore".  She has a sharp pain that radiates from the left side of her neck to her left shoulder.  It is worse with movement.  She had an appointment with a neurologist today at 11:00 in the office.  However she called an ambulance to come to the ED because the pain was too intense.  She denies any new symptoms associated with this.  She denies any falls or trauma.  She takes Tylenol only for pain at home.  She reports in the past she has reacted poorly to prednisone.  She is not allowed to take Motrin due to a sensitive stomach.  She has tried tramadol in the past but this makes her dizzy.  HPI     Past Medical History:  Diagnosis Date   Abdominal pain in female 03/18/2010   Qualifier: Diagnosis of  By: Carlean Purl MD, Dimas Millin    Anemia 06/08/2014   Anxiety    Arthritis    Spinal Osteoarthritis   Cancer ([place])    Carcinoid tumor of stomach    Cataract    Chest pain    Myoview 12/15 no ischemia.   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 ([place]) 09/05/2006   Qualifier: Diagnosis of  By: Marca Ancona RMA, Lucy     Diabetic peripheral neuropathy ([place]) 10/29/2013   Encounter for preventative adult health care exam with abnormal findings 09/14/2013   Esophageal reflux    Gastric polyp    Fundic Gland   Gastroparesis    Headache(784.0)    Heart murmur    Echocardiogram 2/11: EF 60-65%, mild LAE, grade 1 diastolic dysfunction, aortic valve sclerosis, mean gradient 9 mm of  mercury, PASP 34   Hematuria 03/16/2016   Iron deficiency anemia, unspecified    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   Leg swelling    bilateral   Neck pain 04/22/2015   PONV (postoperative nausea and vomiting)    pt states only needs small amount of anesthesia   PSVT (paroxysmal supraventricular tachycardia) ([place])    Pure hypercholesterolemia    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   Renal insufficiency 06/18/2019   Stroke ([place])    tia, 2014   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   Type II or unspecified type diabetes mellitus without mention of complication, not stated as uncontrolled    Unspecified essential hypertension    Unspecified hereditary and idiopathic peripheral neuropathy 10/29/2013    Patient Active Problem List   Diagnosis Date Noted   Chronic left-sided back pain 03/28/2020   Nocturia 03/28/2020   Left-sided headache 03/28/2020   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   Peripheral neuropathy 11/19/2019   Renal insufficiency 06/18/2019   Educated about COVID-19 virus infection 05/15/2019   Atrophic vaginitis 01/20/2019   Stenosis of carotid artery 11/12/2018   Anxiety 10/13/2018   Thrush 12/07/2017   Sinusitis 10/11/2017   Headache 07/12/2017   Dizzy spells 11/21/2016   Constipation 11/21/2016   Nonrheumatic aortic  valve stenosis 05/23/2016   Aortic atherosclerosis ([facility]) 05/23/2016   Hematuria 03/16/2016   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   Pain of upper abdomen 06/06/2015   Neck pain 04/22/2015   Ear pain 02/28/2015   Abnormal urine 11/21/2014   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   Anemia 06/08/2014   RLS (restless legs syndrome) 11/23/2013   Diabetic peripheral neuropathy ([place]) 10/29/2013   Left-sided thoracic back pain 10/06/2013   Encounter for preventative adult health care exam with abnormal findings 09/14/2013   Iron deficiency anemia    Status post laparoscopy 02/25/2013   Hyponatremia 01/09/2013   GERD (gastroesophageal reflux disease) 01/09/2013   Amaurosis fugax of  left eye 10/16/2012   Low back pain 06/03/2012   Vitamin D deficiency 03/11/2012   Bilateral hand pain 10/20/2011   Encounter for long-term (current) use of other medications 10/20/2011   IBS (irritable bowel syndrome) 08/14/2011   TIA (transient ischemic attack) 02/10/2011   Abnormal brain MRI 01/19/2011   Allergic rhinitis 10/01/2010   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   FUNDIC GLAND POLYPS OF STOMACH 03/18/2010   Abdominal pain in female 03/18/2010   Pain in joint 37/10/6436   SYSTOLIC MURMUR 38/18/4037   Paroxysmal supraventricular tachycardia ([place]) 01/12/2009   PLANTAR FASCIITIS 06/08/2008   CHEST PAIN 05/18/2008   Gastroparesis 12/18/2007   HYPERCHOLESTEROLEMIA 06/11/2007   Diabetes mellitus type 2 in obese ([place]) 09/05/2006   Essential hypertension 09/05/2006    Past Surgical History:  Procedure Laterality Date   CHOLECYSTECTOMY  1993   COLONOSCOPY  11/11/2010   diverticulosis   DILATATION & CURRETTAGE/HYSTEROSCOPY WITH RESECTOCOPE N/A 02/25/2013   Procedure: Attempted hysteroscopy with uterine perforation;  Surgeon: Jamey Reas de Berton Lan, MD;  Location: [facility];  Service: Gynecology;  Laterality: N/A;   ESOPHAGOGASTRODUODENOSCOPY  08/29/2010; 09/15/2010   Carcinoid tumor less than 1 cm in July 2012 not seen in August 2012 , gastritis, fundic gland polyps   ESOPHAGOGASTRODUODENOSCOPY  05/16/2011   ESOPHAGOGASTRODUODENOSCOPY  06/14/2012   EUS  12/15/2010   Procedure: UPPER ENDOSCOPIC ULTRASOUND (EUS) LINEAR;  Surgeon: Owens Loffler, MD;  Location: [facility];  Service: Endoscopy;  Laterality: N/A;   EYE SURGERY Bilateral    Bi lateral cateracts and bi lateral laser   LAPAROSCOPY N/A 02/25/2013   Procedure: Cystoscopy and laparoscopy with fulguration of uterine serosa;  Surgeon: Jamey Reas de Berton Lan, MD;  Location: [facility];  Service: Gynecology;  Laterality: N/A;   TONSILLECTOMY       OB History     Gravida  0   Para      Term       Preterm      AB      Living         SAB      IAB      Ectopic      Multiple      Live Births              Family History  Problem Relation Age of Onset   Diabetes Mother    Stroke Father        deceased age 19   Heart disease Sister        deceased MI age 81   Heart disease Brother        deceased MI age 65   Diabetes Maternal Grandmother    Hypertension Paternal Grandmother    Diabetes Sister    Heart disease Sister  Hypertension Sister    Hyperlipidemia Sister    Diabetes Sister    Heart disease Sister    Hypertension Sister    Hyperlipidemia Sister    Diabetes Brother    Heart disease Brother    Hypertension Brother    Hyperlipidemia Brother    Colon cancer Neg Hx    Esophageal cancer Neg Hx    Stomach cancer Neg Hx    Rectal cancer Neg Hx     Social History   Tobacco Use   Smoking status: Never   Smokeless tobacco: Never   Tobacco comments:    Never used tobacco  Vaping Use   Vaping Use: Never used  Substance Use Topics   Alcohol use: No    Alcohol/week: 0.0 standard drinks   Drug use: No    Home Medications Prior to Admission medications   Medication Sig Start Date End Date Taking? Authorizing Provider  acetaminophen (TYLENOL) 325 MG tablet Take 2 tablets (650 mg total) by mouth every 6 (six) hours as needed for up to 30 doses for mild pain or moderate pain. 08/03/20  Yes Wyvonnia Dusky, MD  amLODipine (NORVASC) 5 MG tablet TAKE 1 TABLET BY MOUTH  DAILY 06/23/20  Yes Minus Breeding, MD  betamethasone valerate ointment (VALISONE) 0.1 % Apply qod to affected area, small amount 01/16/19  Yes [name], MD  cholecalciferol (VITAMIN D) 1000 UNITS tablet Take 1,000 Units by mouth daily.   Yes [provider]  dicyclomine (BENTYL) 10 MG capsule TAKE ONE CAPSULE BY MOUTH 4 TIMES DAILY AS NEEDED 08/27/19  Yes Gatha Mayer, MD  hydrALAZINE (APRESOLINE) 10 MG tablet Take 10 mg by mouth as needed.   Yes [provider]  losartan (COZAAR) 50 MG tablet TAKE 1 TABLET BY MOUTH  TWICE DAILY 01/09/20  Yes [name], MD  metFORMIN (GLUCOPHAGE-XR) 500 MG 24 hr tablet Take 2 tablets (1,000 mg total) by mouth 2 (two) times daily as needed. 07/06/20  Yes [name], MD  metoprolol tartrate (LOPRESSOR) 50 MG tablet TAKE 1 AND 1/2 TABLETS BY  MOUTH TWICE DAILY 11/14/19  Yes Minus Breeding, MD  NEXIUM 40 MG capsule Take 1 capsule (40 mg total) by mouth daily before breakfast. 05/26/20  Yes Gatha Mayer, MD  ondansetron (ZOFRAN) 4 MG tablet Take 4 mg by mouth every 4 (four) hours as needed. For nausea 06/02/10  Yes Renato Shin, MD  oxyCODONE (ROXICODONE) 5 MG immediate release tablet Take 1 tablet (5 mg total) by mouth every 8 (eight) hours as needed for up to 10 days for severe pain. 08/03/20 08/13/20 Yes Roylene Heaton, Carola Rhine, MD  potassium chloride (KLOR-CON) 10 MEQ tablet TAKE 1 TABLET BY MOUTH  DAILY 06/23/20  Yes [name], MD  rosuvastatin (CRESTOR) 20 MG tablet TAKE 1 TABLET BY MOUTH  DAILY 04/05/20  Yes [name], MD  ALPRAZolam Duanne Moron) 0.25 MG tablet Take 1-3 tablets (0.25-0.75 mg total) by mouth once as needed for up to 1 dose for anxiety (MRI). 08/03/20   Pieter Partridge, DO  Blood Glucose Monitoring Suppl [facility] Karsten Fells) w/Device KIT Use to check blood sugars BID E11.69 07/27/20   [name], MD  furosemide (LASIX) 20 MG tablet Take 1 tablet (20 mg total) by mouth daily. 07/15/20   [name], MD  gabapentin (NEURONTIN) 100 MG capsule Take 1 capsule at bedtime for one week, then 2 capsules at bedtime for one week, then 3 capsules at bedtime  08/03/20   Tomi Likens, Adam R, DO  glucose blood (ONETOUCH ULTRA) test strip CHECK BLOOD SUGAR 3 TIMES  DAILY OR AS NEEDED. 10/28/19   [name], MD  Lancets [facility] DELICA PLUS MEQAST41D) MISC Use as directed 3 times a day.  DX code: E11.69 09/02/19   [name], MD  metoCLOPramide (REGLAN) 5 MG tablet TAKE 1 TABLET BY MOUTH  TWICE DAILY  02/04/20   Gatha Mayer, MD    Allergies    Tramadol  Review of Systems   Review of Systems  Constitutional:  Negative for chills and fever.  Respiratory:  Negative for cough and shortness of breath.   Cardiovascular:  Negative for chest pain and palpitations.  Gastrointestinal:  Negative for abdominal pain and vomiting.  Musculoskeletal:  Positive for arthralgias, myalgias and neck pain.  Skin:  Negative for color change and rash.  Neurological:  Negative for syncope and light-headedness.  All other systems reviewed and are negative.  Physical Exam Updated Vital Signs BP (!) 158/60   Pulse 80   Temp 98.5 F (36.9 C) (Oral)   Resp 20   LMP 02/07/1992   SpO2 97%   Physical Exam Constitutional:      General: She is not in acute distress. HENT:     Head: Normocephalic and atraumatic.  Eyes:     Conjunctiva/sclera: Conjunctivae normal.     Pupils: Pupils are equal, round, and reactive to light.  Neck:     Comments: Trigger point tenderness left trapezius and deltoid muscle Worse with movement, sitting upright Cardiovascular:     Rate and Rhythm: Normal rate and regular rhythm.     Pulses: Normal pulses.  Pulmonary:     Effort: Pulmonary effort is normal. No respiratory distress.  Abdominal:     General: There is no distension.  Skin:    General: Skin is warm and dry.  Neurological:     General: No focal deficit present.     Mental Status: She is alert and oriented to person, place, and time. Mental status is at baseline.     Cranial Nerves: No cranial nerve deficit.     Sensory: No sensory deficit.     Motor: No weakness.  Psychiatric:        Mood and Affect: Mood normal.        Behavior: Behavior normal.    ED Results / Procedures / Treatments   Labs (all labs ordered are listed, but only abnormal results are displayed) Labs Reviewed - No data to display  EKG None  Radiology No results found.  Procedures Procedures   Medications Ordered in  ED Medications  acetaminophen (TYLENOL) tablet 1,000 mg (1,000 mg Oral Given 08/03/20 6222)    ED Course  I have reviewed the triage vital signs and the nursing notes.  Pertinent labs & imaging results that were available during my care of the patient were reviewed by me and considered in my medical decision making (see chart for details).  Patient is here with chronic left neck and shoulder pain.  No new neurological symptoms.  I doubt this is a stroke.  This been going on for several months.  Her pain is extremely positional, and her significant trigger point tenderness on exam.  I suspect that she may have a radiculopathy or pinched nerve in her neck.  We will give her some Tylenol here.  Unfortunately her pain management options are limited, and there is no good option given that she appears to  have side effects to every single medication.  However she is insistent that she has a lot of pain and "needs something."  We talked about trying a short narcotic prescription with oxycodone, but I would recommend she takes this at home, when she is sitting down or near her bed.  This is because she has gotten dizzy with tramadol in the past.  I also recommended she take this with food to minimize nausea.  Most importantly, I encouraged her to follow-up in the office with a neurologist today.  I also encouraged her to call her primary care physician or specialist the next time she has breakthrough pain, as they may be able to assist her over the phone.  Clinically, with this presentation, I have a lower suspicion for CVA, ACS, aortic dissection, central cord syndrome, PTX, vertebral dissection, or other life threatening emergency.  Carotid ultrasound on 05/28/20 showing very minimal plaques MRI brain on 07/20/20 showing:  IMPRESSION: 1. 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 2. Small remote right cerebellar infarcts and 2014. There also has been some volume loss in the brain, expected for interval aging.  For which she is seeing the neurologist today.  I do not believe there is anything emergent to be done for this process at this time.      Final Clinical Impression(s) / ED Diagnoses Final diagnoses:  Chronic left shoulder pain    Rx / DC Orders ED Discharge Orders          Ordered    oxyCODONE (ROXICODONE) 5 MG immediate release tablet  Every 8 hours PRN        08/03/20 0736    acetaminophen (TYLENOL) 325 MG tablet  Every 6 hours PRN        08/03/20 0736             Wyvonnia Dusky, MD 08/03/20 1428

## 2020-08-03 NOTE — Discharge Instructions (Addendum)
You were seen in the ER for pain in your left shoulder.  This been going on for many months.  You told me you have a appointment with a neurologist today at 83.  I think is very important that you are seen in the office.  It is possible that your pain is coming from a pinched nerve in your neck.   We talked about pain medicine.  I did prescribe you some stronger pain pills, oxycodone to take at home.  He can take these once every 8 hours with food.  These can make you a little dizzy or lightheaded, so you should be sitting down after taking these.  If you do not like the side effects, stop taking this medication.  Please call your primary care doctor's office if you have further questions.  You should keep taking tylenol 650 mg every 6 hours for pain at home.

## 2020-08-03 NOTE — Patient Instructions (Signed)
Start gabapentin - increase dose as instructed Will get MRI of cervical spine without contrast.  Will send prescription for Xanax.  Will need driver to and from the test. Follow up in 6 months but further recommendations pending results.

## 2020-08-03 NOTE — Progress Notes (Signed)
Just stopped cefdinir for 10 days and prednisone dose pack

## 2020-08-03 NOTE — ED Notes (Signed)
Patient left message for sister to come pick her up.

## 2020-08-04 ENCOUNTER — Telehealth: Payer: Self-pay

## 2020-08-04 NOTE — Telephone Encounter (Signed)
Spoke with pt and she is aware of lab and labs will done 7/14 at her visit

## 2020-08-04 NOTE — Telephone Encounter (Signed)
Patient called requesting lab results. I have advised her of recent lab work on 6/23. Per last note you would like to recheck at next visit. She is requesting to come in prior to her visit to have labs so that results are back to discuss with pcp at visit. Appointment to see PCP is 7/14.   Please advise if ok for patient to come in prior to visit for lab work. No labs placed as future currently.

## 2020-08-06 ENCOUNTER — Other Ambulatory Visit: Payer: Self-pay | Admitting: Family Medicine

## 2020-08-10 ENCOUNTER — Other Ambulatory Visit: Payer: Self-pay | Admitting: Family Medicine

## 2020-08-10 ENCOUNTER — Telehealth: Payer: Self-pay

## 2020-08-10 ENCOUNTER — Telehealth: Payer: Self-pay | Admitting: Neurology

## 2020-08-10 NOTE — Telephone Encounter (Signed)
Spoke with patient and advised her that we did not have any openings and that she should head to urgent care. She asked what we have tomorrow.  Dr. Nani Ravens agreed to see her at 1145 08/10/20.

## 2020-08-10 NOTE — Telephone Encounter (Signed)
Per Dr. Tomi Likens no this will not cause

## 2020-08-10 NOTE — Telephone Encounter (Signed)
Patient called and said she developed a sore throat yesterday and today it has worsened.   She wants to know if it is a possible side effect of gabapentin as it is listed as one. Please advise?

## 2020-08-10 NOTE — Telephone Encounter (Signed)
Pt advised of note. As well as instructions of ho to take the Gabapentin.  Take 1 capsule at bedtime for one week, then 2 capsules at bedtime for one week, then 3 capsules at bedtime.    Pt also wanted if the medication causes Dizziness. Pt states she feels unsteady in the morning after starting the Gabapentin.

## 2020-08-10 NOTE — Telephone Encounter (Signed)
Pt called stating she had a sore throat (neg Covid test done at home today) and needed an appt today.  I advised we were booked and I could offer her an appt on Thurs or she could go to urgent care.  She became very agitated at me and raised her voice at me and I advised patient I was trying to help her and would appreciate her not yelling at me.  I even looked up UC clinics near her house in [place].  Pt kept asking if someone could just speak to her. I advised everyone was assisting in afternoon clinic and I could send a message back to PCP's team for her.

## 2020-08-11 ENCOUNTER — Other Ambulatory Visit: Payer: Self-pay

## 2020-08-11 ENCOUNTER — Other Ambulatory Visit (HOSPITAL_BASED_OUTPATIENT_CLINIC_OR_DEPARTMENT_OTHER): Payer: Self-pay

## 2020-08-11 ENCOUNTER — Ambulatory Visit (INDEPENDENT_AMBULATORY_CARE_PROVIDER_SITE_OTHER): Payer: Medicare Other | Admitting: Family Medicine

## 2020-08-11 ENCOUNTER — Encounter: Payer: Self-pay | Admitting: Family Medicine

## 2020-08-11 ENCOUNTER — Encounter: Payer: Self-pay | Admitting: Family

## 2020-08-11 VITALS — BP 120/70 | HR 62 | Temp 98.1°F | Ht 61.0 in | Wt 164.4 lb

## 2020-08-11 DIAGNOSIS — J029 Acute pharyngitis, unspecified: Secondary | ICD-10-CM | POA: Diagnosis not present

## 2020-08-11 LAB — POCT RAPID STREP A (OFFICE): Rapid Strep A Screen: NEGATIVE

## 2020-08-11 MED ORDER — DULOXETINE HCL 30 MG PO CPEP: ORAL_CAPSULE | ORAL | 3 refills | Status: DC

## 2020-08-11 MED ORDER — FLUCONAZOLE 100 MG PO TABS
ORAL_TABLET | ORAL | 0 refills | Status: DC
  Filled 2020-08-11: qty 8, 7d supply, fill #0

## 2020-08-11 NOTE — Progress Notes (Signed)
SUBJECTIVE:   [name] is a 80 y.o. female presents to the clinic for:  Chief Complaint  Patient presents with   Sore Throat   Dizziness    Complains of sore throat for 3 days.  Other associated symptoms: sore throat and tongue pain, white patches .  Denies: sinus pain, rhinorrhea, itchy watery eyes, ear fullness, ear drainage, wheezing, shortness of breath, myalgia, and fevers, coughing Sick Contacts: none known Therapy to date:  probiotics, salt water gargles  Social History   Tobacco Use  Smoking Status Never  Smokeless Tobacco Never  Tobacco Comments   Never used tobacco    Patient's medications, allergies, past medical, surgical, social and family histories were reviewed and updated as appropriate.  OBJECTIVE:  BP 120/70   Pulse 62   Temp 98.1 F (36.7 C) (Oral)   Ht 5\' 1"  (1.549 m)   Wt 164 lb 6 oz (74.6 kg)   LMP 02/07/1992   SpO2 99%   BMI 31.06 kg/m  General: Awake, alert, appearing stated age Eyes: conjunctivae and sclerae clear Ears: normal TMs bilaterally Nose: no visible exudate Oropharynx: lips, mucosa, and tongue normal; teeth and gums normal Neck: supple, no significant adenopathy Lungs: clear to auscultation, no wheezes, rales or rhonchi, symmetric air entry, normal effort Heart: RRR Skin:reveals no rash Psych: Age appropriate judgment and insight  ASSESSMENT/PLAN:  Sore throat - Plan: POCT rapid strep A  Rapid strep. 7 d of Diflucan. Continue to practice good hand hygiene and push fluids. Tthroat lozenges, salt water gargles and an air humidifier for symptomatic care.  F/u prn. Pt voiced understanding and agreement to the plan.  [place], DO 08/11/20 12:05 PM

## 2020-08-11 NOTE — Telephone Encounter (Signed)
Pt taking only one tab for right now. She not suppose to start two cap until today.

## 2020-08-11 NOTE — Telephone Encounter (Signed)
Tried calling pt, Unable to LVM. Per Dr.Jaffe, Instead of gabapentin, we can try Cymbalta - 30mg  daily for one week, then increase to 60mg  at bedtime.

## 2020-08-11 NOTE — Telephone Encounter (Signed)
Pt will think about it and if she wants she will pick it up.

## 2020-08-11 NOTE — Patient Instructions (Addendum)
OK to take Tylenol 1000 mg (2 extra strength tabs) or 975 mg (3 regular strength tabs) every 6 hours as needed.  Consider throat lozenges, salt water gargles and an air humidifier for symptomatic care.   Please take it the full 7 days.  Let us know if you need anything.

## 2020-08-12 NOTE — Telephone Encounter (Signed)
Pt would like to talk with nurse or doctor. She said she has a lot of questions. She did not go into detail, she just said she rather talk with nurse or doctor

## 2020-08-13 NOTE — Telephone Encounter (Signed)
Pt wants to know if she has to stop the Gabapentin slowly or just stop completley.  Per dr.Jaffe pt okay to stop Gabapentin and start Cymbalta.

## 2020-08-19 ENCOUNTER — Ambulatory Visit (INDEPENDENT_AMBULATORY_CARE_PROVIDER_SITE_OTHER): Payer: Medicare Other | Admitting: Family Medicine

## 2020-08-19 ENCOUNTER — Other Ambulatory Visit: Payer: Self-pay

## 2020-08-19 DIAGNOSIS — I10 Essential (primary) hypertension: Secondary | ICD-10-CM

## 2020-08-19 DIAGNOSIS — E669 Obesity, unspecified: Secondary | ICD-10-CM

## 2020-08-19 DIAGNOSIS — R600 Localized edema: Secondary | ICD-10-CM

## 2020-08-19 DIAGNOSIS — G500 Trigeminal neuralgia: Secondary | ICD-10-CM

## 2020-08-19 DIAGNOSIS — E1169 Type 2 diabetes mellitus with other specified complication: Secondary | ICD-10-CM | POA: Diagnosis not present

## 2020-08-19 DIAGNOSIS — K5900 Constipation, unspecified: Secondary | ICD-10-CM

## 2020-08-19 DIAGNOSIS — N289 Disorder of kidney and ureter, unspecified: Secondary | ICD-10-CM | POA: Diagnosis not present

## 2020-08-19 MED ORDER — FUROSEMIDE 20 MG PO TABS: 20.0000 mg | ORAL_TABLET | Freq: Every day | ORAL | 1 refills | Status: DC | PRN

## 2020-08-19 NOTE — Progress Notes (Signed)
[name], female    DOB: 1940-10-05  Age: 80 y.o. MRN: [id number]    Subjective:  Subjective  HPI [name] presents for office visit today for follow up on left trigeminal neuralgia and medication management for gabapentin. She reports that Gabapentin has helped improve her burning, numbness, and pain sensation. However, when she tapered from 2 to 3 tablets of gabapentin, she started experiencing downiness, but she is fine on 2. She experiences some discomfort in neck region, but overall she is better now since starting gabapentin. Denies CP/palp/SOB/HA/congestion/fevers/GI or GU c/o. Taking meds as prescribed. She reports that Gabapentin has helped with her sleep.  She expresses concern regarding her BP meds combo and whether she can taper off some of her meds now that her BP is getting better. She reports that she is getting an average systolic reading of 267-124 at the end of day.  Endorses taking probiotics which she states has improved her bowel movements. She states that she had to go to the ED before her visit with Dr. Tomi Likens due to the severe pain she was experiencing.   Review of Systems  Constitutional:  Negative for chills, fatigue and fever.       (+) drowsy secondary to increasing gabapentin 2 to 3 tablets  HENT:  Negative for congestion, rhinorrhea, sinus pressure, sinus pain and sore throat.   Eyes:  Negative for pain.  Respiratory:  Negative for cough and shortness of breath.   Cardiovascular:  Negative for chest pain, palpitations and leg swelling.  Gastrointestinal:  Negative for abdominal pain, blood in stool, diarrhea, nausea and vomiting.  Genitourinary:  Negative for decreased urine volume, flank pain, frequency, vaginal bleeding and vaginal discharge.  Musculoskeletal:  Negative for back pain.  Neurological:  Negative for headaches.   History Past Medical History:  Diagnosis Date   Abdominal pain in female 03/18/2010   Qualifier:  Diagnosis of  By: Carlean Purl MD, Tonna Boehringer E    Anemia 06/08/2014   Anxiety    Arthritis    Spinal Osteoarthritis   Cancer ([place])    Carcinoid tumor of stomach    Cataract    Chest pain    Myoview 12/15 no ischemia.   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 ([place]) 09/05/2006   Qualifier: Diagnosis of  By: Marca Ancona RMA, Lucy     Diabetic peripheral neuropathy ([place]) 10/29/2013   Encounter for preventative adult health care exam with abnormal findings 09/14/2013   Esophageal reflux    Gastric polyp    Fundic Gland   Gastroparesis    Headache(784.0)    Heart murmur    Echocardiogram 2/11: EF 60-65%, mild LAE, grade 1 diastolic dysfunction, aortic valve sclerosis, mean gradient 9 mm of mercury, PASP 34   Hematuria 03/16/2016   Iron deficiency anemia, unspecified    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   Leg swelling    bilateral   Neck pain 04/22/2015   PONV (postoperative nausea and vomiting)    pt states only needs small amount of anesthesia   PSVT (paroxysmal supraventricular tachycardia) ([place])    Pure hypercholesterolemia    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   Renal insufficiency 06/18/2019   Stroke ([place])    tia, 2014   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   Type II or unspecified type diabetes mellitus without mention of complication, not stated as uncontrolled    Unspecified essential hypertension  Unspecified hereditary and idiopathic peripheral neuropathy 10/29/2013    She has a past surgical history that includes Cholecystectomy (1993); Tonsillectomy; Colonoscopy (11/11/2010); Esophagogastroduodenoscopy (08/29/2010; 09/15/2010); EUS (12/15/2010); Esophagogastroduodenoscopy (05/16/2011); Esophagogastroduodenoscopy (06/14/2012); Dilatation & currettage/hysteroscopy with resectoscope (N/A, 02/25/2013); laparoscopy (N/A, 02/25/2013); and Eye surgery (Bilateral).   Her family history includes Diabetes in her brother, maternal grandmother, mother,  sister, and sister; Heart disease in her brother, brother, sister, sister, and sister; Hyperlipidemia in her brother, sister, and sister; Hypertension in her brother, paternal grandmother, sister, and sister; Stroke in her father.She reports that she has never smoked. She has never used smokeless tobacco. She reports that she does not drink alcohol and does not use drugs.  Current Outpatient Medications on File Prior to Visit  Medication Sig Dispense Refill   acetaminophen (TYLENOL) 325 MG tablet Take 2 tablets (650 mg total) by mouth every 6 (six) hours as needed for up to 30 doses for mild pain or moderate pain. 30 tablet 0   ALPRAZolam (XANAX) 0.25 MG tablet Take 1-3 tablets (0.25-0.75 mg total) by mouth once as needed for up to 1 dose for anxiety (MRI). 6 tablet 0   amLODipine (NORVASC) 5 MG tablet TAKE 1 TABLET BY MOUTH  DAILY 90 tablet 3   betamethasone valerate ointment (VALISONE) 0.1 % Apply qod to affected area, small amount 45 g 1   Blood Glucose Monitoring Suppl (ONETOUCH ULTRALINK) w/Device KIT Use to check blood sugars BID E11.69 1 kit 0   cholecalciferol (VITAMIN D) 1000 UNITS tablet Take 1,000 Units by mouth daily.     dicyclomine (BENTYL) 10 MG capsule TAKE ONE CAPSULE BY MOUTH 4 TIMES DAILY AS NEEDED 120 capsule 1   gabapentin (NEURONTIN) 100 MG capsule Take 1 capsule at bedtime for one week, then 2 capsules at bedtime for one week, then 3 capsules at bedtime 90 capsule 0   hydrALAZINE (APRESOLINE) 10 MG tablet Take 10 mg by mouth as needed.     Lancets (ONETOUCH ULTRASOFT) lancets USE AS DIRECTED 3 TIMES  DAILY 300 each 3   losartan (COZAAR) 50 MG tablet TAKE 1 TABLET BY MOUTH  TWICE DAILY 180 tablet 3   metFORMIN (GLUCOPHAGE-XR) 500 MG 24 hr tablet TAKE 1 TABLET BY MOUTH IN  THE MORNING AND AT BEDTIME 180 tablet 3   metoCLOPramide (REGLAN) 5 MG tablet TAKE 1 TABLET BY MOUTH  TWICE DAILY 180 tablet 0   metoprolol tartrate (LOPRESSOR) 50 MG tablet TAKE 1 AND 1/2 TABLETS BY  MOUTH  TWICE DAILY 270 tablet 3   NEXIUM 40 MG capsule Take 1 capsule (40 mg total) by mouth daily before breakfast. 90 capsule 3   ondansetron (ZOFRAN) 4 MG tablet Take 4 mg by mouth every 4 (four) hours as needed. For nausea     ONETOUCH ULTRA test strip CHECK BLOOD SUGAR 3 TIMES  DAILY OR AS NEEDED. 300 strip 3   potassium chloride (KLOR-CON) 10 MEQ tablet TAKE 1 TABLET BY MOUTH  DAILY 90 tablet 3   rosuvastatin (CRESTOR) 20 MG tablet TAKE 1 TABLET BY MOUTH  DAILY 90 tablet 3   No current facility-administered medications on file prior to visit.     Objective:  Objective  Physical Exam Constitutional:      General: She is not in acute distress.    Appearance: Normal appearance. She is not ill-appearing or toxic-appearing.  HENT:     Head: Normocephalic and atraumatic.     Right Ear: Tympanic membrane, ear canal and external ear normal.  Left Ear: Tympanic membrane, ear canal and external ear normal.     Nose: No congestion or rhinorrhea.  Eyes:     Extraocular Movements: Extraocular movements intact.     Pupils: Pupils are equal, round, and reactive to light.  Cardiovascular:     Rate and Rhythm: Normal rate and regular rhythm.     Pulses: Normal pulses.     Heart sounds: Normal heart sounds. No murmur heard. Pulmonary:     Effort: Pulmonary effort is normal. No respiratory distress.     Breath sounds: Normal breath sounds. No wheezing, rhonchi or rales.  Abdominal:     General: Bowel sounds are normal.     Palpations: Abdomen is soft. There is no mass.     Tenderness: no abdominal tenderness There is no guarding.     Hernia: No hernia is present.  Musculoskeletal:        General: Normal range of motion.     Cervical back: Normal range of motion and neck supple.  Skin:    General: Skin is warm and dry.  Neurological:     Mental Status: She is alert and oriented to person, place, and time.  Psychiatric:        Behavior: Behavior normal.   BP 122/66   Pulse 69   Temp 98.1  F (36.7 C)   Resp 16   Wt 166 lb 6.4 oz (75.5 kg)   LMP 02/07/1992   SpO2 99%   BMI 31.44 kg/m  Wt Readings from Last 3 Encounters:  08/19/20 166 lb 6.4 oz (75.5 kg)  08/11/20 164 lb 6 oz (74.6 kg)  08/03/20 164 lb 3.2 oz (74.5 kg)     Lab Results  Component Value Date   WBC 12.1 (H) 07/13/2020   HGB 13.1 07/13/2020   HCT 37.8 07/13/2020   PLT 197.0 07/13/2020   GLUCOSE 139 (H) 07/29/2020   CHOL 213 (H) 06/28/2020   TRIG 129.0 06/28/2020   HDL 78.20 06/28/2020   LDLDIRECT 95.0 03/25/2020   LDLCALC 109 (H) 06/28/2020   ALT 29 07/29/2020   AST 23 07/29/2020   NA 132 (L) 07/29/2020   K 4.2 07/29/2020   CL 94 (L) 07/29/2020   CREATININE 0.75 07/29/2020   BUN 19 07/29/2020   CO2 28 07/29/2020   TSH 1.71 06/28/2020   INR 0.88 01/16/2011   HGBA1C 6.6 (H) 06/28/2020   MICROALBUR 1.6 11/13/2014    No results found.   Assessment & Plan:  Plan    Meds ordered this encounter  Medications   furosemide (LASIX) 20 MG tablet    Sig: Take 1 tablet (20 mg total) by mouth daily as needed.    Dispense:  90 tablet    Refill:  1    Problem List Items Addressed This Visit     Diabetes mellitus type 2 in obese (HCC) (Chronic)    hgba1c acceptable, minimize simple carbs. Increase exercise as tolerated. Continue current meds       Essential hypertension (Chronic)    Well controlled, no changes to meds. Encouraged heart healthy diet such as the DASH diet and exercise as tolerated.        Relevant Medications   furosemide (LASIX) 20 MG tablet   Constipation    Probiotics and hydration have been helpful.        Renal insufficiency    Hydrate and monitor       Trigeminal neuralgia    She started feeling significantly better after a  course of steroids and is now on Gabapentin given to her by neurology and she notes it makes her sleepy but does seem to be helping. She is taking 300 mg at bedtime and can consider 100 mg bid if needed. She will follow up with  neurology       Pedal edema    Minimize sodium, consider compression hose, elevate feet above heart and use Lasix prn. Given prescription today        Follow-up: Return in about 7 weeks (around 10/04/2020).  I, Suezanne Jacquet, acting as a scribe for Penni Homans, MD, have documented all relevent documentation on behalf of Penni Homans, MD, as directed by Penni Homans, MD while in the presence of Penni Homans, MD.  I, [name], MD personally performed the services described in this documentation. All medical record entries made by the scribe were at my direction and in my presence. I have reviewed the chart and agree that the record reflects my personal performance and is accurate and complete

## 2020-08-19 NOTE — Patient Instructions (Signed)
Trigeminal Neuralgia  Trigeminal neuralgia is a nerve disorder that causes severe pain on one side of the face. The pain may last from a few seconds to several minutes. The pain isusually only on one side of the face. Symptoms may occur for days, weeks, or months and then go away for months oryears. The pain may return and be worse than before. What are the causes? This condition is caused by damage or pressure to a nerve in the head that is called the trigeminal nerve. An attack can be triggered by: Talking. Chewing. Putting on makeup. Washing your face. Shaving your face. Brushing your teeth. Touching your face. What increases the risk? You are more likely to develop this condition if you: Are 79 years of age or older. Are female. What are the signs or symptoms? The main symptom of this condition is severe pain in the: Jaw. Lips. Eyes. Nose. Scalp. Forehead. Face. The pain may be: Intense. Stabbing. Electric. Shock-like. How is this diagnosed? This condition is diagnosed with a physical exam. A CT scan or an MRI may bedone to rule out other conditions that can cause facial pain. How is this treated? This condition may be treated with: Avoiding the things that trigger your symptoms. Taking prescription medicines (anticonvulsants). Having surgery. This may be done in severe cases if other medical treatment does not provide relief. Having procedures such as ablation, thermal, or radiation therapy. It may take up to one month for treatment to start relieving the pain. Follow these instructions at home: Managing pain Learn as much as you can about how to manage your pain. Ask your health care provider if a pain specialist would be helpful. Consider talking with a mental health care provider (psychologist) about how to cope with the pain. Consider joining a pain support group. General instructions Take over-the-counter and prescription medicines only as told by your health  care provider. Avoid the things that trigger your symptoms. It may help to: Chew on the unaffected side of your mouth. Avoid touching your face. Avoid blasts of hot or cold air. Follow your treatment plan as told by your health care provider. This may include: Cognitive or behavioral therapy. Gentle, regular exercise. Meditation or yoga. Aromatherapy. Keep all follow-up visits as told by your health care provider. You may need to be monitored closely to make sure treatment is working well for you. Where to find more information [employer] Association: fpa-support.org Contact a health care provider if: Your medicine is not helping your symptoms. You have side effects from the medicine used for treatment. You develop new, unexplained symptoms, such as: Double vision. Facial weakness. Facial numbness. Changes in hearing or balance. You feel depressed. Get help right away if: Your pain is severe and is not getting better. You develop suicidal thoughts. If you ever feel like you may hurt yourself or others, or have thoughts about taking your own life, get help right away. You can go to your nearest emergency department or call: Your local emergency services (911 in the [place] A suicide crisis helpline, such as the [place] at 315-646-9297. This is open 24 hours a day. Summary Trigeminal neuralgia is a nerve disorder that causes severe pain on one side of the face. The pain may last from a few seconds to several minutes. This condition is caused by damage or pressure to a nerve in the head that is called the trigeminal nerve. Treatment may include avoiding the things that trigger your symptoms, taking medicines,  or having surgery or procedures. It may take up to one month for treatment to start relieving the pain. Avoid the things that trigger your symptoms. Keep all follow-up visits as told by your health care provider. You may need to be monitored closely  to make sure treatment is working well for you. This information is not intended to replace advice given to you by your health care provider. Make sure you discuss any questions you have with your healthcare provider. Document Revised: 12/10/2017 Document Reviewed: 12/10/2017 Elsevier Patient Education  2022 [employer].

## 2020-08-21 DIAGNOSIS — G500 Trigeminal neuralgia: Secondary | ICD-10-CM | POA: Insufficient documentation

## 2020-08-21 DIAGNOSIS — R600 Localized edema: Secondary | ICD-10-CM | POA: Insufficient documentation

## 2020-08-21 NOTE — Assessment & Plan Note (Signed)
Minimize sodium, consider compression hose, elevate feet above heart and use Lasix prn. Given prescription today

## 2020-08-21 NOTE — Assessment & Plan Note (Signed)
Probiotics and hydration have been helpful.

## 2020-08-21 NOTE — Assessment & Plan Note (Signed)
Hydrate and monitor 

## 2020-08-21 NOTE — Assessment & Plan Note (Signed)
Well controlled, no changes to meds. Encouraged heart healthy diet such as the DASH diet and exercise as tolerated.  °

## 2020-08-21 NOTE — Assessment & Plan Note (Signed)
hgba1c acceptable, minimize simple carbs. Increase exercise as tolerated. Continue current meds 

## 2020-08-21 NOTE — Assessment & Plan Note (Signed)
She started feeling significantly better after a course of steroids and is now on Gabapentin given to her by neurology and she notes it makes her sleepy but does seem to be helping. She is taking 300 mg at bedtime and can consider 100 mg bid if needed. She will follow up with neurology

## 2020-08-24 ENCOUNTER — Other Ambulatory Visit: Payer: Self-pay | Admitting: Internal Medicine

## 2020-08-24 ENCOUNTER — Telehealth: Payer: Self-pay | Admitting: Neurology

## 2020-08-24 NOTE — Telephone Encounter (Signed)
I have called patient because needed patient to explain the message that was taken earlier today.  So patient have taken gabapentin 100 mg at bedtime.  For 1 week she took 1 capsule then 2 capsules for 1 week then 3 capsules for 1 week.  Patient asked is she supposed to stop taking or continue taking the gabapentin 100 mg.   I did asked her if gabapentin helping and she stated it help some.

## 2020-08-24 NOTE — Telephone Encounter (Signed)
Pt called in wanting to find out what to do with the rest of her gabapentin that is left after the last 3 weeks?

## 2020-08-24 NOTE — Telephone Encounter (Signed)
I have called patient and she is taking gabapentin. She stated that she was already 10 days in with the gabapentin so she decided to continue as instructed on the bottle.  She stated that she did not wanted to start a new medication because it would cause dizziness too.  I have asked how she is doing on her dizziness. She stated that she is a little dizzy in the morning but it gets better as later in the day.

## 2020-08-25 NOTE — Telephone Encounter (Signed)
Spoken to patient yesterday afternoon.  So the thing is patient stated wanted a time line for the course of taking gabapentin. She stated yes, it help with the pain but she does not want to take this gabapentin for the rest of her life, she don't like taking medication. She asked me how long does she have take this and I told her it is up her and the doctor. So she insisted on speaking to Dr Tomi Likens and ask for you to call.  She mention it help with the pain but think maybe taking 200 mg instead but I did mention 300 mg comes it in 1 capsule.

## 2020-08-25 NOTE — Telephone Encounter (Signed)
Spoken to patient and notified Dr Georgie Chard comments. So patient wanted to know if she wants to stop, would she need to wean down or can she completely stop.  Patient wants to see if she stops, would she be able to manage without medication regarding her pain

## 2020-08-26 NOTE — Telephone Encounter (Signed)
Spoken to patient and notified Jaffe's comments. Verbalized understanding.

## 2020-08-30 ENCOUNTER — Ambulatory Visit
Admission: RE | Admit: 2020-08-30 | Discharge: 2020-08-30 | Disposition: A | Discharge status: Home / Self Care | Payer: Medicare Other | Source: Ambulatory Visit | Attending: Neurology | Admitting: Neurology

## 2020-08-30 ENCOUNTER — Other Ambulatory Visit: Payer: Self-pay

## 2020-08-30 DIAGNOSIS — R29898 Other symptoms and signs involving the musculoskeletal system: Secondary | ICD-10-CM

## 2020-08-30 DIAGNOSIS — M5412 Radiculopathy, cervical region: Secondary | ICD-10-CM

## 2020-08-30 DIAGNOSIS — M4802 Spinal stenosis, cervical region: Secondary | ICD-10-CM | POA: Diagnosis not present

## 2020-08-30 DIAGNOSIS — R208 Other disturbances of skin sensation: Secondary | ICD-10-CM

## 2020-08-30 DIAGNOSIS — R200 Anesthesia of skin: Secondary | ICD-10-CM

## 2020-08-31 ENCOUNTER — Telehealth: Payer: Self-pay

## 2020-08-31 DIAGNOSIS — R29898 Other symptoms and signs involving the musculoskeletal system: Secondary | ICD-10-CM

## 2020-08-31 DIAGNOSIS — G589 Mononeuropathy, unspecified: Secondary | ICD-10-CM

## 2020-08-31 DIAGNOSIS — R208 Other disturbances of skin sensation: Secondary | ICD-10-CM

## 2020-08-31 DIAGNOSIS — R200 Anesthesia of skin: Secondary | ICD-10-CM

## 2020-08-31 NOTE — Telephone Encounter (Signed)
Telephone call to pt, Advised pt of Mri Cervical spine results.  Pt agreed to Physical Therapy, She do not want surgery.  Pt wanted to know what could be causing the pain and tingling in her face?

## 2020-08-31 NOTE — Telephone Encounter (Signed)
-----   Message from Pieter Partridge, DO sent at 08/31/2020  8:29 AM EDT ----- MRI of cervical spine does show pinched nerves which definitely can explain the left arm pain and numbness.  I would recommend physical therapy for neck pain and cervical radiculopathy (if she is having back pain, we can refer for that as well). If she is not agreeable to this option, then we can refer to neurosurgery for evaluation.

## 2020-08-31 NOTE — Telephone Encounter (Signed)
PER Dr.Jaffe,  It may be an irritation of a nerve that supplies sensation to the face.   Lmovm for pt.

## 2020-09-06 MED ORDER — GABAPENTIN 100 MG PO CAPS: ORAL_CAPSULE | ORAL | 5 refills | Status: DC

## 2020-09-06 NOTE — Telephone Encounter (Signed)
Script sent to [facility] on [address].

## 2020-09-06 NOTE — Telephone Encounter (Signed)
Pt advised of her referral to Physical therapy for the neck pain, and back pain.  Pt wanted to know if she is okay to go up to 3 capsules of Gabapentin.  Pt also wanted to know if she could take one capsule in the morning, and two at night. To help with the dizziness.  Pt wants to make  sure it is okay for her to take with her stomach medication.  Is fluid build up  side effect of Gabapentin.

## 2020-09-06 NOTE — Telephone Encounter (Signed)
She may increase gabapentin to 1 pill in morning and 2 pills at night.  Yes, it can cause fluid buildup.    Pt advise of note above.

## 2020-09-06 NOTE — Telephone Encounter (Signed)
Patient called and requested a refill on her gabapentin. She said she is continuing to have the same symptoms and wants to see Dr. Tomi Likens again for an urgent appointment.   Patient last seen 08/03/20  She said she tried to stop it but thinks she needs to go back to 3 capsules at a time.  [facility] in [place]

## 2020-09-17 ENCOUNTER — Ambulatory Visit: Payer: Medicare Other | Admitting: Family

## 2020-09-17 ENCOUNTER — Other Ambulatory Visit: Payer: Medicare Other

## 2020-09-21 ENCOUNTER — Inpatient Hospital Stay: Payer: Medicare Other

## 2020-09-21 ENCOUNTER — Inpatient Hospital Stay: Payer: Medicare Other | Admitting: Family

## 2020-09-24 ENCOUNTER — Telehealth: Payer: Self-pay | Admitting: Family Medicine

## 2020-09-24 ENCOUNTER — Other Ambulatory Visit: Payer: Self-pay | Admitting: Family Medicine

## 2020-09-24 DIAGNOSIS — G459 Transient cerebral ischemic attack, unspecified: Secondary | ICD-10-CM

## 2020-09-24 DIAGNOSIS — G453 Amaurosis fugax: Secondary | ICD-10-CM

## 2020-09-24 DIAGNOSIS — R9089 Other abnormal findings on diagnostic imaging of central nervous system: Secondary | ICD-10-CM

## 2020-09-24 DIAGNOSIS — R519 Headache, unspecified: Secondary | ICD-10-CM

## 2020-09-24 NOTE — Telephone Encounter (Signed)
Pt is requesting that a new referral be send to [facility] neuro. She also wants a referral to speak to Glena Norfolk since she is now currently taking  10 meds and she states she needs assistance.

## 2020-09-28 ENCOUNTER — Other Ambulatory Visit: Payer: Self-pay

## 2020-09-28 DIAGNOSIS — E1169 Type 2 diabetes mellitus with other specified complication: Secondary | ICD-10-CM

## 2020-09-28 DIAGNOSIS — I10 Essential (primary) hypertension: Secondary | ICD-10-CM

## 2020-09-28 NOTE — Telephone Encounter (Signed)
Pt aware and done

## 2020-09-30 ENCOUNTER — Telehealth: Payer: Self-pay | Admitting: Diagnostic Neuroimaging

## 2020-09-30 NOTE — Telephone Encounter (Signed)
Patient is being referred her by PCP for abnormal brain MRI, TIA, Amaurosis fugax of left eye, headache. She was last seen at our office by Dr. Leta Baptist in 2016 and then transferred care to [facility] and has been seeing Dr. Tomi Likens. She is now requesting to come back here but wants to see Dr. Leonie Man. Please advise if this is acceptable, thank you.

## 2020-10-05 DIAGNOSIS — K1321 Leukoplakia of oral mucosa, including tongue: Secondary | ICD-10-CM | POA: Diagnosis not present

## 2020-10-06 ENCOUNTER — Encounter: Payer: Self-pay | Admitting: Family

## 2020-10-07 ENCOUNTER — Ambulatory Visit: Payer: Medicare Other | Admitting: Physical Therapy

## 2020-10-14 ENCOUNTER — Other Ambulatory Visit: Payer: Self-pay | Admitting: Cardiology

## 2020-10-14 ENCOUNTER — Ambulatory Visit: Payer: Medicare Other | Admitting: Physical Therapy

## 2020-10-18 ENCOUNTER — Encounter: Payer: Medicare Other | Admitting: Physical Therapy

## 2020-10-19 ENCOUNTER — Ambulatory Visit: Payer: Medicare Other | Admitting: Family Medicine

## 2020-10-19 ENCOUNTER — Other Ambulatory Visit: Payer: Self-pay

## 2020-10-21 ENCOUNTER — Encounter: Payer: Medicare Other | Admitting: Physical Therapy

## 2020-10-25 ENCOUNTER — Encounter: Payer: Medicare Other | Admitting: Physical Therapy

## 2020-11-01 ENCOUNTER — Other Ambulatory Visit: Payer: Self-pay

## 2020-11-01 ENCOUNTER — Encounter: Payer: Self-pay | Admitting: Family Medicine

## 2020-11-01 ENCOUNTER — Other Ambulatory Visit: Payer: Self-pay | Admitting: Family Medicine

## 2020-11-01 ENCOUNTER — Ambulatory Visit (INDEPENDENT_AMBULATORY_CARE_PROVIDER_SITE_OTHER): Payer: Medicare Other | Admitting: Family Medicine

## 2020-11-01 VITALS — BP 110/66 | HR 64 | Temp 97.8°F | Resp 16 | Wt 166.2 lb

## 2020-11-01 DIAGNOSIS — I10 Essential (primary) hypertension: Secondary | ICD-10-CM | POA: Diagnosis not present

## 2020-11-01 DIAGNOSIS — D500 Iron deficiency anemia secondary to blood loss (chronic): Secondary | ICD-10-CM

## 2020-11-01 DIAGNOSIS — E519 Thiamine deficiency, unspecified: Secondary | ICD-10-CM

## 2020-11-01 DIAGNOSIS — R9089 Other abnormal findings on diagnostic imaging of central nervous system: Secondary | ICD-10-CM

## 2020-11-01 DIAGNOSIS — E559 Vitamin D deficiency, unspecified: Secondary | ICD-10-CM | POA: Diagnosis not present

## 2020-11-01 DIAGNOSIS — G500 Trigeminal neuralgia: Secondary | ICD-10-CM

## 2020-11-01 DIAGNOSIS — D3A092 Benign carcinoid tumor of the stomach: Secondary | ICD-10-CM

## 2020-11-01 DIAGNOSIS — E1142 Type 2 diabetes mellitus with diabetic polyneuropathy: Secondary | ICD-10-CM | POA: Diagnosis not present

## 2020-11-01 DIAGNOSIS — R519 Headache, unspecified: Secondary | ICD-10-CM

## 2020-11-01 DIAGNOSIS — E669 Obesity, unspecified: Secondary | ICD-10-CM

## 2020-11-01 DIAGNOSIS — N289 Disorder of kidney and ureter, unspecified: Secondary | ICD-10-CM

## 2020-11-01 DIAGNOSIS — G459 Transient cerebral ischemic attack, unspecified: Secondary | ICD-10-CM

## 2020-11-01 DIAGNOSIS — E1169 Type 2 diabetes mellitus with other specified complication: Secondary | ICD-10-CM

## 2020-11-01 DIAGNOSIS — E7800 Pure hypercholesterolemia, unspecified: Secondary | ICD-10-CM

## 2020-11-01 DIAGNOSIS — R600 Localized edema: Secondary | ICD-10-CM | POA: Diagnosis not present

## 2020-11-01 DIAGNOSIS — D538 Other specified nutritional anemias: Secondary | ICD-10-CM

## 2020-11-01 DIAGNOSIS — K3184 Gastroparesis: Secondary | ICD-10-CM

## 2020-11-01 DIAGNOSIS — Z79899 Other long term (current) drug therapy: Secondary | ICD-10-CM

## 2020-11-01 DIAGNOSIS — M5441 Lumbago with sciatica, right side: Secondary | ICD-10-CM

## 2020-11-01 NOTE — Patient Instructions (Addendum)
You are now taking Gabapentin 3 caps a day. Drop to 2 a day x 2 weeks then 1 a day x 2 weeks and then stop unless you have worsening pain with decrease. If so call us  Drop the Metformin to 1 tab daily   Paxlovid or Molnupiravir is the new COVID medication we can give you if you get COVID so make sure you test if you have symptoms because we have to treat by day 5 of symptoms for it to be effective. If you are positive let us know so we can treat. If a home test is negative and your symptoms are persistent get a PCR test. Can check testing locations at [email or web address] If you are positive we will make an appointment with [place] and we will send in Paxlovid or Molnupiravir if you would like it. Check with your pharmacy before we meet to confirm they have it in stock, if they do not then we can get the prescription at the [place] Nonallergic Rhinitis Nonallergic rhinitis is inflammation of the mucous membrane inside the nose. The mucous membrane is the tissue that produces mucus. This condition is different from having allergic rhinitis, which is an allergy that affects the nose. Allergic rhinitis occurs when the body's defense system, or immune system, reacts to a substance that a person is allergic to (allergen), such as pollen, pet dander, mold, or dust. Nonallergic rhinitis has many similar symptoms, but it is not caused by allergens. Nonallergic rhinitis can be an acute or chronic problem. This means it can be short-term or long-term. What are the causes? This condition may be caused by many different things. Some common types of nonallergic rhinitis include: Infectious rhinitis. This is usually caused by an infection in the nose, throat, or upper airways (upper respiratory system). Vasomotor rhinitis. This is the most common type of chronic nonallergic rhinitis. It is caused by too much blood flow through your nose, and it leads to swelling in your nose. It is triggered by strong  odors, cold air, stress, drinking alcohol, cigarette smoke, or changes in the weather. Occupational rhinitis. This type is caused by triggers in the workplace, such as chemicals, dust, animal dander, or air pollution. Hormonal rhinitis, in teenage girls and women. This type is caused by an increase in the hormone estrogen and may happen during pregnancy, puberty, or monthly menstrual periods. Hormonal rhinitis gives you fewer symptoms when estrogen levels drop. Drug-induced rhinitis. Several types of medicines can cause this, such as medicines for high blood pressure or heart disease, aspirin, or NSAIDs. Nonallergic rhinitis with eosinophilia syndrome (NARES). This type is caused by having too much eosinophil, a type of white blood cell. Other causes include a reaction to eating hot or spicy foods. This does not usually cause long-term symptoms. In some cases, the cause of nonallergic rhinitis is not known. What increases the risk? You are more likely to develop this condition if: You are 17-71 years of age. You are a woman. Women are twice as likely to have this condition. What are the signs or symptoms? Common symptoms of this condition include: Stuffy nose (nasal congestion). Runny nose. A feeling of mucus dripping down the back of your throat (postnasal drip). Trouble sleeping. Tiredness, or fatigue. Other symptoms include: Sneezing. Coughing. Itchy nose. Bloodshot eyes. How is this diagnosed? This type may be diagnosed based on: Your symptoms and medical history. A physical exam. Allergy testing to rule out allergic rhinitis. You may have skin tests or  blood tests. Your health care provider may also take a swab of nasal discharge to look for an increased number of eosinophils. This would be done to confirm a diagnosis of NARES. How is this treated? Treatment for this condition depends on the cause. No single treatment works for everyone. Work with your health care provider to find  the best treatment for you. Treatment may include: Avoiding the things that trigger your symptoms. Medicines to relieve congestion, such as: Steroid nasal spray. There are many types. You may need to try a few to find out which one works best. Best boy medicine. This treats nasal congestion and may be given by mouth or as a nasal spray. These medicines are used only for a short time. Medicines to relieve a runny nose. These may include antihistamine medicines or anticholinergic nasal sprays. Nasal irrigation. This involves using a salt-water (saline) spray or saline container called a neti pot. Nasal irrigation helps to clear away mucus and keep your nasal passages moist. Surgery to remove part of your mucous membrane. This is done in severe cases if the condition has not improved after 6-12 months of treatment. Follow these instructions at home: Medicines Take or use over-the-counter and prescription medicines only as told by your health care provider. Do not stop using your medicine even if you start to feel better. Do not take NSAIDs, such as ibuprofen, or medicines that contain aspirin if they make your symptoms worse. Lifestyle Do not drink alcohol if it makes your symptoms worse. Do not use any products that contain nicotine or tobacco, such as cigarettes, e-cigarettes, and chewing tobacco. If you need help quitting, ask your health care provider. Avoid secondhand smoke. General instructions Avoid triggers that make your symptoms worse. Use nasal irrigation as told by your health care provider. Get exercise. Exercise may help reduce symptoms for some people. Sleep with the head of your bed raised. This may reduce nasal congestion when you sleep. Drink enough fluid to keep your urine pale yellow. Keep all follow-up visits as told by your health care provider. This is important. Contact a health care provider if: You have a fever. Your symptoms are getting worse at home. Your  symptoms do not lessen with medicine. You develop new symptoms, especially a headache or nosebleed. Summary Nonallergic rhinitis is inflammation inside the nose that is not caused by allergens. Nonallergic rhinitis can be a short-term or long-term problem. Treatment may include avoiding the things that trigger your symptoms. Take or use over-the-counter and prescription medicines only as told by your health care provider. Do not stop using your medicine even if you start to feel better. Contact a health care provider if your symptoms do not lessen with medicine. This information is not intended to replace advice given to you by your health care provider. Make sure you discuss any questions you have with your health care provider. Document Revised: 12/02/2018 Document Reviewed: 12/02/2018 Elsevier Patient Education  [place].

## 2020-11-01 NOTE — Progress Notes (Unsigned)
F [id number]

## 2020-11-01 NOTE — Assessment & Plan Note (Signed)
Encourage heart healthy diet such as MIND or DASH diet, increase exercise, avoid trans fats, simple carbohydrates and processed foods, consider a krill or fish or flaxseed oil cap daily.  Tolerating Rosuvastatin 

## 2020-11-01 NOTE — Assessment & Plan Note (Signed)
Still having pain and burning in her feet worse when they are more swollen encouraged to minimize sodium, elevate feet, stay as active as able.

## 2020-11-01 NOTE — Assessment & Plan Note (Signed)
Supplement and monitor 

## 2020-11-01 NOTE — Assessment & Plan Note (Signed)
She does not feel Gabapentin has helped and she has had enough side effects that she is preparing to stop it so she will drop from 300 to 200 mg daily x 1 month then 1 tab daily x 1 month then stop

## 2020-11-01 NOTE — Assessment & Plan Note (Signed)
Well controlled, no changes to meds. Encouraged heart healthy diet such as the DASH diet and exercise as tolerated.  °

## 2020-11-01 NOTE — Assessment & Plan Note (Signed)
Hydrate and monitor 

## 2020-11-01 NOTE — Progress Notes (Signed)
Subjective:   By signing my name below, I, Zite Okoli, attest that this documentation has been prepared under the direction and in the presence of [name], MD. 11/01/2020   [name], female    DOB: 04-01-1940, 80 y.o.   MRN: [id number]  Chief Complaint  Patient presents with  . Follow-up    HPI Patient is in today for an office visit.  She has been managing her neuropathy with 100 mg gabapentin that she was given after being admitted to the ED. She is experiencing dizziness and when she does not sleep, she gets shooting pains in her head and ear. However, she cannot tell if it is helping and would like to change it because of the side effects. She also mentions she cannot sleep on her left side because it increases the dizziness and the pain on the left side increases.  However, she reports that the burning sensation in her legs and hands have improved since she started the medication. She also mentions there is still fluid in her head and would like medication to reduce it.  She also reports having intermittent chest pain and is going to see a cardiologist next month. She does not notice the pattern of the pains and it is not exacerbated by anything.   She is also requesting for food options she can eat before bed when her sugar levels are low. She reports they are normally at 90 after dinner and before she goes to bed. She also reports that she has constant rhinorrhea especially after she eats.  She is also requesting for her thallium levels to be checked because she still has frequent episodes of GERD. She has been managing the condition with thallium, Nexium and Reglan and reports they provide great relief.  She is willing to get the flu and Covid-19 booster vaccine today.   Past Medical History:  Diagnosis Date  . Abdominal pain in female 03/18/2010   Qualifier: Diagnosis of  By: Carlean Purl MD, Dimas Millin Anemia 06/08/2014  . Anxiety   . Arthritis     Spinal Osteoarthritis  . Cancer ([place])   . Carcinoid tumor of stomach   . Cataract   . Chest pain    Myoview 12/15 no ischemia.  . Chronic kidney disease    Left kidney smaller than right kidney  . Constipation 11/21/2016  . Diabetes mellitus type 2 in obese ([place]) 09/05/2006   Qualifier: Diagnosis of  By: Marca Ancona RMA, Lucy    . Diabetic peripheral neuropathy ([place]) 10/29/2013  . Encounter for preventative adult health care exam with abnormal findings 09/14/2013  . Esophageal reflux   . Gastric polyp    Fundic Gland  . Gastroparesis   . Headache(784.0)   . Heart murmur    Echocardiogram 2/11: EF 60-65%, mild LAE, grade 1 diastolic dysfunction, aortic valve sclerosis, mean gradient 9 mm of mercury, PASP 34  . Hematuria 03/16/2016  . Iron deficiency anemia, unspecified   . 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 Leg swelling    bilateral  . Neck pain 04/22/2015  . PONV (postoperative nausea and vomiting)    pt states only needs small amount of anesthesia  . PSVT (paroxysmal supraventricular tachycardia) ([place])   . Pure hypercholesterolemia   . 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 Renal insufficiency 06/18/2019  . Stroke ([place])    tia, 2014  . 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 Type II or unspecified type diabetes mellitus without mention of  complication, not stated as uncontrolled   . Unspecified essential hypertension   . Unspecified hereditary and idiopathic peripheral neuropathy 10/29/2013    Past Surgical History:  Procedure Laterality Date  . CHOLECYSTECTOMY  1993  . COLONOSCOPY  11/11/2010   diverticulosis  . DILATATION & CURRETTAGE/HYSTEROSCOPY WITH RESECTOCOPE N/A 02/25/2013   Procedure: Attempted hysteroscopy with uterine perforation;  Surgeon: Jamey Reas de Berton Lan, MD;  Location: [facility];  Service: Gynecology;  Laterality: N/A;  . ESOPHAGOGASTRODUODENOSCOPY  08/29/2010; 09/15/2010   Carcinoid tumor less than 1 cm in July 2012 not seen in August 2012 , gastritis, fundic gland polyps  .  ESOPHAGOGASTRODUODENOSCOPY  05/16/2011  . ESOPHAGOGASTRODUODENOSCOPY  06/14/2012  . EUS  12/15/2010   Procedure: UPPER ENDOSCOPIC ULTRASOUND (EUS) LINEAR;  Surgeon: Owens Loffler, MD;  Location: [facility];  Service: Endoscopy;  Laterality: N/A;  . EYE SURGERY Bilateral    Bi lateral cateracts and bi lateral laser  . LAPAROSCOPY N/A 02/25/2013   Procedure: Cystoscopy and laparoscopy with fulguration of uterine serosa;  Surgeon: Jamey Reas de Berton Lan, MD;  Location: [facility];  Service: Gynecology;  Laterality: N/A;  . TONSILLECTOMY      Family History  Problem Relation Age of Onset  . Diabetes Mother   . Stroke Father        deceased age 48  . Heart disease Sister        deceased MI age 62  . Heart disease Brother        deceased MI age 43  . Diabetes Maternal Grandmother   . Hypertension Paternal Grandmother   . Diabetes Sister   . Heart disease Sister   . Hypertension Sister   . Hyperlipidemia Sister   . Diabetes Sister   . Heart disease Sister   . Hypertension Sister   . Hyperlipidemia Sister   . Diabetes Brother   . Heart disease Brother   . Hypertension Brother   . Hyperlipidemia Brother   . Colon cancer Neg Hx   . Esophageal cancer Neg Hx   . Stomach cancer Neg Hx   . Rectal cancer Neg Hx     Social History   Socioeconomic History  . Marital status: Widowed    Spouse name: Not on file  . Number of children: 0  . Years of education: college  . Highest education level: Not on file  Occupational History  . Occupation: retired  Tobacco Use  . Smoking status: Never  . Smokeless tobacco: Never  . Tobacco comments:    Never used tobacco  Vaping Use  . Vaping Use: Never used  Substance and Sexual Activity  . Alcohol use: No    Alcohol/week: 0.0 standard drinks  . Drug use: No  . Sexual activity: Never    Partners: Male    Birth control/protection: Post-menopausal    Comment: lives alone, no dietary restrictions except avoid fresh veg, fruit, whole  grains  Other Topics Concern  . Not on file  Social History Narrative   Patient was married ([name]) - widow   Patient does not have any children.   Patient is right-handed.   Patient has a BA degree.   One caffeine drink daily    Social Determinants of Health   Financial Resource Strain: Not on file  Food Insecurity: Not on file  Transportation Needs: Not on file  Physical Activity: Not on file  Stress: Not on file  Social Connections: Not on file  Intimate Partner Violence: Not  on file    Outpatient Medications Prior to Visit  Medication Sig Dispense Refill  . acetaminophen (TYLENOL) 325 MG tablet Take 2 tablets (650 mg total) by mouth every 6 (six) hours as needed for up to 30 doses for mild pain or moderate pain. 30 tablet 0  . ALPRAZolam (XANAX) 0.25 MG tablet Take 1-3 tablets (0.25-0.75 mg total) by mouth once as needed for up to 1 dose for anxiety (MRI). 6 tablet 0  . amLODipine (NORVASC) 5 MG tablet TAKE 1 TABLET BY MOUTH  DAILY 90 tablet 3  . betamethasone valerate ointment (VALISONE) 0.1 % Apply qod to affected area, small amount 45 g 1  . Blood Glucose Monitoring Suppl (ONETOUCH ULTRALINK) w/Device KIT Use to check blood sugars BID E11.69 1 kit 0  . cholecalciferol (VITAMIN D) 1000 UNITS tablet Take 1,000 Units by mouth daily.    Marland Kitchen dicyclomine (BENTYL) 10 MG capsule TAKE 1 CAPSULE BY MOUTH 4  TIMES DAILY AS NEEDED 240 capsule 0  . furosemide (LASIX) 20 MG tablet Take 1 tablet (20 mg total) by mouth daily as needed. 90 tablet 1  . gabapentin (NEURONTIN) 100 MG capsule 3 capsules daily, one in the morning, and two at night. 90 capsule 5  . hydrALAZINE (APRESOLINE) 10 MG tablet Take 10 mg by mouth as needed.    . Lancets (ONETOUCH ULTRASOFT) lancets USE AS DIRECTED 3 TIMES  DAILY 300 each 3  . losartan (COZAAR) 50 MG tablet TAKE 1 TABLET BY MOUTH  TWICE DAILY 180 tablet 3  . metFORMIN (GLUCOPHAGE-XR) 500 MG 24 hr tablet TAKE 1 TABLET BY MOUTH IN  THE MORNING AND AT BEDTIME  180 tablet 3  . metoCLOPramide (REGLAN) 5 MG tablet TAKE 1 TABLET BY MOUTH  TWICE DAILY 180 tablet 0  . metoprolol tartrate (LOPRESSOR) 50 MG tablet TAKE 1 AND 1/2 TABLETS BY  MOUTH TWICE DAILY 270 tablet 3  . NEXIUM 40 MG capsule Take 1 capsule (40 mg total) by mouth daily before breakfast. 90 capsule 3  . ondansetron (ZOFRAN) 4 MG tablet Take 4 mg by mouth every 4 (four) hours as needed. For nausea    . ONETOUCH ULTRA test strip CHECK BLOOD SUGAR 3 TIMES  DAILY OR AS NEEDED. 300 strip 3  . potassium chloride (KLOR-CON) 10 MEQ tablet TAKE 1 TABLET BY MOUTH  DAILY 90 tablet 3  . rosuvastatin (CRESTOR) 20 MG tablet TAKE 1 TABLET BY MOUTH  DAILY 90 tablet 3   No facility-administered medications prior to visit.    Allergies  Allergen Reactions  . Tramadol Other (See Comments)    Dizziness     Review of Systems  Constitutional:  Negative for fever and malaise/fatigue.  HENT:  Positive for ear pain (left). Negative for congestion.   Eyes:  Negative for redness.  Respiratory:  Negative for shortness of breath.   Cardiovascular:  Positive for chest pain. Negative for palpitations and leg swelling.  Gastrointestinal:  Positive for abdominal pain. Negative for blood in stool and nausea.  Genitourinary:  Negative for dysuria and frequency.  Musculoskeletal:  Negative for falls.  Skin:  Negative for rash.  Neurological:  Positive for dizziness. Negative for loss of consciousness and headaches.  Endo/Heme/Allergies:  Negative for polydipsia.  Psychiatric/Behavioral:  Negative for depression. The patient is not nervous/anxious.       Objective:    Physical Exam Constitutional:      General: She is not in acute distress.    Appearance: She is well-developed.  HENT:     Head: Normocephalic and atraumatic.  Eyes:     Conjunctiva/sclera: Conjunctivae normal.  Neck:     Thyroid: No thyromegaly.  Cardiovascular:     Rate and Rhythm: Normal rate and regular rhythm.     Heart sounds:  Normal heart sounds. No murmur heard. Pulmonary:     Effort: Pulmonary effort is normal. No respiratory distress.     Breath sounds: Normal breath sounds.  Abdominal:     General: Bowel sounds are normal. There is no distension.     Palpations: Abdomen is soft. There is no mass.     Tenderness: There is no abdominal tenderness.  Musculoskeletal:     Cervical back: Neck supple.  Lymphadenopathy:     Cervical: No cervical adenopathy.  Skin:    General: Skin is warm and dry.  Neurological:     Mental Status: She is alert and oriented to person, place, and time.  Psychiatric:        Behavior: Behavior normal.    BP 110/66   Pulse 64   Temp 97.8 F (36.6 C)   Resp 16   Wt 166 lb 3.2 oz (75.4 kg)   LMP 02/07/1992   SpO2 97%   BMI 31.40 kg/m  Wt Readings from Last 3 Encounters:  11/01/20 166 lb 3.2 oz (75.4 kg)  08/19/20 166 lb 6.4 oz (75.5 kg)  08/11/20 164 lb 6 oz (74.6 kg)    Diabetic Foot Exam - Simple   No data filed    Lab Results  Component Value Date   WBC 12.1 (H) 07/13/2020   HGB 13.1 07/13/2020   HCT 37.8 07/13/2020   PLT 197.0 07/13/2020   GLUCOSE 139 (H) 07/29/2020   CHOL 213 (H) 06/28/2020   TRIG 129.0 06/28/2020   HDL 78.20 06/28/2020   LDLDIRECT 95.0 03/25/2020   LDLCALC 109 (H) 06/28/2020   ALT 29 07/29/2020   AST 23 07/29/2020   NA 132 (L) 07/29/2020   K 4.2 07/29/2020   CL 94 (L) 07/29/2020   CREATININE 0.75 07/29/2020   BUN 19 07/29/2020   CO2 28 07/29/2020   TSH 1.71 06/28/2020   INR 0.88 01/16/2011   HGBA1C 6.6 (H) 06/28/2020   MICROALBUR 1.6 11/13/2014    Lab Results  Component Value Date   TSH 1.71 06/28/2020   Lab Results  Component Value Date   WBC 12.1 (H) 07/13/2020   HGB 13.1 07/13/2020   HCT 37.8 07/13/2020   MCV 86.9 07/13/2020   PLT 197.0 07/13/2020   Lab Results  Component Value Date   NA 132 (L) 07/29/2020   K 4.2 07/29/2020   CHLORIDE 101 07/08/2015   CO2 28 07/29/2020   GLUCOSE 139 (H) 07/29/2020   BUN  19 07/29/2020   CREATININE 0.75 07/29/2020   BILITOT 0.8 07/29/2020   ALKPHOS 59 07/29/2020   AST 23 07/29/2020   ALT 29 07/29/2020   PROT 7.0 07/29/2020   ALBUMIN 4.6 07/29/2020   CALCIUM 10.0 07/29/2020   ANIONGAP 9 06/16/2020   EGFR 75 (L) 07/08/2015   GFR 75.77 07/29/2020   Lab Results  Component Value Date   CHOL 213 (H) 06/28/2020   Lab Results  Component Value Date   HDL 78.20 06/28/2020   Lab Results  Component Value Date   LDLCALC 109 (H) 06/28/2020   Lab Results  Component Value Date   TRIG 129.0 06/28/2020   Lab Results  Component Value Date   CHOLHDL 3 06/28/2020  Lab Results  Component Value Date   HGBA1C 6.6 (H) 06/28/2020       Assessment & Plan:   Problem List Items Addressed This Visit     Diabetes mellitus type 2 in obese (HCC) (Chronic)    hgba1c acceptable, minimize simple carbs. Increase exercise as tolerated. Continue current meds      Relevant Orders   Lipid panel   Hemoglobin A1c   Lipid panel   Hemoglobin A1c   Essential hypertension (Chronic)    Well controlled, no changes to meds. Encouraged heart healthy diet such as the DASH diet and exercise as tolerated.       Relevant Orders   CBC   Comprehensive metabolic panel   TSH   CBC   Comprehensive metabolic panel   TSH   Anemia (Chronic)   Relevant Orders   Iron, TIBC and Ferritin Panel   Iron, TIBC and Ferritin Panel   HYPERCHOLESTEROLEMIA    Encourage heart healthy diet such as MIND or DASH diet, increase exercise, avoid trans fats, simple carbohydrates and processed foods, consider a krill or fish or flaxseed oil cap daily. Tolerating Rosuvastatin      Abnormal brain CT    She is offerred repeat imaging today but declines for now she will let us know if she changes her mind      Vitamin D deficiency    Supplement and monitor      Relevant Orders   VITAMIN D 25 Hydroxy (Vit-D Deficiency, Fractures)   VITAMIN D 25 Hydroxy (Vit-D Deficiency, Fractures)   Iron  deficiency anemia   Relevant Orders   Iron, TIBC and Ferritin Panel   Iron, TIBC and Ferritin Panel   Diabetic peripheral neuropathy (HCC)    Still having pain and burning in her feet worse when they are more swollen encouraged to minimize sodium, elevate feet, stay as active as able.       Headache   Renal insufficiency    Hydrate and monitor      Left-sided headache    Her pain still recurs but has improved greatly. She will follow up with neurology      Trigeminal neuralgia    She does not feel Gabapentin has helped and she has had enough side effects that she is preparing to stop it so she will drop from 300 to 200 mg daily x 1 month then 1 tab daily x 1 month then stop      Pedal edema   Thiamine deficiency - Primary    Supplement and monitor      Relevant Orders   Vitamin B1   CBC   Vitamin B1   Other Visit Diagnoses     High risk medication use       Relevant Orders   Drug Monitoring Panel [phone] , Urine        No orders of the defined types were placed in this encounter.   I,Zite Okoli,acting as a Education administrator for Penni Homans, MD.,have documented all relevant documentation on the behalf of Penni Homans, MD,as directed by  Penni Homans, MD while in the presence of Penni Homans, MD.   I, [name], MD., personally preformed the services described in this documentation.  All medical record entries made by the scribe were at my direction and in my presence.  I have reviewed the chart and discharge instructions (if applicable) and agree that the record reflects my personal performance and is accurate and complete. 11/01/2020

## 2020-11-01 NOTE — Assessment & Plan Note (Signed)
hgba1c acceptable, minimize simple carbs. Increase exercise as tolerated. Continue current meds 

## 2020-11-01 NOTE — Assessment & Plan Note (Signed)
She is offerred repeat imaging today but declines for now she will let us know if she changes her mind

## 2020-11-01 NOTE — Assessment & Plan Note (Signed)
Her pain still recurs but has improved greatly. She will follow up with neurology

## 2020-11-02 LAB — LIPID PANEL
Cholesterol: 190 mg/dL (ref 0–200)
HDL: 65.5 mg/dL (ref >39.00)
LDL Cholesterol: 84 mg/dL (ref 0–99)
NonHDL: 124.4
Total CHOL/HDL Ratio: 3
Triglycerides: 200 mg/dL — ABNORMAL HIGH (ref 0.0–149.0)
VLDL: 40 mg/dL (ref 0.0–40.0)

## 2020-11-02 LAB — COMPREHENSIVE METABOLIC PANEL
ALT: 20 U/L (ref 0–35)
AST: 20 U/L (ref 0–37)
Albumin: 4.7 g/dL (ref 3.5–5.2)
Alkaline Phosphatase: 71 U/L (ref 39–117)
BUN: 23 mg/dL (ref 6–23)
CO2: 26 mEq/L (ref 19–32)
Calcium: 10.1 mg/dL (ref 8.4–10.5)
Chloride: 95 mEq/L — ABNORMAL LOW (ref 96–112)
Creatinine, Ser: 0.8 mg/dL (ref 0.40–1.20)
GFR: 69.99 mL/min (ref >60.00)
Glucose, Bld: 119 mg/dL — ABNORMAL HIGH (ref 70–99)
Potassium: 4.4 mEq/L (ref 3.5–5.1)
Sodium: 133 mEq/L — ABNORMAL LOW (ref 135–145)
Total Bilirubin: 0.6 mg/dL (ref 0.2–1.2)
Total Protein: 7.3 g/dL (ref 6.0–8.3)

## 2020-11-02 LAB — HEMOGLOBIN A1C: Hgb A1c MFr Bld: 6.3 % (ref 4.6–6.5)

## 2020-11-02 LAB — CBC
HCT: 37.7 % (ref 36.0–46.0)
Hemoglobin: 12.6 g/dL (ref 12.0–15.0)
MCHC: 33.6 g/dL (ref 30.0–36.0)
MCV: 87.4 fl (ref 78.0–100.0)
Platelets: 217 10*3/uL (ref 150.0–400.0)
RBC: 4.31 Mil/uL (ref 3.87–5.11)
RDW: 12.1 % (ref 11.5–15.5)
WBC: 8.8 10*3/uL (ref 4.0–10.5)

## 2020-11-02 LAB — VITAMIN D 25 HYDROXY (VIT D DEFICIENCY, FRACTURES): VITD: 49.54 ng/mL (ref 30.00–100.00)

## 2020-11-02 LAB — TSH: TSH: 2.16 u[IU]/mL (ref 0.35–5.50)

## 2020-11-03 ENCOUNTER — Telehealth: Payer: Self-pay | Admitting: *Deleted

## 2020-11-03 LAB — DRUG MONITORING PANEL 376104, URINE
Amphetamines: NEGATIVE ng/mL (ref <500)
Barbiturates: NEGATIVE ng/mL (ref <300)
Benzodiazepines: NEGATIVE ng/mL (ref <100)
Cocaine Metabolite: NEGATIVE ng/mL (ref <150)
Desmethyltramadol: NEGATIVE ng/mL (ref <100)
Opiates: NEGATIVE ng/mL (ref <100)
Oxycodone: NEGATIVE ng/mL (ref <100)
Tramadol: NEGATIVE ng/mL (ref <100)

## 2020-11-03 LAB — DM TEMPLATE

## 2020-11-03 NOTE — Chronic Care Management (AMB) (Signed)
  Chronic Care Management   Note  11/03/2020 Name: [name] MRN: [id number] DOB: Aug 22, 1940  [name] is a 80 y.o. year old female who is a primary care patient of [name], MD. I reached out to [employer] by phone today in response to a referral sent by [name]'s PCP.  [name] was given information about Chronic Care Management services today including:  CCM service includes personalized support from designated clinical staff supervised by her physician, including individualized plan of care and coordination with other care providers 24/7 contact phone numbers for assistance for urgent and routine care needs. Service will only be billed when office clinical staff spend 20 minutes or more in a month to coordinate care. Only one practitioner may furnish and bill the service in a calendar month. The patient may stop CCM services at any time (effective at the end of the month) by phone call to the office staff. The patient is responsible for co-pay (up to 20% after annual deductible is met) if co-pay is required by the individual health plan.   Patient agreed to services and verbal consent obtained.   Follow up plan: Telephone appointment with care management team member scheduled for: 11/09/2020  Julian Hy, [facility]  Direct Dial: [phone]

## 2020-11-04 LAB — IRON,TIBC AND FERRITIN PANEL
%SAT: 20 % (calc) (ref 16–45)
Ferritin: 427 ng/mL — ABNORMAL HIGH (ref 16–288)
Iron: 67 ug/dL (ref 45–160)
TIBC: 339 mcg/dL (calc) (ref 250–450)

## 2020-11-04 LAB — VITAMIN B1: Vitamin B1 (Thiamine): 77 nmol/L — ABNORMAL HIGH (ref 8–30)

## 2020-11-09 ENCOUNTER — Ambulatory Visit (INDEPENDENT_AMBULATORY_CARE_PROVIDER_SITE_OTHER): Payer: Medicare Other

## 2020-11-09 DIAGNOSIS — I10 Essential (primary) hypertension: Secondary | ICD-10-CM

## 2020-11-09 DIAGNOSIS — E7800 Pure hypercholesterolemia, unspecified: Secondary | ICD-10-CM

## 2020-11-09 DIAGNOSIS — E1169 Type 2 diabetes mellitus with other specified complication: Secondary | ICD-10-CM

## 2020-11-09 NOTE — Patient Instructions (Signed)
Visit Information   PATIENT GOALS:   Goals Addressed             This Visit's Progress    Monitor and Manage My Health Conditions       Timeframe:  Long-Range Goal Priority:  Medium Start Date:  11/09/2020                           Expected End Date:  05/10/2021                     Follow Up Date 12/09/2020    Patient Goals: - check blood sugar at prescribed times and check blood sugar if you feel it is too high or too low - take the blood sugar log and meter to all doctor visits - Eat healthy: eat plenty of vegetables, fruits, lean protein, healthy fats,  low salt meals. - manage portion size -contact your insurance provider for information on transportation and over the counter benefit. You may be able to obtain a new blood pressure monitor through this benefit. -attend provider visits as scheduled -review educational material and plan to discuss at next telephone call. - work with care management team for ongoing care management and care coordination needs.     Why is this important?   Checking your blood sugar at home helps to keep it from getting very high or very low.  Writing the results in a diary or log helps the doctor know how to care for you.  Your blood sugar log should have the time, date and the results.  Also, write down the amount of insulin or other medicine that you take.  Other information, like what you ate, exercise done and how you were feeling, will also be helpful.           Consent to CCM Services: [name] was given information about Chronic Care Management services including:  CCM service includes personalized support from designated clinical staff supervised by her physician, including individualized plan of care and coordination with other care providers 24/7 contact phone numbers for assistance for urgent and routine care needs. Service will only be billed when office clinical staff spend 20 minutes or more in a month to coordinate  care. Only one practitioner may furnish and bill the service in a calendar month. The patient may stop CCM services at any time (effective at the end of the month) by phone call to the office staff. The patient will be responsible for cost sharing (co-pay) of up to 20% of the service fee (after annual deductible is met).  Patient agreed to services and verbal consent obtained.   The patient verbalized understanding of instructions, educational materials, and care plan provided today and agreed to receive a mailed copy of patient instructions, educational materials, and care plan.   Telephone follow up appointment with care management team member scheduled for: The patient has been provided with contact information for the care management team and has been advised to call with any health related questions or concerns.   Valerie Silversmith, RN, MSN, BSN, CCM Care Management Coordinator [facility] [phone]   CLINICAL CARE PLAN: Patient Care Plan: Diabetes Type 2 (Adult)     Problem Identified: knowledge deficit related to long term care plan for managment of diabetes in a patient with chronic disease: HTN, hypercholesterolemia; gastroparesis; IBS, stomach tumor, anemia,trigeminal neuralgia   Priority: Medium     Long-Range Goal:  Disease Progression Prevented or Minimized   Start Date: 11/09/2020  Expected End Date: 05/10/2021  Priority: Medium  Note:   Objective:  Lab Results  Component Value Date   HGBA1C 6.3 11/01/2020   Lab Results  Component Value Date   CREATININE 0.80 11/01/2020   CREATININE 0.75 07/29/2020   CREATININE 0.83 07/13/2020   Lab Results  Component Value Date   EGFR 75 (L) 07/08/2015  Objective:  Last practice recorded BP readings:  BP Readings from Last 3 Encounters:  11/01/20 110/66  08/19/20 122/66  08/11/20 120/70   Most recent eGFR/CrCl:  Lab Results  Component Value Date   EGFR 75 (L) 07/08/2015  Current Barriers:  Knowledge  Deficits related long term care plan for management of diabetes in a patient with history of: HTN, hypercholesterolemia, iron deficiency anemia, gastroparesis, IBS, stomach tumor. Patient states she lives alone. Has support of her older sister, but limited due to her age and her sister is caring for her husband. History of diabetes-patient states she take checks her blood sugar three times a day, but does not record. Today blood sugar was 120 this morning. She reports she can tell when blood sugar is low or too high. Provider recommendation to take metformin once a day. Patient states she is aware, but has not decreased to once a day. She states that she feels like she still needs to take the metformin twice a day. Also patient reports she is decreasing gabapentin-RNCM reinforced patient instruction per after visit summary. [name] is receptive to clinical pharmacist referral for medication questions; adherence. Limited Social Support Does not adhere to prescribed medication regimen Lacks social connections Case Manager Clinical Goal(s):  patient will demonstrate improved adherence to prescribed treatment plan for diabetes self care/management as evidenced by: improved daily monitoring and recording of CBG, adherence to ADA/ carb modified diet, increase activity, improved adherence to prescribed medication regimen, contacting provider for new or worsened symptoms or questions Interventions:  Collaboration with [name], MD regarding development and update of comprehensive plan of care as evidenced by provider attestation and co-signature Inter-disciplinary care team collaboration (see longitudinal plan of care) Reviewed medications with patient and discussed importance of medication adherence Discussed plans with patient for ongoing care management follow up and provided patient with direct contact information for care management team Provided patient with written educational materials related  to diabetes Reviewed scheduled/upcoming provider appointments. Discussed transportation- patient states she does not need at this time. patient declines transportation resources. However RNCM encouraged her to call her insurance provider to get information of their transportation benefit. aLso encouraged her to call RNCM if she finds that she is without transportation to a provider appointment. Referral made to pharmacy team for assistance with medication adherence and medication questions Self-Care Activities - Attends all scheduled provider appointments Patient Goals: - check blood sugar at prescribed times and check blood sugar if you feel it is too high or too low - take the blood sugar log and meter to all doctor visits - Eat healthy: eat plenty of vegetables, fruits, lean protein, healthy fats,  low salt meals. - manage portion size -contact your insurance provider for information on transportation and over the counter benefit. You may be able to obtain a new blood pressure monitor through this benefit. -attend provider visits as scheduled -review educational material and plan to discuss at next telephone call. - work with care management team for ongoing care management and care coordination needs.  Follow Up Plan: Telephone  follow up appointment with care management team member scheduled for: next month. The patient has been provided with contact information for the care management team and has been advised to call with any health related questions or concerns.

## 2020-11-09 NOTE — Chronic Care Management (AMB) (Signed)
Chronic Care Management   CCM RN Visit Note  11/09/2020 Name: [name] MRN: [id number] DOB: February 13, 1940  Subjective: [name] is a 80 y.o. year old female who is a primary care patient of [name], MD. The care management team was consulted for assistance with disease management and care coordination needs.    Engaged with patient by telephone for initial visit in response to provider referral for case management and/or care coordination services.   Consent to Services:  The patient was given the following information about Chronic Care Management services today, agreed to services, and gave verbal consent: 1. CCM service includes personalized support from designated clinical staff supervised by the primary care provider, including individualized plan of care and coordination with other care providers 2. 24/7 contact phone numbers for assistance for urgent and routine care needs. 3. Service will only be billed when office clinical staff spend 20 minutes or more in a month to coordinate care. 4. Only one practitioner may furnish and bill the service in a calendar month. 5.The patient may stop CCM services at any time (effective at the end of the month) by phone call to the office staff. 6. The patient will be responsible for cost sharing (co-pay) of up to 20% of the service fee (after annual deductible is met). Patient agreed to services and consent obtained.  Patient agreed to services and verbal consent obtained.   Assessment: Review of patient past medical history, allergies, medications, health status, including review of consultants reports, laboratory and other test data, was performed as part of comprehensive evaluation and provision of chronic care management services.   SDOH (Social Determinants of Health) assessments and interventions performed:  SDOH Interventions    Flowsheet Row Most Recent Value  SDOH Interventions   Food Insecurity Interventions  Intervention Not Indicated  Transportation Interventions Intervention Not Indicated        CCM Care Plan  Allergies  Allergen Reactions   Tramadol Other (See Comments)    Dizziness     Outpatient Encounter Medications as of 11/09/2020  Medication Sig   acetaminophen (TYLENOL) 325 MG tablet Take 2 tablets (650 mg total) by mouth every 6 (six) hours as needed for up to 30 doses for mild pain or moderate pain.   amLODipine (NORVASC) 5 MG tablet TAKE 1 TABLET BY MOUTH  DAILY   aspirin EC 81 MG tablet Take 81 mg by mouth daily. Swallow whole.   betamethasone valerate ointment (VALISONE) 0.1 % Apply qod to affected area, small amount   cholecalciferol (VITAMIN D) 1000 UNITS tablet Take 1,000 Units by mouth daily.   dicyclomine (BENTYL) 10 MG capsule TAKE 1 CAPSULE BY MOUTH 4  TIMES DAILY AS NEEDED   furosemide (LASIX) 20 MG tablet Take 1 tablet (20 mg total) by mouth daily as needed.   gabapentin (NEURONTIN) 100 MG capsule 3 capsules daily, one in the morning, and two at night.   losartan (COZAAR) 50 MG tablet TAKE 1 TABLET BY MOUTH  TWICE DAILY   metFORMIN (GLUCOPHAGE-XR) 500 MG 24 hr tablet TAKE 1 TABLET BY MOUTH IN  THE MORNING AND AT BEDTIME   metoCLOPramide (REGLAN) 5 MG tablet TAKE 1 TABLET BY MOUTH  TWICE DAILY   metoprolol tartrate (LOPRESSOR) 50 MG tablet TAKE 1 AND 1/2 TABLETS BY  MOUTH TWICE DAILY   NEXIUM 40 MG capsule Take 1 capsule (40 mg total) by mouth daily before breakfast.   ondansetron (ZOFRAN) 4 MG tablet Take 4 mg by mouth  every 4 (four) hours as needed. For nausea   potassium chloride (KLOR-CON) 10 MEQ tablet TAKE 1 TABLET BY MOUTH  DAILY   rosuvastatin (CRESTOR) 20 MG tablet TAKE 1 TABLET BY MOUTH  DAILY   ALPRAZolam (XANAX) 0.25 MG tablet Take 1-3 tablets (0.25-0.75 mg total) by mouth once as needed for up to 1 dose for anxiety (MRI). (Patient not taking: Reported on 11/09/2020)   Blood Glucose Monitoring Suppl (ONETOUCH ULTRALINK) w/Device KIT Use to check blood  sugars BID E11.69   hydrALAZINE (APRESOLINE) 10 MG tablet Take 10 mg by mouth as needed. (Patient not taking: Reported on 11/09/2020)   Lancets (ONETOUCH ULTRASOFT) lancets USE AS DIRECTED 3 TIMES  DAILY   ONETOUCH ULTRA test strip CHECK BLOOD SUGAR 3 TIMES  DAILY OR AS NEEDED.   No facility-administered encounter medications on file as of 11/09/2020.    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   Renal insufficiency 06/18/2019   Educated about COVID-19 virus infection 05/15/2019   Atrophic vaginitis 01/20/2019   Stenosis of carotid artery 11/12/2018   Anxiety 10/13/2018   Thrush 12/07/2017   Sinusitis 10/11/2017   Headache 07/12/2017   Dizzy spells 11/21/2016   Constipation 11/21/2016   Nonrheumatic aortic valve stenosis 05/23/2016   Aortic atherosclerosis (HCC) 05/23/2016   Hematuria 03/16/2016   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   Pain of upper abdomen 06/06/2015   Neck pain 04/22/2015   Ear pain 02/28/2015   Abnormal urine 11/21/2014   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   Anemia 06/08/2014   RLS (restless legs syndrome) 11/23/2013   Diabetic peripheral neuropathy ([place]) 10/29/2013   Left-sided thoracic back pain 10/06/2013   Encounter for preventative adult health care exam with abnormal findings 09/14/2013   Iron deficiency anemia    Status post laparoscopy 02/25/2013   Hyponatremia 01/09/2013   GERD (gastroesophageal reflux disease) 01/09/2013   Amaurosis fugax of left eye 10/16/2012   Low back pain 06/03/2012   Vitamin D deficiency 03/11/2012   Bilateral hand pain 10/20/2011   Encounter for long-term (current) use of other medications 10/20/2011   IBS (irritable bowel syndrome) 08/14/2011   TIA (transient ischemic  attack) 02/10/2011   Abnormal brain CT 01/19/2011   Allergic rhinitis 10/01/2010   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   FUNDIC GLAND POLYPS OF STOMACH 03/18/2010   Abdominal pain in female 03/18/2010   Pain in joint 56/81/2751   SYSTOLIC MURMUR 70/02/7492   Paroxysmal supraventricular tachycardia ([place]) 01/12/2009   PLANTAR FASCIITIS 06/08/2008   CHEST PAIN 05/18/2008   Gastroparesis 12/18/2007   HYPERCHOLESTEROLEMIA 06/11/2007   Diabetes mellitus type 2 in obese ([place]) 09/05/2006   Essential hypertension 09/05/2006    Conditions to be addressed/monitored:HTN, HLD, and DMII  Care Plan : Diabetes Type 2 (Adult)  Updates made by [name], RN since 11/09/2020 12:00 AM     Problem: knowledge deficit related to long term care plan for managment of diabetes in a patient with chronic disease: HTN, hypercholesterolemia; gastroparesis; IBS, stomach tumor, anemia,trigeminal neuralgia   Priority: Medium     Long-Range Goal: Disease Progression Prevented or Minimized   Start Date: 11/09/2020  Expected End Date: 05/10/2021  Priority: Medium  Note:   Objective:  Lab Results  Component Value Date  HGBA1C 6.3 11/01/2020   Lab Results  Component Value Date   CREATININE 0.80 11/01/2020   CREATININE 0.75 07/29/2020   CREATININE 0.83 07/13/2020   Lab Results  Component Value Date   EGFR 75 (L) 07/08/2015  Objective:  Last practice recorded BP readings:  BP Readings from Last 3 Encounters:  11/01/20 110/66  08/19/20 122/66  08/11/20 120/70   Most recent eGFR/CrCl:  Lab Results  Component Value Date   EGFR 75 (L) 07/08/2015  Current Barriers:  Knowledge Deficits related long term care plan for management of diabetes in a patient with history of: HTN, hypercholesterolemia, iron deficiency anemia, gastroparesis, IBS, stomach tumor. Patient states she lives alone and is able to complete ADL's without difficulty. Has limited support from her older sister. History of  diabetes-patient states she take checks her blood sugar three times a day, but does not record. Today blood sugar was 120 this morning. She reports she can tell when blood sugar is low or too high. Provider recommendation to take metformin once a day. Patient states she is aware, but has not decreased to once a day. Also patient reports she is decreasing gabapentin-RNCM reinforced patient instruction per after visit summary. Ms. Durall is receptive to clinical pharmacist referral for medication questions; adherence. Limited Social Support Does not adhere to prescribed medication regimen Lacks social connections Case Manager Clinical Goal(s):  patient will demonstrate improved adherence to prescribed treatment plan for diabetes self care/management as evidenced by: improved daily monitoring and recording of CBG, adherence to ADA/ carb modified diet, increase activity, improved adherence to prescribed medication regimen, contacting provider for new or worsened symptoms or questions Interventions:  Collaboration with [name], MD regarding development and update of comprehensive plan of care as evidenced by provider attestation and co-signature Inter-disciplinary care team collaboration (see longitudinal plan of care) Reviewed medications with patient and discussed importance of medication adherence Discussed plans with patient for ongoing care management follow up and provided patient with direct contact information for care management team Provided patient with written educational materials related to diabetes Reviewed scheduled/upcoming provider appointments. Discussed transportation- patient states she does not need at this time. patient declines transportation resources. However RNCM encouraged her to call her insurance provider to get information of their transportation benefit. aLso encouraged her to call RNCM if she finds that she is without transportation to a provider  appointment. Referral made to pharmacy team for assistance with medication adherence and medication questions Self-Care Activities - Attends all scheduled provider appointments Patient Goals: - check blood sugar at prescribed times and check blood sugar if you feel it is too high or too low - take the blood sugar log and meter to all doctor visits - Eat healthy: eat plenty of vegetables, fruits, lean protein, healthy fats,  low salt meals. - manage portion size -contact your insurance provider for information on transportation and over the counter benefit. You may be able to obtain a new blood pressure monitor through this benefit. -attend provider visits as scheduled -review educational material and plan to discuss at next telephone call. - work with care management team for ongoing care management and care coordination needs.  Follow Up Plan: Telephone follow up appointment with care management team member scheduled for: next month. The patient has been provided with contact information for the care management team and has been advised to call with any health related questions or concerns.     Plan:Telephone follow up appointment with care management team member scheduled for:  next month and The patient has been provided with contact information for the care management team and has been advised to call with any health related questions or concerns.   Thea Silversmith, RN, MSN, BSN, CCM Care Management Coordinator [facility] [phone]

## 2020-11-10 ENCOUNTER — Telehealth: Payer: Self-pay | Admitting: *Deleted

## 2020-11-10 NOTE — Chronic Care Management (AMB) (Signed)
  Chronic Care Management   Note  11/10/2020 Name: [name] MRN: [id number] DOB: 08-22-1940  [name] is a 80 y.o. year old female who is a primary care patient of [name], MD. [name] is currently enrolled in care management services. An additional referral for Pharm D was placed.   Follow up plan: Telephone appointment with care management team member scheduled for: 11/16/2020  Julian Hy, [facility]  Direct Dial: [phone]

## 2020-11-11 ENCOUNTER — Telehealth: Payer: Self-pay

## 2020-11-11 NOTE — Telephone Encounter (Signed)
Pt would like to know what she could do to keep iron normal.

## 2020-11-12 ENCOUNTER — Telehealth: Payer: Self-pay | Admitting: Pharmacist

## 2020-11-12 NOTE — Telephone Encounter (Signed)
Pt aware.

## 2020-11-12 NOTE — Chronic Care Management (AMB) (Cosign Needed)
Chronic Care Management Pharmacy Assistant   Name: ABRIE EGLOFF  MRN: [id number] DOB: Dec 28, 1940  [name] No is an 80 y.o. year old female who presents for his initial CCM visit with the clinical pharmacist.  Recent office visits:  11/01/20-Stacey A. Charlett Blake, MD (PCP) General follow up visit. Labs ordered. Patient states Gabapentin has not helped and she has had enough side effects that she is preparing to stop it so she will drop from 300 to 200 mg daily x 1 month then 1 tab daily x 1 month then stop. Drop Metformin to 1 tab daily. Follow up in 3 months. 08/19/20-Stacey A. Charlett Blake, MD (PCP) Seen for general follow up visit. Follow up in 7 weeks. 08/11/20-Nicholas Nolene Ebbs, DO. Seen for sore throat. OK to take Tylenol 1000 mg (2 extra strength tabs) or 975 mg (3 regular strength tabs) every 6 hours as needed. Consider throat lozenges, salt water gargles and an air humidifier for symptomatic care.  07/06/20-Stacey A. Charlett Blake, MD (PCP) Seen for inflammation and numbness on the left side of her head. Labs ordered. Follow up in 6 weeks. 06/28/20-Stacey A. Charlett Blake, MD (PCP) Notes not available. 06/22/20-Stacey A. Charlett Blake, MD (PCP) Seen for Numbness. Ambulatory referral to Neurology. Labs ordered..  Recent consult visits:  08/03/20-(Neurology) Stephan Minister. Tomi Likens, DO  Consult visit for headache. Start gabapentin - increase dose as instructed. MRI ordered. Follow up in 6 months. 06/16/20- (Oncology) Eliezer Bottom, NP Hematology and Oncology follow up visit. Follow up in 3 months. 05/26/20-(Gastroenterology) Gatha Mayer, MD  Seen for abdominal pain. Patient was given a testing kit to check for small intestine bacterial overgrowth. 05/14/20-(Ophthalmology) Lonia Skinner  Notes not available.  Hospital visits:  Medication Reconciliation was completed by comparing discharge summary, patient's EMR and Pharmacy list, and upon discussion with patient.  Admitted to the hospital on  08/03/20 due to Chronic left shoulder pain. Discharge date was 08/03/20. Discharged from [place]?Medications Started at [facility] Discharge:?? -started  Oxycodone 5 mg every 8 hours PRN Tylenol 325 mg every 6 hours PRN  Medication Changes at Hospital Discharge: -Changed None noted  Medications Discontinued at Hospital Discharge: -Stopped None noted  Medications that remain the same after Hospital Discharge:??  -All other medications will remain the same.    Medications: Outpatient Encounter Medications as of 11/12/2020  Medication Sig   acetaminophen (TYLENOL) 325 MG tablet Take 2 tablets (650 mg total) by mouth every 6 (six) hours as needed for up to 30 doses for mild pain or moderate pain.   ALPRAZolam (XANAX) 0.25 MG tablet Take 1-3 tablets (0.25-0.75 mg total) by mouth once as needed for up to 1 dose for anxiety (MRI). (Patient not taking: Reported on 11/09/2020)   amLODipine (NORVASC) 5 MG tablet TAKE 1 TABLET BY MOUTH  DAILY   aspirin EC 81 MG tablet Take 81 mg by mouth daily. Swallow whole.   betamethasone valerate ointment (VALISONE) 0.1 % Apply qod to affected area, small amount   Blood Glucose Monitoring Suppl (ONETOUCH ULTRALINK) w/Device KIT Use to check blood sugars BID E11.69   cholecalciferol (VITAMIN D) 1000 UNITS tablet Take 1,000 Units by mouth daily.   dicyclomine (BENTYL) 10 MG capsule TAKE 1 CAPSULE BY MOUTH 4  TIMES DAILY AS NEEDED   furosemide (LASIX) 20 MG tablet Take 1 tablet (20 mg total) by mouth daily as needed.   gabapentin (NEURONTIN) 100 MG capsule 3 capsules daily, one in the morning, and  two at night.   hydrALAZINE (APRESOLINE) 10 MG tablet Take 10 mg by mouth as needed. (Patient not taking: Reported on 11/09/2020)   Lancets (ONETOUCH ULTRASOFT) lancets USE AS DIRECTED 3 TIMES  DAILY   losartan (COZAAR) 50 MG tablet TAKE 1 TABLET BY MOUTH  TWICE DAILY   metFORMIN (GLUCOPHAGE-XR) 500 MG 24 hr tablet TAKE 1 TABLET BY MOUTH IN   THE MORNING AND AT BEDTIME   metoCLOPramide (REGLAN) 5 MG tablet TAKE 1 TABLET BY MOUTH  TWICE DAILY   metoprolol tartrate (LOPRESSOR) 50 MG tablet TAKE 1 AND 1/2 TABLETS BY  MOUTH TWICE DAILY   NEXIUM 40 MG capsule Take 1 capsule (40 mg total) by mouth daily before breakfast.   ondansetron (ZOFRAN) 4 MG tablet Take 4 mg by mouth every 4 (four) hours as needed. For nausea   ONETOUCH ULTRA test strip CHECK BLOOD SUGAR 3 TIMES  DAILY OR AS NEEDED.   potassium chloride (KLOR-CON) 10 MEQ tablet TAKE 1 TABLET BY MOUTH  DAILY   rosuvastatin (CRESTOR) 20 MG tablet TAKE 1 TABLET BY MOUTH  DAILY   No facility-administered encounter medications on file as of 11/12/2020.   Acetaminophen (TYLENOL) 325 MG tablet Last filled:None noted ALPRAZolam (XANAX) 0.25 MG tablet Last filled:08/03/20 2 DS AmLODipine (NORVASC) 5 MG tablet Last filled:10/12/20 90 DS Aspirin EC 81 MG tablet Last filled:None noted Betamethasone valerate ointment (VALISONE) 0.1 % Last filled:04/08/19 30 DS Cholecalciferol (VITAMIN D) 1000 UNITS tablet Last filled:None noted Dicyclomine (BENTYL) 10 MG capsule Last filled:08/25/20 60 DS Furosemide (LASIX) 20 MG tablet Last filled:08/19/20 90 DS Gabapentin (NEURONTIN) 100 MG capsule Last filled:10/04/20 90 DS HydrALAZINE (APRESOLINE) 10 MG tablet Last filled:09/17/18 90 DS Losartan (COZAAR) 50 MG tablet Last filled:08/12/20 90 DS MetFORMIN (GLUCOPHAGE-XR) 500 MG 24 hr tablet Last filled:09/09/20 90 DS MetoCLOPramide (REGLAN) 5 MG tablet Last filled:07/20/2 90 DS Metoprolol tartrate (LOPRESSOR) 50 MG tablet Last filled:08/20/20 90 DS NEXIUM 40 MG capsule Last filled:None noted Ondansetron (ZOFRAN) 4 MG tablet Last filled:None noted Potassium chloride (KLOR-CON) 10 MEQ tablet Last filled:10/12/20 90 DS Rosuvastatin (CRESTOR) 20 MG tablet Last filled:10/19/20 90 DS   Care Gaps: FOOT EXAM:Overdue since 11/13/2015  OPHTHALMOLOGY EXAM:Last completed: Feb 28, 2019 COVID-19 Vaccine:Last  completed: Dec 29, 2019 INFLUENZA VACCINE:Last completed: Nov 18, 2019  Star Rating Drugs: Losartan (COZAAR) 50 MG tablet Last filled:08/12/20 90 DS MetFORMIN (GLUCOPHAGE-XR) 500 MG 24 hr tablet Last filled:09/09/20 90 DS Rosuvastatin (CRESTOR) 20 MG tablet Last filled:10/19/20 90 DS  Myriam Elta [place], [place]

## 2020-11-16 ENCOUNTER — Ambulatory Visit: Payer: Medicare Other | Admitting: Pharmacist

## 2020-11-16 DIAGNOSIS — K3184 Gastroparesis: Secondary | ICD-10-CM

## 2020-11-16 DIAGNOSIS — G500 Trigeminal neuralgia: Secondary | ICD-10-CM

## 2020-11-16 DIAGNOSIS — I10 Essential (primary) hypertension: Secondary | ICD-10-CM

## 2020-11-16 DIAGNOSIS — G459 Transient cerebral ischemic attack, unspecified: Secondary | ICD-10-CM

## 2020-11-16 DIAGNOSIS — D500 Iron deficiency anemia secondary to blood loss (chronic): Secondary | ICD-10-CM

## 2020-11-16 DIAGNOSIS — E7800 Pure hypercholesterolemia, unspecified: Secondary | ICD-10-CM

## 2020-11-16 DIAGNOSIS — E519 Thiamine deficiency, unspecified: Secondary | ICD-10-CM

## 2020-11-16 DIAGNOSIS — E1169 Type 2 diabetes mellitus with other specified complication: Secondary | ICD-10-CM

## 2020-11-17 ENCOUNTER — Other Ambulatory Visit (HOSPITAL_BASED_OUTPATIENT_CLINIC_OR_DEPARTMENT_OTHER): Payer: Self-pay

## 2020-11-21 NOTE — Progress Notes (Signed)
Cardiology Office Note   Date:  11/23/2020   ID:  [name], DOB 02/11/1940, MRN [id number]  PCP:  [name], MD  Cardiologist:   Minus Breeding, MD   Chief Complaint  Patient presents with   Fatigue       History of Present Illness: [name] is a 80 y.o. female who  presents for followup of SVT.   Since I last saw her she has started on gabapentin.  I see a mention of possible trigeminal neuralgia.  She is try to get off of this medicine and she feels fatigued.  She has many somatic complaints but nothing that is routine or reproducible.  She has some vague chest discomfort and occasional shortness of breath.  She has some occasional palpitations.  However, there is been no significant change since her stress test in 2020.  She said no presyncope or syncope.   Past Medical History:  Diagnosis Date   Abdominal pain in female 03/18/2010   Qualifier: Diagnosis of  By: Carlean Purl MD, Dimas Millin    Anemia 06/08/2014   Anxiety    Arthritis    Spinal Osteoarthritis   Cancer ([place])    Carcinoid tumor of stomach    Cataract    Chest pain    Myoview 12/15 no ischemia.   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 ([place]) 09/05/2006   Qualifier: Diagnosis of  By: Marca Ancona RMA, Lucy     Diabetic peripheral neuropathy ([place]) 10/29/2013   Encounter for preventative adult health care exam with abnormal findings 09/14/2013   Esophageal reflux    Gastric polyp    Fundic Gland   Gastroparesis    Headache(784.0)    Heart murmur    Echocardiogram 2/11: EF 60-65%, mild LAE, grade 1 diastolic dysfunction, aortic valve sclerosis, mean gradient 9 mm of mercury, PASP 34   Hematuria 03/16/2016   Iron deficiency anemia, unspecified    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   Leg swelling    bilateral   Neck pain 04/22/2015   PONV (postoperative nausea and vomiting)    pt states only needs small amount of  anesthesia   PSVT (paroxysmal supraventricular tachycardia) ([place])    Pure hypercholesterolemia    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   Renal insufficiency 06/18/2019   Stroke ([place])    tia, 2014   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   Type II or unspecified type diabetes mellitus without mention of complication, not stated as uncontrolled    Unspecified essential hypertension    Unspecified hereditary and idiopathic peripheral neuropathy 10/29/2013    Past Surgical History:  Procedure Laterality Date   CHOLECYSTECTOMY  1993   COLONOSCOPY  11/11/2010   diverticulosis   DILATATION & CURRETTAGE/HYSTEROSCOPY WITH RESECTOCOPE N/A 02/25/2013   Procedure: Attempted hysteroscopy with uterine perforation;  Surgeon: Jamey Reas de Berton Lan, MD;  Location: [facility];  Service: Gynecology;  Laterality: N/A;   ESOPHAGOGASTRODUODENOSCOPY  08/29/2010; 09/15/2010   Carcinoid tumor less than 1 cm in July 2012 not seen in August 2012 , gastritis, fundic gland polyps   ESOPHAGOGASTRODUODENOSCOPY  05/16/2011   ESOPHAGOGASTRODUODENOSCOPY  06/14/2012   EUS  12/15/2010   Procedure: UPPER ENDOSCOPIC ULTRASOUND (EUS) LINEAR;  Surgeon: Owens Loffler, MD;  Location: [facility];  Service: Endoscopy;  Laterality: N/A;   EYE SURGERY Bilateral    Bi lateral cateracts and bi lateral laser   LAPAROSCOPY  N/A 02/25/2013   Procedure: Cystoscopy and laparoscopy with fulguration of uterine serosa;  Surgeon: Jamey Reas de Berton Lan, MD;  Location: [facility];  Service: Gynecology;  Laterality: N/A;   TONSILLECTOMY       Current Outpatient Medications  Medication Sig Dispense Refill   acetaminophen (TYLENOL) 325 MG tablet Take 2 tablets (650 mg total) by mouth every 6 (six) hours as needed for up to 30 doses for mild pain or moderate pain. 30 tablet 0   amLODipine (NORVASC) 5 MG tablet TAKE 1 TABLET BY MOUTH  DAILY 90 tablet 3   aspirin EC 81 MG tablet Take 81 mg by mouth daily. Swallow whole.     betamethasone valerate ointment  (VALISONE) 0.1 % Apply qod to affected area, small amount 45 g 1   Blood Glucose Monitoring Suppl (ONETOUCH ULTRALINK) w/Device KIT Use to check blood sugars BID E11.69 1 kit 0   cholecalciferol (VITAMIN D) 1000 UNITS tablet Take 1,000 Units by mouth daily.     dicyclomine (BENTYL) 10 MG capsule TAKE 1 CAPSULE BY MOUTH 4  TIMES DAILY AS NEEDED 240 capsule 0   furosemide (LASIX) 20 MG tablet Take 1 tablet (20 mg total) by mouth daily as needed. 90 tablet 1   gabapentin (NEURONTIN) 100 MG capsule 3 capsules daily, one in the morning, and two at night. (Patient taking differently: Take 200 mg by mouth at bedtime.) 90 capsule 5   hydrALAZINE (APRESOLINE) 10 MG tablet Take 10 mg by mouth as needed.     Lancets (ONETOUCH ULTRASOFT) lancets USE AS DIRECTED 3 TIMES  DAILY 300 each 3   losartan (COZAAR) 50 MG tablet TAKE 1 TABLET BY MOUTH  TWICE DAILY 180 tablet 3   metFORMIN (GLUCOPHAGE-XR) 500 MG 24 hr tablet TAKE 1 TABLET BY MOUTH IN  THE MORNING AND AT BEDTIME (Patient taking differently: Take 500 mg by mouth daily with breakfast.) 180 tablet 3   metoCLOPramide (REGLAN) 5 MG tablet TAKE 1 TABLET BY MOUTH  TWICE DAILY (Patient taking differently: Take 5 mg by mouth 2 (two) times daily as needed.) 180 tablet 0   metoprolol tartrate (LOPRESSOR) 50 MG tablet TAKE 1 AND 1/2 TABLETS BY  MOUTH TWICE DAILY 270 tablet 3   NEXIUM 40 MG capsule Take 1 capsule (40 mg total) by mouth daily before breakfast. 90 capsule 3   ondansetron (ZOFRAN) 4 MG tablet Take 4 mg by mouth every 4 (four) hours as needed. For nausea     ONETOUCH ULTRA test strip CHECK BLOOD SUGAR 3 TIMES  DAILY OR AS NEEDED. 300 strip 3   potassium chloride (KLOR-CON) 10 MEQ tablet TAKE 1 TABLET BY MOUTH  DAILY 90 tablet 3   rosuvastatin (CRESTOR) 20 MG tablet TAKE 1 TABLET BY MOUTH  DAILY 90 tablet 3   thiamine (VITAMIN B-1) 100 MG tablet Take 100 mg by mouth every other day.     No current facility-administered medications for this visit.     Allergies:   Tramadol    ROS:  Please see the history of present illness.   Otherwise, review of systems are positive for none.   All other systems are reviewed and negative.    PHYSICAL EXAM: VS:  BP 130/70   Pulse 60   Ht 5' 1"  (1.549 m)   Wt 166 lb 12.8 oz (75.7 kg)   LMP 02/07/1992   SpO2 97%   BMI 31.52 kg/m  , BMI Body mass index is 31.52 kg/m. GENERAL:  Well appearing NECK:  No jugular venous distention, waveform within normal limits, carotid upstroke brisk and symmetric, no bruits, no thyromegaly LUNGS:  Clear to auscultation bilaterally CHEST:  Unremarkable HEART:  PMI not displaced or sustained,S1 and S2 within normal limits, no S3, no S4, no clicks, no rubs, no murmurs ABD:  Flat, positive bowel sounds normal in frequency in pitch, no bruits, no rebound, no guarding, no midline pulsatile mass, no hepatomegaly, no splenomegaly EXT:  2 plus pulses throughout, no edema, no cyanosis no clubbing    EKG:  EKG is  ordered today. Sinus rhythm, rate 60, axis within normal limits, intervals within normal limits, no acute ST-T wave changes.  Recent Labs: 11/01/2020: ALT 20; BUN 23; Creatinine, Ser 0.80; Hemoglobin 12.6; Platelets 217.0; Potassium 4.4; Sodium 133; TSH 2.16    Lipid Panel    Component Value Date/Time   CHOL 190 11/01/2020 1441   TRIG 200.0 (H) 11/01/2020 1441   TRIG 94 11/16/2005 0905   HDL 65.50 11/01/2020 1441   CHOLHDL 3 11/01/2020 1441   VLDL 40.0 11/01/2020 1441   LDLCALC 84 11/01/2020 1441   LDLCALC 116 (H) 11/13/2019 0906   LDLDIRECT 95.0 03/25/2020 1149      Wt Readings from Last 3 Encounters:  11/23/20 166 lb 12.8 oz (75.7 kg)  11/01/20 166 lb 3.2 oz (75.4 kg)  08/19/20 166 lb 6.4 oz (75.5 kg)      Other studies Reviewed: Additional studies/ records that were reviewed today include: Labs. Review of the above records demonstrates:  Please see elsewhere in the note.     ASSESSMENT AND PLAN:    SVT:    She has had no  symptomatic recurrence of this.  No change in therapy.    Hypertension - Her blood pressure is at target.  No change in therapy.   Dyslipidemia -  Her LDL is 84 with an HDL of 65.  No change in therapy.   Carotid stenosis - This was minimal in the spring.  No follow up indicated.    Aortic stenosis- This is very mild in the past and not evident on the most recent echo in 2019.   No change in therapy.  No change in exam to suggest progression.  Aortic atherosclerosis - She continues with primary risk reduction.    Current medicines are reviewed at length with the patient today.  The patient does not have concerns regarding medicines.  The following changes have been made:   None  Labs/ tests ordered today include:    Orders Placed This Encounter  Procedures   EKG 12-Lead      Disposition:   FU with me 12 months.     Signed, Minus Breeding, MD  11/23/2020 2:43 PM    [place]

## 2020-11-21 NOTE — Patient Instructions (Signed)
Visit Information   PATIENT GOALS:   Goals Addressed             This [place] for Chronic Care Management        Pharmacist Clinical Goal(s):  Over the next 90 days, patient will achieve adherence to monitoring guidelines and medication adherence to achieve therapeutic efficacy achieve control of hyperlipidmeia  as evidenced by LDL <70 maintain control of type 2 diabetes and hypertension  as evidenced by A1c <7.0, FBG 80 to 130 and post prandial blood glucose <180 and blood pressure <140/90  adhere to prescribed medication regimen as evidenced by refill history and self reporting through collaboration with PharmD and provider.   Interventions: 1:1 collaboration with [name], MD regarding development and update of comprehensive plan of care as evidenced by provider attestation and co-signature Inter-disciplinary care team collaboration (see longitudinal plan of care) Comprehensive medication review performed; medication list updated in electronic medical record  Diabetes: A1c is at goal of <7.0% however patient reports home blood glucose has not been at goal Last A1c was 6.3% (11/01/2020) Current treatment: Metformin ER 543m - take 2 tablet daily  Interventions:  Discussed last labs and recommendation to lower dose of metformin ER to 505mdaily  Education provided about recognition and treatment of hypoglycemiz If you have a low blood sugar less than 70, please eat 15 grams of carbohydrates (4 oz of juice, soda, 4 glucose tablets, or 3-4 pieces of hard candy). Wait 15 minutes and then recheck your blood sugar. If your sugar is still less than 70, eat another 15 grams of carbohydrates. Wait another 15 minutes and recheck your glucose. Continue this until your sugar is over 70. Then, eat a snack like crackers with peanut butter, apply with peanut butter or yogurt to prevent your sugar from dropping again. Discussed maybe doing 2 weeks of  professional CGM to blood glucose trends (patient states she is not sure she could download app to use her cell phone to scan reader) Continue to check blood glucose at least once a day at varying times of day.  Reviewed home blood glucose readings and reviewed goals  Fasting blood glucose goal (before meals) = 80 to 130 Blood glucose goal after a meal = less than 180   Hypertension / Paroxysmal SVT: Controlled per last office visits but patient states blood pressure at home has been elevated Blood pressure goal < 140/90  BP Readings from Last 3 Encounters:  11/01/20 110/66  08/19/20 122/66  08/11/20 120/70  Current treatment: Metoprolol 5014mwice a day Amlodipine 5mg65mily in evening Hydralazine 10mg28mneeded for elevated blood pressure (has not taken recently)  Losartan 50mg 49me a day  Interventions:  Reviewed blood pressure goals Continue to check blood pressure at home once a day Reminded patient to make sure she has sat / rested for at least 5 mintues before checking blood pressure at home.  Continue current blood pressure medications  Hyperlipidemia / history of TIA / stenosis of carotid artery: Uncontrolled LDL goal < 70 LDL was 84 (11/01/2020) Current treatment:  Rosuvastatin 20mg d24m Aspirin 81mg da46m Interventions:  Reviewed refill history - filled 90 days supply of rosuvastatin 05/05/2020, 07/29/2020 and 10/19/2020.  Recommended limit intake of saturated and trans fat.  If LDL still > 70 at next lipid check, consider addition of ezetimibe 10mg dai76m Gastroparesis / Irritable Bowel Syndrome / Acid reflux:  Goal: decrease acid reflux symptoms  and improve abdominal discomfort Current treatment:  Ondansetron 15m - take 1 tablet every 4 hour as needed for nausea Dicyclomine 13mup to 4 times a day as needed (taking less than daily)  Esomeprazole 4026maily before breakfast (not taking regularly) Metoclopramide 5mg26mice a day Interventions:  Eat small  frequent meals due to gastroparesis Can take esomeprazole 20 or 40mg58mr the counter for acid reflux Also can take pepcid / famotidine 20mg 32might if needed for acid reflux Continue to follow up with Dr GessneCarlean Purlopathy / trigeminal neuralgia:  Goal; decrease nerve / facial pain while minimizing side effects Current treatment:  Gabapentin 100mg -30me 3 capsules daily - 1 each morning and 2 each night at bedtime Patient report she would like to get off of gabapentin because she is unsure that she needs it. Currently taking only 200mg da6mat night Plan is to decreased to 100mg aft45m month, then stop - patient would like to stop sooner Interventions:  Discussed quicker taper. Can take gabapentin 200mg at b43mme for 1 more week, then decrease to 100mg for 136mk, then stop Notify office if any increase in facial / nerve pain.   Anemia / Low B1 (thiamin):   Last CBC Lab Results  Component Value Date   WBC 8.8 11/01/2020   HGB 12.6 11/01/2020   HCT 37.7 11/01/2020   MCV 87.4 11/01/2020   MCH 30.0 06/16/2020   RDW 12.1 11/01/2020   PLT 217.0 11/01/2020  Last B1 was elevated at 77 (11/01/2020) Patient reports she was only taking thiamin / B1 100mg every 5mr day at time of last lab Dr Blyth recommCharlett Blake cut dose of thiamin in half.  Interventions:   Recommend take thiamin 50mg every o28m day  Recheck thiamine level in 3 months   Patient Goals/Self-Care Activities Over the next 90 days, patient will:  take medications as prescribed,  check glucose daily , document, and provide at future appointments,  check blood pressure daily, document, and provide at future appointments lower dose for thiamin to 50mg every OT1mday lower dose of metformin to 500mg daily low26mose of gabapentin after 7 days to 100mg daily at b30mme for 7 days, then stop.   Follow Up Plan: Telephone follow up appointment with care management team member scheduled for:  2 weeks            Consent to CCM Services: Ms. Revell was givShevchenkomation about Chronic Care Management services including:  CCM service includes personalized support from designated clinical staff supervised by her physician, including individualized plan of care and coordination with other care providers 24/7 contact phone numbers for assistance for urgent and routine care needs. Service will only be billed when office clinical staff spend 20 minutes or more in a month to coordinate care. Only one practitioner may furnish and bill the service in a calendar month. The patient may stop CCM services at any time (effective at the end of the month) by phone call to the office staff. The patient will be responsible for cost sharing (co-pay) of up to 20% of the service fee (after annual deductible is met).  Patient agreed to services and verbal consent obtained.   Patient verbalizes understanding of instructions provided today and agrees to view in MyChart.   [place] City[place]ow up appointment with care management team member scheduled for: 2 weeks  Mathias Bogacki, PhCherre Robinsl Pharmacist [facility]

## 2020-11-21 NOTE — Chronic Care Management (AMB) (Signed)
Chronic Care Management Pharmacy Note  11/21/2020 Name:  [name] MRN:  [id number] DOB:  1940-07-29  Subjective: [name] is an 80 y.o. year old female who is a primary patient of [name], MD.  The CCM team was consulted for assistance with disease management and care coordination needs.    Engaged with patient by telephone for initial visit in response to provider referral for pharmacy case management and/or care coordination services.   Consent to Services:  The patient was given the following information about Chronic Care Management services today, agreed to services, and gave verbal consent: 1. CCM service includes personalized support from designated clinical staff supervised by the primary care provider, including individualized plan of care and coordination with other care providers 2. 24/7 contact phone numbers for assistance for urgent and routine care needs. 3. Service will only be billed when office clinical staff spend 20 minutes or more in a month to coordinate care. 4. Only one practitioner may furnish and bill the service in a calendar month. 5.The patient may stop CCM services at any time (effective at the end of the month) by phone call to the office staff. 6. The patient will be responsible for cost sharing (co-pay) of up to 20% of the service fee (after annual deductible is met). Patient agreed to services and consent obtained.  Patient Care Team: [name], MD as PCP - General (Family Medicine) Minus Breeding, MD as PCP - Cardiology (Cardiology) Gatha Mayer, MD as Consulting Physician (Gastroenterology) Love, Alyson Locket, MD (Neurology) Pieter Partridge, DO as Consulting Physician (Neurology) [name], RN as Case Manager [name], PharmD (Pharmacist)  Recent office visits: Recent office visits:  11/01/20-Dr Charlett Blake (PCP) General follow up visit. Labs ordered. Patient states Gabapentin has not helped and she has had enough  side effects that she is preparing to stop it so she will drop from 300 to 200 mg daily x 1 month then 1 tab daily x 1 month then stop. Drop Metformin to 1 tab daily. Follow up in 3 months. 08/19/20-Dr Charlett Blake (PCP) Seen for general follow up visit. Follow up in 7 weeks. 08/11/20- Dr Nani Ravens (PCP office)  Seen for sore throat. OK to take Tylenol 1000 mg (2 extra strength tabs) or 975 mg (3 regular strength tabs) every 6 hours as needed. Consider throat lozenges, salt water gargles and an air humidifier for symptomatic care.  07/06/20-Dr Charlett Blake (PCP) Seen for inflammation and numbness on the left side of her head. Labs ordered. Follow up in 6 weeks. 06/22/20-Dr Charlett Blake (PCP) Seen for Numbness. Ambulatory referral to Neurology. Labs ordered..   Recent consult visits:  08/03/20-(Neurology) Stephan Minister. Tomi Likens, DO  Consult visit for headache. Start gabapentin - increase dose as instructed. MRI ordered. Follow up in 6 months. 06/16/20- Hematology [facility]) Anemia follow up visit.  05/26/20-(Gastroenterology) Gatha Mayer, MD  Seen for abdominal pain. Patient was given a testing kit to check for small intestine bacterial overgrowth. 05/14/20-(Ophthalmology) Lonia Skinner  Notes not available.   Hospital visits:   08/03/20 ED visit at [facility]. due to Chronic left shoulder pain.   New?Medications Started at [facility] Discharge:?? Oxycodone 5 mg every 8 hours PRN Tylenol 325 mg every 6 hours PRN   Medication Changes at Hospital Discharge: None noted   Medications Discontinued at Hospital Discharge: None noted  Objective:  Lab Results  Component Value Date   CREATININE 0.80 11/01/2020   CREATININE 0.75  07/29/2020   CREATININE 0.83 07/13/2020    Lab Results  Component Value Date   HGBA1C 6.3 11/01/2020   Last diabetic Eye exam:  Lab Results  Component Value Date/Time   HMDIABEYEEXA No Retinopathy 02/28/2019 12:00 AM    Last diabetic Foot exam: No results found for:  HMDIABFOOTEX      Component Value Date/Time   CHOL 190 11/01/2020 1441   TRIG 200.0 (H) 11/01/2020 1441   TRIG 94 11/16/2005 0905   HDL 65.50 11/01/2020 1441   CHOLHDL 3 11/01/2020 1441   VLDL 40.0 11/01/2020 1441   LDLCALC 84 11/01/2020 1441   LDLCALC 116 (H) 11/13/2019 0906   LDLDIRECT 95.0 03/25/2020 1149    Hepatic Function Latest Ref Rng & Units 11/01/2020 07/29/2020 07/13/2020  Total Protein 6.0 - 8.3 g/dL 7.3 7.0 6.9  Albumin 3.5 - 5.2 g/dL 4.7 4.6 4.5  AST 0 - 37 U/L 20 23 20   ALT 0 - 35 U/L 20 29 35  Alk Phosphatase 39 - 117 U/L 71 59 46  Total Bilirubin 0.2 - 1.2 mg/dL 0.6 0.8 1.0  Bilirubin, Direct 0.0 - 0.3 mg/dL - - -    Lab Results  Component Value Date/Time   TSH 2.16 11/01/2020 02:41 PM   TSH 1.71 06/28/2020 09:15 AM   FREET4 0.97 08/08/2019 11:57 AM    CBC Latest Ref Rng & Units 11/01/2020 07/13/2020 07/06/2020  WBC 4.0 - 10.5 K/uL 8.8 12.1(H) 16.4(H)  Hemoglobin 12.0 - 15.0 g/dL 12.6 13.1 13.4  Hematocrit 36.0 - 46.0 % 37.7 37.8 39.0  Platelets 150.0 - 400.0 K/uL 217.0 197.0 259.0    Lab Results  Component Value Date/Time   VD25OH 49.54 11/01/2020 02:41 PM   VD25OH 48.29 06/28/2020 10:41 AM    Clinical ASCVD: Yes  The 10-year ASCVD risk score (Arnett DK, et al., 2019) is: 42.3%   Values used to calculate the score:     Age: 32 years     Sex: Female     Is Non-Hispanic African American: No     Diabetic: Yes     Tobacco smoker: No     Systolic Blood Pressure: 536 mmHg     Is BP treated: Yes     HDL Cholesterol: 65.5 mg/dL     Total Cholesterol: 190 mg/dL    Other:   DEXA 04/26/2020: Femur Neck Right  T-score +0.6. L3 was excluded due to degenerative changes  AP Spine L1-L4 (L3)  Normal +1.9  DualFemur Neck Right Normal +0.6   Social History   Tobacco Use  Smoking Status Never  Smokeless Tobacco Never  Tobacco Comments   Never used tobacco   BP Readings from Last 3 Encounters:  11/01/20 110/66  08/19/20 122/66  08/11/20 120/70    Pulse Readings from Last 3 Encounters:  11/01/20 64  08/19/20 69  08/11/20 62   Wt Readings from Last 3 Encounters:  11/01/20 166 lb 3.2 oz (75.4 kg)  08/19/20 166 lb 6.4 oz (75.5 kg)  08/11/20 164 lb 6 oz (74.6 kg)    Assessment: Review of patient past medical history, allergies, medications, health status, including review of consultants reports, laboratory and other test data, was performed as part of comprehensive evaluation and provision of chronic care management services.   SDOH:  (Social Determinants of Health) assessments and interventions performed:  SDOH Interventions    Flowsheet Row Most Recent Value  SDOH Interventions   Financial Strain Interventions Intervention Not Indicated       CCM Care  Plan  Allergies  Allergen Reactions   Tramadol Other (See Comments)    Dizziness     Medications Reviewed Today     Reviewed by [name], PharmD (Pharmacist) on 11/16/20 at 1511  Med List Status: <None>   Medication Order Taking? Sig Documenting Provider Last Dose Status Informant  acetaminophen (TYLENOL) 325 MG tablet [id number] Yes Take 2 tablets (650 mg total) by mouth every 6 (six) hours as needed for up to 30 doses for mild pain or moderate pain. Wyvonnia Dusky, MD Taking Active   amLODipine (NORVASC) 5 MG tablet [id number] Yes TAKE 1 TABLET BY MOUTH  DAILY Minus Breeding, MD Taking Active   aspirin EC 81 MG tablet [id number] Yes Take 81 mg by mouth daily. Swallow whole. [provider] Taking Active Self  betamethasone valerate ointment (VALISONE) 0.1 % [id number] No Apply qod to affected area, small amount [name], MD Unknown Active   Blood Glucose Monitoring Suppl [facility]) w/Device KIT [id number] Yes Use to check blood sugars BID E11.69 [name], MD Taking Active   cholecalciferol (VITAMIN D) 1000 UNITS tablet [id number] Yes Take 1,000 Units by mouth daily. [provider] Taking Active Self  dicyclomine (BENTYL)  10 MG capsule [id number] Yes TAKE 1 CAPSULE BY MOUTH 4  TIMES DAILY AS NEEDED Gatha Mayer, MD Taking Active   furosemide (LASIX) 20 MG tablet [id number] Yes Take 1 tablet (20 mg total) by mouth daily as needed. [name], MD Taking Active   gabapentin (NEURONTIN) 100 MG capsule [id number] Yes 3 capsules daily, one in the morning, and two at night.  Patient taking differently: Take 200 mg by mouth at bedtime.   Pieter Partridge, DO Taking Active   hydrALAZINE (APRESOLINE) 10 MG tablet [id number] No Take 10 mg by mouth as needed.  Patient not taking: No sig reported   [provider] Not Taking Consider Medication Status and Discontinue   Lancets (ONETOUCH ULTRASOFT) lancets [id number] Yes USE AS DIRECTED 3 TIMES  DAILY [name], MD Taking Active   losartan (COZAAR) 50 MG tablet [id number] Yes TAKE 1 TABLET BY MOUTH  TWICE DAILY [name], MD Taking Active   metFORMIN (GLUCOPHAGE-XR) 500 MG 24 hr tablet [id number] Yes TAKE 1 TABLET BY MOUTH IN  THE MORNING AND AT BEDTIME  Patient taking differently: Take 500 mg by mouth daily with breakfast.   [name], MD Taking Active   metoCLOPramide (REGLAN) 5 MG tablet [id number] Yes TAKE 1 TABLET BY MOUTH  TWICE DAILY  Patient taking differently: Take 5 mg by mouth 2 (two) times daily as needed.   Gatha Mayer, MD Taking Active   metoprolol tartrate (LOPRESSOR) 50 MG tablet [id number] Yes TAKE 1 AND 1/2 TABLETS BY  MOUTH TWICE DAILY Minus Breeding, MD Taking Active   NEXIUM 40 MG capsule [id number] Yes Take 1 capsule (40 mg total) by mouth daily before breakfast. Gatha Mayer, MD Taking Active   ondansetron [facility]) 4 MG tablet [id number] Yes Take 4 mg by mouth every 4 (four) hours as needed. For nausea Renato Shin, MD Taking Active Self  [facility] ULTRA test strip [id number] Yes CHECK BLOOD SUGAR 3 TIMES  DAILY OR AS NEEDED. [name], MD Taking Active   potassium chloride (KLOR-CON) 10 MEQ tablet [id number] Yes TAKE 1  TABLET BY MOUTH  DAILY [name], MD Taking Active   rosuvastatin (CRESTOR) 20 MG tablet [id number] Yes TAKE 1 TABLET BY MOUTH  DAILY  [name], MD Taking Active   thiamine (VITAMIN B-1) 100 MG tablet [id number] Yes Take 100 mg by mouth every other day. 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   Renal insufficiency 06/18/2019   Educated about COVID-19 virus infection 05/15/2019   Atrophic vaginitis 01/20/2019   Stenosis of carotid artery 11/12/2018   Anxiety 10/13/2018   Thrush 12/07/2017   Sinusitis 10/11/2017   Headache 07/12/2017   Dizzy spells 11/21/2016   Constipation 11/21/2016   Nonrheumatic aortic valve stenosis 05/23/2016   Aortic atherosclerosis (HCC) 05/23/2016   Hematuria 03/16/2016   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   Pain of upper abdomen 06/06/2015   Neck pain 04/22/2015   Ear pain 02/28/2015   Abnormal urine 11/21/2014   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   Anemia 06/08/2014   RLS (restless legs syndrome) 11/23/2013   Diabetic peripheral neuropathy ([place]) 10/29/2013   Left-sided thoracic back pain 10/06/2013   Encounter for preventative adult health care exam with abnormal findings 09/14/2013   Iron deficiency anemia    Status post laparoscopy 02/25/2013   Hyponatremia 01/09/2013   GERD (gastroesophageal reflux disease) 01/09/2013   Amaurosis fugax of left eye 10/16/2012   Low back pain 06/03/2012   Vitamin D deficiency 03/11/2012   Bilateral hand pain 10/20/2011   Encounter for long-term (current) use of other medications 10/20/2011   IBS (irritable bowel syndrome) 08/14/2011   TIA (transient ischemic attack) 02/10/2011   Abnormal brain CT  01/19/2011   Allergic rhinitis 10/01/2010   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   FUNDIC GLAND POLYPS OF STOMACH 03/18/2010   Abdominal pain in female 03/18/2010   Pain in joint 65/99/3570   SYSTOLIC MURMUR 17/79/3903   Paroxysmal supraventricular tachycardia ([place]) 01/12/2009   PLANTAR FASCIITIS 06/08/2008   CHEST PAIN 05/18/2008   Gastroparesis 12/18/2007   HYPERCHOLESTEROLEMIA 06/11/2007   Diabetes mellitus type 2 in obese ([place]) 09/05/2006   Essential hypertension 09/05/2006    Immunization History  Administered Date(s) Administered   Fluad Quad(high Dose 65+) 11/18/2019   Influenza Split 10/31/2010, 11/28/2011   Influenza Whole 11/19/2007, 11/04/2008, 11/10/2009   Influenza, High Dose Seasonal PF 11/11/2015, 11/21/2016   Influenza,inj,Quad PF,6+ Mos 01/09/2013, 10/20/2013, 11/13/2014, 11/06/2018   PFIZER(Purple Top)SARS-COV-2 Vaccination 03/14/2019, 04/09/2019, 12/29/2019   Pneumococcal Conjugate-13 09/11/2013   Pneumococcal Polysaccharide-23 03/13/2007   Td 03/02/2005    Conditions to be addressed/monitored: HTN, HLD, DMII, and gastroparesis; IBS; GERD; iron malabsorption / anemia; history of low thiamin; neuropathy / trigeminal neuralgia; h/o TIA; carotid artery stenosis;   Care Plan : General Pharmacy (Adult)  Updates made by [name], PHARMD since 11/21/2020 12:00 AM     Problem: HTN, type 2 DM; h/o TIA; hyperlipidemia; aorotic valve stenosis; paroxysmal SVT; hyponatremia; gastroparesis; IBS; GERD; iron malabsortion.   Priority: High  Onset Date: 11/16/2020     Long-Range Goal: Provide education, support and care coordination for medication therapy and chronic conditions   Start Date: 11/16/2020  Priority: High  Note:   Current Barriers:  Unable to independently monitor therapeutic efficacy Unable to achieve control of hyperlipidemia  Unable to maintain control of type 2 diabetes and hypertension Has not made recommended  medication changes  following last appointment / labs with primary care physician  Pharmacist Clinical Goal(s):  Over the next 90 days, patient will achieve adherence to monitoring guidelines and medication adherence to achieve therapeutic efficacy achieve control of hyperlipidmeia  as evidenced by LDL <70 maintain control of type 2 diabetes and hypertension  as evidenced by A1c <7.0, FBG 80 to 130 and post prandial blood glucose <180 and blood pressure <140/90  adhere to prescribed medication regimen as evidenced by refill history and self reporting  through collaboration with PharmD and provider.   Interventions: 1:1 collaboration with [name], MD regarding development and update of comprehensive plan of care as evidenced by provider attestation and co-signature Inter-disciplinary care team collaboration (see longitudinal plan of care) Comprehensive medication review performed; medication list updated in electronic medical record  Diabetes: A1c is at goal of <7.0% however patient report home blood glucose has not been at goal Last A1c was 6.3% (11/01/2020) Current treatment: Metformin ER 566m - patient lab notes states she should decreased dose to take just 1 tablet a day however patient has not lower dose yet, still taking 1 tablet twice a day Current glucose readings: fasting glucose: 75 to 130, post prandial glucose: 150 to over 200 Patient reports she sometimes has lows and feels shaky and also has had some blood glucose over 200 after meals Reports hypoglycemic/hyperglycemic symptoms - reports episodes of feeling shaky.  Current meal patterns: limits salt intake and fast food. Also limiting intake of sugar containing foods. She does eat an egg every day.  Interventions:  Discussed last labs and recommendation to lower dose of metformin ER to 5066mdaily  Education provided about recognition and treatment of hypoglycemiz If you have a low blood sugar less than 70, please eat 15 grams of  carbohydrates (4 oz of juice, soda, 4 glucose tablets, or 3-4 pieces of hard candy). Wait 15 minutes and then recheck your blood sugar. If your sugar is still less than 70, eat another 15 grams of carbohydrates. Wait another 15 minutes and recheck your glucose. Continue this until your sugar is over 70. Then, eat a snack like crackers with peanut butter, apply with peanut butter or yogurt to prevent your sugar from dropping again. Discussed maybe doing 2 weeks of professional CGM to blood glucose trends (patient states she is not sure she could download app to use her cell phone to scan reader Continue to check blood glucose at least once a day at varying times of day.  Reviewed home blood glucose readings and reviewed goals  Fasting blood glucose goal (before meals) = 80 to 130 Blood glucose goal after a meal = less than 180   Hypertension / Paroxysmal SVT: Controlled per last office visits but patient states blood pressure at home has been elevated Home blood pressure systolic 11553 15748'Ond diastolic 60 - 70 Blood pressure goal < 140/90  BP Readings from Last 3 Encounters:  11/01/20 110/66  08/19/20 122/66  08/11/20 120/70  Current treatment: Metoprolol 5048mwice a day Amlodipine 5mg58mily in evening Hydralazine 10mg34mneeded for elevated blood pressure (has not taken recently)  Losartan 50mg 47me a day  Denies hypotensive/hypertensive symptoms Interventions:  Reviewed blood pressure goals Continue to check blood pressure at home once a day Reminded patient to make sure she has sat / rested for at least 5 mintues before checking blood pressure at home.  Continue current blood pressure medications  Hyperlipidemia / history of TIA /  stenosis of carotid artery: Uncontrolled LDL goal < 70 LDL was 84 (11/01/2020) Current treatment:  Rosuvastatin 4m daily Aspirin 8102mdaily  Interventions:  Reviewed refill history - filled 90 days supply of rosuvastatin 05/05/2020, 07/29/2020  and 10/19/2020.  Recommended limit intake of saturated and trans fat.  If LDL still > 70 at next lipid check, consider addition of ezetimibe 1037maily   Gastroparesis / Irritable Bowel Syndrome / Acid reflux:  Managed by Dr GesCarlean Purlt following any specific diet related to gastroparesis.  Current treatment:  Ondansetron 4mg14mtake 1 tablet every 4 hour as needed for nausea Dicyclomine 10mg28mto 4 times a day as needed (taking less than daily)  Esomeprazole 40mg 67my before breakfast (not taking regularly) Metoclopramide 5mg tw49m a day Interventions:  Eat small frequent meals due to gastroparesis Can take esomeprazole 20 or 40mg ov43mhe counter for acid reflux Also can take pepcid / famotidine 20mg at 81mt if needed for acid reflux Continue to follow up with Dr Gessner  Carlean Purlthy / trigeminal neuralgia:  Patient states she only had facial pain related to trigeminal neuralgia once Initially tried duloxetine 08/11/2020 Reports dizziness and drowsiness with gabapentin Current treatment:  Gabapentin 100mg - ta19m capsules daily - 1 each morning and 2 each night at bedtime Patient report she would like to get off of gabapentin because she is unsure that she needs it. Currently taking only 200mg daily36mnight Plan is to decreased to 100mg after 87mnth, then stop - patient would like to stop sooner Interventions:  Discussed quicker taper. Can take gabapentin 200mg at bedt59mfor 1 more week, then decrease to 100mg for 1 we10mthen stop Notify office if any increase in facial / nerve pain.   Anemia / Low B1 (thiamin):  Managed by hematology but patient has not been in awhile due to past cost of IV iron infusions  Last CBC Lab Results  Component Value Date   WBC 8.8 11/01/2020   HGB 12.6 11/01/2020   HCT 37.7 11/01/2020   MCV 87.4 11/01/2020   MCH 30.0 06/16/2020   RDW 12.1 11/01/2020   PLT 217.0 11/01/2020  Last B1 was elevated at 77 (11/01/2020) Patient reports she was  only taking thiamin / B1 100mg every oth42may at time of last lab Dr Blyth recommendCharlett Blaket dose of thiamin in half.  Interventions:   Recommend take thiamin 50mg every othe21my  Recheck thiamine level in 3 months   Patient Goals/Self-Care Activities Over the next 90 days, patient will:  take medications as prescribed,  check glucose daily , document, and provide at future appointments,  check blood pressure daily, document, and provide at future appointments lower dose for thiamin to 50mg every OTHER69m lower dose of metformin to 500mg daily lower 42m of gabapentin after 7 days to 100mg daily at bedt33mfor 7 days, then stop.   Follow Up Plan: Telephone follow up appointment with care management team member scheduled for:  2 weeks           Medication Assistance: None required.  Patient affirms current coverage meets needs.  Patient's preferred pharmacy is:  [facility] [phone] Fax: [phone] [phone] [address] Fax: [phone] [phone]  [address], [place] [address] Phone: [phone] Fax: [phone]  [facility] ([facility]) - [place], [place] [address] Phone: [phone] Fax: [phone]   Follow Up:  Patient agrees to Care Plan and Follow-up.  Plan: Telephone follow up appointment with care management team member scheduled for:  2 weeks  [name], PharmD Clinical Pharmacist [facility] [place] [facility]

## 2020-11-23 ENCOUNTER — Other Ambulatory Visit: Payer: Self-pay

## 2020-11-23 ENCOUNTER — Ambulatory Visit: Payer: Medicare Other | Admitting: Cardiology

## 2020-11-23 ENCOUNTER — Encounter: Payer: Self-pay | Admitting: Cardiology

## 2020-11-23 VITALS — BP 130/70 | HR 60 | Ht 61.0 in | Wt 166.8 lb

## 2020-11-23 DIAGNOSIS — E7800 Pure hypercholesterolemia, unspecified: Secondary | ICD-10-CM | POA: Diagnosis not present

## 2020-11-23 DIAGNOSIS — I10 Essential (primary) hypertension: Secondary | ICD-10-CM | POA: Diagnosis not present

## 2020-11-23 DIAGNOSIS — I471 Supraventricular tachycardia: Secondary | ICD-10-CM

## 2020-11-23 DIAGNOSIS — I700 Atherosclerosis of aorta: Secondary | ICD-10-CM | POA: Diagnosis not present

## 2020-11-23 NOTE — Patient Instructions (Signed)

## 2020-12-01 ENCOUNTER — Other Ambulatory Visit (HOSPITAL_BASED_OUTPATIENT_CLINIC_OR_DEPARTMENT_OTHER): Payer: Self-pay

## 2020-12-01 ENCOUNTER — Other Ambulatory Visit: Payer: Self-pay

## 2020-12-01 ENCOUNTER — Ambulatory Visit: Payer: Medicare Other | Admitting: Pharmacist

## 2020-12-01 DIAGNOSIS — E669 Obesity, unspecified: Secondary | ICD-10-CM

## 2020-12-01 DIAGNOSIS — I10 Essential (primary) hypertension: Secondary | ICD-10-CM

## 2020-12-01 DIAGNOSIS — K3184 Gastroparesis: Secondary | ICD-10-CM

## 2020-12-01 DIAGNOSIS — E519 Thiamine deficiency, unspecified: Secondary | ICD-10-CM

## 2020-12-01 DIAGNOSIS — G500 Trigeminal neuralgia: Secondary | ICD-10-CM

## 2020-12-01 MED ORDER — INFLUENZA VAC A&B SA ADJ QUAD 0.5 ML IM PRSY
PREFILLED_SYRINGE | INTRAMUSCULAR | 0 refills | Status: DC
  Filled 2020-12-01: qty 0.5, 1d supply, fill #0

## 2020-12-01 NOTE — Patient Instructions (Addendum)
[name] It was a pleasure seeing you today.  I have attached a summary of our visit today and information about your health goals.  If you have any questions or concerns, please feel free to contact me either at the phone number below or with a MyChart message.    [name], PharmD Clinical Pharmacist [facility] [phone] (direct line)  [phone] (main office number)  PATIENT GOALS:  Goals Addressed             This [name]   Not on track     Pharmacist Clinical Goal(s):  Over the next 90 days, patient will achieve adherence to monitoring guidelines and medication adherence to achieve therapeutic efficacy achieve control of hyperlipidmeia  as evidenced by LDL <70 maintain control of type 2 diabetes and hypertension  as evidenced by A1c <7.0, FBG 80 to 130 and post prandial blood glucose <180 and blood pressure <140/90  adhere to prescribed medication regimen as evidenced by refill history and self reporting through collaboration with PharmD and provider.   Interventions: 1:1 collaboration with Valerie Lukes, MD regarding development and update of comprehensive plan of care as evidenced by provider attestation and co-signature Inter-disciplinary care team collaboration (see longitudinal plan of care) Comprehensive medication review performed; medication list updated in electronic medical record  Diabetes: A1c is at goal of <7.0% however patient reports home blood glucose has not been at goal Last A1c was 6.3% (11/01/2020) Current treatment: Metformin ER 500mg  - take 2 tablets daily  Interventions:  Lower dose of metformin ER to 500mg  daily  Eat a small snack prior to bedtime to prevent low blood glucose during sleep Education provided about recognition and treatment of hypoglycemiz If you have a low blood sugar less than 70, please eat 15 grams of carbohydrates (4 oz  of juice, soda, 4 glucose tablets, or 3-4 pieces of hard candy). Wait 15 minutes and then recheck your blood sugar. If your sugar is still less than 70, eat another 15 grams of carbohydrates. Wait another 15 minutes and recheck your glucose. Continue this until your sugar is over 70. Then, eat a snack like crackers with peanut butter, apply with peanut butter or yogurt to prevent your sugar from dropping again. We have place a Continuous Glucose Monitor today - please call if you have any questions Call [name]'s office to make appointment for your yearly eye exam Reviewed home blood glucose readings and reviewed goals  Fasting blood glucose goal (before meals) = 80 to 130 Blood glucose goal after a meal = less than 180   Hypertension / Paroxysmal SVT: Controlled per last office visits but patient states blood pressure at home has been elevated Blood pressure goal < 140/90  BP Readings from Last 3 Encounters:  11/01/20 110/66  08/19/20 122/66  08/11/20 120/70  Current treatment: Metoprolol 50mg  twice a day Amlodipine 5mg  daily in evening Hydralazine 10mg  as needed for elevated blood pressure (has not taken recently)  Losartan 50mg  twice a day  Interventions:  Reviewed blood pressure goals Continue to check blood pressure at home once a day Reminded patient to make sure she has sat / rested for at least 5 mintues before checking blood pressure at home.  Continue current blood pressure medications  Hyperlipidemia / history of TIA / stenosis of carotid artery: Uncontrolled LDL goal < 70 LDL was 84 (11/01/2020) Current treatment:  Rosuvastatin 20mg  daily  Aspirin 81mg  daily  Interventions:  Reviewed refill history - filled 90 days supply of rosuvastatin 05/05/2020, 07/29/2020 and 10/19/2020.  Recommended limit intake of saturated and trans fat.  If LDL still > 70 at next lipid check, consider addition of ezetimibe 10mg  daily   Gastroparesis / Irritable Bowel Syndrome / Acid reflux:   Goal: decrease acid reflux symptoms and improve abdominal discomfort Current treatment:  Ondansetron 4mg  - take 1 tablet every 4 hour as needed for nausea Dicyclomine 10mg  up to 4 times a day as needed (taking less than daily)  Esomeprazole 40mg  daily before breakfast (not taking regularly) Metoclopramide 5mg  twice a day Interventions:  Eat small frequent meals due to gastroparesis Can take esomeprazole 20 or 40mg  over the counter for acid reflux Also can take pepcid / famotidine 20mg  at night if needed for acid reflux Continue to follow up with [name]  Neuropathy / trigeminal neuralgia:  Goal; decrease nerve / facial pain while minimizing side effects Current treatment:  Gabapentin 100mg  - take 3 capsules daily - 1 each morning and 2 each night at bedtime Patient report she would like to get off of gabapentin because she is unsure that she needs it. Currently taking only 200mg  daily at night Plan is to decreased to 100mg  after 1 month, then stop - patient would like to stop sooner Interventions:  Discussed quicker taper. Can take gabapentin 200mg  at bedtime for 1 more week, then decrease to 100mg  for 1 week, then stop Notify office if any increase in facial / nerve pain.   Anemia / Low B1 (thiamin):   Last CBC Lab Results  Component Value Date   WBC 8.8 11/01/2020   HGB 12.6 11/01/2020   HCT 37.7 11/01/2020   MCV 87.4 11/01/2020   MCH 30.0 06/16/2020   RDW 12.1 11/01/2020   PLT 217.0 11/01/2020  Last B1 was elevated at 77 (11/01/2020) Patient reports she was only taking thiamin / B1 100mg  every other day at time of last lab [name] recommended cut dose of thiamin in half.  Interventions:   Recommend take thiamin 50mg  every other day  Recheck thiamine level in 2 months   Health Maintenance:  Reviewed vaccination history and discussed benefits of annual flu, COVID booster and Singrix vaccinations Patient to get flu vaccine today Plan to get COVID booster in 2 to 3  weeks (she did not want to get flu and booster together)  Plan to get Shingrix series in 2023.   Patient Goals/Self-Care Activities Over the next 90 days, patient will:  take medications as prescribed,  check glucose daily , document, and provide at future appointments,  check blood pressure daily, document, and provide at future appointments lower dose for thiamin to 50mg  every OTHER day lower dose of metformin to 500mg  daily Stop gabapentin  Follow Up Plan: Telephone follow up appointment with care management team member scheduled for:  2 weeks           The patient verbalized understanding of instructions, educational materials, and care plan provided today and declined offer to receive copy of patient instructions, educational materials, and care plan.   Telephone follow up appointment with care management team member scheduled for: 2 weeks  [name], PharmD Clinical Pharmacist [facility]

## 2020-12-03 ENCOUNTER — Ambulatory Visit: Payer: Medicare Other | Admitting: Pharmacist

## 2020-12-03 DIAGNOSIS — K3184 Gastroparesis: Secondary | ICD-10-CM

## 2020-12-03 DIAGNOSIS — E1169 Type 2 diabetes mellitus with other specified complication: Secondary | ICD-10-CM

## 2020-12-03 NOTE — Chronic Care Management (AMB) (Signed)
Chronic Care Management Pharmacy Note  12/03/2020 Name:  [name] MRN:  [id number] DOB:  05/03/1940  Subjective: [name] is an 80 y.o. year old female who is a primary patient of [name], MD.  The CCM team was consulted for assistance with disease management and care coordination needs.    Engaged with patient by telephone for follow up visit in response to provider referral for pharmacy case management and/or care coordination services.   Consent to Services:  The patient was given information about Chronic Care Management services, agreed to services, and gave verbal consent prior to initiation of services.  Please see initial visit note for detailed documentation.   Patient Care Team: [name], MD as PCP - General (Family Medicine) Minus Breeding, MD as PCP - Cardiology (Cardiology) Gatha Mayer, MD as Consulting Physician (Gastroenterology) Love, Alyson Locket, MD (Neurology) Pieter Partridge, DO as Consulting Physician (Neurology) [name], RN as Case Manager [name], PharmD (Pharmacist)  Recent office visits:  11/01/20-Dr Charlett Blake (PCP) General follow up visit. Labs ordered. Patient states Gabapentin has not helped and she has had enough side effects that she is preparing to stop it so she will drop from 300 to 200 mg daily x 1 month then 1 tab daily x 1 month then stop. Drop Metformin to 1 tab daily. Follow up in 3 months. 08/19/20-Dr Charlett Blake (PCP) Seen for general follow up visit. Follow up in 7 weeks. 08/11/20- Dr Nani Ravens (PCP office)  Seen for sore throat. OK to take Tylenol 1000 mg (2 extra strength tabs) or 975 mg (3 regular strength tabs) every 6 hours as needed. Consider throat lozenges, salt water gargles and an air humidifier for symptomatic care.  07/06/20-Dr Charlett Blake (PCP) Seen for inflammation and numbness on the left side of her head. Labs ordered. Follow up in 6 weeks. 06/22/20-Dr Charlett Blake (PCP) Seen for Numbness. Ambulatory  referral to Neurology. Labs ordered..   Recent consult visits:  08/03/20-(Neurology) Stephan Minister. Tomi Likens, DO  Consult visit for headache. Start gabapentin - increase dose as instructed. MRI ordered. Follow up in 6 months. 06/16/20- Hematology [facility]) Anemia follow up visit.  05/26/20-(Gastroenterology) Gatha Mayer, MD  Seen for abdominal pain. Patient was given a testing kit to check for small intestine bacterial overgrowth.   Hospital visits:   08/03/20 ED visit at [facility] due to Chronic left shoulder pain.   New?Medications Started at [facility] Discharge:?? Oxycodone 5 mg every 8 hours PRN Tylenol 325 mg every 6 hours PRN   Medication Changes at Hospital Discharge: None noted   Medications Discontinued at Hospital Discharge: None noted  Objective:  Lab Results  Component Value Date   CREATININE 0.80 11/01/2020   CREATININE 0.75 07/29/2020   CREATININE 0.83 07/13/2020    Lab Results  Component Value Date   HGBA1C 6.3 11/01/2020   Last diabetic Eye exam:  Lab Results  Component Value Date/Time   HMDIABEYEEXA No Retinopathy 02/28/2019 12:00 AM    Last diabetic Foot exam: No results found for: HMDIABFOOTEX      Component Value Date/Time   CHOL 190 11/01/2020 1441   TRIG 200.0 (H) 11/01/2020 1441   TRIG 94 11/16/2005 0905   HDL 65.50 11/01/2020 1441   CHOLHDL 3 11/01/2020 1441   VLDL 40.0 11/01/2020 1441   LDLCALC 84 11/01/2020 1441   LDLCALC 116 (H) 11/13/2019 0906   LDLDIRECT 95.0 03/25/2020 1149    Hepatic Function Latest Ref Rng &  Units 11/01/2020 07/29/2020 07/13/2020  Total Protein 6.0 - 8.3 g/dL 7.3 7.0 6.9  Albumin 3.5 - 5.2 g/dL 4.7 4.6 4.5  AST 0 - 37 U/L _0 ALT 0 - 35 U/L 20 29 35  Alk Phosphatase 39 - 117 U/L 71 59 46  Total Bilirubin 0.2 - 1.2 mg/dL 0.6 0.8 1.0  Bilirubin, Direct 0.0 - 0.3 mg/dL - - -    Lab Results  Component Value Date/Time   TSH 2.16 11/01/2020 02:41 PM   TSH 1.71 06/28/2020 09:15 AM   FREET4  0.97 08/08/2019 11:57 AM    CBC Latest Ref Rng & Units 11/01/2020 07/13/2020 07/06/2020  WBC 4.0 - 10.5 K/uL 8.8 12.1(H) 16.4(H)  Hemoglobin 12.0 - 15.0 g/dL 12.6 13.1 13.4  Hematocrit 36.0 - 46.0 % 37.7 37.8 39.0  Platelets 150.0 - 400.0 K/uL 217.0 197.0 259.0    Lab Results  Component Value Date/Time   VD25OH 49.54 11/01/2020 02:41 PM   VD25OH 48.29 06/28/2020 10:41 AM    Clinical ASCVD: Yes  The 10-year ASCVD risk score (Arnett DK, et al., 2019) is: 53.7%   Values used to calculate the score:     Age: 79 years     Sex: Female     Is Non-Hispanic African American: No     Diabetic: Yes     Tobacco smoker: No     Systolic Blood Pressure: 384 mmHg     Is BP treated: Yes     HDL Cholesterol: 65.5 mg/dL     Total Cholesterol: 190 mg/dL    Other:   DEXA 04/26/2020: Femur Neck Right  T-score +0.6. L3 was excluded due to degenerative changes  AP Spine L1-L4 (L3)  Normal +1.9  DualFemur Neck Right Normal +0.6   Social History   Tobacco Use  Smoking Status Never  Smokeless Tobacco Never  Tobacco Comments   Never used tobacco   BP Readings from Last 3 Encounters:  11/23/20 130/70  11/01/20 110/66  08/19/20 122/66   Pulse Readings from Last 3 Encounters:  11/23/20 60  11/01/20 64  08/19/20 69   Wt Readings from Last 3 Encounters:  11/23/20 166 lb 12.8 oz (75.7 kg)  11/01/20 166 lb 3.2 oz (75.4 kg)  08/19/20 166 lb 6.4 oz (75.5 kg)    Assessment: Review of patient past medical history, allergies, medications, health status, including review of consultants reports, laboratory and other test data, was performed as part of comprehensive evaluation and provision of chronic care management services.   SDOH:  (Social Determinants of Health) assessments and interventions performed:     CCM Care Plan  Allergies  Allergen Reactions   Tramadol Other (See Comments)    Dizziness     Medications Reviewed Today     Reviewed by [name], PharmD (Pharmacist) on  12/03/20 at 1328  Med List Status: <None>   Medication Order Taking? Sig Documenting Provider Last Dose Status Informant  acetaminophen (TYLENOL) 325 MG tablet [id number] Yes Take 2 tablets (650 mg total) by mouth every 6 (six) hours as needed for up to 30 doses for mild pain or moderate pain. Wyvonnia Dusky, MD Taking Active   amLODipine (NORVASC) 5 MG tablet [id number] Yes TAKE 1 TABLET BY MOUTH  DAILY Minus Breeding, MD Taking Active   aspirin EC 81 MG tablet [id number] Yes Take 81 mg by mouth daily. Swallow whole. [provider] Taking Active Self  betamethasone valerate ointment (VALISONE) 0.1 % [id number] Yes Apply qod to affected  area, small amount [name], MD Taking Active   Blood Glucose Monitoring Suppl [facility]) w/Device Drucie Opitz [id number] Yes Use to check blood sugars BID E11.69 [name], MD Taking Active   cholecalciferol (VITAMIN D) 1000 UNITS tablet [id number] Yes Take 1,000 Units by mouth daily. [provider] Taking Active Self  dicyclomine (BENTYL) 10 MG capsule [id number] Yes TAKE 1 CAPSULE BY MOUTH 4  TIMES DAILY AS NEEDED Gatha Mayer, MD Taking Active   furosemide (LASIX) 20 MG tablet [id number] Yes Take 1 tablet (20 mg total) by mouth daily as needed. [name], MD Taking Active   gabapentin (NEURONTIN) 100 MG capsule [id number] Yes 3 capsules daily, one in the morning, and two at night.  Patient taking differently: Take 200 mg by mouth at bedtime.   Pieter Partridge, DO Taking Active   hydrALAZINE (APRESOLINE) 10 MG tablet [id number] Yes Take 10 mg by mouth as needed. [provider] Taking Active   influenza vaccine adjuvanted (FLUAD) 0.5 ML injection [id number] Yes Inject into the muscle. Carlyle Basques, MD Taking Active   Lancets [facility] ULTRASOFT) lancets [id number] Yes USE AS DIRECTED 3 TIMES  DAILY [name], MD Taking Active   losartan (COZAAR) 50 MG tablet [id number] Yes TAKE 1 TABLET BY MOUTH  TWICE DAILY [name], MD Taking Active   metFORMIN (GLUCOPHAGE-XR) 500 MG 24 hr tablet [id number] Yes TAKE 1 TABLET BY MOUTH IN  THE MORNING AND AT BEDTIME  Patient taking differently: Take 500 mg by mouth daily with breakfast.   [name], MD Taking Active   metoCLOPramide (REGLAN) 5 MG tablet [id number] Yes TAKE 1 TABLET BY MOUTH  TWICE DAILY  Patient taking differently: Take 5 mg by mouth 2 (two) times daily as needed.   Gatha Mayer, MD Taking Active   metoprolol tartrate (LOPRESSOR) 50 MG tablet [id number] Yes TAKE 1 AND 1/2 TABLETS BY  MOUTH TWICE DAILY Minus Breeding, MD Taking Active   NEXIUM 40 MG capsule [id number] Yes Take 1 capsule (40 mg total) by mouth daily before breakfast. Gatha Mayer, MD Taking Active   ondansetron [facility]) 4 MG tablet [id number] Yes Take 4 mg by mouth every 4 (four) hours as needed. For nausea Renato Shin, MD Taking Active Self  [facility] ULTRA test strip [id number] Yes CHECK BLOOD SUGAR 3 TIMES  DAILY OR AS NEEDED. [name], MD Taking Active   potassium chloride (KLOR-CON) 10 MEQ tablet [id number] Yes TAKE 1 TABLET BY MOUTH  DAILY [name], MD Taking Active   rosuvastatin (CRESTOR) 20 MG tablet [id number] Yes TAKE 1 TABLET BY MOUTH  DAILY [name], MD Taking Active   thiamine (VITAMIN B-1) 100 MG tablet [id number] Yes Take 100 mg by mouth every other day. 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   Renal insufficiency 06/18/2019   Educated about COVID-19 virus infection 05/15/2019   Atrophic vaginitis 01/20/2019   Stenosis of carotid artery 11/12/2018   Anxiety 10/13/2018   Thrush 12/07/2017   Sinusitis 10/11/2017   Headache 07/12/2017   Dizzy  spells 11/21/2016   Constipation 11/21/2016   Nonrheumatic aortic valve stenosis 05/23/2016  Aortic atherosclerosis (HCC) 05/23/2016   Hematuria 03/16/2016   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   Pain of upper abdomen 06/06/2015   Neck pain 04/22/2015   Ear pain 02/28/2015   Abnormal urine 11/21/2014   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   Anemia 06/08/2014   RLS (restless legs syndrome) 11/23/2013   Diabetic peripheral neuropathy ([place]) 10/29/2013   Left-sided thoracic back pain 10/06/2013   Encounter for preventative adult health care exam with abnormal findings 09/14/2013   Iron deficiency anemia    Status post laparoscopy 02/25/2013   Hyponatremia 01/09/2013   GERD (gastroesophageal reflux disease) 01/09/2013   Amaurosis fugax of left eye 10/16/2012   Low back pain 06/03/2012   Vitamin D deficiency 03/11/2012   Bilateral hand pain 10/20/2011   Encounter for long-term (current) use of other medications 10/20/2011   IBS (irritable bowel syndrome) 08/14/2011   TIA (transient ischemic attack) 02/10/2011   Abnormal brain CT 01/19/2011   Allergic rhinitis 10/01/2010   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   FUNDIC GLAND POLYPS OF STOMACH 03/18/2010   Abdominal pain in female 03/18/2010   Pain in joint 76/73/4193   SYSTOLIC MURMUR 79/03/4095   Paroxysmal supraventricular tachycardia ([place]) 01/12/2009   PLANTAR FASCIITIS 06/08/2008   CHEST PAIN 05/18/2008   Gastroparesis 12/18/2007   HYPERCHOLESTEROLEMIA 06/11/2007   Diabetes mellitus type 2 in obese ([place]) 09/05/2006   Essential hypertension 09/05/2006    Immunization History  Administered Date(s) Administered   Fluad Quad(high Dose 65+) 11/18/2019, 12/01/2020   Influenza Split 10/31/2010, 11/28/2011   Influenza Whole 11/19/2007, 11/04/2008, 11/10/2009   Influenza, High Dose Seasonal PF 11/11/2015, 11/21/2016   Influenza,inj,Quad PF,6+ Mos 01/09/2013, 10/20/2013, 11/13/2014, 11/06/2018   PFIZER(Purple  Top)SARS-COV-2 Vaccination 03/14/2019, 04/09/2019, 12/29/2019   Pneumococcal Conjugate-13 09/11/2013   Pneumococcal Polysaccharide-23 03/13/2007   Td 03/02/2005    Conditions to be addressed/monitored: HTN, HLD, DMII, and gastroparesis; IBS; GERD; iron malabsorption / anemia; history of low thiamin; neuropathy / trigeminal neuralgia; h/o TIA; carotid artery stenosis;   Care Plan : General Pharmacy (Adult)  Updates made by [name], PHARMD since 12/03/2020 12:00 AM     Problem: HTN, type 2 DM; h/o TIA; hyperlipidemia; aorotic valve stenosis; paroxysmal SVT; hyponatremia; gastroparesis; IBS; GERD; iron malabsortion.   Priority: High  Onset Date: 11/16/2020     Long-Range Goal: Provide education, support and care coordination for medication therapy and chronic conditions   Start Date: 11/16/2020  Priority: High  Note:   Current Barriers:  Unable to independently monitor therapeutic efficacy Unable to achieve control of hyperlipidemia  Unable to maintain control of type 2 diabetes and hypertension Has not made recommended medication changes following last appointment / labs with primary care physician  Pharmacist Clinical Goal(s):  Over the next 90 days, patient will achieve adherence to monitoring guidelines and medication adherence to achieve therapeutic efficacy achieve control of hyperlipidmeia  as evidenced by LDL <70 maintain control of type 2 diabetes and hypertension  as evidenced by A1c <7.0, FBG 80 to 130 and post prandial blood glucose <180 and blood pressure <140/90  adhere to prescribed medication regimen as evidenced by refill history and self reporting  through collaboration with PharmD and provider.   Interventions: 1:1 collaboration with [name], MD regarding development and update of comprehensive plan of care as evidenced by provider attestation and co-signature Inter-disciplinary care team collaboration (see longitudinal plan of care) Comprehensive  medication review performed; medication list updated in electronic medical record  Diabetes: A1c is  at goal of <7.0% however patient report home blood glucose has not been at goal Last A1c was 6.3% (11/01/2020) Current treatment: Metformin ER 589m - patient lab notes states she should decreased dose to take just 1 tablet a day however patient has not lower dose yet, still taking 1 tablet twice a day Current glucose readings: fasting glucose: 75 to 130, post prandial glucose: 150 to over 200.  Patient reports she sometimes has lows and feels shaky and also has had some blood glucose over 200 after meals Reports hypoglycemic/hyperglycemic symptoms - reports episodes of feeling shaky.  Has been seen by endocrinologist in past (Dr ELoanne Drilling when her husband was still alive. He was type 2 diabetic and required insulin therapy. Current meal patterns: limits salt intake and fast food. Also limiting intake of sugar containing foods. She does eat an egg every day.  Interventions:  Patient encouraged to lower dose of metformin ER 5071mto take 1 tablets with breakfast Recommended she have a small snack prior to bedtime - provided handout with snack ideas. Placed Continuous Glucose Monitor today - Freestyle Libre 3 samples to review blood glucose trends. Set up patient's phone to track blood glucose. Reviewed how to use app and what trending arrows indicate.  Education provided about recognition and treatment of hypoglycemia If you have a low blood sugar less than 70, please eat 15 grams of carbohydrates (4 oz of juice, soda, 4 glucose tablets, or 3-4 pieces of hard candy). Wait 15 minutes and then recheck your blood sugar. If your sugar is still less than 70, eat another 15 grams of carbohydrates. Wait another 15 minutes and recheck your glucose. Continue this until your sugar is over 70. Then, eat a snack like crackers with peanut butter, apple with peanut butter or yogurt to prevent your sugar from dropping  again. Follow up planned in 2 weeks to discuss results.  Reminded to get yearly eye exam Reviewed home blood glucose readings and reviewed goals  Fasting blood glucose goal (before meals) = 80 to 130 Blood glucose goal after a meal = less than 180   Hypertension / Paroxysmal SVT: Controlled per last office visits but patient states blood pressure at home has been elevated Home blood pressure systolic 11325 15498'Ynd diastolic 60 - 70 Blood pressure goal < 140/90  BP Readings from Last 3 Encounters:  11/23/20 130/70  11/01/20 110/66  08/19/20 122/66  Current treatment: Metoprolol 5042mwice a day Amlodipine 5mg63mily in evening Hydralazine 10mg12mneeded for elevated blood pressure (has not taken recently)  Losartan 50mg 15me a day  Denies hypotensive/hypertensive symptoms Interventions:  Reviewed blood pressure goals Continue to check blood pressure at home once a day Reminded patient to make sure she has sat / rested for at least 5 mintues before checking blood pressure at home.  Continue current blood pressure medications  Hyperlipidemia / history of TIA / stenosis of carotid artery: Uncontrolled LDL goal < 70 LDL was 84 (11/01/2020) Current treatment:  Rosuvastatin 20mg d8m Aspirin 81mg da81m Interventions:  Reviewed refill history - filled 90 days supply of rosuvastatin 05/05/2020, 07/29/2020 and 10/19/2020.  Recommended limit intake of saturated and trans fat.  If LDL still > 70 at next lipid check, consider addition of ezetimibe 10mg dai47mGastroparesis / Irritable Bowel Syndrome / Acid reflux:  Managed by Dr Gessner NCarlean Purlowing any specific diet related to gastroparesis.  Current treatment:  Ondansetron 4mg - tak109m tablet every 4 hour as needed  for nausea Dicyclomine 39m up to 4 times a day as needed (taking less than daily)  Esomeprazole 447mdaily before breakfast (not taking regularly) Metoclopramide 17m74mwice a day Interventions:  Eat small frequent  meals due to gastroparesis Can take esomeprazole 20 or 9m69mer the counter for acid reflux Also can take pepcid / famotidine 20mg78mnight if needed for acid reflux Continue to follow up with Dr GessnCarlean Purlropathy / trigeminal neuralgia:  Patient states she only had facial pain related to trigeminal neuralgia once Initially tried duloxetine 08/11/2020 Reports dizziness and drowsiness with gabapentin Current treatment:  Gabapentin 100mg 51ms tapered to current dose of 100mg a23mdtime Patient report she would like to get off of gabapentin because she is unsure that she needs it. Currently taking only 200mg da28mat night Plan is to decreased to 100mg aft94m month, then stop - patient would like to stop sooner Interventions:  Recommended she stop gabapentin Notify office if any increase in facial / nerve pain.   Anemia / Low B1 (thiamin):  Managed by hematology but patient has not been in awhile due to past cost of IV iron infusions  Last CBC Lab Results  Component Value Date   WBC 8.8 11/01/2020   HGB 12.6 11/01/2020   HCT 37.7 11/01/2020   MCV 87.4 11/01/2020   MCH 30.0 06/16/2020   RDW 12.1 11/01/2020   PLT 217.0 11/01/2020  Last B1 was elevated at 77 (11/01/2020) Patient reports she was only taking thiamin / B1 100mg ever817mher day at time of last lab Dr Blyth recoCharlett Blakeed cut dose of thiamin in half.  Interventions:   Recommend take thiamin 50mg every80mer day - provided pharmacies that might have  50mg streng69mRecheck thiamine level in 2 months   Health Maintenance:  Reviewed vaccination history and discussed benefits of annual flu, COVID booster and Singrix vaccinations Patient to get flu vaccine today Plan to get COVID booster in 2 to 3 weeks (she did not want to get flu and booster together)  Plan to get Shingrix series in 2023.   Patient Goals/Self-Care Activities Over the next 90 days, patient will:  take medications as prescribed,  check glucose daily ,  document, and provide at future appointments,  check blood pressure daily, document, and provide at future appointments lower dose for thiamin to 50mg every O43m day lower dose of metformin to 500mg daily St19mgabapentin - report any increase in pain  Follow Up Plan: Telephone follow up appointment with care management team member scheduled for:  2 weeks           Medication Assistance: None required.  Patient affirms current coverage meets needs.  Patient's preferred pharmacy is:  [facility] SProducer, television/film/videoDelMeridiankeHolidaytCincinnati [facility] Medical Centere[phone]  [phone] HeightsecBarton Creekn[address] [id number] [phone]  [phone] [phone]  [phone] Hills Surgical Center LLCumRx [facility]) - [phone]  [phone] Patient agrees to Care Plan and Follow-up.  Plan: Telephone follow up appointment with care management team member scheduled for:  2 weeks  [name], PharmD Clinical Pharmacist [facility]

## 2020-12-03 NOTE — Chronic Care Management (AMB) (Signed)
Chronic Care Management Pharmacy Note  12/03/2020 Name:  [name] MRN:  [id number] DOB:  05/03/1940  Subjective: [name] is an 80 y.o. year old female who is a primary patient of [name], MD.  The CCM team was consulted for assistance with disease management and care coordination needs.    Engaged with patient by telephone for follow up visit in response to provider referral for pharmacy case management and/or care coordination services.   Consent to Services:  The patient was given information about Chronic Care Management services, agreed to services, and gave verbal consent prior to initiation of services.  Please see initial visit note for detailed documentation.   Patient Care Team: [name], MD as PCP - General (Family Medicine) Minus Breeding, MD as PCP - Cardiology (Cardiology) Gatha Mayer, MD as Consulting Physician (Gastroenterology) Love, Alyson Locket, MD (Neurology) Pieter Partridge, DO as Consulting Physician (Neurology) [name], RN as Case Manager [name], PharmD (Pharmacist)  Recent office visits:  11/01/20-Dr Charlett Blake (PCP) General follow up visit. Labs ordered. Patient states Gabapentin has not helped and she has had enough side effects that she is preparing to stop it so she will drop from 300 to 200 mg daily x 1 month then 1 tab daily x 1 month then stop. Drop Metformin to 1 tab daily. Follow up in 3 months. 08/19/20-Dr Charlett Blake (PCP) Seen for general follow up visit. Follow up in 7 weeks. 08/11/20- Dr Nani Ravens (PCP office)  Seen for sore throat. OK to take Tylenol 1000 mg (2 extra strength tabs) or 975 mg (3 regular strength tabs) every 6 hours as needed. Consider throat lozenges, salt water gargles and an air humidifier for symptomatic care.  07/06/20-Dr Charlett Blake (PCP) Seen for inflammation and numbness on the left side of her head. Labs ordered. Follow up in 6 weeks. 06/22/20-Dr Charlett Blake (PCP) Seen for Numbness. Ambulatory  referral to Neurology. Labs ordered..   Recent consult visits:  08/03/20-(Neurology) Stephan Minister. Tomi Likens, DO  Consult visit for headache. Start gabapentin - increase dose as instructed. MRI ordered. Follow up in 6 months. 06/16/20- Hematology [facility]) Anemia follow up visit.  05/26/20-(Gastroenterology) Gatha Mayer, MD  Seen for abdominal pain. Patient was given a testing kit to check for small intestine bacterial overgrowth.   Hospital visits:   08/03/20 ED visit at [facility] due to Chronic left shoulder pain.   New?Medications Started at [facility] Discharge:?? Oxycodone 5 mg every 8 hours PRN Tylenol 325 mg every 6 hours PRN   Medication Changes at Hospital Discharge: None noted   Medications Discontinued at Hospital Discharge: None noted  Objective:  Lab Results  Component Value Date   CREATININE 0.80 11/01/2020   CREATININE 0.75 07/29/2020   CREATININE 0.83 07/13/2020    Lab Results  Component Value Date   HGBA1C 6.3 11/01/2020   Last diabetic Eye exam:  Lab Results  Component Value Date/Time   HMDIABEYEEXA No Retinopathy 02/28/2019 12:00 AM    Last diabetic Foot exam: No results found for: HMDIABFOOTEX      Component Value Date/Time   CHOL 190 11/01/2020 1441   TRIG 200.0 (H) 11/01/2020 1441   TRIG 94 11/16/2005 0905   HDL 65.50 11/01/2020 1441   CHOLHDL 3 11/01/2020 1441   VLDL 40.0 11/01/2020 1441   LDLCALC 84 11/01/2020 1441   LDLCALC 116 (H) 11/13/2019 0906   LDLDIRECT 95.0 03/25/2020 1149    Hepatic Function Latest Ref Rng &  Units 11/01/2020 07/29/2020 07/13/2020  Total Protein 6.0 - 8.3 g/dL 7.3 7.0 6.9  Albumin 3.5 - 5.2 g/dL 4.7 4.6 4.5  AST 0 - 37 U/L _0 ALT 0 - 35 U/L 20 29 35  Alk Phosphatase 39 - 117 U/L 71 59 46  Total Bilirubin 0.2 - 1.2 mg/dL 0.6 0.8 1.0  Bilirubin, Direct 0.0 - 0.3 mg/dL - - -    Lab Results  Component Value Date/Time   TSH 2.16 11/01/2020 02:41 PM   TSH 1.71 06/28/2020 09:15 AM   FREET4  0.97 08/08/2019 11:57 AM    CBC Latest Ref Rng & Units 11/01/2020 07/13/2020 07/06/2020  WBC 4.0 - 10.5 K/uL 8.8 12.1(H) 16.4(H)  Hemoglobin 12.0 - 15.0 g/dL 12.6 13.1 13.4  Hematocrit 36.0 - 46.0 % 37.7 37.8 39.0  Platelets 150.0 - 400.0 K/uL 217.0 197.0 259.0    Lab Results  Component Value Date/Time   VD25OH 49.54 11/01/2020 02:41 PM   VD25OH 48.29 06/28/2020 10:41 AM    Clinical ASCVD: Yes  The 10-year ASCVD risk score (Arnett DK, et al., 2019) is: 53.7%   Values used to calculate the score:     Age: 79 years     Sex: Female     Is Non-Hispanic African American: No     Diabetic: Yes     Tobacco smoker: No     Systolic Blood Pressure: 384 mmHg     Is BP treated: Yes     HDL Cholesterol: 65.5 mg/dL     Total Cholesterol: 190 mg/dL    Other:   DEXA 04/26/2020: Femur Neck Right  T-score +0.6. L3 was excluded due to degenerative changes  AP Spine L1-L4 (L3)  Normal +1.9  DualFemur Neck Right Normal +0.6   Social History   Tobacco Use  Smoking Status Never  Smokeless Tobacco Never  Tobacco Comments   Never used tobacco   BP Readings from Last 3 Encounters:  11/23/20 130/70  11/01/20 110/66  08/19/20 122/66   Pulse Readings from Last 3 Encounters:  11/23/20 60  11/01/20 64  08/19/20 69   Wt Readings from Last 3 Encounters:  11/23/20 166 lb 12.8 oz (75.7 kg)  11/01/20 166 lb 3.2 oz (75.4 kg)  08/19/20 166 lb 6.4 oz (75.5 kg)    Assessment: Review of patient past medical history, allergies, medications, health status, including review of consultants reports, laboratory and other test data, was performed as part of comprehensive evaluation and provision of chronic care management services.   SDOH:  (Social Determinants of Health) assessments and interventions performed:     CCM Care Plan  Allergies  Allergen Reactions   Tramadol Other (See Comments)    Dizziness     Medications Reviewed Today     Reviewed by [name], PharmD (Pharmacist) on  12/03/20 at 1328  Med List Status: <None>   Medication Order Taking? Sig Documenting Provider Last Dose Status Informant  acetaminophen (TYLENOL) 325 MG tablet [id number] Yes Take 2 tablets (650 mg total) by mouth every 6 (six) hours as needed for up to 30 doses for mild pain or moderate pain. Wyvonnia Dusky, MD Taking Active   amLODipine (NORVASC) 5 MG tablet [id number] Yes TAKE 1 TABLET BY MOUTH  DAILY Minus Breeding, MD Taking Active   aspirin EC 81 MG tablet [id number] Yes Take 81 mg by mouth daily. Swallow whole. [provider] Taking Active Self  betamethasone valerate ointment (VALISONE) 0.1 % [id number] Yes Apply qod to affected  area, small amount [name], MD Taking Active   Blood Glucose Monitoring Suppl [facility]) w/Device Drucie Opitz [id number] Yes Use to check blood sugars BID E11.69 [name], MD Taking Active   cholecalciferol (VITAMIN D) 1000 UNITS tablet [id number] Yes Take 1,000 Units by mouth daily. [provider] Taking Active Self  dicyclomine (BENTYL) 10 MG capsule [id number] Yes TAKE 1 CAPSULE BY MOUTH 4  TIMES DAILY AS NEEDED Gatha Mayer, MD Taking Active   furosemide (LASIX) 20 MG tablet [id number] Yes Take 1 tablet (20 mg total) by mouth daily as needed. [name], MD Taking Active   gabapentin (NEURONTIN) 100 MG capsule [id number] Yes 3 capsules daily, one in the morning, and two at night.  Patient taking differently: Take 200 mg by mouth at bedtime.   Pieter Partridge, DO Taking Active   hydrALAZINE (APRESOLINE) 10 MG tablet [id number] Yes Take 10 mg by mouth as needed. [provider] Taking Active   influenza vaccine adjuvanted (FLUAD) 0.5 ML injection [id number] Yes Inject into the muscle. Carlyle Basques, MD Taking Active   Lancets [facility] ULTRASOFT) lancets [id number] Yes USE AS DIRECTED 3 TIMES  DAILY [name], MD Taking Active   losartan (COZAAR) 50 MG tablet [id number] Yes TAKE 1 TABLET BY MOUTH  TWICE DAILY [name], MD Taking Active   metFORMIN (GLUCOPHAGE-XR) 500 MG 24 hr tablet [id number] Yes TAKE 1 TABLET BY MOUTH IN  THE MORNING AND AT BEDTIME  Patient taking differently: Take 500 mg by mouth daily with breakfast.   [name], MD Taking Active   metoCLOPramide (REGLAN) 5 MG tablet [id number] Yes TAKE 1 TABLET BY MOUTH  TWICE DAILY  Patient taking differently: Take 5 mg by mouth 2 (two) times daily as needed.   Gatha Mayer, MD Taking Active   metoprolol tartrate (LOPRESSOR) 50 MG tablet [id number] Yes TAKE 1 AND 1/2 TABLETS BY  MOUTH TWICE DAILY Minus Breeding, MD Taking Active   NEXIUM 40 MG capsule [id number] Yes Take 1 capsule (40 mg total) by mouth daily before breakfast. Gatha Mayer, MD Taking Active   ondansetron [facility]) 4 MG tablet [id number] Yes Take 4 mg by mouth every 4 (four) hours as needed. For nausea Renato Shin, MD Taking Active Self  [facility] ULTRA test strip [id number] Yes CHECK BLOOD SUGAR 3 TIMES  DAILY OR AS NEEDED. [name], MD Taking Active   potassium chloride (KLOR-CON) 10 MEQ tablet [id number] Yes TAKE 1 TABLET BY MOUTH  DAILY [name], MD Taking Active   rosuvastatin (CRESTOR) 20 MG tablet [id number] Yes TAKE 1 TABLET BY MOUTH  DAILY [name], MD Taking Active   thiamine (VITAMIN B-1) 100 MG tablet [id number] Yes Take 100 mg by mouth every other day. 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   Renal insufficiency 06/18/2019   Educated about COVID-19 virus infection 05/15/2019   Atrophic vaginitis 01/20/2019   Stenosis of carotid artery 11/12/2018   Anxiety 10/13/2018   Thrush 12/07/2017   Sinusitis 10/11/2017   Headache 07/12/2017   Dizzy  spells 11/21/2016   Constipation 11/21/2016   Nonrheumatic aortic valve stenosis 05/23/2016  Aortic atherosclerosis (HCC) 05/23/2016   Hematuria 03/16/2016   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   Pain of upper abdomen 06/06/2015   Neck pain 04/22/2015   Ear pain 02/28/2015   Abnormal urine 11/21/2014   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   Anemia 06/08/2014   RLS (restless legs syndrome) 11/23/2013   Diabetic peripheral neuropathy ([place]) 10/29/2013   Left-sided thoracic back pain 10/06/2013   Encounter for preventative adult health care exam with abnormal findings 09/14/2013   Iron deficiency anemia    Status post laparoscopy 02/25/2013   Hyponatremia 01/09/2013   GERD (gastroesophageal reflux disease) 01/09/2013   Amaurosis fugax of left eye 10/16/2012   Low back pain 06/03/2012   Vitamin D deficiency 03/11/2012   Bilateral hand pain 10/20/2011   Encounter for long-term (current) use of other medications 10/20/2011   IBS (irritable bowel syndrome) 08/14/2011   TIA (transient ischemic attack) 02/10/2011   Abnormal brain CT 01/19/2011   Allergic rhinitis 10/01/2010   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   FUNDIC GLAND POLYPS OF STOMACH 03/18/2010   Abdominal pain in female 03/18/2010   Pain in joint 76/73/4193   SYSTOLIC MURMUR 79/03/4095   Paroxysmal supraventricular tachycardia ([place]) 01/12/2009   PLANTAR FASCIITIS 06/08/2008   CHEST PAIN 05/18/2008   Gastroparesis 12/18/2007   HYPERCHOLESTEROLEMIA 06/11/2007   Diabetes mellitus type 2 in obese ([place]) 09/05/2006   Essential hypertension 09/05/2006    Immunization History  Administered Date(s) Administered   Fluad Quad(high Dose 65+) 11/18/2019, 12/01/2020   Influenza Split 10/31/2010, 11/28/2011   Influenza Whole 11/19/2007, 11/04/2008, 11/10/2009   Influenza, High Dose Seasonal PF 11/11/2015, 11/21/2016   Influenza,inj,Quad PF,6+ Mos 01/09/2013, 10/20/2013, 11/13/2014, 11/06/2018   PFIZER(Purple  Top)SARS-COV-2 Vaccination 03/14/2019, 04/09/2019, 12/29/2019   Pneumococcal Conjugate-13 09/11/2013   Pneumococcal Polysaccharide-23 03/13/2007   Td 03/02/2005    Conditions to be addressed/monitored: HTN, HLD, DMII, and gastroparesis; IBS; GERD; iron malabsorption / anemia; history of low thiamin; neuropathy / trigeminal neuralgia; h/o TIA; carotid artery stenosis;   Care Plan : General Pharmacy (Adult)  Updates made by [name], PHARMD since 12/03/2020 12:00 AM     Problem: HTN, type 2 DM; h/o TIA; hyperlipidemia; aorotic valve stenosis; paroxysmal SVT; hyponatremia; gastroparesis; IBS; GERD; iron malabsortion.   Priority: High  Onset Date: 11/16/2020     Long-Range Goal: Provide education, support and care coordination for medication therapy and chronic conditions   Start Date: 11/16/2020  Priority: High  Note:   Current Barriers:  Unable to independently monitor therapeutic efficacy Unable to achieve control of hyperlipidemia  Unable to maintain control of type 2 diabetes and hypertension Has not made recommended medication changes following last appointment / labs with primary care physician  Pharmacist Clinical Goal(s):  Over the next 90 days, patient will achieve adherence to monitoring guidelines and medication adherence to achieve therapeutic efficacy achieve control of hyperlipidmeia  as evidenced by LDL <70 maintain control of type 2 diabetes and hypertension  as evidenced by A1c <7.0, FBG 80 to 130 and post prandial blood glucose <180 and blood pressure <140/90  adhere to prescribed medication regimen as evidenced by refill history and self reporting  through collaboration with PharmD and provider.   Interventions: 1:1 collaboration with [name], MD regarding development and update of comprehensive plan of care as evidenced by provider attestation and co-signature Inter-disciplinary care team collaboration (see longitudinal plan of care) Comprehensive  medication review performed; medication list updated in electronic medical record  Diabetes: A1c is  at goal of <7.0% however patient report home blood glucose has not been at goal Last A1c was 6.3% (11/01/2020) Current treatment: Metformin ER 500mg  - take 1 tablet each morning (since our last visti 12/01/2020 patient has only taken 1 tablet daily) Current glucose readings (from 12/01/2020 to 12/03/2020)                                          Patient did have a low last night around midnight. Blood glucose dropped to 58. She ate 2 pieces of bread, cheese and grapes.  She did not eat a snack prior to bedtime last night.  Has been seen by endocrinologist in past (Dr Loanne Drilling) when her husband was still alive. He was type 2 diabetic and required insulin therapy. Current meal patterns: limits salt intake and fast food. Also limiting intake of sugar containing foods. She does eat an egg every day.  Intervention Again recommended she have a small snack the hour prior to bedtime - reviewed handout of snack ideas provided in office 12/01/2020. I asked patient to try 1 day of not taking metformin to see what happens with her blood glucose. She will hold metformin tomorrow 12/04/20 and then restart the morning of 12/05/2020.  Assisted patient with troubleshooting with Continuous Glucose Monitor (she was on a report screen and not sure how to get back to home screen) Education provided about recognition and treatment of hypoglycemia If you have a low blood sugar less than 70, please eat 15 grams of carbohydrates (4 oz of juice, soda, 4 glucose tablets, or 3-4 pieces of hard candy. 1 teaspoon of honey or sugar).  Wait 15 minutes and then recheck your blood sugar. If your sugar is still less than 70, eat another 15 grams of carbohydrates.  Wait another 15 minutes and recheck your glucose. Continue this until your sugar is over 70.  Once blood glucose is >70 you should eat a snack like crackers with peanut  butter, apple with peanut butter or yogurt to prevent your sugar from dropping again. Follow up planned for next week Reminded to get yearly eye exam Reviewed home blood glucose readings and reviewed goals  Fasting blood glucose goal (before meals) = 80 to 130 Blood glucose goal after a meal = less than 180   Hypertension / Paroxysmal SVT: Controlled per last office visits but patient states blood pressure at home has been elevated Home blood pressure systolic 329 - 518'A and diastolic 60 - 70 Blood pressure goal < 140/90  BP Readings from Last 3 Encounters:  11/23/20 130/70  11/01/20 110/66  08/19/20 122/66  Current treatment: Metoprolol 50mg  twice a day Amlodipine 5mg  daily in evening Hydralazine 10mg  as needed for elevated blood pressure (has not taken recently)  Losartan 50mg  twice a day  Denies hypotensive/hypertensive symptoms Interventions:  Reviewed blood pressure goals Continue to check blood pressure at home once a day Reminded patient to make sure she has sat / rested for at least 5 mintues before checking blood pressure at home.  Continue current blood pressure medications  Hyperlipidemia / history of TIA / stenosis of carotid artery: Uncontrolled LDL goal < 70 LDL was 84 (11/01/2020) Current treatment:  Rosuvastatin 20mg  daily Aspirin 81mg  daily  Interventions:  Reviewed refill history - filled 90 days supply of rosuvastatin 05/05/2020, 07/29/2020 and 10/19/2020.  Recommended limit intake of saturated and trans fat.  If  LDL still > 70 at next lipid check, consider addition of ezetimibe 10mg  daily  Gastroparesis / Irritable Bowel Syndrome / Acid reflux:  Managed by Dr Carlean Purl Not following any specific diet related to gastroparesis.  Current treatment:  Ondansetron 4mg  - take 1 tablet every 4 hour as needed for nausea Dicyclomine 10mg  up to 4 times a day as needed (taking less than daily)  Esomeprazole 40mg  daily before breakfast (not taking  regularly) Metoclopramide 5mg  twice a day Interventions:  Eat small frequent meals due to gastroparesis Can take esomeprazole 20 or 40mg  over the counter for acid reflux Also can take pepcid / famotidine 20mg  at night if needed for acid reflux Continue to follow up with Dr Carlean Purl  Neuropathy / trigeminal neuralgia:  Patient states she only had facial pain related to trigeminal neuralgia once Initially tried duloxetine 08/11/2020 Reports dizziness and drowsiness with gabapentin Current treatment:  Gabapentin 100mg  - has tapered to current dose of 100mg  at bedtime Patient report she would like to get off of gabapentin because she is unsure that she needs it. Currently taking only 200mg  daily at night Plan is to decreased to 100mg  after 1 month, then stop - patient would like to stop sooner Interventions:  Recommended she stop gabapentin Notify office if any increase in facial / nerve pain.   Anemia / Low B1 (thiamin):  Managed by hematology but patient has not been in awhile due to past cost of IV iron infusions  Last CBC Lab Results  Component Value Date   WBC 8.8 11/01/2020   HGB 12.6 11/01/2020   HCT 37.7 11/01/2020   MCV 87.4 11/01/2020   MCH 30.0 06/16/2020   RDW 12.1 11/01/2020   PLT 217.0 11/01/2020  Last B1 was elevated at 77 (11/01/2020) Patient reports she was only taking thiamin / B1 100mg  every other day at time of last lab Dr Charlett Blake recommended cut dose of thiamin in half.  Interventions:   Recommend take thiamin 50mg  every other day - provided pharmacies that might have  50mg  strength  Recheck thiamine level in 2 months   Health Maintenance:  Reviewed vaccination history and discussed benefits of COVID booster and Singrix vaccinations Plan to get COVID booster in 2 to 3 weeks (she did not want to get flu and booster together)  Plan to get Shingrix series in 2023.   Patient Goals/Self-Care Activities Over the next 90 days, patient will:  take medications as  prescribed,  check glucose daily , document, and provide at future appointments,  check blood pressure daily, document, and provide at future appointments lower dose for thiamin to 50mg  every OTHER day lower dose of metformin to 500mg  daily Stop  gabapentin - report any increase in pain  Follow Up Plan: Telephone follow up appointment with care management team member scheduled for:  2 weeks           Medication Assistance: None required.  Patient affirms current coverage meets needs.  Patient's preferred pharmacy is:  [employer]  ([place], [place] [facility] [address] [place] [address] [place] [address] Phone: [phone] Fax: [phone]  [place], [place] [address] [place] [address] Phone: [phone] Fax: [phone]  [place] [address] [place] [address] Phone: [phone] Fax: [phone]  [facility] ([facility]) - [place], [place]  [place] [address] [place] [address] Phone: [phone] Fax: [phone]   Follow Up:  Patient agrees to Care Plan and Follow-up.  Plan: Telephone follow up appointment with care management team member scheduled for:  1 week  [name], PharmD Clinical Pharmacist [facility] [place] [facility]

## 2020-12-03 NOTE — Patient Instructions (Signed)
Visit Information  PATIENT GOALS:  Goals Addressed             This [place] for Chronic Care Management        Pharmacist Clinical Goal(s):  Over the next 90 days, patient will achieve adherence to monitoring guidelines and medication adherence to achieve therapeutic efficacy achieve control of hyperlipidmeia  as evidenced by LDL <70 maintain control of type 2 diabetes and hypertension  as evidenced by A1c <7.0, FBG 80 to 130 and post prandial blood glucose <180 and blood pressure <140/90  adhere to prescribed medication regimen as evidenced by refill history and self reporting through collaboration with PharmD and provider.   Interventions: 1:1 collaboration with [name], MD regarding development and update of comprehensive plan of care as evidenced by provider attestation and co-signature Inter-disciplinary care team collaboration (see longitudinal plan of care) Comprehensive medication review performed; medication list updated in electronic medical record  Diabetes: A1c is at goal of <7.0% however patient reports home blood glucose has not been at goal Last A1c was 6.3% (11/01/2020) Current treatment: Metformin ER 500mg  - take 2 tablets daily  Interventions:  Hold metformin ER to 500mg  for 1 day, then restart 500mg  once each morning with breakfast Eat a small snack prior to bedtime to prevent low blood glucose during sleep Education provided about recognition and treatment of hypoglycemiz If you have a low blood sugar less than 70, please eat 15 grams of carbohydrates (4 oz of juice, soda, 4 glucose tablets, or 3-4 pieces of hard candy). Wait 15 minutes and then recheck your blood sugar. If your sugar is still less than 70, eat another 15 grams of carbohydrates. Wait another 15 minutes and recheck your glucose. Continue this until your sugar is over 70. Then, eat a snack like crackers with peanut butter, apply with peanut butter or yogurt to  prevent your sugar from dropping again. We have place a Continuous Glucose Monitor today - please call if you have any questions Call Dr Digby's office to make appointment for your yearly eye exam Reviewed home blood glucose readings and reviewed goals  Fasting blood glucose goal (before meals) = 80 to 130 Blood glucose goal after a meal = less than 180   Hypertension / Paroxysmal SVT: Controlled per last office visits but patient states blood pressure at home has been elevated Blood pressure goal < 140/90  BP Readings from Last 3 Encounters:  11/01/20 110/66  08/19/20 122/66  08/11/20 120/70  Current treatment: Metoprolol 50mg  twice a day Amlodipine 5mg  daily in evening Hydralazine 10mg  as needed for elevated blood pressure (has not taken recently)  Losartan 50mg  twice a day  Interventions:  Reviewed blood pressure goals Continue to check blood pressure at home once a day Reminded patient to make sure she has sat / rested for at least 5 mintues before checking blood pressure at home.  Continue current blood pressure medications  Hyperlipidemia / history of TIA / stenosis of carotid artery: Uncontrolled LDL goal < 70 LDL was 84 (11/01/2020) Current treatment:  Rosuvastatin 20mg  daily Aspirin 81mg  daily  Interventions:  Reviewed refill history - filled 90 days supply of rosuvastatin 05/05/2020, 07/29/2020 and 10/19/2020.  Recommended limit intake of saturated and trans fat.  If LDL still > 70 at next lipid check, consider addition of ezetimibe 10mg  daily   Gastroparesis / Irritable Bowel Syndrome / Acid reflux:  Goal: decrease acid reflux symptoms and improve abdominal discomfort Current  treatment:  Ondansetron 4mg  - take 1 tablet every 4 hour as needed for nausea Dicyclomine 10mg  up to 4 times a day as needed (taking less than daily)  Esomeprazole 40mg  daily before breakfast (not taking regularly) Metoclopramide 5mg  twice a day Interventions:  Eat small frequent meals  due to gastroparesis Can take esomeprazole 20 or 40mg  over the counter for acid reflux Also can take pepcid / famotidine 20mg  at night if needed for acid reflux Continue to follow up with Dr Carlean Purl  Neuropathy / trigeminal neuralgia:  Goal; decrease nerve / facial pain while minimizing side effects Current treatment:  Gabapentin 100mg  - take 3 capsules daily - 1 each morning and 2 each night at bedtime Patient report she would like to get off of gabapentin because she is unsure that she needs it. Currently taking only 200mg  daily at night Plan is to decreased to 100mg  after 1 month, then stop - patient would like to stop sooner Interventions:  Discussed quicker taper. Can take gabapentin 200mg  at bedtime for 1 more week, then decrease to 100mg  for 1 week, then stop Notify office if any increase in facial / nerve pain.   Anemia / Low B1 (thiamin):   Last CBC Lab Results  Component Value Date   WBC 8.8 11/01/2020   HGB 12.6 11/01/2020   HCT 37.7 11/01/2020   MCV 87.4 11/01/2020   MCH 30.0 06/16/2020   RDW 12.1 11/01/2020   PLT 217.0 11/01/2020  Last B1 was elevated at 77 (11/01/2020) Patient reports she was only taking thiamin / B1 100mg  every other day at time of last lab Dr Charlett Blake recommended cut dose of thiamin in half.  Interventions:   Recommend take thiamin 50mg  every other day  Recheck thiamine level in 2 months   Health Maintenance:  Reviewed vaccination history and discussed benefits of COVID booster and Singrix vaccinations Plan to get COVID booster in 2 to 3 weeks (she did not want to get flu and booster together)  Plan to get Shingrix series in 2023.   Patient Goals/Self-Care Activities Over the next 90 days, patient will:  take medications as prescribed,  check glucose daily , document, and provide at future appointments,  check blood pressure daily, document, and provide at future appointments lower dose for thiamin to 50mg  every OTHER day lower dose of  metformin to 500mg  daily Stop gabapentin  Follow Up Plan: Telephone follow up appointment with care management team member scheduled for:  2 weeks           The patient verbalized understanding of instructions, educational materials, and care plan provided today and declined offer to receive copy of patient instructions, educational materials, and care plan.   Telephone follow up appointment with care management team member scheduled for: 1 week  [name], PharmD Clinical Pharmacist [facility]

## 2020-12-06 DIAGNOSIS — E669 Obesity, unspecified: Secondary | ICD-10-CM

## 2020-12-06 DIAGNOSIS — G459 Transient cerebral ischemic attack, unspecified: Secondary | ICD-10-CM

## 2020-12-06 DIAGNOSIS — E7800 Pure hypercholesterolemia, unspecified: Secondary | ICD-10-CM

## 2020-12-06 DIAGNOSIS — E1169 Type 2 diabetes mellitus with other specified complication: Secondary | ICD-10-CM | POA: Diagnosis not present

## 2020-12-06 DIAGNOSIS — I10 Essential (primary) hypertension: Secondary | ICD-10-CM | POA: Diagnosis not present

## 2020-12-06 DIAGNOSIS — D500 Iron deficiency anemia secondary to blood loss (chronic): Secondary | ICD-10-CM

## 2020-12-07 ENCOUNTER — Other Ambulatory Visit: Payer: Medicare Other

## 2020-12-07 ENCOUNTER — Ambulatory Visit: Payer: Medicare Other | Admitting: Family

## 2020-12-09 ENCOUNTER — Telehealth: Payer: Medicare Other

## 2020-12-13 ENCOUNTER — Ambulatory Visit (INDEPENDENT_AMBULATORY_CARE_PROVIDER_SITE_OTHER): Payer: Medicare Other

## 2020-12-13 DIAGNOSIS — I10 Essential (primary) hypertension: Secondary | ICD-10-CM

## 2020-12-13 DIAGNOSIS — E1169 Type 2 diabetes mellitus with other specified complication: Secondary | ICD-10-CM

## 2020-12-13 DIAGNOSIS — K581 Irritable bowel syndrome with constipation: Secondary | ICD-10-CM

## 2020-12-13 NOTE — Chronic Care Management (AMB) (Signed)
Chronic Care Management   CCM RN Visit Note  12/13/2020 Name: [name] MRN: [id number] DOB: [birth year]  Subjective: [name] is a 80 y.o. year old female who is a primary care patient of [name], MD. The care management team was consulted for assistance with disease management and care coordination needs.    Engaged with patient by telephone for follow up visit in response to provider referral for case management and/or care coordination services.   Consent to Services:  The patient was given information about Chronic Care Management services, agreed to services, and gave verbal consent prior to initiation of services.  Please see initial visit note for detailed documentation.   Patient agreed to services and verbal consent obtained.   Assessment: Review of patient past medical history, allergies, medications, health status, including review of consultants reports, laboratory and other test data, was performed as part of comprehensive evaluation and provision of chronic care management services.   SDOH (Social Determinants of Health) assessments and interventions performed:    CCM Care Plan  Allergies  Allergen Reactions   Tramadol Other (See Comments)    Dizziness     Outpatient Encounter Medications as of 12/13/2020  Medication Sig   acetaminophen (TYLENOL) 325 MG tablet Take 2 tablets (650 mg total) by mouth every 6 (six) hours as needed for up to 30 doses for mild pain or moderate pain.   amLODipine (NORVASC) 5 MG tablet TAKE 1 TABLET BY MOUTH  DAILY   aspirin EC 81 MG tablet Take 81 mg by mouth daily. Swallow whole.   betamethasone valerate ointment (VALISONE) 0.1 % Apply qod to affected area, small amount   Blood Glucose Monitoring Suppl (ONETOUCH ULTRALINK) w/Device KIT Use to check blood sugars BID E11.69   cholecalciferol (VITAMIN D) 1000 UNITS tablet Take 1,000 Units by mouth daily.   dicyclomine (BENTYL) 10 MG capsule TAKE 1 CAPSULE BY  MOUTH 4  TIMES DAILY AS NEEDED   furosemide (LASIX) 20 MG tablet Take 1 tablet (20 mg total) by mouth daily as needed.   gabapentin (NEURONTIN) 100 MG capsule 3 capsules daily, one in the morning, and two at night. (Patient not taking: Reported on 12/13/2020)   hydrALAZINE (APRESOLINE) 10 MG tablet Take 10 mg by mouth as needed.   influenza vaccine adjuvanted (FLUAD) 0.5 ML injection Inject into the muscle.   Lancets (ONETOUCH ULTRASOFT) lancets USE AS DIRECTED 3 TIMES  DAILY   losartan (COZAAR) 50 MG tablet TAKE 1 TABLET BY MOUTH  TWICE DAILY   metFORMIN (GLUCOPHAGE-XR) 500 MG 24 hr tablet TAKE 1 TABLET BY MOUTH IN  THE MORNING AND AT BEDTIME (Patient taking differently: Take 500 mg by mouth daily with breakfast.)   metoCLOPramide (REGLAN) 5 MG tablet TAKE 1 TABLET BY MOUTH  TWICE DAILY (Patient taking differently: Take 5 mg by mouth 2 (two) times daily as needed.)   metoprolol tartrate (LOPRESSOR) 50 MG tablet TAKE 1 AND 1/2 TABLETS BY  MOUTH TWICE DAILY   NEXIUM 40 MG capsule Take 1 capsule (40 mg total) by mouth daily before breakfast.   ondansetron (ZOFRAN) 4 MG tablet Take 4 mg by mouth every 4 (four) hours as needed. For nausea   ONETOUCH ULTRA test strip CHECK BLOOD SUGAR 3 TIMES  DAILY OR AS NEEDED.   potassium chloride (KLOR-CON) 10 MEQ tablet TAKE 1 TABLET BY MOUTH  DAILY   rosuvastatin (CRESTOR) 20 MG tablet TAKE 1 TABLET BY MOUTH  DAILY   thiamine (VITAMIN B-1) 100  MG tablet Take 100 mg by mouth every other day.   No facility-administered encounter medications on file as of 12/13/2020.    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   Renal insufficiency 06/18/2019   Educated about COVID-19 virus infection 05/15/2019   Atrophic vaginitis  01/20/2019   Stenosis of carotid artery 11/12/2018   Anxiety 10/13/2018   Thrush 12/07/2017   Sinusitis 10/11/2017   Headache 07/12/2017   Dizzy spells 11/21/2016   Constipation 11/21/2016   Nonrheumatic aortic valve stenosis 05/23/2016   Aortic atherosclerosis (HCC) 05/23/2016   Hematuria 03/16/2016   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   Pain of upper abdomen 06/06/2015   Neck pain 04/22/2015   Ear pain 02/28/2015   Abnormal urine 11/21/2014   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   Anemia 06/08/2014   RLS (restless legs syndrome) 11/23/2013   Diabetic peripheral neuropathy ([place]) 10/29/2013   Left-sided thoracic back pain 10/06/2013   Encounter for preventative adult health care exam with abnormal findings 09/14/2013   Iron deficiency anemia    Status post laparoscopy 02/25/2013   Hyponatremia 01/09/2013   GERD (gastroesophageal reflux disease) 01/09/2013   Amaurosis fugax of left eye 10/16/2012   Low back pain 06/03/2012   Vitamin D deficiency 03/11/2012   Bilateral hand pain 10/20/2011   Encounter for long-term (current) use of other medications 10/20/2011   IBS (irritable bowel syndrome) 08/14/2011   TIA (transient ischemic attack) 02/10/2011   Abnormal brain CT 01/19/2011   Allergic rhinitis 10/01/2010   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   FUNDIC GLAND POLYPS OF STOMACH 03/18/2010   Abdominal pain in female 03/18/2010   Pain in joint 82/95/6213   SYSTOLIC MURMUR 08/65/7846   Paroxysmal supraventricular tachycardia ([place]) 01/12/2009   PLANTAR FASCIITIS 06/08/2008   CHEST PAIN 05/18/2008   Gastroparesis 12/18/2007   HYPERCHOLESTEROLEMIA 06/11/2007   Diabetes mellitus type 2 in obese ([place]) 09/05/2006   Essential hypertension 09/05/2006    Conditions to be addressed/monitored:HTN, HLD, DMII, and IBS  Care Plan : Diabetes Type 2 (Adult)  Updates made by [name], RN since 12/13/2020 12:00 AM     Problem: knowledge deficit related to  long term care plan for managment of diabetes in a patient with chronic disease: HTN, hypercholesterolemia; gastroparesis; IBS, stomach tumor, anemia,trigeminal neuralgia   Priority: Medium     Long-Range Goal: Disease Progression Prevented or Minimized   Start Date: 11/09/2020  Expected End Date: 05/10/2021  This Visit's Progress: On track  Priority: Medium  Note:   Objective:  Lab Results  Component Value Date   HGBA1C 6.3 11/01/2020   Lab Results  Component Value Date   CREATININE 0.80 11/01/2020   CREATININE 0.75 07/29/2020   CREATININE 0.83 07/13/2020   Lab Results  Component Value Date   EGFR 75 (L) 07/08/2015  Objective:  Last practice recorded BP readings:  BP Readings from Last 3 Encounters:  11/23/20 130/70  11/01/20 110/66  08/19/20 122/66   Most recent eGFR/CrCl:  Lab Results  Component Value Date   EGFR 75 (L) 07/08/2015  Current Barriers:  Knowledge Deficits related long term care plan for management of diabetes in a patient with history of: HTN, hypercholesterolemia, iron deficiency anemia, gastroparesis, IBS, stomach tumor. Patient states she lives  alone and is able to complete ADL's without difficulty. Has limited support from her older sister. She reports abdominal pain for approximately 2 days. She reports "10" earlier, but reports has improved with medications and now reports a "7". Followed by Dr. Carlean Purl. Per patient diverticulosis/itis. She states she has been provided with types of food to eat by gastroenterologist. Patient reports she has been eating chicken soup. She reports blood sugar this morning was 120. She states she now has continuous glucose meter until 12/15/20. She states clinical pharmacist following up with her to review. Ms. Forsee reports she has been gradually decreasing gabapentin and decreased to 1 pill at night for the past 2 weeks. She states she stopped gabapentin completely last night and will follow up with neurologist as scheduled.  She has not checked her blood pressure at home, but last two office visits 11/23/20 130/70 HR 60 and 11/01/20 110/56 HR 64. Limited Social Support Does not adhere to prescribed medication regimen Lacks social connections Case Manager Clinical Goal(s):  patient will demonstrate improved adherence to prescribed treatment plan for diabetes self care/management as evidenced by: improved daily monitoring and recording of CBG, adherence to ADA/ carb modified diet, increase activity, improved adherence to prescribed medication regimen, contacting provider for new or worsened symptoms or questions Interventions:  Collaboration with [name], MD regarding development and update of comprehensive plan of care as evidenced by provider attestation and co-signature Inter-disciplinary care team collaboration (see longitudinal plan of care) Reviewed medications with patient and discussed importance of taking medications as prescribed.  Reviewed scheduled/upcoming provider appointments: 12/25/20 clinical pharmacist; 12/20/20 lab; 12/23/20 Dr. Leonie Man; 01/07/21 Dr. Carlean Purl.  Care coordination with clinical pharmacist. Encouraged patient to eat foods recommended by gastroenterologist. Encouraged patient to call gastroenterologist if condition does not improve or worsens. RNCM encouraged patient to keep track of blood sugars especially during times when her usual meal intake has changed. Discussed plans with patient for ongoing care management follow up and provided patient with direct contact information for care management team Self-Care Activities - Attends all scheduled provider appointments Patient Goals: - call your GI/Gastroenterologist doctor or your primary care provider, if condition does not improve or worsens or if you have any questions or problems. - keep track of blood sugars especially during times when your usual meal intake has changed. - eat the foods recommended by gastroenterologist for GI  flares and call if you have any questions.  - check blood sugar at prescribed times and check blood sugar if you feel it is too high or too low - take the blood sugar log and meter to all doctor visits - attend provider visits as scheduled: 12/25/20 clinical pharmacist; 12/20/20 lab; 12/23/20 Dr. Leonie Man; 01/07/21 Dr. Carlean Purl.  - work with care management team for ongoing care management and care coordination needs.    Plan:Telephone follow up appointment with care management team member scheduled for:  next month The patient has been provided with contact information for the care management team and has been advised to call with any health related questions or concerns.   Thea Silversmith, RN, MSN, BSN, CCM Care Management Coordinator [facility] [phone]

## 2020-12-13 NOTE — Patient Instructions (Signed)
Visit Information: Thank you for taking the time to speak with me. Please see goals we discussed below.    Goals Addressed             This Visit's Progress    Monitor and Manage My Health Conditions       Timeframe:  Long-Range Goal Priority:  Medium Start Date:  11/09/2020                           Expected End Date:  05/10/2021                     Follow Up Date 01/10/21    Patient Goals: - call your GI/Gastroenterologist doctor or your primary care provider, if condition does not improve or worsens or if you have any questions or problems. - keep track of blood sugars especially during times when your usual meal intake has changed. - eat the foods recommended by gastroenterologist for GI flares and call if you have any questions.  - check blood sugar at prescribed times and check blood sugar if you feel it is too high or too low - take the blood sugar log and meter to all doctor visits - attend provider visits as scheduled: 12/25/20 clinical pharmacist; 12/20/20 lab; 12/23/20 Dr. Leonie Man; 01/07/21 Dr. Carlean Purl.  - work with care management team for ongoing care management and care coordination needs.         Patient verbalizes understanding of instructions provided today and agrees to view in [place].   Telephone follow up appointment with care management team member scheduled for: 01/10/2021 The patient has been provided with contact information for the care management team and has been advised to call with any health related questions or concerns.   Thea Silversmith, RN, MSN, BSN, CCM Care Management Coordinator [facility] [phone]

## 2020-12-15 ENCOUNTER — Telehealth: Payer: Self-pay | Admitting: Internal Medicine

## 2020-12-15 ENCOUNTER — Ambulatory Visit: Payer: Medicare Other | Admitting: Pharmacist

## 2020-12-15 ENCOUNTER — Telehealth: Payer: Self-pay | Admitting: Family Medicine

## 2020-12-15 DIAGNOSIS — G500 Trigeminal neuralgia: Secondary | ICD-10-CM

## 2020-12-15 DIAGNOSIS — K3184 Gastroparesis: Secondary | ICD-10-CM

## 2020-12-15 DIAGNOSIS — E1169 Type 2 diabetes mellitus with other specified complication: Secondary | ICD-10-CM

## 2020-12-15 NOTE — Chronic Care Management (AMB) (Signed)
Chronic Care Management Pharmacy Note  12/15/2020 Name:  [name] MRN:  [id number] DOB:  08-31-1940  Subjective: [name] is an 80 y.o. year old female who is a primary patient of [name], MD.  The CCM team was consulted for assistance with disease management and care coordination needs.    Engaged with patient by telephone for follow up visit in response to provider referral for pharmacy case management and/or care coordination services.   Consent to Services:  The patient was given information about Chronic Care Management services, agreed to services, and gave verbal consent prior to initiation of services.  Please see initial visit note for detailed documentation.   Patient Care Team: [name], MD as PCP - General (Family Medicine) Minus Breeding, MD as PCP - Cardiology (Cardiology) Gatha Mayer, MD as Consulting Physician (Gastroenterology) Love, Alyson Locket, MD (Neurology) Pieter Partridge, DO as Consulting Physician (Neurology) [name], RN as Case Manager [name], RPH-CPP (Pharmacist)  Recent office visits:  11/01/20-Dr Charlett Blake (PCP) General follow up visit. Labs ordered. Patient states Gabapentin has not helped and she has had enough side effects that she is preparing to stop it so she will drop from 300 to 200 mg daily x 1 month then 1 tab daily x 1 month then stop. Drop Metformin to 1 tab daily. Follow up in 3 months. 08/19/20-Dr Charlett Blake (PCP) Seen for general follow up visit. Follow up in 7 weeks. 08/11/20- Dr Nani Ravens (PCP office)  Seen for sore throat. OK to take Tylenol 1000 mg (2 extra strength tabs) or 975 mg (3 regular strength tabs) every 6 hours as needed. Consider throat lozenges, salt water gargles and an air humidifier for symptomatic care.  07/06/20-Dr Charlett Blake (PCP) Seen for inflammation and numbness on the left side of her head. Labs ordered. Follow up in 6 weeks. 06/22/20-Dr Charlett Blake (PCP) Seen for Numbness. Ambulatory  referral to Neurology. Labs ordered..   Recent consult visits:  08/03/20-(Neurology) Stephan Minister. Tomi Likens, DO  Consult visit for headache. Start gabapentin - increase dose as instructed. MRI ordered. Follow up in 6 months. 06/16/20- Hematology [facility]) Anemia follow up visit.  05/26/20-(Gastroenterology) Gatha Mayer, MD  Seen for abdominal pain. Patient was given a testing kit to check for small intestine bacterial overgrowth.   Hospital visits:   08/03/20 ED visit at [facility]. due to Chronic left shoulder pain.   New?Medications Started at [facility] Discharge:?? Oxycodone 5 mg every 8 hours PRN Tylenol 325 mg every 6 hours PRN   Medication Changes at Hospital Discharge: None noted   Medications Discontinued at Hospital Discharge: None noted  Objective:  Lab Results  Component Value Date   CREATININE 0.80 11/01/2020   CREATININE 0.75 07/29/2020   CREATININE 0.83 07/13/2020    Lab Results  Component Value Date   HGBA1C 6.3 11/01/2020   Last diabetic Eye exam:  Lab Results  Component Value Date/Time   HMDIABEYEEXA No Retinopathy 02/28/2019 12:00 AM    Last diabetic Foot exam: No results found for: HMDIABFOOTEX      Component Value Date/Time   CHOL 190 11/01/2020 1441   TRIG 200.0 (H) 11/01/2020 1441   TRIG 94 11/16/2005 0905   HDL 65.50 11/01/2020 1441   CHOLHDL 3 11/01/2020 1441   VLDL 40.0 11/01/2020 1441   LDLCALC 84 11/01/2020 1441   LDLCALC 116 (H) 11/13/2019 0906   LDLDIRECT 95.0 03/25/2020 1149    Hepatic Function Latest Ref Rng &  Units 11/01/2020 07/29/2020 07/13/2020  Total Protein 6.0 - 8.3 g/dL 7.3 7.0 6.9  Albumin 3.5 - 5.2 g/dL 4.7 4.6 4.5  AST 0 - 37 U/L 20 23 20   ALT 0 - 35 U/L 20 29 35  Alk Phosphatase 39 - 117 U/L 71 59 46  Total Bilirubin 0.2 - 1.2 mg/dL 0.6 0.8 1.0  Bilirubin, Direct 0.0 - 0.3 mg/dL - - -    Lab Results  Component Value Date/Time   TSH 2.16 11/01/2020 02:41 PM   TSH 1.71 06/28/2020 09:15 AM   FREET4  0.97 08/08/2019 11:57 AM    CBC Latest Ref Rng & Units 11/01/2020 07/13/2020 07/06/2020  WBC 4.0 - 10.5 K/uL 8.8 12.1(H) 16.4(H)  Hemoglobin 12.0 - 15.0 g/dL 12.6 13.1 13.4  Hematocrit 36.0 - 46.0 % 37.7 37.8 39.0  Platelets 150.0 - 400.0 K/uL 217.0 197.0 259.0    Lab Results  Component Value Date/Time   VD25OH 49.54 11/01/2020 02:41 PM   VD25OH 48.29 06/28/2020 10:41 AM    Clinical ASCVD: Yes  The 10-year ASCVD risk score (Arnett DK, et al., 2019) is: 53.7%   Values used to calculate the score:     Age: 82 years     Sex: Female     Is Non-Hispanic African American: No     Diabetic: Yes     Tobacco smoker: No     Systolic Blood Pressure: 956 mmHg     Is BP treated: Yes     HDL Cholesterol: 65.5 mg/dL     Total Cholesterol: 190 mg/dL    Other:   DEXA 04/26/2020: Femur Neck Right  T-score +0.6. L3 was excluded due to degenerative changes  AP Spine L1-L4 (L3)  Normal +1.9  DualFemur Neck Right Normal +0.6   Social History   Tobacco Use  Smoking Status Never  Smokeless Tobacco Never  Tobacco Comments   Never used tobacco   BP Readings from Last 3 Encounters:  11/23/20 130/70  11/01/20 110/66  08/19/20 122/66   Pulse Readings from Last 3 Encounters:  11/23/20 60  11/01/20 64  08/19/20 69   Wt Readings from Last 3 Encounters:  11/23/20 166 lb 12.8 oz (75.7 kg)  11/01/20 166 lb 3.2 oz (75.4 kg)  08/19/20 166 lb 6.4 oz (75.5 kg)    Assessment: Review of patient past medical history, allergies, medications, health status, including review of consultants reports, laboratory and other test data, was performed as part of comprehensive evaluation and provision of chronic care management services.   SDOH:  (Social Determinants of Health) assessments and interventions performed:     CCM Care Plan  Allergies  Allergen Reactions   Tramadol Other (See Comments)    Dizziness     Medications Reviewed Today     Reviewed by [name], RPH-CPP (Pharmacist) on  12/15/20 at 1503  Med List Status: <None>   Medication Order Taking? Sig Documenting Provider Last Dose Status Informant  acetaminophen (TYLENOL) 325 MG tablet [id number] Yes Take 2 tablets (650 mg total) by mouth every 6 (six) hours as needed for up to 30 doses for mild pain or moderate pain. Wyvonnia Dusky, MD Taking Active   amLODipine (NORVASC) 5 MG tablet [id number] Yes TAKE 1 TABLET BY MOUTH  DAILY Minus Breeding, MD Taking Active   aspirin EC 81 MG tablet [id number] Yes Take 81 mg by mouth daily. Swallow whole. [provider] Taking Active Self  betamethasone valerate ointment (VALISONE) 0.1 % [id number] Yes Apply qod to affected  area, small amount [name], MD Taking Active   Blood Glucose Monitoring Suppl [facility]) w/Device Drucie Opitz [id number] Yes Use to check blood sugars BID E11.69 [name], MD Taking Active   cholecalciferol (VITAMIN D) 1000 UNITS tablet [id number] Yes Take 1,000 Units by mouth daily. [provider] Taking Active Self  dicyclomine (BENTYL) 10 MG capsule [id number] Yes TAKE 1 CAPSULE BY MOUTH 4  TIMES DAILY AS NEEDED Gatha Mayer, MD Taking Active   furosemide (LASIX) 20 MG tablet [id number] Yes Take 1 tablet (20 mg total) by mouth daily as needed. [name], MD Taking Active   gabapentin (NEURONTIN) 100 MG capsule [id number] Yes 3 capsules daily, one in the morning, and two at night. Pieter Partridge, DO Taking Active   hydrALAZINE (APRESOLINE) 10 MG tablet [id number] Yes Take 10 mg by mouth as needed. [provider] Taking Active   Lancets Glory Rosebush ULTRASOFT) lancets [id number] Yes USE AS DIRECTED 3 TIMES  DAILY [name], MD Taking Active   losartan (COZAAR) 50 MG tablet [id number] Yes TAKE 1 TABLET BY MOUTH  TWICE DAILY [name], MD Taking Active   metFORMIN (GLUCOPHAGE-XR) 500 MG 24 hr tablet [id number] Yes TAKE 1 TABLET BY MOUTH IN  THE MORNING AND AT BEDTIME [name], MD Taking Active    metoCLOPramide (REGLAN) 5 MG tablet [id number] Yes TAKE 1 TABLET BY MOUTH  TWICE DAILY  Patient taking differently: Take 5 mg by mouth 2 (two) times daily as needed.   Gatha Mayer, MD Taking Active   metoprolol tartrate (LOPRESSOR) 50 MG tablet [id number] Yes TAKE 1 AND 1/2 TABLETS BY  MOUTH TWICE DAILY Minus Breeding, MD Taking Active   NEXIUM 40 MG capsule [id number] Yes Take 1 capsule (40 mg total) by mouth daily before breakfast. Gatha Mayer, MD Taking Active   ondansetron [facility]) 4 MG tablet [id number] Yes Take 4 mg by mouth every 4 (four) hours as needed. For nausea Renato Shin, MD Taking Active Self  [facility] ULTRA test strip [id number] Yes CHECK BLOOD SUGAR 3 TIMES  DAILY OR AS NEEDED. [name], MD Taking Active   potassium chloride (KLOR-CON) 10 MEQ tablet [id number] Yes TAKE 1 TABLET BY MOUTH  DAILY [name], MD Taking Active   rosuvastatin (CRESTOR) 20 MG tablet [id number] Yes TAKE 1 TABLET BY MOUTH  DAILY [name], MD Taking Active   thiamine (VITAMIN B-1) 100 MG tablet [id number] Yes Take 100 mg by mouth every other day. 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   Renal insufficiency 06/18/2019   Educated about COVID-19 virus infection 05/15/2019   Atrophic vaginitis 01/20/2019   Stenosis of carotid artery 11/12/2018   Anxiety 10/13/2018   Thrush 12/07/2017   Sinusitis 10/11/2017   Headache 07/12/2017   Dizzy spells 11/21/2016   Constipation 11/21/2016   Nonrheumatic aortic valve stenosis 05/23/2016   Aortic atherosclerosis ([place]) 05/23/2016   Hematuria 03/16/2016   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   Pain of upper abdomen 06/06/2015   Neck pain 04/22/2015   Ear  pain 02/28/2015   Abnormal urine 11/21/2014   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  Anemia 06/08/2014   RLS (restless legs syndrome) 11/23/2013   Diabetic peripheral neuropathy ([place]) 10/29/2013   Left-sided thoracic back pain 10/06/2013   Encounter for preventative adult health care exam with abnormal findings 09/14/2013   Iron deficiency anemia    Status post laparoscopy 02/25/2013   Hyponatremia 01/09/2013   GERD (gastroesophageal reflux disease) 01/09/2013   Amaurosis fugax of left eye 10/16/2012   Low back pain 06/03/2012   Vitamin D deficiency 03/11/2012   Bilateral hand pain 10/20/2011   Encounter for long-term (current) use of other medications 10/20/2011   IBS (irritable bowel syndrome) 08/14/2011   TIA (transient ischemic attack) 02/10/2011   Abnormal brain CT 01/19/2011   Allergic rhinitis 10/01/2010   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   FUNDIC GLAND POLYPS OF STOMACH 03/18/2010   Abdominal pain in female 03/18/2010   Pain in joint 00/45/9977   SYSTOLIC MURMUR 41/42/3953   Paroxysmal supraventricular tachycardia ([place]) 01/12/2009   PLANTAR FASCIITIS 06/08/2008   CHEST PAIN 05/18/2008   Gastroparesis 12/18/2007   HYPERCHOLESTEROLEMIA 06/11/2007   Diabetes mellitus type 2 in obese ([place]) 09/05/2006   Essential hypertension 09/05/2006    Immunization History  Administered Date(s) Administered   Fluad Quad(high Dose 65+) 11/18/2019, 12/01/2020   Influenza Split 10/31/2010, 11/28/2011   Influenza Whole 11/19/2007, 11/04/2008, 11/10/2009   Influenza, High Dose Seasonal PF 11/11/2015, 11/21/2016   Influenza,inj,Quad PF,6+ Mos 01/09/2013, 10/20/2013, 11/13/2014, 11/06/2018   PFIZER(Purple Top)SARS-COV-2 Vaccination 03/14/2019, 04/09/2019, 12/29/2019   Pneumococcal Conjugate-13 09/11/2013   Pneumococcal Polysaccharide-23 03/13/2007   Td 03/02/2005    Conditions to be addressed/monitored: HTN, HLD, DMII, and gastroparesis; IBS; GERD;  iron malabsorption / anemia; history of low thiamin; neuropathy / trigeminal neuralgia; h/o TIA; carotid artery stenosis;   Care Plan : General Pharmacy (Adult)  Updates made by [name], RPH-CPP since 12/15/2020 12:00 AM     Problem: HTN, type 2 DM; h/o TIA; hyperlipidemia; aorotic valve stenosis; paroxysmal SVT; hyponatremia; gastroparesis; IBS; GERD; iron malabsortion.   Priority: High  Onset Date: 11/16/2020     Long-Range Goal: Provide education, support and care coordination for medication therapy and chronic conditions   Start Date: 11/16/2020  Priority: High  Note:   Current Barriers:  Unable to independently monitor therapeutic efficacy Unable to achieve control of hyperlipidemia  Unable to maintain control of type 2 diabetes and hypertension Has not made recommended medication changes following last appointment / labs with primary care physician  Pharmacist Clinical Goal(s):  Over the next 90 days, patient will achieve adherence to monitoring guidelines and medication adherence to achieve therapeutic efficacy achieve control of hyperlipidmeia  as evidenced by LDL <70 maintain control of type 2 diabetes and hypertension  as evidenced by A1c <7.0, FBG 80 to 130 and post prandial blood glucose <180 and blood pressure <140/90  adhere to prescribed medication regimen as evidenced by refill history and self reporting  through collaboration with PharmD and provider.   Interventions: 1:1 collaboration with [name], MD regarding development and update of comprehensive plan of care as evidenced by provider attestation and co-signature Inter-disciplinary care team collaboration (see longitudinal plan of care) Comprehensive medication review performed; medication list updated in electronic medical record  Diabetes: A1c is at goal of <7.0% however patient report home blood glucose has not been at goal Last A1c was 6.3% (11/01/2020) Current treatment: Metformin ER 511m -  take 1 tablet each morning (since our last visti 12/01/2020 patient has only taken 1 tablet daily) Current glucose readings (from  12/01/2020 to 12/15/2020)                                             She does eat a snack prior to bedtime some night but continues to have questions about what she can eat (provided handout at previous visit)  Has been seen by endocrinologist in past (Dr Loanne Drilling) when her husband was still alive. He was type 2 diabetic and required insulin therapy. Current meal patterns: limits salt intake and fast food. Also limiting intake of sugar containing foods. She does eat an egg every day.  Intervention Reviewed Continuous Glucose Monitor report with patient - blood glucose looks well controlled. Some blood glucose excursions to 180 - 220 after meals but over all she is 92% of time in target range. Patient was most reassured that she was not having frequent lows.   Provided more ideas for bedtime snacks - no salt pretzels (6 or 7) with peanut butter; applesause; yogurt and 4 or 5 blueberries; 2 or 3 crackers and low fat cheese. (Patient eats little dairy)  Education provided about recognition and treatment of hypoglycemia If you have a low blood sugar less than 70, please eat 15 grams of carbohydrates (4 oz of juice, soda, 4 glucose tablets, or 3-4 pieces of hard candy. 1 teaspoon of honey or sugar).  Wait 15 minutes and then recheck your blood sugar. If your sugar is still less than 70, eat another 15 grams of carbohydrates.  Wait another 15 minutes and recheck your glucose. Continue this until your sugar is over 70.  Once blood glucose is >70 you should eat a snack like crackers with peanut butter, apple with peanut butter or yogurt to prevent your sugar from dropping again. Reminded to get yearly eye exam Reviewed home blood glucose readings and reviewed goals  Fasting blood glucose goal (before meals) = 80 to 130 Blood glucose goal after a meal = less than 180    Hypertension / Paroxysmal SVT: Controlled per last office visit Blood pressure goal < 140/90  BP Readings from Last 3 Encounters:  11/23/20 130/70  11/01/20 110/66  08/19/20 122/66  Current treatment: Metoprolol 73m twice a day Amlodipine 528mdaily in evening Hydralazine 1073ms needed for elevated blood pressure (has not taken recently)  Losartan 82m48mice a day  Denies hypotensive/hypertensive symptoms Interventions:  Reviewed blood pressure goals Continue to check blood pressure at home once a day Reminded patient to make sure she has sat / rested for at least 5 mintues before checking blood pressure at home.  Continue current blood pressure medications  Hyperlipidemia / history of TIA / stenosis of carotid artery: Uncontrolled LDL goal < 70 LDL was 84 (11/01/2020) Current treatment:  Rosuvastatin 20mg71mly Aspirin 81mg 36my  Interventions:  Reviewed refill history - filled 90 days supply of rosuvastatin 05/05/2020, 07/29/2020 and 10/19/2020.  Recommended limit intake of saturated and trans fat.  If LDL still > 70 at next lipid check, consider addition of ezetimibe 10mg d32m  Gastroparesis / Irritable Bowel Syndrome / Acid reflux:  Managed by Dr GessnerCarlean Purlllowing any specific diet related to gastroparesis.  Current treatment:  Ondansetron 4mg - t43m 1 tablet every 4 hour as needed for nausea Dicyclomine 10mg up 66m times a day as needed (taking less than daily)  Esomeprazole 40mg dail76mfore breakfast (not taking regularly) Metoclopramide 5mg20m  twice a day Interventions:  Eat small frequent meals due to gastroparesis Can take esomeprazole 20 or 46m over the counter for acid reflux Also can take pepcid / famotidine 212mat night if needed for acid reflux Continue to follow up with Dr GeCarlean PurlNeuropathy / trigeminal neuralgia:  Patient states she only had facial pain related to trigeminal neuralgia once Initially tried duloxetine 08/11/2020 Reports  dizziness and drowsiness with gabapentin Current treatment:  none Patient was able to taper off gabapentin. Reports that she has not had any numbness or facial pain. Interventions:  Remain office gabapentin Notify office if any increase in facial / nerve pain.   Anemia / Low B1 (thiamin):  Managed by hematology but patient has not been in awhile due to past cost of IV iron infusions  Last CBC Lab Results  Component Value Date   WBC 8.8 11/01/2020   HGB 12.6 11/01/2020   HCT 37.7 11/01/2020   MCV 87.4 11/01/2020   MCH 30.0 06/16/2020   RDW 12.1 11/01/2020   PLT 217.0 11/01/2020  Last B1 was elevated at 77 (11/01/2020) Patient reports she was only taking thiamin / B1 10098mvery other day at time of last lab Dr BlyCharlett Blakecommended cut dose of thiamin in half.  Interventions:   Recommend take thiamin 36m76mery other day - provided pharmacies that might have  36mg12mength  Recheck thiamine level in 2 months   Health Maintenance:  Reviewed vaccination history and discussed benefits of COVID booster and Singrix vaccinations Plan to get COVID booster in 2 to 3 weeks (she did not want to get flu and booster together)  Plan to get Shingrix series in 2023.   Patient Goals/Self-Care Activities Over the next 90 days, patient will:  take medications as prescribed,  check glucose daily , document, and provide at future appointments,  check blood pressure daily, document, and provide at future appointments lower dose for thiamin to 36mg 85my OTHER day lower dose of metformin to 500mg d3m Stop  gabapentin - report any increase in pain  Follow Up Plan: Telephone follow up appointment with care management team member scheduled for:  2 weeks           Medication Assistance: None required.  Patient affirms current coverage meets needs.  Patient's preferred pharmacy is:  [facility] [place] [place] [phone]r[address]oRichwoodP[address] [phone] BLoma MarP[address] [phone] HKilbarchan [facility] Centerry ([facility]) - [place] [place] [phone] Up:  Patient agrees to Care Plan and Follow-up.  Plan: Telephone follow up appointment with care management team member scheduled for:  2 months  Levonia Wolfley ECherre RobinsD Clinical Pharmacist [facility]

## 2020-12-15 NOTE — Telephone Encounter (Signed)
Triage nurse Melanie called and stated that patient has severe abdominal pain and they recommended her to go to ER, but patient refused.  Patient wanted an appointment today.  Advised nurse we did not have any openings and that I will speak to patient.    Spoke with patient and she stated that she is having severe abdominal pain for 3 days and that it may be flare up of diverticulitis.  I advised her that she needs to go to ER and be evaluated because tests and scans can be done urgently rather wait for an appointment.  She she stated that she had a bad experience there and does not want to go back.  She stated also that this just may be a urinary problem as she has urinary frequency.  She stated that she had called her GI doctor and is waiting on a call back.  Appointment made with Mickel Baas tomorrow, because she still refuse to go to [facility] and no other office.  She will call and cancel if her GI calls her back.

## 2020-12-15 NOTE — Patient Instructions (Signed)
Visit Information  Diabetes: A1c is at goal of <7.0% however patient reports home blood glucose has not been at goal Last A1c was 6.3% (11/01/2020) Current treatment: Metformin ER 500mg  - take 2 tablets daily  Interventions:  Eat a small snack prior to bedtime to prevent low blood glucose during sleep Education provided about recognition and treatment of hypoglycemiz If you have a low blood sugar less than 70, please eat 15 grams of carbohydrates (4 oz of juice, soda, 4 glucose tablets, or 3-4 pieces of hard candy). Wait 15 minutes and then recheck your blood sugar. If your sugar is still less than 70, eat another 15 grams of carbohydrates. Wait another 15 minutes and recheck your glucose. Continue this until your sugar is over 70. Then, eat a snack like crackers with peanut butter, apply with peanut butter or yogurt to prevent your sugar from dropping again. Call Dr Digby's office to make appointment for your yearly eye exam Reviewed home blood glucose readings and reviewed goals  Fasting blood glucose goal (before meals) = 80 to 130 Blood glucose goal after a meal = less than 180   Hypertension / Paroxysmal SVT: Controlled per last office visits but patient states blood pressure at home has been elevated Blood pressure goal < 140/90  BP Readings from Last 3 Encounters:  11/01/20 110/66  08/19/20 122/66  08/11/20 120/70   Current treatment: Metoprolol 50mg  twice a day Amlodipine 5mg  daily in evening Hydralazine 10mg  as needed for elevated blood pressure (has not taken recently)  Losartan 50mg  twice a day  Interventions:  Reviewed blood pressure goals Continue to check blood pressure at home once a day Reminded patient to make sure she has sat / rested for at least 5 mintues before checking blood pressure at home.  Continue current blood pressure medications  Hyperlipidemia / history of TIA / stenosis of carotid artery: Uncontrolled LDL goal < 70 LDL was 84 (11/01/2020) Current  treatment:  Rosuvastatin 20mg  daily Aspirin 81mg  daily  Interventions:  Reviewed refill history - filled 90 days supply of rosuvastatin 05/05/2020, 07/29/2020 and 10/19/2020.  Recommended limit intake of saturated and trans fat.    Gastroparesis / Irritable Bowel Syndrome / Acid reflux:  Goal: decrease acid reflux symptoms and improve abdominal discomfort Current treatment:  Ondansetron 4mg  - take 1 tablet every 4 hour as needed for nausea Dicyclomine 10mg  up to 4 times a day as needed (taking less than daily)  Esomeprazole 40mg  daily before breakfast (not taking regularly) Metoclopramide 5mg  twice a day Interventions:  Eat small frequent meals due to gastroparesis Can take esomeprazole 20 or 40mg  over the counter for acid reflux Also can take pepcid / famotidine 20mg  at night if needed for acid reflux Continue to follow up with Dr Carlean Purl  Neuropathy / trigeminal neuralgia:  Goal: decrease nerve / facial pain while minimizing side effects Current treatment:  none Interventions:  Notify office if any increase in facial / nerve pain.   Anemia / Low B1 (thiamin):   Last CBC Lab Results  Component Value Date   WBC 8.8 11/01/2020   HGB 12.6 11/01/2020   HCT 37.7 11/01/2020   MCV 87.4 11/01/2020   MCH 30.0 06/16/2020   RDW 12.1 11/01/2020   PLT 217.0 11/01/2020   Last B1 was elevated at 77 (11/01/2020) Patient reports she was only taking thiamin / B1 100mg  every other day at time of last lab Dr Charlett Blake recommended cut dose of thiamin in half.  Interventions:   Recommend take  thiamin 50mg  every other day  Recheck thiamine level in 2 months   Health Maintenance:  Reviewed vaccination history and discussed benefits of COVID booster and Singrix vaccinations Plan to get COVID booster in 2 to 3 weeks (she did not want to get flu and booster together)  Plan to get Shingrix series in 2023.   Patient Goals/Self-Care Activities Over the next 90 days, patient will:  take  medications as prescribed,  check glucose daily , document, and provide at future appointments,  check blood pressure daily, document, and provide at future appointments lower dose for thiamin to 50mg  every OTHER day lower dose of metformin to 500mg  daily Stop gabapentin  Follow Up Plan: Telephone follow up appointment with care management team member scheduled for:  2 months    Patient verbalizes understanding of instructions provided today and agrees to view in [place].   Telephone follow up appointment with care management team member scheduled for: 2 months  [name], PharmD Clinical Pharmacist [facility]

## 2020-12-15 NOTE — Telephone Encounter (Signed)
Pt has appt with [name], FNP on 12/16/20.   [facility] Day - ClientTELEPHONE ADVICE RECORDAccessNurse PatientName:[name] ReturnPhoneNumber:[phone]([phone](Secondary) Initial Comment Caller states she is experiencing abdominal pain . Caller states she is wanting to get a urine test done. Translation No Nurse Assessment Nurse: Lenon Curt, RN, Melanie Date/Time (Eastern Time): 12/15/2020 9:28:29 AM Confirm and document reason for call. If symptomatic, describe symptoms. ---Caller states she is experiencing abdominal pain since this weekend. Caller states she is wanting to get a urine test done. Does the patient have any new or worsening symptoms? ---Yes Will a triage be completed? ---Yes Related visit to physician within the last 2 weeks? ---No Does the PT have any chronic conditions? (i.e. diabetes, asthma, this includes High risk factors for pregnancy, etc.) ---Yes List chronic conditions. ---diverticulosis, too many to list, gabapentin stopped Is this a behavioral health or substance abuse call? ---No Guidelines Guideline Title Affirmed Question Affirmed Notes Nurse Date/Time Eilene Ghazi Time) Abdominal Pain - Female [1] SEVERE pain AND [2] age > 63 years Lenon Curt, RN, Threasa Beards 12/15/2020 9:29:54 AM Disp. Time Eilene Ghazi Time) Disposition Final User 12/15/2020 9:14:50 AM Attempt made - message left Allean Found 12/15/2020 9:35:31 AM Go to ED Now Yes Lenon Curt, RN, Melanie PLEASE NOTE: All timestamps contained within this report are represented as [place] Standard Time. CONFIDENTIALTY NOTICE: This fax transmission is intended only for the addressee. It contains information that is legally privileged, confidential or otherwise protected from use or disclosure. If you are not the intended recipient, you are strictly prohibited from reviewing, disclosing, copying using or disseminating any of this information or taking any action in  reliance on or regarding this information. If you have received this fax in error, please notify us immediately by telephone so that we can arrange for its return to [place]. Phone: [phone], Toll-Free: [phone], Fax: [phone] Page: 2 of 2 Call Id: [id number] [place] Disagree/Comply Comply Caller Understands Yes PreDisposition Call Doctor Care Advice Given Per Guideline GO TO ED NOW: * You need to be seen in the Emergency Department. * Go to the ED at _________[address] [place] now. Drive carefully. ANOTHER ADULT SHOULD DRIVE: * It is better and safer if another adult drives instead of you. BRING MEDICINES: NOTHING BY MOUTH: * Reason: Condition may need surgery and general anesthesia. CARE ADVICE given per Abdominal Pain, Female (Adult) guideline. Comments User: Erik Obey, RN Date/Time Eilene Ghazi Time): 12/15/2020 9:31:11 AM Called GI and no appts available. User: Erik Obey, RN Date/Time Eilene Ghazi Time): 12/15/2020 9:38:14 AM Called the backline and spoke to [facility] and requested to speak to patient directly, no able to see her immediately. Referrals GO TO FACILITY UNDECIDED

## 2020-12-15 NOTE — Telephone Encounter (Signed)
Soft bland diet Try 2 of the dicyclomine and a time to see if that helps more This should get better with some time  If gets severe she would have to go to the ER  Call back if not getting better otherwise

## 2020-12-15 NOTE — Telephone Encounter (Signed)
Pt states that she is having abdominal pain for three days. Pt states that she had diarrhea along with the pain but the diarrhea has resolved although she is still experiencing mid abdominal pain 7/10. Pt states that she has been taking the Dicyclomine since Sunday but its only helping a little. Please advise

## 2020-12-15 NOTE — Telephone Encounter (Signed)
Inbound call from patient stating she is having abdominal pain and insisted on speaking with a nurse.

## 2020-12-15 NOTE — Telephone Encounter (Signed)
Pt scheduled  

## 2020-12-15 NOTE — Telephone Encounter (Signed)
Pt called regarding severe abdominal pain. Transferred to triage.

## 2020-12-16 ENCOUNTER — Other Ambulatory Visit: Payer: Self-pay | Admitting: Family

## 2020-12-16 ENCOUNTER — Other Ambulatory Visit: Payer: Self-pay

## 2020-12-16 ENCOUNTER — Ambulatory Visit (INDEPENDENT_AMBULATORY_CARE_PROVIDER_SITE_OTHER): Payer: Medicare Other | Admitting: Family

## 2020-12-16 VITALS — BP 140/70 | HR 60 | Temp 97.5°F | Resp 18 | Wt 167.6 lb

## 2020-12-16 DIAGNOSIS — R1032 Left lower quadrant pain: Secondary | ICD-10-CM | POA: Diagnosis not present

## 2020-12-16 DIAGNOSIS — R350 Frequency of micturition: Secondary | ICD-10-CM | POA: Diagnosis not present

## 2020-12-16 LAB — COMPREHENSIVE METABOLIC PANEL
ALT: 19 U/L (ref 0–35)
AST: 17 U/L (ref 0–37)
Albumin: 4.5 g/dL (ref 3.5–5.2)
Alkaline Phosphatase: 57 U/L (ref 39–117)
BUN: 23 mg/dL (ref 6–23)
CO2: 27 mEq/L (ref 19–32)
Calcium: 9.8 mg/dL (ref 8.4–10.5)
Chloride: 97 mEq/L (ref 96–112)
Creatinine, Ser: 0.71 mg/dL (ref 0.40–1.20)
GFR: 80.7 mL/min (ref >60.00)
Glucose, Bld: 100 mg/dL — ABNORMAL HIGH (ref 70–99)
Potassium: 4.3 mEq/L (ref 3.5–5.1)
Sodium: 134 mEq/L — ABNORMAL LOW (ref 135–145)
Total Bilirubin: 0.8 mg/dL (ref 0.2–1.2)
Total Protein: 7 g/dL (ref 6.0–8.3)

## 2020-12-16 LAB — CBC WITH DIFFERENTIAL/PLATELET
Basophils Absolute: 0.1 10*3/uL (ref 0.0–0.1)
Basophils Relative: 0.7 % (ref 0.0–3.0)
Eosinophils Absolute: 0.1 10*3/uL (ref 0.0–0.7)
Eosinophils Relative: 1 % (ref 0.0–5.0)
HCT: 38.9 % (ref 36.0–46.0)
Hemoglobin: 13.2 g/dL (ref 12.0–15.0)
Lymphocytes Relative: 28.2 % (ref 12.0–46.0)
Lymphs Abs: 2.2 10*3/uL (ref 0.7–4.0)
MCHC: 33.9 g/dL (ref 30.0–36.0)
MCV: 85.2 fl (ref 78.0–100.0)
Monocytes Absolute: 0.7 10*3/uL (ref 0.1–1.0)
Monocytes Relative: 8.3 % (ref 3.0–12.0)
Neutro Abs: 4.9 10*3/uL (ref 1.4–7.7)
Neutrophils Relative %: 61.8 % (ref 43.0–77.0)
Platelets: 231 10*3/uL (ref 150.0–400.0)
RBC: 4.57 Mil/uL (ref 3.87–5.11)
RDW: 12.3 % (ref 11.5–15.5)
WBC: 7.9 10*3/uL (ref 4.0–10.5)

## 2020-12-16 LAB — POCT URINALYSIS DIPSTICK
Bilirubin, UA: NEGATIVE
Glucose, UA: NEGATIVE
Ketones, UA: NEGATIVE
Leukocytes, UA: NEGATIVE
Nitrite, UA: NEGATIVE
Protein, UA: NEGATIVE
Spec Grav, UA: 1.01 (ref 1.010–1.025)
Urobilinogen, UA: 0.2 E.U./dL
pH, UA: 7 (ref 5.0–8.0)

## 2020-12-16 MED ORDER — AMOXICILLIN-POT CLAVULANATE 875-125 MG PO TABS: 1.0000 | ORAL_TABLET | Freq: Two times a day (BID) | ORAL | 0 refills | Status: AC

## 2020-12-16 NOTE — Telephone Encounter (Signed)
Pt made aware of Dr. Gessner recommendations: Pt verbalized understanding with all questions answered.   

## 2020-12-16 NOTE — Progress Notes (Signed)
[name] is a 80 y.o. 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   Renal insufficiency 06/18/2019   Educated about COVID-19 virus infection 05/15/2019   Atrophic vaginitis 01/20/2019   Stenosis of carotid artery 11/12/2018   Anxiety 10/13/2018   Thrush 12/07/2017   Sinusitis 10/11/2017   Headache 07/12/2017   Dizzy spells 11/21/2016   Constipation 11/21/2016   Nonrheumatic aortic valve stenosis 05/23/2016   Aortic atherosclerosis (HCC) 05/23/2016   Hematuria 03/16/2016   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   Pain of upper abdomen 06/06/2015   Neck pain 04/22/2015   Ear pain 02/28/2015   Abnormal urine 11/21/2014   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   Anemia 06/08/2014   RLS (restless legs syndrome) 11/23/2013   Diabetic peripheral neuropathy ([place]) 10/29/2013   Left-sided thoracic back pain 10/06/2013   Encounter for preventative adult health care exam with abnormal findings 09/14/2013   Iron deficiency anemia    Status post laparoscopy 02/25/2013   Hyponatremia 01/09/2013   GERD (gastroesophageal reflux disease) 01/09/2013   Amaurosis fugax of left eye 10/16/2012   Low back pain 06/03/2012   Vitamin D deficiency 03/11/2012   Bilateral hand pain 10/20/2011   Encounter for long-term (current) use of other medications 10/20/2011   IBS (irritable bowel syndrome) 08/14/2011   TIA (transient ischemic attack) 02/10/2011   Abnormal brain CT 01/19/2011   Allergic rhinitis 10/01/2010   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 20 tablet 0   acetaminophen (TYLENOL) 325 MG tablet Take 2 tablets (650 mg total) by mouth every 6 (six) hours as needed for up to 30 doses for mild pain or moderate pain. 30 tablet 0   amLODipine (NORVASC) 5 MG tablet TAKE 1 TABLET BY MOUTH  DAILY 90 tablet 3   aspirin EC 81 MG tablet Take 81 mg by mouth daily. Swallow whole.     betamethasone valerate ointment (VALISONE) 0.1 % Apply qod to affected area, small amount 45 g 1   Blood Glucose Monitoring Suppl (ONETOUCH ULTRALINK) w/Device KIT Use to check blood sugars BID E11.69 1 kit 0   cholecalciferol (VITAMIN D) 1000 UNITS tablet Take 1,000 Units by mouth daily.     dicyclomine (BENTYL) 10 MG capsule TAKE 1 CAPSULE BY MOUTH 4  TIMES DAILY AS NEEDED 240 capsule 0   furosemide (LASIX) 20 MG tablet Take 1 tablet (20 mg total) by mouth daily as needed. 90 tablet 1   hydrALAZINE (APRESOLINE) 10 MG tablet Take 10 mg by mouth as needed.     Lancets (ONETOUCH ULTRASOFT) lancets USE AS DIRECTED 3 TIMES  DAILY 300 each 3   losartan (COZAAR)  50 MG tablet TAKE 1 TABLET BY MOUTH  TWICE DAILY 180 tablet 3   metFORMIN (GLUCOPHAGE-XR) 500 MG 24 hr tablet TAKE 1 TABLET BY MOUTH IN  THE MORNING AND AT BEDTIME 180 tablet 3   metoCLOPramide (REGLAN) 5 MG tablet TAKE 1 TABLET BY MOUTH  TWICE DAILY (Patient taking differently: Take 5 mg by mouth 2 (two) times daily as needed.) 180 tablet 0   metoprolol tartrate (LOPRESSOR) 50 MG tablet TAKE 1 AND 1/2 TABLETS BY  MOUTH TWICE DAILY 270 tablet 3   NEXIUM 40 MG capsule  Take 1 capsule (40 mg total) by mouth daily before breakfast. 90 capsule 3   ondansetron (ZOFRAN) 4 MG tablet Take 4 mg by mouth every 4 (four) hours as needed. For nausea     ONETOUCH ULTRA test strip CHECK BLOOD SUGAR 3 TIMES  DAILY OR AS NEEDED. 300 strip 3   potassium chloride (KLOR-CON) 10 MEQ tablet TAKE 1 TABLET BY MOUTH  DAILY 90 tablet 3   rosuvastatin (CRESTOR) 20 MG tablet TAKE 1 TABLET BY MOUTH  DAILY 90 tablet 3   thiamine (VITAMIN B-1) 100 MG tablet Take 100 mg by mouth every other day.     No current facility-administered medications for this visit.    Allergies: Tramadol  Past Medical History:  Diagnosis Date   Abdominal pain in female 03/18/2010   Qualifier: Diagnosis of  By: Carlean Purl MD, Tonna Boehringer E    Anemia 06/08/2014   Anxiety    Arthritis    Spinal Osteoarthritis   Cancer ([place])    Carcinoid tumor of stomach    Cataract    Chest pain    Myoview 12/15 no ischemia.   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 ([place]) 09/05/2006   Qualifier: Diagnosis of  By: Marca Ancona RMA, Lucy     Diabetic peripheral neuropathy ([place]) 10/29/2013   Encounter for preventative adult health care exam with abnormal findings 09/14/2013   Esophageal reflux    Gastric polyp    Fundic Gland   Gastroparesis    Headache(784.0)    Heart murmur    Echocardiogram 2/11: EF 60-65%, mild LAE, grade 1 diastolic dysfunction, aortic valve sclerosis, mean gradient 9 mm of mercury, PASP 34   Hematuria 03/16/2016   Iron deficiency anemia, unspecified    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   Leg swelling    bilateral   Neck pain 04/22/2015   PONV (postoperative nausea and vomiting)    pt states only needs small amount of anesthesia   PSVT (paroxysmal supraventricular tachycardia) ([place])    Pure hypercholesterolemia    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   Renal insufficiency 06/18/2019   Stroke ([place])    tia, 2014   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    Type II or unspecified type diabetes mellitus without mention of complication, not stated as uncontrolled    Unspecified essential hypertension    Unspecified hereditary and idiopathic peripheral neuropathy 10/29/2013    Past Surgical History:  Procedure Laterality Date   CHOLECYSTECTOMY  1993   COLONOSCOPY  11/11/2010   diverticulosis   DILATATION & CURRETTAGE/HYSTEROSCOPY WITH RESECTOCOPE N/A 02/25/2013   Procedure: Attempted hysteroscopy with uterine perforation;  Surgeon: Jamey Reas de Berton Lan, MD;  Location: [facility];  Service: Gynecology;  Laterality: N/A;   ESOPHAGOGASTRODUODENOSCOPY  08/29/2010; 09/15/2010   Carcinoid tumor less than 1 cm in July 2012 not seen in August 2012 ,  gastritis, fundic gland polyps   ESOPHAGOGASTRODUODENOSCOPY  05/16/2011   ESOPHAGOGASTRODUODENOSCOPY  06/14/2012   EUS  12/15/2010   Procedure: UPPER ENDOSCOPIC ULTRASOUND (EUS) LINEAR;  Surgeon: Owens Loffler, MD;  Location: [facility];  Service: Endoscopy;  Laterality: N/A;   EYE SURGERY Bilateral    Bi lateral cateracts and bi lateral laser   LAPAROSCOPY N/A 02/25/2013   Procedure: Cystoscopy and laparoscopy with fulguration of uterine serosa;  Surgeon: Jamey Reas de Berton Lan, MD;  Location: [facility];  Service: Gynecology;  Laterality: N/A;   TONSILLECTOMY      Family History  Problem Relation Age of Onset   Diabetes Mother    Stroke Father        deceased age 8   Heart disease Sister        deceased MI age 82   Heart disease Brother        deceased MI age 44   Diabetes Maternal Grandmother    Hypertension Paternal Grandmother    Diabetes Sister    Heart disease Sister    Hypertension Sister    Hyperlipidemia Sister    Diabetes Sister    Heart disease Sister    Hypertension Sister    Hyperlipidemia Sister    Diabetes Brother    Heart disease Brother    Hypertension Brother    Hyperlipidemia Brother    Colon cancer Neg Hx    Esophageal cancer Neg Hx    Stomach cancer Neg  Hx    Rectal cancer Neg Hx     Social History   Tobacco Use   Smoking status: Never   Smokeless tobacco: Never   Tobacco comments:    Never used tobacco  Substance Use Topics   Alcohol use: No    Alcohol/week: 0.0 standard drinks    Subjective:   Concerned for LLQ pain; history of diverticulitis and concerned for symptom presentation; also asking to get U/A checked to make sure she does have UTI; Does not want to update CT unless absolutely necessary;    Objective:  Vitals:   12/16/20 1146  BP: 140/70  Pulse: 60  Resp: 18  Temp: (!) 97.5 F (36.4 C)  TempSrc: Oral  SpO2: 97%  Weight: 167 lb 9.6 oz (76 kg)    General: Well developed, well nourished, in no acute distress  Skin : Warm and dry.  Head: Normocephalic and atraumatic  Eyes: Sclera and conjunctiva clear; pupils round and reactive to light; extraocular movements intact  Ears: External normal; canals clear; tympanic membranes normal  Oropharynx: Pink, supple. No suspicious lesions  Neck: Supple without thyromegaly, adenopathy  Lungs: Respirations unlabored; clear to auscultation bilaterally without wheeze, rales, rhonchi  CVS exam: normal rate and regular rhythm.  Abdomen: Soft; tender over LLQ;  nondistended; normoactive bowel sounds; no masses or hepatosplenomegaly  Musculoskeletal: No deformities; no active joint inflammation  Extremities: No edema, cyanosis, clubbing  Vessels: Symmetric bilaterally  Neurologic: Alert and oriented; speech intact; face symmetrical; moves all extremities well; CNII-XII intact without focal deficit   Assessment:  1. LLQ pain   2. Urinary frequency     Plan:  Concern for diverticulitis; patient prefers to treat with Augmentin; defers imaging at this time; check Cbc, CMP today; Check U/A and urine culture;  This visit occurred during the SARS-CoV-2 public health emergency.  Safety protocols were in place, including screening questions prior to the visit, additional usage of  staff PPE, and extensive cleaning of exam room while observing appropriate contact  time as indicated for disinfecting solutions.    No follow-ups on file.  Orders Placed This Encounter  Procedures   Urine Culture   CBC with Differential/Platelet   Comp Met (CMET)   POCT Urinalysis Dipstick    Requested Prescriptions   Signed Prescriptions Disp Refills   amoxicillin-clavulanate (AUGMENTIN) 875-125 MG tablet 20 tablet 0    Sig: Take 1 tablet by mouth 2 (two) times daily for 10 days.

## 2020-12-17 LAB — URINE CULTURE
MICRO NUMBER:: 12620767
Result:: NO GROWTH
SPECIMEN QUALITY:: ADEQUATE

## 2020-12-20 ENCOUNTER — Encounter: Payer: Self-pay | Admitting: Family

## 2020-12-20 ENCOUNTER — Inpatient Hospital Stay (HOSPITAL_BASED_OUTPATIENT_CLINIC_OR_DEPARTMENT_OTHER): Payer: Medicare Other | Admitting: Family

## 2020-12-20 ENCOUNTER — Inpatient Hospital Stay: Payer: Medicare Other | Attending: Hematology & Oncology

## 2020-12-20 ENCOUNTER — Other Ambulatory Visit: Payer: Self-pay

## 2020-12-20 VITALS — BP 129/55 | HR 59 | Temp 97.7°F | Resp 18 | Ht 61.0 in | Wt 166.4 lb

## 2020-12-20 DIAGNOSIS — R110 Nausea: Secondary | ICD-10-CM | POA: Diagnosis not present

## 2020-12-20 DIAGNOSIS — D508 Other iron deficiency anemias: Secondary | ICD-10-CM | POA: Insufficient documentation

## 2020-12-20 DIAGNOSIS — R197 Diarrhea, unspecified: Secondary | ICD-10-CM | POA: Insufficient documentation

## 2020-12-20 DIAGNOSIS — Z7982 Long term (current) use of aspirin: Secondary | ICD-10-CM | POA: Diagnosis not present

## 2020-12-20 DIAGNOSIS — R109 Unspecified abdominal pain: Secondary | ICD-10-CM | POA: Diagnosis not present

## 2020-12-20 DIAGNOSIS — K909 Intestinal malabsorption, unspecified: Secondary | ICD-10-CM | POA: Diagnosis not present

## 2020-12-20 DIAGNOSIS — Z79899 Other long term (current) drug therapy: Secondary | ICD-10-CM | POA: Diagnosis not present

## 2020-12-20 DIAGNOSIS — D500 Iron deficiency anemia secondary to blood loss (chronic): Secondary | ICD-10-CM

## 2020-12-20 LAB — CMP (CANCER CENTER ONLY)
ALT: 19 U/L (ref 0–44)
AST: 16 U/L (ref 15–41)
Albumin: 4.7 g/dL (ref 3.5–5.0)
Alkaline Phosphatase: 62 U/L (ref 38–126)
Anion gap: 11 (ref 5–15)
BUN: 24 mg/dL — ABNORMAL HIGH (ref 8–23)
CO2: 24 mmol/L (ref 22–32)
Calcium: 10 mg/dL (ref 8.9–10.3)
Chloride: 97 mmol/L — ABNORMAL LOW (ref 98–111)
Creatinine: 0.85 mg/dL (ref 0.44–1.00)
GFR, Estimated: >60 mL/min (ref >60)
Glucose, Bld: 141 mg/dL — ABNORMAL HIGH (ref 70–99)
Potassium: 4.4 mmol/L (ref 3.5–5.1)
Sodium: 132 mmol/L — ABNORMAL LOW (ref 135–145)
Total Bilirubin: 0.6 mg/dL (ref 0.3–1.2)
Total Protein: 7.3 g/dL (ref 6.5–8.1)

## 2020-12-20 LAB — CBC WITH DIFFERENTIAL (CANCER CENTER ONLY)
Abs Immature Granulocytes: 0.04 10*3/uL (ref 0.00–0.07)
Basophils Absolute: 0.1 10*3/uL (ref 0.0–0.1)
Basophils Relative: 1 %
Eosinophils Absolute: 0.1 10*3/uL (ref 0.0–0.5)
Eosinophils Relative: 1 %
HCT: 40.1 % (ref 36.0–46.0)
Hemoglobin: 13.5 g/dL (ref 12.0–15.0)
Immature Granulocytes: 1 %
Lymphocytes Relative: 25 %
Lymphs Abs: 2.2 10*3/uL (ref 0.7–4.0)
MCH: 28.7 pg (ref 26.0–34.0)
MCHC: 33.7 g/dL (ref 30.0–36.0)
MCV: 85.3 fL (ref 80.0–100.0)
Monocytes Absolute: 0.6 10*3/uL (ref 0.1–1.0)
Monocytes Relative: 7 %
Neutro Abs: 5.7 10*3/uL (ref 1.7–7.7)
Neutrophils Relative %: 65 %
Platelet Count: 235 10*3/uL (ref 150–400)
RBC: 4.7 MIL/uL (ref 3.87–5.11)
RDW: 11.6 % (ref 11.5–15.5)
WBC Count: 8.6 10*3/uL (ref 4.0–10.5)
nRBC: 0 % (ref 0.0–0.2)

## 2020-12-20 LAB — RETICULOCYTES
Immature Retic Fract: 3.5 % (ref 2.3–15.9)
RBC.: 4.74 MIL/uL (ref 3.87–5.11)
Retic Count, Absolute: 67.8 10*3/uL (ref 19.0–186.0)
Retic Ct Pct: 1.4 % (ref 0.4–3.1)

## 2020-12-20 NOTE — Progress Notes (Signed)
Hematology and Oncology Follow Up Visit  [name] [id number] 1940/09/03 80 y.o. 12/20/2020   Principle Diagnosis:  Iron deficiency anemia secondary to malabsorption   Current Therapy:        IV iron as indicated    Interim History:  [name] is here today for follow-up. She is having some left sided abdominal pain, occasional diarrhea, nausea without vomiting and is currently on an antibiotic for possible diverticulitis.  UA and culture with PCP were negative.  She has not noted any blood loss. No bruising or petechiae.  She notes fatigue.  No fever, chills, cough, rash, SOB, chest pain or palpitations. She still has intermittent numbness and tingling in her fingers. None in her feet at this time.  No falls or syncope to report.  She states that she has not had much of an appetite with the abdominal pain, diarrhea and nausea.  No fall or syncope to report.  She is doing her best to stay well hydrated. Her weight is stable at 166 lbs.   ECOG Performance Status: 1 - Symptomatic but completely ambulatory  Medications:  Allergies as of 12/20/2020       Reactions   Tramadol Other (See Comments)   Dizziness        Medication List        Accurate as of December 20, 2020  3:59 PM. If you have any questions, ask your nurse or doctor.          acetaminophen 325 MG tablet Commonly known as: Tylenol Take 2 tablets (650 mg total) by mouth every 6 (six) hours as needed for up to 30 doses for mild pain or moderate pain.   amLODipine 5 MG tablet Commonly known as: NORVASC TAKE 1 TABLET BY MOUTH  DAILY   amoxicillin-clavulanate 875-125 MG tablet Commonly known as: AUGMENTIN Take 1 tablet by mouth 2 (two) times daily for 10 days.   aspirin EC 81 MG tablet Take 81 mg by mouth daily. Swallow whole.   betamethasone valerate ointment 0.1 % Commonly known as: VALISONE Apply qod to affected area, small amount   cholecalciferol 1000 units tablet Commonly known  as: VITAMIN D Take 1,000 Units by mouth daily.   dicyclomine 10 MG capsule Commonly known as: BENTYL TAKE 1 CAPSULE BY MOUTH 4  TIMES DAILY AS NEEDED   furosemide 20 MG tablet Commonly known as: LASIX Take 1 tablet (20 mg total) by mouth daily as needed.   hydrALAZINE 10 MG tablet Commonly known as: APRESOLINE Take 10 mg by mouth as needed.   losartan 50 MG tablet Commonly known as: COZAAR TAKE 1 TABLET BY MOUTH  TWICE DAILY   metFORMIN 500 MG 24 hr tablet Commonly known as: GLUCOPHAGE-XR TAKE 1 TABLET BY MOUTH IN  THE MORNING AND AT BEDTIME   metoCLOPramide 5 MG tablet Commonly known as: REGLAN TAKE 1 TABLET BY MOUTH  TWICE DAILY What changed:  when to take this reasons to take this   metoprolol tartrate 50 MG tablet Commonly known as: LOPRESSOR TAKE 1 AND 1/2 TABLETS BY  MOUTH TWICE DAILY   NexIUM 40 MG capsule Generic drug: esomeprazole Take 1 capsule (40 mg total) by mouth daily before breakfast.   ondansetron 4 MG tablet Commonly known as: ZOFRAN Take 4 mg by mouth every 4 (four) hours as needed. For nausea   OneTouch Ultra test strip Generic drug: glucose blood CHECK BLOOD SUGAR 3 TIMES  DAILY OR AS NEEDED.   OneTouch UltraLink w/Device Kit Use  to check blood sugars BID E11.69   onetouch ultrasoft lancets USE AS DIRECTED 3 TIMES  DAILY   potassium chloride 10 MEQ tablet Commonly known as: KLOR-CON TAKE 1 TABLET BY MOUTH  DAILY   rosuvastatin 20 MG tablet Commonly known as: CRESTOR TAKE 1 TABLET BY MOUTH  DAILY   thiamine 100 MG tablet Commonly known as: Vitamin B-1 Take 100 mg by mouth every other day.        Allergies:  Allergies  Allergen Reactions   Tramadol Other (See Comments)    Dizziness     Past Medical History, Surgical history, Social history, and Family History were reviewed and updated.  Review of Systems: All other 10 point review of systems is negative.   Physical Exam:  height is 5' 1"  (1.549 m) and weight is 166  lb 6.4 oz (75.5 kg). Her oral temperature is 97.7 F (36.5 C). Her blood pressure is 129/55 (abnormal) and her pulse is 59 (abnormal). Her respiration is 18 and oxygen saturation is 100%.   Wt Readings from Last 3 Encounters:  12/20/20 166 lb 6.4 oz (75.5 kg)  12/16/20 167 lb 9.6 oz (76 kg)  11/23/20 166 lb 12.8 oz (75.7 kg)    Ocular: Sclerae unicteric, pupils equal, round and reactive to light Ear-nose-throat: Oropharynx clear, dentition fair Lymphatic: No cervical or supraclavicular adenopathy Lungs no rales or rhonchi, good excursion bilaterally Heart regular rate and rhythm, no murmur appreciated Abd soft, nontender, positive bowel sounds MSK no focal spinal tenderness, no joint edema Neuro: non-focal, well-oriented, appropriate affect Breasts: Deferred   Lab Results  Component Value Date   WBC 8.6 12/20/2020   HGB 13.5 12/20/2020   HCT 40.1 12/20/2020   MCV 85.3 12/20/2020   PLT 235 12/20/2020   Lab Results  Component Value Date   FERRITIN 427 (H) 11/01/2020   IRON 67 11/01/2020   TIBC 339 11/01/2020   UIBC 240 06/16/2020   IRONPCTSAT 20 11/01/2020   Lab Results  Component Value Date   RETICCTPCT 1.4 12/20/2020   RBC 4.74 12/20/2020   RETICCTABS 39.8 01/22/2015   No results found for: Nils Pyle [facility] Lab Results  Component Value Date   IGA 228 06/06/2011   No results found for: Kathrynn Ducking, MSPIKE, SPEI   Chemistry      Component Value Date/Time   NA 132 (L) 12/20/2020 1343   NA 136 05/08/2016 1305   NA 134 (L) 07/08/2015 1149   K 4.4 12/20/2020 1343   K 4.2 05/08/2016 1305   K 4.4 07/08/2015 1149   CL 97 (L) 12/20/2020 1343   CL 102 05/08/2016 1305   CO2 24 12/20/2020 1343   CO2 26 05/08/2016 1305   CO2 25 07/08/2015 1149   BUN 24 (H) 12/20/2020 1343   BUN 16 05/08/2016 1305   BUN 15.5 07/08/2015 1149   CREATININE 0.85 12/20/2020 1343   CREATININE 0.78 11/13/2019 0906    CREATININE 0.8 07/08/2015 1149      Component Value Date/Time   CALCIUM 10.0 12/20/2020 1343   CALCIUM 9.4 05/08/2016 1305   CALCIUM 10.0 07/08/2015 1149   ALKPHOS 62 12/20/2020 1343   ALKPHOS 56 05/08/2016 1305   ALKPHOS 62 07/08/2015 1149   AST 16 12/20/2020 1343   AST 24 07/08/2015 1149   ALT 19 12/20/2020 1343   ALT 29 05/08/2016 1305   ALT 30 07/08/2015 1149   BILITOT 0.6 12/20/2020 1343   BILITOT 0.64 07/08/2015 1149  Impression and Plan: [name] is a very pleasant 80 yo female with history of iron deficiency anemia secondary to malabsorption. Iron studies are pending. We will replace if needed.  Follow-up in 4 months.  She can contact our office with any questions or concerns.   Lottie Dawson, NP 11/14/20223:59 PM

## 2020-12-21 ENCOUNTER — Ambulatory Visit: Payer: Medicare Other | Admitting: Family

## 2020-12-21 LAB — IRON AND TIBC
Iron: 86 ug/dL (ref 41–142)
Saturation Ratios: 26 % (ref 21–57)
TIBC: 330 ug/dL (ref 236–444)
UIBC: 244 ug/dL (ref 120–384)

## 2020-12-21 LAB — FERRITIN: Ferritin: 495 ng/mL — ABNORMAL HIGH (ref 11–307)

## 2020-12-23 ENCOUNTER — Telehealth: Payer: Self-pay | Admitting: Family Medicine

## 2020-12-23 ENCOUNTER — Ambulatory Visit: Payer: Medicare Other | Admitting: Neurology

## 2020-12-23 NOTE — Telephone Encounter (Signed)
Patient states her stomach is still hurting and [name] had told her (per her 11/10 OV) that if the pain persists she would order an x-ray scan for her. Please advice.

## 2020-12-24 ENCOUNTER — Other Ambulatory Visit: Payer: Self-pay | Admitting: Family

## 2020-12-24 DIAGNOSIS — R1032 Left lower quadrant pain: Secondary | ICD-10-CM

## 2020-12-24 NOTE — Telephone Encounter (Signed)
Patient would like a CT instead.  She agree to do this time.

## 2021-01-03 ENCOUNTER — Telehealth: Payer: Self-pay | Admitting: Cardiology

## 2021-01-03 NOTE — Telephone Encounter (Signed)
Patient is calling stating she was billed for EKG. Insurance stated EKG needs to be resubmitted as routine so it will be covered.

## 2021-01-03 NOTE — Telephone Encounter (Signed)
Spoke with patient regarding her concerns about her [employer] insurance not covering the 11/23/20 EKG. She stated that the insurance will over the charge if the diagnosis for the EKG is documented as routine.

## 2021-01-05 DIAGNOSIS — E1169 Type 2 diabetes mellitus with other specified complication: Secondary | ICD-10-CM

## 2021-01-05 DIAGNOSIS — E669 Obesity, unspecified: Secondary | ICD-10-CM | POA: Diagnosis not present

## 2021-01-05 DIAGNOSIS — I10 Essential (primary) hypertension: Secondary | ICD-10-CM

## 2021-01-07 ENCOUNTER — Encounter: Payer: Self-pay | Admitting: Internal Medicine

## 2021-01-07 ENCOUNTER — Ambulatory Visit: Payer: Medicare Other | Admitting: Internal Medicine

## 2021-01-07 VITALS — BP 148/62 | HR 74 | Ht 61.0 in | Wt 169.0 lb

## 2021-01-07 DIAGNOSIS — K5732 Diverticulitis of large intestine without perforation or abscess without bleeding: Secondary | ICD-10-CM | POA: Diagnosis not present

## 2021-01-07 DIAGNOSIS — K30 Functional dyspepsia: Secondary | ICD-10-CM

## 2021-01-07 DIAGNOSIS — R109 Unspecified abdominal pain: Secondary | ICD-10-CM

## 2021-01-07 DIAGNOSIS — K3184 Gastroparesis: Secondary | ICD-10-CM

## 2021-01-07 DIAGNOSIS — K581 Irritable bowel syndrome with constipation: Secondary | ICD-10-CM

## 2021-01-07 DIAGNOSIS — G8929 Other chronic pain: Secondary | ICD-10-CM

## 2021-01-07 NOTE — Progress Notes (Signed)
[name] 80 y.o. 07/22/1940 [id number]  Assessment & Plan:   Encounter Diagnoses  Name Primary?   Diverticulitis of colon Yes   Chronic abdominal pain    Functional dyspepsia    Irritable bowel syndrome with constipation    Gastroparesis    It sounds like she did have diverticulitis of the colon.  She had a CT scan 18 months ago that showed diverticulosis but no diverticulitis, she was having left lower quadrant pain then.  We reviewed things in detail and I explained how she has irritable bowel and chronic recurrent abdominal pain issues and that since the acute attack of pain consistent with diverticulitis has resolved and based upon previous work-ups and evaluations I do not think further investigation is necessary.  She will follow-up as needed.  I explained to her that we do not always have openings to work people in, but if she is having severe or unusual pain to please put that into the communication with [place] and I would try to see her if possible though I do think she had excellent care from primary care.  CC: [name], MD   Subjective:   Chief Complaint: Abdominal pain  HPI 80 year old [place] woman with a long history of gastroparesis and fundic gland polyps and a remote history of a benign carcinoid tumor, who also has diverticulosis and chronic recurrent abdominal pain.  In early November she was having increasing abdominal pain typically has it in the left lower quadrant but this time was having problems more so on the right lower quadrant and suprapubic area.  She had contacted [place] and I recommended increasing her dicyclomine.  She was hoping for a visit with me but that was not possible at the time she saw primary care and was clinically diagnosed with diverticulitis and treated with Augmentin.  She continued through with that and feels better.  At that time she was wondering about having a CT scan.  As usual she wonders if "everything is okay".  She  feels what sounds like back to baseline and reports her chronic recurrent upper abdominal pain is better.  She did have some constipation when she was diagnosed with and treated for diverticulitis.  She also reports that she has had neuralgia pains neuropathy was treated with gabapentin with she believes that abdominal pain intensified after that.  She wants to know if I think that is possible.  I explained that I do not have significant knowledge about gabapentin and thought that that was probably unlikely to be related to her abdominal pain issues.  Wt Readings from Last 3 Encounters:  01/07/21 169 lb (76.7 kg)  12/20/20 166 lb 6.4 oz (75.5 kg)  12/16/20 167 lb 9.6 oz (76 kg)   Last colonoscopy was remote more than 10 years ago diverticulosis  Allergies  Allergen Reactions   Tramadol Other (See Comments)    Dizziness    Current Meds  Medication Sig   acetaminophen (TYLENOL) 325 MG tablet Take 2 tablets (650 mg total) by mouth every 6 (six) hours as needed for up to 30 doses for mild pain or moderate pain.   amLODipine (NORVASC) 5 MG tablet TAKE 1 TABLET BY MOUTH  DAILY   aspirin EC 81 MG tablet Take 81 mg by mouth daily. Swallow whole.   betamethasone valerate ointment (VALISONE) 0.1 % Apply qod to affected area, small amount   Blood Glucose Monitoring Suppl (ONETOUCH ULTRALINK) w/Device KIT Use to check blood sugars BID E11.69  cholecalciferol (VITAMIN D) 1000 UNITS tablet Take 1,000 Units by mouth daily.   dicyclomine (BENTYL) 10 MG capsule TAKE 1 CAPSULE BY MOUTH 4  TIMES DAILY AS NEEDED   furosemide (LASIX) 20 MG tablet Take 1 tablet (20 mg total) by mouth daily as needed.   hydrALAZINE (APRESOLINE) 10 MG tablet Take 10 mg by mouth as needed.   Lancets (ONETOUCH ULTRASOFT) lancets USE AS DIRECTED 3 TIMES  DAILY   losartan (COZAAR) 50 MG tablet TAKE 1 TABLET BY MOUTH  TWICE DAILY   metFORMIN (GLUCOPHAGE-XR) 500 MG 24 hr tablet TAKE 1 TABLET BY MOUTH IN  THE MORNING AND AT BEDTIME    metoCLOPramide (REGLAN) 5 MG tablet TAKE 1 TABLET BY MOUTH  TWICE DAILY (Patient taking differently: Take 5 mg by mouth 2 (two) times daily as needed.)   metoprolol tartrate (LOPRESSOR) 50 MG tablet TAKE 1 AND 1/2 TABLETS BY  MOUTH TWICE DAILY   NEXIUM 40 MG capsule Take 1 capsule (40 mg total) by mouth daily before breakfast.   ondansetron (ZOFRAN) 4 MG tablet Take 4 mg by mouth every 4 (four) hours as needed. For nausea   ONETOUCH ULTRA test strip CHECK BLOOD SUGAR 3 TIMES  DAILY OR AS NEEDED.   potassium chloride (KLOR-CON) 10 MEQ tablet TAKE 1 TABLET BY MOUTH  DAILY   rosuvastatin (CRESTOR) 20 MG tablet TAKE 1 TABLET BY MOUTH  DAILY   thiamine (VITAMIN B-1) 100 MG tablet Take 100 mg by mouth every other day.   Past Medical History:  Diagnosis Date   Abdominal pain in female 03/18/2010   Qualifier: Diagnosis of  By: Carlean Purl MD, Dimas Millin    Anemia 06/08/2014   Anxiety    Arthritis    Spinal Osteoarthritis   Cancer ([place])    Carcinoid tumor of stomach    Cataract    Chest pain    Myoview 12/15 no ischemia.   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 ([place]) 09/05/2006   Qualifier: Diagnosis of  By: Marca Ancona RMA, Lucy     Diabetic peripheral neuropathy ([place]) 10/29/2013   Encounter for preventative adult health care exam with abnormal findings 09/14/2013   Esophageal reflux    Gastric polyp    Fundic Gland   Gastroparesis    Headache(784.0)    Heart murmur    Echocardiogram 2/11: EF 60-65%, mild LAE, grade 1 diastolic dysfunction, aortic valve sclerosis, mean gradient 9 mm of mercury, PASP 34   Hematuria 03/16/2016   Iron deficiency anemia, unspecified    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   Leg swelling    bilateral   Neck pain 04/22/2015   PONV (postoperative nausea and vomiting)    pt states only needs small amount of anesthesia   PSVT (paroxysmal supraventricular tachycardia) ([place])    Pure  hypercholesterolemia    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   Renal insufficiency 06/18/2019   Stroke ([place])    tia, 2014   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   Type II or unspecified type diabetes mellitus without mention of complication, not stated as uncontrolled    Unspecified essential hypertension    Unspecified hereditary and idiopathic peripheral neuropathy 10/29/2013   Past Surgical History:  Procedure Laterality Date   CHOLECYSTECTOMY  1993   COLONOSCOPY  11/11/2010   diverticulosis   DILATATION & CURRETTAGE/HYSTEROSCOPY WITH RESECTOCOPE N/A 02/25/2013   Procedure: Attempted hysteroscopy with uterine perforation;  Surgeon: Jamey Reas  de Berton Lan, MD;  Location: [facility];  Service: Gynecology;  Laterality: N/A;   ESOPHAGOGASTRODUODENOSCOPY  08/29/2010; 09/15/2010   Carcinoid tumor less than 1 cm in July 2012 not seen in August 2012 , gastritis, fundic gland polyps   ESOPHAGOGASTRODUODENOSCOPY  05/16/2011   ESOPHAGOGASTRODUODENOSCOPY  06/14/2012   EUS  12/15/2010   Procedure: UPPER ENDOSCOPIC ULTRASOUND (EUS) LINEAR;  Surgeon: Owens Loffler, MD;  Location: [facility];  Service: Endoscopy;  Laterality: N/A;   EYE SURGERY Bilateral    Bi lateral cateracts and bi lateral laser   LAPAROSCOPY N/A 02/25/2013   Procedure: Cystoscopy and laparoscopy with fulguration of uterine serosa;  Surgeon: Jamey Reas de Berton Lan, MD;  Location: [facility];  Service: Gynecology;  Laterality: N/A;   TONSILLECTOMY     Social History   Social History Narrative   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   Patient is right-handed.   Patient has a BA degree.   One caffeine drink daily    family history includes Diabetes in her brother, maternal grandmother, mother, sister, and sister; Heart disease in her brother, brother, sister, sister, and sister; Hyperlipidemia in her brother, sister, and sister; Hypertension in her brother, paternal grandmother, sister, and sister; Stroke in  her father.   Review of Systems As per HPI  Objective:   Physical Exam BP (!) 148/62   Pulse 74   Ht 5' 1"  (1.549 m)   Wt 169 lb (76.7 kg)   LMP 02/07/1992   BMI 31.93 kg/m  Well-developed well-nourished elderly woman in no acute distress The abdomen is soft mildly tender in the left lower quadrant but benign overall Total time 34 minutes

## 2021-01-07 NOTE — Patient Instructions (Signed)
If you are age 79 or older, your body mass index should be between 23-30. Your Body mass index is 31.93 kg/m. If this is out of the aforementioned range listed, please consider follow up with your Primary Care Provider.  If you are age 110 or younger, your body mass index should be between 19-25. Your Body mass index is 31.93 kg/m. If this is out of the aformentioned range listed, please consider follow up with your Primary Care Provider.   ________________________________________________________  The [facility] providers would like to encourage you to use [facility] to communicate with providers for non-urgent requests or questions.  Due to long hold times on the telephone, sending your provider a message by [facility] may be a faster and more efficient way to get a response.  Please allow 48 business hours for a response.  Please remember that this is for non-urgent requests.  _______________________________________________________   I appreciate the opportunity to care for you. Silvano Rusk, MD, [facility]

## 2021-01-10 ENCOUNTER — Ambulatory Visit (INDEPENDENT_AMBULATORY_CARE_PROVIDER_SITE_OTHER): Payer: Medicare Other

## 2021-01-10 DIAGNOSIS — E1169 Type 2 diabetes mellitus with other specified complication: Secondary | ICD-10-CM

## 2021-01-10 DIAGNOSIS — I10 Essential (primary) hypertension: Secondary | ICD-10-CM

## 2021-01-10 NOTE — Chronic Care Management (AMB) (Signed)
Chronic Care Management   CCM RN Visit Note  01/10/2021 Name: TAEGAN HAIDER MRN: [id number] DOB: [birth year]  Subjective: Virdia N Sachdev is a 80 y.o. year old female who is a primary care patient of [name], MD. The care management team was consulted for assistance with disease management and care coordination needs.    Engaged with patient by telephone for follow up visit in response to provider referral for case management and/or care coordination services.   Consent to Services:  The patient was given information about Chronic Care Management services, agreed to services, and gave verbal consent prior to initiation of services.  Please see initial visit note for detailed documentation.   Patient agreed to services and verbal consent obtained.   Assessment: Review of patient past medical history, allergies, medications, health status, including review of consultants reports, laboratory and other test data, was performed as part of comprehensive evaluation and provision of chronic care management services.   SDOH (Social Determinants of Health) assessments and interventions performed:    CCM Care Plan  Allergies  Allergen Reactions   Tramadol Other (See Comments)    Dizziness     Outpatient Encounter Medications as of 01/10/2021  Medication Sig   acetaminophen (TYLENOL) 325 MG tablet Take 2 tablets (650 mg total) by mouth every 6 (six) hours as needed for up to 30 doses for mild pain or moderate pain.   amLODipine (NORVASC) 5 MG tablet TAKE 1 TABLET BY MOUTH  DAILY   aspirin EC 81 MG tablet Take 81 mg by mouth daily. Swallow whole.   cholecalciferol (VITAMIN D) 1000 UNITS tablet Take 1,000 Units by mouth daily.   dicyclomine (BENTYL) 10 MG capsule TAKE 1 CAPSULE BY MOUTH 4  TIMES DAILY AS NEEDED   furosemide (LASIX) 20 MG tablet Take 1 tablet (20 mg total) by mouth daily as needed.   losartan (COZAAR) 50 MG tablet TAKE 1 TABLET BY MOUTH  TWICE DAILY   metFORMIN  (GLUCOPHAGE-XR) 500 MG 24 hr tablet TAKE 1 TABLET BY MOUTH IN  THE MORNING AND AT BEDTIME   metoCLOPramide (REGLAN) 5 MG tablet TAKE 1 TABLET BY MOUTH  TWICE DAILY (Patient taking differently: Take 5 mg by mouth 2 (two) times daily as needed.)   metoprolol tartrate (LOPRESSOR) 50 MG tablet TAKE 1 AND 1/2 TABLETS BY  MOUTH TWICE DAILY   NEXIUM 40 MG capsule Take 1 capsule (40 mg total) by mouth daily before breakfast.   ondansetron (ZOFRAN) 4 MG tablet Take 4 mg by mouth every 4 (four) hours as needed. For nausea   potassium chloride (KLOR-CON) 10 MEQ tablet TAKE 1 TABLET BY MOUTH  DAILY   rosuvastatin (CRESTOR) 20 MG tablet TAKE 1 TABLET BY MOUTH  DAILY   thiamine (VITAMIN B-1) 100 MG tablet Take 100 mg by mouth every other day.   betamethasone valerate ointment (VALISONE) 0.1 % Apply qod to affected area, small amount (Patient not taking: Reported on 01/10/2021)   Blood Glucose Monitoring Suppl (ONETOUCH ULTRALINK) w/Device KIT Use to check blood sugars BID E11.69   hydrALAZINE (APRESOLINE) 10 MG tablet Take 10 mg by mouth as needed. (Patient not taking: Reported on 01/10/2021)   Lancets (ONETOUCH ULTRASOFT) lancets USE AS DIRECTED 3 TIMES  DAILY   ONETOUCH ULTRA test strip CHECK BLOOD SUGAR 3 TIMES  DAILY OR AS NEEDED.   No facility-administered encounter medications on file as of 01/10/2021.    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   Renal insufficiency 06/18/2019   Educated about COVID-19 virus infection 05/15/2019   Atrophic vaginitis 01/20/2019   Stenosis of carotid artery 11/12/2018   Anxiety 10/13/2018   Thrush 12/07/2017   Sinusitis 10/11/2017   Headache 07/12/2017   Dizzy spells 11/21/2016   Constipation 11/21/2016   Nonrheumatic aortic  valve stenosis 05/23/2016   Aortic atherosclerosis (HCC) 05/23/2016   Hematuria 03/16/2016   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   Pain of upper abdomen 06/06/2015   Neck pain 04/22/2015   Ear pain 02/28/2015   Abnormal urine 11/21/2014   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   Anemia 06/08/2014   RLS (restless legs syndrome) 11/23/2013   Diabetic peripheral neuropathy ([place]) 10/29/2013   Left-sided thoracic back pain 10/06/2013   Encounter for preventative adult health care exam with abnormal findings 09/14/2013   Iron deficiency anemia    Status post laparoscopy 02/25/2013   Hyponatremia 01/09/2013   GERD (gastroesophageal reflux disease) 01/09/2013   Amaurosis fugax of left eye 10/16/2012   Low back pain 06/03/2012   Vitamin D deficiency 03/11/2012   Bilateral hand pain 10/20/2011   Encounter for long-term (current) use of other medications 10/20/2011   IBS (irritable bowel syndrome) 08/14/2011   TIA (transient ischemic attack) 02/10/2011   Abnormal brain CT 01/19/2011   Allergic rhinitis 10/01/2010   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   FUNDIC GLAND POLYPS OF STOMACH 03/18/2010   Abdominal pain in female 03/18/2010   Pain in joint 18/29/9371   SYSTOLIC MURMUR 69/67/8938   Paroxysmal supraventricular tachycardia ([place]) 01/12/2009   PLANTAR FASCIITIS 06/08/2008   CHEST PAIN 05/18/2008   Gastroparesis 12/18/2007   HYPERCHOLESTEROLEMIA 06/11/2007   Diabetes mellitus type 2 in obese ([place]) 09/05/2006   Essential hypertension 09/05/2006    Conditions to be addressed/monitored:HTN and DMII  Care Plan : RN Care Manager Plan of Care  Updates made by [name], RN since 01/10/2021 12:00 AM     Problem: Chronic Disease Management Education and/or Care Coordination needs   Priority: High     Long-Range Goal: Development of Plan of Care for Chronic Disease Management   Start Date: 01/10/2021  Expected End Date: 04/10/2021  Priority: High  Note:    Current Barriers: Ms. Rowzee reports she was seen and treated for treated with antibiotic for diverticulitis. She also followed up with Gastroenterologist. Ms. Bittick is not reporting any pain at this time. She reports a blood sugar of 58 about 3 weeks ago, treated with eating. She reports she she does not routinely monitor blood sugar Knowledge Deficits related to plan of care for management of HTN and DMII  Limited Social Support Does not adhere to prescribed medication regimen-reports taking medications as prescribed. RNCM Clinical Goal(s):  Patient will take all medications exactly as prescribed and will call provider for medication related questions as evidenced by self report and/or chart notation    demonstrate improved adherence to prescribed treatment plan for HTN and DMII as evidenced by improved daily monitoring and recording of CBG, adherence to ADA/care modified diet, increase activity, improved adherence to prescribed medication regimen, contact provider for new or worsened symptoms or questions.  through collaboration with RN Care manager, provider, and care team.   Interventions: 1:1 collaboration with primary care provider regarding development and update  of comprehensive plan of care as evidenced by provider attestation and co-signature Inter-disciplinary care team collaboration (see longitudinal plan of care) Evaluation of current treatment plan related to  self management and patient's adherence to plan as established by provider  Diabetes Interventions:  (Status:  Goal on track:  Yes.) Long Term Goal Assessed patient's understanding of A1c goal: <7% Reviewed medications with patient and discussed importance of medication adherence Discussed meal plan with patient. With multiple medical conditions, Ms Ulin would like referral to nutritionist. PCP updated  Reviewed treatment of low blood sugar with patient. Reviewed blood sugar goals with patient: FBS 80-130 and after  meal <180. Lab Results  Component Value Date   HGBA1C 6.3 11/01/2020  Hypertension Interventions:  (Status:  Goal on track:  Yes.) Long Term Goal Last practice recorded BP readings:  BP Readings from Last 3 Encounters:  01/07/21 (!) 148/62  12/20/20 (!) 129/55  12/16/20 140/70  Most recent eGFR/CrCl:  Lab Results  Component Value Date   EGFR 75 (L) 07/08/2015    No components found for: CRCL  Evaluation of current treatment plan related to hypertension self management and patient's adherence to plan as established by provider Reviewed medications with patient and discussed importance of compliance Encouraged to begin to check and record blood pressure    Patient Goals/Self-Care Activities: Call provider office for new concerns or questions  manage portion size check blood sugar at prescribed times and check blood sugar if you feel it is too high or too low or during times when your usual meal intake has changed write blood pressure results in a log or diary keep all doctor appointments take the blood sugar log and meter to all doctor visits eat healthy, may need to eat small frequent meals.  contact your RN Care Manager with care management and/or care coordination     Plan:Telephone follow up appointment with care management team member scheduled for:  03/03/21 The patient has been provided with contact information for the care management team and has been advised to call with any health related questions or concerns.   Thea Silversmith, RN, MSN, BSN, CCM Care Management Coordinator [facility] [phone]

## 2021-01-10 NOTE — Patient Instructions (Signed)
Visit Information  Thank you for taking time to visit with me today. Please don't hesitate to contact me if I can be of assistance to you before our next scheduled telephone appointment.  Following are the goals we discussed today:  Patient Goals/Self-Care Activities: Call provider office for new concerns or questions  manage portion size check blood sugar at prescribed times and check blood sugar if you feel it is too high or too low or during times when your usual meal intake has changed write blood pressure results in a log or diary keep all doctor appointments take the blood sugar log and meter to all doctor visits eat healthy, may need to eat small frequent meals.  contact your RN Care Manager with care management and/or care coordination   Our next appointment is by telephone on 03/03/20 at 2:00 pm  Please call the care guide team at [phone] if you need to cancel or reschedule your appointment.   If you are experiencing a Mental Health or [place] or need someone to talk to, please call the Suicide and Crisis Lifeline: 988 call 1-800-273-TALK (toll free, 24 hour hotline)   Patient verbalizes understanding of instructions provided today and agrees to view in [place].   Thea Silversmith, RN, MSN, BSN, CCM Care Management Coordinator [facility] [phone]

## 2021-01-13 ENCOUNTER — Other Ambulatory Visit: Payer: Self-pay | Admitting: Internal Medicine

## 2021-01-13 ENCOUNTER — Other Ambulatory Visit: Payer: Self-pay | Admitting: Family Medicine

## 2021-01-18 ENCOUNTER — Other Ambulatory Visit (HOSPITAL_BASED_OUTPATIENT_CLINIC_OR_DEPARTMENT_OTHER): Payer: Self-pay

## 2021-01-18 ENCOUNTER — Encounter: Payer: Self-pay | Admitting: Family

## 2021-01-18 MED ORDER — CARESTART COVID-19 HOME TEST VI KIT
PACK | 0 refills | Status: DC
  Filled 2021-01-18: qty 2, 4d supply, fill #0

## 2021-01-19 ENCOUNTER — Other Ambulatory Visit: Payer: Medicare Other

## 2021-01-24 ENCOUNTER — Other Ambulatory Visit: Payer: Self-pay | Admitting: Family Medicine

## 2021-01-24 ENCOUNTER — Other Ambulatory Visit: Payer: Medicare Other

## 2021-01-25 ENCOUNTER — Ambulatory Visit: Payer: Medicare Other | Admitting: Family Medicine

## 2021-01-31 ENCOUNTER — Other Ambulatory Visit: Payer: Self-pay | Admitting: Family Medicine

## 2021-02-05 DIAGNOSIS — I10 Essential (primary) hypertension: Secondary | ICD-10-CM | POA: Diagnosis not present

## 2021-02-05 DIAGNOSIS — E669 Obesity, unspecified: Secondary | ICD-10-CM | POA: Diagnosis not present

## 2021-02-05 DIAGNOSIS — E1169 Type 2 diabetes mellitus with other specified complication: Secondary | ICD-10-CM | POA: Diagnosis not present

## 2021-02-10 ENCOUNTER — Ambulatory Visit: Payer: Medicare Other | Admitting: Neurology

## 2021-02-15 ENCOUNTER — Encounter: Payer: Self-pay | Admitting: Family Medicine

## 2021-02-15 ENCOUNTER — Ambulatory Visit (INDEPENDENT_AMBULATORY_CARE_PROVIDER_SITE_OTHER): Payer: Medicare Other | Admitting: Family Medicine

## 2021-02-15 VITALS — BP 137/62 | HR 65 | Temp 97.7°F | Ht 61.0 in | Wt 169.5 lb

## 2021-02-15 DIAGNOSIS — J029 Acute pharyngitis, unspecified: Secondary | ICD-10-CM | POA: Diagnosis not present

## 2021-02-15 LAB — POCT RAPID STREP A (OFFICE): Rapid Strep A Screen: NEGATIVE

## 2021-02-15 NOTE — Progress Notes (Addendum)
Chief Complaint  Patient presents with   Sore Throat    [name] here for URI complaints.  Duration: 5 days, getting worse Associated symptoms: sore throat Denies: sinus congestion, sinus pain, rhinorrhea, itchy watery eyes, ear pain, ear drainage, wheezing, shortness of breath, myalgia, and fevers, coughing, N/V/D Treatment to date: salt water gargles No recent abx use.  Sick contacts: No  Past Medical History:  Diagnosis Date   Abdominal pain in female 03/18/2010   Qualifier: Diagnosis of  By: [name], [name]    Anemia 06/08/2014   Anxiety    Arthritis    Spinal Osteoarthritis   Cancer ([place])    Carcinoid tumor of stomach    Cataract    Chest pain    Myoview 12/15 no ischemia.   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 ([place]) 09/05/2006   Qualifier: Diagnosis of  By: [name], [name]     Diabetic peripheral neuropathy (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   Leg swelling    bilateral   Neck pain 04/22/2015   PONV (postoperative nausea and vomiting)    pt states only needs small amount of anesthesia   PSVT (paroxysmal supraventricular tachycardia) ([name])    Pure hypercholesterolemia    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   Renal insufficiency 06/18/2019   Stroke ([name])    tia, 2014   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   Type II or unspecified type diabetes mellitus without mention of complication, not stated as uncontrolled    Unspecified essential hypertension    Unspecified hereditary and idiopathic  peripheral neuropathy 10/29/2013    Objective BP 137/62    Pulse 65    Temp 97.7 F (36.5 C) (Oral)    Ht 5\' 1"  (1.549 m)    Wt 169 lb 8 oz (76.9 kg)    LMP 02/07/1992    SpO2 97%    BMI 32.03 kg/m  General: Awake, alert, appears stated age HEENT: AT, [place], ears patent b/l and TM's neg, nares patent w/o discharge, pharynx with small patch of erythema and without exudates, no asymmetry, MMM Neck: No masses or asymmetry Heart: RRR Lungs: CTAB, no accessory muscle use Psych: Age appropriate judgment and insight, normal mood and affect  Sore throat - Plan: POCT rapid strep A  Tylenol, salt water gargles, throat lozenges. Continue to push fluids, practice good hand hygiene. Offered depo injection, politely declined. She is very adamant she needs blood work or further testing as this has been going on for 5 days already. I told her that this is likely a lingering viral infection requiring no further workup, send message if no better in a couple days. We can do an injection at that point vs empirically tx for strep. Would need to culture prior to starting this.  F/u prn. If starting to experience fevers, shaking, or shortness of breath, seek immediate care. Pt voiced understanding and reluctantly agreed to the plan.  I spent 30 minutes with the  patient discussing the above plan and reviewing her chart on the same day of the visit.   [place], DO 02/15/21 4:40 PM

## 2021-02-15 NOTE — Patient Instructions (Addendum)
OK to take Tylenol 1000 mg (2 extra strength tabs) or 975 mg (3 regular strength tabs) every 6 hours as needed.  Consider throat lozenges (like Cepacol), salt water gargles and an air humidifier for symptomatic care.   Continue to push fluids, practice good hand hygiene, and cover your mouth if you cough.  If you start having fevers, shaking or shortness of breath, seek immediate care.  Send me a message if you don't turn the corner.   Let us know if you need anything.

## 2021-02-17 ENCOUNTER — Telehealth: Payer: Self-pay | Admitting: *Deleted

## 2021-02-17 NOTE — Telephone Encounter (Signed)
On 02/15/21 I called patient to see if she would like to change to a virtual visit because the provider that she was seeing that day.  Patient was very upset because she was unable to come into office to be seen.  She yelled and stated that she had been sick since Friday.  I advised her that she can go to Urgent Care, she yelled and stated that she does not know what an urgent care is.  I put her on hold and ask supervisor what to do.  She stated to see if Dr. Nani Ravens would see her.  Dr. Nani Ravens agreed to see her that day.

## 2021-02-24 ENCOUNTER — Telehealth: Payer: Self-pay | Admitting: Family Medicine

## 2021-02-24 NOTE — Telephone Encounter (Signed)
Spoke with pt she wanted to know if she had strep or not. She is aware of the neg strep test

## 2021-02-24 NOTE — Telephone Encounter (Signed)
Pt called in and wants to discuss last test results that was done. She has some questions and doesn't know what to do. She didn't really go into details. The last test I see was strep, and labs back in 11/22.

## 2021-02-28 ENCOUNTER — Other Ambulatory Visit: Payer: Medicare Other

## 2021-03-01 ENCOUNTER — Ambulatory Visit: Payer: Medicare Other | Admitting: Neurology

## 2021-03-01 ENCOUNTER — Other Ambulatory Visit (INDEPENDENT_AMBULATORY_CARE_PROVIDER_SITE_OTHER): Payer: Medicare Other

## 2021-03-01 DIAGNOSIS — I10 Essential (primary) hypertension: Secondary | ICD-10-CM

## 2021-03-01 DIAGNOSIS — E669 Obesity, unspecified: Secondary | ICD-10-CM | POA: Diagnosis not present

## 2021-03-01 DIAGNOSIS — E559 Vitamin D deficiency, unspecified: Secondary | ICD-10-CM

## 2021-03-01 DIAGNOSIS — E519 Thiamine deficiency, unspecified: Secondary | ICD-10-CM

## 2021-03-01 DIAGNOSIS — E1169 Type 2 diabetes mellitus with other specified complication: Secondary | ICD-10-CM

## 2021-03-01 DIAGNOSIS — D500 Iron deficiency anemia secondary to blood loss (chronic): Secondary | ICD-10-CM | POA: Diagnosis not present

## 2021-03-01 LAB — CBC
HCT: 38.8 % (ref 36.0–46.0)
Hemoglobin: 12.7 g/dL (ref 12.0–15.0)
MCHC: 32.7 g/dL (ref 30.0–36.0)
MCV: 86.3 fl (ref 78.0–100.0)
Platelets: 206 10*3/uL (ref 150.0–400.0)
RBC: 4.5 Mil/uL (ref 3.87–5.11)
RDW: 12.7 % (ref 11.5–15.5)
WBC: 6.8 10*3/uL (ref 4.0–10.5)

## 2021-03-01 LAB — COMPREHENSIVE METABOLIC PANEL
ALT: 18 U/L (ref 0–35)
AST: 19 U/L (ref 0–37)
Albumin: 4.5 g/dL (ref 3.5–5.2)
Alkaline Phosphatase: 55 U/L (ref 39–117)
BUN: 22 mg/dL (ref 6–23)
CO2: 28 mEq/L (ref 19–32)
Calcium: 9.9 mg/dL (ref 8.4–10.5)
Chloride: 98 mEq/L (ref 96–112)
Creatinine, Ser: 0.75 mg/dL (ref 0.40–1.20)
GFR: 75.46 mL/min (ref >60.00)
Glucose, Bld: 131 mg/dL — ABNORMAL HIGH (ref 70–99)
Potassium: 3.9 mEq/L (ref 3.5–5.1)
Sodium: 135 mEq/L (ref 135–145)
Total Bilirubin: 0.7 mg/dL (ref 0.2–1.2)
Total Protein: 7 g/dL (ref 6.0–8.3)

## 2021-03-01 LAB — LIPID PANEL
Cholesterol: 205 mg/dL — ABNORMAL HIGH (ref 0–200)
HDL: 63.8 mg/dL (ref >39.00)
LDL Cholesterol: 112 mg/dL — ABNORMAL HIGH (ref 0–99)
NonHDL: 141.07
Total CHOL/HDL Ratio: 3
Triglycerides: 145 mg/dL (ref 0.0–149.0)
VLDL: 29 mg/dL (ref 0.0–40.0)

## 2021-03-01 LAB — VITAMIN D 25 HYDROXY (VIT D DEFICIENCY, FRACTURES): VITD: 48.67 ng/mL (ref 30.00–100.00)

## 2021-03-01 LAB — HEMOGLOBIN A1C: Hgb A1c MFr Bld: 6.2 % (ref 4.6–6.5)

## 2021-03-01 LAB — IRON,TIBC AND FERRITIN PANEL
%SAT: 31 % (calc) (ref 16–45)
Ferritin: 334 ng/mL — ABNORMAL HIGH (ref 16–288)
Iron: 99 ug/dL (ref 45–160)
TIBC: 317 mcg/dL (calc) (ref 250–450)

## 2021-03-01 LAB — TSH: TSH: 2.8 u[IU]/mL (ref 0.35–5.50)

## 2021-03-02 ENCOUNTER — Other Ambulatory Visit: Payer: Self-pay

## 2021-03-02 ENCOUNTER — Ambulatory Visit: Payer: Medicare Other | Admitting: Neurology

## 2021-03-02 ENCOUNTER — Encounter: Payer: Self-pay | Admitting: Family

## 2021-03-02 VITALS — BP 144/80 | HR 70 | Ht 62.0 in | Wt 166.0 lb

## 2021-03-02 DIAGNOSIS — Z8673 Personal history of transient ischemic attack (TIA), and cerebral infarction without residual deficits: Secondary | ICD-10-CM | POA: Diagnosis not present

## 2021-03-02 DIAGNOSIS — R42 Dizziness and giddiness: Secondary | ICD-10-CM | POA: Diagnosis not present

## 2021-03-02 DIAGNOSIS — R200 Anesthesia of skin: Secondary | ICD-10-CM

## 2021-03-02 MED ORDER — TOPIRAMATE 25 MG PO TABS: 25.0000 mg | ORAL_TABLET | Freq: Two times a day (BID) | ORAL | 3 refills | Status: DC

## 2021-03-02 MED ORDER — TOPIRAMATE 25 MG PO TABS
25.0000 mg | ORAL_TABLET | Freq: Two times a day (BID) | ORAL | 3 refills | Status: DC
Start: 2021-03-02 — End: 2021-07-05

## 2021-03-02 NOTE — Patient Instructions (Signed)
I had a long discussion with the patient with regards to her remote history of stroke and poststroke paresthesias as well as discussed results of MRI scan of the brain and cervical spine and answered questions.  I recommend she stay on aspirin for stroke prevention and maintain aggressive risk factor modification strict control of hypertension with blood pressure goal below 130/90, lipids with LDL cholesterol goal below 70 mg percent and diabetes with hemoglobin A1c goal below 6.5%.  I recommend a trial of Topamax 25 mg twice daily to help with her left face and arm paresthesias.  I also advised her to do regular neck stretching exercises.  She will return follow-up in the future in 3 months or call earlier if necessary. Neck Exercises Ask your health care provider which exercises are safe for you. Do exercises exactly as told by your health care provider and adjust them as directed. It is normal to feel mild stretching, pulling, tightness, or discomfort as you do these exercises. Stop right away if you feel sudden pain or your pain gets worse. Do not begin these exercises until told by your health care provider. Neck exercises can be important for many reasons. They can improve strength and maintain flexibility in your neck, which will help your upper back and prevent neck pain. Stretching exercises Rotation neck stretching  Sit in a chair or stand up. Place your feet flat on the floor, shoulder-width apart. Slowly turn your head (rotate) to the right until a slight stretch is felt. Turn it all the way to the right so you can look over your right shoulder. Do not tilt or tip your head. Hold this position for 10-30 seconds. Slowly turn your head (rotate) to the left until a slight stretch is felt. Turn it all the way to the left so you can look over your left shoulder. Do not tilt or tip your head. Hold this position for 10-30 seconds. Repeat __________ times. Complete this exercise __________ times a  day. Neck retraction  Sit in a sturdy chair or stand up. Look straight ahead. Do not bend your neck. Use your fingers to push your chin backward (retraction). Do not bend your neck for this movement. Continue to face straight ahead. If you are doing the exercise properly, you will feel a slight sensation in your throat and a stretch at the back of your neck. Hold the stretch for 1-2 seconds. Repeat __________ times. Complete this exercise __________ times a day. Strengthening exercises Neck press  Lie on your back on a firm bed or on the floor with a pillow under your head. Use your neck muscles to push your head down on the pillow and straighten your spine. Hold the position as well as you can. Keep your head facing up (in a neutral position) and your chin tucked. Slowly count to 5 while holding this position. Repeat __________ times. Complete this exercise __________ times a day. Isometrics These are exercises in which you strengthen the muscles in your neck while keeping your neck still (isometrics). Sit in a supportive chair and place your hand on your forehead. Keep your head and face facing straight ahead. Do not flex or extend your neck while doing isometrics. Push forward with your head and neck while pushing back with your hand. Hold for 10 seconds. Do the sequence again, this time putting your hand against the back of your head. Use your head and neck to push backward against the hand pressure. Finally, do the same exercise  on either side of your head, pushing sideways against the pressure of your hand. Repeat __________ times. Complete this exercise __________ times a day. Prone head lifts  Lie face-down (prone position), resting on your elbows so that your chest and upper back are raised. Start with your head facing downward, near your chest. Position your chin either on or near your chest. Slowly lift your head upward. Lift until you are looking straight ahead. Then continue  lifting your head as far back as you can comfortably stretch. Hold your head up for 5 seconds. Then slowly lower it to your starting position. Repeat __________ times. Complete this exercise __________ times a day. Supine head lifts  Lie on your back (supine position), bending your knees to point to the ceiling and keeping your feet flat on the floor. Lift your head slowly off the floor, raising your chin toward your chest. Hold for 5 seconds. Repeat __________ times. Complete this exercise __________ times a day. Scapular retraction  Stand with your arms at your sides. Look straight ahead. Slowly pull both shoulders (scapulae) backward and downward (retraction) until you feel a stretch between your shoulder blades in your upper back. Hold for 10-30 seconds. Relax and repeat. Repeat __________ times. Complete this exercise __________ times a day. Contact a health care provider if: Your neck pain or discomfort gets worse when you do an exercise. Your neck pain or discomfort does not improve within 2 hours after you exercise. If you have any of these problems, stop exercising right away. Do not do the exercises again unless your health care provider says that you can. Get help right away if: You develop sudden, severe neck pain. If this happens, stop exercising right away. Do not do the exercises again unless your health care provider says that you can. This information is not intended to replace advice given to you by your health care provider. Make sure you discuss any questions you have with your health care provider. Document Revised: 07/20/2020 Document Reviewed: 07/20/2020 Elsevier Patient Education  [place].

## 2021-03-02 NOTE — Progress Notes (Signed)
[facility] [address] [place] [address] [phone]       OFFICE CONSULT NOTE  [name] Date of Birth:  [birth year] Medical Record Number:  [id number]   Referring MD: Willette Alma  Reason for Referral: Abnormal MRI  HPI: [name] is a 81 year old lady seen today for initial office consultation visit.  She has past medical history of diabetes, hypertension, hyperlipidemia, SVT, anxiety, carcinoid stomach tumor, chronic kidney disease and right subcortical infarct in 2006 with left-sided weakness and numbness which gradually improved over a few years.  She was seen by Dr. Erling Cruz in 2012 for left face and hand paresthesias and dizziness.  It is unclear if these were poststroke paresthesias from a thalamic infarct related to cervical radiculopathy.  Brain imaging at that time had shown supratentorial dilated CSF spaces and partially empty sella and MRI had shown mild intracranial atherosclerotic changes only.  Patient is also subsequently also seen Dr. Susa Raring and was diagnosed with possible cervical radiculopathy and MRI of the cervical spine was ordered which was done on 08/30/2020 which showed severe left-sided foraminal narrowing at C3/4 and moderate left-sided foraminal narrowing at C4-5.  EMG was suggested but apparently has not been done.  Review of her electronic medical records s show that she had EMG nerve conduction study done on 12/24/2007 by Dr. Jannifer Franklin which had shown chronic right S1 radiculopathy.  MRI scan of the brain on 07/20/2020 showed bilateral supratentorial dilated CSF spaces along with partially empty sella and enlarged Meckel's cave finding essentially unchanged compared to previous MRI.  There is also dilated CSF space in the right cerebellum which was called an old infarct.  Patient's denies at present any symptoms of recent stroke or TIA in the form of focal extremity weakness, numbness, gait difficulties, vertigo double vision or  blurred vision.  She has chronic symptoms of dizziness which she says are intermittent but nowadays are present most of the time.  She describes this as a feeling of lightheadedness and dizziness and imbalance.  She denies true vertigo.  She also complains of numbness on the top of her head and on the left at times it goes into her neck.  She denies classical radicular symptoms.  She denies weakness or tingling or numbness in her hands or feet.  She is currently on aspirin which is tolerating well without bruising or bleeding.  Her blood pressure is under good control usually though today it is borderline at 144/80.  She states her diabetes is under good control and she is tolerating Crestor well without muscle aches and pains.  She has tried gabapentin which was prescribed by Dr. Tomi Likens for paresthesias and numbness but she did not like the way it made her feel and she stopped it.  She also got a prescription for Cymbalta which she has not started yet.  ROS:   14 system review of systems is positive for dizziness, numbness, head pressure all other systems negative  PMH:  Past Medical History:  Diagnosis Date   Abdominal pain in female 03/18/2010   Qualifier: Diagnosis of  By: Carlean Purl MD, Tonna Boehringer E    Anemia 06/08/2014   Anxiety    Arthritis    Spinal Osteoarthritis   Cancer ([place])    Carcinoid tumor of stomach    Cataract    Chest pain    Myoview 12/15 no ischemia.   Chronic kidney disease    Left kidney smaller than right kidney   Constipation 11/21/2016  Diabetes mellitus type 2 in obese ([place]) 09/05/2006   Qualifier: Diagnosis of  By: Marca Ancona RMA, Lucy     Diabetic peripheral neuropathy ([place]) 10/29/2013   Encounter for preventative adult health care exam with abnormal findings 09/14/2013   Esophageal reflux    Gastric polyp    Fundic Gland   Gastroparesis    Headache(784.0)    Heart murmur    Echocardiogram 2/11: EF 60-65%, mild LAE, grade 1 diastolic dysfunction, aortic valve sclerosis,  mean gradient 9 mm of mercury, PASP 34   Hematuria 03/16/2016   Iron deficiency anemia, unspecified    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   Type II or unspecified type diabetes mellitus without mention of complication, not stated as uncontrolled    Unspecified essential hypertension    Unspecified hereditary and idiopathic peripheral neuropathy 10/29/2013    Social History:  Social History   Socioeconomic History   Marital status: Widowed    Spouse name: Not on file   Number of children: 0   Years of education: college   Highest education level: Not on file  Occupational History   Occupation: retired  Tobacco Use   Smoking status: Never   Smokeless tobacco: Never   Tobacco comments:    Never used tobacco  Vaping Use   Vaping Use: Never used  Substance and Sexual Activity   Alcohol use: No    Alcohol/week: 0.0 standard drinks   Drug use: No   Sexual activity: Never    Partners: Male    Birth control/protection: Post-menopausal    Comment: lives alone, no dietary restrictions except avoid fresh veg, fruit, whole grains  Other Topics Concern   Not on file  Social History Narrative   Patient was married ([name]) - widow   Patient does not have any children.   Patient is right-handed.   Patient has a BA degree.   One caffeine drink daily    Social Determinants of Health   Financial Resource Strain: Low Risk    Difficulty of Paying Living Expenses: Not hard at all  Food Insecurity: No Food Insecurity   Worried About Charity fundraiser in the Last Year: Never true   Ran Out of Food in the Last Year: Never true  Transportation Needs: No  Transportation Needs   Lack of Transportation (Medical): No   Lack of Transportation (Non-Medical): No  Physical Activity: Not on file  Stress: Not on file  Social Connections: Not on file  Intimate Partner Violence: Not on file    Medications:   Current Outpatient Medications on File Prior to Visit  Medication Sig Dispense Refill   acetaminophen (TYLENOL) 325 MG tablet Take 2 tablets (650 mg total) by mouth every 6 (six) hours as needed for up to 30 doses for mild pain or moderate pain. 30 tablet 0   amLODipine (NORVASC) 5 MG tablet TAKE 1 TABLET BY MOUTH  DAILY 90 tablet 3   aspirin EC 81 MG tablet Take 81 mg by mouth daily. Swallow whole.     betamethasone valerate ointment (VALISONE) 0.1 % Apply qod to affected area, small amount 45  g 1   Blood Glucose Monitoring Suppl (ONE TOUCH ULTRA 2) w/Device KIT USE TO CHECK BLOOD SUGAR AS DIRECTED 1 kit 0   cholecalciferol (VITAMIN D) 1000 UNITS tablet Take 1,000 Units by mouth daily.     COVID-19 At Home Antigen Test (CARESTART COVID-19 HOME TEST) KIT Use as directed 2 kit 0   dicyclomine (BENTYL) 10 MG capsule TAKE 1 CAPSULE BY MOUTH 4  TIMES DAILY AS NEEDED 240 capsule 0   furosemide (LASIX) 20 MG tablet TAKE 1 TABLET BY MOUTH  DAILY AS NEEDED 90 tablet 3   hydrALAZINE (APRESOLINE) 10 MG tablet Take 10 mg by mouth as needed.     Lancets (ONETOUCH ULTRASOFT) lancets USE AS DIRECTED 3 TIMES  DAILY 300 each 3   losartan (COZAAR) 50 MG tablet TAKE 1 TABLET BY MOUTH  TWICE DAILY 180 tablet 1   metFORMIN (GLUCOPHAGE-XR) 500 MG 24 hr tablet TAKE 1 TABLET BY MOUTH IN  THE MORNING AND AT BEDTIME 180 tablet 3   metoCLOPramide (REGLAN) 5 MG tablet Take 1 tablet (5 mg total) by mouth 2 (two) times daily as needed. 180 tablet 0   metoprolol tartrate (LOPRESSOR) 50 MG tablet TAKE 1 AND 1/2 TABLETS BY  MOUTH TWICE DAILY 270 tablet 3   NEXIUM 40 MG capsule Take 1 capsule (40 mg total) by mouth daily before breakfast. 90 capsule 3   ondansetron (ZOFRAN) 4 MG  tablet Take 4 mg by mouth every 4 (four) hours as needed. For nausea     ONETOUCH ULTRA test strip CHECK BLOOD SUGAR 3 TIMES  DAILY OR AS NEEDED. 300 strip 3   potassium chloride (KLOR-CON) 10 MEQ tablet TAKE 1 TABLET BY MOUTH  DAILY 90 tablet 3   rosuvastatin (CRESTOR) 20 MG tablet TAKE 1 TABLET BY MOUTH  DAILY 90 tablet 3   thiamine (VITAMIN B-1) 100 MG tablet Take 100 mg by mouth every other day.     No current facility-administered medications on file prior to visit.    Allergies:   Allergies  Allergen Reactions   Tramadol Other (See Comments)    Dizziness     Physical Exam General: well developed, well nourished elderly lady, seated, in no evident distress Head: head normocephalic and atraumatic.   Neck: supple with no carotid or supraclavicular bruits Cardiovascular: regular rate and rhythm, no murmurs Musculoskeletal: no deformity.  Mild tenderness of the left occipital groove but negative Tinel's sign Skin:  no rash/petichiae Vascular:  Normal pulses all extremities  Neurologic Exam Mental Status: Awake and fully alert. Oriented to place and time. Recent and remote memory intact. Attention span, concentration and fund of knowledge appropriate. Mood and affect appropriate.  Cranial Nerves: Fundoscopic exam reveals sharp disc margins. Pupils equal, briskly reactive to light. Extraocular movements full without nystagmus. Visual fields full to confrontation. Hearing intact. Facial sensation intact. Face, tongue, palate moves normally and symmetrically.  Motor: Normal bulk and tone. Normal strength in all tested extremity muscles. Sensory.: intact to touch , pinprick , position and vibratory sensation.  Coordination: Rapid alternating movements normal in all extremities. Finger-to-nose and heel-to-shin performed accurately bilaterally. Gait and Station: Arises from chair without difficulty. Stance is normal. Gait demonstrates normal stride length and balance . Able to heel, toe and  tandem walk with moderate difficulty.  Reflexes: 1+ and symmetric. Toes downgoing.   NIHSS  0 Modified Rankin  1   ASSESSMENT: 81 year old lady with chronic intermittent dizziness and left face and arm paresthesias of unclear etiology possibly  poststroke paresthesias versus combination of cervical radiculopathy from degenerative cervical spine disease.  Remote history of right brain subcortical infarct in 2006 and vascular risk factors of diabetes, hypertension, hyperlipidemia and cerebrovascular disease.  Abnormal MRI scan of the brain showing dilated supratentorial CSF spaces and partially empty sella which are likely longstanding and benign entities.  Dilated CSF space has been erroneously called as a cerebellar infarct and she has no clinical symptomatology to go with it.     PLAN:I had a long discussion with the patient with regards to her remote history of stroke and poststroke paresthesias as well as discussed results of MRI scan of the brain and cervical spine and answered questions.  I recommend she stay on aspirin for stroke prevention and maintain aggressive risk factor modification strict control of hypertension with blood pressure goal below 130/90, lipids with LDL cholesterol goal below 70 mg percent and diabetes with hemoglobin A1c goal below 6.5%.  I recommend a trial of Topamax 25 mg twice daily to help with her left face and arm paresthesias.  I also advised her to do regular neck stretching exercises.  She will return follow-up in the future in 3 months or call earlier if necessary.  Greater than 50% time during this 45-minute consultation visit were spent on counseling and coordination of care about her chronic symptoms of dizziness, paresthesias, remote stroke and answering questions. Antony Contras, MD  Note: This document was prepared with digital dictation and possible smart phrase technology. Any transcriptional errors that result from this process are unintentional.

## 2021-03-03 ENCOUNTER — Ambulatory Visit (INDEPENDENT_AMBULATORY_CARE_PROVIDER_SITE_OTHER): Payer: Medicare Other

## 2021-03-03 ENCOUNTER — Telehealth: Payer: Self-pay

## 2021-03-03 ENCOUNTER — Other Ambulatory Visit: Payer: Self-pay | Admitting: Family Medicine

## 2021-03-03 DIAGNOSIS — I10 Essential (primary) hypertension: Secondary | ICD-10-CM

## 2021-03-03 DIAGNOSIS — E7800 Pure hypercholesterolemia, unspecified: Secondary | ICD-10-CM

## 2021-03-03 DIAGNOSIS — K3184 Gastroparesis: Secondary | ICD-10-CM

## 2021-03-03 DIAGNOSIS — K219 Gastro-esophageal reflux disease without esophagitis: Secondary | ICD-10-CM

## 2021-03-03 DIAGNOSIS — E1169 Type 2 diabetes mellitus with other specified complication: Secondary | ICD-10-CM

## 2021-03-03 NOTE — Patient Instructions (Addendum)
Visit Information  Thank you for taking time to visit with me today. Please don't hesitate to contact me if I can be of assistance to you before our next scheduled telephone appointment.  Following are the goals we discussed today:  Patient Goals/Self-Care Activities: Call provider office for new concerns or questions  eat healthy, may need to eat small frequent meals, monitor portion sizes write blood pressure results in a log or diary keep all doctor appointments check blood sugar at prescribed times and check blood sugar if you feel it is too high or too low or during times when your usual meal intake has changed Goals for blood sugar readings: Fasting 80-130 and after a meal <180. contact your [place] with care management and/or care coordination needs Review education provided on lowering cholesterol and IBS diet  Our next appointment is by telephone on 03/29/21 at 10:45 am  Please call the care guide team at [phone] if you need to cancel or reschedule your appointment.   If you are experiencing a Mental Health or [place] or need someone to talk to, please call the Suicide and Crisis Lifeline: 988 call the [place] National Suicide Prevention Lifeline: 317-421-6468 or TTY: 647-615-9665 TTY 667-043-6070) to talk to a trained counselor   The patient verbalized understanding of instructions, educational materials, and care plan provided today and agreed to receive a mailed copy of patient instructions, educational materials, and care plan.   Thea Silversmith, RN, MSN, BSN, CCM Care Management Coordinator [facility] [phone]   Cholesterol Content in Foods Cholesterol is a waxy, fat-like substance that helps to carry fat in the blood. The body needs cholesterol in small amounts, but too much cholesterol can cause damage to the arteries and heart. What foods have cholesterol? Cholesterol is found in animal-based foods, such as meat, seafood,  and dairy. Generally, low-fat dairy and lean meats have less cholesterol than full-fat dairy and fatty meats. The milligrams of cholesterol per serving (mg per serving) of common cholesterol-containing foods are listed below. Meats and other proteins Egg -- one large whole egg has 186 mg. Veal shank -- 4 oz (113 g) has 141 mg. Lean ground [place] (93% lean) -- 4 oz (113 g) has 118 mg. Fat-trimmed lamb loin -- 4 oz (113 g) has 106 mg. Lean ground beef (90% lean) -- 4 oz (113 g) has 100 mg. Lobster -- 3.5 oz (99 g) has 90 mg. Pork loin chops -- 4 oz (113 g) has 86 mg. Canned salmon -- 3.5 oz (99 g) has 83 mg. Fat-trimmed beef top loin -- 4 oz (113 g) has 78 mg. Frankfurter -- 1 frank (3.5 oz or 99 g) has 77 mg. Crab -- 3.5 oz (99 g) has 71 mg. Roasted chicken without skin, white meat -- 4 oz (113 g) has 66 mg. Light bologna -- 2 oz (57 g) has 45 mg. Deli-cut [place] -- 2 oz (57 g) has 31 mg. Canned tuna -- 3.5 oz (99 g) has 31 mg. Berniece Salines -- 1 oz (28 g) has 29 mg. Oysters and mussels (raw) -- 3.5 oz (99 g) has 25 mg. Mackerel -- 1 oz (28 g) has 22 mg. Trout -- 1 oz (28 g) has 20 mg. Pork sausage -- 1 link (1 oz or 28 g) has 17 mg. Salmon -- 1 oz (28 g) has 16 mg. Tilapia -- 1 oz (28 g) has 14 mg. Dairy Soft-serve ice cream --  cup (4 oz or 86 g)  has 103 mg. Whole-milk yogurt -- 1 cup (8 oz or 245 g) has 29 mg. Cheddar cheese -- 1 oz (28 g) has 28 mg. American cheese -- 1 oz (28 g) has 28 mg. Whole milk -- 1 cup (8 oz or 250 mL) has 23 mg. 2% milk -- 1 cup (8 oz or 250 mL) has 18 mg. Cream cheese -- 1 tablespoon (Tbsp) (14.5 g) has 15 mg. Cottage cheese --  cup (4 oz or 113 g) has 14 mg. Low-fat (1%) milk -- 1 cup (8 oz or 250 mL) has 10 mg. Sour cream -- 1 Tbsp (12 g) has 8.5 mg. Low-fat yogurt -- 1 cup (8 oz or 245 g) has 8 mg. Nonfat Greek yogurt -- 1 cup (8 oz or 228 g) has 7 mg. Half-and-half cream -- 1 Tbsp (15 mL) has 5 mg. Fats and oils Cod liver oil -- 1 tablespoon (Tbsp)  (13.6 g) has 82 mg. Butter -- 1 Tbsp (14 g) has 15 mg. Lard -- 1 Tbsp (12.8 g) has 14 mg. Bacon grease -- 1 Tbsp (12.9 g) has 14 mg. Mayonnaise -- 1 Tbsp (13.8 g) has 5-10 mg. Margarine -- 1 Tbsp (14 g) has 3-10 mg. The items listed above may not be a complete list of foods with cholesterol. Exact amounts of cholesterol in these foods may vary depending on specific ingredients and brands. Contact a dietitian for more information. What foods do not have cholesterol? Most plant-based foods do not have cholesterol unless you combine them with a food that has cholesterol. Foods without cholesterol include: Grains and cereals. Vegetables. Fruits. Vegetable oils, such as olive, canola, and sunflower oil. Legumes, such as peas, beans, and lentils. Nuts and seeds. Egg whites. The items listed above may not be a complete list of foods that do not have cholesterol. Contact a dietitian for more information. Summary The body needs cholesterol in small amounts, but too much cholesterol can cause damage to the arteries and heart. Cholesterol is found in animal-based foods, such as meat, seafood, and dairy. Generally, low-fat dairy and lean meats have less cholesterol than full-fat dairy and fatty meats. This information is not intended to replace advice given to you by your health care provider. Make sure you discuss any questions you have with your health care provider. Document Revised: 06/04/2020 Document Reviewed: 06/04/2020 Elsevier Patient Education  [place].

## 2021-03-03 NOTE — Chronic Care Management (AMB) (Signed)
Chronic Care Management   CCM RN Visit Note  03/03/2021 Name: TAQUANA BARTLEY MRN: [id number] DOB: 02/24/1940  Subjective: Shontay N Hokenson is a 81 y.o. year old female who is a primary care patient of [name], MD. The care management team was consulted for assistance with disease management and care coordination needs.    Engaged with patient by telephone for follow up visit in response to provider referral for case management and/or care coordination services.   Consent to Services:  The patient was given information about Chronic Care Management services, agreed to services, and gave verbal consent prior to initiation of services.  Please see initial visit note for detailed documentation.   Patient agreed to services and verbal consent obtained.   Assessment: Review of patient past medical history, allergies, medications, health status, including review of consultants reports, laboratory and other test data, was performed as part of comprehensive evaluation and provision of chronic care management services.   SDOH (Social Determinants of Health) assessments and interventions performed:    CCM Care Plan  Allergies  Allergen Reactions   Tramadol Other (See Comments)    Dizziness     Outpatient Encounter Medications as of 03/03/2021  Medication Sig   acetaminophen (TYLENOL) 325 MG tablet Take 2 tablets (650 mg total) by mouth every 6 (six) hours as needed for up to 30 doses for mild pain or moderate pain.   amLODipine (NORVASC) 5 MG tablet TAKE 1 TABLET BY MOUTH  DAILY   aspirin EC 81 MG tablet Take 81 mg by mouth daily. Swallow whole.   betamethasone valerate ointment (VALISONE) 0.1 % Apply qod to affected area, small amount   cholecalciferol (VITAMIN D) 1000 UNITS tablet Take 1,000 Units by mouth daily.   dicyclomine (BENTYL) 10 MG capsule TAKE 1 CAPSULE BY MOUTH 4  TIMES DAILY AS NEEDED   furosemide (LASIX) 20 MG tablet TAKE 1 TABLET BY MOUTH  DAILY AS NEEDED    hydrALAZINE (APRESOLINE) 10 MG tablet Take 10 mg by mouth as needed.   losartan (COZAAR) 50 MG tablet TAKE 1 TABLET BY MOUTH  TWICE DAILY   metoCLOPramide (REGLAN) 5 MG tablet Take 1 tablet (5 mg total) by mouth 2 (two) times daily as needed.   metoprolol tartrate (LOPRESSOR) 50 MG tablet TAKE 1 AND 1/2 TABLETS BY  MOUTH TWICE DAILY   NEXIUM 40 MG capsule Take 1 capsule (40 mg total) by mouth daily before breakfast.   ondansetron (ZOFRAN) 4 MG tablet Take 4 mg by mouth every 4 (four) hours as needed. For nausea   potassium chloride (KLOR-CON) 10 MEQ tablet TAKE 1 TABLET BY MOUTH  DAILY   rosuvastatin (CRESTOR) 20 MG tablet TAKE 1 TABLET BY MOUTH  DAILY   thiamine (VITAMIN B-1) 100 MG tablet Take 100 mg by mouth every other day.   Blood Glucose Monitoring Suppl (ONE TOUCH ULTRA 2) w/Device KIT USE TO CHECK BLOOD SUGAR AS DIRECTED   COVID-19 At Home Antigen Test (CARESTART COVID-19 HOME TEST) KIT Use as directed   Lancets (ONETOUCH ULTRASOFT) lancets USE AS DIRECTED 3 TIMES  DAILY   metFORMIN (GLUCOPHAGE-XR) 500 MG 24 hr tablet TAKE 1 TABLET BY MOUTH IN  THE MORNING AND AT BEDTIME   ONETOUCH ULTRA test strip CHECK BLOOD SUGAR 3 TIMES  DAILY OR AS NEEDED.   topiramate (TOPAMAX) 25 MG tablet Take 1 tablet (25 mg total) by mouth 2 (two) times daily. (Patient not taking: Reported on 03/03/2021)   No facility-administered encounter medications  on file as of 03/03/2021.    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   Renal insufficiency 06/18/2019   Educated about COVID-19 virus infection 05/15/2019   Atrophic vaginitis 01/20/2019   Stenosis of carotid artery 11/12/2018   Anxiety 10/13/2018   Thrush 12/07/2017   Sinusitis 10/11/2017   Headache 07/12/2017    Dizzy spells 11/21/2016   Constipation 11/21/2016   Nonrheumatic aortic valve stenosis 05/23/2016   Aortic atherosclerosis (HCC) 05/23/2016   Hematuria 03/16/2016   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   Pain of upper abdomen 06/06/2015   Neck pain 04/22/2015   Ear pain 02/28/2015   Abnormal urine 11/21/2014   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   Anemia 06/08/2014   RLS (restless legs syndrome) 11/23/2013   Diabetic peripheral neuropathy ([place]) 10/29/2013   Left-sided thoracic back pain 10/06/2013   Encounter for preventative adult health care exam with abnormal findings 09/14/2013   Iron deficiency anemia    Status post laparoscopy 02/25/2013   Hyponatremia 01/09/2013   GERD (gastroesophageal reflux disease) 01/09/2013   Amaurosis fugax of left eye 10/16/2012   Low back pain 06/03/2012   Vitamin D deficiency 03/11/2012   Bilateral hand pain 10/20/2011   Encounter for long-term (current) use of other medications 10/20/2011   IBS (irritable bowel syndrome) 08/14/2011   TIA (transient ischemic attack) 02/10/2011   Abnormal brain CT 01/19/2011   Allergic rhinitis 10/01/2010   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   FUNDIC GLAND POLYPS OF STOMACH 03/18/2010   Abdominal pain in female 03/18/2010   Pain in joint 88/75/7972   SYSTOLIC MURMUR 82/07/154   Paroxysmal supraventricular tachycardia ([place]) 01/12/2009   PLANTAR FASCIITIS 06/08/2008   CHEST PAIN 05/18/2008   Gastroparesis 12/18/2007   HYPERCHOLESTEROLEMIA 06/11/2007   Diabetes mellitus type 2 in obese ([place]) 09/05/2006   Essential hypertension 09/05/2006    Conditions to be addressed/monitored:HTN, HLD, DMII, GERD, and Gastroparesis, irritable bowel syndrome  Care Plan : RN Care Manager Plan of Care  Updates made by [name], RN since 03/03/2021 12:00 AM     Problem: Chronic Disease Management Education and/or Care Coordination needs   Priority: High     Long-Range Goal: Development of  Plan of Care for Chronic Disease Management   Start Date: 01/10/2021  Expected End Date: 04/10/2021  Priority: High  Note:   Current Barriers: Mrs. Riddle reports she is "ok". Blood sugar this morning 120. She states that she is aware that latest A1C is 6.2 (completed 03/01/21). She is interested in lowering cholesterol level and LDL level. She expresses some of the recommended foods to lower levels she is not able to eat and is interested in seeing a dietitian. Neurologist office visit on 03/02/21 for chronic symptoms-Left facial numbness paraesthesia-prescribed Topamax- she states she has not started and unsure if she is going to take. Followed by Dr. Carlean Purl for gastroparesis, irritable bowel syndrome, gastric reflux. Ms. Stout continues to take Dicyclomine as needed-Last taken was last week. Knowledge Deficits related to plan of care for management of HTN and DMII  Limited Social Support Does not adhere to prescribed medication regimen-reports taking medications as prescribed. RNCM Clinical Goal(s):  Patient will take all medications exactly as prescribed and will call provider for medication related questions  as evidenced by self report and/or chart notation    demonstrate improved adherence to prescribed treatment plan for HTN and DMII as evidenced by improved daily monitoring and recording of CBG, adherence to ADA/care modified diet, increase activity, improved adherence to prescribed medication regimen, contact provider for new or worsened symptoms or questions.  through collaboration with Consulting civil engineer, provider, and care team.   Interventions: 1:1 collaboration with primary care provider regarding development and update of comprehensive plan of care as evidenced by provider attestation and co-signature Inter-disciplinary care team collaboration (see longitudinal plan of care) Evaluation of current treatment plan related to  self management and patient's adherence to plan as established by  provider  Diabetes Interventions:  (Status:  Goal on track:  Yes.) Long Term Goal Assessed patient's understanding of A1c goal: <7% Reviewed medications with patient and discussed importance of medication adherence Discussed meal plan with patient. With multiple medical conditions, Ms Vester would like referral to nutritionist  Reviewed blood sugar goals with patient: FBS 80-130 and after meal <180. Lab Results  Component Value Date   HGBA1C 6.3 11/01/2020  Hypertension Interventions:  (Status:  Goal on track:  Yes.) Long Term Goal Last practice recorded BP readings:  BP Readings from Last 3 Encounters:  03/02/21 (!) 144/80  02/15/21 137/62  01/07/21 (!) 148/62  Most recent eGFR/CrCl:  Lab Results  Component Value Date   EGFR 75 (L) 07/08/2015    No components found for: CRCL  Evaluation of current treatment plan related to hypertension self management and patient's adherence to plan as established by provider Reviewed medications with patient and discussed importance of compliance Encouraged to begin to check and record blood pressure   Reviewed upcoming/scheduled appointments  Gastroparesis/Irritable Bowel Syndrome/Acid Reflux Interventions  (Status:  New goal.)  Long Term Goal Encouraged patient to attend provider appointments as scheduled, notify Dr. Carlean Purl if symptoms worsen Advised patient to discuss GI diet with provider  Hyperlipidemia Interventions:  (Status:  New goal.) Long Term Goal Medication review performed; medication list updated in electronic medical record.  Provided HLD educational materials Reviewed importance of limiting foods high in cholesterol  Patient requesting referral to dietician. Provider notified  Patient Goals/Self-Care Activities: Call provider office for new concerns or questions  eat healthy, may need to eat small frequent meals, monitor portion sizes write blood pressure results in a log or diary keep all doctor appointments check  blood sugar at prescribed times and check blood sugar if you feel it is too high or too low or during times when your usual meal intake has changed Goals for blood sugar readings: Fasting 80-130 and after a meal <180. contact your RN Care Manager with care management and/or care coordination needs Review education provided on lowering cholesterol and IBS diet   Plan:Telephone follow up appointment with care management team member scheduled for:  03/29/21 The patient has been provided with contact information for the care management team and has been advised to call with any health related questions or concerns.   Thea Silversmith, RN, MSN, BSN, CCM Care Management Coordinator [facility] [phone]

## 2021-03-03 NOTE — Telephone Encounter (Signed)
°  Care Management   Follow Up Note   03/03/2021 Name: [name] MRN: [id number] DOB: [birth year]   Referred by: [name], MD Reason for referral : Chronic Care Management   An unsuccessful telephone outreach was attempted today. The patient was referred to the case management team for assistance with care management and care coordination.  No answer. Unable to leave voice message.  Follow Up Plan: The care management team will reach out to the patient again over the next 30 days.   Thea Silversmith, RN, MSN, BSN, CCM Care Management Coordinator [facility] [phone]

## 2021-03-05 LAB — VITAMIN B1: Vitamin B1 (Thiamine): 14 nmol/L (ref 8–30)

## 2021-03-07 ENCOUNTER — Ambulatory Visit: Payer: Medicare Other | Admitting: Family Medicine

## 2021-03-08 ENCOUNTER — Encounter: Payer: Self-pay | Admitting: Family Medicine

## 2021-03-08 ENCOUNTER — Ambulatory Visit (INDEPENDENT_AMBULATORY_CARE_PROVIDER_SITE_OTHER): Payer: Medicare Other | Admitting: Family Medicine

## 2021-03-08 VITALS — BP 104/60 | HR 65 | Temp 98.1°F | Resp 16 | Ht 61.0 in | Wt 169.0 lb

## 2021-03-08 DIAGNOSIS — D500 Iron deficiency anemia secondary to blood loss (chronic): Secondary | ICD-10-CM

## 2021-03-08 DIAGNOSIS — I10 Essential (primary) hypertension: Secondary | ICD-10-CM

## 2021-03-08 DIAGNOSIS — I471 Supraventricular tachycardia, unspecified: Secondary | ICD-10-CM

## 2021-03-08 DIAGNOSIS — E119 Type 2 diabetes mellitus without complications: Secondary | ICD-10-CM

## 2021-03-08 DIAGNOSIS — D538 Other specified nutritional anemias: Secondary | ICD-10-CM

## 2021-03-08 DIAGNOSIS — L578 Other skin changes due to chronic exposure to nonionizing radiation: Secondary | ICD-10-CM | POA: Diagnosis not present

## 2021-03-08 DIAGNOSIS — E519 Thiamine deficiency, unspecified: Secondary | ICD-10-CM | POA: Diagnosis not present

## 2021-03-08 DIAGNOSIS — R739 Hyperglycemia, unspecified: Secondary | ICD-10-CM | POA: Diagnosis not present

## 2021-03-08 DIAGNOSIS — E669 Obesity, unspecified: Secondary | ICD-10-CM

## 2021-03-08 DIAGNOSIS — J329 Chronic sinusitis, unspecified: Secondary | ICD-10-CM | POA: Diagnosis not present

## 2021-03-08 DIAGNOSIS — E559 Vitamin D deficiency, unspecified: Secondary | ICD-10-CM

## 2021-03-08 DIAGNOSIS — E7800 Pure hypercholesterolemia, unspecified: Secondary | ICD-10-CM

## 2021-03-08 DIAGNOSIS — E1169 Type 2 diabetes mellitus with other specified complication: Secondary | ICD-10-CM | POA: Diagnosis not present

## 2021-03-08 DIAGNOSIS — K1370 Unspecified lesions of oral mucosa: Secondary | ICD-10-CM

## 2021-03-08 NOTE — Assessment & Plan Note (Signed)
And irritated Sks on shoulders referred to derm for further evaluation

## 2021-03-08 NOTE — Assessment & Plan Note (Signed)
Well controlled, no changes to meds. Encouraged heart healthy diet such as the DASH diet and exercise as tolerated.  °

## 2021-03-08 NOTE — Assessment & Plan Note (Addendum)
Complains of pressure in room of mouth and sinus congestion no obvious finding on exam except narrow nasal passages referred to ENT for eval

## 2021-03-08 NOTE — Assessment & Plan Note (Signed)
Encourage heart healthy diet such as MIND or DASH diet, increase exercise, avoid trans fats, simple carbohydrates and processed foods, consider a krill or fish or flaxseed oil cap daily.  °

## 2021-03-08 NOTE — Patient Instructions (Addendum)
Shingrix is the new shingles shot, 2 shots over 2-6 months, confirm coverage with insurance and document, then can return here for shots with nurse appt or at pharmacy  Tdap (tetanus) only covered with injury can pay cash at pharmacy if you want one Preventative New bivalent booster  Hypertension, Adult High blood pressure (hypertension) is when the force of blood pumping through the arteries is too strong. The arteries are the blood vessels that carry blood from the heart throughout the body. Hypertension forces the heart to work harder to pump blood and may cause arteries to become narrow or stiff. Untreated or uncontrolled hypertension can cause a heart attack, heart failure, a stroke, kidney disease, and other problems. A blood pressure reading consists of a higher number over a lower number. Ideally, your blood pressure should be below 120/80. The first ("top") number is called the systolic pressure. It is a measure of the pressure in your arteries as your heart beats. The second ("bottom") number is called the diastolic pressure. It is a measure of the pressure in your arteries as the heart relaxes. What are the causes? The exact cause of this condition is not known. There are some conditions that result in or are related to high blood pressure. What increases the risk? Some risk factors for high blood pressure are under your control. The following factors may make you more likely to develop this condition: Smoking. Having type 2 diabetes mellitus, high cholesterol, or both. Not getting enough exercise or physical activity. Being overweight. Having too much fat, sugar, calories, or salt (sodium) in your diet. Drinking too much alcohol. Some risk factors for high blood pressure may be difficult or impossible to change. Some of these factors include: Having chronic kidney disease. Having a family history of high blood pressure. Age. Risk increases with age. Race. You may be at higher risk if  you are African American. Gender. Men are at higher risk than women before age 47. After age 72, women are at higher risk than men. Having obstructive sleep apnea. Stress. What are the signs or symptoms? High blood pressure may not cause symptoms. Very high blood pressure (hypertensive crisis) may cause: Headache. Anxiety. Shortness of breath. Nosebleed. Nausea and vomiting. Vision changes. Severe chest pain. Seizures. How is this diagnosed? This condition is diagnosed by measuring your blood pressure while you are seated, with your arm resting on a flat surface, your legs uncrossed, and your feet flat on the floor. The cuff of the blood pressure monitor will be placed directly against the skin of your upper arm at the level of your heart. It should be measured at least twice using the same arm. Certain conditions can cause a difference in blood pressure between your right and left arms. Certain factors can cause blood pressure readings to be lower or higher than normal for a short period of time: When your blood pressure is higher when you are in a health care provider's office than when you are at home, this is called white coat hypertension. Most people with this condition do not need medicines. When your blood pressure is higher at home than when you are in a health care provider's office, this is called masked hypertension. Most people with this condition may need medicines to control blood pressure. If you have a high blood pressure reading during one visit or you have normal blood pressure with other risk factors, you may be asked to: Return on a different day to have your blood pressure  checked again. Monitor your blood pressure at home for 1 week or longer. If you are diagnosed with hypertension, you may have other blood or imaging tests to help your health care provider understand your overall risk for other conditions. How is this treated? This condition is treated by making  healthy lifestyle changes, such as eating healthy foods, exercising more, and reducing your alcohol intake. Your health care provider may prescribe medicine if lifestyle changes are not enough to get your blood pressure under control, and if: Your systolic blood pressure is above 130. Your diastolic blood pressure is above 80. Your personal target blood pressure may vary depending on your medical conditions, your age, and other factors. Follow these instructions at home: Eating and drinking  Eat a diet that is high in fiber and potassium, and low in sodium, added sugar, and fat. An example eating plan is called the DASH (Dietary Approaches to Stop Hypertension) diet. To eat this way: Eat plenty of fresh fruits and vegetables. Try to fill one half of your plate at each meal with fruits and vegetables. Eat whole grains, such as whole-wheat pasta, brown rice, or whole-grain bread. Fill about one fourth of your plate with whole grains. Eat or drink low-fat dairy products, such as skim milk or low-fat yogurt. Avoid fatty cuts of meat, processed or cured meats, and poultry with skin. Fill about one fourth of your plate with lean proteins, such as fish, chicken without skin, beans, eggs, or tofu. Avoid pre-made and processed foods. These tend to be higher in sodium, added sugar, and fat. Reduce your daily sodium intake. Most people with hypertension should eat less than 1,500 mg of sodium a day. Do not drink alcohol if: Your health care provider tells you not to drink. You are pregnant, may be pregnant, or are planning to become pregnant. If you drink alcohol: Limit how much you use to: 0-1 drink a day for women. 0-2 drinks a day for men. Be aware of how much alcohol is in your drink. In the [place] one drink equals one 12 oz bottle of beer (355 mL), one 5 oz glass of wine (148 mL), or one 1 oz glass of hard liquor (44 mL). Lifestyle  Work with your health care provider to maintain a healthy body  weight or to lose weight. Ask what an ideal weight is for you. Get at least 30 minutes of exercise most days of the week. Activities may include walking, swimming, or biking. Include exercise to strengthen your muscles (resistance exercise), such as Pilates or lifting weights, as part of your weekly exercise routine. Try to do these types of exercises for 30 minutes at least 3 days a week. Do not use any products that contain nicotine or tobacco, such as cigarettes, e-cigarettes, and chewing tobacco. If you need help quitting, ask your health care provider. Monitor your blood pressure at home as told by your health care provider. Keep all follow-up visits as told by your health care provider. This is important. Medicines Take over-the-counter and prescription medicines only as told by your health care provider. Follow directions carefully. Blood pressure medicines must be taken as prescribed. Do not skip doses of blood pressure medicine. Doing this puts you at risk for problems and can make the medicine less effective. Ask your health care provider about side effects or reactions to medicines that you should watch for. Contact a health care provider if you: Think you are having a reaction to a medicine you are taking.  Have headaches that keep coming back (recurring). Feel dizzy. Have swelling in your ankles. Have trouble with your vision. Get help right away if you: Develop a severe headache or confusion. Have unusual weakness or numbness. Feel faint. Have severe pain in your chest or abdomen. Vomit repeatedly. Have trouble breathing. Summary Hypertension is when the force of blood pumping through your arteries is too strong. If this condition is not controlled, it may put you at risk for serious complications. Your personal target blood pressure may vary depending on your medical conditions, your age, and other factors. For most people, a normal blood pressure is less than  120/80. Hypertension is treated with lifestyle changes, medicines, or a combination of both. Lifestyle changes include losing weight, eating a healthy, low-sodium diet, exercising more, and limiting alcohol. This information is not intended to replace advice given to you by your health care provider. Make sure you discuss any questions you have with your health care provider. Document Revised: 10/03/2017 Document Reviewed: 10/03/2017 Elsevier Patient Education  [place].

## 2021-03-08 NOTE — Progress Notes (Signed)
Subjective:    [name], female    DOB: [birth year], 81 y.o.   MRN: [id number]  Chief Complaint  Patient presents with   3 months follow up    HPI Patient is in today for follow up on chronic medical concerns. No recent febrile illness or hospitalizations. She is complaining of a sense of pressure in the roof of her mouth, she reports it has felt this way for several years. She has also note some irritated lesions on both posterior shoulders. Denies CP/palp/SOB/HA/congestion/fevers/GI or GU c/o. Taking meds as prescribed   Past Medical History:  Diagnosis Date   Abdominal pain in female 03/18/2010   Qualifier: Diagnosis of  By: Carlean Purl MD, Tonna Boehringer E    Anemia 06/08/2014   Anxiety    Arthritis    Spinal Osteoarthritis   Cancer ([place])    Carcinoid tumor of stomach    Cataract    Chest pain    Myoview 12/15 no ischemia.   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 ([place]) 09/05/2006   Qualifier: Diagnosis of  By: Marca Ancona RMA, Lucy     Diabetic peripheral neuropathy ([place]) 10/29/2013   Encounter for preventative adult health care exam with abnormal findings 09/14/2013   Esophageal reflux    Gastric polyp    Fundic Gland   Gastroparesis    Headache(784.0)    Heart murmur    Echocardiogram 2/11: EF 60-65%, mild LAE, grade 1 diastolic dysfunction, aortic valve sclerosis, mean gradient 9 mm of mercury, PASP 34   Hematuria 03/16/2016   Iron deficiency anemia, unspecified    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   Leg swelling    bilateral   Neck pain 04/22/2015   PONV (postoperative nausea and vomiting)    pt states only needs small amount of anesthesia   PSVT (paroxysmal supraventricular tachycardia) ([place])    Pure hypercholesterolemia    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   Renal insufficiency 06/18/2019   Stroke ([place])    tia, 2014   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   Type II or unspecified type diabetes  mellitus without mention of complication, not stated as uncontrolled    Unspecified essential hypertension    Unspecified hereditary and idiopathic peripheral neuropathy 10/29/2013    Past Surgical History:  Procedure Laterality Date   CHOLECYSTECTOMY  1993   COLONOSCOPY  11/11/2010   diverticulosis   DILATATION & CURRETTAGE/HYSTEROSCOPY WITH RESECTOCOPE N/A 02/25/2013   Procedure: Attempted hysteroscopy with uterine perforation;  Surgeon: Jamey Reas de Berton Lan, MD;  Location: [facility];  Service: Gynecology;  Laterality: N/A;   ESOPHAGOGASTRODUODENOSCOPY  08/29/2010; 09/15/2010   Carcinoid tumor less than 1 cm in July 2012 not seen in August 2012 , gastritis, fundic gland polyps   ESOPHAGOGASTRODUODENOSCOPY  05/16/2011   ESOPHAGOGASTRODUODENOSCOPY  06/14/2012   EUS  12/15/2010   Procedure: UPPER ENDOSCOPIC ULTRASOUND (EUS) LINEAR;  Surgeon: Owens Loffler, MD;  Location: [facility];  Service: Endoscopy;  Laterality: N/A;   EYE SURGERY Bilateral    Bi lateral cateracts and bi lateral laser   LAPAROSCOPY N/A 02/25/2013   Procedure: Cystoscopy and laparoscopy with fulguration of uterine serosa;  Surgeon: Jamey Reas de Berton Lan, MD;  Location: [facility];  Service: Gynecology;  Laterality: N/A;   TONSILLECTOMY      Family History  Problem Relation Age of Onset   Diabetes Mother    Stroke Father  deceased age 63   Heart disease Sister        deceased MI age 24   Diabetes Sister    Heart disease Sister    Hypertension Sister    Hyperlipidemia Sister    Diabetes Sister    Heart disease Sister    Hypertension Sister    Hyperlipidemia Sister    Heart disease Brother        deceased MI age 86   Intellectual disability Brother    Diabetes Brother    Heart disease Brother    Hypertension Brother    Hyperlipidemia Brother    Diabetes Maternal Grandmother    Hypertension Paternal Grandmother    Colon cancer Neg Hx    Esophageal cancer Neg Hx    Stomach cancer  Neg Hx    Rectal cancer Neg Hx     Social History   Socioeconomic History   Marital status: Widowed    Spouse name: Not on file   Number of children: 0   Years of education: college   Highest education level: Not on file  Occupational History   Occupation: retired  Tobacco Use   Smoking status: Never   Smokeless tobacco: Never   Tobacco comments:    Never used tobacco  Vaping Use   Vaping Use: Never used  Substance and Sexual Activity   Alcohol use: No    Alcohol/week: 0.0 standard drinks   Drug use: No   Sexual activity: Never    Partners: Male    Birth control/protection: Post-menopausal    Comment: lives alone, no dietary restrictions except avoid fresh veg, fruit, whole grains  Other Topics Concern   Not on file  Social History Narrative   Patient was married ([name]) - widow   Patient does not have any children.   Patient is right-handed.   Patient has a BA degree.   One caffeine drink daily    Social Determinants of Health   Financial Resource Strain: Low Risk    Difficulty of Paying Living Expenses: Not hard at all  Food Insecurity: No Food Insecurity   Worried About Charity fundraiser in the Last Year: Never true   Ran Out of Food in the Last Year: Never true  Transportation Needs: No Transportation Needs   Lack of Transportation (Medical): No   Lack of Transportation (Non-Medical): No  Physical Activity: Not on file  Stress: Not on file  Social Connections: Not on file  Intimate Partner Violence: Not on file    Outpatient Medications Prior to Visit  Medication Sig Dispense Refill   acetaminophen (TYLENOL) 325 MG tablet Take 2 tablets (650 mg total) by mouth every 6 (six) hours as needed for up to 30 doses for mild pain or moderate pain. 30 tablet 0   amLODipine (NORVASC) 5 MG tablet TAKE 1 TABLET BY MOUTH  DAILY 90 tablet 3   aspirin EC 81 MG tablet Take 81 mg by mouth daily. Swallow whole.     betamethasone valerate ointment (VALISONE) 0.1 % Apply  qod to affected area, small amount 45 g 1   Blood Glucose Monitoring Suppl (ONE TOUCH ULTRA 2) w/Device KIT USE TO CHECK BLOOD SUGAR AS DIRECTED 1 kit 0   cholecalciferol (VITAMIN D) 1000 UNITS tablet Take 1,000 Units by mouth daily.     COVID-19 At Home Antigen Test [facility] COVID-19 HOME TEST) KIT Use as directed 2 kit 0   dicyclomine (BENTYL) 10 MG capsule TAKE 1 CAPSULE BY MOUTH 4  TIMES DAILY AS NEEDED 240 capsule 0   furosemide (LASIX) 20 MG tablet TAKE 1 TABLET BY MOUTH  DAILY AS NEEDED 90 tablet 3   hydrALAZINE (APRESOLINE) 10 MG tablet Take 10 mg by mouth as needed.     Lancets (ONETOUCH ULTRASOFT) lancets USE AS DIRECTED 3 TIMES  DAILY 300 each 3   losartan (COZAAR) 50 MG tablet TAKE 1 TABLET BY MOUTH  TWICE DAILY 180 tablet 1   metFORMIN (GLUCOPHAGE-XR) 500 MG 24 hr tablet TAKE 1 TABLET BY MOUTH IN  THE MORNING AND AT BEDTIME 180 tablet 3   metoCLOPramide (REGLAN) 5 MG tablet Take 1 tablet (5 mg total) by mouth 2 (two) times daily as needed. 180 tablet 0   metoprolol tartrate (LOPRESSOR) 50 MG tablet TAKE 1 AND 1/2 TABLETS BY  MOUTH TWICE DAILY 270 tablet 3   NEXIUM 40 MG capsule Take 1 capsule (40 mg total) by mouth daily before breakfast. 90 capsule 3   ondansetron (ZOFRAN) 4 MG tablet Take 4 mg by mouth every 4 (four) hours as needed. For nausea     ONETOUCH ULTRA test strip CHECK BLOOD SUGAR 3 TIMES  DAILY OR AS NEEDED. 300 strip 3   potassium chloride (KLOR-CON) 10 MEQ tablet TAKE 1 TABLET BY MOUTH  DAILY 90 tablet 3   rosuvastatin (CRESTOR) 20 MG tablet TAKE 1 TABLET BY MOUTH  DAILY 90 tablet 3   thiamine (VITAMIN B-1) 100 MG tablet Take 100 mg by mouth every other day.     topiramate (TOPAMAX) 25 MG tablet Take 1 tablet (25 mg total) by mouth 2 (two) times daily. 120 tablet 3   No facility-administered medications prior to visit.    Allergies  Allergen Reactions   Tramadol Other (See Comments)    Dizziness     Review of Systems  Constitutional:  Positive for  malaise/fatigue. Negative for fever.  HENT:  Positive for sinus pain. Negative for congestion.   Eyes:  Negative for blurred vision.  Respiratory:  Negative for shortness of breath.   Cardiovascular:  Negative for chest pain, palpitations and leg swelling.  Gastrointestinal:  Negative for abdominal pain, blood in stool and nausea.  Genitourinary:  Negative for dysuria and frequency.  Musculoskeletal:  Positive for neck pain. Negative for falls.  Skin:  Negative for rash.  Neurological:  Negative for dizziness, loss of consciousness and headaches.  Endo/Heme/Allergies:  Negative for environmental allergies.  Psychiatric/Behavioral:  Negative for depression. The patient is not nervous/anxious.       Objective:    Physical Exam Constitutional:      General: She is not in acute distress.    Appearance: She is well-developed.  HENT:     Head: Normocephalic and atraumatic.  Eyes:     Conjunctiva/sclera: Conjunctivae normal.  Neck:     Thyroid: No thyromegaly.  Cardiovascular:     Rate and Rhythm: Normal rate and regular rhythm.     Heart sounds: Normal heart sounds. No murmur heard. Pulmonary:     Effort: Pulmonary effort is normal. No respiratory distress.     Breath sounds: Normal breath sounds.  Abdominal:     General: Bowel sounds are normal. There is no distension.     Palpations: Abdomen is soft. There is no mass.     Tenderness: There is no abdominal tenderness.  Musculoskeletal:     Cervical back: Neck supple.  Lymphadenopathy:     Cervical: No cervical adenopathy.  Skin:    General: Skin is warm  and dry.  Neurological:     Mental Status: She is alert and oriented to person, place, and time.  Psychiatric:        Behavior: Behavior normal.    BP 104/60    Pulse 65    Temp 98.1 F (36.7 C)    Resp 16    Ht 5' 1"  (1.549 m)    Wt 169 lb (76.7 kg)    LMP 02/07/1992    SpO2 98%    BMI 31.93 kg/m  Wt Readings from Last 3 Encounters:  03/08/21 169 lb (76.7 kg)  03/02/21  166 lb (75.3 kg)  02/15/21 169 lb 8 oz (76.9 kg)    Diabetic Foot Exam - Simple   No data filed    Lab Results  Component Value Date   WBC 6.8 03/01/2021   HGB 12.7 03/01/2021   HCT 38.8 03/01/2021   PLT 206.0 03/01/2021   GLUCOSE 131 (H) 03/01/2021   CHOL 205 (H) 03/01/2021   TRIG 145.0 03/01/2021   HDL 63.80 03/01/2021   LDLDIRECT 95.0 03/25/2020   LDLCALC 112 (H) 03/01/2021   ALT 18 03/01/2021   AST 19 03/01/2021   NA 135 03/01/2021   K 3.9 03/01/2021   CL 98 03/01/2021   CREATININE 0.75 03/01/2021   BUN 22 03/01/2021   CO2 28 03/01/2021   TSH 2.80 03/01/2021   INR 0.88 01/16/2011   HGBA1C 6.2 03/01/2021   MICROALBUR 1.6 11/13/2014    Lab Results  Component Value Date   TSH 2.80 03/01/2021   Lab Results  Component Value Date   WBC 6.8 03/01/2021   HGB 12.7 03/01/2021   HCT 38.8 03/01/2021   MCV 86.3 03/01/2021   PLT 206.0 03/01/2021   Lab Results  Component Value Date   NA 135 03/01/2021   K 3.9 03/01/2021   CHLORIDE 101 07/08/2015   CO2 28 03/01/2021   GLUCOSE 131 (H) 03/01/2021   BUN 22 03/01/2021   CREATININE 0.75 03/01/2021   BILITOT 0.7 03/01/2021   ALKPHOS 55 03/01/2021   AST 19 03/01/2021   ALT 18 03/01/2021   PROT 7.0 03/01/2021   ALBUMIN 4.5 03/01/2021   CALCIUM 9.9 03/01/2021   ANIONGAP 11 12/20/2020   EGFR 75 (L) 07/08/2015   GFR 75.46 03/01/2021   Lab Results  Component Value Date   CHOL 205 (H) 03/01/2021   Lab Results  Component Value Date   HDL 63.80 03/01/2021   Lab Results  Component Value Date   LDLCALC 112 (H) 03/01/2021   Lab Results  Component Value Date   TRIG 145.0 03/01/2021   Lab Results  Component Value Date   CHOLHDL 3 03/01/2021   Lab Results  Component Value Date   HGBA1C 6.2 03/01/2021       Assessment & Plan:   Problem List Items Addressed This Visit     Diabetes mellitus type 2 in obese (HCC) (Chronic)    hgba1c acceptable, minimize simple carbs. Increase exercise as tolerated. Continue  current meds      Essential hypertension (Chronic)    Well controlled, no changes to meds. Encouraged heart healthy diet such as the DASH diet and exercise as tolerated.       Relevant Orders   CBC   Comprehensive metabolic panel   TSH   Anemia (Chronic)    improved      Relevant Orders   Iron, TIBC and Ferritin Panel   HYPERCHOLESTEROLEMIA    Encourage heart healthy diet such as  MIND or DASH diet, increase exercise, avoid trans fats, simple carbohydrates and processed foods, consider a krill or fish or flaxseed oil cap daily.       Relevant Orders   Lipid panel   Paroxysmal supraventricular tachycardia (HCC)   Vitamin D deficiency    Supplement and monitor      Relevant Orders   VITAMIN D 25 Hydroxy (Vit-D Deficiency, Fractures)   Iron deficiency anemia   Relevant Orders   Iron, TIBC and Ferritin Panel   Sinusitis   Relevant Orders   Ambulatory referral to ENT   Thiamine deficiency   Relevant Orders   Vitamin B1   Oral lesion - Primary    Complains of pressure in room of mouth and sinus congestion no obvious finding on exam except narrow nasal passages referred to ENT for eval      Relevant Orders   Ambulatory referral to ENT   Sun-damaged skin    And irritated Sks on shoulders referred to derm for further evaluation      Relevant Orders   Ambulatory referral to Dermatology   Other Visit Diagnoses     Hyperglycemia       Relevant Orders   Hemoglobin A1c       I am having Devonda N. Buffin maintain her cholecalciferol, ondansetron, betamethasone valerate ointment, hydrALAZINE, rosuvastatin, NexIUM, amLODipine, potassium chloride, acetaminophen, OneTouch Ultra, onetouch ultrasoft, metFORMIN, metoprolol tartrate, aspirin EC, thiamine, dicyclomine, ONE TOUCH ULTRA 2, metoCLOPramide, Carestart COVID-19 Home Test, losartan, furosemide, and topiramate.  No orders of the defined types were placed in this encounter.    Penni Homans, MD

## 2021-03-08 NOTE — Assessment & Plan Note (Signed)
Supplement and monitor 

## 2021-03-08 NOTE — Assessment & Plan Note (Signed)
hgba1c acceptable, minimize simple carbs. Increase exercise as tolerated. Continue current meds 

## 2021-03-08 NOTE — Assessment & Plan Note (Signed)
improved

## 2021-03-10 ENCOUNTER — Other Ambulatory Visit: Payer: Self-pay | Admitting: Family Medicine

## 2021-03-17 ENCOUNTER — Ambulatory Visit (INDEPENDENT_AMBULATORY_CARE_PROVIDER_SITE_OTHER): Payer: Medicare Other | Admitting: Pharmacist

## 2021-03-17 DIAGNOSIS — I10 Essential (primary) hypertension: Secondary | ICD-10-CM

## 2021-03-17 DIAGNOSIS — G500 Trigeminal neuralgia: Secondary | ICD-10-CM

## 2021-03-17 DIAGNOSIS — E669 Obesity, unspecified: Secondary | ICD-10-CM

## 2021-03-17 DIAGNOSIS — E7800 Pure hypercholesterolemia, unspecified: Secondary | ICD-10-CM

## 2021-03-17 DIAGNOSIS — E519 Thiamine deficiency, unspecified: Secondary | ICD-10-CM

## 2021-03-17 DIAGNOSIS — E1169 Type 2 diabetes mellitus with other specified complication: Secondary | ICD-10-CM

## 2021-03-17 NOTE — Patient Instructions (Signed)
[name],  It was a pleasure speaking with you today.  I have attached a summary of our visit today and information about your health goals.  Patient Goals/Self-Care Activities take medications as prescribed,  check glucose daily , document, and provide at future appointments,  check blood pressure daily, document, and provide at future appointments Consider trying topirimate 25mg  at bedtime for 1 week, then increase to twice a day Start walking daily - 5 to 10 minues and increase as able; Continue stretching exercises recommended by Dr Leonie Man   If you have any questions or concerns, please feel free to contact me either at the phone number below or with a MyChart message.   Keep up the good work!  [name], PharmD Clinical Pharmacist [facility] [phone] (direct line)  [phone] (main office number)   Chronic Care Management Care Plan (last updated 03/17/2021)  Diabetes: A1c is at goal of <7.0% however patient reports home blood glucose has not been at goal Lab Results  Component Value Date   HGBA1C 6.2 03/01/2021   Current treatment: Metformin ER 500mg  - take 2 tablets daily  Interventions:  Eat a small snack prior to bedtime to prevent low blood glucose during sleep Education provided about recognition and treatment of hypoglycemiz If you have a low blood sugar less than 70, please eat 15 grams of carbohydrates (4 oz of juice, soda, 4 glucose tablets, or 3-4 pieces of hard candy). Wait 15 minutes and then recheck your blood sugar. If your sugar is still less than 70, eat another 15 grams of carbohydrates. Wait another 15 minutes and recheck your glucose. Continue this until your sugar is over 70. Then, eat a snack like crackers with peanut butter, apply with peanut butter or yogurt to prevent your sugar from dropping again. Reviewed home blood glucose readings and reviewed goals  Fasting blood glucose goal (before meals) = 80 to  130 Blood glucose goal after a meal = less than 180   Hypertension / Paroxysmal SVT: Controlled per last office visits but patient states blood pressure at home has been elevated Blood pressure goal < 140/90  BP Readings from Last 3 Encounters:  03/08/21 104/60  03/02/21 (!) 144/80  02/15/21 137/62   Current treatment: Metoprolol 50mg  twice a day Amlodipine 5mg  daily in evening Hydralazine 10mg  as needed for elevated blood pressure (has not taken recently)  Losartan 50mg  twice a day  Interventions:  Reviewed blood pressure goals Continue to check blood pressure at home once a day Reminded patient to make sure she has sat / rested for at least 5 mintues before checking blood pressure at home.  Continue current blood pressure medications  Hyperlipidemia / history of TIA / stenosis of carotid artery: Uncontrolled LDL goal < 70 Lab Results  Component Value Date   CHOL 205 (H) 03/01/2021   CHOL 190 11/01/2020   CHOL 213 (H) 06/28/2020   Lab Results  Component Value Date   HDL 63.80 03/01/2021   HDL 65.50 11/01/2020   HDL 78.20 06/28/2020   Lab Results  Component Value Date   LDLCALC 112 (H) 03/01/2021   LDLCALC 84 11/01/2020   LDLCALC 109 (H) 06/28/2020    Current treatment:  Rosuvastatin 20mg  daily Aspirin 81mg  daily  Interventions:  Recommended limit intake of saturated and trans fat.  Follow heart health diet   Gastroparesis / Irritable Bowel Syndrome / Acid reflux:  Goal: decrease acid reflux symptoms and improve abdominal discomfort Current treatment:  Ondansetron 4mg  - take 1 tablet every 4 hour as needed for nausea Dicyclomine 10mg  up to 4 times a day as needed (taking less than daily)  Esomeprazole 40mg  daily before breakfast (not taking regularly) Metoclopramide 5mg  twice a day Interventions:  Eat small frequent meals due to gastroparesis Can take esomeprazole 20 or 40mg  over the counter for acid reflux Also can take pepcid / famotidine 20mg  at night  if needed for acid reflux  Neuropathy / trigeminal neuralgia:  Goal: decrease nerve / facial pain while minimizing side effects Current treatment:  none Interventions:  Consider trying topiramate 25mg  at bedtime for 7 days, then twice daily Continue to do stretching exercises recommended by Dr Leonie Man   Anemia / Low B1 (thiamin):  Last B1 was back to normal at 14 (03/01/2021) Patient reports she was only taking thiamin / B1 100mg  every other day  Interventions:   Continue to take thiamine at current dose  Recheck thiamine level in 6 to 12 months   Health Maintenance:  Reviewed vaccination history and discussed benefits of COVID booster and Singrix vaccinations Plan to get Shingrix series in 2023.    Follow Up Plan: Telephone follow up appointment with care management team member scheduled for:  June 03, 2021   Patient verbalizes understanding of instructions and care plan provided today and agrees to view in [place]. Active MyChart status confirmed with patient.

## 2021-03-17 NOTE — Chronic Care Management (AMB) (Signed)
Chronic Care Management Pharmacy Note  03/17/2021 Name:  [name] MRN:  [id number] DOB:  [birth year]  Summary:  Per Dr Leonie Man LDL gaol < 70. Last LDL increased to 112. Patient is taking rosuvastatin 43m daily. Refill history indicated good adherence and patient endorses that she is taking every day. Discussed either increasing rosuvastatin to 488mor adding ezetimibe 1028mPatient declined, Wants to work on diet for 3 months. She has agreed to changes if LDL still >70 when checked 05/31/2021. Education provided regardign low fat diet.  Recommended increase physical activity. Start walking daily - 5 to 10 minues and increase as able; Continue stretching exercises recommended by Dr SetLeonie Manlso discussed patient's concerns about topiramate. For neuralgia. Encouraged her to try topirimate 43m84m bedtime for 1 week, then increase to twice a day.   Plan: follow up after next labs 06/03/2021   Subjective: [name] is an 81 y34. year old female who is a primary patient of BlytMosie Lukes.  The CCM team was consulted for assistance with disease management and care coordination needs.    Engaged with patient by telephone for follow up visit in response to provider referral for pharmacy case management and/or care coordination services.   Consent to Services:  The patient was given information about Chronic Care Management services, agreed to services, and gave verbal consent prior to initiation of services.  Please see initial visit note for detailed documentation.   Patient Care Team: BlytMosie Lukes as PCP - General (Family Medicine) HochMinus Breeding as PCP - Cardiology (Cardiology) GessGatha Mayer as Consulting Physician (Gastroenterology) Love, JameAlyson Locket (Neurology) JaffPieter Partridge as Consulting Physician (Neurology) WallLuretha Rued as Case Manager EckaCherre RobinsH-CPP (Pharmacist)  Recent office visits:  03/08/2021 - PCP (Dr BlytCharlett Blakeneral  check up. Labs ordered. No medication changes noted. Referred to ENT and dermatology 02/15/2021 - Fam Med (Dr WendNani Ravensute visit for sore throat 12/16/2020 - Fam Med (MurValere Dross). seen for lower quadrant pain on left side with urinary frequency. Prescribed Augmentin 875mg18mce a day 11/01/20-Dr BlythCharlett Blake) General follow up visit. Labs ordered. Patient states Gabapentin has not helped and she has had enough side effects that she is preparing to stop it so she will drop from 300 to 200 mg daily x 1 month then 1 tab daily x 1 month then stop. Drop Metformin to 1 tab daily. Follow up in 3 months.    Recent consult visits:   03/02/2021- Neurology (Dr SethiLeonie Manen for nubness of unclear etiology possibly poststroke paresthesias, cervical radiculopathy. Recommended continue ASA for stroke prevention. LDL <70 and A1c < 6.5. Prescribed topiramate 43mg 23my for parestesia. F/U in 3 months 01/07/2021 - GI (Dr GeeeneJilda Pandagastroparesis, diverticulitis and IBS. No medication changes. Follow up as needed.  12/20/2020 - Hematology (CarteEulas PostF/U iron deficiency anemia. Labs checked  Hospital visits:   None in last 6 months  Objective:  Lab Results  Component Value Date   CREATININE 0.75 03/01/2021   CREATININE 0.85 12/20/2020   CREATININE 0.71 12/16/2020    Lab Results  Component Value Date   HGBA1C 6.2 03/01/2021   Last diabetic Eye exam:  Lab Results  Component Value Date/Time   HMDIABEYEEXA No Retinopathy 02/28/2019 12:00 AM    Last diabetic Foot exam: No results found for: HMDIABFOOTEX      Component Value Date/Time   CHOL 205 (H) 03/01/2021 0915  TRIG 145.0 03/01/2021 0915   TRIG 94 11/16/2005 0905   HDL 63.80 03/01/2021 0915   CHOLHDL 3 03/01/2021 0915   VLDL 29.0 03/01/2021 0915   LDLCALC 112 (H) 03/01/2021 0915   LDLCALC 116 (H) 11/13/2019 0906   LDLDIRECT 95.0 03/25/2020 1149    Hepatic Function Latest Ref Rng & Units 03/01/2021 12/20/2020 12/16/2020  Total Protein  6.0 - 8.3 g/dL 7.0 7.3 7.0  Albumin 3.5 - 5.2 g/dL 4.5 4.7 4.5  AST 0 - 37 U/L _0 ALT 0 - 35 U/L _1 Alk Phosphatase 39 - 117 U/L 55 62 57  Total Bilirubin 0.2 - 1.2 mg/dL 0.7 0.6 0.8  Bilirubin, Direct 0.0 - 0.3 mg/dL - - -    Lab Results  Component Value Date/Time   TSH 2.80 03/01/2021 09:15 AM   TSH 2.16 11/01/2020 02:41 PM   FREET4 0.97 08/08/2019 11:57 AM    CBC Latest Ref Rng & Units 03/01/2021 12/20/2020 12/16/2020  WBC 4.0 - 10.5 K/uL 6.8 8.6 7.9  Hemoglobin 12.0 - 15.0 g/dL 12.7 13.5 13.2  Hematocrit 36.0 - 46.0 % 38.8 40.1 38.9  Platelets 150.0 - 400.0 K/uL 206.0 235 231.0    Lab Results  Component Value Date/Time   VD25OH 48.67 03/01/2021 09:15 AM   VD25OH 49.54 11/01/2020 02:41 PM    Clinical ASCVD: Yes  The 10-year ASCVD risk score (Arnett DK, et al., 2019) is: 38.5%   Values used to calculate the score:     Age: 8 years     Sex: Female     Is Non-Hispanic African American: No     Diabetic: Yes     Tobacco smoker: No     Systolic Blood Pressure: 616 mmHg     Is BP treated: Yes     HDL Cholesterol: 63.8 mg/dL     Total Cholesterol: 205 mg/dL    Other:   DEXA 04/26/2020: Femur Neck Right  T-score +0.6. L3 was excluded due to degenerative changes  AP Spine L1-L4 (L3)  Normal +1.9  DualFemur Neck Right Normal +0.6   Social History   Tobacco Use  Smoking Status Never  Smokeless Tobacco Never  Tobacco Comments   Never used tobacco   BP Readings from Last 3 Encounters:  03/08/21 104/60  03/02/21 (!) 144/80  02/15/21 137/62   Pulse Readings from Last 3 Encounters:  03/08/21 65  03/02/21 70  02/15/21 65   Wt Readings from Last 3 Encounters:  03/08/21 169 lb (76.7 kg)  03/02/21 166 lb (75.3 kg)  02/15/21 169 lb 8 oz (76.9 kg)    Assessment: Review of patient past medical history, allergies, medications, health status, including review of consultants reports, laboratory and other test data, was performed as part of comprehensive  evaluation and provision of chronic care management services.   SDOH:  (Social Determinants of Health) assessments and interventions performed:  SDOH Interventions    Flowsheet Row Most Recent Value  SDOH Interventions   Financial Strain Interventions Intervention Not Indicated  Physical Activity Interventions Other (Comments)  [Encouraged patient to start walking daily - 5 to 10 minues and increase as able]        CCM Care Plan  Allergies  Allergen Reactions   Tramadol Other (See Comments)    Dizziness     Medications Reviewed Today     Reviewed by [name], RPH-CPP (Pharmacist) on 03/17/21 at 1420  Med List Status: <None>   Medication Order Taking? Sig Documenting  Provider Last Dose Status Informant  acetaminophen (TYLENOL) 325 MG tablet [id number] Yes Take 2 tablets (650 mg total) by mouth every 6 (six) hours as needed for up to 30 doses for mild pain or moderate pain. Wyvonnia Dusky, MD Taking Active   amLODipine (NORVASC) 5 MG tablet [id number] Yes TAKE 1 TABLET BY MOUTH  DAILY Minus Breeding, MD Taking Active   aspirin EC 81 MG tablet [id number] Yes Take 81 mg by mouth daily. Swallow whole. [provider] Taking Active Self  betamethasone valerate ointment (VALISONE) 0.1 % [id number] Yes Apply qod to affected area, small amount [name], MD Taking Active   Blood Glucose Monitoring Suppl (ONE TOUCH ULTRA 2) w/Device KIT [id number] Yes USE TO CHECK BLOOD SUGAR AS DIRECTED [name], MD Taking Active   cholecalciferol (VITAMIN D) 1000 UNITS tablet [id number] Yes Take 1,000 Units by mouth daily. [provider] Taking Active Self  COVID-19 At Home Antigen Test [facility] COVID-19 HOME TEST) KIT [id number] Yes Use as directed Clementeen Graham, [facility] Taking Active   dicyclomine (BENTYL) 10 MG capsule [id number] Yes TAKE 1 CAPSULE BY MOUTH 4  TIMES DAILY AS NEEDED Gatha Mayer, MD Taking Active   furosemide (LASIX) 20 MG tablet [id number] Yes TAKE 1  TABLET BY MOUTH  DAILY AS NEEDED [name], MD Taking Active   hydrALAZINE (APRESOLINE) 10 MG tablet [id number] No Take 10 mg by mouth as needed.  Patient not taking: Reported on 03/17/2021   [provider] Not Taking Consider Medication Status and Discontinue   Lancets [facility] ULTRASOFT) lancets [id number] Yes USE AS DIRECTED 3 TIMES  DAILY [name], MD Taking Active   losartan (COZAAR) 50 MG tablet [id number] Yes TAKE 1 TABLET BY MOUTH  TWICE DAILY [name], MD Taking Active   metFORMIN (GLUCOPHAGE-XR) 500 MG 24 hr tablet [id number] Yes TAKE 1 TABLET BY MOUTH IN  THE MORNING AND AT BEDTIME [name], MD Taking Active   metoCLOPramide (REGLAN) 5 MG tablet [id number] Yes Take 1 tablet (5 mg total) by mouth 2 (two) times daily as needed. Gatha Mayer, MD Taking Active   metoprolol tartrate (LOPRESSOR) 50 MG tablet [id number] Yes TAKE 1 AND 1/2 TABLETS BY  MOUTH TWICE DAILY Minus Breeding, MD Taking Active   NEXIUM 40 MG capsule [id number] Yes Take 1 capsule (40 mg total) by mouth daily before breakfast. Gatha Mayer, MD Taking Active   ondansetron [facility]) 4 MG tablet [id number] Yes Take 4 mg by mouth every 4 (four) hours as needed. For nausea Renato Shin, MD Taking Active Self  [facility] ULTRA test strip [id number] Yes CHECK BLOOD SUGAR 3 TIMES  DAILY OR AS NEEDED. [name], MD Taking Active   potassium chloride (KLOR-CON) 10 MEQ tablet [id number] Yes TAKE 1 TABLET BY MOUTH  DAILY [name], MD Taking Active   rosuvastatin (CRESTOR) 20 MG tablet [id number] Yes TAKE 1 TABLET BY MOUTH  DAILY [name], MD Taking Active   thiamine (VITAMIN B-1) 100 MG tablet [id number] Yes Take 100 mg by mouth every other day. [provider] Taking Active   topiramate (TOPAMAX) 25 MG tablet [id number] No Take 1 tablet (25 mg total) by mouth 2 (two) times daily.  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   Renal insufficiency 06/18/2019   Educated about COVID-19 virus infection 05/15/2019   Atrophic vaginitis 01/20/2019   Stenosis of carotid artery 11/12/2018   Anxiety 10/13/2018   Thrush 12/07/2017   Sinusitis 10/11/2017   Headache 07/12/2017   Dizzy spells 11/21/2016   Constipation 11/21/2016   Nonrheumatic aortic valve stenosis 05/23/2016   Aortic atherosclerosis (HCC) 05/23/2016   Hematuria 03/16/2016   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   Pain of upper abdomen 06/06/2015   Neck pain 04/22/2015   Ear pain 02/28/2015   Abnormal urine 11/21/2014   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   Anemia 06/08/2014   RLS (restless legs syndrome) 11/23/2013   Diabetic peripheral neuropathy ([place]) 10/29/2013   Left-sided thoracic back pain 10/06/2013   Encounter for preventative adult health care exam with abnormal findings 09/14/2013   Iron deficiency anemia    Status post laparoscopy 02/25/2013   Hyponatremia 01/09/2013   GERD (gastroesophageal reflux disease) 01/09/2013   Amaurosis fugax of left eye 10/16/2012   Low back pain 06/03/2012   Vitamin D deficiency 03/11/2012   Bilateral hand pain 10/20/2011   Encounter for long-term (current) use of other medications 10/20/2011   IBS (irritable bowel syndrome) 08/14/2011   TIA (transient ischemic attack) 02/10/2011   Abnormal brain CT 01/19/2011   Allergic rhinitis 10/01/2010   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) 01/12/2009   PLANTAR FASCIITIS 06/08/2008   CHEST PAIN 05/18/2008   Gastroparesis 12/18/2007   HYPERCHOLESTEROLEMIA 06/11/2007   Diabetes mellitus type 2 in obese ([place]) 09/05/2006   Essential hypertension 09/05/2006    Immunization History  Administered Date(s) Administered   Fluad Quad(high Dose 65+) 11/18/2019, 12/01/2020   Influenza Split 10/31/2010, 11/28/2011   Influenza Whole 11/19/2007, 11/04/2008, 11/10/2009   Influenza, High Dose Seasonal PF 11/11/2015, 11/21/2016   Influenza,inj,Quad PF,6+ Mos 01/09/2013, 10/20/2013, 11/13/2014, 11/06/2018   PFIZER(Purple Top)SARS-COV-2 Vaccination 03/14/2019, 04/09/2019, 12/29/2019   Pneumococcal Conjugate-13 09/11/2013   Pneumococcal Polysaccharide-23 03/13/2007   Td 03/02/2005    Conditions to be addressed/monitored: HTN, HLD, DMII, and gastroparesis; IBS; GERD; iron malabsorption / anemia; history of low thiamin; neuropathy / trigeminal neuralgia; h/o TIA; carotid artery stenosis;   Care Plan : General Pharmacy (Adult)  Updates made by [name], RPH-CPP since 03/17/2021 12:00 AM     Problem: HTN, type 2 DM; h/o TIA; hyperlipidemia; aorotic valve stenosis; paroxysmal SVT; hyponatremia; gastroparesis; IBS; GERD; iron malabsortion.   Priority: High  Onset Date: 11/16/2020     Long-Range Goal: Provide education, support and care coordination for medication therapy and chronic conditions   Start Date: 11/16/2020  Priority: High  Note:   Current Barriers:  Unable to independently monitor therapeutic efficacy Unable to achieve control of hyperlipidemia  Unable to maintain control of type 2 diabetes and hypertension Has not made recommended medication changes following last appointment / labs with primary care physician  Pharmacist Clinical Goal(s):  Over the next 90 days, patient will achieve adherence to monitoring guidelines and medication adherence to achieve therapeutic efficacy achieve  control of hyperlipidmeia  as  evidenced by LDL <70 maintain control of type 2 diabetes and hypertension  as evidenced by A1c <7.0, FBG 80 to 130 and post prandial blood glucose <180 and blood pressure <140/90  adhere to prescribed medication regimen as evidenced by refill history and self reporting  through collaboration with PharmD and provider.   Interventions: 1:1 collaboration with [name], MD regarding development and update of comprehensive plan of care as evidenced by provider attestation and co-signature Inter-disciplinary care team collaboration (see longitudinal plan of care) Comprehensive medication review performed; medication list updated in electronic medical record  Diabetes: A1c is at goal of <7.0%  Last A1c was 6.2% (03/01/2021); Previous A1c was 6.3% (11/01/2020) Current treatment: Metformin ER 554m - Take 1 tablet twice a day with food Current glucose readings per patient: 100 to 150                                  Has been seen by endocrinologist in past (Dr ELoanne Drilling when her husband was still alive. He had type 2 diabetic and required insulin therapy. Current meal patterns: limits salt intake and fast food. She states that she did have more sweets during Christmas.  Interventions:  Reviewed home blood glucose readings and reviewed goals  Fasting blood glucose goal (before meals) = 80 to 130 Blood glucose goal after a meal = less than 180  Education provided about recognition and treatment of hypoglycemia If you have a low blood sugar less than 70, please eat 15 grams of carbohydrates (4 oz of juice, soda, 4 glucose tablets, or 3-4 pieces of hard candy. 1 teaspoon of honey or sugar).  Wait 15 minutes and then recheck your blood sugar. If your sugar is still less than 70, eat another 15 grams of carbohydrates.  Wait another 15 minutes and recheck your glucose. Continue this until your sugar is over 70.  Once blood glucose is >70 you should eat a snack like  crackers with peanut butter, apple with peanut butter or yogurt to prevent your sugar from dropping again. Reminded to get yearly eye exam Continue current therapy for diabetes  Hypertension / Paroxysmal SVT: Controlled per last office visit Blood pressure goal < 140/90  BP Readings from Last 3 Encounters:  03/08/21 104/60  03/02/21 (!) 144/80  02/15/21 137/62  Current treatment: Metoprolol 573mtwice a day Amlodipine 75m64maily in evening Hydralazine 61m475m needed for elevated blood pressure (has not taken recently)  Losartan 50mg22mce a day  Home blood pressure readings:  SBP 110 to 140 DBP 70 to 90  Denies hypotensive/hypertensive symptoms Interventions:  Reviewed blood pressure goals Continue to check blood pressure at home once a day Reminded patient to make sure she has sat / rested for at least 5 mintues before checking blood pressure at home.  Continue current blood pressure medications  Hyperlipidemia / history of TIA / stenosis of carotid artery: Uncontrolled LDL goal < 70 LDL was 84 (11/01/2020) and most recent LDL increased to 112 (03/01/2021) Current treatment:  Rosuvastatin 20mg 52my Aspirin 81mg d68m  Interventions:  Reviewed refill history for rosuvastatin due to increase in LDL - patient has filled on time and endorses that she is taking daily Recommended limit intake of saturated and trans fat.  Recommended addition of ezetimibe 61mg da64mbut patient declined. Feels that she has not been following low fat diet. Will try for 3 months, If still elevated when Lipids  rechecked in April 2023, then she  agrees to add ezetimibe.   Gastroparesis / Irritable Bowel Syndrome / Acid reflux:  Managed by Dr Carlean Purl Not following any specific diet related to gastroparesis.  Current treatment:  Ondansetron 37m - take 1 tablet every 4 hour as needed for nausea Dicyclomine 180mup to 4 times a day as needed (taking less than daily)  Esomeprazole 4078maily before  breakfast (not taking regularly) Metoclopramide 5mg46mice a day Interventions:  Eat small frequent meals due to gastroparesis Can take esomeprazole 20 or 40mg46mr the counter for acid reflux Also can take pepcid / famotidine 20mg 80might if needed for acid reflux  Neuropathy / trigeminal neuralgia:  Patient saw Dr Sethi Leonie Manacial pain related to trigeminal neuralgia. He prescribed topiramate 25mg t95m a day but she has not started due to concerns with side effects.  Has also tried: duloxetine 08/11/2020 and gabapentin (dizziness and drowsiness) Current treatment:  none Interventions:  Encourages her to try Topiramate 25mg at51mtime for 7 days, then twice daily thereafter. Discussed low incidence of side effects. Patient will consider Continue with stretches recommended by Dr Sethi.  Leonie Mana / Low B1 (thiamin):  Managed by hematology but patient has not been in awhile due to past cost of IV iron infusions  Last CBC Lab Results  Component Value Date   WBC 6.8 03/01/2021   HGB 12.7 03/01/2021   HCT 38.8 03/01/2021   MCV 86.3 03/01/2021   MCH 28.7 12/20/2020   RDW 12.7 03/01/2021   PLT 206.0 03/01/2021  Last B1 was back to normal at 14 (03/01/2021) Taking thiamin / B1 100mg eve34mther day  Dr Blyth recCharlett Blakeded cut dose of thiamin in half.  Interventions:   Continue current dose of thiamine  Recheck thiamine level in 6 to 12 months  Health Maintenance:  Reviewed vaccination history  Plan to get Shingrix series in 2023.  Encouraged patient to start walking daily - 5 to 10 minues and increase as able; Continue stretching exercises recommended by Dr Sethi  PaLeonie Man Goals/Self-Care Activities Over the next 90 days, patient will:  take medications as prescribed,  check glucose daily , document, and provide at future appointments,  check blood pressure daily, document, and provide at future appointments Consider trying topirimate 25mg at b38mme for 1 week, then increase to  twice a day Start walking daily - 5 to 10 minues and increase as able; Continue stretching exercises recommended by Dr Sethi  FolLeonie Manp Plan: Telephone follow up appointment with care management team member scheduled for:  June 03, 2021          Medication Assistance: None required.  Patient affirms current coverage meets needs.  Patient's preferred pharmacy is:  [facility], television/film/[place]ideome[place] [place] [facility] C[place] [phone] [place] [address] LanetCedar [phone] [place] [phone] Health Presbyterian [facility] ) - [place] [phone] Up:  Patient agrees to Care Plan and Follow-up.  Plan: Telephone follow up appointment with care management team member scheduled for:  2 months  [name], PharmD Clinical Pharmacist [place] [facility]

## 2021-03-29 ENCOUNTER — Ambulatory Visit: Payer: Medicare Other

## 2021-03-29 DIAGNOSIS — E1169 Type 2 diabetes mellitus with other specified complication: Secondary | ICD-10-CM

## 2021-03-29 DIAGNOSIS — E669 Obesity, unspecified: Secondary | ICD-10-CM

## 2021-03-29 DIAGNOSIS — E7800 Pure hypercholesterolemia, unspecified: Secondary | ICD-10-CM

## 2021-03-29 DIAGNOSIS — I10 Essential (primary) hypertension: Secondary | ICD-10-CM

## 2021-03-29 NOTE — Chronic Care Management (AMB) (Signed)
Chronic Care Management   CCM RN Visit Note  03/29/2021 Name: [name] MRN: [id number] DOB: 10/30/1940  Subjective: [name] is a 81 y.o. year old female who is a primary care patient of [name], MD. The care management team was consulted for assistance with disease management and care coordination needs.    Engaged with patient by telephone for follow up visit in response to provider referral for case management and/or care coordination services.   Consent to Services:  The patient was given information about Chronic Care Management services, agreed to services, and gave verbal consent prior to initiation of services.  Please see initial visit note for detailed documentation.   Patient agreed to services and verbal consent obtained.   Assessment: Review of patient past medical history, allergies, medications, health status, including review of consultants reports, laboratory and other test data, was performed as part of comprehensive evaluation and provision of chronic care management services.   SDOH (Social Determinants of Health) assessments and interventions performed:    CCM Care Plan  Allergies  Allergen Reactions   Tramadol Other (See Comments)    Dizziness     Outpatient Encounter Medications as of 03/29/2021  Medication Sig   acetaminophen (TYLENOL) 325 MG tablet Take 2 tablets (650 mg total) by mouth every 6 (six) hours as needed for up to 30 doses for mild pain or moderate pain.   amLODipine (NORVASC) 5 MG tablet TAKE 1 TABLET BY MOUTH  DAILY   aspirin EC 81 MG tablet Take 81 mg by mouth daily. Swallow whole.   betamethasone valerate ointment (VALISONE) 0.1 % Apply qod to affected area, small amount   cholecalciferol (VITAMIN D) 1000 UNITS tablet Take 1,000 Units by mouth daily.   dicyclomine (BENTYL) 10 MG capsule TAKE 1 CAPSULE BY MOUTH 4  TIMES DAILY AS NEEDED   furosemide (LASIX) 20 MG tablet TAKE 1 TABLET BY MOUTH  DAILY AS NEEDED    losartan (COZAAR) 50 MG tablet TAKE 1 TABLET BY MOUTH  TWICE DAILY   metFORMIN (GLUCOPHAGE-XR) 500 MG 24 hr tablet TAKE 1 TABLET BY MOUTH IN  THE MORNING AND AT BEDTIME   metoCLOPramide (REGLAN) 5 MG tablet Take 1 tablet (5 mg total) by mouth 2 (two) times daily as needed.   metoprolol tartrate (LOPRESSOR) 50 MG tablet TAKE 1 AND 1/2 TABLETS BY  MOUTH TWICE DAILY   NEXIUM 40 MG capsule Take 1 capsule (40 mg total) by mouth daily before breakfast.   ondansetron (ZOFRAN) 4 MG tablet Take 4 mg by mouth every 4 (four) hours as needed. For nausea   potassium chloride (KLOR-CON) 10 MEQ tablet TAKE 1 TABLET BY MOUTH  DAILY   rosuvastatin (CRESTOR) 20 MG tablet TAKE 1 TABLET BY MOUTH  DAILY   thiamine (VITAMIN B-1) 100 MG tablet Take 100 mg by mouth every other day.   Blood Glucose Monitoring Suppl (ONE TOUCH ULTRA 2) w/Device KIT USE TO CHECK BLOOD SUGAR AS DIRECTED   COVID-19 At Home Antigen Test (CARESTART COVID-19 HOME TEST) KIT Use as directed   hydrALAZINE (APRESOLINE) 10 MG tablet Take 10 mg by mouth as needed. (Patient not taking: Reported on 03/17/2021)   Lancets (ONETOUCH ULTRASOFT) lancets USE AS DIRECTED 3 TIMES  DAILY   ONETOUCH ULTRA test strip CHECK BLOOD SUGAR 3 TIMES  DAILY OR AS NEEDED.   topiramate (TOPAMAX) 25 MG tablet Take 1 tablet (25 mg total) by mouth 2 (two) times daily. (Patient not taking: Reported on 03/17/2021)  No facility-administered encounter medications on file as of 03/29/2021.    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   Renal insufficiency 06/18/2019   Educated about COVID-19 virus infection 05/15/2019   Atrophic vaginitis 01/20/2019   Stenosis of carotid artery  11/12/2018   Anxiety 10/13/2018   Thrush 12/07/2017   Sinusitis 10/11/2017   Headache 07/12/2017   Dizzy spells 11/21/2016   Constipation 11/21/2016   Nonrheumatic aortic valve stenosis 05/23/2016   Aortic atherosclerosis (HCC) 05/23/2016   Hematuria 03/16/2016   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   Pain of upper abdomen 06/06/2015   Neck pain 04/22/2015   Ear pain 02/28/2015   Abnormal urine 11/21/2014   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   Anemia 06/08/2014   RLS (restless legs syndrome) 11/23/2013   Diabetic peripheral neuropathy ([place]) 10/29/2013   Left-sided thoracic back pain 10/06/2013   Encounter for preventative adult health care exam with abnormal findings 09/14/2013   Iron deficiency anemia    Status post laparoscopy 02/25/2013   Hyponatremia 01/09/2013   GERD (gastroesophageal reflux disease) 01/09/2013   Amaurosis fugax of left eye 10/16/2012   Low back pain 06/03/2012   Vitamin D deficiency 03/11/2012   Bilateral hand pain 10/20/2011   Encounter for long-term (current) use of other medications 10/20/2011   IBS (irritable bowel syndrome) 08/14/2011   TIA (transient ischemic attack) 02/10/2011   Abnormal brain CT 01/19/2011   Allergic rhinitis 10/01/2010   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   FUNDIC GLAND POLYPS OF STOMACH 03/18/2010   Abdominal pain in female 03/18/2010   Pain in joint 93/81/0175   SYSTOLIC MURMUR 11/30/8525   Paroxysmal supraventricular tachycardia ([place]) 01/12/2009   PLANTAR FASCIITIS 06/08/2008   CHEST PAIN 05/18/2008   Gastroparesis 12/18/2007   HYPERCHOLESTEROLEMIA 06/11/2007   Diabetes mellitus type 2 in obese ([place]) 09/05/2006   Essential hypertension 09/05/2006    Conditions to be addressed/monitored:HTN, HLD, DMII, GERD, and Gastroparesis/IBS  Care Plan : RN Care Manager Plan of Care  Updates made by [name], RN since 03/29/2021 12:00 AM     Problem: Chronic Disease Management Education and/or  Care Coordination needs   Priority: High     Long-Range Goal: Development of Plan of Care for Chronic Disease Management   Start Date: 01/10/2021  Expected End Date: 04/10/2021  Priority: High  Note:   Current Barriers: Spoke with Mrs. Kareem. She states she is "doing ok". Blood sugar this morning 120 and states they have been ranging 110-130. She continues to decline taking Topamax. However she states she does neck exercises provided by neurologist every day. No questions or concerns voiced at this time. Knowledge Deficits related to plan of care for management of HTN and DMII  Limited Social Support Does not adhere to prescribed medication regimen-reports taking medications as prescribed. RNCM Clinical Goal(s):  Patient will take all medications exactly as prescribed and will call provider for medication related questions as evidenced by self report and/or chart notation    demonstrate improved adherence to prescribed treatment plan for HTN and DMII as evidenced by improved daily monitoring and recording of CBG, adherence to ADA/care modified diet, increase activity, improved adherence to prescribed medication regimen, contact provider  for new or worsened symptoms or questions.  through collaboration with Consulting civil engineer, provider, and care team.   Interventions: 1:1 collaboration with primary care provider regarding development and update of comprehensive plan of care as evidenced by provider attestation and co-signature Inter-disciplinary care team collaboration (see longitudinal plan of care) Evaluation of current treatment plan related to  self management and patient's adherence to plan as established by provider  Diabetes Interventions:  (Status:  Goal on track:  Yes.) Long Term Goal Assessed patient's understanding of A1c goal: <7% Reviewed medications with patient and discussed importance of medication adherence Followed up on request to see nutritionist. Mrs. Perazzo reports she  was contacted about going to see nutritionist, but declined Reviewed blood sugar goals with patient: FBS 80-130 and after a meal <180. Discussed healthy eating. Encouraged to avoid fast foods, saturated fats, trans fats and processed food. Lab Results  Component Value Date   HGBA1C 6.2 03/01/2021  Hypertension Interventions:  (Status:  Goal on track:  Yes.) Long Term Goal Last practice recorded BP readings:  BP Readings from Last 3 Encounters:  03/08/21 104/60  03/02/21 (!) 144/80  02/15/21 137/62  Most recent eGFR/CrCl:  Lab Results  Component Value Date   EGFR 75 (L) 07/08/2015    No components found for: CRCL  Evaluation of current treatment plan related to hypertension self management and patient's adherence to plan as established by provider Reviewed medications with patient and discussed importance of compliance Encouraged to begin to check and record blood pressure   Reviewed upcoming/scheduled appointments  Gastroparesis/Irritable Bowel Syndrome/Acid Reflux Interventions  (Status:  Goal on track:  Yes.)  Long Term Goal Encouraged patient to attend provider appointments as scheduled, notify Dr. Carlean Purl if symptoms worsen Advised patient to discuss GI diet with provider Encouraged to take medications as prescribed  Hyperlipidemia Interventions:  (Status:  Goal on track:  Yes.) Long Term Goal Medication review performed; medication list updated in electronic medical record.  Reviewed importance of limiting foods high in cholesterol   Patient Goals/Self-Care Activities: Contact your provider for any new or worsening health concers eat healthy, may need to eat small frequent meals, monitor portion sizes write blood pressure results in a log or diary keep all doctor appointments check blood sugar at prescribed times and check blood sugar if you feel it is too high or too low or during times when your usual meal intake has changed Goals for blood sugar readings: Fasting 80-130  and after a meal <180. contact your RN Care Manager with care management and/or care coordination needs   Plan:Telephone follow up appointment with care management team member scheduled for:  04/26/21 The patient has been provided with contact information for the care management team and has been advised to call with any health related questions or concerns.   Thea Silversmith, RN, MSN, BSN, CCM Care Management Coordinator [facility] [phone]

## 2021-03-29 NOTE — Patient Instructions (Addendum)
Visit Information  Thank you for taking time to visit with me today. Please don't hesitate to contact me if I can be of assistance to you before our next scheduled telephone appointment.  Following are the goals we discussed today:  Patient Goals/Self-Care Activities: Contact your provider for any new or worsening health concers eat healthy, may need to eat small frequent meals, monitor portion sizes write blood pressure results in a log or diary keep all doctor appointments check blood sugar at prescribed times and check blood sugar if you feel it is too high or too low or during times when your usual meal intake has changed Goals for blood sugar readings: Fasting 80-130 and after a meal <180. contact your RN Care Manager with care management and/or care coordination needs  Our next appointment is by telephone on 04/26/21 at 3:00 pm  Please call the care guide team at [phone] if you need to cancel or reschedule your appointment.   If you are experiencing a Mental Health or [place] or need someone to talk to, please call the Suicide and Crisis Lifeline: 988 call 1-800-273-TALK (toll free, 24 hour hotline)   Patient verbalizes understanding of instructions and care plan provided today and agrees to view in [place]. Active MyChart status confirmed with patient.    Thea Silversmith, RN, MSN, BSN, CCM Care Management Coordinator [place] [phone]

## 2021-04-05 DIAGNOSIS — E1169 Type 2 diabetes mellitus with other specified complication: Secondary | ICD-10-CM | POA: Diagnosis not present

## 2021-04-05 DIAGNOSIS — I10 Essential (primary) hypertension: Secondary | ICD-10-CM | POA: Diagnosis not present

## 2021-04-05 DIAGNOSIS — E669 Obesity, unspecified: Secondary | ICD-10-CM | POA: Diagnosis not present

## 2021-04-05 DIAGNOSIS — E7800 Pure hypercholesterolemia, unspecified: Secondary | ICD-10-CM | POA: Diagnosis not present

## 2021-04-11 ENCOUNTER — Other Ambulatory Visit (HOSPITAL_BASED_OUTPATIENT_CLINIC_OR_DEPARTMENT_OTHER): Payer: Self-pay | Admitting: Family Medicine

## 2021-04-11 DIAGNOSIS — Z1231 Encounter for screening mammogram for malignant neoplasm of breast: Secondary | ICD-10-CM

## 2021-04-19 ENCOUNTER — Inpatient Hospital Stay: Payer: Medicare Other

## 2021-04-19 ENCOUNTER — Inpatient Hospital Stay: Payer: Medicare Other | Admitting: Family

## 2021-04-26 ENCOUNTER — Ambulatory Visit (INDEPENDENT_AMBULATORY_CARE_PROVIDER_SITE_OTHER): Payer: Medicare Other

## 2021-04-26 DIAGNOSIS — I10 Essential (primary) hypertension: Secondary | ICD-10-CM

## 2021-04-26 DIAGNOSIS — E1169 Type 2 diabetes mellitus with other specified complication: Secondary | ICD-10-CM

## 2021-04-26 NOTE — Patient Instructions (Signed)
Visit Information ? ?Thank you for taking time to visit with me today. Please don't hesitate to contact me if I can be of assistance to you before our next scheduled telephone appointment. ? ?Following are the goals we discussed today:  ?Patient Goals/Self-Care Activities: ?Contact your provider for any new or worsening health concerns ?eat healthy, may need to eat small frequent meals, monitor portion sizes ?keep all doctor appointments ?check blood sugar at prescribed times and check blood sugar if you feel it is too high or too low or during times when your usual meal intake has changed ?Goals for blood sugar readings: Fasting 80-130 and after a meal <180. ?contact your RN Care Manager with care management and/or care coordination needs ? ?Our next appointment is by telephone on 05/24/21 at 2:00 pm ? ?Please call the care guide team at [phone] if you need to cancel or reschedule your appointment.  ? ?If you are experiencing a Mental Health or [place] or need someone to talk to, please call the Suicide and Crisis Lifeline: 988 ?call 1-800-273-TALK (toll free, 24 hour hotline)  ? ?Patient verbalizes understanding of instructions and care plan provided today and agrees to view in [place]. Active MyChart status confirmed with patient.   ? ?Thea Silversmith, RN, MSN, BSN, CCM ?Care Management Coordinator ?[place] ?[phone]  ?

## 2021-04-26 NOTE — Chronic Care Management (AMB) (Signed)
?Chronic Care Management  ? ?CCM RN Visit Note ? ?04/26/2021 ?Name: [name] MRN: [id number] DOB: 1941-01-08 ? ?Subjective: ?[name] is a 81 y.o. year old female who is a primary care patient of [name], MD. The care management team was consulted for assistance with disease management and care coordination needs.   ? ?Engaged with patient by telephone for follow up visit in response to provider referral for case management and/or care coordination services.  ? ?Consent to Services:  ?The patient was given information about Chronic Care Management services, agreed to services, and gave verbal consent prior to initiation of services.  Please see initial visit note for detailed documentation.  ? ?Patient agreed to services and verbal consent obtained.  ? ?Assessment: Review of patient past medical history, allergies, medications, health status, including review of consultants reports, laboratory and other test data, was performed as part of comprehensive evaluation and provision of chronic care management services.  ? ?SDOH (Social Determinants of Health) assessments and interventions performed:   ? ?CCM Care Plan ? ?Allergies  ?Allergen Reactions  ? Tramadol Other (See Comments)  ?  Dizziness ?  ? ? ?Outpatient Encounter Medications as of 04/26/2021  ?Medication Sig  ? acetaminophen (TYLENOL) 325 MG tablet Take 2 tablets (650 mg total) by mouth every 6 (six) hours as needed for up to 30 doses for mild pain or moderate pain.  ? amLODipine (NORVASC) 5 MG tablet TAKE 1 TABLET BY MOUTH  DAILY  ? aspirin EC 81 MG tablet Take 81 mg by mouth daily. Swallow whole.  ? cholecalciferol (VITAMIN D) 1000 UNITS tablet Take 1,000 Units by mouth daily.  ? dicyclomine (BENTYL) 10 MG capsule TAKE 1 CAPSULE BY MOUTH 4  TIMES DAILY AS NEEDED  ? furosemide (LASIX) 20 MG tablet TAKE 1 TABLET BY MOUTH  DAILY AS NEEDED  ? losartan (COZAAR) 50 MG tablet TAKE 1 TABLET BY MOUTH  TWICE DAILY  ? metFORMIN  (GLUCOPHAGE-XR) 500 MG 24 hr tablet TAKE 1 TABLET BY MOUTH IN  THE MORNING AND AT BEDTIME  ? metoCLOPramide (REGLAN) 5 MG tablet Take 1 tablet (5 mg total) by mouth 2 (two) times daily as needed.  ? metoprolol tartrate (LOPRESSOR) 50 MG tablet TAKE 1 AND 1/2 TABLETS BY  MOUTH TWICE DAILY  ? NEXIUM 40 MG capsule Take 1 capsule (40 mg total) by mouth daily before breakfast.  ? ondansetron (ZOFRAN) 4 MG tablet Take 4 mg by mouth every 4 (four) hours as needed. For nausea  ? potassium chloride (KLOR-CON) 10 MEQ tablet TAKE 1 TABLET BY MOUTH  DAILY  ? rosuvastatin (CRESTOR) 20 MG tablet TAKE 1 TABLET BY MOUTH  DAILY  ? thiamine (VITAMIN B-1) 100 MG tablet Take 100 mg by mouth every other day.  ? betamethasone valerate ointment (VALISONE) 0.1 % Apply qod to affected area, small amount  ? Blood Glucose Monitoring Suppl (ONE TOUCH ULTRA 2) w/Device KIT USE TO CHECK BLOOD SUGAR AS DIRECTED  ? COVID-19 At Home Antigen Test [facility] COVID-19 HOME TEST) KIT Use as directed  ? hydrALAZINE (APRESOLINE) 10 MG tablet Take 10 mg by mouth as needed. (Patient not taking: Reported on 03/17/2021)  ? Lancets (ONETOUCH ULTRASOFT) lancets USE AS DIRECTED 3 TIMES  DAILY  ? ONETOUCH ULTRA test strip CHECK BLOOD SUGAR 3 TIMES  DAILY OR AS NEEDED.  ? topiramate (TOPAMAX) 25 MG tablet Take 1 tablet (25 mg total) by mouth 2 (two) times daily. (Patient not taking: Reported on 03/17/2021)  ? ?  No facility-administered encounter medications on file as of 04/26/2021.  ? ? ?Patient Active Problem List  ? Diagnosis Date Noted  ? Oral lesion 03/08/2021  ? Sun-damaged skin 03/08/2021  ? Thiamine deficiency 11/01/2020  ? Trigeminal neuralgia 08/21/2020  ? Pedal edema 08/21/2020  ? Chronic left-sided back pain 03/28/2020  ? Nocturia 03/28/2020  ? Left-sided headache 03/28/2020  ? 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 Renal insufficiency 06/18/2019  ? Educated about COVID-19 virus infection 05/15/2019  ? Atrophic vaginitis 01/20/2019  ? Stenosis of carotid  artery 11/12/2018  ? Anxiety 10/13/2018  ? Thrush 12/07/2017  ? Sinusitis 10/11/2017  ? Headache 07/12/2017  ? Dizzy spells 11/21/2016  ? Constipation 11/21/2016  ? Nonrheumatic aortic valve stenosis 05/23/2016  ? Aortic atherosclerosis ([place]) 05/23/2016  ? Hematuria 03/16/2016  ? 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 Pain of upper abdomen 06/06/2015  ? Neck pain 04/22/2015  ? Ear pain 02/28/2015  ? Abnormal urine 11/21/2014  ? 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 Anemia 06/08/2014  ? RLS (restless legs syndrome) 11/23/2013  ? Diabetic peripheral neuropathy ([place]) 10/29/2013  ? Left-sided thoracic back pain 10/06/2013  ? Encounter for preventative adult health care exam with abnormal findings 09/14/2013  ? Iron deficiency anemia   ? Status post laparoscopy 02/25/2013  ? Hyponatremia 01/09/2013  ? GERD (gastroesophageal reflux disease) 01/09/2013  ? Amaurosis fugax of left eye 10/16/2012  ? Low back pain 06/03/2012  ? Vitamin D deficiency 03/11/2012  ? Bilateral hand pain 10/20/2011  ? Encounter for long-term (current) use of other medications 10/20/2011  ? IBS (irritable bowel syndrome) 08/14/2011  ? TIA (transient ischemic attack) 02/10/2011  ? Abnormal brain CT 01/19/2011  ? Allergic rhinitis 10/01/2010  ? 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 FUNDIC GLAND POLYPS OF STOMACH 03/18/2010  ? Abdominal pain in female 03/18/2010  ? Pain in joint 03/17/2009  ? SYSTOLIC MURMUR 80/99/8338  ? Paroxysmal supraventricular tachycardia ([place]) 01/12/2009  ? PLANTAR FASCIITIS 06/08/2008  ? CHEST PAIN 05/18/2008  ? Gastroparesis 12/18/2007  ? HYPERCHOLESTEROLEMIA 06/11/2007  ? Diabetes mellitus type 2 in obese ([place]) 09/05/2006  ? Essential hypertension 09/05/2006  ? ? ?Conditions to be addressed/monitored:HTN, HLD, DMII, and Gastroparesis/IBS ? ?Care Plan : RN Care Manager Plan of Care  ?Updates made by [name], RN since 04/26/2021 12:00 AM  ?  ? ?Problem: Chronic Disease Management Education and/or  Care Coordination needs   ?Priority: High  ?  ? ?Long-Range Goal: Development of Plan of Care for Chronic Disease Management   ?Start Date: 01/10/2021  ?Expected End Date: 10/27/2021  ?Priority: High  ?Note:   ?Current Barriers: Mrs. Townsend Roger reports she recently lost her brother last week, but would like to continue telephone visit. RNCM allowed time for Mrs. Mussalam to express her feelings. RNCM offered clinical social work referral. Mrs. Townsend Roger declines at this time. She reports her latest blood sugar was 130 before meals.  ?Knowledge Deficits related to plan of care for management of HTN and DMII  ?Limited Social Support ?Does not monitoring blood pressure ?RNCM Clinical Goal(s):  ?Patient will take all medications exactly as prescribed and will call provider for medication related questions as evidenced by self report and/or chart notation    ?demonstrate improved adherence to prescribed treatment plan for HTN and DMII as evidenced by improved daily monitoring and recording of CBG, adherence to ADA/care modified diet, increase activity, improved adherence to prescribed medication regimen, contact provider for new or worsened symptoms  or questions.  through collaboration with Consulting civil engineer, provider, and care team.  ? ?Interventions: ?1:1 collaboration with primary care provider regarding development and update of comprehensive plan of care as evidenced by provider attestation and co-signature ?Inter-disciplinary care team collaboration (see longitudinal plan of care) ?Evaluation of current treatment plan related to  self management and patient's adherence to plan as established by provider ? ?Diabetes Interventions:  (Status:  Goal on track:  Yes.) Long Term Goal ?Assessed patient's understanding of A1c goal: <7% ?Reviewed medications with patient and discussed importance of medication adherence ?Reviewed blood sugar goals with patient: FBS 80-130 and after a meal <180 ?Discussed healthy eating. Encouraged  to avoid fast foods, saturated fats, trans fats and processed food. Encouraged to include foods from Gastroparesis food list provided by gastroenterologist. ?Lab Results  ?Component Value Date  ? HGBA1C 6.2 01/24/202

## 2021-05-04 ENCOUNTER — Inpatient Hospital Stay: Payer: Medicare Other | Attending: Hematology & Oncology

## 2021-05-04 ENCOUNTER — Telehealth: Payer: Self-pay | Admitting: Family Medicine

## 2021-05-04 ENCOUNTER — Telehealth: Payer: Self-pay | Admitting: *Deleted

## 2021-05-04 ENCOUNTER — Inpatient Hospital Stay: Payer: Medicare Other | Admitting: Family

## 2021-05-04 ENCOUNTER — Encounter: Payer: Self-pay | Admitting: Family

## 2021-05-04 VITALS — BP 99/59 | HR 59 | Temp 98.9°F | Resp 18 | Wt 167.1 lb

## 2021-05-04 DIAGNOSIS — K909 Intestinal malabsorption, unspecified: Secondary | ICD-10-CM | POA: Insufficient documentation

## 2021-05-04 DIAGNOSIS — D500 Iron deficiency anemia secondary to blood loss (chronic): Secondary | ICD-10-CM

## 2021-05-04 DIAGNOSIS — D508 Other iron deficiency anemias: Secondary | ICD-10-CM | POA: Diagnosis not present

## 2021-05-04 LAB — CMP (CANCER CENTER ONLY)
ALT: 22 U/L (ref 0–44)
AST: 19 U/L (ref 15–41)
Albumin: 4.7 g/dL (ref 3.5–5.0)
Alkaline Phosphatase: 54 U/L (ref 38–126)
Anion gap: 10 (ref 5–15)
BUN: 20 mg/dL (ref 8–23)
CO2: 25 mmol/L (ref 22–32)
Calcium: 10 mg/dL (ref 8.9–10.3)
Chloride: 95 mmol/L — ABNORMAL LOW (ref 98–111)
Creatinine: 0.8 mg/dL (ref 0.44–1.00)
GFR, Estimated: >60 mL/min (ref >60)
Glucose, Bld: 192 mg/dL — ABNORMAL HIGH (ref 70–99)
Potassium: 4.6 mmol/L (ref 3.5–5.1)
Sodium: 130 mmol/L — ABNORMAL LOW (ref 135–145)
Total Bilirubin: 0.7 mg/dL (ref 0.3–1.2)
Total Protein: 7.4 g/dL (ref 6.5–8.1)

## 2021-05-04 LAB — CBC WITH DIFFERENTIAL (CANCER CENTER ONLY)
Abs Immature Granulocytes: 0.06 10*3/uL (ref 0.00–0.07)
Basophils Absolute: 0.1 10*3/uL (ref 0.0–0.1)
Basophils Relative: 1 %
Eosinophils Absolute: 0.1 10*3/uL (ref 0.0–0.5)
Eosinophils Relative: 1 %
HCT: 37.5 % (ref 36.0–46.0)
Hemoglobin: 12.6 g/dL (ref 12.0–15.0)
Immature Granulocytes: 1 %
Lymphocytes Relative: 31 %
Lymphs Abs: 2.4 10*3/uL (ref 0.7–4.0)
MCH: 28.6 pg (ref 26.0–34.0)
MCHC: 33.6 g/dL (ref 30.0–36.0)
MCV: 85.2 fL (ref 80.0–100.0)
Monocytes Absolute: 0.7 10*3/uL (ref 0.1–1.0)
Monocytes Relative: 9 %
Neutro Abs: 4.5 10*3/uL (ref 1.7–7.7)
Neutrophils Relative %: 57 %
Platelet Count: 224 10*3/uL (ref 150–400)
RBC: 4.4 MIL/uL (ref 3.87–5.11)
RDW: 11.7 % (ref 11.5–15.5)
WBC Count: 7.7 10*3/uL (ref 4.0–10.5)
nRBC: 0 % (ref 0.0–0.2)

## 2021-05-04 LAB — RETICULOCYTES
Immature Retic Fract: 4.4 % (ref 2.3–15.9)
RBC.: 4.39 MIL/uL (ref 3.87–5.11)
Retic Count, Absolute: 65.9 10*3/uL (ref 19.0–186.0)
Retic Ct Pct: 1.5 % (ref 0.4–3.1)

## 2021-05-04 NOTE — Telephone Encounter (Signed)
Pt dropped document to be filled out by provider (Disability Parking Placard) 1 page- Pt would like to be called when document ready to pick up at [facility] [phone]. Document put at front office tray under providers name.  ?

## 2021-05-04 NOTE — Telephone Encounter (Signed)
Form completed and placed in front office. Called pt, no answer but left VM informing pt. ?

## 2021-05-04 NOTE — Progress Notes (Signed)
?Hematology and Oncology Follow Up Visit ? ?[name] ?[id number] ?25-Jun-1940 81 y.o. ?05/04/2021 ? ? ?Principle Diagnosis:  ?Iron deficiency anemia secondary to malabsorption ?  ?Current Therapy:        ?IV iron as indicated ?  ?Interim History:  [name] is here today for follow-up. She is doing fairly well but notes fatigue.  ?She states that she is getting over a bout with diverticulitis and is currently taking Nexium, Reglan and Bentyl.  ?No blood loss noted. No bruising or petechiae.  ?No fever, chills, n/v, cough, rash, dizziness, SOB, chest pain, palpitations or changes in bladder habits.  ?No swelling, tenderness, numbness or tingling in her extremities.  ?No falls or syncope to report.  ?She states that her appetite is ok and she is doing her best to stay well hydrated. Her weight is stable at 167 lbs.  ? ?ECOG Performance Status: 1 - Symptomatic but completely ambulatory ? ?Medications:  ?Allergies as of 05/04/2021   ? ?   Reactions  ? Tramadol Other (See Comments)  ? Dizziness  ? ?  ? ?  ?Medication List  ?  ? ?  ? Accurate as of May 04, 2021  2:20 PM. If you have any questions, ask your nurse or doctor.  ?  ?  ? ?  ? ?acetaminophen 325 MG tablet ?Commonly known as: Tylenol ?Take 2 tablets (650 mg total) by mouth every 6 (six) hours as needed for up to 30 doses for mild pain or moderate pain. ?  ?amLODipine 5 MG tablet ?Commonly known as: NORVASC ?TAKE 1 TABLET BY MOUTH  DAILY ?  ?aspirin EC 81 MG tablet ?Take 81 mg by mouth daily. Swallow whole. ?  ?betamethasone valerate ointment 0.1 % ?Commonly known as: VALISONE ?Apply qod to affected area, small amount ?  ?Carestart COVID-19 Home Test Kit ?Generic drug: COVID-19 At Home Antigen Test ?Use as directed ?  ?cholecalciferol 1000 units tablet ?Commonly known as: VITAMIN D ?Take 1,000 Units by mouth daily. ?  ?dicyclomine 10 MG capsule ?Commonly known as: BENTYL ?TAKE 1 CAPSULE BY MOUTH 4  TIMES DAILY AS NEEDED ?  ?furosemide 20 MG  tablet ?Commonly known as: LASIX ?TAKE 1 TABLET BY MOUTH  DAILY AS NEEDED ?  ?hydrALAZINE 10 MG tablet ?Commonly known as: APRESOLINE ?Take 10 mg by mouth as needed. ?  ?losartan 50 MG tablet ?Commonly known as: COZAAR ?TAKE 1 TABLET BY MOUTH  TWICE DAILY ?  ?metFORMIN 500 MG 24 hr tablet ?Commonly known as: GLUCOPHAGE-XR ?TAKE 1 TABLET BY MOUTH IN  THE MORNING AND AT BEDTIME ?  ?metoCLOPramide 5 MG tablet ?Commonly known as: REGLAN ?Take 1 tablet (5 mg total) by mouth 2 (two) times daily as needed. ?  ?metoprolol tartrate 50 MG tablet ?Commonly known as: LOPRESSOR ?TAKE 1 AND 1/2 TABLETS BY  MOUTH TWICE DAILY ?  ?NexIUM 40 MG capsule ?Generic drug: esomeprazole ?Take 1 capsule (40 mg total) by mouth daily before breakfast. ?  ?ondansetron 4 MG tablet ?Commonly known as: ZOFRAN ?Take 4 mg by mouth every 4 (four) hours as needed. For nausea ?  ?ONE TOUCH ULTRA 2 w/Device Kit ?USE TO CHECK BLOOD SUGAR AS DIRECTED ?  ?OneTouch Ultra test strip ?Generic drug: glucose blood ?CHECK BLOOD SUGAR 3 TIMES  DAILY OR AS NEEDED. ?  ?onetouch ultrasoft lancets ?USE AS DIRECTED 3 TIMES  DAILY ?  ?potassium chloride 10 MEQ tablet ?Commonly known as: KLOR-CON M ?TAKE 1 TABLET BY MOUTH  DAILY ?  ?  rosuvastatin 20 MG tablet ?Commonly known as: CRESTOR ?TAKE 1 TABLET BY MOUTH  DAILY ?  ?thiamine 100 MG tablet ?Commonly known as: Vitamin B-1 ?Take 100 mg by mouth every other day. ?  ?topiramate 25 MG tablet ?Commonly known as: TOPAMAX ?Take 1 tablet (25 mg total) by mouth 2 (two) times daily. ?  ? ?  ? ? ?Allergies:  ?Allergies  ?Allergen Reactions  ? Tramadol Other (See Comments)  ?  Dizziness ?  ? ? ?Past Medical History, Surgical history, Social history, and Family History were reviewed and updated. ? ?Review of Systems: ?All other 10 point review of systems is negative.  ? ?Physical Exam: ? weight is 167 lb 1.9 oz (75.8 kg). Her oral temperature is 98.9 ?F (37.2 ?C). Her blood pressure is 99/59 (abnormal) and her pulse is 59  (abnormal). Her respiration is 18 and oxygen saturation is 99%.  ? ?Wt Readings from Last 3 Encounters:  ?05/04/21 167 lb 1.9 oz (75.8 kg)  ?03/08/21 169 lb (76.7 kg)  ?03/02/21 166 lb (75.3 kg)  ? ? ?Ocular: Sclerae unicteric, pupils equal, round and reactive to light ?Ear-nose-throat: Oropharynx clear, dentition fair ?Lymphatic: No cervical or supraclavicular adenopathy ?Lungs no rales or rhonchi, good excursion bilaterally ?Heart regular rate and rhythm, no murmur appreciated ?Abd soft, nontender, positive bowel sounds ?MSK no focal spinal tenderness, no joint edema ?Neuro: non-focal, well-oriented, appropriate affect ?Breasts: Deferred  ? ?Lab Results  ?Component Value Date  ? WBC 7.7 05/04/2021  ? HGB 12.6 05/04/2021  ? HCT 37.5 05/04/2021  ? MCV 85.2 05/04/2021  ? PLT 224 05/04/2021  ? ?Lab Results  ?Component Value Date  ? FERRITIN 334 (H) 03/01/2021  ? IRON 99 03/01/2021  ? TIBC 317 03/01/2021  ? UIBC 244 12/20/2020  ? IRONPCTSAT 31 03/01/2021  ? ?Lab Results  ?Component Value Date  ? RETICCTPCT 1.5 05/04/2021  ? RBC 4.39 05/04/2021  ? RETICCTABS 39.8 01/22/2015  ? ?No results found for: KPAFRELGTCHN, LAMBDASER, KAPLAMBRATIO ?Lab Results  ?Component Value Date  ? IGA 228 06/06/2011  ? ?No results found for: TOTALPROTELP, ALBUMINELP, A1GS, A2GS, BETS, BETA2SER, GAMS, MSPIKE, SPEI ?  Chemistry   ?   ?Component Value Date/Time  ? NA 135 03/01/2021 0915  ? NA 136 05/08/2016 1305  ? NA 134 (L) 07/08/2015 1149  ? K 3.9 03/01/2021 0915  ? K 4.2 05/08/2016 1305  ? K 4.4 07/08/2015 1149  ? CL 98 03/01/2021 0915  ? CL 102 05/08/2016 1305  ? CO2 28 03/01/2021 0915  ? CO2 26 05/08/2016 1305  ? CO2 25 07/08/2015 1149  ? BUN 22 03/01/2021 0915  ? BUN 16 05/08/2016 1305  ? BUN 15.5 07/08/2015 1149  ? CREATININE 0.75 03/01/2021 0915  ? CREATININE 0.85 12/20/2020 1343  ? CREATININE 0.78 11/13/2019 0906  ? CREATININE 0.8 07/08/2015 1149  ?    ?Component Value Date/Time  ? CALCIUM 9.9 03/01/2021 0915  ? CALCIUM 9.4 05/08/2016  1305  ? CALCIUM 10.0 07/08/2015 1149  ? ALKPHOS 55 03/01/2021 0915  ? ALKPHOS 56 05/08/2016 1305  ? ALKPHOS 62 07/08/2015 1149  ? AST 19 03/01/2021 0915  ? AST 16 12/20/2020 1343  ? AST 24 07/08/2015 1149  ? ALT 18 03/01/2021 0915  ? ALT 19 12/20/2020 1343  ? ALT 29 05/08/2016 1305  ? ALT 30 07/08/2015 1149  ? BILITOT 0.7 03/01/2021 0915  ? BILITOT 0.6 12/20/2020 1343  ? BILITOT 0.64 07/08/2015 1149  ?  ? ? ? ?Impression and  Plan: Ms. Mumford is a very pleasant 81 yo female with history of iron deficiency anemia secondary to malabsorption. ?Iron studies are pending.  ?Follow-up in 6 months.  ? ?Lottie Dawson, NP ?3/29/20232:20 PM ? ?

## 2021-05-05 LAB — FERRITIN: Ferritin: 456 ng/mL — ABNORMAL HIGH (ref 11–307)

## 2021-05-05 LAB — IRON AND IRON BINDING CAPACITY (CC-WL,HP ONLY)
Iron: 82 ug/dL (ref 28–170)
Saturation Ratios: 22 % (ref 10.4–31.8)
TIBC: 368 ug/dL (ref 250–450)
UIBC: 286 ug/dL (ref 148–442)

## 2021-05-06 DIAGNOSIS — E669 Obesity, unspecified: Secondary | ICD-10-CM

## 2021-05-06 DIAGNOSIS — I10 Essential (primary) hypertension: Secondary | ICD-10-CM

## 2021-05-06 DIAGNOSIS — E1169 Type 2 diabetes mellitus with other specified complication: Secondary | ICD-10-CM | POA: Diagnosis not present

## 2021-05-12 ENCOUNTER — Other Ambulatory Visit: Payer: Self-pay | Admitting: Family Medicine

## 2021-05-16 ENCOUNTER — Other Ambulatory Visit: Payer: Self-pay | Admitting: Family Medicine

## 2021-05-17 ENCOUNTER — Encounter (HOSPITAL_BASED_OUTPATIENT_CLINIC_OR_DEPARTMENT_OTHER): Payer: Self-pay

## 2021-05-17 ENCOUNTER — Ambulatory Visit (HOSPITAL_BASED_OUTPATIENT_CLINIC_OR_DEPARTMENT_OTHER)
Admission: RE | Admit: 2021-05-17 | Discharge: 2021-05-17 | Disposition: A | Discharge status: Home / Self Care | Payer: Medicare Other | Source: Ambulatory Visit | Attending: Family Medicine | Admitting: Family Medicine

## 2021-05-17 DIAGNOSIS — Z1231 Encounter for screening mammogram for malignant neoplasm of breast: Secondary | ICD-10-CM | POA: Insufficient documentation

## 2021-05-19 ENCOUNTER — Telehealth: Payer: Self-pay | Admitting: Family Medicine

## 2021-05-19 DIAGNOSIS — H5712 Ocular pain, left eye: Secondary | ICD-10-CM | POA: Diagnosis not present

## 2021-05-19 DIAGNOSIS — H35033 Hypertensive retinopathy, bilateral: Secondary | ICD-10-CM | POA: Diagnosis not present

## 2021-05-19 DIAGNOSIS — E119 Type 2 diabetes mellitus without complications: Secondary | ICD-10-CM | POA: Diagnosis not present

## 2021-05-19 DIAGNOSIS — H04123 Dry eye syndrome of bilateral lacrimal glands: Secondary | ICD-10-CM | POA: Diagnosis not present

## 2021-05-19 LAB — HM DIABETES EYE EXAM

## 2021-05-19 NOTE — Telephone Encounter (Signed)
Pt called wondering what she was sent a message about in [place] since she was having issues with her MyChart. After resolving the issue of what she was sent, she stated that she would like to receive phone calls going forward since she has had so much trouble with her MyChart.  ?

## 2021-05-19 NOTE — Telephone Encounter (Signed)
Not sure which mychart message she is referring to. Request for phone communication noted.  ?

## 2021-05-24 ENCOUNTER — Telehealth: Payer: Self-pay | Admitting: Family Medicine

## 2021-05-24 ENCOUNTER — Ambulatory Visit (INDEPENDENT_AMBULATORY_CARE_PROVIDER_SITE_OTHER): Payer: Medicare Other

## 2021-05-24 ENCOUNTER — Telehealth: Payer: Self-pay | Admitting: Neurology

## 2021-05-24 DIAGNOSIS — E1169 Type 2 diabetes mellitus with other specified complication: Secondary | ICD-10-CM

## 2021-05-24 DIAGNOSIS — I10 Essential (primary) hypertension: Secondary | ICD-10-CM

## 2021-05-24 NOTE — Patient Instructions (Addendum)
Visit Information ? ?Thank you for taking time to visit with me today. Please don't hesitate to contact me if I can be of assistance to you before our next scheduled telephone appointment. ? ?Following are the goals we discussed today:  ?Patient Goals/Self-Care Activities: ?Contact your provider for any new or worsening health concerns ?eat healthy, may need to eat small frequent meals, monitor portion sizes ?keep all doctor appointments ?check blood sugar at prescribed times and check blood sugar if you feel it is too high or too low or during times when your usual meal intake has changed ?Goals for blood sugar readings: Fasting 80-130 and after a meal <180. ?contact your RN Care Manager with care management and/or care coordination needs ? ?Our next appointment is by telephone on 07/26/21 at 10:45 am ? ?Please call the care guide team at [phone] if you need to cancel or reschedule your appointment.  ? ?If you are experiencing a Mental Health or [place] or need someone to talk to, please call the Suicide and Crisis Lifeline: 988 ?call 1-800-273-TALK (toll free, 24 hour hotline)  ? ?The patient verbalized understanding of instructions, educational materials, and care plan provided today and agreed to receive a mailed copy of patient instructions, educational materials, and care plan.  ? ?Thea Silversmith, RN, MSN, BSN, CCM ?Care Management Coordinator ?[place] ?[phone]  ?

## 2021-05-24 NOTE — Chronic Care Management (AMB) (Signed)
?Chronic Care Management  ? ?CCM RN Visit Note ? ?05/24/2021 ?Name: [name] MRN: [id number] DOB: [birth year] ? ?Subjective: ?[name] is a 81 y.o. year old female who is a primary care patient of [name], MD. The care management team was consulted for assistance with disease management and care coordination needs.   ? ?Engaged with patient by telephone for follow up visit in response to provider referral for case management and/or care coordination services.  ? ?Consent to Services:  ?The patient was given information about Chronic Care Management services, agreed to services, and gave verbal consent prior to initiation of services.  Please see initial visit note for detailed documentation.  ? ?Patient agreed to services and verbal consent obtained.  ? ?Assessment: Review of patient past medical history, allergies, medications, health status, including review of consultants reports, laboratory and other test data, was performed as part of comprehensive evaluation and provision of chronic care management services.  ? ?SDOH (Social Determinants of Health) assessments and interventions performed:   ? ?CCM Care Plan ? ?Allergies  ?Allergen Reactions  ? Tramadol Other (See Comments)  ?  Dizziness ?  ? ? ?Outpatient Encounter Medications as of 05/24/2021  ?Medication Sig  ? acetaminophen (TYLENOL) 325 MG tablet Take 2 tablets (650 mg total) by mouth every 6 (six) hours as needed for up to 30 doses for mild pain or moderate pain.  ? amLODipine (NORVASC) 5 MG tablet TAKE 1 TABLET BY MOUTH  DAILY  ? aspirin EC 81 MG tablet Take 81 mg by mouth daily. Swallow whole.  ? betamethasone valerate ointment (VALISONE) 0.1 % Apply qod to affected area, small amount  ? cholecalciferol (VITAMIN D) 1000 UNITS tablet Take 1,000 Units by mouth daily.  ? dicyclomine (BENTYL) 10 MG capsule TAKE 1 CAPSULE BY MOUTH 4  TIMES DAILY AS NEEDED  ? furosemide (LASIX) 20 MG tablet TAKE 1 TABLET BY MOUTH  DAILY AS NEEDED  ?  losartan (COZAAR) 50 MG tablet TAKE 1 TABLET BY MOUTH  TWICE DAILY  ? metFORMIN (GLUCOPHAGE-XR) 500 MG 24 hr tablet TAKE 1 TABLET BY MOUTH IN  THE MORNING AND AT BEDTIME  ? metoCLOPramide (REGLAN) 5 MG tablet Take 1 tablet (5 mg total) by mouth 2 (two) times daily as needed.  ? metoprolol tartrate (LOPRESSOR) 50 MG tablet TAKE 1 AND 1/2 TABLETS BY  MOUTH TWICE DAILY  ? NEXIUM 40 MG capsule Take 1 capsule (40 mg total) by mouth daily before breakfast.  ? ondansetron (ZOFRAN) 4 MG tablet Take 4 mg by mouth every 4 (four) hours as needed. For nausea  ? potassium chloride (KLOR-CON M) 10 MEQ tablet TAKE 1 TABLET BY MOUTH  DAILY  ? rosuvastatin (CRESTOR) 20 MG tablet TAKE 1 TABLET BY MOUTH  DAILY  ? thiamine (VITAMIN B-1) 100 MG tablet Take 100 mg by mouth every other day.  ? Blood Glucose Monitoring Suppl (ONE TOUCH ULTRA 2) w/Device KIT USE TO CHECK BLOOD SUGAR AS DIRECTED  ? COVID-19 At Home Antigen Test [facility] COVID-19 HOME TEST) KIT Use as directed  ? hydrALAZINE (APRESOLINE) 10 MG tablet Take 10 mg by mouth as needed. (Patient not taking: Reported on 03/17/2021)  ? Lancets (ONETOUCH ULTRASOFT) lancets USE AS DIRECTED 3 TIMES  DAILY  ? ONETOUCH ULTRA test strip CHECK BLOOD SUGAR 3 TIMES  DAILY OR AS NEEDED  ? topiramate (TOPAMAX) 25 MG tablet Take 1 tablet (25 mg total) by mouth 2 (two) times daily. (Patient not taking: Reported on  03/17/2021)  ? ?No facility-administered encounter medications on file as of 05/24/2021.  ? ? ?Patient Active Problem List  ? Diagnosis Date Noted  ? Oral lesion 03/08/2021  ? Sun-damaged skin 03/08/2021  ? Thiamine deficiency 11/01/2020  ? Trigeminal neuralgia 08/21/2020  ? Pedal edema 08/21/2020  ? Chronic left-sided back pain 03/28/2020  ? Nocturia 03/28/2020  ? Left-sided headache 03/28/2020  ? 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 Renal insufficiency 06/18/2019  ? Educated about COVID-19 virus infection 05/15/2019  ? Atrophic vaginitis 01/20/2019  ? Stenosis of carotid artery  11/12/2018  ? Anxiety 10/13/2018  ? Thrush 12/07/2017  ? Sinusitis 10/11/2017  ? Headache 07/12/2017  ? Dizzy spells 11/21/2016  ? Constipation 11/21/2016  ? Nonrheumatic aortic valve stenosis 05/23/2016  ? Aortic atherosclerosis ([place]) 05/23/2016  ? Hematuria 03/16/2016  ? 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 Pain of upper abdomen 06/06/2015  ? Neck pain 04/22/2015  ? Ear pain 02/28/2015  ? Abnormal urine 11/21/2014  ? 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 Anemia 06/08/2014  ? RLS (restless legs syndrome) 11/23/2013  ? Diabetic peripheral neuropathy ([place]) 10/29/2013  ? Left-sided thoracic back pain 10/06/2013  ? Encounter for preventative adult health care exam with abnormal findings 09/14/2013  ? Iron deficiency anemia   ? Status post laparoscopy 02/25/2013  ? Hyponatremia 01/09/2013  ? GERD (gastroesophageal reflux disease) 01/09/2013  ? Amaurosis fugax of left eye 10/16/2012  ? Low back pain 06/03/2012  ? Vitamin D deficiency 03/11/2012  ? Bilateral hand pain 10/20/2011  ? Encounter for long-term (current) use of other medications 10/20/2011  ? IBS (irritable bowel syndrome) 08/14/2011  ? TIA (transient ischemic attack) 02/10/2011  ? Abnormal brain CT 01/19/2011  ? Allergic rhinitis 10/01/2010  ? 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 FUNDIC GLAND POLYPS OF STOMACH 03/18/2010  ? Abdominal pain in female 03/18/2010  ? Pain in joint 03/17/2009  ? SYSTOLIC MURMUR 68/34/1962  ? Paroxysmal supraventricular tachycardia ([place]) 01/12/2009  ? PLANTAR FASCIITIS 06/08/2008  ? CHEST PAIN 05/18/2008  ? Gastroparesis 12/18/2007  ? HYPERCHOLESTEROLEMIA 06/11/2007  ? Diabetes mellitus type 2 in obese ([place]) 09/05/2006  ? Essential hypertension 09/05/2006  ? ? ?Conditions to be addressed/monitored:HTN, HLD, DMII, and Gastroparesis/IBS ? ?Care Plan : RN Care Manager Plan of Care  ?Updates made by [name], RN since 05/24/2021 12:00 AM  ?  ? ?Problem: Chronic Disease Management Education and/or Care  Coordination needs   ?Priority: High  ?  ? ?Long-Range Goal: Development of Plan of Care for Chronic Disease Management   ?Start Date: 01/10/2021  ?Expected End Date: 10/27/2021  ?Priority: High  ?Note:   ?Current Barriers: Mrs. Townsend Roger reports she recently lost her brother last week, but would like to continue telephone visit. RNCM allowed time for Mrs. Mussalam to express her feelings. RNCM offered clinical social work referral. Mrs. Townsend Roger declines at this time. She reports her latest blood sugar was 130 before meals.  ?Knowledge Deficits related to plan of care for management of HTN and DMII  ?Limited Social Support ?Does not monitoring blood pressure ?04/26/21 Mrs. Mussalam reports she recently lost her brother last week, but would like to continue telephone visit. RNCM allowed time for Mrs. Mussalam to express her feelings. RNCM offered clinical social work referral. Mrs. Townsend Roger declines at this time. She reports her latest blood sugar was 130 before meals.  ?05/24/21 Mrs. Mussalam states she continues to have issues with pain at temple. Has been reluctant to try  Topiramate, but spoke with Dr. Clydene Fake office staff today and will begin taking Topiramate tonight. She also reports she has not seen ENT provider. She states that was previously referred 03/09/21, but canceled the appointment. She states she is still having issues with sinus, cold like symptoms, runny nose  temple pain, and would like another referral to a different ENT group. She states she does not have lesions anymore. She reports blood sugar not checked today, but was 130 on yesterday. ?RNCM Clinical Goal(s):  ?Patient will take all medications exactly as prescribed and will call provider for medication related questions as evidenced by self report and/or chart notation    ?demonstrate improved adherence to prescribed treatment plan for HTN and DMII as evidenced by improved daily monitoring and recording of CBG, adherence to ADA/care modified  diet, increase activity, improved adherence to prescribed medication regimen, contact provider for new or worsened symptoms or questions.  through collaboration with Consulting civil engineer, provider, and care team.  ? ?I

## 2021-05-24 NOTE — Telephone Encounter (Signed)
I spoke to the patient. She was last seen 03/02/21. Topiramate '25mg'$ , one tab twice daily was prescribed to help her with left face and arm paresthesias. She has never tried the medication because she was fearful of the potential side effects. She would like to try it now but wants to take it slow. She will start out by taking topiramate '25mg'$ , one tab QHS x one week then increase to the ordered BID dosing. She will call our office with any further concerns.  ?

## 2021-05-24 NOTE — Telephone Encounter (Signed)
Pt concerned  about topiramate (TOPAMAX) 25 MG tablet ?Pt states she believes it is not the right medication for her, she would like to know exactly what medication is supposed to help with. ?Would like a call back.  ?

## 2021-05-24 NOTE — Telephone Encounter (Signed)
Pt called to go over mammogram results. Consulted with [facility] and found that Dr. Charlett Blake has not resulted it yet. Advised pt of the situation and that Dr. Charlett Blake will go over that with her after it has been resulted. Pt then stated "I am not a number, I am a human being" expressing dissatisfaction with it being a week since the scan was taken. Advised that this issue would be marked down as high priority to ensure Dr. Charlett Blake sees it. Please Advise. ?

## 2021-05-25 ENCOUNTER — Other Ambulatory Visit: Payer: Self-pay | Admitting: Cardiology

## 2021-05-25 NOTE — Telephone Encounter (Signed)
After some investigation the mammogram was not forwarded to Dr. Charlett Blake to review as it came back normal and a letter was sent to pt. Explained this to pt and she expressed understanding.  ?

## 2021-05-31 ENCOUNTER — Other Ambulatory Visit (INDEPENDENT_AMBULATORY_CARE_PROVIDER_SITE_OTHER): Payer: Medicare Other

## 2021-05-31 DIAGNOSIS — I10 Essential (primary) hypertension: Secondary | ICD-10-CM | POA: Diagnosis not present

## 2021-05-31 DIAGNOSIS — D500 Iron deficiency anemia secondary to blood loss (chronic): Secondary | ICD-10-CM

## 2021-05-31 DIAGNOSIS — E559 Vitamin D deficiency, unspecified: Secondary | ICD-10-CM

## 2021-05-31 DIAGNOSIS — E649 Sequelae of unspecified nutritional deficiency: Secondary | ICD-10-CM | POA: Diagnosis not present

## 2021-05-31 DIAGNOSIS — R739 Hyperglycemia, unspecified: Secondary | ICD-10-CM | POA: Diagnosis not present

## 2021-05-31 DIAGNOSIS — E519 Thiamine deficiency, unspecified: Secondary | ICD-10-CM | POA: Diagnosis not present

## 2021-05-31 DIAGNOSIS — E568 Deficiency of other vitamins: Secondary | ICD-10-CM | POA: Diagnosis not present

## 2021-05-31 DIAGNOSIS — E7800 Pure hypercholesterolemia, unspecified: Secondary | ICD-10-CM

## 2021-05-31 DIAGNOSIS — E569 Vitamin deficiency, unspecified: Secondary | ICD-10-CM | POA: Diagnosis not present

## 2021-05-31 LAB — COMPREHENSIVE METABOLIC PANEL
ALT: 19 U/L (ref 0–35)
AST: 19 U/L (ref 0–37)
Albumin: 4.4 g/dL (ref 3.5–5.2)
Alkaline Phosphatase: 55 U/L (ref 39–117)
BUN: 26 mg/dL — ABNORMAL HIGH (ref 6–23)
CO2: 25 mEq/L (ref 19–32)
Calcium: 9.8 mg/dL (ref 8.4–10.5)
Chloride: 100 mEq/L (ref 96–112)
Creatinine, Ser: 0.77 mg/dL (ref 0.40–1.20)
GFR: 72.98 mL/min (ref >60.00)
Glucose, Bld: 122 mg/dL — ABNORMAL HIGH (ref 70–99)
Potassium: 4.6 mEq/L (ref 3.5–5.1)
Sodium: 135 mEq/L (ref 135–145)
Total Bilirubin: 0.6 mg/dL (ref 0.2–1.2)
Total Protein: 6.8 g/dL (ref 6.0–8.3)

## 2021-05-31 LAB — IRON,TIBC AND FERRITIN PANEL
%SAT: 26 % (calc) (ref 16–45)
Ferritin: 285 ng/mL (ref 16–288)
Iron: 82 ug/dL (ref 45–160)
TIBC: 320 mcg/dL (calc) (ref 250–450)

## 2021-05-31 LAB — CBC
HCT: 38.2 % (ref 36.0–46.0)
Hemoglobin: 12.7 g/dL (ref 12.0–15.0)
MCHC: 33.3 g/dL (ref 30.0–36.0)
MCV: 85.6 fl (ref 78.0–100.0)
Platelets: 207 10*3/uL (ref 150.0–400.0)
RBC: 4.46 Mil/uL (ref 3.87–5.11)
RDW: 12.8 % (ref 11.5–15.5)
WBC: 8.3 10*3/uL (ref 4.0–10.5)

## 2021-05-31 LAB — LIPID PANEL
Cholesterol: 178 mg/dL (ref 0–200)
HDL: 65.7 mg/dL (ref >39.00)
LDL Cholesterol: 88 mg/dL (ref 0–99)
NonHDL: 112.66
Total CHOL/HDL Ratio: 3
Triglycerides: 122 mg/dL (ref 0.0–149.0)
VLDL: 24.4 mg/dL (ref 0.0–40.0)

## 2021-05-31 LAB — HEMOGLOBIN A1C: Hgb A1c MFr Bld: 6.6 % — ABNORMAL HIGH (ref 4.6–6.5)

## 2021-05-31 LAB — VITAMIN D 25 HYDROXY (VIT D DEFICIENCY, FRACTURES): VITD: 42.22 ng/mL (ref 30.00–100.00)

## 2021-05-31 LAB — TSH: TSH: 3.13 u[IU]/mL (ref 0.35–5.50)

## 2021-06-03 ENCOUNTER — Ambulatory Visit: Payer: Medicare Other | Admitting: Pharmacist

## 2021-06-03 DIAGNOSIS — I10 Essential (primary) hypertension: Secondary | ICD-10-CM

## 2021-06-03 DIAGNOSIS — E7800 Pure hypercholesterolemia, unspecified: Secondary | ICD-10-CM

## 2021-06-03 DIAGNOSIS — G500 Trigeminal neuralgia: Secondary | ICD-10-CM

## 2021-06-03 DIAGNOSIS — E1169 Type 2 diabetes mellitus with other specified complication: Secondary | ICD-10-CM

## 2021-06-03 DIAGNOSIS — E519 Thiamine deficiency, unspecified: Secondary | ICD-10-CM

## 2021-06-03 NOTE — Chronic Care Management (AMB) (Signed)
? ? ?Chronic Care Management ?Pharmacy Note ? ?06/06/2021 ?Name:  [name] MRN:  [id number] DOB:  04/17/1940 ? ?Summary:  ?Per Dr Leonie Man LDL gaol < 70. Last LDL decreased from 121 to 88. She has been working on lowering intake of high fat foods. Patient reports she is taking rosuvastatin 56m daily but has not filled since December 2022 and per [facility] there is not an active Rx on file. Updated prescription for rosuvastatin sent to mail order. Discussed adherence with patient ?Patient's A1c increased to 6.6% when checked 05/31/2021. She states when she was using Continuous Glucose Monitor in past it was easier for her to monitor blood glucose and effects of food on her blood glucose. She is taking metformin ER 5011mtwice a day. [facility] might covered Continuous Glucose Monitor without insulin therapy now. Sent Rx through [facility] see if will be covered.  ?Recommended increase physical activity. Start walking daily - 5 to 10 minues and increase as able; Continue stretching exercises recommended by Dr SeLeonie ManAlso discussed patient's concerns about topiramate for neuralgia. Patient has declined to take topiramate. DeArtis Flocks aware.  She also mentioned that her left breast was burning from time to time. She has a mammogram 05/17/2021 which was normal but she does have dense breast noted. Patient to discuss further with PCP and Dr SeLeonie Mansince burning is on same side has her neuralgia of the face - could it be related?) ?Patient requested copy of recent labs be mailed to her (she is having difficulty accessing MyChart)  ? ?Plan: follow up in 2 to 3 months. ? ? ?Subjective: ?[name] is an 89.o. year old female who is a primary patient of BlMosie LukesMD.  The CCM team was consulted for assistance with disease management and care coordination needs.   ? ?Engaged with patient by telephone for follow up visit in response to provider referral for pharmacy case  management and/or care coordination services.  ? ?Consent to Services:  ?The patient was given information about Chronic Care Management services, agreed to services, and gave verbal consent prior to initiation of services.  Please see initial visit note for detailed documentation.  ? ?Patient Care Team: ?BlMosie LukesMD as PCP - General (Family Medicine) ?HoMinus BreedingMD as PCP - Cardiology (Cardiology) ?GeGatha MayerMD as Consulting Physician (Gastroenterology) ?LoLennon AlstromMD (Neurology) ?JaPieter PartridgeDO as Consulting Physician (Neurology) ?WaLuretha RuedRN as Case Manager ?EcCherre RobinsRPH-CPP (Pharmacist) ? ?Recent office visits:  ?03/08/2021 - PCP (Dr BlCharlett BlakeGeneral check up. Labs ordered. No medication changes noted. Referred to ENT and dermatology ?02/15/2021 - Fam Med (Dr WeNani RavensAcute visit for sore throat ?12/16/2020 - Fam Med (MValere DrossNP). seen for lower quadrant pain on left side with urinary frequency. Prescribed Augmentin 87520mwice a day ? ?  ?Recent consult visits:  ?05/24/2021 - Neurology (Dr SetLeonie Manhone Call. patient concerned about topiramate. Patient advised to take 58m89mce at bedtime for 1 week, then increase to twice a day. ?05/04/2021 - Hematology (CarEulas Post) Seen for Iron deficiency anemia secondary to malabsorption ?03/02/2021- Neurology (Dr SethLeonie Maneen for nubness of unclear etiology possibly poststroke paresthesias, cervical radiculopathy. Recommended continue ASA for stroke prevention. LDL <70 and A1c < 6.5. Prescribed topiramate 58mg9mly for parestesia. F/U in 3 months ?01/07/2021 - GI (Dr GeeenJilda Panda gastroparesis, diverticulitis and IBS. No medication changes. Follow up as needed.  ?12/20/2020 - Hematology (CartEulas Post  NP) F/U iron deficiency anemia. Labs checked ? ?Hospital visits:   ?None in last 6 months ? ?Objective: ? ?Lab Results  ?Component Value Date  ? CREATININE 0.77 05/31/2021  ? CREATININE 0.80 05/04/2021  ? CREATININE 0.75 03/01/2021   ? ? ?Lab Results  ?Component Value Date  ? HGBA1C 6.6 (H) 05/31/2021  ? ?Last diabetic Eye exam:  ?Lab Results  ?Component Value Date/Time  ? HMDIABEYEEXA No Retinopathy 05/19/2021 12:00 AM  ?  ?Last diabetic Foot exam: No results found for: HMDIABFOOTEX  ? ?   ?Component Value Date/Time  ? CHOL 178 05/31/2021 0920  ? TRIG 122.0 05/31/2021 0920  ? TRIG 94 11/16/2005 0905  ? HDL 65.70 05/31/2021 0920  ? CHOLHDL 3 05/31/2021 0920  ? VLDL 24.4 05/31/2021 0920  ? [place] 88 05/31/2021 0920  ? LDLCALC 116 (H) 11/13/2019 7026  ? LDLDIRECT 95.0 03/25/2020 1149  ? ? ? ?  Latest Ref Rng & Units 05/31/2021  ?  9:20 AM 05/04/2021  ?  1:45 PM 03/01/2021  ?  9:15 AM  ?Hepatic Function  ?Total Protein 6.0 - 8.3 g/dL 6.8   7.4   7.0    ?Albumin 3.5 - 5.2 g/dL 4.4   4.7   4.5    ?AST 0 - 37 U/L _0 ?ALT 0 - 35 U/L _1 ?Alk Phosphatase 39 - 117 U/L 55   54   55    ?Total Bilirubin 0.2 - 1.2 mg/dL 0.6   0.7   0.7    ? ? ?Lab Results  ?Component Value Date/Time  ? TSH 3.13 05/31/2021 09:20 AM  ? TSH 2.80 03/01/2021 09:15 AM  ? FREET4 0.97 08/08/2019 11:57 AM  ? ? ? ?  Latest Ref Rng & Units 05/31/2021  ?  9:20 AM 05/04/2021  ?  1:45 PM 03/01/2021  ?  9:15 AM  ?CBC  ?WBC 4.0 - 10.5 K/uL 8.3   7.7   6.8    ?Hemoglobin 12.0 - 15.0 g/dL 12.7   12.6   12.7    ?Hematocrit 36.0 - 46.0 % 38.2   37.5   38.8    ?Platelets 150.0 - 400.0 K/uL 207.0   224   206.0    ? ? ?Lab Results  ?Component Value Date/Time  ? VD25OH 42.22 05/31/2021 09:20 AM  ? VD25OH 48.67 03/01/2021 09:15 AM  ? ? ?Clinical ASCVD: Yes  ?The ASCVD Risk score (Arnett DK, et al., 2019) failed to calculate for the following reasons: ?  The 2019 ASCVD risk score is only valid for ages 19 to 15   ? ?Other:  ? ?DEXA 04/26/2020: ?Femur Neck Right  T-score +0.6. ?L3 was excluded due to degenerative changes  ?AP Spine L1-L4 (L3)  Normal +1.9  ?DualFemur Neck Right Normal +0.6  ? ?Social History  ? ?Tobacco Use  ?Smoking Status Never  ?Smokeless Tobacco Never   ?Tobacco Comments  ? Never used tobacco  ? ?BP Readings from Last 3 Encounters:  ?05/04/21 (!) 99/59  ?03/08/21 104/60  ?03/02/21 (!) 144/80  ? ?Pulse Readings from Last 3 Encounters:  ?05/04/21 (!) 59  ?03/08/21 65  ?03/02/21 70  ? ?Wt Readings from Last 3 Encounters:  ?05/04/21 167 lb 1.9 oz (75.8 kg)  ?03/08/21 169 lb (76.7 kg)  ?03/02/21 166 lb (75.3 kg)  ? ? ?Assessment: Review of patient past medical history, allergies, medications, health status, including  review of consultants reports, laboratory and other test data, was performed as part of comprehensive evaluation and provision of chronic care management services.  ? ?SDOH:  (Social Determinants of Health) assessments and interventions performed:  ? ? ? ? ?Start ? ?Allergies  ?Allergen Reactions  ? Tramadol Other (See Comments)  ?  Dizziness ?  ? ? ?Medications Reviewed Today   ? ? Reviewed by [name], RPH-CPP (Pharmacist) on 06/03/21 at 1352  Med List Status: <None>  ? ?Medication Order Taking? Sig Documenting Provider Last Dose Status Informant  ?acetaminophen (TYLENOL) 325 MG tablet [id number] Yes Take 2 tablets (650 mg total) by mouth every 6 (six) hours as needed for up to 30 doses for mild pain or moderate pain. Wyvonnia Dusky, MD Taking Active   ?amLODipine (NORVASC) 5 MG tablet [id number] Yes TAKE 1 TABLET BY MOUTH  DAILY Minus Breeding, MD Taking Active   ?aspirin EC 81 MG tablet [id number] Yes Take 81 mg by mouth daily. Swallow whole. [provider] Taking Active Self  ?betamethasone valerate ointment (VALISONE) 0.1 % [id number] Yes Apply qod to affected area, small amount [name], MD Taking Active   ?Blood Glucose Monitoring Suppl (ONE TOUCH ULTRA 2) w/Device KIT [id number] Yes USE TO CHECK BLOOD SUGAR AS DIRECTED [name], MD Taking Active   ?cholecalciferol (VITAMIN D) 1000 UNITS tablet [id number] Yes Take 1,000 Units by mouth daily. [provider] Taking Active Self  ?COVID-19 At Home Antigen  Test [facility] COVID-19 HOME TEST) KIT [id number] Yes Use as directed Clementeen Graham, [facility] Taking Active   ?dicyclomine (BENTYL) 10 MG capsule [id number] Yes TAKE 1 CAPSULE BY MOUTH 4  TIMES DAILY AS NEEDED Silvano Rusk

## 2021-06-04 ENCOUNTER — Other Ambulatory Visit: Payer: Self-pay | Admitting: Family Medicine

## 2021-06-05 DIAGNOSIS — E669 Obesity, unspecified: Secondary | ICD-10-CM

## 2021-06-05 DIAGNOSIS — I10 Essential (primary) hypertension: Secondary | ICD-10-CM

## 2021-06-05 DIAGNOSIS — E1169 Type 2 diabetes mellitus with other specified complication: Secondary | ICD-10-CM | POA: Diagnosis not present

## 2021-06-05 DIAGNOSIS — E7800 Pure hypercholesterolemia, unspecified: Secondary | ICD-10-CM | POA: Diagnosis not present

## 2021-06-05 LAB — VITAMIN B1: Vitamin B1 (Thiamine): 19 nmol/L (ref 8–30)

## 2021-06-06 NOTE — Patient Instructions (Signed)
[name] ?It was a pleasure speaking with you  ?Below is a summary of your health goals and care plan ? ?If you have any questions or concerns, please feel free to contact me either at the phone number below or with a MyChart message.  ? ?Keep up the good work! ? ?[name], PharmD ?Clinical Pharmacist ?[name] ?[place] ?[phone] (direct line)  ?[phone] (main office number) ? ?Diabetes: ?A1c is at goal of <7.0% however patient reports home blood glucose has not been at goal ?Lab Results  ?Component Value Date  ? HGBA1C 6.6 (H) 05/31/2021  ? ?Current treatment: ?Metformin ER '500mg'$  - take 2 tablets daily  ?Interventions:  ?Eat a small snack prior to bedtime to prevent low blood glucose during sleep ?Education provided about recognition and treatment of hypoglycemiz ?If you have a low blood sugar less than 70, please eat 15 grams of carbohydrates (4 oz of juice, soda, 4 glucose tablets, or 3-4 pieces of hard candy). Wait 15 minutes and then recheck your blood sugar. If your sugar is still less than 70, eat another 15 grams of carbohydrates. Wait another 15 minutes and recheck your glucose. Continue this until your sugar is over 70. Then, eat a snack like crackers with peanut butter, apply with peanut butter or yogurt to prevent your sugar from dropping again. ?Reviewed home blood glucose readings and reviewed goals  ?Fasting blood glucose goal (before meals) = 80 to 130 ?Blood glucose goal after a meal = less than 180  ? ?Hypertension / Paroxysmal SVT: ?Controlled per last office visits but patient states blood pressure at home has been elevated ?Blood pressure goal < 140/90 ? ?BP Readings from Last 3 Encounters:  ?05/04/21 (!) 99/59  ?03/08/21 104/60  ?03/02/21 (!) 144/80  ? ?Current treatment: ?Metoprolol '50mg'$  twice a day ?Amlodipine '5mg'$  daily in evening ?Hydralazine '10mg'$  as needed for elevated blood pressure (has not taken recently)  ?Losartan '50mg'$  twice a day   ?Interventions:  ?Reviewed blood pressure goals ?Continue to check blood pressure at home once a day ?Reminded patient to make sure she has sat / rested for at least 5 mintues before checking blood pressure at home.  ?Continue current blood pressure medications ? ?Hyperlipidemia / history of TIA / stenosis of carotid artery: ?Uncontrolled LDL goal < 70 ?Lab Results  ?Component Value Date  ? CHOL 178 05/31/2021  ? CHOL 205 (H) 03/01/2021  ? CHOL 190 11/01/2020  ? ?Lab Results  ?Component Value Date  ? HDL 65.70 05/31/2021  ? HDL 63.80 03/01/2021  ? HDL 65.50 11/01/2020  ? ?Lab Results  ?Component Value Date  ? [place] 88 05/31/2021  ? LDLCALC 112 (H) 03/01/2021  ? [place] 84 11/01/2020  ? ? ?Current treatment:  ?Rosuvastatin '20mg'$  daily ?Aspirin '81mg'$  daily  ?Interventions:  ?Recommended limit intake of saturated and trans fat.  Follow heart health diet ? ? ?Gastroparesis / Irritable Bowel Syndrome / Acid reflux:  ?Goal: decrease acid reflux symptoms and improve abdominal discomfort ?Current treatment:  ?Ondansetron '4mg'$  - take 1 tablet every 4 hour as needed for nausea ?Dicyclomine '10mg'$  up to 4 times a day as needed (taking less than daily)  ?Esomeprazole '40mg'$  daily before breakfast (not taking regularly) ?Metoclopramide '5mg'$  twice a day ?Interventions:  ?Eat small frequent meals due to gastroparesis ?Can take esomeprazole 20 or '40mg'$  over the counter for acid reflux ?Also can take pepcid / famotidine '20mg'$  at night if needed for acid reflux ? ?Neuropathy / trigeminal neuralgia:  ?  Goal: decrease nerve / facial pain while minimizing side effects ?Current treatment:  ?none ?Interventions:  ?Continue to do stretching exercises recommended by Dr Leonie Man  ? ?Anemia / Low B1 (thiamin):  ?Last B1 was back to normal at 14 (03/01/2021) ?Patient reports she was only taking thiamin / B1 '100mg'$  every other day  ?Interventions:  ? Continue to take thiamine at current dose ? Recheck thiamine level in 6 to 12 months - was checked  05/31/2021 but results are still pending ? ?Health Maintenance:  ?Reviewed vaccination history and discussed benefits of Shingrix vaccination ?Plan to get Shingrix series in 2023.  ? ?Patient Goals/Self-Care Activities ?Over the next 90 days, patient will:  ?take medications as prescribed,  ?check glucose daily , document, and provide at future appointments,  ?check blood pressure daily, document, and provide at future appointments ?Start walking daily - 5 to 10 minues and increase as able; Continue stretching exercises recommended by Dr Leonie Man ? ?The patient verbalized understanding of instructions, educational materials, and care plan provided today and agreed to receive a mailed copy of patient instructions, educational materials, and care plan.   ?

## 2021-06-07 ENCOUNTER — Ambulatory Visit (INDEPENDENT_AMBULATORY_CARE_PROVIDER_SITE_OTHER): Payer: Medicare Other

## 2021-06-07 DIAGNOSIS — Z0000 Encounter for general adult medical examination without abnormal findings: Secondary | ICD-10-CM

## 2021-06-07 NOTE — Progress Notes (Signed)
? ?Subjective:  ? [name] is a 81 y.o. female who presents for Medicare Annual (Subsequent) preventive examination. ? ?I connected with  [name] on 06/07/21 by a audio enabled telemedicine application and verified that I am speaking with the correct person using two identifiers. ? ?Patient Location: Home ? ?Provider Location: Office/Clinic ? ?I discussed the limitations of evaluation and management by telemedicine. The patient expressed understanding and agreed to proceed.  ? ?Review of Systems    ? ?Cardiac Risk Factors include: advanced age (>33mn, >>60women);diabetes mellitus;hypertension;dyslipidemia ? ?   ?Objective:  ?  ?There were no vitals filed for this visit. ?There is no height or weight on file to calculate BMI. ? ? ?  06/07/2021  ?  1:02 PM 05/04/2021  ?  2:06 PM 12/20/2020  ?  3:17 PM 08/03/2020  ? 10:54 AM 08/03/2020  ?  6:58 AM 06/16/2020  ?  1:38 PM 03/19/2020  ?  1:38 PM  ?Advanced Directives  ?Does Patient Have a Medical Advance Directive? No No No No No No No  ?Would patient like information on creating a medical advance directive? No - Patient declined No - Patient declined No - Patient declined   No - Patient declined No - Patient declined  ? ? ?Current Medications (verified) ?Outpatient Encounter Medications as of 06/07/2021  ?Medication Sig  ? acetaminophen (TYLENOL) 325 MG tablet Take 2 tablets (650 mg total) by mouth every 6 (six) hours as needed for up to 30 doses for mild pain or moderate pain.  ? amLODipine (NORVASC) 5 MG tablet TAKE 1 TABLET BY MOUTH  DAILY  ? aspirin EC 81 MG tablet Take 81 mg by mouth daily. Swallow whole.  ? betamethasone valerate ointment (VALISONE) 0.1 % Apply qod to affected area, small amount  ? Blood Glucose Monitoring Suppl (ONE TOUCH ULTRA 2) w/Device KIT USE TO CHECK BLOOD SUGAR AS DIRECTED  ? cholecalciferol (VITAMIN D) 1000 UNITS tablet Take 1,000 Units by mouth daily.  ? COVID-19 At Home Antigen Test (CARESTART COVID-19 HOME TEST) KIT Use  as directed  ? dicyclomine (BENTYL) 10 MG capsule TAKE 1 CAPSULE BY MOUTH 4  TIMES DAILY AS NEEDED  ? furosemide (LASIX) 20 MG tablet TAKE 1 TABLET BY MOUTH  DAILY AS NEEDED  ? hydrALAZINE (APRESOLINE) 10 MG tablet Take 10 mg by mouth as needed.  ? Lancets (ONETOUCH ULTRASOFT) lancets USE AS DIRECTED 3 TIMES  DAILY  ? losartan (COZAAR) 50 MG tablet TAKE 1 TABLET BY MOUTH TWICE  DAILY  ? metFORMIN (GLUCOPHAGE-XR) 500 MG 24 hr tablet TAKE 1 TABLET BY MOUTH IN  THE MORNING AND AT BEDTIME  ? metoCLOPramide (REGLAN) 5 MG tablet Take 1 tablet (5 mg total) by mouth 2 (two) times daily as needed.  ? metoprolol tartrate (LOPRESSOR) 50 MG tablet TAKE 1 AND 1/2 TABLETS BY  MOUTH TWICE DAILY  ? NEXIUM 40 MG capsule Take 1 capsule (40 mg total) by mouth daily before breakfast.  ? ondansetron (ZOFRAN) 4 MG tablet Take 4 mg by mouth every 4 (four) hours as needed. For nausea  ? ONETOUCH ULTRA test strip CHECK BLOOD SUGAR 3 TIMES  DAILY OR AS NEEDED  ? potassium chloride (KLOR-CON M) 10 MEQ tablet TAKE 1 TABLET BY MOUTH  DAILY  ? rosuvastatin (CRESTOR) 20 MG tablet TAKE 1 TABLET BY MOUTH  DAILY  ? thiamine (VITAMIN B-1) 100 MG tablet Take 100 mg by mouth every other day.  ? topiramate (TOPAMAX) 25 MG tablet  Take 1 tablet (25 mg total) by mouth 2 (two) times daily.  ? ?No facility-administered encounter medications on file as of 06/07/2021.  ? ? ?Allergies (verified) ?Tramadol  ? ?History: ?Past Medical History:  ?Diagnosis Date  ? Abdominal pain in female 03/18/2010  ? Qualifier: Diagnosis of  By: Carlean Purl MD, Dimas Millin   ? Anemia 06/08/2014  ? Anxiety   ? Arthritis   ? Spinal Osteoarthritis  ? Cancer [facility])   ? Carcinoid tumor of stomach   ? Cataract   ? Chest pain   ? Myoview 12/15 no ischemia.  ? Chronic kidney disease   ? Left kidney smaller than right kidney  ? Constipation 11/21/2016  ? 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 Qualifier: Diagnosis of  By: Reatha Armour, Lucy    ? Diabetic peripheral neuropathy ([place])  10/29/2013  ? Encounter for preventative adult health care exam with abnormal findings 09/14/2013  ? Esophageal reflux   ? Gastric polyp   ? Fundic Gland  ? Gastroparesis   ? Headache(784.0)   ? Heart murmur   ? Echocardiogram 2/11: EF 60-65%, mild LAE, grade 1 diastolic dysfunction, aortic valve sclerosis, mean gradient 9 mm of mercury, PASP 34  ? Hematuria 03/16/2016  ? Iron deficiency anemia, unspecified   ? 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 Leg swelling   ? bilateral  ? Neck pain 04/22/2015  ? PONV (postoperative nausea and vomiting)   ? pt states only needs small amount of anesthesia  ? PSVT (paroxysmal supraventricular tachycardia) ([place])   ? Pure hypercholesterolemia   ? 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 Renal insufficiency 06/18/2019  ? Stroke [facility])   ? tia, 2014  ? 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 Type II or unspecified type diabetes mellitus without mention of complication, not stated as uncontrolled   ? Unspecified essential hypertension   ? Unspecified hereditary and idiopathic peripheral neuropathy 10/29/2013  ? ?Past Surgical History:  ?Procedure Laterality Date  ? CHOLECYSTECTOMY  1993  ? COLONOSCOPY  11/11/2010  ? diverticulosis  ? DILATATION & CURRETTAGE/HYSTEROSCOPY WITH RESECTOCOPE N/A 02/25/2013  ? Procedure: Attempted hysteroscopy with uterine perforation;  Surgeon: Jamey Reas de Berton Lan, MD;  Location: [facility];  Service: Gynecology;  Laterality: N/A;  ? ESOPHAGOGASTRODUODENOSCOPY  08/29/2010; 09/15/2010  ? Carcinoid tumor less than 1 cm in July 2012 not seen in August 2012 , gastritis, fundic gland polyps  ? ESOPHAGOGASTRODUODENOSCOPY  05/16/2011  ? ESOPHAGOGASTRODUODENOSCOPY  06/14/2012  ? EUS  12/15/2010  ? Procedure: UPPER ENDOSCOPIC ULTRASOUND (EUS) LINEAR;  Surgeon: Owens Loffler, MD;  Location: [facility];  Service: Endoscopy;  Laterality: N/A;  ? EYE SURGERY Bilateral   ? Bi lateral cateracts and bi lateral laser  ? LAPAROSCOPY N/A 02/25/2013  ? Procedure: Cystoscopy and laparoscopy with fulguration of  uterine serosa;  Surgeon: Jamey Reas de Berton Lan, MD;  Location: [facility];  Service: Gynecology;  Laterality: N/A;  ? TONSILLECTOMY    ? ?Family History  ?Problem Relation Age of Onset  ? Diabetes Mother   ? Stroke Father   ?     deceased age 66  ? Heart disease Sister   ?     deceased MI age 39  ? Diabetes Sister   ? Heart disease Sister   ? Hypertension Sister   ? Hyperlipidemia Sister   ? Diabetes Sister   ? Heart disease Sister   ? Hypertension Sister   ? Hyperlipidemia Sister   ? Heart  disease Brother   ?     deceased MI age 51  ? Intellectual disability Brother   ? Diabetes Brother   ? Heart disease Brother   ? Hypertension Brother   ? Hyperlipidemia Brother   ? Diabetes Maternal Grandmother   ? Hypertension Paternal Grandmother   ? Colon cancer Neg Hx   ? Esophageal cancer Neg Hx   ? Stomach cancer Neg Hx   ? Rectal cancer Neg Hx   ? ?Social History  ? ?Socioeconomic History  ? Marital status: Widowed  ?  Spouse name: Not on file  ? Number of children: 0  ? Years of education: college  ? Highest education level: Not on file  ?Occupational History  ? Occupation: retired  ?Tobacco Use  ? Smoking status: Never  ? Smokeless tobacco: Never  ? Tobacco comments:  ?  Never used tobacco  ?Vaping Use  ? Vaping Use: Never used  ?Substance and Sexual Activity  ? Alcohol use: No  ?  Alcohol/week: 0.0 standard drinks  ? Drug use: No  ? Sexual activity: Never  ?  Partners: Male  ?  Birth control/protection: Post-menopausal  ?  Comment: lives alone, no dietary restrictions except avoid fresh veg, fruit, whole grains  ?Other Topics Concern  ? Not on file  ?Social History Narrative  ? 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 Patient does not have any children.  ? Patient is right-handed.  ? Patient has a BA degree.  ? One caffeine drink daily   ? ?Social Determinants of Health  ? ?Financial Resource Strain: Low Risk   ? Difficulty of Paying Living Expenses: Not very hard  ?Food Insecurity: No Food Insecurity  ?  Worried About Charity fundraiser in the Last Year: Never true  ? Ran Out of Food in the Last Year: Never true  ?Transportation Needs: No Transportation Needs  ? Lack of Transportation (Medical): No  ? Lack of Transp

## 2021-06-07 NOTE — Patient Instructions (Signed)
[name] , ?Thank you for taking time to come for your Medicare Wellness Visit. I appreciate your ongoing commitment to your health goals. Please review the following plan we discussed and let me know if I can assist you in the future.  ? ?Screening recommendations/referrals: ?Colonoscopy: no longer needed ?Mammogram: 05/17/21 due 05/18/22 ?Bone Density: 04/27/20 due 04/28/23 ?Recommended yearly ophthalmology/optometry visit for glaucoma screening and checkup ?Recommended yearly dental visit for hygiene and checkup ? ?Vaccinations: ?Influenza vaccine: up to date ?Pneumococcal vaccine: up to date ?Tdap vaccine: Due-May obtain vaccine your local pharmacy.  ?Shingles vaccine: Due-May obtain vaccine at your local pharmacy.    ?Covid-19:Due-May obtain vaccine at your local pharmacy.  ? ?Advanced directives: no ? ?Conditions/risks identified: see problem list  ? ?Next appointment: Follow up in one year for your annual wellness visit  ? ? ?Preventive Care 4 Years and Older, Female ?Preventive care refers to lifestyle choices and visits with your health care provider that can promote health and wellness. ?What does preventive care include? ?A yearly physical exam. This is also called an annual well check. ?Dental exams once or twice a year. ?Routine eye exams. Ask your health care provider how often you should have your eyes checked. ?Personal lifestyle choices, including: ?Daily care of your teeth and gums. ?Regular physical activity. ?Eating a healthy diet. ?Avoiding tobacco and drug use. ?Limiting alcohol use. ?Practicing safe sex. ?Taking low-dose aspirin every day. ?Taking vitamin and mineral supplements as recommended by your health care provider. ?What happens during an annual well check? ?The services and screenings done by your health care provider during your annual well check will depend on your age, overall health, lifestyle risk factors, and family history of disease. ?Counseling  ?Your health care provider  may ask you questions about your: ?Alcohol use. ?Tobacco use. ?Drug use. ?Emotional well-being. ?Home and relationship well-being. ?Sexual activity. ?Eating habits. ?History of falls. ?Memory and ability to understand (cognition). ?Work and work Statistician. ?Reproductive health. ?Screening  ?You may have the following tests or measurements: ?Height, weight, and BMI. ?Blood pressure. ?Lipid and cholesterol levels. These may be checked every 5 years, or more frequently if you are over 26 years old. ?Skin check. ?Lung cancer screening. You may have this screening every year starting at age 3 if you have a 30-pack-year history of smoking and currently smoke or have quit within the past 15 years. ?Fecal occult blood test (FOBT) of the stool. You may have this test every year starting at age 50. ?Flexible sigmoidoscopy or colonoscopy. You may have a sigmoidoscopy every 5 years or a colonoscopy every 10 years starting at age 70. ?Hepatitis C blood test. ?Hepatitis B blood test. ?Sexually transmitted disease (STD) testing. ?Diabetes screening. This is done by checking your blood sugar (glucose) after you have not eaten for a while (fasting). You may have this done every 1-3 years. ?Bone density scan. This is done to screen for osteoporosis. You may have this done starting at age 82. ?Mammogram. This may be done every 1-2 years. Talk to your health care provider about how often you should have regular mammograms. ?Talk with your health care provider about your test results, treatment options, and if necessary, the need for more tests. ?Vaccines  ?Your health care provider may recommend certain vaccines, such as: ?Influenza vaccine. This is recommended every year. ?Tetanus, diphtheria, and acellular pertussis (Tdap, Td) vaccine. You may need a Td booster every 10 years. ?Zoster vaccine. You may need this after age 64. ?Pneumococcal 13-valent  conjugate (PCV13) vaccine. One dose is recommended after age 43. ?Pneumococcal  polysaccharide (PPSV23) vaccine. One dose is recommended after age 32. ?Talk to your health care provider about which screenings and vaccines you need and how often you need them. ?This information is not intended to replace advice given to you by your health care provider. Make sure you discuss any questions you have with your health care provider. ?Document Released: 02/19/2015 Document Revised: 10/13/2015 Document Reviewed: 11/24/2014 ?Elsevier Interactive Patient Education ? 2017 [place]. ? ?Fall Prevention in the Home ?Falls can cause injuries. They can happen to people of all ages. There are many things you can do to make your home safe and to help prevent falls. ?What can I do on the outside of my home? ?Regularly fix the edges of walkways and driveways and fix any cracks. ?Remove anything that might make you trip as you walk through a door, such as a raised step or threshold. ?Trim any bushes or trees on the path to your home. ?Use bright outdoor lighting. ?Clear any walking paths of anything that might make someone trip, such as rocks or tools. ?Regularly check to see if handrails are loose or broken. Make sure that both sides of any steps have handrails. ?Any raised decks and porches should have guardrails on the edges. ?Have any leaves, snow, or ice cleared regularly. ?Use sand or salt on walking paths during winter. ?Clean up any spills in your garage right away. This includes oil or grease spills. ?What can I do in the bathroom? ?Use night lights. ?Install grab bars by the toilet and in the tub and shower. Do not use towel bars as grab bars. ?Use non-skid mats or decals in the tub or shower. ?If you need to sit down in the shower, use a plastic, non-slip stool. ?Keep the floor dry. Clean up any water that spills on the floor as soon as it happens. ?Remove soap buildup in the tub or shower regularly. ?Attach bath mats securely with double-sided non-slip rug tape. ?Do not have throw rugs and other  things on the floor that can make you trip. ?What can I do in the bedroom? ?Use night lights. ?Make sure that you have a light by your bed that is easy to reach. ?Do not use any sheets or blankets that are too big for your bed. They should not hang down onto the floor. ?Have a firm chair that has side arms. You can use this for support while you get dressed. ?Do not have throw rugs and other things on the floor that can make you trip. ?What can I do in the kitchen? ?Clean up any spills right away. ?Avoid walking on wet floors. ?Keep items that you use a lot in easy-to-reach places. ?If you need to reach something above you, use a strong step stool that has a grab bar. ?Keep electrical cords out of the way. ?Do not use floor polish or wax that makes floors slippery. If you must use wax, use non-skid floor wax. ?Do not have throw rugs and other things on the floor that can make you trip. ?What can I do with my stairs? ?Do not leave any items on the stairs. ?Make sure that there are handrails on both sides of the stairs and use them. Fix handrails that are broken or loose. Make sure that handrails are as long as the stairways. ?Check any carpeting to make sure that it is firmly attached to the stairs. Fix any carpet that  is loose or worn. ?Avoid having throw rugs at the top or bottom of the stairs. If you do have throw rugs, attach them to the floor with carpet tape. ?Make sure that you have a light switch at the top of the stairs and the bottom of the stairs. If you do not have them, ask someone to add them for you. ?What else can I do to help prevent falls? ?Wear shoes that: ?Do not have high heels. ?Have rubber bottoms. ?Are comfortable and fit you well. ?Are closed at the toe. Do not wear sandals. ?If you use a stepladder: ?Make sure that it is fully opened. Do not climb a closed stepladder. ?Make sure that both sides of the stepladder are locked into place. ?Ask someone to hold it for you, if possible. ?Clearly  mark and make sure that you can see: ?Any grab bars or handrails. ?First and last steps. ?Where the edge of each step is. ?Use tools that help you move around (mobility aids) if they are needed. These include:

## 2021-06-14 ENCOUNTER — Other Ambulatory Visit: Payer: Self-pay | Admitting: Cardiology

## 2021-06-16 DIAGNOSIS — E1169 Type 2 diabetes mellitus with other specified complication: Secondary | ICD-10-CM | POA: Diagnosis not present

## 2021-06-21 ENCOUNTER — Ambulatory Visit: Payer: Medicare Other

## 2021-07-05 ENCOUNTER — Encounter: Payer: Self-pay | Admitting: Family Medicine

## 2021-07-05 ENCOUNTER — Ambulatory Visit (INDEPENDENT_AMBULATORY_CARE_PROVIDER_SITE_OTHER): Payer: Medicare Other | Admitting: Family Medicine

## 2021-07-05 VITALS — BP 122/68 | HR 62 | Resp 20 | Ht 61.0 in | Wt 168.0 lb

## 2021-07-05 DIAGNOSIS — I10 Essential (primary) hypertension: Secondary | ICD-10-CM | POA: Diagnosis not present

## 2021-07-05 DIAGNOSIS — E7800 Pure hypercholesterolemia, unspecified: Secondary | ICD-10-CM | POA: Diagnosis not present

## 2021-07-05 DIAGNOSIS — E1169 Type 2 diabetes mellitus with other specified complication: Secondary | ICD-10-CM | POA: Diagnosis not present

## 2021-07-05 DIAGNOSIS — E559 Vitamin D deficiency, unspecified: Secondary | ICD-10-CM

## 2021-07-05 DIAGNOSIS — D500 Iron deficiency anemia secondary to blood loss (chronic): Secondary | ICD-10-CM | POA: Diagnosis not present

## 2021-07-05 DIAGNOSIS — N289 Disorder of kidney and ureter, unspecified: Secondary | ICD-10-CM

## 2021-07-05 DIAGNOSIS — E669 Obesity, unspecified: Secondary | ICD-10-CM

## 2021-07-05 NOTE — Progress Notes (Signed)
Subjective:   By signing my name below, I, Zite Okoli, attest that this documentation has been prepared under the direction and in the presence of [name], MD. 07/05/2021     [name], female    DOB: May 09, 1940, 81 y.o.   MRN: [id number]  No chief complaint on file.   HPI Patient is in today for an office visit.  She checks her blood pressure at home and notes the readings have been low. Systolic have been in the 100's. She notes she is always feeling dizzy and is not consistent with the blood pressure readings.  BP Readings from Last 3 Encounters:  07/05/21 122/68  05/04/21 (!) 99/59  03/08/21 104/60    She has seen several specialists to manage the dizziness. Describes it as a "woozy feeling" and not like she is spinning. She has had no falls. Notes it is worse when she stands up suddenly. She does some exercises from physical therapy.   She reports her lowest blood sugar was this morning at 116.  She is requesting for a OTC replacement for betamethasone ointment.  She reports her brother recently died.   Past Medical History:  Diagnosis Date   Abdominal pain in female 03/18/2010   Qualifier: Diagnosis of  By: Carlean Purl MD, Dimas Millin    Anemia 06/08/2014   Anxiety    Arthritis    Spinal Osteoarthritis   Cancer ([place])    Carcinoid tumor of stomach    Cataract    Chest pain    Myoview 12/15 no ischemia.   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 ([place]) 09/05/2006   Qualifier: Diagnosis of  By: Marca Ancona RMA, Lucy     Diabetic peripheral neuropathy ([place]) 10/29/2013   Encounter for preventative adult health care exam with abnormal findings 09/14/2013   Esophageal reflux    Gastric polyp    Fundic Gland   Gastroparesis    Headache(784.0)    Heart murmur    Echocardiogram 2/11: EF 60-65%, mild LAE, grade 1 diastolic dysfunction, aortic valve sclerosis, mean  gradient 9 mm of mercury, PASP 34   Hematuria 03/16/2016   Iron deficiency anemia, unspecified    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   Leg swelling    bilateral   Neck pain 04/22/2015   PONV (postoperative nausea and vomiting)    pt states only needs small amount of anesthesia   PSVT (paroxysmal supraventricular tachycardia) ([place])    Pure hypercholesterolemia    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   Renal insufficiency 06/18/2019   Stroke ([place])    tia, 2014   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   Type II or unspecified type diabetes mellitus without mention of complication, not stated as uncontrolled    Unspecified essential hypertension    Unspecified hereditary and idiopathic peripheral neuropathy 10/29/2013    Past Surgical History:  Procedure Laterality Date   CHOLECYSTECTOMY  1993   COLONOSCOPY  11/11/2010   diverticulosis   DILATATION & CURRETTAGE/HYSTEROSCOPY WITH RESECTOCOPE N/A 02/25/2013   Procedure: Attempted hysteroscopy with uterine perforation;  Surgeon: Jamey Reas de Berton Lan, MD;  Location: [facility];  Service: Gynecology;  Laterality: N/A;   ESOPHAGOGASTRODUODENOSCOPY  08/29/2010; 09/15/2010   Carcinoid tumor less than 1 cm in July 2012 not seen in August 2012 , gastritis, fundic gland polyps   ESOPHAGOGASTRODUODENOSCOPY  05/16/2011   ESOPHAGOGASTRODUODENOSCOPY  06/14/2012   EUS  12/15/2010   Procedure: UPPER ENDOSCOPIC ULTRASOUND (EUS) LINEAR;  Surgeon: Owens Loffler, MD;  Location: [facility];  Service: Endoscopy;  Laterality: N/A;   EYE SURGERY Bilateral    Bi lateral cateracts and bi lateral laser   LAPAROSCOPY N/A 02/25/2013   Procedure: Cystoscopy and laparoscopy with fulguration of uterine serosa;  Surgeon: Jamey Reas de Berton Lan, MD;  Location: [facility];  Service: Gynecology;  Laterality: N/A;   TONSILLECTOMY      Family History  Problem Relation Age of Onset   Diabetes Mother    Stroke Father        deceased age 26   Heart disease Sister        deceased MI age 59    Diabetes Sister    Heart disease Sister    Hypertension Sister    Hyperlipidemia Sister    Diabetes Sister    Heart disease Sister    Hypertension Sister    Hyperlipidemia Sister    Heart disease Brother        deceased MI age 11   Intellectual disability Brother    Diabetes Brother    Heart disease Brother    Hypertension Brother    Hyperlipidemia Brother    Diabetes Maternal Grandmother    Hypertension Paternal Grandmother    Colon cancer Neg Hx    Esophageal cancer Neg Hx    Stomach cancer Neg Hx    Rectal cancer Neg Hx     Social History   Socioeconomic History   Marital status: Widowed    Spouse name: Not on file   Number of children: 0   Years of education: college   Highest education level: Not on file  Occupational History   Occupation: retired  Tobacco Use   Smoking status: Never   Smokeless tobacco: Never   Tobacco comments:    Never used tobacco  Vaping Use   Vaping Use: Never used  Substance and Sexual Activity   Alcohol use: No    Alcohol/week: 0.0 standard drinks   Drug use: No   Sexual activity: Never    Partners: Male    Birth control/protection: Post-menopausal    Comment: lives alone, no dietary restrictions except avoid fresh veg, fruit, whole grains  Other Topics Concern   Not on file  Social History Narrative   Patient was married ([name]) - widow   Patient does not have any children.   Patient is right-handed.   Patient has a BA degree.   One caffeine drink daily    Social Determinants of Health   Financial Resource Strain: Low Risk    Difficulty of Paying Living Expenses: Not very hard  Food Insecurity: No Food Insecurity   Worried About Charity fundraiser in the Last Year: Never true   Ran Out of Food in the Last Year: Never true  Transportation Needs: No Transportation Needs   Lack of Transportation (Medical): No   Lack of Transportation (Non-Medical): No  Physical Activity: Insufficiently Active   Days of Exercise per Week:  5 days   Minutes of Exercise per Session: 10 min  Stress: No Stress Concern Present   Feeling of Stress : Not at all  Social Connections: Moderately Isolated   Frequency of Communication with Friends and Family: More than three times a week   Frequency of Social Gatherings with Friends and Family: Once a week   Attends Religious Services: More than 4 times per year   Active Member of [employer]  or Organizations: No   Attends Archivist Meetings: Never   Marital Status: Widowed  Human resources officer Violence: Not At Risk   Fear of Current or Ex-Partner: No   Emotionally Abused: No   Physically Abused: No   Sexually Abused: No    Outpatient Medications Prior to Visit  Medication Sig Dispense Refill   acetaminophen (TYLENOL) 325 MG tablet Take 2 tablets (650 mg total) by mouth every 6 (six) hours as needed for up to 30 doses for mild pain or moderate pain. 30 tablet 0   amLODipine (NORVASC) 5 MG tablet TAKE 1 TABLET BY MOUTH  DAILY 90 tablet 3   aspirin EC 81 MG tablet Take 81 mg by mouth daily. Swallow whole.     betamethasone valerate ointment (VALISONE) 0.1 % Apply qod to affected area, small amount 45 g 1   Blood Glucose Monitoring Suppl (ONE TOUCH ULTRA 2) w/Device KIT USE TO CHECK BLOOD SUGAR AS DIRECTED 1 kit 0   cholecalciferol (VITAMIN D) 1000 UNITS tablet Take 1,000 Units by mouth daily.     COVID-19 At Home Antigen Test (CARESTART COVID-19 HOME TEST) KIT Use as directed 2 kit 0   dicyclomine (BENTYL) 10 MG capsule TAKE 1 CAPSULE BY MOUTH 4  TIMES DAILY AS NEEDED 240 capsule 0   furosemide (LASIX) 20 MG tablet TAKE 1 TABLET BY MOUTH  DAILY AS NEEDED 90 tablet 3   hydrALAZINE (APRESOLINE) 10 MG tablet Take 10 mg by mouth as needed.     Lancets (ONETOUCH ULTRASOFT) lancets USE AS DIRECTED 3 TIMES  DAILY 300 each 3   losartan (COZAAR) 50 MG tablet TAKE 1 TABLET BY MOUTH TWICE  DAILY 180 tablet 3   metFORMIN (GLUCOPHAGE-XR) 500 MG 24 hr tablet TAKE 1 TABLET BY MOUTH IN  THE  MORNING AND AT BEDTIME 180 tablet 3   metoCLOPramide (REGLAN) 5 MG tablet Take 1 tablet (5 mg total) by mouth 2 (two) times daily as needed. 180 tablet 0   metoprolol tartrate (LOPRESSOR) 50 MG tablet TAKE 1 AND 1/2 TABLETS BY  MOUTH TWICE DAILY 90 tablet 1   NEXIUM 40 MG capsule Take 1 capsule (40 mg total) by mouth daily before breakfast. 90 capsule 3   ondansetron (ZOFRAN) 4 MG tablet Take 4 mg by mouth every 4 (four) hours as needed. For nausea     ONETOUCH ULTRA test strip CHECK BLOOD SUGAR 3 TIMES  DAILY OR AS NEEDED 300 strip 3   potassium chloride (KLOR-CON M) 10 MEQ tablet TAKE 1 TABLET BY MOUTH  DAILY 90 tablet 3   rosuvastatin (CRESTOR) 20 MG tablet TAKE 1 TABLET BY MOUTH  DAILY 90 tablet 3   thiamine (VITAMIN B-1) 100 MG tablet Take 100 mg by mouth every other day.     topiramate (TOPAMAX) 25 MG tablet Take 1 tablet (25 mg total) by mouth 2 (two) times daily. 120 tablet 3   No facility-administered medications prior to visit.    Allergies  Allergen Reactions   Tramadol Other (See Comments)    Dizziness     Review of Systems  Constitutional:  Negative for fever and malaise/fatigue.  HENT:  Negative for congestion.   Eyes:  Negative for redness.  Respiratory:  Negative for shortness of breath.   Cardiovascular:  Negative for chest pain, palpitations and leg swelling.  Gastrointestinal:  Negative for abdominal pain, blood in stool and nausea.  Genitourinary:  Negative for dysuria and frequency.  Musculoskeletal:  Negative for falls.  Skin:  Negative for rash.  Neurological:  Positive for dizziness. Negative for loss of consciousness and headaches.  Endo/Heme/Allergies:  Negative for polydipsia.  Psychiatric/Behavioral:  Negative for depression. The patient is not nervous/anxious.       Objective:    Physical Exam Constitutional:      General: She is not in acute distress.    Appearance: She is well-developed.  HENT:     Head: Normocephalic and atraumatic.  Eyes:      Conjunctiva/sclera: Conjunctivae normal.  Neck:     Thyroid: No thyromegaly.  Cardiovascular:     Rate and Rhythm: Normal rate and regular rhythm.     Heart sounds: Normal heart sounds. No murmur heard. Pulmonary:     Effort: Pulmonary effort is normal. No respiratory distress.     Breath sounds: Normal breath sounds.  Abdominal:     General: Bowel sounds are normal. There is no distension.     Palpations: Abdomen is soft. There is no mass.     Tenderness: There is no abdominal tenderness.  Musculoskeletal:     Cervical back: Neck supple.  Lymphadenopathy:     Cervical: No cervical adenopathy.  Skin:    General: Skin is warm and dry.  Neurological:     Mental Status: She is alert and oriented to person, place, and time.  Psychiatric:        Behavior: Behavior normal.    LMP 02/07/1992  Wt Readings from Last 3 Encounters:  05/04/21 167 lb 1.9 oz (75.8 kg)  03/08/21 169 lb (76.7 kg)  03/02/21 166 lb (75.3 kg)    Diabetic Foot Exam - Simple   No data filed    Lab Results  Component Value Date   WBC 8.3 05/31/2021   HGB 12.7 05/31/2021   HCT 38.2 05/31/2021   PLT 207.0 05/31/2021   GLUCOSE 122 (H) 05/31/2021   CHOL 178 05/31/2021   TRIG 122.0 05/31/2021   HDL 65.70 05/31/2021   LDLDIRECT 95.0 03/25/2020   LDLCALC 88 05/31/2021   ALT 19 05/31/2021   AST 19 05/31/2021   NA 135 05/31/2021   K 4.6 05/31/2021   CL 100 05/31/2021   CREATININE 0.77 05/31/2021   BUN 26 (H) 05/31/2021   CO2 25 05/31/2021   TSH 3.13 05/31/2021   INR 0.88 01/16/2011   HGBA1C 6.6 (H) 05/31/2021   MICROALBUR 1.6 11/13/2014    Lab Results  Component Value Date   TSH 3.13 05/31/2021   Lab Results  Component Value Date   WBC 8.3 05/31/2021   HGB 12.7 05/31/2021   HCT 38.2 05/31/2021   MCV 85.6 05/31/2021   PLT 207.0 05/31/2021   Lab Results  Component Value Date   NA 135 05/31/2021   K 4.6 05/31/2021   CHLORIDE 101 07/08/2015   CO2 25 05/31/2021   GLUCOSE 122 (H)  05/31/2021   BUN 26 (H) 05/31/2021   CREATININE 0.77 05/31/2021   BILITOT 0.6 05/31/2021   ALKPHOS 55 05/31/2021   AST 19 05/31/2021   ALT 19 05/31/2021   PROT 6.8 05/31/2021   ALBUMIN 4.4 05/31/2021   CALCIUM 9.8 05/31/2021   ANIONGAP 10 05/04/2021   EGFR 75 (L) 07/08/2015   GFR 72.98 05/31/2021   Lab Results  Component Value Date   CHOL 178 05/31/2021   Lab Results  Component Value Date   HDL 65.70 05/31/2021   Lab Results  Component Value Date   LDLCALC 88 05/31/2021   Lab Results  Component Value Date  TRIG 122.0 05/31/2021   Lab Results  Component Value Date   CHOLHDL 3 05/31/2021   Lab Results  Component Value Date   HGBA1C 6.6 (H) 05/31/2021       Assessment & Plan:   Problem List Items Addressed This Visit   None   No orders of the defined types were placed in this encounter.   I,Zite Okoli,acting as a Education administrator for Penni Homans, MD.,have documented all relevant documentation on the behalf of Penni Homans, MD,as directed by  Penni Homans, MD while in the presence of Penni Homans, MD.   I, [name], MD., personally preformed the services described in this documentation.  All medical record entries made by the scribe were at my direction and in my presence.  I have reviewed the chart and discharge instructions (if applicable) and agree that the record reflects my personal performance and is accurate and complete. 07/05/2021

## 2021-07-05 NOTE — Assessment & Plan Note (Signed)
Encourage heart healthy diet such as MIND or DASH diet, increase exercise, avoid trans fats, simple carbohydrates and processed foods, consider a krill or fish or flaxseed oil cap daily.  Tolerating Rosuvastatin 

## 2021-07-05 NOTE — Patient Instructions (Addendum)
Mylanta at bedtime for heartburn  Hydrocortisone cream can replace the betamethasone cream Food Choices for Gastroesophageal Reflux Disease, Adult When you have gastroesophageal reflux disease (GERD), the foods you eat and your eating habits are very important. Choosing the right foods can help ease your discomfort. Think about working with a food expert (dietitian) to help you make good choices. What are tips for following this plan? Reading food labels Look for foods that are low in saturated fat. Foods that may help with your symptoms include: Foods that have less than 5% of daily value (DV) of fat. Foods that have 0 grams of trans fat. Cooking Do not fry your food. Cook your food by baking, steaming, grilling, or broiling. These are all methods that do not need a lot of fat for cooking. To add flavor, try to use herbs that are low in spice and acidity. Meal planning  Choose healthy foods that are low in fat, such as: Fruits and vegetables. Whole grains. Low-fat dairy products. Lean meats, fish, and poultry. Eat small meals often instead of eating 3 large meals each day. Eat your meals slowly in a place where you are relaxed. Avoid bending over or lying down until 2-3 hours after eating. Limit high-fat foods such as fatty meats or fried foods. Limit your intake of fatty foods, such as oils, butter, and shortening. Avoid the following as told by your doctor: Foods that cause symptoms. These may be different for different people. Keep a food diary to keep track of foods that cause symptoms. Alcohol. Drinking a lot of liquid with meals. Eating meals during the 2-3 hours before bed. Lifestyle Stay at a healthy weight. Ask your doctor what weight is healthy for you. If you need to lose weight, work with your doctor to do so safely. Exercise for at least 30 minutes on 5 or more days each week, or as told by your doctor. Wear loose-fitting clothes. Do not smoke or use any products that  contain nicotine or tobacco. If you need help quitting, ask your doctor. Sleep with the head of your bed higher than your feet. Use a wedge under the mattress or blocks under the bed frame to raise the head of the bed. Chew sugar-free gum after meals. What foods should eat?  Eat a healthy, well-balanced diet of fruits, vegetables, whole grains, low-fat dairy products, lean meats, fish, and poultry. Each person is different. Foods that may cause symptoms in one person may not cause any symptoms in another person. Work with your doctor to find foods that are safe for you. The items listed above may not be a complete list of what you can eat and drink. Contact a food expert for more options. What foods should I avoid? Limiting some of these foods may help in managing the symptoms of GERD. Everyone is different. Talk with a food expert or your doctor to help you find the exact foods to avoid, if any. Fruits Any fruits prepared with added fat. Any fruits that cause symptoms. For some people, this may include citrus fruits, such as oranges, grapefruit, pineapple, and lemons. Vegetables Deep-fried vegetables. [place] fries. Any vegetables prepared with added fat. Any vegetables that cause symptoms. For some people, this may include tomatoes and tomato products, chili peppers, onions and garlic, and horseradish. Grains Pastries or quick breads with added fat. Meats and other proteins High-fat meats, such as fatty beef or pork, hot dogs, ribs, ham, sausage, salami, and bacon. Fried meat or protein, including fried  fish and fried chicken. Nuts and nut butters, in large amounts. Dairy Whole milk and chocolate milk. Sour cream. Cream. Ice cream. Cream cheese. Milkshakes. Fats and oils Butter. Margarine. Shortening. Ghee. Beverages Coffee and tea, with or without caffeine. Carbonated beverages. Sodas. Energy drinks. Fruit juice made with acidic fruits, such as orange or grapefruit. Tomato juice. Alcoholic  drinks. Sweets and desserts Chocolate and cocoa. Donuts. Seasonings and condiments Pepper. Peppermint and spearmint. Added salt. Any condiments, herbs, or seasonings that cause symptoms. For some people, this may include curry, hot sauce, or vinegar-based salad dressings. The items listed above may not be a complete list of what you should not eat and drink. Contact a food expert for more options. Questions to ask your doctor Diet and lifestyle changes are often the first steps that are taken to manage symptoms of GERD. If diet and lifestyle changes do not help, talk with your doctor about taking medicines. Where to find more information International Foundation for Gastrointestinal Disorders: aboutgerd.org Summary When you have GERD, food and lifestyle choices are very important in easing your symptoms. Eat small meals often instead of 3 large meals a day. Eat your meals slowly and in a place where you are relaxed. Avoid bending over or lying down until 2-3 hours after eating. Limit high-fat foods such as fatty meats or fried foods. This information is not intended to replace advice given to you by your health care provider. Make sure you discuss any questions you have with your health care provider. Document Revised: 08/04/2019 Document Reviewed: 08/04/2019 Elsevier Patient Education  [place].

## 2021-07-05 NOTE — Assessment & Plan Note (Signed)
Supplement and monitor 

## 2021-07-05 NOTE — Assessment & Plan Note (Signed)
hgba1c acceptable, minimize simple carbs. Increase exercise as tolerated.  

## 2021-07-05 NOTE — Assessment & Plan Note (Signed)
Well controlled, no changes to meds. Encouraged heart healthy diet such as the DASH diet and exercise as tolerated.  °

## 2021-07-06 ENCOUNTER — Telehealth: Payer: Self-pay

## 2021-07-06 ENCOUNTER — Other Ambulatory Visit: Payer: Self-pay

## 2021-07-06 DIAGNOSIS — E871 Hypo-osmolality and hyponatremia: Secondary | ICD-10-CM

## 2021-07-06 LAB — COMPREHENSIVE METABOLIC PANEL
ALT: 21 U/L (ref 0–35)
AST: 19 U/L (ref 0–37)
Albumin: 4.5 g/dL (ref 3.5–5.2)
Alkaline Phosphatase: 55 U/L (ref 39–117)
BUN: 20 mg/dL (ref 6–23)
CO2: 26 mEq/L (ref 19–32)
Calcium: 9.7 mg/dL (ref 8.4–10.5)
Chloride: 94 mEq/L — ABNORMAL LOW (ref 96–112)
Creatinine, Ser: 0.91 mg/dL (ref 0.40–1.20)
GFR: 59.69 mL/min — ABNORMAL LOW (ref >60.00)
Glucose, Bld: 119 mg/dL — ABNORMAL HIGH (ref 70–99)
Potassium: 4.9 mEq/L (ref 3.5–5.1)
Sodium: 131 mEq/L — ABNORMAL LOW (ref 135–145)
Total Bilirubin: 0.5 mg/dL (ref 0.2–1.2)
Total Protein: 7 g/dL (ref 6.0–8.3)

## 2021-07-06 LAB — MICROALBUMIN / CREATININE URINE RATIO
Creatinine,U: 50.9 mg/dL
Microalb Creat Ratio: 5.6 mg/g (ref 0.0–30.0)
Microalb, Ur: 2.8 mg/dL — ABNORMAL HIGH (ref 0.0–1.9)

## 2021-07-06 NOTE — Assessment & Plan Note (Signed)
Hydrate and monitor 

## 2021-07-08 NOTE — Telephone Encounter (Signed)
Done

## 2021-07-19 ENCOUNTER — Telehealth: Payer: Self-pay | Admitting: Internal Medicine

## 2021-07-19 NOTE — Telephone Encounter (Signed)
Pt stated that for the past week she has been having a feeling of heaviness/fullness after she eats. Pt states that she has been having reflux and a dry cough from time to time. Pt went to PCP and PCP stated that the dry cough could be coming from the Issues with her stomach. Pt was scheduled for the first available appointment with an APP 08/04/2021 at 1:30  with Amy Esterwood PA. Pt made aware: Pt was wondering if there is something she could try prior to appointment:  Please advise:

## 2021-07-19 NOTE — Telephone Encounter (Signed)
Inbound call from patient stating that she is having severe abd pain and is wanting to discuss with nurse. Please advise.

## 2021-07-19 NOTE — Telephone Encounter (Signed)
Is she taking her metaclopramide or dicyclomine?

## 2021-07-20 NOTE — Telephone Encounter (Signed)
She can try to take the dicyclomine 2-3 times a day.  I do not have any other recommendations at this time.

## 2021-07-20 NOTE — Telephone Encounter (Signed)
Pt stated that she has taking the metaclopramide  only  twice in the last week: Pt stated that she took the dicyclomine once in the morning starting the last four days:  Please advise

## 2021-07-20 NOTE — Telephone Encounter (Signed)
Pt was made aware of Dr. Gessner recommendations Pt verbalized understanding with all questions answered.   

## 2021-07-25 ENCOUNTER — Telehealth: Payer: Self-pay | Admitting: Neurology

## 2021-07-25 NOTE — Telephone Encounter (Signed)
Called pt to r/s 09/27/21 appt (MD out), pt stated she would just cancel for now and call us back to r/s at a later date.

## 2021-07-26 ENCOUNTER — Telehealth: Payer: Self-pay

## 2021-07-26 ENCOUNTER — Ambulatory Visit (INDEPENDENT_AMBULATORY_CARE_PROVIDER_SITE_OTHER): Payer: Medicare Other

## 2021-07-26 DIAGNOSIS — E7800 Pure hypercholesterolemia, unspecified: Secondary | ICD-10-CM

## 2021-07-26 DIAGNOSIS — I10 Essential (primary) hypertension: Secondary | ICD-10-CM

## 2021-07-26 DIAGNOSIS — K219 Gastro-esophageal reflux disease without esophagitis: Secondary | ICD-10-CM

## 2021-07-26 NOTE — Telephone Encounter (Signed)
   Telephone encounter was:  Successful.  07/26/2021 Name: SECILY WALTHOUR MRN: [id number] DOB: 1940/09/30  Zyon N Capes is a 81 y.o. year old female who is a primary care patient of [name], MD . The community resource team was consulted for assistance with  socialization resources  Care guide performed the following interventions: Patient provided with information about care guide support team and interviewed to confirm resource needs.Pt stated she wanted resources to increase socialization, such as volunteering opportunities, senior resource center, etc  Follow Up Plan:  Care guide will follow up with patient by phone over the next two weeks    [facility]  [phone] [address], [place], [place] [address] Phone: [phone] Email: [email or web address]

## 2021-07-26 NOTE — Chronic Care Management (AMB) (Signed)
Chronic Care Management   CCM RN Visit Note  07/26/2021 Name: [name] MRN: [id number] DOB: Apr 28, 1940  Subjective: [name] is a 81 y.o. year old female who is a primary care patient of Valerie Lukes, MD. The care management team was consulted for assistance with disease management and care coordination needs.    Engaged with patient by telephone for follow up visit in response to provider referral for case management and/or care coordination services.   Consent to Services:  The patient was given information about Chronic Care Management services, agreed to services, and gave verbal consent prior to initiation of services.  Please see initial visit note for detailed documentation.   Patient agreed to services and verbal consent obtained.   Assessment: Review of patient past medical history, allergies, medications, health status, including review of consultants reports, laboratory and other test data, was performed as part of comprehensive evaluation and provision of chronic care management services.   SDOH (Social Determinants of Health) assessments and interventions performed:    CCM Care Plan  Allergies  Allergen Reactions   Tramadol Other (See Comments)    Dizziness     Outpatient Encounter Medications as of 07/26/2021  Medication Sig Note   acetaminophen (TYLENOL) 325 MG tablet Take 2 tablets (650 mg total) by mouth every 6 (six) hours as needed for up to 30 doses for mild pain or moderate pain.    amLODipine (NORVASC) 5 MG tablet TAKE 1 TABLET BY MOUTH  DAILY    aspirin EC 81 MG tablet Take 81 mg by mouth daily. Swallow whole.    betamethasone valerate ointment (VALISONE) 0.1 % Apply qod to affected area, small amount    cholecalciferol (VITAMIN D) 1000 UNITS tablet Take 1,000 Units by mouth daily.    dicyclomine (BENTYL) 10 MG capsule TAKE 1 CAPSULE BY MOUTH 4  TIMES DAILY AS NEEDED    furosemide (LASIX) 20 MG tablet TAKE 1 TABLET BY MOUTH  DAILY AS  NEEDED    losartan (COZAAR) 50 MG tablet TAKE 1 TABLET BY MOUTH TWICE  DAILY    metFORMIN (GLUCOPHAGE-XR) 500 MG 24 hr tablet TAKE 1 TABLET BY MOUTH IN  THE MORNING AND AT BEDTIME    metoCLOPramide (REGLAN) 5 MG tablet Take 1 tablet (5 mg total) by mouth 2 (two) times daily as needed.    metoprolol tartrate (LOPRESSOR) 50 MG tablet TAKE 1 AND 1/2 TABLETS BY  MOUTH TWICE DAILY    NEXIUM 40 MG capsule Take 1 capsule (40 mg total) by mouth daily before breakfast. 06/03/2021: Using over-the-counter    ondansetron (ZOFRAN) 4 MG tablet Take 4 mg by mouth every 4 (four) hours as needed. For nausea    potassium chloride (KLOR-CON M) 10 MEQ tablet TAKE 1 TABLET BY MOUTH  DAILY    rosuvastatin (CRESTOR) 20 MG tablet TAKE 1 TABLET BY MOUTH  DAILY    thiamine (VITAMIN B-1) 100 MG tablet Take 100 mg by mouth every other day.    Blood Glucose Monitoring Suppl (ONE TOUCH ULTRA 2) w/Device KIT USE TO CHECK BLOOD SUGAR AS DIRECTED    COVID-19 At Home Antigen Test (CARESTART COVID-19 HOME TEST) KIT Use as directed    Lancets (ONETOUCH ULTRASOFT) lancets USE AS DIRECTED 3 TIMES  DAILY    ONETOUCH ULTRA test strip CHECK BLOOD SUGAR 3 TIMES  DAILY OR AS NEEDED    No facility-administered encounter medications on file as of 07/26/2021.    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   Renal insufficiency 06/18/2019   Educated about COVID-19 virus infection 05/15/2019   Atrophic vaginitis 01/20/2019   Stenosis of carotid artery 11/12/2018   Anxiety 10/13/2018   Thrush 12/07/2017   Sinusitis 10/11/2017   Headache 07/12/2017   Dizzy spells 11/21/2016   Constipation 11/21/2016   Nonrheumatic aortic valve stenosis 05/23/2016    Aortic atherosclerosis (HCC) 05/23/2016   Hematuria 03/16/2016   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   Pain of upper abdomen 06/06/2015   Neck pain 04/22/2015   Ear pain 02/28/2015   Abnormal urine 11/21/2014   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   Anemia 06/08/2014   RLS (restless legs syndrome) 11/23/2013   Diabetic peripheral neuropathy ([place]) 10/29/2013   Left-sided thoracic back pain 10/06/2013   Encounter for preventative adult health care exam with abnormal findings 09/14/2013   Iron deficiency anemia    Status post laparoscopy 02/25/2013   Hyponatremia 01/09/2013   GERD (gastroesophageal reflux disease) 01/09/2013   Amaurosis fugax of left eye 10/16/2012   Low back pain 06/03/2012   Vitamin D deficiency 03/11/2012   Bilateral hand pain 10/20/2011   Encounter for long-term (current) use of other medications 10/20/2011   IBS (irritable bowel syndrome) 08/14/2011   TIA (transient ischemic attack) 02/10/2011   Abnormal brain CT 01/19/2011   Allergic rhinitis 10/01/2010   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   FUNDIC GLAND POLYPS OF STOMACH 03/18/2010   Abdominal pain in female 03/18/2010   Pain in joint 44/02/270   SYSTOLIC MURMUR 53/66/4403   Paroxysmal supraventricular tachycardia ([place]) 01/12/2009   PLANTAR FASCIITIS 06/08/2008   CHEST PAIN 05/18/2008   Gastroparesis 12/18/2007   HYPERCHOLESTEROLEMIA 06/11/2007   Diabetes mellitus type 2 in obese ([place]) 09/05/2006   Essential hypertension 09/05/2006    Conditions to be addressed/monitored:HTN, HLD, DMII, and Gastroparesis  Care Plan : RN Care Manager Plan of Care  Updates made by [name], RN since 07/26/2021 12:00 AM  Completed 07/26/2021   Problem: Chronic Disease Management Education and/or Care Coordination needs Resolved 07/26/2021  Priority: High     Long-Range Goal: Development of Plan of Care for Chronic Disease Management Completed 07/26/2021  Start Date: 01/10/2021   Expected End Date: 10/27/2021  Priority: High  Note:   Current Barriers:  07/26/21 RNCM spoke with [name] who reports that she is "OK" today. She reports blood sugar today was 120 this morning. Last A1C 6.6 on 05/31/21. She denies any problems with blood pressure. Last PCP visit 07/05/21 blood pressure 122/68. Last lipid level within normal limits. Ms. Melony Overly reports she continues to have problems with gastroparesis, but verbalizes she attends provider visits as scheduled and contacts GI doctor as needed. She voices she is taking medications as prescribed. Next appointment scheduled with Gastroenterologist for 08/04/21 Patient would benefit from increased socialization and is receptive to resources. Discussed volunteering, taking art classes, taking non credit classes at [employer]. RNCM provided contact number to [employer] non credit classes. Referral sent to care guide for increased socialization. Patient encouraged to contact Primary care provider and/or [facility] for care management needs that may arise in the future. Clinical pharmacist with scheduled call on 09/09/21. Knowledge Deficits related to plan of care  for management of HTN and DMII  Limited Social Support Does not monitoring blood pressure RNCM Clinical Goal(s):  Patient will take all medications exactly as prescribed and will call provider for medication related questions as evidenced by self report and/or chart notation    demonstrate improved adherence to prescribed treatment plan for HTN and DMII as evidenced by improved daily monitoring and recording of CBG, adherence to ADA/care modified diet, increase activity, improved adherence to prescribed medication regimen, contact provider for new or worsened symptoms or questions.  through collaboration with Consulting civil engineer, provider, and care team.   Interventions: 1:1 collaboration with primary care provider regarding development and update of comprehensive plan of care as evidenced by provider  attestation and co-signature Inter-disciplinary care team collaboration (see longitudinal plan of care) Evaluation of current treatment plan related to  self management and patient's adherence to plan as established by provider  Diabetes Interventions:  (Status:  Goal Met.) Long Term Goal Assessed patient's understanding of A1c goal: <7% Reviewed medications with patient and discussed importance of medication adherence Reviewed blood sugar goals with patient: FBS 80-130 and after a meal <180 Discussed healthy eating. Encouraged to avoid fast foods, saturated fats, trans fats and processed food. Encouraged to include foods from Gastroparesis food list provided by gastroenterologist. Lab Results  Component Value Date   HGBA1C 6.6 (H) 05/31/2021  Hypertension Interventions:  (Status:  Goal Met.) Long Term Goal Last practice recorded BP readings:  BP Readings from Last 3 Encounters:  07/05/21 122/68  05/04/21 (!) 99/59  03/08/21 104/60  Most recent eGFR/CrCl:  Lab Results  Component Value Date   EGFR 75 (L) 07/08/2015    No components found for: CRCL  Evaluation of current treatment plan related to hypertension self management and patient's adherence to plan as established by provider Medications reviewed and encouraged to take as prescribed   Reviewed upcoming/scheduled appointments  Gastroparesis/Irritable Bowel Syndrome/Acid Reflux Interventions  (Status:  Goal Met.)  Long Term Goal Ms. Mussallam reports she continues to have problems with gastroparesis. She verbalizes she attends provider visits as scheduled and contacts GI doctor as needed. She voices she is taking medications as prescribed. Last appointment with PCP 07/05/21. Next appointment scheduled with Gastroenterologist for 08/04/21  RNCM encouraged patient to take as needed medications as prescribed and contact doctor if condition worsens or does not improve RNCM reviewed diet handout provided by PCP for  gastroparesis Encouraged to attend GI appointment as scheduled Advised patient to follow Gastroparesis diet provided by Gastroenterologist  Hyperlipidemia Interventions:  (Status:  Goal Met.) Long Term Goal Encouraged to take medications as scheduled Counseled on importance of regular laboratory monitoring as prescribed Encouraged to plan meals per provider recommendations Reiterated with patient last lipid levels. Provided positive feedback regarding how well she has done. Reviewed upcoming/scheduled appointments  Patient Goals/Self-Care Activities: Contact your provider for any new or worsening health concerns eat healthy, may need to eat small frequent meals, monitor portion sizes keep all doctor appointments check blood sugar at prescribed times and check blood sugar if you feel it is too high or too low or during times when your usual meal intake has changed Goals for blood sugar readings: Fasting 80-130 and after a meal <180. contact your RN Care Manager with care management and/or care coordination needs Expect a call from the Care Guide to provide resources for Socialization   Plan:  No follow up required.  Patient encouraged to contact Primary care provider and/or RNCM as needed for care management  needs that may arise in the future.   Thea Silversmith, RN, MSN, BSN, CCM Care Management Coordinator [facility] [phone]

## 2021-07-26 NOTE — Patient Instructions (Addendum)
Visit Information  Thank you for allowing me to share the care management and care coordination services that are available to you as part of your health plan and services through your primary care provider and medical home. Expect a call from the Care Guide to provide resources for Socialization. Please reach out to me at [phone], if the care management/care coordination team may be of assistance to you in the future.    Thea Silversmith, RN, MSN, BSN, CCM Care Management Coordinator [facility] [phone]

## 2021-07-28 ENCOUNTER — Telehealth: Payer: Self-pay

## 2021-07-28 NOTE — Telephone Encounter (Signed)
Pt called in- she saw on Mychart that she has a lab appt scheduled for 6/30- she wanted to know what it is for. Chart reviewed. Labs from 07/05/21 showed hyponatremia and PCP wanted it rechecked in 1 month- I informed Pt of this- she verbalized understanding.

## 2021-08-01 ENCOUNTER — Telehealth: Payer: Self-pay

## 2021-08-04 ENCOUNTER — Encounter: Payer: Self-pay | Admitting: Physician Assistant

## 2021-08-04 ENCOUNTER — Ambulatory Visit: Payer: Medicare Other | Admitting: Physician Assistant

## 2021-08-04 VITALS — BP 140/70 | HR 65 | Ht 61.0 in | Wt 167.0 lb

## 2021-08-04 DIAGNOSIS — G8929 Other chronic pain: Secondary | ICD-10-CM | POA: Diagnosis not present

## 2021-08-04 DIAGNOSIS — K5730 Diverticulosis of large intestine without perforation or abscess without bleeding: Secondary | ICD-10-CM | POA: Diagnosis not present

## 2021-08-04 DIAGNOSIS — K219 Gastro-esophageal reflux disease without esophagitis: Secondary | ICD-10-CM

## 2021-08-04 DIAGNOSIS — R1013 Epigastric pain: Secondary | ICD-10-CM | POA: Diagnosis not present

## 2021-08-04 DIAGNOSIS — R109 Unspecified abdominal pain: Secondary | ICD-10-CM

## 2021-08-04 NOTE — Patient Instructions (Addendum)
If you are age 80 or older, your body mass index should be between 23-30. Your Body mass index is 31.55 kg/m. If this is out of the aforementioned range listed, please consider follow up with your Primary Care Provider. ________________________________________________________  The [facility] providers would like to encourage you to use [facility] to communicate with providers for non-urgent requests or questions.  Due to long hold times on the telephone, sending your provider a message by [facility] may be a faster and more efficient way to get a response.  Please allow 48 business hours for a response.  Please remember that this is for non-urgent requests.  _______________________________________________________  Start following a Low Gas Diet. Avoid Artificial Sweeteners  START Miralax 1 capful in 8 ounces of water or juice every day  Continue:  Nexium 40 mg daily Dicyclomine 10 mg 2-3 times daily prn  Call the office and speak with Beth if you decide to do the SIBO Breath Test *If you have SIBO, Xifaxan is the antibiotic that would be used to treat this.  Thank you for entrusting me with your care and choosing [facility].  Amy Esterwood, PA-C

## 2021-08-05 ENCOUNTER — Encounter: Payer: Self-pay | Admitting: Physician Assistant

## 2021-08-05 ENCOUNTER — Other Ambulatory Visit (INDEPENDENT_AMBULATORY_CARE_PROVIDER_SITE_OTHER): Payer: Medicare Other

## 2021-08-05 DIAGNOSIS — E1159 Type 2 diabetes mellitus with other circulatory complications: Secondary | ICD-10-CM | POA: Diagnosis not present

## 2021-08-05 DIAGNOSIS — I10 Essential (primary) hypertension: Secondary | ICD-10-CM | POA: Diagnosis not present

## 2021-08-05 DIAGNOSIS — E871 Hypo-osmolality and hyponatremia: Secondary | ICD-10-CM

## 2021-08-05 DIAGNOSIS — E785 Hyperlipidemia, unspecified: Secondary | ICD-10-CM | POA: Diagnosis not present

## 2021-08-05 NOTE — Progress Notes (Signed)
Subjective:    [name], female    DOB: 26-Mar-1940, 81 y.o.   MRN: [id number]  HPI [name] is an 81 year old female, established with Dr. Carlean Purl who comes in today for follow-up and for complaints of abdominal pain. Patient has history of hypertension, PSVT, prior TIA, diabetes mellitus, restless leg syndrome, history of iron deficiency anemia for which she is followed by hematology, she status post cholecystectomy.  She has history of chronic GERD, IBS, remote history of gastroparesis, prior history of small benign gastric carcinoid and chronic abdominal pain/dyspepsia. She was last seen in the office in December 2022 by Dr. Carlean Purl after a bout of diverticulitis. She last underwent colonoscopy in 2012 with finding of severe diverticulosis throughout the colon, otherwise negative exam, and EGD was done in 2018 with multiple diminutive sessile gastric polyps in the fundus and body, otherwise negative exam.  No biopsies done  She says that she started having some issues about a month ago with what she describes as some vague dysphagia and cough that she does not feel is consistent with reflux symptoms, she had symptoms of subxiphoid discomfort Along with This.  Dysphagia symptoms have resolved, no odynophagia and cough has resolved as well. Last week she was experiencing upper abdominal pain, without nausea vomiting fever or chills, some vague nausea. She is also had some alteration in bowel habits, says yesterday she actually had several bowel movements and feels better today.  She has been eating very light, mostly soups and fluids. She also mentions a bandlike pain that she has had intermittently over the past few years and wonders what that is coming from. Has been taking Benefiber on a daily basis, does not use MiraLAX regularly.  She has Bentyl at home but says she may take this once or twice daily, not on a scheduled basis. Uses metoclopramide 5 mg once or twice daily  only if needed when she feels extremely full and stays on Nexium 40 mg chronically. She also has complaints of chronic bloating and gassiness.  Review of Systems Pertinent positive and negative review of systems were noted in the above HPI section.  All other review of systems was otherwise negative.   Outpatient Encounter Medications as of 08/04/2021  Medication Sig   acetaminophen (TYLENOL) 325 MG tablet Take 2 tablets (650 mg total) by mouth every 6 (six) hours as needed for up to 30 doses for mild pain or moderate pain.   amLODipine (NORVASC) 5 MG tablet TAKE 1 TABLET BY MOUTH  DAILY   aspirin EC 81 MG tablet Take 81 mg by mouth daily. Swallow whole.   betamethasone valerate ointment (VALISONE) 0.1 % Apply qod to affected area, small amount   Blood Glucose Monitoring Suppl (ONE TOUCH ULTRA 2) w/Device KIT USE TO CHECK BLOOD SUGAR AS DIRECTED   cholecalciferol (VITAMIN D) 1000 UNITS tablet Take 1,000 Units by mouth daily.   dicyclomine (BENTYL) 10 MG capsule TAKE 1 CAPSULE BY MOUTH 4  TIMES DAILY AS NEEDED   furosemide (LASIX) 20 MG tablet TAKE 1 TABLET BY MOUTH  DAILY AS NEEDED   Lancets (ONETOUCH ULTRASOFT) lancets USE AS DIRECTED 3 TIMES  DAILY   losartan (COZAAR) 50 MG tablet TAKE 1 TABLET BY MOUTH TWICE  DAILY   metFORMIN (GLUCOPHAGE-XR) 500 MG 24 hr tablet TAKE 1 TABLET BY MOUTH IN  THE MORNING AND AT BEDTIME   metoCLOPramide (REGLAN) 5 MG tablet Take 1 tablet (5 mg total) by mouth 2 (two) times daily as  needed.   metoprolol tartrate (LOPRESSOR) 50 MG tablet TAKE 1 AND 1/2 TABLETS BY  MOUTH TWICE DAILY   NEXIUM 40 MG capsule Take 1 capsule (40 mg total) by mouth daily before breakfast.   ondansetron (ZOFRAN) 4 MG tablet Take 4 mg by mouth every 4 (four) hours as needed. For nausea   ONETOUCH ULTRA test strip CHECK BLOOD SUGAR 3 TIMES  DAILY OR AS NEEDED   potassium chloride (KLOR-CON M) 10 MEQ tablet TAKE 1 TABLET BY MOUTH  DAILY   rosuvastatin (CRESTOR) 20 MG tablet TAKE 1 TABLET BY  MOUTH  DAILY   thiamine (VITAMIN B-1) 100 MG tablet Take 100 mg by mouth every other day.   COVID-19 At Home Antigen Test [facility] COVID-19 HOME TEST) KIT Use as directed   No facility-administered encounter medications on file as of 08/04/2021.   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   Renal insufficiency 06/18/2019   Educated about COVID-19 virus infection 05/15/2019   Atrophic vaginitis 01/20/2019   Stenosis of carotid artery 11/12/2018   Anxiety 10/13/2018   Thrush 12/07/2017   Sinusitis 10/11/2017   Headache 07/12/2017   Dizzy spells 11/21/2016   Constipation 11/21/2016   Nonrheumatic aortic valve stenosis 05/23/2016   Aortic atherosclerosis (HCC) 05/23/2016   Hematuria 03/16/2016   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   Pain of upper abdomen 06/06/2015   Neck pain 04/22/2015   Ear pain 02/28/2015   Abnormal urine 11/21/2014   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   Anemia 06/08/2014   RLS (restless legs syndrome) 11/23/2013   Diabetic peripheral neuropathy ([place]) 10/29/2013   Left-sided thoracic back pain 10/06/2013   Encounter for preventative adult health care exam with abnormal findings 09/14/2013   Iron deficiency anemia    Status post laparoscopy 02/25/2013   Hyponatremia 01/09/2013   GERD (gastroesophageal reflux disease) 01/09/2013   Amaurosis fugax of left eye 10/16/2012   Low back pain 06/03/2012   Vitamin D deficiency 03/11/2012   Bilateral hand pain 10/20/2011   Encounter for long-term (current) use of other medications 10/20/2011   IBS (irritable bowel syndrome) 08/14/2011    TIA (transient ischemic attack) 02/10/2011   Abnormal brain CT 01/19/2011   Allergic rhinitis 10/01/2010   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   FUNDIC GLAND POLYPS OF STOMACH 03/18/2010   Abdominal pain in female 03/18/2010   Pain in joint 53/97/6734   SYSTOLIC MURMUR 19/37/9024   Paroxysmal supraventricular tachycardia ([place]) 01/12/2009   PLANTAR FASCIITIS 06/08/2008   CHEST PAIN 05/18/2008   Gastroparesis 12/18/2007   HYPERCHOLESTEROLEMIA 06/11/2007   Diabetes mellitus type 2 in obese ([place]) 09/05/2006   Essential hypertension 09/05/2006   Social History   Socioeconomic History   Marital status: Widowed    Spouse name: Not on file   Number of children: 0   Years of education: college   Highest education level: Not on file  Occupational History   Occupation: retired  Tobacco Use   Smoking status: Never   Smokeless tobacco: Never   Tobacco comments:    Never used tobacco  Vaping Use   Vaping  Use: Never used  Substance and Sexual Activity   Alcohol use: No    Alcohol/week: 0.0 standard drinks of alcohol   Drug use: No   Sexual activity: Never    Partners: Male    Birth control/protection: Post-menopausal    Comment: lives alone, no dietary restrictions except avoid fresh veg, fruit, whole grains  Other Topics Concern   Not on file  Social History Narrative   Patient was married ([name]) - widow   Patient does not have any children.   Patient is right-handed.   Patient has a BA degree.   One caffeine drink daily    Social Determinants of Health   Financial Resource Strain: Low Risk  (03/17/2021)   Overall Financial Resource Strain (CARDIA)    Difficulty of Paying Living Expenses: Not very hard  Food Insecurity: No Food Insecurity (11/09/2020)   Hunger Vital Sign    Worried About Running Out of Food in the Last Year: Never true    Ran Out of Food in the Last Year: Never true  Transportation Needs: No Transportation Needs (11/09/2020)   PRAPARE -  Hydrologist (Medical): No    Lack of Transportation (Non-Medical): No  Physical Activity: Insufficiently Active (03/17/2021)   Exercise Vital Sign    Days of Exercise per Week: 5 days    Minutes of Exercise per Session: 10 min  Stress: No Stress Concern Present (06/07/2021)   [place]    Feeling of Stress : Not at all  Social Connections: Moderately Isolated (06/07/2021)   Social Connection and Isolation Panel [NHANES]    Frequency of Communication with Friends and Family: More than three times a week    Frequency of Social Gatherings with Friends and Family: Once a week    Attends Religious Services: More than 4 times per year    Active Member of [employer] or Organizations: No    Attends Archivist Meetings: Never    Marital Status: Widowed  Intimate Partner Violence: Not At Risk (06/07/2021)   Humiliation, Afraid, Rape, and Kick questionnaire    Fear of Current or Ex-Partner: No    Emotionally Abused: No    Physically Abused: No    Sexually Abused: No    Ms. Iden'[name] family history includes Diabetes in her brother, maternal grandmother, mother, sister, and sister; Heart disease in her brother, brother, sister, sister, and sister; Hyperlipidemia in her brother, sister, and sister; Hypertension in her brother, paternal grandmother, sister, and sister; Intellectual disability in her brother; Stroke in her father.      Objective:    Vitals:   08/04/21 1327  BP: 140/70  Pulse: 65    Physical Exam Well-developed well-nourished elderly female in no acute distress.  Height, Weight, 167 BMI 31.5  HEENT; nontraumatic normocephalic, EOMI, PE R LA, sclera anicteric. Oropharynx; not examined today Neck; supple, no JVD Cardiovascular; regular rate and rhythm with S1-S2, no murmur rub or gallop Pulmonary; Clear bilaterally Abdomen; soft, nondistended, there is no focal tenderness, no  guarding, no palpable mass or hepatosplenomegaly, bowel sounds are active Rectal; not done today Skin; benign exam, no jaundice rash or appreciable lesions Extremities; no clubbing cyanosis or edema skin warm and dry Neuro/Psych; alert and oriented x4, grossly nonfocal mood and affect appropriate , somewhat flat       Assessment & Plan:   #39 81 year old female with history of chronic GERD, diverticulosis, chronic abdominal pain/dyspepsia, history of gastroparesis  who comes in with multiple GI complaints today. GERD symptoms stable on Nexium   Recent episode last week of upper abdominal pain, without nausea vomiting or fever she had some vague nausea, pain has resolved and actually had improvement yesterday after having several bowel movements. I do not think this episode was diverticulitis.  She has chronic dyspepsia, bloating and abdominal discomfort as well as complaints of gassiness and alteration in bowel habits. She is status post cholecystectomy. Exam is benign today  I think most of the symptoms are exacerbation of her dyspepsia/IBS.  She may have SIBO. She  does have extensive diverticulosis as well.  #2 diabetes mellitus 3.  Restless leg syndrome 4.  Prior TIA 5.  History of iron deficiency anemia, followed by hematology #6 hypertension #7 benign gastric fundic gland polyps/mote history of benign gastric carcinoid  Plan; patient advised to start a low gas diet We had long discussion about breath testing for SIBO.  I also offered to give her an empiric course of Xifaxan for probable SIBO.  She says she had been offered breath testing in the past but was not sure that she could complete this by herself at home.  She is reluctant to take Xifaxan empirically. I asked her to start taking MiraLAX 17 g in 8 ounces of water daily as clearly she seems to feel better if her bowels are moving very regularly.  Continue 50 to 60 ounces of water per day. Avoid artificial sweeteners and  any other known triggers She will call back if she decides to proceed with breath testing. Continue Nexium 40 mg p.o. every morning Continue Bentyl 10 mg every 6 hours as needed I also discussed Cymbalta with her which she may benefit from with chronic abdominal pain/dyspepsia.  This had also been mentioned by her neurologist.  She does not want to proceed with this at present. We will plan to follow-up as needed, with Dr. Carlean Purl.   [name] Treg Diemer PA-C 08/05/2021   Cc: [name], MD

## 2021-08-06 LAB — COMPREHENSIVE METABOLIC PANEL
AG Ratio: 1.8 (calc) (ref 1.0–2.5)
ALT: 20 U/L (ref 6–29)
AST: 21 U/L (ref 10–35)
Albumin: 4.6 g/dL (ref 3.6–5.1)
Alkaline phosphatase (APISO): 62 U/L (ref 37–153)
BUN: 23 mg/dL (ref 7–25)
CO2: 24 mmol/L (ref 20–32)
Calcium: 9.9 mg/dL (ref 8.6–10.4)
Chloride: 97 mmol/L — ABNORMAL LOW (ref 98–110)
Creat: 0.91 mg/dL (ref 0.60–0.95)
Globulin: 2.6 g/dL (calc) (ref 1.9–3.7)
Glucose, Bld: 141 mg/dL — ABNORMAL HIGH (ref 65–99)
Potassium: 4.7 mmol/L (ref 3.5–5.3)
Sodium: 132 mmol/L — ABNORMAL LOW (ref 135–146)
Total Bilirubin: 0.7 mg/dL (ref 0.2–1.2)
Total Protein: 7.2 g/dL (ref 6.1–8.1)

## 2021-08-15 ENCOUNTER — Telehealth: Payer: Self-pay

## 2021-08-15 NOTE — Telephone Encounter (Signed)
   Telephone encounter was:  Successful.  08/15/2021 Name: [name] MRN: [id number] DOB: March 17, 1940  [name] is a 81 y.o. year old female who is a primary care patient of [name], MD . The community resource team was consulted for assistance with  senior activities  Care guide performed the following interventions: Patient provided with information about care guide support team and interviewed to confirm resource needs.Follow up for mailed resources, the Patient did receive the resources  Follow Up Plan:  No further follow up planned at this time. The patient has been provided with needed resources.    [place], [facility] Management  [phone] [address], [place], [place] [address] Phone: [phone] Email: [email or web address]

## 2021-08-15 NOTE — Telephone Encounter (Signed)
Went over labs with pt. Provided copy via snail mail.

## 2021-08-30 ENCOUNTER — Other Ambulatory Visit: Payer: Self-pay | Admitting: Cardiology

## 2021-09-06 ENCOUNTER — Encounter: Payer: Self-pay | Admitting: Family

## 2021-09-06 ENCOUNTER — Telehealth: Payer: Self-pay

## 2021-09-06 ENCOUNTER — Other Ambulatory Visit (HOSPITAL_BASED_OUTPATIENT_CLINIC_OR_DEPARTMENT_OTHER): Payer: Self-pay

## 2021-09-06 ENCOUNTER — Ambulatory Visit (INDEPENDENT_AMBULATORY_CARE_PROVIDER_SITE_OTHER): Payer: Medicare Other | Admitting: Family

## 2021-09-06 VITALS — BP 124/60 | HR 61 | Resp 20 | Ht 61.0 in | Wt 169.0 lb

## 2021-09-06 DIAGNOSIS — M5441 Lumbago with sciatica, right side: Secondary | ICD-10-CM

## 2021-09-06 DIAGNOSIS — G8929 Other chronic pain: Secondary | ICD-10-CM

## 2021-09-06 MED ORDER — DICLOFENAC SODIUM 2 % EX SOLN
2.0000 | Freq: Two times a day (BID) | CUTANEOUS | 0 refills | Status: DC | PRN
  Filled 2021-09-06: qty 112, 1d supply, fill #0
  Filled 2021-09-07: qty 112, 30d supply, fill #0

## 2021-09-06 MED ORDER — METHOCARBAMOL 500 MG PO TABS
500.0000 mg | ORAL_TABLET | Freq: Every evening | ORAL | 0 refills | Status: DC | PRN
  Filled 2021-09-06: qty 20, 20d supply, fill #0

## 2021-09-06 MED ORDER — PREDNISONE 20 MG PO TABS
20.0000 mg | ORAL_TABLET | Freq: Every day | ORAL | 0 refills | Status: DC
Start: 2021-09-06 — End: 2021-10-12
  Filled 2021-09-06: qty 5, 5d supply, fill #0

## 2021-09-06 NOTE — Telephone Encounter (Signed)
PA initiated via Covermymeds; KEY: [id number]. Awaiting determination.

## 2021-09-06 NOTE — Progress Notes (Signed)
Britley N Thone is a 81 y.o. 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   Renal insufficiency 06/18/2019   Educated about COVID-19 virus infection 05/15/2019   Atrophic vaginitis 01/20/2019   Stenosis of carotid artery 11/12/2018   Anxiety 10/13/2018   Thrush 12/07/2017   Sinusitis 10/11/2017   Headache 07/12/2017   Dizzy spells 11/21/2016   Constipation 11/21/2016   Nonrheumatic aortic valve stenosis 05/23/2016   Aortic atherosclerosis (HCC) 05/23/2016   Hematuria 03/16/2016   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   Pain of upper abdomen 06/06/2015   Neck pain 04/22/2015   Ear pain 02/28/2015   Abnormal urine 11/21/2014   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   Anemia 06/08/2014   RLS (restless legs syndrome) 11/23/2013   Diabetic peripheral neuropathy ([place]) 10/29/2013   Left-sided thoracic back pain 10/06/2013   Encounter for preventative adult health care exam with abnormal findings 09/14/2013   Iron deficiency anemia    Status post laparoscopy 02/25/2013   Hyponatremia 01/09/2013   GERD (gastroesophageal reflux disease) 01/09/2013   Amaurosis fugax of left eye 10/16/2012   Low back pain 06/03/2012   Vitamin D deficiency 03/11/2012   Bilateral hand pain 10/20/2011   Encounter for long-term (current) use of other medications 10/20/2011   IBS (irritable bowel syndrome) 08/14/2011   TIA (transient ischemic attack) 02/10/2011   Abnormal brain CT 01/19/2011   Allergic rhinitis 10/01/2010   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   FUNDIC GLAND POLYPS OF STOMACH 03/18/2010   Abdominal pain in female  03/18/2010   Pain in joint 01/60/1093   SYSTOLIC MURMUR 23/55/7322   Paroxysmal supraventricular tachycardia ([place]) 01/12/2009   PLANTAR FASCIITIS 06/08/2008   CHEST PAIN 05/18/2008   Gastroparesis 12/18/2007   HYPERCHOLESTEROLEMIA 06/11/2007   Diabetes mellitus type 2 in obese ([place]) 09/05/2006   Essential hypertension 09/05/2006    Current Outpatient Medications  Medication Sig Dispense Refill   acetaminophen (TYLENOL) 325 MG tablet Take 2 tablets (650 mg total) by mouth every 6 (six) hours as needed for up to 30 doses for mild pain or moderate pain. 30 tablet 0   amLODipine (NORVASC) 5 MG tablet TAKE 1 TABLET BY MOUTH  DAILY 90 tablet 3   aspirin EC 81 MG tablet Take 81 mg by mouth daily. Swallow whole.     betamethasone valerate ointment (VALISONE) 0.1 % Apply qod to affected area, small amount 45 g 1   Blood Glucose Monitoring Suppl (ONE TOUCH ULTRA 2) w/Device KIT USE TO CHECK BLOOD SUGAR AS DIRECTED 1 kit 0   cholecalciferol (VITAMIN D) 1000 UNITS tablet Take 1,000 Units by mouth daily.     COVID-19 At Home Antigen Test [facility] COVID-19 HOME TEST) KIT Use as directed 2 kit 0   diclofenac Sodium (PENNSAID) 2 % SOLN Apply 2 Pump (40 mg total) topically 2 (two) times daily as needed. 112 g 0   dicyclomine (BENTYL) 10 MG capsule TAKE 1 CAPSULE BY MOUTH 4  TIMES DAILY AS NEEDED 240 capsule 0   furosemide (LASIX) 20 MG tablet TAKE 1 TABLET BY MOUTH  DAILY AS NEEDED 90 tablet 3   Lancets (ONETOUCH ULTRASOFT) lancets  USE AS DIRECTED 3 TIMES  DAILY 300 each 3   losartan (COZAAR) 50 MG tablet TAKE 1 TABLET BY MOUTH TWICE  DAILY 180 tablet 3   metFORMIN (GLUCOPHAGE-XR) 500 MG 24 hr tablet TAKE 1 TABLET BY MOUTH IN  THE MORNING AND AT BEDTIME 180 tablet 3   methocarbamol (ROBAXIN) 500 MG tablet Take 1 tablet (500 mg total) by mouth at bedtime as needed for muscle spasms. 20 tablet 0   metoCLOPramide (REGLAN) 5 MG tablet Take 1 tablet (5 mg total) by mouth 2 (two) times daily as needed. 180  tablet 0   metoprolol tartrate (LOPRESSOR) 50 MG tablet TAKE 1 AND 1/2 TABLETS BY  MOUTH TWICE DAILY 240 tablet 3   NEXIUM 40 MG capsule Take 1 capsule (40 mg total) by mouth daily before breakfast. 90 capsule 3   ondansetron (ZOFRAN) 4 MG tablet Take 4 mg by mouth every 4 (four) hours as needed. For nausea     ONETOUCH ULTRA test strip CHECK BLOOD SUGAR 3 TIMES  DAILY OR AS NEEDED 300 strip 3   potassium chloride (KLOR-CON M) 10 MEQ tablet TAKE 1 TABLET BY MOUTH  DAILY 90 tablet 3   predniSONE (DELTASONE) 20 MG tablet Take 1 tablet (20 mg total) by mouth daily with breakfast. 5 tablet 0   rosuvastatin (CRESTOR) 20 MG tablet TAKE 1 TABLET BY MOUTH  DAILY 90 tablet 3   thiamine (VITAMIN B-1) 100 MG tablet Take 100 mg by mouth every other day.     No current facility-administered medications for this visit.    Allergies: Tramadol  Past Medical History:  Diagnosis Date   Abdominal pain in female 03/18/2010   Qualifier: Diagnosis of  By: Carlean Purl MD, Tonna Boehringer E    Anemia 06/08/2014   Anxiety    Arthritis    Spinal Osteoarthritis   Cancer ([place])    Carcinoid tumor of stomach    Cataract    Chest pain    Myoview 12/15 no ischemia.   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 ([place]) 09/05/2006   Qualifier: Diagnosis of  By: Marca Ancona RMA, Lucy     Diabetic peripheral neuropathy ([place]) 10/29/2013   Encounter for preventative adult health care exam with abnormal findings 09/14/2013   Esophageal reflux    Gastric polyp    Fundic Gland   Gastroparesis    Headache(784.0)    Heart murmur    Echocardiogram 2/11: EF 60-65%, mild LAE, grade 1 diastolic dysfunction, aortic valve sclerosis, mean gradient 9 mm of mercury, PASP 34   Hematuria 03/16/2016   Iron deficiency anemia, unspecified    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   Leg swelling    bilateral   Neck pain 04/22/2015   PONV (postoperative nausea and vomiting)    pt  states only needs small amount of anesthesia   PSVT (paroxysmal supraventricular tachycardia) ([place])    Pure hypercholesterolemia    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   Renal insufficiency 06/18/2019   Stroke ([place])    tia, 2014   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   Type II or unspecified type diabetes mellitus without mention of complication, not stated as uncontrolled    Unspecified essential hypertension    Unspecified hereditary and idiopathic peripheral neuropathy 10/29/2013    Past Surgical History:  Procedure Laterality Date   CHOLECYSTECTOMY  1993   COLONOSCOPY  11/11/2010   diverticulosis   DILATATION & CURRETTAGE/HYSTEROSCOPY WITH  RESECTOCOPE N/A 02/25/2013   Procedure: Attempted hysteroscopy with uterine perforation;  Surgeon: Jamey Reas de Berton Lan, MD;  Location: [facility];  Service: Gynecology;  Laterality: N/A;   ESOPHAGOGASTRODUODENOSCOPY  08/29/2010; 09/15/2010   Carcinoid tumor less than 1 cm in July 2012 not seen in August 2012 , gastritis, fundic gland polyps   ESOPHAGOGASTRODUODENOSCOPY  05/16/2011   ESOPHAGOGASTRODUODENOSCOPY  06/14/2012   EUS  12/15/2010   Procedure: UPPER ENDOSCOPIC ULTRASOUND (EUS) LINEAR;  Surgeon: Owens Loffler, MD;  Location: [facility];  Service: Endoscopy;  Laterality: N/A;   EYE SURGERY Bilateral    Bi lateral cateracts and bi lateral laser   LAPAROSCOPY N/A 02/25/2013   Procedure: Cystoscopy and laparoscopy with fulguration of uterine serosa;  Surgeon: Jamey Reas de Berton Lan, MD;  Location: [facility];  Service: Gynecology;  Laterality: N/A;   TONSILLECTOMY      Family History  Problem Relation Age of Onset   Diabetes Mother    Stroke Father        deceased age 16   Heart disease Sister        deceased MI age 73   Diabetes Sister    Heart disease Sister    Hypertension Sister    Hyperlipidemia Sister    Diabetes Sister    Heart disease Sister    Hypertension Sister    Hyperlipidemia Sister    Heart disease Brother        deceased MI  age 22   Intellectual disability Brother    Diabetes Brother    Heart disease Brother    Hypertension Brother    Hyperlipidemia Brother    Diabetes Maternal Grandmother    Hypertension Paternal Grandmother    Colon cancer Neg Hx    Esophageal cancer Neg Hx    Stomach cancer Neg Hx    Rectal cancer Neg Hx     Social History   Tobacco Use   Smoking status: Never   Smokeless tobacco: Never   Tobacco comments:    Never used tobacco  Substance Use Topics   Alcohol use: No    Alcohol/week: 0.0 standard drinks of alcohol    Subjective:   Low back pain x 10 days; no specific injury; chronic problems with her back; changing positions is painful; some radiating symptoms into her right leg;  No problems with bowel or bladder;   Asks about getting updated prescription for PT due to history of chronic neck/ back pain; Would also like new prescription for "cream" that sports medicine provider gave her 2-3 years ago;     Objective:  Vitals:   09/06/21 1353  BP: 124/60  Pulse: 61  Resp: 20  SpO2: 97%  Weight: 169 lb (76.7 kg)  Height: _0  (1.549 m)    General: Well developed, well nourished, in no acute distress  Skin : Warm and dry.  Head: Normocephalic and atraumatic  Lungs: Respirations unlabored;  Musculoskeletal: No deformities; no active joint inflammation  Extremities: No edema, cyanosis, clubbing  Vessels: Symmetric bilaterally  Neurologic: Alert and oriented; speech intact; face symmetrical; moves all extremities well; CNII-XII intact without focal deficit   Assessment:  1. Chronic right-sided low back pain with right-sided sciatica     Plan:  Rx for Prednisone 20 mg qd x 5 days, Robaxin 500 mg qhs; referral updated to PT; She is given a refill on the Pennsaid cream that sports medicine prescribed in the past- she understands this may not be covered on  her formulary however.   No follow-ups on file.  Orders Placed This Encounter  Procedures   Ambulatory  referral to Physical Therapy    Referral Priority:   Routine    Referral Type:   Physical Medicine    Referral Reason:   Specialty Services Required    Requested Specialty:   Physical Therapy    Number of Visits Requested:   1    Requested Prescriptions   Signed Prescriptions Disp Refills   predniSONE (DELTASONE) 20 MG tablet 5 tablet 0    Sig: Take 1 tablet (20 mg total) by mouth daily with breakfast.   methocarbamol (ROBAXIN) 500 MG tablet 20 tablet 0    Sig: Take 1 tablet (500 mg total) by mouth at bedtime as needed for muscle spasms.   diclofenac Sodium (PENNSAID) 2 % SOLN 112 g 0    Sig: Apply 2 Pump (40 mg total) topically 2 (two) times daily as needed.

## 2021-09-07 ENCOUNTER — Other Ambulatory Visit (HOSPITAL_BASED_OUTPATIENT_CLINIC_OR_DEPARTMENT_OTHER): Payer: Self-pay

## 2021-09-07 NOTE — Telephone Encounter (Signed)
Pt made aware of denial.

## 2021-09-07 NOTE — Telephone Encounter (Signed)
PA denied.   Diclofenac Sol 2% is not FDA approved for your medical condition(s): low sided back pain. These condition(s) are not supported by one of the accepted references. Therefore your drug is denied because it is not being used for a "medically accepted indication."

## 2021-09-08 ENCOUNTER — Other Ambulatory Visit (HOSPITAL_BASED_OUTPATIENT_CLINIC_OR_DEPARTMENT_OTHER): Payer: Self-pay

## 2021-09-09 ENCOUNTER — Ambulatory Visit (INDEPENDENT_AMBULATORY_CARE_PROVIDER_SITE_OTHER): Payer: Medicare Other | Admitting: Pharmacist

## 2021-09-09 DIAGNOSIS — K219 Gastro-esophageal reflux disease without esophagitis: Secondary | ICD-10-CM

## 2021-09-09 DIAGNOSIS — G8929 Other chronic pain: Secondary | ICD-10-CM

## 2021-09-09 DIAGNOSIS — E871 Hypo-osmolality and hyponatremia: Secondary | ICD-10-CM

## 2021-09-09 DIAGNOSIS — E7800 Pure hypercholesterolemia, unspecified: Secondary | ICD-10-CM

## 2021-09-09 DIAGNOSIS — I10 Essential (primary) hypertension: Secondary | ICD-10-CM

## 2021-09-09 NOTE — Chronic Care Management (AMB) (Signed)
Chronic Care Management Pharmacy Note  09/09/2021 Name:  [name] MRN:  [id number] DOB:  [birth year]  Summary:  Hyperlipidemia: Per Dr Leonie Man LDL gaol < 70. Last LDL decreased from 121 to 88. She has been working on lowering intake of high fat foods. Patient reports she is taking rosuvastatin 51m daily but has not filled since May 26, 2021.  Discussed adherence with patient. Coordinated with pharmacy for refill.   Type 2 DM: Patient's A1c increased to 6.6% when checked 05/31/2021. She states when she was using Continuous Glucose Monitor in past it was easier for her to monitor blood glucose and effects of food on her blood glucose. However Continuous Glucose Monitor is not approved thru her insurance since she is not taking insulin and no current episodes of hypoglycemia. She is taking metformin ER 503mtwice a day. Blood glucose has been little above goal since she has been taking prednisone but I anticipate it will return to normal once she completes course - only has 1 day left.   Back pain: improved a little since started prednisone and methocarbamol 09/06/2021. She did mention she had been drowsy in the morning and getting up much later than usual. Suggested she could take 0.5 tablet of methocarbamol 50027mt night to see if drowsiness improves. Patient will start physical therapy soon. Per patient cost of generic Pennsaid was $1000. Recommended she try over-the-counter Volteran Gel - cost is $10 to $20 per tube.    Plan: follow up in 2 to 3 months.   Subjective: [name] is an [age]. year old female who is a primary patient of BlyMosie LukesD.  The CCM team was consulted for assistance with disease management and care coordination needs.    Engaged with patient by telephone for follow up visit in response to provider referral for pharmacy case management and/or care coordination services.   Consent to Services:  The patient was given information about  Chronic Care Management services, agreed to services, and gave verbal consent prior to initiation of services.  Please see initial visit note for detailed documentation.   Patient Care Team: BlyMosie LukesD as PCP - General (Family Medicine) HocMinus BreedingD as PCP - Cardiology (Cardiology) GesGatha MayerD as Consulting Physician (Gastroenterology) Love, JamAlyson LocketD (Neurology) JafPieter PartridgeO as Consulting Physician (Neurology) EckCherre RobinsPHHopedaleharmacist)  Recent office visits:  09/06/2021 - Int Med (MuValere DrossP) Seen for back pain / sciatica. Prescribed prednisone 92m56mily for 5 days. Robaxin 500mg38m bedtime. and diclofenac cream / soln. Referred to Physical therapy. 07/05/2021 Fam Med (Dr BlythCharlett Blakelow up chronic conditions. Labs checked - noted to have low sodium which might be cause of dizziness. Recheck CMP in 1 month. No med changes noted.  03/08/2021 - PCP (Dr BlythCharlett Blakeeral check up. Labs ordered. No medication changes noted. Referred to ENT and dermatology 02/15/2021 - Fam Med (Dr WendlNani Ravenste visit for sore throat    Recent consult visits:  08/04/2021 - GI (EsterMurdo) [place] for chronic abdominal pain. Advised to start low gas diet. Offered SIBO breath test and Xifaxan course - patient was reluctant. Recommended she take Miralax 17g in 8 ounces of water daily and drink 50 to 60 ounces of water per day. Avoid artificial sweetners. Continued nexium 40mg 77my and bentyl 10mg e42m 6 horus as needed. Aldo discussed Cymbalta for chronic abd. pain / dyspepsia - patient declined. F/U as needed.  07/19/2021 -  GI (Dr Carlean Purl) Phone call regarding abdominal pain. Given appt for 08/04/2021 and instructed to take dicyclomine 2 to 3 times a day if needed 05/24/2021 - Neurology (Dr Leonie Man) Phone Call. patient concerned about topiramate. Patient advised to take 75m once at bedtime for 1 week, then increase to twice a day. 05/04/2021 - Hematology (Eulas Post NP) Seen for  Iron deficiency anemia secondary to malabsorption 03/02/2021- Neurology (Dr SLeonie Man. Seen for nubness of unclear etiology possibly poststroke paresthesias, cervical radiculopathy. Recommended continue ASA for stroke prevention. LDL <70 and A1c < 6.5. Prescribed topiramate 262mdaily for parestesia. F/U in 3 months   Hospital visits:   None in last 6 months  Objective:  Lab Results  Component Value Date   CREATININE 0.91 08/05/2021   CREATININE 0.91 07/05/2021   CREATININE 0.77 05/31/2021    Lab Results  Component Value Date   HGBA1C 6.6 (H) 05/31/2021   Last diabetic Eye exam:  Lab Results  Component Value Date/Time   HMDIABEYEEXA No Retinopathy 05/19/2021 12:00 AM    Last diabetic Foot exam: No results found for: "HMDIABFOOTEX"      Component Value Date/Time   CHOL 178 05/31/2021 0920   TRIG 122.0 05/31/2021 0920   TRIG 94 11/16/2005 0905   HDL 65.70 05/31/2021 0920   CHOLHDL 3 05/31/2021 0920   VLDL 24.4 05/31/2021 0920   LDLCALC 88 05/31/2021 0920   LDLCALC 116 (H) 11/13/2019 0906   LDLDIRECT 95.0 03/25/2020 1149       Latest Ref Rng & Units 08/05/2021    2:15 PM 07/05/2021    3:42 PM 05/31/2021    9:20 AM  Hepatic Function  Total Protein 6.1 - 8.1 g/dL 7.2  7.0  6.8   Albumin 3.5 - 5.2 g/dL  4.5  4.4   AST 10 - 35 U/L 21  19  19    ALT 6 - 29 U/L 20  21  19    Alk Phosphatase 39 - 117 U/L  55  55   Total Bilirubin 0.2 - 1.2 mg/dL 0.7  0.5  0.6     Lab Results  Component Value Date/Time   TSH 3.13 05/31/2021 09:20 AM   TSH 2.80 03/01/2021 09:15 AM   FREET4 0.97 08/08/2019 11:57 AM       Latest Ref Rng & Units 05/31/2021    9:20 AM 05/04/2021    1:45 PM 03/01/2021    9:15 AM  CBC  WBC 4.0 - 10.5 K/uL 8.3  7.7  6.8   Hemoglobin 12.0 - 15.0 g/dL 12.7  12.6  12.7   Hematocrit 36.0 - 46.0 % 38.2  37.5  38.8   Platelets 150.0 - 400.0 K/uL 207.0  224  206.0     Lab Results  Component Value Date/Time   VD25OH 42.22 05/31/2021 09:20 AM   VD25OH 48.67  03/01/2021 09:15 AM    Clinical ASCVD: Yes  The ASCVD Risk score (Arnett DK, et al., 2019) failed to calculate for the following reasons:   The 2019 ASCVD risk score is only valid for ages 4069o 7914  Other:   DEXA 04/26/2020: Femur Neck Right  T-score +0.6. L3 was excluded due to degenerative changes  AP Spine L1-L4 (L3)  Normal +1.9  DualFemur Neck Right Normal +0.6   Social History   Tobacco Use  Smoking Status Never  Smokeless Tobacco Never  Tobacco Comments   Never used tobacco   BP Readings from Last 3 Encounters:  09/06/21 124/60  08/04/21 140/70  07/05/21 122/68   Pulse Readings from Last 3 Encounters:  09/06/21 61  08/04/21 65  07/05/21 62   Wt Readings from Last 3 Encounters:  09/06/21 169 lb (76.7 kg)  08/04/21 167 lb (75.8 kg)  07/05/21 168 lb (76.2 kg)    Assessment: Review of patient past medical history, allergies, medications, health status, including review of consultants reports, laboratory and other test data, was performed as part of comprehensive evaluation and provision of chronic care management services.   SDOH:  (Social Determinants of Health) assessments and interventions performed:      CCM Care Plan  Allergies  Allergen Reactions   Tramadol Other (See Comments)    Dizziness     Medications Reviewed Today     Reviewed by [name], RPH-CPP (Pharmacist) on 09/09/21 at [place] List Status: <None>   Medication Order Taking? Sig Documenting Provider Last Dose Status Informant  acetaminophen (TYLENOL) 325 MG tablet [id number] Yes Take 2 tablets (650 mg total) by mouth every 6 (six) hours as needed for up to 30 doses for mild pain or moderate pain. Wyvonnia Dusky, MD Taking Active   amLODipine (NORVASC) 5 MG tablet [id number] Yes TAKE 1 TABLET BY MOUTH  DAILY Minus Breeding, MD Taking Active   aspirin EC 81 MG tablet [id number] Yes Take 81 mg by mouth daily. Swallow whole. [provider] Taking Active Self   betamethasone valerate ointment (VALISONE) 0.1 % [id number]  Apply qod to affected area, small amount [name], MD  Active   Blood Glucose Monitoring Suppl (ONE TOUCH ULTRA 2) w/Device KIT [id number] Yes USE TO CHECK BLOOD SUGAR AS DIRECTED [name], MD Taking Active   cholecalciferol (VITAMIN D) 1000 UNITS tablet [id number] Yes Take 1,000 Units by mouth daily. [provider] Taking Active Self  COVID-19 At Home Antigen Test [facility] COVID-19 HOME TEST) KIT [id number]  Use as directed Clementeen Graham [facility]  Active   diclofenac Sodium (PENNSAID) 2 % SOLN [id number] Yes Apply 2 Pump (40 mg total) topically 2 (two) times daily as needed. Marrian Salvage, FNP Taking Active   dicyclomine (BENTYL) 10 MG capsule [id number] Yes TAKE 1 CAPSULE BY MOUTH 4  TIMES DAILY AS NEEDED Gatha Mayer, MD Taking Active   furosemide (LASIX) 20 MG tablet [id number] Yes TAKE 1 TABLET BY MOUTH  DAILY AS NEEDED [name], MD Taking Active   Lancets [facility] ULTRASOFT) lancets [id number] Yes USE AS DIRECTED 3 TIMES  DAILY [name], MD Taking Active   losartan (COZAAR) 50 MG tablet [id number] Yes TAKE 1 TABLET BY MOUTH TWICE  DAILY [name], MD Taking Active   metFORMIN (GLUCOPHAGE-XR) 500 MG 24 hr tablet [id number] Yes TAKE 1 TABLET BY MOUTH IN  THE MORNING AND AT BEDTIME [name], MD Taking Active   methocarbamol (ROBAXIN) 500 MG tablet [id number] Yes Take 1 tablet (500 mg total) by mouth at bedtime as needed for muscle spasms. Marrian Salvage, FNP Taking Active   metoCLOPramide (REGLAN) 5 MG tablet [id number] Yes Take 1 tablet (5 mg total) by mouth 2 (two) times daily as needed. Gatha Mayer, MD Taking Active   metoprolol tartrate (LOPRESSOR) 50 MG tablet [id number] Yes TAKE 1 AND 1/2 TABLETS BY  MOUTH TWICE DAILY Minus Breeding, MD Taking Active   NEXIUM 40 MG capsule [id number] Yes Take 1 capsule (40 mg total) by mouth daily before breakfast. Gatha Mayer, MD  Taking Active  Med Note Antony Contras, Nickalus Thornsberry B   Fri Jun 03, 2021  1:26 PM) Using over-the-counter   ondansetron (ZOFRAN) 4 MG tablet [id number] Yes Take 4 mg by mouth every 4 (four) hours as needed. For nausea Renato Shin, MD Taking Active Self  [facility] ULTRA test strip [id number] Yes CHECK BLOOD SUGAR 3 TIMES  DAILY OR AS NEEDED [name], MD Taking Active   potassium chloride (KLOR-CON M) 10 MEQ tablet [id number] Yes TAKE 1 TABLET BY MOUTH  DAILY [name], MD Taking Active   predniSONE (DELTASONE) 20 MG tablet [id number] Yes Take 1 tablet (20 mg total) by mouth daily with breakfast. Marrian Salvage, FNP Taking Active   rosuvastatin (CRESTOR) 20 MG tablet [id number] Yes TAKE 1 TABLET BY MOUTH  DAILY [name], MD Taking Active   thiamine (VITAMIN B-1) 100 MG tablet [id number] Yes Take 100 mg by mouth every other day. 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   Renal insufficiency 06/18/2019   Educated about COVID-19 virus infection 05/15/2019   Atrophic vaginitis 01/20/2019   Stenosis of carotid artery 11/12/2018   Anxiety 10/13/2018   Thrush 12/07/2017   Sinusitis 10/11/2017   Headache 07/12/2017   Dizzy spells 11/21/2016   Constipation 11/21/2016   Nonrheumatic aortic valve stenosis 05/23/2016   Aortic atherosclerosis (HCC) 05/23/2016   Hematuria 03/16/2016   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   Pain of upper abdomen 06/06/2015   Neck pain 04/22/2015   Ear pain 02/28/2015   Abnormal urine 11/21/2014   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   Anemia 06/08/2014   RLS (restless legs syndrome)  11/23/2013   Diabetic peripheral neuropathy ([place]) 10/29/2013   Left-sided thoracic back pain 10/06/2013   Encounter for preventative adult health care exam with abnormal findings 09/14/2013   Iron deficiency anemia    Status post laparoscopy 02/25/2013   Hyponatremia 01/09/2013   GERD (gastroesophageal reflux disease) 01/09/2013   Amaurosis fugax of left eye 10/16/2012   Low back pain 06/03/2012   Vitamin D deficiency 03/11/2012   Bilateral hand pain 10/20/2011   Encounter for long-term (current) use of other medications 10/20/2011   IBS (irritable bowel syndrome) 08/14/2011   TIA (transient ischemic attack) 02/10/2011   Abnormal brain CT 01/19/2011   Allergic rhinitis 10/01/2010   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   FUNDIC GLAND POLYPS OF STOMACH 03/18/2010   Abdominal pain in female 03/18/2010   Pain in joint 25/36/6440   SYSTOLIC MURMUR 34/74/2595   Paroxysmal supraventricular tachycardia ([place]) 01/12/2009   PLANTAR FASCIITIS 06/08/2008   CHEST PAIN 05/18/2008   Gastroparesis 12/18/2007   HYPERCHOLESTEROLEMIA 06/11/2007   Diabetes mellitus type 2 in obese ([place]) 09/05/2006   Essential hypertension 09/05/2006    Immunization History  Administered Date(s) Administered   Fluad Quad(high Dose 65+) 11/18/2019, 12/01/2020   Influenza Split 10/31/2010, 11/28/2011   Influenza Whole 11/19/2007, 11/04/2008, 11/10/2009   Influenza, High Dose Seasonal PF 11/11/2015, 11/21/2016   Influenza,inj,Quad PF,6+ Mos 01/09/2013, 10/20/2013, 11/13/2014, 11/06/2018   PFIZER(Purple Top)SARS-COV-2 Vaccination 03/14/2019, 04/09/2019, 12/29/2019   Pneumococcal Conjugate-13 09/11/2013   Pneumococcal Polysaccharide-23 03/13/2007  Td 03/02/2005    Conditions to be addressed/monitored: HTN, HLD, DMII, and gastroparesis; IBS; GERD; iron malabsorption / anemia; history of low thiamin; neuropathy / trigeminal neuralgia; h/o TIA; carotid artery stenosis;   Care Plan : General Pharmacy  (Adult)  Updates made by [name], RPH-CPP since 09/09/2021 12:00 AM     Problem: HTN, type 2 DM; h/o TIA; hyperlipidemia; aorotic valve stenosis; paroxysmal SVT; hyponatremia; gastroparesis; IBS; GERD; iron malabsortion.   Priority: High  Onset Date: 11/16/2020     Long-Range Goal: Provide education, support and care coordination for medication therapy and chronic conditions   Start Date: 11/16/2020  Priority: High  Note:   Current Barriers:  Unable to independently monitor therapeutic efficacy Unable to achieve control of hyperlipidemia  Unable to maintain control of type 2 diabetes and hypertension Has not made recommended medication changes following last appointment / labs with primary care physician  Pharmacist Clinical Goal(s):  Over the next 90 days, patient will achieve adherence to monitoring guidelines and medication adherence to achieve therapeutic efficacy achieve control of hyperlipidmeia  as evidenced by LDL <70 maintain control of type 2 diabetes and hypertension  as evidenced by A1c <7.0, FBG 80 to 130 and post prandial blood glucose <180 and blood pressure <140/90  adhere to prescribed medication regimen as evidenced by refill history and self reporting  through collaboration with PharmD and provider.   Interventions: 1:1 collaboration with [name], MD regarding development and update of comprehensive plan of care as evidenced by provider attestation and co-signature Inter-disciplinary care team collaboration (see longitudinal plan of care) Comprehensive medication review performed; medication list updated in electronic medical record  Diabetes: A1c is at goal of <7.0%  Lab Results  Component Value Date   HGBA1C 6.6 (H) 05/31/2021  Current treatment: Metformin ER 55m - Take 1 tablet twice a day with food Current glucose readings per patient: 100 to 150                                  Has been seen by endocrinologist in past (Dr ELoanne Drilling when her  husband was still alive. He had type 2 diabetic and required insulin therapy. Current meal patterns: limits salt intake and fast food. She states that she felt she was able to monitor her CHO / food intake better when she was using Continuous Glucose Monitor - not covered per her insurance 06/2021 Interventions:  Reviewed home blood glucose readings and reviewed goals  Fasting blood glucose goal (before meals) = 80 to 130 Blood glucose goal after a meal = less than 180  Education provided about recognition and treatment of hypoglycemia (addressed at previous visit) If you have a low blood sugar less than 70, please eat 15 grams of carbohydrates (4 oz of juice, soda, 4 glucose tablets, or 3-4 pieces of hard candy. 1 teaspoon of honey or sugar).  Wait 15 minutes and then recheck your blood sugar. If your sugar is still less than 70, eat another 15 grams of carbohydrates.  Wait another 15 minutes and recheck your glucose. Continue this until your sugar is over 70.  Once blood glucose is >70 you should eat a snack like crackers with peanut butter, apple with peanut butter or yogurt to prevent your sugar from dropping again. Reminded to get yearly eye exam Continue current therapy for diabetes   Hypertension / Paroxysmal SVT: Controlled per last office visit Blood pressure goal <  140/90  BP Readings from Last 3 Encounters:  09/06/21 124/60  08/04/21 140/70  07/05/21 122/68  Current treatment: Metoprolol 38m twice a day Amlodipine 557mdaily in evening Losartan 5031mwice a day  Home blood pressure readings:  SBP 105 to 125 DBP 60 to 70 Denies hypotensive/hypertensive symptoms Interventions:  Reviewed blood pressure goals Continue to check blood pressure at home once a day Reminded patient to make sure she has sat / rested for at least 5 mintues before checking blood pressure at home.  Continue current blood pressure medications  Hyperlipidemia / history of TIA / stenosis of carotid  artery: Uncontrolled  but LDL is lower LDL goal < 70 Current treatment:  Rosuvastatin 73m31mily Aspirin 81mg19mly  Interventions:  Reviewed refill history for rosuvastatin. Last refilled #90 06/03/2021 - assisted in requesting refill today. Recommended limit intake of saturated and trans fat.    Gastroparesis / Irritable Bowel Syndrome / Acid reflux:  Managed by Dr GessnCarlean Purlfollowing any specific diet related to gastroparesis.  Current treatment:  Ondansetron 4mg -68mke 1 tablet every 4 hour as needed for nausea Dicyclomine 10mg u11m 4 times a day as needed (taking less than daily)  Esomeprazole 40mg da62mbefore breakfast (not taking regularly) Metoclopramide 5mg twic70m day Interventions:  Eat small frequent meals due to gastroparesis Can take esomeprazole 20 or 40mg over65m counter for acid reflux Also can take pepcid / famotidine 73mg at ni62mif needed for acid reflux  Neuropathy / trigeminal neuralgia:  Patient saw Dr Sethi for fLeonie Man pain related to trigeminal neuralgia. He prescribed topiramate 25mg twice 2my but she has not started due to concerns with side effects.  Has also tried: duloxetine 08/11/2020 and gabapentin (dizziness and drowsiness) Current treatment:  none Interventions:  Continue to follow up with Dr Sethi ContinLeonie Manth stretches recommended by Dr Sethi.  AnemLeonie ManLow B1 (thiamin):  Managed by hematology but patient has not been in awhile due to past cost of IV iron infusions  Last CBC Lab Results  Component Value Date   WBC 8.3 05/31/2021   HGB 12.7 05/31/2021   HCT 38.2 05/31/2021   MCV 85.6 05/31/2021   MCH 28.6 05/04/2021   RDW 12.8 05/31/2021   PLT 207.0 05/31/2021  Last B1 was back to normal at 14 (03/01/2021) Taking thiamin / B1 100mg every o52m day  Dr Blyth recommeCharlett Blakecut dose of thiamin in half.  Interventions:   Continue current dose of thiamine  Recheck thiamine level in 6 to 12 months  Health Maintenance:  Reviewed  vaccination history  Plan to get Shingrix series in 2023.  Encouraged patient to start walking daily - 5 to 10 minues and increase as able; Continue stretching exercises recommended by Dr Sethi  PatienLeonie Manls/Self-Care Activities Over the next 90 days, patient will:  take medications as prescribed,  check glucose daily , document, and provide at future appointments,  check blood pressure daily, document, and provide at future appointments Start physical therapy as recommended If whole tablet of methocarbamol 500mg is makin56mu sleepy then you can try taking 0.5 tablet at bedtime Try over the counter Voltaren / diclofenac gel in place of Pennsaid. Apply up to 4 times a day to area of pain  Follow Up Plan: Telephone follow up appointment with care management team member scheduled for:  2 to 3 months            Medication Assistance: None required.  Patient affirms current  coverage meets needs.  Patient's preferred pharmacy is:  [employer] ([place], [place] [facility] [address] [place] [address] [place] [address] Phone: [phone] Fax: [phone]  [place], [place] [address] [place] [address] Phone: [phone] Fax: [phone]  [place] [address], [place] [address] Phone: [phone] Fax: [phone]  [facility] ([facility] ) - [place], [place] [address] [place] [address] Phone: [phone] Fax: [phone]   Follow Up:  Patient agrees to Care Plan and Follow-up.  Plan: Telephone follow up appointment with care management team member scheduled for:  2 months  [name], PharmD Clinical Pharmacist [facility] [place] [facility]

## 2021-09-09 NOTE — Patient Instructions (Signed)
[name] It was a pleasure speaking with you today.  Below is a summary of your health goals and our recent visit. You can also view your updated Chronic Care Management Care plan through your MyChart account.   Patient Goals/Self-Care Activities Over the next 90 days, patient will:  take medications as prescribed,  check glucose daily , document, and provide at future appointments,  check blood pressure daily, document, and provide at future appointments Start physical therapy as recommended If whole tablet of methocarbamol '500mg'$  is making you sleepy then you can try taking 0.5 tablet at bedtime Try over the counter Voltaren / diclofenac gel in place of Pennsaid. Apply up to 4 times a day to area of pain    As always if you have any questions or concerns especially regarding medications, please feel free to contact me either at the phone number below or with a MyChart message.   Keep up the good work!  [name], PharmD Clinical Pharmacist [place] [phone] (direct line)  [phone] (main office number)   Patient verbalizes understanding of instructions and care plan provided today and agrees to view in [place]. Active MyChart status and patient understanding of how to access instructions and care plan via MyChart confirmed with patient.

## 2021-09-12 ENCOUNTER — Other Ambulatory Visit: Payer: Self-pay | Admitting: Internal Medicine

## 2021-09-15 ENCOUNTER — Ambulatory Visit: Payer: Medicare Other | Admitting: Physical Therapy

## 2021-09-21 ENCOUNTER — Encounter: Payer: Self-pay | Admitting: Physical Therapy

## 2021-09-21 ENCOUNTER — Other Ambulatory Visit: Payer: Self-pay

## 2021-09-21 ENCOUNTER — Telehealth: Payer: Self-pay | Admitting: Family Medicine

## 2021-09-21 ENCOUNTER — Ambulatory Visit: Payer: Medicare Other | Attending: Family | Admitting: Physical Therapy

## 2021-09-21 DIAGNOSIS — M5441 Lumbago with sciatica, right side: Secondary | ICD-10-CM | POA: Insufficient documentation

## 2021-09-21 DIAGNOSIS — R262 Difficulty in walking, not elsewhere classified: Secondary | ICD-10-CM | POA: Insufficient documentation

## 2021-09-21 DIAGNOSIS — M5459 Other low back pain: Secondary | ICD-10-CM | POA: Insufficient documentation

## 2021-09-21 DIAGNOSIS — G8929 Other chronic pain: Secondary | ICD-10-CM | POA: Diagnosis not present

## 2021-09-21 DIAGNOSIS — M62830 Muscle spasm of back: Secondary | ICD-10-CM | POA: Insufficient documentation

## 2021-09-21 DIAGNOSIS — R2689 Other abnormalities of gait and mobility: Secondary | ICD-10-CM | POA: Diagnosis not present

## 2021-09-21 DIAGNOSIS — M6281 Muscle weakness (generalized): Secondary | ICD-10-CM | POA: Insufficient documentation

## 2021-09-21 NOTE — Telephone Encounter (Signed)
Called pt about OV and Lab appointment

## 2021-09-21 NOTE — Therapy (Signed)
OUTPATIENT PHYSICAL THERAPY THORACOLUMBAR EVALUATION   Patient Name: [name] MRN: [id number] DOB:[birth year], 81 y.o., female Today's Date: 09/21/2021   PT End of Session - 09/21/21 1145     Visit Number 1    Date for PT Re-Evaluation 11/16/21    Authorization Type Medicare    PT Start Time 1145    PT Stop Time 1254    PT Time Calculation (min) 69 min    Activity Tolerance Patient tolerated treatment well;Patient limited by pain    Behavior During Therapy [facility] for tasks assessed/performed             Past Medical History:  Diagnosis Date   Abdominal pain in female 03/18/2010   Qualifier: Diagnosis of  By: Carlean Purl MD, Tonna Boehringer E    Anemia 06/08/2014   Anxiety    Arthritis    Spinal Osteoarthritis   Cancer ([place])    Carcinoid tumor of stomach    Cataract    Chest pain    Myoview 12/15 no ischemia.   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 ([place]) 09/05/2006   Qualifier: Diagnosis of  By: Marca Ancona RMA, Lucy     Diabetic peripheral neuropathy ([place]) 10/29/2013   Encounter for preventative adult health care exam with abnormal findings 09/14/2013   Esophageal reflux    Gastric polyp    Fundic Gland   Gastroparesis    Headache(784.0)    Heart murmur    Echocardiogram 2/11: EF 60-65%, mild LAE, grade 1 diastolic dysfunction, aortic valve sclerosis, mean gradient 9 mm of mercury, PASP 34   Hematuria 03/16/2016   Iron deficiency anemia, unspecified    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   Leg swelling    bilateral   Neck pain 04/22/2015   PONV (postoperative nausea and vomiting)    pt states only needs small amount of anesthesia   PSVT (paroxysmal supraventricular tachycardia) ([place])    Pure hypercholesterolemia    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   Renal insufficiency 06/18/2019   Stroke ([place])    tia, 2014   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   Type II or unspecified type diabetes mellitus without mention of  complication, not stated as uncontrolled    Unspecified essential hypertension    Unspecified hereditary and idiopathic peripheral neuropathy 10/29/2013   Past Surgical History:  Procedure Laterality Date   CHOLECYSTECTOMY  1993   COLONOSCOPY  11/11/2010   diverticulosis   DILATATION & CURRETTAGE/HYSTEROSCOPY WITH RESECTOCOPE N/A 02/25/2013   Procedure: Attempted hysteroscopy with uterine perforation;  Surgeon: Jamey Reas de Berton Lan, MD;  Location: [facility];  Service: Gynecology;  Laterality: N/A;   ESOPHAGOGASTRODUODENOSCOPY  08/29/2010; 09/15/2010   Carcinoid tumor less than 1 cm in July 2012 not seen in August 2012 , gastritis, fundic gland polyps   ESOPHAGOGASTRODUODENOSCOPY  05/16/2011   ESOPHAGOGASTRODUODENOSCOPY  06/14/2012   EUS  12/15/2010   Procedure: UPPER ENDOSCOPIC ULTRASOUND (EUS) LINEAR;  Surgeon: Owens Loffler, MD;  Location: [facility];  Service: Endoscopy;  Laterality: N/A;   EYE SURGERY Bilateral    Bi lateral cateracts and bi lateral laser   LAPAROSCOPY N/A 02/25/2013   Procedure: Cystoscopy and laparoscopy with fulguration of uterine serosa;  Surgeon: Jamey Reas de Berton Lan, MD;  Location: [facility];  Service: Gynecology;  Laterality: N/A;   TONSILLECTOMY     Patient Active Problem List   Diagnosis Date Noted   Oral lesion  03/08/2021   Sun-damaged skin 03/08/2021   Thiamine deficiency 11/01/2020   Trigeminal neuralgia 08/21/2020   Pedal edema 08/21/2020   Chronic left-sided back pain 03/28/2020   Nocturia 03/28/2020   Left-sided headache 03/28/2020   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   Renal insufficiency 06/18/2019   Educated about COVID-19 virus infection 05/15/2019   Atrophic vaginitis 01/20/2019   Stenosis of carotid artery 11/12/2018   Anxiety 10/13/2018   Thrush 12/07/2017   Sinusitis 10/11/2017   Headache 07/12/2017   Dizzy spells 11/21/2016   Constipation 11/21/2016   Nonrheumatic aortic valve stenosis 05/23/2016   Aortic  atherosclerosis (HCC) 05/23/2016   Hematuria 03/16/2016   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   Pain of upper abdomen 06/06/2015   Neck pain 04/22/2015   Ear pain 02/28/2015   Abnormal urine 11/21/2014   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   Anemia 06/08/2014   RLS (restless legs syndrome) 11/23/2013   Diabetic peripheral neuropathy ([place]) 10/29/2013   Left-sided thoracic back pain 10/06/2013   Encounter for preventative adult health care exam with abnormal findings 09/14/2013   Iron deficiency anemia    Status post laparoscopy 02/25/2013   Hyponatremia 01/09/2013   GERD (gastroesophageal reflux disease) 01/09/2013   Amaurosis fugax of left eye 10/16/2012   Low back pain 06/03/2012   Vitamin D deficiency 03/11/2012   Bilateral hand pain 10/20/2011   Encounter for long-term (current) use of other medications 10/20/2011   IBS (irritable bowel syndrome) 08/14/2011   TIA (transient ischemic attack) 02/10/2011   Abnormal brain CT 01/19/2011   Allergic rhinitis 10/01/2010   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   FUNDIC GLAND POLYPS OF STOMACH 03/18/2010   Abdominal pain in female 03/18/2010   Pain in joint 73/71/0626   SYSTOLIC MURMUR 94/85/4627   Paroxysmal supraventricular tachycardia ([place]) 01/12/2009   PLANTAR FASCIITIS 06/08/2008   CHEST PAIN 05/18/2008   Gastroparesis 12/18/2007   HYPERCHOLESTEROLEMIA 06/11/2007   Diabetes mellitus type 2 in obese ([place]) 09/05/2006   Essential hypertension 09/05/2006    PCP: [name], MD  REFERRING PROVIDER: Marrian Salvage, FNP  REFERRING DIAG: [phone] (ICD-10-CM) - Chronic right-sided low back pain with right-sided sciatica  RATIONALE FOR EVALUATION AND TREATMENT: Rehabilitation  THERAPY DIAG:  Other low back pain  Muscle spasm of back  Muscle weakness (generalized)  Difficulty in walking, not elsewhere classified  Other abnormalities of gait and mobility  ONSET DATE: acute on chronic  exacerbation ~3 weeks ago  NEXT MD VISIT: 10/13/21 with PCP   SUBJECTIVE:  SUBJECTIVE STATEMENT: Pt reports worsening of her chronic LBP ~3 weeks ago with increased radicular pain/burning down her R with numbness in B feet. She states she has tried her exercises from prior PT episode with some relief but pain still and issue. Also states that she feel very weak. Pain worst with sitting and feels somewhat better when standing or walking around.   PAIN:  Are you having pain? Yes: NPRS scale: 4-5/10 Pain location: R>L low back with R LE radicular pain and burning sensation Pain description: burning, numbness Aggravating factors: prolonged sitting, bending forward, driving, prolonged standing or walking Relieving factors: heat, hot shower, topical pain cream  PERTINENT HISTORY: Extensive PMH including chronic back and neck pain, OA, osteoporosis, DM, HTN, h/o TIA, diabetic peripheral neuropathy, PVD, headaches, sleep dysfunction, anxiety (refer to problem list and past medical/surgical history for full PMH)  PRECAUTIONS: None  WEIGHT BEARING RESTRICTIONS: No  FALLS:  Has patient fallen in last 6 months? No  LIVING ENVIRONMENT: Lives with: lives alone Lives in: House/apartment Stairs: No Has following equipment at home: Single point cane, Environmental consultant - 4 wheeled, shower chair, and Grab bars  OCCUPATION: Retired  PLOF: Independent and Leisure: read and play card games on her phone, has been doing her prior PT HEP recently qod since pain started up  PATIENT GOALS: "No pain."   OBJECTIVE:   DIAGNOSTIC FINDINGS:  05/25/20 - Lumbar x-ray:  FINDINGS: Decreased osseous mineralization. Stable lumbar vertebral body heights and alignment. Mild disc space narrowing is similar. Small endplate osteophytes are  present at L2-L3 and L3-L4. Mild facet hypertrophy. Aortic calcification. IMPRESSION: Mild lumbar spondylosis similar to prior study (2018).  PATIENT SURVEYS:  FOTO Lumbar = 28; predicted = 46  SCREENING FOR RED FLAGS: Bowel or bladder incontinence: No Spinal tumors: No Cauda equina syndrome: No Compression fracture: No Abdominal aneurysm: No  COGNITION:  Overall cognitive status: Within functional limits for tasks assessed     SENSATION: Light touch: Impaired  numbness in B feet  MUSCLE LENGTH: Hamstrings: mod tight R>L ITB: mod tight R>L Piriformis: mod tight B Hip flexors: NT Quads: NT Heelcord: NT  POSTURE:  rounded shoulders, forward head, decreased lumbar lordosis, and increased thoracic kyphosis  PALPATION: Increased muscle tension and TTP over lumbar paraspinals and R glutes/piriformis and lateral hip  LUMBAR ROM:   Active  AROM  eval  Flexion Hands level with knees  Extension 90% limited  Right lateral flexion Hand to mid thigh  Left lateral flexion Hand to mid thigh  Right rotation 80% limited  Left rotation 80% limited    LOWER EXTREMITY ROM:     Mildly limited primarily due to muscle tightness  LOWER EXTREMITY MMT:    MMT Right eval Left eval  Hip flexion 3+ 3+  Hip extension 3- 3-  Hip abduction 4- 3+  Hip adduction 3+ 3+  Hip internal rotation 3+ 3+  Hip external rotation 3+ 3+  Knee flexion 4- 4-  Knee extension 4- 4-  Ankle dorsiflexion 3+ 3+  Ankle plantarflexion 3- 3-  Ankle inversion    Ankle eversion     (Blank rows = not tested)  LUMBAR SPECIAL TESTS:  Straight leg raise test: Positive on Right   TODAY'S TREATMENT:   09/21/21 MANUAL THERAPY: To promote normalized muscle tension and reduced pain. STM and manual TPR to R glute medius/minimus  THERAPEUTIC EXERCISE: Instruction in initial HEP (see below) to improve flexibility, strength and mobility.  Verbal and tactile cues throughout for technique. R/L [facility]  stretch 2 x  30" R/L KTOS piriformis stretch 2 x 30" B LTR 5 x 10" PPT 10 x 5"    PATIENT EDUCATION:  Education details: PT eval findings, anticipated POC, and initial HEP Person educated: Patient Education method: Explanation, Demonstration, Verbal cues, and Handouts Education comprehension: verbalized understanding, returned demonstration, verbal cues required, and needs further education   HOME EXERCISE PROGRAM: Access Code: [email or web address] URL: [email or web address] Date: 09/21/2021 Prepared by: Annie Paras  Exercises - Hooklying Single Knee to Chest Stretch  - 2-3 x daily - 7 x weekly - 3 reps - 30 sec hold - Supine Piriformis Stretch with Foot on Ground  - 2-3 x daily - 7 x weekly - 3 reps - 30 sec hold - Supine Lower Trunk Rotation  - 2-3 x daily - 7 x weekly - 5 reps - 10 sec hold - Supine Posterior Pelvic Tilt  - 2-3 x daily - 7 x weekly - 2 sets - 10 reps - 5 sec hold   ASSESSMENT:  CLINICAL IMPRESSION: [name] is a 81 y.o. female who was seen today for physical therapy evaluation and treatment for acute on chronic LBP with R-sided sciatica. She reports worsening of her chronic LBP ~3 weeks ago with increased radicular pain/burning down her R with numbness in B feet. She has completed 2 prior PT episodes for LBP at this clinic in 2017 and 2018, and she states she has tried her exercises from the prior PT episodes with some relief but pain still significant.  She leads mostly sedentary lifestyle spending most of her time reading or sitting at her compute. Her pain is worse with sitting and feels somewhat better when standing or walking around but only for a short while before pain starts to increase again. Deficits include constant LBP with near constant R LE radicular pain, severely limited lumbar ROM in all directions, abnormal posture, increased muscle tension and decreased proximal LE flexibility, and moderate B LE weakness. Positive SLR test bilaterally, but more  symptomatic on R. Willetta will benefit from skilled PT to address above deficits and allow for improved positional and activity tolerance with decreased pain interference.   OBJECTIVE IMPAIRMENTS: decreased activity tolerance, decreased endurance, decreased knowledge of condition, decreased mobility, difficulty walking, decreased ROM, decreased strength, hypomobility, increased fascial restrictions, impaired perceived functional ability, increased muscle spasms, impaired flexibility, impaired sensation, improper body mechanics, postural dysfunction, and pain.   ACTIVITY LIMITATIONS: carrying, lifting, bending, sitting, standing, squatting, sleeping, stairs, transfers, bed mobility, bathing, toileting, dressing, reach over head, and locomotion level  PARTICIPATION LIMITATIONS: meal prep, cleaning, laundry, driving, shopping, community activity, and yard work  PERSONAL FACTORS: Age, Fitness, Past/current experiences, Time since onset of injury/illness/exacerbation, and 3+ comorbidities: Extensive PMH including chronic back and neck pain, OA, osteoporosis, DM, HTN, h/o TIA, diabetic peripheral neuropathy, PVD, headaches, sleep dysfunction, anxiety (refer to problem list and past medical/surgical history for full PMH)  are also affecting patient's functional outcome.   REHAB POTENTIAL: Good  CLINICAL DECISION MAKING: Evolving/moderate complexity  EVALUATION COMPLEXITY: Moderate   GOALS: Goals reviewed with patient? Yes  SHORT TERM GOALS: Target date: 10/19/2021   Patient will be independent with initial HEP to improve outcomes and carryover.  Baseline:  Goal status: INITIAL  2.  Patient will report centralization of radicular symptoms.  Baseline:  Goal status: INITIAL  LONG TERM GOALS: Target date: 11/16/2021    Patient will be independent with ongoing/advanced HEP for self-management at home.  Baseline:  Goal status: INITIAL  2.  Patient will report 50-75% improvement in low back  pain to improve QOL.  Baseline:  Goal status: INITIAL  3.  Patient to demonstrate ability to achieve and maintain good spinal alignment/posturing and body mechanics needed for daily activities. Baseline:  Goal status: INITIAL  4.  Patient will demonstrate functional pain free lumbar ROM to perform ADLs.   Baseline:  Goal status: INITIAL  5.  Patient will demonstrate improved B LE strength to >/= 4/5 for improved stability and ease of mobility . Baseline:  Goal status: INITIAL  6.  Patient will report 19 on lumbar FOTO to demonstrate improved functional ability.  Baseline: 28 Goal status: INITIAL   7.  Patient will tolerate 20-30 min of sitting w/o increased pain to improve comfort with eating meals or driving. Baseline:  Goal status: INITIAL   PLAN: PT FREQUENCY: 2x/week  PT DURATION: 8 weeks  PLANNED INTERVENTIONS: Therapeutic exercises, Therapeutic activity, Neuromuscular re-education, Balance training, Gait training, Patient/Family education, Self Care, Joint mobilization, Stair training, Aquatic Therapy, Dry Needling, Electrical stimulation, Spinal mobilization, Cryotherapy, Moist heat, Taping, Traction, Ultrasound, Ionotophoresis '4mg'$ /ml Dexamethasone, Manual therapy, and Re-evaluation.  PLAN FOR NEXT SESSION: Review initial HEP; gently progress lumbopelvic flexibility and strengthening; posture and body mechanics education; MT +/- DN to address pain and abnormal muscle tension; modalities PRN for pain    Percival Spanish, PT 09/21/2021, 1:42 PM

## 2021-09-21 NOTE — Telephone Encounter (Signed)
Patient states she has an OV visit on 09/07 with Dr. Charlett Blake and she also has an appt on 09/05 for labs that she gets done before her appt. No future labs from [facility] were found. Patient would like a call to know if she needs labs before her appt or not. She stated she does not want a mychart message, she would like someone to call her. Please advise

## 2021-09-26 ENCOUNTER — Encounter: Payer: Self-pay | Admitting: Physical Therapy

## 2021-09-26 ENCOUNTER — Ambulatory Visit: Payer: Medicare Other | Admitting: Neurology

## 2021-09-26 ENCOUNTER — Ambulatory Visit: Payer: Medicare Other | Admitting: Physical Therapy

## 2021-09-26 DIAGNOSIS — R262 Difficulty in walking, not elsewhere classified: Secondary | ICD-10-CM

## 2021-09-26 DIAGNOSIS — R2689 Other abnormalities of gait and mobility: Secondary | ICD-10-CM

## 2021-09-26 DIAGNOSIS — M6281 Muscle weakness (generalized): Secondary | ICD-10-CM

## 2021-09-26 DIAGNOSIS — G8929 Other chronic pain: Secondary | ICD-10-CM | POA: Diagnosis not present

## 2021-09-26 DIAGNOSIS — M5441 Lumbago with sciatica, right side: Secondary | ICD-10-CM | POA: Diagnosis not present

## 2021-09-26 DIAGNOSIS — M62830 Muscle spasm of back: Secondary | ICD-10-CM

## 2021-09-26 DIAGNOSIS — M5459 Other low back pain: Secondary | ICD-10-CM

## 2021-09-26 NOTE — Therapy (Addendum)
OUTPATIENT PHYSICAL THERAPY TREATMENT   Patient Name: [name] MRN: [id number] DOB:1940-07-11, 81 y.o., female Today's Date: 09/26/2021   PT End of Session - 09/26/21 1316     Visit Number 2    Date for PT Re-Evaluation 11/16/21    Authorization Type Medicare    PT Start Time 1316    PT Stop Time 1404    PT Time Calculation (min) 48 min    Activity Tolerance Patient tolerated treatment well    Behavior During Therapy [facility] for tasks assessed/performed              Past Medical History:  Diagnosis Date   Abdominal pain in female 03/18/2010   Qualifier: Diagnosis of  By: Carlean Purl MD, Tonna Boehringer E    Anemia 06/08/2014   Anxiety    Arthritis    Spinal Osteoarthritis   Cancer ([place])    Carcinoid tumor of stomach    Cataract    Chest pain    Myoview 12/15 no ischemia.   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 ([place]) 09/05/2006   Qualifier: Diagnosis of  By: Marca Ancona RMA, Lucy     Diabetic peripheral neuropathy ([place]) 10/29/2013   Encounter for preventative adult health care exam with abnormal findings 09/14/2013   Esophageal reflux    Gastric polyp    Fundic Gland   Gastroparesis    Headache(784.0)    Heart murmur    Echocardiogram 2/11: EF 60-65%, mild LAE, grade 1 diastolic dysfunction, aortic valve sclerosis, mean gradient 9 mm of mercury, PASP 34   Hematuria 03/16/2016   Iron deficiency anemia, unspecified    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   Leg swelling    bilateral   Neck pain 04/22/2015   PONV (postoperative nausea and vomiting)    pt states only needs small amount of anesthesia   PSVT (paroxysmal supraventricular tachycardia) ([place])    Pure hypercholesterolemia    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   Renal insufficiency 06/18/2019   Stroke ([place])    tia, 2014   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   Type II or unspecified type diabetes mellitus without mention of complication, not stated as uncontrolled     Unspecified essential hypertension    Unspecified hereditary and idiopathic peripheral neuropathy 10/29/2013   Past Surgical History:  Procedure Laterality Date   CHOLECYSTECTOMY  1993   COLONOSCOPY  11/11/2010   diverticulosis   DILATATION & CURRETTAGE/HYSTEROSCOPY WITH RESECTOCOPE N/A 02/25/2013   Procedure: Attempted hysteroscopy with uterine perforation;  Surgeon: Jamey Reas de Berton Lan, MD;  Location: [facility];  Service: Gynecology;  Laterality: N/A;   ESOPHAGOGASTRODUODENOSCOPY  08/29/2010; 09/15/2010   Carcinoid tumor less than 1 cm in July 2012 not seen in August 2012 , gastritis, fundic gland polyps   ESOPHAGOGASTRODUODENOSCOPY  05/16/2011   ESOPHAGOGASTRODUODENOSCOPY  06/14/2012   EUS  12/15/2010   Procedure: UPPER ENDOSCOPIC ULTRASOUND (EUS) LINEAR;  Surgeon: Owens Loffler, MD;  Location: [facility];  Service: Endoscopy;  Laterality: N/A;   EYE SURGERY Bilateral    Bi lateral cateracts and bi lateral laser   LAPAROSCOPY N/A 02/25/2013   Procedure: Cystoscopy and laparoscopy with fulguration of uterine serosa;  Surgeon: Jamey Reas de Berton Lan, MD;  Location: [facility];  Service: Gynecology;  Laterality: N/A;   TONSILLECTOMY     Patient Active Problem List   Diagnosis Date Noted   Oral lesion 03/08/2021  Sun-damaged skin 03/08/2021   Thiamine deficiency 11/01/2020   Trigeminal neuralgia 08/21/2020   Pedal edema 08/21/2020   Chronic left-sided back pain 03/28/2020   Nocturia 03/28/2020   Left-sided headache 03/28/2020   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   Renal insufficiency 06/18/2019   Educated about COVID-19 virus infection 05/15/2019   Atrophic vaginitis 01/20/2019   Stenosis of carotid artery 11/12/2018   Anxiety 10/13/2018   Thrush 12/07/2017   Sinusitis 10/11/2017   Headache 07/12/2017   Dizzy spells 11/21/2016   Constipation 11/21/2016   Nonrheumatic aortic valve stenosis 05/23/2016   Aortic atherosclerosis ([facility]) 05/23/2016   Hematuria  03/16/2016   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   Pain of upper abdomen 06/06/2015   Neck pain 04/22/2015   Ear pain 02/28/2015   Abnormal urine 11/21/2014   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   Anemia 06/08/2014   RLS (restless legs syndrome) 11/23/2013   Diabetic peripheral neuropathy ([place]) 10/29/2013   Left-sided thoracic back pain 10/06/2013   Encounter for preventative adult health care exam with abnormal findings 09/14/2013   Iron deficiency anemia    Status post laparoscopy 02/25/2013   Hyponatremia 01/09/2013   GERD (gastroesophageal reflux disease) 01/09/2013   Amaurosis fugax of left eye 10/16/2012   Low back pain 06/03/2012   Vitamin D deficiency 03/11/2012   Bilateral hand pain 10/20/2011   Encounter for long-term (current) use of other medications 10/20/2011   IBS (irritable bowel syndrome) 08/14/2011   TIA (transient ischemic attack) 02/10/2011   Abnormal brain CT 01/19/2011   Allergic rhinitis 10/01/2010   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   FUNDIC GLAND POLYPS OF STOMACH 03/18/2010   Abdominal pain in female 03/18/2010   Pain in joint 36/64/4034   SYSTOLIC MURMUR 74/25/9563   Paroxysmal supraventricular tachycardia ([place]) 01/12/2009   PLANTAR FASCIITIS 06/08/2008   CHEST PAIN 05/18/2008   Gastroparesis 12/18/2007   HYPERCHOLESTEROLEMIA 06/11/2007   Diabetes mellitus type 2 in obese ([place]) 09/05/2006   Essential hypertension 09/05/2006    PCP: [name], MD  REFERRING PROVIDER: Marrian Salvage, FNP  REFERRING DIAG: [phone] (ICD-10-CM) - Chronic right-sided low back pain with right-sided sciatica  RATIONALE FOR EVALUATION AND TREATMENT: Rehabilitation  THERAPY DIAG:  Other low back pain  Muscle spasm of back  Muscle weakness (generalized)  Difficulty in walking, not elsewhere classified  Other abnormalities of gait and mobility  ONSET DATE: acute on chronic exacerbation ~3 weeks ago  NEXT MD VISIT:  10/13/21 with PCP   SUBJECTIVE:  SUBJECTIVE STATEMENT: Pt reports her pain is bad with driving. She states initial HEP "went okay".  PAIN:  Are you having pain? Yes: NPRS scale: 4-5/10 Pain location: R>L low back with R LE radicular pain and burning sensation Pain description: burning, numbness Aggravating factors: prolonged sitting, bending forward, driving, prolonged standing or walking Relieving factors: heat, hot shower, topical pain cream  PERTINENT HISTORY: Extensive PMH including chronic back and neck pain, OA, osteoporosis, DM, HTN, h/o TIA, diabetic peripheral neuropathy, PVD, headaches, sleep dysfunction, anxiety (refer to problem list and past medical/surgical history for full PMH)  PRECAUTIONS: None  WEIGHT BEARING RESTRICTIONS: No  FALLS:  Has patient fallen in last 6 months? No  LIVING ENVIRONMENT: Lives with: lives alone Lives in: House/apartment Stairs: No Has following equipment at home: Single point cane, Environmental consultant - 4 wheeled, shower chair, and Grab bars  OCCUPATION: Retired  PLOF: Independent and Leisure: read and play card games on her phone, has been doing her prior PT HEP recently qod since pain started up  PATIENT GOALS: "No pain."   OBJECTIVE:   DIAGNOSTIC FINDINGS:  05/25/20 - Lumbar x-ray:  FINDINGS: Decreased osseous mineralization. Stable lumbar vertebral body heights and alignment. Mild disc space narrowing is similar. Small endplate osteophytes are present at L2-L3 and L3-L4. Mild facet hypertrophy. Aortic calcification. IMPRESSION: Mild lumbar spondylosis similar to prior study (2018).  PATIENT SURVEYS:  FOTO Lumbar = 28; predicted = 46  SCREENING FOR RED FLAGS: Bowel or bladder incontinence: No Spinal tumors: No Cauda equina syndrome: No Compression  fracture: No Abdominal aneurysm: No  COGNITION:  Overall cognitive status: Within functional limits for tasks assessed     SENSATION: Light touch: Impaired  numbness in B feet  MUSCLE LENGTH: Hamstrings: mod tight R>L ITB: mod tight R>L Piriformis: mod tight B Hip flexors: NT Quads: NT Heelcord: NT  POSTURE:  rounded shoulders, forward head, decreased lumbar lordosis, and increased thoracic kyphosis  PALPATION: Increased muscle tension and TTP over lumbar paraspinals and R glutes/piriformis and lateral hip  LUMBAR ROM:   Active  AROM  Eval - 09/21/21  Flexion Hands level with knees  Extension 90% limited  Right lateral flexion Hand to mid thigh  Left lateral flexion Hand to mid thigh  Right rotation 80% limited  Left rotation 80% limited    LOWER EXTREMITY ROM:     Mildly limited primarily due to muscle tightness  LOWER EXTREMITY MMT:    MMT Right eval Left eval  Hip flexion 3+ 3+  Hip extension 3- 3-  Hip abduction 4- 3+  Hip adduction 3+ 3+  Hip internal rotation 3+ 3+  Hip external rotation 3+ 3+  Knee flexion 4- 4-  Knee extension 4- 4-  Ankle dorsiflexion 3+ 3+  Ankle plantarflexion 3- 3-  Ankle inversion    Ankle eversion     (Blank rows = not tested)  LUMBAR SPECIAL TESTS:  Straight leg raise test: Positive on Right   TODAY'S TREATMENT:  09/26/21 THERAPEUTIC EXERCISE: to improve flexibility, strength and mobility.  Verbal and tactile cues throughout for technique.  NuStep - L3 x 5 min (UE/LE) Hooklying on wedge + pillow with towel roll under neck: R/L SKTC stretch 2 x 30" - cues to increase hold time as initial attempt was <15 sec R/L KTOS piriformis stretch 2 x 30" B LTR 5 x 10" R/L hamstring stretch with strap 3 x 30" PPT 10 x 5" TrA + slow march 10 x 3" TrA + bent-knee fallout 10 x 3"  TrA + alt knee/leg extension 10 x 3"    09/21/21 MANUAL THERAPY: To promote normalized muscle tension and reduced pain. STM and manual TPR to R glute  medius/minimus  THERAPEUTIC EXERCISE: Instruction in initial HEP (see below) to improve flexibility, strength and mobility.  Verbal and tactile cues throughout for technique. R/L SKTC stretch 2 x 30" R/L KTOS piriformis stretch 2 x 30" B LTR 5 x 10" PPT 10 x 5"    PATIENT EDUCATION:  Education details: HEP update - HS stretch and lumbopelvic strengthening progression Person educated: Patient Education method: Explanation, Demonstration, Verbal cues, and Handouts Education comprehension: verbalized understanding, returned demonstration, verbal cues required, and needs further education   HOME EXERCISE PROGRAM: Access Code: [email or web address] URL: [email or web address] Date: 09/26/2021 Prepared by: Annie Paras  Exercises - Hooklying Single Knee to Chest Stretch  - 2-3 x daily - 7 x weekly - 3 reps - 30 sec hold - Supine Piriformis Stretch with Foot on Ground  - 2-3 x daily - 7 x weekly - 3 reps - 30 sec hold - Supine Lower Trunk Rotation  - 2-3 x daily - 7 x weekly - 5 reps - 10 sec hold - Supine Posterior Pelvic Tilt  - 2-3 x daily - 7 x weekly - 2 sets - 10 reps - 5 sec hold - Supine Hamstring Stretch with Strap  - 2-3 x daily - 7 x weekly - 3 reps - 30 sec hold - Supine March  - 1 x daily - 7 x weekly - 2 sets - 10 reps - 3 sec hold - Bent Knee Fallouts with Alternating Legs  - 1 x daily - 7 x weekly - 2 sets - 10 reps - 3 sec hold - Supine Transversus Abdominis Bracing with Leg Extension  - 1 x daily - 7 x weekly - 2 sets - 10 reps - 3 sec hold   ASSESSMENT:  CLINICAL IMPRESSION: Mona reports no concerns with HEP but upon review cues necessary to increase hold times on stretches as her "30-sec" count was actually <15 sec. HS stretch added as pt reports this helps with the tightness she feels. Progressed lumbopelvic and proximal LE strengthening with basic abdominal bracing exercises with good tolerance - HEP updated accordingly.  OBJECTIVE IMPAIRMENTS: decreased  activity tolerance, decreased endurance, decreased knowledge of condition, decreased mobility, difficulty walking, decreased ROM, decreased strength, hypomobility, increased fascial restrictions, impaired perceived functional ability, increased muscle spasms, impaired flexibility, impaired sensation, improper body mechanics, postural dysfunction, and pain.   ACTIVITY LIMITATIONS: carrying, lifting, bending, sitting, standing, squatting, sleeping, stairs, transfers, bed mobility, bathing, toileting, dressing, reach over head, and locomotion level  PARTICIPATION LIMITATIONS: meal prep, cleaning, laundry, driving, shopping, community activity, and yard work  PERSONAL FACTORS: Age, Fitness, Past/current experiences, Time since onset of injury/illness/exacerbation, and 3+ comorbidities: Extensive PMH including chronic back and neck pain, OA, osteoporosis, DM, HTN, h/o TIA, diabetic peripheral neuropathy, PVD, headaches, sleep dysfunction, anxiety (refer to problem list and past medical/surgical history for full PMH)  are also affecting patient's functional outcome.   REHAB POTENTIAL: Good  CLINICAL DECISION MAKING: Evolving/moderate complexity  EVALUATION COMPLEXITY: Moderate   GOALS: Goals reviewed with patient? Yes  SHORT TERM GOALS: Target date: 10/19/2021   Patient will be independent with initial HEP to improve outcomes and carryover.  Baseline:  Goal status: IN PROGRESS  2.  Patient will report centralization of radicular symptoms.  Baseline:  Goal status: IN PROGRESS  LONG TERM GOALS: Target date: 11/16/2021  Patient will be independent with ongoing/advanced HEP for self-management at home.  Baseline:  Goal status: IN PROGRESS  2.  Patient will report 50-75% improvement in low back pain to improve QOL.  Baseline:  Goal status: IN PROGRESS  3.  Patient to demonstrate ability to achieve and maintain good spinal alignment/posturing and body mechanics needed for daily  activities. Baseline:  Goal status: IN PROGRESS  4.  Patient will demonstrate functional pain free lumbar ROM to perform ADLs.   Baseline:  Goal status: IN PROGRESS  5.  Patient will demonstrate improved B LE strength to >/= 4/5 for improved stability and ease of mobility . Baseline:  Goal status: IN PROGRESS  6.  Patient will report 4 on lumbar FOTO to demonstrate improved functional ability.  Baseline: 28 Goal status: IN PROGRESS   7.  Patient will tolerate 20-30 min of sitting w/o increased pain to improve comfort with eating meals or driving. Baseline:  Goal status: IN PROGRESS   PLAN: PT FREQUENCY: 2x/week  PT DURATION: 8 weeks  PLANNED INTERVENTIONS: Therapeutic exercises, Therapeutic activity, Neuromuscular re-education, Balance training, Gait training, Patient/Family education, Self Care, Joint mobilization, Stair training, Aquatic Therapy, Dry Needling, Electrical stimulation, Spinal mobilization, Cryotherapy, Moist heat, Taping, Traction, Ultrasound, Ionotophoresis '4mg'$ /ml Dexamethasone, Manual therapy, and Re-evaluation.  PLAN FOR NEXT SESSION: gently progress lumbopelvic flexibility and strengthening - update HEP as indicated; posture and body mechanics education; MT +/- DN to address pain and abnormal muscle tension; modalities PRN for pain    Percival Spanish, PT 09/26/2021, 2:59 PM

## 2021-09-27 ENCOUNTER — Ambulatory Visit: Payer: Medicare Other | Admitting: Neurology

## 2021-09-29 ENCOUNTER — Ambulatory Visit: Payer: Medicare Other | Admitting: Physical Therapy

## 2021-09-29 ENCOUNTER — Encounter: Payer: Self-pay | Admitting: Physical Therapy

## 2021-09-29 DIAGNOSIS — M5441 Lumbago with sciatica, right side: Secondary | ICD-10-CM | POA: Diagnosis not present

## 2021-09-29 DIAGNOSIS — M6281 Muscle weakness (generalized): Secondary | ICD-10-CM

## 2021-09-29 DIAGNOSIS — M62830 Muscle spasm of back: Secondary | ICD-10-CM

## 2021-09-29 DIAGNOSIS — R262 Difficulty in walking, not elsewhere classified: Secondary | ICD-10-CM

## 2021-09-29 DIAGNOSIS — G8929 Other chronic pain: Secondary | ICD-10-CM | POA: Diagnosis not present

## 2021-09-29 DIAGNOSIS — R2689 Other abnormalities of gait and mobility: Secondary | ICD-10-CM | POA: Diagnosis not present

## 2021-09-29 DIAGNOSIS — M5459 Other low back pain: Secondary | ICD-10-CM

## 2021-09-29 NOTE — Therapy (Addendum)
OUTPATIENT PHYSICAL THERAPY TREATMENT   Patient Name: [name] MRN: [id number] DOB:09/07/1940, 81 y.o., female Today's Date: 09/29/2021   PT End of Session - 09/29/21 1352     Visit Number 3    Date for PT Re-Evaluation 11/16/21    Authorization Type Medicare    PT Start Time 1352    PT Stop Time 1446    PT Time Calculation (min) 54 min    Activity Tolerance Patient tolerated treatment well    Behavior During [facility] for tasks assessed/performed               Past Medical History:  Diagnosis Date   Abdominal pain in female 03/18/2010   Qualifier: Diagnosis of  By: Carlean Purl MD, Tonna Boehringer E    Anemia 06/08/2014   Anxiety    Arthritis    Spinal Osteoarthritis   Cancer ([place])    Carcinoid tumor of stomach    Cataract    Chest pain    Myoview 12/15 no ischemia.   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 ([place]) 09/05/2006   Qualifier: Diagnosis of  By: Marca Ancona RMA, Lucy     Diabetic peripheral neuropathy ([place]) 10/29/2013   Encounter for preventative adult health care exam with abnormal findings 09/14/2013   Esophageal reflux    Gastric polyp    Fundic Gland   Gastroparesis    Headache(784.0)    Heart murmur    Echocardiogram 2/11: EF 60-65%, mild LAE, grade 1 diastolic dysfunction, aortic valve sclerosis, mean gradient 9 mm of mercury, PASP 34   Hematuria 03/16/2016   Iron deficiency anemia, unspecified    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   Leg swelling    bilateral   Neck pain 04/22/2015   PONV (postoperative nausea and vomiting)    pt states only needs small amount of anesthesia   PSVT (paroxysmal supraventricular tachycardia) ([place])    Pure hypercholesterolemia    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   Renal insufficiency 06/18/2019   Stroke ([place])    tia, 2014   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   Type II or unspecified type diabetes mellitus without mention of complication, not stated as  uncontrolled    Unspecified essential hypertension    Unspecified hereditary and idiopathic peripheral neuropathy 10/29/2013   Past Surgical History:  Procedure Laterality Date   CHOLECYSTECTOMY  1993   COLONOSCOPY  11/11/2010   diverticulosis   DILATATION & CURRETTAGE/HYSTEROSCOPY WITH RESECTOCOPE N/A 02/25/2013   Procedure: Attempted hysteroscopy with uterine perforation;  Surgeon: Jamey Reas de Berton Lan, MD;  Location: [facility];  Service: Gynecology;  Laterality: N/A;   ESOPHAGOGASTRODUODENOSCOPY  08/29/2010; 09/15/2010   Carcinoid tumor less than 1 cm in July 2012 not seen in August 2012 , gastritis, fundic gland polyps   ESOPHAGOGASTRODUODENOSCOPY  05/16/2011   ESOPHAGOGASTRODUODENOSCOPY  06/14/2012   EUS  12/15/2010   Procedure: UPPER ENDOSCOPIC ULTRASOUND (EUS) LINEAR;  Surgeon: Owens Loffler, MD;  Location: [facility];  Service: Endoscopy;  Laterality: N/A;   EYE SURGERY Bilateral    Bi lateral cateracts and bi lateral laser   LAPAROSCOPY N/A 02/25/2013   Procedure: Cystoscopy and laparoscopy with fulguration of uterine serosa;  Surgeon: Jamey Reas de Berton Lan, MD;  Location: [facility];  Service: Gynecology;  Laterality: N/A;   TONSILLECTOMY     Patient Active Problem List   Diagnosis Date Noted   Oral lesion 03/08/2021  Sun-damaged skin 03/08/2021   Thiamine deficiency 11/01/2020   Trigeminal neuralgia 08/21/2020   Pedal edema 08/21/2020   Chronic left-sided back pain 03/28/2020   Nocturia 03/28/2020   Left-sided headache 03/28/2020   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   Renal insufficiency 06/18/2019   Educated about COVID-19 virus infection 05/15/2019   Atrophic vaginitis 01/20/2019   Stenosis of carotid artery 11/12/2018   Anxiety 10/13/2018   Thrush 12/07/2017   Sinusitis 10/11/2017   Headache 07/12/2017   Dizzy spells 11/21/2016   Constipation 11/21/2016   Nonrheumatic aortic valve stenosis 05/23/2016   Aortic atherosclerosis ([facility]) 05/23/2016    Hematuria 03/16/2016   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   Pain of upper abdomen 06/06/2015   Neck pain 04/22/2015   Ear pain 02/28/2015   Abnormal urine 11/21/2014   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   Anemia 06/08/2014   RLS (restless legs syndrome) 11/23/2013   Diabetic peripheral neuropathy ([place]) 10/29/2013   Left-sided thoracic back pain 10/06/2013   Encounter for preventative adult health care exam with abnormal findings 09/14/2013   Iron deficiency anemia    Status post laparoscopy 02/25/2013   Hyponatremia 01/09/2013   GERD (gastroesophageal reflux disease) 01/09/2013   Amaurosis fugax of left eye 10/16/2012   Low back pain 06/03/2012   Vitamin D deficiency 03/11/2012   Bilateral hand pain 10/20/2011   Encounter for long-term (current) use of other medications 10/20/2011   IBS (irritable bowel syndrome) 08/14/2011   TIA (transient ischemic attack) 02/10/2011   Abnormal brain CT 01/19/2011   Allergic rhinitis 10/01/2010   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   FUNDIC GLAND POLYPS OF STOMACH 03/18/2010   Abdominal pain in female 03/18/2010   Pain in joint 09/19/4816   SYSTOLIC MURMUR 56/31/4970   Paroxysmal supraventricular tachycardia ([place]) 01/12/2009   PLANTAR FASCIITIS 06/08/2008   CHEST PAIN 05/18/2008   Gastroparesis 12/18/2007   HYPERCHOLESTEROLEMIA 06/11/2007   Diabetes mellitus type 2 in obese ([place]) 09/05/2006   Essential hypertension 09/05/2006    PCP: [name], MD  REFERRING PROVIDER: Marrian Salvage, FNP  REFERRING DIAG: [phone] (ICD-10-CM) - Chronic right-sided low back pain with right-sided sciatica  RATIONALE FOR EVALUATION AND TREATMENT: Rehabilitation  THERAPY DIAG:  Other low back pain  Muscle spasm of back  Muscle weakness (generalized)  Difficulty in walking, not elsewhere classified  Other abnormalities of gait and mobility  ONSET DATE: acute on chronic exacerbation ~3 weeks  ago  NEXT MD VISIT: 10/13/21 with PCP   SUBJECTIVE:  SUBJECTIVE STATEMENT: Pt reports increased pain yesterday and today after going grocery shopping. She reports she was unable to complete the HS stretch at home as she could not find anything to use as a strap.  PAIN:  Are you having pain? Yes: NPRS scale: 7-8/10 Pain location: R>L low back with R LE radicular pain and burning sensation Pain description: burning, numbness Aggravating factors: prolonged sitting, bending forward, driving, prolonged standing or walking Relieving factors: heat, hot shower, topical pain cream  PERTINENT HISTORY: Extensive PMH including chronic back and neck pain, OA, osteoporosis, DM, HTN, h/o TIA, diabetic peripheral neuropathy, PVD, headaches, sleep dysfunction, anxiety (refer to problem list and past medical/surgical history for full PMH)  PRECAUTIONS: None  WEIGHT BEARING RESTRICTIONS: No  FALLS:  Has patient fallen in last 6 months? No  LIVING ENVIRONMENT: Lives with: lives alone Lives in: House/apartment Stairs: No Has following equipment at home: Single point cane, Environmental consultant - 4 wheeled, shower chair, and Grab bars  OCCUPATION: Retired  PLOF: Independent and Leisure: read and play card games on her phone, has been doing her prior PT HEP recently qod since pain started up  PATIENT GOALS: "No pain."   OBJECTIVE:   DIAGNOSTIC FINDINGS:  05/25/20 - Lumbar x-ray:  FINDINGS: Decreased osseous mineralization. Stable lumbar vertebral body heights and alignment. Mild disc space narrowing is similar. Small endplate osteophytes are present at L2-L3 and L3-L4. Mild facet hypertrophy. Aortic calcification. IMPRESSION: Mild lumbar spondylosis similar to prior study (2018).  PATIENT SURVEYS:  FOTO Lumbar = 28; predicted  = 46  SCREENING FOR RED FLAGS: Bowel or bladder incontinence: No Spinal tumors: No Cauda equina syndrome: No Compression fracture: No Abdominal aneurysm: No  COGNITION:  Overall cognitive status: Within functional limits for tasks assessed     SENSATION: Light touch: Impaired  numbness in B feet  MUSCLE LENGTH: Hamstrings: mod tight R>L ITB: mod tight R>L Piriformis: mod tight B Hip flexors: NT Quads: NT Heelcord: NT  POSTURE:  rounded shoulders, forward head, decreased lumbar lordosis, and increased thoracic kyphosis  PALPATION: Increased muscle tension and TTP over lumbar paraspinals and R glutes/piriformis and lateral hip  LUMBAR ROM:   Active  AROM  Eval - 09/21/21  Flexion Hands level with knees  Extension 90% limited  Right lateral flexion Hand to mid thigh  Left lateral flexion Hand to mid thigh  Right rotation 80% limited  Left rotation 80% limited    LOWER EXTREMITY ROM:     Mildly limited primarily due to muscle tightness  LOWER EXTREMITY MMT:    MMT Right eval Left eval  Hip flexion 3+ 3+  Hip extension 3- 3-  Hip abduction 4- 3+  Hip adduction 3+ 3+  Hip internal rotation 3+ 3+  Hip external rotation 3+ 3+  Knee flexion 4- 4-  Knee extension 4- 4-  Ankle dorsiflexion 3+ 3+  Ankle plantarflexion 3- 3-  Ankle inversion    Ankle eversion     (Blank rows = not tested)  LUMBAR SPECIAL TESTS:  Straight leg raise test: Positive on Right   TODAY'S TREATMENT:  09/29/21 THERAPEUTIC EXERCISE: to improve flexibility, strength and mobility.  Verbal and tactile cues throughout for technique.  NuStep - L3 x 5 min (UE/LE) Seated R/L hip hinge HS stretch w/ and w/o foot on stool 2 x 30" Seated 3-way trunk flexion stretch with hands supported on SPC 2 x 30" Seated L hip adductor side lunge position stretch 2 x 30" Hooklying on wedge + pillow with towel  roll under neck: Mod thomas L hip flexor stretch 2 x 60 sec (PT providing STM to distal hip flexors  and proximal hip adductors) PPT 10 x 5" TrA + bent-knee fallout 10 x 3" TrA + slow march 10 x 3" TrA + alt knee/leg extension 10 x 3"  MANUAL THERAPY: To promote normalized muscle tension, improved flexibility, and reduced pain. STM to distal L hip flexors and proximal hip adductors   09/26/21 THERAPEUTIC EXERCISE: to improve flexibility, strength and mobility.  Verbal and tactile cues throughout for technique.  NuStep - L3 x 5 min (UE/LE) Hooklying on wedge + pillow with towel roll under neck: R/L SKTC stretch 2 x 30" - cues to increase hold time as initial attempt was <15 sec R/L KTOS piriformis stretch 2 x 30" B LTR 5 x 10" R/L hamstring stretch with strap 3 x 30" PPT 10 x 5" TrA + slow march 10 x 3" TrA + bent-knee fallout 10 x 3" TrA + alt knee/leg extension 10 x 3"    09/21/21 MANUAL THERAPY: To promote normalized muscle tension and reduced pain. STM and manual TPR to R glute medius/minimus  THERAPEUTIC EXERCISE: Instruction in initial HEP (see below) to improve flexibility, strength and mobility.  Verbal and tactile cues throughout for technique. R/L SKTC stretch 2 x 30" R/L KTOS piriformis stretch 2 x 30" B LTR 5 x 10" PPT 10 x 5"    PATIENT EDUCATION:  Education details: HEP update - seated lumbar flexion stretches and mod thomas hip flexor stretch, HEP modification - HS stretch changed to sitting, and self-STM techniques to R glutes using tennis ball Person educated: Patient Education method: Explanation, Demonstration, Verbal cues, and Handouts Education comprehension: verbalized understanding, returned demonstration, verbal cues required, and needs further education   HOME EXERCISE PROGRAM: Access Code: [email or web address] URL: [email or web address] Date: 09/29/2021 Prepared by: Annie Paras  Exercises - Hooklying Single Knee to Chest Stretch  - 2-3 x daily - 7 x weekly - 3 reps - 30 sec hold - Supine Piriformis Stretch with Foot on Ground  - 2-3 x  daily - 7 x weekly - 3 reps - 30 sec hold - Supine Lower Trunk Rotation  - 2-3 x daily - 7 x weekly - 5 reps - 10 sec hold - Supine Posterior Pelvic Tilt  - 2-3 x daily - 7 x weekly - 2 sets - 10 reps - 5 sec hold - Supine March  - 1 x daily - 7 x weekly - 2 sets - 10 reps - 3 sec hold - Bent Knee Fallouts with Alternating Legs  - 1 x daily - 7 x weekly - 2 sets - 10 reps - 3 sec hold - Supine Transversus Abdominis Bracing with Leg Extension  - 1 x daily - 7 x weekly - 2 sets - 10 reps - 3 sec hold - Seated Hamstring Stretch  - 2-3 x daily - 7 x weekly - 3 reps - 30 sec hold - Seated Flexion Stretch with Swiss Ball  - 2-3 x daily - 7 x weekly - 3 reps - 30 sec hold - Seated Thoracic Flexion and Rotation with Swiss Ball  - 2-3 x daily - 7 x weekly - 2-3 reps - 30 sec hold - Modified Thomas Stretch (Mirrored)  - 2-3 x daily - 7 x weekly - 3 reps - 30 sec hold   ASSESSMENT:  CLINICAL IMPRESSION: Bridgett reports increased pain today with only potential trigger being her grocery shopping trip. She  reports she was unable to perform the HS stretch at home as she could not find a suitable strap, therefore provided seated alternative but cues necessary to avoid rounding her back. She also noted some increased L hip pain with bent knee fallouts - addressed with hip flexor and adductor stretching as well as MT to same muscles with better tolerance for exercise noted afterwards. We reviewed options for self-STM using tennis ball in supine or against wall to help reduce increased muscle tension.  OBJECTIVE IMPAIRMENTS: decreased activity tolerance, decreased endurance, decreased knowledge of condition, decreased mobility, difficulty walking, decreased ROM, decreased strength, hypomobility, increased fascial restrictions, impaired perceived functional ability, increased muscle spasms, impaired flexibility, impaired sensation, improper body mechanics, postural dysfunction, and pain.   ACTIVITY LIMITATIONS:  carrying, lifting, bending, sitting, standing, squatting, sleeping, stairs, transfers, bed mobility, bathing, toileting, dressing, reach over head, and locomotion level  PARTICIPATION LIMITATIONS: meal prep, cleaning, laundry, driving, shopping, community activity, and yard work  PERSONAL FACTORS: Age, Fitness, Past/current experiences, Time since onset of injury/illness/exacerbation, and 3+ comorbidities: Extensive PMH including chronic back and neck pain, OA, osteoporosis, DM, HTN, h/o TIA, diabetic peripheral neuropathy, PVD, headaches, sleep dysfunction, anxiety (refer to problem list and past medical/surgical history for full PMH)  are also affecting patient's functional outcome.   REHAB POTENTIAL: Good  CLINICAL DECISION MAKING: Evolving/moderate complexity  EVALUATION COMPLEXITY: Moderate   GOALS: Goals reviewed with patient? Yes  SHORT TERM GOALS: Target date: 10/19/2021   Patient will be independent with initial HEP to improve outcomes and carryover.  Baseline:  Goal status: IN PROGRESS  2.  Patient will report centralization of radicular symptoms.  Baseline:  Goal status: IN PROGRESS  LONG TERM GOALS: Target date: 11/16/2021    Patient will be independent with ongoing/advanced HEP for self-management at home.  Baseline:  Goal status: IN PROGRESS  2.  Patient will report 50-75% improvement in low back pain to improve QOL.  Baseline:  Goal status: IN PROGRESS  3.  Patient to demonstrate ability to achieve and maintain good spinal alignment/posturing and body mechanics needed for daily activities. Baseline:  Goal status: IN PROGRESS  4.  Patient will demonstrate functional pain free lumbar ROM to perform ADLs.   Baseline:  Goal status: IN PROGRESS  5.  Patient will demonstrate improved B LE strength to >/= 4/5 for improved stability and ease of mobility . Baseline:  Goal status: IN PROGRESS  6.  Patient will report 63 on lumbar FOTO to demonstrate improved  functional ability.  Baseline: 28 Goal status: IN PROGRESS   7.  Patient will tolerate 20-30 min of sitting w/o increased pain to improve comfort with eating meals or driving. Baseline:  Goal status: IN PROGRESS   PLAN: PT FREQUENCY: 2x/week  PT DURATION: 8 weeks  PLANNED INTERVENTIONS: Therapeutic exercises, Therapeutic activity, Neuromuscular re-education, Balance training, Gait training, Patient/Family education, Self Care, Joint mobilization, Stair training, Aquatic Therapy, Dry Needling, Electrical stimulation, Spinal mobilization, Cryotherapy, Moist heat, Taping, Traction, Ultrasound, Ionotophoresis '4mg'$ /ml Dexamethasone, Manual therapy, and Re-evaluation.  PLAN FOR NEXT SESSION: gently progress lumbopelvic flexibility and strengthening - update HEP as indicated; posture and body mechanics education; MT +/- DN to address pain and abnormal muscle tension; modalities PRN for pain    Percival Spanish, PT 09/29/2021, 6:55 PM

## 2021-09-30 ENCOUNTER — Encounter: Payer: Medicare Other | Admitting: Physical Therapy

## 2021-10-03 ENCOUNTER — Ambulatory Visit: Payer: Medicare Other | Admitting: Physical Therapy

## 2021-10-03 ENCOUNTER — Encounter: Payer: Self-pay | Admitting: Physical Therapy

## 2021-10-03 DIAGNOSIS — M6281 Muscle weakness (generalized): Secondary | ICD-10-CM | POA: Diagnosis not present

## 2021-10-03 DIAGNOSIS — R262 Difficulty in walking, not elsewhere classified: Secondary | ICD-10-CM | POA: Diagnosis not present

## 2021-10-03 DIAGNOSIS — M5459 Other low back pain: Secondary | ICD-10-CM | POA: Diagnosis not present

## 2021-10-03 DIAGNOSIS — G8929 Other chronic pain: Secondary | ICD-10-CM | POA: Diagnosis not present

## 2021-10-03 DIAGNOSIS — R2689 Other abnormalities of gait and mobility: Secondary | ICD-10-CM

## 2021-10-03 DIAGNOSIS — M62830 Muscle spasm of back: Secondary | ICD-10-CM

## 2021-10-03 DIAGNOSIS — M5441 Lumbago with sciatica, right side: Secondary | ICD-10-CM | POA: Diagnosis not present

## 2021-10-03 NOTE — Therapy (Addendum)
OUTPATIENT PHYSICAL THERAPY TREATMENT   Patient Name: [name] MRN: [id number] DOB:10-14-1940, 81 y.o., female Today's Date: 10/03/2021   PT End of Session - 10/03/21 1404     Visit Number 4    Date for PT Re-Evaluation 11/16/21    Authorization Type Medicare    PT Start Time 1404    PT Stop Time 1456    PT Time Calculation (min) 52 min    Activity Tolerance Patient tolerated treatment well    Behavior During Therapy [facility] for tasks assessed/performed                Past Medical History:  Diagnosis Date   Abdominal pain in female 03/18/2010   Qualifier: Diagnosis of  By: Carlean Purl MD, Tonna Boehringer E    Anemia 06/08/2014   Anxiety    Arthritis    Spinal Osteoarthritis   Cancer ([place])    Carcinoid tumor of stomach    Cataract    Chest pain    Myoview 12/15 no ischemia.   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 ([place]) 09/05/2006   Qualifier: Diagnosis of  By: Marca Ancona RMA, Lucy     Diabetic peripheral neuropathy ([place]) 10/29/2013   Encounter for preventative adult health care exam with abnormal findings 09/14/2013   Esophageal reflux    Gastric polyp    Fundic Gland   Gastroparesis    Headache(784.0)    Heart murmur    Echocardiogram 2/11: EF 60-65%, mild LAE, grade 1 diastolic dysfunction, aortic valve sclerosis, mean gradient 9 mm of mercury, PASP 34   Hematuria 03/16/2016   Iron deficiency anemia, unspecified    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   Leg swelling    bilateral   Neck pain 04/22/2015   PONV (postoperative nausea and vomiting)    pt states only needs small amount of anesthesia   PSVT (paroxysmal supraventricular tachycardia) ([place])    Pure hypercholesterolemia    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   Renal insufficiency 06/18/2019   Stroke ([place])    tia, 2014   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   Type II or unspecified type diabetes mellitus without mention of complication, not stated as  uncontrolled    Unspecified essential hypertension    Unspecified hereditary and idiopathic peripheral neuropathy 10/29/2013   Past Surgical History:  Procedure Laterality Date   CHOLECYSTECTOMY  1993   COLONOSCOPY  11/11/2010   diverticulosis   DILATATION & CURRETTAGE/HYSTEROSCOPY WITH RESECTOCOPE N/A 02/25/2013   Procedure: Attempted hysteroscopy with uterine perforation;  Surgeon: Jamey Reas de Berton Lan, MD;  Location: [facility];  Service: Gynecology;  Laterality: N/A;   ESOPHAGOGASTRODUODENOSCOPY  08/29/2010; 09/15/2010   Carcinoid tumor less than 1 cm in July 2012 not seen in August 2012 , gastritis, fundic gland polyps   ESOPHAGOGASTRODUODENOSCOPY  05/16/2011   ESOPHAGOGASTRODUODENOSCOPY  06/14/2012   EUS  12/15/2010   Procedure: UPPER ENDOSCOPIC ULTRASOUND (EUS) LINEAR;  Surgeon: Owens Loffler, MD;  Location: [facility];  Service: Endoscopy;  Laterality: N/A;   EYE SURGERY Bilateral    Bi lateral cateracts and bi lateral laser   LAPAROSCOPY N/A 02/25/2013   Procedure: Cystoscopy and laparoscopy with fulguration of uterine serosa;  Surgeon: Jamey Reas de Berton Lan, MD;  Location: [facility];  Service: Gynecology;  Laterality: N/A;   TONSILLECTOMY     Patient Active Problem List   Diagnosis Date Noted   Oral lesion 03/08/2021  Sun-damaged skin 03/08/2021   Thiamine deficiency 11/01/2020   Trigeminal neuralgia 08/21/2020   Pedal edema 08/21/2020   Chronic left-sided back pain 03/28/2020   Nocturia 03/28/2020   Left-sided headache 03/28/2020   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   Renal insufficiency 06/18/2019   Educated about COVID-19 virus infection 05/15/2019   Atrophic vaginitis 01/20/2019   Stenosis of carotid artery 11/12/2018   Anxiety 10/13/2018   Thrush 12/07/2017   Sinusitis 10/11/2017   Headache 07/12/2017   Dizzy spells 11/21/2016   Constipation 11/21/2016   Nonrheumatic aortic valve stenosis 05/23/2016   Aortic atherosclerosis ([facility]) 05/23/2016    Hematuria 03/16/2016   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   Pain of upper abdomen 06/06/2015   Neck pain 04/22/2015   Ear pain 02/28/2015   Abnormal urine 11/21/2014   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   Anemia 06/08/2014   RLS (restless legs syndrome) 11/23/2013   Diabetic peripheral neuropathy ([place]) 10/29/2013   Left-sided thoracic back pain 10/06/2013   Encounter for preventative adult health care exam with abnormal findings 09/14/2013   Iron deficiency anemia    Status post laparoscopy 02/25/2013   Hyponatremia 01/09/2013   GERD (gastroesophageal reflux disease) 01/09/2013   Amaurosis fugax of left eye 10/16/2012   Low back pain 06/03/2012   Vitamin D deficiency 03/11/2012   Bilateral hand pain 10/20/2011   Encounter for long-term (current) use of other medications 10/20/2011   IBS (irritable bowel syndrome) 08/14/2011   TIA (transient ischemic attack) 02/10/2011   Abnormal brain CT 01/19/2011   Allergic rhinitis 10/01/2010   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   FUNDIC GLAND POLYPS OF STOMACH 03/18/2010   Abdominal pain in female 03/18/2010   Pain in joint 51/88/4166   SYSTOLIC MURMUR 08/06/1599   Paroxysmal supraventricular tachycardia ([place]) 01/12/2009   PLANTAR FASCIITIS 06/08/2008   CHEST PAIN 05/18/2008   Gastroparesis 12/18/2007   HYPERCHOLESTEROLEMIA 06/11/2007   Diabetes mellitus type 2 in obese ([place]) 09/05/2006   Essential hypertension 09/05/2006    PCP: [name], MD  REFERRING PROVIDER: Marrian Salvage, FNP  REFERRING DIAG: [phone] (ICD-10-CM) - Chronic right-sided low back pain with right-sided sciatica  RATIONALE FOR EVALUATION AND TREATMENT: Rehabilitation  THERAPY DIAG:  Other low back pain  Muscle spasm of back  Muscle weakness (generalized)  Difficulty in walking, not elsewhere classified  Other abnormalities of gait and mobility  ONSET DATE: acute on chronic exacerbation ~3 weeks  ago  NEXT MD VISIT: 10/13/21 with PCP   SUBJECTIVE:  SUBJECTIVE STATEMENT: Pt reports she has good days and bad days - today is a bad day.  PAIN:  Are you having pain? Yes: NPRS scale:  7/10 Pain location: R>L low back with R LE radicular pain and burning sensation Pain description: burning, numbness Aggravating factors: prolonged sitting, bending forward, driving, prolonged standing or walking Relieving factors: heat, hot shower, topical pain cream  PERTINENT HISTORY: Extensive PMH including chronic back and neck pain, OA, osteoporosis, DM, HTN, h/o TIA, diabetic peripheral neuropathy, PVD, headaches, sleep dysfunction, anxiety (refer to problem list and past medical/surgical history for full PMH)  PRECAUTIONS: None  WEIGHT BEARING RESTRICTIONS: No  FALLS:  Has patient fallen in last 6 months? No  LIVING ENVIRONMENT: Lives with: lives alone Lives in: House/apartment Stairs: No Has following equipment at home: Single point cane, Environmental consultant - 4 wheeled, shower chair, and Grab bars  OCCUPATION: Retired  PLOF: Independent and Leisure: read and play card games on her phone, has been doing her prior PT HEP recently qod since pain started up  PATIENT GOALS: "No pain."   OBJECTIVE:   DIAGNOSTIC FINDINGS:  05/25/20 - Lumbar x-ray:  FINDINGS: Decreased osseous mineralization. Stable lumbar vertebral body heights and alignment. Mild disc space narrowing is similar. Small endplate osteophytes are present at L2-L3 and L3-L4. Mild facet hypertrophy. Aortic calcification. IMPRESSION: Mild lumbar spondylosis similar to prior study (2018).  PATIENT SURVEYS:  FOTO Lumbar = 28; predicted = 46  SCREENING FOR RED FLAGS: Bowel or bladder incontinence: No Spinal tumors: No Cauda equina syndrome:  No Compression fracture: No Abdominal aneurysm: No  COGNITION:  Overall cognitive status: Within functional limits for tasks assessed     SENSATION: Light touch: Impaired  numbness in B feet  MUSCLE LENGTH: Hamstrings: mod tight R>L ITB: mod tight R>L Piriformis: mod tight B Hip flexors: NT Quads: NT Heelcord: NT  POSTURE:  rounded shoulders, forward head, decreased lumbar lordosis, and increased thoracic kyphosis  PALPATION: Increased muscle tension and TTP over lumbar paraspinals and R glutes/piriformis and lateral hip  LUMBAR ROM:   Active  AROM  Eval - 09/21/21  Flexion Hands level with knees  Extension 90% limited  Right lateral flexion Hand to mid thigh  Left lateral flexion Hand to mid thigh  Right rotation 80% limited  Left rotation 80% limited    LOWER EXTREMITY ROM:     Mildly limited primarily due to muscle tightness  LOWER EXTREMITY MMT:    MMT Right eval Left eval  Hip flexion 3+ 3+  Hip extension 3- 3-  Hip abduction 4- 3+  Hip adduction 3+ 3+  Hip internal rotation 3+ 3+  Hip external rotation 3+ 3+  Knee flexion 4- 4-  Knee extension 4- 4-  Ankle dorsiflexion 3+ 3+  Ankle plantarflexion 3- 3-  Ankle inversion    Ankle eversion     (Blank rows = not tested)  LUMBAR SPECIAL TESTS:  Straight leg raise test: Positive on Right   TODAY'S TREATMENT:  10/03/21 THERAPEUTIC EXERCISE: to improve flexibility, strength and mobility.  Verbal and tactile cues throughout for technique.  NuStep - L4 x 5 min (UE/LE) Seated R/L hip hinge HS stretch 2 x 30" (w/o stool) Seated 3-way trunk flexion stretch with hands supported on SPC 2 x 30"  MANUAL THERAPY: To promote normalized muscle tension, improved flexibility, and reduced pain. STM/DTM and manual TPR to R piriformis and medial glutes IASTM with foam roller to R glutes and piriformis  MODALITIES: Moist heat pack to R glutes &  piriformis x 10 min in hooklying   09/29/21 THERAPEUTIC EXERCISE: to  improve flexibility, strength and mobility.  Verbal and tactile cues throughout for technique.  NuStep - L3 x 5 min (UE/LE) Seated R/L hip hinge HS stretch w/ and w/o foot on stool 2 x 30" Seated 3-way trunk flexion stretch with hands supported on SPC 2 x 30" Seated L hip adductor side lunge position stretch 2 x 30" Hooklying on wedge + pillow with towel roll under neck: Mod thomas L hip flexor stretch 2 x 60 sec (PT providing STM to distal hip flexors and proximal hip adductors) PPT 10 x 5" TrA + bent-knee fallout 10 x 3" TrA + slow march 10 x 3" TrA + alt knee/leg extension 10 x 3"  MANUAL THERAPY: To promote normalized muscle tension, improved flexibility, and reduced pain. STM to distal L hip flexors and proximal hip adductors   09/26/21 THERAPEUTIC EXERCISE: to improve flexibility, strength and mobility.  Verbal and tactile cues throughout for technique.  NuStep - L3 x 5 min (UE/LE) Hooklying on wedge + pillow with towel roll under neck: R/L SKTC stretch 2 x 30" - cues to increase hold time as initial attempt was <15 sec R/L KTOS piriformis stretch 2 x 30" B LTR 5 x 10" R/L hamstring stretch with strap 3 x 30" PPT 10 x 5" TrA + slow march 10 x 3" TrA + bent-knee fallout 10 x 3" TrA + alt knee/leg extension 10 x 3"    PATIENT EDUCATION:  Education details: HEP update - seated lumbar flexion stretches and mod thomas hip flexor stretch, HEP modification - HS stretch changed to sitting, and self-STM techniques to R glutes using tennis ball Person educated: Patient Education method: Explanation, Demonstration, Verbal cues, and Handouts Education comprehension: verbalized understanding, returned demonstration, verbal cues required, and needs further education   HOME EXERCISE PROGRAM: Access Code: [email or web address] URL: [email or web address] Date: 09/29/2021 Prepared by: Annie Paras  Exercises - Hooklying Single Knee to Chest Stretch  - 2-3 x daily - 7 x weekly - 3 reps  - 30 sec hold - Supine Piriformis Stretch with Foot on Ground  - 2-3 x daily - 7 x weekly - 3 reps - 30 sec hold - Supine Lower Trunk Rotation  - 2-3 x daily - 7 x weekly - 5 reps - 10 sec hold - Supine Posterior Pelvic Tilt  - 2-3 x daily - 7 x weekly - 2 sets - 10 reps - 5 sec hold - Supine March  - 1 x daily - 7 x weekly - 2 sets - 10 reps - 3 sec hold - Bent Knee Fallouts with Alternating Legs  - 1 x daily - 7 x weekly - 2 sets - 10 reps - 3 sec hold - Supine Transversus Abdominis Bracing with Leg Extension  - 1 x daily - 7 x weekly - 2 sets - 10 reps - 3 sec hold - Seated Hamstring Stretch  - 2-3 x daily - 7 x weekly - 3 reps - 30 sec hold - Seated Flexion Stretch with Swiss Ball  - 2-3 x daily - 7 x weekly - 3 reps - 30 sec hold - Seated Thoracic Flexion and Rotation with Swiss Ball  - 2-3 x daily - 7 x weekly - 2-3 reps - 30 sec hold - Modified Thomas Stretch (Mirrored)  - 2-3 x daily - 7 x weekly - 3 reps - 30 sec hold   ASSESSMENT:  CLINICAL IMPRESSION: Landon reports good  days and bad days with today being a bad day - increased pain along sacroiliac border and into R buttocks. She reports one of her HEP exercises seems to be exacerbating her pain but upon review what she was demonstrating was not a HEP exercise. HEP reviewed and clarified with the misunderstanding seeming to be related to the seated HS - pt verbalizing better understanding following review.  Remainder of session focusing on MT to address her pain and abnormal muscle tension in her R glutes and piriformis followed by moist heat application to promote further muscle relaxation and pain relief. Pt reporting reduction in pain by end of session.  OBJECTIVE IMPAIRMENTS: decreased activity tolerance, decreased endurance, decreased knowledge of condition, decreased mobility, difficulty walking, decreased ROM, decreased strength, hypomobility, increased fascial restrictions, impaired perceived functional ability, increased  muscle spasms, impaired flexibility, impaired sensation, improper body mechanics, postural dysfunction, and pain.   ACTIVITY LIMITATIONS: carrying, lifting, bending, sitting, standing, squatting, sleeping, stairs, transfers, bed mobility, bathing, toileting, dressing, reach over head, and locomotion level  PARTICIPATION LIMITATIONS: meal prep, cleaning, laundry, driving, shopping, community activity, and yard work  PERSONAL FACTORS: Age, Fitness, Past/current experiences, Time since onset of injury/illness/exacerbation, and 3+ comorbidities: Extensive PMH including chronic back and neck pain, OA, osteoporosis, DM, HTN, h/o TIA, diabetic peripheral neuropathy, PVD, headaches, sleep dysfunction, anxiety (refer to problem list and past medical/surgical history for full PMH)  are also affecting patient's functional outcome.   REHAB POTENTIAL: Good  CLINICAL DECISION MAKING: Evolving/moderate complexity  EVALUATION COMPLEXITY: Moderate   GOALS: Goals reviewed with patient? Yes  SHORT TERM GOALS: Target date: 10/19/2021   Patient will be independent with initial HEP to improve outcomes and carryover.  Baseline:  Goal status: IN PROGRESS  2.  Patient will report centralization of radicular symptoms.  Baseline:  Goal status: IN PROGRESS  LONG TERM GOALS: Target date: 11/16/2021    Patient will be independent with ongoing/advanced HEP for self-management at home.  Baseline:  Goal status: IN PROGRESS  2.  Patient will report 50-75% improvement in low back pain to improve QOL.  Baseline:  Goal status: IN PROGRESS  3.  Patient to demonstrate ability to achieve and maintain good spinal alignment/posturing and body mechanics needed for daily activities. Baseline:  Goal status: IN PROGRESS  4.  Patient will demonstrate functional pain free lumbar ROM to perform ADLs.   Baseline:  Goal status: IN PROGRESS  5.  Patient will demonstrate improved B LE strength to >/= 4/5 for improved  stability and ease of mobility . Baseline:  Goal status: IN PROGRESS  6.  Patient will report 52 on lumbar FOTO to demonstrate improved functional ability.  Baseline: 28 Goal status: IN PROGRESS   7.  Patient will tolerate 20-30 min of sitting w/o increased pain to improve comfort with eating meals or driving. Baseline:  Goal status: IN PROGRESS   PLAN: PT FREQUENCY: 2x/week  PT DURATION: 8 weeks  PLANNED INTERVENTIONS: Therapeutic exercises, Therapeutic activity, Neuromuscular re-education, Balance training, Gait training, Patient/Family education, Self Care, Joint mobilization, Stair training, Aquatic Therapy, Dry Needling, Electrical stimulation, Spinal mobilization, Cryotherapy, Moist heat, Taping, Traction, Ultrasound, Ionotophoresis '4mg'$ /ml Dexamethasone, Manual therapy, and Re-evaluation.  PLAN FOR NEXT SESSION: gently progress lumbopelvic flexibility and strengthening - update HEP as indicated; posture and body mechanics education; MT +/- DN to address pain and abnormal muscle tension; modalities PRN for pain    Percival Spanish, PT 10/03/2021, 7:34 PM

## 2021-10-06 ENCOUNTER — Ambulatory Visit: Payer: Medicare Other | Admitting: Physical Therapy

## 2021-10-06 ENCOUNTER — Ambulatory Visit: Payer: Medicare Other

## 2021-10-06 ENCOUNTER — Encounter: Payer: Medicare Other | Admitting: Physical Therapy

## 2021-10-06 ENCOUNTER — Encounter: Payer: Self-pay | Admitting: Physical Therapy

## 2021-10-06 DIAGNOSIS — M6281 Muscle weakness (generalized): Secondary | ICD-10-CM

## 2021-10-06 DIAGNOSIS — I471 Supraventricular tachycardia: Secondary | ICD-10-CM | POA: Diagnosis not present

## 2021-10-06 DIAGNOSIS — I10 Essential (primary) hypertension: Secondary | ICD-10-CM | POA: Diagnosis not present

## 2021-10-06 DIAGNOSIS — M5459 Other low back pain: Secondary | ICD-10-CM | POA: Diagnosis not present

## 2021-10-06 DIAGNOSIS — R262 Difficulty in walking, not elsewhere classified: Secondary | ICD-10-CM | POA: Diagnosis not present

## 2021-10-06 DIAGNOSIS — E785 Hyperlipidemia, unspecified: Secondary | ICD-10-CM

## 2021-10-06 DIAGNOSIS — R2689 Other abnormalities of gait and mobility: Secondary | ICD-10-CM

## 2021-10-06 DIAGNOSIS — G8929 Other chronic pain: Secondary | ICD-10-CM | POA: Diagnosis not present

## 2021-10-06 DIAGNOSIS — E1140 Type 2 diabetes mellitus with diabetic neuropathy, unspecified: Secondary | ICD-10-CM

## 2021-10-06 DIAGNOSIS — M5441 Lumbago with sciatica, right side: Secondary | ICD-10-CM | POA: Diagnosis not present

## 2021-10-06 DIAGNOSIS — Z7984 Long term (current) use of oral hypoglycemic drugs: Secondary | ICD-10-CM

## 2021-10-06 DIAGNOSIS — M62830 Muscle spasm of back: Secondary | ICD-10-CM | POA: Diagnosis not present

## 2021-10-06 NOTE — Therapy (Addendum)
OUTPATIENT PHYSICAL THERAPY TREATMENT   Patient Name: [name] MRN: [id number] DOB:04/18/1940, 81 y.o., female Today's Date: 10/06/2021   PT End of Session - 10/06/21 1402     Visit Number 5    Date for PT Re-Evaluation 11/16/21    Authorization Type Medicare    PT Start Time 1402    PT Stop Time 1450    PT Time Calculation (min) 48 min    Activity Tolerance Patient tolerated treatment well    Behavior During Therapy [facility] for tasks assessed/performed                 Past Medical History:  Diagnosis Date   Abdominal pain in female 03/18/2010   Qualifier: Diagnosis of  By: Carlean Purl MD, Tonna Boehringer E    Anemia 06/08/2014   Anxiety    Arthritis    Spinal Osteoarthritis   Cancer ([place])    Carcinoid tumor of stomach    Cataract    Chest pain    Myoview 12/15 no ischemia.   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 ([name]) 09/05/2006   Qualifier: Diagnosis of  By: Marca Ancona RMA, Lucy     Diabetic peripheral neuropathy ([place]) 10/29/2013   Encounter for preventative adult health care exam with abnormal findings 09/14/2013   Esophageal reflux    Gastric polyp    Fundic Gland   Gastroparesis    Headache(784.0)    Heart murmur    Echocardiogram 2/11: EF 60-65%, mild LAE, grade 1 diastolic dysfunction, aortic valve sclerosis, mean gradient 9 mm of mercury, PASP 34   Hematuria 03/16/2016   Iron deficiency anemia, unspecified    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   Leg swelling    bilateral   Neck pain 04/22/2015   PONV (postoperative nausea and vomiting)    pt states only needs small amount of anesthesia   PSVT (paroxysmal supraventricular tachycardia) ([place])    Pure hypercholesterolemia    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   Renal insufficiency 06/18/2019   Stroke ([place])    tia, 2014   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   Type II or unspecified type diabetes mellitus without mention of complication, not stated as  uncontrolled    Unspecified essential hypertension    Unspecified hereditary and idiopathic peripheral neuropathy 10/29/2013   Past Surgical History:  Procedure Laterality Date   CHOLECYSTECTOMY  1993   COLONOSCOPY  11/11/2010   diverticulosis   DILATATION & CURRETTAGE/HYSTEROSCOPY WITH RESECTOCOPE N/A 02/25/2013   Procedure: Attempted hysteroscopy with uterine perforation;  Surgeon: Jamey Reas de Berton Lan, MD;  Location: [facility];  Service: Gynecology;  Laterality: N/A;   ESOPHAGOGASTRODUODENOSCOPY  08/29/2010; 09/15/2010   Carcinoid tumor less than 1 cm in July 2012 not seen in August 2012 , gastritis, fundic gland polyps   ESOPHAGOGASTRODUODENOSCOPY  05/16/2011   ESOPHAGOGASTRODUODENOSCOPY  06/14/2012   EUS  12/15/2010   Procedure: UPPER ENDOSCOPIC ULTRASOUND (EUS) LINEAR;  Surgeon: Owens Loffler, MD;  Location: [facility];  Service: Endoscopy;  Laterality: N/A;   EYE SURGERY Bilateral    Bi lateral cateracts and bi lateral laser   LAPAROSCOPY N/A 02/25/2013   Procedure: Cystoscopy and laparoscopy with fulguration of uterine serosa;  Surgeon: Jamey Reas de Berton Lan, MD;  Location: [facility];  Service: Gynecology;  Laterality: N/A;   TONSILLECTOMY     Patient Active Problem List   Diagnosis Date Noted   Oral lesion  03/08/2021   Sun-damaged skin 03/08/2021   Thiamine deficiency 11/01/2020   Trigeminal neuralgia 08/21/2020   Pedal edema 08/21/2020   Chronic left-sided back pain 03/28/2020   Nocturia 03/28/2020   Left-sided headache 03/28/2020   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   Renal insufficiency 06/18/2019   Educated about COVID-19 virus infection 05/15/2019   Atrophic vaginitis 01/20/2019   Stenosis of carotid artery 11/12/2018   Anxiety 10/13/2018   Thrush 12/07/2017   Sinusitis 10/11/2017   Headache 07/12/2017   Dizzy spells 11/21/2016   Constipation 11/21/2016   Nonrheumatic aortic valve stenosis 05/23/2016   Aortic atherosclerosis ([facility]) 05/23/2016    Hematuria 03/16/2016   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   Pain of upper abdomen 06/06/2015   Neck pain 04/22/2015   Ear pain 02/28/2015   Abnormal urine 11/21/2014   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   Anemia 06/08/2014   RLS (restless legs syndrome) 11/23/2013   Diabetic peripheral neuropathy ([place]) 10/29/2013   Left-sided thoracic back pain 10/06/2013   Encounter for preventative adult health care exam with abnormal findings 09/14/2013   Iron deficiency anemia    Status post laparoscopy 02/25/2013   Hyponatremia 01/09/2013   GERD (gastroesophageal reflux disease) 01/09/2013   Amaurosis fugax of left eye 10/16/2012   Low back pain 06/03/2012   Vitamin D deficiency 03/11/2012   Bilateral hand pain 10/20/2011   Encounter for long-term (current) use of other medications 10/20/2011   IBS (irritable bowel syndrome) 08/14/2011   TIA (transient ischemic attack) 02/10/2011   Abnormal brain CT 01/19/2011   Allergic rhinitis 10/01/2010   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   FUNDIC GLAND POLYPS OF STOMACH 03/18/2010   Abdominal pain in female 03/18/2010   Pain in joint 44/31/5400   SYSTOLIC MURMUR 86/76/1950   Paroxysmal supraventricular tachycardia ([place]) 01/12/2009   PLANTAR FASCIITIS 06/08/2008   CHEST PAIN 05/18/2008   Gastroparesis 12/18/2007   HYPERCHOLESTEROLEMIA 06/11/2007   Diabetes mellitus type 2 in obese ([place]) 09/05/2006   Essential hypertension 09/05/2006    PCP: [name], MD  REFERRING PROVIDER: Marrian Salvage, FNP  REFERRING DIAG: [phone] (ICD-10-CM) - Chronic right-sided low back pain with right-sided sciatica  RATIONALE FOR EVALUATION AND TREATMENT: Rehabilitation  THERAPY DIAG:  Other low back pain  Muscle spasm of back  Muscle weakness (generalized)  Difficulty in walking, not elsewhere classified  Other abnormalities of gait and mobility  ONSET DATE: acute on chronic exacerbation ~3 weeks  ago  NEXT MD VISIT: 10/13/21 with PCP   SUBJECTIVE:  SUBJECTIVE STATEMENT: Pt reports she is still a little sore from the MT last visit but states her back pain is less today although still having the radicular pain and numbness.  PAIN:  Are you having pain? Yes: NPRS scale:  5/10 Pain location: R>L low back with R LE radicular pain and burning sensation Pain description: burning, numbness Aggravating factors: prolonged sitting, bending forward, driving, prolonged standing or walking Relieving factors: heat, hot shower, topical pain cream  PERTINENT HISTORY: Extensive PMH including chronic back and neck pain, OA, osteoporosis, DM, HTN, h/o TIA, diabetic peripheral neuropathy, PVD, headaches, sleep dysfunction, anxiety (refer to problem list and past medical/surgical history for full PMH)  PRECAUTIONS: None  WEIGHT BEARING RESTRICTIONS: No  FALLS:  Has patient fallen in last 6 months? No  LIVING ENVIRONMENT: Lives with: lives alone Lives in: House/apartment Stairs: No Has following equipment at home: Single point cane, Environmental consultant - 4 wheeled, shower chair, and Grab bars  OCCUPATION: Retired  PLOF: Independent and Leisure: read and play card games on her phone, has been doing her prior PT HEP recently qod since pain started up  PATIENT GOALS: "No pain."   OBJECTIVE:   DIAGNOSTIC FINDINGS:  05/25/20 - Lumbar x-ray:  FINDINGS: Decreased osseous mineralization. Stable lumbar vertebral body heights and alignment. Mild disc space narrowing is similar. Small endplate osteophytes are present at L2-L3 and L3-L4. Mild facet hypertrophy. Aortic calcification. IMPRESSION: Mild lumbar spondylosis similar to prior study (2018).  PATIENT SURVEYS:  FOTO Lumbar = 28; predicted = 46  SCREENING FOR RED  FLAGS: Bowel or bladder incontinence: No Spinal tumors: No Cauda equina syndrome: No Compression fracture: No Abdominal aneurysm: No  COGNITION:  Overall cognitive status: Within functional limits for tasks assessed     SENSATION: Light touch: Impaired  numbness in B feet  MUSCLE LENGTH: Hamstrings: mod tight R>L ITB: mod tight R>L Piriformis: mod tight B Hip flexors: NT Quads: NT Heelcord: NT  POSTURE:  rounded shoulders, forward head, decreased lumbar lordosis, and increased thoracic kyphosis  PALPATION: Increased muscle tension and TTP over lumbar paraspinals and R glutes/piriformis and lateral hip  LUMBAR ROM:   Active  AROM  Eval - 09/21/21  Flexion Hands level with knees  Extension 90% limited  Right lateral flexion Hand to mid thigh  Left lateral flexion Hand to mid thigh  Right rotation 80% limited  Left rotation 80% limited    LOWER EXTREMITY ROM:     Mildly limited primarily due to muscle tightness  LOWER EXTREMITY MMT:    MMT Right eval Left eval  Hip flexion 3+ 3+  Hip extension 3- 3-  Hip abduction 4- 3+  Hip adduction 3+ 3+  Hip internal rotation 3+ 3+  Hip external rotation 3+ 3+  Knee flexion 4- 4-  Knee extension 4- 4-  Ankle dorsiflexion 3+ 3+  Ankle plantarflexion 3- 3-  Ankle inversion    Ankle eversion     (Blank rows = not tested)  LUMBAR SPECIAL TESTS:  Straight leg raise test: Positive on Right   TODAY'S TREATMENT:  10/06/21 THERAPEUTIC EXERCISE: to improve flexibility, strength and mobility.  Verbal and tactile cues throughout for technique.  Rec Bike - L1 x 5 min Lumbar extension in standing with back of hip/buttocks against cabinets 10 x 5" Hooklying on wedge + pillow with towel roll under neck: TrA + RTB bent-knee fallout 10 x 3" TrA + RTB slow march 10 x 3" Bridge + RTB hip ABD isometric 10 x 3" Side-lying R/L  RTB clam 10 x 3" Seated R/L hip hinge HS stretch 2 x 30" (w/o stool) - clarified again for HEP at pt  request    10/03/21 THERAPEUTIC EXERCISE: to improve flexibility, strength and mobility.  Verbal and tactile cues throughout for technique.  NuStep - L4 x 5 min (UE/LE) Seated 3-way trunk flexion stretch with hands supported on SPC 2 x 30"  MANUAL THERAPY: To promote normalized muscle tension, improved flexibility, and reduced pain. STM/DTM and manual TPR to R piriformis and medial glutes IASTM with foam roller to R glutes and piriformis  MODALITIES: Moist heat pack to R glutes & piriformis x 10 min in hooklying   09/29/21 THERAPEUTIC EXERCISE: to improve flexibility, strength and mobility.  Verbal and tactile cues throughout for technique.  NuStep - L3 x 5 min (UE/LE) Seated R/L hip hinge HS stretch w/ and w/o foot on stool 2 x 30" Seated 3-way trunk flexion stretch with hands supported on SPC 2 x 30" Seated L hip adductor side lunge position stretch 2 x 30" Hooklying on wedge + pillow with towel roll under neck: Mod thomas L hip flexor stretch 2 x 60 sec (PT providing STM to distal hip flexors and proximal hip adductors) PPT 10 x 5" TrA + bent-knee fallout 10 x 3" TrA + slow march 10 x 3" TrA + alt knee/leg extension 10 x 3"  MANUAL THERAPY: To promote normalized muscle tension, improved flexibility, and reduced pain. STM to distal L hip flexors and proximal hip adductors   PATIENT EDUCATION:  Education details: HEP update - extension in standing (POE extension demonstrated but not attempted) and HEP modification - RTB added to bent-knee fallouts and marching Person educated: Patient Education method: Explanation, Demonstration, Verbal cues, and Handouts Education comprehension: verbalized understanding, returned demonstration, verbal cues required, and needs further education   HOME EXERCISE PROGRAM: Access Code: [email or web address] URL: [email or web address] Date: 10/06/2021 Prepared by: Annie Paras  Exercises - Hooklying Single Knee to Chest Stretch  - 2-3 x  daily - 7 x weekly - 3 reps - 30 sec hold - Supine Piriformis Stretch with Foot on Ground  - 2-3 x daily - 7 x weekly - 3 reps - 30 sec hold - Supine Lower Trunk Rotation  - 2-3 x daily - 7 x weekly - 5 reps - 10 sec hold - Supine Posterior Pelvic Tilt  - 2-3 x daily - 7 x weekly - 2 sets - 10 reps - 5 sec hold - Supine March  - 1 x daily - 7 x weekly - 2 sets - 10 reps - 3 sec hold - Bent Knee Fallouts with Alternating Legs  - 1 x daily - 7 x weekly - 2 sets - 10 reps - 3 sec hold - Supine Transversus Abdominis Bracing with Leg Extension  - 1 x daily - 7 x weekly - 2 sets - 10 reps - 3 sec hold - Seated Hamstring Stretch  - 2-3 x daily - 7 x weekly - 3 reps - 30 sec hold - Seated Flexion Stretch with Swiss Ball  - 2-3 x daily - 7 x weekly - 3 reps - 30 sec hold - Seated Thoracic Flexion and Rotation with Swiss Ball  - 2-3 x daily - 7 x weekly - 2-3 reps - 30 sec hold - Modified Thomas Stretch (Mirrored)  - 2-3 x daily - 3 x weekly - 3 reps - 30 sec hold - Hooklying Single Leg Bent Knee Fallouts with Resistance  - 1  x daily - 3 x weekly - 2 sets - 10 reps - 3 sec hold - Supine March with Resistance Band  - 1 x daily - 3 x weekly - 2 sets - 10 reps - 2-3 sec hold hold - Bridge with Resistance  - 1 x daily - 3 x weekly - 2 sets - 10 reps - 5 sec hold - Standing Lumbar Extension with Counter  - 1 x daily - 7 x weekly - 2 sets - 10 reps - 3 sec hold   ASSESSMENT:  CLINICAL IMPRESSION: Luddie reports some lingering muscle soreness following the MT last visit but reports her back pain is less today. Progressed strengthening adding RTB to prior core stabilization exercises and adding RTB hip ABD isometric bridges. Introduced extension based exercises to promote centralization of radicular pain - pt unsure of change with extension in standing but reluctant to try prone-based extension as she is not usually comfortable on her stomach. Demonstrated POE and suggested she might try this on her bed at home.  HEP updated at pt request. Will plan for review of posture and body mechanics awareness in upcoming visits. [place] will continue to benefit from skilled PT to improve flexibility, strength and postural awareness.  OBJECTIVE IMPAIRMENTS: decreased activity tolerance, decreased endurance, decreased knowledge of condition, decreased mobility, difficulty walking, decreased ROM, decreased strength, hypomobility, increased fascial restrictions, impaired perceived functional ability, increased muscle spasms, impaired flexibility, impaired sensation, improper body mechanics, postural dysfunction, and pain.   ACTIVITY LIMITATIONS: carrying, lifting, bending, sitting, standing, squatting, sleeping, stairs, transfers, bed mobility, bathing, toileting, dressing, reach over head, and locomotion level  PARTICIPATION LIMITATIONS: meal prep, cleaning, laundry, driving, shopping, community activity, and yard work  PERSONAL FACTORS: Age, Fitness, Past/current experiences, Time since onset of injury/illness/exacerbation, and 3+ comorbidities: Extensive PMH including chronic back and neck pain, OA, osteoporosis, DM, HTN, h/o TIA, diabetic peripheral neuropathy, PVD, headaches, sleep dysfunction, anxiety (refer to problem list and past medical/surgical history for full PMH)  are also affecting patient's functional outcome.   REHAB POTENTIAL: Good  CLINICAL DECISION MAKING: Evolving/moderate complexity  EVALUATION COMPLEXITY: Moderate   GOALS: Goals reviewed with patient? Yes  SHORT TERM GOALS: Target date: 10/19/2021   Patient will be independent with initial HEP to improve outcomes and carryover.  Baseline:  Goal status: IN PROGRESS  2.  Patient will report centralization of radicular symptoms.  Baseline:  Goal status: IN PROGRESS  LONG TERM GOALS: Target date: 11/16/2021    Patient will be independent with ongoing/advanced HEP for self-management at home.  Baseline:  Goal status: IN PROGRESS  2.   Patient will report 50-75% improvement in low back pain to improve QOL.  Baseline:  Goal status: IN PROGRESS  3.  Patient to demonstrate ability to achieve and maintain good spinal alignment/posturing and body mechanics needed for daily activities. Baseline:  Goal status: IN PROGRESS  4.  Patient will demonstrate functional pain free lumbar ROM to perform ADLs.   Baseline:  Goal status: IN PROGRESS  5.  Patient will demonstrate improved B LE strength to >/= 4/5 for improved stability and ease of mobility . Baseline:  Goal status: IN PROGRESS  6.  Patient will report 47 on lumbar FOTO to demonstrate improved functional ability.  Baseline: 28 Goal status: IN PROGRESS   7.  Patient will tolerate 20-30 min of sitting w/o increased pain to improve comfort with eating meals or driving. Baseline:  Goal status: IN PROGRESS   PLAN: PT FREQUENCY: 2x/week  PT  DURATION: 8 weeks  PLANNED INTERVENTIONS: Therapeutic exercises, Therapeutic activity, Neuromuscular re-education, Balance training, Gait training, Patient/Family education, Self Care, Joint mobilization, Stair training, Aquatic Therapy, Dry Needling, Electrical stimulation, Spinal mobilization, Cryotherapy, Moist heat, Taping, Traction, Ultrasound, Ionotophoresis '4mg'$ /ml Dexamethasone, Manual therapy, and Re-evaluation.  PLAN FOR NEXT SESSION: posture and body mechanics education; gently progress lumbopelvic flexibility and strengthening - update HEP as indicated; MT +/- DN to address pain and abnormal muscle tension; modalities PRN for pain    Percival Spanish, PT 10/06/2021, 3:05 PM

## 2021-10-07 ENCOUNTER — Encounter: Payer: Medicare Other | Admitting: Physical Therapy

## 2021-10-11 ENCOUNTER — Other Ambulatory Visit (INDEPENDENT_AMBULATORY_CARE_PROVIDER_SITE_OTHER): Payer: Medicare Other

## 2021-10-11 ENCOUNTER — Encounter: Payer: Medicare Other | Admitting: Physical Therapy

## 2021-10-11 ENCOUNTER — Other Ambulatory Visit: Payer: Self-pay | Admitting: Family Medicine

## 2021-10-11 DIAGNOSIS — I10 Essential (primary) hypertension: Secondary | ICD-10-CM | POA: Diagnosis not present

## 2021-10-11 DIAGNOSIS — E7800 Pure hypercholesterolemia, unspecified: Secondary | ICD-10-CM | POA: Diagnosis not present

## 2021-10-11 DIAGNOSIS — E669 Obesity, unspecified: Secondary | ICD-10-CM

## 2021-10-11 DIAGNOSIS — E1169 Type 2 diabetes mellitus with other specified complication: Secondary | ICD-10-CM

## 2021-10-11 DIAGNOSIS — E559 Vitamin D deficiency, unspecified: Secondary | ICD-10-CM | POA: Diagnosis not present

## 2021-10-11 DIAGNOSIS — D538 Other specified nutritional anemias: Secondary | ICD-10-CM

## 2021-10-11 LAB — COMPREHENSIVE METABOLIC PANEL
ALT: 18 U/L (ref 0–35)
AST: 19 U/L (ref 0–37)
Albumin: 4.2 g/dL (ref 3.5–5.2)
Alkaline Phosphatase: 58 U/L (ref 39–117)
BUN: 21 mg/dL (ref 6–23)
CO2: 29 mEq/L (ref 19–32)
Calcium: 9.9 mg/dL (ref 8.4–10.5)
Chloride: 99 mEq/L (ref 96–112)
Creatinine, Ser: 0.85 mg/dL (ref 0.40–1.20)
GFR: 64.65 mL/min (ref >60.00)
Glucose, Bld: 125 mg/dL — ABNORMAL HIGH (ref 70–99)
Potassium: 4.4 mEq/L (ref 3.5–5.1)
Sodium: 137 mEq/L (ref 135–145)
Total Bilirubin: 0.7 mg/dL (ref 0.2–1.2)
Total Protein: 6.9 g/dL (ref 6.0–8.3)

## 2021-10-11 LAB — LIPID PANEL
Cholesterol: 185 mg/dL (ref 0–200)
HDL: 66 mg/dL (ref >39.00)
LDL Cholesterol: 95 mg/dL (ref 0–99)
NonHDL: 119.26
Total CHOL/HDL Ratio: 3
Triglycerides: 122 mg/dL (ref 0.0–149.0)
VLDL: 24.4 mg/dL (ref 0.0–40.0)

## 2021-10-11 LAB — CBC
HCT: 35.9 % — ABNORMAL LOW (ref 36.0–46.0)
Hemoglobin: 12.2 g/dL (ref 12.0–15.0)
MCHC: 34 g/dL (ref 30.0–36.0)
MCV: 85.9 fl (ref 78.0–100.0)
Platelets: 219 10*3/uL (ref 150.0–400.0)
RBC: 4.18 Mil/uL (ref 3.87–5.11)
RDW: 12.8 % (ref 11.5–15.5)
WBC: 7.8 10*3/uL (ref 4.0–10.5)

## 2021-10-11 LAB — HEMOGLOBIN A1C: Hgb A1c MFr Bld: 6.7 % — ABNORMAL HIGH (ref 4.6–6.5)

## 2021-10-11 LAB — VITAMIN D 25 HYDROXY (VIT D DEFICIENCY, FRACTURES): VITD: 38.15 ng/mL (ref 30.00–100.00)

## 2021-10-11 LAB — TSH: TSH: 2.52 u[IU]/mL (ref 0.35–5.50)

## 2021-10-12 NOTE — Assessment & Plan Note (Signed)
Supplement and monitor 

## 2021-10-12 NOTE — Assessment & Plan Note (Addendum)
Well controlled, no changes to meds. Encouraged heart healthy diet such as the DASH diet and exercise as tolerated. Given flu shot today

## 2021-10-12 NOTE — Assessment & Plan Note (Signed)
Hydrate and monitor 

## 2021-10-12 NOTE — Assessment & Plan Note (Signed)
hgba1c acceptable, minimize simple carbs. Increase exercise as tolerated. Continue current meds 

## 2021-10-12 NOTE — Assessment & Plan Note (Signed)
Encourage heart healthy diet such as MIND or DASH diet, increase exercise, avoid trans fats, simple carbohydrates and processed foods, consider a krill or fish or flaxseed oil cap daily.  Tolerating Rosuvastatin 

## 2021-10-13 ENCOUNTER — Encounter: Payer: Self-pay | Admitting: Family Medicine

## 2021-10-13 ENCOUNTER — Other Ambulatory Visit (HOSPITAL_BASED_OUTPATIENT_CLINIC_OR_DEPARTMENT_OTHER): Payer: Self-pay

## 2021-10-13 ENCOUNTER — Ambulatory Visit: Payer: Medicare Other | Attending: Family | Admitting: Physical Therapy

## 2021-10-13 ENCOUNTER — Encounter: Payer: Self-pay | Admitting: Physical Therapy

## 2021-10-13 ENCOUNTER — Ambulatory Visit (INDEPENDENT_AMBULATORY_CARE_PROVIDER_SITE_OTHER): Payer: Medicare Other | Admitting: Family Medicine

## 2021-10-13 VITALS — BP 118/62 | HR 67 | Temp 98.0°F | Resp 16 | Ht 61.0 in | Wt 169.2 lb

## 2021-10-13 DIAGNOSIS — E7800 Pure hypercholesterolemia, unspecified: Secondary | ICD-10-CM

## 2021-10-13 DIAGNOSIS — N289 Disorder of kidney and ureter, unspecified: Secondary | ICD-10-CM

## 2021-10-13 DIAGNOSIS — R293 Abnormal posture: Secondary | ICD-10-CM | POA: Diagnosis not present

## 2021-10-13 DIAGNOSIS — R2689 Other abnormalities of gait and mobility: Secondary | ICD-10-CM | POA: Insufficient documentation

## 2021-10-13 DIAGNOSIS — R252 Cramp and spasm: Secondary | ICD-10-CM | POA: Diagnosis not present

## 2021-10-13 DIAGNOSIS — M6281 Muscle weakness (generalized): Secondary | ICD-10-CM | POA: Insufficient documentation

## 2021-10-13 DIAGNOSIS — M5459 Other low back pain: Secondary | ICD-10-CM | POA: Insufficient documentation

## 2021-10-13 DIAGNOSIS — M542 Cervicalgia: Secondary | ICD-10-CM | POA: Insufficient documentation

## 2021-10-13 DIAGNOSIS — Z23 Encounter for immunization: Secondary | ICD-10-CM | POA: Diagnosis not present

## 2021-10-13 DIAGNOSIS — M62830 Muscle spasm of back: Secondary | ICD-10-CM | POA: Diagnosis not present

## 2021-10-13 DIAGNOSIS — M5412 Radiculopathy, cervical region: Secondary | ICD-10-CM | POA: Insufficient documentation

## 2021-10-13 DIAGNOSIS — E559 Vitamin D deficiency, unspecified: Secondary | ICD-10-CM

## 2021-10-13 DIAGNOSIS — E519 Thiamine deficiency, unspecified: Secondary | ICD-10-CM

## 2021-10-13 DIAGNOSIS — E1169 Type 2 diabetes mellitus with other specified complication: Secondary | ICD-10-CM | POA: Diagnosis not present

## 2021-10-13 DIAGNOSIS — E669 Obesity, unspecified: Secondary | ICD-10-CM

## 2021-10-13 DIAGNOSIS — N952 Postmenopausal atrophic vaginitis: Secondary | ICD-10-CM

## 2021-10-13 DIAGNOSIS — R262 Difficulty in walking, not elsewhere classified: Secondary | ICD-10-CM | POA: Diagnosis not present

## 2021-10-13 DIAGNOSIS — I10 Essential (primary) hypertension: Secondary | ICD-10-CM | POA: Diagnosis not present

## 2021-10-13 MED ORDER — BETAMETHASONE VALERATE 0.1 % EX OINT
1.0000 | TOPICAL_OINTMENT | CUTANEOUS | 1 refills | Status: DC
  Filled 2021-10-13: qty 45, 90d supply, fill #0

## 2021-10-13 NOTE — Progress Notes (Signed)
Subjective:   By signing my name below, I, Kellie Simmering, attest that this documentation has been prepared under the direction and in the presence of [name], MD 10/13/2021.     [name], female    DOB: 11-02-1940, 81 y.o.   MRN: [id number]  No chief complaint on file.  HPI Patient is in today for an office visit.  Back pain: She complains of back pain and has been doing physical therapy to manage this. She is requesting physical therapy to manage her neck pain.   Blood pressure: Her blood pressure is within normal range today. She is currently taking Losartan 50 mg twice daily.  BP Readings from Last 3 Encounters:  10/13/21 118/62  09/06/21 124/60  08/04/21 140/70   Blood sugar: She states that she he has been eating a lot of fruit and is inquiring about her blood sugar.  Lab Results  Component Value Date   HGBA1C 6.7 (H) 10/11/2021   Anemia: She is inquiring about the severity of her anemia.   Cough: She complains of a dry cough that has been bothering her. She denies having a recent fever or shortness of breath.   Gastroenterology: She reports that she has been experiencing stomach issues and is taking Nexium 40 mg and Reglan 5 mg.  Immunizations: She has been informed about receiving COVID-19, high-dose Flu, and RSV immunizations.   Neurology: She reports that the bottom of her feet feel wet and she wants to be evaluated for peripheral neuropathy. She states that the left foot is worse than the right and she occasionally experiences numbness.   Supplements: She is currently taking calcium, vitamin D and vitamin E supplements. She in interested in taking a multivitamin with minerals.   Refills: She is requesting a refill on her Betamethasone ointment.  Past Medical History:  Diagnosis Date   Abdominal pain in female 03/18/2010   Qualifier: Diagnosis of  By: Carlean Purl MD, Dimas Millin    Anemia 06/08/2014   Anxiety    Arthritis    Spinal  Osteoarthritis   Cancer ([place])    Carcinoid tumor of stomach    Cataract    Chest pain    Myoview 12/15 no ischemia.   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 ([place]) 09/05/2006   Qualifier: Diagnosis of  By: Marca Ancona RMA, Lucy     Diabetic peripheral neuropathy ([place]) 10/29/2013   Encounter for preventative adult health care exam with abnormal findings 09/14/2013   Esophageal reflux    Gastric polyp    Fundic Gland   Gastroparesis    Headache(784.0)    Heart murmur    Echocardiogram 2/11: EF 60-65%, mild LAE, grade 1 diastolic dysfunction, aortic valve sclerosis, mean gradient 9 mm of mercury, PASP 34   Hematuria 03/16/2016   Iron deficiency anemia, unspecified    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   Leg swelling    bilateral   Neck pain 04/22/2015   PONV (postoperative nausea and vomiting)    pt states only needs small amount of anesthesia   PSVT (paroxysmal supraventricular tachycardia) ([place])    Pure hypercholesterolemia    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   Renal insufficiency 06/18/2019   Stroke ([place])    tia, 2014   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   Type II or unspecified type diabetes mellitus without mention of complication, not stated as uncontrolled    Unspecified  essential hypertension    Unspecified hereditary and idiopathic peripheral neuropathy 10/29/2013   Past Surgical History:  Procedure Laterality Date   CHOLECYSTECTOMY  1993   COLONOSCOPY  11/11/2010   diverticulosis   DILATATION & CURRETTAGE/HYSTEROSCOPY WITH RESECTOCOPE N/A 02/25/2013   Procedure: Attempted hysteroscopy with uterine perforation;  Surgeon: Jamey Reas de Berton Lan, MD;  Location: [facility];  Service: Gynecology;  Laterality: N/A;   ESOPHAGOGASTRODUODENOSCOPY  08/29/2010; 09/15/2010   Carcinoid tumor less than 1 cm in July 2012 not seen in August 2012 , gastritis, fundic gland polyps   ESOPHAGOGASTRODUODENOSCOPY  05/16/2011    ESOPHAGOGASTRODUODENOSCOPY  06/14/2012   EUS  12/15/2010   Procedure: UPPER ENDOSCOPIC ULTRASOUND (EUS) LINEAR;  Surgeon: Owens Loffler, MD;  Location: [facility];  Service: Endoscopy;  Laterality: N/A;   EYE SURGERY Bilateral    Bi lateral cateracts and bi lateral laser   LAPAROSCOPY N/A 02/25/2013   Procedure: Cystoscopy and laparoscopy with fulguration of uterine serosa;  Surgeon: Jamey Reas de Berton Lan, MD;  Location: [facility];  Service: Gynecology;  Laterality: N/A;   TONSILLECTOMY     Family History  Problem Relation Age of Onset   Diabetes Mother    Stroke Father        deceased age 23   Heart disease Sister        deceased MI age 67   Diabetes Sister    Heart disease Sister    Hypertension Sister    Hyperlipidemia Sister    Diabetes Sister    Heart disease Sister    Hypertension Sister    Hyperlipidemia Sister    Heart disease Brother        deceased MI age 19   Intellectual disability Brother    Diabetes Brother    Heart disease Brother    Hypertension Brother    Hyperlipidemia Brother    Diabetes Maternal Grandmother    Hypertension Paternal Grandmother    Colon cancer Neg Hx    Esophageal cancer Neg Hx    Stomach cancer Neg Hx    Rectal cancer Neg Hx    Social History   Socioeconomic History   Marital status: Widowed    Spouse name: Not on file   Number of children: 0   Years of education: college   Highest education level: Not on file  Occupational History   Occupation: retired  Tobacco Use   Smoking status: Never   Smokeless tobacco: Never   Tobacco comments:    Never used tobacco  Vaping Use   Vaping Use: Never used  Substance and Sexual Activity   Alcohol use: No    Alcohol/week: 0.0 standard drinks of alcohol   Drug use: No   Sexual activity: Never    Partners: Male    Birth control/protection: Post-menopausal    Comment: lives alone, no dietary restrictions except avoid fresh veg, fruit, whole grains  Other Topics Concern    Not on file  Social History Narrative   Patient was married ([name]) - widow   Patient does not have any children.   Patient is right-handed.   Patient has a BA degree.   One caffeine drink daily    Social Determinants of Health   Financial Resource Strain: Low Risk  (03/17/2021)   Overall Financial Resource Strain (CARDIA)    Difficulty of Paying Living Expenses: Not very hard  Food Insecurity: No Food Insecurity (11/09/2020)   Hunger Vital Sign    Worried About Running Out  of Food in the Last Year: Never true    [place] in the Last Year: Never true  Transportation Needs: No Transportation Needs (11/09/2020)   PRAPARE - Hydrologist (Medical): No    Lack of Transportation (Non-Medical): No  Physical Activity: Insufficiently Active (03/17/2021)   Exercise Vital Sign    Days of Exercise per Week: 5 days    Minutes of Exercise per Session: 10 min  Stress: No Stress Concern Present (06/07/2021)   [place]    Feeling of Stress : Not at all  Social Connections: Moderately Isolated (06/07/2021)   Social Connection and Isolation Panel [NHANES]    Frequency of Communication with Friends and Family: More than three times a week    Frequency of Social Gatherings with Friends and Family: Once a week    Attends Religious Services: More than 4 times per year    Active Member of [employer] or Organizations: No    Attends Archivist Meetings: Never    Marital Status: Widowed  Intimate Partner Violence: Not At Risk (06/07/2021)   Humiliation, Afraid, Rape, and Kick questionnaire    Fear of Current or Ex-Partner: No    Emotionally Abused: No    Physically Abused: No    Sexually Abused: No   Outpatient Medications Prior to Visit  Medication Sig Dispense Refill   acetaminophen (TYLENOL) 325 MG tablet Take 2 tablets (650 mg total) by mouth every 6 (six) hours as needed for up to 30 doses for mild  pain or moderate pain. 30 tablet 0   amLODipine (NORVASC) 5 MG tablet TAKE 1 TABLET BY MOUTH  DAILY 90 tablet 3   aspirin EC 81 MG tablet Take 81 mg by mouth daily. Swallow whole.     betamethasone valerate ointment (VALISONE) 0.1 % Apply qod to affected area, small amount (Patient not taking: Reported on 09/21/2021) 45 g 1   Blood Glucose Monitoring Suppl (ONE TOUCH ULTRA 2) w/Device KIT USE TO CHECK BLOOD SUGAR AS DIRECTED 1 kit 0   cholecalciferol (VITAMIN D) 1000 UNITS tablet Take 1,000 Units by mouth daily.     COVID-19 At Home Antigen Test [facility] COVID-19 HOME TEST) KIT Use as directed (Patient not taking: Reported on 09/21/2021) 2 kit 0   diclofenac Sodium (PENNSAID) 2 % SOLN Apply 2 Pump (40 mg total) topically 2 (two) times daily as needed. 112 g 0   dicyclomine (BENTYL) 10 MG capsule TAKE 1 CAPSULE BY MOUTH 4 TIMES  DAILY AS NEEDED 240 capsule 0   furosemide (LASIX) 20 MG tablet TAKE 1 TABLET BY MOUTH  DAILY AS NEEDED 90 tablet 3   Lancets (ONETOUCH ULTRASOFT) lancets USE AS DIRECTED 3 TIMES  DAILY 300 each 3   losartan (COZAAR) 50 MG tablet TAKE 1 TABLET BY MOUTH TWICE  DAILY 180 tablet 3   metFORMIN (GLUCOPHAGE-XR) 500 MG 24 hr tablet TAKE 1 TABLET BY MOUTH IN  THE MORNING AND AT BEDTIME 180 tablet 3   methocarbamol (ROBAXIN) 500 MG tablet Take 1 tablet (500 mg total) by mouth at bedtime as needed for muscle spasms. 20 tablet 0   metoCLOPramide (REGLAN) 5 MG tablet TAKE 1 TABLET BY MOUTH TWICE  DAILY AS NEEDED 180 tablet 0   metoprolol tartrate (LOPRESSOR) 50 MG tablet TAKE 1 AND 1/2 TABLETS BY  MOUTH TWICE DAILY 240 tablet 3   NEXIUM 40 MG capsule Take 1 capsule (40 mg total)  by mouth daily before breakfast. 90 capsule 3   ondansetron (ZOFRAN) 4 MG tablet Take 4 mg by mouth every 4 (four) hours as needed. For nausea     ONETOUCH ULTRA test strip CHECK BLOOD SUGAR 3 TIMES  DAILY OR AS NEEDED 300 strip 3   potassium chloride (KLOR-CON M) 10 MEQ tablet TAKE 1 TABLET BY MOUTH  DAILY 90  tablet 3   rosuvastatin (CRESTOR) 20 MG tablet TAKE 1 TABLET BY MOUTH  DAILY 90 tablet 3   thiamine (VITAMIN B-1) 100 MG tablet Take 100 mg by mouth every other day.     predniSONE (DELTASONE) 20 MG tablet Take 1 tablet (20 mg total) by mouth daily with breakfast. (Patient not taking: Reported on 09/21/2021) 5 tablet 0   No facility-administered medications prior to visit.   Allergies  Allergen Reactions   Tramadol Other (See Comments)    Dizziness    Review of Systems  Constitutional:  Negative for fever.  Respiratory:  Negative for shortness of breath.        Objective:    Physical Exam Constitutional:      General: She is not in acute distress.    Appearance: Normal appearance. She is not ill-appearing.  HENT:     Head: Normocephalic and atraumatic.     Right Ear: External ear normal.     Left Ear: External ear normal.     Mouth/Throat:     Mouth: Mucous membranes are moist.     Pharynx: Oropharynx is clear.  Eyes:     Extraocular Movements: Extraocular movements intact.     Pupils: Pupils are equal, round, and reactive to light.  Cardiovascular:     Rate and Rhythm: Normal rate and regular rhythm.     Pulses: Normal pulses.     Heart sounds: Normal heart sounds. No murmur heard.    No gallop.  Pulmonary:     Effort: Pulmonary effort is normal. No respiratory distress.     Breath sounds: Normal breath sounds. No wheezing or rales.  Abdominal:     General: Bowel sounds are normal.  Skin:    General: Skin is warm and dry.  Neurological:     Mental Status: She is alert and oriented to person, place, and time.  Psychiatric:        Mood and Affect: Mood normal.        Behavior: Behavior normal.        Judgment: Judgment normal.    LMP 02/07/1992  Wt Readings from Last 3 Encounters:  09/06/21 169 lb (76.7 kg)  08/04/21 167 lb (75.8 kg)  07/05/21 168 lb (76.2 kg)   Diabetic Foot Exam - Simple   No data filed    Lab Results  Component Value Date   WBC 7.8  10/11/2021   HGB 12.2 10/11/2021   HCT 35.9 (L) 10/11/2021   PLT 219.0 10/11/2021   GLUCOSE 125 (H) 10/11/2021   CHOL 185 10/11/2021   TRIG 122.0 10/11/2021   HDL 66.00 10/11/2021   LDLDIRECT 95.0 03/25/2020   LDLCALC 95 10/11/2021   ALT 18 10/11/2021   AST 19 10/11/2021   NA 137 10/11/2021   K 4.4 10/11/2021   CL 99 10/11/2021   CREATININE 0.85 10/11/2021   BUN 21 10/11/2021   CO2 29 10/11/2021   TSH 2.52 10/11/2021   INR 0.88 01/16/2011   HGBA1C 6.7 (H) 10/11/2021   MICROALBUR 2.8 (H) 07/05/2021   Lab Results  Component Value Date  TSH 2.52 10/11/2021   Lab Results  Component Value Date   WBC 7.8 10/11/2021   HGB 12.2 10/11/2021   HCT 35.9 (L) 10/11/2021   MCV 85.9 10/11/2021   PLT 219.0 10/11/2021   Lab Results  Component Value Date   NA 137 10/11/2021   K 4.4 10/11/2021   CHLORIDE 101 07/08/2015   CO2 29 10/11/2021   GLUCOSE 125 (H) 10/11/2021   BUN 21 10/11/2021   CREATININE 0.85 10/11/2021   BILITOT 0.7 10/11/2021   ALKPHOS 58 10/11/2021   AST 19 10/11/2021   ALT 18 10/11/2021   PROT 6.9 10/11/2021   ALBUMIN 4.2 10/11/2021   CALCIUM 9.9 10/11/2021   ANIONGAP 10 05/04/2021   EGFR 75 (L) 07/08/2015   GFR 64.65 10/11/2021   Lab Results  Component Value Date   CHOL 185 10/11/2021   Lab Results  Component Value Date   HDL 66.00 10/11/2021   Lab Results  Component Value Date   LDLCALC 95 10/11/2021   Lab Results  Component Value Date   TRIG 122.0 10/11/2021   Lab Results  Component Value Date   CHOLHDL 3 10/11/2021   Lab Results  Component Value Date   HGBA1C 6.7 (H) 10/11/2021      Assessment & Plan:   Problem List Items Addressed This Visit       Cardiovascular and Mediastinum   Essential hypertension (Chronic)     Endocrine   Diabetes mellitus type 2 in obese ([facility]) (Chronic)     Genitourinary   Renal insufficiency     Other   HYPERCHOLESTEROLEMIA   Vitamin D deficiency   Thiamine deficiency   No orders of the  defined types were placed in this encounter.  I, Kellie Simmering, personally preformed the services described in this documentation.  All medical record entries made by the scribe were at my direction and in my presence.  I have reviewed the chart and discharge instructions (if applicable) and agree that the record reflects my personal performance and is accurate and complete. 10/13/2021  I,Mohammed Iqbal,acting as a scribe for Penni Homans, MD.,have documented all relevant documentation on the behalf of Penni Homans, MD,as directed by  Penni Homans, MD while in the presence of Penni Homans, MD.  Kellie Simmering

## 2021-10-13 NOTE — Therapy (Signed)
OUTPATIENT PHYSICAL THERAPY TREATMENT   Patient Name: [name] MRN: [id number] DOB:1940/03/04, 81 y.o., female Today's Date: 10/13/2021   PT End of Session - 10/13/21 1322     Visit Number 6    Date for PT Re-Evaluation 11/16/21    Authorization Type Medicare    PT Start Time 1522    PT Stop Time 1617    PT Time Calculation (min) 55 min    Activity Tolerance Patient tolerated treatment well    Behavior During Therapy [facility] for tasks assessed/performed                  Past Medical History:  Diagnosis Date   Abdominal pain in female 03/18/2010   Qualifier: Diagnosis of  By: Carlean Purl MD, Tonna Boehringer E    Anemia 06/08/2014   Anxiety    Arthritis    Spinal Osteoarthritis   Cancer ([place])    Carcinoid tumor of stomach    Cataract    Chest pain    Myoview 12/15 no ischemia.   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 ([place]) 09/05/2006   Qualifier: Diagnosis of  By: Marca Ancona RMA, Lucy     Diabetic peripheral neuropathy ([place]) 10/29/2013   Encounter for preventative adult health care exam with abnormal findings 09/14/2013   Esophageal reflux    Gastric polyp    Fundic Gland   Gastroparesis    Headache(784.0)    Heart murmur    Echocardiogram 2/11: EF 60-65%, mild LAE, grade 1 diastolic dysfunction, aortic valve sclerosis, mean gradient 9 mm of mercury, PASP 34   Hematuria 03/16/2016   Iron deficiency anemia, unspecified    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   Leg swelling    bilateral   Neck pain 04/22/2015   PONV (postoperative nausea and vomiting)    pt states only needs small amount of anesthesia   PSVT (paroxysmal supraventricular tachycardia) ([place])    Pure hypercholesterolemia    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   Renal insufficiency 06/18/2019   Stroke ([place])    tia, 2014   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   Type II or unspecified type diabetes mellitus without mention of complication, not stated as  uncontrolled    Unspecified essential hypertension    Unspecified hereditary and idiopathic peripheral neuropathy 10/29/2013   Past Surgical History:  Procedure Laterality Date   CHOLECYSTECTOMY  1993   COLONOSCOPY  11/11/2010   diverticulosis   DILATATION & CURRETTAGE/HYSTEROSCOPY WITH RESECTOCOPE N/A 02/25/2013   Procedure: Attempted hysteroscopy with uterine perforation;  Surgeon: Jamey Reas de Berton Lan, MD;  Location: [facility];  Service: Gynecology;  Laterality: N/A;   ESOPHAGOGASTRODUODENOSCOPY  08/29/2010; 09/15/2010   Carcinoid tumor less than 1 cm in July 2012 not seen in August 2012 , gastritis, fundic gland polyps   ESOPHAGOGASTRODUODENOSCOPY  05/16/2011   ESOPHAGOGASTRODUODENOSCOPY  06/14/2012   EUS  12/15/2010   Procedure: UPPER ENDOSCOPIC ULTRASOUND (EUS) LINEAR;  Surgeon: Owens Loffler, MD;  Location: [facility];  Service: Endoscopy;  Laterality: N/A;   EYE SURGERY Bilateral    Bi lateral cateracts and bi lateral laser   LAPAROSCOPY N/A 02/25/2013   Procedure: Cystoscopy and laparoscopy with fulguration of uterine serosa;  Surgeon: Jamey Reas de Berton Lan, MD;  Location: [facility];  Service: Gynecology;  Laterality: N/A;   TONSILLECTOMY     Patient Active Problem List   Diagnosis Date Noted   Oral  lesion 03/08/2021   Sun-damaged skin 03/08/2021   Thiamine deficiency 11/01/2020   Trigeminal neuralgia 08/21/2020   Pedal edema 08/21/2020   Chronic left-sided back pain 03/28/2020   Nocturia 03/28/2020   Left-sided headache 03/28/2020   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   Renal insufficiency 06/18/2019   Educated about COVID-19 virus infection 05/15/2019   Atrophic vaginitis 01/20/2019   Stenosis of carotid artery 11/12/2018   Anxiety 10/13/2018   Thrush 12/07/2017   Sinusitis 10/11/2017   Headache 07/12/2017   Dizzy spells 11/21/2016   Constipation 11/21/2016   Nonrheumatic aortic valve stenosis 05/23/2016   Aortic atherosclerosis ([facility]) 05/23/2016    Hematuria 03/16/2016   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   Pain of upper abdomen 06/06/2015   Neck pain 04/22/2015   Ear pain 02/28/2015   Abnormal urine 11/21/2014   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   Anemia 06/08/2014   RLS (restless legs syndrome) 11/23/2013   Diabetic peripheral neuropathy ([place]) 10/29/2013   Left-sided thoracic back pain 10/06/2013   Encounter for preventative adult health care exam with abnormal findings 09/14/2013   Iron deficiency anemia    Status post laparoscopy 02/25/2013   Hyponatremia 01/09/2013   GERD (gastroesophageal reflux disease) 01/09/2013   Amaurosis fugax of left eye 10/16/2012   Low back pain 06/03/2012   Vitamin D deficiency 03/11/2012   Bilateral hand pain 10/20/2011   Encounter for long-term (current) use of other medications 10/20/2011   IBS (irritable bowel syndrome) 08/14/2011   TIA (transient ischemic attack) 02/10/2011   Abnormal brain CT 01/19/2011   Allergic rhinitis 10/01/2010   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   FUNDIC GLAND POLYPS OF STOMACH 03/18/2010   Abdominal pain in female 03/18/2010   Pain in joint 28/78/6767   SYSTOLIC MURMUR 20/94/7096   Paroxysmal supraventricular tachycardia ([place]) 01/12/2009   PLANTAR FASCIITIS 06/08/2008   CHEST PAIN 05/18/2008   Gastroparesis 12/18/2007   HYPERCHOLESTEROLEMIA 06/11/2007   Diabetes mellitus type 2 in obese ([place]) 09/05/2006   Essential hypertension 09/05/2006    PCP: [name], MD  REFERRING PROVIDER: Marrian Salvage, FNP  REFERRING DIAG: [phone] (ICD-10-CM) - Chronic right-sided low back pain with right-sided sciatica  RATIONALE FOR EVALUATION AND TREATMENT: Rehabilitation  THERAPY DIAG:  Other low back pain  Muscle spasm of back  Muscle weakness (generalized)  Difficulty in walking, not elsewhere classified  Other abnormalities of gait and mobility  ONSET DATE: acute on chronic exacerbation ~3 weeks  ago  NEXT MD VISIT: 10/13/21 with PCP   SUBJECTIVE:  SUBJECTIVE STATEMENT: Pt comes to PT straight from MD office, stating MD has entered a referral for PT to address her neck pain as well as her LBP.  PAIN:  Are you having pain? Yes: NPRS scale:  4/10 Pain location: R>L low back with R LE radicular pain and burning sensation Pain description: burning, numbness Aggravating factors: prolonged sitting, bending forward, driving, prolonged standing or walking Relieving factors: heat, hot shower, topical pain cream  Are you having pain? Yes: NPRS scale:  8/10 Pain location: Neck - L upper & R lower Pain description: sharp Aggravating factors: sleeping on L side Relieving factors: chin tuck exercises   PERTINENT HISTORY: Extensive PMH including chronic back and neck pain, OA, osteoporosis, DM, HTN, h/o TIA, diabetic peripheral neuropathy, PVD, headaches, sleep dysfunction, anxiety (refer to problem list and past medical/surgical history for full PMH)  PRECAUTIONS: None  WEIGHT BEARING RESTRICTIONS: No  FALLS:  Has patient fallen in last 6 months? No  LIVING ENVIRONMENT: Lives with: lives alone Lives in: House/apartment Stairs: No Has following equipment at home: Single point cane, Environmental consultant - 4 wheeled, shower chair, and Grab bars  OCCUPATION: Retired  PLOF: Independent and Leisure: read and play card games on her phone, has been doing her prior PT HEP recently qod since pain started up  PATIENT GOALS: "No pain."   OBJECTIVE:   DIAGNOSTIC FINDINGS:  05/25/20 - Lumbar x-ray:  FINDINGS: Decreased osseous mineralization. Stable lumbar vertebral body heights and alignment. Mild disc space narrowing is similar. Small endplate osteophytes are present at L2-L3 and L3-L4. Mild facet hypertrophy. Aortic  calcification. IMPRESSION: Mild lumbar spondylosis similar to prior study (2018).  PATIENT SURVEYS:  FOTO Lumbar = 28; predicted = 46  SCREENING FOR RED FLAGS: Bowel or bladder incontinence: No Spinal tumors: No Cauda equina syndrome: No Compression fracture: No Abdominal aneurysm: No  COGNITION:  Overall cognitive status: Within functional limits for tasks assessed     SENSATION: Light touch: Impaired  numbness in B feet  MUSCLE LENGTH: Hamstrings: mod tight R>L ITB: mod tight R>L Piriformis: mod tight B Hip flexors: NT Quads: NT Heelcord: NT  POSTURE:  rounded shoulders, forward head, decreased lumbar lordosis, and increased thoracic kyphosis  PALPATION: Increased muscle tension and TTP over lumbar paraspinals and R glutes/piriformis and lateral hip  LUMBAR ROM:   Active  AROM  Eval - 09/21/21  Flexion Hands level with knees  Extension 90% limited  Right lateral flexion Hand to mid thigh  Left lateral flexion Hand to mid thigh  Right rotation 80% limited  Left rotation 80% limited    LOWER EXTREMITY ROM:     Mildly limited primarily due to muscle tightness  LOWER EXTREMITY MMT:    MMT Right eval Left eval  Hip flexion 3+ 3+  Hip extension 3- 3-  Hip abduction 4- 3+  Hip adduction 3+ 3+  Hip internal rotation 3+ 3+  Hip external rotation 3+ 3+  Knee flexion 4- 4-  Knee extension 4- 4-  Ankle dorsiflexion 3+ 3+  Ankle plantarflexion 3- 3-  Ankle inversion    Ankle eversion     (Blank rows = not tested)  LUMBAR SPECIAL TESTS:  Straight leg raise test: Positive on Right   TODAY'S TREATMENT:  10/13/21 THERAPEUTIC EXERCISE: to improve flexibility, strength and mobility.  Verbal and tactile cues throughout for technique.  NuStep - L5 x 5 min (UE/LE) Thoracolumbar extension over back of chair 10 x 3"   SELF CARE:  Provided education in proper posture  and body mechanics for typical daily positioning and household chores to minimize strain on low  back and neck - PT demonstrating positions and movement patterns with pt performing return demonstration while PT helping to determine modifications as indicated.   10/06/21 THERAPEUTIC EXERCISE: to improve flexibility, strength and mobility.  Verbal and tactile cues throughout for technique.  Rec Bike - L1 x 5 min Lumbar extension in standing with back of hip/buttocks against cabinets 10 x 5" Hooklying on wedge + pillow with towel roll under neck: TrA + RTB bent-knee fallout 10 x 3" TrA + RTB slow march 10 x 3" Bridge + RTB hip ABD isometric 10 x 3" Side-lying R/L RTB clam 10 x 3" Seated R/L hip hinge HS stretch 2 x 30" (w/o stool) - clarified again for HEP at pt request    10/03/21 THERAPEUTIC EXERCISE: to improve flexibility, strength and mobility.  Verbal and tactile cues throughout for technique.  NuStep - L4 x 5 min (UE/LE) Seated 3-way trunk flexion stretch with hands supported on SPC 2 x 30"  MANUAL THERAPY: To promote normalized muscle tension, improved flexibility, and reduced pain. STM/DTM and manual TPR to R piriformis and medial glutes IASTM with foam roller to R glutes and piriformis  MODALITIES: Moist heat pack to R glutes & piriformis x 10 min in hooklying   PATIENT EDUCATION:  Education details: posture and body mechanics for typical daily postioning, mobility and household tasks Person educated: Patient Education method: Explanation, Demonstration, Tactile cues, Verbal cues, and Handouts Education comprehension: verbalized understanding, returned demonstration, verbal cues required, tactile cues required, and needs further education   HOME EXERCISE PROGRAM: Access Code: [email or web address] URL: [email or web address] Date: 10/13/2021 Prepared by: Annie Paras  Exercises - Hooklying Single Knee to Chest Stretch  - 2-3 x daily - 7 x weekly - 3 reps - 30 sec hold - Supine Piriformis Stretch with Foot on Ground  - 2-3 x daily - 7 x weekly - 3 reps - 30 sec  hold - Supine Lower Trunk Rotation  - 2-3 x daily - 7 x weekly - 5 reps - 10 sec hold - Supine Posterior Pelvic Tilt  - 2-3 x daily - 7 x weekly - 2 sets - 10 reps - 5 sec hold - Supine March  - 1 x daily - 7 x weekly - 2 sets - 10 reps - 3 sec hold - Bent Knee Fallouts with Alternating Legs  - 1 x daily - 7 x weekly - 2 sets - 10 reps - 3 sec hold - Supine Transversus Abdominis Bracing with Leg Extension  - 1 x daily - 7 x weekly - 2 sets - 10 reps - 3 sec hold - Seated Hamstring Stretch  - 2-3 x daily - 7 x weekly - 3 reps - 30 sec hold - Seated Flexion Stretch with Swiss Ball  - 2-3 x daily - 7 x weekly - 3 reps - 30 sec hold - Seated Thoracic Flexion and Rotation with Swiss Ball  - 2-3 x daily - 7 x weekly - 2-3 reps - 30 sec hold - Modified Thomas Stretch (Mirrored)  - 2-3 x daily - 3 x weekly - 3 reps - 30 sec hold - Hooklying Single Leg Bent Knee Fallouts with Resistance  - 1 x daily - 3 x weekly - 2 sets - 10 reps - 3 sec hold - Supine March with Resistance Band  - 1 x daily - 3 x weekly - 2 sets - 10 reps -  2-3 sec hold hold - Bridge with Resistance  - 1 x daily - 3 x weekly - 2 sets - 10 reps - 5 sec hold - Standing Lumbar Extension with Counter  - 1 x daily - 7 x weekly - 2 sets - 10 reps - 3 sec hold  Patient Education - Posture and Body Mechanics   ASSESSMENT:  CLINICAL IMPRESSION: Kalee arrives to PT today with new referral for PT to address neck pain in addition to LBP. We discussed timing of formal evaluation for neck, offering pt option to continue focus on her back and then switch focus to neck when she feels her back is doing better vs scheduling a formal re-eval for her neck next visit - pt opting to focus on her back for a few more visits as she feels that it has been improving, then switch focus. Majority of session focusing on extensive review, demonstration and return demonstration of proper posture and body mechanics for typical daily positioning and household chores  to minimize strain on low back and neck, emphasizing sleeping position, sitting posture in standard chair and recliner as well as phone use. Essie recognizing she has tendency to slouch when sitting at that several of her seating options at home do not offer much in the way of support, therefore discussed adjustments and/or modifications that can be made to promote better alignment with pt verbalizing understanding.  OBJECTIVE IMPAIRMENTS: decreased activity tolerance, decreased endurance, decreased knowledge of condition, decreased mobility, difficulty walking, decreased ROM, decreased strength, hypomobility, increased fascial restrictions, impaired perceived functional ability, increased muscle spasms, impaired flexibility, impaired sensation, improper body mechanics, postural dysfunction, and pain.   ACTIVITY LIMITATIONS: carrying, lifting, bending, sitting, standing, squatting, sleeping, stairs, transfers, bed mobility, bathing, toileting, dressing, reach over head, and locomotion level  PARTICIPATION LIMITATIONS: meal prep, cleaning, laundry, driving, shopping, community activity, and yard work  PERSONAL FACTORS: Age, Fitness, Past/current experiences, Time since onset of injury/illness/exacerbation, and 3+ comorbidities: Extensive PMH including chronic back and neck pain, OA, osteoporosis, DM, HTN, h/o TIA, diabetic peripheral neuropathy, PVD, headaches, sleep dysfunction, anxiety (refer to problem list and past medical/surgical history for full PMH)  are also affecting patient's functional outcome.   REHAB POTENTIAL: Good  CLINICAL DECISION MAKING: Evolving/moderate complexity  EVALUATION COMPLEXITY: Moderate   GOALS: Goals reviewed with patient? Yes  SHORT TERM GOALS: Target date: 10/19/2021   Patient will be independent with initial HEP to improve outcomes and carryover.  Baseline:  Goal status: IN PROGRESS  2.  Patient will report centralization of radicular symptoms.   Baseline:  Goal status: IN PROGRESS  10/13/21 - Pt noting less pain down her legs  LONG TERM GOALS: Target date: 11/16/2021    Patient will be independent with ongoing/advanced HEP for self-management at home.  Baseline:  Goal status: IN PROGRESS  2.  Patient will report 50-75% improvement in low back pain to improve QOL.  Baseline:  Goal status: IN PROGRESS  3.  Patient to demonstrate ability to achieve and maintain good spinal alignment/posturing and body mechanics needed for daily activities. Baseline:  Goal status: IN PROGRESS  4.  Patient will demonstrate functional pain free lumbar ROM to perform ADLs.   Baseline:  Goal status: IN PROGRESS  5.  Patient will demonstrate improved B LE strength to >/= 4/5 for improved stability and ease of mobility . Baseline:  Goal status: IN PROGRESS  6.  Patient will report 73 on lumbar FOTO to demonstrate improved functional ability.  Baseline: 28 Goal  status: IN PROGRESS   7.  Patient will tolerate 20-30 min of sitting w/o increased pain to improve comfort with eating meals or driving. Baseline:  Goal status: IN PROGRESS   PLAN: PT FREQUENCY: 2x/week  PT DURATION: 8 weeks  PLANNED INTERVENTIONS: Therapeutic exercises, Therapeutic activity, Neuromuscular re-education, Balance training, Gait training, Patient/Family education, Self Care, Joint mobilization, Stair training, Aquatic Therapy, Dry Needling, Electrical stimulation, Spinal mobilization, Cryotherapy, Moist heat, Taping, Traction, Ultrasound, Ionotophoresis '4mg'$ /ml Dexamethasone, Manual therapy, and Re-evaluation.  PLAN FOR NEXT SESSION: STG assessment; review of posture and body mechanics education PRN; gently progress lumbopelvic flexibility and strengthening - update HEP as indicated; MT +/- DN to address pain and abnormal muscle tension; modalities PRN for pain; formal re-eval for neck per pt preference    Percival Spanish, PT 10/13/2021, 4:51 PM

## 2021-10-13 NOTE — Patient Instructions (Addendum)
Multivitamin with minerals such as Centrum Silver, One a Day [place] Recommend calcium intake of 1200 to 1500 mg daily, divided into roughly 3 doses. Best source is the diet and a single dairy serving is about 500 mg, a supplement of calcium citrate once or twice daily to balance diet is fine if not getting enough in diet. Also need Vitamin D 2000 IU caps, 1 cap daily if not already taking vitamin D. Also recommend weight baring exercise on hips and upper body to keep bones strong   RSV (respiratory syncitial virus) vaccine at pharmacy Covid booster when new version out late September At pharmacy   Peripheral Neuropathy Peripheral neuropathy is a type of nerve damage. It affects nerves that carry signals between the spinal cord and the arms, legs, and the rest of the body (peripheral nerves). It does not affect nerves in the spinal cord or brain. In peripheral neuropathy, one nerve or a group of nerves may be damaged. Peripheral neuropathy is a broad category that includes many specific nerve disorders, like diabetic neuropathy, hereditary neuropathy, and carpal tunnel syndrome. What are the causes? This condition may be caused by: Certain diseases, such as: Diabetes. This is the most common cause of peripheral neuropathy. Autoimmune diseases, such as rheumatoid arthritis and systemic lupus erythematosus. Nerve diseases that are passed from parent to child (inherited). Kidney disease. Thyroid disease. Other causes may include: Nerve injury. Pressure or stress on a nerve that lasts a long time. Lack (deficiency) of B vitamins. This can result from alcoholism, poor diet, or a restricted diet. Infections. Some medicines, such as cancer medicines (chemotherapy). Poisonous (toxic) substances, such as lead and mercury. Too little blood flowing to the legs. In some cases, the cause of this condition is not known. What are the signs or symptoms? Symptoms of this condition depend on which of  your nerves is damaged. Symptoms in the legs, hands, and arms can include: Loss of feeling (numbness) in the feet, hands, or both. Tingling in the feet, hands, or both. Burning pain. Very sensitive skin. Weakness. Not being able to move a part of the body (paralysis). Clumsiness or poor coordination. Muscle twitching. Loss of balance. Symptoms in other parts of the body can include: Not being able to control your bladder. Feeling dizzy. Sexual problems. How is this diagnosed? Diagnosing and finding the cause of peripheral neuropathy can be difficult. Your health care provider will take your medical history and do a physical exam. A neurological exam will also be done. This involves checking things that are affected by your brain, spinal cord, and nerves (nervous system). For example, your health care provider will check your reflexes, how you move, and what you can feel. You may have other tests, such as: Blood tests. Electromyogram (EMG) and nerve conduction tests. These tests check nerve function and how well the nerves are controlling the muscles. Imaging tests, such as a CT scan or MRI, to rule out other causes of your symptoms. Removing a small piece of nerve to be examined in a lab (nerve biopsy). Removing and examining a small amount of the fluid that surrounds the brain and spinal cord (lumbar puncture). How is this treated? Treatment for this condition may involve: Treating the underlying cause of the neuropathy, such as diabetes, kidney disease, or vitamin deficiencies. Stopping medicines that can cause neuropathy, such as chemotherapy. Medicine to help relieve pain. Medicines may include: Prescription or over-the-counter pain medicine. Anti-seizure medicine. Antidepressants. Pain-relieving patches that are applied to painful areas  of skin. Surgery to relieve pressure on a nerve or to destroy a nerve that is causing pain. Physical therapy to help improve movement and  balance. Devices to help you move around (assistive devices). Follow these instructions at home: Medicines Take over-the-counter and prescription medicines only as told by your health care provider. Do not take any other medicines without first asking your health care provider. Ask your health care provider if the medicine prescribed to you requires you to avoid driving or using machinery. Lifestyle  Do not use any products that contain nicotine or tobacco. These products include cigarettes, chewing tobacco, and vaping devices, such as e-cigarettes. Smoking keeps blood from reaching damaged nerves. If you need help quitting, ask your health care provider. Avoid or limit alcohol. Too much alcohol can cause a vitamin B deficiency, and vitamin B is needed for healthy nerves. Eat a healthy diet. This includes: Eating foods that are high in fiber, such as beans, whole grains, and fresh fruits and vegetables. Limiting foods that are high in fat and processed sugars, such as fried or sweet foods. General instructions  If you have diabetes, work closely with your health care provider to keep your blood sugar under control. If you have numbness in your feet: Check every day for signs of injury or infection. Watch for redness, warmth, and swelling. Wear padded socks and comfortable shoes. These help protect your feet. Develop a good support system. Living with peripheral neuropathy can be stressful. Consider talking with a mental health specialist or joining a support group. Use assistive devices and attend physical therapy as told by your health care provider. This may include using a walker or a cane. Keep all follow-up visits. This is important. Where to find more information [employer] of Neurological Disorders: [employer] Contact a health care provider if: You have new signs or symptoms of peripheral neuropathy. You are struggling emotionally from dealing with peripheral  neuropathy. Your pain is not well controlled. Get help right away if: You have an injury or infection that is not healing normally. You develop new weakness in an arm or leg. You have fallen or do so frequently. Summary Peripheral neuropathy is when the nerves in the arms or legs are damaged, resulting in numbness, weakness, or pain. There are many causes of peripheral neuropathy, including diabetes, pinched nerves, vitamin deficiencies, autoimmune disease, and hereditary conditions. Diagnosing and finding the cause of peripheral neuropathy can be difficult. Your health care provider will take your medical history, do a physical exam, and do tests, including blood tests and nerve function tests. Treatment involves treating the underlying cause of the neuropathy and taking medicines to help control pain. Physical therapy and assistive devices may also help. This information is not intended to replace advice given to you by your health care provider. Make sure you discuss any questions you have with your health care provider. Document Revised: 09/28/2020 Document Reviewed: 09/28/2020 Elsevier Patient Education  [place].

## 2021-10-14 ENCOUNTER — Other Ambulatory Visit (HOSPITAL_BASED_OUTPATIENT_CLINIC_OR_DEPARTMENT_OTHER): Payer: Self-pay

## 2021-10-14 ENCOUNTER — Encounter: Payer: Medicare Other | Admitting: Physical Therapy

## 2021-10-16 NOTE — Assessment & Plan Note (Signed)
Refill given on Betamethasone to use sparingly

## 2021-10-17 ENCOUNTER — Ambulatory Visit: Payer: Medicare Other | Admitting: Physical Therapy

## 2021-10-17 ENCOUNTER — Encounter: Payer: Self-pay | Admitting: Physical Therapy

## 2021-10-17 DIAGNOSIS — M6281 Muscle weakness (generalized): Secondary | ICD-10-CM | POA: Diagnosis not present

## 2021-10-17 DIAGNOSIS — M5459 Other low back pain: Secondary | ICD-10-CM

## 2021-10-17 DIAGNOSIS — R2689 Other abnormalities of gait and mobility: Secondary | ICD-10-CM | POA: Diagnosis not present

## 2021-10-17 DIAGNOSIS — M62830 Muscle spasm of back: Secondary | ICD-10-CM | POA: Diagnosis not present

## 2021-10-17 DIAGNOSIS — M5412 Radiculopathy, cervical region: Secondary | ICD-10-CM | POA: Diagnosis not present

## 2021-10-17 DIAGNOSIS — M542 Cervicalgia: Secondary | ICD-10-CM | POA: Diagnosis not present

## 2021-10-17 DIAGNOSIS — R293 Abnormal posture: Secondary | ICD-10-CM | POA: Diagnosis not present

## 2021-10-17 DIAGNOSIS — R252 Cramp and spasm: Secondary | ICD-10-CM | POA: Diagnosis not present

## 2021-10-17 DIAGNOSIS — R262 Difficulty in walking, not elsewhere classified: Secondary | ICD-10-CM | POA: Diagnosis not present

## 2021-10-17 NOTE — Therapy (Signed)
OUTPATIENT PHYSICAL THERAPY TREATMENT / PROGRESS NOTE   Patient Name: [name] MRN: [id number] DOB:1940-10-17, 81 y.o., female Today's Date: 10/17/2021  Progress Note  Reporting Period 09/21/2021 to 10/17/2021  See note below for Objective Data and Assessment of Progress/Goals.      PT End of Session - 10/17/21 1318     Visit Number 7    Date for PT Re-Evaluation 11/16/21    Authorization Type Medicare    PT Start Time 1318    PT Stop Time 1402    PT Time Calculation (min) 44 min    Activity Tolerance Patient tolerated treatment well    Behavior During Therapy [facility] for tasks assessed/performed                   Past Medical History:  Diagnosis Date   Abdominal pain in female 03/18/2010   Qualifier: Diagnosis of  By: Carlean Purl MD, Tonna Boehringer E    Anemia 06/08/2014   Anxiety    Arthritis    Spinal Osteoarthritis   Cancer ([place])    Carcinoid tumor of stomach    Cataract    Chest pain    Myoview 12/15 no ischemia.   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 ([place]) 09/05/2006   Qualifier: Diagnosis of  By: Marca Ancona RMA, Lucy     Diabetic peripheral neuropathy ([place]) 10/29/2013   Encounter for preventative adult health care exam with abnormal findings 09/14/2013   Esophageal reflux    Gastric polyp    Fundic Gland   Gastroparesis    Headache(784.0)    Heart murmur    Echocardiogram 2/11: EF 60-65%, mild LAE, grade 1 diastolic dysfunction, aortic valve sclerosis, mean gradient 9 mm of mercury, PASP 34   Hematuria 03/16/2016   Iron deficiency anemia, unspecified    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   Leg swelling    bilateral   Neck pain 04/22/2015   PONV (postoperative nausea and vomiting)    pt states only needs small amount of anesthesia   PSVT (paroxysmal supraventricular tachycardia) ([place])    Pure hypercholesterolemia    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   Renal insufficiency 06/18/2019    Stroke ([place])    tia, 2014   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   Type II or unspecified type diabetes mellitus without mention of complication, not stated as uncontrolled    Unspecified essential hypertension    Unspecified hereditary and idiopathic peripheral neuropathy 10/29/2013   Past Surgical History:  Procedure Laterality Date   CHOLECYSTECTOMY  1993   COLONOSCOPY  11/11/2010   diverticulosis   DILATATION & CURRETTAGE/HYSTEROSCOPY WITH RESECTOCOPE N/A 02/25/2013   Procedure: Attempted hysteroscopy with uterine perforation;  Surgeon: Jamey Reas de Berton Lan, MD;  Location: [facility];  Service: Gynecology;  Laterality: N/A;   ESOPHAGOGASTRODUODENOSCOPY  08/29/2010; 09/15/2010   Carcinoid tumor less than 1 cm in July 2012 not seen in August 2012 , gastritis, fundic gland polyps   ESOPHAGOGASTRODUODENOSCOPY  05/16/2011   ESOPHAGOGASTRODUODENOSCOPY  06/14/2012   EUS  12/15/2010   Procedure: UPPER ENDOSCOPIC ULTRASOUND (EUS) LINEAR;  Surgeon: Owens Loffler, MD;  Location: [facility];  Service: Endoscopy;  Laterality: N/A;   EYE SURGERY Bilateral    Bi lateral cateracts and bi lateral laser   LAPAROSCOPY N/A 02/25/2013   Procedure: Cystoscopy and laparoscopy with fulguration of uterine serosa;  Surgeon: Jamey Reas de Berton Lan, MD;  Location:  [facility];  Service: Gynecology;  Laterality: N/A;   TONSILLECTOMY     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   Renal insufficiency 06/18/2019   Educated about COVID-19 virus infection 05/15/2019   Atrophic vaginitis 01/20/2019   Stenosis of carotid artery 11/12/2018   Anxiety 10/13/2018   Thrush 12/07/2017   Sinusitis 10/11/2017   Headache  07/12/2017   Dizzy spells 11/21/2016   Constipation 11/21/2016   Nonrheumatic aortic valve stenosis 05/23/2016   Aortic atherosclerosis ([facility]) 05/23/2016   Hematuria 03/16/2016   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   Pain of upper abdomen 06/06/2015   Neck pain 04/22/2015   Ear pain 02/28/2015   Abnormal urine 11/21/2014   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   Anemia 06/08/2014   RLS (restless legs syndrome) 11/23/2013   Diabetic peripheral neuropathy ([place]) 10/29/2013   Left-sided thoracic back pain 10/06/2013   Encounter for preventative adult health care exam with abnormal findings 09/14/2013   Iron deficiency anemia    Status post laparoscopy 02/25/2013   Hyponatremia 01/09/2013   GERD (gastroesophageal reflux disease) 01/09/2013   Amaurosis fugax of left eye 10/16/2012   Low back pain 06/03/2012   Vitamin D deficiency 03/11/2012   Bilateral hand pain 10/20/2011   Encounter for long-term (current) use of other medications 10/20/2011   IBS (irritable bowel syndrome) 08/14/2011   TIA (transient ischemic attack) 02/10/2011   Abnormal brain CT 01/19/2011   Allergic rhinitis 10/01/2010   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   FUNDIC GLAND POLYPS OF STOMACH 03/18/2010   Abdominal pain in female 03/18/2010   Pain in joint 54/98/2641   SYSTOLIC MURMUR 58/30/9407   Paroxysmal supraventricular tachycardia ([place]) 01/12/2009   PLANTAR FASCIITIS 06/08/2008   CHEST PAIN 05/18/2008   Gastroparesis 12/18/2007   HYPERCHOLESTEROLEMIA 06/11/2007   Diabetes mellitus type 2 in obese ([place]) 09/05/2006   Essential hypertension 09/05/2006    PCP: [name], MD  REFERRING PROVIDER: Marrian Salvage, FNP  REFERRING DIAG: [phone] (ICD-10-CM) - Chronic right-sided low back pain with right-sided sciatica  RATIONALE FOR EVALUATION AND TREATMENT: Rehabilitation  THERAPY DIAG:  Other low back pain  Muscle spasm of back  Muscle weakness  (generalized)  Difficulty in walking, not elsewhere classified  Other abnormalities of gait and mobility  ONSET DATE: acute on chronic exacerbation ~3 weeks ago  NEXT MD VISIT: 03/28/22 with PCP   SUBJECTIVE:  SUBJECTIVE STATEMENT: Pt reports pain has not changed much since last visit. She reports the MD told her she was anemic after her visit last week - she has an appt pending with heme-oncology to see if she will need an infusion.  PAIN:  Are you having pain? Yes: NPRS scale:  4/10 Pain location: R>L low back with R LE radicular pain and burning sensation Pain description: burning, numbness Aggravating factors: prolonged sitting, bending forward, driving, prolonged standing or walking Relieving factors: heat, hot shower, topical pain cream  Are you having pain? Yes: NPRS scale:  7/10 Pain location: Neck - L upper & R lower Pain description: sharp Aggravating factors: sleeping on L side Relieving factors: chin tuck exercises   PERTINENT HISTORY: Extensive PMH including chronic back and neck pain, OA, osteoporosis, DM, HTN, h/o TIA, diabetic peripheral neuropathy, PVD, headaches, sleep dysfunction, anxiety (refer to problem list and past medical/surgical history for full PMH)  PRECAUTIONS: None  WEIGHT BEARING RESTRICTIONS: No  FALLS:  Has patient fallen in last 6 months? No  LIVING ENVIRONMENT: Lives with: lives alone Lives in: House/apartment Stairs: No Has following equipment at home: Single point cane, Environmental consultant - 4 wheeled, shower chair, and Grab bars  OCCUPATION: Retired  PLOF: Independent and Leisure: read and play card games on her phone, has been doing her prior PT HEP recently qod since pain started up  PATIENT GOALS: "No pain."   OBJECTIVE:   DIAGNOSTIC FINDINGS:  05/25/20  - Lumbar x-ray:  FINDINGS: Decreased osseous mineralization. Stable lumbar vertebral body heights and alignment. Mild disc space narrowing is similar. Small endplate osteophytes are present at L2-L3 and L3-L4. Mild facet hypertrophy. Aortic calcification. IMPRESSION: Mild lumbar spondylosis similar to prior study (2018).  PATIENT SURVEYS:  FOTO Lumbar = 28; predicted = 46  SCREENING FOR RED FLAGS: Bowel or bladder incontinence: No Spinal tumors: No Cauda equina syndrome: No Compression fracture: No Abdominal aneurysm: No  COGNITION:  Overall cognitive status: Within functional limits for tasks assessed     SENSATION: Light touch: Impaired  numbness in B feet  MUSCLE LENGTH: Hamstrings: mod tight R>L ITB: mod tight R>L Piriformis: mod tight B Hip flexors: NT Quads: NT Heelcord: NT  POSTURE:  rounded shoulders, forward head, decreased lumbar lordosis, and increased thoracic kyphosis  PALPATION: Increased muscle tension and TTP over lumbar paraspinals and R glutes/piriformis and lateral hip  LUMBAR ROM:   Active  AROM  Eval - 09/21/21  10/17/21  Flexion Hands level with knees Hands to mid shins  Extension 90% limited 75% limited  Right lateral flexion Hand to mid thigh Hand to lateral knee  Left lateral flexion Hand to mid thigh Hand to lateral knee  Right rotation 80% limited 50% limited  Left rotation 80% limited 50% limited    LOWER EXTREMITY ROM:     Mildly limited primarily due to muscle tightness  LOWER EXTREMITY MMT:    MMT Right eval Left eval Right 10/17/21 Left  10/17/21  Hip flexion 3+ 3+ 3+ 3+  Hip extension 3- 3- 3 3-  Hip abduction 4- 3+ 4 4-  Hip adduction 3+ 3+ 3+ 3+  Hip internal rotation 3+ 3+ 4+ 4+  Hip external rotation 3+ 3+ 4- 4-  Knee flexion 4- 4- 4+ 4+  Knee extension 4- 4- 4 4  Ankle dorsiflexion 3+ 3+ 4- 4-  Ankle plantarflexion 3- 3-    Ankle inversion      Ankle eversion       (Blank rows =  not tested)  LUMBAR SPECIAL  TESTS:  Straight leg raise test: Positive on Right   TODAY'S TREATMENT:  10/17/21 THERAPEUTIC EXERCISE: to improve flexibility, strength and mobility.  Verbal and tactile cues throughout for technique.  NuStep - L5 x 6 min (UE/LE) R/L sidelying clam 2 x 10 Brace marching 2 x 10  THERAPEUTIC ACTIVITIES: MMT Goal assessment  MANUAL THERAPY: To promote normalized muscle tension, improved flexibility, and reduced pain. STM/DTM and manual TPR to R glute medius/minimus and piriformis   10/13/21 THERAPEUTIC EXERCISE: to improve flexibility, strength and mobility.  Verbal and tactile cues throughout for technique.  NuStep - L5 x 5 min (UE/LE) Thoracolumbar extension over back of chair 10 x 3"   SELF CARE:  Provided education in proper posture and body mechanics for typical daily positioning and household chores to minimize strain on low back and neck - PT demonstrating positions and movement patterns with pt performing return demonstration while PT helping to determine modifications as indicated.   10/06/21 THERAPEUTIC EXERCISE: to improve flexibility, strength and mobility.  Verbal and tactile cues throughout for technique.  Rec Bike - L1 x 5 min Lumbar extension in standing with back of hip/buttocks against cabinets 10 x 5" Hooklying on wedge + pillow with towel roll under neck: TrA + RTB bent-knee fallout 10 x 3" TrA + RTB slow march 10 x 3" Bridge + RTB hip ABD isometric 10 x 3" Side-lying R/L RTB clam 10 x 3" Seated R/L hip hinge HS stretch 2 x 30" (w/o stool) - clarified again for HEP at pt request    PATIENT EDUCATION:  Education details: HEP update Person educated: Patient Education method: Explanation, Demonstration, Tactile cues, Verbal cues, and Handouts Education comprehension: verbalized understanding, returned demonstration, verbal cues required, tactile cues required, and needs further education   HOME EXERCISE PROGRAM: Access Code: [email or web address] URL:  [email or web address] Date: 10/17/2021 Prepared by: Annie Paras  Exercises - Hooklying Single Knee to Chest Stretch  - 2-3 x daily - 7 x weekly - 3 reps - 30 sec hold - Supine Piriformis Stretch with Foot on Ground  - 2-3 x daily - 7 x weekly - 3 reps - 30 sec hold - Supine Lower Trunk Rotation  - 2-3 x daily - 7 x weekly - 5 reps - 10 sec hold - Supine Posterior Pelvic Tilt  - 2-3 x daily - 7 x weekly - 2 sets - 10 reps - 5 sec hold - Supine March  - 1 x daily - 7 x weekly - 2 sets - 10 reps - 3 sec hold - Bent Knee Fallouts with Alternating Legs  - 1 x daily - 7 x weekly - 2 sets - 10 reps - 3 sec hold - Supine Transversus Abdominis Bracing with Leg Extension  - 1 x daily - 7 x weekly - 2 sets - 10 reps - 3 sec hold - Seated Hamstring Stretch  - 2-3 x daily - 7 x weekly - 3 reps - 30 sec hold - Seated Flexion Stretch with Swiss Ball  - 2-3 x daily - 7 x weekly - 3 reps - 30 sec hold - Seated Thoracic Flexion and Rotation with Swiss Ball  - 2-3 x daily - 7 x weekly - 2-3 reps - 30 sec hold - Modified Thomas Stretch (Mirrored)  - 2-3 x daily - 3 x weekly - 3 reps - 30 sec hold - Hooklying Single Leg Bent Knee Fallouts with Resistance  - 1 x daily - 3  x weekly - 2 sets - 10 reps - 3 sec hold - Supine March with Resistance Band  - 1 x daily - 3 x weekly - 2 sets - 10 reps - 2-3 sec hold hold - Bridge with Resistance  - 1 x daily - 3 x weekly - 2 sets - 10 reps - 5 sec hold - Standing Lumbar Extension with Counter  - 1 x daily - 7 x weekly - 2 sets - 10 reps - 3 sec hold - Beginner Clam  - 1 x daily - 3 x weekly - 2 sets - 10 reps - 3 sec hold  Patient Education - Posture and Body Mechanics   ASSESSMENT:  CLINICAL IMPRESSION: Jailah reports 70% reduction in LBP since start of PT. Lumbar ROM improving w/o limitation due to pain and gains noted in B LE strength, although continue proximal LE and core weakness still evident. Pt notes some difficulties donning RTB for resisted  lumbopelvic strengthening, therefore instructed pt to complete movements w/o band resistance at home with more gravity resisted strengthening added and will progress to more upright core and postural as well as LE strengthening in upcoming visits. Jaeli is progressing well toward her goals and will continue to benefit from skilled PT to address remaining pain, strength and ROM deficits. Once she is comfortable with managing her LBP, will plan for re-eval to address pending referral from PCP for neck pain.  OBJECTIVE IMPAIRMENTS: decreased activity tolerance, decreased endurance, decreased knowledge of condition, decreased mobility, difficulty walking, decreased ROM, decreased strength, hypomobility, increased fascial restrictions, impaired perceived functional ability, increased muscle spasms, impaired flexibility, impaired sensation, improper body mechanics, postural dysfunction, and pain.   ACTIVITY LIMITATIONS: carrying, lifting, bending, sitting, standing, squatting, sleeping, stairs, transfers, bed mobility, bathing, toileting, dressing, reach over head, and locomotion level  PARTICIPATION LIMITATIONS: meal prep, cleaning, laundry, driving, shopping, community activity, and yard work  PERSONAL FACTORS: Age, Fitness, Past/current experiences, Time since onset of injury/illness/exacerbation, and 3+ comorbidities: Extensive PMH including chronic back and neck pain, OA, osteoporosis, DM, HTN, h/o TIA, diabetic peripheral neuropathy, PVD, headaches, sleep dysfunction, anxiety (refer to problem list and past medical/surgical history for full PMH)  are also affecting patient's functional outcome.   REHAB POTENTIAL: Good  CLINICAL DECISION MAKING: Evolving/moderate complexity  EVALUATION COMPLEXITY: Moderate   GOALS: Goals reviewed with patient? Yes  SHORT TERM GOALS: Target date: 10/19/2021   Patient will be independent with initial HEP to improve outcomes and carryover.  Baseline:  Goal  status: MET  10/17/21  2.  Patient will report centralization of radicular symptoms.  Baseline:  Goal status: IN PROGRESS  10/17/21 - Pt reports radicular pain now only intermittent and less intense when present - mostly in R thigh but still occasionally to calf  LONG TERM GOALS: Target date: 11/16/2021    Patient will be independent with ongoing/advanced HEP for self-management at home.  Baseline:  Goal status: IN PROGRESS   2.  Patient will report 50-75% improvement in low back pain to improve QOL.  Baseline:  Goal status: MET  10/17/21 - Pt reports 70% reduction in pain  3.  Patient to demonstrate ability to achieve and maintain good spinal alignment/posturing and body mechanics needed for daily activities. Baseline:  Goal status: IN PROGRESS  10/17/21 - education provided last visit and pt denies any questions/concerns  4.  Patient will demonstrate functional pain free lumbar ROM to perform ADLs.   Baseline:  Goal status: PARTIALLY MET  10/17/21 - Lumbar ROM  improving w/o pain reported  5.  Patient will demonstrate improved B LE strength to >/= 4/5 for improved stability and ease of mobility . Baseline:  Goal status: PARTIALLY MET  10/17/21 - Refer to above MMT table  6.  Patient will report 46 on lumbar FOTO to demonstrate improved functional ability.  Baseline: 28 Goal status: IN PROGRESS   7.  Patient will tolerate 20-30 min of sitting w/o increased pain to improve comfort with eating meals or driving. Baseline:  Goal status: IN PROGRESS   PLAN: PT FREQUENCY: 2x/week  PT DURATION: 8 weeks  PLANNED INTERVENTIONS: Therapeutic exercises, Therapeutic activity, Neuromuscular re-education, Balance training, Gait training, Patient/Family education, Self Care, Joint mobilization, Stair training, Aquatic Therapy, Dry Needling, Electrical stimulation, Spinal mobilization, Cryotherapy, Moist heat, Taping, Traction, Ultrasound, Ionotophoresis 43m/ml Dexamethasone, Manual therapy, and  Re-evaluation.  PLAN FOR NEXT SESSION:  gently progress lumbopelvic flexibility and strengthening - update HEP as indicated; MT +/- DN to address pain and abnormal muscle tension; modalities PRN for pain; formal re-eval for neck per pt preference; review of posture and body mechanics education PRN   JPercival Spanish PT 10/17/2021, 7:35 PM

## 2021-10-20 ENCOUNTER — Other Ambulatory Visit: Payer: Self-pay | Admitting: Family Medicine

## 2021-10-20 ENCOUNTER — Encounter: Payer: Self-pay | Admitting: Physical Therapy

## 2021-10-20 ENCOUNTER — Telehealth: Payer: Self-pay | Admitting: Family Medicine

## 2021-10-20 ENCOUNTER — Ambulatory Visit: Payer: Medicare Other | Admitting: Physical Therapy

## 2021-10-20 DIAGNOSIS — R2689 Other abnormalities of gait and mobility: Secondary | ICD-10-CM

## 2021-10-20 DIAGNOSIS — M6281 Muscle weakness (generalized): Secondary | ICD-10-CM

## 2021-10-20 DIAGNOSIS — R262 Difficulty in walking, not elsewhere classified: Secondary | ICD-10-CM

## 2021-10-20 DIAGNOSIS — M62830 Muscle spasm of back: Secondary | ICD-10-CM | POA: Diagnosis not present

## 2021-10-20 DIAGNOSIS — R293 Abnormal posture: Secondary | ICD-10-CM | POA: Diagnosis not present

## 2021-10-20 DIAGNOSIS — R252 Cramp and spasm: Secondary | ICD-10-CM | POA: Diagnosis not present

## 2021-10-20 DIAGNOSIS — M5412 Radiculopathy, cervical region: Secondary | ICD-10-CM | POA: Diagnosis not present

## 2021-10-20 DIAGNOSIS — M5459 Other low back pain: Secondary | ICD-10-CM | POA: Diagnosis not present

## 2021-10-20 DIAGNOSIS — M542 Cervicalgia: Secondary | ICD-10-CM | POA: Diagnosis not present

## 2021-10-20 NOTE — Therapy (Signed)
OUTPATIENT PHYSICAL THERAPY TREATMENT   Patient Name: [name] MRN: [id number] DOB:Feb 12, 1940, 81 y.o., female Today's Date: 10/20/2021     PT End of Session - 10/20/21 1400     Visit Number 8    Date for PT Re-Evaluation 11/16/21    Authorization Type Medicare    Progress Note Due on Visit 47   MD PN on visit #7 - 10/17/21   PT Start Time 1400    PT Stop Time 1445    PT Time Calculation (min) 45 min    Activity Tolerance Patient tolerated treatment well    Behavior During Therapy [facility] for tasks assessed/performed                    Past Medical History:  Diagnosis Date   Abdominal pain in female 03/18/2010   Qualifier: Diagnosis of  By: Carlean Purl MD, Tonna Boehringer E    Anemia 06/08/2014   Anxiety    Arthritis    Spinal Osteoarthritis   Cancer ([place])    Carcinoid tumor of stomach    Cataract    Chest pain    Myoview 12/15 no ischemia.   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 ([place]) 09/05/2006   Qualifier: Diagnosis of  By: Marca Ancona RMA, Lucy     Diabetic peripheral neuropathy ([place]) 10/29/2013   Encounter for preventative adult health care exam with abnormal findings 09/14/2013   Esophageal reflux    Gastric polyp    Fundic Gland   Gastroparesis    Headache(784.0)    Heart murmur    Echocardiogram 2/11: EF 60-65%, mild LAE, grade 1 diastolic dysfunction, aortic valve sclerosis, mean gradient 9 mm of mercury, PASP 34   Hematuria 03/16/2016   Iron deficiency anemia, unspecified    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   Leg swelling    bilateral   Neck pain 04/22/2015   PONV (postoperative nausea and vomiting)    pt states only needs small amount of anesthesia   PSVT (paroxysmal supraventricular tachycardia) ([place])    Pure hypercholesterolemia    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   Renal insufficiency 06/18/2019   Stroke ([place])    tia, 2014   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   Type II or unspecified type  diabetes mellitus without mention of complication, not stated as uncontrolled    Unspecified essential hypertension    Unspecified hereditary and idiopathic peripheral neuropathy 10/29/2013   Past Surgical History:  Procedure Laterality Date   CHOLECYSTECTOMY  1993   COLONOSCOPY  11/11/2010   diverticulosis   DILATATION & CURRETTAGE/HYSTEROSCOPY WITH RESECTOCOPE N/A 02/25/2013   Procedure: Attempted hysteroscopy with uterine perforation;  Surgeon: Jamey Reas de Berton Lan, MD;  Location: [facility];  Service: Gynecology;  Laterality: N/A;   ESOPHAGOGASTRODUODENOSCOPY  08/29/2010; 09/15/2010   Carcinoid tumor less than 1 cm in July 2012 not seen in August 2012 , gastritis, fundic gland polyps   ESOPHAGOGASTRODUODENOSCOPY  05/16/2011   ESOPHAGOGASTRODUODENOSCOPY  06/14/2012   EUS  12/15/2010   Procedure: UPPER ENDOSCOPIC ULTRASOUND (EUS) LINEAR;  Surgeon: Owens Loffler, MD;  Location: [facility];  Service: Endoscopy;  Laterality: N/A;   EYE SURGERY Bilateral    Bi lateral cateracts and bi lateral laser   LAPAROSCOPY N/A 02/25/2013   Procedure: Cystoscopy and laparoscopy with fulguration of uterine serosa;  Surgeon: Jamey Reas de Berton Lan, MD;  Location: [facility];  Service: Gynecology;  Laterality: N/A;   TONSILLECTOMY     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   Renal insufficiency 06/18/2019   Educated about COVID-19 virus infection 05/15/2019   Atrophic vaginitis 01/20/2019   Stenosis of carotid artery 11/12/2018   Anxiety 10/13/2018   Thrush 12/07/2017   Sinusitis 10/11/2017   Headache 07/12/2017   Dizzy spells 11/21/2016   Constipation 11/21/2016   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   Aortic atherosclerosis ([facility]) 05/23/2016   Hematuria 03/16/2016   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   Pain of upper abdomen 06/06/2015   Neck pain 04/22/2015   Ear pain 02/28/2015   Abnormal urine 11/21/2014   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   Anemia 06/08/2014   RLS (restless legs syndrome) 11/23/2013   Diabetic peripheral neuropathy ([place]) 10/29/2013   Left-sided thoracic back pain 10/06/2013   Encounter for preventative adult health care exam with abnormal findings 09/14/2013   Iron deficiency anemia    Status post laparoscopy 02/25/2013   Hyponatremia 01/09/2013   GERD (gastroesophageal reflux disease) 01/09/2013   Amaurosis fugax of left eye 10/16/2012   Low back pain 06/03/2012   Vitamin D deficiency 03/11/2012   Bilateral hand pain 10/20/2011   Encounter for long-term (current) use of other medications 10/20/2011   IBS (irritable bowel syndrome) 08/14/2011   TIA (transient ischemic attack) 02/10/2011   Abnormal brain CT 01/19/2011   Allergic rhinitis 10/01/2010   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   FUNDIC GLAND POLYPS OF STOMACH 03/18/2010   Abdominal pain in female 03/18/2010   Pain in joint 94/37/0052   SYSTOLIC MURMUR 59/11/2888   Paroxysmal supraventricular tachycardia ([place]) 01/12/2009   PLANTAR FASCIITIS 06/08/2008   CHEST PAIN 05/18/2008   Gastroparesis 12/18/2007   HYPERCHOLESTEROLEMIA 06/11/2007   Diabetes mellitus type 2 in obese ([place]) 09/05/2006   Essential hypertension 09/05/2006    PCP: [name], MD  REFERRING PROVIDER: Marrian Salvage, FNP  REFERRING DIAG: [phone] (ICD-10-CM) - Chronic right-sided low back pain with right-sided sciatica  RATIONALE FOR EVALUATION AND TREATMENT: Rehabilitation  THERAPY DIAG:  Other low back pain  Muscle spasm of back  Muscle weakness (generalized)  Difficulty in walking, not elsewhere classified  Other abnormalities of gait and  mobility  ONSET DATE: acute on chronic exacerbation ~3 weeks ago  NEXT MD VISIT: 03/28/22 with PCP   SUBJECTIVE:  SUBJECTIVE STATEMENT: Pt reports she went to [place] for her grocery shopping yesterday - notes still limited tolerance with standing/walking but doing better.  PAIN:  Are you having pain? Yes: NPRS scale:  4/10 Pain location: R>L low back with R LE radicular pain and burning sensation Pain description: burning, numbness Aggravating factors: prolonged sitting, bending forward, driving, prolonged standing or walking Relieving factors: heat, hot shower, topical pain cream  Are you having pain? Yes: NPRS scale: 6-7/10 Pain location: Neck - L upper & R lower Pain description: sharp Aggravating factors: sleeping on L side Relieving factors: chin tuck exercises   PERTINENT HISTORY: Extensive PMH including chronic back and neck pain, OA, osteoporosis, DM, HTN, h/o TIA, diabetic peripheral neuropathy, PVD, headaches, sleep dysfunction, anxiety (refer to problem list and past medical/surgical history for full PMH)  PRECAUTIONS: None  WEIGHT BEARING RESTRICTIONS: No  FALLS:  Has patient fallen in last 6 months? No  LIVING ENVIRONMENT: Lives with: lives alone Lives in: House/apartment Stairs: No Has following equipment at home: Single point cane, Environmental consultant - 4 wheeled, shower chair, and Grab bars  OCCUPATION: Retired  PLOF: Independent and Leisure: read and play card games on her phone, has been doing her prior PT HEP recently qod since pain started up  PATIENT GOALS: "No pain."   OBJECTIVE:   DIAGNOSTIC FINDINGS:  05/25/20 - Lumbar x-ray:  FINDINGS: Decreased osseous mineralization. Stable lumbar vertebral body heights and alignment. Mild disc space narrowing is similar. Small  endplate osteophytes are present at L2-L3 and L3-L4. Mild facet hypertrophy. Aortic calcification. IMPRESSION: Mild lumbar spondylosis similar to prior study (2018).  PATIENT SURVEYS:  FOTO Lumbar = 28; predicted = 46  SCREENING FOR RED FLAGS: Bowel or bladder incontinence: No Spinal tumors: No Cauda equina syndrome: No Compression fracture: No Abdominal aneurysm: No  COGNITION:  Overall cognitive status: Within functional limits for tasks assessed     SENSATION: Light touch: Impaired  numbness in B feet  MUSCLE LENGTH: Hamstrings: mod tight R>L ITB: mod tight R>L Piriformis: mod tight B Hip flexors: NT Quads: NT Heelcord: NT  POSTURE:  rounded shoulders, forward head, decreased lumbar lordosis, and increased thoracic kyphosis  PALPATION: Increased muscle tension and TTP over lumbar paraspinals and R glutes/piriformis and lateral hip  LUMBAR ROM:   Active  AROM  Eval - 09/21/21  10/17/21  Flexion Hands level with knees Hands to mid shins  Extension 90% limited 75% limited  Right lateral flexion Hand to mid thigh Hand to lateral knee  Left lateral flexion Hand to mid thigh Hand to lateral knee  Right rotation 80% limited 50% limited  Left rotation 80% limited 50% limited    LOWER EXTREMITY ROM:     Mildly limited primarily due to muscle tightness  LOWER EXTREMITY MMT:    MMT Right eval Left eval Right 10/17/21 Left  10/17/21  Hip flexion 3+ 3+ 3+ 3+  Hip extension 3- 3- 3 3-  Hip abduction 4- 3+ 4 4-  Hip adduction 3+ 3+ 3+ 3+  Hip internal rotation 3+ 3+ 4+ 4+  Hip external rotation 3+ 3+ 4- 4-  Knee flexion 4- 4- 4+ 4+  Knee extension 4- 4- 4 4  Ankle dorsiflexion 3+ 3+ 4- 4-  Ankle plantarflexion 3- 3-    Ankle inversion      Ankle eversion       (Blank rows = not tested)  LUMBAR SPECIAL TESTS:  Straight leg raise test: Positive on Right   TODAY'S TREATMENT:  10/20/21  THERAPEUTIC EXERCISE: to improve flexibility, strength and mobility.  Verbal  and tactile cues throughout for technique.  NuStep - L5 x 6 min (UE/LE) Heel raises + quad/glute/abdominal sets 10 x 3"; UE support on back of chair Standing alt hip abduction x 10; UE support on back of chair Standing alt hip extension x 10; UE support on back of chair Standing alt hip flexion march x 10; UE support on back of chair Seated TrA + hip adduction ball squeeze isometric 10 x 5"; feet resting on 4" step for all seated exercises to ensure 90/90 hip and knee alignment Seated TrA + RTB alt hip ABD/ER unilateral clam 10 x 3" Seated TrA + RTB rows 10 x 3" Seated TrA + RTB scap retraction/shoulder extension 10 x 3" Seated TrA + RTB pallof press 10 x 3"   10/17/21 THERAPEUTIC EXERCISE: to improve flexibility, strength and mobility.  Verbal and tactile cues throughout for technique.  NuStep - L5 x 6 min (UE/LE) R/L sidelying clam 2 x 10 Brace marching 2 x 10  THERAPEUTIC ACTIVITIES: MMT Goal assessment  MANUAL THERAPY: To promote normalized muscle tension, improved flexibility, and reduced pain. STM/DTM and manual TPR to R glute medius/minimus and piriformis   10/13/21 THERAPEUTIC EXERCISE: to improve flexibility, strength and mobility.  Verbal and tactile cues throughout for technique.  NuStep - L5 x 5 min (UE/LE) Thoracolumbar extension over back of chair 10 x 3"   SELF CARE:  Provided education in proper posture and body mechanics for typical daily positioning and household chores to minimize strain on low back and neck - PT demonstrating positions and movement patterns with pt performing return demonstration while PT helping to determine modifications as indicated.   PATIENT EDUCATION:  Education details: HEP update Person educated: Patient Education method: Explanation, Demonstration, Tactile cues, Verbal cues, and Handouts Education comprehension: verbalized understanding, returned demonstration, verbal cues required, tactile cues required, and needs further  education   HOME EXERCISE PROGRAM: Access Code: [email or web address] URL: [email or web address] Date: 10/17/2021 Prepared by: Annie Paras  Exercises - Hooklying Single Knee to Chest Stretch  - 2-3 x daily - 7 x weekly - 3 reps - 30 sec hold - Supine Piriformis Stretch with Foot on Ground  - 2-3 x daily - 7 x weekly - 3 reps - 30 sec hold - Supine Lower Trunk Rotation  - 2-3 x daily - 7 x weekly - 5 reps - 10 sec hold - Supine Posterior Pelvic Tilt  - 2-3 x daily - 7 x weekly - 2 sets - 10 reps - 5 sec hold - Supine March  - 1 x daily - 7 x weekly - 2 sets - 10 reps - 3 sec hold - Bent Knee Fallouts with Alternating Legs  - 1 x daily - 7 x weekly - 2 sets - 10 reps - 3 sec hold - Supine Transversus Abdominis Bracing with Leg Extension  - 1 x daily - 7 x weekly - 2 sets - 10 reps - 3 sec hold - Seated Hamstring Stretch  - 2-3 x daily - 7 x weekly - 3 reps - 30 sec hold - Seated Flexion Stretch with Swiss Ball  - 2-3 x daily - 7 x weekly - 3 reps - 30 sec hold - Seated Thoracic Flexion and Rotation with Swiss Ball  - 2-3 x daily - 7 x weekly - 2-3 reps - 30 sec hold - Modified Thomas Stretch (Mirrored)  - 2-3 x daily - 3 x weekly -  3 reps - 30 sec hold - Hooklying Single Leg Bent Knee Fallouts with Resistance  - 1 x daily - 3 x weekly - 2 sets - 10 reps - 3 sec hold - Supine March with Resistance Band  - 1 x daily - 3 x weekly - 2 sets - 10 reps - 2-3 sec hold hold - Bridge with Resistance  - 1 x daily - 3 x weekly - 2 sets - 10 reps - 5 sec hold - Standing Lumbar Extension with Counter  - 1 x daily - 7 x weekly - 2 sets - 10 reps - 3 sec hold - Beginner Clam  - 1 x daily - 3 x weekly - 2 sets - 10 reps - 3 sec hold  Patient Education - Posture and Body Mechanics   ASSESSMENT:  CLINICAL IMPRESSION: Keerthana reports improving tolerance for standing and walking but still limited with activities such as grocery shopping. Progressed core and LE strengthening to include more upright  exercises in sitting and standing - pt noting muscle activation ("burn") but denies increased pain otherwise. Cues necessary for posture and core/abdominal muscle engagement during most exercises. We also reviewed awareness of posture when she is sitting and reading as this is likely contributing to both her neck and back pain, reminding her to place her book on something to elevate it close to eye level to avoid slumped posture.  OBJECTIVE IMPAIRMENTS: decreased activity tolerance, decreased endurance, decreased knowledge of condition, decreased mobility, difficulty walking, decreased ROM, decreased strength, hypomobility, increased fascial restrictions, impaired perceived functional ability, increased muscle spasms, impaired flexibility, impaired sensation, improper body mechanics, postural dysfunction, and pain.   ACTIVITY LIMITATIONS: carrying, lifting, bending, sitting, standing, squatting, sleeping, stairs, transfers, bed mobility, bathing, toileting, dressing, reach over head, and locomotion level  PARTICIPATION LIMITATIONS: meal prep, cleaning, laundry, driving, shopping, community activity, and yard work  PERSONAL FACTORS: Age, Fitness, Past/current experiences, Time since onset of injury/illness/exacerbation, and 3+ comorbidities: Extensive PMH including chronic back and neck pain, OA, osteoporosis, DM, HTN, h/o TIA, diabetic peripheral neuropathy, PVD, headaches, sleep dysfunction, anxiety (refer to problem list and past medical/surgical history for full PMH)  are also affecting patient's functional outcome.   REHAB POTENTIAL: Good  CLINICAL DECISION MAKING: Evolving/moderate complexity  EVALUATION COMPLEXITY: Moderate   GOALS: Goals reviewed with patient? Yes  SHORT TERM GOALS: Target date: 10/19/2021   Patient will be independent with initial HEP to improve outcomes and carryover.  Baseline:  Goal status: MET  10/17/21  2.  Patient will report centralization of radicular  symptoms.  Baseline:  Goal status: IN PROGRESS  10/17/21 - Pt reports radicular pain now only intermittent and less intense when present - mostly in R thigh but still occasionally to calf  LONG TERM GOALS: Target date: 11/16/2021    Patient will be independent with ongoing/advanced HEP for self-management at home.  Baseline:  Goal status: IN PROGRESS   2.  Patient will report 50-75% improvement in low back pain to improve QOL.  Baseline:  Goal status: MET  10/17/21 - Pt reports 70% reduction in pain  3.  Patient to demonstrate ability to achieve and maintain good spinal alignment/posturing and body mechanics needed for daily activities. Baseline:  Goal status: IN PROGRESS  10/17/21 - education provided last visit and pt denies any questions/concerns  4.  Patient will demonstrate functional pain free lumbar ROM to perform ADLs.   Baseline:  Goal status: PARTIALLY MET  10/17/21 - Lumbar ROM improving w/o pain reported  5.  Patient will demonstrate improved B LE strength to >/= 4/5 for improved stability and ease of mobility . Baseline:  Goal status: PARTIALLY MET  10/17/21 - Refer to above MMT table  6.  Patient will report 62 on lumbar FOTO to demonstrate improved functional ability.  Baseline: 28 Goal status: IN PROGRESS   7.  Patient will tolerate 20-30 min of sitting w/o increased pain to improve comfort with eating meals or driving. Baseline:  Goal status: IN PROGRESS   PLAN: PT FREQUENCY: 2x/week  PT DURATION: 8 weeks  PLANNED INTERVENTIONS: Therapeutic exercises, Therapeutic activity, Neuromuscular re-education, Balance training, Gait training, Patient/Family education, Self Care, Joint mobilization, Stair training, Aquatic Therapy, Dry Needling, Electrical stimulation, Spinal mobilization, Cryotherapy, Moist heat, Taping, Traction, Ultrasound, Ionotophoresis 44m/ml Dexamethasone, Manual therapy, and Re-evaluation.  PLAN FOR NEXT SESSION:  gently progress lumbopelvic  flexibility and strengthening - update HEP as indicated; MT +/- DN to address pain and abnormal muscle tension; modalities PRN for pain; formal re-eval for neck per pt preference; review of posture and body mechanics education PRN   JPercival Spanish PT 10/20/2021, 3:13 PM

## 2021-10-20 NOTE — Telephone Encounter (Signed)
Pt came in office stating from her last visit provider was going to put a referral for Physical therapy for her neck issue, pt would like to have the referral put in, and also pt wanted to know if she can get her labs done a wk before having her cpe in February (no orders on file). Please advise.

## 2021-10-21 ENCOUNTER — Other Ambulatory Visit: Payer: Self-pay

## 2021-10-21 DIAGNOSIS — E559 Vitamin D deficiency, unspecified: Secondary | ICD-10-CM

## 2021-10-21 DIAGNOSIS — E119 Type 2 diabetes mellitus without complications: Secondary | ICD-10-CM

## 2021-10-21 DIAGNOSIS — I10 Essential (primary) hypertension: Secondary | ICD-10-CM

## 2021-10-21 DIAGNOSIS — E669 Obesity, unspecified: Secondary | ICD-10-CM

## 2021-10-21 DIAGNOSIS — E7800 Pure hypercholesterolemia, unspecified: Secondary | ICD-10-CM

## 2021-10-21 NOTE — Telephone Encounter (Signed)
Called pt Lvm of labs order future and Referral is in

## 2021-10-24 ENCOUNTER — Ambulatory Visit: Payer: Medicare Other | Admitting: Physical Therapy

## 2021-10-24 ENCOUNTER — Other Ambulatory Visit: Payer: Medicare Other

## 2021-10-24 ENCOUNTER — Encounter: Payer: Self-pay | Admitting: Physical Therapy

## 2021-10-24 DIAGNOSIS — M62830 Muscle spasm of back: Secondary | ICD-10-CM | POA: Diagnosis not present

## 2021-10-24 DIAGNOSIS — M5459 Other low back pain: Secondary | ICD-10-CM | POA: Diagnosis not present

## 2021-10-24 DIAGNOSIS — M542 Cervicalgia: Secondary | ICD-10-CM | POA: Diagnosis not present

## 2021-10-24 DIAGNOSIS — M6281 Muscle weakness (generalized): Secondary | ICD-10-CM | POA: Diagnosis not present

## 2021-10-24 DIAGNOSIS — R2689 Other abnormalities of gait and mobility: Secondary | ICD-10-CM

## 2021-10-24 DIAGNOSIS — M5412 Radiculopathy, cervical region: Secondary | ICD-10-CM | POA: Diagnosis not present

## 2021-10-24 DIAGNOSIS — R252 Cramp and spasm: Secondary | ICD-10-CM | POA: Diagnosis not present

## 2021-10-24 DIAGNOSIS — R262 Difficulty in walking, not elsewhere classified: Secondary | ICD-10-CM

## 2021-10-24 DIAGNOSIS — R293 Abnormal posture: Secondary | ICD-10-CM | POA: Diagnosis not present

## 2021-10-24 NOTE — Therapy (Signed)
OUTPATIENT PHYSICAL THERAPY TREATMENT   Patient Name: [name] MRN: [id number] DOB:[birth year], 81 y.o., female Today's Date: 10/24/2021     PT End of Session - 10/24/21 1315     Visit Number 9    Date for PT Re-Evaluation 11/16/21    Authorization Type Medicare    Progress Note Due on Visit 63   MD PN on visit #7 - 10/17/21   PT Start Time 1315    PT Stop Time 1400    PT Time Calculation (min) 45 min    Activity Tolerance Patient tolerated treatment well    Behavior During Therapy [facility] for tasks assessed/performed                     Past Medical History:  Diagnosis Date   Abdominal pain in female 03/18/2010   Qualifier: Diagnosis of  By: Carlean Purl MD, Tonna Boehringer E    Anemia 06/08/2014   Anxiety    Arthritis    Spinal Osteoarthritis   Cancer ([place])    Carcinoid tumor of stomach    Cataract    Chest pain    Myoview 12/15 no ischemia.   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 ([place]) 09/05/2006   Qualifier: Diagnosis of  By: Marca Ancona RMA, Lucy     Diabetic peripheral neuropathy ([place]) 10/29/2013   Encounter for preventative adult health care exam with abnormal findings 09/14/2013   Esophageal reflux    Gastric polyp    Fundic Gland   Gastroparesis    Headache(784.0)    Heart murmur    Echocardiogram 2/11: EF 60-65%, mild LAE, grade 1 diastolic dysfunction, aortic valve sclerosis, mean gradient 9 mm of mercury, PASP 34   Hematuria 03/16/2016   Iron deficiency anemia, unspecified    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   Leg swelling    bilateral   Neck pain 04/22/2015   PONV (postoperative nausea and vomiting)    pt states only needs small amount of anesthesia   PSVT (paroxysmal supraventricular tachycardia) ([place])    Pure hypercholesterolemia    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   Renal insufficiency 06/18/2019   Stroke ([place])    tia, 2014   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   Type II or unspecified  type diabetes mellitus without mention of complication, not stated as uncontrolled    Unspecified essential hypertension    Unspecified hereditary and idiopathic peripheral neuropathy 10/29/2013   Past Surgical History:  Procedure Laterality Date   CHOLECYSTECTOMY  1993   COLONOSCOPY  11/11/2010   diverticulosis   DILATATION & CURRETTAGE/HYSTEROSCOPY WITH RESECTOCOPE N/A 02/25/2013   Procedure: Attempted hysteroscopy with uterine perforation;  Surgeon: Jamey Reas de Berton Lan, MD;  Location: [facility];  Service: Gynecology;  Laterality: N/A;   ESOPHAGOGASTRODUODENOSCOPY  08/29/2010; 09/15/2010   Carcinoid tumor less than 1 cm in July 2012 not seen in August 2012 , gastritis, fundic gland polyps   ESOPHAGOGASTRODUODENOSCOPY  05/16/2011   ESOPHAGOGASTRODUODENOSCOPY  06/14/2012   EUS  12/15/2010   Procedure: UPPER ENDOSCOPIC ULTRASOUND (EUS) LINEAR;  Surgeon: Owens Loffler, MD;  Location: [facility];  Service: Endoscopy;  Laterality: N/A;   EYE SURGERY Bilateral    Bi lateral cateracts and bi lateral laser   LAPAROSCOPY N/A 02/25/2013   Procedure: Cystoscopy and laparoscopy with fulguration of uterine serosa;  Surgeon: Jamey Reas de Berton Lan, MD;  Location: [facility];  Service: Gynecology;  Laterality: N/A;   TONSILLECTOMY     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   Renal insufficiency 06/18/2019   Educated about COVID-19 virus infection 05/15/2019   Atrophic vaginitis 01/20/2019   Stenosis of carotid artery 11/12/2018   Anxiety 10/13/2018   Thrush 12/07/2017   Sinusitis 10/11/2017   Headache 07/12/2017   Dizzy spells 11/21/2016   Constipation 11/21/2016   Nonrheumatic aortic  valve stenosis 05/23/2016   Aortic atherosclerosis ([facility]) 05/23/2016   Hematuria 03/16/2016   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   Pain of upper abdomen 06/06/2015   Neck pain 04/22/2015   Ear pain 02/28/2015   Abnormal urine 11/21/2014   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   Anemia 06/08/2014   RLS (restless legs syndrome) 11/23/2013   Diabetic peripheral neuropathy ([place]) 10/29/2013   Left-sided thoracic back pain 10/06/2013   Encounter for preventative adult health care exam with abnormal findings 09/14/2013   Iron deficiency anemia    Status post laparoscopy 02/25/2013   Hyponatremia 01/09/2013   GERD (gastroesophageal reflux disease) 01/09/2013   Amaurosis fugax of left eye 10/16/2012   Low back pain 06/03/2012   Vitamin D deficiency 03/11/2012   Bilateral hand pain 10/20/2011   Encounter for long-term (current) use of other medications 10/20/2011   IBS (irritable bowel syndrome) 08/14/2011   TIA (transient ischemic attack) 02/10/2011   Abnormal brain CT 01/19/2011   Allergic rhinitis 10/01/2010   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   FUNDIC GLAND POLYPS OF STOMACH 03/18/2010   Abdominal pain in female 03/18/2010   Pain in joint 70/62/3762   SYSTOLIC MURMUR 83/15/1761   Paroxysmal supraventricular tachycardia ([place]) 01/12/2009   PLANTAR FASCIITIS 06/08/2008   CHEST PAIN 05/18/2008   Gastroparesis 12/18/2007   HYPERCHOLESTEROLEMIA 06/11/2007   Diabetes mellitus type 2 in obese ([place]) 09/05/2006   Essential hypertension 09/05/2006    PCP: [name], MD  REFERRING PROVIDER: Marrian Salvage, FNP  REFERRING DIAG: [phone] (ICD-10-CM) - Chronic right-sided low back pain with right-sided sciatica  RATIONALE FOR EVALUATION AND TREATMENT: Rehabilitation  THERAPY DIAG:  Other low back pain  Muscle spasm of back  Muscle weakness (generalized)  Difficulty in walking, not elsewhere classified  Other abnormalities of gait and  mobility  ONSET DATE: acute on chronic exacerbation ~3 weeks ago  NEXT MD VISIT: 03/28/22 with PCP   SUBJECTIVE:  SUBJECTIVE STATEMENT: Pt states "I want to be stronger". New referral received from her PCP to eval and treat her for neck pain - pt wants to continue the focus on strengthening for her back this week and defer the neck eval for now.  PAIN:  Are you having pain? Yes: NPRS scale:  4/10 Pain location: R>L low back with R LE radicular pain and burning sensation Pain description: burning, numbness Aggravating factors: prolonged sitting, bending forward, driving, prolonged standing or walking Relieving factors: heat, hot shower, topical pain cream  Are you having pain? Yes: NPRS scale:  7/10 Pain location: Neck - L upper & R lower Pain description: sharp Aggravating factors: sleeping on L side Relieving factors: chin tuck exercises   PERTINENT HISTORY: Extensive PMH including chronic back and neck pain, OA, osteoporosis, DM, HTN, h/o TIA, diabetic peripheral neuropathy, PVD, headaches, sleep dysfunction, anxiety (refer to problem list and past medical/surgical history for full PMH)  PRECAUTIONS: None  WEIGHT BEARING RESTRICTIONS: No  FALLS:  Has patient fallen in last 6 months? No  LIVING ENVIRONMENT: Lives with: lives alone Lives in: House/apartment Stairs: No Has following equipment at home: Single point cane, Environmental consultant - 4 wheeled, shower chair, and Grab bars  OCCUPATION: Retired  PLOF: Independent and Leisure: read and play card games on her phone, has been doing her prior PT HEP recently qod since pain started up  PATIENT GOALS: "No pain."   OBJECTIVE:   DIAGNOSTIC FINDINGS:  05/25/20 - Lumbar x-ray:  FINDINGS: Decreased osseous mineralization. Stable lumbar vertebral body  heights and alignment. Mild disc space narrowing is similar. Small endplate osteophytes are present at L2-L3 and L3-L4. Mild facet hypertrophy. Aortic calcification. IMPRESSION: Mild lumbar spondylosis similar to prior study (2018).  PATIENT SURVEYS:  FOTO Lumbar = 28; predicted = 46  SCREENING FOR RED FLAGS: Bowel or bladder incontinence: No Spinal tumors: No Cauda equina syndrome: No Compression fracture: No Abdominal aneurysm: No  COGNITION:  Overall cognitive status: Within functional limits for tasks assessed     SENSATION: Light touch: Impaired  numbness in B feet  MUSCLE LENGTH: Hamstrings: mod tight R>L ITB: mod tight R>L Piriformis: mod tight B Hip flexors: NT Quads: NT Heelcord: NT  POSTURE:  rounded shoulders, forward head, decreased lumbar lordosis, and increased thoracic kyphosis  PALPATION: Increased muscle tension and TTP over lumbar paraspinals and R glutes/piriformis and lateral hip  LUMBAR ROM:   Active  AROM  Eval - 09/21/21  10/17/21  Flexion Hands level with knees Hands to mid shins  Extension 90% limited 75% limited  Right lateral flexion Hand to mid thigh Hand to lateral knee  Left lateral flexion Hand to mid thigh Hand to lateral knee  Right rotation 80% limited 50% limited  Left rotation 80% limited 50% limited    LOWER EXTREMITY ROM:     Mildly limited primarily due to muscle tightness  LOWER EXTREMITY MMT:    MMT Right eval Left eval Right 10/17/21 Left  10/17/21  Hip flexion 3+ 3+ 3+ 3+  Hip extension 3- 3- 3 3-  Hip abduction 4- 3+ 4 4-  Hip adduction 3+ 3+ 3+ 3+  Hip internal rotation 3+ 3+ 4+ 4+  Hip external rotation 3+ 3+ 4- 4-  Knee flexion 4- 4- 4+ 4+  Knee extension 4- 4- 4 4  Ankle dorsiflexion 3+ 3+ 4- 4-  Ankle plantarflexion 3- 3-    Ankle inversion      Ankle eversion       (Blank  rows = not tested)  LUMBAR SPECIAL TESTS:  Straight leg raise test: Positive on Right   TODAY'S  TREATMENT:  10/24/21 THERAPEUTIC EXERCISE: to improve flexibility, strength and mobility.  Verbal and tactile cues throughout for technique.  NuStep - L6 x 7 min (UE/LE) Standing alt hip abduction x 10 with looped YTB at ankles; UE support on back of chair Standing alt hip extension x 10 with looped YTB at ankles; UE support on back of chair Standing alt hip flexion march x 10 with looped YTB at midfeet; UE support on back of chair Squat touch to mat table x 10, UE support on back of chair Heel raises + quad/glute/abdominal sets 10 x 3"; UE support on back of chair R/L fwd step-ups to 6" step x 10 each leg B lateral step-ups to 6" step x 10 R/L 6" step up/over x 10   10/20/21 THERAPEUTIC EXERCISE: to improve flexibility, strength and mobility.  Verbal and tactile cues throughout for technique.  NuStep - L5 x 6 min (UE/LE) Heel raises + quad/glute/abdominal sets 10 x 3"; UE support on back of chair Standing alt hip abduction x 10; UE support on back of chair Standing alt hip extension x 10; UE support on back of chair Standing alt hip flexion march x 10; UE support on back of chair Seated TrA + hip adduction ball squeeze isometric 10 x 5"; feet resting on 4" step for all seated exercises to ensure 90/90 hip and knee alignment Seated TrA + RTB alt hip ABD/ER unilateral clam 10 x 3" Seated TrA + RTB rows 10 x 3" Seated TrA + RTB scap retraction/shoulder extension 10 x 3" Seated TrA + RTB pallof press 10 x 3"   10/17/21 THERAPEUTIC EXERCISE: to improve flexibility, strength and mobility.  Verbal and tactile cues throughout for technique.  NuStep - L5 x 6 min (UE/LE) R/L sidelying clam 2 x 10 Brace marching 2 x 10  THERAPEUTIC ACTIVITIES: MMT Goal assessment  MANUAL THERAPY: To promote normalized muscle tension, improved flexibility, and reduced pain. STM/DTM and manual TPR to R glute medius/minimus and piriformis   PATIENT EDUCATION:  Education details: HEP update Person  educated: Patient Education method: Explanation, Demonstration, Tactile cues, Verbal cues, and Handouts Education comprehension: verbalized understanding, returned demonstration, verbal cues required, tactile cues required, and needs further education   HOME EXERCISE PROGRAM: Access Code: [email or web address] URL: [email or web address] Date: 10/24/2021 Prepared by: Annie Paras  Exercises - Hooklying Single Knee to Chest Stretch  - 2-3 x daily - 7 x weekly - 3 reps - 30 sec hold - Supine Piriformis Stretch with Foot on Ground  - 2-3 x daily - 7 x weekly - 3 reps - 30 sec hold - Supine Lower Trunk Rotation  - 2-3 x daily - 7 x weekly - 5 reps - 10 sec hold - Supine Posterior Pelvic Tilt  - 2-3 x daily - 7 x weekly - 2 sets - 10 reps - 5 sec hold - Supine Transversus Abdominis Bracing with Leg Extension  - 1 x daily - 7 x weekly - 2 sets - 10 reps - 3 sec hold - Seated Hamstring Stretch  - 2-3 x daily - 7 x weekly - 3 reps - 30 sec hold - Seated Flexion Stretch with Swiss Ball  - 2-3 x daily - 7 x weekly - 3 reps - 30 sec hold - Seated Thoracic Flexion and Rotation with Swiss Ball  - 2-3 x daily - 7 x weekly - 2-3  reps - 30 sec hold - Modified Thomas Stretch (Mirrored)  - 2-3 x daily - 3 x weekly - 3 reps - 30 sec hold - Hooklying Single Leg Bent Knee Fallouts with Resistance  - 1 x daily - 3 x weekly - 2 sets - 10 reps - 3 sec hold - Supine March with Resistance Band  - 1 x daily - 3 x weekly - 2 sets - 10 reps - 2-3 sec hold hold - Bridge with Resistance  - 1 x daily - 3 x weekly - 2 sets - 10 reps - 5 sec hold - Standing Lumbar Extension with Counter  - 1 x daily - 7 x weekly - 2 sets - 10 reps - 3 sec hold - Beginner Clam  - 1 x daily - 3 x weekly - 2 sets - 10 reps - 3 sec hold - Standing Hip Abduction with Resistance at Ankles and Counter Support  - 1 x daily - 3 x weekly - 2 sets - 10 reps - 3 sec hold - Standing Hip Extension with Resistance at Ankles and Counter Support  - 1 x  daily - 3 x weekly - 2 sets - 10 reps - 3 sec hold - Marching with Resistance  - 1 x daily - 3 x weekly - 2 sets - 10 reps - 3 sec hold - Squat with Chair and Counter Support  - 1 x daily - 3 x weekly - 2 sets - 10 reps - 3 sec hold - Heel Raises with Counter Support  - 1 x daily - 3 x weekly - 2 sets - 10 reps - 3 sec hold  Patient Education - Posture and Body Mechanics   ASSESSMENT:  CLINICAL IMPRESSION: New referral received from PCP to address pt's c/o neck pain, but she requested to defer this eval for now to allow her to continue to focus on building her strength in her LEs to help her back. Continued strengthening progression mostly in standing focusing on postural awareness and incorporating functional movement patterns with good tolerance - HEP updated to reflect exercise progression with pt encouraged to alternate standing exercises on opposite days from supine or seated exercises.   OBJECTIVE IMPAIRMENTS: decreased activity tolerance, decreased endurance, decreased knowledge of condition, decreased mobility, difficulty walking, decreased ROM, decreased strength, hypomobility, increased fascial restrictions, impaired perceived functional ability, increased muscle spasms, impaired flexibility, impaired sensation, improper body mechanics, postural dysfunction, and pain.   ACTIVITY LIMITATIONS: carrying, lifting, bending, sitting, standing, squatting, sleeping, stairs, transfers, bed mobility, bathing, toileting, dressing, reach over head, and locomotion level  PARTICIPATION LIMITATIONS: meal prep, cleaning, laundry, driving, shopping, community activity, and yard work  PERSONAL FACTORS: Age, Fitness, Past/current experiences, Time since onset of injury/illness/exacerbation, and 3+ comorbidities: Extensive PMH including chronic back and neck pain, OA, osteoporosis, DM, HTN, h/o TIA, diabetic peripheral neuropathy, PVD, headaches, sleep dysfunction, anxiety (refer to problem list and past  medical/surgical history for full PMH)  are also affecting patient's functional outcome.   REHAB POTENTIAL: Good  CLINICAL DECISION MAKING: Evolving/moderate complexity  EVALUATION COMPLEXITY: Moderate   GOALS: Goals reviewed with patient? Yes  SHORT TERM GOALS: Target date: 10/19/2021   Patient will be independent with initial HEP to improve outcomes and carryover.  Baseline:  Goal status: MET  10/17/21  2.  Patient will report centralization of radicular symptoms.  Baseline:  Goal status: IN PROGRESS  10/17/21 - Pt reports radicular pain now only intermittent and less intense when present - mostly  in R thigh but still occasionally to calf  LONG TERM GOALS: Target date: 11/16/2021    Patient will be independent with ongoing/advanced HEP for self-management at home.  Baseline:  Goal status: IN PROGRESS   2.  Patient will report 50-75% improvement in low back pain to improve QOL.  Baseline:  Goal status: MET  10/17/21 - Pt reports 70% reduction in pain  3.  Patient to demonstrate ability to achieve and maintain good spinal alignment/posturing and body mechanics needed for daily activities. Baseline:  Goal status: IN PROGRESS  10/17/21 - education provided last visit and pt denies any questions/concerns  4.  Patient will demonstrate functional pain free lumbar ROM to perform ADLs.   Baseline:  Goal status: PARTIALLY MET  10/17/21 - Lumbar ROM improving w/o pain reported  5.  Patient will demonstrate improved B LE strength to >/= 4/5 for improved stability and ease of mobility . Baseline:  Goal status: PARTIALLY MET  10/17/21 - Refer to above MMT table  6.  Patient will report 23 on lumbar FOTO to demonstrate improved functional ability.  Baseline: 28 Goal status: IN PROGRESS   7.  Patient will tolerate 20-30 min of sitting w/o increased pain to improve comfort with eating meals or driving. Baseline:  Goal status: IN PROGRESS   PLAN: PT FREQUENCY: 2x/week  PT DURATION: 8  weeks  PLANNED INTERVENTIONS: Therapeutic exercises, Therapeutic activity, Neuromuscular re-education, Balance training, Gait training, Patient/Family education, Self Care, Joint mobilization, Stair training, Aquatic Therapy, Dry Needling, Electrical stimulation, Spinal mobilization, Cryotherapy, Moist heat, Taping, Traction, Ultrasound, Ionotophoresis 84m/ml Dexamethasone, Manual therapy, and Re-evaluation.  PLAN FOR NEXT SESSION:  formal re-eval for neck per pt preference; gently progress lumbopelvic flexibility and strengthening - update HEP as indicated; MT +/- DN to address pain and abnormal muscle tension; modalities PRN for pain; review of posture and body mechanics education PRN   JPercival Spanish PT 10/24/2021, 8:10 PM

## 2021-10-27 ENCOUNTER — Ambulatory Visit: Payer: Medicare Other | Admitting: Physical Therapy

## 2021-10-31 ENCOUNTER — Encounter: Payer: Medicare Other | Admitting: Physical Therapy

## 2021-11-01 ENCOUNTER — Encounter: Payer: Self-pay | Admitting: Physical Therapy

## 2021-11-01 ENCOUNTER — Ambulatory Visit: Payer: Medicare Other | Admitting: Physical Therapy

## 2021-11-01 ENCOUNTER — Ambulatory Visit: Payer: Medicare Other | Attending: Family Medicine | Admitting: Physical Therapy

## 2021-11-01 DIAGNOSIS — M6281 Muscle weakness (generalized): Secondary | ICD-10-CM | POA: Insufficient documentation

## 2021-11-01 DIAGNOSIS — R2689 Other abnormalities of gait and mobility: Secondary | ICD-10-CM | POA: Diagnosis not present

## 2021-11-01 DIAGNOSIS — R262 Difficulty in walking, not elsewhere classified: Secondary | ICD-10-CM | POA: Diagnosis not present

## 2021-11-01 DIAGNOSIS — M5412 Radiculopathy, cervical region: Secondary | ICD-10-CM | POA: Diagnosis not present

## 2021-11-01 DIAGNOSIS — M62830 Muscle spasm of back: Secondary | ICD-10-CM | POA: Insufficient documentation

## 2021-11-01 DIAGNOSIS — R252 Cramp and spasm: Secondary | ICD-10-CM | POA: Diagnosis not present

## 2021-11-01 DIAGNOSIS — R293 Abnormal posture: Secondary | ICD-10-CM | POA: Insufficient documentation

## 2021-11-01 DIAGNOSIS — M542 Cervicalgia: Secondary | ICD-10-CM | POA: Insufficient documentation

## 2021-11-01 DIAGNOSIS — M5459 Other low back pain: Secondary | ICD-10-CM | POA: Diagnosis not present

## 2021-11-01 NOTE — Therapy (Signed)
OUTPATIENT PHYSICAL THERAPY RE-EVALUATION AND TREATMENT   Patient Name: [name] MRN: [id number] DOB:06-Jan-1941, 81 y.o., female Today's Date: 11/01/2021     PT End of Session - 11/01/21 1439     Visit Number 10    Date for PT Re-Evaluation 12/13/21    Authorization Type Medicare    Progress Note Due on Visit 20   Re-Eval for neck on visit #10 - 11/01/21   PT Start Time 1439    PT Stop Time 1534    PT Time Calculation (min) 55 min    Activity Tolerance Patient tolerated treatment well    Behavior During Therapy [facility] for tasks assessed/performed                 Past Medical History:  Diagnosis Date   Abdominal pain in female 03/18/2010   Qualifier: Diagnosis of  By: Carlean Purl MD, Tonna Boehringer E    Anemia 06/08/2014   Anxiety    Arthritis    Spinal Osteoarthritis   Cancer ([place])    Carcinoid tumor of stomach    Cataract    Chest pain    Myoview 12/15 no ischemia.   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 ([place]) 09/05/2006   Qualifier: Diagnosis of  By: Marca Ancona RMA, Lucy     Diabetic peripheral neuropathy ([place]) 10/29/2013   Encounter for preventative adult health care exam with abnormal findings 09/14/2013   Esophageal reflux    Gastric polyp    Fundic Gland   Gastroparesis    Headache(784.0)    Heart murmur    Echocardiogram 2/11: EF 60-65%, mild LAE, grade 1 diastolic dysfunction, aortic valve sclerosis, mean gradient 9 mm of mercury, PASP 34   Hematuria 03/16/2016   Iron deficiency anemia, unspecified    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   Leg swelling    bilateral   Neck pain 04/22/2015   PONV (postoperative nausea and vomiting)    pt states only needs small amount of anesthesia   PSVT (paroxysmal supraventricular tachycardia) ([place])    Pure hypercholesterolemia    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   Renal insufficiency 06/18/2019   Stroke ([place])    tia, 2014   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    Type II or unspecified type diabetes mellitus without mention of complication, not stated as uncontrolled    Unspecified essential hypertension    Unspecified hereditary and idiopathic peripheral neuropathy 10/29/2013   Past Surgical History:  Procedure Laterality Date   CHOLECYSTECTOMY  1993   COLONOSCOPY  11/11/2010   diverticulosis   DILATATION & CURRETTAGE/HYSTEROSCOPY WITH RESECTOCOPE N/A 02/25/2013   Procedure: Attempted hysteroscopy with uterine perforation;  Surgeon: Jamey Reas de Berton Lan, MD;  Location: [facility];  Service: Gynecology;  Laterality: N/A;   ESOPHAGOGASTRODUODENOSCOPY  08/29/2010; 09/15/2010   Carcinoid tumor less than 1 cm in July 2012 not seen in August 2012 , gastritis, fundic gland polyps   ESOPHAGOGASTRODUODENOSCOPY  05/16/2011   ESOPHAGOGASTRODUODENOSCOPY  06/14/2012   EUS  12/15/2010   Procedure: UPPER ENDOSCOPIC ULTRASOUND (EUS) LINEAR;  Surgeon: Owens Loffler, MD;  Location: [facility];  Service: Endoscopy;  Laterality: N/A;   EYE SURGERY Bilateral    Bi lateral cateracts and bi lateral laser   LAPAROSCOPY N/A 02/25/2013   Procedure: Cystoscopy and laparoscopy with fulguration of uterine serosa;  Surgeon: Jamey Reas de Berton Lan, MD;  Location: [facility];  Service: Gynecology;  Laterality: N/A;   TONSILLECTOMY     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   Renal insufficiency 06/18/2019   Educated about COVID-19 virus infection 05/15/2019   Atrophic vaginitis 01/20/2019   Stenosis of carotid artery 11/12/2018   Anxiety 10/13/2018   Thrush 12/07/2017   Sinusitis 10/11/2017   Headache 07/12/2017   Dizzy spells 11/21/2016   Constipation 11/21/2016    Nonrheumatic aortic valve stenosis 05/23/2016   Aortic atherosclerosis ([facility]) 05/23/2016   Hematuria 03/16/2016   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   Pain of upper abdomen 06/06/2015   Neck pain 04/22/2015   Ear pain 02/28/2015   Abnormal urine 11/21/2014   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   Anemia 06/08/2014   RLS (restless legs syndrome) 11/23/2013   Diabetic peripheral neuropathy ([place]) 10/29/2013   Left-sided thoracic back pain 10/06/2013   Encounter for preventative adult health care exam with abnormal findings 09/14/2013   Iron deficiency anemia    Status post laparoscopy 02/25/2013   Hyponatremia 01/09/2013   GERD (gastroesophageal reflux disease) 01/09/2013   Amaurosis fugax of left eye 10/16/2012   Low back pain 06/03/2012   Vitamin D deficiency 03/11/2012   Bilateral hand pain 10/20/2011   Encounter for long-term (current) use of other medications 10/20/2011   IBS (irritable bowel syndrome) 08/14/2011   TIA (transient ischemic attack) 02/10/2011   Abnormal brain CT 01/19/2011   Allergic rhinitis 10/01/2010   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   FUNDIC GLAND POLYPS OF STOMACH 03/18/2010   Abdominal pain in female 03/18/2010   Pain in joint 85/88/5027   SYSTOLIC MURMUR 74/01/8785   Paroxysmal supraventricular tachycardia ([place]) 01/12/2009   PLANTAR FASCIITIS 06/08/2008   CHEST PAIN 05/18/2008   Gastroparesis 12/18/2007   HYPERCHOLESTEROLEMIA 06/11/2007   Diabetes mellitus type 2 in obese ([place]) 09/05/2006   Essential hypertension 09/05/2006    PCP: [name], MD  REFERRING PROVIDER: Marrian Salvage, FNP (low back pain) & [name], MD (neck pain)  REFERRING DIAG:  M54.41,G89.29 (ICD-10-CM) - Chronic right-sided low back pain with right-sided sciatica M54.2 (ICD-10-CM) - Neck pain  RATIONALE FOR EVALUATION AND TREATMENT: Rehabilitation  THERAPY DIAG:  Cervicalgia  Radiculopathy, cervical region  Abnormal  posture  Cramp and spasm  Other low back pain  Muscle spasm of back  Muscle weakness (generalized)  Difficulty in walking, not elsewhere classified  Other abnormalities of gait and mobility  ONSET DATE: acute on chronic exacerbation ~3 weeks ago; neck pain - chronic   NEXT MD VISIT: 03/28/22 with PCP   SUBJECTIVE:  SUBJECTIVE STATEMENT: New referral received from PCP to address her chronic neck pain. She states her nack pain has been going on "forever" and has been bad enough at times to cause her to go to the ER. She states the L side of her head is often numb. She reports intermittent headaches w/o focal pain pattern. She also notes radicular burning in B shoulders as well as intermittent numbness in L UE.She is unable to sleep on her L side due to neck pain. She has been told she has partial fusion across the C2-C3 disc space and pinched nerves based on a previous MRI.  PAIN:  Are you having pain? Yes: NPRS scale:  6/10 Pain location: Neck - full L side and base of neck on R Pain description: stabbing Aggravating factors: looking down, sleeping on her side, lifting/carrying groceries Relieving factors: chin tuck exercises, neck stretches, heating pad  Are you having pain? Yes: NPRS scale:  2/10 Pain location: R>L low back with R LE radicular pain and burning sensation Pain description: burning, numbness  PERTINENT HISTORY: Extensive PMH including chronic back and neck pain, OA, osteoporosis, DM, HTN, h/o TIA, diabetic peripheral neuropathy, PVD, headaches, sleep dysfunction, anxiety (refer to problem list and past medical/surgical history for full PMH)  PRECAUTIONS: None  WEIGHT BEARING RESTRICTIONS: No  FALLS:  Has patient fallen in last 6 months? No  LIVING ENVIRONMENT: Lives with:  lives alone Lives in: House/apartment Stairs: No Has following equipment at home: Single point cane, Environmental consultant - 4 wheeled, shower chair, and Grab bars  OCCUPATION: Retired  PLOF: Independent and Leisure: read and play card games on her phone, has been doing her prior PT HEP recently qod since pain started up  PATIENT GOALS: "No pain."   OBJECTIVE:   DIAGNOSTIC FINDINGS:  05/25/20 - Lumbar x-ray:  FINDINGS: Decreased osseous mineralization. Stable lumbar vertebral body heights and alignment. Mild disc space narrowing is similar. Small endplate osteophytes are present at L2-L3 and L3-L4. Mild facet hypertrophy. Aortic calcification. IMPRESSION: Mild lumbar spondylosis similar to prior study (2018).  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 Moderate foraminal stenosis on the left at C4-C5. Mild-to-moderate bilateral foraminal stenosis at C5-C6 and mild foraminal stenosis on the right at C3-C4 and C4-C5. Mild to moderate canal stenosis at C3-C4 and C4-C5. Mild canal stenosis at C5-C6.  PATIENT SURVEYS:  FOTO Lumbar = 28; predicted = 46  SCREENING FOR RED FLAGS: Bowel or bladder incontinence: No Spinal tumors: No Cauda equina syndrome: No Compression fracture: No Abdominal aneurysm: No  COGNITION:  Overall cognitive status: Within functional limits for tasks assessed     SENSATION: Light touch: Impaired  numbness in B feet  MUSCLE LENGTH: Hamstrings: mod tight R>L ITB: mod tight R>L Piriformis: mod tight B Hip flexors: NT Quads: NT Heelcord: NT  POSTURE:  rounded shoulders, forward head, decreased lumbar lordosis, and increased thoracic kyphosis  PALPATION: Increased muscle tension and TTP over lumbar paraspinals and R glutes/piriformis and lateral hip  LUMBAR ROM:   Active  AROM  Eval - 09/21/21  10/17/21  Flexion Hands level with knees Hands to mid shins  Extension 90% limited 75% limited  Right lateral flexion Hand to  mid thigh Hand to lateral knee  Left lateral flexion Hand to mid thigh Hand to lateral knee  Right rotation 80% limited 50% limited  Left rotation 80% limited 50% limited    LOWER EXTREMITY ROM:     Mildly limited primarily due to  muscle tightness  LOWER EXTREMITY MMT:    MMT Right eval Left eval Right 10/17/21 Left  10/17/21  Hip flexion 3+ 3+ 3+ 3+  Hip extension 3- 3- 3 3-  Hip abduction 4- 3+ 4 4-  Hip adduction 3+ 3+ 3+ 3+  Hip internal rotation 3+ 3+ 4+ 4+  Hip external rotation 3+ 3+ 4- 4-  Knee flexion 4- 4- 4+ 4+  Knee extension 4- 4- 4 4  Ankle dorsiflexion 3+ 3+ 4- 4-  Ankle plantarflexion 3- 3-    Ankle inversion      Ankle eversion       (Blank rows = not tested)  LUMBAR SPECIAL TESTS:  Straight leg raise test: Positive on Right  CERVICAL ROM:   Active ROM AROM (deg) Eval 11/01/21  Flexion 29 - pressure  Extension 34 - pain  Right lateral flexion 20 -pain  Left lateral flexion 22  Right rotation 48  Left rotation 49 - pain   (Blank rows = not tested)  UPPER EXTREMITY ROM:  Active ROM Right Eval 11/01/21 Left Eval 11/01/21  Shoulder flexion 126 120  Shoulder extension    Shoulder abduction 109 104  Shoulder adduction    Shoulder internal rotation FIR to T12 FIR to T10  Shoulder external rotation FER to T1 FER to T1  Elbow flexion    Elbow extension    Wrist flexion    Wrist extension    Wrist ulnar deviation    Wrist radial deviation    Wrist pronation    Wrist supination     (Blank rows = not tested)  UPPER EXTREMITY MMT:  MMT Right Eval 11/01/21 Left Eval 11/01/21  Shoulder flexion 4 4-  Shoulder extension 4- 3+  Shoulder abduction 4 4-  Shoulder adduction    Shoulder internal rotation 4- 4-  Shoulder external rotation 4- 3+  Middle trapezius    Lower trapezius    Elbow flexion    Elbow extension    Wrist flexion    Wrist extension    Wrist ulnar deviation    Wrist radial deviation    Wrist pronation    Wrist supination     Grip strength     (Blank rows = not tested)  CERVICAL SPECIAL TESTS:  Spurling's test: Negative and Distraction test: Positive   TODAY'S TREATMENT:  11/01/21 Re-Eval for neck pain - findings as above and under MT  MANUAL THERAPY: To promote normalized muscle tension, improved flexibility, improved joint mobility, increased ROM, and reduced pain. Cervical PROM slightly better than AROM for flexion and B side-bending but extension and B rotation essentially equivalent to AROM Gentle cervical distraction - increases pain with no change in radicular symptoms No change in pain with axial loading B suboccipital release - some relief noted on R but no change on L  THERAPEUTIC EXERCISE: to improve flexibility, strength and mobility.  Verbal and tactile cues throughout for technique. Cervical retraction 10 x 5" with & w/o 2-finger guide L/R seated UT stretch 3 x 30" L/R seated UT stretch 3 x 30" Seated scap retraction + depression 10 x 3-5"   10/24/21 THERAPEUTIC EXERCISE: to improve flexibility, strength and mobility.  Verbal and tactile cues throughout for technique.  NuStep - L6 x 7 min (UE/LE) Standing alt hip abduction x 10 with looped YTB at ankles; UE support on back of chair Standing alt hip extension x 10 with looped YTB at ankles; UE support on back of chair Standing alt hip flexion march x  10 with looped YTB at midfeet; UE support on back of chair Squat touch to mat table x 10, UE support on back of chair Heel raises + quad/glute/abdominal sets 10 x 3"; UE support on back of chair R/L fwd step-ups to 6" step x 10 each leg B lateral step-ups to 6" step x 10 R/L 6" step up/over x 10   10/20/21 THERAPEUTIC EXERCISE: to improve flexibility, strength and mobility.  Verbal and tactile cues throughout for technique.  NuStep - L5 x 6 min (UE/LE) Heel raises + quad/glute/abdominal sets 10 x 3"; UE support on back of chair Standing alt hip abduction x 10; UE support on back of  chair Standing alt hip extension x 10; UE support on back of chair Standing alt hip flexion march x 10; UE support on back of chair Seated TrA + hip adduction ball squeeze isometric 10 x 5"; feet resting on 4" step for all seated exercises to ensure 90/90 hip and knee alignment Seated TrA + RTB alt hip ABD/ER unilateral clam 10 x 3" Seated TrA + RTB rows 10 x 3" Seated TrA + RTB scap retraction/shoulder extension 10 x 3" Seated TrA + RTB pallof press 10 x 3"   PATIENT EDUCATION:  Education details: PT eval findings, anticipated POC, and HEP update - initial cervical HEP Person educated: Patient Education method: Explanation, Demonstration, Tactile cues, Verbal cues, and Handouts Education comprehension: verbalized understanding, returned demonstration, verbal cues required, tactile cues required, and needs further education   HOME EXERCISE PROGRAM: Access Code: [email or web address] URL: [email or web address] Date: 11/01/2021 Prepared by: Annie Paras  Exercises - Hooklying Single Knee to Chest Stretch  - 2-3 x daily - 7 x weekly - 3 reps - 30 sec hold - Supine Piriformis Stretch with Foot on Ground  - 2-3 x daily - 7 x weekly - 3 reps - 30 sec hold - Supine Lower Trunk Rotation  - 2-3 x daily - 7 x weekly - 5 reps - 10 sec hold - Supine Posterior Pelvic Tilt  - 2-3 x daily - 7 x weekly - 2 sets - 10 reps - 5 sec hold - Supine Transversus Abdominis Bracing with Leg Extension  - 1 x daily - 7 x weekly - 2 sets - 10 reps - 3 sec hold - Seated Hamstring Stretch  - 2-3 x daily - 7 x weekly - 3 reps - 30 sec hold - Seated Flexion Stretch with Swiss Ball  - 2-3 x daily - 7 x weekly - 3 reps - 30 sec hold - Seated Thoracic Flexion and Rotation with Swiss Ball  - 2-3 x daily - 7 x weekly - 2-3 reps - 30 sec hold - Modified Thomas Stretch (Mirrored)  - 2-3 x daily - 3 x weekly - 3 reps - 30 sec hold - Hooklying Single Leg Bent Knee Fallouts with Resistance  - 1 x daily - 3 x weekly - 2 sets  - 10 reps - 3 sec hold - Supine March with Resistance Band  - 1 x daily - 3 x weekly - 2 sets - 10 reps - 2-3 sec hold hold - Bridge with Resistance  - 1 x daily - 3 x weekly - 2 sets - 10 reps - 5 sec hold - Standing Lumbar Extension with Counter  - 1 x daily - 7 x weekly - 2 sets - 10 reps - 3 sec hold - Beginner Clam  - 1 x daily - 3 x weekly - 2 sets -  10 reps - 3 sec hold - Standing Hip Abduction with Resistance at Ankles and Counter Support  - 1 x daily - 3 x weekly - 2 sets - 10 reps - 3 sec hold - Standing Hip Extension with Resistance at Ankles and Counter Support  - 1 x daily - 3 x weekly - 2 sets - 10 reps - 3 sec hold - Marching with Resistance  - 1 x daily - 3 x weekly - 2 sets - 10 reps - 3 sec hold - Squat with Chair and Counter Support  - 1 x daily - 3 x weekly - 2 sets - 10 reps - 3 sec hold - Heel Raises with Counter Support  - 1 x daily - 3 x weekly - 2 sets - 10 reps - 3 sec hold - Seated Cervical Retraction  - 2 x daily - 7 x weekly - 2 sets - 10 reps - 3-5 sec hold - Seated Gentle Upper Trapezius Stretch  - 2 x daily - 7 x weekly - 3 reps - 30 sec hold - Gentle Levator Scapulae Stretch  - 2 x daily - 7 x weekly - 3 reps - 30 sec hold - Seated Scapular Retraction  - 3 x daily - 7 x weekly - 2 sets - 10 reps - 5 sec hold  Patient Education - Posture and Body Mechanics   ASSESSMENT:  CLINICAL IMPRESSION: Analee requested to proceed with the eval for her new referral from her PCP to address her neck pain today. Her neck pain is chronic in nature and she denies any recent acute exacerbation but reports her pain has been bad enough at times to cause her to go to the ER. Neck pain is bilateral but worse on the L with radicular pain into L upper shoulder and intermittent radicular numbness into L UE as well as numbness along L side of head and face.  She demonstrates a pronounced forward head and rounded shoulder posture leading to shortening of her [facility] and suboccipitals as well  as tightness in B UT and LS. She reports increased pain with distraction, most likely due to muscle tightness, with no change in her radicular symptoms as well as no change with axial compression. Pain, muscle tightness and abnormal posture limit her cervical and shoulder ROM. Significant postural and UE weakness also evident. Above deficits limit her tolerance for her preferred sleeping positions and well as activity tolerance throughout the day. Part[place]a will benefit from skilled PT to address the above deficits, in addition to her existing POC for her LBP, to improve positional and activity tolerance with decreased pain interference.  OBJECTIVE IMPAIRMENTS: decreased activity tolerance, decreased endurance, decreased knowledge of condition, decreased mobility, difficulty walking, decreased ROM, decreased strength, dizziness, hypomobility, increased fascial restrictions, impaired perceived functional ability, increased muscle spasms, impaired flexibility, impaired sensation, impaired UE functional use, improper body mechanics, postural dysfunction, and pain.   ACTIVITY LIMITATIONS: carrying, lifting, bending, sitting, standing, squatting, sleeping, stairs, transfers, bed mobility, bathing, toileting, dressing, reach over head, and locomotion level  PARTICIPATION LIMITATIONS: meal prep, cleaning, laundry, driving, shopping, community activity, and yard work  PERSONAL FACTORS: Age, Fitness, Past/current experiences, Time since onset of injury/illness/exacerbation, and 3+ comorbidities: Extensive PMH including chronic back and neck pain, OA, osteoporosis, DM, HTN, h/o TIA, diabetic peripheral neuropathy, PVD, headaches, sleep dysfunction, anxiety (refer to problem list and past medical/surgical history for full PMH)  are also affecting patient's functional outcome.   REHAB POTENTIAL: Good  CLINICAL DECISION  MAKING: Evolving/moderate complexity  EVALUATION COMPLEXITY: Moderate   GOALS: Goals reviewed  with patient? Yes  SHORT TERM GOALS: Target date: 10/19/2021   Patient will be independent with initial HEP to improve outcomes and carryover.  Baseline:  Goal status: MET  10/17/21  2.  Patient will report centralization of radicular symptoms.  Baseline:  Goal status: IN PROGRESS  10/17/21 - Pt reports radicular pain now only intermittent and less intense when present - mostly in R thigh but still occasionally to calf  LONG TERM GOALS: Target date: 11/16/2021, revised to 12/13/21 as of re-eval for neck    Patient will be independent with ongoing/advanced HEP for self-management at home.  Baseline:  Goal status: IN PROGRESS   2.  Patient will report 50-75% improvement in low back pain to improve QOL.  Baseline:  Goal status: MET  10/17/21 - Pt reports 70% reduction in pain  3.  Patient to demonstrate ability to achieve and maintain good spinal alignment/posturing and body mechanics needed for daily activities. Baseline:  Goal status: IN PROGRESS  10/17/21 - education provided last visit and pt denies any questions/concerns  4.  Patient will demonstrate functional pain free lumbar ROM to perform ADLs.   Baseline:  Goal status: PARTIALLY MET  10/17/21 - Lumbar ROM improving w/o pain reported  5.  Patient will demonstrate improved B LE strength to >/= 4/5 for improved stability and ease of mobility . Baseline:  Goal status: PARTIALLY MET  10/17/21 - Refer to above MMT table  6.  Patient will report 69 on lumbar FOTO to demonstrate improved functional ability.  Baseline: 28 Goal status: IN PROGRESS   7.  Patient will tolerate 20-30 min of sitting w/o increased pain to improve comfort with eating meals or driving. Baseline:  Goal status: IN PROGRESS  8.  Patient will report 50-75% improvement in neck pain to improve QOL.  Baseline:  Goal status: INITIAL  9  Patient will demonstrate functional pain free cervical ROM for safety with driving.  Baseline:  Goal status: INITIAL  10.   Patient will demonstrate improved posture to decrease muscle imbalance. Baseline:  Goal status: INITIAL  11. Patient will report at least 15% improvement on NDI to demonstrate improved functional ability.    Baseline: TBA Goal status: INITIAL   12. Patient to report ability to perform ADLs, household, and leisure activities without limitation due to neck pain, LOM or weakness. Baseline:  Goal status: INITIAL   PLAN: PT FREQUENCY: 2x/week  PT DURATION: 6 weeks  PLANNED INTERVENTIONS: Therapeutic exercises, Therapeutic activity, Neuromuscular re-education, Balance training, Gait training, Patient/Family education, Self Care, Joint mobilization, Stair training, Aquatic Therapy, Dry Needling, Electrical stimulation, Spinal mobilization, Cryotherapy, Moist heat, Taping, Traction, Ultrasound, Ionotophoresis 41m/ml Dexamethasone, Manual therapy, and Re-evaluation.  PLAN FOR NEXT SESSION:  gently progress postural and lumbopelvic flexibility and strengthening - update HEP as indicated; MT +/- DN to address pain and abnormal muscle tension; modalities PRN for pain; review of posture and body mechanics education PRN   JPercival Spanish PT 11/01/2021, 6:40 PM

## 2021-11-01 NOTE — Therapy (Signed)
OUTPATIENT PHYSICAL THERAPY RE-EVALUATION AND TREATMENT   Patient Name: [name] MRN: [id number] DOB:19-Jul-1940, 81 y.o., female Today's Date: 11/01/2021     PT End of Session - 11/01/21 1439     Visit Number 10    Date for PT Re-Evaluation 12/13/21    Authorization Type Medicare    Progress Note Due on Visit 20   Re-Eval for neck on visit #10 - 11/01/21   PT Start Time 1439    PT Stop Time 1534    PT Time Calculation (min) 55 min    Activity Tolerance Patient tolerated treatment well    Behavior During Therapy [facility] for tasks assessed/performed                  Past Medical History:  Diagnosis Date   Abdominal pain in female 03/18/2010   Qualifier: Diagnosis of  By: Carlean Purl MD, Tonna Boehringer E    Anemia 06/08/2014   Anxiety    Arthritis    Spinal Osteoarthritis   Cancer ([place])    Carcinoid tumor of stomach    Cataract    Chest pain    Myoview 12/15 no ischemia.   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 ([place]) 09/05/2006   Qualifier: Diagnosis of  By: Marca Ancona RMA, Lucy     Diabetic peripheral neuropathy ([place]) 10/29/2013   Encounter for preventative adult health care exam with abnormal findings 09/14/2013   Esophageal reflux    Gastric polyp    Fundic Gland   Gastroparesis    Headache(784.0)    Heart murmur    Echocardiogram 2/11: EF 60-65%, mild LAE, grade 1 diastolic dysfunction, aortic valve sclerosis, mean gradient 9 mm of mercury, PASP 34   Hematuria 03/16/2016   Iron deficiency anemia, unspecified    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   Leg swelling    bilateral   Neck pain 04/22/2015   PONV (postoperative nausea and vomiting)    pt states only needs small amount of anesthesia   PSVT (paroxysmal supraventricular tachycardia) ([place])    Pure hypercholesterolemia    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   Renal insufficiency 06/18/2019   Stroke ([place])    tia, 2014   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    Type II or unspecified type diabetes mellitus without mention of complication, not stated as uncontrolled    Unspecified essential hypertension    Unspecified hereditary and idiopathic peripheral neuropathy 10/29/2013   Past Surgical History:  Procedure Laterality Date   CHOLECYSTECTOMY  1993   COLONOSCOPY  11/11/2010   diverticulosis   DILATATION & CURRETTAGE/HYSTEROSCOPY WITH RESECTOCOPE N/A 02/25/2013   Procedure: Attempted hysteroscopy with uterine perforation;  Surgeon: Jamey Reas de Berton Lan, MD;  Location: [facility];  Service: Gynecology;  Laterality: N/A;   ESOPHAGOGASTRODUODENOSCOPY  08/29/2010; 09/15/2010   Carcinoid tumor less than 1 cm in July 2012 not seen in August 2012 , gastritis, fundic gland polyps   ESOPHAGOGASTRODUODENOSCOPY  05/16/2011   ESOPHAGOGASTRODUODENOSCOPY  06/14/2012   EUS  12/15/2010   Procedure: UPPER ENDOSCOPIC ULTRASOUND (EUS) LINEAR;  Surgeon: Owens Loffler, MD;  Location: [facility];  Service: Endoscopy;  Laterality: N/A;   EYE SURGERY Bilateral    Bi lateral cateracts and bi lateral laser   LAPAROSCOPY N/A 02/25/2013   Procedure: Cystoscopy and laparoscopy with fulguration of uterine serosa;  Surgeon: Jamey Reas de Berton Lan, MD;  Location: [facility];  Service: Gynecology;  Laterality: N/A;   TONSILLECTOMY     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   Renal insufficiency 06/18/2019   Educated about COVID-19 virus infection 05/15/2019   Atrophic vaginitis 01/20/2019   Stenosis of carotid artery 11/12/2018   Anxiety 10/13/2018   Thrush 12/07/2017   Sinusitis 10/11/2017   Headache 07/12/2017   Dizzy spells 11/21/2016   Constipation 11/21/2016    Nonrheumatic aortic valve stenosis 05/23/2016   Aortic atherosclerosis ([facility]) 05/23/2016   Hematuria 03/16/2016   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   Pain of upper abdomen 06/06/2015   Neck pain 04/22/2015   Ear pain 02/28/2015   Abnormal urine 11/21/2014   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/2014   Anemia 06/08/2014   RLS (restless legs syndrome) 11/23/2013   Diabetic peripheral neuropathy ([place]) 10/29/2013   Left-sided thoracic back pain 10/06/2013   Encounter for preventative adult health care exam with abnormal findings 09/14/2013   Iron deficiency anemia    Status post laparoscopy 02/25/2013   Hyponatremia 01/09/2013   GERD (gastroesophageal reflux disease) 01/09/2013   Amaurosis fugax of left eye 10/16/2012   Low back pain 06/03/2012   Vitamin D deficiency 03/11/2012   Bilateral hand pain 10/20/2011   Encounter for long-term (current) use of other medications 10/20/2011   IBS (irritable bowel syndrome) 08/14/2011   TIA (transient ischemic attack) 02/10/2011   Abnormal brain CT 01/19/2011   Allergic rhinitis 10/01/2010   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   FUNDIC GLAND POLYPS OF STOMACH 03/18/2010   Abdominal pain in female 03/18/2010   Pain in joint 92/90/9030   SYSTOLIC MURMUR 14/99/6924   Paroxysmal supraventricular tachycardia ([place]) 01/12/2009   PLANTAR FASCIITIS 06/08/2008   CHEST PAIN 05/18/2008   Gastroparesis 12/18/2007   HYPERCHOLESTEROLEMIA 06/11/2007   Diabetes mellitus type 2 in obese ([place]) 09/05/2006   Essential hypertension 09/05/2006    PCP: [name], MD  REFERRING PROVIDER: Marrian Salvage, FNP (low back pain) & [name], MD (neck pain)  REFERRING DIAG:  M54.41,G89.29 (ICD-10-CM) - Chronic right-sided low back pain with right-sided sciatica M54.2 (ICD-10-CM) - Neck pain  RATIONALE FOR EVALUATION AND TREATMENT: Rehabilitation  THERAPY DIAG:  Cervicalgia  Radiculopathy, cervical region  Abnormal  posture  Cramp and spasm  Other low back pain  Muscle spasm of back  Muscle weakness (generalized)  Difficulty in walking, not elsewhere classified  Other abnormalities of gait and mobility  ONSET DATE: acute on chronic exacerbation ~3 weeks ago; neck pain - chronic   NEXT MD VISIT: 03/28/22 with PCP   SUBJECTIVE:  SUBJECTIVE STATEMENT: New referral received from PCP to address her chronic neck pain. She states her nack pain has been going on "forever" and has been bad enough at times to cause her to go to the ER. She states the L side of her head is often numb. She reports intermittent headaches w/o focal pain pattern. She also notes radicular burning in B shoulders as well as intermittent numbness in L UE.She is unable to sleep on her L side due to neck pain. She has been told she has partial fusion across the C2-C3 disc space and pinched nerves based on a previous MRI.  PAIN:  Are you having pain? Yes: NPRS scale:  6/10 Pain location: Neck - full L side and base of neck on R Pain description: stabbing Aggravating factors: looking down, sleeping on her side, lifting/carrying groceries Relieving factors: chin tuck exercises, neck stretches, heating pad  Are you having pain? Yes: NPRS scale:  2/10 Pain location: R>L low back with R LE radicular pain and burning sensation Pain description: burning, numbness  PERTINENT HISTORY: Extensive PMH including chronic back and neck pain, OA, osteoporosis, DM, HTN, h/o TIA, diabetic peripheral neuropathy, PVD, headaches, sleep dysfunction, anxiety (refer to problem list and past medical/surgical history for full PMH)  PRECAUTIONS: None  WEIGHT BEARING RESTRICTIONS: No  FALLS:  Has patient fallen in last 6 months? No  LIVING ENVIRONMENT: Lives with:  lives alone Lives in: House/apartment Stairs: No Has following equipment at home: Single point cane, Environmental consultant - 4 wheeled, shower chair, and Grab bars  OCCUPATION: Retired  PLOF: Independent and Leisure: read and play card games on her phone, has been doing her prior PT HEP recently qod since pain started up  PATIENT GOALS: "No pain."   OBJECTIVE:   DIAGNOSTIC FINDINGS:  05/25/20 - Lumbar x-ray:  FINDINGS: Decreased osseous mineralization. Stable lumbar vertebral body heights and alignment. Mild disc space narrowing is similar. Small endplate osteophytes are present at L2-L3 and L3-L4. Mild facet hypertrophy. Aortic calcification. IMPRESSION: Mild lumbar spondylosis similar to prior study (2018).  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 Moderate foraminal stenosis on the left at C4-C5. Mild-to-moderate bilateral foraminal stenosis at C5-C6 and mild foraminal stenosis on the right at C3-C4 and C4-C5. Mild to moderate canal stenosis at C3-C4 and C4-C5. Mild canal stenosis at C5-C6.  PATIENT SURVEYS:  FOTO Lumbar = 28; predicted = 46  SCREENING FOR RED FLAGS: Bowel or bladder incontinence: No Spinal tumors: No Cauda equina syndrome: No Compression fracture: No Abdominal aneurysm: No  COGNITION:  Overall cognitive status: Within functional limits for tasks assessed     SENSATION: Light touch: Impaired  numbness in B feet  MUSCLE LENGTH: Hamstrings: mod tight R>L ITB: mod tight R>L Piriformis: mod tight B Hip flexors: NT Quads: NT Heelcord: NT  POSTURE:  rounded shoulders, forward head, decreased lumbar lordosis, and increased thoracic kyphosis  PALPATION: Increased muscle tension and TTP over lumbar paraspinals and R glutes/piriformis and lateral hip  LUMBAR ROM:   Active  AROM  Eval - 09/21/21  10/17/21  Flexion Hands level with knees Hands to mid shins  Extension 90% limited 75% limited  Right lateral flexion Hand to  mid thigh Hand to lateral knee  Left lateral flexion Hand to mid thigh Hand to lateral knee  Right rotation 80% limited 50% limited  Left rotation 80% limited 50% limited    LOWER EXTREMITY ROM:     Mildly limited primarily due to  muscle tightness  LOWER EXTREMITY MMT:    MMT Right eval Left eval Right 10/17/21 Left  10/17/21  Hip flexion 3+ 3+ 3+ 3+  Hip extension 3- 3- 3 3-  Hip abduction 4- 3+ 4 4-  Hip adduction 3+ 3+ 3+ 3+  Hip internal rotation 3+ 3+ 4+ 4+  Hip external rotation 3+ 3+ 4- 4-  Knee flexion 4- 4- 4+ 4+  Knee extension 4- 4- 4 4  Ankle dorsiflexion 3+ 3+ 4- 4-  Ankle plantarflexion 3- 3-    Ankle inversion      Ankle eversion       (Blank rows = not tested)  LUMBAR SPECIAL TESTS:  Straight leg raise test: Positive on Right  CERVICAL ROM:   Active ROM AROM (deg) Eval 11/01/21  Flexion 29 - pressure  Extension 34 - pain  Right lateral flexion 20 -pain  Left lateral flexion 22  Right rotation 48  Left rotation 49 - pain   (Blank rows = not tested)  UPPER EXTREMITY ROM:  Active ROM Right Eval 11/01/21 Left Eval 11/01/21  Shoulder flexion 126 120  Shoulder extension    Shoulder abduction 109 104  Shoulder adduction    Shoulder internal rotation FIR to T12 FIR to T10  Shoulder external rotation FER to T1 FER to T1  Elbow flexion    Elbow extension    Wrist flexion    Wrist extension    Wrist ulnar deviation    Wrist radial deviation    Wrist pronation    Wrist supination     (Blank rows = not tested)  UPPER EXTREMITY MMT:  MMT Right Eval 11/01/21 Left Eval 11/01/21  Shoulder flexion 4 4-  Shoulder extension 4- 3+  Shoulder abduction 4 4-  Shoulder adduction    Shoulder internal rotation 4- 4-  Shoulder external rotation 4- 3+  Middle trapezius    Lower trapezius    Elbow flexion    Elbow extension    Wrist flexion    Wrist extension    Wrist ulnar deviation    Wrist radial deviation    Wrist pronation    Wrist supination     Grip strength     (Blank rows = not tested)  CERVICAL SPECIAL TESTS:  Spurling's test: Negative and Distraction test: Positive   TODAY'S TREATMENT:  11/01/21 Re-Eval for neck pain - findings as above and under MT  MANUAL THERAPY: To promote normalized muscle tension, improved flexibility, improved joint mobility, increased ROM, and reduced pain. Cervical PROM slightly better than AROM for flexion and B side-bending but extension and B rotation essentially equivalent to AROM Gentle cervical distraction - increases pain with no change in radicular symptoms No change in pain with axial loading B suboccipital release - some relief noted on R but no change on L  THERAPEUTIC EXERCISE: to improve flexibility, strength and mobility.  Verbal and tactile cues throughout for technique. Cervical retraction 10 x 5" with & w/o 2-finger guide L/R seated UT stretch 3 x 30" L/R seated UT stretch 3 x 30" Seated scap retraction + depression 10 x 3-5"   10/24/21 THERAPEUTIC EXERCISE: to improve flexibility, strength and mobility.  Verbal and tactile cues throughout for technique.  NuStep - L6 x 7 min (UE/LE) Standing alt hip abduction x 10 with looped YTB at ankles; UE support on back of chair Standing alt hip extension x 10 with looped YTB at ankles; UE support on back of chair Standing alt hip flexion march x  10 with looped YTB at midfeet; UE support on back of chair Squat touch to mat table x 10, UE support on back of chair Heel raises + quad/glute/abdominal sets 10 x 3"; UE support on back of chair R/L fwd step-ups to 6" step x 10 each leg B lateral step-ups to 6" step x 10 R/L 6" step up/over x 10   10/20/21 THERAPEUTIC EXERCISE: to improve flexibility, strength and mobility.  Verbal and tactile cues throughout for technique.  NuStep - L5 x 6 min (UE/LE) Heel raises + quad/glute/abdominal sets 10 x 3"; UE support on back of chair Standing alt hip abduction x 10; UE support on back of  chair Standing alt hip extension x 10; UE support on back of chair Standing alt hip flexion march x 10; UE support on back of chair Seated TrA + hip adduction ball squeeze isometric 10 x 5"; feet resting on 4" step for all seated exercises to ensure 90/90 hip and knee alignment Seated TrA + RTB alt hip ABD/ER unilateral clam 10 x 3" Seated TrA + RTB rows 10 x 3" Seated TrA + RTB scap retraction/shoulder extension 10 x 3" Seated TrA + RTB pallof press 10 x 3"   PATIENT EDUCATION:  Education details: PT eval findings, anticipated POC, and HEP update - initial cervical HEP Person educated: Patient Education method: Explanation, Demonstration, Tactile cues, Verbal cues, and Handouts Education comprehension: verbalized understanding, returned demonstration, verbal cues required, tactile cues required, and needs further education   HOME EXERCISE PROGRAM: Access Code: [email or web address] URL: [email or web address] Date: 11/01/2021 Prepared by: Annie Paras  Exercises - Hooklying Single Knee to Chest Stretch  - 2-3 x daily - 7 x weekly - 3 reps - 30 sec hold - Supine Piriformis Stretch with Foot on Ground  - 2-3 x daily - 7 x weekly - 3 reps - 30 sec hold - Supine Lower Trunk Rotation  - 2-3 x daily - 7 x weekly - 5 reps - 10 sec hold - Supine Posterior Pelvic Tilt  - 2-3 x daily - 7 x weekly - 2 sets - 10 reps - 5 sec hold - Supine Transversus Abdominis Bracing with Leg Extension  - 1 x daily - 7 x weekly - 2 sets - 10 reps - 3 sec hold - Seated Hamstring Stretch  - 2-3 x daily - 7 x weekly - 3 reps - 30 sec hold - Seated Flexion Stretch with Swiss Ball  - 2-3 x daily - 7 x weekly - 3 reps - 30 sec hold - Seated Thoracic Flexion and Rotation with Swiss Ball  - 2-3 x daily - 7 x weekly - 2-3 reps - 30 sec hold - Modified Thomas Stretch (Mirrored)  - 2-3 x daily - 3 x weekly - 3 reps - 30 sec hold - Hooklying Single Leg Bent Knee Fallouts with Resistance  - 1 x daily - 3 x weekly - 2 sets  - 10 reps - 3 sec hold - Supine March with Resistance Band  - 1 x daily - 3 x weekly - 2 sets - 10 reps - 2-3 sec hold hold - Bridge with Resistance  - 1 x daily - 3 x weekly - 2 sets - 10 reps - 5 sec hold - Standing Lumbar Extension with Counter  - 1 x daily - 7 x weekly - 2 sets - 10 reps - 3 sec hold - Beginner Clam  - 1 x daily - 3 x weekly - 2 sets -  10 reps - 3 sec hold - Standing Hip Abduction with Resistance at Ankles and Counter Support  - 1 x daily - 3 x weekly - 2 sets - 10 reps - 3 sec hold - Standing Hip Extension with Resistance at Ankles and Counter Support  - 1 x daily - 3 x weekly - 2 sets - 10 reps - 3 sec hold - Marching with Resistance  - 1 x daily - 3 x weekly - 2 sets - 10 reps - 3 sec hold - Squat with Chair and Counter Support  - 1 x daily - 3 x weekly - 2 sets - 10 reps - 3 sec hold - Heel Raises with Counter Support  - 1 x daily - 3 x weekly - 2 sets - 10 reps - 3 sec hold - Seated Cervical Retraction  - 2 x daily - 7 x weekly - 2 sets - 10 reps - 3-5 sec hold - Seated Gentle Upper Trapezius Stretch  - 2 x daily - 7 x weekly - 3 reps - 30 sec hold - Gentle Levator Scapulae Stretch  - 2 x daily - 7 x weekly - 3 reps - 30 sec hold - Seated Scapular Retraction  - 3 x daily - 7 x weekly - 2 sets - 10 reps - 5 sec hold  Patient Education - Posture and Body Mechanics   ASSESSMENT:  CLINICAL IMPRESSION: [name] requested to proceed with the eval for her new referral from her PCP to address her neck pain today. Her neck pain is chronic in nature and she denies any recent acute exacerbation but reports her pain has been bad enough at times to cause her to go to the ER. Neck pain is bilateral but worse on the L with radicular pain into L upper shoulder and intermittent radicular numbness into L UE as well as numbness along L side of head and face.  She demonstrates a pronounced forward head and rounded shoulder posture leading to shortening of her [facility] and suboccipitals as well  as tightness in B UT and LS. She reports increased pain with distraction, most likely due to muscle tightness, with no change in her radicular symptoms as well as no change with axial compression. Pain, muscle tightness and abnormal posture limit her cervical and shoulder ROM. Significant postural and UE weakness also evident. Above deficits limit her tolerance for her preferred sleeping positions and well as activity tolerance throughout the day. Meggie will benefit from skilled PT to address the above deficits, in addition to her existing POC for her LBP, to improve positional and activity tolerance with decreased pain interference.  OBJECTIVE IMPAIRMENTS: decreased activity tolerance, decreased endurance, decreased knowledge of condition, decreased mobility, difficulty walking, decreased ROM, decreased strength, dizziness, hypomobility, increased fascial restrictions, impaired perceived functional ability, increased muscle spasms, impaired flexibility, impaired sensation, impaired UE functional use, improper body mechanics, postural dysfunction, and pain.   ACTIVITY LIMITATIONS: carrying, lifting, bending, sitting, standing, squatting, sleeping, stairs, transfers, bed mobility, bathing, toileting, dressing, reach over head, and locomotion level  PARTICIPATION LIMITATIONS: meal prep, cleaning, laundry, driving, shopping, community activity, and yard work  PERSONAL FACTORS: Age, Fitness, Past/current experiences, Time since onset of injury/illness/exacerbation, and 3+ comorbidities: Extensive PMH including chronic back and neck pain, OA, osteoporosis, DM, HTN, h/o TIA, diabetic peripheral neuropathy, PVD, headaches, sleep dysfunction, anxiety (refer to problem list and past medical/surgical history for full PMH)  are also affecting patient's functional outcome.   REHAB POTENTIAL: Good  CLINICAL DECISION  MAKING: Evolving/moderate complexity  EVALUATION COMPLEXITY: Moderate   GOALS: Goals reviewed  with patient? Yes  SHORT TERM GOALS: Target date: 10/19/2021   Patient will be independent with initial HEP to improve outcomes and carryover.  Baseline:  Goal status: MET  10/17/21  2.  Patient will report centralization of radicular symptoms.  Baseline:  Goal status: IN PROGRESS  10/17/21 - Pt reports radicular pain now only intermittent and less intense when present - mostly in R thigh but still occasionally to calf  LONG TERM GOALS: Target date: 11/16/2021, revised to 12/13/21 as of re-eval for neck    Patient will be independent with ongoing/advanced HEP for self-management at home.  Baseline:  Goal status: IN PROGRESS   2.  Patient will report 50-75% improvement in low back pain to improve QOL.  Baseline:  Goal status: MET  10/17/21 - Pt reports 70% reduction in pain  3.  Patient to demonstrate ability to achieve and maintain good spinal alignment/posturing and body mechanics needed for daily activities. Baseline:  Goal status: IN PROGRESS  10/17/21 - education provided last visit and pt denies any questions/concerns  4.  Patient will demonstrate functional pain free lumbar ROM to perform ADLs.   Baseline:  Goal status: PARTIALLY MET  10/17/21 - Lumbar ROM improving w/o pain reported  5.  Patient will demonstrate improved B LE strength to >/= 4/5 for improved stability and ease of mobility . Baseline:  Goal status: PARTIALLY MET  10/17/21 - Refer to above MMT table  6.  Patient will report 43 on lumbar FOTO to demonstrate improved functional ability.  Baseline: 28 Goal status: IN PROGRESS   7.  Patient will tolerate 20-30 min of sitting w/o increased pain to improve comfort with eating meals or driving. Baseline:  Goal status: IN PROGRESS  8.  Patient will report 50-75% improvement in neck pain to improve QOL.  Baseline:  Goal status: INITIAL  9  Patient will demonstrate functional pain free cervical ROM for safety with driving.  Baseline:  Goal status: INITIAL  10.   Patient will demonstrate improved posture to decrease muscle imbalance. Baseline:  Goal status: INITIAL  11. Patient will report at least 15% improvement on NDI to demonstrate improved functional ability.    Baseline: TBA Goal status: INITIAL   12. Patient to report ability to perform ADLs, household, and leisure activities without limitation due to neck pain, LOM or weakness. Baseline:  Goal status: INITIAL   PLAN: PT FREQUENCY: 2x/week  PT DURATION: 6 weeks  PLANNED INTERVENTIONS: Therapeutic exercises, Therapeutic activity, Neuromuscular re-education, Balance training, Gait training, Patient/Family education, Self Care, Joint mobilization, Stair training, Aquatic Therapy, Dry Needling, Electrical stimulation, Spinal mobilization, Cryotherapy, Moist heat, Taping, Traction, Ultrasound, Ionotophoresis 69m/ml Dexamethasone, Manual therapy, and Re-evaluation.  PLAN FOR NEXT SESSION:  gently progress postural and lumbopelvic flexibility and strengthening - update HEP as indicated; MT +/- DN to address pain and abnormal muscle tension; modalities PRN for pain; review of posture and body mechanics education PRN   JPercival Spanish PT 11/01/2021, 6:58 PM

## 2021-11-03 ENCOUNTER — Encounter: Payer: Self-pay | Admitting: Physical Therapy

## 2021-11-03 ENCOUNTER — Ambulatory Visit: Payer: Medicare Other | Admitting: Physical Therapy

## 2021-11-03 DIAGNOSIS — M5459 Other low back pain: Secondary | ICD-10-CM

## 2021-11-03 DIAGNOSIS — M542 Cervicalgia: Secondary | ICD-10-CM | POA: Diagnosis not present

## 2021-11-03 DIAGNOSIS — M62830 Muscle spasm of back: Secondary | ICD-10-CM | POA: Diagnosis not present

## 2021-11-03 DIAGNOSIS — R252 Cramp and spasm: Secondary | ICD-10-CM | POA: Diagnosis not present

## 2021-11-03 DIAGNOSIS — M5412 Radiculopathy, cervical region: Secondary | ICD-10-CM

## 2021-11-03 DIAGNOSIS — R293 Abnormal posture: Secondary | ICD-10-CM | POA: Diagnosis not present

## 2021-11-03 DIAGNOSIS — R262 Difficulty in walking, not elsewhere classified: Secondary | ICD-10-CM

## 2021-11-03 DIAGNOSIS — M6281 Muscle weakness (generalized): Secondary | ICD-10-CM | POA: Diagnosis not present

## 2021-11-03 DIAGNOSIS — R2689 Other abnormalities of gait and mobility: Secondary | ICD-10-CM | POA: Diagnosis not present

## 2021-11-03 NOTE — Therapy (Signed)
OUTPATIENT PHYSICAL THERAPY TREATMENT   Patient Name: [name] MRN: [id number] DOB:[birth year], 81 y.o., female Today's Date: 11/03/2021     PT End of Session - 11/03/21 1359     Visit Number 11    Date for PT Re-Evaluation 12/13/21    Authorization Type Medicare    Progress Note Due on Visit 20   Re-Eval for neck on visit #10 - 11/01/21   PT Start Time 1359    PT Stop Time 1446    PT Time Calculation (min) 47 min    Activity Tolerance Patient tolerated treatment well    Behavior During Therapy [facility] for tasks assessed/performed                   Past Medical History:  Diagnosis Date   Abdominal pain in female 03/18/2010   Qualifier: Diagnosis of  By: Carlean Purl MD, Tonna Boehringer E    Anemia 06/08/2014   Anxiety    Arthritis    Spinal Osteoarthritis   Cancer ([place])    Carcinoid tumor of stomach    Cataract    Chest pain    Myoview 12/15 no ischemia.   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 ([place]) 09/05/2006   Qualifier: Diagnosis of  By: Marca Ancona RMA, Lucy     Diabetic peripheral neuropathy ([place]) 10/29/2013   Encounter for preventative adult health care exam with abnormal findings 09/14/2013   Esophageal reflux    Gastric polyp    Fundic Gland   Gastroparesis    Headache(784.0)    Heart murmur    Echocardiogram 2/11: EF 60-65%, mild LAE, grade 1 diastolic dysfunction, aortic valve sclerosis, mean gradient 9 mm of mercury, PASP 34   Hematuria 03/16/2016   Iron deficiency anemia, unspecified    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   Leg swelling    bilateral   Neck pain 04/22/2015   PONV (postoperative nausea and vomiting)    pt states only needs small amount of anesthesia   PSVT (paroxysmal supraventricular tachycardia) ([place])    Pure hypercholesterolemia    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   Renal insufficiency 06/18/2019   Stroke ([place])    tia, 2014   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   Type II or  unspecified type diabetes mellitus without mention of complication, not stated as uncontrolled    Unspecified essential hypertension    Unspecified hereditary and idiopathic peripheral neuropathy 10/29/2013   Past Surgical History:  Procedure Laterality Date   CHOLECYSTECTOMY  1993   COLONOSCOPY  11/11/2010   diverticulosis   DILATATION & CURRETTAGE/HYSTEROSCOPY WITH RESECTOCOPE N/A 02/25/2013   Procedure: Attempted hysteroscopy with uterine perforation;  Surgeon: Jamey Reas de Berton Lan, MD;  Location: [facility];  Service: Gynecology;  Laterality: N/A;   ESOPHAGOGASTRODUODENOSCOPY  08/29/2010; 09/15/2010   Carcinoid tumor less than 1 cm in July 2012 not seen in August 2012 , gastritis, fundic gland polyps   ESOPHAGOGASTRODUODENOSCOPY  05/16/2011   ESOPHAGOGASTRODUODENOSCOPY  06/14/2012   EUS  12/15/2010   Procedure: UPPER ENDOSCOPIC ULTRASOUND (EUS) LINEAR;  Surgeon: Owens Loffler, MD;  Location: [facility];  Service: Endoscopy;  Laterality: N/A;   EYE SURGERY Bilateral    Bi lateral cateracts and bi lateral laser   LAPAROSCOPY N/A 02/25/2013   Procedure: Cystoscopy and laparoscopy with fulguration of uterine serosa;  Surgeon: Jamey Reas de Berton Lan, MD;  Location: [facility];  Service: Gynecology;  Laterality: N/A;   TONSILLECTOMY     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   Renal insufficiency 06/18/2019   Educated about COVID-19 virus infection 05/15/2019   Atrophic vaginitis 01/20/2019   Stenosis of carotid artery 11/12/2018   Anxiety 10/13/2018   Thrush 12/07/2017   Sinusitis 10/11/2017   Headache 07/12/2017   Dizzy spells 11/21/2016   Constipation 11/21/2016    Nonrheumatic aortic valve stenosis 05/23/2016   Aortic atherosclerosis ([facility]) 05/23/2016   Hematuria 03/16/2016   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   Pain of upper abdomen 06/06/2015   Neck pain 04/22/2015   Ear pain 02/28/2015   Abnormal urine 11/21/2014   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   Anemia 06/08/2014   RLS (restless legs syndrome) 11/23/2013   Diabetic peripheral neuropathy ([place]) 10/29/2013   Left-sided thoracic back pain 10/06/2013   Encounter for preventative adult health care exam with abnormal findings 09/14/2013   Iron deficiency anemia    Status post laparoscopy 02/25/2013   Hyponatremia 01/09/2013   GERD (gastroesophageal reflux disease) 01/09/2013   Amaurosis fugax of left eye 10/16/2012   Low back pain 06/03/2012   Vitamin D deficiency 03/11/2012   Bilateral hand pain 10/20/2011   Encounter for long-term (current) use of other medications 10/20/2011   IBS (irritable bowel syndrome) 08/14/2011   TIA (transient ischemic attack) 02/10/2011   Abnormal brain CT 01/19/2011   Allergic rhinitis 10/01/2010   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   FUNDIC GLAND POLYPS OF STOMACH 03/18/2010   Abdominal pain in female 03/18/2010   Pain in joint 33/29/5188   SYSTOLIC MURMUR 41/66/0630   Paroxysmal supraventricular tachycardia ([place]) 01/12/2009   PLANTAR FASCIITIS 06/08/2008   CHEST PAIN 05/18/2008   Gastroparesis 12/18/2007   HYPERCHOLESTEROLEMIA 06/11/2007   Diabetes mellitus type 2 in obese ([place]) 09/05/2006   Essential hypertension 09/05/2006    PCP: [name], MD  REFERRING PROVIDER: Marrian Salvage, FNP (low back pain) & [name], MD (neck pain)  REFERRING DIAG:  M54.41,G89.29 (ICD-10-CM) - Chronic right-sided low back pain with right-sided sciatica M54.2 (ICD-10-CM) - Neck pain  RATIONALE FOR EVALUATION AND TREATMENT: Rehabilitation  THERAPY DIAG:  Cervicalgia  Radiculopathy, cervical region  Abnormal  posture  Cramp and spasm  Other low back pain  Muscle spasm of back  Muscle weakness (generalized)  Difficulty in walking, not elsewhere classified  Other abnormalities of gait and mobility  ONSET DATE: acute on chronic exacerbation ~3 weeks ago; neck pain - chronic   NEXT MD VISIT: 03/28/22 with PCP   SUBJECTIVE:  SUBJECTIVE STATEMENT: Pt reports she in very nervous today because a mouse showed in her bathroom vent today - this is making her more tense.  PAIN:  Are you having pain? Yes: NPRS scale:  7/10 Pain location: Neck - middle Pain description: stabbing Aggravating factors: looking down, sleeping on her side, lifting/carrying groceries Relieving factors: chin tuck exercises, neck stretches, heating pad  Are you having pain? Yes: NPRS scale:  2/10 Pain location: R>L low back with R LE radicular pain and burning sensation Pain description: burning, numbness  PERTINENT HISTORY: Extensive PMH including chronic back and neck pain, OA, osteoporosis, DM, HTN, h/o TIA, diabetic peripheral neuropathy, PVD, headaches, sleep dysfunction, anxiety (refer to problem list and past medical/surgical history for full PMH)  PRECAUTIONS: None  WEIGHT BEARING RESTRICTIONS: No  FALLS:  Has patient fallen in last 6 months? No  LIVING ENVIRONMENT: Lives with: lives alone Lives in: House/apartment Stairs: No Has following equipment at home: Single point cane, Environmental consultant - 4 wheeled, shower chair, and Grab bars  OCCUPATION: Retired  PLOF: Independent and Leisure: read and play card games on her phone, has been doing her prior PT HEP recently qod since pain started up  PATIENT GOALS: "No pain."   OBJECTIVE:   DIAGNOSTIC FINDINGS:  05/25/20 - Lumbar x-ray:  FINDINGS: Decreased osseous  mineralization. Stable lumbar vertebral body heights and alignment. Mild disc space narrowing is similar. Small endplate osteophytes are present at L2-L3 and L3-L4. Mild facet hypertrophy. Aortic calcification. IMPRESSION: Mild lumbar spondylosis similar to prior study (2018).  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 Moderate foraminal stenosis on the left at C4-C5. Mild-to-moderate bilateral foraminal stenosis at C5-C6 and mild foraminal stenosis on the right at C3-C4 and C4-C5. Mild to moderate canal stenosis at C3-C4 and C4-C5. Mild canal stenosis at C5-C6.  PATIENT SURVEYS:  NDI 25 / 23 = 50.0%  (11/03/21) FOTO Lumbar = 28; predicted = 46  SCREENING FOR RED FLAGS: Bowel or bladder incontinence: No Spinal tumors: No Cauda equina syndrome: No Compression fracture: No Abdominal aneurysm: No  COGNITION:  Overall cognitive status: Within functional limits for tasks assessed     SENSATION: Light touch: Impaired  numbness in B feet  MUSCLE LENGTH: Hamstrings: mod tight R>L ITB: mod tight R>L Piriformis: mod tight B Hip flexors: NT Quads: NT Heelcord: NT  POSTURE:  rounded shoulders, forward head, decreased lumbar lordosis, and increased thoracic kyphosis  PALPATION: Increased muscle tension and TTP over lumbar paraspinals and R glutes/piriformis and lateral hip  LUMBAR ROM:   Active  AROM  Eval - 09/21/21  10/17/21  Flexion Hands level with knees Hands to mid shins  Extension 90% limited 75% limited  Right lateral flexion Hand to mid thigh Hand to lateral knee  Left lateral flexion Hand to mid thigh Hand to lateral knee  Right rotation 80% limited 50% limited  Left rotation 80% limited 50% limited    LOWER EXTREMITY ROM:     Mildly limited primarily due to muscle tightness  LOWER EXTREMITY MMT:    MMT Right eval Left eval Right 10/17/21 Left  10/17/21  Hip flexion 3+ 3+ 3+ 3+  Hip extension 3- 3- 3 3-  Hip abduction  4- 3+ 4 4-  Hip adduction 3+ 3+ 3+ 3+  Hip internal rotation 3+ 3+ 4+ 4+  Hip external rotation 3+ 3+ 4- 4-  Knee flexion 4- 4- 4+ 4+  Knee extension 4- 4- 4 4  Ankle dorsiflexion 3+  3+ 4- 4-  Ankle plantarflexion 3- 3-    Ankle inversion      Ankle eversion       (Blank rows = not tested)  LUMBAR SPECIAL TESTS:  Straight leg raise test: Positive on Right  CERVICAL ROM:   Active ROM AROM (deg) Eval 11/01/21  Flexion 29 - pressure  Extension 34 - pain  Right lateral flexion 20 -pain  Left lateral flexion 22  Right rotation 48  Left rotation 49 - pain   (Blank rows = not tested)  UPPER EXTREMITY ROM:  Active ROM Right Eval 11/01/21 Left Eval 11/01/21  Shoulder flexion 126 120  Shoulder extension    Shoulder abduction 109 104  Shoulder adduction    Shoulder internal rotation FIR to T12 FIR to T10  Shoulder external rotation FER to T1 FER to T1  Elbow flexion    Elbow extension    Wrist flexion    Wrist extension    Wrist ulnar deviation    Wrist radial deviation    Wrist pronation    Wrist supination     (Blank rows = not tested)  UPPER EXTREMITY MMT:  MMT Right Eval 11/01/21 Left Eval 11/01/21  Shoulder flexion 4 4-  Shoulder extension 4- 3+  Shoulder abduction 4 4-  Shoulder adduction    Shoulder internal rotation 4- 4-  Shoulder external rotation 4- 3+  Middle trapezius    Lower trapezius    Elbow flexion    Elbow extension    Wrist flexion    Wrist extension    Wrist ulnar deviation    Wrist radial deviation    Wrist pronation    Wrist supination    Grip strength     (Blank rows = not tested)  CERVICAL SPECIAL TESTS:  Spurling's test: Negative and Distraction test: Positive   TODAY'S TREATMENT:  11/03/21 THERAPEUTIC EXERCISE: to improve flexibility, strength and mobility.  Verbal and tactile cues throughout for technique.  UBE - L1.0 x 6' (3' fwd/back) Cervical retraction 10 x 5" L/R seated UT stretch 2 x 30" L/R seated UT stretch 2 x  30" Seated scap retraction + depression 10 x 3-5" Seated scap retraction/depression + shoulder ER 10 x 3-5" Seated thoracolumbar extension over back of chair 10 x 3" Low doorway pec stretch 3 x 30" Seated cervical extension with rolled pillowcase for support 10 x 3" Seated B cervical rotation SNAGs with pillowcase 10 x 3" each   11/01/21 Re-Eval for neck pain - findings as above and under MT  MANUAL THERAPY: To promote normalized muscle tension, improved flexibility, improved joint mobility, increased ROM, and reduced pain. Cervical PROM slightly better than AROM for flexion and B side-bending but extension and B rotation essentially equivalent to AROM Gentle cervical distraction - increases pain with no change in radicular symptoms No change in pain with axial loading B suboccipital release - some relief noted on R but no change on L  THERAPEUTIC EXERCISE: to improve flexibility, strength and mobility.  Verbal and tactile cues throughout for technique. Cervical retraction 10 x 5" with & w/o 2-finger guide L/R seated UT stretch 3 x 30" L/R seated UT stretch 3 x 30" Seated scap retraction + depression 10 x 3-5"   10/24/21 THERAPEUTIC EXERCISE: to improve flexibility, strength and mobility.  Verbal and tactile cues throughout for technique.  NuStep - L6 x 7 min (UE/LE) Standing alt hip abduction x 10 with looped YTB at ankles; UE support on back of chair Standing alt  hip extension x 10 with looped YTB at ankles; UE support on back of chair Standing alt hip flexion march x 10 with looped YTB at midfeet; UE support on back of chair Squat touch to mat table x 10, UE support on back of chair Heel raises + quad/glute/abdominal sets 10 x 3"; UE support on back of chair R/L fwd step-ups to 6" step x 10 each leg B lateral step-ups to 6" step x 10 R/L 6" step up/over x 10   PATIENT EDUCATION:  Education details: PT eval findings, anticipated POC, and HEP update - initial cervical  HEP Person educated: Patient Education method: Explanation, Demonstration, Tactile cues, Verbal cues, and Handouts Education comprehension: verbalized understanding, returned demonstration, verbal cues required, tactile cues required, and needs further education   HOME EXERCISE PROGRAM: Access Code: [email or web address] URL: [email or web address] Date: 11/01/2021 Prepared by: Annie Paras  Exercises - Hooklying Single Knee to Chest Stretch  - 2-3 x daily - 7 x weekly - 3 reps - 30 sec hold - Supine Piriformis Stretch with Foot on Ground  - 2-3 x daily - 7 x weekly - 3 reps - 30 sec hold - Supine Lower Trunk Rotation  - 2-3 x daily - 7 x weekly - 5 reps - 10 sec hold - Supine Posterior Pelvic Tilt  - 2-3 x daily - 7 x weekly - 2 sets - 10 reps - 5 sec hold - Supine Transversus Abdominis Bracing with Leg Extension  - 1 x daily - 7 x weekly - 2 sets - 10 reps - 3 sec hold - Seated Hamstring Stretch  - 2-3 x daily - 7 x weekly - 3 reps - 30 sec hold - Seated Flexion Stretch with Swiss Ball  - 2-3 x daily - 7 x weekly - 3 reps - 30 sec hold - Seated Thoracic Flexion and Rotation with Swiss Ball  - 2-3 x daily - 7 x weekly - 2-3 reps - 30 sec hold - Modified Thomas Stretch (Mirrored)  - 2-3 x daily - 3 x weekly - 3 reps - 30 sec hold - Hooklying Single Leg Bent Knee Fallouts with Resistance  - 1 x daily - 3 x weekly - 2 sets - 10 reps - 3 sec hold - Supine March with Resistance Band  - 1 x daily - 3 x weekly - 2 sets - 10 reps - 2-3 sec hold hold - Bridge with Resistance  - 1 x daily - 3 x weekly - 2 sets - 10 reps - 5 sec hold - Standing Lumbar Extension with Counter  - 1 x daily - 7 x weekly - 2 sets - 10 reps - 3 sec hold - Beginner Clam  - 1 x daily - 3 x weekly - 2 sets - 10 reps - 3 sec hold - Standing Hip Abduction with Resistance at Ankles and Counter Support  - 1 x daily - 3 x weekly - 2 sets - 10 reps - 3 sec hold - Standing Hip Extension with Resistance at Ankles and Counter  Support  - 1 x daily - 3 x weekly - 2 sets - 10 reps - 3 sec hold - Marching with Resistance  - 1 x daily - 3 x weekly - 2 sets - 10 reps - 3 sec hold - Squat with Chair and Counter Support  - 1 x daily - 3 x weekly - 2 sets - 10 reps - 3 sec hold - Heel Raises with Counter Support  -  1 x daily - 3 x weekly - 2 sets - 10 reps - 3 sec hold - Seated Cervical Retraction  - 2 x daily - 7 x weekly - 2 sets - 10 reps - 3-5 sec hold - Seated Gentle Upper Trapezius Stretch  - 2 x daily - 7 x weekly - 3 reps - 30 sec hold - Gentle Levator Scapulae Stretch  - 2 x daily - 7 x weekly - 3 reps - 30 sec hold - Seated Scapular Retraction  - 3 x daily - 7 x weekly - 2 sets - 10 reps - 5 sec hold  Patient Education - Posture and Body Mechanics   ASSESSMENT:  CLINICAL IMPRESSION: Initial HEP reviewed with clarification necessary for hold times and reps but pt able to perform good return demonstration for technique, only needing cues for head position with UT and LS stretches. Continued emphasis on postural correction and cervicothoracic mobility targeting extension and retraction to promote better alignment for decreased muscle strain but pt reverting to slouched posture between exercises. Will plan to incorporate progression of postural strengthening in upcoming visits.  OBJECTIVE IMPAIRMENTS: decreased activity tolerance, decreased endurance, decreased knowledge of condition, decreased mobility, difficulty walking, decreased ROM, decreased strength, dizziness, hypomobility, increased fascial restrictions, impaired perceived functional ability, increased muscle spasms, impaired flexibility, impaired sensation, impaired UE functional use, improper body mechanics, postural dysfunction, and pain.   ACTIVITY LIMITATIONS: carrying, lifting, bending, sitting, standing, squatting, sleeping, stairs, transfers, bed mobility, bathing, toileting, dressing, reach over head, and locomotion level  PARTICIPATION LIMITATIONS:  meal prep, cleaning, laundry, driving, shopping, community activity, and yard work  PERSONAL FACTORS: Age, Fitness, Past/current experiences, Time since onset of injury/illness/exacerbation, and 3+ comorbidities: Extensive PMH including chronic back and neck pain, OA, osteoporosis, DM, HTN, h/o TIA, diabetic peripheral neuropathy, PVD, headaches, sleep dysfunction, anxiety (refer to problem list and past medical/surgical history for full PMH)  are also affecting patient's functional outcome.   REHAB POTENTIAL: Good  CLINICAL DECISION MAKING: Evolving/moderate complexity  EVALUATION COMPLEXITY: Moderate   GOALS: Goals reviewed with patient? Yes  SHORT TERM GOALS: Target date: 10/19/2021   Patient will be independent with initial HEP to improve outcomes and carryover.  Baseline:  Goal status: MET  10/17/21  2.  Patient will report centralization of radicular symptoms.  Baseline:  Goal status: IN PROGRESS  10/17/21 - Pt reports radicular pain now only intermittent and less intense when present - mostly in R thigh but still occasionally to calf  LONG TERM GOALS: Target date: 11/16/2021, revised to 12/13/21 as of re-eval for neck    Patient will be independent with ongoing/advanced HEP for self-management at home.  Baseline:  Goal status: IN PROGRESS   2.  Patient will report 50-75% improvement in low back pain to improve QOL.  Baseline:  Goal status: MET  10/17/21 - Pt reports 70% reduction in pain  3.  Patient to demonstrate ability to achieve and maintain good spinal alignment/posturing and body mechanics needed for daily activities. Baseline:  Goal status: IN PROGRESS  10/17/21 - education provided last visit and pt denies any questions/concerns  4.  Patient will demonstrate functional pain free lumbar ROM to perform ADLs.   Baseline:  Goal status: PARTIALLY MET  10/17/21 - Lumbar ROM improving w/o pain reported  5.  Patient will demonstrate improved B LE strength to >/= 4/5 for  improved stability and ease of mobility . Baseline:  Goal status: PARTIALLY MET  10/17/21 - Refer to above MMT table  6.  Patient will report 46 on lumbar FOTO to demonstrate improved functional ability.  Baseline: 28 Goal status: IN PROGRESS   7.  Patient will tolerate 20-30 min of sitting w/o increased pain to improve comfort with eating meals or driving. Baseline:  Goal status: IN PROGRESS  8.  Patient will report 50-75% improvement in neck pain to improve QOL.  Baseline:  Goal status: IN PROGRESS  9  Patient will demonstrate functional pain free cervical ROM for safety with driving.  Baseline:  Goal status: IN PROGRESS  10.  Patient will demonstrate improved posture to decrease muscle imbalance. Baseline:  Goal status: IN PROGRESS  11. Patient will report at least 15% improvement on NDI to demonstrate improved functional ability.    Baseline: 25 / 50 = 50.0% Goal status: IN PROGRESS   12. Patient to report ability to perform ADLs, household, and leisure activities without limitation due to neck pain, LOM or weakness. Baseline:  Goal status: IN PROGRESS   PLAN: PT FREQUENCY: 2x/week  PT DURATION: 6 weeks  PLANNED INTERVENTIONS: Therapeutic exercises, Therapeutic activity, Neuromuscular re-education, Balance training, Gait training, Patient/Family education, Self Care, Joint mobilization, Stair training, Aquatic Therapy, Dry Needling, Electrical stimulation, Spinal mobilization, Cryotherapy, Moist heat, Taping, Traction, Ultrasound, Ionotophoresis 30m/ml Dexamethasone, Manual therapy, and Re-evaluation.  PLAN FOR NEXT SESSION:  gently progress postural and lumbopelvic flexibility and strengthening - update HEP as indicated; MT +/- DN to address pain and abnormal muscle tension; modalities PRN for pain; review of posture and body mechanics education PRN   JPercival Spanish PT 11/03/2021, 2:53 PM

## 2021-11-04 ENCOUNTER — Inpatient Hospital Stay: Payer: Medicare Other | Attending: Hematology & Oncology

## 2021-11-04 ENCOUNTER — Inpatient Hospital Stay: Payer: Medicare Other | Admitting: Family

## 2021-11-04 ENCOUNTER — Encounter: Payer: Self-pay | Admitting: Family

## 2021-11-04 VITALS — BP 121/48 | Temp 98.2°F | Resp 17 | Wt 168.1 lb

## 2021-11-04 DIAGNOSIS — D500 Iron deficiency anemia secondary to blood loss (chronic): Secondary | ICD-10-CM

## 2021-11-04 DIAGNOSIS — K909 Intestinal malabsorption, unspecified: Secondary | ICD-10-CM | POA: Insufficient documentation

## 2021-11-04 LAB — CBC WITH DIFFERENTIAL (CANCER CENTER ONLY)
Abs Immature Granulocytes: 0.02 10*3/uL (ref 0.00–0.07)
Basophils Absolute: 0.1 10*3/uL (ref 0.0–0.1)
Basophils Relative: 1 %
Eosinophils Absolute: 0.1 10*3/uL (ref 0.0–0.5)
Eosinophils Relative: 1 %
HCT: 37.9 % (ref 36.0–46.0)
Hemoglobin: 12.7 g/dL (ref 12.0–15.0)
Immature Granulocytes: 0 %
Lymphocytes Relative: 30 %
Lymphs Abs: 2.8 10*3/uL (ref 0.7–4.0)
MCH: 28.9 pg (ref 26.0–34.0)
MCHC: 33.5 g/dL (ref 30.0–36.0)
MCV: 86.3 fL (ref 80.0–100.0)
Monocytes Absolute: 0.8 10*3/uL (ref 0.1–1.0)
Monocytes Relative: 8 %
Neutro Abs: 5.5 10*3/uL (ref 1.7–7.7)
Neutrophils Relative %: 60 %
Platelet Count: 229 10*3/uL (ref 150–400)
RBC: 4.39 MIL/uL (ref 3.87–5.11)
RDW: 12.1 % (ref 11.5–15.5)
WBC Count: 9.2 10*3/uL (ref 4.0–10.5)
nRBC: 0 % (ref 0.0–0.2)

## 2021-11-04 LAB — RETICULOCYTES
Immature Retic Fract: 5.5 % (ref 2.3–15.9)
RBC.: 4.36 MIL/uL (ref 3.87–5.11)
Retic Count, Absolute: 74.1 10*3/uL (ref 19.0–186.0)
Retic Ct Pct: 1.7 % (ref 0.4–3.1)

## 2021-11-04 LAB — FERRITIN: Ferritin: 274 ng/mL (ref 11–307)

## 2021-11-04 NOTE — Progress Notes (Signed)
Hematology and Oncology Follow Up Visit  [name] [id number] [birth year] 81 y.o. 11/04/2021   Principle Diagnosis:  Iron deficiency anemia secondary to malabsorption   Current Therapy:        IV iron as indicated   Interim History:  [name] is here today for follow-up. She is doing well. She has started PT for strengthening and feels that this is helping her tremendously.  No issues with blood loss. No bruising or petechiae.  No fever, chills, n/v, cough, rash, dizziness, SOB, chest pain, palpitations, abdominal pain or changes in bowel or bladder habits.  No swelling or tenderness in her extremities.  She has occasional tingling in the left arm which she states is due to an issue with her neck.  No falls or syncope reported.  Appetite and hydration are good. Weight is stable at 168 lbs.   ECOG Performance Status: 1 - Symptomatic but completely ambulatory  Medications:  Allergies as of 11/04/2021       Reactions   Tramadol Other (See Comments)   Dizziness        Medication List        Accurate as of November 04, 2021  2:12 PM. If you have any questions, ask your nurse or doctor.          acetaminophen 325 MG tablet Commonly known as: Tylenol Take 2 tablets (650 mg total) by mouth every 6 (six) hours as needed for up to 30 doses for mild pain or moderate pain.   amLODipine 5 MG tablet Commonly known as: NORVASC TAKE 1 TABLET BY MOUTH  DAILY   aspirin EC 81 MG tablet Take 81 mg by mouth daily. Swallow whole.   betamethasone valerate ointment 0.1 % Commonly known as: VALISONE Apply a small amount to affected area topically every other day.   Carestart COVID-19 Home Test Kit Generic drug: COVID-19 At Home Antigen Test Use as directed   cholecalciferol 1000 units tablet Commonly known as: VITAMIN D Take 1,000 Units by mouth daily.   diclofenac Sodium 2 % Soln Commonly known as: Pennsaid Apply 2 Pump (40 mg total) topically 2 (two) times  daily as needed.   dicyclomine 10 MG capsule Commonly known as: BENTYL TAKE 1 CAPSULE BY MOUTH 4 TIMES  DAILY AS NEEDED   furosemide 20 MG tablet Commonly known as: LASIX TAKE 1 TABLET BY MOUTH  DAILY AS NEEDED   losartan 50 MG tablet Commonly known as: COZAAR TAKE 1 TABLET BY MOUTH TWICE  DAILY   metFORMIN 500 MG 24 hr tablet Commonly known as: GLUCOPHAGE-XR TAKE 1 TABLET BY MOUTH IN  THE MORNING AND AT BEDTIME   methocarbamol 500 MG tablet Commonly known as: ROBAXIN Take 1 tablet (500 mg total) by mouth at bedtime as needed for muscle spasms.   metoCLOPramide 5 MG tablet Commonly known as: REGLAN TAKE 1 TABLET BY MOUTH TWICE  DAILY AS NEEDED   metoprolol tartrate 50 MG tablet Commonly known as: LOPRESSOR TAKE 1 AND 1/2 TABLETS BY  MOUTH TWICE DAILY   NexIUM 40 MG capsule Generic drug: esomeprazole Take 1 capsule (40 mg total) by mouth daily before breakfast.   ondansetron 4 MG tablet Commonly known as: ZOFRAN Take 4 mg by mouth every 4 (four) hours as needed. For nausea   ONE TOUCH ULTRA 2 w/Device Kit USE TO CHECK BLOOD SUGAR AS DIRECTED   OneTouch Ultra test strip Generic drug: glucose blood CHECK BLOOD SUGAR 3 TIMES  DAILY OR AS NEEDED  onetouch ultrasoft lancets USE AS DIRECTED 3 TIMES  DAILY   potassium chloride 10 MEQ tablet Commonly known as: KLOR-CON M TAKE 1 TABLET BY MOUTH  DAILY   rosuvastatin 20 MG tablet Commonly known as: CRESTOR TAKE 1 TABLET BY MOUTH  DAILY   thiamine 100 MG tablet Commonly known as: Vitamin B-1 Take 100 mg by mouth every other day.        Allergies:  Allergies  Allergen Reactions   Tramadol Other (See Comments)    Dizziness     Past Medical History, Surgical history, Social history, and Family History were reviewed and updated.  Review of Systems: All other 10 point review of systems is negative.   Physical Exam:  weight is 168 lb 1.9 oz (76.3 kg). Her oral temperature is 98.2 F (36.8 C). Her blood  pressure is 121/48 (abnormal). Her respiration is 17 and oxygen saturation is 99%.   Wt Readings from Last 3 Encounters:  11/04/21 168 lb 1.9 oz (76.3 kg)  10/13/21 169 lb 3.2 oz (76.7 kg)  09/06/21 169 lb (76.7 kg)    Ocular: Sclerae unicteric, pupils equal, round and reactive to light Ear-nose-throat: Oropharynx clear, dentition fair Lymphatic: No cervical or supraclavicular adenopathy Lungs no rales or rhonchi, good excursion bilaterally Heart regular rate and rhythm, no murmur appreciated Abd soft, nontender, positive bowel sounds MSK no focal spinal tenderness, no joint edema Neuro: non-focal, well-oriented, appropriate affect Breasts: Deferred   Lab Results  Component Value Date   WBC 9.2 11/04/2021   HGB 12.7 11/04/2021   HCT 37.9 11/04/2021   MCV 86.3 11/04/2021   PLT 229 11/04/2021   Lab Results  Component Value Date   FERRITIN 285 05/31/2021   IRON 82 05/31/2021   TIBC 320 05/31/2021   UIBC 286 05/04/2021   IRONPCTSAT 26 05/31/2021   Lab Results  Component Value Date   RETICCTPCT 1.7 11/04/2021   RBC 4.39 11/04/2021   RBC 4.36 11/04/2021   RETICCTABS 39.8 01/22/2015   No results found for: "KPAFRELGTCHN", "LAMBDASER", "KAPLAMBRATIO" Lab Results  Component Value Date   IGA 228 06/06/2011   No results found for: "TOTALPROTELP", "ALBUMINELP", "A1GS", "A2GS", "BETS", "BETA2SER", "GAMS", "MSPIKE", "SPEI"   Chemistry      Component Value Date/Time   NA 137 10/11/2021 0928   NA 136 05/08/2016 1305   NA 134 (L) 07/08/2015 1149   K 4.4 10/11/2021 0928   K 4.2 05/08/2016 1305   K 4.4 07/08/2015 1149   CL 99 10/11/2021 0928   CL 102 05/08/2016 1305   CO2 29 10/11/2021 0928   CO2 26 05/08/2016 1305   CO2 25 07/08/2015 1149   BUN 21 10/11/2021 0928   BUN 16 05/08/2016 1305   BUN 15.5 07/08/2015 1149   CREATININE 0.85 10/11/2021 0928   CREATININE 0.91 08/05/2021 1415   CREATININE 0.8 07/08/2015 1149      Component Value Date/Time   CALCIUM 9.9  10/11/2021 0928   CALCIUM 9.4 05/08/2016 1305   CALCIUM 10.0 07/08/2015 1149   ALKPHOS 58 10/11/2021 0928   ALKPHOS 56 05/08/2016 1305   ALKPHOS 62 07/08/2015 1149   AST 19 10/11/2021 0928   AST 19 05/04/2021 1345   AST 24 07/08/2015 1149   ALT 18 10/11/2021 0928   ALT 22 05/04/2021 1345   ALT 29 05/08/2016 1305   ALT 30 07/08/2015 1149   BILITOT 0.7 10/11/2021 0928   BILITOT 0.7 05/04/2021 1345   BILITOT 0.64 07/08/2015 1149  Impression and Plan: Ms. Carton is a very pleasant 81 yo female with history of iron deficiency anemia secondary to malabsorption. Iron studies are pending.  Follow-up in 6 months.   Lottie Dawson, NP 9/29/20232:12 PM

## 2021-11-07 ENCOUNTER — Encounter: Payer: Self-pay | Admitting: Physical Therapy

## 2021-11-07 ENCOUNTER — Ambulatory Visit: Payer: Medicare Other | Attending: Family | Admitting: Physical Therapy

## 2021-11-07 DIAGNOSIS — M5412 Radiculopathy, cervical region: Secondary | ICD-10-CM | POA: Insufficient documentation

## 2021-11-07 DIAGNOSIS — M5459 Other low back pain: Secondary | ICD-10-CM | POA: Diagnosis not present

## 2021-11-07 DIAGNOSIS — M6281 Muscle weakness (generalized): Secondary | ICD-10-CM | POA: Diagnosis not present

## 2021-11-07 DIAGNOSIS — R262 Difficulty in walking, not elsewhere classified: Secondary | ICD-10-CM | POA: Insufficient documentation

## 2021-11-07 DIAGNOSIS — R2689 Other abnormalities of gait and mobility: Secondary | ICD-10-CM | POA: Insufficient documentation

## 2021-11-07 DIAGNOSIS — R252 Cramp and spasm: Secondary | ICD-10-CM | POA: Diagnosis not present

## 2021-11-07 DIAGNOSIS — M62830 Muscle spasm of back: Secondary | ICD-10-CM | POA: Insufficient documentation

## 2021-11-07 DIAGNOSIS — M542 Cervicalgia: Secondary | ICD-10-CM | POA: Diagnosis not present

## 2021-11-07 DIAGNOSIS — R293 Abnormal posture: Secondary | ICD-10-CM | POA: Diagnosis not present

## 2021-11-07 LAB — IRON AND IRON BINDING CAPACITY (CC-WL,HP ONLY)
Iron: 62 ug/dL (ref 28–170)
Saturation Ratios: 17 % (ref 10.4–31.8)
TIBC: 361 ug/dL (ref 250–450)
UIBC: 299 ug/dL (ref 148–442)

## 2021-11-07 NOTE — Therapy (Signed)
OUTPATIENT PHYSICAL THERAPY TREATMENT   Patient Name: [name] MRN: [id number] DOB:10-12-1940, 81 y.o., female Today's Date: 11/07/2021     PT End of Session - 11/07/21 1318     Visit Number 12    Date for PT Re-Evaluation 12/13/21    Authorization Type Medicare    Progress Note Due on Visit 20   Re-Eval for neck on visit #10 - 11/01/21   PT Start Time 1318    PT Stop Time 1400    PT Time Calculation (min) 42 min    Activity Tolerance Patient tolerated treatment well    Behavior During Therapy [facility] for tasks assessed/performed                    Past Medical History:  Diagnosis Date   Abdominal pain in female 03/18/2010   Qualifier: Diagnosis of  By: Carlean Purl MD, Tonna Boehringer E    Anemia 06/08/2014   Anxiety    Arthritis    Spinal Osteoarthritis   Cancer ([place])    Carcinoid tumor of stomach    Cataract    Chest pain    Myoview 12/15 no ischemia.   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 ([place]) 09/05/2006   Qualifier: Diagnosis of  By: Marca Ancona RMA, Lucy     Diabetic peripheral neuropathy ([place]) 10/29/2013   Encounter for preventative adult health care exam with abnormal findings 09/14/2013   Esophageal reflux    Gastric polyp    Fundic Gland   Gastroparesis    Headache(784.0)    Heart murmur    Echocardiogram 2/11: EF 60-65%, mild LAE, grade 1 diastolic dysfunction, aortic valve sclerosis, mean gradient 9 mm of mercury, PASP 34   Hematuria 03/16/2016   Iron deficiency anemia, unspecified    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   Leg swelling    bilateral   Neck pain 04/22/2015   PONV (postoperative nausea and vomiting)    pt states only needs small amount of anesthesia   PSVT (paroxysmal supraventricular tachycardia)    Pure hypercholesterolemia    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   Renal insufficiency 06/18/2019   Stroke ([place])    tia, 2014   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   Type II or  unspecified type diabetes mellitus without mention of complication, not stated as uncontrolled    Unspecified essential hypertension    Unspecified hereditary and idiopathic peripheral neuropathy 10/29/2013   Past Surgical History:  Procedure Laterality Date   CHOLECYSTECTOMY  1993   COLONOSCOPY  11/11/2010   diverticulosis   DILATATION & CURRETTAGE/HYSTEROSCOPY WITH RESECTOCOPE N/A 02/25/2013   Procedure: Attempted hysteroscopy with uterine perforation;  Surgeon: Jamey Reas de Berton Lan, MD;  Location: [facility];  Service: Gynecology;  Laterality: N/A;   ESOPHAGOGASTRODUODENOSCOPY  08/29/2010; 09/15/2010   Carcinoid tumor less than 1 cm in July 2012 not seen in August 2012 , gastritis, fundic gland polyps   ESOPHAGOGASTRODUODENOSCOPY  05/16/2011   ESOPHAGOGASTRODUODENOSCOPY  06/14/2012   EUS  12/15/2010   Procedure: UPPER ENDOSCOPIC ULTRASOUND (EUS) LINEAR;  Surgeon: Owens Loffler, MD;  Location: [facility];  Service: Endoscopy;  Laterality: N/A;   EYE SURGERY Bilateral    Bi lateral cateracts and bi lateral laser   LAPAROSCOPY N/A 02/25/2013   Procedure: Cystoscopy and laparoscopy with fulguration of uterine serosa;  Surgeon: Jamey Reas de Berton Lan, MD;  Location: [facility];  Service: Gynecology;  Laterality: N/A;   TONSILLECTOMY     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   Renal insufficiency 06/18/2019   Educated about COVID-19 virus infection 05/15/2019   Atrophic vaginitis 01/20/2019   Stenosis of carotid artery 11/12/2018   Anxiety 10/13/2018   Thrush 12/07/2017   Sinusitis 10/11/2017   Headache 07/12/2017   Dizzy spells 11/21/2016   Constipation 11/21/2016    Nonrheumatic aortic valve stenosis 05/23/2016   Aortic atherosclerosis ([facility]) 05/23/2016   Hematuria 03/16/2016   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   Pain of upper abdomen 06/06/2015   Neck pain 04/22/2015   Ear pain 02/28/2015   Abnormal urine 11/21/2014   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/10/2014   Anemia 06/08/2014   RLS (restless legs syndrome) 11/23/2013   Diabetic peripheral neuropathy ([place]) 10/29/2013   Left-sided thoracic back pain 10/06/2013   Encounter for preventative adult health care exam with abnormal findings 09/14/2013   Iron deficiency anemia    Status post laparoscopy 02/25/2013   Hyponatremia 01/09/2013   GERD (gastroesophageal reflux disease) 01/09/2013   Amaurosis fugax of left eye 10/16/2012   Low back pain 06/03/2012   Vitamin D deficiency 03/11/2012   Bilateral hand pain 10/20/2011   Encounter for long-term (current) use of other medications 10/20/2011   IBS (irritable bowel syndrome) 08/14/2011   TIA (transient ischemic attack) 02/10/2011   Abnormal brain CT 01/19/2011   Allergic rhinitis 10/01/2010   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   FUNDIC GLAND POLYPS OF STOMACH 03/18/2010   Abdominal pain in female 03/18/2010   Pain in joint 11/94/1740   SYSTOLIC MURMUR 81/44/8185   Paroxysmal supraventricular tachycardia 01/12/2009   PLANTAR FASCIITIS 06/08/2008   CHEST PAIN 05/18/2008   Gastroparesis 12/18/2007   HYPERCHOLESTEROLEMIA 06/11/2007   Diabetes mellitus type 2 in obese ([place]) 09/05/2006   Essential hypertension 09/05/2006    PCP: [name], MD  REFERRING PROVIDER: Marrian Salvage, FNP (low back pain) & [name], MD (neck pain)  REFERRING DIAG:  M54.41,G89.29 (ICD-10-CM) - Chronic right-sided low back pain with right-sided sciatica M54.2 (ICD-10-CM) - Neck pain  RATIONALE FOR EVALUATION AND TREATMENT: Rehabilitation  THERAPY DIAG:  Cervicalgia  Radiculopathy, cervical region  Abnormal  posture  Cramp and spasm  Other low back pain  Muscle spasm of back  Muscle weakness (generalized)  Difficulty in walking, not elsewhere classified  Other abnormalities of gait and mobility  ONSET DATE: acute on chronic exacerbation ~3 weeks ago; neck pain - chronic   NEXT MD VISIT: 03/28/22 with PCP   SUBJECTIVE:  SUBJECTIVE STATEMENT: Pt reports her arms were sore after last visit. Pt reports she decided to take the weekend off from exercises, so she has not tried all of the new neck HEP exercises.  PAIN:  Are you having pain? Yes: NPRS scale:  7/10 Pain location: Neck - middle Pain description: stabbing Aggravating factors: looking down, sleeping on her side, lifting/carrying groceries Relieving factors: chin tuck exercises, neck stretches, heating pad  Are you having pain? No - Low back  PERTINENT HISTORY: Extensive PMH including chronic back and neck pain, OA, osteoporosis, DM, HTN, h/o TIA, diabetic peripheral neuropathy, PVD, headaches, sleep dysfunction, anxiety (refer to problem list and past medical/surgical history for full PMH)  PRECAUTIONS: None  WEIGHT BEARING RESTRICTIONS: No  FALLS:  Has patient fallen in last 6 months? No  LIVING ENVIRONMENT: Lives with: lives alone Lives in: House/apartment Stairs: No Has following equipment at home: Single point cane, Environmental consultant - 4 wheeled, shower chair, and Grab bars  OCCUPATION: Retired  PLOF: Independent and Leisure: read and play card games on her phone, has been doing her prior PT HEP recently qod since pain started up  PATIENT GOALS: "No pain."   OBJECTIVE:   DIAGNOSTIC FINDINGS:  05/25/20 - Lumbar x-ray:  FINDINGS: Decreased osseous mineralization. Stable lumbar vertebral body heights and alignment. Mild disc space  narrowing is similar. Small endplate osteophytes are present at L2-L3 and L3-L4. Mild facet hypertrophy. Aortic calcification. IMPRESSION: Mild lumbar spondylosis similar to prior study (2018).  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 Moderate foraminal stenosis on the left at C4-C5. Mild-to-moderate bilateral foraminal stenosis at C5-C6 and mild foraminal stenosis on the right at C3-C4 and C4-C5. Mild to moderate canal stenosis at C3-C4 and C4-C5. Mild canal stenosis at C5-C6.  PATIENT SURVEYS:  NDI 25 / 58 = 50.0%  (11/03/21) FOTO Lumbar = 28; predicted = 46  SCREENING FOR RED FLAGS: Bowel or bladder incontinence: No Spinal tumors: No Cauda equina syndrome: No Compression fracture: No Abdominal aneurysm: No  COGNITION:  Overall cognitive status: Within functional limits for tasks assessed     SENSATION: Light touch: Impaired  numbness in B feet  MUSCLE LENGTH: Hamstrings: mod tight R>L ITB: mod tight R>L Piriformis: mod tight B Hip flexors: NT Quads: NT Heelcord: NT  POSTURE:  rounded shoulders, forward head, decreased lumbar lordosis, and increased thoracic kyphosis  PALPATION: Increased muscle tension and TTP over lumbar paraspinals and R glutes/piriformis and lateral hip  LUMBAR ROM:   Active  AROM  Eval - 09/21/21  10/17/21  Flexion Hands level with knees Hands to mid shins  Extension 90% limited 75% limited  Right lateral flexion Hand to mid thigh Hand to lateral knee  Left lateral flexion Hand to mid thigh Hand to lateral knee  Right rotation 80% limited 50% limited  Left rotation 80% limited 50% limited    LOWER EXTREMITY ROM:     Mildly limited primarily due to muscle tightness  LOWER EXTREMITY MMT:    MMT Right eval Left eval Right 10/17/21 Left  10/17/21  Hip flexion 3+ 3+ 3+ 3+  Hip extension 3- 3- 3 3-  Hip abduction 4- 3+ 4 4-  Hip adduction 3+ 3+ 3+ 3+  Hip internal rotation 3+ 3+ 4+ 4+  Hip  external rotation 3+ 3+ 4- 4-  Knee flexion 4- 4- 4+ 4+  Knee extension 4- 4- 4 4  Ankle dorsiflexion 3+ 3+ 4- 4-  Ankle plantarflexion 3- 3-  Ankle inversion      Ankle eversion       (Blank rows = not tested)  LUMBAR SPECIAL TESTS:  Straight leg raise test: Positive on Right  CERVICAL ROM:   Active ROM AROM (deg) Eval 11/01/21  Flexion 29 - pressure  Extension 34 - pain  Right lateral flexion 20 -pain  Left lateral flexion 22  Right rotation 48  Left rotation 49 - pain   (Blank rows = not tested)  UPPER EXTREMITY ROM:  Active ROM Right Eval 11/01/21 Left Eval 11/01/21  Shoulder flexion 126 120  Shoulder extension    Shoulder abduction 109 104  Shoulder adduction    Shoulder internal rotation FIR to T12 FIR to T10  Shoulder external rotation FER to T1 FER to T1  Elbow flexion    Elbow extension    Wrist flexion    Wrist extension    Wrist ulnar deviation    Wrist radial deviation    Wrist pronation    Wrist supination     (Blank rows = not tested)  UPPER EXTREMITY MMT:  MMT Right Eval 11/01/21 Left Eval 11/01/21  Shoulder flexion 4 4-  Shoulder extension 4- 3+  Shoulder abduction 4 4-  Shoulder adduction    Shoulder internal rotation 4- 4-  Shoulder external rotation 4- 3+  Middle trapezius    Lower trapezius    Elbow flexion    Elbow extension    Wrist flexion    Wrist extension    Wrist ulnar deviation    Wrist radial deviation    Wrist pronation    Wrist supination    Grip strength     (Blank rows = not tested)  CERVICAL SPECIAL TESTS:  Spurling's test: Negative and Distraction test: Positive   TODAY'S TREATMENT:   11/07/21 THERAPEUTIC EXERCISE: to improve flexibility, strength and mobility.  Verbal and tactile cues throughout for technique.  UBE - L1.0 x 6' (3' fwd/back) Low doorway pec stretch 2 x 30" Seated YTB scap retraction + shoulder row 10 x 3" Seated YTB scap retraction + shoulder extension to neutral 10 x 3" Seated YTB  scap retraction/depression + shoulder ER 10 x 3-5" Seated thoracolumbar extension over back of chair 10 x 3" Seated cervical extension with rolled pillowcase for support 10 x 3" Seated B cervical rotation SNAGs with pillowcase 10 x 3" each Thoracolumbar extension with elbows resting on wall 10 x 3"   11/03/21 THERAPEUTIC EXERCISE: to improve flexibility, strength and mobility.  Verbal and tactile cues throughout for technique.  UBE - L1.0 x 6' (3' fwd/back) Cervical retraction 10 x 5" L/R seated UT stretch 2 x 30" L/R seated UT stretch 2 x 30" Seated scap retraction + depression 10 x 3-5" Seated scap retraction/depression + shoulder ER 10 x 3-5" Seated thoracolumbar extension over back of chair 10 x 3" Low doorway pec stretch 3 x 30" Seated cervical extension with rolled pillowcase for support 10 x 3" Seated B cervical rotation SNAGs with pillowcase 10 x 3" each   11/01/21 Re-Eval for neck pain - findings as above and under MT  MANUAL THERAPY: To promote normalized muscle tension, improved flexibility, improved joint mobility, increased ROM, and reduced pain. Cervical PROM slightly better than AROM for flexion and B side-bending but extension and B rotation essentially equivalent to AROM Gentle cervical distraction - increases pain with no change in radicular symptoms No change in pain with axial loading B suboccipital release - some relief noted on R but no  change on L  THERAPEUTIC EXERCISE: to improve flexibility, strength and mobility.  Verbal and tactile cues throughout for technique. Cervical retraction 10 x 5" with & w/o 2-finger guide L/R seated UT stretch 3 x 30" L/R seated UT stretch 3 x 30" Seated scap retraction + depression 10 x 3-5"   PATIENT EDUCATION:  Education details: PT eval findings, anticipated POC, and HEP update - initial cervical HEP Person educated: Patient Education method: Explanation, Demonstration, Tactile cues, Verbal cues, and Handouts Education  comprehension: verbalized understanding, returned demonstration, verbal cues required, tactile cues required, and needs further education   HOME EXERCISE PROGRAM: Access Code: [email or web address] URL: [email or web address] Date: 11/01/2021 Prepared by: Annie Paras  Exercises - Hooklying Single Knee to Chest Stretch  - 2-3 x daily - 7 x weekly - 3 reps - 30 sec hold - Supine Piriformis Stretch with Foot on Ground  - 2-3 x daily - 7 x weekly - 3 reps - 30 sec hold - Supine Lower Trunk Rotation  - 2-3 x daily - 7 x weekly - 5 reps - 10 sec hold - Supine Posterior Pelvic Tilt  - 2-3 x daily - 7 x weekly - 2 sets - 10 reps - 5 sec hold - Supine Transversus Abdominis Bracing with Leg Extension  - 1 x daily - 7 x weekly - 2 sets - 10 reps - 3 sec hold - Seated Hamstring Stretch  - 2-3 x daily - 7 x weekly - 3 reps - 30 sec hold - Seated Flexion Stretch with Swiss Ball  - 2-3 x daily - 7 x weekly - 3 reps - 30 sec hold - Seated Thoracic Flexion and Rotation with Swiss Ball  - 2-3 x daily - 7 x weekly - 2-3 reps - 30 sec hold - Modified Thomas Stretch (Mirrored)  - 2-3 x daily - 3 x weekly - 3 reps - 30 sec hold - Hooklying Single Leg Bent Knee Fallouts with Resistance  - 1 x daily - 3 x weekly - 2 sets - 10 reps - 3 sec hold - Supine March with Resistance Band  - 1 x daily - 3 x weekly - 2 sets - 10 reps - 2-3 sec hold hold - Bridge with Resistance  - 1 x daily - 3 x weekly - 2 sets - 10 reps - 5 sec hold - Standing Lumbar Extension with Counter  - 1 x daily - 7 x weekly - 2 sets - 10 reps - 3 sec hold - Beginner Clam  - 1 x daily - 3 x weekly - 2 sets - 10 reps - 3 sec hold - Standing Hip Abduction with Resistance at Ankles and Counter Support  - 1 x daily - 3 x weekly - 2 sets - 10 reps - 3 sec hold - Standing Hip Extension with Resistance at Ankles and Counter Support  - 1 x daily - 3 x weekly - 2 sets - 10 reps - 3 sec hold - Marching with Resistance  - 1 x daily - 3 x weekly - 2 sets - 10  reps - 3 sec hold - Squat with Chair and Counter Support  - 1 x daily - 3 x weekly - 2 sets - 10 reps - 3 sec hold - Heel Raises with Counter Support  - 1 x daily - 3 x weekly - 2 sets - 10 reps - 3 sec hold - Seated Cervical Retraction  - 2 x daily - 7 x weekly -  2 sets - 10 reps - 3-5 sec hold - Seated Gentle Upper Trapezius Stretch  - 2 x daily - 7 x weekly - 3 reps - 30 sec hold - Gentle Levator Scapulae Stretch  - 2 x daily - 7 x weekly - 3 reps - 30 sec hold - Seated Scapular Retraction  - 3 x daily - 7 x weekly - 2 sets - 10 reps - 5 sec hold  Patient Education - Posture and Body Mechanics   ASSESSMENT:  CLINICAL IMPRESSION: [name] reports she has not fully attempted the neck HEP as she decided to take the weekend off from exercises, therefore today's session initiated with review of HEP exercises. Able to add yellow TB resistance with scap retraction + shoulder rows, extension and ER but limited scapular motion evident due to continued kyphotic posture. Pt reports she is unable to lay on her stomach due discomfort with pressure on her belly, therefore worked on thoracolumbar extension with elbow support on wall in addition to cervicothoracic extension in sitting. Cesily will continue to benefit skilled PT to improve core and postural strength and awareness to improve neck and back alignment for reduced pain.  OBJECTIVE IMPAIRMENTS: decreased activity tolerance, decreased endurance, decreased knowledge of condition, decreased mobility, difficulty walking, decreased ROM, decreased strength, dizziness, hypomobility, increased fascial restrictions, impaired perceived functional ability, increased muscle spasms, impaired flexibility, impaired sensation, impaired UE functional use, improper body mechanics, postural dysfunction, and pain.   ACTIVITY LIMITATIONS: carrying, lifting, bending, sitting, standing, squatting, sleeping, stairs, transfers, bed mobility, bathing, toileting, dressing,  reach over head, and locomotion level  PARTICIPATION LIMITATIONS: meal prep, cleaning, laundry, driving, shopping, community activity, and yard work  PERSONAL FACTORS: Age, Fitness, Past/current experiences, Time since onset of injury/illness/exacerbation, and 3+ comorbidities: Extensive PMH including chronic back and neck pain, OA, osteoporosis, DM, HTN, h/o TIA, diabetic peripheral neuropathy, PVD, headaches, sleep dysfunction, anxiety (refer to problem list and past medical/surgical history for full PMH)  are also affecting patient's functional outcome.   REHAB POTENTIAL: Good  CLINICAL DECISION MAKING: Evolving/moderate complexity  EVALUATION COMPLEXITY: Moderate   GOALS: Goals reviewed with patient? Yes  SHORT TERM GOALS: Target date: 10/19/2021   Patient will be independent with initial HEP to improve outcomes and carryover.  Baseline:  Goal status: MET  10/17/21  2.  Patient will report centralization of radicular symptoms.  Baseline:  Goal status: IN PROGRESS  10/17/21 - Pt reports radicular pain now only intermittent and less intense when present - mostly in R thigh but still occasionally to calf  LONG TERM GOALS: Target date: 11/16/2021, revised to 12/13/21 as of re-eval for neck    Patient will be independent with ongoing/advanced HEP for self-management at home.  Baseline:  Goal status: IN PROGRESS   2.  Patient will report 50-75% improvement in low back pain to improve QOL.  Baseline:  Goal status: MET  10/17/21 - Pt reports 70% reduction in pain  3.  Patient to demonstrate ability to achieve and maintain good spinal alignment/posturing and body mechanics needed for daily activities. Baseline:  Goal status: IN PROGRESS  10/17/21 - education provided last visit and pt denies any questions/concerns  4.  Patient will demonstrate functional pain free lumbar ROM to perform ADLs.   Baseline:  Goal status: PARTIALLY MET  10/17/21 - Lumbar ROM improving w/o pain reported  5.   Patient will demonstrate improved B LE strength to >/= 4/5 for improved stability and ease of mobility . Baseline:  Goal status: PARTIALLY  MET  10/17/21 - Refer to above MMT table  6.  Patient will report 17 on lumbar FOTO to demonstrate improved functional ability.  Baseline: 28 Goal status: IN PROGRESS   7.  Patient will tolerate 20-30 min of sitting w/o increased pain to improve comfort with eating meals or driving. Baseline:  Goal status: IN PROGRESS  8.  Patient will report 50-75% improvement in neck pain to improve QOL.  Baseline:  Goal status: IN PROGRESS  9  Patient will demonstrate functional pain free cervical ROM for safety with driving.  Baseline:  Goal status: IN PROGRESS  10.  Patient will demonstrate improved posture to decrease muscle imbalance. Baseline:  Goal status: IN PROGRESS  11. Patient will report at least 15% improvement on NDI to demonstrate improved functional ability.    Baseline: 25 / 50 = 50.0% Goal status: IN PROGRESS   12. Patient to report ability to perform ADLs, household, and leisure activities without limitation due to neck pain, LOM or weakness. Baseline:  Goal status: IN PROGRESS   PLAN: PT FREQUENCY: 2x/week  PT DURATION: 6 weeks  PLANNED INTERVENTIONS: Therapeutic exercises, Therapeutic activity, Neuromuscular re-education, Balance training, Gait training, Patient/Family education, Self Care, Joint mobilization, Stair training, Aquatic Therapy, Dry Needling, Electrical stimulation, Spinal mobilization, Cryotherapy, Moist heat, Taping, Traction, Ultrasound, Ionotophoresis 10m/ml Dexamethasone, Manual therapy, and Re-evaluation.  PLAN FOR NEXT SESSION:  reassess goals; gently progress postural and lumbopelvic flexibility and strengthening - update HEP as indicated; MT +/- DN to address pain and abnormal muscle tension; modalities PRN for pain; review of posture and body mechanics education PRN   JPercival Spanish PT 11/07/2021, 5:55 PM

## 2021-11-10 ENCOUNTER — Encounter: Payer: Self-pay | Admitting: Physical Therapy

## 2021-11-10 ENCOUNTER — Ambulatory Visit: Payer: Medicare Other | Admitting: Physical Therapy

## 2021-11-10 DIAGNOSIS — M542 Cervicalgia: Secondary | ICD-10-CM

## 2021-11-10 DIAGNOSIS — M62830 Muscle spasm of back: Secondary | ICD-10-CM

## 2021-11-10 DIAGNOSIS — M6281 Muscle weakness (generalized): Secondary | ICD-10-CM | POA: Diagnosis not present

## 2021-11-10 DIAGNOSIS — M5412 Radiculopathy, cervical region: Secondary | ICD-10-CM

## 2021-11-10 DIAGNOSIS — R293 Abnormal posture: Secondary | ICD-10-CM

## 2021-11-10 DIAGNOSIS — M5459 Other low back pain: Secondary | ICD-10-CM | POA: Diagnosis not present

## 2021-11-10 DIAGNOSIS — R262 Difficulty in walking, not elsewhere classified: Secondary | ICD-10-CM

## 2021-11-10 DIAGNOSIS — R2689 Other abnormalities of gait and mobility: Secondary | ICD-10-CM

## 2021-11-10 DIAGNOSIS — R252 Cramp and spasm: Secondary | ICD-10-CM

## 2021-11-10 NOTE — Therapy (Signed)
OUTPATIENT PHYSICAL THERAPY TREATMENT   Patient Name: RUIE SENDEJO MRN: [id number] DOB:Aug 09, 1940, 81 y.o., female Today's Date: 11/10/2021     PT End of Session - 11/10/21 1359     Visit Number 13    Date for PT Re-Evaluation 12/13/21    Authorization Type Medicare    Progress Note Due on Visit 20   Re-Eval for neck on visit #10 - 11/01/21   PT Start Time 1359    PT Stop Time 1441    PT Time Calculation (min) 42 min    Activity Tolerance Patient tolerated treatment well    Behavior During Therapy [facility] for tasks assessed/performed                    Past Medical History:  Diagnosis Date   Abdominal pain in female 03/18/2010   Qualifier: Diagnosis of  By: Carlean Purl MD, Tonna Boehringer E    Anemia 06/08/2014   Anxiety    Arthritis    Spinal Osteoarthritis   Cancer ([place])    Carcinoid tumor of stomach    Cataract    Chest pain    Myoview 12/15 no ischemia.   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 ([place]) 09/05/2006   Qualifier: Diagnosis of  By: Marca Ancona RMA, Lucy     Diabetic peripheral neuropathy ([place]) 10/29/2013   Encounter for preventative adult health care exam with abnormal findings 09/14/2013   Esophageal reflux    Gastric polyp    Fundic Gland   Gastroparesis    Headache(784.0)    Heart murmur    Echocardiogram 2/11: EF 60-65%, mild LAE, grade 1 diastolic dysfunction, aortic valve sclerosis, mean gradient 9 mm of mercury, PASP 34   Hematuria 03/16/2016   Iron deficiency anemia, unspecified    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   Leg swelling    bilateral   Neck pain 04/22/2015   PONV (postoperative nausea and vomiting)    pt states only needs small amount of anesthesia   PSVT (paroxysmal supraventricular tachycardia)    Pure hypercholesterolemia    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   Renal insufficiency 06/18/2019   Stroke ([place])    tia, 2014   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   Type II or  unspecified type diabetes mellitus without mention of complication, not stated as uncontrolled    Unspecified essential hypertension    Unspecified hereditary and idiopathic peripheral neuropathy 10/29/2013   Past Surgical History:  Procedure Laterality Date   CHOLECYSTECTOMY  1993   COLONOSCOPY  11/11/2010   diverticulosis   DILATATION & CURRETTAGE/HYSTEROSCOPY WITH RESECTOCOPE N/A 02/25/2013   Procedure: Attempted hysteroscopy with uterine perforation;  Surgeon: Jamey Reas de Berton Lan, MD;  Location: [facility];  Service: Gynecology;  Laterality: N/A;   ESOPHAGOGASTRODUODENOSCOPY  08/29/2010; 09/15/2010   Carcinoid tumor less than 1 cm in July 2012 not seen in August 2012 , gastritis, fundic gland polyps   ESOPHAGOGASTRODUODENOSCOPY  05/16/2011   ESOPHAGOGASTRODUODENOSCOPY  06/14/2012   EUS  12/15/2010   Procedure: UPPER ENDOSCOPIC ULTRASOUND (EUS) LINEAR;  Surgeon: Owens Loffler, MD;  Location: [facility];  Service: Endoscopy;  Laterality: N/A;   EYE SURGERY Bilateral    Bi lateral cateracts and bi lateral laser   LAPAROSCOPY N/A 02/25/2013   Procedure: Cystoscopy and laparoscopy with fulguration of uterine serosa;  Surgeon: Jamey Reas de Berton Lan, MD;  Location: 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   Renal insufficiency 06/18/2019   Educated about COVID-19 virus infection 05/15/2019   Atrophic vaginitis 01/20/2019   Stenosis of carotid artery 11/12/2018   Anxiety 10/13/2018   Thrush 12/07/2017   Sinusitis 10/11/2017   Headache 07/12/2017   Dizzy spells 11/21/2016   Constipation 11/21/2016    Nonrheumatic aortic valve stenosis 05/23/2016   Aortic atherosclerosis ([facility]) 05/23/2016   Hematuria 03/16/2016   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   Pain of upper abdomen 06/06/2015   Neck pain 04/22/2015   Ear pain 02/28/2015   Abnormal urine 11/21/2014   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/2014   Anemia 06/08/2014   RLS (restless legs syndrome) 11/23/2013   Diabetic peripheral neuropathy ([place]) 10/29/2013   Left-sided thoracic back pain 10/06/2013   Encounter for preventative adult health care exam with abnormal findings 09/14/2013   Iron deficiency anemia    Status post laparoscopy 02/25/2013   Hyponatremia 01/09/2013   GERD (gastroesophageal reflux disease) 01/09/2013   Amaurosis fugax of left eye 10/16/2012   Low back pain 06/03/2012   Vitamin D deficiency 03/11/2012   Bilateral hand pain 10/20/2011   Encounter for long-term (current) use of other medications 10/20/2011   IBS (irritable bowel syndrome) 08/14/2011   TIA (transient ischemic attack) 02/10/2011   Abnormal brain CT 01/19/2011   Allergic rhinitis 10/01/2010   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   FUNDIC GLAND POLYPS OF STOMACH 03/18/2010   Abdominal pain in female 03/18/2010   Pain in joint 11/94/1740   SYSTOLIC MURMUR 81/44/8185   Paroxysmal supraventricular tachycardia 01/12/2009   PLANTAR FASCIITIS 06/08/2008   CHEST PAIN 05/18/2008   Gastroparesis 12/18/2007   HYPERCHOLESTEROLEMIA 06/11/2007   Diabetes mellitus type 2 in obese ([place]) 09/05/2006   Essential hypertension 09/05/2006    PCP: [name], MD  REFERRING PROVIDER: Marrian Salvage, FNP (low back pain) & [name], MD (neck pain)  REFERRING DIAG:  M54.41,G89.29 (ICD-10-CM) - Chronic right-sided low back pain with right-sided sciatica M54.2 (ICD-10-CM) - Neck pain  RATIONALE FOR EVALUATION AND TREATMENT: Rehabilitation  THERAPY DIAG:  Cervicalgia  Radiculopathy, cervical region  Abnormal  posture  Cramp and spasm  Other low back pain  Muscle spasm of back  Muscle weakness (generalized)  Difficulty in walking, not elsewhere classified  Other abnormalities of gait and mobility  ONSET DATE: acute on chronic exacerbation ~3 weeks ago; neck pain - chronic   NEXT MD VISIT: 03/28/22 with PCP   SUBJECTIVE:  SUBJECTIVE STATEMENT: Pt requesting to take a week off from PT so that she can take care of a few other things.  PAIN:  Are you having pain? Yes: NPRS scale:  5/10 Pain location: L shoulder and base of neck Pain description: stabbing Aggravating factors: looking down, sleeping on her side, lifting/carrying groceries Relieving factors: chin tuck exercises, neck stretches, heating pad  Are you having pain? No - Low back  PERTINENT HISTORY: Extensive PMH including chronic back and neck pain, OA, osteoporosis, DM, HTN, h/o TIA, diabetic peripheral neuropathy, PVD, headaches, sleep dysfunction, anxiety (refer to problem list and past medical/surgical history for full PMH)  PRECAUTIONS: None  WEIGHT BEARING RESTRICTIONS: No  FALLS:  Has patient fallen in last 6 months? No  LIVING ENVIRONMENT: Lives with: lives alone Lives in: House/apartment Stairs: No Has following equipment at home: Single point cane, Environmental consultant - 4 wheeled, shower chair, and Grab bars  OCCUPATION: Retired  PLOF: Independent and Leisure: read and play card games on her phone, has been doing her prior PT HEP recently qod since pain started up  PATIENT GOALS: "No pain."   OBJECTIVE:   DIAGNOSTIC FINDINGS:  05/25/20 - Lumbar x-ray:  FINDINGS: Decreased osseous mineralization. Stable lumbar vertebral body heights and alignment. Mild disc space narrowing is similar. Small endplate osteophytes are present at  L2-L3 and L3-L4. Mild facet hypertrophy. Aortic calcification. IMPRESSION: Mild lumbar spondylosis similar to prior study (2018).  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 Moderate foraminal stenosis on the left at C4-C5. Mild-to-moderate bilateral foraminal stenosis at C5-C6 and mild foraminal stenosis on the right at C3-C4 and C4-C5. Mild to moderate canal stenosis at C3-C4 and C4-C5. Mild canal stenosis at C5-C6.  PATIENT SURVEYS:  NDI 25 / 22 = 50.0%  (11/03/21) FOTO Lumbar = 28; predicted = 46  SCREENING FOR RED FLAGS: Bowel or bladder incontinence: No Spinal tumors: No Cauda equina syndrome: No Compression fracture: No Abdominal aneurysm: No  COGNITION:  Overall cognitive status: Within functional limits for tasks assessed     SENSATION: Light touch: Impaired  numbness in B feet  MUSCLE LENGTH: Hamstrings: mod tight R>L ITB: mod tight R>L Piriformis: mod tight B Hip flexors: NT Quads: NT Heelcord: NT  POSTURE:  rounded shoulders, forward head, decreased lumbar lordosis, and increased thoracic kyphosis  PALPATION: Increased muscle tension and TTP over lumbar paraspinals and R glutes/piriformis and lateral hip  LUMBAR ROM:   Active  AROM  Eval - 09/21/21  10/17/21  Flexion Hands level with knees Hands to mid shins  Extension 90% limited 75% limited  Right lateral flexion Hand to mid thigh Hand to lateral knee  Left lateral flexion Hand to mid thigh Hand to lateral knee  Right rotation 80% limited 50% limited  Left rotation 80% limited 50% limited    LOWER EXTREMITY ROM:     Mildly limited primarily due to muscle tightness  LOWER EXTREMITY MMT:    MMT Right eval Left eval Right 10/17/21 Left  10/17/21  Hip flexion 3+ 3+ 3+ 3+  Hip extension 3- 3- 3 3-  Hip abduction 4- 3+ 4 4-  Hip adduction 3+ 3+ 3+ 3+  Hip internal rotation 3+ 3+ 4+ 4+  Hip external rotation 3+ 3+ 4- 4-  Knee flexion 4- 4- 4+ 4+  Knee  extension 4- 4- 4 4  Ankle dorsiflexion 3+ 3+ 4- 4-  Ankle plantarflexion 3- 3-    Ankle inversion  Ankle eversion       (Blank rows = not tested)  LUMBAR SPECIAL TESTS:  Straight leg raise test: Positive on Right  CERVICAL ROM:   Active ROM AROM (deg) Eval 11/01/21  Flexion 29 - pressure  Extension 34 - pain  Right lateral flexion 20 -pain  Left lateral flexion 22  Right rotation 48  Left rotation 49 - pain   (Blank rows = not tested)  UPPER EXTREMITY ROM:  Active ROM Right Eval 11/01/21 Left Eval 11/01/21  Shoulder flexion 126 120  Shoulder extension    Shoulder abduction 109 104  Shoulder adduction    Shoulder internal rotation FIR to T12 FIR to T10  Shoulder external rotation FER to T1 FER to T1  Elbow flexion    Elbow extension    Wrist flexion    Wrist extension    Wrist ulnar deviation    Wrist radial deviation    Wrist pronation    Wrist supination     (Blank rows = not tested)  UPPER EXTREMITY MMT:  MMT Right Eval 11/01/21 Left Eval 11/01/21  Shoulder flexion 4 4-  Shoulder extension 4- 3+  Shoulder abduction 4 4-  Shoulder adduction    Shoulder internal rotation 4- 4-  Shoulder external rotation 4- 3+  Middle trapezius    Lower trapezius    Elbow flexion    Elbow extension    Wrist flexion    Wrist extension    Wrist ulnar deviation    Wrist radial deviation    Wrist pronation    Wrist supination    Grip strength     (Blank rows = not tested)  CERVICAL SPECIAL TESTS:  Spurling's test: Negative and Distraction test: Positive   TODAY'S TREATMENT:  11/10/21 THERAPEUTIC EXERCISE: to improve flexibility, strength and mobility.  Verbal and tactile cues throughout for technique. NuStep - L5 x 6 min (UE/LE) Cervical retraction in POE leaning fwd on red Pball 10 x 3" (pt uncomfortable lying prone) Seated B cervical rotation SNAGs with pillowcase 10 x 3" each Seated cervical extension with rolled pillowcase for support 10 x 3" Standing  RTB scap retraction + shoulder row 10 x 3" Standing RTB scap retraction + shoulder extension to neutral 10 x 3" Standing RTB scap retraction/depression + shoulder ER 10 x 3-5"; back along doorframe to promote scap retraction and upright posture Standing RTB scap retraction + lat pull down 10 x 3"  MANUAL THERAPY: To promote normalized muscle tension, improved flexibility, improved joint mobility, increased ROM, and reduced pain. STM/DTM, manual TPR and pin & stretch to L>R UT and LS   11/07/21 THERAPEUTIC EXERCISE: to improve flexibility, strength and mobility.  Verbal and tactile cues throughout for technique.  UBE - L1.0 x 6' (3' fwd/back) Low doorway pec stretch 2 x 30" Seated YTB scap retraction + shoulder row 10 x 3" Seated YTB scap retraction + shoulder extension to neutral 10 x 3" Seated YTB scap retraction/depression + shoulder ER 10 x 3-5" Seated thoracolumbar extension over back of chair 10 x 3" Seated cervical extension with rolled pillowcase for support 10 x 3" Seated B cervical rotation SNAGs with pillowcase 10 x 3" each Thoracolumbar extension with elbows resting on wall 10 x 3"   11/03/21 THERAPEUTIC EXERCISE: to improve flexibility, strength and mobility.  Verbal and tactile cues throughout for technique.  UBE - L1.0 x 6' (3' fwd/back) Cervical retraction 10 x 5" L/R seated UT stretch 2 x 30" L/R seated UT stretch 2 x  30" Seated scap retraction + depression 10 x 3-5" Seated scap retraction/depression + shoulder ER 10 x 3-5" Seated thoracolumbar extension over back of chair 10 x 3" Low doorway pec stretch 3 x 30" Seated cervical extension with rolled pillowcase for support 10 x 3" Seated B cervical rotation SNAGs with pillowcase 10 x 3" each   PATIENT EDUCATION:  Education details: PT eval findings, anticipated POC, and HEP update - initial cervical HEP Person educated: Patient Education method: Explanation, Demonstration, Tactile cues, Verbal cues, and  Handouts Education comprehension: verbalized understanding, returned demonstration, verbal cues required, tactile cues required, and needs further education   HOME EXERCISE PROGRAM: Access Code: [email or web address] URL: [email or web address] Date: 11/01/2021 Prepared by: Annie Paras  Exercises - Hooklying Single Knee to Chest Stretch  - 2-3 x daily - 7 x weekly - 3 reps - 30 sec hold - Supine Piriformis Stretch with Foot on Ground  - 2-3 x daily - 7 x weekly - 3 reps - 30 sec hold - Supine Lower Trunk Rotation  - 2-3 x daily - 7 x weekly - 5 reps - 10 sec hold - Supine Posterior Pelvic Tilt  - 2-3 x daily - 7 x weekly - 2 sets - 10 reps - 5 sec hold - Supine Transversus Abdominis Bracing with Leg Extension  - 1 x daily - 7 x weekly - 2 sets - 10 reps - 3 sec hold - Seated Hamstring Stretch  - 2-3 x daily - 7 x weekly - 3 reps - 30 sec hold - Seated Flexion Stretch with Swiss Ball  - 2-3 x daily - 7 x weekly - 3 reps - 30 sec hold - Seated Thoracic Flexion and Rotation with Swiss Ball  - 2-3 x daily - 7 x weekly - 2-3 reps - 30 sec hold - Modified Thomas Stretch (Mirrored)  - 2-3 x daily - 3 x weekly - 3 reps - 30 sec hold - Hooklying Single Leg Bent Knee Fallouts with Resistance  - 1 x daily - 3 x weekly - 2 sets - 10 reps - 3 sec hold - Supine March with Resistance Band  - 1 x daily - 3 x weekly - 2 sets - 10 reps - 2-3 sec hold hold - Bridge with Resistance  - 1 x daily - 3 x weekly - 2 sets - 10 reps - 5 sec hold - Standing Lumbar Extension with Counter  - 1 x daily - 7 x weekly - 2 sets - 10 reps - 3 sec hold - Beginner Clam  - 1 x daily - 3 x weekly - 2 sets - 10 reps - 3 sec hold - Standing Hip Abduction with Resistance at Ankles and Counter Support  - 1 x daily - 3 x weekly - 2 sets - 10 reps - 3 sec hold - Standing Hip Extension with Resistance at Ankles and Counter Support  - 1 x daily - 3 x weekly - 2 sets - 10 reps - 3 sec hold - Marching with Resistance  - 1 x daily - 3 x  weekly - 2 sets - 10 reps - 3 sec hold - Squat with Chair and Counter Support  - 1 x daily - 3 x weekly - 2 sets - 10 reps - 3 sec hold - Heel Raises with Counter Support  - 1 x daily - 3 x weekly - 2 sets - 10 reps - 3 sec hold - Seated Cervical Retraction  - 2 x daily -  7 x weekly - 2 sets - 10 reps - 3-5 sec hold - Seated Gentle Upper Trapezius Stretch  - 2 x daily - 7 x weekly - 3 reps - 30 sec hold - Gentle Levator Scapulae Stretch  - 2 x daily - 7 x weekly - 3 reps - 30 sec hold - Seated Scapular Retraction  - 3 x daily - 7 x weekly - 2 sets - 10 reps - 5 sec hold  Patient Education - Posture and Body Mechanics   ASSESSMENT:  CLINICAL IMPRESSION: Leondra reports her back has not been hurting recently but continues to have pain at the base of her neck and into her L shoulder. She reports the new HEP for her neck is going well and she feels that her posture is getting better. She was able to progress postural exercises today, advancing resistance to RTB and transitioning from seated to standing. Slight discomfort noted in L shoulder with attempts at lat pull down but no lingering increased pain. Pt noting MT helped with the pain at the base of her neck and in her L upper shoulder. Pt encouraged to keep up with her HEP during her week off and we will resume PT to continue to address neck +/- low back pain upon her return.  OBJECTIVE IMPAIRMENTS: decreased activity tolerance, decreased endurance, decreased knowledge of condition, decreased mobility, difficulty walking, decreased ROM, decreased strength, dizziness, hypomobility, increased fascial restrictions, impaired perceived functional ability, increased muscle spasms, impaired flexibility, impaired sensation, impaired UE functional use, improper body mechanics, postural dysfunction, and pain.   ACTIVITY LIMITATIONS: carrying, lifting, bending, sitting, standing, squatting, sleeping, stairs, transfers, bed mobility, bathing, toileting,  dressing, reach over head, and locomotion level  PARTICIPATION LIMITATIONS: meal prep, cleaning, laundry, driving, shopping, community activity, and yard work  PERSONAL FACTORS: Age, Fitness, Past/current experiences, Time since onset of injury/illness/exacerbation, and 3+ comorbidities: Extensive PMH including chronic back and neck pain, OA, osteoporosis, DM, HTN, h/o TIA, diabetic peripheral neuropathy, PVD, headaches, sleep dysfunction, anxiety (refer to problem list and past medical/surgical history for full PMH)  are also affecting patient's functional outcome.   REHAB POTENTIAL: Good  CLINICAL DECISION MAKING: Evolving/moderate complexity  EVALUATION COMPLEXITY: Moderate   GOALS: Goals reviewed with patient? Yes  SHORT TERM GOALS: Target date: 10/19/2021   Patient will be independent with initial HEP to improve outcomes and carryover.  Baseline:  Goal status: MET  10/17/21  2.  Patient will report centralization of radicular symptoms.  Baseline:  Goal status: IN PROGRESS  10/17/21 - Pt reports radicular pain now only intermittent and less intense when present - mostly in R thigh but still occasionally to calf  LONG TERM GOALS: Target date: 11/16/2021, revised to 12/13/21 as of re-eval for neck    Patient will be independent with ongoing/advanced HEP for self-management at home.  Baseline:  Goal status: IN PROGRESS 11/10/21 - Met for current HEP  2.  Patient will report 50-75% improvement in low back pain to improve QOL.  Baseline:  Goal status: MET  11/10/21 - Pt reports back pain currently resolved  3.  Patient to demonstrate ability to achieve and maintain good spinal alignment/posturing and body mechanics needed for daily activities. Baseline:  Goal status: IN PROGRESS  10/17/21 - education provided last visit and pt denies any questions/concerns  4.  Patient will demonstrate functional pain free lumbar ROM to perform ADLs.   Baseline:  Goal status: PARTIALLY MET  10/17/21  - Lumbar ROM improving w/o pain reported  5.  Patient will demonstrate improved B LE strength to >/= 4/5 for improved stability and ease of mobility . Baseline:  Goal status: PARTIALLY MET  10/17/21 - Refer to above MMT table  6.  Patient will report 85 on lumbar FOTO to demonstrate improved functional ability.  Baseline: 28 Goal status: IN PROGRESS   7.  Patient will tolerate 20-30 min of sitting w/o increased pain to improve comfort with eating meals or driving. Baseline:  Goal status: IN PROGRESS  8.  Patient will report 50-75% improvement in neck pain to improve QOL.  Baseline:  Goal status: IN PROGRESS  9  Patient will demonstrate functional pain free cervical ROM for safety with driving.  Baseline:  Goal status: IN PROGRESS  10.  Patient will demonstrate improved posture to decrease muscle imbalance. Baseline:  Goal status: MET  11/10/21 - Pt demonstrating more upright posture in sitting and standing with decreasing fwd head position  11. Patient will report at least 15% improvement on NDI to demonstrate improved functional ability.    Baseline: 25 / 50 = 50.0% Goal status: IN PROGRESS   12. Patient to report ability to perform ADLs, household, and leisure activities without limitation due to neck pain, LOM or weakness. Baseline:  Goal status: IN PROGRESS   PLAN: PT FREQUENCY: 2x/week  PT DURATION: 6 weeks  PLANNED INTERVENTIONS: Therapeutic exercises, Therapeutic activity, Neuromuscular re-education, Balance training, Gait training, Patient/Family education, Self Care, Joint mobilization, Stair training, Aquatic Therapy, Dry Needling, Electrical stimulation, Spinal mobilization, Cryotherapy, Moist heat, Taping, Traction, Ultrasound, Ionotophoresis 4mg /ml Dexamethasone, Manual therapy, and Re-evaluation.  PLAN FOR NEXT SESSION:  lumbar FOTO and ROM reassessment; LE MMT;  goal assessment; gently progress postural and lumbopelvic flexibility and strengthening - update HEP  as indicated; MT +/- DN to address pain and abnormal muscle tension; modalities PRN for pain; review of posture and body mechanics education PRN   Percival Spanish, PT 11/10/2021, 3:01 PM

## 2021-11-14 ENCOUNTER — Encounter: Payer: Medicare Other | Admitting: Physical Therapy

## 2021-11-22 ENCOUNTER — Ambulatory Visit: Payer: Medicare Other | Admitting: Physical Therapy

## 2021-11-22 DIAGNOSIS — R2689 Other abnormalities of gait and mobility: Secondary | ICD-10-CM

## 2021-11-22 DIAGNOSIS — M5412 Radiculopathy, cervical region: Secondary | ICD-10-CM | POA: Diagnosis not present

## 2021-11-22 DIAGNOSIS — M542 Cervicalgia: Secondary | ICD-10-CM | POA: Diagnosis not present

## 2021-11-22 DIAGNOSIS — R262 Difficulty in walking, not elsewhere classified: Secondary | ICD-10-CM

## 2021-11-22 DIAGNOSIS — R252 Cramp and spasm: Secondary | ICD-10-CM

## 2021-11-22 DIAGNOSIS — R293 Abnormal posture: Secondary | ICD-10-CM

## 2021-11-22 DIAGNOSIS — M6281 Muscle weakness (generalized): Secondary | ICD-10-CM

## 2021-11-22 DIAGNOSIS — M62830 Muscle spasm of back: Secondary | ICD-10-CM | POA: Diagnosis not present

## 2021-11-22 DIAGNOSIS — M5459 Other low back pain: Secondary | ICD-10-CM | POA: Diagnosis not present

## 2021-11-22 NOTE — Therapy (Signed)
OUTPATIENT PHYSICAL THERAPY TREATMENT   Patient Name: [name] MRN: [id number] DOB:1940/06/30, 81 y.o., female Today's Date: 11/22/2021     PT End of Session - 11/22/21 1446     Visit Number 14    Date for PT Re-Evaluation 12/13/21    Authorization Type Medicare    Progress Note Due on Visit 20   Re-Eval for neck on visit #10 - 11/01/21   PT Start Time 1446    PT Stop Time 1534    PT Time Calculation (min) 48 min    Activity Tolerance Patient tolerated treatment well    Behavior During Therapy [facility] for tasks assessed/performed                     Past Medical History:  Diagnosis Date   Abdominal pain in female 03/18/2010   Qualifier: Diagnosis of  By: Carlean Purl MD, Tonna Boehringer E    Anemia 06/08/2014   Anxiety    Arthritis    Spinal Osteoarthritis   Cancer ([place])    Carcinoid tumor of stomach    Cataract    Chest pain    Myoview 12/15 no ischemia.   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 ([facility]) 09/05/2006   Qualifier: Diagnosis of  By: Marca Ancona RMA, Lucy     Diabetic peripheral neuropathy ([place]) 10/29/2013   Encounter for preventative adult health care exam with abnormal findings 09/14/2013   Esophageal reflux    Gastric polyp    Fundic Gland   Gastroparesis    Headache(784.0)    Heart murmur    Echocardiogram 2/11: EF 60-65%, mild LAE, grade 1 diastolic dysfunction, aortic valve sclerosis, mean gradient 9 mm of mercury, PASP 34   Hematuria 03/16/2016   Iron deficiency anemia, unspecified    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   Leg swelling    bilateral   Neck pain 04/22/2015   PONV (postoperative nausea and vomiting)    pt states only needs small amount of anesthesia   PSVT (paroxysmal supraventricular tachycardia)    Pure hypercholesterolemia    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   Renal insufficiency 06/18/2019   Stroke ([place])    tia, 2014   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   Type II or  unspecified type diabetes mellitus without mention of complication, not stated as uncontrolled    Unspecified essential hypertension    Unspecified hereditary and idiopathic peripheral neuropathy 10/29/2013   Past Surgical History:  Procedure Laterality Date   CHOLECYSTECTOMY  1993   COLONOSCOPY  11/11/2010   diverticulosis   DILATATION & CURRETTAGE/HYSTEROSCOPY WITH RESECTOCOPE N/A 02/25/2013   Procedure: Attempted hysteroscopy with uterine perforation;  Surgeon: Jamey Reas de Berton Lan, MD;  Location: [facility];  Service: Gynecology;  Laterality: N/A;   ESOPHAGOGASTRODUODENOSCOPY  08/29/2010; 09/15/2010   Carcinoid tumor less than 1 cm in July 2012 not seen in August 2012 , gastritis, fundic gland polyps   ESOPHAGOGASTRODUODENOSCOPY  05/16/2011   ESOPHAGOGASTRODUODENOSCOPY  06/14/2012   EUS  12/15/2010   Procedure: UPPER ENDOSCOPIC ULTRASOUND (EUS) LINEAR;  Surgeon: Owens Loffler, MD;  Location: [facility];  Service: Endoscopy;  Laterality: N/A;   EYE SURGERY Bilateral    Bi lateral cateracts and bi lateral laser   LAPAROSCOPY N/A 02/25/2013   Procedure: Cystoscopy and laparoscopy with fulguration of uterine serosa;  Surgeon: Jamey Reas de Berton Lan, MD;  Location: [facility];  Service: Gynecology;  Laterality: N/A;   TONSILLECTOMY     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   Renal insufficiency 06/18/2019   Educated about COVID-19 virus infection 05/15/2019   Atrophic vaginitis 01/20/2019   Stenosis of carotid artery 11/12/2018   Anxiety 10/13/2018   Thrush 12/07/2017   Sinusitis 10/11/2017   Headache 07/12/2017   Dizzy spells 11/21/2016   Constipation 11/21/2016    Nonrheumatic aortic valve stenosis 05/23/2016   Aortic atherosclerosis ([facility]) 05/23/2016   Hematuria 03/16/2016   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   Pain of upper abdomen 06/06/2015   Neck pain 04/22/2015   Ear pain 02/28/2015   Abnormal urine 11/21/2014   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/10/2014   Anemia 06/08/2014   RLS (restless legs syndrome) 11/23/2013   Diabetic peripheral neuropathy ([place]) 10/29/2013   Left-sided thoracic back pain 10/06/2013   Encounter for preventative adult health care exam with abnormal findings 09/14/2013   Iron deficiency anemia    Status post laparoscopy 02/25/2013   Hyponatremia 01/09/2013   GERD (gastroesophageal reflux disease) 01/09/2013   Amaurosis fugax of left eye 10/16/2012   Low back pain 06/03/2012   Vitamin D deficiency 03/11/2012   Bilateral hand pain 10/20/2011   Encounter for long-term (current) use of other medications 10/20/2011   IBS (irritable bowel syndrome) 08/14/2011   TIA (transient ischemic attack) 02/10/2011   Abnormal brain CT 01/19/2011   Allergic rhinitis 10/01/2010   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   FUNDIC GLAND POLYPS OF STOMACH 03/18/2010   Abdominal pain in female 03/18/2010   Pain in joint 11/94/1740   SYSTOLIC MURMUR 81/44/8185   Paroxysmal supraventricular tachycardia 01/12/2009   PLANTAR FASCIITIS 06/08/2008   CHEST PAIN 05/18/2008   Gastroparesis 12/18/2007   HYPERCHOLESTEROLEMIA 06/11/2007   Diabetes mellitus type 2 in obese ([place]) 09/05/2006   Essential hypertension 09/05/2006    PCP: [name], MD  REFERRING PROVIDER: Marrian Salvage, FNP (low back pain) & [name], MD (neck pain)  REFERRING DIAG:  M54.41,G89.29 (ICD-10-CM) - Chronic right-sided low back pain with right-sided sciatica M54.2 (ICD-10-CM) - Neck pain  RATIONALE FOR EVALUATION AND TREATMENT: Rehabilitation  THERAPY DIAG:  Cervicalgia  Radiculopathy, cervical region  Abnormal  posture  Cramp and spasm  Other low back pain  Muscle spasm of back  Muscle weakness (generalized)  Difficulty in walking, not elsewhere classified  Other abnormalities of gait and mobility  ONSET DATE: acute on chronic exacerbation ~3 weeks ago; neck pain - chronic   NEXT MD VISIT: 03/28/22 with PCP   SUBJECTIVE:  SUBJECTIVE STATEMENT: Pt reports she took it easy last week - no pain in her back and only mild neck pain.  PAIN:  Are you having pain? Yes: NPRS scale:  2/10 Pain location: L shoulder and base of neck Pain description: discomfort  Are you having pain? No - Low back  PERTINENT HISTORY: Extensive PMH including chronic back and neck pain, OA, osteoporosis, DM, HTN, h/o TIA, diabetic peripheral neuropathy, PVD, headaches, sleep dysfunction, anxiety (refer to problem list and past medical/surgical history for full PMH)  PRECAUTIONS: None  WEIGHT BEARING RESTRICTIONS: No  FALLS:  Has patient fallen in last 6 months? No  LIVING ENVIRONMENT: Lives with: lives alone Lives in: House/apartment Stairs: No Has following equipment at home: Single point cane, Environmental consultant - 4 wheeled, shower chair, and Grab bars  OCCUPATION: Retired  PLOF: Independent and Leisure: read and play card games on her phone, has been doing her prior PT HEP recently qod since pain started up  PATIENT GOALS: "No pain."   OBJECTIVE:   DIAGNOSTIC FINDINGS:  05/25/20 - Lumbar x-ray:  FINDINGS: Decreased osseous mineralization. Stable lumbar vertebral body heights and alignment. Mild disc space narrowing is similar. Small endplate osteophytes are present at L2-L3 and L3-L4. Mild facet hypertrophy. Aortic calcification. IMPRESSION: Mild lumbar spondylosis similar to prior study (2018).  08/30/20 - Cervical  MRI: IMPRESSION: Severe foraminal stenosis on the left at C3-C4. Moderate foraminal stenosis on the left at C4-C5. Mild-to-moderate bilateral foraminal stenosis at C5-C6 and mild foraminal stenosis on the right at C3-C4 and C4-C5. Mild to moderate canal stenosis at C3-C4 and C4-C5. Mild canal stenosis at C5-C6.  PATIENT SURVEYS:  NDI 25 / 49 = 50.0%  (11/03/21) FOTO Lumbar = 28; predicted = 46  SCREENING FOR RED FLAGS: Bowel or bladder incontinence: No Spinal tumors: No Cauda equina syndrome: No Compression fracture: No Abdominal aneurysm: No  COGNITION:  Overall cognitive status: Within functional limits for tasks assessed     SENSATION: Light touch: Impaired  numbness in B feet  MUSCLE LENGTH: Hamstrings: mod tight R>L ITB: mod tight R>L Piriformis: mod tight B Hip flexors: NT Quads: NT Heelcord: NT  POSTURE:  rounded shoulders, forward head, decreased lumbar lordosis, and increased thoracic kyphosis  PALPATION: Increased muscle tension and TTP over lumbar paraspinals and R glutes/piriformis and lateral hip  LUMBAR ROM:   Active  AROM  Eval - 09/21/21  10/17/21  11/22/21  Flexion Hands level with knees Hands to mid shins Hands to lower shins  Extension 90% limited 75% limited 25% limited  Right lateral flexion Hand to mid thigh Hand to lateral knee Hand to lateral knee  Left lateral flexion Hand to mid thigh Hand to lateral knee Hand to lateral knee  Right rotation 80% limited 50% limited 25% limited  Left rotation 80% limited 50% limited 25% limited    LOWER EXTREMITY ROM:     Mildly limited primarily due to muscle tightness  LOWER EXTREMITY MMT:    MMT Right eval Left eval Right 10/17/21 Left  10/17/21 Right 11/22/21 Left 11/22/21  Hip flexion 3+ 3+ 3+ 3+ 4 4  Hip extension 3- 3- 3 3- 4 4  Hip abduction 4- 3+ 4 4- 4+ 4+  Hip adduction 3+ 3+ 3+ 3+ 4+ 4+  Hip internal rotation 3+ 3+ 4+ 4+ 5 5  Hip external rotation 3+ 3+ 4- 4- 4 4  Knee flexion 4- 4- 4+  4+ 5 5  Knee extension 4- 4- $Rem'4 4 5 5  'GBYk$ Ankle  dorsiflexion 3+ 3+ 4- 4- 4+ 4+  Ankle plantarflexion 3- 3-   4 10 SLS HR 4 10 SLS HR  Ankle inversion        Ankle eversion         (Blank rows = not tested)  LUMBAR SPECIAL TESTS:  Straight leg raise test: Positive on Right  CERVICAL ROM:   Active ROM AROM (deg) Eval 11/01/21   11/22/21  Flexion 29 - pressure 38  Extension 34 - pain 38  Right lateral flexion 20 - pain 26  Left lateral flexion 22 24  Right rotation 48 64  Left rotation 49 - pain 57   (Blank rows = not tested)  UPPER EXTREMITY ROM:  Active ROM Right Eval 11/01/21 Left Eval 11/01/21  Shoulder flexion 126 120  Shoulder extension    Shoulder abduction 109 104  Shoulder adduction    Shoulder internal rotation FIR to T12 FIR to T10  Shoulder external rotation FER to T1 FER to T1  Elbow flexion    Elbow extension    Wrist flexion    Wrist extension    Wrist ulnar deviation    Wrist radial deviation    Wrist pronation    Wrist supination     (Blank rows = not tested)  UPPER EXTREMITY MMT:  MMT Right Eval 11/01/21 Left Eval 11/01/21  Shoulder flexion 4 4-  Shoulder extension 4- 3+  Shoulder abduction 4 4-  Shoulder adduction    Shoulder internal rotation 4- 4-  Shoulder external rotation 4- 3+  Middle trapezius    Lower trapezius    Elbow flexion    Elbow extension    Wrist flexion    Wrist extension    Wrist ulnar deviation    Wrist radial deviation    Wrist pronation    Wrist supination    Grip strength     (Blank rows = not tested)  CERVICAL SPECIAL TESTS:  Spurling's test: Negative and Distraction test: Positive   TODAY'S TREATMENT:  11/22/21 THERAPEUTIC EXERCISE: to improve flexibility, strength and mobility.  Verbal and tactile cues throughout for technique. NuStep - L5 x 6 min (UE/LE) Standing RTB scap retraction + shoulder row 10 x 3" Standing RTB scap retraction + shoulder extension to neutral 10 x 3" Standing RTB scap  retraction/depression + shoulder ER 10 x 3-5"; back along doorframe to promote scap retraction and upright posture  THERAPEUTIC ACTIVITIES: Lumbar FOTO = 69  Lumbar & cervical ROM LE MMT   11/10/21 THERAPEUTIC EXERCISE: to improve flexibility, strength and mobility.  Verbal and tactile cues throughout for technique. NuStep - L5 x 6 min (UE/LE) Cervical retraction in POE leaning fwd on red Pball 10 x 3" (pt uncomfortable lying prone) Seated B cervical rotation SNAGs with pillowcase 10 x 3" each Seated cervical extension with rolled pillowcase for support 10 x 3" Standing RTB scap retraction + shoulder row 10 x 3" Standing RTB scap retraction + shoulder extension to neutral 10 x 3" Standing RTB scap retraction/depression + shoulder ER 10 x 3-5"; back along doorframe to promote scap retraction and upright posture Standing RTB scap retraction + lat pull down 10 x 3"  MANUAL THERAPY: To promote normalized muscle tension, improved flexibility, improved joint mobility, increased ROM, and reduced pain. STM/DTM, manual TPR and pin & stretch to L>R UT and LS   11/07/21 THERAPEUTIC EXERCISE: to improve flexibility, strength and mobility.  Verbal and tactile cues throughout for technique.  UBE - L1.0 x 6' (3' fwd/back)  Low doorway pec stretch 2 x 30" Seated YTB scap retraction + shoulder row 10 x 3" Seated YTB scap retraction + shoulder extension to neutral 10 x 3" Seated YTB scap retraction/depression + shoulder ER 10 x 3-5" Seated thoracolumbar extension over back of chair 10 x 3" Seated cervical extension with rolled pillowcase for support 10 x 3" Seated B cervical rotation SNAGs with pillowcase 10 x 3" each Thoracolumbar extension with elbows resting on wall 10 x 3"   PATIENT EDUCATION:  Education details: PT eval findings, anticipated POC, and HEP update - initial cervical HEP Person educated: Patient Education method: Explanation, Demonstration, Tactile cues, Verbal cues, and  Handouts Education comprehension: verbalized understanding, returned demonstration, verbal cues required, tactile cues required, and needs further education   HOME EXERCISE PROGRAM: Access Code: [email or web address] URL: [email or web address] Date: 11/01/2021 Prepared by: Annie Paras  Exercises - Hooklying Single Knee to Chest Stretch  - 2-3 x daily - 7 x weekly - 3 reps - 30 sec hold - Supine Piriformis Stretch with Foot on Ground  - 2-3 x daily - 7 x weekly - 3 reps - 30 sec hold - Supine Lower Trunk Rotation  - 2-3 x daily - 7 x weekly - 5 reps - 10 sec hold - Supine Posterior Pelvic Tilt  - 2-3 x daily - 7 x weekly - 2 sets - 10 reps - 5 sec hold - Supine Transversus Abdominis Bracing with Leg Extension  - 1 x daily - 7 x weekly - 2 sets - 10 reps - 3 sec hold - Seated Hamstring Stretch  - 2-3 x daily - 7 x weekly - 3 reps - 30 sec hold - Seated Flexion Stretch with Swiss Ball  - 2-3 x daily - 7 x weekly - 3 reps - 30 sec hold - Seated Thoracic Flexion and Rotation with Swiss Ball  - 2-3 x daily - 7 x weekly - 2-3 reps - 30 sec hold - Modified Thomas Stretch (Mirrored)  - 2-3 x daily - 3 x weekly - 3 reps - 30 sec hold - Hooklying Single Leg Bent Knee Fallouts with Resistance  - 1 x daily - 3 x weekly - 2 sets - 10 reps - 3 sec hold - Supine March with Resistance Band  - 1 x daily - 3 x weekly - 2 sets - 10 reps - 2-3 sec hold hold - Bridge with Resistance  - 1 x daily - 3 x weekly - 2 sets - 10 reps - 5 sec hold - Standing Lumbar Extension with Counter  - 1 x daily - 7 x weekly - 2 sets - 10 reps - 3 sec hold - Beginner Clam  - 1 x daily - 3 x weekly - 2 sets - 10 reps - 3 sec hold - Standing Hip Abduction with Resistance at Ankles and Counter Support  - 1 x daily - 3 x weekly - 2 sets - 10 reps - 3 sec hold - Standing Hip Extension with Resistance at Ankles and Counter Support  - 1 x daily - 3 x weekly - 2 sets - 10 reps - 3 sec hold - Marching with Resistance  - 1 x daily - 3 x  weekly - 2 sets - 10 reps - 3 sec hold - Squat with Chair and Counter Support  - 1 x daily - 3 x weekly - 2 sets - 10 reps - 3 sec hold - Heel Raises with Counter Support  - 1 x  daily - 3 x weekly - 2 sets - 10 reps - 3 sec hold - Seated Cervical Retraction  - 2 x daily - 7 x weekly - 2 sets - 10 reps - 3-5 sec hold - Seated Gentle Upper Trapezius Stretch  - 2 x daily - 7 x weekly - 3 reps - 30 sec hold - Gentle Levator Scapulae Stretch  - 2 x daily - 7 x weekly - 3 reps - 30 sec hold - Seated Scapular Retraction  - 3 x daily - 7 x weekly - 2 sets - 10 reps - 5 sec hold  Patient Education - Posture and Body Mechanics   ASSESSMENT:  CLINICAL IMPRESSION: [place] reports her back pain has remained well controlled and her neck pain has decreased to only 2/10. She feels that the HEP exercises have been helping. Completed assessment of goals related to her LBP with all goals now met and FOTO exceeding predicted value. As such we will shift focus for remaining POC to continue to address her remaining deficits related to her neck pain. She is more consistently demonstrating good postural awareness and her cervical AROM has improved in all planes. Reviewed postural strengthening with fewer cues needed for postural alignment. Pt inquiring about how much longer she needs to continue with PT - pt informed that we can transition to her HEP once she feels she has reached a point where she can manage her pain with her HEP.  OBJECTIVE IMPAIRMENTS: decreased activity tolerance, decreased endurance, decreased knowledge of condition, decreased mobility, difficulty walking, decreased ROM, decreased strength, dizziness, hypomobility, increased fascial restrictions, impaired perceived functional ability, increased muscle spasms, impaired flexibility, impaired sensation, impaired UE functional use, improper body mechanics, postural dysfunction, and pain.   ACTIVITY LIMITATIONS: carrying, lifting, bending, sitting,  standing, squatting, sleeping, stairs, transfers, bed mobility, bathing, toileting, dressing, reach over head, and locomotion level  PARTICIPATION LIMITATIONS: meal prep, cleaning, laundry, driving, shopping, community activity, and yard work  PERSONAL FACTORS: Age, Fitness, Past/current experiences, Time since onset of injury/illness/exacerbation, and 3+ comorbidities: Extensive PMH including chronic back and neck pain, OA, osteoporosis, DM, HTN, h/o TIA, diabetic peripheral neuropathy, PVD, headaches, sleep dysfunction, anxiety (refer to problem list and past medical/surgical history for full PMH)  are also affecting patient's functional outcome.   REHAB POTENTIAL: Good  CLINICAL DECISION MAKING: Evolving/moderate complexity  EVALUATION COMPLEXITY: Moderate   GOALS: Goals reviewed with patient? Yes  SHORT TERM GOALS: Target date: 10/19/2021   Patient will be independent with initial HEP to improve outcomes and carryover.  Baseline:  Goal status: MET  10/17/21  2.  Patient will report centralization of radicular symptoms.  Baseline:  Goal status: IN PROGRESS  10/17/21 - Pt reports radicular pain now only intermittent and less intense when present - mostly in R thigh but still occasionally to calf  LONG TERM GOALS: Target date: 11/16/2021, revised to 12/13/21 as of re-eval for neck    Patient will be independent with ongoing/advanced HEP for self-management at home.  Baseline:  Goal status: IN PROGRESS 11/10/21 - Met for current HEP  2.  Patient will report 50-75% improvement in low back pain to improve QOL.  Baseline:  Goal status: MET  11/10/21 - Pt reports back pain currently resolved  3.  Patient to demonstrate ability to achieve and maintain good spinal alignment/posturing and body mechanics needed for daily activities. Baseline:  Goal status: MET  11/22/21  4.  Patient will demonstrate functional pain free lumbar ROM to perform ADLs.  Baseline:  Goal status: MET  11/22/21 -  Lumbar ROM WFL w/o pain reported  5.  Patient will demonstrate improved B LE strength to >/= 4/5 for improved stability and ease of mobility . Baseline:  Goal status: MET  11/22/21 - Refer to above MMT table  6.  Patient will report 85 on lumbar FOTO to demonstrate improved functional ability.  Baseline: 28 Goal status: MET  11/22/21 - Lumbar FOTO = 69   7.  Patient will tolerate 20-30 min of sitting w/o increased pain to improve comfort with eating meals or driving. Baseline:  Goal status: MET  11/22/21 - able to sit for up to 1 hr w/o increased pain  8.  Patient will report 50-75% improvement in neck pain to improve QOL.  Baseline:  Goal status: MET  11/22/21 - Pt reports 80% improvement in pain  9  Patient will demonstrate functional pain free cervical ROM for safety with driving.  Baseline:  Goal status: IN PROGRESS  11/22/21 - Cervical ROM improving in all planes  10.  Patient will demonstrate improved posture to decrease muscle imbalance. Baseline:  Goal status: MET  11/10/21 - Pt demonstrating more upright posture in sitting and standing with decreasing fwd head position  11. Patient will report at least 15% improvement on NDI to demonstrate improved functional ability.    Baseline: 25 / 50 = 50.0% Goal status: IN PROGRESS   12. Patient to report ability to perform ADLs, household, and leisure activities without limitation due to neck pain, LOM or weakness. Baseline:  Goal status: IN PROGRESS   PLAN: PT FREQUENCY: 2x/week  PT DURATION: 6 weeks  PLANNED INTERVENTIONS: Therapeutic exercises, Therapeutic activity, Neuromuscular re-education, Balance training, Gait training, Patient/Family education, Self Care, Joint mobilization, Stair training, Aquatic Therapy, Dry Needling, Electrical stimulation, Spinal mobilization, Cryotherapy, Moist heat, Taping, Traction, Ultrasound, Ionotophoresis 4mg /ml Dexamethasone, Manual therapy, and Re-evaluation.  PLAN FOR NEXT SESSION:   gently progress postural and cervical/lumbopelvic flexibility and strengthening - update HEP as indicated; MT +/- DN to address pain and abnormal muscle tension; modalities PRN for pain; review of posture and body mechanics education PRN   Percival Spanish, PT 11/22/2021, 3:49 PM

## 2021-11-29 ENCOUNTER — Ambulatory Visit: Payer: Medicare Other | Admitting: Physical Therapy

## 2021-11-29 ENCOUNTER — Encounter: Payer: Self-pay | Admitting: Physical Therapy

## 2021-11-29 DIAGNOSIS — M62830 Muscle spasm of back: Secondary | ICD-10-CM

## 2021-11-29 DIAGNOSIS — M542 Cervicalgia: Secondary | ICD-10-CM | POA: Diagnosis not present

## 2021-11-29 DIAGNOSIS — R293 Abnormal posture: Secondary | ICD-10-CM | POA: Diagnosis not present

## 2021-11-29 DIAGNOSIS — M5459 Other low back pain: Secondary | ICD-10-CM | POA: Diagnosis not present

## 2021-11-29 DIAGNOSIS — M5412 Radiculopathy, cervical region: Secondary | ICD-10-CM

## 2021-11-29 DIAGNOSIS — R262 Difficulty in walking, not elsewhere classified: Secondary | ICD-10-CM

## 2021-11-29 DIAGNOSIS — R2689 Other abnormalities of gait and mobility: Secondary | ICD-10-CM | POA: Diagnosis not present

## 2021-11-29 DIAGNOSIS — R252 Cramp and spasm: Secondary | ICD-10-CM | POA: Diagnosis not present

## 2021-11-29 DIAGNOSIS — M6281 Muscle weakness (generalized): Secondary | ICD-10-CM | POA: Diagnosis not present

## 2021-11-29 NOTE — Therapy (Signed)
OUTPATIENT PHYSICAL THERAPY TREATMENT   Patient Name: [name] MRN: [id number] DOB:05/07/1940, 81 y.o., female Today's Date: 11/29/2021     PT End of Session - 11/29/21 1534     Visit Number 15    Date for PT Re-Evaluation 12/13/21    Authorization Type Medicare    Progress Note Due on Visit 20   Re-Eval for neck on visit #10 - 11/01/21   PT Start Time 1534    PT Stop Time 1613    PT Time Calculation (min) 39 min    Activity Tolerance Patient tolerated treatment well    Behavior During Therapy [facility] for tasks assessed/performed                      Past Medical History:  Diagnosis Date   Abdominal pain in female 03/18/2010   Qualifier: Diagnosis of  By: Carlean Purl MD, Tonna Boehringer E    Anemia 06/08/2014   Anxiety    Arthritis    Spinal Osteoarthritis   Cancer ([place])    Carcinoid tumor of stomach    Cataract    Chest pain    Myoview 12/15 no ischemia.   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 ([place]) 09/05/2006   Qualifier: Diagnosis of  By: Marca Ancona RMA, Lucy     Diabetic peripheral neuropathy ([place]) 10/29/2013   Encounter for preventative adult health care exam with abnormal findings 09/14/2013   Esophageal reflux    Gastric polyp    Fundic Gland   Gastroparesis    Headache(784.0)    Heart murmur    Echocardiogram 2/11: EF 60-65%, mild LAE, grade 1 diastolic dysfunction, aortic valve sclerosis, mean gradient 9 mm of mercury, PASP 34   Hematuria 03/16/2016   Iron deficiency anemia, unspecified    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   Leg swelling    bilateral   Neck pain 04/22/2015   PONV (postoperative nausea and vomiting)    pt states only needs small amount of anesthesia   PSVT (paroxysmal supraventricular tachycardia)    Pure hypercholesterolemia    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   Renal insufficiency 06/18/2019   Stroke ([place])    tia, 2014   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   Type II or  unspecified type diabetes mellitus without mention of complication, not stated as uncontrolled    Unspecified essential hypertension    Unspecified hereditary and idiopathic peripheral neuropathy 10/29/2013   Past Surgical History:  Procedure Laterality Date   CHOLECYSTECTOMY  1993   COLONOSCOPY  11/11/2010   diverticulosis   DILATATION & CURRETTAGE/HYSTEROSCOPY WITH RESECTOCOPE N/A 02/25/2013   Procedure: Attempted hysteroscopy with uterine perforation;  Surgeon: Jamey Reas de Berton Lan, MD;  Location: [facility];  Service: Gynecology;  Laterality: N/A;   ESOPHAGOGASTRODUODENOSCOPY  08/29/2010; 09/15/2010   Carcinoid tumor less than 1 cm in July 2012 not seen in August 2012 , gastritis, fundic gland polyps   ESOPHAGOGASTRODUODENOSCOPY  05/16/2011   ESOPHAGOGASTRODUODENOSCOPY  06/14/2012   EUS  12/15/2010   Procedure: UPPER ENDOSCOPIC ULTRASOUND (EUS) LINEAR;  Surgeon: Owens Loffler, MD;  Location: [facility];  Service: Endoscopy;  Laterality: N/A;   EYE SURGERY Bilateral    Bi lateral cateracts and bi lateral laser   LAPAROSCOPY N/A 02/25/2013   Procedure: Cystoscopy and laparoscopy with fulguration of uterine serosa;  Surgeon: Jamey Reas de Berton Lan, MD;  Location: [facility];  Service:  Gynecology;  Laterality: N/A;   TONSILLECTOMY     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   Renal insufficiency 06/18/2019   Educated about COVID-19 virus infection 05/15/2019   Atrophic vaginitis 01/20/2019   Stenosis of carotid artery 11/12/2018   Anxiety 10/13/2018   Thrush 12/07/2017   Sinusitis 10/11/2017   Headache 07/12/2017   Dizzy spells 11/21/2016   Constipation 11/21/2016    Nonrheumatic aortic valve stenosis 05/23/2016   Aortic atherosclerosis ([facility]) 05/23/2016   Hematuria 03/16/2016   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   Pain of upper abdomen 06/06/2015   Neck pain 04/22/2015   Ear pain 02/28/2015   Abnormal urine 11/21/2014   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   Iron malabsorption 06/10/2014   Anemia 06/08/2014   RLS (restless legs syndrome) 11/23/2013   Diabetic peripheral neuropathy ([place]) 10/29/2013   Left-sided thoracic back pain 10/06/2013   Encounter for preventative adult health care exam with abnormal findings 09/14/2013   Iron deficiency anemia    Status post laparoscopy 02/25/2013   Hyponatremia 01/09/2013   GERD (gastroesophageal reflux disease) 01/09/2013   Amaurosis fugax of left eye 10/16/2012   Low back pain 06/03/2012   Vitamin D deficiency 03/11/2012   Bilateral hand pain 10/20/2011   Encounter for long-term (current) use of other medications 10/20/2011   IBS (irritable bowel syndrome) 08/14/2011   TIA (transient ischemic attack) 02/10/2011   Abnormal brain CT 01/19/2011   Allergic rhinitis 10/01/2010   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   FUNDIC GLAND POLYPS OF STOMACH 03/18/2010   Abdominal pain in female 03/18/2010   Pain in joint 93/12/2160   SYSTOLIC MURMUR 44/69/5072   Paroxysmal supraventricular tachycardia 01/12/2009   PLANTAR FASCIITIS 06/08/2008   CHEST PAIN 05/18/2008   Gastroparesis 12/18/2007   HYPERCHOLESTEROLEMIA 06/11/2007   Diabetes mellitus type 2 in obese ([place]) 09/05/2006   Essential hypertension 09/05/2006    PCP: [name], MD  REFERRING PROVIDER: Marrian Salvage, FNP (low back pain) & [name], MD (neck pain)  REFERRING DIAG:  M54.41,G89.29 (ICD-10-CM) - Chronic right-sided low back pain with right-sided sciatica M54.2 (ICD-10-CM) - Neck pain  RATIONALE FOR EVALUATION AND TREATMENT: Rehabilitation  THERAPY DIAG:  Cervicalgia  Radiculopathy, cervical region  Abnormal  posture  Cramp and spasm  Other low back pain  Muscle spasm of back  Muscle weakness (generalized)  Difficulty in walking, not elsewhere classified  Other abnormalities of gait and mobility  ONSET DATE: acute on chronic exacerbation ~3 weeks ago; neck pain - chronic   NEXT MD VISIT: 03/28/22 with PCP   SUBJECTIVE:  SUBJECTIVE STATEMENT: Pt reports her back pain remains well controlled and states her L shoulder is more of an issue than her neck today.  PAIN:  Are you having pain? Yes: NPRS scale:  2/10 Pain location: L shoulder Pain description: discomfort  Are you having pain? No - Low back  PERTINENT HISTORY: Extensive PMH including chronic back and neck pain, OA, osteoporosis, DM, HTN, h/o TIA, diabetic peripheral neuropathy, PVD, headaches, sleep dysfunction, anxiety (refer to problem list and past medical/surgical history for full PMH)  PRECAUTIONS: None  WEIGHT BEARING RESTRICTIONS: No  FALLS:  Has patient fallen in last 6 months? No  LIVING ENVIRONMENT: Lives with: lives alone Lives in: House/apartment Stairs: No Has following equipment at home: Single point cane, Environmental consultant - 4 wheeled, shower chair, and Grab bars  OCCUPATION: Retired  PLOF: Independent and Leisure: read and play card games on her phone, has been doing her prior PT HEP recently qod since pain started up  PATIENT GOALS: "No pain."   OBJECTIVE:   DIAGNOSTIC FINDINGS:  05/25/20 - Lumbar x-ray:  FINDINGS: Decreased osseous mineralization. Stable lumbar vertebral body heights and alignment. Mild disc space narrowing is similar. Small endplate osteophytes are present at L2-L3 and L3-L4. Mild facet hypertrophy. Aortic calcification. IMPRESSION: Mild lumbar spondylosis similar to prior study (2018).  08/30/20 -  Cervical MRI: IMPRESSION: Severe foraminal stenosis on the left at C3-C4. Moderate foraminal stenosis on the left at C4-C5. Mild-to-moderate bilateral foraminal stenosis at C5-C6 and mild foraminal stenosis on the right at C3-C4 and C4-C5. Mild to moderate canal stenosis at C3-C4 and C4-C5. Mild canal stenosis at C5-C6.  PATIENT SURVEYS:  NDI 25 / 18 = 50.0%  (11/03/21) FOTO Lumbar = 28; predicted = 46  SCREENING FOR RED FLAGS: Bowel or bladder incontinence: No Spinal tumors: No Cauda equina syndrome: No Compression fracture: No Abdominal aneurysm: No  COGNITION:  Overall cognitive status: Within functional limits for tasks assessed     SENSATION: Light touch: Impaired  numbness in B feet  MUSCLE LENGTH: Hamstrings: mod tight R>L ITB: mod tight R>L Piriformis: mod tight B Hip flexors: NT Quads: NT Heelcord: NT  POSTURE:  rounded shoulders, forward head, decreased lumbar lordosis, and increased thoracic kyphosis  PALPATION: Increased muscle tension and TTP over lumbar paraspinals and R glutes/piriformis and lateral hip  LUMBAR ROM:   Active  AROM  Eval - 09/21/21  10/17/21  11/22/21  Flexion Hands level with knees Hands to mid shins Hands to lower shins  Extension 90% limited 75% limited 25% limited  Right lateral flexion Hand to mid thigh Hand to lateral knee Hand to lateral knee  Left lateral flexion Hand to mid thigh Hand to lateral knee Hand to lateral knee  Right rotation 80% limited 50% limited 25% limited  Left rotation 80% limited 50% limited 25% limited    LOWER EXTREMITY ROM:     Mildly limited primarily due to muscle tightness  LOWER EXTREMITY MMT:    MMT Right eval Left eval Right 10/17/21 Left  10/17/21 Right 11/22/21 Left 11/22/21  Hip flexion 3+ 3+ 3+ 3+ 4 4  Hip extension 3- 3- 3 3- 4 4  Hip abduction 4- 3+ 4 4- 4+ 4+  Hip adduction 3+ 3+ 3+ 3+ 4+ 4+  Hip internal rotation 3+ 3+ 4+ 4+ 5 5  Hip external rotation 3+ 3+ 4- 4- 4 4  Knee flexion  4- 4- 4+ 4+ 5 5  Knee extension 4- 4- 4 4 5 5   Ankle dorsiflexion  3+ 3+ 4- 4- 4+ 4+  Ankle plantarflexion 3- 3-   4 10 SLS HR 4 10 SLS HR  Ankle inversion        Ankle eversion         (Blank rows = not tested)  LUMBAR SPECIAL TESTS:  Straight leg raise test: Positive on Right  CERVICAL ROM:   Active ROM AROM (deg) Eval 11/01/21   11/22/21  Flexion 29 - pressure 38  Extension 34 - pain 38  Right lateral flexion 20 - pain 26  Left lateral flexion 22 24  Right rotation 48 64  Left rotation 49 - pain 57   (Blank rows = not tested)  UPPER EXTREMITY ROM:  Active ROM Right Eval 11/01/21 Left Eval 11/01/21  Right  11/29/21  Left  11/29/21  Shoulder flexion 126 120 126 125  Shoulder extension      Shoulder abduction 109 104 129 134  Shoulder adduction      Shoulder internal rotation FIR to T12 FIR to T10 FIR to T12 FIR to T10  Shoulder external rotation FER to T1 FER to T1 FER to T3 FER to T3  Elbow flexion      Elbow extension      Wrist flexion      Wrist extension      Wrist ulnar deviation      Wrist radial deviation      Wrist pronation      Wrist supination       (Blank rows = not tested)  UPPER EXTREMITY MMT:  MMT Right Eval 11/01/21 Left Eval 11/01/21  Right  11/29/21  Left  11/29/21  Shoulder flexion 4 4- 4+ 4+  Shoulder extension 4- 3+ 4 4  Shoulder abduction 4 4- 4+ 4  Shoulder adduction      Shoulder internal rotation 4- 4- 4 4  Shoulder external rotation 4- 3+ 4 4  Middle trapezius      Lower trapezius      Elbow flexion      Elbow extension      Wrist flexion      Wrist extension      Wrist ulnar deviation      Wrist radial deviation      Wrist pronation      Wrist supination      Grip strength       (Blank rows = not tested)  CERVICAL SPECIAL TESTS:  Spurling's test: Negative and Distraction test: Positive   TODAY'S TREATMENT:  11/29/21 THERAPEUTIC EXERCISE: to improve flexibility, strength and mobility.  Verbal and tactile  cues throughout for technique. NuStep - L5 x 6 min (UE/LE) Standing RTB B lat pull-down 2 x 10 - cues to drop elbows down as shoulder blades come together Standing RTB scap retraction + shoulder row 10 x 3" Standing RTB scap retraction + shoulder extension to neutral 10 x 3" Standing RTB scap retraction/depression + shoulder ER 10 x 3-5"; back along doorframe to promote scap retraction and upright posture  THERAPEUTIC ACTIVITIES: Shoulder ROM & MMT  MANUAL THERAPY: To promote normalized muscle tension, improved flexibility, increased ROM, and reduced pain. STM/DTM to L lats, teres group and anterolateral deltoid L scapular mobs - emphasis on scap retraction and depression    11/22/21 THERAPEUTIC EXERCISE: to improve flexibility, strength and mobility.  Verbal and tactile cues throughout for technique. NuStep - L5 x 6 min (UE/LE) Standing RTB scap retraction + shoulder row 10 x 3" Standing RTB scap retraction + shoulder  extension to neutral 10 x 3" Standing RTB scap retraction/depression + shoulder ER 10 x 3-5"; back along doorframe to promote scap retraction and upright posture  THERAPEUTIC ACTIVITIES: Lumbar FOTO = 69  Lumbar & cervical ROM LE MMT   11/10/21 THERAPEUTIC EXERCISE: to improve flexibility, strength and mobility.  Verbal and tactile cues throughout for technique. NuStep - L5 x 6 min (UE/LE) Cervical retraction in POE leaning fwd on red Pball 10 x 3" (pt uncomfortable lying prone) Seated B cervical rotation SNAGs with pillowcase 10 x 3" each Seated cervical extension with rolled pillowcase for support 10 x 3" Standing RTB scap retraction + shoulder row 10 x 3" Standing RTB scap retraction + shoulder extension to neutral 10 x 3" Standing RTB scap retraction/depression + shoulder ER 10 x 3-5"; back along doorframe to promote scap retraction and upright posture Standing RTB scap retraction + lat pull down 10 x 3"  MANUAL THERAPY: To promote normalized muscle tension,  improved flexibility, improved joint mobility, increased ROM, and reduced pain. STM/DTM, manual TPR and pin & stretch to L>R UT and LS   PATIENT EDUCATION:  Education details: progress with PT Person educated: Patient Education method: Explanation Education comprehension: verbalized understanding   HOME EXERCISE PROGRAM: Access Code: [email or web address] URL: [email or web address] Date: 11/29/2021 Prepared by: Annie Paras  Exercises - Hooklying Single Knee to Chest Stretch  - 2-3 x daily - 7 x weekly - 3 reps - 30 sec hold - Supine Piriformis Stretch with Foot on Ground  - 2-3 x daily - 7 x weekly - 3 reps - 30 sec hold - Supine Lower Trunk Rotation  - 2-3 x daily - 7 x weekly - 5 reps - 10 sec hold - Supine Posterior Pelvic Tilt  - 2-3 x daily - 7 x weekly - 2 sets - 10 reps - 5 sec hold - Supine Transversus Abdominis Bracing with Leg Extension  - 1 x daily - 7 x weekly - 2 sets - 10 reps - 3 sec hold - Seated Hamstring Stretch  - 2-3 x daily - 7 x weekly - 3 reps - 30 sec hold - Seated Flexion Stretch with Swiss Ball  - 2-3 x daily - 7 x weekly - 3 reps - 30 sec hold - Seated Thoracic Flexion and Rotation with Swiss Ball  - 2-3 x daily - 7 x weekly - 2-3 reps - 30 sec hold - Modified Thomas Stretch (Mirrored)  - 2-3 x daily - 3 x weekly - 3 reps - 30 sec hold - Hooklying Single Leg Bent Knee Fallouts with Resistance  - 1 x daily - 3 x weekly - 2 sets - 10 reps - 3 sec hold - Supine March with Resistance Band  - 1 x daily - 3 x weekly - 2 sets - 10 reps - 2-3 sec hold hold - Bridge with Resistance  - 1 x daily - 3 x weekly - 2 sets - 10 reps - 5 sec hold - Standing Lumbar Extension with Counter  - 1 x daily - 7 x weekly - 2 sets - 10 reps - 3 sec hold - Beginner Clam  - 1 x daily - 3 x weekly - 2 sets - 10 reps - 3 sec hold - Standing Hip Abduction with Resistance at Ankles and Counter Support  - 1 x daily - 3 x weekly - 2 sets - 10 reps - 3 sec hold - Standing Hip Extension with  Resistance at Ankles and Counter Support  -  1 x daily - 3 x weekly - 2 sets - 10 reps - 3 sec hold - Marching with Resistance  - 1 x daily - 3 x weekly - 2 sets - 10 reps - 3 sec hold - Squat with Chair and Counter Support  - 1 x daily - 3 x weekly - 2 sets - 10 reps - 3 sec hold - Heel Raises with Counter Support  - 1 x daily - 3 x weekly - 2 sets - 10 reps - 3 sec hold - Seated Cervical Retraction  - 2 x daily - 7 x weekly - 2 sets - 10 reps - 3-5 sec hold - Seated Gentle Upper Trapezius Stretch  - 2 x daily - 7 x weekly - 3 reps - 30 sec hold - Gentle Levator Scapulae Stretch  - 2 x daily - 7 x weekly - 3 reps - 30 sec hold - Seated Scapular Retraction  - 3 x daily - 7 x weekly - 2 sets - 10 reps - 5 sec hold - Cervical Extension AROM with Strap  - 2 x daily - 7 x weekly - 2 sets - 10 reps - 3-5 sec hold - Seated Thoracic Lumbar Extension  - 2 x daily - 7 x weekly - 2 sets - 10 reps - 3-5 sec hold - Standing Lat Pull Down with Resistance - Elbows Bent  - 1 x daily - 3 x weekly - 2 sets - 10 reps - 3 sec hold  Patient Education - Posture and Body Mechanics   ASSESSMENT:  CLINICAL IMPRESSION: Eliza reports her neck pain seems to be subsiding but still notes more pain towards her L upper and lateral shoulder. L shoulder pain seems to be related to tightness in L anterolateral deltoids, teres group and lats which was addressed with manual STM and TPR with good muscular response. Her posture and overhead shoulder ROM have improved but she still is limited by some fixed kyphotic posture as well as continue weakness in the posterior thoracic postural muscles. Cues still necessary for good scapular engagement during postural strengthening but she demonstrates good tolerance for progression of resistance bands.    OBJECTIVE IMPAIRMENTS: decreased activity tolerance, decreased endurance, decreased knowledge of condition, decreased mobility, difficulty walking, decreased ROM, decreased strength,  dizziness, hypomobility, increased fascial restrictions, impaired perceived functional ability, increased muscle spasms, impaired flexibility, impaired sensation, impaired UE functional use, improper body mechanics, postural dysfunction, and pain.   ACTIVITY LIMITATIONS: carrying, lifting, bending, sitting, standing, squatting, sleeping, stairs, transfers, bed mobility, bathing, toileting, dressing, reach over head, and locomotion level  PARTICIPATION LIMITATIONS: meal prep, cleaning, laundry, driving, shopping, community activity, and yard work  PERSONAL FACTORS: Age, Fitness, Past/current experiences, Time since onset of injury/illness/exacerbation, and 3+ comorbidities: Extensive PMH including chronic back and neck pain, OA, osteoporosis, DM, HTN, h/o TIA, diabetic peripheral neuropathy, PVD, headaches, sleep dysfunction, anxiety (refer to problem list and past medical/surgical history for full PMH)  are also affecting patient's functional outcome.   REHAB POTENTIAL: Good  CLINICAL DECISION MAKING: Evolving/moderate complexity  EVALUATION COMPLEXITY: Moderate   GOALS: Goals reviewed with patient? Yes  SHORT TERM GOALS: Target date: 10/19/2021   Patient will be independent with initial HEP to improve outcomes and carryover.  Baseline:  Goal status: MET  10/17/21  2.  Patient will report centralization of radicular symptoms.  Baseline:  Goal status: IN PROGRESS  10/17/21 - Pt reports radicular pain now only intermittent and less intense when present - mostly  in R thigh but still occasionally to calf  LONG TERM GOALS: Target date: 11/16/2021, revised to 12/13/21 as of re-eval for neck    Patient will be independent with ongoing/advanced HEP for self-management at home.  Baseline:  Goal status: IN PROGRESS 11/10/21 - Met for current HEP  2.  Patient will report 50-75% improvement in low back pain to improve QOL.  Baseline:  Goal status: MET  11/10/21 - Pt reports back pain currently  resolved  3.  Patient to demonstrate ability to achieve and maintain good spinal alignment/posturing and body mechanics needed for daily activities. Baseline:  Goal status: MET  11/22/21  4.  Patient will demonstrate functional pain free lumbar ROM to perform ADLs.   Baseline:  Goal status: MET  11/22/21 - Lumbar ROM WFL w/o pain reported  5.  Patient will demonstrate improved B LE strength to >/= 4/5 for improved stability and ease of mobility . Baseline:  Goal status: MET  11/22/21 - Refer to above MMT table  6.  Patient will report 22 on lumbar FOTO to demonstrate improved functional ability.  Baseline: 28 Goal status: MET  11/22/21 - Lumbar FOTO = 69   7.  Patient will tolerate 20-30 min of sitting w/o increased pain to improve comfort with eating meals or driving. Baseline:  Goal status: MET  11/22/21 - able to sit for up to 1 hr w/o increased pain  8.  Patient will report 50-75% improvement in neck pain to improve QOL.  Baseline:  Goal status: MET  11/22/21 - Pt reports 80% improvement in pain  9  Patient will demonstrate functional pain free cervical ROM for safety with driving.  Baseline:  Goal status: IN PROGRESS  11/22/21 - Cervical ROM improving in all planes  10.  Patient will demonstrate improved posture to decrease muscle imbalance. Baseline:  Goal status: MET  11/10/21 - Pt demonstrating more upright posture in sitting and standing with decreasing fwd head position  11. Patient will report at least 15% improvement on NDI to demonstrate improved functional ability.    Baseline: 25 / 50 = 50.0% Goal status: IN PROGRESS   12. Patient to report ability to perform ADLs, household, and leisure activities without limitation due to neck pain, LOM or weakness. Baseline:  Goal status: IN PROGRESS   PLAN: PT FREQUENCY: 2x/week  PT DURATION: 6 weeks  PLANNED INTERVENTIONS: Therapeutic exercises, Therapeutic activity, Neuromuscular re-education, Balance training, Gait  training, Patient/Family education, Self Care, Joint mobilization, Stair training, Aquatic Therapy, Dry Needling, Electrical stimulation, Spinal mobilization, Cryotherapy, Moist heat, Taping, Traction, Ultrasound, Ionotophoresis 58m/ml Dexamethasone, Manual therapy, and Re-evaluation.  PLAN FOR NEXT SESSION:  gently progress postural and cervical/lumbopelvic flexibility and strengthening - update HEP as indicated; MT +/- DN to address pain and abnormal muscle tension; modalities PRN for pain; review of posture and body mechanics education PRN   JPercival Spanish PT 11/29/2021, 6:57 PM

## 2021-12-02 ENCOUNTER — Ambulatory Visit: Payer: Medicare Other | Admitting: Physical Therapy

## 2021-12-05 ENCOUNTER — Ambulatory Visit: Payer: Medicare Other | Admitting: Physical Therapy

## 2021-12-05 ENCOUNTER — Encounter: Payer: Self-pay | Admitting: Physical Therapy

## 2021-12-05 DIAGNOSIS — R252 Cramp and spasm: Secondary | ICD-10-CM | POA: Diagnosis not present

## 2021-12-05 DIAGNOSIS — M5412 Radiculopathy, cervical region: Secondary | ICD-10-CM

## 2021-12-05 DIAGNOSIS — M542 Cervicalgia: Secondary | ICD-10-CM

## 2021-12-05 DIAGNOSIS — M6281 Muscle weakness (generalized): Secondary | ICD-10-CM

## 2021-12-05 DIAGNOSIS — R2689 Other abnormalities of gait and mobility: Secondary | ICD-10-CM

## 2021-12-05 DIAGNOSIS — M62830 Muscle spasm of back: Secondary | ICD-10-CM

## 2021-12-05 DIAGNOSIS — R262 Difficulty in walking, not elsewhere classified: Secondary | ICD-10-CM

## 2021-12-05 DIAGNOSIS — M5459 Other low back pain: Secondary | ICD-10-CM | POA: Diagnosis not present

## 2021-12-05 DIAGNOSIS — R293 Abnormal posture: Secondary | ICD-10-CM | POA: Diagnosis not present

## 2021-12-05 NOTE — Therapy (Signed)
OUTPATIENT PHYSICAL THERAPY TREATMENT   Patient Name: [name] MRN: [id number] DOB:[birth year], 81 y.o., female Today's Date: 12/05/2021     PT End of Session - 12/05/21 1325     Visit Number 16    Date for PT Re-Evaluation 12/13/21    Authorization Type Medicare    Progress Note Due on Visit 20    PT Start Time 1320    PT Stop Time 1400    PT Time Calculation (min) 40 min    Activity Tolerance Patient limited by pain;Patient tolerated treatment well    Behavior During Therapy [facility] for tasks assessed/performed                       Past Medical History:  Diagnosis Date   Abdominal pain in female 03/18/2010   Qualifier: Diagnosis of  By: Carlean Purl MD, Tonna Boehringer E    Anemia 06/08/2014   Anxiety    Arthritis    Spinal Osteoarthritis   Cancer ([place])    Carcinoid tumor of stomach    Cataract    Chest pain    Myoview 12/15 no ischemia.   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 ([place]) 09/05/2006   Qualifier: Diagnosis of  By: Marca Ancona RMA, Lucy     Diabetic peripheral neuropathy ([place]) 10/29/2013   Encounter for preventative adult health care exam with abnormal findings 09/14/2013   Esophageal reflux    Gastric polyp    Fundic Gland   Gastroparesis    Headache(784.0)    Heart murmur    Echocardiogram 2/11: EF 60-65%, mild LAE, grade 1 diastolic dysfunction, aortic valve sclerosis, mean gradient 9 mm of mercury, PASP 34   Hematuria 03/16/2016   Iron deficiency anemia, unspecified    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   Leg swelling    bilateral   Neck pain 04/22/2015   PONV (postoperative nausea and vomiting)    pt states only needs small amount of anesthesia   PSVT (paroxysmal supraventricular tachycardia)    Pure hypercholesterolemia    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   Renal insufficiency 06/18/2019   Stroke ([place])    tia, 2014   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   Type II or unspecified type  diabetes mellitus without mention of complication, not stated as uncontrolled    Unspecified essential hypertension    Unspecified hereditary and idiopathic peripheral neuropathy 10/29/2013   Past Surgical History:  Procedure Laterality Date   CHOLECYSTECTOMY  1993   COLONOSCOPY  11/11/2010   diverticulosis   DILATATION & CURRETTAGE/HYSTEROSCOPY WITH RESECTOCOPE N/A 02/25/2013   Procedure: Attempted hysteroscopy with uterine perforation;  Surgeon: Jamey Reas de Berton Lan, MD;  Location: [facility];  Service: Gynecology;  Laterality: N/A;   ESOPHAGOGASTRODUODENOSCOPY  08/29/2010; 09/15/2010   Carcinoid tumor less than 1 cm in July 2012 not seen in August 2012 , gastritis, fundic gland polyps   ESOPHAGOGASTRODUODENOSCOPY  05/16/2011   ESOPHAGOGASTRODUODENOSCOPY  06/14/2012   EUS  12/15/2010   Procedure: UPPER ENDOSCOPIC ULTRASOUND (EUS) LINEAR;  Surgeon: Owens Loffler, MD;  Location: [facility];  Service: Endoscopy;  Laterality: N/A;   EYE SURGERY Bilateral    Bi lateral cateracts and bi lateral laser   LAPAROSCOPY N/A 02/25/2013   Procedure: Cystoscopy and laparoscopy with fulguration of uterine serosa;  Surgeon: Jamey Reas de Berton Lan, MD;  Location: [facility];  Service: Gynecology;  Laterality: N/A;  TONSILLECTOMY     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   Renal insufficiency 06/18/2019   Educated about COVID-19 virus infection 05/15/2019   Atrophic vaginitis 01/20/2019   Stenosis of carotid artery 11/12/2018   Anxiety 10/13/2018   Thrush 12/07/2017   Sinusitis 10/11/2017   Headache 07/12/2017   Dizzy spells 11/21/2016   Constipation 11/21/2016   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   Aortic atherosclerosis ([facility]) 05/23/2016   Hematuria 03/16/2016   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   Pain of upper abdomen 06/06/2015   Neck pain 04/22/2015   Ear pain 02/28/2015   Abnormal urine 11/21/2014   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   Anemia 06/08/2014   RLS (restless legs syndrome) 11/23/2013   Diabetic peripheral neuropathy ([place]) 10/29/2013   Left-sided thoracic back pain 10/06/2013   Encounter for preventative adult health care exam with abnormal findings 09/14/2013   Iron deficiency anemia    Status post laparoscopy 02/25/2013   Hyponatremia 01/09/2013   GERD (gastroesophageal reflux disease) 01/09/2013   Amaurosis fugax of left eye 10/16/2012   Low back pain 06/03/2012   Vitamin D deficiency 03/11/2012   Bilateral hand pain 10/20/2011   Encounter for long-term (current) use of other medications 10/20/2011   IBS (irritable bowel syndrome) 08/14/2011   TIA (transient ischemic attack) 02/10/2011   Abnormal brain CT 01/19/2011   Allergic rhinitis 10/01/2010   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   FUNDIC GLAND POLYPS OF STOMACH 03/18/2010   Abdominal pain in female 03/18/2010   Pain in joint 20/25/4270   SYSTOLIC MURMUR 62/37/6283   Paroxysmal supraventricular tachycardia 01/12/2009   PLANTAR FASCIITIS 06/08/2008   CHEST PAIN 05/18/2008   Gastroparesis 12/18/2007   HYPERCHOLESTEROLEMIA 06/11/2007   Diabetes mellitus type 2 in obese ([place]) 09/05/2006   Essential hypertension 09/05/2006    PCP: [name], MD  REFERRING PROVIDER: Marrian Salvage, FNP (low back pain) & [name], MD (neck pain)  REFERRING DIAG:  M54.41,G89.29 (ICD-10-CM) - Chronic right-sided low back pain with right-sided sciatica M54.2 (ICD-10-CM) - Neck pain  RATIONALE FOR EVALUATION AND TREATMENT: Rehabilitation  THERAPY DIAG:  Cervicalgia  Radiculopathy, cervical region  Abnormal posture  Cramp and spasm  Other  low back pain  Muscle spasm of back  Muscle weakness (generalized)  Difficulty in walking, not elsewhere classified  Other abnormalities of gait and mobility  ONSET DATE: acute on chronic exacerbation ~3 weeks ago; neck pain - chronic   NEXT MD VISIT: 03/28/22 with PCP   SUBJECTIVE:  SUBJECTIVE STATEMENT: Pt reports pain increased after last therapy session, reports not being able to complete HEP since last session.   PAIN:  Are you having pain? Yes: NPRS scale: 8/10 Pain location: L shoulder Pain description: discomfort  Are you having pain? No - Low back  PERTINENT HISTORY: Extensive PMH including chronic back and neck pain, OA, osteoporosis, DM, HTN, h/o TIA, diabetic peripheral neuropathy, PVD, headaches, sleep dysfunction, anxiety (refer to problem list and past medical/surgical history for full PMH)  PRECAUTIONS: None  WEIGHT BEARING RESTRICTIONS: No  FALLS:  Has patient fallen in last 6 months? No  LIVING ENVIRONMENT: Lives with: lives alone Lives in: House/apartment Stairs: No Has following equipment at home: Single point cane, Environmental consultant - 4 wheeled, shower chair, and Grab bars  OCCUPATION: Retired  PLOF: Independent and Leisure: read and play card games on her phone, has been doing her prior PT HEP recently qod since pain started up  PATIENT GOALS: "No pain."   OBJECTIVE:   DIAGNOSTIC FINDINGS:  05/25/20 - Lumbar x-ray:  FINDINGS: Decreased osseous mineralization. Stable lumbar vertebral body heights and alignment. Mild disc space narrowing is similar. Small endplate osteophytes are present at L2-L3 and L3-L4. Mild facet hypertrophy. Aortic calcification. IMPRESSION: Mild lumbar spondylosis similar to prior study (2018).  08/30/20 - Cervical MRI: IMPRESSION: Severe  foraminal stenosis on the left at C3-C4. Moderate foraminal stenosis on the left at C4-C5. Mild-to-moderate bilateral foraminal stenosis at C5-C6 and mild foraminal stenosis on the right at C3-C4 and C4-C5. Mild to moderate canal stenosis at C3-C4 and C4-C5. Mild canal stenosis at C5-C6.  PATIENT SURVEYS:  NDI 25 / 40 = 50.0%  (11/03/21) FOTO Lumbar = 28; predicted = 46  SCREENING FOR RED FLAGS: Bowel or bladder incontinence: No Spinal tumors: No Cauda equina syndrome: No Compression fracture: No Abdominal aneurysm: No  COGNITION:  Overall cognitive status: Within functional limits for tasks assessed     SENSATION: Light touch: Impaired  numbness in B feet  MUSCLE LENGTH: Hamstrings: mod tight R>L ITB: mod tight R>L Piriformis: mod tight B Hip flexors: NT Quads: NT Heelcord: NT  POSTURE:  rounded shoulders, forward head, decreased lumbar lordosis, and increased thoracic kyphosis  PALPATION: Increased muscle tension and TTP over lumbar paraspinals and R glutes/piriformis and lateral hip  LUMBAR ROM:   Active  AROM  Eval - 09/21/21  10/17/21  11/22/21  Flexion Hands level with knees Hands to mid shins Hands to lower shins  Extension 90% limited 75% limited 25% limited  Right lateral flexion Hand to mid thigh Hand to lateral knee Hand to lateral knee  Left lateral flexion Hand to mid thigh Hand to lateral knee Hand to lateral knee  Right rotation 80% limited 50% limited 25% limited  Left rotation 80% limited 50% limited 25% limited    LOWER EXTREMITY ROM:     Mildly limited primarily due to muscle tightness  LOWER EXTREMITY MMT:    MMT Right eval Left eval Right 10/17/21 Left  10/17/21 Right 11/22/21 Left 11/22/21  Hip flexion 3+ 3+ 3+ 3+ 4 4  Hip extension 3- 3- 3 3- 4 4  Hip abduction 4- 3+ 4 4- 4+ 4+  Hip adduction 3+ 3+ 3+ 3+ 4+ 4+  Hip internal rotation 3+ 3+ 4+ 4+ 5 5  Hip external rotation 3+ 3+ 4- 4- 4 4  Knee flexion 4- 4- 4+ 4+ 5 5  Knee extension  4- 4- _0 Ankle dorsiflexion 3+ 3+ 4- 4-  4+ 4+  Ankle plantarflexion 3- 3-   4 10 SLS HR 4 10 SLS HR  Ankle inversion        Ankle eversion         (Blank rows = not tested)  LUMBAR SPECIAL TESTS:  Straight leg raise test: Positive on Right  CERVICAL ROM:   Active ROM AROM (deg) Eval 11/01/21   11/22/21  Flexion 29 - pressure 38  Extension 34 - pain 38  Right lateral flexion 20 - pain 26  Left lateral flexion 22 24  Right rotation 48 64  Left rotation 49 - pain 57   (Blank rows = not tested)  UPPER EXTREMITY ROM:  Active ROM Right Eval 11/01/21 Left Eval 11/01/21  Right  11/29/21  Left  11/29/21  Shoulder flexion 126 120 126 125  Shoulder extension      Shoulder abduction 109 104 129 134  Shoulder adduction      Shoulder internal rotation FIR to T12 FIR to T10 FIR to T12 FIR to T10  Shoulder external rotation FER to T1 FER to T1 FER to T3 FER to T3  Elbow flexion      Elbow extension      Wrist flexion      Wrist extension      Wrist ulnar deviation      Wrist radial deviation      Wrist pronation      Wrist supination       (Blank rows = not tested)  UPPER EXTREMITY MMT:  MMT Right Eval 11/01/21 Left Eval 11/01/21  Right  11/29/21  Left  11/29/21  Shoulder flexion 4 4- 4+ 4+  Shoulder extension 4- 3+ 4 4  Shoulder abduction 4 4- 4+ 4  Shoulder adduction      Shoulder internal rotation 4- 4- 4 4  Shoulder external rotation 4- 3+ 4 4  Middle trapezius      Lower trapezius      Elbow flexion      Elbow extension      Wrist flexion      Wrist extension      Wrist ulnar deviation      Wrist radial deviation      Wrist pronation      Wrist supination      Grip strength       (Blank rows = not tested)  CERVICAL SPECIAL TESTS:  Spurling's test: Negative and Distraction test: Positive   TODAY'S TREATMENT: 12/05/21 THERAPEUTIC EXERCISE: to improve flexibility, strength and mobility.  Verbal and tactile cues throughout for  technique. UE- L2 x 6 min, 3 min forward, 3 min backward Seated Scapular retractions 5x- manual assistance to facilitate proper positioning and scapular motion Seated thoracic extension over chair 10x- cues to place hands behind head to increase pec stretch Seated thoracic extension with pool noodle along spine for pec stretch 2x15" Seated ER and scapular retraction against pool noodle 2x15- cues for  Sidelying Open book R & L 10x each   11/29/21 THERAPEUTIC EXERCISE: to improve flexibility, strength and mobility.  Verbal and tactile cues throughout for technique. NuStep - L5 x 6 min (UE/LE) Standing RTB B lat pull-down 2 x 10 - cues to drop elbows down as shoulder blades come together Standing RTB scap retraction + shoulder row 10 x 3" Standing RTB scap retraction + shoulder extension to neutral 10 x 3" Standing RTB scap retraction/depression + shoulder ER 10 x 3-5"; back along doorframe to promote scap retraction and  upright posture  THERAPEUTIC ACTIVITIES: Shoulder ROM & MMT  MANUAL THERAPY: To promote normalized muscle tension, improved flexibility, increased ROM, and reduced pain. STM/DTM to L lats, teres group and anterolateral deltoid L scapular mobs - emphasis on scap retraction and depression    11/22/21 THERAPEUTIC EXERCISE: to improve flexibility, strength and mobility.  Verbal and tactile cues throughout for technique. NuStep - L5 x 6 min (UE/LE) Standing RTB scap retraction + shoulder row 10 x 3" Standing RTB scap retraction + shoulder extension to neutral 10 x 3" Standing RTB scap retraction/depression + shoulder ER 10 x 3-5"; back along doorframe to promote scap retraction and upright posture  THERAPEUTIC ACTIVITIES: Lumbar FOTO = 69  Lumbar & cervical ROM LE MMT   PATIENT EDUCATION:  Education details: progress with PT, ongoing PT POC, HEP modification - open book stretch, thoracic extension with chair, seated scapular retraction and shoulder ER, postural  awareness, and posture and body mechanics for typical daily postioning, mobility and household tasks Person educated: Patient Education method: Explanation Education comprehension: verbalized understanding and verbal cues required   HOME EXERCISE PROGRAM: Access Code: [email or web address] URL: [email or web address] Date: 12/05/2021 Prepared by: Zeb Comfort  Exercises - Hooklying Single Knee to Chest Stretch  - 2-3 x daily - 7 x weekly - 3 reps - 30 sec hold - Supine Piriformis Stretch with Foot on Ground  - 2-3 x daily - 7 x weekly - 3 reps - 30 sec hold - Supine Lower Trunk Rotation  - 2-3 x daily - 7 x weekly - 5 reps - 10 sec hold - Supine Posterior Pelvic Tilt  - 2-3 x daily - 7 x weekly - 2 sets - 10 reps - 5 sec hold - Supine Transversus Abdominis Bracing with Leg Extension  - 1 x daily - 7 x weekly - 2 sets - 10 reps - 3 sec hold - Seated Hamstring Stretch  - 2-3 x daily - 7 x weekly - 3 reps - 30 sec hold - Seated Flexion Stretch with Swiss Ball  - 2-3 x daily - 7 x weekly - 3 reps - 30 sec hold - Seated Thoracic Flexion and Rotation with Swiss Ball  - 2-3 x daily - 7 x weekly - 2-3 reps - 30 sec hold - Modified Thomas Stretch (Mirrored)  - 2-3 x daily - 3 x weekly - 3 reps - 30 sec hold - Hooklying Single Leg Bent Knee Fallouts with Resistance  - 1 x daily - 3 x weekly - 2 sets - 10 reps - 3 sec hold - Supine March with Resistance Band  - 1 x daily - 3 x weekly - 2 sets - 10 reps - 2-3 sec hold hold - Bridge with Resistance  - 1 x daily - 3 x weekly - 2 sets - 10 reps - 5 sec hold - Standing Lumbar Extension with Counter  - 1 x daily - 7 x weekly - 2 sets - 10 reps - 3 sec hold - Beginner Clam  - 1 x daily - 3 x weekly - 2 sets - 10 reps - 3 sec hold - Standing Hip Abduction with Resistance at Ankles and Counter Support  - 1 x daily - 3 x weekly - 2 sets - 10 reps - 3 sec hold - Standing Hip Extension with Resistance at Ankles and Counter Support  - 1 x daily - 3 x weekly - 2  sets - 10 reps - 3 sec hold - Marching with Resistance  -  1 x daily - 3 x weekly - 2 sets - 10 reps - 3 sec hold - Squat with Chair and Counter Support  - 1 x daily - 3 x weekly - 2 sets - 10 reps - 3 sec hold - Heel Raises with Counter Support  - 1 x daily - 3 x weekly - 2 sets - 10 reps - 3 sec hold - Seated Cervical Retraction  - 2 x daily - 7 x weekly - 2 sets - 10 reps - 3-5 sec hold - Seated Gentle Upper Trapezius Stretch  - 2 x daily - 7 x weekly - 3 reps - 30 sec hold - Gentle Levator Scapulae Stretch  - 2 x daily - 7 x weekly - 3 reps - 30 sec hold - Seated Scapular Retraction  - 3 x daily - 7 x weekly - 2 sets - 10 reps - 5 sec hold - Cervical Extension AROM with Strap  - 2 x daily - 7 x weekly - 2 sets - 10 reps - 3-5 sec hold - Standing Lat Pull Down with Resistance - Elbows Bent  - 1 x daily - 3 x weekly - 2 sets - 10 reps - 3 sec hold - Seated Thoracic Lumbar Extension with Pectoralis Stretch  - 1 x daily - 7 x weekly - 2 sets - 10 reps - 3 sec hold - Seated Scapular Retraction with External Rotation  - 1 x daily - 7 x weekly - 2 sets - 10 reps - 3 sec hold - Seated Shoulder Extension  - 1 x daily - 7 x weekly - 2 sets - 10 reps - 3 sec hold - Sidelying Open Book  - 1 x daily - 7 x weekly - 2 sets - 10 reps - 3 sec hold  Patient Education - Posture and Body Mechanics   ASSESSMENT:  CLINICAL IMPRESSION: Zyonna reports increased pain in her neck after therapy last visit and performing HEP that still radiates to lateral shoulder. She still has limited thoracic extension due to increase kyphotic posture and weakness of the posterior postural muscles. Tenderness and pain in anterior shoulder during stretching still present due to tightness in anterior muscles. Cues needed for scapular engagement and proper positioning throughout exercises and daily activity. Pt reports positive response to therapy with decreased shoulder pain. Skilled therapy to continue scapulothoracic exercises for  postural control.    OBJECTIVE IMPAIRMENTS: decreased activity tolerance, decreased endurance, decreased knowledge of condition, decreased mobility, difficulty walking, decreased ROM, decreased strength, dizziness, hypomobility, increased fascial restrictions, impaired perceived functional ability, increased muscle spasms, impaired flexibility, impaired sensation, impaired UE functional use, improper body mechanics, postural dysfunction, and pain.   ACTIVITY LIMITATIONS: carrying, lifting, bending, sitting, standing, squatting, sleeping, stairs, transfers, bed mobility, bathing, toileting, dressing, reach over head, and locomotion level  PARTICIPATION LIMITATIONS: meal prep, cleaning, laundry, driving, shopping, community activity, and yard work  PERSONAL FACTORS: Age, Fitness, Past/current experiences, Time since onset of injury/illness/exacerbation, and 3+ comorbidities: Extensive PMH including chronic back and neck pain, OA, osteoporosis, DM, HTN, h/o TIA, diabetic peripheral neuropathy, PVD, headaches, sleep dysfunction, anxiety (refer to problem list and past medical/surgical history for full PMH)  are also affecting patient's functional outcome.   REHAB POTENTIAL: Good  CLINICAL DECISION MAKING: Evolving/moderate complexity  EVALUATION COMPLEXITY: Moderate   GOALS: Goals reviewed with patient? Yes  SHORT TERM GOALS: Target date: 10/19/2021   Patient will be independent with initial HEP to improve outcomes and carryover.  Baseline:  Goal status: MET  10/17/21  2.  Patient will report centralization of radicular symptoms.  Baseline:  Goal status: MET  12/05/21 - as related to LBP   LONG TERM GOALS: Target date: 11/16/2021, revised to 12/13/21 as of re-eval for neck    Patient will be independent with ongoing/advanced HEP for self-management at home.  Baseline:  Goal status: IN PROGRESS 11/10/21 - Met for current HEP  2.  Patient will report 50-75% improvement in low back pain to  improve QOL.  Baseline:  Goal status: MET  11/10/21 - Pt reports back pain currently resolved  3.  Patient to demonstrate ability to achieve and maintain good spinal alignment/posturing and body mechanics needed for daily activities. Baseline:  Goal status: MET  11/22/21  4.  Patient will demonstrate functional pain free lumbar ROM to perform ADLs.   Baseline:  Goal status: MET  11/22/21 - Lumbar ROM WFL w/o pain reported  5.  Patient will demonstrate improved B LE strength to >/= 4/5 for improved stability and ease of mobility . Baseline:  Goal status: MET  11/22/21 - Refer to above MMT table  6.  Patient will report 35 on lumbar FOTO to demonstrate improved functional ability.  Baseline: 28 Goal status: MET  11/22/21 - Lumbar FOTO = 69   7.  Patient will tolerate 20-30 min of sitting w/o increased pain to improve comfort with eating meals or driving. Baseline:  Goal status: MET  11/22/21 - able to sit for up to 1 hr w/o increased pain  8.  Patient will report 50-75% improvement in neck pain to improve QOL.  Baseline:  Goal status: MET  11/22/21 - Pt reports 80% improvement in pain  9  Patient will demonstrate functional pain free cervical ROM for safety with driving.  Baseline:  Goal status: IN PROGRESS  11/22/21 - Cervical ROM improving in all planes  10.  Patient will demonstrate improved posture to decrease muscle imbalance. Baseline:  Goal status: MET  11/10/21 - Pt demonstrating more upright posture in sitting and standing with decreasing fwd head position  11. Patient will report at least 15% improvement on NDI to demonstrate improved functional ability.    Baseline: 25 / 50 = 50.0% Goal status: IN PROGRESS   12. Patient to report ability to perform ADLs, household, and leisure activities without limitation due to neck pain, LOM or weakness. Baseline:  Goal status: IN PROGRESS   PLAN: PT FREQUENCY: 2x/week  PT DURATION: 6 weeks  PLANNED INTERVENTIONS:  Therapeutic exercises, Therapeutic activity, Neuromuscular re-education, Balance training, Gait training, Patient/Family education, Self Care, Joint mobilization, Stair training, Aquatic Therapy, Dry Needling, Electrical stimulation, Spinal mobilization, Cryotherapy, Moist heat, Taping, Traction, Ultrasound, Ionotophoresis 63m/ml Dexamethasone, Manual therapy, and Re-evaluation.  PLAN FOR NEXT SESSION:  review and revise HEP for discharge planning, gently progress postural and cervical/lumbopelvic flexibility and strengthening; MT +/- DN to address pain and abnormal muscle tension; modalities PRN for pain; review of posture and body mechanics education PRN   OZeb Comfort Student-PT 12/05/2021, 4:31 PM

## 2021-12-08 ENCOUNTER — Encounter: Payer: Self-pay | Admitting: Physical Therapy

## 2021-12-08 ENCOUNTER — Ambulatory Visit: Payer: Medicare Other | Attending: Family Medicine | Admitting: Physical Therapy

## 2021-12-08 DIAGNOSIS — R252 Cramp and spasm: Secondary | ICD-10-CM | POA: Diagnosis not present

## 2021-12-08 DIAGNOSIS — M542 Cervicalgia: Secondary | ICD-10-CM | POA: Diagnosis not present

## 2021-12-08 DIAGNOSIS — M5459 Other low back pain: Secondary | ICD-10-CM | POA: Insufficient documentation

## 2021-12-08 DIAGNOSIS — R262 Difficulty in walking, not elsewhere classified: Secondary | ICD-10-CM | POA: Diagnosis not present

## 2021-12-08 DIAGNOSIS — M5412 Radiculopathy, cervical region: Secondary | ICD-10-CM | POA: Insufficient documentation

## 2021-12-08 DIAGNOSIS — M62830 Muscle spasm of back: Secondary | ICD-10-CM | POA: Diagnosis not present

## 2021-12-08 DIAGNOSIS — M6281 Muscle weakness (generalized): Secondary | ICD-10-CM | POA: Insufficient documentation

## 2021-12-08 DIAGNOSIS — R293 Abnormal posture: Secondary | ICD-10-CM | POA: Diagnosis not present

## 2021-12-08 DIAGNOSIS — R2689 Other abnormalities of gait and mobility: Secondary | ICD-10-CM | POA: Insufficient documentation

## 2021-12-08 NOTE — Therapy (Signed)
OUTPATIENT PHYSICAL THERAPY TREATMENT   Patient Name: [name] MRN: [id number] DOB:02-Mar-1940, 81 y.o., female Today's Date: 12/08/2021     PT End of Session - 12/08/21 1447     Visit Number 17    Date for PT Re-Evaluation 12/13/21    Authorization Type Medicare    Progress Note Due on Visit 20    PT Start Time 1447    PT Stop Time 1527    PT Time Calculation (min) 40 min    Activity Tolerance Patient tolerated treatment well    Behavior During Therapy [facility] for tasks assessed/performed                       Past Medical History:  Diagnosis Date   Abdominal pain in female 03/18/2010   Qualifier: Diagnosis of  By: Carlean Purl MD, Tonna Boehringer E    Anemia 06/08/2014   Anxiety    Arthritis    Spinal Osteoarthritis   Cancer ([place])    Carcinoid tumor of stomach    Cataract    Chest pain    Myoview 12/15 no ischemia.   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 ([place]) 09/05/2006   Qualifier: Diagnosis of  By: Marca Ancona RMA, Lucy     Diabetic peripheral neuropathy ([place]) 10/29/2013   Encounter for preventative adult health care exam with abnormal findings 09/14/2013   Esophageal reflux    Gastric polyp    Fundic Gland   Gastroparesis    Headache(784.0)    Heart murmur    Echocardiogram 2/11: EF 60-65%, mild LAE, grade 1 diastolic dysfunction, aortic valve sclerosis, mean gradient 9 mm of mercury, PASP 34   Hematuria 03/16/2016   Iron deficiency anemia, unspecified    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   Leg swelling    bilateral   Neck pain 04/22/2015   PONV (postoperative nausea and vomiting)    pt states only needs small amount of anesthesia   PSVT (paroxysmal supraventricular tachycardia)    Pure hypercholesterolemia    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   Renal insufficiency 06/18/2019   Stroke ([place])    tia, 2014   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   Type II or unspecified type diabetes mellitus without  mention of complication, not stated as uncontrolled    Unspecified essential hypertension    Unspecified hereditary and idiopathic peripheral neuropathy 10/29/2013   Past Surgical History:  Procedure Laterality Date   CHOLECYSTECTOMY  1993   COLONOSCOPY  11/11/2010   diverticulosis   DILATATION & CURRETTAGE/HYSTEROSCOPY WITH RESECTOCOPE N/A 02/25/2013   Procedure: Attempted hysteroscopy with uterine perforation;  Surgeon: Jamey Reas de Berton Lan, MD;  Location: [facility];  Service: Gynecology;  Laterality: N/A;   ESOPHAGOGASTRODUODENOSCOPY  08/29/2010; 09/15/2010   Carcinoid tumor less than 1 cm in July 2012 not seen in August 2012 , gastritis, fundic gland polyps   ESOPHAGOGASTRODUODENOSCOPY  05/16/2011   ESOPHAGOGASTRODUODENOSCOPY  06/14/2012   EUS  12/15/2010   Procedure: UPPER ENDOSCOPIC ULTRASOUND (EUS) LINEAR;  Surgeon: Owens Loffler, MD;  Location: [facility];  Service: Endoscopy;  Laterality: N/A;   EYE SURGERY Bilateral    Bi lateral cateracts and bi lateral laser   LAPAROSCOPY N/A 02/25/2013   Procedure: Cystoscopy and laparoscopy with fulguration of uterine serosa;  Surgeon: Jamey Reas de Berton Lan, MD;  Location: [facility];  Service: Gynecology;  Laterality: N/A;   TONSILLECTOMY  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   Renal insufficiency 06/18/2019   Educated about COVID-19 virus infection 05/15/2019   Atrophic vaginitis 01/20/2019   Stenosis of carotid artery 11/12/2018   Anxiety 10/13/2018   Thrush 12/07/2017   Sinusitis 10/11/2017   Headache 07/12/2017   Dizzy spells 11/21/2016   Constipation 11/21/2016   Nonrheumatic aortic valve stenosis 05/23/2016    Aortic atherosclerosis ([facility]) 05/23/2016   Hematuria 03/16/2016   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   Pain of upper abdomen 06/06/2015   Neck pain 04/22/2015   Ear pain 02/28/2015   Abnormal urine 11/21/2014   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/10/2014   Anemia 06/08/2014   RLS (restless legs syndrome) 11/23/2013   Diabetic peripheral neuropathy ([place]) 10/29/2013   Left-sided thoracic back pain 10/06/2013   Encounter for preventative adult health care exam with abnormal findings 09/14/2013   Iron deficiency anemia    Status post laparoscopy 02/25/2013   Hyponatremia 01/09/2013   GERD (gastroesophageal reflux disease) 01/09/2013   Amaurosis fugax of left eye 10/16/2012   Low back pain 06/03/2012   Vitamin D deficiency 03/11/2012   Bilateral hand pain 10/20/2011   Encounter for long-term (current) use of other medications 10/20/2011   IBS (irritable bowel syndrome) 08/14/2011   TIA (transient ischemic attack) 02/10/2011   Abnormal brain CT 01/19/2011   Allergic rhinitis 10/01/2010   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   FUNDIC GLAND POLYPS OF STOMACH 03/18/2010   Abdominal pain in female 03/18/2010   Pain in joint 84/13/2440   SYSTOLIC MURMUR 12/03/2534   Paroxysmal supraventricular tachycardia 01/12/2009   PLANTAR FASCIITIS 06/08/2008   CHEST PAIN 05/18/2008   Gastroparesis 12/18/2007   HYPERCHOLESTEROLEMIA 06/11/2007   Diabetes mellitus type 2 in obese ([place]) 09/05/2006   Essential hypertension 09/05/2006    PCP: [name], MD  REFERRING PROVIDER: Marrian Salvage, FNP (low back pain) & [name], MD (neck pain)  REFERRING DIAG:  M54.41,G89.29 (ICD-10-CM) - Chronic right-sided low back pain with right-sided sciatica M54.2 (ICD-10-CM) - Neck pain  RATIONALE FOR EVALUATION AND TREATMENT: Rehabilitation  THERAPY DIAG:  Cervicalgia  Radiculopathy, cervical region  Abnormal posture  Cramp and spasm  Other low back pain  Muscle  spasm of back  Muscle weakness (generalized)  Difficulty in walking, not elsewhere classified  Other abnormalities of gait and mobility  ONSET DATE: acute on chronic exacerbation ~3 weeks ago; neck pain - chronic   NEXT MD VISIT: 03/28/22 with PCP   SUBJECTIVE:  SUBJECTIVE STATEMENT: Pt reports "only a little" pain today between her neck and shoulder.   PAIN:  Are you having pain? Yes: NPRS scale: 5/10 Pain location: L upper shoulder Pain description: discomfort  Are you having pain? No - Low back  PERTINENT HISTORY: Extensive PMH including chronic back and neck pain, OA, osteoporosis, DM, HTN, h/o TIA, diabetic peripheral neuropathy, PVD, headaches, sleep dysfunction, anxiety (refer to problem list and past medical/surgical history for full PMH)  PRECAUTIONS: None  WEIGHT BEARING RESTRICTIONS: No  FALLS:  Has patient fallen in last 6 months? No  LIVING ENVIRONMENT: Lives with: lives alone Lives in: House/apartment Stairs: No Has following equipment at home: Single point cane, Environmental consultant - 4 wheeled, shower chair, and Grab bars  OCCUPATION: Retired  PLOF: Independent and Leisure: read and play card games on her phone, has been doing her prior PT HEP recently qod since pain started up  PATIENT GOALS: "No pain."   OBJECTIVE:   DIAGNOSTIC FINDINGS:  05/25/20 - Lumbar x-ray:  FINDINGS: Decreased osseous mineralization. Stable lumbar vertebral body heights and alignment. Mild disc space narrowing is similar. Small endplate osteophytes are present at L2-L3 and L3-L4. Mild facet hypertrophy. Aortic calcification. IMPRESSION: Mild lumbar spondylosis similar to prior study (2018).  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 Moderate foraminal stenosis  on the left at C4-C5. Mild-to-moderate bilateral foraminal stenosis at C5-C6 and mild foraminal stenosis on the right at C3-C4 and C4-C5. Mild to moderate canal stenosis at C3-C4 and C4-C5. Mild canal stenosis at C5-C6.  PATIENT SURVEYS:  NDI 25 / 61 = 50.0%  (11/03/21) FOTO Lumbar = 28; predicted = 46  SCREENING FOR RED FLAGS: Bowel or bladder incontinence: No Spinal tumors: No Cauda equina syndrome: No Compression fracture: No Abdominal aneurysm: No  COGNITION:  Overall cognitive status: Within functional limits for tasks assessed     SENSATION: Light touch: Impaired  numbness in B feet  MUSCLE LENGTH: Hamstrings: mod tight R>L ITB: mod tight R>L Piriformis: mod tight B Hip flexors: NT Quads: NT Heelcord: NT  POSTURE:  rounded shoulders, forward head, decreased lumbar lordosis, and increased thoracic kyphosis  PALPATION: Increased muscle tension and TTP over lumbar paraspinals and R glutes/piriformis and lateral hip  LUMBAR ROM:   Active  AROM  Eval - 09/21/21  10/17/21  11/22/21  Flexion Hands level with knees Hands to mid shins Hands to lower shins  Extension 90% limited 75% limited 25% limited  Right lateral flexion Hand to mid thigh Hand to lateral knee Hand to lateral knee  Left lateral flexion Hand to mid thigh Hand to lateral knee Hand to lateral knee  Right rotation 80% limited 50% limited 25% limited  Left rotation 80% limited 50% limited 25% limited    LOWER EXTREMITY ROM:     Mildly limited primarily due to muscle tightness  LOWER EXTREMITY MMT:    MMT Right eval Left eval Right 10/17/21 Left  10/17/21 Right 11/22/21 Left 11/22/21  Hip flexion 3+ 3+ 3+ 3+ 4 4  Hip extension 3- 3- 3 3- 4 4  Hip abduction 4- 3+ 4 4- 4+ 4+  Hip adduction 3+ 3+ 3+ 3+ 4+ 4+  Hip internal rotation 3+ 3+ 4+ 4+ 5 5  Hip external rotation 3+ 3+ 4- 4- 4 4  Knee flexion 4- 4- 4+ 4+ 5 5  Knee extension 4- 4- _0 Ankle dorsiflexion 3+ 3+ 4- 4- 4+ 4+  Ankle  plantarflexion  3- 3-   4 10 SLS HR 4 10 SLS HR  Ankle inversion        Ankle eversion         (Blank rows = not tested)  LUMBAR SPECIAL TESTS:  Straight leg raise test: Positive on Right  CERVICAL ROM:   Active ROM AROM (deg) Eval 11/01/21   11/22/21  Flexion 29 - pressure 38  Extension 34 - pain 38  Right lateral flexion 20 - pain 26  Left lateral flexion 22 24  Right rotation 48 64  Left rotation 49 - pain 57   (Blank rows = not tested)  UPPER EXTREMITY ROM:  Active ROM Right Eval 11/01/21 Left Eval 11/01/21  Right  11/29/21  Left  11/29/21  Shoulder flexion 126 120 126 125  Shoulder extension      Shoulder abduction 109 104 129 134  Shoulder adduction      Shoulder internal rotation FIR to T12 FIR to T10 FIR to T12 FIR to T10  Shoulder external rotation FER to T1 FER to T1 FER to T3 FER to T3  Elbow flexion      Elbow extension      Wrist flexion      Wrist extension      Wrist ulnar deviation      Wrist radial deviation      Wrist pronation      Wrist supination       (Blank rows = not tested)  UPPER EXTREMITY MMT:  MMT Right Eval 11/01/21 Left Eval 11/01/21  Right  11/29/21  Left  11/29/21  Shoulder flexion 4 4- 4+ 4+  Shoulder extension 4- 3+ 4 4  Shoulder abduction 4 4- 4+ 4  Shoulder adduction      Shoulder internal rotation 4- 4- 4 4  Shoulder external rotation 4- 3+ 4 4  Middle trapezius      Lower trapezius      Elbow flexion      Elbow extension      Wrist flexion      Wrist extension      Wrist ulnar deviation      Wrist radial deviation      Wrist pronation      Wrist supination      Grip strength       (Blank rows = not tested)  CERVICAL SPECIAL TESTS:  Spurling's test: Negative and Distraction test: Positive   TODAY'S TREATMENT:  12/08/21 THERAPEUTIC EXERCISE: to improve flexibility, strength and mobility.  Verbal and tactile cues throughout for technique. NuStep - L6 x 6 min Chin tuck 10 x 5" HEP review and  consolidation with clarifications as follows: Seated scap retraction + shoulder extension with long arm ER 10 x 5" - also demonstrated using wall or doorframe for tactile feedback in standing as pt reports she does this exercise in standing as well at home L  L/R UT stretch 2 x 30" - cues for light overpressure as needed to increase stretch intensity Demonstration of open book stretch in standing at wall Standing GTB scap retraction + lat pull down 10 x 5" (progressed from RTB) Several exercises removed from HEP, leaving the exercises which the pt feels have been most beneficial or routinely performed at home  SELF CARE: Provided clarification of recommeded frequency for HEP as well as how to distinguish between stretches and strengthening exercises   12/05/21 THERAPEUTIC EXERCISE: to improve flexibility, strength and mobility.  Verbal and tactile cues throughout for technique. UE-  L2 x 6 min, 3 min forward, 3 min backward Seated Scapular retractions 5x- manual assistance to facilitate proper positioning and scapular motion Seated thoracic extension over chair 10x- cues to place hands behind head to increase pec stretch Seated thoracic extension with pool noodle along spine for pec stretch 2x15" Seated ER and scapular retraction against pool noodle 2x15- cues for  Sidelying Open book R & L 10x each   11/29/21 THERAPEUTIC EXERCISE: to improve flexibility, strength and mobility.  Verbal and tactile cues throughout for technique. NuStep - L5 x 6 min (UE/LE) Standing RTB B lat pull-down 2 x 10 - cues to drop elbows down as shoulder blades come together Standing RTB scap retraction + shoulder row 10 x 3" Standing RTB scap retraction + shoulder extension to neutral 10 x 3" Standing RTB scap retraction/depression + shoulder ER 10 x 3-5"; back along doorframe to promote scap retraction and upright posture  THERAPEUTIC ACTIVITIES: Shoulder ROM & MMT  MANUAL THERAPY: To promote normalized  muscle tension, improved flexibility, increased ROM, and reduced pain. STM/DTM to L lats, teres group and anterolateral deltoid L scapular mobs - emphasis on scap retraction and depression    PATIENT EDUCATION:  Education details: progress with PT, ongoing PT POC, HEP modification - open book stretch, thoracic extension with chair, seated scapular retraction and shoulder ER, postural awareness, and posture and body mechanics for typical daily postioning, mobility and household tasks Person educated: Patient Education method: Explanation Education comprehension: verbalized understanding and verbal cues required   HOME EXERCISE PROGRAM: Access Code: [email or web address] URL: [email or web address] Date: 12/08/2021 Prepared by: Annie Paras  Exercises - Seated Cervical Retraction  - 2 x daily - 7 x weekly - 2 sets - 10 reps - 3-5 sec hold - Seated Gentle Upper Trapezius Stretch  - 2 x daily - 7 x weekly - 3 reps - 30 sec hold - Seated Shoulder Extension  - 2 x daily - 7 x weekly - 2 sets - 10 reps - 3 sec hold - Cervical Extension AROM with Strap  - 1 x daily - 7 x weekly - 2 sets - 10 reps - 3-5 sec hold - Standing Anatomical Position with Scapular Retraction and Depression at Wall  - 2 x daily - 7 x weekly - 2 sets - 10 reps - 3 sec hold - Seated Thoracic Lumbar Extension with Pectoralis Stretch  - 1 x daily - 7 x weekly - 2 sets - 10 reps - 3 sec hold - Sidelying Open Book  - 1 x daily - 7 x weekly - 2 sets - 10 reps - 3 sec hold - Standing Lat Pull Down with Resistance - Elbows Bent  - 1 x daily - 3 x weekly - 2 sets - 10 reps - 3 sec hold - Hooklying Single Knee to Chest Stretch  - 2-3 x daily - 7 x weekly - 3 reps - 30 sec hold - Supine Piriformis Stretch with Foot on Ground  - 2-3 x daily - 7 x weekly - 3 reps - 30 sec hold - Modified Thomas Stretch (Mirrored)  - 2-3 x daily - 3 x weekly - 3 reps - 30 sec hold - Supine Lower Trunk Rotation  - 2-3 x daily - 7 x weekly - 5 reps - 10  sec hold - Supine Posterior Pelvic Tilt  - 2-3 x daily - 7 x weekly - 2 sets - 10 reps - 5 sec hold - Hooklying Single Leg Bent Knee Fallouts  with Resistance  - 1 x daily - 3 x weekly - 2 sets - 10 reps - 3 sec hold - Supine March with Resistance Band  - 1 x daily - 3 x weekly - 2 sets - 10 reps - 2-3 sec hold hold - Bridge with Resistance  - 1 x daily - 3 x weekly - 2 sets - 10 reps - 5 sec hold - Beginner Clam  - 1 x daily - 3 x weekly - 2 sets - 10 reps - 3 sec hold - Seated Flexion Stretch with Swiss Ball  - 2-3 x daily - 7 x weekly - 3 reps - 30 sec hold - Seated Thoracic Flexion and Rotation with Swiss Ball  - 2-3 x daily - 7 x weekly - 2-3 reps - 30 sec hold - Heel Toe Raises with Counter Support  - 1 x daily - 3 x weekly - 2 sets - 10 reps - 3 sec hold  Patient Education - Posture and Body Mechanics   ASSESSMENT:  CLINICAL IMPRESSION: Mckell reports her pain is "only a little" today in her upper shoulder between her neck and shoulder. She feels that overall her pain has significantly improved and she feels that she will be ready to finish with PT at the end of her current POC. As such, today's visit focusing on review and consolidation of her HEP targeting the exercises she has found most beneficial and providing clarification for technique or advancement of resistance as indicated. Shynia reports good comfort and understanding of HEP following the review/update/consolidation but encouraged her to let us know next visit if she has any further concerns. Will plan for final goal assessment next visit and anticipate transition to her HEP at that time.  OBJECTIVE IMPAIRMENTS: decreased activity tolerance, decreased endurance, decreased knowledge of condition, decreased mobility, difficulty walking, decreased ROM, decreased strength, dizziness, hypomobility, increased fascial restrictions, impaired perceived functional ability, increased muscle spasms, impaired flexibility, impaired  sensation, impaired UE functional use, improper body mechanics, postural dysfunction, and pain.   ACTIVITY LIMITATIONS: carrying, lifting, bending, sitting, standing, squatting, sleeping, stairs, transfers, bed mobility, bathing, toileting, dressing, reach over head, and locomotion level  PARTICIPATION LIMITATIONS: meal prep, cleaning, laundry, driving, shopping, community activity, and yard work  PERSONAL FACTORS: Age, Fitness, Past/current experiences, Time since onset of injury/illness/exacerbation, and 3+ comorbidities: Extensive PMH including chronic back and neck pain, OA, osteoporosis, DM, HTN, h/o TIA, diabetic peripheral neuropathy, PVD, headaches, sleep dysfunction, anxiety (refer to problem list and past medical/surgical history for full PMH)  are also affecting patient's functional outcome.   REHAB POTENTIAL: Good  CLINICAL DECISION MAKING: Evolving/moderate complexity  EVALUATION COMPLEXITY: Moderate   GOALS: Goals reviewed with patient? Yes  SHORT TERM GOALS: Target date: 10/19/2021   Patient will be independent with initial HEP to improve outcomes and carryover.  Baseline:  Goal status: MET  10/17/21  2.  Patient will report centralization of radicular symptoms.  Baseline:  Goal status: MET  12/05/21 - as related to LBP   LONG TERM GOALS: Target date: 11/16/2021, revised to 12/13/21 as of re-eval for neck    Patient will be independent with ongoing/advanced HEP for self-management at home.  Baseline:  Goal status: IN PROGRESS 11/10/21 - Met for current HEP  2.  Patient will report 50-75% improvement in low back pain to improve QOL.  Baseline:  Goal status: MET  11/10/21 - Pt reports back pain currently resolved  3.  Patient to demonstrate ability to achieve and maintain  good spinal alignment/posturing and body mechanics needed for daily activities. Baseline:  Goal status: MET  11/22/21  4.  Patient will demonstrate functional pain free lumbar ROM to perform ADLs.    Baseline:  Goal status: MET  11/22/21 - Lumbar ROM WFL w/o pain reported  5.  Patient will demonstrate improved B LE strength to >/= 4/5 for improved stability and ease of mobility . Baseline:  Goal status: MET  11/22/21 - Refer to above MMT table  6.  Patient will report 35 on lumbar FOTO to demonstrate improved functional ability.  Baseline: 28 Goal status: MET  11/22/21 - Lumbar FOTO = 69   7.  Patient will tolerate 20-30 min of sitting w/o increased pain to improve comfort with eating meals or driving. Baseline:  Goal status: MET  11/22/21 - able to sit for up to 1 hr w/o increased pain  8.  Patient will report 50-75% improvement in neck pain to improve QOL.  Baseline:  Goal status: MET  11/22/21 - Pt reports 80% improvement in pain  9  Patient will demonstrate functional pain free cervical ROM for safety with driving.  Baseline:  Goal status: IN PROGRESS  11/22/21 - Cervical ROM improving in all planes  10.  Patient will demonstrate improved posture to decrease muscle imbalance. Baseline:  Goal status: MET  11/10/21 - Pt demonstrating more upright posture in sitting and standing with decreasing fwd head position  11. Patient will report at least 15% improvement on NDI to demonstrate improved functional ability.    Baseline: 25 / 50 = 50.0% Goal status: IN PROGRESS   12. Patient to report ability to perform ADLs, household, and leisure activities without limitation due to neck pain, LOM or weakness. Baseline:  Goal status: IN PROGRESS   PLAN: PT FREQUENCY: 2x/week  PT DURATION: 6 weeks  PLANNED INTERVENTIONS: Therapeutic exercises, Therapeutic activity, Neuromuscular re-education, Balance training, Gait training, Patient/Family education, Self Care, Joint mobilization, Stair training, Aquatic Therapy, Dry Needling, Electrical stimulation, Spinal mobilization, Cryotherapy, Moist heat, Taping, Traction, Ultrasound, Ionotophoresis 29m/ml Dexamethasone, Manual therapy, and  Re-evaluation.  PLAN FOR NEXT SESSION:  goal assessment; anticipate transition to HEP +/- 30-day hold as indicated   JPercival Spanish PT 12/08/2021, 3:48 PM

## 2021-12-09 ENCOUNTER — Other Ambulatory Visit (HOSPITAL_BASED_OUTPATIENT_CLINIC_OR_DEPARTMENT_OTHER): Payer: Self-pay

## 2021-12-09 ENCOUNTER — Ambulatory Visit (INDEPENDENT_AMBULATORY_CARE_PROVIDER_SITE_OTHER): Payer: Medicare Other | Admitting: Pharmacist

## 2021-12-09 DIAGNOSIS — I10 Essential (primary) hypertension: Secondary | ICD-10-CM

## 2021-12-09 DIAGNOSIS — E1169 Type 2 diabetes mellitus with other specified complication: Secondary | ICD-10-CM

## 2021-12-09 DIAGNOSIS — E7800 Pure hypercholesterolemia, unspecified: Secondary | ICD-10-CM

## 2021-12-09 DIAGNOSIS — E669 Obesity, unspecified: Secondary | ICD-10-CM

## 2021-12-09 MED ORDER — FREESTYLE LIBRE 3 SENSOR MISC
5 refills | Status: DC
  Filled 2021-12-09: qty 2, 28d supply, fill #0

## 2021-12-09 MED ORDER — ROSUVASTATIN CALCIUM 20 MG PO TABS: 20.0000 mg | ORAL_TABLET | Freq: Every day | ORAL | 3 refills | Status: DC

## 2021-12-09 NOTE — Progress Notes (Signed)
Pharmacy Note  12/09/2021 Name: [name] MRN: [id number] DOB: September 21, 1940  Subjective: [name] is a 81 y.o. year old female who is a primary care patient of [name], MD. Clinical Pharmacist Practitioner referral was placed to assist with medication management.    Engaged with patient by telephone for follow up visit today.  Hyperlipidemia: Per Dr Leonie Man LDL gaol < 70. LDL 121 >> 88 >> 95. Taking rosuvastatin 58m daily. She has been working on lowering intake of high fat foods. Patient reports she is taking rosuvastatin daily   Type 2 DM: Patient's A1c increased to 6.7% but still at goal. She reports her blood pressure has been higher after meals - ranging from 175 to 225. She also has had a few blood glucose readings in the 60's - usually occurs 1 or 2 times per month. Taking only metformin currently.  Diet:  Breakfast - boiled egg, pita bread and drained yogurt; coffee with 0.5 tsp of sugar Lunch - fruit and leftovers most day; sometimes has banana sandwich Dinner - fish or chicken + vegetablet - salad, zucchini, cauliflower, green beans or carrots.  Snacks - grapes, apples or banana Drinks - coffee or water.   Hypertension / paroxysmal SVT: Taking amlodipine 569mdaily, losartan 5011mwice a day and metoprolol 10m70mice a day. Last blood pressure was 121/48. Patient mentions that sometimes she feels fatigued when blood pressure is low. She is concerned that losartan will hurt her kidney because when her husband's kidney function worsened he was taken off losartan.   Objective: Review of patient status, including review of consultants reports, laboratory and other test data, was performed as part of comprehensive evaluation and provision of chronic care management services.   Lab Results  Component Value Date   CREATININE 0.85 10/11/2021   CREATININE 0.91 08/05/2021   CREATININE 0.91 07/05/2021    Lab Results  Component Value Date   HGBA1C 6.7 (H)  10/11/2021       Component Value Date/Time   CHOL 185 10/11/2021 0928   TRIG 122.0 10/11/2021 0928   TRIG 94 11/16/2005 0905   HDL 66.00 10/11/2021 0928   CHOLHDL 3 10/11/2021 0928   VLDL 24.4 10/11/2021 0928   LDLCALC 95 10/11/2021 0928   LDLCALC 116 (H) 11/13/2019 0906   LDLDIRECT 95.0 03/25/2020 1149     Clinical ASCVD: Yes  The ASCVD Risk score (Arnett DK, et al., 2019) failed to calculate for the following reasons:   The 2019 ASCVD risk score is only valid for ages 40 t3379  57BP Readings from Last 3 Encounters:  11/04/21 (!) 121/48  10/13/21 118/62  09/06/21 124/60     Allergies  Allergen Reactions   Tramadol Other (See Comments)    Dizziness     Medications Reviewed Today     Reviewed by EckaCherre RobinsH-CPP (Pharmacist) on 12/09/21 at 1339[place] Status: <None>   Medication Order Taking? Sig Documenting Provider Last Dose Status Informant  acetaminophen (TYLENOL) 325 MG tablet [id number] Take 2 tablets (650 mg total) by mouth every 6 (six) hours as needed for up to 30 doses for mild pain or moderate pain. TrifWyvonnia Dusky Taking Active   amLODipine (NORVASC) 5 MG tablet [id number] TAKE 1 TABLET BY MOUTH  DAILY HochMinus Breeding Taking Active   aspirin EC 81 MG tablet [id number] Take 81 mg by mouth daily. Swallow whole. [provider] Taking Active Self  betamethasone valerate ointment (VALISONE) 0.1 % [id number] Yes Apply a small amount to affected area topically every other day. [name], MD Taking Active   Blood Glucose Monitoring Suppl (ONE TOUCH ULTRA 2) w/Device Drucie Opitz [id number] Yes USE TO CHECK BLOOD SUGAR AS DIRECTED [name], MD Taking Active   cholecalciferol (VITAMIN D) 1000 UNITS tablet [id number] Yes Take 1,000 Units by mouth daily. [provider] Taking Active Self  dicyclomine (BENTYL) 10 MG capsule [id number] Yes TAKE 1 CAPSULE BY MOUTH 4 TIMES  DAILY AS NEEDED Gatha Mayer, MD Taking Active    furosemide (LASIX) 20 MG tablet [id number] Yes TAKE 1 TABLET BY MOUTH  DAILY AS NEEDED [name], MD Taking Active   Lancets [facility] ULTRASOFT) lancets [id number] Yes USE AS DIRECTED 3 TIMES  DAILY [name], MD Taking Active   losartan (COZAAR) 50 MG tablet [id number] Yes TAKE 1 TABLET BY MOUTH TWICE  DAILY [name], MD Taking Active   metFORMIN (GLUCOPHAGE-XR) 500 MG 24 hr tablet [id number] Yes TAKE 1 TABLET BY MOUTH IN  THE MORNING AND AT BEDTIME [name], MD Taking Active   metoCLOPramide (REGLAN) 5 MG tablet [id number] Yes TAKE 1 TABLET BY MOUTH TWICE  DAILY AS NEEDED Gatha Mayer, MD Taking Active   metoprolol tartrate (LOPRESSOR) 50 MG tablet [id number] Yes TAKE 1 AND 1/2 TABLETS BY  MOUTH TWICE DAILY Minus Breeding, MD Taking Active   NEXIUM 40 MG capsule [id number] Yes Take 1 capsule (40 mg total) by mouth daily before breakfast. Gatha Mayer, MD Taking Active            Med Note [facility], [facility]   Fri Jun 03, 2021  1:26 PM) Using over-the-counter   ondansetron (ZOFRAN) 4 MG tablet [id number] Yes Take 4 mg by mouth every 4 (four) hours as needed. For nausea Renato Shin, MD Taking Active Self  Donald Siva test strip [id number] Yes CHECK BLOOD SUGAR 3 TIMES  DAILY OR AS NEEDED [name], MD Taking Active   potassium chloride (KLOR-CON M) 10 MEQ tablet [id number] Yes TAKE 1 TABLET BY MOUTH  DAILY [name], MD Taking Active   rosuvastatin (CRESTOR) 20 MG tablet [id number] Yes TAKE 1 TABLET BY MOUTH  DAILY [name], MD Taking Active   thiamine (VITAMIN B-1) 100 MG tablet [id number] No Take 100 mg by mouth every other day.  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   Renal insufficiency 06/18/2019   Educated about COVID-19 virus infection 05/15/2019   Atrophic vaginitis 01/20/2019   Stenosis of carotid artery 11/12/2018   Anxiety 10/13/2018   Thrush 12/07/2017   Sinusitis 10/11/2017   Headache 07/12/2017   Dizzy spells 11/21/2016   Constipation 11/21/2016   Nonrheumatic aortic valve stenosis 05/23/2016   Aortic atherosclerosis ([place]) 05/23/2016   Hematuria 03/16/2016   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   Pain of upper abdomen 06/06/2015   Neck pain 04/22/2015   Ear pain 02/28/2015   Abnormal urine 11/21/2014   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   Anemia 06/08/2014   RLS (restless legs syndrome) 11/23/2013   Diabetic peripheral neuropathy ([place]) 10/29/2013   Left-sided thoracic back pain 10/06/2013   Encounter for preventative adult health care exam with abnormal findings 09/14/2013   Iron deficiency anemia    Status post laparoscopy 02/25/2013   Hyponatremia 01/09/2013   GERD (gastroesophageal reflux disease) 01/09/2013   Amaurosis fugax of left eye 10/16/2012   Low back pain 06/03/2012   Vitamin D deficiency 03/11/2012   Bilateral hand pain 10/20/2011   Encounter for long-term (current) use of other medications 10/20/2011   IBS (irritable bowel syndrome) 08/14/2011   TIA (transient ischemic attack) 02/10/2011   Abnormal brain CT 01/19/2011   Allergic rhinitis 10/01/2010   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  Patient affirms current coverage meets needs.   Assessment / Plan: Hyperlipidemia: Per Dr Leonie Man LDL gaol < 70.  Continue rosuvastatin 73m daily.If LDL still > 70 when rechecked in Feb 2024, will either increase rosuvastatin or consider adding ezetimibe.  Continue low fat diet.  Type 2 DM: Patient's A1c increased to 6.7% but still at goal. Having elevated post prandial blood glucose from time to time and lows occasionally.  Continue metformin ER 5034mtwice a day.  Due to gastroparesis - GLP1 / [employer] [place] not a good option for post prandial highs.  Could consider Prandin with largest meal in future.  Continue to check blood pressure 3 times a day. Checked to see if patient would use voucher to get [facility] Continuous Glucose Monitor for $75 but not allowed since she has Medicare.  We tried to get Continuous Glucose Monitor thru her Medicare plan but was denied.   Discussed not eating banana sandwiches. There are high in CHO's. Recommended she use lean sandwich meat instead.  Discussed serving sizes for fruit servings - about 15 grapes, apple the size of tennis or baseball, 0.5 of a banana.    Hypertension / paroxysmal SVT: recently had some low blood pressure readings Taking amlodipine 39m46maily, losartan 75m74mice a day and metoprolol 739mg22mce a day.  Patient will discuss with Dr HocheJenkins Rougeppt 12/15/2021 about possibly lowering dose of either metoprolol to 75mg 239mor losartan to 75mg d43m.    Follow Up:  Telephone follow up appointment with care management team member scheduled for:  3 months   Stefan Markarian ECherre RobinsD Clinical Pharmacist [facility] [phone]

## 2021-12-12 ENCOUNTER — Encounter: Payer: Self-pay | Admitting: Physical Therapy

## 2021-12-12 ENCOUNTER — Ambulatory Visit: Payer: Medicare Other | Admitting: Physical Therapy

## 2021-12-12 DIAGNOSIS — M5412 Radiculopathy, cervical region: Secondary | ICD-10-CM | POA: Diagnosis not present

## 2021-12-12 DIAGNOSIS — R262 Difficulty in walking, not elsewhere classified: Secondary | ICD-10-CM

## 2021-12-12 DIAGNOSIS — R2689 Other abnormalities of gait and mobility: Secondary | ICD-10-CM

## 2021-12-12 DIAGNOSIS — M542 Cervicalgia: Secondary | ICD-10-CM | POA: Diagnosis not present

## 2021-12-12 DIAGNOSIS — M6281 Muscle weakness (generalized): Secondary | ICD-10-CM

## 2021-12-12 DIAGNOSIS — M5459 Other low back pain: Secondary | ICD-10-CM

## 2021-12-12 DIAGNOSIS — R293 Abnormal posture: Secondary | ICD-10-CM | POA: Diagnosis not present

## 2021-12-12 DIAGNOSIS — R252 Cramp and spasm: Secondary | ICD-10-CM | POA: Diagnosis not present

## 2021-12-12 DIAGNOSIS — M62830 Muscle spasm of back: Secondary | ICD-10-CM

## 2021-12-12 NOTE — Therapy (Addendum)
OUTPATIENT PHYSICAL THERAPY TREATMENT / PROGRESS NOTE / DISCHARGE SUMMARY  Patient Name: [name] MRN: [id number] DOB:08/15/1940, 81 y.o., female Today's Date: 12/12/2021  Progress Note  Reporting Period 11/01/2021 to 12/12/2021  See note below for Objective Data and Assessment of Progress/Goals.       PT End of Session - 12/12/21 1315     Visit Number 18    Date for PT Re-Evaluation 12/13/21    Authorization Type Medicare    Progress Note Due on Visit 20    PT Start Time 1315    PT Stop Time 1345    PT Time Calculation (min) 30 min    Activity Tolerance Patient tolerated treatment well    Behavior During Therapy [facility] for tasks assessed/performed                       Past Medical History:  Diagnosis Date   Abdominal pain in female 03/18/2010   Qualifier: Diagnosis of  By: Carlean Purl MD, Tonna Boehringer E    Anemia 06/08/2014   Anxiety    Arthritis    Spinal Osteoarthritis   Cancer ([place])    Carcinoid tumor of stomach    Cataract    Chest pain    Myoview 12/15 no ischemia.   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 ([place]) 09/05/2006   Qualifier: Diagnosis of  By: Marca Ancona RMA, Lucy     Diabetic peripheral neuropathy ([place]) 10/29/2013   Encounter for preventative adult health care exam with abnormal findings 09/14/2013   Esophageal reflux    Gastric polyp    Fundic Gland   Gastroparesis    Headache(784.0)    Heart murmur    Echocardiogram 2/11: EF 60-65%, mild LAE, grade 1 diastolic dysfunction, aortic valve sclerosis, mean gradient 9 mm of mercury, PASP 34   Hematuria 03/16/2016   Iron deficiency anemia, unspecified    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   Leg swelling    bilateral   Neck pain 04/22/2015   PONV (postoperative nausea and vomiting)    pt states only needs small amount of anesthesia   PSVT (paroxysmal supraventricular tachycardia)    Pure hypercholesterolemia     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   Renal insufficiency 06/18/2019   Stroke ([place])    tia, 2014   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   Type II or unspecified type diabetes mellitus without mention of complication, not stated as uncontrolled    Unspecified essential hypertension    Unspecified hereditary and idiopathic peripheral neuropathy 10/29/2013   Past Surgical History:  Procedure Laterality Date   CHOLECYSTECTOMY  1993   COLONOSCOPY  11/11/2010   diverticulosis   DILATATION & CURRETTAGE/HYSTEROSCOPY WITH RESECTOCOPE N/A 02/25/2013   Procedure: Attempted hysteroscopy with uterine perforation;  Surgeon: Jamey Reas de Berton Lan, MD;  Location: [facility];  Service: Gynecology;  Laterality: N/A;   ESOPHAGOGASTRODUODENOSCOPY  08/29/2010; 09/15/2010   Carcinoid tumor less than 1 cm in July 2012 not seen in August 2012 , gastritis, fundic gland polyps   ESOPHAGOGASTRODUODENOSCOPY  05/16/2011   ESOPHAGOGASTRODUODENOSCOPY  06/14/2012   EUS  12/15/2010   Procedure: UPPER ENDOSCOPIC ULTRASOUND (EUS) LINEAR;  Surgeon: Owens Loffler, MD;  Location: [facility];  Service: Endoscopy;  Laterality: N/A;   EYE SURGERY Bilateral    Bi lateral cateracts and bi lateral laser   LAPAROSCOPY N/A 02/25/2013   Procedure: Cystoscopy and laparoscopy with fulguration  of uterine serosa;  Surgeon: Jamey Reas de Berton Lan, MD;  Location: [facility];  Service: Gynecology;  Laterality: N/A;   TONSILLECTOMY     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   Renal insufficiency 06/18/2019   Educated about COVID-19 virus infection 05/15/2019   Atrophic vaginitis 01/20/2019   Stenosis of carotid artery 11/12/2018   Anxiety 10/13/2018    Thrush 12/07/2017   Sinusitis 10/11/2017   Headache 07/12/2017   Dizzy spells 11/21/2016   Constipation 11/21/2016   Nonrheumatic aortic valve stenosis 05/23/2016   Aortic atherosclerosis ([facility]) 05/23/2016   Hematuria 03/16/2016   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   Pain of upper abdomen 06/06/2015   Neck pain 04/22/2015   Ear pain 02/28/2015   Abnormal urine 11/21/2014   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   Anemia 06/08/2014   RLS (restless legs syndrome) 11/23/2013   Diabetic peripheral neuropathy ([place]) 10/29/2013   Left-sided thoracic back pain 10/06/2013   Encounter for preventative adult health care exam with abnormal findings 09/14/2013   Iron deficiency anemia    Status post laparoscopy 02/25/2013   Hyponatremia 01/09/2013   GERD (gastroesophageal reflux disease) 01/09/2013   Amaurosis fugax of left eye 10/16/2012   Low back pain 06/03/2012   Vitamin D deficiency 03/11/2012   Bilateral hand pain 10/20/2011   Encounter for long-term (current) use of other medications 10/20/2011   IBS (irritable bowel syndrome) 08/14/2011   TIA (transient ischemic attack) 02/10/2011   Abnormal brain CT 01/19/2011   Allergic rhinitis 10/01/2010   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   FUNDIC GLAND POLYPS OF STOMACH 03/18/2010   Abdominal pain in female 03/18/2010   Pain in joint 54/36/0677   SYSTOLIC MURMUR 03/40/3524   Paroxysmal supraventricular tachycardia 01/12/2009   PLANTAR FASCIITIS 06/08/2008   CHEST PAIN 05/18/2008   Gastroparesis 12/18/2007   HYPERCHOLESTEROLEMIA 06/11/2007   Diabetes mellitus type 2 in obese ([place]) 09/05/2006   Essential hypertension 09/05/2006    PCP: [name], MD  REFERRING PROVIDER: Marrian Salvage, FNP (low back pain) & [name], MD (neck pain)  REFERRING DIAG:  M54.41,G89.29 (ICD-10-CM) - Chronic right-sided low back pain with right-sided sciatica M54.2 (ICD-10-CM) - Neck pain  RATIONALE FOR  EVALUATION AND TREATMENT: Rehabilitation  THERAPY DIAG:  Cervicalgia  Radiculopathy, cervical region  Abnormal posture  Cramp and spasm  Other low back pain  Muscle spasm of back  Muscle weakness (generalized)  Difficulty in walking, not elsewhere classified  Other abnormalities of gait and mobility  ONSET DATE: acute on chronic exacerbation ~3 weeks ago; neck pain - chronic   NEXT MD VISIT: 03/28/22 with PCP   SUBJECTIVE:  SUBJECTIVE STATEMENT: Pt reports doing well since last visit but is having pain in her L upper shoulder and neck.  PAIN:  Are you having pain? Yes: NPRS scale: 2/10 Pain location: L upper shoulder Pain description: discomfort  Are you having pain? No - Low back  PERTINENT HISTORY: Extensive PMH including chronic back and neck pain, OA, osteoporosis, DM, HTN, h/o TIA, diabetic peripheral neuropathy, PVD, headaches, sleep dysfunction, anxiety (refer to problem list and past medical/surgical history for full PMH)  PRECAUTIONS: None  WEIGHT BEARING RESTRICTIONS: No  FALLS:  Has patient fallen in last 6 months? No  LIVING ENVIRONMENT: Lives with: lives alone Lives in: House/apartment Stairs: No Has following equipment at home: Single point cane, Environmental consultant - 4 wheeled, shower chair, and Grab bars  OCCUPATION: Retired  PLOF: Independent and Leisure: read and play card games on her phone, has been doing her prior PT HEP recently qod since pain started up  PATIENT GOALS: "No pain."   OBJECTIVE:   DIAGNOSTIC FINDINGS:  05/25/20 - Lumbar x-ray:  FINDINGS: Decreased osseous mineralization. Stable lumbar vertebral body heights and alignment. Mild disc space narrowing is similar. Small endplate osteophytes are present at L2-L3 and L3-L4. Mild facet hypertrophy. Aortic  calcification. IMPRESSION: Mild lumbar spondylosis similar to prior study (2018).  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 Moderate foraminal stenosis on the left at C4-C5. Mild-to-moderate bilateral foraminal stenosis at C5-C6 and mild foraminal stenosis on the right at C3-C4 and C4-C5. Mild to moderate canal stenosis at C3-C4 and C4-C5. Mild canal stenosis at C5-C6.  PATIENT SURVEYS:  NDI 25 / 65 = 50.0%  (11/03/21) FOTO Lumbar = 28; predicted = 46  SCREENING FOR RED FLAGS: Bowel or bladder incontinence: No Spinal tumors: No Cauda equina syndrome: No Compression fracture: No Abdominal aneurysm: No  COGNITION:  Overall cognitive status: Within functional limits for tasks assessed     SENSATION: Light touch: Impaired  numbness in B feet  MUSCLE LENGTH: Hamstrings: mod tight R>L ITB: mod tight R>L Piriformis: mod tight B Hip flexors: NT Quads: NT Heelcord: NT  POSTURE:  rounded shoulders, forward head, decreased lumbar lordosis, and increased thoracic kyphosis  PALPATION: Increased muscle tension and TTP over lumbar paraspinals and R glutes/piriformis and lateral hip  LUMBAR ROM:   Active  AROM  Eval - 09/21/21  10/17/21  11/22/21  Flexion Hands level with knees Hands to mid shins Hands to lower shins  Extension 90% limited 75% limited 25% limited  Right lateral flexion Hand to mid thigh Hand to lateral knee Hand to lateral knee  Left lateral flexion Hand to mid thigh Hand to lateral knee Hand to lateral knee  Right rotation 80% limited 50% limited 25% limited  Left rotation 80% limited 50% limited 25% limited    LOWER EXTREMITY ROM:     Mildly limited primarily due to muscle tightness  LOWER EXTREMITY MMT:    MMT Right eval Left eval Right 10/17/21 Left  10/17/21 Right 11/22/21 Left 11/22/21  Hip flexion 3+ 3+ 3+ 3+ 4 4  Hip extension 3- 3- 3 3- 4 4  Hip abduction 4- 3+ 4 4- 4+ 4+  Hip adduction 3+ 3+ 3+ 3+ 4+  4+  Hip internal rotation 3+ 3+ 4+ 4+ 5 5  Hip external rotation 3+ 3+ 4- 4- 4 4  Knee flexion 4- 4- 4+ 4+ 5 5  Knee extension 4- 4- _0 Ankle dorsiflexion 3+ 3+ 4- 4-  4+ 4+  Ankle plantarflexion 3- 3-   4 10 SLS HR 4 10 SLS HR  Ankle inversion        Ankle eversion         (Blank rows = not tested)  LUMBAR SPECIAL TESTS:  Straight leg raise test: Positive on Right  CERVICAL ROM:   Active ROM AROM (deg) Eval 11/01/21   11/22/21   12/12/21  Flexion 29 - pressure 38 35  Extension 34 - pain 38 33  Right lateral flexion 20 - pain 26 30  Left lateral flexion _0 Right rotation 48 64 69  Left rotation 49 - pain 57 56   (Blank rows = not tested)  UPPER EXTREMITY ROM:  Active ROM Right Eval 11/01/21 Left Eval 11/01/21  Right  11/29/21  Left  11/29/21  Shoulder flexion 126 120 126 125  Shoulder extension      Shoulder abduction 109 104 129 134  Shoulder adduction      Shoulder internal rotation FIR to T12 FIR to T10 FIR to T12 FIR to T10  Shoulder external rotation FER to T1 FER to T1 FER to T3 FER to T3  Elbow flexion      Elbow extension      Wrist flexion      Wrist extension      Wrist ulnar deviation      Wrist radial deviation      Wrist pronation      Wrist supination       (Blank rows = not tested)  UPPER EXTREMITY MMT:  MMT Right Eval 11/01/21 Left Eval 11/01/21  Right  11/29/21  Left  11/29/21  Shoulder flexion 4 4- 4+ 4+  Shoulder extension 4- 3+ 4 4  Shoulder abduction 4 4- 4+ 4  Shoulder adduction      Shoulder internal rotation 4- 4- 4 4  Shoulder external rotation 4- 3+ 4 4  Middle trapezius      Lower trapezius      Elbow flexion      Elbow extension      Wrist flexion      Wrist extension      Wrist ulnar deviation      Wrist radial deviation      Wrist pronation      Wrist supination      Grip strength       (Blank rows = not tested)  CERVICAL SPECIAL TESTS:  Spurling's test: Negative and Distraction test:  Positive   TODAY'S TREATMENT: 12/12/21 THERAPEUTIC EXERCISE: to improve flexibility, strength and mobility.  Verbal and tactile cues throughout for technique. NuStep L5 x 6 min  THERAPEUTIC ACTIVITIES: Cervical ROM measurements NDI: 8 / 50 = 16.0%   12/08/21 THERAPEUTIC EXERCISE: to improve flexibility, strength and mobility.  Verbal and tactile cues throughout for technique. NuStep - L6 x 6 min Chin tuck 10 x 5" HEP review and consolidation with clarifications as follows: Seated scap retraction + shoulder extension with long arm ER 10 x 5" - also demonstrated using wall or doorframe for tactile feedback in standing as pt reports she does this exercise in standing as well at home L  L/R UT stretch 2 x 30" - cues for light overpressure as needed to increase stretch intensity Demonstration of open book stretch in standing at wall Standing GTB scap retraction + lat pull down 10 x 5" (progressed from RTB) Several exercises removed from HEP, leaving the exercises which the pt feels have  been most beneficial or routinely performed at home  SELF CARE: Provided clarification of recommeded frequency for HEP as well as how to distinguish between stretches and strengthening exercises   12/05/21 THERAPEUTIC EXERCISE: to improve flexibility, strength and mobility.  Verbal and tactile cues throughout for technique. UE- L2 x 6 min, 3 min forward, 3 min backward Seated Scapular retractions 5x- manual assistance to facilitate proper positioning and scapular motion Seated thoracic extension over chair 10x- cues to place hands behind head to increase pec stretch Seated thoracic extension with pool noodle along spine for pec stretch 2x15" Seated ER and scapular retraction against pool noodle 2x15- cues for  Sidelying Open book R & L 10x each   PATIENT EDUCATION:  Education details: progress with PT and discharge plans Person educated: Patient Education method: Explanation Education comprehension:  verbalized understanding and verbal cues required   HOME EXERCISE PROGRAM: Access Code: [email or web address] URL: [email or web address] Date: 12/08/2021 Prepared by: Annie Paras  Exercises - Seated Cervical Retraction  - 2 x daily - 7 x weekly - 2 sets - 10 reps - 3-5 sec hold - Seated Gentle Upper Trapezius Stretch  - 2 x daily - 7 x weekly - 3 reps - 30 sec hold - Seated Shoulder Extension  - 2 x daily - 7 x weekly - 2 sets - 10 reps - 3 sec hold - Cervical Extension AROM with Strap  - 1 x daily - 7 x weekly - 2 sets - 10 reps - 3-5 sec hold - Standing Anatomical Position with Scapular Retraction and Depression at Wall  - 2 x daily - 7 x weekly - 2 sets - 10 reps - 3 sec hold - Seated Thoracic Lumbar Extension with Pectoralis Stretch  - 1 x daily - 7 x weekly - 2 sets - 10 reps - 3 sec hold - Sidelying Open Book  - 1 x daily - 7 x weekly - 2 sets - 10 reps - 3 sec hold - Standing Lat Pull Down with Resistance - Elbows Bent  - 1 x daily - 3 x weekly - 2 sets - 10 reps - 3 sec hold - Hooklying Single Knee to Chest Stretch  - 2-3 x daily - 7 x weekly - 3 reps - 30 sec hold - Supine Piriformis Stretch with Foot on Ground  - 2-3 x daily - 7 x weekly - 3 reps - 30 sec hold - Modified Thomas Stretch (Mirrored)  - 2-3 x daily - 3 x weekly - 3 reps - 30 sec hold - Supine Lower Trunk Rotation  - 2-3 x daily - 7 x weekly - 5 reps - 10 sec hold - Supine Posterior Pelvic Tilt  - 2-3 x daily - 7 x weekly - 2 sets - 10 reps - 5 sec hold - Hooklying Single Leg Bent Knee Fallouts with Resistance  - 1 x daily - 3 x weekly - 2 sets - 10 reps - 3 sec hold - Supine March with Resistance Band  - 1 x daily - 3 x weekly - 2 sets - 10 reps - 2-3 sec hold hold - Bridge with Resistance  - 1 x daily - 3 x weekly - 2 sets - 10 reps - 5 sec hold - Beginner Clam  - 1 x daily - 3 x weekly - 2 sets - 10 reps - 3 sec hold - Seated Flexion Stretch with Swiss Ball  - 2-3 x daily - 7 x weekly - 3 reps -  30 sec hold -  Seated Thoracic Flexion and Rotation with Swiss Ball  - 2-3 x daily - 7 x weekly - 2-3 reps - 30 sec hold - Heel Toe Raises with Counter Support  - 1 x daily - 3 x weekly - 2 sets - 10 reps - 3 sec hold  Patient Education - Posture and Body Mechanics   ASSESSMENT:  CLINICAL IMPRESSION: Vita reports minimal pain in her upper shoulder and neck. She feels that her pain overall has improved by 90% and that she is ready for PT discharge with HEP. She feels she can manage on her own with her understanding of HEP. Misha was able to meet all therapy goals and will be discharged from current POC and placed on 30 day hold as needed.  OBJECTIVE IMPAIRMENTS: decreased activity tolerance, decreased endurance, decreased knowledge of condition, decreased mobility, difficulty walking, decreased ROM, decreased strength, dizziness, hypomobility, increased fascial restrictions, impaired perceived functional ability, increased muscle spasms, impaired flexibility, impaired sensation, impaired UE functional use, improper body mechanics, postural dysfunction, and pain.   ACTIVITY LIMITATIONS: carrying, lifting, bending, sitting, standing, squatting, sleeping, stairs, transfers, bed mobility, bathing, toileting, dressing, reach over head, and locomotion level  PARTICIPATION LIMITATIONS: meal prep, cleaning, laundry, driving, shopping, community activity, and yard work  PERSONAL FACTORS: Age, Fitness, Past/current experiences, Time since onset of injury/illness/exacerbation, and 3+ comorbidities: Extensive PMH including chronic back and neck pain, OA, osteoporosis, DM, HTN, h/o TIA, diabetic peripheral neuropathy, PVD, headaches, sleep dysfunction, anxiety (refer to problem list and past medical/surgical history for full PMH)  are also affecting patient's functional outcome.   REHAB POTENTIAL: Good  CLINICAL DECISION MAKING: Evolving/moderate complexity  EVALUATION COMPLEXITY: Moderate   GOALS: Goals  reviewed with patient? Yes  SHORT TERM GOALS: Target date: 10/19/2021   Patient will be independent with initial HEP to improve outcomes and carryover.  Baseline:  Goal status: MET  10/17/21  2.  Patient will report centralization of radicular symptoms.  Baseline:  Goal status: MET  12/05/21 - as related to LBP   LONG TERM GOALS: Target date: 11/16/2021, revised to 12/13/21 as of re-eval for neck    Patient will be independent with ongoing/advanced HEP for self-management at home.  Baseline:  Goal status: MET 12/12/21  2.  Patient will report 50-75% improvement in low back pain to improve QOL.  Baseline:  Goal status: MET  11/10/21 - Pt reports back pain currently resolved  3.  Patient to demonstrate ability to achieve and maintain good spinal alignment/posturing and body mechanics needed for daily activities. Baseline:  Goal status: MET  11/22/21  4.  Patient will demonstrate functional pain free lumbar ROM to perform ADLs.   Baseline:  Goal status: MET  11/22/21 - Lumbar ROM WFL w/o pain reported  5.  Patient will demonstrate improved B LE strength to >/= 4/5 for improved stability and ease of mobility . Baseline:  Goal status: MET  11/22/21 - Refer to above MMT table  6.  Patient will report 40 on lumbar FOTO to demonstrate improved functional ability.  Baseline: 28 Goal status: MET  11/22/21 - Lumbar FOTO = 69   7.  Patient will tolerate 20-30 min of sitting w/o increased pain to improve comfort with eating meals or driving. Baseline:  Goal status: MET  11/22/21 - able to sit for up to 1 hr w/o increased pain  8.  Patient will report 50-75% improvement in neck pain to improve QOL.  Baseline:  Goal status: MET  11/22/21 - Pt reports 80% improvement in pain  9  Patient will demonstrate functional pain free cervical ROM for safety with driving.  Baseline:  Goal status: MET  12/12/21 Cervical ROM improved in rotation and lateral bending ROM  10.  Patient will demonstrate  improved posture to decrease muscle imbalance. Baseline:  Goal status: MET  11/10/21 - Pt demonstrating more upright posture in sitting and standing with decreasing fwd head position  11. Patient will report at least 15% improvement on NDI to demonstrate improved functional ability.    Baseline: 25 / 50 = 50.0% Goal status: MET 12/12/21 -  8/50 = 16.0%  12. Patient to report ability to perform ADLs, household, and leisure activities without limitation due to neck pain, LOM or weakness. Baseline:  Goal status: MET 12/12/21 Pt reported ability to perform household activities and cleaning.    PLAN: PT FREQUENCY: 2x/week  PT DURATION: 6 weeks  PLANNED INTERVENTIONS: Therapeutic exercises, Therapeutic activity, Neuromuscular re-education, Balance training, Gait training, Patient/Family education, Self Care, Joint mobilization, Stair training, Aquatic Therapy, Dry Needling, Electrical stimulation, Spinal mobilization, Cryotherapy, Moist heat, Taping, Traction, Ultrasound, Ionotophoresis 48m/ml Dexamethasone, Manual therapy, and Re-evaluation.  PLAN FOR NEXT SESSION:  transition to HEP with 30-day hold    OZeb Comfort Student-PT 12/12/2021, 1:55 PM   PHYSICAL THERAPY DISCHARGE SUMMARY  Visits from Start of Care: 18  Current functional level related to goals / functional outcomes:   Refer to above clinical impression and goal assessment for status as of last visit on 12/12/2021. Patient was placed on hold for 30 days and has not needed to return to PT, therefore will proceed with discharge from PT for this episode.    Remaining deficits:   As above.   Education / Equipment:   HEP, pBiomedical scientisteducation  Patient agrees to discharge. Patient goals were met. Patient is being discharged due to meeting the stated rehab goals.  JPercival Spanish PT, MPT 01/24/22, 11:18 AM  [facility] [place] [address]Phone: [phone]  Fax:  [phone]

## 2021-12-14 NOTE — Progress Notes (Unsigned)
Cardiology Office Note   Date:  12/15/2021   ID:  [name], DOB 1940/10/08, MRN [id number]  PCP:  [name], MD  Cardiologist:   Minus Breeding, MD   Chief Complaint  Patient presents with   Dizziness       History of Present Illness: [name] is a 81 y.o. female who  presents for followup of SVT.  She was seeing neurology and being worked up for some facial numbness not.  Diagnosis of trigeminal neuralgia.  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  She was taking gabapentin for a while but she stopped taking this.  She is not seeing neurology anymore.  I did refer her to physical therapy and she is enjoying this.  She did a lot of stretching and this is helped.  She still doing this.  She does not bicycle a couple times per week when she goes to physical therapy.  She is not having any chest pressure, neck or arm discomfort.  She is not having any palpitations.  She does get dizzy and lightheaded and she shows me some blood pressures.  These are fluctuating somewhat.  She feels dizzy when her blood pressure is low and she reports some systolics in the 17G.  Heart rates are in the 50s to 60s routinely.  She has not had any frank syncope.  She has not had any tachypalpitations that she had previously.   Past Medical History:  Diagnosis Date   Abdominal pain in female 03/18/2010   Qualifier: Diagnosis of  By: Carlean Purl MD, Dimas Millin    Anemia 06/08/2014   Anxiety    Arthritis    Spinal Osteoarthritis   Cancer ([place])    Carcinoid tumor of stomach    Cataract    Chest pain    Myoview 12/15 no ischemia.   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 ([place]) 09/05/2006   Qualifier: Diagnosis of  By: Marca Ancona RMA, Lucy     Diabetic peripheral neuropathy ([place]) 10/29/2013   Encounter for preventative adult health care exam with abnormal  findings 09/14/2013   Esophageal reflux    Gastric polyp    Fundic Gland   Gastroparesis    Headache(784.0)    Heart murmur    Echocardiogram 2/11: EF 60-65%, mild LAE, grade 1 diastolic dysfunction, aortic valve sclerosis, mean gradient 9 mm of mercury, PASP 34   Hematuria 03/16/2016   Iron deficiency anemia, unspecified    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   Leg swelling    bilateral   Neck pain 04/22/2015   PONV (postoperative nausea and vomiting)    pt states only needs small amount of anesthesia   PSVT (paroxysmal supraventricular tachycardia)    Pure hypercholesterolemia    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   Renal insufficiency 06/18/2019   Stroke ([place])    tia, 2014   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   Type II or unspecified type diabetes mellitus without mention of complication, not stated as uncontrolled    Unspecified essential hypertension    Unspecified hereditary and idiopathic peripheral neuropathy 10/29/2013    Past Surgical History:  Procedure Laterality Date   CHOLECYSTECTOMY  1993   COLONOSCOPY  11/11/2010   diverticulosis   DILATATION & CURRETTAGE/HYSTEROSCOPY WITH RESECTOCOPE N/A 02/25/2013   Procedure: Attempted hysteroscopy with uterine perforation;  Surgeon: Jamey Reas de Abigail Butts  Ebbie Latus, MD;  Location: [facility];  Service: Gynecology;  Laterality: N/A;   ESOPHAGOGASTRODUODENOSCOPY  08/29/2010; 09/15/2010   Carcinoid tumor less than 1 cm in July 2012 not seen in August 2012 , gastritis, fundic gland polyps   ESOPHAGOGASTRODUODENOSCOPY  05/16/2011   ESOPHAGOGASTRODUODENOSCOPY  06/14/2012   EUS  12/15/2010   Procedure: UPPER ENDOSCOPIC ULTRASOUND (EUS) LINEAR;  Surgeon: Owens Loffler, MD;  Location: [facility];  Service: Endoscopy;  Laterality: N/A;   EYE SURGERY Bilateral    Bi lateral cateracts and bi lateral laser   LAPAROSCOPY N/A 02/25/2013   Procedure: Cystoscopy and laparoscopy with fulguration of uterine serosa;  Surgeon: Jamey Reas de Berton Lan, MD;  Location: [facility];   Service: Gynecology;  Laterality: N/A;   TONSILLECTOMY       Current Outpatient Medications  Medication Sig Dispense Refill   acetaminophen (TYLENOL) 325 MG tablet Take 2 tablets (650 mg total) by mouth every 6 (six) hours as needed for up to 30 doses for mild pain or moderate pain. 30 tablet 0   amLODipine (NORVASC) 5 MG tablet TAKE 1 TABLET BY MOUTH  DAILY 90 tablet 3   aspirin EC 81 MG tablet Take 81 mg by mouth daily. Swallow whole.     betamethasone valerate ointment (VALISONE) 0.1 % Apply a small amount to affected area topically every other day. 45 g 1   Blood Glucose Monitoring Suppl (ONE TOUCH ULTRA 2) w/Device KIT USE TO CHECK BLOOD SUGAR AS DIRECTED 1 kit 0   cholecalciferol (VITAMIN D) 1000 UNITS tablet Take 1,000 Units by mouth daily.     dicyclomine (BENTYL) 10 MG capsule TAKE 1 CAPSULE BY MOUTH 4 TIMES  DAILY AS NEEDED 240 capsule 0   furosemide (LASIX) 20 MG tablet TAKE 1 TABLET BY MOUTH  DAILY AS NEEDED 90 tablet 3   Lancets (ONETOUCH ULTRASOFT) lancets USE AS DIRECTED 3 TIMES  DAILY 300 each 3   losartan (COZAAR) 50 MG tablet TAKE 1 TABLET BY MOUTH TWICE  DAILY 180 tablet 3   metFORMIN (GLUCOPHAGE-XR) 500 MG 24 hr tablet TAKE 1 TABLET BY MOUTH IN  THE MORNING AND AT BEDTIME 180 tablet 3   metoCLOPramide (REGLAN) 5 MG tablet TAKE 1 TABLET BY MOUTH TWICE  DAILY AS NEEDED 180 tablet 0   metoprolol tartrate (LOPRESSOR) 50 MG tablet Take 50 mg twice a day 180 tablet 3   NEXIUM 40 MG capsule Take 1 capsule (40 mg total) by mouth daily before breakfast. 90 capsule 3   ondansetron (ZOFRAN) 4 MG tablet Take 4 mg by mouth every 4 (four) hours as needed. For nausea     ONETOUCH ULTRA test strip CHECK BLOOD SUGAR 3 TIMES  DAILY OR AS NEEDED 300 strip 3   potassium chloride (KLOR-CON M) 10 MEQ tablet TAKE 1 TABLET BY MOUTH  DAILY 90 tablet 3   rosuvastatin (CRESTOR) 20 MG tablet Take 1 tablet (20 mg total) by mouth daily. 90 tablet 3   thiamine (VITAMIN B-1) 100 MG tablet Take 100 mg by  mouth every other day.     No current facility-administered medications for this visit.    Allergies:   Tramadol    ROS:  Please see the history of present illness.   Otherwise, review of systems are positive for none.   All other systems are reviewed and negative.    PHYSICAL EXAM: VS:  BP (!) 144/68 (BP Location: Left Arm, Patient Position: Sitting, Cuff Size: Normal)   Pulse 67  Ht _0  (1.549 m)   Wt 169 lb 12.8 oz (77 kg)   LMP 02/07/1992   SpO2 99%   BMI 32.08 kg/m  , BMI Body mass index is 32.08 kg/m. GENERAL:  Well appearing NECK:  No jugular venous distention, waveform within normal limits, carotid upstroke brisk and symmetric, no bruits, no thyromegaly LUNGS:  Clear to auscultation bilaterally CHEST:  Unremarkable HEART:  PMI not displaced or sustained,S1 and S2 within normal limits, no S3, no S4, no clicks, no rubs, no murmurs ABD:  Flat, positive bowel sounds normal in frequency in pitch, no bruits, no rebound, no guarding, no midline pulsatile mass, no hepatomegaly, no splenomegaly EXT:  2 plus pulses throughout, no edema, no cyanosis no clubbing   EKG:  EKG is   ordered today. Sinus rhythm, rate 67, axis within normal limits, intervals within normal limits, no acute ST-T wave changes.   Recent Labs: 10/11/2021: ALT 18; BUN 21; Creatinine, Ser 0.85; Potassium 4.4; Sodium 137; TSH 2.52 11/04/2021: Hemoglobin 12.7; Platelet Count 229    Lipid Panel    Component Value Date/Time   CHOL 185 10/11/2021 0928   TRIG 122.0 10/11/2021 0928   TRIG 94 11/16/2005 0905   HDL 66.00 10/11/2021 0928   CHOLHDL 3 10/11/2021 0928   VLDL 24.4 10/11/2021 0928   LDLCALC 95 10/11/2021 0928   LDLCALC 116 (H) 11/13/2019 0906   LDLDIRECT 95.0 03/25/2020 1149      Wt Readings from Last 3 Encounters:  12/15/21 169 lb 12.8 oz (77 kg)  11/04/21 168 lb 1.9 oz (76.3 kg)  10/13/21 169 lb 3.2 oz (76.7 kg)      Other studies Reviewed: Additional studies/ records that were  reviewed today include: Neurology notes. Review of the above records demonstrates:  Please see elsewhere in the note.     ASSESSMENT AND PLAN:    SVT:    She has had no symptomatic recurrence of this.  No change in therapy.   Hypertension - Her blood pressure is running low and I am going to reduce her metoprolol to 50 mg twice daily.  If it still runs low probably back off on her ARB.  Dyslipidemia -  Her LDL is 95 and HDL 66.  No change in therapy.   Aortic stenosis- No further testing.  Aortic atherosclerosis - We will continue with risk reduction.  Her ratio is reasonable.  She wants to take her medicine so I will not titrate.  Current medicines are reviewed at length with the patient today.  The patient does not have concerns regarding medicines.  The following changes have been made:     Labs/ tests ordered today include:    Orders Placed This Encounter  Procedures   EKG 12-Lead      Disposition:   FU with me 12 months.     Signed, Minus Breeding, MD  12/15/2021 3:43 PM    [place]

## 2021-12-15 ENCOUNTER — Encounter: Payer: Self-pay | Admitting: Cardiology

## 2021-12-15 ENCOUNTER — Ambulatory Visit: Payer: Medicare Other | Attending: Cardiology | Admitting: Cardiology

## 2021-12-15 VITALS — BP 144/68 | HR 67 | Ht 61.0 in | Wt 169.8 lb

## 2021-12-15 DIAGNOSIS — I10 Essential (primary) hypertension: Secondary | ICD-10-CM

## 2021-12-15 DIAGNOSIS — E785 Hyperlipidemia, unspecified: Secondary | ICD-10-CM | POA: Diagnosis not present

## 2021-12-15 DIAGNOSIS — I700 Atherosclerosis of aorta: Secondary | ICD-10-CM

## 2021-12-15 DIAGNOSIS — I350 Nonrheumatic aortic (valve) stenosis: Secondary | ICD-10-CM | POA: Diagnosis not present

## 2021-12-15 DIAGNOSIS — I471 Supraventricular tachycardia, unspecified: Secondary | ICD-10-CM

## 2021-12-15 NOTE — Patient Instructions (Signed)
Medication Instructions:  Decrease Metoprolol to 50 mg 1 tablet twice a day Continue all other medications *If you need a refill on your cardiac medications before your next appointment, please call your pharmacy*   Lab Work: None ordered  Testing/Procedures: None ordered   Follow-Up: At [facility], you and your health needs are our priority.  As part of our continuing mission to provide you with exceptional heart care, we have created designated Provider Care Teams.  These Care Teams include your primary Cardiologist (physician) and Advanced Practice Providers (APPs -  Physician Assistants and Nurse Practitioners) who all work together to provide you with the care you need, when you need it.  We recommend signing up for the patient portal called "MyChart".  Sign up information is provided on this After Visit Summary.  MyChart is used to connect with patients for Virtual Visits (Telemedicine).  Patients are able to view lab/test results, encounter notes, upcoming appointments, etc.  Non-urgent messages can be sent to your provider as well.   To learn more about what you can do with MyChart, go to [email or web address].    Your next appointment:  6 months     Call in Feb to schedule May appointment     The format for your next appointment: Office   Provider:  Dr.Hochrein   Important Information About Sugar

## 2021-12-22 ENCOUNTER — Other Ambulatory Visit: Payer: Self-pay | Admitting: Family Medicine

## 2022-01-13 ENCOUNTER — Telehealth: Payer: Self-pay | Admitting: Pharmacist

## 2022-01-13 NOTE — Telephone Encounter (Signed)
Patient had questions about ordering free at home COVID tests. Assisted in helping her order the [place] [employer] service.

## 2022-01-17 ENCOUNTER — Telehealth: Payer: Self-pay | Admitting: Internal Medicine

## 2022-01-17 NOTE — Telephone Encounter (Signed)
Inbound call from patient stating that she has been  having abd pain for the last 10 days now and needed to get an appointment. Patient was scheduled for 12/14 at 11:10 with Dr. Carlean Purl, but is requesting a call back to discuss pain issues. Please advise.

## 2022-01-17 NOTE — Telephone Encounter (Signed)
Pt was contacted and made aware that Dr. Carlean Purl will do as assessment on Thursday (in 2 days) and he will make recommendations off of that assessment: Pt verbalized understanding with all questions answered.

## 2022-01-19 ENCOUNTER — Other Ambulatory Visit (INDEPENDENT_AMBULATORY_CARE_PROVIDER_SITE_OTHER): Payer: Medicare Other

## 2022-01-19 ENCOUNTER — Ambulatory Visit: Payer: Medicare Other | Admitting: Internal Medicine

## 2022-01-19 ENCOUNTER — Encounter: Payer: Self-pay | Admitting: Internal Medicine

## 2022-01-19 VITALS — BP 132/74 | HR 78 | Ht 61.0 in | Wt 169.0 lb

## 2022-01-19 DIAGNOSIS — R10814 Left lower quadrant abdominal tenderness: Secondary | ICD-10-CM | POA: Diagnosis not present

## 2022-01-19 DIAGNOSIS — R1032 Left lower quadrant pain: Secondary | ICD-10-CM | POA: Diagnosis not present

## 2022-01-19 DIAGNOSIS — Z8719 Personal history of other diseases of the digestive system: Secondary | ICD-10-CM | POA: Diagnosis not present

## 2022-01-19 LAB — BUN: BUN: 19 mg/dL (ref 6–23)

## 2022-01-19 LAB — CREATININE, SERUM: Creatinine, Ser: 0.9 mg/dL (ref 0.40–1.20)

## 2022-01-19 NOTE — Patient Instructions (Signed)
You will be contacted by [facility] in the next 2 days to arrange a CT scan.  The number on your caller ID will be [phone], please answer when they call.  If you have not heard from them in 2 days please call [phone] to schedule.     Your provider has requested that you go to the basement level for lab work before leaving today. Press "B" on the elevator. The lab is located at the first door on the left as you exit the elevator.   Use a heating pad for the pain and you may take 2 regular strength Tylenol as needed.  Stop your dicyclomine.   I appreciate the opportunity to care for you. Silvano Rusk, MD

## 2022-01-19 NOTE — Progress Notes (Signed)
 Clevie N Schaber 81 y.o. 10/12/1940 [id number]  Assessment & Plan:   Encounter Diagnoses  Name Primary?   Left lower quadrant abdominal tenderness without rebound tenderness Yes   LLQ pain    History of diverticulitis    Left lower quadrant abdominal pain     This could be diverticulitis and it might be resolving on its own without antibiotics.  Last year she had pain and was seen after treatment with antibiotics and was improved.  Her last CT scan was in 2021 did not show diverticulitis though she has severe sigmoid diverticulosis.  It could be symptomatic diverticulosis.  She has back pain and recently went through physical therapy for C-spine and low back pain.  She has not done the exercises prescribed by the physical therapist (therapy complete) because she was afraid it would make her hurt.   I am discontinuing dicyclomine because of dizziness.  She will try acetaminophen 2 regular strength as needed.  She says this makes her sleepy so she will take that in the proper context to minimize the risk of falls there as well.  Heating pad as needed is an option.  If she does have signs of diverticulitis we will treat with antibiotics.  It can be challenging to sort out the source of her pain she has neuropathic issue she has spine problems with degenerative disc disease and there may be some sort of overlap here.  In my experience burning pain is often neuropathic.  Further plans pending the CT as above. Subjective:   Chief Complaint: Left lower quadrant pain  HPI 81-year-old Palestinian woman with a history of hyperplastic and fundic gland polyps, gastroparesis, diverticulitis, IBS, and a remote history of a benign carcinoid tumor of the stomach who presents with complaints of left lower quadrant pain that radiates down into the groin and in the back.  She actually also has chronic abdominal pain issues.  She was seen in December 2022 with lower abdominal pain on the right side  though classically and chronically she has left lower quadrant pain.  She is having problems for several weeks now and says that it is new burning pain as well as her old type of pain.  It may be worse with movement.  Initially it was worse when she was walking but it is better at this time.  However still persists.  Sometimes when she eats it is worse and if she has loose stools it burns more.  She is having regular defecation overall.  She did report dizziness with dicyclomine which is a new thing, she has taken that medicine for some time.  She was unsteady on her feet she did not fall.  She has not seen any blood in the stool. Last colonoscopy 2012, severe diverticulosis throughout.  Not repeated due to age and lack of polyps.  She is asking about the utility of a JONA test which is a microbiome assessment of the stool.  She reports that she did not follow through with do her home exercise plan as prescribed by PT (last session early November) because she was afraid it would make her hurt. Wt Readings from Last 3 Encounters:  01/19/22 169 lb (76.7 kg)  12/15/21 169 lb 12.8 oz (77 kg)  11/04/21 168 lb 1.9 oz (76.3 kg)   Her 89-year-old brother died earlier this year of kidney failure. Allergies  Allergen Reactions   Tramadol Other (See Comments)    Dizziness    Current Meds  Medication Sig     acetaminophen (TYLENOL) 325 MG tablet Take 2 tablets (650 mg total) by mouth every 6 (six) hours as needed for up to 30 doses for mild pain or moderate pain.   amLODipine (NORVASC) 5 MG tablet TAKE 1 TABLET BY MOUTH  DAILY   aspirin EC 81 MG tablet Take 81 mg by mouth daily. Swallow whole.   betamethasone valerate ointment (VALISONE) 0.1 % Apply a small amount to affected area topically every other day.   Blood Glucose Monitoring Suppl (ONE TOUCH ULTRA 2) w/Device KIT USE TO CHECK BLOOD SUGAR AS DIRECTED   cholecalciferol (VITAMIN D) 1000 UNITS tablet Take 1,000 Units by mouth daily.   dicyclomine  (BENTYL) 10 MG capsule TAKE 1 CAPSULE BY MOUTH 4 TIMES  DAILY AS NEEDED   furosemide (LASIX) 20 MG tablet TAKE 1 TABLET BY MOUTH  DAILY AS NEEDED   Lancets (ONETOUCH ULTRASOFT) lancets USE AS DIRECTED 3 TIMES  DAILY   losartan (COZAAR) 50 MG tablet TAKE 1 TABLET BY MOUTH TWICE  DAILY   metFORMIN (GLUCOPHAGE-XR) 500 MG 24 hr tablet TAKE 1 TABLET BY MOUTH IN  THE MORNING AND AT BEDTIME   metoCLOPramide (REGLAN) 5 MG tablet TAKE 1 TABLET BY MOUTH TWICE  DAILY AS NEEDED   metoprolol tartrate (LOPRESSOR) 50 MG tablet Take 50 mg twice a day   NEXIUM 40 MG capsule Take 1 capsule (40 mg total) by mouth daily before breakfast.   ondansetron (ZOFRAN) 4 MG tablet Take 4 mg by mouth every 4 (four) hours as needed. For nausea   ONETOUCH ULTRA test strip CHECK BLOOD SUGAR 3 TIMES DAILY  OR AS NEEDED   potassium chloride (KLOR-CON M) 10 MEQ tablet TAKE 1 TABLET BY MOUTH  DAILY   rosuvastatin (CRESTOR) 20 MG tablet Take 1 tablet (20 mg total) by mouth daily.   thiamine (VITAMIN B-1) 100 MG tablet Take 100 mg by mouth every other day.   Past Medical History:  Diagnosis Date   Abdominal pain in female 03/18/2010   Qualifier: Diagnosis of  By: Gessner MD, FACG, Carl E    Anemia 06/08/2014   Anxiety    Arthritis    Spinal Osteoarthritis   Cancer ([facility])    Carcinoid tumor of stomach    Cataract    Chest pain    Myoview 12/15 no ischemia.   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 ([facility]) 09/05/2006   Qualifier: Diagnosis of  By: Brand RMA, Lucy     Diabetic peripheral neuropathy ([facility]) 10/29/2013   Encounter for preventative adult health care exam with abnormal findings 09/14/2013   Esophageal reflux    Gastric polyp    Fundic Gland   Gastroparesis    Headache(784.0)    Heart murmur    Echocardiogram 2/11: EF 60-65%, mild LAE, grade 1 diastolic dysfunction, aortic valve sclerosis, mean gradient 9 mm of mercury, PASP 34    Hematuria 03/16/2016   Iron deficiency anemia, unspecified    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   Leg swelling    bilateral   Neck pain 04/22/2015   PONV (postoperative nausea and vomiting)    pt states only needs small amount of anesthesia   PSVT (paroxysmal supraventricular tachycardia)    Pure hypercholesterolemia    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   Renal insufficiency 06/18/2019   Stroke ([facility])    tia, 2014   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   Type II or unspecified type diabetes   mellitus without mention of complication, not stated as uncontrolled    Unspecified essential hypertension    Unspecified hereditary and idiopathic peripheral neuropathy 10/29/2013   Past Surgical History:  Procedure Laterality Date   CHOLECYSTECTOMY  1993   COLONOSCOPY  11/11/2010   diverticulosis   DILATATION & CURRETTAGE/HYSTEROSCOPY WITH RESECTOCOPE N/A 02/25/2013   Procedure: Attempted hysteroscopy with uterine perforation;  Surgeon: Jamey Reas de Berton Lan, MD;  Location: [facility];  Service: Gynecology;  Laterality: N/A;   ESOPHAGOGASTRODUODENOSCOPY  08/29/2010; 09/15/2010   Carcinoid tumor less than 1 cm in July 2012 not seen in August 2012 , gastritis, fundic gland polyps   ESOPHAGOGASTRODUODENOSCOPY  05/16/2011   ESOPHAGOGASTRODUODENOSCOPY  06/14/2012   EUS  12/15/2010   Procedure: UPPER ENDOSCOPIC ULTRASOUND (EUS) LINEAR;  Surgeon: Owens Loffler, MD;  Location: [facility];  Service: Endoscopy;  Laterality: N/A;   EYE SURGERY Bilateral    Bi lateral cateracts and bi lateral laser   LAPAROSCOPY N/A 02/25/2013   Procedure: Cystoscopy and laparoscopy with fulguration of uterine serosa;  Surgeon: Jamey Reas de Berton Lan, MD;  Location: [facility];  Service: Gynecology;  Laterality: N/A;   TONSILLECTOMY     Social History   Social History Narrative   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   Patient is right-handed.   Patient has a BA degree.   One caffeine drink daily     family history includes Diabetes in her brother, maternal grandmother, mother, sister, and sister; Heart disease in her brother, brother, sister, sister, and sister; Hyperlipidemia in her brother, sister, and sister; Hypertension in her brother, paternal grandmother, sister, and sister; Intellectual disability in her brother; Stroke in her father.   Review of Systems As above  Objective:   Physical Exam BP 132/74   Pulse 78   Ht 5' 1" (1.549 m)   Wt 169 lb (76.7 kg)   LMP 02/07/1992   BMI 31.93 kg/m  NAD Back sl L CVAT Abd soft, mildly render LLQ and groin and some pain with hip flexion and rotation int/ext neg [place] - no mass or hernia  35 minutes total time

## 2022-02-01 ENCOUNTER — Ambulatory Visit (HOSPITAL_BASED_OUTPATIENT_CLINIC_OR_DEPARTMENT_OTHER): Payer: Medicare Other

## 2022-02-13 ENCOUNTER — Telehealth: Payer: Self-pay | Admitting: Family Medicine

## 2022-02-13 ENCOUNTER — Ambulatory Visit (HOSPITAL_BASED_OUTPATIENT_CLINIC_OR_DEPARTMENT_OTHER): Payer: Medicare Other

## 2022-02-13 NOTE — Telephone Encounter (Signed)
Pt called stating that they would like to speak to Tammy. When asked what the purpose of the call was, pt stated that there were some "health problems" she wanted to talk about. Advised pt that Tammy was on call with another pt and would give her a call back later. Pt acknowledged understanding.

## 2022-02-14 NOTE — Telephone Encounter (Signed)
Called patient back. She has question about order for CT scan and holding medications.  Patient had CT ordered for yesterday but she did not feel well so she cancelled. She also was concerned because she was not instructed to hold any meds prior to CT / contrast media. She read on internet that she should hold metformin so she had a question about what to do.   Lab Results  Component Value Date   CREATININE 0.90 01/19/2022   CREATININE 0.85 10/11/2021   CREATININE 0.91 08/05/2021    Encouraged patient to reschedule CT with contrast media.  Recommended that she hold metformin 24 hours prior to and 48 hours after contrast media.  Also to hold furosemide 24 hours prior to CT with contrast media. Can restart the day after test.

## 2022-02-15 NOTE — Telephone Encounter (Signed)
Pt called was advised and  Stated understand

## 2022-02-16 ENCOUNTER — Other Ambulatory Visit: Payer: Self-pay | Admitting: Family Medicine

## 2022-02-16 ENCOUNTER — Telehealth: Payer: Self-pay | Admitting: Internal Medicine

## 2022-02-16 ENCOUNTER — Other Ambulatory Visit: Payer: Self-pay | Admitting: Cardiology

## 2022-02-16 NOTE — Telephone Encounter (Signed)
PT was ordered a CT scan of pelvis with contrast. Has some question about it. Please advise

## 2022-02-16 NOTE — Telephone Encounter (Signed)
Pt stated that she had to cancel her CT scan last week but wanted to call and reschedule. Pt has number to radiology: Pt questioned if Dr. Carlean Purl wanted her to have the oral and IV contrast. Chart reviewed. Pt was notified that yes Dr. Carlean Purl request both.  Pt verbalized understanding with all questions answered.

## 2022-02-18 ENCOUNTER — Ambulatory Visit (HOSPITAL_BASED_OUTPATIENT_CLINIC_OR_DEPARTMENT_OTHER)
Admission: RE | Admit: 2022-02-18 | Discharge: 2022-02-18 | Disposition: A | Discharge status: Home / Self Care | Payer: Medicare Other | Source: Ambulatory Visit | Attending: Internal Medicine | Admitting: Internal Medicine

## 2022-02-18 DIAGNOSIS — R10814 Left lower quadrant abdominal tenderness: Secondary | ICD-10-CM | POA: Insufficient documentation

## 2022-02-18 DIAGNOSIS — I700 Atherosclerosis of aorta: Secondary | ICD-10-CM | POA: Diagnosis not present

## 2022-02-18 DIAGNOSIS — R1032 Left lower quadrant pain: Secondary | ICD-10-CM | POA: Diagnosis not present

## 2022-02-18 DIAGNOSIS — R109 Unspecified abdominal pain: Secondary | ICD-10-CM | POA: Diagnosis not present

## 2022-02-18 LAB — POCT I-STAT CREATININE: Creatinine, Ser: 0.8 mg/dL (ref 0.44–1.00)

## 2022-02-18 MED ORDER — IOHEXOL 300 MG/ML  SOLN
75.0000 mL | Freq: Once | INTRAMUSCULAR | Status: AC | PRN
  Administered 2022-02-18: 75 mL via INTRAVENOUS

## 2022-02-20 NOTE — Telephone Encounter (Signed)
Pt called stating that she did get the CT scan and wanted to let Dr. Charlett Blake know.

## 2022-02-26 ENCOUNTER — Other Ambulatory Visit: Payer: Self-pay | Admitting: Family Medicine

## 2022-03-23 ENCOUNTER — Other Ambulatory Visit (INDEPENDENT_AMBULATORY_CARE_PROVIDER_SITE_OTHER): Payer: Medicare Other

## 2022-03-23 DIAGNOSIS — E1169 Type 2 diabetes mellitus with other specified complication: Secondary | ICD-10-CM | POA: Diagnosis not present

## 2022-03-23 DIAGNOSIS — E7800 Pure hypercholesterolemia, unspecified: Secondary | ICD-10-CM | POA: Diagnosis not present

## 2022-03-23 DIAGNOSIS — E559 Vitamin D deficiency, unspecified: Secondary | ICD-10-CM | POA: Diagnosis not present

## 2022-03-23 DIAGNOSIS — E669 Obesity, unspecified: Secondary | ICD-10-CM | POA: Diagnosis not present

## 2022-03-23 DIAGNOSIS — I10 Essential (primary) hypertension: Secondary | ICD-10-CM

## 2022-03-23 LAB — COMPREHENSIVE METABOLIC PANEL
ALT: 24 U/L (ref 0–35)
AST: 20 U/L (ref 0–37)
Albumin: 4.4 g/dL (ref 3.5–5.2)
Alkaline Phosphatase: 49 U/L (ref 39–117)
BUN: 19 mg/dL (ref 6–23)
CO2: 27 mEq/L (ref 19–32)
Calcium: 9.9 mg/dL (ref 8.4–10.5)
Chloride: 97 mEq/L (ref 96–112)
Creatinine, Ser: 0.83 mg/dL (ref 0.40–1.20)
GFR: 66.32 mL/min (ref >60.00)
Glucose, Bld: 128 mg/dL — ABNORMAL HIGH (ref 70–99)
Potassium: 4.1 mEq/L (ref 3.5–5.1)
Sodium: 135 mEq/L (ref 135–145)
Total Bilirubin: 0.7 mg/dL (ref 0.2–1.2)
Total Protein: 6.9 g/dL (ref 6.0–8.3)

## 2022-03-23 LAB — CBC WITH DIFFERENTIAL/PLATELET
Basophils Absolute: 0.1 10*3/uL (ref 0.0–0.1)
Basophils Relative: 0.9 % (ref 0.0–3.0)
Eosinophils Absolute: 0.1 10*3/uL (ref 0.0–0.7)
Eosinophils Relative: 1.6 % (ref 0.0–5.0)
HCT: 38 % (ref 36.0–46.0)
Hemoglobin: 12.9 g/dL (ref 12.0–15.0)
Lymphocytes Relative: 41.3 % (ref 12.0–46.0)
Lymphs Abs: 2.7 10*3/uL (ref 0.7–4.0)
MCHC: 33.9 g/dL (ref 30.0–36.0)
MCV: 84.8 fl (ref 78.0–100.0)
Monocytes Absolute: 0.5 10*3/uL (ref 0.1–1.0)
Monocytes Relative: 8.1 % (ref 3.0–12.0)
Neutro Abs: 3.2 10*3/uL (ref 1.4–7.7)
Neutrophils Relative %: 48.1 % (ref 43.0–77.0)
Platelets: 239 10*3/uL (ref 150.0–400.0)
RBC: 4.49 Mil/uL (ref 3.87–5.11)
RDW: 13 % (ref 11.5–15.5)
WBC: 6.6 10*3/uL (ref 4.0–10.5)

## 2022-03-23 LAB — TSH: TSH: 1.93 u[IU]/mL (ref 0.35–5.50)

## 2022-03-23 LAB — LIPID PANEL
Cholesterol: 214 mg/dL — ABNORMAL HIGH (ref 0–200)
HDL: 65.7 mg/dL (ref >39.00)
LDL Cholesterol: 119 mg/dL — ABNORMAL HIGH (ref 0–99)
NonHDL: 148.61
Total CHOL/HDL Ratio: 3
Triglycerides: 146 mg/dL (ref 0.0–149.0)
VLDL: 29.2 mg/dL (ref 0.0–40.0)

## 2022-03-23 LAB — VITAMIN D 25 HYDROXY (VIT D DEFICIENCY, FRACTURES): VITD: 39.52 ng/mL (ref 30.00–100.00)

## 2022-03-23 LAB — HEMOGLOBIN A1C: Hgb A1c MFr Bld: 6.5 % (ref 4.6–6.5)

## 2022-03-28 ENCOUNTER — Encounter: Payer: Self-pay | Admitting: Family Medicine

## 2022-03-28 ENCOUNTER — Ambulatory Visit (INDEPENDENT_AMBULATORY_CARE_PROVIDER_SITE_OTHER): Payer: Medicare Other | Admitting: Family Medicine

## 2022-03-28 VITALS — BP 126/68 | HR 68 | Temp 98.0°F | Resp 16 | Ht 61.0 in | Wt 166.6 lb

## 2022-03-28 DIAGNOSIS — R2681 Unsteadiness on feet: Secondary | ICD-10-CM | POA: Diagnosis not present

## 2022-03-28 DIAGNOSIS — E559 Vitamin D deficiency, unspecified: Secondary | ICD-10-CM | POA: Diagnosis not present

## 2022-03-28 DIAGNOSIS — E1169 Type 2 diabetes mellitus with other specified complication: Secondary | ICD-10-CM | POA: Diagnosis not present

## 2022-03-28 DIAGNOSIS — E669 Obesity, unspecified: Secondary | ICD-10-CM

## 2022-03-28 DIAGNOSIS — N289 Disorder of kidney and ureter, unspecified: Secondary | ICD-10-CM | POA: Diagnosis not present

## 2022-03-28 DIAGNOSIS — D538 Other specified nutritional anemias: Secondary | ICD-10-CM

## 2022-03-28 DIAGNOSIS — Z0000 Encounter for general adult medical examination without abnormal findings: Secondary | ICD-10-CM | POA: Diagnosis not present

## 2022-03-28 DIAGNOSIS — I10 Essential (primary) hypertension: Secondary | ICD-10-CM | POA: Diagnosis not present

## 2022-03-28 DIAGNOSIS — E519 Thiamine deficiency, unspecified: Secondary | ICD-10-CM | POA: Diagnosis not present

## 2022-03-28 DIAGNOSIS — E7800 Pure hypercholesterolemia, unspecified: Secondary | ICD-10-CM

## 2022-03-28 DIAGNOSIS — E119 Type 2 diabetes mellitus without complications: Secondary | ICD-10-CM

## 2022-03-28 DIAGNOSIS — D500 Iron deficiency anemia secondary to blood loss (chronic): Secondary | ICD-10-CM

## 2022-03-28 DIAGNOSIS — R531 Weakness: Secondary | ICD-10-CM | POA: Diagnosis not present

## 2022-03-28 DIAGNOSIS — K581 Irritable bowel syndrome with constipation: Secondary | ICD-10-CM | POA: Diagnosis not present

## 2022-03-28 NOTE — Progress Notes (Signed)
Subjective:   By signing my name below, I, Kellie Simmering, attest that this documentation has been prepared under the direction and in the presence of [name], MD., 03/28/2022.   [name], female    DOB: 01-04-1941, 82 y.o.   MRN: :[id number]  Chief Complaint  Patient presents with   Follow-up    Follow up   HPI Patient is in today for a comprehensive physical exam and follow up on chronic medical concerns. She denies CP/palpitations/SOB/HA/congestion/fever/ chills/GI or GU symptoms.  Diabetes Mellitus Type 2 She continues monitoring her blood glucose at home and has been reducing her carbohydrate and sugar intake. Lab Results  Component Value Date   HGBA1C 6.5 03/23/2022   Family History Her brother recently passed away at 41 yo due to cardiac complications, hyperlipidemia, and kidney failure.  Fatigue/Weakness Patient complains of left-sided body fatigue/weakness and reports that last week, the entire left side of her body felt numb/weak consistently for two days. She further reports that her left hand remains noticeably weaker than the right. She displays an unsteady gait during today's visit and has partaken in PT in the past to manage this. She has taken Gabapentin in the past, but this was intolerable, causing her to feel dizzy. 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 She chooses not to receive another MRI at this time.  Supplements Patient does not currently take multivitamins, but does takes calcium supplements, vitamin B1 100 mg, and vitamin D 1000 IU daily.  Past Medical History:  Diagnosis Date   Abdominal pain in female 03/18/2010   Qualifier: Diagnosis of  By: Carlean Purl MD, Dimas Millin    Anemia 06/08/2014   Anxiety    Arthritis    Spinal Osteoarthritis   Cancer ([place])    Carcinoid tumor of  stomach    Cataract    Chest pain    Myoview 12/15 no ischemia.   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 ([place]) 09/05/2006   Qualifier: Diagnosis of  By: Marca Ancona RMA, Lucy     Diabetic peripheral neuropathy ([place]) 10/29/2013   Encounter for preventative adult health care exam with abnormal findings 09/14/2013   Esophageal reflux    Gastric polyp    Fundic Gland   Gastroparesis    Headache(784.0)    Heart murmur    Echocardiogram 2/11: EF 60-65%, mild LAE, grade 1 diastolic dysfunction, aortic valve sclerosis, mean gradient 9 mm of mercury, PASP 34   Hematuria 03/16/2016   Iron deficiency anemia, unspecified    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   Leg swelling    bilateral   Neck pain 04/22/2015   PONV (postoperative nausea and vomiting)    pt states only needs small amount of anesthesia   PSVT (paroxysmal supraventricular tachycardia)    Pure hypercholesterolemia    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   Renal insufficiency 06/18/2019   Stroke ([place])    tia, 2014   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   Type II or unspecified type diabetes mellitus without mention of complication, not stated as uncontrolled    Unspecified essential hypertension    Unspecified hereditary and idiopathic peripheral neuropathy 10/29/2013    Past Surgical History:  Procedure Laterality Date   CHOLECYSTECTOMY  1993   COLONOSCOPY  11/11/2010  diverticulosis   DILATATION & CURRETTAGE/HYSTEROSCOPY WITH RESECTOCOPE N/A 02/25/2013   Procedure: Attempted hysteroscopy with uterine perforation;  Surgeon: Jamey Reas de Berton Lan, MD;  Location: [facility];  Service: Gynecology;  Laterality: N/A;   ESOPHAGOGASTRODUODENOSCOPY  08/29/2010; 09/15/2010   Carcinoid tumor less than 1 cm in July 2012 not seen in August 2012 , gastritis, fundic gland polyps   ESOPHAGOGASTRODUODENOSCOPY  05/16/2011   ESOPHAGOGASTRODUODENOSCOPY  06/14/2012   EUS  12/15/2010   Procedure:  UPPER ENDOSCOPIC ULTRASOUND (EUS) LINEAR;  Surgeon: Owens Loffler, MD;  Location: [facility];  Service: Endoscopy;  Laterality: N/A;   EYE SURGERY Bilateral    Bi lateral cateracts and bi lateral laser   LAPAROSCOPY N/A 02/25/2013   Procedure: Cystoscopy and laparoscopy with fulguration of uterine serosa;  Surgeon: Jamey Reas de Berton Lan, MD;  Location: [facility];  Service: Gynecology;  Laterality: N/A;   TONSILLECTOMY      Family History  Problem Relation Age of Onset   Diabetes Mother    Stroke Father        deceased age 89   Heart disease Sister        deceased MI age 16   Diabetes Sister    Heart disease Sister    Hypertension Sister    Hyperlipidemia Sister    Diabetes Sister    Heart disease Sister    Hypertension Sister    Hyperlipidemia Sister    Heart disease Brother        deceased MI age 72   Intellectual disability Brother    Diabetes Brother    Heart disease Brother    Hypertension Brother    Hyperlipidemia Brother    Diabetes Maternal Grandmother    Hypertension Paternal Grandmother    Colon cancer Neg Hx    Esophageal cancer Neg Hx    Stomach cancer Neg Hx    Rectal cancer Neg Hx     Social History   Socioeconomic History   Marital status: Widowed    Spouse name: Not on file   Number of children: 0   Years of education: college   Highest education level: Not on file  Occupational History   Occupation: retired  Tobacco Use   Smoking status: Never   Smokeless tobacco: Never   Tobacco comments:    Never used tobacco  Vaping Use   Vaping Use: Never used  Substance and Sexual Activity   Alcohol use: No    Alcohol/week: 0.0 standard drinks of alcohol   Drug use: No   Sexual activity: Never    Partners: Male    Birth control/protection: Post-menopausal    Comment: lives alone, no dietary restrictions except avoid fresh veg, fruit, whole grains  Other Topics Concern   Not on file  Social History Narrative   Patient was married  ([name]) - widow   Patient does not have any children.   Patient is right-handed.   Patient has a BA degree.   One caffeine drink daily    Social Determinants of Health   Financial Resource Strain: Low Risk  (03/17/2021)   Overall Financial Resource Strain (CARDIA)    Difficulty of Paying Living Expenses: Not very hard  Food Insecurity: No Food Insecurity (11/09/2020)   Hunger Vital Sign    Worried About Running Out of Food in the Last Year: Never true    Ran Out of Food in the Last Year: Never true  Transportation Needs: No Transportation Needs (11/09/2020)   PRAPARE -  Hydrologist (Medical): No    Lack of Transportation (Non-Medical): No  Physical Activity: Insufficiently Active (03/17/2021)   Exercise Vital Sign    Days of Exercise per Week: 5 days    Minutes of Exercise per Session: 10 min  Stress: No Stress Concern Present (06/07/2021)   [place]    Feeling of Stress : Not at all  Social Connections: Moderately Isolated (06/07/2021)   Social Connection and Isolation Panel [NHANES]    Frequency of Communication with Friends and Family: More than three times a week    Frequency of Social Gatherings with Friends and Family: Once a week    Attends Religious Services: More than 4 times per year    Active Member of [employer] or Organizations: No    Attends Archivist Meetings: Never    Marital Status: Widowed  Intimate Partner Violence: Not At Risk (06/07/2021)   Humiliation, Afraid, Rape, and Kick questionnaire    Fear of Current or Ex-Partner: No    Emotionally Abused: No    Physically Abused: No    Sexually Abused: No    Outpatient Medications Prior to Visit  Medication Sig Dispense Refill   acetaminophen (TYLENOL) 325 MG tablet Take 2 tablets (650 mg total) by mouth every 6 (six) hours as needed for up to 30 doses for mild pain or moderate pain. 30 tablet 0   amLODipine (NORVASC) 5  MG tablet Take 1 tablet (5 mg total) by mouth daily. 100 tablet 1   aspirin EC 81 MG tablet Take 81 mg by mouth daily. Swallow whole.     betamethasone valerate ointment (VALISONE) 0.1 % Apply a small amount to affected area topically every other day. 45 g 1   Blood Glucose Monitoring Suppl (ONE TOUCH ULTRA 2) w/Device KIT USE TO CHECK BLOOD SUGAR AS DIRECTED 1 kit 0   cholecalciferol (VITAMIN D) 1000 UNITS tablet Take 1,000 Units by mouth daily.     furosemide (LASIX) 20 MG tablet TAKE 1 TABLET BY MOUTH DAILY AS  NEEDED 100 tablet 2   Lancets (ONETOUCH ULTRASOFT) lancets USE AS DIRECTED 3 TIMES  DAILY 300 each 3   losartan (COZAAR) 50 MG tablet TAKE 1 TABLET BY MOUTH TWICE  DAILY 200 tablet 2   metFORMIN (GLUCOPHAGE-XR) 500 MG 24 hr tablet TAKE 1 TABLET BY MOUTH IN THE  MORNING AND 1 TABLET AT BEDTIME 200 tablet 2   metoCLOPramide (REGLAN) 5 MG tablet TAKE 1 TABLET BY MOUTH TWICE  DAILY AS NEEDED 180 tablet 0   metoprolol tartrate (LOPRESSOR) 50 MG tablet Take 50 mg twice a day 180 tablet 3   NEXIUM 40 MG capsule Take 1 capsule (40 mg total) by mouth daily before breakfast. 90 capsule 3   ondansetron (ZOFRAN) 4 MG tablet Take 4 mg by mouth every 4 (four) hours as needed. For nausea     ONETOUCH ULTRA test strip CHECK BLOOD SUGAR 3 TIMES DAILY  OR AS NEEDED 300 strip 2   potassium chloride (KLOR-CON M) 10 MEQ tablet TAKE 1 TABLET BY MOUTH DAILY 100 tablet 2   rosuvastatin (CRESTOR) 20 MG tablet Take 1 tablet (20 mg total) by mouth daily. 90 tablet 3   thiamine (VITAMIN B-1) 100 MG tablet Take 100 mg by mouth every other day.     No facility-administered medications prior to visit.    Allergies  Allergen Reactions   Tramadol Other (See Comments)  Dizziness     Review of Systems  Constitutional:  Positive for malaise/fatigue. Negative for chills and fever.  HENT:  Negative for congestion.   Respiratory:  Negative for shortness of breath.   Cardiovascular:  Negative for chest pain and  palpitations.  Gastrointestinal:  Negative for abdominal pain, blood in stool, constipation, diarrhea, nausea and vomiting.  Genitourinary:  Negative for dysuria, frequency, hematuria and urgency.  Skin:           Neurological:  Negative for headaches.       Objective:    Physical Exam Constitutional:      General: She is not in acute distress.    Appearance: Normal appearance. She is normal weight. She is not ill-appearing.  HENT:     Head: Normocephalic and atraumatic.     Right Ear: Tympanic membrane, ear canal and external ear normal.     Left Ear: Tympanic membrane, ear canal and external ear normal.     Nose: Nose normal.     Mouth/Throat:     Mouth: Mucous membranes are moist.     Pharynx: Oropharynx is clear.  Eyes:     General:        Right eye: No discharge.        Left eye: No discharge.     Extraocular Movements: Extraocular movements intact.     Right eye: No nystagmus.     Left eye: No nystagmus.     Pupils: Pupils are equal, round, and reactive to light.  Neck:     Vascular: No carotid bruit.  Cardiovascular:     Rate and Rhythm: Normal rate and regular rhythm.     Pulses: Normal pulses.     Heart sounds: Normal heart sounds. No murmur heard.    No gallop.  Pulmonary:     Effort: Pulmonary effort is normal. No respiratory distress.     Breath sounds: Normal breath sounds. No wheezing or rales.  Abdominal:     General: Bowel sounds are normal.     Palpations: Abdomen is soft.     Tenderness: There is no abdominal tenderness. There is no guarding.  Musculoskeletal:        General: Normal range of motion.     Cervical back: Normal range of motion.     Right lower leg: No edema.     Left lower leg: No edema.     Comments: Muscle strength 5/5 on right upper and lower extremities, but 2/5 on left upper and lower extremities.  Lymphadenopathy:     Cervical: No cervical adenopathy.  Skin:    General: Skin is warm and dry.  Neurological:     Mental  Status: She is alert and oriented to person, place, and time.     Sensory: Sensation is intact.     Motor: Weakness (left-sided body weakness) present.     Coordination: Coordination is intact.     Gait: Gait abnormal.     Deep Tendon Reflexes:     Reflex Scores:      Patellar reflexes are 2+ on the right side and 2+ on the left side. Psychiatric:        Mood and Affect: Mood normal.        Behavior: Behavior normal.        Judgment: Judgment normal.    BP 126/68 (BP Location: Right Arm, Patient Position: Sitting, Cuff Size: Normal)   Pulse 68   Temp 98 F (36.7 C) (Oral)   Resp  16   Ht '5\' 1"'$  (1.549 m)   Wt 166 lb 9.6 oz (75.6 kg)   LMP 02/07/1992   SpO2 98%   BMI 31.48 kg/m  Wt Readings from Last 3 Encounters:  03/28/22 166 lb 9.6 oz (75.6 kg)  01/19/22 169 lb (76.7 kg)  12/15/21 169 lb 12.8 oz (77 kg)    Diabetic Foot Exam - Simple   No data filed    Lab Results  Component Value Date   WBC 6.6 03/23/2022   HGB 12.9 03/23/2022   HCT 38.0 03/23/2022   PLT 239.0 03/23/2022   GLUCOSE 128 (H) 03/23/2022   CHOL 214 (H) 03/23/2022   TRIG 146.0 03/23/2022   HDL 65.70 03/23/2022   LDLDIRECT 95.0 03/25/2020   LDLCALC 119 (H) 03/23/2022   ALT 24 03/23/2022   AST 20 03/23/2022   NA 135 03/23/2022   K 4.1 03/23/2022   CL 97 03/23/2022   CREATININE 0.83 03/23/2022   BUN 19 03/23/2022   CO2 27 03/23/2022   TSH 1.93 03/23/2022   INR 0.88 01/16/2011   HGBA1C 6.5 03/23/2022   MICROALBUR 2.8 (H) 07/05/2021    Lab Results  Component Value Date   TSH 1.93 03/23/2022   Lab Results  Component Value Date   WBC 6.6 03/23/2022   HGB 12.9 03/23/2022   HCT 38.0 03/23/2022   MCV 84.8 03/23/2022   PLT 239.0 03/23/2022   Lab Results  Component Value Date   NA 135 03/23/2022   K 4.1 03/23/2022   CHLORIDE 101 07/08/2015   CO2 27 03/23/2022   GLUCOSE 128 (H) 03/23/2022   BUN 19 03/23/2022   CREATININE 0.83 03/23/2022   BILITOT 0.7 03/23/2022   ALKPHOS 49 03/23/2022    AST 20 03/23/2022   ALT 24 03/23/2022   PROT 6.9 03/23/2022   ALBUMIN 4.4 03/23/2022   CALCIUM 9.9 03/23/2022   ANIONGAP 10 05/04/2021   EGFR 75 (L) 07/08/2015   GFR 66.32 03/23/2022   Lab Results  Component Value Date   CHOL 214 (H) 03/23/2022   Lab Results  Component Value Date   HDL 65.70 03/23/2022   Lab Results  Component Value Date   LDLCALC 119 (H) 03/23/2022   Lab Results  Component Value Date   TRIG 146.0 03/23/2022   Lab Results  Component Value Date   CHOLHDL 3 03/23/2022   Lab Results  Component Value Date   HGBA1C 6.5 03/23/2022      Assessment & Plan:  DEXA: Last completed on 04/26/2020.    The BMD measured at Femur Neck Right is 1.115 g/cm2 with a T-score of 0.6. This patient is considered NORMAL according to [facility]) criteria. Repeat in 2-5 years.  Mammogram: Last completed on 05/17/2021 with no mammographic evidence of malignancy. Repeat in 1-2 years.  Advanced Directives: Encouraged patient to complete advanced care planning documents.  Fatigue/Weakness: Patient will consider an MRI of the brain due to her left-sided body weakness and unsteady gait.   Healthy Lifestyle: Encouraged exercise, heart healthy diet, and hydration.  Hypercholesteremia: Cholesterol is elevated and patient continues taking Rosuvastatin 5 mg.  Hypertension: Well-controlled and patient continues taking Amlodipine 5 mg.  Immunizations: Reviewed patient's immunization history and encouraged RSV/Shingles/Tetanus immunizations. She chooses not to receive these vaccinations at this time.  Supplements: Encouraged 1-3 servings of calcium, multivitamins, vitamin B1, and vitamin D 1000-2000 IU daily.  Unsteady Gait: Referral placed to PT to manage patient's unsteady gait.  Problem List Items Addressed This Visit  Anemia - Primary (Chronic)   Relevant Orders   CBC with Differential/Platelet   Iron, TIBC and Ferritin Panel   Diabetes mellitus type  2 in obese ([facility]) (Chronic)   Relevant Orders   Hemoglobin A1c   Comprehensive metabolic panel   TSH   Essential hypertension (Chronic)    Well controlled, no changes to meds. Encouraged heart healthy diet such as the DASH diet and exercise as tolerated.        HYPERCHOLESTEROLEMIA    Encourage heart healthy diet such as MIND or DASH diet, increase exercise, avoid trans fats, simple carbohydrates and processed foods, consider a krill or fish or flaxseed oil cap daily. Tolerating Rosuvastatin      Relevant Orders   Comprehensive metabolic panel   Lipid panel   TSH   IBS (irritable bowel syndrome)   Iron deficiency anemia   Relevant Orders   CBC with Differential/Platelet   Iron, TIBC and Ferritin Panel   Left-sided weakness    And unsteady gait. She is referred to physical therapy for evaluation and treatment      Relevant Orders   Ambulatory referral to Physical Therapy   Preventative health care    Patient encouraged to maintain heart healthy diet, regular exercise, adequate sleep. Consider daily probiotics. Take medications as prescribed. Labs ordered and reviewed. Given and reviewed copy of ACP documents from [facility] Secretary of State and encouraged to complete and return she has aged out of colonoscopy, MGM and Pap      Renal insufficiency    Hydrate and monitor       Thiamine deficiency    Supplement and monitor       Unsteady gait   Relevant Orders   Ambulatory referral to Physical Therapy   Vitamin D deficiency    Supplement and monitor      Relevant Orders   VITAMIN D 25 Hydroxy (Vit-D Deficiency, Fractures)   No orders of the defined types were placed in this encounter.  I, Penni Homans, MD, personally preformed the services described in this documentation.  All medical record entries made by the scribe were at my direction and in my presence.  I have reviewed the chart and discharge instructions (if applicable) and agree that the record reflects my personal  performance and is accurate and complete. 03/28/2022  I,Mohammed Iqbal,acting as a scribe for Penni Homans, MD.,have documented all relevant documentation on the behalf of Penni Homans, MD,as directed by  Penni Homans, MD while in the presence of Penni Homans, MD.  Penni Homans, MD

## 2022-03-28 NOTE — Patient Instructions (Addendum)
Recommend calcium intake of 1200 to 1500 mg daily, divided into roughly 3 doses. Best source is the diet and a single dairy serving is about 500 mg, a supplement of calcium citrate once or twice daily to balance diet is fine if not getting enough in diet. Also need Vitamin D 2000 IU caps, 1 cap daily if not already taking vitamin D. Also recommend weight baring exercise on hips and upper body to keep bones strong   Take a Vitamin D 1000 IU daily Take a Multivitamin daily such as Centrum silver. Calcium such as Citracal daily  Tetanus at pharmacy Shingrix is the new shingles shot, 2 shots over 2-6 months, confirm coverage with insurance and document, then can return here for shots with nurse appt or at pharmacy   RSV, Respiratory Syncitial Virus Vaccine, Arexvy  Preventive Care 44 Years and Older, Female Preventive care refers to lifestyle choices and visits with your health care provider that can promote health and wellness. Preventive care visits are also called wellness exams. What can I expect for my preventive care visit? Counseling Your health care provider may ask you questions about your: Medical history, including: Past medical problems. Family medical history. Pregnancy and menstrual history. History of falls. Current health, including: Memory and ability to understand (cognition). Emotional well-being. Home life and relationship well-being. Sexual activity and sexual health. Lifestyle, including: Alcohol, nicotine or tobacco, and drug use. Access to firearms. Diet, exercise, and sleep habits. Work and work Statistician. Sunscreen use. Safety issues such as seatbelt and bike helmet use. Physical exam Your health care provider will check your: Height and weight. These may be used to calculate your BMI (body mass index). BMI is a measurement that tells if you are at a healthy weight. Waist circumference. This measures the distance around your waistline. This measurement also  tells if you are at a healthy weight and may help predict your risk of certain diseases, such as type 2 diabetes and high blood pressure. Heart rate and blood pressure. Body temperature. Skin for abnormal spots. What immunizations do I need?  Vaccines are usually given at various ages, according to a schedule. Your health care provider will recommend vaccines for you based on your age, medical history, and lifestyle or other factors, such as travel or where you work. What tests do I need? Screening Your health care provider may recommend screening tests for certain conditions. This may include: Lipid and cholesterol levels. Hepatitis C test. Hepatitis B test. HIV (human immunodeficiency virus) test. STI (sexually transmitted infection) testing, if you are at risk. Lung cancer screening. Colorectal cancer screening. Diabetes screening. This is done by checking your blood sugar (glucose) after you have not eaten for a while (fasting). Mammogram. Talk with your health care provider about how often you should have regular mammograms. BRCA-related cancer screening. This may be done if you have a family history of breast, ovarian, tubal, or peritoneal cancers. Bone density scan. This is done to screen for osteoporosis. Talk with your health care provider about your test results, treatment options, and if necessary, the need for more tests. Follow these instructions at home: Eating and drinking  Eat a diet that includes fresh fruits and vegetables, whole grains, lean protein, and low-fat dairy products. Limit your intake of foods with high amounts of sugar, saturated fats, and salt. Take vitamin and mineral supplements as recommended by your health care provider. Do not drink alcohol if your health care provider tells you not to drink. If you drink  alcohol: Limit how much you have to 0-1 drink a day. Know how much alcohol is in your drink. In the [place] one drink equals one 12 oz bottle of beer  (355 mL), one 5 oz glass of wine (148 mL), or one 1 oz glass of hard liquor (44 mL). Lifestyle Brush your teeth every morning and night with fluoride toothpaste. Floss one time each day. Exercise for at least 30 minutes 5 or more days each week. Do not use any products that contain nicotine or tobacco. These products include cigarettes, chewing tobacco, and vaping devices, such as e-cigarettes. If you need help quitting, ask your health care provider. Do not use drugs. If you are sexually active, practice safe sex. Use a condom or other form of protection in order to prevent STIs. Take aspirin only as told by your health care provider. Make sure that you understand how much to take and what form to take. Work with your health care provider to find out whether it is safe and beneficial for you to take aspirin daily. Ask your health care provider if you need to take a cholesterol-lowering medicine (statin). Find healthy ways to manage stress, such as: Meditation, yoga, or listening to music. Journaling. Talking to a trusted person. Spending time with friends and family. Minimize exposure to UV radiation to reduce your risk of skin cancer. Safety Always wear your seat belt while driving or riding in a vehicle. Do not drive: If you have been drinking alcohol. Do not ride with someone who has been drinking. When you are tired or distracted. While texting. If you have been using any mind-altering substances or drugs. Wear a helmet and other protective equipment during sports activities. If you have firearms in your house, make sure you follow all gun safety procedures. What's next? Visit your health care provider once a year for an annual wellness visit. Ask your health care provider how often you should have your eyes and teeth checked. Stay up to date on all vaccines. This information is not intended to replace advice given to you by your health care provider. Make sure you discuss any  questions you have with your health care provider. Document Revised: 07/21/2020 Document Reviewed: 07/21/2020 Elsevier Patient Education  [place].

## 2022-04-02 DIAGNOSIS — R2681 Unsteadiness on feet: Secondary | ICD-10-CM | POA: Insufficient documentation

## 2022-04-02 DIAGNOSIS — R531 Weakness: Secondary | ICD-10-CM | POA: Insufficient documentation

## 2022-04-02 NOTE — Assessment & Plan Note (Signed)
And unsteady gait. She is referred to physical therapy for evaluation and treatment

## 2022-04-02 NOTE — Assessment & Plan Note (Signed)
Supplement and monitor 

## 2022-04-02 NOTE — Assessment & Plan Note (Signed)
Patient encouraged to maintain heart healthy diet, regular exercise, adequate sleep. Consider daily probiotics. Take medications as prescribed. Labs ordered and reviewed. Given and reviewed copy of ACP documents from [employer] and encouraged to complete and return she has aged out of colonoscopy, MGM and Pap

## 2022-04-02 NOTE — Assessment & Plan Note (Signed)
Well controlled, no changes to meds. Encouraged heart healthy diet such as the DASH diet and exercise as tolerated.  °

## 2022-04-02 NOTE — Assessment & Plan Note (Signed)
Encourage heart healthy diet such as MIND or DASH diet, increase exercise, avoid trans fats, simple carbohydrates and processed foods, consider a krill or fish or flaxseed oil cap daily. Tolerating Rosuvastatin

## 2022-04-02 NOTE — Assessment & Plan Note (Signed)
Hydrate and monitor 

## 2022-04-03 ENCOUNTER — Other Ambulatory Visit (HOSPITAL_BASED_OUTPATIENT_CLINIC_OR_DEPARTMENT_OTHER): Payer: Self-pay | Admitting: Family Medicine

## 2022-04-03 DIAGNOSIS — Z1231 Encounter for screening mammogram for malignant neoplasm of breast: Secondary | ICD-10-CM

## 2022-04-04 ENCOUNTER — Ambulatory Visit: Payer: Medicare Other | Admitting: Pharmacist

## 2022-04-04 DIAGNOSIS — I6529 Occlusion and stenosis of unspecified carotid artery: Secondary | ICD-10-CM

## 2022-04-04 DIAGNOSIS — E7800 Pure hypercholesterolemia, unspecified: Secondary | ICD-10-CM

## 2022-04-04 DIAGNOSIS — I471 Supraventricular tachycardia, unspecified: Secondary | ICD-10-CM

## 2022-04-04 DIAGNOSIS — E1169 Type 2 diabetes mellitus with other specified complication: Secondary | ICD-10-CM

## 2022-04-04 DIAGNOSIS — I10 Essential (primary) hypertension: Secondary | ICD-10-CM

## 2022-04-04 NOTE — Progress Notes (Signed)
Pharmacy Note  04/04/2022 Name: [name] MRN: [id number] DOB: 12-24-1940  Subjective: [name] is a 82 y.o. year old female who is a primary care patient of [name], MD. Clinical Pharmacist Practitioner referral was placed to assist with medication management.    Engaged with patient by telephone for follow up visit today.  Hyperlipidemia: Per Dr Leonie Man LDL gaol < 70. LDL 121 >> 88 >> 95 >>119.  Current therapy: rosuvastatin '20mg'$  daily Patient admits that she has not been taking every day because she was concerned about potential side effects like muscle weakness.   Type 2 DM: Patient's A1c was 6.5% 02/15/20204.  She reports her blood pressure has been higher after meals - ranging from 150 to 220.  FBG usually 120 to 130. A few reading close to 80 but none < 80 in the last 2 months.  She does report some episodes of feeling shaky but not sure if related to blood glucose or blood pressure.   Diet:  Does not eat red meat, only chicken or fish. Limits pasta and rice Breakfast - egg with olive oil with toasted pita bread and yogurt.  Lunch - light lunch like fruit, avocado, salad or nuts Evening meal - vegetables + meat.   Exercise - not much. A little stretching for her neck pain.   Hypertension / paroxysmal SVT: Taking amlodipine '5mg'$  daily, losartan '50mg'$  twice a day and metoprolol '50mg'$  twice a day. Metoprolol dose was lowered to '50mg'$  twice a day at last visit with Dr Percival Spanish 12/15/2021.  Patient does have symptoms of lightheadedness, weakness and numbness.  Home blood pressure - checked 1 or 2 times per day.  Recent blood pressure readings:   Systolic BP Diastolic BP Heart Rate  [id number] 56 58  132 67 62  110 58 60  109 60 59  119 64 58  131 61 59    Objective: Review of patient status, including review of consultants reports, laboratory and other test data, was performed as part of comprehensive evaluation and provision of chronic care management  services.   Lab Results  Component Value Date   CREATININE 0.83 03/23/2022   CREATININE 0.80 02/18/2022   CREATININE 0.90 01/19/2022    Lab Results  Component Value Date   HGBA1C 6.5 03/23/2022       Component Value Date/Time   CHOL 214 (H) 03/23/2022 0931   TRIG 146.0 03/23/2022 0931   TRIG 94 11/16/2005 0905   HDL 65.70 03/23/2022 0931   CHOLHDL 3 03/23/2022 0931   VLDL 29.2 03/23/2022 0931   LDLCALC 119 (H) 03/23/2022 0931   LDLCALC 116 (H) 11/13/2019 0906   LDLDIRECT 95.0 03/25/2020 1149     Clinical ASCVD: Yes  The ASCVD Risk score (Arnett DK, et al., 2019) failed to calculate for the following reasons:   The 2019 ASCVD risk score is only valid for ages 76 to 44    BP Readings from Last 3 Encounters:  03/28/22 126/68  01/19/22 132/74  12/15/21 (!) 144/68     Allergies  Allergen Reactions   Tramadol Other (See Comments)    Dizziness     Medications Reviewed Today     Reviewed by [name], RPH-CPP (Pharmacist) on 04/04/22 at 1346  Med List Status: <None>   Medication Order Taking? Sig Documenting Provider Last Dose Status Informant  acetaminophen (TYLENOL) 325 MG tablet [id number] Yes Take 2 tablets (650 mg total) by mouth every 6 (six) hours as needed for  up to 30 doses for mild pain or moderate pain. Wyvonnia Dusky, MD Taking Active   amLODipine (NORVASC) 5 MG tablet [id number] Yes Take 1 tablet (5 mg total) by mouth daily. Minus Breeding, MD Taking Active   aspirin EC 81 MG tablet [id number] Yes Take 81 mg by mouth daily. Swallow whole. [provider] Taking Active Self  betamethasone valerate ointment (VALISONE) 0.1 % [id number]  Apply a small amount to affected area topically every other day. [name], MD  Active   Blood Glucose Monitoring Suppl (ONE TOUCH ULTRA 2) w/Device Drucie Opitz [id number] Yes USE TO CHECK BLOOD SUGAR AS DIRECTED [name], MD Taking Active   cholecalciferol (VITAMIN D) 1000 UNITS tablet [id number] Yes Take 1,000  Units by mouth daily. [provider] Taking Active Self  furosemide (LASIX) 20 MG tablet [id number] Yes TAKE 1 TABLET BY MOUTH DAILY AS  NEEDED [name], MD Taking Active   Lancets [facility] ULTRASOFT) lancets [id number] Yes USE AS DIRECTED 3 TIMES  DAILY [name], MD Taking Active   losartan (COZAAR) 50 MG tablet [id number] Yes TAKE 1 TABLET BY MOUTH TWICE  DAILY [name], MD Taking Active   metFORMIN (GLUCOPHAGE-XR) 500 MG 24 hr tablet [id number] Yes TAKE 1 TABLET BY MOUTH IN THE  MORNING AND 1 TABLET AT BEDTIME  Patient taking differently: 500 mg 2 (two) times daily with a meal.   [name], MD Taking Active   metoCLOPramide (REGLAN) 5 MG tablet [id number]  TAKE 1 TABLET BY MOUTH TWICE  DAILY AS NEEDED Gatha Mayer, MD  Active   metoprolol tartrate (LOPRESSOR) 50 MG tablet [id number] Yes Take 50 mg twice a day Minus Breeding, MD Taking Active   NEXIUM 40 MG capsule [id number] Yes Take 1 capsule (40 mg total) by mouth daily before breakfast. Gatha Mayer, MD Taking Active            Med Note [facility], [facility]   Fri Jun 03, 2021  1:26 PM) Using over-the-counter   ondansetron (ZOFRAN) 4 MG tablet [id number]  Take 4 mg by mouth every 4 (four) hours as needed. For nausea Renato Shin, MD  Active Self  Donald Siva test strip [id number] Yes CHECK BLOOD SUGAR 3 TIMES DAILY  OR AS NEEDED [name], MD Taking Active   potassium chloride (KLOR-CON M) 10 MEQ tablet [id number] Yes TAKE 1 TABLET BY MOUTH DAILY [name], MD Taking Active   rosuvastatin (CRESTOR) 20 MG tablet [id number] Yes Take 1 tablet (20 mg total) by mouth daily. [name], MD Taking Active   thiamine (VITAMIN B-1) 100 MG tablet [id number] Yes Take 100 mg by mouth every other day. 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   Renal insufficiency 06/18/2019   Educated about COVID-19 virus infection 05/15/2019   Atrophic vaginitis 01/20/2019   Stenosis of carotid artery 11/12/2018   Anxiety 10/13/2018   Sinusitis 10/11/2017   Headache 07/12/2017   Dizzy spells 11/21/2016   Constipation 11/21/2016   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   Aortic atherosclerosis ([place]) 05/23/2016   Hematuria 03/16/2016   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   Pain of upper abdomen 06/06/2015   Neck pain 04/22/2015   Ear pain 02/28/2015   Abnormal urine 11/21/2014   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   Anemia 06/08/2014   RLS (restless legs syndrome) 11/23/2013   Diabetic peripheral neuropathy ([place]) 10/29/2013   Left-sided thoracic back pain 10/06/2013   Encounter for preventative adult health care exam with abnormal findings 09/14/2013   Iron deficiency anemia    Status post laparoscopy 02/25/2013   Hyponatremia 01/09/2013   GERD (gastroesophageal reflux disease) 01/09/2013   Amaurosis fugax of left eye 10/16/2012   Low back pain 06/03/2012   Vitamin D deficiency 03/11/2012   Bilateral hand pain 10/20/2011   Encounter for long-term (current) use of other medications 10/20/2011   IBS (irritable bowel syndrome) 08/14/2011   TIA (transient ischemic attack) 02/10/2011   Abnormal brain CT 01/19/2011   Allergic rhinitis 10/01/2010   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  Patient affirms current coverage meets needs.   Assessment / Plan: Hyperlipidemia: Not at goal Per Dr Leonie Man LDL goal < 70.  Discussed increasing rosuvastatin to '40mg'$  daily - patient declined. Encouraged her to make sure she is taking rosuvastatin '20mg'$  EVERY DAY.  Encouraged her to increase activity as able - walking or other exercise.   Type 2 DM: A1c at goal.  Continue metformin ER '500mg'$  twice a day.  Due to gastroparesis - GLP1 / Darcel Bayley is not a good option for post prandial highs.  Could consider Prandin with largest meal in future.  Continue to check blood glucose 3 times a day.   Continue to follow CHO limiting diet.   Hypertension / paroxysmal SVT: recently had some low blood pressure readings Continue amlodipine '5mg'$  daily, losartan '50mg'$  twice a day and metoprolol '50mg'$  twice a day.  Discussed trial of lower metoporolol dose of '25mg'$  twice a day to see if fatigue / lightheadedness and low DBP / HR improves. Patient to reach out to Dr Jenkins Rouge before making any changes.  Continue to check blood pressure 1 to 2 times per day.   Reviewed all labs from 03/23/2022 and answered patient's questions.   Follow Up:  patient will contact Clinical Pharmacist Practitioner with any additional questions.    [name], PharmD Clinical Pharmacist [place] [phone]

## 2022-05-05 ENCOUNTER — Inpatient Hospital Stay: Payer: Medicare Other | Attending: Hematology & Oncology

## 2022-05-05 ENCOUNTER — Other Ambulatory Visit: Payer: Self-pay

## 2022-05-05 ENCOUNTER — Encounter: Payer: Self-pay | Admitting: Family

## 2022-05-05 ENCOUNTER — Inpatient Hospital Stay: Payer: Medicare Other | Admitting: Family

## 2022-05-05 VITALS — BP 142/46 | HR 62 | Temp 98.9°F | Resp 18 | Wt 167.0 lb

## 2022-05-05 DIAGNOSIS — K909 Intestinal malabsorption, unspecified: Secondary | ICD-10-CM | POA: Diagnosis not present

## 2022-05-05 DIAGNOSIS — D500 Iron deficiency anemia secondary to blood loss (chronic): Secondary | ICD-10-CM

## 2022-05-05 DIAGNOSIS — D508 Other iron deficiency anemias: Secondary | ICD-10-CM | POA: Diagnosis not present

## 2022-05-05 DIAGNOSIS — Z79899 Other long term (current) drug therapy: Secondary | ICD-10-CM | POA: Diagnosis not present

## 2022-05-05 LAB — IRON AND IRON BINDING CAPACITY (CC-WL,HP ONLY)
Iron: 71 ug/dL (ref 28–170)
Saturation Ratios: 19 % (ref 10.4–31.8)
TIBC: 384 ug/dL (ref 250–450)
UIBC: 313 ug/dL

## 2022-05-05 LAB — CBC WITH DIFFERENTIAL (CANCER CENTER ONLY)
Abs Immature Granulocytes: 0.09 10*3/uL — ABNORMAL HIGH (ref 0.00–0.07)
Basophils Absolute: 0.1 10*3/uL (ref 0.0–0.1)
Basophils Relative: 1 %
Eosinophils Absolute: 0.1 10*3/uL (ref 0.0–0.5)
Eosinophils Relative: 1 %
HCT: 38 % (ref 36.0–46.0)
Hemoglobin: 12.7 g/dL (ref 12.0–15.0)
Immature Granulocytes: 1 %
Lymphocytes Relative: 39 %
Lymphs Abs: 3.5 10*3/uL (ref 0.7–4.0)
MCH: 28.3 pg (ref 26.0–34.0)
MCHC: 33.4 g/dL (ref 30.0–36.0)
MCV: 84.8 fL (ref 80.0–100.0)
Monocytes Absolute: 0.8 10*3/uL (ref 0.1–1.0)
Monocytes Relative: 9 %
Neutro Abs: 4.5 10*3/uL (ref 1.7–7.7)
Neutrophils Relative %: 49 %
Platelet Count: 233 10*3/uL (ref 150–400)
RBC: 4.48 MIL/uL (ref 3.87–5.11)
RDW: 12.2 % (ref 11.5–15.5)
WBC Count: 9.1 10*3/uL (ref 4.0–10.5)
nRBC: 0 % (ref 0.0–0.2)

## 2022-05-05 LAB — RETICULOCYTES
Immature Retic Fract: 5.3 % (ref 2.3–15.9)
RBC.: 4.44 MIL/uL (ref 3.87–5.11)
Retic Count, Absolute: 74.6 10*3/uL (ref 19.0–186.0)
Retic Ct Pct: 1.7 % (ref 0.4–3.1)

## 2022-05-05 LAB — FERRITIN: Ferritin: 285 ng/mL (ref 11–307)

## 2022-05-05 NOTE — Progress Notes (Signed)
Hematology and Oncology Follow Up Visit  [name] [id number] 1940-07-19 82 y.o. 05/05/2022   Principle Diagnosis:  Iron deficiency anemia secondary to malabsorption   Current Therapy:        IV iron as indicated   Interim History:  [name] is here today for follow-up. She is fatigued at times. She had some issues with dizziness and states that her cardiologist Dr. Percival Spanish lowered her metoprolol and this seems to have helped.  No blood loss, bruising or petechiae.  No fever, chills, n/v, cough, rash, SOB, chest pain, palpitations, abdominal pain or changes in bowel or bladder habits.  She has IBS and varies between constipation and diarrhea.  No swelling, tenderness, numbness or tingling in her extremities.  No falls or syncope.  Appetite and hydration are good. Weight is stable at 167 lbs.   ECOG Performance Status: 1 - Symptomatic but completely ambulatory  Medications:  Allergies as of 05/05/2022       Reactions   Tramadol Other (See Comments)   Dizziness        Medication List        Accurate as of May 05, 2022  1:47 PM. If you have any questions, ask your nurse or doctor.          acetaminophen 325 MG tablet Commonly known as: Tylenol Take 2 tablets (650 mg total) by mouth every 6 (six) hours as needed for up to 30 doses for mild pain or moderate pain.   amLODipine 5 MG tablet Commonly known as: NORVASC Take 1 tablet (5 mg total) by mouth daily.   aspirin EC 81 MG tablet Take 81 mg by mouth daily. Swallow whole.   betamethasone valerate ointment 0.1 % Commonly known as: VALISONE Apply a small amount to affected area topically every other day.   cholecalciferol 1000 units tablet Commonly known as: VITAMIN D Take 1,000 Units by mouth daily.   furosemide 20 MG tablet Commonly known as: LASIX TAKE 1 TABLET BY MOUTH DAILY AS  NEEDED   losartan 50 MG tablet Commonly known as: COZAAR TAKE 1 TABLET BY MOUTH TWICE  DAILY   metFORMIN  500 MG 24 hr tablet Commonly known as: GLUCOPHAGE-XR TAKE 1 TABLET BY MOUTH IN THE  MORNING AND 1 TABLET AT BEDTIME What changed: See the new instructions.   metoCLOPramide 5 MG tablet Commonly known as: REGLAN TAKE 1 TABLET BY MOUTH TWICE  DAILY AS NEEDED   metoprolol tartrate 50 MG tablet Commonly known as: LOPRESSOR Take 50 mg twice a day   NexIUM 40 MG capsule Generic drug: esomeprazole Take 1 capsule (40 mg total) by mouth daily before breakfast.   ondansetron 4 MG tablet Commonly known as: ZOFRAN Take 4 mg by mouth every 4 (four) hours as needed. For nausea   ONE TOUCH ULTRA 2 w/Device Kit USE TO CHECK BLOOD SUGAR AS DIRECTED   OneTouch Ultra test strip Generic drug: glucose blood CHECK BLOOD SUGAR 3 TIMES DAILY  OR AS NEEDED   onetouch ultrasoft lancets USE AS DIRECTED 3 TIMES  DAILY   potassium chloride 10 MEQ tablet Commonly known as: KLOR-CON M TAKE 1 TABLET BY MOUTH DAILY   rosuvastatin 20 MG tablet Commonly known as: CRESTOR Take 1 tablet (20 mg total) by mouth daily.   thiamine 100 MG tablet Commonly known as: Vitamin B-1 Take 100 mg by mouth every other day.        Allergies:  Allergies  Allergen Reactions   Tramadol Other (See Comments)  Dizziness     Past Medical History, Surgical history, Social history, and Family History were reviewed and updated.  Review of Systems: All other 10 point review of systems is negative.   Physical Exam:  vitals were not taken for this visit.   Wt Readings from Last 3 Encounters:  03/28/22 166 lb 9.6 oz (75.6 kg)  01/19/22 169 lb (76.7 kg)  12/15/21 169 lb 12.8 oz (77 kg)    Ocular: Sclerae unicteric, pupils equal, round and reactive to light Ear-nose-throat: Oropharynx clear, dentition fair Lymphatic: No cervical or supraclavicular adenopathy Lungs no rales or rhonchi, good excursion bilaterally Heart regular rate and rhythm, no murmur appreciated Abd soft, nontender, positive bowel  sounds MSK no focal spinal tenderness, no joint edema Neuro: non-focal, well-oriented, appropriate affect Breasts: Deferred   Lab Results  Component Value Date   WBC 9.1 05/05/2022   HGB 12.7 05/05/2022   HCT 38.0 05/05/2022   MCV 84.8 05/05/2022   PLT 233 05/05/2022   Lab Results  Component Value Date   FERRITIN 274 11/04/2021   IRON 62 11/04/2021   TIBC 361 11/04/2021   UIBC 299 11/04/2021   IRONPCTSAT 17 11/04/2021   Lab Results  Component Value Date   RETICCTPCT 1.7 05/05/2022   RBC 4.44 05/05/2022   RBC 4.48 05/05/2022   RETICCTABS 39.8 01/22/2015   No results found for: "KPAFRELGTCHN", "LAMBDASER", "KAPLAMBRATIO" Lab Results  Component Value Date   IGA 228 06/06/2011   No results found for: "TOTALPROTELP", "ALBUMINELP", "A1GS", "A2GS", "BETS", "BETA2SER", "GAMS", "MSPIKE", "SPEI"   Chemistry      Component Value Date/Time   NA 135 03/23/2022 0931   NA 136 05/08/2016 1305   NA 134 (L) 07/08/2015 1149   K 4.1 03/23/2022 0931   K 4.2 05/08/2016 1305   K 4.4 07/08/2015 1149   CL 97 03/23/2022 0931   CL 102 05/08/2016 1305   CO2 27 03/23/2022 0931   CO2 26 05/08/2016 1305   CO2 25 07/08/2015 1149   BUN 19 03/23/2022 0931   BUN 16 05/08/2016 1305   BUN 15.5 07/08/2015 1149   CREATININE 0.83 03/23/2022 0931   CREATININE 0.91 08/05/2021 1415   CREATININE 0.8 07/08/2015 1149      Component Value Date/Time   CALCIUM 9.9 03/23/2022 0931   CALCIUM 9.4 05/08/2016 1305   CALCIUM 10.0 07/08/2015 1149   ALKPHOS 49 03/23/2022 0931   ALKPHOS 56 05/08/2016 1305   ALKPHOS 62 07/08/2015 1149   AST 20 03/23/2022 0931   AST 19 05/04/2021 1345   AST 24 07/08/2015 1149   ALT 24 03/23/2022 0931   ALT 22 05/04/2021 1345   ALT 29 05/08/2016 1305   ALT 30 07/08/2015 1149   BILITOT 0.7 03/23/2022 0931   BILITOT 0.7 05/04/2021 1345   BILITOT 0.64 07/08/2015 1149       Impression and Plan:  [name] is a very pleasant 82 yo female with history of iron deficiency  anemia secondary to malabsorption. Iron studies are pending.  Follow-up in 8 months.  Valerie Dawson, NP 3/29/20241:47 PM

## 2022-05-10 ENCOUNTER — Other Ambulatory Visit: Payer: Self-pay | Admitting: Cardiology

## 2022-05-15 NOTE — Progress Notes (Unsigned)
[facility] [address] [place], [place] [address] [phone]  Date:  05/17/2022   Name:  [name]   DOB:  1940/06/04   MRN:  [id number]  PCP:  [name], MD    Chief Complaint: Shoulder Pain (X 1 month. Burning pain 8/10. Shoulder and neck. )   History of Present Illness:  [name] is a 82 y.o. very pleasant female patient who presents with the following:  Primary patient of my partner Dr.Blyth-I have not seen this patient myself previously  History of anemia, anxiety, carcinoid and stomach, diabetes with neuropathy, hypertension, gastroparesis, GERD, hyperlipidemia, IBS, sinus tachycardia, TIA  She notes a pain in her upper back which radiates into her arms- more the left arm and shoulder but occasionally in the right She has noticed this for about one month She went to physical therapy for this at some point but does not really remember when or where She tried using some Aspercreme but not especially helpful  She did have an MRI of her cervical spine not quite 2 years ago which did show significant degenerative change and foraminal stenosis IMPRESSION: 1. Severe foraminal stenosis on the left at C3-C4. Moderate foraminal stenosis on the left at C4-C5. Mild-to-moderate bilateral foraminal stenosis at C5-C6 and mild foraminal stenosis on the right at C3-C4 and C4-C5. 2. Mild to moderate canal stenosis at C3-C4 and C4-C5. Mild canal stenosis at C5-C6.   She also has noticed a mass in her right breast for about one month -she has a regular mammogram set up later this month but understands we will need to change this to a diagnostic mammogram and likely ultrasound since she has symptoms  We discussed the possibility of cardiac etiology of her left arm pain.  Patient states she does not have any chest pain.  I offered to have her seen in the ER today but she declines.  We will check an EKG  and troponin Hochrein is her cardiologist  She notes intermittent chest tightness for "18 years," but not different now than before   Lab Results  Component Value Date   HGBA1C 6.5 03/23/2022     Patient Active Problem List   Diagnosis Date Noted   Left-sided weakness 04/02/2022   Unsteady gait 04/02/2022   Oral lesion 03/08/2021   Sun-damaged skin 03/08/2021   Thiamine deficiency 11/01/2020   Trigeminal neuralgia 08/21/2020   Pedal edema 08/21/2020   Chronic left-sided back pain 03/28/2020   Nocturia 03/28/2020   Left-sided headache 03/28/2020   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   Renal insufficiency 06/18/2019   Educated about COVID-19 virus infection 05/15/2019   Atrophic vaginitis 01/20/2019   Stenosis of carotid artery 11/12/2018   Anxiety 10/13/2018   Referred otalgia of left ear 04/23/2018   Chronic throat clearing 04/23/2018   Sinusitis 10/11/2017   Headache 07/12/2017   Dizzy spells 11/21/2016   Constipation 11/21/2016   Nonrheumatic aortic valve stenosis 05/23/2016   Aortic atherosclerosis 05/23/2016   Hematuria 03/16/2016   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   Pain of upper abdomen 06/06/2015   Neck pain 04/22/2015   Ear pain 02/28/2015   Abnormal urine 11/21/2014   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   Anemia 06/08/2014   RLS (restless legs syndrome) 11/23/2013   Diabetic peripheral neuropathy 10/29/2013   Left-sided thoracic back pain 10/06/2013   Encounter for preventative adult health care exam with abnormal findings 09/14/2013  Iron deficiency anemia    Status post laparoscopy 02/25/2013   Hyponatremia 01/09/2013   GERD (gastroesophageal reflux disease) 01/09/2013   Amaurosis fugax of left eye 10/16/2012   Low back pain 06/03/2012   Vitamin D deficiency 03/11/2012   Bilateral hand pain 10/20/2011   Encounter for long-term (current) use of other medications 10/20/2011   IBS (irritable bowel syndrome) 08/14/2011   TIA (transient  ischemic attack) 02/10/2011   Abnormal brain CT 01/19/2011   Allergic rhinitis 10/01/2010   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   Preventative health care 07/15/2010   FUNDIC GLAND POLYPS OF STOMACH 03/18/2010   Abdominal pain in female 03/18/2010   Pain in joint 03/17/2009   SYSTOLIC MURMUR 03/02/2009   Paroxysmal supraventricular tachycardia 01/12/2009   PLANTAR FASCIITIS 06/08/2008   CHEST PAIN 05/18/2008   Gastroparesis 12/18/2007   HYPERCHOLESTEROLEMIA 06/11/2007   Diabetes mellitus type 2 in obese (HCC) 09/05/2006   Essential hypertension 09/05/2006    Past Medical History:  Diagnosis Date   Abdominal pain in female 03/18/2010   Qualifier: Diagnosis of  By: Leone Payor MD, Alfonse Ras E    Anemia 06/08/2014   Anxiety    Arthritis    Spinal Osteoarthritis   Cancer (HCC)    Carcinoid tumor of stomach    Cataract    Chest pain    Myoview 12/15 no ischemia.   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 (HCC) 09/05/2006   Qualifier: Diagnosis of  By: Charlsie Quest RMA, Lucy     Diabetic peripheral neuropathy (HCC) 10/29/2013   Encounter for preventative adult health care exam with abnormal findings 09/14/2013   Esophageal reflux    Gastric polyp    Fundic Gland   Gastroparesis    Headache(784.0)    Heart murmur    Echocardiogram 2/11: EF 60-65%, mild LAE, grade 1 diastolic dysfunction, aortic valve sclerosis, mean gradient 9 mm of mercury, PASP 34   Hematuria 03/16/2016   Iron deficiency anemia, unspecified    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   Leg swelling    bilateral   Neck pain 04/22/2015   PONV (postoperative nausea and vomiting)    pt states only needs small amount of anesthesia   PSVT (paroxysmal supraventricular tachycardia)    Pure hypercholesterolemia    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   Renal insufficiency 06/18/2019   Stroke (HCC)    tia, 2014   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   Type II or  unspecified type diabetes mellitus without mention of complication, not stated as uncontrolled    Unspecified essential hypertension    Unspecified hereditary and idiopathic peripheral neuropathy 10/29/2013    Past Surgical History:  Procedure Laterality Date   CHOLECYSTECTOMY  1993   COLONOSCOPY  11/11/2010   diverticulosis   DILATATION & CURRETTAGE/HYSTEROSCOPY WITH RESECTOCOPE N/A 02/25/2013   Procedure: Attempted hysteroscopy with uterine perforation;  Surgeon: Jacqualin Combes de Gwenevere Ghazi, MD;  Location: [facility];  Service: Gynecology;  Laterality: N/A;   ESOPHAGOGASTRODUODENOSCOPY  08/29/2010; 09/15/2010   Carcinoid tumor less than 1 cm in July 2012 not seen in August 2012 , gastritis, fundic gland polyps   ESOPHAGOGASTRODUODENOSCOPY  05/16/2011   ESOPHAGOGASTRODUODENOSCOPY  06/14/2012   EUS  12/15/2010   Procedure: UPPER ENDOSCOPIC ULTRASOUND (EUS) LINEAR;  Surgeon: Rob Bunting, MD;  Location: [facility];  Service: Endoscopy;  Laterality: N/A;   EYE SURGERY Bilateral    Bi lateral cateracts and  bi lateral laser   LAPAROSCOPY N/A 02/25/2013   Procedure: Cystoscopy and laparoscopy with fulguration of uterine serosa;  Surgeon: Jacqualin Combes de Gwenevere Ghazi, MD;  Location: [facility];  Service: Gynecology;  Laterality: N/A;   TONSILLECTOMY      Social History   Tobacco Use   Smoking status: Never   Smokeless tobacco: Never   Tobacco comments:    Never used tobacco  Vaping Use   Vaping Use: Never used  Substance Use Topics   Alcohol use: No    Alcohol/week: 0.0 standard drinks of alcohol   Drug use: No    Family History  Problem Relation Age of Onset   Diabetes Mother    Stroke Father        deceased age 65   Heart disease Sister        deceased MI age 74   Diabetes Sister    Heart disease Sister    Hypertension Sister    Hyperlipidemia Sister    Diabetes Sister    Heart disease Sister    Hypertension Sister    Hyperlipidemia Sister    Heart disease Brother         deceased MI age 73   Intellectual disability Brother    Diabetes Brother    Heart disease Brother    Hypertension Brother    Hyperlipidemia Brother    Diabetes Maternal Grandmother    Hypertension Paternal Grandmother    Colon cancer Neg Hx    Esophageal cancer Neg Hx    Stomach cancer Neg Hx    Rectal cancer Neg Hx     Allergies  Allergen Reactions   Tramadol Other (See Comments)    Dizziness     Medication list has been reviewed and updated.  Current Outpatient Medications on File Prior to Visit  Medication Sig Dispense Refill   acetaminophen (TYLENOL) 325 MG tablet Take 2 tablets (650 mg total) by mouth every 6 (six) hours as needed for up to 30 doses for mild pain or moderate pain. 30 tablet 0   amLODipine (NORVASC) 5 MG tablet Take 1 tablet (5 mg total) by mouth daily. 100 tablet 1   aspirin EC 81 MG tablet Take 81 mg by mouth daily. Swallow whole.     betamethasone valerate ointment (VALISONE) 0.1 % Apply a small amount to affected area topically every other day. 45 g 1   Blood Glucose Monitoring Suppl (ONE TOUCH ULTRA 2) w/Device KIT USE TO CHECK BLOOD SUGAR AS DIRECTED 1 kit 0   cholecalciferol (VITAMIN D) 1000 UNITS tablet Take 1,000 Units by mouth daily.     furosemide (LASIX) 20 MG tablet TAKE 1 TABLET BY MOUTH DAILY AS  NEEDED 100 tablet 2   Lancets (ONETOUCH ULTRASOFT) lancets USE AS DIRECTED 3 TIMES  DAILY 300 each 3   losartan (COZAAR) 50 MG tablet TAKE 1 TABLET BY MOUTH TWICE  DAILY 200 tablet 2   metFORMIN (GLUCOPHAGE-XR) 500 MG 24 hr tablet TAKE 1 TABLET BY MOUTH IN THE  MORNING AND 1 TABLET AT BEDTIME (Patient taking differently: 500 mg 2 (two) times daily with a meal.) 200 tablet 2   metoCLOPramide (REGLAN) 5 MG tablet TAKE 1 TABLET BY MOUTH TWICE  DAILY AS NEEDED 180 tablet 0   metoprolol tartrate (LOPRESSOR) 50 MG tablet TAKE 1 AND 1/2 TABLETS BY MOUTH  TWICE DAILY 30 tablet 1   NEXIUM 40 MG capsule Take 1 capsule (40 mg total) by mouth daily before  breakfast.  90 capsule 3   ondansetron (ZOFRAN) 4 MG tablet Take 4 mg by mouth every 4 (four) hours as needed. For nausea     ONETOUCH ULTRA test strip CHECK BLOOD SUGAR 3 TIMES DAILY  OR AS NEEDED 300 strip 2   potassium chloride (KLOR-CON M) 10 MEQ tablet TAKE 1 TABLET BY MOUTH DAILY 100 tablet 2   rosuvastatin (CRESTOR) 20 MG tablet Take 1 tablet (20 mg total) by mouth daily. 90 tablet 3   thiamine (VITAMIN B-1) 100 MG tablet Take 100 mg by mouth every other day.     No current facility-administered medications on file prior to visit.    Review of Systems:  As per HPI- otherwise negative.   Physical Examination: Vitals:   05/17/22 1444  BP: 132/66  Pulse: 97  Resp: 18  Temp: 97.9 F (36.6 C)  SpO2: 97%   Vitals:   05/17/22 1444  Weight: 166 lb 12.8 oz (75.7 kg)  Height: 5\' 1"  (1.549 m)   Body mass index is 31.52 kg/m. Ideal Body Weight: Weight in (lb) to have BMI = 25: 132  GEN: no acute distress.  Mildly obese, looks well HEENT: Atraumatic, Normocephalic.  Ears and Nose: No external deformity. CV: RRR, No M/G/R. No JVD. No thrill. No extra heart sounds. PULM: CTA B, no wheezes, crackles, rhonchi. No retractions. No resp. distress. No accessory muscle use. ABD: S, NT, ND. No rebound. No HSM. I am able to reproduce tenderness by pressing on her trapezius muscles, more so on the left.  Normal range of motion and strength of both upper extremities.  Normal range of motion of her cervical spine EXTR: No c/c/e PSYCH: Normally interactive. Conversant.  There is a palpable mass in the right breast at about 7:00.  This corresponds to the patient's area of concern.  It is approximately 2 to 3 cm in diameter  EKG; sinus rhythm with low voltage in precordial leads, when compared with previous EKG from November no significant changes noted Assessment and Plan: Pain in left arm - Plan: EKG 12-Lead, predniSONE (DELTASONE) 20 MG tablet, Troponin I, CANCELED: DG Cervical Spine  Complete  Mass of lower outer quadrant of right breast - Plan: MM 3D DIAGNOSTIC MAMMOGRAM BILATERAL BREAST, [place] LIMITED ULTRASOUND INCLUDING AXILLA RIGHT BREAST  Chest tightness - Plan: Troponin I (High Sensitivity), Troponin I  Patient seen today with a couple of concerns.  She has pain in her trapezius muscles, more so on the left which sometimes will run down her left arm.  She notes intermittent chest tightness but states it has been present for almost 20 years and has not changed. I have already have her seen in the ER, she declines today.  Will obtain a troponin for further reassurance that her pain is not cardiac in nature.  Discussed her recent cervical MRI.  Advised patient her pain is likely related to degenerative changes in her neck which may be causing pinched nerves.  She is willing to try a short course of prednisone  I also ordered diagnostic mammogram and right breast ultrasound  Signed Abbe Amsterdam, MD  Received her troponin as below, called patient and let her know normal  Results for orders placed or performed in visit on 05/17/22  Troponin I  Result Value Ref Range   Troponin I 3 < OR = 47 ng/L   *Note: Due to a large number of results and/or encounters for the requested time period, some results have not been displayed. A complete set  of results can be found in Results Review.

## 2022-05-17 ENCOUNTER — Ambulatory Visit (INDEPENDENT_AMBULATORY_CARE_PROVIDER_SITE_OTHER): Payer: Medicare Other | Admitting: Family Medicine

## 2022-05-17 VITALS — BP 132/66 | HR 97 | Temp 97.9°F | Resp 18 | Ht 61.0 in | Wt 166.8 lb

## 2022-05-17 DIAGNOSIS — M79602 Pain in left arm: Secondary | ICD-10-CM

## 2022-05-17 DIAGNOSIS — R0789 Other chest pain: Secondary | ICD-10-CM | POA: Diagnosis not present

## 2022-05-17 DIAGNOSIS — N6313 Unspecified lump in the right breast, lower outer quadrant: Secondary | ICD-10-CM

## 2022-05-17 LAB — TROPONIN I: Troponin I: 3 ng/L (ref <=47)

## 2022-05-17 MED ORDER — PREDNISONE 20 MG PO TABS: ORAL_TABLET | ORAL | 0 refills | Status: DC

## 2022-05-17 NOTE — Patient Instructions (Signed)
I will order a special mammogram and ultrasound to be done for the area in your right breast I suspect the pain in your shoulders is coming from your neck.  Let's try a short course of prednisone to see if we can make this better I am going to do a blood test just to make sure your pain does not represent any sort of heart problem

## 2022-05-22 ENCOUNTER — Other Ambulatory Visit: Payer: Self-pay | Admitting: Internal Medicine

## 2022-05-22 ENCOUNTER — Other Ambulatory Visit: Payer: Self-pay | Admitting: Cardiology

## 2022-05-22 ENCOUNTER — Encounter: Payer: Self-pay | Admitting: *Deleted

## 2022-05-23 ENCOUNTER — Inpatient Hospital Stay (HOSPITAL_BASED_OUTPATIENT_CLINIC_OR_DEPARTMENT_OTHER): Admission: RE | Admit: 2022-05-23 | Payer: Medicare Other | Source: Ambulatory Visit

## 2022-05-23 ENCOUNTER — Telehealth: Payer: Self-pay | Admitting: Cardiology

## 2022-05-23 ENCOUNTER — Telehealth (HOSPITAL_BASED_OUTPATIENT_CLINIC_OR_DEPARTMENT_OTHER): Payer: Self-pay

## 2022-05-23 MED ORDER — METFORMIN HCL ER 500 MG PO TB24: 500.0000 mg | ORAL_TABLET | Freq: Two times a day (BID) | ORAL | 2 refills | Status: DC

## 2022-05-23 MED ORDER — METOPROLOL TARTRATE 50 MG PO TABS: 50.0000 mg | ORAL_TABLET | Freq: Two times a day (BID) | ORAL | 3 refills | Status: DC

## 2022-05-23 NOTE — Telephone Encounter (Signed)
Pt c/o medication issue:  1. Name of Medication:   metoprolol tartrate (LOPRESSOR) 50 MG tablet   2. How are you currently taking this medication (dosage and times per day)?   Patient taking 1 tablet twice daily  3. Are you having a reaction (difficulty breathing--STAT)?   No  4. What is your medication issue?  Patient stated her prescription was only written for 30 days and she wants to know if she should continue on this medication.  Patient stated she will need a refill if she should continue with this medication.  Patient wants call back to confirm how she should be taking this medication.

## 2022-05-23 NOTE — Telephone Encounter (Signed)
Spoke with patient ad she states her metoprolol tartrate 50 mg was sent in with only 20 tablets and she would like to know why that changed instead of her 90 day supply. She also states she takes 50mg  bid and that it is working for her and you guys discuss this at her last visit. The Rx that was sent in on 4/4 states to take 1 and 1/2 tablets twice daily. She would like to know is she supposed to be taking 75mg  bid or 50mg  bid? Please advise?

## 2022-05-23 NOTE — Addendum Note (Signed)
Addended by: Freddi Starr on: 05/23/2022 02:19 PM   Modules accepted: Orders

## 2022-05-23 NOTE — Telephone Encounter (Signed)
Spoke with pt, according to the chart the prescription was sent in by NP sarah lachy. Aware she is to take 50 mg of metoprolol twice daily. New script sent to the pharmacy

## 2022-05-25 DIAGNOSIS — H5712 Ocular pain, left eye: Secondary | ICD-10-CM | POA: Diagnosis not present

## 2022-05-25 DIAGNOSIS — H04123 Dry eye syndrome of bilateral lacrimal glands: Secondary | ICD-10-CM | POA: Diagnosis not present

## 2022-05-25 DIAGNOSIS — E119 Type 2 diabetes mellitus without complications: Secondary | ICD-10-CM | POA: Diagnosis not present

## 2022-05-25 DIAGNOSIS — H35033 Hypertensive retinopathy, bilateral: Secondary | ICD-10-CM | POA: Diagnosis not present

## 2022-05-25 LAB — HM DIABETES EYE EXAM

## 2022-05-29 ENCOUNTER — Telehealth: Payer: Self-pay | Admitting: Family Medicine

## 2022-05-29 NOTE — Telephone Encounter (Signed)
Contacted Tegan N Windle to schedule their annual wellness visit. Call back at later date: [facility] per patient.  Did not give date.  Verlee Rossetti; Care Guide Ambulatory Clinical Support [place] [facility] Direct Dial: [phone]

## 2022-06-01 ENCOUNTER — Ambulatory Visit
Admission: RE | Admit: 2022-06-01 | Discharge: 2022-06-01 | Disposition: A | Discharge status: Home / Self Care | Payer: Medicare Other | Source: Ambulatory Visit | Attending: Family Medicine | Admitting: Family Medicine

## 2022-06-01 DIAGNOSIS — N6311 Unspecified lump in the right breast, upper outer quadrant: Secondary | ICD-10-CM | POA: Diagnosis not present

## 2022-06-01 DIAGNOSIS — R92333 Mammographic heterogeneous density, bilateral breasts: Secondary | ICD-10-CM | POA: Diagnosis not present

## 2022-06-01 DIAGNOSIS — N6313 Unspecified lump in the right breast, lower outer quadrant: Secondary | ICD-10-CM

## 2022-06-02 ENCOUNTER — Encounter: Payer: Self-pay | Admitting: Family Medicine

## 2022-06-02 ENCOUNTER — Other Ambulatory Visit: Payer: Self-pay | Admitting: Family Medicine

## 2022-06-02 DIAGNOSIS — N6310 Unspecified lump in the right breast, unspecified quadrant: Secondary | ICD-10-CM

## 2022-06-06 ENCOUNTER — Other Ambulatory Visit: Payer: Medicare Other

## 2022-06-13 ENCOUNTER — Ambulatory Visit
Admission: RE | Admit: 2022-06-13 | Discharge: 2022-06-13 | Disposition: A | Discharge status: Home / Self Care | Payer: Medicare Other | Source: Ambulatory Visit | Attending: Family Medicine | Admitting: Family Medicine

## 2022-06-13 DIAGNOSIS — C50811 Malignant neoplasm of overlapping sites of right female breast: Secondary | ICD-10-CM | POA: Diagnosis not present

## 2022-06-13 DIAGNOSIS — N6310 Unspecified lump in the right breast, unspecified quadrant: Secondary | ICD-10-CM

## 2022-06-13 DIAGNOSIS — N6311 Unspecified lump in the right breast, upper outer quadrant: Secondary | ICD-10-CM | POA: Diagnosis not present

## 2022-06-13 HISTORY — PX: BREAST BIOPSY: SHX20

## 2022-06-14 ENCOUNTER — Telehealth: Payer: Self-pay

## 2022-06-14 NOTE — Telephone Encounter (Signed)
Received call from pt wishing to notify Dr Myna Hidalgo that she's been diagnosed with breast cancer and asking if he could recommend a surgeon. Per Dr Myna Hidalgo, he suggests Dr Abigail Miyamoto at [facility]. Pt verbalizes understanding and appreciation. She is aware that Dr Myna Hidalgo does see and treat breast cancer patients if needed. dph

## 2022-06-15 ENCOUNTER — Telehealth: Payer: Self-pay | Admitting: Family Medicine

## 2022-06-15 ENCOUNTER — Telehealth: Payer: Self-pay | Admitting: Hematology

## 2022-06-15 NOTE — Telephone Encounter (Signed)
Spoke to patient to let [name] know that we would be sending [name] to a surgeon after all so that she can stick with Dr. Myna Hidalgo as [name] Medonc doctor and not have to sit through the long appointment patient voiced understanding.

## 2022-06-15 NOTE — Telephone Encounter (Signed)
Pt called spoke with her regarding results from her Imaging and She was advised waiting on the provider to review results. We will give her a call hopefully tomorrow.

## 2022-06-15 NOTE — Telephone Encounter (Signed)
Patient would like a call back to go over her biopsy results. Please advise.

## 2022-06-15 NOTE — Telephone Encounter (Signed)
Spoke to patient and confirmed afternoon clinic appointment for 5/15, after explaining appointment to patient she seemed a little hesitant to attend appointment I offered her to go straight to a surgeon she liked that idea but also wanted to attend the clinic, I told her since she was a patient of Dr. Myna Hidalgo and he already recommended Dr. Magnus Ivan as a surgeon that I would call his office and see what he thought would be the best course to take and that I would call her back and let her know

## 2022-06-20 ENCOUNTER — Telehealth: Payer: Self-pay | Admitting: *Deleted

## 2022-06-20 NOTE — Telephone Encounter (Signed)
Spoke with patient regarding questions she had about her diagnosis and plan.  She will be meeting with Dr. Magnus Ivan on 5/17 to discuss surgical plan. Discussed details of her pathology report. Patient verbalized understanding.

## 2022-06-21 ENCOUNTER — Encounter: Payer: Self-pay | Admitting: *Deleted

## 2022-06-21 ENCOUNTER — Telehealth: Payer: Self-pay | Admitting: Hematology & Oncology

## 2022-06-21 DIAGNOSIS — I471 Supraventricular tachycardia, unspecified: Secondary | ICD-10-CM | POA: Insufficient documentation

## 2022-06-21 NOTE — Telephone Encounter (Signed)
Received a call from patient concerned about side effects from biopsy, told her I would reach out to nurse and have her call the patient back

## 2022-06-21 NOTE — Progress Notes (Unsigned)
  Cardiology Office Note:   Date:  06/22/2022  ID:  LYNDA HALLOWS, DOB 1940/06/04, MRN [id number]  History of Present Illness:   [name] is a 82 y.o. female who  presents for followup of SVT.  She was seeing neurology and being worked up for some facial numbness not.  Diagnosis of trigeminal neuralgia.  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    She is going to have surgery for her breast cancer.  She is not sure yet what this is clinically and might also include radiation or chemo.  She has had no new cardiovascular complaints.  She does all her household chores although she always move slowly. The patient denies any new symptoms such as chest discomfort, neck or arm discomfort. There has been no new shortness of breath, PND or orthopnea. There have been no reported palpitations, presyncope or syncope.  She has not had the symptoms that was her previous SVT.  She has various discomforts but nothing is new since her stress test a few years ago.   ROS: As stated in the HPI and negative for all other systems.  Studies Reviewed:    EKG:  NSR, rate 70, axis within normal limits, intervals within normal limits, premature atrial contractions, no acute ST-T wave changes.   Risk Assessment/Calculations:          Physical Exam:   VS:  BP 138/68   Pulse 74   Ht 5\' 1"  (1.549 m)   Wt 166 lb (75.3 kg)   LMP 02/07/1992   BMI 31.37 kg/m    Wt Readings from Last 3 Encounters:  06/22/22 166 lb (75.3 kg)  05/17/22 166 lb 12.8 oz (75.7 kg)  05/05/22 167 lb (75.8 kg)     GEN: Well nourished, well developed in no acute distress NECK: No JVD; No carotid bruits CARDIAC: RRR, soft apical systolic murmur, no diastolic  murmurs, rubs, gallops RESPIRATORY:  Clear to auscultation without rales, wheezing or rhonchi  ABDOMEN: Soft, non-tender, non-distended EXTREMITIES:  No edema; No deformity   ASSESSMENT AND PLAN:   SVT -   She has had no  symptomatic recurrence of this.  No change in therapy.   Hypertension - Her blood pressure is elevated but she says it is always very well-controlled at home and we have actually backed off on medications in the past.  She went to come down today further on medications and I think I talked her out of this.  Continue the meds as listed.  I did repeat the BP and it was 138/80    Aortic stenosis- She has some mild aortic stenosis and I will follow this clinically.   Aortic atherosclerosis - We are pursuing risk reduction.  We will continue with risk reduction.  She has been reluctant to have med titration.  Her HDL is excellent.  I will leave her on the meds as listed.  Preop -  Patient has no high risk findings.  She has a reasonable functional level.  She is not going for a procedure.  Based on ACC/AHA guidelines the patient is at acceptable risk for the planned surgery without further testing.        Signed, Rollene Rotunda, MD

## 2022-06-21 NOTE — Progress Notes (Signed)
Patient is an established patient with a new breast cancer diagnosis. Reviewed core biopsy with Dr Myna Hidalgo. He would like to see her after her surgical intervention. She is scheduled to see [facility] on 06/23/2022.  Patient called today with multiple questions about her biopsy site. She states it's burning and blue. She doesn't feel like she had a good handle on the aftercare and expectations from [facility].   Called the [facility] and asked that their nurse call patient and help provide aftercare instructions and discuss the normal healing, expectations of the site.   Oncology Nurse Navigator Documentation     06/21/2022    2:15 PM  Oncology Nurse Navigator Flowsheets  Abnormal Finding Date 06/01/2022  Confirmed Diagnosis Date 06/13/2022  Navigator Follow Up Date: 06/23/2022  Navigator Follow Up Reason: Review Note  Navigator Location [facility]  Navigator Encounter Type Telephone  Telephone Symptom Mgt;Incoming Call  Patient Visit Type MedOnc  Treatment Phase Pre-Tx/Tx Discussion  Barriers/Navigation Needs Coordination of Care  Interventions Coordination of Care  Acuity Level 2-Minimal Needs (1-2 Barriers Identified)  Coordination of Care Other  Time Spent with Patient 15

## 2022-06-22 ENCOUNTER — Encounter: Payer: Self-pay | Admitting: Cardiology

## 2022-06-22 ENCOUNTER — Ambulatory Visit: Payer: Medicare Other | Attending: Cardiology | Admitting: Cardiology

## 2022-06-22 VITALS — BP 138/68 | HR 74 | Ht 61.0 in | Wt 166.0 lb

## 2022-06-22 DIAGNOSIS — I700 Atherosclerosis of aorta: Secondary | ICD-10-CM

## 2022-06-22 DIAGNOSIS — E785 Hyperlipidemia, unspecified: Secondary | ICD-10-CM | POA: Diagnosis not present

## 2022-06-22 DIAGNOSIS — I471 Supraventricular tachycardia, unspecified: Secondary | ICD-10-CM | POA: Diagnosis not present

## 2022-06-22 DIAGNOSIS — I350 Nonrheumatic aortic (valve) stenosis: Secondary | ICD-10-CM

## 2022-06-22 NOTE — Patient Instructions (Signed)
Medication Instructions:  Your physician recommends that you continue on your current medications as directed. Please refer to the Current Medication list given to you today.  *If you need a refill on your cardiac medications before your next appointment, please call your pharmacy*  Follow-Up: At [facility], you and your health needs are our priority.  As part of our continuing mission to provide you with exceptional heart care, we have created designated Provider Care Teams.  These Care Teams include your primary Cardiologist (physician) and Advanced Practice Providers (APPs -  Physician Assistants and Nurse Practitioners) who all work together to provide you with the care you need, when you need it.  We recommend signing up for the patient portal called "MyChart".  Sign up information is provided on this After Visit Summary.  MyChart is used to connect with patients for Virtual Visits (Telemedicine).  Patients are able to view lab/test results, encounter notes, upcoming appointments, etc.  Non-urgent messages can be sent to your provider as well.   To learn more about what you can do with MyChart, go to [email or web address].    Your next appointment:   12 month(s)  Provider:   James Hochrein, MD     

## 2022-06-23 ENCOUNTER — Encounter: Payer: Self-pay | Admitting: Family

## 2022-06-23 ENCOUNTER — Other Ambulatory Visit: Payer: Self-pay | Admitting: Surgery

## 2022-06-23 ENCOUNTER — Encounter: Payer: Self-pay | Admitting: *Deleted

## 2022-06-23 DIAGNOSIS — C50911 Malignant neoplasm of unspecified site of right female breast: Secondary | ICD-10-CM | POA: Diagnosis not present

## 2022-06-23 NOTE — Progress Notes (Signed)
Reviewed note from Dr Eliberto Ivory visit this morning. Patient expressed interest in proceeding with lumpectomy. She will also be referred to radiation oncology. Once surgical date is confirmed, we will schedule follow up with this office.   Oncology Nurse Navigator Documentation     06/23/2022   10:45 AM  Oncology Nurse Navigator Flowsheets  Planned Course of Treatment Surgery  Phase of Treatment Surgery  Navigator Follow Up Date: 07/31/2022  Navigator Follow Up Reason: Surgery  Navigator Location [facility]  Navigator Encounter Type Appt/Treatment Plan Review  Patient Visit Type MedOnc  Treatment Phase Pre-Tx/Tx Discussion  Barriers/Navigation Needs Coordination of Care  Interventions None Required  Acuity Level 2-Minimal Needs (1-2 Barriers Identified)  Time Spent with Patient 15

## 2022-06-26 ENCOUNTER — Other Ambulatory Visit: Payer: Self-pay

## 2022-06-26 ENCOUNTER — Other Ambulatory Visit: Payer: Self-pay | Admitting: *Deleted

## 2022-06-26 ENCOUNTER — Other Ambulatory Visit (INDEPENDENT_AMBULATORY_CARE_PROVIDER_SITE_OTHER): Payer: Medicare Other

## 2022-06-26 ENCOUNTER — Telehealth: Payer: Self-pay | Admitting: *Deleted

## 2022-06-26 ENCOUNTER — Encounter: Payer: Self-pay | Admitting: Family

## 2022-06-26 DIAGNOSIS — E559 Vitamin D deficiency, unspecified: Secondary | ICD-10-CM | POA: Diagnosis not present

## 2022-06-26 DIAGNOSIS — E669 Obesity, unspecified: Secondary | ICD-10-CM

## 2022-06-26 DIAGNOSIS — E1169 Type 2 diabetes mellitus with other specified complication: Secondary | ICD-10-CM

## 2022-06-26 DIAGNOSIS — D500 Iron deficiency anemia secondary to blood loss (chronic): Secondary | ICD-10-CM

## 2022-06-26 DIAGNOSIS — E519 Thiamine deficiency, unspecified: Secondary | ICD-10-CM | POA: Diagnosis not present

## 2022-06-26 DIAGNOSIS — E7800 Pure hypercholesterolemia, unspecified: Secondary | ICD-10-CM

## 2022-06-26 LAB — CBC WITH DIFFERENTIAL/PLATELET
Basophils Absolute: 0.1 10*3/uL (ref 0.0–0.1)
Basophils Relative: 0.6 % (ref 0.0–3.0)
Eosinophils Absolute: 0.1 10*3/uL (ref 0.0–0.7)
Eosinophils Relative: 1.3 % (ref 0.0–5.0)
HCT: 36.5 % (ref 36.0–46.0)
Hemoglobin: 12.4 g/dL (ref 12.0–15.0)
Lymphocytes Relative: 30.4 % (ref 12.0–46.0)
Lymphs Abs: 2.8 10*3/uL (ref 0.7–4.0)
MCHC: 34.1 g/dL (ref 30.0–36.0)
MCV: 85.9 fl (ref 78.0–100.0)
Monocytes Absolute: 0.7 10*3/uL (ref 0.1–1.0)
Monocytes Relative: 7.8 % (ref 3.0–12.0)
Neutro Abs: 5.5 10*3/uL (ref 1.4–7.7)
Neutrophils Relative %: 59.9 % (ref 43.0–77.0)
Platelets: 240 10*3/uL (ref 150.0–400.0)
RBC: 4.25 Mil/uL (ref 3.87–5.11)
RDW: 12.7 % (ref 11.5–15.5)
WBC: 9.1 10*3/uL (ref 4.0–10.5)

## 2022-06-26 LAB — COMPREHENSIVE METABOLIC PANEL
ALT: 23 U/L (ref 0–35)
AST: 21 U/L (ref 0–37)
Albumin: 4.3 g/dL (ref 3.5–5.2)
Alkaline Phosphatase: 60 U/L (ref 39–117)
BUN: 18 mg/dL (ref 6–23)
CO2: 28 mEq/L (ref 19–32)
Calcium: 9.9 mg/dL (ref 8.4–10.5)
Chloride: 98 mEq/L (ref 96–112)
Creatinine, Ser: 0.86 mg/dL (ref 0.40–1.20)
GFR: 63.44 mL/min (ref >60.00)
Glucose, Bld: 136 mg/dL — ABNORMAL HIGH (ref 70–99)
Potassium: 4.5 mEq/L (ref 3.5–5.1)
Sodium: 137 mEq/L (ref 135–145)
Total Bilirubin: 0.8 mg/dL (ref 0.2–1.2)
Total Protein: 6.8 g/dL (ref 6.0–8.3)

## 2022-06-26 LAB — LIPID PANEL
Cholesterol: 173 mg/dL (ref 0–200)
HDL: 64.5 mg/dL (ref >39.00)
LDL Cholesterol: 84 mg/dL (ref 0–99)
NonHDL: 108.24
Total CHOL/HDL Ratio: 3
Triglycerides: 121 mg/dL (ref 0.0–149.0)
VLDL: 24.2 mg/dL (ref 0.0–40.0)

## 2022-06-26 LAB — HEMOGLOBIN A1C: Hgb A1c MFr Bld: 6.7 % — ABNORMAL HIGH (ref 4.6–6.5)

## 2022-06-26 NOTE — Addendum Note (Signed)
Addended by: Mervin Kung A on: 06/26/2022 12:06 PM   Modules accepted: Orders

## 2022-06-26 NOTE — Telephone Encounter (Signed)
B-1 lab order has been placed

## 2022-06-26 NOTE — Telephone Encounter (Signed)
Pt came in for lab appointment only today.  She asked if we were checking B1 today and said she was told we would do it at today's visit?.  I drew an extra tube for the b1 if you do want to add that test.  Please advise?

## 2022-06-26 NOTE — Progress Notes (Signed)
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 Magnus Ivan on 06-23-22 Assessment and Plan:   Diagnoses and all orders for this visit:  Invasive ductal carcinoma of breast, female, right (CMS/HHS-HCC) - Ambulatory Referral to Radiation Oncology - Ambulatory Referral to Oncology-Medical  I had a long discussion with the patient and her friend regarding the diagnosis of breast cancer. I gave them a copy of the pathology report and we went over extensively. From a surgical standpoint we discussed breast conservation with a lumpectomy and possible radiation versus mastectomy. We discussed the long-term outcomes of both. She is interested in breast conservation. I next discussed proceeding with a right breast lumpectomy. I explained the surgical procedure in detail. We discussed the risks which includes but is not limited to bleeding, infection, injury to surrounding structures, the need for further surgery if margins are positive, etc. We discussed the reasons not to biopsy lymph nodes at this point. After long discussion, she understands and agrees to proceed with surgery. She will also be referred to both medical and radiation oncology at the cancer center.   Past/Anticipated interventions by medical oncology, if any: Pt to see Dr. Myna Hidalgo on August 23, 2022 per EMR.   Lymphedema issues, if any:  none at this time  Pain issues, if any: mild discomfort at biopsy site, she does feel like it does not look "normal" yet for her, surgeon looked at was not concerned. Per pt report it is still bruised.   SAFETY ISSUES: Prior radiation? no Pacemaker/ICD? no Possible current pregnancy?no Is the patient on methotrexate?  no  Current Complaints / other details:  Pt has lumpectomy scheduled for 07-31-22 with Dr. Magnus Ivan. She has no major concerns but wants to know the plan overall. Transportation will be a concern for her as she lives alone and does have much support in that area. She reports she has no children and her husband has passed away.

## 2022-06-27 LAB — IRON,TIBC AND FERRITIN PANEL
%SAT: 16 % (calc) (ref 16–45)
Ferritin: 275 ng/mL (ref 16–288)
Iron: 52 ug/dL (ref 45–160)
TIBC: 328 mcg/dL (calc) (ref 250–450)

## 2022-06-27 LAB — TSH: TSH: 1.87 u[IU]/mL (ref 0.35–5.50)

## 2022-06-27 LAB — VITAMIN D 25 HYDROXY (VIT D DEFICIENCY, FRACTURES): VITD: 43.6 ng/mL (ref 30.00–100.00)

## 2022-06-28 DIAGNOSIS — L570 Actinic keratosis: Secondary | ICD-10-CM | POA: Diagnosis not present

## 2022-06-28 DIAGNOSIS — L821 Other seborrheic keratosis: Secondary | ICD-10-CM | POA: Diagnosis not present

## 2022-06-28 DIAGNOSIS — D225 Melanocytic nevi of trunk: Secondary | ICD-10-CM | POA: Diagnosis not present

## 2022-06-28 DIAGNOSIS — L820 Inflamed seborrheic keratosis: Secondary | ICD-10-CM | POA: Diagnosis not present

## 2022-06-28 DIAGNOSIS — Z129 Encounter for screening for malignant neoplasm, site unspecified: Secondary | ICD-10-CM | POA: Diagnosis not present

## 2022-06-30 ENCOUNTER — Encounter: Payer: Self-pay | Admitting: Radiation Oncology

## 2022-06-30 ENCOUNTER — Telehealth: Payer: Self-pay

## 2022-06-30 LAB — VITAMIN B1: Vitamin B1 (Thiamine): 7 nmol/L — ABNORMAL LOW (ref 8–30)

## 2022-06-30 NOTE — Telephone Encounter (Signed)
Rn called pt to obtain meaningful use and nurse evaluation information. Consult note was complete and routed to Dr. Basilio Cairo. Pt overall is doing well with no concerns. She does seem nervous overall about treatment.

## 2022-06-30 NOTE — Progress Notes (Signed)
Pt feels like she will need help with transportation for radiation treatments. She lives alone and has limited support in this area.

## 2022-07-01 ENCOUNTER — Other Ambulatory Visit: Payer: Self-pay | Admitting: Family Medicine

## 2022-07-03 NOTE — Progress Notes (Signed)
Radiation Oncology         [phone]) [phone] ________________________________  Initial Outpatient Consultation  Name: [name] MRN: [id number]  Date: 07/04/2022  DOB: Jan 15, 1941  [id number], Bryon Lions, MD  Si Gaul, MD   REFERRING PHYSICIAN: Si Gaul, MD  DIAGNOSIS:    ICD-10-CM   1. Ductal carcinoma in situ (DCIS) of right breast  D05.11        Cancer Staging  No matching staging information was found for the patient.  Stage *** Right Breast UOQ Invasive ductal carcinoma with intermediate grade DCIS, ER+ / PR+ / Her2-, Grade 1  CHIEF COMPLAINT: Here to discuss management of right breast cancer  HISTORY OF PRESENT ILLNESS::[name] is a 82 y.o. female who presented with a palpable abnormality in the lateral right breast. Subsequent bilateral diagnostic mammogram and right breast ultrasound on 06/01/22 demonstrated an irregular mass with central calcifications in the 9 o'clock right breast, 3 cmfn, measuring 13 x 15 x 15 mm. Normal tissue was appreciated within the subcutaneous right breast (6 o'clock position) at the site of palpable concern. [place] otherwise showed no evidence of right axillary lymphadenopathy.   Biopsy of the 9 o'clock right breast on date of 06/13/22 showed grade 1 invasive mammary/ductal carcinoma measuring 5 mm in the greatest linear extent of the sample with intermediate grade DCIS and rare calcifications.  ER status: 100% positive with strong staining intensity; PR status 85% positive with moderate-strong staining intensity; Proliferation marker Ki67 at 10%; Her2 status negative; Grade 1. No lymph nodes were examined.  The patient was accordingly referred to Dr. Magnus Ivan and she has opted to proceed with breast conserving surgery. Her procedure is currently scheduled for 07/31/22.   Of note: The patient is already established with Dr. Myna Hidalgo in the setting of iron deficiency anemia secondary to malabsorption. She undergoes routine  labs and receives IV iron as indicated. She will maintain care under Dr. Myna Hidalgo pertaining to her recent diagnosis.   ***  PREVIOUS RADIATION THERAPY: No  PAST MEDICAL HISTORY:  has a past medical history of Abdominal pain in female (03/18/2010), Anemia (06/08/2014), Anxiety, Arthritis, Cancer (HCC), Carcinoid tumor of stomach, Cataract, Chest pain, Chronic kidney disease, Constipation (11/21/2016), Diabetes mellitus type 2 in obese (09/05/2006), Diabetic peripheral neuropathy (HCC) (10/29/2013), Encounter for preventative adult health care exam with abnormal findings (09/14/2013), Esophageal reflux, Gastric polyp, Gastroparesis, Headache(784.0), Heart murmur, Hematuria (03/16/2016), Iron deficiency anemia, unspecified, 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    PAST SURGICAL HISTORY: Past Surgical History:  Procedure Laterality Date   BREAST BIOPSY Right 06/13/2022   [place] RT BREAST BX W LOC DEV 1ST LESION IMG BX SPEC US GUIDE 06/13/2022 [facility]   CHOLECYSTECTOMY  1993   COLONOSCOPY  11/11/2010   diverticulosis   DILATATION & CURRETTAGE/HYSTEROSCOPY WITH RESECTOCOPE N/A 02/25/2013   Procedure: Attempted hysteroscopy with uterine perforation;  Surgeon: Jacqualin Combes de Gwenevere Ghazi, MD;  Location: [facility];  Service: Gynecology;  Laterality: N/A;   ESOPHAGOGASTRODUODENOSCOPY  08/29/2010; 09/15/2010   Carcinoid tumor less than 1 cm in July 2012 not seen in August 2012 , gastritis, fundic gland polyps   ESOPHAGOGASTRODUODENOSCOPY  05/16/2011    ESOPHAGOGASTRODUODENOSCOPY  06/14/2012  EUS  12/15/2010   Procedure: UPPER ENDOSCOPIC ULTRASOUND (EUS) LINEAR;  Surgeon: Rob Bunting, MD;  Location: [facility];  Service: Endoscopy;  Laterality: N/A;   EYE SURGERY Bilateral    Bi lateral cateracts and bi lateral laser   LAPAROSCOPY N/A 02/25/2013   Procedure: Cystoscopy and laparoscopy with fulguration of uterine serosa;  Surgeon: Jacqualin Combes de Gwenevere Ghazi, MD;  Location: [facility];  Service: Gynecology;  Laterality: N/A;   TONSILLECTOMY      FAMILY HISTORY: family history includes Diabetes in her brother, maternal grandmother, mother, sister, and sister; Heart disease in her brother, brother, sister, sister, and sister; Hyperlipidemia in her brother, sister, and sister; Hypertension in her brother, paternal grandmother, sister, and sister; Intellectual disability in her brother; Stroke in her father.  SOCIAL HISTORY:  reports that she has never smoked. She has never used smokeless tobacco. She reports that she does not drink alcohol and does not use drugs.  ALLERGIES: Tramadol  MEDICATIONS:  Current Outpatient Medications  Medication Sig Dispense Refill   acetaminophen (TYLENOL) 325 MG tablet Take 2 tablets (650 mg total) by mouth every 6 (six) hours as needed for up to 30 doses for mild pain or moderate pain. 30 tablet 0   amLODipine (NORVASC) 5 MG tablet Take 1 tablet (5 mg total) by mouth daily. 100 tablet 1   aspirin EC 81 MG tablet Take 81 mg by mouth daily. Swallow whole.     betamethasone valerate ointment (VALISONE) 0.1 % Apply a small amount to affected area topically every other day. 45 g 1   Blood Glucose Monitoring Suppl (ONE TOUCH ULTRA 2) w/Device KIT USE TO CHECK BLOOD SUGAR AS DIRECTED 1 kit 0   cholecalciferol (VITAMIN D) 1000 UNITS tablet Take 1,000 Units by mouth daily.     furosemide (LASIX) 20 MG tablet TAKE 1 TABLET BY MOUTH DAILY AS  NEEDED 100 tablet 2   Lancets (ONETOUCH ULTRASOFT) lancets USE AS DIRECTED  3 TIMES  DAILY 300 each 3   losartan (COZAAR) 50 MG tablet TAKE 1 TABLET BY MOUTH TWICE  DAILY 200 tablet 2   metFORMIN (GLUCOPHAGE-XR) 500 MG 24 hr tablet Take 1 tablet (500 mg total) by mouth 2 (two) times daily. TAKE 1 TABLET BY MOUTH IN THE  MORNING AND 1 TABLET AT BEDTIME Strength: 500 mg 180 tablet 2   metoCLOPramide (REGLAN) 5 MG tablet TAKE 1 TABLET BY MOUTH TWICE  DAILY AS NEEDED 180 tablet 0   metoprolol tartrate (LOPRESSOR) 50 MG tablet Take 1 tablet (50 mg total) by mouth 2 (two) times daily. 180 tablet 3   NEXIUM 40 MG capsule Take 1 capsule (40 mg total) by mouth daily before breakfast. 90 capsule 3   ondansetron (ZOFRAN) 4 MG tablet Take 4 mg by mouth every 4 (four) hours as needed. For nausea     ONETOUCH ULTRA test strip CHECK BLOOD SUGAR 3 TIMES DAILY  OR AS NEEDED 300 strip 2   potassium chloride (KLOR-CON M) 10 MEQ tablet TAKE 1 TABLET BY MOUTH DAILY 100 tablet 2   rosuvastatin (CRESTOR) 20 MG tablet Take 1 tablet (20 mg total) by mouth daily. 90 tablet 3   thiamine (VITAMIN B-1) 100 MG tablet Take 100 mg by mouth every other day.     predniSONE (DELTASONE) 20 MG tablet Take 40 mg daily for 3 days, then 20 mg daily for 3 days (Patient not taking: Reported on 06/30/2022) 9 tablet 0   No current facility-administered medications for  this encounter.    REVIEW OF SYSTEMS: As above in HPI.   PHYSICAL EXAM:  vitals were not taken for this visit.   General: Alert and oriented, in no acute distress HEENT: Head is normocephalic. Extraocular movements are intact. Oropharynx is clear. Neck: Neck is supple, no palpable cervical or supraclavicular lymphadenopathy. Heart: Regular in rate and rhythm with no murmurs, rubs, or gallops. Chest: Clear to auscultation bilaterally, with no rhonchi, wheezes, or rales. Abdomen: Soft, nontender, nondistended, with no rigidity or guarding. Extremities: No cyanosis or edema. Lymphatics: see Neck Exam Skin: No concerning lesions. Musculoskeletal:  symmetric strength and muscle tone throughout. Neurologic: Cranial nerves II through XII are grossly intact. No obvious focalities. Speech is fluent. Coordination is intact. Psychiatric: Judgment and insight are intact. Affect is appropriate. Breasts: *** . No other palpable masses appreciated in the breasts or axillae *** .    ECOG = ***  0 - Asymptomatic (Fully active, able to carry on all predisease activities without restriction)  1 - Symptomatic but completely ambulatory (Restricted in physically strenuous activity but ambulatory and able to carry out work of a light or sedentary nature. For example, light housework, office work)  2 - Symptomatic, <50% in bed during the day (Ambulatory and capable of all self care but unable to carry out any work activities. Up and about more than 50% of waking hours)  3 - Symptomatic, >50% in bed, but not bedbound (Capable of only limited self-care, confined to bed or chair 50% or more of waking hours)  4 - Bedbound (Completely disabled. Cannot carry on any self-care. Totally confined to bed or chair)  5 - Death   Santiago Glad MM, Creech RH, Tormey DC, et al. [phone]). "Toxicity and response criteria of the [facility]". Am. Evlyn Clines. Oncol. 5 (6): 649-55   LABORATORY DATA:  Lab Results  Component Value Date   WBC 9.1 06/26/2022   HGB 12.4 06/26/2022   HCT 36.5 06/26/2022   MCV 85.9 06/26/2022   PLT 240.0 06/26/2022   CMP     Component Value Date/Time   NA 137 06/26/2022 0927   NA 136 05/08/2016 1305   NA 134 (L) 07/08/2015 1149   K 4.5 06/26/2022 0927   K 4.2 05/08/2016 1305   K 4.4 07/08/2015 1149   CL 98 06/26/2022 0927   CL 102 05/08/2016 1305   CO2 28 06/26/2022 0927   CO2 26 05/08/2016 1305   CO2 25 07/08/2015 1149   GLUCOSE 136 (H) 06/26/2022 0927   GLUCOSE 143 (H) 05/08/2016 1305   BUN 18 06/26/2022 0927   BUN 16 05/08/2016 1305   BUN 15.5 07/08/2015 1149   CREATININE 0.86 06/26/2022 0927   CREATININE  0.91 08/05/2021 1415   CREATININE 0.8 07/08/2015 1149   CALCIUM 9.9 06/26/2022 0927   CALCIUM 9.4 05/08/2016 1305   CALCIUM 10.0 07/08/2015 1149   PROT 6.8 06/26/2022 0927   PROT 7.2 05/08/2016 1305   PROT 7.4 07/08/2015 1149   ALBUMIN 4.3 06/26/2022 0927   ALBUMIN 3.7 05/08/2016 1305   ALBUMIN 3.9 07/08/2015 1149   AST 21 06/26/2022 0927   AST 19 05/04/2021 1345   AST 24 07/08/2015 1149   ALT 23 06/26/2022 0927   ALT 22 05/04/2021 1345   ALT 29 05/08/2016 1305   ALT 30 07/08/2015 1149   ALKPHOS 60 06/26/2022 0927   ALKPHOS 56 05/08/2016 1305   ALKPHOS 62 07/08/2015 1149   BILITOT 0.8 06/26/2022 0927   BILITOT 0.7  05/04/2021 1345   BILITOT 0.64 07/08/2015 1149   GFRNONAA >60 05/04/2021 1345   GFRNONAA 85 05/04/2014 0829   GFRAA >60 12/26/2017 1000   GFRAA >89 05/04/2014 0829         RADIOGRAPHY: [place] RT BREAST BX W LOC DEV 1ST LESION IMG BX SPEC US GUIDE  Addendum Date: 06/14/2022   ADDENDUM REPORT: 06/14/2022 15:56 ADDENDUM: PATHOLOGY revealed: Site Breast, RIGHT, needle core biopsy, 9:00 3 cm fn - INVASIVE MAMMARY CARCINOMA, SEE NOTE - MAMMARY CARCINOMA IN SITU, INTERMEDIATE GRADE - TUBULE FORMATION: SCORE 3 - NUCLEAR PLEOMORPHISM: SCORE 1 - MITOTIC COUNT: SCORE 1 - TOTAL SCORE: 5 - OVERALL GRADE: 1 - LYMPHOVASCULAR INVASION: NOT IDENTIFIED - CANCER LENGTH: 0.5 CM - CALCIFICATIONS: PRESENT, RARE - OTHER FINDINGS: ADJACENT FIBROADENOMATOID CHANGE WITH CALCIFICATION Pathology results are CONCORDANT with imaging findings, per Dr. Sande Brothers. Pathology results and recommendations below were discussed with patient by telephone on 06/14/2022. Patient reported biopsy site within normal limits with slight tenderness at the site. Post biopsy care instructions were reviewed, questions were answered and my direct phone number was provided to patient. Patient was instructed to call [facility] if any concerns or questions arise related to the biopsy. The patient was  referred to the [facility] at [facility] with appointment on 06/21/2022. Pathology results reported by Lynett Grimes, RN on 06/14/2022. Electronically Signed   By: Sande Brothers M.D.   On: 06/14/2022 15:56   Result Date: 06/14/2022 CLINICAL DATA:  Suspicious right breast mass. EXAM: ULTRASOUND GUIDED RIGHT BREAST CORE NEEDLE BIOPSY COMPARISON:  Previous exam(s). PROCEDURE: I met with the patient and we discussed the procedure of ultrasound-guided biopsy, including benefits and alternatives. We discussed the high likelihood of a successful procedure. We discussed the risks of the procedure, including infection, bleeding, tissue injury, clip migration, and inadequate sampling. Informed written consent was given. The usual time-out protocol was performed immediately prior to the procedure. Lesion quadrant: Upper outer quadrant Using sterile technique and 1% Lidocaine as local anesthetic, under direct ultrasound visualization, a 14 gauge spring-loaded device was used to perform biopsy of a mass at the 9 o'clock position using a lateral approach. At the conclusion of the procedure a coil shaped tissue marker clip was deployed into the biopsy cavity. Follow up 2 view mammogram was performed and dictated separately. IMPRESSION: Ultrasound guided biopsy of the right breast. No apparent complications. Electronically Signed: By: Sande Brothers M.D. On: 06/13/2022 12:17  MM CLIP PLACEMENT RIGHT  Result Date: 06/13/2022 CLINICAL DATA:  Status post right breast biopsy. EXAM: 3D DIAGNOSTIC RIGHT MAMMOGRAM POST ULTRASOUND BIOPSY COMPARISON:  Previous exam(s). FINDINGS: 3D Mammographic images were obtained following ultrasound guided biopsy of the lateral right breast. The biopsy marking clip is in expected position at the site of biopsy. IMPRESSION: Appropriate positioning of the coil shaped biopsy marking clip at the site of biopsy in the lateral right breast. Final  Assessment: Post Procedure Mammograms for Marker Placement Electronically Signed   By: Sande Brothers M.D.   On: 06/13/2022 12:29     IMPRESSION/PLAN: ***   It was a pleasure meeting the patient today. We discussed the risks, benefits, and side effects of radiotherapy. I recommend radiotherapy to the *** to reduce her risk of locoregional recurrence by 2/3.  We discussed that radiation would take approximately *** weeks to complete and that I would give the patient a few weeks to heal following surgery before starting treatment planning. ***  If chemotherapy were to be given, this would precede radiotherapy. We spoke about acute effects including skin irritation and fatigue as well as much less common late effects including internal organ injury or irritation. We spoke about the latest technology that is used to minimize the risk of late effects for patients undergoing radiotherapy to the breast or chest wall. No guarantees of treatment were given. The patient is enthusiastic about proceeding with treatment. I look forward to participating in the patient's care.  I will await her referral back to me for postoperative follow-up and eventual CT simulation/treatment planning.  On date of service, in total, I spent *** minutes on this encounter. Patient was seen in person.   __________________________________________   Lonie Peak, MD  This document serves as a record of services personally performed by Lonie Peak, MD. It was created on her behalf by Neena Rhymes, a trained medical scribe. The creation of this record is based on the scribe's personal observations and the provider's statements to them. This document has been checked and approved by the attending provider.

## 2022-07-04 ENCOUNTER — Ambulatory Visit
Admission: RE | Admit: 2022-07-04 | Discharge: 2022-07-04 | Disposition: A | Discharge status: Home / Self Care | Payer: Medicare Other | Source: Ambulatory Visit | Attending: Radiation Oncology | Admitting: Radiation Oncology

## 2022-07-04 ENCOUNTER — Encounter: Payer: Self-pay | Admitting: Radiation Oncology

## 2022-07-04 VITALS — BP 136/61 | HR 63 | Temp 97.3°F | Resp 18 | Wt 165.8 lb

## 2022-07-04 DIAGNOSIS — C50411 Malignant neoplasm of upper-outer quadrant of right female breast: Secondary | ICD-10-CM | POA: Insufficient documentation

## 2022-07-04 DIAGNOSIS — Z170 Estrogen receptor positive status [ER+]: Secondary | ICD-10-CM | POA: Diagnosis not present

## 2022-07-04 DIAGNOSIS — D0511 Intraductal carcinoma in situ of right breast: Secondary | ICD-10-CM

## 2022-07-04 DIAGNOSIS — Z853 Personal history of malignant neoplasm of breast: Secondary | ICD-10-CM | POA: Insufficient documentation

## 2022-07-05 ENCOUNTER — Encounter: Payer: Self-pay | Admitting: *Deleted

## 2022-07-05 NOTE — Assessment & Plan Note (Signed)
Encourage heart healthy diet such as MIND or DASH diet, increase exercise, avoid trans fats, simple carbohydrates and processed foods, consider a krill or fish or flaxseed oil cap daily. Tolerating Rosuvastatin 

## 2022-07-05 NOTE — Assessment & Plan Note (Signed)
Still having pain and burning in her feet worse when they are more swollen encouraged to minimize sodium, elevate feet, stay as active as able.  

## 2022-07-05 NOTE — Assessment & Plan Note (Signed)
Hydrate and monitor 

## 2022-07-05 NOTE — Progress Notes (Signed)
Patient was seen by RadOnc and notified them that she wanted more information about post op hormonal therapy prior to surgery. Message was received from that team.  Called and spoke with patient. Scheduled for 07/17/22 to discuss hormonal therapy with Dr Myna Hidalgo prior to her surgery at the end of June.   Oncology Nurse Navigator Documentation     07/05/2022    8:45 AM  Oncology Nurse Navigator Flowsheets  Navigator Follow Up Date: 07/31/2022  Navigator Follow Up Reason: Surgery  Navigator Location [facility]  Navigator Encounter Type Telephone  Telephone Appt Confirmation/Clarification;Education;Outgoing Call  Patient Visit Type MedOnc  Treatment Phase Pre-Tx/Tx Discussion  Barriers/Navigation Needs Coordination of Care  Interventions Coordination of Care;Education;Psycho-Social Support  Acuity Level 2-Minimal Needs (1-2 Barriers Identified)  Coordination of Care Appts  Education Method Verbal  Time Spent with Patient 15

## 2022-07-05 NOTE — Assessment & Plan Note (Signed)
Supplement and monitor 

## 2022-07-05 NOTE — Assessment & Plan Note (Signed)
Well controlled, no changes to meds. Encouraged heart healthy diet such as the DASH diet and exercise as tolerated.  °

## 2022-07-06 ENCOUNTER — Ambulatory Visit (INDEPENDENT_AMBULATORY_CARE_PROVIDER_SITE_OTHER): Payer: Medicare Other | Admitting: Family Medicine

## 2022-07-06 ENCOUNTER — Telehealth: Payer: Self-pay | Admitting: Family Medicine

## 2022-07-06 ENCOUNTER — Other Ambulatory Visit: Payer: Self-pay

## 2022-07-06 VITALS — BP 124/72 | HR 68 | Temp 98.1°F | Resp 16 | Ht 61.0 in | Wt 166.8 lb

## 2022-07-06 DIAGNOSIS — E7800 Pure hypercholesterolemia, unspecified: Secondary | ICD-10-CM | POA: Diagnosis not present

## 2022-07-06 DIAGNOSIS — I10 Essential (primary) hypertension: Secondary | ICD-10-CM

## 2022-07-06 DIAGNOSIS — E559 Vitamin D deficiency, unspecified: Secondary | ICD-10-CM

## 2022-07-06 DIAGNOSIS — N289 Disorder of kidney and ureter, unspecified: Secondary | ICD-10-CM | POA: Diagnosis not present

## 2022-07-06 DIAGNOSIS — E1142 Type 2 diabetes mellitus with diabetic polyneuropathy: Secondary | ICD-10-CM | POA: Diagnosis not present

## 2022-07-06 DIAGNOSIS — D500 Iron deficiency anemia secondary to blood loss (chronic): Secondary | ICD-10-CM

## 2022-07-06 DIAGNOSIS — Z7984 Long term (current) use of oral hypoglycemic drugs: Secondary | ICD-10-CM | POA: Diagnosis not present

## 2022-07-06 DIAGNOSIS — G459 Transient cerebral ischemic attack, unspecified: Secondary | ICD-10-CM

## 2022-07-06 DIAGNOSIS — C50411 Malignant neoplasm of upper-outer quadrant of right female breast: Secondary | ICD-10-CM

## 2022-07-06 DIAGNOSIS — C50911 Malignant neoplasm of unspecified site of right female breast: Secondary | ICD-10-CM

## 2022-07-06 DIAGNOSIS — E519 Thiamine deficiency, unspecified: Secondary | ICD-10-CM

## 2022-07-06 NOTE — Telephone Encounter (Signed)
Labs ordered future 

## 2022-07-06 NOTE — Progress Notes (Signed)
Subjective:   By signing my name below, I, [name], attest that this documentation has been prepared under the direction and in the presence of Valerie Canary, MD., 07/06/2022.   [name], female    DOB: [birth year], 82 y.o.   MRN: [id number]  Chief Complaint  Patient presents with   Follow-up    Follow up   HPI Patient is in today for an office visit. She denies recent hospitalization, febrile illness, CP/palpitations/SOB/HA/fever/chills/GI symptoms.  Diabetes Mellitus Patient states that she occasionally checks her blood glucose and that it is normally within the range of 100 mg/dL. This morning she checked her blood glucose which was 130 mg/dL. Body mass index is 31.52 kg/m. Wt Readings from Last 3 Encounters:  07/06/22 166 lb 12.8 oz (75.7 kg)  06/30/22 165 lb 12.8 oz (75.2 kg)  06/22/22 166 lb (75.3 kg)   Lab Results  Component Value Date   HGBA1C 6.7 (H) 06/26/2022   Right Breast Cancer Patient has recently been diagnosed with grade 1 right breast cancer. Her last mammogram was completed on 06/01/2022 which revealed an indeterminate irregular mass in the right breast at the 9 o'clock position. The cancer is contained and has not metastasized. She was seen by general surgeon Dr. Abigail Miyamoto on 06/23/2022 for a consultation and he will perform right breast lumpectomy on 07/31/2022. There is no family history of breast cancer and she has received surgical clearance from cardiology. Additionally, she has been referred to radiation oncology.  Urinary Frequency Patient reports that she is awakening 2-3 times nightly to urinate. She is also using urinating frequently during the day but denies dysuria and hematuria.  Supplements Patient currently takes multivitamins and thiamin supplements daily.   Past Medical History:  Diagnosis Date   Abdominal pain in female 03/18/2010   Qualifier: Diagnosis of  By: Leone Payor MD, Charlyne Quale    Anemia  06/08/2014   Anxiety    Arthritis    Spinal Osteoarthritis   Cancer (HCC)    Carcinoid tumor of stomach    Cataract    Chest pain    Myoview 12/15 no ischemia.   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 09/05/2006   Qualifier: Diagnosis of  By: Charlsie Quest RMA, Lucy     Diabetic peripheral neuropathy (HCC) 10/29/2013   Encounter for preventative adult health care exam with abnormal findings 09/14/2013   Esophageal reflux    Gastric polyp    Fundic Gland   Gastroparesis    Headache(784.0)    Heart murmur    Echocardiogram 2/11: EF 60-65%, mild LAE, grade 1 diastolic dysfunction, aortic valve sclerosis, mean gradient 9 mm of mercury, PASP 34   Hematuria 03/16/2016   Iron deficiency anemia, unspecified    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   Leg swelling    bilateral   Neck pain 04/22/2015   PONV (postoperative nausea and vomiting)    pt states only needs small amount of anesthesia   PSVT (paroxysmal supraventricular tachycardia)    Pure hypercholesterolemia    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   Renal insufficiency 06/18/2019   Stroke (HCC)    tia, 2014   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   Type II or unspecified type diabetes mellitus without mention of complication, not stated as uncontrolled    Unspecified essential hypertension    Unspecified hereditary and idiopathic peripheral neuropathy 10/29/2013    Past Surgical  History:  Procedure Laterality Date   BREAST BIOPSY Right 06/13/2022   [place] RT BREAST BX W LOC DEV 1ST LESION IMG BX SPEC US GUIDE 06/13/2022 [facility]   CHOLECYSTECTOMY  1993   COLONOSCOPY  11/11/2010   diverticulosis   DILATATION & CURRETTAGE/HYSTEROSCOPY WITH RESECTOCOPE N/A 02/25/2013   Procedure: Attempted hysteroscopy with uterine perforation;  Surgeon: Jacqualin Combes de Gwenevere Ghazi, MD;  Location: [facility];  Service: Gynecology;  Laterality: N/A;   ESOPHAGOGASTRODUODENOSCOPY  08/29/2010; 09/15/2010    Carcinoid tumor less than 1 cm in July 2012 not seen in August 2012 , gastritis, fundic gland polyps   ESOPHAGOGASTRODUODENOSCOPY  05/16/2011   ESOPHAGOGASTRODUODENOSCOPY  06/14/2012   EUS  12/15/2010   Procedure: UPPER ENDOSCOPIC ULTRASOUND (EUS) LINEAR;  Surgeon: Rob Bunting, MD;  Location: [facility];  Service: Endoscopy;  Laterality: N/A;   EYE SURGERY Bilateral    Bi lateral cateracts and bi lateral laser   LAPAROSCOPY N/A 02/25/2013   Procedure: Cystoscopy and laparoscopy with fulguration of uterine serosa;  Surgeon: Jacqualin Combes de Gwenevere Ghazi, MD;  Location: [facility];  Service: Gynecology;  Laterality: N/A;   TONSILLECTOMY      Family History  Problem Relation Age of Onset   Diabetes Mother    Stroke Father        deceased age 17   Heart disease Sister        deceased MI age 65   Diabetes Sister    Heart disease Sister    Hypertension Sister    Hyperlipidemia Sister    Diabetes Sister    Heart disease Sister    Hypertension Sister    Hyperlipidemia Sister    Heart disease Brother        deceased MI age 6   Intellectual disability Brother    Diabetes Brother    Heart disease Brother    Hypertension Brother    Hyperlipidemia Brother    Diabetes Maternal Grandmother    Hypertension Paternal Grandmother    Colon cancer Neg Hx    Esophageal cancer Neg Hx    Stomach cancer Neg Hx    Rectal cancer Neg Hx     Social History   Socioeconomic History   Marital status: Widowed    Spouse name: Not on file   Number of children: 0   Years of education: college   Highest education level: Not on file  Occupational History   Occupation: retired  Tobacco Use   Smoking status: Never   Smokeless tobacco: Never   Tobacco comments:    Never used tobacco  Vaping Use   Vaping Use: Never used  Substance and Sexual Activity   Alcohol use: No    Alcohol/week: 0.0 standard drinks of alcohol   Drug use: No   Sexual activity: Never    Partners: Male    Birth  control/protection: Post-menopausal    Comment: lives alone, no dietary restrictions except avoid fresh veg, fruit, whole grains  Other Topics Concern   Not on file  Social History Narrative   Patient was married ([name]) - widow   Patient does not have any children.   Patient is right-handed.   Patient has a BA degree.   One caffeine drink daily    Social Determinants of Health   Financial Resource Strain: Low Risk  (03/17/2021)   Overall Financial Resource Strain (CARDIA)    Difficulty of Paying Living Expenses: Not very hard  Food Insecurity: No Food Insecurity (06/30/2022)  Hunger Vital Sign    Worried About Running Out of Food in the Last Year: Never true    Ran Out of Food in the Last Year: Never true  Transportation Needs: No Transportation Needs (06/30/2022)   PRAPARE - Administrator, Civil Service (Medical): No    Lack of Transportation (Non-Medical): No  Recent Concern: Transportation Needs - Unmet Transportation Needs (06/30/2022)   PRAPARE - Administrator, Civil Service (Medical): Yes    Lack of Transportation (Non-Medical): No  Physical Activity: Insufficiently Active (03/17/2021)   Exercise Vital Sign    Days of Exercise per Week: 5 days    Minutes of Exercise per Session: 10 min  Stress: No Stress Concern Present (06/07/2021)   [employer] of Occupational Health - Occupational Stress Questionnaire    Feeling of Stress : Not at all  Social Connections: Moderately Isolated (06/07/2021)   Social Connection and Isolation Panel [NHANES]    Frequency of Communication with Friends and Family: More than three times a week    Frequency of Social Gatherings with Friends and Family: Once a week    Attends Religious Services: More than 4 times per year    Active Member of [employer] or Organizations: No    Attends Banker Meetings: Never    Marital Status: Widowed  Intimate Partner Violence: Not At Risk (06/30/2022)   Humiliation, Afraid, Rape,  and Kick questionnaire    Fear of Current or Ex-Partner: No    Emotionally Abused: No    Physically Abused: No    Sexually Abused: No    Outpatient Medications Prior to Visit  Medication Sig Dispense Refill   acetaminophen (TYLENOL) 325 MG tablet Take 2 tablets (650 mg total) by mouth every 6 (six) hours as needed for up to 30 doses for mild pain or moderate pain. 30 tablet 0   amLODipine (NORVASC) 5 MG tablet Take 1 tablet (5 mg total) by mouth daily. 100 tablet 1   aspirin EC 81 MG tablet Take 81 mg by mouth daily. Swallow whole.     betamethasone valerate ointment (VALISONE) 0.1 % Apply a small amount to affected area topically every other day. 45 g 1   Blood Glucose Monitoring Suppl (ONE TOUCH ULTRA 2) w/Device KIT USE TO CHECK BLOOD SUGAR AS DIRECTED 1 kit 0   cholecalciferol (VITAMIN D) 1000 UNITS tablet Take 1,000 Units by mouth daily.     furosemide (LASIX) 20 MG tablet TAKE 1 TABLET BY MOUTH DAILY AS  NEEDED 100 tablet 2   Lancets (ONETOUCH ULTRASOFT) lancets USE AS DIRECTED 3 TIMES  DAILY 300 each 3   losartan (COZAAR) 50 MG tablet TAKE 1 TABLET BY MOUTH TWICE  DAILY 200 tablet 2   metFORMIN (GLUCOPHAGE-XR) 500 MG 24 hr tablet Take 1 tablet (500 mg total) by mouth 2 (two) times daily. TAKE 1 TABLET BY MOUTH IN THE  MORNING AND 1 TABLET AT BEDTIME Strength: 500 mg 180 tablet 2   metoCLOPramide (REGLAN) 5 MG tablet TAKE 1 TABLET BY MOUTH TWICE  DAILY AS NEEDED 180 tablet 0   metoprolol tartrate (LOPRESSOR) 50 MG tablet Take 1 tablet (50 mg total) by mouth 2 (two) times daily. 180 tablet 3   NEXIUM 40 MG capsule Take 1 capsule (40 mg total) by mouth daily before breakfast. 90 capsule 3   ondansetron (ZOFRAN) 4 MG tablet Take 4 mg by mouth every 4 (four) hours as needed. For nausea     ONETOUCH  ULTRA test strip CHECK BLOOD SUGAR 3 TIMES DAILY  OR AS NEEDED 300 strip 2   potassium chloride (KLOR-CON M) 10 MEQ tablet TAKE 1 TABLET BY MOUTH DAILY 100 tablet 2   rosuvastatin (CRESTOR) 20  MG tablet TAKE 1 TABLET BY MOUTH DAILY 100 tablet 2   thiamine (VITAMIN B-1) 100 MG tablet Take 100 mg by mouth every other day.     predniSONE (DELTASONE) 20 MG tablet Take 40 mg daily for 3 days, then 20 mg daily for 3 days 9 tablet 0   No facility-administered medications prior to visit.    Allergies  Allergen Reactions   Tramadol Other (See Comments)    Dizziness     Review of Systems  Constitutional:  Negative for chills and fever.  Respiratory:  Negative for shortness of breath.   Cardiovascular:  Negative for chest pain and palpitations.  Gastrointestinal:  Negative for abdominal pain, blood in stool, constipation, diarrhea, nausea and vomiting.  Genitourinary:  Positive for frequency and urgency. Negative for dysuria and hematuria.  Skin:           Neurological:  Negative for headaches.       Objective:    Physical Exam Constitutional:      General: She is not in acute distress.    Appearance: Normal appearance. She is not ill-appearing.  HENT:     Head: Normocephalic and atraumatic.     Right Ear: External ear normal.     Left Ear: External ear normal.     Nose: Nose normal.     Mouth/Throat:     Mouth: Mucous membranes are moist.     Pharynx: Oropharynx is clear.  Eyes:     General:        Right eye: No discharge.        Left eye: No discharge.     Extraocular Movements: Extraocular movements intact.     Conjunctiva/sclera: Conjunctivae normal.     Pupils: Pupils are equal, round, and reactive to light.  Cardiovascular:     Rate and Rhythm: Normal rate and regular rhythm.     Pulses: Normal pulses.     Heart sounds: Normal heart sounds. No murmur heard.    No gallop.  Pulmonary:     Effort: Pulmonary effort is normal. No respiratory distress.     Breath sounds: Normal breath sounds. No wheezing or rales.  Abdominal:     General: Bowel sounds are normal.     Palpations: Abdomen is soft.     Tenderness: There is no abdominal tenderness. There is no  guarding.  Musculoskeletal:        General: Normal range of motion.     Cervical back: Normal range of motion.     Right lower leg: No edema.     Left lower leg: No edema.  Skin:    General: Skin is warm and dry.  Neurological:     Mental Status: She is alert and oriented to person, place, and time.  Psychiatric:        Mood and Affect: Mood normal.        Behavior: Behavior normal.        Judgment: Judgment normal.     BP 124/72 (BP Location: Left Arm, Patient Position: Sitting, Cuff Size: Normal)   Pulse 68   Temp 98.1 F (36.7 C) (Oral)   Resp 16   Ht 5\' 1"  (1.549 m)   Wt 166 lb 12.8 oz (75.7 kg)  LMP 02/07/1992   SpO2 96%   BMI 31.52 kg/m  Wt Readings from Last 3 Encounters:  07/06/22 166 lb 12.8 oz (75.7 kg)  06/30/22 165 lb 12.8 oz (75.2 kg)  06/22/22 166 lb (75.3 kg)    Diabetic Foot Exam - Simple   No data filed    Lab Results  Component Value Date   WBC 9.1 06/26/2022   HGB 12.4 06/26/2022   HCT 36.5 06/26/2022   PLT 240.0 06/26/2022   GLUCOSE 136 (H) 06/26/2022   CHOL 173 06/26/2022   TRIG 121.0 06/26/2022   HDL 64.50 06/26/2022   LDLDIRECT 95.0 03/25/2020   LDLCALC 84 06/26/2022   ALT 23 06/26/2022   AST 21 06/26/2022   NA 137 06/26/2022   K 4.5 06/26/2022   CL 98 06/26/2022   CREATININE 0.86 06/26/2022   BUN 18 06/26/2022   CO2 28 06/26/2022   TSH 1.87 06/26/2022   INR 0.88 01/16/2011   HGBA1C 6.7 (H) 06/26/2022   MICROALBUR 2.8 (H) 07/05/2021    Lab Results  Component Value Date   TSH 1.87 06/26/2022   Lab Results  Component Value Date   WBC 9.1 06/26/2022   HGB 12.4 06/26/2022   HCT 36.5 06/26/2022   MCV 85.9 06/26/2022   PLT 240.0 06/26/2022   Lab Results  Component Value Date   NA 137 06/26/2022   K 4.5 06/26/2022   CHLORIDE 101 07/08/2015   CO2 28 06/26/2022   GLUCOSE 136 (H) 06/26/2022   BUN 18 06/26/2022   CREATININE 0.86 06/26/2022   BILITOT 0.8 06/26/2022   ALKPHOS 60 06/26/2022   AST 21 06/26/2022   ALT 23  06/26/2022   PROT 6.8 06/26/2022   ALBUMIN 4.3 06/26/2022   CALCIUM 9.9 06/26/2022   ANIONGAP 10 05/04/2021   EGFR 75 (L) 07/08/2015   GFR 63.44 06/26/2022   Lab Results  Component Value Date   CHOL 173 06/26/2022   Lab Results  Component Value Date   HDL 64.50 06/26/2022   Lab Results  Component Value Date   LDLCALC 84 06/26/2022   Lab Results  Component Value Date   TRIG 121.0 06/26/2022   Lab Results  Component Value Date   CHOLHDL 3 06/26/2022   Lab Results  Component Value Date   HGBA1C 6.7 (H) 06/26/2022      Assessment & Plan:  Diabetes Mellitus: Patient continues to monitor blood glucose at home occasionally. Encouraged 6-8 hours of sleep, heart healthy diet, 60-80 oz of non-alcohol/non-caffeinated fluids, and 4000-8000 steps daily.  Right Breast Cancer: Patient has been referred to radiation oncology and will undergo right breast lumpectomy on 07/31/2022 under Dr. Abigail Miyamoto. The cancer is grade 1.  Labs: Routine blood work ordered. Problem List Items Addressed This Visit     Vitamin D deficiency - Primary    Supplement and monitor       TIA (transient ischemic attack)    Supplement and monitor       Renal insufficiency    Hydrate and monitor       Relevant Orders   AMB Referral to Community Care Coordinaton ([facility] Patients)   HYPERCHOLESTEROLEMIA    Encourage heart healthy diet such as MIND or DASH diet, increase exercise, avoid trans fats, simple carbohydrates and processed foods, consider a krill or fish or flaxseed oil cap daily. Tolerating Rosuvastatin      Essential hypertension (Chronic)    Well controlled, no changes to meds. Encouraged heart healthy diet such as the DASH  diet and exercise as tolerated.        Relevant Orders   AMB Referral to [facility] Coordinaton ([facility] Patients)   Diabetic peripheral neuropathy (HCC)    Still having pain and burning in her feet worse when they are more swollen encouraged to minimize sodium,  elevate feet, stay as active as able.       Relevant Orders   AMB Referral to Community Care Coordinaton ([facility] Patients)   Carcinoma of breast upper outer quadrant, right (HCC)    She is working with Dr Magnus Ivan for her surgery and radiation oncology and is ready to proceed with treatment but apprehensive about how to navigate all of this due to being alone and without support, transportation etc. Have placed a CCM referral to see if she is eligible for any assistance in this regard and beyond      Other Visit Diagnoses     Malignant neoplasm of right female breast, unspecified estrogen receptor status, unspecified site of breast (HCC)       Relevant Orders   AMB Referral to [facility] Coordinaton ([facility] Patients)      No orders of the defined types were placed in this encounter.  I, Danise Edge, MD, personally preformed the services described in this documentation.  All medical record entries made by the scribe were at my direction and in my presence.  I have reviewed the chart and discharge instructions (if applicable) and agree that the record reflects my personal performance and is accurate and complete. 07/06/2022  I,Mohammed Iqbal,acting as a scribe for Danise Edge, MD.,have documented all relevant documentation on the behalf of Danise Edge, MD,as directed by  Danise Edge, MD while in the presence of Danise Edge, MD.  Danise Edge, MD

## 2022-07-06 NOTE — Patient Instructions (Signed)
Orgain protein drinks or carnation instant breakfast with whey and/or yellow split pea powder  Breast Cancer, Female  Breast cancer is a malignant growth of tissue (tumor) in the breast. Unlike noncancerous (benign) tumors, malignant tumors are cancerous and can spread to other parts of the body. The two most common types of breast cancer start in the milk ducts (ductal carcinoma) or in the lobules where milk is made in the breast (lobular carcinoma). Breast cancer is one of the most common types of cancer in women. What are the causes? The exact cause of female breast cancer is unknown. What increases the risk? The following factors may make you more likely to develop this condition: Being older than 81 years of age. Having a family history of breast cancer. Starting menopause after age 14. Starting your menstrual periods before age 55. Having never been pregnant or having your first child after age 26. Having never breastfed. A personal history of: Breast cancer. Dense breast tissue. Radiation exposure. Having the BRCA1 and BRCA2 genes. Having certain types of benign breast conditions. Exposure to the drug DES, which was given to pregnant women from the 1940s to the 1970s. Other risks include: Using birth control pills. Using hormone therapy after menopause. Drinking more than one alcoholic drink a day. Obesity. What are the signs or symptoms? Symptoms of this condition include: A painless lump or thickening in your breast. Changes in the size or shape of your breast. Breast skin changes, such as puckering or dimpling. Nipple abnormalities, such as scaling, crustiness, redness, or pulling in (retraction). Nipple discharge that is bloody or clear. How is this diagnosed? This condition may be diagnosed by: Taking your medical history and doing a physical exam. During the exam, your health care provider will feel the tissue around your breast and under your arms. Taking a sample  of nipple discharge. The sample will be examined under a microscope. Performing imaging tests, such as breast X-rays (mammogram), ultrasound, or MRI. Taking a tissue sample (biopsy) from the breast. The sample will be examined under a microscope to look for cancer cells. Taking a sample from the lymph nodes near the affected breast (sentinel node biopsy). Your cancer will be staged to determine its severity and extent. Staging is a careful attempt to find out the size of the tumor, whether the cancer has spread, and if so, to what parts of the body. Staging also includes testing your tumor for certain receptors, such as estrogen, progesterone, and human epidermal growth factor receptor 2 (HER2). This will help your cancer care team decide on a treatment that will work best for you. You may need to have more tests to determine the stage of your cancer. Stages include the following: Stage 0--The tumor has not spread to other breast tissue. Stage 1 (I)--The cancer is only found in the breast or may be in the lymph nodes. The tumor may be up to  inch (2 cm) wide. Stage 2 (II)--The cancer has spread to nearby lymph nodes. The tumor may be up to 2 inches (5 cm) wide. Stage 3 (III)--The cancer has spread to more distant lymph nodes. The tumor may be larger than 2 inches (5 cm) wide. Stage 4 (IV)--The cancer has spread to other parts of the body, such as the bones, brain, liver, or lungs. How is this treated? Treatment for this condition depends on the type and stage of the breast cancer. It may be treated with: Surgery. This may involve breast-conserving surgery (lumpectomy or partial  mastectomy) in which only the part of the breast containing the cancer is removed. Some normal tissue surrounding this area may also be removed. In some cases, surgery may be done to remove the entire breast (mastectomy) and nipple. Lymph nodes may also be removed. Radiation therapy, which uses high-energy rays to kill cancer  cells. Chemotherapy, which is the use of medicines to kill cancer cells. Hormone therapy, which involves taking medicine to adjust the hormone levels in your body. You may take medicine to decrease your estrogen levels. This can help stop cancer cells from growing. Targeted therapy, in which medicines are used to block the growth and spread of cancer cells. These medicines target a specific part of the cancer cell and usually cause fewer side effects than chemotherapy. Targeted therapy may be used alone or in combination with chemotherapy. Immunotherapy, which is the use of medicines to boost the immune system to recognize and destroy cancer cells more effectively. A combination of surgery, radiation, chemotherapy, or hormone therapy may be needed to treat breast cancer. Follow these instructions at home: Take over-the-counter and prescription medicines only as told by your health care provider. Eat a healthy diet. A healthy diet includes lots of fruits and vegetables, low-fat dairy products, lean meats, and fiber. Make sure half your plate is filled with fruits or vegetables. Choose high-fiber foods such as whole-grain breads and cereals. Consider joining a support group. This may help you cope with the stress of having breast cancer. Talk to your health care team about exercise and physical activity. The right exercise program can: Help prevent or reduce symptoms such as fatigue or depression. Improve overall health and survival rates. Keep all follow-up visits. This is important. Where to find more information American Cancer Society: www.cancer.org National Cancer Institute: www.cancer.gov Contact a health care provider if: You have a sudden increase in pain. You have any symptoms or changes that concern you. You lose weight without trying. You notice a new lump in either breast or under your arm. You develop swelling in either arm or hand. You have a fever. You notice new fatigue or  weakness. Get help right away if: You have chest pain or trouble breathing. These symptoms may be an emergency. Get help right away. Call 911. Do not wait to see if the symptoms will go away. Do not drive yourself to the hospital. Summary Breast cancer is a malignant growth of tissue (tumor) in the breast. Your cancer will be staged to determine its severity and extent. Treatment for this condition depends on the type and stage of the breast cancer. This information is not intended to replace advice given to you by your health care provider. Make sure you discuss any questions you have with your health care provider. Document Revised: 12/13/2020 Document Reviewed: 12/13/2020 Elsevier Patient Education  2024 [employer].

## 2022-07-06 NOTE — Telephone Encounter (Signed)
Pt is scheduled to get labs the week prior to her appt in august. Please make sure orders are in.

## 2022-07-07 ENCOUNTER — Telehealth: Payer: Self-pay | Admitting: *Deleted

## 2022-07-07 NOTE — Assessment & Plan Note (Signed)
She is working with Dr Magnus Ivan for her surgery and radiation oncology and is ready to proceed with treatment but apprehensive about how to navigate all of this due to being alone and without support, transportation etc. Have placed a CCM referral to see if she is eligible for any assistance in this regard and beyond

## 2022-07-07 NOTE — Progress Notes (Signed)
  Care Coordination   Note   07/07/2022 Name: [name] MRN: [id number] DOB: [birth year]  [name] is a 82 y.o. year old female who sees [name], MD for primary care. I reached out to [employer] by phone today to offer care coordination services.  [name] was given information about Care Coordination services today including:   The Care Coordination services include support from the care team which includes your Nurse Coordinator, Clinical Social Worker, or Pharmacist.  The Care Coordination team is here to help remove barriers to the health concerns and goals most important to you. Care Coordination services are voluntary, and the patient may decline or stop services at any time by request to their care team member.   Care Coordination Consent Status: Patient agreed to services and verbal consent obtained.   Follow up plan:  Telephone appointment with care coordination team member scheduled for:  07/10/2022 and 07/24/2022  Encounter Outcome:  Pt. Scheduled from referral   Burman Nieves, [facility] Care Coordination Care Guide Direct Dial: [phone]

## 2022-07-10 ENCOUNTER — Ambulatory Visit: Payer: Self-pay | Admitting: Licensed Clinical Social Worker

## 2022-07-10 NOTE — Patient Instructions (Signed)
Social Work Visit Information  Thank you for taking time to visit with me today. Please don't hesitate to contact me if I can be of assistance to you.   Following are the goals we discussed today:   Goals Addressed             This Visit's Progress    Care Coordination Activies       Activities and task to complete in order to accomplish goals.   Call your insurance provider for more information about your Enhanced Benefits (transportation and support when you get out of the hospital) Keep all upcoming appointment discussed today Continue with compliance of taking medication prescribed by Doctor We can discuss more about assisted living options if you would like during our next call          Our next appointment is by telephone on 08/14/22 at 2:00   Please call the care guide team at [phone] if you need to cancel or reschedule your appointment.   If you or anyone you know are experiencing a Mental Health or Behavioral Health Crisis or need someone to talk to, please call the Suicide and Crisis Lifeline: 988 call the [place] National Suicide Prevention Lifeline: 339-497-1609 or TTY: 225-785-7520 TTY 978-412-9664) to talk to a trained counselor call 1-800-273-TALK (toll free, 24 hour hotline)   Patient verbalizes understanding of instructions and care plan provided today and agrees to view in MyChart. Active MyChart status and patient understanding of how to access instructions and care plan via MyChart confirmed with patient.       Sammuel Hines, LCSW Social Work Care Coordination  [employer] Emmie Niemann [employer] [phone]

## 2022-07-10 NOTE — Patient Outreach (Signed)
  Care Coordination  Initial Visit Note   07/10/2022 Name: [name] MRN: [id number] DOB: 1940/08/25  [name] is a 82 y.o. year old female who sees [name], MD for primary care. I spoke with  [name] by phone today.  What matters to the patients health and wellness today?  Managing her health with limited support  Patient lives alone, states she has no husband and no children.  63 year old sister is her only support system.  Patient currently drives and wants to stay in her home as long as she can. Sister will accompany her to surgery on June 24th.  [facility] will assist with transportation to treatment after surgery.   Goals Addressed             This Visit's Progress    Care Coordination Activies       Activities and task to complete in order to accomplish goals.   Call your insurance provider for more information about your Enhanced Benefits (transportation and support when you get out of the hospital) Keep all upcoming appointment discussed today Continue with compliance of taking medication prescribed by Doctor We can discuss more about assisted living options if you would like during our next call         SDOH assessments and interventions completed:  Yes  SDOH Interventions Today    Flowsheet Row Most Recent Value  SDOH Interventions   Stress Interventions Provide Counseling       Care Coordination Interventions:  Yes, provided  Interventions Today    Flowsheet Row Most Recent Value  Chronic Disease   Chronic disease during today's visit Diabetes, Hypertension (HTN)  General Interventions   General Interventions Discussed/Reviewed General Interventions Discussed, Level of Care  [solution focused, task centered, problem solving and emotional support]  Level of Care Assisted Living, Personal Care Services  [discussed options and what she has done]  Education Interventions   Education Provided Provided Education   Provided Verbal Education On [employer], Mental Health/Coping with Illness  Safety Interventions   Safety Discussed/Reviewed Safety Discussed       Follow up plan: Follow up call scheduled for 08/14/22    Encounter Outcome:  Pt. Visit Completed   Sammuel Hines, LCSW Social Work Care Coordination  [facility] [employer] [phone]

## 2022-07-17 ENCOUNTER — Inpatient Hospital Stay: Payer: Medicare Other | Attending: Hematology & Oncology

## 2022-07-17 ENCOUNTER — Encounter: Payer: Self-pay | Admitting: Hematology & Oncology

## 2022-07-17 ENCOUNTER — Inpatient Hospital Stay: Payer: Medicare Other | Admitting: Hematology & Oncology

## 2022-07-17 ENCOUNTER — Encounter: Payer: Self-pay | Admitting: *Deleted

## 2022-07-17 VITALS — BP 130/50 | HR 65 | Temp 98.9°F | Resp 20 | Ht 61.0 in | Wt 166.0 lb

## 2022-07-17 DIAGNOSIS — C50411 Malignant neoplasm of upper-outer quadrant of right female breast: Secondary | ICD-10-CM

## 2022-07-17 DIAGNOSIS — C50911 Malignant neoplasm of unspecified site of right female breast: Secondary | ICD-10-CM | POA: Insufficient documentation

## 2022-07-17 DIAGNOSIS — Z170 Estrogen receptor positive status [ER+]: Secondary | ICD-10-CM | POA: Diagnosis not present

## 2022-07-17 DIAGNOSIS — Z7982 Long term (current) use of aspirin: Secondary | ICD-10-CM | POA: Diagnosis not present

## 2022-07-17 DIAGNOSIS — K909 Intestinal malabsorption, unspecified: Secondary | ICD-10-CM | POA: Diagnosis not present

## 2022-07-17 DIAGNOSIS — D508 Other iron deficiency anemias: Secondary | ICD-10-CM | POA: Diagnosis not present

## 2022-07-17 DIAGNOSIS — D500 Iron deficiency anemia secondary to blood loss (chronic): Secondary | ICD-10-CM

## 2022-07-17 DIAGNOSIS — Z79899 Other long term (current) drug therapy: Secondary | ICD-10-CM | POA: Diagnosis not present

## 2022-07-17 LAB — CBC WITH DIFFERENTIAL (CANCER CENTER ONLY)
Abs Immature Granulocytes: 0.03 10*3/uL (ref 0.00–0.07)
Basophils Absolute: 0.1 10*3/uL (ref 0.0–0.1)
Basophils Relative: 1 %
Eosinophils Absolute: 0.1 10*3/uL (ref 0.0–0.5)
Eosinophils Relative: 1 %
HCT: 38.6 % (ref 36.0–46.0)
Hemoglobin: 12.6 g/dL (ref 12.0–15.0)
Immature Granulocytes: 0 %
Lymphocytes Relative: 31 %
Lymphs Abs: 2.9 10*3/uL (ref 0.7–4.0)
MCH: 28.4 pg (ref 26.0–34.0)
MCHC: 32.6 g/dL (ref 30.0–36.0)
MCV: 86.9 fL (ref 80.0–100.0)
Monocytes Absolute: 0.8 10*3/uL (ref 0.1–1.0)
Monocytes Relative: 8 %
Neutro Abs: 5.6 10*3/uL (ref 1.7–7.7)
Neutrophils Relative %: 59 %
Platelet Count: 142 10*3/uL — ABNORMAL LOW (ref 150–400)
RBC: 4.44 MIL/uL (ref 3.87–5.11)
RDW: 12.1 % (ref 11.5–15.5)
WBC Count: 9.5 10*3/uL (ref 4.0–10.5)
nRBC: 0 % (ref 0.0–0.2)

## 2022-07-17 LAB — FERRITIN: Ferritin: 259 ng/mL (ref 11–307)

## 2022-07-17 LAB — RETICULOCYTES
Immature Retic Fract: 8.4 % (ref 2.3–15.9)
RBC.: 4.32 MIL/uL (ref 3.87–5.11)
Retic Count, Absolute: 73 10*3/uL (ref 19.0–186.0)
Retic Ct Pct: 1.7 % (ref 0.4–3.1)

## 2022-07-17 NOTE — Progress Notes (Signed)
Hematology and Oncology Follow Up Visit  [name] [id number] 06-07-1940 82 y.o. 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 is back for a new visit.  We have been seeing her for quite a while because of iron deficiency.  Now, however, the fact that she has breast cancer on the right breast.  She has been doing incredibly well.  She has been getting her mammograms routinely.  She had a recent mammogram.  She also noted a lump in the right breast.  She had a mammogram that was done on 06/01/2022.  This showed a mass measured 13 x 15 x 15 mm.  This was at the 9 o'clock position in the right breast.  She then underwent a biopsy.  This was done on 06/13/2022.  The pathology report ([id number]) showed an invasive ductal carcinoma.  It had overall grade of 1.  There is no lymphovascular invasion.  The tumor itself was ER positive/PR positive/HER2 negative.  There was a low Ki67 of 10%.  She is going to have surgery with a lumpectomy on 07/31/2022.  She is seeing Radiation Oncology.  I realize that she is 82 years old.  However, she was certainly would still be a candidate for radiation.  She has had no problems with pain.  She has had no cough or shortness of breath.  She has had no nausea or vomiting.  There has been no change in bowel or bladder habits.  Medications:  Current Outpatient Medications:    acetaminophen (TYLENOL) 325 MG tablet, Take 2 tablets (650 mg total) by mouth every 6 (six) hours as needed for up to 30 doses for mild pain or moderate pain., Disp: 30 tablet, Rfl: 0   amLODipine (NORVASC) 5 MG tablet, Take 1 tablet (5 mg total) by mouth daily., Disp: 100 tablet, Rfl: 1   aspirin EC 81 MG tablet, Take 81 mg by mouth daily. Swallow whole., Disp: , Rfl:    Blood Glucose Monitoring Suppl (ONE TOUCH ULTRA 2) w/Device KIT, USE  TO CHECK BLOOD SUGAR AS DIRECTED, Disp: 1 kit, Rfl: 0   calcium-vitamin D (OSCAL WITH D) 500-5 MG-MCG tablet, Take 1 tablet by mouth., Disp: , Rfl:    cholecalciferol (VITAMIN D) 1000 UNITS tablet, Take 1,000 Units by mouth daily., Disp: , Rfl:    furosemide (LASIX) 20 MG tablet, TAKE 1 TABLET BY MOUTH DAILY AS  NEEDED, Disp: 100 tablet, Rfl: 2   Lancets (ONETOUCH ULTRASOFT) lancets, USE AS DIRECTED 3 TIMES  DAILY, Disp: 300 each, Rfl: 3   losartan (COZAAR) 50 MG tablet, TAKE 1 TABLET BY MOUTH TWICE  DAILY, Disp: 200 tablet, Rfl: 2   metFORMIN (GLUCOPHAGE-XR) 500 MG 24 hr tablet, Take 1 tablet (500 mg total) by mouth 2 (two) times daily. TAKE 1 TABLET BY MOUTH IN THE  MORNING AND 1 TABLET AT BEDTIME Strength: 500 mg, Disp: 180 tablet, Rfl: 2   metoprolol tartrate (LOPRESSOR) 50 MG tablet, Take 1 tablet (50 mg total) by mouth 2 (two) times daily., Disp: 180 tablet, Rfl: 3   NEXIUM 40 MG capsule, Take 1 capsule (40 mg total) by mouth daily before breakfast., Disp: 90 capsule, Rfl: 3   ONETOUCH ULTRA test strip, CHECK BLOOD SUGAR 3 TIMES DAILY  OR AS NEEDED, Disp: 300 strip, Rfl: 2   potassium chloride (KLOR-CON M) 10 MEQ  tablet, TAKE 1 TABLET BY MOUTH DAILY, Disp: 100 tablet, Rfl: 2   rosuvastatin (CRESTOR) 20 MG tablet, TAKE 1 TABLET BY MOUTH DAILY, Disp: 100 tablet, Rfl: 2   thiamine (VITAMIN B-1) 100 MG tablet, Take 100 mg by mouth every other day., Disp: , Rfl:    vitamin E 180 MG (400 UNITS) capsule, Take 400 Units by mouth daily., Disp: , Rfl:    betamethasone valerate ointment (VALISONE) 0.1 %, Apply a small amount to affected area topically every other day. (Patient not taking: Reported on 07/17/2022), Disp: 45 g, Rfl: 1   metoCLOPramide (REGLAN) 5 MG tablet, TAKE 1 TABLET BY MOUTH TWICE  DAILY AS NEEDED (Patient not taking: Reported on 07/17/2022), Disp: 180 tablet, Rfl: 0   ondansetron (ZOFRAN) 4 MG tablet, Take 4 mg by mouth every 4 (four) hours as needed. For nausea (Patient not taking:  Reported on 07/17/2022), Disp: , Rfl:   Allergies:  Allergies  Allergen Reactions   Tramadol Other (See Comments)    Dizziness     Past Medical History, Surgical history, Social history, and Family History were reviewed and updated.  Review of Systems: Review of Systems  Constitutional: Negative.   HENT:  Negative.    Eyes: Negative.   Respiratory: Negative.    Cardiovascular: Negative.   Gastrointestinal: Negative.   Endocrine: Negative.   Genitourinary: Negative.    Musculoskeletal: Negative.   Skin: Negative.   Neurological: Negative.   Hematological: Negative.   Psychiatric/Behavioral: Negative.      Physical Exam:  height is 5\' 1"  (1.549 m) and weight is 166 lb (75.3 kg). Her oral temperature is 98.9 F (37.2 C). Her blood pressure is 130/50 (abnormal) and her pulse is 65. Her respiration is 20 and oxygen saturation is 100%.   Wt Readings from Last 3 Encounters:  07/17/22 166 lb (75.3 kg)  07/06/22 166 lb 12.8 oz (75.7 kg)  06/30/22 165 lb 12.8 oz (75.2 kg)    Physical Exam Vitals reviewed.  Constitutional:      Comments: Left breast with no masses, edema or erythema.  There is no left axillary adenopathy.  Right breast shows the biopsy scar at about the 9 o'clock position.  There is little bit of erythema and ecchymoses.  There is no distinct mass.  There is no palpable right axillary adenopathy.  HENT:     Head: Normocephalic and atraumatic.  Eyes:     Pupils: Pupils are equal, round, and reactive to light.  Cardiovascular:     Rate and Rhythm: Normal rate and regular rhythm.     Heart sounds: Normal heart sounds.  Pulmonary:     Effort: Pulmonary effort is normal.     Breath sounds: Normal breath sounds.  Abdominal:     General: Bowel sounds are normal.     Palpations: Abdomen is soft.  Musculoskeletal:        General: No tenderness or deformity. Normal range of motion.     Cervical back: Normal range of motion.  Lymphadenopathy:     Cervical: No  cervical adenopathy.  Skin:    General: Skin is warm and dry.     Findings: No erythema or rash.  Neurological:     Mental Status: She is alert and oriented to person, place, and time.  Psychiatric:        Behavior: Behavior normal.        Thought Content: Thought content normal.        Judgment: Judgment normal.  Lab Results  Component Value Date   WBC 9.5 07/17/2022   HGB 12.6 07/17/2022   HCT 38.6 07/17/2022   MCV 86.9 07/17/2022   PLT 142 (L) 07/17/2022     Chemistry      Component Value Date/Time   NA 137 06/26/2022 0927   NA 136 05/08/2016 1305   NA 134 (L) 07/08/2015 1149   K 4.5 06/26/2022 0927   K 4.2 05/08/2016 1305   K 4.4 07/08/2015 1149   CL 98 06/26/2022 0927   CL 102 05/08/2016 1305   CO2 28 06/26/2022 0927   CO2 26 05/08/2016 1305   CO2 25 07/08/2015 1149   BUN 18 06/26/2022 0927   BUN 16 05/08/2016 1305   BUN 15.5 07/08/2015 1149   CREATININE 0.86 06/26/2022 0927   CREATININE 0.91 08/05/2021 1415   CREATININE 0.8 07/08/2015 1149      Component Value Date/Time   CALCIUM 9.9 06/26/2022 0927   CALCIUM 9.4 05/08/2016 1305   CALCIUM 10.0 07/08/2015 1149   ALKPHOS 60 06/26/2022 0927   ALKPHOS 56 05/08/2016 1305   ALKPHOS 62 07/08/2015 1149   AST 21 06/26/2022 0927   AST 19 05/04/2021 1345   AST 24 07/08/2015 1149   ALT 23 06/26/2022 0927   ALT 22 05/04/2021 1345   ALT 29 05/08/2016 1305   ALT 30 07/08/2015 1149   BILITOT 0.8 06/26/2022 0927   BILITOT 0.7 05/04/2021 1345   BILITOT 0.64 07/08/2015 1149      Impression and Plan: Ms. Stockmann is a very charming 82 year old [place] female.  She now has what I had to believe is going to be a stage I breast cancer.  I see nothing on the pathology so far that would consider this to be an aggressive tumor.  I think that the proliferation marker clearly is a positive factor.  I do not see a problem with her having a lumpectomy.  Again having radiation after lumpectomy certainly would be  reasonable.  I would still consider sending her tumor off for Oncotype testing once we get the result back.  I think she would clearly benefit from aromatase inhibitor therapy.  For right now, we will have her come back to see me in about a month.  By then, I should have the results back that I need to make any additional recommendations.   Josph Macho, MD 6/10/20243:05 PM

## 2022-07-17 NOTE — Addendum Note (Signed)
Addended by: Gwendel Hanson on: 07/17/2022 04:03 PM   Modules accepted: Orders

## 2022-07-17 NOTE — Progress Notes (Signed)
Patient came in today to discuss hormone therapy. She wanted a better understanding of it prior to surgery.   Oncology Nurse Navigator Documentation     07/17/2022    2:00 PM  Oncology Nurse Navigator Flowsheets  Navigator Follow Up Date: 07/31/2022  Navigator Follow Up Reason: Surgery  Navigator Location [facility]  Navigator Encounter Type Follow-up Appt  Patient Visit Type MedOnc  Treatment Phase Pre-Tx/Tx Discussion  Barriers/Navigation Needs Coordination of Care  Interventions None Required  Acuity Level 2-Minimal Needs (1-2 Barriers Identified)  Support Groups/Services Friends and Family  Time Spent with Patient 15

## 2022-07-18 LAB — IRON AND IRON BINDING CAPACITY (CC-WL,HP ONLY)
Iron: 71 ug/dL (ref 28–170)
Saturation Ratios: 19 % (ref 10.4–31.8)
TIBC: 371 ug/dL (ref 250–450)
UIBC: 300 ug/dL (ref 148–442)

## 2022-07-19 ENCOUNTER — Other Ambulatory Visit: Payer: Medicare Other | Admitting: Licensed Clinical Social Worker

## 2022-07-19 ENCOUNTER — Inpatient Hospital Stay: Payer: Medicare Other | Admitting: Licensed Clinical Social Worker

## 2022-07-19 NOTE — Progress Notes (Signed)
[facility] Clinical Social Work  Initial Assessment   [name] is a 82 y.o. year old female contacted by phone. Clinical Social Work was referred by nurse navigator for assessment of psychosocial needs.   SDOH (Social Determinants of Health) assessments performed: Yes SDOH Interventions    Flowsheet Row Care Coordination from 07/10/2022 in [facility] Community Care Coordination Clinical Support from 06/07/2021 in [facility] Primary Care at [facility] Chronic Care Management from 03/17/2021 in [facility] at [facility] Chronic Care Management from 11/16/2020 in [facility] Primary Care at [facility] Chronic Care Management from 11/09/2020 in [facility] Primary Care at [facility]  SDOH Interventions       Food Insecurity Interventions -- -- -- -- Intervention Not Indicated  Housing Interventions -- Intervention Not Indicated -- -- --  Transportation Interventions -- -- -- -- Intervention Not Indicated  Financial Strain Interventions -- -- Intervention Not Indicated Intervention Not Indicated --  Physical Activity Interventions -- -- Other (Comments)  [Encouraged patient to start walking daily - 5 to 10 minues and increase as able] -- --  Stress Interventions Provide Counseling Intervention Not Indicated -- -- --  Social Connections Interventions -- Intervention Not Indicated -- -- --       SDOH Screenings   Food Insecurity: No Food Insecurity (06/30/2022)  Housing: Low Risk  (06/30/2022)  Transportation Needs: No Transportation Needs (06/30/2022)  Recent Concern: Transportation Needs - Unmet Transportation Needs (06/30/2022)  Utilities: Not At Risk (06/30/2022)  Alcohol Screen: Low Risk  (06/07/2021)  Depression (PHQ2-9): Low Risk  (07/06/2022)  Financial Resource Strain: Low Risk  (03/17/2021)  Physical Activity: Insufficiently Active (03/17/2021)  Social Connections: Moderately Isolated (06/07/2021)   Stress: Stress Concern Present (07/10/2022)  Tobacco Use: Low Risk  (07/17/2022)     Distress Screen completed: No    10/22/2014    2:04 PM  ONCBCN DISTRESS SCREENING  Screening Type Initial Screening  Distress experienced in past week (1-10) 0      Family/Social Information:  Housing Arrangement: patient lives alone. Family members/support persons in your life? Family.  [name] lives in [place].  She has a niece and nephew who are supportive also.  Her husband died 9 years ago. Transportation concerns: Patient does not like to drive.  Once she begins treatment she will need assistance with transportation.  Employment: Retired  Income source: Actor concerns: No Type of concern: None Food access concerns: no Religious or spiritual practice: Not known Services Currently in place:  Medicare  Coping/ Adjustment to diagnosis: Patient understands treatment plan and what happens next? yes Concerns about diagnosis and/or treatment:  Getting to her treatment is concerning for her. 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: Family Patient enjoys reading and watching TV Current coping skills/ strengths: Active sense of humor , Capable of independent living , Communication skills , Contractor , General fund of knowledge , and Supportive family/friends     SUMMARY: Current SDOH Barriers:  Transportation  Clinical Social Work Clinical Goal(s):  Explore community resource options for unmet needs related to:  Transportation  Interventions: Discussed common feeling and emotions when being diagnosed with cancer, and the importance of support during treatment Informed patient of the support team roles and support services at [facility] Provided CSW contact information and encouraged patient to call with any questions or concerns Provided patient with information about available resources.  She agreed to  have CSW mail her  [address] and [address] and [employer].   Follow Up Plan: Patient will contact CSW with any support or resource needs Patient verbalizes understanding of plan: Yes    Dorothey Baseman, LCSW Clinical Social Worker [facility]

## 2022-07-20 ENCOUNTER — Ambulatory Visit: Payer: Medicare Other | Admitting: Dietician

## 2022-07-20 NOTE — Progress Notes (Signed)
Nutrition Assessment: Reached out to patient at home telephone number.    Reason for Assessment: New Patient Assessment   ASSESSMENT: Patient is know to Dr. 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  Lumpectomy is planned and potential radiation to follow.  Patient report lives alone and doesn't cook much for herself.  She is concerned with blood sugars as they have been running high lately.  She: test every night and has had some reading at 200-250 after she eats. Checks before bed she worries about low blood sugar.  She doesn't report any below 70 but states she feels bad if it goes as low as 85 (sweaty and nauseous.)  Doesn't tolerate cheese, or yogurt, gives gas. Avoids rice, and pasta, avoids salty meats, cold cuts due to sodium and concerns with blood pressure.  She stated cardiologist told her not to eat more than 2 eggs a week.  Breakfast: 1-2 eggs (sometimes just whites), 1 slice pita Lunch: leftovers  (like humus, likes lentils) Dinner: fish, chicken, salad or vegetables (cucumbers, lettuces, tomatoes, cauliflower) Fruits: apples, grapes, avocados, pears, bananas  Fluids: coffee with little sugar sometimes black 2 cups (like strong Turkish coffee), water,    Nutrition Focused Physical Exam: unable to perform NFPE   Medications: Vit. B1, Vit. D,    Labs: 06/27/22  Vit B1 7, Hgb A1C 6.7   Anthropometrics:  stable within 4# range for past couple of years  Height: 61" Weight:  07/17/22  166# UBW: 165-169# BMI: 31.37   Estimated Energy Needs  Kcals: 1610-9604 Protein: 90-113 Fluid: 2.5L   NUTRITION DIAGNOSIS: none at this time, intervention below addressed patient's questions and concerns   INTERVENTION:   Relayed that nutrition services are wrap around service provided at no charge and encouraged continued communication if experiencing any nutritional impact symptoms (NIS). Educated on  importance of adequate nourishment with calorie and protein energy intake with nutrient dense foods when possible to promote healing, maintain weight/strength and QOL.   Encouraged high protein foods at each meal/snack.  Encouraged use of MVI w/min to aid in healing and address Vit B12 interaction with Metformin. Tried to quell concerns for blood sugar control by reinforcing that her current HGA1C indicates good blood sugar control and that stress may be contributing to recent stress.  Suggested low carb oral nutrition supplement at HS to aid in prevention of low blood sugars over night and boost protein intake. Discussed strategies for  Per patient request mailed sample menu and Glucerna coupons with contact information provided.  MONITORING, EVALUATION, GOAL: weight trends, nutrition impact symptoms, PO intake, labs   Next Visit: Remote next month after Oncology follow up appointment.  Gennaro Africa, RDN, LDN Registered Dietitian, [facility] Part Time Remote (Usual office hours: Tuesday-Thursday) Mobile: [phone]

## 2022-07-21 ENCOUNTER — Encounter (HOSPITAL_BASED_OUTPATIENT_CLINIC_OR_DEPARTMENT_OTHER): Payer: Self-pay | Admitting: Surgery

## 2022-07-21 ENCOUNTER — Other Ambulatory Visit: Payer: Self-pay

## 2022-07-23 ENCOUNTER — Other Ambulatory Visit: Payer: Self-pay | Admitting: Cardiology

## 2022-07-26 ENCOUNTER — Ambulatory Visit: Payer: Self-pay

## 2022-07-26 ENCOUNTER — Telehealth: Payer: Self-pay

## 2022-07-26 ENCOUNTER — Other Ambulatory Visit: Payer: Self-pay | Admitting: Internal Medicine

## 2022-07-26 NOTE — Patient Outreach (Unsigned)
  Care Coordination   Initial Visit Note   07/26/2022 Name: [name] MRN: [id number] DOB: March 13, 1940  [name] is a 82 y.o. year old female who sees [name], MD for primary care. I spoke with  [name] by phone today.  What matters to the patients health and wellness today?  Patient reports she has recently been diagnosed with cancer and will have surgery on 07/31/22.  Goals Addressed             This Visit's Progress    assist with health management       Interventions Today    Flowsheet Row Most Recent Value  Chronic Disease   Chronic disease during today's visit Diabetes, Other  [right breast ca stage 1]  General Interventions   General Interventions Discussed/Reviewed General Interventions Discussed  [confirmed she has RNCM contact number and encouraged to call for care coordination needs as needed.]  Exercise Interventions   Exercise Discussed/Reviewed Physical Activity  Education Interventions   Education Provided Provided Education  [reiterated resources at the cancer center: nutritionist, LCSW, care navigator,  confirmed patient has contact numbers.  advised to contact [facility] re: process for obtaining transportation and encouraged to follow up with LCSW for transportation needs also.]  Provided Verbal Education On Nutrition  [discussed importance of eating protein and nutritional eating with multivitamins and minerals as directed per nutritionist on 07/20/22. discussed foods rich in protein. discussed importance of blood sugar control]  Nutrition Interventions   Nutrition Discussed/Reviewed Nutrition Discussed            SDOH assessments and interventions completed:  No{THN Tip this will not be part of the note when signed-REQUIRED REPORT FIELD DO NOT DELETE (Optional):27901}  Care Coordination Interventions:  Yes, provided {THN Tip this will not be part of the note when signed-REQUIRED REPORT FIELD DO NOT DELETE  (Optional):27901}  Follow up plan: Follow up call scheduled for 08/08/22    Encounter Outcome:  Pt. Visit Completed {THN Tip this will not be part of the note when signed-REQUIRED REPORT FIELD DO NOT DELETE (Optional):27901}  Kathyrn Sheriff, RN, MSN, BSN, CCM [facility] Care Coordinator [phone]

## 2022-07-26 NOTE — Telephone Encounter (Signed)
Not sure who told her that , will check with Dr Leone Payor.

## 2022-07-26 NOTE — Telephone Encounter (Signed)
PT is calling to get an update on refill for Reglan through [facility]. She was advised she had to come in for an OV first and wanted to confirm. Requesting call back. Please leave a voicemail if no answer

## 2022-07-26 NOTE — Patient Instructions (Signed)
Visit Information  Thank you for taking time to visit with me today. Please don't hesitate to contact me if I can be of assistance to you.   Following are the goals we discussed today:  Continue to take medications as prescribed Continue to attend provider visits as scheduled Resources at to assist at the Nurse  Navigator, oncology nutritionist and oncology  licensed clinical social worker.  Re transportation: you can contact the oncology licensed clinical social worker to assist with transportation needs, as well as, you insurance provider to access your transportation benefits.  Our next appointment is by telephone on 08/08/22 at 1:30 pm  Please call the care guide team at [phone] if you need to cancel or reschedule your appointment.   If you are experiencing a Mental Health or Behavioral Health Crisis or need someone to talk to, please call the Suicide and Crisis Lifeline: 24  Kathyrn Sheriff, RN, MSN, BSN, CCM [facility] Care Coordinator [phone]

## 2022-07-26 NOTE — Telephone Encounter (Signed)
Please advise Sir, thank you. 

## 2022-07-26 NOTE — Telephone Encounter (Signed)
She uses prn - I refilled it  She can see me Oct/Nov please

## 2022-07-26 NOTE — Patient Outreach (Signed)
  Care Coordination   07/26/2022 Name: [name] MRN: [id number] DOB: 11/05/1940   Care Coordination Outreach Attempts:  An unsuccessful telephone outreach was attempted for a scheduled appointment today.  Follow Up Plan:  Additional outreach attempts will be made to offer the patient care coordination information and services.   Encounter Outcome:  No Answer   Care Coordination Interventions:  No, not indicated    Kathyrn Sheriff, RN, MSN, BSN, CCM [facility] Care Coordinator [phone]

## 2022-07-26 NOTE — Telephone Encounter (Signed)
I put her on the books for Oct 15th. She said thank you for sending in the rx. She wanted Dr Leone Payor to pray for her-she is having breast cancer surgery on Monday June 24th. I will route this to him.

## 2022-07-27 ENCOUNTER — Encounter (HOSPITAL_BASED_OUTPATIENT_CLINIC_OR_DEPARTMENT_OTHER)
Admission: RE | Admit: 2022-07-27 | Discharge: 2022-07-27 | Disposition: A | Discharge status: Home / Self Care | Payer: Medicare Other | Source: Ambulatory Visit | Attending: Surgery | Admitting: Surgery

## 2022-07-27 DIAGNOSIS — E119 Type 2 diabetes mellitus without complications: Secondary | ICD-10-CM | POA: Diagnosis not present

## 2022-07-27 DIAGNOSIS — Z01812 Encounter for preprocedural laboratory examination: Secondary | ICD-10-CM | POA: Insufficient documentation

## 2022-07-27 LAB — BASIC METABOLIC PANEL
Anion gap: 13 (ref 5–15)
BUN: 19 mg/dL (ref 8–23)
CO2: 21 mmol/L — ABNORMAL LOW (ref 22–32)
Calcium: 9.7 mg/dL (ref 8.9–10.3)
Chloride: 99 mmol/L (ref 98–111)
Creatinine, Ser: 0.85 mg/dL (ref 0.44–1.00)
GFR, Estimated: >60 mL/min (ref >60)
Glucose, Bld: 125 mg/dL — ABNORMAL HIGH (ref 70–99)
Potassium: 4.5 mmol/L (ref 3.5–5.1)
Sodium: 133 mmol/L — ABNORMAL LOW (ref 135–145)

## 2022-07-27 MED ORDER — CHLORHEXIDINE GLUCONATE CLOTH 2 % EX PADS: 6.0000 | MEDICATED_PAD | Freq: Once | CUTANEOUS | Status: DC

## 2022-07-27 MED ORDER — ENSURE PRE-SURGERY PO LIQD: 296.0000 mL | Freq: Once | ORAL | Status: DC

## 2022-07-27 NOTE — Progress Notes (Signed)

## 2022-07-30 NOTE — H&P (Signed)
REFERRING PHYSICIAN: [name], MD PROVIDER: Wayne Both, MD MRN: [id number] DOB: 1940/06/21  Subjective  Chief Complaint: NEW BREAST CANCER ( INVASIVE MAMMARY CARCINOMA)  History of Present Illness: [name] is a 82 y.o. female who is seen today as an office consultation for evaluation of NEW BREAST CANCER ( INVASIVE MAMMARY CARCINOMA)  This is an 82 year old female who is referred here for evaluation of a right breast cancer. She reported feeling a mass approximately 2 months ago. She underwent mammograms and ultrasound showing a 1.3 x 1.5 x 1.5 cm mass at the 9 o'clock position of the right breast 3 cm from the nipple. She underwent a biopsy which showed both invasive and in situ ductal carcinoma which was grade 1. It was 100% ER positive, 85% PR positive, HER2 negative, and had a Ki-67 of 10%. Ultrasound the axilla was negative. She is otherwise without complaints. There is no family history of breast cancer. She has no cardiopulmonary issues and has been cleared by cardiology for surgery already. She denies nipple discharge.  Review of Systems: A complete review of systems was obtained from the patient. I have reviewed this information and discussed as appropriate with the patient. See HPI as well for other ROS.  ROS  Medical History: Past Medical History: Diagnosis Date Anemia Anxiety Arthritis Chronic kidney disease Diabetes mellitus without complication (CMS/HHS-HCC) GERD (gastroesophageal reflux disease) History of cancer History of stroke Hyperlipidemia Hypertension  There is no problem list on file for this patient.  Past Surgical History: Procedure Laterality Date CHOLECYSTECTOMY 1993 Dilatation & currettage/hysteroscopy with resectocope 02/25/2013 Breast biopsy (Right) 06/13/2022 Eye surgery (Bilateral) TONSILLECTOMY   Allergies Allergen Reactions Tramadol Other (See Comments) Dizziness  Current Outpatient Medications on File Prior  to Visit Medication Sig Dispense Refill acetaminophen (TYLENOL) 325 MG tablet Take by mouth amLODIPine (NORVASC) 5 MG tablet Take 5 mg by mouth once daily aspirin 81 MG EC tablet Take 81 mg by mouth once daily cholecalciferol (VITAMIN D3) 1000 unit tablet Take 1,000 Units by mouth once daily esomeprazole (NEXIUM) 40 MG DR capsule Take 1 capsule by mouth once daily FUROsemide (LASIX) 20 MG tablet Take 1 tablet by mouth as needed losartan (COZAAR) 50 MG tablet Take 1 tablet by mouth 2 (two) times daily metFORMIN (GLUCOPHAGE-XR) 500 MG XR tablet Take by mouth metoclopramide (REGLAN) 5 MG tablet Take 1 tablet by mouth 2 (two) times daily as needed metoprolol tartrate (LOPRESSOR) 50 MG tablet Take 1 tablet by mouth 2 (two) times daily ondansetron (ZOFRAN-ODT) 4 MG disintegrating tablet 1 tablet on the tongue and allow to dissolve Orally Once a day for 30 day(s) potassium chloride (KLOR-CON M10) 10 mEq ER tablet Take 1 tablet by mouth once daily rosuvastatin (CRESTOR) 20 MG tablet Take 20 mg by mouth once daily vitamin E 400 UNIT capsule Take 400 Units by mouth once daily  No current facility-administered medications on file prior to visit.  Family History Problem Relation Age of Onset Diabetes Mother Stroke Father Coronary Artery Disease (Blocked arteries around heart) Sister High blood pressure (Hypertension) Sister Hyperlipidemia (Elevated cholesterol) Sister Diabetes Sister High blood pressure (Hypertension) Brother Hyperlipidemia (Elevated cholesterol) Brother Coronary Artery Disease (Blocked arteries around heart) Brother Diabetes Brother Diabetes Maternal Grandmother High blood pressure (Hypertension) Paternal Grandmother   Social History  Tobacco Use Smoking Status Never Smokeless Tobacco Never   Social History  Socioeconomic History Marital status: Widowed Tobacco Use Smoking status: Never Smokeless tobacco: Never Substance and Sexual Activity Alcohol use: Not  Currently Drug use:  Never  Social Determinants of Health  Financial Resource Strain: Low Risk (03/17/2021) Received from [facility] Overall Financial Resource Strain (CARDIA) Difficulty of Paying Living Expenses: Not very hard Food Insecurity: No Food Insecurity (11/09/2020) Received from [facility] Hunger Vital Sign Worried About Running Out of Food in the Last Year: Never true Ran Out of Food in the Last Year: Never true Transportation Needs: No Transportation Needs (11/09/2020) Received from [facility] - Transportation Lack of Transportation (Medical): No Lack of Transportation (Non-Medical): No Physical Activity: Insufficiently Active (03/17/2021) Received from [facility] Exercise Vital Sign Days of Exercise per Week: 5 days Minutes of Exercise per Session: 10 min Stress: No Stress Concern Present (06/07/2021) Received from [facility] of Occupational Health - Occupational Stress Questionnaire Feeling of Stress : Not at all Social Connections: Moderately Isolated (06/07/2021) Received from [facility] Social Connection and Isolation Panel [NHANES] Frequency of Communication with Friends and Family: More than three times a week Frequency of Social Gatherings with Friends and Family: Once a week Attends Religious Services: More than 4 times per year Active Member of [employer] or Organizations: No Attends Banker Meetings: Never Marital Status: Widowed  Objective:  Vitals:  BP: 122/76 Pulse: 67 Temp: 36.6 C (97.8 F) SpO2: 96% Weight: 75 kg (165 lb 6.4 oz) Height: 154.9 cm (5\' 1" ) PainSc: 2 PainLoc: Breast  Body mass index is 31.25 kg/m.  Physical Exam  She appears well on exam  There is significant ecchymosis and bruising with a hematoma of the right breast. I can palpate the mass at the 9 o'clock position of the right breast. It is mobile and about 1-1/2 cm.  No axillary adenopathy.  The nipple areolar complex is  normal  Labs, Imaging and Diagnostic Testing: I have reviewed her mammograms, ultrasound, and pathology results  Assessment and Plan:  Diagnoses and all orders for this visit:  Invasive ductal carcinoma of breast, female, right (CMS/HHS-HCC) - Ambulatory Referral to Radiation Oncology - Ambulatory Referral to Oncology-Medical   I had a long discussion with the patient and her friend regarding the diagnosis of breast cancer. I gave them a copy of the pathology report and we went over extensively. From a surgical standpoint we discussed breast conservation with a lumpectomy and possible radiation versus mastectomy. We discussed the long-term outcomes of both. She is interested in breast conservation. I next discussed proceeding with a right breast lumpectomy. I explained the surgical procedure in detail. We discussed the risks which includes but is not limited to bleeding, infection, injury to surrounding structures, the need for further surgery if margins are positive, etc. We discussed the reasons not to biopsy lymph nodes at this point. After long discussion, she understands and agrees to proceed with surgery. She will also be referred to both medical and radiation oncology at the cancer center.

## 2022-07-31 ENCOUNTER — Other Ambulatory Visit: Payer: Self-pay

## 2022-07-31 ENCOUNTER — Ambulatory Visit (HOSPITAL_BASED_OUTPATIENT_CLINIC_OR_DEPARTMENT_OTHER): Payer: Medicare Other | Admitting: Anesthesiology

## 2022-07-31 ENCOUNTER — Encounter (HOSPITAL_BASED_OUTPATIENT_CLINIC_OR_DEPARTMENT_OTHER)
Admission: RE | Disposition: A | Discharge status: Home / Self Care | Payer: Self-pay | Source: Home / Self Care | Attending: Surgery

## 2022-07-31 ENCOUNTER — Encounter (HOSPITAL_BASED_OUTPATIENT_CLINIC_OR_DEPARTMENT_OTHER): Payer: Self-pay | Admitting: Surgery

## 2022-07-31 ENCOUNTER — Ambulatory Visit (HOSPITAL_BASED_OUTPATIENT_CLINIC_OR_DEPARTMENT_OTHER)
Admission: RE | Admit: 2022-07-31 | Discharge: 2022-07-31 | Disposition: A | Discharge status: Home / Self Care | Payer: Medicare Other | Attending: Surgery | Admitting: Surgery

## 2022-07-31 DIAGNOSIS — Z09 Encounter for follow-up examination after completed treatment for conditions other than malignant neoplasm: Secondary | ICD-10-CM | POA: Insufficient documentation

## 2022-07-31 DIAGNOSIS — E7800 Pure hypercholesterolemia, unspecified: Secondary | ICD-10-CM | POA: Diagnosis not present

## 2022-07-31 DIAGNOSIS — C50811 Malignant neoplasm of overlapping sites of right female breast: Secondary | ICD-10-CM | POA: Insufficient documentation

## 2022-07-31 DIAGNOSIS — Z8673 Personal history of transient ischemic attack (TIA), and cerebral infarction without residual deficits: Secondary | ICD-10-CM | POA: Diagnosis not present

## 2022-07-31 DIAGNOSIS — E1122 Type 2 diabetes mellitus with diabetic chronic kidney disease: Secondary | ICD-10-CM | POA: Insufficient documentation

## 2022-07-31 DIAGNOSIS — Z7984 Long term (current) use of oral hypoglycemic drugs: Secondary | ICD-10-CM | POA: Diagnosis not present

## 2022-07-31 DIAGNOSIS — E119 Type 2 diabetes mellitus without complications: Secondary | ICD-10-CM | POA: Diagnosis not present

## 2022-07-31 DIAGNOSIS — I10 Essential (primary) hypertension: Secondary | ICD-10-CM

## 2022-07-31 DIAGNOSIS — N189 Chronic kidney disease, unspecified: Secondary | ICD-10-CM | POA: Diagnosis not present

## 2022-07-31 DIAGNOSIS — N6011 Diffuse cystic mastopathy of right breast: Secondary | ICD-10-CM | POA: Diagnosis not present

## 2022-07-31 DIAGNOSIS — F419 Anxiety disorder, unspecified: Secondary | ICD-10-CM

## 2022-07-31 DIAGNOSIS — Z01818 Encounter for other preprocedural examination: Secondary | ICD-10-CM

## 2022-07-31 DIAGNOSIS — Z170 Estrogen receptor positive status [ER+]: Secondary | ICD-10-CM | POA: Insufficient documentation

## 2022-07-31 DIAGNOSIS — I471 Supraventricular tachycardia, unspecified: Secondary | ICD-10-CM | POA: Diagnosis not present

## 2022-07-31 DIAGNOSIS — C50911 Malignant neoplasm of unspecified site of right female breast: Secondary | ICD-10-CM

## 2022-07-31 DIAGNOSIS — I129 Hypertensive chronic kidney disease with stage 1 through stage 4 chronic kidney disease, or unspecified chronic kidney disease: Secondary | ICD-10-CM | POA: Insufficient documentation

## 2022-07-31 HISTORY — PX: BREAST LUMPECTOMY: SHX2

## 2022-07-31 LAB — GLUCOSE, CAPILLARY
Glucose-Capillary: 137 mg/dL — ABNORMAL HIGH (ref 70–99)
Glucose-Capillary: 157 mg/dL — ABNORMAL HIGH (ref 70–99)

## 2022-07-31 SURGERY — BREAST LUMPECTOMY
Anesthesia: General | Site: Breast | Laterality: Right

## 2022-07-31 MED ORDER — ACETAMINOPHEN 500 MG PO TABS
1000.0000 mg | ORAL_TABLET | ORAL | Status: AC
  Administered 2022-07-31: 500 mg via ORAL

## 2022-07-31 MED ORDER — LACTATED RINGERS IV SOLN: INTRAVENOUS | Status: DC

## 2022-07-31 MED ORDER — CEFAZOLIN SODIUM-DEXTROSE 2-4 GM/100ML-% IV SOLN
2.0000 g | INTRAVENOUS | Status: AC
  Administered 2022-07-31: 2 g via INTRAVENOUS

## 2022-07-31 MED ORDER — FENTANYL CITRATE (PF) 100 MCG/2ML IJ SOLN
INTRAMUSCULAR | Status: AC
  Filled 2022-07-31: qty 2

## 2022-07-31 MED ORDER — ACETAMINOPHEN 500 MG PO TABS
ORAL_TABLET | ORAL | Status: AC
  Filled 2022-07-31: qty 2

## 2022-07-31 MED ORDER — ONDANSETRON HCL 4 MG/2ML IJ SOLN
INTRAMUSCULAR | Status: DC | PRN
  Administered 2022-07-31: 4 mg via INTRAVENOUS

## 2022-07-31 MED ORDER — FENTANYL CITRATE (PF) 250 MCG/5ML IJ SOLN
INTRAMUSCULAR | Status: DC | PRN
  Administered 2022-07-31: 50 ug via INTRAVENOUS

## 2022-07-31 MED ORDER — OXYCODONE HCL 5 MG PO TABS: 5.0000 mg | ORAL_TABLET | Freq: Four times a day (QID) | ORAL | 0 refills | Status: DC | PRN

## 2022-07-31 MED ORDER — LIDOCAINE 2% (20 MG/ML) 5 ML SYRINGE
INTRAMUSCULAR | Status: DC | PRN
  Administered 2022-07-31: 60 mg via INTRAVENOUS

## 2022-07-31 MED ORDER — DEXAMETHASONE SODIUM PHOSPHATE 10 MG/ML IJ SOLN
INTRAMUSCULAR | Status: DC | PRN
  Administered 2022-07-31: 4 mg via INTRAVENOUS

## 2022-07-31 MED ORDER — ACETAMINOPHEN 500 MG PO TABS: 1000.0000 mg | ORAL_TABLET | Freq: Once | ORAL | Status: DC

## 2022-07-31 MED ORDER — CEFAZOLIN SODIUM-DEXTROSE 2-4 GM/100ML-% IV SOLN
INTRAVENOUS | Status: AC
  Filled 2022-07-31: qty 100

## 2022-07-31 MED ORDER — BUPIVACAINE-EPINEPHRINE (PF) 0.25% -1:200000 IJ SOLN
INTRAMUSCULAR | Status: DC | PRN
  Administered 2022-07-31: 20 mL

## 2022-07-31 MED ORDER — PROPOFOL 10 MG/ML IV BOLUS
INTRAVENOUS | Status: DC | PRN
  Administered 2022-07-31: 120 mg via INTRAVENOUS

## 2022-07-31 MED ORDER — FENTANYL CITRATE (PF) 100 MCG/2ML IJ SOLN
25.0000 ug | INTRAMUSCULAR | Status: DC | PRN
  Administered 2022-07-31 (×3): 50 ug via INTRAVENOUS

## 2022-07-31 SURGICAL SUPPLY — 38 items
ADH SKN CLS APL DERMABOND .7 (GAUZE/BANDAGES/DRESSINGS) ×1
ADH SKNCLS APL OCTYL .7 VIOL (GAUZE/BANDAGES/DRESSINGS) ×1
APL PRP STRL LF DISP 70% ISPRP (MISCELLANEOUS) ×1
BINDER BREAST XLRG (GAUZE/BANDAGES/DRESSINGS) IMPLANT
BLADE SURG 15 STRL LF DISP TIS (BLADE) ×1 IMPLANT
BLADE SURG 15 STRL SS (BLADE) ×1
CANISTER SUCT 1200ML W/VALVE (MISCELLANEOUS) IMPLANT
CHLORAPREP W/TINT 26 (MISCELLANEOUS) ×1 IMPLANT
COVER BACK TABLE 60X90IN (DRAPES) ×1 IMPLANT
COVER MAYO STAND STRL (DRAPES) ×1 IMPLANT
DERMABOND ADVANCED .7 DNX12 (GAUZE/BANDAGES/DRESSINGS) ×1 IMPLANT
DRAPE LAPAROTOMY 100X72 PEDS (DRAPES) ×1 IMPLANT
DRAPE UTILITY XL STRL (DRAPES) ×1 IMPLANT
ELECT REM PT RETURN 9FT ADLT (ELECTROSURGICAL) ×1
ELECTRODE REM PT RTRN 9FT ADLT (ELECTROSURGICAL) ×1 IMPLANT
GLOVE BIO SURGEON STRL SZ 6.5 (GLOVE) IMPLANT
GLOVE SURG SIGNA 7.5 PF LTX (GLOVE) ×1 IMPLANT
GOWN STRL REUS W/ TWL LRG LVL3 (GOWN DISPOSABLE) ×1 IMPLANT
GOWN STRL REUS W/ TWL XL LVL3 (GOWN DISPOSABLE) ×1 IMPLANT
GOWN STRL REUS W/TWL LRG LVL3 (GOWN DISPOSABLE) ×1
GOWN STRL REUS W/TWL XL LVL3 (GOWN DISPOSABLE) ×1
KIT MARKER MARGIN INK (KITS) ×1 IMPLANT
NDL HYPO 25X1 1.5 SAFETY (NEEDLE) ×1 IMPLANT
NEEDLE HYPO 25X1 1.5 SAFETY (NEEDLE) ×1 IMPLANT
NS IRRIG 1000ML POUR BTL (IV SOLUTION) ×1 IMPLANT
PACK BASIN DAY SURGERY FS (CUSTOM PROCEDURE TRAY) ×1 IMPLANT
PENCIL SMOKE EVACUATOR (MISCELLANEOUS) ×1 IMPLANT
SLEEVE SCD COMPRESS KNEE MED (STOCKING) IMPLANT
SPIKE FLUID TRANSFER (MISCELLANEOUS) IMPLANT
SPONGE T-LAP 4X18 ~~LOC~~+RFID (SPONGE) ×1 IMPLANT
SUT MNCRL AB 4-0 PS2 18 (SUTURE) ×1 IMPLANT
SUT SILK 2 0 SH (SUTURE) ×1 IMPLANT
SUT VIC AB 3-0 SH 27 (SUTURE) ×1
SUT VIC AB 3-0 SH 27X BRD (SUTURE) ×1 IMPLANT
SYR CONTROL 10ML LL (SYRINGE) ×1 IMPLANT
TOWEL GREEN STERILE FF (TOWEL DISPOSABLE) ×1 IMPLANT
TUBE CONNECTING 20X1/4 (TUBING) IMPLANT
YANKAUER SUCT BULB TIP NO VENT (SUCTIONS) IMPLANT

## 2022-07-31 NOTE — Anesthesia Procedure Notes (Signed)
Procedure Name: LMA Insertion Date/Time: 07/31/2022 10:07 AM  Performed by: Demetrio Lapping, CRNAPre-anesthesia Checklist: Patient identified, Emergency Drugs available, Suction available and Patient being monitored Patient Re-evaluated:Patient Re-evaluated prior to induction Oxygen Delivery Method: Circle System Utilized Preoxygenation: Pre-oxygenation with 100% oxygen Induction Type: IV induction Ventilation: Mask ventilation without difficulty LMA: LMA inserted LMA Size: 4.0 Number of attempts: 1 Airway Equipment and Method: Bite block Placement Confirmation: positive ETCO2 Tube secured with: Tape Dental Injury: Teeth and Oropharynx as per pre-operative assessment

## 2022-07-31 NOTE — Transfer of Care (Signed)
Immediate Anesthesia Transfer of Care Note  Patient: [name]  Procedure(s) Performed: RIGHT BREAST LUMPECTOMY (Right: Breast)  Patient Location: PACU  Anesthesia Type:General  Level of Consciousness: drowsy  Airway & Oxygen Therapy: Patient Spontanous Breathing and Patient connected to face mask oxygen  Post-op Assessment: Report given to RN and Post -op Vital signs reviewed and stable  Post vital signs: Reviewed and stable  Last Vitals:  Vitals Value Taken Time  BP 132/64   Temp    Pulse 63   Resp 14   SpO2 100     Last Pain:  Vitals:   07/31/22 0837  TempSrc: Oral  PainSc: 0-No pain      Patients Stated Pain Goal: 4 (07/31/22 0837)  Complications: No notable events documented.

## 2022-07-31 NOTE — Anesthesia Postprocedure Evaluation (Signed)
Anesthesia Post Note  Patient: Brooklee N Bettcher  Procedure(s) Performed: RIGHT BREAST LUMPECTOMY (Right: Breast)     Patient location during evaluation: PACU Anesthesia Type: General Level of consciousness: awake and alert Pain management: pain level controlled Vital Signs Assessment: post-procedure vital signs reviewed and stable Respiratory status: spontaneous breathing, nonlabored ventilation, respiratory function stable and patient connected to nasal cannula oxygen Cardiovascular status: blood pressure returned to baseline and stable Postop Assessment: no apparent nausea or vomiting Anesthetic complications: no  No notable events documented.  Last Vitals:  Vitals:   07/31/22 1145 07/31/22 1206  BP: (!) 143/65 (!) 144/58  Pulse: 68 68  Resp: 11 13  Temp:  36.6 C  SpO2: 96% 96%    Last Pain:  Vitals:   07/31/22 1206  TempSrc:   PainSc: 0-No pain                 Malayia Spizzirri L Briona Korpela

## 2022-07-31 NOTE — Interval H&P Note (Signed)
History and Physical Interval Note:no change in H and P  07/31/2022 8:17 AM  [name]  has presented today for surgery, with the diagnosis of RIGHT BREAST CANCER.  The various methods of treatment have been discussed with the patient and family. After consideration of risks, benefits and other options for treatment, the patient has consented to  Procedure(s): RIGHT BREAST LUMPECTOMY (Right) as a surgical intervention.  The patient's history has been reviewed, patient examined, no change in status, stable for surgery.  I have reviewed the patient's chart and labs.  Questions were answered to the patient's satisfaction.     Abigail Miyamoto

## 2022-07-31 NOTE — Op Note (Signed)
RIGHT BREAST LUMPECTOMY  Procedure Note  Aalayah N Galindez 07/31/2022   Pre-op Diagnosis: RIGHT BREAST CANCER     Post-op Diagnosis: same  Procedure(s): RIGHT BREAST LUMPECTOMY  Surgeon(s): Abigail Miyamoto, MD Carollee Herter, MD [facility] Resident  Anesthesia: General  Staff:  Circulator: Maryan Rued, RN Scrub Person: Wardell Heath, CST  Estimated Blood Loss: Minimal               Specimens: sent to pathology  Indications: This is an 82 year old female was found to have a right breast mass on screening mammography.  A biopsy showed invasive ductal carcinoma.  The mass was palpable examination.  It was at the 9 o'clock position 3 cm from the nipple.  The decision was made to proceed with a right breast lumpectomy  Procedure: The patient was brought to the operating room and identified as a correct patient.  She was placed supine on the operating table and general anesthesia was induced.  Her right breast was prepped and draped in usual sterile fashion.  We made an elliptical incision in the patient's lateral right breast at the 9 o'clock position adjacent to the nipple areolar complex.  We took this down into the breast tissue circumferentially with electrocautery.  The mass was palpable.  There was extensive calcifications around in the breast.  We performed a lumpectomy with the cautery going down to near the chest wall and slightly underneath the nipple areolar complex.  We then completed lumpectomy staying underneath the palpable mass.  Once the specimen was removed we marked all margins with paint.  An x-ray was performed and specimen confirming the previous biopsy clip was in the specimen.  The lumpectomy specimen was then sent to pathology for evaluation.  We achieved hemostasis with the cautery.  We anesthetized the incision further with Marcaine.  We then closed the subcutaneous tissue with interrupted 3-0 Vicryl sutures and closed the skin with a running 4-0 Monocryl.   Dermabond was then applied.  The patient tolerated procedure well.  All the counts were correct at the end of the procedure.  The patient was then extubated in the operating room and taken in a stable condition to the recovery room.          Abigail Miyamoto   Date: 07/31/2022  Time: 10:49 AM

## 2022-07-31 NOTE — Discharge Instructions (Addendum)
[facility] Office Phone Number [phone]  BREAST BIOPSY/ PARTIAL MASTECTOMY: POST OP INSTRUCTIONS  Always review your discharge instruction sheet given to you by the facility where your surgery was performed.  IF YOU HAVE DISABILITY OR FAMILY LEAVE FORMS, YOU MUST BRING THEM TO THE OFFICE FOR PROCESSING.  DO NOT GIVE THEM TO YOUR DOCTOR.  A prescription for pain medication may be given to you upon discharge.  Take your pain medication as prescribed, if needed.  If narcotic pain medicine is not needed, then you may take acetaminophen (Tylenol) or ibuprofen (Advil) as needed. Take your usually prescribed medications unless otherwise directed If you need a refill on your pain medication, please contact your pharmacy.  They will contact our office to request authorization.  Prescriptions will not be filled after 5pm or on week-ends. You should eat very light the first 24 hours after surgery, such as soup, crackers, pudding, etc.  Resume your normal diet the day after surgery. Most patients will experience some swelling and bruising in the breast.  Ice packs and a good support bra will help.  Swelling and bruising can take several days to resolve.  It is common to experience some constipation if taking pain medication after surgery.  Increasing fluid intake and taking a stool softener will usually help or prevent this problem from occurring.  A mild laxative (Milk of Magnesia or Miralax) should be taken according to package directions if there are no bowel movements after 48 hours. Unless discharge instructions indicate otherwise, you may remove your bandages 24-48 hours after surgery, and you may shower at that time.  You may have steri-strips (small skin tapes) in place directly over the incision.  These strips should be left on the skin for 7-10 days.  If your surgeon used skin glue on the incision, you may shower in 24 hours.  The glue will flake off over the next 2-3 weeks.  Any  sutures or staples will be removed at the office during your follow-up visit. ACTIVITIES:  You may resume regular daily activities (gradually increasing) beginning the next day.  Wearing a good support bra or sports bra minimizes pain and swelling.  You may have sexual intercourse when it is comfortable. You may drive when you no longer are taking prescription pain medication, you can comfortably wear a seatbelt, and you can safely maneuver your car and apply brakes. RETURN TO WORK:  ______________________________________________________________________________________ [name] should see your doctor in the office for a follow-up appointment approximately two weeks after your surgery.  Your doctor's nurse will typically make your follow-up appointment when she calls you with your pathology report.  Expect your pathology report 2-3 business days after your surgery.  You may call to check if you do not hear from [place] after three days. OTHER INSTRUCTIONS: ___YOU MAY REMOVE THE BINDER AND SHOWER STARTING TOMORROW THEN WEAR WHAT EVER MAKES YOU THE MOST COMFORTABLE ICE PACK, TYLENOL, AND IBUPROFEN ALSO FOR PAIN NO VIGOROUS ACTIVITY FOR ONE WEEK ____________________________________________________________________________________________ _____________________________________________________________________________________________________________________________________ _____________________________________________________________________________________________________________________________________ _____________________________________________________________________________________________________________________________________  WHEN TO CALL YOUR DOCTOR: Fever over 101.0 Nausea and/or vomiting. Extreme swelling or bruising. Continued bleeding from incision. Increased pain, redness, or drainage from the incision.  The clinic staff is available to answer your questions during regular business hours.  Please  don't hesitate to call and ask to speak to one of the nurses for clinical concerns.  If you have a medical emergency, go to the nearest emergency room or call 911.  A surgeon from [facility]  is always on call at the hospital.  For further questions, please visit [email or web address]    Post Anesthesia Home Care Instructions  Activity: Get plenty of rest for the remainder of the day. A responsible individual must stay with you for 24 hours following the procedure.  For the next 24 hours, DO NOT: -Drive a car -Advertising copywriter -Drink alcoholic beverages -Take any medication unless instructed by your physician -Make any legal decisions or sign important papers.  Meals: Start with liquid foods such as gelatin or soup. Progress to regular foods as tolerated. Avoid greasy, spicy, heavy foods. If nausea and/or vomiting occur, drink only clear liquids until the nausea and/or vomiting subsides. Call your physician if vomiting continues.  Special Instructions/Symptoms: Your throat may feel dry or sore from the anesthesia or the breathing tube placed in your throat during surgery. If this causes discomfort, gargle with warm salt water. The discomfort should disappear within 24 hours.  If you had a scopolamine patch placed behind your ear for the management of post- operative nausea and/or vomiting:  1. The medication in the patch is effective for 72 hours, after which it should be removed.  Wrap patch in a tissue and discard in the trash. Wash hands thoroughly with soap and water. 2. You may remove the patch earlier than 72 hours if you experience unpleasant side effects which may include dry mouth, dizziness or visual disturbances. 3. Avoid touching the patch. Wash your hands with soap and water after contact with the patch.    Next dose  of tylenol due at 2:44pm

## 2022-07-31 NOTE — Anesthesia Preprocedure Evaluation (Addendum)
Anesthesia Evaluation  Patient identified by MRN, date of birth, ID band Patient awake    Reviewed: Allergy & Precautions, NPO status , Patient's Chart, lab work & pertinent test results, reviewed documented beta blocker date and time   History of Anesthesia Complications (+) PONV and history of anesthetic complications  Airway Mallampati: II  TM Distance: >3 FB Neck ROM: Full    Dental no notable dental hx. (+) Teeth Intact, Dental Advisory Given   Pulmonary neg pulmonary ROS   Pulmonary exam normal breath sounds clear to auscultation       Cardiovascular hypertension, Pt. on home beta blockers and Pt. on medications Normal cardiovascular exam+ dysrhythmias Supra Ventricular Tachycardia  Rhythm:Regular Rate:Normal  TTE 2019 - Normal LVEF.    Relaxation abnormalities.    TRace TR, MR.     Neuro/Psych  Headaches PSYCHIATRIC DISORDERS Anxiety     TIACVA (right sided numbness), Residual Symptoms    GI/Hepatic Neg liver ROS,GERD  ,,  Endo/Other  diabetes, Type 2, Oral Hypoglycemic Agents    Renal/GU negative Renal ROS  negative genitourinary   Musculoskeletal  (+) Arthritis ,    Abdominal   Peds  Hematology negative hematology ROS (+)   Anesthesia Other Findings   Reproductive/Obstetrics                             Anesthesia Physical Anesthesia Plan  ASA: 3  Anesthesia Plan: General   Post-op Pain Management: Tylenol PO (pre-op)*   Induction: Intravenous  PONV Risk Score and Plan: 4 or greater and Ondansetron, Dexamethasone and Treatment may vary due to age or medical condition  Airway Management Planned: LMA  Additional Equipment:   Intra-op Plan:   Post-operative Plan: Extubation in OR  Informed Consent: I have reviewed the patients History and Physical, chart, labs and discussed the procedure including the risks, benefits and alternatives for the proposed anesthesia with  the patient or authorized representative who has indicated his/her understanding and acceptance.     Dental advisory given  Plan Discussed with: CRNA  Anesthesia Plan Comments:        Anesthesia Quick Evaluation

## 2022-08-01 ENCOUNTER — Encounter (HOSPITAL_BASED_OUTPATIENT_CLINIC_OR_DEPARTMENT_OTHER): Payer: Self-pay | Admitting: Surgery

## 2022-08-01 ENCOUNTER — Encounter: Payer: Self-pay | Admitting: *Deleted

## 2022-08-01 NOTE — Progress Notes (Signed)
Patient had her lumpectomy 07/31/2022. Will follow for path results.   Oncology Nurse Navigator Documentation     08/01/2022    8:30 AM  Oncology Nurse Navigator Flowsheets  Phase of Treatment Surgery  Surgery Actual Start Date: 07/31/2022  Navigator Follow Up Date: 08/03/2022  Navigator Follow Up Reason: Pathology  Navigator Location [facility]  Navigator Encounter Type Appt/Treatment Plan Review  Treatment Initiated Date 07/31/2022  Patient Visit Type MedOnc  Treatment Phase Active Tx  Barriers/Navigation Needs Coordination of Care  Interventions None Required  Acuity Level 2-Minimal Needs (1-2 Barriers Identified)  Support Groups/Services Friends and Family  Time Spent with Patient 15

## 2022-08-02 LAB — SURGICAL PATHOLOGY

## 2022-08-03 ENCOUNTER — Encounter: Payer: Self-pay | Admitting: *Deleted

## 2022-08-03 NOTE — Progress Notes (Signed)
Per Dr Myna Hidalgo, request for Oncotype DX Breast Recurrence Score sent on specimen [id number] DOS 07/31/2022.  Oncology Nurse Navigator Documentation     08/03/2022    8:00 AM  Oncology Nurse Navigator Flowsheets  Navigator Follow Up Date: 08/23/2022  Navigator Follow Up Reason: Follow-up Appointment  Navigator Location [facility]  Navigator Encounter Type Molecular Studies  Patient Visit Type MedOnc  Treatment Phase Active Tx  Barriers/Navigation Needs Coordination of Care  Interventions Coordination of Care  Acuity Level 2-Minimal Needs (1-2 Barriers Identified)  Coordination of Care Pathology  Support Groups/Services Friends and Family  Time Spent with Patient 30

## 2022-08-08 ENCOUNTER — Ambulatory Visit: Payer: Self-pay

## 2022-08-08 NOTE — Patient Instructions (Addendum)
Visit Information  Thank you for taking time to visit with me today. Please don't hesitate to contact me if I can be of assistance to you.   Following are the goals we discussed today:  Continue to take medications as prescribed Continue to attend provider visits as scheduled Contact your provider with health questions or concerns as needed   Our next appointment is by telephone on 09/13/22 at 11:30  Please call the care guide team at [phone] if you need to cancel or reschedule your appointment.   If you are experiencing a Mental Health or Behavioral Health Crisis or need someone to talk to, please call the Suicide and Crisis Lifeline: 63  Kathyrn Sheriff, RN, MSN, BSN, CCM [facility] Care Coordinator [phone]

## 2022-08-08 NOTE — Patient Outreach (Signed)
  Care Coordination   Follow Up Visit Note   08/08/2022 Name: DEAIRAH ARTUSO MRN: [id number] DOB: [birth year]  Zariya N Minix is a 82 y.o. year old female who sees [name], MD for primary care. I spoke with  Chrisa N Lirette by phone today.  What matters to the patients health and wellness today?  Ms. Campillo reports she is doing good. Lumpectomy to right breast on 07/31/22. She denies any questions or concerns at this time.  Goals Addressed             This Visit's Progress    assist with health management       Interventions Today    Flowsheet Row Most Recent Value  Chronic Disease   Chronic disease during today's visit Other  [right breast ca-post right breast lumpectomy on 07/31/22]  General Interventions   General Interventions Discussed/Reviewed General Interventions Reviewed, Doctor Visits  Doctor Visits Discussed/Reviewed Doctor Visits Discussed, Doctor Visits Reviewed  [reviewed discharge instructions from procedure on 07/31/22. patient confirms she has a copy of her discharge instructions and reviewed the instructions. reviewed upcoming/scheduled appointments]  Exercise Interventions   Exercise Discussed/Reviewed Physical Activity  Education Interventions   Education Provided Provided Education  Provided Verbal Education On When to see the doctor, Medication  Nutrition Interventions   Nutrition Discussed/Reviewed Nutrition Reviewed  [reports eating well]  Pharmacy Interventions   Pharmacy Dicussed/Reviewed Pharmacy Topics Reviewed            SDOH assessments and interventions completed:  No  Care Coordination Interventions:  Yes, provided   Follow up plan: Follow up call scheduled for 09/13/22    Encounter Outcome:  Pt. Visit Completed   Kathyrn Sheriff, RN, MSN, BSN, CCM [facility] Care Coordinator [phone]

## 2022-08-14 ENCOUNTER — Telehealth: Payer: Self-pay | Admitting: Licensed Clinical Social Worker

## 2022-08-14 ENCOUNTER — Ambulatory Visit: Payer: Self-pay | Admitting: Licensed Clinical Social Worker

## 2022-08-14 NOTE — Patient Outreach (Signed)
  Care Coordination   08/14/2022 Name: KASHANTI OLSHANSKY MRN: [id number] DOB: 1941/01/23   Care Coordination Outreach Attempts:  An unsuccessful telephone outreach was attempted for a scheduled appointment today.  Follow Up Plan:  Additional outreach attempts will be made to offer the patient care coordination information and services.   Encounter Outcome:  No Answer   Care Coordination Interventions:  No, not indicated    Sammuel Hines, LCSW Social Work Care Coordination  [facility] [employer] [employer] [phone]

## 2022-08-14 NOTE — Patient Instructions (Signed)
Social Work Visit Information  Thank you for taking time to visit with me today. Please don't hesitate to contact me if I can be of assistance to you.   Following are the goals we discussed today:   Goals Addressed             This Visit's Progress    COMPLETED: Care Coordination Activies No follow up required       Activities and task to complete in order to accomplish goals.   Call your insurance provider for more information about your Enhanced Benefits (transportation and support when you get out of the hospital) We discussed transportation, you stated the Cancer Center will provide transportation for you to cancer related appointments Keep all upcoming appointment discussed today Continue with compliance of taking medication prescribed by Doctor         Patient does not require continued follow-up by social work. They will contact the office if needed  Please call the care guide team at [phone] if you need to cancel or reschedule your appointment.   If you or anyone you know are experiencing a Mental Health or Behavioral Health Crisis or need someone to talk to, please call the Suicide and Crisis Lifeline: 988 call the [place] National Suicide Prevention Lifeline: (458) 201-5308 or TTY: 575-705-1899 TTY 717-760-8124) to talk to a trained counselor call 1-800-273-TALK (toll free, 24 hour hotline) go to [facility] [address] [place] [phone])   Patient verbalizes understanding of instructions and care plan provided today and agrees to view in MyChart. Active MyChart status and patient understanding of how to access instructions and care plan via MyChart confirmed with patient.       Sammuel Hines, LCSW Social Work Care Coordination  [facility] [employer] [phone]

## 2022-08-14 NOTE — Patient Instructions (Signed)
  I am sorry you were unable to keep your phone appointment today.   Please call me to reschedule  [name], LCSW Social Work Care Coordination  [phone]  

## 2022-08-14 NOTE — Patient Outreach (Signed)
  Care Coordination  Follow Up Visit Note   08/14/2022 Name: SHUREE CHIOU MRN: [id number] DOB: 1940/12/16  Jametta N Pavlicek is a 82 y.o. year old female who sees [name], MD for primary care. I spoke with  Natale N Noller by phone today.  What matters to the patients health and wellness today?  Managing her health  Patient reports she is doing well.  Is able to drive short distances.  She will call the cancer center if additional transportation support in needed to cancer related appointments.  Patient has connected with LCSW at the [facility] for resources and support.   Goals Addressed             This Visit's Progress    COMPLETED: Care Coordination Activies No follow up required       Activities and task to complete in order to accomplish goals.   Call your insurance provider for more information about your Enhanced Benefits (transportation and support when you get out of the hospital) We discussed transportation, you stated the Cancer Center will provide transportation for you to cancer related appointments Keep all upcoming appointment discussed today Continue with compliance of taking medication prescribed by Doctor        SDOH assessments and interventions completed:  No   Care Coordination Interventions:  Yes, provided  Interventions Today    Flowsheet Row Most Recent Value  Chronic Disease   Chronic disease during today's visit Hypertension (HTN), Diabetes  General Interventions   General Interventions Discussed/Reviewed General Interventions Reviewed, [employer] listen,  problems solving, solution focused]       Follow up plan: No further intervention required.   Encounter Outcome:  Pt. Visit Completed   Sammuel Hines, LCSW Social Work Care Coordination  [facility] [employer] [phone]

## 2022-08-17 ENCOUNTER — Telehealth: Payer: Self-pay | Admitting: Hematology & Oncology

## 2022-08-17 NOTE — Telephone Encounter (Signed)
Patient called to ask me what the "next step" in the process was, I explained she just needed to attend her upcoming appointments and the doctor would guide her from there, I checked to see how she felt after surgery and made sure all was well. I made sure nothing was else was needed before we hung up.

## 2022-08-19 DIAGNOSIS — Z170 Estrogen receptor positive status [ER+]: Secondary | ICD-10-CM | POA: Diagnosis not present

## 2022-08-19 DIAGNOSIS — C50411 Malignant neoplasm of upper-outer quadrant of right female breast: Secondary | ICD-10-CM | POA: Diagnosis not present

## 2022-08-21 ENCOUNTER — Encounter (HOSPITAL_COMMUNITY): Payer: Self-pay

## 2022-08-21 ENCOUNTER — Telehealth: Payer: Self-pay | Admitting: *Deleted

## 2022-08-21 NOTE — Telephone Encounter (Signed)
-----   Message from Josph Macho sent at 08/21/2022  1:42 PM EDT ----- Call - the Oncotype score is only 12!!!!  This is fantastic.   No need for chemo!!!!  [name]

## 2022-08-23 ENCOUNTER — Inpatient Hospital Stay: Payer: Medicare Other

## 2022-08-23 ENCOUNTER — Inpatient Hospital Stay: Payer: Medicare Other | Admitting: Hematology & Oncology

## 2022-08-24 ENCOUNTER — Inpatient Hospital Stay: Payer: Medicare Other | Attending: Hematology & Oncology | Admitting: Dietician

## 2022-08-24 DIAGNOSIS — C50911 Malignant neoplasm of unspecified site of right female breast: Secondary | ICD-10-CM | POA: Insufficient documentation

## 2022-08-24 DIAGNOSIS — Z170 Estrogen receptor positive status [ER+]: Secondary | ICD-10-CM | POA: Insufficient documentation

## 2022-08-24 NOTE — Progress Notes (Signed)
Nutrition Follow-up:  Reached out to patient to follow up on information that I mailed her and progress with MD.  Her follow up with oncologist was cancelled, but notes in chart reflect that she will note be receiving chemo.  Patient picked up home telephone and reports she is trying to avoid many of the foods that were listed in sample menus that were mailed.  He body doesn't tolerate peanut butter, she avoids pasta and worries about bananas.  She reports still having pain in breast after surgery, and in concerned with nerve damage as a side effect of Metformin.  Medication:  taking MVI but not everyday,  takes Ca+ Vit D on days he doesn't take the MVI  Anthropometrics:  stable within 4# range for past couple of years  Height: 61" Weight:  07/31/22  164# 07/17/22  166# UBW: 165-169# BMI: 31.03   Estimated Energy Needs  Kcals: 1610-9604 Protein: 90-113 Fluid: 2.5L   NUTRITION DIAGNOSIS: none at this time, intervention below addressed patient's questions and concerns   INTERVENTION:  Encouraged speaking with PCP about her concerns with Metformin. Encouraged high protein foods at each meal/snack to promote healing. Encouraged daily MVI to promote healing.   MONITORING, EVALUATION, GOAL: weight trends, nutrition impact symptoms, PO intake, labs   Next Visit: PRN at patient or provider request  Gennaro Africa, RDN, LDN Registered Dietitian, [facility] Part Time Remote (Usual office hours: Tuesday-Thursday) Mobile: [phone]

## 2022-08-29 ENCOUNTER — Encounter: Payer: Self-pay | Admitting: Hematology & Oncology

## 2022-08-29 ENCOUNTER — Encounter: Payer: Self-pay | Admitting: *Deleted

## 2022-08-29 ENCOUNTER — Inpatient Hospital Stay: Payer: Medicare Other | Admitting: Hematology & Oncology

## 2022-08-29 ENCOUNTER — Inpatient Hospital Stay: Payer: Medicare Other

## 2022-08-29 VITALS — BP 143/57 | HR 64 | Temp 98.2°F | Resp 20 | Ht 61.0 in | Wt 165.0 lb

## 2022-08-29 DIAGNOSIS — C50411 Malignant neoplasm of upper-outer quadrant of right female breast: Secondary | ICD-10-CM | POA: Diagnosis not present

## 2022-08-29 DIAGNOSIS — K909 Intestinal malabsorption, unspecified: Secondary | ICD-10-CM

## 2022-08-29 DIAGNOSIS — Z170 Estrogen receptor positive status [ER+]: Secondary | ICD-10-CM | POA: Diagnosis not present

## 2022-08-29 DIAGNOSIS — C50911 Malignant neoplasm of unspecified site of right female breast: Secondary | ICD-10-CM | POA: Diagnosis not present

## 2022-08-29 LAB — CBC WITH DIFFERENTIAL (CANCER CENTER ONLY)
Abs Immature Granulocytes: 0.03 10*3/uL (ref 0.00–0.07)
Basophils Absolute: 0 10*3/uL (ref 0.0–0.1)
Basophils Relative: 1 %
Eosinophils Absolute: 0.1 10*3/uL (ref 0.0–0.5)
Eosinophils Relative: 1 %
HCT: 38.3 % (ref 36.0–46.0)
Hemoglobin: 12.7 g/dL (ref 12.0–15.0)
Immature Granulocytes: 0 %
Lymphocytes Relative: 33 %
Lymphs Abs: 2.8 10*3/uL (ref 0.7–4.0)
MCH: 28.2 pg (ref 26.0–34.0)
MCHC: 33.2 g/dL (ref 30.0–36.0)
MCV: 85.1 fL (ref 80.0–100.0)
Monocytes Absolute: 0.8 10*3/uL (ref 0.1–1.0)
Monocytes Relative: 9 %
Neutro Abs: 4.7 10*3/uL (ref 1.7–7.7)
Neutrophils Relative %: 56 %
Platelet Count: 222 10*3/uL (ref 150–400)
RBC: 4.5 MIL/uL (ref 3.87–5.11)
RDW: 11.9 % (ref 11.5–15.5)
WBC Count: 8.4 10*3/uL (ref 4.0–10.5)
nRBC: 0 % (ref 0.0–0.2)

## 2022-08-29 LAB — CMP (CANCER CENTER ONLY)
ALT: 21 U/L (ref 0–44)
AST: 22 U/L (ref 15–41)
Albumin: 4.8 g/dL (ref 3.5–5.0)
Alkaline Phosphatase: 59 U/L (ref 38–126)
Anion gap: 12 (ref 5–15)
BUN: 21 mg/dL (ref 8–23)
CO2: 24 mmol/L (ref 22–32)
Calcium: 10.3 mg/dL (ref 8.9–10.3)
Chloride: 96 mmol/L — ABNORMAL LOW (ref 98–111)
Creatinine: 0.84 mg/dL (ref 0.44–1.00)
GFR, Estimated: >60 mL/min (ref >60)
Glucose, Bld: 161 mg/dL — ABNORMAL HIGH (ref 70–99)
Potassium: 4.1 mmol/L (ref 3.5–5.1)
Sodium: 132 mmol/L — ABNORMAL LOW (ref 135–145)
Total Bilirubin: 0.7 mg/dL (ref 0.3–1.2)
Total Protein: 7.6 g/dL (ref 6.5–8.1)

## 2022-08-29 LAB — RETICULOCYTES
Immature Retic Fract: 4.3 % (ref 2.3–15.9)
RBC.: 4.48 MIL/uL (ref 3.87–5.11)
Retic Count, Absolute: 59.6 10*3/uL (ref 19.0–186.0)
Retic Ct Pct: 1.3 % (ref 0.4–3.1)

## 2022-08-29 LAB — FERRITIN: Ferritin: 257 ng/mL (ref 11–307)

## 2022-08-29 LAB — LACTATE DEHYDROGENASE: LDH: 146 U/L (ref 98–192)

## 2022-08-29 NOTE — Progress Notes (Unsigned)
Patient recovering well from surgery. She will follow back up with radiation oncology to discuss possible rxt. Once complete she will follow back up with [place] for AI.   Oncology Nurse Navigator Documentation     08/29/2022   12:00 PM  Oncology Nurse Navigator Flowsheets  Navigator Follow Up Date: 10/12/2022  Navigator Follow Up Reason: Follow-up Appointment  Navigator Location [facility]  Navigator Encounter Type Follow-up Appt  Patient Visit Type MedOnc  Treatment Phase Active Tx  Barriers/Navigation Needs Coordination of Care  Interventions Referrals  Acuity Level 2-Minimal Needs (1-2 Barriers Identified)  Referrals Radiation Oncology  Support Groups/Services Friends and Family  Time Spent with Patient 15

## 2022-08-29 NOTE — Progress Notes (Signed)
Hematology and Oncology Follow Up Visit  [name] [id number] 03/12/1940 82 y.o. 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  She is looking quite good.  She had a lobectomy on 07/31/2022.  The pathology report ([id number]) showed a 1.8 x 1.8 x 1.5 cm well-differentiated ductal carcinoma.  All margins were negative.  There is no lymphovascular space invasion.  She did not have a sentinel node that was biopsied.  Her Oncotype score is only 12.  As such, she has a stage I breast cancer.  I think that she will not need chemotherapy.  She will need radiation therapy along with antiestrogen therapy.  She is doing well.  She has little bit of soreness over on the right side.  She has no swelling of the right arm.  There is no cough or shortness of breath.  She has had no nausea or vomiting.  There is no change in bowel or bladder habits.  She has had no fever.  Overall, I would say performance status is probably ECOG 1.    Medications:  Current Outpatient Medications:    acetaminophen (TYLENOL) 325 MG tablet, Take 2 tablets (650 mg total) by mouth every 6 (six) hours as needed for up to 30 doses for mild pain or moderate pain., Disp: 30 tablet, Rfl: 0   amLODipine (NORVASC) 5 MG tablet, TAKE 1 TABLET BY MOUTH DAILY, Disp: 100 tablet, Rfl: 2   aspirin EC 81 MG tablet, Take 81 mg by mouth daily. Swallow whole., Disp: , Rfl:    Blood Glucose Monitoring Suppl (ONE TOUCH ULTRA 2) w/Device KIT, USE TO CHECK BLOOD SUGAR AS DIRECTED, Disp: 1 kit, Rfl: 0   calcium-vitamin D (OSCAL WITH D) 500-5 MG-MCG tablet, Take 1 tablet by mouth daily., Disp: , Rfl:    cholecalciferol (VITAMIN D) 1000 UNITS tablet, Take 1,000 Units by mouth daily., Disp: , Rfl:    Lancets (ONETOUCH ULTRASOFT) lancets,  USE AS DIRECTED 3 TIMES  DAILY, Disp: 300 each, Rfl: 3   losartan (COZAAR) 50 MG tablet, TAKE 1 TABLET BY MOUTH TWICE  DAILY, Disp: 200 tablet, Rfl: 2   metFORMIN (GLUCOPHAGE-XR) 500 MG 24 hr tablet, Take 1 tablet (500 mg total) by mouth 2 (two) times daily. TAKE 1 TABLET BY MOUTH IN THE  MORNING AND 1 TABLET AT BEDTIME Strength: 500 mg, Disp: 180 tablet, Rfl: 2   metoCLOPramide (REGLAN) 5 MG tablet, TAKE 1 TABLET BY MOUTH TWICE  DAILY AS NEEDED, Disp: 180 tablet, Rfl: 0   metoprolol tartrate (LOPRESSOR) 50 MG tablet, Take 1 tablet (50 mg total) by mouth 2 (two) times daily., Disp: 180 tablet, Rfl: 3   NEXIUM 40 MG capsule, Take 1 capsule (40 mg total) by mouth daily before breakfast., Disp: 90 capsule, Rfl: 3   ONETOUCH ULTRA test strip, CHECK BLOOD SUGAR 3 TIMES DAILY  OR AS NEEDED, Disp: 300 strip, Rfl: 2   potassium chloride (KLOR-CON M) 10 MEQ tablet, TAKE 1 TABLET BY MOUTH DAILY, Disp: 100 tablet, Rfl: 2   rosuvastatin (CRESTOR) 20 MG tablet, TAKE 1 TABLET BY MOUTH DAILY, Disp: 100 tablet, Rfl: 2   thiamine (VITAMIN B-1) 100 MG tablet, Take 100 mg by mouth every other day., Disp: , Rfl:    vitamin E 180 MG (400  UNITS) capsule, Take 400 Units by mouth daily., Disp: , Rfl:    betamethasone valerate ointment (VALISONE) 0.1 %, Apply a small amount to affected area topically every other day. (Patient not taking: Reported on 07/17/2022), Disp: 45 g, Rfl: 1   furosemide (LASIX) 20 MG tablet, TAKE 1 TABLET BY MOUTH DAILY AS  NEEDED (Patient not taking: Reported on 08/29/2022), Disp: 100 tablet, Rfl: 2   Multiple Vitamin (MULTIVITAMIN ADULT PO), Take by mouth. (Patient not taking: Reported on 08/29/2022), Disp: , Rfl:    ondansetron (ZOFRAN) 4 MG tablet, Take 4 mg by mouth every 4 (four) hours as needed. For nausea (Patient not taking: Reported on 07/17/2022), Disp: , Rfl:    oxyCODONE (OXY IR/ROXICODONE) 5 MG immediate release tablet, Take 1 tablet (5 mg total) by mouth every 6 (six) hours as needed for  moderate pain, severe pain or breakthrough pain. (Patient not taking: Reported on 08/29/2022), Disp: 15 tablet, Rfl: 0  Allergies:  Allergies  Allergen Reactions   Tramadol Other (See Comments)    Dizziness     Past Medical History, Surgical history, Social history, and Family History were reviewed and updated.  Review of Systems: Review of Systems  Constitutional: Negative.   HENT:  Negative.    Eyes: Negative.   Respiratory: Negative.    Cardiovascular: Negative.   Gastrointestinal: Negative.   Endocrine: Negative.   Genitourinary: Negative.    Musculoskeletal: Negative.   Skin: Negative.   Neurological: Negative.   Hematological: Negative.   Psychiatric/Behavioral: Negative.      Physical Exam:  height is 5\' 1"  (1.549 m) and weight is 165 lb (74.8 kg). Her tympanic temperature is 98.2 F (36.8 C). Her blood pressure is 143/57 (abnormal) and her pulse is 64. Her respiration is 20 and oxygen saturation is 99%.   Wt Readings from Last 3 Encounters:  08/29/22 165 lb (74.8 kg)  07/31/22 164 lb 3.9 oz (74.5 kg)  07/17/22 166 lb (75.3 kg)    Physical Exam Vitals reviewed.  Constitutional:      Comments: Left breast with no masses, edema or erythema.  There is no left axillary adenopathy.  Right breast shows the lumpectomy incision  at about the 9 o'clock position.  There is little bit of erythema and ecchymoses.  There is no distinct mass.  There is no palpable right axillary adenopathy.  HENT:     Head: Normocephalic and atraumatic.  Eyes:     Pupils: Pupils are equal, round, and reactive to light.  Cardiovascular:     Rate and Rhythm: Normal rate and regular rhythm.     Heart sounds: Normal heart sounds.  Pulmonary:     Effort: Pulmonary effort is normal.     Breath sounds: Normal breath sounds.  Abdominal:     General: Bowel sounds are normal.     Palpations: Abdomen is soft.  Musculoskeletal:        General: No tenderness or deformity. Normal range of motion.      Cervical back: Normal range of motion.  Lymphadenopathy:     Cervical: No cervical adenopathy.  Skin:    General: Skin is warm and dry.     Findings: No erythema or rash.  Neurological:     Mental Status: She is alert and oriented to person, place, and time.  Psychiatric:        Behavior: Behavior normal.        Thought Content: Thought content normal.        Judgment:  Judgment normal.     Lab Results  Component Value Date   WBC 8.4 08/29/2022   HGB 12.7 08/29/2022   HCT 38.3 08/29/2022   MCV 85.1 08/29/2022   PLT 222 08/29/2022     Chemistry      Component Value Date/Time   NA 132 (L) 08/29/2022 1026   NA 136 05/08/2016 1305   NA 134 (L) 07/08/2015 1149   K 4.1 08/29/2022 1026   K 4.2 05/08/2016 1305   K 4.4 07/08/2015 1149   CL 96 (L) 08/29/2022 1026   CL 102 05/08/2016 1305   CO2 24 08/29/2022 1026   CO2 26 05/08/2016 1305   CO2 25 07/08/2015 1149   BUN 21 08/29/2022 1026   BUN 16 05/08/2016 1305   BUN 15.5 07/08/2015 1149   CREATININE 0.84 08/29/2022 1026   CREATININE 0.91 08/05/2021 1415   CREATININE 0.8 07/08/2015 1149      Component Value Date/Time   CALCIUM 10.3 08/29/2022 1026   CALCIUM 9.4 05/08/2016 1305   CALCIUM 10.0 07/08/2015 1149   ALKPHOS 59 08/29/2022 1026   ALKPHOS 56 05/08/2016 1305   ALKPHOS 62 07/08/2015 1149   AST 22 08/29/2022 1026   AST 24 07/08/2015 1149   ALT 21 08/29/2022 1026   ALT 29 05/08/2016 1305   ALT 30 07/08/2015 1149   BILITOT 0.7 08/29/2022 1026   BILITOT 0.64 07/08/2015 1149      Impression and Plan: [name] is a very charming 82 year old [place] female.  She now has what I had to believe is going to be a stage I breast cancer.  I see nothing on the pathology so far that would consider this to be an aggressive tumor.  I think that the proliferation marker clearly is a positive factor.  She has a very good prognostic breast cancer.  She will need radiation therapy.  I know she is 82 years old.  I did  realize that radiation might be a little bit controversial at her age.  However, I will see if Radiation Oncology can see her and make that decision.  She clearly will need antiestrogen therapy.  I would have her on aromatase inhibitor therapy.  I would have her on for 5 years.  Again, I suspect that with surgery and antiestrogen therapy and radiation, the risk of recurrence should be less than 10%.  Again the outcome should be quite good here.  I will plan to get her back after she has her radiation therapy and then we will see about getting her on Femara.    Josph Macho, MD 7/23/202411:24 AM

## 2022-08-30 LAB — IRON AND IRON BINDING CAPACITY (CC-WL,HP ONLY)
Iron: 79 ug/dL (ref 28–170)
Saturation Ratios: 21 % (ref 10.4–31.8)
TIBC: 374 ug/dL (ref 250–450)
UIBC: 295 ug/dL (ref 148–442)

## 2022-08-31 ENCOUNTER — Encounter: Payer: Self-pay | Admitting: Family

## 2022-09-01 NOTE — Progress Notes (Signed)
Location of Breast Cancer: Carcinoma of breast upper outer quadrant, right, dcis right breast  Histology per Pathology Report:  FINAL MICROSCOPIC DIAGNOSIS:  A. RIGHT BREAST, LUMPECTOMY: Invasive well-differentiated ductal adenocarcinoma, grade 1 (3+1+1) Tumor measures 1.8 x 1.8 x 1.5 cm (pT1c) Margins free (0.5 mm from posterior margin, 0.6 mm from inferior margin, 0.75 mm from lateral margin and 2 mm from anterior margin) Negative for angiolymphatic invasion Prognostic markers (from report [id number]): Estrogen receptor positive, progesterone receptor positive, Ki-67 10% and HER2/neu oncoprotein expression is negative (1+) Tumor involves/infiltrates a fibroadenoma showing stromal calcifications Rare peritumoral microcalcifications Fibrocystic changes including stromal fibrosis with focal adenosis and usual duct hyperplasia  ONCOLOGY TABLE:  INVASIVE CARCINOMA OF THE BREAST:  Resection  Procedure: Lumpectomy Specimen Laterality: Right Histologic Type: Ductal carcinoma Histologic Grade:      Glandular (Acinar)/Tubular Differentiation: 3      Nuclear Pleomorphism: 1      Mitotic Rate: 1      Overall Grade: Grade 1 (5/9) Tumor Size: 1.8 x 1.8 x 1.5 cm Ductal Carcinoma In Situ: Not identified Lymphatic and/or Vascular Invasion: Not identified Treatment Effect in the Breast: No known presurgical therapy Margins: All margins negative for invasive carcinoma      Distance from Closest Margin (mm): 0.5 mm from posterior margin, 0.6 mm from inferior margins, 0.75 mm from lateral margin and 2 mm from anterior margin Regional Lymph Nodes: Not applicable (no lymph nodes submitted or found) Distant Metastasis: Not applicable Breast Biomarker Testing Performed on Previous Biopsy:      Testing Performed on Case Number: [id number]            Estrogen Receptor: Positive (100%, strong staining)            Progesterone Receptor: Positive (85%, moderate to strong staining)            HER2:  Negative (1+)            Ki-67: 10% Pathologic Stage Classification (pTNM, AJCC 8th Edition): pT!c, pN n/a Representative Tumor Block: A2, A3 Comment(s): None (v4.5.0.0)   Receptor Status: ER(100%), PR (85%), Her2-neu (neg), Ki-67(10%)  Did patient present with symptoms (if so, please note symptoms) or was this found on screening mammography? 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 Gauer is back for a new visit.  She is looking quite good.  She had a lobectomy on 07/31/2022.  The pathology report ([id number]) showed a 1.8 x 1.8 x 1.5 cm well-differentiated ductal carcinoma.  All margins were negative.  There is no lymphovascular space invasion.  She did not have a sentinel node that was biopsied.   Her Oncotype score is only 12.   As such, she has a stage I breast cancer.  I think that she will not need chemotherapy.  She will need radiation therapy along with antiestrogen therapy.   She is doing well.  She has little bit of soreness over on the right side.  She has no swelling of the right arm.   There is no cough or shortness of breath.   She has had no nausea or vomiting.  There is  no change in bowel or bladder habits.  She has had no fever.   Overall, I would say performance status is probably ECOG 1.    Impression and Plan: Ms. Moad is a very charming 82 year old [place] female.  She now has what I had to believe is going to be a stage I breast cancer.  I see nothing on the pathology so far that would consider this to be an aggressive tumor.  I  think that the proliferation marker clearly is a positive factor.   She has a very good prognostic breast cancer.  She will need radiation therapy.  I know she is 82 years old.  I did realize that radiation might be a little bit controversial at her age.  However, I will see if Radiation Oncology can see her and make that decision.   She clearly will need antiestrogen therapy.  I would have her on aromatase inhibitor therapy.  I would have her on for 5 years.   Again, I suspect that with surgery and antiestrogen therapy and radiation, the risk of recurrence should be less than 10%.   Again the outcome should be quite good here.   I will plan to get her back after she has her radiation therapy and then we will see about getting her on Femara.  Lymphedema issues, if any:  {:18581} {t:21944}   Pain issues, if any:  {:18581} {PAIN DESCRIPTION:[id number]}  SAFETY ISSUES: Prior radiation? {:18581} Pacemaker/ICD? {:18581} Possible current pregnancy?no Is the patient on methotrexate? no  Current Complaints / other details:  ***

## 2022-09-05 NOTE — Progress Notes (Signed)
Radiation Oncology         [phone]) [phone] ________________________________  Name: [name] MRN: [id number]  Date: 09/06/2022  DOB: [birth year]  Follow-Up Visit Note  Outpatient  CC: [name], MD  Josph Macho, MD  Diagnosis:   [phone]   ICD-10-CM   1. Carcinoma of breast upper outer quadrant, right (HCC)  C50.411      Stage IA (cT1a, cN0, cM0) Right Breast UOQ, Invasive Ductal Carcinoma, ER+ / PR+ / Her2-, Grade 1: s/p lumpectomy without SLN evaluation   CHIEF COMPLAINT: Here to discuss management of right breast cancer  Narrative:  The patient returns today for follow-up.     Since her consultation date of 07/04/22, she opted to proceed with a right breast lumpectomy without nodal biopsies on 07/31/22 under the care of Dr. Magnus Ivan. Pathology from the procedure revealed: tumor size of 1.8 cm; histology of grade 1 invasive ductal carcinoma (with fibrocystic changes including stromal fibrosis with focal adenosis and usual duct hyperplasia); margin status to invasive disease of 0.5 mm from the posterior margin; no lymph nodes were examined;  ER status: 100% positive with strong staining intensity; PR status 85%  positive with moderate to strong staining intensity; Proliferation marker Ki67 at 10%; Her2 status negative; Grade 1.  Oncotype DX was obtained on the final surgical sample and the recurrence score of 12 predicts a risk of recurrence outside the breast over the next 9 years of 3%, if the patient's only systemic therapy were to be an antiestrogen for 5 years.  It also predicts no significant benefit from chemotherapy.  She most recently followed up with Dr. Myna Hidalgo on 08/29/22. At this time, she will likely proceed with AI consisting of Femara following adjuvant radiation therapy. She will return to Dr. Myna Hidalgo following XRT to discuss this further.    Symptomatically, the patient reports:  Lymphedema issues, if any:  none to report, she does report some pain  in her breast when driving at times, nothing of major concern  Pain issues, if any:  none to report  SAFETY ISSUES: Prior radiation? no Pacemaker/ICD? no Possible current pregnancy?no Is the patient on methotrexate? No         ALLERGIES:  is allergic to tramadol.  Meds: Current Outpatient Medications  Medication Sig Dispense Refill   acetaminophen (TYLENOL) 325 MG tablet Take 2 tablets (650 mg total) by mouth every 6 (six) hours as needed for up to 30 doses for mild pain or moderate pain. 30 tablet 0   amLODipine (NORVASC) 5 MG tablet TAKE 1 TABLET BY MOUTH DAILY 100 tablet 2   aspirin EC 81 MG tablet Take 81 mg by mouth daily. Swallow whole.     betamethasone valerate ointment (VALISONE) 0.1 % Apply a small amount to affected area topically every other day. 45 g 1   Blood Glucose Monitoring Suppl (ONE TOUCH ULTRA 2) w/Device KIT USE TO CHECK BLOOD SUGAR AS DIRECTED 1 kit 0   calcium-vitamin D (OSCAL WITH D) 500-5 MG-MCG tablet Take 1 tablet by mouth daily.     cholecalciferol (VITAMIN D) 1000 UNITS tablet Take 1,000 Units by mouth daily.     furosemide (LASIX) 20 MG tablet TAKE 1 TABLET BY MOUTH DAILY AS  NEEDED 100 tablet 2   Lancets (ONETOUCH ULTRASOFT) lancets USE AS DIRECTED 3 TIMES  DAILY 300 each 3   losartan (COZAAR) 50 MG tablet TAKE 1 TABLET BY MOUTH TWICE  DAILY 200 tablet 2   metFORMIN (GLUCOPHAGE-XR) 500  MG 24 hr tablet Take 1 tablet (500 mg total) by mouth 2 (two) times daily. TAKE 1 TABLET BY MOUTH IN THE  MORNING AND 1 TABLET AT BEDTIME Strength: 500 mg 180 tablet 2   metoCLOPramide (REGLAN) 5 MG tablet TAKE 1 TABLET BY MOUTH TWICE  DAILY AS NEEDED 180 tablet 0   metoprolol tartrate (LOPRESSOR) 50 MG tablet Take 1 tablet (50 mg total) by mouth 2 (two) times daily. 180 tablet 3   NEXIUM 40 MG capsule Take 1 capsule (40 mg total) by mouth daily before breakfast. 90 capsule 3   ondansetron (ZOFRAN) 4 MG tablet Take 4 mg by mouth every 4 (four) hours as needed. For nausea      ONETOUCH ULTRA test strip CHECK BLOOD SUGAR 3 TIMES DAILY  OR AS NEEDED 300 strip 2   potassium chloride (KLOR-CON M) 10 MEQ tablet TAKE 1 TABLET BY MOUTH DAILY 100 tablet 2   rosuvastatin (CRESTOR) 20 MG tablet TAKE 1 TABLET BY MOUTH DAILY 100 tablet 2   thiamine (VITAMIN B-1) 100 MG tablet Take 100 mg by mouth every other day.     vitamin E 180 MG (400 UNITS) capsule Take 400 Units by mouth daily.     Multiple Vitamin (MULTIVITAMIN ADULT PO) Take by mouth. (Patient not taking: Reported on 08/29/2022)     oxyCODONE (OXY IR/ROXICODONE) 5 MG immediate release tablet Take 1 tablet (5 mg total) by mouth every 6 (six) hours as needed for moderate pain, severe pain or breakthrough pain. (Patient not taking: Reported on 09/06/2022) 15 tablet 0   No current facility-administered medications for this encounter.    Physical Findings:  height is 5\' 1"  (1.549 m) and weight is 164 lb 6.4 oz (74.6 kg). Her temperature is 97.4 F (36.3 C) (abnormal). Her blood pressure is 130/57 (abnormal) and her pulse is 59 (abnormal). Her respiration is 20 and oxygen saturation is 100%. .     General: Alert and oriented, in no acute distress HEENT: Head is normocephalic. Extraocular movements are intact.   Neurologic: No obvious focalities. Speech is fluent.  Psychiatric:  Affect is appropriate.  She does repeat certain questions more than once but is alert and conversant Breast exam reveals satisfactory healing from right lateral lumpectomy  Lab Findings: Lab Results  Component Value Date   WBC 8.4 08/29/2022   HGB 12.7 08/29/2022   HCT 38.3 08/29/2022   MCV 85.1 08/29/2022   PLT 222 08/29/2022    @LASTCHEMISTRY @  Radiographic Findings: No results found.  Impression/Plan: For the patient's early stage favorable risk right breast cancer, we had a thorough discussion about her options for adjuvant therapy. One option would be antiestrogen therapy as discussed with medical oncology. She would take a pill for  approximately 5 years. The more aggressive option would be to pursue both modalities.   Of note, I discussed the data from the [employer] al trial in the [place] Journal of Medicine. She understands that tamoxifen compared to radiation plus tamoxifen demonstrated no survival benefit among the women in this study. The women were 70 years or older with stage I estrogen receptor positive breast cancer. Based on this study, I told the patient that her overall life expectancy should not be affected by adding radiotherapy to antiestrogen medication. She understands that the main benefit of  adding radiotherapy to anti estrogen therapy would be a very small but measurable local control benefit (risk of local recurrence to be lowered from ~9% --> ~2% over a  decade; her risk may be slightly higher than the average patient on this trial given the findings from her pathology - ie close margins, tumor size).  We discussed the fact that radiotherapy only provides a local control benefit while anti-estrogen pills provide local and systemic coverage. That being said, the risk of systemic failure is relatively low with her type of breast cancer.   We discussed the risks benefits and side effects of radiotherapy. She understands that the side effects would likely include some skin irritation and fatigue during the weeks of radiation. There is a risk of late effects which include but are not necessarily limited to cosmetic changes and rare lung toxicity. I would anticipate delivering approximately 1-4 weeks of radiotherapy    After a thorough discussion of standard hypofractionation over 3-4 weeks, we discussed 1 week of ultra hypofractionated radiation therapy. I discussed that this approach is a bit less standard than longer regimens.  The Fast Forward trial (1 week) method has shown a slight increase in normal tissue side effects over the 3 week hypofractionation standard at 5 years of follow-up. This includes effects  like induration, and edema.  However, for elderly patients that are keen on the convenience of minimizing the number of procedures/visits to the cancer center, this is a nevertheless a reasonable approach to consider and the vast majority of patients do very well. There is also an option of RT once weekly for 5 weeks that we discussed.   She is leaning towards radiation, likely ultra hypofractionation, but wants to think about her options and call us back with her decision.  Her supportive sister was with her today who will help her digest all this information.  We will not schedule any treatment planning unless we hear back from her -she understands that there is some slight time sensitivity to optimizing her treatment and I recommended she try to get back to [place] within the next couple of days.  She understands that if she decides not to get radiation it is highly important that she take her antiestrogen medication as prescribed    On date of service, in total, I spent 30 minutes on this encounter. Patient was seen in person.  _____________________________________   Lonie Peak, MD  This document serves as a record of services personally performed by Lonie Peak, MD. It was created on her behalf by Neena Rhymes, a trained medical scribe. The creation of this record is based on the scribe's personal observations and the provider's statements to them. This document has been checked and approved by the attending provider.

## 2022-09-06 ENCOUNTER — Encounter: Payer: Self-pay | Admitting: Radiation Oncology

## 2022-09-06 ENCOUNTER — Telehealth: Payer: Self-pay

## 2022-09-06 ENCOUNTER — Ambulatory Visit: Admission: RE | Admit: 2022-09-06 | Payer: Medicare Other | Source: Ambulatory Visit | Admitting: Radiation Oncology

## 2022-09-06 ENCOUNTER — Ambulatory Visit
Admission: RE | Admit: 2022-09-06 | Discharge: 2022-09-06 | Disposition: A | Discharge status: Home / Self Care | Payer: Medicare Other | Source: Ambulatory Visit | Attending: Radiation Oncology | Admitting: Radiation Oncology

## 2022-09-06 ENCOUNTER — Encounter (INDEPENDENT_AMBULATORY_CARE_PROVIDER_SITE_OTHER): Payer: Self-pay

## 2022-09-06 VITALS — BP 130/57 | HR 59 | Temp 97.4°F | Resp 20 | Ht 61.0 in | Wt 164.4 lb

## 2022-09-06 DIAGNOSIS — C50411 Malignant neoplasm of upper-outer quadrant of right female breast: Secondary | ICD-10-CM

## 2022-09-06 DIAGNOSIS — D0511 Intraductal carcinoma in situ of right breast: Secondary | ICD-10-CM

## 2022-09-06 DIAGNOSIS — Z170 Estrogen receptor positive status [ER+]: Secondary | ICD-10-CM | POA: Diagnosis not present

## 2022-09-06 NOTE — Telephone Encounter (Signed)
RN called pt for meaningful use and nurse evaluation information. Note completed and routed to Dr. Basilio Cairo.

## 2022-09-07 ENCOUNTER — Ambulatory Visit (INDEPENDENT_AMBULATORY_CARE_PROVIDER_SITE_OTHER): Payer: Medicare Other | Admitting: *Deleted

## 2022-09-07 VITALS — BP 131/78 | HR 64 | Ht 61.0 in | Wt 164.4 lb

## 2022-09-07 DIAGNOSIS — Z0000 Encounter for general adult medical examination without abnormal findings: Secondary | ICD-10-CM

## 2022-09-07 NOTE — Patient Instructions (Signed)
[name] , Thank you for taking time to come for your Medicare Wellness Visit. I appreciate your ongoing commitment to your health goals. Please review the following plan we discussed and let me know if I can assist you in the future.     This is a list of the screening recommended for you and due dates:  Health Maintenance  Topic Date Due   Zoster (Shingles) Vaccine (1 of 2) Never done   DTaP/Tdap/Td vaccine (2 - Tdap) 03/03/2015   Complete foot exam   11/13/2015   Yearly kidney health urinalysis for diabetes  07/06/2022   Flu Shot  09/07/2022   COVID-19 Vaccine (4 - 2023-24 season) 01/26/2023*   Hemoglobin A1C  12/27/2022   Eye exam for diabetics  05/25/2023   Yearly kidney function blood test for diabetes  08/29/2023   Medicare Annual Wellness Visit  09/07/2023   Pneumonia Vaccine  Completed   DEXA scan (bone density measurement)  Completed   HPV Vaccine  Aged Out   Hepatitis C Screening  Discontinued  *Topic was postponed. The date shown is not the original due date.    Next appointment: Follow up in one year for your annual wellness visit.   Preventive Care 2 Years and Older, Female Preventive care refers to lifestyle choices and visits with your health care provider that can promote health and wellness. What does preventive care include? A yearly physical exam. This is also called an annual well check. Dental exams once or twice a year. Routine eye exams. Ask your health care provider how often you should have your eyes checked. Personal lifestyle choices, including: Daily care of your teeth and gums. Regular physical activity. Eating a healthy diet. Avoiding tobacco and drug use. Limiting alcohol use. Practicing safe sex. Taking low-dose aspirin every day. Taking vitamin and mineral supplements as recommended by your health care provider. What happens during an annual well check? The services and screenings done by your health care provider during your annual  well check will depend on your age, overall health, lifestyle risk factors, and family history of disease. Counseling  Your health care provider may ask you questions about your: Alcohol use. Tobacco use. Drug use. Emotional well-being. Home and relationship well-being. Sexual activity. Eating habits. History of falls. Memory and ability to understand (cognition). Work and work Astronomer. Reproductive health. Screening  You may have the following tests or measurements: Height, weight, and BMI. Blood pressure. Lipid and cholesterol levels. These may be checked every 5 years, or more frequently if you are over 39 years old. Skin check. Lung cancer screening. You may have this screening every year starting at age 56 if you have a 30-pack-year history of smoking and currently smoke or have quit within the past 15 years. Fecal occult blood test (FOBT) of the stool. You may have this test every year starting at age 59. Flexible sigmoidoscopy or colonoscopy. You may have a sigmoidoscopy every 5 years or a colonoscopy every 10 years starting at age 66. Hepatitis C blood test. Hepatitis B blood test. Sexually transmitted disease (STD) testing. Diabetes screening. This is done by checking your blood sugar (glucose) after you have not eaten for a while (fasting). You may have this done every 1-3 years. Bone density scan. This is done to screen for osteoporosis. You may have this done starting at age 88. Mammogram. This may be done every 1-2 years. Talk to your health care provider about how often you should have regular mammograms. Talk with  your health care provider about your test results, treatment options, and if necessary, the need for more tests. Vaccines  Your health care provider may recommend certain vaccines, such as: Influenza vaccine. This is recommended every year. Tetanus, diphtheria, and acellular pertussis (Tdap, Td) vaccine. You may need a Td booster every 10 years. Zoster  vaccine. You may need this after age 32. Pneumococcal 13-valent conjugate (PCV13) vaccine. One dose is recommended after age 22. Pneumococcal polysaccharide (PPSV23) vaccine. One dose is recommended after age 75. Talk to your health care provider about which screenings and vaccines you need and how often you need them. This information is not intended to replace advice given to you by your health care provider. Make sure you discuss any questions you have with your health care provider. Document Released: 02/19/2015 Document Revised: 10/13/2015 Document Reviewed: 11/24/2014 Elsevier Interactive Patient Education  2017 [employer].  Fall Prevention in the Home Falls can cause injuries. They can happen to people of all ages. There are many things you can do to make your home safe and to help prevent falls. What can I do on the outside of my home? Regularly fix the edges of walkways and driveways and fix any cracks. Remove anything that might make you trip as you walk through a door, such as a raised step or threshold. Trim any bushes or trees on the path to your home. Use bright outdoor lighting. Clear any walking paths of anything that might make someone trip, such as rocks or tools. Regularly check to see if handrails are loose or broken. Make sure that both sides of any steps have handrails. Any raised decks and porches should have guardrails on the edges. Have any leaves, snow, or ice cleared regularly. Use sand or salt on walking paths during winter. Clean up any spills in your garage right away. This includes oil or grease spills. What can I do in the bathroom? Use night lights. Install grab bars by the toilet and in the tub and shower. Do not use towel bars as grab bars. Use non-skid mats or decals in the tub or shower. If you need to sit down in the shower, use a plastic, non-slip stool. Keep the floor dry. Clean up any water that spills on the floor as soon as it happens. Remove  soap buildup in the tub or shower regularly. Attach bath mats securely with double-sided non-slip rug tape. Do not have throw rugs and other things on the floor that can make you trip. What can I do in the bedroom? Use night lights. Make sure that you have a light by your bed that is easy to reach. Do not use any sheets or blankets that are too big for your bed. They should not hang down onto the floor. Have a firm chair that has side arms. You can use this for support while you get dressed. Do not have throw rugs and other things on the floor that can make you trip. What can I do in the kitchen? Clean up any spills right away. Avoid walking on wet floors. Keep items that you use a lot in easy-to-reach places. If you need to reach something above you, use a strong step stool that has a grab bar. Keep electrical cords out of the way. Do not use floor polish or wax that makes floors slippery. If you must use wax, use non-skid floor wax. Do not have throw rugs and other things on the floor that can make you trip.  What can I do with my stairs? Do not leave any items on the stairs. Make sure that there are handrails on both sides of the stairs and use them. Fix handrails that are broken or loose. Make sure that handrails are as long as the stairways. Check any carpeting to make sure that it is firmly attached to the stairs. Fix any carpet that is loose or worn. Avoid having throw rugs at the top or bottom of the stairs. If you do have throw rugs, attach them to the floor with carpet tape. Make sure that you have a light switch at the top of the stairs and the bottom of the stairs. If you do not have them, ask someone to add them for you. What else can I do to help prevent falls? Wear shoes that: Do not have high heels. Have rubber bottoms. Are comfortable and fit you well. Are closed at the toe. Do not wear sandals. If you use a stepladder: Make sure that it is fully opened. Do not climb a  closed stepladder. Make sure that both sides of the stepladder are locked into place. Ask someone to hold it for you, if possible. Clearly mark and make sure that you can see: Any grab bars or handrails. First and last steps. Where the edge of each step is. Use tools that help you move around (mobility aids) if they are needed. These include: Canes. Walkers. Scooters. Crutches. Turn on the lights when you go into a dark area. Replace any light bulbs as soon as they burn out. Set up your furniture so you have a clear path. Avoid moving your furniture around. If any of your floors are uneven, fix them. If there are any pets around you, be aware of where they are. Review your medicines with your doctor. Some medicines can make you feel dizzy. This can increase your chance of falling. Ask your doctor what other things that you can do to help prevent falls. This information is not intended to replace advice given to you by your health care provider. Make sure you discuss any questions you have with your health care provider. Document Released: 11/19/2008 Document Revised: 07/01/2015 Document Reviewed: 02/27/2014 Elsevier Interactive Patient Education  2017 [employer].

## 2022-09-07 NOTE — Progress Notes (Signed)
Subjective:   [name] is a 82 y.o. female who presents for Medicare Annual (Subsequent) preventive examination.  Visit Complete: Virtual  I connected with  [name] on 09/07/22 by a audio enabled telemedicine application and verified that I am speaking with the correct person using two identifiers.  Patient Location: Home  Provider Location: Office/Clinic  I discussed the limitations of evaluation and management by telemedicine. The patient expressed understanding and agreed to proceed.   Review of Systems     Cardiac Risk Factors include: advanced age (>71men, >51 women);diabetes mellitus;dyslipidemia;hypertension;obesity (BMI >30kg/m2)     Objective:   Vital signs are patient reported. Today's Vitals   09/07/22 1512  BP: 131/78  Pulse: 64  Weight: 164 lb 6.4 oz (74.6 kg)  Height: 5\' 1"  (1.549 m)   Body mass index is 31.06 kg/m.     09/07/2022    3:09 PM 09/06/2022   10:24 AM 08/29/2022   11:01 AM 07/31/2022    8:31 AM 07/17/2022    2:07 PM 06/30/2022    2:09 PM 05/05/2022    1:47 PM  Advanced Directives  Does Patient Have a Medical Advance Directive? No No No No No No No  Would patient like information on creating a medical advance directive? No - Patient declined No - Patient declined No - Patient declined No - Patient declined No - Patient declined No - Patient declined No - Patient declined    Current Medications (verified) Outpatient Encounter Medications as of 09/07/2022  Medication Sig   acetaminophen (TYLENOL) 325 MG tablet Take 2 tablets (650 mg total) by mouth every 6 (six) hours as needed for up to 30 doses for mild pain or moderate pain.   amLODipine (NORVASC) 5 MG tablet TAKE 1 TABLET BY MOUTH DAILY   aspirin EC 81 MG tablet Take 81 mg by mouth daily. Swallow whole.   betamethasone valerate ointment (VALISONE) 0.1 % Apply a small amount to affected area topically every other day.   Blood Glucose Monitoring Suppl (ONE TOUCH ULTRA 2)  w/Device KIT USE TO CHECK BLOOD SUGAR AS DIRECTED   calcium-vitamin D (OSCAL WITH D) 500-5 MG-MCG tablet Take 1 tablet by mouth daily.   cholecalciferol (VITAMIN D) 1000 UNITS tablet Take 1,000 Units by mouth daily.   furosemide (LASIX) 20 MG tablet TAKE 1 TABLET BY MOUTH DAILY AS  NEEDED   Lancets (ONETOUCH ULTRASOFT) lancets USE AS DIRECTED 3 TIMES  DAILY   losartan (COZAAR) 50 MG tablet TAKE 1 TABLET BY MOUTH TWICE  DAILY   metFORMIN (GLUCOPHAGE-XR) 500 MG 24 hr tablet Take 1 tablet (500 mg total) by mouth 2 (two) times daily. TAKE 1 TABLET BY MOUTH IN THE  MORNING AND 1 TABLET AT BEDTIME Strength: 500 mg   metoCLOPramide (REGLAN) 5 MG tablet TAKE 1 TABLET BY MOUTH TWICE  DAILY AS NEEDED   metoprolol tartrate (LOPRESSOR) 50 MG tablet Take 1 tablet (50 mg total) by mouth 2 (two) times daily.   Multiple Vitamin (MULTIVITAMIN ADULT PO) Take by mouth.   NEXIUM 40 MG capsule Take 1 capsule (40 mg total) by mouth daily before breakfast.   ondansetron (ZOFRAN) 4 MG tablet Take 4 mg by mouth every 4 (four) hours as needed. For nausea   ONETOUCH ULTRA test strip CHECK BLOOD SUGAR 3 TIMES DAILY  OR AS NEEDED   potassium chloride (KLOR-CON M) 10 MEQ tablet TAKE 1 TABLET BY MOUTH DAILY   rosuvastatin (CRESTOR) 20 MG tablet TAKE 1 TABLET BY  MOUTH DAILY   thiamine (VITAMIN B-1) 100 MG tablet Take 100 mg by mouth every other day.   vitamin E 180 MG (400 UNITS) capsule Take 400 Units by mouth daily.   [DISCONTINUED] oxyCODONE (OXY IR/ROXICODONE) 5 MG immediate release tablet Take 1 tablet (5 mg total) by mouth every 6 (six) hours as needed for moderate pain, severe pain or breakthrough pain. (Patient not taking: Reported on 09/06/2022)   No facility-administered encounter medications on file as of 09/07/2022.    Allergies (verified) Tramadol   History: Past Medical History:  Diagnosis Date   Abdominal pain in female 03/18/2010   Qualifier: Diagnosis of  By: Leone Payor MD, Alfonse Ras E    Anemia  06/08/2014   Anxiety    Arthritis    Spinal Osteoarthritis   Cancer (HCC)    Carcinoid tumor of stomach    Cataract    Chest pain    Myoview 12/15 no ischemia.   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 09/05/2006   Qualifier: Diagnosis of  By: Charlsie Quest RMA, Lucy     Diabetic peripheral neuropathy (HCC) 10/29/2013   Encounter for preventative adult health care exam with abnormal findings 09/14/2013   Esophageal reflux    Gastric polyp    Fundic Gland   Gastroparesis    Headache(784.0)    Heart murmur    Echocardiogram 2/11: EF 60-65%, mild LAE, grade 1 diastolic dysfunction, aortic valve sclerosis, mean gradient 9 mm of mercury, PASP 34   Hematuria 03/16/2016   Iron deficiency anemia, unspecified    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   Leg swelling    bilateral   Neck pain 04/22/2015   PONV (postoperative nausea and vomiting)    pt states body temperature drops every time she has anesthesia; pt states only needs small amount of anesthesia   PSVT (paroxysmal supraventricular tachycardia)    Pure hypercholesterolemia    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   Renal insufficiency 06/18/2019   pt states  L kidney function very low-functions at about 20%   Stroke [facility])    tia, 2014   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   Type II or unspecified type diabetes mellitus without mention of complication, not stated as uncontrolled    Unspecified essential hypertension    Unspecified hereditary and idiopathic peripheral neuropathy 10/29/2013   Past Surgical History:  Procedure Laterality Date   BREAST BIOPSY Right 06/13/2022   [place] RT BREAST BX W LOC DEV 1ST LESION IMG BX SPEC US GUIDE 06/13/2022 [facility] MAMMOGRAPHY   BREAST LUMPECTOMY Right 07/31/2022   Procedure: RIGHT BREAST LUMPECTOMY;  Surgeon: Abigail Miyamoto, MD;  Location: [facility];  Service: General;  Laterality: Right;   CHOLECYSTECTOMY  1993   COLONOSCOPY   11/11/2010   diverticulosis   DILATATION & CURRETTAGE/HYSTEROSCOPY WITH RESECTOCOPE N/A 02/25/2013   Procedure: Attempted hysteroscopy with uterine perforation;  Surgeon: Jacqualin Combes de Gwenevere Ghazi, MD;  Location: [facility];  Service: Gynecology;  Laterality: N/A;   ESOPHAGOGASTRODUODENOSCOPY  08/29/2010; 09/15/2010   Carcinoid tumor less than 1 cm in July 2012 not seen in August 2012 , gastritis, fundic gland polyps   ESOPHAGOGASTRODUODENOSCOPY  05/16/2011   ESOPHAGOGASTRODUODENOSCOPY  06/14/2012   EUS  12/15/2010   Procedure: UPPER ENDOSCOPIC ULTRASOUND (EUS) LINEAR;  Surgeon: Rob Bunting, MD;  Location: [facility];  Service: Endoscopy;  Laterality: N/A;   EYE SURGERY Bilateral    Bi lateral cateracts  and bi lateral laser   LAPAROSCOPY N/A 02/25/2013   Procedure: Cystoscopy and laparoscopy with fulguration of uterine serosa;  Surgeon: Jacqualin Combes de Gwenevere Ghazi, MD;  Location: [facility];  Service: Gynecology;  Laterality: N/A;   TONSILLECTOMY     Family History  Problem Relation Age of Onset   Diabetes Mother    Stroke Father        deceased age 110   Heart disease Sister        deceased MI age 70   Diabetes Sister    Heart disease Sister    Hypertension Sister    Hyperlipidemia Sister    Diabetes Sister    Heart disease Sister    Hypertension Sister    Hyperlipidemia Sister    Heart disease Brother        deceased MI age 64   Intellectual disability Brother    Diabetes Brother    Heart disease Brother    Hypertension Brother    Hyperlipidemia Brother    Diabetes Maternal Grandmother    Hypertension Paternal Grandmother    Colon cancer Neg Hx    Esophageal cancer Neg Hx    Stomach cancer Neg Hx    Rectal cancer Neg Hx    Social History   Socioeconomic History   Marital status: Widowed    Spouse name: Not on file   Number of children: 0   Years of education: college   Highest education level: Not on file  Occupational History   Occupation: retired   Tobacco Use   Smoking status: Never   Smokeless tobacco: Never   Tobacco comments:    Never used tobacco  Vaping Use   Vaping status: Never Used  Substance and Sexual Activity   Alcohol use: No    Alcohol/week: 0.0 standard drinks of alcohol   Drug use: No   Sexual activity: Never    Partners: Male    Birth control/protection: Post-menopausal    Comment: lives alone, no dietary restrictions except avoid fresh veg, fruit, whole grains  Other Topics Concern   Not on file  Social History Narrative   Patient was married ([name]) - widow   Patient does not have any children.   Patient is right-handed.   Patient has a BA degree.   One caffeine drink daily    Social Determinants of Health   Financial Resource Strain: Low Risk  (09/07/2022)   Overall Financial Resource Strain (CARDIA)    Difficulty of Paying Living Expenses: Not very hard  Food Insecurity: No Food Insecurity (06/30/2022)   Hunger Vital Sign    Worried About Running Out of Food in the Last Year: Never true    Ran Out of Food in the Last Year: Never true  Transportation Needs: No Transportation Needs (06/30/2022)   PRAPARE - Administrator, Civil Service (Medical): No    Lack of Transportation (Non-Medical): No  Recent Concern: Transportation Needs - Unmet Transportation Needs (06/30/2022)   PRAPARE - Administrator, Civil Service (Medical): Yes    Lack of Transportation (Non-Medical): No  Physical Activity: Inactive (09/07/2022)   Exercise Vital Sign    Days of Exercise per Week: 0 days    Minutes of Exercise per Session: 0 min  Stress: No Stress Concern Present (09/07/2022)   [employer] of Occupational Health - Occupational Stress Questionnaire    Feeling of Stress : Only a little  Recent Concern: Stress - Stress Concern Present (07/10/2022)  [employer] of Occupational Health - Occupational Stress Questionnaire    Feeling of Stress : To some extent  Social Connections: Moderately  Isolated (09/07/2022)   Social Connection and Isolation Panel [NHANES]    Frequency of Communication with Friends and Family: More than three times a week    Frequency of Social Gatherings with Friends and Family: Once a week    Attends Religious Services: More than 4 times per year    Active Member of [employer] or Organizations: No    Attends Banker Meetings: Never    Marital Status: Widowed    Tobacco Counseling Counseling given: Not Answered Tobacco comments: Never used tobacco   Clinical Intake:  Pre-visit preparation completed: Yes  Pain : No/denies pain  BMI - recorded: 31.06 Nutritional Status: BMI > 30  Obese Nutritional Risks: None Diabetes: Yes CBG done?: No Did pt. bring in CBG monitor from home?: No  How often do you need to have someone help you when you read instructions, pamphlets, or other written materials from your doctor or pharmacy?: 1 - Never  Interpreter Needed?: No  Information entered by :: Donne Anon, CMA   Activities of Daily Living    09/07/2022    3:02 PM 07/31/2022    8:38 AM  In your present state of health, do you have any difficulty performing the following activities:  Hearing? 0 0  Vision? 0 0  Difficulty concentrating or making decisions? 1 0  Comment forgets names sometimes   Walking or climbing stairs? 1 0  Dressing or bathing? 0 0  Doing errands, shopping? 0   Preparing Food and eating ? N   Using the Toilet? N   In the past six months, have you accidently leaked urine? Y   Do you have problems with loss of bowel control? N   Managing your Medications? N   Managing your Finances? N   Housekeeping or managing your Housekeeping? N     Patient Care Team: [name], MD as PCP - General (Family Medicine) Rollene Rotunda, MD as PCP - Cardiology (Cardiology) Iva Boop, MD as Consulting Physician (Gastroenterology) Love, Genene Churn, MD (Neurology) Drema Dallas, DO as Consulting Physician (Neurology) Henrene Pastor, RPH-CPP (Pharmacist) Josph Macho, MD as Medical Oncologist (Oncology) Gwendel Hanson, RN as Oncology Nurse Navigator Colletta Maryland, RN as [facility] Care Management  Indicate any recent Medical Services you may have received from other than [facility] providers in the past year (date may be approximate).     Assessment:   This is a routine wellness examination for [name].  Hearing/Vision screen No results found.  Dietary issues and exercise activities discussed:     Goals Addressed   None    Depression Screen    09/07/2022    3:06 PM 07/06/2022    2:32 PM 06/30/2022    2:33 PM 03/28/2022    1:12 PM 10/13/2021    1:51 PM 07/05/2021    2:04 PM 06/07/2021    1:03 PM  PHQ 2/9 Scores  PHQ - 2 Score 0 0 0 0 0 0 0  PHQ- 9 Score  0  0 1      Fall Risk    09/07/2022    3:03 PM 07/06/2022    2:32 PM 03/28/2022    1:12 PM 10/13/2021    1:51 PM 07/05/2021    2:03 PM  Fall Risk   Falls in the past year? 0 0 0 0  0  Number falls in past yr: 0 0 0 0 0  Injury with Fall? 0 0 0 0 0  Risk for fall due to : No Fall Risks    No Fall Risks  Follow up Falls evaluation completed Falls evaluation completed Falls evaluation completed Falls evaluation completed Falls evaluation completed    MEDICARE RISK AT HOME:  Medicare Risk at Home - 09/07/22 1503     Any stairs in or around the home? Yes    If so, are there any without handrails? No    Home free of loose throw rugs in walkways, pet beds, electrical cords, etc? Yes    Adequate lighting in your home to reduce risk of falls? Yes    Life alert? No    Use of a cane, walker or w/c? No    Grab bars in the bathroom? Yes    Shower chair or bench in shower? Yes    Elevated toilet seat or a handicapped toilet? No             TIMED UP AND GO:  Was the test performed?  No    Cognitive Function:    04/24/2017    1:30 PM 01/11/2016    2:10 PM  MMSE - Mini Mental State Exam  Orientation to time 5 5  Orientation to  Place 5 5  Registration 3 3  Attention/ Calculation 5 5  Recall 3 0  Language- name 2 objects 2 2  Language- repeat 1 1  Language- follow 3 step command 3 3  Language- read & follow direction 1 1  Write a sentence 1 1  Copy design 1 1  Total score 30 27        09/07/2022    3:12 PM 06/07/2021    1:09 PM  6CIT Screen  What Year? 0 points 0 points  What month? 0 points 0 points  What time? 0 points 0 points  Count back from 20 0 points 0 points  Months in reverse 0 points 4 points  Repeat phrase 0 points 4 points  Total Score 0 points 8 points    Immunizations Immunization History  Administered Date(s) Administered   Fluad Quad(high Dose 65+) 11/18/2019, 12/01/2020, 10/13/2021   Influenza Split 10/31/2010, 11/28/2011   Influenza Whole 11/19/2007, 11/04/2008, 11/10/2009   Influenza, High Dose Seasonal PF 11/11/2015, 11/21/2016   Influenza,inj,Quad PF,6+ Mos 01/09/2013, 10/20/2013, 11/13/2014, 11/06/2018   PFIZER(Purple Top)SARS-COV-2 Vaccination 03/14/2019, 04/09/2019, 12/29/2019   Pneumococcal Conjugate-13 09/11/2013   Pneumococcal Polysaccharide-23 03/13/2007   Td 03/02/2005    TDAP status: Due, Education has been provided regarding the importance of this vaccine. Advised may receive this vaccine at local pharmacy or Health Dept. Aware to provide a copy of the vaccination record if obtained from local pharmacy or Health Dept. Verbalized acceptance and understanding.  Flu Vaccine status: Due, Education has been provided regarding the importance of this vaccine. Advised may receive this vaccine at local pharmacy or Health Dept. Aware to provide a copy of the vaccination record if obtained from local pharmacy or Health Dept. Verbalized acceptance and understanding.  Pneumococcal vaccine status: Up to date  Covid-19 vaccine status: Information provided on how to obtain vaccines.   Qualifies for Shingles Vaccine? Yes   Zostavax completed No   Shingrix Completed?: No.     Education has been provided regarding the importance of this vaccine. Patient has been advised to call insurance company to determine out of pocket expense if they have not yet  received this vaccine. Advised may also receive vaccine at local pharmacy or Health Dept. Verbalized acceptance and understanding.  Screening Tests Health Maintenance  Topic Date Due   Zoster Vaccines- Shingrix (1 of 2) Never done   DTaP/Tdap/Td (2 - Tdap) 03/03/2015   FOOT EXAM  11/13/2015   Medicare Annual Wellness (AWV)  06/08/2022   Diabetic kidney evaluation - Urine ACR  07/06/2022   INFLUENZA VACCINE  09/07/2022   COVID-19 Vaccine (4 - 2023-24 season) 01/26/2023 (Originally 10/07/2021)   HEMOGLOBIN A1C  12/27/2022   OPHTHALMOLOGY EXAM  05/25/2023   Diabetic kidney evaluation - eGFR measurement  08/29/2023   Pneumonia Vaccine 63+ Years old  Completed   DEXA SCAN  Completed   HPV VACCINES  Aged Out   Hepatitis C Screening  Discontinued    Health Maintenance  Health Maintenance Due  Topic Date Due   Zoster Vaccines- Shingrix (1 of 2) Never done   DTaP/Tdap/Td (2 - Tdap) 03/03/2015   FOOT EXAM  11/13/2015   Medicare Annual Wellness (AWV)  06/08/2022   Diabetic kidney evaluation - Urine ACR  07/06/2022   INFLUENZA VACCINE  09/07/2022    Colorectal cancer screening: No longer required.   Mammogram status: Completed 06/01/22. Repeat every year  Bone Density status: Completed 04/26/20. Results reflect: Bone density results: NORMAL. Repeat every 2 years.  Lung Cancer Screening: (Low Dose CT Chest recommended if Age 69-80 years, 20 pack-year currently smoking OR have quit w/in 15years.) does not qualify.   Additional Screening:  Hepatitis C Screening: does not qualify  Vision Screening: Recommended annual ophthalmology exams for early detection of glaucoma and other disorders of the eye. Is the patient up to date with their annual eye exam?  Yes  Who is the provider or what is the name of the office in  which the patient attends annual eye exams? [facility]. If pt is not established with a provider, would they like to be referred to a provider to establish care? No .   Dental Screening: Recommended annual dental exams for proper oral hygiene  Diabetic Foot Exam: Diabetic Foot Exam: Overdue, Pt has been advised about the importance in completing this exam. Pt is scheduled for diabetic foot exam on N/a.  Community Resource Referral / Chronic Care Management: CRR required this visit?  No   CCM required this visit?  No     Plan:     I have personally reviewed and noted the following in the patient's chart:   Medical and social history Use of alcohol, tobacco or illicit drugs  Current medications and supplements including opioid prescriptions. Patient is not currently taking opioid prescriptions. Functional ability and status Nutritional status Physical activity Advanced directives List of other physicians Hospitalizations, surgeries, and ER visits in previous 12 months Vitals Screenings to include cognitive, depression, and falls Referrals and appointments  In addition, I have reviewed and discussed with patient certain preventive protocols, quality metrics, and best practice recommendations. A written personalized care plan for preventive services as well as general preventive health recommendations were provided to patient.     Donne Anon, CMA   09/07/2022   After Visit Summary: (MyChart) Due to this being a telephonic visit, the after visit summary with patients personalized plan was offered to patient via MyChart   Nurse Notes: None

## 2022-09-08 ENCOUNTER — Telehealth: Payer: Self-pay | Admitting: *Deleted

## 2022-09-08 ENCOUNTER — Other Ambulatory Visit: Payer: Self-pay | Admitting: *Deleted

## 2022-09-08 MED ORDER — LETROZOLE 2.5 MG PO TABS: 2.5000 mg | ORAL_TABLET | Freq: Every day | ORAL | 3 refills | Status: DC

## 2022-09-08 NOTE — Telephone Encounter (Signed)
Call received from patient to inform Dr. Myna Hidalgo that she would like to not have XRT and would like his opinion on that decision and would also like to know if she chooses to not take XRT when would she start Femara.  Dr. Myna Hidalgo notified.  Call placed back to patient and patient notified per order of Dr. Myna Hidalgo that he supports her decision to not have XRT if that's what she chooses to do and that Femara could be started now.  Pt is appreciative of information and states that she will not proceed with XRT and would like to start Femara now having it sent to [facility].  Dr. Myna Hidalgo notified.

## 2022-09-13 ENCOUNTER — Ambulatory Visit: Payer: Self-pay

## 2022-09-13 NOTE — Patient Outreach (Signed)
  Care Coordination   Follow Up Visit Note   09/13/2022 Name: [name] MRN: [id number] DOB: 26-Jun-1940  [name] is a 82 y.o. year old female who sees Valerie Canary, MD for primary care. I spoke with  [name] by phone today.  What matters to the patients health and wellness today?  [name] reports she is in communication with oncology provider. She states she has spoken with LCSW, [name]. She denies any additional care coordination or resource needs at this time. RNCM encouraged [name] to contact RNCM if care coordination needs in the future-confirmed she has RNCM's contact number.  Goals Addressed             This Visit's Progress    COMPLETED: assist with health management       Interventions Today    Flowsheet Row Most Recent Value  Chronic Disease   Chronic disease during today's visit Other  General Interventions   General Interventions Discussed/Reviewed General Interventions Reviewed, Doctor Visits  Doctor Visits Discussed/Reviewed Doctor Visits Reviewed  [reviewed upcoming/scheduled appointments. advised to contact providers with health questions/concerns as needed.]  Education Interventions   Education Provided Provided Education  Provided Verbal Education On When to see the doctor, Other, Medication  [advised patient to continue to take medications as prescribed, discussed importance of maintining contact with providers involved on her care team. emphasized nurse navigator([name]) and encouraged to communicate as needed-verbalized understanding.]            SDOH assessments and interventions completed:  No  Care Coordination Interventions:  Yes, provided   Follow up plan: No further intervention required.   Encounter Outcome:  Pt. Visit Completed   Kathyrn Sheriff, RN, MSN, BSN, CCM [facility] Care Coordinator [phone]

## 2022-09-13 NOTE — Patient Instructions (Addendum)
Visit Information  Thank you for taking time to visit with me today. Please don't hesitate to contact me if I can be of assistance to you.   Following are the goals we discussed today:   Goals Addressed             This Visit's Progress    COMPLETED: assist with health management       Interventions Today    Flowsheet Row Most Recent Value  Chronic Disease   Chronic disease during today's visit Other  General Interventions   General Interventions Discussed/Reviewed General Interventions Reviewed, Doctor Visits  Doctor Visits Discussed/Reviewed Doctor Visits Reviewed  [reviewed upcoming/scheduled appointments. advised to contact providers with health questions/concerns as needed.]  Education Interventions   Education Provided Provided Education  Provided Verbal Education On When to see the doctor, Other, Medication  [advised patient to continue to take medications as prescribed, discussed importance of maintining contact with providers involved on her care team. emphasized nurse navigator(Jamie Traci) and encouraged to communicate as needed-verbalized understanding.]            Please call the care guide team at [phone] if you need to schedule your appointment.   If you are experiencing a Mental Health or Behavioral Health Crisis or need someone to talk to, please call the Suicide and Crisis Lifeline: 988 call the [place] National Suicide Prevention Lifeline: (419)543-0933 or TTY: 406-336-9112 TTY 6042785637) to talk to a trained counselor call 1-800-273-TALK (toll free, 24 hour hotline)  Kathyrn Sheriff, RN, MSN, BSN, CCM [facility] Care Coordinator [phone]

## 2022-09-28 ENCOUNTER — Other Ambulatory Visit (INDEPENDENT_AMBULATORY_CARE_PROVIDER_SITE_OTHER): Payer: Medicare Other

## 2022-09-28 DIAGNOSIS — E7800 Pure hypercholesterolemia, unspecified: Secondary | ICD-10-CM

## 2022-09-28 DIAGNOSIS — D500 Iron deficiency anemia secondary to blood loss (chronic): Secondary | ICD-10-CM

## 2022-09-28 DIAGNOSIS — E519 Thiamine deficiency, unspecified: Secondary | ICD-10-CM

## 2022-09-28 DIAGNOSIS — E559 Vitamin D deficiency, unspecified: Secondary | ICD-10-CM | POA: Diagnosis not present

## 2022-09-28 LAB — CBC WITH DIFFERENTIAL/PLATELET
Basophils Absolute: 0 10*3/uL (ref 0.0–0.1)
Basophils Relative: 0.6 % (ref 0.0–3.0)
Eosinophils Absolute: 0.1 10*3/uL (ref 0.0–0.7)
Eosinophils Relative: 1.7 % (ref 0.0–5.0)
HCT: 38 % (ref 36.0–46.0)
Hemoglobin: 12.6 g/dL (ref 12.0–15.0)
Lymphocytes Relative: 37.3 % (ref 12.0–46.0)
Lymphs Abs: 3 10*3/uL (ref 0.7–4.0)
MCHC: 33.1 g/dL (ref 30.0–36.0)
MCV: 85.7 fl (ref 78.0–100.0)
Monocytes Absolute: 0.7 10*3/uL (ref 0.1–1.0)
Monocytes Relative: 9 % (ref 3.0–12.0)
Neutro Abs: 4.2 10*3/uL (ref 1.4–7.7)
Neutrophils Relative %: 51.4 % (ref 43.0–77.0)
Platelets: 236 10*3/uL (ref 150.0–400.0)
RBC: 4.43 Mil/uL (ref 3.87–5.11)
RDW: 13 % (ref 11.5–15.5)
WBC: 8.2 10*3/uL (ref 4.0–10.5)

## 2022-09-28 LAB — HEMOGLOBIN A1C: Hgb A1c MFr Bld: 6.6 % — ABNORMAL HIGH (ref 4.6–6.5)

## 2022-09-28 LAB — COMPREHENSIVE METABOLIC PANEL
ALT: 18 U/L (ref 0–35)
AST: 19 U/L (ref 0–37)
Albumin: 4.3 g/dL (ref 3.5–5.2)
Alkaline Phosphatase: 60 U/L (ref 39–117)
BUN: 24 mg/dL — ABNORMAL HIGH (ref 6–23)
CO2: 28 meq/L (ref 19–32)
Calcium: 9.8 mg/dL (ref 8.4–10.5)
Chloride: 96 meq/L (ref 96–112)
Creatinine, Ser: 0.85 mg/dL (ref 0.40–1.20)
GFR: 64.22 mL/min (ref >60.00)
Glucose, Bld: 125 mg/dL — ABNORMAL HIGH (ref 70–99)
Potassium: 4.4 meq/L (ref 3.5–5.1)
Sodium: 134 meq/L — ABNORMAL LOW (ref 135–145)
Total Bilirubin: 0.6 mg/dL (ref 0.2–1.2)
Total Protein: 6.9 g/dL (ref 6.0–8.3)

## 2022-09-28 LAB — LIPID PANEL
Cholesterol: 227 mg/dL — ABNORMAL HIGH (ref 0–200)
HDL: 60.4 mg/dL (ref >39.00)
LDL Cholesterol: 133 mg/dL — ABNORMAL HIGH (ref 0–99)
NonHDL: 166.8
Total CHOL/HDL Ratio: 4
Triglycerides: 171 mg/dL — ABNORMAL HIGH (ref 0.0–149.0)
VLDL: 34.2 mg/dL (ref 0.0–40.0)

## 2022-09-29 LAB — IRON,TIBC AND FERRITIN PANEL
%SAT: 18 % (ref 16–45)
Ferritin: 207 ng/mL (ref 16–288)
Iron: 60 ug/dL (ref 45–160)
TIBC: 330 ug/dL (ref 250–450)

## 2022-09-29 LAB — TSH: TSH: 2.54 u[IU]/mL (ref 0.35–5.50)

## 2022-09-29 LAB — VITAMIN D 25 HYDROXY (VIT D DEFICIENCY, FRACTURES): VITD: 49.42 ng/mL (ref 30.00–100.00)

## 2022-10-03 LAB — VITAMIN B1: Vitamin B1 (Thiamine): 17 nmol/L (ref 8–30)

## 2022-10-04 ENCOUNTER — Other Ambulatory Visit: Payer: Self-pay | Admitting: Family Medicine

## 2022-10-04 NOTE — Assessment & Plan Note (Signed)
Working with oncology, will utilize [employer]

## 2022-10-04 NOTE — Assessment & Plan Note (Signed)
Hydrate and monitor 

## 2022-10-04 NOTE — Assessment & Plan Note (Signed)
hgba1c acceptable, minimize simple carbs. Increase exercise as tolerated. Continue current meds 

## 2022-10-04 NOTE — Assessment & Plan Note (Signed)
Encourage heart healthy diet such as MIND or DASH diet, increase exercise, avoid trans fats, simple carbohydrates and processed foods, consider a krill or fish or flaxseed oil cap daily.  Tolerating Rosuvastatin 

## 2022-10-04 NOTE — Assessment & Plan Note (Signed)
Supplement and monitor 

## 2022-10-04 NOTE — Assessment & Plan Note (Signed)
Well controlled, no changes to meds. Encouraged heart healthy diet such as the DASH diet and exercise as tolerated.  °

## 2022-10-04 NOTE — Assessment & Plan Note (Signed)
Increase leafy greens, consider increased lean red meat and using cast iron cookware. Continue to monitor, report any concerns 

## 2022-10-05 ENCOUNTER — Ambulatory Visit (HOSPITAL_BASED_OUTPATIENT_CLINIC_OR_DEPARTMENT_OTHER)
Admission: RE | Admit: 2022-10-05 | Discharge: 2022-10-05 | Disposition: A | Discharge status: Home / Self Care | Payer: Medicare Other | Source: Ambulatory Visit | Attending: Family Medicine | Admitting: Family Medicine

## 2022-10-05 ENCOUNTER — Ambulatory Visit (INDEPENDENT_AMBULATORY_CARE_PROVIDER_SITE_OTHER): Payer: Medicare Other | Admitting: Family Medicine

## 2022-10-05 ENCOUNTER — Ambulatory Visit: Payer: Medicare Other | Admitting: Family Medicine

## 2022-10-05 VITALS — BP 128/64 | HR 64 | Temp 98.0°F | Resp 16 | Ht 61.0 in | Wt 167.0 lb

## 2022-10-05 DIAGNOSIS — M25552 Pain in left hip: Secondary | ICD-10-CM

## 2022-10-05 DIAGNOSIS — Z7984 Long term (current) use of oral hypoglycemic drugs: Secondary | ICD-10-CM

## 2022-10-05 DIAGNOSIS — E1169 Type 2 diabetes mellitus with other specified complication: Secondary | ICD-10-CM | POA: Diagnosis not present

## 2022-10-05 DIAGNOSIS — E2839 Other primary ovarian failure: Secondary | ICD-10-CM | POA: Diagnosis not present

## 2022-10-05 DIAGNOSIS — E559 Vitamin D deficiency, unspecified: Secondary | ICD-10-CM | POA: Diagnosis not present

## 2022-10-05 DIAGNOSIS — D500 Iron deficiency anemia secondary to blood loss (chronic): Secondary | ICD-10-CM | POA: Diagnosis not present

## 2022-10-05 DIAGNOSIS — N289 Disorder of kidney and ureter, unspecified: Secondary | ICD-10-CM | POA: Diagnosis not present

## 2022-10-05 DIAGNOSIS — C50411 Malignant neoplasm of upper-outer quadrant of right female breast: Secondary | ICD-10-CM | POA: Diagnosis not present

## 2022-10-05 DIAGNOSIS — Z780 Asymptomatic menopausal state: Secondary | ICD-10-CM | POA: Insufficient documentation

## 2022-10-05 DIAGNOSIS — E7800 Pure hypercholesterolemia, unspecified: Secondary | ICD-10-CM | POA: Diagnosis not present

## 2022-10-05 DIAGNOSIS — I10 Essential (primary) hypertension: Secondary | ICD-10-CM

## 2022-10-05 DIAGNOSIS — E669 Obesity, unspecified: Secondary | ICD-10-CM

## 2022-10-05 NOTE — Patient Instructions (Signed)
Breast Cancer, Female  Breast cancer is a malignant growth of tissue (tumor) in the breast. Unlike noncancerous (benign) tumors, malignant tumors are cancerous and can spread to other parts of the body. The two most common types of breast cancer start in the milk ducts (ductal carcinoma) or in the lobules where milk is made in the breast (lobular carcinoma). Breast cancer is one of the most common types of cancer in women. What are the causes? The exact cause of female breast cancer is unknown. What increases the risk? The following factors may make you more likely to develop this condition: Being older than 81 years of age. Having a family history of breast cancer. Starting menopause after age 68. Starting your menstrual periods before age 32. Having never been pregnant or having your first child after age 23. Having never breastfed. A personal history of: Breast cancer. Dense breast tissue. Radiation exposure. Having the BRCA1 and BRCA2 genes. Having certain types of benign breast conditions. Exposure to the drug DES, which was given to pregnant women from the 1940s to the 1970s. Other risks include: Using birth control pills. Using hormone therapy after menopause. Drinking more than one alcoholic drink a day. Obesity. What are the signs or symptoms? Symptoms of this condition include: A painless lump or thickening in your breast. Changes in the size or shape of your breast. Breast skin changes, such as puckering or dimpling. Nipple abnormalities, such as scaling, crustiness, redness, or pulling in (retraction). Nipple discharge that is bloody or clear. How is this diagnosed? This condition may be diagnosed by: Taking your medical history and doing a physical exam. During the exam, your health care provider will feel the tissue around your breast and under your arms. Taking a sample of nipple discharge. The sample will be examined under a microscope. Performing imaging tests,  such as breast X-rays (mammogram), ultrasound, or MRI. Taking a tissue sample (biopsy) from the breast. The sample will be examined under a microscope to look for cancer cells. Taking a sample from the lymph nodes near the affected breast (sentinel node biopsy). Your cancer will be staged to determine its severity and extent. Staging is a careful attempt to find out the size of the tumor, whether the cancer has spread, and if so, to what parts of the body. Staging also includes testing your tumor for certain receptors, such as estrogen, progesterone, and human epidermal growth factor receptor 2 (HER2). This will help your cancer care team decide on a treatment that will work best for you. You may need to have more tests to determine the stage of your cancer. Stages include the following: Stage 0--The tumor has not spread to other breast tissue. Stage 1 (I)--The cancer is only found in the breast or may be in the lymph nodes. The tumor may be up to  inch (2 cm) wide. Stage 2 (II)--The cancer has spread to nearby lymph nodes. The tumor may be up to 2 inches (5 cm) wide. Stage 3 (III)--The cancer has spread to more distant lymph nodes. The tumor may be larger than 2 inches (5 cm) wide. Stage 4 (IV)--The cancer has spread to other parts of the body, such as the bones, brain, liver, or lungs. How is this treated? Treatment for this condition depends on the type and stage of the breast cancer. It may be treated with: Surgery. This may involve breast-conserving surgery (lumpectomy or partial mastectomy) in which only the part of the breast containing the cancer is removed. Some  normal tissue surrounding this area may also be removed. In some cases, surgery may be done to remove the entire breast (mastectomy) and nipple. Lymph nodes may also be removed. Radiation therapy, which uses high-energy rays to kill cancer cells. Chemotherapy, which is the use of medicines to kill cancer cells. Hormone therapy, which  involves taking medicine to adjust the hormone levels in your body. You may take medicine to decrease your estrogen levels. This can help stop cancer cells from growing. Targeted therapy, in which medicines are used to block the growth and spread of cancer cells. These medicines target a specific part of the cancer cell and usually cause fewer side effects than chemotherapy. Targeted therapy may be used alone or in combination with chemotherapy. Immunotherapy, which is the use of medicines to boost the immune system to recognize and destroy cancer cells more effectively. A combination of surgery, radiation, chemotherapy, or hormone therapy may be needed to treat breast cancer. Follow these instructions at home: Take over-the-counter and prescription medicines only as told by your health care provider. Eat a healthy diet. A healthy diet includes lots of fruits and vegetables, low-fat dairy products, lean meats, and fiber. Make sure half your plate is filled with fruits or vegetables. Choose high-fiber foods such as whole-grain breads and cereals. Consider joining a support group. This may help you cope with the stress of having breast cancer. Talk to your health care team about exercise and physical activity. The right exercise program can: Help prevent or reduce symptoms such as fatigue or depression. Improve overall health and survival rates. Keep all follow-up visits. This is important. Where to find more information American Cancer Society: www.cancer.org National Cancer Institute: www.cancer.gov Contact a health care provider if: You have a sudden increase in pain. You have any symptoms or changes that concern you. You lose weight without trying. You notice a new lump in either breast or under your arm. You develop swelling in either arm or hand. You have a fever. You notice new fatigue or weakness. Get help right away if: You have chest pain or trouble breathing. These symptoms may be  an emergency. Get help right away. Call 911. Do not wait to see if the symptoms will go away. Do not drive yourself to the hospital. Summary Breast cancer is a malignant growth of tissue (tumor) in the breast. Your cancer will be staged to determine its severity and extent. Treatment for this condition depends on the type and stage of the breast cancer. This information is not intended to replace advice given to you by your health care provider. Make sure you discuss any questions you have with your health care provider. Document Revised: 12/13/2020 Document Reviewed: 12/13/2020 Elsevier Patient Education  2024 [employer].

## 2022-10-09 ENCOUNTER — Encounter: Payer: Self-pay | Admitting: Family Medicine

## 2022-10-09 NOTE — Progress Notes (Signed)
Subjective:    [name], female    DOB: July 18, 1940, 82 y.o.   MRN: [id number]  Chief Complaint  Patient presents with   Follow-up    Follow up    HPI Discussed the use of AI scribe software for clinical note transcription with the patient, who gave verbal consent to proceed.  History of Present Illness   The patient, with a known history of hyperlipidemia managed with Crestor, presents with concerns about their cholesterol levels. They report that their triglycerides and LDL cholesterol levels have increased, which they attribute to increased protein intake following recent surgery. The patient also reports persistent left hip pain that radiates down the leg, which has been present for a long time but has been worsening. They describe the leg as often being numb.  In addition, the patient has recently been diagnosed with stage 1 ER positive, PR positive breast cancer. They have chosen not to undergo recommended radiation therapy due to concerns about potential side effects, including skin sensitivity and potential damage to the bones. The patient has been prescribed Femara (letrozole) as an alternative treatment, but they express concerns about potential side effects of this medication, including osteoporosis and increased cholesterol levels.        Past Medical History:  Diagnosis Date   Abdominal pain in female 03/18/2010   Qualifier: Diagnosis of  By: Leone Payor MD, Charlyne Quale    Anemia 06/08/2014   Anxiety    Arthritis    Spinal Osteoarthritis   Cancer (HCC)    Carcinoid tumor of stomach    Cataract    Chest pain    Myoview 12/15 no ischemia.   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 09/05/2006   Qualifier: Diagnosis of  By: Charlsie Quest RMA, Lucy     Diabetic peripheral neuropathy (HCC) 10/29/2013   Encounter for preventative adult health care exam with abnormal findings  09/14/2013   Esophageal reflux    Gastric polyp    Fundic Gland   Gastroparesis    Headache(784.0)    Heart murmur    Echocardiogram 2/11: EF 60-65%, mild LAE, grade 1 diastolic dysfunction, aortic valve sclerosis, mean gradient 9 mm of mercury, PASP 34   Hematuria 03/16/2016   Iron deficiency anemia, unspecified    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   Leg swelling    bilateral   Neck pain 04/22/2015   PONV (postoperative nausea and vomiting)    pt states body temperature drops every time she has anesthesia; pt states only needs small amount of anesthesia   PSVT (paroxysmal supraventricular tachycardia)    Pure hypercholesterolemia    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   Renal insufficiency 06/18/2019   pt states  L kidney function very low-functions at about 20%   Stroke [facility])    tia, 2014   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   Type II or unspecified type diabetes mellitus without mention of complication, not stated as uncontrolled    Unspecified essential hypertension    Unspecified hereditary and idiopathic peripheral neuropathy 10/29/2013    Past Surgical History:  Procedure Laterality Date   BREAST BIOPSY Right 06/13/2022   [place] RT BREAST BX W LOC DEV 1ST LESION IMG BX SPEC US GUIDE 06/13/2022 [facility]MO[facility]   BREAST LUMPECTOMY Right 07/31/2022   Procedure: RIGHT BREAST LUMPECTOMY;  Surgeon: Abigail Miyamoto, MD;  Location: [facility];  Service: General;  Laterality: Right;  CHOLECYSTECTOMY  1993   COLONOSCOPY  11/11/2010   diverticulosis   DILATATION & CURRETTAGE/HYSTEROSCOPY WITH RESECTOCOPE N/A 02/25/2013   Procedure: Attempted hysteroscopy with uterine perforation;  Surgeon: Jacqualin Combes de Gwenevere Ghazi, MD;  Location: [facility];  Service: Gynecology;  Laterality: N/A;   ESOPHAGOGASTRODUODENOSCOPY  08/29/2010; 09/15/2010   Carcinoid tumor less than 1 cm in July 2012 not seen in August 2012 , gastritis, fundic gland polyps   ESOPHAGOGASTRODUODENOSCOPY  05/16/2011    ESOPHAGOGASTRODUODENOSCOPY  06/14/2012   EUS  12/15/2010   Procedure: UPPER ENDOSCOPIC ULTRASOUND (EUS) LINEAR;  Surgeon: Rob Bunting, MD;  Location: [facility];  Service: Endoscopy;  Laterality: N/A;   EYE SURGERY Bilateral    Bi lateral cateracts and bi lateral laser   LAPAROSCOPY N/A 02/25/2013   Procedure: Cystoscopy and laparoscopy with fulguration of uterine serosa;  Surgeon: Jacqualin Combes de Gwenevere Ghazi, MD;  Location: [facility];  Service: Gynecology;  Laterality: N/A;   TONSILLECTOMY      Family History  Problem Relation Age of Onset   Diabetes Mother    Stroke Father        deceased age 28   Heart disease Sister        deceased MI age 30   Diabetes Sister    Heart disease Sister    Hypertension Sister    Hyperlipidemia Sister    Diabetes Sister    Heart disease Sister    Hypertension Sister    Hyperlipidemia Sister    Heart disease Brother        deceased MI age 65   Intellectual disability Brother    Diabetes Brother    Heart disease Brother    Hypertension Brother    Hyperlipidemia Brother    Diabetes Maternal Grandmother    Hypertension Paternal Grandmother    Colon cancer Neg Hx    Esophageal cancer Neg Hx    Stomach cancer Neg Hx    Rectal cancer Neg Hx     Social History   Socioeconomic History   Marital status: Widowed    Spouse name: Not on file   Number of children: 0   Years of education: college   Highest education level: Not on file  Occupational History   Occupation: retired  Tobacco Use   Smoking status: Never   Smokeless tobacco: Never   Tobacco comments:    Never used tobacco  Vaping Use   Vaping status: Never Used  Substance and Sexual Activity   Alcohol use: No    Alcohol/week: 0.0 standard drinks of alcohol   Drug use: No   Sexual activity: Never    Partners: Male    Birth control/protection: Post-menopausal    Comment: lives alone, no dietary restrictions except avoid fresh veg, fruit, whole grains  Other Topics  Concern   Not on file  Social History Narrative   Patient was married ([name]) - widow   Patient does not have any children.   Patient is right-handed.   Patient has a BA degree.   One caffeine drink daily    Social Determinants of Health   Financial Resource Strain: Low Risk  (09/07/2022)   Overall Financial Resource Strain (CARDIA)    Difficulty of Paying Living Expenses: Not very hard  Food Insecurity: No Food Insecurity (06/30/2022)   Hunger Vital Sign    Worried About Running Out of Food in the Last Year: Never true    Ran Out of Food in the Last Year: Never true  Transportation Needs: No Transportation Needs (06/30/2022)   PRAPARE - Administrator, Civil Service (Medical): No    Lack of Transportation (Non-Medical): No  Recent Concern: Transportation Needs - Unmet Transportation Needs (06/30/2022)   PRAPARE - Administrator, Civil Service (Medical): Yes    Lack of Transportation (Non-Medical): No  Physical Activity: Inactive (09/07/2022)   Exercise Vital Sign    Days of Exercise per Week: 0 days    Minutes of Exercise per Session: 0 min  Stress: No Stress Concern Present (09/07/2022)   [employer] of Occupational Health - Occupational Stress Questionnaire    Feeling of Stress : Only a little  Recent Concern: Stress - Stress Concern Present (07/10/2022)   [employer] of Occupational Health - Occupational Stress Questionnaire    Feeling of Stress : To some extent  Social Connections: Moderately Isolated (09/07/2022)   Social Connection and Isolation Panel [NHANES]    Frequency of Communication with Friends and Family: More than three times a week    Frequency of Social Gatherings with Friends and Family: Once a week    Attends Religious Services: More than 4 times per year    Active Member of [employer] or Organizations: No    Attends Banker Meetings: Never    Marital Status: Widowed  Intimate Partner Violence: Not At Risk (06/30/2022)    Humiliation, Afraid, Rape, and Kick questionnaire    Fear of Current or Ex-Partner: No    Emotionally Abused: No    Physically Abused: No    Sexually Abused: No    Outpatient Medications Prior to Visit  Medication Sig Dispense Refill   acetaminophen (TYLENOL) 325 MG tablet Take 2 tablets (650 mg total) by mouth every 6 (six) hours as needed for up to 30 doses for mild pain or moderate pain. 30 tablet 0   amLODipine (NORVASC) 5 MG tablet TAKE 1 TABLET BY MOUTH DAILY 100 tablet 2   aspirin EC 81 MG tablet Take 81 mg by mouth daily. Swallow whole.     betamethasone valerate ointment (VALISONE) 0.1 % Apply a small amount to affected area topically every other day. 45 g 1   Blood Glucose Monitoring Suppl (ONE TOUCH ULTRA 2) w/Device KIT USE TO CHECK BLOOD SUGAR AS DIRECTED 1 kit 0   calcium-vitamin D (OSCAL WITH D) 500-5 MG-MCG tablet Take 1 tablet by mouth daily.     cholecalciferol (VITAMIN D) 1000 UNITS tablet Take 1,000 Units by mouth daily.     furosemide (LASIX) 20 MG tablet TAKE 1 TABLET BY MOUTH DAILY AS  NEEDED 100 tablet 2   Lancets (ONETOUCH ULTRASOFT) lancets USE AS DIRECTED 3 TIMES  DAILY 300 each 3   letrozole (FEMARA) 2.5 MG tablet Take 1 tablet (2.5 mg total) by mouth daily. 90 tablet 3   losartan (COZAAR) 50 MG tablet TAKE 1 TABLET BY MOUTH TWICE  DAILY 200 tablet 2   metFORMIN (GLUCOPHAGE-XR) 500 MG 24 hr tablet TAKE 1 TABLET BY MOUTH IN THE  MORNING AND 1 TABLET BY MOUTH AT BEDTIME 200 tablet 2   metoCLOPramide (REGLAN) 5 MG tablet TAKE 1 TABLET BY MOUTH TWICE  DAILY AS NEEDED 180 tablet 0   metoprolol tartrate (LOPRESSOR) 50 MG tablet Take 1 tablet (50 mg total) by mouth 2 (two) times daily. 180 tablet 3   Multiple Vitamin (MULTIVITAMIN ADULT PO) Take by mouth.     NEXIUM 40 MG capsule Take 1 capsule (40 mg total) by mouth daily  before breakfast. 90 capsule 3   ondansetron (ZOFRAN) 4 MG tablet Take 4 mg by mouth every 4 (four) hours as needed. For nausea     ONETOUCH ULTRA  test strip CHECK BLOOD SUGAR 3 TIMES DAILY  OR AS NEEDED 300 strip 2   potassium chloride (KLOR-CON M) 10 MEQ tablet TAKE 1 TABLET BY MOUTH DAILY 100 tablet 2   rosuvastatin (CRESTOR) 20 MG tablet TAKE 1 TABLET BY MOUTH DAILY 100 tablet 2   thiamine (VITAMIN B-1) 100 MG tablet Take 100 mg by mouth every other day.     vitamin E 180 MG (400 UNITS) capsule Take 400 Units by mouth daily.     No facility-administered medications prior to visit.    Allergies  Allergen Reactions   Tramadol Other (See Comments)    Dizziness     Review of Systems  Constitutional:  Positive for malaise/fatigue. Negative for fever.  HENT:  Negative for congestion.   Eyes:  Negative for blurred vision.  Respiratory:  Negative for shortness of breath.   Cardiovascular:  Negative for chest pain, palpitations and leg swelling.  Gastrointestinal:  Negative for abdominal pain, blood in stool and nausea.  Genitourinary:  Negative for dysuria and frequency.  Musculoskeletal:  Negative for falls.  Skin:  Negative for rash.  Neurological:  Negative for dizziness, loss of consciousness and headaches.  Endo/Heme/Allergies:  Negative for environmental allergies.  Psychiatric/Behavioral:  Negative for depression. The patient is not nervous/anxious.        Objective:    Physical Exam Constitutional:      General: She is not in acute distress.    Appearance: Normal appearance. She is well-developed. She is not toxic-appearing.  HENT:     Head: Normocephalic and atraumatic.     Right Ear: External ear normal.     Left Ear: External ear normal.     Nose: Nose normal.  Eyes:     General:        Right eye: No discharge.        Left eye: No discharge.     Conjunctiva/sclera: Conjunctivae normal.  Neck:     Thyroid: No thyromegaly.  Cardiovascular:     Rate and Rhythm: Normal rate and regular rhythm.     Heart sounds: Normal heart sounds. No murmur heard. Pulmonary:     Effort: Pulmonary effort is normal. No  respiratory distress.     Breath sounds: Normal breath sounds.  Abdominal:     General: Bowel sounds are normal.     Palpations: Abdomen is soft.     Tenderness: There is no abdominal tenderness. There is no guarding.  Musculoskeletal:        General: Normal range of motion.     Cervical back: Neck supple.  Lymphadenopathy:     Cervical: No cervical adenopathy.  Skin:    General: Skin is warm and dry.  Neurological:     Mental Status: She is alert and oriented to person, place, and time.  Psychiatric:        Mood and Affect: Mood normal.        Behavior: Behavior normal.        Thought Content: Thought content normal.        Judgment: Judgment normal.     BP 128/64 (BP Location: Left Arm, Patient Position: Sitting, Cuff Size: Normal)   Pulse 64   Temp 98 F (36.7 C) (Oral)   Resp 16   Ht 5\' 1"  (1.549 m)  Wt 167 lb (75.8 kg)   LMP 02/07/1992   SpO2 98%   BMI 31.55 kg/m  Wt Readings from Last 3 Encounters:  10/05/22 167 lb (75.8 kg)  09/07/22 164 lb 6.4 oz (74.6 kg)  09/06/22 164 lb 6.4 oz (74.6 kg)    Diabetic Foot Exam - Simple   No data filed    Lab Results  Component Value Date   WBC 8.2 09/28/2022   HGB 12.6 09/28/2022   HCT 38.0 09/28/2022   PLT 236.0 09/28/2022   GLUCOSE 125 (H) 09/28/2022   CHOL 227 (H) 09/28/2022   TRIG 171.0 (H) 09/28/2022   HDL 60.40 09/28/2022   LDLDIRECT 95.0 03/25/2020   LDLCALC 133 (H) 09/28/2022   ALT 18 09/28/2022   AST 19 09/28/2022   NA 134 (L) 09/28/2022   K 4.4 09/28/2022   CL 96 09/28/2022   CREATININE 0.85 09/28/2022   BUN 24 (H) 09/28/2022   CO2 28 09/28/2022   TSH 2.54 09/28/2022   INR 0.88 01/16/2011   HGBA1C 6.6 (H) 09/28/2022   MICROALBUR 2.8 (H) 07/05/2021    Lab Results  Component Value Date   TSH 2.54 09/28/2022   Lab Results  Component Value Date   WBC 8.2 09/28/2022   HGB 12.6 09/28/2022   HCT 38.0 09/28/2022   MCV 85.7 09/28/2022   PLT 236.0 09/28/2022   Lab Results  Component Value  Date   NA 134 (L) 09/28/2022   K 4.4 09/28/2022   CHLORIDE 101 07/08/2015   CO2 28 09/28/2022   GLUCOSE 125 (H) 09/28/2022   BUN 24 (H) 09/28/2022   CREATININE 0.85 09/28/2022   BILITOT 0.6 09/28/2022   ALKPHOS 60 09/28/2022   AST 19 09/28/2022   ALT 18 09/28/2022   PROT 6.9 09/28/2022   ALBUMIN 4.3 09/28/2022   CALCIUM 9.8 09/28/2022   ANIONGAP 12 08/29/2022   EGFR 75 (L) 07/08/2015   GFR 64.22 09/28/2022   Lab Results  Component Value Date   CHOL 227 (H) 09/28/2022   Lab Results  Component Value Date   HDL 60.40 09/28/2022   Lab Results  Component Value Date   LDLCALC 133 (H) 09/28/2022   Lab Results  Component Value Date   TRIG 171.0 (H) 09/28/2022   Lab Results  Component Value Date   CHOLHDL 4 09/28/2022   Lab Results  Component Value Date   HGBA1C 6.6 (H) 09/28/2022       Assessment & Plan:  Type 2 diabetes mellitus with obesity (HCC) Assessment & Plan: hgba1c acceptable, minimize simple carbs. Increase exercise as tolerated. Continue current meds  Orders: -     Hemoglobin A1c; Future -     Microalbumin / creatinine urine ratio; Future  Vitamin D deficiency Assessment & Plan: Supplement and monitor   Orders: -     VITAMIN D 25 Hydroxy (Vit-D Deficiency, Fractures); Future  Renal insufficiency Assessment & Plan: Hydrate and monitor   Orders: -     Comprehensive metabolic panel; Future  Iron deficiency anemia due to chronic blood loss Assessment & Plan: Increase leafy greens, consider increased lean red meat and using cast iron cookware. Continue to monitor, report any concerns   HYPERCHOLESTEROLEMIA Assessment & Plan: Encourage heart healthy diet such as MIND or DASH diet, increase exercise, avoid trans fats, simple carbohydrates and processed foods, consider a krill or fish or flaxseed oil cap daily. Tolerating Rosuvastatin  Orders: -     Lipid panel; Future  Essential hypertension Assessment & Plan: Well  controlled, no changes  to meds. Encouraged heart healthy diet such as the DASH diet and exercise as tolerated.    Orders: -     CBC with Differential/Platelet; Future -     TSH; Future  Carcinoma of breast upper outer quadrant, right [facility]) Assessment & Plan: Working with oncology, will utilize Femara   Estrogen deficiency -     DG Bone Density; Future  Post-menopausal -     DG Bone Density; Future  Left hip pain -     DG HIP UNILAT W OR W/O PELVIS 2-3 VIEWS LEFT; Future    Assessment and Plan    Hyperlipidemia Elevated VLDL and LDL. Discussed the importance of diet, exercise, and medication adherence in managing cholesterol levels. -Continue current statin therapy. -Encouraged to maintain a diet low in processed foods and high in lean proteins. -Recommended to increase physical activity, such as taking short walks twice a day. -Plan to recheck lipid panel in three months.  Breast Cancer (Stage 1, ER positive, PR positive) Lumpectomy performed, patient declined radiation therapy. Discussed the benefits and potential side effects of Femara (letrozole) for hormone suppression. -Consider starting Femara, with the understanding that it can be discontinued if not tolerated. -Plan to recheck bone density given potential osteoporosis risk with Femara.  Hip Pain Chronic pain, worsening recently, radiating down the leg. Possible musculoskeletal origin, potentially related to hip arthritis or sciatic nerve involvement. -Order hip x-ray to evaluate for arthritis. -Consider physical therapy or orthopedic consultation depending on x-ray results.  General Health Maintenance -Encouraged to maintain hydration and regular intake of protein and fiber for overall health. -Plan to recheck blood work in three months. -Declined COVID booster and flu shot. -Plan to follow up in three months.         Danise Edge, MD

## 2022-10-12 ENCOUNTER — Other Ambulatory Visit: Payer: Self-pay

## 2022-10-12 ENCOUNTER — Encounter: Payer: Self-pay | Admitting: Hematology & Oncology

## 2022-10-12 ENCOUNTER — Encounter: Payer: Self-pay | Admitting: *Deleted

## 2022-10-12 ENCOUNTER — Inpatient Hospital Stay: Payer: Medicare Other | Admitting: Hematology & Oncology

## 2022-10-12 ENCOUNTER — Inpatient Hospital Stay: Payer: Medicare Other | Attending: Hematology & Oncology

## 2022-10-12 VITALS — BP 153/57 | HR 62 | Temp 98.3°F | Resp 19 | Ht 61.0 in | Wt 166.0 lb

## 2022-10-12 DIAGNOSIS — Z79811 Long term (current) use of aromatase inhibitors: Secondary | ICD-10-CM | POA: Diagnosis not present

## 2022-10-12 DIAGNOSIS — C50911 Malignant neoplasm of unspecified site of right female breast: Secondary | ICD-10-CM | POA: Diagnosis not present

## 2022-10-12 DIAGNOSIS — C50411 Malignant neoplasm of upper-outer quadrant of right female breast: Secondary | ICD-10-CM | POA: Diagnosis not present

## 2022-10-12 DIAGNOSIS — K909 Intestinal malabsorption, unspecified: Secondary | ICD-10-CM

## 2022-10-12 DIAGNOSIS — Z170 Estrogen receptor positive status [ER+]: Secondary | ICD-10-CM | POA: Diagnosis not present

## 2022-10-12 LAB — CBC WITH DIFFERENTIAL (CANCER CENTER ONLY)
Abs Immature Granulocytes: 0.05 10*3/uL (ref 0.00–0.07)
Basophils Absolute: 0.1 10*3/uL (ref 0.0–0.1)
Basophils Relative: 1 %
Eosinophils Absolute: 0.1 10*3/uL (ref 0.0–0.5)
Eosinophils Relative: 1 %
HCT: 38.1 % (ref 36.0–46.0)
Hemoglobin: 12.6 g/dL (ref 12.0–15.0)
Immature Granulocytes: 1 %
Lymphocytes Relative: 32 %
Lymphs Abs: 2.9 10*3/uL (ref 0.7–4.0)
MCH: 28.4 pg (ref 26.0–34.0)
MCHC: 33.1 g/dL (ref 30.0–36.0)
MCV: 86 fL (ref 80.0–100.0)
Monocytes Absolute: 0.9 10*3/uL (ref 0.1–1.0)
Monocytes Relative: 9 %
Neutro Abs: 5.1 10*3/uL (ref 1.7–7.7)
Neutrophils Relative %: 56 %
Platelet Count: 225 10*3/uL (ref 150–400)
RBC: 4.43 MIL/uL (ref 3.87–5.11)
RDW: 12.2 % (ref 11.5–15.5)
WBC Count: 9.1 10*3/uL (ref 4.0–10.5)
nRBC: 0 % (ref 0.0–0.2)

## 2022-10-12 LAB — CMP (CANCER CENTER ONLY)
ALT: 22 U/L (ref 0–44)
AST: 22 U/L (ref 15–41)
Albumin: 4.8 g/dL (ref 3.5–5.0)
Alkaline Phosphatase: 59 U/L (ref 38–126)
Anion gap: 9 (ref 5–15)
BUN: 19 mg/dL (ref 8–23)
CO2: 27 mmol/L (ref 22–32)
Calcium: 9.9 mg/dL (ref 8.9–10.3)
Chloride: 96 mmol/L — ABNORMAL LOW (ref 98–111)
Creatinine: 0.82 mg/dL (ref 0.44–1.00)
GFR, Estimated: >60 mL/min (ref >60)
Glucose, Bld: 118 mg/dL — ABNORMAL HIGH (ref 70–99)
Potassium: 4.7 mmol/L (ref 3.5–5.1)
Sodium: 132 mmol/L — ABNORMAL LOW (ref 135–145)
Total Bilirubin: 0.7 mg/dL (ref 0.3–1.2)
Total Protein: 7.5 g/dL (ref 6.5–8.1)

## 2022-10-12 LAB — IRON AND IRON BINDING CAPACITY (CC-WL,HP ONLY)
Iron: 68 ug/dL (ref 28–170)
Saturation Ratios: 18 % (ref 10.4–31.8)
TIBC: 379 ug/dL (ref 250–450)
UIBC: 311 ug/dL (ref 148–442)

## 2022-10-12 LAB — FERRITIN: Ferritin: 212 ng/mL (ref 11–307)

## 2022-10-12 LAB — LACTATE DEHYDROGENASE: LDH: 140 U/L (ref 98–192)

## 2022-10-12 NOTE — Progress Notes (Signed)
Hematology and Oncology Follow Up Visit  GINI ELLINGSON [id number] [birth year] 82 y.o. 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 Cumpton is back for follow-up.  She has decided not to take radiation therapy.  I know this is a somewhat controversial decision somebody who is 82 years old.  I certainly cannot argue against this.  She has not yet started Femara.  She had a lot of questions about Femara.  She did a lot of reading about Femara.  I tried to reassure her that Femara certainly would be safe for her.  I really think that she would do well with the Femara.  I told her that there could was be an allergic reaction.  I cannot remember the burden last time a woman had an allergic reaction to Femara.  She apparently had a bone density test done.  This was done on 10/05/2022.  She did not have any osteopenia.  She has had no problems with bowels or bladder.  She has had no problems with nausea or vomiting.  There is no cough or shortness of breath.  Overall, I would say that her performance status is probably ECOG 1.   Medications:  Current Outpatient Medications:    acetaminophen (TYLENOL) 325 MG tablet, Take 2 tablets (650 mg total) by mouth every 6 (six) hours as needed for up to 30 doses for mild pain or moderate pain., Disp: 30 tablet, Rfl: 0   amLODipine (NORVASC) 5 MG tablet, TAKE 1 TABLET BY MOUTH DAILY, Disp: 100 tablet, Rfl: 2   aspirin EC 81 MG tablet, Take 81 mg by mouth daily. Swallow whole., Disp: , Rfl:    betamethasone valerate ointment (VALISONE) 0.1 %, Apply a small amount to affected area topically every other day., Disp: 45 g, Rfl: 1   Blood Glucose Monitoring Suppl (ONE TOUCH ULTRA 2) w/Device KIT, USE TO CHECK BLOOD SUGAR AS DIRECTED, Disp: 1 kit,  Rfl: 0   calcium-vitamin D (OSCAL WITH D) 500-5 MG-MCG tablet, Take 1 tablet by mouth daily., Disp: , Rfl:    cholecalciferol (VITAMIN D) 1000 UNITS tablet, Take 1,000 Units by mouth daily., Disp: , Rfl:    furosemide (LASIX) 20 MG tablet, TAKE 1 TABLET BY MOUTH DAILY AS  NEEDED, Disp: 100 tablet, Rfl: 2   Lancets (ONETOUCH ULTRASOFT) lancets, USE AS DIRECTED 3 TIMES  DAILY, Disp: 300 each, Rfl: 3   letrozole (FEMARA) 2.5 MG tablet, Take 1 tablet (2.5 mg total) by mouth daily., Disp: 90 tablet, Rfl: 3   losartan (COZAAR) 50 MG tablet, TAKE 1 TABLET BY MOUTH TWICE  DAILY, Disp: 200 tablet, Rfl: 2   metFORMIN (GLUCOPHAGE-XR) 500 MG 24 hr tablet, TAKE 1 TABLET BY MOUTH IN THE  MORNING AND 1 TABLET BY MOUTH AT BEDTIME, Disp: 200 tablet, Rfl: 2   metoCLOPramide (REGLAN) 5 MG tablet, TAKE 1 TABLET BY MOUTH TWICE  DAILY AS NEEDED, Disp: 180 tablet, Rfl: 0   metoprolol tartrate (LOPRESSOR) 50 MG tablet, Take 1 tablet (50 mg total) by mouth 2 (two) times daily., Disp: 180 tablet, Rfl: 3   Multiple Vitamin (MULTIVITAMIN ADULT PO), Take by mouth., Disp: , Rfl:    NEXIUM 40 MG capsule, Take 1 capsule (40 mg total) by mouth daily before breakfast., Disp:  90 capsule, Rfl: 3   ondansetron (ZOFRAN) 4 MG tablet, Take 4 mg by mouth every 4 (four) hours as needed. For nausea, Disp: , Rfl:    ONETOUCH ULTRA test strip, CHECK BLOOD SUGAR 3 TIMES DAILY  OR AS NEEDED, Disp: 300 strip, Rfl: 2   potassium chloride (KLOR-CON M) 10 MEQ tablet, TAKE 1 TABLET BY MOUTH DAILY, Disp: 100 tablet, Rfl: 2   rosuvastatin (CRESTOR) 20 MG tablet, TAKE 1 TABLET BY MOUTH DAILY, Disp: 100 tablet, Rfl: 2   thiamine (VITAMIN B-1) 100 MG tablet, Take 100 mg by mouth every other day., Disp: , Rfl:    vitamin E 180 MG (400 UNITS) capsule, Take 400 Units by mouth daily., Disp: , Rfl:   Allergies:  Allergies  Allergen Reactions   Tramadol Other (See Comments)    Dizziness     Past Medical History, Surgical history, Social history, and  Family History were reviewed and updated.  Review of Systems: Review of Systems  Constitutional: Negative.   HENT:  Negative.    Eyes: Negative.   Respiratory: Negative.    Cardiovascular: Negative.   Gastrointestinal: Negative.   Endocrine: Negative.   Genitourinary: Negative.    Musculoskeletal: Negative.   Skin: Negative.   Neurological: Negative.   Hematological: Negative.   Psychiatric/Behavioral: Negative.      Physical Exam:  height is 5\' 1"  (1.549 m) and weight is 166 lb (75.3 kg). Her oral temperature is 98.3 F (36.8 C). Her blood pressure is 153/57 (abnormal) and her pulse is 62. Her respiration is 19 and oxygen saturation is 100%.   Wt Readings from Last 3 Encounters:  10/12/22 166 lb (75.3 kg)  10/05/22 167 lb (75.8 kg)  09/07/22 164 lb 6.4 oz (74.6 kg)    Physical Exam Vitals reviewed.  Constitutional:      Comments: Left breast with no masses, edema or erythema.  There is no left axillary adenopathy.  Right breast shows the lumpectomy incision  at about the 9 o'clock position.   There is no distinct mass.  There is no palpable right axillary adenopathy.  HENT:     Head: Normocephalic and atraumatic.  Eyes:     Pupils: Pupils are equal, round, and reactive to light.  Cardiovascular:     Rate and Rhythm: Normal rate and regular rhythm.     Heart sounds: Normal heart sounds.  Pulmonary:     Effort: Pulmonary effort is normal.     Breath sounds: Normal breath sounds.  Abdominal:     General: Bowel sounds are normal.     Palpations: Abdomen is soft.  Musculoskeletal:        General: No tenderness or deformity. Normal range of motion.     Cervical back: Normal range of motion.  Lymphadenopathy:     Cervical: No cervical adenopathy.  Skin:    General: Skin is warm and dry.     Findings: No erythema or rash.  Neurological:     Mental Status: She is alert and oriented to person, place, and time.  Psychiatric:        Behavior: Behavior normal.         Thought Content: Thought content normal.        Judgment: Judgment normal.     Lab Results  Component Value Date   WBC 9.1 10/12/2022   HGB 12.6 10/12/2022   HCT 38.1 10/12/2022   MCV 86.0 10/12/2022   PLT 225 10/12/2022     Chemistry  Component Value Date/Time   NA 132 (L) 10/12/2022 1149   NA 136 05/08/2016 1305   NA 134 (L) 07/08/2015 1149   K 4.7 10/12/2022 1149   K 4.2 05/08/2016 1305   K 4.4 07/08/2015 1149   CL 96 (L) 10/12/2022 1149   CL 102 05/08/2016 1305   CO2 27 10/12/2022 1149   CO2 26 05/08/2016 1305   CO2 25 07/08/2015 1149   BUN 19 10/12/2022 1149   BUN 16 05/08/2016 1305   BUN 15.5 07/08/2015 1149   CREATININE 0.82 10/12/2022 1149   CREATININE 0.91 08/05/2021 1415   CREATININE 0.8 07/08/2015 1149      Component Value Date/Time   CALCIUM 9.9 10/12/2022 1149   CALCIUM 9.4 05/08/2016 1305   CALCIUM 10.0 07/08/2015 1149   ALKPHOS 59 10/12/2022 1149   ALKPHOS 56 05/08/2016 1305   ALKPHOS 62 07/08/2015 1149   AST 22 10/12/2022 1149   AST 24 07/08/2015 1149   ALT 22 10/12/2022 1149   ALT 29 05/08/2016 1305   ALT 30 07/08/2015 1149   BILITOT 0.7 10/12/2022 1149   BILITOT 0.64 07/08/2015 1149      Impression and Plan: Ms. Warmuth is a very charming 82 year old [place] female.  She now has what I had to believe is going to be a stage I breast cancer.  I see nothing on the pathology so far that would consider this to be an aggressive tumor.  I think that the proliferation marker clearly is a positive factor.  She has a very good prognostic breast cancer.  Again, she has declined radiation therapy.  We will see how she does with the Femara.  Again I would think that she would do okay with Femara.  We will go ahead and plan to get her back in about a month.  If all looks good, then we will see back in her back every 3 months.      Josph Macho, MD 9/5/202412:32 PM

## 2022-10-12 NOTE — Progress Notes (Signed)
Patient ultimately decided against radiation therapy and instead went straight to AI. Plan to start Femara today.   Oncology Nurse Navigator Documentation     10/12/2022   12:30 PM  Oncology Nurse Navigator Flowsheets  Phase of Treatment AI  Aromatase Inhibitor Actual Start Date: 10/12/2022  Navigator Follow Up Date: 11/13/2022  Navigator Follow Up Reason: Follow-up Appointment  Navigator Location [facility]  Navigator Encounter Type Appt/Treatment Plan Review  Patient Visit Type MedOnc  Treatment Phase Active Tx  Barriers/Navigation Needs No Barriers At This Time  Interventions None Required  Acuity Level 1-No Barriers  Support Groups/Services Friends and Family  Time Spent with Patient 15

## 2022-10-13 ENCOUNTER — Encounter: Payer: Self-pay | Admitting: *Deleted

## 2022-11-13 ENCOUNTER — Inpatient Hospital Stay: Payer: Medicare Other | Attending: Hematology & Oncology

## 2022-11-13 ENCOUNTER — Other Ambulatory Visit (HOSPITAL_BASED_OUTPATIENT_CLINIC_OR_DEPARTMENT_OTHER): Payer: Self-pay

## 2022-11-13 ENCOUNTER — Encounter: Payer: Self-pay | Admitting: *Deleted

## 2022-11-13 ENCOUNTER — Inpatient Hospital Stay: Payer: Medicare Other | Admitting: Hematology & Oncology

## 2022-11-13 ENCOUNTER — Ambulatory Visit (HOSPITAL_BASED_OUTPATIENT_CLINIC_OR_DEPARTMENT_OTHER)
Admission: RE | Admit: 2022-11-13 | Discharge: 2022-11-13 | Disposition: A | Discharge status: Home / Self Care | Payer: Medicare Other | Source: Ambulatory Visit | Attending: Hematology & Oncology | Admitting: Hematology & Oncology

## 2022-11-13 ENCOUNTER — Encounter: Payer: Self-pay | Admitting: Hematology & Oncology

## 2022-11-13 ENCOUNTER — Encounter: Payer: Self-pay | Admitting: Family

## 2022-11-13 VITALS — BP 110/73 | HR 60 | Temp 98.8°F | Resp 20 | Ht 61.0 in | Wt 164.0 lb

## 2022-11-13 DIAGNOSIS — C50411 Malignant neoplasm of upper-outer quadrant of right female breast: Secondary | ICD-10-CM

## 2022-11-13 DIAGNOSIS — Z79811 Long term (current) use of aromatase inhibitors: Secondary | ICD-10-CM | POA: Diagnosis not present

## 2022-11-13 DIAGNOSIS — Z170 Estrogen receptor positive status [ER+]: Secondary | ICD-10-CM | POA: Insufficient documentation

## 2022-11-13 DIAGNOSIS — C50911 Malignant neoplasm of unspecified site of right female breast: Secondary | ICD-10-CM | POA: Insufficient documentation

## 2022-11-13 DIAGNOSIS — R059 Cough, unspecified: Secondary | ICD-10-CM | POA: Diagnosis not present

## 2022-11-13 LAB — CBC WITH DIFFERENTIAL (CANCER CENTER ONLY)
Abs Immature Granulocytes: 0.03 10*3/uL (ref 0.00–0.07)
Basophils Absolute: 0.1 10*3/uL (ref 0.0–0.1)
Basophils Relative: 1 %
Eosinophils Absolute: 0.1 10*3/uL (ref 0.0–0.5)
Eosinophils Relative: 1 %
HCT: 38.3 % (ref 36.0–46.0)
Hemoglobin: 12.8 g/dL (ref 12.0–15.0)
Immature Granulocytes: 0 %
Lymphocytes Relative: 33 %
Lymphs Abs: 2.8 10*3/uL (ref 0.7–4.0)
MCH: 28.3 pg (ref 26.0–34.0)
MCHC: 33.4 g/dL (ref 30.0–36.0)
MCV: 84.5 fL (ref 80.0–100.0)
Monocytes Absolute: 0.8 10*3/uL (ref 0.1–1.0)
Monocytes Relative: 9 %
Neutro Abs: 4.7 10*3/uL (ref 1.7–7.7)
Neutrophils Relative %: 56 %
Platelet Count: 239 10*3/uL (ref 150–400)
RBC: 4.53 MIL/uL (ref 3.87–5.11)
RDW: 12.1 % (ref 11.5–15.5)
WBC Count: 8.4 10*3/uL (ref 4.0–10.5)
nRBC: 0 % (ref 0.0–0.2)

## 2022-11-13 LAB — CMP (CANCER CENTER ONLY)
ALT: 23 U/L (ref 0–44)
AST: 22 U/L (ref 15–41)
Albumin: 4.5 g/dL (ref 3.5–5.0)
Alkaline Phosphatase: 62 U/L (ref 38–126)
Anion gap: 8 (ref 5–15)
BUN: 21 mg/dL (ref 8–23)
CO2: 28 mmol/L (ref 22–32)
Calcium: 9.5 mg/dL (ref 8.9–10.3)
Chloride: 95 mmol/L — ABNORMAL LOW (ref 98–111)
Creatinine: 0.91 mg/dL (ref 0.44–1.00)
GFR, Estimated: >60 mL/min (ref >60)
Glucose, Bld: 156 mg/dL — ABNORMAL HIGH (ref 70–99)
Potassium: 4.5 mmol/L (ref 3.5–5.1)
Sodium: 131 mmol/L — ABNORMAL LOW (ref 135–145)
Total Bilirubin: 0.7 mg/dL (ref 0.3–1.2)
Total Protein: 7.3 g/dL (ref 6.5–8.1)

## 2022-11-13 LAB — VITAMIN D 25 HYDROXY (VIT D DEFICIENCY, FRACTURES): Vit D, 25-Hydroxy: 35.87 ng/mL (ref 30–100)

## 2022-11-13 MED ORDER — FLUAD 0.5 ML IM SUSY
0.5000 mL | PREFILLED_SYRINGE | Freq: Once | INTRAMUSCULAR | 0 refills | Status: AC
  Filled 2022-11-13: qty 0.5, 1d supply, fill #0

## 2022-11-13 NOTE — Progress Notes (Signed)
Patient has fair tolerance of AI. She does c/o hot flashes, but at this time is not interested in medical intervention. She will continue on same regimen at this time.  Oncology Nurse Navigator Documentation     11/13/2022    2:00 PM  Oncology Nurse Navigator Flowsheets  Navigator Follow Up Date: 01/22/2023  Navigator Follow Up Reason: Follow-up Appointment  Navigator Location [facility]  Navigator Encounter Type Appt/Treatment Plan Review  Patient Visit Type MedOnc  Treatment Phase Active Tx  Barriers/Navigation Needs No Barriers At This Time  Interventions None Required  Acuity Level 1-No Barriers  Support Groups/Services Friends and Family  Time Spent with Patient 15

## 2022-11-13 NOTE — Progress Notes (Signed)
Hematology and Oncology Follow Up Visit  [name] [id number] [birth year] 82 y.o. 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  She is on Femara now.  She says that with Femara, she is having some hot flashes.  I told her that this is not a surprise.  She does not wear anything for the hot flashes right now.  She has had no issues with nausea or vomiting.  She has had no change in bowel or bladder habits.  She has had no rashes.  There has been no leg swelling.  She has had no bleeding.  She has had no fever.  Overall, I would say that her performance status is probably ECOG 1.     Medications:  Current Outpatient Medications:    acetaminophen (TYLENOL) 325 MG tablet, Take 2 tablets (650 mg total) by mouth every 6 (six) hours as needed for up to 30 doses for mild pain or moderate pain., Disp: 30 tablet, Rfl: 0   amLODipine (NORVASC) 5 MG tablet, TAKE 1 TABLET BY MOUTH DAILY, Disp: 100 tablet, Rfl: 2   aspirin EC 81 MG tablet, Take 81 mg by mouth daily. Swallow whole., Disp: , Rfl:    Blood Glucose Monitoring Suppl (ONE TOUCH ULTRA 2) w/Device KIT, USE TO CHECK BLOOD SUGAR AS DIRECTED, Disp: 1 kit, Rfl: 0   calcium-vitamin D (OSCAL WITH D) 500-5 MG-MCG tablet, Take 1 tablet by mouth daily., Disp: , Rfl:    cholecalciferol (VITAMIN D) 1000 UNITS tablet, Take 1,000 Units by mouth daily., Disp: , Rfl:    furosemide (LASIX) 20 MG tablet, TAKE 1 TABLET BY MOUTH DAILY AS  NEEDED, Disp: 100 tablet, Rfl: 2   Lancets (ONETOUCH ULTRASOFT) lancets, USE AS DIRECTED 3 TIMES  DAILY, Disp: 300 each, Rfl: 3   letrozole (FEMARA) 2.5 MG tablet, Take 1 tablet (2.5 mg total) by mouth daily., Disp: 90 tablet, Rfl: 3   losartan (COZAAR) 50 MG tablet, TAKE 1  TABLET BY MOUTH TWICE  DAILY, Disp: 200 tablet, Rfl: 2   metFORMIN (GLUCOPHAGE-XR) 500 MG 24 hr tablet, TAKE 1 TABLET BY MOUTH IN THE  MORNING AND 1 TABLET BY MOUTH AT BEDTIME, Disp: 200 tablet, Rfl: 2   metoCLOPramide (REGLAN) 5 MG tablet, TAKE 1 TABLET BY MOUTH TWICE  DAILY AS NEEDED, Disp: 180 tablet, Rfl: 0   metoprolol tartrate (LOPRESSOR) 50 MG tablet, Take 1 tablet (50 mg total) by mouth 2 (two) times daily., Disp: 180 tablet, Rfl: 3   Multiple Vitamin (MULTIVITAMIN ADULT PO), Take by mouth., Disp: , Rfl:    NEXIUM 40 MG capsule, Take 1 capsule (40 mg total) by mouth daily before breakfast., Disp: 90 capsule, Rfl: 3   ONETOUCH ULTRA test strip, CHECK BLOOD SUGAR 3 TIMES DAILY  OR AS NEEDED, Disp: 300 strip, Rfl: 2   potassium chloride (KLOR-CON M) 10 MEQ tablet, TAKE 1 TABLET BY MOUTH DAILY, Disp: 100 tablet, Rfl: 2   rosuvastatin (CRESTOR) 20 MG tablet, TAKE 1 TABLET BY MOUTH DAILY, Disp: 100 tablet, Rfl: 2   thiamine (VITAMIN B-1) 100 MG tablet, Take 100 mg by mouth every other day., Disp: , Rfl:    vitamin E 180 MG (400 UNITS) capsule, Take 400 Units by mouth  daily., Disp: , Rfl:    betamethasone valerate ointment (VALISONE) 0.1 %, Apply a small amount to affected area topically every other day. (Patient not taking: Reported on 11/13/2022), Disp: 45 g, Rfl: 1   ondansetron (ZOFRAN) 4 MG tablet, Take 4 mg by mouth every 4 (four) hours as needed. For nausea (Patient not taking: Reported on 11/13/2022), Disp: , Rfl:   Allergies:  Allergies  Allergen Reactions   Tramadol Other (See Comments)    Dizziness     Past Medical History, Surgical history, Social history, and Family History were reviewed and updated.  Review of Systems: Review of Systems  Constitutional: Negative.   HENT:  Negative.    Eyes: Negative.   Respiratory: Negative.    Cardiovascular: Negative.   Gastrointestinal: Negative.   Endocrine: Negative.   Genitourinary: Negative.    Musculoskeletal: Negative.   Skin:  Negative.   Neurological: Negative.   Hematological: Negative.   Psychiatric/Behavioral: Negative.      Physical Exam:  height is 5\' 1"  (1.549 m) and weight is 164 lb 0.6 oz (74.4 kg). Her oral temperature is 98.8 F (37.1 C). Her blood pressure is 110/73 and her pulse is 60. Her respiration is 20 and oxygen saturation is 99%.   Wt Readings from Last 3 Encounters:  11/13/22 164 lb 0.6 oz (74.4 kg)  10/12/22 166 lb (75.3 kg)  10/05/22 167 lb (75.8 kg)    Physical Exam Vitals reviewed.  Constitutional:      Comments: Left breast with no masses, edema or erythema.  There is no left axillary adenopathy.  Right breast shows the lumpectomy incision  at about the 9 o'clock position.   There is no distinct mass.  There is no palpable right axillary adenopathy.  HENT:     Head: Normocephalic and atraumatic.  Eyes:     Pupils: Pupils are equal, round, and reactive to light.  Cardiovascular:     Rate and Rhythm: Normal rate and regular rhythm.     Heart sounds: Normal heart sounds.  Pulmonary:     Effort: Pulmonary effort is normal.     Breath sounds: Normal breath sounds.  Abdominal:     General: Bowel sounds are normal.     Palpations: Abdomen is soft.  Musculoskeletal:        General: No tenderness or deformity. Normal range of motion.     Cervical back: Normal range of motion.  Lymphadenopathy:     Cervical: No cervical adenopathy.  Skin:    General: Skin is warm and dry.     Findings: No erythema or rash.  Neurological:     Mental Status: She is alert and oriented to person, place, and time.  Psychiatric:        Behavior: Behavior normal.        Thought Content: Thought content normal.        Judgment: Judgment normal.     Lab Results  Component Value Date   WBC 8.4 11/13/2022   HGB 12.8 11/13/2022   HCT 38.3 11/13/2022   MCV 84.5 11/13/2022   PLT 239 11/13/2022     Chemistry      Component Value Date/Time   NA 131 (L) 11/13/2022 1128   NA 136 05/08/2016 1305    NA 134 (L) 07/08/2015 1149   K 4.5 11/13/2022 1128   K 4.2 05/08/2016 1305   K 4.4 07/08/2015 1149   CL 95 (L) 11/13/2022 1128   CL 102 05/08/2016 1305   CO2  28 11/13/2022 1128   CO2 26 05/08/2016 1305   CO2 25 07/08/2015 1149   BUN 21 11/13/2022 1128   BUN 16 05/08/2016 1305   BUN 15.5 07/08/2015 1149   CREATININE 0.91 11/13/2022 1128   CREATININE 0.91 08/05/2021 1415   CREATININE 0.8 07/08/2015 1149      Component Value Date/Time   CALCIUM 9.5 11/13/2022 1128   CALCIUM 9.4 05/08/2016 1305   CALCIUM 10.0 07/08/2015 1149   ALKPHOS 62 11/13/2022 1128   ALKPHOS 56 05/08/2016 1305   ALKPHOS 62 07/08/2015 1149   AST 22 11/13/2022 1128   AST 24 07/08/2015 1149   ALT 23 11/13/2022 1128   ALT 29 05/08/2016 1305   ALT 30 07/08/2015 1149   BILITOT 0.7 11/13/2022 1128   BILITOT 0.64 07/08/2015 1149      Impression and Plan: Ms. Nimmer is a very charming 82 year old [place] female.  She now has what I had to believe is going to be a stage I breast cancer.  I see nothing on the pathology so far that would consider this to be an aggressive tumor.  I think that the proliferation marker clearly is a positive factor.  She has a very good prognostic breast cancer.  Again, she has declined radiation therapy.  We will see how she does with the Femara.  Again I would think that she would do okay with Femara.  I would like to get her back in about 2 months.  If everything looks good in 2 months, then we will see about getting her through the entire Winter.  If she ever needs anything for the hot flashes, I probably would get her on a low-dose Megace.     Josph Macho, MD 10/7/202412:45 PM

## 2022-11-16 ENCOUNTER — Telehealth: Payer: Self-pay | Admitting: *Deleted

## 2022-11-16 NOTE — Telephone Encounter (Signed)
Message received from patient requesting CXR results.  Call placed back to patient and patient notified per order of Dr. Myna Hidalgo that CXR is clear.  Pt is appreciative of call and has no questions or concerns at this time.

## 2022-11-21 ENCOUNTER — Encounter: Payer: Self-pay | Admitting: Internal Medicine

## 2022-11-21 ENCOUNTER — Ambulatory Visit: Payer: Medicare Other | Admitting: Internal Medicine

## 2022-11-21 VITALS — BP 120/60 | HR 68 | Ht 59.5 in | Wt 165.0 lb

## 2022-11-21 DIAGNOSIS — K30 Functional dyspepsia: Secondary | ICD-10-CM

## 2022-11-21 DIAGNOSIS — R109 Unspecified abdominal pain: Secondary | ICD-10-CM | POA: Diagnosis not present

## 2022-11-21 DIAGNOSIS — G8929 Other chronic pain: Secondary | ICD-10-CM | POA: Diagnosis not present

## 2022-11-21 DIAGNOSIS — K219 Gastro-esophageal reflux disease without esophagitis: Secondary | ICD-10-CM | POA: Diagnosis not present

## 2022-11-21 DIAGNOSIS — M940 Chondrocostal junction syndrome [Tietze]: Secondary | ICD-10-CM

## 2022-11-21 DIAGNOSIS — K3184 Gastroparesis: Secondary | ICD-10-CM | POA: Diagnosis not present

## 2022-11-21 MED ORDER — ONDANSETRON HCL 4 MG PO TABS: 4.0000 mg | ORAL_TABLET | Freq: Four times a day (QID) | ORAL | 5 refills | Status: DC | PRN

## 2022-11-21 NOTE — Patient Instructions (Addendum)
VISIT SUMMARY:  During your visit, we discussed your new onset of chest discomfort and coughing that started a month ago. You described the discomfort as a heaviness in the chest, which is not constant but seems to persist for most hours of the day. You also mentioned a sensation of 'something' in the chest. We also discussed your ongoing treatment for breast cancer, your history of diverticulitis, and your improved constipation.  YOUR PLAN:  -CHEST WALL PAIN (COSTOCHONDRITIS): This is a condition where the cartilage in your rib cage becomes inflamed, causing chest pain. We suspect this might be related to your Letrozole use for breast cancer treatment. I recommend a trial of Tylenol and ice application for symptomatic relief.  -BREAST CANCER: You are currently undergoing treatment with Letrozole. Your recent chest x-ray was clear, which is a good sign. Please continue your current treatment as directed by your oncologist.  -GASTROESOPHAGEAL REFLUX DISEASE (GERD): This is a condition where stomach acid frequently flows back into the tube connecting your mouth and stomach (esophagus). Your condition is controlled with Nexium, but you have occasional symptoms with certain foods. Please continue taking Nexium and try to avoid foods that trigger your symptoms.   INSTRUCTIONS:  Please continue your current treatments and modifications as discussed. Try a trial of Tylenol and heat or ice application for your chest discomfort. If your symptoms persist or worsen, please contact our office immediately.    We have sent the following prescriptions to your mail in pharmacy:  Ondansetron  If you have not heard from your mail in pharmacy within 1 week or if you have not received your medication in the mail, please contact us at [phone] so we may find out why.  I appreciate the opportunity to care for you. Stan Head, MD, [facility]

## 2022-11-21 NOTE — Progress Notes (Unsigned)
[name] 82 y.o. 03-Nov-1940 [id number]  Assessment & Plan:   Encounter Diagnoses  Name Primary?   Costochondritis Yes   Chronic abdominal pain    Functional dyspepsia    Gastroparesis    Gastroesophageal reflux disease, unspecified whether esophagitis present        New onset chest wall tenderness, possibly related to Letrozole use for breast cancer treatment. No concerning features for cardiac or gastrointestinal etiology. -Recommend trial of Tylenol and heat or ice application for symptomatic relief.   -Continue Nexium. Advise avoidance of trigger foods.  Continue dietary changes re; diverticular disease   Subjective:   Chief Complaint: chest pressure  HPI 82 yo [place] woman with a history of hyperplastic and fundic gland polyps, gastroparesis, diverticulitis, IBS, and a remote history of a benign carcinoid tumor of the stomach who presents with complaints of left lower quadrant pain that radiates down into the groin and in the back. She actually also has chronic abdominal pain issues.  Last colonoscopy 2012, severe diverticulosis throughout. Not repeated due to age and lack of polyps.   Discussed the use of AI scribe software for clinical note transcription with the patient, who gave verbal consent to proceed.  History of Present Illness   The patient, with a history of breast cancer, presents with a new onset of chest discomfort and coughing that started a month ago. The discomfort is described as a heaviness in the chest, which is not constant but seems to persist for most hours of the day. The patient denies any correlation with food intake or heartburn, although she is on daily Nexium for heartburn control. The coughing has improved, but the heaviness persists.  The patient also reports a sensation of 'something' in the chest, which she initially thought might be related to the stomach. However, she is able to eat and drink without difficulty. The  patient started on Letrozole for breast cancer treatment around the same time the symptoms began, leading her to suspect a possible connection.  The patient also mentions a history of diverticulitis, which is currently under control with dietary modifications. She reports an improvement in constipation. The patient underwent a lumpectomy for breast cancer in the summer, and the chest discomfort started a month ago. The patient denies any back pain but reports tenderness in the chest wall.  The patient has not tried any interventions such as Tylenol or a heating pad for the chest discomfort. She expresses concern about the new symptoms, given her recent cancer diagnosis. However, she is reassured by the fact that recent chest x-rays were clear.        CT 02/18/22 IMPRESSION: No acute findings.   Diffuse colonic diverticulosis, without radiographic evidence of diverticulitis.   Wt Readings from Last 3 Encounters:  11/21/22 165 lb (74.8 kg)  11/13/22 164 lb 0.6 oz (74.4 kg)  10/12/22 166 lb (75.3 kg)     Allergies  Allergen Reactions   Tramadol Other (See Comments)    Dizziness    Current Meds  Medication Sig   acetaminophen (TYLENOL) 325 MG tablet Take 2 tablets (650 mg total) by mouth every 6 (six) hours as needed for up to 30 doses for mild pain or moderate pain.   amLODipine (NORVASC) 5 MG tablet TAKE 1 TABLET BY MOUTH DAILY   aspirin EC 81 MG tablet Take 81 mg by mouth daily. Swallow whole.   betamethasone valerate ointment (VALISONE) 0.1 % Apply a small amount to affected area topically every other day.  Blood Glucose Monitoring Suppl (ONE TOUCH ULTRA 2) w/Device KIT USE TO CHECK BLOOD SUGAR AS DIRECTED   calcium-vitamin D (OSCAL WITH D) 500-5 MG-MCG tablet Take 1 tablet by mouth daily.   cholecalciferol (VITAMIN D) 1000 UNITS tablet Take 1,000 Units by mouth daily.   Esomeprazole Magnesium (NEXIUM 24HR) 20 MG TBEC Take 1 tablet by mouth daily.   furosemide (LASIX) 20 MG  tablet TAKE 1 TABLET BY MOUTH DAILY AS  NEEDED   Lancets (ONETOUCH ULTRASOFT) lancets USE AS DIRECTED 3 TIMES  DAILY   letrozole (FEMARA) 2.5 MG tablet Take 1 tablet (2.5 mg total) by mouth daily.   losartan (COZAAR) 50 MG tablet TAKE 1 TABLET BY MOUTH TWICE  DAILY   metFORMIN (GLUCOPHAGE-XR) 500 MG 24 hr tablet TAKE 1 TABLET BY MOUTH IN THE  MORNING AND 1 TABLET BY MOUTH AT BEDTIME   metoCLOPramide (REGLAN) 5 MG tablet TAKE 1 TABLET BY MOUTH TWICE  DAILY AS NEEDED (Patient taking differently: Take 5 mg by mouth as needed.)   metoprolol tartrate (LOPRESSOR) 50 MG tablet Take 1 tablet (50 mg total) by mouth 2 (two) times daily.   Multiple Vitamin (MULTIVITAMIN ADULT PO) Take by mouth.   ONETOUCH ULTRA test strip CHECK BLOOD SUGAR 3 TIMES DAILY  OR AS NEEDED   potassium chloride (KLOR-CON M) 10 MEQ tablet TAKE 1 TABLET BY MOUTH DAILY   rosuvastatin (CRESTOR) 20 MG tablet TAKE 1 TABLET BY MOUTH DAILY   thiamine (VITAMIN B-1) 100 MG tablet Take 100 mg by mouth every other day.   vitamin E 180 MG (400 UNITS) capsule Take 400 Units by mouth daily.   Past Medical History:  Diagnosis Date   Abdominal pain in female 03/18/2010   Qualifier: Diagnosis of  By: Leone Payor MD, Alfonse Ras E    Anemia 06/08/2014   Anxiety    Arthritis    Spinal Osteoarthritis   Breast cancer (HCC)    Carcinoid tumor of stomach    Cataract    Chest pain    Myoview 12/15 no ischemia.   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 09/05/2006   Qualifier: Diagnosis of  By: Charlsie Quest RMA, Lucy     Diabetic peripheral neuropathy (HCC) 10/29/2013   Encounter for preventative adult health care exam with abnormal findings 09/14/2013   Esophageal reflux    Gastric polyp    Fundic Gland   Gastroparesis    Headache(784.0)    Heart murmur    Echocardiogram 2/11: EF 60-65%, mild LAE, grade 1 diastolic dysfunction, aortic valve sclerosis, mean  gradient 9 mm of mercury, PASP 34   Hematuria 03/16/2016   Iron deficiency anemia, unspecified    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   Leg swelling    bilateral   Neck pain 04/22/2015   PONV (postoperative nausea and vomiting)    pt states body temperature drops every time she has anesthesia; pt states only needs small amount of anesthesia   PSVT (paroxysmal supraventricular tachycardia) (HCC)    Pure hypercholesterolemia    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   Renal insufficiency 06/18/2019   pt states  L kidney function very low-functions at about 20%   Stroke [facility])    tia, 2014   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   Type II or unspecified type diabetes mellitus without mention of complication, not stated as uncontrolled    Unspecified essential hypertension    Unspecified hereditary and idiopathic peripheral  neuropathy 10/29/2013   Vitamin D deficiency    Past Surgical History:  Procedure Laterality Date   BREAST BIOPSY Right 06/13/2022   [place] RT BREAST BX W LOC DEV 1ST LESION IMG BX SPEC US GUIDE 06/13/2022 [facility]   BREAST LUMPECTOMY Right 07/31/2022   Procedure: RIGHT BREAST LUMPECTOMY;  Surgeon: Abigail Miyamoto, MD;  Location: [facility];  Service: General;  Laterality: Right;   CHOLECYSTECTOMY  1993   COLONOSCOPY  11/11/2010   diverticulosis   DILATATION & CURRETTAGE/HYSTEROSCOPY WITH RESECTOCOPE N/A 02/25/2013   Procedure: Attempted hysteroscopy with uterine perforation;  Surgeon: Jacqualin Combes de Gwenevere Ghazi, MD;  Location: [facility];  Service: Gynecology;  Laterality: N/A;   ESOPHAGOGASTRODUODENOSCOPY  08/29/2010; 09/15/2010   Carcinoid tumor less than 1 cm in July 2012 not seen in August 2012 , gastritis, fundic gland polyps   ESOPHAGOGASTRODUODENOSCOPY  05/16/2011   ESOPHAGOGASTRODUODENOSCOPY  06/14/2012   EUS  12/15/2010   Procedure: UPPER ENDOSCOPIC ULTRASOUND (EUS) LINEAR;  Surgeon: Rob Bunting, MD;  Location: [facility];  Service: Endoscopy;  Laterality: N/A;   EYE  SURGERY Bilateral    Bi lateral cateracts and bi lateral laser   LAPAROSCOPY N/A 02/25/2013   Procedure: Cystoscopy and laparoscopy with fulguration of uterine serosa;  Surgeon: Jacqualin Combes de Gwenevere Ghazi, MD;  Location: [facility];  Service: Gynecology;  Laterality: N/A;   TONSILLECTOMY     Social History   Social History Narrative   Patient was married Programme researcher, broadcasting/film/video) - widow   Patient does not have any children.   Patient is right-handed.   Patient has a BA degree.   One caffeine drink daily    family history includes Diabetes in her brother, maternal grandmother, mother, sister, and sister; Heart disease in her brother, brother, sister, sister, and sister; Hyperlipidemia in her brother, sister, and sister; Hypertension in her brother, paternal grandmother, sister, and sister; Intellectual disability in her brother; Stroke in her father.   Review of Systems As per hpi  Objective:   Physical Exam BP 120/60 (BP Location: Left Arm, Patient Position: Sitting, Cuff Size: Normal)   Pulse 68   Ht 4' 11.5" (1.511 m) Comment: height measured without shoes  Wt 165 lb (74.8 kg)   LMP 02/07/1992   BMI 32.77 kg/m  /

## 2022-12-06 ENCOUNTER — Emergency Department (HOSPITAL_BASED_OUTPATIENT_CLINIC_OR_DEPARTMENT_OTHER): Payer: Medicare Other

## 2022-12-06 ENCOUNTER — Emergency Department (HOSPITAL_BASED_OUTPATIENT_CLINIC_OR_DEPARTMENT_OTHER)
Admission: EM | Admit: 2022-12-06 | Discharge: 2022-12-07 | Disposition: A | Discharge status: Home / Self Care | Payer: Medicare Other | Attending: Emergency Medicine | Admitting: Emergency Medicine

## 2022-12-06 ENCOUNTER — Encounter (HOSPITAL_BASED_OUTPATIENT_CLINIC_OR_DEPARTMENT_OTHER): Payer: Self-pay

## 2022-12-06 DIAGNOSIS — M7989 Other specified soft tissue disorders: Secondary | ICD-10-CM | POA: Insufficient documentation

## 2022-12-06 DIAGNOSIS — R071 Chest pain on breathing: Secondary | ICD-10-CM | POA: Diagnosis not present

## 2022-12-06 DIAGNOSIS — I129 Hypertensive chronic kidney disease with stage 1 through stage 4 chronic kidney disease, or unspecified chronic kidney disease: Secondary | ICD-10-CM | POA: Diagnosis not present

## 2022-12-06 DIAGNOSIS — Z79899 Other long term (current) drug therapy: Secondary | ICD-10-CM | POA: Insufficient documentation

## 2022-12-06 DIAGNOSIS — E871 Hypo-osmolality and hyponatremia: Secondary | ICD-10-CM | POA: Diagnosis not present

## 2022-12-06 DIAGNOSIS — Z1152 Encounter for screening for COVID-19: Secondary | ICD-10-CM | POA: Insufficient documentation

## 2022-12-06 DIAGNOSIS — R6889 Other general symptoms and signs: Secondary | ICD-10-CM | POA: Diagnosis not present

## 2022-12-06 DIAGNOSIS — N189 Chronic kidney disease, unspecified: Secondary | ICD-10-CM | POA: Insufficient documentation

## 2022-12-06 DIAGNOSIS — E1140 Type 2 diabetes mellitus with diabetic neuropathy, unspecified: Secondary | ICD-10-CM | POA: Insufficient documentation

## 2022-12-06 DIAGNOSIS — Z853 Personal history of malignant neoplasm of breast: Secondary | ICD-10-CM | POA: Insufficient documentation

## 2022-12-06 DIAGNOSIS — E1122 Type 2 diabetes mellitus with diabetic chronic kidney disease: Secondary | ICD-10-CM | POA: Diagnosis not present

## 2022-12-06 DIAGNOSIS — R059 Cough, unspecified: Secondary | ICD-10-CM | POA: Insufficient documentation

## 2022-12-06 DIAGNOSIS — I700 Atherosclerosis of aorta: Secondary | ICD-10-CM | POA: Diagnosis not present

## 2022-12-06 DIAGNOSIS — R0789 Other chest pain: Secondary | ICD-10-CM | POA: Insufficient documentation

## 2022-12-06 DIAGNOSIS — Z7984 Long term (current) use of oral hypoglycemic drugs: Secondary | ICD-10-CM | POA: Insufficient documentation

## 2022-12-06 DIAGNOSIS — Z8673 Personal history of transient ischemic attack (TIA), and cerebral infarction without residual deficits: Secondary | ICD-10-CM | POA: Insufficient documentation

## 2022-12-06 DIAGNOSIS — Z7982 Long term (current) use of aspirin: Secondary | ICD-10-CM | POA: Diagnosis not present

## 2022-12-06 DIAGNOSIS — R079 Chest pain, unspecified: Secondary | ICD-10-CM

## 2022-12-06 DIAGNOSIS — Z743 Need for continuous supervision: Secondary | ICD-10-CM | POA: Diagnosis not present

## 2022-12-06 LAB — COMPREHENSIVE METABOLIC PANEL
ALT: 28 U/L (ref 0–44)
AST: 29 U/L (ref 15–41)
Albumin: 4.3 g/dL (ref 3.5–5.0)
Alkaline Phosphatase: 52 U/L (ref 38–126)
Anion gap: 12 (ref 5–15)
BUN: 23 mg/dL (ref 8–23)
CO2: 24 mmol/L (ref 22–32)
Calcium: 9.4 mg/dL (ref 8.9–10.3)
Chloride: 91 mmol/L — ABNORMAL LOW (ref 98–111)
Creatinine, Ser: 0.91 mg/dL (ref 0.44–1.00)
GFR, Estimated: >60 mL/min (ref >60)
Glucose, Bld: 144 mg/dL — ABNORMAL HIGH (ref 70–99)
Potassium: 4.4 mmol/L (ref 3.5–5.1)
Sodium: 127 mmol/L — ABNORMAL LOW (ref 135–145)
Total Bilirubin: 0.8 mg/dL (ref 0.3–1.2)
Total Protein: 7.2 g/dL (ref 6.5–8.1)

## 2022-12-06 LAB — D-DIMER, QUANTITATIVE: D-Dimer, Quant: <0.27 ug{FEU}/mL (ref 0.00–0.50)

## 2022-12-06 LAB — CBC WITH DIFFERENTIAL/PLATELET
Abs Immature Granulocytes: 0.03 10*3/uL (ref 0.00–0.07)
Basophils Absolute: 0 10*3/uL (ref 0.0–0.1)
Basophils Relative: 1 %
Eosinophils Absolute: 0.1 10*3/uL (ref 0.0–0.5)
Eosinophils Relative: 1 %
HCT: 36.2 % (ref 36.0–46.0)
Hemoglobin: 12.4 g/dL (ref 12.0–15.0)
Immature Granulocytes: 0 %
Lymphocytes Relative: 22 %
Lymphs Abs: 1.8 10*3/uL (ref 0.7–4.0)
MCH: 28.4 pg (ref 26.0–34.0)
MCHC: 34.3 g/dL (ref 30.0–36.0)
MCV: 83 fL (ref 80.0–100.0)
Monocytes Absolute: 0.6 10*3/uL (ref 0.1–1.0)
Monocytes Relative: 8 %
Neutro Abs: 5.5 10*3/uL (ref 1.7–7.7)
Neutrophils Relative %: 68 %
Platelets: 217 10*3/uL (ref 150–400)
RBC: 4.36 MIL/uL (ref 3.87–5.11)
RDW: 12.1 % (ref 11.5–15.5)
WBC: 8 10*3/uL (ref 4.0–10.5)
nRBC: 0 % (ref 0.0–0.2)

## 2022-12-06 LAB — URINALYSIS, W/ REFLEX TO CULTURE (INFECTION SUSPECTED)
Bilirubin Urine: NEGATIVE
Glucose, UA: NEGATIVE mg/dL
Ketones, ur: NEGATIVE mg/dL
Leukocytes,Ua: NEGATIVE
Nitrite: NEGATIVE
Protein, ur: NEGATIVE mg/dL
Specific Gravity, Urine: 1.01 (ref 1.005–1.030)
WBC, UA: NONE SEEN WBC/hpf (ref 0–5)
pH: 6 (ref 5.0–8.0)

## 2022-12-06 LAB — RESP PANEL BY RT-PCR (RSV, FLU A&B, COVID)  RVPGX2
Influenza A by PCR: NEGATIVE
Influenza B by PCR: NEGATIVE
Resp Syncytial Virus by PCR: NEGATIVE
SARS Coronavirus 2 by RT PCR: NEGATIVE

## 2022-12-06 LAB — TROPONIN I (HIGH SENSITIVITY)
Troponin I (High Sensitivity): 5 ng/L (ref <18)
Troponin I (High Sensitivity): 7 ng/L (ref <18)

## 2022-12-06 LAB — BRAIN NATRIURETIC PEPTIDE: B Natriuretic Peptide: 53.4 pg/mL (ref 0.0–100.0)

## 2022-12-06 MED ORDER — AMLODIPINE BESYLATE 5 MG PO TABS
5.0000 mg | ORAL_TABLET | Freq: Once | ORAL | Status: DC
  Filled 2022-12-06: qty 1

## 2022-12-06 NOTE — ED Provider Notes (Signed)
[place] [facility] AT [facility] Provider Note   CSN: [id number] Arrival date & time: 12/06/22  1849     History {Add pertinent medical, surgical, social history, OB history to HPI:1} Chief Complaint  Patient presents with   Chest Pain    [name] is a 82 y.o. female.  HPI     82 year old female with a history of type 2 diabetes, gastroparesis, remote history of benign carcinoid tumor of the stomach, breast cancer, hypertension, hyperlipidemia, paroxysmal supraventricular tachycardia, who presents with concern for chest pain and cough  Feeling chest tightness for a while, but today was the worst.  A few weeks ago saw Dr. Myna Hidalgo for breast cancer, and he did chest xr then because having chest tightness. Saw Dr. Leone Payor thinking from stomach.   Chest tightness in the morning, sometimes cough a little cough in the AM, not too much just a little cough. Little cough is not just in AM, not getting worse, is mild.  Thought it was from stomach but ti said now. Not worse with walking. Feels pain is worse with deep breaths.  This AM woke up not feeling good at all, tired, always lightheaded when paramedics bp was 190. Lightheadedness not worse today. No syncope.  Temperature 99 today.  Thought covid test was red but one line and not two.  No vomiting. But has nausea.  Take medicine for stomach.  No urinary symptoms In AM has runny nose, dry throat but not sore  Both legs swelling  No long trips, recent surgeries in June had breast surgery.       Family hx of heart disease, brother, sister, father mi/cva   Past Medical History:  Diagnosis Date   Abdominal pain in female 03/18/2010   Qualifier: Diagnosis of  By: Leone Payor MD, Charlyne Quale    Anemia 06/08/2014   Anxiety    Arthritis    Spinal Osteoarthritis   Breast cancer (HCC)    Carcinoid tumor of stomach    Cataract    Chest pain    Myoview 12/15 no ischemia.   Chronic kidney disease     Left kidney smaller than right kidney   Constipation 11/21/2016   Diabetes mellitus type 2 in obese 09/05/2006   Qualifier: Diagnosis of  By: Charlsie Quest RMA, Lucy     Diabetic peripheral neuropathy (HCC) 10/29/2013   Encounter for preventative adult health care exam with abnormal findings 09/14/2013   Esophageal reflux    Gastric polyp    Fundic Gland   Gastroparesis    Headache(784.0)    Heart murmur    Echocardiogram 2/11: EF 60-65%, mild LAE, grade 1 diastolic dysfunction, aortic valve sclerosis, mean gradient 9 mm of mercury, PASP 34   Hematuria 03/16/2016   Iron deficiency anemia, unspecified    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   Leg swelling    bilateral   Neck pain 04/22/2015   PONV (postoperative nausea and vomiting)    pt states body temperature drops every time she has anesthesia; pt states only needs small amount of anesthesia   PSVT (paroxysmal supraventricular tachycardia) (HCC)    Pure hypercholesterolemia    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 Authorizing Provider  acetaminophen (TYLENOL) 325 MG tablet Take 2 tablets (650 mg total) by mouth every 6 (six) hours as needed for up to 30 doses for mild pain or moderate pain. 08/03/20   Terald Sleeper, MD  amLODipine (NORVASC) 5 MG tablet TAKE 1 TABLET BY MOUTH DAILY 07/25/22   Rollene Rotunda, MD  aspirin EC 81 MG tablet Take 81 mg by mouth daily. Swallow whole.    [provider]  betamethasone valerate ointment (VALISONE) 0.1 % Apply a small  amount to affected area topically every other day. 10/13/21   [name], MD  Blood Glucose Monitoring Suppl (ONE TOUCH ULTRA 2) w/Device KIT USE TO CHECK BLOOD SUGAR AS DIRECTED 01/13/21   [name], MD  calcium-vitamin D (OSCAL WITH D) 500-5 MG-MCG tablet Take 1 tablet by mouth daily.    [provider]  cholecalciferol (VITAMIN D) 1000 UNITS tablet Take 1,000 Units by mouth daily.    [provider]  Esomeprazole Magnesium (NEXIUM 24HR) 20 MG TBEC Take 1 tablet by mouth daily.    [provider]  furosemide (LASIX) 20 MG tablet TAKE 1 TABLET BY MOUTH DAILY AS  NEEDED 10/05/22   [name], MD  Lancets [facility] ULTRASOFT) lancets USE AS DIRECTED 3 TIMES  DAILY 08/10/20   [name], MD  letrozole [facility]) 2.5 MG tablet Take 1 tablet (2.5 mg total) by mouth daily. 09/08/22   Josph Macho, MD  losartan (COZAAR) 50 MG tablet TAKE 1 TABLET BY MOUTH TWICE  DAILY 10/05/22   [name], MD  metFORMIN (GLUCOPHAGE-XR) 500 MG 24 hr tablet TAKE 1 TABLET BY MOUTH IN THE  MORNING AND 1 TABLET BY MOUTH AT BEDTIME 10/05/22   [name], MD  metoCLOPramide (REGLAN) 5 MG tablet TAKE 1 TABLET BY MOUTH TWICE  DAILY AS NEEDED Patient taking differently: Take 5 mg by mouth as needed. 07/26/22   Iva Boop, MD  metoprolol tartrate (LOPRESSOR) 50 MG tablet Take 1 tablet (50 mg total) by mouth 2 (two) times daily. 05/23/22   Rollene Rotunda, MD  Multiple Vitamin (MULTIVITAMIN ADULT PO) Take by mouth.    [provider]  ondansetron (ZOFRAN) 4 MG tablet Take 1 tablet (4 mg total) by mouth every 6 (six) hours as needed. 11/21/22   Iva Boop, MD  ONETOUCH ULTRA test strip CHECK BLOOD SUGAR 3 TIMES DAILY  OR AS NEEDED 10/05/22   [name], MD  potassium chloride (KLOR-CON M) 10 MEQ tablet TAKE 1 TABLET BY MOUTH DAILY 10/05/22   [name], MD  rosuvastatin (CRESTOR) 20 MG tablet TAKE 1 TABLET BY MOUTH DAILY 07/04/22   [name], MD   thiamine (VITAMIN B-1) 100 MG tablet Take 100 mg by mouth every other day.    [provider]  vitamin E 180 MG (400 UNITS) capsule Take 400 Units by mouth daily.    [provider]      Allergies    Tramadol    Review of Systems   Review of Systems  Physical Exam Updated Vital Signs Ht 5\' 1"  (1.549 m)   Wt 76 kg   LMP 02/07/1992   SpO2 99%   BMI 31.66 kg/m  Physical Exam  ED Results / Procedures / Treatments  Labs (all labs ordered are listed, but only abnormal results are displayed) Labs Reviewed - No data to display  EKG None  Radiology No results found.  Procedures Procedures  {Document cardiac monitor, telemetry assessment procedure when appropriate:1}  Medications Ordered in ED Medications - No data to display  ED Course/ Medical Decision Making/ A&P   {   Click here for ABCD2, HEART and other calculatorsREFRESH Note before signing :1}                              Medical Decision Making Amount and/or Complexity of Data Reviewed Labs: ordered.   ***  {Document critical care time when appropriate:1} {Document review of labs and clinical decision tools ie heart score, Chads2Vasc2 etc:1}  {Document your independent review of radiology images, and any outside records:1} {Document your discussion with family members, caretakers, and with consultants:1} {Document social determinants of health affecting pt's care:1} {Document your decision making why or why not admission, treatments were needed:1} Final Clinical Impression(s) / ED Diagnoses Final diagnoses:  None    Rx / DC Orders ED Discharge Orders     None

## 2022-12-06 NOTE — ED Triage Notes (Addendum)
Pt BIB EMS from home with intermittent chest tightness x 2 weeks, with cough. States thinks she has covid but has had 2 negative covid tests.  Given 324 ASA by fire department pta.  Receiving treatment for breast cancer

## 2022-12-07 ENCOUNTER — Encounter: Payer: Self-pay | Admitting: Physician Assistant

## 2022-12-07 ENCOUNTER — Other Ambulatory Visit (HOSPITAL_BASED_OUTPATIENT_CLINIC_OR_DEPARTMENT_OTHER): Payer: Self-pay

## 2022-12-07 ENCOUNTER — Telehealth: Payer: Self-pay

## 2022-12-07 ENCOUNTER — Ambulatory Visit: Payer: Medicare Other | Admitting: Physician Assistant

## 2022-12-07 VITALS — BP 131/76 | HR 61 | Temp 96.5°F | Ht 61.0 in | Wt 164.2 lb

## 2022-12-07 DIAGNOSIS — J020 Streptococcal pharyngitis: Secondary | ICD-10-CM

## 2022-12-07 DIAGNOSIS — J029 Acute pharyngitis, unspecified: Secondary | ICD-10-CM | POA: Diagnosis not present

## 2022-12-07 DIAGNOSIS — R079 Chest pain, unspecified: Secondary | ICD-10-CM

## 2022-12-07 LAB — POCT RAPID STREP A (OFFICE): Rapid Strep A Screen: POSITIVE — AB

## 2022-12-07 MED ORDER — AMOXICILLIN 500 MG PO CAPS
500.0000 mg | ORAL_CAPSULE | Freq: Three times a day (TID) | ORAL | 0 refills | Status: AC
Start: 2022-12-07 — End: 2022-12-17
  Filled 2022-12-07: qty 30, 10d supply, fill #0

## 2022-12-07 NOTE — Patient Instructions (Signed)
Recommend famotidine 20 mg BID over the counter

## 2022-12-07 NOTE — Telephone Encounter (Signed)
Initial Comment Caller states she has tested positive for covid. Has fever, last temp 99. Heaviness in her chest. Translation No Nurse Assessment Nurse: Loura Pardon, RN, Cala Bradford Date/Time (Eastern Time): 12/06/2022 5:45:23 PM Confirm and document reason for call. If symptomatic, describe symptoms. ---Caller states she tested positive for Covid an hour ago. Highest temp 99.0. States her chest feels tight. Does the patient have any new or worsening symptoms? ---Yes Will a triage be completed? ---Yes Related visit to physician within the last 2 weeks? ---No Does the PT have any chronic conditions? (i.e. diabetes, asthma, this includes High risk factors for pregnancy, etc.) ---Yes List chronic conditions. ---Hx breast CA, HTN, diabetes, hyperlipidemia Is this a behavioral health or substance abuse call? ---No Guidelines Guideline Title Affirmed Question Affirmed Notes Nurse Date/Time (Eastern Time) COVID-19 - Diagnosed or Suspected SEVERE or constant chest pain or pressure (Exception: Mild central chest pain, present only when coughing.) Cabe, RN, Cala Bradford 12/06/2022 5:47:18 PM Disp. Time Lamount Cohen Time) Disposition Final User 12/06/2022 5:43:58 PM Send to Urgent Randel Books, Trula Ore PLEASE NOTE: All timestamps contained within this report are represented as [place] Standard Time. CONFIDENTIALTY NOTICE: This fax transmission is intended only for the addressee. It contains information that is legally privileged, confidential or otherwise protected from use or disclosure. If you are not the intended recipient, you are strictly prohibited from reviewing, disclosing, copying using or disseminating any of this information or taking any action in reliance on or regarding this information. If you have received this fax in error, please notify us immediately by telephone so that we can arrange for its return to [place]. Phone: [phone], Toll-Free: [phone], Fax: [phone] Page: 2 of  2 Call Id: [id number] Disp. Time Lamount Cohen Time) Disposition Final User 12/06/2022 5:49:58 PM Go to ED Now Yes Loura Pardon, RN, Cala Bradford Final Disposition 12/06/2022 5:49:58 PM Go to ED Now Yes Cabe, RN, Anders Simmonds Disagree/Comply Comply Caller Understands Yes PreDisposition Did not know what to do Care Advice Given Per Guideline GO TO ED NOW: * You need to be seen in the Emergency Department. * Go to the ED at ___________ Hospital. * Leave now. Drive carefully. TELL HEALTHCARE PERSONNEL THAT YOU MIGHT HAVE COVID-19: * Tell the first healthcare worker you meet that you may have COVID-19. WEAR A MASK - COVER YOUR MOUTH AND NOSE: * Wear a mask that fits snuggly over your mouth and nose. ANOTHER ADULT SHOULD DRIVE: * It is better and safer if another adult drives instead of you. CALL 911 IF: * You become worse CARE ADVICE given per COVID-19 - DIAGNOSED OR SUSPECTED (Adult) guideline. Referrals [facility] - ED

## 2022-12-07 NOTE — Telephone Encounter (Signed)
Pt seen in ED 

## 2022-12-07 NOTE — Progress Notes (Signed)
Established patient visit   Patient: [name]   DOB: 04/27/1940   82 y.o. Female  MRN: [id number] Visit Date: 12/07/2022  Today's healthcare provider: Alfredia Ferguson, PA-C   Cc. Chest pain, sore throat  Subjective     Pt was seen in the ED 10/30 for cough and chest pain.  Sodium 127 EKG was normal, chest xray normal. Pt recommended to see cardiology and follow up with pcp.  Initially thought she tested positive for COVID, swab was repeated in ED and was negative.  She reports persistent chest heaviness, and a sore throat, swollen glands. Thought she saw a white spot in her throat.  Medications: Outpatient Medications Prior to Visit  Medication Sig   acetaminophen (TYLENOL) 325 MG tablet Take 2 tablets (650 mg total) by mouth every 6 (six) hours as needed for up to 30 doses for mild pain or moderate pain.   amLODipine (NORVASC) 5 MG tablet TAKE 1 TABLET BY MOUTH DAILY   aspirin EC 81 MG tablet Take 81 mg by mouth daily. Swallow whole.   betamethasone valerate ointment (VALISONE) 0.1 % Apply a small amount to affected area topically every other day.   Blood Glucose Monitoring Suppl (ONE TOUCH ULTRA 2) w/Device KIT USE TO CHECK BLOOD SUGAR AS DIRECTED   calcium-vitamin D (OSCAL WITH D) 500-5 MG-MCG tablet Take 1 tablet by mouth daily.   cholecalciferol (VITAMIN D) 1000 UNITS tablet Take 1,000 Units by mouth daily.   Esomeprazole Magnesium (NEXIUM 24HR) 20 MG TBEC Take 1 tablet by mouth daily.   furosemide (LASIX) 20 MG tablet TAKE 1 TABLET BY MOUTH DAILY AS  NEEDED   Lancets (ONETOUCH ULTRASOFT) lancets USE AS DIRECTED 3 TIMES  DAILY   letrozole (FEMARA) 2.5 MG tablet Take 1 tablet (2.5 mg total) by mouth daily.   losartan (COZAAR) 50 MG tablet TAKE 1 TABLET BY MOUTH TWICE  DAILY   metFORMIN (GLUCOPHAGE-XR) 500 MG 24 hr tablet TAKE 1 TABLET BY MOUTH IN THE  MORNING AND 1 TABLET BY MOUTH AT BEDTIME   metoCLOPramide (REGLAN) 5 MG tablet TAKE 1 TABLET BY MOUTH TWICE   DAILY AS NEEDED (Patient taking differently: Take 5 mg by mouth as needed.)   metoprolol tartrate (LOPRESSOR) 50 MG tablet Take 1 tablet (50 mg total) by mouth 2 (two) times daily.   Multiple Vitamin (MULTIVITAMIN ADULT PO) Take by mouth.   ondansetron (ZOFRAN) 4 MG tablet Take 1 tablet (4 mg total) by mouth every 6 (six) hours as needed.   ONETOUCH ULTRA test strip CHECK BLOOD SUGAR 3 TIMES DAILY  OR AS NEEDED   potassium chloride (KLOR-CON M) 10 MEQ tablet TAKE 1 TABLET BY MOUTH DAILY   rosuvastatin (CRESTOR) 20 MG tablet TAKE 1 TABLET BY MOUTH DAILY   thiamine (VITAMIN B-1) 100 MG tablet Take 100 mg by mouth every other day.   vitamin E 180 MG (400 UNITS) capsule Take 400 Units by mouth daily.   No facility-administered medications prior to visit.    Review of Systems  Constitutional:  Negative for fatigue and fever.  HENT:  Positive for sore throat.   Respiratory:  Negative for cough and shortness of breath.   Cardiovascular:  Positive for chest pain. Negative for leg swelling.  Gastrointestinal:  Negative for abdominal pain.  Neurological:  Negative for dizziness and headaches.       Objective    BP 131/76   Pulse 61   Temp (!) 96.5 F (35.8 C) (Oral)  Ht 5\' 1"  (1.549 m)   Wt 164 lb 4 oz (74.5 kg)   LMP 02/07/1992   SpO2 100%   BMI 31.03 kg/m    Physical Exam Constitutional:      General: She is awake.     Appearance: She is well-developed.  HENT:     Head: Normocephalic.     Mouth/Throat:     Pharynx: Posterior oropharyngeal erythema present. No oropharyngeal exudate.  Eyes:     Conjunctiva/sclera: Conjunctivae normal.  Cardiovascular:     Rate and Rhythm: Normal rate and regular rhythm.     Heart sounds: Normal heart sounds.  Pulmonary:     Effort: Pulmonary effort is normal.     Breath sounds: Normal breath sounds.  Skin:    General: Skin is warm.  Neurological:     Mental Status: She is alert and oriented to person, place, and time.  Psychiatric:         Attention and Perception: Attention normal.        Mood and Affect: Mood normal.        Speech: Speech normal.        Behavior: Behavior is cooperative.      Results for orders placed or performed in visit on 12/07/22  POCT rapid strep A  Result Value Ref Range   Rapid Strep A Screen Positive (A) Negative  Results for orders placed or performed during the hospital encounter of 12/06/22  Resp panel by RT-PCR (RSV, Flu A&B, Covid) Anterior Nasal Swab   Specimen: Anterior Nasal Swab  Result Value Ref Range   SARS Coronavirus 2 by RT PCR NEGATIVE NEGATIVE   Influenza A by PCR NEGATIVE NEGATIVE   Influenza B by PCR NEGATIVE NEGATIVE   Resp Syncytial Virus by PCR NEGATIVE NEGATIVE  CBC with Differential  Result Value Ref Range   WBC 8.0 4.0 - 10.5 K/uL   RBC 4.36 3.87 - 5.11 MIL/uL   Hemoglobin 12.4 12.0 - 15.0 g/dL   HCT 53.6 64.4 - 03.4 %   MCV 83.0 80.0 - 100.0 fL   MCH 28.4 26.0 - 34.0 pg   MCHC 34.3 30.0 - 36.0 g/dL   RDW 74.2 59.5 - 63.8 %   Platelets 217 150 - 400 K/uL   nRBC 0.0 0.0 - 0.2 %   Neutrophils Relative % 68 %   Neutro Abs 5.5 1.7 - 7.7 K/uL   Lymphocytes Relative 22 %   Lymphs Abs 1.8 0.7 - 4.0 K/uL   Monocytes Relative 8 %   Monocytes Absolute 0.6 0.1 - 1.0 K/uL   Eosinophils Relative 1 %   Eosinophils Absolute 0.1 0.0 - 0.5 K/uL   Basophils Relative 1 %   Basophils Absolute 0.0 0.0 - 0.1 K/uL   Immature Granulocytes 0 %   Abs Immature Granulocytes 0.03 0.00 - 0.07 K/uL  Comprehensive metabolic panel  Result Value Ref Range   Sodium 127 (L) 135 - 145 mmol/L   Potassium 4.4 3.5 - 5.1 mmol/L   Chloride 91 (L) 98 - 111 mmol/L   CO2 24 22 - 32 mmol/L   Glucose, Bld 144 (H) 70 - 99 mg/dL   BUN 23 8 - 23 mg/dL   Creatinine, Ser 7.56 0.44 - 1.00 mg/dL   Calcium 9.4 8.9 - 43.3 mg/dL   Total Protein 7.2 6.5 - 8.1 g/dL   Albumin 4.3 3.5 - 5.0 g/dL   AST 29 15 - 41 U/L   ALT 28 0 - 44 U/L  Alkaline Phosphatase 52 38 - 126 U/L   Total Bilirubin 0.8  0.3 - 1.2 mg/dL   GFR, Estimated >40 >98 mL/min   Anion gap 12 5 - 15  D-dimer, quantitative  Result Value Ref Range   D-Dimer, Quant <0.27 0.00 - 0.50 ug/mL-FEU  Brain natriuretic peptide  Result Value Ref Range   B Natriuretic Peptide 53.4 0.0 - 100.0 pg/mL  Urinalysis, w/ Reflex to Culture (Infection Suspected) -Urine, Clean Catch  Result Value Ref Range   Specimen Source URINE, CLEAN CATCH    Color, Urine YELLOW YELLOW   APPearance CLEAR CLEAR   Specific Gravity, Urine 1.010 1.005 - 1.030   pH 6.0 5.0 - 8.0   Glucose, UA NEGATIVE NEGATIVE mg/dL   Hgb urine dipstick TRACE (A) NEGATIVE   Bilirubin Urine NEGATIVE NEGATIVE   Ketones, ur NEGATIVE NEGATIVE mg/dL   Protein, ur NEGATIVE NEGATIVE mg/dL   Nitrite NEGATIVE NEGATIVE   Leukocytes,Ua NEGATIVE NEGATIVE   Squamous Epithelial / HPF 0-5 0 - 5 /HPF   WBC, UA NONE SEEN 0 - 5 WBC/hpf   RBC / HPF 0-5 0 - 5 RBC/hpf   Bacteria, UA RARE (A) NONE SEEN  Troponin I (High Sensitivity)  Result Value Ref Range   Troponin I (High Sensitivity) 5 <18 ng/L  Troponin I (High Sensitivity)  Result Value Ref Range   Troponin I (High Sensitivity) 7 <18 ng/L    Assessment & Plan    Strep pharyngitis Hydrate, tylenol. Rx amoxicillin bid x 10 days -     Amoxicillin; Take 1 capsule (500 mg total) by mouth 3 (three) times daily for 10 days.  Dispense: 30 capsule; Refill: 0  Chest pain, unspecified type Reviewed w/u at ED Suggested pepcid 20 mg bid in addition to her nexium  Pt has f/u with cardio scheduled.  Sore throat -     POCT rapid strep A   Return if symptoms worsen or fail to improve.       Alfredia Ferguson, PA-C  [facility] at [facility] [phone] (phone) [phone] (fax)  [facility]

## 2022-12-08 ENCOUNTER — Other Ambulatory Visit: Payer: Self-pay | Admitting: Internal Medicine

## 2022-12-12 ENCOUNTER — Telehealth: Payer: Self-pay | Admitting: Family Medicine

## 2022-12-12 NOTE — Telephone Encounter (Signed)
Pt called and requested to speak with nurse regarding the medication amoxicillin (AMOXIL) 500 MG capsule. She stated that she doesn't feel as if it is working and her symptoms are not improving. Please call and advise patient.

## 2022-12-13 ENCOUNTER — Ambulatory Visit: Payer: Medicare Other | Admitting: Physician Assistant

## 2022-12-13 ENCOUNTER — Encounter: Payer: Self-pay | Admitting: Physician Assistant

## 2022-12-13 ENCOUNTER — Other Ambulatory Visit (HOSPITAL_BASED_OUTPATIENT_CLINIC_OR_DEPARTMENT_OTHER): Payer: Self-pay

## 2022-12-13 VITALS — BP 144/82 | HR 63 | Temp 97.6°F | Ht 61.0 in | Wt 164.2 lb

## 2022-12-13 DIAGNOSIS — B379 Candidiasis, unspecified: Secondary | ICD-10-CM | POA: Diagnosis not present

## 2022-12-13 DIAGNOSIS — T3695XA Adverse effect of unspecified systemic antibiotic, initial encounter: Secondary | ICD-10-CM

## 2022-12-13 DIAGNOSIS — J020 Streptococcal pharyngitis: Secondary | ICD-10-CM

## 2022-12-13 MED ORDER — FLUCONAZOLE 150 MG PO TABS
150.0000 mg | ORAL_TABLET | Freq: Once | ORAL | 0 refills | Status: AC
Start: 2022-12-13 — End: 2022-12-14
  Filled 2022-12-13: qty 1, 1d supply, fill #0

## 2022-12-13 NOTE — Telephone Encounter (Signed)
Routed to Team Dr[facility]el per pt request since pt saw Lillia Abed regarding this issue.

## 2022-12-13 NOTE — Telephone Encounter (Signed)
Called patient, it is sore throat  Patient will be in this afternoon @ 220

## 2022-12-13 NOTE — Progress Notes (Signed)
Established patient visit   Patient: [name]   DOB: 02/17/1940   82 y.o. Female  MRN: [id number] Visit Date: 12/13/2022  Today's healthcare provider: Alfredia Ferguson, PA-C   Chief Complaint  Patient presents with   Sore Throat    States she is a little better but throat is still hurting   Subjective     Pt was seen 12/07/22 for sore throat and an ED f/u for chest pain. She was found to have + strep on a rapid test and placed on amoxicillin.   Today, 6 days after starting antibiotic, pt reports still having a sore throat, she saw a white patch and is worried about thrush. She had a hard time with thrush in the past and is very worried about it. She also reports some transient L ear pain  Medications: Outpatient Medications Prior to Visit  Medication Sig   acetaminophen (TYLENOL) 325 MG tablet Take 2 tablets (650 mg total) by mouth every 6 (six) hours as needed for up to 30 doses for mild pain or moderate pain.   amLODipine (NORVASC) 5 MG tablet TAKE 1 TABLET BY MOUTH DAILY   amoxicillin (AMOXIL) 500 MG capsule Take 1 capsule (500 mg total) by mouth 3 (three) times daily for 10 days.   aspirin EC 81 MG tablet Take 81 mg by mouth daily. Swallow whole.   betamethasone valerate ointment (VALISONE) 0.1 % Apply a small amount to affected area topically every other day.   Blood Glucose Monitoring Suppl (ONE TOUCH ULTRA 2) w/Device KIT USE TO CHECK BLOOD SUGAR AS DIRECTED   calcium-vitamin D (OSCAL WITH D) 500-5 MG-MCG tablet Take 1 tablet by mouth daily.   cholecalciferol (VITAMIN D) 1000 UNITS tablet Take 1,000 Units by mouth daily.   Esomeprazole Magnesium (NEXIUM 24HR) 20 MG TBEC Take 1 tablet by mouth daily.   furosemide (LASIX) 20 MG tablet TAKE 1 TABLET BY MOUTH DAILY AS  NEEDED   Lancets (ONETOUCH ULTRASOFT) lancets USE AS DIRECTED 3 TIMES  DAILY   letrozole (FEMARA) 2.5 MG tablet Take 1 tablet (2.5 mg total) by mouth daily.   losartan (COZAAR) 50 MG tablet TAKE 1  TABLET BY MOUTH TWICE  DAILY   metFORMIN (GLUCOPHAGE-XR) 500 MG 24 hr tablet TAKE 1 TABLET BY MOUTH IN THE  MORNING AND 1 TABLET BY MOUTH AT BEDTIME   metoCLOPramide (REGLAN) 5 MG tablet TAKE 1 TABLET BY MOUTH TWICE  DAILY AS NEEDED   metoprolol tartrate (LOPRESSOR) 50 MG tablet Take 1 tablet (50 mg total) by mouth 2 (two) times daily.   Multiple Vitamin (MULTIVITAMIN ADULT PO) Take by mouth.   ondansetron (ZOFRAN) 4 MG tablet Take 1 tablet (4 mg total) by mouth every 6 (six) hours as needed.   ONETOUCH ULTRA test strip CHECK BLOOD SUGAR 3 TIMES DAILY  OR AS NEEDED   potassium chloride (KLOR-CON M) 10 MEQ tablet TAKE 1 TABLET BY MOUTH DAILY   rosuvastatin (CRESTOR) 20 MG tablet TAKE 1 TABLET BY MOUTH DAILY   thiamine (VITAMIN B-1) 100 MG tablet Take 100 mg by mouth every other day.   vitamin E 180 MG (400 UNITS) capsule Take 400 Units by mouth daily.   No facility-administered medications prior to visit.    Review of Systems  Constitutional:  Negative for fatigue and fever.  HENT:  Positive for sore throat.   Respiratory:  Negative for cough and shortness of breath.   Cardiovascular:  Negative for chest pain and leg  swelling.  Gastrointestinal:  Negative for abdominal pain.  Neurological:  Negative for dizziness and headaches.       Objective    BP (!) 144/82   Pulse 63   Temp 97.6 F (36.4 C)   Ht 5\' 1"  (1.549 m)   Wt 164 lb 4 oz (74.5 kg)   LMP 02/07/1992   SpO2 100%   BMI 31.03 kg/m    Physical Exam Vitals reviewed.  Constitutional:      Appearance: She is not ill-appearing.  HENT:     Head: Normocephalic.     Left Ear: Tympanic membrane normal.     Mouth/Throat:     Pharynx: Posterior oropharyngeal erythema present. No oropharyngeal exudate.  Eyes:     Conjunctiva/sclera: Conjunctivae normal.  Cardiovascular:     Rate and Rhythm: Normal rate.  Pulmonary:     Effort: Pulmonary effort is normal. No respiratory distress.  Neurological:     General: No focal  deficit present.     Mental Status: She is alert and oriented to person, place, and time.  Psychiatric:        Mood and Affect: Mood normal.        Behavior: Behavior normal.      No results found for any visits on 12/13/22.  Assessment & Plan    Strep pharyngitis  Antibiotic-induced yeast infection -     Fluconazole; Take 1 tablet (150 mg total) by mouth once for 1 dose.  Dispense: 1 tablet; Refill: 0   Advised patient to continue amoxicillin for treatment of strep.  On exam I do not see anything on her tongue that appears like thrush.  Patient is very concerned over getting thrush.  She reports she had in the past for very long time and did not know.  Advised that after the completion of her amoxicillin it is okay to take 1 tablet of Diflucan, sent today.  Return if symptoms worsen or fail to improve.       Alfredia Ferguson, PA-C  [facility] at [facility] [phone] (phone) [phone] (fax)  [facility]

## 2022-12-14 ENCOUNTER — Other Ambulatory Visit (HOSPITAL_BASED_OUTPATIENT_CLINIC_OR_DEPARTMENT_OTHER): Payer: Self-pay

## 2022-12-24 DIAGNOSIS — R072 Precordial pain: Secondary | ICD-10-CM | POA: Insufficient documentation

## 2022-12-24 NOTE — Progress Notes (Unsigned)
  Cardiology Office Note:   Date:  12/26/2022  ID:  SHUNDA HOERNER, DOB 1940/10/19, MRN [id number] PCP: [name], MD  [facility] Providers Cardiologist:  Rollene Rotunda, MD {  History of Present Illness:   [name] is a 82 y.o. female   who presents for followup of SVT. And evaluation of chest pain.   Since I saw her she has had treatment for breast cancer.  She had lumpectomy.  She is on Femer.  Patienta.  She has stage I.  Radiation has been adjusted but she did not want to do this.  She was in the ED in October for chest pain.  I reviewed these records for this visit.   There was no objective evidence of ischemia and this was thought to be non angina.   She describes a discomfort and soreness when she presses on her chest.  She can reproduce it with palpation.  It is not associate with nausea vomiting or diaphoresis.  She does get some shortness of breath.  She has not had any new PND or orthopnea.  She is worse when she is lying flat.  She did see her gastroenterologist and was thought not to have a GI etiology.  ROS: As stated in the HPI and negative for all other systems.  Studies Reviewed:    EKG:     Sinus rhythm, rate 67, axis within normal limits, intervals within normal limits, early transition lead V2, no acute ST-T wave changes.  12/06/2022.  Risk Assessment/Calculations:         Physical Exam:   VS:  BP 138/62 (BP Location: Left Arm, Patient Position: Sitting, Cuff Size: Normal)   Pulse 67   Ht 5\' 1"  (1.549 m)   Wt 163 lb 12.8 oz (74.3 kg)   LMP 02/07/1992   SpO2 98%   BMI 30.95 kg/m    Wt Readings from Last 3 Encounters:  12/26/22 163 lb 12.8 oz (74.3 kg)  12/13/22 164 lb 4 oz (74.5 kg)  12/07/22 164 lb 4 oz (74.5 kg)     GEN: Well nourished, well developed in no acute distress NECK: No JVD; No carotid bruits CARDIAC: RRR, no murmurs, rubs, gallops RESPIRATORY:  Clear to auscultation without rales, wheezing or rhonchi  ABDOMEN:  Soft, non-tender, non-distended EXTREMITIES:  No edema; No deformity   ASSESSMENT AND PLAN:   Chest pain :  The chest pain is atypical.  No change in therapy.  I think is probably musculoskeletal.  I gave her reassurance.  SVT: She has no symptoms related to this.  No change in therapy.  Carotid stenosis: She has known carotid stenosis and I will repeat carotid Dopplers as it has been a few years.     Follow up with me in six months.   Signed, Rollene Rotunda, MD  Sinus rhythm, rate 67, axis within normal limits, early transition lead V2 probable lead placement, no acute ST-T wave changes.

## 2022-12-26 ENCOUNTER — Encounter: Payer: Self-pay | Admitting: Cardiology

## 2022-12-26 ENCOUNTER — Other Ambulatory Visit: Payer: Self-pay | Admitting: Cardiology

## 2022-12-26 ENCOUNTER — Ambulatory Visit: Payer: Medicare Other | Attending: Cardiology | Admitting: Cardiology

## 2022-12-26 VITALS — BP 138/62 | HR 67 | Ht 61.0 in | Wt 163.8 lb

## 2022-12-26 DIAGNOSIS — R072 Precordial pain: Secondary | ICD-10-CM | POA: Diagnosis not present

## 2022-12-26 DIAGNOSIS — I6523 Occlusion and stenosis of bilateral carotid arteries: Secondary | ICD-10-CM | POA: Diagnosis not present

## 2022-12-26 DIAGNOSIS — I471 Supraventricular tachycardia, unspecified: Secondary | ICD-10-CM

## 2022-12-26 NOTE — Patient Instructions (Signed)
Medication Instructions:  No changes.  *If you need a refill on your cardiac medications before your next appointment, please call your pharmacy*   Testing/Procedures:  Your physician has requested that you have a carotid duplex. This test is an ultrasound of the carotid arteries in your neck. It looks at blood flow through these arteries that supply the brain with blood. Allow one hour for this exam. There are no restrictions or special instructions.    Follow-Up: At [facility], you and your health needs are our priority.  As part of our continuing mission to provide you with exceptional heart care, we have created designated Provider Care Teams.  These Care Teams include your primary Cardiologist (physician) and Advanced Practice Providers (APPs -  Physician Assistants and Nurse Practitioners) who all work together to provide you with the care you need, when you need it.  We recommend signing up for the patient portal called "MyChart".  Sign up information is provided on this After Visit Summary.  MyChart is used to connect with patients for Virtual Visits (Telemedicine).  Patients are able to view lab/test results, encounter notes, upcoming appointments, etc.  Non-urgent messages can be sent to your provider as well.   To learn more about what you can do with MyChart, go to [email or web address].    Your next appointment:   6 month(s)  Provider:   Rollene Rotunda, MD

## 2022-12-29 ENCOUNTER — Other Ambulatory Visit: Payer: Medicare Other

## 2022-12-29 ENCOUNTER — Ambulatory Visit: Payer: Medicare Other | Admitting: Hematology & Oncology

## 2023-01-09 ENCOUNTER — Other Ambulatory Visit: Payer: Medicare Other

## 2023-01-10 ENCOUNTER — Ambulatory Visit (HOSPITAL_COMMUNITY)
Admission: RE | Admit: 2023-01-10 | Discharge: 2023-01-10 | Disposition: A | Discharge status: Home / Self Care | Payer: Medicare Other | Source: Ambulatory Visit | Attending: Cardiology | Admitting: Cardiology

## 2023-01-10 DIAGNOSIS — I6523 Occlusion and stenosis of bilateral carotid arteries: Secondary | ICD-10-CM | POA: Diagnosis not present

## 2023-01-12 ENCOUNTER — Other Ambulatory Visit (INDEPENDENT_AMBULATORY_CARE_PROVIDER_SITE_OTHER): Payer: Medicare Other

## 2023-01-12 DIAGNOSIS — E669 Obesity, unspecified: Secondary | ICD-10-CM | POA: Diagnosis not present

## 2023-01-12 DIAGNOSIS — I10 Essential (primary) hypertension: Secondary | ICD-10-CM | POA: Diagnosis not present

## 2023-01-12 DIAGNOSIS — E559 Vitamin D deficiency, unspecified: Secondary | ICD-10-CM

## 2023-01-12 DIAGNOSIS — E1169 Type 2 diabetes mellitus with other specified complication: Secondary | ICD-10-CM

## 2023-01-12 DIAGNOSIS — E7800 Pure hypercholesterolemia, unspecified: Secondary | ICD-10-CM | POA: Diagnosis not present

## 2023-01-12 DIAGNOSIS — N289 Disorder of kidney and ureter, unspecified: Secondary | ICD-10-CM | POA: Diagnosis not present

## 2023-01-12 DIAGNOSIS — Z7984 Long term (current) use of oral hypoglycemic drugs: Secondary | ICD-10-CM

## 2023-01-12 LAB — CBC WITH DIFFERENTIAL/PLATELET
Basophils Absolute: 0.1 10*3/uL (ref 0.0–0.1)
Basophils Relative: 0.9 % (ref 0.0–3.0)
Eosinophils Absolute: 0.1 10*3/uL (ref 0.0–0.7)
Eosinophils Relative: 1.3 % (ref 0.0–5.0)
HCT: 37.9 % (ref 36.0–46.0)
Hemoglobin: 12.9 g/dL (ref 12.0–15.0)
Lymphocytes Relative: 27.8 % (ref 12.0–46.0)
Lymphs Abs: 2.6 10*3/uL (ref 0.7–4.0)
MCHC: 34 g/dL (ref 30.0–36.0)
MCV: 86 fL (ref 78.0–100.0)
Monocytes Absolute: 0.7 10*3/uL (ref 0.1–1.0)
Monocytes Relative: 7.9 % (ref 3.0–12.0)
Neutro Abs: 5.8 10*3/uL (ref 1.4–7.7)
Neutrophils Relative %: 62.1 % (ref 43.0–77.0)
Platelets: 247 10*3/uL (ref 150.0–400.0)
RBC: 4.41 Mil/uL (ref 3.87–5.11)
RDW: 12.8 % (ref 11.5–15.5)
WBC: 9.4 10*3/uL (ref 4.0–10.5)

## 2023-01-12 LAB — LIPID PANEL
Cholesterol: 177 mg/dL (ref 0–200)
HDL: 59.4 mg/dL (ref >39.00)
LDL Cholesterol: 91 mg/dL (ref 0–99)
NonHDL: 117.75
Total CHOL/HDL Ratio: 3
Triglycerides: 136 mg/dL (ref 0.0–149.0)
VLDL: 27.2 mg/dL (ref 0.0–40.0)

## 2023-01-12 LAB — COMPREHENSIVE METABOLIC PANEL
ALT: 18 U/L (ref 0–35)
AST: 17 U/L (ref 0–37)
Albumin: 4.5 g/dL (ref 3.5–5.2)
Alkaline Phosphatase: 67 U/L (ref 39–117)
BUN: 26 mg/dL — ABNORMAL HIGH (ref 6–23)
CO2: 28 meq/L (ref 19–32)
Calcium: 9.7 mg/dL (ref 8.4–10.5)
Chloride: 97 meq/L (ref 96–112)
Creatinine, Ser: 0.86 mg/dL (ref 0.40–1.20)
GFR: 63.2 mL/min (ref >60.00)
Glucose, Bld: 144 mg/dL — ABNORMAL HIGH (ref 70–99)
Potassium: 4.1 meq/L (ref 3.5–5.1)
Sodium: 135 meq/L (ref 135–145)
Total Bilirubin: 0.7 mg/dL (ref 0.2–1.2)
Total Protein: 7.2 g/dL (ref 6.0–8.3)

## 2023-01-12 LAB — MICROALBUMIN / CREATININE URINE RATIO
Creatinine,U: 82.8 mg/dL
Microalb Creat Ratio: 12.3 mg/g (ref 0.0–30.0)
Microalb, Ur: 10.2 mg/dL — ABNORMAL HIGH (ref 0.0–1.9)

## 2023-01-12 LAB — TSH: TSH: 2.09 u[IU]/mL (ref 0.35–5.50)

## 2023-01-12 LAB — VITAMIN D 25 HYDROXY (VIT D DEFICIENCY, FRACTURES): VITD: 42.36 ng/mL (ref 30.00–100.00)

## 2023-01-12 LAB — HEMOGLOBIN A1C: Hgb A1c MFr Bld: 6.7 % — ABNORMAL HIGH (ref 4.6–6.5)

## 2023-01-15 ENCOUNTER — Telehealth: Payer: Self-pay | Admitting: Cardiology

## 2023-01-15 NOTE — Telephone Encounter (Signed)
Called patient and discussed her results of her test.

## 2023-01-15 NOTE — Assessment & Plan Note (Signed)
Well controlled, no changes to meds. Encouraged heart healthy diet such as the DASH diet and exercise as tolerated.  °

## 2023-01-15 NOTE — Assessment & Plan Note (Signed)
hgba1c acceptable, minimize simple carbs. Increase exercise as tolerated. Continue current meds 

## 2023-01-15 NOTE — Assessment & Plan Note (Signed)
Encourage heart healthy diet such as MIND or DASH diet, increase exercise, avoid trans fats, simple carbohydrates and processed foods, consider a krill or fish or flaxseed oil cap daily.  Tolerating Rosuvastatin 

## 2023-01-15 NOTE — Telephone Encounter (Signed)
Patient would like a call back to discuss carotid results.

## 2023-01-15 NOTE — Assessment & Plan Note (Signed)
Supplement and monitor 

## 2023-01-16 ENCOUNTER — Other Ambulatory Visit (HOSPITAL_BASED_OUTPATIENT_CLINIC_OR_DEPARTMENT_OTHER): Payer: Self-pay

## 2023-01-16 ENCOUNTER — Encounter: Payer: Self-pay | Admitting: *Deleted

## 2023-01-16 ENCOUNTER — Other Ambulatory Visit: Payer: Self-pay

## 2023-01-16 ENCOUNTER — Ambulatory Visit (INDEPENDENT_AMBULATORY_CARE_PROVIDER_SITE_OTHER): Payer: Medicare Other | Admitting: Family Medicine

## 2023-01-16 ENCOUNTER — Encounter: Payer: Self-pay | Admitting: Family

## 2023-01-16 VITALS — BP 124/64 | HR 72 | Temp 97.7°F | Resp 18 | Ht 61.0 in | Wt 163.4 lb

## 2023-01-16 DIAGNOSIS — E519 Thiamine deficiency, unspecified: Secondary | ICD-10-CM

## 2023-01-16 DIAGNOSIS — E7800 Pure hypercholesterolemia, unspecified: Secondary | ICD-10-CM

## 2023-01-16 DIAGNOSIS — E1169 Type 2 diabetes mellitus with other specified complication: Secondary | ICD-10-CM

## 2023-01-16 DIAGNOSIS — R2681 Unsteadiness on feet: Secondary | ICD-10-CM | POA: Diagnosis not present

## 2023-01-16 DIAGNOSIS — M791 Myalgia, unspecified site: Secondary | ICD-10-CM

## 2023-01-16 DIAGNOSIS — E669 Obesity, unspecified: Secondary | ICD-10-CM

## 2023-01-16 DIAGNOSIS — R531 Weakness: Secondary | ICD-10-CM

## 2023-01-16 DIAGNOSIS — I10 Essential (primary) hypertension: Secondary | ICD-10-CM | POA: Diagnosis not present

## 2023-01-16 DIAGNOSIS — E559 Vitamin D deficiency, unspecified: Secondary | ICD-10-CM | POA: Diagnosis not present

## 2023-01-16 DIAGNOSIS — R102 Pelvic and perineal pain: Secondary | ICD-10-CM | POA: Diagnosis not present

## 2023-01-16 MED ORDER — HYOSCYAMINE SULFATE 0.125 MG SL SUBL
0.1250 mg | SUBLINGUAL_TABLET | SUBLINGUAL | 3 refills | Status: DC | PRN
Start: 2023-01-16 — End: 2023-09-12
  Filled 2023-01-16: qty 30, 5d supply, fill #0

## 2023-01-16 NOTE — Patient Instructions (Addendum)
Tetanus is due can get at pharmacy   RSV, Respiratory Syncitial Virus Vaccine, Arexvy vaccine at the pharmacy  Shingrix is the new shingles shot, 2 shots over 2-6 months, confirm coverage with insurance and document, then can return here for shots with nurse appt or at pharmacy   [employer]

## 2023-01-17 ENCOUNTER — Encounter: Payer: Self-pay | Admitting: Family Medicine

## 2023-01-17 LAB — URINE CULTURE
MICRO NUMBER:: 15831887
SPECIMEN QUALITY:: ADEQUATE

## 2023-01-17 LAB — URINALYSIS
Bilirubin Urine: NEGATIVE
Hgb urine dipstick: NEGATIVE
Ketones, ur: NEGATIVE
Leukocytes,Ua: NEGATIVE
Nitrite: NEGATIVE
Specific Gravity, Urine: 1.01 (ref 1.000–1.030)
Total Protein, Urine: NEGATIVE
Urine Glucose: NEGATIVE
Urobilinogen, UA: 0.2 (ref 0.0–1.0)
pH: 6.5 (ref 5.0–8.0)

## 2023-01-17 NOTE — Progress Notes (Signed)
Subjective:    [name], female    DOB: 1940-02-20, 82 y.o.   MRN: [id number]  Chief Complaint  Patient presents with   Follow-up    3 month    HPI Discussed the use of AI scribe software for clinical note transcription with the patient, who gave verbal consent to proceed.  History of Present Illness   The patient, with a past medical history of diabetes, kidney issues, and breast cancer (on Femara), presents with a chief complaint of constant abdominal pain in the lower left quadrant for about four weeks. The patient also reports a dry throat, which they initially thought was related to acid reflux, but their gastroenterologist does not believe it is stomach-related. Despite taking Nexium daily, the patient experiences occasional burning sensations, particularly towards the end of the day.  The patient also reports muscle weakness and joint pain, which they attribute to the Femara. They express concern about muscle loss and general fatigue, which has been affecting their daily activities. The patient also mentions experiencing thin stools and a feeling of incomplete bowel emptying, which has been ongoing for about a month. They deny any blood or black sticky stool.  The patient also reports a history of diverticulitis and feels that something is flaring up. They occasionally experience chills but deny any fever, nausea, or vomiting. They express concern about their kidney function due to their diabetes and report a fear of taking probiotics due to potential increased activity in their stomach and colon.        Past Medical History:  Diagnosis Date   Abdominal pain in female 03/18/2010   Qualifier: Diagnosis of  By: Leone Payor MD, Alfonse Ras E    Anemia 06/08/2014   Anxiety    Arthritis    Spinal Osteoarthritis   Breast cancer (HCC)    Carcinoid tumor of stomach    Cataract    Chest pain    Myoview 12/15 no ischemia.   Chronic kidney disease    Left kidney  smaller than right kidney   Constipation 11/21/2016   Diabetes mellitus type 2 in obese 09/05/2006   Qualifier: Diagnosis of  By: Charlsie Quest RMA, Lucy     Diabetic peripheral neuropathy (HCC) 10/29/2013   Encounter for preventative adult health care exam with abnormal findings 09/14/2013   Esophageal reflux    Gastric polyp    Fundic Gland   Gastroparesis    Headache(784.0)    Heart murmur    Echocardiogram 2/11: EF 60-65%, mild LAE, grade 1 diastolic dysfunction, aortic valve sclerosis, mean gradient 9 mm of mercury, PASP 34   Hematuria 03/16/2016   Iron deficiency anemia, unspecified    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   Leg swelling    bilateral   Neck pain 04/22/2015   PONV (postoperative nausea and vomiting)    pt states body temperature drops every time she has anesthesia; pt states only needs small amount of anesthesia   PSVT (paroxysmal supraventricular tachycardia) (HCC)    Pure hypercholesterolemia    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   Renal insufficiency 06/18/2019   pt states  L kidney function very low-functions at about 20%   Stroke [facility])    tia, 2014   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   Type II or unspecified type diabetes mellitus without mention of complication, not stated as uncontrolled    Unspecified essential hypertension    Unspecified hereditary and idiopathic peripheral neuropathy 10/29/2013   Vitamin D deficiency     Past Surgical History:  Procedure Laterality Date   BREAST BIOPSY Right 06/13/2022   [place] RT BREAST BX W LOC DEV 1ST LESION IMG BX SPEC US GUIDE 06/13/2022 [facility]MO[facility]   BREAST LUMPECTOMY Right 07/31/2022   Procedure: RIGHT BREAST LUMPECTOMY;  Surgeon: Abigail Miyamoto, MD;  Location: [facility];  Service: General;  Laterality: Right;   CHOLECYSTECTOMY  1993   COLONOSCOPY  11/11/2010   diverticulosis   DILATATION & CURRETTAGE/HYSTEROSCOPY WITH RESECTOCOPE N/A 02/25/2013   Procedure: Attempted hysteroscopy with uterine perforation;  Surgeon: Jacqualin Combes de Gwenevere Ghazi, MD;  Location: [facility];  Service: Gynecology;  Laterality: N/A;   ESOPHAGOGASTRODUODENOSCOPY  08/29/2010; 09/15/2010   Carcinoid tumor less than 1 cm in July 2012 not seen in August 2012 , gastritis, fundic gland polyps   ESOPHAGOGASTRODUODENOSCOPY  05/16/2011   ESOPHAGOGASTRODUODENOSCOPY  06/14/2012   EUS  12/15/2010   Procedure: UPPER ENDOSCOPIC ULTRASOUND (EUS) LINEAR;  Surgeon: Rob Bunting, MD;  Location: [facility];  Service: Endoscopy;  Laterality: N/A;   EYE SURGERY Bilateral    Bi lateral cateracts and bi lateral laser   LAPAROSCOPY N/A 02/25/2013   Procedure: Cystoscopy and laparoscopy with fulguration of uterine serosa;  Surgeon: Jacqualin Combes de Gwenevere Ghazi, MD;  Location: [facility];  Service: Gynecology;  Laterality: N/A;   TONSILLECTOMY      Family History  Problem Relation Age of Onset   Diabetes Mother    Stroke Father        deceased age 44   Heart disease Sister        deceased MI age 86   Diabetes Sister    Heart disease Sister    Hypertension Sister    Hyperlipidemia Sister    Diabetes Sister    Heart disease Sister    Hypertension Sister    Hyperlipidemia Sister    Heart disease Brother        deceased MI age 14   Intellectual disability Brother    Diabetes Brother    Heart disease Brother    Hypertension Brother    Hyperlipidemia Brother    Diabetes Maternal Grandmother    Hypertension Paternal Grandmother    Colon cancer Neg Hx    Esophageal cancer Neg Hx    Stomach cancer Neg Hx    Rectal cancer Neg Hx     Social History   Socioeconomic History   Marital status: Widowed    Spouse name: Not on file   Number of children: 0   Years of education: college   Highest education level: Not on file  Occupational History   Occupation: retired  Tobacco Use   Smoking status: Never   Smokeless tobacco: Never   Tobacco comments:    Never used tobacco  Vaping Use   Vaping status: Never Used  Substance and Sexual  Activity   Alcohol use: No    Alcohol/week: 0.0 standard drinks of alcohol   Drug use: No   Sexual activity: Not Currently    Partners: Male    Birth control/protection: Post-menopausal    Comment: lives alone, no dietary restrictions except avoid fresh veg, fruit, whole grains  Other Topics Concern   Not on file  Social History Narrative   Patient was married ([name]) - widow   Patient does not have any children.   Patient is right-handed.   Patient has a BA degree.   One caffeine drink daily    Social Determinants of Health   Financial Resource Strain: Low Risk  (  09/07/2022)   Overall Financial Resource Strain (CARDIA)    Difficulty of Paying Living Expenses: Not very hard  Food Insecurity: No Food Insecurity (06/30/2022)   Hunger Vital Sign    Worried About Running Out of Food in the Last Year: Never true    Ran Out of Food in the Last Year: Never true  Transportation Needs: No Transportation Needs (06/30/2022)   PRAPARE - Administrator, Civil Service (Medical): No    Lack of Transportation (Non-Medical): No  Recent Concern: Transportation Needs - Unmet Transportation Needs (06/30/2022)   PRAPARE - Administrator, Civil Service (Medical): Yes    Lack of Transportation (Non-Medical): No  Physical Activity: Inactive (09/07/2022)   Exercise Vital Sign    Days of Exercise per Week: 0 days    Minutes of Exercise per Session: 0 min  Stress: No Stress Concern Present (09/07/2022)   [employer] of Occupational Health - Occupational Stress Questionnaire    Feeling of Stress : Only a little  Recent Concern: Stress - Stress Concern Present (07/10/2022)   [employer] of Occupational Health - Occupational Stress Questionnaire    Feeling of Stress : To some extent  Social Connections: Moderately Isolated (09/07/2022)   Social Connection and Isolation Panel [NHANES]    Frequency of Communication with Friends and Family: More than three times a week    Frequency  of Social Gatherings with Friends and Family: Once a week    Attends Religious Services: More than 4 times per year    Active Member of [employer] or Organizations: No    Attends Banker Meetings: Never    Marital Status: Widowed  Intimate Partner Violence: Not At Risk (06/30/2022)   Humiliation, Afraid, Rape, and Kick questionnaire    Fear of Current or Ex-Partner: No    Emotionally Abused: No    Physically Abused: No    Sexually Abused: No    Outpatient Medications Prior to Visit  Medication Sig Dispense Refill   acetaminophen (TYLENOL) 325 MG tablet Take 2 tablets (650 mg total) by mouth every 6 (six) hours as needed for up to 30 doses for mild pain or moderate pain. 30 tablet 0   amLODipine (NORVASC) 5 MG tablet TAKE 1 TABLET BY MOUTH DAILY 100 tablet 2   aspirin EC 81 MG tablet Take 81 mg by mouth daily. Swallow whole.     betamethasone valerate ointment (VALISONE) 0.1 % Apply a small amount to affected area topically every other day. 45 g 1   Blood Glucose Monitoring Suppl (ONE TOUCH ULTRA 2) w/Device KIT USE TO CHECK BLOOD SUGAR AS DIRECTED 1 kit 0   calcium-vitamin D (OSCAL WITH D) 500-5 MG-MCG tablet Take 1 tablet by mouth daily.     cholecalciferol (VITAMIN D) 1000 UNITS tablet Take 1,000 Units by mouth daily.     Esomeprazole Magnesium (NEXIUM 24HR) 20 MG TBEC Take 1 tablet by mouth daily.     furosemide (LASIX) 20 MG tablet TAKE 1 TABLET BY MOUTH DAILY AS  NEEDED 100 tablet 2   Lancets (ONETOUCH ULTRASOFT) lancets USE AS DIRECTED 3 TIMES  DAILY 300 each 3   letrozole (FEMARA) 2.5 MG tablet Take 1 tablet (2.5 mg total) by mouth daily. 90 tablet 3   losartan (COZAAR) 50 MG tablet TAKE 1 TABLET BY MOUTH TWICE  DAILY 200 tablet 2   metFORMIN (GLUCOPHAGE-XR) 500 MG 24 hr tablet TAKE 1 TABLET BY MOUTH IN THE  MORNING AND 1 TABLET  BY MOUTH AT BEDTIME 200 tablet 2   metoCLOPramide (REGLAN) 5 MG tablet TAKE 1 TABLET BY MOUTH TWICE  DAILY AS NEEDED 180 tablet 1   metoprolol  tartrate (LOPRESSOR) 50 MG tablet TAKE 1 TABLET BY MOUTH TWICE  DAILY 200 tablet 3   Multiple Vitamin (MULTIVITAMIN ADULT PO) Take by mouth.     ondansetron (ZOFRAN) 4 MG tablet Take 1 tablet (4 mg total) by mouth every 6 (six) hours as needed. 30 tablet 5   ONETOUCH ULTRA test strip CHECK BLOOD SUGAR 3 TIMES DAILY  OR AS NEEDED 300 strip 2   potassium chloride (KLOR-CON M) 10 MEQ tablet TAKE 1 TABLET BY MOUTH DAILY 100 tablet 2   rosuvastatin (CRESTOR) 20 MG tablet TAKE 1 TABLET BY MOUTH DAILY 100 tablet 2   thiamine (VITAMIN B-1) 100 MG tablet Take 100 mg by mouth every other day.     vitamin E 180 MG (400 UNITS) capsule Take 400 Units by mouth daily.     No facility-administered medications prior to visit.    Allergies  Allergen Reactions   Tramadol Other (See Comments)    Dizziness     Review of Systems  Constitutional:  Negative for fever and malaise/fatigue.  HENT:  Negative for congestion.   Eyes:  Negative for blurred vision.  Respiratory:  Negative for shortness of breath.   Cardiovascular:  Negative for chest pain, palpitations and leg swelling.  Gastrointestinal:  Negative for abdominal pain, blood in stool and nausea.  Genitourinary:  Negative for dysuria and frequency.  Musculoskeletal:  Negative for falls.  Skin:  Negative for rash.  Neurological:  Negative for dizziness, loss of consciousness and headaches.  Endo/Heme/Allergies:  Negative for environmental allergies.  Psychiatric/Behavioral:  Negative for depression. The patient is not nervous/anxious.        Objective:    Physical Exam  BP 124/64 (BP Location: Left Arm, Patient Position: Sitting, Cuff Size: Normal)   Pulse 72   Temp 97.7 F (36.5 C) (Oral)   Resp 18   Ht 5\' 1"  (1.549 m)   Wt 163 lb 6.4 oz (74.1 kg)   LMP 02/07/1992   SpO2 99%   BMI 30.87 kg/m  Wt Readings from Last 3 Encounters:  01/16/23 163 lb 6.4 oz (74.1 kg)  12/26/22 163 lb 12.8 oz (74.3 kg)  12/13/22 164 lb 4 oz (74.5 kg)     Diabetic Foot Exam - Simple   No data filed    Lab Results  Component Value Date   WBC 9.4 01/12/2023   HGB 12.9 01/12/2023   HCT 37.9 01/12/2023   PLT 247.0 01/12/2023   GLUCOSE 144 (H) 01/12/2023   CHOL 177 01/12/2023   TRIG 136.0 01/12/2023   HDL 59.40 01/12/2023   LDLDIRECT 95.0 03/25/2020   LDLCALC 91 01/12/2023   ALT 18 01/12/2023   AST 17 01/12/2023   NA 135 01/12/2023   K 4.1 01/12/2023   CL 97 01/12/2023   CREATININE 0.86 01/12/2023   BUN 26 (H) 01/12/2023   CO2 28 01/12/2023   TSH 2.09 01/12/2023   INR 0.88 01/16/2011   HGBA1C 6.7 (H) 01/12/2023   MICROALBUR 10.2 (H) 01/12/2023    Lab Results  Component Value Date   TSH 2.09 01/12/2023   Lab Results  Component Value Date   WBC 9.4 01/12/2023   HGB 12.9 01/12/2023   HCT 37.9 01/12/2023   MCV 86.0 01/12/2023   PLT 247.0 01/12/2023   Lab Results  Component Value Date  NA 135 01/12/2023   K 4.1 01/12/2023   CHLORIDE 101 07/08/2015   CO2 28 01/12/2023   GLUCOSE 144 (H) 01/12/2023   BUN 26 (H) 01/12/2023   CREATININE 0.86 01/12/2023   BILITOT 0.7 01/12/2023   ALKPHOS 67 01/12/2023   AST 17 01/12/2023   ALT 18 01/12/2023   PROT 7.2 01/12/2023   ALBUMIN 4.5 01/12/2023   CALCIUM 9.7 01/12/2023   ANIONGAP 12 12/06/2022   EGFR 75 (L) 07/08/2015   GFR 63.20 01/12/2023   Lab Results  Component Value Date   CHOL 177 01/12/2023   Lab Results  Component Value Date   HDL 59.40 01/12/2023   Lab Results  Component Value Date   LDLCALC 91 01/12/2023   Lab Results  Component Value Date   TRIG 136.0 01/12/2023   Lab Results  Component Value Date   CHOLHDL 3 01/12/2023   Lab Results  Component Value Date   HGBA1C 6.7 (H) 01/12/2023       Assessment & Plan:  Essential hypertension Assessment & Plan: Well controlled, no changes to meds. Encouraged heart healthy diet such as the DASH diet and exercise as tolerated.    Orders: -     Comprehensive metabolic panel; Future -     CBC  with Differential/Platelet; Future -     TSH; Future  HYPERCHOLESTEROLEMIA Assessment & Plan: Encourage heart healthy diet such as MIND or DASH diet, increase exercise, avoid trans fats, simple carbohydrates and processed foods, consider a krill or fish or flaxseed oil cap daily. Tolerating Rosuvastatin  Orders: -     Lipid panel; Future  Type 2 diabetes mellitus with obesity (HCC) Assessment & Plan: hgba1c acceptable, minimize simple carbs. Increase exercise as tolerated. Continue current meds  Orders: -     Hemoglobin A1c; Future  Vitamin D deficiency Assessment & Plan: Supplement and monitor   Orders: -     VITAMIN D 25 Hydroxy (Vit-D Deficiency, Fractures); Future  Myalgia -     Ambulatory referral to Physical Therapy  Unsteady gait -     Ambulatory referral to Physical Therapy  Weakness -     Ambulatory referral to Physical Therapy  Thiamine deficiency -     Vitamin B1; Future  Pelvic pain -     Urinalysis -     Urine Culture  Other orders -     Hyoscyamine Sulfate; Place 1 tablet (0.125 mg total) under the tongue every 4 (four) hours as needed.  Dispense: 30 tablet; Refill: 3    Assessment and Plan    Gastroesophageal Reflux Disease (GERD) Reports throat dryness and occasional burning despite daily Nexium. No significant reflux symptoms. -Continue Nexium daily. -Consider adding Pepcid at night if symptoms persist.  Abdominal Pain Reports left lower quadrant pain for the past four weeks. History of diverticulosis. No fever, but occasional chills. -Trial of hyoscyamine for pain management. -If pain persists or worsens, consider CT scan of abdomen.  Change in Bowel Habits Reports thin stools and incomplete evacuation for the past month. No visible blood. -Monitor symptoms. -If symptoms persist or worsen, consider further investigation.  Hyperglycemia A1c of 6.7. -Continue dietary management focusing on limiting carbohydrates and pairing carbohydrates  with protein. -Monitor A1c closely.  Renal Function Slightly elevated BUN. -Continue hydration and Losartan twice daily. -Monitor renal function closely.  Musculoskeletal Pain Reports generalized muscle pain and joint stiffness. -Referral to physical therapy. -Consider consultation with sports medicine or orthopedics if pain becomes severe.  General Health Maintenance -Continue hydration  and exercise. -Consider chair yoga for strength and balance. -Schedule follow-up in three months with lab work prior to visit. -Consider tetanus, RSV, and shingles vaccinations. -Schedule mammogram.         Danise Edge, MD

## 2023-01-18 ENCOUNTER — Telehealth: Payer: Self-pay | Admitting: Family Medicine

## 2023-01-18 NOTE — Telephone Encounter (Signed)
Notified pt of urine culture result and she voices understanding.  Pt states she has decided to have hip xray PCP recommended at recent visit.  Please place order and I will let pt know once it has been placed.

## 2023-01-18 NOTE — Telephone Encounter (Signed)
Patient called and would like for a nurse to give her a call to discuss lab results. Please call and advise.

## 2023-01-21 ENCOUNTER — Other Ambulatory Visit: Payer: Self-pay | Admitting: Family Medicine

## 2023-01-21 DIAGNOSIS — M25552 Pain in left hip: Secondary | ICD-10-CM

## 2023-01-22 ENCOUNTER — Encounter: Payer: Self-pay | Admitting: Hematology & Oncology

## 2023-01-22 ENCOUNTER — Telehealth: Payer: Self-pay

## 2023-01-22 ENCOUNTER — Inpatient Hospital Stay: Payer: Medicare Other | Attending: Hematology & Oncology

## 2023-01-22 ENCOUNTER — Inpatient Hospital Stay: Payer: Medicare Other | Admitting: Hematology & Oncology

## 2023-01-22 ENCOUNTER — Encounter: Payer: Self-pay | Admitting: *Deleted

## 2023-01-22 ENCOUNTER — Other Ambulatory Visit: Payer: Self-pay

## 2023-01-22 ENCOUNTER — Ambulatory Visit (HOSPITAL_BASED_OUTPATIENT_CLINIC_OR_DEPARTMENT_OTHER)
Admission: RE | Admit: 2023-01-22 | Discharge: 2023-01-22 | Disposition: A | Discharge status: Home / Self Care | Payer: Medicare Other | Source: Ambulatory Visit | Attending: Family Medicine | Admitting: Family Medicine

## 2023-01-22 VITALS — BP 106/57 | HR 58 | Temp 97.7°F | Resp 18 | Ht 61.0 in | Wt 163.0 lb

## 2023-01-22 DIAGNOSIS — Z7982 Long term (current) use of aspirin: Secondary | ICD-10-CM | POA: Diagnosis not present

## 2023-01-22 DIAGNOSIS — Z7984 Long term (current) use of oral hypoglycemic drugs: Secondary | ICD-10-CM | POA: Diagnosis not present

## 2023-01-22 DIAGNOSIS — D509 Iron deficiency anemia, unspecified: Secondary | ICD-10-CM | POA: Insufficient documentation

## 2023-01-22 DIAGNOSIS — E119 Type 2 diabetes mellitus without complications: Secondary | ICD-10-CM | POA: Insufficient documentation

## 2023-01-22 DIAGNOSIS — M25552 Pain in left hip: Secondary | ICD-10-CM | POA: Insufficient documentation

## 2023-01-22 DIAGNOSIS — C50411 Malignant neoplasm of upper-outer quadrant of right female breast: Secondary | ICD-10-CM | POA: Insufficient documentation

## 2023-01-22 DIAGNOSIS — Z79899 Other long term (current) drug therapy: Secondary | ICD-10-CM | POA: Insufficient documentation

## 2023-01-22 DIAGNOSIS — Z79811 Long term (current) use of aromatase inhibitors: Secondary | ICD-10-CM | POA: Diagnosis not present

## 2023-01-22 DIAGNOSIS — Z170 Estrogen receptor positive status [ER+]: Secondary | ICD-10-CM | POA: Insufficient documentation

## 2023-01-22 LAB — CMP (CANCER CENTER ONLY)
ALT: 20 U/L (ref 0–44)
AST: 20 U/L (ref 15–41)
Albumin: 4.4 g/dL (ref 3.5–5.0)
Alkaline Phosphatase: 58 U/L (ref 38–126)
Anion gap: 7 (ref 5–15)
BUN: 22 mg/dL (ref 8–23)
CO2: 27 mmol/L (ref 22–32)
Calcium: 10.1 mg/dL (ref 8.9–10.3)
Chloride: 98 mmol/L (ref 98–111)
Creatinine: 0.91 mg/dL (ref 0.44–1.00)
GFR, Estimated: >60 mL/min (ref >60)
Glucose, Bld: 182 mg/dL — ABNORMAL HIGH (ref 70–99)
Potassium: 4.4 mmol/L (ref 3.5–5.1)
Sodium: 132 mmol/L — ABNORMAL LOW (ref 135–145)
Total Bilirubin: 0.6 mg/dL (ref <1.2)
Total Protein: 7.3 g/dL (ref 6.5–8.1)

## 2023-01-22 LAB — CBC WITH DIFFERENTIAL (CANCER CENTER ONLY)
Abs Immature Granulocytes: 0.07 10*3/uL (ref 0.00–0.07)
Basophils Absolute: 0.1 10*3/uL (ref 0.0–0.1)
Basophils Relative: 1 %
Eosinophils Absolute: 0.1 10*3/uL (ref 0.0–0.5)
Eosinophils Relative: 1 %
HCT: 35.9 % — ABNORMAL LOW (ref 36.0–46.0)
Hemoglobin: 12 g/dL (ref 12.0–15.0)
Immature Granulocytes: 1 %
Lymphocytes Relative: 28 %
Lymphs Abs: 2.6 10*3/uL (ref 0.7–4.0)
MCH: 28.4 pg (ref 26.0–34.0)
MCHC: 33.4 g/dL (ref 30.0–36.0)
MCV: 85.1 fL (ref 80.0–100.0)
Monocytes Absolute: 0.8 10*3/uL (ref 0.1–1.0)
Monocytes Relative: 8 %
Neutro Abs: 5.6 10*3/uL (ref 1.7–7.7)
Neutrophils Relative %: 61 %
Platelet Count: 258 10*3/uL (ref 150–400)
RBC: 4.22 MIL/uL (ref 3.87–5.11)
RDW: 12.2 % (ref 11.5–15.5)
WBC Count: 9.2 10*3/uL (ref 4.0–10.5)
nRBC: 0 % (ref 0.0–0.2)

## 2023-01-22 LAB — IRON AND IRON BINDING CAPACITY (CC-WL,HP ONLY)
Iron: 69 ug/dL (ref 28–170)
Saturation Ratios: 19 % (ref 10.4–31.8)
TIBC: 365 ug/dL (ref 250–450)
UIBC: 296 ug/dL (ref 148–442)

## 2023-01-22 LAB — FERRITIN: Ferritin: 198 ng/mL (ref 11–307)

## 2023-01-22 LAB — LACTATE DEHYDROGENASE: LDH: 135 U/L (ref 98–192)

## 2023-01-22 NOTE — Telephone Encounter (Signed)
Pt came into the office and picked up order for her left hip xray. Instructed patient she could go downstairs and schedule her appointment

## 2023-01-22 NOTE — Progress Notes (Signed)
Patient is not feeling well. Side effects possibly from AI. Will hold for 6 weeks and if there is resolution of symptoms, she will be restarted on an alternate AI.   Oncology Nurse Navigator Documentation     01/22/2023   10:00 AM  Oncology Nurse Navigator Flowsheets  Navigator Follow Up Date: 03/06/2023  Navigator Follow Up Reason: Follow-up Appointment  Navigator Location [facility]  Navigator Encounter Type Follow-up Appt  Patient Visit Type MedOnc  Treatment Phase Active Tx  Barriers/Navigation Needs No Barriers At This Time  Interventions None Required  Acuity Level 1-No Barriers  Support Groups/Services Friends and Family  Time Spent with Patient 15

## 2023-01-22 NOTE — Telephone Encounter (Signed)
Called patient. No answer. LVM

## 2023-01-22 NOTE — Telephone Encounter (Signed)
-----   Message from Josph Macho sent at 01/22/2023 12:51 PM EST ----- Please call and let her know that the iron levels look okay.  Thanks.  [name]

## 2023-01-22 NOTE — Telephone Encounter (Signed)
Advised via MyChart.

## 2023-01-22 NOTE — Progress Notes (Addendum)
Hematology and Oncology Follow Up Visit  DEVYNE BREIDINGER [id number] 1940/02/14 82 y.o. 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 Stancato is back for follow-up.  She is not feeling well.  She thinks this is the Femara.  As such, we will stop the Femara.  I told her that it is not a problem to stop the Femara for 6 weeks.  If she feels better, then we will get her on another aromatase inhibitor.  She has had no problems with bleeding.  There is been no change in bowel or bladder habits.  She does have some myalgias and arthralgias.  Patient does have diabetes.  She is trying to control this.  She has had no fever.  She has had no rashes.  There is been no leg swelling.  Her last iron studies that were done back in September showed a ferritin of 212 with an iron saturation of 18%.  Overall, I would have to say that her performance status is probably ECOG 1.    Medications:  Current Outpatient Medications:    acetaminophen (TYLENOL) 325 MG tablet, Take 2 tablets (650 mg total) by mouth every 6 (six) hours as needed for up to 30 doses for mild pain or moderate pain., Disp: 30 tablet, Rfl: 0   amLODipine (NORVASC) 5 MG tablet, TAKE 1 TABLET BY MOUTH DAILY, Disp: 100 tablet, Rfl: 2   aspirin EC 81 MG tablet, Take 81 mg by mouth daily. Swallow whole., Disp: , Rfl:    betamethasone valerate ointment (VALISONE) 0.1 %, Apply a small amount to affected area topically every other day., Disp: 45 g, Rfl: 1   Blood Glucose Monitoring Suppl (ONE TOUCH ULTRA 2) w/Device KIT, USE TO CHECK BLOOD SUGAR AS DIRECTED, Disp: 1 kit, Rfl: 0   calcium-vitamin D (OSCAL WITH D) 500-5 MG-MCG  tablet, Take 1 tablet by mouth daily., Disp: , Rfl:    cholecalciferol (VITAMIN D) 1000 UNITS tablet, Take 1,000 Units by mouth daily., Disp: , Rfl:    Esomeprazole Magnesium (NEXIUM 24HR) 20 MG TBEC, Take 1 tablet by mouth daily., Disp: , Rfl:    furosemide (LASIX) 20 MG tablet, TAKE 1 TABLET BY MOUTH DAILY AS  NEEDED, Disp: 100 tablet, Rfl: 2   hyoscyamine (LEVSIN SL) 0.125 MG SL tablet, Place 1 tablet (0.125 mg total) under the tongue every 4 (four) hours as needed., Disp: 30 tablet, Rfl: 3   Lancets (ONETOUCH ULTRASOFT) lancets, USE AS DIRECTED 3 TIMES  DAILY, Disp: 300 each, Rfl: 3   letrozole (FEMARA) 2.5 MG tablet, Take 1 tablet (2.5 mg total) by mouth daily., Disp: 90 tablet, Rfl: 3   losartan (COZAAR) 50 MG tablet, TAKE 1 TABLET BY MOUTH TWICE  DAILY, Disp: 200 tablet, Rfl: 2   metFORMIN (GLUCOPHAGE-XR) 500 MG 24 hr tablet, TAKE 1 TABLET BY MOUTH IN THE  MORNING AND 1 TABLET BY MOUTH AT BEDTIME, Disp: 200 tablet, Rfl: 2   metoCLOPramide (REGLAN) 5 MG tablet, TAKE 1 TABLET BY MOUTH TWICE  DAILY AS NEEDED, Disp: 180 tablet, Rfl: 1   metoprolol tartrate (LOPRESSOR) 50 MG tablet, TAKE 1 TABLET BY MOUTH TWICE  DAILY, Disp: 200  tablet, Rfl: 3   Multiple Vitamin (MULTIVITAMIN ADULT PO), Take by mouth., Disp: , Rfl:    ondansetron (ZOFRAN) 4 MG tablet, Take 1 tablet (4 mg total) by mouth every 6 (six) hours as needed., Disp: 30 tablet, Rfl: 5   ONETOUCH ULTRA test strip, CHECK BLOOD SUGAR 3 TIMES DAILY  OR AS NEEDED, Disp: 300 strip, Rfl: 2   potassium chloride (KLOR-CON M) 10 MEQ tablet, TAKE 1 TABLET BY MOUTH DAILY, Disp: 100 tablet, Rfl: 2   rosuvastatin (CRESTOR) 20 MG tablet, TAKE 1 TABLET BY MOUTH DAILY, Disp: 100 tablet, Rfl: 2   thiamine (VITAMIN B-1) 100 MG tablet, Take 100 mg by mouth every other day., Disp: , Rfl:    vitamin E 180 MG (400 UNITS) capsule, Take 400 Units by mouth daily., Disp: , Rfl:   Allergies:  Allergies  Allergen Reactions   Tramadol Other (See Comments)     Dizziness     Past Medical History, Surgical history, Social history, and Family History were reviewed and updated.  Review of Systems: Review of Systems  Constitutional: Negative.   HENT:  Negative.    Eyes: Negative.   Respiratory: Negative.    Cardiovascular: Negative.   Gastrointestinal: Negative.   Endocrine: Negative.   Genitourinary: Negative.    Musculoskeletal: Negative.   Skin: Negative.   Neurological: Negative.   Hematological: Negative.   Psychiatric/Behavioral: Negative.      Physical Exam:  height is 5\' 1"  (1.549 m) and weight is 163 lb (73.9 kg). Her oral temperature is 97.7 F (36.5 C). Her blood pressure is 106/57 (abnormal) and her pulse is 58 (abnormal). Her respiration is 18 and oxygen saturation is 98%.   Wt Readings from Last 3 Encounters:  01/22/23 163 lb (73.9 kg)  01/16/23 163 lb 6.4 oz (74.1 kg)  12/26/22 163 lb 12.8 oz (74.3 kg)    Physical Exam Vitals reviewed.  Constitutional:      Comments: Left breast with no masses, edema or erythema.  There is no left axillary adenopathy.  Right breast shows the lumpectomy incision  at about the 9 o'clock position.   There is no distinct mass.  There is no palpable right axillary adenopathy.  HENT:     Head: Normocephalic and atraumatic.  Eyes:     Pupils: Pupils are equal, round, and reactive to light.  Cardiovascular:     Rate and Rhythm: Normal rate and regular rhythm.     Heart sounds: Normal heart sounds.  Pulmonary:     Effort: Pulmonary effort is normal.     Breath sounds: Normal breath sounds.  Abdominal:     General: Bowel sounds are normal.     Palpations: Abdomen is soft.  Musculoskeletal:        General: No tenderness or deformity. Normal range of motion.     Cervical back: Normal range of motion.  Lymphadenopathy:     Cervical: No cervical adenopathy.  Skin:    General: Skin is warm and dry.     Findings: No erythema or rash.  Neurological:     Mental Status: She is alert and  oriented to person, place, and time.  Psychiatric:        Behavior: Behavior normal.        Thought Content: Thought content normal.        Judgment: Judgment normal.     Lab Results  Component Value Date   WBC 9.2 01/22/2023   HGB 12.0 01/22/2023   HCT 35.9 (  L) 01/22/2023   MCV 85.1 01/22/2023   PLT 258 01/22/2023     Chemistry      Component Value Date/Time   NA 132 (L) 01/22/2023 0927   NA 136 05/08/2016 1305   NA 134 (L) 07/08/2015 1149   K 4.4 01/22/2023 0927   K 4.2 05/08/2016 1305   K 4.4 07/08/2015 1149   CL 98 01/22/2023 0927   CL 102 05/08/2016 1305   CO2 27 01/22/2023 0927   CO2 26 05/08/2016 1305   CO2 25 07/08/2015 1149   BUN 22 01/22/2023 0927   BUN 16 05/08/2016 1305   BUN 15.5 07/08/2015 1149   CREATININE 0.91 01/22/2023 0927   CREATININE 0.91 08/05/2021 1415   CREATININE 0.8 07/08/2015 1149      Component Value Date/Time   CALCIUM 10.1 01/22/2023 0927   CALCIUM 9.4 05/08/2016 1305   CALCIUM 10.0 07/08/2015 1149   ALKPHOS 58 01/22/2023 0927   ALKPHOS 56 05/08/2016 1305   ALKPHOS 62 07/08/2015 1149   AST 20 01/22/2023 0927   AST 24 07/08/2015 1149   ALT 20 01/22/2023 0927   ALT 29 05/08/2016 1305   ALT 30 07/08/2015 1149   BILITOT 0.6 01/22/2023 0927   BILITOT 0.64 07/08/2015 1149      Impression and Plan: Ms. Edstrom is a very charming 82 year old [place] female.  She now has what I had to believe is going to be a stage I breast cancer.  I see nothing on the pathology so far that would consider this to be an aggressive tumor.  I think that the proliferation marker clearly is a positive factor.  She has a very good prognostic breast cancer.  Again, she has declined radiation therapy.    I do not see a problem to her being off the Femara for 6 weeks.  I have recommended that she is also complaining of some pain at the lumpectomy site.  I suspect this is probably neuropathic type pain..  She is also having some abdominal issues.  She has had  diverticulosis.  We will see her back in 6 weeks and see how she is feeling.  I think her iron studies will be critical.      Josph Macho, MD 12/16/202410:41 AM

## 2023-01-23 ENCOUNTER — Telehealth: Payer: Self-pay | Admitting: Internal Medicine

## 2023-01-23 NOTE — Telephone Encounter (Signed)
Patient called and stated that she was having right side abdominal pain for 3 weeks now. Patient is requesting a call back. Please advise.

## 2023-01-23 NOTE — Telephone Encounter (Signed)
Patient is calling back requesting a call to go over her symptoms below.

## 2023-01-23 NOTE — Telephone Encounter (Signed)
Left message for pt to call back  °

## 2023-01-24 NOTE — Telephone Encounter (Signed)
Pt stated that she has been having abdominal pain for the last three  weeks, Alternating between diarrhea and constipation: Last BM yesterday, Pt was scheduled for an office visit on 02/01/2023 at 1:30 PM to see [facility].  Pt made aware. Pt verbalized understanding with all questions answered.

## 2023-01-25 ENCOUNTER — Telehealth: Payer: Self-pay | Admitting: Emergency Medicine

## 2023-01-25 NOTE — Telephone Encounter (Signed)
Copied from CRM [id number]. Topic: Clinical - Lab/Test Results >> Jan 25, 2023 10:23 AM [name] wrote: Reason for CRM: pt called to request results for Hip xray. Pt is requesting a call back. [phone]

## 2023-01-25 NOTE — Telephone Encounter (Signed)
Called patient and let her know we called the reading to expedite getting her xray read by the radiologist

## 2023-02-01 ENCOUNTER — Encounter: Payer: Self-pay | Admitting: Gastroenterology

## 2023-02-01 ENCOUNTER — Ambulatory Visit: Payer: Medicare Other | Admitting: Gastroenterology

## 2023-02-01 VITALS — BP 122/68 | HR 64 | Ht 61.0 in | Wt 165.0 lb

## 2023-02-01 DIAGNOSIS — R10814 Left lower quadrant abdominal tenderness: Secondary | ICD-10-CM

## 2023-02-01 DIAGNOSIS — Z8719 Personal history of other diseases of the digestive system: Secondary | ICD-10-CM | POA: Diagnosis not present

## 2023-02-01 DIAGNOSIS — K5909 Other constipation: Secondary | ICD-10-CM | POA: Diagnosis not present

## 2023-02-01 DIAGNOSIS — K219 Gastro-esophageal reflux disease without esophagitis: Secondary | ICD-10-CM

## 2023-02-01 MED ORDER — ESOMEPRAZOLE MAGNESIUM 40 MG PO CPDR: 40.0000 mg | DELAYED_RELEASE_CAPSULE | Freq: Every day | ORAL | 0 refills | Status: DC

## 2023-02-01 MED ORDER — DICYCLOMINE HCL 10 MG PO CAPS: 10.0000 mg | ORAL_CAPSULE | ORAL | 0 refills | Status: DC | PRN

## 2023-02-01 NOTE — Patient Instructions (Addendum)
Milk of magnesium- 1 dose daily   We have sent the following medications to your pharmacy for you to pick up at your convenience: Esomeprazole-40 mg 1 tablet daily  Dicyclomine- 10mg  as needed. ( No driving while taking)    We have you scheduled for a follow up with Dr Leone Payor on 04/17/2023 @ 9:30am  _______________________________________________________  If your blood pressure at your visit was 140/90 or greater, please contact your primary care physician to follow up on this.  _______________________________________________________  If you are age 83 or older, your body mass index should be between 23-30. Your Body mass index is 31.18 kg/m. If this is out of the aforementioned range listed, please consider follow up with your Primary Care Provider.  If you are age 49 or younger, your body mass index should be between 19-25. Your Body mass index is 31.18 kg/m. If this is out of the aformentioned range listed, please consider follow up with your Primary Care Provider.   ________________________________________________________  The [facility] providers would like to encourage you to use [facility] to communicate with providers for non-urgent requests or questions.  Due to long hold times on the telephone, sending your provider a message by [facility] may be a faster and more efficient way to get a response.  Please allow 48 business hours for a response.  Please remember that this is for non-urgent requests.  _______________________________________________________ It was a pleasure to see you today!  Thank you for trusting me with your gastrointestinal care!

## 2023-02-01 NOTE — Progress Notes (Signed)
Chief Complaint: Primary GI Doctor: Dr. Leone Payor  HPI:  82 yo [place] woman with a history of hyperplastic and fundic gland polyps, gastroparesis, diverticulitis, IBS, and a remote history of a benign carcinoid tumor of the stomach who presents with complaints of left lower quadrant pain that radiates down into the groin and in the back. She actually also has chronic abdominal pain issues.     Last colonoscopy 2012, severe diverticulosis throughout. Not repeated due to age and lack of polyps.   Interval history:   Patient reports for one month she has had LLQ abdominal pain that radiates into her left side and leg. She states it feels like someone is pulling her insides apart. She also has been having some issues with constipation. If she does go to the restroom she feels better. She has Miralax but reports she is not taking it daily because it causes abdominal pain and cramping if she takes it too often. Patient also reports a lot of gas and bloat.  Patient reports her PCP prescribed hyoscyamine 0.125 mg for the abdominal pain which patient reports it helps some, but feels the dicyclomine helps more and is cheaper. She would like to go back on the medication.  At one time she experienced dizziness with the medication but states she was taking it 4 times a day.   Patient also has GERD and taking OTC Nexium 20 mg (2 capsules) which controls her symptoms but is very expensive. She request prescription.    Lastly patient reports she saw Dr. Leone Payor on 11/21/2022 she was experiencing chest discomfort and coughing.  Patient states that her symptoms started at the same time she was placed on letrozole therefore they stopped the medication 2 weeks ago and patient reports she no longer has a cough or chest discomfort.  The plan is to follow-up in 4 more weeks decide if they will place her back on the medication.  Past Medical History:  Diagnosis Date   Abdominal pain in female 03/18/2010   Qualifier:  Diagnosis of  By: Leone Payor MD, Alfonse Ras E    Anemia 06/08/2014   Anxiety    Arthritis    Spinal Osteoarthritis   Breast cancer (HCC)    Carcinoid tumor of stomach    Cataract    Chest pain    Myoview 12/15 no ischemia.   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 09/05/2006   Qualifier: Diagnosis of  By: Charlsie Quest RMA, Lucy     Diabetic peripheral neuropathy (HCC) 10/29/2013   Encounter for preventative adult health care exam with abnormal findings 09/14/2013   Esophageal reflux    Gastric polyp    Fundic Gland   Gastroparesis    Headache(784.0)    Heart murmur    Echocardiogram 2/11: EF 60-65%, mild LAE, grade 1 diastolic dysfunction, aortic valve sclerosis, mean gradient 9 mm of mercury, PASP 34   Hematuria 03/16/2016   Iron deficiency anemia, unspecified    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   Leg swelling    bilateral   Neck pain 04/22/2015   PONV (postoperative nausea and vomiting)    pt states body temperature drops every time she has anesthesia; pt states only needs small amount of anesthesia   PSVT (paroxysmal supraventricular tachycardia) (HCC)    Pure hypercholesterolemia    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   Renal insufficiency 06/18/2019   pt states  L kidney function very  low-functions at about 20%   Stroke [facility])    tia, 2014   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   Type II or unspecified type diabetes mellitus without mention of complication, not stated as uncontrolled    Unspecified essential hypertension    Unspecified hereditary and idiopathic peripheral neuropathy 10/29/2013   Vitamin D deficiency    Past Surgical History:  Procedure Laterality Date   BREAST BIOPSY Right 06/13/2022   [place] RT BREAST BX W LOC DEV 1ST LESION IMG BX SPEC US GUIDE 06/13/2022 [facility]   BREAST LUMPECTOMY Right 07/31/2022   Procedure: RIGHT BREAST LUMPECTOMY;  Surgeon: Abigail Miyamoto, MD;  Location: [facility];  Service: General;  Laterality: Right;   CHOLECYSTECTOMY  1993   COLONOSCOPY  11/11/2010   diverticulosis   DILATATION & CURRETTAGE/HYSTEROSCOPY WITH RESECTOCOPE N/A 02/25/2013   Procedure: Attempted hysteroscopy with uterine perforation;  Surgeon: Jacqualin Combes de Gwenevere Ghazi, MD;  Location: [facility];  Service: Gynecology;  Laterality: N/A;   ESOPHAGOGASTRODUODENOSCOPY  08/29/2010; 09/15/2010   Carcinoid tumor less than 1 cm in July 2012 not seen in August 2012 , gastritis, fundic gland polyps   ESOPHAGOGASTRODUODENOSCOPY  05/16/2011   ESOPHAGOGASTRODUODENOSCOPY  06/14/2012   EUS  12/15/2010   Procedure: UPPER ENDOSCOPIC ULTRASOUND (EUS) LINEAR;  Surgeon: Rob Bunting, MD;  Location: [facility];  Service: Endoscopy;  Laterality: N/A;   EYE SURGERY Bilateral    Bi lateral cateracts and bi lateral laser   LAPAROSCOPY N/A 02/25/2013   Procedure: Cystoscopy and laparoscopy with fulguration of uterine serosa;  Surgeon: Jacqualin Combes de Gwenevere Ghazi, MD;  Location: [facility];  Service: Gynecology;  Laterality: N/A;   TONSILLECTOMY      Current Outpatient Medications  Medication Sig Dispense Refill   acetaminophen (TYLENOL) 325 MG tablet Take 2 tablets (650 mg total) by mouth every 6 (six) hours as needed for up to 30 doses for mild pain or moderate pain. 30 tablet 0   amLODipine (NORVASC) 5 MG tablet TAKE 1 TABLET BY MOUTH DAILY 100 tablet 2   aspirin EC 81 MG tablet Take 81 mg by mouth daily. Swallow whole.     betamethasone valerate ointment (VALISONE) 0.1 % Apply a small amount to affected area topically every other day. 45 g 1   Blood Glucose Monitoring Suppl (ONE TOUCH ULTRA 2) w/Device KIT USE TO CHECK BLOOD SUGAR AS DIRECTED 1 kit 0   calcium-vitamin D (OSCAL WITH D) 500-5 MG-MCG tablet Take 1 tablet by mouth daily.     cholecalciferol (VITAMIN D) 1000 UNITS tablet Take 1,000 Units by mouth daily.     Esomeprazole Magnesium (NEXIUM 24HR) 20 MG TBEC Take 1 tablet by  mouth daily.     furosemide (LASIX) 20 MG tablet TAKE 1 TABLET BY MOUTH DAILY AS  NEEDED 100 tablet 2   hyoscyamine (LEVSIN SL) 0.125 MG SL tablet Place 1 tablet (0.125 mg total) under the tongue every 4 (four) hours as needed. 30 tablet 3   Lancets (ONETOUCH ULTRASOFT) lancets USE AS DIRECTED 3 TIMES  DAILY 300 each 3   letrozole (FEMARA) 2.5 MG tablet Take 1 tablet (2.5 mg total) by mouth daily. 90 tablet 3   losartan (COZAAR) 50 MG tablet TAKE 1 TABLET BY MOUTH TWICE  DAILY 200 tablet 2   metFORMIN (GLUCOPHAGE-XR) 500 MG 24 hr tablet TAKE 1 TABLET BY MOUTH IN THE  MORNING AND 1 TABLET BY MOUTH AT BEDTIME 200 tablet 2   metoCLOPramide (REGLAN) 5 MG  tablet TAKE 1 TABLET BY MOUTH TWICE  DAILY AS NEEDED 180 tablet 1   metoprolol tartrate (LOPRESSOR) 50 MG tablet TAKE 1 TABLET BY MOUTH TWICE  DAILY 200 tablet 3   Multiple Vitamin (MULTIVITAMIN ADULT PO) Take by mouth.     ondansetron (ZOFRAN) 4 MG tablet Take 1 tablet (4 mg total) by mouth every 6 (six) hours as needed. 30 tablet 5   ONETOUCH ULTRA test strip CHECK BLOOD SUGAR 3 TIMES DAILY  OR AS NEEDED 300 strip 2   potassium chloride (KLOR-CON M) 10 MEQ tablet TAKE 1 TABLET BY MOUTH DAILY 100 tablet 2   rosuvastatin (CRESTOR) 20 MG tablet TAKE 1 TABLET BY MOUTH DAILY 100 tablet 2   thiamine (VITAMIN B-1) 100 MG tablet Take 100 mg by mouth every other day.     vitamin E 180 MG (400 UNITS) capsule Take 400 Units by mouth daily.     No current facility-administered medications for this visit.    Allergies as of 02/01/2023 - Review Complete 02/01/2023  Allergen Reaction Noted   Tramadol Other (See Comments) 01/22/2012    Family History  Problem Relation Age of Onset   Diabetes Mother    Stroke Father        deceased age 86   Heart disease Sister        deceased MI age 4   Diabetes Sister    Heart disease Sister    Hypertension Sister    Hyperlipidemia Sister    Diabetes Sister    Heart disease Sister    Hypertension Sister     Hyperlipidemia Sister    Heart disease Brother        deceased MI age 82   Intellectual disability Brother    Diabetes Brother    Heart disease Brother    Hypertension Brother    Hyperlipidemia Brother    Diabetes Maternal Grandmother    Hypertension Paternal Grandmother    Colon cancer Neg Hx    Esophageal cancer Neg Hx    Stomach cancer Neg Hx    Rectal cancer Neg Hx    Review of Systems:    Constitutional: No weight loss, fever, chills, weakness or fatigue HEENT: Eyes: No change in vision               Ears, Nose, Throat:  No change in hearing or congestion Skin: No rash or itching Cardiovascular: No chest pain, chest pressure or palpitations   Respiratory: No SOB or cough Gastrointestinal: See HPI and otherwise negative Genitourinary: No dysuria or change in urinary frequency Neurological: No headache, dizziness or syncope Musculoskeletal: No new muscle or joint pain Hematologic: No bleeding or bruising Psychiatric: No history of depression or anxiety    Physical Exam:  Vital signs: BP 122/68   Pulse 64   Ht 5\' 1"  (1.549 m)   Wt 165 lb (74.8 kg)   LMP 02/07/1992   SpO2 97%   BMI 31.18 kg/m   Constitutional:   Pleasant female appears to be in NAD, Well developed, Well nourished, alert and cooperative Throat: Oral cavity and pharynx without inflammation, swelling or lesion.  Respiratory: Respirations even and unlabored. Lungs clear to auscultation bilaterally.   No wheezes, crackles, or rhonchi.  Cardiovascular: Normal S1, S2. No MRG. Regular rate and rhythm. No peripheral edema, cyanosis or pallor.  Gastrointestinal:  Soft, nondistended, LLQ abdominal tenderness with palpation that radiates to side. No rebound or guarding. Normal bowel sounds. No appreciable masses or hepatomegaly. Rectal:  Not  performed.  Skin:   Dry and intact without significant lesions or rashes. Psychiatric: Oriented to person, place and time. Demonstrates good judgement and reason without  abnormal affect or behaviors.  RELEVANT LABS AND IMAGING: CBC    Component Value Date/Time   WBC 9.2 01/22/2023 0927   WBC 9.4 01/12/2023 0935   RBC 4.22 01/22/2023 0927   HGB 12.0 01/22/2023 0927   HGB 13.0 11/13/2016 1330   HCT 35.9 (L) 01/22/2023 0927   HCT 37.8 11/13/2016 1330   PLT 258 01/22/2023 0927   PLT 215 11/13/2016 1330   MCV 85.1 01/22/2023 0927   MCV 85 11/13/2016 1330   MCH 28.4 01/22/2023 0927   MCHC 33.4 01/22/2023 0927   RDW 12.2 01/22/2023 0927   RDW 12.5 11/13/2016 1330   LYMPHSABS 2.6 01/22/2023 0927   LYMPHSABS 2.1 11/13/2016 1330   MONOABS 0.8 01/22/2023 0927   EOSABS 0.1 01/22/2023 0927   EOSABS 0.1 11/13/2016 1330   BASOSABS 0.1 01/22/2023 0927   BASOSABS 0.0 11/13/2016 1330    CMP     Component Value Date/Time   NA 132 (L) 01/22/2023 0927   NA 136 05/08/2016 1305   NA 134 (L) 07/08/2015 1149   K 4.4 01/22/2023 0927   K 4.2 05/08/2016 1305   K 4.4 07/08/2015 1149   CL 98 01/22/2023 0927   CL 102 05/08/2016 1305   CO2 27 01/22/2023 0927   CO2 26 05/08/2016 1305   CO2 25 07/08/2015 1149   GLUCOSE 182 (H) 01/22/2023 0927   GLUCOSE 143 (H) 05/08/2016 1305   BUN 22 01/22/2023 0927   BUN 16 05/08/2016 1305   BUN 15.5 07/08/2015 1149   CREATININE 0.91 01/22/2023 0927   CREATININE 0.91 08/05/2021 1415   CREATININE 0.8 07/08/2015 1149   CALCIUM 10.1 01/22/2023 0927   CALCIUM 9.4 05/08/2016 1305   CALCIUM 10.0 07/08/2015 1149   PROT 7.3 01/22/2023 0927   PROT 7.2 05/08/2016 1305   PROT 7.4 07/08/2015 1149   ALBUMIN 4.4 01/22/2023 0927   ALBUMIN 3.7 05/08/2016 1305   ALBUMIN 3.9 07/08/2015 1149   AST 20 01/22/2023 0927   AST 24 07/08/2015 1149   ALT 20 01/22/2023 0927   ALT 29 05/08/2016 1305   ALT 30 07/08/2015 1149   ALKPHOS 58 01/22/2023 0927   ALKPHOS 56 05/08/2016 1305   ALKPHOS 62 07/08/2015 1149   BILITOT 0.6 01/22/2023 0927   BILITOT 0.64 07/08/2015 1149   GFRNONAA >60 01/22/2023 0927   GFRNONAA 85 05/04/2014 0829   GFRAA  >60 12/26/2017 1000   GFRAA >89 05/04/2014 0829   CT 02/18/22 IMPRESSION: No acute findings. Diffuse colonic diverticulosis, without radiographic evidence of diverticulitis.  Last colonoscopy 2012, severe diverticulosis throughout. Not repeated due to age and lack of polyps.   01/11/2017 upper endoscopy Impression:  - Multiple gastric polyps. Consistent with known fundic gland polyps. Nothing suspicious seen today.  - The examination was otherwise normal.  - No specimens collected.  Assessment: Encounter Diagnoses  Name Primary?   Chronic constipation Yes   Left lower quadrant abdominal tenderness without rebound tenderness    History of diverticulitis    Gastroesophageal reflux disease, unspecified whether esophagitis present   82 year old female patient that presents with with worsening constipation and lower abdominal pain.  Discussed trying a another over-the-counter laxative such as milk of magnesium to see if she can tolerate better.  She would also like to try the dicyclomine as it did help and the past for abdominal spasms.  Discussed trying it as needed versus scheduled which seem to cause dizziness long-term.  Patient has already had 1 CT scan this year with contrast and we discussed trying to avoid them given her history of cancer. Reflux is controlled with Nexium 40 mg p.o. daily, patient would like prescription.   Plan: - Switch to Milk of Magnesium to see if less cramping -Restart dicyclomine 10 mg prn ([facility] RX), if dizziness reoccurs we discussed her stopping. 1 mth supply. No driving on medication. -Esomeprazole 40 mg po daily 90 days -ER precautions given. -Follow-up with Dr. Leone Payor 3-4 mths  Myya Meenach, FNP-C [facility] 02/01/2023, 1:49 PM  Cc: [name], MD

## 2023-03-06 ENCOUNTER — Inpatient Hospital Stay: Payer: Medicare Other | Admitting: Hematology & Oncology

## 2023-03-06 ENCOUNTER — Encounter: Payer: Self-pay | Admitting: *Deleted

## 2023-03-06 ENCOUNTER — Other Ambulatory Visit: Payer: Self-pay

## 2023-03-06 ENCOUNTER — Inpatient Hospital Stay: Payer: Medicare Other | Attending: Hematology & Oncology

## 2023-03-06 ENCOUNTER — Encounter: Payer: Self-pay | Admitting: Family

## 2023-03-06 VITALS — BP 131/47 | HR 59 | Temp 98.2°F | Wt 163.8 lb

## 2023-03-06 DIAGNOSIS — Z7982 Long term (current) use of aspirin: Secondary | ICD-10-CM | POA: Diagnosis not present

## 2023-03-06 DIAGNOSIS — C50411 Malignant neoplasm of upper-outer quadrant of right female breast: Secondary | ICD-10-CM

## 2023-03-06 DIAGNOSIS — Z1732 Human epidermal growth factor receptor 2 negative status: Secondary | ICD-10-CM | POA: Insufficient documentation

## 2023-03-06 DIAGNOSIS — Z170 Estrogen receptor positive status [ER+]: Secondary | ICD-10-CM | POA: Diagnosis not present

## 2023-03-06 DIAGNOSIS — Z79811 Long term (current) use of aromatase inhibitors: Secondary | ICD-10-CM | POA: Insufficient documentation

## 2023-03-06 DIAGNOSIS — Z79899 Other long term (current) drug therapy: Secondary | ICD-10-CM | POA: Insufficient documentation

## 2023-03-06 DIAGNOSIS — D509 Iron deficiency anemia, unspecified: Secondary | ICD-10-CM | POA: Insufficient documentation

## 2023-03-06 DIAGNOSIS — Z1721 Progesterone receptor positive status: Secondary | ICD-10-CM | POA: Diagnosis not present

## 2023-03-06 LAB — CBC WITH DIFFERENTIAL (CANCER CENTER ONLY)
Abs Immature Granulocytes: 0.03 10*3/uL (ref 0.00–0.07)
Basophils Absolute: 0.1 10*3/uL (ref 0.0–0.1)
Basophils Relative: 1 %
Eosinophils Absolute: 0.1 10*3/uL (ref 0.0–0.5)
Eosinophils Relative: 1 %
HCT: 36.8 % (ref 36.0–46.0)
Hemoglobin: 12.1 g/dL (ref 12.0–15.0)
Immature Granulocytes: 0 %
Lymphocytes Relative: 27 %
Lymphs Abs: 2.3 10*3/uL (ref 0.7–4.0)
MCH: 28.2 pg (ref 26.0–34.0)
MCHC: 32.9 g/dL (ref 30.0–36.0)
MCV: 85.8 fL (ref 80.0–100.0)
Monocytes Absolute: 0.7 10*3/uL (ref 0.1–1.0)
Monocytes Relative: 9 %
Neutro Abs: 5.2 10*3/uL (ref 1.7–7.7)
Neutrophils Relative %: 62 %
Platelet Count: 220 10*3/uL (ref 150–400)
RBC: 4.29 MIL/uL (ref 3.87–5.11)
RDW: 12.3 % (ref 11.5–15.5)
WBC Count: 8.4 10*3/uL (ref 4.0–10.5)
nRBC: 0 % (ref 0.0–0.2)

## 2023-03-06 LAB — CMP (CANCER CENTER ONLY)
ALT: 18 U/L (ref 0–44)
AST: 19 U/L (ref 15–41)
Albumin: 4.7 g/dL (ref 3.5–5.0)
Alkaline Phosphatase: 55 U/L (ref 38–126)
Anion gap: 10 (ref 5–15)
BUN: 23 mg/dL (ref 8–23)
CO2: 26 mmol/L (ref 22–32)
Calcium: 9.7 mg/dL (ref 8.9–10.3)
Chloride: 96 mmol/L — ABNORMAL LOW (ref 98–111)
Creatinine: 0.88 mg/dL (ref 0.44–1.00)
GFR, Estimated: >60 mL/min (ref >60)
Glucose, Bld: 159 mg/dL — ABNORMAL HIGH (ref 70–99)
Potassium: 4.4 mmol/L (ref 3.5–5.1)
Sodium: 132 mmol/L — ABNORMAL LOW (ref 135–145)
Total Bilirubin: 0.5 mg/dL (ref 0.0–1.2)
Total Protein: 7 g/dL (ref 6.5–8.1)

## 2023-03-06 LAB — FERRITIN: Ferritin: 203 ng/mL (ref 11–307)

## 2023-03-06 LAB — IRON AND IRON BINDING CAPACITY (CC-WL,HP ONLY)
Iron: 79 ug/dL (ref 28–170)
Saturation Ratios: 21 % (ref 10.4–31.8)
TIBC: 382 ug/dL (ref 250–450)
UIBC: 303 ug/dL (ref 148–442)

## 2023-03-06 NOTE — Progress Notes (Signed)
 Hematology and Oncology Follow Up Visit  [name] [id number] 1941/01/26 83 y.o. 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 [name] is back for follow-up.   ECOG Performance Status: 1 - Symptomatic but completely ambulatory  Medications:  Allergies as of 03/06/2023       Reactions   Tramadol  Other (See Comments)   Dizziness        Medication List        Accurate as of March 06, 2023 11:53 AM. If you have any questions, ask your nurse or doctor.          acetaminophen  325 MG tablet Commonly known as: Tylenol  Take 2 tablets (650 mg total) by mouth every 6 (six) hours as needed for up to 30 doses for mild pain or moderate pain.   amLODipine  5 MG tablet Commonly known as: NORVASC  TAKE 1 TABLET BY MOUTH DAILY   aspirin  EC 81 MG tablet Take 81 mg by mouth daily. Swallow whole.   betamethasone  valerate ointment 0.1 % Commonly known as: VALISONE  Apply a small amount to affected area topically every other day.   calcium -vitamin D  500-5 MG-MCG tablet Commonly known as: OSCAL WITH D Take 1 tablet by mouth daily.   cholecalciferol  1000 units tablet Commonly known as: VITAMIN D  Take 1,000 Units by mouth daily.   dicyclomine  10 MG capsule Commonly known as: BENTYL  Take 1 capsule (10 mg total) by mouth as needed for spasms.   esomeprazole  40 MG capsule Commonly known as: NexIUM  Take 1 capsule (40 mg total) by mouth daily at 12 noon.   furosemide  20 MG tablet Commonly known as: LASIX  TAKE 1 TABLET BY MOUTH DAILY AS  NEEDED   letrozole  2.5 MG tablet Commonly known as: FEMARA  Take 1 tablet (2.5 mg total) by mouth daily.   losartan  50 MG  tablet Commonly known as: COZAAR  TAKE 1 TABLET BY MOUTH TWICE  DAILY   metFORMIN  500 MG 24 hr tablet Commonly known as: GLUCOPHAGE -XR TAKE 1 TABLET BY MOUTH IN THE  MORNING AND 1 TABLET BY MOUTH AT BEDTIME   metoCLOPramide  5 MG tablet Commonly known as: REGLAN  TAKE 1 TABLET BY MOUTH TWICE  DAILY AS NEEDED   metoprolol  tartrate 50 MG tablet Commonly known as: LOPRESSOR  TAKE 1 TABLET BY MOUTH TWICE  DAILY   MULTIVITAMIN ADULT PO Take by mouth.   ondansetron  4 MG tablet Commonly known as: ZOFRAN  Take 1 tablet (4 mg total) by mouth every 6 (six) hours as needed.   ONE TOUCH ULTRA 2 w/Device Kit USE TO CHECK BLOOD SUGAR AS DIRECTED   OneTouch Ultra test strip Generic drug: glucose blood CHECK BLOOD SUGAR 3 TIMES DAILY  OR AS NEEDED   onetouch ultrasoft lancets USE AS DIRECTED 3 TIMES  DAILY   Oscimin  0.125 MG SL tablet Generic drug: hyoscyamine  Place 1 tablet (0.125 mg total) under the tongue every 4 (four) hours as needed.   potassium chloride  10 MEQ tablet Commonly known as: KLOR-CON  M TAKE 1 TABLET BY MOUTH DAILY   rosuvastatin  20 MG tablet Commonly known as: CRESTOR  TAKE 1 TABLET BY MOUTH DAILY   thiamine  100 MG tablet  Commonly known as: Vitamin B-1 Take 100 mg by mouth every other day.   vitamin E 180 MG (400 UNITS) capsule Take 400 Units by mouth daily.        Allergies:  Allergies  Allergen Reactions   Tramadol  Other (See Comments)    Dizziness     Past Medical History, Surgical history, Social history, and Family History were reviewed and updated.  Review of Systems: All other 10 point review of systems is negative.   Physical Exam:  weight is 163 lb 12.8 oz (74.3 kg). Her temperature is 98.2 F (36.8 C). Her blood pressure is 131/47 (abnormal) and her pulse is 59 (abnormal). Her oxygen saturation is 100%.   Wt Readings from Last 3 Encounters:  03/06/23 163 lb 12.8 oz (74.3 kg)  02/01/23 165 lb (74.8 kg)  01/22/23 163 lb (73.9 kg)     Ocular: Sclerae unicteric, pupils equal, round and reactive to light Ear-nose-throat: Oropharynx clear, dentition fair Lymphatic: No cervical or supraclavicular adenopathy Lungs no rales or rhonchi, good excursion bilaterally Heart regular rate and rhythm, no murmur appreciated Abd soft, nontender, positive bowel sounds MSK no focal spinal tenderness, no joint edema Neuro: non-focal, well-oriented, appropriate affect Breasts: Deferred   Lab Results  Component Value Date   WBC 8.4 03/06/2023   HGB 12.1 03/06/2023   HCT 36.8 03/06/2023   MCV 85.8 03/06/2023   PLT 220 03/06/2023   Lab Results  Component Value Date   FERRITIN 198 01/22/2023   IRON  69 01/22/2023   TIBC 365 01/22/2023   UIBC 296 01/22/2023   IRONPCTSAT 19 01/22/2023   Lab Results  Component Value Date   RETICCTPCT 1.3 08/29/2022   RBC 4.29 03/06/2023   RETICCTABS 39.8 01/22/2015   No results found for: JONATHAN BONG [facility] Lab Results  Component Value Date   IGA 228 06/06/2011   No results found for: STEPHANY CARLOTA BENSON MARKEL EARLA JOANNIE DOC, MSPIKE, SPEI   Chemistry      Component Value Date/Time   NA 132 (L) 01/22/2023 0927   NA 136 05/08/2016 1305   NA 134 (L) 07/08/2015 1149   K 4.4 01/22/2023 0927   K 4.2 05/08/2016 1305   K 4.4 07/08/2015 1149   CL 98 01/22/2023 0927   CL 102 05/08/2016 1305   CO2 27 01/22/2023 0927   CO2 26 05/08/2016 1305   CO2 25 07/08/2015 1149   BUN 22 01/22/2023 0927   BUN 16 05/08/2016 1305   BUN 15.5 07/08/2015 1149   CREATININE 0.91 01/22/2023 0927   CREATININE 0.91 08/05/2021 1415   CREATININE 0.8 07/08/2015 1149      Component Value Date/Time   CALCIUM  10.1 01/22/2023 0927   CALCIUM  9.4 05/08/2016 1305   CALCIUM  10.0 07/08/2015 1149   ALKPHOS 58 01/22/2023 0927   ALKPHOS 56 05/08/2016 1305   ALKPHOS 62 07/08/2015 1149   AST 20 01/22/2023 0927   AST 24 07/08/2015 1149   ALT 20 01/22/2023 0927    ALT 29 05/08/2016 1305   ALT 30 07/08/2015 1149   BILITOT 0.6 01/22/2023 0927   BILITOT 0.64 07/08/2015 1149       Impression and Plan: Ms. [name] is a pleasant 83 yo [place] female with history of IDA and now stage I breast cancer.    Lauraine Pepper, NP 1/28/202511:53 AM

## 2023-03-06 NOTE — Progress Notes (Signed)
 Patient declined being seen by Vicente Masson. MD agreed to see patient and she did not want to wait, patient left without being seen by provider.

## 2023-03-07 ENCOUNTER — Other Ambulatory Visit: Payer: Self-pay | Admitting: Cardiology

## 2023-03-07 ENCOUNTER — Telehealth: Payer: Self-pay

## 2023-03-07 ENCOUNTER — Other Ambulatory Visit: Payer: Self-pay | Admitting: Family Medicine

## 2023-03-07 ENCOUNTER — Other Ambulatory Visit: Payer: Self-pay | Admitting: Gastroenterology

## 2023-03-07 NOTE — Telephone Encounter (Signed)
-----   Message from [name] sent at 03/06/2023  7:03 PM EST ----- Please call - the iron level is ok!!  I again apologize for not seeing her today!!  She is supposed to let me know what day she can come.   [name]

## 2023-03-07 NOTE — Telephone Encounter (Signed)
Advised via MyChart.

## 2023-03-09 ENCOUNTER — Encounter: Payer: Self-pay | Admitting: Hematology & Oncology

## 2023-03-09 ENCOUNTER — Inpatient Hospital Stay: Payer: Medicare Other | Admitting: Hematology & Oncology

## 2023-03-09 VITALS — BP 127/46 | HR 64 | Temp 99.4°F | Ht 61.0 in | Wt 163.0 lb

## 2023-03-09 DIAGNOSIS — K909 Intestinal malabsorption, unspecified: Secondary | ICD-10-CM

## 2023-03-09 DIAGNOSIS — Z1732 Human epidermal growth factor receptor 2 negative status: Secondary | ICD-10-CM | POA: Diagnosis not present

## 2023-03-09 DIAGNOSIS — D509 Iron deficiency anemia, unspecified: Secondary | ICD-10-CM | POA: Diagnosis not present

## 2023-03-09 DIAGNOSIS — Z7982 Long term (current) use of aspirin: Secondary | ICD-10-CM | POA: Diagnosis not present

## 2023-03-09 DIAGNOSIS — Z79899 Other long term (current) drug therapy: Secondary | ICD-10-CM | POA: Diagnosis not present

## 2023-03-09 DIAGNOSIS — Z79811 Long term (current) use of aromatase inhibitors: Secondary | ICD-10-CM | POA: Diagnosis not present

## 2023-03-09 DIAGNOSIS — Z170 Estrogen receptor positive status [ER+]: Secondary | ICD-10-CM | POA: Diagnosis not present

## 2023-03-09 DIAGNOSIS — Z1721 Progesterone receptor positive status: Secondary | ICD-10-CM | POA: Diagnosis not present

## 2023-03-09 DIAGNOSIS — C50411 Malignant neoplasm of upper-outer quadrant of right female breast: Secondary | ICD-10-CM | POA: Diagnosis not present

## 2023-03-09 MED ORDER — ANASTROZOLE 1 MG PO TABS: 1.0000 mg | ORAL_TABLET | Freq: Every day | ORAL | 6 refills | Status: DC

## 2023-03-09 NOTE — Progress Notes (Signed)
Hematology and Oncology Follow Up Visit  [name] [id number] 1940/11/30 83 y.o. 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 daily Feraheme --  last given 04/19/2020     Interim History:  Ms. Aderhold is back for follow-up.  She just cannot tolerate the Femara.  As such, we are going to switch her over to Arimidex.  We will have to see how she does with the Arimidex.  Otherwise, I think she is doing okay.  She has had no complaints outside of some abdominal pain.  There is seems to be more so on the left side of the abdomen.  I suspect that she might have some diverticulosis.  She has had no nausea or vomiting.  She has had no diarrhea.  She has had no constipation.  There is been no rashes.  She has had no cough or shortness of breath.  Thankfully, she has had no issues with COVID.  Her last iron studies that were done this week showed a ferritin of 203 with an iron saturation of 21%.  Overall, I would say that her performance status is probably ECOG 1.   Medications:  Current Outpatient Medications:    acetaminophen (TYLENOL) 325 MG tablet, Take 2 tablets (650 mg total) by mouth every 6 (six) hours as needed for up to 30 doses for mild pain or moderate pain., Disp: 30 tablet, Rfl: 0   amLODipine (NORVASC) 5 MG tablet, TAKE 1 TABLET BY MOUTH DAILY, Disp: 100 tablet, Rfl: 0   aspirin EC 81 MG tablet, Take 81 mg by mouth daily. Swallow whole., Disp: , Rfl:    betamethasone valerate ointment (VALISONE) 0.1 %, Apply a small amount to affected area topically every other day., Disp: 45 g, Rfl: 1   Blood Glucose Monitoring Suppl (ONE TOUCH ULTRA 2) w/Device KIT, USE TO CHECK BLOOD SUGAR AS DIRECTED, Disp: 1  kit, Rfl: 0   calcium-vitamin D (OSCAL WITH D) 500-5 MG-MCG tablet, Take 1 tablet by mouth daily., Disp: , Rfl:    cholecalciferol (VITAMIN D) 1000 UNITS tablet, Take 1,000 Units by mouth daily., Disp: , Rfl:    dicyclomine (BENTYL) 10 MG capsule, Take 1 capsule (10 mg total) by mouth as needed for spasms., Disp: 60 capsule, Rfl: 0   esomeprazole (NEXIUM) 40 MG capsule, TAKE 1 CAPSULE BY MOUTH DAILY AT 12 NOON, Disp: 100 capsule, Rfl: 3   furosemide (LASIX) 20 MG tablet, TAKE 1 TABLET BY MOUTH DAILY AS  NEEDED, Disp: 100 tablet, Rfl: 2   hyoscyamine (LEVSIN SL) 0.125 MG SL tablet, Place 1 tablet (0.125 mg total) under the tongue every 4 (four) hours as needed., Disp: 30 tablet, Rfl: 3   Lancets (ONETOUCH ULTRASOFT) lancets, USE AS DIRECTED 3 TIMES  DAILY, Disp: 300 each, Rfl: 3   letrozole (FEMARA) 2.5 MG tablet, Take 1 tablet (2.5 mg total) by mouth daily., Disp: 90 tablet, Rfl: 3   losartan (COZAAR) 50 MG tablet, TAKE 1 TABLET BY MOUTH TWICE  DAILY, Disp: 200 tablet, Rfl: 2   metFORMIN (GLUCOPHAGE-XR) 500 MG 24 hr tablet, TAKE 1 TABLET BY MOUTH IN THE  MORNING AND 1 TABLET BY MOUTH AT BEDTIME, Disp: 200 tablet, Rfl: 2   metoCLOPramide (REGLAN)  5 MG tablet, TAKE 1 TABLET BY MOUTH TWICE  DAILY AS NEEDED, Disp: 180 tablet, Rfl: 1   metoprolol tartrate (LOPRESSOR) 50 MG tablet, TAKE 1 TABLET BY MOUTH TWICE  DAILY, Disp: 200 tablet, Rfl: 3   Multiple Vitamin (MULTIVITAMIN ADULT PO), Take by mouth., Disp: , Rfl:    ondansetron (ZOFRAN) 4 MG tablet, Take 1 tablet (4 mg total) by mouth every 6 (six) hours as needed., Disp: 30 tablet, Rfl: 5   ONETOUCH ULTRA test strip, CHECK BLOOD SUGAR 3 TIMES DAILY  OR AS NEEDED, Disp: 300 strip, Rfl: 2   potassium chloride (KLOR-CON M) 10 MEQ tablet, TAKE 1 TABLET BY MOUTH DAILY, Disp: 100 tablet, Rfl: 2   rosuvastatin (CRESTOR) 20 MG tablet, Take 1 tablet (20 mg total) by mouth daily., Disp: 90 tablet, Rfl: 1   thiamine (VITAMIN B-1) 100 MG tablet, Take 100 mg by  mouth every other day., Disp: , Rfl:    vitamin E 180 MG (400 UNITS) capsule, Take 400 Units by mouth daily., Disp: , Rfl:   Allergies:  Allergies  Allergen Reactions   Tramadol Other (See Comments)    Dizziness     Past Medical History, Surgical history, Social history, and Family History were reviewed and updated.  Review of Systems: Review of Systems  Constitutional: Negative.   HENT:  Negative.    Eyes: Negative.   Respiratory: Negative.    Cardiovascular: Negative.   Gastrointestinal: Negative.   Endocrine: Negative.   Genitourinary: Negative.    Musculoskeletal: Negative.   Skin: Negative.   Neurological: Negative.   Hematological: Negative.   Psychiatric/Behavioral: Negative.      Physical Exam:  height is 5\' 1"  (1.549 m) and weight is 163 lb (73.9 kg). Her oral temperature is 99.4 F (37.4 C). Her blood pressure is 127/46 (abnormal) and her pulse is 64. Her oxygen saturation is 100%.   Wt Readings from Last 3 Encounters:  03/09/23 163 lb (73.9 kg)  03/06/23 163 lb 12.8 oz (74.3 kg)  02/01/23 165 lb (74.8 kg)    Physical Exam Vitals reviewed.  Constitutional:      Comments: Left breast with no masses, edema or erythema.  There is no left axillary adenopathy.  Right breast shows the lumpectomy incision  at about the 9 o'clock position.   There is no distinct mass.  There is no palpable right axillary adenopathy.  HENT:     Head: Normocephalic and atraumatic.  Eyes:     Pupils: Pupils are equal, round, and reactive to light.  Cardiovascular:     Rate and Rhythm: Normal rate and regular rhythm.     Heart sounds: Normal heart sounds.  Pulmonary:     Effort: Pulmonary effort is normal.     Breath sounds: Normal breath sounds.  Abdominal:     General: Bowel sounds are normal.     Palpations: Abdomen is soft.  Musculoskeletal:        General: No tenderness or deformity. Normal range of motion.     Cervical back: Normal range of motion.  Lymphadenopathy:      Cervical: No cervical adenopathy.  Skin:    General: Skin is warm and dry.     Findings: No erythema or rash.  Neurological:     Mental Status: She is alert and oriented to person, place, and time.  Psychiatric:        Behavior: Behavior normal.        Thought Content: Thought content normal.  Judgment: Judgment normal.     Lab Results  Component Value Date   WBC 8.4 03/06/2023   HGB 12.1 03/06/2023   HCT 36.8 03/06/2023   MCV 85.8 03/06/2023   PLT 220 03/06/2023     Chemistry      Component Value Date/Time   NA 132 (L) 03/06/2023 1132   NA 136 05/08/2016 1305   NA 134 (L) 07/08/2015 1149   K 4.4 03/06/2023 1132   K 4.2 05/08/2016 1305   K 4.4 07/08/2015 1149   CL 96 (L) 03/06/2023 1132   CL 102 05/08/2016 1305   CO2 26 03/06/2023 1132   CO2 26 05/08/2016 1305   CO2 25 07/08/2015 1149   BUN 23 03/06/2023 1132   BUN 16 05/08/2016 1305   BUN 15.5 07/08/2015 1149   CREATININE 0.88 03/06/2023 1132   CREATININE 0.91 08/05/2021 1415   CREATININE 0.8 07/08/2015 1149      Component Value Date/Time   CALCIUM 9.7 03/06/2023 1132   CALCIUM 9.4 05/08/2016 1305   CALCIUM 10.0 07/08/2015 1149   ALKPHOS 55 03/06/2023 1132   ALKPHOS 56 05/08/2016 1305   ALKPHOS 62 07/08/2015 1149   AST 19 03/06/2023 1132   AST 24 07/08/2015 1149   ALT 18 03/06/2023 1132   ALT 29 05/08/2016 1305   ALT 30 07/08/2015 1149   BILITOT 0.5 03/06/2023 1132   BILITOT 0.64 07/08/2015 1149      Impression and Plan: Ms. Berkel is a very charming 83 year old [place] female.  She now has history of stage I infiltrating ductal carcinoma of the right breast.  She had a low Oncotype score.  She did not require radiotherapy.  We will see if she does okay with the Arimidex.  Hopefully, this will not cause her problems.  I would like to see her back in about 6 weeks or so.  It will take about a week or so before she gets the Arimidex.  I told her that exercise will certainly help decrease the  risk of breast cancer coming back.  I know that she is proactive.  I know she is doing quite a bit to try to help herself.     Josph Macho, MD 1/31/20253:40 PM

## 2023-03-12 ENCOUNTER — Telehealth: Payer: Self-pay | Admitting: Gastroenterology

## 2023-03-12 DIAGNOSIS — R10824 Left lower quadrant rebound abdominal tenderness: Secondary | ICD-10-CM

## 2023-03-12 DIAGNOSIS — R1032 Left lower quadrant pain: Secondary | ICD-10-CM

## 2023-03-12 NOTE — Telephone Encounter (Signed)
Inbound call from patient, would like to discuss abdominal pain she has been having with a nurse.

## 2023-03-12 NOTE — Telephone Encounter (Signed)
Patient is under Dr. Leone Payor, due to [facility] not having a nurse, being sent to his nurse.

## 2023-03-13 ENCOUNTER — Encounter: Payer: Self-pay | Admitting: Family

## 2023-03-13 ENCOUNTER — Other Ambulatory Visit: Payer: Self-pay

## 2023-03-13 DIAGNOSIS — R10824 Left lower quadrant rebound abdominal tenderness: Secondary | ICD-10-CM

## 2023-03-13 DIAGNOSIS — R1032 Left lower quadrant pain: Secondary | ICD-10-CM

## 2023-03-13 NOTE — Telephone Encounter (Signed)
 I called her - she is about the same despite following treatment recommendations  Please order a CT abd/pelvis with IV contrast for her -  R/o diverticulitis  Do [facility]  Lab Results  Component Value Date   NA 132 (L) 03/06/2023   CL 96 (L) 03/06/2023   K 4.4 03/06/2023   CO2 26 03/06/2023   BUN 23 03/06/2023   CREATININE 0.88 03/06/2023   GFRNONAA >60 03/06/2023   CALCIUM  9.7 03/06/2023   PHOS 3.5 03/05/2014   ALBUMIN 4.7 03/06/2023   GLUCOSE 159 (H) 03/06/2023    Encounter Diagnoses  Name Primary?   LLQ pain Yes   Left lower quadrant abdominal tenderness with rebound tenderness

## 2023-03-13 NOTE — Telephone Encounter (Signed)
 Pt stated that she is still having ongoing pain in her left side along with skinny BM. Pt was recently seen by [facility] NP on 02/01/2024. Chart reviewed and plan reviewed with pt. Plan: - Switch to Milk of Magnesium  to see if less cramping -Restart dicyclomine  10 mg prn ([facility] RX), if dizziness reoccurs we discussed her stopping. 1 mth supply. No driving on medication. -Esomeprazole  40 mg po daily 90 days -ER precautions given.  Pt currently scheduled to see Dr. Avram on 04/17/2023. Pt very adamant about Dr. Avram being notified of her condition now and request advice from him in the meantime of the office visit. Pt was notified that she recently seen Deanna May NP and that I could send the message to her. Pt requested that Dr. Avram be made aware.  Please review and advise

## 2023-03-13 NOTE — Progress Notes (Signed)
Patient was scheduled to see NP on 1/28 however she refused and was scheduled to return on 1/31 when she could see Dr Myna Hidalgo.   Patient is intolerant of femara. Will switch to arimidex and come back in 6 weeks for tolerance check.   Oncology Nurse Navigator Documentation     03/09/2023    3:00 PM  Oncology Nurse Navigator Flowsheets  Navigator Follow Up Date: 04/19/2023  Navigator Follow Up Reason: Follow-up Appointment  Navigator Location [facility]  Navigator Encounter Type Follow-up Appt  Patient Visit Type MedOnc  Treatment Phase Active Tx  Barriers/Navigation Needs No Barriers At This Time  Interventions None Required  Acuity Level 1-No Barriers  Support Groups/Services Friends and Family  Time Spent with Patient 15

## 2023-03-13 NOTE — Telephone Encounter (Signed)
 Pt made aware of Dr. Avram recommendations: Pt was ordered and scheduled for the CT scan on 03/19/2023 at 3:00 PM at [facility]. Pt to arrive at 12:45 PM  to get checked in. Drink first bottle of contrast at 1:00 PM, 2nd bottle at 2:00 Pm and the CT will be done at 3:00 PM. Pt made aware. Pt verbalized understanding with all questions answered.

## 2023-03-19 ENCOUNTER — Ambulatory Visit (HOSPITAL_BASED_OUTPATIENT_CLINIC_OR_DEPARTMENT_OTHER)
Admission: RE | Admit: 2023-03-19 | Discharge: 2023-03-19 | Disposition: A | Discharge status: Home / Self Care | Payer: Medicare Other | Source: Ambulatory Visit | Attending: Internal Medicine | Admitting: Internal Medicine

## 2023-03-19 ENCOUNTER — Encounter (HOSPITAL_BASED_OUTPATIENT_CLINIC_OR_DEPARTMENT_OTHER): Payer: Self-pay

## 2023-03-19 DIAGNOSIS — R10829 Rebound abdominal tenderness, unspecified site: Secondary | ICD-10-CM | POA: Diagnosis not present

## 2023-03-19 DIAGNOSIS — R1032 Left lower quadrant pain: Secondary | ICD-10-CM | POA: Diagnosis not present

## 2023-03-19 DIAGNOSIS — R10824 Left lower quadrant rebound abdominal tenderness: Secondary | ICD-10-CM | POA: Insufficient documentation

## 2023-03-19 DIAGNOSIS — K5730 Diverticulosis of large intestine without perforation or abscess without bleeding: Secondary | ICD-10-CM | POA: Diagnosis not present

## 2023-03-19 MED ORDER — IOHEXOL 300 MG/ML  SOLN
100.0000 mL | Freq: Once | INTRAMUSCULAR | Status: AC | PRN
  Administered 2023-03-19: 100 mL via INTRAVENOUS

## 2023-03-29 ENCOUNTER — Telehealth: Payer: Self-pay | Admitting: Internal Medicine

## 2023-03-29 ENCOUNTER — Telehealth: Payer: Self-pay | Admitting: Gastroenterology

## 2023-03-29 NOTE — Telephone Encounter (Signed)
Inbound call from patient requesting a call to discuss CT results. Advised patient once Dr. Leone Payor reviews nurse will give him a call. Please advise. Thank you.

## 2023-03-29 NOTE — Telephone Encounter (Signed)
Spoke with Kennyth Arnold with [facility] & she will have CT scan read sooner.

## 2023-03-29 NOTE — Therapy (Addendum)
 OUTPATIENT PHYSICAL THERAPY NEURO EVALUATION   Patient Name: [name] MRN: [id number] DOB:08-04-1940, 83 y.o., female Today's Date: 04/03/2023   END OF SESSION:  PT End of Session - 04/03/23 1452     Visit Number 1    Date for PT Re-Evaluation 05/29/23    Authorization Type [employer] Medicare Advantage Complete    PT Start Time 1452    PT Stop Time 1544    PT Time Calculation (min) 52 min    Activity Tolerance Patient tolerated treatment well    Behavior During Therapy [facility] for tasks assessed/performed             Past Medical History:  Diagnosis Date   Abdominal pain in female 03/18/2010   Qualifier: Diagnosis of  By: Leone Payor MD, Alfonse Ras E    Anemia 06/08/2014   Anxiety    Arthritis    Spinal Osteoarthritis   Breast cancer (HCC)    Carcinoid tumor of stomach    Cataract    Chest pain    Myoview 12/15 no ischemia.   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 09/05/2006   Qualifier: Diagnosis of  By: Charlsie Quest RMA, Lucy     Diabetic peripheral neuropathy (HCC) 10/29/2013   Encounter for preventative adult health care exam with abnormal findings 09/14/2013   Esophageal reflux    Gastric polyp    Fundic Gland   Gastroparesis    Headache(784.0)    Heart murmur    Echocardiogram 2/11: EF 60-65%, mild LAE, grade 1 diastolic dysfunction, aortic valve sclerosis, mean gradient 9 mm of mercury, PASP 34   Hematuria 03/16/2016   Iron deficiency anemia, unspecified    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   Leg swelling    bilateral   Neck pain 04/22/2015   PONV (postoperative nausea and vomiting)    pt states body temperature drops every time she has anesthesia; pt states only needs small amount of anesthesia   PSVT (paroxysmal supraventricular tachycardia) (HCC)    Pure hypercholesterolemia    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   Renal insufficiency 06/18/2019   pt states  L kidney function very  low-functions at about 20%   Stroke [facility])    tia, 2014   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   Type II or unspecified type diabetes mellitus without mention of complication, not stated as uncontrolled    Unspecified essential hypertension    Unspecified hereditary and idiopathic peripheral neuropathy 10/29/2013   Vitamin D deficiency    Past Surgical History:  Procedure Laterality Date   BREAST BIOPSY Right 06/13/2022   [place] RT BREAST BX W LOC DEV 1ST LESION IMG BX SPEC US GUIDE 06/13/2022 [facility]   BREAST LUMPECTOMY Right 07/31/2022   Procedure: RIGHT BREAST LUMPECTOMY;  Surgeon: Abigail Miyamoto, MD;  Location: [facility];  Service: General;  Laterality: Right;   CHOLECYSTECTOMY  1993   COLONOSCOPY  11/11/2010   diverticulosis   DILATATION & CURRETTAGE/HYSTEROSCOPY WITH RESECTOCOPE N/A 02/25/2013   Procedure: Attempted hysteroscopy with uterine perforation;  Surgeon: Jacqualin Combes de Gwenevere Ghazi, MD;  Location: [facility];  Service: Gynecology;  Laterality: N/A;   ESOPHAGOGASTRODUODENOSCOPY  08/29/2010; 09/15/2010   Carcinoid tumor less than 1 cm in July 2012 not seen in August 2012 , gastritis, fundic gland polyps   ESOPHAGOGASTRODUODENOSCOPY  05/16/2011   ESOPHAGOGASTRODUODENOSCOPY  06/14/2012   EUS  12/15/2010   Procedure: UPPER ENDOSCOPIC ULTRASOUND (EUS)  LINEAR;  Surgeon: Rob Bunting, MD;  Location: [facility];  Service: Endoscopy;  Laterality: N/A;   EYE SURGERY Bilateral    Bi lateral cateracts and bi lateral laser   LAPAROSCOPY N/A 02/25/2013   Procedure: Cystoscopy and laparoscopy with fulguration of uterine serosa;  Surgeon: Jacqualin Combes de Gwenevere Ghazi, MD;  Location: [facility];  Service: Gynecology;  Laterality: N/A;   TONSILLECTOMY     Patient Active Problem List   Diagnosis Date Noted   Precordial chest pain 12/24/2022   Carcinoma of breast upper outer quadrant, right (HCC) 07/04/2022   SVT (supraventricular tachycardia) (HCC) 06/21/2022   Left-sided  weakness 04/02/2022   Unsteady gait 04/02/2022   Oral lesion 03/08/2021   Sun-damaged skin 03/08/2021   Thiamine deficiency 11/01/2020   Trigeminal neuralgia 08/21/2020   Pedal edema 08/21/2020   Chronic left-sided back pain 03/28/2020   Nocturia 03/28/2020   Left-sided headache 03/28/2020   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   Renal insufficiency 06/18/2019   Educated about COVID-19 virus infection 05/15/2019   Atrophic vaginitis 01/20/2019   Stenosis of carotid artery 11/12/2018   Anxiety 10/13/2018   Referred otalgia of left ear 04/23/2018   Chronic throat clearing 04/23/2018   Sinusitis 10/11/2017   Headache 07/12/2017   Dizzy spells 11/21/2016   Constipation 11/21/2016   Nonrheumatic aortic valve stenosis 05/23/2016   Aortic atherosclerosis (HCC) 05/23/2016   Hematuria 03/16/2016   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   Pain of upper abdomen 06/06/2015   Neck pain 04/22/2015   Ear pain 02/28/2015   Abnormal urine 11/21/2014   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   Anemia 06/08/2014   RLS (restless legs syndrome) 11/23/2013   Diabetic peripheral neuropathy (HCC) 10/29/2013   Left-sided thoracic back pain 10/06/2013   Encounter for preventative adult health care exam with abnormal findings 09/14/2013   Iron deficiency anemia    Status post laparoscopy 02/25/2013   Hyponatremia 01/09/2013   GERD (gastroesophageal reflux disease) 01/09/2013   Amaurosis fugax of left eye 10/16/2012   Low back pain 06/03/2012   Vitamin D deficiency 03/11/2012   Bilateral hand pain 10/20/2011   Encounter for long-term (current) use of other medications 10/20/2011   IBS (irritable bowel syndrome) 08/14/2011   TIA (transient ischemic attack) 02/10/2011   Abnormal brain CT 01/19/2011   Allergic rhinitis 10/01/2010   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   Preventative health care 07/15/2010   FUNDIC GLAND POLYPS OF STOMACH 03/18/2010   Abdominal pain in female  03/18/2010   Left hip pain 03/17/2009   SYSTOLIC MURMUR 03/02/2009   Paroxysmal supraventricular tachycardia (HCC) 01/12/2009   PLANTAR FASCIITIS 06/08/2008   CHEST PAIN 05/18/2008   Gastroparesis 12/18/2007   HYPERCHOLESTEROLEMIA 06/11/2007   Type 2 diabetes mellitus with obesity (HCC) 09/05/2006   Essential hypertension 09/05/2006    PCP: [name], MD   REFERRING PROVIDER: [name], MD   REFERRING DIAG:  M79.10 (ICD-10-CM) - Myalgia  R26.81 (ICD-10-CM) - Unsteady gait  R53.1 (ICD-10-CM) - Weakness   THERAPY DIAG:  Muscle weakness (generalized)  Other abnormalities of gait and mobility  Unsteadiness on feet  Pain in left hip  RATIONALE FOR EVALUATION AND TREATMENT: Rehabilitation  ONSET DATE: Worsening over the past year, especially since breast cancer diagnosis and May 2024  NEXT MD VISIT: 04/23/2023   SUBJECTIVE:  SUBJECTIVE STATEMENT: Pt reports her weakness and pains seems to be exacerbated by recent meds she has had to take. She notes difficulty climbing stairs and does not feel safe using a step stool to reach high things.  She feels like she has no energy.  She reports chronic belly pain, maybe from meds vs diverticulitis.  L hip pain has been an issue for several months but patient has been told there is no "bony" reason for her pain (x-rays negative).  PAIN: Are you having pain? Yes: NPRS scale: 4-5/10 currently, up to 8-9/10 at worst  Pain location: L lateral hip Pain description: dull, sharp when more intense  Aggravating factors: lying on side to sleep (worse when lying on L than R), first moving in the morning Relieving factors: walking, Tylenol   PERTINENT HISTORY:  Extensive PMH including chronic back and neck pain, OA, osteoporosis, DM, HTN,  h/o TIA, diabetic peripheral neuropathy, PVD, headaches, sleep dysfunction, anxiety, R breast cancer s/p lumpectomy 07/31/22, SVT (refer to above problem list and past medical/surgical history for full PMH)   PRECAUTIONS: Fall  RED FLAGS: None  WEIGHT BEARING RESTRICTIONS: No  FALLS:  Has patient fallen in last 6 months? No - admits to a few close calls due to dizziness/lightheadness  LIVING ENVIRONMENT: Lives with: lives alone  Lives in: House/apartment Stairs: No Has following equipment at home: Single point cane, Environmental consultant - 4 wheeled, shower chair, and Grab bars  OCCUPATION: Retired  PLOF: Independent, Needs assistance with homemaking, Leisure: read and play card games on her phone, traveling, and housekeeping/cleaning service ~1x/month    PATIENT GOALS: "To be able to walk steady and feel comfortable going up stairs so I can visit my niece and nephew. To be able to carry groceries."   OBJECTIVE: (objective measures completed at initial evaluation unless otherwise dated)  DIAGNOSTIC FINDINGS:  01/22/23 & 10/05/22 - DH Left hip (2 several months of L hip pain) IMPRESSION: Negative.  COGNITION: Overall cognitive status: Within functional limits for tasks assessed   SENSATION: Numbness and tingling in hands and feet from DM and cervical radiculopathy  POSTURE:  rounded shoulders, forward head, increased thoracic kyphosis, and flexed trunk   PALPATION: Increased TTP over L TFL, hip flexors and abductors  UPPER EXTREMITY MMT:  MMT Right eval Left eval  Shoulder flexion 4- 4-  Shoulder extension 3+ 3+  Shoulder abduction 4- 4-  Shoulder adduction    Shoulder internal rotation 4- 4-  Shoulder external rotation 3+ 3+  Middle trapezius    Lower trapezius     (Blank rows = not tested)  MUSCLE LENGTH: Hamstrings: mod tight B ITB: mod tight B  Piriformis: severe tight B Hip flexors: mild/mod tight L>R  Quads: mild/mod tight L>R  Heelcord: mod tight B  LOWER  EXTREMITY ROM:    Mildly limited primarily due to muscle tightness  LOWER EXTREMITY MMT:  (tested in sitting on eval)  MMT Right eval Left eval  Hip flexion 3+ 3+  Hip extension 3- 3-  Hip abduction 3+ 3+  Hip adduction 3+ 3+  Hip internal rotation 3- 3-  Hip external rotation 3- 3-  Knee flexion 4- 4-  Knee extension 4- 4  Ankle dorsiflexion 3+ 4-  Ankle plantarflexion    Ankle inversion    Ankle eversion    (Blank rows = not tested)  BED MOBILITY:  Sit to supine Min A Supine to sit Min A Rolling to Right SBA  TRANSFERS: Assistive device utilized: None  Sit  to stand: Complete Independence Stand to sit: Complete Independence Chair to chair: Modified independence Floor:  NT  GAIT: Distance walked: Clinic distances Assistive device utilized: None Level of assistance: Complete Independence and SBA Gait pattern: decreased stride length and trunk flexed Comments: Decreased gait speed  RAMP: Level of Assistance:  NT Assistive device utilized:    Ramp Comments:   CURB:  Level of Assistance:  NT Assistive device utilized:    Curb Comments:   STAIRS:  Level of Assistance: NT  Stair Negotiation Technique:   Number of Stairs:    Height of Stairs:   Comments:   FUNCTIONAL TESTS: (TBA next visit) 5 times sit to stand:   Timed up and go (TUG):   10 meter walk test:   Berg Balance Scale:   Dynamic Gait Index:   Functional gait assessment:   MCTSIB: Condition 1: Avg of 3 trials:   sec, Condition 2: Avg of 3 trials:   sec, Condition 3: Avg of 3 trials:   sec, Condition 4: Avg of 3 trials:   sec, and Total Score:  /120  PATIENT SURVEYS:  ABC scale 580 / 1600 = 36.3 % , scores of <50% indicating a low level of physical functioning and scores of <67% indicating a risk for falls in patients with vestibular disorders   TODAY'S TREATMENT:   04/03/2023 - Eval SELF CARE:  Reviewed eval findings and role of PT in addressing identified deficits as well as need for further  testing to address possibility of orthostatic hypotension as well as determine fall risk.  Provided education on managing dizziness with transitional motions, making sure to pause long enough for dizziness to resolve before continuing with mobility.   PATIENT EDUCATION:  Education details: PT eval findings, anticipated POC, and need for further assessment of formal balance testing   Person educated: Patient Education method: Explanation Education comprehension: verbalized understanding  HOME EXERCISE PROGRAM: TBD   ASSESSMENT:  CLINICAL IMPRESSION: [name] is a 83 y.o. female who was referred to physical therapy for evaluation and treatment for myalgia, unsteady gait, and weakness.  She reports a several month history of L anterolateral hip pain for which no bony abnormality detected on x-rays.  Patient presents with physical impairments of impaired activity tolerance, global UE/LE weakness, impaired standing balance, impaired ambulation, inability to navigate stairs and decreased safety awareness impacting safe and independent functional mobility.  She reports frequent dizziness with change of position, likely orthostatic in nature, but will benefit from formal orthostatic hypotension assessment vs vestibular eval in upcoming visits.  Formal balance testing to be completed next visit but anticipate high risk for falls.  ABC scale score of 580/1600 indicates only 36.3 % balance confidence, with scores of <50% indicating a low level of physical functioning and scores of <67% indicating a risk for falls in patients with vestibular disorders.  [name] will benefit from skilled PT to address above deficits to improve mobility and activity tolerance to help reach the maximal level of functional independence and mobility. Patient demonstrates understanding of this POC and is in agreement with this plan.   OBJECTIVE IMPAIRMENTS: Abnormal gait, decreased activity tolerance, decreased balance,  decreased coordination, decreased endurance, decreased knowledge of condition, decreased knowledge of use of DME, decreased mobility, difficulty walking, decreased ROM, decreased strength, decreased safety awareness, dizziness, increased fascial restrictions, impaired perceived functional ability, increased muscle spasms, impaired flexibility, impaired sensation, impaired UE functional use, improper body mechanics, postural dysfunction, and pain.   ACTIVITY LIMITATIONS: carrying, lifting,  bending, standing, squatting, sleeping, stairs, transfers, bed mobility, and locomotion level  PARTICIPATION LIMITATIONS: meal prep, cleaning, laundry, driving, shopping, community activity, and church  PERSONAL FACTORS: Age, Fitness, Past/current experiences, Social background, Time since onset of injury/illness/exacerbation, and 3+ comorbidities: Extensive PMH including chronic back and neck pain, OA, osteoporosis, DM, HTN, h/o TIA, diabetic peripheral neuropathy, PVD, headaches, sleep dysfunction, anxiety, R breast cancer s/p lumpectomy 07/31/22, SVT (refer to above problem list and past medical/surgical history for full PMH)   are also affecting patient's functional outcome.   REHAB POTENTIAL: Good  CLINICAL DECISION MAKING: Unstable/unpredictable  EVALUATION COMPLEXITY: High   GOALS: Goals reviewed with patient? Yes  SHORT TERM GOALS: Target date: 05/01/2023  Patient will be independent with initial HEP to improve outcomes and carryover.  Baseline:  Goal status: INITIAL  2.  Patient will be educated on strategies to decrease risk of falls.  Baseline:  Goal status: INITIAL  3.  Patient will demonstrate decreased TUG time to </= 13.5 sec to decrease risk for falls with transitional mobility. Baseline:  Goal status: INITIAL  LONG TERM GOALS: Target date: 05/29/2023  Patient will be independent with advanced/ongoing HEP to facilitate ability to maintain/progress functional gains from skilled physical  therapy services. Baseline:  Goal status: INITIAL  2.  Patient will be able to ambulate 600' with or w/o LRAD on variable surfaces with good safety to access community.  Baseline:  Goal status: INITIAL  3.  Patient will be able to step up/down curb safely with or w/o LRAD for safety with community ambulation.  Baseline:  Goal status: INITIAL   4.  Patient will demonstrate improved B LE strength to >/= 4/5 for improved stability and ease of mobility. Baseline: Refer to above LE MMT table Goal status: INITIAL  5.  Patient will improve 5x STS time to </= 15 seconds for improved efficiency and safety with transfers. Baseline:  Goal status: INITIAL   6.  Patient will demonstrate gait speed of >/= 2.62 ft/sec (0.8 m/s) to be a safe community ambulator with decreased risk for recurrent falls.  Baseline:  Goal status: INITIAL  7.  Patient will improve Berg score by at least 8 points to improve safety and stability with ADLs in standing and reduce risk for falls.  Baseline:  Goal status: INITIAL  8.  Patient will demonstrate at least 19/24 on DGI or 19/30 on FGA to improve gait stability and reduce risk for falls. Baseline:  Goal status: INITIAL  9. Patient will report >/= 50% on ABC scale to demonstrate improved balance confidence with functional mobility and gait. Baseline: 580 / 1600 = 36.3 % Goal status: INITIAL   PLAN:  PT FREQUENCY: 2x/week  PT DURATION: 8 weeks  PLANNED INTERVENTIONS: 97164- PT Re-evaluation, 97110-Therapeutic exercises, 97530- Therapeutic activity, 97112- Neuromuscular re-education, 97535- Self Care, 29562- Manual therapy, [phone]- Gait training, [phone]- Canalith repositioning, [id number]- Electrical stimulation (unattended), 97035- Ultrasound, 28413- Ionotophoresis 4mg /ml Dexamethasone, Patient/Family education, Balance training, Stair training, Taping, Dry Needling, Joint mobilization, Spinal mobilization, Vestibular training, Cryotherapy, Moist heat, and 97750-  Physical performance test or measurement  PLAN FOR NEXT SESSION: Assess for orthostatic hypotension, complete balance assessment   [name], PT 04/03/2023, 7:16 PM    Date of referral: 01/16/2023 Referring provider: [name], MD Referring diagnosis?  M79.10 (ICD-10-CM) - Myalgia  R26.81 (ICD-10-CM) - Unsteady gait  R53.1 (ICD-10-CM) - Weakness  Treatment diagnosis? (if different than referring diagnosis)  Muscle weakness (generalized)  Other abnormalities of gait and mobility  Unsteadiness on feet  Pain in left hip  What was this (referring dx) caused by? Ongoing Issue and Other: Sequelae of treatments for breast cancer  Nature of Condition: Chronic (continuous duration > 3 months)   Laterality: Lt  Current Functional Measure Score: Other ABC scale = 580 / 1600 = 36.3 %  Objective measurements identify impairments when they are compared to normal values, the uninvolved extremity, and prior level of function.  [x]  Yes  []  No  Objective assessment of functional ability: Severe functional limitations   Briefly describe symptoms: Patient presents with physical impairments of L anterior lateral hip pain, impaired activity tolerance, global UE/LE weakness, dizziness, impaired standing balance, impaired ambulation, inability to navigate stairs and decreased safety awareness impacting safe and independent functional mobility.    How did symptoms start: Gradual onset exacerbated by treatment for breast cancer starting May 2024  Average pain intensity:  Last 24 hours: 4-5/10  Past week: up to 8-9/10  How often does the pt experience symptoms? Constantly  How much have the symptoms interfered with usual daily activities? Extremely  How has condition changed since care began at this facility? NA - initial visit  In general, how is the patients overall health? Fair  Onset date: Chronic   BACK PAIN (STarT Back Screening Tool) - (When applicable): N/A  Has  your back pain spread down your leg(s) at sometime in the last 2 weeks? []  Yes   []  No Have you had pain in the shoulder or neck at sometime in the past 2 weeks? []  Yes   []  No Have you only walked short distances because of your back pain? []  Yes   []  No In the past 2 weeks, have you dressed more slowly than usual because of your back pain? []  Yes   []  No Do you think it is not really safe for person with a condition like yours to be physically active? []  Yes   []  No Have worrying thoughts been going through your mind a lot of the time? []  Yes   []  No Do you feel that your back pain is terrible and it is never going to get any better? []  Yes   []  No In general, have you stopped enjoying all the things you usually enjoy? []  Yes   []  No Overall, how bothersome has your back pain been in the last 2 weeks? []  Not at all   []  Slightly     []  Moderate   []  Very much     []  Extremely

## 2023-03-30 NOTE — Telephone Encounter (Signed)
Spoke with patient regarding CT results & recommendations. She has questions about a new medication oncology is wanting to prescribe, advised her to discuss further with them. She is requesting a later appointment than what she is currently scheduled for, rescheduled to 05/07/23 at 1:30 pm.

## 2023-03-30 NOTE — Telephone Encounter (Signed)
Let her know the CT did not show any diverticulitis or other abnormalities. No signs of cancer.  I will review further and reassess at 04/17/23 appointment

## 2023-04-03 ENCOUNTER — Encounter: Payer: Self-pay | Admitting: Physical Therapy

## 2023-04-03 ENCOUNTER — Other Ambulatory Visit: Payer: Self-pay

## 2023-04-03 ENCOUNTER — Ambulatory Visit: Payer: Medicare Other | Attending: Family Medicine | Admitting: Physical Therapy

## 2023-04-03 DIAGNOSIS — M6281 Muscle weakness (generalized): Secondary | ICD-10-CM | POA: Diagnosis not present

## 2023-04-03 DIAGNOSIS — M25552 Pain in left hip: Secondary | ICD-10-CM | POA: Insufficient documentation

## 2023-04-03 DIAGNOSIS — R2689 Other abnormalities of gait and mobility: Secondary | ICD-10-CM | POA: Diagnosis not present

## 2023-04-03 DIAGNOSIS — M791 Myalgia, unspecified site: Secondary | ICD-10-CM | POA: Diagnosis not present

## 2023-04-03 DIAGNOSIS — R2681 Unsteadiness on feet: Secondary | ICD-10-CM | POA: Diagnosis not present

## 2023-04-03 DIAGNOSIS — R531 Weakness: Secondary | ICD-10-CM | POA: Diagnosis not present

## 2023-04-03 NOTE — Addendum Note (Signed)
 Addended by: Marry Guan on: 04/03/2023 08:35 PM   Modules accepted: Orders

## 2023-04-09 ENCOUNTER — Ambulatory Visit: Payer: Medicare Other | Attending: Family Medicine | Admitting: Physical Therapy

## 2023-04-09 ENCOUNTER — Encounter: Payer: Self-pay | Admitting: Physical Therapy

## 2023-04-09 ENCOUNTER — Telehealth: Payer: Self-pay | Admitting: *Deleted

## 2023-04-09 DIAGNOSIS — R2689 Other abnormalities of gait and mobility: Secondary | ICD-10-CM | POA: Insufficient documentation

## 2023-04-09 DIAGNOSIS — R2681 Unsteadiness on feet: Secondary | ICD-10-CM | POA: Insufficient documentation

## 2023-04-09 DIAGNOSIS — M6281 Muscle weakness (generalized): Secondary | ICD-10-CM | POA: Diagnosis not present

## 2023-04-09 DIAGNOSIS — M25552 Pain in left hip: Secondary | ICD-10-CM | POA: Diagnosis not present

## 2023-04-09 NOTE — Therapy (Signed)
 OUTPATIENT PHYSICAL THERAPY TREATMENT   Patient Name: [name] MRN: [id number] DOB:06/07/1940, 83 y.o., female Today's Date: 04/09/2023   END OF SESSION:  PT End of Session - 04/09/23 1453     Visit Number 2    Date for PT Re-Evaluation 05/29/23    Authorization Type [employer] Medicare    Authorization Time Period 04/03/23 - 05/29/23    Authorization - Visit Number 2    Authorization - Number of Visits 6    PT Start Time 1448    PT Stop Time 1544    PT Time Calculation (min) 56 min    Activity Tolerance Patient tolerated treatment well    Behavior During Therapy [facility] for tasks assessed/performed              Past Medical History:  Diagnosis Date   Abdominal pain in female 03/18/2010   Qualifier: Diagnosis of  By: Leone Payor MD, Alfonse Ras E    Anemia 06/08/2014   Anxiety    Arthritis    Spinal Osteoarthritis   Breast cancer (HCC)    Carcinoid tumor of stomach    Cataract    Chest pain    Myoview 12/15 no ischemia.   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 09/05/2006   Qualifier: Diagnosis of  By: Charlsie Quest RMA, Lucy     Diabetic peripheral neuropathy (HCC) 10/29/2013   Encounter for preventative adult health care exam with abnormal findings 09/14/2013   Esophageal reflux    Gastric polyp    Fundic Gland   Gastroparesis    Headache(784.0)    Heart murmur    Echocardiogram 2/11: EF 60-65%, mild LAE, grade 1 diastolic dysfunction, aortic valve sclerosis, mean gradient 9 mm of mercury, PASP 34   Hematuria 03/16/2016   Iron deficiency anemia, unspecified    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   Leg swelling    bilateral   Neck pain 04/22/2015   PONV (postoperative nausea and vomiting)    pt states body temperature drops every time she has anesthesia; pt states only needs small amount of anesthesia   PSVT (paroxysmal supraventricular tachycardia) (HCC)    Pure hypercholesterolemia     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   Renal insufficiency 06/18/2019   pt states  L kidney function very low-functions at about 20%   Stroke [facility])    tia, 2014   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;  Service: General;  Laterality: Right;   CHOLECYSTECTOMY  1993   COLONOSCOPY  11/11/2010   diverticulosis   DILATATION & CURRETTAGE/HYSTEROSCOPY WITH RESECTOCOPE N/A 02/25/2013   Procedure: Attempted hysteroscopy with uterine perforation;  Surgeon: Jacqualin Combes de Gwenevere Ghazi, MD;  Location: [facility];  Service: Gynecology;  Laterality: N/A;   ESOPHAGOGASTRODUODENOSCOPY  08/29/2010; 09/15/2010   Carcinoid tumor less than 1 cm in July 2012 not seen in August 2012 , gastritis, fundic  gland polyps   ESOPHAGOGASTRODUODENOSCOPY  05/16/2011   ESOPHAGOGASTRODUODENOSCOPY  06/14/2012   EUS  12/15/2010   Procedure: UPPER ENDOSCOPIC ULTRASOUND (EUS) LINEAR;  Surgeon: Rob Bunting, MD;  Location: [facility];  Service: Endoscopy;  Laterality: N/A;   EYE SURGERY Bilateral    Bi lateral cateracts and bi lateral laser   LAPAROSCOPY N/A 02/25/2013   Procedure: Cystoscopy and laparoscopy with fulguration of uterine serosa;  Surgeon: Jacqualin Combes de Gwenevere Ghazi, MD;  Location: [facility];  Service: Gynecology;  Laterality: N/A;   TONSILLECTOMY     Patient Active Problem List   Diagnosis Date Noted   Precordial chest pain 12/24/2022   Carcinoma of breast upper outer quadrant,  right (HCC) 07/04/2022   SVT (supraventricular tachycardia) (HCC) 06/21/2022   Left-sided weakness 04/02/2022   Unsteady gait 04/02/2022   Oral lesion 03/08/2021   Sun-damaged skin 03/08/2021   Thiamine deficiency 11/01/2020   Trigeminal neuralgia 08/21/2020   Pedal edema 08/21/2020   Chronic left-sided back pain 03/28/2020   Nocturia 03/28/2020   Left-sided headache 03/28/2020   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   Renal insufficiency 06/18/2019   Educated about COVID-19 virus infection 05/15/2019   Atrophic vaginitis 01/20/2019   Stenosis of carotid artery 11/12/2018   Anxiety 10/13/2018   Referred otalgia of left ear 04/23/2018   Chronic throat clearing 04/23/2018   Sinusitis 10/11/2017   Headache 07/12/2017   Dizzy spells 11/21/2016   Constipation 11/21/2016   Nonrheumatic aortic valve stenosis 05/23/2016   Aortic atherosclerosis (HCC) 05/23/2016   Hematuria 03/16/2016   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   Pain of upper abdomen 06/06/2015   Neck pain 04/22/2015   Ear pain 02/28/2015   Abnormal urine 11/21/2014   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   Anemia 06/08/2014   RLS (restless legs syndrome) 11/23/2013   Diabetic peripheral neuropathy (HCC) 10/29/2013   Left-sided thoracic back pain 10/06/2013   Encounter for preventative adult health care exam with abnormal findings 09/14/2013   Iron deficiency anemia    Status post laparoscopy 02/25/2013   Hyponatremia 01/09/2013   GERD (gastroesophageal reflux disease) 01/09/2013   Amaurosis fugax of left eye 10/16/2012   Low back pain 06/03/2012   Vitamin D deficiency 03/11/2012   Bilateral hand pain 10/20/2011   Encounter for long-term (current) use of other medications 10/20/2011   IBS (irritable bowel syndrome) 08/14/2011   TIA (transient ischemic attack) 02/10/2011   Abnormal brain CT 01/19/2011   Allergic rhinitis 10/01/2010   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   Preventative health care  07/15/2010   FUNDIC GLAND POLYPS OF STOMACH 03/18/2010   Abdominal pain in female 03/18/2010   Left hip pain 03/17/2009   SYSTOLIC MURMUR 03/02/2009   Paroxysmal supraventricular tachycardia (HCC) 01/12/2009   PLANTAR FASCIITIS 06/08/2008   CHEST PAIN 05/18/2008   Gastroparesis 12/18/2007   HYPERCHOLESTEROLEMIA 06/11/2007   Type 2 diabetes mellitus with obesity (HCC) 09/05/2006   Essential hypertension 09/05/2006    PCP: [name], MD   REFERRING PROVIDER: [name], MD   REFERRING DIAG:  M79.10 (ICD-10-CM) - Myalgia  R26.81 (ICD-10-CM) - Unsteady gait  R53.1 (ICD-10-CM) - Weakness   THERAPY DIAG:  Muscle weakness (generalized)  Other abnormalities of gait and mobility  Unsteadiness on feet  Pain in left hip  RATIONALE FOR EVALUATION AND TREATMENT: Rehabilitation  ONSET DATE: Worsening over the past year, especially since breast cancer diagnosis and May 2024  NEXT MD VISIT: 04/23/2023   SUBJECTIVE:  SUBJECTIVE STATEMENT: Pt reports new acute on chronic pain in upper shoulders today.   EVAL: Pt reports her weakness and pains seems to be exacerbated by recent meds she has had to take. She notes difficulty climbing stairs and does not feel safe using a step stool to reach high things.  She feels like she has no energy.  She reports chronic belly pain, maybe from meds vs diverticulitis.  L hip pain has been an issue for several months but patient has been told there is no "bony" reason for her pain (x-rays negative).  PAIN: Are you having pain? Yes: NPRS scale: 7/10  Pain location: L lateral hip Pain description: dull, sharp when more intense  Aggravating factors: lying on side to sleep (worse when lying on L than R), first moving in the morning Relieving factors:  walking, Tylenol   Are you having pain? Yes: NPRS scale: 8/10 Pain location: B upper shoulders Pain description: pinching Aggravating factors: uncertain Relieving factors: keep moving  PERTINENT HISTORY:  Extensive PMH including chronic back and neck pain, OA, osteoporosis, DM, HTN, h/o TIA, diabetic peripheral neuropathy, PVD, headaches, sleep dysfunction, anxiety, R breast cancer s/p lumpectomy 07/31/22, SVT (refer to above problem list and past medical/surgical history for full PMH)   PRECAUTIONS: Fall  RED FLAGS: None  WEIGHT BEARING RESTRICTIONS: No  FALLS:  Has patient fallen in last 6 months? No - admits to a few close calls due to dizziness/lightheadness  LIVING ENVIRONMENT: Lives with: lives alone  Lives in: House/apartment Stairs: No Has following equipment at home: Single point cane, Environmental consultant - 4 wheeled, shower chair, and Grab bars  OCCUPATION: Retired  PLOF: Independent, Needs assistance with homemaking, Leisure: read and play card games on her phone, traveling, and housekeeping/cleaning service ~1x/month    PATIENT GOALS: "To be able to walk steady and feel comfortable going up stairs so I can visit my niece and nephew. To be able to carry groceries."   OBJECTIVE: (objective measures completed at initial evaluation unless otherwise dated)  DIAGNOSTIC FINDINGS:  01/22/23 & 10/05/22 - DH Left hip (2 several months of L hip pain) IMPRESSION: Negative.  COGNITION: Overall cognitive status: Within functional limits for tasks assessed   SENSATION: Numbness and tingling in hands and feet from DM and cervical radiculopathy  POSTURE:  rounded shoulders, forward head, increased thoracic kyphosis, and flexed trunk   PALPATION: Increased TTP over L TFL, hip flexors and abductors  UPPER EXTREMITY MMT:  MMT Right eval Left eval  Shoulder flexion 4- 4-  Shoulder extension 3+ 3+  Shoulder abduction 4- 4-  Shoulder adduction    Shoulder internal rotation 4- 4-   Shoulder external rotation 3+ 3+  Middle trapezius    Lower trapezius     (Blank rows = not tested)  MUSCLE LENGTH: Hamstrings: mod tight B ITB: mod tight B  Piriformis: severe tight B Hip flexors: mild/mod tight L>R  Quads: mild/mod tight L>R  Heelcord: mod tight B  LOWER EXTREMITY ROM:    Mildly limited primarily due to muscle tightness  LOWER EXTREMITY MMT:  (tested in sitting on eval)  MMT Right eval Left eval  Hip flexion 3+ 3+  Hip extension 3- 3-  Hip abduction 3+ 3+  Hip adduction 3+ 3+  Hip internal rotation 3- 3-  Hip external rotation 3- 3-  Knee flexion 4- 4-  Knee extension 4- 4  Ankle dorsiflexion 3+ 4-  Ankle plantarflexion    Ankle inversion    Ankle eversion    (  Blank rows = not tested)  BED MOBILITY:  Sit to supine Min A Supine to sit Min A Rolling to Right SBA  TRANSFERS: Assistive device utilized: None  Sit to stand: Complete Independence Stand to sit: Complete Independence Chair to chair: Modified independence Floor:  NT  GAIT: Distance walked: Clinic distances Assistive device utilized: None Level of assistance: Complete Independence and SBA Gait pattern: decreased stride length and trunk flexed Comments: Decreased gait speed  RAMP: Level of Assistance:  NT Assistive device utilized:    Ramp Comments:   CURB:  Level of Assistance:  NT Assistive device utilized:    Curb Comments:   STAIRS: Level of Assistance: SBA Stair Negotiation Technique: Step to Pattern, Sideways (descent), Forwards (ascent) with Single Rail on Right Number of Stairs: 14  Height of Stairs: 7"  Comments: forward for ascent, sideways leading with R foot for descent    FUNCTIONAL TESTS: (TBA next visit) 5 times sit to stand: 30.43 sec; >15 sec indicates a risk for recurrent falls  Timed up and go (TUG): 16.03 sec; >13.5 sec indicates a high risk for falls  10 meter walk test: 13.91 sec ; Gait speed = 2.36 ft/sec; >/=2.62 ft/sec is necessary to be  considered a Materials engineer Scale: 44/56; Scores of 37-45 indicate significant risk for falls (>80%)  Dynamic Gait Index: 15/24; Scores of 19 or less are predictive of falls in older community living adults.    PATIENT SURVEYS:  ABC scale 580 / 1600 = 36.3 % , scores of <50% indicating a low level of physical functioning and scores of <67% indicating a risk for falls in patients with vestibular disorders   TODAY'S TREATMENT:   04/09/2023  PHYSICAL PERFORMANCE TEST or MEASUREMENT: Orthostatic BP assessment - SBP drop of >/= 20 points with associated dizziness/lightheadedness is indicative of postural/orthostatic hypotension  Orthostatics  BP supine (x 5 minutes) 130/60   HR supine (x 5 minutes) 65   BP sitting 114/54   HR sitting 68   BP standing (after 1 minute) 110/58   HR standing (after 1 minute) 64   BP standing (after 3 minutes) 120/58   HR standing (after 3 minutes) 62   Orthostatics Comment dizziness and lightheadedness reported with transitions supine to sit and sit to stand      5xSTS = 30.43 sec; ; >15 sec indicates a risk for recurrent falls TUG: 16.03 sec; >13.5 sec indicates a high risk for falls : 13.91 sec  Gait speed = 2.36 ft/sec; >/=2.62 ft/sec is necessary to be considered a Chief Operating Officer Scale: 44/56; 37-45 significant risk for falls (>80%)  Dynamic Gait Index: 15/24; Scores of 19 or less are predictive of falls in older community living adults.   Standardized Balance Assessment   Standardized Balance Assessment Berg Balance Test;Dynamic Gait Index      Berg Balance Test   Sit to Stand Able to stand without using hands and stabilize independently    Standing Unsupported Able to stand safely 2 minutes    Sitting with Back Unsupported but Feet Supported on Floor or Stool Able to sit safely and securely 2 minutes    Stand to Sit Sits safely with minimal use of hands    Transfers Able to transfer safely, minor use of  hands    Standing Unsupported with Eyes Closed Able to stand 10 seconds safely    Standing Unsupported with Feet Together Able to place feet together independently and stand for 1  minute with supervision    From Standing, Reach Forward with Outstretched Arm Can reach forward >12 cm safely (5")    From Standing Position, Pick up Object from Floor Able to pick up shoe, needs supervision    From Standing Position, Turn to Look Behind Over each Shoulder Needs supervision when turning    Turn 360 Degrees Able to turn 360 degrees safely but slowly    Standing Unsupported, Alternately Place Feet on Step/Stool Able to stand independently and complete 8 steps >20 seconds    Standing Unsupported, One Foot in Front Able to plae foot ahead of the other independently and hold 30 seconds    Standing on One Leg Able to lift leg independently and hold equal to or more than 3 seconds    Total Score 44    Berg comment: 37-45 significant (>80%)      Dynamic Gait Index   Level Surface Mild Impairment    Change in Gait Speed Severe Impairment    Gait with Horizontal Head Turns Normal    Gait with Vertical Head Turns Mild Impairment    Gait and Pivot Turn Mild Impairment    Step Over Obstacle Mild Impairment    Step Around Obstacles Normal    Steps Moderate Impairment    Total Score 15    DGI comment: Scores of 19 or less are predictive of falls in older community living adults.       04/03/2023 - Eval SELF CARE:  Reviewed eval findings and role of PT in addressing identified deficits as well as need for further testing to address possibility of orthostatic hypotension as well as determine fall risk.  Provided education on managing dizziness with transitional motions, making sure to pause long enough for dizziness to resolve before continuing with mobility.   PATIENT EDUCATION:  Education details: PT eval findings, anticipated POC, and orthostatic hypotension precautions   Person educated:  Patient Education method: Explanation Education comprehension: verbalized understanding  HOME EXERCISE PROGRAM: TBD   ASSESSMENT:  CLINICAL IMPRESSION: Completed orthostatic BP assessment with patient demonstrating a cumulative SBP drop of 20 points from supine to standing is indicative of postural/orthostatic hypotension - patient symptomatic with c/o dizziness/lightheadedness with both transition supine to sit and sit to stand.  Standardized balance assessment completed: Gait speed 2.36 ft/sec, (2.62 ft/sec is needed for community access), TUG = 16.03 sec (>13.5 sec indicates increased risk for falls), and 5xSTS = 30.43 sec (>15 sec indicates increased risk for falls and decreased BLE power), Berg = 44/56 (indicative of a significant (>80%) risk for falls) and DGI = 15/24 (</=19/24 is predictive of falls in older community living adults). Results of all testing and associated risk for falls reviewed with patient along with instructions reviewed for safe mobility allowing symptoms of orthostatic hypotension to resolve before proceeding with movement/further change of position to reduce fall risk.  [name] will benefit from continued skilled PT to address above deficits and impairments to improve mobility and activity tolerance to help reach the maximal level of functional independence and mobility.    OBJECTIVE IMPAIRMENTS: Abnormal gait, decreased activity tolerance, decreased balance, decreased coordination, decreased endurance, decreased knowledge of condition, decreased knowledge of use of DME, decreased mobility, difficulty walking, decreased ROM, decreased strength, decreased safety awareness, dizziness, increased fascial restrictions, impaired perceived functional ability, increased muscle spasms, impaired flexibility, impaired sensation, impaired UE functional use, improper body mechanics, postural dysfunction, and pain.   ACTIVITY LIMITATIONS: carrying, lifting, bending, standing,  squatting, sleeping, stairs, transfers, bed  mobility, and locomotion level  PARTICIPATION LIMITATIONS: meal prep, cleaning, laundry, driving, shopping, community activity, and church  PERSONAL FACTORS: Age, Fitness, Past/current experiences, Social background, Time since onset of injury/illness/exacerbation, and 3+ comorbidities: Extensive PMH including chronic back and neck pain, OA, osteoporosis, DM, HTN, h/o TIA, diabetic peripheral neuropathy, PVD, headaches, sleep dysfunction, anxiety, R breast cancer s/p lumpectomy 07/31/22, SVT (refer to above problem list and past medical/surgical history for full PMH)   are also affecting patient's functional outcome.   REHAB POTENTIAL: Good  CLINICAL DECISION MAKING: Unstable/unpredictable  EVALUATION COMPLEXITY: High   GOALS: Goals reviewed with patient? Yes  SHORT TERM GOALS: Target date: 05/01/2023  Patient will be independent with initial HEP to improve outcomes and carryover.  Baseline:  Goal status: IN PROGRESS  2.  Patient will be educated on strategies to decrease risk of falls.  Baseline:  Goal status: IN PROGRESS  3.  Patient will demonstrate decreased TUG time to </= 13.5 sec to decrease risk for falls with transitional mobility. Baseline:  Goal status: IN PROGRESS  LONG TERM GOALS: Target date: 05/29/2023  Patient will be independent with advanced/ongoing HEP to facilitate ability to maintain/progress functional gains from skilled physical therapy services. Baseline:  Goal status: IN PROGRESS  2.  Patient will be able to ambulate 600' with or w/o LRAD on variable surfaces with good safety to access community.  Baseline:  Goal status: IN PROGRESS  3.  Patient will be able to step up/down curb safely with or w/o LRAD for safety with community ambulation.  Baseline:  Goal status: IN PROGRESS   4.  Patient will demonstrate improved B LE strength to >/= 4/5 for improved stability and ease of mobility. Baseline: Refer to  above LE MMT table Goal status: IN PROGRESS  5.  Patient will improve 5x STS time to </= 15 seconds for improved efficiency and safety with transfers. Baseline: 30.43 sec (04/09/23) Goal status: IN PROGRESS   6.  Patient will demonstrate gait speed of >/= 2.62 ft/sec (0.8 m/s) to be a safe community ambulator with decreased risk for recurrent falls.  Baseline: 2.36 ft/sec (04/09/23) Goal status: IN PROGRESS  7.  Patient will improve Berg score by at least 8 points to improve safety and stability with ADLs in standing and reduce risk for falls.  Baseline: 44/56 (04/09/23) Goal status: IN PROGRESS  8.  Patient will demonstrate at least 19/24 on DGI or 19/30 on FGA to improve gait stability and reduce risk for falls. Baseline: 15/24 (04/09/23) Goal status: IN PROGRESS  9. Patient will report >/= 50% on ABC scale to demonstrate improved balance confidence with functional mobility and gait. Baseline: 580 / 1600 = 36.3 % Goal status: IN PROGRESS   PLAN:  PT FREQUENCY: 2x/week  PT DURATION: 8 weeks  PLANNED INTERVENTIONS: 97164- PT Re-evaluation, 97110-Therapeutic exercises, 97530- Therapeutic activity, 97112- Neuromuscular re-education, 97535- Self Care, 16109- Manual therapy, [phone]- Gait training, [phone]- Canalith repositioning, [id number]- Electrical stimulation (unattended), 97035- Ultrasound, 29562- Ionotophoresis 4mg /ml Dexamethasone, Patient/Family education, Balance training, Stair training, Taping, Dry Needling, Joint mobilization, Spinal mobilization, Vestibular training, Cryotherapy, Moist heat, and 97750- Physical performance test or measurement  PLAN FOR NEXT SESSION: Review falls risk checklist; create initial HEP for postural, core and LE strengthening    Marry Guan, PT 04/09/2023, 5:40 PM    Date of referral: 01/16/2023 Referring provider: [name], MD Referring diagnosis?  M79.10 (ICD-10-CM) - Myalgia  R26.81 (ICD-10-CM) - Unsteady gait  R53.1 (ICD-10-CM) - Weakness   Treatment  diagnosis? (if different than referring diagnosis)  Muscle weakness (generalized)  Other abnormalities of gait and mobility  Unsteadiness on feet  Pain in left hip  What was this (referring dx) caused by? Ongoing Issue and Other: Sequelae of treatments for breast cancer  Nature of Condition: Chronic (continuous duration > 3 months)   Laterality: Lt  Current Functional Measure Score: Other ABC scale = 580 / 1600 = 36.3 %  Objective measurements identify impairments when they are compared to normal values, the uninvolved extremity, and prior level of function.  [x]  Yes  []  No  Objective assessment of functional ability: Severe functional limitations   Briefly describe symptoms: Patient presents with physical impairments of L anterior lateral hip pain, impaired activity tolerance, global UE/LE weakness, dizziness, impaired standing balance, impaired ambulation, inability to navigate stairs and decreased safety awareness impacting safe and independent functional mobility.    How did symptoms start: Gradual onset exacerbated by treatment for breast cancer starting May 2024  Average pain intensity:  Last 24 hours: 4-5/10  Past week: up to 8-9/10  How often does the pt experience symptoms? Constantly  How much have the symptoms interfered with usual daily activities? Extremely  How has condition changed since care began at this facility? NA - initial visit  In general, how is the patients overall health? Fair  Onset date: Chronic   BACK PAIN (STarT Back Screening Tool) - (When applicable): N/A  Has your back pain spread down your leg(s) at sometime in the last 2 weeks? []  Yes   []  No Have you had pain in the shoulder or neck at sometime in the past 2 weeks? []  Yes   []  No Have you only walked short distances because of your back pain? []  Yes   []  No In the past 2 weeks, have you dressed more slowly than usual because of your back pain? []  Yes   []  No Do you  think it is not really safe for person with a condition like yours to be physically active? []  Yes   []  No Have worrying thoughts been going through your mind a lot of the time? []  Yes   []  No Do you feel that your back pain is terrible and it is never going to get any better? []  Yes   []  No In general, have you stopped enjoying all the things you usually enjoy? []  Yes   []  No Overall, how bothersome has your back pain been in the last 2 weeks? []  Not at all   []  Slightly     []  Moderate   []  Very much     []  Extremely

## 2023-04-09 NOTE — Telephone Encounter (Signed)
 Call received from patient stating that she would like to move her already scheduled appts on 04/19/23 out to the week of 05/21/23 d/t she has not started the Arimidex yet and plans to start it this week.  Dr. Myna Hidalgo notified and message sent to scheduling.

## 2023-04-16 ENCOUNTER — Other Ambulatory Visit (INDEPENDENT_AMBULATORY_CARE_PROVIDER_SITE_OTHER): Payer: Medicare Other

## 2023-04-16 ENCOUNTER — Encounter: Payer: Self-pay | Admitting: Family Medicine

## 2023-04-16 DIAGNOSIS — E7800 Pure hypercholesterolemia, unspecified: Secondary | ICD-10-CM | POA: Diagnosis not present

## 2023-04-16 DIAGNOSIS — E519 Thiamine deficiency, unspecified: Secondary | ICD-10-CM | POA: Diagnosis not present

## 2023-04-16 DIAGNOSIS — I10 Essential (primary) hypertension: Secondary | ICD-10-CM | POA: Diagnosis not present

## 2023-04-16 DIAGNOSIS — E559 Vitamin D deficiency, unspecified: Secondary | ICD-10-CM | POA: Diagnosis not present

## 2023-04-16 DIAGNOSIS — E1169 Type 2 diabetes mellitus with other specified complication: Secondary | ICD-10-CM

## 2023-04-16 DIAGNOSIS — E669 Obesity, unspecified: Secondary | ICD-10-CM

## 2023-04-16 LAB — CBC WITH DIFFERENTIAL/PLATELET
Basophils Absolute: 0.1 10*3/uL (ref 0.0–0.1)
Basophils Relative: 0.6 % (ref 0.0–3.0)
Eosinophils Absolute: 0.1 10*3/uL (ref 0.0–0.7)
Eosinophils Relative: 1.3 % (ref 0.0–5.0)
HCT: 37.8 % (ref 36.0–46.0)
Hemoglobin: 12.6 g/dL (ref 12.0–15.0)
Lymphocytes Relative: 34.9 % (ref 12.0–46.0)
Lymphs Abs: 2.7 10*3/uL (ref 0.7–4.0)
MCHC: 33.5 g/dL (ref 30.0–36.0)
MCV: 84.9 fl (ref 78.0–100.0)
Monocytes Absolute: 0.8 10*3/uL (ref 0.1–1.0)
Monocytes Relative: 10 % (ref 3.0–12.0)
Neutro Abs: 4.2 10*3/uL (ref 1.4–7.7)
Neutrophils Relative %: 53.2 % (ref 43.0–77.0)
Platelets: 219 10*3/uL (ref 150.0–400.0)
RBC: 4.45 Mil/uL (ref 3.87–5.11)
RDW: 13.2 % (ref 11.5–15.5)
WBC: 7.9 10*3/uL (ref 4.0–10.5)

## 2023-04-16 LAB — COMPREHENSIVE METABOLIC PANEL
ALT: 18 U/L (ref 0–35)
AST: 18 U/L (ref 0–37)
Albumin: 4.5 g/dL (ref 3.5–5.2)
Alkaline Phosphatase: 62 U/L (ref 39–117)
BUN: 25 mg/dL — ABNORMAL HIGH (ref 6–23)
CO2: 28 meq/L (ref 19–32)
Calcium: 9.9 mg/dL (ref 8.4–10.5)
Chloride: 96 meq/L (ref 96–112)
Creatinine, Ser: 0.83 mg/dL (ref 0.40–1.20)
GFR: 65.83 mL/min (ref >60.00)
Glucose, Bld: 134 mg/dL — ABNORMAL HIGH (ref 70–99)
Potassium: 4.5 meq/L (ref 3.5–5.1)
Sodium: 134 meq/L — ABNORMAL LOW (ref 135–145)
Total Bilirubin: 0.6 mg/dL (ref 0.2–1.2)
Total Protein: 7.1 g/dL (ref 6.0–8.3)

## 2023-04-16 LAB — VITAMIN D 25 HYDROXY (VIT D DEFICIENCY, FRACTURES): VITD: 46.04 ng/mL (ref 30.00–100.00)

## 2023-04-16 LAB — LIPID PANEL
Cholesterol: 193 mg/dL (ref 0–200)
HDL: 67.7 mg/dL (ref >39.00)
LDL Cholesterol: 100 mg/dL — ABNORMAL HIGH (ref 0–99)
NonHDL: 125.51
Total CHOL/HDL Ratio: 3
Triglycerides: 128 mg/dL (ref 0.0–149.0)
VLDL: 25.6 mg/dL (ref 0.0–40.0)

## 2023-04-16 LAB — HEMOGLOBIN A1C: Hgb A1c MFr Bld: 6.7 % — ABNORMAL HIGH (ref 4.6–6.5)

## 2023-04-16 LAB — TSH: TSH: 2.37 u[IU]/mL (ref 0.35–5.50)

## 2023-04-17 ENCOUNTER — Ambulatory Visit: Payer: Medicare Other | Admitting: Internal Medicine

## 2023-04-17 ENCOUNTER — Encounter: Payer: Medicare Other | Admitting: Physical Therapy

## 2023-04-17 NOTE — Telephone Encounter (Signed)
 Tried to return patient's call. No answer. LVM

## 2023-04-17 NOTE — Telephone Encounter (Signed)
 Copied from CRM [id number]. Topic: General - Other >> Apr 17, 2023  3:23 PM Eunice Blase wrote: Reason for CRM: Pt returning call provided lab results.

## 2023-04-18 ENCOUNTER — Telehealth: Payer: Self-pay | Admitting: Emergency Medicine

## 2023-04-18 NOTE — Telephone Encounter (Signed)
 Called and followed up with patient. She said she would speak with provider at her appointment next week (04/23/23)

## 2023-04-18 NOTE — Telephone Encounter (Signed)
 Copied from CRM [id number]. Topic: Clinical - Lab/Test Results >> Apr 17, 2023 12:12 PM Chantha C wrote: Reason for CRM: Patient wants to speak with nurse regarding lab results. Patient cannot open MyChart. Patient informed, 04/16/23 labs result. Patient asked for Cholesterol and A1C value. Values was provided. Patient verbalized understanding and denies any other concerns.

## 2023-04-18 NOTE — Telephone Encounter (Signed)
 Called and spoke with patient. She would like to discuss the letter she received with provider

## 2023-04-19 ENCOUNTER — Ambulatory Visit: Payer: Medicare Other | Admitting: Hematology & Oncology

## 2023-04-19 ENCOUNTER — Inpatient Hospital Stay: Payer: Medicare Other

## 2023-04-19 LAB — VITAMIN B1: Vitamin B1 (Thiamine): 13 nmol/L (ref 8–30)

## 2023-04-19 NOTE — Telephone Encounter (Signed)
 Patient has an upcoming visit (04/23/23). She said she will discuss letter at this time.

## 2023-04-22 NOTE — Assessment & Plan Note (Signed)
 Increase leafy greens, consider increased lean red meat and using cast iron cookware. Continue to monitor, report any concerns

## 2023-04-22 NOTE — Assessment & Plan Note (Signed)
Avoid offending foods, start probiotics. Do not eat large meals in late evening and consider raising head of bed. She is feeling much better, is on the Nexium in the morning and is not taking the Famotidine and still feels well. Use Famotidine prn if symptoms return. Continue Nexium daily. She will follow up with gastroenterology if symptoms return.

## 2023-04-22 NOTE — Assessment & Plan Note (Signed)
 Well controlled, no changes to meds. Encouraged heart healthy diet such as the DASH diet and exercise as tolerated.

## 2023-04-22 NOTE — Assessment & Plan Note (Signed)
 hgba1c acceptable, minimize simple carbs. Increase exercise as tolerated. Continue current meds

## 2023-04-22 NOTE — Assessment & Plan Note (Signed)
 Encourage heart healthy diet such as MIND or DASH diet, increase exercise, avoid trans fats, simple carbohydrates and processed foods, consider a krill or fish or flaxseed oil cap daily. Tolerating Rosuvastatin

## 2023-04-22 NOTE — Assessment & Plan Note (Signed)
 Supplement and monitor

## 2023-04-23 ENCOUNTER — Encounter: Payer: Self-pay | Admitting: Family Medicine

## 2023-04-23 ENCOUNTER — Ambulatory Visit (INDEPENDENT_AMBULATORY_CARE_PROVIDER_SITE_OTHER): Payer: Medicare Other | Admitting: Family Medicine

## 2023-04-23 VITALS — BP 132/52 | HR 65 | Resp 16 | Ht 61.0 in | Wt 165.6 lb

## 2023-04-23 DIAGNOSIS — K219 Gastro-esophageal reflux disease without esophagitis: Secondary | ICD-10-CM

## 2023-04-23 DIAGNOSIS — D500 Iron deficiency anemia secondary to blood loss (chronic): Secondary | ICD-10-CM

## 2023-04-23 DIAGNOSIS — R519 Headache, unspecified: Secondary | ICD-10-CM | POA: Diagnosis not present

## 2023-04-23 DIAGNOSIS — I10 Essential (primary) hypertension: Secondary | ICD-10-CM

## 2023-04-23 DIAGNOSIS — Z7984 Long term (current) use of oral hypoglycemic drugs: Secondary | ICD-10-CM

## 2023-04-23 DIAGNOSIS — E1169 Type 2 diabetes mellitus with other specified complication: Secondary | ICD-10-CM | POA: Diagnosis not present

## 2023-04-23 DIAGNOSIS — E7800 Pure hypercholesterolemia, unspecified: Secondary | ICD-10-CM | POA: Diagnosis not present

## 2023-04-23 DIAGNOSIS — E559 Vitamin D deficiency, unspecified: Secondary | ICD-10-CM

## 2023-04-23 DIAGNOSIS — E669 Obesity, unspecified: Secondary | ICD-10-CM

## 2023-04-23 DIAGNOSIS — E1142 Type 2 diabetes mellitus with diabetic polyneuropathy: Secondary | ICD-10-CM

## 2023-04-23 MED ORDER — FAMOTIDINE 40 MG PO TABS: 40.0000 mg | ORAL_TABLET | Freq: Every day | ORAL | 4 refills | Status: DC

## 2023-04-23 MED ORDER — METFORMIN HCL ER 500 MG PO TB24: 500.0000 mg | ORAL_TABLET | Freq: Every day | ORAL | Status: DC

## 2023-04-23 NOTE — Assessment & Plan Note (Signed)
 hgba1c acceptable, minimize simple carbs. Increase exercise as tolerated. Continue current meds

## 2023-04-23 NOTE — Progress Notes (Signed)
 Subjective:    [name], female    DOB: 1940/03/22, 83 y.o.   MRN: [id number]  Chief Complaint  Patient presents with   Follow-up    HPI Discussed the use of AI scribe software for clinical note transcription with the patient, who gave verbal consent to proceed.  History of Present Illness [name] is a 83 year old female who presents for review of recent blood work and medication management.  She has a history of hyponatremia, with recent blood work showing an improvement from 132 to 134 mEq/L, though still slightly below the normal range of 135 mEq/L.  She experiences consistently low blood pressure, particularly the diastolic number, and is attentive to any symptoms that may arise from this condition.  Her blood sugar levels are often 'borderline' and fluctuate throughout the day. She takes metformin 500 mg twice daily but is considering reducing the dose to once daily with dinner due to her blood sugar dropping by noon, causing symptoms like shaking when levels fall below 90 mg/dL. Her morning routine includes eating an egg, oatmeal with fruit and honey, or yogurt with olive oil, yet she still experiences low blood sugar by noon. She feels anxious about sleeping with low blood sugar levels and often eats a snack before bed to prevent this.  She has a history of cancer and was previously on letrozole, which she believes may have contributed to severe abdominal pain. After discontinuing letrozole, her pain improved. She started anastrozole about ten days ago and reports that it disturbs her sleep, but she has not experienced any abdominal pain since the switch. Her niece, who works in [employer], found an article suggesting a potential link between letrozole and diverticulitis, which she finds relevant to her experience.    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 09/05/2006   Qualifier: Diagnosis of  By: Charlsie Quest RMA, Lucy     Diabetic peripheral neuropathy (HCC) 10/29/2013   Encounter for preventative adult health care exam with abnormal findings 09/14/2013   Esophageal reflux    Gastric polyp    Fundic Gland   Gastroparesis    Headache(784.0)    Heart murmur    Echocardiogram 2/11: EF 60-65%, mild LAE, grade 1 diastolic dysfunction, aortic valve sclerosis, mean gradient 9 mm of mercury, PASP 34   Hematuria 03/16/2016   Iron deficiency anemia, unspecified    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   Leg swelling    bilateral   Neck pain 04/22/2015   PONV (postoperative nausea and vomiting)    pt states body temperature drops every time she has anesthesia; pt states only needs small amount of anesthesia   PSVT (paroxysmal supraventricular tachycardia) (HCC)    Pure hypercholesterolemia    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   Renal insufficiency 06/18/2019   pt states  L kidney function very low-functions at about 20%   Stroke (HCC)    tia, 2014   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   Type II  or unspecified type diabetes mellitus without mention of complication, not stated as uncontrolled    Unspecified essential hypertension    Unspecified hereditary and idiopathic peripheral neuropathy 10/29/2013   Vitamin D deficiency     Past Surgical History:  Procedure Laterality Date   BREAST BIOPSY Right 06/13/2022   [place] RT BREAST BX W LOC DEV 1ST LESION IMG BX SPEC US GUIDE 06/13/2022 [facility]   BREAST LUMPECTOMY Right 07/31/2022   Procedure: RIGHT BREAST LUMPECTOMY;  Surgeon: Abigail Miyamoto, MD;  Location: [facility];  Service:  General;  Laterality: Right;   CHOLECYSTECTOMY  1993   COLONOSCOPY  11/11/2010   diverticulosis   DILATATION & CURRETTAGE/HYSTEROSCOPY WITH RESECTOCOPE N/A 02/25/2013   Procedure: Attempted hysteroscopy with uterine perforation;  Surgeon: Jacqualin Combes de Gwenevere Ghazi, MD;  Location: [facility];  Service: Gynecology;  Laterality: N/A;   ESOPHAGOGASTRODUODENOSCOPY  08/29/2010; 09/15/2010   Carcinoid tumor less than 1 cm in July 2012 not seen in August 2012 , gastritis, fundic gland polyps   ESOPHAGOGASTRODUODENOSCOPY  05/16/2011   ESOPHAGOGASTRODUODENOSCOPY  06/14/2012   EUS  12/15/2010   Procedure: UPPER ENDOSCOPIC ULTRASOUND (EUS) LINEAR;  Surgeon: Rob Bunting, MD;  Location: [facility];  Service: Endoscopy;  Laterality: N/A;   EYE SURGERY Bilateral    Bi lateral cateracts and bi lateral laser   LAPAROSCOPY N/A 02/25/2013   Procedure: Cystoscopy and laparoscopy with fulguration of uterine serosa;  Surgeon: Jacqualin Combes de Gwenevere Ghazi, MD;  Location: [facility];  Service: Gynecology;  Laterality: N/A;   TONSILLECTOMY      Family History  Problem Relation Age of Onset   Diabetes Mother    Stroke Father        deceased age 40   Heart disease Sister        deceased MI age 62   Diabetes Sister    Heart disease Sister    Hypertension Sister    Hyperlipidemia Sister    Diabetes Sister    Heart disease Sister    Hypertension Sister    Hyperlipidemia Sister    Heart disease Brother        deceased MI age 22   Intellectual disability Brother    Diabetes Brother    Heart disease Brother    Hypertension Brother    Hyperlipidemia Brother    Diabetes Maternal Grandmother    Hypertension Paternal Grandmother    Colon cancer Neg Hx    Esophageal cancer Neg Hx    Stomach cancer Neg Hx    Rectal cancer Neg Hx     Social History   Socioeconomic History   Marital status: Widowed    Spouse name: Not on file   Number of children: 0   Years of education: college   Highest education  level: Not on file  Occupational History   Occupation: retired  Tobacco Use   Smoking status: Never   Smokeless tobacco: Never   Tobacco comments:    Never used tobacco  Vaping Use   Vaping status: Never Used  Substance and Sexual Activity   Alcohol use: No    Alcohol/week: 0.0 standard drinks of alcohol   Drug use: No   Sexual activity: Not Currently    Partners: Male    Birth control/protection: Post-menopausal    Comment: lives alone, no dietary restrictions except avoid fresh veg, fruit, whole grains  Other Topics Concern   Not on file  Social History Narrative   Patient was married ([name]) -  widow   Patient does not have any children.   Patient is right-handed.   Patient has a BA degree.   One caffeine drink daily    Social Drivers of Corporate investment banker Strain: Low Risk  (09/07/2022)   Overall Financial Resource Strain (CARDIA)    Difficulty of Paying Living Expenses: Not very hard  Food Insecurity: No Food Insecurity (06/30/2022)   Hunger Vital Sign    Worried About Running Out of Food in the Last Year: Never true    Ran Out of Food in the Last Year: Never true  Transportation Needs: No Transportation Needs (06/30/2022)   PRAPARE - Administrator, Civil Service (Medical): No    Lack of Transportation (Non-Medical): No  Recent Concern: Transportation Needs - Unmet Transportation Needs (06/30/2022)   PRAPARE - Administrator, Civil Service (Medical): Yes    Lack of Transportation (Non-Medical): No  Physical Activity: Inactive (09/07/2022)   Exercise Vital Sign    Days of Exercise per Week: 0 days    Minutes of Exercise per Session: 0 min  Stress: No Stress Concern Present (09/07/2022)   [employer] of Occupational Health - Occupational Stress Questionnaire    Feeling of Stress : Only a little  Recent Concern: Stress - Stress Concern Present (07/10/2022)   [employer] of Occupational Health - Occupational Stress Questionnaire     Feeling of Stress : To some extent  Social Connections: Moderately Isolated (09/07/2022)   Social Connection and Isolation Panel [NHANES]    Frequency of Communication with Friends and Family: More than three times a week    Frequency of Social Gatherings with Friends and Family: Once a week    Attends Religious Services: More than 4 times per year    Active Member of [employer] or Organizations: No    Attends Banker Meetings: Never    Marital Status: Widowed  Intimate Partner Violence: Not At Risk (06/30/2022)   Humiliation, Afraid, Rape, and Kick questionnaire    Fear of Current or Ex-Partner: No    Emotionally Abused: No    Physically Abused: No    Sexually Abused: No    Outpatient Medications Prior to Visit  Medication Sig Dispense Refill   acetaminophen (TYLENOL) 325 MG tablet Take 2 tablets (650 mg total) by mouth every 6 (six) hours as needed for up to 30 doses for mild pain or moderate pain. 30 tablet 0   amLODipine (NORVASC) 5 MG tablet TAKE 1 TABLET BY MOUTH DAILY 100 tablet 0   anastrozole (ARIMIDEX) 1 MG tablet Take 1 tablet (1 mg total) by mouth daily. 90 tablet 6   aspirin EC 81 MG tablet Take 81 mg by mouth daily. Swallow whole.     betamethasone valerate ointment (VALISONE) 0.1 % Apply a small amount to affected area topically every other day. 45 g 1   Blood Glucose Monitoring Suppl (ONE TOUCH ULTRA 2) w/Device KIT USE TO CHECK BLOOD SUGAR AS DIRECTED 1 kit 0   calcium-vitamin D (OSCAL WITH D) 500-5 MG-MCG tablet Take 1 tablet by mouth daily.     cholecalciferol (VITAMIN D) 1000 UNITS tablet Take 1,000 Units by mouth daily.     dicyclomine (BENTYL) 10 MG capsule Take 1 capsule (10 mg total) by mouth as needed for spasms. 60 capsule 0   esomeprazole (NEXIUM) 40 MG capsule TAKE 1 CAPSULE BY MOUTH DAILY AT 12 NOON 100 capsule 3   furosemide (LASIX) 20 MG tablet TAKE  1 TABLET BY MOUTH DAILY AS  NEEDED 100 tablet 2   hyoscyamine (LEVSIN SL) 0.125 MG SL tablet Place 1  tablet (0.125 mg total) under the tongue every 4 (four) hours as needed. 30 tablet 3   Lancets (ONETOUCH ULTRASOFT) lancets USE AS DIRECTED 3 TIMES  DAILY 300 each 3   losartan (COZAAR) 50 MG tablet TAKE 1 TABLET BY MOUTH TWICE  DAILY 200 tablet 2   metoCLOPramide (REGLAN) 5 MG tablet TAKE 1 TABLET BY MOUTH TWICE  DAILY AS NEEDED 180 tablet 1   metoprolol tartrate (LOPRESSOR) 50 MG tablet TAKE 1 TABLET BY MOUTH TWICE  DAILY 200 tablet 3   Multiple Vitamin (MULTIVITAMIN ADULT PO) Take by mouth.     ondansetron (ZOFRAN) 4 MG tablet Take 1 tablet (4 mg total) by mouth every 6 (six) hours as needed. 30 tablet 5   ONETOUCH ULTRA test strip CHECK BLOOD SUGAR 3 TIMES DAILY  OR AS NEEDED 300 strip 2   potassium chloride (KLOR-CON M) 10 MEQ tablet TAKE 1 TABLET BY MOUTH DAILY 100 tablet 2   rosuvastatin (CRESTOR) 20 MG tablet Take 1 tablet (20 mg total) by mouth daily. 90 tablet 1   thiamine (VITAMIN B-1) 100 MG tablet Take 100 mg by mouth every other day.     vitamin E 180 MG (400 UNITS) capsule Take 400 Units by mouth daily.     metFORMIN (GLUCOPHAGE-XR) 500 MG 24 hr tablet TAKE 1 TABLET BY MOUTH IN THE  MORNING AND 1 TABLET BY MOUTH AT BEDTIME 200 tablet 2   No facility-administered medications prior to visit.    Allergies  Allergen Reactions   Tramadol Other (See Comments)    Dizziness     Review of Systems  Constitutional:  Negative for fever and malaise/fatigue.  HENT:  Negative for congestion.   Eyes:  Negative for blurred vision.  Respiratory:  Negative for shortness of breath.   Cardiovascular:  Negative for chest pain, palpitations and leg swelling.  Gastrointestinal:  Negative for abdominal pain, blood in stool and nausea.  Genitourinary:  Negative for dysuria and frequency.  Musculoskeletal:  Negative for falls.  Skin:  Negative for rash.  Neurological:  Negative for dizziness, loss of consciousness and headaches.  Endo/Heme/Allergies:  Negative for environmental allergies.   Psychiatric/Behavioral:  Negative for depression. The patient is not nervous/anxious.        Objective:    Physical Exam Constitutional:      General: She is not in acute distress.    Appearance: Normal appearance. She is well-developed. She is not toxic-appearing.  HENT:     Head: Normocephalic and atraumatic.     Right Ear: External ear normal.     Left Ear: External ear normal.     Nose: Nose normal.  Eyes:     General:        Right eye: No discharge.        Left eye: No discharge.     Conjunctiva/sclera: Conjunctivae normal.  Neck:     Thyroid: No thyromegaly.  Cardiovascular:     Rate and Rhythm: Normal rate and regular rhythm.     Heart sounds: Normal heart sounds. No murmur heard. Pulmonary:     Effort: Pulmonary effort is normal. No respiratory distress.     Breath sounds: Normal breath sounds.  Abdominal:     General: Bowel sounds are normal.     Palpations: Abdomen is soft.     Tenderness: There is no abdominal tenderness. There  is no guarding.  Musculoskeletal:        General: Normal range of motion.     Cervical back: Neck supple.  Lymphadenopathy:     Cervical: No cervical adenopathy.  Skin:    General: Skin is warm and dry.  Neurological:     Mental Status: She is alert and oriented to person, place, and time.  Psychiatric:        Mood and Affect: Mood normal.        Behavior: Behavior normal.        Thought Content: Thought content normal.        Judgment: Judgment normal.     BP (!) 132/52 (BP Location: Left Arm, Patient Position: Sitting, Cuff Size: Normal)   Pulse 65   Resp 16   Ht 5\' 1"  (1.549 m)   Wt 165 lb 9.6 oz (75.1 kg)   LMP 02/07/1992   SpO2 100%   BMI 31.29 kg/m  Wt Readings from Last 3 Encounters:  04/23/23 165 lb 9.6 oz (75.1 kg)  03/09/23 163 lb (73.9 kg)  03/06/23 163 lb 12.8 oz (74.3 kg)    Diabetic Foot Exam - Simple   No data filed    Lab Results  Component Value Date   WBC 7.9 04/16/2023   HGB 12.6 04/16/2023    HCT 37.8 04/16/2023   PLT 219.0 04/16/2023   GLUCOSE 134 (H) 04/16/2023   CHOL 193 04/16/2023   TRIG 128.0 04/16/2023   HDL 67.70 04/16/2023   LDLDIRECT 95.0 03/25/2020   LDLCALC 100 (H) 04/16/2023   ALT 18 04/16/2023   AST 18 04/16/2023   NA 134 (L) 04/16/2023   K 4.5 04/16/2023   CL 96 04/16/2023   CREATININE 0.83 04/16/2023   BUN 25 (H) 04/16/2023   CO2 28 04/16/2023   TSH 2.37 04/16/2023   INR 0.88 01/16/2011   HGBA1C 6.7 (H) 04/16/2023   MICROALBUR 10.2 (H) 01/12/2023    Lab Results  Component Value Date   TSH 2.37 04/16/2023   Lab Results  Component Value Date   WBC 7.9 04/16/2023   HGB 12.6 04/16/2023   HCT 37.8 04/16/2023   MCV 84.9 04/16/2023   PLT 219.0 04/16/2023   Lab Results  Component Value Date   NA 134 (L) 04/16/2023   K 4.5 04/16/2023   CHLORIDE 101 07/08/2015   CO2 28 04/16/2023   GLUCOSE 134 (H) 04/16/2023   BUN 25 (H) 04/16/2023   CREATININE 0.83 04/16/2023   BILITOT 0.6 04/16/2023   ALKPHOS 62 04/16/2023   AST 18 04/16/2023   ALT 18 04/16/2023   PROT 7.1 04/16/2023   ALBUMIN 4.5 04/16/2023   CALCIUM 9.9 04/16/2023   ANIONGAP 10 03/06/2023   EGFR 75 (L) 07/08/2015   GFR 65.83 04/16/2023   Lab Results  Component Value Date   CHOL 193 04/16/2023   Lab Results  Component Value Date   HDL 67.70 04/16/2023   Lab Results  Component Value Date   LDLCALC 100 (H) 04/16/2023   Lab Results  Component Value Date   TRIG 128.0 04/16/2023   Lab Results  Component Value Date   CHOLHDL 3 04/16/2023   Lab Results  Component Value Date   HGBA1C 6.7 (H) 04/16/2023       Assessment & Plan:  Gastroesophageal reflux disease, unspecified whether esophagitis present Assessment & Plan: Avoid offending foods, start probiotics. Do not eat large meals in late evening and consider raising head of bed. She is feeling much better,  is on the Nexium in the morning and is not taking the Famotidine and still feels well. Use Famotidine prn if  symptoms return. Continue Nexium daily. She will follow up with gastroenterology if symptoms return.    Essential hypertension Assessment & Plan: Well controlled, no changes to meds. Encouraged heart healthy diet such as the DASH diet and exercise as tolerated.    Orders: -     CBC with Differential/Platelet; Future -     TSH; Future  Iron deficiency anemia due to chronic blood loss Assessment & Plan: Increase leafy greens, consider increased lean red meat and using cast iron cookware. Continue to monitor, report any concerns    HYPERCHOLESTEROLEMIA Assessment & Plan: Encourage heart healthy diet such as MIND or DASH diet, increase exercise, avoid trans fats, simple carbohydrates and processed foods, consider a krill or fish or flaxseed oil cap daily. Tolerating Rosuvastatin  Orders: -     Lipid panel; Future -     Comprehensive metabolic panel; Future  Type 2 diabetes mellitus with obesity (HCC) Assessment & Plan: hgba1c acceptable, minimize simple carbs. Increase exercise as tolerated. Continue current meds  Orders: -     Hemoglobin A1c; Future  Vitamin D deficiency Assessment & Plan: Supplement and monitor   Orders: -     VITAMIN D 25 Hydroxy (Vit-D Deficiency, Fractures); Future  Nonintractable headache, unspecified chronicity pattern, unspecified headache type -     Sedimentation rate  Diabetic peripheral neuropathy (HCC) Assessment & Plan: hgba1c acceptable, minimize simple carbs. Increase exercise as tolerated. Continue current meds   Orders: -     Microalbumin / creatinine urine ratio  Other orders -     metFORMIN HCl ER; Take 1 tablet (500 mg total) by mouth daily. -     Famotidine; Take 1 tablet (40 mg total) by mouth daily.  Dispense: 30 tablet; Refill: 4    Assessment and Plan Assessment & Plan Type 2 Diabetes Mellitus Blood glucose fluctuating with morning hypoglycemia. Hemoglobin A1c at 6.7%. - Reduce metformin to 500 mg once daily with  dinner. - Monitor blood glucose levels and report if consistently over 150 mg/dL. - Encourage higher protein, lower carbohydrate diet. - Re-evaluate blood glucose in 3 months.  Breast Cancer Severe abdominal pain resolved after switching from letrozole to anastrozole. Anastrozole causing sleep disturbances, no abdominal pain. Monitoring for effects on diverticulitis. - Continue anastrozole and monitor for adverse effects. - Discuss new symptoms or concerns with oncologist.  Hyponatremia Mild hyponatremia with sodium at 134 mEq/L, not causing significant concern. - Encourage moderate sodium intake in diet.     Danise Edge, MD

## 2023-04-23 NOTE — Patient Instructions (Signed)
 Hypertension, Adult High blood pressure (hypertension) is when the force of blood pumping through the arteries is too strong. The arteries are the blood vessels that carry blood from the heart throughout the body. Hypertension forces the heart to work harder to pump blood and may cause arteries to become narrow or stiff. Untreated or uncontrolled hypertension can lead to a heart attack, heart failure, a stroke, kidney disease, and other problems. A blood pressure reading consists of a higher number over a lower number. Ideally, your blood pressure should be below 120/80. The first ("top") number is called the systolic pressure. It is a measure of the pressure in your arteries as your heart beats. The second ("bottom") number is called the diastolic pressure. It is a measure of the pressure in your arteries as the heart relaxes. What are the causes? The exact cause of this condition is not known. There are some conditions that result in high blood pressure. What increases the risk? Certain factors may make you more likely to develop high blood pressure. Some of these risk factors are under your control, including: Smoking. Not getting enough exercise or physical activity. Being overweight. Having too much fat, sugar, calories, or salt (sodium) in your diet. Drinking too much alcohol. Other risk factors include: Having a personal history of heart disease, diabetes, high cholesterol, or kidney disease. Stress. Having a family history of high blood pressure and high cholesterol. Having obstructive sleep apnea. Age. The risk increases with age. What are the signs or symptoms? High blood pressure may not cause symptoms. Very high blood pressure (hypertensive crisis) may cause: Headache. Fast or irregular heartbeats (palpitations). Shortness of breath. Nosebleed. Nausea and vomiting. Vision changes. Severe chest pain, dizziness, and seizures. How is this diagnosed? This condition is diagnosed by  measuring your blood pressure while you are seated, with your arm resting on a flat surface, your legs uncrossed, and your feet flat on the floor. The cuff of the blood pressure monitor will be placed directly against the skin of your upper arm at the level of your heart. Blood pressure should be measured at least twice using the same arm. Certain conditions can cause a difference in blood pressure between your right and left arms. If you have a high blood pressure reading during one visit or you have normal blood pressure with other risk factors, you may be asked to: Return on a different day to have your blood pressure checked again. Monitor your blood pressure at home for 1 week or longer. If you are diagnosed with hypertension, you may have other blood or imaging tests to help your health care provider understand your overall risk for other conditions. How is this treated? This condition is treated by making healthy lifestyle changes, such as eating healthy foods, exercising more, and reducing your alcohol intake. You may be referred for counseling on a healthy diet and physical activity. Your health care provider may prescribe medicine if lifestyle changes are not enough to get your blood pressure under control and if: Your systolic blood pressure is above 130. Your diastolic blood pressure is above 80. Your personal target blood pressure may vary depending on your medical conditions, your age, and other factors. Follow these instructions at home: Eating and drinking  Eat a diet that is high in fiber and potassium, and low in sodium, added sugar, and fat. An example of this eating plan is called the DASH diet. DASH stands for Dietary Approaches to Stop Hypertension. To eat this way: Eat  plenty of fresh fruits and vegetables. Try to fill one half of your plate at each meal with fruits and vegetables. Eat whole grains, such as whole-wheat pasta, brown rice, or whole-grain bread. Fill about one  fourth of your plate with whole grains. Eat or drink low-fat dairy products, such as skim milk or low-fat yogurt. Avoid fatty cuts of meat, processed or cured meats, and poultry with skin. Fill about one fourth of your plate with lean proteins, such as fish, chicken without skin, beans, eggs, or tofu. Avoid pre-made and processed foods. These tend to be higher in sodium, added sugar, and fat. Reduce your daily sodium intake. Many people with hypertension should eat less than 1,500 mg of sodium a day. Do not drink alcohol if: Your health care provider tells you not to drink. You are pregnant, may be pregnant, or are planning to become pregnant. If you drink alcohol: Limit how much you have to: 0-1 drink a day for women. 0-2 drinks a day for men. Know how much alcohol is in your drink. In the [place] one drink equals one 12 oz bottle of beer (355 mL), one 5 oz glass of wine (148 mL), or one 1 oz glass of hard liquor (44 mL). Lifestyle  Work with your health care provider to maintain a healthy body weight or to lose weight. Ask what an ideal weight is for you. Get at least 30 minutes of exercise that causes your heart to beat faster (aerobic exercise) most days of the week. Activities may include walking, swimming, or biking. Include exercise to strengthen your muscles (resistance exercise), such as Pilates or lifting weights, as part of your weekly exercise routine. Try to do these types of exercises for 30 minutes at least 3 days a week. Do not use any products that contain nicotine or tobacco. These products include cigarettes, chewing tobacco, and vaping devices, such as e-cigarettes. If you need help quitting, ask your health care provider. Monitor your blood pressure at home as told by your health care provider. Keep all follow-up visits. This is important. Medicines Take over-the-counter and prescription medicines only as told by your health care provider. Follow directions carefully. Blood  pressure medicines must be taken as prescribed. Do not skip doses of blood pressure medicine. Doing this puts you at risk for problems and can make the medicine less effective. Ask your health care provider about side effects or reactions to medicines that you should watch for. Contact a health care provider if you: Think you are having a reaction to a medicine you are taking. Have headaches that keep coming back (recurring). Feel dizzy. Have swelling in your ankles. Have trouble with your vision. Get help right away if you: Develop a severe headache or confusion. Have unusual weakness or numbness. Feel faint. Have severe pain in your chest or abdomen. Vomit repeatedly. Have trouble breathing. These symptoms may be an emergency. Get help right away. Call 911. Do not wait to see if the symptoms will go away. Do not drive yourself to the hospital. Summary Hypertension is when the force of blood pumping through your arteries is too strong. If this condition is not controlled, it may put you at risk for serious complications. Your personal target blood pressure may vary depending on your medical conditions, your age, and other factors. For most people, a normal blood pressure is less than 120/80. Hypertension is treated with lifestyle changes, medicines, or a combination of both. Lifestyle changes include losing weight, eating a healthy,  low-sodium diet, exercising more, and limiting alcohol. This information is not intended to replace advice given to you by your health care provider. Make sure you discuss any questions you have with your health care provider. Document Revised: 11/30/2020 Document Reviewed: 11/30/2020 [employer] Patient Education  2024 [employer].

## 2023-04-24 ENCOUNTER — Encounter: Payer: Self-pay | Admitting: Family Medicine

## 2023-04-24 ENCOUNTER — Ambulatory Visit: Payer: Medicare Other | Admitting: Physical Therapy

## 2023-04-24 ENCOUNTER — Encounter: Payer: Self-pay | Admitting: Physical Therapy

## 2023-04-24 DIAGNOSIS — M25552 Pain in left hip: Secondary | ICD-10-CM | POA: Diagnosis not present

## 2023-04-24 DIAGNOSIS — R2681 Unsteadiness on feet: Secondary | ICD-10-CM | POA: Diagnosis not present

## 2023-04-24 DIAGNOSIS — M6281 Muscle weakness (generalized): Secondary | ICD-10-CM

## 2023-04-24 DIAGNOSIS — R2689 Other abnormalities of gait and mobility: Secondary | ICD-10-CM | POA: Diagnosis not present

## 2023-04-24 LAB — MICROALBUMIN / CREATININE URINE RATIO
Creatinine,U: 24.9 mg/dL
Microalb Creat Ratio: 108.2 mg/g — ABNORMAL HIGH (ref 0.0–30.0)
Microalb, Ur: 2.7 mg/dL — ABNORMAL HIGH (ref 0.0–1.9)

## 2023-04-24 LAB — SEDIMENTATION RATE: Sed Rate: 10 mm/h (ref 0–30)

## 2023-04-24 NOTE — Therapy (Signed)
 OUTPATIENT PHYSICAL THERAPY TREATMENT   Patient Name: [name] MRN: [id number] DOB:24-Mar-1940, 83 y.o., female Today's Date: 04/24/2023   END OF SESSION:  PT End of Session - 04/24/23 1400     Visit Number 3    Date for PT Re-Evaluation 05/29/23    Authorization Type [employer] Medicare    Authorization Time Period 04/03/23 - 05/29/23    Authorization - Visit Number 3    Authorization - Number of Visits 6    PT Start Time 1400    PT Stop Time 1446    PT Time Calculation (min) 46 min    Activity Tolerance Patient tolerated treatment well;Patient limited by fatigue    Behavior During Therapy [facility] for tasks assessed/performed              Past Medical History:  Diagnosis Date   Abdominal pain in female 03/18/2010   Qualifier: Diagnosis of  By: Leone Payor MD, Alfonse Ras E    Anemia 06/08/2014   Anxiety    Arthritis    Spinal Osteoarthritis   Breast cancer (HCC)    Carcinoid tumor of stomach    Cataract    Chest pain    Myoview 12/15 no ischemia.   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 09/05/2006   Qualifier: Diagnosis of  By: Charlsie Quest RMA, Lucy     Diabetic peripheral neuropathy (HCC) 10/29/2013   Encounter for preventative adult health care exam with abnormal findings 09/14/2013   Esophageal reflux    Gastric polyp    Fundic Gland   Gastroparesis    Headache(784.0)    Heart murmur    Echocardiogram 2/11: EF 60-65%, mild LAE, grade 1 diastolic dysfunction, aortic valve sclerosis, mean gradient 9 mm of mercury, PASP 34   Hematuria 03/16/2016   Iron deficiency anemia, unspecified    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   Leg swelling    bilateral   Neck pain 04/22/2015   PONV (postoperative nausea and vomiting)    pt states body temperature drops every time she has anesthesia; pt states only needs small amount of anesthesia   PSVT (paroxysmal supraventricular tachycardia) (HCC)     Pure hypercholesterolemia    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   Renal insufficiency 06/18/2019   pt states  L kidney function very low-functions at about 20%   Stroke [facility])    tia, 2014   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   Type II or unspecified type diabetes mellitus without mention of complication, not stated as uncontrolled    Unspecified essential hypertension    Unspecified hereditary and idiopathic peripheral neuropathy 10/29/2013   Vitamin D deficiency    Past Surgical History:  Procedure Laterality Date   BREAST BIOPSY Right 06/13/2022   [place] RT BREAST BX W LOC DEV 1ST LESION IMG BX SPEC US GUIDE 06/13/2022 [facility]   BREAST LUMPECTOMY Right 07/31/2022   Procedure: RIGHT BREAST LUMPECTOMY;  Surgeon: Abigail Miyamoto, MD;  Location: [facility];  Service: General;  Laterality: Right;   CHOLECYSTECTOMY  1993   COLONOSCOPY  11/11/2010   diverticulosis   DILATATION & CURRETTAGE/HYSTEROSCOPY WITH RESECTOCOPE N/A 02/25/2013   Procedure: Attempted hysteroscopy with uterine perforation;  Surgeon: Jacqualin Combes de Gwenevere Ghazi, MD;  Location: [facility];  Service: Gynecology;  Laterality: N/A;   ESOPHAGOGASTRODUODENOSCOPY  08/29/2010; 09/15/2010   Carcinoid tumor less than 1 cm in July 2012 not seen in August 2012 ,  gastritis, fundic gland polyps   ESOPHAGOGASTRODUODENOSCOPY  05/16/2011   ESOPHAGOGASTRODUODENOSCOPY  06/14/2012   EUS  12/15/2010   Procedure: UPPER ENDOSCOPIC ULTRASOUND (EUS) LINEAR;  Surgeon: Rob Bunting, MD;  Location: [facility];  Service: Endoscopy;  Laterality: N/A;   EYE SURGERY Bilateral    Bi lateral cateracts and bi lateral laser   LAPAROSCOPY N/A 02/25/2013   Procedure: Cystoscopy and laparoscopy with fulguration of uterine serosa;  Surgeon: Jacqualin Combes de Gwenevere Ghazi, MD;  Location: [facility];  Service: Gynecology;  Laterality: N/A;   TONSILLECTOMY     Patient Active Problem List   Diagnosis Date Noted   Precordial chest pain 12/24/2022   Carcinoma of  breast upper outer quadrant, right (HCC) 07/04/2022   SVT (supraventricular tachycardia) (HCC) 06/21/2022   Left-sided weakness 04/02/2022   Unsteady gait 04/02/2022   Oral lesion 03/08/2021   Sun-damaged skin 03/08/2021   Thiamine deficiency 11/01/2020   Trigeminal neuralgia 08/21/2020   Pedal edema 08/21/2020   Chronic left-sided back pain 03/28/2020   Nocturia 03/28/2020   Left-sided headache 03/28/2020   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   Renal insufficiency 06/18/2019   Educated about COVID-19 virus infection 05/15/2019   Atrophic vaginitis 01/20/2019   Stenosis of carotid artery 11/12/2018   Anxiety 10/13/2018   Referred otalgia of left ear 04/23/2018   Chronic throat clearing 04/23/2018   Sinusitis 10/11/2017   Headache 07/12/2017   Dizzy spells 11/21/2016   Constipation 11/21/2016   Nonrheumatic aortic valve stenosis 05/23/2016   Aortic atherosclerosis (HCC) 05/23/2016   Hematuria 03/16/2016   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   Pain of upper abdomen 06/06/2015   Neck pain 04/22/2015   Ear pain 02/28/2015   Abnormal urine 11/21/2014   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   Anemia 06/08/2014   RLS (restless legs syndrome) 11/23/2013   Diabetic peripheral neuropathy (HCC) 10/29/2013   Left-sided thoracic back pain 10/06/2013   Encounter for preventative adult health care exam with abnormal findings 09/14/2013   Iron deficiency anemia    Status post laparoscopy 02/25/2013   Hyponatremia 01/09/2013   GERD (gastroesophageal reflux disease) 01/09/2013   Amaurosis fugax of left eye 10/16/2012   Low back pain 06/03/2012   Vitamin D deficiency 03/11/2012   Bilateral hand pain 10/20/2011   Encounter for long-term (current) use of other medications 10/20/2011   IBS (irritable bowel syndrome) 08/14/2011   TIA (transient ischemic attack) 02/10/2011   Abnormal brain CT 01/19/2011   Allergic rhinitis 10/01/2010   Carcinoid tumor of stomach- history of  09/29/2010   Preventative health care 07/15/2010   FUNDIC GLAND POLYPS OF STOMACH 03/18/2010   Abdominal pain in female 03/18/2010   Left hip pain 03/17/2009   SYSTOLIC MURMUR 03/02/2009   Paroxysmal supraventricular tachycardia (HCC) 01/12/2009   PLANTAR FASCIITIS 06/08/2008   CHEST PAIN 05/18/2008   Gastroparesis 12/18/2007   HYPERCHOLESTEROLEMIA 06/11/2007   Type 2 diabetes mellitus with obesity (HCC) 09/05/2006   Essential hypertension 09/05/2006    PCP: [name], MD   REFERRING PROVIDER: [name], MD   REFERRING DIAG:  M79.10 (ICD-10-CM) - Myalgia  R26.81 (ICD-10-CM) - Unsteady gait  R53.1 (ICD-10-CM) - Weakness   THERAPY DIAG:  Muscle weakness (generalized)  Other abnormalities of gait and mobility  Unsteadiness on feet  Pain in left hip  RATIONALE FOR EVALUATION AND TREATMENT: Rehabilitation  ONSET DATE: Worsening over the past year, especially since breast cancer diagnosis and May 2024  NEXT MD VISIT: 04/23/2023  SUBJECTIVE:                                                                                                                                                                                                         SUBJECTIVE STATEMENT: Pt reports ongoing dizziness.  She reports her PCP told her she needs to talk to her cardiologist.   EVAL: Pt reports her weakness and pains seems to be exacerbated by recent meds she has had to take. She notes difficulty climbing stairs and does not feel safe using a step stool to reach high things.  She feels like she has no energy.  She reports chronic belly pain, maybe from meds vs diverticulitis.  L hip pain has been an issue for several months but patient has been told there is no "bony" reason for her pain (x-rays negative).  PAIN: Are you having pain? Yes: NPRS scale: 5/10  Pain location: L lateral hip Pain description: dull, sharp when more intense  Aggravating factors: lying on side to sleep  (worse when lying on L than R), first moving in the morning Relieving factors: walking, Tylenol   Are you having pain? Yes: NPRS scale: 5/10 Pain location: B upper shoulders Pain description: pinching Aggravating factors: uncertain Relieving factors: keep moving  PERTINENT HISTORY:  Extensive PMH including chronic back and neck pain, OA, osteoporosis, DM, HTN, h/o TIA, diabetic peripheral neuropathy, PVD, headaches, sleep dysfunction, anxiety, R breast cancer s/p lumpectomy 07/31/22, SVT (refer to above problem list and past medical/surgical history for full PMH)   PRECAUTIONS: Fall  RED FLAGS: None  WEIGHT BEARING RESTRICTIONS: No  FALLS:  Has patient fallen in last 6 months? No - admits to a few close calls due to dizziness/lightheadness  LIVING ENVIRONMENT: Lives with: lives alone  Lives in: House/apartment Stairs: No Has following equipment at home: Single point cane, Environmental consultant - 4 wheeled, shower chair, and Grab bars  OCCUPATION: Retired  PLOF: Independent, Needs assistance with homemaking, Leisure: read and play card games on her phone, traveling, and housekeeping/cleaning service ~1x/month    PATIENT GOALS: "To be able to walk steady and feel comfortable going up stairs so I can visit my niece and nephew. To be able to carry groceries."   OBJECTIVE: (objective measures completed at initial evaluation unless otherwise dated)  DIAGNOSTIC FINDINGS:  01/22/23 & 10/05/22 - DH Left hip (2 several months of L hip pain) IMPRESSION: Negative.  COGNITION: Overall cognitive status: Within functional limits for tasks assessed   SENSATION: Numbness and tingling in hands and feet from DM and cervical radiculopathy  POSTURE:  rounded shoulders, forward head, increased thoracic kyphosis, and flexed trunk   PALPATION: Increased TTP over L TFL, hip flexors and abductors  UPPER EXTREMITY MMT:  MMT Right eval Left eval  Shoulder flexion 4- 4-  Shoulder extension 3+ 3+   Shoulder abduction 4- 4-  Shoulder adduction    Shoulder internal rotation 4- 4-  Shoulder external rotation 3+ 3+  Middle trapezius    Lower trapezius     (Blank rows = not tested)  MUSCLE LENGTH: Hamstrings: mod tight B ITB: mod tight B  Piriformis: severe tight B Hip flexors: mild/mod tight L>R  Quads: mild/mod tight L>R  Heelcord: mod tight B  LOWER EXTREMITY ROM:    Mildly limited primarily due to muscle tightness  LOWER EXTREMITY MMT:  (tested in sitting on eval)  MMT Right eval Left eval  Hip flexion 3+ 3+  Hip extension 3- 3-  Hip abduction 3+ 3+  Hip adduction 3+ 3+  Hip internal rotation 3- 3-  Hip external rotation 3- 3-  Knee flexion 4- 4-  Knee extension 4- 4  Ankle dorsiflexion 3+ 4-  Ankle plantarflexion    Ankle inversion    Ankle eversion    (Blank rows = not tested)  BED MOBILITY:  Sit to supine Min A Supine to sit Min A Rolling to Right SBA  TRANSFERS: Assistive device utilized: None  Sit to stand: Complete Independence Stand to sit: Complete Independence Chair to chair: Modified independence Floor:  NT  GAIT: Distance walked: Clinic distances Assistive device utilized: None Level of assistance: Complete Independence and SBA Gait pattern: decreased stride length and trunk flexed Comments: Decreased gait speed  RAMP: Level of Assistance:  NT Assistive device utilized:    Ramp Comments:   CURB:  Level of Assistance:  NT Assistive device utilized:    Curb Comments:   STAIRS: Level of Assistance: SBA Stair Negotiation Technique: Step to Pattern, Sideways (descent), Forwards (ascent) with Single Rail on Right Number of Stairs: 14  Height of Stairs: 7"  Comments: forward for ascent, sideways leading with R foot for descent    FUNCTIONAL TESTS: (TBA next visit) 5 times sit to stand: 30.43 sec; >15 sec indicates a risk for recurrent falls  Timed up and go (TUG): 16.03 sec; >13.5 sec indicates a high risk for falls  10 meter walk  test: 13.91 sec ; Gait speed = 2.36 ft/sec; >/=2.62 ft/sec is necessary to be considered a Materials engineer Scale: 44/56; Scores of 37-45 indicate significant risk for falls (>80%)  Dynamic Gait Index: 15/24; Scores of 19 or less are predictive of falls in older community living adults.    PATIENT SURVEYS:  ABC scale 580 / 1600 = 36.3 % , scores of <50% indicating a low level of physical functioning and scores of <67% indicating a risk for falls in patients with vestibular disorders   TODAY'S TREATMENT:   04/24/2023  THERAPEUTIC EXERCISE: To improve strength, endurance, ROM, and flexibility.  Demonstration, verbal and tactile cues throughout for technique.  NuStep - L3 x 6 min (UE/LE - seat #6, handles #7) [place] Fall Prevention Program: Head movements (rotation) x 5 Neck movements (chin tucks x 5) Back Extension x 5 (against counter for safety) -Cue to use counter for stability to prevent posterior loss of balance Trunk movements (trunk rotation with hands on hips) x 5 each side Ankle movements (seated ankle pumps) x 10 each foot Strengthening: Front knee strengthening (seated LAQ)  2 x  10 Back knee strengthening (standing hamstring curls) x 10 bilateral (B UE support on counter) Side hip strengthening (hip abduction) x 10 bilateral (single UE support on counter) Calf raises x 10 (hold support)  Toe raises x 10 (hold support) Balance: Knee bends x 10 (hold support)  SELF CARE: Provided education to reduce fall risk and to promote safe home environment.  Reviewed the "Check for Safety - Home Fall Prevention Checklist for Older Adults" to help identify fall risk hazards in the home along with strategies to reduce fall risk at home    04/09/2023  PHYSICAL PERFORMANCE TEST or MEASUREMENT: Orthostatic BP assessment - SBP drop of >/= 20 points with associated dizziness/lightheadedness is indicative of postural/orthostatic hypotension  Orthostatics  BP supine (x 5 minutes)  130/60   HR supine (x 5 minutes) 65   BP sitting 114/54   HR sitting 68   BP standing (after 1 minute) 110/58   HR standing (after 1 minute) 64   BP standing (after 3 minutes) 120/58   HR standing (after 3 minutes) 62   Orthostatics Comment dizziness and lightheadedness reported with transitions supine to sit and sit to stand      5xSTS = 30.43 sec; ; >15 sec indicates a risk for recurrent falls TUG: 16.03 sec; >13.5 sec indicates a high risk for falls : 13.91 sec  Gait speed = 2.36 ft/sec; >/=2.62 ft/sec is necessary to be considered a Chief Operating Officer Scale: 44/56; 37-45 significant risk for falls (>80%)  Dynamic Gait Index: 15/24; Scores of 19 or less are predictive of falls in older community living adults.   Standardized Balance Assessment   Standardized Balance Assessment Berg Balance Test;Dynamic Gait Index      Berg Balance Test   Sit to Stand Able to stand without using hands and stabilize independently    Standing Unsupported Able to stand safely 2 minutes    Sitting with Back Unsupported but Feet Supported on Floor or Stool Able to sit safely and securely 2 minutes    Stand to Sit Sits safely with minimal use of hands    Transfers Able to transfer safely, minor use of hands    Standing Unsupported with Eyes Closed Able to stand 10 seconds safely    Standing Unsupported with Feet Together Able to place feet together independently and stand for 1 minute with supervision    From Standing, Reach Forward with Outstretched Arm Can reach forward >12 cm safely (5")    From Standing Position, Pick up Object from Floor Able to pick up shoe, needs supervision    From Standing Position, Turn to Look Behind Over each Shoulder Needs supervision when turning    Turn 360 Degrees Able to turn 360 degrees safely but slowly    Standing Unsupported, Alternately Place Feet on Step/Stool Able to stand independently and complete 8 steps >20 seconds    Standing Unsupported,  One Foot in Front Able to plae foot ahead of the other independently and hold 30 seconds    Standing on One Leg Able to lift leg independently and hold equal to or more than 3 seconds    Total Score 44    Berg comment: 37-45 significant (>80%)      Dynamic Gait Index   Level Surface Mild Impairment    Change in Gait Speed Severe Impairment    Gait with Horizontal Head Turns Normal    Gait with Vertical Head Turns Mild Impairment    Gait and Pivot Turn  Mild Impairment    Step Over Obstacle Mild Impairment    Step Around Obstacles Normal    Steps Moderate Impairment    Total Score 15    DGI comment: Scores of 19 or less are predictive of falls in older community living adults.       04/03/2023 - Eval SELF CARE:  Reviewed eval findings and role of PT in addressing identified deficits as well as need for further testing to address possibility of orthostatic hypotension as well as determine fall risk.  Provided education on managing dizziness with transitional motions, making sure to pause long enough for dizziness to resolve before continuing with mobility.   PATIENT EDUCATION:  Education details: Check for Safety - Home Fall Prevention Checklist for Older Adults  and [employer] Program  Person educated: Patient Education method: Explanation, Demonstration, Verbal cues, and Handouts Education comprehension: verbalized understanding, returned demonstration, verbal cues required, and needs further education  HOME EXERCISE PROGRAM: Access Code: [email or web address] URL: [email or web address] Date: 04/24/2023 Prepared by: Glenetta Hew  Patient Education - Check for Safety - [place] Program   ASSESSMENT:  CLINICAL IMPRESSION: [name] reports she is still experiencing dizziness but states her PCP wants her to follow-up with her cardiologist regarding her BP issues.  Reviewed orthostatic BP precautions with positional changes, with patient verbalizing understanding.   Reviewed the "Check for Safety - Home Fall Prevention Checklist for Older Adults" to help identify fall risk hazards in the home along with strategies to reduce fall risk at home, with patient reporting majority of items not an issue in her home as changes were made when her husband was still alive although she does admit to throw rugs on top of carpets.  Explained fall risk related to tripping hazard from throw rugs with patient verbalizing understanding.  Initiated training in HEP utilizing [place] fall prevention program with items identified for home performance highlighted in booklet.  Will plan to complete instruction in remainder of [place] next visit and review any questions or concerns from initial portion as needed.  Kierrah will benefit from continued skilled PT to address ongoing deficits and impairments to improve mobility and activity tolerance to help reach the maximal level of functional independence and mobility.    OBJECTIVE IMPAIRMENTS: Abnormal gait, decreased activity tolerance, decreased balance, decreased coordination, decreased endurance, decreased knowledge of condition, decreased knowledge of use of DME, decreased mobility, difficulty walking, decreased ROM, decreased strength, decreased safety awareness, dizziness, increased fascial restrictions, impaired perceived functional ability, increased muscle spasms, impaired flexibility, impaired sensation, impaired UE functional use, improper body mechanics, postural dysfunction, and pain.   ACTIVITY LIMITATIONS: carrying, lifting, bending, standing, squatting, sleeping, stairs, transfers, bed mobility, and locomotion level  PARTICIPATION LIMITATIONS: meal prep, cleaning, laundry, driving, shopping, community activity, and church  PERSONAL FACTORS: Age, Fitness, Past/current experiences, Social background, Time since onset of injury/illness/exacerbation, and 3+ comorbidities: Extensive PMH including chronic back and neck pain, OA,  osteoporosis, DM, HTN, h/o TIA, diabetic peripheral neuropathy, PVD, headaches, sleep dysfunction, anxiety, R breast cancer s/p lumpectomy 07/31/22, SVT (refer to above problem list and past medical/surgical history for full PMH)   are also affecting patient's functional outcome.   REHAB POTENTIAL: Good  CLINICAL DECISION MAKING: Unstable/unpredictable  EVALUATION COMPLEXITY: High   GOALS: Goals reviewed with patient? Yes  SHORT TERM GOALS: Target date: 05/01/2023  Patient will be independent with initial HEP to improve outcomes and carryover.  Baseline:  Goal status: IN PROGRESS  2.  Patient will be educated on strategies to  decrease risk of falls.  Baseline:  Goal status: IN PROGRESS  3.  Patient will demonstrate decreased TUG time to </= 13.5 sec to decrease risk for falls with transitional mobility. Baseline:  Goal status: IN PROGRESS  LONG TERM GOALS: Target date: 05/29/2023  Patient will be independent with advanced/ongoing HEP to facilitate ability to maintain/progress functional gains from skilled physical therapy services. Baseline:  Goal status: IN PROGRESS  2.  Patient will be able to ambulate 600' with or w/o LRAD on variable surfaces with good safety to access community.  Baseline:  Goal status: IN PROGRESS  3.  Patient will be able to step up/down curb safely with or w/o LRAD for safety with community ambulation.  Baseline:  Goal status: IN PROGRESS   4.  Patient will demonstrate improved B LE strength to >/= 4/5 for improved stability and ease of mobility. Baseline: Refer to above LE MMT table Goal status: IN PROGRESS  5.  Patient will improve 5x STS time to </= 15 seconds for improved efficiency and safety with transfers. Baseline: 30.43 sec (04/09/23) Goal status: IN PROGRESS   6.  Patient will demonstrate gait speed of >/= 2.62 ft/sec (0.8 m/s) to be a safe community ambulator with decreased risk for recurrent falls.  Baseline: 2.36 ft/sec (04/09/23) Goal  status: IN PROGRESS  7.  Patient will improve Berg score by at least 8 points to improve safety and stability with ADLs in standing and reduce risk for falls.  Baseline: 44/56 (04/09/23) Goal status: IN PROGRESS  8.  Patient will demonstrate at least 19/24 on DGI or 19/30 on FGA to improve gait stability and reduce risk for falls. Baseline: 15/24 (04/09/23) Goal status: IN PROGRESS  9. Patient will report >/= 50% on ABC scale to demonstrate improved balance confidence with functional mobility and gait. Baseline: 580 / 1600 = 36.3 % Goal status: IN PROGRESS   PLAN:  PT FREQUENCY: 2x/week  PT DURATION: 8 weeks  PLANNED INTERVENTIONS: 97164- PT Re-evaluation, 97110-Therapeutic exercises, 97530- Therapeutic activity, 97112- Neuromuscular re-education, 97535- Self Care, 36644- Manual therapy, [phone]- Gait training, [phone]- Canalith repositioning, [id number]- Electrical stimulation (unattended), 97035- Ultrasound, [id number]- Ionotophoresis 4mg /ml Dexamethasone, Patient/Family education, Balance training, Stair training, Taping, Dry Needling, Joint mobilization, Spinal mobilization, Vestibular training, Cryotherapy, Moist heat, and 97750- Physical performance test or measurement  PLAN FOR NEXT SESSION: Review initial portion of [place] fall prevention program and complete training in balance component identifying activities appropriate for HEP accordingly   Marry Guan, PT 04/24/2023, 2:07 PM    Date of referral: 01/16/2023 Referring provider: [name], MD Referring diagnosis?  M79.10 (ICD-10-CM) - Myalgia  R26.81 (ICD-10-CM) - Unsteady gait  R53.1 (ICD-10-CM) - Weakness  Treatment diagnosis? (if different than referring diagnosis)  Muscle weakness (generalized)  Other abnormalities of gait and mobility  Unsteadiness on feet  Pain in left hip  What was this (referring dx) caused by? Ongoing Issue and Other: Sequelae of treatments for breast cancer  Nature of Condition: Chronic  (continuous duration > 3 months)   Laterality: Lt  Current Functional Measure Score: Other ABC scale = 580 / 1600 = 36.3 %  Objective measurements identify impairments when they are compared to normal values, the uninvolved extremity, and prior level of function.  [x]  Yes  []  No  Objective assessment of functional ability: Severe functional limitations   Briefly describe symptoms: Patient presents with physical impairments of L anterior lateral hip pain, impaired activity tolerance, global UE/LE weakness, dizziness, impaired standing balance, impaired ambulation,  inability to navigate stairs and decreased safety awareness impacting safe and independent functional mobility.    How did symptoms start: Gradual onset exacerbated by treatment for breast cancer starting May 2024  Average pain intensity:  Last 24 hours: 4-5/10  Past week: up to 8-9/10  How often does the pt experience symptoms? Constantly  How much have the symptoms interfered with usual daily activities? Extremely  How has condition changed since care began at this facility? NA - initial visit  In general, how is the patients overall health? Fair  Onset date: Chronic   BACK PAIN (STarT Back Screening Tool) - (When applicable): N/A  Has your back pain spread down your leg(s) at sometime in the last 2 weeks? []  Yes   []  No Have you had pain in the shoulder or neck at sometime in the past 2 weeks? []  Yes   []  No Have you only walked short distances because of your back pain? []  Yes   []  No In the past 2 weeks, have you dressed more slowly than usual because of your back pain? []  Yes   []  No Do you think it is not really safe for person with a condition like yours to be physically active? []  Yes   []  No Have worrying thoughts been going through your mind a lot of the time? []  Yes   []  No Do you feel that your back pain is terrible and it is never going to get any better? []  Yes   []  No In general, have you  stopped enjoying all the things you usually enjoy? []  Yes   []  No Overall, how bothersome has your back pain been in the last 2 weeks? []  Not at all   []  Slightly     []  Moderate   []  Very much     []  Extremely

## 2023-04-26 ENCOUNTER — Ambulatory Visit: Payer: Medicare Other | Admitting: Physical Therapy

## 2023-04-26 ENCOUNTER — Encounter: Payer: Self-pay | Admitting: Physical Therapy

## 2023-04-26 DIAGNOSIS — M6281 Muscle weakness (generalized): Secondary | ICD-10-CM

## 2023-04-26 DIAGNOSIS — R2681 Unsteadiness on feet: Secondary | ICD-10-CM | POA: Diagnosis not present

## 2023-04-26 DIAGNOSIS — M25552 Pain in left hip: Secondary | ICD-10-CM | POA: Diagnosis not present

## 2023-04-26 DIAGNOSIS — R2689 Other abnormalities of gait and mobility: Secondary | ICD-10-CM

## 2023-04-26 NOTE — Therapy (Signed)
 OUTPATIENT PHYSICAL THERAPY TREATMENT   Patient Name: [name] MRN: [id number] DOB:Oct 09, 1940, 83 y.o., female Today's Date: 04/26/2023   END OF SESSION:  PT End of Session - 04/26/23 1451     Visit Number 4    Date for PT Re-Evaluation 05/29/23    Authorization Type [employer] Medicare    Authorization Time Period 04/03/23 - 05/29/23    Authorization - Visit Number 4    Authorization - Number of Visits 6    PT Start Time 1451    PT Stop Time 1531    PT Time Calculation (min) 40 min    Activity Tolerance Patient tolerated treatment well;Patient limited by fatigue    Behavior During Therapy [facility] for tasks assessed/performed               Past Medical History:  Diagnosis Date   Abdominal pain in female 03/18/2010   Qualifier: Diagnosis of  By: Leone Payor MD, Alfonse Ras E    Anemia 06/08/2014   Anxiety    Arthritis    Spinal Osteoarthritis   Breast cancer (HCC)    Carcinoid tumor of stomach    Cataract    Chest pain    Myoview 12/15 no ischemia.   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 09/05/2006   Qualifier: Diagnosis of  By: Charlsie Quest RMA, Lucy     Diabetic peripheral neuropathy (HCC) 10/29/2013   Encounter for preventative adult health care exam with abnormal findings 09/14/2013   Esophageal reflux    Gastric polyp    Fundic Gland   Gastroparesis    Headache(784.0)    Heart murmur    Echocardiogram 2/11: EF 60-65%, mild LAE, grade 1 diastolic dysfunction, aortic valve sclerosis, mean gradient 9 mm of mercury, PASP 34   Hematuria 03/16/2016   Iron deficiency anemia, unspecified    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   Leg swelling    bilateral   Neck pain 04/22/2015   PONV (postoperative nausea and vomiting)    pt states body temperature drops every time she has anesthesia; pt states only needs small amount of anesthesia   PSVT (paroxysmal supraventricular tachycardia) (HCC)     Pure hypercholesterolemia    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   Renal insufficiency 06/18/2019   pt states  L kidney function very low-functions at about 20%   Stroke [facility])    tia, 2014   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   Type II or unspecified type diabetes mellitus without mention of complication, not stated as uncontrolled    Unspecified essential hypertension    Unspecified hereditary and idiopathic peripheral neuropathy 10/29/2013   Vitamin D deficiency    Past Surgical History:  Procedure Laterality Date   BREAST BIOPSY Right 06/13/2022   [place] RT BREAST BX W LOC DEV 1ST LESION IMG BX SPEC US GUIDE 06/13/2022 [facility]   BREAST LUMPECTOMY Right 07/31/2022   Procedure: RIGHT BREAST LUMPECTOMY;  Surgeon: Abigail Miyamoto, MD;  Location: [facility];  Service: General;  Laterality: Right;   CHOLECYSTECTOMY  1993   COLONOSCOPY  11/11/2010   diverticulosis   DILATATION & CURRETTAGE/HYSTEROSCOPY WITH RESECTOCOPE N/A 02/25/2013   Procedure: Attempted hysteroscopy with uterine perforation;  Surgeon: Jacqualin Combes de Gwenevere Ghazi, MD;  Location: [facility];  Service: Gynecology;  Laterality: N/A;   ESOPHAGOGASTRODUODENOSCOPY  08/29/2010; 09/15/2010   Carcinoid tumor less than 1 cm in July 2012 not seen in August  2012 , gastritis, fundic gland polyps   ESOPHAGOGASTRODUODENOSCOPY  05/16/2011   ESOPHAGOGASTRODUODENOSCOPY  06/14/2012   EUS  12/15/2010   Procedure: UPPER ENDOSCOPIC ULTRASOUND (EUS) LINEAR;  Surgeon: Rob Bunting, MD;  Location: [facility];  Service: Endoscopy;  Laterality: N/A;   EYE SURGERY Bilateral    Bi lateral cateracts and bi lateral laser   LAPAROSCOPY N/A 02/25/2013   Procedure: Cystoscopy and laparoscopy with fulguration of uterine serosa;  Surgeon: Jacqualin Combes de Gwenevere Ghazi, MD;  Location: [facility];  Service: Gynecology;  Laterality: N/A;   TONSILLECTOMY     Patient Active Problem List   Diagnosis Date Noted   Precordial chest pain 12/24/2022   Carcinoma of  breast upper outer quadrant, right (HCC) 07/04/2022   SVT (supraventricular tachycardia) (HCC) 06/21/2022   Left-sided weakness 04/02/2022   Unsteady gait 04/02/2022   Oral lesion 03/08/2021   Sun-damaged skin 03/08/2021   Thiamine deficiency 11/01/2020   Trigeminal neuralgia 08/21/2020   Pedal edema 08/21/2020   Chronic left-sided back pain 03/28/2020   Nocturia 03/28/2020   Left-sided headache 03/28/2020   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   Renal insufficiency 06/18/2019   Educated about COVID-19 virus infection 05/15/2019   Atrophic vaginitis 01/20/2019   Stenosis of carotid artery 11/12/2018   Anxiety 10/13/2018   Referred otalgia of left ear 04/23/2018   Chronic throat clearing 04/23/2018   Sinusitis 10/11/2017   Headache 07/12/2017   Dizzy spells 11/21/2016   Constipation 11/21/2016   Nonrheumatic aortic valve stenosis 05/23/2016   Aortic atherosclerosis (HCC) 05/23/2016   Hematuria 03/16/2016   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   Pain of upper abdomen 06/06/2015   Neck pain 04/22/2015   Ear pain 02/28/2015   Abnormal urine 11/21/2014   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   Anemia 06/08/2014   RLS (restless legs syndrome) 11/23/2013   Diabetic peripheral neuropathy (HCC) 10/29/2013   Left-sided thoracic back pain 10/06/2013   Encounter for preventative adult health care exam with abnormal findings 09/14/2013   Iron deficiency anemia    Status post laparoscopy 02/25/2013   Hyponatremia 01/09/2013   GERD (gastroesophageal reflux disease) 01/09/2013   Amaurosis fugax of left eye 10/16/2012   Low back pain 06/03/2012   Vitamin D deficiency 03/11/2012   Bilateral hand pain 10/20/2011   Encounter for long-term (current) use of other medications 10/20/2011   IBS (irritable bowel syndrome) 08/14/2011   TIA (transient ischemic attack) 02/10/2011   Abnormal brain CT 01/19/2011   Allergic rhinitis 10/01/2010   Carcinoid tumor of stomach- history of  09/29/2010   Preventative health care 07/15/2010   FUNDIC GLAND POLYPS OF STOMACH 03/18/2010   Abdominal pain in female 03/18/2010   Left hip pain 03/17/2009   SYSTOLIC MURMUR 03/02/2009   Paroxysmal supraventricular tachycardia (HCC) 01/12/2009   PLANTAR FASCIITIS 06/08/2008   CHEST PAIN 05/18/2008   Gastroparesis 12/18/2007   HYPERCHOLESTEROLEMIA 06/11/2007   Type 2 diabetes mellitus with obesity (HCC) 09/05/2006   Essential hypertension 09/05/2006    PCP: [name], MD   REFERRING PROVIDER: [name], MD   REFERRING DIAG:  M79.10 (ICD-10-CM) - Myalgia  R26.81 (ICD-10-CM) - Unsteady gait  R53.1 (ICD-10-CM) - Weakness   THERAPY DIAG:  Muscle weakness (generalized)  Other abnormalities of gait and mobility  Unsteadiness on feet  Pain in left hip  RATIONALE FOR EVALUATION AND TREATMENT: Rehabilitation  ONSET DATE: Worsening over the past year, especially since breast cancer diagnosis and May 2024  NEXT MD VISIT: 04/23/2023  SUBJECTIVE:                                                                                                                                                                                                         SUBJECTIVE STATEMENT: Pt denies need for review of initial [place] activities and feels ready to proceed with the rest of the training.   EVAL: Pt reports her weakness and pains seems to be exacerbated by recent meds she has had to take. She notes difficulty climbing stairs and does not feel safe using a step stool to reach high things.  She feels like she has no energy.  She reports chronic belly pain, maybe from meds vs diverticulitis.  L hip pain has been an issue for several months but patient has been told there is no "bony" reason for her pain (x-rays negative).  PAIN: Are you having pain? Yes: NPRS scale: 6/10  Pain location: L lateral hip Pain description: dull, sharp when more intense  Aggravating factors: lying on side  to sleep (worse when lying on L than R), first moving in the morning Relieving factors: walking, Tylenol   Are you having pain? No and Yes: NPRS scale: 0/10 Pain location: B upper shoulders Pain description: pinching Aggravating factors: uncertain Relieving factors: keep moving  PERTINENT HISTORY:  Extensive PMH including chronic back and neck pain, OA, osteoporosis, DM, HTN, h/o TIA, diabetic peripheral neuropathy, PVD, headaches, sleep dysfunction, anxiety, R breast cancer s/p lumpectomy 07/31/22, SVT (refer to above problem list and past medical/surgical history for full PMH)   PRECAUTIONS: Fall  RED FLAGS: None  WEIGHT BEARING RESTRICTIONS: No  FALLS:  Has patient fallen in last 6 months? No - admits to a few close calls due to dizziness/lightheadness  LIVING ENVIRONMENT: Lives with: lives alone  Lives in: House/apartment Stairs: No Has following equipment at home: Single point cane, Environmental consultant - 4 wheeled, shower chair, and Grab bars  OCCUPATION: Retired  PLOF: Independent, Needs assistance with homemaking, Leisure: read and play card games on her phone, traveling, and housekeeping/cleaning service ~1x/month    PATIENT GOALS: "To be able to walk steady and feel comfortable going up stairs so I can visit my niece and nephew. To be able to carry groceries."   OBJECTIVE: (objective measures completed at initial evaluation unless otherwise dated)  DIAGNOSTIC FINDINGS:  01/22/23 & 10/05/22 - DH Left hip (2 several months of L hip pain) IMPRESSION: Negative.  COGNITION: Overall cognitive status: Within functional limits for tasks assessed   SENSATION: Numbness and tingling in hands and feet from  DM and cervical radiculopathy  POSTURE:  rounded shoulders, forward head, increased thoracic kyphosis, and flexed trunk   PALPATION: Increased TTP over L TFL, hip flexors and abductors  UPPER EXTREMITY MMT:  MMT Right eval Left eval  Shoulder flexion 4- 4-  Shoulder  extension 3+ 3+  Shoulder abduction 4- 4-  Shoulder adduction    Shoulder internal rotation 4- 4-  Shoulder external rotation 3+ 3+  Middle trapezius    Lower trapezius     (Blank rows = not tested)  MUSCLE LENGTH: Hamstrings: mod tight B ITB: mod tight B  Piriformis: severe tight B Hip flexors: mild/mod tight L>R  Quads: mild/mod tight L>R  Heelcord: mod tight B  LOWER EXTREMITY ROM:    Mildly limited primarily due to muscle tightness  LOWER EXTREMITY MMT:  (tested in sitting on eval)  MMT Right eval Left eval  Hip flexion 3+ 3+  Hip extension 3- 3-  Hip abduction 3+ 3+  Hip adduction 3+ 3+  Hip internal rotation 3- 3-  Hip external rotation 3- 3-  Knee flexion 4- 4-  Knee extension 4- 4  Ankle dorsiflexion 3+ 4-  Ankle plantarflexion    Ankle inversion    Ankle eversion    (Blank rows = not tested)  BED MOBILITY:  Sit to supine Min A Supine to sit Min A Rolling to Right SBA  TRANSFERS: Assistive device utilized: None  Sit to stand: Complete Independence Stand to sit: Complete Independence Chair to chair: Modified independence Floor:  NT  GAIT: Distance walked: Clinic distances Assistive device utilized: None Level of assistance: Complete Independence and SBA Gait pattern: decreased stride length and trunk flexed Comments: Decreased gait speed  RAMP: Level of Assistance:  NT Assistive device utilized:    Ramp Comments:   CURB:  Level of Assistance:  NT Assistive device utilized:    Curb Comments:   STAIRS: Level of Assistance: SBA Stair Negotiation Technique: Step to Pattern, Sideways (descent), Forwards (ascent) with Single Rail on Right Number of Stairs: 14  Height of Stairs: 7"  Comments: forward for ascent, sideways leading with R foot for descent    FUNCTIONAL TESTS: (TBA next visit) 5 times sit to stand: 30.43 sec; >15 sec indicates a risk for recurrent falls  Timed up and go (TUG): 16.03 sec; >13.5 sec indicates a high risk for falls   10 meter walk test: 13.91 sec ; Gait speed = 2.36 ft/sec; >/=2.62 ft/sec is necessary to be considered a Materials engineer Scale: 44/56; Scores of 37-45 indicate significant risk for falls (>80%)  Dynamic Gait Index: 15/24; Scores of 19 or less are predictive of falls in older community living adults.    PATIENT SURVEYS:  ABC scale 580 / 1600 = 36.3 % , scores of <50% indicating a low level of physical functioning and scores of <67% indicating a risk for falls in patients with vestibular disorders   TODAY'S TREATMENT:   04/26/2023  NEUROMUSCULAR RE-EDUCATION: To improve balance, coordination, kinesthesia, posture, proprioception, and reduce fall risk.  [place] Fall Prevention Program: Balance: Backwards walking 4 x 10 steps (no support) Walking and turning around (figure 8 pattern) x 3 Sideways walking 2  x 10 steps each direction (no support) Heel toe standing (tandem stance) x 10 sec each side (no support) Heel toe walking (tandem gait) 4 x 10 steps (no support) One leg stand x 10 sec each side (hold support) Heel walking 4 x 10 steps (hold support) Toe walking 4  x 10 steps (hold support) Heel toe backwards walking 4 x 10 steps (hold support) Sit to stands with no hands support x 10 Stair walking with 1 HR - deferred    04/24/2023  THERAPEUTIC EXERCISE: To improve strength, endurance, ROM, and flexibility.  Demonstration, verbal and tactile cues throughout for technique.  NuStep - L3 x 6 min (UE/LE - seat #6, handles #7) [place] Fall Prevention Program: Head movements (rotation) x 5 Neck movements (chin tucks x 5) Back Extension x 5 (against counter for safety) -Cue to use counter for stability to prevent posterior loss of balance Trunk movements (trunk rotation with hands on hips) x 5 each side Ankle movements (seated ankle pumps) x 10 each foot Strengthening: Front knee strengthening (seated LAQ)  2 x 10 Back knee strengthening (standing hamstring curls) x 10  bilateral (B UE support on counter) Side hip strengthening (hip abduction) x 10 bilateral (single UE support on counter) Calf raises x 10 (hold support)  Toe raises x 10 (hold support) Balance: Knee bends x 10 (hold support)  SELF CARE: Provided education to reduce fall risk and to promote safe home environment.  Reviewed the "Check for Safety - Home Fall Prevention Checklist for Older Adults" to help identify fall risk hazards in the home along with strategies to reduce fall risk at home    04/09/2023  PHYSICAL PERFORMANCE TEST or MEASUREMENT: Orthostatic BP assessment - SBP drop of >/= 20 points with associated dizziness/lightheadedness is indicative of postural/orthostatic hypotension  Orthostatics  BP supine (x 5 minutes) 130/60   HR supine (x 5 minutes) 65   BP sitting 114/54   HR sitting 68   BP standing (after 1 minute) 110/58   HR standing (after 1 minute) 64   BP standing (after 3 minutes) 120/58   HR standing (after 3 minutes) 62   Orthostatics Comment dizziness and lightheadedness reported with transitions supine to sit and sit to stand      5xSTS = 30.43 sec; ; >15 sec indicates a risk for recurrent falls TUG: 16.03 sec; >13.5 sec indicates a high risk for falls : 13.91 sec  Gait speed = 2.36 ft/sec; >/=2.62 ft/sec is necessary to be considered a Chief Operating Officer Scale: 44/56; 37-45 significant risk for falls (>80%)  Dynamic Gait Index: 15/24; Scores of 19 or less are predictive of falls in older community living adults.   Standardized Balance Assessment   Standardized Balance Assessment Berg Balance Test;Dynamic Gait Index      Berg Balance Test   Sit to Stand Able to stand without using hands and stabilize independently    Standing Unsupported Able to stand safely 2 minutes    Sitting with Back Unsupported but Feet Supported on Floor or Stool Able to sit safely and securely 2 minutes    Stand to Sit Sits safely with minimal use of hands     Transfers Able to transfer safely, minor use of hands    Standing Unsupported with Eyes Closed Able to stand 10 seconds safely    Standing Unsupported with Feet Together Able to place feet together independently and stand for 1 minute with supervision    From Standing, Reach Forward with Outstretched Arm Can reach forward >12 cm safely (5")    From Standing Position, Pick up Object from Floor Able to pick up shoe, needs supervision    From Standing Position, Turn to Look Behind Over each Shoulder Needs supervision when turning    Turn 360 Degrees Able to  turn 360 degrees safely but slowly    Standing Unsupported, Alternately Place Feet on Step/Stool Able to stand independently and complete 8 steps >20 seconds    Standing Unsupported, One Foot in Front Able to plae foot ahead of the other independently and hold 30 seconds    Standing on One Leg Able to lift leg independently and hold equal to or more than 3 seconds    Total Score 44    Berg comment: 37-45 significant (>80%)      Dynamic Gait Index   Level Surface Mild Impairment    Change in Gait Speed Severe Impairment    Gait with Horizontal Head Turns Normal    Gait with Vertical Head Turns Mild Impairment    Gait and Pivot Turn Mild Impairment    Step Over Obstacle Mild Impairment    Step Around Obstacles Normal    Steps Moderate Impairment    Total Score 15    DGI comment: Scores of 19 or less are predictive of falls in older community living adults.       PATIENT EDUCATION:  Education details: Special educational needs teacher  Person educated: Patient Education method: Explanation, Demonstration, Verbal cues, and Handouts Education comprehension: verbalized understanding, returned demonstration, verbal cues required, and needs further education  HOME EXERCISE PROGRAM: Access Code: [email or web address] URL: [email or web address] Date: 04/24/2023 Prepared by: Glenetta Hew  Patient Education - Check for Safety - [place]  Program   ASSESSMENT:  CLINICAL IMPRESSION: Completed training in balance portion of [place] fall prevention program with handout highlighting updated to reflect appropriate levels for home performance as well as guidance on how to safely progress activities as she feels more comfortable at home.  Maximina instructed to bring her booklet with her to the next visit if she has any issues with home performance, otherwise will plan to continue to progress strengthening and balance.  Addilynn will benefit from continued skilled PT to address ongoing deficits and impairments to improve mobility and activity tolerance to help reach the maximal level of functional independence and mobility.    OBJECTIVE IMPAIRMENTS: Abnormal gait, decreased activity tolerance, decreased balance, decreased coordination, decreased endurance, decreased knowledge of condition, decreased knowledge of use of DME, decreased mobility, difficulty walking, decreased ROM, decreased strength, decreased safety awareness, dizziness, increased fascial restrictions, impaired perceived functional ability, increased muscle spasms, impaired flexibility, impaired sensation, impaired UE functional use, improper body mechanics, postural dysfunction, and pain.   ACTIVITY LIMITATIONS: carrying, lifting, bending, standing, squatting, sleeping, stairs, transfers, bed mobility, and locomotion level  PARTICIPATION LIMITATIONS: meal prep, cleaning, laundry, driving, shopping, community activity, and church  PERSONAL FACTORS: Age, Fitness, Past/current experiences, Social background, Time since onset of injury/illness/exacerbation, and 3+ comorbidities: Extensive PMH including chronic back and neck pain, OA, osteoporosis, DM, HTN, h/o TIA, diabetic peripheral neuropathy, PVD, headaches, sleep dysfunction, anxiety, R breast cancer s/p lumpectomy 07/31/22, SVT (refer to above problem list and past medical/surgical history for full PMH)   are also affecting  patient's functional outcome.   REHAB POTENTIAL: Good  CLINICAL DECISION MAKING: Unstable/unpredictable  EVALUATION COMPLEXITY: High   GOALS: Goals reviewed with patient? Yes  SHORT TERM GOALS: Target date: 05/01/2023  Patient will be independent with initial HEP to improve outcomes and carryover.  Baseline:  Goal status: IN PROGRESS - 04/26/23 - training completed in [place] fall prevention program  2.  Patient will be educated on strategies to decrease risk of falls.  Baseline:  Goal status: MET - 04/24/23  3.  Patient will demonstrate decreased  TUG time to </= 13.5 sec to decrease risk for falls with transitional mobility. Baseline:  Goal status: IN PROGRESS  LONG TERM GOALS: Target date: 05/29/2023  Patient will be independent with advanced/ongoing HEP to facilitate ability to maintain/progress functional gains from skilled physical therapy services. Baseline:  Goal status: IN PROGRESS  2.  Patient will be able to ambulate 600' with or w/o LRAD on variable surfaces with good safety to access community.  Baseline:  Goal status: IN PROGRESS  3.  Patient will be able to step up/down curb safely with or w/o LRAD for safety with community ambulation.  Baseline:  Goal status: IN PROGRESS   4.  Patient will demonstrate improved B LE strength to >/= 4/5 for improved stability and ease of mobility. Baseline: Refer to above LE MMT table Goal status: IN PROGRESS  5.  Patient will improve 5x STS time to </= 15 seconds for improved efficiency and safety with transfers. Baseline: 30.43 sec (04/09/23) Goal status: IN PROGRESS   6.  Patient will demonstrate gait speed of >/= 2.62 ft/sec (0.8 m/s) to be a safe community ambulator with decreased risk for recurrent falls.  Baseline: 2.36 ft/sec (04/09/23) Goal status: IN PROGRESS  7.  Patient will improve Berg score by at least 8 points to improve safety and stability with ADLs in standing and reduce risk for falls.  Baseline: 44/56  (04/09/23) Goal status: IN PROGRESS  8.  Patient will demonstrate at least 19/24 on DGI or 19/30 on FGA to improve gait stability and reduce risk for falls. Baseline: 15/24 (04/09/23) Goal status: IN PROGRESS  9. Patient will report >/= 50% on ABC scale to demonstrate improved balance confidence with functional mobility and gait. Baseline: 580 / 1600 = 36.3 % Goal status: IN PROGRESS   PLAN:  PT FREQUENCY: 2x/week  PT DURATION: 8 weeks  PLANNED INTERVENTIONS: 97164- PT Re-evaluation, 97110-Therapeutic exercises, 97530- Therapeutic activity, 97112- Neuromuscular re-education, 97535- Self Care, 16109- Manual therapy, [phone]- Gait training, [phone]- Canalith repositioning, [id number]- Electrical stimulation (unattended), 97035- Ultrasound, 29562- Ionotophoresis 4mg /ml Dexamethasone, Patient/Family education, Balance training, Stair training, Taping, Dry Needling, Joint mobilization, Spinal mobilization, Vestibular training, Cryotherapy, Moist heat, and 97750- Physical performance test or measurement  PLAN FOR NEXT SESSION: Reassess STGs including TUG; review [place] fall prevention program PRN; LE strengthening; balance training    Marry Guan, PT 04/26/2023, 3:47 PM    Date of referral: 01/16/2023 Referring provider: [name], MD Referring diagnosis?  M79.10 (ICD-10-CM) - Myalgia  R26.81 (ICD-10-CM) - Unsteady gait  R53.1 (ICD-10-CM) - Weakness  Treatment diagnosis? (if different than referring diagnosis)  Muscle weakness (generalized)  Other abnormalities of gait and mobility  Unsteadiness on feet  Pain in left hip  What was this (referring dx) caused by? Ongoing Issue and Other: Sequelae of treatments for breast cancer  Nature of Condition: Chronic (continuous duration > 3 months)   Laterality: Lt  Current Functional Measure Score: Other ABC scale = 580 / 1600 = 36.3 %  Objective measurements identify impairments when they are compared to normal values, the uninvolved  extremity, and prior level of function.  [x]  Yes  []  No  Objective assessment of functional ability: Severe functional limitations   Briefly describe symptoms: Patient presents with physical impairments of L anterior lateral hip pain, impaired activity tolerance, global UE/LE weakness, dizziness, impaired standing balance, impaired ambulation, inability to navigate stairs and decreased safety awareness impacting safe and independent functional mobility.    How did symptoms start: Gradual onset  exacerbated by treatment for breast cancer starting May 2024  Average pain intensity:  Last 24 hours: 4-5/10  Past week: up to 8-9/10  How often does the pt experience symptoms? Constantly  How much have the symptoms interfered with usual daily activities? Extremely  How has condition changed since care began at this facility? NA - initial visit  In general, how is the patients overall health? Fair  Onset date: Chronic   BACK PAIN (STarT Back Screening Tool) - (When applicable): N/A  Has your back pain spread down your leg(s) at sometime in the last 2 weeks? []  Yes   []  No Have you had pain in the shoulder or neck at sometime in the past 2 weeks? []  Yes   []  No Have you only walked short distances because of your back pain? []  Yes   []  No In the past 2 weeks, have you dressed more slowly than usual because of your back pain? []  Yes   []  No Do you think it is not really safe for person with a condition like yours to be physically active? []  Yes   []  No Have worrying thoughts been going through your mind a lot of the time? []  Yes   []  No Do you feel that your back pain is terrible and it is never going to get any better? []  Yes   []  No In general, have you stopped enjoying all the things you usually enjoy? []  Yes   []  No Overall, how bothersome has your back pain been in the last 2 weeks? []  Not at all   []  Slightly     []  Moderate   []  Very much     []  Extremely

## 2023-04-27 ENCOUNTER — Other Ambulatory Visit: Payer: Self-pay | Admitting: Family Medicine

## 2023-04-27 DIAGNOSIS — Z853 Personal history of malignant neoplasm of breast: Secondary | ICD-10-CM

## 2023-04-27 DIAGNOSIS — Z9889 Other specified postprocedural states: Secondary | ICD-10-CM

## 2023-05-01 ENCOUNTER — Ambulatory Visit: Payer: Medicare Other | Admitting: Physical Therapy

## 2023-05-01 ENCOUNTER — Encounter: Payer: Self-pay | Admitting: Physical Therapy

## 2023-05-01 DIAGNOSIS — R2681 Unsteadiness on feet: Secondary | ICD-10-CM | POA: Diagnosis not present

## 2023-05-01 DIAGNOSIS — R2689 Other abnormalities of gait and mobility: Secondary | ICD-10-CM | POA: Diagnosis not present

## 2023-05-01 DIAGNOSIS — M6281 Muscle weakness (generalized): Secondary | ICD-10-CM

## 2023-05-01 DIAGNOSIS — M25552 Pain in left hip: Secondary | ICD-10-CM

## 2023-05-01 NOTE — Therapy (Signed)
 OUTPATIENT PHYSICAL THERAPY TREATMENT   Patient Name: [name] MRN: [id number] DOB:May 24, 1940, 83 y.o., female Today's Date: 05/01/2023   END OF SESSION:  PT End of Session - 05/01/23 1405     Date for PT Re-Evaluation 05/29/23    Authorization Type [employer] Medicare    Authorization Time Period 04/03/23 - 05/29/23    Authorization - Visit Number 5    Authorization - Number of Visits 6    PT Start Time 1405    PT Stop Time 1445    PT Time Calculation (min) 40 min    Activity Tolerance Patient tolerated treatment well;Patient limited by fatigue    Behavior During Therapy [facility] for tasks assessed/performed               Past Medical History:  Diagnosis Date   Abdominal pain in female 03/18/2010   Qualifier: Diagnosis of  By: Leone Payor MD, Alfonse Ras E    Anemia 06/08/2014   Anxiety    Arthritis    Spinal Osteoarthritis   Breast cancer (HCC)    Carcinoid tumor of stomach    Cataract    Chest pain    Myoview 12/15 no ischemia.   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 09/05/2006   Qualifier: Diagnosis of  By: Charlsie Quest RMA, Lucy     Diabetic peripheral neuropathy (HCC) 10/29/2013   Encounter for preventative adult health care exam with abnormal findings 09/14/2013   Esophageal reflux    Gastric polyp    Fundic Gland   Gastroparesis    Headache(784.0)    Heart murmur    Echocardiogram 2/11: EF 60-65%, mild LAE, grade 1 diastolic dysfunction, aortic valve sclerosis, mean gradient 9 mm of mercury, PASP 34   Hematuria 03/16/2016   Iron deficiency anemia, unspecified    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   Leg swelling    bilateral   Neck pain 04/22/2015   PONV (postoperative nausea and vomiting)    pt states body temperature drops every time she has anesthesia; pt states only needs small amount of anesthesia   PSVT (paroxysmal supraventricular tachycardia) (HCC)    Pure  hypercholesterolemia    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   Renal insufficiency 06/18/2019   pt states  L kidney function very low-functions at about 20%   Stroke [facility])    tia, 2014   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   Type II or unspecified type diabetes mellitus without mention of complication, not stated as uncontrolled    Unspecified essential hypertension    Unspecified hereditary and idiopathic peripheral neuropathy 10/29/2013   Vitamin D deficiency    Past Surgical History:  Procedure Laterality Date   BREAST BIOPSY Right 06/13/2022   [place] RT BREAST BX W LOC DEV 1ST LESION IMG BX SPEC US GUIDE 06/13/2022 [facility]   BREAST LUMPECTOMY Right 07/31/2022   Procedure: RIGHT BREAST LUMPECTOMY;  Surgeon: Abigail Miyamoto, MD;  Location: [facility];  Service: General;  Laterality: Right;   CHOLECYSTECTOMY  1993   COLONOSCOPY  11/11/2010   diverticulosis   DILATATION & CURRETTAGE/HYSTEROSCOPY WITH RESECTOCOPE N/A 02/25/2013   Procedure: Attempted hysteroscopy with uterine perforation;  Surgeon: Jacqualin Combes de Gwenevere Ghazi, MD;  Location: [facility];  Service: Gynecology;  Laterality: N/A;   ESOPHAGOGASTRODUODENOSCOPY  08/29/2010; 09/15/2010   Carcinoid tumor less than 1 cm in July 2012 not seen in August 2012 , gastritis, fundic gland polyps  ESOPHAGOGASTRODUODENOSCOPY  05/16/2011   ESOPHAGOGASTRODUODENOSCOPY  06/14/2012   EUS  12/15/2010   Procedure: UPPER ENDOSCOPIC ULTRASOUND (EUS) LINEAR;  Surgeon: Rob Bunting, MD;  Location: [facility];  Service: Endoscopy;  Laterality: N/A;   EYE SURGERY Bilateral    Bi lateral cateracts and bi lateral laser   LAPAROSCOPY N/A 02/25/2013   Procedure: Cystoscopy and laparoscopy with fulguration of uterine serosa;  Surgeon: Jacqualin Combes de Gwenevere Ghazi, MD;  Location: [facility];  Service: Gynecology;  Laterality: N/A;   TONSILLECTOMY     Patient Active Problem List   Diagnosis Date Noted   Precordial chest pain 12/24/2022   Carcinoma of  breast upper outer quadrant, right (HCC) 07/04/2022   SVT (supraventricular tachycardia) (HCC) 06/21/2022   Left-sided weakness 04/02/2022   Unsteady gait 04/02/2022   Oral lesion 03/08/2021   Sun-damaged skin 03/08/2021   Thiamine deficiency 11/01/2020   Trigeminal neuralgia 08/21/2020   Pedal edema 08/21/2020   Chronic left-sided back pain 03/28/2020   Nocturia 03/28/2020   Left-sided headache 03/28/2020   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   Renal insufficiency 06/18/2019   Educated about COVID-19 virus infection 05/15/2019   Atrophic vaginitis 01/20/2019   Stenosis of carotid artery 11/12/2018   Anxiety 10/13/2018   Referred otalgia of left ear 04/23/2018   Chronic throat clearing 04/23/2018   Sinusitis 10/11/2017   Headache 07/12/2017   Dizzy spells 11/21/2016   Constipation 11/21/2016   Nonrheumatic aortic valve stenosis 05/23/2016   Aortic atherosclerosis (HCC) 05/23/2016   Hematuria 03/16/2016   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   Pain of upper abdomen 06/06/2015   Neck pain 04/22/2015   Ear pain 02/28/2015   Abnormal urine 11/21/2014   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   Anemia 06/08/2014   RLS (restless legs syndrome) 11/23/2013   Diabetic peripheral neuropathy (HCC) 10/29/2013   Left-sided thoracic back pain 10/06/2013   Encounter for preventative adult health care exam with abnormal findings 09/14/2013   Iron deficiency anemia    Status post laparoscopy 02/25/2013   Hyponatremia 01/09/2013   GERD (gastroesophageal reflux disease) 01/09/2013   Amaurosis fugax of left eye 10/16/2012   Low back pain 06/03/2012   Vitamin D deficiency 03/11/2012   Bilateral hand pain 10/20/2011   Encounter for long-term (current) use of other medications 10/20/2011   IBS (irritable bowel syndrome) 08/14/2011   TIA (transient ischemic attack) 02/10/2011   Abnormal brain CT 01/19/2011   Allergic rhinitis 10/01/2010   Carcinoid tumor of stomach- history of  09/29/2010   Preventative health care 07/15/2010   FUNDIC GLAND POLYPS OF STOMACH 03/18/2010   Abdominal pain in female 03/18/2010   Left hip pain 03/17/2009   SYSTOLIC MURMUR 03/02/2009   Paroxysmal supraventricular tachycardia (HCC) 01/12/2009   PLANTAR FASCIITIS 06/08/2008   CHEST PAIN 05/18/2008   Gastroparesis 12/18/2007   HYPERCHOLESTEROLEMIA 06/11/2007   Type 2 diabetes mellitus with obesity (HCC) 09/05/2006   Essential hypertension 09/05/2006    PCP: [name], MD   REFERRING PROVIDER: [name], MD   REFERRING DIAG:  M79.10 (ICD-10-CM) - Myalgia  R26.81 (ICD-10-CM) - Unsteady gait  R53.1 (ICD-10-CM) - Weakness   THERAPY DIAG:  Muscle weakness (generalized)  Other abnormalities of gait and mobility  Unsteadiness on feet  Pain in left hip  RATIONALE FOR EVALUATION AND TREATMENT: Rehabilitation  ONSET DATE: Worsening over the past year, especially since breast cancer diagnosis and May 2024  NEXT MD VISIT: 04/23/2023   SUBJECTIVE:  SUBJECTIVE STATEMENT: Pt reports she is very tired today - muscles achy all over.   EVAL: Pt reports her weakness and pains seems to be exacerbated by recent meds she has had to take. She notes difficulty climbing stairs and does not feel safe using a step stool to reach high things.  She feels like she has no energy.  She reports chronic belly pain, maybe from meds vs diverticulitis.  L hip pain has been an issue for several months but patient has been told there is no "bony" reason for her pain (x-rays negative).  PAIN: Are you having pain? Yes: NPRS scale: 8/10  Pain location: L lateral hip Pain description: dull, sharp when more intense  Aggravating factors: lying on side to sleep (worse when lying on L than R), first  moving in the morning Relieving factors: walking, Tylenol   Are you having pain? No and Yes: NPRS scale: 0/10 Pain location: B upper shoulders Pain description: pinching Aggravating factors: uncertain Relieving factors: keep moving  PERTINENT HISTORY:  Extensive PMH including chronic back and neck pain, OA, osteoporosis, DM, HTN, h/o TIA, diabetic peripheral neuropathy, PVD, headaches, sleep dysfunction, anxiety, R breast cancer s/p lumpectomy 07/31/22, SVT (refer to above problem list and past medical/surgical history for full PMH)   PRECAUTIONS: Fall  RED FLAGS: None  WEIGHT BEARING RESTRICTIONS: No  FALLS:  Has patient fallen in last 6 months? No - admits to a few close calls due to dizziness/lightheadness  LIVING ENVIRONMENT: Lives with: lives alone  Lives in: House/apartment Stairs: No Has following equipment at home: Single point cane, Environmental consultant - 4 wheeled, shower chair, and Grab bars  OCCUPATION: Retired  PLOF: Independent, Needs assistance with homemaking, Leisure: read and play card games on her phone, traveling, and housekeeping/cleaning service ~1x/month    PATIENT GOALS: "To be able to walk steady and feel comfortable going up stairs so I can visit my niece and nephew. To be able to carry groceries."   OBJECTIVE: (objective measures completed at initial evaluation unless otherwise dated)  DIAGNOSTIC FINDINGS:  01/22/23 & 10/05/22 - DH Left hip (2 several months of L hip pain) IMPRESSION: Negative.  COGNITION: Overall cognitive status: Within functional limits for tasks assessed   SENSATION: Numbness and tingling in hands and feet from DM and cervical radiculopathy  POSTURE:  rounded shoulders, forward head, increased thoracic kyphosis, and flexed trunk   PALPATION: Increased TTP over L TFL, hip flexors and abductors  UPPER EXTREMITY MMT:  MMT Right eval Left eval  Shoulder flexion 4- 4-  Shoulder extension 3+ 3+  Shoulder abduction 4- 4-  Shoulder  adduction    Shoulder internal rotation 4- 4-  Shoulder external rotation 3+ 3+  Middle trapezius    Lower trapezius     (Blank rows = not tested)  MUSCLE LENGTH: Hamstrings: mod tight B ITB: mod tight B  Piriformis: severe tight B Hip flexors: mild/mod tight L>R  Quads: mild/mod tight L>R  Heelcord: mod tight B  LOWER EXTREMITY ROM:    Mildly limited primarily due to muscle tightness  LOWER EXTREMITY MMT:  (tested in sitting on eval)  MMT Right eval Left eval  Hip flexion 3+ 3+  Hip extension 3- 3-  Hip abduction 3+ 3+  Hip adduction 3+ 3+  Hip internal rotation 3- 3-  Hip external rotation 3- 3-  Knee flexion 4- 4-  Knee extension 4- 4  Ankle dorsiflexion 3+ 4-  Ankle plantarflexion    Ankle inversion    Ankle  eversion    (Blank rows = not tested)  BED MOBILITY:  Sit to supine Min A Supine to sit Min A Rolling to Right SBA  TRANSFERS: Assistive device utilized: None  Sit to stand: Complete Independence Stand to sit: Complete Independence Chair to chair: Modified independence Floor:  NT  GAIT: Distance walked: Clinic distances Assistive device utilized: None Level of assistance: Complete Independence and SBA Gait pattern: decreased stride length and trunk flexed Comments: Decreased gait speed  RAMP: Level of Assistance:  NT Assistive device utilized:    Ramp Comments:   CURB:  Level of Assistance:  NT Assistive device utilized:    Curb Comments:   STAIRS: Level of Assistance: SBA Stair Negotiation Technique: Step to Pattern, Sideways (descent), Forwards (ascent) with Single Rail on Right Number of Stairs: 14  Height of Stairs: 7"  Comments: forward for ascent, sideways leading with R foot for descent    FUNCTIONAL TESTS: (TBA next visit) 5 times sit to stand: 30.43 sec; >15 sec indicates a risk for recurrent falls  Timed up and go (TUG): 16.03 sec; >13.5 sec indicates a high risk for falls  10 meter walk test: 13.91 sec ; Gait speed = 2.36  ft/sec; >/=2.62 ft/sec is necessary to be considered a Materials engineer Scale: 44/56; Scores of 37-45 indicate significant risk for falls (>80%)  Dynamic Gait Index: 15/24; Scores of 19 or less are predictive of falls in older community living adults.    PATIENT SURVEYS:  ABC scale 580 / 1600 = 36.3 % , scores of <50% indicating a low level of physical functioning and scores of <67% indicating a risk for falls in patients with vestibular disorders   TODAY'S TREATMENT:   05/01/2023  THERAPEUTIC EXERCISE: To improve strength and endurance.  Demonstration, verbal and tactile cues throughout for technique.  NuStep - L4 x 6 min (UE/LE) Seated RTB B hip ABD/ER clam 2 x 10 Seated RTB alt hip flexion march 2 x 10 Sit to stand + RTB hip ABD isometric (looped band at knees) 1 x 5, 1 x 6 Seated RTB HS curls x 10 bil  Seated Fitter leg press (1 black) x 10 bil  NEUROMUSCULAR RE-EDUCATION: To improve kinesthesia and posture. Seated YTB scap retraction + B shoulder row x 10 Seated YTB scap retraction + B shoulder extension x 10 Seated YTB pallof press x 10 bil   04/26/2023  NEUROMUSCULAR RE-EDUCATION: To improve balance, coordination, kinesthesia, posture, proprioception, and reduce fall risk.  [place] Fall Prevention Program: Balance: Backwards walking 4 x 10 steps (no support) Walking and turning around (figure 8 pattern) x 3 Sideways walking 2  x 10 steps each direction (no support) Heel toe standing (tandem stance) x 10 sec each side (no support) Heel toe walking (tandem gait) 4 x 10 steps (no support) One leg stand x 10 sec each side (hold support) Heel walking 4 x 10 steps (hold support) Toe walking 4 x 10 steps (hold support) Heel toe backwards walking 4 x 10 steps (hold support) Sit to stands with no hands support x 10 Stair walking with 1 HR - deferred    04/24/2023  THERAPEUTIC EXERCISE: To improve strength, endurance, ROM, and flexibility.  Demonstration, verbal  and tactile cues throughout for technique.  NuStep - L3 x 6 min (UE/LE - seat #6, handles #7) [place] Fall Prevention Program: Head movements (rotation) x 5 Neck movements (chin tucks x 5) Back Extension x 5 (against counter for safety) -Cue to  use counter for stability to prevent posterior loss of balance Trunk movements (trunk rotation with hands on hips) x 5 each side Ankle movements (seated ankle pumps) x 10 each foot Strengthening: Front knee strengthening (seated LAQ)  2 x 10 Back knee strengthening (standing hamstring curls) x 10 bilateral (B UE support on counter) Side hip strengthening (hip abduction) x 10 bilateral (single UE support on counter) Calf raises x 10 (hold support)  Toe raises x 10 (hold support) Balance: Knee bends x 10 (hold support)  SELF CARE: Provided education to reduce fall risk and to promote safe home environment.  Reviewed the "Check for Safety - Home Fall Prevention Checklist for Older Adults" to help identify fall risk hazards in the home along with strategies to reduce fall risk at home    PATIENT EDUCATION:  Education details: continue with current HEP and [employer]  Person educated: Patient Education method: Explanation Education comprehension: verbalized understanding  HOME EXERCISE PROGRAM: Access Code: [email or web address] URL: [email or web address] Date: 04/24/2023 Prepared by: Glenetta Hew  Patient Education - Check for Safety - [place] Program   ASSESSMENT:  CLINICAL IMPRESSION: Eilene reports increased fatigue and achiness all over today without known precipitating cause - she denies any symptoms of illness.  She denies any issues with her [place] fall prevention program HEP and does not feel she needs further review, but admits to limited performance due to current fatigue and achiness.  As such focused on core and proximal UE and LE strengthening from sitting position today providing frequent rest breaks between  exercises to allow for recovery periods.  No modifications made to HEP with patient instructed to continue with [place] fall prevention program aiming for 3x/wk completion breaking exercises and activities into smaller groups with rest breaks when needed.  Deferred TUG reassessment today and will plan to reassess this next visit.  Lianah will benefit from continued skilled PT to address ongoing deficits and impairments to improve mobility and activity tolerance to help reach the maximal level of functional independence and mobility.    OBJECTIVE IMPAIRMENTS: Abnormal gait, decreased activity tolerance, decreased balance, decreased coordination, decreased endurance, decreased knowledge of condition, decreased knowledge of use of DME, decreased mobility, difficulty walking, decreased ROM, decreased strength, decreased safety awareness, dizziness, increased fascial restrictions, impaired perceived functional ability, increased muscle spasms, impaired flexibility, impaired sensation, impaired UE functional use, improper body mechanics, postural dysfunction, and pain.   ACTIVITY LIMITATIONS: carrying, lifting, bending, standing, squatting, sleeping, stairs, transfers, bed mobility, and locomotion level  PARTICIPATION LIMITATIONS: meal prep, cleaning, laundry, driving, shopping, community activity, and church  PERSONAL FACTORS: Age, Fitness, Past/current experiences, Social background, Time since onset of injury/illness/exacerbation, and 3+ comorbidities: Extensive PMH including chronic back and neck pain, OA, osteoporosis, DM, HTN, h/o TIA, diabetic peripheral neuropathy, PVD, headaches, sleep dysfunction, anxiety, R breast cancer s/p lumpectomy 07/31/22, SVT (refer to above problem list and past medical/surgical history for full PMH)   are also affecting patient's functional outcome.   REHAB POTENTIAL: Good  CLINICAL DECISION MAKING: Unstable/unpredictable  EVALUATION COMPLEXITY: High   GOALS: Goals  reviewed with patient? Yes  SHORT TERM GOALS: Target date: 05/01/2023  Patient will be independent with initial HEP to improve outcomes and carryover.  Baseline:  Goal status: MET - 05/01/23 - pt denies any questions or concerns with [place] fall prevention program  2.  Patient will be educated on strategies to decrease risk of falls.  Baseline:  Goal status: MET - 04/24/23  3.  Patient will demonstrate decreased  TUG time to </= 13.5 sec to decrease risk for falls with transitional mobility. Baseline:  Goal status: IN PROGRESS  LONG TERM GOALS: Target date: 05/29/2023  Patient will be independent with advanced/ongoing HEP to facilitate ability to maintain/progress functional gains from skilled physical therapy services. Baseline:  Goal status: IN PROGRESS  2.  Patient will be able to ambulate 600' with or w/o LRAD on variable surfaces with good safety to access community.  Baseline:  Goal status: IN PROGRESS  3.  Patient will be able to step up/down curb safely with or w/o LRAD for safety with community ambulation.  Baseline:  Goal status: IN PROGRESS   4.  Patient will demonstrate improved B LE strength to >/= 4/5 for improved stability and ease of mobility. Baseline: Refer to above LE MMT table Goal status: IN PROGRESS  5.  Patient will improve 5x STS time to </= 15 seconds for improved efficiency and safety with transfers. Baseline: 30.43 sec (04/09/23) Goal status: IN PROGRESS   6.  Patient will demonstrate gait speed of >/= 2.62 ft/sec (0.8 m/s) to be a safe community ambulator with decreased risk for recurrent falls.  Baseline: 2.36 ft/sec (04/09/23) Goal status: IN PROGRESS  7.  Patient will improve Berg score by at least 8 points to improve safety and stability with ADLs in standing and reduce risk for falls.  Baseline: 44/56 (04/09/23) Goal status: IN PROGRESS  8.  Patient will demonstrate at least 19/24 on DGI or 19/30 on FGA to improve gait stability and reduce risk for  falls. Baseline: 15/24 (04/09/23) Goal status: IN PROGRESS  9. Patient will report >/= 50% on ABC scale to demonstrate improved balance confidence with functional mobility and gait. Baseline: 580 / 1600 = 36.3 % Goal status: IN PROGRESS   PLAN:  PT FREQUENCY: 2x/week  PT DURATION: 8 weeks  PLANNED INTERVENTIONS: 97164- PT Re-evaluation, 97110-Therapeutic exercises, 97530- Therapeutic activity, 97112- Neuromuscular re-education, 97535- Self Care, 16109- Manual therapy, [phone]- Gait training, [phone]- Canalith repositioning, [id number]- Electrical stimulation (unattended), 97035- Ultrasound, 29562- Ionotophoresis 4mg /ml Dexamethasone, Patient/Family education, Balance training, Stair training, Taping, Dry Needling, Joint mobilization, Spinal mobilization, Vestibular training, Cryotherapy, Moist heat, and 97750- Physical performance test or measurement  PLAN FOR NEXT SESSION: Update [facility] Medicare smart phrase and request reauthorization; reassess TUG STG; review [place] fall prevention program PRN; LE strengthening; balance training   Marry Guan, PT 05/01/2023, 8:48 PM    Date of referral: 01/16/2023 Referring provider: [name], MD Referring diagnosis?  M79.10 (ICD-10-CM) - Myalgia  R26.81 (ICD-10-CM) - Unsteady gait  R53.1 (ICD-10-CM) - Weakness  Treatment diagnosis? (if different than referring diagnosis)  Muscle weakness (generalized)  Other abnormalities of gait and mobility  Unsteadiness on feet  Pain in left hip  What was this (referring dx) caused by? Ongoing Issue and Other: Sequelae of treatments for breast cancer  Nature of Condition: Chronic (continuous duration > 3 months)   Laterality: Lt  Current Functional Measure Score: Other ABC scale = 580 / 1600 = 36.3 %  Objective measurements identify impairments when they are compared to normal values, the uninvolved extremity, and prior level of function.  [x]  Yes  []  No  Objective assessment of functional ability:  Severe functional limitations   Briefly describe symptoms: Patient presents with physical impairments of L anterior lateral hip pain, impaired activity tolerance, global UE/LE weakness, dizziness, impaired standing balance, impaired ambulation, inability to navigate stairs and decreased safety awareness impacting safe and independent functional mobility.  How did symptoms start: Gradual onset exacerbated by treatment for breast cancer starting May 2024  Average pain intensity:  Last 24 hours: 4-5/10  Past week: up to 8-9/10  How often does the pt experience symptoms? Constantly  How much have the symptoms interfered with usual daily activities? Extremely  How has condition changed since care began at this facility? NA - initial visit  In general, how is the patients overall health? Fair  Onset date: Chronic   BACK PAIN (STarT Back Screening Tool) - (When applicable): N/A  Has your back pain spread down your leg(s) at sometime in the last 2 weeks? []  Yes   []  No Have you had pain in the shoulder or neck at sometime in the past 2 weeks? []  Yes   []  No Have you only walked short distances because of your back pain? []  Yes   []  No In the past 2 weeks, have you dressed more slowly than usual because of your back pain? []  Yes   []  No Do you think it is not really safe for person with a condition like yours to be physically active? []  Yes   []  No Have worrying thoughts been going through your mind a lot of the time? []  Yes   []  No Do you feel that your back pain is terrible and it is never going to get any better? []  Yes   []  No In general, have you stopped enjoying all the things you usually enjoy? []  Yes   []  No Overall, how bothersome has your back pain been in the last 2 weeks? []  Not at all   []  Slightly     []  Moderate   []  Very much     []  Extremely

## 2023-05-03 ENCOUNTER — Encounter: Payer: Self-pay | Admitting: Physical Therapy

## 2023-05-03 ENCOUNTER — Ambulatory Visit: Admitting: Physical Therapy

## 2023-05-03 DIAGNOSIS — R2681 Unsteadiness on feet: Secondary | ICD-10-CM

## 2023-05-03 DIAGNOSIS — M25552 Pain in left hip: Secondary | ICD-10-CM | POA: Diagnosis not present

## 2023-05-03 DIAGNOSIS — R2689 Other abnormalities of gait and mobility: Secondary | ICD-10-CM

## 2023-05-03 DIAGNOSIS — M6281 Muscle weakness (generalized): Secondary | ICD-10-CM

## 2023-05-03 NOTE — Therapy (Addendum)
 OUTPATIENT PHYSICAL THERAPY TREATMENT   Patient Name: [name] MRN: [id number] DOB:06-11-1940, 83 y.o., female Today's Date: 05/03/2023   END OF SESSION:  PT End of Session - 05/03/23 1444     Visit Number 6    Date for PT Re-Evaluation 05/29/23    Authorization Type [employer] Medicare    Authorization Time Period 04/03/23 - 05/29/23    Authorization - Visit Number 6    Authorization - Number of Visits 6    PT Start Time 1442    PT Stop Time 1527    PT Time Calculation (min) 45 min    Activity Tolerance Patient tolerated treatment well;Patient limited by fatigue    Behavior During Therapy [facility] for tasks assessed/performed                Past Medical History:  Diagnosis Date   Abdominal pain in female 03/18/2010   Qualifier: Diagnosis of  By: Leone Payor MD, Alfonse Ras E    Anemia 06/08/2014   Anxiety    Arthritis    Spinal Osteoarthritis   Breast cancer (HCC)    Carcinoid tumor of stomach    Cataract    Chest pain    Myoview 12/15 no ischemia.   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 09/05/2006   Qualifier: Diagnosis of  By: Charlsie Quest RMA, Lucy     Diabetic peripheral neuropathy (HCC) 10/29/2013   Encounter for preventative adult health care exam with abnormal findings 09/14/2013   Esophageal reflux    Gastric polyp    Fundic Gland   Gastroparesis    Headache(784.0)    Heart murmur    Echocardiogram 2/11: EF 60-65%, mild LAE, grade 1 diastolic dysfunction, aortic valve sclerosis, mean gradient 9 mm of mercury, PASP 34   Hematuria 03/16/2016   Iron deficiency anemia, unspecified    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   Leg swelling    bilateral   Neck pain 04/22/2015   PONV (postoperative nausea and vomiting)    pt states body temperature drops every time she has anesthesia; pt states only needs small amount of anesthesia   PSVT (paroxysmal supraventricular tachycardia) (HCC)     Pure hypercholesterolemia    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   Renal insufficiency 06/18/2019   pt states  L kidney function very low-functions at about 20%   Stroke [facility])    tia, 2014   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   Type II or unspecified type diabetes mellitus without mention of complication, not stated as uncontrolled    Unspecified essential hypertension    Unspecified hereditary and idiopathic peripheral neuropathy 10/29/2013   Vitamin D deficiency    Past Surgical History:  Procedure Laterality Date   BREAST BIOPSY Right 06/13/2022   [place] RT BREAST BX W LOC DEV 1ST LESION IMG BX SPEC US GUIDE 06/13/2022 [facility] MAMMOGRAPHY   BREAST LUMPECTOMY Right 07/31/2022   Procedure: RIGHT BREAST LUMPECTOMY;  Surgeon: Abigail Miyamoto, MD;  Location: [facility];  Service: General;  Laterality: Right;   CHOLECYSTECTOMY  1993   COLONOSCOPY  11/11/2010   diverticulosis   DILATATION & CURRETTAGE/HYSTEROSCOPY WITH RESECTOCOPE N/A 02/25/2013   Procedure: Attempted hysteroscopy with uterine perforation;  Surgeon: Jacqualin Combes de Gwenevere Ghazi, MD;  Location: [facility];  Service: Gynecology;  Laterality: N/A;   ESOPHAGOGASTRODUODENOSCOPY  08/29/2010; 09/15/2010   Carcinoid tumor less than 1 cm in July 2012 not seen in  August 2012 , gastritis, fundic gland polyps   ESOPHAGOGASTRODUODENOSCOPY  05/16/2011   ESOPHAGOGASTRODUODENOSCOPY  06/14/2012   EUS  12/15/2010   Procedure: UPPER ENDOSCOPIC ULTRASOUND (EUS) LINEAR;  Surgeon: Rob Bunting, MD;  Location: [facility];  Service: Endoscopy;  Laterality: N/A;   EYE SURGERY Bilateral    Bi lateral cateracts and bi lateral laser   LAPAROSCOPY N/A 02/25/2013   Procedure: Cystoscopy and laparoscopy with fulguration of uterine serosa;  Surgeon: Jacqualin Combes de Gwenevere Ghazi, MD;  Location: [facility];  Service: Gynecology;  Laterality: N/A;   TONSILLECTOMY     Patient Active Problem List   Diagnosis Date Noted   Precordial chest pain 12/24/2022   Carcinoma  of breast upper outer quadrant, right (HCC) 07/04/2022   SVT (supraventricular tachycardia) (HCC) 06/21/2022   Left-sided weakness 04/02/2022   Unsteady gait 04/02/2022   Oral lesion 03/08/2021   Sun-damaged skin 03/08/2021   Thiamine deficiency 11/01/2020   Trigeminal neuralgia 08/21/2020   Pedal edema 08/21/2020   Chronic left-sided back pain 03/28/2020   Nocturia 03/28/2020   Left-sided headache 03/28/2020   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   Renal insufficiency 06/18/2019   Educated about COVID-19 virus infection 05/15/2019   Atrophic vaginitis 01/20/2019   Stenosis of carotid artery 11/12/2018   Anxiety 10/13/2018   Referred otalgia of left ear 04/23/2018   Chronic throat clearing 04/23/2018   Sinusitis 10/11/2017   Headache 07/12/2017   Dizzy spells 11/21/2016   Constipation 11/21/2016   Nonrheumatic aortic valve stenosis 05/23/2016   Aortic atherosclerosis (HCC) 05/23/2016   Hematuria 03/16/2016   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   Pain of upper abdomen 06/06/2015   Neck pain 04/22/2015   Ear pain 02/28/2015   Abnormal urine 11/21/2014   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/10/2014   Anemia 06/08/2014   RLS (restless legs syndrome) 11/23/2013   Diabetic peripheral neuropathy (HCC) 10/29/2013   Left-sided thoracic back pain 10/06/2013   Encounter for preventative adult health care exam with abnormal findings 09/14/2013   Iron deficiency anemia    Status post laparoscopy 02/25/2013   Hyponatremia 01/09/2013   GERD (gastroesophageal reflux disease) 01/09/2013   Amaurosis fugax of left eye 10/16/2012   Low back pain 06/03/2012   Vitamin D deficiency 03/11/2012   Bilateral hand pain 10/20/2011   Encounter for long-term (current) use of other medications 10/20/2011   IBS (irritable bowel syndrome) 08/14/2011   TIA (transient ischemic attack) 02/10/2011   Abnormal brain CT 01/19/2011   Allergic rhinitis 10/01/2010   Carcinoid tumor of stomach- history of  09/29/2010   Preventative health care 07/15/2010   FUNDIC GLAND POLYPS OF STOMACH 03/18/2010   Abdominal pain in female 03/18/2010   Left hip pain 03/17/2009   SYSTOLIC MURMUR 03/02/2009   Paroxysmal supraventricular tachycardia (HCC) 01/12/2009   PLANTAR FASCIITIS 06/08/2008   CHEST PAIN 05/18/2008   Gastroparesis 12/18/2007   HYPERCHOLESTEROLEMIA 06/11/2007   Type 2 diabetes mellitus with obesity (HCC) 09/05/2006   Essential hypertension 09/05/2006    PCP: [name], MD   REFERRING PROVIDER: [name], MD   REFERRING DIAG:  M79.10 (ICD-10-CM) - Myalgia  R26.81 (ICD-10-CM) - Unsteady gait  R53.1 (ICD-10-CM) - Weakness   THERAPY DIAG:  Muscle weakness (generalized)  Other abnormalities of gait and mobility  Unsteadiness on feet  Pain in left hip  RATIONALE FOR EVALUATION AND TREATMENT: Rehabilitation  ONSET DATE: Worsening over the past year, especially since breast cancer diagnosis and May 2024  NEXT MD VISIT:  04/23/2023   SUBJECTIVE:                                                                                                                                                                                                         SUBJECTIVE STATEMENT: Pt reports that she is having pain in shoulders and achy LE muscles.  EVAL: Pt reports her weakness and pains seems to be exacerbated by recent meds she has had to take. She notes difficulty climbing stairs and does not feel safe using a step stool to reach high things.  She feels like she has no energy.  She reports chronic belly pain, maybe from meds vs diverticulitis.  L hip pain has been an issue for several months but patient has been told there is no "bony" reason for her pain (x-rays negative).  PAIN: Are you having pain? Yes: NPRS scale: 8/10  Pain location: L lateral hip Pain description: dull, sharp when more intense  Aggravating factors: lying on side to sleep (worse when lying on L than R),  first moving in the morning Relieving factors: walking, Tylenol   Are you having pain? Yes: NPRS scale: 7/10 Pain location: B upper shoulders Pain description: pinching Aggravating factors: uncertain Relieving factors: keep moving  PERTINENT HISTORY:  Extensive PMH including chronic back and neck pain, OA, osteoporosis, DM, HTN, h/o TIA, diabetic peripheral neuropathy, PVD, headaches, sleep dysfunction, anxiety, R breast cancer s/p lumpectomy 07/31/22, SVT (refer to above problem list and past medical/surgical history for full PMH)   PRECAUTIONS: Fall  RED FLAGS: None  WEIGHT BEARING RESTRICTIONS: No  FALLS:  Has patient fallen in last 6 months? No - admits to a few close calls due to dizziness/lightheadness  LIVING ENVIRONMENT: Lives with: lives alone  Lives in: House/apartment Stairs: No Has following equipment at home: Single point cane, Environmental consultant - 4 wheeled, shower chair, and Grab bars  OCCUPATION: Retired  PLOF: Independent, Needs assistance with homemaking, Leisure: read and play card games on her phone, traveling, and housekeeping/cleaning service ~1x/month    PATIENT GOALS: "To be able to walk steady and feel comfortable going up stairs so I can visit my niece and nephew. To be able to carry groceries."   OBJECTIVE: (objective measures completed at initial evaluation unless otherwise dated)  DIAGNOSTIC FINDINGS:  01/22/23 & 10/05/22 - DH Left hip (2 several months of L hip pain) IMPRESSION: Negative.  COGNITION: Overall cognitive status: Within functional limits for tasks assessed   SENSATION: Numbness and tingling in hands and feet from DM and cervical radiculopathy  POSTURE:  rounded shoulders, forward head, increased thoracic kyphosis, and flexed trunk   PALPATION: Increased TTP over L TFL, hip flexors and abductors  UPPER EXTREMITY MMT:  MMT Right eval Left eval  Shoulder flexion 4- 4-  Shoulder extension 3+ 3+  Shoulder abduction 4- 4-  Shoulder  adduction    Shoulder internal rotation 4- 4-  Shoulder external rotation 3+ 3+  Middle trapezius    Lower trapezius     (Blank rows = not tested)  MUSCLE LENGTH: Hamstrings: mod tight B ITB: mod tight B  Piriformis: severe tight B Hip flexors: mild/mod tight L>R  Quads: mild/mod tight L>R  Heelcord: mod tight B  LOWER EXTREMITY ROM:    Mildly limited primarily due to muscle tightness  LOWER EXTREMITY MMT:  (tested in sitting on eval)  MMT Right eval Left eval  Hip flexion 3+ 3+  Hip extension 3- 3-  Hip abduction 3+ 3+  Hip adduction 3+ 3+  Hip internal rotation 3- 3-  Hip external rotation 3- 3-  Knee flexion 4- 4-  Knee extension 4- 4  Ankle dorsiflexion 3+ 4-  Ankle plantarflexion    Ankle inversion    Ankle eversion    (Blank rows = not tested)  BED MOBILITY:  Sit to supine Min A Supine to sit Min A Rolling to Right SBA  TRANSFERS: Assistive device utilized: None  Sit to stand: Complete Independence Stand to sit: Complete Independence Chair to chair: Modified independence Floor:  NT  GAIT: Distance walked: Clinic distances Assistive device utilized: None Level of assistance: Complete Independence and SBA Gait pattern: decreased stride length and trunk flexed Comments: Decreased gait speed  RAMP: Level of Assistance:  NT Assistive device utilized:    Ramp Comments:   CURB:  Level of Assistance:  NT Assistive device utilized:    Curb Comments:   STAIRS: Level of Assistance: SBA Stair Negotiation Technique: Step to Pattern, Sideways (descent), Forwards (ascent) with Single Rail on Right Number of Stairs: 14  Height of Stairs: 7"  Comments: forward for ascent, sideways leading with R foot for descent    FUNCTIONAL TESTS: (TBA next visit) 5 times sit to stand: 30.43 sec; >15 sec indicates a risk for recurrent falls  Timed up and go (TUG): 16.03 sec; >13.5 sec indicates a high risk for falls  10 meter walk test: 13.91 sec ; Gait speed = 2.36  ft/sec; >/=2.62 ft/sec is necessary to be considered a Materials engineer Scale: 44/56; Scores of 37-45 indicate significant risk for falls (>80%)  Dynamic Gait Index: 15/24; Scores of 19 or less are predictive of falls in older community living adults.    05/03/23   TUG: 13.5 sec  PATIENT SURVEYS:  ABC scale 580 / 1600 = 36.3 % , scores of <50% indicating a low level of physical functioning and scores of <67% indicating a risk for falls in patients with vestibular disorders   TODAY'S TREATMENT:   05/03/23 THERAPEUTIC EXERCISE: To improve strength and endurance.  Demonstration, verbal and tactile cues throughout for technique.  NuStep - L4 x 6 min (UE/LE) Seated LAQ - limited by posterior LE pain, re-attempted after HS stretches with a little better tolerance x 5 bil Seated hip hinge HS stretch 5 x ~5"  Seated RTB B hip ABD/ER clam x 10 Seated RTB alt hip flexion march x 10 Bridge 2 x 10, 2nd set with looped YTB hip ABD isometric  Standing alt hip extension x 10, UE support on back  of chair for balance Seated Fitter leg press (1 black) x 10 bil  THERAPEUTIC ACTIVITIES: To improve functional performance.  Demonstration, verbal and tactile cues throughout for technique. TUG: 13.5 sec   05/01/2023  THERAPEUTIC EXERCISE: To improve strength and endurance.  Demonstration, verbal and tactile cues throughout for technique.  NuStep - L4 x 6 min (UE/LE) Seated RTB B hip ABD/ER clam 2 x 10 Seated RTB alt hip flexion march 2 x 10 Sit to stand + RTB hip ABD isometric (looped band at knees) 1 x 5, 1 x 6 Seated RTB HS curls x 10 bil  Seated Fitter leg press (1 black) x 10 bil  NEUROMUSCULAR RE-EDUCATION: To improve kinesthesia and posture. Seated YTB scap retraction + B shoulder row x 10 Seated YTB scap retraction + B shoulder extension x 10 Seated YTB pallof press x 10 bil   04/26/2023  NEUROMUSCULAR RE-EDUCATION: To improve balance, coordination, kinesthesia, posture,  proprioception, and reduce fall risk.  [place] Fall Prevention Program: Balance: Backwards walking 4 x 10 steps (no support) Walking and turning around (figure 8 pattern) x 3 Sideways walking 2  x 10 steps each direction (no support) Heel toe standing (tandem stance) x 10 sec each side (no support) Heel toe walking (tandem gait) 4 x 10 steps (no support) One leg stand x 10 sec each side (hold support) Heel walking 4 x 10 steps (hold support) Toe walking 4 x 10 steps (hold support) Heel toe backwards walking 4 x 10 steps (hold support) Sit to stands with no hands support x 10 Stair walking with 1 HR - deferred   PATIENT EDUCATION:  Education details: continue with current HEP and [employer]  Person educated: Patient Education method: Explanation Education comprehension: verbalized understanding  HOME EXERCISE PROGRAM: Access Code: [email or web address] URL: [email or web address] Date: 04/24/2023 Prepared by: Glenetta Hew  Patient Education - Check for Safety - [place] Program   ASSESSMENT:  CLINICAL IMPRESSION: Alda continues to reports increased pain and achy muscles all over. She states that her [place] fall prevention program HEP is going well and doesn't need any further review but was very vague on frequency of home performance. Focused on LE strengthening from supine and sitting positions but had to modify exercises due to increased pain in lateral knee/leg. Has shown improvement in transitional mobility by decreasing TUG time by 3 seconds, meeting STG #3. Mentioned R lateral hip/thigh pain during exercise but managed to push through with no further issues. Kikue will benefit from continued skilled PT to improve mobility and ongoing strength deficits to reach functional mobility and independence.  OBJECTIVE IMPAIRMENTS: Abnormal gait, decreased activity tolerance, decreased balance, decreased coordination, decreased endurance, decreased knowledge of  condition, decreased knowledge of use of DME, decreased mobility, difficulty walking, decreased ROM, decreased strength, decreased safety awareness, dizziness, increased fascial restrictions, impaired perceived functional ability, increased muscle spasms, impaired flexibility, impaired sensation, impaired UE functional use, improper body mechanics, postural dysfunction, and pain.   ACTIVITY LIMITATIONS: carrying, lifting, bending, standing, squatting, sleeping, stairs, transfers, bed mobility, and locomotion level  PARTICIPATION LIMITATIONS: meal prep, cleaning, laundry, driving, shopping, community activity, and church  PERSONAL FACTORS: Age, Fitness, Past/current experiences, Social background, Time since onset of injury/illness/exacerbation, and 3+ comorbidities: Extensive PMH including chronic back and neck pain, OA, osteoporosis, DM, HTN, h/o TIA, diabetic peripheral neuropathy, PVD, headaches, sleep dysfunction, anxiety, R breast cancer s/p lumpectomy 07/31/22, SVT (refer to above problem list and past medical/surgical history for full PMH)   are also affecting patient's  functional outcome.   REHAB POTENTIAL: Good  CLINICAL DECISION MAKING: Unstable/unpredictable  EVALUATION COMPLEXITY: High   GOALS: Goals reviewed with patient? Yes  SHORT TERM GOALS: Target date: 05/01/2023  Patient will be independent with initial HEP to improve outcomes and carryover.  Baseline:  Goal status: MET - 05/01/23 - pt denies any questions or concerns with [place] fall prevention program  2.  Patient will be educated on strategies to decrease risk of falls.  Baseline:  Goal status: MET - 04/24/23  3.  Patient will demonstrate decreased TUG time to </= 13.5 sec to decrease risk for falls with transitional mobility. Baseline:  Goal status: MET - 13.5 sec  LONG TERM GOALS: Target date: 05/29/2023  Patient will be independent with advanced/ongoing HEP to facilitate ability to maintain/progress functional  gains from skilled physical therapy services. Baseline:  Goal status: IN PROGRESS  2.  Patient will be able to ambulate 600' with or w/o LRAD on variable surfaces with good safety to access community.  Baseline:  Goal status: IN PROGRESS  3.  Patient will be able to step up/down curb safely with or w/o LRAD for safety with community ambulation.  Baseline:  Goal status: IN PROGRESS   4.  Patient will demonstrate improved B LE strength to >/= 4/5 for improved stability and ease of mobility. Baseline: Refer to above LE MMT table Goal status: IN PROGRESS  5.  Patient will improve 5x STS time to </= 15 seconds for improved efficiency and safety with transfers. Baseline: 30.43 sec (04/09/23) Goal status: IN PROGRESS   6.  Patient will demonstrate gait speed of >/= 2.62 ft/sec (0.8 m/s) to be a safe community ambulator with decreased risk for recurrent falls.  Baseline: 2.36 ft/sec (04/09/23) Goal status: IN PROGRESS  7.  Patient will improve Berg score by at least 8 points to improve safety and stability with ADLs in standing and reduce risk for falls.  Baseline: 44/56 (04/09/23) Goal status: IN PROGRESS  8.  Patient will demonstrate at least 19/24 on DGI or 19/30 on FGA to improve gait stability and reduce risk for falls. Baseline: 15/24 (04/09/23) Goal status: IN PROGRESS  9. Patient will report >/= 50% on ABC scale to demonstrate improved balance confidence with functional mobility and gait. Baseline: 580 / 1600 = 36.3 % Goal status: IN PROGRESS   PLAN:  PT FREQUENCY: 2x/week  PT DURATION: 8 weeks  PLANNED INTERVENTIONS: 97164- PT Re-evaluation, 97110-Therapeutic exercises, 97530- Therapeutic activity, 97112- Neuromuscular re-education, 97535- Self Care, 16109- Manual therapy, [phone]- Gait training, [phone]- Canalith repositioning, [id number]- Electrical stimulation (unattended), 97035- Ultrasound, 29562- Ionotophoresis 4mg /ml Dexamethasone, Patient/Family education, Balance training, Stair  training, Taping, Dry Needling, Joint mobilization, Spinal mobilization, Vestibular training, Cryotherapy, Moist heat, and 97750- Physical performance test or measurement  PLAN FOR NEXT SESSION: Review [place] fall prevention program PRN; LE strengthening; balance training   Marcelline Mates, Student-PT 05/03/2023, 3:29 PM    Date of referral: 01/16/2023 Referring provider: [name], MD Referring diagnosis?  M79.10 (ICD-10-CM) - Myalgia  R26.81 (ICD-10-CM) - Unsteady gait  R53.1 (ICD-10-CM) - Weakness  Treatment diagnosis? (if different than referring diagnosis)  No diagnosis found.  What was this (referring dx) caused by? Ongoing Issue and Other: Sequelae of treatments for breast cancer  Nature of Condition: Chronic (continuous duration > 3 months)   Laterality: Lt  Current Functional Measure Score: Other ABC scale = 580 / 1600 = 36.3 %  Objective measurements identify impairments when they are compared to normal values, the  uninvolved extremity, and prior level of function.  [x]  Yes  []  No  Objective assessment of functional ability: Severe functional limitations   Briefly describe symptoms: Patient presents with physical impairments of L anterior lateral hip pain, impaired activity tolerance, global UE/LE weakness, dizziness, impaired standing balance, impaired ambulation, inability to navigate stairs and decreased safety awareness impacting safe and independent functional mobility.    How did symptoms start: Gradual onset exacerbated by treatment for breast cancer starting May 2024  Average pain intensity:  Last 24 hours: 6/10  Past week: up to 6-10/10  How often does the pt experience symptoms? Constantly  How much have the symptoms interfered with usual daily activities? Extremely  How has condition changed since care began at this facility? A little better  In general, how is the patients overall health? Fair  Onset date: Chronic   BACK PAIN (STarT  Back Screening Tool) - (When applicable): N/A  Has your back pain spread down your leg(s) at sometime in the last 2 weeks? []  Yes   []  No Have you had pain in the shoulder or neck at sometime in the past 2 weeks? []  Yes   []  No Have you only walked short distances because of your back pain? []  Yes   []  No In the past 2 weeks, have you dressed more slowly than usual because of your back pain? []  Yes   []  No Do you think it is not really safe for person with a condition like yours to be physically active? []  Yes   []  No Have worrying thoughts been going through your mind a lot of the time? []  Yes   []  No Do you feel that your back pain is terrible and it is never going to get any better? []  Yes   []  No In general, have you stopped enjoying all the things you usually enjoy? []  Yes   []  No Overall, how bothersome has your back pain been in the last 2 weeks? []  Not at all   []  Slightly     []  Moderate   []  Very much     []  Extremely

## 2023-05-07 ENCOUNTER — Ambulatory Visit: Payer: Medicare Other | Admitting: Internal Medicine

## 2023-05-07 ENCOUNTER — Encounter: Payer: Self-pay | Admitting: Internal Medicine

## 2023-05-07 VITALS — BP 130/60 | HR 64 | Ht 61.0 in | Wt 166.0 lb

## 2023-05-07 DIAGNOSIS — K5730 Diverticulosis of large intestine without perforation or abscess without bleeding: Secondary | ICD-10-CM

## 2023-05-07 DIAGNOSIS — K581 Irritable bowel syndrome with constipation: Secondary | ICD-10-CM

## 2023-05-07 DIAGNOSIS — K3184 Gastroparesis: Secondary | ICD-10-CM

## 2023-05-07 DIAGNOSIS — R109 Unspecified abdominal pain: Secondary | ICD-10-CM | POA: Diagnosis not present

## 2023-05-07 DIAGNOSIS — D131 Benign neoplasm of stomach: Secondary | ICD-10-CM | POA: Diagnosis not present

## 2023-05-07 DIAGNOSIS — G8929 Other chronic pain: Secondary | ICD-10-CM

## 2023-05-07 DIAGNOSIS — K30 Functional dyspepsia: Secondary | ICD-10-CM | POA: Diagnosis not present

## 2023-05-07 MED ORDER — PANTOPRAZOLE SODIUM 40 MG PO TBEC: 40.0000 mg | DELAYED_RELEASE_TABLET | Freq: Every day | ORAL | 3 refills | Status: DC

## 2023-05-07 NOTE — Patient Instructions (Addendum)
 Stop your esomeprazole and start pantoprazole. The rx has been sent to your mail order pharmacy. Take the pantoprazole at least 30 minutes prior to eating.   Take over the counter Metamucil : 1 tablespoon daily and may increase to 2 tablespoons as needed to promote better bowel movements. Drink plenty of water.  _______________________________________________________  If your blood pressure at your visit was 140/90 or greater, please contact your primary care physician to follow up on this.  _______________________________________________________  If you are age 90 or older, your body mass index should be between 23-30. Your Body mass index is 31.37 kg/m. If this is out of the aforementioned range listed, please consider follow up with your Primary Care Provider.  If you are age 7 or younger, your body mass index should be between 19-25. Your Body mass index is 31.37 kg/m. If this is out of the aformentioned range listed, please consider follow up with your Primary Care Provider.   ________________________________________________________  The [facility] providers would like to encourage you to use [facility] to communicate with providers for non-urgent requests or questions.  Due to long hold times on the telephone, sending your provider a message by [facility] may be a faster and more efficient way to get a response.  Please allow 48 business hours for a response.  Please remember that this is for non-urgent requests.  _______________________________________________________  I appreciate the opportunity to care for you. Stan Head, MD, [facility]

## 2023-05-07 NOTE — Progress Notes (Signed)
 [name] 83 y.o. [birth year] [id number]  Assessment & Plan:   Encounter Diagnoses  Name Primary?   Irritable bowel syndrome with constipation Yes   Diverticulosis of colon without hemorrhage    Chronic abdominal pain    Functional dyspepsia    Gastroparesis    Benign fundic gland polyps of stomach    She is improved.  Reviewed that she has not had radiographic evidence of diverticulitis.  Question if there is some relationship to anastrozole.  She plans to review further with her oncologist.  She is to take metamucil 1 tbsp every day increase to 2 if necessary to promote better defecation. Reviewed the need for adequate fluid intake she believes she drinks plenty of water. Continue dicyclomine as needed.  Stomach was thickened on CT scan could be underdistention, she has had this issue before and she has numerous benign fundic gland polyps of the stomach.  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  She feels her dyspeptic symptoms are under control.  We elected not to pursue a repeat EGD.  CC: [name], MD   Subjective:   Chief Complaint: Abdominal pain, review CT results  HPI 83 year old woman with a history of hyperplastic and fundic gland polyps, gastroparesis, diverticulitis, IBS, and a remote history of a benign carcinoid tumor of the stomach who presents with complaints of left lower quadrant pain that radiates down into the groin and in the back. She actually also has chronic abdominal pain issues.  Last colonoscopy 2012, severe diverticulosis throughout. Not repeated due to age and lack of polyps.    Wt Readings from Last 3 Encounters:  05/07/23 166 lb (75.3 kg)  04/23/23 165 lb 9.6 oz (75.1 kg)  03/09/23 163 lb (73.9 kg)  Weights very stable x 4 +years   Last seen [facility] by the [facility] NP with left lower quadrant pain radiating to the left side and leg like pulling her insides apart.  Constipation was a  problem and defecation relieved symptoms somewhat.  She was using MiraLAX but not daily because it would cause abdominal pain and cramping if taken frequently.  Gassing and bloating as well.  She was asking about going back on dicyclomine.  A CT scan was ordered:  CT abdomen and pelvis with contrast 03/29/2023 IMPRESSION: Colonic diverticulosis, without radiographic evidence of diverticulitis or other acute findings.   Wall thickening of gastric body. This may be due to incomplete gastric distention, although gastritis or infiltrative gastropathies cannot be excluded. Consider upper endoscopy for further evaluation.     She reports that the worst of the pain is over though she still has some lower abdominal pain.  She is not experiencing her dyspeptic symptoms so much because she watches her diet.  She is having difficulty producing a bowel movement at times.  Feels like she has incomplete defecation.  There is some nocturia but no urinary incontinence reported.  She has used Benefiber or psyllium intermittently it does not sound like she has been consistent with that.  She believes that her aromatase inhibitor anastrozole has contributed to abdominal pain and she has a niece who is a PhD and came up with studies that suggest it might be linked to diverticulitis.  She has not had diverticulitis on recent imaging as above.   Allergies  Allergen Reactions   Tramadol Other (See Comments)    Dizziness    Current Meds  Medication Sig   acetaminophen (TYLENOL) 325 MG  tablet Take 2 tablets (650 mg total) by mouth every 6 (six) hours as needed for up to 30 doses for mild pain or moderate pain.   amLODipine (NORVASC) 5 MG tablet TAKE 1 TABLET BY MOUTH DAILY   anastrozole (ARIMIDEX) 1 MG tablet Take 1 tablet (1 mg total) by mouth daily.   aspirin EC 81 MG tablet Take 81 mg by mouth daily. Swallow whole.   betamethasone valerate ointment (VALISONE) 0.1 % Apply a small amount to affected area topically  every other day.   Blood Glucose Monitoring Suppl (ONE TOUCH ULTRA 2) w/Device KIT USE TO CHECK BLOOD SUGAR AS DIRECTED   calcium-vitamin D (OSCAL WITH D) 500-5 MG-MCG tablet Take 1 tablet by mouth daily.   cholecalciferol (VITAMIN D) 1000 UNITS tablet Take 1,000 Units by mouth daily.   dicyclomine (BENTYL) 10 MG capsule Take 1 capsule (10 mg total) by mouth as needed for spasms.   esomeprazole (NEXIUM) 40 MG capsule TAKE 1 CAPSULE BY MOUTH DAILY AT 12 NOON   famotidine (PEPCID) 40 MG tablet Take 1 tablet (40 mg total) by mouth daily.   furosemide (LASIX) 20 MG tablet TAKE 1 TABLET BY MOUTH DAILY AS  NEEDED   hyoscyamine (LEVSIN SL) 0.125 MG SL tablet Place 1 tablet (0.125 mg total) under the tongue every 4 (four) hours as needed.   Lancets (ONETOUCH ULTRASOFT) lancets USE AS DIRECTED 3 TIMES  DAILY   losartan (COZAAR) 50 MG tablet TAKE 1 TABLET BY MOUTH TWICE  DAILY   metFORMIN (GLUCOPHAGE-XR) 500 MG 24 hr tablet Take 1 tablet (500 mg total) by mouth daily.   metoCLOPramide (REGLAN) 5 MG tablet TAKE 1 TABLET BY MOUTH TWICE  DAILY AS NEEDED   metoprolol tartrate (LOPRESSOR) 50 MG tablet TAKE 1 TABLET BY MOUTH TWICE  DAILY   Multiple Vitamin (MULTIVITAMIN ADULT PO) Take by mouth.   ondansetron (ZOFRAN) 4 MG tablet Take 1 tablet (4 mg total) by mouth every 6 (six) hours as needed.   ONETOUCH ULTRA test strip CHECK BLOOD SUGAR 3 TIMES DAILY  OR AS NEEDED   potassium chloride (KLOR-CON M) 10 MEQ tablet TAKE 1 TABLET BY MOUTH DAILY   rosuvastatin (CRESTOR) 20 MG tablet Take 1 tablet (20 mg total) by mouth daily.   thiamine (VITAMIN B-1) 100 MG tablet Take 100 mg by mouth every other day.   vitamin E 180 MG (400 UNITS) capsule Take 400 Units by mouth daily.   Past Medical History:  Diagnosis Date   Abdominal pain in female 03/18/2010   Qualifier: Diagnosis of  By: Leone Payor MD, Alfonse Ras E    Anemia 06/08/2014   Anxiety    Arthritis    Spinal Osteoarthritis   Breast cancer (HCC)    Carcinoid  tumor of stomach    Cataract    Chest pain    Myoview 12/15 no ischemia.   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 09/05/2006   Qualifier: Diagnosis of  By: Charlsie Quest RMA, Lucy     Diabetic peripheral neuropathy (HCC) 10/29/2013   Encounter for preventative adult health care exam with abnormal findings 09/14/2013   Esophageal reflux    Gastric polyp    Fundic Gland   Gastroparesis    Headache(784.0)    Heart murmur    Echocardiogram 2/11: EF 60-65%, mild LAE, grade 1 diastolic dysfunction, aortic valve sclerosis, mean gradient 9 mm of mercury, PASP 34  Hematuria 03/16/2016   Iron deficiency anemia, unspecified    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   Leg swelling    bilateral   Neck pain 04/22/2015   PONV (postoperative nausea and vomiting)    pt states body temperature drops every time she has anesthesia; pt states only needs small amount of anesthesia   PSVT (paroxysmal supraventricular tachycardia) (HCC)    Pure hypercholesterolemia    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   Renal insufficiency 06/18/2019   pt states  L kidney function very low-functions at about 20%   Stroke [facility])    tia, 2014   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   Type II or unspecified type diabetes mellitus without mention of complication, not stated as uncontrolled    Unspecified essential hypertension    Unspecified hereditary and idiopathic peripheral neuropathy 10/29/2013   Vitamin D deficiency    Past Surgical History:  Procedure Laterality Date   BREAST BIOPSY Right 06/13/2022   [place] RT BREAST BX W LOC DEV 1ST LESION IMG BX SPEC US GUIDE 06/13/2022 [facility]   BREAST LUMPECTOMY Right 07/31/2022   Procedure: RIGHT BREAST LUMPECTOMY;  Surgeon: Abigail Miyamoto, MD;  Location: [facility];  Service: General;  Laterality: Right;   CHOLECYSTECTOMY  1993   COLONOSCOPY  11/11/2010   diverticulosis   DILATATION &  CURRETTAGE/HYSTEROSCOPY WITH RESECTOCOPE N/A 02/25/2013   Procedure: Attempted hysteroscopy with uterine perforation;  Surgeon: Jacqualin Combes de Gwenevere Ghazi, MD;  Location: [facility];  Service: Gynecology;  Laterality: N/A;   ESOPHAGOGASTRODUODENOSCOPY  08/29/2010; 09/15/2010   Carcinoid tumor less than 1 cm in July 2012 not seen in August 2012 , gastritis, fundic gland polyps   ESOPHAGOGASTRODUODENOSCOPY  05/16/2011   ESOPHAGOGASTRODUODENOSCOPY  06/14/2012   EUS  12/15/2010   Procedure: UPPER ENDOSCOPIC ULTRASOUND (EUS) LINEAR;  Surgeon: Rob Bunting, MD;  Location: [facility];  Service: Endoscopy;  Laterality: N/A;   EYE SURGERY Bilateral    Bi lateral cateracts and bi lateral laser   LAPAROSCOPY N/A 02/25/2013   Procedure: Cystoscopy and laparoscopy with fulguration of uterine serosa;  Surgeon: Jacqualin Combes de Gwenevere Ghazi, MD;  Location: [facility];  Service: Gynecology;  Laterality: N/A;   TONSILLECTOMY     Social History   Social History Narrative   Patient was married Programme researcher, broadcasting/film/video) - widow   Patient does not have any children.   Patient is right-handed.   Patient has a BA degree.   One caffeine drink daily    family history includes Diabetes in her brother, maternal grandmother, mother, sister, and sister; Heart disease in her brother, brother, sister, sister, and sister; Hyperlipidemia in her brother, sister, and sister; Hypertension in her brother, paternal grandmother, sister, and sister; Intellectual disability in her brother; Stroke in her father.   Review of Systems  As per HPI Objective:   Physical Exam BP 130/60   Pulse 64   Ht 5\' 1"  (1.549 m)   Wt 166 lb (75.3 kg)   LMP 02/07/1992   BMI 31.37 kg/m  NAD Abd protuberant and mildly obese, soft and NT BS+  35 minutes total time spent on this visit before during and after patient interaction.

## 2023-05-08 ENCOUNTER — Ambulatory Visit: Attending: Family Medicine | Admitting: Physical Therapy

## 2023-05-08 DIAGNOSIS — R2681 Unsteadiness on feet: Secondary | ICD-10-CM | POA: Insufficient documentation

## 2023-05-08 DIAGNOSIS — M6281 Muscle weakness (generalized): Secondary | ICD-10-CM | POA: Diagnosis not present

## 2023-05-08 DIAGNOSIS — M25552 Pain in left hip: Secondary | ICD-10-CM | POA: Insufficient documentation

## 2023-05-08 DIAGNOSIS — R2689 Other abnormalities of gait and mobility: Secondary | ICD-10-CM | POA: Diagnosis not present

## 2023-05-08 NOTE — Therapy (Signed)
 OUTPATIENT PHYSICAL THERAPY TREATMENT   Patient Name: [name] MRN: [id number] DOB:06/02/1940, 83 y.o., female Today's Date: 05/08/2023   END OF SESSION:  PT End of Session - 05/08/23 1408     Visit Number 7    Date for PT Re-Evaluation 05/29/23    Authorization Type [employer] Medicare    Authorization Time Period 05/08/23 - 06/05/23    Authorization - Visit Number 1    Authorization - Number of Visits 8    PT Start Time 1401    PT Stop Time 1449    PT Time Calculation (min) 48 min    Activity Tolerance Patient tolerated treatment well    Behavior During Therapy [facility] for tasks assessed/performed                 Past Medical History:  Diagnosis Date   Abdominal pain in female 03/18/2010   Qualifier: Diagnosis of  By: Leone Payor MD, Alfonse Ras E    Anemia 06/08/2014   Anxiety    Arthritis    Spinal Osteoarthritis   Breast cancer (HCC)    Carcinoid tumor of stomach    Cataract    Chest pain    Myoview 12/15 no ischemia.   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 09/05/2006   Qualifier: Diagnosis of  By: Charlsie Quest RMA, Lucy     Diabetic peripheral neuropathy (HCC) 10/29/2013   Encounter for preventative adult health care exam with abnormal findings 09/14/2013   Esophageal reflux    Gastric polyp    Fundic Gland   Gastroparesis    Headache(784.0)    Heart murmur    Echocardiogram 2/11: EF 60-65%, mild LAE, grade 1 diastolic dysfunction, aortic valve sclerosis, mean gradient 9 mm of mercury, PASP 34   Hematuria 03/16/2016   Iron deficiency anemia, unspecified    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   Leg swelling    bilateral   Neck pain 04/22/2015   PONV (postoperative nausea and vomiting)    pt states body temperature drops every time she has anesthesia; pt states only needs small amount of anesthesia   PSVT (paroxysmal supraventricular tachycardia) (HCC)    Pure  hypercholesterolemia    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   Renal insufficiency 06/18/2019   pt states  L kidney function very low-functions at about 20%   Stroke [facility])    tia, 2014   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   Type II or unspecified type diabetes mellitus without mention of complication, not stated as uncontrolled    Unspecified essential hypertension    Unspecified hereditary and idiopathic peripheral neuropathy 10/29/2013   Vitamin D deficiency    Past Surgical History:  Procedure Laterality Date   BREAST BIOPSY Right 06/13/2022   [place] RT BREAST BX W LOC DEV 1ST LESION IMG BX SPEC US GUIDE 06/13/2022 [facility]   BREAST LUMPECTOMY Right 07/31/2022   Procedure: RIGHT BREAST LUMPECTOMY;  Surgeon: Abigail Miyamoto, MD;  Location: [facility];  Service: General;  Laterality: Right;   CHOLECYSTECTOMY  1993   COLONOSCOPY  11/11/2010   diverticulosis   DILATATION & CURRETTAGE/HYSTEROSCOPY WITH RESECTOCOPE N/A 02/25/2013   Procedure: Attempted hysteroscopy with uterine perforation;  Surgeon: Jacqualin Combes de Gwenevere Ghazi, MD;  Location: [facility];  Service: Gynecology;  Laterality: N/A;   ESOPHAGOGASTRODUODENOSCOPY  08/29/2010; 09/15/2010   Carcinoid tumor less than 1 cm in July 2012 not seen in August 2012 ,  gastritis, fundic gland polyps   ESOPHAGOGASTRODUODENOSCOPY  05/16/2011   ESOPHAGOGASTRODUODENOSCOPY  06/14/2012   EUS  12/15/2010   Procedure: UPPER ENDOSCOPIC ULTRASOUND (EUS) LINEAR;  Surgeon: Rob Bunting, MD;  Location: [facility];  Service: Endoscopy;  Laterality: N/A;   EYE SURGERY Bilateral    Bi lateral cateracts and bi lateral laser   LAPAROSCOPY N/A 02/25/2013   Procedure: Cystoscopy and laparoscopy with fulguration of uterine serosa;  Surgeon: Jacqualin Combes de Gwenevere Ghazi, MD;  Location: [facility];  Service: Gynecology;  Laterality: N/A;   TONSILLECTOMY     Patient Active Problem List   Diagnosis Date Noted   Precordial chest pain 12/24/2022   Carcinoma of  breast upper outer quadrant, right (HCC) 07/04/2022   SVT (supraventricular tachycardia) (HCC) 06/21/2022   Left-sided weakness 04/02/2022   Unsteady gait 04/02/2022   Oral lesion 03/08/2021   Sun-damaged skin 03/08/2021   Thiamine deficiency 11/01/2020   Trigeminal neuralgia 08/21/2020   Pedal edema 08/21/2020   Chronic left-sided back pain 03/28/2020   Nocturia 03/28/2020   Left-sided headache 03/28/2020   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   Renal insufficiency 06/18/2019   Educated about COVID-19 virus infection 05/15/2019   Atrophic vaginitis 01/20/2019   Stenosis of carotid artery 11/12/2018   Anxiety 10/13/2018   Referred otalgia of left ear 04/23/2018   Chronic throat clearing 04/23/2018   Sinusitis 10/11/2017   Headache 07/12/2017   Dizzy spells 11/21/2016   Constipation 11/21/2016   Nonrheumatic aortic valve stenosis 05/23/2016   Aortic atherosclerosis (HCC) 05/23/2016   Hematuria 03/16/2016   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   Pain of upper abdomen 06/06/2015   Neck pain 04/22/2015   Ear pain 02/28/2015   Abnormal urine 11/21/2014   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   Anemia 06/08/2014   RLS (restless legs syndrome) 11/23/2013   Diabetic peripheral neuropathy (HCC) 10/29/2013   Left-sided thoracic back pain 10/06/2013   Encounter for preventative adult health care exam with abnormal findings 09/14/2013   Iron deficiency anemia    Status post laparoscopy 02/25/2013   Hyponatremia 01/09/2013   GERD (gastroesophageal reflux disease) 01/09/2013   Amaurosis fugax of left eye 10/16/2012   Low back pain 06/03/2012   Vitamin D deficiency 03/11/2012   Bilateral hand pain 10/20/2011   Encounter for long-term (current) use of other medications 10/20/2011   IBS (irritable bowel syndrome) 08/14/2011   TIA (transient ischemic attack) 02/10/2011   Abnormal brain CT 01/19/2011   Allergic rhinitis 10/01/2010   Carcinoid tumor of stomach- history of  09/29/2010   Preventative health care 07/15/2010   FUNDIC GLAND POLYPS OF STOMACH 03/18/2010   Abdominal pain in female 03/18/2010   Left hip pain 03/17/2009   SYSTOLIC MURMUR 03/02/2009   Paroxysmal supraventricular tachycardia (HCC) 01/12/2009   PLANTAR FASCIITIS 06/08/2008   CHEST PAIN 05/18/2008   Gastroparesis 12/18/2007   HYPERCHOLESTEROLEMIA 06/11/2007   Type 2 diabetes mellitus with obesity (HCC) 09/05/2006   Essential hypertension 09/05/2006    PCP: [name], MD   REFERRING PROVIDER: [name], MD   REFERRING DIAG:  M79.10 (ICD-10-CM) - Myalgia  R26.81 (ICD-10-CM) - Unsteady gait  R53.1 (ICD-10-CM) - Weakness   THERAPY DIAG:  Muscle weakness (generalized)  Other abnormalities of gait and mobility  Unsteadiness on feet  Pain in left hip  RATIONALE FOR EVALUATION AND TREATMENT: Rehabilitation  ONSET DATE: Worsening over the past year, especially since breast cancer diagnosis and May 2024  NEXT MD VISIT: 04/23/2023  SUBJECTIVE:                                                                                                                                                                                                         SUBJECTIVE STATEMENT: Sayla reports no pain but fatigue. She states that she has trouble with cleaning, particularly with vacuuming and sweeping and reaching overhead.  EVAL: Pt reports her weakness and pains seems to be exacerbated by recent meds she has had to take. She notes difficulty climbing stairs and does not feel safe using a step stool to reach high things.  She feels like she has no energy.  She reports chronic belly pain, maybe from meds vs diverticulitis.  L hip pain has been an issue for several months but patient has been told there is no "bony" reason for her pain (x-rays negative).  PAIN: Are you having pain? Yes: NPRS scale: 0/10  Pain location: L lateral hip Pain description: dull, sharp when more intense   Aggravating factors: lying on side to sleep (worse when lying on L than R), first moving in the morning Relieving factors: walking, Tylenol   Are you having pain? Yes: NPRS scale: 0/10 Pain location: B upper shoulders Pain description: pinching Aggravating factors: uncertain Relieving factors: keep moving  PERTINENT HISTORY:  Extensive PMH including chronic back and neck pain, OA, osteoporosis, DM, HTN, h/o TIA, diabetic peripheral neuropathy, PVD, headaches, sleep dysfunction, anxiety, R breast cancer s/p lumpectomy 07/31/22, SVT (refer to above problem list and past medical/surgical history for full PMH)   PRECAUTIONS: Fall  RED FLAGS: None  WEIGHT BEARING RESTRICTIONS: No  FALLS:  Has patient fallen in last 6 months? No - admits to a few close calls due to dizziness/lightheadness  LIVING ENVIRONMENT: Lives with: lives alone  Lives in: House/apartment Stairs: No Has following equipment at home: Single point cane, Environmental consultant - 4 wheeled, shower chair, and Grab bars  OCCUPATION: Retired  PLOF: Independent, Needs assistance with homemaking, Leisure: read and play card games on her phone, traveling, and housekeeping/cleaning service ~1x/month    PATIENT GOALS: "To be able to walk steady and feel comfortable going up stairs so I can visit my niece and nephew. To be able to carry groceries."   OBJECTIVE: (objective measures completed at initial evaluation unless otherwise dated)  DIAGNOSTIC FINDINGS:  01/22/23 & 10/05/22 - DH Left hip (2 several months of L hip pain) IMPRESSION: Negative.  COGNITION: Overall cognitive status: Within functional limits for tasks assessed   SENSATION: Numbness and tingling in hands and feet from DM  and cervical radiculopathy  POSTURE:  rounded shoulders, forward head, increased thoracic kyphosis, and flexed trunk   PALPATION: Increased TTP over L TFL, hip flexors and abductors  UPPER EXTREMITY MMT:  MMT Right eval Left eval  Shoulder  flexion 4- 4-  Shoulder extension 3+ 3+  Shoulder abduction 4- 4-  Shoulder adduction    Shoulder internal rotation 4- 4-  Shoulder external rotation 3+ 3+  Middle trapezius    Lower trapezius     (Blank rows = not tested)  MUSCLE LENGTH: Hamstrings: mod tight B ITB: mod tight B  Piriformis: severe tight B Hip flexors: mild/mod tight L>R  Quads: mild/mod tight L>R  Heelcord: mod tight B  LOWER EXTREMITY ROM:    Mildly limited primarily due to muscle tightness  LOWER EXTREMITY MMT:  (tested in sitting on eval)  MMT Right eval Left eval  Hip flexion 3+ 3+  Hip extension 3- 3-  Hip abduction 3+ 3+  Hip adduction 3+ 3+  Hip internal rotation 3- 3-  Hip external rotation 3- 3-  Knee flexion 4- 4-  Knee extension 4- 4  Ankle dorsiflexion 3+ 4-  Ankle plantarflexion    Ankle inversion    Ankle eversion    (Blank rows = not tested)  BED MOBILITY:  Sit to supine Min A Supine to sit Min A Rolling to Right SBA  TRANSFERS: Assistive device utilized: None  Sit to stand: Complete Independence Stand to sit: Complete Independence Chair to chair: Modified independence Floor:  NT  GAIT: Distance walked: Clinic distances Assistive device utilized: None Level of assistance: Complete Independence and SBA Gait pattern: decreased stride length and trunk flexed Comments: Decreased gait speed  RAMP: Level of Assistance:  NT Assistive device utilized:    Ramp Comments:   CURB:  Level of Assistance:  NT Assistive device utilized:    Curb Comments:   STAIRS: Level of Assistance: SBA Stair Negotiation Technique: Step to Pattern, Sideways (descent), Forwards (ascent) with Single Rail on Right Number of Stairs: 14  Height of Stairs: 7"  Comments: forward for ascent, sideways leading with R foot for descent    FUNCTIONAL TESTS: (TBA next visit) 5 times sit to stand: 30.43 sec; >15 sec indicates a risk for recurrent falls  Timed up and go (TUG): 16.03 sec; >13.5 sec  indicates a high risk for falls  10 meter walk test: 13.91 sec ; Gait speed = 2.36 ft/sec; >/=2.62 ft/sec is necessary to be considered a Materials engineer Scale: 44/56; Scores of 37-45 indicate significant risk for falls (>80%)  Dynamic Gait Index: 15/24; Scores of 19 or less are predictive of falls in older community living adults.    05/03/23   TUG: 13.5 sec  PATIENT SURVEYS:  ABC scale 580 / 1600 = 36.3 % , scores of <50% indicating a low level of physical functioning and scores of <67% indicating a risk for falls in patients with vestibular disorders   TODAY'S TREATMENT:   05/08/23 THERAPEUTIC EXERCISE: To improve strength and endurance.  Demonstration, verbal and tactile cues throughout for technique. UBE 1.0 x (3 min fwd, 3 min backward) Seated knee extension + 1lb ankle weights x10 B Standing hip ABD + 1lb ankle weight w/ support x10 B Standing HS curls + 1lb ankle weight w/ support x10 B  NEUROMUSCULAR RE-EDUCATION: To improve kinesthesia and posture. Wall Slides + scapular retraction x6 - Stopped due to pain, notices shoulder shrug; after scapular exercises, tried this again w/  reduced shoulder shrug x5 Standing YTB + scapular retraction row 2 x 10 Standing YTB scap retraction + B shoulder extension 2 x 10 Reactive isometric B shoulder flexion step out YTB - x10  SELF CARE: Provided education  options to progress HEP by adding ankle weight . Simulated mopping & vacuuming w/ swiffer - Cues to avoid bending and reaching with mopping/vacuuming, pt needs to tuck elbows against body and step fwd w/ device  05/03/23 THERAPEUTIC EXERCISE: To improve strength and endurance.  Demonstration, verbal and tactile cues throughout for technique.  NuStep - L4 x 6 min (UE/LE) Seated LAQ - limited by posterior LE pain, re-attempted after HS stretches with a little better tolerance x 5 bil Seated hip hinge HS stretch 5 x ~5"  Seated RTB B hip ABD/ER clam x 10 Seated RTB  alt hip flexion march x 10 Bridge 2 x 10, 2nd set with looped YTB hip ABD isometric  Standing alt hip extension x 10, UE support on back of chair for balance Seated Fitter leg press (1 black) x 10 bil  THERAPEUTIC ACTIVITIES: To improve functional performance.  Demonstration, verbal and tactile cues throughout for technique. TUG: 13.5 sec   05/01/2023  THERAPEUTIC EXERCISE: To improve strength and endurance.  Demonstration, verbal and tactile cues throughout for technique.  NuStep - L4 x 6 min (UE/LE) Seated RTB B hip ABD/ER clam 2 x 10 Seated RTB alt hip flexion march 2 x 10 Sit to stand + RTB hip ABD isometric (looped band at knees) 1 x 5, 1 x 6 Seated RTB HS curls x 10 bil  Seated Fitter leg press (1 black) x 10 bil  NEUROMUSCULAR RE-EDUCATION: To improve kinesthesia and posture. Seated YTB scap retraction + B shoulder row x 10 Seated YTB scap retraction + B shoulder extension x 10 Seated YTB pallof press x 10 bil   PATIENT EDUCATION:  Education details: continue with current HEP and [employer]  Person educated: Patient Education method: Explanation Education comprehension: verbalized understanding  HOME EXERCISE PROGRAM: Access Code: [email or web address] URL: [email or web address] Date: 04/24/2023 Prepared by: Glenetta Hew  Patient Education - Check for Safety - [place] Program   ASSESSMENT:  CLINICAL IMPRESSION: [name] reports no pain but is feeling tired. She states that she is doing her [place] fall prevention program HEP twice a week. Mentions that she has trouble with cleaning specifically sweeping and vacuuming as well as overhead reaching. Used Swiffer to mimic sweeping and mopping; PT notices she reaches and bends with movement. To avoid bending and reaching with the vacuuming and mopping, she is cued to tuck elbows against body and step forward with device to correct form and reduce pain. Incorporated scapular exercises to strengthen muscles  and help with reaching overhead. She tolerated exercises fairly well and noticed pain with wall slides initially. Initiated wall slides again after scapular activation exercises that she handled better but was limited due to fatigue after a few reps. Guidance was given to add or progress resistance w/ [place]. Recommended adjustable ankle weights that could be added to [facility] if desired. Breshay will benefit from continued skilled PT to improve mobility and ongoing strength deficits to reach functional mobility and independence.  OBJECTIVE IMPAIRMENTS: Abnormal gait, decreased activity tolerance, decreased balance, decreased coordination, decreased endurance, decreased knowledge of condition, decreased knowledge of use of DME, decreased mobility, difficulty walking, decreased ROM, decreased strength, decreased safety awareness, dizziness, increased fascial restrictions, impaired perceived functional ability, increased muscle spasms, impaired flexibility, impaired sensation, impaired  UE functional use, improper body mechanics, postural dysfunction, and pain.   ACTIVITY LIMITATIONS: carrying, lifting, bending, standing, squatting, sleeping, stairs, transfers, bed mobility, and locomotion level  PARTICIPATION LIMITATIONS: meal prep, cleaning, laundry, driving, shopping, community activity, and church  PERSONAL FACTORS: Age, Fitness, Past/current experiences, Social background, Time since onset of injury/illness/exacerbation, and 3+ comorbidities: Extensive PMH including chronic back and neck pain, OA, osteoporosis, DM, HTN, h/o TIA, diabetic peripheral neuropathy, PVD, headaches, sleep dysfunction, anxiety, R breast cancer s/p lumpectomy 07/31/22, SVT (refer to above problem list and past medical/surgical history for full PMH)   are also affecting patient's functional outcome.   REHAB POTENTIAL: Good  CLINICAL DECISION MAKING: Unstable/unpredictable  EVALUATION COMPLEXITY: High   GOALS: Goals  reviewed with patient? Yes  SHORT TERM GOALS: Target date: 05/01/2023  Patient will be independent with initial HEP to improve outcomes and carryover.  Baseline:  Goal status: MET - 05/01/23 - pt denies any questions or concerns with [place] fall prevention program  2.  Patient will be educated on strategies to decrease risk of falls.  Baseline:  Goal status: MET - 04/24/23  3.  Patient will demonstrate decreased TUG time to </= 13.5 sec to decrease risk for falls with transitional mobility. Baseline:  Goal status: MET - 13.5 sec - 05/03/23  LONG TERM GOALS: Target date: 05/29/2023  Patient will be independent with advanced/ongoing HEP to facilitate ability to maintain/progress functional gains from skilled physical therapy services. Baseline:  Goal status: IN PROGRESS  2.  Patient will be able to ambulate 600' with or w/o LRAD on variable surfaces with good safety to access community.  Baseline:  Goal status: IN PROGRESS  3.  Patient will be able to step up/down curb safely with or w/o LRAD for safety with community ambulation.  Baseline:  Goal status: IN PROGRESS   4.  Patient will demonstrate improved B LE strength to >/= 4/5 for improved stability and ease of mobility. Baseline: Refer to above LE MMT table Goal status: IN PROGRESS  5.  Patient will improve 5x STS time to </= 15 seconds for improved efficiency and safety with transfers. Baseline: 30.43 sec (04/09/23) Goal status: IN PROGRESS   6.  Patient will demonstrate gait speed of >/= 2.62 ft/sec (0.8 m/s) to be a safe community ambulator with decreased risk for recurrent falls.  Baseline: 2.36 ft/sec (04/09/23) Goal status: IN PROGRESS  7.  Patient will improve Berg score by at least 8 points to improve safety and stability with ADLs in standing and reduce risk for falls.  Baseline: 44/56 (04/09/23) Goal status: IN PROGRESS  8.  Patient will demonstrate at least 19/24 on DGI or 19/30 on FGA to improve gait stability and  reduce risk for falls. Baseline: 15/24 (04/09/23) Goal status: IN PROGRESS  9. Patient will report >/= 50% on ABC scale to demonstrate improved balance confidence with functional mobility and gait. Baseline: 580 / 1600 = 36.3 % Goal status: IN PROGRESS   PLAN:  PT FREQUENCY: 2x/week  PT DURATION: 8 weeks  PLANNED INTERVENTIONS: 97164- PT Re-evaluation, 97110-Therapeutic exercises, 97530- Therapeutic activity, [id number]- Neuromuscular re-education, 97535- Self Care, 16109- Manual therapy, [phone]- Gait training, [phone]- Canalith repositioning, [id number]- Electrical stimulation (unattended), 97035- Ultrasound, 29562- Ionotophoresis 4mg /ml Dexamethasone, Patient/Family education, Balance training, Stair training, Taping, Dry Needling, Joint mobilization, Spinal mobilization, Vestibular training, Cryotherapy, Moist heat, and 97750- Physical performance test or measurement  PLAN FOR NEXT SESSION: Review [place] fall prevention program PRN; LE strengthening; balance training   Arjuna Doeden Joseph-Greene, Student-PT  05/08/2023, 3:46 PM    Date of referral: 01/16/2023 Referring provider: [name], MD Referring diagnosis?  M79.10 (ICD-10-CM) - Myalgia  R26.81 (ICD-10-CM) - Unsteady gait  R53.1 (ICD-10-CM) - Weakness  Treatment diagnosis? (if different than referring diagnosis)  Muscle weakness (generalized)  Other abnormalities of gait and mobility  Unsteadiness on feet  Pain in left hip  What was this (referring dx) caused by? Ongoing Issue and Other: Sequelae of treatments for breast cancer  Nature of Condition: Chronic (continuous duration > 3 months)   Laterality: Lt  Current Functional Measure Score: Other ABC scale = 580 / 1600 = 36.3 %  Objective measurements identify impairments when they are compared to normal values, the uninvolved extremity, and prior level of function.  [x]  Yes  []  No  Objective assessment of functional ability: Severe functional limitations   Briefly describe  symptoms: Patient presents with physical impairments of L anterior lateral hip pain, impaired activity tolerance, global UE/LE weakness, dizziness, impaired standing balance, impaired ambulation, inability to navigate stairs and decreased safety awareness impacting safe and independent functional mobility.    How did symptoms start: Gradual onset exacerbated by treatment for breast cancer starting May 2024  Average pain intensity:  Last 24 hours: 6/10  Past week: up to 6-10/10  How often does the pt experience symptoms? Constantly  How much have the symptoms interfered with usual daily activities? Extremely  How has condition changed since care began at this facility? A little better  In general, how is the patients overall health? Fair  Onset date: Chronic   BACK PAIN (STarT Back Screening Tool) - (When applicable): N/A  Has your back pain spread down your leg(s) at sometime in the last 2 weeks? []  Yes   []  No Have you had pain in the shoulder or neck at sometime in the past 2 weeks? []  Yes   []  No Have you only walked short distances because of your back pain? []  Yes   []  No In the past 2 weeks, have you dressed more slowly than usual because of your back pain? []  Yes   []  No Do you think it is not really safe for person with a condition like yours to be physically active? []  Yes   []  No Have worrying thoughts been going through your mind a lot of the time? []  Yes   []  No Do you feel that your back pain is terrible and it is never going to get any better? []  Yes   []  No In general, have you stopped enjoying all the things you usually enjoy? []  Yes   []  No Overall, how bothersome has your back pain been in the last 2 weeks? []  Not at all   []  Slightly     []  Moderate   []  Very much     []  Extremely

## 2023-05-10 ENCOUNTER — Ambulatory Visit: Admitting: Physical Therapy

## 2023-05-15 ENCOUNTER — Ambulatory Visit (INDEPENDENT_AMBULATORY_CARE_PROVIDER_SITE_OTHER): Admitting: Physician Assistant

## 2023-05-15 ENCOUNTER — Ambulatory Visit: Payer: Self-pay

## 2023-05-15 ENCOUNTER — Ambulatory Visit: Attending: Family Medicine | Admitting: Physical Therapy

## 2023-05-15 ENCOUNTER — Other Ambulatory Visit (HOSPITAL_BASED_OUTPATIENT_CLINIC_OR_DEPARTMENT_OTHER): Payer: Self-pay

## 2023-05-15 ENCOUNTER — Encounter: Payer: Self-pay | Admitting: Physical Therapy

## 2023-05-15 ENCOUNTER — Encounter: Payer: Self-pay | Admitting: Physician Assistant

## 2023-05-15 VITALS — BP 143/81 | HR 61 | Temp 97.6°F | Ht 61.0 in | Wt 165.2 lb

## 2023-05-15 DIAGNOSIS — R2689 Other abnormalities of gait and mobility: Secondary | ICD-10-CM | POA: Insufficient documentation

## 2023-05-15 DIAGNOSIS — R208 Other disturbances of skin sensation: Secondary | ICD-10-CM | POA: Diagnosis not present

## 2023-05-15 DIAGNOSIS — M6281 Muscle weakness (generalized): Secondary | ICD-10-CM | POA: Diagnosis not present

## 2023-05-15 DIAGNOSIS — M25552 Pain in left hip: Secondary | ICD-10-CM | POA: Diagnosis not present

## 2023-05-15 DIAGNOSIS — H6502 Acute serous otitis media, left ear: Secondary | ICD-10-CM

## 2023-05-15 DIAGNOSIS — R2681 Unsteadiness on feet: Secondary | ICD-10-CM | POA: Diagnosis not present

## 2023-05-15 MED ORDER — NYSTATIN 100000 UNIT/ML MT SUSP
5.0000 mL | Freq: Four times a day (QID) | OROMUCOSAL | 0 refills | Status: DC
Start: 2023-05-15 — End: 2023-06-29
  Filled 2023-05-15: qty 60, 3d supply, fill #0

## 2023-05-15 NOTE — Telephone Encounter (Signed)
 Chief Complaint: Sore throat Symptoms: Sore throat, irritation in ears, see notes Frequency: 2 days Pertinent Negatives: Patient denies fever Disposition: [] ED /[] Urgent Care (no appt availability in office) / [x] Appointment(In office/virtual)/ []  [facility]/ [] Home Care/ [] Refused Recommended Disposition /[] [facility]/ []  Follow-up with PCP Additional Notes: Patient called in stating she is experiencing discomfort in her throat and ears. Patient describes it as a similar feeling to the last time she had thrush. Patient is currently being treated for cancer and was told thrush is a side effect she may experience. Patient also states when she is sleeping on her back, her breathing is uncomfortable. Appt made for earliest available option for [facility] today, per patient request.    Copied from CRM [id number]. Topic: Clinical - Red Word Triage >> May 15, 2023 10:00 AM Danika B wrote: [facility] that prompted transfer to Nurse Triage: Bacterial rash in mouth, throat pain. Cannot swallow. States has been occurring for last 2 days. Reason for Disposition  SEVERE (e.g., excruciating) throat pain  Answer Assessment - Initial Assessment Questions 1. ONSET: "When did the throat start hurting?" (Hours or days ago)      Two days ago 2. SEVERITY: "How bad is the sore throat?" (Scale 1-10; mild, moderate or severe)   - MILD (1-3):  Doesn't interfere with eating or normal activities.   - MODERATE (4-7): Interferes with eating some solids and normal activities.   - SEVERE (8-10):  Excruciating pain, interferes with most normal activities.   - SEVERE WITH DYSPHAGIA (10): Can't swallow liquids, drooling.     Severe and painful swallowing 3. STREP EXPOSURE: "Has there been any exposure to strep within the past week?" If Yes, ask: "What type of contact occurred?"      N/a 4.  VIRAL SYMPTOMS: "Are there any symptoms of a cold, such as a runny nose, cough, hoarse voice or red eyes?"       Painful swallowing 5. FEVER: "Do you have a fever?" If Yes, ask: "What is your temperature, how was it measured, and when did it start?"     No 6. PUS ON THE TONSILS: "Is there pus on the tonsils in the back of your throat?"     One spot  Protocols used: Sore Throat-A-AH

## 2023-05-15 NOTE — Progress Notes (Signed)
 Established patient visit   Patient: [name]   DOB: 1940/09/29   83 y.o. Female  MRN: [id number] Visit Date: 05/15/2023  Today's healthcare provider: Alfredia Ferguson, PA-C   Cc. Sore throat, trouble swallowing  Subjective     Pt reports sore throat, L ear pain, trouble swallowing x 2-3 days. She has a history of thrush and feels it has recurred.   Medications: Outpatient Medications Prior to Visit  Medication Sig   acetaminophen (TYLENOL) 325 MG tablet Take 2 tablets (650 mg total) by mouth every 6 (six) hours as needed for up to 30 doses for mild pain or moderate pain.   amLODipine (NORVASC) 5 MG tablet TAKE 1 TABLET BY MOUTH DAILY   anastrozole (ARIMIDEX) 1 MG tablet Take 1 tablet (1 mg total) by mouth daily.   aspirin EC 81 MG tablet Take 81 mg by mouth daily. Swallow whole.   betamethasone valerate ointment (VALISONE) 0.1 % Apply a small amount to affected area topically every other day.   Blood Glucose Monitoring Suppl (ONE TOUCH ULTRA 2) w/Device KIT USE TO CHECK BLOOD SUGAR AS DIRECTED   calcium-vitamin D (OSCAL WITH D) 500-5 MG-MCG tablet Take 1 tablet by mouth daily.   cholecalciferol (VITAMIN D) 1000 UNITS tablet Take 1,000 Units by mouth daily.   dicyclomine (BENTYL) 10 MG capsule Take 1 capsule (10 mg total) by mouth as needed for spasms.   famotidine (PEPCID) 40 MG tablet Take 1 tablet (40 mg total) by mouth daily.   furosemide (LASIX) 20 MG tablet TAKE 1 TABLET BY MOUTH DAILY AS  NEEDED   hyoscyamine (LEVSIN SL) 0.125 MG SL tablet Place 1 tablet (0.125 mg total) under the tongue every 4 (four) hours as needed.   Lancets (ONETOUCH ULTRASOFT) lancets USE AS DIRECTED 3 TIMES  DAILY   losartan (COZAAR) 50 MG tablet TAKE 1 TABLET BY MOUTH TWICE  DAILY   metFORMIN (GLUCOPHAGE-XR) 500 MG 24 hr tablet Take 1 tablet (500 mg total) by mouth daily.   metoCLOPramide (REGLAN) 5 MG tablet TAKE 1 TABLET BY MOUTH TWICE  DAILY AS NEEDED   metoprolol tartrate  (LOPRESSOR) 50 MG tablet TAKE 1 TABLET BY MOUTH TWICE  DAILY   Multiple Vitamin (MULTIVITAMIN ADULT PO) Take by mouth.   ondansetron (ZOFRAN) 4 MG tablet Take 1 tablet (4 mg total) by mouth every 6 (six) hours as needed.   ONETOUCH ULTRA test strip CHECK BLOOD SUGAR 3 TIMES DAILY  OR AS NEEDED   pantoprazole (PROTONIX) 40 MG tablet Take 1 tablet (40 mg total) by mouth daily.   potassium chloride (KLOR-CON M) 10 MEQ tablet TAKE 1 TABLET BY MOUTH DAILY   rosuvastatin (CRESTOR) 20 MG tablet Take 1 tablet (20 mg total) by mouth daily.   thiamine (VITAMIN B-1) 100 MG tablet Take 100 mg by mouth every other day.   vitamin E 180 MG (400 UNITS) capsule Take 400 Units by mouth daily.   No facility-administered medications prior to visit.    Review of Systems  Constitutional:  Negative for fatigue and fever.  HENT:  Positive for sore throat and trouble swallowing.   Respiratory:  Negative for cough and shortness of breath.   Cardiovascular:  Negative for chest pain and leg swelling.  Gastrointestinal:  Negative for abdominal pain.  Neurological:  Negative for dizziness and headaches.       Objective    BP (!) 143/81   Pulse 61   Temp 97.6 F (36.4 C)  Ht 5\' 1"  (1.549 m)   Wt 165 lb 3.2 oz (74.9 kg)   LMP 02/07/1992   BMI 31.21 kg/m    Physical Exam Vitals reviewed.  Constitutional:      Appearance: She is not ill-appearing.  HENT:     Head: Normocephalic.     Ears:     Comments: B/l serous effusion with bulging TM no erythema or injection    Mouth/Throat:     Pharynx: Posterior oropharyngeal erythema present. No oropharyngeal exudate.  Eyes:     Conjunctiva/sclera: Conjunctivae normal.  Cardiovascular:     Rate and Rhythm: Normal rate.  Pulmonary:     Effort: Pulmonary effort is normal. No respiratory distress.  Neurological:     Mental Status: She is alert and oriented to person, place, and time.  Psychiatric:        Mood and Affect: Mood normal.        Behavior:  Behavior normal.     No results found for any visits on 05/15/23.  Assessment & Plan    Burning sensation of mouth Recommending nystatin mouthwashes, Hydration, biotene dry mouth wash -     Nystatin; Take 5 mLs (500,000 Units total) by mouth 4 (four) times daily. Gargle/swish in mouth for 5 minutes and then spit  Dispense: 60 mL; Refill: 0  Non-recurrent acute serous otitis media of left ear -rec flonase and claritin otc   Return if symptoms worsen or fail to improve.       Alfredia Ferguson, PA-C  [name] [phone] (phone) [phone] (fax)  [facility]

## 2023-05-15 NOTE — Therapy (Signed)
 OUTPATIENT PHYSICAL THERAPY TREATMENT   Patient Name: [name] MRN: [id number] DOB:[birth year], 83 y.o., female Today's Date: 05/15/2023   END OF SESSION:  PT End of Session - 05/15/23 1405     Visit Number 8    Date for PT Re-Evaluation 05/29/23    Authorization Type [employer] Medicare    Authorization Time Period 05/08/23 - 06/05/23    Authorization - Visit Number 2    Authorization - Number of Visits 8    Progress Note Due on Visit 10    PT Start Time 1402    PT Stop Time 1445    PT Time Calculation (min) 43 min    Activity Tolerance Patient tolerated treatment well    Behavior During Therapy [facility] for tasks assessed/performed                 Past Medical History:  Diagnosis Date   Abdominal pain in female 03/18/2010   Qualifier: Diagnosis of  By: Leone Payor MD, Alfonse Ras E    Anemia 06/08/2014   Anxiety    Arthritis    Spinal Osteoarthritis   Breast cancer (HCC)    Carcinoid tumor of stomach    Cataract    Chest pain    Myoview 12/15 no ischemia.   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 09/05/2006   Qualifier: Diagnosis of  By: Charlsie Quest RMA, Lucy     Diabetic peripheral neuropathy (HCC) 10/29/2013   Encounter for preventative adult health care exam with abnormal findings 09/14/2013   Esophageal reflux    Gastric polyp    Fundic Gland   Gastroparesis    Headache(784.0)    Heart murmur    Echocardiogram 2/11: EF 60-65%, mild LAE, grade 1 diastolic dysfunction, aortic valve sclerosis, mean gradient 9 mm of mercury, PASP 34   Hematuria 03/16/2016   Iron deficiency anemia, unspecified    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   Leg swelling    bilateral   Neck pain 04/22/2015   PONV (postoperative nausea and vomiting)    pt states body temperature drops every time she has anesthesia; pt states only needs small amount of anesthesia   PSVT (paroxysmal supraventricular tachycardia)  (HCC)    Pure hypercholesterolemia    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   Renal insufficiency 06/18/2019   pt states  L kidney function very low-functions at about 20%   Stroke [facility])    tia, 2014   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   Type II or unspecified type diabetes mellitus without mention of complication, not stated as uncontrolled    Unspecified essential hypertension    Unspecified hereditary and idiopathic peripheral neuropathy 10/29/2013   Vitamin D deficiency    Past Surgical History:  Procedure Laterality Date   BREAST BIOPSY Right 06/13/2022   [place] RT BREAST BX W LOC DEV 1ST LESION IMG BX SPEC US GUIDE 06/13/2022 [facility]   BREAST LUMPECTOMY Right 07/31/2022   Procedure: RIGHT BREAST LUMPECTOMY;  Surgeon: Abigail Miyamoto, MD;  Location: [facility];  Service: General;  Laterality: Right;   CHOLECYSTECTOMY  1993   COLONOSCOPY  11/11/2010   diverticulosis   DILATATION & CURRETTAGE/HYSTEROSCOPY WITH RESECTOCOPE N/A 02/25/2013   Procedure: Attempted hysteroscopy with uterine perforation;  Surgeon: Jacqualin Combes de Gwenevere Ghazi, MD;  Location: [facility];  Service: Gynecology;  Laterality: N/A;   ESOPHAGOGASTRODUODENOSCOPY  08/29/2010; 09/15/2010   Carcinoid tumor less than 1  cm in July 2012 not seen in August 2012 , gastritis, fundic gland polyps   ESOPHAGOGASTRODUODENOSCOPY  05/16/2011   ESOPHAGOGASTRODUODENOSCOPY  06/14/2012   EUS  12/15/2010   Procedure: UPPER ENDOSCOPIC ULTRASOUND (EUS) LINEAR;  Surgeon: Rob Bunting, MD;  Location: [facility];  Service: Endoscopy;  Laterality: N/A;   EYE SURGERY Bilateral    Bi lateral cateracts and bi lateral laser   LAPAROSCOPY N/A 02/25/2013   Procedure: Cystoscopy and laparoscopy with fulguration of uterine serosa;  Surgeon: Jacqualin Combes de Gwenevere Ghazi, MD;  Location: [facility];  Service: Gynecology;  Laterality: N/A;   TONSILLECTOMY     Patient Active Problem List   Diagnosis Date Noted   Precordial chest pain 12/24/2022    Carcinoma of breast upper outer quadrant, right (HCC) 07/04/2022   SVT (supraventricular tachycardia) (HCC) 06/21/2022   Left-sided weakness 04/02/2022   Unsteady gait 04/02/2022   Oral lesion 03/08/2021   Sun-damaged skin 03/08/2021   Thiamine deficiency 11/01/2020   Trigeminal neuralgia 08/21/2020   Pedal edema 08/21/2020   Chronic left-sided back pain 03/28/2020   Nocturia 03/28/2020   Left-sided headache 03/28/2020   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   Renal insufficiency 06/18/2019   Educated about COVID-19 virus infection 05/15/2019   Atrophic vaginitis 01/20/2019   Stenosis of carotid artery 11/12/2018   Anxiety 10/13/2018   Referred otalgia of left ear 04/23/2018   Chronic throat clearing 04/23/2018   Sinusitis 10/11/2017   Headache 07/12/2017   Dizzy spells 11/21/2016   Constipation 11/21/2016   Nonrheumatic aortic valve stenosis 05/23/2016   Aortic atherosclerosis (HCC) 05/23/2016   Hematuria 03/16/2016   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   Pain of upper abdomen 06/06/2015   Neck pain 04/22/2015   Ear pain 02/28/2015   Abnormal urine 11/21/2014   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   Anemia 06/08/2014   RLS (restless legs syndrome) 11/23/2013   Diabetic peripheral neuropathy (HCC) 10/29/2013   Left-sided thoracic back pain 10/06/2013   Encounter for preventative adult health care exam with abnormal findings 09/14/2013   Iron deficiency anemia    Status post laparoscopy 02/25/2013   Hyponatremia 01/09/2013   GERD (gastroesophageal reflux disease) 01/09/2013   Amaurosis fugax of left eye 10/16/2012   Low back pain 06/03/2012   Vitamin D deficiency 03/11/2012   Bilateral hand pain 10/20/2011   Encounter for long-term (current) use of other medications 10/20/2011   IBS (irritable bowel syndrome) 08/14/2011   TIA (transient ischemic attack) 02/10/2011   Abnormal brain CT 01/19/2011   Allergic rhinitis 10/01/2010   Carcinoid tumor of stomach- history  of 09/29/2010   Preventative health care 07/15/2010   FUNDIC GLAND POLYPS OF STOMACH 03/18/2010   Abdominal pain in female 03/18/2010   Left hip pain 03/17/2009   SYSTOLIC MURMUR 03/02/2009   Paroxysmal supraventricular tachycardia (HCC) 01/12/2009   PLANTAR FASCIITIS 06/08/2008   CHEST PAIN 05/18/2008   Gastroparesis 12/18/2007   HYPERCHOLESTEROLEMIA 06/11/2007   Type 2 diabetes mellitus with obesity (HCC) 09/05/2006   Essential hypertension 09/05/2006    PCP: [name], MD   REFERRING PROVIDER: [name], MD   REFERRING DIAG:  M79.10 (ICD-10-CM) - Myalgia  R26.81 (ICD-10-CM) - Unsteady gait  R53.1 (ICD-10-CM) - Weakness   THERAPY DIAG:  Muscle weakness (generalized)  Other abnormalities of gait and mobility  Unsteadiness on feet  Pain in left hip  RATIONALE FOR EVALUATION AND TREATMENT: Rehabilitation  ONSET DATE: Worsening over the past year, especially since breast cancer diagnosis  and May 2024  NEXT MD VISIT: 04/23/2023   SUBJECTIVE:                                                                                                                                                                                                         SUBJECTIVE STATEMENT: Lindy reports she was jsut at the MD and was diagnosed with thrush.  She notes overall soreness/achiness today with L side more involved esp in shoulder but did not feel like she could rate the pain.  EVAL: Pt reports her weakness and pains seems to be exacerbated by recent meds she has had to take. She notes difficulty climbing stairs and does not feel safe using a step stool to reach high things.  She feels like she has no energy.  She reports chronic belly pain, maybe from meds vs diverticulitis.  L hip pain has been an issue for several months but patient has been told there is no "bony" reason for her pain (x-rays negative).  PAIN: Are you having pain? Yes: NPRS scale: 0/10  Pain location: L  lateral hip Pain description: dull, sharp when more intense  Aggravating factors: lying on side to sleep (worse when lying on L than R), first moving in the morning Relieving factors: walking, Tylenol   Are you having pain? Yes: NPRS scale: 0/10 Pain location: B upper shoulders Pain description: pinching Aggravating factors: uncertain Relieving factors: keep moving  PERTINENT HISTORY:  Extensive PMH including chronic back and neck pain, OA, osteoporosis, DM, HTN, h/o TIA, diabetic peripheral neuropathy, PVD, headaches, sleep dysfunction, anxiety, R breast cancer s/p lumpectomy 07/31/22, SVT (refer to above problem list and past medical/surgical history for full PMH)   PRECAUTIONS: Fall  RED FLAGS: None  WEIGHT BEARING RESTRICTIONS: No  FALLS:  Has patient fallen in last 6 months? No - admits to a few close calls due to dizziness/lightheadness  LIVING ENVIRONMENT: Lives with: lives alone  Lives in: House/apartment Stairs: No Has following equipment at home: Single point cane, Environmental consultant - 4 wheeled, shower chair, and Grab bars  OCCUPATION: Retired  PLOF: Independent, Needs assistance with homemaking, Leisure: read and play card games on her phone, traveling, and housekeeping/cleaning service ~1x/month    PATIENT GOALS: "To be able to walk steady and feel comfortable going up stairs so I can visit my niece and nephew. To be able to carry groceries."   OBJECTIVE: (objective measures completed at initial evaluation unless otherwise dated)  DIAGNOSTIC FINDINGS:  01/22/23 & 10/05/22 - DH Left hip (2 several months of L hip pain)  IMPRESSION: Negative.  COGNITION: Overall cognitive status: Within functional limits for tasks assessed   SENSATION: Numbness and tingling in hands and feet from DM and cervical radiculopathy  POSTURE:  rounded shoulders, forward head, increased thoracic kyphosis, and flexed trunk   PALPATION: Increased TTP over L TFL, hip flexors and  abductors  UPPER EXTREMITY MMT:  MMT Right eval Left eval  Shoulder flexion 4- 4-  Shoulder extension 3+ 3+  Shoulder abduction 4- 4-  Shoulder adduction    Shoulder internal rotation 4- 4-  Shoulder external rotation 3+ 3+  Middle trapezius    Lower trapezius     (Blank rows = not tested)  MUSCLE LENGTH: Hamstrings: mod tight B ITB: mod tight B  Piriformis: severe tight B Hip flexors: mild/mod tight L>R  Quads: mild/mod tight L>R  Heelcord: mod tight B  LOWER EXTREMITY ROM:    Mildly limited primarily due to muscle tightness  LOWER EXTREMITY MMT:  (tested in sitting on eval)  MMT Right eval Left eval  Hip flexion 3+ 3+  Hip extension 3- 3-  Hip abduction 3+ 3+  Hip adduction 3+ 3+  Hip internal rotation 3- 3-  Hip external rotation 3- 3-  Knee flexion 4- 4-  Knee extension 4- 4  Ankle dorsiflexion 3+ 4-  Ankle plantarflexion    Ankle inversion    Ankle eversion    (Blank rows = not tested)  BED MOBILITY:  Sit to supine Min A Supine to sit Min A Rolling to Right SBA  TRANSFERS: Assistive device utilized: None  Sit to stand: Complete Independence Stand to sit: Complete Independence Chair to chair: Modified independence Floor:  NT  GAIT: Distance walked: Clinic distances Assistive device utilized: None Level of assistance: Complete Independence and SBA Gait pattern: decreased stride length and trunk flexed Comments: Decreased gait speed  RAMP: Level of Assistance:  NT Assistive device utilized:    Ramp Comments:   CURB:  Level of Assistance:  NT Assistive device utilized:    Curb Comments:   STAIRS: Level of Assistance: SBA Stair Negotiation Technique: Step to Pattern, Sideways (descent), Forwards (ascent) with Single Rail on Right Number of Stairs: 14  Height of Stairs: 7"  Comments: forward for ascent, sideways leading with R foot for descent    FUNCTIONAL TESTS: (TBA next visit) 5 times sit to stand: 30.43 sec; >15 sec indicates a risk  for recurrent falls  Timed up and go (TUG): 16.03 sec; >13.5 sec indicates a high risk for falls  10 meter walk test: 13.91 sec ; Gait speed = 2.36 ft/sec; >/=2.62 ft/sec is necessary to be considered a Materials engineer Scale: 44/56; Scores of 37-45 indicate significant risk for falls (>80%)  Dynamic Gait Index: 15/24; Scores of 19 or less are predictive of falls in older community living adults.    05/03/23   TUG: 13.5 sec  05/15/23 = 11.78 sec Gait speed = 2.78 ft/sec  PATIENT SURVEYS:  ABC scale 580 / 1600 = 36.3 % , scores of <50% indicating a low level of physical functioning and scores of <67% indicating a risk for falls in patients with vestibular disorders   TODAY'S TREATMENT:   05/15/23 THERAPEUTIC EXERCISE: To improve strength and endurance.  Demonstration, verbal and tactile cues throughout for technique.  NuStep - L4 x 6 min (UE/LE) BATCA seated row 5# 2 x 10 BATCA seated knee flexion 15# x 10  NEUROMUSCULAR RE-EDUCATION: To improve balance, coordination, kinesthesia, proprioception, and reduce fall  risk. Alt toe clears to 9" stool x 10 Fwd & back alt single step-over 1/2 foam roll x 10 Lateral double LE step-over 1/2 foam roll x 10  THERAPEUTIC ACTIVITIES: To improve functional performance.  Demonstration, verbal and tactile cues throughout for technique. Fwd step-up to 6" step x 5 bil, single UE support on back of chair to simulate handrail = 11.78 sec Gait speed = 2.78 ft/sec   05/08/23 THERAPEUTIC EXERCISE: To improve strength and endurance.  Demonstration, verbal and tactile cues throughout for technique. UBE 1.0 x (3 min fwd, 3 min backward) Seated knee extension + 1lb ankle weights x10 B Standing hip ABD + 1lb ankle weight w/ support x10 B Standing HS curls + 1lb ankle weight w/ support x10 B  NEUROMUSCULAR RE-EDUCATION: To improve kinesthesia and posture. Wall Slides + scapular retraction x6 - Stopped due to pain, notices  shoulder shrug; after scapular exercises, tried this again w/ reduced shoulder shrug x5 Standing YTB + scapular retraction row 2 x 10 Standing YTB scap retraction + B shoulder extension 2 x 10 Reactive isometric B shoulder flexion step out YTB - x10  SELF CARE: Provided education  options to progress HEP by adding ankle weight . Simulated mopping & vacuuming w/ swiffer - Cues to avoid bending and reaching with mopping/vacuuming, pt needs to tuck elbows against body and step fwd w/ device   05/03/23 THERAPEUTIC EXERCISE: To improve strength and endurance.  Demonstration, verbal and tactile cues throughout for technique.  NuStep - L4 x 6 min (UE/LE) Seated LAQ - limited by posterior LE pain, re-attempted after HS stretches with a little better tolerance x 5 bil Seated hip hinge HS stretch 5 x ~5"  Seated RTB B hip ABD/ER clam x 10 Seated RTB alt hip flexion march x 10 Bridge 2 x 10, 2nd set with looped YTB hip ABD isometric  Standing alt hip extension x 10, UE support on back of chair for balance Seated Fitter leg press (1 black) x 10 bil  THERAPEUTIC ACTIVITIES: To improve functional performance.  Demonstration, verbal and tactile cues throughout for technique. TUG: 13.5 sec   PATIENT EDUCATION:  Education details: progress with PT, continue with current HEP, and [employer]  Person educated: Patient Education method: Explanation Education comprehension: verbalized understanding  HOME EXERCISE PROGRAM: Access Code: [email or web address] URL: [email or web address] Date: 04/24/2023 Prepared by: Glenetta Hew  Patient Education - Check for Safety - [place] Program   ASSESSMENT:  CLINICAL IMPRESSION: Penelopi reports she is not feeling well today and was just diagnosed with thrush.  Despite not feeling well, she was able to continue to progress standing activities for balance as well as working toward functional movement with step-ups to build confidence and  tolerance for stair climbing.  She did require intermittent seated rest breaks during activities today due to fatigue and noted some exercise related muscle soreness, but notes improving balance and stability.  Gait speed has increased from 2.36 ft/sec to 2.78 ft/sec, meeting LTG #6 and allowing for more community ambulation appropriate speed.  She denies any specific issues with the [facility] program but admits to needing more practice with it at home.  Encouraged her to bring the [place] booklet with her to the next visit for review and advancement as indicated.  Mariaeduarda will benefit from continued skilled PT to improve mobility and ongoing strength deficits to reach functional mobility and independence.  OBJECTIVE IMPAIRMENTS: Abnormal gait, decreased activity tolerance, decreased balance, decreased coordination, decreased endurance, decreased knowledge  of condition, decreased knowledge of use of DME, decreased mobility, difficulty walking, decreased ROM, decreased strength, decreased safety awareness, dizziness, increased fascial restrictions, impaired perceived functional ability, increased muscle spasms, impaired flexibility, impaired sensation, impaired UE functional use, improper body mechanics, postural dysfunction, and pain.   ACTIVITY LIMITATIONS: carrying, lifting, bending, standing, squatting, sleeping, stairs, transfers, bed mobility, and locomotion level  PARTICIPATION LIMITATIONS: meal prep, cleaning, laundry, driving, shopping, community activity, and church  PERSONAL FACTORS: Age, Fitness, Past/current experiences, Social background, Time since onset of injury/illness/exacerbation, and 3+ comorbidities: Extensive PMH including chronic back and neck pain, OA, osteoporosis, DM, HTN, h/o TIA, diabetic peripheral neuropathy, PVD, headaches, sleep dysfunction, anxiety, R breast cancer s/p lumpectomy 07/31/22, SVT (refer to above problem list and past medical/surgical history for full PMH)   are also  affecting patient's functional outcome.   REHAB POTENTIAL: Good  CLINICAL DECISION MAKING: Unstable/unpredictable  EVALUATION COMPLEXITY: High   GOALS: Goals reviewed with patient? Yes  SHORT TERM GOALS: Target date: 05/01/2023  Patient will be independent with initial HEP to improve outcomes and carryover.  Baseline:  Goal status: MET - 05/01/23 - pt denies any questions or concerns with [place] fall prevention program  2.  Patient will be educated on strategies to decrease risk of falls.  Baseline:  Goal status: MET - 04/24/23  3.  Patient will demonstrate decreased TUG time to </= 13.5 sec to decrease risk for falls with transitional mobility. Baseline:  Goal status: MET - 13.5 sec - 05/03/23  LONG TERM GOALS: Target date: 05/29/2023  Patient will be independent with advanced/ongoing HEP to facilitate ability to maintain/progress functional gains from skilled physical therapy services. Baseline:  Goal status: IN PROGRESS  2.  Patient will be able to ambulate 600' with or w/o LRAD on variable surfaces with good safety to access community.  Baseline:  Goal status: IN PROGRESS  3.  Patient will be able to step up/down curb safely with or w/o LRAD for safety with community ambulation.  Baseline:  Goal status: IN PROGRESS   4.  Patient will demonstrate improved B LE strength to >/= 4/5 for improved stability and ease of mobility. Baseline: Refer to above LE MMT table Goal status: IN PROGRESS  5.  Patient will improve 5x STS time to </= 15 seconds for improved efficiency and safety with transfers. Baseline: 30.43 sec (04/09/23) Goal status: IN PROGRESS   6.  Patient will demonstrate gait speed of >/= 2.62 ft/sec (0.8 m/s) to be a safe community ambulator with decreased risk for recurrent falls.  Baseline: 2.36 ft/sec (04/09/23) Goal status: MET - 05/15/23 - 2.78 ft/sec  7.  Patient will improve Berg score by at least 8 points to improve safety and stability with ADLs in standing  and reduce risk for falls.  Baseline: 44/56 (04/09/23) Goal status: IN PROGRESS  8.  Patient will demonstrate at least 19/24 on DGI or 19/30 on FGA to improve gait stability and reduce risk for falls. Baseline: 15/24 (04/09/23) Goal status: IN PROGRESS  9. Patient will report >/= 50% on ABC scale to demonstrate improved balance confidence with functional mobility and gait. Baseline: 580 / 1600 = 36.3 % Goal status: IN PROGRESS   PLAN:  PT FREQUENCY: 2x/week  PT DURATION: 8 weeks  PLANNED INTERVENTIONS: 97164- PT Re-evaluation, 97110-Therapeutic exercises, 97530- Therapeutic activity, [id number]- Neuromuscular re-education, 97535- Self Care, 13086- Manual therapy, [id number]- Gait training, [phone]- Canalith repositioning, [id number]- Electrical stimulation (unattended), 97035- Ultrasound, 28413- Ionotophoresis 4mg /ml Dexamethasone, Patient/Family education, Balance training, Stair training,  Taping, Dry Needling, Joint mobilization, Spinal mobilization, Vestibular training, Cryotherapy, Moist heat, and 21308- Physical performance test or measurement  PLAN FOR NEXT SESSION: Review [place] fall prevention program PRN; LE strengthening; balance training   Marry Guan, PT 05/15/2023, 3:04 PM    Date of referral: 01/16/2023 Referring provider: [name], MD Referring diagnosis?  M79.10 (ICD-10-CM) - Myalgia  R26.81 (ICD-10-CM) - Unsteady gait  R53.1 (ICD-10-CM) - Weakness  Treatment diagnosis? (if different than referring diagnosis)  Muscle weakness (generalized)  Other abnormalities of gait and mobility  Unsteadiness on feet  Pain in left hip  What was this (referring dx) caused by? Ongoing Issue and Other: Sequelae of treatments for breast cancer  Nature of Condition: Chronic (continuous duration > 3 months)   Laterality: Lt  Current Functional Measure Score: Other ABC scale = 580 / 1600 = 36.3 %  Objective measurements identify impairments when they are compared to normal values, the  uninvolved extremity, and prior level of function.  [x]  Yes  []  No  Objective assessment of functional ability: Severe functional limitations   Briefly describe symptoms: Patient presents with physical impairments of L anterior lateral hip pain, impaired activity tolerance, global UE/LE weakness, dizziness, impaired standing balance, impaired ambulation, inability to navigate stairs and decreased safety awareness impacting safe and independent functional mobility.    How did symptoms start: Gradual onset exacerbated by treatment for breast cancer starting May 2024  Average pain intensity:  Last 24 hours: 6/10  Past week: up to 6-10/10  How often does the pt experience symptoms? Constantly  How much have the symptoms interfered with usual daily activities? Extremely  How has condition changed since care began at this facility? A little better  In general, how is the patients overall health? Fair  Onset date: Chronic   BACK PAIN (STarT Back Screening Tool) - (When applicable): N/A  Has your back pain spread down your leg(s) at sometime in the last 2 weeks? []  Yes   []  No Have you had pain in the shoulder or neck at sometime in the past 2 weeks? []  Yes   []  No Have you only walked short distances because of your back pain? []  Yes   []  No In the past 2 weeks, have you dressed more slowly than usual because of your back pain? []  Yes   []  No Do you think it is not really safe for person with a condition like yours to be physically active? []  Yes   []  No Have worrying thoughts been going through your mind a lot of the time? []  Yes   []  No Do you feel that your back pain is terrible and it is never going to get any better? []  Yes   []  No In general, have you stopped enjoying all the things you usually enjoy? []  Yes   []  No Overall, how bothersome has your back pain been in the last 2 weeks? []  Not at all   []  Slightly     []  Moderate   []  Very much     []  Extremely

## 2023-05-17 ENCOUNTER — Ambulatory Visit: Admitting: Physical Therapy

## 2023-05-17 ENCOUNTER — Encounter: Payer: Self-pay | Admitting: Physical Therapy

## 2023-05-17 DIAGNOSIS — R2681 Unsteadiness on feet: Secondary | ICD-10-CM

## 2023-05-17 DIAGNOSIS — M6281 Muscle weakness (generalized): Secondary | ICD-10-CM | POA: Diagnosis not present

## 2023-05-17 DIAGNOSIS — M25552 Pain in left hip: Secondary | ICD-10-CM | POA: Diagnosis not present

## 2023-05-17 DIAGNOSIS — R2689 Other abnormalities of gait and mobility: Secondary | ICD-10-CM

## 2023-05-17 NOTE — Therapy (Signed)
 OUTPATIENT PHYSICAL THERAPY TREATMENT   Patient Name: [name] MRN: [id number] DOB:August 02, 1940, 83 y.o., female Today's Date: 05/17/2023   END OF SESSION:  PT End of Session - 05/17/23 1405     Visit Number 9    Date for PT Re-Evaluation 05/29/23    Authorization Type [employer] Medicare    Authorization Time Period 05/08/23 - 06/05/23    Authorization - Visit Number 3    Authorization - Number of Visits 8    Progress Note Due on Visit 10    PT Start Time 1406   Pt arrived late   PT Stop Time 1448    PT Time Calculation (min) 42 min    Activity Tolerance Patient tolerated treatment well;Patient limited by fatigue    Behavior During Therapy [facility] for tasks assessed/performed                 Past Medical History:  Diagnosis Date   Abdominal pain in female 03/18/2010   Qualifier: Diagnosis of  By: Leone Payor MD, Alfonse Ras E    Anemia 06/08/2014   Anxiety    Arthritis    Spinal Osteoarthritis   Breast cancer (HCC)    Carcinoid tumor of stomach    Cataract    Chest pain    Myoview 12/15 no ischemia.   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 09/05/2006   Qualifier: Diagnosis of  By: Charlsie Quest RMA, Lucy     Diabetic peripheral neuropathy (HCC) 10/29/2013   Encounter for preventative adult health care exam with abnormal findings 09/14/2013   Esophageal reflux    Gastric polyp    Fundic Gland   Gastroparesis    Headache(784.0)    Heart murmur    Echocardiogram 2/11: EF 60-65%, mild LAE, grade 1 diastolic dysfunction, aortic valve sclerosis, mean gradient 9 mm of mercury, PASP 34   Hematuria 03/16/2016   Iron deficiency anemia, unspecified    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   Leg swelling    bilateral   Neck pain 04/22/2015   PONV (postoperative nausea and vomiting)    pt states body temperature drops every time she has anesthesia; pt states only needs small amount of anesthesia    PSVT (paroxysmal supraventricular tachycardia) (HCC)    Pure hypercholesterolemia    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   Renal insufficiency 06/18/2019   pt states  L kidney function very low-functions at about 20%   Stroke [facility])    tia, 2014   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   Type II or unspecified type diabetes mellitus without mention of complication, not stated as uncontrolled    Unspecified essential hypertension    Unspecified hereditary and idiopathic peripheral neuropathy 10/29/2013   Vitamin D deficiency    Past Surgical History:  Procedure Laterality Date   BREAST BIOPSY Right 06/13/2022   [place] RT BREAST BX W LOC DEV 1ST LESION IMG BX SPEC US GUIDE 06/13/2022 [facility]   BREAST LUMPECTOMY Right 07/31/2022   Procedure: RIGHT BREAST LUMPECTOMY;  Surgeon: Abigail Miyamoto, MD;  Location: [facility];  Service: General;  Laterality: Right;   CHOLECYSTECTOMY  1993   COLONOSCOPY  11/11/2010   diverticulosis   DILATATION & CURRETTAGE/HYSTEROSCOPY WITH RESECTOCOPE N/A 02/25/2013   Procedure: Attempted hysteroscopy with uterine perforation;  Surgeon: Jacqualin Combes de Gwenevere Ghazi, MD;  Location: [facility];  Service: Gynecology;  Laterality: N/A;   ESOPHAGOGASTRODUODENOSCOPY  08/29/2010; 09/15/2010  Carcinoid tumor less than 1 cm in July 2012 not seen in August 2012 , gastritis, fundic gland polyps   ESOPHAGOGASTRODUODENOSCOPY  05/16/2011   ESOPHAGOGASTRODUODENOSCOPY  06/14/2012   EUS  12/15/2010   Procedure: UPPER ENDOSCOPIC ULTRASOUND (EUS) LINEAR;  Surgeon: Rob Bunting, MD;  Location: [facility];  Service: Endoscopy;  Laterality: N/A;   EYE SURGERY Bilateral    Bi lateral cateracts and bi lateral laser   LAPAROSCOPY N/A 02/25/2013   Procedure: Cystoscopy and laparoscopy with fulguration of uterine serosa;  Surgeon: Jacqualin Combes de Gwenevere Ghazi, MD;  Location: [facility];  Service: Gynecology;  Laterality: N/A;   TONSILLECTOMY     Patient Active Problem List   Diagnosis Date  Noted   Precordial chest pain 12/24/2022   Carcinoma of breast upper outer quadrant, right (HCC) 07/04/2022   SVT (supraventricular tachycardia) (HCC) 06/21/2022   Left-sided weakness 04/02/2022   Unsteady gait 04/02/2022   Oral lesion 03/08/2021   Sun-damaged skin 03/08/2021   Thiamine deficiency 11/01/2020   Trigeminal neuralgia 08/21/2020   Pedal edema 08/21/2020   Chronic left-sided back pain 03/28/2020   Nocturia 03/28/2020   Left-sided headache 03/28/2020   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   Renal insufficiency 06/18/2019   Educated about COVID-19 virus infection 05/15/2019   Atrophic vaginitis 01/20/2019   Stenosis of carotid artery 11/12/2018   Anxiety 10/13/2018   Referred otalgia of left ear 04/23/2018   Chronic throat clearing 04/23/2018   Sinusitis 10/11/2017   Headache 07/12/2017   Dizzy spells 11/21/2016   Constipation 11/21/2016   Nonrheumatic aortic valve stenosis 05/23/2016   Aortic atherosclerosis (HCC) 05/23/2016   Hematuria 03/16/2016   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   Pain of upper abdomen 06/06/2015   Neck pain 04/22/2015   Ear pain 02/28/2015   Abnormal urine 11/21/2014   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/2014   Anemia 06/08/2014   RLS (restless legs syndrome) 11/23/2013   Diabetic peripheral neuropathy (HCC) 10/29/2013   Left-sided thoracic back pain 10/06/2013   Encounter for preventative adult health care exam with abnormal findings 09/14/2013   Iron deficiency anemia    Status post laparoscopy 02/25/2013   Hyponatremia 01/09/2013   GERD (gastroesophageal reflux disease) 01/09/2013   Amaurosis fugax of left eye 10/16/2012   Low back pain 06/03/2012   Vitamin D deficiency 03/11/2012   Bilateral hand pain 10/20/2011   Encounter for long-term (current) use of other medications 10/20/2011   IBS (irritable bowel syndrome) 08/14/2011   TIA (transient ischemic attack) 02/10/2011   Abnormal brain CT 01/19/2011   Allergic rhinitis  10/01/2010   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   Preventative health care 07/15/2010   FUNDIC GLAND POLYPS OF STOMACH 03/18/2010   Abdominal pain in female 03/18/2010   Left hip pain 03/17/2009   SYSTOLIC MURMUR 03/02/2009   Paroxysmal supraventricular tachycardia (HCC) 01/12/2009   PLANTAR FASCIITIS 06/08/2008   CHEST PAIN 05/18/2008   Gastroparesis 12/18/2007   HYPERCHOLESTEROLEMIA 06/11/2007   Type 2 diabetes mellitus with obesity (HCC) 09/05/2006   Essential hypertension 09/05/2006    PCP: [name], MD   REFERRING PROVIDER: [name], MD   REFERRING DIAG:  M79.10 (ICD-10-CM) - Myalgia  R26.81 (ICD-10-CM) - Unsteady gait  R53.1 (ICD-10-CM) - Weakness   THERAPY DIAG:  Muscle weakness (generalized)  Other abnormalities of gait and mobility  Unsteadiness on feet  Pain in left hip  RATIONALE FOR EVALUATION AND TREATMENT: Rehabilitation  ONSET DATE: Worsening over the past year,  especially since breast cancer diagnosis and May 2024  NEXT MD VISIT: 04/23/2023   SUBJECTIVE:                                                                                                                                                                                                         SUBJECTIVE STATEMENT: Mele complains of increased tired/fatigue today - almost didn't come to PT today.  EVAL: Pt reports her weakness and pains seems to be exacerbated by recent meds she has had to take. She notes difficulty climbing stairs and does not feel safe using a step stool to reach high things.  She feels like she has no energy.  She reports chronic belly pain, maybe from meds vs diverticulitis.  L hip pain has been an issue for several months but patient has been told there is no "bony" reason for her pain (x-rays negative).  PAIN: Are you having pain? No and Yes: NPRS scale: 0/10  Pain location: L lateral hip Pain description: dull, sharp when more intense   Aggravating factors: lying on side to sleep (worse when lying on L than R), first moving in the morning Relieving factors: walking, Tylenol   Are you having pain? Yes: NPRS scale: 8/10 Pain location: L>R upper shoulders Pain description: pinching Aggravating factors: uncertain Relieving factors: keep moving  PERTINENT HISTORY:  Extensive PMH including chronic back and neck pain, OA, osteoporosis, DM, HTN, h/o TIA, diabetic peripheral neuropathy, PVD, headaches, sleep dysfunction, anxiety, R breast cancer s/p lumpectomy 07/31/22, SVT (refer to above problem list and past medical/surgical history for full PMH)   PRECAUTIONS: Fall  RED FLAGS: None  WEIGHT BEARING RESTRICTIONS: No  FALLS:  Has patient fallen in last 6 months? No - admits to a few close calls due to dizziness/lightheadness  LIVING ENVIRONMENT: Lives with: lives alone  Lives in: House/apartment Stairs: No Has following equipment at home: Single point cane, Environmental consultant - 4 wheeled, shower chair, and Grab bars  OCCUPATION: Retired  PLOF: Independent, Needs assistance with homemaking, Leisure: read and play card games on her phone, traveling, and housekeeping/cleaning service ~1x/month    PATIENT GOALS: "To be able to walk steady and feel comfortable going up stairs so I can visit my niece and nephew. To be able to carry groceries."   OBJECTIVE: (objective measures completed at initial evaluation unless otherwise dated)  DIAGNOSTIC FINDINGS:  01/22/23 & 10/05/22 - DH Left hip (2 several months of L hip pain) IMPRESSION: Negative.  COGNITION: Overall cognitive status: Within functional limits for tasks assessed   SENSATION: Numbness  and tingling in hands and feet from DM and cervical radiculopathy  POSTURE:  rounded shoulders, forward head, increased thoracic kyphosis, and flexed trunk   PALPATION: Increased TTP over L TFL, hip flexors and abductors  UPPER EXTREMITY MMT:  MMT Right eval Left eval  Shoulder  flexion 4- 4-  Shoulder extension 3+ 3+  Shoulder abduction 4- 4-  Shoulder adduction    Shoulder internal rotation 4- 4-  Shoulder external rotation 3+ 3+  Middle trapezius    Lower trapezius     (Blank rows = not tested)  MUSCLE LENGTH: Hamstrings: mod tight B ITB: mod tight B  Piriformis: severe tight B Hip flexors: mild/mod tight L>R  Quads: mild/mod tight L>R  Heelcord: mod tight B  LOWER EXTREMITY ROM:    Mildly limited primarily due to muscle tightness  LOWER EXTREMITY MMT:  (tested in sitting on eval)  MMT Right eval Left eval  Hip flexion 3+ 3+  Hip extension 3- 3-  Hip abduction 3+ 3+  Hip adduction 3+ 3+  Hip internal rotation 3- 3-  Hip external rotation 3- 3-  Knee flexion 4- 4-  Knee extension 4- 4  Ankle dorsiflexion 3+ 4-  Ankle plantarflexion    Ankle inversion    Ankle eversion    (Blank rows = not tested)  BED MOBILITY:  Sit to supine Min A Supine to sit Min A Rolling to Right SBA  TRANSFERS: Assistive device utilized: None  Sit to stand: Complete Independence Stand to sit: Complete Independence Chair to chair: Modified independence Floor:  NT  GAIT: Distance walked: Clinic distances Assistive device utilized: None Level of assistance: Complete Independence and SBA Gait pattern: decreased stride length and trunk flexed Comments: Decreased gait speed  RAMP: Level of Assistance:  NT Assistive device utilized:    Ramp Comments:   CURB:  Level of Assistance:  NT Assistive device utilized:    Curb Comments:   STAIRS: Level of Assistance: SBA Stair Negotiation Technique: Step to Pattern, Sideways (descent), Forwards (ascent) with Single Rail on Right Number of Stairs: 14  Height of Stairs: 7"  Comments: forward for ascent, sideways leading with R foot for descent    FUNCTIONAL TESTS: (TBA next visit) 5 times sit to stand: 30.43 sec; >15 sec indicates a risk for recurrent falls  Timed up and go (TUG): 16.03 sec; >13.5 sec  indicates a high risk for falls  10 meter walk test: 13.91 sec ; Gait speed = 2.36 ft/sec; >/=2.62 ft/sec is necessary to be considered a Materials engineer Scale: 44/56; Scores of 37-45 indicate significant risk for falls (>80%)  Dynamic Gait Index: 15/24; Scores of 19 or less are predictive of falls in older community living adults.    05/03/23   TUG: 13.5 sec  05/15/23 = 11.78 sec Gait speed = 2.78 ft/sec  PATIENT SURVEYS:  ABC scale 580 / 1600 = 36.3 % , scores of <50% indicating a low level of physical functioning and scores of <67% indicating a risk for falls in patients with vestibular disorders   TODAY'S TREATMENT:   05/17/23 THERAPEUTIC EXERCISE: To improve strength and endurance.  Demonstration, verbal and tactile cues throughout for technique.  NuStep - L4 x 6 min (UE/LE)  MANUAL THERAPY: To promote normalized muscle tension and reduced pain utilizing connective tissue massage, therapeutic massage, and manual TP therapy. STM/DTM to L UT, LS and periscapular muscles  NEUROMUSCULAR RE-EDUCATION: To improve balance, coordination, kinesthesia, posture, and proprioception. Seated on dynadisc  on mat table with feet on Airex pad atop 4" step YTB scap retraction + B shoulder rows x 10 YTB scap retraction + B shoulder extension x 10 YTB B pallof press x 10 Hip flexion march x 10 - pt c/o R hip/groin pain at end of set Attempted LAQ but discontinued d/t R hip pain Seated alt LAQ x 10 - pt reports better tolerance  THERAPEUTIC ACTIVITIES: To improve functional performance.  Demonstration, verbal and tactile cues throughout for technique. Fwd step-up to 6" step x 10 bil, single UE support on back of chair to simulate handrail B lateral step-up to 6" step with B UE support on window sill x 5   05/15/23 THERAPEUTIC EXERCISE: To improve strength and endurance.  Demonstration, verbal and tactile cues throughout for technique.  NuStep - L4 x 6 min (UE/LE) BATCA  seated row 5# 2 x 10 BATCA seated knee flexion 15# x 10  NEUROMUSCULAR RE-EDUCATION: To improve balance, coordination, kinesthesia, proprioception, and reduce fall risk. Alt toe clears to 9" stool x 10 Fwd & back alt single step-over 1/2 foam roll x 10 Lateral double LE step-over 1/2 foam roll x 10  THERAPEUTIC ACTIVITIES: To improve functional performance.  Demonstration, verbal and tactile cues throughout for technique. Fwd step-up to 6" step x 5 bil, single UE support on back of chair to simulate handrail = 11.78 sec Gait speed = 2.78 ft/sec   05/08/23 THERAPEUTIC EXERCISE: To improve strength and endurance.  Demonstration, verbal and tactile cues throughout for technique. UBE 1.0 x (3 min fwd, 3 min backward) Seated knee extension + 1lb ankle weights x10 B Standing hip ABD + 1lb ankle weight w/ support x10 B Standing HS curls + 1lb ankle weight w/ support x10 B  NEUROMUSCULAR RE-EDUCATION: To improve kinesthesia and posture. Wall Slides + scapular retraction x6 - Stopped due to pain, notices shoulder shrug; after scapular exercises, tried this again w/ reduced shoulder shrug x5 Standing YTB + scapular retraction row 2 x 10 Standing YTB scap retraction + B shoulder extension 2 x 10 Reactive isometric B shoulder flexion step out YTB - x10  SELF CARE: Provided education  options to progress HEP by adding ankle weight . Simulated mopping & vacuuming w/ swiffer - Cues to avoid bending and reaching with mopping/vacuuming, pt needs to tuck elbows against body and step fwd w/ device   PATIENT EDUCATION:  Education details: progress with PT, continue with current HEP, and [employer]  Person educated: Patient Education method: Explanation Education comprehension: verbalized understanding  HOME EXERCISE PROGRAM: Access Code: [email or web address] URL: [email or web address] Date: 04/24/2023 Prepared by: Glenetta Hew  Patient Education - Check for  Safety - [place] Program   ASSESSMENT:  CLINICAL IMPRESSION: [name] reports increased fatigue/tired today potentially related to the thrush for which she started treatment earlier this week.  She c/o L upper shoulder pain today with tight muscles which responded well to MT with decreased pain reported.  Reinforced postural and core muscle activation and strengthening from sitting today due to fatigue but utilizing unstable surfaces to challenge balance and core.  Pt feeling a little better by end of session, therefore concluded session with further stair practice with forward and lateral step-ups.  She forgot her [place] booklet today, therefore encouraged her to bring the [place] booklet with her to the next visit for review and advancement as indicated.  Ica will benefit from continued skilled PT to improve mobility and ongoing strength deficits to reach functional mobility  and independence.  OBJECTIVE IMPAIRMENTS: Abnormal gait, decreased activity tolerance, decreased balance, decreased coordination, decreased endurance, decreased knowledge of condition, decreased knowledge of use of DME, decreased mobility, difficulty walking, decreased ROM, decreased strength, decreased safety awareness, dizziness, increased fascial restrictions, impaired perceived functional ability, increased muscle spasms, impaired flexibility, impaired sensation, impaired UE functional use, improper body mechanics, postural dysfunction, and pain.   ACTIVITY LIMITATIONS: carrying, lifting, bending, standing, squatting, sleeping, stairs, transfers, bed mobility, and locomotion level  PARTICIPATION LIMITATIONS: meal prep, cleaning, laundry, driving, shopping, community activity, and church  PERSONAL FACTORS: Age, Fitness, Past/current experiences, Social background, Time since onset of injury/illness/exacerbation, and 3+ comorbidities: Extensive PMH including chronic back and neck pain, OA, osteoporosis, DM, HTN, h/o TIA,  diabetic peripheral neuropathy, PVD, headaches, sleep dysfunction, anxiety, R breast cancer s/p lumpectomy 07/31/22, SVT (refer to above problem list and past medical/surgical history for full PMH)   are also affecting patient's functional outcome.   REHAB POTENTIAL: Good  CLINICAL DECISION MAKING: Unstable/unpredictable  EVALUATION COMPLEXITY: High   GOALS: Goals reviewed with patient? Yes  SHORT TERM GOALS: Target date: 05/01/2023  Patient will be independent with initial HEP to improve outcomes and carryover.  Baseline:  Goal status: MET - 05/01/23 - pt denies any questions or concerns with [place] fall prevention program  2.  Patient will be educated on strategies to decrease risk of falls.  Baseline:  Goal status: MET - 04/24/23  3.  Patient will demonstrate decreased TUG time to </= 13.5 sec to decrease risk for falls with transitional mobility. Baseline:  Goal status: MET - 13.5 sec - 05/03/23  LONG TERM GOALS: Target date: 05/29/2023  Patient will be independent with advanced/ongoing HEP to facilitate ability to maintain/progress functional gains from skilled physical therapy services. Baseline:  Goal status: IN PROGRESS  2.  Patient will be able to ambulate 600' with or w/o LRAD on variable surfaces with good safety to access community.  Baseline:  Goal status: IN PROGRESS  3.  Patient will be able to step up/down curb safely with or w/o LRAD for safety with community ambulation.  Baseline:  Goal status: IN PROGRESS   4.  Patient will demonstrate improved B LE strength to >/= 4/5 for improved stability and ease of mobility. Baseline: Refer to above LE MMT table Goal status: IN PROGRESS  5.  Patient will improve 5x STS time to </= 15 seconds for improved efficiency and safety with transfers. Baseline: 30.43 sec (04/09/23) Goal status: IN PROGRESS   6.  Patient will demonstrate gait speed of >/= 2.62 ft/sec (0.8 m/s) to be a safe community ambulator with decreased risk  for recurrent falls.  Baseline: 2.36 ft/sec (04/09/23) Goal status: MET - 05/15/23 - 2.78 ft/sec  7.  Patient will improve Berg score by at least 8 points to improve safety and stability with ADLs in standing and reduce risk for falls.  Baseline: 44/56 (04/09/23) Goal status: IN PROGRESS  8.  Patient will demonstrate at least 19/24 on DGI or 19/30 on FGA to improve gait stability and reduce risk for falls. Baseline: 15/24 (04/09/23) Goal status: IN PROGRESS  9. Patient will report >/= 50% on ABC scale to demonstrate improved balance confidence with functional mobility and gait. Baseline: 580 / 1600 = 36.3 % Goal status: IN PROGRESS   PLAN:  PT FREQUENCY: 2x/week  PT DURATION: 8 weeks  PLANNED INTERVENTIONS: 97164- PT Re-evaluation, 97110-Therapeutic exercises, 97530- Therapeutic activity, [id number]- Neuromuscular re-education, 97535- Self Care, 16109- Manual therapy, [id number]- Gait training, [phone]-  Canalith repositioning, [id number]- Electrical stimulation (unattended), 97035- Ultrasound, 46962- Ionotophoresis 4mg /ml Dexamethasone, Patient/Family education, Balance training, Stair training, Taping, Dry Needling, Joint mobilization, Spinal mobilization, Vestibular training, Cryotherapy, Moist heat, and 95284- Physical performance test or measurement  PLAN FOR NEXT SESSION: Review [place] fall prevention program PRN; LE strengthening - functional emphasis for stair negotiation; balance training   Marry Guan, PT 05/17/2023, 3:01 PM    Date of referral: 01/16/2023 Referring provider: [name], MD Referring diagnosis?  M79.10 (ICD-10-CM) - Myalgia  R26.81 (ICD-10-CM) - Unsteady gait  R53.1 (ICD-10-CM) - Weakness  Treatment diagnosis? (if different than referring diagnosis)  Muscle weakness (generalized)  Other abnormalities of gait and mobility  Unsteadiness on feet  Pain in left hip  What was this (referring dx) caused by? Ongoing Issue and Other: Sequelae of treatments for breast  cancer  Nature of Condition: Chronic (continuous duration > 3 months)   Laterality: Lt  Current Functional Measure Score: Other ABC scale = 580 / 1600 = 36.3 %  Objective measurements identify impairments when they are compared to normal values, the uninvolved extremity, and prior level of function.  [x]  Yes  []  No  Objective assessment of functional ability: Severe functional limitations   Briefly describe symptoms: Patient presents with physical impairments of L anterior lateral hip pain, impaired activity tolerance, global UE/LE weakness, dizziness, impaired standing balance, impaired ambulation, inability to navigate stairs and decreased safety awareness impacting safe and independent functional mobility.    How did symptoms start: Gradual onset exacerbated by treatment for breast cancer starting May 2024  Average pain intensity:  Last 24 hours: 6/10  Past week: up to 6-10/10  How often does the pt experience symptoms? Constantly  How much have the symptoms interfered with usual daily activities? Extremely  How has condition changed since care began at this facility? A little better  In general, how is the patients overall health? Fair  Onset date: Chronic   BACK PAIN (STarT Back Screening Tool) - (When applicable): N/A  Has your back pain spread down your leg(s) at sometime in the last 2 weeks? []  Yes   []  No Have you had pain in the shoulder or neck at sometime in the past 2 weeks? []  Yes   []  No Have you only walked short distances because of your back pain? []  Yes   []  No In the past 2 weeks, have you dressed more slowly than usual because of your back pain? []  Yes   []  No Do you think it is not really safe for person with a condition like yours to be physically active? []  Yes   []  No Have worrying thoughts been going through your mind a lot of the time? []  Yes   []  No Do you feel that your back pain is terrible and it is never going to get any better? []  Yes    []  No In general, have you stopped enjoying all the things you usually enjoy? []  Yes   []  No Overall, how bothersome has your back pain been in the last 2 weeks? []  Not at all   []  Slightly     []  Moderate   []  Very much     []  Extremely

## 2023-05-18 ENCOUNTER — Other Ambulatory Visit: Payer: Self-pay | Admitting: Family Medicine

## 2023-05-21 ENCOUNTER — Telehealth: Payer: Self-pay

## 2023-05-21 ENCOUNTER — Encounter: Payer: Self-pay | Admitting: Family Medicine

## 2023-05-21 ENCOUNTER — Ambulatory Visit (INDEPENDENT_AMBULATORY_CARE_PROVIDER_SITE_OTHER): Admitting: Family Medicine

## 2023-05-21 ENCOUNTER — Other Ambulatory Visit (HOSPITAL_BASED_OUTPATIENT_CLINIC_OR_DEPARTMENT_OTHER): Payer: Self-pay

## 2023-05-21 VITALS — BP 122/80 | HR 67 | Temp 97.8°F | Resp 18 | Ht 61.0 in | Wt 165.6 lb

## 2023-05-21 DIAGNOSIS — J020 Streptococcal pharyngitis: Secondary | ICD-10-CM | POA: Diagnosis not present

## 2023-05-21 DIAGNOSIS — H6992 Unspecified Eustachian tube disorder, left ear: Secondary | ICD-10-CM | POA: Diagnosis not present

## 2023-05-21 LAB — POCT RAPID STREP A (OFFICE): Rapid Strep A Screen: POSITIVE — AB

## 2023-05-21 MED ORDER — PREDNISONE 20 MG PO TABS
40.0000 mg | ORAL_TABLET | Freq: Every day | ORAL | 0 refills | Status: AC
  Filled 2023-05-21: qty 10, 5d supply, fill #0

## 2023-05-21 MED ORDER — AMOXICILLIN 500 MG PO CAPS
1000.0000 mg | ORAL_CAPSULE | Freq: Every day | ORAL | 0 refills | Status: AC
  Filled 2023-05-21: qty 20, 10d supply, fill #0

## 2023-05-21 NOTE — Patient Instructions (Signed)
 Throw out your toothbrush after 24 hours after starting the antibiotic.   Continue to push fluids, practice good hand hygiene, and cover your mouth if you cough.  If you start having fevers, shaking or shortness of breath, seek immediate care.  OK to take Tylenol 1000 mg (2 extra strength tabs) or 975 mg (3 regular strength tabs) every 6 hours as needed.  Let us  know if you need anything.

## 2023-05-21 NOTE — Telephone Encounter (Signed)
 Pt says when she was in office today she was advied to take 1 tab daily of the antibiotic. Her script is written for BID x 10 days. She would like clarification before she starts the Rx.

## 2023-05-21 NOTE — Progress Notes (Signed)
 Chief Complaint  Patient presents with   Sore Throat    Pt was seen on 05/15/23 by [name] and was given Nystatin. No improvement. Still having:  sore throat, tight feeling in the throat. Taking Tylenol as well.     [place] N Stretch here for URI complaints.  Duration: 10 days  Associated symptoms: ear pain, sore throat, and wheezing Denies: sinus congestion, sinus pain, rhinorrhea, ear drainage, wheezing, shortness of breath, myalgia, and fevers Treatment to date: Nystatin Sick contacts: No  Past Medical History:  Diagnosis Date   Abdominal pain in female 03/18/2010   Qualifier: Diagnosis of  By: Leone Payor MD, Alfonse Ras E    Anemia 06/08/2014   Anxiety    Arthritis    Spinal Osteoarthritis   Breast cancer (HCC)    Carcinoid tumor of stomach    Cataract    Chest pain    Myoview 12/15 no ischemia.   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 09/05/2006   Qualifier: Diagnosis of  By: Charlsie Quest RMA, Lucy     Diabetic peripheral neuropathy (HCC) 10/29/2013   Encounter for preventative adult health care exam with abnormal findings 09/14/2013   Esophageal reflux    Gastric polyp    Fundic Gland   Gastroparesis    Headache(784.0)    Heart murmur    Echocardiogram 2/11: EF 60-65%, mild LAE, grade 1 diastolic dysfunction, aortic valve sclerosis, mean gradient 9 mm of mercury, PASP 34   Hematuria 03/16/2016   Iron deficiency anemia, unspecified    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   Leg swelling    bilateral   Neck pain 04/22/2015   PONV (postoperative nausea and vomiting)    pt states body temperature drops every time she has anesthesia; pt states only needs small amount of anesthesia   PSVT (paroxysmal supraventricular tachycardia) (HCC)    Pure hypercholesterolemia    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   Renal insufficiency 06/18/2019   pt states  L kidney function very low-functions at about 20%   Stroke  [facility])    tia, 2014   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   Type II or unspecified type diabetes mellitus without mention of complication, not stated as uncontrolled    Unspecified essential hypertension    Unspecified hereditary and idiopathic peripheral neuropathy 10/29/2013   Vitamin D deficiency     Objective BP 122/80 (BP Location: Left Arm, Patient Position: Sitting, Cuff Size: Normal)   Pulse 67   Temp 97.8 F (36.6 C) (Temporal)   Resp 18   Ht 5\' 1"  (1.549 m)   Wt 165 lb 9.6 oz (75.1 kg)   LMP 02/07/1992   SpO2 98%   BMI 31.29 kg/m  General: Awake, alert, appears stated age HEENT: AT, [place] [address], ears patent b/l and TM on R neg, TM on L retracted, nares patent w/o discharge, pharynx pink and without exudates, MMM, no sinus ttp b/l Neck: No masses or asymmetry, TTP over L cerv LN's and submand gland Heart: RRR Lungs: CTAB, no accessory muscle use Psych: Age appropriate judgment and insight, normal mood and affect  Strep throat - Plan: POCT rapid strep A, amoxicillin (AMOXIL) 500 MG capsule  Dysfunction of left eustachian tube - Plan: predniSONE (DELTASONE) 20 MG tablet  Amoxicillin daily, 1 g for the next 10 days.  Throw out toothbrush after being on antibiotics for 24 hours.  5-day prednisone burst 40 mg daily.  She has tolerated  this well in the past.  This is for the ear.  Continue supportive care. F/u prn. If starting to experience fevers, shaking, or shortness of breath, seek immediate care. Pt voiced understanding and agreement to the plan.  Shellie Dials Ashdown, DO 05/21/23 2:21 PM

## 2023-05-22 ENCOUNTER — Other Ambulatory Visit (HOSPITAL_COMMUNITY): Payer: Self-pay

## 2023-05-22 ENCOUNTER — Other Ambulatory Visit (HOSPITAL_BASED_OUTPATIENT_CLINIC_OR_DEPARTMENT_OTHER): Payer: Self-pay

## 2023-05-22 ENCOUNTER — Ambulatory Visit: Admitting: Physical Therapy

## 2023-05-22 ENCOUNTER — Other Ambulatory Visit: Payer: Self-pay | Admitting: Cardiology

## 2023-05-22 NOTE — Telephone Encounter (Signed)
 Copied from CRM [id number]. Topic: Clinical - Medication Question >> May 21, 2023  3:19 PM Allyne Areola wrote: Reason for CRM: Patient picked up amoxicillin (AMOXIL) 500 MG capsule and predniSONE (DELTASONE) 20 MG tablet today and the pharmacist advised to take 2 per day; however, patient states Dr.Wendling advised to take one per day, she needs carnification on what instructions she should be following. Called spoke with Pt and was advised of medication changes. Pt stated understand.

## 2023-05-22 NOTE — Telephone Encounter (Signed)
Called pt was advised  

## 2023-05-22 NOTE — Telephone Encounter (Signed)
 It was sent as 2 capsules daily for 10 days. The pharmacy made an error. Sorry about that.

## 2023-05-23 ENCOUNTER — Encounter: Payer: Self-pay | Admitting: *Deleted

## 2023-05-23 ENCOUNTER — Encounter: Payer: Self-pay | Admitting: Hematology & Oncology

## 2023-05-23 ENCOUNTER — Inpatient Hospital Stay: Attending: Hematology & Oncology

## 2023-05-23 ENCOUNTER — Inpatient Hospital Stay: Admitting: Hematology & Oncology

## 2023-05-23 VITALS — BP 154/61 | HR 66 | Temp 98.4°F | Resp 18 | Wt 165.0 lb

## 2023-05-23 DIAGNOSIS — K909 Intestinal malabsorption, unspecified: Secondary | ICD-10-CM

## 2023-05-23 DIAGNOSIS — Z1721 Progesterone receptor positive status: Secondary | ICD-10-CM | POA: Insufficient documentation

## 2023-05-23 DIAGNOSIS — C50411 Malignant neoplasm of upper-outer quadrant of right female breast: Secondary | ICD-10-CM | POA: Insufficient documentation

## 2023-05-23 DIAGNOSIS — Z1732 Human epidermal growth factor receptor 2 negative status: Secondary | ICD-10-CM | POA: Diagnosis not present

## 2023-05-23 DIAGNOSIS — D509 Iron deficiency anemia, unspecified: Secondary | ICD-10-CM | POA: Diagnosis not present

## 2023-05-23 DIAGNOSIS — Z170 Estrogen receptor positive status [ER+]: Secondary | ICD-10-CM | POA: Insufficient documentation

## 2023-05-23 DIAGNOSIS — Z79811 Long term (current) use of aromatase inhibitors: Secondary | ICD-10-CM | POA: Diagnosis not present

## 2023-05-23 LAB — CBC WITH DIFFERENTIAL (CANCER CENTER ONLY)
Abs Immature Granulocytes: 0.05 10*3/uL (ref 0.00–0.07)
Basophils Absolute: 0 10*3/uL (ref 0.0–0.1)
Basophils Relative: 0 %
Eosinophils Absolute: 0 10*3/uL (ref 0.0–0.5)
Eosinophils Relative: 0 %
HCT: 36.6 % (ref 36.0–46.0)
Hemoglobin: 12.8 g/dL (ref 12.0–15.0)
Immature Granulocytes: 1 %
Lymphocytes Relative: 21 %
Lymphs Abs: 2.1 10*3/uL (ref 0.7–4.0)
MCH: 28.9 pg (ref 26.0–34.0)
MCHC: 35 g/dL (ref 30.0–36.0)
MCV: 82.6 fL (ref 80.0–100.0)
Monocytes Absolute: 0.7 10*3/uL (ref 0.1–1.0)
Monocytes Relative: 7 %
Neutro Abs: 7.2 10*3/uL (ref 1.7–7.7)
Neutrophils Relative %: 71 %
Platelet Count: 230 10*3/uL (ref 150–400)
RBC: 4.43 MIL/uL (ref 3.87–5.11)
RDW: 12.4 % (ref 11.5–15.5)
WBC Count: 10.2 10*3/uL (ref 4.0–10.5)
nRBC: 0 % (ref 0.0–0.2)

## 2023-05-23 LAB — CMP (CANCER CENTER ONLY)
ALT: 21 U/L (ref 0–44)
AST: 20 U/L (ref 15–41)
Albumin: 5.2 g/dL — ABNORMAL HIGH (ref 3.5–5.0)
Alkaline Phosphatase: 66 U/L (ref 38–126)
Anion gap: 15 (ref 5–15)
BUN: 24 mg/dL — ABNORMAL HIGH (ref 8–23)
CO2: 22 mmol/L (ref 22–32)
Calcium: 10.4 mg/dL — ABNORMAL HIGH (ref 8.9–10.3)
Chloride: 92 mmol/L — ABNORMAL LOW (ref 98–111)
Creatinine: 0.88 mg/dL (ref 0.44–1.00)
GFR, Estimated: >60 mL/min (ref >60)
Glucose, Bld: 156 mg/dL — ABNORMAL HIGH (ref 70–99)
Potassium: 4.2 mmol/L (ref 3.5–5.1)
Sodium: 129 mmol/L — ABNORMAL LOW (ref 135–145)
Total Bilirubin: 0.8 mg/dL (ref 0.0–1.2)
Total Protein: 7.7 g/dL (ref 6.5–8.1)

## 2023-05-23 LAB — IRON AND IRON BINDING CAPACITY (CC-WL,HP ONLY)
Iron: 134 ug/dL (ref 28–170)
Saturation Ratios: 35 % — ABNORMAL HIGH (ref 10.4–31.8)
TIBC: 384 ug/dL (ref 250–450)
UIBC: 250 ug/dL (ref 148–442)

## 2023-05-23 LAB — LACTATE DEHYDROGENASE: LDH: 155 U/L (ref 98–192)

## 2023-05-23 LAB — FERRITIN: Ferritin: 172 ng/mL (ref 11–307)

## 2023-05-23 NOTE — Progress Notes (Signed)
 Hematology and Oncology Follow Up Visit  [name] [id number] [birth year] 83 y.o. 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 daily - d/c on 05/24/2023 Feraheme --  last given 04/19/2020     Interim History:  [name] is back for follow-up.  Unfortunately, is that she may be having a problem with the Arimidex.  She says that after couple weeks of taking it, she had a lot of dryness.  She had her throat was very sore and felt like it was closing up on her.  She was found to have strep throat recently.  She currently is on amoxicillin.  I am going to have her stop the Arimidex.  Again, it is possible that she just may not be able to tolerate the aromatase inhibitor category of antiestrogens.  Otherwise she is managing.  She is having no diarrhea.  She is having no bleeding.  There has been no leg swelling.  She does have diabetes.  Her blood sugars extremely be contributing to some of the dryness.  She says she does urinate quite a bit.  She has had no rashes.  She says there is little bit of a rash on her cheeks.  Again she thinks this may be from the Arimidex.  Currently, I would have to say that her performance status is probably ECOG 2.    Medications:  Current Outpatient Medications:    acetaminophen (TYLENOL) 325 MG tablet, Take 2 tablets (650 mg total) by mouth every 6 (six) hours as needed for up to 30 doses for mild pain or moderate pain., Disp: 30 tablet, Rfl: 0   amLODipine (NORVASC) 5 MG tablet, TAKE 1 TABLET BY MOUTH DAILY, Disp: 100 tablet, Rfl: 2   amoxicillin (AMOXIL) 500 MG capsule, Take 2 capsules (1,000 mg total) by mouth daily for 10 days., Disp: 20 capsule, Rfl: 0   anastrozole  (ARIMIDEX) 1 MG tablet, Take 1 tablet (1 mg total) by mouth daily., Disp: 90 tablet, Rfl: 6   aspirin EC 81 MG tablet, Take 81 mg by mouth daily. Swallow whole., Disp: , Rfl:    betamethasone valerate ointment (VALISONE) 0.1 %, Apply a small amount to affected area topically every other day., Disp: 45 g, Rfl: 1   Blood Glucose Monitoring Suppl (ONE TOUCH ULTRA 2) w/Device KIT, USE TO CHECK BLOOD SUGAR AS DIRECTED, Disp: 1 kit, Rfl: 0   calcium-vitamin D (OSCAL WITH D) 500-5 MG-MCG tablet, Take 1 tablet by mouth daily., Disp: , Rfl:    cholecalciferol (VITAMIN D) 1000 UNITS tablet, Take 1,000 Units by mouth daily., Disp: , Rfl:    dicyclomine (BENTYL) 10 MG capsule, Take 1 capsule (10 mg total) by mouth as needed for spasms., Disp: 60 capsule, Rfl: 0   famotidine (PEPCID) 40 MG tablet, Take 1 tablet (40 mg total) by mouth daily., Disp: 30 tablet, Rfl: 4   furosemide (LASIX) 20 MG tablet, TAKE 1 TABLET BY MOUTH DAILY AS  NEEDED, Disp: 100 tablet, Rfl: 2   hyoscyamine (LEVSIN SL) 0.125 MG SL tablet, Place 1 tablet (0.125 mg total) under the tongue every 4 (four) hours as needed., Disp: 30 tablet, Rfl: 3   Lancets (ONETOUCH ULTRASOFT) lancets, USE AS DIRECTED 3 TIMES  DAILY, Disp: 300 each, Rfl: 3   losartan (COZAAR) 50 MG tablet, TAKE 1 TABLET BY MOUTH TWICE  DAILY, Disp: 200 tablet, Rfl: 2   metFORMIN (GLUCOPHAGE-XR) 500 MG 24 hr tablet, Take 1 tablet (500 mg total) by mouth daily., Disp: , Rfl:    metoCLOPramide (REGLAN) 5 MG tablet, TAKE 1 TABLET BY MOUTH TWICE  DAILY AS NEEDED, Disp: 180 tablet, Rfl: 1   metoprolol tartrate (LOPRESSOR) 50 MG tablet, TAKE 1 TABLET BY MOUTH TWICE  DAILY, Disp: 200 tablet, Rfl: 3   Multiple Vitamin (MULTIVITAMIN ADULT PO), Take by mouth., Disp: , Rfl:    nystatin (MYCOSTATIN) 100000 UNIT/ML suspension, Take 5 mLs (500,000 Units total) by mouth 4 (four) times daily. Gargle/swish in mouth for 5 minutes and then spit., Disp: 60 mL, Rfl: 0   ondansetron (ZOFRAN) 4 MG  tablet, Take 1 tablet (4 mg total) by mouth every 6 (six) hours as needed., Disp: 30 tablet, Rfl: 5   ONETOUCH ULTRA test strip, CHECK BLOOD SUGAR 3 TIMES DAILY  OR AS NEEDED, Disp: 300 strip, Rfl: 2   pantoprazole (PROTONIX) 40 MG tablet, Take 1 tablet (40 mg total) by mouth daily., Disp: 90 tablet, Rfl: 3   potassium chloride (KLOR-CON M) 10 MEQ tablet, TAKE 1 TABLET BY MOUTH DAILY, Disp: 100 tablet, Rfl: 2   predniSONE (DELTASONE) 20 MG tablet, Take 2 tablets (40 mg total) by mouth daily with breakfast for 5 days., Disp: 10 tablet, Rfl: 0   rosuvastatin (CRESTOR) 20 MG tablet, TAKE 1 TABLET BY MOUTH DAILY, Disp: 100 tablet, Rfl: 1   thiamine (VITAMIN B-1) 100 MG tablet, Take 100 mg by mouth every other day., Disp: , Rfl:    vitamin E 180 MG (400 UNITS) capsule, Take 400 Units by mouth daily., Disp: , Rfl:   Allergies:  Allergies  Allergen Reactions   Tramadol Other (See Comments)    Dizziness     Past Medical History, Surgical history, Social history, and Family History were reviewed and updated.  Review of Systems: Review of Systems  Constitutional: Negative.   HENT:  Negative.    Eyes: Negative.   Respiratory: Negative.    Cardiovascular: Negative.   Gastrointestinal: Negative.   Endocrine: Negative.   Genitourinary: Negative.    Musculoskeletal: Negative.   Skin: Negative.   Neurological: Negative.   Hematological: Negative.   Psychiatric/Behavioral: Negative.      Physical Exam:  weight is 165 lb (74.8 kg). Her oral temperature is 98.4 F (36.9 C). Her blood pressure is 154/61 (abnormal) and her pulse is 66. Her respiration is 18 and oxygen saturation is 100%.   Wt Readings from Last 3 Encounters:  05/23/23 165 lb (74.8 kg)  05/21/23 165 lb 9.6 oz (75.1 kg)  05/15/23 165 lb 3.2 oz (74.9 kg)    Physical Exam Vitals reviewed.  Constitutional:      Comments: Left breast with no masses, edema or erythema.  There is no left axillary adenopathy.  Right breast shows  the lumpectomy incision  at about the 9 o'clock position.   There is no distinct mass.  There is no palpable right axillary adenopathy.  HENT:     Head: Normocephalic and atraumatic.  Eyes:     Pupils: Pupils are equal, round, and reactive to light.  Cardiovascular:     Rate and Rhythm: Normal rate and regular rhythm.     Heart sounds: Normal heart sounds.  Pulmonary:     Effort: Pulmonary effort is normal.  Breath sounds: Normal breath sounds.  Abdominal:     General: Bowel sounds are normal.     Palpations: Abdomen is soft.  Musculoskeletal:        General: No tenderness or deformity. Normal range of motion.     Cervical back: Normal range of motion.  Lymphadenopathy:     Cervical: No cervical adenopathy.  Skin:    General: Skin is warm and dry.     Findings: No erythema or rash.  Neurological:     Mental Status: She is alert and oriented to person, place, and time.  Psychiatric:        Behavior: Behavior normal.        Thought Content: Thought content normal.        Judgment: Judgment normal.     Lab Results  Component Value Date   WBC 10.2 05/23/2023   HGB 12.8 05/23/2023   HCT 36.6 05/23/2023   MCV 82.6 05/23/2023   PLT 230 05/23/2023     Chemistry      Component Value Date/Time   NA 134 (L) 04/16/2023 0938   NA 136 05/08/2016 1305   NA 134 (L) 07/08/2015 1149   K 4.5 04/16/2023 0938   K 4.2 05/08/2016 1305   K 4.4 07/08/2015 1149   CL 96 04/16/2023 0938   CL 102 05/08/2016 1305   CO2 28 04/16/2023 0938   CO2 26 05/08/2016 1305   CO2 25 07/08/2015 1149   BUN 25 (H) 04/16/2023 0938   BUN 16 05/08/2016 1305   BUN 15.5 07/08/2015 1149   CREATININE 0.83 04/16/2023 0938   CREATININE 0.88 03/06/2023 1132   CREATININE 0.91 08/05/2021 1415   CREATININE 0.8 07/08/2015 1149      Component Value Date/Time   CALCIUM 9.9 04/16/2023 0938   CALCIUM 9.4 05/08/2016 1305   CALCIUM 10.0 07/08/2015 1149   ALKPHOS 62 04/16/2023 0938   ALKPHOS 56 05/08/2016 1305    ALKPHOS 62 07/08/2015 1149   AST 18 04/16/2023 0938   AST 19 03/06/2023 1132   AST 24 07/08/2015 1149   ALT 18 04/16/2023 0938   ALT 18 03/06/2023 1132   ALT 29 05/08/2016 1305   ALT 30 07/08/2015 1149   BILITOT 0.6 04/16/2023 0938   BILITOT 0.5 03/06/2023 1132   BILITOT 0.64 07/08/2015 1149      Impression and Plan: Ms. Reader is a very charming 83 year old [place] female.  She now has history of stage I infiltrating ductal carcinoma of the right breast.  She had a low Oncotype score.  She did not require radiotherapy.  We will stop the Arimidex.  We will hold off for about 6 weeks.  If everything is doing better, then we could certainly try her on Aromasin.  If she cannot tolerate Aromasin, then we can always consider tamoxifen.  I am still confident that it is her blood sugars that are can be a bigger problem in the long-term for her.  Hopefully, they will stay under decent control.  I will plan to get her back in about 5-6 weeks.    Ivor Mars, MD 4/16/20251:04 PM

## 2023-05-23 NOTE — Progress Notes (Signed)
 Patient seen in follow up. She is c/o dryness of her mucous membranes that she associates to the Arimidex. Dr Maria Shiner will hold AI for 6 weeks to see if symptoms improve.   Oncology Nurse Navigator Documentation     05/23/2023   12:30 PM  Oncology Nurse Navigator Flowsheets  Navigator Follow Up Date: 06/29/2023  Navigator Follow Up Reason: Follow-up Appointment  Navigator Location [facility]  Navigator Encounter Type Follow-up Appt  Patient Visit Type MedOnc  Treatment Phase Active Tx  Barriers/Navigation Needs No Barriers At This Time  Interventions None Required  Acuity Level 1-No Barriers  Support Groups/Services Friends and Family  Time Spent with Patient 15

## 2023-05-24 ENCOUNTER — Encounter: Payer: Self-pay | Admitting: *Deleted

## 2023-05-24 ENCOUNTER — Other Ambulatory Visit: Payer: Self-pay | Admitting: Internal Medicine

## 2023-05-24 ENCOUNTER — Encounter: Admitting: Physical Therapy

## 2023-05-24 ENCOUNTER — Other Ambulatory Visit: Payer: Self-pay | Admitting: Family Medicine

## 2023-05-28 ENCOUNTER — Ambulatory Visit: Admitting: Physical Therapy

## 2023-05-28 NOTE — Telephone Encounter (Signed)
 Need to make a follow up

## 2023-05-29 ENCOUNTER — Encounter: Payer: Self-pay | Admitting: Physical Therapy

## 2023-05-29 ENCOUNTER — Encounter: Admitting: Physical Therapy

## 2023-05-29 ENCOUNTER — Ambulatory Visit: Admitting: Physical Therapy

## 2023-05-29 ENCOUNTER — Telehealth: Payer: Self-pay

## 2023-05-29 DIAGNOSIS — M6281 Muscle weakness (generalized): Secondary | ICD-10-CM

## 2023-05-29 DIAGNOSIS — R2681 Unsteadiness on feet: Secondary | ICD-10-CM | POA: Diagnosis not present

## 2023-05-29 DIAGNOSIS — R2689 Other abnormalities of gait and mobility: Secondary | ICD-10-CM

## 2023-05-29 DIAGNOSIS — M25552 Pain in left hip: Secondary | ICD-10-CM | POA: Diagnosis not present

## 2023-05-29 NOTE — Telephone Encounter (Signed)
 Patient showed up in office, no scheduled appt. Stating her arm is swollen,red and bruising. Patient was advised by our registration team that she would need to go to ED for evaluation. Patient walked away.

## 2023-05-29 NOTE — Therapy (Addendum)
 OUTPATIENT PHYSICAL THERAPY TREATMENT / RECERTIFICATION  Progress Note  Reporting Period 04/24/2023 to 05/29/2023   See note below for Objective Data and Assessment of Progress/Goals.      Patient Name: [name] MRN: [id number] DOB:Jun 14, 1940, 83 y.o., female Today's Date: 05/29/2023   END OF SESSION:  PT End of Session - 05/29/23 1414     Visit Number 10    Date for PT Re-Evaluation 06/26/23    Authorization Type [employer] Medicare    Authorization Time Period 05/08/23 - 06/05/23    Authorization - Visit Number 4    Authorization - Number of Visits 8    Progress Note Due on Visit 10    PT Start Time 1414   Pt arrived late   PT Stop Time 1514    PT Time Calculation (min) 60 min    Activity Tolerance Patient tolerated treatment well;Patient limited by fatigue    Behavior During Therapy [facility] for tasks assessed/performed                  Past Medical History:  Diagnosis Date   Abdominal pain in female 03/18/2010   Qualifier: Diagnosis of  By: Willy Harvest MD, Kelleen Patee E    Anemia 06/08/2014   Anxiety    Arthritis    Spinal Osteoarthritis   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 09/05/2006   Qualifier: Diagnosis of  By: Georganne Kind RMA, Lucy     Diabetic peripheral neuropathy (HCC) 10/29/2013   Encounter for preventative adult health care exam with abnormal findings 09/14/2013   Esophageal reflux    Gastric polyp    Fundic Gland   Gastroparesis    Headache(784.0)    Heart murmur    Echocardiogram 2/11: EF 60-65%, mild LAE, grade 1 diastolic dysfunction, aortic valve sclerosis, mean gradient 9 mm of mercury, PASP 34   Hematuria 03/16/2016   Iron  deficiency anemia, unspecified    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   Leg swelling    bilateral   Neck pain 04/22/2015   PONV  (postoperative nausea and vomiting)    pt states body temperature drops every time she has anesthesia; pt states only needs small amount of anesthesia   PSVT (paroxysmal supraventricular tachycardia) (HCC)    Pure hypercholesterolemia    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   Renal insufficiency 06/18/2019   pt states  L kidney function very low-functions at about 20%   Stroke [facility])    tia, 2014   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   Type II or unspecified type diabetes mellitus without mention of complication, not stated as uncontrolled    Unspecified essential hypertension    Unspecified hereditary and idiopathic peripheral neuropathy 10/29/2013   Vitamin D  deficiency    Past Surgical History:  Procedure Laterality Date   BREAST BIOPSY Right 06/13/2022   US  RT BREAST BX W LOC DEV 1ST LESION IMG BX SPEC US  GUIDE 06/13/2022 [facility] MAMMOGRAPHY   BREAST LUMPECTOMY Right 07/31/2022   Procedure: RIGHT BREAST LUMPECTOMY;  Surgeon: Oza Blumenthal, MD;  Location: [facility];  Service: General;  Laterality: Right;   CHOLECYSTECTOMY  1993   COLONOSCOPY  11/11/2010   diverticulosis   DILATATION & CURRETTAGE/HYSTEROSCOPY WITH RESECTOCOPE N/A 02/25/2013   Procedure: Attempted hysteroscopy with uterine  perforation;  Surgeon: Iva Mariner de Epifania Haskell, MD;  Location: [facility];  Service: Gynecology;  Laterality: N/A;   ESOPHAGOGASTRODUODENOSCOPY  08/29/2010; 09/15/2010   Carcinoid tumor less than 1 cm in July 2012 not seen in August 2012 , gastritis, fundic gland polyps   ESOPHAGOGASTRODUODENOSCOPY  05/16/2011   ESOPHAGOGASTRODUODENOSCOPY  06/14/2012   EUS  12/15/2010   Procedure: UPPER ENDOSCOPIC ULTRASOUND (EUS) LINEAR;  Surgeon: Hoyt Macleod, MD;  Location: [facility];  Service: Endoscopy;  Laterality: N/A;   EYE SURGERY Bilateral    Bi lateral cateracts and bi lateral laser   LAPAROSCOPY N/A 02/25/2013   Procedure: Cystoscopy and laparoscopy with fulguration of uterine serosa;  Surgeon: Iva Mariner de Epifania Haskell, MD;  Location: [facility];  Service: Gynecology;  Laterality: N/A;   TONSILLECTOMY     Patient Active Problem List   Diagnosis Date Noted   Precordial chest pain 12/24/2022   Carcinoma of breast upper outer quadrant, right (HCC) 07/04/2022   SVT (supraventricular tachycardia) (HCC) 06/21/2022   Left-sided weakness 04/02/2022   Unsteady gait 04/02/2022   Oral lesion 03/08/2021   Sun-damaged skin 03/08/2021   Thiamine  deficiency 11/01/2020   Trigeminal neuralgia 08/21/2020   Pedal edema 08/21/2020   Chronic left-sided back pain 03/28/2020   Nocturia 03/28/2020   Left-sided headache 03/28/2020   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   Renal insufficiency 06/18/2019   Educated about COVID-19 virus infection 05/15/2019   Atrophic vaginitis 01/20/2019   Stenosis of carotid artery 11/12/2018   Anxiety 10/13/2018   Referred otalgia of left ear 04/23/2018   Chronic throat clearing 04/23/2018   Sinusitis 10/11/2017   Headache 07/12/2017   Dizzy spells 11/21/2016   Constipation 11/21/2016   Nonrheumatic aortic valve stenosis 05/23/2016   Aortic atherosclerosis (HCC) 05/23/2016   Hematuria 03/16/2016   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   Pain of upper abdomen 06/06/2015   Neck pain 04/22/2015   Ear pain 02/28/2015   Abnormal urine 11/21/2014   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   Anemia 06/08/2014   RLS (restless legs syndrome) 11/23/2013   Diabetic peripheral neuropathy (HCC) 10/29/2013   Left-sided thoracic back pain 10/06/2013   Encounter for preventative adult health care exam with abnormal findings 09/14/2013   Iron  deficiency anemia    Status post laparoscopy 02/25/2013   Hyponatremia 01/09/2013   GERD (gastroesophageal reflux disease) 01/09/2013   Amaurosis fugax of left eye 10/16/2012   Low back pain 06/03/2012   Vitamin D  deficiency 03/11/2012   Bilateral hand pain 10/20/2011   Encounter for long-term (current) use of other  medications 10/20/2011   IBS (irritable bowel syndrome) 08/14/2011   TIA (transient ischemic attack) 02/10/2011   Abnormal brain CT 01/19/2011   Allergic rhinitis 10/01/2010   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   Preventative health care 07/15/2010   FUNDIC GLAND POLYPS OF STOMACH 03/18/2010   Abdominal pain in female 03/18/2010   Left hip pain 03/17/2009   SYSTOLIC MURMUR 03/02/2009   Paroxysmal supraventricular tachycardia (HCC) 01/12/2009   PLANTAR FASCIITIS 06/08/2008   CHEST PAIN 05/18/2008   Gastroparesis 12/18/2007   HYPERCHOLESTEROLEMIA 06/11/2007   Type 2 diabetes mellitus with obesity (HCC) 09/05/2006   Essential hypertension 09/05/2006    PCP: Neda Balk, MD   REFERRING PROVIDER: Neda Balk, MD   REFERRING DIAG:  M79.10 (ICD-10-CM) - Myalgia  R26.81 (ICD-10-CM) - Unsteady gait  R53.1 (ICD-10-CM) - Weakness   THERAPY DIAG:  Muscle weakness (generalized) - Plan:  PT plan of care cert/re-cert  Other abnormalities of gait and mobility - Plan: PT plan of care cert/re-cert  Unsteadiness on feet - Plan: PT plan of care cert/re-cert  Pain in left hip - Plan: PT plan of care cert/re-cert  RATIONALE FOR EVALUATION AND TREATMENT: Rehabilitation  ONSET DATE: Worsening over the past year, especially since breast cancer diagnosis and May 2024  NEXT MD VISIT: 07/24/2023   SUBJECTIVE:                                                                                                                                                                                                         SUBJECTIVE STATEMENT: Skila continues to complain of increased tired/fatigue today - cancelled yesterday's PT appointment.  She states the MD put her on antibiotics for her ears.   EVAL: Pt reports her weakness and pains seems to be exacerbated by recent meds she has had to take. She notes difficulty climbing stairs and does not feel safe using a step stool to reach  high things.  She feels like she has no energy.  She reports chronic belly pain, maybe from meds vs diverticulitis.  L hip pain has been an issue for several months but patient has been told there is no "bony" reason for her pain (x-rays negative).  PAIN: Are you having pain? No and Yes: NPRS scale: 5/10  Pain location: L lateral hip Pain description: dull, sharp when more intense  Aggravating factors: lying on side to sleep (worse when lying on L than R), first moving in the morning Relieving factors: walking, Tylenol    Are you having pain? Yes: NPRS scale: 6/10 Pain location: L>R upper shoulders Pain description: pinching Aggravating factors: uncertain Relieving factors: keep moving  PERTINENT HISTORY:  Extensive PMH including chronic back and neck pain, OA, osteoporosis, DM, HTN, h/o TIA, diabetic peripheral neuropathy, PVD, headaches, sleep dysfunction, anxiety, R breast cancer s/p lumpectomy 07/31/22, SVT (refer to above problem list and past medical/surgical history for full PMH)   PRECAUTIONS: Fall  RED FLAGS: None  WEIGHT BEARING RESTRICTIONS: No  FALLS:  Has patient fallen in last 6 months? No - admits to a few close calls due to dizziness/lightheadness  LIVING ENVIRONMENT: Lives with: lives alone  Lives in: House/apartment Stairs: No Has following equipment at home: Single point cane, Environmental consultant - 4 wheeled, shower chair, and Grab bars  OCCUPATION: Retired  PLOF: Independent, Needs assistance with homemaking, Leisure: read and play card games on her phone, traveling, and housekeeping/cleaning service ~1x/month   PATIENT GOALS: "To be able to  walk steady and feel comfortable going up stairs so I can visit my niece and nephew. To be able to carry groceries."   OBJECTIVE: (objective measures completed at initial evaluation unless otherwise dated)  DIAGNOSTIC FINDINGS:  01/22/23 & 10/05/22 - DH Left hip (2 several months of L hip pain) IMPRESSION:  Negative.  COGNITION: Overall cognitive status: Within functional limits for tasks assessed   SENSATION: Numbness and tingling in hands and feet from DM and cervical radiculopathy  POSTURE:  rounded shoulders, forward head, increased thoracic kyphosis, and flexed trunk   PALPATION: Increased TTP over L TFL, hip flexors and abductors  UPPER EXTREMITY MMT:  MMT Right eval Left eval  Shoulder flexion 4- 4-  Shoulder extension 3+ 3+  Shoulder abduction 4- 4-  Shoulder adduction    Shoulder internal rotation 4- 4-  Shoulder external rotation 3+ 3+  Middle trapezius    Lower trapezius     (Blank rows = not tested)  MUSCLE LENGTH: Hamstrings: mod tight B ITB: mod tight B  Piriformis: severe tight B Hip flexors: mild/mod tight L>R  Quads: mild/mod tight L>R  Heelcord: mod tight B  LOWER EXTREMITY ROM:    Mildly limited primarily due to muscle tightness  LOWER EXTREMITY MMT:  (tested in sitting on eval)  MMT Right eval Left eval R 05/29/23 L 05/29/23  Hip flexion 3+ 3+ 4- 4-  Hip extension 3- 3- 4- 4-  Hip abduction 3+ 3+ 4 4  Hip adduction 3+ 3+ 4 4  Hip internal rotation 3- 3- 4 4-  Hip external rotation 3- 3- 4 4  Knee flexion 4- 4- 4+ 4  Knee extension 4- 4 4+ 4+  Ankle dorsiflexion 3+ 4- 4 4  Ankle plantarflexion      Ankle inversion      Ankle eversion      (Blank rows = not tested)  BED MOBILITY:  Sit to supine Min A Supine to sit Min A Rolling to Right SBA  TRANSFERS: Assistive device utilized: None  Sit to stand: Complete Independence Stand to sit: Complete Independence Chair to chair: Modified independence Floor: NT  GAIT: Distance walked: Clinic distances Assistive device utilized: None Level of assistance: Complete Independence and SBA Gait pattern: decreased stride length and trunk flexed Comments: Decreased gait speed  RAMP: Level of Assistance: NT Assistive device utilized:   Ramp Comments:   CURB:  Level of Assistance: NT Assistive  device utilized:   Curb Comments:   STAIRS: Level of Assistance: SBA Stair Negotiation Technique: Step to Pattern, Sideways (descent), Forwards (ascent) with Single Rail on Right Number of Stairs: 14  Height of Stairs: 7"  Comments: forward for ascent, sideways leading with R foot for descent    FUNCTIONAL TESTS: (TBA next visit) 5 times sit to stand: 30.43 sec; >15 sec indicates a risk for recurrent falls  Timed up and go (TUG): 16.03 sec; >13.5 sec indicates a high risk for falls  10 meter walk test: 13.91 sec ; Gait speed = 2.36 ft/sec; >/=2.62 ft/sec is necessary to be considered a Materials engineer Scale: 44/56; Scores of 37-45 indicate significant risk for falls (>80%)  Dynamic Gait Index: 15/24; Scores of 19 or less are predictive of falls in older community living adults.    05/03/23   TUG: 13.5 sec  05/15/23 = 11.78 sec Gait speed = 2.78 ft/sec  05/29/23 5xSTS = 25.78 sec; ; >15 sec indicates a risk for recurrent falls Berg = 50/56; Scores  of 46-51 indicate moderate (>50%) risk for falls DGI = 18/24; Scores of 19 or less are predicitve of falls in older community living adults.  PATIENT SURVEYS:  ABC scale 580 / 1600 = 36.3 %, scores of <50% indicating a low level of physical functioning and scores of <67% indicating a risk for falls in patients with vestibular disorders 05/29/23: 770 / 1600 = 48.1 %   TODAY'S TREATMENT:   05/29/23 - Recert THERAPEUTIC EXERCISE: To improve strength and endurance.  Demonstration, verbal and tactile cues throughout for technique. NuStep - L5 x 6 min  VS: BP = 130/62 HR = 66 SpO2 = 98%  THERAPEUTIC ACTIVITIES: To improve functional performance.  Demonstration, verbal and tactile cues throughout for technique. LE MMT Goal assessment  PHYSICAL PERFORMANCE TEST or MEASUREMENT: 5xSTS = 25.78 sec; ; >15 sec indicates a risk for recurrent falls Berg = 50/56; Scores of 46-51 indicate moderate (>50%) risk for  falls DGI = 18/24; Scores of 19 or less are predicitve of falls in older community living adults.   Berg Balance Test   Sit to Stand Able to stand without using hands and stabilize independently    Standing Unsupported Able to stand safely 2 minutes    Sitting with Back Unsupported but Feet Supported on Floor or Stool Able to sit safely and securely 2 minutes    Stand to Sit Sits safely with minimal use of hands    Transfers Able to transfer safely, minor use of hands    Standing Unsupported with Eyes Closed Able to stand 10 seconds safely    Standing Unsupported with Feet Together Able to place feet together independently and stand 1 minute safely    From Standing, Reach Forward with Outstretched Arm Can reach confidently >25 cm (10")    From Standing Position, Pick up Object from Floor Able to pick up shoe safely and easily    From Standing Position, Turn to Look Behind Over each Shoulder Looks behind one side only/other side shows less weight shift    Turn 360 Degrees Able to turn 360 degrees safely one side only in 4 seconds or less    Standing Unsupported, Alternately Place Feet on Step/Stool Able to stand independently and complete 8 steps >20 seconds    Standing Unsupported, One Foot in Front Able to plae foot ahead of the other independently and hold 30 seconds    Standing on One Leg Able to lift leg independently and hold equal to or more than 3 seconds    Total Score 50    Berg comment: 46-51 moderate (>50%)      Dynamic Gait Index   Level Surface Normal    Change in Gait Speed Moderate Impairment    Gait with Horizontal Head Turns Normal    Gait with Vertical Head Turns Normal    Gait and Pivot Turn Mild Impairment    Step Over Obstacle Mild Impairment    Step Around Obstacles Normal    Steps Moderate Impairment    Total Score 18    DGI comment: Scores of 19 or less are predicitve of falls in older community living adults.       05/17/23 THERAPEUTIC EXERCISE: To improve  strength and endurance.  Demonstration, verbal and tactile cues throughout for technique.  NuStep - L4 x 6 min (UE/LE)  MANUAL THERAPY: To promote normalized muscle tension and reduced pain utilizing connective tissue massage, therapeutic massage, and manual TP therapy. STM/DTM to L UT, LS and periscapular muscles  NEUROMUSCULAR RE-EDUCATION: To improve balance, coordination, kinesthesia, posture, and proprioception. Seated on dynadisc on mat table with feet on Airex pad atop 4" step YTB scap retraction + B shoulder rows x 10 YTB scap retraction + B shoulder extension x 10 YTB B pallof press x 10 Hip flexion march x 10 - pt c/o R hip/groin pain at end of set Attempted LAQ but discontinued d/t R hip pain Seated alt LAQ x 10 - pt reports better tolerance  THERAPEUTIC ACTIVITIES: To improve functional performance.  Demonstration, verbal and tactile cues throughout for technique. Fwd step-up to 6" step x 10 bil, single UE support on back of chair to simulate handrail B lateral step-up to 6" step with B UE support on window sill x 5   05/15/23 THERAPEUTIC EXERCISE: To improve strength and endurance.  Demonstration, verbal and tactile cues throughout for technique.  NuStep - L4 x 6 min (UE/LE) BATCA seated row 5# 2 x 10 BATCA seated knee flexion 15# x 10  NEUROMUSCULAR RE-EDUCATION: To improve balance, coordination, kinesthesia, proprioception, and reduce fall risk. Alt toe clears to 9" stool x 10 Fwd & back alt single step-over 1/2 foam roll x 10 Lateral double LE step-over 1/2 foam roll x 10  THERAPEUTIC ACTIVITIES: To improve functional performance.  Demonstration, verbal and tactile cues throughout for technique. Fwd step-up to 6" step x 5 bil, single UE support on back of chair to simulate handrail = 11.78 sec Gait speed = 2.78 ft/sec   PATIENT EDUCATION:  Education details: progress with PT, continue with current HEP, and work toward completing [place] 3x/wk  Person educated:  Patient Education method: Explanation Education comprehension: verbalized understanding  HOME EXERCISE PROGRAM: Access Code: [email or web address] URL: [email or web address] Date: 04/24/2023 Prepared by: Felecia Hopper  Patient Education - Check for Safety - [facility] Program   ASSESSMENT:  CLINICAL IMPRESSION: Larina continues to report increased fatigue/tired over the past few weeks and reports she is currently on antibiotics.  She does note increased ease of transfers/transitional mobility since starting PT.  Strength reassessed today with gain noted across all LE muscle groups but continued proximal LE weakness evident.  Balance confidence per ABC scale improved from 36.3% on eval to 48.1% today, however scores of <50% indicating a low level of physical functioning and scores of <67% indicating a risk for falls in patients with vestibular disorders.  Berg improved by 6 points from 44/56 to 50/56 indicating a reduction in fall risk from significant (>80%) to moderate (>50%).  3 point gain also observed on DGI from 15/24 to 18/24, however scores of 19 or less are predicitve of falls in older community living adults.   Altovise is demonstrating progress toward all of her PT goals and will benefit from continued skilled PT to address ongoing strength and balance deficits to improve mobility and activity tolerance with decreased pain interference and decreased risk for falls, therefore will recommend recert for additional 2x/wk for up to 4-6 weeks.   OBJECTIVE IMPAIRMENTS: Abnormal gait, decreased activity tolerance, decreased balance, decreased coordination, decreased endurance, decreased knowledge of condition, decreased knowledge of use of DME, decreased mobility, difficulty walking, decreased ROM, decreased strength, decreased safety awareness, dizziness, increased fascial restrictions, impaired perceived functional ability, increased muscle spasms, impaired flexibility, impaired sensation,  impaired UE functional use, improper body mechanics, postural dysfunction, and pain.   ACTIVITY LIMITATIONS: carrying, lifting, bending, standing, squatting, sleeping, stairs, transfers, bed mobility, and locomotion level  PARTICIPATION LIMITATIONS: meal prep, cleaning, laundry, driving, shopping, community activity, and church  PERSONAL FACTORS: Age, Fitness, Past/current experiences, Social background, Time since onset of injury/illness/exacerbation, and 3+ comorbidities: Extensive PMH including chronic back and neck pain, OA, osteoporosis, DM, HTN, h/o TIA, diabetic peripheral neuropathy, PVD, headaches, sleep dysfunction, anxiety, R breast cancer s/p lumpectomy 07/31/22, SVT (refer to above problem list and past medical/surgical history for full PMH)  are also affecting patient's functional outcome.   REHAB POTENTIAL: Good  CLINICAL DECISION MAKING: Unstable/unpredictable  EVALUATION COMPLEXITY: High   GOALS: Goals reviewed with patient? Yes  SHORT TERM GOALS: Target date: 05/01/2023  Patient will be independent with initial HEP to improve outcomes and carryover.  Baseline:  Goal status: MET - 05/01/23 - pt denies any questions or concerns with [place] fall prevention program  2.  Patient will be educated on strategies to decrease risk of falls.  Baseline:  Goal status: MET - 04/24/23  3.  Patient will demonstrate decreased TUG time to </= 13.5 sec to decrease risk for falls with transitional mobility. Baseline:  Goal status: MET - 13.5 sec - 05/03/23  LONG TERM GOALS: Target date: 05/29/2023, extended to 07/10/2023  Patient will be independent with advanced/ongoing HEP to facilitate ability to maintain/progress functional gains from skilled physical therapy services. Baseline:  Goal status: IN PROGRESS - 05/29/23 - pt reports completing the [facility] Prevention program ~2x/wk currently  2.  Patient will be able to ambulate 600' with or w/o LRAD on variable surfaces with good safety  to access community.  Baseline:  Goal status: IN PROGRESS - 05/29/23 - no issues on indoors surfaces, deferred outdoor assessment due to weather  3.  Patient will be able to step up/down curb safely with or w/o LRAD for safety with community ambulation.  Baseline:  Goal status: IN PROGRESS - 05/29/23 - not assessed  4.  Patient will demonstrate improved B LE strength to >/= 4/5 for improved stability and ease of mobility. Baseline: Refer to above LE MMT table Goal status: PARTIALLY MET - 05/29/23 - gains noted in all muscle groups, but B hip flexion, extension and L hip IR still 4-/5  5.  Patient will improve 5x STS time to </= 15 seconds for improved efficiency and safety with transfers. Baseline: 30.43 sec (04/09/23) Goal status: IN PROGRESS - 05/29/23 - 25.78 sec  6.  Patient will demonstrate gait speed of >/= 2.62 ft/sec (0.8 m/s) to be a safe community ambulator with decreased risk for recurrent falls.  Baseline: 2.36 ft/sec (04/09/23) Goal status: MET - 05/15/23 - 2.78 ft/sec  7.  Patient will improve Berg score by at least 8 points to improve safety and stability with ADLs in standing and reduce risk for falls.  Baseline: 44/56 (04/09/23) Goal status: IN PROGRESS - 05/29/23 - 50/56  8.  Patient will demonstrate at least 19/24 on DGI or 19/30 on FGA to improve gait stability and reduce risk for falls. Baseline: DGI = 15/24 (04/09/23) Goal status: IN PROGRESS - 05/29/23 - DGI = 18/24  9. Patient will report >/= 50% on ABC scale to demonstrate improved balance confidence with functional mobility and gait. Baseline: 580 / 1600 = 36.3 % Goal status: IN PROGRESS - 05/29/23 - 770 / 1600 = 48.1 %   PLAN:  PT FREQUENCY: 2x/week  PT DURATION: 8 weeks  PLANNED INTERVENTIONS: [id number]- PT Re-evaluation, 97110-Therapeutic exercises, 97530- Therapeutic activity, 97112- Neuromuscular re-education, 97535- Self Care, [id number]- Manual therapy, [id number]- Gait training, [phone]- Canalith repositioning, [id number]-  Electrical stimulation (unattended), 97035- Ultrasound, 21308- Ionotophoresis 4mg /ml Dexamethasone , Patient/Family education, Balance training,  Stair training, Taping, Dry Needling, Joint mobilization, Spinal mobilization, Vestibular training, Cryotherapy, Moist heat, and 16109- Physical performance test or measurement  PLAN FOR NEXT SESSION: Review [place] fall prevention program PRN; LE strengthening - functional emphasis for stair negotiation; balance training   Francisco Irving, PT 05/29/2023, 3:41 PM    Date of referral: 01/16/2023 Referring provider: Neda Balk, MD Referring diagnosis?  M79.10 (ICD-10-CM) - Myalgia  R26.81 (ICD-10-CM) - Unsteady gait  R53.1 (ICD-10-CM) - Weakness  Treatment diagnosis? (if different than referring diagnosis)  No diagnosis found.  What was this (referring dx) caused by? Ongoing Issue and Other: Sequelae of treatments for breast cancer  Nature of Condition: Chronic (continuous duration > 3 months)   Laterality: Lt  Current Functional Measure Score: Other ABC scale = 770 / 1600 = 48.1 % (05/29/23);  580 / 1600 = 36.3 % as of eval on 04/24/23  Objective measurements identify impairments when they are compared to normal values, the uninvolved extremity, and prior level of function.  [x]  Yes  []  No  Objective assessment of functional ability: Severe functional limitations   Briefly describe symptoms: Patient presents with physical impairments of L anterior lateral hip pain, impaired activity tolerance, global UE/LE weakness, dizziness, impaired standing balance, impaired ambulation, inability to navigate stairs and decreased safety awareness impacting safe and independent functional mobility.  Gains noted in LE strength and across all standardized balance tests since start of PT but remain shy of PT goals.  How did symptoms start: Gradual onset exacerbated by treatment for breast cancer starting May 2024  Average pain intensity:  Last 24 hours:  7/10  Past week: 8/10  How often does the pt experience symptoms? Frequently  How much have the symptoms interfered with usual daily activities? Quite a bit  How has condition changed since care began at this facility? Better  In general, how is the patients overall health? Fair  Onset date: Chronic   BACK PAIN (STarT Back Screening Tool) - (When applicable): N/A  Has your back pain spread down your leg(s) at sometime in the last 2 weeks? []  Yes   []  No Have you had pain in the shoulder or neck at sometime in the past 2 weeks? []  Yes   []  No Have you only walked short distances because of your back pain? []  Yes   []  No In the past 2 weeks, have you dressed more slowly than usual because of your back pain? []  Yes   []  No Do you think it is not really safe for person with a condition like yours to be physically active? []  Yes   []  No Have worrying thoughts been going through your mind a lot of the time? []  Yes   []  No Do you feel that your back pain is terrible and it is never going to get any better? []  Yes   []  No In general, have you stopped enjoying all the things you usually enjoy? []  Yes   []  No Overall, how bothersome has your back pain been in the last 2 weeks? []  Not at all   []  Slightly     []  Moderate   []  Very much     []  Extremely

## 2023-05-31 ENCOUNTER — Ambulatory Visit: Admitting: Physical Therapy

## 2023-05-31 DIAGNOSIS — H04123 Dry eye syndrome of bilateral lacrimal glands: Secondary | ICD-10-CM | POA: Diagnosis not present

## 2023-05-31 DIAGNOSIS — E119 Type 2 diabetes mellitus without complications: Secondary | ICD-10-CM | POA: Diagnosis not present

## 2023-05-31 DIAGNOSIS — Z961 Presence of intraocular lens: Secondary | ICD-10-CM | POA: Diagnosis not present

## 2023-05-31 LAB — HM DIABETES EYE EXAM

## 2023-06-01 ENCOUNTER — Ambulatory Visit: Payer: Self-pay

## 2023-06-01 NOTE — Telephone Encounter (Addendum)
 Chief Complaint: sore throat Symptoms: sore throat, throat tightness, swelling, pain the neck Frequency: since 4/8 Pertinent Negatives: Patient denies fever, difficulty breathing, N/V Disposition: [x] ED /[] Urgent Care (no appt availability in office) / [] Appointment(In office/virtual)/ []  [facility]/ [] Home Care/ [x] Refused Recommended Disposition /[] [facility]/ []  Follow-up with PCP Additional Notes: Pt endorses 7/10 sore throat with the sensation of throat tightness and swelling. Pt endorses throat dryness as well. On 4/14 pt was treated  for strep with amoxicillin  and finished it yesterday. Pt states she feels better now after the antibiotic but is still having symptoms.  Pt states she is unable to swallow hard foods and nearly choked on a pill yesterday because she could not swallow it. Pt endorses she can swallow her saliva but "its dry." Pt denies difficulty breathing or fever but endorses chills. Pt states her neck hurts when she touches it. Pt states she feels there is something wrong with her throat and feels like something is stuck inside of it. RN advised pt she needs to go to the ED. Pt declined. RN repeatedly educated the pt about why the ED is the best place to address her symptoms, RN still declined. RN called the CAL. CAL advised RN to reiterate to pt she has to go to the ED. RN again advised the pt she has to go to the ED, pt continued to decline.   Then, pt mentioned that at a 4/16 office visit w/ oncology she was taken off a medication that her oncologist felt may be contributing to her symptoms:  "Ms. Leger is back for follow-up.  Unfortunately, is that she may be having a problem with the Arimidex .  She says that after couple weeks of taking it, she had a lot of dryness.  She had her throat was very sore and felt like it was closing up on her."  On 4/16 pt was instructed to take this medication yet she is still having symptoms. Pt states she Arimidex  is  to blame for her symptoms. RN advised pt that although that may be the case, if she is having difficulty swallowing and has throat tightness or swelling, she has to go to the ED. Pt continued to decline. Pt asked for an office visit next week for her throat to be looked at again. Pt asked for a referral to ENT. Pt would appreciate follow-up from the office and would like to be seen in the office next week.  Please follow-up w/ pt.   Copied from CRM [id number]. Topic: Clinical - Red Word Triage >> Jun 01, 2023  3:30 PM Taleah C wrote: Red Word that prompted transfer to Nurse Triage: pain In neck, swelling in throat Reason for Disposition  Patient sounds very sick or weak to the triager  Answer Assessment - Initial Assessment Questions 1. ONSET: "When did the throat start hurting?" (Hours or days ago)      All start with pain in ears and mouth. Pt was seen 4/8 and 4/14. Pt was seen by her twice (per pt.) Pt was given Nystatin  and then was diagnosed with strep throat and an ear infection. Pt was prescribed antibiotics. Symptoms since 4/8 or sooner. Antibiotics finished. She states antibiotics were finished yesterday, but she still has symptoms. 2. SEVERITY: "How bad is the sore throat?" (Scale 1-10; mild, moderate or severe)   - MILD (1-3):  Doesn't interfere with eating or normal activities.   - MODERATE (4-7): Interferes with eating some solids and normal activities.   -  SEVERE (8-10):  Excruciating pain, interferes with most normal activities.   - SEVERE WITH DYSPHAGIA (10): Can't swallow liquids, drooling.     Pain with swallowing certain foods - 7/10  3. STREP EXPOSURE: "Has there been any exposure to strep within the past week?" If Yes, ask: "What type of contact occurred?"      Dx with strep 4/14 4.  VIRAL SYMPTOMS: "Are there any symptoms of a cold, such as a runny nose, cough, hoarse voice or red eyes?"      No  5. FEVER: "Do you have a fever?" If Yes, ask: "What is your temperature, how  was it measured, and when did it start?"     Endorses chills ("I'm always cold"), no fever  6. PUS ON THE TONSILS: "Is there pus on the tonsils in the back of your throat?"     Does not have tonsils - pt states "I see something different on the L side, I think it's swollen glands, sometimes white spots, red" 7. OTHER SYMPTOMS: "Do you have any other symptoms?" (e.g., difficulty breathing, headache, rash)     "I feel like I am choking", "hard food I cannot swallow." Pt states she can swallow her saliva but it's "very dry." "I feel like there is something there in my neck." Pt states she could not swallow her pill yesterday. Pt states it got stuck and she was "about to choke." No difficulty breathing. Endorses sensation of throat tightness. Pt states when she touches her neck on the outside it hurts.  Protocols used: Sore Throat-A-AH

## 2023-06-05 ENCOUNTER — Other Ambulatory Visit (HOSPITAL_BASED_OUTPATIENT_CLINIC_OR_DEPARTMENT_OTHER): Payer: Self-pay

## 2023-06-05 ENCOUNTER — Ambulatory Visit: Admitting: Physical Therapy

## 2023-06-05 ENCOUNTER — Encounter: Payer: Self-pay | Admitting: Physical Therapy

## 2023-06-05 ENCOUNTER — Ambulatory Visit (INDEPENDENT_AMBULATORY_CARE_PROVIDER_SITE_OTHER): Admitting: Medical

## 2023-06-05 ENCOUNTER — Encounter: Payer: Self-pay | Admitting: Medical

## 2023-06-05 VITALS — BP 130/66 | HR 60 | Temp 98.3°F | Resp 18 | Ht 61.0 in | Wt 164.0 lb

## 2023-06-05 DIAGNOSIS — R2689 Other abnormalities of gait and mobility: Secondary | ICD-10-CM

## 2023-06-05 DIAGNOSIS — J029 Acute pharyngitis, unspecified: Secondary | ICD-10-CM

## 2023-06-05 DIAGNOSIS — R591 Generalized enlarged lymph nodes: Secondary | ICD-10-CM

## 2023-06-05 DIAGNOSIS — M25552 Pain in left hip: Secondary | ICD-10-CM | POA: Diagnosis not present

## 2023-06-05 DIAGNOSIS — M6281 Muscle weakness (generalized): Secondary | ICD-10-CM

## 2023-06-05 DIAGNOSIS — J020 Streptococcal pharyngitis: Secondary | ICD-10-CM

## 2023-06-05 DIAGNOSIS — R5383 Other fatigue: Secondary | ICD-10-CM | POA: Diagnosis not present

## 2023-06-05 DIAGNOSIS — R2681 Unsteadiness on feet: Secondary | ICD-10-CM | POA: Diagnosis not present

## 2023-06-05 LAB — CBC WITH DIFFERENTIAL/PLATELET
Basophils Absolute: 0.1 10*3/uL (ref 0.0–0.1)
Basophils Relative: 0.9 % (ref 0.0–3.0)
Eosinophils Absolute: 0.1 10*3/uL (ref 0.0–0.7)
Eosinophils Relative: 0.9 % (ref 0.0–5.0)
HCT: 37.4 % (ref 36.0–46.0)
Hemoglobin: 12.5 g/dL (ref 12.0–15.0)
Lymphocytes Relative: 27.6 % (ref 12.0–46.0)
Lymphs Abs: 2.3 10*3/uL (ref 0.7–4.0)
MCHC: 33.4 g/dL (ref 30.0–36.0)
MCV: 85 fl (ref 78.0–100.0)
Monocytes Absolute: 0.9 10*3/uL (ref 0.1–1.0)
Monocytes Relative: 10.6 % (ref 3.0–12.0)
Neutro Abs: 4.9 10*3/uL (ref 1.4–7.7)
Neutrophils Relative %: 60 % (ref 43.0–77.0)
Platelets: 216 10*3/uL (ref 150.0–400.0)
RBC: 4.4 Mil/uL (ref 3.87–5.11)
RDW: 13.2 % (ref 11.5–15.5)
WBC: 8.2 10*3/uL (ref 4.0–10.5)

## 2023-06-05 LAB — POCT INFLUENZA A/B
Influenza A, POC: NEGATIVE
Influenza B, POC: NEGATIVE

## 2023-06-05 LAB — POC COVID19 BINAXNOW: SARS Coronavirus 2 Ag: NEGATIVE

## 2023-06-05 LAB — POCT RAPID STREP A (OFFICE): Rapid Strep A Screen: NEGATIVE

## 2023-06-05 MED ORDER — AZITHROMYCIN 250 MG PO TABS
ORAL_TABLET | ORAL | 0 refills | Status: AC
  Filled 2023-06-05: qty 6, 5d supply, fill #0

## 2023-06-05 NOTE — Progress Notes (Signed)
 Subjective:    [name], female    DOB: [birth year], 83 y.o.   MRN: [id number]  HPI  Pt seen by Heidi Llamas for strep throat  2 weeks ago. Also had strep 6 months ago.  Pt states took antibiotic but she did not get better. April 14 treated with amxocilling. Despite this antibiotic she has submandibular discomfort. Also dx with left eustahian tube dysfunction. Pt given prednisone  20 mg. 2 tab daily for 5 day treatment. Her left ear feels a lot better.   Pt got new tooth brush after being on antibiotic for 5 days.   Pt has no sneezing, itcy eyes or runny nose.     Review of Systems  Constitutional:  Positive for fatigue. Negative for chills and fever.  HENT:  Negative for congestion, nosebleeds, postnasal drip, sore throat and trouble swallowing.        Palate pain  Respiratory:  Negative for cough, chest tightness and wheezing.   Cardiovascular:  Negative for chest pain.  Neurological:  Negative for dizziness, syncope, weakness and light-headedness.  Hematological:  Positive for adenopathy.  Psychiatric/Behavioral:  Negative for behavioral problems, confusion and dysphoric mood. The patient is not hyperactive.     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 09/05/2006   Qualifier: Diagnosis of  By: Georganne Kind RMA, Lucy     Diabetic peripheral neuropathy (HCC) 10/29/2013   Encounter for preventative adult health care exam with abnormal findings 09/14/2013   Esophageal reflux    Gastric polyp    Fundic Gland   Gastroparesis    Headache(784.0)    Heart murmur     Echocardiogram 2/11: EF 60-65%, mild LAE, grade 1 diastolic dysfunction, aortic valve sclerosis, mean gradient 9 mm of mercury, PASP 34   Hematuria 03/16/2016   Iron  deficiency anemia, unspecified    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   Leg swelling    bilateral   Neck pain 04/22/2015   PONV (postoperative nausea and vomiting)    pt states body temperature drops every time she has anesthesia; pt states only needs small amount of anesthesia   PSVT (paroxysmal supraventricular tachycardia) (HCC)    Pure hypercholesterolemia    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   Renal insufficiency 06/18/2019   pt states  L kidney function very low-functions at about 20%   Stroke [facility])    tia, 2014   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   Type II or unspecified type diabetes mellitus without mention of complication, not stated as uncontrolled    Unspecified essential hypertension    Unspecified hereditary and idiopathic peripheral neuropathy 10/29/2013   Vitamin D  deficiency      Social History   Socioeconomic History   Marital status: Widowed    Spouse name: Not on file   Number of children: 0   Years of education: college   Highest education level: Not on file  Occupational History   Occupation: retired  Tobacco Use   Smoking status: Never   Smokeless tobacco: Never  Tobacco comments:    Never used tobacco  Vaping Use   Vaping status: Never Used  Substance and Sexual Activity   Alcohol  use: No    Alcohol /week: 0.0 standard drinks of alcohol    Drug use: No   Sexual activity: Not Currently    Partners: Male    Birth control/protection: Post-menopausal    Comment: lives alone, no dietary restrictions except avoid fresh veg, fruit, whole grains  Other Topics Concern   Not on file  Social History Narrative   Patient was married ([name]) - widow   Patient does not have any children.   Patient is right-handed.   Patient has a BA degree.   One caffeine drink daily    Social Drivers of Research scientist (physical sciences) Strain: Low Risk  (09/07/2022)   Overall Financial Resource Strain (CARDIA)    Difficulty of Paying Living Expenses: Not very hard  Food Insecurity: No Food Insecurity (06/30/2022)   Hunger Vital Sign    Worried About Running Out of Food in the Last Year: Never true    Ran Out of Food in the Last Year: Never true  Transportation Needs: No Transportation Needs (06/30/2022)   PRAPARE - Administrator, Civil Service (Medical): No    Lack of Transportation (Non-Medical): No  Recent Concern: Transportation Needs - Unmet Transportation Needs (06/30/2022)   PRAPARE - Administrator, Civil Service (Medical): Yes    Lack of Transportation (Non-Medical): No  Physical Activity: Inactive (09/07/2022)   Exercise Vital Sign    Days of Exercise per Week: 0 days    Minutes of Exercise per Session: 0 min  Stress: No Stress Concern Present (09/07/2022)   [employer] of Occupational Health - Occupational Stress Questionnaire    Feeling of Stress : Only a little  Recent Concern: Stress - Stress Concern Present (07/10/2022)   [employer] of Occupational Health - Occupational Stress Questionnaire    Feeling of Stress : To some extent  Social Connections: Moderately Isolated (09/07/2022)   Social Connection and Isolation Panel [NHANES]    Frequency of Communication with Friends and Family: More than three times a week    Frequency of Social Gatherings with Friends and Family: Once a week    Attends Religious Services: More than 4 times per year    Active Member of [employer] or Organizations: No    Attends Banker Meetings: Never    Marital Status: Widowed  Intimate Partner Violence: Not At Risk (06/30/2022)   Humiliation, Afraid, Rape, and Kick questionnaire    Fear of Current or Ex-Partner: No    Emotionally Abused: No    Physically Abused: No    Sexually Abused: No    Past Surgical History:  Procedure Laterality Date   BREAST BIOPSY Right 06/13/2022   US  RT  BREAST BX W LOC DEV 1ST LESION IMG BX SPEC US  GUIDE 06/13/2022 [facility] MAMMOGRAPHY   BREAST LUMPECTOMY Right 07/31/2022   Procedure: RIGHT BREAST LUMPECTOMY;  Surgeon: Oza Blumenthal, MD;  Location: [facility];  Service: General;  Laterality: Right;   CHOLECYSTECTOMY  1993   COLONOSCOPY  11/11/2010   diverticulosis   DILATATION & CURRETTAGE/HYSTEROSCOPY WITH RESECTOCOPE N/A 02/25/2013   Procedure: Attempted hysteroscopy with uterine perforation;  Surgeon: Iva Mariner de Epifania Haskell, MD;  Location: [facility];  Service: Gynecology;  Laterality: N/A;   ESOPHAGOGASTRODUODENOSCOPY  08/29/2010; 09/15/2010   Carcinoid tumor less than 1 cm in July 2012 not  seen in August 2012 , gastritis, fundic gland polyps   ESOPHAGOGASTRODUODENOSCOPY  05/16/2011   ESOPHAGOGASTRODUODENOSCOPY  06/14/2012   EUS  12/15/2010   Procedure: UPPER ENDOSCOPIC ULTRASOUND (EUS) LINEAR;  Surgeon: Hoyt Macleod, MD;  Location: [facility];  Service: Endoscopy;  Laterality: N/A;   EYE SURGERY Bilateral    Bi lateral cateracts and bi lateral laser   LAPAROSCOPY N/A 02/25/2013   Procedure: Cystoscopy and laparoscopy with fulguration of uterine serosa;  Surgeon: Iva Mariner de Epifania Haskell, MD;  Location: [facility];  Service: Gynecology;  Laterality: N/A;   TONSILLECTOMY      Family History  Problem Relation Age of Onset   Diabetes Mother    Stroke Father        deceased age 77   Heart disease Sister        deceased MI age 89   Diabetes Sister    Heart disease Sister    Hypertension Sister    Hyperlipidemia Sister    Diabetes Sister    Heart disease Sister    Hypertension Sister    Hyperlipidemia Sister    Heart disease Brother        deceased MI age 15   Intellectual disability Brother    Diabetes Brother    Heart disease Brother    Hypertension Brother    Hyperlipidemia Brother    Diabetes Maternal Grandmother    Hypertension Paternal Grandmother    Colon cancer Neg Hx    Esophageal cancer  Neg Hx    Stomach cancer Neg Hx    Rectal cancer Neg Hx     Allergies  Allergen Reactions   Tramadol  Other (See Comments)    Dizziness     Current Outpatient Medications on File Prior to Visit  Medication Sig Dispense Refill   acetaminophen  (TYLENOL ) 325 MG tablet Take 2 tablets (650 mg total) by mouth every 6 (six) hours as needed for up to 30 doses for mild pain or moderate pain. 30 tablet 0   amLODipine  (NORVASC ) 5 MG tablet TAKE 1 TABLET BY MOUTH DAILY 100 tablet 2   anastrozole  (ARIMIDEX ) 1 MG tablet Take 1 tablet (1 mg total) by mouth daily. 90 tablet 6   aspirin  EC 81 MG tablet Take 81 mg by mouth daily. Swallow whole.     betamethasone  valerate ointment (VALISONE ) 0.1 % Apply a small amount to affected area topically every other day. 45 g 1   Blood Glucose Monitoring Suppl (ONE TOUCH ULTRA 2) w/Device KIT USE TO CHECK BLOOD SUGAR AS DIRECTED 1 kit 0   calcium -vitamin D  (OSCAL WITH D) 500-5 MG-MCG tablet Take 1 tablet by mouth daily.     cholecalciferol  (VITAMIN D ) 1000 UNITS tablet Take 1,000 Units by mouth daily.     dicyclomine  (BENTYL ) 10 MG capsule Take 1 capsule (10 mg total) by mouth as needed for spasms. 60 capsule 0   famotidine  (PEPCID ) 40 MG tablet Take 1 tablet (40 mg total) by mouth daily. 30 tablet 4   furosemide  (LASIX ) 20 MG tablet TAKE 1 TABLET BY MOUTH DAILY AS  NEEDED 100 tablet 2   hyoscyamine  (LEVSIN SL) 0.125 MG SL tablet Place 1 tablet (0.125 mg total) under the tongue every 4 (four) hours as needed. 30 tablet 3   Lancets (ONETOUCH ULTRASOFT) lancets USE AS DIRECTED 3 TIMES  DAILY 300 each 3   losartan  (COZAAR ) 50 MG tablet TAKE 1 TABLET BY MOUTH TWICE  DAILY 200 tablet 2   metFORMIN  (GLUCOPHAGE -XR) 500  MG 24 hr tablet TAKE 1 TABLET BY MOUTH IN THE  MORNING AND 1 TABLET BY MOUTH AT BEDTIME 200 tablet 2   metoCLOPramide  (REGLAN ) 5 MG tablet TAKE 1 TABLET BY MOUTH TWICE  DAILY AS NEEDED 180 tablet 2   metoprolol  tartrate (LOPRESSOR ) 50 MG tablet TAKE 1 TABLET  BY MOUTH TWICE  DAILY 200 tablet 3   Multiple Vitamin (MULTIVITAMIN ADULT PO) Take by mouth.     nystatin  (MYCOSTATIN ) 100000 UNIT/ML suspension Take 5 mLs (500,000 Units total) by mouth 4 (four) times daily. Gargle/swish in mouth for 5 minutes and then spit. 60 mL 0   ondansetron  (ZOFRAN ) 4 MG tablet Take 1 tablet (4 mg total) by mouth every 6 (six) hours as needed. 30 tablet 5   ONETOUCH ULTRA test strip CHECK BLOOD SUGAR 3 TIMES DAILY  OR AS NEEDED 300 strip 2   pantoprazole  (PROTONIX ) 40 MG tablet Take 1 tablet (40 mg total) by mouth daily. 90 tablet 3   potassium chloride  (KLOR-CON  M) 10 MEQ tablet TAKE 1 TABLET BY MOUTH DAILY 100 tablet 2   rosuvastatin  (CRESTOR ) 20 MG tablet TAKE 1 TABLET BY MOUTH DAILY 100 tablet 1   thiamine  (VITAMIN B-1) 100 MG tablet Take 100 mg by mouth every other day.     vitamin E 180 MG (400 UNITS) capsule Take 400 Units by mouth daily.     No current facility-administered medications on file prior to visit.    BP 130/66   Pulse 60   Temp 98.3 F (36.8 C)   Resp 18   Ht 5\' 1"  (1.549 m)   Wt 164 lb (74.4 kg)   LMP 02/07/1992   SpO2 97%   BMI 30.99 kg/m        Objective:   Physical Exam  General- No acute distress. Pleasant patient. Neck- Full range of motion, no jvd Lungs- Clear, even and unlabored. Heart- regular rate and rhythm. Neurologic- CNII- XII grossly intact.  Heent- palate and pharynx normal. Left submandular node mild enlarged and tender No neck stiffness. Canals clear and normal tms.    Assessment & Plan:   Patient Instructions  1. Pharyngitis. history of strep throat with persisting palate pain and enlarged left submandibular lymph node. -concern for persisting strep throat as on review strep throat + 6 months ago and again 2 weeks ago.Your strep test was negative. However, your physical exam and history causes concern for strep and it is important to note that rapid strep test can be falsely negative. So I am going to give you  a antibiotic today based on your exam and clinical presentation. Will send out throat culture as well  Rest hydrate, tylenol  for fever, and warm salt water gargles.   Follow up in 7 days or as needed.    - POCT rapid strep A - Culture, Group A Strep  2. Other fatigue - POCT Influenza A/B - POC COVID-19  3. Lymphadenopathy - CBC w/Diff   Follow up in 7 days or sooner if needed. Based on exam and send out throat culture may refer you to ent.    Aylssa Herrig, PA-C

## 2023-06-05 NOTE — Therapy (Addendum)
 OUTPATIENT PHYSICAL THERAPY TREATMENT   Patient Name: [name] MRN: [id number] DOB:[birth year], 83 y.o., female Today's Date: 06/05/2023   END OF SESSION:  PT End of Session - 06/05/23 1457     Visit Number 11    Date for PT Re-Evaluation 07/10/23    Authorization Type [employer] Medicare    Authorization Time Period 05/08/23 - 06/05/23    Authorization - Visit Number 5    Authorization - Number of Visits 9    Progress Note Due on Visit 20    PT Start Time 1452    PT Stop Time 1531    PT Time Calculation (min) 39 min    Activity Tolerance Patient tolerated treatment well    Behavior During Therapy [facility] for tasks assessed/performed                   Past Medical History:  Diagnosis Date   Abdominal pain in female 03/18/2010   Qualifier: Diagnosis of  By: Willy Harvest MD, Kelleen Patee E    Anemia 06/08/2014   Anxiety    Arthritis    Spinal Osteoarthritis   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 09/05/2006   Qualifier: Diagnosis of  By: Georganne Kind RMA, Lucy     Diabetic peripheral neuropathy (HCC) 10/29/2013   Encounter for preventative adult health care exam with abnormal findings 09/14/2013   Esophageal reflux    Gastric polyp    Fundic Gland   Gastroparesis    Headache(784.0)    Heart murmur    Echocardiogram 2/11: EF 60-65%, mild LAE, grade 1 diastolic dysfunction, aortic valve sclerosis, mean gradient 9 mm of mercury, PASP 34   Hematuria 03/16/2016   Iron  deficiency anemia, unspecified    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   Leg swelling    bilateral   Neck pain 04/22/2015   PONV (postoperative nausea and vomiting)    pt states body temperature drops every time she has anesthesia; pt states only needs small amount of anesthesia   PSVT (paroxysmal supraventricular  tachycardia) (HCC)    Pure hypercholesterolemia    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   Renal insufficiency 06/18/2019   pt states  L kidney function very low-functions at about 20%   Stroke [facility])    tia, 2014   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   Type II or unspecified type diabetes mellitus without mention of complication, not stated as uncontrolled    Unspecified essential hypertension    Unspecified hereditary and idiopathic peripheral neuropathy 10/29/2013   Vitamin D  deficiency    Past Surgical History:  Procedure Laterality Date   BREAST BIOPSY Right 06/13/2022   US  RT BREAST BX W LOC DEV 1ST LESION IMG BX SPEC US  GUIDE 06/13/2022 [facility] MAMMOGRAPHY   BREAST LUMPECTOMY Right 07/31/2022   Procedure: RIGHT BREAST LUMPECTOMY;  Surgeon: Oza Blumenthal, MD;  Location: [facility];  Service: General;  Laterality: Right;   CHOLECYSTECTOMY  1993   COLONOSCOPY  11/11/2010   diverticulosis   DILATATION & CURRETTAGE/HYSTEROSCOPY WITH RESECTOCOPE N/A 02/25/2013   Procedure: Attempted hysteroscopy with uterine perforation;  Surgeon: Iva Mariner de Epifania Haskell, MD;  Location: [facility];  Service: Gynecology;  Laterality: N/A;   ESOPHAGOGASTRODUODENOSCOPY  08/29/2010; 09/15/2010   Carcinoid tumor less  than 1 cm in July 2012 not seen in August 2012 , gastritis, fundic gland polyps   ESOPHAGOGASTRODUODENOSCOPY  05/16/2011   ESOPHAGOGASTRODUODENOSCOPY  06/14/2012   EUS  12/15/2010   Procedure: UPPER ENDOSCOPIC ULTRASOUND (EUS) LINEAR;  Surgeon: Hoyt Macleod, MD;  Location: [facility];  Service: Endoscopy;  Laterality: N/A;   EYE SURGERY Bilateral    Bi lateral cateracts and bi lateral laser   LAPAROSCOPY N/A 02/25/2013   Procedure: Cystoscopy and laparoscopy with fulguration of uterine serosa;  Surgeon: Iva Mariner de Epifania Haskell, MD;  Location: [facility];  Service: Gynecology;  Laterality: N/A;   TONSILLECTOMY     Patient Active Problem List   Diagnosis Date Noted   Precordial chest pain  12/24/2022   Carcinoma of breast upper outer quadrant, right (HCC) 07/04/2022   SVT (supraventricular tachycardia) (HCC) 06/21/2022   Left-sided weakness 04/02/2022   Unsteady gait 04/02/2022   Oral lesion 03/08/2021   Sun-damaged skin 03/08/2021   Thiamine  deficiency 11/01/2020   Trigeminal neuralgia 08/21/2020   Pedal edema 08/21/2020   Chronic left-sided back pain 03/28/2020   Nocturia 03/28/2020   Left-sided headache 03/28/2020   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   Renal insufficiency 06/18/2019   Educated about COVID-19 virus infection 05/15/2019   Atrophic vaginitis 01/20/2019   Stenosis of carotid artery 11/12/2018   Anxiety 10/13/2018   Referred otalgia of left ear 04/23/2018   Chronic throat clearing 04/23/2018   Sinusitis 10/11/2017   Headache 07/12/2017   Dizzy spells 11/21/2016   Constipation 11/21/2016   Nonrheumatic aortic valve stenosis 05/23/2016   Aortic atherosclerosis (HCC) 05/23/2016   Hematuria 03/16/2016   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   Pain of upper abdomen 06/06/2015   Neck pain 04/22/2015   Ear pain 02/28/2015   Abnormal urine 11/21/2014   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   Anemia 06/08/2014   RLS (restless legs syndrome) 11/23/2013   Diabetic peripheral neuropathy (HCC) 10/29/2013   Left-sided thoracic back pain 10/06/2013   Encounter for preventative adult health care exam with abnormal findings 09/14/2013   Iron  deficiency anemia    Status post laparoscopy 02/25/2013   Hyponatremia 01/09/2013   GERD (gastroesophageal reflux disease) 01/09/2013   Amaurosis fugax of left eye 10/16/2012   Low back pain 06/03/2012   Vitamin D  deficiency 03/11/2012   Bilateral hand pain 10/20/2011   Encounter for long-term (current) use of other medications 10/20/2011   IBS (irritable bowel syndrome) 08/14/2011   TIA (transient ischemic attack) 02/10/2011   Abnormal brain CT 01/19/2011   Allergic rhinitis 10/01/2010   Carcinoid tumor of  stomach- history of 09/29/2010   Preventative health care 07/15/2010   FUNDIC GLAND POLYPS OF STOMACH 03/18/2010   Abdominal pain in female 03/18/2010   Left hip pain 03/17/2009   SYSTOLIC MURMUR 03/02/2009   Paroxysmal supraventricular tachycardia (HCC) 01/12/2009   PLANTAR FASCIITIS 06/08/2008   CHEST PAIN 05/18/2008   Gastroparesis 12/18/2007   HYPERCHOLESTEROLEMIA 06/11/2007   Type 2 diabetes mellitus with obesity (HCC) 09/05/2006   Essential hypertension 09/05/2006    PCP: Neda Balk, MD   REFERRING PROVIDER: Neda Balk, MD   REFERRING DIAG:  M79.10 (ICD-10-CM) - Myalgia  R26.81 (ICD-10-CM) - Unsteady gait  R53.1 (ICD-10-CM) - Weakness   THERAPY DIAG:  Muscle weakness (generalized)  Other abnormalities of gait and mobility  Unsteadiness on feet  RATIONALE FOR EVALUATION AND TREATMENT: Rehabilitation  ONSET DATE: Worsening over the past year, especially since breast cancer diagnosis and May 2024  NEXT MD VISIT: 07/24/2023   SUBJECTIVE:                                                                                                                                                                                                         SUBJECTIVE STATEMENT: [name] is having pain in neck and shoulder. Is feeling tired/fatigue.  EVAL: Pt reports her weakness and pains seems to be exacerbated by recent meds she has had to take. She notes difficulty climbing stairs and does not feel safe using a step stool to reach high things.  She feels like she has no energy.  She reports chronic belly pain, maybe from meds vs diverticulitis.  L hip pain has been an issue for several months but patient has been told there is no "bony" reason for her pain (x-rays negative).  PAIN: Are you having pain? No and Yes: NPRS scale: 5/10  Pain location: L lateral hip Pain description: dull, sharp when more intense  Aggravating factors: lying on side to sleep (worse when lying on L  than R), first moving in the morning Relieving factors: walking, Tylenol    Are you having pain? Yes: NPRS scale: 7/10 Pain location: L>R upper shoulders Pain description: pinching Aggravating factors: uncertain Relieving factors: keep moving  PERTINENT HISTORY:  Extensive PMH including chronic back and neck pain, OA, osteoporosis, DM, HTN, h/o TIA, diabetic peripheral neuropathy, PVD, headaches, sleep dysfunction, anxiety, R breast cancer s/p lumpectomy 07/31/22, SVT (refer to above problem list and past medical/surgical history for full PMH)   PRECAUTIONS: Fall  RED FLAGS: None  WEIGHT BEARING RESTRICTIONS: No  FALLS:  Has patient fallen in last 6 months? No - admits to a few close calls due to dizziness/lightheadness  LIVING ENVIRONMENT: Lives with: lives alone  Lives in: House/apartment Stairs: No Has following equipment at home: Single point cane, Environmental consultant - 4 wheeled, shower chair, and Grab bars  OCCUPATION: Retired  PLOF: Independent, Needs assistance with homemaking, Leisure: read and play card games on her phone, traveling, and housekeeping/cleaning service ~1x/month    PATIENT GOALS: "To be able to walk steady and feel comfortable going up stairs so I can visit my niece and nephew. To be able to carry groceries."   OBJECTIVE: (objective measures completed at initial evaluation unless otherwise dated)  DIAGNOSTIC FINDINGS:  01/22/23 & 10/05/22 - DH Left hip (2 several months of L hip pain) IMPRESSION: Negative.  COGNITION: Overall cognitive status: Within functional limits for tasks assessed   SENSATION: Numbness and tingling in hands and feet from DM and cervical radiculopathy  POSTURE:  rounded shoulders, forward head, increased thoracic kyphosis, and flexed trunk   PALPATION: Increased TTP over L TFL, hip flexors and abductors  UPPER EXTREMITY MMT:  MMT Right eval Left eval  Shoulder flexion 4- 4-  Shoulder extension 3+ 3+  Shoulder abduction 4- 4-   Shoulder adduction    Shoulder internal rotation 4- 4-  Shoulder external rotation 3+ 3+  Middle trapezius    Lower trapezius     (Blank rows = not tested)  MUSCLE LENGTH: Hamstrings: mod tight B ITB: mod tight B  Piriformis: severe tight B Hip flexors: mild/mod tight L>R  Quads: mild/mod tight L>R  Heelcord: mod tight B  LOWER EXTREMITY ROM:    Mildly limited primarily due to muscle tightness  LOWER EXTREMITY MMT:  (tested in sitting on eval)  MMT Right eval Left eval R 05/29/23 L 05/29/23  Hip flexion 3+ 3+ 4- 4-  Hip extension 3- 3- 4- 4-  Hip abduction 3+ 3+ 4 4  Hip adduction 3+ 3+ 4 4  Hip internal rotation 3- 3- 4 4-  Hip external rotation 3- 3- 4 4  Knee flexion 4- 4- 4+ 4  Knee extension 4- 4 4+ 4+  Ankle dorsiflexion 3+ 4- 4 4  Ankle plantarflexion      Ankle inversion      Ankle eversion      (Blank rows = not tested)  BED MOBILITY:  Sit to supine Min A Supine to sit Min A Rolling to Right SBA  TRANSFERS: Assistive device utilized: None  Sit to stand: Complete Independence Stand to sit: Complete Independence Chair to chair: Modified independence Floor:  NT  GAIT: Distance walked: Clinic distances Assistive device utilized: None Level of assistance: Complete Independence and SBA Gait pattern: decreased stride length and trunk flexed Comments: Decreased gait speed  RAMP: Level of Assistance:  NT Assistive device utilized:    Ramp Comments:   CURB:  Level of Assistance:  NT Assistive device utilized:    Curb Comments:   STAIRS: Level of Assistance: SBA Stair Negotiation Technique: Step to Pattern, Sideways (descent), Forwards (ascent) with Single Rail on Right Number of Stairs: 14  Height of Stairs: 7"  Comments: forward for ascent, sideways leading with R foot for descent    FUNCTIONAL TESTS: (TBA next visit) 5 times sit to stand: 30.43 sec; >15 sec indicates a risk for recurrent falls  Timed up and go (TUG): 16.03 sec; >13.5 sec  indicates a high risk for falls  10 meter walk test: 13.91 sec ; Gait speed = 2.36 ft/sec; >/=2.62 ft/sec is necessary to be considered a Materials engineer Scale: 44/56; Scores of 37-45 indicate significant risk for falls (>80%)  Dynamic Gait Index: 15/24; Scores of 19 or less are predictive of falls in older community living adults.    05/03/23   TUG: 13.5 sec  05/15/23 = 11.78 sec Gait speed = 2.78 ft/sec  05/29/23 5xSTS = 25.78 sec; ; >15 sec indicates a risk for recurrent falls Berg = 50/56; Scores of 46-51 indicate moderate (>50%) risk for falls DGI = 18/24; Scores of 19 or less are predicitve of falls in older community living adults.  PATIENT SURVEYS:  ABC scale 580 / 1600 = 36.3 % , scores of <50% indicating a low level of physical functioning and scores of <67% indicating a risk for falls in patients with vestibular disorders 05/29/23: 770 / 1600 = 48.1 %   TODAY'S TREATMENT:   06/05/23  THERAPEUTIC EXERCISE: To improve strength and endurance.  Demonstration, verbal and tactile cues throughout for technique. NuStep L2 x 6 min LS stretch x 30" B  NEUROMUSCULAR RE-EDUCATION: To improve coordination, kinesthesia, posture, and proprioception. Seated horizontal ABD w/ YTB - 2 x 10 Standing scap retraction + shoulder row w/ YTB x 10 Standing scap retraction + ext w/ YTB x 10 "V" Wall slides x 10 "V" Wall slides w/ YTB x 10 Wall Clock Reaches w/ YTB x 4 B 6 in forward step up w/ counter support x 10 B 6 in lateral step down w/ counter support x 10 B  05/29/23 - Recert THERAPEUTIC EXERCISE: To improve strength and endurance.  Demonstration, verbal and tactile cues throughout for technique. NuStep - L5 x 6 min  VS: BP = 130/62 HR = 66 SpO2 = 98%  THERAPEUTIC ACTIVITIES: To improve functional performance.  Demonstration, verbal and tactile cues throughout for technique. LE MMT Goal assessment  PHYSICAL PERFORMANCE TEST or MEASUREMENT: 5xSTS = 25.78  sec; ; >15 sec indicates a risk for recurrent falls Berg = 50/56; Scores of 46-51 indicate moderate (>50%) risk for falls DGI = 18/24; Scores of 19 or less are predicitve of falls in older community living adults.   Berg Balance Test   Sit to Stand Able to stand without using hands and stabilize independently    Standing Unsupported Able to stand safely 2 minutes    Sitting with Back Unsupported but Feet Supported on Floor or Stool Able to sit safely and securely 2 minutes    Stand to Sit Sits safely with minimal use of hands    Transfers Able to transfer safely, minor use of hands    Standing Unsupported with Eyes Closed Able to stand 10 seconds safely    Standing Unsupported with Feet Together Able to place feet together independently and stand 1 minute safely    From Standing, Reach Forward with Outstretched Arm Can reach confidently >25 cm (10")    From Standing Position, Pick up Object from Floor Able to pick up shoe safely and easily    From Standing Position, Turn to Look Behind Over each Shoulder Looks behind one side only/other side shows less weight shift    Turn 360 Degrees Able to turn 360 degrees safely one side only in 4 seconds or less    Standing Unsupported, Alternately Place Feet on Step/Stool Able to stand independently and complete 8 steps >20 seconds    Standing Unsupported, One Foot in Front Able to plae foot ahead of the other independently and hold 30 seconds    Standing on One Leg Able to lift leg independently and hold equal to or more than 3 seconds    Total Score 50    Berg comment: 46-51 moderate (>50%)      Dynamic Gait Index   Level Surface Normal    Change in Gait Speed Moderate Impairment    Gait with Horizontal Head Turns Normal    Gait with Vertical Head Turns Normal    Gait and Pivot Turn Mild Impairment    Step Over Obstacle Mild Impairment    Step Around Obstacles Normal    Steps Moderate Impairment    Total Score 18    DGI comment: Scores of 19 or  less are predicitve of falls in older community living adults.       05/17/23 THERAPEUTIC EXERCISE: To improve strength and endurance.  Demonstration, verbal and tactile cues throughout for technique.  NuStep - L4  x 6 min (UE/LE)  MANUAL THERAPY: To promote normalized muscle tension and reduced pain utilizing connective tissue massage, therapeutic massage, and manual TP therapy. STM/DTM to L UT, LS and periscapular muscles  NEUROMUSCULAR RE-EDUCATION: To improve balance, coordination, kinesthesia, posture, and proprioception. Seated on dynadisc on mat table with feet on Airex pad atop 4" step YTB scap retraction + B shoulder rows x 10 YTB scap retraction + B shoulder extension x 10 YTB B pallof press x 10 Hip flexion march x 10 - pt c/o R hip/groin pain at end of set Attempted LAQ but discontinued d/t R hip pain Seated alt LAQ x 10 - pt reports better tolerance  THERAPEUTIC ACTIVITIES: To improve functional performance.  Demonstration, verbal and tactile cues throughout for technique. Fwd step-up to 6" step x 10 bil, single UE support on back of chair to simulate handrail B lateral step-up to 6" step with B UE support on window sill x 5  PATIENT EDUCATION:  Education details: progress with PT, continue with current HEP, and work toward completing [place] 3x/wk   Person educated: Patient Education method: Explanation Education comprehension: verbalized understanding  HOME EXERCISE PROGRAM: Access Code: [email or web address] URL: [email or web address] Date: 04/24/2023 Prepared by: Felecia Hopper  Patient Education - Check for Safety - [place] Program   ASSESSMENT:  CLINICAL IMPRESSION: [name] reports neck and shoulder pain and fatigue/tiredness. She came in with sore throat but was masked and cleared for COVID, flu and strep throat at her MD visit prior to PT and she pushed through today's session. Because she reported neck and shoulder pain, we initially focused on postural and  scapular strengthening exercises. Incorporated V wall slides and clock reaches with resistance to improve muscle strength and posture which she tolerated well. Incorporated step up and lateral step downs to help improve LE strength, balance and functional movement. She continues to be independent with HEP/[place] program. [name] will benefit from continued skilled PT to address ongoing strength and balance deficits to improve mobility and activity tolerance with decreased pain interference and decreased risk for falls.  OBJECTIVE IMPAIRMENTS: Abnormal gait, decreased activity tolerance, decreased balance, decreased coordination, decreased endurance, decreased knowledge of condition, decreased knowledge of use of DME, decreased mobility, difficulty walking, decreased ROM, decreased strength, decreased safety awareness, dizziness, increased fascial restrictions, impaired perceived functional ability, increased muscle spasms, impaired flexibility, impaired sensation, impaired UE functional use, improper body mechanics, postural dysfunction, and pain.   ACTIVITY LIMITATIONS: carrying, lifting, bending, standing, squatting, sleeping, stairs, transfers, bed mobility, and locomotion level  PARTICIPATION LIMITATIONS: meal prep, cleaning, laundry, driving, shopping, community activity, and church  PERSONAL FACTORS: Age, Fitness, Past/current experiences, Social background, Time since onset of injury/illness/exacerbation, and 3+ comorbidities: Extensive PMH including chronic back and neck pain, OA, osteoporosis, DM, HTN, h/o TIA, diabetic peripheral neuropathy, PVD, headaches, sleep dysfunction, anxiety, R breast cancer s/p lumpectomy 07/31/22, SVT (refer to above problem list and past medical/surgical history for full PMH)   are also affecting patient's functional outcome.   REHAB POTENTIAL: Good  CLINICAL DECISION MAKING: Unstable/unpredictable  EVALUATION COMPLEXITY: High   GOALS: Goals reviewed with  patient? Yes  SHORT TERM GOALS: Target date: 05/01/2023  Patient will be independent with initial HEP to improve outcomes and carryover.  Baseline:  Goal status: MET - 05/01/23 - pt denies any questions or concerns with [place] fall prevention program  2.  Patient will be educated on strategies to decrease risk of falls.  Baseline:  Goal status: MET - 04/24/23  3.  Patient will  demonstrate decreased TUG time to </= 13.5 sec to decrease risk for falls with transitional mobility. Baseline:  Goal status: MET - 13.5 sec - 05/03/23  LONG TERM GOALS: Target date: 05/29/2023, extended to 07/10/2023  Patient will be independent with advanced/ongoing HEP to facilitate ability to maintain/progress functional gains from skilled physical therapy services. Baseline:  Goal status: IN PROGRESS - 05/29/23 - pt reports completing the [facility] Prevention program ~2x/wk currently  2.  Patient will be able to ambulate 600' with or w/o LRAD on variable surfaces with good safety to access community.  Baseline:  Goal status: IN PROGRESS - 05/29/23 - no issues on indoors surfaces, deferred outdoor assessment due to weather  3.  Patient will be able to step up/down curb safely with or w/o LRAD for safety with community ambulation.  Baseline:  Goal status: IN PROGRESS - 05/29/23 - not assessed  4.  Patient will demonstrate improved B LE strength to >/= 4/5 for improved stability and ease of mobility. Baseline: Refer to above LE MMT table Goal status: PARTIALLY MET - 05/29/23 - gains noted in all muscle groups, but B hip flexion, extension and L hip IR still 4-/5  5.  Patient will improve 5x STS time to </= 15 seconds for improved efficiency and safety with transfers. Baseline: 30.43 sec (04/09/23) Goal status: IN PROGRESS - 05/29/23 - 25.78 sec  6.  Patient will demonstrate gait speed of >/= 2.62 ft/sec (0.8 m/s) to be a safe community ambulator with decreased risk for recurrent falls.  Baseline: 2.36 ft/sec  (04/09/23) Goal status: MET - 05/15/23 - 2.78 ft/sec  7.  Patient will improve Berg score by at least 8 points to improve safety and stability with ADLs in standing and reduce risk for falls.  Baseline: 44/56 (04/09/23) Goal status: IN PROGRESS - 05/29/23 - 50/56  8.  Patient will demonstrate at least 19/24 on DGI or 19/30 on FGA to improve gait stability and reduce risk for falls. Baseline: DGI = 15/24 (04/09/23) Goal status: IN PROGRESS - 05/29/23 - DGI = 18/24  9. Patient will report >/= 50% on ABC scale to demonstrate improved balance confidence with functional mobility and gait. Baseline: 580 / 1600 = 36.3 % Goal status: IN PROGRESS - 05/29/23 - 770 / 1600 = 48.1 %   PLAN:  PT FREQUENCY: 2x/week  PT DURATION: 8 weeks  PLANNED INTERVENTIONS: 97164- PT Re-evaluation, 97110-Therapeutic exercises, 97530- Therapeutic activity, 97112- Neuromuscular re-education, 97535- Self Care, 16109- Manual therapy, [phone]- Gait training, [phone]- Canalith repositioning, [id number]- Electrical stimulation (unattended), 97035- Ultrasound, [id number]- Ionotophoresis 4mg /ml Dexamethasone , Patient/Family education, Balance training, Stair training, Taping, Dry Needling, Joint mobilization, Spinal mobilization, Vestibular training, Cryotherapy, Moist heat, and 97750- Physical performance test or measurement  PLAN FOR NEXT SESSION: Review [place] fall prevention program PRN; LE strengthening - functional emphasis for stair negotiation; progress balance training   Jacelyn Martinez, Student-PT 06/05/2023, 3:35 PM    Date of referral: 01/16/2023 Referring provider: Neda Balk, MD Referring diagnosis?  M79.10 (ICD-10-CM) - Myalgia  R26.81 (ICD-10-CM) - Unsteady gait  R53.1 (ICD-10-CM) - Weakness  Treatment diagnosis? (if different than referring diagnosis)  Muscle weakness (generalized)  Other abnormalities of gait and mobility  Unsteadiness on feet  What was this (referring dx) caused by? Ongoing Issue and Other:  Sequelae of treatments for breast cancer  Nature of Condition: Chronic (continuous duration > 3 months)   Laterality: Lt  Current Functional Measure Score: Other ABC scale = 770 / 1600 = 48.1 % (05/29/23);  580 / 1600 = 36.3 % as of eval on 04/24/23  Objective measurements identify impairments when they are compared to normal values, the uninvolved extremity, and prior level of function.  [x]  Yes  []  No  Objective assessment of functional ability: Severe functional limitations   Briefly describe symptoms: Patient presents with physical impairments of L anterior lateral hip pain, impaired activity tolerance, global UE/LE weakness, dizziness, impaired standing balance, impaired ambulation, inability to navigate stairs and decreased safety awareness impacting safe and independent functional mobility.  Gains noted in LE strength and across all standardized balance tests since start of PT but remain shy of PT goals.  How did symptoms start: Gradual onset exacerbated by treatment for breast cancer starting May 2024  Average pain intensity:  Last 24 hours: 7/10  Past week: 8/10  How often does the pt experience symptoms? Frequently  How much have the symptoms interfered with usual daily activities? Quite a bit  How has condition changed since care began at this facility? Better  In general, how is the patients overall health? Fair  Onset date: Chronic   BACK PAIN (STarT Back Screening Tool) - (When applicable): N/A  Has your back pain spread down your leg(s) at sometime in the last 2 weeks? []  Yes   []  No Have you had pain in the shoulder or neck at sometime in the past 2 weeks? []  Yes   []  No Have you only walked short distances because of your back pain? []  Yes   []  No In the past 2 weeks, have you dressed more slowly than usual because of your back pain? []  Yes   []  No Do you think it is not really safe for person with a condition like yours to be physically active? []  Yes   []   No Have worrying thoughts been going through your mind a lot of the time? []  Yes   []  No Do you feel that your back pain is terrible and it is never going to get any better? []  Yes   []  No In general, have you stopped enjoying all the things you usually enjoy? []  Yes   []  No Overall, how bothersome has your back pain been in the last 2 weeks? []  Not at all   []  Slightly     []  Moderate   []  Very much     []  Extremely

## 2023-06-05 NOTE — Patient Instructions (Addendum)
 1. Pharyngitis. history of strep throat with persisting palate pain and enlarged left submandibular lymph node. -concern for persisting strep throat as on review strep throat + 6 months ago and again 2 weeks ago.Your strep test was negative. However, your physical exam and history causes concern for strep and it is important to note that rapid strep test can be falsely negative. So I am going to give you a antibiotic today based on your exam and clinical presentation. Will send out throat culture as well  Rest hydrate, tylenol  for fever, and warm salt water gargles.   Follow up in 7 days or as needed.    - POCT rapid strep A - Culture, Group A Strep  2. Other fatigue - POCT Influenza A/B(negative) - POC COVID-19(negative)  3. Lymphadenopathy - CBC w/Diff   Follow up in 7 days or sooner if needed. Based on exam and send out throat culture may refer you to ent.

## 2023-06-07 LAB — CULTURE, GROUP A STREP
Micro Number: 16388988
SPECIMEN QUALITY:: ADEQUATE

## 2023-06-11 ENCOUNTER — Telehealth: Payer: Self-pay | Admitting: Emergency Medicine

## 2023-06-11 NOTE — Telephone Encounter (Signed)
 Appt reminder for 06/12/23

## 2023-06-11 NOTE — Telephone Encounter (Signed)
 Copied from CRM [id number]. Topic: General - Other >> Jun 11, 2023  8:18 AM Dorisann Garre T wrote: Reason for CRM: patient Is needing a call back regarding a call from the office someone called her but she doesn't know who it was she would like a call back there where no notes in patient chart either

## 2023-06-12 ENCOUNTER — Ambulatory Visit: Attending: Family Medicine | Admitting: Physical Therapy

## 2023-06-12 ENCOUNTER — Encounter: Payer: Self-pay | Admitting: Medical

## 2023-06-12 ENCOUNTER — Telehealth (HOSPITAL_BASED_OUTPATIENT_CLINIC_OR_DEPARTMENT_OTHER): Payer: Self-pay

## 2023-06-12 ENCOUNTER — Ambulatory Visit (INDEPENDENT_AMBULATORY_CARE_PROVIDER_SITE_OTHER): Admitting: Medical

## 2023-06-12 VITALS — BP 137/79 | HR 64 | Temp 97.8°F | Resp 18 | Ht 61.0 in | Wt 165.2 lb

## 2023-06-12 DIAGNOSIS — M6281 Muscle weakness (generalized): Secondary | ICD-10-CM | POA: Insufficient documentation

## 2023-06-12 DIAGNOSIS — R591 Generalized enlarged lymph nodes: Secondary | ICD-10-CM

## 2023-06-12 DIAGNOSIS — J029 Acute pharyngitis, unspecified: Secondary | ICD-10-CM

## 2023-06-12 DIAGNOSIS — H9202 Otalgia, left ear: Secondary | ICD-10-CM

## 2023-06-12 DIAGNOSIS — R2681 Unsteadiness on feet: Secondary | ICD-10-CM | POA: Insufficient documentation

## 2023-06-12 DIAGNOSIS — M25552 Pain in left hip: Secondary | ICD-10-CM | POA: Insufficient documentation

## 2023-06-12 DIAGNOSIS — R2689 Other abnormalities of gait and mobility: Secondary | ICD-10-CM | POA: Insufficient documentation

## 2023-06-12 NOTE — Addendum Note (Signed)
 Addended by: Serafina Damme on: 06/12/2023 03:01 PM   Modules accepted: Orders

## 2023-06-12 NOTE — Patient Instructions (Signed)
 Lymphadenopathy with soar throat and ear pain. Previous antibiotics provided some improvement. Prefers to avoid further antibiotics. - Order ultrasound to evaluate lymphadenopathy. - Refer to ENT specialist.(You can call ent office on thursday if they don't you by then) - Advise Tylenol  for pain management.    Dental evaluation Scheduled for dental evaluation and x-rays to rule out infections. - Follow up with dental appointment and x-ray results.  Hypertension Hypertension present. Avoids ibuprofen  due to blood pressure concerns. -continue current bp meds  Follow up date to be determined after lab and imaging review.

## 2023-06-12 NOTE — Progress Notes (Signed)
 Subjective:    [name], female    DOB: 24-Dec-1940, 83 y.o.   MRN: [id number]  HPI  Pt in for follow up. This is 4th visit. She saw Heidi Llamas, Dr. Gwenette Lennox and then myself.   Last visit AVS below   1. Pharyngitis. history of strep throat with persisting palate pain and enlarged left submandibular lymph node. -concern for persisting strep throat as on review strep throat + 6 months ago and again 2 weeks ago.Your strep test was negative. However, your physical exam and history causes concern for strep and it is important to note that rapid strep test can be falsely negative. So I am going to give you a antibiotic today based on your exam and clinical presentation. Will send out throat culture as well   Rest hydrate, tylenol  for fever, and warm salt water gargles.    Follow up in 7 days or as needed.    - POCT rapid strep A - Culture, Group A Strep   2. Other fatigue - POCT Influenza A/B(negative) - POC COVID-19(negative)   3. Lymphadenopathy - CBC w/Diff    Pt studies came back negative.   Today hpi.   [name] is an 83 year old female who presents with persistent throat pain and ear discomfort.  She has been experiencing persistent throat pain and ear discomfort, prompting her third visit for this issue. She initially saw another physician on April 14th and May 3rd, during which she was prescribed amoxicillin . The second antibiotic azithromyc rx last visit improved her ability to swallow but did not alleviate the ear pain, which has since returned.  The pain is localized to the throat and ear still, accompanied by a sensation of something 'pumping or weight bleed back' in the throat. She also notes a dry sensation in her eye on the same side as the throat pain. No cheek pain or tooth pain when eating is reported.  She has experienced stomach upset and disturbed sleep as side effects from the antibiotics. She is currently using Tylenol  for pain  management and avoids ibuprofen  due to concerns about her blood pressure.     Review of Systems  Constitutional:  Negative for chills and fever.  HENT:  Positive for ear pain and sore throat. Negative for congestion.   Respiratory:  Negative for cough, choking, chest tightness and wheezing.   Cardiovascular:  Negative for chest pain and palpitations.  Gastrointestinal:  Negative for abdominal pain.  Genitourinary:  Negative for dysuria.  Musculoskeletal:  Negative for back pain and joint swelling.  Neurological:  Negative for dizziness, syncope, weakness and light-headedness.  Hematological:  Positive for adenopathy. Does not bruise/bleed easily.  Psychiatric/Behavioral:  Negative for confusion. The patient is not nervous/anxious.    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 09/05/2006   Qualifier: Diagnosis of  By: Georganne Kind RMA, Lucy     Diabetic peripheral neuropathy (HCC) 10/29/2013   Encounter for preventative adult health care exam with abnormal  findings 09/14/2013   Esophageal reflux    Gastric polyp    Fundic Gland   Gastroparesis    Headache(784.0)    Heart murmur    Echocardiogram 2/11: EF 60-65%, mild LAE, grade 1 diastolic dysfunction, aortic valve sclerosis, mean gradient 9 mm of mercury, PASP 34   Hematuria 03/16/2016   Iron  deficiency anemia, unspecified    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   Leg swelling    bilateral   Neck pain 04/22/2015   PONV (postoperative nausea and vomiting)    pt states body temperature drops every time she has anesthesia; pt states only needs  small amount of anesthesia   PSVT (paroxysmal supraventricular tachycardia) (HCC)    Pure hypercholesterolemia    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   Renal insufficiency 06/18/2019   pt states  L kidney function very low-functions at about 20%   Stroke [facility])    tia, 2014   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   Type II or unspecified type diabetes mellitus without mention of complication, not stated as uncontrolled    Unspecified essential hypertension    Unspecified hereditary and idiopathic peripheral neuropathy 10/29/2013   Vitamin D  deficiency      Social History   Socioeconomic History   Marital status: Widowed    Spouse name: Not on file   Number of children: 0   Years of education: college   Highest education level: Not on file  Occupational History   Occupation: retired  Tobacco Use   Smoking status: Never   Smokeless tobacco: Never   Tobacco comments:    Never used tobacco  Vaping Use   Vaping status: Never Used  Substance and Sexual Activity   Alcohol  use: No    Alcohol /week: 0.0 standard drinks of alcohol    Drug use: No   Sexual activity: Not Currently    Partners: Male    Birth control/protection: Post-menopausal    Comment: lives alone, no dietary restrictions except avoid fresh veg, fruit, whole grains  Other Topics Concern   Not on file  Social History Narrative   Patient was married ([name]) - widow   Patient does not have any children.   Patient is right-handed.   Patient has a BA degree.   One caffeine drink daily    Social Drivers of Corporate investment banker Strain: Low Risk  (09/07/2022)   Overall Financial Resource Strain (CARDIA)    Difficulty of Paying Living Expenses: Not very hard  Food Insecurity: No Food Insecurity (06/30/2022)   Hunger Vital Sign    Worried About Running Out of Food in the Last Year: Never true    Ran Out of Food in the Last Year: Never true  Transportation Needs: No Transportation Needs (06/30/2022)   PRAPARE - Scientist, research (physical sciences) (Medical): No    Lack of Transportation (Non-Medical): No  Recent Concern: Transportation Needs - Unmet Transportation Needs (06/30/2022)   PRAPARE - Administrator, Civil Service (Medical): Yes    Lack of Transportation (Non-Medical): No  Physical Activity: Inactive (09/07/2022)   Exercise Vital Sign    Days of Exercise per Week: 0 days    Minutes of Exercise per Session: 0 min  Stress: No Stress Concern Present (09/07/2022)   [employer] of Occupational Health - Occupational Stress Questionnaire    Feeling of Stress : Only a little  Recent Concern: Stress - Stress Concern Present (07/10/2022)   [employer] of Occupational Health - Occupational Stress Questionnaire  Feeling of Stress : To some extent  Social Connections: Moderately Isolated (09/07/2022)   Social Connection and Isolation Panel [NHANES]    Frequency of Communication with Friends and Family: More than three times a week    Frequency of Social Gatherings with Friends and Family: Once a week    Attends Religious Services: More than 4 times per year    Active Member of [employer] or Organizations: No    Attends Banker Meetings: Never    Marital Status: Widowed  Intimate Partner Violence: Not At Risk (06/30/2022)   Humiliation, Afraid, Rape, and Kick questionnaire    Fear of Current or Ex-Partner: No    Emotionally Abused: No    Physically Abused: No    Sexually Abused: No    Past Surgical History:  Procedure Laterality Date   BREAST BIOPSY Right 06/13/2022   US  RT BREAST BX W LOC DEV 1ST LESION IMG BX SPEC US  GUIDE 06/13/2022 [facility]MO[facility]   BREAST LUMPECTOMY Right 07/31/2022   Procedure: RIGHT BREAST LUMPECTOMY;  Surgeon: Oza Blumenthal, MD;  Location: [facility];  Service: General;  Laterality: Right;   CHOLECYSTECTOMY  1993   COLONOSCOPY  11/11/2010   diverticulosis   DILATATION & CURRETTAGE/HYSTEROSCOPY WITH RESECTOCOPE N/A 02/25/2013   Procedure:  Attempted hysteroscopy with uterine perforation;  Surgeon: Iva Mariner de Epifania Haskell, MD;  Location: [facility];  Service: Gynecology;  Laterality: N/A;   ESOPHAGOGASTRODUODENOSCOPY  08/29/2010; 09/15/2010   Carcinoid tumor less than 1 cm in July 2012 not seen in August 2012 , gastritis, fundic gland polyps   ESOPHAGOGASTRODUODENOSCOPY  05/16/2011   ESOPHAGOGASTRODUODENOSCOPY  06/14/2012   EUS  12/15/2010   Procedure: UPPER ENDOSCOPIC ULTRASOUND (EUS) LINEAR;  Surgeon: Hoyt Macleod, MD;  Location: [facility];  Service: Endoscopy;  Laterality: N/A;   EYE SURGERY Bilateral    Bi lateral cateracts and bi lateral laser   LAPAROSCOPY N/A 02/25/2013   Procedure: Cystoscopy and laparoscopy with fulguration of uterine serosa;  Surgeon: Iva Mariner de Epifania Haskell, MD;  Location: [facility];  Service: Gynecology;  Laterality: N/A;   TONSILLECTOMY      Family History  Problem Relation Age of Onset   Diabetes Mother    Stroke Father        deceased age 31   Heart disease Sister        deceased MI age 104   Diabetes Sister    Heart disease Sister    Hypertension Sister    Hyperlipidemia Sister    Diabetes Sister    Heart disease Sister    Hypertension Sister    Hyperlipidemia Sister    Heart disease Brother        deceased MI age 58   Intellectual disability Brother    Diabetes Brother    Heart disease Brother    Hypertension Brother    Hyperlipidemia Brother    Diabetes Maternal Grandmother    Hypertension Paternal Grandmother    Colon cancer Neg Hx    Esophageal cancer Neg Hx    Stomach cancer Neg Hx    Rectal cancer Neg Hx     Allergies  Allergen Reactions   Tramadol  Other (See Comments)    Dizziness     Current Outpatient Medications on File Prior to Visit  Medication Sig Dispense Refill   acetaminophen  (TYLENOL ) 325 MG tablet Take 2 tablets (650 mg total) by mouth every 6 (six) hours as needed for up to 30 doses for mild  pain or moderate pain. 30 tablet 0    amLODipine  (NORVASC ) 5 MG tablet TAKE 1 TABLET BY MOUTH DAILY 100 tablet 2   anastrozole  (ARIMIDEX ) 1 MG tablet Take 1 tablet (1 mg total) by mouth daily. 90 tablet 6   aspirin  EC 81 MG tablet Take 81 mg by mouth daily. Swallow whole.     betamethasone  valerate ointment (VALISONE ) 0.1 % Apply a small amount to affected area topically every other day. 45 g 1   Blood Glucose Monitoring Suppl (ONE TOUCH ULTRA 2) w/Device KIT USE TO CHECK BLOOD SUGAR AS DIRECTED 1 kit 0   calcium -vitamin D  (OSCAL WITH D) 500-5 MG-MCG tablet Take 1 tablet by mouth daily.     cholecalciferol  (VITAMIN D ) 1000 UNITS tablet Take 1,000 Units by mouth daily.     dicyclomine  (BENTYL ) 10 MG capsule Take 1 capsule (10 mg total) by mouth as needed for spasms. 60 capsule 0   famotidine  (PEPCID ) 40 MG tablet Take 1 tablet (40 mg total) by mouth daily. 30 tablet 4   furosemide  (LASIX ) 20 MG tablet TAKE 1 TABLET BY MOUTH DAILY AS  NEEDED 100 tablet 2   hyoscyamine  (LEVSIN SL) 0.125 MG SL tablet Place 1 tablet (0.125 mg total) under the tongue every 4 (four) hours as needed. 30 tablet 3   Lancets (ONETOUCH ULTRASOFT) lancets USE AS DIRECTED 3 TIMES  DAILY 300 each 3   losartan  (COZAAR ) 50 MG tablet TAKE 1 TABLET BY MOUTH TWICE  DAILY 200 tablet 2   metFORMIN  (GLUCOPHAGE -XR) 500 MG 24 hr tablet TAKE 1 TABLET BY MOUTH IN THE  MORNING AND 1 TABLET BY MOUTH AT BEDTIME 200 tablet 2   metoCLOPramide  (REGLAN ) 5 MG tablet TAKE 1 TABLET BY MOUTH TWICE  DAILY AS NEEDED 180 tablet 2   metoprolol  tartrate (LOPRESSOR ) 50 MG tablet TAKE 1 TABLET BY MOUTH TWICE  DAILY 200 tablet 3   Multiple Vitamin (MULTIVITAMIN ADULT PO) Take by mouth.     nystatin  (MYCOSTATIN ) 100000 UNIT/ML suspension Take 5 mLs (500,000 Units total) by mouth 4 (four) times daily. Gargle/swish in mouth for 5 minutes and then spit. 60 mL 0   ondansetron  (ZOFRAN ) 4 MG tablet Take 1 tablet (4 mg total) by mouth every 6 (six) hours as needed. 30 tablet 5   ONETOUCH ULTRA test strip  CHECK BLOOD SUGAR 3 TIMES DAILY  OR AS NEEDED 300 strip 2   pantoprazole  (PROTONIX ) 40 MG tablet Take 1 tablet (40 mg total) by mouth daily. 90 tablet 3   potassium chloride  (KLOR-CON  M) 10 MEQ tablet TAKE 1 TABLET BY MOUTH DAILY 100 tablet 2   rosuvastatin  (CRESTOR ) 20 MG tablet TAKE 1 TABLET BY MOUTH DAILY 100 tablet 1   thiamine  (VITAMIN B-1) 100 MG tablet Take 100 mg by mouth every other day.     vitamin E 180 MG (400 UNITS) capsule Take 400 Units by mouth daily.     No current facility-administered medications on file prior to visit.    BP (!) 140/60   Pulse 64   Temp 97.8 F (36.6 C)   Resp 18   Ht 5\' 1"  (1.549 m)   Wt 165 lb 3.2 oz (74.9 kg)   LMP 02/07/1992   SpO2 100%   BMI 31.21 kg/m        Objective:   Physical Exam  General Mental Status- Alert. General Appearance- Not in acute distress.   Skin General: Color- Normal Color. Moisture- Normal Moisture.  Neck Carotid Arteries- Normal  color. Moisture- Normal Moisture. No carotid bruits. No JVD.  Chest and Lung Exam Auscultation: Breath Sounds:-CTA  Cardiovascular Auscultation:Rythm- RRR Murmurs & Other Heart Sounds:Auscultation of the heart reveals- No Murmurs.  Abdomen Inspection:-Inspeection Normal. Palpation/Percussion:Note:No mass. Palpation and Percussion of the abdomen reveal- Non Tender, Non Distended + BS, no rebound or guarding.   Neurologic Cranial Nerve exam:- CN III-XII intact(No nystagmus), symmetric smile. Strength:- 5/5 equal and symmetric strength both upper and lower extremities.   Heent- no sinus pressure. Posterior pharynx normal. Left ear canal clear and tm normal. Left submandibular gland area mild enlarged and tender.    Assessment & Plan:   Patient Instructions  Lymphadenopathy with soar throat and ear pain. Previous antibiotics provided some improvement. Prefers to avoid further antibiotics. - Order ultrasound to evaluate lymphadenopathy. - Refer to ENT specialist.(You can  call ent office on thursday if they don't you by then) - Advise Tylenol  for pain management.    Dental evaluation Scheduled for dental evaluation and x-rays to rule out infections. - Follow up with dental appointment and x-ray results.  Hypertension Hypertension present. Avoids ibuprofen  due to blood pressure concerns. -continue current bp meds  Follow up date to be determined after lab and imaging review.

## 2023-06-13 ENCOUNTER — Ambulatory Visit (HOSPITAL_BASED_OUTPATIENT_CLINIC_OR_DEPARTMENT_OTHER)
Admission: RE | Admit: 2023-06-13 | Discharge: 2023-06-13 | Disposition: A | Discharge status: Home / Self Care | Source: Ambulatory Visit | Attending: Medical | Admitting: Medical

## 2023-06-13 DIAGNOSIS — R220 Localized swelling, mass and lump, head: Secondary | ICD-10-CM | POA: Diagnosis not present

## 2023-06-13 DIAGNOSIS — H9202 Otalgia, left ear: Secondary | ICD-10-CM | POA: Diagnosis not present

## 2023-06-13 DIAGNOSIS — J029 Acute pharyngitis, unspecified: Secondary | ICD-10-CM | POA: Diagnosis not present

## 2023-06-13 DIAGNOSIS — R591 Generalized enlarged lymph nodes: Secondary | ICD-10-CM | POA: Diagnosis not present

## 2023-06-13 DIAGNOSIS — R6884 Jaw pain: Secondary | ICD-10-CM | POA: Diagnosis not present

## 2023-06-14 ENCOUNTER — Encounter: Payer: Self-pay | Admitting: Medical

## 2023-06-14 ENCOUNTER — Ambulatory Visit
Admission: RE | Admit: 2023-06-14 | Discharge: 2023-06-14 | Disposition: A | Discharge status: Home / Self Care | Source: Ambulatory Visit | Attending: Family Medicine | Admitting: Family Medicine

## 2023-06-14 DIAGNOSIS — R921 Mammographic calcification found on diagnostic imaging of breast: Secondary | ICD-10-CM | POA: Diagnosis not present

## 2023-06-14 DIAGNOSIS — Z853 Personal history of malignant neoplasm of breast: Secondary | ICD-10-CM | POA: Diagnosis not present

## 2023-06-14 DIAGNOSIS — Z08 Encounter for follow-up examination after completed treatment for malignant neoplasm: Secondary | ICD-10-CM | POA: Diagnosis not present

## 2023-06-14 DIAGNOSIS — Z9889 Other specified postprocedural states: Secondary | ICD-10-CM

## 2023-06-19 ENCOUNTER — Ambulatory Visit: Admitting: Physical Therapy

## 2023-06-19 DIAGNOSIS — M25552 Pain in left hip: Secondary | ICD-10-CM

## 2023-06-19 DIAGNOSIS — M6281 Muscle weakness (generalized): Secondary | ICD-10-CM | POA: Diagnosis not present

## 2023-06-19 DIAGNOSIS — R2681 Unsteadiness on feet: Secondary | ICD-10-CM | POA: Diagnosis not present

## 2023-06-19 DIAGNOSIS — R2689 Other abnormalities of gait and mobility: Secondary | ICD-10-CM | POA: Diagnosis not present

## 2023-06-19 NOTE — Therapy (Signed)
 OUTPATIENT PHYSICAL THERAPY TREATMENT   Patient Name: [name] MRN: [id number] DOB:1940/12/12, 83 y.o., female Today's Date: 06/19/2023   END OF SESSION:  PT End of Session - 06/19/23 1152     Visit Number 12    Date for PT Re-Evaluation 07/10/23    Authorization Type [employer] Medicare    Authorization Time Period 06/05/23 - 07/03/23    Authorization - Visit Number 1    Authorization - Number of Visits 4    Progress Note Due on Visit 20    PT Start Time 1152   Pt arrived late   PT Stop Time 1234    PT Time Calculation (min) 42 min    Activity Tolerance Patient tolerated treatment well    Behavior During Therapy [facility] for tasks assessed/performed                    Past Medical History:  Diagnosis Date   Abdominal pain in female 03/18/2010   Qualifier: Diagnosis of  By: Willy Harvest MD, Kelleen Patee E    Anemia 06/08/2014   Anxiety    Arthritis    Spinal Osteoarthritis   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 09/05/2006   Qualifier: Diagnosis of  By: Georganne Kind RMA, Lucy     Diabetic peripheral neuropathy (HCC) 10/29/2013   Encounter for preventative adult health care exam with abnormal findings 09/14/2013   Esophageal reflux    Gastric polyp    Fundic Gland   Gastroparesis    Headache(784.0)    Heart murmur    Echocardiogram 2/11: EF 60-65%, mild LAE, grade 1 diastolic dysfunction, aortic valve sclerosis, mean gradient 9 mm of mercury, PASP 34   Hematuria 03/16/2016   Iron  deficiency anemia, unspecified    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   Leg swelling    bilateral   Neck pain 04/22/2015   PONV (postoperative nausea and vomiting)    pt states body temperature drops every time she has anesthesia; pt states only needs small amount of anesthesia   PSVT (paroxysmal  supraventricular tachycardia) (HCC)    Pure hypercholesterolemia    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   Renal insufficiency 06/18/2019   pt states  L kidney function very low-functions at about 20%   Stroke [facility])    tia, 2014   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   Type II or unspecified type diabetes mellitus without mention of complication, not stated as uncontrolled    Unspecified essential hypertension    Unspecified hereditary and idiopathic peripheral neuropathy 10/29/2013   Vitamin D  deficiency    Past Surgical History:  Procedure Laterality Date   BREAST BIOPSY Right 06/13/2022   US  RT BREAST BX W LOC DEV 1ST LESION IMG BX SPEC US  GUIDE 06/13/2022 [facility] MAMMOGRAPHY   BREAST LUMPECTOMY Right 07/31/2022   Procedure: RIGHT BREAST LUMPECTOMY;  Surgeon: Oza Blumenthal, MD;  Location: [facility];  Service: General;  Laterality: Right;   CHOLECYSTECTOMY  1993   COLONOSCOPY  11/11/2010   diverticulosis   DILATATION & CURRETTAGE/HYSTEROSCOPY WITH RESECTOCOPE N/A 02/25/2013   Procedure: Attempted hysteroscopy with uterine perforation;  Surgeon: Iva Mariner de Epifania Haskell, MD;  Location: [facility];  Service: Gynecology;  Laterality: N/A;   ESOPHAGOGASTRODUODENOSCOPY  08/29/2010; 09/15/2010  Carcinoid tumor less than 1 cm in July 2012 not seen in August 2012 , gastritis, fundic gland polyps   ESOPHAGOGASTRODUODENOSCOPY  05/16/2011   ESOPHAGOGASTRODUODENOSCOPY  06/14/2012   EUS  12/15/2010   Procedure: UPPER ENDOSCOPIC ULTRASOUND (EUS) LINEAR;  Surgeon: Hoyt Macleod, MD;  Location: [facility];  Service: Endoscopy;  Laterality: N/A;   EYE SURGERY Bilateral    Bi lateral cateracts and bi lateral laser   LAPAROSCOPY N/A 02/25/2013   Procedure: Cystoscopy and laparoscopy with fulguration of uterine serosa;  Surgeon: Iva Mariner de Epifania Haskell, MD;  Location: [facility];  Service: Gynecology;  Laterality: N/A;   TONSILLECTOMY     Patient Active Problem List   Diagnosis Date Noted    Precordial chest pain 12/24/2022   Carcinoma of breast upper outer quadrant, right (HCC) 07/04/2022   SVT (supraventricular tachycardia) (HCC) 06/21/2022   Left-sided weakness 04/02/2022   Unsteady gait 04/02/2022   Oral lesion 03/08/2021   Sun-damaged skin 03/08/2021   Thiamine  deficiency 11/01/2020   Trigeminal neuralgia 08/21/2020   Pedal edema 08/21/2020   Chronic left-sided back pain 03/28/2020   Nocturia 03/28/2020   Left-sided headache 03/28/2020   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   Renal insufficiency 06/18/2019   Educated about COVID-19 virus infection 05/15/2019   Atrophic vaginitis 01/20/2019   Stenosis of carotid artery 11/12/2018   Anxiety 10/13/2018   Referred otalgia of left ear 04/23/2018   Chronic throat clearing 04/23/2018   Sinusitis 10/11/2017   Headache 07/12/2017   Dizzy spells 11/21/2016   Constipation 11/21/2016   Nonrheumatic aortic valve stenosis 05/23/2016   Aortic atherosclerosis (HCC) 05/23/2016   Hematuria 03/16/2016   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   Pain of upper abdomen 06/06/2015   Neck pain 04/22/2015   Ear pain 02/28/2015   Abnormal urine 11/21/2014   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   Anemia 06/08/2014   RLS (restless legs syndrome) 11/23/2013   Diabetic peripheral neuropathy (HCC) 10/29/2013   Left-sided thoracic back pain 10/06/2013   Encounter for preventative adult health care exam with abnormal findings 09/14/2013   Iron  deficiency anemia    Status post laparoscopy 02/25/2013   Hyponatremia 01/09/2013   GERD (gastroesophageal reflux disease) 01/09/2013   Amaurosis fugax of left eye 10/16/2012   Low back pain 06/03/2012   Vitamin D  deficiency 03/11/2012   Bilateral hand pain 10/20/2011   Encounter for long-term (current) use of other medications 10/20/2011   IBS (irritable bowel syndrome) 08/14/2011   TIA (transient ischemic attack) 02/10/2011   Abnormal brain CT 01/19/2011   Allergic rhinitis 10/01/2010    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   Preventative health care 07/15/2010   FUNDIC GLAND POLYPS OF STOMACH 03/18/2010   Abdominal pain in female 03/18/2010   Left hip pain 03/17/2009   SYSTOLIC MURMUR 03/02/2009   Paroxysmal supraventricular tachycardia (HCC) 01/12/2009   PLANTAR FASCIITIS 06/08/2008   CHEST PAIN 05/18/2008   Gastroparesis 12/18/2007   HYPERCHOLESTEROLEMIA 06/11/2007   Type 2 diabetes mellitus with obesity (HCC) 09/05/2006   Essential hypertension 09/05/2006    PCP: Neda Balk, MD   REFERRING PROVIDER: Neda Balk, MD   REFERRING DIAG:  M79.10 (ICD-10-CM) - Myalgia  R26.81 (ICD-10-CM) - Unsteady gait  R53.1 (ICD-10-CM) - Weakness   THERAPY DIAG:  Muscle weakness (generalized)  Other abnormalities of gait and mobility  Unsteadiness on feet  Pain in left hip  RATIONALE FOR EVALUATION AND TREATMENT: Rehabilitation  ONSET DATE: Worsening over the past year,  especially since breast cancer diagnosis and May 2024  NEXT MD VISIT: 07/24/2023   SUBJECTIVE:                                                                                                                                                                                                         SUBJECTIVE STATEMENT: When asked about pain, Tiersa reports "a little bit of everything", with pain rating of 4/10.  EVAL: Pt reports her weakness and pains seems to be exacerbated by recent meds she has had to take. She notes difficulty climbing stairs and does not feel safe using a step stool to reach high things.  She feels like she has no energy.  She reports chronic belly pain, maybe from meds vs diverticulitis.  L hip pain has been an issue for several months but patient has been told there is no "bony" reason for her pain (x-rays negative).  PAIN: Are you having pain? No and Yes: NPRS scale: 4/10  Pain location: B hip Pain description: dull, sharp when more intense  Aggravating  factors: lying on side to sleep (worse when lying on L than R), first moving in the morning Relieving factors: walking, Tylenol    Are you having pain? Yes: NPRS scale: 4/10 Pain location: L>R upper shoulders Pain description: pinching Aggravating factors: uncertain Relieving factors: keep moving  PERTINENT HISTORY:  Extensive PMH including chronic back and neck pain, OA, osteoporosis, DM, HTN, h/o TIA, diabetic peripheral neuropathy, PVD, headaches, sleep dysfunction, anxiety, R breast cancer s/p lumpectomy 07/31/22, SVT (refer to above problem list and past medical/surgical history for full PMH)   PRECAUTIONS: Fall  RED FLAGS: None  WEIGHT BEARING RESTRICTIONS: No  FALLS:  Has patient fallen in last 6 months? No - admits to a few close calls due to dizziness/lightheadness  LIVING ENVIRONMENT: Lives with: lives alone  Lives in: House/apartment Stairs: No Has following equipment at home: Single point cane, Environmental consultant - 4 wheeled, shower chair, and Grab bars  OCCUPATION: Retired  PLOF: Independent, Needs assistance with homemaking, Leisure: read and play card games on her phone, traveling, and housekeeping/cleaning service ~1x/month   PATIENT GOALS: "To be able to walk steady and feel comfortable going up stairs so I can visit my niece and nephew. To be able to carry groceries."   OBJECTIVE: (objective measures completed at initial evaluation unless otherwise dated)  DIAGNOSTIC FINDINGS:  01/22/23 & 10/05/22 - DH Left hip (2 several months of L hip pain) IMPRESSION: Negative.  COGNITION: Overall cognitive status: Within functional limits for tasks assessed   SENSATION:  Numbness and tingling in hands and feet from DM and cervical radiculopathy  POSTURE:  rounded shoulders, forward head, increased thoracic kyphosis, and flexed trunk   PALPATION: Increased TTP over L TFL, hip flexors and abductors  UPPER EXTREMITY MMT:  MMT Right eval Left eval  Shoulder flexion 4- 4-   Shoulder extension 3+ 3+  Shoulder abduction 4- 4-  Shoulder adduction    Shoulder internal rotation 4- 4-  Shoulder external rotation 3+ 3+  Middle trapezius    Lower trapezius     (Blank rows = not tested)  MUSCLE LENGTH: Hamstrings: mod tight B ITB: mod tight B  Piriformis: severe tight B Hip flexors: mild/mod tight L>R  Quads: mild/mod tight L>R  Heelcord: mod tight B  LOWER EXTREMITY ROM:    Mildly limited primarily due to muscle tightness  LOWER EXTREMITY MMT:  (tested in sitting on eval)  MMT Right eval Left eval R 05/29/23 L 05/29/23  Hip flexion 3+ 3+ 4- 4-  Hip extension 3- 3- 4- 4-  Hip abduction 3+ 3+ 4 4  Hip adduction 3+ 3+ 4 4  Hip internal rotation 3- 3- 4 4-  Hip external rotation 3- 3- 4 4  Knee flexion 4- 4- 4+ 4  Knee extension 4- 4 4+ 4+  Ankle dorsiflexion 3+ 4- 4 4  Ankle plantarflexion      Ankle inversion      Ankle eversion      (Blank rows = not tested)  BED MOBILITY:  Sit to supine Min A Supine to sit Min A Rolling to Right SBA  TRANSFERS: Assistive device utilized: None  Sit to stand: Complete Independence Stand to sit: Complete Independence Chair to chair: Modified independence Floor: NT  GAIT: Distance walked: Clinic distances Assistive device utilized: None Level of assistance: Complete Independence and SBA Gait pattern: decreased stride length and trunk flexed Comments: Decreased gait speed  RAMP: Level of Assistance: NT Assistive device utilized:   Ramp Comments:   CURB:  Level of Assistance: NT Assistive device utilized:   Curb Comments:   STAIRS: Level of Assistance: SBA Stair Negotiation Technique: Step to Pattern, Sideways (descent), Forwards (ascent) with Single Rail on Right Number of Stairs: 14  Height of Stairs: 7"  Comments: forward for ascent, sideways leading with R foot for descent    FUNCTIONAL TESTS: (TBA next visit) 5 times sit to stand: 30.43 sec; >15 sec indicates a risk for recurrent falls   Timed up and go (TUG): 16.03 sec; >13.5 sec indicates a high risk for falls  10 meter walk test: 13.91 sec ; Gait speed = 2.36 ft/sec; >/=2.62 ft/sec is necessary to be considered a Materials engineer Scale: 44/56; Scores of 37-45 indicate significant risk for falls (>80%)  Dynamic Gait Index: 15/24; Scores of 19 or less are predictive of falls in older community living adults.    05/03/23   TUG: 13.5 sec  05/15/23 = 11.78 sec Gait speed = 2.78 ft/sec  05/29/23 5xSTS = 25.78 sec; ; >15 sec indicates a risk for recurrent falls Berg = 50/56; Scores of 46-51 indicate moderate (>50%) risk for falls DGI = 18/24; Scores of 19 or less are predicitve of falls in older community living adults.  PATIENT SURVEYS:  ABC scale 580 / 1600 = 36.3 %, scores of <50% indicating a low level of physical functioning and scores of <67% indicating a risk for falls in patients with vestibular disorders 05/29/23: 770 / 1600 = 48.1 %  TODAY'S TREATMENT:   06/19/23 THERAPEUTIC EXERCISE: To improve strength and endurance.  Demonstration, verbal and tactile cues throughout for technique.  UBE - L1.0 x 6 min (3' each fwd & back)  THERAPEUTIC ACTIVITIES: To improve functional performance.  Demonstration, verbal and tactile cues throughout for technique.  6" forward step up + step down (B feet on step) w/ counter support x 10 B 6" lateral step up w/ counter support x 10 B 6" step & over (1 foot on step) w/ counter support x 10 B 6" cross-over lateral step up w/ counter support x 10 B   06/05/23 THERAPEUTIC EXERCISE: To improve strength and endurance.  Demonstration, verbal and tactile cues throughout for technique. NuStep L2 x 6 min LS stretch x 30" B  NEUROMUSCULAR RE-EDUCATION: To improve coordination, kinesthesia, posture, and proprioception. Seated horizontal ABD w/ YTB - 2 x 10 Standing scap retraction + shoulder row w/ YTB x 10 Standing scap retraction + ext w/ YTB x 10 "V" Wall  slides x 10 "V" Wall slides w/ YTB x 10 Wall Clock Reaches w/ YTB x 4 B 6 in forward step up w/ counter support x 10 B 6 in lateral step down w/ counter support x 10 B   05/29/23 - Recert THERAPEUTIC EXERCISE: To improve strength and endurance.  Demonstration, verbal and tactile cues throughout for technique. NuStep - L5 x 6 min  VS: BP = 130/62 HR = 66 SpO2 = 98%  THERAPEUTIC ACTIVITIES: To improve functional performance.  Demonstration, verbal and tactile cues throughout for technique. LE MMT Goal assessment  PHYSICAL PERFORMANCE TEST or MEASUREMENT: 5xSTS = 25.78 sec; ; >15 sec indicates a risk for recurrent falls Berg = 50/56; Scores of 46-51 indicate moderate (>50%) risk for falls DGI = 18/24; Scores of 19 or less are predicitve of falls in older community living adults.   Berg Balance Test   Sit to Stand Able to stand without using hands and stabilize independently    Standing Unsupported Able to stand safely 2 minutes    Sitting with Back Unsupported but Feet Supported on Floor or Stool Able to sit safely and securely 2 minutes    Stand to Sit Sits safely with minimal use of hands    Transfers Able to transfer safely, minor use of hands    Standing Unsupported with Eyes Closed Able to stand 10 seconds safely    Standing Unsupported with Feet Together Able to place feet together independently and stand 1 minute safely    From Standing, Reach Forward with Outstretched Arm Can reach confidently >25 cm (10")    From Standing Position, Pick up Object from Floor Able to pick up shoe safely and easily    From Standing Position, Turn to Look Behind Over each Shoulder Looks behind one side only/other side shows less weight shift    Turn 360 Degrees Able to turn 360 degrees safely one side only in 4 seconds or less    Standing Unsupported, Alternately Place Feet on Step/Stool Able to stand independently and complete 8 steps >20 seconds    Standing Unsupported, One Foot in Front Able  to plae foot ahead of the other independently and hold 30 seconds    Standing on One Leg Able to lift leg independently and hold equal to or more than 3 seconds    Total Score 50    Berg comment: 46-51 moderate (>50%)      Dynamic Gait Index   Level Surface Normal  Change in Gait Speed Moderate Impairment    Gait with Horizontal Head Turns Normal    Gait with Vertical Head Turns Normal    Gait and Pivot Turn Mild Impairment    Step Over Obstacle Mild Impairment    Step Around Obstacles Normal    Steps Moderate Impairment    Total Score 18    DGI comment: Scores of 19 or less are predicitve of falls in older community living adults.        PATIENT EDUCATION:  Education details: continue with current HEP and work toward completing [place] 3x/wk  Person educated: Patient Education method: Explanation Education comprehension: verbalized understanding  HOME EXERCISE PROGRAM: Access Code: [email or web address] URL: [email or web address] Date: 04/24/2023 Prepared by: Felecia Hopper  Patient Education - Check for Safety - [facility] Program   ASSESSMENT:  CLINICAL IMPRESSION: Bethan reports generalized pain at 4/10 level. We revisited curb/stair negotiation with pt reporting lack of confidence and demonstrating shakiness w/o UE support.  Focused on functional strengthening with progression of complexity to challenge balance with step/curb negotiation utilizing UE support for safety.  Daylynn will benefit from continued skilled PT to address ongoing strength and balance deficits to improve mobility and activity tolerance with decreased pain interference and decreased risk for falls.  OBJECTIVE IMPAIRMENTS: Abnormal gait, decreased activity tolerance, decreased balance, decreased coordination, decreased endurance, decreased knowledge of condition, decreased knowledge of use of DME, decreased mobility, difficulty walking, decreased ROM, decreased strength, decreased safety awareness,  dizziness, increased fascial restrictions, impaired perceived functional ability, increased muscle spasms, impaired flexibility, impaired sensation, impaired UE functional use, improper body mechanics, postural dysfunction, and pain.   ACTIVITY LIMITATIONS: carrying, lifting, bending, standing, squatting, sleeping, stairs, transfers, bed mobility, and locomotion level  PARTICIPATION LIMITATIONS: meal prep, cleaning, laundry, driving, shopping, community activity, and church  PERSONAL FACTORS: Age, Fitness, Past/current experiences, Social background, Time since onset of injury/illness/exacerbation, and 3+ comorbidities: Extensive PMH including chronic back and neck pain, OA, osteoporosis, DM, HTN, h/o TIA, diabetic peripheral neuropathy, PVD, headaches, sleep dysfunction, anxiety, R breast cancer s/p lumpectomy 07/31/22, SVT (refer to above problem list and past medical/surgical history for full PMH)  are also affecting patient's functional outcome.   REHAB POTENTIAL: Good  CLINICAL DECISION MAKING: Unstable/unpredictable  EVALUATION COMPLEXITY: High   GOALS: Goals reviewed with patient? Yes  SHORT TERM GOALS: Target date: 05/01/2023  Patient will be independent with initial HEP to improve outcomes and carryover.  Baseline:  Goal status: MET - 05/01/23 - pt denies any questions or concerns with [place] fall prevention program  2.  Patient will be educated on strategies to decrease risk of falls.  Baseline:  Goal status: MET - 04/24/23  3.  Patient will demonstrate decreased TUG time to </= 13.5 sec to decrease risk for falls with transitional mobility. Baseline:  Goal status: MET - 13.5 sec - 05/03/23  LONG TERM GOALS: Target date: 05/29/2023, extended to 07/10/2023  Patient will be independent with advanced/ongoing HEP to facilitate ability to maintain/progress functional gains from skilled physical therapy services. Baseline:  Goal status: IN PROGRESS - 05/29/23 - pt reports completing the  [facility] Prevention program ~2x/wk currently  2.  Patient will be able to ambulate 600' with or w/o LRAD on variable surfaces with good safety to access community.  Baseline:  Goal status: IN PROGRESS - 05/29/23 - no issues on indoors surfaces, deferred outdoor assessment due to weather  3.  Patient will be able to step up/down curb safely with or w/o LRAD for safety  with community ambulation.  Baseline:  Goal status: IN PROGRESS - 06/19/23 - shaky with 6" curb ascent/descent w/o UE support (reaching for PT)  4.  Patient will demonstrate improved B LE strength to >/= 4/5 for improved stability and ease of mobility. Baseline: Refer to above LE MMT table Goal status: PARTIALLY MET - 05/29/23 - gains noted in all muscle groups, but B hip flexion, extension and L hip IR still 4-/5  5.  Patient will improve 5x STS time to </= 15 seconds for improved efficiency and safety with transfers. Baseline: 30.43 sec (04/09/23) Goal status: IN PROGRESS - 05/29/23 - 25.78 sec  6.  Patient will demonstrate gait speed of >/= 2.62 ft/sec (0.8 m/s) to be a safe community ambulator with decreased risk for recurrent falls.  Baseline: 2.36 ft/sec (04/09/23) Goal status: MET - 05/15/23 - 2.78 ft/sec  7.  Patient will improve Berg score by at least 8 points to improve safety and stability with ADLs in standing and reduce risk for falls.  Baseline: 44/56 (04/09/23) Goal status: IN PROGRESS - 05/29/23 - 50/56  8.  Patient will demonstrate at least 19/24 on DGI or 19/30 on FGA to improve gait stability and reduce risk for falls. Baseline: DGI = 15/24 (04/09/23) Goal status: IN PROGRESS - 05/29/23 - DGI = 18/24  9. Patient will report >/= 50% on ABC scale to demonstrate improved balance confidence with functional mobility and gait. Baseline: 580 / 1600 = 36.3 % Goal status: IN PROGRESS - 05/29/23 - 770 / 1600 = 48.1 %   PLAN:  PT FREQUENCY: 2x/week  PT DURATION: 8 weeks  PLANNED INTERVENTIONS: 97164- PT Re-evaluation,  97110-Therapeutic exercises, 97530- Therapeutic activity, 97112- Neuromuscular re-education, 97535- Self Care, 62952- Manual therapy, [phone]- Gait training, [phone]- Canalith repositioning, [id number]- Electrical stimulation (unattended), 97035- Ultrasound, 66440- Ionotophoresis 4mg /ml Dexamethasone , Patient/Family education, Balance training, Stair training, Taping, Dry Needling, Joint mobilization, Spinal mobilization, Vestibular training, Cryotherapy, Moist heat, and 97750- Physical performance test or measurement  PLAN FOR NEXT SESSION: Review [place] fall prevention program PRN; LE strengthening - functional emphasis for stair negotiation; progress balance training   Francisco Irving, PT 06/19/2023, 1:11 PM    Date of referral: 01/16/2023 Referring provider: Neda Balk, MD Referring diagnosis?  M79.10 (ICD-10-CM) - Myalgia  R26.81 (ICD-10-CM) - Unsteady gait  R53.1 (ICD-10-CM) - Weakness  Treatment diagnosis? (if different than referring diagnosis)  Muscle weakness (generalized)  Other abnormalities of gait and mobility  Unsteadiness on feet  Pain in left hip  What was this (referring dx) caused by? Ongoing Issue and Other: Sequelae of treatments for breast cancer  Nature of Condition: Chronic (continuous duration > 3 months)   Laterality: Lt  Current Functional Measure Score: Other ABC scale = 770 / 1600 = 48.1 % (05/29/23);  580 / 1600 = 36.3 % as of eval on 04/24/23  Objective measurements identify impairments when they are compared to normal values, the uninvolved extremity, and prior level of function.  [x]  Yes  []  No  Objective assessment of functional ability: Severe functional limitations   Briefly describe symptoms: Patient presents with physical impairments of L anterior lateral hip pain, impaired activity tolerance, global UE/LE weakness, dizziness, impaired standing balance, impaired ambulation, inability to navigate stairs and decreased safety awareness impacting safe  and independent functional mobility.  Gains noted in LE strength and across all standardized balance tests since start of PT but remain shy of PT goals.  How did symptoms start: Gradual onset exacerbated by treatment for  breast cancer starting May 2024  Average pain intensity:  Last 24 hours: 7/10  Past week: 8/10  How often does the pt experience symptoms? Frequently  How much have the symptoms interfered with usual daily activities? Quite a bit  How has condition changed since care began at this facility? Better  In general, how is the patients overall health? Fair  Onset date: Chronic   BACK PAIN (STarT Back Screening Tool) - (When applicable): N/A  Has your back pain spread down your leg(s) at sometime in the last 2 weeks? []  Yes   []  No Have you had pain in the shoulder or neck at sometime in the past 2 weeks? []  Yes   []  No Have you only walked short distances because of your back pain? []  Yes   []  No In the past 2 weeks, have you dressed more slowly than usual because of your back pain? []  Yes   []  No Do you think it is not really safe for person with a condition like yours to be physically active? []  Yes   []  No Have worrying thoughts been going through your mind a lot of the time? []  Yes   []  No Do you feel that your back pain is terrible and it is never going to get any better? []  Yes   []  No In general, have you stopped enjoying all the things you usually enjoy? []  Yes   []  No Overall, how bothersome has your back pain been in the last 2 weeks? []  Not at all   []  Slightly     []  Moderate   []  Very much     []  Extremely

## 2023-06-21 ENCOUNTER — Ambulatory Visit: Admitting: Physical Therapy

## 2023-06-26 ENCOUNTER — Ambulatory Visit (INDEPENDENT_AMBULATORY_CARE_PROVIDER_SITE_OTHER): Admitting: Otolaryngology

## 2023-06-26 ENCOUNTER — Encounter (INDEPENDENT_AMBULATORY_CARE_PROVIDER_SITE_OTHER): Payer: Self-pay | Admitting: Otolaryngology

## 2023-06-26 ENCOUNTER — Encounter: Admitting: Physical Therapy

## 2023-06-26 VITALS — BP 148/82 | HR 71 | Ht 61.0 in | Wt 163.0 lb

## 2023-06-26 DIAGNOSIS — J029 Acute pharyngitis, unspecified: Secondary | ICD-10-CM | POA: Diagnosis not present

## 2023-06-26 DIAGNOSIS — H938X2 Other specified disorders of left ear: Secondary | ICD-10-CM

## 2023-06-26 DIAGNOSIS — M542 Cervicalgia: Secondary | ICD-10-CM

## 2023-06-26 NOTE — Progress Notes (Signed)
 Dear Dr. Lari Pleva, Here is my assessment for our mutual patient, [name]. Thank you for allowing me the opportunity to care for your patient. Please do not hesitate to contact me should you have any other questions. Sincerely, Dr. Milon Aloe  Otolaryngology Clinic Note Referring provider: Dr. Lari Pleva HPI:  [name] is a 82 y.o. female kindly referred by Dr. Saguier for evaluation of left ear discomfort and throat complaints.  Initial visit (06/2023): Patient reports: about two months ago, she started to have some throat pain ("felt like strep throat") and some left ear pain and fullness and some bilateral neck tenderness as well as some trouble with swallowing solids. No antecedent event including URI. Has had antibiotics and steroids, and slowly improved, but still having some ear discomfort and fullness. Throat is less sore now and swallowing is without issue. Patient currently otherwise denies: - dysphagia, odynophagia, cough, unintentional weight loss - changes in voice, shortness of breath, hemoptysis, tobacco - neck masses She does have noted chronic left ear discomfort. Does have intermittent TMJ symptoms. Denies bruxism No otologic symptoms including drainage, tinnitus  H&N Surgery: no Personal or FHx of bleeding dz or anesthesia difficulty: no  GLP-1: no AP/AC: ASA 81  Independent Review of Additional Tests or Records:  Gaylin Ke Saguier ( 06/05/2023 and 06/12/2023): pharyngitis; concern for strep and ear discomfort. "Sensation of something pumping or weight" Dx: Lymphadnopathy, Rx: US  and ref to ENT; dental eval Trenton Frock (05/15/2023): Mouth burning; Rx: Nystatin  OM: flonase and claritin Dawna Etienne (05/21/2023): URI sx x10d, no improvement; Dx: Strep throat; Rx: Amox, prednisone   Dr. Sulema Endo (04/23/2018): chronic left ear discomfort with throat awareness and clearing; CT showing no adenopathy or masses; Dx: referred pain  CBC w/Diff 06/05/2023: WBC 8.2, Plt  216, Hgb 12.5 GAS Cx 06/05/2023: neg, COVID and Flu neg US  (06/13/2023) independently interpreted, agree with read   CT Face 2020: No obvious pathologically enlarged LNs PMH/Meds/All/SocHx/FamHx/ROS:   Past Medical History:  Diagnosis Date   Abdominal pain in female 03/18/2010   Qualifier: Diagnosis of  By: Willy Harvest MD, Kelleen Patee E    Anemia 06/08/2014   Anxiety    Arthritis    Spinal Osteoarthritis   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 09/05/2006   Qualifier: Diagnosis of  By: Georganne Kind RMA, Lucy     Diabetic peripheral neuropathy (HCC) 10/29/2013   Encounter for preventative adult health care exam with abnormal findings 09/14/2013   Esophageal reflux    Gastric polyp    Fundic Gland   Gastroparesis    Headache(784.0)    Heart murmur    Echocardiogram 2/11: EF 60-65%, mild LAE, grade 1 diastolic dysfunction, aortic valve sclerosis, mean gradient 9 mm of mercury, PASP 34   Hematuria 03/16/2016   Iron  deficiency anemia, unspecified    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   Leg swelling    bilateral   Neck pain 04/22/2015   PONV (postoperative nausea and vomiting)    pt states body temperature drops every time she has anesthesia; pt states only needs small amount of anesthesia   PSVT (paroxysmal supraventricular tachycardia) (HCC)    Pure hypercholesterolemia    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   Renal insufficiency 06/18/2019   pt states  L kidney function  very low-functions at about 20%   Stroke [facility])    tia, 2014   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   Type II or unspecified type diabetes mellitus without mention of complication, not stated as uncontrolled    Unspecified essential hypertension    Unspecified hereditary and idiopathic peripheral neuropathy 10/29/2013   Vitamin D  deficiency      Past  Surgical History:  Procedure Laterality Date   BREAST BIOPSY Right 06/13/2022   US  RT BREAST BX W LOC DEV 1ST LESION IMG BX SPEC US  GUIDE 06/13/2022 [facility] MAMMOGRAPHY   BREAST LUMPECTOMY Right 07/31/2022   Procedure: RIGHT BREAST LUMPECTOMY;  Surgeon: Oza Blumenthal, MD;  Location: [facility];  Service: General;  Laterality: Right;   CHOLECYSTECTOMY  1993   COLONOSCOPY  11/11/2010   diverticulosis   DILATATION & CURRETTAGE/HYSTEROSCOPY WITH RESECTOCOPE N/A 02/25/2013   Procedure: Attempted hysteroscopy with uterine perforation;  Surgeon: Iva Mariner de Epifania Haskell, MD;  Location: [facility];  Service: Gynecology;  Laterality: N/A;   ESOPHAGOGASTRODUODENOSCOPY  08/29/2010; 09/15/2010   Carcinoid tumor less than 1 cm in July 2012 not seen in August 2012 , gastritis, fundic gland polyps   ESOPHAGOGASTRODUODENOSCOPY  05/16/2011   ESOPHAGOGASTRODUODENOSCOPY  06/14/2012   EUS  12/15/2010   Procedure: UPPER ENDOSCOPIC ULTRASOUND (EUS) LINEAR;  Surgeon: Hoyt Macleod, MD;  Location: [facility];  Service: Endoscopy;  Laterality: N/A;   EYE SURGERY Bilateral    Bi lateral cateracts and bi lateral laser   LAPAROSCOPY N/A 02/25/2013   Procedure: Cystoscopy and laparoscopy with fulguration of uterine serosa;  Surgeon: Iva Mariner de Epifania Haskell, MD;  Location: [facility];  Service: Gynecology;  Laterality: N/A;   TONSILLECTOMY      Family History  Problem Relation Age of Onset   Diabetes Mother    Stroke Father        deceased age 47   Heart disease Sister        deceased MI age 27   Diabetes Sister    Heart disease Sister    Hypertension Sister    Hyperlipidemia Sister    Diabetes Sister    Heart disease Sister    Hypertension Sister    Hyperlipidemia Sister    Heart disease Brother        deceased MI age 53   Intellectual disability Brother    Diabetes Brother    Heart disease Brother    Hypertension Brother    Hyperlipidemia Brother    Diabetes Maternal  Grandmother    Hypertension Paternal Grandmother    Colon cancer Neg Hx    Esophageal cancer Neg Hx    Stomach cancer Neg Hx    Rectal cancer Neg Hx      Social Connections: Moderately Isolated (09/07/2022)   Social Connection and Isolation Panel [NHANES]    Frequency of Communication with Friends and Family: More than three times a week    Frequency of Social Gatherings with Friends and Family: Once a week    Attends Religious Services: More than 4 times per year    Active Member of [employer] or Organizations: No    Attends Banker Meetings: Never    Marital Status: Widowed      Current Outpatient Medications:    acetaminophen  (TYLENOL ) 325 MG tablet, Take 2 tablets (650 mg total) by mouth every 6 (six) hours as needed for up to 30 doses for mild pain or moderate pain., Disp: 30 tablet, Rfl: 0   amLODipine  (  NORVASC ) 5 MG tablet, TAKE 1 TABLET BY MOUTH DAILY, Disp: 100 tablet, Rfl: 2   aspirin  EC 81 MG tablet, Take 81 mg by mouth daily. Swallow whole., Disp: , Rfl:    betamethasone  valerate ointment (VALISONE ) 0.1 %, Apply a small amount to affected area topically every other day. (Patient not taking: Reported on 06/29/2023), Disp: 45 g, Rfl: 1   Blood Glucose Monitoring Suppl (ONE TOUCH ULTRA 2) w/Device KIT, USE TO CHECK BLOOD SUGAR AS DIRECTED, Disp: 1 kit, Rfl: 0   calcium -vitamin D  (OSCAL WITH D) 500-5 MG-MCG tablet, Take 1 tablet by mouth daily., Disp: , Rfl:    cholecalciferol  (VITAMIN D ) 1000 UNITS tablet, Take 1,000 Units by mouth daily., Disp: , Rfl:    dicyclomine  (BENTYL ) 10 MG capsule, Take 1 capsule (10 mg total) by mouth as needed for spasms., Disp: 60 capsule, Rfl: 0   famotidine  (PEPCID ) 40 MG tablet, Take 1 tablet (40 mg total) by mouth daily. (Patient not taking: Reported on 06/29/2023), Disp: 30 tablet, Rfl: 4   furosemide  (LASIX ) 20 MG tablet, TAKE 1 TABLET BY MOUTH DAILY AS  NEEDED, Disp: 100 tablet, Rfl: 2   hyoscyamine  (LEVSIN SL) 0.125 MG SL tablet, Place 1  tablet (0.125 mg total) under the tongue every 4 (four) hours as needed. (Patient not taking: Reported on 06/29/2023), Disp: 30 tablet, Rfl: 3   Lancets (ONETOUCH ULTRASOFT) lancets, USE AS DIRECTED 3 TIMES  DAILY, Disp: 300 each, Rfl: 3   losartan  (COZAAR ) 50 MG tablet, TAKE 1 TABLET BY MOUTH TWICE  DAILY, Disp: 200 tablet, Rfl: 2   metFORMIN  (GLUCOPHAGE -XR) 500 MG 24 hr tablet, TAKE 1 TABLET BY MOUTH IN THE  MORNING AND 1 TABLET BY MOUTH AT BEDTIME, Disp: 200 tablet, Rfl: 2   metoCLOPramide  (REGLAN ) 5 MG tablet, TAKE 1 TABLET BY MOUTH TWICE  DAILY AS NEEDED, Disp: 180 tablet, Rfl: 2   metoprolol  tartrate (LOPRESSOR ) 50 MG tablet, TAKE 1 TABLET BY MOUTH TWICE  DAILY, Disp: 200 tablet, Rfl: 3   ondansetron  (ZOFRAN ) 4 MG tablet, Take 1 tablet (4 mg total) by mouth every 6 (six) hours as needed., Disp: 30 tablet, Rfl: 5   ONETOUCH ULTRA test strip, CHECK BLOOD SUGAR 3 TIMES DAILY  OR AS NEEDED, Disp: 300 strip, Rfl: 2   pantoprazole  (PROTONIX ) 40 MG tablet, Take 1 tablet (40 mg total) by mouth daily., Disp: 90 tablet, Rfl: 3   potassium chloride  (KLOR-CON  M) 10 MEQ tablet, TAKE 1 TABLET BY MOUTH DAILY, Disp: 100 tablet, Rfl: 2   rosuvastatin  (CRESTOR ) 20 MG tablet, TAKE 1 TABLET BY MOUTH DAILY, Disp: 100 tablet, Rfl: 1   thiamine  (VITAMIN B-1) 100 MG tablet, Take 100 mg by mouth every other day., Disp: , Rfl:    vitamin E 180 MG (400 UNITS) capsule, Take 400 Units by mouth daily., Disp: , Rfl:    Physical Exam:   BP (!) 148/82 (BP Location: Left Arm, Patient Position: Sitting, Cuff Size: Normal)   Pulse 71   Ht 5\' 1"  (1.549 m)   Wt 163 lb (73.9 kg)   LMP 02/07/1992   SpO2 97%   BMI 30.80 kg/m   Salient findings:  CN II-XII intact Bilateral EAC clear and TM intact with well pneumatized middle ear spaces Anterior rhinoscopy: Septum intact, relatively midline; bilateral inferior turbinates without significant hypertrophy No lesions of oral cavity/oropharynx; no palpable tongue base masses No  obviously palpable neck masses/lymphadenopathy/thyromegaly - unable to appreciate any obvious LAD; the left sided LN that  she points to apperas to be ptotic left submandibular gland No respiratory distress or stridor; TFL was indicated to better evaluate the proximal airway, given the patient's history and exam findings, and is detailed below.   Seprately Identifiable Procedures:  Prior to initiating any procedures, risks/benefits/alternatives were explained to the patient and verbal consent obtained. Procedure Note Pre-procedure diagnosis: sore throat, neck lymphadenopathy concern, rule out structural lesions Post-procedure diagnosis: Same Procedure: Transnasal Fiberoptic Laryngoscopy, CPT 31575 - Mod 25 Indication: see above Complications: None apparent EBL: 0 mL  The procedure was undertaken to further evaluate the patient's complaint above, with mirror exam inadequate for appropriate examination due to gag reflex and poor patient tolerance  Procedure:  Patient was identified as correct patient. Verbal consent was obtained. The nose was sprayed with oxymetazoline and 4% lidocaine . The The flexible laryngoscope was passed through the nose to view the nasal cavity, pharynx (oropharynx, hypopharynx) and larynx.  The larynx was examined at rest and during multiple phonatory tasks. Documentation was obtained and reviewed with patient. The scope was removed. The patient tolerated the procedure well.  Findings: The nasal cavity and nasopharynx did not reveal any masses or lesions, mucosa appeared to be without obvious lesions. The tongue base, pharyngeal walls, piriform sinuses, vallecula, epiglottis and postcricoid region are normal in appearance without noted masses or foreign bodies. The visualized portion of the subglottis and proximal trachea is widely patent. The vocal folds are mobile bilaterally. There are no lesions on the free edge of the vocal folds nor elsewhere in the larynx worrisome for  malignancy.      Electronically signed by: Evelina Hippo, MD 07/11/2023 7:13 AM   Impression & Plans:  Melicia Esqueda is a 83 y.o. female with:  1. Sore throat   2. Sensation of fullness in left ear   3. Tenderness of neck    Noted multiple issues and we had a long discussion regarding these. US  does not appear to show pathologic nodes or significantly large nodes and her symptoms are improving. TFL overal reassuring. As such, d/w pt options re: workup including if she wished - CT, but decl given improvement Suspect at this point that this may be been referred v/s post viral. Neck lumps she feels appears to be a ptotic SMG, for which I'd recommend observation D/w pt f/u, and she opted PRN - advised to call should sx persist or new sx develop  See below regarding exact medications prescribed this encounter including dosages and route: No orders of the defined types were placed in this encounter.     Thank you for allowing me the opportunity to care for your patient. Please do not hesitate to contact me should you have any other questions.  Sincerely, Milon Aloe, MD Otolaryngologist (ENT), [facility] Phone: [phone] Fax: [phone]  07/11/2023, 7:13 AM   MDM:  Level 4 Complexity/Problems addressed: mod - multiple chronic Data complexity: high - independent review of notes, labs; independent imaging interpretation - Morbidity: low currently  - Prescription Drug prescribed or managed: no

## 2023-06-28 ENCOUNTER — Ambulatory Visit: Admitting: Physical Therapy

## 2023-06-29 ENCOUNTER — Inpatient Hospital Stay: Admitting: Hematology & Oncology

## 2023-06-29 ENCOUNTER — Encounter: Payer: Self-pay | Admitting: *Deleted

## 2023-06-29 ENCOUNTER — Encounter: Payer: Self-pay | Admitting: Hematology & Oncology

## 2023-06-29 ENCOUNTER — Inpatient Hospital Stay: Attending: Hematology & Oncology

## 2023-06-29 ENCOUNTER — Ambulatory Visit: Payer: Self-pay | Admitting: Hematology & Oncology

## 2023-06-29 VITALS — BP 130/48 | HR 66 | Temp 98.8°F | Resp 20 | Ht 61.0 in | Wt 165.0 lb

## 2023-06-29 DIAGNOSIS — K909 Intestinal malabsorption, unspecified: Secondary | ICD-10-CM

## 2023-06-29 DIAGNOSIS — C50411 Malignant neoplasm of upper-outer quadrant of right female breast: Secondary | ICD-10-CM | POA: Insufficient documentation

## 2023-06-29 DIAGNOSIS — Z1721 Progesterone receptor positive status: Secondary | ICD-10-CM | POA: Insufficient documentation

## 2023-06-29 DIAGNOSIS — Z170 Estrogen receptor positive status [ER+]: Secondary | ICD-10-CM | POA: Diagnosis not present

## 2023-06-29 DIAGNOSIS — Z1732 Human epidermal growth factor receptor 2 negative status: Secondary | ICD-10-CM | POA: Diagnosis not present

## 2023-06-29 DIAGNOSIS — Z79811 Long term (current) use of aromatase inhibitors: Secondary | ICD-10-CM | POA: Diagnosis not present

## 2023-06-29 DIAGNOSIS — D509 Iron deficiency anemia, unspecified: Secondary | ICD-10-CM | POA: Insufficient documentation

## 2023-06-29 DIAGNOSIS — E119 Type 2 diabetes mellitus without complications: Secondary | ICD-10-CM | POA: Diagnosis not present

## 2023-06-29 DIAGNOSIS — E669 Obesity, unspecified: Secondary | ICD-10-CM | POA: Diagnosis not present

## 2023-06-29 DIAGNOSIS — Z7982 Long term (current) use of aspirin: Secondary | ICD-10-CM | POA: Insufficient documentation

## 2023-06-29 DIAGNOSIS — E1169 Type 2 diabetes mellitus with other specified complication: Secondary | ICD-10-CM

## 2023-06-29 DIAGNOSIS — Z79899 Other long term (current) drug therapy: Secondary | ICD-10-CM | POA: Insufficient documentation

## 2023-06-29 LAB — CMP (CANCER CENTER ONLY)
ALT: 19 U/L (ref 0–44)
AST: 19 U/L (ref 15–41)
Albumin: 4.9 g/dL (ref 3.5–5.0)
Alkaline Phosphatase: 68 U/L (ref 38–126)
Anion gap: 10 (ref 5–15)
BUN: 26 mg/dL — ABNORMAL HIGH (ref 8–23)
CO2: 27 mmol/L (ref 22–32)
Calcium: 10.3 mg/dL (ref 8.9–10.3)
Chloride: 96 mmol/L — ABNORMAL LOW (ref 98–111)
Creatinine: 0.94 mg/dL (ref 0.44–1.00)
GFR, Estimated: >60 mL/min (ref >60)
Glucose, Bld: 189 mg/dL — ABNORMAL HIGH (ref 70–99)
Potassium: 4.4 mmol/L (ref 3.5–5.1)
Sodium: 133 mmol/L — ABNORMAL LOW (ref 135–145)
Total Bilirubin: 0.7 mg/dL (ref 0.0–1.2)
Total Protein: 7.2 g/dL (ref 6.5–8.1)

## 2023-06-29 LAB — CBC WITH DIFFERENTIAL (CANCER CENTER ONLY)
Abs Immature Granulocytes: 0.07 10*3/uL (ref 0.00–0.07)
Basophils Absolute: 0.1 10*3/uL (ref 0.0–0.1)
Basophils Relative: 1 %
Eosinophils Absolute: 0.1 10*3/uL (ref 0.0–0.5)
Eosinophils Relative: 1 %
HCT: 38 % (ref 36.0–46.0)
Hemoglobin: 12.7 g/dL (ref 12.0–15.0)
Immature Granulocytes: 1 %
Lymphocytes Relative: 33 %
Lymphs Abs: 2.5 10*3/uL (ref 0.7–4.0)
MCH: 28.3 pg (ref 26.0–34.0)
MCHC: 33.4 g/dL (ref 30.0–36.0)
MCV: 84.6 fL (ref 80.0–100.0)
Monocytes Absolute: 0.8 10*3/uL (ref 0.1–1.0)
Monocytes Relative: 10 %
Neutro Abs: 4.2 10*3/uL (ref 1.7–7.7)
Neutrophils Relative %: 54 %
Platelet Count: 225 10*3/uL (ref 150–400)
RBC: 4.49 MIL/uL (ref 3.87–5.11)
RDW: 12.4 % (ref 11.5–15.5)
WBC Count: 7.7 10*3/uL (ref 4.0–10.5)
nRBC: 0 % (ref 0.0–0.2)

## 2023-06-29 LAB — RETICULOCYTES
Immature Retic Fract: 4.2 % (ref 2.3–15.9)
RBC.: 4.5 MIL/uL (ref 3.87–5.11)
Retic Count, Absolute: 60.8 10*3/uL (ref 19.0–186.0)
Retic Ct Pct: 1.4 % (ref 0.4–3.1)

## 2023-06-29 LAB — FERRITIN: Ferritin: 260 ng/mL (ref 11–307)

## 2023-06-29 LAB — IRON AND IRON BINDING CAPACITY (CC-WL,HP ONLY)
Iron: 82 ug/dL (ref 28–170)
Saturation Ratios: 21 % (ref 10.4–31.8)
TIBC: 393 ug/dL (ref 250–450)
UIBC: 311 ug/dL (ref 148–442)

## 2023-06-29 NOTE — Progress Notes (Signed)
 Patient will remain off AI and continue with observation. She has not had any navigational needs for some time. Will discontinue active navigation, but be available to the patient as needed.   Oncology Nurse Navigator Documentation     06/29/2023   10:45 AM  Oncology Nurse Navigator Flowsheets  Navigation Complete Date: 06/29/2023  Post Navigation: Continue to Follow Patient? No  Reason Not Navigating Patient: No Treatment, Observation Only  Navigator Location [facility]  Navigator Encounter Type Follow-up Appt  Patient Visit Type MedOnc  Treatment Phase Other  Barriers/Navigation Needs No Barriers At This Time  Interventions None Required  Acuity Level 1-No Barriers  Support Groups/Services Friends and Family  Time Spent with Patient 15

## 2023-06-29 NOTE — Progress Notes (Signed)
 Hematology and Oncology Follow Up Visit  [name] [id number] August 03, 1940 83 y.o. 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 daily - d/c on 05/24/2023 Feraheme  --  last given 04/19/2020     Interim History:  [name] is back for follow-up.  [name] was seen by ENT.  Apparently, they did an endoscopy on her.  For what [name] says, everything turned out okay.  [name] did have a ultrasound that was done of the neck.  This was done on 06/13/2023.  This showed some small submandibular glands which were not pathologically enlarged.  [name] is swallowing better.  [name] is having no problems with abdominal pain.  It is certainly possible that [name] just may not be tolerant of aromatase inhibitors.  However, I still think we should consider giving Aromasin a chance.  However, I think we should keep her off an aromatase inhibitor for right now.  When we last saw her, the iron  studies showed a ferritin of 172 with an iron  saturation of 35%.  [name] does have diabetes.  Her blood sugars are on the high side.  I think this is going to be a longer-term issue for her.  I am sure that her family doctor will be able to help to get her blood sugars under better control.  [name] has had no change in bowel or bladder habits.  There has been no leg swelling.  [name] has had no bleeding.  There has been no fever.  Overall, I would say her performance status is probably ECOG 1.     Medications:  Current Outpatient Medications:    acetaminophen  (TYLENOL ) 325 MG tablet, Take 2 tablets (650 mg total) by mouth every 6 (six) hours as needed for up to 30 doses for mild pain or moderate pain., Disp: 30 tablet, Rfl: 0   amLODipine  (NORVASC ) 5 MG tablet, TAKE  1 TABLET BY MOUTH DAILY, Disp: 100 tablet, Rfl: 2   aspirin  EC 81 MG tablet, Take 81 mg by mouth daily. Swallow whole., Disp: , Rfl:    Blood Glucose Monitoring Suppl (ONE TOUCH ULTRA 2) w/Device KIT, USE TO CHECK BLOOD SUGAR AS DIRECTED, Disp: 1 kit, Rfl: 0   calcium -vitamin D  (OSCAL WITH D) 500-5 MG-MCG tablet, Take 1 tablet by mouth daily., Disp: , Rfl:    cholecalciferol  (VITAMIN D ) 1000 UNITS tablet, Take 1,000 Units by mouth daily., Disp: , Rfl:    dicyclomine  (BENTYL ) 10 MG capsule, Take 1 capsule (10 mg total) by mouth as needed for spasms., Disp: 60 capsule, Rfl: 0   furosemide  (LASIX ) 20 MG tablet, TAKE 1 TABLET BY MOUTH DAILY AS  NEEDED, Disp: 100 tablet, Rfl: 2   Lancets (ONETOUCH ULTRASOFT) lancets, USE AS DIRECTED 3 TIMES  DAILY, Disp: 300 each, Rfl: 3   losartan  (COZAAR ) 50 MG tablet, TAKE 1 TABLET BY MOUTH TWICE  DAILY, Disp: 200 tablet, Rfl: 2   metFORMIN  (GLUCOPHAGE -XR) 500 MG 24 hr tablet, TAKE 1 TABLET BY MOUTH IN THE  MORNING AND 1 TABLET BY MOUTH AT BEDTIME, Disp: 200 tablet, Rfl: 2   metoCLOPramide  (REGLAN ) 5 MG tablet, TAKE 1 TABLET BY MOUTH TWICE  DAILY AS NEEDED, Disp: 180 tablet, Rfl: 2   metoprolol  tartrate (LOPRESSOR )  50 MG tablet, TAKE 1 TABLET BY MOUTH TWICE  DAILY, Disp: 200 tablet, Rfl: 3   ondansetron  (ZOFRAN ) 4 MG tablet, Take 1 tablet (4 mg total) by mouth every 6 (six) hours as needed., Disp: 30 tablet, Rfl: 5   ONETOUCH ULTRA test strip, CHECK BLOOD SUGAR 3 TIMES DAILY  OR AS NEEDED, Disp: 300 strip, Rfl: 2   pantoprazole  (PROTONIX ) 40 MG tablet, Take 1 tablet (40 mg total) by mouth daily., Disp: 90 tablet, Rfl: 3   potassium chloride  (KLOR-CON  M) 10 MEQ tablet, TAKE 1 TABLET BY MOUTH DAILY, Disp: 100 tablet, Rfl: 2   rosuvastatin  (CRESTOR ) 20 MG tablet, TAKE 1 TABLET BY MOUTH DAILY, Disp: 100 tablet, Rfl: 1   thiamine  (VITAMIN B-1) 100 MG tablet, Take 100 mg by mouth every other day., Disp: , Rfl:    vitamin E 180 MG (400 UNITS) capsule, Take 400 Units by  mouth daily., Disp: , Rfl:    anastrozole  (ARIMIDEX ) 1 MG tablet, Take 1 tablet (1 mg total) by mouth daily. (Patient not taking: Reported on 06/29/2023), Disp: 90 tablet, Rfl: 6   betamethasone  valerate ointment (VALISONE ) 0.1 %, Apply a small amount to affected area topically every other day. (Patient not taking: Reported on 06/29/2023), Disp: 45 g, Rfl: 1   famotidine  (PEPCID ) 40 MG tablet, Take 1 tablet (40 mg total) by mouth daily. (Patient not taking: Reported on 06/29/2023), Disp: 30 tablet, Rfl: 4   hyoscyamine  (LEVSIN SL) 0.125 MG SL tablet, Place 1 tablet (0.125 mg total) under the tongue every 4 (four) hours as needed. (Patient not taking: Reported on 06/29/2023), Disp: 30 tablet, Rfl: 3  Allergies:  Allergies  Allergen Reactions   Tramadol  Other (See Comments)    Dizziness     Past Medical History, Surgical history, Social history, and Family History were reviewed and updated.  Review of Systems: Review of Systems  Constitutional: Negative.   HENT:  Negative.    Eyes: Negative.   Respiratory: Negative.    Cardiovascular: Negative.   Gastrointestinal: Negative.   Endocrine: Negative.   Genitourinary: Negative.    Musculoskeletal: Negative.   Skin: Negative.   Neurological: Negative.   Hematological: Negative.   Psychiatric/Behavioral: Negative.      Physical Exam:  height is 5\' 1"  (1.549 m) and weight is 165 lb (74.8 kg). Her oral temperature is 98.8 F (37.1 C). Her blood pressure is 130/48 (abnormal) and her pulse is 66. Her respiration is 20 and oxygen saturation is 100%.   Wt Readings from Last 3 Encounters:  06/29/23 165 lb (74.8 kg)  06/26/23 163 lb (73.9 kg)  06/12/23 165 lb 3.2 oz (74.9 kg)    Physical Exam Vitals reviewed.  Constitutional:      Comments: Left breast with no masses, edema or erythema.  There is no left axillary adenopathy.  Right breast shows the lumpectomy incision  at about the 9 o'clock position.   There is no distinct mass.  There is  no palpable right axillary adenopathy.  HENT:     Head: Normocephalic and atraumatic.  Eyes:     Pupils: Pupils are equal, round, and reactive to light.  Cardiovascular:     Rate and Rhythm: Normal rate and regular rhythm.     Heart sounds: Normal heart sounds.  Pulmonary:     Effort: Pulmonary effort is normal.     Breath sounds: Normal breath sounds.  Abdominal:     General: Bowel sounds are normal.     Palpations: Abdomen  is soft.  Musculoskeletal:        General: No tenderness or deformity. Normal range of motion.     Cervical back: Normal range of motion.  Lymphadenopathy:     Cervical: No cervical adenopathy.  Skin:    General: Skin is warm and dry.     Findings: No erythema or rash.  Neurological:     Mental Status: [name] is alert and oriented to person, place, and time.  Psychiatric:        Behavior: Behavior normal.        Thought Content: Thought content normal.        Judgment: Judgment normal.    Lab Results  Component Value Date   WBC 7.7 06/29/2023   HGB 12.7 06/29/2023   HCT 38.0 06/29/2023   MCV 84.6 06/29/2023   PLT 225 06/29/2023     Chemistry      Component Value Date/Time   NA 129 (L) 05/23/2023 1242   NA 136 05/08/2016 1305   NA 134 (L) 07/08/2015 1149   K 4.2 05/23/2023 1242   K 4.2 05/08/2016 1305   K 4.4 07/08/2015 1149   CL 92 (L) 05/23/2023 1242   CL 102 05/08/2016 1305   CO2 22 05/23/2023 1242   CO2 26 05/08/2016 1305   CO2 25 07/08/2015 1149   BUN 24 (H) 05/23/2023 1242   BUN 16 05/08/2016 1305   BUN 15.5 07/08/2015 1149   CREATININE 0.88 05/23/2023 1242   CREATININE 0.91 08/05/2021 1415   CREATININE 0.8 07/08/2015 1149      Component Value Date/Time   CALCIUM  10.4 (H) 05/23/2023 1242   CALCIUM  9.4 05/08/2016 1305   CALCIUM  10.0 07/08/2015 1149   ALKPHOS 66 05/23/2023 1242   ALKPHOS 56 05/08/2016 1305   ALKPHOS 62 07/08/2015 1149   AST 20 05/23/2023 1242   AST 24 07/08/2015 1149   ALT 21 05/23/2023 1242   ALT 29 05/08/2016  1305   ALT 30 07/08/2015 1149   BILITOT 0.8 05/23/2023 1242   BILITOT 0.64 07/08/2015 1149      Impression and Plan: Ms. Upshaw is a very charming and very youthful appearing 83 year old [place] female.  [name] has history of stage I infiltrating ductal carcinoma of the right breast.  [name] had a low Oncotype score.  [name] did not require radiotherapy.  Again, we will keep her off any aromatase inhibitor right now.  We will plan to get her back in 6 weeks.  At that time, we will reassess and see if there is a role for an aromatase inhibitor.  I will would like to give Aromasin a chance.  I realize that there may not be a great improvement in survival with adding an aromatase inhibitor but I do think the addition is significant from a standpoint of survival.   Ivor Mars, MD 5/23/202510:51 AM

## 2023-07-05 ENCOUNTER — Ambulatory Visit: Admitting: Cardiology

## 2023-07-09 ENCOUNTER — Ambulatory Visit: Attending: Family Medicine | Admitting: Physical Therapy

## 2023-07-09 ENCOUNTER — Encounter: Payer: Self-pay | Admitting: Physical Therapy

## 2023-07-09 DIAGNOSIS — R2681 Unsteadiness on feet: Secondary | ICD-10-CM

## 2023-07-09 DIAGNOSIS — M25552 Pain in left hip: Secondary | ICD-10-CM

## 2023-07-09 DIAGNOSIS — R2689 Other abnormalities of gait and mobility: Secondary | ICD-10-CM

## 2023-07-09 DIAGNOSIS — M6281 Muscle weakness (generalized): Secondary | ICD-10-CM | POA: Diagnosis not present

## 2023-07-09 NOTE — Therapy (Signed)
 OUTPATIENT PHYSICAL THERAPY TREATMENT / DISCHARGE SUMMARY   Patient Name: [name] MRN: [id number] DOB:[birth year], 83 y.o., female Today's Date: 07/09/2023   END OF SESSION:  PT End of Session - 07/09/23 1406     Visit Number 13    Date for PT Re-Evaluation 07/10/23    Authorization Type [employer] Medicare    Authorization Time Period 06/05/23 - 07/03/23 (*authorization expired - pending reauthorization)    Authorization - Visit Number 2    Authorization - Number of Visits 4    Progress Note Due on Visit 20    PT Start Time 1406    PT Stop Time 1453    PT Time Calculation (min) 47 min    Activity Tolerance Patient tolerated treatment well    Behavior During Therapy [facility] for tasks assessed/performed                     Past Medical History:  Diagnosis Date   Abdominal pain in female 03/18/2010   Qualifier: Diagnosis of  By: Willy Harvest MD, Kelleen Patee E    Anemia 06/08/2014   Anxiety    Arthritis    Spinal Osteoarthritis   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 09/05/2006   Qualifier: Diagnosis of  By: Georganne Kind RMA, Lucy     Diabetic peripheral neuropathy (HCC) 10/29/2013   Encounter for preventative adult health care exam with abnormal findings 09/14/2013   Esophageal reflux    Gastric polyp    Fundic Gland   Gastroparesis    Headache(784.0)    Heart murmur    Echocardiogram 2/11: EF 60-65%, mild LAE, grade 1 diastolic dysfunction, aortic valve sclerosis, mean gradient 9 mm of mercury, PASP 34   Hematuria 03/16/2016   Iron  deficiency anemia, unspecified    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   Leg swelling    bilateral   Neck pain 04/22/2015   PONV (postoperative nausea and vomiting)    pt states body temperature drops every time she has anesthesia; pt states only  needs small amount of anesthesia   PSVT (paroxysmal supraventricular tachycardia) (HCC)    Pure hypercholesterolemia    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   Renal insufficiency 06/18/2019   pt states  L kidney function very low-functions at about 20%   Stroke [facility])    tia, 2014   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   Type II or unspecified type diabetes mellitus without mention of complication, not stated as uncontrolled    Unspecified essential hypertension    Unspecified hereditary and idiopathic peripheral neuropathy 10/29/2013   Vitamin D  deficiency    Past Surgical History:  Procedure Laterality Date   BREAST BIOPSY Right 06/13/2022   US  RT BREAST BX W LOC DEV 1ST LESION IMG BX SPEC US  GUIDE 06/13/2022 [facility] MAMMOGRAPHY   BREAST LUMPECTOMY Right 07/31/2022   Procedure: RIGHT BREAST LUMPECTOMY;  Surgeon: Oza Blumenthal, MD;  Location: [facility];  Service: General;  Laterality: Right;   CHOLECYSTECTOMY  1993   COLONOSCOPY  11/11/2010   diverticulosis   DILATATION & CURRETTAGE/HYSTEROSCOPY WITH RESECTOCOPE N/A 02/25/2013   Procedure: Attempted hysteroscopy with uterine perforation;  Surgeon: Iva Mariner de Epifania Haskell, MD;  Location: [facility];  Service: Gynecology;  Laterality: N/A;  ESOPHAGOGASTRODUODENOSCOPY  08/29/2010; 09/15/2010   Carcinoid tumor less than 1 cm in July 2012 not seen in August 2012 , gastritis, fundic gland polyps   ESOPHAGOGASTRODUODENOSCOPY  05/16/2011   ESOPHAGOGASTRODUODENOSCOPY  06/14/2012   EUS  12/15/2010   Procedure: UPPER ENDOSCOPIC ULTRASOUND (EUS) LINEAR;  Surgeon: Hoyt Macleod, MD;  Location: [facility];  Service: Endoscopy;  Laterality: N/A;   EYE SURGERY Bilateral    Bi lateral cateracts and bi lateral laser   LAPAROSCOPY N/A 02/25/2013   Procedure: Cystoscopy and laparoscopy with fulguration of uterine serosa;  Surgeon: Iva Mariner de Epifania Haskell, MD;  Location: [facility];  Service: Gynecology;  Laterality: N/A;   TONSILLECTOMY     Patient  Active Problem List   Diagnosis Date Noted   Precordial chest pain 12/24/2022   Carcinoma of breast upper outer quadrant, right (HCC) 07/04/2022   SVT (supraventricular tachycardia) (HCC) 06/21/2022   Left-sided weakness 04/02/2022   Unsteady gait 04/02/2022   Oral lesion 03/08/2021   Sun-damaged skin 03/08/2021   Thiamine  deficiency 11/01/2020   Trigeminal neuralgia 08/21/2020   Pedal edema 08/21/2020   Chronic left-sided back pain 03/28/2020   Nocturia 03/28/2020   Left-sided headache 03/28/2020   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   Renal insufficiency 06/18/2019   Educated about COVID-19 virus infection 05/15/2019   Atrophic vaginitis 01/20/2019   Stenosis of carotid artery 11/12/2018   Anxiety 10/13/2018   Referred otalgia of left ear 04/23/2018   Chronic throat clearing 04/23/2018   Sinusitis 10/11/2017   Headache 07/12/2017   Dizzy spells 11/21/2016   Constipation 11/21/2016   Nonrheumatic aortic valve stenosis 05/23/2016   Aortic atherosclerosis (HCC) 05/23/2016   Hematuria 03/16/2016   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   Pain of upper abdomen 06/06/2015   Neck pain 04/22/2015   Ear pain 02/28/2015   Abnormal urine 11/21/2014   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   Anemia 06/08/2014   RLS (restless legs syndrome) 11/23/2013   Diabetic peripheral neuropathy (HCC) 10/29/2013   Left-sided thoracic back pain 10/06/2013   Encounter for preventative adult health care exam with abnormal findings 09/14/2013   Iron  deficiency anemia    Status post laparoscopy 02/25/2013   Hyponatremia 01/09/2013   GERD (gastroesophageal reflux disease) 01/09/2013   Amaurosis fugax of left eye 10/16/2012   Low back pain 06/03/2012   Vitamin D  deficiency 03/11/2012   Bilateral hand pain 10/20/2011   Encounter for long-term (current) use of other medications 10/20/2011   IBS (irritable bowel syndrome) 08/14/2011   TIA (transient ischemic attack) 02/10/2011   Abnormal brain  CT 01/19/2011   Allergic rhinitis 10/01/2010   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   Preventative health care 07/15/2010   FUNDIC GLAND POLYPS OF STOMACH 03/18/2010   Abdominal pain in female 03/18/2010   Left hip pain 03/17/2009   SYSTOLIC MURMUR 03/02/2009   Paroxysmal supraventricular tachycardia (HCC) 01/12/2009   PLANTAR FASCIITIS 06/08/2008   CHEST PAIN 05/18/2008   Gastroparesis 12/18/2007   HYPERCHOLESTEROLEMIA 06/11/2007   Type 2 diabetes mellitus with obesity (HCC) 09/05/2006   Essential hypertension 09/05/2006    PCP: Neda Balk, MD   REFERRING PROVIDER: Neda Balk, MD   REFERRING DIAG:  M79.10 (ICD-10-CM) - Myalgia  R26.81 (ICD-10-CM) - Unsteady gait  R53.1 (ICD-10-CM) - Weakness   THERAPY DIAG:  Muscle weakness (generalized)  Other abnormalities of gait and mobility  Unsteadiness on feet  Pain in left hip  RATIONALE FOR EVALUATION AND TREATMENT: Rehabilitation  ONSET  DATE: Worsening over the past year, especially since breast cancer diagnosis and May 2024  NEXT MD VISIT: 07/24/2023   SUBJECTIVE:                                                                                                                                                                                                         SUBJECTIVE STATEMENT: Pt reports she is doing pretty good today.  EVAL: Pt reports her weakness and pains seems to be exacerbated by recent meds she has had to take. She notes difficulty climbing stairs and does not feel safe using a step stool to reach high things.  She feels like she has no energy.  She reports chronic belly pain, maybe from meds vs diverticulitis.  L hip pain has been an issue for several months but patient has been told there is no "bony" reason for her pain (x-rays negative).  PAIN: Are you having pain? No  PERTINENT HISTORY:  Extensive PMH including chronic back and neck pain, OA, osteoporosis, DM, HTN, h/o TIA,  diabetic peripheral neuropathy, PVD, headaches, sleep dysfunction, anxiety, R breast cancer s/p lumpectomy 07/31/22, SVT (refer to above problem list and past medical/surgical history for full PMH)   PRECAUTIONS: Fall  RED FLAGS: None  WEIGHT BEARING RESTRICTIONS: No  FALLS:  Has patient fallen in last 6 months? No - admits to a few close calls due to dizziness/lightheadness  LIVING ENVIRONMENT: Lives with: lives alone  Lives in: House/apartment Stairs: No Has following equipment at home: Single point cane, Environmental consultant - 4 wheeled, shower chair, and Grab bars  OCCUPATION: Retired  PLOF: Independent, Needs assistance with homemaking, Leisure: read and play card games on her phone, traveling, and housekeeping/cleaning service ~1x/month   PATIENT GOALS: "To be able to walk steady and feel comfortable going up stairs so I can visit my niece and nephew. To be able to carry groceries."   OBJECTIVE: (objective measures completed at initial evaluation unless otherwise dated)  DIAGNOSTIC FINDINGS:  01/22/23 & 10/05/22 - DH Left hip (2 several months of L hip pain) IMPRESSION: Negative.  COGNITION: Overall cognitive status: Within functional limits for tasks assessed   SENSATION: Numbness and tingling in hands and feet from DM and cervical radiculopathy  POSTURE:  rounded shoulders, forward head, increased thoracic kyphosis, and flexed trunk   PALPATION: Increased TTP over L TFL, hip flexors and abductors  UPPER EXTREMITY MMT:  MMT Right eval Left eval  Shoulder flexion 4- 4-  Shoulder extension 3+ 3+  Shoulder abduction 4- 4-  Shoulder adduction  Shoulder internal rotation 4- 4-  Shoulder external rotation 3+ 3+  Middle trapezius    Lower trapezius     (Blank rows = not tested)  MUSCLE LENGTH: Hamstrings: mod tight B ITB: mod tight B  Piriformis: severe tight B Hip flexors: mild/mod tight L>R  Quads: mild/mod tight L>R  Heelcord: mod tight B  LOWER EXTREMITY ROM:     Mildly limited primarily due to muscle tightness  LOWER EXTREMITY MMT:  (tested in sitting on eval)  MMT Right eval Left eval R 05/29/23 L 05/29/23 R 07/09/23 L 07/09/23  Hip flexion 3+ 3+ 4- 4- 4 4  Hip extension 3- 3- 4- 4- 4 4  Hip abduction 3+ 3+ 4 4 4+ 4+  Hip adduction 3+ 3+ 4 4 4+ 4+  Hip internal rotation 3- 3- 4 4- 4 4  Hip external rotation 3- 3- 4 4 4+ 4+  Knee flexion 4- 4- 4+ 4 4+ 4+  Knee extension 4- 4 4+ 4+ 4+ 4+  Ankle dorsiflexion 3+ 4- 4 4 4+ 4+  Ankle plantarflexion        Ankle inversion        Ankle eversion        (Blank rows = not tested)  BED MOBILITY:  Sit to supine Min A Supine to sit Min A Rolling to Right SBA  TRANSFERS: Assistive device utilized: None  Sit to stand: Complete Independence Stand to sit: Complete Independence Chair to chair: Modified independence Floor: NT  GAIT: Distance walked: Clinic distances Assistive device utilized: None Level of assistance: Complete Independence and SBA Gait pattern: decreased stride length and trunk flexed Comments: Decreased gait speed  RAMP: Level of Assistance: NT Assistive device utilized:   Ramp Comments:   CURB:  Level of Assistance: NT Assistive device utilized:   Curb Comments:   STAIRS: Level of Assistance: SBA Stair Negotiation Technique: Step to Pattern, Sideways (descent), Forwards (ascent) with Single Rail on Right Number of Stairs: 14  Height of Stairs: 7"  Comments: forward for ascent, sideways leading with R foot for descent    FUNCTIONAL TESTS: (TBA next visit) 5 times sit to stand: 30.43 sec; >15 sec indicates a risk for recurrent falls  Timed up and go (TUG): 16.03 sec; >13.5 sec indicates a high risk for falls  10 meter walk test: 13.91 sec ; Gait speed = 2.36 ft/sec; >/=2.62 ft/sec is necessary to be considered a Materials engineer Scale: 44/56; Scores of 37-45 indicate significant risk for falls (>80%)  Dynamic Gait Index: 15/24; Scores of 19 or less are  predictive of falls in older community living adults.    05/03/23   TUG: 13.5 sec  05/15/23 = 11.78 sec Gait speed = 2.78 ft/sec  05/29/23 5xSTS = 25.78 sec; ; >15 sec indicates a risk for recurrent falls Berg = 50/56; Scores of 46-51 indicate moderate (>50%) risk for falls DGI = 18/24; Scores of 19 or less are predicitve of falls in older community living adults. Berg Balance Test  Sit to Stand Able to stand without using hands and stabilize independently   Standing Unsupported Able to stand safely 2 minutes   Sitting with Back Unsupported but Feet Supported on Floor or Stool Able to sit safely and securely 2 minutes   Stand to Sit Sits safely with minimal use of hands   Transfers Able to transfer safely, minor use of hands   Standing Unsupported with Eyes Closed Able to stand 10 seconds safely  Standing Unsupported with Feet Together Able to place feet together independently and stand 1 minute safely   From Standing, Reach Forward with Outstretched Arm Can reach confidently >25 cm (10")   From Standing Position, Pick up Object from Floor Able to pick up shoe safely and easily   From Standing Position, Turn to Look Behind Over each Shoulder Looks behind one side only/other side shows less weight shift   Turn 360 Degrees Able to turn 360 degrees safely one side only in 4 seconds or less   Standing Unsupported, Alternately Place Feet on Step/Stool Able to stand independently and complete 8 steps >20 seconds   Standing Unsupported, One Foot in Front Able to plae foot ahead of the other independently and hold 30 seconds   Standing on One Leg Able to lift leg independently and hold equal to or more than 3 seconds   Total Score 50   Berg comment: 46-51 moderate (>50%)   Dynamic Gait Index  Level Surface Normal   Change in Gait Speed Moderate Impairment   Gait with Horizontal Head Turns Normal   Gait with Vertical Head Turns Normal   Gait and Pivot Turn Mild Impairment   Step Over  Obstacle Mild Impairment   Step Around Obstacles Normal   Steps Moderate Impairment   Total Score 18   DGI comment: Scores of 19 or less are predicitve of falls in older community living adults.     PATIENT SURVEYS:  ABC scale 580 / 1600 = 36.3 %, scores of <50% indicating a low level of physical functioning and scores of <67% indicating a risk for falls in patients with vestibular disorders 05/29/23: 770 / 1600 = 48.1 %   TODAY'S TREATMENT:   07/09/23 PHYSICAL PERFORMANCE TEST or MEASUREMENT: Berg = 52/56, 52-55 lower risk for falls (> 25%)  DGI = 21/24, medium fall risk  Berg Balance Test   Sit to Stand Able to stand without using hands and stabilize independently    Standing Unsupported Able to stand safely 2 minutes    Sitting with Back Unsupported but Feet Supported on Floor or Stool Able to sit safely and securely 2 minutes    Stand to Sit Sits safely with minimal use of hands    Transfers Able to transfer safely, minor use of hands    Standing Unsupported with Eyes Closed Able to stand 10 seconds safely    Standing Unsupported with Feet Together Able to place feet together independently and stand 1 minute safely    From Standing, Reach Forward with Outstretched Arm Can reach confidently >25 cm (10")    From Standing Position, Pick up Object from Floor Able to pick up shoe safely and easily    From Standing Position, Turn to Look Behind Over each Shoulder Looks behind one side only/other side shows less weight shift    Turn 360 Degrees Able to turn 360 degrees safely one side only in 4 seconds or less    Standing Unsupported, Alternately Place Feet on Step/Stool Able to stand independently and safely and complete 8 steps in 20 seconds    Standing Unsupported, One Foot in Front Able to place foot tandem independently and hold 30 seconds    Standing on One Leg Able to lift leg independently and hold equal to or more than 3 seconds    Total Score 52    Berg comment: 52-55 lower risk  for falls (> 25%)      Dynamic Gait Index   Level  Surface Normal    Change in Gait Speed Mild Impairment    Gait with Horizontal Head Turns Normal    Gait with Vertical Head Turns Normal    Gait and Pivot Turn Mild Impairment    Step Over Obstacle Normal    Step Around Obstacles Normal    Steps Mild Impairment    Total Score 21    DGI comment: 19-22 = medium fall risk    THERAPEUTIC ACTIVITIES: To improve functional performance.  Demonstration, verbal and tactile cues throughout for technique. 5xSTS =  ABC scale: 860 / 1600 = 53.8 %, 50-80% = moderate level of physical functioning LE MMT Goal assessment  SELF CARE: Provided education to prevent loss of gains achieved with physical therapy and to prevent future decline in function.  Verbal review of [place] fall prevention program exercises and continued self progression as well as recommended frequency for continued home performance.   06/19/23 THERAPEUTIC EXERCISE: To improve strength and endurance.  Demonstration, verbal and tactile cues throughout for technique.  UBE - L1.0 x 6 min (3' each fwd & back)  THERAPEUTIC ACTIVITIES: To improve functional performance.  Demonstration, verbal and tactile cues throughout for technique.  6" forward step up + step down (B feet on step) w/ counter support x 10 B 6" lateral step up w/ counter support x 10 B 6" step & over (1 foot on step) w/ counter support x 10 B 6" cross-over lateral step up w/ counter support x 10 B   06/05/23 THERAPEUTIC EXERCISE: To improve strength and endurance.  Demonstration, verbal and tactile cues throughout for technique. NuStep L2 x 6 min LS stretch x 30" B  NEUROMUSCULAR RE-EDUCATION: To improve coordination, kinesthesia, posture, and proprioception. Seated horizontal ABD w/ YTB - 2 x 10 Standing scap retraction + shoulder row w/ YTB x 10 Standing scap retraction + ext w/ YTB x 10 "V" Wall slides x 10 "V" Wall slides w/ YTB x 10 Wall Clock Reaches w/ YTB  x 4 B 6 in forward step up w/ counter support x 10 B 6 in lateral step down w/ counter support x 10 B   PATIENT EDUCATION:  Education details: progress with PT, HEP review, and recommended frequency for ongoing HEP at discharge to prevent loss of gains achieved with PT  Person educated: Patient Education method: Explanation Education comprehension: verbalized understanding  HOME EXERCISE PROGRAM: Access Code: [email or web address] URL: [email or web address] Date: 04/24/2023 Prepared by: Felecia Hopper  Patient Education - Check for Safety - [place] Program   ASSESSMENT:  CLINICAL IMPRESSION: [name] has demonstrated good progress with PT with gains noted in overall LE strength as well as improved functional mobility and gait including curb and stair negotiation.  Gains noted across all standardized balance testing with 5xSTS improved from 30.43 sec on eval to 22.59 sec, Berg improved from 44/56 on eval to 52/56, and DGI improved from 15/24 on eval to 21/24, indicating a decrease in risk for falls.  Overall BLE strength now 4/5 to 4+/5.  All PT goals now met except 5xSTS goal and [name] appears ready to transition to her HEP, therefore will proceed with discharge from physical therapy for this episode.   OBJECTIVE IMPAIRMENTS: Abnormal gait, decreased activity tolerance, decreased balance, decreased coordination, decreased endurance, decreased knowledge of condition, decreased knowledge of use of DME, decreased mobility, difficulty walking, decreased ROM, decreased strength, decreased safety awareness, dizziness, increased fascial restrictions, impaired perceived functional ability, increased muscle spasms, impaired flexibility, impaired sensation, impaired UE functional use, improper  body mechanics, postural dysfunction, and pain.   ACTIVITY LIMITATIONS: carrying, lifting, bending, standing, squatting, sleeping, stairs, transfers, bed mobility, and locomotion level  PARTICIPATION  LIMITATIONS: meal prep, cleaning, laundry, driving, shopping, community activity, and church  PERSONAL FACTORS: Age, Fitness, Past/current experiences, Social background, Time since onset of injury/illness/exacerbation, and 3+ comorbidities: Extensive PMH including chronic back and neck pain, OA, osteoporosis, DM, HTN, h/o TIA, diabetic peripheral neuropathy, PVD, headaches, sleep dysfunction, anxiety, R breast cancer s/p lumpectomy 07/31/22, SVT (refer to above problem list and past medical/surgical history for full PMH)  are also affecting patient's functional outcome.   REHAB POTENTIAL: Good  CLINICAL DECISION MAKING: Unstable/unpredictable  EVALUATION COMPLEXITY: High   GOALS: Goals reviewed with patient? Yes  SHORT TERM GOALS: Target date: 05/01/2023  Patient will be independent with initial HEP to improve outcomes and carryover.  Baseline:  Goal status: MET - 05/01/23 - pt denies any questions or concerns with [place] fall prevention program  2.  Patient will be educated on strategies to decrease risk of falls.  Baseline:  Goal status: MET - 04/24/23  3.  Patient will demonstrate decreased TUG time to </= 13.5 sec to decrease risk for falls with transitional mobility. Baseline:  Goal status: MET - 13.5 sec - 05/03/23  LONG TERM GOALS: Target date: 05/29/2023, extended to 07/10/2023  Patient will be independent with advanced/ongoing HEP to facilitate ability to maintain/progress functional gains from skilled physical therapy services. Baseline:  Goal status: MET - 07/09/23 - pt reports completing the [facility] Prevention program ~2x/wk currently - she denies any concerns  2.  Patient will be able to ambulate 600' with or w/o LRAD on variable surfaces with good safety to access community.  Baseline:  05/29/23 - no issues on indoors surfaces, deferred outdoor assessment due to weather Goal status: MET - 07/09/23  3.  Patient will be able to step up/down curb safely with or w/o LRAD for  safety with community ambulation.  Baseline:  06/19/23 - shaky with 6" curb ascent/descent w/o UE support (reaching for PT) Goal status: MET - 07/09/23  4.  Patient will demonstrate improved B LE strength to >/= 4/5 for improved stability and ease of mobility. Baseline: Refer to above LE MMT table 05/29/23 - gains noted in all muscle groups, but B hip flexion, extension and L hip IR still 4-/5 Goal status: MET - 07/09/23  5.  Patient will improve 5x STS time to </= 15 seconds for improved efficiency and safety with transfers. Baseline: 30.43 sec (04/09/23) 05/29/23 - 25.78 sec Goal status: NOT MET - 07/09/23 - 22.59 sec  6.  Patient will demonstrate gait speed of >/= 2.62 ft/sec (0.8 m/s) to be a safe community ambulator with decreased risk for recurrent falls.  Baseline: 2.36 ft/sec (04/09/23) Goal status: MET - 05/15/23 - 2.78 ft/sec  7.  Patient will improve Berg score by at least 8 points to improve safety and stability with ADLs in standing and reduce risk for falls.  Baseline: 44/56 (04/09/23) 05/29/23 - 50/56 Goal status: MET - 07/09/23 - 52/56  8.  Patient will demonstrate at least 19/24 on DGI or 19/30 on FGA to improve gait stability and reduce risk for falls. Baseline: DGI = 15/24 (04/09/23) 05/29/23 - DGI = 18/24 Goal status: MET - 07/09/23 - 21/24  9. Patient will report >/= 50% on ABC scale to demonstrate improved balance confidence with functional mobility and gait. Baseline: 580 / 1600 = 36.3 % 05/29/23 - 770 / 1600 = 48.1 %  Goal status: MET -  07/09/23 - 860 / 1600 = 53.8 %   PLAN:  PT FREQUENCY: 2x/week  PT DURATION: 8 weeks  PLANNED INTERVENTIONS: [id number]- PT Re-evaluation, 97110-Therapeutic exercises, 97530- Therapeutic activity, 97112- Neuromuscular re-education, 97535- Self Care, [id number]- Manual therapy, [phone]- Gait training, [phone]- Canalith repositioning, [id number]- Electrical stimulation (unattended), 97035- Ultrasound, 65784- Ionotophoresis 4mg /ml Dexamethasone , Patient/Family  education, Balance training, Stair training, Taping, Dry Needling, Joint mobilization, Spinal mobilization, Vestibular training, Cryotherapy, Moist heat, and 97750- Physical performance test or measurement  PLAN FOR NEXT SESSION: Review [place] fall prevention program PRN; LE strengthening - functional emphasis for stair negotiation; progress balance training    PHYSICAL THERAPY DISCHARGE SUMMARY  Visits from Start of Care: 13  Current functional level related to goals / functional outcomes: Refer to above clinical impression and goal assessment.    Remaining deficits: Mild proximal LE weakness 5xSTS = 22.59 sec, >15 sec indicates risk for recurrent falls   Education / Equipment: [place] fall prevention program  Patient agrees to discharge. Patient goals were mostly met. Patient is being discharged due to meeting the stated rehab goals.   Francisco Irving, PT 07/09/2023, 2:53 PM    Date of referral: 01/16/2023 Referring provider: Neda Balk, MD Referring diagnosis?  M79.10 (ICD-10-CM) - Myalgia  R26.81 (ICD-10-CM) - Unsteady gait  R53.1 (ICD-10-CM) - Weakness  Treatment diagnosis? (if different than referring diagnosis)  Muscle weakness (generalized)  Other abnormalities of gait and mobility  Unsteadiness on feet  Pain in left hip  What was this (referring dx) caused by? Ongoing Issue and Other: Sequelae of treatments for breast cancer  Nature of Condition: Chronic (continuous duration > 3 months)   Laterality: Lt  Current Functional Measure Score: Other ABC scale = 770 / 1600 = 48.1 % (05/29/23);  580 / 1600 = 36.3 % as of eval on 04/24/23  Objective measurements identify impairments when they are compared to normal values, the uninvolved extremity, and prior level of function.  [x]  Yes  []  No  Objective assessment of functional ability: Severe functional limitations   Briefly describe symptoms: Patient presents with physical impairments of L anterior lateral hip  pain, impaired activity tolerance, global UE/LE weakness, dizziness, impaired standing balance, impaired ambulation, inability to navigate stairs and decreased safety awareness impacting safe and independent functional mobility.  Gains noted in LE strength and across all standardized balance tests since start of PT but remain shy of PT goals.  How did symptoms start: Gradual onset exacerbated by treatment for breast cancer starting May 2024  Average pain intensity:  Last 24 hours: 7/10  Past week: 8/10  How often does the pt experience symptoms? Frequently  How much have the symptoms interfered with usual daily activities? Quite a bit  How has condition changed since care began at this facility? Better  In general, how is the patients overall health? Fair  Onset date: Chronic   BACK PAIN (STarT Back Screening Tool) - (When applicable): N/A  Has your back pain spread down your leg(s) at sometime in the last 2 weeks? []  Yes   []  No Have you had pain in the shoulder or neck at sometime in the past 2 weeks? []  Yes   []  No Have you only walked short distances because of your back pain? []  Yes   []  No In the past 2 weeks, have you dressed more slowly than usual because of your back pain? []  Yes   []  No Do you think it is not really  safe for person with a condition like yours to be physically active? []  Yes   []  No Have worrying thoughts been going through your mind a lot of the time? []  Yes   []  No Do you feel that your back pain is terrible and it is never going to get any better? []  Yes   []  No In general, have you stopped enjoying all the things you usually enjoy? []  Yes   []  No Overall, how bothersome has your back pain been in the last 2 weeks? []  Not at all   []  Slightly     []  Moderate   []  Very much     []  Extremely

## 2023-07-18 ENCOUNTER — Other Ambulatory Visit

## 2023-07-19 ENCOUNTER — Other Ambulatory Visit (INDEPENDENT_AMBULATORY_CARE_PROVIDER_SITE_OTHER)

## 2023-07-19 DIAGNOSIS — E1169 Type 2 diabetes mellitus with other specified complication: Secondary | ICD-10-CM | POA: Diagnosis not present

## 2023-07-19 DIAGNOSIS — E559 Vitamin D deficiency, unspecified: Secondary | ICD-10-CM | POA: Diagnosis not present

## 2023-07-19 DIAGNOSIS — I10 Essential (primary) hypertension: Secondary | ICD-10-CM | POA: Diagnosis not present

## 2023-07-19 DIAGNOSIS — E7800 Pure hypercholesterolemia, unspecified: Secondary | ICD-10-CM | POA: Diagnosis not present

## 2023-07-19 NOTE — Addendum Note (Signed)
 Addended by: Marigene Shoulder on: 07/19/2023 08:50 AM   Modules accepted: Orders

## 2023-07-20 ENCOUNTER — Ambulatory Visit: Payer: Self-pay | Admitting: Family Medicine

## 2023-07-20 LAB — COMPREHENSIVE METABOLIC PANEL WITH GFR
AG Ratio: 1.7 (calc) (ref 1.0–2.5)
ALT: 19 U/L (ref 6–29)
AST: 20 U/L (ref 10–35)
Albumin: 4.4 g/dL (ref 3.6–5.1)
Alkaline phosphatase (APISO): 57 U/L (ref 37–153)
BUN: 23 mg/dL (ref 7–25)
CO2: 23 mmol/L (ref 20–32)
Calcium: 10.1 mg/dL (ref 8.6–10.4)
Chloride: 100 mmol/L (ref 98–110)
Creat: 0.81 mg/dL (ref 0.60–0.95)
Globulin: 2.6 g/dL (ref 1.9–3.7)
Glucose, Bld: 123 mg/dL — ABNORMAL HIGH (ref 65–99)
Potassium: 4.3 mmol/L (ref 3.5–5.3)
Sodium: 135 mmol/L (ref 135–146)
Total Bilirubin: 0.6 mg/dL (ref 0.2–1.2)
Total Protein: 7 g/dL (ref 6.1–8.1)
eGFR: 72 mL/min/{1.73_m2} (ref >=60)

## 2023-07-20 LAB — TSH: TSH: 2.24 m[IU]/L (ref 0.40–4.50)

## 2023-07-20 LAB — CBC WITH DIFFERENTIAL/PLATELET
Absolute Lymphocytes: 2836 {cells}/uL (ref 850–3900)
Absolute Monocytes: 719 {cells}/uL (ref 200–950)
Basophils Absolute: 63 {cells}/uL (ref 0–200)
Basophils Relative: 0.8 %
Eosinophils Absolute: 142 {cells}/uL (ref 15–500)
Eosinophils Relative: 1.8 %
HCT: 37.5 % (ref 35.0–45.0)
Hemoglobin: 12.2 g/dL (ref 11.7–15.5)
MCH: 28.3 pg (ref 27.0–33.0)
MCHC: 32.5 g/dL (ref 32.0–36.0)
MCV: 87 fL (ref 80.0–100.0)
MPV: 10.6 fL (ref 7.5–12.5)
Monocytes Relative: 9.1 %
Neutro Abs: 4140 {cells}/uL (ref 1500–7800)
Neutrophils Relative %: 52.4 %
Platelets: 204 10*3/uL (ref 140–400)
RBC: 4.31 10*6/uL (ref 3.80–5.10)
RDW: 12.5 % (ref 11.0–15.0)
Total Lymphocyte: 35.9 %
WBC: 7.9 10*3/uL (ref 3.8–10.8)

## 2023-07-20 LAB — HEMOGLOBIN A1C
Hgb A1c MFr Bld: 6.6 % — ABNORMAL HIGH (ref <5.7)
Mean Plasma Glucose: 143 mg/dL
eAG (mmol/L): 7.9 mmol/L

## 2023-07-20 LAB — LIPID PANEL
Cholesterol: 190 mg/dL (ref <200)
HDL: 75 mg/dL (ref >=50)
LDL Cholesterol (Calc): 94 mg/dL
Non-HDL Cholesterol (Calc): 115 mg/dL (ref <130)
Total CHOL/HDL Ratio: 2.5 (calc) (ref <5.0)
Triglycerides: 117 mg/dL (ref <150)

## 2023-07-20 LAB — VITAMIN D 25 HYDROXY (VIT D DEFICIENCY, FRACTURES): Vit D, 25-Hydroxy: 45 ng/mL (ref 30–100)

## 2023-07-23 NOTE — Assessment & Plan Note (Signed)
 Supplement and monitor

## 2023-07-23 NOTE — Assessment & Plan Note (Signed)
 Hydrate and monitor

## 2023-07-24 ENCOUNTER — Telehealth (INDEPENDENT_AMBULATORY_CARE_PROVIDER_SITE_OTHER): Admitting: Family Medicine

## 2023-07-24 ENCOUNTER — Encounter: Payer: Self-pay | Admitting: Family Medicine

## 2023-07-24 DIAGNOSIS — E1169 Type 2 diabetes mellitus with other specified complication: Secondary | ICD-10-CM

## 2023-07-24 DIAGNOSIS — E7800 Pure hypercholesterolemia, unspecified: Secondary | ICD-10-CM

## 2023-07-24 DIAGNOSIS — I10 Essential (primary) hypertension: Secondary | ICD-10-CM

## 2023-07-24 DIAGNOSIS — E669 Obesity, unspecified: Secondary | ICD-10-CM

## 2023-07-24 DIAGNOSIS — E559 Vitamin D deficiency, unspecified: Secondary | ICD-10-CM | POA: Diagnosis not present

## 2023-07-24 DIAGNOSIS — N289 Disorder of kidney and ureter, unspecified: Secondary | ICD-10-CM

## 2023-07-24 NOTE — Progress Notes (Signed)
 MyChart Phone Visit    Virtual Visit via phone Note   This patient is at least at moderate risk for complications without adequate follow up. This format is felt to be most appropriate for this patient at this time. Physical exam was limited by quality of the audio technology used for the visit. Porsha, CMA was able to get the patient set up on a video visit.  Patient location: home Patient and provider in visit Provider location: Office  I discussed the limitations of evaluation and management by telemedicine and the availability of in person appointments. The patient expressed understanding and agreed to proceed.  Visit Date: 07/24/2023  Today's healthcare provider: Randie Bustle, MD     Subjective:    [name], female    DOB: 01/05/1941, 83 y.o.   MRN: [id number]  Chief Complaint  Patient presents with   Medical Management of Chronic Issues    Patient presents today for a month follow-up.   Quality Metric Gaps    AWV, TDAP, zoster, foot exam    HPI Discussed the use of AI scribe software for clinical note transcription with the patient, who gave verbal consent to proceed.  History of Present Illness [name] is an 83 year old female with chronic kidney disease and diabetes who presents for evaluation of kidney function and blood sugar management.  She is concerned about her kidney function after recent blood tests indicated an abnormal glomerular filtration rate (GFR). Her GFR is above 60, but she is worried about the long-term implications. Her blood urea  nitrogen (BUN) and creatinine levels are normal. She experiences frequent urination, waking up two to three times a night, which she finds bothersome. She also notes seeing foam in her urine occasionally and is aware of having a small kidney. She is currently taking losartan .  She manages her diabetes with metformin . Her recent hemoglobin A1c is 6.6%, but she experiences postprandial  blood sugar spikes over 200 mg/dL, particularly after dinner. Her blood sugar can drop below 100 mg/dL if she does not consume protein with her meals, leading to symptoms of hypoglycemia such as shaking. She is interested in dietary management to help control these fluctuations.  She has a history of breast cancer and has experienced significant side effects from cancer medications, which have impacted her quality of life. She is currently not on any cancer medication. She also has a history of stomach carcinoma diagnosed ten years ago, which was followed up for two years. She has ongoing stomach issues.  She is taking rosuvastatin  for cholesterol management, with her LDL currently at 94 mg/dL. She is considering lifestyle modifications, including exercise, to help manage her cholesterol levels.    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 09/05/2006   Qualifier: Diagnosis of  By: Georganne Kind RMA, Lucy     Diabetic peripheral neuropathy (HCC) 10/29/2013   Encounter for preventative adult health care exam with abnormal findings 09/14/2013   Esophageal reflux  Gastric polyp    Fundic Gland   Gastroparesis    Headache(784.0)    Heart murmur    Echocardiogram 2/11: EF 60-65%, mild LAE, grade 1 diastolic dysfunction, aortic valve sclerosis, mean gradient 9 mm of mercury, PASP 34   Hematuria 03/16/2016   Iron  deficiency anemia, unspecified    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   Type II or unspecified type diabetes mellitus without mention of complication, not stated as uncontrolled    Unspecified essential hypertension    Unspecified hereditary and idiopathic peripheral neuropathy 10/29/2013   Vitamin D  deficiency     Past Surgical History:  Procedure Laterality Date   BREAST BIOPSY Right 06/13/2022   US  RT BREAST BX W LOC DEV 1ST LESION IMG BX SPEC US  GUIDE 06/13/2022 [facility] MAMMOGRAPHY   BREAST LUMPECTOMY Right 07/31/2022   Procedure: RIGHT BREAST LUMPECTOMY;  Surgeon: Oza Blumenthal, MD;  Location: [facility];  Service: General;  Laterality: Right;   CHOLECYSTECTOMY  1993   COLONOSCOPY  11/11/2010   diverticulosis   DILATATION & CURRETTAGE/HYSTEROSCOPY WITH RESECTOCOPE N/A 02/25/2013   Procedure: Attempted hysteroscopy with uterine perforation;  Surgeon: Iva Mariner de Epifania Haskell, MD;  Location: [facility];  Service: Gynecology;  Laterality: N/A;   ESOPHAGOGASTRODUODENOSCOPY  08/29/2010; 09/15/2010   Carcinoid tumor less than 1 cm in July 2012 not seen in August 2012 , gastritis, fundic gland polyps   ESOPHAGOGASTRODUODENOSCOPY  05/16/2011   ESOPHAGOGASTRODUODENOSCOPY  06/14/2012   EUS  12/15/2010   Procedure: UPPER ENDOSCOPIC ULTRASOUND (EUS) LINEAR;  Surgeon: Hoyt Macleod, MD;  Location: [facility];  Service: Endoscopy;  Laterality: N/A;   EYE SURGERY Bilateral    Bi lateral cateracts and bi lateral laser   LAPAROSCOPY N/A 02/25/2013   Procedure: Cystoscopy and laparoscopy with fulguration of uterine serosa;  Surgeon: Iva Mariner de Epifania Haskell, MD;  Location: [facility];  Service:  Gynecology;  Laterality: N/A;   TONSILLECTOMY      Family History  Problem Relation Age of Onset   Diabetes Mother    Stroke Father        deceased age 34   Heart disease Sister        deceased MI age 60   Diabetes Sister    Heart disease Sister    Hypertension Sister    Hyperlipidemia Sister    Diabetes Sister    Heart disease Sister    Hypertension Sister    Hyperlipidemia Sister    Heart disease Brother        deceased MI age 74   Intellectual disability Brother    Diabetes Brother    Heart disease Brother    Hypertension Brother    Hyperlipidemia Brother    Diabetes Maternal Grandmother    Hypertension Paternal Grandmother    Colon cancer Neg Hx    Esophageal cancer Neg Hx    Stomach cancer Neg Hx    Rectal cancer Neg Hx  Social History   Socioeconomic History   Marital status: Widowed    Spouse name: Not on file   Number of children: 0   Years of education: college   Highest education level: Not on file  Occupational History   Occupation: retired  Tobacco Use   Smoking status: Never   Smokeless tobacco: Never   Tobacco comments:    Never used tobacco  Vaping Use   Vaping status: Never Used  Substance and Sexual Activity   Alcohol  use: No    Alcohol /week: 0.0 standard drinks of alcohol    Drug use: No   Sexual activity: Not Currently    Partners: Male    Birth control/protection: Post-menopausal    Comment: lives alone, no dietary restrictions except avoid fresh veg, fruit, whole grains  Other Topics Concern   Not on file  Social History Narrative   Patient was married ([name]) - widow   Patient does not have any children.   Patient is right-handed.   Patient has a BA degree.   One caffeine drink daily    Social Drivers of Corporate investment banker Strain: Low Risk  (09/07/2022)   Overall Financial Resource Strain (CARDIA)    Difficulty of Paying Living Expenses: Not very hard  Food Insecurity: No Food Insecurity (06/30/2022)   Hunger  Vital Sign    Worried About Running Out of Food in the Last Year: Never true    Ran Out of Food in the Last Year: Never true  Transportation Needs: No Transportation Needs (06/30/2022)   PRAPARE - Administrator, Civil Service (Medical): No    Lack of Transportation (Non-Medical): No  Recent Concern: Transportation Needs - Unmet Transportation Needs (06/30/2022)   PRAPARE - Administrator, Civil Service (Medical): Yes    Lack of Transportation (Non-Medical): No  Physical Activity: Inactive (09/07/2022)   Exercise Vital Sign    Days of Exercise per Week: 0 days    Minutes of Exercise per Session: 0 min  Stress: No Stress Concern Present (09/07/2022)   [employer] of Occupational Health - Occupational Stress Questionnaire    Feeling of Stress : Only a little  Recent Concern: Stress - Stress Concern Present (07/10/2022)   [employer] of Occupational Health - Occupational Stress Questionnaire    Feeling of Stress : To some extent  Social Connections: Moderately Isolated (09/07/2022)   Social Connection and Isolation Panel    Frequency of Communication with Friends and Family: More than three times a week    Frequency of Social Gatherings with Friends and Family: Once a week    Attends Religious Services: More than 4 times per year    Active Member of [employer] or Organizations: No    Attends Banker Meetings: Never    Marital Status: Widowed  Intimate Partner Violence: Not At Risk (06/30/2022)   Humiliation, Afraid, Rape, and Kick questionnaire    Fear of Current or Ex-Partner: No    Emotionally Abused: No    Physically Abused: No    Sexually Abused: No    Outpatient Medications Prior to Visit  Medication Sig Dispense Refill   acetaminophen  (TYLENOL ) 325 MG tablet Take 2 tablets (650 mg total) by mouth every 6 (six) hours as needed for up to 30 doses for mild pain or moderate pain. 30 tablet 0   amLODipine  (NORVASC ) 5 MG tablet TAKE 1 TABLET BY  MOUTH DAILY 100 tablet 2   aspirin  EC 81 MG tablet Take  81 mg by mouth daily. Swallow whole.     Blood Glucose Monitoring Suppl (ONE TOUCH ULTRA 2) w/Device KIT USE TO CHECK BLOOD SUGAR AS DIRECTED 1 kit 0   calcium -vitamin D  (OSCAL WITH D) 500-5 MG-MCG tablet Take 1 tablet by mouth daily.     cholecalciferol  (VITAMIN D ) 1000 UNITS tablet Take 1,000 Units by mouth daily.     dicyclomine  (BENTYL ) 10 MG capsule Take 1 capsule (10 mg total) by mouth as needed for spasms. 60 capsule 0   furosemide  (LASIX ) 20 MG tablet TAKE 1 TABLET BY MOUTH DAILY AS  NEEDED 100 tablet 2   Lancets (ONETOUCH ULTRASOFT) lancets USE AS DIRECTED 3 TIMES  DAILY 300 each 3   losartan  (COZAAR ) 50 MG tablet TAKE 1 TABLET BY MOUTH TWICE  DAILY 200 tablet 2   metFORMIN  (GLUCOPHAGE -XR) 500 MG 24 hr tablet TAKE 1 TABLET BY MOUTH IN THE  MORNING AND 1 TABLET BY MOUTH AT BEDTIME 200 tablet 2   metoCLOPramide  (REGLAN ) 5 MG tablet TAKE 1 TABLET BY MOUTH TWICE  DAILY AS NEEDED 180 tablet 2   metoprolol  tartrate (LOPRESSOR ) 50 MG tablet TAKE 1 TABLET BY MOUTH TWICE  DAILY 200 tablet 3   ondansetron  (ZOFRAN ) 4 MG tablet Take 1 tablet (4 mg total) by mouth every 6 (six) hours as needed. 30 tablet 5   ONETOUCH ULTRA test strip CHECK BLOOD SUGAR 3 TIMES DAILY  OR AS NEEDED 300 strip 2   pantoprazole  (PROTONIX ) 40 MG tablet Take 1 tablet (40 mg total) by mouth daily. 90 tablet 3   potassium chloride  (KLOR-CON  M) 10 MEQ tablet TAKE 1 TABLET BY MOUTH DAILY 100 tablet 2   rosuvastatin  (CRESTOR ) 20 MG tablet TAKE 1 TABLET BY MOUTH DAILY 100 tablet 1   thiamine  (VITAMIN B-1) 100 MG tablet Take 100 mg by mouth every other day.     vitamin E 180 MG (400 UNITS) capsule Take 400 Units by mouth daily.     betamethasone  valerate ointment (VALISONE ) 0.1 % Apply a small amount to affected area topically every other day. (Patient not taking: Reported on 07/24/2023) 45 g 1   famotidine  (PEPCID ) 40 MG tablet Take 1 tablet (40 mg total) by mouth daily.  (Patient not taking: Reported on 07/24/2023) 30 tablet 4   hyoscyamine  (LEVSIN  SL) 0.125 MG SL tablet Place 1 tablet (0.125 mg total) under the tongue every 4 (four) hours as needed. (Patient not taking: Reported on 07/24/2023) 30 tablet 3   No facility-administered medications prior to visit.    Allergies  Allergen Reactions   Tramadol  Other (See Comments)    Dizziness     Review of Systems  Constitutional:  Positive for malaise/fatigue. Negative for fever.  HENT:  Negative for congestion.   Eyes:  Negative for blurred vision.  Respiratory:  Negative for shortness of breath.   Cardiovascular:  Negative for chest pain, palpitations and leg swelling.  Gastrointestinal:  Negative for abdominal pain, blood in stool and nausea.  Genitourinary:  Negative for dysuria and frequency.  Musculoskeletal:  Negative for falls.  Skin:  Negative for rash.  Neurological:  Negative for dizziness, loss of consciousness and headaches.  Endo/Heme/Allergies:  Negative for environmental allergies.  Psychiatric/Behavioral:  Negative for depression. The patient is not nervous/anxious.       Objective:    Physical Exam  LMP 02/07/1992  Wt Readings from Last 3 Encounters:  06/29/23 165 lb (74.8 kg)  06/26/23 163 lb (73.9 kg)  06/12/23 165 lb 3.2  oz (74.9 kg)       Assessment & Plan:  Renal insufficiency Assessment & Plan: Hydrate and monitor    Vitamin D  deficiency Assessment & Plan: Supplement and monitor       Assessment and Plan Assessment & Plan Type 2 Diabetes Mellitus A1c at 6.6% indicates good control. Postprandial glucose spikes above 200 mg/dL, especially after dinner. Metformin  beneficial for longevity and kidney protection. Discussed Jardiance for postprandial spikes; she prefers dietary management. - Refer to nutritionist for meal planning. - Advise pairing protein with carbohydrates and limiting carbohydrates to one fist-sized portion per meal. - Monitor blood glucose  levels regularly.  Chronic Kidney Disease GFR above 60, BUN and creatinine normal. Microalbumin creatinine ratio slightly elevated, consistent with age. Current medications, including losartan , protect kidney function. Advised to avoid high-dose NSAIDs and maintain hydration. - Encourage hydration with 5 ounces of water every hour or 10 ounces every two hours. - Avoid high-dose NSAIDs. - Monitor kidney function with blood tests a couple of times a year.  Hypercholesterolemia LDL at 94 mg/dL, total cholesterol at 161 mg/dL. As a diabetic, LDL should be below 70 mg/dL. Current treatment includes rosuvastatin  20 mg daily. She prefers lifestyle changes over increasing medication dosage. - Encourage exercise to help lower cholesterol levels. - Recheck blood work in 3-4 months to assess cholesterol levels. - Consider increasing rosuvastatin  dosage to 40 mg twice a week if lifestyle changes are insufficient.  Breast Cancer Significant side effects from previous cancer medications. Upcoming appointment with oncologist to discuss Aromasin, which may have different side effects. Concerned about quality of life due to past side effects. - Discuss potential new medication, Aromasin, with oncologist at the upcoming appointment. - Consider her quality of life and side effect profile when deciding on treatment.  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 Current symptoms and history warrant further investigation. - Follow up with gastroenterologist, Dr. Willy Harvest, to discuss the possibility of an upper endoscopy.     I discussed the assessment and treatment plan with the patient. The patient was provided an opportunity to ask questions and all were answered. The patient agreed with the plan and demonstrated an understanding of the instructions.   The patient was advised to call back or seek an in-person evaluation if the symptoms worsen or if the  condition fails to improve as anticipated.  Randie Bustle, MD [facility] [phone] (phone) [phone] (fax)  [facility]

## 2023-07-25 ENCOUNTER — Ambulatory Visit: Admitting: Family

## 2023-08-03 ENCOUNTER — Other Ambulatory Visit: Payer: Self-pay | Admitting: Family Medicine

## 2023-08-03 MED ORDER — ONETOUCH ULTRA VI STRP: ORAL_STRIP | 12 refills | Status: DC

## 2023-08-03 NOTE — Telephone Encounter (Signed)
 Copied from CRM [id number]. Topic: Clinical - Medication Refill >> Aug 03, 2023  8:03 AM Robinson H wrote: Medication: Blood Glucose Monitoring Suppl (ONE TOUCH ULTRA 2) w/Device KIT  Has the patient contacted their pharmacy? Yes, states pharmacy was supposed to reach out to office (Agent: If no, request that the patient contact the pharmacy for the refill. If patient does not wish to contact the pharmacy document the reason why and proceed with request.) (Agent: If yes, when and what did the pharmacy advise?)  This is the patient's preferred pharmacy:  [facility] - [place], [place] - [address] [place] [address] [place] [address] Phone: [phone] Fax: [phone]    Is this the correct pharmacy for this prescription? Yes If no, delete pharmacy and type the correct one.   Has the prescription been filled recently? No  Is the patient out of the medication? Yes, machine broken  Has the patient been seen for an appointment in the last year OR does the patient have an upcoming appointment? Yes  Can we respond through MyChart? Yes  Agent: Please be advised that Rx refills may take up to 3 business days. We ask that you follow-up with your pharmacy.

## 2023-08-06 ENCOUNTER — Telehealth: Payer: Self-pay

## 2023-08-06 NOTE — Telephone Encounter (Signed)
 Initial Comment Pt states she needs approval for one touch ultra for testing the blood sugar. Translation No Nurse Assessment Nurse: Michalec, RN, Darice Date/Time (Eastern Time): 08/03/2023 8:08:45 PM You have opened med assessment, do you wish to continue? ---Yes Please select the assessment type ---Refill Additional Documentation ---Patient needs a new one touch ultra because hers is not working. Does the patient have enough medication to last until the office opens? ---Unable to obtain loaner dose from Pharmacy Does the client directives allow for assistance with medications after hours? ---Yes Was the medication filled within the last 6 months? ---Yes What is the name of the medication, dose and instructions as listed on the bottle? ---One Touch Ultra Name of the physician as listed on the bottle. ---Dr Harlene Horton Pharmacy name and phone number where most recently filled. ---[facility] [phone] Disp. Time Titus Time) Disposition Final User 08/03/2023 8:37:48 PM Pharmacy Call Michalec, RN, Darice Reason: Called in script for replacement of One Touch Ultra blood sugar machine to [facility] at [phone] PLEASE NOTE: All timestamps contained within this report are represented as [place] Standard Time. CONFIDENTIALTY NOTICE: This fax transmission is intended only for the addressee. It contains information that is legally privileged, confidential or otherwise protected from use or disclosure. If you are not the intended recipient, you are strictly prohibited from reviewing, disclosing, copying using or disseminating any of this information or taking any action in reliance on or regarding this information. If you have received this fax in error, please notify us  immediately by telephone so that we can arrange for its return to us . Phone: [phone], Toll-Free: [phone], Fax: [phone] [facility] [place] 01/06/1941 Page: 1 of4 CallId: [id number] Disp. Time Titus Time)  Disposition Final User 08/03/2023 8:37:56 PM Clinical Call Yes Michalec, RN, Darice Final Disposition 08/03/2023 8:37:56 PM Clinical Call Yes Michalec, RN, Darice Beath Orders/Maintenance Medications Medication Refill Route Dosage Regime Duration Admin Instructions User Name One Touch Ultra N/A Test Blood sugar as directed by Dr Horton - NPI [id number] Michalec, RN, Darice

## 2023-08-07 ENCOUNTER — Encounter: Payer: Self-pay | Admitting: Family

## 2023-08-09 ENCOUNTER — Inpatient Hospital Stay: Attending: Hematology & Oncology

## 2023-08-09 ENCOUNTER — Ambulatory Visit: Payer: Self-pay | Admitting: Hematology & Oncology

## 2023-08-09 ENCOUNTER — Encounter: Payer: Self-pay | Admitting: Hematology & Oncology

## 2023-08-09 ENCOUNTER — Inpatient Hospital Stay: Admitting: Hematology & Oncology

## 2023-08-09 VITALS — BP 121/51 | HR 64 | Temp 98.8°F | Resp 20 | Ht 61.0 in | Wt 166.1 lb

## 2023-08-09 DIAGNOSIS — Z1732 Human epidermal growth factor receptor 2 negative status: Secondary | ICD-10-CM | POA: Insufficient documentation

## 2023-08-09 DIAGNOSIS — K909 Intestinal malabsorption, unspecified: Secondary | ICD-10-CM

## 2023-08-09 DIAGNOSIS — Z170 Estrogen receptor positive status [ER+]: Secondary | ICD-10-CM | POA: Insufficient documentation

## 2023-08-09 DIAGNOSIS — Z1721 Progesterone receptor positive status: Secondary | ICD-10-CM | POA: Insufficient documentation

## 2023-08-09 DIAGNOSIS — Z79811 Long term (current) use of aromatase inhibitors: Secondary | ICD-10-CM | POA: Insufficient documentation

## 2023-08-09 DIAGNOSIS — E119 Type 2 diabetes mellitus without complications: Secondary | ICD-10-CM | POA: Insufficient documentation

## 2023-08-09 DIAGNOSIS — D509 Iron deficiency anemia, unspecified: Secondary | ICD-10-CM | POA: Insufficient documentation

## 2023-08-09 DIAGNOSIS — E1169 Type 2 diabetes mellitus with other specified complication: Secondary | ICD-10-CM

## 2023-08-09 DIAGNOSIS — C50411 Malignant neoplasm of upper-outer quadrant of right female breast: Secondary | ICD-10-CM | POA: Insufficient documentation

## 2023-08-09 LAB — IRON AND IRON BINDING CAPACITY (CC-WL,HP ONLY)
Iron: 64 ug/dL (ref 28–170)
Saturation Ratios: 17 % (ref 10.4–31.8)
TIBC: 377 ug/dL (ref 250–450)
UIBC: 313 ug/dL (ref 148–442)

## 2023-08-09 LAB — CBC WITH DIFFERENTIAL (CANCER CENTER ONLY)
Abs Immature Granulocytes: 0.01 10*3/uL (ref 0.00–0.07)
Basophils Absolute: 0.1 10*3/uL (ref 0.0–0.1)
Basophils Relative: 1 %
Eosinophils Absolute: 0.1 10*3/uL (ref 0.0–0.5)
Eosinophils Relative: 1 %
HCT: 35.3 % — ABNORMAL LOW (ref 36.0–46.0)
Hemoglobin: 11.8 g/dL — ABNORMAL LOW (ref 12.0–15.0)
Immature Granulocytes: 0 %
Lymphocytes Relative: 35 %
Lymphs Abs: 2.9 10*3/uL (ref 0.7–4.0)
MCH: 28 pg (ref 26.0–34.0)
MCHC: 33.4 g/dL (ref 30.0–36.0)
MCV: 83.6 fL (ref 80.0–100.0)
Monocytes Absolute: 0.7 10*3/uL (ref 0.1–1.0)
Monocytes Relative: 9 %
Neutro Abs: 4.6 10*3/uL (ref 1.7–7.7)
Neutrophils Relative %: 54 %
Platelet Count: 218 10*3/uL (ref 150–400)
RBC: 4.22 MIL/uL (ref 3.87–5.11)
RDW: 12.2 % (ref 11.5–15.5)
WBC Count: 8.3 10*3/uL (ref 4.0–10.5)
nRBC: 0 % (ref 0.0–0.2)

## 2023-08-09 LAB — CMP (CANCER CENTER ONLY)
ALT: 19 U/L (ref 0–44)
AST: 20 U/L (ref 15–41)
Albumin: 4.6 g/dL (ref 3.5–5.0)
Alkaline Phosphatase: 64 U/L (ref 38–126)
Anion gap: 11 (ref 5–15)
BUN: 19 mg/dL (ref 8–23)
CO2: 23 mmol/L (ref 22–32)
Calcium: 10 mg/dL (ref 8.9–10.3)
Chloride: 98 mmol/L (ref 98–111)
Creatinine: 0.9 mg/dL (ref 0.44–1.00)
GFR, Estimated: >60 mL/min (ref >60)
Glucose, Bld: 136 mg/dL — ABNORMAL HIGH (ref 70–99)
Potassium: 4.3 mmol/L (ref 3.5–5.1)
Sodium: 132 mmol/L — ABNORMAL LOW (ref 135–145)
Total Bilirubin: 0.7 mg/dL (ref 0.0–1.2)
Total Protein: 7.5 g/dL (ref 6.5–8.1)

## 2023-08-09 LAB — FERRITIN: Ferritin: 229 ng/mL (ref 11–307)

## 2023-08-09 LAB — HEMOGLOBIN A1C
Hgb A1c MFr Bld: 6.3 % — ABNORMAL HIGH (ref 4.8–5.6)
Mean Plasma Glucose: 134 mg/dL

## 2023-08-09 NOTE — Progress Notes (Signed)
 Hematology and Oncology Follow Up Visit  [name] [id number] July 18, 1940 83 y.o. 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 daily - d/c on 05/24/2023 Feraheme  --  last given 04/19/2020     Interim History:  [name] is back for follow-up.  She feels a lot better.  We have her off an aromatase inhibitor.  She prefers not to go back onto an aromatase inhibitor.  I think we can certainly accommodate her.  She has a early stage breast cancer with a low Oncotype score.   I think that we can watch her very closely.  I think that the chance of her cancer recurrent is still incredibly low.  Again she has a better quality of life now.  She feels better.  She is able to do more.  She has had no problems with nausea or vomiting.  She has had no change in bowel or bladder habits.  Her last iron  studies that were done back in May showed a ferritin of 260 with an iron  saturation of 21%.  Overall, I would have to say that her performance status is probably ECOG 1.    Medications:  Current Outpatient Medications:    acetaminophen  (TYLENOL ) 325 MG tablet, Take 2 tablets (650 mg total) by mouth every 6 (six) hours as needed for up to 30 doses for mild pain or moderate pain., Disp: 30 tablet, Rfl: 0   amLODipine  (NORVASC ) 5 MG tablet, TAKE 1 TABLET BY MOUTH DAILY, Disp: 100 tablet, Rfl: 2   aspirin  EC 81 MG tablet, Take 81 mg by mouth daily. Swallow whole., Disp: , Rfl:    Blood Glucose Monitoring Suppl (ONE TOUCH ULTRA 2) w/Device KIT, USE TO CHECK BLOOD SUGAR AS DIRECTED, Disp: 1 kit, Rfl: 0   calcium -vitamin D  (OSCAL WITH D) 500-5 MG-MCG tablet, Take 1 tablet by mouth daily., Disp: , Rfl:    cholecalciferol   (VITAMIN D ) 1000 UNITS tablet, Take 1,000 Units by mouth daily., Disp: , Rfl:    dicyclomine  (BENTYL ) 10 MG capsule, Take 1 capsule (10 mg total) by mouth as needed for spasms., Disp: 60 capsule, Rfl: 0   glucose blood (ONETOUCH ULTRA) test strip, CHECK BLOOD SUGAR 3 TIMES  DAILY OR AS NEEDED., Disp: 300 strip, Rfl: 12   Lancets (ONETOUCH ULTRASOFT) lancets, USE AS DIRECTED 3 TIMES  DAILY, Disp: 300 each, Rfl: 3   losartan  (COZAAR ) 50 MG tablet, TAKE 1 TABLET BY MOUTH TWICE  DAILY, Disp: 200 tablet, Rfl: 2   metFORMIN  (GLUCOPHAGE -XR) 500 MG 24 hr tablet, TAKE 1 TABLET BY MOUTH IN THE  MORNING AND 1 TABLET BY MOUTH AT BEDTIME, Disp: 200 tablet, Rfl: 2   metoCLOPramide  (REGLAN ) 5 MG tablet, TAKE 1 TABLET BY MOUTH TWICE  DAILY AS NEEDED, Disp: 180 tablet, Rfl: 2   metoprolol  tartrate (LOPRESSOR ) 50 MG tablet, TAKE 1 TABLET BY MOUTH TWICE  DAILY, Disp: 200 tablet, Rfl: 3   ondansetron  (ZOFRAN ) 4 MG tablet, Take 1 tablet (4 mg total) by mouth every 6 (six) hours as needed., Disp: 30 tablet, Rfl: 5   pantoprazole  (PROTONIX ) 40 MG tablet, Take 1 tablet (40 mg total) by mouth daily., Disp: 90 tablet, Rfl: 3   potassium chloride  (KLOR-CON   M) 10 MEQ tablet, TAKE 1 TABLET BY MOUTH DAILY, Disp: 100 tablet, Rfl: 2   rosuvastatin  (CRESTOR ) 20 MG tablet, TAKE 1 TABLET BY MOUTH DAILY, Disp: 100 tablet, Rfl: 1   thiamine  (VITAMIN B-1) 100 MG tablet, Take 100 mg by mouth every other day., Disp: , Rfl:    vitamin E 180 MG (400 UNITS) capsule, Take 400 Units by mouth daily., Disp: , Rfl:    famotidine  (PEPCID ) 40 MG tablet, Take 1 tablet (40 mg total) by mouth daily. (Patient not taking: Reported on 08/09/2023), Disp: 30 tablet, Rfl: 4   furosemide  (LASIX ) 20 MG tablet, TAKE 1 TABLET BY MOUTH DAILY AS  NEEDED (Patient not taking: Reported on 08/09/2023), Disp: 100 tablet, Rfl: 2   hyoscyamine  (LEVSIN  SL) 0.125 MG SL tablet, Place 1 tablet (0.125 mg total) under the tongue every 4 (four) hours as needed. (Patient not taking:  Reported on 08/09/2023), Disp: 30 tablet, Rfl: 3  Allergies:  Allergies  Allergen Reactions   Tramadol  Other (See Comments)    Dizziness     Past Medical History, Surgical history, Social history, and Family History were reviewed and updated.  Review of Systems: Review of Systems  Constitutional: Negative.   HENT:  Negative.    Eyes: Negative.   Respiratory: Negative.    Cardiovascular: Negative.   Gastrointestinal: Negative.   Endocrine: Negative.   Genitourinary: Negative.    Musculoskeletal: Negative.   Skin: Negative.   Neurological: Negative.   Hematological: Negative.   Psychiatric/Behavioral: Negative.      Physical Exam:  height is 5' 1 (1.549 m) and weight is 166 lb 1.9 oz (75.4 kg). Her oral temperature is 98.8 F (37.1 C). Her blood pressure is 121/51 (abnormal) and her pulse is 64. Her respiration is 20 and oxygen saturation is 100%.   Wt Readings from Last 3 Encounters:  08/09/23 166 lb 1.9 oz (75.4 kg)  06/29/23 165 lb (74.8 kg)  06/26/23 163 lb (73.9 kg)    Physical Exam Vitals reviewed.  Constitutional:      Comments: Left breast with no masses, edema or erythema.  There is no left axillary adenopathy.  Right breast shows the lumpectomy incision  at about the 9 o'clock position.   There is no distinct mass.  There is no palpable right axillary adenopathy.  HENT:     Head: Normocephalic and atraumatic.  Eyes:     Pupils: Pupils are equal, round, and reactive to light.  Cardiovascular:     Rate and Rhythm: Normal rate and regular rhythm.     Heart sounds: Normal heart sounds.  Pulmonary:     Effort: Pulmonary effort is normal.     Breath sounds: Normal breath sounds.  Abdominal:     General: Bowel sounds are normal.     Palpations: Abdomen is soft.  Musculoskeletal:        General: No tenderness or deformity. Normal range of motion.     Cervical back: Normal range of motion.  Lymphadenopathy:     Cervical: No cervical adenopathy.  Skin:     General: Skin is warm and dry.     Findings: No erythema or rash.  Neurological:     Mental Status: She is alert and oriented to person, place, and time.  Psychiatric:        Behavior: Behavior normal.        Thought Content: Thought content normal.        Judgment: Judgment normal.     Lab  Results  Component Value Date   WBC 8.3 08/09/2023   HGB 11.8 (L) 08/09/2023   HCT 35.3 (L) 08/09/2023   MCV 83.6 08/09/2023   PLT 218 08/09/2023     Chemistry      Component Value Date/Time   NA 132 (L) 08/09/2023 1010   NA 136 05/08/2016 1305   NA 134 (L) 07/08/2015 1149   K 4.3 08/09/2023 1010   K 4.2 05/08/2016 1305   K 4.4 07/08/2015 1149   CL 98 08/09/2023 1010   CL 102 05/08/2016 1305   CO2 23 08/09/2023 1010   CO2 26 05/08/2016 1305   CO2 25 07/08/2015 1149   BUN 19 08/09/2023 1010   BUN 16 05/08/2016 1305   BUN 15.5 07/08/2015 1149   CREATININE 0.90 08/09/2023 1010   CREATININE 0.81 07/19/2023 0938   CREATININE 0.8 07/08/2015 1149      Component Value Date/Time   CALCIUM  10.0 08/09/2023 1010   CALCIUM  9.4 05/08/2016 1305   CALCIUM  10.0 07/08/2015 1149   ALKPHOS 64 08/09/2023 1010   ALKPHOS 56 05/08/2016 1305   ALKPHOS 62 07/08/2015 1149   AST 20 08/09/2023 1010   AST 24 07/08/2015 1149   ALT 19 08/09/2023 1010   ALT 29 05/08/2016 1305   ALT 30 07/08/2015 1149   BILITOT 0.7 08/09/2023 1010   BILITOT 0.64 07/08/2015 1149      Impression and Plan: [name] is a very charming and very youthful appearing 83 year old [place] female.  She has history of stage I infiltrating ductal carcinoma of the right breast.  She had a low Oncotype score.  She did not require radiotherapy.  For right now, we will just hold the aromatase inhibitor.  We will just watch her.  We will plan to get her back in a couple months.  If all looks good in 2 months, then I think we can get her back every 3 months.  Will have to watch her iron  studies.  Her iron  saturation was little on the  lower side.  And we will have to be careful with this.     Maude JONELLE Crease, MD 7/3/202511:13 AM

## 2023-08-13 ENCOUNTER — Encounter: Payer: Self-pay | Admitting: *Deleted

## 2023-08-13 ENCOUNTER — Telehealth: Payer: Self-pay | Admitting: Internal Medicine

## 2023-08-13 ENCOUNTER — Telehealth: Payer: Self-pay | Admitting: *Deleted

## 2023-08-13 DIAGNOSIS — K317 Polyp of stomach and duodenum: Secondary | ICD-10-CM

## 2023-08-13 DIAGNOSIS — R1013 Epigastric pain: Secondary | ICD-10-CM

## 2023-08-13 NOTE — Telephone Encounter (Signed)
 Patient approved for Feraheme  and Venofer  per Alfonso Cory.

## 2023-08-13 NOTE — Telephone Encounter (Signed)
 PT is requesting to speak with a nurse about her symptoms. She is still having pain and issues with her stomach. She stated she was told she could have an EGD and wanted to confirm to schedule it. Please advise.

## 2023-08-13 NOTE — Telephone Encounter (Signed)
 I called her.  She is having postprandial epigastric pain.  Otherwise I think she is medically stable.  Wt Readings from Last 3 Encounters:  08/09/23 166 lb 1.9 oz (75.4 kg)  06/29/23 165 lb (74.8 kg)  06/26/23 163 lb (73.9 kg)   Please schedule direct EGD for reasons below  Encounter Diagnoses  Name Primary?   Abdominal pain, epigastric Yes   Gastric polyps

## 2023-08-13 NOTE — Telephone Encounter (Signed)
 Called the patient to discuss. No answer. Left message of my call.

## 2023-08-13 NOTE — Telephone Encounter (Signed)
 Patient states her stomach still hurting and I cannot eat many things. She will be getting an iron  infusion by oncology. She has decided she does want to go through with an EGD. Can this be scheduled? Last seen in March 2025.

## 2023-08-14 NOTE — Telephone Encounter (Signed)
Patient scheduled for pre visit and procedure.

## 2023-08-16 ENCOUNTER — Inpatient Hospital Stay

## 2023-08-16 VITALS — BP 140/58 | HR 62 | Temp 98.2°F | Resp 17

## 2023-08-16 DIAGNOSIS — Z79811 Long term (current) use of aromatase inhibitors: Secondary | ICD-10-CM | POA: Diagnosis not present

## 2023-08-16 DIAGNOSIS — K909 Intestinal malabsorption, unspecified: Secondary | ICD-10-CM

## 2023-08-16 DIAGNOSIS — D509 Iron deficiency anemia, unspecified: Secondary | ICD-10-CM | POA: Diagnosis not present

## 2023-08-16 DIAGNOSIS — Z1732 Human epidermal growth factor receptor 2 negative status: Secondary | ICD-10-CM | POA: Diagnosis not present

## 2023-08-16 DIAGNOSIS — E119 Type 2 diabetes mellitus without complications: Secondary | ICD-10-CM | POA: Diagnosis not present

## 2023-08-16 DIAGNOSIS — D500 Iron deficiency anemia secondary to blood loss (chronic): Secondary | ICD-10-CM

## 2023-08-16 DIAGNOSIS — Z1721 Progesterone receptor positive status: Secondary | ICD-10-CM | POA: Diagnosis not present

## 2023-08-16 DIAGNOSIS — C50411 Malignant neoplasm of upper-outer quadrant of right female breast: Secondary | ICD-10-CM | POA: Diagnosis not present

## 2023-08-16 DIAGNOSIS — Z170 Estrogen receptor positive status [ER+]: Secondary | ICD-10-CM | POA: Diagnosis not present

## 2023-08-16 MED ORDER — SODIUM CHLORIDE 0.9 % IV SOLN: INTRAVENOUS | Status: DC

## 2023-08-16 MED ORDER — IRON SUCROSE 20 MG/ML IV SOLN
200.0000 mg | Freq: Once | INTRAVENOUS | Status: AC
  Administered 2023-08-16: 200 mg via INTRAVENOUS
  Filled 2023-08-16: qty 10

## 2023-08-16 NOTE — Patient Instructions (Signed)

## 2023-08-17 ENCOUNTER — Encounter: Payer: Self-pay | Admitting: Internal Medicine

## 2023-08-17 ENCOUNTER — Ambulatory Visit (AMBULATORY_SURGERY_CENTER)

## 2023-08-17 VITALS — Ht 61.0 in | Wt 165.0 lb

## 2023-08-17 DIAGNOSIS — R1013 Epigastric pain: Secondary | ICD-10-CM

## 2023-08-17 DIAGNOSIS — K317 Polyp of stomach and duodenum: Secondary | ICD-10-CM

## 2023-08-17 NOTE — Progress Notes (Signed)
 No egg or soy allergy known to patient  No issues known to pt with past sedation with any surgeries or procedures Patient denies ever being told they had issues or difficulty with intubation  No FH of Malignant Hyperthermia Pt is not on diet pills Pt is not on  home 02  Pt is not on blood thinners  Pt intermittent issues with constipation and takes Miralax  No A fib or A flutter Have any cardiac testing pending--No; 6 month f/u Pt can ambulate  Pt denies use of chewing tobacco Discussed diabetic I weight loss medication holds Discussed NSAID holds Checked BMI Pt instructed to use [email or web address] or GoodRx for a price reduction on prep  Pre visit completed

## 2023-08-19 NOTE — Progress Notes (Unsigned)
 Cardiology Office Note:   Date:  08/21/2023  ID:  [name], DOB 12/22/1940, MRN [id number] PCP: Domenica Harlene LABOR, MD  [facility] Providers Cardiologist:  Lynwood Schilling, MD {  History of Present Illness:   [name] is a 83 y.o. female who presents for followup of SVT. And evaluation of chest pain.   Since I saw her she has had treatment for breast cancer.  She had lumpectomy.  She is being managed conservatively.   She did have an episode of chest discomfort.  This was a few weeks ago.  It seemed to last constantly for about 2 weeks.  It might go away when she fell asleep.  However, she would have this vague discomfort under her chest.  She said it would get to 6 out of 10 in intensity.  She might have some left arm discomfort.  She did go grocery shopping during that time which is her most significant activity and she did not notice that this made it worse.  It slowly went away.  She has not had any recurrence since then.  She is actually due to get a GI evaluation with an upper GI.  She has not had associated nausea vomiting or diaphoresis.  She has not had any new shortness of breath, PND or orthopnea.  She is chronically fatigued.  She is not having any new palpitations, presyncope or syncope.  She has had no weight gain or new edema.  ROS: As stated in the HPI and negative for all other systems.  Studies Reviewed:    EKG:   EKG Interpretation Date/Time:  Tuesday August 21 2023 14:05:59 EDT Ventricular Rate:  62 PR Interval:  226 QRS Duration:  66 QT Interval:  382 QTC Calculation: 387 R Axis:   -5  Text Interpretation: Sinus rhythm with 1st degree A-V block Inferior infarct , age undetermined When compared with ECG of 06-Dec-2022 18:58, No significant change since last tracing Confirmed by Schilling Lynwood ([id number]) on 08/21/2023 2:09:57 PM     Risk Assessment/Calculations:              Physical Exam:   VS:  BP 128/70 (BP Location: Right Arm, Patient  Position: Sitting, Cuff Size: Normal)   Pulse 65   Ht 5' 1 (1.549 m)   Wt 166 lb 6.4 oz (75.5 kg)   LMP 02/07/1992   SpO2 98%   BMI 31.44 kg/m    Wt Readings from Last 3 Encounters:  08/21/23 166 lb 6.4 oz (75.5 kg)  08/17/23 165 lb (74.8 kg)  08/09/23 166 lb 1.9 oz (75.4 kg)     GEN: Well nourished, well developed in no acute distress NECK: No JVD; No carotid bruits CARDIAC: RRR, no murmurs, rubs, gallops RESPIRATORY:  Clear to auscultation without rales, wheezing or rhonchi  ABDOMEN: Soft, non-tender, non-distended EXTREMITIES:  No edema; No deformity   ASSESSMENT AND PLAN:   Chest pain : Her chest discomfort has nonanginal greater than anginal features.  She is due to have an upper GI.  If she has no clear etiology for chest discomfort and she has recurrent symptoms (which has since completely resolved) she is going to let me know and we would do further testing.  I have a low suspicion for obstructive coronary disease however.  She will continue with primary risk reduction.   SVT: She has had no new symptoms.  No change in therapy.       Follow up with me in 1  year or sooner if needed.  Signed, Lynwood Schilling, MD

## 2023-08-21 ENCOUNTER — Ambulatory Visit: Attending: Cardiology | Admitting: Cardiology

## 2023-08-21 ENCOUNTER — Encounter: Payer: Self-pay | Admitting: Cardiology

## 2023-08-21 VITALS — BP 128/70 | HR 65 | Ht 61.0 in | Wt 166.4 lb

## 2023-08-21 DIAGNOSIS — I471 Supraventricular tachycardia, unspecified: Secondary | ICD-10-CM

## 2023-08-21 DIAGNOSIS — I6529 Occlusion and stenosis of unspecified carotid artery: Secondary | ICD-10-CM

## 2023-08-21 DIAGNOSIS — R072 Precordial pain: Secondary | ICD-10-CM | POA: Diagnosis not present

## 2023-08-21 NOTE — Patient Instructions (Signed)
 Medication Instructions:  No changes   *If you need a refill on your cardiac medications before your next appointment, please call your pharmacy*  Follow-Up: At [facility], you and your health needs are our priority.  As part of our continuing mission to provide you with exceptional heart care, our providers are all part of one team.  This team includes your primary Cardiologist (physician) and Advanced Practice Providers or APPs (Physician Assistants and Nurse Practitioners) who all work together to provide you with the care you need, when you need it.  Your next appointment:   1 year(s)  Provider:   Lynwood Schilling, MD    We recommend signing up for the patient portal called MyChart.  Sign up information is provided on this After Visit Summary.  MyChart is used to connect with patients for Virtual Visits (Telemedicine).  Patients are able to view lab/test results, encounter notes, upcoming appointments, etc.  Non-urgent messages can be sent to your provider as well.   To learn more about what you can do with MyChart, go to [email or web address].

## 2023-08-28 ENCOUNTER — Ambulatory Visit (AMBULATORY_SURGERY_CENTER): Admitting: Internal Medicine

## 2023-08-28 ENCOUNTER — Encounter: Payer: Self-pay | Admitting: Internal Medicine

## 2023-08-28 VITALS — BP 124/64 | HR 62 | Temp 97.7°F | Resp 18 | Ht 61.0 in | Wt 165.0 lb

## 2023-08-28 DIAGNOSIS — K317 Polyp of stomach and duodenum: Secondary | ICD-10-CM | POA: Diagnosis not present

## 2023-08-28 DIAGNOSIS — K2970 Gastritis, unspecified, without bleeding: Secondary | ICD-10-CM | POA: Diagnosis not present

## 2023-08-28 DIAGNOSIS — R1013 Epigastric pain: Secondary | ICD-10-CM | POA: Diagnosis not present

## 2023-08-28 DIAGNOSIS — K3189 Other diseases of stomach and duodenum: Secondary | ICD-10-CM | POA: Diagnosis not present

## 2023-08-28 MED ORDER — SODIUM CHLORIDE 0.9 % IV SOLN: 500.0000 mL | Freq: Once | INTRAVENOUS | Status: DC

## 2023-08-28 NOTE — Progress Notes (Signed)
 Sedate, gd SR, tolerated procedure well, VSS, report to RN

## 2023-08-28 NOTE — Progress Notes (Signed)
 Called to room to assist during endoscopic procedure.  Patient ID and intended procedure confirmed with present staff. Received instructions for my participation in the procedure from the performing physician.

## 2023-08-28 NOTE — Patient Instructions (Addendum)
 One area of the stomach looks irritated and inflamed. I took biopsies of the stomach to understand things.  The polyps remain small and look unchanged.  I did not see any signs of tumors or cancer.  I appreciate the opportunity to care for you. Lupita CHARLENA Commander, MD, FACG   YOU HAD AN ENDOSCOPIC PROCEDURE TODAY AT THE  ENDOSCOPY CENTER:   Refer to the procedure report that was given to you for any specific questions about what was found during the examination.  If the procedure report does not answer your questions, please call your gastroenterologist to clarify.  If you requested that your care partner not be given the details of your procedure findings, then the procedure report has been included in a sealed envelope for you to review at your convenience later.  YOU SHOULD EXPECT: Some feelings of bloating in the abdomen. Passage of more gas than usual.  Walking can help get rid of the air that was put into your GI tract during the procedure and reduce the bloating. If you had a lower endoscopy (such as a colonoscopy or flexible sigmoidoscopy) you may notice spotting of blood in your stool or on the toilet paper. If you underwent a bowel prep for your procedure, you may not have a normal bowel movement for a few days.  Please Note:  You might notice some irritation and congestion in your nose or some drainage.  This is from the oxygen used during your procedure.  There is no need for concern and it should clear up in a day or so.  SYMPTOMS TO REPORT IMMEDIATELY:  Following upper endoscopy (EGD)  Vomiting of blood or coffee ground material  New chest pain or pain under the shoulder blades  Painful or persistently difficult swallowing  New shortness of breath  Fever of 100F or higher  Black, tarry-looking stools  For urgent or emergent issues, a gastroenterologist can be reached at any hour by calling [phone]. Do not use MyChart messaging for urgent concerns.    DIET:  We  do recommend a small meal at first, but then you may proceed to your regular diet.  Drink plenty of fluids but you should avoid alcoholic beverages for 24 hours.  ACTIVITY:  You should plan to take it easy for the rest of today and you should NOT DRIVE or use heavy machinery until tomorrow (because of the sedation medicines used during the test).    FOLLOW UP: Our staff will call the number listed on your records the next business day following your procedure.  We will call around 7:15- 8:00 am to check on you and address any questions or concerns that you may have regarding the information given to you following your procedure. If we do not reach you, we will leave a message.     If any biopsies were taken you will be contacted by phone or by letter within the next 1-3 weeks.  Please call us  at [phone] if you have not heard about the biopsies in 3 weeks.    SIGNATURES/CONFIDENTIALITY: You and/or your care partner have signed paperwork which will be entered into your electronic medical record.  These signatures attest to the fact that that the information above on your After Visit Summary has been reviewed and is understood.  Full responsibility of the confidentiality of this discharge information lies with you and/or your care-partner.

## 2023-08-28 NOTE — Progress Notes (Signed)
 [facility] History and Physical   Primary Care Physician:  Domenica Harlene LABOR, MD   Reason for Procedure:    Encounter Diagnoses  Name Primary?   Abdominal pain, epigastric Yes   Gastric polyps      Plan:    EGD     HPI: [name] is a 83 y.o. female w/ epigastric pain problems and numerous gastric polyps. Also some early satiety.Top reassess polyps today.  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 09/05/2006   Qualifier: Diagnosis of  By: Wilhemina RMA, Lucy     Diabetic peripheral neuropathy (HCC) 10/29/2013   Encounter for preventative adult health care exam with abnormal findings 09/14/2013   Esophageal reflux    Gastric polyp    Fundic Gland   Gastroparesis    Headache(784.0)    Heart murmur    Echocardiogram 2/11: EF 60-65%, mild LAE, grade 1 diastolic dysfunction, aortic valve sclerosis, mean gradient 9 mm of mercury, PASP 34   Hematuria 03/16/2016   Iron  deficiency anemia, unspecified    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   Leg swelling    bilateral   Neck pain 04/22/2015   PONV (postoperative nausea and vomiting)    pt states body temperature drops every time she has anesthesia; pt states only needs small amount of anesthesia   PSVT (paroxysmal supraventricular tachycardia) (HCC)    Pure hypercholesterolemia    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   Renal insufficiency 06/18/2019   pt states  L kidney function very low-functions at about 20%   Stroke [facility])    tia, 2014   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   Type II or unspecified type diabetes mellitus without mention of complication, not stated as uncontrolled    Unspecified essential hypertension    Unspecified hereditary and idiopathic peripheral neuropathy 10/29/2013   Vitamin D  deficiency     Past Surgical History:  Procedure Laterality Date   BREAST BIOPSY Right 06/13/2022   US  RT BREAST BX W LOC DEV 1ST LESION IMG BX SPEC US  GUIDE 06/13/2022 [facility] MAMMOGRAP[facility]   BREAST LUMPECTOMY Right 07/31/2022   Procedure: RIGHT BREAST LUMPECTOMY;  Surgeon: Vernetta Berg, MD;  Location: [facility];  Service: General;  Laterality: Right;   CHOLECYSTECTOMY  1993   COLONOSCOPY  11/11/2010   diverticulosis   DILATATION & CURRETTAGE/HYSTEROSCOPY WITH RESECTOCOPE N/A 02/25/2013   Procedure: Attempted hysteroscopy with uterine perforation;  Surgeon: Bobie FORBES Crown de Charlynn FORBES Cary, MD;  Location: [facility];  Service: Gynecology;  Laterality: N/A;   ESOPHAGOGASTRODUODENOSCOPY  08/29/2010; 09/15/2010   Carcinoid tumor less than 1 cm in July 2012 not seen in August 2012 , gastritis, fundic gland polyps   ESOPHAGOGASTRODUODENOSCOPY  05/16/2011   ESOPHAGOGASTRODUODENOSCOPY  06/14/2012   EUS  12/15/2010   Procedure: UPPER ENDOSCOPIC ULTRASOUND (EUS) LINEAR;  Surgeon: Toribio Cedar, MD;  Location: [facility];  Service: Endoscopy;  Laterality:  N/A;   EYE SURGERY Bilateral    Bi lateral cateracts and bi lateral laser   LAPAROSCOPY N/A 02/25/2013   Procedure: Cystoscopy and laparoscopy with fulguration of uterine serosa;  Surgeon: Bobie FORBES Crown de Charlynn FORBES Cary, MD;  Location: [facility];  Service: Gynecology;  Laterality: N/A;   TONSILLECTOMY       Current Outpatient Medications  Medication Sig Dispense Refill   acetaminophen  (TYLENOL ) 325 MG tablet Take 2 tablets (650 mg total) by mouth every 6 (six) hours as needed for up to 30 doses for mild pain or moderate pain. 30 tablet 0   amLODipine  (NORVASC ) 5 MG tablet TAKE 1 TABLET  BY MOUTH DAILY 100 tablet 2   aspirin  EC 81 MG tablet Take 81 mg by mouth daily. Swallow whole.     Blood Glucose Monitoring Suppl (ONE TOUCH ULTRA 2) w/Device KIT USE TO CHECK BLOOD SUGAR AS DIRECTED 1 kit 0   dicyclomine  (BENTYL ) 10 MG capsule Take 1 capsule (10 mg total) by mouth as needed for spasms. 60 capsule 0   furosemide  (LASIX ) 20 MG tablet TAKE 1 TABLET BY MOUTH DAILY AS  NEEDED 100 tablet 2   glucose blood (ONETOUCH ULTRA) test strip CHECK BLOOD SUGAR 3 TIMES  DAILY OR AS NEEDED. 300 strip 12   Lancets (ONETOUCH ULTRASOFT) lancets USE AS DIRECTED 3 TIMES  DAILY 300 each 3   losartan  (COZAAR ) 50 MG tablet TAKE 1 TABLET BY MOUTH TWICE  DAILY 200 tablet 2   metFORMIN  (GLUCOPHAGE -XR) 500 MG 24 hr tablet TAKE 1 TABLET BY MOUTH IN THE  MORNING AND 1 TABLET BY MOUTH AT BEDTIME 200 tablet 2   metoCLOPramide  (REGLAN ) 5 MG tablet TAKE 1 TABLET BY MOUTH TWICE  DAILY AS NEEDED 180 tablet 2   metoprolol  tartrate (LOPRESSOR ) 50 MG tablet TAKE 1 TABLET BY MOUTH TWICE  DAILY 200 tablet 3   pantoprazole  (PROTONIX ) 40 MG tablet Take 1 tablet (40 mg total) by mouth daily. 90 tablet 3   potassium chloride  (KLOR-CON  M) 10 MEQ tablet TAKE 1 TABLET BY MOUTH DAILY 100 tablet 2   rosuvastatin  (CRESTOR ) 20 MG tablet TAKE 1 TABLET BY MOUTH DAILY 100 tablet 1   calcium -vitamin D  (OSCAL WITH D) 500-5 MG-MCG tablet Take 1 tablet by mouth daily.     cholecalciferol  (VITAMIN D ) 1000 UNITS tablet Take 1,000 Units by mouth daily.     famotidine  (PEPCID ) 40 MG tablet Take 1 tablet (40 mg total) by mouth daily. 30 tablet 4   hyoscyamine  (LEVSIN  SL) 0.125 MG SL tablet Place 1 tablet (0.125 mg total) under the tongue every 4 (four) hours as needed. (Patient not taking: Reported on 07/24/2023) 30 tablet 3   ondansetron  (ZOFRAN ) 4 MG tablet Take 1 tablet (4 mg total) by mouth every 6 (six) hours as needed. (Patient not taking: Reported on 08/21/2023) 30 tablet 5   thiamine  (VITAMIN B-1) 100 MG tablet Take 100 mg by mouth every  other day.     vitamin E 180 MG (400 UNITS) capsule Take 400 Units by mouth daily.     Current Facility-Administered Medications  Medication Dose Route Frequency Provider Last Rate Last Admin   0.9 %  sodium chloride  infusion  500 mL Intravenous Once Avram Lupita FORBES, MD        Allergies as of 08/28/2023 - Review Complete 08/28/2023  Allergen Reaction Noted   Tramadol  Other (See Comments) 01/22/2012    Family History  Problem Relation Age of Onset   Diabetes  Mother    Stroke Father        deceased age 70   Heart disease Sister        deceased MI age 43   Diabetes Sister    Heart disease Sister    Hypertension Sister    Hyperlipidemia Sister    Diabetes Sister    Heart disease Sister    Hypertension Sister    Hyperlipidemia Sister    Heart disease Brother        deceased MI age 47   Intellectual disability Brother    Diabetes Brother    Heart disease Brother    Hypertension Brother    Hyperlipidemia Brother    Diabetes Maternal Grandmother    Hypertension Paternal Grandmother    Colon cancer Neg Hx    Esophageal cancer Neg Hx    Stomach cancer Neg Hx    Rectal cancer Neg Hx     Social History   Socioeconomic History   Marital status: Widowed    Spouse name: Not on file   Number of children: 0   Years of education: college   Highest education level: Not on file  Occupational History   Occupation: retired  Tobacco Use   Smoking status: Never   Smokeless tobacco: Never   Tobacco comments:    Never used tobacco  Vaping Use   Vaping status: Never Used  Substance and Sexual Activity   Alcohol  use: Yes    Comment: ocassional glass of wine   Drug use: No   Sexual activity: Not Currently    Partners: Male    Birth control/protection: Post-menopausal    Comment: lives alone, no dietary restrictions except avoid fresh veg, fruit, whole grains  Other Topics Concern   Not on file  Social History Narrative   Patient was married ([name]) - widow   Patient does  not have any children.   Patient is right-handed.   Patient has a BA degree.   One caffeine drink daily    Social Drivers of Corporate investment banker Strain: Low Risk  (09/07/2022)   Overall Financial Resource Strain (CARDIA)    Difficulty of Paying Living Expenses: Not very hard  Food Insecurity: No Food Insecurity (06/30/2022)   Hunger Vital Sign    Worried About Running Out of Food in the Last Year: Never true    Ran Out of Food in the Last Year: Never true  Transportation Needs: No Transportation Needs (06/30/2022)   PRAPARE - Administrator, Civil Service (Medical): No    Lack of Transportation (Non-Medical): No  Recent Concern: Transportation Needs - Unmet Transportation Needs (06/30/2022)   PRAPARE - Administrator, Civil Service (Medical): Yes    Lack of Transportation (Non-Medical): No  Physical Activity: Inactive (09/07/2022)   Exercise Vital Sign    Days of Exercise per Week: 0 days    Minutes of Exercise per Session: 0 min  Stress: No Stress Concern Present (09/07/2022)   [employer] of Occupational Health - Occupational Stress Questionnaire    Feeling of Stress : Only a little  Recent Concern: Stress - Stress Concern Present (07/10/2022)   [employer] of Occupational Health - Occupational Stress Questionnaire    Feeling of Stress : To some extent  Social Connections: Moderately Isolated (09/07/2022)   Social Connection and Isolation Panel    Frequency of Communication with Friends and Family: More than three times a week    Frequency of Social Gatherings with  Friends and Family: Once a week    Attends Religious Services: More than 4 times per year    Active Member of Clubs or Organizations: No    Attends Banker Meetings: Never    Marital Status: Widowed  Intimate Partner Violence: Not At Risk (06/30/2022)   Humiliation, Afraid, Rape, and Kick questionnaire    Fear of Current or Ex-Partner: No    Emotionally Abused: No     Physically Abused: No    Sexually Abused: No    Review of Systems:  All other review of systems negative except as mentioned in the HPI.  Physical Exam: Vital signs BP (!) 147/76   Pulse 67   Temp 97.7 F (36.5 C) (Temporal)   Resp 13   Ht 5' 1 (1.549 m)   Wt 165 lb (74.8 kg)   LMP 02/07/1992   SpO2 100%   BMI 31.18 kg/m   General:   Alert,  Well-developed, well-nourished, pleasant and cooperative in NAD Lungs:  Clear throughout to auscultation.   Heart:  Regular rate and rhythm; no murmurs, clicks, rubs,  or gallops. Abdomen:  Soft, nontender and nondistended. Normal bowel sounds.   Neuro/Psych:  Alert and cooperative. Normal mood and affect. A and O x 3   @[name]  Valerie Commander, MD, [facility] [phone] (pager) 08/28/2023 11:41 AM@

## 2023-08-28 NOTE — Op Note (Signed)
 [facility] Patient Name: [name] Procedure Date: 08/28/2023 11:31 AM MRN: [id number] Endoscopist: Lupita FORBES Commander , MD, [id number] Age: 83 Referring MD:  Date of Birth: September 11, 1940 Gender: Female Account #: [id number] Procedure:                Upper GI endoscopy Indications:              Epigastric abdominal pain, Follow-up of gastric                            polyps, Early satiety. She has a long hx fundic                            gland polyps and at one time had a very small                            carcinoid tumor removed from stomach in 2012 and no                            recurrence ever Medicines:                Monitored Anesthesia Care Procedure:                Pre-Anesthesia Assessment:                           - Prior to the procedure, a History and Physical                            was performed, and patient medications and                            allergies were reviewed. The patient's tolerance of                            previous anesthesia was also reviewed. The risks                            and benefits of the procedure and the sedation                            options and risks were discussed with the patient.                            All questions were answered, and informed consent                            was obtained. Prior Anticoagulants: The patient has                            taken no anticoagulant or antiplatelet agents. ASA                            Grade Assessment: III - A patient with severe  systemic disease. After reviewing the risks and                            benefits, the patient was deemed in satisfactory                            condition to undergo the procedure.                           After obtaining informed consent, the endoscope was                            passed under direct vision. Throughout the                            procedure, the patient's blood  pressure, pulse, and                            oxygen saturations were monitored continuously. The                            Olympus scope [id number] was introduced through the                            mouth, and advanced to the second part of duodenum.                            The upper GI endoscopy was accomplished without                            difficulty. The patient tolerated the procedure                            well. Scope In: Scope Out: Findings:                 Diffuse moderate inflammation characterized by                            erythema and white discoloration was found in the                            gastric antrum. Biopsies were taken with a cold                            forceps for histology - [place] protocol.                            Verification of patient identification for the                            specimen was done. Estimated blood loss was minimal.                           Multiple diminutive sessile polyps with  no bleeding                            and no stigmata of recent bleeding were found in                            the gastric fundus and in the gastric body.                            Biopsies were taken with a cold forceps for                            histology. Verification of patient identification                            for the specimen was done. Estimated blood loss was                            minimal.                           The exam was otherwise without abnormality.                           The cardia and gastric fundus were otherwise normal                            on retroflexion. Complications:            No immediate complications. Estimated Blood Loss:     Estimated blood loss was minimal. Impression:               - Gastritis in Antrum. Biopsied.                           - Multiple gastric polyps. Biopsied. These look the                            same as last exam 2018.                           - The  examination was otherwise normal. Recommendation:           - Patient has a contact number available for                            emergencies. The signs and symptoms of potential                            delayed complications were discussed with the                            patient. Return to normal activities tomorrow.                            Written discharge instructions were provided to the  patient.                           - Resume previous diet.                           - Continue present medications.                           - Await pathology results. Lupita FORBES Commander, MD 08/28/2023 12:05:48 PM This report has been signed electronically.

## 2023-08-28 NOTE — Progress Notes (Signed)
 Pt's states no medical or surgical changes since previsit or office visit.

## 2023-08-29 ENCOUNTER — Telehealth: Payer: Self-pay

## 2023-08-29 NOTE — Telephone Encounter (Signed)
  Follow up Call-     08/28/2023   10:54 AM  Call back number  Post procedure Call Back phone  # [phone]  Permission to leave phone message Yes     Patient questions:  Do you have a fever, pain , or abdominal swelling? Yes; Patient describes mild vomiting after eating a small amount of food. Patient describes throat pain. RN advised patient to use something to help soothe the throat--Chloraseptic spray or lozenges, warm tea with honey, or gargling with warm salt water. RN instructed patient to contact Dr. Darilyn office if she does not see improvement by Friday morning. RN also advised if patient becomes severely sick, to go to the ED. Patient stated understanding.  Pain Score  5 *  Have you tolerated food without any problems? No.  Have you been able to return to your normal activities? Yes.    Do you have any questions about your discharge instructions: Diet   No. Medications  No. Follow up visit  No.  Do you have questions or concerns about your Care? No.  Actions: * If pain score is 4 or above: No action needed, pain <4.

## 2023-08-30 LAB — SURGICAL PATHOLOGY

## 2023-08-31 ENCOUNTER — Telehealth: Payer: Self-pay | Admitting: Internal Medicine

## 2023-08-31 NOTE — Telephone Encounter (Signed)
 Spoke with patient. Advised her that provider has not reviewed her results. Once they have, we will call her back with the final results and recommendations. Verbalized understanding.

## 2023-08-31 NOTE — Telephone Encounter (Signed)
 PT is calling to get the results of her EGD on 7/22. Please advise.

## 2023-09-04 ENCOUNTER — Ambulatory Visit: Payer: Self-pay | Admitting: Internal Medicine

## 2023-09-04 ENCOUNTER — Other Ambulatory Visit: Payer: Self-pay

## 2023-09-04 DIAGNOSIS — R1013 Epigastric pain: Secondary | ICD-10-CM

## 2023-09-04 NOTE — Telephone Encounter (Signed)
 Patient is calling to follow up on her results from the procedure she had. Patient is requesting to go over those with the nurse. Please advise.

## 2023-09-04 NOTE — Telephone Encounter (Signed)
 Returned call to patient. Discussed pathology results. Patient reports she is still having the feeling of pain and fullness in her abdomen with eating. She reports she is only able to eat small amounts with each meal. She reports she is taking the Pantoprazole  every day, she takes Famotidine  occasionally at hs. She reports that the Reglan  that Dr. Avram prescribes helps some but not completely. She is wondering if there is anything else that she can do.

## 2023-09-04 NOTE — Telephone Encounter (Signed)
 Called patient to discuss gastric emptying study. Patient had multiple questions. All questions answered. Advised patient schedulers will reach out to her to get the appointment scheduled.

## 2023-09-06 ENCOUNTER — Ambulatory Visit: Admitting: Dietician

## 2023-09-11 ENCOUNTER — Ambulatory Visit: Payer: Self-pay

## 2023-09-11 ENCOUNTER — Ambulatory Visit

## 2023-09-11 NOTE — Telephone Encounter (Signed)
 FYI Only or Action Required?: FYI only for provider.  Patient was last seen in primary care on 07/24/2023 by Domenica Harlene LABOR, MD.  Called Nurse Triage reporting Shoulder Pain.  Symptoms began 3 days ago.  Interventions attempted: Rest, hydration, or home remedies.  Symptoms are: unchanged.  Triage Disposition: See Physician Within 24 Hours  Patient/caregiver understands and will follow disposition?: Yes  Copied from CRM #[id number]. Topic: Clinical - Red Word Triage >> Sep 11, 2023 10:44 AM Jasmin G wrote: [facility] that prompted transfer to Nurse Triage: Pt has been experiencing severe pain on right shoulder and ribs, she does have breast cancer on right side so she thinks it could be related to that, pain has been severe for about 3 days and she also been feeling very lightheaded Reason for Disposition  [1] Unable to use arm at all AND [2] because of shoulder pain or stiffness  Answer Assessment - Initial Assessment Questions 1. ONSET: When did the pain start?     Started three days ago 2. LOCATION: Where is the pain located?     Right shoulder pain into her ribs-breast cancer on the right side 3. PAIN: How bad is the pain? (Scale 1-10; or mild, moderate, severe)     Moderate to severe 4. WORK OR EXERCISE: Has there been any recent work or exercise that involved this part of the body?     no 5. CAUSE: What do you think is causing the shoulder pain?     Unsure but does have breast cancer in the right breast 6. OTHER SYMPTOMS: Do you have any other symptoms? (e.g., neck pain, swelling, rash, fever, numbness, weakness)     Vomited last night  Protocols used: Shoulder Pain-A-AH

## 2023-09-12 ENCOUNTER — Ambulatory Visit: Admitting: Medical

## 2023-09-12 ENCOUNTER — Ambulatory Visit: Payer: Self-pay | Admitting: Medical

## 2023-09-12 ENCOUNTER — Encounter: Payer: Self-pay | Admitting: Medical

## 2023-09-12 ENCOUNTER — Ambulatory Visit (HOSPITAL_BASED_OUTPATIENT_CLINIC_OR_DEPARTMENT_OTHER)
Admission: RE | Admit: 2023-09-12 | Discharge: 2023-09-12 | Disposition: A | Discharge status: Home / Self Care | Source: Ambulatory Visit | Attending: Medical | Admitting: Medical

## 2023-09-12 ENCOUNTER — Other Ambulatory Visit (HOSPITAL_BASED_OUTPATIENT_CLINIC_OR_DEPARTMENT_OTHER): Payer: Self-pay

## 2023-09-12 ENCOUNTER — Other Ambulatory Visit: Payer: Self-pay

## 2023-09-12 VITALS — BP 108/60 | HR 60 | Temp 97.8°F | Resp 15 | Ht 61.0 in | Wt 166.0 lb

## 2023-09-12 DIAGNOSIS — M898X1 Other specified disorders of bone, shoulder: Secondary | ICD-10-CM

## 2023-09-12 DIAGNOSIS — M542 Cervicalgia: Secondary | ICD-10-CM | POA: Diagnosis not present

## 2023-09-12 DIAGNOSIS — M47812 Spondylosis without myelopathy or radiculopathy, cervical region: Secondary | ICD-10-CM | POA: Diagnosis not present

## 2023-09-12 DIAGNOSIS — M792 Neuralgia and neuritis, unspecified: Secondary | ICD-10-CM | POA: Diagnosis not present

## 2023-09-12 DIAGNOSIS — M25511 Pain in right shoulder: Secondary | ICD-10-CM | POA: Diagnosis not present

## 2023-09-12 DIAGNOSIS — R0781 Pleurodynia: Secondary | ICD-10-CM

## 2023-09-12 DIAGNOSIS — S46811D Strain of other muscles, fascia and tendons at shoulder and upper arm level, right arm, subsequent encounter: Secondary | ICD-10-CM

## 2023-09-12 MED ORDER — METHYLPREDNISOLONE 4 MG PO TABS: ORAL_TABLET | ORAL | 0 refills | Status: DC

## 2023-09-12 MED ORDER — METHYLPREDNISOLONE 4 MG PO TBPK
ORAL_TABLET | ORAL | 0 refills | Status: DC
  Filled 2023-09-12 (×2): qty 21, 6d supply, fill #0

## 2023-09-12 MED ORDER — METHYLPREDNISOLONE 4 MG PO TBPK: ORAL_TABLET | ORAL | 0 refills | Status: DC

## 2023-09-12 NOTE — Patient Instructions (Signed)
 Cervical radiculopathy with neck, right shoulder, and right arm pain(also clavicle pain. Acute pain likely due to cervical radiculopathy from foraminal stenosis or other associated changes. 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 - Prescribe Medrol  4 mg, 6-day taper to reduce inflammation. - Order cervical spine x-ray, shoulder x-ray, clavicle x-ray, and right rib series x-ray. - Advise daily blood sugar monitoring and low sugar diet. - Instruct to notify if blood sugar exceeds 200 mg/dL.  Rt rib pain - no injury. Get rt rib xrays.  Type 2 diabetes mellitus Diabetes well-controlled with A1c of 6.3. Emphasized monitoring blood sugar during Medrol  taper due to potential increase from steroids. - Monitor blood sugar levels daily during Medrol  treatment. - Maintain a low sugar diet. -continue current diabetic meds. -let me know if sugars exceed 200  Follow up 09-21-2023 or sooner if needed

## 2023-09-12 NOTE — Progress Notes (Signed)
 Subjective:    [name], female    DOB: October 29, 1940, 83 y.o.   MRN: [id number]  HPI [name] is an 83 year old female with cervical spine stenosis who presents with neck and shoulder pain.  She has been experiencing severe pain in the back of her neck, rt  trapezius, and shoulder for the past four days, with a pain intensity of 8 to 10 out of 10. The pain worsens by the end of the day, and in the morning, she sometimes cannot move due to the pain. The pain radiates down her right arm, which is a new symptom.  She has attempted to alleviate the pain using Aspercreme and hot showers, which provide some relief when she applies pressure to the affected area. She has not taken any oral medications for the pain.  Her medical history includes cervical spine issues, with a cervical spine x-ray from 2022 that showed severe foraminal stenosis at C3 to C4, moderate foraminal stenosis at C4 to C5, and mild to moderate bilateral stenosis at C5 to C6.  She is diabetic, with a recent A1c of 6.3, corresponding to an average blood sugar of 134. She monitors her blood sugar regularly, with a reading of 110 this morning. No similar pain in the past and this is the first time she has experienced pain radiating down her arm.   Review of Systems  Constitutional:  Negative for chills and fatigue.  Respiratory:  Negative for cough, chest tightness, shortness of breath and wheezing.   Cardiovascular:  Negative for chest pain and palpitations.  Gastrointestinal:  Negative for abdominal pain.  Musculoskeletal:  Positive for neck pain.       See hpi.  Skin:  Negative for rash.  Neurological:  Negative for dizziness, weakness and light-headedness.  Hematological:  Negative for adenopathy. Does not bruise/bleed easily.  Psychiatric/Behavioral:  Negative for behavioral problems and decreased concentration.    Past Medical History:  Diagnosis Date   Abdominal pain in female  03/18/2010   Qualifier: Diagnosis of  By: Avram MD, NOLIA Pitts E    Anemia 06/08/2014   Anxiety    Arthritis    Spinal Osteoarthritis   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 09/05/2006   Qualifier: Diagnosis of  By: Wilhemina RMA, Lucy     Diabetic peripheral neuropathy (HCC) 10/29/2013   Encounter for preventative adult health care exam with abnormal findings 09/14/2013   Esophageal reflux    Gastric polyp    Fundic Gland   Gastroparesis    Headache(784.0)    Heart murmur    Echocardiogram 2/11: EF 60-65%, mild LAE, grade 1 diastolic dysfunction, aortic valve sclerosis, mean gradient 9 mm of mercury, PASP 34   Hematuria 03/16/2016   Iron  deficiency anemia, unspecified    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   Leg swelling    bilateral   Neck pain 04/22/2015   PONV (postoperative nausea and vomiting)    pt states body temperature drops every time she has anesthesia; pt states only needs small amount of anesthesia   PSVT (paroxysmal supraventricular tachycardia) (HCC)    Pure hypercholesterolemia    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   Renal insufficiency 06/18/2019   pt states  L  kidney function very low-functions at about 20%   Stroke [facility])    tia, 2014   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   Type II or unspecified type diabetes mellitus without mention of complication, not stated as uncontrolled    Unspecified essential hypertension    Unspecified hereditary and idiopathic peripheral neuropathy 10/29/2013   Vitamin D  deficiency      Social History   Socioeconomic History   Marital status: Widowed    Spouse name: Not on file   Number of children: 0   Years of education: college   Highest education level: Not on file  Occupational History   Occupation: retired  Tobacco Use   Smoking status: Never    Smokeless tobacco: Never   Tobacco comments:    Never used tobacco  Vaping Use   Vaping status: Never Used  Substance and Sexual Activity   Alcohol  use: Yes    Comment: ocassional glass of wine   Drug use: No   Sexual activity: Not Currently    Partners: Male    Birth control/protection: Post-menopausal    Comment: lives alone, no dietary restrictions except avoid fresh veg, fruit, whole grains  Other Topics Concern   Not on file  Social History Narrative   Patient was married ([name]) - widow   Patient does not have any children.   Patient is right-handed.   Patient has a BA degree.   One caffeine drink daily    Social Drivers of Corporate investment banker Strain: Low Risk  (09/07/2022)   Overall Financial Resource Strain (CARDIA)    Difficulty of Paying Living Expenses: Not very hard  Food Insecurity: No Food Insecurity (06/30/2022)   Hunger Vital Sign    Worried About Running Out of Food in the Last Year: Never true    Ran Out of Food in the Last Year: Never true  Transportation Needs: No Transportation Needs (06/30/2022)   PRAPARE - Administrator, Civil Service (Medical): No    Lack of Transportation (Non-Medical): No  Recent Concern: Transportation Needs - Unmet Transportation Needs (06/30/2022)   PRAPARE - Administrator, Civil Service (Medical): Yes    Lack of Transportation (Non-Medical): No  Physical Activity: Inactive (09/07/2022)   Exercise Vital Sign    Days of Exercise per Week: 0 days    Minutes of Exercise per Session: 0 min  Stress: No Stress Concern Present (09/07/2022)   [employer] of Occupational Health - Occupational Stress Questionnaire    Feeling of Stress : Only a little  Recent Concern: Stress - Stress Concern Present (07/10/2022)   [employer] of Occupational Health - Occupational Stress Questionnaire    Feeling of Stress : To some extent  Social Connections: Moderately Isolated (09/07/2022)   Social Connection and  Isolation Panel    Frequency of Communication with Friends and Family: More than three times a week    Frequency of Social Gatherings with Friends and Family: Once a week    Attends Religious Services: More than 4 times per year    Active Member of [employer] or Organizations: No    Attends Banker Meetings: Never    Marital Status: Widowed  Intimate Partner Violence: Not At Risk (06/30/2022)   Humiliation, Afraid, Rape, and Kick questionnaire    Fear of Current or Ex-Partner: No    Emotionally Abused: No    Physically Abused: No    Sexually Abused: No    Past Surgical History:  Procedure Laterality  Date   BREAST BIOPSY Right 06/13/2022   US  RT BREAST BX W LOC DEV 1ST LESION IMG BX SPEC US  GUIDE 06/13/2022 [facility] MAMMOGRAPHY   BREAST LUMPECTOMY Right 07/31/2022   Procedure: RIGHT BREAST LUMPECTOMY;  Surgeon: Vernetta Berg, MD;  Location: [facility];  Service: General;  Laterality: Right;   CHOLECYSTECTOMY  1993   COLONOSCOPY  11/11/2010   diverticulosis   DILATATION & CURRETTAGE/HYSTEROSCOPY WITH RESECTOCOPE N/A 02/25/2013   Procedure: Attempted hysteroscopy with uterine perforation;  Surgeon: Bobie FORBES Crown de Charlynn FORBES Cary, MD;  Location: [facility];  Service: Gynecology;  Laterality: N/A;   ESOPHAGOGASTRODUODENOSCOPY  08/29/2010; 09/15/2010   Carcinoid tumor less than 1 cm in July 2012 not seen in August 2012 , gastritis, fundic gland polyps   ESOPHAGOGASTRODUODENOSCOPY  05/16/2011   ESOPHAGOGASTRODUODENOSCOPY  06/14/2012   EUS  12/15/2010   Procedure: UPPER ENDOSCOPIC ULTRASOUND (EUS) LINEAR;  Surgeon: Toribio Cedar, MD;  Location: [facility];  Service: Endoscopy;  Laterality: N/A;   EYE SURGERY Bilateral    Bi lateral cateracts and bi lateral laser   LAPAROSCOPY N/A 02/25/2013   Procedure: Cystoscopy and laparoscopy with fulguration of uterine serosa;  Surgeon: Bobie FORBES Crown de Charlynn FORBES Cary, MD;  Location: [facility];  Service: Gynecology;  Laterality: N/A;    TONSILLECTOMY      Family History  Problem Relation Age of Onset   Diabetes Mother    Stroke Father        deceased age 57   Heart disease Sister        deceased MI age 22   Diabetes Sister    Heart disease Sister    Hypertension Sister    Hyperlipidemia Sister    Diabetes Sister    Heart disease Sister    Hypertension Sister    Hyperlipidemia Sister    Heart disease Brother        deceased MI age 27   Intellectual disability Brother    Diabetes Brother    Heart disease Brother    Hypertension Brother    Hyperlipidemia Brother    Diabetes Maternal Grandmother    Hypertension Paternal Grandmother    Colon cancer Neg Hx    Esophageal cancer Neg Hx    Stomach cancer Neg Hx    Rectal cancer Neg Hx     Allergies  Allergen Reactions   Tramadol  Other (See Comments)    Dizziness     Current Outpatient Medications on File Prior to Visit  Medication Sig Dispense Refill   acetaminophen  (TYLENOL ) 325 MG tablet Take 2 tablets (650 mg total) by mouth every 6 (six) hours as needed for up to 30 doses for mild pain or moderate pain. 30 tablet 0   amLODipine  (NORVASC ) 5 MG tablet TAKE 1 TABLET BY MOUTH DAILY 100 tablet 2   aspirin  EC 81 MG tablet Take 81 mg by mouth daily. Swallow whole.     Blood Glucose Monitoring Suppl (ONE TOUCH ULTRA 2) w/Device KIT USE TO CHECK BLOOD SUGAR AS DIRECTED 1 kit 0   calcium -vitamin D  (OSCAL WITH D) 500-5 MG-MCG tablet Take 1 tablet by mouth daily.     cholecalciferol  (VITAMIN D ) 1000 UNITS tablet Take 1,000 Units by mouth daily.     dicyclomine  (BENTYL ) 10 MG capsule Take 1 capsule (10 mg total) by mouth as needed for spasms. 60 capsule 0   famotidine  (PEPCID ) 40 MG tablet Take 1 tablet (40 mg total) by mouth daily. 30 tablet 4   furosemide  (  LASIX ) 20 MG tablet TAKE 1 TABLET BY MOUTH DAILY AS  NEEDED 100 tablet 2   glucose blood (ONETOUCH ULTRA) test strip CHECK BLOOD SUGAR 3 TIMES  DAILY OR AS NEEDED. 300 strip 12   Lancets (ONETOUCH ULTRASOFT)  lancets USE AS DIRECTED 3 TIMES  DAILY 300 each 3   losartan  (COZAAR ) 50 MG tablet TAKE 1 TABLET BY MOUTH TWICE  DAILY 200 tablet 2   metFORMIN  (GLUCOPHAGE -XR) 500 MG 24 hr tablet TAKE 1 TABLET BY MOUTH IN THE  MORNING AND 1 TABLET BY MOUTH AT BEDTIME 200 tablet 2   metoCLOPramide  (REGLAN ) 5 MG tablet TAKE 1 TABLET BY MOUTH TWICE  DAILY AS NEEDED 180 tablet 2   metoprolol  tartrate (LOPRESSOR ) 50 MG tablet TAKE 1 TABLET BY MOUTH TWICE  DAILY 200 tablet 3   pantoprazole  (PROTONIX ) 40 MG tablet Take 1 tablet (40 mg total) by mouth daily. 90 tablet 3   potassium chloride  (KLOR-CON  M) 10 MEQ tablet TAKE 1 TABLET BY MOUTH DAILY 100 tablet 2   rosuvastatin  (CRESTOR ) 20 MG tablet TAKE 1 TABLET BY MOUTH DAILY 100 tablet 1   thiamine  (VITAMIN B-1) 100 MG tablet Take 100 mg by mouth every other day.     vitamin E 180 MG (400 UNITS) capsule Take 400 Units by mouth daily.     No current facility-administered medications on file prior to visit.    BP 108/60   Pulse 60   Temp 97.8 F (36.6 C) (Oral)   Resp 15   Ht 5' 1 (1.549 m)   Wt 166 lb (75.3 kg)   LMP 02/07/1992   SpO2 99%   BMI 31.37 kg/m       Objective:   Physical Exam  General- No acute distress. Pleasant patient. Neck- Full range of motion, no jvd. Rt side paracervical pain to palpatoin. Rt trapezius tender to palpation Rt shoulder- pain on palpation and rom. Lungs- Clear, even and unlabored. Heart- regular rate and rhythm. Neurologic- CNII- XII grossly intact.  Back- rt lower posterior rib tender to palpation in area under scapla. Skin- no rash on back. Mild tender to palpation.     Assessment & Plan:   Patient Instructions  Cervical radiculopathy with neck, right shoulder, and right arm pain(also clavicle pain. Acute pain likely due to cervical radiculopathy from foraminal stenosis or other associated changes. Severe foraminal stenosis at C3-C4, moderate at C4-C5, mild to moderate bilateral stenosis at C5-C6 causing nerve  irritation. - Prescribe Medrol  4 mg, 6-day taper to reduce inflammation. - Order cervical spine x-ray, shoulder x-ray, clavicle x-ray, and right rib series x-ray. - Advise daily blood sugar monitoring and low sugar diet. - Instruct to notify if blood sugar exceeds 200 mg/dL.  Rt rib pain - no injury. Get rt rib xrays.  Type 2 diabetes mellitus Diabetes well-controlled with A1c of 6.3. Emphasized monitoring blood sugar during Medrol  taper due to potential increase from steroids. - Monitor blood sugar levels daily during Medrol  treatment. - Maintain a low sugar diet. -continue current diabetic meds. -let me know if sugars exceed 200  Follow up 09-21-2023 or sooner if needed   [employer], PA-C

## 2023-09-14 ENCOUNTER — Other Ambulatory Visit (HOSPITAL_BASED_OUTPATIENT_CLINIC_OR_DEPARTMENT_OTHER): Payer: Self-pay

## 2023-09-14 ENCOUNTER — Telehealth: Payer: Self-pay

## 2023-09-14 NOTE — Telephone Encounter (Signed)
Patient was advised and verbalized understanding. 

## 2023-09-14 NOTE — Telephone Encounter (Signed)
 Copied from CRM [id number]. Topic: Clinical - Lab/Test Results >> Sep 14, 2023 10:42 AM Viola F wrote: Reason for CRM: Patient called regarding x-ray results from 09/12/23

## 2023-09-14 NOTE — Telephone Encounter (Signed)
 Patient reports she has taking 3 doses of steroids so far and her blood sugar level were elevated. Patient checked her blood sugar while on the phone with me and it was 183. She stated she doesn't want to stop the medication if it will help her.[name]

## 2023-09-14 NOTE — Telephone Encounter (Signed)
 Spoke to patient and she would like to know what does it mean about Multilevel degenerative changes on c spine, would that be the reason she has neck/ shoulder pain? Also would like to know if she should complete the steroid medication since her blood sugars are running up to 220's. Please advise.

## 2023-09-21 ENCOUNTER — Encounter: Payer: Self-pay | Admitting: Medical

## 2023-09-21 ENCOUNTER — Other Ambulatory Visit: Payer: Self-pay

## 2023-09-21 ENCOUNTER — Ambulatory Visit: Admitting: Medical

## 2023-09-21 VITALS — BP 118/60 | HR 60 | Temp 98.3°F | Resp 15 | Ht 61.0 in | Wt 165.4 lb

## 2023-09-21 DIAGNOSIS — S46811D Strain of other muscles, fascia and tendons at shoulder and upper arm level, right arm, subsequent encounter: Secondary | ICD-10-CM | POA: Diagnosis not present

## 2023-09-21 DIAGNOSIS — M792 Neuralgia and neuritis, unspecified: Secondary | ICD-10-CM

## 2023-09-21 DIAGNOSIS — M542 Cervicalgia: Secondary | ICD-10-CM | POA: Diagnosis not present

## 2023-09-21 DIAGNOSIS — E1169 Type 2 diabetes mellitus with other specified complication: Secondary | ICD-10-CM

## 2023-09-21 MED ORDER — ACCU-CHEK GUIDE TEST VI STRP: ORAL_STRIP | 4 refills | Status: DC

## 2023-09-21 MED ORDER — ACCU-CHEK GUIDE ME W/DEVICE KIT
PACK | 0 refills | Status: DC
Start: 2023-09-21 — End: 2023-10-02

## 2023-09-21 NOTE — Patient Instructions (Signed)
 Neck pain resolved now with resolved radicular pain Cervical spondylosis with multilevel degenerative changes Cervical spondylosis at C5, C6, and C7 with osteophytes and C2-C3 fusion. Previous MRI showed foraminal stenosis at C5 and C6(foraminal narrowing). Pain improved with Medrol , indicating anti-inflammatory response. She prefers to avoid surgery due to past negative experiences and age-related risks. - Do not take Medrol  currently; monitor for pain recurrence. - If pain recurs, consider modified Medrol  taper starting with 3 tablets on the first day, 2 tablets on the second day, and 1 tablet daily thereafter, pending coordination with Dr. Carleton. - Coordinate with Dr. Carleton for any future Medrol  prescriptions due to diabetes-related concerns. - Consider cervical spine MRI or neurosurgical referral if pain recurs and does not respond to Medrol .  Type 2 diabetes mellitus Type 2 diabetes with previous hyperglycemia on higher Medrol  doses. Blood sugar exceeded 250 mg/dL initially but controlled with reduced dose. Discussed pain management versus blood sugar control. - Monitor blood sugar levels closely if Medrol  is resumed in the future. - Coordinate with Dr. Carleton before resuming Medrol  to ensure diabetes management is aligned.  Follow up with pcp as regularly

## 2023-09-21 NOTE — Progress Notes (Signed)
 Subjective:    [name], female    DOB: 1940/09/17, 83 y.o.   MRN: [id number]  HPI  [name] is an 83 year old female with cervical osteoarthritis who presents with neck, shoulder, and arm pain.  She has been experiencing neck, shoulder, and arm pain, initially evaluated on September 12, 2023. An x-ray revealed osteophytes and degenerative changes at the C5, C6, and C7 levels. Additionally, there was a noted fusion of the C2 and C3 vertebrae and multilevel degenerative changes in the cervical spine. Despite these findings, the shoulder, clavicle, and rib x-rays were negative.  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 She has been managing her symptoms with Medrol , a steroid, which has provided significant relief. She initially took a higher dose but adjusted to one pill a day due to elevated blood sugar levels, a side effect she experienced due to her diabetes. After ten days of this adjusted regimen, her pain improved significantly.  She continues to perform stretching exercises daily, which she finds beneficial. She also applies heat to her shoulder to alleviate discomfort. She has some remaining Medrol  tablets but is not taking them currently.  Her diabetes affects her medication management, particularly with steroids like Medrol . Blood sugar levels increased to over 250 mg/dL with the initial higher dose of Medrol , but were controlled with a reduced dose of one tablet daily. Sugar came back down to mid 150 range.      Review of Systems  Constitutional:  Negative for chills, fatigue and fever.  Respiratory:  Negative for chest tightness, shortness of breath and wheezing.   Cardiovascular:  Negative for chest pain and palpitations.  Gastrointestinal:  Negative for abdominal pain and blood in stool.  Genitourinary:  Negative for dysuria.  Musculoskeletal:  Negative for arthralgias, myalgias and neck pain.        Resolved.  Neurological:  Negative for dizziness and light-headedness.  Hematological:  Negative for adenopathy. Does not bruise/bleed easily.     Past Medical History:  Diagnosis Date   Abdominal pain in female 03/18/2010   Qualifier: Diagnosis of  By: Avram MD, NOLIA Pitts E    Anemia 06/08/2014   Anxiety    Arthritis    Spinal Osteoarthritis   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 09/05/2006   Qualifier: Diagnosis of  By: Wilhemina RMA, Lucy     Diabetic peripheral neuropathy (HCC) 10/29/2013   Encounter for preventative adult health care exam with abnormal findings 09/14/2013   Esophageal reflux    Gastric polyp    Fundic Gland   Gastroparesis    Headache(784.0)    Heart murmur    Echocardiogram 2/11: EF 60-65%, mild LAE, grade 1 diastolic dysfunction, aortic valve sclerosis, mean gradient 9 mm of mercury, PASP 34   Hematuria 03/16/2016   Iron  deficiency anemia, unspecified    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   Leg swelling    bilateral   Neck pain 04/22/2015   PONV (postoperative nausea and vomiting)    pt states body temperature drops every time she has anesthesia; pt states only needs small amount of anesthesia   PSVT (paroxysmal supraventricular tachycardia) (HCC)  Pure hypercholesterolemia    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   Renal insufficiency 06/18/2019   pt states  L kidney function very low-functions at about 20%   Stroke [facility])    tia, 2014   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   Type II or unspecified type diabetes mellitus without mention of complication, not stated as uncontrolled    Unspecified essential hypertension    Unspecified hereditary and idiopathic peripheral neuropathy 10/29/2013   Vitamin D  deficiency      Social History   Socioeconomic History   Marital  status: Widowed    Spouse name: Not on file   Number of children: 0   Years of education: college   Highest education level: Not on file  Occupational History   Occupation: retired  Tobacco Use   Smoking status: Never   Smokeless tobacco: Never   Tobacco comments:    Never used tobacco  Vaping Use   Vaping status: Never Used  Substance and Sexual Activity   Alcohol  use: Yes    Comment: ocassional glass of wine   Drug use: No   Sexual activity: Not Currently    Partners: Male    Birth control/protection: Post-menopausal    Comment: lives alone, no dietary restrictions except avoid fresh veg, fruit, whole grains  Other Topics Concern   Not on file  Social History Narrative   Patient was married ([name]) - widow   Patient does not have any children.   Patient is right-handed.   Patient has a BA degree.   One caffeine drink daily    Social Drivers of Corporate investment banker Strain: Low Risk  (09/07/2022)   Overall Financial Resource Strain (CARDIA)    Difficulty of Paying Living Expenses: Not very hard  Food Insecurity: No Food Insecurity (06/30/2022)   Hunger Vital Sign    Worried About Running Out of Food in the Last Year: Never true    Ran Out of Food in the Last Year: Never true  Transportation Needs: No Transportation Needs (06/30/2022)   PRAPARE - Administrator, Civil Service (Medical): No    Lack of Transportation (Non-Medical): No  Recent Concern: Transportation Needs - Unmet Transportation Needs (06/30/2022)   PRAPARE - Administrator, Civil Service (Medical): Yes    Lack of Transportation (Non-Medical): No  Physical Activity: Inactive (09/07/2022)   Exercise Vital Sign    Days of Exercise per Week: 0 days    Minutes of Exercise per Session: 0 min  Stress: No Stress Concern Present (09/07/2022)   [employer] of Occupational Health - Occupational Stress Questionnaire    Feeling of Stress : Only a little  Recent Concern: Stress - Stress  Concern Present (07/10/2022)   [employer] of Occupational Health - Occupational Stress Questionnaire    Feeling of Stress : To some extent  Social Connections: Moderately Isolated (09/07/2022)   Social Connection and Isolation Panel    Frequency of Communication with Friends and Family: More than three times a week    Frequency of Social Gatherings with Friends and Family: Once a week    Attends Religious Services: More than 4 times per year    Active Member of [employer] or Organizations: No    Attends Banker Meetings: Never    Marital Status: Widowed  Intimate Partner Violence: Not At Risk (06/30/2022)   Humiliation, Afraid, Rape, and Kick questionnaire    Fear of Current or Ex-Partner: No    Emotionally Abused: No  Physically Abused: No    Sexually Abused: No    Past Surgical History:  Procedure Laterality Date   BREAST BIOPSY Right 06/13/2022   US  RT BREAST BX W LOC DEV 1ST LESION IMG BX SPEC US  GUIDE 06/13/2022 [facility]MOGRAP[facility]   BREAST LUMPECTOMY Right 07/31/2022   Procedure: RIGHT BREAST LUMPECTOMY;  Surgeon: Vernetta Berg, MD;  Location:  S[facility]ERY [facility]ENTER;  Service: General;  Laterality: Right;   CHOLECYSTECTOMY  1993   COLONOSCOPY  11/11/2010   diverticulosis   DILATATION & CURRETTAGE/HYSTEROSCOPY WITH RESECTOCOPE N/A 02/25/2013   Procedure: Attempted hysteroscopy with uterine perforation;  Surgeon: Bobie FORBES Crown de Charlynn FORBES Cary, MD;  Location: [facility];  Service: Gynecology;  Laterality: N/A;   ESOPHAGOGASTRODUODENOSCOPY  08/29/2010; 09/15/2010   Carcinoid tumor less than 1 cm in July 2012 not seen in August 2012 , gastritis, fundic gland polyps   ESOPHAGOGASTRODUODENOSCOPY  05/16/2011   ESOPHAGOGASTRODUODENOSCOPY  06/14/2012   EUS  12/15/2010   Procedure: UPPER ENDOSCOPIC ULTRASOUND (EUS) LINEAR;  Surgeon: Toribio Cedar, MD;  Location: [facility];  Service: Endoscopy;  Laterality: N/A;   EYE SURGERY Bilateral    Bi lateral cateracts and bi lateral  laser   LAPAROSCOPY N/A 02/25/2013   Procedure: Cystoscopy and laparoscopy with fulguration of uterine serosa;  Surgeon: Bobie FORBES Crown de Charlynn FORBES Cary, MD;  Location: [facility];  Service: Gynecology;  Laterality: N/A;   TONSILLECTOMY      Family History  Problem Relation Age of Onset   Diabetes Mother    Stroke Father        deceased age 10   Heart disease Sister        deceased MI age 35   Diabetes Sister    Heart disease Sister    Hypertension Sister    Hyperlipidemia Sister    Diabetes Sister    Heart disease Sister    Hypertension Sister    Hyperlipidemia Sister    Heart disease Brother        deceased MI age 63   Intellectual disability Brother    Diabetes Brother    Heart disease Brother    Hypertension Brother    Hyperlipidemia Brother    Diabetes Maternal Grandmother    Hypertension Paternal Grandmother    Colon cancer Neg Hx    Esophageal cancer Neg Hx    Stomach cancer Neg Hx    Rectal cancer Neg Hx     Allergies  Allergen Reactions   Tramadol  Other (See Comments)    Dizziness     Current Outpatient Medications on File Prior to Visit  Medication Sig Dispense Refill   acetaminophen  (TYLENOL ) 325 MG tablet Take 2 tablets (650 mg total) by mouth every 6 (six) hours as needed for up to 30 doses for mild pain or moderate pain. 30 tablet 0   amLODipine  (NORVASC ) 5 MG tablet TAKE 1 TABLET BY MOUTH DAILY 100 tablet 2   aspirin  EC 81 MG tablet Take 81 mg by mouth daily. Swallow whole.     Blood Glucose Monitoring Suppl (ONE TOUCH ULTRA 2) w/Device KIT USE TO CHECK BLOOD SUGAR AS DIRECTED 1 kit 0   calcium -vitamin D  (OSCAL WITH D) 500-5 MG-MCG tablet Take 1 tablet by mouth daily.     cholecalciferol  (VITAMIN D ) 1000 UNITS tablet Take 1,000 Units by mouth daily.     dicyclomine  (BENTYL ) 10 MG capsule Take 1 capsule (10 mg total) by mouth as needed for spasms. 60 capsule 0   famotidine  (PEPCID )  40 MG tablet Take 1 tablet (40 mg total) by mouth daily. 30 tablet 4    furosemide  (LASIX ) 20 MG tablet TAKE 1 TABLET BY MOUTH DAILY AS  NEEDED 100 tablet 2   glucose blood (ONETOUCH ULTRA) test strip CHECK BLOOD SUGAR 3 TIMES  DAILY OR AS NEEDED. 300 strip 12   Lancets (ONETOUCH ULTRASOFT) lancets USE AS DIRECTED 3 TIMES  DAILY 300 each 3   losartan  (COZAAR ) 50 MG tablet TAKE 1 TABLET BY MOUTH TWICE  DAILY 200 tablet 2   metFORMIN  (GLUCOPHAGE -XR) 500 MG 24 hr tablet TAKE 1 TABLET BY MOUTH IN THE  MORNING AND 1 TABLET BY MOUTH AT BEDTIME 200 tablet 2   methylPREDNISolone  (MEDROL  DOSEPAK) 4 MG TBPK tablet Use as directed; Follow directions on package. Standard 6 day taper dose pack. 21 tablet 0   metoCLOPramide  (REGLAN ) 5 MG tablet TAKE 1 TABLET BY MOUTH TWICE  DAILY AS NEEDED 180 tablet 2   metoprolol  tartrate (LOPRESSOR ) 50 MG tablet TAKE 1 TABLET BY MOUTH TWICE  DAILY 200 tablet 3   pantoprazole  (PROTONIX ) 40 MG tablet Take 1 tablet (40 mg total) by mouth daily. 90 tablet 3   potassium chloride  (KLOR-CON  M) 10 MEQ tablet TAKE 1 TABLET BY MOUTH DAILY 100 tablet 2   rosuvastatin  (CRESTOR ) 20 MG tablet TAKE 1 TABLET BY MOUTH DAILY 100 tablet 1   thiamine  (VITAMIN B-1) 100 MG tablet Take 100 mg by mouth every other day.     vitamin E 180 MG (400 UNITS) capsule Take 400 Units by mouth daily.     No current facility-administered medications on file prior to visit.    BP 118/60   Pulse 60   Temp 98.3 F (36.8 C) (Oral)   Resp 15   Ht 5' 1 (1.549 m)   Wt 165 lb 6.4 oz (75 kg)   LMP 02/07/1992   SpO2 98%   BMI 31.25 kg/m        Objective:   Physical Exam   General Mental Status- Alert. General Appearance- Not in acute distress.   Skin General: Color- Normal Color. Moisture- Normal Moisture.  Neck  No JVD. No mid c spine pain on palpation.  Chest and Lung Exam Auscultation: Breath Sounds:-Normal.  Cardiovascular Auscultation:Rythm- Regular. Murmurs & Other Heart Sounds:Auscultation of the heart reveals- No  Murmurs.  Abdomen Inspection:-Inspeection Normal. Palpation/Percussion:Note:No mass. Palpation and Percussion of the abdomen reveal- Non Tender, Non Distended + BS, no rebound or guarding.   Neurologic Cranial Nerve exam:- CN III-XII intact(No nystagmus), symmetric smile. Strength:- 5/5 equal and symmetric strength both upper and lower extremities.      Assessment & Plan:   Patient Instructions  Neck pain resolved now with resolved radicular pain Cervical spondylosis with multilevel degenerative changes Cervical spondylosis at C5, C6, and C7 with osteophytes and C2-C3 fusion. Previous MRI showed foraminal stenosis at C5 and C6(foraminal narrowing). Pain improved with Medrol , indicating anti-inflammatory response. She prefers to avoid surgery due to past negative experiences and age-related risks. - Do not take Medrol  currently; monitor for pain recurrence. - If pain recurs, consider modified Medrol  taper starting with 3 tablets on the first day, 2 tablets on the second day, and 1 tablet daily thereafter, pending coordination with Dr. Carleton. - Coordinate with Dr. Carleton for any future Medrol  prescriptions due to diabetes-related concerns. - Consider cervical spine MRI or neurosurgical referral if pain recurs and does not respond to Medrol .  Type 2 diabetes mellitus Type 2 diabetes with previous hyperglycemia  on higher Medrol  doses. Blood sugar exceeded 250 mg/dL initially but controlled with reduced dose. Discussed pain management versus blood sugar control. - Monitor blood sugar levels closely if Medrol  is resumed in the future. - Coordinate with Dr. Carleton before resuming Medrol  to ensure diabetes management is aligned.  Follow up with pcp as regularly    Jace Fermin, PA-C

## 2023-10-01 ENCOUNTER — Telehealth: Payer: Self-pay

## 2023-10-01 DIAGNOSIS — E1169 Type 2 diabetes mellitus with other specified complication: Secondary | ICD-10-CM

## 2023-10-01 NOTE — Telephone Encounter (Signed)
 Copied from CRM #[id number]. Topic: General - Other >> Oct 01, 2023 12:54 PM Rosina BIRCH wrote: Reason for CRM: patient called stating she want her acucheck guide me monitor and test strips sent [facility]. Patient stated her insurance does not cover one touch so she want the acu check one instead. Patient stated the office sent it to the wrong phamacy CB [phone]

## 2023-10-02 ENCOUNTER — Ambulatory Visit: Admitting: Internal Medicine

## 2023-10-02 ENCOUNTER — Telehealth: Payer: Self-pay | Admitting: Internal Medicine

## 2023-10-02 MED ORDER — ACCU-CHEK GUIDE ME W/DEVICE KIT: PACK | 0 refills | Status: AC

## 2023-10-02 MED ORDER — ACCU-CHEK GUIDE TEST VI STRP: ORAL_STRIP | 4 refills | Status: DC

## 2023-10-02 NOTE — Telephone Encounter (Signed)
 A cancellation for tomorrow at 1050 with Dr Avram has opened. Pt has been added and made aware.

## 2023-10-02 NOTE — Telephone Encounter (Signed)
 Inbound call from patient stating she would like to know if theres a sooner availability to see Dr.Gessner in office or if she can have a virtual visit since she could not make her appointment today 8/26 due to having car troubles. Patient states she can not wait until October to see him Requesting a call back if we have sooner availability  Please advise  Thank you

## 2023-10-02 NOTE — Telephone Encounter (Signed)
 Patient states that the meter and test strips should have been send to [facility].

## 2023-10-02 NOTE — Addendum Note (Signed)
 Addended by: Jaxsin Bottomley C on: 10/02/2023 04:15 PM   Modules accepted: Orders

## 2023-10-03 ENCOUNTER — Encounter: Payer: Self-pay | Admitting: Internal Medicine

## 2023-10-03 ENCOUNTER — Ambulatory Visit: Admitting: Internal Medicine

## 2023-10-03 VITALS — BP 110/60 | HR 55 | Ht 61.0 in | Wt 166.6 lb

## 2023-10-03 DIAGNOSIS — D131 Benign neoplasm of stomach: Secondary | ICD-10-CM | POA: Diagnosis not present

## 2023-10-03 DIAGNOSIS — K581 Irritable bowel syndrome with constipation: Secondary | ICD-10-CM | POA: Diagnosis not present

## 2023-10-03 DIAGNOSIS — K3184 Gastroparesis: Secondary | ICD-10-CM | POA: Diagnosis not present

## 2023-10-03 DIAGNOSIS — K30 Functional dyspepsia: Secondary | ICD-10-CM | POA: Diagnosis not present

## 2023-10-03 DIAGNOSIS — R6881 Early satiety: Secondary | ICD-10-CM

## 2023-10-03 NOTE — Progress Notes (Signed)
 [name] 83 y.o. 07-04-1940 [id number]  Assessment & Plan:   Encounter Diagnoses  Name Primary?   Gastroparesis Yes   Functional dyspepsia    Benign fundic gland polyps of stomach    Early satiety    Irritable bowel syndrome with constipation        Manage with dietary modifications and Reglan  prn. Symptoms exacerbated by certain foods. No vomiting. Condition due to stomach muscle and nerve disorder. Buspirone discussed but not recommended due to side effects. - Continue Reglan  as needed, primarily in the morning and at dinner. - Avoid foods that exacerbate symptoms, such as citrus, meat, and salads. - Encourage dietary modifications, including avoiding fatty foods and opting for tolerated foods like fish.  Benign fundic gland polyps identified during upper endoscopy. No symptoms or complications.  Mild gastropathy observed during upper endoscopy. No significant symptoms or complications. Not a source of current symptoms.     Continue olive oil - helping IBS-C  RTC 1 year, sooner prn   Subjective:   Chief Complaint: abdominal pain and early satiety  HPI Discussed the use of AI scribe software for clinical note transcription with the patient, who gave verbal consent to proceed.  [name] is an 83 year old female with a history of benign carcinoid tumor of stomach, fundic gland polyps of stomach, gastroparesis and IBS-C who presents with persistent stomach fullness and dietary intolerance.  She experiences persistent symptoms of stomach fullness and early satiety, even after consuming small amounts of food. She feels full quickly and often avoids eating large meals. Her symptoms are exacerbated by certain foods, particularly citrus fruits, meats, and salads, which she finds difficult to digest. Fish is easier for her to consume.  She has a history of gastroparesis and recalls undergoing an upper endoscopy in the past. She manages her symptoms with  dietary modifications and medication. She takes Reglan , primarily in the morning and at dinner, which helps her eat more comfortably, though she limits its use due to potential side effects.  Her colon symptoms have improved with the use of olive oil, which she takes daily, helping her maintain regular bowel movements every other day. She avoids fatty foods and prefers not to consume meat, opting for lighter meals.  No vomiting or significant nausea. She occasionally feels 'big here' and swollen in the abdomen, indicating discomfort in the stomach area. She is cautious about her diet to prevent exacerbation of her symptoms.     EGD 7/22205 - numerous fundic gland polyps and reactive gastropathy w/o H pylori   Allergies  Allergen Reactions   Tramadol  Other (See Comments)    Dizziness    Current Meds  Medication Sig   acetaminophen  (TYLENOL ) 325 MG tablet Take 2 tablets (650 mg total) by mouth every 6 (six) hours as needed for up to 30 doses for mild pain or moderate pain.   amLODipine  (NORVASC ) 5 MG tablet TAKE 1 TABLET BY MOUTH DAILY   aspirin  EC 81 MG tablet Take 81 mg by mouth daily. Swallow whole.   Blood Glucose Monitoring Suppl (ACCU-CHEK GUIDE ME) w/Device KIT CHECK BLOOD SUGAR 3 TIMES DAILY OR AS NEEDED   calcium -vitamin D  (OSCAL WITH D) 500-5 MG-MCG tablet Take 1 tablet by mouth daily.   cholecalciferol  (VITAMIN D ) 1000 UNITS tablet Take 1,000 Units by mouth daily.   dicyclomine  (BENTYL ) 10 MG capsule Take 1 capsule (10 mg total) by mouth as needed for spasms.   famotidine  (PEPCID ) 40 MG tablet Take  1 tablet (40 mg total) by mouth daily.   furosemide  (LASIX ) 20 MG tablet TAKE 1 TABLET BY MOUTH DAILY AS  NEEDED   glucose blood (ACCU-CHEK GUIDE TEST) test strip Use as instructed   losartan  (COZAAR ) 50 MG tablet TAKE 1 TABLET BY MOUTH TWICE  DAILY   metFORMIN  (GLUCOPHAGE -XR) 500 MG 24 hr tablet TAKE 1 TABLET BY MOUTH IN THE  MORNING AND 1 TABLET BY MOUTH AT BEDTIME   metoCLOPramide   (REGLAN ) 5 MG tablet TAKE 1 TABLET BY MOUTH TWICE  DAILY AS NEEDED   metoprolol  tartrate (LOPRESSOR ) 50 MG tablet TAKE 1 TABLET BY MOUTH TWICE  DAILY   pantoprazole  (PROTONIX ) 40 MG tablet Take 1 tablet (40 mg total) by mouth daily.   potassium chloride  (KLOR-CON  M) 10 MEQ tablet TAKE 1 TABLET BY MOUTH DAILY   rosuvastatin  (CRESTOR ) 20 MG tablet TAKE 1 TABLET BY MOUTH DAILY   thiamine  (VITAMIN B-1) 100 MG tablet Take 100 mg by mouth every other day.   vitamin E 180 MG (400 UNITS) capsule Take 400 Units by mouth daily.   Past Medical History:  Diagnosis Date   Abdominal pain in female 03/18/2010   Qualifier: Diagnosis of  By: Avram MD, NOLIA Pitts E    Anemia 06/08/2014   Anxiety    Arthritis    Spinal Osteoarthritis   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 09/05/2006   Qualifier: Diagnosis of  By: Wilhemina RMA, Lucy     Diabetic peripheral neuropathy (HCC) 10/29/2013   Encounter for preventative adult health care exam with abnormal findings 09/14/2013   Esophageal reflux    Gastric polyp    Fundic Gland   Gastroparesis    Headache(784.0)    Heart murmur    Echocardiogram 2/11: EF 60-65%, mild LAE, grade 1 diastolic dysfunction, aortic valve sclerosis, mean gradient 9 mm of mercury, PASP 34   Hematuria 03/16/2016   Iron  deficiency anemia, unspecified    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   Leg swelling    bilateral   Neck pain 04/22/2015   PONV (postoperative nausea and vomiting)    pt states body temperature drops every time she has anesthesia; pt states only needs small amount of anesthesia   PSVT (paroxysmal supraventricular tachycardia) (HCC)    Pure hypercholesterolemia    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   Renal insufficiency 06/18/2019   pt states  L kidney function very low-functions at  about 20%   Stroke [facility])    tia, 2014   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   Type II or unspecified type diabetes mellitus without mention of complication, not stated as uncontrolled    Unspecified essential hypertension    Unspecified hereditary and idiopathic peripheral neuropathy 10/29/2013   Vitamin D  deficiency    Past Surgical History:  Procedure Laterality Date   BREAST BIOPSY Right 06/13/2022   US  RT BREAST BX W LOC DEV 1ST LESION IMG BX SPEC US  GUIDE 06/13/2022 [facility]MOGRAP[facility]   BREAST LUMPECTOMY Right 07/31/2022   Procedure: RIGHT BREAST LUMPECTOMY;  Surgeon: Vernetta Berg, MD;  Location: [facility];  Service: General;  Laterality: Right;   CHOLECYSTECTOMY  1993   COLONOSCOPY  11/11/2010   diverticulosis   DILATATION & CURRETTAGE/HYSTEROSCOPY WITH RESECTOCOPE N/A 02/25/2013  Procedure: Attempted hysteroscopy with uterine perforation;  Surgeon: Bobie FORBES Crown de Charlynn FORBES Cary, MD;  Location: [facility];  Service: Gynecology;  Laterality: N/A;   ESOPHAGOGASTRODUODENOSCOPY  08/29/2010; 09/15/2010   Carcinoid tumor less than 1 cm in July 2012 not seen in August 2012 , gastritis, fundic gland polyps   ESOPHAGOGASTRODUODENOSCOPY  05/16/2011   ESOPHAGOGASTRODUODENOSCOPY  06/14/2012   EUS  12/15/2010   Procedure: UPPER ENDOSCOPIC ULTRASOUND (EUS) LINEAR;  Surgeon: Toribio Cedar, MD;  Location[facility];  Service: Endoscopy;  Laterality: N/A;   EYE SURGERY Bilateral    Bi lateral cateracts and bi lateral laser   LAPAROSCOPY N/A 02/25/2013   Procedure: Cystoscopy and laparoscopy with fulguration of uterine serosa;  Surgeon: Bobie FORBES Crown de Charlynn FORBES Cary, MD;  Location: [facility];  Service: Gynecology;  Laterality: N/A;   TONSILLECTOMY     Social History   Social History Narrative   Patient was married Programme researcher, broadcasting/film/video) - widow   Patient does not have any children.   Patient is right-handed.   Patient has a BA degree.   One caffeine drink daily    family history includes Diabetes in  her brother, maternal grandmother, mother, sister, and sister; Heart disease in her brother, brother, sister, sister, and sister; Hyperlipidemia in her brother, sister, and sister; Hypertension in her brother, paternal grandmother, sister, and sister; Intellectual disability in her brother; Stroke in her father.   Review of Systems As per HPI  Objective:   Physical Exam BP 110/60   Pulse (!) 55   Ht 5' 1 (1.549 m)   Wt 166 lb 9.6 oz (75.6 kg)   LMP 02/07/1992   BMI 31.48 kg/m  No signs of tardive dyskinesia

## 2023-10-03 NOTE — Patient Instructions (Addendum)
  VISIT SUMMARY: Today, we discussed your ongoing issues with gastroparesis, including your persistent symptoms of stomach fullness and dietary intolerance. We reviewed your current management plan and made some recommendations to help you manage your condition more effectively. We also reviewed the findings from your previous upper endoscopy, including the presence of benign fundic gland polyps and mild gastropathy.  YOUR PLAN: -GASTROPARESIS: Gastroparesis is a condition where the stomach muscles and nerves do not function properly, leading to delayed stomach emptying. To manage this, continue taking Reglan  as needed, primarily in the morning and at dinner. Avoid foods that worsen your symptoms, such as citrus, meat, and salads. Stick to easily digestible foods like fish. We discussed buspirone as a potential treatment if your symptoms worsen, but it is not recommended at this time due to potential side effects.  -FUNDIC GLAND POLYPS OF STOMACH: Fundic gland polyps are benign growths in the stomach lining that were identified during your upper endoscopy. They are not causing any symptoms or complications, so no treatment is needed at this time.  -GASTROPATHY: Gastropathy is a mild irritation of the stomach lining observed during your upper endoscopy. It is not causing significant symptoms or complications and is not the source of your current symptoms, so no treatment is needed at this time.  INSTRUCTIONS: Please continue with your current dietary modifications and medication regimen. If your symptoms worsen or you have any concerns,  let me know.  I appreciate the opportunity to care for you. [name] COME, [facility]  I appreciate the opportunity to care for you. Valerie CHARLENA Avram, MD, [facility]                    Contains text generated by Abridge.                                 Contains text generated by Abridge.

## 2023-10-11 ENCOUNTER — Encounter: Payer: Self-pay | Admitting: Hematology & Oncology

## 2023-10-11 ENCOUNTER — Inpatient Hospital Stay: Admitting: Hematology & Oncology

## 2023-10-11 ENCOUNTER — Inpatient Hospital Stay: Attending: Hematology & Oncology

## 2023-10-11 ENCOUNTER — Ambulatory Visit: Payer: Self-pay | Admitting: Hematology & Oncology

## 2023-10-11 VITALS — BP 118/41 | HR 53 | Temp 97.9°F | Resp 20 | Ht 61.0 in | Wt 167.0 lb

## 2023-10-11 DIAGNOSIS — C50411 Malignant neoplasm of upper-outer quadrant of right female breast: Secondary | ICD-10-CM | POA: Diagnosis not present

## 2023-10-11 DIAGNOSIS — Z1721 Progesterone receptor positive status: Secondary | ICD-10-CM | POA: Insufficient documentation

## 2023-10-11 DIAGNOSIS — D509 Iron deficiency anemia, unspecified: Secondary | ICD-10-CM | POA: Diagnosis not present

## 2023-10-11 DIAGNOSIS — Z79899 Other long term (current) drug therapy: Secondary | ICD-10-CM | POA: Insufficient documentation

## 2023-10-11 DIAGNOSIS — Z7984 Long term (current) use of oral hypoglycemic drugs: Secondary | ICD-10-CM | POA: Diagnosis not present

## 2023-10-11 DIAGNOSIS — Z7982 Long term (current) use of aspirin: Secondary | ICD-10-CM | POA: Insufficient documentation

## 2023-10-11 DIAGNOSIS — Z1732 Human epidermal growth factor receptor 2 negative status: Secondary | ICD-10-CM | POA: Insufficient documentation

## 2023-10-11 DIAGNOSIS — Z79811 Long term (current) use of aromatase inhibitors: Secondary | ICD-10-CM | POA: Insufficient documentation

## 2023-10-11 DIAGNOSIS — Z17411 Hormone receptor positive with human epidermal growth factor receptor 2 negative status: Secondary | ICD-10-CM | POA: Diagnosis not present

## 2023-10-11 DIAGNOSIS — K909 Intestinal malabsorption, unspecified: Secondary | ICD-10-CM

## 2023-10-11 DIAGNOSIS — Z170 Estrogen receptor positive status [ER+]: Secondary | ICD-10-CM | POA: Diagnosis not present

## 2023-10-11 LAB — CMP (CANCER CENTER ONLY)
ALT: 28 U/L (ref 0–44)
AST: 29 U/L (ref 15–41)
Albumin: 4.5 g/dL (ref 3.5–5.0)
Alkaline Phosphatase: 66 U/L (ref 38–126)
Anion gap: 14 (ref 5–15)
BUN: 21 mg/dL (ref 8–23)
CO2: 21 mmol/L — ABNORMAL LOW (ref 22–32)
Calcium: 9.7 mg/dL (ref 8.9–10.3)
Chloride: 97 mmol/L — ABNORMAL LOW (ref 98–111)
Creatinine: 0.86 mg/dL (ref 0.44–1.00)
GFR, Estimated: >60 mL/min (ref >60)
Glucose, Bld: 183 mg/dL — ABNORMAL HIGH (ref 70–99)
Potassium: 4.3 mmol/L (ref 3.5–5.1)
Sodium: 131 mmol/L — ABNORMAL LOW (ref 135–145)
Total Bilirubin: 0.4 mg/dL (ref 0.0–1.2)
Total Protein: 7.1 g/dL (ref 6.5–8.1)

## 2023-10-11 LAB — CBC WITH DIFFERENTIAL (CANCER CENTER ONLY)
Abs Immature Granulocytes: 0.03 K/uL (ref 0.00–0.07)
Basophils Absolute: 0 K/uL (ref 0.0–0.1)
Basophils Relative: 1 %
Eosinophils Absolute: 0.1 K/uL (ref 0.0–0.5)
Eosinophils Relative: 1 %
HCT: 37.1 % (ref 36.0–46.0)
Hemoglobin: 12.6 g/dL (ref 12.0–15.0)
Immature Granulocytes: 0 %
Lymphocytes Relative: 31 %
Lymphs Abs: 2.3 K/uL (ref 0.7–4.0)
MCH: 28.3 pg (ref 26.0–34.0)
MCHC: 34 g/dL (ref 30.0–36.0)
MCV: 83.4 fL (ref 80.0–100.0)
Monocytes Absolute: 0.6 K/uL (ref 0.1–1.0)
Monocytes Relative: 9 %
Neutro Abs: 4.2 K/uL (ref 1.7–7.7)
Neutrophils Relative %: 58 %
Platelet Count: 232 K/uL (ref 150–400)
RBC: 4.45 MIL/uL (ref 3.87–5.11)
RDW: 12.4 % (ref 11.5–15.5)
WBC Count: 7.3 K/uL (ref 4.0–10.5)
nRBC: 0 % (ref 0.0–0.2)

## 2023-10-11 LAB — RETICULOCYTES
Immature Retic Fract: 4.2 % (ref 2.3–15.9)
RBC.: 4.39 MIL/uL (ref 3.87–5.11)
Retic Count, Absolute: 66.3 K/uL (ref 19.0–186.0)
Retic Ct Pct: 1.5 % (ref 0.4–3.1)

## 2023-10-11 LAB — IRON AND IRON BINDING CAPACITY (CC-WL,HP ONLY)
Iron: 57 ug/dL (ref 28–170)
Saturation Ratios: 15 % (ref 10.4–31.8)
TIBC: 378 ug/dL (ref 250–450)
UIBC: 321 ug/dL

## 2023-10-11 LAB — FERRITIN: Ferritin: 335 ng/mL — ABNORMAL HIGH (ref 11–307)

## 2023-10-11 NOTE — Progress Notes (Signed)
 Hematology and Oncology Follow Up Visit  [name] [id number] [birth year] 83 y.o. 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 daily - d/c on 05/24/2023 Venofer  --  last given 08/16/2023      Interim History:  [name] is back for follow-up.  She is still doing quite well.  She continues to be off any treatment for breast cancer.  She cannot tolerate the aromatase inhibitors.  We did give her some iron  back on 08/16/2023.  At that time, her iron  saturation was 20%.  With her hemoglobin dropping, I thought that it would be a good idea to give her some iron .  This truly worked.  Her hemoglobin is up to 12.6 now.  She has had no change in bowel or bladder habits.  She has had no nausea or vomiting.  She has had no rashes.  Has been no leg swelling.  She has had no cough.  She has had no headache.  Overall, I would say her performance status is probably ECOG 1.   Medications:  Current Outpatient Medications:    acetaminophen  (TYLENOL ) 325 MG tablet, Take 2 tablets (650 mg total) by mouth every 6 (six) hours as needed for up to 30 doses for mild pain or moderate pain., Disp: 30 tablet, Rfl: 0   amLODipine  (NORVASC ) 5 MG tablet, TAKE 1 TABLET BY MOUTH DAILY, Disp: 100 tablet, Rfl: 2   aspirin  EC 81 MG tablet, Take 81 mg by mouth daily. Swallow whole., Disp: , Rfl:    Blood Glucose Monitoring Suppl (ACCU-CHEK GUIDE ME) w/Device KIT, CHECK BLOOD SUGAR 3 TIMES DAILY OR AS NEEDED, Disp: 1 kit, Rfl: 0   calcium -vitamin D  (OSCAL WITH D) 500-5 MG-MCG tablet, Take 1 tablet by mouth daily., Disp: , Rfl:    cholecalciferol  (VITAMIN D ) 1000 UNITS tablet, Take 1,000 Units by mouth daily., Disp: , Rfl:    dicyclomine   (BENTYL ) 10 MG capsule, Take 1 capsule (10 mg total) by mouth as needed for spasms., Disp: 60 capsule, Rfl: 0   famotidine  (PEPCID ) 40 MG tablet, Take 1 tablet (40 mg total) by mouth daily., Disp: 30 tablet, Rfl: 4   furosemide  (LASIX ) 20 MG tablet, TAKE 1 TABLET BY MOUTH DAILY AS  NEEDED, Disp: 100 tablet, Rfl: 2   glucose blood (ACCU-CHEK GUIDE TEST) test strip, Use as instructed, Disp: 100 each, Rfl: 4   losartan  (COZAAR ) 50 MG tablet, TAKE 1 TABLET BY MOUTH TWICE  DAILY, Disp: 200 tablet, Rfl: 2   metFORMIN  (GLUCOPHAGE -XR) 500 MG 24 hr tablet, TAKE 1 TABLET BY MOUTH IN THE  MORNING AND 1 TABLET BY MOUTH AT BEDTIME, Disp: 200 tablet, Rfl: 2   metoCLOPramide  (REGLAN ) 5 MG tablet, TAKE 1 TABLET BY MOUTH TWICE  DAILY AS NEEDED, Disp: 180 tablet, Rfl: 2   metoprolol  tartrate (LOPRESSOR ) 50 MG tablet, TAKE 1 TABLET BY MOUTH TWICE  DAILY, Disp: 200 tablet, Rfl: 3   pantoprazole  (PROTONIX ) 40 MG tablet, Take 1 tablet (40 mg total) by mouth daily., Disp: 90 tablet, Rfl: 3   potassium chloride  (KLOR-CON  M) 10 MEQ tablet, TAKE 1 TABLET BY MOUTH DAILY, Disp: 100 tablet, Rfl: 2   rosuvastatin  (CRESTOR ) 20 MG tablet, TAKE 1 TABLET BY MOUTH  DAILY, Disp: 100 tablet, Rfl: 1   vitamin E 180 MG (400 UNITS) capsule, Take 400 Units by mouth daily., Disp: , Rfl:    thiamine  (VITAMIN B-1) 100 MG tablet, Take 100 mg by mouth every other day. (Patient not taking: Reported on 10/11/2023), Disp: , Rfl:   Allergies:  Allergies  Allergen Reactions   Tramadol  Other (See Comments)    Dizziness     Past Medical History, Surgical history, Social history, and Family History were reviewed and updated.  Review of Systems: Review of Systems  Constitutional: Negative.   HENT:  Negative.    Eyes: Negative.   Respiratory: Negative.    Cardiovascular: Negative.   Gastrointestinal: Negative.   Endocrine: Negative.   Genitourinary: Negative.    Musculoskeletal: Negative.   Skin: Negative.   Neurological: Negative.    Hematological: Negative.   Psychiatric/Behavioral: Negative.      Physical Exam:  height is 5' 1 (1.549 m) and weight is 167 lb (75.8 kg). Her oral temperature is 97.9 F (36.6 C). Her blood pressure is 118/41 (abnormal) and her pulse is 53 (abnormal). Her respiration is 20 and oxygen saturation is 98%.   Wt Readings from Last 3 Encounters:  10/11/23 167 lb (75.8 kg)  10/03/23 166 lb 9.6 oz (75.6 kg)  09/21/23 165 lb 6.4 oz (75 kg)    Physical Exam Vitals reviewed.  Constitutional:      Comments: Left breast with no masses, edema or erythema.  There is no left axillary adenopathy.  Right breast shows the lumpectomy incision  at about the 9 o'clock position.   There is no distinct mass.  There is no palpable right axillary adenopathy.  HENT:     Head: Normocephalic and atraumatic.  Eyes:     Pupils: Pupils are equal, round, and reactive to light.  Cardiovascular:     Rate and Rhythm: Normal rate and regular rhythm.     Heart sounds: Normal heart sounds.  Pulmonary:     Effort: Pulmonary effort is normal.     Breath sounds: Normal breath sounds.  Abdominal:     General: Bowel sounds are normal.     Palpations: Abdomen is soft.  Musculoskeletal:        General: No tenderness or deformity. Normal range of motion.     Cervical back: Normal range of motion.  Lymphadenopathy:     Cervical: No cervical adenopathy.  Skin:    General: Skin is warm and dry.     Findings: No erythema or rash.  Neurological:     Mental Status: She is alert and oriented to person, place, and time.  Psychiatric:        Behavior: Behavior normal.        Thought Content: Thought content normal.        Judgment: Judgment normal.     Lab Results  Component Value Date   WBC 7.3 10/11/2023   HGB 12.6 10/11/2023   HCT 37.1 10/11/2023   MCV 83.4 10/11/2023   PLT 232 10/11/2023     Chemistry      Component Value Date/Time   NA 131 (L) 10/11/2023 1020   NA 136 05/08/2016 1305   NA 134 (L)  07/08/2015 1149   K 4.3 10/11/2023 1020   K 4.2 05/08/2016 1305   K 4.4 07/08/2015 1149   CL 97 (L) 10/11/2023 1020   CL 102 05/08/2016 1305   CO2 21 (L) 10/11/2023 1020   CO2 26 05/08/2016 1305  CO2 25 07/08/2015 1149   BUN 21 10/11/2023 1020   BUN 16 05/08/2016 1305   BUN 15.5 07/08/2015 1149   CREATININE 0.86 10/11/2023 1020   CREATININE 0.81 07/19/2023 0938   CREATININE 0.8 07/08/2015 1149      Component Value Date/Time   CALCIUM  9.7 10/11/2023 1020   CALCIUM  9.4 05/08/2016 1305   CALCIUM  10.0 07/08/2015 1149   ALKPHOS 66 10/11/2023 1020   ALKPHOS 56 05/08/2016 1305   ALKPHOS 62 07/08/2015 1149   AST 29 10/11/2023 1020   AST 24 07/08/2015 1149   ALT 28 10/11/2023 1020   ALT 29 05/08/2016 1305   ALT 30 07/08/2015 1149   BILITOT 0.4 10/11/2023 1020   BILITOT 0.64 07/08/2015 1149      Impression and Plan: Ms. Califf is a very charming and very youthful appearing 83 year old [place] female.  She has history of stage I infiltrating ductal carcinoma of the right breast.  She had a low Oncotype score.  She did not require radiotherapy.  I am glad that she is feeling better.  We will continue to hold her treatment for the breast cancer.  Hopefully, she will never need to be placed on any treatment.  I am glad that the iron  worked.  We will see what the iron  levels are.  I think we can probably move her appointments out to 3 months now.  Will try to get her back around the Holiday season.    Maude JONELLE Crease, MD 9/4/202510:54 AM

## 2023-10-12 LAB — CANCER ANTIGEN 27.29: CA 27.29: 26 U/mL (ref 0.0–38.6)

## 2023-10-15 ENCOUNTER — Telehealth: Payer: Self-pay | Admitting: Hematology & Oncology

## 2023-10-15 NOTE — Telephone Encounter (Signed)
 Called to schedule infusions per ibasket. Unable to LVM to return call for scheduling.

## 2023-10-16 ENCOUNTER — Telehealth: Payer: Self-pay

## 2023-10-16 NOTE — Telephone Encounter (Signed)
 Pt called asking if she could just do one dose of IV iron  and see if that helps. MD okay with this. Pt and scheduling notified.

## 2023-10-23 ENCOUNTER — Ambulatory Visit (INDEPENDENT_AMBULATORY_CARE_PROVIDER_SITE_OTHER): Admitting: *Deleted

## 2023-10-23 VITALS — Ht 61.0 in | Wt 165.0 lb

## 2023-10-23 DIAGNOSIS — Z0000 Encounter for general adult medical examination without abnormal findings: Secondary | ICD-10-CM | POA: Diagnosis not present

## 2023-10-23 NOTE — Patient Instructions (Addendum)
 [name] , Thank you for taking time out of your busy schedule to complete your Annual Wellness Visit with me. I enjoyed our conversation and look forward to speaking with you again next year. I, as well as your care team,  appreciate your ongoing commitment to your health goals. Please review the following plan we discussed and let me know if I can assist you in the future. Your Game plan/ To Do List    Follow up Visits: Next Medicare AWV with our clinical staff: 10/28/24 3pm, telephone    Next Office Visit with your provider: 11/05/23 10am, Dr Domenica. Can get flu vaccine at this visit.  Clinician Recommendations:  Aim for 30 minutes of exercise or brisk walking, 6-8 glasses of water, and 5 servings of fruits and vegetables each day.   You will need to get the following vaccines at your local pharmacy: Tetanus and shingles.  This is a list of the screening recommended for you and due dates:  Health Maintenance  Topic Date Due   Zoster (Shingles) Vaccine (1 of 2) Never done   DTaP/Tdap/Td vaccine (2 - Tdap) 03/03/2015   Complete foot exam   11/13/2015   Flu Shot  09/07/2023   Medicare Annual Wellness Visit  09/07/2023   COVID-19 Vaccine (4 - 2025-26 season) 10/22/2024*   Hemoglobin A1C  02/09/2024   Yearly kidney health urinalysis for diabetes  04/22/2024   Eye exam for diabetics  05/30/2024   Yearly kidney function blood test for diabetes  10/10/2024   Pneumococcal Vaccine for age over 83  Completed   DEXA scan (bone density measurement)  Completed   HPV Vaccine  Aged Out   Meningitis B Vaccine  Aged Out   Hepatitis C Screening  Discontinued  *Topic was postponed. The date shown is not the original due date.    Advanced directives: (ACP Link)Information on Advanced Care Planning can be found at   Secretary of [facility] Advance Health Care Directives Advance Health Care Directives. [email or web address]  Advance Care Planning is important because it:  [x]  Makes sure you receive  the medical care that is consistent with your values, goals, and preferences  [x]  It provides guidance to your family and loved ones and reduces their decisional burden about whether or not they are making the right decisions based on your wishes.  Follow the link provided in your after visit summary or read over the paperwork we have mailed to you to help you started getting your Advance Directives in place. If you need assistance in completing these, please reach out to us  so that we can help you!  See attachments for Preventive Care and Fall Prevention Tips.

## 2023-10-23 NOTE — Progress Notes (Signed)
 Please attest this visit in the absence of patient primary care provider.    Subjective:   [name] is a 83 y.o. who presents for a Medicare Wellness preventive visit.  As a reminder, Annual Wellness Visits don't include a physical exam, and some assessments may be limited, especially if this visit is performed virtually. We may recommend an in-person follow-up visit with your provider if needed.  Visit Complete: Virtual I connected with  [name] on 10/23/23 by a audio enabled telemedicine application and verified that I am speaking with the correct person using two identifiers.  Patient Location: Home  Provider Location: Office/Clinic  I discussed the limitations of evaluation and management by telemedicine. The patient expressed understanding and agreed to proceed.  Vital Signs: Because this visit was a virtual/telehealth visit, some criteria may be missing or patient reported. Any vitals not documented were not able to be obtained and vitals that have been documented are patient reported.  VideoDeclined- This patient declined Librarian, academic. Therefore the visit was completed with audio only.  Persons Participating in Visit: Patient.  AWV Questionnaire: No: Patient Medicare AWV questionnaire was not completed prior to this visit.  Cardiac Risk Factors include: advanced age (>70men, >14 women);hypertension;diabetes mellitus;dyslipidemia;Other (see comment), Risk factor comments: SVT, aortic stenosis,TIA, Breast CA, carotid stenosis     Objective:    Today's Vitals   10/23/23 1504  Weight: 165 lb (74.8 kg)  Height: 5' 1 (1.549 m)   Body mass index is 31.18 kg/m.     10/23/2023    3:17 PM 10/11/2023   10:40 AM 08/09/2023   10:38 AM 06/29/2023   10:45 AM 05/23/2023   12:30 PM 04/03/2023    2:55 PM 03/09/2023    3:36 PM  Advanced Directives  Does Patient Have a Medical Advance Directive? No No No No No No No  Would patient  like information on creating a medical advance directive? No - Patient declined No - Patient declined No - Patient declined No - Patient declined No - Patient declined No - Patient declined No - Patient declined    Current Medications (verified) Outpatient Encounter Medications as of 10/23/2023  Medication Sig   acetaminophen  (TYLENOL ) 325 MG tablet Take 2 tablets (650 mg total) by mouth every 6 (six) hours as needed for up to 30 doses for mild pain or moderate pain.   amLODipine  (NORVASC ) 5 MG tablet TAKE 1 TABLET BY MOUTH DAILY   aspirin  EC 81 MG tablet Take 81 mg by mouth daily. Swallow whole.   calcium -vitamin D  (OSCAL WITH D) 500-5 MG-MCG tablet Take 1 tablet by mouth daily.   cholecalciferol  (VITAMIN D ) 1000 UNITS tablet Take 1,000 Units by mouth daily.   dicyclomine  (BENTYL ) 10 MG capsule Take 1 capsule (10 mg total) by mouth as needed for spasms.   furosemide  (LASIX ) 20 MG tablet TAKE 1 TABLET BY MOUTH DAILY AS  NEEDED   glucose blood (ACCU-CHEK GUIDE TEST) test strip Use as instructed   losartan  (COZAAR ) 50 MG tablet TAKE 1 TABLET BY MOUTH TWICE  DAILY   metFORMIN  (GLUCOPHAGE -XR) 500 MG 24 hr tablet TAKE 1 TABLET BY MOUTH IN THE  MORNING AND 1 TABLET BY MOUTH AT BEDTIME (Patient taking differently: Twice a day before meals)   metoCLOPramide  (REGLAN ) 5 MG tablet TAKE 1 TABLET BY MOUTH TWICE  DAILY AS NEEDED   metoprolol  tartrate (LOPRESSOR ) 50 MG tablet TAKE 1 TABLET BY MOUTH TWICE  DAILY   pantoprazole  (PROTONIX )  40 MG tablet Take 1 tablet (40 mg total) by mouth daily.   potassium chloride  (KLOR-CON  M) 10 MEQ tablet TAKE 1 TABLET BY MOUTH DAILY   rosuvastatin  (CRESTOR ) 20 MG tablet TAKE 1 TABLET BY MOUTH DAILY   vitamin E 180 MG (400 UNITS) capsule Take 400 Units by mouth daily.   Blood Glucose Monitoring Suppl (ACCU-CHEK GUIDE ME) w/Device KIT CHECK BLOOD SUGAR 3 TIMES DAILY OR AS NEEDED   thiamine  (VITAMIN B-1) 100 MG tablet Take 100 mg by mouth every other day. (Patient not taking:  Reported on 10/23/2023)   [DISCONTINUED] famotidine  (PEPCID ) 40 MG tablet Take 1 tablet (40 mg total) by mouth daily. (Patient not taking: Reported on 10/23/2023)   No facility-administered encounter medications on file as of 10/23/2023.    Allergies (verified) Tramadol    History: Past Medical History:  Diagnosis Date   Abdominal pain in female 03/18/2010   Qualifier: Diagnosis of  By: Avram MD, NOLIA Pitts E    Anemia 06/08/2014   Anxiety    Arthritis    Spinal Osteoarthritis   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 09/05/2006   Qualifier: Diagnosis of  By: Wilhemina RMA, Lucy     Diabetic peripheral neuropathy (HCC) 10/29/2013   Encounter for preventative adult health care exam with abnormal findings 09/14/2013   Esophageal reflux    Gastric polyp    Fundic Gland   Gastroparesis    Headache(784.0)    Heart murmur    Echocardiogram 2/11: EF 60-65%, mild LAE, grade 1 diastolic dysfunction, aortic valve sclerosis, mean gradient 9 mm of mercury, PASP 34   Hematuria 03/16/2016   Iron  deficiency anemia, unspecified    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   Leg swelling    bilateral   Neck pain 04/22/2015   PONV (postoperative nausea and vomiting)    pt states body temperature drops every time she has anesthesia; pt states only needs small amount of anesthesia   PSVT (paroxysmal supraventricular tachycardia) (HCC)    Pure hypercholesterolemia    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   Renal insufficiency 06/18/2019   pt states  L kidney function very low-functions at about 20%   Stroke [facility])    tia, 2014   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   Type II or unspecified type diabetes mellitus without mention of complication, not stated as uncontrolled    Unspecified essential hypertension    Unspecified hereditary and  idiopathic peripheral neuropathy 10/29/2013   Vitamin D  deficiency    Past Surgical History:  Procedure Laterality Date   BREAST BIOPSY Right 06/13/2022   US  RT BREAST BX W LOC DEV 1ST LESION IMG BX SPEC US  GUIDE 06/13/2022 [facility] MAMMOGRAPHY   BREAST LUMPECTOMY Right 07/31/2022   Procedure: RIGHT BREAST LUMPECTOMY;  Surgeon: Vernetta Berg, MD;  Location: [facility];  Service: General;  Laterality: Right;   CHOLECYSTECTOMY  1993   COLONOSCOPY  11/11/2010   diverticulosis   DILATATION & CURRETTAGE/HYSTEROSCOPY WITH RESECTOCOPE N/A 02/25/2013   Procedure: Attempted hysteroscopy with uterine perforation;  Surgeon: Bobie FORBES Crown de Charlynn FORBES Cary, MD;  Location: [facility];  Service: Gynecology;  Laterality: N/A;   ESOPHAGOGASTRODUODENOSCOPY  08/29/2010; 09/15/2010   Carcinoid tumor less than 1 cm in July 2012 not seen in  August 2012 , gastritis, fundic gland polyps   ESOPHAGOGASTRODUODENOSCOPY  05/16/2011   ESOPHAGOGASTRODUODENOSCOPY  06/14/2012   EUS  12/15/2010   Procedure: UPPER ENDOSCOPIC ULTRASOUND (EUS) LINEAR;  Surgeon: Toribio Cedar, MD;  Location: [facility];  Service: Endoscopy;  Laterality: N/A;   EYE SURGERY Bilateral    Bi lateral cateracts and bi lateral laser   LAPAROSCOPY N/A 02/25/2013   Procedure: Cystoscopy and laparoscopy with fulguration of uterine serosa;  Surgeon: Bobie FORBES Crown de Charlynn FORBES Cary, MD;  Location: [facility];  Service: Gynecology;  Laterality: N/A;   TONSILLECTOMY     Family History  Problem Relation Age of Onset   Diabetes Mother    Stroke Father        deceased age 76   Heart disease Sister        deceased MI age 61   Diabetes Sister    Heart disease Sister    Hypertension Sister    Hyperlipidemia Sister    Diabetes Sister    Heart disease Sister    Hypertension Sister    Hyperlipidemia Sister    Heart disease Brother        deceased MI age 40   Intellectual disability Brother    Diabetes Brother    Heart disease Brother     Hypertension Brother    Hyperlipidemia Brother    Diabetes Maternal Grandmother    Hypertension Paternal Grandmother    Colon cancer Neg Hx    Esophageal cancer Neg Hx    Stomach cancer Neg Hx    Rectal cancer Neg Hx    Social History   Socioeconomic History   Marital status: Widowed    Spouse name: Not on file   Number of children: 0   Years of education: college   Highest education level: Not on file  Occupational History   Occupation: retired  Tobacco Use   Smoking status: Never   Smokeless tobacco: Never   Tobacco comments:    Never used tobacco  Vaping Use   Vaping status: Never Used  Substance and Sexual Activity   Alcohol  use: Yes    Comment: ocassional glass of wine   Drug use: No   Sexual activity: Not Currently    Partners: Male    Birth control/protection: Post-menopausal    Comment: lives alone, no dietary restrictions except avoid fresh veg, fruit, whole grains  Other Topics Concern   Not on file  Social History Narrative   Patient was married ([name]) - widow   Patient does not have any children.   Patient is right-handed.   Patient has a BA degree.   One caffeine drink daily    Social Drivers of Corporate investment banker Strain: Low Risk  (10/23/2023)   Overall Financial Resource Strain (CARDIA)    Difficulty of Paying Living Expenses: Not very hard  Food Insecurity: No Food Insecurity (10/23/2023)   Hunger Vital Sign    Worried About Running Out of Food in the Last Year: Never true    Ran Out of Food in the Last Year: Never true  Transportation Needs: No Transportation Needs (10/23/2023)   PRAPARE - Administrator, Civil Service (Medical): No    Lack of Transportation (Non-Medical): No  Physical Activity: Insufficiently Active (10/23/2023)   Exercise Vital Sign    Days of Exercise per Week: 7 days    Minutes of Exercise per Session: 20 min  Stress: No Stress Concern Present (10/23/2023)   [employer] of  Occupational Health -  Occupational Stress Questionnaire    Feeling of Stress: Not at all  Social Connections: Moderately Isolated (10/23/2023)   Social Connection and Isolation Panel    Frequency of Communication with Friends and Family: More than three times a week    Frequency of Social Gatherings with Friends and Family: Once a week    Attends Religious Services: More than 4 times per year    Active Member of [employer] or Organizations: No    Attends Banker Meetings: Never    Marital Status: Widowed    Tobacco Counseling Counseling given: Not Answered Tobacco comments: Never used tobacco    Clinical Intake:  Pre-visit preparation completed: Yes  Pain : No/denies pain     BMI - recorded: 31.18 Nutritional Status: BMI > 30  Obese Nutritional Risks: None Diabetes: Yes CBG done?: No Did pt. bring in CBG monitor from home?: No  Lab Results  Component Value Date   HGBA1C 6.3 (H) 08/09/2023   HGBA1C 6.6 (H) 07/19/2023   HGBA1C 6.7 (H) 04/16/2023     How often do you need to have someone help you when you read instructions, pamphlets, or other written materials from your doctor or pharmacy?: 1 - Never What is the last grade level you completed in school?: college  Interpreter Needed?: No  Information entered by :: Lolita Libra, [facility])   Activities of Daily Living     10/23/2023    3:13 PM  In your present state of health, do you have any difficulty performing the following activities:  Hearing? 0  Vision? 0  Difficulty concentrating or making decisions? 0  Walking or climbing stairs? 1  Dressing or bathing? 0  Doing errands, shopping? 0  Preparing Food and eating ? N  Using the Toilet? N  In the past six months, have you accidently leaked urine? N  Do you have problems with loss of bowel control? N  Managing your Medications? N  Managing your Finances? N  Housekeeping or managing your Housekeeping? N    Patient Care Team: Domenica Harlene LABOR, MD as PCP - General  (Family Medicine) Lavona Agent, MD as PCP - Cardiology (Cardiology) Avram Lupita BRAVO, MD as Consulting Physician (Gastroenterology) Love, Agent HERO, MD (Neurology) Skeet Juliene SAUNDERS, DO as Consulting Physician (Neurology) Carla Milling, RPH-CPP (Pharmacist) Timmy Maude SAUNDERS, MD as Medical Oncologist (Oncology) Marcey Elspeth PARAS, MD as Consulting Physician (Ophthalmology)  I have updated your Care Teams any recent Medical Services you may have received from other providers in the past year.     Assessment:   This is a routine wellness examination for [name].  Hearing/Vision screen Hearing Screening - Comments:: Denies hearing difficulties.  Vision Screening - Comments:: Up to date with routine eye exams with [facility]    Goals Addressed   None    Depression Screen     10/23/2023    3:15 PM 10/11/2023   10:44 AM 09/21/2023    1:59 PM 08/09/2023   10:41 AM 10/05/2022   12:04 PM 09/07/2022    3:06 PM 07/06/2022    2:32 PM  PHQ 2/9 Scores  PHQ - 2 Score 0 0 0 0 0 0 0  PHQ- 9 Score 5      0    Fall Risk     10/23/2023    3:11 PM 09/21/2023    1:59 PM 10/05/2022   12:04 PM 09/07/2022    3:03 PM 07/06/2022    2:32 PM  Fall  Risk   Falls in the past year? 0 0 0 0 0  Number falls in past yr: 0 0 0 0 0  Injury with Fall? 0 0 0 0 0  Risk for fall due to : Impaired balance/gait No Fall Risks  No Fall Risks   Follow up Education provided Falls evaluation completed Falls evaluation completed Falls evaluation completed Falls evaluation completed    MEDICARE RISK AT HOME:  Medicare Risk at Home Any stairs in or around the home?: No If so, are there any without handrails?: No Home free of loose throw rugs in walkways, pet beds, electrical cords, etc?: Yes Adequate lighting in your home to reduce risk of falls?: Yes Life alert?: No Use of a cane, walker or w/c?: No Grab bars in the bathroom?: Yes Shower chair or bench in shower?: Yes Elevated toilet seat or a handicapped toilet?: No  TIMED  UP AND GO:  Was the test performed?  no, audio  Cognitive Function: 6CIT completed    04/24/2017    1:30 PM 01/11/2016    2:10 PM  MMSE - Mini Mental State Exam  Orientation to time 5 5   Orientation to Place 5 5   Registration 3 3   Attention/ Calculation 5 5   Recall 3 0   Language- name 2 objects 2 2   Language- repeat 1 1  Language- follow 3 step command 3 3   Language- read & follow direction 1 1   Write a sentence 1 1   Copy design 1 1   Total score 30 27      Data saved with a previous flowsheet row definition        10/23/2023    3:21 PM 09/07/2022    3:12 PM 06/07/2021    1:09 PM  6CIT Screen  What Year? 0 points 0 points 0 points  What month? 0 points 0 points 0 points  What time? 0 points 0 points 0 points  Count back from 20 0 points 0 points 0 points  Months in reverse 0 points 0 points 4 points  Repeat phrase 4 points 0 points 4 points  Total Score 4 points 0 points 8 points    Immunizations Immunization History  Administered Date(s) Administered   Fluad  Quad(high Dose 65+) 11/18/2019, 12/01/2020, 10/13/2021   Fluad  Trivalent(High Dose 65+) 11/13/2022   INFLUENZA, HIGH DOSE SEASONAL PF 11/11/2015, 11/21/2016   Influenza Split 10/31/2010, 11/28/2011   Influenza Whole 11/19/2007, 11/04/2008, 11/10/2009   Influenza,inj,Quad PF,6+ Mos 01/09/2013, 10/20/2013, 11/13/2014, 11/06/2018   PFIZER(Purple Top)SARS-COV-2 Vaccination 03/14/2019, 04/09/2019, 12/29/2019   Pneumococcal Conjugate-13 09/11/2013   Pneumococcal Polysaccharide-23 03/13/2007   Td 03/02/2005    Screening Tests Health Maintenance  Topic Date Due   Zoster Vaccines- Shingrix (1 of 2) Never done   DTaP/Tdap/Td (2 - Tdap) 03/03/2015   FOOT EXAM  11/13/2015   Influenza Vaccine  09/07/2023   Medicare Annual Wellness (AWV)  09/07/2023   COVID-19 Vaccine (4 - 2025-26 season) 10/22/2024 (Originally 10/08/2023)   HEMOGLOBIN A1C  02/09/2024   Diabetic kidney evaluation - Urine ACR  04/22/2024    OPHTHALMOLOGY EXAM  05/30/2024   Diabetic kidney evaluation - eGFR measurement  10/10/2024   Pneumococcal Vaccine: 50+ Years  Completed   DEXA SCAN  Completed   HPV VACCINES  Aged Out   Meningococcal B Vaccine  Aged Out   Hepatitis C Screening  Discontinued    Health Maintenance Items Addressed: Will get tetanus and shingles vaccines at  pharmacy.  Flu vaccine and DM foot exam at next OV.  Additional Screening:  Vision Screening: Recommended annual ophthalmology exams for early detection of glaucoma and other disorders of the eye. Is the patient up to date with their annual eye exam?  Yes  Who is the provider or what is the name of the office in which the patient attends annual eye exams? [facility]  Dental Screening: Recommended annual dental exams for proper oral hygiene  Community Resource Referral / Chronic Care Management: CRR required this visit?  No   CCM required this visit?  No   Plan:    I have personally reviewed and noted the following in the patient's chart:   Medical and social history Use of alcohol , tobacco or illicit drugs  Current medications and supplements including opioid prescriptions. Patient is not currently taking opioid prescriptions. Functional ability and status Nutritional status Physical activity Advanced directives List of other physicians Hospitalizations, surgeries, and ER visits in previous 12 months Vitals Screenings to include cognitive, depression, and falls Referrals and appointments  In addition, I have reviewed and discussed with patient certain preventive protocols, quality metrics, and best practice recommendations. A written personalized care plan for preventive services as well as general preventive health recommendations were provided to patient.   Lolita Libra, CMA   10/23/2023   After Visit Summary: (MyChart) Due to this being a telephonic visit, the after visit summary with patients personalized plan was offered to  patient via MyChart   Notes: Nothing significant to report at this time.

## 2023-10-24 ENCOUNTER — Ambulatory Visit: Payer: Self-pay | Admitting: Family Medicine

## 2023-10-24 ENCOUNTER — Other Ambulatory Visit (INDEPENDENT_AMBULATORY_CARE_PROVIDER_SITE_OTHER)

## 2023-10-24 DIAGNOSIS — E7800 Pure hypercholesterolemia, unspecified: Secondary | ICD-10-CM

## 2023-10-24 DIAGNOSIS — E559 Vitamin D deficiency, unspecified: Secondary | ICD-10-CM | POA: Diagnosis not present

## 2023-10-24 DIAGNOSIS — I10 Essential (primary) hypertension: Secondary | ICD-10-CM

## 2023-10-24 DIAGNOSIS — E1169 Type 2 diabetes mellitus with other specified complication: Secondary | ICD-10-CM | POA: Diagnosis not present

## 2023-10-24 DIAGNOSIS — E669 Obesity, unspecified: Secondary | ICD-10-CM

## 2023-10-24 LAB — COMPREHENSIVE METABOLIC PANEL WITH GFR
ALT: 22 U/L (ref 0–35)
AST: 21 U/L (ref 0–37)
Albumin: 4.5 g/dL (ref 3.5–5.2)
Alkaline Phosphatase: 54 U/L (ref 39–117)
BUN: 27 mg/dL — ABNORMAL HIGH (ref 6–23)
CO2: 25 meq/L (ref 19–32)
Calcium: 9.6 mg/dL (ref 8.4–10.5)
Chloride: 100 meq/L (ref 96–112)
Creatinine, Ser: 0.77 mg/dL (ref 0.40–1.20)
GFR: 71.77 mL/min (ref >60.00)
Glucose, Bld: 115 mg/dL — ABNORMAL HIGH (ref 70–99)
Potassium: 4.2 meq/L (ref 3.5–5.1)
Sodium: 134 meq/L — ABNORMAL LOW (ref 135–145)
Total Bilirubin: 0.6 mg/dL (ref 0.2–1.2)
Total Protein: 6.7 g/dL (ref 6.0–8.3)

## 2023-10-24 LAB — LIPID PANEL
Cholesterol: 182 mg/dL (ref 0–200)
HDL: 64.2 mg/dL (ref >39.00)
LDL Cholesterol: 94 mg/dL (ref 0–99)
NonHDL: 117.84
Total CHOL/HDL Ratio: 3
Triglycerides: 118 mg/dL (ref 0.0–149.0)
VLDL: 23.6 mg/dL (ref 0.0–40.0)

## 2023-10-24 LAB — CBC WITH DIFFERENTIAL/PLATELET
Basophils Absolute: 0.1 K/uL (ref 0.0–0.1)
Basophils Relative: 0.7 % (ref 0.0–3.0)
Eosinophils Absolute: 0.1 K/uL (ref 0.0–0.7)
Eosinophils Relative: 1.1 % (ref 0.0–5.0)
HCT: 37.7 % (ref 36.0–46.0)
Hemoglobin: 12.7 g/dL (ref 12.0–15.0)
Lymphocytes Relative: 30.3 % (ref 12.0–46.0)
Lymphs Abs: 2.6 K/uL (ref 0.7–4.0)
MCHC: 33.7 g/dL (ref 30.0–36.0)
MCV: 83.3 fl (ref 78.0–100.0)
Monocytes Absolute: 0.7 K/uL (ref 0.1–1.0)
Monocytes Relative: 8.7 % (ref 3.0–12.0)
Neutro Abs: 5 K/uL (ref 1.4–7.7)
Neutrophils Relative %: 59.2 % (ref 43.0–77.0)
Platelets: 198 K/uL (ref 150.0–400.0)
RBC: 4.53 Mil/uL (ref 3.87–5.11)
RDW: 13.1 % (ref 11.5–15.5)
WBC: 8.5 K/uL (ref 4.0–10.5)

## 2023-10-24 LAB — VITAMIN D 25 HYDROXY (VIT D DEFICIENCY, FRACTURES): VITD: 48.85 ng/mL (ref 30.00–100.00)

## 2023-10-24 LAB — TSH: TSH: 1.96 u[IU]/mL (ref 0.35–5.50)

## 2023-10-24 LAB — HEMOGLOBIN A1C: Hgb A1c MFr Bld: 7.1 % — ABNORMAL HIGH (ref 4.6–6.5)

## 2023-10-25 ENCOUNTER — Inpatient Hospital Stay

## 2023-10-25 VITALS — BP 111/50 | HR 56 | Temp 99.0°F | Resp 19

## 2023-10-25 DIAGNOSIS — Z7984 Long term (current) use of oral hypoglycemic drugs: Secondary | ICD-10-CM | POA: Diagnosis not present

## 2023-10-25 DIAGNOSIS — C50411 Malignant neoplasm of upper-outer quadrant of right female breast: Secondary | ICD-10-CM | POA: Diagnosis not present

## 2023-10-25 DIAGNOSIS — D500 Iron deficiency anemia secondary to blood loss (chronic): Secondary | ICD-10-CM

## 2023-10-25 DIAGNOSIS — Z79899 Other long term (current) drug therapy: Secondary | ICD-10-CM | POA: Diagnosis not present

## 2023-10-25 DIAGNOSIS — Z79811 Long term (current) use of aromatase inhibitors: Secondary | ICD-10-CM | POA: Diagnosis not present

## 2023-10-25 DIAGNOSIS — Z1721 Progesterone receptor positive status: Secondary | ICD-10-CM | POA: Diagnosis not present

## 2023-10-25 DIAGNOSIS — D509 Iron deficiency anemia, unspecified: Secondary | ICD-10-CM | POA: Diagnosis not present

## 2023-10-25 DIAGNOSIS — Z1732 Human epidermal growth factor receptor 2 negative status: Secondary | ICD-10-CM | POA: Diagnosis not present

## 2023-10-25 DIAGNOSIS — Z170 Estrogen receptor positive status [ER+]: Secondary | ICD-10-CM | POA: Diagnosis not present

## 2023-10-25 DIAGNOSIS — Z7982 Long term (current) use of aspirin: Secondary | ICD-10-CM | POA: Diagnosis not present

## 2023-10-25 DIAGNOSIS — K909 Intestinal malabsorption, unspecified: Secondary | ICD-10-CM

## 2023-10-25 DIAGNOSIS — Z17411 Hormone receptor positive with human epidermal growth factor receptor 2 negative status: Secondary | ICD-10-CM | POA: Diagnosis not present

## 2023-10-25 MED ORDER — IRON SUCROSE 20 MG/ML IV SOLN
200.0000 mg | Freq: Once | INTRAVENOUS | Status: AC
  Administered 2023-10-25: 200 mg via INTRAVENOUS
  Filled 2023-10-25: qty 10

## 2023-10-25 MED ORDER — SODIUM CHLORIDE 0.9 % IV SOLN: INTRAVENOUS | Status: DC

## 2023-10-25 NOTE — Progress Notes (Signed)
Pt declined to stay for post infusion observation period. Pt stated she has tolerated medication multiple times prior without difficulty. Pt aware to call clinic with any questions or concerns. Pt verbalized understanding and had no further questions.  ? ?

## 2023-10-30 ENCOUNTER — Other Ambulatory Visit: Payer: Self-pay | Admitting: Family Medicine

## 2023-11-03 ENCOUNTER — Other Ambulatory Visit: Payer: Self-pay | Admitting: Gastroenterology

## 2023-11-04 NOTE — Assessment & Plan Note (Signed)
 Encourage heart healthy diet such as MIND or DASH diet, increase exercise, avoid trans fats, simple carbohydrates and processed foods, consider a krill or fish or flaxseed oil cap daily. Tolerating Rosuvastatin

## 2023-11-04 NOTE — Assessment & Plan Note (Signed)
 Well controlled, no changes to meds. Encouraged heart healthy diet such as the DASH diet and exercise as tolerated.

## 2023-11-04 NOTE — Assessment & Plan Note (Signed)
 Supplement and monitor

## 2023-11-04 NOTE — Assessment & Plan Note (Signed)
 Stable but persistent continue to control glucose levels

## 2023-11-04 NOTE — Assessment & Plan Note (Signed)
 hgba1c acceptable, minimize simple carbs. Increase exercise as tolerated. Continue current meds

## 2023-11-04 NOTE — Assessment & Plan Note (Signed)
 Hydrate and monitor

## 2023-11-05 ENCOUNTER — Ambulatory Visit: Admitting: Family Medicine

## 2023-11-05 ENCOUNTER — Encounter: Payer: Self-pay | Admitting: Family Medicine

## 2023-11-05 VITALS — BP 124/70 | HR 60 | Temp 98.1°F | Resp 16 | Ht 61.0 in | Wt 167.8 lb

## 2023-11-05 DIAGNOSIS — I10 Essential (primary) hypertension: Secondary | ICD-10-CM | POA: Diagnosis not present

## 2023-11-05 DIAGNOSIS — E519 Thiamine deficiency, unspecified: Secondary | ICD-10-CM | POA: Diagnosis not present

## 2023-11-05 DIAGNOSIS — Z7984 Long term (current) use of oral hypoglycemic drugs: Secondary | ICD-10-CM

## 2023-11-05 DIAGNOSIS — Z23 Encounter for immunization: Secondary | ICD-10-CM | POA: Diagnosis not present

## 2023-11-05 DIAGNOSIS — E559 Vitamin D deficiency, unspecified: Secondary | ICD-10-CM

## 2023-11-05 DIAGNOSIS — E1142 Type 2 diabetes mellitus with diabetic polyneuropathy: Secondary | ICD-10-CM

## 2023-11-05 DIAGNOSIS — N289 Disorder of kidney and ureter, unspecified: Secondary | ICD-10-CM | POA: Diagnosis not present

## 2023-11-05 DIAGNOSIS — E7800 Pure hypercholesterolemia, unspecified: Secondary | ICD-10-CM | POA: Diagnosis not present

## 2023-11-05 DIAGNOSIS — E669 Obesity, unspecified: Secondary | ICD-10-CM

## 2023-11-05 DIAGNOSIS — R350 Frequency of micturition: Secondary | ICD-10-CM | POA: Diagnosis not present

## 2023-11-05 DIAGNOSIS — E1169 Type 2 diabetes mellitus with other specified complication: Secondary | ICD-10-CM | POA: Diagnosis not present

## 2023-11-05 MED ORDER — ACCU-CHEK SOFTCLIX LANCETS MISC: 4 refills | Status: DC

## 2023-11-05 MED ORDER — FAMOTIDINE 40 MG PO TABS: 40.0000 mg | ORAL_TABLET | Freq: Every evening | ORAL | 1 refills | Status: DC | PRN

## 2023-11-05 NOTE — Progress Notes (Signed)
 Subjective:    [name], female    DOB: [birth year], 83 y.o.   MRN: [id number]  No chief complaint on file.   HPI Discussed the use of AI scribe software for clinical note transcription with the patient, who gave verbal consent to proceed.  History of Present Illness [name] is an 83 year old female with diabetes who presents with leg swelling and neuropathy.  She experiences numbness and heaviness in her feet, particularly affecting her toes and the bottom of her feet. No sores or significant pain, but there is difficulty with sensation. She has not been using compression socks due to difficulty putting them on and has not seen a foot doctor despite her niece's recommendations.  Her diabetes management includes metformin  and Cozaar , with her last A1c reported as 7. She experiences occasional hypoglycemic episodes, especially if she skips meals, with blood sugar levels dropping to 70-75, causing shakiness. Her blood sugar levels are often over 200 at night despite not eating close to bedtime.  She has a history of breast cancer and has experienced significant side effects from two medications, including nausea, exhaustion, hot flashes, and muscle and joint pain. She has decided not to take any medication for breast cancer at this time.  She reports a persistent dry cough that is intermittent and does not wake her at night. No green sputum. She experiences heartburn and alternates between Nexium  and pantoprazole  for management. A recent endoscopy confirmed issues related to her digestive tract.  She experiences urinary frequency, particularly at night, needing to urinate two to three times. No burning, blood, or back pain associated with urination but reports difficulty returning to sleep after waking at night.  She has been experiencing fatigue and has received iron  infusions twice, with a third one being considered. Her iron  levels were previously low  normal, and she feels very tired.  She reports morning congestion and cold-like symptoms, with no green or fecal mucus. No significant allergies but experiences some watery, itchy eyes and throat tickle.    Past Medical History:  Diagnosis Date  . Abdominal pain in female 03/18/2010   Qualifier: Diagnosis of  By: Avram MD, NOLIA Lupita FORBES SABRA Anemia 06/08/2014  . Anxiety   . Arthritis    Spinal Osteoarthritis  . Breast cancer (HCC)   . Carcinoid tumor of stomach   . Cataract   . Chest pain    Myoview  12/15 no ischemia.  . Chronic kidney disease    Left kidney smaller than right kidney  . Constipation 11/21/2016  . Diabetes mellitus type 2 in obese 09/05/2006   Qualifier: Diagnosis of  By: Wilhemina RMA, Lucy    . Diabetic peripheral neuropathy (HCC) 10/29/2013  . Encounter for preventative adult health care exam with abnormal findings 09/14/2013  . Esophageal reflux   . Gastric polyp    Fundic Gland  . Gastroparesis   . Headache(784.0)   . Heart murmur    Echocardiogram 2/11: EF 60-65%, mild LAE, grade 1 diastolic dysfunction, aortic valve sclerosis, mean gradient 9 mm of mercury, PASP 34  . Hematuria 03/16/2016  . Iron  deficiency anemia, unspecified   . 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 Leg swelling    bilateral  . Neck pain 04/22/2015  . PONV (postoperative nausea and vomiting)    pt states body temperature drops every time she has anesthesia; pt states only needs small amount of anesthesia  . PSVT (paroxysmal supraventricular tachycardia)   . Pure  hypercholesterolemia   . 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 Renal insufficiency 06/18/2019   pt states  L kidney function very low-functions at about 20%  . Stroke (HCC)    tia, 2014  . 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 Type II or unspecified type diabetes mellitus without mention of complication, not stated as uncontrolled   . Unspecified essential hypertension   . Unspecified hereditary and idiopathic peripheral neuropathy 10/29/2013  .  Vitamin D  deficiency     Past Surgical History:  Procedure Laterality Date  . BREAST BIOPSY Right 06/13/2022   US  RT BREAST BX W LOC DEV 1ST LESION IMG BX SPEC US  GUIDE 06/13/2022 [facility]  . BREAST LUMPECTOMY Right 07/31/2022   Procedure: RIGHT BREAST LUMPECTOMY;  Surgeon: Vernetta Berg, MD;  Location: [facility];  Service: General;  Laterality: Right;  . CHOLECYSTECTOMY  1993  . COLONOSCOPY  11/11/2010   diverticulosis  . DILATATION & CURRETTAGE/HYSTEROSCOPY WITH RESECTOCOPE N/A 02/25/2013   Procedure: Attempted hysteroscopy with uterine perforation;  Surgeon: Bobie FORBES Crown de Charlynn FORBES Cary, MD;  Location: [facility];  Service: Gynecology;  Laterality: N/A;  . ESOPHAGOGASTRODUODENOSCOPY  08/29/2010; 09/15/2010   Carcinoid tumor less than 1 cm in July 2012 not seen in August 2012 , gastritis, fundic gland polyps  . ESOPHAGOGASTRODUODENOSCOPY  05/16/2011  . ESOPHAGOGASTRODUODENOSCOPY  06/14/2012  . EUS  12/15/2010   Procedure: UPPER ENDOSCOPIC ULTRASOUND (EUS) LINEAR;  Surgeon: Toribio Cedar, MD;  Location: [facility];  Service: Endoscopy;  Laterality: N/A;  . EYE SURGERY Bilateral    Bi lateral cateracts and bi lateral laser  . LAPAROSCOPY N/A 02/25/2013   Procedure: Cystoscopy and laparoscopy with fulguration of uterine serosa;  Surgeon: Bobie FORBES Crown de Charlynn FORBES Cary, MD;  Location: [facility];  Service: Gynecology;  Laterality: N/A;  . TONSILLECTOMY      Family History  Problem Relation Age of Onset  . Diabetes Mother   . Stroke Father        deceased age 1  . Heart disease Sister        deceased MI age 31  . Diabetes Sister   . Heart disease Sister   . Hypertension Sister   . Hyperlipidemia Sister   . Diabetes Sister   . Heart disease Sister   . Hypertension Sister   . Hyperlipidemia Sister   . Heart disease Brother        deceased MI age 20  . Intellectual disability Brother   . Diabetes Brother   . Heart disease Brother   . Hypertension  Brother   . Hyperlipidemia Brother   . Diabetes Maternal Grandmother   . Hypertension Paternal Grandmother   . Colon cancer Neg Hx   . Esophageal cancer Neg Hx   . Stomach cancer Neg Hx   . Rectal cancer Neg Hx     Social History   Socioeconomic History  . Marital status: Widowed    Spouse name: Not on file  . Number of children: 0  . Years of education: college  . Highest education level: Not on file  Occupational History  . Occupation: retired  Tobacco Use  . Smoking status: Never  . Smokeless tobacco: Never  . Tobacco comments:    Never used tobacco  Vaping Use  . Vaping status: Never Used  Substance and Sexual Activity  . Alcohol  use: Yes    Comment: ocassional glass of wine  . Drug use: No  . Sexual activity: Not Currently    Partners:  Male    Birth control/protection: Post-menopausal    Comment: lives alone, no dietary restrictions except avoid fresh veg, fruit, whole grains  Other Topics Concern  . Not on file  Social History Narrative   Patient was married ([name]) - widow   Patient does not have any children.   Patient is right-handed.   Patient has a BA degree.   One caffeine drink daily    Social Drivers of Corporate investment banker Strain: Low Risk  (10/23/2023)   Overall Financial Resource Strain (CARDIA)   . Difficulty of Paying Living Expenses: Not very hard  Food Insecurity: No Food Insecurity (10/23/2023)   Hunger Vital Sign   . Worried About Programme researcher, broadcasting/film/video in the Last Year: Never true   . Ran Out of Food in the Last Year: Never true  Transportation Needs: No Transportation Needs (10/23/2023)   PRAPARE - Transportation   . Lack of Transportation (Medical): No   . Lack of Transportation (Non-Medical): No  Physical Activity: Insufficiently Active (10/23/2023)   Exercise Vital Sign   . Days of Exercise per Week: 7 days   . Minutes of Exercise per Session: 20 min  Stress: No Stress Concern Present (10/23/2023)   [employer] of  Occupational Health - Occupational Stress Questionnaire   . Feeling of Stress: Not at all  Social Connections: Moderately Isolated (10/23/2023)   Social Connection and Isolation Panel   . Frequency of Communication with Friends and Family: More than three times a week   . Frequency of Social Gatherings with Friends and Family: Once a week   . Attends Religious Services: More than 4 times per year   . Active Member of Clubs or Organizations: No   . Attends Banker Meetings: Never   . Marital Status: Widowed  Intimate Partner Violence: Not At Risk (10/23/2023)   Humiliation, Afraid, Rape, and Kick questionnaire   . Fear of Current or Ex-Partner: No   . Emotionally Abused: No   . Physically Abused: No   . Sexually Abused: No    Outpatient Medications Prior to Visit  Medication Sig Dispense Refill  . acetaminophen  (TYLENOL ) 325 MG tablet Take 2 tablets (650 mg total) by mouth every 6 (six) hours as needed for up to 30 doses for mild pain or moderate pain. 30 tablet 0  . amLODipine  (NORVASC ) 5 MG tablet TAKE 1 TABLET BY MOUTH DAILY 100 tablet 2  . aspirin  EC 81 MG tablet Take 81 mg by mouth daily. Swallow whole.    . Blood Glucose Monitoring Suppl (ACCU-CHEK GUIDE ME) w/Device KIT CHECK BLOOD SUGAR 3 TIMES DAILY OR AS NEEDED 1 kit 0  . calcium -vitamin D  (OSCAL WITH D) 500-5 MG-MCG tablet Take 1 tablet by mouth daily.    . cholecalciferol  (VITAMIN D ) 1000 UNITS tablet Take 1,000 Units by mouth daily.    . dicyclomine  (BENTYL ) 10 MG capsule TAKE 1 CAPSULE BY MOUTH AS  NEEDED EVERY 12 HOURS 60 capsule 0  . furosemide  (LASIX ) 20 MG tablet TAKE 1 TABLET BY MOUTH DAILY AS  NEEDED 100 tablet 2  . glucose blood (ACCU-CHEK GUIDE TEST) test strip Use as instructed 100 each 4  . losartan  (COZAAR ) 50 MG tablet TAKE 1 TABLET BY MOUTH TWICE  DAILY 200 tablet 2  . metFORMIN  (GLUCOPHAGE -XR) 500 MG 24 hr tablet TAKE 1 TABLET BY MOUTH IN THE  MORNING AND 1 TABLET BY MOUTH AT BEDTIME (Patient taking  differently: Twice a day before meals) 200  tablet 2  . metoCLOPramide  (REGLAN ) 5 MG tablet TAKE 1 TABLET BY MOUTH TWICE  DAILY AS NEEDED 180 tablet 2  . metoprolol  tartrate (LOPRESSOR ) 50 MG tablet TAKE 1 TABLET BY MOUTH TWICE  DAILY 200 tablet 3  . pantoprazole  (PROTONIX ) 40 MG tablet Take 1 tablet (40 mg total) by mouth daily. 90 tablet 3  . potassium chloride  (KLOR-CON  M) 10 MEQ tablet TAKE 1 TABLET BY MOUTH DAILY 100 tablet 2  . rosuvastatin  (CRESTOR ) 20 MG tablet Take 1 tablet (20 mg total) by mouth daily. 90 tablet 0  . thiamine  (VITAMIN B-1) 100 MG tablet Take 100 mg by mouth every other day.    . vitamin E 180 MG (400 UNITS) capsule Take 400 Units by mouth daily.     No facility-administered medications prior to visit.    Allergies  Allergen Reactions  . Tramadol  Other (See Comments)    Dizziness     Review of Systems  Constitutional:  Positive for malaise/fatigue. Negative for fever.  HENT:  Positive for congestion.   Eyes:  Negative for blurred vision.  Respiratory:  Positive for cough. Negative for shortness of breath.   Cardiovascular:  Negative for chest pain, palpitations and leg swelling.  Gastrointestinal:  Positive for heartburn. Negative for abdominal pain, blood in stool and nausea.  Genitourinary:  Positive for frequency. Negative for dysuria and hematuria.  Musculoskeletal:  Negative for falls.  Skin:  Negative for rash.  Neurological:  Negative for dizziness, loss of consciousness and headaches.  Endo/Heme/Allergies:  Negative for environmental allergies.  Psychiatric/Behavioral:  Negative for depression. The patient is not nervous/anxious.        Objective:    Physical Exam Constitutional:      General: She is not in acute distress.    Appearance: Normal appearance. She is well-developed. She is not toxic-appearing.  HENT:     Head: Normocephalic and atraumatic.     Right Ear: External ear normal.     Left Ear: External ear normal.     Nose: Nose  normal.  Eyes:     General:        Right eye: No discharge.        Left eye: No discharge.     Conjunctiva/sclera: Conjunctivae normal.  Neck:     Thyroid : No thyromegaly.  Cardiovascular:     Rate and Rhythm: Normal rate and regular rhythm.     Heart sounds: Normal heart sounds. No murmur heard. Pulmonary:     Effort: Pulmonary effort is normal. No respiratory distress.     Breath sounds: Normal breath sounds.  Abdominal:     General: Bowel sounds are normal.     Palpations: Abdomen is soft.     Tenderness: There is no abdominal tenderness. There is no guarding.  Musculoskeletal:        General: Normal range of motion.     Cervical back: Neck supple.  Lymphadenopathy:     Cervical: No cervical adenopathy.  Skin:    General: Skin is warm and dry.  Neurological:     Mental Status: She is alert and oriented to person, place, and time.  Psychiatric:        Mood and Affect: Mood normal.        Behavior: Behavior normal.        Thought Content: Thought content normal.        Judgment: Judgment normal.     BP 124/70   Pulse 60   Temp 98.1 F (  36.7 C)   Resp 16   Ht 5' 1 (1.549 m)   Wt 167 lb 12.8 oz (76.1 kg)   LMP 02/07/1992   SpO2 98%   BMI 31.71 kg/m  Wt Readings from Last 3 Encounters:  11/05/23 167 lb 12.8 oz (76.1 kg)  10/23/23 165 lb (74.8 kg)  10/11/23 167 lb (75.8 kg)    Diabetic Foot Exam - Simple   Simple Foot Form Diabetic Foot exam was performed with the following findings: Yes 11/05/2023 10:57 AM  Visual Inspection No deformities, no ulcerations, no other skin breakdown bilaterally: Yes Sensation Testing See comments: Yes Pulse Check Posterior Tibialis and Dorsalis pulse intact bilaterally: Yes Comments Right foot no decreased sensation, left foot decreased sensation toes and plantar surface    Lab Results  Component Value Date   WBC 8.5 10/24/2023   HGB 12.7 10/24/2023   HCT 37.7 10/24/2023   PLT 198.0 10/24/2023   GLUCOSE 115 (H)  10/24/2023   CHOL 182 10/24/2023   TRIG 118.0 10/24/2023   HDL 64.20 10/24/2023   LDLDIRECT 95.0 03/25/2020   LDLCALC 94 10/24/2023   ALT 22 10/24/2023   AST 21 10/24/2023   NA 134 (L) 10/24/2023   K 4.2 10/24/2023   CL 100 10/24/2023   CREATININE 0.77 10/24/2023   BUN 27 (H) 10/24/2023   CO2 25 10/24/2023   TSH 1.96 10/24/2023   INR 0.88 01/16/2011   HGBA1C 7.1 (H) 10/24/2023   MICROALBUR 2.7 (H) 04/23/2023    Lab Results  Component Value Date   TSH 1.96 10/24/2023   Lab Results  Component Value Date   WBC 8.5 10/24/2023   HGB 12.7 10/24/2023   HCT 37.7 10/24/2023   MCV 83.3 10/24/2023   PLT 198.0 10/24/2023   Lab Results  Component Value Date   NA 134 (L) 10/24/2023   K 4.2 10/24/2023   CHLORIDE 101 07/08/2015   CO2 25 10/24/2023   GLUCOSE 115 (H) 10/24/2023   BUN 27 (H) 10/24/2023   CREATININE 0.77 10/24/2023   BILITOT 0.6 10/24/2023   ALKPHOS 54 10/24/2023   AST 21 10/24/2023   ALT 22 10/24/2023   PROT 6.7 10/24/2023   ALBUMIN 4.5 10/24/2023   CALCIUM  9.6 10/24/2023   ANIONGAP 14 10/11/2023   EGFR 72 07/19/2023   GFR 71.77 10/24/2023   Lab Results  Component Value Date   CHOL 182 10/24/2023   Lab Results  Component Value Date   HDL 64.20 10/24/2023   Lab Results  Component Value Date   LDLCALC 94 10/24/2023   Lab Results  Component Value Date   TRIG 118.0 10/24/2023   Lab Results  Component Value Date   CHOLHDL 3 10/24/2023   Lab Results  Component Value Date   HGBA1C 7.1 (H) 10/24/2023       Assessment & Plan:  Diabetic peripheral neuropathy (HCC) Assessment & Plan: Stable but persistent continue to control glucose levels   Essential hypertension Assessment & Plan: Well controlled, no changes to meds. Encouraged heart healthy diet such as the DASH diet and exercise as tolerated.    Orders: -     Comprehensive metabolic panel with GFR; Future -     CBC with Differential/Platelet; Future -     TSH;  Future  HYPERCHOLESTEROLEMIA Assessment & Plan: Encourage heart healthy diet such as MIND or DASH diet, increase exercise, avoid trans fats, simple carbohydrates and processed foods, consider a krill or fish or flaxseed oil cap daily. Tolerating Rosuvastatin   Orders: -  Lipid panel; Future  Renal insufficiency Assessment & Plan: Hydrate and monitor    Type 2 diabetes mellitus with obesity Assessment & Plan: hgba1c acceptable, minimize simple carbs. Increase exercise as tolerated. Continue current meds  Orders: -     Accu-Chek Softclix Lancets; Use as instructed  Dispense: 100 each; Refill: 4 -     Hemoglobin A1c; Future -     Microalbumin / creatinine urine ratio; Future  Thiamine  deficiency Assessment & Plan: Supplement and monitor   Orders: -     Vitamin B1; Future  Vitamin D  deficiency Assessment & Plan: Supplement and monitor    Urinary frequency -     Urinalysis, Routine w reflex microscopic; Future -     Urine Culture; Future  Other orders -     Famotidine ; Take 1 tablet (40 mg total) by mouth at bedtime as needed for heartburn or indigestion.  Dispense: 90 tablet; Refill: 1    Assessment and Plan Assessment & Plan Type 2 Diabetes Mellitus A1c is 7, indicating suboptimal control. Reports occasional hypoglycemia, particularly between breakfast and lunch, and nocturnally. - Continue metformin  500 mg oral twice daily. - Monitor blood glucose levels regularly. - Ensure adequate hydration and avoid excessive carbohydrate intake. - Consume protein with breakfast and a protein-rich bedtime snack to prevent nocturnal hypoglycemia. - Re-evaluate blood glucose levels in 3-4 months, post-holidays.  Diabetic peripheral neuropathy (HCC) Reports numbness and heaviness in feet, attributed to diabetes. Neuropathy exacerbated by diabetes due to reduced oxygen and nutrition reaching the feet. - Perform foot exam to assess for neuropathy and other issues. - Encourage  use of compression socks, recommend Bombas brand for easier use. - Advise elevating feet above the heart for 10-15 minutes, two to three times a day. - Encourage controlling blood pressure, staying hydrated, exercising, and avoiding excessive salt intake.  Bilateral lower extremity edema Reports leg swelling, possibly due to vascular aging, heart issues, or kidney problems. Discussed vascular system anatomy and aging effects on vein valves, leading to varicose veins and swelling. - Encourage use of compression socks, specifically Bombas brand, to help with swelling. - Advise elevating feet above the heart for 10-15 minutes, two to three times a day. - Control blood pressure and minimize salt intake. - Stay hydrated and exercise regularly. - Consider echocardiogram to assess heart function. - Consider referral to cardiology for a stress test if symptoms persist.  Chronic Kidney Disease Chronic kidney disease is well-managed with GFR above 60 and creatinine level at 0.77. BUN slightly elevated, indicating mild kidney function abnormality. - Encourage hydration by drinking water every hour. - Avoid excessive intake of processed foods. - Avoid over-the-counter medications without consulting provider, especially NSAIDs like Aleve or Advil . - Continue metformin  and losartan  as she is kidney protective. - Monitor kidney function with blood work at each visit.  Essential hypertension Emphasized importance of controlling blood pressure to manage overall health, including kidney function and lower extremity edema. - Continue losartan  50 mg oral twice daily. - Monitor blood pressure regularly. - Encourage lifestyle modifications, including exercise and a low-salt diet.  Iron  deficiency anemia Reports fatigue and has been receiving iron  infusions. Iron  levels stabilized at low normal. Discussed possibility of additional iron  infusion to improve levels and address fatigue. - Consider a third iron   infusion to boost iron  levels and address fatigue. - Start a multivitamin with minerals and discontinue vitamin E. - Monitor iron  levels and symptoms of fatigue.  Gastroesophageal Reflux Disease (GERD) Experiences heartburn and chronic cough, possibly  related to acid reflux. Currently taking pantoprazole  and occasionally Nexium . - Continue pantoprazole  40 mg oral daily, alternating with Nexium  as needed. - Prescribe famotidine  40 mg oral at bedtime as needed to help with cough and heartburn. - Send a 90-day prescription for famotidine  to [facility].  Breast Cancer Not on any medication for breast cancer due to significant side effects from previous treatments. Prioritizing quality of life over small change in cancer risk reduction.  Recording duration: 39 minutes     Harlene Horton, MD

## 2023-11-05 NOTE — Patient Instructions (Addendum)
 Consider letting us  do an Echocardiogram on your heart and/or call cardiology and proceed with stress test  Start a multivitamin with minerals and drop Vit E  CBTinsomnia APP by the [employer]   Bombas socks make compression socks    Edema  Edema is an abnormal buildup of fluids in the body tissues and under the skin. Swelling of the legs, feet, and ankles is a common symptom that becomes more likely as you get older. Swelling is also common in looser tissues, such as around the eyes. Pressing on the area may make a temporary dent in your skin (pitting edema). This fluid may also accumulate in your lungs (pulmonary edema). There are many possible causes of edema. Eating too much salt (sodium) and being on your feet or sitting for a long time can cause edema in your legs, feet, and ankles. Common causes of edema include: Certain medical conditions, such as heart failure, liver or kidney disease, and cancer. Weak leg blood vessels. An injury. Pregnancy. Medicines. Being obese. Low protein levels in the blood. Hot weather may make edema worse. Edema is usually painless. Your skin may look swollen or shiny. Follow these instructions at home: Medicines Take over-the-counter and prescription medicines only as told by your health care provider. Your health care provider may prescribe a medicine to help your body get rid of extra water (diuretic). Take this medicine if you are told to take it. Eating and drinking Eat a low-salt (low-sodium) diet to reduce fluid as told by your health care provider. Sometimes, eating less salt may reduce swelling. Depending on the cause of your swelling, you may need to limit how much fluid you drink (fluid restriction). General instructions Raise (elevate) the injured area above the level of your heart while you are sitting or lying down. Do not sit still or stand for long periods of time. Do not wear tight clothing. Do not wear garters on your  upper legs. Exercise your legs to get your circulation going. This helps to move the fluid back into your blood vessels, and it may help the swelling go down. Wear compression stockings as told by your health care provider. These stockings help to prevent blood clots and reduce swelling in your legs. It is important that these are the correct size. These stockings should be prescribed by your health care provider to prevent possible injuries. If elastic bandages or wraps are recommended, use them as told by your health care provider. Contact a health care provider if: Your edema does not get better with treatment. You have heart, liver, or kidney disease and have symptoms of edema. You have sudden and unexplained weight gain. Get help right away if: You develop shortness of breath or chest pain. You cannot breathe when you lie down. You develop pain, redness, or warmth in the swollen areas. You have heart, liver, or kidney disease and suddenly get edema. You have a fever and your symptoms suddenly get worse. These symptoms may be an emergency. Get help right away. Call 911. Do not wait to see if the symptoms will go away. Do not drive yourself to the hospital. Summary Edema is an abnormal buildup of fluids in the body tissues and under the skin. Eating too much salt (sodium)and being on your feet or sitting for a long time can cause edema in your legs, feet, and ankles. Raise (elevate) the injured area above the level of your heart while you are sitting or lying down. Follow your health care  provider's instructions about diet and how much fluid you can drink. This information is not intended to replace advice given to you by your health care provider. Make sure you discuss any questions you have with your health care provider. Document Revised: 09/27/2020 Document Reviewed: 09/27/2020 Elsevier Patient Education  2024 [employer].

## 2023-11-06 ENCOUNTER — Telehealth: Payer: Self-pay

## 2023-11-06 DIAGNOSIS — E119 Type 2 diabetes mellitus without complications: Secondary | ICD-10-CM

## 2023-11-06 MED ORDER — ACCU-CHEK GUIDE TEST VI STRP: ORAL_STRIP | 12 refills | Status: AC

## 2023-11-06 NOTE — Telephone Encounter (Signed)
 [facility] needing directions for test strips. Rx resent

## 2023-11-06 NOTE — Telephone Encounter (Signed)
 Copied from CRM #[id number]. Topic: Clinical - Medication Question >> Nov 06, 2023 10:54 AM Viola FALCON wrote: [facility] called to clarify the testing frequency for the Accu-Chek Softclix Lancets lancets that was sent to them yesterday. Please call back [phone] - Reference#[id number]

## 2023-11-12 ENCOUNTER — Telehealth: Payer: Self-pay

## 2023-11-12 DIAGNOSIS — E669 Obesity, unspecified: Secondary | ICD-10-CM

## 2023-11-12 MED ORDER — ACCU-CHEK SOFTCLIX LANCETS MISC: 4 refills | Status: AC

## 2023-11-12 NOTE — Telephone Encounter (Signed)
 Rx updated.

## 2023-11-12 NOTE — Telephone Encounter (Signed)
 Copied from CRM #[id number]. Topic: Clinical - Prescription Issue >> Nov 12, 2023 10:27 AM Corin V wrote: Reason for CRM: Patient stated she has spoken to [facility] and they are needing a new script for the Lancets that specifies the frequency of use per day. Please send the script for a 90 day supply. She is currently out of the lancets due to the script not containing the frequency.

## 2023-11-19 ENCOUNTER — Other Ambulatory Visit: Payer: Self-pay | Admitting: Family Medicine

## 2023-11-19 ENCOUNTER — Ambulatory Visit: Payer: Self-pay | Admitting: Family Medicine

## 2023-11-19 NOTE — Telephone Encounter (Signed)
 FYI Only or Action Required?: Action required by provider: update on patient condition.  Patient was last seen in primary care on 11/05/2023 by Domenica Harlene LABOR, MD.  Called Nurse Triage reporting Knee Pain.  Symptoms began several days ago.  Triage Disposition: Call EMS 911 Now  Patient/caregiver understands and will follow disposition?: No, wishes to speak with PCP       Copied from CRM [id number]. Topic: Clinical - Red Word Triage >> Nov 19, 2023 12:13 PM [name] wrote: Red Word that prompted transfer to Nurse Triage: Patient states she has pretty bad pain in her knee, and both legs are numb Reason for Disposition  [1] Numbness (i.e., loss of sensation) of the face, arm / hand, or leg / foot on one side of the body AND [2] sudden onset AND [3] present now  Answer Assessment - Initial Assessment Questions This RN recommends pt goes to ED. This RN offered to call pt an ambulance but pt refused. Pt also refuses going to ED and wants to schedule an appointment with PCP in office. This RN notified CAL of pt refusal of ED disposition. Pt call back number is [phone]  Bilateral knee pain started one week ago, R knee hurts worse; 10/10 when started and now an 8/10 pain level Both legs are numb for 3 days, R leg is worse; burning sensation; pt states she is a diabetic Some swelling in right knee Denies rash, fever, chest pain, difficulty breathing Pt state she can walk  Protocols used: Knee Pain-A-AH, Neurologic Deficit-A-AH

## 2023-11-20 ENCOUNTER — Ambulatory Visit: Admitting: Family

## 2023-11-20 ENCOUNTER — Other Ambulatory Visit (HOSPITAL_BASED_OUTPATIENT_CLINIC_OR_DEPARTMENT_OTHER): Payer: Self-pay

## 2023-11-20 VITALS — BP 131/45 | HR 62 | Temp 97.8°F | Resp 16 | Ht 61.0 in | Wt 167.0 lb

## 2023-11-20 DIAGNOSIS — M25561 Pain in right knee: Secondary | ICD-10-CM

## 2023-11-20 DIAGNOSIS — R29898 Other symptoms and signs involving the musculoskeletal system: Secondary | ICD-10-CM

## 2023-11-20 DIAGNOSIS — R200 Anesthesia of skin: Secondary | ICD-10-CM

## 2023-11-20 DIAGNOSIS — G629 Polyneuropathy, unspecified: Secondary | ICD-10-CM

## 2023-11-20 MED ORDER — DICLOFENAC SODIUM 1 % EX GEL: 4.0000 g | Freq: Two times a day (BID) | CUTANEOUS | Status: AC

## 2023-11-20 NOTE — Patient Instructions (Signed)
 VISIT SUMMARY:  Today, we discussed your knee pain and numbness in your legs. We reviewed your history of diabetes, hypertension, and anemia. We have planned some tests and treatments to help manage your symptoms and improve your health.  YOUR PLAN:  LEFT KNEE PAIN AND SWELLING: You have chronic pain and swelling in your left knee, likely due to osteoarthritis. We also considered gout due to your family history. -We will order a uric acid test to check for gout. -Apply Voltaren  gel to your knee twice daily. -Take Tylenol  for pain relief. -If there is no improvement in a week, please let us  know and we will refer you to a sports medicine specialist for a knee injection.  LEFT LOWER EXTREMITY PAIN, NUMBNESS, AND WEAKNESS: Your symptoms in your left leg are likely due to lumbar radiculopathy, possibly related to mild lumbar spondylosis. -We will recommend an MRI of your lower back, pending insurance approval. -Monitor your symptoms and let us  know if the numbness persists.  HYPERTENSION: Your hypertension is managed with three medications and your blood pressure is well-controlled today.  ANEMIA: You have anemia with previously low B12 levels. -We will add a B1 test to today's lab work.

## 2023-11-20 NOTE — Progress Notes (Signed)
 1  Subjective:     [name], female    DOB: 1941/02/05, 83 y.o.   MRN: [id number]  Chief Complaint  Patient presents with   Knee Pain    Patient complains of right knee pain for one week   Numbness    Patient complains of numbness on both legs    Knee Pain     Discussed the use of AI scribe software for clinical note transcription with the patient, who gave verbal consent to proceed.  History of Present Illness  [name] is an 83 year old female with diabetes and mild lumbar spondylosis who presents with knee pain and numbness in the legs.  She experiences severe knee pain and numbness in her legs, which began suddenly after dinner with her family. The pain and numbness were initially so severe that she was unable to walk for two days. The numbness affects her entire leg, and the pain is located in the knee, radiating down the leg. The pain is present when standing, and her knee was swollen, though the swelling has decreased somewhat.  She has a history of sciatica and had an x-ray of her lower back in 2022. She has experienced low back pain in the past but not currently. She also has a history of a mini-stroke in 2014, which left her with some weakness and occasional numbness on one side. No recent travel and has not seen an orthopedic doctor for her knee recently.  She is diabetic and is concerned about the numbness being related to her diabetes. No new issues with urinary or bowel incontinence. Her ankles are more swollen than usual but not red. No unusual weakness in her legs aside from the pain, although she finds it difficult to climb stairs.  Her current medications include treatments for diabetes and blood pressure, and she takes three different medications for hypertension. She state she cannot take ibuprofen  due to kidney concerns. She is also anemic and has been advised to take B1, although she is unsure if she needs it daily.     Health  Maintenance Due  Topic Date Due   Zoster Vaccines- Shingrix (1 of 2) Never done   DTaP/Tdap/Td (2 - Tdap) 03/03/2015    Past Medical History:  Diagnosis Date   Abdominal pain in female 03/18/2010   Qualifier: Diagnosis of  By: Avram MD, NOLIA Pitts E    Anemia 06/08/2014   Anxiety    Arthritis    Spinal Osteoarthritis   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 09/05/2006   Qualifier: Diagnosis of  By: Wilhemina RMA, Lucy     Diabetic peripheral neuropathy (HCC) 10/29/2013   Encounter for preventative adult health care exam with abnormal findings 09/14/2013   Esophageal reflux    Gastric polyp    Fundic Gland   Gastroparesis    Headache(784.0)    Heart murmur    Echocardiogram 2/11: EF 60-65%, mild LAE, grade 1 diastolic dysfunction, aortic valve sclerosis, mean gradient 9 mm of mercury, PASP 34   Hematuria 03/16/2016   Iron  deficiency anemia, unspecified    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   Leg swelling    bilateral   Neck pain 04/22/2015   PONV (postoperative  nausea and vomiting)    pt states body temperature drops every time she has anesthesia; pt states only needs small amount of anesthesia   PSVT (paroxysmal supraventricular tachycardia)    Pure hypercholesterolemia    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   Renal insufficiency 06/18/2019   pt states  L kidney function very low-functions at about 20%   Stroke [facility])    tia, 2014   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   Type II or unspecified type diabetes mellitus without mention of complication, not stated as uncontrolled    Unspecified essential hypertension    Unspecified hereditary and idiopathic peripheral neuropathy 10/29/2013   Vitamin D  deficiency     Past Surgical History:  Procedure Laterality Date   BREAST BIOPSY Right 06/13/2022   US  RT  BREAST BX W LOC DEV 1ST LESION IMG BX SPEC US  GUIDE 06/13/2022 [facility]   BREAST LUMPECTOMY Right 07/31/2022   Procedure: RIGHT BREAST LUMPECTOMY;  Surgeon: Vernetta Berg, MD;  Location: [facility];  Service: General;  Laterality: Right;   CHOLECYSTECTOMY  1993   COLONOSCOPY  11/11/2010   diverticulosis   DILATATION & CURRETTAGE/HYSTEROSCOPY WITH RESECTOCOPE N/A 02/25/2013   Procedure: Attempted hysteroscopy with uterine perforation;  Surgeon: Bobie FORBES Crown de Charlynn FORBES Cary, MD;  Location: [facility];  Service: Gynecology;  Laterality: N/A;   ESOPHAGOGASTRODUODENOSCOPY  08/29/2010; 09/15/2010   Carcinoid tumor less than 1 cm in July 2012 not seen in August 2012 , gastritis, fundic gland polyps   ESOPHAGOGASTRODUODENOSCOPY  05/16/2011   ESOPHAGOGASTRODUODENOSCOPY  06/14/2012   EUS  12/15/2010   Procedure: UPPER ENDOSCOPIC ULTRASOUND (EUS) LINEAR;  Surgeon: Toribio Cedar, MD;  Location: [facility];  Service: Endoscopy;  Laterality: N/A;   EYE SURGERY Bilateral    Bi lateral cateracts and bi lateral laser   LAPAROSCOPY N/A 02/25/2013   Procedure: Cystoscopy and laparoscopy with fulguration of uterine serosa;  Surgeon: Bobie FORBES Crown de Charlynn FORBES Cary, MD;  Location: [facility];  Service: Gynecology;  Laterality: N/A;   TONSILLECTOMY      Family History  Problem Relation Age of Onset   Diabetes Mother    Stroke Father        deceased age 36   Heart disease Sister        deceased MI age 56   Diabetes Sister    Heart disease Sister    Hypertension Sister    Hyperlipidemia Sister    Diabetes Sister    Heart disease Sister    Hypertension Sister    Hyperlipidemia Sister    Heart disease Brother        deceased MI age 53   Intellectual disability Brother    Diabetes Brother    Heart disease Brother    Hypertension Brother    Hyperlipidemia Brother    Diabetes Maternal Grandmother    Hypertension Paternal Grandmother    Colon cancer Neg Hx    Esophageal cancer  Neg Hx    Stomach cancer Neg Hx    Rectal cancer Neg Hx     Social History   Socioeconomic History   Marital status: Widowed    Spouse name: Not on file   Number of children: 0   Years of education: college   Highest education level: Not on file  Occupational History   Occupation: retired  Tobacco Use   Smoking status: Never   Smokeless tobacco: Never   Tobacco comments:    Never used tobacco  Vaping Use   Vaping status: Never  Used  Substance and Sexual Activity   Alcohol  use: Yes    Comment: ocassional glass of wine   Drug use: No   Sexual activity: Not Currently    Partners: Male    Birth control/protection: Post-menopausal    Comment: lives alone, no dietary restrictions except avoid fresh veg, fruit, whole grains  Other Topics Concern   Not on file  Social History Narrative   Patient was married ([name]) - widow   Patient does not have any children.   Patient is right-handed.   Patient has a BA degree.   One caffeine drink daily    Social Drivers of Corporate investment banker Strain: Low Risk  (10/23/2023)   Overall Financial Resource Strain (CARDIA)    Difficulty of Paying Living Expenses: Not very hard  Food Insecurity: No Food Insecurity (10/23/2023)   Hunger Vital Sign    Worried About Running Out of Food in the Last Year: Never true    Ran Out of Food in the Last Year: Never true  Transportation Needs: No Transportation Needs (10/23/2023)   PRAPARE - Administrator, Civil Service (Medical): No    Lack of Transportation (Non-Medical): No  Physical Activity: Insufficiently Active (10/23/2023)   Exercise Vital Sign    Days of Exercise per Week: 7 days    Minutes of Exercise per Session: 20 min  Stress: No Stress Concern Present (10/23/2023)   [employer] of Occupational Health - Occupational Stress Questionnaire    Feeling of Stress: Not at all  Social Connections: Moderately Isolated (10/23/2023)   Social Connection and Isolation Panel     Frequency of Communication with Friends and Family: More than three times a week    Frequency of Social Gatherings with Friends and Family: Once a week    Attends Religious Services: More than 4 times per year    Active Member of [employer] or Organizations: No    Attends Banker Meetings: Never    Marital Status: Widowed  Intimate Partner Violence: Not At Risk (10/23/2023)   Humiliation, Afraid, Rape, and Kick questionnaire    Fear of Current or Ex-Partner: No    Emotionally Abused: No    Physically Abused: No    Sexually Abused: No    Outpatient Medications Prior to Visit  Medication Sig Dispense Refill   Accu-Chek Softclix Lancets lancets Check blood sugars 1-2 times daily 200 each 4   acetaminophen  (TYLENOL ) 325 MG tablet Take 2 tablets (650 mg total) by mouth every 6 (six) hours as needed for up to 30 doses for mild pain or moderate pain. 30 tablet 0   amLODipine  (NORVASC ) 5 MG tablet TAKE 1 TABLET BY MOUTH DAILY 100 tablet 2   aspirin  EC 81 MG tablet Take 81 mg by mouth daily. Swallow whole.     Blood Glucose Monitoring Suppl (ACCU-CHEK GUIDE ME) w/Device KIT CHECK BLOOD SUGAR 3 TIMES DAILY OR AS NEEDED 1 kit 0   calcium -vitamin D  (OSCAL WITH D) 500-5 MG-MCG tablet Take 1 tablet by mouth daily.     cholecalciferol  (VITAMIN D ) 1000 UNITS tablet Take 1,000 Units by mouth daily.     dicyclomine  (BENTYL ) 10 MG capsule TAKE 1 CAPSULE BY MOUTH AS  NEEDED EVERY 12 HOURS 60 capsule 0   famotidine  (PEPCID ) 40 MG tablet Take 1 tablet (40 mg total) by mouth at bedtime as needed for heartburn or indigestion. 90 tablet 1   furosemide  (LASIX ) 20 MG tablet TAKE 1 TABLET BY MOUTH  DAILY AS  NEEDED 100 tablet 2   glucose blood (ACCU-CHEK GUIDE TEST) test strip Check blood sugars 1-2 times daily 200 each 12   losartan  (COZAAR ) 50 MG tablet TAKE 1 TABLET BY MOUTH TWICE  DAILY 200 tablet 2   metFORMIN  (GLUCOPHAGE -XR) 500 MG 24 hr tablet TAKE 1 TABLET BY MOUTH IN THE  MORNING AND 1 TABLET BY MOUTH  AT BEDTIME (Patient taking differently: Twice a day before meals) 200 tablet 2   metoCLOPramide  (REGLAN ) 5 MG tablet TAKE 1 TABLET BY MOUTH TWICE  DAILY AS NEEDED 180 tablet 2   metoprolol  tartrate (LOPRESSOR ) 50 MG tablet TAKE 1 TABLET BY MOUTH TWICE  DAILY 200 tablet 3   pantoprazole  (PROTONIX ) 40 MG tablet Take 1 tablet (40 mg total) by mouth daily. 90 tablet 3   potassium chloride  (KLOR-CON  M) 10 MEQ tablet TAKE 1 TABLET BY MOUTH DAILY 100 tablet 2   rosuvastatin  (CRESTOR ) 20 MG tablet Take 1 tablet (20 mg total) by mouth daily. 90 tablet 0   thiamine  (VITAMIN B-1) 100 MG tablet Take 100 mg by mouth every other day.     vitamin E 180 MG (400 UNITS) capsule Take 400 Units by mouth daily.     No facility-administered medications prior to visit.    Allergies  Allergen Reactions   Tramadol  Other (See Comments)    Dizziness     ROS See HPI    Objective:    Physical Exam Constitutional:      General: She is not in acute distress.    Appearance: Normal appearance. She is well-developed.  HENT:     Head: Normocephalic and atraumatic.     Right Ear: External ear normal.     Left Ear: External ear normal.  Eyes:     General: No scleral icterus. Neck:     Thyroid : No thyromegaly.  Cardiovascular:     Rate and Rhythm: Normal rate and regular rhythm.     Heart sounds: Normal heart sounds. No murmur heard. Pulmonary:     Effort: Pulmonary effort is normal. No respiratory distress.     Breath sounds: Normal breath sounds. No wheezing.  Musculoskeletal:     Cervical back: Neck supple.     Comments: Right knee swelling, no redness  Bilateral LE edema 3+  Skin:    General: Skin is warm and dry.  Neurological:     Mental Status: She is alert and oriented to person, place, and time.     Comments: Bilateral leg sensation intact to monofilament  RLE strength 5/5 LLE strength 4/5  Psychiatric:        Mood and Affect: Mood normal.        Behavior: Behavior normal.        Thought  Content: Thought content normal.        Judgment: Judgment normal.      BP (!) 131/45 (BP Location: Left Arm, Patient Position: Sitting, Cuff Size: Normal)   Pulse 62   Temp 97.8 F (36.6 C) (Oral)   Resp 16   Ht 5' 1 (1.549 m)   Wt 167 lb (75.8 kg)   LMP 02/07/1992   SpO2 100%   BMI 31.55 kg/m  Wt Readings from Last 3 Encounters:  11/20/23 167 lb (75.8 kg)  11/05/23 167 lb 12.8 oz (76.1 kg)  10/23/23 165 lb (74.8 kg)       Assessment & Plan:   Problem List Items Addressed This Visit       Unprioritized  Acute pain of right knee - Primary    left knee pain and swelling, likely osteoarthritis. Differential includes gout due to family history. - Order uric acid test. - Recommend topical Voltaren  gel, apply twice daily. - Advise Tylenol  for pain. - Consider sports medicine referral for knee injection if no improvement in a week.       Relevant Medications   diclofenac  Sodium (VOLTAREN ) 1 % GEL   Other Relevant Orders   Uric acid   Other Visit Diagnoses       Neuropathy       Relevant Orders   Vitamin B1     Bilateral leg numbness       Relevant Orders   MR Lumbar Spine Wo Contrast     Left leg weakness       Relevant Orders   MR Lumbar Spine Wo Contrast       I am having [name] start on diclofenac  Sodium. I am also having her maintain her cholecalciferol , acetaminophen , aspirin  EC, thiamine , vitamin E, calcium -vitamin D , metoprolol  tartrate, pantoprazole , amLODipine , metoCLOPramide , metFORMIN , losartan , potassium chloride , Accu-Chek Guide Me, rosuvastatin , dicyclomine , famotidine , Accu-Chek Guide Test, Accu-Chek Softclix Lancets, and furosemide .  Meds ordered this encounter  Medications   diclofenac  Sodium (VOLTAREN ) 1 % GEL    Sig: Apply 4 g topically in the morning and at bedtime. As needed for right knee pain    Supervising Provider:   DOMENICA BLACKBIRD A [[id number]]

## 2023-11-20 NOTE — Assessment & Plan Note (Signed)
 left knee pain and swelling, likely osteoarthritis. Differential includes gout due to family history. - Order uric acid test. - Recommend topical Voltaren  gel, apply twice daily. - Advise Tylenol  for pain. - Consider sports medicine referral for knee injection if no improvement in a week.

## 2023-11-21 LAB — URIC ACID: Uric Acid, Serum: 3.6 mg/dL (ref 2.4–7.0)

## 2023-11-22 ENCOUNTER — Ambulatory Visit: Payer: Self-pay | Admitting: Family

## 2023-11-23 ENCOUNTER — Ambulatory Visit: Payer: Self-pay | Admitting: Family Medicine

## 2023-11-23 NOTE — Telephone Encounter (Signed)
 FYI Only or Action Required?: Action required by provider: referral request and lab or test result follow-up needed.  Patient was last seen in primary care on 11/20/2023 by Daryl Setter, NP.  Called Nurse Triage reporting Results.  Symptoms began N/A.  Interventions attempted: Other: N/A.  Symptoms are: knee pain.  Triage Disposition: Call PCP Within 24 Hours  Patient/caregiver understands and will follow disposition?: Yes                 Copied from CRM #[id number]. Topic: Clinical - Lab/Test Results >> Nov 23, 2023  2:49 PM Alfonso HERO wrote: Reason for CRM: patient wants a callback for results of D1 and B12. Can you please call and let her know. She also wants to be referred out to someone about her knee pain. Reason for Disposition  Caller requesting lab results  (Exception: Routine or non-urgent lab result.)  Answer Assessment - Initial Assessment Questions 1. REASON FOR CALL or QUESTION: What is your reason for calling today? or How can I best    Patient calling in for lab results. Read provider Melissa's note to patient: Gout test is normal.  I think your knee pain is likely due to arthritis rather than gout.   She is also asking about her vitamin B1 and B12 results. No results in. Patient is requesting call back from office when results are in. She asked about the referral for knee pain. Per provider's note: Consider sports medicine referral for knee injection if no improvement in a week. Read that note off to patient. She states she is still having knee pain.  2. CALLER: Document the source of call. (e.g., laboratory staff, caregiver or patient).     Patient.  Protocols used: PCP Call - No Triage-A-AH

## 2023-11-23 NOTE — Telephone Encounter (Signed)
 Noted

## 2023-11-23 NOTE — Telephone Encounter (Signed)
 This RN made first attempt to contact patient with no answer. A voicemail was left with call back number provided.     Copied from CRM [id number]. Topic: Clinical - Lab/Test Results >> Nov 23, 2023  2:49 PM Alfonso HERO wrote: Reason for CRM: patient wants a callback for results of D1 and B12. Can you please call and let her know. She also wants to be referred out to someone about her knee pain.

## 2023-11-26 LAB — VITAMIN B1: Vitamin B1 (Thiamine): 13 nmol/L (ref 8–30)

## 2023-11-27 ENCOUNTER — Ambulatory Visit: Payer: Self-pay

## 2023-11-27 ENCOUNTER — Ambulatory Visit

## 2023-11-27 ENCOUNTER — Ambulatory Visit (HOSPITAL_BASED_OUTPATIENT_CLINIC_OR_DEPARTMENT_OTHER)
Admission: RE | Admit: 2023-11-27 | Discharge: 2023-11-27 | Disposition: A | Discharge status: Home / Self Care | Source: Ambulatory Visit

## 2023-11-27 VITALS — BP 134/72 | Ht 61.0 in | Wt 167.0 lb

## 2023-11-27 DIAGNOSIS — M25561 Pain in right knee: Secondary | ICD-10-CM

## 2023-11-27 DIAGNOSIS — M1711 Unilateral primary osteoarthritis, right knee: Secondary | ICD-10-CM | POA: Insufficient documentation

## 2023-11-27 DIAGNOSIS — M25461 Effusion, right knee: Secondary | ICD-10-CM | POA: Diagnosis not present

## 2023-11-27 DIAGNOSIS — M170 Bilateral primary osteoarthritis of knee: Secondary | ICD-10-CM | POA: Diagnosis not present

## 2023-11-27 NOTE — Progress Notes (Addendum)
   Subjective:    [name], female    DOB: 83 y.o., [birth year]   MRN: [id number]  Chief Complaint: R knee pain  Discussed the use of AI scribe software for clinical note transcription with the patient, who gave verbal consent to proceed.  History of Present Illness [name] is an 83 year old female with diabetes and hypertension who presents with right knee pain. She was referred by Dr. Carleton for evaluation of suspected arthritis and gout.  Right knee pain and swelling - Onset two weeks ago, beginning suddenly when stepping off her bed - Initial pain was severe, preventing ambulation - Current pain persists, especially with walking or knee movement - Swelling was more pronounced two weeks ago; some swelling remains - Tightness and pain localized to the posterior aspect of the knee - Partial relief with topical Aspercreme and Voltaren  gel - No benefit from exercises seen on TV - Difficulty managing daily activities due to knee pain  Neuropathy and lower extremity numbness - Numbness in legs and feet attributed to diabetes  History of sciatica and lumbar degenerative changes - History of sciatica and degenerative changes in the back - Previously treated with physical therapy  Relevant medical comorbidities - Diabetes mellitus - Hypertension - 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  Small visible effusion. No erythema, deformity.  -Palpation: TTP - quad tendon, - patella, + patellar tendon, - tibial tuberosity, - pes bursa, - gerdy tubercle, + medial joint line, + lateral joint line, equivocal tenderness posterior knee, -  medial and lateral hamstrings.  Moderate crepitus with flexion/extension. -AROM/PROM: 5 degrees extension, 115 degrees flexion, low hamstring flexibility -Strength: Unable to tolerate single leg squat, 5/5 flexion, 5/5 extension - Negative anterior drawer.     Assessment & Plan:   Assessment & Plan Right knee pain and swelling, possible gout or osteoarthritis   Right knee pain and swelling have persisted for a couple of weeks. Initial suspicion of gout is due to family history and symptoms, but normal uric acid of 3.4 is reassuring (though studies have shown up to 14% of patients with confirmed gout may have uric acid levels below 6 during an acute flare). Differential diagnosis includes osteoarthritis, pseudogout, and gout. Pain has slightly improved with Voltaren  gel and Tylenol . Order x-rays of the right knee to assess for arthritis and rule out other causes. Consider an ultrasound at next visit to assess for effusion. Discuss potential steroid injection for inflammation based on x-ray +/- ultrasound findings. Educate on potential side effects of steroid injection, including temporary increase in blood sugar levels and risks of cartilage thinning with repeated use.  Type 2 diabetes mellitus with diabetic neuropathy   Type 2 diabetes with a recent A1c of 7.1. Diabetic neuropathy presents with numbness and discomfort in the feet. Blood sugar control is crucial, especially if considering a steroid injection for knee pain, as it may temporarily increase blood sugar levels. Monitor blood sugar levels, particularly if a steroid injection is administered. Continue the current diabetes management plan.

## 2023-11-28 ENCOUNTER — Other Ambulatory Visit: Payer: Self-pay

## 2023-11-28 ENCOUNTER — Ambulatory Visit

## 2023-11-28 VITALS — BP 134/70 | Ht 61.0 in | Wt 167.0 lb

## 2023-11-28 DIAGNOSIS — M25561 Pain in right knee: Secondary | ICD-10-CM

## 2023-11-28 DIAGNOSIS — M1711 Unilateral primary osteoarthritis, right knee: Secondary | ICD-10-CM

## 2023-11-28 DIAGNOSIS — M25461 Effusion, right knee: Secondary | ICD-10-CM

## 2023-11-28 MED ORDER — METHYLPREDNISOLONE ACETATE 40 MG/ML IJ SUSP
40.0000 mg | Freq: Once | INTRAMUSCULAR | Status: AC
  Administered 2023-11-28: 40 mg via INTRA_ARTICULAR

## 2023-11-28 NOTE — Progress Notes (Signed)
   Subjective:    [name], female    DOB: 83 y.o., 1940-10-16   MRN: [id number]  Chief Complaint: Right knee osteoarthritis  Discussed the use of AI scribe software for clinical note transcription with the patient, who gave verbal consent to proceed.  The patient is an 83 year old female with past medical history significant for TIA, SVT, hypertension, T2DM (last A1c 7.1), renal insufficiency presenting for follow-up on right knee osteoarthritis  History of Present Illness [name] is an 84 year old female with osteoarthritis who presents with knee pain.  Knee pain - Chronic bilateral knee pain due to osteoarthritis, right knee more symptomatic than left - Pain localized to the knee with occasional radiation to the back - Pain exacerbated by activity and relieved by rest - Previously trialed medications for osteoarthritis but discontinued due to adverse effects - Currently not taking any medication for osteoarthritis  Back and hip pain - Occasional back and hip pain attributed to osteoarthritis  Functional status - Prefers to remain active despite musculoskeletal pain      Objective:   There were no vitals filed for this visit.  Right knee: Visible effusion Tenderness to palpation along medial joint line  Knee Joint Synovial Fluid Aspiration with Ultrasound Guidance Ambera N Rinella 07/18/1940 Indications: Pain Procedure Details Following the description of risks including infection bleeding, damage to surrounding structures, patient provided written consent for right knee joint aspiration / injection with ultrasound guidance. Patient prepped with Chloraprep. Ethyl chloride for anesthesia. 3cc of Lidocaine  1% with 0.25cc of Sodium Bicarbonate 8.4% used in wheal then injected subcutaneous fashion with 22 gauge needle on superolateral approach. Under sterile conditions, 18 gauge needle used via lateral approach to aspirate 12 cc of serous  appearing fluid with some tinges of blood in it. Then 3cc of Mepivicaine 2% and 1 mL of Depo-Medrol  40 mg injected. Tolerated well, decreased pain, no complications.     Assessment & Plan:   Assessment & Plan Primary osteoarthritis of the right knee with effusion   Chronic primary osteoarthritis of the right knee is present, with cartilage thinning and bone spurs. X-rays indicate reduced joint space and possible effusion. Symptoms include pain and swelling, worsened by activity and relieved by rest. Perform knee aspiration and steroid injection in the right knee. Initiate physical therapy to strengthen surrounding muscles. Monitor blood sugar levels closely for 3-5 days post-injection. Consider gel injection if the steroid injection is effective and insurance permits.  Lab Results  Component Value Date   HGBA1C 7.1 (H) 10/24/2023

## 2023-12-03 ENCOUNTER — Other Ambulatory Visit: Payer: Self-pay | Admitting: Cardiology

## 2023-12-03 ENCOUNTER — Other Ambulatory Visit: Payer: Self-pay | Admitting: Family Medicine

## 2023-12-05 ENCOUNTER — Telehealth (HOSPITAL_BASED_OUTPATIENT_CLINIC_OR_DEPARTMENT_OTHER): Payer: Self-pay

## 2023-12-11 ENCOUNTER — Other Ambulatory Visit: Payer: Self-pay | Admitting: Gastroenterology

## 2023-12-11 ENCOUNTER — Other Ambulatory Visit: Payer: Self-pay | Admitting: Family Medicine

## 2023-12-11 DIAGNOSIS — E669 Obesity, unspecified: Secondary | ICD-10-CM

## 2023-12-17 ENCOUNTER — Other Ambulatory Visit: Payer: Self-pay | Admitting: Internal Medicine

## 2023-12-20 ENCOUNTER — Telehealth: Payer: Self-pay | Admitting: *Deleted

## 2023-12-20 DIAGNOSIS — M1711 Unilateral primary osteoarthritis, right knee: Secondary | ICD-10-CM

## 2023-12-20 NOTE — Telephone Encounter (Signed)
-----   Message from [name] sent at 12/20/2023  1:48 PM EST ----- Regarding: Patient wants to change PT provider to [facility] Pt called states she doesn't like place where we sent her for Physical therapy and wants to come to Op Rehab @ [facility].   Pls & Thx

## 2023-12-20 NOTE — Telephone Encounter (Signed)
 Referral placed. See referrals.

## 2023-12-21 ENCOUNTER — Ambulatory Visit: Admitting: Hematology & Oncology

## 2023-12-21 ENCOUNTER — Inpatient Hospital Stay: Attending: Hematology & Oncology

## 2023-12-21 ENCOUNTER — Inpatient Hospital Stay

## 2023-12-21 ENCOUNTER — Inpatient Hospital Stay: Admitting: Hematology & Oncology

## 2023-12-21 VITALS — BP 131/53 | HR 59 | Temp 98.6°F | Resp 18 | Wt 166.0 lb

## 2023-12-21 DIAGNOSIS — D509 Iron deficiency anemia, unspecified: Secondary | ICD-10-CM | POA: Diagnosis present

## 2023-12-21 DIAGNOSIS — Z79899 Other long term (current) drug therapy: Secondary | ICD-10-CM | POA: Diagnosis not present

## 2023-12-21 DIAGNOSIS — Z79811 Long term (current) use of aromatase inhibitors: Secondary | ICD-10-CM | POA: Diagnosis not present

## 2023-12-21 DIAGNOSIS — C50411 Malignant neoplasm of upper-outer quadrant of right female breast: Secondary | ICD-10-CM

## 2023-12-21 DIAGNOSIS — Z7982 Long term (current) use of aspirin: Secondary | ICD-10-CM | POA: Diagnosis not present

## 2023-12-21 DIAGNOSIS — Z170 Estrogen receptor positive status [ER+]: Secondary | ICD-10-CM | POA: Diagnosis not present

## 2023-12-21 DIAGNOSIS — Z1721 Progesterone receptor positive status: Secondary | ICD-10-CM | POA: Insufficient documentation

## 2023-12-21 DIAGNOSIS — Z7984 Long term (current) use of oral hypoglycemic drugs: Secondary | ICD-10-CM | POA: Insufficient documentation

## 2023-12-21 DIAGNOSIS — Z1732 Human epidermal growth factor receptor 2 negative status: Secondary | ICD-10-CM | POA: Diagnosis not present

## 2023-12-21 DIAGNOSIS — K909 Intestinal malabsorption, unspecified: Secondary | ICD-10-CM

## 2023-12-21 LAB — CBC WITH DIFFERENTIAL (CANCER CENTER ONLY)
Abs Immature Granulocytes: 0.03 K/uL (ref 0.00–0.07)
Basophils Absolute: 0 K/uL (ref 0.0–0.1)
Basophils Relative: 1 %
Eosinophils Absolute: 0.1 K/uL (ref 0.0–0.5)
Eosinophils Relative: 1 %
HCT: 35.7 % — ABNORMAL LOW (ref 36.0–46.0)
Hemoglobin: 11.8 g/dL — ABNORMAL LOW (ref 12.0–15.0)
Immature Granulocytes: 0 %
Lymphocytes Relative: 30 %
Lymphs Abs: 2.5 K/uL (ref 0.7–4.0)
MCH: 28.2 pg (ref 26.0–34.0)
MCHC: 33.1 g/dL (ref 30.0–36.0)
MCV: 85.2 fL (ref 80.0–100.0)
Monocytes Absolute: 0.8 K/uL (ref 0.1–1.0)
Monocytes Relative: 9 %
Neutro Abs: 4.9 K/uL (ref 1.7–7.7)
Neutrophils Relative %: 59 %
Platelet Count: 215 K/uL (ref 150–400)
RBC: 4.19 MIL/uL (ref 3.87–5.11)
RDW: 13 % (ref 11.5–15.5)
WBC Count: 8.3 K/uL (ref 4.0–10.5)
nRBC: 0 % (ref 0.0–0.2)

## 2023-12-21 LAB — CMP (CANCER CENTER ONLY)
ALT: 24 U/L (ref 0–44)
AST: 23 U/L (ref 15–41)
Albumin: 4.5 g/dL (ref 3.5–5.0)
Alkaline Phosphatase: 62 U/L (ref 38–126)
Anion gap: 12 (ref 5–15)
BUN: 26 mg/dL — ABNORMAL HIGH (ref 8–23)
CO2: 23 mmol/L (ref 22–32)
Calcium: 9.9 mg/dL (ref 8.9–10.3)
Chloride: 99 mmol/L (ref 98–111)
Creatinine: 0.88 mg/dL (ref 0.44–1.00)
GFR, Estimated: >60 mL/min (ref >60)
Glucose, Bld: 119 mg/dL — ABNORMAL HIGH (ref 70–99)
Potassium: 5.2 mmol/L — ABNORMAL HIGH (ref 3.5–5.1)
Sodium: 134 mmol/L — ABNORMAL LOW (ref 135–145)
Total Bilirubin: 0.5 mg/dL (ref 0.0–1.2)
Total Protein: 7.2 g/dL (ref 6.5–8.1)

## 2023-12-21 LAB — IRON AND IRON BINDING CAPACITY (CC-WL,HP ONLY)
Iron: 63 ug/dL (ref 28–170)
Saturation Ratios: 19 % (ref 10.4–31.8)
TIBC: 333 ug/dL (ref 250–450)
UIBC: 270 ug/dL

## 2023-12-21 LAB — FERRITIN: Ferritin: 423 ng/mL — ABNORMAL HIGH (ref 11–307)

## 2023-12-21 NOTE — Progress Notes (Signed)
 Hematology and Oncology Follow Up Visit  [name] [id number] Mar 27, 1940 83 y.o. 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 daily - d/c on 05/24/2023 Venofer  --  last given 10/25/2023       Interim History:  [name] is back for follow-up.  She is still doing quite well.  She really is bothered by arthritis in her right knee.  She is going have some physical therapy for this.  It sounds like she is going to have surgery for this.  Outside of that, she is doing pretty well.  She really has had no problems with nausea or vomiting.  She has had no change in bowel or bladder habits.  She has had no rashes.  There is been no bleeding.  We did have to give her some iron  back in September.  At that time, her ferritin was 335 with an iron  saturation of 15%.  Overall, I would say that her performance status right now is probably ECOG 1.    Medications:  Current Outpatient Medications:    Accu-Chek Softclix Lancets lancets, Check blood sugars 1-2 times daily, Disp: 200 each, Rfl: 4   acetaminophen  (TYLENOL ) 325 MG tablet, Take 2 tablets (650 mg total) by mouth every 6 (six) hours as needed for up to 30 doses for mild pain or moderate pain., Disp: 30 tablet, Rfl: 0   amLODipine  (NORVASC ) 5 MG tablet, TAKE 1 TABLET BY MOUTH DAILY, Disp: 100 tablet, Rfl: 2   aspirin  EC 81 MG tablet, Take 81 mg by mouth daily. Swallow whole., Disp: , Rfl:    Blood Glucose Monitoring Suppl (ACCU-CHEK GUIDE ME) w/Device KIT, CHECK BLOOD SUGAR 3 TIMES DAILY OR AS NEEDED, Disp: 1 kit, Rfl: 0   calcium -vitamin D  (OSCAL WITH D) 500-5 MG-MCG tablet, Take 1 tablet by mouth daily., Disp: , Rfl:    cholecalciferol  (VITAMIN D ) 1000 UNITS  tablet, Take 1,000 Units by mouth daily., Disp: , Rfl:    diclofenac  Sodium (VOLTAREN ) 1 % GEL, Apply 4 g topically in the morning and at bedtime. As needed for right knee pain, Disp: , Rfl:    dicyclomine  (BENTYL ) 10 MG capsule, TAKE 1 CAPSULE BY MOUTH AS  NEEDED EVERY 12 HOURS, Disp: 60 capsule, Rfl: 0   famotidine  (PEPCID ) 40 MG tablet, Take 1 tablet (40 mg total) by mouth at bedtime as needed for heartburn or indigestion., Disp: 90 tablet, Rfl: 1   furosemide  (LASIX ) 20 MG tablet, TAKE 1 TABLET BY MOUTH DAILY AS  NEEDED, Disp: 100 tablet, Rfl: 2   glucose blood (ACCU-CHEK GUIDE TEST) test strip, Check blood sugars 1-2 times daily, Disp: 200 each, Rfl: 12   losartan  (COZAAR ) 50 MG tablet, TAKE 1 TABLET BY MOUTH TWICE  DAILY, Disp: 200 tablet, Rfl: 2   metFORMIN  (GLUCOPHAGE -XR) 500 MG 24 hr tablet, TAKE 1 TABLET BY MOUTH IN THE  MORNING AND 1 TABLET BY MOUTH AT BEDTIME (Patient taking differently: Twice a day before meals), Disp: 200 tablet, Rfl: 2   metoCLOPramide  (REGLAN ) 5 MG tablet, TAKE 1 TABLET BY MOUTH TWICE  DAILY AS NEEDED, Disp: 180 tablet, Rfl: 2   metoprolol  tartrate (LOPRESSOR ) 50 MG tablet, TAKE 1 TABLET BY MOUTH TWICE  DAILY,  Disp: 200 tablet, Rfl: 2   pantoprazole  (PROTONIX ) 40 MG tablet, TAKE 1 TABLET BY MOUTH DAILY, Disp: 100 tablet, Rfl: 2   potassium chloride  (KLOR-CON  M) 10 MEQ tablet, TAKE 1 TABLET BY MOUTH DAILY, Disp: 100 tablet, Rfl: 2   rosuvastatin  (CRESTOR ) 20 MG tablet, Take 1 tablet (20 mg total) by mouth daily., Disp: 90 tablet, Rfl: 1   thiamine  (VITAMIN B-1) 100 MG tablet, Take 100 mg by mouth every other day., Disp: , Rfl:    vitamin E 180 MG (400 UNITS) capsule, Take 400 Units by mouth daily., Disp: , Rfl:   Allergies:  Allergies  Allergen Reactions   Tramadol  Other (See Comments)    Dizziness     Past Medical History, Surgical history, Social history, and Family History were reviewed and updated.  Review of Systems: Review of Systems  Constitutional:  Negative.   HENT:  Negative.    Eyes: Negative.   Respiratory: Negative.    Cardiovascular: Negative.   Gastrointestinal: Negative.   Endocrine: Negative.   Genitourinary: Negative.    Musculoskeletal: Negative.   Skin: Negative.   Neurological: Negative.   Hematological: Negative.   Psychiatric/Behavioral: Negative.      Physical Exam:  weight is 166 lb 0.6 oz (75.3 kg). Her oral temperature is 98.6 F (37 C). Her blood pressure is 131/53 (abnormal) and her pulse is 59 (abnormal). Her respiration is 18 and oxygen saturation is 100%.   Wt Readings from Last 3 Encounters:  12/21/23 166 lb 0.6 oz (75.3 kg)  11/28/23 167 lb (75.8 kg)  11/27/23 167 lb (75.8 kg)    Physical Exam Vitals reviewed.  Constitutional:      Comments: Left breast with no masses, edema or erythema.  There is no left axillary adenopathy.  Right breast shows the lumpectomy incision  at about the 9 o'clock position.   There is no distinct mass.  There is no palpable right axillary adenopathy.  HENT:     Head: Normocephalic and atraumatic.  Eyes:     Pupils: Pupils are equal, round, and reactive to light.  Cardiovascular:     Rate and Rhythm: Normal rate and regular rhythm.     Heart sounds: Normal heart sounds.  Pulmonary:     Effort: Pulmonary effort is normal.     Breath sounds: Normal breath sounds.  Abdominal:     General: Bowel sounds are normal.     Palpations: Abdomen is soft.  Musculoskeletal:        General: No tenderness or deformity. Normal range of motion.     Cervical back: Normal range of motion.  Lymphadenopathy:     Cervical: No cervical adenopathy.  Skin:    General: Skin is warm and dry.     Findings: No erythema or rash.  Neurological:     Mental Status: She is alert and oriented to person, place, and time.  Psychiatric:        Behavior: Behavior normal.        Thought Content: Thought content normal.        Judgment: Judgment normal.     Lab Results  Component Value Date    WBC 8.3 12/21/2023   HGB 11.8 (L) 12/21/2023   HCT 35.7 (L) 12/21/2023   MCV 85.2 12/21/2023   PLT 215 12/21/2023     Chemistry      Component Value Date/Time   NA 134 (L) 12/21/2023 1158   NA 136 05/08/2016 1305   NA 134 (L) 07/08/2015  1149   K 5.2 (H) 12/21/2023 1158   K 4.2 05/08/2016 1305   K 4.4 07/08/2015 1149   CL 99 12/21/2023 1158   CL 102 05/08/2016 1305   CO2 23 12/21/2023 1158   CO2 26 05/08/2016 1305   CO2 25 07/08/2015 1149   BUN 26 (H) 12/21/2023 1158   BUN 16 05/08/2016 1305   BUN 15.5 07/08/2015 1149   CREATININE 0.88 12/21/2023 1158   CREATININE 0.81 07/19/2023 0938   CREATININE 0.8 07/08/2015 1149      Component Value Date/Time   CALCIUM  9.9 12/21/2023 1158   CALCIUM  9.4 05/08/2016 1305   CALCIUM  10.0 07/08/2015 1149   ALKPHOS 62 12/21/2023 1158   ALKPHOS 56 05/08/2016 1305   ALKPHOS 62 07/08/2015 1149   AST 23 12/21/2023 1158   AST 24 07/08/2015 1149   ALT 24 12/21/2023 1158   ALT 29 05/08/2016 1305   ALT 30 07/08/2015 1149   BILITOT 0.5 12/21/2023 1158   BILITOT 0.64 07/08/2015 1149      Impression and Plan: Ms. Schoof is a very charming and very youthful appearing 83 year old [place] female.  She has history of stage I infiltrating ductal carcinoma of the right breast.  She had a low Oncotype score.  She did not require radiotherapy.  I hate that she has a knee problem.  Again, if she needs surgery, I do not see a problem from my point of view.  We will see what her iron  levels show.  I will get those back over the weekend.  I know that she will have a wonderful Holiday season.  I will plan to see her back probably in about 3 months or so.   Maude JONELLE Crease, MD 11/14/202512:45 PM

## 2023-12-22 ENCOUNTER — Ambulatory Visit: Payer: Self-pay | Admitting: Hematology & Oncology

## 2023-12-31 NOTE — Therapy (Signed)
 OUTPATIENT PHYSICAL THERAPY LOWER EXTREMITY EVALUATION   Patient Name: [name] MRN: [id number] DOB:Dec 11, 1940, 83 y.o., female Today's Date: 01/09/2024   END OF SESSION:  PT End of Session - 01/09/24 1105     Visit Number 1    Date for Recertification  04/02/24    Authorization Type [employer] Medicare    Authorization Time Period Auth pending    Authorization - Visit Number 1    PT Start Time 1105    PT Stop Time 1159    PT Time Calculation (min) 54 min    Activity Tolerance Patient limited by pain    Behavior During Therapy [facility] for tasks assessed/performed          Past Medical History:  Diagnosis Date   Abdominal pain in female 03/18/2010   Qualifier: Diagnosis of  By: Avram MD, NOLIA Pitts E    Anemia 06/08/2014   Anxiety    Arthritis    Spinal Osteoarthritis   Breast cancer (HCC)    Carcinoid tumor of stomach (HCC)    Cataract    Chest pain    Myoview  12/15 no ischemia.   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 09/05/2006   Qualifier: Diagnosis of  By: Wilhemina RMA, Lucy     Diabetic peripheral neuropathy (HCC) 10/29/2013   Encounter for preventative adult health care exam with abnormal findings 09/14/2013   Esophageal reflux    Gastric polyp    Fundic Gland   Gastroparesis    Headache(784.0)    Heart murmur    Echocardiogram 2/11: EF 60-65%, mild LAE, grade 1 diastolic dysfunction, aortic valve sclerosis, mean gradient 9 mm of mercury, PASP 34   Hematuria 03/16/2016   Iron  deficiency anemia, unspecified    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   Leg swelling    bilateral   Neck pain 04/22/2015   PONV (postoperative nausea and vomiting)    pt states body temperature drops every time she has anesthesia; pt states only needs small amount of anesthesia   PSVT (paroxysmal supraventricular tachycardia)    Pure hypercholesterolemia    Recurrent UTI 01/11/2016   Renal  insufficiency 06/18/2019   pt states  L kidney function very low-functions at about 20%   Stroke [facility])    tia, 2014   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   Type II or unspecified type diabetes mellitus without mention of complication, not stated as uncontrolled    Unspecified essential hypertension    Unspecified hereditary and idiopathic peripheral neuropathy 10/29/2013   Vitamin D  deficiency    Past Surgical History:  Procedure Laterality Date   BREAST BIOPSY Right 06/13/2022   US  RT BREAST BX W LOC DEV 1ST LESION IMG BX SPEC US  GUIDE 06/13/2022 [facility]AMMOGRAP[facility]   BREAST LUMPECTOMY Right 07/31/2022   Procedure: RIGHT BREAST LUMPECTOMY;  Surgeon: Vernetta Berg, MD;  Location: [facility];  Service: General;  Laterality: Right;   CHOLECYSTECTOMY  1993   COLONOSCOPY  11/11/2010   diverticulosis   DILATATION & CURRETTAGE/HYSTEROSCOPY WITH RESECTOCOPE N/A 02/25/2013   Procedure: Attempted hysteroscopy with uterine perforation;  Surgeon: Bobie FORBES Crown de Charlynn FORBES Cary, MD;  Location: [facility];  Service: Gynecology;  Laterality: N/A;   ESOPHAGOGASTRODUODENOSCOPY  08/29/2010; 09/15/2010   Carcinoid tumor less than 1 cm in July 2012 not seen in August 2012 , gastritis, fundic gland polyps   ESOPHAGOGASTRODUODENOSCOPY  05/16/2011   ESOPHAGOGASTRODUODENOSCOPY  06/14/2012  EUS  12/15/2010   Procedure: UPPER ENDOSCOPIC ULTRASOUND (EUS) LINEAR;  Surgeon: Toribio Cedar, MD;  Location: [facility];  Service: Endoscopy;  Laterality: N/A;   EYE SURGERY Bilateral    Bi lateral cateracts and bi lateral laser   LAPAROSCOPY N/A 02/25/2013   Procedure: Cystoscopy and laparoscopy with fulguration of uterine serosa;  Surgeon: Bobie FORBES Crown de Charlynn FORBES Cary, MD;  Location: [facility];  Service: Gynecology;  Laterality: N/A;   TONSILLECTOMY     Patient Active Problem List   Diagnosis Date Noted   Acute pain of right knee 11/20/2023   Precordial chest pain 12/24/2022   Carcinoma of breast upper outer  quadrant, right (HCC) 07/04/2022   SVT (supraventricular tachycardia) 06/21/2022   Left-sided weakness 04/02/2022   Unsteady gait 04/02/2022   Oral lesion 03/08/2021   Sun-damaged skin 03/08/2021   Thiamine  deficiency 11/01/2020   Trigeminal neuralgia 08/21/2020   Pedal edema 08/21/2020   Chronic left-sided back pain 03/28/2020   Nocturia 03/28/2020   Left-sided headache 03/28/2020   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   Renal insufficiency 06/18/2019   Educated about COVID-19 virus infection 05/15/2019   Atrophic vaginitis 01/20/2019   Stenosis of carotid artery 11/12/2018   Anxiety 10/13/2018   Referred otalgia of left ear 04/23/2018   Chronic throat clearing 04/23/2018   Sinusitis 10/11/2017   Headache 07/12/2017   Dizzy spells 11/21/2016   Constipation 11/21/2016   Nonrheumatic aortic valve stenosis 05/23/2016   Aortic atherosclerosis 05/23/2016   Hematuria 03/16/2016   Recurrent UTI 01/11/2016   Pain of upper abdomen 06/06/2015   Neck pain 04/22/2015   Ear pain 02/28/2015   Abnormal urine 11/21/2014   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   Anemia 06/08/2014   RLS (restless legs syndrome) 11/23/2013   Diabetic peripheral neuropathy (HCC) 10/29/2013   Left-sided thoracic back pain 10/06/2013   Encounter for preventative adult health care exam with abnormal findings 09/14/2013   Iron  deficiency anemia    Status post laparoscopy 02/25/2013   Hyponatremia 01/09/2013   GERD (gastroesophageal reflux disease) 01/09/2013   Amaurosis fugax of left eye 10/16/2012   Low back pain 06/03/2012   Vitamin D  deficiency 03/11/2012   Bilateral hand pain 10/20/2011   Encounter for long-term (current) use of other medications 10/20/2011   IBS (irritable bowel syndrome) 08/14/2011   TIA (transient ischemic attack) 02/10/2011   Abnormal brain CT 01/19/2011   Allergic rhinitis 10/01/2010   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   Preventative health care  07/15/2010   FUNDIC GLAND POLYPS OF STOMACH 03/18/2010   Abdominal pain in female 03/18/2010   Left hip pain 03/17/2009   SYSTOLIC MURMUR 03/02/2009   Paroxysmal supraventricular tachycardia 01/12/2009   PLANTAR FASCIITIS 06/08/2008   CHEST PAIN 05/18/2008   Gastroparesis 12/18/2007   HYPERCHOLESTEROLEMIA 06/11/2007   Type 2 diabetes mellitus with obesity 09/05/2006   Essential hypertension 09/05/2006    PCP: Domenica Harlene LABOR, MD   REFERRING PROVIDER: Charles Redell LABOR, DO  REFERRING DIAG: M17.11 (ICD-10-CM) - Primary osteoarthritis of right knee   THERAPY DIAG:  Acute pain of right knee  Stiffness of right knee, not elsewhere classified  Muscle weakness (generalized)  Other abnormalities of gait and mobility  Unsteadiness on feet  RATIONALE FOR EVALUATION AND TREATMENT: Rehabilitation  ONSET DATE: early October  NEXT MD VISIT: 01/16/2024   SUBJECTIVE:  SUBJECTIVE STATEMENT: Pt reports on Sunday in early October she was out for dinner with her family and the next day her knee was swollen and she could barely put weight on it.  She waited about 2 weeks before going to see the doctor.  MD drew some fluid off and gave her a cortisone shot which helped for ~1 week but now the pain is back and she feels crippled. Pain prevents her from walking much or going out.  Difficulty putting on her clothes and shoes.  She also notes a burning feeling in her knee and anterior leg.  She has variable swelling in her knee - better today after taking lasix  yesterday.  She states she has to use a cane at home but did not bring it with her to PT today.  She states now her R hip and back are also hurting due to limping for her knee.  Pain prevents her from sleeping, esp on her sides, which is her  preferred sleeping position.  PAIN: Are you having pain? Yes: NPRS scale: 10/10  Pain location: R knee, lower thigh and upper lower leg  Pain description: sharp  Aggravating factors: walking, prolonged standing, lower body dressing  Relieving factors: Voltaren  gel, Tylenol , ice - nothing give much relief  PERTINENT HISTORY:  Extensive PMH including chronic back and neck pain, OA, osteoporosis, DM-II, HTN, h/o TIA, diabetic peripheral neuropathy, PVD, headaches, sleep dysfunction, anxiety, R breast cancer s/p lumpectomy 07/31/22, SVT (refer to above problem list and past medical/surgical history for full PMH)   PRECAUTIONS: Fall  RED FLAGS: None  WEIGHT BEARING RESTRICTIONS: No  FALLS:  Has patient fallen in last 6 months? No  LIVING ENVIRONMENT: Lives with: lives alone Lives in: House Stairs: No Has following equipment at home: Single point cane, shower chair, and Grab bars  OCCUPATION: Retired  PLOF: Independent, Needs assistance with homemaking, and Leisure: read and play card games on her phone, traveling, and housekeeping/cleaning service ~1x/month   PATIENT GOALS: Just to walk freely with no pain.   OBJECTIVE: (objective measures completed at initial evaluation unless otherwise dated)  DIAGNOSTIC FINDINGS:  11/27/23 - DG RIGHT KNEE - COMPLETE 4+ VIEW CLINICAL DATA:  Right knee pain. Primary osteoarthritis of right knee. Acute knee pain.   FINDINGS: The alignment and joint spaces are preserved. Mild tricompartmental peripheral spurring and spurring of the tibial spines. Small knee joint effusion. No fracture, erosion, or focal bone abnormality. Frontal view of the left knee included for comparison demonstrates tibiofemoral osteoarthritis.   IMPRESSION: Mild tricompartmental osteoarthritis of the right knee. Small knee joint effusion.  PATIENT SURVEYS:  LEFS  Extreme difficulty/unable (0), Quite a bit of difficulty (1), Moderate difficulty (2), Little difficulty  (3), No difficulty (4) Survey date:  01/09/24   Any of your usual work, housework or school activities 0  2. Usual hobbies, recreational or sporting activities 0  3. Getting into/out of the bath 1  4. Walking between rooms 1  5. Putting on socks/shoes 0  6. Squatting  0  7. Lifting an object, like a bag of groceries from the floor 0  8. Performing light activities around your home 0  9. Performing heavy activities around your home 0  10. Getting into/out of a car 0  11. Walking 2 blocks 0  12. Walking 1 mile 0  13. Going up/down 10 stairs (1 flight) 0  14. Standing for 1 hour 0  15.  sitting for 1 hour 0  16. Running on  even ground 0  17. Running on uneven ground 0  18. Making sharp turns while running fast 0  19. Hopping  0  20. Rolling over in bed 0  Score total:  2/80  Functional limitation: Severe     COGNITION: Overall cognitive status: Within functional limits for tasks assessed    SENSATION: WFL Pt reports burning and itching in her knee at times  EDEMA:  Circumferential:   Mid patella: R = 46.0 cm L = 43.5 cm  6 cm above: R = 49.0 cm L = 45.0 cm  6 cm below: R = 43.0 cm L = 40.0 cm  POSTURE:  rounded shoulders, forward head, and weight shift left  PALPATION: Very TTP over patella and anterior knee - increased edema apparent with decreased patellar mobility and poor quad activation (especially VMO) with quad set  MUSCLE LENGTH: TBA next visit Hamstrings:  ITB:  Piriformis:  Hip flexors:  Quads:  Heelcord:   LOWER EXTREMITY ROM:  Active ROM Right eval Left eval  Knee flexion 82 120  Knee extension 18 LAQ /  8 supported 0  (Blank rows = not tested)  LOWER EXTREMITY MMT: (Tested in sitting on eval)  MMT Right eval Left eval  Hip flexion 3 3+  Hip extension 2 3-  Hip abduction 3+ 4-  Hip adduction 3+ 4-  Hip internal rotation    Hip external rotation    Knee flexion 3+ 4-  Knee extension 3- 4  Ankle dorsiflexion 3+ 4-  Ankle  plantarflexion    Ankle inversion    Ankle eversion     (Blank rows = not tested)  FUNCTIONAL TESTS: TBA next visit 5 times sit to stand:   Timed up and go (TUG):   10 meter walk test:    GAIT: Distance walked: 50' Assistive device utilized: Single point cane and None Level of assistance: SBA Gait pattern: step to pattern, decreased step length- Left, decreased stride length, decreased hip/knee flexion- Right, antalgic, and lateral lean- Left Comments: Increased instability evident with patient reporting she uses SPC at home but did not bring it with her to PT   TODAY'S TREATMENT:   01/09/24 - Eval SELF CARE:   Reviewed eval findings and role of PT in addressing identified deficits as well as instruction in initial HEP (see below).  Provided instruction in gait training with SPC in L hand to offset R knee.  Patient only able to demonstrate step to pattern despite repeated verbal cues and demonstration of step through pattern.  Further training indicated next visit. Discussed taking her lasix  more frequently (prescribed for daily PRN) to help with LE edema as she notes slight improvement in LE edema and knee pain after she takes the lasix .  Also explained that her potassium supplement should be taken with the lasix  to offset the loss of potassium as a result of the lasix .    PATIENT EDUCATION:  Education details: PT eval findings, anticipated POC, initial HEP, and gait safety with SPC  Person educated: Patient Education method: Explanation, Demonstration, Verbal cues, Tactile cues, and Handouts Education comprehension: verbalized understanding, returned demonstration, verbal cues required, tactile cues required, and needs further education  HOME EXERCISE PROGRAM: Access Code: [email or web address] URL: [email or web address] Date: 01/09/2024 Prepared by: Elijah Hidden  Exercises - Supine Quad Set on Towel Roll  - 2-3 x daily - 7 x weekly - 2 sets - 10 reps - 5 sec hold - Seated  Heel Slide  - 2-3 x daily -  7 x weekly - 2 sets - 10 reps - 3 sec hold - Seated Long Arc Quad  - 2-3 x daily - 7 x weekly - 2 sets - 10 reps - 3 sec hold   ASSESSMENT:  CLINICAL IMPRESSION: Ashtynn N Hogeland is a 83 y.o. female who was referred to physical therapy for evaluation and treatment for acute R knee pain secondary to osteoarthritis.  Patient reports onset of R knee pain beginning in early October without known injury or precipitating event, other than having gone out to dinner with her family the night before.  Pain is worse with any standing or walking, as well as lower body dressing.  Patient has deficits in R knee ROM, B LE flexibility, B LE strength, abnormal posture, antalgic gait pattern with evidence of gait instability, and TTP with abnormal muscle tension which are interfering with ADLs and are impacting quality of life.  On LEFS patient scored 2/80 demonstrating severe functional limitation.  Further standardized balance testing to be completed next visit.  Evamaria will benefit from skilled PT to address above deficits to improve mobility and activity tolerance with decreased pain interference.  OBJECTIVE IMPAIRMENTS: Abnormal gait, decreased activity tolerance, decreased balance, decreased endurance, decreased knowledge of condition, decreased knowledge of use of DME, decreased mobility, difficulty walking, decreased ROM, decreased strength, decreased safety awareness, increased edema, increased fascial restrictions, impaired perceived functional ability, increased muscle spasms, impaired flexibility, improper body mechanics, postural dysfunction, and pain.   ACTIVITY LIMITATIONS: carrying, lifting, bending, sitting, standing, squatting, sleeping, stairs, transfers, bed mobility, bathing, dressing, and locomotion level  PARTICIPATION LIMITATIONS: meal prep, cleaning, laundry, driving, shopping, community activity, and church  PERSONAL FACTORS: Age, Behavior pattern, Fitness,  Past/current experiences, Time since onset of injury/illness/exacerbation, and 3+ comorbidities: Extensive PMH including chronic back and neck pain, OA, osteoporosis, DM-II, HTN, h/o TIA, diabetic peripheral neuropathy, PVD, headaches, sleep dysfunction, anxiety, R breast cancer s/p lumpectomy 07/31/22, SVT  are also affecting patient's functional outcome.   REHAB POTENTIAL: Good  CLINICAL DECISION MAKING: Evolving/moderate complexity  EVALUATION COMPLEXITY: Moderate   GOALS: Goals reviewed with patient? Yes  SHORT TERM GOALS: Target date: 02/20/2024  Patient will be independent with initial HEP. Baseline: HEP initiated on eval Goal status: INITIAL  2.  Patient will report at least 25% improvement in R knee pain to improve QOL. Baseline: 10/10 Goal status: INITIAL  3.  Patient will demonstrate improved R knee AROM to >/= 10-95 to allow for normal gait and stair mechanics. Baseline: Refer to above LE ROM table - R knee AROM 18-82, with 8 Extension lacking when knee supported  Goal status: INITIAL  4.  Complete standardized balance testing and update POC/goals as indicated. Baseline:  Goal status: INITIAL    LONG TERM GOALS: Target date: 04/02/2024  Patient will be independent with advanced/ongoing HEP to improve outcomes and carryover.  Baseline:  Goal status: INITIAL  2.  Patient will report at least 50% improvement in R knee pain to improve QOL. Baseline: 10/10 Goal status: INITIAL  3.  Patient will demonstrate improved R knee AROM to >/= 5-110 to allow for normal gait and stair mechanics. Baseline: Refer to above LE ROM table - R knee AROM 18-82, with 8 Extension lacking when knee supported  Goal status: INITIAL  4.  Patient will demonstrate improved B LE strength to >/= 4 to 4+/5 for improved stability and ease of mobility. Baseline: Refer to above LE MMT table Goal status: INITIAL  5.  Patient will be able  to ambulate 600' with LRAD and normal gait pattern  without increased pain to access community.  Baseline: Antalgic step to pattern with SPC on L, worsening antalgic gait pattern without AD Goal status: INITIAL  6. Patient will report >/= 30/80 on LEFS (MCID = 9 pts) to demonstrate improved functional ability. Baseline: 2 / 80 = 2.5 % Goal status: INITIAL  7.  Patient will demonstrate at least 19/24 on DGI to decrease risk of falls. Baseline: NT on eval, 21/24 as of DC from PT on 07/09/23 Goal status: INITIAL     PLAN:  PT FREQUENCY: 2x/week  PT DURATION: 12 weeks  PLANNED INTERVENTIONS: 97164- PT Re-evaluation, 97750- Physical Performance Testing, 97110-Therapeutic exercises, 97530- Therapeutic activity, 97112- Neuromuscular re-education, 97535- Self Care, 02859- Manual therapy, [phone]- Gait training, [phone]- Aquatic Therapy, [phone]- Electrical stimulation (unattended), 97016- Vasopneumatic device, [id number]- Ultrasound, [id number]- Ionotophoresis 4mg /ml Dexamethasone , 79439 (1-2 muscles), 20561 (3+ muscles)- Dry Needling, Patient/Family education, Balance training, Stair training, Taping, Joint mobilization, DME instructions, Cryotherapy, and Moist heat  PLAN FOR NEXT SESSION: Complete standardized balance testing - 5xSTS, TUG, and DGI; review initial HEP; gently progress R knee ROM; LE strengthening with emphasis on quads and hip stability; manual therapy and/or modalities PRN for pain management   Elijah CHRISTELLA Hidden, PT 01/09/2024, 1:46 PM    Date of referral: 12/20/2023 Referring provider: Charles Redell LABOR, DO Referring diagnosis? M17.11 (ICD-10-CM) - Primary osteoarthritis of right knee Treatment diagnosis? (if different than referring diagnosis)  Acute pain of right knee  Stiffness of right knee, not elsewhere classified  Muscle weakness (generalized)  Other abnormalities of gait and mobility  Unsteadiness on feet  What was this (referring dx) caused by? Arthritis  Nature of Condition: Initial Onset (within last 3  months)   Laterality: Rt  Current Functional Measure Score: LEFS 2 / 80 = 2.5 %  Objective measurements identify impairments when they are compared to normal values, the uninvolved extremity, and prior level of function.  [x]  Yes  []  No  Objective assessment of functional ability: Severe functional limitations   Briefly describe symptoms:  Fidelis N Leabo is a 83 y.o. female who was referred to physical therapy for evaluation and treatment for acute R knee pain secondary to osteoarthritis.  Patient reports onset of R knee pain beginning in early October without known injury or precipitating event, other than having gone out to dinner with her family the night before.  Pain is worse with any standing or walking, as well as lower body dressing.  Patient has deficits in R knee ROM, B LE flexibility, B LE strength, abnormal posture, antalgic gait pattern with evidence of gait instability, and TTP with abnormal muscle tension which are interfering with ADLs and are impacting quality of life.  On LEFS patient scored 2/80 demonstrating severe functional limitation.  Further standardized balance testing to be completed next visit.  How did symptoms start: Sudden onset of pain and swelling in her R knee the day after she had been out with family  Average pain intensity:  Last 24 hours: 10/10  Past week: 10/10  How often does the pt experience symptoms? Constantly  How much have the symptoms interfered with usual daily activities? Extremely  How has condition changed since care began at this facility? NA - initial visit  In general, how is the patients overall health? Fair  Onset date: early October   BACK PAIN (STarT Back Screening Tool) - (When applicable): N/A  Has your back pain spread down your  leg(s) at sometime in the last 2 weeks? []  Yes   []  No Have you had pain in the shoulder or neck at sometime in the past 2 weeks? []  Yes   []  No Have you only walked short distances  because of your back pain? []  Yes   []  No In the past 2 weeks, have you dressed more slowly than usual because of your back pain? []  Yes   []  No Do you think it is not really safe for person with a condition like yours to be physically active? []  Yes   []  No Have worrying thoughts been going through your mind a lot of the time? []  Yes   []  No Do you feel that your back pain is terrible and it is never going to get any better? []  Yes   []  No In general, have you stopped enjoying all the things you usually enjoy? []  Yes   []  No Overall, how bothersome has your back pain been in the last 2 weeks? []  Not at all   []  Slightly     []  Moderate   []  Very much     []  Extremely

## 2024-01-09 ENCOUNTER — Other Ambulatory Visit: Payer: Self-pay

## 2024-01-09 ENCOUNTER — Encounter: Payer: Self-pay | Admitting: Physical Therapy

## 2024-01-09 ENCOUNTER — Ambulatory Visit: Admitting: Physical Therapy

## 2024-01-09 DIAGNOSIS — M25561 Pain in right knee: Secondary | ICD-10-CM | POA: Insufficient documentation

## 2024-01-09 DIAGNOSIS — M6281 Muscle weakness (generalized): Secondary | ICD-10-CM | POA: Insufficient documentation

## 2024-01-09 DIAGNOSIS — R2681 Unsteadiness on feet: Secondary | ICD-10-CM | POA: Diagnosis present

## 2024-01-09 DIAGNOSIS — R2689 Other abnormalities of gait and mobility: Secondary | ICD-10-CM | POA: Diagnosis present

## 2024-01-09 DIAGNOSIS — M1711 Unilateral primary osteoarthritis, right knee: Secondary | ICD-10-CM | POA: Diagnosis not present

## 2024-01-09 DIAGNOSIS — M25661 Stiffness of right knee, not elsewhere classified: Secondary | ICD-10-CM | POA: Diagnosis present

## 2024-01-11 ENCOUNTER — Ambulatory Visit: Admitting: Physical Therapy

## 2024-01-11 ENCOUNTER — Other Ambulatory Visit (HOSPITAL_BASED_OUTPATIENT_CLINIC_OR_DEPARTMENT_OTHER): Payer: Self-pay

## 2024-01-13 ENCOUNTER — Other Ambulatory Visit: Payer: Self-pay | Admitting: Family Medicine

## 2024-01-15 ENCOUNTER — Ambulatory Visit: Admitting: Physical Therapy

## 2024-01-16 ENCOUNTER — Ambulatory Visit

## 2024-01-16 ENCOUNTER — Ambulatory Visit: Admitting: Physical Therapy

## 2024-01-16 ENCOUNTER — Encounter: Payer: Self-pay | Admitting: Physical Therapy

## 2024-01-16 DIAGNOSIS — M6281 Muscle weakness (generalized): Secondary | ICD-10-CM

## 2024-01-16 DIAGNOSIS — R2689 Other abnormalities of gait and mobility: Secondary | ICD-10-CM

## 2024-01-16 DIAGNOSIS — M25561 Pain in right knee: Secondary | ICD-10-CM

## 2024-01-16 DIAGNOSIS — M25661 Stiffness of right knee, not elsewhere classified: Secondary | ICD-10-CM

## 2024-01-16 DIAGNOSIS — R2681 Unsteadiness on feet: Secondary | ICD-10-CM

## 2024-01-16 NOTE — Therapy (Signed)
 OUTPATIENT PHYSICAL THERAPY TREATMENT   Patient Name: [name] MRN: [id number] DOB:[birth year], 83 y.o., female Today's Date: 01/16/2024   END OF SESSION:  PT End of Session - 01/16/24 1100     Visit Number 2    Date for Recertification  04/02/24    Authorization Type [employer] Medicare    Authorization Time Period 01/09/24 - 04/02/24    Authorization - Visit Number 2    Authorization - Number of Visits 6    Progress Note Due on Visit 10    PT Start Time 1100    PT Stop Time 1201    PT Time Calculation (min) 61 min    Activity Tolerance Patient limited by pain    Behavior During Therapy [facility] for tasks assessed/performed           Past Medical History:  Diagnosis Date   Abdominal pain in female 03/18/2010   Qualifier: Diagnosis of  By: Avram MD, NOLIA Pitts E    Anemia 06/08/2014   Anxiety    Arthritis    Spinal Osteoarthritis   Breast cancer (HCC)    Carcinoid tumor of stomach (HCC)    Cataract    Chest pain    Myoview  12/15 no ischemia.   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 09/05/2006   Qualifier: Diagnosis of  By: Wilhemina RMA, Lucy     Diabetic peripheral neuropathy (HCC) 10/29/2013   Encounter for preventative adult health care exam with abnormal findings 09/14/2013   Esophageal reflux    Gastric polyp    Fundic Gland   Gastroparesis    Headache(784.0)    Heart murmur    Echocardiogram 2/11: EF 60-65%, mild LAE, grade 1 diastolic dysfunction, aortic valve sclerosis, mean gradient 9 mm of mercury, PASP 34   Hematuria 03/16/2016   Iron  deficiency anemia, unspecified    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   Leg swelling    bilateral   Neck pain 04/22/2015   PONV (postoperative nausea and vomiting)    pt states body temperature drops every time she has anesthesia; pt states only needs small amount of anesthesia   PSVT (paroxysmal supraventricular tachycardia)    Pure  hypercholesterolemia    Recurrent UTI 01/11/2016   Renal insufficiency 06/18/2019   pt states  L kidney function very low-functions at about 20%   Stroke [facility])    tia, 2014   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   Type II or unspecified type diabetes mellitus without mention of complication, not stated as uncontrolled    Unspecified essential hypertension    Unspecified hereditary and idiopathic peripheral neuropathy 10/29/2013   Vitamin D  deficiency    Past Surgical History:  Procedure Laterality Date   BREAST BIOPSY Right 06/13/2022   US  RT BREAST BX W LOC DEV 1ST LESION IMG BX SPEC US  GUIDE 06/13/2022 [facility] MAMMOGRAPHY   BREAST LUMPECTOMY Right 07/31/2022   Procedure: RIGHT BREAST LUMPECTOMY;  Surgeon: Vernetta Berg, MD;  Location: [facility];  Service: General;  Laterality: Right;   CHOLECYSTECTOMY  1993   COLONOSCOPY  11/11/2010   diverticulosis   DILATATION & CURRETTAGE/HYSTEROSCOPY WITH RESECTOCOPE N/A 02/25/2013   Procedure: Attempted hysteroscopy with uterine perforation;  Surgeon: Bobie FORBES Crown de Charlynn FORBES Cary, MD;  Location: [facility];  Service: Gynecology;  Laterality: N/A;   ESOPHAGOGASTRODUODENOSCOPY  08/29/2010; 09/15/2010   Carcinoid tumor less than 1 cm in July 2012 not seen  in August 2012 , gastritis, fundic gland polyps   ESOPHAGOGASTRODUODENOSCOPY  05/16/2011   ESOPHAGOGASTRODUODENOSCOPY  06/14/2012   EUS  12/15/2010   Procedure: UPPER ENDOSCOPIC ULTRASOUND (EUS) LINEAR;  Surgeon: Toribio Cedar, MD;  Location: [facility];  Service: Endoscopy;  Laterality: N/A;   EYE SURGERY Bilateral    Bi lateral cateracts and bi lateral laser   LAPAROSCOPY N/A 02/25/2013   Procedure: Cystoscopy and laparoscopy with fulguration of uterine serosa;  Surgeon: Bobie FORBES Crown de Charlynn FORBES Cary, MD;  Location: [facility];  Service: Gynecology;  Laterality: N/A;   TONSILLECTOMY     Patient Active Problem List   Diagnosis Date Noted   Acute pain of right knee 11/20/2023   Precordial  chest pain 12/24/2022   Carcinoma of breast upper outer quadrant, right (HCC) 07/04/2022   SVT (supraventricular tachycardia) 06/21/2022   Left-sided weakness 04/02/2022   Unsteady gait 04/02/2022   Oral lesion 03/08/2021   Sun-damaged skin 03/08/2021   Thiamine  deficiency 11/01/2020   Trigeminal neuralgia 08/21/2020   Pedal edema 08/21/2020   Chronic left-sided back pain 03/28/2020   Nocturia 03/28/2020   Left-sided headache 03/28/2020   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   Renal insufficiency 06/18/2019   Educated about COVID-19 virus infection 05/15/2019   Atrophic vaginitis 01/20/2019   Stenosis of carotid artery 11/12/2018   Anxiety 10/13/2018   Referred otalgia of left ear 04/23/2018   Chronic throat clearing 04/23/2018   Sinusitis 10/11/2017   Headache 07/12/2017   Dizzy spells 11/21/2016   Constipation 11/21/2016   Nonrheumatic aortic valve stenosis 05/23/2016   Aortic atherosclerosis 05/23/2016   Hematuria 03/16/2016   Recurrent UTI 01/11/2016   Pain of upper abdomen 06/06/2015   Neck pain 04/22/2015   Ear pain 02/28/2015   Abnormal urine 11/21/2014   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   Anemia 06/08/2014   RLS (restless legs syndrome) 11/23/2013   Diabetic peripheral neuropathy (HCC) 10/29/2013   Left-sided thoracic back pain 10/06/2013   Encounter for preventative adult health care exam with abnormal findings 09/14/2013   Iron  deficiency anemia    Status post laparoscopy 02/25/2013   Hyponatremia 01/09/2013   GERD (gastroesophageal reflux disease) 01/09/2013   Amaurosis fugax of left eye 10/16/2012   Low back pain 06/03/2012   Vitamin D  deficiency 03/11/2012   Bilateral hand pain 10/20/2011   Encounter for long-term (current) use of other medications 10/20/2011   IBS (irritable bowel syndrome) 08/14/2011   TIA (transient ischemic attack) 02/10/2011   Abnormal brain CT 01/19/2011   Allergic rhinitis 10/01/2010   Carcinoid tumor of  stomach- history of 09/29/2010   Preventative health care 07/15/2010   FUNDIC GLAND POLYPS OF STOMACH 03/18/2010   Abdominal pain in female 03/18/2010   Left hip pain 03/17/2009   SYSTOLIC MURMUR 03/02/2009   Paroxysmal supraventricular tachycardia 01/12/2009   PLANTAR FASCIITIS 06/08/2008   CHEST PAIN 05/18/2008   Gastroparesis 12/18/2007   HYPERCHOLESTEROLEMIA 06/11/2007   Type 2 diabetes mellitus with obesity 09/05/2006   Essential hypertension 09/05/2006    PCP: Domenica Harlene LABOR, MD   REFERRING PROVIDER: Charles Redell LABOR, DO  REFERRING DIAG: M17.11 (ICD-10-CM) - Primary osteoarthritis of right knee   THERAPY DIAG:  Acute pain of right knee  Stiffness of right knee, not elsewhere classified  Muscle weakness (generalized)  Other abnormalities of gait and mobility  Unsteadiness on feet  RATIONALE FOR EVALUATION AND TREATMENT: Rehabilitation  ONSET DATE: early October  NEXT MD VISIT: 01/16/2024   SUBJECTIVE:  SUBJECTIVE STATEMENT: Pt reports her pain remains very high - I'm suffering.  EVAL: Pt reports on Sunday in early October she was out for dinner with her family and the next day her knee was swollen and she could barely put weight on it.  She waited about 2 weeks before going to see the doctor.  MD drew some fluid off and gave her a cortisone shot which helped for ~1 week but now the pain is back and she feels crippled. Pain prevents her from walking much or going out.  Difficulty putting on her clothes and shoes.  She also notes a burning feeling in her knee and anterior leg.  She has variable swelling in her knee - better today after taking lasix  yesterday.  She states she has to use a cane at home but did not bring it with her to PT today.  She states now her R hip  and back are also hurting due to limping for her knee.  Pain prevents her from sleeping, esp on her sides, which is her preferred sleeping position.  PAIN: Are you having pain? Yes: NPRS scale: 10/10  Pain location: R knee, lower thigh and upper lower leg  Pain description: sharp  Aggravating factors: walking, prolonged standing, lower body dressing  Relieving factors: Voltaren  gel, Tylenol , ice - nothing give much relief  PERTINENT HISTORY:  Extensive PMH including chronic back and neck pain, OA, osteoporosis, DM-II, HTN, h/o TIA, diabetic peripheral neuropathy, PVD, headaches, sleep dysfunction, anxiety, R breast cancer s/p lumpectomy 07/31/22, SVT (refer to above problem list and past medical/surgical history for full PMH)   PRECAUTIONS: Fall  RED FLAGS: None  WEIGHT BEARING RESTRICTIONS: No  FALLS:  Has patient fallen in last 6 months? No  LIVING ENVIRONMENT: Lives with: lives alone Lives in: House Stairs: No Has following equipment at home: Single point cane, shower chair, and Grab bars  OCCUPATION: Retired  PLOF: Independent, Needs assistance with homemaking, and Leisure: read and play card games on her phone, traveling, and housekeeping/cleaning service ~1x/month   PATIENT GOALS: Just to walk freely with no pain.   OBJECTIVE: (objective measures completed at initial evaluation unless otherwise dated)  DIAGNOSTIC FINDINGS:  11/27/23 - DG RIGHT KNEE - COMPLETE 4+ VIEW CLINICAL DATA:  Right knee pain. Primary osteoarthritis of right knee. Acute knee pain.   FINDINGS: The alignment and joint spaces are preserved. Mild tricompartmental peripheral spurring and spurring of the tibial spines. Small knee joint effusion. No fracture, erosion, or focal bone abnormality. Frontal view of the left knee included for comparison demonstrates tibiofemoral osteoarthritis.   IMPRESSION: Mild tricompartmental osteoarthritis of the right knee. Small knee joint effusion.  PATIENT  SURVEYS:  LEFS  Extreme difficulty/unable (0), Quite a bit of difficulty (1), Moderate difficulty (2), Little difficulty (3), No difficulty (4) Survey date:  01/09/24   Any of your usual work, housework or school activities 0  2. Usual hobbies, recreational or sporting activities 0  3. Getting into/out of the bath 1  4. Walking between rooms 1  5. Putting on socks/shoes 0  6. Squatting  0  7. Lifting an object, like a bag of groceries from the floor 0  8. Performing light activities around your home 0  9. Performing heavy activities around your home 0  10. Getting into/out of a car 0  11. Walking 2 blocks 0  12. Walking 1 mile 0  13. Going up/down 10 stairs (1 flight) 0  14. Standing for 1 hour 0  15.  sitting for 1 hour 0  16. Running on even ground 0  17. Running on uneven ground 0  18. Making sharp turns while running fast 0  19. Hopping  0  20. Rolling over in bed 0  Score total:  2/80  Functional limitation: Severe     COGNITION: Overall cognitive status: Within functional limits for tasks assessed    SENSATION: WFL Pt reports burning and itching in her knee at times  EDEMA:  Circumferential:   Mid patella: R = 46.0 cm L = 43.5 cm  6 cm above: R = 49.0 cm L = 45.0 cm  6 cm below: R = 43.0 cm L = 40.0 cm  POSTURE:  rounded shoulders, forward head, and weight shift left  PALPATION: Very TTP over patella and anterior knee - increased edema apparent with decreased patellar mobility and poor quad activation (especially VMO) with quad set  MUSCLE LENGTH: TBA next visit Hamstrings:  ITB:  Piriformis:  Hip flexors:  Quads:  Heelcord:   LOWER EXTREMITY ROM:  Active ROM Right eval Left eval  Knee flexion 82 120  Knee extension 18 LAQ /  8 supported 0  (Blank rows = not tested)  LOWER EXTREMITY MMT: (Tested in sitting on eval)  MMT Right eval Left eval  Hip flexion 3 3+  Hip extension 2 3-  Hip abduction 3+ 4-  Hip adduction 3+ 4-  Hip internal  rotation    Hip external rotation    Knee flexion 3+ 4-  Knee extension 3- 4  Ankle dorsiflexion 3+ 4-  Ankle plantarflexion    Ankle inversion    Ankle eversion     (Blank rows = not tested)  FUNCTIONAL TESTS: 01/16/24 5 times sit to stand: 47.84 sec  Timed up and go (TUG): 44.75 sec with SPC  10 meter walk test: 54.47 sec with SPC  Gait speed = 0.60 ft/sec with SPC  GAIT: Distance walked: 50' Assistive device utilized: Single point cane and None Level of assistance: SBA Gait pattern: step to pattern, decreased step length- Left, decreased stride length, decreased hip/knee flexion- Right, antalgic, and lateral lean- Left Comments: Increased instability evident with patient reporting she uses SPC at home but did not bring it with her to PT   TODAY'S TREATMENT:   01/16/24  THERAPEUTIC ACTIVITIES: To improve functional performance.  Demonstration, verbal and tactile cues throughout for technique. 5xSTS = 47.84 sec TUG = 44.75 sec with SPC = 54.47 sec with SPC Gait speed = 0.60 ft/sec with SPC  GAIT TRAINING: To normalize gait pattern and improve safety with SPC. 68' with SPC and SBA of PT - repeated verbal and visual cues/demonstration for step through pattern and coordination of SPC with R LE, however patient continues to demonstrate step to pattern  THERAPEUTIC EXERCISE: To improve strength, ROM, and flexibility.  Demonstration, verbal and tactile cues throughout for technique. Supine quad set on towel roll 10 x 5 Supine hip ABD/ADD x 10 Manual HS stretch by PT 2 x 30 Mod thomas quad/hip flexor stretch 2 x 30-60 Attempted R hip ADD butterfly stretch but deferred d/t increased pain  B HS curl/AAROM R knee flexion with heels on peanut ball and strap assist for R LE 2 x 10  MANUAL THERAPY: To promote normalized muscle tension, improved flexibility, improved joint mobility, increased ROM, and reduced pain utilizing joint mobilization, connective tissue massage,  therapeutic massage, and kinesiotaping.  STM and gentle IASTM with foam roller to R quads,  hip adductors and ITB R patellar mobilization all directions - very limited mobility however able to reduce pain with heel slides when PT providing medial patellar glide Kinesiotaping to R knee (sensitive skin tape - light blue Kinesiotex Gold) - chondromalacia pattern with slight increased stretch on lateral strip to promote medial patellar glide  MODALITIES:  Ice pack to R knee x 10' in sitting with LE elevated on 9 stool to reduce post-exercise pain and edema   01/09/24 - Eval SELF CARE:   Reviewed eval findings and role of PT in addressing identified deficits as well as instruction in initial HEP (see below).  Provided instruction in gait training with SPC in L hand to offset R knee.  Patient only able to demonstrate step to pattern despite repeated verbal cues and demonstration of step through pattern.  Further training indicated next visit. Discussed taking her lasix  more frequently (prescribed for daily PRN) to help with LE edema as she notes slight improvement in LE edema and knee pain after she takes the lasix .  Also explained that her potassium supplement should be taken with the lasix  to offset the loss of potassium as a result of the lasix .    PATIENT EDUCATION:  Education details: standardized testing results and interpretation, HEP review, gait safety with SPC, and Ktape wearing and removal instructions  Person educated: Patient Education method: Explanation, Demonstration, Verbal cues, Tactile cues, and Handouts Education comprehension: verbalized understanding, returned demonstration, verbal cues required, tactile cues required, and needs further education  HOME EXERCISE PROGRAM: Access Code: [email or web address] URL: [email or web address] Date: 01/09/2024 Prepared by: Elijah Hidden  Exercises - Supine Quad Set on Towel Roll  - 2-3 x daily - 7 x weekly - 2 sets - 10 reps - 5 sec  hold - Seated Heel Slide  - 2-3 x daily - 7 x weekly - 2 sets - 10 reps - 3 sec hold - Seated Long Arc Quad  - 2-3 x daily - 7 x weekly - 2 sets - 10 reps - 3 sec hold   ASSESSMENT:  CLINICAL IMPRESSION: Deby arrives to PT with her [facility] today but still demonstrating a step to gait pattern despite repeated gait training to correct gait pattern to step through pattern.  She notes there is a 4WW available at home that had belonged to her husband - she may benefit from trial of gait training with RW vs 4WW to help further offload painful R knee.  She continues to report high levels of pain in her R knee and LE with increased muscle tension and guarding evident.  Completed some of standardized testing with 5xSTS, TUG and all indicating high risk for falls.  Deferred DGI due to increased pain and limited ambulation tolerance.  Worked on gentle stretching and ROM exercises to help alleviate muscle tightness and provide joint lubrication to reduce pain.  Patient noting some pain relief with medial patellar glide during ROM exercises therefore concluded visit with application of sensitive skin Kinesiotape to R knee to promote increased medial patellar glide and will assess response when she returns on Friday.  Therasa will benefit from continued skilled PT to address ongoing ROM, flexibility, abnormal muscle tension, strength and balance deficits to improve mobility and activity tolerance with decreased pain interference and decreased risk for falls.    EVAL: Bradee N Barse is a 83 y.o. female who was referred to physical therapy for evaluation and treatment for acute R knee pain secondary to osteoarthritis.  Patient reports onset of R  knee pain beginning in early October without known injury or precipitating event, other than having gone out to dinner with her family the night before.  Pain is worse with any standing or walking, as well as lower body dressing.  Patient has deficits in R knee ROM, B  LE flexibility, B LE strength, abnormal posture, antalgic gait pattern with evidence of gait instability, and TTP with abnormal muscle tension which are interfering with ADLs and are impacting quality of life.  On LEFS patient scored 2/80 demonstrating severe functional limitation.  Further standardized balance testing to be completed next visit.  Zahirah will benefit from skilled PT to address above deficits to improve mobility and activity tolerance with decreased pain interference.  OBJECTIVE IMPAIRMENTS: Abnormal gait, decreased activity tolerance, decreased balance, decreased endurance, decreased knowledge of condition, decreased knowledge of use of DME, decreased mobility, difficulty walking, decreased ROM, decreased strength, decreased safety awareness, increased edema, increased fascial restrictions, impaired perceived functional ability, increased muscle spasms, impaired flexibility, improper body mechanics, postural dysfunction, and pain.   ACTIVITY LIMITATIONS: carrying, lifting, bending, sitting, standing, squatting, sleeping, stairs, transfers, bed mobility, bathing, dressing, and locomotion level  PARTICIPATION LIMITATIONS: meal prep, cleaning, laundry, driving, shopping, community activity, and church  PERSONAL FACTORS: Age, Behavior pattern, Fitness, Past/current experiences, Time since onset of injury/illness/exacerbation, and 3+ comorbidities: Extensive PMH including chronic back and neck pain, OA, osteoporosis, DM-II, HTN, h/o TIA, diabetic peripheral neuropathy, PVD, headaches, sleep dysfunction, anxiety, R breast cancer s/p lumpectomy 07/31/22, SVT  are also affecting patient's functional outcome.   REHAB POTENTIAL: Good  CLINICAL DECISION MAKING: Evolving/moderate complexity  EVALUATION COMPLEXITY: Moderate   GOALS: Goals reviewed with patient? Yes  SHORT TERM GOALS: Target date: 02/20/2024  Patient will be independent with initial HEP. Baseline: HEP initiated on  eval Goal status: IN PROGRESS - 01/16/24 - Limited review today  2.  Patient will report at least 25% improvement in R knee pain to improve QOL. Baseline: 10/10 Goal status: INITIAL  3.  Patient will demonstrate improved R knee AROM to >/= 10-95 to allow for normal gait and stair mechanics. Baseline: Refer to above LE ROM table - R knee AROM 18-82, with 8 Extension lacking when knee supported  Goal status: INITIAL  4.  Complete standardized balance testing and update POC/goals as indicated. Baseline:  Goal status: IN PROGRESS - 01/16/24 - 5xSTS, TUG and completed   LONG TERM GOALS: Target date: 04/02/2024  Patient will be independent with advanced/ongoing HEP to improve outcomes and carryover.  Baseline:  Goal status: INITIAL  2.  Patient will report at least 50% improvement in R knee pain to improve QOL. Baseline: 10/10 Goal status: INITIAL  3.  Patient will demonstrate improved R knee AROM to >/= 5-110 to allow for normal gait and stair mechanics. Baseline: Refer to above LE ROM table - R knee AROM 18-82, with 8 Extension lacking when knee supported  Goal status: INITIAL  4.  Patient will demonstrate improved B LE strength to >/= 4 to 4+/5 for improved stability and ease of mobility. Baseline: Refer to above LE MMT table Goal status: INITIAL  5.  Patient will be able to ambulate 600' with LRAD and normal gait pattern without increased pain to access community.  Baseline: Antalgic step to pattern with SPC on L, worsening antalgic gait pattern without AD Goal status: INITIAL  6. Patient will report >/= 30/80 on LEFS (MCID = 9 pts) to demonstrate improved functional ability. Baseline: 2 / 80 = 2.5 %  Goal status: INITIAL  7.  Patient will demonstrate at least 19/24 on DGI to decrease risk of falls. Baseline: NT on eval, 21/24 as of DC from PT on 07/09/23 Goal status: INITIAL   8.  Patient will improve 5x STS time to </= 25 seconds to demonstrate improved  functional strength and transfer efficiency. Baseline: 47.84 sec - 01/16/24, 22.59 sec as of DC from PT on 07/09/23 Goal status: INITIAL   9.  Patient will demonstrate decreased TUG time to </= 18 sec to decrease risk for falls with transitional mobility. Baseline: 01/16/24 - 44.75 sec with SPC, 13.5 sec w/o AD as of 05/03/23 Goal status: INITIAL  10.  Patient will demonstrate gait speed of >/= 1.81 ft/sec to be a safe limited community ambulator with decreased risk for recurrent falls.  Baseline: 01/16/24 - 0.60 ft/sec with SPC, 2.78 ft/sec w/o AD as of 05/15/23 Goal status: INITIAL     PLAN:  PT FREQUENCY: 2x/week  PT DURATION: 12 weeks  PLANNED INTERVENTIONS: 97164- PT Re-evaluation, 97750- Physical Performance Testing, 97110-Therapeutic exercises, 97530- Therapeutic activity, 97112- Neuromuscular re-education, 97535- Self Care, 02859- Manual therapy, [phone]- Gait training, [phone]- Aquatic Therapy, [phone]- Electrical stimulation (unattended), 97016- Vasopneumatic device, [id number]- Ultrasound, [id number]- Ionotophoresis 4mg /ml Dexamethasone , 79439 (1-2 muscles), 20561 (3+ muscles)- Dry Needling, Patient/Family education, Balance training, Stair training, Taping, Joint mobilization, DME instructions, Cryotherapy, and Moist heat  PLAN FOR NEXT SESSION: Assess response to Kinesiotape; continue gait training with SPC versus trial of RW/4WW; complete standardized balance testing - DGI; review initial HEP; gently progress R knee ROM; LE strengthening with emphasis on quads and hip stability; manual therapy and/or modalities PRN for pain management   Elijah CHRISTELLA Hidden, PT 01/16/2024, 1:44 PM    Date of referral: 12/20/2023 Referring provider: Charles Redell LABOR, DO Referring diagnosis? M17.11 (ICD-10-CM) - Primary osteoarthritis of right knee Treatment diagnosis? (if different than referring diagnosis)  Acute pain of right knee  Stiffness of right knee, not elsewhere classified  Muscle weakness  (generalized)  Other abnormalities of gait and mobility  Unsteadiness on feet  What was this (referring dx) caused by? Arthritis  Nature of Condition: Initial Onset (within last 3 months)   Laterality: Rt  Current Functional Measure Score: LEFS 2 / 80 = 2.5 %  Objective measurements identify impairments when they are compared to normal values, the uninvolved extremity, and prior level of function.  [x]  Yes  []  No  Objective assessment of functional ability: Severe functional limitations   Briefly describe symptoms:  Allisson N Lorson is a 83 y.o. female who was referred to physical therapy for evaluation and treatment for acute R knee pain secondary to osteoarthritis.  Patient reports onset of R knee pain beginning in early October without known injury or precipitating event, other than having gone out to dinner with her family the night before.  Pain is worse with any standing or walking, as well as lower body dressing.  Patient has deficits in R knee ROM, B LE flexibility, B LE strength, abnormal posture, antalgic gait pattern with evidence of gait instability, and TTP with abnormal muscle tension which are interfering with ADLs and are impacting quality of life.  On LEFS patient scored 2/80 demonstrating severe functional limitation.  Further standardized balance testing to be completed next visit.  How did symptoms start: Sudden onset of pain and swelling in her R knee the day after she had been out with family  Average pain intensity:  Last 24 hours: 10/10  Past week: 10/10  How often does the pt experience symptoms? Constantly  How much have the symptoms interfered with usual daily activities? Extremely  How has condition changed since care began at this facility? NA - initial visit  In general, how is the patients overall health? Fair  Onset date: early October   BACK PAIN (STarT Back Screening Tool) - (When applicable): N/A  Has your back pain spread down your  leg(s) at sometime in the last 2 weeks? []  Yes   []  No Have you had pain in the shoulder or neck at sometime in the past 2 weeks? []  Yes   []  No Have you only walked short distances because of your back pain? []  Yes   []  No In the past 2 weeks, have you dressed more slowly than usual because of your back pain? []  Yes   []  No Do you think it is not really safe for person with a condition like yours to be physically active? []  Yes   []  No Have worrying thoughts been going through your mind a lot of the time? []  Yes   []  No Do you feel that your back pain is terrible and it is never going to get any better? []  Yes   []  No In general, have you stopped enjoying all the things you usually enjoy? []  Yes   []  No Overall, how bothersome has your back pain been in the last 2 weeks? []  Not at all   []  Slightly     []  Moderate   []  Very much     []  Extremely

## 2024-01-18 ENCOUNTER — Encounter: Payer: Self-pay | Admitting: Physical Therapy

## 2024-01-18 ENCOUNTER — Ambulatory Visit: Admitting: Physical Therapy

## 2024-01-18 DIAGNOSIS — M25561 Pain in right knee: Secondary | ICD-10-CM | POA: Diagnosis not present

## 2024-01-18 DIAGNOSIS — M6281 Muscle weakness (generalized): Secondary | ICD-10-CM

## 2024-01-18 DIAGNOSIS — R2681 Unsteadiness on feet: Secondary | ICD-10-CM

## 2024-01-18 DIAGNOSIS — R2689 Other abnormalities of gait and mobility: Secondary | ICD-10-CM

## 2024-01-18 DIAGNOSIS — M25661 Stiffness of right knee, not elsewhere classified: Secondary | ICD-10-CM

## 2024-01-18 NOTE — Therapy (Signed)
 OUTPATIENT PHYSICAL THERAPY TREATMENT   Patient Name: [name] MRN: [id number] DOB:08-12-1940, 83 y.o., female Today's Date: 01/18/2024   END OF SESSION:  PT End of Session - 01/18/24 1015     Visit Number 3    Date for Recertification  04/02/24    Authorization Type [employer] Medicare    Authorization Time Period 01/09/24 - 04/02/24    Authorization - Visit Number 3    Authorization - Number of Visits 6    Progress Note Due on Visit 10    PT Start Time 1015    PT Stop Time 1108    PT Time Calculation (min) 53 min    Activity Tolerance Patient tolerated treatment well;Patient limited by pain    Behavior During Therapy [facility] for tasks assessed/performed            Past Medical History:  Diagnosis Date   Abdominal pain in female 03/18/2010   Qualifier: Diagnosis of  By: Avram MD, NOLIA Pitts E    Anemia 06/08/2014   Anxiety    Arthritis    Spinal Osteoarthritis   Breast cancer (HCC)    Carcinoid tumor of stomach (HCC)    Cataract    Chest pain    Myoview  12/15 no ischemia.   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 09/05/2006   Qualifier: Diagnosis of  By: Wilhemina RMA, Lucy     Diabetic peripheral neuropathy (HCC) 10/29/2013   Encounter for preventative adult health care exam with abnormal findings 09/14/2013   Esophageal reflux    Gastric polyp    Fundic Gland   Gastroparesis    Headache(784.0)    Heart murmur    Echocardiogram 2/11: EF 60-65%, mild LAE, grade 1 diastolic dysfunction, aortic valve sclerosis, mean gradient 9 mm of mercury, PASP 34   Hematuria 03/16/2016   Iron  deficiency anemia, unspecified    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   Leg swelling    bilateral   Neck pain 04/22/2015   PONV (postoperative nausea and vomiting)    pt states body temperature drops every time she has anesthesia; pt states only needs small amount of anesthesia   PSVT (paroxysmal  supraventricular tachycardia)    Pure hypercholesterolemia    Recurrent UTI 01/11/2016   Renal insufficiency 06/18/2019   pt states  L kidney function very low-functions at about 20%   Stroke [facility])    tia, 2014   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   Type II or unspecified type diabetes mellitus without mention of complication, not stated as uncontrolled    Unspecified essential hypertension    Unspecified hereditary and idiopathic peripheral neuropathy 10/29/2013   Vitamin D  deficiency    Past Surgical History:  Procedure Laterality Date   BREAST BIOPSY Right 06/13/2022   US  RT BREAST BX W LOC DEV 1ST LESION IMG BX SPEC US  GUIDE 06/13/2022 [facility] MAMMOGRAPHY   BREAST LUMPECTOMY Right 07/31/2022   Procedure: RIGHT BREAST LUMPECTOMY;  Surgeon: Vernetta Berg, MD;  Location: [facility];  Service: General;  Laterality: Right;   CHOLECYSTECTOMY  1993   COLONOSCOPY  11/11/2010   diverticulosis   DILATATION & CURRETTAGE/HYSTEROSCOPY WITH RESECTOCOPE N/A 02/25/2013   Procedure: Attempted hysteroscopy with uterine perforation;  Surgeon: Bobie FORBES Crown de Charlynn FORBES Cary, MD;  Location: [facility];  Service: Gynecology;  Laterality: N/A;   ESOPHAGOGASTRODUODENOSCOPY  08/29/2010; 09/15/2010   Carcinoid tumor less than 1 cm in  July 2012 not seen in August 2012 , gastritis, fundic gland polyps   ESOPHAGOGASTRODUODENOSCOPY  05/16/2011   ESOPHAGOGASTRODUODENOSCOPY  06/14/2012   EUS  12/15/2010   Procedure: UPPER ENDOSCOPIC ULTRASOUND (EUS) LINEAR;  Surgeon: Toribio Cedar, MD;  Location: [facility];  Service: Endoscopy;  Laterality: N/A;   EYE SURGERY Bilateral    Bi lateral cateracts and bi lateral laser   LAPAROSCOPY N/A 02/25/2013   Procedure: Cystoscopy and laparoscopy with fulguration of uterine serosa;  Surgeon: Bobie FORBES Crown de Charlynn FORBES Cary, MD;  Location: [facility];  Service: Gynecology;  Laterality: N/A;   TONSILLECTOMY     Patient Active Problem List   Diagnosis Date Noted   Acute pain of  right knee 11/20/2023   Precordial chest pain 12/24/2022   Carcinoma of breast upper outer quadrant, right (HCC) 07/04/2022   SVT (supraventricular tachycardia) 06/21/2022   Left-sided weakness 04/02/2022   Unsteady gait 04/02/2022   Oral lesion 03/08/2021   Sun-damaged skin 03/08/2021   Thiamine  deficiency 11/01/2020   Trigeminal neuralgia 08/21/2020   Pedal edema 08/21/2020   Chronic left-sided back pain 03/28/2020   Nocturia 03/28/2020   Left-sided headache 03/28/2020   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   Renal insufficiency 06/18/2019   Educated about COVID-19 virus infection 05/15/2019   Atrophic vaginitis 01/20/2019   Stenosis of carotid artery 11/12/2018   Anxiety 10/13/2018   Referred otalgia of left ear 04/23/2018   Chronic throat clearing 04/23/2018   Sinusitis 10/11/2017   Headache 07/12/2017   Dizzy spells 11/21/2016   Constipation 11/21/2016   Nonrheumatic aortic valve stenosis 05/23/2016   Aortic atherosclerosis 05/23/2016   Hematuria 03/16/2016   Recurrent UTI 01/11/2016   Pain of upper abdomen 06/06/2015   Neck pain 04/22/2015   Ear pain 02/28/2015   Abnormal urine 11/21/2014   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   Anemia 06/08/2014   RLS (restless legs syndrome) 11/23/2013   Diabetic peripheral neuropathy (HCC) 10/29/2013   Left-sided thoracic back pain 10/06/2013   Encounter for preventative adult health care exam with abnormal findings 09/14/2013   Iron  deficiency anemia    Status post laparoscopy 02/25/2013   Hyponatremia 01/09/2013   GERD (gastroesophageal reflux disease) 01/09/2013   Amaurosis fugax of left eye 10/16/2012   Low back pain 06/03/2012   Vitamin D  deficiency 03/11/2012   Bilateral hand pain 10/20/2011   Encounter for long-term (current) use of other medications 10/20/2011   IBS (irritable bowel syndrome) 08/14/2011   TIA (transient ischemic attack) 02/10/2011   Abnormal brain CT 01/19/2011   Allergic rhinitis  10/01/2010   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   Preventative health care 07/15/2010   FUNDIC GLAND POLYPS OF STOMACH 03/18/2010   Abdominal pain in female 03/18/2010   Left hip pain 03/17/2009   SYSTOLIC MURMUR 03/02/2009   Paroxysmal supraventricular tachycardia 01/12/2009   PLANTAR FASCIITIS 06/08/2008   CHEST PAIN 05/18/2008   Gastroparesis 12/18/2007   HYPERCHOLESTEROLEMIA 06/11/2007   Type 2 diabetes mellitus with obesity 09/05/2006   Essential hypertension 09/05/2006    PCP: Domenica Harlene LABOR, MD   REFERRING PROVIDER: Charles Redell LABOR, DO  REFERRING DIAG: M17.11 (ICD-10-CM) - Primary osteoarthritis of right knee   THERAPY DIAG:  Acute pain of right knee  Stiffness of right knee, not elsewhere classified  Muscle weakness (generalized)  Other abnormalities of gait and mobility  Unsteadiness on feet  RATIONALE FOR EVALUATION AND TREATMENT: Rehabilitation  ONSET DATE: early October  NEXT MD VISIT: 01/16/2024  SUBJECTIVE:                                                                                                                                                                                                         SUBJECTIVE STATEMENT: Pt reports the Ktape helped but only lasted until the next morning.  Knee does feel better when patella is moved medially.  Pain slightly better today.  EVAL: Pt reports on Sunday in early October she was out for dinner with her family and the next day her knee was swollen and she could barely put weight on it.  She waited about 2 weeks before going to see the doctor.  MD drew some fluid off and gave her a cortisone shot which helped for ~1 week but now the pain is back and she feels crippled. Pain prevents her from walking much or going out.  Difficulty putting on her clothes and shoes.  She also notes a burning feeling in her knee and anterior leg.  She has variable swelling in her knee - better today after taking  lasix  yesterday.  She states she has to use a cane at home but did not bring it with her to PT today.  She states now her R hip and back are also hurting due to limping for her knee.  Pain prevents her from sleeping, esp on her sides, which is her preferred sleeping position.  PAIN: Are you having pain? Yes: NPRS scale: 9/10  Pain location: R knee, lower thigh and upper lower leg  Pain description: sharp  Aggravating factors: walking, prolonged standing, lower body dressing  Relieving factors: Voltaren  gel, Tylenol , ice - nothing give much relief  PERTINENT HISTORY:  Extensive PMH including chronic back and neck pain, OA, osteoporosis, DM-II, HTN, h/o TIA, diabetic peripheral neuropathy, PVD, headaches, sleep dysfunction, anxiety, R breast cancer s/p lumpectomy 07/31/22, SVT (refer to above problem list and past medical/surgical history for full PMH)   PRECAUTIONS: Fall  RED FLAGS: None  WEIGHT BEARING RESTRICTIONS: No  FALLS:  Has patient fallen in last 6 months? No  LIVING ENVIRONMENT: Lives with: lives alone Lives in: House Stairs: No Has following equipment at home: Single point cane, shower chair, and Grab bars  OCCUPATION: Retired  PLOF: Independent, Needs assistance with homemaking, and Leisure: read and play card games on her phone, traveling, and housekeeping/cleaning service ~1x/month   PATIENT GOALS: Just to walk freely with no pain.   OBJECTIVE: (objective measures completed at initial evaluation unless otherwise dated)  DIAGNOSTIC FINDINGS:  11/27/23 - DG RIGHT KNEE -  COMPLETE 4+ VIEW CLINICAL DATA:  Right knee pain. Primary osteoarthritis of right knee. Acute knee pain.   FINDINGS: The alignment and joint spaces are preserved. Mild tricompartmental peripheral spurring and spurring of the tibial spines. Small knee joint effusion. No fracture, erosion, or focal bone abnormality. Frontal view of the left knee included for comparison demonstrates tibiofemoral  osteoarthritis.   IMPRESSION: Mild tricompartmental osteoarthritis of the right knee. Small knee joint effusion.  PATIENT SURVEYS:  LEFS  Extreme difficulty/unable (0), Quite a bit of difficulty (1), Moderate difficulty (2), Little difficulty (3), No difficulty (4) Survey date:  01/09/24   Any of your usual work, housework or school activities 0  2. Usual hobbies, recreational or sporting activities 0  3. Getting into/out of the bath 1  4. Walking between rooms 1  5. Putting on socks/shoes 0  6. Squatting  0  7. Lifting an object, like a bag of groceries from the floor 0  8. Performing light activities around your home 0  9. Performing heavy activities around your home 0  10. Getting into/out of a car 0  11. Walking 2 blocks 0  12. Walking 1 mile 0  13. Going up/down 10 stairs (1 flight) 0  14. Standing for 1 hour 0  15.  sitting for 1 hour 0  16. Running on even ground 0  17. Running on uneven ground 0  18. Making sharp turns while running fast 0  19. Hopping  0  20. Rolling over in bed 0  Score total:  2/80  Functional limitation: Severe     COGNITION: Overall cognitive status: Within functional limits for tasks assessed    SENSATION: WFL Pt reports burning and itching in her knee at times  EDEMA:  Circumferential:   Mid patella: R = 46.0 cm L = 43.5 cm  6 cm above: R = 49.0 cm L = 45.0 cm  6 cm below: R = 43.0 cm L = 40.0 cm  POSTURE:  rounded shoulders, forward head, and weight shift left  PALPATION: Very TTP over patella and anterior knee - increased edema apparent with decreased patellar mobility and poor quad activation (especially VMO) with quad set  MUSCLE LENGTH: TBA next visit Hamstrings:  ITB:  Piriformis:  Hip flexors:  Quads:  Heelcord:   LOWER EXTREMITY ROM:  Active ROM Right eval Left eval  Knee flexion 82 120  Knee extension 18 LAQ /  8 supported 0  (Blank rows = not tested)  LOWER EXTREMITY MMT: (Tested in sitting on  eval)  MMT Right eval Left eval  Hip flexion 3 3+  Hip extension 2 3-  Hip abduction 3+ 4-  Hip adduction 3+ 4-  Hip internal rotation    Hip external rotation    Knee flexion 3+ 4-  Knee extension 3- 4  Ankle dorsiflexion 3+ 4-  Ankle plantarflexion    Ankle inversion    Ankle eversion     (Blank rows = not tested)  FUNCTIONAL TESTS: 01/16/24 5 times sit to stand: 47.84 sec  Timed up and go (TUG): 44.75 sec with SPC  10 meter walk test: 54.47 sec with SPC  Gait speed = 0.60 ft/sec with SPC  GAIT: Distance walked: 50' Assistive device utilized: Single point cane and None Level of assistance: SBA Gait pattern: step to pattern, decreased step length- Left, decreased stride length, decreased hip/knee flexion- Right, antalgic, and lateral lean- Left Comments: Increased instability evident with patient reporting she uses SPC at home but  did not bring it with her to PT   TODAY'S TREATMENT:    01/18/24 THERAPEUTIC EXERCISE: To improve strength, endurance, ROM, and flexibility.  Demonstration, verbal and tactile cues throughout for technique.  NuStep - L3 x 6' (UE/LE) Seated hip ADD isometric ball squeeze 10 x 5 Seated R hip adductor stretch 2 x 30 Seated hip ADD ball squeeze + R LAQ x 10 Seated hip hinge R HS stretch 2 x 30 Seated R knee flexion/extension heel slides on slider 2 x 10 Supine manual L hip adductor stretch 2 x 30 Supine manual L HS stretch 2 x 30 Supine manual L ITB stretch 2 x 30 Supine R quad set into towel roll 10 x 5  MANUAL THERAPY: To promote normalized muscle tension, improved flexibility, improved joint mobility, increased ROM, reduced pain, and reduced edema utilizing joint mobilization, connective tissue massage, therapeutic massage, manual TP therapy, and kinesiotaping. Kinesiotaping to R knee (blue Kinesiotex Classic) - chondromalacia pattern with slight increased stretch on lateral strip to promote medial patellar glide R patellar  mobilization all directions - very limited mobility with some increased pain reported at points of PT pressure STM/DTM and manual TPR to R hip adductors, quads and ITB   01/16/24  THERAPEUTIC ACTIVITIES: To improve functional performance.  Demonstration, verbal and tactile cues throughout for technique. 5xSTS = 47.84 sec TUG = 44.75 sec with SPC = 54.47 sec with SPC Gait speed = 0.60 ft/sec with SPC  GAIT TRAINING: To normalize gait pattern and improve safety with SPC. 56' with SPC and SBA of PT - repeated verbal and visual cues/demonstration for step through pattern and coordination of SPC with R LE, however patient continues to demonstrate step to pattern  THERAPEUTIC EXERCISE: To improve strength, ROM, and flexibility.  Demonstration, verbal and tactile cues throughout for technique. Supine quad set on towel roll 10 x 5 Supine hip ABD/ADD x 10 Manual HS stretch by PT 2 x 30 Mod thomas quad/hip flexor stretch 2 x 30-60 Attempted R hip ADD butterfly stretch but deferred d/t increased pain  B HS curl/AAROM R knee flexion with heels on peanut ball and strap assist for R LE 2 x 10  MANUAL THERAPY: To promote normalized muscle tension, improved flexibility, improved joint mobility, increased ROM, and reduced pain utilizing joint mobilization, connective tissue massage, therapeutic massage, and kinesiotaping.  STM and gentle IASTM with foam roller to R quads, hip adductors and ITB R patellar mobilization all directions - very limited mobility however able to reduce pain with heel slides when PT providing medial patellar glide Kinesiotaping to R knee (sensitive skin tape - light blue Kinesiotex Gold) - chondromalacia pattern with slight increased stretch on lateral strip to promote medial patellar glide  MODALITIES:  Ice pack to R knee x 10' in sitting with LE elevated on 9 stool to reduce post-exercise pain and edema   01/09/24 - Eval SELF CARE:   Reviewed eval findings and  role of PT in addressing identified deficits as well as instruction in initial HEP (see below).  Provided instruction in gait training with SPC in L hand to offset R knee.  Patient only able to demonstrate step to pattern despite repeated verbal cues and demonstration of step through pattern.  Further training indicated next visit. Discussed taking her lasix  more frequently (prescribed for daily PRN) to help with LE edema as she notes slight improvement in LE edema and knee pain after she takes the lasix .  Also explained that her potassium supplement  should be taken with the lasix  to offset the loss of potassium as a result of the lasix .    PATIENT EDUCATION:  Education details: standardized testing results and interpretation, HEP review, gait safety with SPC, and Ktape wearing and removal instructions  Person educated: Patient Education method: Explanation, Demonstration, Verbal cues, Tactile cues, and Handouts Education comprehension: verbalized understanding, returned demonstration, verbal cues required, tactile cues required, and needs further education  HOME EXERCISE PROGRAM: Access Code: [email or web address] URL: [email or web address] Date: 01/18/2024 Prepared by: Elijah Hidden  Exercises - Supine Quad Set on Towel Roll  - 2-3 x daily - 7 x weekly - 2 sets - 10 reps - 5 sec hold - Seated Heel Slide  - 2-3 x daily - 7 x weekly - 2 sets - 10 reps - 3 sec hold - Seated Hip Adduction Isometrics with Ball  - 2 x daily - 7 x weekly - 2 sets - 10 reps - 3-5 sec hold - Seated Long Arc Quad with Hip Adduction  - 2 x daily - 7 x weekly - 2 sets - 10 reps - 3 sec hold - Seated Hip Adductor Stretch  - 2 x daily - 7 x weekly - 3 reps - 30 sec hold - Seated Hamstring Stretch  - 2 x daily - 7 x weekly - 3 reps - 30 sec hold   ASSESSMENT:  CLINICAL IMPRESSION: [name] reports the Ktape seemed to help but only lasted until the next morning.  No skin irritation noted, therefore tape reapplied  today using regular KinesioTex Classic as opposed to the sensitive skin variety and cautioned patient to remove tape if signs of irritation were to appear.  Patient continues to complain of muscle soreness/tightness in thigh musculature however limited tolerance for manual therapy and stretching.  Reviewed initial HEP making minor modifications and adding some gentle stretching.  Arleen will benefit from continued skilled PT to address ongoing ROM, flexibility, abnormal muscle tension, strength and balance deficits to improve mobility and activity tolerance with decreased pain interference and decreased risk for falls.    EVAL: [name] is a 83 y.o. female who was referred to physical therapy for evaluation and treatment for acute R knee pain secondary to osteoarthritis.  Patient reports onset of R knee pain beginning in early October without known injury or precipitating event, other than having gone out to dinner with her family the night before.  Pain is worse with any standing or walking, as well as lower body dressing.  Patient has deficits in R knee ROM, B LE flexibility, B LE strength, abnormal posture, antalgic gait pattern with evidence of gait instability, and TTP with abnormal muscle tension which are interfering with ADLs and are impacting quality of life.  On LEFS patient scored 2/80 demonstrating severe functional limitation.  Further standardized balance testing to be completed next visit.  Loann will benefit from skilled PT to address above deficits to improve mobility and activity tolerance with decreased pain interference.  OBJECTIVE IMPAIRMENTS: Abnormal gait, decreased activity tolerance, decreased balance, decreased endurance, decreased knowledge of condition, decreased knowledge of use of DME, decreased mobility, difficulty walking, decreased ROM, decreased strength, decreased safety awareness, increased edema, increased fascial restrictions, impaired perceived functional  ability, increased muscle spasms, impaired flexibility, improper body mechanics, postural dysfunction, and pain.   ACTIVITY LIMITATIONS: carrying, lifting, bending, sitting, standing, squatting, sleeping, stairs, transfers, bed mobility, bathing, dressing, and locomotion level  PARTICIPATION LIMITATIONS: meal prep, cleaning, laundry, driving, shopping, community activity, and church  PERSONAL FACTORS: Age, Behavior pattern, Fitness, Past/current experiences, Time since onset of injury/illness/exacerbation, and 3+ comorbidities: Extensive PMH including chronic back and neck pain, OA, osteoporosis, DM-II, HTN, h/o TIA, diabetic peripheral neuropathy, PVD, headaches, sleep dysfunction, anxiety, R breast cancer s/p lumpectomy 07/31/22, SVT  are also affecting patient's functional outcome.   REHAB POTENTIAL: Good  CLINICAL DECISION MAKING: Evolving/moderate complexity  EVALUATION COMPLEXITY: Moderate   GOALS: Goals reviewed with patient? Yes  SHORT TERM GOALS: Target date: 02/20/2024  Patient will be independent with initial HEP. Baseline: HEP initiated on eval 01/16/24 - Limited review today Goal status: IN PROGRESS - 01/18/24 - full HEP review with slight modification/update  2.  Patient will report at least 25% improvement in R knee pain to improve QOL. Baseline: 10/10 Goal status: INITIAL  3.  Patient will demonstrate improved R knee AROM to >/= 10-95 to allow for normal gait and stair mechanics. Baseline: Refer to above LE ROM table - R knee AROM 18-82, with 8 Extension lacking when knee supported  Goal status: INITIAL  4.  Complete standardized balance testing and update POC/goals as indicated. Baseline:  Goal status: IN PROGRESS - 01/16/24 - 5xSTS, TUG and completed   LONG TERM GOALS: Target date: 04/02/2024  Patient will be independent with advanced/ongoing HEP to improve outcomes and carryover.  Baseline:  Goal status: INITIAL  2.  Patient will report at least  50% improvement in R knee pain to improve QOL. Baseline: 10/10 Goal status: INITIAL  3.  Patient will demonstrate improved R knee AROM to >/= 5-110 to allow for normal gait and stair mechanics. Baseline: Refer to above LE ROM table - R knee AROM 18-82, with 8 Extension lacking when knee supported  Goal status: INITIAL  4.  Patient will demonstrate improved B LE strength to >/= 4 to 4+/5 for improved stability and ease of mobility. Baseline: Refer to above LE MMT table Goal status: INITIAL  5.  Patient will be able to ambulate 600' with LRAD and normal gait pattern without increased pain to access community.  Baseline: Antalgic step to pattern with SPC on L, worsening antalgic gait pattern without AD Goal status: INITIAL  6. Patient will report >/= 30/80 on LEFS (MCID = 9 pts) to demonstrate improved functional ability. Baseline: 2 / 80 = 2.5 % Goal status: INITIAL  7.  Patient will demonstrate at least 19/24 on DGI to decrease risk of falls. Baseline: NT on eval, 21/24 as of DC from PT on 07/09/23 Goal status: INITIAL   8.  Patient will improve 5x STS time to </= 25 seconds to demonstrate improved functional strength and transfer efficiency. Baseline: 47.84 sec - 01/16/24, 22.59 sec as of DC from PT on 07/09/23 Goal status: INITIAL   9.  Patient will demonstrate decreased TUG time to </= 18 sec to decrease risk for falls with transitional mobility. Baseline: 01/16/24 - 44.75 sec with SPC, 13.5 sec w/o AD as of 05/03/23 Goal status: INITIAL  10.  Patient will demonstrate gait speed of >/= 1.81 ft/sec to be a safe limited community ambulator with decreased risk for recurrent falls.  Baseline: 01/16/24 - 0.60 ft/sec with SPC, 2.78 ft/sec w/o AD as of 05/15/23 Goal status: INITIAL     PLAN:  PT FREQUENCY: 2x/week  PT DURATION: 12 weeks  PLANNED INTERVENTIONS: 97164- PT Re-evaluation, 97750- Physical Performance Testing, 97110-Therapeutic exercises, 97530- Therapeutic activity,  [id number]- Neuromuscular re-education, 97535- Self Care, 02859- Manual therapy, [id number]- Gait training, [phone]- Aquatic Therapy, [phone]- Electrical stimulation (  unattended), 97016- Vasopneumatic device, [id number]- Ultrasound, 02966- Ionotophoresis 4mg /ml Dexamethasone , 20560 (1-2 muscles), 20561 (3+ muscles)- Dry Needling, Patient/Family education, Balance training, Stair training, Taping, Joint mobilization, DME instructions, Cryotherapy, and Moist heat  PLAN FOR NEXT SESSION: Assess response to Kinesiotape; continue gait training with SPC versus trial of RW/4WW; complete standardized balance testing - DGI; gently progress R knee ROM; LE strengthening with emphasis on quads and hip stability; review and update HEP as indicated; manual therapy and/or modalities PRN for pain management; continue Kinesiotape application as benefit noted and no skin irritation present   Elijah CHRISTELLA Hidden, PT 01/18/2024, 11:35 AM     Date of referral: 12/20/2023 Referring provider: Charles Redell LABOR, DO Referring diagnosis? M17.11 (ICD-10-CM) - Primary osteoarthritis of right knee Treatment diagnosis? (if different than referring diagnosis)  Acute pain of right knee  Stiffness of right knee, not elsewhere classified  Muscle weakness (generalized)  Other abnormalities of gait and mobility  Unsteadiness on feet  What was this (referring dx) caused by? Arthritis  Nature of Condition: Initial Onset (within last 3 months)   Laterality: Rt  Current Functional Measure Score: LEFS 2 / 80 = 2.5 %  Objective measurements identify impairments when they are compared to normal values, the uninvolved extremity, and prior level of function.  [x]  Yes  []  No  Objective assessment of functional ability: Severe functional limitations   Briefly describe symptoms:  [name] is a 83 y.o. female who was referred to physical therapy for evaluation and treatment for acute R knee pain secondary to osteoarthritis.  Patient  reports onset of R knee pain beginning in early October without known injury or precipitating event, other than having gone out to dinner with her family the night before.  Pain is worse with any standing or walking, as well as lower body dressing.  Patient has deficits in R knee ROM, B LE flexibility, B LE strength, abnormal posture, antalgic gait pattern with evidence of gait instability, and TTP with abnormal muscle tension which are interfering with ADLs and are impacting quality of life.  On LEFS patient scored 2/80 demonstrating severe functional limitation.  Further standardized balance testing to be completed next visit.  How did symptoms start: Sudden onset of pain and swelling in her R knee the day after she had been out with family  Average pain intensity:  Last 24 hours: 10/10  Past week: 10/10  How often does the pt experience symptoms? Constantly  How much have the symptoms interfered with usual daily activities? Extremely  How has condition changed since care began at this facility? NA - initial visit  In general, how is the patients overall health? Fair  Onset date: early October   BACK PAIN (STarT Back Screening Tool) - (When applicable): N/A  Has your back pain spread down your leg(s) at sometime in the last 2 weeks? []  Yes   []  No Have you had pain in the shoulder or neck at sometime in the past 2 weeks? []  Yes   []  No Have you only walked short distances because of your back pain? []  Yes   []  No In the past 2 weeks, have you dressed more slowly than usual because of your back pain? []  Yes   []  No Do you think it is not really safe for person with a condition like yours to be physically active? []  Yes   []  No Have worrying thoughts been going through your mind a lot of the time? []  Yes   []   No Do you feel that your back pain is terrible and it is never going to get any better? []  Yes   []  No In general, have you stopped enjoying all the things you usually  enjoy? []  Yes   []  No Overall, how bothersome has your back pain been in the last 2 weeks? []  Not at all   []  Slightly     []  Moderate   []  Very much     []  Extremely

## 2024-01-22 ENCOUNTER — Telehealth: Payer: Self-pay

## 2024-01-22 ENCOUNTER — Ambulatory Visit

## 2024-01-22 DIAGNOSIS — M6281 Muscle weakness (generalized): Secondary | ICD-10-CM

## 2024-01-22 DIAGNOSIS — M25561 Pain in right knee: Secondary | ICD-10-CM

## 2024-01-22 DIAGNOSIS — R2689 Other abnormalities of gait and mobility: Secondary | ICD-10-CM

## 2024-01-22 DIAGNOSIS — M25661 Stiffness of right knee, not elsewhere classified: Secondary | ICD-10-CM

## 2024-01-22 NOTE — Therapy (Signed)
 OUTPATIENT PHYSICAL THERAPY TREATMENT   Patient Name: [name] MRN: [id number] DOB:1940-04-23, 83 y.o., female Today's Date: 01/22/2024   END OF SESSION:  PT End of Session - 01/22/24 1415     Visit Number 4    Date for Recertification  04/02/24    Authorization Type [employer]    Authorization Time Period 01/09/24 - 04/02/24    Authorization - Visit Number 4    Authorization - Number of Visits 6    Progress Note Due on Visit 10    PT Start Time 1330   pt late   PT Stop Time 1400    PT Time Calculation (min) 30 min    Activity Tolerance Patient tolerated treatment well;Patient limited by pain    Behavior During Therapy [facility] for tasks assessed/performed             Past Medical History:  Diagnosis Date   Abdominal pain in female 03/18/2010   Qualifier: Diagnosis of  By: Avram MD, NOLIA Pitts E    Anemia 06/08/2014   Anxiety    Arthritis    Spinal Osteoarthritis   Breast cancer (HCC)    Carcinoid tumor of stomach (HCC)    Cataract    Chest pain    Myoview  12/15 no ischemia.   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 09/05/2006   Qualifier: Diagnosis of  By: Wilhemina RMA, Lucy     Diabetic peripheral neuropathy (HCC) 10/29/2013   Encounter for preventative adult health care exam with abnormal findings 09/14/2013   Esophageal reflux    Gastric polyp    Fundic Gland   Gastroparesis    Headache(784.0)    Heart murmur    Echocardiogram 2/11: EF 60-65%, mild LAE, grade 1 diastolic dysfunction, aortic valve sclerosis, mean gradient 9 mm of mercury, PASP 34   Hematuria 03/16/2016   Iron  deficiency anemia, unspecified    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   Leg swelling    bilateral   Neck pain 04/22/2015   PONV (postoperative nausea and vomiting)    pt states body temperature drops every time she has anesthesia; pt states only needs small amount of anesthesia   PSVT  (paroxysmal supraventricular tachycardia)    Pure hypercholesterolemia    Recurrent UTI 01/11/2016   Renal insufficiency 06/18/2019   pt states  L kidney function very low-functions at about 20%   Stroke 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   Type II or unspecified type diabetes mellitus without mention of complication, not stated as uncontrolled    Unspecified essential hypertension    Unspecified hereditary and idiopathic peripheral neuropathy 10/29/2013   Vitamin D  deficiency    Past Surgical History:  Procedure Laterality Date   BREAST BIOPSY Right 06/13/2022   US  RT BREAST BX W LOC DEV 1ST LESION IMG BX SPEC US  GUIDE 06/13/2022 [facility] MAMMOGRAPHY   BREAST LUMPECTOMY Right 07/31/2022   Procedure: RIGHT BREAST LUMPECTOMY;  Surgeon: Vernetta Berg, MD;  Location: [facility];  Service: General;  Laterality: Right;   CHOLECYSTECTOMY  1993   COLONOSCOPY  11/11/2010   diverticulosis   DILATATION & CURRETTAGE/HYSTEROSCOPY WITH RESECTOCOPE N/A 02/25/2013   Procedure: Attempted hysteroscopy with uterine perforation;  Surgeon: Bobie FORBES Crown de Charlynn FORBES Cary, MD;  Location: [facility];  Service: Gynecology;  Laterality: N/A;   ESOPHAGOGASTRODUODENOSCOPY  08/29/2010; 09/15/2010   Carcinoid tumor less  than 1 cm in July 2012 not seen in August 2012 , gastritis, fundic gland polyps   ESOPHAGOGASTRODUODENOSCOPY  05/16/2011   ESOPHAGOGASTRODUODENOSCOPY  06/14/2012   EUS  12/15/2010   Procedure: UPPER ENDOSCOPIC ULTRASOUND (EUS) LINEAR;  Surgeon: Toribio Cedar, MD;  Location: [facility];  Service: Endoscopy;  Laterality: N/A;   EYE SURGERY Bilateral    Bi lateral cateracts and bi lateral laser   LAPAROSCOPY N/A 02/25/2013   Procedure: Cystoscopy and laparoscopy with fulguration of uterine serosa;  Surgeon: Bobie FORBES Crown de Charlynn FORBES Cary, MD;  Location: [facility];  Service: Gynecology;  Laterality: N/A;   TONSILLECTOMY     Patient Active Problem List   Diagnosis Date Noted    Acute pain of right knee 11/20/2023   Precordial chest pain 12/24/2022   Carcinoma of breast upper outer quadrant, right (HCC) 07/04/2022   SVT (supraventricular tachycardia) 06/21/2022   Left-sided weakness 04/02/2022   Unsteady gait 04/02/2022   Oral lesion 03/08/2021   Sun-damaged skin 03/08/2021   Thiamine  deficiency 11/01/2020   Trigeminal neuralgia 08/21/2020   Pedal edema 08/21/2020   Chronic left-sided back pain 03/28/2020   Nocturia 03/28/2020   Left-sided headache 03/28/2020   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   Renal insufficiency 06/18/2019   Educated about COVID-19 virus infection 05/15/2019   Atrophic vaginitis 01/20/2019   Stenosis of carotid artery 11/12/2018   Anxiety 10/13/2018   Referred otalgia of left ear 04/23/2018   Chronic throat clearing 04/23/2018   Sinusitis 10/11/2017   Headache 07/12/2017   Dizzy spells 11/21/2016   Constipation 11/21/2016   Nonrheumatic aortic valve stenosis 05/23/2016   Aortic atherosclerosis 05/23/2016   Hematuria 03/16/2016   Recurrent UTI 01/11/2016   Pain of upper abdomen 06/06/2015   Neck pain 04/22/2015   Ear pain 02/28/2015   Abnormal urine 11/21/2014   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   Anemia 06/08/2014   RLS (restless legs syndrome) 11/23/2013   Diabetic peripheral neuropathy (HCC) 10/29/2013   Left-sided thoracic back pain 10/06/2013   Encounter for preventative adult health care exam with abnormal findings 09/14/2013   Iron  deficiency anemia    Status post laparoscopy 02/25/2013   Hyponatremia 01/09/2013   GERD (gastroesophageal reflux disease) 01/09/2013   Amaurosis fugax of left eye 10/16/2012   Low back pain 06/03/2012   Vitamin D  deficiency 03/11/2012   Bilateral hand pain 10/20/2011   Encounter for long-term (current) use of other medications 10/20/2011   IBS (irritable bowel syndrome) 08/14/2011   TIA (transient ischemic attack) 02/10/2011   Abnormal brain CT 01/19/2011    Allergic rhinitis 10/01/2010   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   Preventative health care 07/15/2010   FUNDIC GLAND POLYPS OF STOMACH 03/18/2010   Abdominal pain in female 03/18/2010   Left hip pain 03/17/2009   SYSTOLIC MURMUR 03/02/2009   Paroxysmal supraventricular tachycardia 01/12/2009   PLANTAR FASCIITIS 06/08/2008   CHEST PAIN 05/18/2008   Gastroparesis 12/18/2007   HYPERCHOLESTEROLEMIA 06/11/2007   Type 2 diabetes mellitus with obesity 09/05/2006   Essential hypertension 09/05/2006    PCP: Domenica Harlene LABOR, MD   REFERRING PROVIDER: Charles Redell LABOR, DO  REFERRING DIAG: M17.11 (ICD-10-CM) - Primary osteoarthritis of right knee   THERAPY DIAG:  Acute pain of right knee  Stiffness of right knee, not elsewhere classified  Muscle weakness (generalized)  Other abnormalities of gait and mobility  RATIONALE FOR EVALUATION AND TREATMENT: Rehabilitation  ONSET DATE: early October  NEXT MD VISIT: 01/16/2024  SUBJECTIVE:                                                                                                                                                                                                         SUBJECTIVE STATEMENT: Pt reports knee pain 9/10, shows a rash on the lower leg today on anterolateral lower leg  EVAL: Pt reports on Sunday in early October she was out for dinner with her family and the next day her knee was swollen and she could barely put weight on it.  She waited about 2 weeks before going to see the doctor.  MD drew some fluid off and gave her a cortisone shot which helped for ~1 week but now the pain is back and she feels crippled. Pain prevents her from walking much or going out.  Difficulty putting on her clothes and shoes.  She also notes a burning feeling in her knee and anterior leg.  She has variable swelling in her knee - better today after taking lasix  yesterday.  She states she has to use a cane at home but did not  bring it with her to PT today.  She states now her R hip and back are also hurting due to limping for her knee.  Pain prevents her from sleeping, esp on her sides, which is her preferred sleeping position.  PAIN: Are you having pain? Yes: NPRS scale: 9/10  Pain location: R knee, lower thigh and upper lower leg  Pain description: sharp  Aggravating factors: walking, prolonged standing, lower body dressing  Relieving factors: Voltaren  gel, Tylenol , ice - nothing give much relief  PERTINENT HISTORY:  Extensive PMH including chronic back and neck pain, OA, osteoporosis, DM-II, HTN, h/o TIA, diabetic peripheral neuropathy, PVD, headaches, sleep dysfunction, anxiety, R breast cancer s/p lumpectomy 07/31/22, SVT (refer to above problem list and past medical/surgical history for full PMH)   PRECAUTIONS: Fall  RED FLAGS: None  WEIGHT BEARING RESTRICTIONS: No  FALLS:  Has patient fallen in last 6 months? No  LIVING ENVIRONMENT: Lives with: lives alone Lives in: House Stairs: No Has following equipment at home: Single point cane, shower chair, and Grab bars  OCCUPATION: Retired  PLOF: Independent, Needs assistance with homemaking, and Leisure: read and play card games on her phone, traveling, and housekeeping/cleaning service ~1x/month   PATIENT GOALS: Just to walk freely with no pain.   OBJECTIVE: (objective measures completed at initial evaluation unless otherwise dated)  DIAGNOSTIC FINDINGS:  11/27/23 - DG RIGHT KNEE - COMPLETE 4+ VIEW CLINICAL DATA:  Right knee pain.  Primary osteoarthritis of right knee. Acute knee pain.   FINDINGS: The alignment and joint spaces are preserved. Mild tricompartmental peripheral spurring and spurring of the tibial spines. Small knee joint effusion. No fracture, erosion, or focal bone abnormality. Frontal view of the left knee included for comparison demonstrates tibiofemoral osteoarthritis.   IMPRESSION: Mild tricompartmental osteoarthritis of  the right knee. Small knee joint effusion.  PATIENT SURVEYS:  LEFS  Extreme difficulty/unable (0), Quite a bit of difficulty (1), Moderate difficulty (2), Little difficulty (3), No difficulty (4) Survey date:  01/09/24   Any of your usual work, housework or school activities 0  2. Usual hobbies, recreational or sporting activities 0  3. Getting into/out of the bath 1  4. Walking between rooms 1  5. Putting on socks/shoes 0  6. Squatting  0  7. Lifting an object, like a bag of groceries from the floor 0  8. Performing light activities around your home 0  9. Performing heavy activities around your home 0  10. Getting into/out of a car 0  11. Walking 2 blocks 0  12. Walking 1 mile 0  13. Going up/down 10 stairs (1 flight) 0  14. Standing for 1 hour 0  15.  sitting for 1 hour 0  16. Running on even ground 0  17. Running on uneven ground 0  18. Making sharp turns while running fast 0  19. Hopping  0  20. Rolling over in bed 0  Score total:  2/80  Functional limitation: Severe     COGNITION: Overall cognitive status: Within functional limits for tasks assessed    SENSATION: WFL Pt reports burning and itching in her knee at times  EDEMA:  Circumferential:   Mid patella: R = 46.0 cm L = 43.5 cm  6 cm above: R = 49.0 cm L = 45.0 cm  6 cm below: R = 43.0 cm L = 40.0 cm  POSTURE:  rounded shoulders, forward head, and weight shift left  PALPATION: Very TTP over patella and anterior knee - increased edema apparent with decreased patellar mobility and poor quad activation (especially VMO) with quad set  MUSCLE LENGTH: TBA next visit Hamstrings:  ITB:  Piriformis:  Hip flexors:  Quads:  Heelcord:   LOWER EXTREMITY ROM:  Active ROM Right eval Left eval  Knee flexion 82 120  Knee extension 18 LAQ /  8 supported 0  (Blank rows = not tested)  LOWER EXTREMITY MMT: (Tested in sitting on eval)  MMT Right eval Left eval  Hip flexion 3 3+  Hip extension 2 3-  Hip  abduction 3+ 4-  Hip adduction 3+ 4-  Hip internal rotation    Hip external rotation    Knee flexion 3+ 4-  Knee extension 3- 4  Ankle dorsiflexion 3+ 4-  Ankle plantarflexion    Ankle inversion    Ankle eversion     (Blank rows = not tested)  FUNCTIONAL TESTS: 01/16/24 5 times sit to stand: 47.84 sec  Timed up and go (TUG): 44.75 sec with SPC  10 meter walk test: 54.47 sec with SPC  Gait speed = 0.60 ft/sec with SPC  GAIT: Distance walked: 50' Assistive device utilized: Single point cane and None Level of assistance: SBA Gait pattern: step to pattern, decreased step length- Left, decreased stride length, decreased hip/knee flexion- Right, antalgic, and lateral lean- Left Comments: Increased instability evident with patient reporting she uses SPC at home but did not bring it with her to PT  TODAY'S TREATMENT:  01/22/24 Nustep L1x70min UE/LE Seated R LE PF GTB X 10 Seated hip ABD X 10 B Seated ball squeeze x 10  Seated ball squeeze + LAQ x 10 Seated R hamstring stretch 2x30 sec Seated R adductor stretch 2x30 sec Seated R foot on 9' stool piriformis stretch 2x30 sec   01/18/24 THERAPEUTIC EXERCISE: To improve strength, endurance, ROM, and flexibility.  Demonstration, verbal and tactile cues throughout for technique.  NuStep - L3 x 6' (UE/LE) Seated hip ADD isometric ball squeeze 10 x 5 Seated R hip adductor stretch 2 x 30 Seated hip ADD ball squeeze + R LAQ x 10 Seated hip hinge R HS stretch 2 x 30 Seated R knee flexion/extension heel slides on slider 2 x 10 Supine manual L hip adductor stretch 2 x 30 Supine manual L HS stretch 2 x 30 Supine manual L ITB stretch 2 x 30 Supine R quad set into towel roll 10 x 5  MANUAL THERAPY: To promote normalized muscle tension, improved flexibility, improved joint mobility, increased ROM, reduced pain, and reduced edema utilizing joint mobilization, connective tissue massage, therapeutic massage, manual TP therapy, and  kinesiotaping. Kinesiotaping to R knee (blue Kinesiotex Classic) - chondromalacia pattern with slight increased stretch on lateral strip to promote medial patellar glide R patellar mobilization all directions - very limited mobility with some increased pain reported at points of PT pressure STM/DTM and manual TPR to R hip adductors, quads and ITB   01/16/24  THERAPEUTIC ACTIVITIES: To improve functional performance.  Demonstration, verbal and tactile cues throughout for technique. 5xSTS = 47.84 sec TUG = 44.75 sec with SPC = 54.47 sec with SPC Gait speed = 0.60 ft/sec with SPC  GAIT TRAINING: To normalize gait pattern and improve safety with SPC. 63' with SPC and SBA of PT - repeated verbal and visual cues/demonstration for step through pattern and coordination of SPC with R LE, however patient continues to demonstrate step to pattern  THERAPEUTIC EXERCISE: To improve strength, ROM, and flexibility.  Demonstration, verbal and tactile cues throughout for technique. Supine quad set on towel roll 10 x 5 Supine hip ABD/ADD x 10 Manual HS stretch by PT 2 x 30 Mod thomas quad/hip flexor stretch 2 x 30-60 Attempted R hip ADD butterfly stretch but deferred d/t increased pain  B HS curl/AAROM R knee flexion with heels on peanut ball and strap assist for R LE 2 x 10  MANUAL THERAPY: To promote normalized muscle tension, improved flexibility, improved joint mobility, increased ROM, and reduced pain utilizing joint mobilization, connective tissue massage, therapeutic massage, and kinesiotaping.  STM and gentle IASTM with foam roller to R quads, hip adductors and ITB R patellar mobilization all directions - very limited mobility however able to reduce pain with heel slides when PT providing medial patellar glide Kinesiotaping to R knee (sensitive skin tape - light blue Kinesiotex Gold) - chondromalacia pattern with slight increased stretch on lateral strip to promote medial patellar  glide  MODALITIES:  Ice pack to R knee x 10' in sitting with LE elevated on 9 stool to reduce post-exercise pain and edema   01/09/24 - Eval SELF CARE:   Reviewed eval findings and role of PT in addressing identified deficits as well as instruction in initial HEP (see below).  Provided instruction in gait training with SPC in L hand to offset R knee.  Patient only able to demonstrate step to pattern despite repeated verbal cues and demonstration of step through pattern.  Further  training indicated next visit. Discussed taking her lasix  more frequently (prescribed for daily PRN) to help with LE edema as she notes slight improvement in LE edema and knee pain after she takes the lasix .  Also explained that her potassium supplement should be taken with the lasix  to offset the loss of potassium as a result of the lasix .    PATIENT EDUCATION:  Education details: standardized testing results and interpretation, HEP review, gait safety with SPC, and Ktape wearing and removal instructions  Person educated: Patient Education method: Explanation, Demonstration, Verbal cues, Tactile cues, and Handouts Education comprehension: verbalized understanding, returned demonstration, verbal cues required, tactile cues required, and needs further education  HOME EXERCISE PROGRAM: Access Code: [email or web address] URL: [email or web address] Date: 01/18/2024 Prepared by: Elijah Hidden  Exercises - Supine Quad Set on Towel Roll  - 2-3 x daily - 7 x weekly - 2 sets - 10 reps - 5 sec hold - Seated Heel Slide  - 2-3 x daily - 7 x weekly - 2 sets - 10 reps - 3 sec hold - Seated Hip Adduction Isometrics with Ball  - 2 x daily - 7 x weekly - 2 sets - 10 reps - 3-5 sec hold - Seated Long Arc Quad with Hip Adduction  - 2 x daily - 7 x weekly - 2 sets - 10 reps - 3 sec hold - Seated Hip Adductor Stretch  - 2 x daily - 7 x weekly - 3 reps - 30 sec hold - Seated Hamstring Stretch  - 2 x daily - 7 x weekly - 3 reps - 30  sec hold   ASSESSMENT:  CLINICAL IMPRESSION: Pt arrives with what looks like a rash on anterolateral lower leg, she reports it has been over the past few days, doesn't know where it came from, will continue to monitor. She arrived 15 min late today. We continued with stretches and strengthening as able, she was very pain focused throughout the session.  Katharina will benefit from continued skilled PT to address ongoing ROM, flexibility, abnormal muscle tension, strength and balance deficits to improve mobility and activity tolerance with decreased pain interference and decreased risk for falls.    EVAL: Jeena N Valladares is a 83 y.o. female who was referred to physical therapy for evaluation and treatment for acute R knee pain secondary to osteoarthritis.  Patient reports onset of R knee pain beginning in early October without known injury or precipitating event, other than having gone out to dinner with her family the night before.  Pain is worse with any standing or walking, as well as lower body dressing.  Patient has deficits in R knee ROM, B LE flexibility, B LE strength, abnormal posture, antalgic gait pattern with evidence of gait instability, and TTP with abnormal muscle tension which are interfering with ADLs and are impacting quality of life.  On LEFS patient scored 2/80 demonstrating severe functional limitation.  Further standardized balance testing to be completed next visit.  Varshini will benefit from skilled PT to address above deficits to improve mobility and activity tolerance with decreased pain interference.  OBJECTIVE IMPAIRMENTS: Abnormal gait, decreased activity tolerance, decreased balance, decreased endurance, decreased knowledge of condition, decreased knowledge of use of DME, decreased mobility, difficulty walking, decreased ROM, decreased strength, decreased safety awareness, increased edema, increased fascial restrictions, impaired perceived functional ability, increased  muscle spasms, impaired flexibility, improper body mechanics, postural dysfunction, and pain.   ACTIVITY LIMITATIONS: carrying, lifting, bending, sitting, standing, squatting, sleeping, stairs, transfers, bed mobility, bathing,  dressing, and locomotion level  PARTICIPATION LIMITATIONS: meal prep, cleaning, laundry, driving, shopping, community activity, and church  PERSONAL FACTORS: Age, Behavior pattern, Fitness, Past/current experiences, Time since onset of injury/illness/exacerbation, and 3+ comorbidities: Extensive PMH including chronic back and neck pain, OA, osteoporosis, DM-II, HTN, h/o TIA, diabetic peripheral neuropathy, PVD, headaches, sleep dysfunction, anxiety, R breast cancer s/p lumpectomy 07/31/22, SVT  are also affecting patient's functional outcome.   REHAB POTENTIAL: Good  CLINICAL DECISION MAKING: Evolving/moderate complexity  EVALUATION COMPLEXITY: Moderate   GOALS: Goals reviewed with patient? Yes  SHORT TERM GOALS: Target date: 02/20/2024  Patient will be independent with initial HEP. Baseline: HEP initiated on eval 01/16/24 - Limited review today Goal status: IN PROGRESS - 01/18/24 - full HEP review with slight modification/update  2.  Patient will report at least 25% improvement in R knee pain to improve QOL. Baseline: 10/10 Goal status: INITIAL  3.  Patient will demonstrate improved R knee AROM to >/= 10-95 to allow for normal gait and stair mechanics. Baseline: Refer to above LE ROM table - R knee AROM 18-82, with 8 Extension lacking when knee supported  Goal status: INITIAL  4.  Complete standardized balance testing and update POC/goals as indicated. Baseline:  Goal status: IN PROGRESS - 01/16/24 - 5xSTS, TUG and completed   LONG TERM GOALS: Target date: 04/02/2024  Patient will be independent with advanced/ongoing HEP to improve outcomes and carryover.  Baseline:  Goal status: INITIAL  2.  Patient will report at least 50% improvement in R  knee pain to improve QOL. Baseline: 10/10 Goal status: INITIAL  3.  Patient will demonstrate improved R knee AROM to >/= 5-110 to allow for normal gait and stair mechanics. Baseline: Refer to above LE ROM table - R knee AROM 18-82, with 8 Extension lacking when knee supported  Goal status: INITIAL  4.  Patient will demonstrate improved B LE strength to >/= 4 to 4+/5 for improved stability and ease of mobility. Baseline: Refer to above LE MMT table Goal status: INITIAL  5.  Patient will be able to ambulate 600' with LRAD and normal gait pattern without increased pain to access community.  Baseline: Antalgic step to pattern with SPC on L, worsening antalgic gait pattern without AD Goal status: INITIAL  6. Patient will report >/= 30/80 on LEFS (MCID = 9 pts) to demonstrate improved functional ability. Baseline: 2 / 80 = 2.5 % Goal status: INITIAL  7.  Patient will demonstrate at least 19/24 on DGI to decrease risk of falls. Baseline: NT on eval, 21/24 as of DC from PT on 07/09/23 Goal status: INITIAL   8.  Patient will improve 5x STS time to </= 25 seconds to demonstrate improved functional strength and transfer efficiency. Baseline: 47.84 sec - 01/16/24, 22.59 sec as of DC from PT on 07/09/23 Goal status: INITIAL   9.  Patient will demonstrate decreased TUG time to </= 18 sec to decrease risk for falls with transitional mobility. Baseline: 01/16/24 - 44.75 sec with SPC, 13.5 sec w/o AD as of 05/03/23 Goal status: INITIAL  10.  Patient will demonstrate gait speed of >/= 1.81 ft/sec to be a safe limited community ambulator with decreased risk for recurrent falls.  Baseline: 01/16/24 - 0.60 ft/sec with SPC, 2.78 ft/sec w/o AD as of 05/15/23 Goal status: INITIAL     PLAN:  PT FREQUENCY: 2x/week  PT DURATION: 12 weeks  PLANNED INTERVENTIONS: 97164- PT Re-evaluation, 97750- Physical Performance Testing, 97110-Therapeutic exercises, 97530- Therapeutic activity, 97112-  Neuromuscular  re-education, [phone]- Self Care, 02859- Manual therapy, [phone]- Gait training, [phone]- Aquatic Therapy, [phone]- Electrical stimulation (unattended), [phone]- Vasopneumatic device, [id number]- Ultrasound, [id number]- Ionotophoresis 4mg /ml Dexamethasone , 20560 (1-2 muscles), 20561 (3+ muscles)- Dry Needling, Patient/Family education, Balance training, Stair training, Taping, Joint mobilization, DME instructions, Cryotherapy, and Moist heat  PLAN FOR NEXT SESSION: Assess response to Kinesiotape; continue gait training with SPC versus trial of RW/4WW; complete standardized balance testing - DGI; gently progress R knee ROM; LE strengthening with emphasis on quads and hip stability; review and update HEP as indicated; manual therapy and/or modalities PRN for pain management; continue Kinesiotape application as benefit noted and no skin irritation present   Sol LITTIE Gaskins, PTA 01/22/2024, 3:57 PM     Date of referral: 12/20/2023 Referring provider: Charles Redell LABOR, DO Referring diagnosis? M17.11 (ICD-10-CM) - Primary osteoarthritis of right knee Treatment diagnosis? (if different than referring diagnosis)  Acute pain of right knee  Stiffness of right knee, not elsewhere classified  Muscle weakness (generalized)  Other abnormalities of gait and mobility  What was this (referring dx) caused by? Arthritis  Nature of Condition: Initial Onset (within last 3 months)   Laterality: Rt  Current Functional Measure Score: LEFS 2 / 80 = 2.5 %  Objective measurements identify impairments when they are compared to normal values, the uninvolved extremity, and prior level of function.  [x]  Yes  []  No  Objective assessment of functional ability: Severe functional limitations   Briefly describe symptoms:  Shenita N Jallow is a 83 y.o. female who was referred to physical therapy for evaluation and treatment for acute R knee pain secondary to osteoarthritis.  Patient reports onset of R knee pain beginning in early  October without known injury or precipitating event, other than having gone out to dinner with her family the night before.  Pain is worse with any standing or walking, as well as lower body dressing.  Patient has deficits in R knee ROM, B LE flexibility, B LE strength, abnormal posture, antalgic gait pattern with evidence of gait instability, and TTP with abnormal muscle tension which are interfering with ADLs and are impacting quality of life.  On LEFS patient scored 2/80 demonstrating severe functional limitation.  Further standardized balance testing to be completed next visit.  How did symptoms start: Sudden onset of pain and swelling in her R knee the day after she had been out with family  Average pain intensity:  Last 24 hours: 10/10  Past week: 10/10  How often does the pt experience symptoms? Constantly  How much have the symptoms interfered with usual daily activities? Extremely  How has condition changed since care began at this facility? NA - initial visit  In general, how is the patients overall health? Fair  Onset date: early October   BACK PAIN (STarT Back Screening Tool) - (When applicable): N/A  Has your back pain spread down your leg(s) at sometime in the last 2 weeks? []  Yes   []  No Have you had pain in the shoulder or neck at sometime in the past 2 weeks? []  Yes   []  No Have you only walked short distances because of your back pain? []  Yes   []  No In the past 2 weeks, have you dressed more slowly than usual because of your back pain? []  Yes   []  No Do you think it is not really safe for person with a condition like yours to be physically active? []  Yes   []  No Have worrying  thoughts been going through your mind a lot of the time? []  Yes   []  No Do you feel that your back pain is terrible and it is never going to get any better? []  Yes   []  No In general, have you stopped enjoying all the things you usually enjoy? []  Yes   []  No Overall, how bothersome has  your back pain been in the last 2 weeks? []  Not at all   []  Slightly     []  Moderate   []  Very much     []  Extremely

## 2024-01-22 NOTE — Telephone Encounter (Signed)
 Patient walked in requested to see Dr. Domenica or me. Spoke with patient in a room and she had a concerned about a rash and requested to be placed on [employer] [name] schedule she only wanted an appointment on the days of her PT. She was scheduled on 01/29/24 @ 2:20 PM to see Dallas Maxwell, [place] for her rash on lower right leg.   Also patient asked if she needs to stop taking her Potassium medication because she read on google that it causes higher blood sugar readings. She stated that Dr. Maude advised her potassium level was a little elevated and to take potassium every other day but she thinks it needs to be stopped and want to know Dr. Elisabeth opinion. Please advise.

## 2024-01-24 ENCOUNTER — Ambulatory Visit

## 2024-01-24 NOTE — Telephone Encounter (Signed)
 Patient was advised and stated that rash is better now. She wants to know what she can do for her blood sugars staying in the 150's-160's every morning before eating. She wants to know would the elevated potassium levels cause her blood sugar to be elevated?

## 2024-01-25 NOTE — Telephone Encounter (Signed)
 Patient was advised  and states that she will wait until her appointment in January,2026 to see if she needs to make a change. She declined increasing Metformin .

## 2024-01-29 ENCOUNTER — Other Ambulatory Visit: Payer: Self-pay

## 2024-01-29 ENCOUNTER — Ambulatory Visit: Admitting: Medical

## 2024-01-29 ENCOUNTER — Ambulatory Visit

## 2024-01-29 DIAGNOSIS — M25661 Stiffness of right knee, not elsewhere classified: Secondary | ICD-10-CM

## 2024-01-29 DIAGNOSIS — M6281 Muscle weakness (generalized): Secondary | ICD-10-CM

## 2024-01-29 DIAGNOSIS — M25561 Pain in right knee: Secondary | ICD-10-CM

## 2024-01-29 DIAGNOSIS — R2681 Unsteadiness on feet: Secondary | ICD-10-CM

## 2024-01-29 DIAGNOSIS — R2689 Other abnormalities of gait and mobility: Secondary | ICD-10-CM

## 2024-01-29 NOTE — Therapy (Signed)
 +++++++++++++++++++++++++++++++++++++++++++++++++++++++++++++++++++++++ OUTPATIENT PHYSICAL THERAPY TREATMENT   Patient Name: [name] MRN: [id number] DOB:1940/08/05, 83 y.o., female Today's Date: 01/29/2024   END OF SESSION:  PT End of Session - 01/29/24 1317     Visit Number 5    Date for Recertification  04/02/24    Authorization Type [employer] Medicare    Authorization Time Period 01/09/24 - 04/02/24    Progress Note Due on Visit 10    PT Start Time 1317    PT Stop Time 1400    PT Time Calculation (min) 43 min    Activity Tolerance Patient tolerated treatment well;Patient limited by pain    Behavior During Therapy [facility] for tasks assessed/performed              Past Medical History:  Diagnosis Date   Abdominal pain in female 03/18/2010   Qualifier: Diagnosis of  By: Avram MD, NOLIA Pitts E    Anemia 06/08/2014   Anxiety    Arthritis    Spinal Osteoarthritis   Breast cancer (HCC)    Carcinoid tumor of stomach (HCC)    Cataract    Chest pain    Myoview  12/15 no ischemia.   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 09/05/2006   Qualifier: Diagnosis of  By: Wilhemina RMA, Lucy     Diabetic peripheral neuropathy (HCC) 10/29/2013   Encounter for preventative adult health care exam with abnormal findings 09/14/2013   Esophageal reflux    Gastric polyp    Fundic Gland   Gastroparesis    Headache(784.0)    Heart murmur    Echocardiogram 2/11: EF 60-65%, mild LAE, grade 1 diastolic dysfunction, aortic valve sclerosis, mean gradient 9 mm of mercury, PASP 34   Hematuria 03/16/2016   Iron  deficiency anemia, unspecified    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   Leg swelling    bilateral   Neck pain 04/22/2015   PONV (postoperative nausea and vomiting)    pt states body temperature drops every time she has anesthesia; pt states only needs small amount of anesthesia   PSVT (paroxysmal  supraventricular tachycardia)    Pure hypercholesterolemia    Recurrent UTI 01/11/2016   Renal insufficiency 06/18/2019   pt states  L kidney function very low-functions at about 20%   Stroke 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   Type II or unspecified type diabetes mellitus without mention of complication, not stated as uncontrolled    Unspecified essential hypertension    Unspecified hereditary and idiopathic peripheral neuropathy 10/29/2013   Vitamin D  deficiency    Past Surgical History:  Procedure Laterality Date   BREAST BIOPSY Right 06/13/2022   US  RT BREAST BX W LOC DEV 1ST LESION IMG BX SPEC US  GUIDE 06/13/2022 [facility] MAMMOGRAP[facility]   BREAST LUMPECTOMY Right 07/31/2022   Procedure: RIGHT BREAST LUMPECTOMY;  Surgeon: Vernetta Berg, MD;  Location: [facility];  Service: General;  Laterality: Right;   CHOLECYSTECTOMY  1993   COLONOSCOPY  11/11/2010   diverticulosis   DILATATION & CURRETTAGE/HYSTEROSCOPY WITH RESECTOCOPE N/A 02/25/2013   Procedure: Attempted hysteroscopy with uterine perforation;  Surgeon: Bobie FORBES Crown de Charlynn FORBES Cary, MD;  Location: [facility];  Service: Gynecology;  Laterality: N/A;   ESOPHAGOGASTRODUODENOSCOPY  08/29/2010; 09/15/2010   Carcinoid tumor less than 1 cm in July 2012 not seen in August 2012 , gastritis, fundic gland polyps  ESOPHAGOGASTRODUODENOSCOPY  05/16/2011   ESOPHAGOGASTRODUODENOSCOPY  06/14/2012   EUS  12/15/2010   Procedure: UPPER ENDOSCOPIC ULTRASOUND (EUS) LINEAR;  Surgeon: Toribio Cedar, MD;  Location: [facility];  Service: Endoscopy;  Laterality: N/A;   EYE SURGERY Bilateral    Bi lateral cateracts and bi lateral laser   LAPAROSCOPY N/A 02/25/2013   Procedure: Cystoscopy and laparoscopy with fulguration of uterine serosa;  Surgeon: Bobie FORBES Crown de Charlynn FORBES Cary, MD;  Location: [facility];  Service: Gynecology;  Laterality: N/A;   TONSILLECTOMY     Patient Active Problem List   Diagnosis Date Noted   Acute pain of  right knee 11/20/2023   Precordial chest pain 12/24/2022   Carcinoma of breast upper outer quadrant, right (HCC) 07/04/2022   SVT (supraventricular tachycardia) 06/21/2022   Left-sided weakness 04/02/2022   Unsteady gait 04/02/2022   Oral lesion 03/08/2021   Sun-damaged skin 03/08/2021   Thiamine  deficiency 11/01/2020   Trigeminal neuralgia 08/21/2020   Pedal edema 08/21/2020   Chronic left-sided back pain 03/28/2020   Nocturia 03/28/2020   Left-sided headache 03/28/2020   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   Renal insufficiency 06/18/2019   Educated about COVID-19 virus infection 05/15/2019   Atrophic vaginitis 01/20/2019   Stenosis of carotid artery 11/12/2018   Anxiety 10/13/2018   Referred otalgia of left ear 04/23/2018   Chronic throat clearing 04/23/2018   Sinusitis 10/11/2017   Headache 07/12/2017   Dizzy spells 11/21/2016   Constipation 11/21/2016   Nonrheumatic aortic valve stenosis 05/23/2016   Aortic atherosclerosis 05/23/2016   Hematuria 03/16/2016   Recurrent UTI 01/11/2016   Pain of upper abdomen 06/06/2015   Neck pain 04/22/2015   Ear pain 02/28/2015   Abnormal urine 11/21/2014   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   Anemia 06/08/2014   RLS (restless legs syndrome) 11/23/2013   Diabetic peripheral neuropathy (HCC) 10/29/2013   Left-sided thoracic back pain 10/06/2013   Encounter for preventative adult health care exam with abnormal findings 09/14/2013   Iron  deficiency anemia    Status post laparoscopy 02/25/2013   Hyponatremia 01/09/2013   GERD (gastroesophageal reflux disease) 01/09/2013   Amaurosis fugax of left eye 10/16/2012   Low back pain 06/03/2012   Vitamin D  deficiency 03/11/2012   Bilateral hand pain 10/20/2011   Encounter for long-term (current) use of other medications 10/20/2011   IBS (irritable bowel syndrome) 08/14/2011   TIA (transient ischemic attack) 02/10/2011   Abnormal brain CT 01/19/2011   Allergic rhinitis  10/01/2010   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   Preventative health care 07/15/2010   FUNDIC GLAND POLYPS OF STOMACH 03/18/2010   Abdominal pain in female 03/18/2010   Left hip pain 03/17/2009   SYSTOLIC MURMUR 03/02/2009   Paroxysmal supraventricular tachycardia 01/12/2009   PLANTAR FASCIITIS 06/08/2008   CHEST PAIN 05/18/2008   Gastroparesis 12/18/2007   HYPERCHOLESTEROLEMIA 06/11/2007   Type 2 diabetes mellitus with obesity 09/05/2006   Essential hypertension 09/05/2006    PCP: Domenica Harlene LABOR, MD   REFERRING PROVIDER: Charles Redell LABOR, DO  REFERRING DIAG: M17.11 (ICD-10-CM) - Primary osteoarthritis of right knee   THERAPY DIAG:  Acute pain of right knee  Muscle weakness (generalized)  Stiffness of right knee, not elsewhere classified  Other abnormalities of gait and mobility  Unsteadiness on feet  RATIONALE FOR EVALUATION AND TREATMENT: Rehabilitation  ONSET DATE: early October  NEXT MD VISIT: 01/16/2024   SUBJECTIVE:  SUBJECTIVE STATEMENT: Pt reports knee pain still pretty high, burning R lat knee and up into lat thigh.  EVAL: Pt reports on Sunday in early October she was out for dinner with her family and the next day her knee was swollen and she could barely put weight on it.  She waited about 2 weeks before going to see the doctor.  MD drew some fluid off and gave her a cortisone shot which helped for ~1 week but now the pain is back and she feels crippled. Pain prevents her from walking much or going out.  Difficulty putting on her clothes and shoes.  She also notes a burning feeling in her knee and anterior leg.  She has variable swelling in her knee - better today after taking lasix  yesterday.  She states she has to use a cane at home but did not  bring it with her to PT today.  She states now her R hip and back are also hurting due to limping for her knee.  Pain prevents her from sleeping, esp on her sides, which is her preferred sleeping position.  PAIN: Are you having pain? Yes: NPRS scale: 9/10  Pain location: R knee, lower thigh and upper lower leg  Pain description: sharp  Aggravating factors: walking, prolonged standing, lower body dressing  Relieving factors: Voltaren  gel, Tylenol , ice - nothing give much relief  PERTINENT HISTORY:  Extensive PMH including chronic back and neck pain, OA, osteoporosis, DM-II, HTN, h/o TIA, diabetic peripheral neuropathy, PVD, headaches, sleep dysfunction, anxiety, R breast cancer s/p lumpectomy 07/31/22, SVT (refer to above problem list and past medical/surgical history for full PMH)   PRECAUTIONS: Fall  RED FLAGS: None  WEIGHT BEARING RESTRICTIONS: No  FALLS:  Has patient fallen in last 6 months? No  LIVING ENVIRONMENT: Lives with: lives alone Lives in: House Stairs: No Has following equipment at home: Single point cane, shower chair, and Grab bars  OCCUPATION: Retired  PLOF: Independent, Needs assistance with homemaking, and Leisure: read and play card games on her phone, traveling, and housekeeping/cleaning service ~1x/month   PATIENT GOALS: Just to walk freely with no pain.   OBJECTIVE: (objective measures completed at initial evaluation unless otherwise dated)  DIAGNOSTIC FINDINGS:  11/27/23 - DG RIGHT KNEE - COMPLETE 4+ VIEW CLINICAL DATA:  Right knee pain. Primary osteoarthritis of right knee. Acute knee pain.   FINDINGS: The alignment and joint spaces are preserved. Mild tricompartmental peripheral spurring and spurring of the tibial spines. Small knee joint effusion. No fracture, erosion, or focal bone abnormality. Frontal view of the left knee included for comparison demonstrates tibiofemoral osteoarthritis.   IMPRESSION: Mild tricompartmental osteoarthritis of  the right knee. Small knee joint effusion.  PATIENT SURVEYS:  LEFS  Extreme difficulty/unable (0), Quite a bit of difficulty (1), Moderate difficulty (2), Little difficulty (3), No difficulty (4) Survey date:  01/09/24   Any of your usual work, housework or school activities 0  2. Usual hobbies, recreational or sporting activities 0  3. Getting into/out of the bath 1  4. Walking between rooms 1  5. Putting on socks/shoes 0  6. Squatting  0  7. Lifting an object, like a bag of groceries from the floor 0  8. Performing light activities around your home 0  9. Performing heavy activities around your home 0  10. Getting into/out of a car 0  11. Walking 2 blocks 0  12. Walking 1 mile 0  13. Going up/down 10 stairs (1 flight) 0  14. Standing for 1 hour 0  15.  sitting for 1 hour 0  16. Running on even ground 0  17. Running on uneven ground 0  18. Making sharp turns while running fast 0  19. Hopping  0  20. Rolling over in bed 0  Score total:  2/80  Functional limitation: Severe     COGNITION: Overall cognitive status: Within functional limits for tasks assessed    SENSATION: WFL Pt reports burning and itching in her knee at times  EDEMA:  Circumferential:   Mid patella: R = 46.0 cm L = 43.5 cm  6 cm above: R = 49.0 cm L = 45.0 cm  6 cm below: R = 43.0 cm L = 40.0 cm  POSTURE:  rounded shoulders, forward head, and weight shift left  PALPATION: Very TTP over patella and anterior knee - increased edema apparent with decreased patellar mobility and poor quad activation (especially VMO) with quad set  MUSCLE LENGTH: TBA next visit Hamstrings:  ITB:  Piriformis:  Hip flexors:  Quads:  Heelcord:   LOWER EXTREMITY ROM:  Active ROM Right eval Left eval  Knee flexion 82 120  Knee extension 18 LAQ /  8 supported 0  (Blank rows = not tested)  LOWER EXTREMITY MMT: (Tested in sitting on eval)  MMT Right eval Left eval  Hip flexion 3 3+  Hip extension 2 3-  Hip  abduction 3+ 4-  Hip adduction 3+ 4-  Hip internal rotation    Hip external rotation    Knee flexion 3+ 4-  Knee extension 3- 4  Ankle dorsiflexion 3+ 4-  Ankle plantarflexion    Ankle inversion    Ankle eversion     (Blank rows = not tested)  FUNCTIONAL TESTS: 01/16/24 5 times sit to stand: 47.84 sec  Timed up and go (TUG): 44.75 sec with SPC  10 meter walk test: 54.47 sec with SPC  Gait speed = 0.60 ft/sec with SPC  GAIT: Distance walked: 50' Assistive device utilized: Single point cane and None Level of assistance: SBA Gait pattern: step to pattern, decreased step length- Left, decreased stride length, decreased hip/knee flexion- Right, antalgic, and lateral lean- Left Comments: Increased instability evident with patient reporting she uses SPC at home but did not bring it with her to PT   TODAY'S TREATMENT:  01/29/24: Supine for AP jt mobs L prox tibia with thigh over bolster, 2 bouts 45 sec each, gentle, to improve R knee flexion motion  Mob with movement, lat glides R hip while assisting with R hip IR/ER and flex Side lying L with R thigh over 6 roll, for theragun massage R lateral hip musculature Supine with bolster under knees , manually resisted R hip IR, and then manually resisted R hip IR , 15 reps each  Ankle pumps with theraband for R ankle plantarflexion motion.  01/22/24 Nustep L1x58min UE/LE Seated R LE PF GTB X 10 Seated hip ABD X 10 B Seated ball squeeze x 10  Seated ball squeeze + LAQ x 10 Seated R hamstring stretch 2x30 sec Seated R adductor stretch 2x30 sec Seated R foot on 9' stool piriformis stretch 2x30 sec   01/18/24 THERAPEUTIC EXERCISE: To improve strength, endurance, ROM, and flexibility.  Demonstration, verbal and tactile cues throughout for technique.  NuStep - L3 x 6' (UE/LE) Seated hip ADD isometric ball squeeze 10 x 5 Seated R hip adductor stretch 2 x 30 Seated hip ADD ball squeeze + R LAQ x 10 Seated  hip hinge R HS stretch 2 x  30 Seated R knee flexion/extension heel slides on slider 2 x 10 Supine manual L hip adductor stretch 2 x 30 Supine manual L HS stretch 2 x 30 Supine manual L ITB stretch 2 x 30 Supine R quad set into towel roll 10 x 5  MANUAL THERAPY: To promote normalized muscle tension, improved flexibility, improved joint mobility, increased ROM, reduced pain, and reduced edema utilizing joint mobilization, connective tissue massage, therapeutic massage, manual TP therapy, and kinesiotaping. Kinesiotaping to R knee (blue Kinesiotex Classic) - chondromalacia pattern with slight increased stretch on lateral strip to promote medial patellar glide R patellar mobilization all directions - very limited mobility with some increased pain reported at points of PT pressure STM/DTM and manual TPR to R hip adductors, quads and ITB   01/16/24  THERAPEUTIC ACTIVITIES: To improve functional performance.  Demonstration, verbal and tactile cues throughout for technique. 5xSTS = 47.84 sec TUG = 44.75 sec with SPC = 54.47 sec with SPC Gait speed = 0.60 ft/sec with SPC  GAIT TRAINING: To normalize gait pattern and improve safety with SPC. 31' with SPC and SBA of PT - repeated verbal and visual cues/demonstration for step through pattern and coordination of SPC with R LE, however patient continues to demonstrate step to pattern  THERAPEUTIC EXERCISE: To improve strength, ROM, and flexibility.  Demonstration, verbal and tactile cues throughout for technique. Supine quad set on towel roll 10 x 5 Supine hip ABD/ADD x 10 Manual HS stretch by PT 2 x 30 Mod thomas quad/hip flexor stretch 2 x 30-60 Attempted R hip ADD butterfly stretch but deferred d/t increased pain  B HS curl/AAROM R knee flexion with heels on peanut ball and strap assist for R LE 2 x 10  MANUAL THERAPY: To promote normalized muscle tension, improved flexibility, improved joint mobility, increased ROM, and reduced pain utilizing joint  mobilization, connective tissue massage, therapeutic massage, and kinesiotaping.  STM and gentle IASTM with foam roller to R quads, hip adductors and ITB R patellar mobilization all directions - very limited mobility however able to reduce pain with heel slides when PT providing medial patellar glide Kinesiotaping to R knee (sensitive skin tape - light blue Kinesiotex Gold) - chondromalacia pattern with slight increased stretch on lateral strip to promote medial patellar glide  MODALITIES:  Ice pack to R knee x 10' in sitting with LE elevated on 9 stool to reduce post-exercise pain and edema   01/09/24 - Eval SELF CARE:   Reviewed eval findings and role of PT in addressing identified deficits as well as instruction in initial HEP (see below).  Provided instruction in gait training with SPC in L hand to offset R knee.  Patient only able to demonstrate step to pattern despite repeated verbal cues and demonstration of step through pattern.  Further training indicated next visit. Discussed taking her lasix  more frequently (prescribed for daily PRN) to help with LE edema as she notes slight improvement in LE edema and knee pain after she takes the lasix .  Also explained that her potassium supplement should be taken with the lasix  to offset the loss of potassium as a result of the lasix .    PATIENT EDUCATION:  Education details: standardized testing results and interpretation, HEP review, gait safety with SPC, and Ktape wearing and removal instructions  Person educated: Patient Education method: Explanation, Demonstration, Verbal cues, Tactile cues, and Handouts Education comprehension: verbalized understanding, returned demonstration, verbal cues required, tactile cues required,  and needs further education  HOME EXERCISE PROGRAM: Access Code: [email or web address] URL: [email or web address] Date: 01/18/2024 Prepared by: Elijah Hidden  Exercises - Supine Quad Set on Towel Roll  - 2-3 x daily  - 7 x weekly - 2 sets - 10 reps - 5 sec hold - Seated Heel Slide  - 2-3 x daily - 7 x weekly - 2 sets - 10 reps - 3 sec hold - Seated Hip Adduction Isometrics with Ball  - 2 x daily - 7 x weekly - 2 sets - 10 reps - 3-5 sec hold - Seated Long Arc Quad with Hip Adduction  - 2 x daily - 7 x weekly - 2 sets - 10 reps - 3 sec hold - Seated Hip Adductor Stretch  - 2 x daily - 7 x weekly - 3 reps - 30 sec hold - Seated Hamstring Stretch  - 2 x daily - 7 x weekly - 3 reps - 30 sec hold   ASSESSMENT:  CLINICAL IMPRESSION: Pt still with c/o high levels of pain R knee and R lat hip .  Today quite limited initially with R knee flexion to 60 degrees, greater than 100 by end of session.  Changed treatment today to more manual techniques due to not much change after first 4 visits.  Addressed hip ROM, flexibility .  Also education to utilize walker in her house to reduce compressive forces R knee/leg.  Did not observe rash today R lower leg and pt did not mention it. Lorissa will benefit from continued skilled PT to address ongoing ROM, flexibility, abnormal muscle tension, strength and balance deficits to improve mobility and activity tolerance with decreased pain interference and decreased risk for falls.    EVAL: Kyiah N Davidovich is a 83 y.o. female who was referred to physical therapy for evaluation and treatment for acute R knee pain secondary to osteoarthritis.  Patient reports onset of R knee pain beginning in early October without known injury or precipitating event, other than having gone out to dinner with her family the night before.  Pain is worse with any standing or walking, as well as lower body dressing.  Patient has deficits in R knee ROM, B LE flexibility, B LE strength, abnormal posture, antalgic gait pattern with evidence of gait instability, and TTP with abnormal muscle tension which are interfering with ADLs and are impacting quality of life.  On LEFS patient scored 2/80 demonstrating  severe functional limitation.  Further standardized balance testing to be completed next visit.  Helana will benefit from skilled PT to address above deficits to improve mobility and activity tolerance with decreased pain interference.  OBJECTIVE IMPAIRMENTS: Abnormal gait, decreased activity tolerance, decreased balance, decreased endurance, decreased knowledge of condition, decreased knowledge of use of DME, decreased mobility, difficulty walking, decreased ROM, decreased strength, decreased safety awareness, increased edema, increased fascial restrictions, impaired perceived functional ability, increased muscle spasms, impaired flexibility, improper body mechanics, postural dysfunction, and pain.   ACTIVITY LIMITATIONS: carrying, lifting, bending, sitting, standing, squatting, sleeping, stairs, transfers, bed mobility, bathing, dressing, and locomotion level  PARTICIPATION LIMITATIONS: meal prep, cleaning, laundry, driving, shopping, community activity, and church  PERSONAL FACTORS: Age, Behavior pattern, Fitness, Past/current experiences, Time since onset of injury/illness/exacerbation, and 3+ comorbidities: Extensive PMH including chronic back and neck pain, OA, osteoporosis, DM-II, HTN, h/o TIA, diabetic peripheral neuropathy, PVD, headaches, sleep dysfunction, anxiety, R breast cancer s/p lumpectomy 07/31/22, SVT  are also affecting patient's functional outcome.   REHAB POTENTIAL: Good  CLINICAL DECISION MAKING:  Evolving/moderate complexity  EVALUATION COMPLEXITY: Moderate   GOALS: Goals reviewed with patient? Yes  SHORT TERM GOALS: Target date: 02/20/2024  Patient will be independent with initial HEP. Baseline: HEP initiated on eval 01/16/24 - Limited review today Goal status: IN PROGRESS - 01/18/24 - full HEP review with slight modification/update  2.  Patient will report at least 25% improvement in R knee pain to improve QOL. Baseline: 10/10 Goal status: INITIAL  3.  Patient  will demonstrate improved R knee AROM to >/= 10-95 to allow for normal gait and stair mechanics. Baseline: Refer to above LE ROM table - R knee AROM 18-82, with 8 Extension lacking when knee supported  Goal status: INITIAL  4.  Complete standardized balance testing and update POC/goals as indicated. Baseline:  Goal status: IN PROGRESS - 01/16/24 - 5xSTS, TUG and completed   LONG TERM GOALS: Target date: 04/02/2024  Patient will be independent with advanced/ongoing HEP to improve outcomes and carryover.  Baseline:  Goal status: INITIAL  2.  Patient will report at least 50% improvement in R knee pain to improve QOL. Baseline: 10/10 Goal status: INITIAL  3.  Patient will demonstrate improved R knee AROM to >/= 5-110 to allow for normal gait and stair mechanics. Baseline: Refer to above LE ROM table - R knee AROM 18-82, with 8 Extension lacking when knee supported  Goal status: INITIAL  4.  Patient will demonstrate improved B LE strength to >/= 4 to 4+/5 for improved stability and ease of mobility. Baseline: Refer to above LE MMT table Goal status: INITIAL  5.  Patient will be able to ambulate 600' with LRAD and normal gait pattern without increased pain to access community.  Baseline: Antalgic step to pattern with SPC on L, worsening antalgic gait pattern without AD Goal status: INITIAL  6. Patient will report >/= 30/80 on LEFS (MCID = 9 pts) to demonstrate improved functional ability. Baseline: 2 / 80 = 2.5 % Goal status: INITIAL  7.  Patient will demonstrate at least 19/24 on DGI to decrease risk of falls. Baseline: NT on eval, 21/24 as of DC from PT on 07/09/23 Goal status: INITIAL   8.  Patient will improve 5x STS time to </= 25 seconds to demonstrate improved functional strength and transfer efficiency. Baseline: 47.84 sec - 01/16/24, 22.59 sec as of DC from PT on 07/09/23 Goal status: INITIAL   9.  Patient will demonstrate decreased TUG time to </= 18 sec to  decrease risk for falls with transitional mobility. Baseline: 01/16/24 - 44.75 sec with SPC, 13.5 sec w/o AD as of 05/03/23 Goal status: INITIAL  10.  Patient will demonstrate gait speed of >/= 1.81 ft/sec to be a safe limited community ambulator with decreased risk for recurrent falls.  Baseline: 01/16/24 - 0.60 ft/sec with SPC, 2.78 ft/sec w/o AD as of 05/15/23 Goal status: INITIAL     PLAN:  PT FREQUENCY: 2x/week  PT DURATION: 12 weeks  PLANNED INTERVENTIONS: 97164- PT Re-evaluation, 97750- Physical Performance Testing, 97110-Therapeutic exercises, 97530- Therapeutic activity, 97112- Neuromuscular re-education, 97535- Self Care, 02859- Manual therapy, [phone]- Gait training, [phone]- Aquatic Therapy, [phone]- Electrical stimulation (unattended), 97016- Vasopneumatic device, [id number]- Ultrasound, [id number]- Ionotophoresis 4mg /ml Dexamethasone , 79439 (1-2 muscles), [id number] (3+ muscles)- Dry Needling, Patient/Family education, Balance training, Stair training, Taping, Joint mobilization, DME instructions, Cryotherapy, and Moist heat  PLAN FOR NEXT SESSION: complete standardized balance testing - DGI; gently progress R knee ROM; LE strengthening with emphasis on quads and hip stability; review and update  HEP as indicated; manual therapy and/or modalities PRN for pain management;  Greig LITTIE Credit, PT, DPT,OCS 01/29/2024, 1:18 PM     Date of referral: 12/20/2023 Referring provider: Charles Redell LABOR, DO Referring diagnosis? M17.11 (ICD-10-CM) - Primary osteoarthritis of right knee Treatment diagnosis? (if different than referring diagnosis)  Acute pain of right knee  Muscle weakness (generalized)  Stiffness of right knee, not elsewhere classified  Other abnormalities of gait and mobility  Unsteadiness on feet  What was this (referring dx) caused by? Arthritis  Nature of Condition: Initial Onset (within last 3 months)   Laterality: Rt  Current Functional Measure Score: LEFS 2 / 80 = 2.5  %  Objective measurements identify impairments when they are compared to normal values, the uninvolved extremity, and prior level of function.  [x]  Yes  []  No  Objective assessment of functional ability: Severe functional limitations   Briefly describe symptoms:  [name] is a 83 y.o. female who was referred to physical therapy for evaluation and treatment for acute R knee pain secondary to osteoarthritis.  Patient reports onset of R knee pain beginning in early October without known injury or precipitating event, other than having gone out to dinner with her family the night before.  Pain is worse with any standing or walking, as well as lower body dressing.  Patient has deficits in R knee ROM, B LE flexibility, B LE strength, abnormal posture, antalgic gait pattern with evidence of gait instability, and TTP with abnormal muscle tension which are interfering with ADLs and are impacting quality of life.  On LEFS patient scored 2/80 demonstrating severe functional limitation.  Further standardized balance testing to be completed next visit.  How did symptoms start: Sudden onset of pain and swelling in her R knee the day after she had been out with family  Average pain intensity:  Last 24 hours: 10/10  Past week: 10/10  How often does the pt experience symptoms? Constantly  How much have the symptoms interfered with usual daily activities? Extremely  How has condition changed since care began at this facility? NA - initial visit  In general, how is the patients overall health? Fair  Onset date: early October   BACK PAIN (STarT Back Screening Tool) - (When applicable): N/A  Has your back pain spread down your leg(s) at sometime in the last 2 weeks? []  Yes   []  No Have you had pain in the shoulder or neck at sometime in the past 2 weeks? []  Yes   []  No Have you only walked short distances because of your back pain? []  Yes   []  No In the past 2 weeks, have you dressed more  slowly than usual because of your back pain? []  Yes   []  No Do you think it is not really safe for person with a condition like yours to be physically active? []  Yes   []  No Have worrying thoughts been going through your mind a lot of the time? []  Yes   []  No Do you feel that your back pain is terrible and it is never going to get any better? []  Yes   []  No In general, have you stopped enjoying all the things you usually enjoy? []  Yes   []  No Overall, how bothersome has your back pain been in the last 2 weeks? []  Not at all   []  Slightly     []  Moderate   []  Very much     []  Extremely

## 2024-02-04 ENCOUNTER — Ambulatory Visit: Admitting: Physical Therapy

## 2024-02-04 ENCOUNTER — Encounter: Payer: Self-pay | Admitting: Physical Therapy

## 2024-02-04 DIAGNOSIS — M25561 Pain in right knee: Secondary | ICD-10-CM | POA: Diagnosis not present

## 2024-02-04 DIAGNOSIS — M25661 Stiffness of right knee, not elsewhere classified: Secondary | ICD-10-CM

## 2024-02-04 DIAGNOSIS — R2689 Other abnormalities of gait and mobility: Secondary | ICD-10-CM

## 2024-02-04 DIAGNOSIS — R2681 Unsteadiness on feet: Secondary | ICD-10-CM

## 2024-02-04 DIAGNOSIS — M6281 Muscle weakness (generalized): Secondary | ICD-10-CM

## 2024-02-04 NOTE — Therapy (Signed)
 " OUTPATIENT PHYSICAL THERAPY TREATMENT   Patient Name: [name] MRN: [id number] DOB:20-Nov-1940, 83 y.o., female Today's Date: 02/04/2024   END OF SESSION:  PT End of Session - 02/04/24 1147     Visit Number 6    Date for Recertification  04/02/24    Authorization Type [employer] Medicare    Authorization Time Period 01/09/24 - 04/02/24    Authorization - Visit Number 6    Authorization - Number of Visits 6    Progress Note Due on Visit 10    PT Start Time 1147    PT Stop Time 1246    PT Time Calculation (min) 59 min    Activity Tolerance Patient tolerated treatment well;Patient limited by pain    Behavior During Therapy [facility] for tasks assessed/performed               Past Medical History:  Diagnosis Date   Abdominal pain in female 03/18/2010   Qualifier: Diagnosis of  By: Avram MD, NOLIA Pitts E    Anemia 06/08/2014   Anxiety    Arthritis    Spinal Osteoarthritis   Breast cancer (HCC)    Carcinoid tumor of stomach (HCC)    Cataract    Chest pain    Myoview  12/15 no ischemia.   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 09/05/2006   Qualifier: Diagnosis of  By: Wilhemina RMA, Lucy     Diabetic peripheral neuropathy (HCC) 10/29/2013   Encounter for preventative adult health care exam with abnormal findings 09/14/2013   Esophageal reflux    Gastric polyp    Fundic Gland   Gastroparesis    Headache(784.0)    Heart murmur    Echocardiogram 2/11: EF 60-65%, mild LAE, grade 1 diastolic dysfunction, aortic valve sclerosis, mean gradient 9 mm of mercury, PASP 34   Hematuria 03/16/2016   Iron  deficiency anemia, unspecified    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   Leg swelling    bilateral   Neck pain 04/22/2015   PONV (postoperative nausea and vomiting)    pt states body temperature drops every time she has anesthesia; pt states only needs small amount of anesthesia   PSVT (paroxysmal  supraventricular tachycardia)    Pure hypercholesterolemia    Recurrent UTI 01/11/2016   Renal insufficiency 06/18/2019   pt states  L kidney function very low-functions at about 20%   Stroke [facility])    tia, 2014   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   Type II or unspecified type diabetes mellitus without mention of complication, not stated as uncontrolled    Unspecified essential hypertension    Unspecified hereditary and idiopathic peripheral neuropathy 10/29/2013   Vitamin D  deficiency    Past Surgical History:  Procedure Laterality Date   BREAST BIOPSY Right 06/13/2022   US  RT BREAST BX W LOC DEV 1ST LESION IMG BX SPEC US  GUIDE 06/13/2022 [facility] MAMMOGRAPHY   BREAST LUMPECTOMY Right 07/31/2022   Procedure: RIGHT BREAST LUMPECTOMY;  Surgeon: Vernetta Berg, MD;  Location: [facility];  Service: General;  Laterality: Right;   CHOLECYSTECTOMY  1993   COLONOSCOPY  11/11/2010   diverticulosis   DILATATION & CURRETTAGE/HYSTEROSCOPY WITH RESECTOCOPE N/A 02/25/2013   Procedure: Attempted hysteroscopy with uterine perforation;  Surgeon: Bobie FORBES Crown de Charlynn FORBES Cary, MD;  Location: [facility];  Service: Gynecology;  Laterality: N/A;   ESOPHAGOGASTRODUODENOSCOPY  08/29/2010; 09/15/2010   Carcinoid tumor less  than 1 cm in July 2012 not seen in August 2012 , gastritis, fundic gland polyps   ESOPHAGOGASTRODUODENOSCOPY  05/16/2011   ESOPHAGOGASTRODUODENOSCOPY  06/14/2012   EUS  12/15/2010   Procedure: UPPER ENDOSCOPIC ULTRASOUND (EUS) LINEAR;  Surgeon: Toribio Cedar, MD;  Location: [facility];  Service: Endoscopy;  Laterality: N/A;   EYE SURGERY Bilateral    Bi lateral cateracts and bi lateral laser   LAPAROSCOPY N/A 02/25/2013   Procedure: Cystoscopy and laparoscopy with fulguration of uterine serosa;  Surgeon: Bobie FORBES Crown de Charlynn FORBES Cary, MD;  Location: [facility];  Service: Gynecology;  Laterality: N/A;   TONSILLECTOMY     Patient Active Problem List   Diagnosis Date Noted   Acute pain of  right knee 11/20/2023   Precordial chest pain 12/24/2022   Carcinoma of breast upper outer quadrant, right (HCC) 07/04/2022   SVT (supraventricular tachycardia) 06/21/2022   Left-sided weakness 04/02/2022   Unsteady gait 04/02/2022   Oral lesion 03/08/2021   Sun-damaged skin 03/08/2021   Thiamine  deficiency 11/01/2020   Trigeminal neuralgia 08/21/2020   Pedal edema 08/21/2020   Chronic left-sided back pain 03/28/2020   Nocturia 03/28/2020   Left-sided headache 03/28/2020   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   Renal insufficiency 06/18/2019   Educated about COVID-19 virus infection 05/15/2019   Atrophic vaginitis 01/20/2019   Stenosis of carotid artery 11/12/2018   Anxiety 10/13/2018   Referred otalgia of left ear 04/23/2018   Chronic throat clearing 04/23/2018   Sinusitis 10/11/2017   Headache 07/12/2017   Dizzy spells 11/21/2016   Constipation 11/21/2016   Nonrheumatic aortic valve stenosis 05/23/2016   Aortic atherosclerosis 05/23/2016   Hematuria 03/16/2016   Recurrent UTI 01/11/2016   Pain of upper abdomen 06/06/2015   Neck pain 04/22/2015   Ear pain 02/28/2015   Abnormal urine 11/21/2014   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   Anemia 06/08/2014   RLS (restless legs syndrome) 11/23/2013   Diabetic peripheral neuropathy (HCC) 10/29/2013   Left-sided thoracic back pain 10/06/2013   Encounter for preventative adult health care exam with abnormal findings 09/14/2013   Iron  deficiency anemia    Status post laparoscopy 02/25/2013   Hyponatremia 01/09/2013   GERD (gastroesophageal reflux disease) 01/09/2013   Amaurosis fugax of left eye 10/16/2012   Low back pain 06/03/2012   Vitamin D  deficiency 03/11/2012   Bilateral hand pain 10/20/2011   Encounter for long-term (current) use of other medications 10/20/2011   IBS (irritable bowel syndrome) 08/14/2011   TIA (transient ischemic attack) 02/10/2011   Abnormal brain CT 01/19/2011   Allergic rhinitis  10/01/2010   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   Preventative health care 07/15/2010   FUNDIC GLAND POLYPS OF STOMACH 03/18/2010   Abdominal pain in female 03/18/2010   Left hip pain 03/17/2009   SYSTOLIC MURMUR 03/02/2009   Paroxysmal supraventricular tachycardia 01/12/2009   PLANTAR FASCIITIS 06/08/2008   CHEST PAIN 05/18/2008   Gastroparesis 12/18/2007   HYPERCHOLESTEROLEMIA 06/11/2007   Type 2 diabetes mellitus with obesity 09/05/2006   Essential hypertension 09/05/2006    PCP: Domenica Harlene LABOR, MD   REFERRING PROVIDER: Charles Redell LABOR, DO  REFERRING DIAG: M17.11 (ICD-10-CM) - Primary osteoarthritis of right knee   THERAPY DIAG:  Acute pain of right knee  Muscle weakness (generalized)  Stiffness of right knee, not elsewhere classified  Other abnormalities of gait and mobility  Unsteadiness on feet  RATIONALE FOR EVALUATION AND TREATMENT: Rehabilitation  ONSET DATE: early October  NEXT  MD VISIT: TBD   SUBJECTIVE:                                                                                                                                                                                                         SUBJECTIVE STATEMENT: Pt reports knee pain is much better with pain no longer all the time and she feels like she can move it better.   EVAL: Pt reports on Sunday in early October she was out for dinner with her family and the next day her knee was swollen and she could barely put weight on it.  She waited about 2 weeks before going to see the doctor.  MD drew some fluid off and gave her a cortisone shot which helped for ~1 week but now the pain is back and she feels crippled. Pain prevents her from walking much or going out.  Difficulty putting on her clothes and shoes.  She also notes a burning feeling in her knee and anterior leg.  She has variable swelling in her knee - better today after taking lasix  yesterday.  She states she has to use a  cane at home but did not bring it with her to PT today.  She states now her R hip and back are also hurting due to limping for her knee.  Pain prevents her from sleeping, esp on her sides, which is her preferred sleeping position.  PAIN: Are you having pain? Yes: NPRS scale: 7-8/10  Pain location: R knee, lower thigh and upper lower leg  Pain description: sharp  Aggravating factors: walking, prolonged standing, lower body dressing  Relieving factors: Voltaren  gel, Tylenol , ice - nothing give much relief  PERTINENT HISTORY:  Extensive PMH including chronic back and neck pain, OA, osteoporosis, DM-II, HTN, h/o TIA, diabetic peripheral neuropathy, PVD, headaches, sleep dysfunction, anxiety, R breast cancer s/p lumpectomy 07/31/22, SVT (refer to above problem list and past medical/surgical history for full PMH)   PRECAUTIONS: Fall  RED FLAGS: None  WEIGHT BEARING RESTRICTIONS: No  FALLS:  Has patient fallen in last 6 months? No  LIVING ENVIRONMENT: Lives with: lives alone Lives in: House Stairs: No Has following equipment at home: Single point cane, shower chair, and Grab bars  OCCUPATION: Retired  PLOF: Independent, Needs assistance with homemaking, and Leisure: read and play card games on her phone, traveling, and housekeeping/cleaning service ~1x/month   PATIENT GOALS: Just to walk freely with no pain. Be able to climb stairs.   OBJECTIVE: (objective measures completed at initial evaluation unless otherwise dated)  DIAGNOSTIC  FINDINGS:  11/27/23 - DG RIGHT KNEE - COMPLETE 4+ VIEW CLINICAL DATA:  Right knee pain. Primary osteoarthritis of right knee. Acute knee pain.   FINDINGS: The alignment and joint spaces are preserved. Mild tricompartmental peripheral spurring and spurring of the tibial spines. Small knee joint effusion. No fracture, erosion, or focal bone abnormality. Frontal view of the left knee included for comparison demonstrates tibiofemoral osteoarthritis.    IMPRESSION: Mild tricompartmental osteoarthritis of the right knee. Small knee joint effusion.  PATIENT SURVEYS:  LEFS  Extreme difficulty/unable (0), Quite a bit of difficulty (1), Moderate difficulty (2), Little difficulty (3), No difficulty (4) Survey date:  01/09/24  02/04/24  Any of your usual work, housework or school activities 0 1  2. Usual hobbies, recreational or sporting activities 0 0  3. Getting into/out of the bath 1 1  4. Walking between rooms 1 3  5. Putting on socks/shoes 0 0  6. Squatting  0 0  7. Lifting an object, like a bag of groceries from the floor 0 1  8. Performing light activities around your home 0 1  9. Performing heavy activities around your home 0 0  10. Getting into/out of a car 0 0  11. Walking 2 blocks 0 0  12. Walking 1 mile 0 0  13. Going up/down 10 stairs (1 flight) 0 0  14. Standing for 1 hour 0 1  15.  sitting for 1 hour 0 4  16. Running on even ground 0 0  17. Running on uneven ground 0 0  18. Making sharp turns while running fast 0 0  19. Hopping  0 0  20. Rolling over in bed 0 1  Score total:  2/80 13/80  Functional limitation: Severe Severe     COGNITION: Overall cognitive status: Within functional limits for tasks assessed    SENSATION: WFL Pt reports burning and itching in her knee at times  EDEMA:  Circumferential:   Mid patella: R = 46.0 cm L = 43.5 cm  6 cm above: R = 49.0 cm L = 45.0 cm  6 cm below: R = 43.0 cm L = 40.0 cm  POSTURE:  rounded shoulders, forward head, and weight shift left  PALPATION: Very TTP over patella and anterior knee - increased edema apparent with decreased patellar mobility and poor quad activation (especially VMO) with quad set  MUSCLE LENGTH: TBA next visit Hamstrings:  ITB:  Piriformis:  Hip flexors:  Quads:  Heelcord:   LOWER EXTREMITY ROM:  Active ROM Right eval Left eval R 02/04/24  Knee flexion 82 120 104  Knee extension 18 LAQ /  8 supported 0 11 LAQ /  2  supported  (Blank rows = not tested)  LOWER EXTREMITY MMT: (Tested in sitting on eval)  MMT Right eval Left eval  Hip flexion 3 3+  Hip extension 2 3-  Hip abduction 3+ 4-  Hip adduction 3+ 4-  Hip internal rotation    Hip external rotation    Knee flexion 3+ 4-  Knee extension 3- 4  Ankle dorsiflexion 3+ 4-  Ankle plantarflexion    Ankle inversion    Ankle eversion     (Blank rows = not tested)  FUNCTIONAL TESTS: 01/16/24 5 times sit to stand: 47.84 sec  Timed up and go (TUG): 44.75 sec with SPC  10 meter walk test: 54.47 sec with SPC  Gait speed = 0.60 ft/sec with SPC  GAIT: Distance walked: 14' Assistive device utilized: Single point cane  and None Level of assistance: SBA Gait pattern: step to pattern, decreased step length- Left, decreased stride length, decreased hip/knee flexion- Right, antalgic, and lateral lean- Left Comments: Increased instability evident with patient reporting she uses SPC at home but did not bring it with her to PT   TODAY'S TREATMENT:    02/04/2024  THERAPEUTIC EXERCISE: To improve strength, endurance, and ROM.  Demonstration, verbal and tactile cues throughout for technique.  NuStep - L2 x 6' (UE/LE)  SELF CARE:   Provided instruction in application of Galvaran Knee Brace ([email or web address]://[email or web address]) which patient's sister recommended to her and she brought with to the therapy session.  Brace seemed a little snug and in the short trial of wearing it during therapy session seem to create some friction irritation along the edges of the brace and the seems with patient not really noting any difference in knee pain while wearing the brace, therefore recommended that she would probably be better off not using the brace.  MANUAL THERAPY: To promote normalized muscle tension, improved flexibility, improved joint mobility, increased ROM, and reduced pain utilizing connective tissue massage, therapeutic massage, manual TP therapy, and percussion massage  with massage gun.  STM/DTM and percussion massage with massage gun to R glutes, piriformis, ITB, lateral quads and hamstrings  THERAPEUTIC ACTIVITIES: To improve functional performance.  Demonstration, verbal and tactile cues throughout for technique. LEFS: 13 / 80 = 16.3 % R knee ROM assessment - see ROM table above   01/29/24: Supine for AP jt mobs L prox tibia with thigh over bolster, 2 bouts 45 sec each, gentle, to improve R knee flexion motion  Mob with movement, lat glides R hip while assisting with R hip IR/ER and flex Side lying L with R thigh over 6 roll, for theragun massage R lateral hip musculature Supine with bolster under knees , manually resisted R hip IR, and then manually resisted R hip IR , 15 reps each  Ankle pumps with theraband for R ankle plantarflexion motion.   01/22/24 Nustep L1x32min UE/LE Seated R LE PF GTB X 10 Seated hip ABD X 10 B Seated ball squeeze x 10  Seated ball squeeze + LAQ x 10 Seated R hamstring stretch 2x30 sec Seated R adductor stretch 2x30 sec Seated R foot on 9' stool piriformis stretch 2x30 sec   01/18/24 THERAPEUTIC EXERCISE: To improve strength, endurance, ROM, and flexibility.  Demonstration, verbal and tactile cues throughout for technique.  NuStep - L3 x 6' (UE/LE) Seated hip ADD isometric ball squeeze 10 x 5 Seated R hip adductor stretch 2 x 30 Seated hip ADD ball squeeze + R LAQ x 10 Seated hip hinge R HS stretch 2 x 30 Seated R knee flexion/extension heel slides on slider 2 x 10 Supine manual L hip adductor stretch 2 x 30 Supine manual L HS stretch 2 x 30 Supine manual L ITB stretch 2 x 30 Supine R quad set into towel roll 10 x 5  MANUAL THERAPY: To promote normalized muscle tension, improved flexibility, improved joint mobility, increased ROM, reduced pain, and reduced edema utilizing joint mobilization, connective tissue massage, therapeutic massage, manual TP therapy, and kinesiotaping. Kinesiotaping to R knee  (blue Kinesiotex Classic) - chondromalacia pattern with slight increased stretch on lateral strip to promote medial patellar glide R patellar mobilization all directions - very limited mobility with some increased pain reported at points of PT pressure STM/DTM and manual TPR to R hip adductors, quads and ITB   01/16/24  THERAPEUTIC ACTIVITIES: To improve  functional performance.  Demonstration, verbal and tactile cues throughout for technique. 5xSTS = 47.84 sec TUG = 44.75 sec with SPC = 54.47 sec with SPC Gait speed = 0.60 ft/sec with SPC  GAIT TRAINING: To normalize gait pattern and improve safety with SPC. 29' with SPC and SBA of PT - repeated verbal and visual cues/demonstration for step through pattern and coordination of SPC with R LE, however patient continues to demonstrate step to pattern  THERAPEUTIC EXERCISE: To improve strength, ROM, and flexibility.  Demonstration, verbal and tactile cues throughout for technique. Supine quad set on towel roll 10 x 5 Supine hip ABD/ADD x 10 Manual HS stretch by PT 2 x 30 Mod thomas quad/hip flexor stretch 2 x 30-60 Attempted R hip ADD butterfly stretch but deferred d/t increased pain  B HS curl/AAROM R knee flexion with heels on peanut ball and strap assist for R LE 2 x 10  MANUAL THERAPY: To promote normalized muscle tension, improved flexibility, improved joint mobility, increased ROM, and reduced pain utilizing joint mobilization, connective tissue massage, therapeutic massage, and kinesiotaping.  STM and gentle IASTM with foam roller to R quads, hip adductors and ITB R patellar mobilization all directions - very limited mobility however able to reduce pain with heel slides when PT providing medial patellar glide Kinesiotaping to R knee (sensitive skin tape - light blue Kinesiotex Gold) - chondromalacia pattern with slight increased stretch on lateral strip to promote medial patellar glide  MODALITIES:  Ice pack to R knee x 10' in  sitting with LE elevated on 9 stool to reduce post-exercise pain and edema   01/09/24 - Eval SELF CARE:   Reviewed eval findings and role of PT in addressing identified deficits as well as instruction in initial HEP (see below).  Provided instruction in gait training with SPC in L hand to offset R knee.  Patient only able to demonstrate step to pattern despite repeated verbal cues and demonstration of step through pattern.  Further training indicated next visit. Discussed taking her lasix  more frequently (prescribed for daily PRN) to help with LE edema as she notes slight improvement in LE edema and knee pain after she takes the lasix .  Also explained that her potassium supplement should be taken with the lasix  to offset the loss of potassium as a result of the lasix .    PATIENT EDUCATION:  Education details: knee brace application - not recommended for regular use    Person educated: Patient Education method: Explanation, Demonstration, and Verbal cues Education comprehension: verbalized understanding and needs further education  HOME EXERCISE PROGRAM: Access Code: [email or web address] URL: [email or web address] Date: 01/18/2024 Prepared by: Elijah Hidden  Exercises - Supine Quad Set on Towel Roll  - 2-3 x daily - 7 x weekly - 2 sets - 10 reps - 5 sec hold - Seated Heel Slide  - 2-3 x daily - 7 x weekly - 2 sets - 10 reps - 3 sec hold - Seated Hip Adduction Isometrics with Ball  - 2 x daily - 7 x weekly - 2 sets - 10 reps - 3-5 sec hold - Seated Long Arc Quad with Hip Adduction  - 2 x daily - 7 x weekly - 2 sets - 10 reps - 3 sec hold - Seated Hip Adductor Stretch  - 2 x daily - 7 x weekly - 3 reps - 30 sec hold - Seated Hamstring Stretch  - 2 x daily - 7 x weekly - 3 reps - 30 sec hold  ASSESSMENT:  CLINICAL IMPRESSION: [name] reports her R knee pain is much better today although pain rating only dropped from 9/10 last visit to 7-8/10 today.  She states her pain is no  longer constant but still occurs on a frequent basis and still significantly impacts most daily activities.  R knee AROM improving to 11-104 with supported R knee extension down to 2, demonstrating significant gains from ROM observed at eval. LEFS demonstrating 11 point improvement from eval but still demonstrating severe functional limitation at 13/80.  Treatment focus continuing on manual interventions to address abnormal muscle tension and tightness to reduce pain and improve R knee ROM, and will hopefully be able to transition to more strengthening and functional activity-focused interventions in upcoming visits.  [name] will benefit from continued skilled PT to address ongoing ROM, flexibility, abnormal muscle tension, strength and balance deficits to improve mobility and activity tolerance with decreased pain interference and decreased risk for falls.    EVAL: [name] is a 83 y.o. female who was referred to physical therapy for evaluation and treatment for acute R knee pain secondary to osteoarthritis.  Patient reports onset of R knee pain beginning in early October without known injury or precipitating event, other than having gone out to dinner with her family the night before.  Pain is worse with any standing or walking, as well as lower body dressing.  Patient has deficits in R knee ROM, B LE flexibility, B LE strength, abnormal posture, antalgic gait pattern with evidence of gait instability, and TTP with abnormal muscle tension which are interfering with ADLs and are impacting quality of life.  On LEFS patient scored 2/80 demonstrating severe functional limitation.  Further standardized balance testing to be completed next visit.  [name] will benefit from skilled PT to address above deficits to improve mobility and activity tolerance with decreased pain interference.  OBJECTIVE IMPAIRMENTS: Abnormal gait, decreased activity tolerance, decreased balance, decreased endurance,  decreased knowledge of condition, decreased knowledge of use of DME, decreased mobility, difficulty walking, decreased ROM, decreased strength, decreased safety awareness, increased edema, increased fascial restrictions, impaired perceived functional ability, increased muscle spasms, impaired flexibility, improper body mechanics, postural dysfunction, and pain.   ACTIVITY LIMITATIONS: carrying, lifting, bending, sitting, standing, squatting, sleeping, stairs, transfers, bed mobility, bathing, dressing, and locomotion level  PARTICIPATION LIMITATIONS: meal prep, cleaning, laundry, driving, shopping, community activity, and church  PERSONAL FACTORS: Age, Behavior pattern, Fitness, Past/current experiences, Time since onset of injury/illness/exacerbation, and 3+ comorbidities: Extensive PMH including chronic back and neck pain, OA, osteoporosis, DM-II, HTN, h/o TIA, diabetic peripheral neuropathy, PVD, headaches, sleep dysfunction, anxiety, R breast cancer s/p lumpectomy 07/31/22, SVT  are also affecting patient's functional outcome.   REHAB POTENTIAL: Good  CLINICAL DECISION MAKING: Evolving/moderate complexity  EVALUATION COMPLEXITY: Moderate   GOALS: Goals reviewed with patient? Yes  SHORT TERM GOALS: Target date: 02/20/2024  Patient will be independent with initial HEP. Baseline: HEP initiated on eval 01/16/24 - Limited review today 01/18/24 - full HEP review with slight modification/update Goal status: IN PROGRESS - 02/04/24 - Pt denies concerns, but not formally reviewed  2.  Patient will report at least 25% improvement in R knee pain to improve QOL. Baseline: 10/10 Goal status: MET - 02/04/24 - 30-40% improvement in R knee pain  3.  Patient will demonstrate improved R knee AROM to >/= 10-95 to allow for normal gait and stair mechanics. Baseline: Refer to above LE ROM table - R knee AROM 18-82, with 8 extension lacking when  knee supported  Goal status: PARTIALLY MET - 02/04/24 - R  knee AROM 11-104, with 2 lacking in supported extension  4.  Complete standardized balance testing and update POC/goals as indicated. Baseline:  Goal status: IN PROGRESS - 01/16/24 - 5xSTS, TUG and completed   LONG TERM GOALS: Target date: 04/02/2024  Patient will be independent with advanced/ongoing HEP to improve outcomes and carryover.  Baseline:  Goal status: INITIAL  2.  Patient will report at least 50% improvement in R knee pain to improve QOL. Baseline: 10/10 Goal status: INITIAL  3.  Patient will demonstrate improved R knee AROM to >/= 5-110 to allow for normal gait and stair mechanics. Baseline: Refer to above LE ROM table - R knee AROM 18-82, with 8 Extension lacking when knee supported  Goal status: INITIAL  4.  Patient will demonstrate improved B LE strength to >/= 4 to 4+/5 for improved stability and ease of mobility. Baseline: Refer to above LE MMT table Goal status: INITIAL  5.  Patient will be able to ambulate 600' with LRAD and normal gait pattern without increased pain to access community.  Baseline: Antalgic step to pattern with SPC on L, worsening antalgic gait pattern without AD Goal status: INITIAL  6. Patient will report >/= 30/80 on LEFS (MCID = 9 pts) to demonstrate improved functional ability. Baseline: 2 / 80 = 2.5 % Goal status: IN PROGRESS - 02/04/24 - 13 / 80 = 16.3 %  7.  Patient will demonstrate at least 19/24 on DGI to decrease risk of falls. Baseline: NT on eval, 21/24 as of DC from PT on 07/09/23 Goal status: INITIAL   8.  Patient will improve 5x STS time to </= 25 seconds to demonstrate improved functional strength and transfer efficiency. Baseline: 47.84 sec - 01/16/24, 22.59 sec as of DC from PT on 07/09/23 Goal status: INITIAL   9.  Patient will demonstrate decreased TUG time to </= 18 sec to decrease risk for falls with transitional mobility. Baseline: 01/16/24 - 44.75 sec with SPC, 13.5 sec w/o AD as of 05/03/23 Goal status:  INITIAL  10.  Patient will demonstrate gait speed of >/= 1.81 ft/sec to be a safe limited community ambulator with decreased risk for recurrent falls.  Baseline: 01/16/24 - 0.60 ft/sec with SPC, 2.78 ft/sec w/o AD as of 05/15/23 Goal status: INITIAL     PLAN:  PT FREQUENCY: 2x/week  PT DURATION: 12 weeks  PLANNED INTERVENTIONS: 97164- PT Re-evaluation, 97750- Physical Performance Testing, 97110-Therapeutic exercises, 97530- Therapeutic activity, 97112- Neuromuscular re-education, 97535- Self Care, 02859- Manual therapy, [phone]- Gait training, [phone]- Aquatic Therapy, [phone]- Electrical stimulation (unattended), 97016- Vasopneumatic device, [id number]- Ultrasound, [id number]- Ionotophoresis 4mg /ml Dexamethasone , 79439 (1-2 muscles), [id number] (3+ muscles)- Dry Needling, Patient/Family education, Balance training, Stair training, Taping, Joint mobilization, DME instructions, Cryotherapy, and Moist heat  PLAN FOR NEXT SESSION: complete standardized balance testing - DGI; gently progress R knee ROM; LE strengthening with emphasis on quads and hip stability; review and update HEP as indicated; manual therapy and/or modalities PRN for pain management;  Elijah CHRISTELLA Hidden, PT 02/04/2024, 1:08 PM     Date of referral: 12/20/2023 Referring provider: Charles Redell LABOR, DO Referring diagnosis? M17.11 (ICD-10-CM) - Primary osteoarthritis of right knee Treatment diagnosis? (if different than referring diagnosis)  Acute pain of right knee  Muscle weakness (generalized)  Stiffness of right knee, not elsewhere classified  Other abnormalities of gait and mobility  Unsteadiness on feet  What was this (referring dx) caused by? Arthritis  Nature of Condition: Initial Onset (within last 3 months)   Laterality: Rt  Current Functional Measure Score: LEFS 13 / 80 = 16.3 %  Objective measurements identify impairments when they are compared to normal values, the uninvolved extremity, and prior level of function.  [x]   Yes  []  No  Objective assessment of functional ability: Moderate functional limitations   Briefly describe symptoms:   Solita reports her R knee pain is much better today although pain rating only dropped from 9/10 last visit to 7-8/10 today.  She states her pain is no longer constant but still occurs on a frequent basis and still significantly impacts most daily activities.  R knee AROM improving to 11-104 with supported R knee extension down to 2, demonstrating significant gains from ROM observed at eval. LEFS demonstrating 11 point improvement from eval but still demonstrating severe functional limitation at 13/80.  Lucie will benefit from continued skilled PT to address ongoing ROM, flexibility, abnormal muscle tension, strength and balance deficits to improve mobility and activity tolerance with decreased pain interference and decreased risk for falls.   How did symptoms start: Sudden onset of pain and swelling in her R knee the day after she had been out with family  Average pain intensity:  Last 24 hours: 7/10  Past week: 7-9/10  How often does the pt experience symptoms? Frequently  How much have the symptoms interfered with usual daily activities? Quite a bit  How has condition changed since care began at this facility? Better  In general, how is the patients overall health? Fair  Onset date: early October   BACK PAIN (STarT Back Screening Tool) - (When applicable): N/A  Has your back pain spread down your leg(s) at sometime in the last 2 weeks? []  Yes   []  No Have you had pain in the shoulder or neck at sometime in the past 2 weeks? []  Yes   []  No Have you only walked short distances because of your back pain? []  Yes   []  No In the past 2 weeks, have you dressed more slowly than usual because of your back pain? []  Yes   []  No Do you think it is not really safe for person with a condition like yours to be physically active? []  Yes   []  No Have worrying thoughts  been going through your mind a lot of the time? []  Yes   []  No Do you feel that your back pain is terrible and it is never going to get any better? []  Yes   []  No In general, have you stopped enjoying all the things you usually enjoy? []  Yes   []  No Overall, how bothersome has your back pain been in the last 2 weeks? []  Not at all   []  Slightly     []  Moderate   []  Very much     []  Extremely   "

## 2024-02-06 ENCOUNTER — Encounter: Payer: Self-pay | Admitting: Rehabilitation

## 2024-02-06 ENCOUNTER — Ambulatory Visit: Admitting: Rehabilitation

## 2024-02-06 DIAGNOSIS — M25561 Pain in right knee: Secondary | ICD-10-CM

## 2024-02-06 DIAGNOSIS — M25661 Stiffness of right knee, not elsewhere classified: Secondary | ICD-10-CM

## 2024-02-06 DIAGNOSIS — R2681 Unsteadiness on feet: Secondary | ICD-10-CM

## 2024-02-06 DIAGNOSIS — M6281 Muscle weakness (generalized): Secondary | ICD-10-CM

## 2024-02-06 DIAGNOSIS — R2689 Other abnormalities of gait and mobility: Secondary | ICD-10-CM

## 2024-02-06 NOTE — Therapy (Signed)
 " OUTPATIENT PHYSICAL THERAPY TREATMENT   Patient Name: [name] MRN: [id number] DOB:Nov 12, 1940, 83 y.o., female Today's Date: 02/06/2024   END OF SESSION:  PT End of Session - 02/06/24 1420     Visit Number 7    Date for Recertification  04/02/24    Authorization Type [employer]    Authorization Time Period 01/09/24 - 04/02/24    Authorization - Number of Visits 6    Progress Note Due on Visit 10    PT Start Time 1315    PT Stop Time 1405    PT Time Calculation (min) 50 min    Activity Tolerance Patient tolerated treatment well;Patient limited by pain    Behavior During Therapy [facility] for tasks assessed/performed                Past Medical History:  Diagnosis Date   Abdominal pain in female 03/18/2010   Qualifier: Diagnosis of  By: Avram MD, NOLIA Pitts E    Anemia 06/08/2014   Anxiety    Arthritis    Spinal Osteoarthritis   Breast cancer (HCC)    Carcinoid tumor of stomach (HCC)    Cataract    Chest pain    Myoview  12/15 no ischemia.   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 09/05/2006   Qualifier: Diagnosis of  By: Wilhemina RMA, Lucy     Diabetic peripheral neuropathy (HCC) 10/29/2013   Encounter for preventative adult health care exam with abnormal findings 09/14/2013   Esophageal reflux    Gastric polyp    Fundic Gland   Gastroparesis    Headache(784.0)    Heart murmur    Echocardiogram 2/11: EF 60-65%, mild LAE, grade 1 diastolic dysfunction, aortic valve sclerosis, mean gradient 9 mm of mercury, PASP 34   Hematuria 03/16/2016   Iron  deficiency anemia, unspecified    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   Leg swelling    bilateral   Neck pain 04/22/2015   PONV (postoperative nausea and vomiting)    pt states body temperature drops every time she has anesthesia; pt states only needs small amount of anesthesia   PSVT (paroxysmal supraventricular tachycardia)     Pure hypercholesterolemia    Recurrent UTI 01/11/2016   Renal insufficiency 06/18/2019   pt states  L kidney function very low-functions at about 20%   Stroke [facility])    tia, 2014   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   Type II or unspecified type diabetes mellitus without mention of complication, not stated as uncontrolled    Unspecified essential hypertension    Unspecified hereditary and idiopathic peripheral neuropathy 10/29/2013   Vitamin D  deficiency    Past Surgical History:  Procedure Laterality Date   BREAST BIOPSY Right 06/13/2022   US  RT BREAST BX W LOC DEV 1ST LESION IMG BX SPEC US  GUIDE 06/13/2022 [facility] MAMMOGRAPHY   BREAST LUMPECTOMY Right 07/31/2022   Procedure: RIGHT BREAST LUMPECTOMY;  Surgeon: Vernetta Berg, MD;  Location: [facility];  Service: General;  Laterality: Right;   CHOLECYSTECTOMY  1993   COLONOSCOPY  11/11/2010   diverticulosis   DILATATION & CURRETTAGE/HYSTEROSCOPY WITH RESECTOCOPE N/A 02/25/2013   Procedure: Attempted hysteroscopy with uterine perforation;  Surgeon: Bobie FORBES Crown de Charlynn FORBES Cary, MD;  Location: [facility];  Service: Gynecology;  Laterality: N/A;   ESOPHAGOGASTRODUODENOSCOPY  08/29/2010; 09/15/2010   Carcinoid tumor less than 1 cm in July 2012 not  seen in August 2012 , gastritis, fundic gland polyps   ESOPHAGOGASTRODUODENOSCOPY  05/16/2011   ESOPHAGOGASTRODUODENOSCOPY  06/14/2012   EUS  12/15/2010   Procedure: UPPER ENDOSCOPIC ULTRASOUND (EUS) LINEAR;  Surgeon: Toribio Cedar, MD;  Location: [facility];  Service: Endoscopy;  Laterality: N/A;   EYE SURGERY Bilateral    Bi lateral cateracts and bi lateral laser   LAPAROSCOPY N/A 02/25/2013   Procedure: Cystoscopy and laparoscopy with fulguration of uterine serosa;  Surgeon: Bobie FORBES Crown de Charlynn FORBES Cary, MD;  Location: [facility];  Service: Gynecology;  Laterality: N/A;   TONSILLECTOMY     Patient Active Problem List   Diagnosis Date Noted   Acute pain of right knee 11/20/2023    Precordial chest pain 12/24/2022   Carcinoma of breast upper outer quadrant, right (HCC) 07/04/2022   SVT (supraventricular tachycardia) 06/21/2022   Left-sided weakness 04/02/2022   Unsteady gait 04/02/2022   Oral lesion 03/08/2021   Sun-damaged skin 03/08/2021   Thiamine  deficiency 11/01/2020   Trigeminal neuralgia 08/21/2020   Pedal edema 08/21/2020   Chronic left-sided back pain 03/28/2020   Nocturia 03/28/2020   Left-sided headache 03/28/2020   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   Renal insufficiency 06/18/2019   Educated about COVID-19 virus infection 05/15/2019   Atrophic vaginitis 01/20/2019   Stenosis of carotid artery 11/12/2018   Anxiety 10/13/2018   Referred otalgia of left ear 04/23/2018   Chronic throat clearing 04/23/2018   Sinusitis 10/11/2017   Headache 07/12/2017   Dizzy spells 11/21/2016   Constipation 11/21/2016   Nonrheumatic aortic valve stenosis 05/23/2016   Aortic atherosclerosis 05/23/2016   Hematuria 03/16/2016   Recurrent UTI 01/11/2016   Pain of upper abdomen 06/06/2015   Neck pain 04/22/2015   Ear pain 02/28/2015   Abnormal urine 11/21/2014   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   Anemia 06/08/2014   RLS (restless legs syndrome) 11/23/2013   Diabetic peripheral neuropathy (HCC) 10/29/2013   Left-sided thoracic back pain 10/06/2013   Encounter for preventative adult health care exam with abnormal findings 09/14/2013   Iron  deficiency anemia    Status post laparoscopy 02/25/2013   Hyponatremia 01/09/2013   GERD (gastroesophageal reflux disease) 01/09/2013   Amaurosis fugax of left eye 10/16/2012   Low back pain 06/03/2012   Vitamin D  deficiency 03/11/2012   Bilateral hand pain 10/20/2011   Encounter for long-term (current) use of other medications 10/20/2011   IBS (irritable bowel syndrome) 08/14/2011   TIA (transient ischemic attack) 02/10/2011   Abnormal brain CT 01/19/2011   Allergic rhinitis 10/01/2010   Carcinoid  tumor of stomach- history of 09/29/2010   Preventative health care 07/15/2010   FUNDIC GLAND POLYPS OF STOMACH 03/18/2010   Abdominal pain in female 03/18/2010   Left hip pain 03/17/2009   SYSTOLIC MURMUR 03/02/2009   Paroxysmal supraventricular tachycardia 01/12/2009   PLANTAR FASCIITIS 06/08/2008   CHEST PAIN 05/18/2008   Gastroparesis 12/18/2007   HYPERCHOLESTEROLEMIA 06/11/2007   Type 2 diabetes mellitus with obesity 09/05/2006   Essential hypertension 09/05/2006    PCP: Domenica Harlene LABOR, MD   REFERRING PROVIDER: Charles Redell LABOR, DO  REFERRING DIAG: M17.11 (ICD-10-CM) - Primary osteoarthritis of right knee   THERAPY DIAG:  Acute pain of right knee  Muscle weakness (generalized)  Stiffness of right knee, not elsewhere classified  Other abnormalities of gait and mobility  Unsteadiness on feet  RATIONALE FOR EVALUATION AND TREATMENT: Rehabilitation  ONSET DATE: early October  NEXT MD VISIT: TBD   SUBJECTIVE:  SUBJECTIVE STATEMENT: Patient reports pain in the R knee today is 7/10.   States she can't wear her knee brace because it marks her skin.   States she is feeling some numbness on the R lateral thigh and knee area.  Relates that her pain is more the lateral knee and thigh now whereas originally she had more anterior knee/joint pain.     EVAL: Pt reports on Sunday in early October she was out for dinner with her family and the next day her knee was swollen and she could barely put weight on it.  She waited about 2 weeks before going to see the doctor.  MD drew some fluid off and gave her a cortisone shot which helped for ~1 week but now the pain is back and she feels crippled. Pain prevents her from walking much or going out.  Difficulty putting on her clothes and shoes.   She also notes a burning feeling in her knee and anterior leg.  She has variable swelling in her knee - better today after taking lasix  yesterday.  She states she has to use a cane at home but did not bring it with her to PT today.  She states now her R hip and back are also hurting due to limping for her knee.  Pain prevents her from sleeping, esp on her sides, which is her preferred sleeping position.  PAIN: Are you having pain? Yes: NPRS scale: 7-8/10  Pain location: R knee, lower thigh and upper lower leg  Pain description: sharp  Aggravating factors: walking, prolonged standing, lower body dressing  Relieving factors: Voltaren  gel, Tylenol , ice - nothing give much relief  PERTINENT HISTORY:  Extensive PMH including chronic back and neck pain, OA, osteoporosis, DM-II, HTN, h/o TIA, diabetic peripheral neuropathy, PVD, headaches, sleep dysfunction, anxiety, R breast cancer s/p lumpectomy 07/31/22, SVT (refer to above problem list and past medical/surgical history for full PMH)   PRECAUTIONS: Fall  RED FLAGS: None  WEIGHT BEARING RESTRICTIONS: No  FALLS:  Has patient fallen in last 6 months? No  LIVING ENVIRONMENT: Lives with: lives alone Lives in: House Stairs: No Has following equipment at home: Single point cane, shower chair, and Grab bars  OCCUPATION: Retired  PLOF: Independent, Needs assistance with homemaking, and Leisure: read and play card games on her phone, traveling, and housekeeping/cleaning service ~1x/month   PATIENT GOALS: Just to walk freely with no pain. Be able to climb stairs.   OBJECTIVE: (objective measures completed at initial evaluation unless otherwise dated)  DIAGNOSTIC FINDINGS:  11/27/23 - DG RIGHT KNEE - COMPLETE 4+ VIEW CLINICAL DATA:  Right knee pain. Primary osteoarthritis of right knee. Acute knee pain.   FINDINGS: The alignment and joint spaces are preserved. Mild tricompartmental peripheral spurring and spurring of the tibial spines.  Small knee joint effusion. No fracture, erosion, or focal bone abnormality. Frontal view of the left knee included for comparison demonstrates tibiofemoral osteoarthritis.   IMPRESSION: Mild tricompartmental osteoarthritis of the right knee. Small knee joint effusion.  PATIENT SURVEYS:  LEFS  Extreme difficulty/unable (0), Quite a bit of difficulty (1), Moderate difficulty (2), Little difficulty (3), No difficulty (4) Survey date:  01/09/24  02/04/24  Any of your usual work, housework or school activities 0 1  2. Usual hobbies, recreational or sporting activities 0 0  3. Getting into/out of the bath 1 1  4. Walking between rooms 1 3  5. Putting on socks/shoes 0 0  6. Squatting  0 0  7.  Lifting an object, like a bag of groceries from the floor 0 1  8. Performing light activities around your home 0 1  9. Performing heavy activities around your home 0 0  10. Getting into/out of a car 0 0  11. Walking 2 blocks 0 0  12. Walking 1 mile 0 0  13. Going up/down 10 stairs (1 flight) 0 0  14. Standing for 1 hour 0 1  15.  sitting for 1 hour 0 4  16. Running on even ground 0 0  17. Running on uneven ground 0 0  18. Making sharp turns while running fast 0 0  19. Hopping  0 0  20. Rolling over in bed 0 1  Score total:  2/80 13/80  Functional limitation: Severe Severe     COGNITION: Overall cognitive status: Within functional limits for tasks assessed    SENSATION: WFL Pt reports burning and itching in her knee at times  EDEMA:  Circumferential:   Mid patella: R = 46.0 cm L = 43.5 cm  6 cm above: R = 49.0 cm L = 45.0 cm  6 cm below: R = 43.0 cm L = 40.0 cm  POSTURE:  rounded shoulders, forward head, and weight shift left  PALPATION: Very TTP over patella and anterior knee - increased edema apparent with decreased patellar mobility and poor quad activation (especially VMO) with quad set  MUSCLE LENGTH: TBA next visit Hamstrings:  ITB:  Piriformis:  Hip flexors:   Quads:  Heelcord:   LOWER EXTREMITY ROM:  Active ROM Right eval Left eval R 02/04/24 RLE  Knee flexion 82 120 104 105  Knee extension 18 LAQ /  8 supported 0 11 LAQ /  2 supported -2 to 3 degrees in supine  (Blank rows = not tested)  LOWER EXTREMITY MMT: (Tested in sitting on eval)  MMT Right eval Left eval  Hip flexion 3 3+  Hip extension 2 3-  Hip abduction 3+ 4-  Hip adduction 3+ 4-  Hip internal rotation    Hip external rotation    Knee flexion 3+ 4-  Knee extension 3- 4  Ankle dorsiflexion 3+ 4-  Ankle plantarflexion    Ankle inversion    Ankle eversion     (Blank rows = not tested)  FUNCTIONAL TESTS: 01/16/24 5 times sit to stand: 47.84 sec  Timed up and go (TUG): 44.75 sec with SPC  10 meter walk test: 54.47 sec with SPC  Gait speed = 0.60 ft/sec with SPC  GAIT: Distance walked: 50' Assistive device utilized: Single point cane and None Level of assistance: SBA Gait pattern: step to pattern, decreased step length- Left, decreased stride length, decreased hip/knee flexion- Right, antalgic, and lateral lean- Left Comments: Increased instability evident with patient reporting she uses SPC at home but did not bring it with her to PT   TODAY'S TREATMENT:  02/06/24 THERAPEUTIC EXERCISE: To improve strength, endurance, and ROM.  Demonstration, verbal and tactile cues throughout for technique.  NuStep - L3 x 6' (UE/LE)  Standing hip abd 2# x 15 RLE Standing hip ext x2# x 15 RLE Standing knee flexion 2# x 15 RLE Seated knee flexion GTB x 2/10 RLE Seated hip abd/ER GTB x 20 BLE Supine bent knee fallouts GTB x 20 BLE Supine SLR ham stretch w/ PT assist x 1' x 3 RLE Supine cross over piriformis stretch x 1' x 3 RLE SL clam x 2/10 RLE Circumference measures at mid patella:  L knee =  42.5 cm;   R knee = 44 cm  NEUROMUSCULAR RE-EDUCATION: To improve strength and balance. Sidestepping GTB  to knees x 2 laps at counter  MANUAL THERAPY: To promote reduced pain  utilizing myofascial release. MFR to R lateral hamstrings in SLR stretch position; MFR to R distal IT in sidelying via knuckle massage  02/04/2024  THERAPEUTIC EXERCISE: To improve strength, endurance, and ROM.  Demonstration, verbal and tactile cues throughout for technique.  NuStep - L2 x 6' (UE/LE)  SELF CARE:   Provided instruction in application of Galvaran Knee Brace ([email or web address]) which patient's sister recommended to her and she brought with to the therapy session.  Brace seemed a little snug and in the short trial of wearing it during therapy session seem to create some friction irritation along the edges of the brace and the seems with patient not really noting any difference in knee pain while wearing the brace, therefore recommended that she would probably be better off not using the brace.  MANUAL THERAPY: To promote normalized muscle tension, improved flexibility, improved joint mobility, increased ROM, and reduced pain utilizing connective tissue massage, therapeutic massage, manual TP therapy, and percussion massage with massage gun.  STM/DTM and percussion massage with massage gun to R glutes, piriformis, ITB, lateral quads and hamstrings  THERAPEUTIC ACTIVITIES: To improve functional performance.  Demonstration, verbal and tactile cues throughout for technique. LEFS: 13 / 80 = 16.3 % R knee ROM assessment - see ROM table above   01/29/24: Supine for AP jt mobs L prox tibia with thigh over bolster, 2 bouts 45 sec each, gentle, to improve R knee flexion motion  Mob with movement, lat glides R hip while assisting with R hip IR/ER and flex Side lying L with R thigh over 6 roll, for theragun massage R lateral hip musculature Supine with bolster under knees , manually resisted R hip IR, and then manually resisted R hip IR , 15 reps each  Ankle pumps with theraband for R ankle plantarflexion motion.   01/22/24 Nustep L1x48min UE/LE Seated R LE PF GTB X 10 Seated hip  ABD X 10 B Seated ball squeeze x 10  Seated ball squeeze + LAQ x 10 Seated R hamstring stretch 2x30 sec Seated R adductor stretch 2x30 sec Seated R foot on 9' stool piriformis stretch 2x30 sec   01/18/24 THERAPEUTIC EXERCISE: To improve strength, endurance, ROM, and flexibility.  Demonstration, verbal and tactile cues throughout for technique.  NuStep - L3 x 6' (UE/LE) Seated hip ADD isometric ball squeeze 10 x 5 Seated R hip adductor stretch 2 x 30 Seated hip ADD ball squeeze + R LAQ x 10 Seated hip hinge R HS stretch 2 x 30 Seated R knee flexion/extension heel slides on slider 2 x 10 Supine manual L hip adductor stretch 2 x 30 Supine manual L HS stretch 2 x 30 Supine manual L ITB stretch 2 x 30 Supine R quad set into towel roll 10 x 5  MANUAL THERAPY: To promote normalized muscle tension, improved flexibility, improved joint mobility, increased ROM, reduced pain, and reduced edema utilizing joint mobilization, connective tissue massage, therapeutic massage, manual TP therapy, and kinesiotaping. Kinesiotaping to R knee (blue Kinesiotex Classic) - chondromalacia pattern with slight increased stretch on lateral strip to promote medial patellar glide R patellar mobilization all directions - very limited mobility with some increased pain reported at points of PT pressure STM/DTM and manual TPR to R hip adductors, quads and ITB   01/16/24  THERAPEUTIC ACTIVITIES:  To improve functional performance.  Demonstration, verbal and tactile cues throughout for technique. 5xSTS = 47.84 sec TUG = 44.75 sec with SPC = 54.47 sec with SPC Gait speed = 0.60 ft/sec with SPC  GAIT TRAINING: To normalize gait pattern and improve safety with SPC. 85' with SPC and SBA of PT - repeated verbal and visual cues/demonstration for step through pattern and coordination of SPC with R LE, however patient continues to demonstrate step to pattern  THERAPEUTIC EXERCISE: To improve strength, ROM, and  flexibility.  Demonstration, verbal and tactile cues throughout for technique. Supine quad set on towel roll 10 x 5 Supine hip ABD/ADD x 10 Manual HS stretch by PT 2 x 30 Mod thomas quad/hip flexor stretch 2 x 30-60 Attempted R hip ADD butterfly stretch but deferred d/t increased pain  B HS curl/AAROM R knee flexion with heels on peanut ball and strap assist for R LE 2 x 10  MANUAL THERAPY: To promote normalized muscle tension, improved flexibility, improved joint mobility, increased ROM, and reduced pain utilizing joint mobilization, connective tissue massage, therapeutic massage, and kinesiotaping.  STM and gentle IASTM with foam roller to R quads, hip adductors and ITB R patellar mobilization all directions - very limited mobility however able to reduce pain with heel slides when PT providing medial patellar glide Kinesiotaping to R knee (sensitive skin tape - light blue Kinesiotex Gold) - chondromalacia pattern with slight increased stretch on lateral strip to promote medial patellar glide  MODALITIES:  Ice pack to R knee x 10' in sitting with LE elevated on 9 stool to reduce post-exercise pain and edema   01/09/24 - Eval SELF CARE:   Reviewed eval findings and role of PT in addressing identified deficits as well as instruction in initial HEP (see below).  Provided instruction in gait training with SPC in L hand to offset R knee.  Patient only able to demonstrate step to pattern despite repeated verbal cues and demonstration of step through pattern.  Further training indicated next visit. Discussed taking her lasix  more frequently (prescribed for daily PRN) to help with LE edema as she notes slight improvement in LE edema and knee pain after she takes the lasix .  Also explained that her potassium supplement should be taken with the lasix  to offset the loss of potassium as a result of the lasix .    PATIENT EDUCATION:  Education details: updated HEP and provided handouts    Person  educated: Patient Education method: Explanation, Demonstration, and Verbal cues Education comprehension: verbalized understanding and needs further education  HOME EXERCISE PROGRAM: Access Code: [email or web address] URL: [email or web address] Date: 02/06/2024 Prepared by: Garnette Montclair  Exercises - Supine Quad Set on Towel Roll  - 2-3 x daily - 7 x weekly - 2 sets - 10 reps - 5 sec hold - Seated Heel Slide  - 2-3 x daily - 7 x weekly - 2 sets - 10 reps - 3 sec hold - Seated Hip Adduction Isometrics with Ball  - 2 x daily - 7 x weekly - 2 sets - 10 reps - 3-5 sec hold - Seated Long Arc Quad with Hip Adduction  - 2 x daily - 7 x weekly - 2 sets - 10 reps - 3 sec hold - Seated Hip Adductor Stretch  - 2 x daily - 7 x weekly - 3 reps - 30 sec hold - Seated Hamstring Stretch  - 2 x daily - 7 x weekly - 3 reps - 30 sec hold - Clamshell  -  1 x daily - 7 x weekly - 1 sets - 10 reps - Seated Hip Abduction with Resistance  - 1 x daily - 7 x weekly - 2 sets - 10 reps - Supine Piriformis Stretch  - 1 x daily - 7 x weekly - 1 sets - 2 reps - 1 min hold - Seated Hamstring Curl with Anchored Resistance  - 1 x daily - 7 x weekly - 2 sets - 10 reps  ASSESSMENT:  CLINICAL IMPRESSION: Patient is having less pain in the R knee joint itself and more in the posterolateral structures per her report.   She responds well to stretching and MFR today.   R knee remains more swollen than the L by 1.5 cm which is improved over her initial visit and she is pleased with this.   She is using her cane as we have recommended and comes to clinic with it.   She is advised to continue using cane to reduce fall risk.   Focused today more on hip abductor strengthening and hamstring/IT band MFR and stretching.   She is progressing to goals.    Continue per POC  EVAL: Latarra N Tith is a 83 y.o. female who was referred to physical therapy for evaluation and treatment for acute R knee pain secondary to osteoarthritis.   Patient reports onset of R knee pain beginning in early October without known injury or precipitating event, other than having gone out to dinner with her family the night before.  Pain is worse with any standing or walking, as well as lower body dressing.  Patient has deficits in R knee ROM, B LE flexibility, B LE strength, abnormal posture, antalgic gait pattern with evidence of gait instability, and TTP with abnormal muscle tension which are interfering with ADLs and are impacting quality of life.  On LEFS patient scored 2/80 demonstrating severe functional limitation.  Further standardized balance testing to be completed next visit.  Ladelle will benefit from skilled PT to address above deficits to improve mobility and activity tolerance with decreased pain interference.  OBJECTIVE IMPAIRMENTS: Abnormal gait, decreased activity tolerance, decreased balance, decreased endurance, decreased knowledge of condition, decreased knowledge of use of DME, decreased mobility, difficulty walking, decreased ROM, decreased strength, decreased safety awareness, increased edema, increased fascial restrictions, impaired perceived functional ability, increased muscle spasms, impaired flexibility, improper body mechanics, postural dysfunction, and pain.   ACTIVITY LIMITATIONS: carrying, lifting, bending, sitting, standing, squatting, sleeping, stairs, transfers, bed mobility, bathing, dressing, and locomotion level  PARTICIPATION LIMITATIONS: meal prep, cleaning, laundry, driving, shopping, community activity, and church  PERSONAL FACTORS: Age, Behavior pattern, Fitness, Past/current experiences, Time since onset of injury/illness/exacerbation, and 3+ comorbidities: Extensive PMH including chronic back and neck pain, OA, osteoporosis, DM-II, HTN, h/o TIA, diabetic peripheral neuropathy, PVD, headaches, sleep dysfunction, anxiety, R breast cancer s/p lumpectomy 07/31/22, SVT  are also affecting patient's functional outcome.    REHAB POTENTIAL: Good  CLINICAL DECISION MAKING: Evolving/moderate complexity  EVALUATION COMPLEXITY: Moderate   GOALS: Goals reviewed with patient? Yes  SHORT TERM GOALS: Target date: 02/20/2024  Patient will be independent with initial HEP. Baseline: HEP initiated on eval 01/16/24 - Limited review today 01/18/24 - full HEP review with slight modification/update Goal status: IN PROGRESS - 02/04/24 - Pt denies concerns, but not formally reviewed  2.  Patient will report at least 25% improvement in R knee pain to improve QOL. Baseline: 10/10 Goal status: MET - 02/04/24 - 30-40% improvement in R knee pain  3.  Patient will demonstrate improved R knee AROM to >/= 10-95 to allow for normal gait and stair mechanics. Baseline: Refer to above LE ROM table - R knee AROM 18-82, with 8 extension lacking when knee supported  Goal status: PARTIALLY MET - 02/04/24 - R knee AROM 11-104, with 2 lacking in supported extension  4.  Complete standardized balance testing and update POC/goals as indicated. Baseline:  Goal status: IN PROGRESS - 01/16/24 - 5xSTS, TUG and completed   LONG TERM GOALS: Target date: 04/02/2024  Patient will be independent with advanced/ongoing HEP to improve outcomes and carryover.  Baseline: 02/06/24 advanced and provided written handouts on new exercises Goal status: IN PROGRESS  2.  Patient will report at least 50% improvement in R knee pain to improve QOL. Baseline: 10/10 02/06/24:  7/10 Goal status: IN PROGRESS  3.  Patient will demonstrate improved R knee AROM to >/= 5-110 to allow for normal gait and stair mechanics. Baseline: Refer to above LE ROM table - R knee AROM 18-82, with 8 Extension lacking when knee supported  02/06/24:  see updated tables Goal status: IN PROGRESS  4.  Patient will demonstrate improved B LE strength to >/= 4 to 4+/5 for improved stability and ease of mobility. Baseline: Refer to above LE MMT table Goal status:  INITIAL  5.  Patient will be able to ambulate 600' with LRAD and normal gait pattern without increased pain to access community.  Baseline: Antalgic step to pattern with SPC on L, worsening antalgic gait pattern without AD Goal status: INITIAL  6. Patient will report >/= 30/80 on LEFS (MCID = 9 pts) to demonstrate improved functional ability. Baseline: 2 / 80 = 2.5 % Goal status: IN PROGRESS - 02/04/24 - 13 / 80 = 16.3 %  7.  Patient will demonstrate at least 19/24 on DGI to decrease risk of falls. Baseline: NT on eval, 21/24 as of DC from PT on 07/09/23 Goal status: INITIAL   8.  Patient will improve 5x STS time to </= 25 seconds to demonstrate improved functional strength and transfer efficiency. Baseline: 47.84 sec - 01/16/24, 22.59 sec as of DC from PT on 07/09/23 Goal status: INITIAL   9.  Patient will demonstrate decreased TUG time to </= 18 sec to decrease risk for falls with transitional mobility. Baseline: 01/16/24 - 44.75 sec with SPC, 13.5 sec w/o AD as of 05/03/23 Goal status: INITIAL  10.  Patient will demonstrate gait speed of >/= 1.81 ft/sec to be a safe limited community ambulator with decreased risk for recurrent falls.  Baseline: 01/16/24 - 0.60 ft/sec with SPC, 2.78 ft/sec w/o AD as of 05/15/23 Goal status: INITIAL     PLAN:  PT FREQUENCY: 2x/week  PT DURATION: 12 weeks  PLANNED INTERVENTIONS: 97164- PT Re-evaluation, 97750- Physical Performance Testing, 97110-Therapeutic exercises, 97530- Therapeutic activity, 97112- Neuromuscular re-education, 97535- Self Care, [id number]- Manual therapy, [phone]- Gait training, [phone]- Aquatic Therapy, [phone]- Electrical stimulation (unattended), 97016- Vasopneumatic device, [id number]- Ultrasound, [id number]- Ionotophoresis 4mg /ml Dexamethasone , 79439 (1-2 muscles), 20561 (3+ muscles)- Dry Needling, Patient/Family education, Balance training, Stair training, Taping, Joint mobilization, DME instructions, Cryotherapy, and Moist heat  PLAN FOR NEXT  SESSION: complete standardized balance testing - DGI; gently progress R knee ROM; LE strengthening with emphasis on quads and hip stability; review and update HEP as indicated; manual therapy and/or modalities PRN for pain management;  Lakevia Perris, PT 02/06/2024, 2:22 PM     Date of referral: 12/20/2023 Referring provider: Charles Redell LABOR, DO Referring diagnosis? M17.11 (  ICD-10-CM) - Primary osteoarthritis of right knee Treatment diagnosis? (if different than referring diagnosis)  Acute pain of right knee  Muscle weakness (generalized)  Stiffness of right knee, not elsewhere classified  Other abnormalities of gait and mobility  Unsteadiness on feet  What was this (referring dx) caused by? Arthritis  Nature of Condition: Initial Onset (within last 3 months)   Laterality: Rt  Current Functional Measure Score: LEFS 13 / 80 = 16.3 %  Objective measurements identify impairments when they are compared to normal values, the uninvolved extremity, and prior level of function.  [x]  Yes  []  No  Objective assessment of functional ability: Moderate functional limitations   Briefly describe symptoms:   Doreen reports her R knee pain is much better today although pain rating only dropped from 9/10 last visit to 7-8/10 today.  She states her pain is no longer constant but still occurs on a frequent basis and still significantly impacts most daily activities.  R knee AROM improving to 11-104 with supported R knee extension down to 2, demonstrating significant gains from ROM observed at eval. LEFS demonstrating 11 point improvement from eval but still demonstrating severe functional limitation at 13/80.  Piya will benefit from continued skilled PT to address ongoing ROM, flexibility, abnormal muscle tension, strength and balance deficits to improve mobility and activity tolerance with decreased pain interference and decreased risk for falls.   How did symptoms start: Sudden  onset of pain and swelling in her R knee the day after she had been out with family  Average pain intensity:  Last 24 hours: 7/10  Past week: 7-9/10  How often does the pt experience symptoms? Frequently  How much have the symptoms interfered with usual daily activities? Quite a bit  How has condition changed since care began at this facility? Better  In general, how is the patients overall health? Fair  Onset date: early October   BACK PAIN (STarT Back Screening Tool) - (When applicable): N/A  Has your back pain spread down your leg(s) at sometime in the last 2 weeks? []  Yes   []  No Have you had pain in the shoulder or neck at sometime in the past 2 weeks? []  Yes   []  No Have you only walked short distances because of your back pain? []  Yes   []  No In the past 2 weeks, have you dressed more slowly than usual because of your back pain? []  Yes   []  No Do you think it is not really safe for person with a condition like yours to be physically active? []  Yes   []  No Have worrying thoughts been going through your mind a lot of the time? []  Yes   []  No Do you feel that your back pain is terrible and it is never going to get any better? []  Yes   []  No In general, have you stopped enjoying all the things you usually enjoy? []  Yes   []  No Overall, how bothersome has your back pain been in the last 2 weeks? []  Not at all   []  Slightly     []  Moderate   []  Very much     []  Extremely   "

## 2024-02-09 ENCOUNTER — Other Ambulatory Visit: Payer: Self-pay | Admitting: Family Medicine

## 2024-02-12 ENCOUNTER — Ambulatory Visit

## 2024-02-12 ENCOUNTER — Other Ambulatory Visit: Payer: Self-pay

## 2024-02-12 DIAGNOSIS — M6281 Muscle weakness (generalized): Secondary | ICD-10-CM | POA: Diagnosis present

## 2024-02-12 DIAGNOSIS — M25661 Stiffness of right knee, not elsewhere classified: Secondary | ICD-10-CM | POA: Diagnosis present

## 2024-02-12 DIAGNOSIS — M25561 Pain in right knee: Secondary | ICD-10-CM | POA: Diagnosis present

## 2024-02-12 DIAGNOSIS — R2689 Other abnormalities of gait and mobility: Secondary | ICD-10-CM | POA: Diagnosis present

## 2024-02-12 DIAGNOSIS — R2681 Unsteadiness on feet: Secondary | ICD-10-CM | POA: Insufficient documentation

## 2024-02-12 NOTE — Therapy (Signed)
 " OUTPATIENT PHYSICAL THERAPY TREATMENT   Patient Name: [name] MRN: [id number] DOB:10-10-1940, 84 y.o., female Today's Date: 02/12/2024   END OF SESSION:  PT End of Session - 02/12/24 1425     Visit Number 8    Date for Recertification  04/02/24    Authorization Type [employer]    Authorization Time Period 01/09/24 - 04/02/24    Authorization - Number of Visits 6    Progress Note Due on Visit 10    PT Start Time 1408    Activity Tolerance Patient tolerated treatment well    Behavior During Therapy [facility] for tasks assessed/performed          Past Medical History:  Diagnosis Date   Abdominal pain in female 03/18/2010   Qualifier: Diagnosis of  By: Avram MD, NOLIA Pitts E    Anemia 06/08/2014   Anxiety    Arthritis    Spinal Osteoarthritis   Breast cancer (HCC)    Carcinoid tumor of stomach (HCC)    Cataract    Chest pain    Myoview  12/15 no ischemia.   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 09/05/2006   Qualifier: Diagnosis of  By: Wilhemina RMA, Lucy     Diabetic peripheral neuropathy (HCC) 10/29/2013   Encounter for preventative adult health care exam with abnormal findings 09/14/2013   Esophageal reflux    Gastric polyp    Fundic Gland   Gastroparesis    Headache(784.0)    Heart murmur    Echocardiogram 2/11: EF 60-65%, mild LAE, grade 1 diastolic dysfunction, aortic valve sclerosis, mean gradient 9 mm of mercury, PASP 34   Hematuria 03/16/2016   Iron  deficiency anemia, unspecified    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   Leg swelling    bilateral   Neck pain 04/22/2015   PONV (postoperative nausea and vomiting)    pt states body temperature drops every time she has anesthesia; pt states only needs small amount of anesthesia   PSVT (paroxysmal supraventricular tachycardia)    Pure hypercholesterolemia    Recurrent UTI 01/11/2016   Renal insufficiency 06/18/2019   pt  states  L kidney function very low-functions at about 20%   Stroke [facility])    tia, 2014   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   Type II or unspecified type diabetes mellitus without mention of complication, not stated as uncontrolled    Unspecified essential hypertension    Unspecified hereditary and idiopathic peripheral neuropathy 10/29/2013   Vitamin D  deficiency    Past Surgical History:  Procedure Laterality Date   BREAST BIOPSY Right 06/13/2022   US  RT BREAST BX W LOC DEV 1ST LESION IMG BX SPEC US  GUIDE 06/13/2022 [facility] MAMMOGRAPHY   BREAST LUMPECTOMY Right 07/31/2022   Procedure: RIGHT BREAST LUMPECTOMY;  Surgeon: Vernetta Berg, MD;  Location: [facility];  Service: General;  Laterality: Right;   CHOLECYSTECTOMY  1993   COLONOSCOPY  11/11/2010   diverticulosis   DILATATION & CURRETTAGE/HYSTEROSCOPY WITH RESECTOCOPE N/A 02/25/2013   Procedure: Attempted hysteroscopy with uterine perforation;  Surgeon: Bobie FORBES Crown de Charlynn FORBES Cary, MD;  Location: [facility];  Service: Gynecology;  Laterality: N/A;   ESOPHAGOGASTRODUODENOSCOPY  08/29/2010; 09/15/2010   Carcinoid tumor less than 1 cm in July 2012 not seen in August 2012 , gastritis, fundic gland polyps   ESOPHAGOGASTRODUODENOSCOPY  05/16/2011   ESOPHAGOGASTRODUODENOSCOPY  06/14/2012   EUS  12/15/2010  Procedure: UPPER ENDOSCOPIC ULTRASOUND (EUS) LINEAR;  Surgeon: Toribio Cedar, MD;  Location: [facility];  Service: Endoscopy;  Laterality: N/A;   EYE SURGERY Bilateral    Bi lateral cateracts and bi lateral laser   LAPAROSCOPY N/A 02/25/2013   Procedure: Cystoscopy and laparoscopy with fulguration of uterine serosa;  Surgeon: Bobie FORBES Crown de Charlynn FORBES Cary, MD;  Location: [facility];  Service: Gynecology;  Laterality: N/A;   TONSILLECTOMY     Patient Active Problem List   Diagnosis Date Noted   Acute pain of right knee 11/20/2023   Precordial chest pain 12/24/2022   Carcinoma of breast upper outer quadrant, right (HCC) 07/04/2022    SVT (supraventricular tachycardia) 06/21/2022   Left-sided weakness 04/02/2022   Unsteady gait 04/02/2022   Oral lesion 03/08/2021   Sun-damaged skin 03/08/2021   Thiamine  deficiency 11/01/2020   Trigeminal neuralgia 08/21/2020   Pedal edema 08/21/2020   Chronic left-sided back pain 03/28/2020   Nocturia 03/28/2020   Left-sided headache 03/28/2020   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   Renal insufficiency 06/18/2019   Educated about COVID-19 virus infection 05/15/2019   Atrophic vaginitis 01/20/2019   Stenosis of carotid artery 11/12/2018   Anxiety 10/13/2018   Referred otalgia of left ear 04/23/2018   Chronic throat clearing 04/23/2018   Sinusitis 10/11/2017   Headache 07/12/2017   Dizzy spells 11/21/2016   Constipation 11/21/2016   Nonrheumatic aortic valve stenosis 05/23/2016   Aortic atherosclerosis 05/23/2016   Hematuria 03/16/2016   Recurrent UTI 01/11/2016   Pain of upper abdomen 06/06/2015   Neck pain 04/22/2015   Ear pain 02/28/2015   Abnormal urine 11/21/2014   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   Anemia 06/08/2014   RLS (restless legs syndrome) 11/23/2013   Diabetic peripheral neuropathy (HCC) 10/29/2013   Left-sided thoracic back pain 10/06/2013   Encounter for preventative adult health care exam with abnormal findings 09/14/2013   Iron  deficiency anemia    Status post laparoscopy 02/25/2013   Hyponatremia 01/09/2013   GERD (gastroesophageal reflux disease) 01/09/2013   Amaurosis fugax of left eye 10/16/2012   Low back pain 06/03/2012   Vitamin D  deficiency 03/11/2012   Bilateral hand pain 10/20/2011   Encounter for long-term (current) use of other medications 10/20/2011   IBS (irritable bowel syndrome) 08/14/2011   TIA (transient ischemic attack) 02/10/2011   Abnormal brain CT 01/19/2011   Allergic rhinitis 10/01/2010   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   Preventative health care 07/15/2010   FUNDIC GLAND POLYPS OF  STOMACH 03/18/2010   Abdominal pain in female 03/18/2010   Left hip pain 03/17/2009   SYSTOLIC MURMUR 03/02/2009   Paroxysmal supraventricular tachycardia 01/12/2009   PLANTAR FASCIITIS 06/08/2008   CHEST PAIN 05/18/2008   Gastroparesis 12/18/2007   HYPERCHOLESTEROLEMIA 06/11/2007   Type 2 diabetes mellitus with obesity 09/05/2006   Essential hypertension 09/05/2006    PCP: Domenica Harlene LABOR, MD   REFERRING PROVIDER: Charles Redell LABOR, DO  REFERRING DIAG: M17.11 (ICD-10-CM) - Primary osteoarthritis of right knee   THERAPY DIAG:  Muscle weakness (generalized)  Acute pain of right knee  Stiffness of right knee, not elsewhere classified  Other abnormalities of gait and mobility  Unsteadiness on feet  RATIONALE FOR EVALUATION AND TREATMENT: Rehabilitation  ONSET DATE: early October  NEXT MD VISIT: TBD   SUBJECTIVE:  SUBJECTIVE STATEMENT: Patient reports pain in the R knee today is 5/10.   Reports much improved pain and swelling R knee / leg     EVAL: Pt reports on Sunday in early October she was out for dinner with her family and the next day her knee was swollen and she could barely put weight on it.  She waited about 2 weeks before going to see the doctor.  MD drew some fluid off and gave her a cortisone shot which helped for ~1 week but now the pain is back and she feels crippled. Pain prevents her from walking much or going out.  Difficulty putting on her clothes and shoes.  She also notes a burning feeling in her knee and anterior leg.  She has variable swelling in her knee - better today after taking lasix  yesterday.  She states she has to use a cane at home but did not bring it with her to PT today.  She states now her R hip and back are also hurting due to limping for her knee.   Pain prevents her from sleeping, esp on her sides, which is her preferred sleeping position.  PAIN: Are you having pain? Yes: NPRS scale: 7-8/10  Pain location: R knee, lower thigh and upper lower leg  Pain description: sharp  Aggravating factors: walking, prolonged standing, lower body dressing  Relieving factors: Voltaren  gel, Tylenol , ice - nothing give much relief  PERTINENT HISTORY:  Extensive PMH including chronic back and neck pain, OA, osteoporosis, DM-II, HTN, h/o TIA, diabetic peripheral neuropathy, PVD, headaches, sleep dysfunction, anxiety, R breast cancer s/p lumpectomy 07/31/22, SVT (refer to above problem list and past medical/surgical history for full PMH)   PRECAUTIONS: Fall  RED FLAGS: None  WEIGHT BEARING RESTRICTIONS: No  FALLS:  Has patient fallen in last 6 months? No  LIVING ENVIRONMENT: Lives with: lives alone Lives in: House Stairs: No Has following equipment at home: Single point cane, shower chair, and Grab bars  OCCUPATION: Retired  PLOF: Independent, Needs assistance with homemaking, and Leisure: read and play card games on her phone, traveling, and housekeeping/cleaning service ~1x/month   PATIENT GOALS: Just to walk freely with no pain. Be able to climb stairs.   OBJECTIVE: (objective measures completed at initial evaluation unless otherwise dated)  DIAGNOSTIC FINDINGS:  11/27/23 - DG RIGHT KNEE - COMPLETE 4+ VIEW CLINICAL DATA:  Right knee pain. Primary osteoarthritis of right knee. Acute knee pain.   FINDINGS: The alignment and joint spaces are preserved. Mild tricompartmental peripheral spurring and spurring of the tibial spines. Small knee joint effusion. No fracture, erosion, or focal bone abnormality. Frontal view of the left knee included for comparison demonstrates tibiofemoral osteoarthritis.   IMPRESSION: Mild tricompartmental osteoarthritis of the right knee. Small knee joint effusion.  PATIENT SURVEYS:  LEFS  Extreme  difficulty/unable (0), Quite a bit of difficulty (1), Moderate difficulty (2), Little difficulty (3), No difficulty (4) Survey date:  01/09/24  02/04/24  Any of your usual work, housework or school activities 0 1  2. Usual hobbies, recreational or sporting activities 0 0  3. Getting into/out of the bath 1 1  4. Walking between rooms 1 3  5. Putting on socks/shoes 0 0  6. Squatting  0 0  7. Lifting an object, like a bag of groceries from the floor 0 1  8. Performing light activities around your home 0 1  9. Performing heavy activities around your home 0 0  10. Getting into/out of a  car 0 0  11. Walking 2 blocks 0 0  12. Walking 1 mile 0 0  13. Going up/down 10 stairs (1 flight) 0 0  14. Standing for 1 hour 0 1  15.  sitting for 1 hour 0 4  16. Running on even ground 0 0  17. Running on uneven ground 0 0  18. Making sharp turns while running fast 0 0  19. Hopping  0 0  20. Rolling over in bed 0 1  Score total:  2/80 13/80  Functional limitation: Severe Severe     COGNITION: Overall cognitive status: Within functional limits for tasks assessed    SENSATION: WFL Pt reports burning and itching in her knee at times  EDEMA:  Circumferential:   Mid patella: R = 46.0 cm L = 43.5 cm  6 cm above: R = 49.0 cm L = 45.0 cm  6 cm below: R = 43.0 cm L = 40.0 cm  POSTURE:  rounded shoulders, forward head, and weight shift left  PALPATION: Very TTP over patella and anterior knee - increased edema apparent with decreased patellar mobility and poor quad activation (especially VMO) with quad set  MUSCLE LENGTH: TBA next visit Hamstrings:  ITB:  Piriformis:  Hip flexors:  Quads:  Heelcord:   LOWER EXTREMITY ROM:  Active ROM Right eval Left eval R 02/04/24 RLE  Knee flexion 82 120 104 105  Knee extension 18 LAQ /  8 supported 0 11 LAQ /  2 supported -2 to 3 degrees in supine  (Blank rows = not tested)  LOWER EXTREMITY MMT: (Tested in sitting on eval)  MMT Right eval  Left eval  Hip flexion 3 3+  Hip extension 2 3-  Hip abduction 3+ 4-  Hip adduction 3+ 4-  Hip internal rotation    Hip external rotation    Knee flexion 3+ 4-  Knee extension 3- 4  Ankle dorsiflexion 3+ 4-  Ankle plantarflexion    Ankle inversion    Ankle eversion     (Blank rows = not tested)  FUNCTIONAL TESTS: 01/16/24 5 times sit to stand: 47.84 sec  Timed up and go (TUG): 44.75 sec with SPC  10 meter walk test: 54.47 sec with SPC  Gait speed = 0.60 ft/sec with SPC  GAIT: Distance walked: 50' Assistive device utilized: Single point cane and None Level of assistance: SBA Gait pattern: step to pattern, decreased step length- Left, decreased stride length, decreased hip/knee flexion- Right, antalgic, and lateral lean- Left Comments: Increased instability evident with patient reporting she uses SPC at home but did not bring it with her to PT   TODAY'S TREATMENT:  02/12/24: somewhat abbreviated session as pt  7 min late: Supine , Knees over bolster, deep tissue massage R distal lateral quads , Itband Nustep L 4, x 5 min  Standing with 2 # cuff wts ankles for alt hip abd, alt hip ext ,alt hamstring curls, 2 x 10 each Seated marches alt with red t band 2 x 10 Seated SLR 2 x 10 each leg   02/06/24 THERAPEUTIC EXERCISE: To improve strength, endurance, and ROM.  Demonstration, verbal and tactile cues throughout for technique.  NuStep - L3 x 6' (UE/LE)  Standing hip abd 2# x 15 RLE Standing hip ext x2# x 15 RLE Standing knee flexion 2# x 15 RLE Seated knee flexion GTB x 2/10 RLE Seated hip abd/ER GTB x 20 BLE Supine bent knee fallouts GTB x 20 BLE Supine SLR ham stretch  w/ PT assist x 1' x 3 RLE Supine cross over piriformis stretch x 1' x 3 RLE SL clam x 2/10 RLE Circumference measures at mid patella:  L knee = 42.5 cm;   R knee = 44 cm  NEUROMUSCULAR RE-EDUCATION: To improve strength and balance. Sidestepping GTB  to knees x 2 laps at counter  MANUAL THERAPY: To promote  reduced pain utilizing myofascial release. MFR to R lateral hamstrings in SLR stretch position; MFR to R distal IT in sidelying via knuckle massage  02/04/2024  THERAPEUTIC EXERCISE: To improve strength, endurance, and ROM.  Demonstration, verbal and tactile cues throughout for technique.  NuStep - L2 x 6' (UE/LE)  SELF CARE:   Provided instruction in application of Galvaran Knee Brace ([email or web address]://[email or web address]) which patient's sister recommended to her and she brought with to the therapy session.  Brace seemed a little snug and in the short trial of wearing it during therapy session seem to create some friction irritation along the edges of the brace and the seems with patient not really noting any difference in knee pain while wearing the brace, therefore recommended that she would probably be better off not using the brace.  MANUAL THERAPY: To promote normalized muscle tension, improved flexibility, improved joint mobility, increased ROM, and reduced pain utilizing connective tissue massage, therapeutic massage, manual TP therapy, and percussion massage with massage gun.  STM/DTM and percussion massage with massage gun to R glutes, piriformis, ITB, lateral quads and hamstrings  THERAPEUTIC ACTIVITIES: To improve functional performance.  Demonstration, verbal and tactile cues throughout for technique. LEFS: 13 / 80 = 16.3 % R knee ROM assessment - see ROM table above   01/29/24: Supine for AP jt mobs L prox tibia with thigh over bolster, 2 bouts 45 sec each, gentle, to improve R knee flexion motion  Mob with movement, lat glides R hip while assisting with R hip IR/ER and flex Side lying L with R thigh over 6 roll, for theragun massage R lateral hip musculature Supine with bolster under knees , manually resisted R hip IR, and then manually resisted R hip IR , 15 reps each  Ankle pumps with theraband for R ankle plantarflexion motion.   01/22/24 Nustep L1x63min UE/LE Seated R LE PF GTB X  10 Seated hip ABD X 10 B Seated ball squeeze x 10  Seated ball squeeze + LAQ x 10 Seated R hamstring stretch 2x30 sec Seated R adductor stretch 2x30 sec Seated R foot on 9' stool piriformis stretch 2x30 sec   01/18/24 THERAPEUTIC EXERCISE: To improve strength, endurance, ROM, and flexibility.  Demonstration, verbal and tactile cues throughout for technique.  NuStep - L3 x 6' (UE/LE) Seated hip ADD isometric ball squeeze 10 x 5 Seated R hip adductor stretch 2 x 30 Seated hip ADD ball squeeze + R LAQ x 10 Seated hip hinge R HS stretch 2 x 30 Seated R knee flexion/extension heel slides on slider 2 x 10 Supine manual L hip adductor stretch 2 x 30 Supine manual L HS stretch 2 x 30 Supine manual L ITB stretch 2 x 30 Supine R quad set into towel roll 10 x 5  MANUAL THERAPY: To promote normalized muscle tension, improved flexibility, improved joint mobility, increased ROM, reduced pain, and reduced edema utilizing joint mobilization, connective tissue massage, therapeutic massage, manual TP therapy, and kinesiotaping. Kinesiotaping to R knee (blue Kinesiotex Classic) - chondromalacia pattern with slight increased stretch on lateral strip to promote medial patellar glide R patellar mobilization all  directions - very limited mobility with some increased pain reported at points of PT pressure STM/DTM and manual TPR to R hip adductors, quads and ITB   01/16/24  THERAPEUTIC ACTIVITIES: To improve functional performance.  Demonstration, verbal and tactile cues throughout for technique. 5xSTS = 47.84 sec TUG = 44.75 sec with SPC = 54.47 sec with SPC Gait speed = 0.60 ft/sec with SPC  GAIT TRAINING: To normalize gait pattern and improve safety with SPC. 56' with SPC and SBA of PT - repeated verbal and visual cues/demonstration for step through pattern and coordination of SPC with R LE, however patient continues to demonstrate step to pattern  THERAPEUTIC EXERCISE: To improve  strength, ROM, and flexibility.  Demonstration, verbal and tactile cues throughout for technique. Supine quad set on towel roll 10 x 5 Supine hip ABD/ADD x 10 Manual HS stretch by PT 2 x 30 Mod thomas quad/hip flexor stretch 2 x 30-60 Attempted R hip ADD butterfly stretch but deferred d/t increased pain  B HS curl/AAROM R knee flexion with heels on peanut ball and strap assist for R LE 2 x 10  MANUAL THERAPY: To promote normalized muscle tension, improved flexibility, improved joint mobility, increased ROM, and reduced pain utilizing joint mobilization, connective tissue massage, therapeutic massage, and kinesiotaping.  STM and gentle IASTM with foam roller to R quads, hip adductors and ITB R patellar mobilization all directions - very limited mobility however able to reduce pain with heel slides when PT providing medial patellar glide Kinesiotaping to R knee (sensitive skin tape - light blue Kinesiotex Gold) - chondromalacia pattern with slight increased stretch on lateral strip to promote medial patellar glide  MODALITIES:  Ice pack to R knee x 10' in sitting with LE elevated on 9 stool to reduce post-exercise pain and edema   01/09/24 - Eval SELF CARE:   Reviewed eval findings and role of PT in addressing identified deficits as well as instruction in initial HEP (see below).  Provided instruction in gait training with SPC in L hand to offset R knee.  Patient only able to demonstrate step to pattern despite repeated verbal cues and demonstration of step through pattern.  Further training indicated next visit. Discussed taking her lasix  more frequently (prescribed for daily PRN) to help with LE edema as she notes slight improvement in LE edema and knee pain after she takes the lasix .  Also explained that her potassium supplement should be taken with the lasix  to offset the loss of potassium as a result of the lasix .    PATIENT EDUCATION:  Education details: updated HEP and provided  handouts    Person educated: Patient Education method: Explanation, Demonstration, and Verbal cues Education comprehension: verbalized understanding and needs further education  HOME EXERCISE PROGRAM: Access Code: [email or web address] URL: [email or web address] Date: 02/06/2024 Prepared by: Garnette Montclair  Exercises - Supine Quad Set on Towel Roll  - 2-3 x daily - 7 x weekly - 2 sets - 10 reps - 5 sec hold - Seated Heel Slide  - 2-3 x daily - 7 x weekly - 2 sets - 10 reps - 3 sec hold - Seated Hip Adduction Isometrics with Ball  - 2 x daily - 7 x weekly - 2 sets - 10 reps - 3-5 sec hold - Seated Long Arc Quad with Hip Adduction  - 2 x daily - 7 x weekly - 2 sets - 10 reps - 3 sec hold - Seated Hip Adductor Stretch  - 2 x daily -  7 x weekly - 3 reps - 30 sec hold - Seated Hamstring Stretch  - 2 x daily - 7 x weekly - 3 reps - 30 sec hold - Clamshell  - 1 x daily - 7 x weekly - 1 sets - 10 reps - Seated Hip Abduction with Resistance  - 1 x daily - 7 x weekly - 2 sets - 10 reps - Supine Piriformis Stretch  - 1 x daily - 7 x weekly - 1 sets - 2 reps - 1 min hold - Seated Hamstring Curl with Anchored Resistance  - 1 x daily - 7 x weekly - 2 sets - 10 reps  ASSESSMENT:  CLINICAL IMPRESSION: Patient is having less pain in the R knee.  She was able to tolerate more intensive resistance training LE's with emphasis on B hip strength.  She is using her cane as we have recommended and comes to clinic with it.   She is advised to continue using cane to reduce fall risk.    She is progressing to goals.    Continue per POC  EVAL: [name] is a 84 y.o. female who was referred to physical therapy for evaluation and treatment for acute R knee pain secondary to osteoarthritis.  Patient reports onset of R knee pain beginning in early October without known injury or precipitating event, other than having gone out to dinner with her family the night before.  Pain is worse with any standing or  walking, as well as lower body dressing.  Patient has deficits in R knee ROM, B LE flexibility, B LE strength, abnormal posture, antalgic gait pattern with evidence of gait instability, and TTP with abnormal muscle tension which are interfering with ADLs and are impacting quality of life.  On LEFS patient scored 2/80 demonstrating severe functional limitation.  Further standardized balance testing to be completed next visit.  [name] will benefit from skilled PT to address above deficits to improve mobility and activity tolerance with decreased pain interference.  OBJECTIVE IMPAIRMENTS: Abnormal gait, decreased activity tolerance, decreased balance, decreased endurance, decreased knowledge of condition, decreased knowledge of use of DME, decreased mobility, difficulty walking, decreased ROM, decreased strength, decreased safety awareness, increased edema, increased fascial restrictions, impaired perceived functional ability, increased muscle spasms, impaired flexibility, improper body mechanics, postural dysfunction, and pain.   ACTIVITY LIMITATIONS: carrying, lifting, bending, sitting, standing, squatting, sleeping, stairs, transfers, bed mobility, bathing, dressing, and locomotion level  PARTICIPATION LIMITATIONS: meal prep, cleaning, laundry, driving, shopping, community activity, and church  PERSONAL FACTORS: Age, Behavior pattern, Fitness, Past/current experiences, Time since onset of injury/illness/exacerbation, and 3+ comorbidities: Extensive PMH including chronic back and neck pain, OA, osteoporosis, DM-II, HTN, h/o TIA, diabetic peripheral neuropathy, PVD, headaches, sleep dysfunction, anxiety, R breast cancer s/p lumpectomy 07/31/22, SVT  are also affecting patient's functional outcome.   REHAB POTENTIAL: Good  CLINICAL DECISION MAKING: Evolving/moderate complexity  EVALUATION COMPLEXITY: Moderate   GOALS: Goals reviewed with patient? Yes  SHORT TERM GOALS: Target date:  02/20/2024  Patient will be independent with initial HEP. Baseline: HEP initiated on eval 01/16/24 - Limited review today 01/18/24 - full HEP review with slight modification/update Goal status: IN PROGRESS - 02/04/24 - Pt denies concerns, but not formally reviewed  2.  Patient will report at least 25% improvement in R knee pain to improve QOL. Baseline: 10/10 Goal status: MET - 02/04/24 - 30-40% improvement in R knee pain  3.  Patient will demonstrate improved R knee AROM to >/= 10-95 to  allow for normal gait and stair mechanics. Baseline: Refer to above LE ROM table - R knee AROM 18-82, with 8 extension lacking when knee supported  Goal status: PARTIALLY MET - 02/04/24 - R knee AROM 11-104, with 2 lacking in supported extension  4.  Complete standardized balance testing and update POC/goals as indicated. Baseline:  Goal status: IN PROGRESS - 01/16/24 - 5xSTS, TUG and completed   LONG TERM GOALS: Target date: 04/02/2024  Patient will be independent with advanced/ongoing HEP to improve outcomes and carryover.  Baseline: 02/06/24 advanced and provided written handouts on new exercises Goal status: IN PROGRESS  2.  Patient will report at least 50% improvement in R knee pain to improve QOL. Baseline: 10/10 02/06/24:  7/10 Goal status: IN PROGRESS  3.  Patient will demonstrate improved R knee AROM to >/= 5-110 to allow for normal gait and stair mechanics. Baseline: Refer to above LE ROM table - R knee AROM 18-82, with 8 Extension lacking when knee supported  02/06/24:  see updated tables Goal status: IN PROGRESS  4.  Patient will demonstrate improved B LE strength to >/= 4 to 4+/5 for improved stability and ease of mobility. Baseline: Refer to above LE MMT table Goal status: INITIAL  5.  Patient will be able to ambulate 600' with LRAD and normal gait pattern without increased pain to access community.  Baseline: Antalgic step to pattern with SPC on L, worsening  antalgic gait pattern without AD Goal status: INITIAL  6. Patient will report >/= 30/80 on LEFS (MCID = 9 pts) to demonstrate improved functional ability. Baseline: 2 / 80 = 2.5 % Goal status: IN PROGRESS - 02/04/24 - 13 / 80 = 16.3 %  7.  Patient will demonstrate at least 19/24 on DGI to decrease risk of falls. Baseline: NT on eval, 21/24 as of DC from PT on 07/09/23 Goal status: INITIAL   8.  Patient will improve 5x STS time to </= 25 seconds to demonstrate improved functional strength and transfer efficiency. Baseline: 47.84 sec - 01/16/24, 22.59 sec as of DC from PT on 07/09/23 Goal status: INITIAL   9.  Patient will demonstrate decreased TUG time to </= 18 sec to decrease risk for falls with transitional mobility. Baseline: 01/16/24 - 44.75 sec with SPC, 13.5 sec w/o AD as of 05/03/23 Goal status: INITIAL  10.  Patient will demonstrate gait speed of >/= 1.81 ft/sec to be a safe limited community ambulator with decreased risk for recurrent falls.  Baseline: 01/16/24 - 0.60 ft/sec with SPC, 2.78 ft/sec w/o AD as of 05/15/23 Goal status: INITIAL     PLAN:  PT FREQUENCY: 2x/week  PT DURATION: 12 weeks  PLANNED INTERVENTIONS: 97164- PT Re-evaluation, 97750- Physical Performance Testing, 97110-Therapeutic exercises, 97530- Therapeutic activity, 97112- Neuromuscular re-education, 97535- Self Care, 02859- Manual therapy, [phone]- Gait training, [phone]- Aquatic Therapy, [phone]- Electrical stimulation (unattended), 97016- Vasopneumatic device, [id number]- Ultrasound, [id number]- Ionotophoresis 4mg /ml Dexamethasone , 79439 (1-2 muscles), 20561 (3+ muscles)- Dry Needling, Patient/Family education, Balance training, Stair training, Taping, Joint mobilization, DME instructions, Cryotherapy, and Moist heat  PLAN FOR NEXT SESSION: complete standardized balance testing - DGI; gently progress R knee ROM; LE strengthening with emphasis on quads and hip stability; review and update HEP as indicated; manual therapy and/or  modalities PRN for pain management;  [name], PT 02/12/2024, 5:05 PM     Date of referral: 12/20/2023 Referring provider: Charles Redell LABOR, DO Referring diagnosis? M17.11 (ICD-10-CM) - Primary osteoarthritis of right knee Treatment diagnosis? (  if different than referring diagnosis)  Muscle weakness (generalized)  Acute pain of right knee  Stiffness of right knee, not elsewhere classified  Other abnormalities of gait and mobility  Unsteadiness on feet  What was this (referring dx) caused by? Arthritis  Nature of Condition: Initial Onset (within last 3 months)   Laterality: Rt  Current Functional Measure Score: LEFS 13 / 80 = 16.3 %  Objective measurements identify impairments when they are compared to normal values, the uninvolved extremity, and prior level of function.  [x]  Yes  []  No  Objective assessment of functional ability: Moderate functional limitations   Briefly describe symptoms:   [name] reports her R knee pain is much better today although pain rating only dropped from 9/10 last visit to 7-8/10 today.  She states her pain is no longer constant but still occurs on a frequent basis and still significantly impacts most daily activities.  R knee AROM improving to 11-104 with supported R knee extension down to 2, demonstrating significant gains from ROM observed at eval. LEFS demonstrating 11 point improvement from eval but still demonstrating severe functional limitation at 13/80.  [name] will benefit from continued skilled PT to address ongoing ROM, flexibility, abnormal muscle tension, strength and balance deficits to improve mobility and activity tolerance with decreased pain interference and decreased risk for falls.   How did symptoms start: Sudden onset of pain and swelling in her R knee the day after she had been out with family  Average pain intensity:  Last 24 hours: 7/10  Past week: 7-9/10  How often does the pt experience symptoms?  Frequently  How much have the symptoms interfered with usual daily activities? Quite a bit  How has condition changed since care began at this facility? Better  In general, how is the patients overall health? Fair  Onset date: early October   BACK PAIN (STarT Back Screening Tool) - (When applicable): N/A  Has your back pain spread down your leg(s) at sometime in the last 2 weeks? []  Yes   []  No Have you had pain in the shoulder or neck at sometime in the past 2 weeks? []  Yes   []  No Have you only walked short distances because of your back pain? []  Yes   []  No In the past 2 weeks, have you dressed more slowly than usual because of your back pain? []  Yes   []  No Do you think it is not really safe for person with a condition like yours to be physically active? []  Yes   []  No Have worrying thoughts been going through your mind a lot of the time? []  Yes   []  No Do you feel that your back pain is terrible and it is never going to get any better? []  Yes   []  No In general, have you stopped enjoying all the things you usually enjoy? []  Yes   []  No Overall, how bothersome has your back pain been in the last 2 weeks? []  Not at all   []  Slightly     []  Moderate   []  Very much     []  Extremely   "

## 2024-02-14 ENCOUNTER — Ambulatory Visit

## 2024-02-14 ENCOUNTER — Other Ambulatory Visit: Payer: Self-pay

## 2024-02-14 DIAGNOSIS — R2689 Other abnormalities of gait and mobility: Secondary | ICD-10-CM

## 2024-02-14 DIAGNOSIS — M25661 Stiffness of right knee, not elsewhere classified: Secondary | ICD-10-CM

## 2024-02-14 DIAGNOSIS — R2681 Unsteadiness on feet: Secondary | ICD-10-CM

## 2024-02-14 DIAGNOSIS — M6281 Muscle weakness (generalized): Secondary | ICD-10-CM | POA: Diagnosis not present

## 2024-02-14 DIAGNOSIS — M25561 Pain in right knee: Secondary | ICD-10-CM

## 2024-02-14 NOTE — Therapy (Signed)
 " OUTPATIENT PHYSICAL THERAPY TREATMENT   Patient Name: RENESHIA ZUCCARO MRN: [id number] DOB:1940/06/26, 84 y.o., female Today's Date: 02/14/2024   END OF SESSION:  PT End of Session - 02/14/24 1420     Visit Number 9    Date for Recertification  04/02/24    Authorization Type [employer] Medicare    Authorization Time Period 01/09/24 - 04/02/24    PT Start Time 1406    PT Stop Time 1445    PT Time Calculation (min) 39 min    Activity Tolerance Patient tolerated treatment well    Behavior During Therapy [facility] for tasks assessed/performed           Past Medical History:  Diagnosis Date   Abdominal pain in female 03/18/2010   Qualifier: Diagnosis of  By: Avram MD, NOLIA Pitts E    Anemia 06/08/2014   Anxiety    Arthritis    Spinal Osteoarthritis   Breast cancer (HCC)    Carcinoid tumor of stomach (HCC)    Cataract    Chest pain    Myoview  12/15 no ischemia.   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 09/05/2006   Qualifier: Diagnosis of  By: Wilhemina RMA, Lucy     Diabetic peripheral neuropathy (HCC) 10/29/2013   Encounter for preventative adult health care exam with abnormal findings 09/14/2013   Esophageal reflux    Gastric polyp    Fundic Gland   Gastroparesis    Headache(784.0)    Heart murmur    Echocardiogram 2/11: EF 60-65%, mild LAE, grade 1 diastolic dysfunction, aortic valve sclerosis, mean gradient 9 mm of mercury, PASP 34   Hematuria 03/16/2016   Iron  deficiency anemia, unspecified    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   Leg swelling    bilateral   Neck pain 04/22/2015   PONV (postoperative nausea and vomiting)    pt states body temperature drops every time she has anesthesia; pt states only needs small amount of anesthesia   PSVT (paroxysmal supraventricular tachycardia)    Pure hypercholesterolemia    Recurrent UTI 01/11/2016   Renal insufficiency 06/18/2019   pt states  L  kidney function very low-functions at about 20%   Stroke [facility])    tia, 2014   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   Type II or unspecified type diabetes mellitus without mention of complication, not stated as uncontrolled    Unspecified essential hypertension    Unspecified hereditary and idiopathic peripheral neuropathy 10/29/2013   Vitamin D  deficiency    Past Surgical History:  Procedure Laterality Date   BREAST BIOPSY Right 06/13/2022   US  RT BREAST BX W LOC DEV 1ST LESION IMG BX SPEC US  GUIDE 06/13/2022 [facility] MAMMOGRAPHY   BREAST LUMPECTOMY Right 07/31/2022   Procedure: RIGHT BREAST LUMPECTOMY;  Surgeon: Vernetta Berg, MD;  Location: [facility];  Service: General;  Laterality: Right;   CHOLECYSTECTOMY  1993   COLONOSCOPY  11/11/2010   diverticulosis   DILATATION & CURRETTAGE/HYSTEROSCOPY WITH RESECTOCOPE N/A 02/25/2013   Procedure: Attempted hysteroscopy with uterine perforation;  Surgeon: Bobie FORBES Crown de Charlynn FORBES Cary, MD;  Location: [facility];  Service: Gynecology;  Laterality: N/A;   ESOPHAGOGASTRODUODENOSCOPY  08/29/2010; 09/15/2010   Carcinoid tumor less than 1 cm in July 2012 not seen in August 2012 , gastritis, fundic gland polyps   ESOPHAGOGASTRODUODENOSCOPY  05/16/2011   ESOPHAGOGASTRODUODENOSCOPY  06/14/2012   EUS  12/15/2010  Procedure: UPPER ENDOSCOPIC ULTRASOUND (EUS) LINEAR;  Surgeon: Toribio Cedar, MD;  Location: [facility];  Service: Endoscopy;  Laterality: N/A;   EYE SURGERY Bilateral    Bi lateral cateracts and bi lateral laser   LAPAROSCOPY N/A 02/25/2013   Procedure: Cystoscopy and laparoscopy with fulguration of uterine serosa;  Surgeon: Bobie FORBES Crown de Charlynn FORBES Cary, MD;  Location: [facility];  Service: Gynecology;  Laterality: N/A;   TONSILLECTOMY     Patient Active Problem List   Diagnosis Date Noted   Acute pain of right knee 11/20/2023   Precordial chest pain 12/24/2022   Carcinoma of breast upper outer quadrant, right (HCC) 07/04/2022   SVT  (supraventricular tachycardia) 06/21/2022   Left-sided weakness 04/02/2022   Unsteady gait 04/02/2022   Oral lesion 03/08/2021   Sun-damaged skin 03/08/2021   Thiamine  deficiency 11/01/2020   Trigeminal neuralgia 08/21/2020   Pedal edema 08/21/2020   Chronic left-sided back pain 03/28/2020   Nocturia 03/28/2020   Left-sided headache 03/28/2020   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   Renal insufficiency 06/18/2019   Educated about COVID-19 virus infection 05/15/2019   Atrophic vaginitis 01/20/2019   Stenosis of carotid artery 11/12/2018   Anxiety 10/13/2018   Referred otalgia of left ear 04/23/2018   Chronic throat clearing 04/23/2018   Sinusitis 10/11/2017   Headache 07/12/2017   Dizzy spells 11/21/2016   Constipation 11/21/2016   Nonrheumatic aortic valve stenosis 05/23/2016   Aortic atherosclerosis 05/23/2016   Hematuria 03/16/2016   Recurrent UTI 01/11/2016   Pain of upper abdomen 06/06/2015   Neck pain 04/22/2015   Ear pain 02/28/2015   Abnormal urine 11/21/2014   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   Anemia 06/08/2014   RLS (restless legs syndrome) 11/23/2013   Diabetic peripheral neuropathy (HCC) 10/29/2013   Left-sided thoracic back pain 10/06/2013   Encounter for preventative adult health care exam with abnormal findings 09/14/2013   Iron  deficiency anemia    Status post laparoscopy 02/25/2013   Hyponatremia 01/09/2013   GERD (gastroesophageal reflux disease) 01/09/2013   Amaurosis fugax of left eye 10/16/2012   Low back pain 06/03/2012   Vitamin D  deficiency 03/11/2012   Bilateral hand pain 10/20/2011   Encounter for long-term (current) use of other medications 10/20/2011   IBS (irritable bowel syndrome) 08/14/2011   TIA (transient ischemic attack) 02/10/2011   Abnormal brain CT 01/19/2011   Allergic rhinitis 10/01/2010   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   Preventative health care 07/15/2010   FUNDIC GLAND POLYPS OF  STOMACH 03/18/2010   Abdominal pain in female 03/18/2010   Left hip pain 03/17/2009   SYSTOLIC MURMUR 03/02/2009   Paroxysmal supraventricular tachycardia 01/12/2009   PLANTAR FASCIITIS 06/08/2008   CHEST PAIN 05/18/2008   Gastroparesis 12/18/2007   HYPERCHOLESTEROLEMIA 06/11/2007   Type 2 diabetes mellitus with obesity 09/05/2006   Essential hypertension 09/05/2006    PCP: Domenica Harlene LABOR, MD   REFERRING PROVIDER: Charles Redell LABOR, DO  REFERRING DIAG: M17.11 (ICD-10-CM) - Primary osteoarthritis of right knee   THERAPY DIAG:  Muscle weakness (generalized)  Acute pain of right knee  RATIONALE FOR EVALUATION AND TREATMENT: Rehabilitation  ONSET DATE: early October  NEXT MD VISIT: TBD   SUBJECTIVE:  SUBJECTIVE STATEMENT: Patient reports pain in the R knee today is 5/10.   Reports much improved pain and swelling R knee / leg     EVAL: Pt reports on Sunday in early October she was out for dinner with her family and the next day her knee was swollen and she could barely put weight on it.  She waited about 2 weeks before going to see the doctor.  MD drew some fluid off and gave her a cortisone shot which helped for ~1 week but now the pain is back and she feels crippled. Pain prevents her from walking much or going out.  Difficulty putting on her clothes and shoes.  She also notes a burning feeling in her knee and anterior leg.  She has variable swelling in her knee - better today after taking lasix  yesterday.  She states she has to use a cane at home but did not bring it with her to PT today.  She states now her R hip and back are also hurting due to limping for her knee.  Pain prevents her from sleeping, esp on her sides, which is her preferred sleeping position.  PAIN: Are you having  pain? Yes: NPRS scale: 7-8/10  Pain location: R knee, lower thigh and upper lower leg  Pain description: sharp  Aggravating factors: walking, prolonged standing, lower body dressing  Relieving factors: Voltaren  gel, Tylenol , ice - nothing give much relief  PERTINENT HISTORY:  Extensive PMH including chronic back and neck pain, OA, osteoporosis, DM-II, HTN, h/o TIA, diabetic peripheral neuropathy, PVD, headaches, sleep dysfunction, anxiety, R breast cancer s/p lumpectomy 07/31/22, SVT (refer to above problem list and past medical/surgical history for full PMH)   PRECAUTIONS: Fall  RED FLAGS: None  WEIGHT BEARING RESTRICTIONS: No  FALLS:  Has patient fallen in last 6 months? No  LIVING ENVIRONMENT: Lives with: lives alone Lives in: House Stairs: No Has following equipment at home: Single point cane, shower chair, and Grab bars  OCCUPATION: Retired  PLOF: Independent, Needs assistance with homemaking, and Leisure: read and play card games on her phone, traveling, and housekeeping/cleaning service ~1x/month   PATIENT GOALS: Just to walk freely with no pain. Be able to climb stairs.   OBJECTIVE: (objective measures completed at initial evaluation unless otherwise dated)  DIAGNOSTIC FINDINGS:  11/27/23 - DG RIGHT KNEE - COMPLETE 4+ VIEW CLINICAL DATA:  Right knee pain. Primary osteoarthritis of right knee. Acute knee pain.   FINDINGS: The alignment and joint spaces are preserved. Mild tricompartmental peripheral spurring and spurring of the tibial spines. Small knee joint effusion. No fracture, erosion, or focal bone abnormality. Frontal view of the left knee included for comparison demonstrates tibiofemoral osteoarthritis.   IMPRESSION: Mild tricompartmental osteoarthritis of the right knee. Small knee joint effusion.  PATIENT SURVEYS:  LEFS  Extreme difficulty/unable (0), Quite a bit of difficulty (1), Moderate difficulty (2), Little difficulty (3), No difficulty  (4) Survey date:  01/09/24  02/04/24  Any of your usual work, housework or school activities 0 1  2. Usual hobbies, recreational or sporting activities 0 0  3. Getting into/out of the bath 1 1  4. Walking between rooms 1 3  5. Putting on socks/shoes 0 0  6. Squatting  0 0  7. Lifting an object, like a bag of groceries from the floor 0 1  8. Performing light activities around your home 0 1  9. Performing heavy activities around your home 0 0  10. Getting into/out of a  car 0 0  11. Walking 2 blocks 0 0  12. Walking 1 mile 0 0  13. Going up/down 10 stairs (1 flight) 0 0  14. Standing for 1 hour 0 1  15.  sitting for 1 hour 0 4  16. Running on even ground 0 0  17. Running on uneven ground 0 0  18. Making sharp turns while running fast 0 0  19. Hopping  0 0  20. Rolling over in bed 0 1  Score total:  2/80 13/80  Functional limitation: Severe Severe     COGNITION: Overall cognitive status: Within functional limits for tasks assessed    SENSATION: WFL Pt reports burning and itching in her knee at times  EDEMA:  Circumferential:   Mid patella: R = 46.0 cm L = 43.5 cm  6 cm above: R = 49.0 cm L = 45.0 cm  6 cm below: R = 43.0 cm L = 40.0 cm  POSTURE:  rounded shoulders, forward head, and weight shift left  PALPATION: Very TTP over patella and anterior knee - increased edema apparent with decreased patellar mobility and poor quad activation (especially VMO) with quad set  MUSCLE LENGTH: TBA next visit Hamstrings:  ITB:  Piriformis:  Hip flexors:  Quads:  Heelcord:   LOWER EXTREMITY ROM:  Active ROM Right eval Left eval R 02/04/24 RLE  Knee flexion 82 120 104 105  Knee extension 18 LAQ /  8 supported 0 11 LAQ /  2 supported -2 to 3 degrees in supine  (Blank rows = not tested)  LOWER EXTREMITY MMT: (Tested in sitting on eval)  MMT Right eval Left eval  Hip flexion 3 3+  Hip extension 2 3-  Hip abduction 3+ 4-  Hip adduction 3+ 4-  Hip internal rotation     Hip external rotation    Knee flexion 3+ 4-  Knee extension 3- 4  Ankle dorsiflexion 3+ 4-  Ankle plantarflexion    Ankle inversion    Ankle eversion     (Blank rows = not tested)  FUNCTIONAL TESTS: 01/16/24 5 times sit to stand: 47.84 sec  Timed up and go (TUG): 44.75 sec with SPC  10 meter walk test: 54.47 sec with SPC  Gait speed = 0.60 ft/sec with SPC  GAIT: Distance walked: 50' Assistive device utilized: Single point cane and None Level of assistance: SBA Gait pattern: step to pattern, decreased step length- Left, decreased stride length, decreased hip/knee flexion- Right, antalgic, and lateral lean- Left Comments: Increased instability evident with patient reporting she uses SPC at home but did not bring it with her to PT   TODAY'S TREATMENT:  02/12/24: somewhat abbreviated session as pt  7 min late: Supine , Knees over bolster, deep tissue massage R distal lateral quads , Itband Nustep L 4, x 5 min  Standing with 2 # cuff wts ankles for alt hip abd, alt hip ext ,alt hamstring curls, 2 x 10 each Seated marches alt with red t band 2 x 10 Seated SLR 2 x 10 each leg   02/06/24 THERAPEUTIC EXERCISE: To improve strength, endurance, and ROM.  Demonstration, verbal and tactile cues throughout for technique.  NuStep - L3 x 6' (UE/LE)  Standing hip abd 2# x 15 RLE Standing hip ext x2# x 15 RLE Standing knee flexion 2# x 15 RLE Seated knee flexion GTB x 2/10 RLE Seated hip abd/ER GTB x 20 BLE Supine bent knee fallouts GTB x 20 BLE Supine SLR ham stretch  w/ PT assist x 1' x 3 RLE Supine cross over piriformis stretch x 1' x 3 RLE SL clam x 2/10 RLE Circumference measures at mid patella:  L knee = 42.5 cm;   R knee = 44 cm  NEUROMUSCULAR RE-EDUCATION: To improve strength and balance. Sidestepping GTB  to knees x 2 laps at counter  MANUAL THERAPY: To promote reduced pain utilizing myofascial release. MFR to R lateral hamstrings in SLR stretch position; MFR to R distal IT  in sidelying via knuckle massage  02/04/2024  THERAPEUTIC EXERCISE: To improve strength, endurance, and ROM.  Demonstration, verbal and tactile cues throughout for technique.  NuStep - L2 x 6' (UE/LE)  SELF CARE:   Provided instruction in application of Galvaran Knee Brace ([email or web address]://[email or web address]) which patient's sister recommended to her and she brought with to the therapy session.  Brace seemed a little snug and in the short trial of wearing it during therapy session seem to create some friction irritation along the edges of the brace and the seems with patient not really noting any difference in knee pain while wearing the brace, therefore recommended that she would probably be better off not using the brace.  MANUAL THERAPY: To promote normalized muscle tension, improved flexibility, improved joint mobility, increased ROM, and reduced pain utilizing connective tissue massage, therapeutic massage, manual TP therapy, and percussion massage with massage gun.  STM/DTM and percussion massage with massage gun to R glutes, piriformis, ITB, lateral quads and hamstrings  THERAPEUTIC ACTIVITIES: To improve functional performance.  Demonstration, verbal and tactile cues throughout for technique. LEFS: 13 / 80 = 16.3 % R knee ROM assessment - see ROM table above   01/29/24: Supine for AP jt mobs L prox tibia with thigh over bolster, 2 bouts 45 sec each, gentle, to improve R knee flexion motion  Mob with movement, lat glides R hip while assisting with R hip IR/ER and flex Side lying L with R thigh over 6 roll, for theragun massage R lateral hip musculature Supine with bolster under knees , manually resisted R hip IR, and then manually resisted R hip IR , 15 reps each  Ankle pumps with theraband for R ankle plantarflexion motion.   01/22/24 Nustep L1x42min UE/LE Seated R LE PF GTB X 10 Seated hip ABD X 10 B Seated ball squeeze x 10  Seated ball squeeze + LAQ x 10 Seated R hamstring stretch 2x30  sec Seated R adductor stretch 2x30 sec Seated R foot on 9' stool piriformis stretch 2x30 sec   01/18/24 THERAPEUTIC EXERCISE: To improve strength, endurance, ROM, and flexibility.  Demonstration, verbal and tactile cues throughout for technique.  NuStep - L3 x 6' (UE/LE) Seated hip ADD isometric ball squeeze 10 x 5 Seated R hip adductor stretch 2 x 30 Seated hip ADD ball squeeze + R LAQ x 10 Seated hip hinge R HS stretch 2 x 30 Seated R knee flexion/extension heel slides on slider 2 x 10 Supine manual L hip adductor stretch 2 x 30 Supine manual L HS stretch 2 x 30 Supine manual L ITB stretch 2 x 30 Supine R quad set into towel roll 10 x 5  MANUAL THERAPY: To promote normalized muscle tension, improved flexibility, improved joint mobility, increased ROM, reduced pain, and reduced edema utilizing joint mobilization, connective tissue massage, therapeutic massage, manual TP therapy, and kinesiotaping. Kinesiotaping to R knee (blue Kinesiotex Classic) - chondromalacia pattern with slight increased stretch on lateral strip to promote medial patellar glide R patellar mobilization all  directions - very limited mobility with some increased pain reported at points of PT pressure STM/DTM and manual TPR to R hip adductors, quads and ITB   01/16/24  THERAPEUTIC ACTIVITIES: To improve functional performance.  Demonstration, verbal and tactile cues throughout for technique. 5xSTS = 47.84 sec TUG = 44.75 sec with SPC = 54.47 sec with SPC Gait speed = 0.60 ft/sec with SPC  GAIT TRAINING: To normalize gait pattern and improve safety with SPC. 5' with SPC and SBA of PT - repeated verbal and visual cues/demonstration for step through pattern and coordination of SPC with R LE, however patient continues to demonstrate step to pattern  THERAPEUTIC EXERCISE: To improve strength, ROM, and flexibility.  Demonstration, verbal and tactile cues throughout for technique. Supine quad set on towel  roll 10 x 5 Supine hip ABD/ADD x 10 Manual HS stretch by PT 2 x 30 Mod thomas quad/hip flexor stretch 2 x 30-60 Attempted R hip ADD butterfly stretch but deferred d/t increased pain  B HS curl/AAROM R knee flexion with heels on peanut ball and strap assist for R LE 2 x 10  MANUAL THERAPY: To promote normalized muscle tension, improved flexibility, improved joint mobility, increased ROM, and reduced pain utilizing joint mobilization, connective tissue massage, therapeutic massage, and kinesiotaping.  STM and gentle IASTM with foam roller to R quads, hip adductors and ITB R patellar mobilization all directions - very limited mobility however able to reduce pain with heel slides when PT providing medial patellar glide Kinesiotaping to R knee (sensitive skin tape - light blue Kinesiotex Gold) - chondromalacia pattern with slight increased stretch on lateral strip to promote medial patellar glide  MODALITIES:  Ice pack to R knee x 10' in sitting with LE elevated on 9 stool to reduce post-exercise pain and edema   01/09/24 - Eval SELF CARE:   Reviewed eval findings and role of PT in addressing identified deficits as well as instruction in initial HEP (see below).  Provided instruction in gait training with SPC in L hand to offset R knee.  Patient only able to demonstrate step to pattern despite repeated verbal cues and demonstration of step through pattern.  Further training indicated next visit. Discussed taking her lasix  more frequently (prescribed for daily PRN) to help with LE edema as she notes slight improvement in LE edema and knee pain after she takes the lasix .  Also explained that her potassium supplement should be taken with the lasix  to offset the loss of potassium as a result of the lasix .    PATIENT EDUCATION:  Education details: updated HEP and provided handouts    Person educated: Patient Education method: Explanation, Demonstration, and Verbal cues Education comprehension:  verbalized understanding and needs further education  HOME EXERCISE PROGRAM: Access Code: [email or web address] URL: [email or web address] Date: 02/06/2024 Prepared by: Garnette Montclair  Exercises - Supine Quad Set on Towel Roll  - 2-3 x daily - 7 x weekly - 2 sets - 10 reps - 5 sec hold - Seated Heel Slide  - 2-3 x daily - 7 x weekly - 2 sets - 10 reps - 3 sec hold - Seated Hip Adduction Isometrics with Ball  - 2 x daily - 7 x weekly - 2 sets - 10 reps - 3-5 sec hold - Seated Long Arc Quad with Hip Adduction  - 2 x daily - 7 x weekly - 2 sets - 10 reps - 3 sec hold - Seated Hip Adductor Stretch  - 2 x daily -  7 x weekly - 3 reps - 30 sec hold - Seated Hamstring Stretch  - 2 x daily - 7 x weekly - 3 reps - 30 sec hold - Clamshell  - 1 x daily - 7 x weekly - 1 sets - 10 reps - Seated Hip Abduction with Resistance  - 1 x daily - 7 x weekly - 2 sets - 10 reps - Supine Piriformis Stretch  - 1 x daily - 7 x weekly - 1 sets - 2 reps - 1 min hold - Seated Hamstring Curl with Anchored Resistance  - 1 x daily - 7 x weekly - 2 sets - 10 reps  ASSESSMENT:  CLINICAL IMPRESSION: Patient is having less pain in the R knee.  She was able to tolerate more intensive resistance training LE's with emphasis on B hip strength.  She is using her cane as we have recommended and comes to clinic with it.   She is advised to continue using cane to reduce fall risk.    She is progressing to goals.    Continue per POC  EVAL: Resha N Lipschutz is a 84 y.o. female who was referred to physical therapy for evaluation and treatment for acute R knee pain secondary to osteoarthritis.  Patient reports onset of R knee pain beginning in early October without known injury or precipitating event, other than having gone out to dinner with her family the night before.  Pain is worse with any standing or walking, as well as lower body dressing.  Patient has deficits in R knee ROM, B LE flexibility, B LE strength, abnormal posture,  antalgic gait pattern with evidence of gait instability, and TTP with abnormal muscle tension which are interfering with ADLs and are impacting quality of life.  On LEFS patient scored 2/80 demonstrating severe functional limitation.  Further standardized balance testing to be completed next visit.  Isebella will benefit from skilled PT to address above deficits to improve mobility and activity tolerance with decreased pain interference.  OBJECTIVE IMPAIRMENTS: Abnormal gait, decreased activity tolerance, decreased balance, decreased endurance, decreased knowledge of condition, decreased knowledge of use of DME, decreased mobility, difficulty walking, decreased ROM, decreased strength, decreased safety awareness, increased edema, increased fascial restrictions, impaired perceived functional ability, increased muscle spasms, impaired flexibility, improper body mechanics, postural dysfunction, and pain.   ACTIVITY LIMITATIONS: carrying, lifting, bending, sitting, standing, squatting, sleeping, stairs, transfers, bed mobility, bathing, dressing, and locomotion level  PARTICIPATION LIMITATIONS: meal prep, cleaning, laundry, driving, shopping, community activity, and church  PERSONAL FACTORS: Age, Behavior pattern, Fitness, Past/current experiences, Time since onset of injury/illness/exacerbation, and 3+ comorbidities: Extensive PMH including chronic back and neck pain, OA, osteoporosis, DM-II, HTN, h/o TIA, diabetic peripheral neuropathy, PVD, headaches, sleep dysfunction, anxiety, R breast cancer s/p lumpectomy 07/31/22, SVT  are also affecting patient's functional outcome.   REHAB POTENTIAL: Good  CLINICAL DECISION MAKING: Evolving/moderate complexity  EVALUATION COMPLEXITY: Moderate   GOALS: Goals reviewed with patient? Yes  SHORT TERM GOALS: Target date: 02/20/2024  Patient will be independent with initial HEP. Baseline: HEP initiated on eval 01/16/24 - Limited review today 01/18/24 - full HEP  review with slight modification/update Goal status: IN PROGRESS - 02/04/24 - Pt denies concerns, but not formally reviewed  2.  Patient will report at least 25% improvement in R knee pain to improve QOL. Baseline: 10/10 Goal status: MET - 02/04/24 - 30-40% improvement in R knee pain  3.  Patient will demonstrate improved R knee AROM to >/= 10-95 to  allow for normal gait and stair mechanics. Baseline: Refer to above LE ROM table - R knee AROM 18-82, with 8 extension lacking when knee supported  Goal status: PARTIALLY MET - 02/04/24 - R knee AROM 11-104, with 2 lacking in supported extension  4.  Complete standardized balance testing and update POC/goals as indicated. Baseline:  Goal status: IN PROGRESS - 01/16/24 - 5xSTS, TUG and completed   LONG TERM GOALS: Target date: 04/02/2024  Patient will be independent with advanced/ongoing HEP to improve outcomes and carryover.  Baseline: 02/06/24 advanced and provided written handouts on new exercises Goal status: IN PROGRESS  2.  Patient will report at least 50% improvement in R knee pain to improve QOL. Baseline: 10/10 02/06/24:  7/10 Goal status: IN PROGRESS  3.  Patient will demonstrate improved R knee AROM to >/= 5-110 to allow for normal gait and stair mechanics. Baseline: Refer to above LE ROM table - R knee AROM 18-82, with 8 Extension lacking when knee supported  02/06/24:  see updated tables Goal status: IN PROGRESS  4.  Patient will demonstrate improved B LE strength to >/= 4 to 4+/5 for improved stability and ease of mobility. Baseline: Refer to above LE MMT table Goal status: INITIAL  5.  Patient will be able to ambulate 600' with LRAD and normal gait pattern without increased pain to access community.  Baseline: Antalgic step to pattern with SPC on L, worsening antalgic gait pattern without AD Goal status: INITIAL  6. Patient will report >/= 30/80 on LEFS (MCID = 9 pts) to demonstrate improved functional  ability. Baseline: 2 / 80 = 2.5 % Goal status: IN PROGRESS - 02/04/24 - 13 / 80 = 16.3 %  7.  Patient will demonstrate at least 19/24 on DGI to decrease risk of falls. Baseline: NT on eval, 21/24 as of DC from PT on 07/09/23 Goal status: INITIAL   8.  Patient will improve 5x STS time to </= 25 seconds to demonstrate improved functional strength and transfer efficiency. Baseline: 47.84 sec - 01/16/24, 22.59 sec as of DC from PT on 07/09/23 Goal status: INITIAL   9.  Patient will demonstrate decreased TUG time to </= 18 sec to decrease risk for falls with transitional mobility. Baseline: 01/16/24 - 44.75 sec with SPC, 13.5 sec w/o AD as of 05/03/23 Goal status: INITIAL  10.  Patient will demonstrate gait speed of >/= 1.81 ft/sec to be a safe limited community ambulator with decreased risk for recurrent falls.  Baseline: 01/16/24 - 0.60 ft/sec with SPC, 2.78 ft/sec w/o AD as of 05/15/23 Goal status: INITIAL     PLAN:  PT FREQUENCY: 2x/week  PT DURATION: 12 weeks  PLANNED INTERVENTIONS: 97164- PT Re-evaluation, 97750- Physical Performance Testing, 97110-Therapeutic exercises, 97530- Therapeutic activity, 97112- Neuromuscular re-education, 97535- Self Care, 02859- Manual therapy, [phone]- Gait training, [phone]- Aquatic Therapy, [phone]- Electrical stimulation (unattended), 97016- Vasopneumatic device, [id number]- Ultrasound, [id number]- Ionotophoresis 4mg /ml Dexamethasone , 79439 (1-2 muscles), 20561 (3+ muscles)- Dry Needling, Patient/Family education, Balance training, Stair training, Taping, Joint mobilization, DME instructions, Cryotherapy, and Moist heat  PLAN FOR NEXT SESSION: complete standardized balance testing - DGI; gently progress R knee ROM; LE strengthening with emphasis on quads and hip stability; review and update HEP as indicated; manual therapy and/or modalities PRN for pain management;  Trig Mcbryar LITTIE Credit, PT 02/14/2024, 2:22 PM     Date of referral: 12/20/2023 Referring provider: Charles Redell LABOR, DO Referring diagnosis? M17.11 (ICD-10-CM) - Primary osteoarthritis of right knee Treatment diagnosis? (  if different than referring diagnosis)  Muscle weakness (generalized)  Acute pain of right knee  What was this (referring dx) caused by? Arthritis  Nature of Condition: Initial Onset (within last 3 months)   Laterality: Rt  Current Functional Measure Score: LEFS 13 / 80 = 16.3 %  Objective measurements identify impairments when they are compared to normal values, the uninvolved extremity, and prior level of function.  [x]  Yes  []  No  Objective assessment of functional ability: Moderate functional limitations   Briefly describe symptoms:   Nolia reports her R knee pain is much better today although pain rating only dropped from 9/10 last visit to 7-8/10 today.  She states her pain is no longer constant but still occurs on a frequent basis and still significantly impacts most daily activities.  R knee AROM improving to 11-104 with supported R knee extension down to 2, demonstrating significant gains from ROM observed at eval. LEFS demonstrating 11 point improvement from eval but still demonstrating severe functional limitation at 13/80.  Marypat will benefit from continued skilled PT to address ongoing ROM, flexibility, abnormal muscle tension, strength and balance deficits to improve mobility and activity tolerance with decreased pain interference and decreased risk for falls.   How did symptoms start: Sudden onset of pain and swelling in her R knee the day after she had been out with family  Average pain intensity:  Last 24 hours: 7/10  Past week: 7-9/10  How often does the pt experience symptoms? Frequently  How much have the symptoms interfered with usual daily activities? Quite a bit  How has condition changed since care began at this facility? Better  In general, how is the patients overall health? Fair  Onset date: early October   BACK PAIN (STarT Back  Screening Tool) - (When applicable): N/A  Has your back pain spread down your leg(s) at sometime in the last 2 weeks? []  Yes   []  No Have you had pain in the shoulder or neck at sometime in the past 2 weeks? []  Yes   []  No Have you only walked short distances because of your back pain? []  Yes   []  No In the past 2 weeks, have you dressed more slowly than usual because of your back pain? []  Yes   []  No Do you think it is not really safe for person with a condition like yours to be physically active? []  Yes   []  No Have worrying thoughts been going through your mind a lot of the time? []  Yes   []  No Do you feel that your back pain is terrible and it is never going to get any better? []  Yes   []  No In general, have you stopped enjoying all the things you usually enjoy? []  Yes   []  No Overall, how bothersome has your back pain been in the last 2 weeks? []  Not at all   []  Slightly     []  Moderate   []  Very much     []  Extremely   "

## 2024-02-14 NOTE — Therapy (Addendum)
 " OUTPATIENT PHYSICAL THERAPY TREATMENT   Patient Name: [name] MRN: [id number] DOB:February 10, 1940, 84 y.o., female Today's Date: 02/14/2024   END OF SESSION:  PT End of Session - 02/14/24 1616     Visit Number 9    Date for Recertification  04/02/24    Authorization Type [employer] Medicare    Authorization Time Period 01/09/24-04/02/24          Past Medical History:  Diagnosis Date   Abdominal pain in female 03/18/2010   Qualifier: Diagnosis of  By: Avram MD, NOLIA Pitts E    Anemia 06/08/2014   Anxiety    Arthritis    Spinal Osteoarthritis   Breast cancer (HCC)    Carcinoid tumor of stomach (HCC)    Cataract    Chest pain    Myoview  12/15 no ischemia.   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 09/05/2006   Qualifier: Diagnosis of  By: Wilhemina RMA, Lucy     Diabetic peripheral neuropathy (HCC) 10/29/2013   Encounter for preventative adult health care exam with abnormal findings 09/14/2013   Esophageal reflux    Gastric polyp    Fundic Gland   Gastroparesis    Headache(784.0)    Heart murmur    Echocardiogram 2/11: EF 60-65%, mild LAE, grade 1 diastolic dysfunction, aortic valve sclerosis, mean gradient 9 mm of mercury, PASP 34   Hematuria 03/16/2016   Iron  deficiency anemia, unspecified    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   Leg swelling    bilateral   Neck pain 04/22/2015   PONV (postoperative nausea and vomiting)    pt states body temperature drops every time she has anesthesia; pt states only needs small amount of anesthesia   PSVT (paroxysmal supraventricular tachycardia)    Pure hypercholesterolemia    Recurrent UTI 01/11/2016   Renal insufficiency 06/18/2019   pt states  L kidney function very low-functions at about 20%   Stroke [facility])    tia, 2014   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   Type II or unspecified type diabetes mellitus without mention of complication, not stated as  uncontrolled    Unspecified essential hypertension    Unspecified hereditary and idiopathic peripheral neuropathy 10/29/2013   Vitamin D  deficiency    Past Surgical History:  Procedure Laterality Date   BREAST BIOPSY Right 06/13/2022   US  RT BREAST BX W LOC DEV 1ST LESION IMG BX SPEC US  GUIDE 06/13/2022 [facility] MAMMOGRAP[facility]   BREAST LUMPECTOMY Right 07/31/2022   Procedure: RIGHT BREAST LUMPECTOMY;  Surgeon: Vernetta Berg, MD;  Location: [facility];  Service: General;  Laterality: Right;   CHOLECYSTECTOMY  1993   COLONOSCOPY  11/11/2010   diverticulosis   DILATATION & CURRETTAGE/HYSTEROSCOPY WITH RESECTOCOPE N/A 02/25/2013   Procedure: Attempted hysteroscopy with uterine perforation;  Surgeon: Bobie FORBES Crown de Charlynn FORBES Cary, MD;  Location: [facility];  Service: Gynecology;  Laterality: N/A;   ESOPHAGOGASTRODUODENOSCOPY  08/29/2010; 09/15/2010   Carcinoid tumor less than 1 cm in July 2012 not seen in August 2012 , gastritis, fundic gland polyps   ESOPHAGOGASTRODUODENOSCOPY  05/16/2011   ESOPHAGOGASTRODUODENOSCOPY  06/14/2012   EUS  12/15/2010   Procedure: UPPER ENDOSCOPIC ULTRASOUND (EUS) LINEAR;  Surgeon: Toribio Cedar, MD;  Location: [facility];  Service: Endoscopy;  Laterality: N/A;   EYE SURGERY Bilateral    Bi lateral cateracts and bi lateral laser   LAPAROSCOPY N/A 02/25/2013   Procedure: Cystoscopy  and laparoscopy with fulguration of uterine serosa;  Surgeon: Bobie FORBES Crown de Charlynn FORBES Cary, MD;  Location: [facility];  Service: Gynecology;  Laterality: N/A;   TONSILLECTOMY     Patient Active Problem List   Diagnosis Date Noted   Acute pain of right knee 11/20/2023   Precordial chest pain 12/24/2022   Carcinoma of breast upper outer quadrant, right (HCC) 07/04/2022   SVT (supraventricular tachycardia) 06/21/2022   Left-sided weakness 04/02/2022   Unsteady gait 04/02/2022   Oral lesion 03/08/2021   Sun-damaged skin 03/08/2021   Thiamine  deficiency 11/01/2020    Trigeminal neuralgia 08/21/2020   Pedal edema 08/21/2020   Chronic left-sided back pain 03/28/2020   Nocturia 03/28/2020   Left-sided headache 03/28/2020   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   Renal insufficiency 06/18/2019   Educated about COVID-19 virus infection 05/15/2019   Atrophic vaginitis 01/20/2019   Stenosis of carotid artery 11/12/2018   Anxiety 10/13/2018   Referred otalgia of left ear 04/23/2018   Chronic throat clearing 04/23/2018   Sinusitis 10/11/2017   Headache 07/12/2017   Dizzy spells 11/21/2016   Constipation 11/21/2016   Nonrheumatic aortic valve stenosis 05/23/2016   Aortic atherosclerosis 05/23/2016   Hematuria 03/16/2016   Recurrent UTI 01/11/2016   Pain of upper abdomen 06/06/2015   Neck pain 04/22/2015   Ear pain 02/28/2015   Abnormal urine 11/21/2014   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   Anemia 06/08/2014   RLS (restless legs syndrome) 11/23/2013   Diabetic peripheral neuropathy (HCC) 10/29/2013   Left-sided thoracic back pain 10/06/2013   Encounter for preventative adult health care exam with abnormal findings 09/14/2013   Iron  deficiency anemia    Status post laparoscopy 02/25/2013   Hyponatremia 01/09/2013   GERD (gastroesophageal reflux disease) 01/09/2013   Amaurosis fugax of left eye 10/16/2012   Low back pain 06/03/2012   Vitamin D  deficiency 03/11/2012   Bilateral hand pain 10/20/2011   Encounter for long-term (current) use of other medications 10/20/2011   IBS (irritable bowel syndrome) 08/14/2011   TIA (transient ischemic attack) 02/10/2011   Abnormal brain CT 01/19/2011   Allergic rhinitis 10/01/2010   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   Preventative health care 07/15/2010   FUNDIC GLAND POLYPS OF STOMACH 03/18/2010   Abdominal pain in female 03/18/2010   Left hip pain 03/17/2009   SYSTOLIC MURMUR 03/02/2009   Paroxysmal supraventricular tachycardia 01/12/2009   PLANTAR FASCIITIS 06/08/2008    CHEST PAIN 05/18/2008   Gastroparesis 12/18/2007   HYPERCHOLESTEROLEMIA 06/11/2007   Type 2 diabetes mellitus with obesity 09/05/2006   Essential hypertension 09/05/2006    PCP: Domenica Harlene LABOR, MD   REFERRING PROVIDER: Charles Redell LABOR, DO  REFERRING DIAG: M17.11 (ICD-10-CM) - Primary osteoarthritis of right knee   THERAPY DIAG:  Muscle weakness (generalized)  Acute pain of right knee  RATIONALE FOR EVALUATION AND TREATMENT: Rehabilitation  ONSET DATE: early October  NEXT MD VISIT: TBD   SUBJECTIVE:  SUBJECTIVE STATEMENT: Patient reports pain in R knee and shin today. Pt reports swelling with 5/10 pain. Pt brought in a brace from [facility] and wanted to try but was advised it was too small. Pt reports improved pain and swelling with continued care.   EVAL: Pt reports on Sunday in early October she was out for dinner with her family and the next day her knee was swollen and she could barely put weight on it.  She waited about 2 weeks before going to see the doctor.  MD drew some fluid off and gave her a cortisone shot which helped for ~1 week but now the pain is back and she feels crippled. Pain prevents her from walking much or going out.  Difficulty putting on her clothes and shoes.  She also notes a burning feeling in her knee and anterior leg.  She has variable swelling in her knee - better today after taking lasix  yesterday.  She states she has to use a cane at home but did not bring it with her to PT today.  She states now her R hip and back are also hurting due to limping for her knee.  Pain prevents her from sleeping, esp on her sides, which is her preferred sleeping position.  PAIN: Are you having pain? Yes: NPRS scale: 7-8/10  Pain location: R knee, lower thigh and upper lower  leg  Pain description: sharp  Aggravating factors: walking, prolonged standing, lower body dressing  Relieving factors: Voltaren  gel, Tylenol , ice - nothing give much relief  PERTINENT HISTORY:  Extensive PMH including chronic back and neck pain, OA, osteoporosis, DM-II, HTN, h/o TIA, diabetic peripheral neuropathy, PVD, headaches, sleep dysfunction, anxiety, R breast cancer s/p lumpectomy 07/31/22, SVT (refer to above problem list and past medical/surgical history for full PMH)   PRECAUTIONS: Fall  RED FLAGS: None  WEIGHT BEARING RESTRICTIONS: No  FALLS:  Has patient fallen in last 6 months? No  LIVING ENVIRONMENT: Lives with: lives alone Lives in: House Stairs: No Has following equipment at home: Single point cane, shower chair, and Grab bars  OCCUPATION: Retired  PLOF: Independent, Needs assistance with homemaking, and Leisure: read and play card games on her phone, traveling, and housekeeping/cleaning service ~1x/month   PATIENT GOALS: Just to walk freely with no pain. Be able to climb stairs.   OBJECTIVE: (objective measures completed at initial evaluation unless otherwise dated)  DIAGNOSTIC FINDINGS:  11/27/23 - DG RIGHT KNEE - COMPLETE 4+ VIEW CLINICAL DATA:  Right knee pain. Primary osteoarthritis of right knee. Acute knee pain.   FINDINGS: The alignment and joint spaces are preserved. Mild tricompartmental peripheral spurring and spurring of the tibial spines. Small knee joint effusion. No fracture, erosion, or focal bone abnormality. Frontal view of the left knee included for comparison demonstrates tibiofemoral osteoarthritis.   IMPRESSION: Mild tricompartmental osteoarthritis of the right knee. Small knee joint effusion.  PATIENT SURVEYS:  LEFS  Extreme difficulty/unable (0), Quite a bit of difficulty (1), Moderate difficulty (2), Little difficulty (3), No difficulty (4) Survey date:  01/09/24  02/04/24  Any of your usual work, housework or school  activities 0 1  2. Usual hobbies, recreational or sporting activities 0 0  3. Getting into/out of the bath 1 1  4. Walking between rooms 1 3  5. Putting on socks/shoes 0 0  6. Squatting  0 0  7. Lifting an object, like a bag of groceries from the floor 0 1  8. Performing light activities around your home  0 1  9. Performing heavy activities around your home 0 0  10. Getting into/out of a car 0 0  11. Walking 2 blocks 0 0  12. Walking 1 mile 0 0  13. Going up/down 10 stairs (1 flight) 0 0  14. Standing for 1 hour 0 1  15.  sitting for 1 hour 0 4  16. Running on even ground 0 0  17. Running on uneven ground 0 0  18. Making sharp turns while running fast 0 0  19. Hopping  0 0  20. Rolling over in bed 0 1  Score total:  2/80 13/80  Functional limitation: Severe Severe     COGNITION: Overall cognitive status: Within functional limits for tasks assessed    SENSATION: WFL Pt reports burning and itching in her knee at times  EDEMA:  Circumferential:   Mid patella: R = 46.0 cm L = 43.5 cm  6 cm above: R = 49.0 cm L = 45.0 cm  6 cm below: R = 43.0 cm L = 40.0 cm  POSTURE:  rounded shoulders, forward head, and weight shift left  PALPATION: Very TTP over patella and anterior knee - increased edema apparent with decreased patellar mobility and poor quad activation (especially VMO) with quad set  MUSCLE LENGTH: TBA next visit Hamstrings:  ITB:  Piriformis:  Hip flexors:  Quads:  Heelcord:   LOWER EXTREMITY ROM:  Active ROM Right eval Left eval R 02/04/24 RLE  Knee flexion 82 120 104 105  Knee extension 18 LAQ /  8 supported 0 11 LAQ /  2 supported -2 to 3 degrees in supine  (Blank rows = not tested)  LOWER EXTREMITY MMT: (Tested in sitting on eval)  MMT Right eval Left eval  Hip flexion 3 3+  Hip extension 2 3-  Hip abduction 3+ 4-  Hip adduction 3+ 4-  Hip internal rotation    Hip external rotation    Knee flexion 3+ 4-  Knee extension 3- 4  Ankle  dorsiflexion 3+ 4-  Ankle plantarflexion    Ankle inversion    Ankle eversion     (Blank rows = not tested)  FUNCTIONAL TESTS: 01/16/24 5 times sit to stand: 47.84 sec  Timed up and go (TUG): 44.75 sec with SPC  10 meter walk test: 54.47 sec with SPC  Gait speed = 0.60 ft/sec with SPC  GAIT: Distance walked: 50' Assistive device utilized: Single point cane and None Level of assistance: SBA Gait pattern: step to pattern, decreased step length- Left, decreased stride length, decreased hip/knee flexion- Right, antalgic, and lateral lean- Left Comments: Increased instability evident with patient reporting she uses SPC at home but did not bring it with her to PT   TODAY'S TREATMENT:  02/14/24 NuStep L4 6 minutes (UE/LE)  THERAPEUTIC EXERCISE  All the following exercises were performed to fatigue: Standing alt hip abduct, extension Seated resisted alt abduction red band  Seated Marches with red band  Seated alt resisted ankle pumps with blue band Seated alt hip flexion with knee extended Glute bridges with bolster under calf  Alt step ups on 6in block against the counter with light guarding hands free  THERAPEUTIC EXERCISE: To improve strength, and increased endurance to work towards pt goal of walking up 6 steps at family visits. Also ankle pumps with band resistance to increase blood flow to help reduce swelling lower legs  PT EDUCATION: Verbally and physically guided pt to step with L foot first when ascending  stairs when she goes to visit family. Advised pt to continue HEP to help with swelling and pain in knee.   02/12/24: somewhat abbreviated session as pt  7 min late: Supine , Knees over bolster, deep tissue massage R distal lateral quads , Itband Nustep L 4, x 5 min  Standing with 2 # cuff wts ankles for alt hip abd, alt hip ext ,alt hamstring curls, 2 x 10 each Seated marches alt with red t band 2 x 10 Seated SLR 2 x 10 each leg    02/06/24 THERAPEUTIC EXERCISE: To  improve strength, endurance, and ROM.  Demonstration, verbal and tactile cues throughout for technique.  NuStep - L3 x 6' (UE/LE)  Standing hip abd 2# x 15 RLE Standing hip ext x2# x 15 RLE Standing knee flexion 2# x 15 RLE Seated knee flexion GTB x 2/10 RLE Seated hip abd/ER GTB x 20 BLE Supine bent knee fallouts GTB x 20 BLE Supine SLR ham stretch w/ PT assist x 1' x 3 RLE Supine cross over piriformis stretch x 1' x 3 RLE SL clam x 2/10 RLE Circumference measures at mid patella:  L knee = 42.5 cm;   R knee = 44 cm  NEUROMUSCULAR RE-EDUCATION: To improve strength and balance. Sidestepping GTB  to knees x 2 laps at counter  MANUAL THERAPY: To promote reduced pain utilizing myofascial release. MFR to R lateral hamstrings in SLR stretch position; MFR to R distal IT in sidelying via knuckle massage  02/04/2024  THERAPEUTIC EXERCISE: To improve strength, endurance, and ROM.  Demonstration, verbal and tactile cues throughout for technique.  NuStep - L2 x 6' (UE/LE)  SELF CARE:   Provided instruction in application of Galvaran Knee Brace ([email or web address]://[email or web address]) which patient's sister recommended to her and she brought with to the therapy session.  Brace seemed a little snug and in the short trial of wearing it during therapy session seem to create some friction irritation along the edges of the brace and the seems with patient not really noting any difference in knee pain while wearing the brace, therefore recommended that she would probably be better off not using the brace.  MANUAL THERAPY: To promote normalized muscle tension, improved flexibility, improved joint mobility, increased ROM, and reduced pain utilizing connective tissue massage, therapeutic massage, manual TP therapy, and percussion massage with massage gun.  STM/DTM and percussion massage with massage gun to R glutes, piriformis, ITB, lateral quads and hamstrings  THERAPEUTIC ACTIVITIES: To improve functional performance.   Demonstration, verbal and tactile cues throughout for technique. LEFS: 13 / 80 = 16.3 % R knee ROM assessment - see ROM table above   01/29/24: Supine for AP jt mobs L prox tibia with thigh over bolster, 2 bouts 45 sec each, gentle, to improve R knee flexion motion  Mob with movement, lat glides R hip while assisting with R hip IR/ER and flex Side lying L with R thigh over 6 roll, for theragun massage R lateral hip musculature Supine with bolster under knees , manually resisted R hip IR, and then manually resisted R hip IR , 15 reps each  Ankle pumps with theraband for R ankle plantarflexion motion.   01/22/24 Nustep L1x57min UE/LE Seated R LE PF GTB X 10 Seated hip ABD X 10 B Seated ball squeeze x 10  Seated ball squeeze + LAQ x 10 Seated R hamstring stretch 2x30 sec Seated R adductor stretch 2x30 sec Seated R foot on 9' stool piriformis stretch 2x30 sec  01/18/24 THERAPEUTIC EXERCISE: To improve strength, endurance, ROM, and flexibility.  Demonstration, verbal and tactile cues throughout for technique.  NuStep - L3 x 6' (UE/LE) Seated hip ADD isometric ball squeeze 10 x 5 Seated R hip adductor stretch 2 x 30 Seated hip ADD ball squeeze + R LAQ x 10 Seated hip hinge R HS stretch 2 x 30 Seated R knee flexion/extension heel slides on slider 2 x 10 Supine manual L hip adductor stretch 2 x 30 Supine manual L HS stretch 2 x 30 Supine manual L ITB stretch 2 x 30 Supine R quad set into towel roll 10 x 5  MANUAL THERAPY: To promote normalized muscle tension, improved flexibility, improved joint mobility, increased ROM, reduced pain, and reduced edema utilizing joint mobilization, connective tissue massage, therapeutic massage, manual TP therapy, and kinesiotaping. Kinesiotaping to R knee (blue Kinesiotex Classic) - chondromalacia pattern with slight increased stretch on lateral strip to promote medial patellar glide R patellar mobilization all directions - very limited  mobility with some increased pain reported at points of PT pressure STM/DTM and manual TPR to R hip adductors, quads and ITB   01/16/24  THERAPEUTIC ACTIVITIES: To improve functional performance.  Demonstration, verbal and tactile cues throughout for technique. 5xSTS = 47.84 sec TUG = 44.75 sec with SPC = 54.47 sec with SPC Gait speed = 0.60 ft/sec with SPC  GAIT TRAINING: To normalize gait pattern and improve safety with SPC. 10' with SPC and SBA of PT - repeated verbal and visual cues/demonstration for step through pattern and coordination of SPC with R LE, however patient continues to demonstrate step to pattern  THERAPEUTIC EXERCISE: To improve strength, ROM, and flexibility.  Demonstration, verbal and tactile cues throughout for technique. Supine quad set on towel roll 10 x 5 Supine hip ABD/ADD x 10 Manual HS stretch by PT 2 x 30 Mod thomas quad/hip flexor stretch 2 x 30-60 Attempted R hip ADD butterfly stretch but deferred d/t increased pain  B HS curl/AAROM R knee flexion with heels on peanut ball and strap assist for R LE 2 x 10  MANUAL THERAPY: To promote normalized muscle tension, improved flexibility, improved joint mobility, increased ROM, and reduced pain utilizing joint mobilization, connective tissue massage, therapeutic massage, and kinesiotaping.  STM and gentle IASTM with foam roller to R quads, hip adductors and ITB R patellar mobilization all directions - very limited mobility however able to reduce pain with heel slides when PT providing medial patellar glide Kinesiotaping to R knee (sensitive skin tape - light blue Kinesiotex Gold) - chondromalacia pattern with slight increased stretch on lateral strip to promote medial patellar glide  MODALITIES:  Ice pack to R knee x 10' in sitting with LE elevated on 9 stool to reduce post-exercise pain and edema   01/09/24 - Eval SELF CARE:   Reviewed eval findings and role of PT in addressing identified deficits  as well as instruction in initial HEP (see below).  Provided instruction in gait training with SPC in L hand to offset R knee.  Patient only able to demonstrate step to pattern despite repeated verbal cues and demonstration of step through pattern.  Further training indicated next visit. Discussed taking her lasix  more frequently (prescribed for daily PRN) to help with LE edema as she notes slight improvement in LE edema and knee pain after she takes the lasix .  Also explained that her potassium supplement should be taken with the lasix  to offset the loss of potassium as a result  of the lasix .    PATIENT EDUCATION:  Education details: updated HEP and provided handouts    Person educated: Patient Education method: Explanation, Demonstration, and Verbal cues Education comprehension: verbalized understanding and needs further education  HOME EXERCISE PROGRAM: Access Code: [email or web address] URL: [email or web address] Date: 02/06/2024 Prepared by: Garnette Montclair  Exercises - Supine Quad Set on Towel Roll  - 2-3 x daily - 7 x weekly - 2 sets - 10 reps - 5 sec hold - Seated Heel Slide  - 2-3 x daily - 7 x weekly - 2 sets - 10 reps - 3 sec hold - Seated Hip Adduction Isometrics with Ball  - 2 x daily - 7 x weekly - 2 sets - 10 reps - 3-5 sec hold - Seated Long Arc Quad with Hip Adduction  - 2 x daily - 7 x weekly - 2 sets - 10 reps - 3 sec hold - Seated Hip Adductor Stretch  - 2 x daily - 7 x weekly - 3 reps - 30 sec hold - Seated Hamstring Stretch  - 2 x daily - 7 x weekly - 3 reps - 30 sec hold - Clamshell  - 1 x daily - 7 x weekly - 1 sets - 10 reps - Seated Hip Abduction with Resistance  - 1 x daily - 7 x weekly - 2 sets - 10 reps - Supine Piriformis Stretch  - 1 x daily - 7 x weekly - 1 sets - 2 reps - 1 min hold - Seated Hamstring Curl with Anchored Resistance  - 1 x daily - 7 x weekly - 2 sets - 10 reps  ASSESSMENT:  CLINICAL IMPRESSION: Patient is having less pain in the R  knee.  She was able to tolerate more intensive resistance training LE's with emphasis on glute, hip, and thigh strength.  She is using her cane as we have recommended and comes to clinic with it. She is advised to continue using cane to reduce fall risk. She is progressing to goals and would like to start with stair training for personal and social activities.    Continue per POC  EVAL: Rian N Verga is a 84 y.o. female who was referred to physical therapy for evaluation and treatment for acute R knee pain secondary to osteoarthritis.  Patient reports onset of R knee pain beginning in early October without known injury or precipitating event, other than having gone out to dinner with her family the night before.  Pain is worse with any standing or walking, as well as lower body dressing.  Patient has deficits in R knee ROM, B LE flexibility, B LE strength, abnormal posture, antalgic gait pattern with evidence of gait instability, and TTP with abnormal muscle tension which are interfering with ADLs and are impacting quality of life.  On LEFS patient scored 2/80 demonstrating severe functional limitation.  Further standardized balance testing to be completed next visit.  Deneene will benefit from skilled PT to address above deficits to improve mobility and activity tolerance with decreased pain interference.  OBJECTIVE IMPAIRMENTS: Abnormal gait, decreased activity tolerance, decreased balance, decreased endurance, decreased knowledge of condition, decreased knowledge of use of DME, decreased mobility, difficulty walking, decreased ROM, decreased strength, decreased safety awareness, increased edema, increased fascial restrictions, impaired perceived functional ability, increased muscle spasms, impaired flexibility, improper body mechanics, postural dysfunction, and pain.   ACTIVITY LIMITATIONS: carrying, lifting, bending, sitting, standing, squatting, sleeping, stairs, transfers, bed mobility, bathing,  dressing, and locomotion level  PARTICIPATION LIMITATIONS: meal  prep, cleaning, laundry, driving, shopping, community activity, and church  PERSONAL FACTORS: Age, Behavior pattern, Fitness, Past/current experiences, Time since onset of injury/illness/exacerbation, and 3+ comorbidities: Extensive PMH including chronic back and neck pain, OA, osteoporosis, DM-II, HTN, h/o TIA, diabetic peripheral neuropathy, PVD, headaches, sleep dysfunction, anxiety, R breast cancer s/p lumpectomy 07/31/22, SVT  are also affecting patient's functional outcome.   REHAB POTENTIAL: Good  CLINICAL DECISION MAKING: Evolving/moderate complexity  EVALUATION COMPLEXITY: Moderate   GOALS: Goals reviewed with patient? Yes  SHORT TERM GOALS: Target date: 02/20/2024  Patient will be independent with initial HEP. Baseline: HEP initiated on eval 01/16/24 - Limited review today 01/18/24 - full HEP review with slight modification/update Goal status: IN PROGRESS - 02/04/24 - Pt denies concerns, but not formally reviewed  2.  Patient will report at least 25% improvement in R knee pain to improve QOL. Baseline: 10/10 Goal status: MET - 02/04/24 - 30-40% improvement in R knee pain  3.  Patient will demonstrate improved R knee AROM to >/= 10-95 to allow for normal gait and stair mechanics. Baseline: Refer to above LE ROM table - R knee AROM 18-82, with 8 extension lacking when knee supported  Goal status: PARTIALLY MET - 02/04/24 - R knee AROM 11-104, with 2 lacking in supported extension  4.  Complete standardized balance testing and update POC/goals as indicated. Baseline:  Goal status: IN PROGRESS - 01/16/24 - 5xSTS, TUG and completed   LONG TERM GOALS: Target date: 04/02/2024  Patient will be independent with advanced/ongoing HEP to improve outcomes and carryover.  Baseline: 02/06/24 advanced and provided written handouts on new exercises Goal status: IN PROGRESS  2.  Patient will report at least 50%  improvement in R knee pain to improve QOL. Baseline: 10/10 02/06/24:  7/10 Goal status: IN PROGRESS  3.  Patient will demonstrate improved R knee AROM to >/= 5-110 to allow for normal gait and stair mechanics. Baseline: Refer to above LE ROM table - R knee AROM 18-82, with 8 Extension lacking when knee supported  02/06/24:  see updated tables Goal status: IN PROGRESS  4.  Patient will demonstrate improved B LE strength to >/= 4 to 4+/5 for improved stability and ease of mobility. Baseline: Refer to above LE MMT table Goal status: INITIAL  5.  Patient will be able to ambulate 600' with LRAD and normal gait pattern without increased pain to access community.  Baseline: Antalgic step to pattern with SPC on L, worsening antalgic gait pattern without AD Goal status: INITIAL  6. Patient will report >/= 30/80 on LEFS (MCID = 9 pts) to demonstrate improved functional ability. Baseline: 2 / 80 = 2.5 % Goal status: IN PROGRESS - 02/04/24 - 13 / 80 = 16.3 %  7.  Patient will demonstrate at least 19/24 on DGI to decrease risk of falls. Baseline: NT on eval, 21/24 as of DC from PT on 07/09/23 Goal status: INITIAL   8.  Patient will improve 5x STS time to </= 25 seconds to demonstrate improved functional strength and transfer efficiency. Baseline: 47.84 sec - 01/16/24, 22.59 sec as of DC from PT on 07/09/23 Goal status: INITIAL   9.  Patient will demonstrate decreased TUG time to </= 18 sec to decrease risk for falls with transitional mobility. Baseline: 01/16/24 - 44.75 sec with SPC, 13.5 sec w/o AD as of 05/03/23 Goal status: INITIAL  10.  Patient will demonstrate gait speed of >/= 1.81 ft/sec to be a safe limited community ambulator with  decreased risk for recurrent falls.  Baseline: 01/16/24 - 0.60 ft/sec with SPC, 2.78 ft/sec w/o AD as of 05/15/23 Goal status: INITIAL     PLAN:  PT FREQUENCY: 2x/week  PT DURATION: 12 weeks  PLANNED INTERVENTIONS: 97164- PT Re-evaluation, 97750-  Physical Performance Testing, 97110-Therapeutic exercises, 97530- Therapeutic activity, 97112- Neuromuscular re-education, 97535- Self Care, [id number]- Manual therapy, [phone]- Gait training, [phone]- Aquatic Therapy, [phone]- Electrical stimulation (unattended), 97016- Vasopneumatic device, [id number]- Ultrasound, [id number]- Ionotophoresis 4mg /ml Dexamethasone , 79439 (1-2 muscles), [id number] (3+ muscles)- Dry Needling, Patient/Family education, Balance training, Stair training, Taping, Joint mobilization, DME instructions, Cryotherapy, and Moist heat  PLAN FOR NEXT SESSION: complete standardized balance testing - DGI; gently progress R knee ROM; LE strengthening with emphasis on quads and hip stability; review and update HEP as indicated; manual therapy and/or modalities PRN for pain management; pt mentioned that the co payments are becoming cost prohibitive so she is due to 10 th visit progress note next visit and we will consolidate her HEP and discuss DC plans  Amy L Speaks, PT, DPT, OCS 02/14/2024, 5:02 PM     Date of referral: 12/20/2023 Referring provider: Charles Redell LABOR, DO Referring diagnosis? M17.11 (ICD-10-CM) - Primary osteoarthritis of right knee Treatment diagnosis? (if different than referring diagnosis)  Muscle weakness (generalized)  Acute pain of right knee  What was this (referring dx) caused by? Arthritis  Nature of Condition: Initial Onset (within last 3 months)   Laterality: Rt  Current Functional Measure Score: LEFS 13 / 80 = 16.3 %  Objective measurements identify impairments when they are compared to normal values, the uninvolved extremity, and prior level of function.  [x]  Yes  []  No  Objective assessment of functional ability: Moderate functional limitations   Briefly describe symptoms:   Justiss reports her R knee pain is much better today although pain rating only dropped from 9/10 last visit to 7-8/10 today.  She states her pain is no longer constant but still occurs on a  frequent basis and still significantly impacts most daily activities.  R knee AROM improving to 11-104 with supported R knee extension down to 2, demonstrating significant gains from ROM observed at eval. LEFS demonstrating 11 point improvement from eval but still demonstrating severe functional limitation at 13/80.  Jeneane will benefit from continued skilled PT to address ongoing ROM, flexibility, abnormal muscle tension, strength and balance deficits to improve mobility and activity tolerance with decreased pain interference and decreased risk for falls.   How did symptoms start: Sudden onset of pain and swelling in her R knee the day after she had been out with family  Average pain intensity:  Last 24 hours: 7/10  Past week: 7-9/10  How often does the pt experience symptoms? Frequently  How much have the symptoms interfered with usual daily activities? Quite a bit  How has condition changed since care began at this facility? Better  In general, how is the patients overall health? Fair  Onset date: early October   BACK PAIN (STarT Back Screening Tool) - (When applicable): N/A  Has your back pain spread down your leg(s) at sometime in the last 2 weeks? []  Yes   []  No Have you had pain in the shoulder or neck at sometime in the past 2 weeks? []  Yes   []  No Have you only walked short distances because of your back pain? []  Yes   []  No In the past 2 weeks, have you dressed more slowly than usual because of your back pain? []   Yes   []  No Do you think it is not really safe for person with a condition like yours to be physically active? []  Yes   []  No Have worrying thoughts been going through your mind a lot of the time? []  Yes   []  No Do you feel that your back pain is terrible and it is never going to get any better? []  Yes   []  No In general, have you stopped enjoying all the things you usually enjoy? []  Yes   []  No Overall, how bothersome has your back pain been in the last 2  weeks? []  Not at all   []  Slightly     []  Moderate   []  Very much     []  Extremely   "

## 2024-02-19 ENCOUNTER — Ambulatory Visit: Payer: Self-pay | Admitting: Family Medicine

## 2024-02-19 ENCOUNTER — Other Ambulatory Visit

## 2024-02-19 ENCOUNTER — Encounter: Payer: Self-pay | Admitting: Physical Therapy

## 2024-02-19 ENCOUNTER — Ambulatory Visit: Admitting: Physical Therapy

## 2024-02-19 DIAGNOSIS — E669 Obesity, unspecified: Secondary | ICD-10-CM

## 2024-02-19 DIAGNOSIS — M25561 Pain in right knee: Secondary | ICD-10-CM

## 2024-02-19 DIAGNOSIS — R350 Frequency of micturition: Secondary | ICD-10-CM

## 2024-02-19 DIAGNOSIS — E7800 Pure hypercholesterolemia, unspecified: Secondary | ICD-10-CM | POA: Diagnosis not present

## 2024-02-19 DIAGNOSIS — M6281 Muscle weakness (generalized): Secondary | ICD-10-CM

## 2024-02-19 DIAGNOSIS — R2681 Unsteadiness on feet: Secondary | ICD-10-CM

## 2024-02-19 DIAGNOSIS — M25661 Stiffness of right knee, not elsewhere classified: Secondary | ICD-10-CM

## 2024-02-19 DIAGNOSIS — E119 Type 2 diabetes mellitus without complications: Secondary | ICD-10-CM | POA: Diagnosis not present

## 2024-02-19 DIAGNOSIS — I10 Essential (primary) hypertension: Secondary | ICD-10-CM

## 2024-02-19 DIAGNOSIS — R2689 Other abnormalities of gait and mobility: Secondary | ICD-10-CM

## 2024-02-19 LAB — MICROALBUMIN / CREATININE URINE RATIO
Creatinine,U: 53.2 mg/dL
Microalb Creat Ratio: 138.9 mg/g — ABNORMAL HIGH (ref 0.0–30.0)
Microalb, Ur: 7.4 mg/dL — ABNORMAL HIGH (ref 0.7–1.9)

## 2024-02-19 LAB — LIPID PANEL
Cholesterol: 278 mg/dL — ABNORMAL HIGH (ref 28–200)
HDL: 77.5 mg/dL
LDL Cholesterol: 175 mg/dL — ABNORMAL HIGH (ref 10–99)
NonHDL: 200
Total CHOL/HDL Ratio: 4
Triglycerides: 124 mg/dL (ref 10.0–149.0)
VLDL: 24.8 mg/dL (ref 0.0–40.0)

## 2024-02-19 LAB — CBC WITH DIFFERENTIAL/PLATELET
Basophils Absolute: 0.1 K/uL (ref 0.0–0.1)
Basophils Relative: 0.9 % (ref 0.0–3.0)
Eosinophils Absolute: 0.1 K/uL (ref 0.0–0.7)
Eosinophils Relative: 1.1 % (ref 0.0–5.0)
HCT: 36.9 % (ref 36.0–46.0)
Hemoglobin: 12.4 g/dL (ref 12.0–15.0)
Lymphocytes Relative: 37.4 % (ref 12.0–46.0)
Lymphs Abs: 2.9 K/uL (ref 0.7–4.0)
MCHC: 33.5 g/dL (ref 30.0–36.0)
MCV: 84.6 fl (ref 78.0–100.0)
Monocytes Absolute: 0.6 K/uL (ref 0.1–1.0)
Monocytes Relative: 8.2 % (ref 3.0–12.0)
Neutro Abs: 4.1 K/uL (ref 1.4–7.7)
Neutrophils Relative %: 52.4 % (ref 43.0–77.0)
Platelets: 259 K/uL (ref 150.0–400.0)
RBC: 4.36 Mil/uL (ref 3.87–5.11)
RDW: 13.4 % (ref 11.5–15.5)
WBC: 7.9 K/uL (ref 4.0–10.5)

## 2024-02-19 LAB — URINALYSIS, ROUTINE W REFLEX MICROSCOPIC
Bilirubin Urine: NEGATIVE
Ketones, ur: NEGATIVE
Leukocytes,Ua: NEGATIVE
Nitrite: NEGATIVE
Specific Gravity, Urine: 1.01 (ref 1.000–1.030)
Total Protein, Urine: NEGATIVE
Urine Glucose: NEGATIVE
Urobilinogen, UA: 0.2 (ref 0.0–1.0)
pH: 5 (ref 5.0–8.0)

## 2024-02-19 LAB — TSH: TSH: 2.46 u[IU]/mL (ref 0.35–5.50)

## 2024-02-19 LAB — COMPREHENSIVE METABOLIC PANEL WITH GFR
ALT: 15 U/L (ref 3–35)
AST: 15 U/L (ref 5–37)
Albumin: 4.6 g/dL (ref 3.5–5.2)
Alkaline Phosphatase: 69 U/L (ref 39–117)
BUN: 21 mg/dL (ref 6–23)
CO2: 24 meq/L (ref 19–32)
Calcium: 9.7 mg/dL (ref 8.4–10.5)
Chloride: 98 meq/L (ref 96–112)
Creatinine, Ser: 0.75 mg/dL (ref 0.40–1.20)
GFR: 73.9 mL/min
Glucose, Bld: 138 mg/dL — ABNORMAL HIGH (ref 70–99)
Potassium: 4.2 meq/L (ref 3.5–5.1)
Sodium: 133 meq/L — ABNORMAL LOW (ref 135–145)
Total Bilirubin: 0.6 mg/dL (ref 0.2–1.2)
Total Protein: 7.2 g/dL (ref 6.0–8.3)

## 2024-02-19 LAB — HEMOGLOBIN A1C: Hgb A1c MFr Bld: 6.3 % (ref 4.6–6.5)

## 2024-02-19 NOTE — Therapy (Signed)
 " OUTPATIENT PHYSICAL THERAPY TREATMENT  Progress Note  Reporting mind helping period 01/09/2024 to 02/19/2024   See note below for Objective Data and Assessment of Progress/Goals.   Patient Name: [name] MRN: [id number] DOB:18-Dec-1940, 84 y.o., female Today's Date: 02/19/2024   END OF SESSION:  PT End of Session - 02/19/24 1017     Visit Number 10    Date for Recertification  04/02/24    Authorization Type [employer]    Authorization Time Period 01/09/24-04/02/24    Authorization - Visit Number 10    Authorization - Number of Visits 24    Progress Note Due on Visit 20    PT Start Time 1017    PT Stop Time 1125    PT Time Calculation (min) 68 min    Activity Tolerance Patient tolerated treatment well    Behavior During Therapy [facility] for tasks assessed/performed           Past Medical History:  Diagnosis Date   Abdominal pain in female 03/18/2010   Qualifier: Diagnosis of  By: Avram MD, NOLIA Pitts E    Anemia 06/08/2014   Anxiety    Arthritis    Spinal Osteoarthritis   Breast cancer (HCC)    Carcinoid tumor of stomach (HCC)    Cataract    Chest pain    Myoview  12/15 no ischemia.   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 09/05/2006   Qualifier: Diagnosis of  By: Wilhemina RMA, Lucy     Diabetic peripheral neuropathy (HCC) 10/29/2013   Encounter for preventative adult health care exam with abnormal findings 09/14/2013   Esophageal reflux    Gastric polyp    Fundic Gland   Gastroparesis    Headache(784.0)    Heart murmur    Echocardiogram 2/11: EF 60-65%, mild LAE, grade 1 diastolic dysfunction, aortic valve sclerosis, mean gradient 9 mm of mercury, PASP 34   Hematuria 03/16/2016   Iron  deficiency anemia, unspecified    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   Leg swelling    bilateral   Neck pain 04/22/2015   PONV (postoperative nausea and vomiting)    pt states body  temperature drops every time she has anesthesia; pt states only needs small amount of anesthesia   PSVT (paroxysmal supraventricular tachycardia)    Pure hypercholesterolemia    Recurrent UTI 01/11/2016   Renal insufficiency 06/18/2019   pt states  L kidney function very low-functions at about 20%   Stroke [facility])    tia, 2014   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   Type II or unspecified type diabetes mellitus without mention of complication, not stated as uncontrolled    Unspecified essential hypertension    Unspecified hereditary and idiopathic peripheral neuropathy 10/29/2013   Vitamin D  deficiency    Past Surgical History:  Procedure Laterality Date   BREAST BIOPSY Right 06/13/2022   US  RT BREAST BX W LOC DEV 1ST LESION IMG BX SPEC US  GUIDE 06/13/2022 [facility] MAMMOGRAPHY   BREAST LUMPECTOMY Right 07/31/2022   Procedure: RIGHT BREAST LUMPECTOMY;  Surgeon: Vernetta Berg, MD;  Location: [facility];  Service: General;  Laterality: Right;   CHOLECYSTECTOMY  1993   COLONOSCOPY  11/11/2010   diverticulosis   DILATATION & CURRETTAGE/HYSTEROSCOPY WITH RESECTOCOPE N/A 02/25/2013   Procedure: Attempted hysteroscopy with uterine perforation;  Surgeon: Bobie FORBES Crown de Charlynn FORBES Cary, MD;  Location: [facility];  Service: Gynecology;  Laterality: N/A;   ESOPHAGOGASTRODUODENOSCOPY  08/29/2010; 09/15/2010   Carcinoid tumor less than 1 cm in July 2012 not seen in August 2012 , gastritis, fundic gland polyps   ESOPHAGOGASTRODUODENOSCOPY  05/16/2011   ESOPHAGOGASTRODUODENOSCOPY  06/14/2012   EUS  12/15/2010   Procedure: UPPER ENDOSCOPIC ULTRASOUND (EUS) LINEAR;  Surgeon: Toribio Cedar, MD;  Location: [facility];  Service: Endoscopy;  Laterality: N/A;   EYE SURGERY Bilateral    Bi lateral cateracts and bi lateral laser   LAPAROSCOPY N/A 02/25/2013   Procedure: Cystoscopy and laparoscopy with fulguration of uterine serosa;  Surgeon: Bobie FORBES Crown de Charlynn FORBES Cary, MD;  Location: [facility];  Service:  Gynecology;  Laterality: N/A;   TONSILLECTOMY     Patient Active Problem List   Diagnosis Date Noted   Acute pain of right knee 11/20/2023   Precordial chest pain 12/24/2022   Carcinoma of breast upper outer quadrant, right (HCC) 07/04/2022   SVT (supraventricular tachycardia) 06/21/2022   Left-sided weakness 04/02/2022   Unsteady gait 04/02/2022   Oral lesion 03/08/2021   Sun-damaged skin 03/08/2021   Thiamine  deficiency 11/01/2020   Trigeminal neuralgia 08/21/2020   Pedal edema 08/21/2020   Chronic left-sided back pain 03/28/2020   Nocturia 03/28/2020   Left-sided headache 03/28/2020   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   Renal insufficiency 06/18/2019   Educated about COVID-19 virus infection 05/15/2019   Atrophic vaginitis 01/20/2019   Stenosis of carotid artery 11/12/2018   Anxiety 10/13/2018   Referred otalgia of left ear 04/23/2018   Chronic throat clearing 04/23/2018   Sinusitis 10/11/2017   Headache 07/12/2017   Dizzy spells 11/21/2016   Constipation 11/21/2016   Nonrheumatic aortic valve stenosis 05/23/2016   Aortic atherosclerosis 05/23/2016   Hematuria 03/16/2016   Recurrent UTI 01/11/2016   Pain of upper abdomen 06/06/2015   Neck pain 04/22/2015   Ear pain 02/28/2015   Abnormal urine 11/21/2014   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   Anemia 06/08/2014   RLS (restless legs syndrome) 11/23/2013   Diabetic peripheral neuropathy (HCC) 10/29/2013   Left-sided thoracic back pain 10/06/2013   Encounter for preventative adult health care exam with abnormal findings 09/14/2013   Iron  deficiency anemia    Status post laparoscopy 02/25/2013   Hyponatremia 01/09/2013   GERD (gastroesophageal reflux disease) 01/09/2013   Amaurosis fugax of left eye 10/16/2012   Low back pain 06/03/2012   Vitamin D  deficiency 03/11/2012   Bilateral hand pain 10/20/2011   Encounter for long-term (current) use of other medications 10/20/2011   IBS (irritable  bowel syndrome) 08/14/2011   TIA (transient ischemic attack) 02/10/2011   Abnormal brain CT 01/19/2011   Allergic rhinitis 10/01/2010   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   Preventative health care 07/15/2010   FUNDIC GLAND POLYPS OF STOMACH 03/18/2010   Abdominal pain in female 03/18/2010   Left hip pain 03/17/2009   SYSTOLIC MURMUR 03/02/2009   Paroxysmal supraventricular tachycardia 01/12/2009   PLANTAR FASCIITIS 06/08/2008   CHEST PAIN 05/18/2008   Gastroparesis 12/18/2007   HYPERCHOLESTEROLEMIA 06/11/2007   Type 2 diabetes mellitus with obesity 09/05/2006   Essential hypertension 09/05/2006    PCP: Domenica Harlene LABOR, MD   REFERRING PROVIDER: Charles Redell LABOR, DO  REFERRING DIAG: M17.11 (ICD-10-CM) - Primary osteoarthritis of right knee   THERAPY DIAG:  Muscle weakness (generalized)  Acute pain of right knee  Stiffness of right knee, not elsewhere classified  Other abnormalities of gait and mobility  Unsteadiness on feet  RATIONALE FOR EVALUATION AND TREATMENT: Rehabilitation  ONSET DATE: early October  NEXT MD VISIT: TBD   SUBJECTIVE:                                                                                                                                                                                                         SUBJECTIVE STATEMENT: Patient reports pain in R knee and shin today. Pt reports swelling with 5/10 pain. Pt brought in a brace from [facility] and wanted to try but was advised it was too small. Pt reports improved pain and swelling with continued care.   EVAL: Pt reports on Sunday in early October she was out for dinner with her family and the next day her knee was swollen and she could barely put weight on it.  She waited about 2 weeks before going to see the doctor.  MD drew some fluid off and gave her a cortisone shot which helped for ~1 week but now the pain is back and she feels crippled. Pain prevents her from walking  much or going out.  Difficulty putting on her clothes and shoes.  She also notes a burning feeling in her knee and anterior leg.  She has variable swelling in her knee - better today after taking lasix  yesterday.  She states she has to use a cane at home but did not bring it with her to PT today.  She states now her R hip and back are also hurting due to limping for her knee.  Pain prevents her from sleeping, esp on her sides, which is her preferred sleeping position.  PAIN: Are you having pain? Yes: NPRS scale: up to 4-5/10  Pain location: R knee, lower thigh and upper lower leg  Pain description: sharp  Aggravating factors: walking, prolonged standing, lower body dressing  Relieving factors: Voltaren  gel, Tylenol , ice - nothing give much relief  PERTINENT HISTORY:  Extensive PMH including chronic back and neck pain, OA, osteoporosis, DM-II, HTN, h/o TIA, diabetic peripheral neuropathy, PVD, headaches, sleep dysfunction, anxiety, R breast cancer s/p lumpectomy 07/31/22, SVT (refer to above problem list and past medical/surgical history for full PMH)   PRECAUTIONS: Fall  RED FLAGS: None  WEIGHT BEARING RESTRICTIONS: No  FALLS:  Has patient fallen in last 6 months? No  LIVING ENVIRONMENT: Lives with: lives alone Lives in: House Stairs: No Has following equipment at home: Single point cane, shower chair, and Grab bars  OCCUPATION: Retired  PLOF: Independent, Needs assistance with homemaking, and Leisure: read and play card games on her phone, traveling,  and housekeeping/cleaning service ~1x/month   PATIENT GOALS: Just to walk freely with no pain. Be able to climb stairs.   OBJECTIVE: (objective measures completed at initial evaluation unless otherwise dated)  DIAGNOSTIC FINDINGS:  11/27/23 - DG RIGHT KNEE - COMPLETE 4+ VIEW CLINICAL DATA:  Right knee pain. Primary osteoarthritis of right knee. Acute knee pain.   FINDINGS: The alignment and joint spaces are preserved. Mild  tricompartmental peripheral spurring and spurring of the tibial spines. Small knee joint effusion. No fracture, erosion, or focal bone abnormality. Frontal view of the left knee included for comparison demonstrates tibiofemoral osteoarthritis.   IMPRESSION: Mild tricompartmental osteoarthritis of the right knee. Small knee joint effusion.  PATIENT SURVEYS:  LEFS  Extreme difficulty/unable (0), Quite a bit of difficulty (1), Moderate difficulty (2), Little difficulty (3), No difficulty (4) Survey date:  01/09/24  02/04/24  Any of your usual work, housework or school activities 0 1  2. Usual hobbies, recreational or sporting activities 0 0  3. Getting into/out of the bath 1 1  4. Walking between rooms 1 3  5. Putting on socks/shoes 0 0  6. Squatting  0 0  7. Lifting an object, like a bag of groceries from the floor 0 1  8. Performing light activities around your home 0 1  9. Performing heavy activities around your home 0 0  10. Getting into/out of a car 0 0  11. Walking 2 blocks 0 0  12. Walking 1 mile 0 0  13. Going up/down 10 stairs (1 flight) 0 0  14. Standing for 1 hour 0 1  15.  sitting for 1 hour 0 4  16. Running on even ground 0 0  17. Running on uneven ground 0 0  18. Making sharp turns while running fast 0 0  19. Hopping  0 0  20. Rolling over in bed 0 1  Score total:  2/80 13/80  Functional limitation: Severe Severe     COGNITION: Overall cognitive status: Within functional limits for tasks assessed    SENSATION: WFL Pt reports burning and itching in her knee at times  EDEMA:  Circumferential:   Mid patella: R = 46.0 cm L = 43.5 cm  6 cm above: R = 49.0 cm L = 45.0 cm  6 cm below: R = 43.0 cm L = 40.0 cm  POSTURE:  rounded shoulders, forward head, and weight shift left  PALPATION: Very TTP over patella and anterior knee - increased edema apparent with decreased patellar mobility and poor quad activation (especially VMO) with quad set  MUSCLE LENGTH:  TBA next visit Hamstrings:  ITB:  Piriformis:  Hip flexors:  Quads:  Heelcord:   LOWER EXTREMITY ROM:  Active ROM Right eval Left eval R 02/04/24 RLE R   02/19/24  Knee flexion 82 120 104 105 107  Knee extension 18 LAQ /  8 supported 0 11 LAQ /  2 supported -2 to 3 degrees in supine 8 LAQ /  2 supported  (Blank rows = not tested)  LOWER EXTREMITY MMT: (Tested in sitting on eval)  MMT Right eval Left eval R 02/19/24 L 02/19/24  Hip flexion 3 3+ 3+ 4  Hip extension 2 3- 2+ 3-  Hip abduction 3+ 4- 4- 4  Hip adduction 3+ 4- 4- 4  Hip internal rotation   4 4+  Hip external rotation   3+ 4-  Knee flexion 3+ 4- 4- 4  Knee extension 3- 4 4- 4  Ankle dorsiflexion  3+ 4- 4- 4  Ankle plantarflexion      Ankle inversion      Ankle eversion       (Blank rows = not tested)  FUNCTIONAL TESTS: 01/16/24 5 times sit to stand: 47.84 sec  Timed up and go (TUG): 44.75 sec with SPC  10 meter walk test: 54.47 sec with SPC  Gait speed = 0.60 ft/sec with SPC  GAIT: Distance walked: 50' Assistive device utilized: Single point cane and None Level of assistance: SBA Gait pattern: step to pattern, decreased step length- Left, decreased stride length, decreased hip/knee flexion- Right, antalgic, and lateral lean- Left Comments: Increased instability evident with patient reporting she uses SPC at home but did not bring it with her to PT   TODAY'S TREATMENT:    02/19/24 MANUAL THERAPY: To promote normalized muscle tension, improved flexibility, and reduced pain utilizing connective tissue massage, therapeutic massage, manual TP therapy, and myofascial release.  STM/DTM and IASTM with edge tool and roller stick to R glutes, piriformis, TLF, distal hip flexors, proximal lateral quads, and medial HS  THERAPEUTIC EXERCISE: To improve strength and endurance.  Demonstration, verbal and tactile cues throughout for technique. Hooklying R SLR 2 x 5 S/L R clam x 10 S/L R hip ABD x 10 (slight PT assisted  AAROM) Standing R hip ABD x 10 Standing R hip ABD/ext x 10 - pt reports increased pain in R medial knee Standing R hip ext x 10  THERAPEUTIC ACTIVITIES: To improve functional performance.  Demonstration, verbal and tactile cues throughout for technique.  5xSTS = 19.06 sec TUG = 31.22 sec : 19.22 sec w/o AD 21.88 sec with SPC on L Gait speed: 1.71 ft/sec w/o AD 1.50 ft/sec with SPC  SELF CARE:  Review of progress with PT, status of goals, and plans for ongoing PT POC requesting patient input and feedback to ensure PT is addressing all of her concerns.    02/14/24 NuStep L4 6 minutes (UE/LE)  THERAPEUTIC EXERCISE  All the following exercises were performed to fatigue: Standing alt hip abduct, extension Seated resisted alt abduction red band  Seated Marches with red band  Seated alt resisted ankle pumps with blue band Seated alt hip flexion with knee extended Glute bridges with bolster under calf  Alt step ups on 6in block against the counter with light guarding hands free  THERAPEUTIC EXERCISE: To improve strength, and increased endurance to work towards pt goal of walking up 6 steps at family visits. Also ankle pumps with band resistance to increase blood flow to help reduce swelling lower legs  PT EDUCATION: Verbally and physically guided pt to step with L foot first when ascending stairs when she goes to visit family. Advised pt to continue HEP to help with swelling and pain in knee.    02/12/24: somewhat abbreviated session as pt  7 min late: Supine , Knees over bolster, deep tissue massage R distal lateral quads , Itband Nustep L 4, x 5 min  Standing with 2 # cuff wts ankles for alt hip abd, alt hip ext ,alt hamstring curls, 2 x 10 each Seated marches alt with red t band 2 x 10 Seated SLR 2 x 10 each leg    02/06/24 THERAPEUTIC EXERCISE: To improve strength, endurance, and ROM.  Demonstration, verbal and tactile cues throughout for technique.  NuStep - L3 x 6'  (UE/LE)  Standing hip abd 2# x 15 RLE Standing hip ext x2# x 15 RLE Standing knee flexion 2# x 15  RLE Seated knee flexion GTB x 2/10 RLE Seated hip abd/ER GTB x 20 BLE Supine bent knee fallouts GTB x 20 BLE Supine SLR ham stretch w/ PT assist x 1' x 3 RLE Supine cross over piriformis stretch x 1' x 3 RLE SL clam x 2/10 RLE Circumference measures at mid patella:  L knee = 42.5 cm;   R knee = 44 cm  NEUROMUSCULAR RE-EDUCATION: To improve strength and balance. Sidestepping GTB  to knees x 2 laps at counter  MANUAL THERAPY: To promote reduced pain utilizing myofascial release. MFR to R lateral hamstrings in SLR stretch position; MFR to R distal IT in sidelying via knuckle massage   02/04/2024  THERAPEUTIC EXERCISE: To improve strength, endurance, and ROM.  Demonstration, verbal and tactile cues throughout for technique.  NuStep - L2 x 6' (UE/LE)  SELF CARE:   Provided instruction in application of Galvaran Knee Brace ([email or web address]) which patient's sister recommended to her and she brought with her to the therapy session.  Brace seemed a little snug and in the short trial of wearing it during therapy session seem to create some friction irritation along the edges of the brace and the seems with patient not really noting any difference in knee pain while wearing the brace, therefore recommended that she would probably be better off not using the brace.  MANUAL THERAPY: To promote normalized muscle tension, improved flexibility, improved joint mobility, increased ROM, and reduced pain utilizing connective tissue massage, therapeutic massage, manual TP therapy, and percussion massage with massage gun.  STM/DTM and percussion massage with massage gun to R glutes, piriformis, ITB, lateral quads and hamstrings  THERAPEUTIC ACTIVITIES: To improve functional performance.  Demonstration, verbal and tactile cues throughout for technique. LEFS: 13 / 80 = 16.3 % R knee ROM assessment - see  ROM table above   01/29/24: Supine for AP jt mobs L prox tibia with thigh over bolster, 2 bouts 45 sec each, gentle, to improve R knee flexion motion  Mob with movement, lat glides R hip while assisting with R hip IR/ER and flex Side lying L with R thigh over 6 roll, for theragun massage R lateral hip musculature Supine with bolster under knees , manually resisted R hip IR, and then manually resisted R hip IR , 15 reps each  Ankle pumps with theraband for R ankle plantarflexion motion.   01/22/24 Nustep L1x54min UE/LE Seated R LE PF GTB X 10 Seated hip ABD X 10 B Seated ball squeeze x 10  Seated ball squeeze + LAQ x 10 Seated R hamstring stretch 2x30 sec Seated R adductor stretch 2x30 sec Seated R foot on 9' stool piriformis stretch 2x30 sec   01/18/24 THERAPEUTIC EXERCISE: To improve strength, endurance, ROM, and flexibility.  Demonstration, verbal and tactile cues throughout for technique.  NuStep - L3 x 6' (UE/LE) Seated hip ADD isometric ball squeeze 10 x 5 Seated R hip adductor stretch 2 x 30 Seated hip ADD ball squeeze + R LAQ x 10 Seated hip hinge R HS stretch 2 x 30 Seated R knee flexion/extension heel slides on slider 2 x 10 Supine manual L hip adductor stretch 2 x 30 Supine manual L HS stretch 2 x 30 Supine manual L ITB stretch 2 x 30 Supine R quad set into towel roll 10 x 5  MANUAL THERAPY: To promote normalized muscle tension, improved flexibility, improved joint mobility, increased ROM, reduced pain, and reduced edema utilizing joint mobilization, connective tissue massage, therapeutic massage, manual TP therapy,  and kinesiotaping. Kinesiotaping to R knee (blue Kinesiotex Classic) - chondromalacia pattern with slight increased stretch on lateral strip to promote medial patellar glide R patellar mobilization all directions - very limited mobility with some increased pain reported at points of PT pressure STM/DTM and manual TPR to R hip adductors, quads and  ITB   01/16/24  THERAPEUTIC ACTIVITIES: To improve functional performance.  Demonstration, verbal and tactile cues throughout for technique. 5xSTS = 47.84 sec TUG = 44.75 sec with SPC = 54.47 sec with SPC Gait speed = 0.60 ft/sec with SPC  GAIT TRAINING: To normalize gait pattern and improve safety with SPC. 79' with SPC and SBA of PT - repeated verbal and visual cues/demonstration for step through pattern and coordination of SPC with R LE, however patient continues to demonstrate step to pattern  THERAPEUTIC EXERCISE: To improve strength, ROM, and flexibility.  Demonstration, verbal and tactile cues throughout for technique. Supine quad set on towel roll 10 x 5 Supine hip ABD/ADD x 10 Manual HS stretch by PT 2 x 30 Mod thomas quad/hip flexor stretch 2 x 30-60 Attempted R hip ADD butterfly stretch but deferred d/t increased pain  B HS curl/AAROM R knee flexion with heels on peanut ball and strap assist for R LE 2 x 10  MANUAL THERAPY: To promote normalized muscle tension, improved flexibility, improved joint mobility, increased ROM, and reduced pain utilizing joint mobilization, connective tissue massage, therapeutic massage, and kinesiotaping.  STM and gentle IASTM with foam roller to R quads, hip adductors and ITB R patellar mobilization all directions - very limited mobility however able to reduce pain with heel slides when PT providing medial patellar glide Kinesiotaping to R knee (sensitive skin tape - light blue Kinesiotex Gold) - chondromalacia pattern with slight increased stretch on lateral strip to promote medial patellar glide  MODALITIES:  Ice pack to R knee x 10' in sitting with LE elevated on 9 stool to reduce post-exercise pain and edema   01/09/24 - Eval SELF CARE:   Reviewed eval findings and role of PT in addressing identified deficits as well as instruction in initial HEP (see below).  Provided instruction in gait training with SPC in L hand to offset R  knee.  Patient only able to demonstrate step to pattern despite repeated verbal cues and demonstration of step through pattern.  Further training indicated next visit. Discussed taking her lasix  more frequently (prescribed for daily PRN) to help with LE edema as she notes slight improvement in LE edema and knee pain after she takes the lasix .  Also explained that her potassium supplement should be taken with the lasix  to offset the loss of potassium as a result of the lasix .    PATIENT EDUCATION:  Education details: updated HEP and provided handouts    Person educated: Patient Education method: Explanation, Demonstration, and Verbal cues Education comprehension: verbalized understanding and needs further education  HOME EXERCISE PROGRAM: Access Code: [email or web address] URL: [email or web address] Date: 02/06/2024 Prepared by: Garnette Montclair  Exercises - Supine Quad Set on Towel Roll  - 2-3 x daily - 7 x weekly - 2 sets - 10 reps - 5 sec hold - Seated Heel Slide  - 2-3 x daily - 7 x weekly - 2 sets - 10 reps - 3 sec hold - Seated Hip Adduction Isometrics with Ball  - 2 x daily - 7 x weekly - 2 sets - 10 reps - 3-5 sec hold - Seated Long Arc Quad with Hip Adduction  -  2 x daily - 7 x weekly - 2 sets - 10 reps - 3 sec hold - Seated Hip Adductor Stretch  - 2 x daily - 7 x weekly - 3 reps - 30 sec hold - Seated Hamstring Stretch  - 2 x daily - 7 x weekly - 3 reps - 30 sec hold - Clamshell  - 1 x daily - 7 x weekly - 1 sets - 10 reps - Seated Hip Abduction with Resistance  - 1 x daily - 7 x weekly - 2 sets - 10 reps - Supine Piriformis Stretch  - 1 x daily - 7 x weekly - 1 sets - 2 reps - 1 min hold - Seated Hamstring Curl with Anchored Resistance  - 1 x daily - 7 x weekly - 2 sets - 10 reps  ASSESSMENT:  CLINICAL IMPRESSION: [name] reports 60% improvement in overall pain since start of PT, however she continues to report R knee pain as well as R lateral hip pain.  R hip pain  prevents her from sleeping on her side.  She continues to use her SPC as recommended but would like to be able to get to the point where she does not need it.  MMT today revealing overall strength gains in BLE, however R LE remains weaker than L.  Gains noted across all standardized balance testing although values remain shy of goal levels and PLOF.  Discussed possibility of transitioning to her HEP due to previous concerns about the cost of her co-pay, however [name] expressing a desire to continue with PT as she feels like she still needs to work on activities such as stair negotiation and her ongoing pain issues in her R knee and hip.  [name] is progressing toward her PT goals will benefit from continued skilled PT to address ongoing abnormal muscle tension, flexibility, strength, and balance deficits to improve mobility and activity tolerance with decreased pain interference and decreased risk for falls.   EVAL: [name] is a 84 y.o. female who was referred to physical therapy for evaluation and treatment for acute R knee pain secondary to osteoarthritis.  Patient reports onset of R knee pain beginning in early October without known injury or precipitating event, other than having gone out to dinner with her family the night before.  Pain is worse with any standing or walking, as well as lower body dressing.  Patient has deficits in R knee ROM, B LE flexibility, B LE strength, abnormal posture, antalgic gait pattern with evidence of gait instability, and TTP with abnormal muscle tension which are interfering with ADLs and are impacting quality of life.  On LEFS patient scored 2/80 demonstrating severe functional limitation.  Further standardized balance testing to be completed next visit.  [name] will benefit from skilled PT to address above deficits to improve mobility and activity tolerance with decreased pain interference.  OBJECTIVE IMPAIRMENTS: Abnormal gait, decreased activity  tolerance, decreased balance, decreased endurance, decreased knowledge of condition, decreased knowledge of use of DME, decreased mobility, difficulty walking, decreased ROM, decreased strength, decreased safety awareness, increased edema, increased fascial restrictions, impaired perceived functional ability, increased muscle spasms, impaired flexibility, improper body mechanics, postural dysfunction, and pain.   ACTIVITY LIMITATIONS: carrying, lifting, bending, sitting, standing, squatting, sleeping, stairs, transfers, bed mobility, bathing, dressing, and locomotion level  PARTICIPATION LIMITATIONS: meal prep, cleaning, laundry, driving, shopping, community activity, and church  PERSONAL FACTORS: Age, Behavior pattern, Fitness, Past/current experiences, Time since onset of injury/illness/exacerbation, and 3+ comorbidities: Extensive PMH  including chronic back and neck pain, OA, osteoporosis, DM-II, HTN, h/o TIA, diabetic peripheral neuropathy, PVD, headaches, sleep dysfunction, anxiety, R breast cancer s/p lumpectomy 07/31/22, SVT  are also affecting patient's functional outcome.   REHAB POTENTIAL: Good  CLINICAL DECISION MAKING: Evolving/moderate complexity  EVALUATION COMPLEXITY: Moderate   GOALS: Goals reviewed with patient? Yes  SHORT TERM GOALS: Target date: 02/20/2024  Patient will be independent with initial HEP. Baseline: HEP initiated on eval 01/16/24 - Limited review today 01/18/24 - full HEP review with slight modification/update 02/04/24 - Pt denies concerns, but not formally reviewed Goal status: MET - 02/19/24  2.  Patient will report at least 25% improvement in R knee pain to improve QOL. Baseline: 10/10 Goal status: MET - 02/04/24 - 30-40% improvement in R knee pain  3.  Patient will demonstrate improved R knee AROM to >/= 10-95 to allow for normal gait and stair mechanics. Baseline: Refer to above LE ROM table - R knee AROM 18-82, with 8 extension lacking when knee  supported  02/04/24: R knee AROM 11-104, with 2 lacking in supported extension Goal status: MET - 02/19/24 - R knee AROM 8-107, with 2 lacking in supported extension  4.  Complete standardized balance testing and update POC/goals as indicated. Baseline:  Goal status: MET - 01/16/24 - 5xSTS, TUG and completed   LONG TERM GOALS: Target date: 04/02/2024  Patient will be independent with advanced/ongoing HEP to improve outcomes and carryover.  Baseline: 02/06/24 advanced and provided written handouts on new exercises Goal status: IN PROGRESS  2.  Patient will report at least 50% improvement in R knee pain to improve QOL. Baseline: 10/10 02/06/24:  7/10 Goal status: IN PROGRESS - 02/19/24 - 60% improved  3.  Patient will demonstrate improved R knee AROM to >/= 5-110 to allow for normal gait and stair mechanics. Baseline: Refer to above LE ROM table - R knee AROM 18-82, with 8 extension lacking when knee supported  02/06/24:  see updated tables Goal status: IN PROGRESS - 02/19/24 - R knee AROM 8-107, with 2 lacking in supported extension  4.  Patient will demonstrate improved B LE strength to >/= 4 to 4+/5 for improved stability and ease of mobility. Baseline: Refer to above LE MMT table Goal status: IN PROGRESS - 02/19/24 - R LE remains weaker than L, esp proximally  5.  Patient will be able to ambulate 600' with LRAD and normal gait pattern without increased pain to access community.  Baseline: Antalgic step to pattern with SPC on L, worsening antalgic gait pattern without AD Goal status: INITIAL  6. Patient will report >/= 30/80 on LEFS (MCID = 9 pts) to demonstrate improved functional ability. Baseline: 2 / 80 = 2.5 % Goal status: IN PROGRESS - 02/04/24 - 13 / 80 = 16.3 %  7.  Patient will demonstrate at least 19/24 on DGI to decrease risk of falls. Baseline: NT on eval, 21/24 as of DC from PT on 07/09/23 Goal status: INITIAL   8.  Patient will improve 5x STS time to  </= 25 seconds to demonstrate improved functional strength and transfer efficiency. Baseline: 47.84 sec - 01/16/24, 22.59 sec as of DC from PT on 07/09/23 Goal status: IN PROGRESS - 02/19/24 - 31.22 sec  9.  Patient will demonstrate decreased TUG time to </= 18 sec to decrease risk for falls with transitional mobility. Baseline: 01/16/24 - 44.75 sec with SPC, 13.5 sec w/o AD as of 05/03/23 Goal status: IN PROGRESS - 02/19/24 -  19.06 sec  10.  Patient will demonstrate gait speed of >/= 1.81 ft/sec to be a safe limited community ambulator with decreased risk for recurrent falls.  Baseline: 01/16/24 - 0.60 ft/sec with SPC, 2.78 ft/sec w/o AD as of 05/15/23 Goal status: IN PROGRESS - 02/19/24 - 1.71 ft/sec w/o AD; 1.50 ft/sec with SPC   PLAN:  PT FREQUENCY: 2x/week  PT DURATION: 12 weeks  PLANNED INTERVENTIONS: 97164- PT Re-evaluation, 97750- Physical Performance Testing, 97110-Therapeutic exercises, 97530- Therapeutic activity, 97112- Neuromuscular re-education, 97535- Self Care, 02859- Manual therapy, [phone]- Gait training, [id number]- Aquatic Therapy, [phone]- Electrical stimulation (unattended), 97016- Vasopneumatic device, [id number]- Ultrasound, [id number]- Ionotophoresis 4mg /ml Dexamethasone , 79439 (1-2 muscles), 20561 (3+ muscles)- Dry Needling, Patient/Family education, Balance training, Stair training, Taping, Joint mobilization, DME instructions, Cryotherapy, and Moist heat  PLAN FOR NEXT SESSION: standardized balance testing - DGI; gently progress R knee ROM; LE strengthening with emphasis on quads and hip stability; review and update HEP as indicated; manual therapy and/or modalities PRN for pain management  Elijah CHRISTELLA Hidden, PT 02/19/2024, 11:43 AM     Date of referral: 12/20/2023 Referring provider: Charles Redell LABOR, DO Referring diagnosis? M17.11 (ICD-10-CM) - Primary osteoarthritis of right knee Treatment diagnosis? (if different than referring diagnosis)  Muscle weakness (generalized)  Acute pain  of right knee  Stiffness of right knee, not elsewhere classified  Other abnormalities of gait and mobility  Unsteadiness on feet  What was this (referring dx) caused by? Arthritis  Nature of Condition: Initial Onset (within last 3 months)   Laterality: Rt  Current Functional Measure Score: LEFS 13 / 80 = 16.3 %  Objective measurements identify impairments when they are compared to normal values, the uninvolved extremity, and prior level of function.  [x]  Yes  []  No  Objective assessment of functional ability: Moderate functional limitations   Briefly describe symptoms:   Leeah reports her R knee pain is much better today although pain rating only dropped from 9/10 last visit to 7-8/10 today.  She states her pain is no longer constant but still occurs on a frequent basis and still significantly impacts most daily activities.  R knee AROM improving to 11-104 with supported R knee extension down to 2, demonstrating significant gains from ROM observed at eval. LEFS demonstrating 11 point improvement from eval but still demonstrating severe functional limitation at 13/80.  Carman will benefit from continued skilled PT to address ongoing ROM, flexibility, abnormal muscle tension, strength and balance deficits to improve mobility and activity tolerance with decreased pain interference and decreased risk for falls.   How did symptoms start: Sudden onset of pain and swelling in her R knee the day after she had been out with family  Average pain intensity:  Last 24 hours: 7/10  Past week: 7-9/10  How often does the pt experience symptoms? Frequently  How much have the symptoms interfered with usual daily activities? Quite a bit  How has condition changed since care began at this facility? Better  In general, how is the patients overall health? Fair  Onset date: early October   BACK PAIN (STarT Back Screening Tool) - (When applicable): N/A  Has your back pain spread down  your leg(s) at sometime in the last 2 weeks? []  Yes   []  No Have you had pain in the shoulder or neck at sometime in the past 2 weeks? []  Yes   []  No Have you only walked short distances because of your back pain? []  Yes   []  No In  the past 2 weeks, have you dressed more slowly than usual because of your back pain? []  Yes   []  No Do you think it is not really safe for person with a condition like yours to be physically active? []  Yes   []  No Have worrying thoughts been going through your mind a lot of the time? []  Yes   []  No Do you feel that your back pain is terrible and it is never going to get any better? []  Yes   []  No In general, have you stopped enjoying all the things you usually enjoy? []  Yes   []  No Overall, how bothersome has your back pain been in the last 2 weeks? []  Not at all   []  Slightly     []  Moderate   []  Very much     []  Extremely   "

## 2024-02-20 LAB — URINE CULTURE
MICRO NUMBER:: 17462125
Result:: NO GROWTH
SPECIMEN QUALITY:: ADEQUATE

## 2024-02-20 NOTE — Progress Notes (Signed)
 Patient reviewed via MyChart.

## 2024-02-21 ENCOUNTER — Ambulatory Visit

## 2024-02-23 NOTE — Assessment & Plan Note (Signed)
 hgba1c acceptable, minimize simple carbs. Increase exercise as tolerated. Continue current meds

## 2024-02-23 NOTE — Assessment & Plan Note (Signed)
 Hydrate and monitor

## 2024-02-23 NOTE — Assessment & Plan Note (Signed)
 Patient encouraged to maintain heart healthy diet, regular exercise, adequate sleep. Consider daily probiotics. Take medications as prescribed. Labs ordered and reviewed. Given and reviewed copy of ACP documents from [employer] and encouraged to complete and return she has aged out of colonoscopy, MGM and Pap

## 2024-02-23 NOTE — Assessment & Plan Note (Signed)
 No malignant symptoms when it occurred back in 2012. 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

## 2024-02-23 NOTE — Assessment & Plan Note (Signed)
 Encourage heart healthy diet such as MIND or DASH diet, increase exercise, avoid trans fats, simple carbohydrates and processed foods, consider a krill or fish or flaxseed oil cap daily. Tolerating Rosuvastatin

## 2024-02-23 NOTE — Assessment & Plan Note (Signed)
 Well controlled, no changes to meds. Encouraged heart healthy diet such as the DASH diet and exercise as tolerated.

## 2024-02-23 NOTE — Progress Notes (Unsigned)
 "  Subjective:    [name], female    DOB: 1940/12/08, 84 y.o.   MRN: [id number]  No chief complaint on file.   HPI Discussed the use of AI scribe software for clinical note transcription with the patient, who gave verbal consent to proceed.  History of Present Illness     Past Medical History:  Diagnosis Date   Abdominal pain in female 03/18/2010   Qualifier: Diagnosis of  By: Avram MD, NOLIA Pitts E    Anemia 06/08/2014   Anxiety    Arthritis    Spinal Osteoarthritis   Breast cancer (HCC)    Carcinoid tumor of stomach (HCC)    Cataract    Chest pain    Myoview  12/15 no ischemia.   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 09/05/2006   Qualifier: Diagnosis of  By: Wilhemina RMA, Lucy     Diabetic peripheral neuropathy (HCC) 10/29/2013   Encounter for preventative adult health care exam with abnormal findings 09/14/2013   Esophageal reflux    Gastric polyp    Fundic Gland   Gastroparesis    Headache(784.0)    Heart murmur    Echocardiogram 2/11: EF 60-65%, mild LAE, grade 1 diastolic dysfunction, aortic valve sclerosis, mean gradient 9 mm of mercury, PASP 34   Hematuria 03/16/2016   Iron  deficiency anemia, unspecified    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   Leg swelling    bilateral   Neck pain 04/22/2015   PONV (postoperative nausea and vomiting)    pt states body temperature drops every time she has anesthesia; pt states only needs small amount of anesthesia   PSVT (paroxysmal supraventricular tachycardia)    Pure hypercholesterolemia    Recurrent UTI 01/11/2016   Renal insufficiency 06/18/2019   pt states  L kidney function very low-functions at about 20%   Stroke [facility])    tia, 2014   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   Type II or unspecified type diabetes mellitus without mention of complication, not stated as uncontrolled    Unspecified  essential hypertension    Unspecified hereditary and idiopathic peripheral neuropathy 10/29/2013   Vitamin D  deficiency     Past Surgical History:  Procedure Laterality Date   BREAST BIOPSY Right 06/13/2022   US  RT BREAST BX W LOC DEV 1ST LESION IMG BX SPEC US  GUIDE 06/13/2022 [facility] MAMMOGRAPHY   BREAST LUMPECTOMY Right 07/31/2022   Procedure: RIGHT BREAST LUMPECTOMY;  Surgeon: Vernetta Berg, MD;  Location: [facility];  Service: General;  Laterality: Right;   CHOLECYSTECTOMY  1993   COLONOSCOPY  11/11/2010   diverticulosis   DILATATION & CURRETTAGE/HYSTEROSCOPY WITH RESECTOCOPE N/A 02/25/2013   Procedure: Attempted hysteroscopy with uterine perforation;  Surgeon: Bobie FORBES Crown de Charlynn FORBES Cary, MD;  Location: [facility];  Service: Gynecology;  Laterality: N/A;   ESOPHAGOGASTRODUODENOSCOPY  08/29/2010; 09/15/2010   Carcinoid tumor less than 1 cm in July 2012 not seen in August 2012 , gastritis, fundic gland polyps   ESOPHAGOGASTRODUODENOSCOPY  05/16/2011   ESOPHAGOGASTRODUODENOSCOPY  06/14/2012   EUS  12/15/2010   Procedure: UPPER ENDOSCOPIC ULTRASOUND (EUS) LINEAR;  Surgeon: Toribio Cedar, MD;  Location: [facility];  Service: Endoscopy;  Laterality: N/A;   EYE SURGERY Bilateral    Bi lateral cateracts and bi lateral laser   LAPAROSCOPY N/A 02/25/2013   Procedure: Cystoscopy and laparoscopy with fulguration of uterine serosa;  Surgeon: Bobie  E Amundson de Charlynn FORBES Cary, MD;  Location: [facility];  Service: Gynecology;  Laterality: N/A;   TONSILLECTOMY      Family History  Problem Relation Age of Onset   Diabetes Mother    Stroke Father        deceased age 32   Heart disease Sister        deceased MI age 76   Diabetes Sister    Heart disease Sister    Hypertension Sister    Hyperlipidemia Sister    Diabetes Sister    Heart disease Sister    Hypertension Sister    Hyperlipidemia Sister    Heart disease Brother        deceased MI age 11    Intellectual disability Brother    Diabetes Brother    Heart disease Brother    Hypertension Brother    Hyperlipidemia Brother    Diabetes Maternal Grandmother    Hypertension Paternal Grandmother    Colon cancer Neg Hx    Esophageal cancer Neg Hx    Stomach cancer Neg Hx    Rectal cancer Neg Hx     Social History   Socioeconomic History   Marital status: Widowed    Spouse name: Not on file   Number of children: 0   Years of education: college   Highest education level: Not on file  Occupational History   Occupation: retired  Tobacco Use   Smoking status: Never   Smokeless tobacco: Never   Tobacco comments:    Never used tobacco  Vaping Use   Vaping status: Never Used  Substance and Sexual Activity   Alcohol  use: Yes    Comment: ocassional glass of wine   Drug use: No   Sexual activity: Not Currently    Partners: Male    Birth control/protection: Post-menopausal    Comment: lives alone, no dietary restrictions except avoid fresh veg, fruit, whole grains  Other Topics Concern   Not on file  Social History Narrative   Patient was married ([name]) - widow   Patient does not have any children.   Patient is right-handed.   Patient has a BA degree.   One caffeine drink daily    Social Drivers of Health   Tobacco Use: Low Risk (02/19/2024)   Patient History    Smoking Tobacco Use: Never    Smokeless Tobacco Use: Never    Passive Exposure: Not on file  Financial Resource Strain: Low Risk (10/23/2023)   Overall Financial Resource Strain (CARDIA)    Difficulty of Paying Living Expenses: Not very hard  Food Insecurity: No Food Insecurity (10/23/2023)   Epic    Worried About Programme Researcher, Broadcasting/film/video in the Last Year: Never true    Ran Out of Food in the Last Year: Never true  Transportation Needs: No Transportation Needs (10/23/2023)   Epic    Lack of Transportation (Medical): No    Lack of Transportation (Non-Medical): No  Physical Activity:  Insufficiently Active (10/23/2023)   Exercise Vital Sign    Days of Exercise per Week: 7 days    Minutes of Exercise per Session: 20 min  Stress: No Stress Concern Present (10/23/2023)   [employer] of Occupational Health - Occupational Stress Questionnaire    Feeling of Stress: Not at all  Social Connections: Moderately Isolated (10/23/2023)   Social Connection and Isolation Panel    Frequency of Communication with Friends and Family: More than three times a week    Frequency of  Social Gatherings with Friends and Family: Once a week    Attends Religious Services: More than 4 times per year    Active Member of [employer] or Organizations: No    Attends Banker Meetings: Never    Marital Status: Widowed  Intimate Partner Violence: Not At Risk (10/23/2023)   Epic    Fear of Current or Ex-Partner: No    Emotionally Abused: No    Physically Abused: No    Sexually Abused: No  Depression (PHQ2-9): Medium Risk (11/05/2023)   Depression (PHQ2-9)    PHQ-2 Score: 6  Alcohol  Screen: Low Risk (10/23/2023)   Alcohol  Screen    Last Alcohol  Screening Score (AUDIT): 1  Housing: Low Risk (10/23/2023)   Epic    Unable to Pay for Housing in the Last Year: No    Number of Times Moved in the Last Year: 0    Homeless in the Last Year: No  Utilities: Not At Risk (10/23/2023)   Epic    Threatened with loss of utilities: No  Health Literacy: Adequate Health Literacy (10/23/2023)   B1300 Health Literacy    Frequency of need for help with medical instructions: Never    Outpatient Medications Prior to Visit  Medication Sig Dispense Refill   Accu-Chek Softclix Lancets lancets Check blood sugars 1-2 times daily 200 each 4   acetaminophen  (TYLENOL ) 325 MG tablet Take 2 tablets (650 mg total) by mouth every 6 (six) hours as needed for up to 30 doses for mild pain or moderate pain. 30 tablet 0   amLODipine  (NORVASC ) 5 MG tablet TAKE 1 TABLET BY MOUTH DAILY 100 tablet 2    aspirin  EC 81 MG tablet Take 81 mg by mouth daily. Swallow whole.     Blood Glucose Monitoring Suppl (ACCU-CHEK GUIDE ME) w/Device KIT CHECK BLOOD SUGAR 3 TIMES DAILY OR AS NEEDED 1 kit 0   calcium -vitamin D  (OSCAL WITH D) 500-5 MG-MCG tablet Take 1 tablet by mouth daily.     cholecalciferol  (VITAMIN D ) 1000 UNITS tablet Take 1,000 Units by mouth daily.     diclofenac  Sodium (VOLTAREN ) 1 % GEL Apply 4 g topically in the morning and at bedtime. As needed for right knee pain     dicyclomine  (BENTYL ) 10 MG capsule TAKE 1 CAPSULE BY MOUTH AS  NEEDED EVERY 12 HOURS 60 capsule 0   famotidine  (PEPCID ) 40 MG tablet TAKE 1 TABLET BY MOUTH AT  BEDTIME AS NEEDED FOR HEARTBURN  OR INDIGESTION 100 tablet 2   furosemide  (LASIX ) 20 MG tablet TAKE 1 TABLET BY MOUTH DAILY AS  NEEDED 100 tablet 2   glucose blood (ACCU-CHEK GUIDE TEST) test strip Check blood sugars 1-2 times daily 200 each 12   losartan  (COZAAR ) 50 MG tablet Take 1 tablet (50 mg total) by mouth 2 (two) times daily. 180 tablet 1   metFORMIN  (GLUCOPHAGE -XR) 500 MG 24 hr tablet Take 1 tablet (500 mg total) by mouth 2 (two) times daily with a meal. 180 tablet 1   metoCLOPramide  (REGLAN ) 5 MG tablet TAKE 1 TABLET BY MOUTH TWICE  DAILY AS NEEDED 180 tablet 2   metoprolol  tartrate (LOPRESSOR ) 50 MG tablet TAKE 1 TABLET BY MOUTH TWICE  DAILY 200 tablet 2   pantoprazole  (PROTONIX ) 40 MG tablet TAKE 1 TABLET BY MOUTH DAILY 100 tablet 2   potassium chloride  (KLOR-CON  M) 10 MEQ tablet Take 1 tablet (10 mEq total) by mouth daily. 90 tablet 1   rosuvastatin  (CRESTOR ) 20 MG tablet Take 1  tablet (20 mg total) by mouth daily. 90 tablet 1   thiamine  (VITAMIN B-1) 100 MG tablet Take 100 mg by mouth every other day. (Patient not taking: Reported on 01/09/2024)     vitamin E 180 MG (400 UNITS) capsule Take 400 Units by mouth daily.     No facility-administered medications prior to visit.    Allergies[1]  Review of Systems  Constitutional:  Negative  for fever and malaise/fatigue.  HENT:  Negative for congestion.   Eyes:  Negative for blurred vision.  Respiratory:  Negative for shortness of breath.   Cardiovascular:  Negative for chest pain, palpitations and leg swelling.  Gastrointestinal:  Negative for abdominal pain, blood in stool and nausea.  Genitourinary:  Negative for dysuria and frequency.  Musculoskeletal:  Negative for falls.  Skin:  Negative for rash.  Neurological:  Negative for dizziness, loss of consciousness and headaches.  Endo/Heme/Allergies:  Negative for environmental allergies.  Psychiatric/Behavioral:  Negative for depression. The patient is not nervous/anxious.        Objective:    Physical Exam Constitutional:      General: She is not in acute distress.    Appearance: Normal appearance. She is well-developed. She is not toxic-appearing.  HENT:     Head: Normocephalic and atraumatic.     Right Ear: External ear normal.     Left Ear: External ear normal.     Nose: Nose normal.  Eyes:     General:        Right eye: No discharge.        Left eye: No discharge.     Conjunctiva/sclera: Conjunctivae normal.  Neck:     Thyroid : No thyromegaly.  Cardiovascular:     Rate and Rhythm: Normal rate and regular rhythm.     Heart sounds: Normal heart sounds. No murmur heard. Pulmonary:     Effort: Pulmonary effort is normal. No respiratory distress.     Breath sounds: Normal breath sounds.  Abdominal:     General: Bowel sounds are normal.     Palpations: Abdomen is soft.     Tenderness: There is no abdominal tenderness. There is no guarding.  Musculoskeletal:        General: Normal range of motion.     Cervical back: Neck supple.  Lymphadenopathy:     Cervical: No cervical adenopathy.  Skin:    General: Skin is warm and dry.  Neurological:     Mental Status: She is alert and oriented to person, place, and time.  Psychiatric:        Mood and Affect: Mood normal.        Behavior: Behavior normal.         Thought Content: Thought content normal.        Judgment: Judgment normal.    LMP 02/07/1992  Wt Readings from Last 3 Encounters:  12/21/23 166 lb 0.6 oz (75.3 kg)  11/28/23 167 lb (75.8 kg)  11/27/23 167 lb (75.8 kg)    Diabetic Foot Exam - Simple   No data filed    Lab Results  Component Value Date   WBC 7.9 02/19/2024   HGB 12.4 02/19/2024   HCT 36.9 02/19/2024   PLT 259.0 02/19/2024   GLUCOSE 138 (H) 02/19/2024   CHOL 278 (H) 02/19/2024   TRIG 124.0 02/19/2024   HDL 77.50 02/19/2024   LDLDIRECT 95.0 03/25/2020   LDLCALC 175 (H) 02/19/2024   ALT 15 02/19/2024   AST 15 02/19/2024   NA 133 (  L) 02/19/2024   K 4.2 02/19/2024   CL 98 02/19/2024   CREATININE 0.75 02/19/2024   BUN 21 02/19/2024   CO2 24 02/19/2024   TSH 2.46 02/19/2024   INR 0.88 01/16/2011   HGBA1C 6.3 02/19/2024   MICROALBUR 7.4 (H) 02/19/2024    Lab Results  Component Value Date   TSH 2.46 02/19/2024   Lab Results  Component Value Date   WBC 7.9 02/19/2024   HGB 12.4 02/19/2024   HCT 36.9 02/19/2024   MCV 84.6 02/19/2024   PLT 259.0 02/19/2024   Lab Results  Component Value Date   NA 133 (L) 02/19/2024   K 4.2 02/19/2024   CHLORIDE 101 07/08/2015   CO2 24 02/19/2024   GLUCOSE 138 (H) 02/19/2024   BUN 21 02/19/2024   CREATININE 0.75 02/19/2024   BILITOT 0.6 02/19/2024   ALKPHOS 69 02/19/2024   AST 15 02/19/2024   ALT 15 02/19/2024   PROT 7.2 02/19/2024   ALBUMIN 4.6 02/19/2024   CALCIUM  9.7 02/19/2024   ANIONGAP 12 12/21/2023   EGFR 72 07/19/2023   GFR 73.90 02/19/2024   Lab Results  Component Value Date   CHOL 278 (H) 02/19/2024   Lab Results  Component Value Date   HDL 77.50 02/19/2024   Lab Results  Component Value Date   LDLCALC 175 (H) 02/19/2024   Lab Results  Component Value Date   TRIG 124.0 02/19/2024   Lab Results  Component Value Date   CHOLHDL 4 02/19/2024   Lab Results  Component Value Date   HGBA1C 6.3 02/19/2024       Assessment & Plan:   Vitamin D  deficiency Assessment & Plan: Supplement and monitor    Type 2 diabetes mellitus in patient with obesity (HCC) Assessment & Plan: hgba1c acceptable, minimize simple carbs. Increase exercise as tolerated. Continue current meds   Thiamine  deficiency Assessment & Plan: Supplement and monitor    Renal insufficiency Assessment & Plan: Hydrate and monitor    Preventative health care Assessment & Plan: Patient encouraged to maintain heart healthy diet, regular exercise, adequate sleep. Consider daily probiotics. Take medications as prescribed. Labs ordered and reviewed. Given and reviewed copy of ACP documents from [facility] Secretary of State and encouraged to complete and return she has aged out of colonoscopy, MGM and Pap   HYPERCHOLESTEROLEMIA Assessment & Plan: Encourage heart healthy diet such as MIND or DASH diet, increase exercise, avoid trans fats, simple carbohydrates and processed foods, consider a krill or fish or flaxseed oil cap daily. Tolerating Rosuvastatin    Essential hypertension Assessment & Plan: Well controlled, no changes to meds. Encouraged heart healthy diet such as the DASH diet and exercise as tolerated.     Personal history of benign carcinoid tumor Assessment & Plan: No malignant symptoms when it occurred back in 2012. Likely benign carcinoid tumor     Assessment and Plan Assessment & Plan      Harlene Horton, MD     [1] Allergies Allergen Reactions   Tramadol  Other (See Comments)    Dizziness   "

## 2024-02-23 NOTE — Assessment & Plan Note (Signed)
 Supplement and monitor

## 2024-02-25 ENCOUNTER — Ambulatory Visit: Admitting: Family Medicine

## 2024-02-25 ENCOUNTER — Encounter: Payer: Self-pay | Admitting: Family Medicine

## 2024-02-25 VITALS — BP 118/70 | HR 63 | Temp 98.2°F | Resp 16 | Ht 61.0 in | Wt 162.8 lb

## 2024-02-25 DIAGNOSIS — E1129 Type 2 diabetes mellitus with other diabetic kidney complication: Secondary | ICD-10-CM | POA: Diagnosis not present

## 2024-02-25 DIAGNOSIS — Z86012 Personal history of benign carcinoid tumor: Secondary | ICD-10-CM | POA: Diagnosis not present

## 2024-02-25 DIAGNOSIS — E559 Vitamin D deficiency, unspecified: Secondary | ICD-10-CM

## 2024-02-25 DIAGNOSIS — E1142 Type 2 diabetes mellitus with diabetic polyneuropathy: Secondary | ICD-10-CM

## 2024-02-25 DIAGNOSIS — E519 Thiamine deficiency, unspecified: Secondary | ICD-10-CM | POA: Diagnosis not present

## 2024-02-25 DIAGNOSIS — E669 Obesity, unspecified: Secondary | ICD-10-CM

## 2024-02-25 DIAGNOSIS — N289 Disorder of kidney and ureter, unspecified: Secondary | ICD-10-CM

## 2024-02-25 DIAGNOSIS — R809 Proteinuria, unspecified: Secondary | ICD-10-CM

## 2024-02-25 DIAGNOSIS — E119 Type 2 diabetes mellitus without complications: Secondary | ICD-10-CM

## 2024-02-25 DIAGNOSIS — Z853 Personal history of malignant neoplasm of breast: Secondary | ICD-10-CM

## 2024-02-25 DIAGNOSIS — Z0000 Encounter for general adult medical examination without abnormal findings: Secondary | ICD-10-CM

## 2024-02-25 DIAGNOSIS — D538 Other specified nutritional anemias: Secondary | ICD-10-CM | POA: Diagnosis not present

## 2024-02-25 DIAGNOSIS — I10 Essential (primary) hypertension: Secondary | ICD-10-CM

## 2024-02-25 DIAGNOSIS — E7800 Pure hypercholesterolemia, unspecified: Secondary | ICD-10-CM | POA: Diagnosis not present

## 2024-02-25 NOTE — Assessment & Plan Note (Signed)
 Patient is already on max dose of Losartan  but is hesitant to add Jardiance. She has had some episodes of feeling shakey and jittery even when her sugar is 80. She is encouraged to improve hydration and minimize over the counter supplements. Will monitor closely

## 2024-02-25 NOTE — Patient Instructions (Signed)
 Preventive Care 83 Years and Older, Female Preventive care refers to lifestyle choices and visits with your health care provider that can promote health and wellness. Preventive care visits are also called wellness exams. What can I expect for my preventive care visit? Counseling Your health care provider may ask you questions about your: Medical history, including: Past medical problems. Family medical history. Pregnancy and menstrual history. History of falls. Current health, including: Memory and ability to understand (cognition). Emotional well-being. Home life and relationship well-being. Sexual activity and sexual health. Lifestyle, including: Alcohol, nicotine or tobacco, and drug use. Access to firearms. Diet, exercise, and sleep habits. Work and work Astronomer. Sunscreen use. Safety issues such as seatbelt and bike helmet use. Physical exam Your health care provider will check your: Height and weight. These may be used to calculate your BMI (body mass index). BMI is a measurement that tells if you are at a healthy weight. Waist circumference. This measures the distance around your waistline. This measurement also tells if you are at a healthy weight and may help predict your risk of certain diseases, such as type 2 diabetes and high blood pressure. Heart rate and blood pressure. Body temperature. Skin for abnormal spots. What immunizations do I need?  Vaccines are usually given at various ages, according to a schedule. Your health care provider will recommend vaccines for you based on your age, medical history, and lifestyle or other factors, such as travel or where you work. What tests do I need? Screening Your health care provider may recommend screening tests for certain conditions. This may include: Lipid and cholesterol levels. Hepatitis C test. Hepatitis B test. HIV (human immunodeficiency virus) test. STI (sexually transmitted infection) testing, if you are at  risk. Lung cancer screening. Colorectal cancer screening. Diabetes screening. This is done by checking your blood sugar (glucose) after you have not eaten for a while (fasting). Mammogram. Talk with your health care provider about how often you should have regular mammograms. BRCA-related cancer screening. This may be done if you have a family history of breast, ovarian, tubal, or peritoneal cancers. Bone density scan. This is done to screen for osteoporosis. Talk with your health care provider about your test results, treatment options, and if necessary, the need for more tests. Follow these instructions at home: Eating and drinking  Eat a diet that includes fresh fruits and vegetables, whole grains, lean protein, and low-fat dairy products. Limit your intake of foods with high amounts of sugar, saturated fats, and salt. Take vitamin and mineral supplements as recommended by your health care provider. Do not drink alcohol if your health care provider tells you not to drink. If you drink alcohol: Limit how much you have to 0-1 drink a day. Know how much alcohol is in your drink. In the [place] one drink equals one 12 oz bottle of beer (355 mL), one 5 oz glass of wine (148 mL), or one 1 oz glass of hard liquor (44 mL). Lifestyle Brush your teeth every morning and night with fluoride toothpaste. Floss one time each day. Exercise for at least 30 minutes 5 or more days each week. Do not use any products that contain nicotine or tobacco. These products include cigarettes, chewing tobacco, and vaping devices, such as e-cigarettes. If you need help quitting, ask your health care provider. Do not use drugs. If you are sexually active, practice safe sex. Use a condom or other form of protection in order to prevent STIs. Take aspirin only as told by  your health care provider. Make sure that you understand how much to take and what form to take. Work with your health care provider to find out whether it  is safe and beneficial for you to take aspirin daily. Ask your health care provider if you need to take a cholesterol-lowering medicine (statin). Find healthy ways to manage stress, such as: Meditation, yoga, or listening to music. Journaling. Talking to a trusted person. Spending time with friends and family. Minimize exposure to UV radiation to reduce your risk of skin cancer. Safety Always wear your seat belt while driving or riding in a vehicle. Do not drive: If you have been drinking alcohol. Do not ride with someone who has been drinking. When you are tired or distracted. While texting. If you have been using any mind-altering substances or drugs. Wear a helmet and other protective equipment during sports activities. If you have firearms in your house, make sure you follow all gun safety procedures. What's next? Visit your health care provider once a year for an annual wellness visit. Ask your health care provider how often you should have your eyes and teeth checked. Stay up to date on all vaccines. This information is not intended to replace advice given to you by your health care provider. Make sure you discuss any questions you have with your health care provider. Document Revised: 07/21/2020 Document Reviewed: 07/21/2020 Elsevier Patient Education  2024 [employer].

## 2024-02-26 ENCOUNTER — Encounter: Payer: Self-pay | Admitting: Physical Therapy

## 2024-02-26 ENCOUNTER — Telehealth: Payer: Self-pay | Admitting: Family Medicine

## 2024-02-26 ENCOUNTER — Ambulatory Visit: Admitting: Physical Therapy

## 2024-02-26 DIAGNOSIS — M6281 Muscle weakness (generalized): Secondary | ICD-10-CM

## 2024-02-26 DIAGNOSIS — M25661 Stiffness of right knee, not elsewhere classified: Secondary | ICD-10-CM

## 2024-02-26 DIAGNOSIS — M25561 Pain in right knee: Secondary | ICD-10-CM

## 2024-02-26 DIAGNOSIS — R2681 Unsteadiness on feet: Secondary | ICD-10-CM

## 2024-02-26 DIAGNOSIS — R2689 Other abnormalities of gait and mobility: Secondary | ICD-10-CM

## 2024-02-26 DIAGNOSIS — I10 Essential (primary) hypertension: Secondary | ICD-10-CM

## 2024-02-26 NOTE — Telephone Encounter (Signed)
 Pt came in stating she forgot to ask dr.blyth about her potassium. Pt wants to kow if she needs to start taking it again or if she is okay. Please call pt and let her know.

## 2024-02-26 NOTE — Therapy (Signed)
 " OUTPATIENT PHYSICAL THERAPY TREATMENT    Patient Name: [name] MRN: [id number] DOB:07-09-1940, 84 y.o., female Today's Date: 02/26/2024   END OF SESSION:  PT End of Session - 02/26/24 1318     Visit Number 11    Date for Recertification  04/02/24    Authorization Type [employer]    Authorization Time Period 01/09/24-04/02/24    Authorization - Visit Number 11    Authorization - Number of Visits 24    Progress Note Due on Visit 20    PT Start Time 1318    PT Stop Time 1400    PT Time Calculation (min) 42 min    Activity Tolerance Patient tolerated treatment well    Behavior During Therapy [facility] for tasks assessed/performed            Past Medical History:  Diagnosis Date   Abdominal pain in female 03/18/2010   Qualifier: Diagnosis of  By: Avram MD, NOLIA Pitts E    Anemia 06/08/2014   Anxiety    Arthritis    Spinal Osteoarthritis   Breast cancer (HCC)    Carcinoid tumor of stomach (HCC)    Cataract    Chest pain    Myoview  12/15 no ischemia.   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 09/05/2006   Qualifier: Diagnosis of  By: Wilhemina RMA, Lucy     Diabetic peripheral neuropathy (HCC) 10/29/2013   Encounter for preventative adult health care exam with abnormal findings 09/14/2013   Esophageal reflux    Gastric polyp    Fundic Gland   Gastroparesis    Headache(784.0)    Heart murmur    Echocardiogram 2/11: EF 60-65%, mild LAE, grade 1 diastolic dysfunction, aortic valve sclerosis, mean gradient 9 mm of mercury, PASP 34   Hematuria 03/16/2016   Iron  deficiency anemia, unspecified    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   Leg swelling    bilateral   Neck pain 04/22/2015   PONV (postoperative nausea and vomiting)    pt states body temperature drops every time she has anesthesia; pt states only needs small amount of anesthesia   PSVT (paroxysmal supraventricular  tachycardia)    Pure hypercholesterolemia    Recurrent UTI 01/11/2016   Renal insufficiency 06/18/2019   pt states  L kidney function very low-functions at about 20%   Stroke [facility])    tia, 2014   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   Type II or unspecified type diabetes mellitus without mention of complication, not stated as uncontrolled    Unspecified essential hypertension    Unspecified hereditary and idiopathic peripheral neuropathy 10/29/2013   Vitamin D  deficiency    Past Surgical History:  Procedure Laterality Date   BREAST BIOPSY Right 06/13/2022   US  RT BREAST BX W LOC DEV 1ST LESION IMG BX SPEC US  GUIDE 06/13/2022 [facility]AMMOGRAP[facility]   BREAST LUMPECTOMY Right 07/31/2022   Procedure: RIGHT BREAST LUMPECTOMY;  Surgeon: Vernetta Berg, MD;  Location: [facility];  Service: General;  Laterality: Right;   CHOLECYSTECTOMY  1993   COLONOSCOPY  11/11/2010   diverticulosis   DILATATION & CURRETTAGE/HYSTEROSCOPY WITH RESECTOCOPE N/A 02/25/2013   Procedure: Attempted hysteroscopy with uterine perforation;  Surgeon: Bobie FORBES Crown de Charlynn FORBES Cary, MD;  Location: [facility];  Service: Gynecology;  Laterality: N/A;   ESOPHAGOGASTRODUODENOSCOPY  08/29/2010; 09/15/2010   Carcinoid tumor less than 1 cm in July 2012 not  seen in August 2012 , gastritis, fundic gland polyps   ESOPHAGOGASTRODUODENOSCOPY  05/16/2011   ESOPHAGOGASTRODUODENOSCOPY  06/14/2012   EUS  12/15/2010   Procedure: UPPER ENDOSCOPIC ULTRASOUND (EUS) LINEAR;  Surgeon: Toribio Cedar, MD;  Location: [facility];  Service: Endoscopy;  Laterality: N/A;   EYE SURGERY Bilateral    Bi lateral cateracts and bi lateral laser   LAPAROSCOPY N/A 02/25/2013   Procedure: Cystoscopy and laparoscopy with fulguration of uterine serosa;  Surgeon: Bobie FORBES Crown de Charlynn FORBES Cary, MD;  Location: [facility];  Service: Gynecology;  Laterality: N/A;   TONSILLECTOMY     Patient Active Problem List   Diagnosis Date Noted   Microalbuminuria due to type 2  diabetes mellitus (HCC) 02/25/2024   Acute pain of right knee 11/20/2023   Precordial chest pain 12/24/2022   History of breast cancer 07/04/2022   SVT (supraventricular tachycardia) 06/21/2022   Left-sided weakness 04/02/2022   Unsteady gait 04/02/2022   Oral lesion 03/08/2021   Sun-damaged skin 03/08/2021   Thiamine  deficiency 11/01/2020   Trigeminal neuralgia 08/21/2020   Pedal edema 08/21/2020   Chronic left-sided back pain 03/28/2020   Nocturia 03/28/2020   Left-sided headache 03/28/2020   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   Renal insufficiency 06/18/2019   Educated about COVID-19 virus infection 05/15/2019   Atrophic vaginitis 01/20/2019   Stenosis of carotid artery 11/12/2018   Anxiety 10/13/2018   Referred otalgia of left ear 04/23/2018   Chronic throat clearing 04/23/2018   Sinusitis 10/11/2017   Headache 07/12/2017   Dizzy spells 11/21/2016   Constipation 11/21/2016   Nonrheumatic aortic valve stenosis 05/23/2016   Aortic atherosclerosis 05/23/2016   Hematuria 03/16/2016   Recurrent UTI 01/11/2016   Pain of upper abdomen 06/06/2015   Neck pain 04/22/2015   Ear pain 02/28/2015   Abnormal urine 11/21/2014   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   Anemia 06/08/2014   RLS (restless legs syndrome) 11/23/2013   Diabetic peripheral neuropathy (HCC) 10/29/2013   Left-sided thoracic back pain 10/06/2013   Encounter for preventative adult health care exam with abnormal findings 09/14/2013   Iron  deficiency anemia    Status post laparoscopy 02/25/2013   Hyponatremia 01/09/2013   GERD (gastroesophageal reflux disease) 01/09/2013   Amaurosis fugax of left eye 10/16/2012   Low back pain 06/03/2012   Vitamin D  deficiency 03/11/2012   Bilateral hand pain 10/20/2011   Encounter for long-term (current) use of other medications 10/20/2011   IBS (irritable bowel syndrome) 08/14/2011   TIA (transient ischemic attack) 02/10/2011   Abnormal brain CT  01/19/2011   Allergic rhinitis 10/01/2010   Personal history of benign carcinoid tumor 09/29/2010   Preventative health care 07/15/2010   FUNDIC GLAND POLYPS OF STOMACH 03/18/2010   Abdominal pain in female 03/18/2010   Left hip pain 03/17/2009   SYSTOLIC MURMUR 03/02/2009   Paroxysmal supraventricular tachycardia 01/12/2009   PLANTAR FASCIITIS 06/08/2008   CHEST PAIN 05/18/2008   Gastroparesis 12/18/2007   HYPERCHOLESTEROLEMIA 06/11/2007   Type 2 diabetes mellitus in patient with obesity (HCC) 09/05/2006   Essential hypertension 09/05/2006    PCP: Domenica Harlene LABOR, MD   REFERRING PROVIDER: Charles Redell LABOR, DO  REFERRING DIAG: M17.11 (ICD-10-CM) - Primary osteoarthritis of right knee   THERAPY DIAG:  Acute pain of right knee  Stiffness of right knee, not elsewhere classified  Muscle weakness (generalized)  Other abnormalities of gait and mobility  Unsteadiness on feet  RATIONALE FOR EVALUATION AND TREATMENT: Rehabilitation  ONSET DATE: early  October  NEXT MD VISIT: TBD   SUBJECTIVE:                                                                                                                                                                                                         SUBJECTIVE STATEMENT: Patient reports pain continues to improve but still having some R anterior knee pain.   EVAL: Pt reports on Sunday in early October she was out for dinner with her family and the next day her knee was swollen and she could barely put weight on it.  She waited about 2 weeks before going to see the doctor.  MD drew some fluid off and gave her a cortisone shot which helped for ~1 week but now the pain is back and she feels crippled. Pain prevents her from walking much or going out.  Difficulty putting on her clothes and shoes.  She also notes a burning feeling in her knee and anterior leg.  She has variable swelling in her knee - better today after taking lasix  yesterday.  She  states she has to use a cane at home but did not bring it with her to PT today.  She states now her R hip and back are also hurting due to limping for her knee.  Pain prevents her from sleeping, esp on her sides, which is her preferred sleeping position.  PAIN: Are you having pain? Yes: NPRS scale: up to 4/10  Pain location: R anterior knee, lower thigh and upper lower leg  Pain description: sharp  Aggravating factors: walking, prolonged standing, lower body dressing  Relieving factors: Voltaren  gel, Tylenol , ice - nothing give much relief  PERTINENT HISTORY:  Extensive PMH including chronic back and neck pain, OA, osteoporosis, DM-II, HTN, h/o TIA, diabetic peripheral neuropathy, PVD, headaches, sleep dysfunction, anxiety, R breast cancer s/p lumpectomy 07/31/22, SVT (refer to above problem list and past medical/surgical history for full PMH)   PRECAUTIONS: Fall  RED FLAGS: None  WEIGHT BEARING RESTRICTIONS: No  FALLS:  Has patient fallen in last 6 months? No  LIVING ENVIRONMENT: Lives with: lives alone Lives in: House Stairs: No Has following equipment at home: Single point cane, shower chair, and Grab bars  OCCUPATION: Retired  PLOF: Independent, Needs assistance with homemaking, and Leisure: read and play card games on her phone, traveling, and housekeeping/cleaning service ~1x/month   PATIENT GOALS: Just to walk freely with no pain. Be able to climb stairs.   OBJECTIVE: (objective measures completed at initial evaluation unless otherwise dated)  DIAGNOSTIC FINDINGS:  11/27/23 -  DG RIGHT KNEE - COMPLETE 4+ VIEW CLINICAL DATA:  Right knee pain. Primary osteoarthritis of right knee. Acute knee pain.   FINDINGS: The alignment and joint spaces are preserved. Mild tricompartmental peripheral spurring and spurring of the tibial spines. Small knee joint effusion. No fracture, erosion, or focal bone abnormality. Frontal view of the left knee included for comparison demonstrates  tibiofemoral osteoarthritis.   IMPRESSION: Mild tricompartmental osteoarthritis of the right knee. Small knee joint effusion.  PATIENT SURVEYS:  LEFS  Extreme difficulty/unable (0), Quite a bit of difficulty (1), Moderate difficulty (2), Little difficulty (3), No difficulty (4) Survey date:  01/09/24  02/04/24  Any of your usual work, housework or school activities 0 1  2. Usual hobbies, recreational or sporting activities 0 0  3. Getting into/out of the bath 1 1  4. Walking between rooms 1 3  5. Putting on socks/shoes 0 0  6. Squatting  0 0  7. Lifting an object, like a bag of groceries from the floor 0 1  8. Performing light activities around your home 0 1  9. Performing heavy activities around your home 0 0  10. Getting into/out of a car 0 0  11. Walking 2 blocks 0 0  12. Walking 1 mile 0 0  13. Going up/down 10 stairs (1 flight) 0 0  14. Standing for 1 hour 0 1  15.  sitting for 1 hour 0 4  16. Running on even ground 0 0  17. Running on uneven ground 0 0  18. Making sharp turns while running fast 0 0  19. Hopping  0 0  20. Rolling over in bed 0 1  Score total:  2/80 13/80  Functional limitation: Severe Severe     COGNITION: Overall cognitive status: Within functional limits for tasks assessed    SENSATION: WFL Pt reports burning and itching in her knee at times  EDEMA:  Circumferential:   Mid patella: R = 46.0 cm L = 43.5 cm  6 cm above: R = 49.0 cm L = 45.0 cm  6 cm below: R = 43.0 cm L = 40.0 cm  POSTURE:  rounded shoulders, forward head, and weight shift left  PALPATION: Very TTP over patella and anterior knee - increased edema apparent with decreased patellar mobility and poor quad activation (especially VMO) with quad set  MUSCLE LENGTH: TBA next visit Hamstrings:  ITB:  Piriformis:  Hip flexors:  Quads:  Heelcord:   LOWER EXTREMITY ROM:  Active ROM Right eval Left eval R 02/04/24 RLE R   02/19/24  Knee flexion 82 120 104 105 107  Knee  extension 18 LAQ /  8 supported 0 11 LAQ /  2 supported -2 to 3 degrees in supine 8 LAQ /  2 supported  (Blank rows = not tested)  LOWER EXTREMITY MMT: (Tested in sitting on eval)  MMT Right eval Left eval R 02/19/24 L 02/19/24  Hip flexion 3 3+ 3+ 4  Hip extension 2 3- 2+ 3-  Hip abduction 3+ 4- 4- 4  Hip adduction 3+ 4- 4- 4  Hip internal rotation   4 4+  Hip external rotation   3+ 4-  Knee flexion 3+ 4- 4- 4  Knee extension 3- 4 4- 4  Ankle dorsiflexion 3+ 4- 4- 4  Ankle plantarflexion      Ankle inversion      Ankle eversion       (Blank rows = not tested)  FUNCTIONAL TESTS: 01/16/24 5 times  sit to stand: 47.84 sec  Timed up and go (TUG): 44.75 sec with SPC  10 meter walk test: 54.47 sec with SPC  Gait speed = 0.60 ft/sec with SPC  GAIT: Distance walked: 50' Assistive device utilized: Single point cane and None Level of assistance: SBA Gait pattern: step to pattern, decreased step length- Left, decreased stride length, decreased hip/knee flexion- Right, antalgic, and lateral lean- Left Comments: Increased instability evident with patient reporting she uses SPC at home but did not bring it with her to PT   TODAY'S TREATMENT:   02/26/2024  THERAPEUTIC EXERCISE: To improve strength, endurance, ROM, and flexibility.  Demonstration, verbal and tactile cues throughout for technique.  Rec bike - full revolutions to L1 x 6' Seated hip adductor stretch 2 x 30 B Seated RTB hip ABD/ER clam 2 x 10  NEUROMUSCULAR RE-EDUCATION: To improve strength, coordination, kinesthesia, posture, proprioception, and balance. Standing hip ABD with looped RTB at distal thighs x 10 B Standing hip ext with looped RTB at distal thighsx 10 B Standing hip ABD/ext diagonal with looped RTB at distal thighs x 10 B - pt reports slight increased pain in R medial thigh Standing hip flexion march + hip ER with looped RTB at distal thighs x 8 B Standing hip extension + hip ER with looped RTB at distal  thighs x 10 B Seated Fitter (1 black/1 blue) leg press 2 x 10 B Seated RTB leg press 2 x 10 B   02/19/24 MANUAL THERAPY: To promote normalized muscle tension, improved flexibility, and reduced pain utilizing connective tissue massage, therapeutic massage, manual TP therapy, and myofascial release.  STM/DTM and IASTM with edge tool and roller stick to R glutes, piriformis, TLF, distal hip flexors, proximal lateral quads, and medial HS  THERAPEUTIC EXERCISE: To improve strength and endurance.  Demonstration, verbal and tactile cues throughout for technique. Hooklying R SLR 2 x 5 S/L R clam x 10 S/L R hip ABD x 10 (slight PT assisted AAROM) Standing R hip ABD x 10 Standing R hip ABD/ext x 10 - pt reports increased pain in R medial knee Standing R hip ext x 10  THERAPEUTIC ACTIVITIES: To improve functional performance.  Demonstration, verbal and tactile cues throughout for technique.  5xSTS = 19.06 sec TUG = 31.22 sec : 19.22 sec w/o AD 21.88 sec with SPC on L Gait speed: 1.71 ft/sec w/o AD 1.50 ft/sec with SPC  SELF CARE:  Review of progress with PT, status of goals, and plans for ongoing PT POC requesting patient input and feedback to ensure PT is addressing all of her concerns.    02/14/24 NuStep L4 6 minutes (UE/LE)  THERAPEUTIC EXERCISE  All the following exercises were performed to fatigue: Standing alt hip abduct, extension Seated resisted alt abduction red band  Seated Marches with red band  Seated alt resisted ankle pumps with blue band Seated alt hip flexion with knee extended Glute bridges with bolster under calf  Alt step ups on 6in block against the counter with light guarding hands free  THERAPEUTIC EXERCISE: To improve strength, and increased endurance to work towards pt goal of walking up 6 steps at family visits. Also ankle pumps with band resistance to increase blood flow to help reduce swelling lower legs  PT EDUCATION: Verbally and physically guided pt  to step with L foot first when ascending stairs when she goes to visit family. Advised pt to continue HEP to help with swelling and pain in knee.    02/12/24:  somewhat abbreviated session as pt  7 min late: Supine , Knees over bolster, deep tissue massage R distal lateral quads , Itband Nustep L 4, x 5 min  Standing with 2 # cuff wts ankles for alt hip abd, alt hip ext ,alt hamstring curls, 2 x 10 each Seated marches alt with red t band 2 x 10 Seated SLR 2 x 10 each leg    02/06/24 THERAPEUTIC EXERCISE: To improve strength, endurance, and ROM.  Demonstration, verbal and tactile cues throughout for technique.  NuStep - L3 x 6' (UE/LE)  Standing hip abd 2# x 15 RLE Standing hip ext x2# x 15 RLE Standing knee flexion 2# x 15 RLE Seated knee flexion GTB x 2/10 RLE Seated hip abd/ER GTB x 20 BLE Supine bent knee fallouts GTB x 20 BLE Supine SLR ham stretch w/ PT assist x 1' x 3 RLE Supine cross over piriformis stretch x 1' x 3 RLE SL clam x 2/10 RLE Circumference measures at mid patella:  L knee = 42.5 cm;   R knee = 44 cm  NEUROMUSCULAR RE-EDUCATION: To improve strength and balance. Sidestepping GTB  to knees x 2 laps at counter  MANUAL THERAPY: To promote reduced pain utilizing myofascial release. MFR to R lateral hamstrings in SLR stretch position; MFR to R distal IT in sidelying via knuckle massage   02/04/2024  THERAPEUTIC EXERCISE: To improve strength, endurance, and ROM.  Demonstration, verbal and tactile cues throughout for technique.  NuStep - L2 x 6' (UE/LE)  SELF CARE:   Provided instruction in application of Galvaran Knee Brace ([email or web address]) which patient's sister recommended to her and she brought with her to the therapy session.  Brace seemed a little snug and in the short trial of wearing it during therapy session seem to create some friction irritation along the edges of the brace and the seems with patient not really noting any difference in knee pain while  wearing the brace, therefore recommended that she would probably be better off not using the brace.  MANUAL THERAPY: To promote normalized muscle tension, improved flexibility, improved joint mobility, increased ROM, and reduced pain utilizing connective tissue massage, therapeutic massage, manual TP therapy, and percussion massage with massage gun.  STM/DTM and percussion massage with massage gun to R glutes, piriformis, ITB, lateral quads and hamstrings  THERAPEUTIC ACTIVITIES: To improve functional performance.  Demonstration, verbal and tactile cues throughout for technique. LEFS: 13 / 80 = 16.3 % R knee ROM assessment - see ROM table above   01/29/24: Supine for AP jt mobs L prox tibia with thigh over bolster, 2 bouts 45 sec each, gentle, to improve R knee flexion motion  Mob with movement, lat glides R hip while assisting with R hip IR/ER and flex Side lying L with R thigh over 6 roll, for theragun massage R lateral hip musculature Supine with bolster under knees , manually resisted R hip IR, and then manually resisted R hip IR , 15 reps each  Ankle pumps with theraband for R ankle plantarflexion motion.   01/22/24 Nustep L1x73min UE/LE Seated R LE PF GTB X 10 Seated hip ABD X 10 B Seated ball squeeze x 10  Seated ball squeeze + LAQ x 10 Seated R hamstring stretch 2x30 sec Seated R adductor stretch 2x30 sec Seated R foot on 9' stool piriformis stretch 2x30 sec   01/18/24 THERAPEUTIC EXERCISE: To improve strength, endurance, ROM, and flexibility.  Demonstration, verbal and tactile cues throughout for technique.  NuStep -  L3 x 6' (UE/LE) Seated hip ADD isometric ball squeeze 10 x 5 Seated R hip adductor stretch 2 x 30 Seated hip ADD ball squeeze + R LAQ x 10 Seated hip hinge R HS stretch 2 x 30 Seated R knee flexion/extension heel slides on slider 2 x 10 Supine manual L hip adductor stretch 2 x 30 Supine manual L HS stretch 2 x 30 Supine manual L ITB stretch 2 x  30 Supine R quad set into towel roll 10 x 5  MANUAL THERAPY: To promote normalized muscle tension, improved flexibility, improved joint mobility, increased ROM, reduced pain, and reduced edema utilizing joint mobilization, connective tissue massage, therapeutic massage, manual TP therapy, and kinesiotaping. Kinesiotaping to R knee (blue Kinesiotex Classic) - chondromalacia pattern with slight increased stretch on lateral strip to promote medial patellar glide R patellar mobilization all directions - very limited mobility with some increased pain reported at points of PT pressure STM/DTM and manual TPR to R hip adductors, quads and ITB   01/16/24  THERAPEUTIC ACTIVITIES: To improve functional performance.  Demonstration, verbal and tactile cues throughout for technique. 5xSTS = 47.84 sec TUG = 44.75 sec with SPC = 54.47 sec with SPC Gait speed = 0.60 ft/sec with SPC  GAIT TRAINING: To normalize gait pattern and improve safety with SPC. 25' with SPC and SBA of PT - repeated verbal and visual cues/demonstration for step through pattern and coordination of SPC with R LE, however patient continues to demonstrate step to pattern  THERAPEUTIC EXERCISE: To improve strength, ROM, and flexibility.  Demonstration, verbal and tactile cues throughout for technique. Supine quad set on towel roll 10 x 5 Supine hip ABD/ADD x 10 Manual HS stretch by PT 2 x 30 Mod thomas quad/hip flexor stretch 2 x 30-60 Attempted R hip ADD butterfly stretch but deferred d/t increased pain  B HS curl/AAROM R knee flexion with heels on peanut ball and strap assist for R LE 2 x 10  MANUAL THERAPY: To promote normalized muscle tension, improved flexibility, improved joint mobility, increased ROM, and reduced pain utilizing joint mobilization, connective tissue massage, therapeutic massage, and kinesiotaping.  STM and gentle IASTM with foam roller to R quads, hip adductors and ITB R patellar mobilization all  directions - very limited mobility however able to reduce pain with heel slides when PT providing medial patellar glide Kinesiotaping to R knee (sensitive skin tape - light blue Kinesiotex Gold) - chondromalacia pattern with slight increased stretch on lateral strip to promote medial patellar glide  MODALITIES:  Ice pack to R knee x 10' in sitting with LE elevated on 9 stool to reduce post-exercise pain and edema   01/09/24 - Eval SELF CARE:   Reviewed eval findings and role of PT in addressing identified deficits as well as instruction in initial HEP (see below).  Provided instruction in gait training with SPC in L hand to offset R knee.  Patient only able to demonstrate step to pattern despite repeated verbal cues and demonstration of step through pattern.  Further training indicated next visit. Discussed taking her lasix  more frequently (prescribed for daily PRN) to help with LE edema as she notes slight improvement in LE edema and knee pain after she takes the lasix .  Also explained that her potassium supplement should be taken with the lasix  to offset the loss of potassium as a result of the lasix .    PATIENT EDUCATION:  Education details: HEP update - RTB seated leg press  Person educated: Patient  Education method: Explanation, Demonstration, Verbal cues, Tactile cues, and Handouts Education comprehension: verbalized understanding, returned demonstration, verbal cues required, tactile cues required, and needs further education  HOME EXERCISE PROGRAM: Access Code: [email or web address] URL: [email or web address] Date: 02/26/2024 Prepared by: Elijah Hidden  Exercises - Supine Quad Set on Towel Roll  - 2-3 x daily - 7 x weekly - 2 sets - 10 reps - 5 sec hold - Seated Heel Slide  - 2-3 x daily - 7 x weekly - 2 sets - 10 reps - 3 sec hold - Seated Hip Adduction Isometrics with Ball  - 2 x daily - 7 x weekly - 2 sets - 10 reps - 3-5 sec hold - Seated Long Arc Quad with Hip Adduction  -  2 x daily - 7 x weekly - 2 sets - 10 reps - 3 sec hold - Seated Hip Adductor Stretch  - 2 x daily - 7 x weekly - 3 reps - 30 sec hold - Seated Hamstring Stretch  - 2 x daily - 7 x weekly - 3 reps - 30 sec hold - Clamshell  - 1 x daily - 7 x weekly - 1 sets - 10 reps - Seated Hip Abduction with Resistance  - 1 x daily - 7 x weekly - 2 sets - 10 reps - Supine Piriformis Stretch  - 1 x daily - 7 x weekly - 1 sets - 2 reps - 1 min hold - Seated Hamstring Curl with Anchored Resistance  - 1 x daily - 7 x weekly - 2 sets - 10 reps - Seated Leg Press with Resistance  - 1 x daily - 7 x weekly - 2 sets - 10 reps - 3 sec hold   ASSESSMENT:  CLINICAL IMPRESSION: [name] reports her R knee pain remains less intense and she continues to express desire to stop using her SPC, stating that she walks around her home w/o it.  Continued to recommend/encouraged her to use cane when out in community to help avoid further irritation of R knee and reduce fall risk.  Progressed LE strengthening with increased weightbearing and resisted activities.  R medial thigh pain noted with abduction and abduction/extension motions therefore introduced hip adductor stretching with some relief noted.  Seated RTB leg press added to HEP to continue to promote increased quad and hip extensor strengthening.  [name] will benefit from continued skilled PT to address ongoing abnormal muscle tension, flexibility, strength, and balance deficits to improve mobility and activity tolerance with decreased pain interference and decreased risk for falls.   EVAL: [name] is a 84 y.o. female who was referred to physical therapy for evaluation and treatment for acute R knee pain secondary to osteoarthritis.  Patient reports onset of R knee pain beginning in early October without known injury or precipitating event, other than having gone out to dinner with her family the night before.  Pain is worse with any standing or walking, as well as  lower body dressing.  Patient has deficits in R knee ROM, B LE flexibility, B LE strength, abnormal posture, antalgic gait pattern with evidence of gait instability, and TTP with abnormal muscle tension which are interfering with ADLs and are impacting quality of life.  On LEFS patient scored 2/80 demonstrating severe functional limitation.  Further standardized balance testing to be completed next visit.  [name] will benefit from skilled PT to address above deficits to improve mobility and activity tolerance with decreased pain interference.  OBJECTIVE IMPAIRMENTS: Abnormal gait, decreased  activity tolerance, decreased balance, decreased endurance, decreased knowledge of condition, decreased knowledge of use of DME, decreased mobility, difficulty walking, decreased ROM, decreased strength, decreased safety awareness, increased edema, increased fascial restrictions, impaired perceived functional ability, increased muscle spasms, impaired flexibility, improper body mechanics, postural dysfunction, and pain.   ACTIVITY LIMITATIONS: carrying, lifting, bending, sitting, standing, squatting, sleeping, stairs, transfers, bed mobility, bathing, dressing, and locomotion level  PARTICIPATION LIMITATIONS: meal prep, cleaning, laundry, driving, shopping, community activity, and church  PERSONAL FACTORS: Age, Behavior pattern, Fitness, Past/current experiences, Time since onset of injury/illness/exacerbation, and 3+ comorbidities: Extensive PMH including chronic back and neck pain, OA, osteoporosis, DM-II, HTN, h/o TIA, diabetic peripheral neuropathy, PVD, headaches, sleep dysfunction, anxiety, R breast cancer s/p lumpectomy 07/31/22, SVT  are also affecting patient's functional outcome.   REHAB POTENTIAL: Good  CLINICAL DECISION MAKING: Evolving/moderate complexity  EVALUATION COMPLEXITY: Moderate   GOALS: Goals reviewed with patient? Yes  SHORT TERM GOALS: Target date: 02/20/2024  Patient will be  independent with initial HEP. Baseline: HEP initiated on eval 01/16/24 - Limited review today 01/18/24 - full HEP review with slight modification/update 02/04/24 - Pt denies concerns, but not formally reviewed Goal status: MET - 02/19/24  2.  Patient will report at least 25% improvement in R knee pain to improve QOL. Baseline: 10/10 Goal status: MET - 02/04/24 - 30-40% improvement in R knee pain  3.  Patient will demonstrate improved R knee AROM to >/= 10-95 to allow for normal gait and stair mechanics. Baseline: Refer to above LE ROM table - R knee AROM 18-82, with 8 extension lacking when knee supported  02/04/24: R knee AROM 11-104, with 2 lacking in supported extension Goal status: MET - 02/19/24 - R knee AROM 8-107, with 2 lacking in supported extension  4.  Complete standardized balance testing and update POC/goals as indicated. Baseline:  Goal status: MET - 01/16/24 - 5xSTS, TUG and completed   LONG TERM GOALS: Target date: 04/02/2024  Patient will be independent with advanced/ongoing HEP to improve outcomes and carryover.  Baseline: 02/06/24 advanced and provided written handouts on new exercises Goal status: IN PROGRESS - 02/26/24 - HEP updated  2.  Patient will report at least 50% improvement in R knee pain to improve QOL. Baseline: 10/10 02/06/24:  7/10 Goal status: IN PROGRESS - 02/19/24 - 60% improved  3.  Patient will demonstrate improved R knee AROM to >/= 5-110 to allow for normal gait and stair mechanics. Baseline: Refer to above LE ROM table - R knee AROM 18-82, with 8 extension lacking when knee supported  02/06/24:  see updated tables Goal status: IN PROGRESS - 02/19/24 - R knee AROM 8-107, with 2 lacking in supported extension  4.  Patient will demonstrate improved B LE strength to >/= 4 to 4+/5 for improved stability and ease of mobility. Baseline: Refer to above LE MMT table Goal status: IN PROGRESS - 02/19/24 - R LE remains weaker than L, esp  proximally  5.  Patient will be able to ambulate 600' with LRAD and normal gait pattern without increased pain to access community.  Baseline: Antalgic step to pattern with SPC on L, worsening antalgic gait pattern without AD Goal status: INITIAL  6. Patient will report >/= 30/80 on LEFS (MCID = 9 pts) to demonstrate improved functional ability. Baseline: 2 / 80 = 2.5 % Goal status: IN PROGRESS - 02/04/24 - 13 / 80 = 16.3 %  7.  Patient will demonstrate at least 19/24 on DGI to  decrease risk of falls. Baseline: NT on eval, 21/24 as of DC from PT on 07/09/23 Goal status: INITIAL   8.  Patient will improve 5x STS time to </= 25 seconds to demonstrate improved functional strength and transfer efficiency. Baseline: 47.84 sec - 01/16/24, 22.59 sec as of DC from PT on 07/09/23 Goal status: IN PROGRESS - 02/19/24 - 31.22 sec  9.  Patient will demonstrate decreased TUG time to </= 18 sec to decrease risk for falls with transitional mobility. Baseline: 01/16/24 - 44.75 sec with SPC, 13.5 sec w/o AD as of 05/03/23 Goal status: IN PROGRESS - 02/19/24 - 19.06 sec  10.  Patient will demonstrate gait speed of >/= 1.81 ft/sec to be a safe limited community ambulator with decreased risk for recurrent falls.  Baseline: 01/16/24 - 0.60 ft/sec with SPC, 2.78 ft/sec w/o AD as of 05/15/23 Goal status: IN PROGRESS - 02/19/24 - 1.71 ft/sec w/o AD; 1.50 ft/sec with SPC   PLAN:  PT FREQUENCY: 2x/week  PT DURATION: 12 weeks  PLANNED INTERVENTIONS: 97164- PT Re-evaluation, 97750- Physical Performance Testing, 97110-Therapeutic exercises, 97530- Therapeutic activity, 97112- Neuromuscular re-education, 97535- Self Care, 02859- Manual therapy, [phone]- Gait training, [phone]- Aquatic Therapy, [phone]- Electrical stimulation (unattended), 97016- Vasopneumatic device, [id number]- Ultrasound, [id number]- Ionotophoresis 4mg /ml Dexamethasone , 79439 (1-2 muscles), 20561 (3+ muscles)- Dry Needling, Patient/Family education, Balance training,  Stair training, Taping, Joint mobilization, DME instructions, Cryotherapy, and Moist heat  PLAN FOR NEXT SESSION: standardized balance testing - DGI; gently progress R knee ROM; LE strengthening with emphasis on quads and hip stability; review and update HEP as indicated; manual therapy and/or modalities PRN for pain management  Elijah CHRISTELLA Hidden, PT 02/26/2024, 2:09 PM     Date of referral: 12/20/2023 Referring provider: Charles Redell LABOR, DO Referring diagnosis? M17.11 (ICD-10-CM) - Primary osteoarthritis of right knee Treatment diagnosis? (if different than referring diagnosis)  Acute pain of right knee  Stiffness of right knee, not elsewhere classified  Muscle weakness (generalized)  Other abnormalities of gait and mobility  Unsteadiness on feet  What was this (referring dx) caused by? Arthritis  Nature of Condition: Initial Onset (within last 3 months)   Laterality: Rt  Current Functional Measure Score: LEFS 13 / 80 = 16.3 %  Objective measurements identify impairments when they are compared to normal values, the uninvolved extremity, and prior level of function.  [x]  Yes  []  No  Objective assessment of functional ability: Moderate functional limitations   Briefly describe symptoms:   [name] reports her R knee pain is much better today although pain rating only dropped from 9/10 last visit to 7-8/10 today.  She states her pain is no longer constant but still occurs on a frequent basis and still significantly impacts most daily activities.  R knee AROM improving to 11-104 with supported R knee extension down to 2, demonstrating significant gains from ROM observed at eval. LEFS demonstrating 11 point improvement from eval but still demonstrating severe functional limitation at 13/80.  [name] will benefit from continued skilled PT to address ongoing ROM, flexibility, abnormal muscle tension, strength and balance deficits to improve mobility and activity tolerance with  decreased pain interference and decreased risk for falls.   How did symptoms start: Sudden onset of pain and swelling in her R knee the day after she had been out with family  Average pain intensity:  Last 24 hours: 7/10  Past week: 7-9/10  How often does the pt experience symptoms? Frequently  How much have the symptoms interfered with usual  daily activities? Quite a bit  How has condition changed since care began at this facility? Better  In general, how is the patients overall health? Fair  Onset date: early October   BACK PAIN (STarT Back Screening Tool) - (When applicable): N/A  Has your back pain spread down your leg(s) at sometime in the last 2 weeks? []  Yes   []  No Have you had pain in the shoulder or neck at sometime in the past 2 weeks? []  Yes   []  No Have you only walked short distances because of your back pain? []  Yes   []  No In the past 2 weeks, have you dressed more slowly than usual because of your back pain? []  Yes   []  No Do you think it is not really safe for person with a condition like yours to be physically active? []  Yes   []  No Have worrying thoughts been going through your mind a lot of the time? []  Yes   []  No Do you feel that your back pain is terrible and it is never going to get any better? []  Yes   []  No In general, have you stopped enjoying all the things you usually enjoy? []  Yes   []  No Overall, how bothersome has your back pain been in the last 2 weeks? []  Not at all   []  Slightly     []  Moderate   []  Very much     []  Extremely   "

## 2024-02-27 NOTE — Telephone Encounter (Signed)
 Pt notified of note and to recheck in one month.  Lab only appointment made for 03/28/24.

## 2024-02-27 NOTE — Addendum Note (Signed)
 Addended by: ESTELLE GILLIS D on: 02/27/2024 02:55 PM   Modules accepted: Orders

## 2024-02-28 ENCOUNTER — Other Ambulatory Visit: Payer: Self-pay

## 2024-02-28 ENCOUNTER — Ambulatory Visit

## 2024-02-28 DIAGNOSIS — M6281 Muscle weakness (generalized): Secondary | ICD-10-CM | POA: Diagnosis not present

## 2024-02-28 DIAGNOSIS — R2681 Unsteadiness on feet: Secondary | ICD-10-CM

## 2024-02-28 DIAGNOSIS — M25661 Stiffness of right knee, not elsewhere classified: Secondary | ICD-10-CM

## 2024-02-28 DIAGNOSIS — M25561 Pain in right knee: Secondary | ICD-10-CM

## 2024-02-28 NOTE — Therapy (Signed)
 " OUTPATIENT PHYSICAL THERAPY TREATMENT    Patient Name: [name] MRN: [id number] DOB:09/29/1940, 84 y.o., female Today's Date: 02/28/2024   END OF SESSION:  PT End of Session - 02/28/24 1731     Visit Number 12    Date for Recertification  04/02/24    Authorization Type [employer]    Authorization Time Period 01/09/24-04/02/24    Authorization - Number of Visits 24    Progress Note Due on Visit 20    PT Start Time 1315    PT Stop Time 1400    PT Time Calculation (min) 45 min    Activity Tolerance Patient tolerated treatment well    Behavior During Therapy [facility] for tasks assessed/performed             Past Medical History:  Diagnosis Date   Abdominal pain in female 03/18/2010   Qualifier: Diagnosis of  By: Avram MD, NOLIA Pitts E    Anemia 06/08/2014   Anxiety    Arthritis    Spinal Osteoarthritis   Breast cancer (HCC)    Carcinoid tumor of stomach (HCC)    Cataract    Chest pain    Myoview  12/15 no ischemia.   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 09/05/2006   Qualifier: Diagnosis of  By: Wilhemina RMA, Lucy     Diabetic peripheral neuropathy (HCC) 10/29/2013   Encounter for preventative adult health care exam with abnormal findings 09/14/2013   Esophageal reflux    Gastric polyp    Fundic Gland   Gastroparesis    Headache(784.0)    Heart murmur    Echocardiogram 2/11: EF 60-65%, mild LAE, grade 1 diastolic dysfunction, aortic valve sclerosis, mean gradient 9 mm of mercury, PASP 34   Hematuria 03/16/2016   Iron  deficiency anemia, unspecified    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   Leg swelling    bilateral   Neck pain 04/22/2015   PONV (postoperative nausea and vomiting)    pt states body temperature drops every time she has anesthesia; pt states only needs small amount of anesthesia   PSVT (paroxysmal supraventricular tachycardia)    Pure hypercholesterolemia     Recurrent UTI 01/11/2016   Renal insufficiency 06/18/2019   pt states  L kidney function very low-functions at about 20%   Stroke [facility])    tia, 2014   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   Type II or unspecified type diabetes mellitus without mention of complication, not stated as uncontrolled    Unspecified essential hypertension    Unspecified hereditary and idiopathic peripheral neuropathy 10/29/2013   Vitamin D  deficiency    Past Surgical History:  Procedure Laterality Date   BREAST BIOPSY Right 06/13/2022   US  RT BREAST BX W LOC DEV 1ST LESION IMG BX SPEC US  GUIDE 06/13/2022 [facility] MAMMOGRAPHY   BREAST LUMPECTOMY Right 07/31/2022   Procedure: RIGHT BREAST LUMPECTOMY;  Surgeon: Vernetta Berg, MD;  Location: [facility];  Service: General;  Laterality: Right;   CHOLECYSTECTOMY  1993   COLONOSCOPY  11/11/2010   diverticulosis   DILATATION & CURRETTAGE/HYSTEROSCOPY WITH RESECTOCOPE N/A 02/25/2013   Procedure: Attempted hysteroscopy with uterine perforation;  Surgeon: Bobie FORBES Crown de Charlynn FORBES Cary, MD;  Location: [facility];  Service: Gynecology;  Laterality: N/A;   ESOPHAGOGASTRODUODENOSCOPY  08/29/2010; 09/15/2010   Carcinoid tumor less than 1 cm in July 2012 not seen in August 2012 , gastritis, fundic  gland polyps   ESOPHAGOGASTRODUODENOSCOPY  05/16/2011   ESOPHAGOGASTRODUODENOSCOPY  06/14/2012   EUS  12/15/2010   Procedure: UPPER ENDOSCOPIC ULTRASOUND (EUS) LINEAR;  Surgeon: Toribio Cedar, MD;  Location: [facility];  Service: Endoscopy;  Laterality: N/A;   EYE SURGERY Bilateral    Bi lateral cateracts and bi lateral laser   LAPAROSCOPY N/A 02/25/2013   Procedure: Cystoscopy and laparoscopy with fulguration of uterine serosa;  Surgeon: Bobie FORBES Crown de Charlynn FORBES Cary, MD;  Location: [facility];  Service: Gynecology;  Laterality: N/A;   TONSILLECTOMY     Patient Active Problem List   Diagnosis Date Noted   Microalbuminuria due to type 2 diabetes mellitus (HCC) 02/25/2024   Acute  pain of right knee 11/20/2023   Precordial chest pain 12/24/2022   History of breast cancer 07/04/2022   SVT (supraventricular tachycardia) 06/21/2022   Left-sided weakness 04/02/2022   Unsteady gait 04/02/2022   Oral lesion 03/08/2021   Sun-damaged skin 03/08/2021   Thiamine  deficiency 11/01/2020   Trigeminal neuralgia 08/21/2020   Pedal edema 08/21/2020   Chronic left-sided back pain 03/28/2020   Nocturia 03/28/2020   Left-sided headache 03/28/2020   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   Renal insufficiency 06/18/2019   Educated about COVID-19 virus infection 05/15/2019   Atrophic vaginitis 01/20/2019   Stenosis of carotid artery 11/12/2018   Anxiety 10/13/2018   Referred otalgia of left ear 04/23/2018   Chronic throat clearing 04/23/2018   Sinusitis 10/11/2017   Headache 07/12/2017   Dizzy spells 11/21/2016   Constipation 11/21/2016   Nonrheumatic aortic valve stenosis 05/23/2016   Aortic atherosclerosis 05/23/2016   Hematuria 03/16/2016   Recurrent UTI 01/11/2016   Pain of upper abdomen 06/06/2015   Neck pain 04/22/2015   Ear pain 02/28/2015   Abnormal urine 11/21/2014   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   Anemia 06/08/2014   RLS (restless legs syndrome) 11/23/2013   Diabetic peripheral neuropathy (HCC) 10/29/2013   Left-sided thoracic back pain 10/06/2013   Encounter for preventative adult health care exam with abnormal findings 09/14/2013   Iron  deficiency anemia    Status post laparoscopy 02/25/2013   Hyponatremia 01/09/2013   GERD (gastroesophageal reflux disease) 01/09/2013   Amaurosis fugax of left eye 10/16/2012   Low back pain 06/03/2012   Vitamin D  deficiency 03/11/2012   Bilateral hand pain 10/20/2011   Encounter for long-term (current) use of other medications 10/20/2011   IBS (irritable bowel syndrome) 08/14/2011   TIA (transient ischemic attack) 02/10/2011   Abnormal brain CT 01/19/2011   Allergic rhinitis 10/01/2010    Personal history of benign carcinoid tumor 09/29/2010   Preventative health care 07/15/2010   FUNDIC GLAND POLYPS OF STOMACH 03/18/2010   Abdominal pain in female 03/18/2010   Left hip pain 03/17/2009   SYSTOLIC MURMUR 03/02/2009   Paroxysmal supraventricular tachycardia 01/12/2009   PLANTAR FASCIITIS 06/08/2008   CHEST PAIN 05/18/2008   Gastroparesis 12/18/2007   HYPERCHOLESTEROLEMIA 06/11/2007   Type 2 diabetes mellitus in patient with obesity (HCC) 09/05/2006   Essential hypertension 09/05/2006    PCP: Domenica Harlene LABOR, MD   REFERRING PROVIDER: Charles Redell LABOR, DO  REFERRING DIAG: M17.11 (ICD-10-CM) - Primary osteoarthritis of right knee   THERAPY DIAG:  No diagnosis found.  RATIONALE FOR EVALUATION AND TREATMENT: Rehabilitation  ONSET DATE: early October  NEXT MD VISIT: TBD   SUBJECTIVE:  SUBJECTIVE STATEMENT: Patient reports pain continues to improve but stlll having pain directly under center of R patella  EVAL: Pt reports on Sunday in early October she was out for dinner with her family and the next day her knee was swollen and she could barely put weight on it.  She waited about 2 weeks before going to see the doctor.  MD drew some fluid off and gave her a cortisone shot which helped for ~1 week but now the pain is back and she feels crippled. Pain prevents her from walking much or going out.  Difficulty putting on her clothes and shoes.  She also notes a burning feeling in her knee and anterior leg.  She has variable swelling in her knee - better today after taking lasix  yesterday.  She states she has to use a cane at home but did not bring it with her to PT today.  She states now her R hip and back are also hurting due to limping for her knee.  Pain prevents her from  sleeping, esp on her sides, which is her preferred sleeping position.  PAIN: Are you having pain? Yes: NPRS scale: up to 4/10  Pain location: R anterior knee, lower thigh and upper lower leg  Pain description: sharp  Aggravating factors: walking, prolonged standing, lower body dressing  Relieving factors: Voltaren  gel, Tylenol , ice - nothing give much relief  PERTINENT HISTORY:  Extensive PMH including chronic back and neck pain, OA, osteoporosis, DM-II, HTN, h/o TIA, diabetic peripheral neuropathy, PVD, headaches, sleep dysfunction, anxiety, R breast cancer s/p lumpectomy 07/31/22, SVT (refer to above problem list and past medical/surgical history for full PMH)   PRECAUTIONS: Fall  RED FLAGS: None  WEIGHT BEARING RESTRICTIONS: No  FALLS:  Has patient fallen in last 6 months? No  LIVING ENVIRONMENT: Lives with: lives alone Lives in: House Stairs: No Has following equipment at home: Single point cane, shower chair, and Grab bars  OCCUPATION: Retired  PLOF: Independent, Needs assistance with homemaking, and Leisure: read and play card games on her phone, traveling, and housekeeping/cleaning service ~1x/month   PATIENT GOALS: Just to walk freely with no pain. Be able to climb stairs.   OBJECTIVE: (objective measures completed at initial evaluation unless otherwise dated)  DIAGNOSTIC FINDINGS:  11/27/23 - DG RIGHT KNEE - COMPLETE 4+ VIEW CLINICAL DATA:  Right knee pain. Primary osteoarthritis of right knee. Acute knee pain.   FINDINGS: The alignment and joint spaces are preserved. Mild tricompartmental peripheral spurring and spurring of the tibial spines. Small knee joint effusion. No fracture, erosion, or focal bone abnormality. Frontal view of the left knee included for comparison demonstrates tibiofemoral osteoarthritis.   IMPRESSION: Mild tricompartmental osteoarthritis of the right knee. Small knee joint effusion.  PATIENT SURVEYS:  LEFS  Extreme difficulty/unable  (0), Quite a bit of difficulty (1), Moderate difficulty (2), Little difficulty (3), No difficulty (4) Survey date:  01/09/24  02/04/24  Any of your usual work, housework or school activities 0 1  2. Usual hobbies, recreational or sporting activities 0 0  3. Getting into/out of the bath 1 1  4. Walking between rooms 1 3  5. Putting on socks/shoes 0 0  6. Squatting  0 0  7. Lifting an object, like a bag of groceries from the floor 0 1  8. Performing light activities around your home 0 1  9. Performing heavy activities around your home 0 0  10. Getting into/out of a car 0 0  11. Walking  2 blocks 0 0  12. Walking 1 mile 0 0  13. Going up/down 10 stairs (1 flight) 0 0  14. Standing for 1 hour 0 1  15.  sitting for 1 hour 0 4  16. Running on even ground 0 0  17. Running on uneven ground 0 0  18. Making sharp turns while running fast 0 0  19. Hopping  0 0  20. Rolling over in bed 0 1  Score total:  2/80 13/80  Functional limitation: Severe Severe     COGNITION: Overall cognitive status: Within functional limits for tasks assessed    SENSATION: WFL Pt reports burning and itching in her knee at times  EDEMA:  Circumferential:   Mid patella: R = 46.0 cm L = 43.5 cm  6 cm above: R = 49.0 cm L = 45.0 cm  6 cm below: R = 43.0 cm L = 40.0 cm  POSTURE:  rounded shoulders, forward head, and weight shift left  PALPATION: Very TTP over patella and anterior knee - increased edema apparent with decreased patellar mobility and poor quad activation (especially VMO) with quad set  MUSCLE LENGTH: TBA next visit Hamstrings:  ITB:  Piriformis:  Hip flexors:  Quads:  Heelcord:   LOWER EXTREMITY ROM:  Active ROM Right eval Left eval R 02/04/24 RLE R   02/19/24  Knee flexion 82 120 104 105 107  Knee extension 18 LAQ /  8 supported 0 11 LAQ /  2 supported -2 to 3 degrees in supine 8 LAQ /  2 supported  (Blank rows = not tested)  LOWER EXTREMITY MMT: (Tested in sitting on  eval)  MMT Right eval Left eval R 02/19/24 L 02/19/24  Hip flexion 3 3+ 3+ 4  Hip extension 2 3- 2+ 3-  Hip abduction 3+ 4- 4- 4  Hip adduction 3+ 4- 4- 4  Hip internal rotation   4 4+  Hip external rotation   3+ 4-  Knee flexion 3+ 4- 4- 4  Knee extension 3- 4 4- 4  Ankle dorsiflexion 3+ 4- 4- 4  Ankle plantarflexion      Ankle inversion      Ankle eversion       (Blank rows = not tested)  FUNCTIONAL TESTS: 01/16/24 5 times sit to stand: 47.84 sec  Timed up and go (TUG): 44.75 sec with SPC  10 meter walk test: 54.47 sec with SPC  Gait speed = 0.60 ft/sec with SPC  GAIT: Distance walked: 50' Assistive device utilized: Single point cane and None Level of assistance: SBA Gait pattern: step to pattern, decreased step length- Left, decreased stride length, decreased hip/knee flexion- Right, antalgic, and lateral lean- Left Comments: Increased instability evident with patient reporting she uses SPC at home but did not bring it with her to PT   TODAY'S TREATMENT:  02/28/24:  Manual:  Supine for AP glides R tibia 3 bouts 30 sec to improve R knee flexion motion Supine with R knee distraction at ankle to stretch and improve R knee extension posture Seated for isometric R knee extension at multiple angles to improve pain R infrapatellar tendon  Therapeutic Activity Standing with red t band for abd and ext \ Standing for marching with red t band around thighs, pain with placement of R heel on floor elicited with this ex Mini squats with B heels on pads to eccentrically load B infrapatellar tendons Supine for SLR R 20 reps  Manual asisted stretching in supine for R  hip adductors 5 x 10 sec   After session added moist heat R hip x 8 min   02/26/2024  THERAPEUTIC EXERCISE: To improve strength, endurance, ROM, and flexibility.  Demonstration, verbal and tactile cues throughout for technique.  Rec bike - full revolutions to L1 x 6' Seated hip adductor stretch 2 x 30 B Seated RTB hip  ABD/ER clam 2 x 10  NEUROMUSCULAR RE-EDUCATION: To improve strength, coordination, kinesthesia, posture, proprioception, and balance. Standing hip ABD with looped RTB at distal thighs x 10 B Standing hip ext with looped RTB at distal thighsx 10 B Standing hip ABD/ext diagonal with looped RTB at distal thighs x 10 B - pt reports slight increased pain in R medial thigh Standing hip flexion march + hip ER with looped RTB at distal thighs x 8 B Standing hip extension + hip ER with looped RTB at distal thighs x 10 B Seated Fitter (1 black/1 blue) leg press 2 x 10 B Seated RTB leg press 2 x 10 B   02/19/24 MANUAL THERAPY: To promote normalized muscle tension, improved flexibility, and reduced pain utilizing connective tissue massage, therapeutic massage, manual TP therapy, and myofascial release.  STM/DTM and IASTM with edge tool and roller stick to R glutes, piriformis, TLF, distal hip flexors, proximal lateral quads, and medial HS  THERAPEUTIC EXERCISE: To improve strength and endurance.  Demonstration, verbal and tactile cues throughout for technique. Hooklying R SLR 2 x 5 S/L R clam x 10 S/L R hip ABD x 10 (slight PT assisted AAROM) Standing R hip ABD x 10 Standing R hip ABD/ext x 10 - pt reports increased pain in R medial knee Standing R hip ext x 10  THERAPEUTIC ACTIVITIES: To improve functional performance.  Demonstration, verbal and tactile cues throughout for technique.  5xSTS = 19.06 sec TUG = 31.22 sec : 19.22 sec w/o AD 21.88 sec with SPC on L Gait speed: 1.71 ft/sec w/o AD 1.50 ft/sec with SPC  SELF CARE:  Review of progress with PT, status of goals, and plans for ongoing PT POC requesting patient input and feedback to ensure PT is addressing all of her concerns.    02/14/24 NuStep L4 6 minutes (UE/LE)  THERAPEUTIC EXERCISE  All the following exercises were performed to fatigue: Standing alt hip abduct, extension Seated resisted alt abduction red band  Seated  Marches with red band  Seated alt resisted ankle pumps with blue band Seated alt hip flexion with knee extended Glute bridges with bolster under calf  Alt step ups on 6in block against the counter with light guarding hands free  THERAPEUTIC EXERCISE: To improve strength, and increased endurance to work towards pt goal of walking up 6 steps at family visits. Also ankle pumps with band resistance to increase blood flow to help reduce swelling lower legs  PT EDUCATION: Verbally and physically guided pt to step with L foot first when ascending stairs when she goes to visit family. Advised pt to continue HEP to help with swelling and pain in knee.    02/12/24: somewhat abbreviated session as pt  7 min late: Supine , Knees over bolster, deep tissue massage R distal lateral quads , Itband Nustep L 4, x 5 min  Standing with 2 # cuff wts ankles for alt hip abd, alt hip ext ,alt hamstring curls, 2 x 10 each Seated marches alt with red t band 2 x 10 Seated SLR 2 x 10 each leg    02/06/24 THERAPEUTIC EXERCISE: To improve strength,  endurance, and ROM.  Demonstration, verbal and tactile cues throughout for technique.  NuStep - L3 x 6' (UE/LE)  Standing hip abd 2# x 15 RLE Standing hip ext x2# x 15 RLE Standing knee flexion 2# x 15 RLE Seated knee flexion GTB x 2/10 RLE Seated hip abd/ER GTB x 20 BLE Supine bent knee fallouts GTB x 20 BLE Supine SLR ham stretch w/ PT assist x 1' x 3 RLE Supine cross over piriformis stretch x 1' x 3 RLE SL clam x 2/10 RLE Circumference measures at mid patella:  L knee = 42.5 cm;   R knee = 44 cm  NEUROMUSCULAR RE-EDUCATION: To improve strength and balance. Sidestepping GTB  to knees x 2 laps at counter  MANUAL THERAPY: To promote reduced pain utilizing myofascial release. MFR to R lateral hamstrings in SLR stretch position; MFR to R distal IT in sidelying via knuckle massage   02/04/2024  THERAPEUTIC EXERCISE: To improve strength, endurance, and ROM.   Demonstration, verbal and tactile cues throughout for technique.  NuStep - L2 x 6' (UE/LE)  SELF CARE:   Provided instruction in application of Galvaran Knee Brace ([email or web address]) which patient's sister recommended to her and she brought with her to the therapy session.  Brace seemed a little snug and in the short trial of wearing it during therapy session seem to create some friction irritation along the edges of the brace and the seems with patient not really noting any difference in knee pain while wearing the brace, therefore recommended that she would probably be better off not using the brace.  MANUAL THERAPY: To promote normalized muscle tension, improved flexibility, improved joint mobility, increased ROM, and reduced pain utilizing connective tissue massage, therapeutic massage, manual TP therapy, and percussion massage with massage gun.  STM/DTM and percussion massage with massage gun to R glutes, piriformis, ITB, lateral quads and hamstrings  THERAPEUTIC ACTIVITIES: To improve functional performance.  Demonstration, verbal and tactile cues throughout for technique. LEFS: 13 / 80 = 16.3 % R knee ROM assessment - see ROM table above   01/29/24: Supine for AP jt mobs L prox tibia with thigh over bolster, 2 bouts 45 sec each, gentle, to improve R knee flexion motion  Mob with movement, lat glides R hip while assisting with R hip IR/ER and flex Side lying L with R thigh over 6 roll, for theragun massage R lateral hip musculature Supine with bolster under knees , manually resisted R hip IR, and then manually resisted R hip IR , 15 reps each  Ankle pumps with theraband for R ankle plantarflexion motion.   01/22/24 Nustep L1x94min UE/LE Seated R LE PF GTB X 10 Seated hip ABD X 10 B Seated ball squeeze x 10  Seated ball squeeze + LAQ x 10 Seated R hamstring stretch 2x30 sec Seated R adductor stretch 2x30 sec Seated R foot on 9' stool piriformis stretch 2x30  sec   01/18/24 THERAPEUTIC EXERCISE: To improve strength, endurance, ROM, and flexibility.  Demonstration, verbal and tactile cues throughout for technique.  NuStep - L3 x 6' (UE/LE) Seated hip ADD isometric ball squeeze 10 x 5 Seated R hip adductor stretch 2 x 30 Seated hip ADD ball squeeze + R LAQ x 10 Seated hip hinge R HS stretch 2 x 30 Seated R knee flexion/extension heel slides on slider 2 x 10 Supine manual L hip adductor stretch 2 x 30 Supine manual L HS stretch 2 x 30 Supine manual L ITB stretch 2 x 30  Supine R quad set into towel roll 10 x 5  MANUAL THERAPY: To promote normalized muscle tension, improved flexibility, improved joint mobility, increased ROM, reduced pain, and reduced edema utilizing joint mobilization, connective tissue massage, therapeutic massage, manual TP therapy, and kinesiotaping. Kinesiotaping to R knee (blue Kinesiotex Classic) - chondromalacia pattern with slight increased stretch on lateral strip to promote medial patellar glide R patellar mobilization all directions - very limited mobility with some increased pain reported at points of PT pressure STM/DTM and manual TPR to R hip adductors, quads and ITB   01/16/24  THERAPEUTIC ACTIVITIES: To improve functional performance.  Demonstration, verbal and tactile cues throughout for technique. 5xSTS = 47.84 sec TUG = 44.75 sec with SPC = 54.47 sec with SPC Gait speed = 0.60 ft/sec with SPC  GAIT TRAINING: To normalize gait pattern and improve safety with SPC. 11' with SPC and SBA of PT - repeated verbal and visual cues/demonstration for step through pattern and coordination of SPC with R LE, however patient continues to demonstrate step to pattern  THERAPEUTIC EXERCISE: To improve strength, ROM, and flexibility.  Demonstration, verbal and tactile cues throughout for technique. Supine quad set on towel roll 10 x 5 Supine hip ABD/ADD x 10 Manual HS stretch by PT 2 x 30 Mod thomas quad/hip  flexor stretch 2 x 30-60 Attempted R hip ADD butterfly stretch but deferred d/t increased pain  B HS curl/AAROM R knee flexion with heels on peanut ball and strap assist for R LE 2 x 10  MANUAL THERAPY: To promote normalized muscle tension, improved flexibility, improved joint mobility, increased ROM, and reduced pain utilizing joint mobilization, connective tissue massage, therapeutic massage, and kinesiotaping.  STM and gentle IASTM with foam roller to R quads, hip adductors and ITB R patellar mobilization all directions - very limited mobility however able to reduce pain with heel slides when PT providing medial patellar glide Kinesiotaping to R knee (sensitive skin tape - light blue Kinesiotex Gold) - chondromalacia pattern with slight increased stretch on lateral strip to promote medial patellar glide  MODALITIES:  Ice pack to R knee x 10' in sitting with LE elevated on 9 stool to reduce post-exercise pain and edema   01/09/24 - Eval SELF CARE:   Reviewed eval findings and role of PT in addressing identified deficits as well as instruction in initial HEP (see below).  Provided instruction in gait training with SPC in L hand to offset R knee.  Patient only able to demonstrate step to pattern despite repeated verbal cues and demonstration of step through pattern.  Further training indicated next visit. Discussed taking her lasix  more frequently (prescribed for daily PRN) to help with LE edema as she notes slight improvement in LE edema and knee pain after she takes the lasix .  Also explained that her potassium supplement should be taken with the lasix  to offset the loss of potassium as a result of the lasix .    PATIENT EDUCATION:  Education details: HEP update - RTB seated leg press  Person educated: Patient Education method: Explanation, Demonstration, Verbal cues, Tactile cues, and Handouts Education comprehension: verbalized understanding, returned demonstration, verbal cues required,  tactile cues required, and needs further education  HOME EXERCISE PROGRAM: Access Code: [email or web address] URL: [email or web address] Date: 02/26/2024 Prepared by: Elijah Hidden  Exercises - Supine Quad Set on Towel Roll  - 2-3 x daily - 7 x weekly - 2 sets - 10 reps - 5 sec hold - Seated Heel Slide  - 2-3  x daily - 7 x weekly - 2 sets - 10 reps - 3 sec hold - Seated Hip Adduction Isometrics with Ball  - 2 x daily - 7 x weekly - 2 sets - 10 reps - 3-5 sec hold - Seated Long Arc Quad with Hip Adduction  - 2 x daily - 7 x weekly - 2 sets - 10 reps - 3 sec hold - Seated Hip Adductor Stretch  - 2 x daily - 7 x weekly - 3 reps - 30 sec hold - Seated Hamstring Stretch  - 2 x daily - 7 x weekly - 3 reps - 30 sec hold - Clamshell  - 1 x daily - 7 x weekly - 1 sets - 10 reps - Seated Hip Abduction with Resistance  - 1 x daily - 7 x weekly - 2 sets - 10 reps - Supine Piriformis Stretch  - 1 x daily - 7 x weekly - 1 sets - 2 reps - 1 min hold - Seated Hamstring Curl with Anchored Resistance  - 1 x daily - 7 x weekly - 2 sets - 10 reps - Seated Leg Press with Resistance  - 1 x daily - 7 x weekly - 2 sets - 10 reps - 3 sec hold   ASSESSMENT:  CLINICAL IMPRESSION: Ahlam reports her R knee pain remains less intense and she did not use her cane today for her gait into clinic.  Pain R ant knee with engagement of hip flexors noted so attempted other manual techniques and therex to improve . She tolerated well.   Mallika will benefit from continued skilled PT to address ongoing abnormal muscle tension, flexibility, strength, and balance deficits to improve mobility and activity tolerance with decreased pain interference and decreased risk for falls.   EVAL: Mahati N Leveque is a 84 y.o. female who was referred to physical therapy for evaluation and treatment for acute R knee pain secondary to osteoarthritis.  Patient reports onset of R knee pain beginning in early October without known injury or  precipitating event, other than having gone out to dinner with her family the night before.  Pain is worse with any standing or walking, as well as lower body dressing.  Patient has deficits in R knee ROM, B LE flexibility, B LE strength, abnormal posture, antalgic gait pattern with evidence of gait instability, and TTP with abnormal muscle tension which are interfering with ADLs and are impacting quality of life.  On LEFS patient scored 2/80 demonstrating severe functional limitation.  Further standardized balance testing to be completed next visit.  Chisa will benefit from skilled PT to address above deficits to improve mobility and activity tolerance with decreased pain interference.  OBJECTIVE IMPAIRMENTS: Abnormal gait, decreased activity tolerance, decreased balance, decreased endurance, decreased knowledge of condition, decreased knowledge of use of DME, decreased mobility, difficulty walking, decreased ROM, decreased strength, decreased safety awareness, increased edema, increased fascial restrictions, impaired perceived functional ability, increased muscle spasms, impaired flexibility, improper body mechanics, postural dysfunction, and pain.   ACTIVITY LIMITATIONS: carrying, lifting, bending, sitting, standing, squatting, sleeping, stairs, transfers, bed mobility, bathing, dressing, and locomotion level  PARTICIPATION LIMITATIONS: meal prep, cleaning, laundry, driving, shopping, community activity, and church  PERSONAL FACTORS: Age, Behavior pattern, Fitness, Past/current experiences, Time since onset of injury/illness/exacerbation, and 3+ comorbidities: Extensive PMH including chronic back and neck pain, OA, osteoporosis, DM-II, HTN, h/o TIA, diabetic peripheral neuropathy, PVD, headaches, sleep dysfunction, anxiety, R breast cancer s/p lumpectomy 07/31/22, SVT  are also affecting  patient's functional outcome.   REHAB POTENTIAL: Good  CLINICAL DECISION MAKING: Evolving/moderate  complexity  EVALUATION COMPLEXITY: Moderate   GOALS: Goals reviewed with patient? Yes  SHORT TERM GOALS: Target date: 02/20/2024  Patient will be independent with initial HEP. Baseline: HEP initiated on eval 01/16/24 - Limited review today 01/18/24 - full HEP review with slight modification/update 02/04/24 - Pt denies concerns, but not formally reviewed Goal status: MET - 02/19/24  2.  Patient will report at least 25% improvement in R knee pain to improve QOL. Baseline: 10/10 Goal status: MET - 02/04/24 - 30-40% improvement in R knee pain  3.  Patient will demonstrate improved R knee AROM to >/= 10-95 to allow for normal gait and stair mechanics. Baseline: Refer to above LE ROM table - R knee AROM 18-82, with 8 extension lacking when knee supported  02/04/24: R knee AROM 11-104, with 2 lacking in supported extension Goal status: MET - 02/19/24 - R knee AROM 8-107, with 2 lacking in supported extension  4.  Complete standardized balance testing and update POC/goals as indicated. Baseline:  Goal status: MET - 01/16/24 - 5xSTS, TUG and completed   LONG TERM GOALS: Target date: 04/02/2024  Patient will be independent with advanced/ongoing HEP to improve outcomes and carryover.  Baseline: 02/06/24 advanced and provided written handouts on new exercises Goal status: IN PROGRESS - 02/26/24 - HEP updated  2.  Patient will report at least 50% improvement in R knee pain to improve QOL. Baseline: 10/10 02/06/24:  7/10 Goal status: IN PROGRESS - 02/19/24 - 60% improved  3.  Patient will demonstrate improved R knee AROM to >/= 5-110 to allow for normal gait and stair mechanics. Baseline: Refer to above LE ROM table - R knee AROM 18-82, with 8 extension lacking when knee supported  02/06/24:  see updated tables Goal status: IN PROGRESS - 02/19/24 - R knee AROM 8-107, with 2 lacking in supported extension  4.  Patient will demonstrate improved B LE strength to >/= 4 to 4+/5  for improved stability and ease of mobility. Baseline: Refer to above LE MMT table Goal status: IN PROGRESS - 02/19/24 - R LE remains weaker than L, esp proximally  5.  Patient will be able to ambulate 600' with LRAD and normal gait pattern without increased pain to access community.  Baseline: Antalgic step to pattern with SPC on L, worsening antalgic gait pattern without AD Goal status: INITIAL  6. Patient will report >/= 30/80 on LEFS (MCID = 9 pts) to demonstrate improved functional ability. Baseline: 2 / 80 = 2.5 % Goal status: IN PROGRESS - 02/04/24 - 13 / 80 = 16.3 %  7.  Patient will demonstrate at least 19/24 on DGI to decrease risk of falls. Baseline: NT on eval, 21/24 as of DC from PT on 07/09/23 Goal status: INITIAL   8.  Patient will improve 5x STS time to </= 25 seconds to demonstrate improved functional strength and transfer efficiency. Baseline: 47.84 sec - 01/16/24, 22.59 sec as of DC from PT on 07/09/23 Goal status: IN PROGRESS - 02/19/24 - 31.22 sec  9.  Patient will demonstrate decreased TUG time to </= 18 sec to decrease risk for falls with transitional mobility. Baseline: 01/16/24 - 44.75 sec with SPC, 13.5 sec w/o AD as of 05/03/23 Goal status: IN PROGRESS - 02/19/24 - 19.06 sec  10.  Patient will demonstrate gait speed of >/= 1.81 ft/sec to be a safe limited community ambulator with decreased risk for  recurrent falls.  Baseline: 01/16/24 - 0.60 ft/sec with SPC, 2.78 ft/sec w/o AD as of 05/15/23 Goal status: IN PROGRESS - 02/19/24 - 1.71 ft/sec w/o AD; 1.50 ft/sec with SPC   PLAN:  PT FREQUENCY: 2x/week  PT DURATION: 12 weeks  PLANNED INTERVENTIONS: 97164- PT Re-evaluation, 97750- Physical Performance Testing, 97110-Therapeutic exercises, 97530- Therapeutic activity, 97112- Neuromuscular re-education, 97535- Self Care, 02859- Manual therapy, [phone]- Gait training, [phone]- Aquatic Therapy, [phone]- Electrical stimulation (unattended), 97016- Vasopneumatic device, [id number]-  Ultrasound, [id number]- Ionotophoresis 4mg /ml Dexamethasone , 79439 (1-2 muscles), [id number] (3+ muscles)- Dry Needling, Patient/Family education, Balance training, Stair training, Taping, Joint mobilization, DME instructions, Cryotherapy, and Moist heat  PLAN FOR NEXT SESSION: standardized balance testing - DGI; gently progress R knee ROM; LE strengthening with emphasis on quads and hip stability; review and update HEP as indicated; manual therapy and/or modalities PRN for pain management  Deitra Craine L Nolton Denis, PT 02/28/2024, 5:34 PM     Date of referral: 12/20/2023 Referring provider: Charles Redell LABOR, DO Referring diagnosis? M17.11 (ICD-10-CM) - Primary osteoarthritis of right knee Treatment diagnosis? (if different than referring diagnosis)  No diagnosis found.  What was this (referring dx) caused by? Arthritis  Nature of Condition: Initial Onset (within last 3 months)   Laterality: Rt  Current Functional Measure Score: LEFS 13 / 80 = 16.3 %  Objective measurements identify impairments when they are compared to normal values, the uninvolved extremity, and prior level of function.  [x]  Yes  []  No  Objective assessment of functional ability: Moderate functional limitations   Briefly describe symptoms:   Don reports her R knee pain is much better today although pain rating only dropped from 9/10 last visit to 7-8/10 today.  She states her pain is no longer constant but still occurs on a frequent basis and still significantly impacts most daily activities.  R knee AROM improving to 11-104 with supported R knee extension down to 2, demonstrating significant gains from ROM observed at eval. LEFS demonstrating 11 point improvement from eval but still demonstrating severe functional limitation at 13/80.  Deerica will benefit from continued skilled PT to address ongoing ROM, flexibility, abnormal muscle tension, strength and balance deficits to improve mobility and activity tolerance with decreased  pain interference and decreased risk for falls.   How did symptoms start: Sudden onset of pain and swelling in her R knee the day after she had been out with family  Average pain intensity:  Last 24 hours: 7/10  Past week: 7-9/10  How often does the pt experience symptoms? Frequently  How much have the symptoms interfered with usual daily activities? Quite a bit  How has condition changed since care began at this facility? Better  In general, how is the patients overall health? Fair  Onset date: early October   BACK PAIN (STarT Back Screening Tool) - (When applicable): N/A  Has your back pain spread down your leg(s) at sometime in the last 2 weeks? []  Yes   []  No Have you had pain in the shoulder or neck at sometime in the past 2 weeks? []  Yes   []  No Have you only walked short distances because of your back pain? []  Yes   []  No In the past 2 weeks, have you dressed more slowly than usual because of your back pain? []  Yes   []  No Do you think it is not really safe for person with a condition like yours to be physically active? []  Yes   []  No Have worrying thoughts  been going through your mind a lot of the time? []  Yes   []  No Do you feel that your back pain is terrible and it is never going to get any better? []  Yes   []  No In general, have you stopped enjoying all the things you usually enjoy? []  Yes   []  No Overall, how bothersome has your back pain been in the last 2 weeks? []  Not at all   []  Slightly     []  Moderate   []  Very much     []  Extremely   "

## 2024-03-04 ENCOUNTER — Ambulatory Visit: Admitting: Physical Therapy

## 2024-03-06 ENCOUNTER — Ambulatory Visit

## 2024-03-11 ENCOUNTER — Ambulatory Visit: Admitting: Physical Therapy

## 2024-03-13 ENCOUNTER — Ambulatory Visit

## 2024-03-18 ENCOUNTER — Ambulatory Visit: Admitting: Physical Therapy

## 2024-03-20 ENCOUNTER — Ambulatory Visit

## 2024-03-25 ENCOUNTER — Ambulatory Visit: Admitting: Physical Therapy

## 2024-03-26 ENCOUNTER — Inpatient Hospital Stay: Admitting: Hematology & Oncology

## 2024-03-26 ENCOUNTER — Inpatient Hospital Stay: Attending: Hematology & Oncology

## 2024-03-27 ENCOUNTER — Ambulatory Visit: Admitting: Physical Therapy

## 2024-03-28 ENCOUNTER — Other Ambulatory Visit

## 2024-04-01 ENCOUNTER — Ambulatory Visit: Admitting: Physical Therapy

## 2024-07-01 ENCOUNTER — Other Ambulatory Visit

## 2024-07-07 ENCOUNTER — Ambulatory Visit: Admitting: Family Medicine

## 2024-10-28 ENCOUNTER — Ambulatory Visit
